# Patient Record
Sex: Female | Born: 1945
Health system: Southern US, Community
[De-identification: ages and names within clinical notes are randomized; demographics above are authoritative.]

## PROBLEM LIST (undated history)

## (undated) DIAGNOSIS — Z923 Personal history of irradiation: Secondary | ICD-10-CM

## (undated) DIAGNOSIS — T4145XA Adverse effect of unspecified anesthetic, initial encounter: Secondary | ICD-10-CM

## (undated) DIAGNOSIS — C7951 Secondary malignant neoplasm of bone: Secondary | ICD-10-CM

## (undated) DIAGNOSIS — C50919 Malignant neoplasm of unspecified site of unspecified female breast: Secondary | ICD-10-CM

## (undated) DIAGNOSIS — Z9889 Other specified postprocedural states: Secondary | ICD-10-CM

## (undated) DIAGNOSIS — G4700 Insomnia, unspecified: Secondary | ICD-10-CM

## (undated) DIAGNOSIS — T8859XA Other complications of anesthesia, initial encounter: Secondary | ICD-10-CM

## (undated) DIAGNOSIS — C4490 Unspecified malignant neoplasm of skin, unspecified: Secondary | ICD-10-CM

## (undated) DIAGNOSIS — Z1379 Encounter for other screening for genetic and chromosomal anomalies: Secondary | ICD-10-CM

## (undated) DIAGNOSIS — I10 Essential (primary) hypertension: Secondary | ICD-10-CM

## (undated) DIAGNOSIS — R350 Frequency of micturition: Secondary | ICD-10-CM

## (undated) DIAGNOSIS — R42 Dizziness and giddiness: Secondary | ICD-10-CM

## (undated) DIAGNOSIS — D649 Anemia, unspecified: Secondary | ICD-10-CM

## (undated) DIAGNOSIS — H269 Unspecified cataract: Secondary | ICD-10-CM

## (undated) DIAGNOSIS — Z87440 Personal history of urinary (tract) infections: Secondary | ICD-10-CM

## (undated) DIAGNOSIS — F419 Anxiety disorder, unspecified: Secondary | ICD-10-CM

## (undated) DIAGNOSIS — K649 Unspecified hemorrhoids: Secondary | ICD-10-CM

## (undated) DIAGNOSIS — E785 Hyperlipidemia, unspecified: Secondary | ICD-10-CM

## (undated) DIAGNOSIS — R112 Nausea with vomiting, unspecified: Secondary | ICD-10-CM

## (undated) DIAGNOSIS — M25561 Pain in right knee: Secondary | ICD-10-CM

## (undated) DIAGNOSIS — M25511 Pain in right shoulder: Secondary | ICD-10-CM

## (undated) DIAGNOSIS — A048 Other specified bacterial intestinal infections: Secondary | ICD-10-CM

## (undated) DIAGNOSIS — Z8719 Personal history of other diseases of the digestive system: Secondary | ICD-10-CM

## (undated) DIAGNOSIS — G629 Polyneuropathy, unspecified: Secondary | ICD-10-CM

## (undated) DIAGNOSIS — K219 Gastro-esophageal reflux disease without esophagitis: Secondary | ICD-10-CM

## (undated) HISTORY — DX: Pain in right shoulder: M25.511

## (undated) HISTORY — DX: Unspecified malignant neoplasm of skin, unspecified: C44.90

## (undated) HISTORY — DX: Gastro-esophageal reflux disease without esophagitis: K21.9

## (undated) HISTORY — DX: Pain in right knee: M25.561

## (undated) HISTORY — DX: Encounter for other screening for genetic and chromosomal anomalies: Z13.79

## (undated) HISTORY — DX: Personal history of irradiation: Z92.3

## (undated) HISTORY — DX: Secondary malignant neoplasm of bone: C79.51

## (undated) HISTORY — PX: ESOPHAGOGASTRODUODENOSCOPY: SHX1529

## (undated) HISTORY — PX: MASTECTOMY, RADICAL: SHX710

## (undated) HISTORY — DX: Personal history of urinary (tract) infections: Z87.440

## (undated) HISTORY — PX: COLONOSCOPY: SHX174

## (undated) HISTORY — DX: Malignant neoplasm of unspecified site of unspecified female breast: C50.919

---

## 1968-05-09 HISTORY — PX: OTHER SURGICAL HISTORY: SHX169

## 1999-04-20 ENCOUNTER — Encounter: Payer: Self-pay | Admitting: *Deleted

## 1999-04-20 ENCOUNTER — Ambulatory Visit (HOSPITAL_COMMUNITY): Admission: RE | Admit: 1999-04-20 | Discharge: 1999-04-20 | Payer: Self-pay | Admitting: *Deleted

## 2002-05-09 HISTORY — PX: OTHER SURGICAL HISTORY: SHX169

## 2003-05-21 ENCOUNTER — Emergency Department (HOSPITAL_COMMUNITY): Admission: EM | Admit: 2003-05-21 | Discharge: 2003-05-21 | Payer: Self-pay | Admitting: Emergency Medicine

## 2003-09-18 ENCOUNTER — Other Ambulatory Visit: Admission: RE | Admit: 2003-09-18 | Discharge: 2003-09-18 | Payer: Self-pay | Admitting: Family Medicine

## 2004-08-25 ENCOUNTER — Other Ambulatory Visit: Admission: RE | Admit: 2004-08-25 | Discharge: 2004-08-25 | Payer: Self-pay | Admitting: Family Medicine

## 2005-10-24 ENCOUNTER — Other Ambulatory Visit: Admission: RE | Admit: 2005-10-24 | Discharge: 2005-10-24 | Payer: Self-pay | Admitting: Family Medicine

## 2006-09-27 ENCOUNTER — Other Ambulatory Visit: Admission: RE | Admit: 2006-09-27 | Discharge: 2006-09-27 | Payer: Self-pay | Admitting: Family Medicine

## 2007-10-03 ENCOUNTER — Other Ambulatory Visit: Admission: RE | Admit: 2007-10-03 | Discharge: 2007-10-03 | Payer: Self-pay | Admitting: Family Medicine

## 2008-10-03 ENCOUNTER — Other Ambulatory Visit: Admission: RE | Admit: 2008-10-03 | Discharge: 2008-10-03 | Payer: Self-pay | Admitting: Family Medicine

## 2011-10-20 ENCOUNTER — Other Ambulatory Visit (HOSPITAL_COMMUNITY)
Admission: RE | Admit: 2011-10-20 | Discharge: 2011-10-20 | Disposition: A | Discharge status: Home / Self Care | Payer: Medicare Other | Source: Ambulatory Visit | Attending: Family Medicine | Admitting: Family Medicine

## 2011-10-20 DIAGNOSIS — Z124 Encounter for screening for malignant neoplasm of cervix: Secondary | ICD-10-CM | POA: Insufficient documentation

## 2011-10-21 ENCOUNTER — Other Ambulatory Visit: Payer: Self-pay | Admitting: Family Medicine

## 2012-03-05 ENCOUNTER — Other Ambulatory Visit: Payer: Self-pay | Admitting: Radiology

## 2012-03-06 ENCOUNTER — Other Ambulatory Visit: Payer: Self-pay | Admitting: Radiology

## 2012-03-06 DIAGNOSIS — C50911 Malignant neoplasm of unspecified site of right female breast: Secondary | ICD-10-CM

## 2012-03-08 ENCOUNTER — Telehealth: Payer: Self-pay | Admitting: *Deleted

## 2012-03-08 DIAGNOSIS — C50411 Malignant neoplasm of upper-outer quadrant of right female breast: Secondary | ICD-10-CM | POA: Insufficient documentation

## 2012-03-08 DIAGNOSIS — C50419 Malignant neoplasm of upper-outer quadrant of unspecified female breast: Secondary | ICD-10-CM

## 2012-03-08 NOTE — Telephone Encounter (Signed)
Confirmed [facility] for 03/14/12 at 1200 .  Instructions and contact information given.

## 2012-03-12 ENCOUNTER — Ambulatory Visit
Admission: RE | Admit: 2012-03-12 | Discharge: 2012-03-12 | Disposition: A | Discharge status: Home / Self Care | Payer: Medicare Other | Source: Ambulatory Visit | Attending: Radiology | Admitting: Radiology

## 2012-03-12 DIAGNOSIS — C50911 Malignant neoplasm of unspecified site of right female breast: Secondary | ICD-10-CM

## 2012-03-12 MED ORDER — GADOBENATE DIMEGLUMINE 529 MG/ML IV SOLN
12.0000 mL | Freq: Once | INTRAVENOUS | Status: AC | PRN
  Administered 2012-03-12: 12 mL via INTRAVENOUS

## 2012-03-14 ENCOUNTER — Encounter: Payer: Self-pay | Admitting: Oncology

## 2012-03-14 ENCOUNTER — Other Ambulatory Visit (HOSPITAL_BASED_OUTPATIENT_CLINIC_OR_DEPARTMENT_OTHER): Payer: Medicare Other | Admitting: Lab

## 2012-03-14 ENCOUNTER — Ambulatory Visit
Admission: RE | Admit: 2012-03-14 | Discharge: 2012-03-14 | Disposition: A | Discharge status: Home / Self Care | Payer: Medicare Other | Source: Ambulatory Visit | Attending: Radiation Oncology | Admitting: Radiation Oncology

## 2012-03-14 ENCOUNTER — Ambulatory Visit (HOSPITAL_BASED_OUTPATIENT_CLINIC_OR_DEPARTMENT_OTHER): Payer: Medicare Other | Admitting: General Surgery

## 2012-03-14 ENCOUNTER — Telehealth: Payer: Self-pay | Admitting: *Deleted

## 2012-03-14 ENCOUNTER — Ambulatory Visit (HOSPITAL_BASED_OUTPATIENT_CLINIC_OR_DEPARTMENT_OTHER): Payer: Medicare Other | Admitting: Oncology

## 2012-03-14 ENCOUNTER — Encounter: Payer: Self-pay | Admitting: *Deleted

## 2012-03-14 ENCOUNTER — Ambulatory Visit: Payer: Medicare Other | Attending: General Surgery | Admitting: Physical Therapy

## 2012-03-14 ENCOUNTER — Ambulatory Visit: Payer: Medicare Other

## 2012-03-14 ENCOUNTER — Encounter (INDEPENDENT_AMBULATORY_CARE_PROVIDER_SITE_OTHER): Payer: Self-pay | Admitting: General Surgery

## 2012-03-14 VITALS — BP 165/88 | HR 81 | Temp 98.9°F | Resp 20 | Ht 63.5 in | Wt 135.9 lb

## 2012-03-14 DIAGNOSIS — C50419 Malignant neoplasm of upper-outer quadrant of unspecified female breast: Secondary | ICD-10-CM

## 2012-03-14 DIAGNOSIS — IMO0001 Reserved for inherently not codable concepts without codable children: Secondary | ICD-10-CM | POA: Insufficient documentation

## 2012-03-14 DIAGNOSIS — M25619 Stiffness of unspecified shoulder, not elsewhere classified: Secondary | ICD-10-CM | POA: Insufficient documentation

## 2012-03-14 DIAGNOSIS — C773 Secondary and unspecified malignant neoplasm of axilla and upper limb lymph nodes: Secondary | ICD-10-CM

## 2012-03-14 DIAGNOSIS — M25519 Pain in unspecified shoulder: Secondary | ICD-10-CM | POA: Insufficient documentation

## 2012-03-14 DIAGNOSIS — R293 Abnormal posture: Secondary | ICD-10-CM | POA: Insufficient documentation

## 2012-03-14 DIAGNOSIS — Z170 Estrogen receptor positive status [ER+]: Secondary | ICD-10-CM

## 2012-03-14 LAB — COMPREHENSIVE METABOLIC PANEL (CC13)
ALT: 19 U/L (ref 0–55)
AST: 24 U/L (ref 5–34)
Albumin: 4 g/dL (ref 3.5–5.0)
Alkaline Phosphatase: 83 U/L (ref 40–150)
BUN: 14 mg/dL (ref 7.0–26.0)
CO2: 30 mEq/L — ABNORMAL HIGH (ref 22–29)
Calcium: 10.1 mg/dL (ref 8.4–10.4)
Chloride: 100 mEq/L (ref 98–107)
Creatinine: 1 mg/dL (ref 0.6–1.1)
Glucose: 154 mg/dl — ABNORMAL HIGH (ref 70–99)
Potassium: 3.6 mEq/L (ref 3.5–5.1)
Sodium: 137 mEq/L (ref 136–145)
Total Bilirubin: 0.7 mg/dL (ref 0.20–1.20)
Total Protein: 7.7 g/dL (ref 6.4–8.3)

## 2012-03-14 LAB — CBC WITH DIFFERENTIAL/PLATELET
BASO%: 0.9 % (ref 0.0–2.0)
Basophils Absolute: 0.1 10*3/uL (ref 0.0–0.1)
EOS%: 0.8 % (ref 0.0–7.0)
Eosinophils Absolute: 0 10*3/uL (ref 0.0–0.5)
HCT: 39.8 % (ref 34.8–46.6)
HGB: 13.5 g/dL (ref 11.6–15.9)
LYMPH%: 22.8 % (ref 14.0–49.7)
MCH: 29.5 pg (ref 25.1–34.0)
MCHC: 33.8 g/dL (ref 31.5–36.0)
MCV: 87.2 fL (ref 79.5–101.0)
MONO#: 0.3 10*3/uL (ref 0.1–0.9)
MONO%: 5 % (ref 0.0–14.0)
NEUT#: 4 10*3/uL (ref 1.5–6.5)
NEUT%: 70.5 % (ref 38.4–76.8)
Platelets: 176 10*3/uL (ref 145–400)
RBC: 4.56 10*6/uL (ref 3.70–5.45)
RDW: 13.2 % (ref 11.2–14.5)
WBC: 5.6 10*3/uL (ref 3.9–10.3)
lymph#: 1.3 10*3/uL (ref 0.9–3.3)

## 2012-03-14 NOTE — Progress Notes (Signed)
Heather [name] [id number] 10/28/1945 66 y.o. 03/14/2012 4:21 PM  CC Dr. Laurann Montana Dr. Chipper Herb Dr. Chevis Pretty  REASON FOR CONSULTATION:  66 year old female with new diagnosis of invasive ductal carcinoma favoring grade 2-3 at the 10:00 position with axillary lymph node positive for metastatic disease. Patient is seen in the multidisciplinary breast clinic for discussion of treatment options. Patient was seen in the Multidisciplinary Breast Clinic for discussion of her treatment options.   STAGE:   Cancer of upper-outer quadrant of female breast   Primary site: Breast (Right)   Staging method: AJCC 7th Edition   Clinical: Stage IIB (T2, N1, cM0)   Summary: Stage IIB (T2, N1, cM0)  REFERRING PHYSICIAN: Dr. Chevis Pretty  HISTORY OF PRESENT ILLNESS:  Heather [name] is a 66 y.o. female.  Would medical history significant for gastroesophageal reflux disease prior history of kidney disease stage III edema vitamin D deficiency urinary tract infections right shoulder pain history of dizziness. Patient had a screening mammogram on 03/01/2012 she was felt to have a possible mass within the upper-outer quadrant of the right breast along with an enlarged lymph node in the right axilla. She had ultrasound-guided biopsy of the right breast mass as well as the lymph node on 1028. The pathology showed an invasive mammary carcinoma consistent with ductal phenotype at the 10:00 position in the upper-outer quadrant of the right breast. The lymph node biopsy was consistent with metastatic disease. The tumor was ER +97% PR negative proliferation marker Ki-67 44% and HER-2/neu positive with a ratio of 3.65. On 03/12/2012 patient had an MRI of the breasts performed that showed 2.2 x 1.7 x 1.8 cm mass in the posterior third of the upper-outer quadrant there was an additional mass measuring 0.8 x 0.3 x 0.3 cm patchy non-masslike enhancement within the anterior third of the upper-outer quadrant of the right  breast. There was also felt to be enlarged level I and level II right axillary adenopathy. Patient is now seen in the multidisciplinary breast clinic for discussion of treatment options. She does have a history of dizziness for about 7 weeks and headaches for one to 2 weeks. She's also had right shoulder pain and has had corticosteroid injections without significant benefit. Her case was discussed at the multidisciplinary breast conference pathology and radiology were reviewed. Recommendations are based following NCC and guidelines for stage II HER-2 positive breast cancer.   Past Medical History: Past Medical History  Diagnosis Date  . Breast cancer   . GERD (gastroesophageal reflux disease)   . Skin cancer   . Chronic kidney disease   . Hx: UTI (urinary tract infection)   . Right shoulder pain   . Right knee pain     Past Surgical History: No past surgical history on file.  Family History: Family History  Problem Relation Age of Onset  . Leukemia Mother   . Colon cancer Brother   . Colon cancer Maternal Grandmother     Social History History  Substance Use Topics  . Smoking status: Never Smoker   . Smokeless tobacco: Not on file  . Alcohol Use: No    Allergies: Allergies  Allergen Reactions  . Codeine     " loopy"  . Keflex (Cephalexin)     GI Upset    Current Medications: Current Outpatient Prescriptions  Medication Sig Dispense Refill  . B Complex-C (SUPER B COMPLEX PO) Take 1 each by mouth daily.      . calcium carbonate (TUMS -  DOSED IN MG ELEMENTAL CALCIUM) 500 MG chewable tablet Chew 1 tablet by mouth daily.      . Cholecalciferol (VITAMIN D-3 PO) Take 4,000 Units by mouth daily.      . fish oil-omega-3 fatty acids 1000 MG capsule Take 1 g by mouth daily.      . magnesium gluconate (MAGONATE) 500 MG tablet Take 500 mg by mouth 2 (two) times daily.      Marland Kitchen omeprazole (PRILOSEC) 20 MG capsule Take 20 mg by mouth daily.      . Probiotic Product (PROBIOTIC DAILY  PO) Take 1 each by mouth.        OB/GYN History: Menarche at age 32 patient underwent menopause in 2004 she has never been on hormone replacement therapy first live birth was at age 58  Fertility Discussion: Not applicable Prior History of Cancer: Low-grade skin cancers otherwise no other diseases  Health Maintenance:  Colonoscopy 2012 Bone Density 2012  Last PAP smear June 2013  ECOG PERFORMANCE STATUS: 0 - Asymptomatic  Genetic Counseling/testing: Upon review of her family history we are not recommended genetic counseling or testing at this time.  REVIEW OF SYSTEMS:  A comprehensive review of systems was negative except for: Integument/breast: positive for breast lump Musculoskeletal: positive for arthralgias, bone pain, myalgias and stiff joints Neurological: positive for dizziness and headaches  PHYSICAL EXAMINATION: Blood pressure 165/88, pulse 81, temperature 98.9 F (37.2 C), temperature source Oral, resp. rate 20, height 5' 3.5" (1.613 m), weight 135 lb 14.4 oz (61.644 kg).  DGU:YQIHK, healthy and no distress SKIN: skin color, texture, turgor are normal HEAD: Normocephalic EYES: PERRLA, EOMI EARS: External ears normal OROPHARYNX:no exudate, no erythema and lips, buccal mucosa, and tongue normal  NECK: supple, no adenopathy LYMPH:  No palpable cervical or supraclavicular or left axillary adenopathy right axilla does reveal palpable lymph node BREAST: There is a palpable right breast mass from recent biopsy with palpable right axillary lymph node area of ecchymosis is noted there is tenderness in the right breast no masses or nipple discharge in the left breast LUNGS: clear to auscultation  HEART: regular rate & rhythm ABDOMEN:abdomen soft, non-tender, normal bowel sounds and no masses or organomegaly BACK: Back symmetric, no curvature., No CVA tenderness, Range of motion is normal EXTREMITIES:no edema, no clubbing, no cyanosis  NEURO: alert & oriented x 3 with fluent  speech, no focal motor/sensory deficits, gait normal     STUDIES/RESULTS: Mr Breast Bilateral W Wo Contrast  03/12/2012  *RADIOLOGY REPORT*  Clinical Data: Recently diagnosed invasive mammary carcinoma in the 10 o'clock region of the right breast with a metastatic right axillary lymph node.  BILATERAL BREAST MRI WITH AND WITHOUT CONTRAST  Technique: Multiplanar, multisequence MR images of both breasts were obtained prior to and following the intravenous administration of 12ml of multihance.  Three dimensional images were evaluated at the independent DynaCad workstation.  Comparison:  Mammograms dated 03/05/2012, 03/01/2012, 03/01/2011 and 02/26/2010 from [facility]  Findings: There is a moderate background parenchymal enhancement pattern.  Right breast: 1.  There is a lobulated 2.2 x 1.7 x 1.8 cm mass in the posterior third of the upper outer quadrant of the right breast.  There is a post biopsy clip artifact 1 cm lateral to the mass. 2.  In the anterior third upper outer quadrant of the right breast there is patchy non mass like enhancement measuring 0.8 x 0.3 x 0.3 cm.  Left breast:  There is no abnormal enhancement the left breast.  There are enlarged right axillary lymph nodes.  The largest right axillary lymph node measures 2.4 cm and is located lateral to the pectoralis minor consistent with level I adenopathy.  There is a 1.6 cm lymph node behind the pectoralis minor consistent with enlarged level II adenopathy.  IMPRESSION:  1.  2.2 cm mass in the posterior third of the upper outer quadrant of the right breast corresponding with the known invasive mammary carcinoma. 2.  Patchy non mass-like enhancement in the anterior third of the upper outer quadrant of the right breast.  MR guided core biopsy is recommended. 3. Enlarged level I and II right axillary adenopathy.  RECOMMENDATION: MR guided core biopsy of the right breast.  THREE-DIMENSIONAL MR IMAGE RENDERING ON INDEPENDENT WORKSTATION:   Three-dimensional MR images were rendered by post-processing of the original MR data on an independent workstation.  The three- dimensional MR images were interpreted, and findings were reported in the accompanying complete MRI report for this study.  BI-RADS CATEGORY 4:  Suspicious abnormality - biopsy should be considered.   Original Report Authenticated By: Baird Lyons, M.D.      LABS:    Chemistry      Component Value Date/Time   NA 137 03/14/2012 1227   K 3.6 03/14/2012 1227   CL 100 03/14/2012 1227   CO2 30* 03/14/2012 1227   BUN 14.0 03/14/2012 1227   CREATININE 1.0 03/14/2012 1227      Component Value Date/Time   CALCIUM 10.1 03/14/2012 1227   ALKPHOS 83 03/14/2012 1227   AST 24 03/14/2012 1227   ALT 19 03/14/2012 1227   BILITOT 0.70 03/14/2012 1227      Lab Results  Component Value Date   WBC 5.6 03/14/2012   HGB 13.5 03/14/2012   HCT 39.8 03/14/2012   MCV 87.2 03/14/2012   PLT 176 03/14/2012       PATHOLOGY: ADDITIONAL INFORMATION: 2. PROGNOSTIC INDICATORS - ACIS Results IMMUNOHISTOCHEMICAL AND MORPHOMETRIC ANALYSIS BY THE AUTOMATED CELLULAR IMAGING SYSTEM (ACIS) Estrogen Receptor (Negative, <1%): 97%, STRONG STAINING INTENSITY Progesterone Receptor (Negative, <1%): 0%, NEGATIVE Proliferation Marker Ki67 by M IB-1 (Low<20%): 44% COMMENT: The negative hormone receptor study(ies) in this case have no internal positive control. All controls stained appropriately Pecola Leisure MD Pathologist, Electronic Signature ( Signed 03/09/2012) 2. CHROMOGENIC IN-SITU HYBRIDIZATION Interpretation: HER2/NEU BY CISH - SHOWS AMPLIFICATION BY CISH ANALYSIS. THE RATIO OF HER2: CEP 17 SIGNALS WAS 3.65 Reference range: Ratio: HER2:CEP17 < 1.8 gene amplification not observed Ratio: HER2:CEP 17 1.8-2.2 - equivocal result 1 of 3 FINAL for Heather [name], Heather [name] ([id number]) ADDITIONAL INFORMATION:(continued) Ratio: HER2:CEP17 > 2.2 - gene amplification observed Pecola Leisure MD Pathologist,  Electronic Signature ( Signed 03/09/2012) FINAL DIAGNOSIS Diagnosis 1. Breast, right, needle core biopsy, mass, 10 o'clock, UOQ - INVASIVE MAMMARY CARCINOMA. - SEE COMMENT. 2. Lymph node, needle/core biopsy, axilla - METASTATIC CARCINOMA IN 1 OF 1 LYMPH NODE (1/1). - SEE COMMENT. Microscopic Comment 1. The features favor grade II-III invasive ductal carcinoma. The results were called to [facility] on 03/06/2012. 2. The breast prognostic profile will be performed on part II due to a greater amount of tumor present. (JBK:gt, 03/06/12) Pecola Leisure MD Pathologist, Electronic Signature (Case signed 03/06/2012)   ASSESSMENT    66 year old female with new diagnosis of invasive mammary carcinoma of the right breast at the 10:00 position with one metastatic lymph node. The features favor a grade 2 or 3 invasive ductal carcinoma tumor is ER positive PR negative HER-2/neu positive. On MRI  patient is noted to have a second mass concerning for a malignancy. Patient also has several week history of dizziness. Patient was seen in the [facility] clinic for discussion of her treatment options. We discussed several options one is to biopsy the anterior mass to determine whether this was a malignancy or a benign process. Surgical options were discussed with the patient regarding lump at the mean versus a mastectomy with axillary lymph node dissection. I think the type of surgical intervention may depend on what the results of the biopsy are of the anterior mass. Patient and I discussed chemotherapy therapy. Because patient's tumor is HER-2/neu positive she would be a good candidate for HER-2 based chemotherapy therapy. If patient were to get neoadjuvant treatment and I would treat her with Herceptin perjeta and Taxotere. However if she were to get a mastectomy of front then adjuvantly I would treat her with Taxotere carboplatinum and Herceptin. I do think the final decision will be made based on patient's  biopsy. Patient was seen by radiation oncology and again she most likely would need post mastectomy and or post lumpectomy radiation. We did recommend staging scans and I will get the schedule for the patient.     PLAN:    #1 patient will proceed with biopsy of the anterior breast mass. Based on this we will make our final decision regarding whether or not she will receive neoadjuvant or adjuvant chemotherapy. Her surgery will also be based on results of this biopsy.  #2 patient will be scheduled for staging scans including MRI of the brain and a PET scan.  #3 because patient will get chemotherapy I will set her up for chemotherapy teaching class as well as Port-A-Cath placement and an echocardiogram.  #4 I will see her back in about 4 weeks' time for sooner if need arises.       Thank you so much for allowing me to participate in the care of Heather [name]. I will continue to follow up the patient with you and assist in her care.  All questions were answered. The patient knows to call the clinic with any problems, questions or concerns. We can certainly see the patient much sooner if necessary.  I spent 60 minutes counseling the patient face to face. The total time spent in the appointment was 60 minutes.   Drue Second, MD Medical/Oncology [facility] [phone] (beeper) [phone] (Office)  03/14/2012, 4:21 PM

## 2012-03-14 NOTE — Telephone Encounter (Signed)
Gave patient appointment for 03-26-2012 chemo education  Gave patient apppointment for lab and md 04-04-2012  Gave patietn appointment for 04-17-2012 for the heart and vascular at [facility]  Gave patient appointment for 03-22-2012 pet scan  Patient is aware of the the above appointments

## 2012-03-14 NOTE — Progress Notes (Signed)
[facility] Radiation Oncology NEW PATIENT EVALUATION  Name: Heather [name] MRN: [id number]  Date:   03/14/2012           DOB: Aug 04, 1945  Status: outpatient   CC:  Heather Askew, MD    REFERRING PHYSICIAN: Robyne Askew, MD   DIAGNOSIS:  Stage IIB (T2 N1 MX) invasive ductal carcinoma of the right breast  HISTORY OF PRESENT ILLNESS:  Heather [name] is a 66 y.o. female who is seen today for the courtesy of Dr. Chevis [name] at the [facility]. for evaluation of her stage IIB (T2 N1 MX) invasive ductal carcinoma of the right breast. At the time of a screening mammogram in 03/01/2012 she was felt to have a possible mass within the upper-outer quadrant of the right breast along with the question of enlarged lymph nodes in the right axilla. She underwent ultrasound-guided biopsies of the right breast mass and axillary node 03/05/2012. She was found to have invasive mammary carcinoma at 10:00 within the upper-outer quadrant of the right breast and also metastatic disease to a single lymph node. The tumor was strongly ER +97% and PR negative with an elevated proliferation marker/Ki-67 of 44%. There was amplification of HER-2/neu. Breast MR on 03/12/2012 showed a 2.2 x 1.7 x 1.8 cm mass in the posterior third of the upper-outer quadrant of the right breast. In addition, there was a 0.8 x 0.3 x 0.3 cm patchy non-masslike enhancement within the anterior third of the upper-outer quadrant of the right breast. She is also felt to have enlarged level I and II right axillary adenopathy. Of note is that she does give a history of dizziness for the past 7 weeks, and headaches for the past one to 2 weeks. Subsequently history of right shoulder pain for the past 2-3 months for which she was seen by Dr. Margreta [name] who gave her a corticosteroid injection without much benefit. He suggested that she may have a rotator cuff issue. She describes her pain is radiating to the elbow on occasion. She seen today with Heather [name] and Heather [name].   PREVIOUS RADIATION THERAPY: No   PAST MEDICAL HISTORY:  has a past medical history of Breast cancer; GERD (gastroesophageal reflux disease); Skin cancer; Chronic kidney disease; UTI (urinary tract infection); Right shoulder pain; and Right knee pain.     PAST SURGICAL HISTORY: No past surgical history on file.   FAMILY HISTORY: family history includes Colon cancer in her brother and maternal grandmother and Leukemia in her mother. Her mother died from leukemia at age 60. Her father died of a stroke at 7. No family history of breast cancer.   SOCIAL HISTORY:  reports that she has never smoked. She does not have any smokeless tobacco history on file. She reports that she does not drink alcohol or use illicit drugs. Married, 2 children. Retired Public librarian.   ALLERGIES: Codeine and Keflex   MEDICATIONS:  Current Outpatient Prescriptions  Medication Sig Dispense Refill  . B Complex-C (SUPER B COMPLEX PO) Take 1 each by mouth daily.      . calcium carbonate (TUMS - DOSED IN MG ELEMENTAL CALCIUM) 500 MG chewable tablet Chew 1 tablet by mouth daily.      . Cholecalciferol (VITAMIN D-3 PO) Take 4,000 Units by mouth daily.      . fish oil-omega-3 fatty acids 1000 MG capsule Take 1 g by mouth daily.      . magnesium gluconate (MAGONATE) 500  MG tablet Take 500 mg by mouth 2 (two) times daily.      Marland Kitchen omeprazole (PRILOSEC) 20 MG capsule Take 20 mg by mouth daily.      . Probiotic Product (PROBIOTIC DAILY PO) Take 1 each by mouth.         REVIEW OF SYSTEMS:  Pertinent items are noted in HPI.    PHYSICAL EXAM: Alert and oriented 66 year old white female appearing younger than her stated age. Wt Readings from Last 3 Encounters:  03/14/12 135 lb 14.4 oz (61.644 kg)   Temp Readings from Last 3 Encounters:  03/14/12 98.9 F (37.2 C) Oral   BP Readings from Last 3 Encounters:  03/14/12 165/88   Pulse Readings from Last 3 Encounters:  03/14/12 81    Head and neck examination: Grossly unremarkable. Nodes: Without palpable cervical, supraclavicular, or axillary lymphadenopathy. Chest: Lungs clear. Heart: Regular in rhythm. Back: Without spinal or CVA tenderness. Breasts: There is a palpable 2 similar mass at approximately 10:00 along the upper-outer quadrant of the right breast. There is a palpable 1.5 sentinel lymph node within the right axilla. There is a punctate biopsy wound at 9:00. There is a keloid from a biopsy this past August at 4:00. Left breast without masses or lesions. Abdomen: Without extremities: Without edema. Neurologic simulation: Grossly nonfocal. masses organomegaly.   LABORATORY DATA:  Lab Results  Component Value Date   WBC 5.6 03/14/2012   HGB 13.5 03/14/2012   HCT 39.8 03/14/2012   MCV 87.2 03/14/2012   PLT 176 03/14/2012   Lab Results  Component Value Date   NA 137 03/14/2012   K 3.6 03/14/2012   CL 100 03/14/2012   CO2 30* 03/14/2012   Lab Results  Component Value Date   ALT 19 03/14/2012   AST 24 03/14/2012   ALKPHOS 83 03/14/2012   BILITOT 0.70 03/14/2012      IMPRESSION:  STAGE IIB (T2 N1 MX) invasive ductal carcinoma of the right breast. Based on her history of dizziness and headaches, I  feel that she should have a MRI scan of her brain in the setting of metastatic disease to her axilla. We discussed local treatment options which include breast conservation or mastectomy. If she would like to consider breast preservation then she'll need a biopsy of her additional lesion within the upper-outer quadrant of the right breast. If this is benign that she may be a candidate for breast preservation. We also discussed the option of neoadjuvant chemotherapy and this can be decided between Heather [name] and Dr. Welton [name]. She also will need a staging workup with her known axillary adenopathy. I doubt that her shoulder pain is related to her axillary adenopathy. She will require an axillary dissection based on her MRI and axillary  biopsy findings. We discussed the potential acute and late toxicities of radiation therapy.    PLAN:  as discussed above. She'll be scheduled for an ultrasound-guided or MR guided biopsy of her additional mass within the upper-outer quadrant of the right breast.   I spent 40 minutes minutes face to face with the patient and more than 50% of that time was spent in counseling and/or coordination of care.

## 2012-03-14 NOTE — Progress Notes (Signed)
Subjective:     Patient ID: Heather [name], female   DOB: [birth year], 66 y.o.   MRN: [id number]  HPI We are asked to see the patient in consultation by Dr. Isabell Jarvis to evaluate her for a right breast cancer. The patient is a 10 six-year-old white female who recently went for her routine screening mammogram. She has chronic breast pain that occurs almost all the time which is worse on the left than the right. She states that caffeine makes the pain worse. She denies any discharge from her nipples. She denies taking any female hormones. Her recent mammogram showed an abnormality in the right breast. Her MRI estimated the size of this area to be 2.2 cm. The MRI also identified a second area anteriorly that was suspicious. Her biopsy showed an invasive ductal cancer. She also had an abnormal looking lymph node that was biopsied and was positive for breast cancer. She was ER positive and PR negative. She is HER-2 positive. Her Ki-67 was 44%. She does complain of some right shoulder pain. She also complains of dizziness for the last 6 or 7 weeks.  Review of Systems  Constitutional: Negative.   HENT: Negative.   Eyes: Negative.   Respiratory: Negative.   Cardiovascular: Negative.   Gastrointestinal: Negative.   Genitourinary: Negative.   Musculoskeletal: Positive for arthralgias.  Skin: Negative.   Neurological: Positive for dizziness.  Hematological: Negative.   Psychiatric/Behavioral: Negative.        Objective:   Physical Exam  Constitutional: She is oriented to person, place, and time. She appears well-developed and well-nourished.  HENT:  Head: Normocephalic and atraumatic.  Eyes: Conjunctivae normal and EOM are normal. Pupils are equal, round, and reactive to light.  Neck: Normal range of motion. Neck supple.  Cardiovascular: Normal rate, regular rhythm and normal heart sounds.   Pulmonary/Chest: Effort normal and breath sounds normal.       In the right breast she has a palpable skin  lesion medially that she states was a benign lesion that was recently removed. She has some palpable fullness laterally in the right breast with some bruising but it is difficult to distinguish a discrete mass. She also has some nodularity laterally in the left breast. She has a large palpable lymph node that is mobile in the right axilla. There is no palpable cervical or supraclavicular lymphadenopathy  Abdominal: Soft. Bowel sounds are normal. She exhibits no mass. There is no tenderness.  Musculoskeletal: Normal range of motion.  Lymphadenopathy:    She has no cervical adenopathy.  Neurological: She is alert and oriented to person, place, and time.  Skin: Skin is warm and dry.  Psychiatric: She has a normal mood and affect. Her behavior is normal.       Assessment:     The patient has at least a stage II right breast cancer with a positive lymph node. I have discussed the options for surgical treatment of her cancer in detail with her and she is unsure whether she would favor breast conservation and mastectomy. She would like to go ahead with the Second Look biopsy for the second lesion in the right breast and I think this is a reasonable thing to do. Because of her positive lymph node she will require a port for chemotherapy. I have discussed this in detail with her including the risks and benefits of placing the port and she understands    Plan:     We will plan to have a second look  biopsy done of the second area in the right breast and at that point we will have another conference with the family to decide on a final plan for her treatment.

## 2012-03-14 NOTE — Progress Notes (Signed)
Checked in new patient. No financial issues. °

## 2012-03-14 NOTE — Patient Instructions (Addendum)
Biopsy of additional area in the anterior of the breast  [facility] chemotherapy  Echocardiogram  MRI of brain  PET scan

## 2012-03-14 NOTE — Progress Notes (Signed)
Mailed after appt letter to pt. 

## 2012-03-15 ENCOUNTER — Other Ambulatory Visit: Payer: Self-pay | Admitting: Radiology

## 2012-03-15 DIAGNOSIS — R928 Other abnormal and inconclusive findings on diagnostic imaging of breast: Secondary | ICD-10-CM

## 2012-03-15 LAB — CANCER ANTIGEN 27.29: CA 27.29: 15 U/mL (ref 0–39)

## 2012-03-19 ENCOUNTER — Telehealth: Payer: Self-pay | Admitting: *Deleted

## 2012-03-19 NOTE — Telephone Encounter (Signed)
Patient confirmed over the phone the new date and time of the echo 

## 2012-03-20 ENCOUNTER — Ambulatory Visit
Admission: RE | Admit: 2012-03-20 | Discharge: 2012-03-20 | Disposition: A | Discharge status: Home / Self Care | Payer: Medicare Other | Source: Ambulatory Visit | Attending: Radiology | Admitting: Radiology

## 2012-03-20 ENCOUNTER — Telehealth: Payer: Self-pay | Admitting: *Deleted

## 2012-03-20 DIAGNOSIS — R928 Other abnormal and inconclusive findings on diagnostic imaging of breast: Secondary | ICD-10-CM

## 2012-03-20 MED ORDER — GADOBENATE DIMEGLUMINE 529 MG/ML IV SOLN
12.0000 mL | Freq: Once | INTRAVENOUS | Status: AC | PRN
  Administered 2012-03-20: 12 mL via INTRAVENOUS

## 2012-03-20 NOTE — Telephone Encounter (Signed)
Pt return call.  Spoke to her about [facility] from 03/14/12.  Pt denies questions or concerns regarding dx or treatment care plan.  Confirmed future appts.  Informed pt that bx results will be signed out tomorrow and she will be called by the radiologist.  Informed pt that I will inform her physician team of bx results.  Encourage pt to call with further needs.  Received verbal understanding.  Contact information given.

## 2012-03-20 NOTE — Telephone Encounter (Signed)
Left vm for pt to return call regarding [facility] from 03/14/12. 

## 2012-03-21 ENCOUNTER — Ambulatory Visit (HOSPITAL_COMMUNITY): Payer: Medicare Other

## 2012-03-21 ENCOUNTER — Telehealth (INDEPENDENT_AMBULATORY_CARE_PROVIDER_SITE_OTHER): Payer: Self-pay | Admitting: General Surgery

## 2012-03-21 NOTE — Telephone Encounter (Signed)
Paged Dr.Toth per Dr.Jackson at the [facility] to inform him that the patient's biopsy from 03/20/12 came back positive and Dr.Jackson just wanted to let Dr.Toth know this

## 2012-03-22 ENCOUNTER — Encounter (HOSPITAL_COMMUNITY)
Admission: RE | Admit: 2012-03-22 | Discharge: 2012-03-22 | Disposition: A | Discharge status: Home / Self Care | Payer: Medicare Other | Source: Ambulatory Visit | Attending: Oncology | Admitting: Oncology

## 2012-03-22 ENCOUNTER — Encounter (HOSPITAL_COMMUNITY): Payer: Self-pay

## 2012-03-22 ENCOUNTER — Ambulatory Visit (HOSPITAL_COMMUNITY)
Admission: RE | Admit: 2012-03-22 | Discharge: 2012-03-22 | Disposition: A | Discharge status: Home / Self Care | Payer: Medicare Other | Source: Ambulatory Visit | Attending: Oncology | Admitting: Oncology

## 2012-03-22 DIAGNOSIS — C50919 Malignant neoplasm of unspecified site of unspecified female breast: Secondary | ICD-10-CM | POA: Insufficient documentation

## 2012-03-22 DIAGNOSIS — C50419 Malignant neoplasm of upper-outer quadrant of unspecified female breast: Secondary | ICD-10-CM | POA: Insufficient documentation

## 2012-03-22 DIAGNOSIS — C773 Secondary and unspecified malignant neoplasm of axilla and upper limb lymph nodes: Secondary | ICD-10-CM | POA: Insufficient documentation

## 2012-03-22 LAB — GLUCOSE, CAPILLARY: Glucose-Capillary: 100 mg/dL — ABNORMAL HIGH (ref 70–99)

## 2012-03-22 MED ORDER — FLUDEOXYGLUCOSE F - 18 (FDG) INJECTION
18.6000 | Freq: Once | INTRAVENOUS | Status: AC | PRN
  Administered 2012-03-22: 18.6 via INTRAVENOUS

## 2012-03-22 MED ORDER — GADOBENATE DIMEGLUMINE 529 MG/ML IV SOLN
15.0000 mL | Freq: Once | INTRAVENOUS | Status: AC | PRN
  Administered 2012-03-22: 12 mL via INTRAVENOUS

## 2012-03-23 ENCOUNTER — Ambulatory Visit (HOSPITAL_COMMUNITY): Admission: RE | Admit: 2012-03-23 | Payer: Medicare Other | Source: Ambulatory Visit

## 2012-03-26 ENCOUNTER — Other Ambulatory Visit: Payer: Medicare Other

## 2012-03-28 ENCOUNTER — Ambulatory Visit (HOSPITAL_COMMUNITY)
Admission: RE | Admit: 2012-03-28 | Discharge: 2012-03-28 | Disposition: A | Discharge status: Home / Self Care | Payer: Medicare Other | Source: Ambulatory Visit | Attending: Oncology | Admitting: Oncology

## 2012-03-28 DIAGNOSIS — D4989 Neoplasm of unspecified behavior of other specified sites: Secondary | ICD-10-CM

## 2012-03-28 DIAGNOSIS — C50419 Malignant neoplasm of upper-outer quadrant of unspecified female breast: Secondary | ICD-10-CM

## 2012-03-28 DIAGNOSIS — C50919 Malignant neoplasm of unspecified site of unspecified female breast: Secondary | ICD-10-CM | POA: Insufficient documentation

## 2012-03-28 NOTE — Progress Notes (Signed)
  Echocardiogram 2D Echocardiogram has been performed.  Geramy Lamorte FRANCES 03/28/2012, 6:22 PM

## 2012-03-29 ENCOUNTER — Telehealth: Payer: Self-pay | Admitting: *Deleted

## 2012-03-29 NOTE — Telephone Encounter (Signed)
Pt r/s to see Dr. Welton Flakes on 03/30/12 to discuss treatment care options.  Pt confused on if she should do neoadjuvant treatment first or surgery first.  Informed pt she will discuss this with Dr. Welton Flakes tomorrow.  Pt confirmed new appt date and time.

## 2012-03-30 ENCOUNTER — Encounter: Payer: Self-pay | Admitting: Oncology

## 2012-03-30 ENCOUNTER — Telehealth: Payer: Self-pay | Admitting: *Deleted

## 2012-03-30 ENCOUNTER — Ambulatory Visit (HOSPITAL_BASED_OUTPATIENT_CLINIC_OR_DEPARTMENT_OTHER): Payer: Medicare Other | Admitting: Oncology

## 2012-03-30 ENCOUNTER — Other Ambulatory Visit (HOSPITAL_BASED_OUTPATIENT_CLINIC_OR_DEPARTMENT_OTHER): Payer: Medicare Other

## 2012-03-30 VITALS — BP 156/83 | HR 72 | Temp 98.2°F | Resp 20 | Ht 63.5 in | Wt 135.4 lb

## 2012-03-30 DIAGNOSIS — C50419 Malignant neoplasm of upper-outer quadrant of unspecified female breast: Secondary | ICD-10-CM

## 2012-03-30 DIAGNOSIS — C50919 Malignant neoplasm of unspecified site of unspecified female breast: Secondary | ICD-10-CM

## 2012-03-30 DIAGNOSIS — C773 Secondary and unspecified malignant neoplasm of axilla and upper limb lymph nodes: Secondary | ICD-10-CM

## 2012-03-30 DIAGNOSIS — Z170 Estrogen receptor positive status [ER+]: Secondary | ICD-10-CM

## 2012-03-30 LAB — CBC WITH DIFFERENTIAL/PLATELET
BASO%: 1.3 % (ref 0.0–2.0)
Basophils Absolute: 0.1 10*3/uL (ref 0.0–0.1)
EOS%: 1.1 % (ref 0.0–7.0)
Eosinophils Absolute: 0 10*3/uL (ref 0.0–0.5)
HCT: 42.7 % (ref 34.8–46.6)
HGB: 14.5 g/dL (ref 11.6–15.9)
LYMPH%: 26.4 % (ref 14.0–49.7)
MCH: 29.9 pg (ref 25.1–34.0)
MCHC: 34.1 g/dL (ref 31.5–36.0)
MCV: 87.8 fL (ref 79.5–101.0)
MONO#: 0.4 10*3/uL (ref 0.1–0.9)
MONO%: 8.2 % (ref 0.0–14.0)
NEUT#: 2.7 10*3/uL (ref 1.5–6.5)
NEUT%: 63 % (ref 38.4–76.8)
Platelets: 165 10*3/uL (ref 145–400)
RBC: 4.86 10*6/uL (ref 3.70–5.45)
RDW: 13 % (ref 11.2–14.5)
WBC: 4.3 10*3/uL (ref 3.9–10.3)
lymph#: 1.1 10*3/uL (ref 0.9–3.3)

## 2012-03-30 LAB — COMPREHENSIVE METABOLIC PANEL (CC13)
ALT: 23 U/L (ref 0–55)
AST: 26 U/L (ref 5–34)
Albumin: 4.1 g/dL (ref 3.5–5.0)
Alkaline Phosphatase: 96 U/L (ref 40–150)
BUN: 21 mg/dL (ref 7.0–26.0)
CO2: 31 mEq/L — ABNORMAL HIGH (ref 22–29)
Calcium: 10.2 mg/dL (ref 8.4–10.4)
Chloride: 101 mEq/L (ref 98–107)
Creatinine: 0.9 mg/dL (ref 0.6–1.1)
Glucose: 72 mg/dl (ref 70–99)
Potassium: 3.4 mEq/L — ABNORMAL LOW (ref 3.5–5.1)
Sodium: 140 mEq/L (ref 136–145)
Total Bilirubin: 0.8 mg/dL (ref 0.20–1.20)
Total Protein: 7.7 g/dL (ref 6.4–8.3)

## 2012-03-30 NOTE — Telephone Encounter (Signed)
Gave patient appointment for 04-19-2012 starting at 9:00am gave patient appointment for injection 04-20-2012 at 8:45am

## 2012-03-30 NOTE — Telephone Encounter (Signed)
Per staff message and POF I have scheduled appt.  JMW  

## 2012-03-30 NOTE — Progress Notes (Signed)
OFFICE PROGRESS NOTE  CC  [name], MD [address] [place] [address] Dr. Chevis Pretty Dr. Chipper Herb  DIAGNOSIS: 66 year old female with invasive ductal carcinoma grade 2-3 at the 10:00 position with positive lymph node for metastatic disease.  PRIOR THERAPY:  #1 patient was originally seen in the multidisciplinary breast clinic on 03/14/2012 for new diagnosis of invasive ductal carcinoma that was HER-2 positive node-positive, ER positive PR negative with Ki-67 of 44%  #2 on MRI patient also had an anterior breast mass that was to be biopsied to decide whether or not she should receive neoadjuvant chemotherapy or adjuvant chemotherapy. Patient's mass was biopsied and it was positive for a malignancy that was HER-2/neu negative.  CURRENT THERAPY: Patient is awaiting definitive surgery  INTERVAL HISTORY: Heather [name] 66 y.o. female returns for followup visit to discuss her biopsy results as well as to decide whether or not she wants to do breast conservation versus a mastectomy. Her case was discussed at the multidisciplinary breast conference as well. Patient has also spoken to Dr. Chevis Pretty. Today the patient and I discussed extensively her second biopsy results as well as her first biopsy results. We went over the pros and cons of doing surgery up front versus doing chemotherapy first followed by surgery. Patient is quite anxious to get something done. After an extensive discussion in which her husband was very active she has opted for a mastectomy. She otherwise is without any other complaints  MEDICAL HISTORY: Past Medical History  Diagnosis Date  . Breast cancer   . GERD (gastroesophageal reflux disease)   . Skin cancer   . Chronic kidney disease   . Hx: UTI (urinary tract infection)   . Right shoulder pain   . Right knee pain     ALLERGIES:  is allergic to codeine and keflex.  MEDICATIONS:  Current Outpatient Prescriptions  Medication Sig Dispense  Refill  . B Complex-C (SUPER B COMPLEX PO) Take 1 each by mouth daily.      . calcium carbonate (TUMS - DOSED IN MG ELEMENTAL CALCIUM) 500 MG chewable tablet Chew 1 tablet by mouth daily.      . Cholecalciferol (VITAMIN D-3 PO) Take 4,000 Units by mouth daily.      . magnesium gluconate (MAGONATE) 500 MG tablet Take 500 mg by mouth 2 (two) times daily.      . Probiotic Product (PROBIOTIC DAILY PO) Take 1 each by mouth.      . fish oil-omega-3 fatty acids 1000 MG capsule Take 1 g by mouth daily.      Marland Kitchen omeprazole (PRILOSEC) 20 MG capsule Take 20 mg by mouth daily.        SURGICAL HISTORY: No past surgical history on file.  REVIEW OF SYSTEMS:  Pertinent items are noted in HPI.   HEALTH MAINTENANCE: PHYSICAL EXAMINATION: Blood pressure 156/83, pulse 72, temperature 98.2 F (36.8 C), temperature source Oral, resp. rate 20, height 5' 3.5" (1.613 m), weight 135 lb 6.4 oz (61.417 kg). Body mass index is 23.61 kg/(m^2). ECOG PERFORMANCE STATUS: 0 - Asymptomatic Exam was deferred     LABORATORY DATA: Lab Results  Component Value Date   WBC 4.3 03/30/2012   HGB 14.5 03/30/2012   HCT 42.7 03/30/2012   MCV 87.8 03/30/2012   PLT 165 03/30/2012      Chemistry      Component Value Date/Time   NA 140 03/30/2012 0824   K 3.4* 03/30/2012 0824   CL 101 03/30/2012  0824   CO2 31* 03/30/2012 0824   BUN 21.0 03/30/2012 0824   CREATININE 0.9 03/30/2012 0824      Component Value Date/Time   CALCIUM 10.2 03/30/2012 0824   ALKPHOS 96 03/30/2012 0824   AST 26 03/30/2012 0824   ALT 23 03/30/2012 0824   BILITOT 0.80 03/30/2012 0824       RADIOGRAPHIC STUDIES:  Mr Laqueta Jean ZO Contrast  03/23/2012  *RADIOLOGY REPORT*  Clinical Data: New diagnosis breast cancer.  Rule out metastatic disease.  MRI HEAD WITHOUT AND WITH CONTRAST  Technique:  Multiplanar, multiecho pulse sequences of the brain and surrounding structures were obtained according to standard protocol without and with intravenous  contrast  Contrast: 12mL MULTIHANCE GADOBENATE DIMEGLUMINE 529 MG/ML IV SOLN  Comparison: None.  Findings: Ventricle size is normal.  Small 4 mm hyperintensities in the cerebral white matter bilaterally do not enhance and are most likely due to chronic microvascular ischemia.  Negative for acute infarct.  Diffusion weighted imaging is normal.  Pituitary is normal in size.  Negative for intracranial hemorrhage.  Following contrast infusion, no enhancing lesions are identified.  Paranasal sinuses are clear.  Vessels at the base of the brain are patent.  IMPRESSION: Negative for metastatic disease.  No acute abnormality.   Original Report Authenticated By: Janeece Riggers, M.D.    Mr Breast Bilateral W Wo Contrast  03/12/2012  *RADIOLOGY REPORT*  Clinical Data: Recently diagnosed invasive mammary carcinoma in the 10 o'clock region of the right breast with a metastatic right axillary lymph node.  BILATERAL BREAST MRI WITH AND WITHOUT CONTRAST  Technique: Multiplanar, multisequence MR images of both breasts were obtained prior to and following the intravenous administration of 12ml of multihance.  Three dimensional images were evaluated at the independent DynaCad workstation.  Comparison:  Mammograms dated 03/05/2012, 03/01/2012, 03/01/2011 and 02/26/2010 from [facility].  Findings: There is a moderate background parenchymal enhancement pattern.  Right breast: 1.  There is a lobulated 2.2 x 1.7 x 1.8 cm mass in the posterior third of the upper outer quadrant of the right breast.  There is a post biopsy clip artifact 1 cm lateral to the mass. 2.  In the anterior third upper outer quadrant of the right breast there is patchy non mass like enhancement measuring 0.8 x 0.3 x 0.3 cm.  Left breast:  There is no abnormal enhancement the left breast.  There are enlarged right axillary lymph nodes.  The largest right axillary lymph node measures 2.4 cm and is located lateral to the pectoralis minor consistent with level I  adenopathy.  There is a 1.6 cm lymph node behind the pectoralis minor consistent with enlarged level II adenopathy.  IMPRESSION:  1.  2.2 cm mass in the posterior third of the upper outer quadrant of the right breast corresponding with the known invasive mammary carcinoma. 2.  Patchy non mass-like enhancement in the anterior third of the upper outer quadrant of the right breast.  MR guided core biopsy is recommended. 3. Enlarged level I and II right axillary adenopathy.  RECOMMENDATION: MR guided core biopsy of the right breast.  THREE-DIMENSIONAL MR IMAGE RENDERING ON INDEPENDENT WORKSTATION:  Three-dimensional MR images were rendered by post-processing of the original MR data on an independent workstation.  The three- dimensional MR images were interpreted, and findings were reported in the accompanying complete MRI report for this study.  BI-RADS CATEGORY 4:  Suspicious abnormality - biopsy should be considered.   Original Report Authenticated By: Norwood Levo  Judyann Munson, M.D.    Mr Biopsy/wire Localization  03/20/2012  *RADIOLOGY REPORT*  Clinical Data:  Known right breast carcinoma.  Additional area of worrisome enhancement located within the anterior upper outer quadrant of the right breast.  MRI GUIDED VACUUM ASSISTED BIOPSY OF THE RIGHT BREAST WITHOUT AND WITH CONTRAST  Comparison: Previous exams.  Technique: Multiplanar, multisequence MR images of the right breast were obtained prior to and following the intravenous administration of 12 ml of Mulithance.  I met with the patient, and we discussed the procedure of MRI guided biopsy, including risks, benefits, and alternatives. Specifically, we discussed the risks of infection, bleeding, tissue injury, clip migration, and inadequate sampling.  Informed, written consent was given.  Using sterile technique, 2% Lidocaine, MRI guidance, and a 9 gauge vacuum assisted device, biopsy was performed of the area of enhancement located within the anterior portion of the  upper-outer quadrant - right breast using a lateral approach.  At the conclusion of the procedure, a  tissue marker clip was deployed into the biopsy cavity.  IMPRESSION: MRI guided biopsy of the area of enhancement located within the anterior portion of the upper-outer quadrant of the right breast as discussed above. No apparent complications.  BI-RADS CATEGORY 6:  Known biopsy-proven malignancy - appropriate action should be taken.  THREE-DIMENSIONAL MR IMAGE RENDERING ON INDEPENDENT WORKSTATION:  Three-dimensional MR images were rendered by post-processing of the original MR data on an independent workstation.  The three- dimensional MR images were interpreted, and findings were reported in the accompanying complete MRI report for this study.  The pathology associated with the right breast MR guided core biopsy demonstrated invasive mammary carcinoma and in situ mammary carcinoma.  This is concordant with imaging findings.  I have discussed findings with the patient by telephone and answered her questions.  The patient states that biopsy site is clean and dry without hematoma formation or signs of infection.  Post biopsy wound care instructions were reviewed with the patient.  Follow-up will be with Dr. Carolynne Edouard and Dr. Welton Flakes regards her treatment plan.  The patient was encouraged to call the [facility] for additional questions or concerns   Original Report Authenticated By: Rolla Plate, M.D.    Nm Pet Image Initial (pi) Skull Base To Thigh  03/22/2012  *RADIOLOGY REPORT*  Clinical Data: Initial treatment strategy for high risk right breast cancer.  NUCLEAR MEDICINE PET SKULL BASE TO THIGH  Fasting Blood Glucose:  100  Technique:  18.6 mCi F-18 FDG was injected intravenously. CT data was obtained and used for attenuation correction and anatomic localization only.  (This was not acquired as a diagnostic CT examination.) Additional exam technical data entered on technologist worksheet.  Comparison:  MRI  breast dated 03/12/2012  Findings:  Neck: No hypermetabolic lymph nodes in the neck.  Chest:  Vague hypermetabolism in the upper/central right breast, max SUV 2.7.  Additional focal hypermetabolism in the upper/outer right breast, max SUV 2.3 (PET image 105).  These findings correspond to biopsy-proven primary breast carcinoma.  1.3 cm short axis right subpectoral lymph node (series 2/image 66), max SUV 3.1.  Right axillary lymphadenopathy measuring up to 1.6 cm short axis (series 2/image 88), max SUV 4.8).  No hypermetabolic mediastinal or hilar nodes.  No suspicious pulmonary nodules on the CT scan.  Abdomen/Pelvis:  No abnormal hypermetabolic activity within the liver, pancreas, adrenal glands, or spleen.  No hypermetabolic lymph nodes in the abdomen or pelvis.  Skeleton:  No focal hypermetabolic activity to suggest skeletal  metastasis.  IMPRESSION: Two foci of hypermetabolism in the upper right breast, max SUV 2.7, corresponding to biopsy-proven primary breast carcinoma.  Associated right subpectoral and axillary nodal metastases, max SUV 4.8.  No evidence of distant metastases.   Original Report Authenticated By: Charline Bills, M.D.    Mm Digital Diagnostic Unilat R  03/20/2012  *RADIOLOGY REPORT*  Clinical Data:  Post right breast MR guided core biopsy.  DIGITAL DIAGNOSTIC RIGHT BREAST MAMMOGRAM  Comparison:  03/05/2012, 03/01/2012, 03/01/2011, 02/26/2010 mammograms from [facility] imaging.  Findings:  Films are performed following MR guided biopsy of the anterior portion of the upper-outer quadrant of the right breast. The hourglass shaped clip placed during the MR guided core biopsy appears in appropriate position.  IMPRESSION: Appropriate positioning of clip following right breast MR guided core biopsy.   Original Report Authenticated By: Rolla Plate, M.D.     ASSESSMENT: 66 year old female with  #1 new diagnosis of invasive ductal carcinoma that is ER positive and HER-2/neu positive. Patient and  I had an extensive discussion regarding treatment options including doing breast conservation versus doing a mastectomy up front. We discussed the pros and cons of both approaches. She understands that if we were to do breast conservation she would need chemotherapy first which would consist of Herceptin based chemotherapy since she is HER-2 positive. After an extensive discussion she has opted for a mastectomy up front.   PLAN:   #1 patient will proceed with mastectomy and axillary lymph node dissection. She will need a Port-A-Cath placed.  #2 adjuvantly she will receive Taxotere carboplatinum and Herceptin.   All questions were answered. The patient knows to call the clinic with any problems, questions or concerns. We can certainly see the patient much sooner if necessary.  I spent 40 minutes counseling the patient face to face. The total time spent in the appointment was 40 minutes.    Drue Second, MD Medical/Oncology [facility] [phone] (beeper) [phone] (Office)  03/30/2012, 10:34 AM

## 2012-03-30 NOTE — Patient Instructions (Addendum)
Proceed with surgery  I will see you back in 12/12 for follow up

## 2012-03-31 ENCOUNTER — Other Ambulatory Visit (INDEPENDENT_AMBULATORY_CARE_PROVIDER_SITE_OTHER): Payer: Self-pay | Admitting: General Surgery

## 2012-04-04 ENCOUNTER — Telehealth (INDEPENDENT_AMBULATORY_CARE_PROVIDER_SITE_OTHER): Payer: Self-pay

## 2012-04-04 ENCOUNTER — Other Ambulatory Visit: Payer: Medicare Other | Admitting: Lab

## 2012-04-04 ENCOUNTER — Ambulatory Visit: Payer: Medicare Other | Admitting: Oncology

## 2012-04-04 NOTE — Telephone Encounter (Signed)
Dr. Park Breed called today about the patient's surgery date which is 05/07/12.  She is concerned that this is too far out since the patient received her diagnosis 6 weeks ago and is HER2+.  Please call her to discuss.

## 2012-04-09 ENCOUNTER — Telehealth: Payer: Self-pay | Admitting: *Deleted

## 2012-04-09 ENCOUNTER — Telehealth (INDEPENDENT_AMBULATORY_CARE_PROVIDER_SITE_OTHER): Payer: Self-pay | Admitting: General Surgery

## 2012-04-09 NOTE — Telephone Encounter (Signed)
Spoke with Corrie Dandy and informed her that we are currently working on moving this pt's surgery date up to 04/26/12 per Dr. Carolynne Edouard.  Informed her to call me if Dr. Welton Flakes still believes this to be too far as well as to make sure that we are still planning for the mastectomy and PAC placement.

## 2012-04-09 NOTE — Telephone Encounter (Signed)
Pt called questioning when she was scheduled for surgery.  Informed pt that currently she was scheduled for 05/07/12, but that [facility] was working on getting her scheduled earlier.  Pt relayed that if she was going to be scheduled on 05/07/12 she might prefer to have surgery at the beginning of the year.  I informed Dr. Welton Flakes of pt comment and called Debbie at  [facility] to see if there had been a change.  Informed pt that I would call her with any new information.  Pt denies further needs at this time.

## 2012-04-13 ENCOUNTER — Telehealth: Payer: Self-pay | Admitting: *Deleted

## 2012-04-13 ENCOUNTER — Encounter: Payer: Self-pay | Admitting: *Deleted

## 2012-04-13 NOTE — Telephone Encounter (Signed)
Patient confirmed over the phone on 05-11-2012 starting at 9:30am

## 2012-04-13 NOTE — Telephone Encounter (Signed)
Please r/s lab/Dr. Herma Ard from 12/12 to 05/11/12 per Dr. Welton Flakes and injection to 1/4. Please call pt. Thanks

## 2012-04-13 NOTE — Telephone Encounter (Signed)
Sent michelle email to cancel the 04-19-2012 04-20-2012 cancelled the injection  Add on 05-11-2012 treatment   Add on 05-12-2012 injection

## 2012-04-16 ENCOUNTER — Telehealth: Payer: Self-pay | Admitting: *Deleted

## 2012-04-16 ENCOUNTER — Encounter: Payer: Self-pay | Admitting: *Deleted

## 2012-04-16 DIAGNOSIS — C50419 Malignant neoplasm of upper-outer quadrant of unspecified female breast: Secondary | ICD-10-CM

## 2012-04-16 NOTE — Telephone Encounter (Signed)
Per staff message and POF I have scheduled appts.  JMW  

## 2012-04-16 NOTE — Telephone Encounter (Signed)
Patient confirmed over the phone the new date and time on 05-18-2012 starting at 12:00pm

## 2012-04-16 NOTE — Telephone Encounter (Signed)
Confirmed surgery date and f/u appt with Dr. Welton Flakes.  Pt denies further needs at this time.  Encourage pt to call with questions or concerns.  Contact information given.

## 2012-04-17 ENCOUNTER — Encounter (HOSPITAL_COMMUNITY): Payer: Self-pay

## 2012-04-17 ENCOUNTER — Ambulatory Visit (HOSPITAL_COMMUNITY)
Admission: RE | Admit: 2012-04-17 | Discharge: 2012-04-17 | Disposition: A | Discharge status: Home / Self Care | Payer: Medicare Other | Source: Ambulatory Visit | Attending: Internal Medicine | Admitting: Internal Medicine

## 2012-04-17 ENCOUNTER — Other Ambulatory Visit (HOSPITAL_COMMUNITY): Payer: Medicare Other

## 2012-04-17 VITALS — BP 158/90 | HR 98 | Wt 132.4 lb

## 2012-04-17 DIAGNOSIS — C50419 Malignant neoplasm of upper-outer quadrant of unspecified female breast: Secondary | ICD-10-CM

## 2012-04-17 DIAGNOSIS — I10 Essential (primary) hypertension: Secondary | ICD-10-CM

## 2012-04-17 MED ORDER — SPIRONOLACTONE 25 MG PO TABS: 12.5000 mg | ORAL_TABLET | Freq: Every day | ORAL | Status: DC

## 2012-04-17 NOTE — Patient Instructions (Addendum)
Follow up in 4 months  Take Spironolactone 12.5 mg daily

## 2012-04-17 NOTE — Progress Notes (Signed)
Patient ID: Heather [name], female   DOB: October 20, 1945, 66 y.o.   MRN: [id number] Referring Oncologistt: Dr Welton Flakes Oncologistt: Dr Welton Flakes PCP: Dr Cliffton Asters General Surgeon: Dr Carolynne Edouard  HPI:  Heather [name] is a 66 year old female recently diagnosed with invasive ductal carcinoma in R breast that is ER positive and HER-2/neu positive,  Plan for R mastectomy and lymph node dissection 05/10/12. Plan to adjuvantly receive Taxotere carboplatinum and Herceptin. She has been treated for HTN in the past but stopped taking the medications due to elevated renal function. Also tried ace inhibitor last year but stopped due to cough.   She is referred to HF clinic by Dr Park Breed. Denies h/o known heart disease. SOB/CP/PND/Orthopnea. Intermittent dizziness. She has had lower extremity edema. Retired. Able to do all ADls without problem. Not able to exercise due to fatigue.   03/30/12 Potassium 3.4 Creatinine 0.9   03/28/12 ECHO 60-65% Lateral S' 9.3  Review of Systems:     Cardiac Review of Systems: {Y] = yes [ ]  = no  Chest Pain [    ]  Resting SOB [   ] Exertional SOB  [  ]  Orthopnea [  ]   Pedal Edema [   ]    Palpitations [  ] Syncope  [  ]   Presyncope [   ]  General Review of Systems: [Y] = yes [  ]=no Constitional: recent weight change [ Y ]; anorexia [  ]; fatigue [  Y]; nausea [  ]; night sweats [  ]; fever [  ]; or chills [  ];                                                                                                                                          Dental: poor dentition[  ]; Last Dentist visit:   Eye : blurred vision [  ]; diplopia [   ]; vision changes [  ];  Amaurosis fugax[  ]; Resp: cough [Y  ];  wheezing[  ];  hemoptysis[  ]; shortness of breath[  ]; paroxysmal nocturnal dyspnea[  ]; dyspnea on exertion[  ]; or orthopnea[  ];  GI:  gallstones[  ], vomiting[  ];  dysphagia[  ]; melena[  ];  hematochezia [  ]; heartburn[  ];   Hx of  Colonoscopy[  ]; GU: kidney stones [  ]; hematuria[  ];   dysuria [   ];  nocturia[  ];  history of     obstruction [  ];                 Skin: rash, swelling[  ];, hair loss[  ];  peripheral edema[  ];  or itching[  ]; Musculosketetal: myalgias[  ];  joint swelling[  ];  joint erythema[  ];  joint pain[  ];  back pain[  ];  Heme/Lymph: bruising[  ];  bleeding[  ];  anemia[  ];  Neuro: TIA[  ];  headaches[  ];  stroke[  ];  vertigo[  ];  seizures[  ];   paresthesias[  ];  difficulty walking[  ]; dizziness - yes  Psych:depression[  ]; anxiety[ Y ];  Endocrine: diabetes[  ];  thyroid dysfunction[  ];  Immunizations: Flu [  ]; Pneumococcal[  ];  Other:    Past Medical History  Diagnosis Date  . Breast cancer   . GERD (gastroesophageal reflux disease)   . Skin cancer   . Chronic kidney disease   . Hx: UTI (urinary tract infection)   . Right shoulder pain   . Right knee pain     Current Outpatient Prescriptions  Medication Sig Dispense Refill  . B Complex-C (SUPER B COMPLEX PO) Take 1 each by mouth daily.      . calcium carbonate (TUMS - DOSED IN MG ELEMENTAL CALCIUM) 500 MG chewable tablet Chew 1 tablet by mouth daily.      . Cholecalciferol (VITAMIN D-3 PO) Take 4,000 Units by mouth daily.      . fish oil-omega-3 fatty acids 1000 MG capsule Take 1 g by mouth daily.      . magnesium gluconate (MAGONATE) 500 MG tablet Take 500 mg by mouth 2 (two) times daily.      Marland Kitchen omeprazole (PRILOSEC) 20 MG capsule Take 20 mg by mouth daily.      . Probiotic Product (PROBIOTIC DAILY PO) Take 1 each by mouth.         Allergies  Allergen Reactions  . Codeine     " loopy"  . Keflex (Cephalexin)     GI Upset    History   Social History  . Marital Status: Married    Spouse Name: N/A    Number of Children: N/A  . Years of Education: N/A   Occupational History  . Not on file.   Social History Main Topics  . Smoking status: Never Smoker   . Smokeless tobacco: Not on file  . Alcohol Use: No  . Drug Use: No  . Sexually Active: Yes   Other Topics Concern   . Not on file   Social History Narrative  . No narrative on file    Family History  Problem Relation Age of Onset  . Leukemia Mother   . Colon cancer Brother   . Colon cancer Maternal Grandmother     PHYSICAL EXAM: Filed Vitals:   04/17/12 1054  BP: 158/90  Pulse: 98   General:  Well appearing. No respiratory difficulty HEENT: normal Neck: supple. no JVD. Carotids 2+ bilat; no bruits. No lymphadenopathy or thryomegaly appreciated. Cor: PMI nondisplaced. Regular rate & rhythm. No rubs, gallops or murmurs. Lungs: clear Abdomen: soft, nontender, nondistended. No hepatosplenomegaly. No bruits or masses. Good bowel sounds. Extremities: no cyanosis, clubbing, rash, edema Neuro: alert & oriented x 3, cranial nerves grossly intact. moves all 4 extremities w/o difficulty. Affect pleasant.    No results found for this or any previous visit (from the past 24 hour(s)). No results found.   ASSESSMENT & PLAN:

## 2012-04-17 NOTE — Assessment & Plan Note (Addendum)
SBP> 130. Add Spironolactone 12.5 mg daily. Check BMET next week. Discussed potential side effects.    Attending: Agreed.

## 2012-04-17 NOTE — Assessment & Plan Note (Addendum)
Explained purpose of HF clinic as it relates to breast cancer treatment. Reviewed medical records and most recent lab work. Explained that there is a 1 in 10 chance of cardiotoxicity related to herceptin treatment. Follow up in 4 months with an ECHO.   Patient seen and examined with Tonye Becket, NP. We discussed all aspects of the encounter. I agree with the assessment and plan as stated above. Explained role of cardio-oncology clinic and signs/sx of cardiotoxicity to watch out for. Reviewed echo personally. F/u q3 months with echo.

## 2012-04-19 ENCOUNTER — Ambulatory Visit: Payer: Medicare Other

## 2012-04-19 ENCOUNTER — Other Ambulatory Visit: Payer: Medicare Other | Admitting: Lab

## 2012-04-19 ENCOUNTER — Ambulatory Visit: Payer: Medicare Other | Admitting: Oncology

## 2012-04-20 ENCOUNTER — Ambulatory Visit: Payer: Medicare Other

## 2012-04-23 ENCOUNTER — Encounter (INDEPENDENT_AMBULATORY_CARE_PROVIDER_SITE_OTHER): Payer: Self-pay

## 2012-04-24 ENCOUNTER — Encounter (HOSPITAL_COMMUNITY): Payer: Self-pay | Admitting: Pharmacy Technician

## 2012-04-26 ENCOUNTER — Ambulatory Visit: Payer: Medicare Other

## 2012-04-26 ENCOUNTER — Telehealth (HOSPITAL_COMMUNITY): Payer: Self-pay | Admitting: Cardiology

## 2012-04-26 DIAGNOSIS — I10 Essential (primary) hypertension: Secondary | ICD-10-CM

## 2012-04-26 LAB — BASIC METABOLIC PANEL
BUN: 15 mg/dL (ref 6–23)
CO2: 31 mEq/L (ref 19–32)
Calcium: 9.9 mg/dL (ref 8.4–10.5)
Chloride: 98 mEq/L (ref 96–112)
Creatinine, Ser: 1 mg/dL (ref 0.4–1.2)
GFR: 62.44 mL/min (ref >60.00)
Glucose, Bld: 86 mg/dL (ref 70–99)
Potassium: 4.2 mEq/L (ref 3.5–5.1)
Sodium: 138 mEq/L (ref 135–145)

## 2012-04-26 NOTE — Telephone Encounter (Signed)
Order placed for lab order

## 2012-05-04 ENCOUNTER — Encounter (HOSPITAL_COMMUNITY)
Admission: RE | Admit: 2012-05-04 | Discharge: 2012-05-04 | Disposition: A | Discharge status: Home / Self Care | Payer: Medicare Other | Source: Ambulatory Visit | Attending: General Surgery | Admitting: General Surgery

## 2012-05-04 ENCOUNTER — Encounter (HOSPITAL_COMMUNITY): Payer: Self-pay

## 2012-05-04 HISTORY — DX: Personal history of urinary (tract) infections: Z87.440

## 2012-05-04 HISTORY — DX: Other complications of anesthesia, initial encounter: T88.59XA

## 2012-05-04 HISTORY — DX: Unspecified hemorrhoids: K64.9

## 2012-05-04 HISTORY — DX: Personal history of other diseases of the digestive system: Z87.19

## 2012-05-04 HISTORY — DX: Essential (primary) hypertension: I10

## 2012-05-04 HISTORY — DX: Unspecified cataract: H26.9

## 2012-05-04 HISTORY — DX: Anxiety disorder, unspecified: F41.9

## 2012-05-04 HISTORY — DX: Dizziness and giddiness: R42

## 2012-05-04 HISTORY — DX: Insomnia, unspecified: G47.00

## 2012-05-04 HISTORY — DX: Frequency of micturition: R35.0

## 2012-05-04 HISTORY — DX: Adverse effect of unspecified anesthetic, initial encounter: T41.45XA

## 2012-05-04 HISTORY — DX: Anemia, unspecified: D64.9

## 2012-05-04 HISTORY — DX: Hyperlipidemia, unspecified: E78.5

## 2012-05-04 LAB — CBC
HCT: 43.2 % (ref 36.0–46.0)
Hemoglobin: 14.2 g/dL (ref 12.0–15.0)
MCH: 28.6 pg (ref 26.0–34.0)
MCHC: 32.9 g/dL (ref 30.0–36.0)
MCV: 86.9 fL (ref 78.0–100.0)
Platelets: 224 10*3/uL (ref 150–400)
RBC: 4.97 MIL/uL (ref 3.87–5.11)
RDW: 12.7 % (ref 11.5–15.5)
WBC: 4.9 10*3/uL (ref 4.0–10.5)

## 2012-05-04 LAB — BASIC METABOLIC PANEL
BUN: 18 mg/dL (ref 6–23)
CO2: 29 mEq/L (ref 19–32)
Calcium: 10.4 mg/dL (ref 8.4–10.5)
Chloride: 97 mEq/L (ref 96–112)
Creatinine, Ser: 0.89 mg/dL (ref 0.50–1.10)
GFR calc Af Amer: 77 mL/min — ABNORMAL LOW (ref >90)
GFR calc non Af Amer: 66 mL/min — ABNORMAL LOW (ref >90)
Glucose, Bld: 78 mg/dL (ref 70–99)
Potassium: 4.1 mEq/L (ref 3.5–5.1)
Sodium: 138 mEq/L (ref 135–145)

## 2012-05-04 LAB — SURGICAL PCR SCREEN
MRSA, PCR: NEGATIVE
Staphylococcus aureus: NEGATIVE

## 2012-05-04 MED ORDER — CHLORHEXIDINE GLUCONATE 4 % EX LIQD: 1.0000 "application " | Freq: Once | CUTANEOUS | Status: DC

## 2012-05-04 NOTE — Progress Notes (Addendum)
Cardiologist is Dr.Bensimone-last visit on 04/17/12--was sent bc of taking chemo not any problems-report in epic  Denies ever having a stress test  Echo done end of Nov- to request report Denies ever haivng a heart cath  Dr.Cynthia White with [facility] is Medical MD  CXR and EKG >59yr ago

## 2012-05-04 NOTE — Pre-Procedure Instructions (Signed)
20 Heather [name]  05/04/2012   Your procedure is scheduled on:  Thurs, Jan 2 @ 7:30 AM  Report to [facility] at 5:30 AM.  Call this number if you have problems the morning of surgery: [phone]   Remember:   Do not eat food:After Midnight.  Do not wear jewelry, make-up or nail polish.  Do not wear lotions, powders, or perfumes. You may wear deodorant.  Do not shave 48 hours prior to surgery.   Do not bring valuables to the hospital.  Contacts, dentures or bridgework may not be worn into surgery.  Leave suitcase in the car. After surgery it may be brought to your room.  For patients admitted to the hospital, checkout time is 11:00 AM the day of discharge.   Patients discharged the day of surgery will not be allowed to drive home.    Special Instructions: Shower using CHG 2 nights before surgery and the night before surgery.  If you shower the day of surgery use CHG.  Use special wash - you have one bottle of CHG for all showers.  You should use approximately 1/3 of the bottle for each shower.   Please read over the following fact sheets that you were given: Pain Booklet, Coughing and Deep Breathing, MRSA Information and Surgical Site Infection Prevention

## 2012-05-09 MED ORDER — VANCOMYCIN HCL IN DEXTROSE 1-5 GM/200ML-% IV SOLN
1000.0000 mg | INTRAVENOUS | Status: AC
  Administered 2012-05-10: 1000 mg via INTRAVENOUS
  Filled 2012-05-09: qty 200

## 2012-05-10 ENCOUNTER — Encounter (HOSPITAL_COMMUNITY): Payer: Self-pay | Admitting: *Deleted

## 2012-05-10 ENCOUNTER — Encounter (HOSPITAL_COMMUNITY)
Admission: RE | Disposition: A | Discharge status: Home / Self Care | Payer: Self-pay | Source: Ambulatory Visit | Attending: General Surgery

## 2012-05-10 ENCOUNTER — Observation Stay (HOSPITAL_COMMUNITY): Payer: Medicare Other

## 2012-05-10 ENCOUNTER — Encounter (HOSPITAL_COMMUNITY): Payer: Self-pay | Admitting: Anesthesiology

## 2012-05-10 ENCOUNTER — Ambulatory Visit (HOSPITAL_COMMUNITY): Payer: Medicare Other | Admitting: Anesthesiology

## 2012-05-10 ENCOUNTER — Ambulatory Visit (HOSPITAL_COMMUNITY): Payer: Medicare Other

## 2012-05-10 ENCOUNTER — Observation Stay (HOSPITAL_COMMUNITY)
Admission: RE | Admit: 2012-05-10 | Discharge: 2012-05-11 | Disposition: A | Discharge status: Home / Self Care | Payer: Medicare Other | Source: Ambulatory Visit | Attending: General Surgery | Admitting: General Surgery

## 2012-05-10 ENCOUNTER — Encounter (HOSPITAL_COMMUNITY): Payer: Self-pay | Admitting: General Practice

## 2012-05-10 DIAGNOSIS — I10 Essential (primary) hypertension: Secondary | ICD-10-CM | POA: Insufficient documentation

## 2012-05-10 DIAGNOSIS — K219 Gastro-esophageal reflux disease without esophagitis: Secondary | ICD-10-CM | POA: Insufficient documentation

## 2012-05-10 DIAGNOSIS — K449 Diaphragmatic hernia without obstruction or gangrene: Secondary | ICD-10-CM | POA: Insufficient documentation

## 2012-05-10 DIAGNOSIS — C50919 Malignant neoplasm of unspecified site of unspecified female breast: Principal | ICD-10-CM | POA: Insufficient documentation

## 2012-05-10 DIAGNOSIS — Z01812 Encounter for preprocedural laboratory examination: Secondary | ICD-10-CM | POA: Insufficient documentation

## 2012-05-10 DIAGNOSIS — Z01810 Encounter for preprocedural cardiovascular examination: Secondary | ICD-10-CM | POA: Insufficient documentation

## 2012-05-10 DIAGNOSIS — C773 Secondary and unspecified malignant neoplasm of axilla and upper limb lymph nodes: Secondary | ICD-10-CM | POA: Insufficient documentation

## 2012-05-10 DIAGNOSIS — C50419 Malignant neoplasm of upper-outer quadrant of unspecified female breast: Secondary | ICD-10-CM

## 2012-05-10 DIAGNOSIS — F411 Generalized anxiety disorder: Secondary | ICD-10-CM | POA: Insufficient documentation

## 2012-05-10 DIAGNOSIS — Z01818 Encounter for other preprocedural examination: Secondary | ICD-10-CM | POA: Insufficient documentation

## 2012-05-10 HISTORY — PX: PORTACATH PLACEMENT: SHX2246

## 2012-05-10 HISTORY — PX: MASTECTOMY MODIFIED RADICAL: SHX5962

## 2012-05-10 SURGERY — MASTECTOMY, MODIFIED RADICAL
Anesthesia: General | Site: Chest | Laterality: Right | Wound class: Clean

## 2012-05-10 MED ORDER — MORPHINE SULFATE (PF) 1 MG/ML IV SOLN
INTRAVENOUS | Status: DC
  Administered 2012-05-10 (×2): 1.5 mg via INTRAVENOUS
  Administered 2012-05-10: 7 mg via INTRAVENOUS

## 2012-05-10 MED ORDER — SUPER B COMPLEX PO TABS: 1.0000 | ORAL_TABLET | Freq: Every morning | ORAL | Status: DC

## 2012-05-10 MED ORDER — EVICEL 5 ML EX KIT
PACK | CUTANEOUS | Status: AC
  Filled 2012-05-10: qty 1

## 2012-05-10 MED ORDER — NALOXONE HCL 0.4 MG/ML IJ SOLN: 0.4000 mg | INTRAMUSCULAR | Status: DC | PRN

## 2012-05-10 MED ORDER — SODIUM CHLORIDE 0.9 % IR SOLN
Status: DC | PRN
  Administered 2012-05-10: 07:00:00

## 2012-05-10 MED ORDER — LACTATED RINGERS IV SOLN
INTRAVENOUS | Status: DC | PRN
  Administered 2012-05-10 (×2): via INTRAVENOUS

## 2012-05-10 MED ORDER — ONDANSETRON HCL 4 MG PO TABS: 4.0000 mg | ORAL_TABLET | Freq: Four times a day (QID) | ORAL | Status: DC | PRN

## 2012-05-10 MED ORDER — CALCIUM CARBONATE ANTACID 500 MG PO CHEW
1.0000 | CHEWABLE_TABLET | Freq: Every day | ORAL | Status: DC
  Administered 2012-05-10: 200 mg via ORAL
  Filled 2012-05-10 (×2): qty 1

## 2012-05-10 MED ORDER — HEPARIN SOD (PORK) LOCK FLUSH 100 UNIT/ML IV SOLN
INTRAVENOUS | Status: DC | PRN
  Administered 2012-05-10: 500 [IU]

## 2012-05-10 MED ORDER — LIDOCAINE HCL (CARDIAC) 20 MG/ML IV SOLN
INTRAVENOUS | Status: DC | PRN
  Administered 2012-05-10: 100 mg via INTRAVENOUS

## 2012-05-10 MED ORDER — OXYCODONE-ACETAMINOPHEN 5-325 MG PO TABS: 1.0000 | ORAL_TABLET | ORAL | Status: DC | PRN

## 2012-05-10 MED ORDER — SPIRONOLACTONE 12.5 MG HALF TABLET
12.5000 mg | ORAL_TABLET | Freq: Every day | ORAL | Status: DC
  Administered 2012-05-10: 12.5 mg via ORAL
  Filled 2012-05-10 (×2): qty 1

## 2012-05-10 MED ORDER — ONDANSETRON HCL 4 MG/2ML IJ SOLN
INTRAMUSCULAR | Status: AC
  Filled 2012-05-10: qty 2

## 2012-05-10 MED ORDER — PROPOFOL 10 MG/ML IV BOLUS
INTRAVENOUS | Status: DC | PRN
  Administered 2012-05-10: 160 mg via INTRAVENOUS

## 2012-05-10 MED ORDER — PROBIOTIC DAILY PO CAPS: 1.0000 | ORAL_CAPSULE | Freq: Every morning | ORAL | Status: DC

## 2012-05-10 MED ORDER — VITAMIN D3 25 MCG (1000 UNIT) PO TABS
1000.0000 [IU] | ORAL_TABLET | Freq: Every day | ORAL | Status: DC
  Administered 2012-05-10: 1000 [IU] via ORAL
  Filled 2012-05-10 (×2): qty 1

## 2012-05-10 MED ORDER — FENTANYL CITRATE 0.05 MG/ML IJ SOLN
INTRAMUSCULAR | Status: DC | PRN
  Administered 2012-05-10: 25 ug via INTRAVENOUS
  Administered 2012-05-10: 50 ug via INTRAVENOUS
  Administered 2012-05-10 (×2): 25 ug via INTRAVENOUS
  Administered 2012-05-10: 50 ug via INTRAVENOUS
  Administered 2012-05-10: 25 ug via INTRAVENOUS

## 2012-05-10 MED ORDER — SODIUM CHLORIDE 0.9 % IR SOLN
Status: DC | PRN
  Administered 2012-05-10 (×2): 1

## 2012-05-10 MED ORDER — B COMPLEX-C PO TABS
1.0000 | ORAL_TABLET | Freq: Every day | ORAL | Status: DC
  Administered 2012-05-10: 1 via ORAL
  Filled 2012-05-10 (×2): qty 1

## 2012-05-10 MED ORDER — LIDOCAINE HCL (PF) 1 % IJ SOLN
INTRAMUSCULAR | Status: AC
  Filled 2012-05-10: qty 30

## 2012-05-10 MED ORDER — VITAMIN D-3 25 MCG (1000 UT) PO CAPS: 1.0000 | ORAL_CAPSULE | Freq: Every morning | ORAL | Status: DC

## 2012-05-10 MED ORDER — ALBUMIN HUMAN 5 % IV SOLN
INTRAVENOUS | Status: AC
  Filled 2012-05-10: qty 250

## 2012-05-10 MED ORDER — BUPIVACAINE HCL (PF) 0.25 % IJ SOLN
INTRAMUSCULAR | Status: AC
  Filled 2012-05-10: qty 30

## 2012-05-10 MED ORDER — DIPHENHYDRAMINE HCL 50 MG/ML IJ SOLN: 12.5000 mg | Freq: Four times a day (QID) | INTRAMUSCULAR | Status: DC | PRN

## 2012-05-10 MED ORDER — OXYCODONE-ACETAMINOPHEN 5-325 MG PO TABS
ORAL_TABLET | ORAL | Status: AC
  Administered 2012-05-10: 2
  Filled 2012-05-10: qty 2

## 2012-05-10 MED ORDER — ONDANSETRON HCL 4 MG/2ML IJ SOLN
INTRAMUSCULAR | Status: DC | PRN
  Administered 2012-05-10: 4 mg via INTRAVENOUS

## 2012-05-10 MED ORDER — FENTANYL CITRATE 0.05 MG/ML IJ SOLN
INTRAMUSCULAR | Status: AC
  Filled 2012-05-10: qty 2

## 2012-05-10 MED ORDER — SACCHAROMYCES BOULARDII 250 MG PO CAPS
250.0000 mg | ORAL_CAPSULE | Freq: Every day | ORAL | Status: DC
  Administered 2012-05-10: 250 mg via ORAL
  Filled 2012-05-10 (×2): qty 1

## 2012-05-10 MED ORDER — MAGNESIUM GLUCONATE 500 MG PO TABS
500.0000 mg | ORAL_TABLET | Freq: Two times a day (BID) | ORAL | Status: DC
  Administered 2012-05-10: 500 mg via ORAL
  Filled 2012-05-10 (×4): qty 1

## 2012-05-10 MED ORDER — HEPARIN SOD (PORK) LOCK FLUSH 100 UNIT/ML IV SOLN
INTRAVENOUS | Status: AC
  Filled 2012-05-10: qty 5

## 2012-05-10 MED ORDER — EPHEDRINE SULFATE 50 MG/ML IJ SOLN
INTRAMUSCULAR | Status: DC | PRN
  Administered 2012-05-10: 5 mg via INTRAVENOUS

## 2012-05-10 MED ORDER — DIPHENHYDRAMINE HCL 12.5 MG/5ML PO ELIX
12.5000 mg | ORAL_SOLUTION | Freq: Four times a day (QID) | ORAL | Status: DC | PRN
  Filled 2012-05-10: qty 5

## 2012-05-10 MED ORDER — BUPIVACAINE HCL (PF) 0.25 % IJ SOLN
INTRAMUSCULAR | Status: DC | PRN
  Administered 2012-05-10: 4 mL

## 2012-05-10 MED ORDER — KCL IN DEXTROSE-NACL 20-5-0.9 MEQ/L-%-% IV SOLN
INTRAVENOUS | Status: DC
  Administered 2012-05-10: 15:00:00 via INTRAVENOUS
  Filled 2012-05-10 (×3): qty 1000

## 2012-05-10 MED ORDER — SODIUM CHLORIDE 0.9 % IJ SOLN: 9.0000 mL | INTRAMUSCULAR | Status: DC | PRN

## 2012-05-10 MED ORDER — ONDANSETRON HCL 4 MG/2ML IJ SOLN
4.0000 mg | Freq: Four times a day (QID) | INTRAMUSCULAR | Status: DC | PRN
  Administered 2012-05-10: 4 mg via INTRAVENOUS

## 2012-05-10 MED ORDER — DEXAMETHASONE SODIUM PHOSPHATE 4 MG/ML IJ SOLN
INTRAMUSCULAR | Status: DC | PRN
  Administered 2012-05-10: 8 mg via INTRAVENOUS

## 2012-05-10 MED ORDER — FENTANYL CITRATE 0.05 MG/ML IJ SOLN
25.0000 ug | INTRAMUSCULAR | Status: DC | PRN
  Administered 2012-05-10 (×2): 50 ug via INTRAVENOUS

## 2012-05-10 MED ORDER — METOCLOPRAMIDE HCL 5 MG/ML IJ SOLN: 10.0000 mg | Freq: Once | INTRAMUSCULAR | Status: DC | PRN

## 2012-05-10 MED ORDER — MIDAZOLAM HCL 5 MG/5ML IJ SOLN
INTRAMUSCULAR | Status: DC | PRN
  Administered 2012-05-10: 2 mg via INTRAVENOUS

## 2012-05-10 MED ORDER — ONDANSETRON HCL 4 MG/2ML IJ SOLN
4.0000 mg | Freq: Four times a day (QID) | INTRAMUSCULAR | Status: DC | PRN
  Administered 2012-05-10: 4 mg via INTRAVENOUS
  Filled 2012-05-10: qty 2

## 2012-05-10 MED ORDER — MORPHINE SULFATE (PF) 1 MG/ML IV SOLN
INTRAVENOUS | Status: AC
  Administered 2012-05-10: 10:00:00
  Filled 2012-05-10: qty 25

## 2012-05-10 SURGICAL SUPPLY — 74 items
APPLIER CLIP 9.375 MED OPEN (MISCELLANEOUS) ×6
BAG DECANTER FOR FLEXI CONT (MISCELLANEOUS) ×3 IMPLANT
BINDER BREAST LRG (GAUZE/BANDAGES/DRESSINGS) ×3 IMPLANT
BINDER BREAST XLRG (GAUZE/BANDAGES/DRESSINGS) IMPLANT
BLADE SURG 15 STRL LF DISP TIS (BLADE) ×2 IMPLANT
BLADE SURG 15 STRL SS (BLADE) ×1
CANISTER SUCTION 2500CC (MISCELLANEOUS) ×3 IMPLANT
CHLORAPREP W/TINT 10.5 ML (MISCELLANEOUS) IMPLANT
CHLORAPREP W/TINT 26ML (MISCELLANEOUS) ×6 IMPLANT
CLIP APPLIE 9.375 MED OPEN (MISCELLANEOUS) ×4 IMPLANT
CLOTH BEACON ORANGE TIMEOUT ST (SAFETY) ×3 IMPLANT
COVER SURGICAL LIGHT HANDLE (MISCELLANEOUS) ×3 IMPLANT
CRADLE DONUT ADULT HEAD (MISCELLANEOUS) ×3 IMPLANT
DECANTER SPIKE VIAL GLASS SM (MISCELLANEOUS) ×3 IMPLANT
DERMABOND ADVANCED (GAUZE/BANDAGES/DRESSINGS) ×2
DERMABOND ADVANCED .7 DNX12 (GAUZE/BANDAGES/DRESSINGS) ×4 IMPLANT
DRAIN CHANNEL 19F RND (DRAIN) ×6 IMPLANT
DRAPE C-ARM 42X72 X-RAY (DRAPES) ×3 IMPLANT
DRAPE CHEST BREAST 15X10 FENES (DRAPES) ×3 IMPLANT
DRAPE LAPAROSCOPIC ABDOMINAL (DRAPES) IMPLANT
DRAPE UTILITY 15X26 W/TAPE STR (DRAPE) ×6 IMPLANT
DRSG PAD ABDOMINAL 8X10 ST (GAUZE/BANDAGES/DRESSINGS) ×3 IMPLANT
ELECT CAUTERY BLADE 6.4 (BLADE) ×3 IMPLANT
ELECT REM PT RETURN 9FT ADLT (ELECTROSURGICAL) ×3
ELECTRODE REM PT RTRN 9FT ADLT (ELECTROSURGICAL) ×2 IMPLANT
EVACUATOR SILICONE 100CC (DRAIN) ×6 IMPLANT
GAUZE SPONGE 4X4 16PLY XRAY LF (GAUZE/BANDAGES/DRESSINGS) ×3 IMPLANT
GLOVE BIO SURGEON STRL SZ 6.5 (GLOVE) ×3 IMPLANT
GLOVE BIO SURGEON STRL SZ7.5 (GLOVE) ×6 IMPLANT
GLOVE BIOGEL PI IND STRL 7.0 (GLOVE) ×2 IMPLANT
GLOVE BIOGEL PI IND STRL 7.5 (GLOVE) ×4 IMPLANT
GLOVE BIOGEL PI INDICATOR 7.0 (GLOVE) ×1
GLOVE BIOGEL PI INDICATOR 7.5 (GLOVE) ×2
GLOVE ECLIPSE 7.5 STRL STRAW (GLOVE) ×9 IMPLANT
GLOVE SURG SS PI 7.0 STRL IVOR (GLOVE) ×3 IMPLANT
GOWN PREVENTION PLUS XXLARGE (GOWN DISPOSABLE) ×3 IMPLANT
GOWN STRL NON-REIN LRG LVL3 (GOWN DISPOSABLE) ×21 IMPLANT
INTRODUCER COOK 11FR (CATHETERS) IMPLANT
IV KIT POWERLOC 20X1 SAFE (NEEDLE) ×3 IMPLANT
KIT BASIN OR (CUSTOM PROCEDURE TRAY) ×3 IMPLANT
KIT PORT POWER 9.6FR MRI PREA (Catheter) IMPLANT
KIT PORT POWER ISP 8FR (Catheter) IMPLANT
KIT POWER CATH 8FR (Catheter) ×3 IMPLANT
KIT ROOM TURNOVER OR (KITS) ×3 IMPLANT
NEEDLE HYPO 25GX1X1/2 BEV (NEEDLE) ×3 IMPLANT
NS IRRIG 1000ML POUR BTL (IV SOLUTION) ×6 IMPLANT
PACK GENERAL/GYN (CUSTOM PROCEDURE TRAY) ×3 IMPLANT
PACK SURGICAL SETUP 50X90 (CUSTOM PROCEDURE TRAY) ×3 IMPLANT
PAD ARMBOARD 7.5X6 YLW CONV (MISCELLANEOUS) ×6 IMPLANT
PENCIL BUTTON HOLSTER BLD 10FT (ELECTRODE) ×3 IMPLANT
SET INTRODUCER 12FR PACEMAKER (SHEATH) IMPLANT
SET SHEATH INTRODUCER 10FR (MISCELLANEOUS) IMPLANT
SHEATH COOK PEEL AWAY SET 9F (SHEATH) IMPLANT
SPECIMEN JAR LARGE (MISCELLANEOUS) ×3 IMPLANT
SPECIMEN JAR X LARGE (MISCELLANEOUS) IMPLANT
SPONGE GAUZE 4X4 12PLY (GAUZE/BANDAGES/DRESSINGS) ×3 IMPLANT
STAPLER VISISTAT 35W (STAPLE) ×3 IMPLANT
SUT ETHILON 3 0 FSL (SUTURE) ×6 IMPLANT
SUT MNCRL AB 4-0 PS2 18 (SUTURE) ×3 IMPLANT
SUT MON AB 4-0 PC3 18 (SUTURE) ×3 IMPLANT
SUT PROLENE 2 0 SH 30 (SUTURE) ×6 IMPLANT
SUT SILK 2 0 (SUTURE) ×1
SUT SILK 2-0 18XBRD TIE 12 (SUTURE) ×2 IMPLANT
SUT VIC AB 3-0 54X BRD REEL (SUTURE) IMPLANT
SUT VIC AB 3-0 BRD 54 (SUTURE)
SUT VIC AB 3-0 SH 18 (SUTURE) ×3 IMPLANT
SUT VIC AB 3-0 SH 27 (SUTURE) ×1
SUT VIC AB 3-0 SH 27XBRD (SUTURE) ×2 IMPLANT
SYR 20ML ECCENTRIC (SYRINGE) ×6 IMPLANT
SYR 5ML LUER SLIP (SYRINGE) ×3 IMPLANT
SYR CONTROL 10ML LL (SYRINGE) ×3 IMPLANT
TOWEL OR 17X24 6PK STRL BLUE (TOWEL DISPOSABLE) ×3 IMPLANT
TOWEL OR 17X26 10 PK STRL BLUE (TOWEL DISPOSABLE) ×3 IMPLANT
WATER STERILE IRR 1000ML POUR (IV SOLUTION) IMPLANT

## 2012-05-10 NOTE — Progress Notes (Signed)
transferred to room 6n30 from pacu, sleepy but easily arousable, moved self from stretcher to bed,oriented to room and surroundings, family at bedside

## 2012-05-10 NOTE — Interval H&P Note (Signed)
History and Physical Interval Note:  05/10/2012 7:08 AM  Heather [name]  has presented today for surgery, with the diagnosis of right breast cancer  The various methods of treatment have been discussed with the patient and family. After consideration of risks, benefits and other options for treatment, the patient has consented to  Procedure(s) (LRB) with comments: MASTECTOMY MODIFIED RADICAL (Right) - right modified radical mastectomy and port placement INSERTION PORT-A-CATH (N/A) as a surgical intervention .  The patient's history has been reviewed, patient examined, no change in status, stable for surgery.  I have reviewed the patient's chart and labs.  Questions were answered to the patient's satisfaction.     TOTH III,Christine Morton S

## 2012-05-10 NOTE — H&P (Signed)
Heather [name]  Description:  67 year old female  03/14/2012 3:00 PM Office Visit Provider:  Robyne Askew, MD  MRN: [id number] Department:  [facility]            Diagnoses     Cancer of upper-outer quadrant of female breast - Primary    174.4                 Progress Notes     Robyne Askew, MD 03/14/2012 3:48 PM Signed    Subjective:    Patient ID: Heather [name], female DOB: 05/23/1945, 67 y.o. MRN: [id number]  HPI  We are asked to see the patient in consultation by Dr. Isabell Jarvis to evaluate her for a right breast cancer. The patient is a 67 six-year-old white female who recently went for her routine screening mammogram. She has chronic breast pain that occurs almost all the time which is worse on the left than the right. She states that caffeine makes the pain worse. She denies any discharge from her nipples. She denies taking any female hormones. Her recent mammogram showed an abnormality in the right breast. Her MRI estimated the size of this area to be 2.2 cm. The MRI also identified a second area anteriorly that was suspicious. Her biopsy showed an invasive ductal cancer. She also had an abnormal looking lymph node that was biopsied and was positive for breast cancer. She was ER positive and PR negative. She is HER-2 positive. Her Ki-67 was 44%. She does complain of some right shoulder pain. She also complains of dizziness for the last 6 or 7 weeks.  Review of Systems  Constitutional: Negative.  HENT: Negative.  Eyes: Negative.  Respiratory: Negative.  Cardiovascular: Negative.  Gastrointestinal: Negative.  Genitourinary: Negative.  Musculoskeletal: Positive for arthralgias.  Skin: Negative.  Neurological: Positive for dizziness.  Hematological: Negative.  Psychiatric/Behavioral: Negative.      Objective:    Physical Exam  Constitutional: She is oriented to person, place, and time. She appears well-developed and well-nourished.  HENT:    Head: Normocephalic and atraumatic.  Eyes: Conjunctivae normal and EOM are normal. Pupils are equal, round, and reactive to light.  Neck: Normal range of motion. Neck supple.  Cardiovascular: Normal rate, regular rhythm and normal heart sounds.  Pulmonary/Chest: Effort normal and breath sounds normal.  In the right breast she has a palpable skin lesion medially that she states was a benign lesion that was recently removed. She has some palpable fullness laterally in the right breast with some bruising but it is difficult to distinguish a discrete mass. She also has some nodularity laterally in the left breast. She has a large palpable lymph node that is mobile in the right axilla. There is no palpable cervical or supraclavicular lymphadenopathy  Abdominal: Soft. Bowel sounds are normal. She exhibits no mass. There is no tenderness.  Musculoskeletal: Normal range of motion.  Lymphadenopathy:  She has no cervical adenopathy.  Neurological: She is alert and oriented to person, place, and time.  Skin: Skin is warm and dry.  Psychiatric: She has a normal mood and affect. Her behavior is normal.      Assessment:     The patient has at least a stage II right breast cancer with a positive lymph node. I have discussed the options for surgical treatment of her cancer in detail with her and she is unsure whether she would favor breast conservation  and mastectomy. She would like to go ahead with the Second Look biopsy for the second lesion in the right breast and I think this is a reasonable thing to do. Because of her positive lymph node she will require a port for chemotherapy. I have discussed this in detail with her including the risks and benefits of placing the port and she understands     Plan:     We will plan to have a second look biopsy done of the second area in the right breast and at that point we will have another conference with the family to decide on a final plan for her treatment.    Second area was positive. Plan for right modified radical mastectomy and port placement Default, Provider, MD 03/15/2012 10:54 AM Signed       Scan on 03/15/2012 10:54 AM by Provider Default, MDScan on 03/15/2012 10:54 AM by Provider Default, MD          Not recorded         Transcription       Office Visit by Provider Default, MD                    Document Information                             Level of Service     PR OFFICE CONSULTATION NEW/ESTAB PATIENT 60 MIN [91478]                                   All Charges for This Encounter       Code  Description  Service Date  Service Provider  Modifiers  Quantity    [phone]  PR OFFICE OUTPATIENT NEW 45 MINUTES  03/14/2012  Robyne Askew, MD   1                Other Encounter Related Information     Allergies & Medications      Problem List      History      Patient-Entered Questionnaires      Printed AVS Reports     No AVS reports have been printed for this encounter.          No data filed

## 2012-05-10 NOTE — Anesthesia Preprocedure Evaluation (Addendum)
Anesthesia Evaluation  Patient identified by MRN, date of birth, ID band Patient awake    Reviewed: Allergy & Precautions, H&P , NPO status , Patient's Chart, lab work & pertinent test results, reviewed documented beta blocker date and time   History of Anesthesia Complications (+) PROLONGED EMERGENCE  Airway Mallampati: II TM Distance: >3 FB Neck ROM: full    Dental   Pulmonary neg pulmonary ROS,  breath sounds clear to auscultation        Cardiovascular hypertension, On Medications Rhythm:regular     Neuro/Psych PSYCHIATRIC DISORDERS Anxiety negative neurological ROS     GI/Hepatic Neg liver ROS, hiatal hernia, GERD-  ,  Endo/Other  negative endocrine ROS  Renal/GU Renal disease  negative genitourinary   Musculoskeletal   Abdominal   Peds  Hematology  (+) anemia ,   Anesthesia Other Findings See surgeon's H&P   Reproductive/Obstetrics negative OB ROS                           Anesthesia Physical Anesthesia Plan  ASA: II  Anesthesia Plan: General   Post-op Pain Management:    Induction: Intravenous  Airway Management Planned: LMA  Additional Equipment:   Intra-op Plan:   Post-operative Plan: Extubation in OR  Informed Consent: I have reviewed the patients History and Physical, chart, labs and discussed the procedure including the risks, benefits and alternatives for the proposed anesthesia with the patient or authorized representative who has indicated his/her understanding and acceptance.   Dental Advisory Given  Plan Discussed with: CRNA and Surgeon  Anesthesia Plan Comments:         Anesthesia Quick Evaluation

## 2012-05-10 NOTE — Anesthesia Procedure Notes (Signed)
Procedure Name: LMA Insertion Date/Time: 05/10/2012 7:42 AM Performed by: Elon Alas Pre-anesthesia Checklist: Timeout performed, Patient identified, Emergency Drugs available, Suction available and Patient being monitored Patient Re-evaluated:Patient Re-evaluated prior to inductionOxygen Delivery Method: Circle system utilized Preoxygenation: Pre-oxygenation with 100% oxygen Intubation Type: IV induction Ventilation: Mask ventilation without difficulty LMA: LMA inserted LMA Size: 4.0 Number of attempts: 1 Placement Confirmation: positive ETCO2 and breath sounds checked- equal and bilateral Tube secured with: Tape Dental Injury: Teeth and Oropharynx as per pre-operative assessment

## 2012-05-10 NOTE — Anesthesia Postprocedure Evaluation (Signed)
Anesthesia Post Note  Patient: Heather [name]  Procedure(s) Performed: Procedure(s) (LRB): MASTECTOMY MODIFIED RADICAL (Right) INSERTION PORT-A-CATH (Left)  Anesthesia type: General  Patient location: PACU  Post pain: Pain level controlled  Post assessment: Patient's Cardiovascular Status Stable  Last Vitals:  Filed Vitals:   05/10/12 1000  BP: 120/63  Pulse: 84  Temp: 36.6 C  Resp: 13    Post vital signs: Reviewed and stable  Level of consciousness: alert  Complications: No apparent anesthesia complications

## 2012-05-10 NOTE — Op Note (Signed)
05/10/2012  9:45 AM  PATIENT:  Heather [name]  67 y.o. female  PRE-OPERATIVE DIAGNOSIS:  RIGHT BREAST CANCER  POST-OPERATIVE DIAGNOSIS:  RIGHT BREAST CANCER  PROCEDURE:  Procedure(s) (LRB) with comments: MASTECTOMY MODIFIED RADICAL (Right) - RIGHT MODIFIED RADICAL MASTECTOMY INSERTION PORT-A-CATH (Left)  SURGEON:  Surgeon(s) and Role:    * Robyne Askew, MD - Primary    * Romie Levee, MD - Assisting  PHYSICIAN ASSISTANT:   ASSISTANTS: Dr. Maisie Fus   ANESTHESIA:   general  EBL:  Total I/O In: 1200 [I.V.:1200] Out: 25 [Blood:25]  BLOOD ADMINISTERED:none  DRAINS: (2) Jackson-Pratt drain(s) with closed bulb suction in the prepectoral space   LOCAL MEDICATIONS USED:  MARCAINE     SPECIMEN:  Source of Specimen:  right breast and axillary contents  DISPOSITION OF SPECIMEN:  PATHOLOGY  COUNTS:  YES  TOURNIQUET:  * No tourniquets in log *  DICTATION: .Dragon Dictation After informed consent was obtained patient brought to the operating room and placed in the supine position on the operating table. After adequate induction of general anesthesia a roll was placed between the patient's shoulder blades to extend the shoulder slightly. Next the patient's bilateral chest, breast, and axillary areas were prepped with ChloraPrep, allowed to dry, and draped in usual sterile manner. The patient was placed in Trendelenburg and initially the port was placed. The area on the left chest wall lateral to the bend of the clavicle was infiltrated with quarter percent Marcaine. A small stab incision was made with a 15 blade knife lateral to the bend of the clavicle. A large bore finder needle from the port kit was used to slide beneath the bend of the clavicle and towards the sternal notch and in doing so we were able to access the left subclavian vein without difficulty. A wire was fed through the needle without difficulty. The wire was confirmed in the central venous system using real-time  fluoroscopy. Next the incision on the left chest wall was extended slightly. A subcutaneous pocket was made inferior to the incision and using blunt finger dissection and some sharp dissection with the electrocautery. The tubing was then attached to the reservoir and the reservoir was placed in the pocket. The length of the tubing was estimated using real-time fluoroscopy. The tubing was then cut to length. Next a sheath and dilator were placed over the wire using the Seldinger technique without difficulty. The wire and dilator were then removed. The tubing was fed through the sheath as far as it could be fed and then held in place while the sheath was gently cracked and separated. Another real-time fluoroscopy image showed the tip of the catheter to be in the superior vena cava in good position. The port was then anchored in the pocket with 2 2-0 Prolene stitches. The port was aspirated and it aspirated blood easily. The port was then flushed initially with a dilute heparin solution and then with more concentrated heparin solution. The subcutaneous tissue was closed over the port with interrupted 3-0 Vicryl stitches. Prior to this the anchor was used to permanently attached the tubing to the reservoir. The skin was then closed with a running 4-0 Monocryl subcuticular stitch. Dermabond dressings were applied. The patient tolerated this portion of the procedure well. Attention was then turned to the right breast. A curvilinear elliptical incision was mapped out around the nipple and areola complex. The incision was then made with a 10 blade knife along the mapped lines. The incision was carried  through the skin and subcutaneous tissue sharply with the electrocautery. Breast hooks were used to elevate the skin flaps anteriorly towards the ceiling. Thin skin flaps were then created between the subcutaneous fat and the breast fat. This was done sharply with the electrocautery. The dissection was carried out  circumferentially down to the chest wall. Once this was accomplished the breast was then removed from the pectoralis muscle the pectoralis fascia. This was also done sharply with the electrocautery. Laterally the dissection was carried to the latissimus muscle. Medially the serratus muscle was identified and superiorly we were able to identify the axillary vein. The lymphatic contents within these boundaries was teased out gently with a right angle clamp. several small vessels were controlled with clips. The long thoracic and thoracodorsal neurovascular complexes were identified and spared. Once this was accomplished then the right breast and axillary contents were removed en bloc and sent to pathology for further evaluation. Hemostasis was achieved using Bovie electrocautery. The wound was irrigated with copious amounts of saline. 2 small stab incisions were then made near the anterior axillary line inferior to the operative bed. A tonsil clamp was placed to each one of these openings and used to bring a 19 [place] round Blake drain into the operative bed. The lateral drain was placed in the axilla. The medial drain was placed along the chest wall. The drains were anchored to the skin with 3-0 nylon stitches. Next the subcutaneous tissue was grossly reapproximated with interrupted 3-0 Vicryl stitches. The skin was then closed with a running 4-0 Monocryl subcuticular stitch. Dermabond dressings were applied. The patient tolerated the procedure well. The drains were placed to bulb suction and there was a good seal no drains. The patient was then awakened and taken to recovery in stable condition.  PLAN OF CARE: Admit for overnight observation  PATIENT DISPOSITION:  PACU - hemodynamically stable.   Delay start of Pharmacological VTE agent (>24hrs) due to surgical blood loss or risk of bleeding: yes

## 2012-05-10 NOTE — Transfer of Care (Signed)
Immediate Anesthesia Transfer of Care Note  Patient: Heather [name]  Procedure(s) Performed: Procedure(s) (LRB) with comments: MASTECTOMY MODIFIED RADICAL (Right) - RIGHT MODIFIED RADICAL MASTECTOMY INSERTION PORT-A-CATH (Left)  Patient Location: PACU  Anesthesia Type:General  Level of Consciousness: awake, alert  and oriented  Airway & Oxygen Therapy: Patient Spontanous Breathing and Patient connected to nasal cannula oxygen  Post-op Assessment: Report given to PACU RN, Post -op Vital signs reviewed and stable and Patient moving all extremities X 4  Post vital signs: Reviewed and stable  Complications: No apparent anesthesia complications

## 2012-05-10 NOTE — Preoperative (Signed)
Beta Blockers   Reason not to administer Beta Blockers:Not Applicable 

## 2012-05-11 ENCOUNTER — Ambulatory Visit: Payer: Medicare Other | Admitting: Oncology

## 2012-05-11 ENCOUNTER — Other Ambulatory Visit: Payer: Medicare Other | Admitting: Lab

## 2012-05-11 ENCOUNTER — Ambulatory Visit: Payer: Medicare Other

## 2012-05-11 LAB — CBC
HCT: 37.1 % (ref 36.0–46.0)
Hemoglobin: 12.1 g/dL (ref 12.0–15.0)
MCH: 28.1 pg (ref 26.0–34.0)
MCHC: 32.6 g/dL (ref 30.0–36.0)
MCV: 86.3 fL (ref 78.0–100.0)
Platelets: 179 10*3/uL (ref 150–400)
RBC: 4.3 MIL/uL (ref 3.87–5.11)
RDW: 12.9 % (ref 11.5–15.5)
WBC: 7.5 10*3/uL (ref 4.0–10.5)

## 2012-05-11 LAB — BASIC METABOLIC PANEL
BUN: 12 mg/dL (ref 6–23)
CO2: 20 mEq/L (ref 19–32)
Calcium: 9.7 mg/dL (ref 8.4–10.5)
Chloride: 99 mEq/L (ref 96–112)
Creatinine, Ser: 0.88 mg/dL (ref 0.50–1.10)
GFR calc Af Amer: 78 mL/min — ABNORMAL LOW (ref >90)
GFR calc non Af Amer: 67 mL/min — ABNORMAL LOW (ref >90)
Glucose, Bld: 116 mg/dL — ABNORMAL HIGH (ref 70–99)
Potassium: 4.7 mEq/L (ref 3.5–5.1)
Sodium: 138 mEq/L (ref 135–145)

## 2012-05-11 NOTE — Progress Notes (Signed)
General Surgery Note  LOS: 1 day  POD -  1 Day Post-Op  Assessment/Plan: 1.  MASTECTOMY MODIFIED RADICAL, INSERTION PORT-A-CATH - P. Carolynne Edouard - 05/10/2012  Discharge instructions reviewed with patient.  Ready to go home.  To be seen by Dr. Carolynne Edouard in approx one week.  Subjective:  Doing well.  Lots of questions.  Husband and daughter in room. Objective:   Filed Vitals:   05/11/12 1016  BP:   Pulse:   Temp:   Resp: 18     Intake/Output from previous day:  01/02 0701 - 01/03 0700 In: 1440 [P.O.:240; I.V.:1200] Out: 2935 [Urine:2650; Drains:260; Blood:25]  Intake/Output this shift:      Physical Exam:   General: WN WF who is alert and oriented.    HEENT: Normal. Pupils equal. .   Breasts:  Wrapped.  Dressing dry.  Two drains with minimal output.   Lab Results:    [facility] 05/11/12 0853  WBC 7.5  HGB 12.1  HCT 37.1  PLT 179    BMET   Basename 05/11/12 0640  NA 138  K 4.7  CL 99  CO2 20  GLUCOSE 116*  BUN 12  CREATININE 0.88  CALCIUM 9.7    PT/INR  No results found for this basename: LABPROT:2,INR:2 in the last 72 hours  ABG  No results found for this basename: PHART:2,PCO2:2,PO2:2,HCO3:2 in the last 72 hours   Studies/Results:  Dg Chest Port 1 View  05/10/2012  *RADIOLOGY REPORT*  Clinical Data: Status status post port catheter insertion.  Breast cancer.  PORTABLE CHEST - 1 VIEW  Comparison: 05/04/2012.  Findings: Interval left subclavian porta catheter with its tip in the superior vena cava.  No pneumothorax.  Interval post mastectomy changes on the right with axillary and right lateral surgical clips and small amount of soft tissue air.  The heart remains normal in size and the lungs remain hyperexpanded and clear with the exception of minimal linear atelectasis in the left lower lobe. Diffuse osteopenia.  IMPRESSION:  1.  Left subclavian porta catheter tip in the superior vena cava without pneumothorax. 2.  Interval post mastectomy changes on the right. 3.  Interval  minimal left lower lobe atelectasis. 4.  Stable changes of COPD.   Original Report Authenticated By: Beckie Salts, M.D.    Dg Fluoro Guide Cv Line-no Report  05/10/2012  CLINICAL DATA: Port-A-Cath Insertion   FLOURO GUIDE CV LINE  Fluoroscopy was utilized by the requesting physician.  No radiographic  interpretation.       Anti-infectives:   Anti-infectives     Start     Dose/Rate Route Frequency Ordered Stop   05/10/12 0600   vancomycin (VANCOCIN) IVPB 1000 mg/200 mL premix        1,000 mg 200 mL/hr over 60 Minutes Intravenous On call to O.R. 05/09/12 2013 05/10/12 1610          Ovidio Kin, MD, FACS Pager: [phone],   [facility] Office: [phone] 05/11/2012

## 2012-05-11 NOTE — Progress Notes (Signed)
DC HOME WITH FAMILY, VERBALLY UNDERSTOOD DC INSTRUCTIONS, NO QUESTIONS ASKED, DSG SUPPLIES SENT WITH PT, STATES SHE FEELS COMFORTABLE  TAKING CARE OF JP DRAINS

## 2012-05-12 ENCOUNTER — Ambulatory Visit: Payer: Medicare Other

## 2012-05-14 ENCOUNTER — Encounter (HOSPITAL_COMMUNITY): Payer: Self-pay | Admitting: General Surgery

## 2012-05-15 NOTE — Discharge Summary (Signed)
Physician Discharge Summary  Patient ID: Heather [name] MRN: [id number] DOB/AGE: June 06, 1945 68 y.o.  Admit date: 05/10/2012 Discharge date: 05/15/2012  Admission Diagnoses:  Discharge Diagnoses:  Active Problems:  * No active hospital problems. *    Discharged Condition: good  Hospital Course: the patient underwent right modified radical mastectomy and placement of a Port-A-Cath. She tolerated the operation well. She was kept overnight for observation and on postop day 1 was ready for discharge home.  Consults: None  Significant Diagnostic Studies: none  Treatments: surgery: as above  Discharge Exam: Blood pressure 103/58, pulse 76, temperature 98 F (36.7 C), temperature source Oral, resp. rate 18, height 5\' 4"  (1.626 m), weight 135 lb 12.9 oz (61.6 kg), SpO2 100.00%. Chest wall: skin flaps were viable  Disposition: 01-Home or Self Care  Discharge Orders    Future Appointments: Provider: Department: Dept Phone: Center:   05/17/2012 2:45 PM Robyne Askew, MD [facility], [place] [phone] None   05/18/2012 12:00 PM Krista [facility] [phone] None   05/18/2012 12:30 PM Victorino December, MD [facility] [phone] None     Future Orders Please Complete By Expires   Diet - low sodium heart healthy      Diet - low sodium heart healthy      Increase activity slowly      Increase activity slowly          Medication List     As of 05/15/2012 12:07 PM    TAKE these medications         calcium carbonate 500 MG chewable tablet   Commonly known as: TUMS - dosed in mg elemental calcium   Chew 1 tablet by mouth daily.      magnesium gluconate 500 MG tablet   Commonly known as: MAGONATE   Take 500 mg by mouth 2 (two) times daily.      oxyCODONE-acetaminophen 5-325 MG per tablet   Commonly known as: PERCOCET/ROXICET   Take 1 tablet by mouth every 4 (four) hours as needed for pain.     PREVIDENT 5000 BOOSTER PLUS 1.1 % Pste   Generic drug: Sodium Fluoride   Take 1 application by mouth Twice daily.      PROBIOTIC DAILY PO   Take 1 each by mouth.      spironolactone 25 MG tablet   Commonly known as: ALDACTONE   Take 0.5 tablets (12.5 mg total) by mouth daily.      SUPER B COMPLEX PO   Take 1 each by mouth daily.      VITAMIN D-3 PO   Take 4,000 Units by mouth daily.           Follow-up Information    Follow up with TOTH III,Malayna Noori S, MD. In 1 week.   Contact information:   [address] [place] [address] [phone]          Signed: Robyne Askew 05/15/2012, 12:07 PM

## 2012-05-17 ENCOUNTER — Ambulatory Visit (INDEPENDENT_AMBULATORY_CARE_PROVIDER_SITE_OTHER): Payer: Medicare Other | Admitting: General Surgery

## 2012-05-17 ENCOUNTER — Encounter (INDEPENDENT_AMBULATORY_CARE_PROVIDER_SITE_OTHER): Payer: Self-pay | Admitting: General Surgery

## 2012-05-17 VITALS — BP 150/80 | HR 88 | Temp 99.4°F | Resp 20 | Ht 64.0 in | Wt 133.4 lb

## 2012-05-17 DIAGNOSIS — C50419 Malignant neoplasm of upper-outer quadrant of unspecified female breast: Secondary | ICD-10-CM

## 2012-05-17 NOTE — Patient Instructions (Signed)
Continue to record output from drain Sponge bathe while drain is in

## 2012-05-17 NOTE — Progress Notes (Signed)
Subjective:     Patient ID: Heather [name], female   DOB: 04-Mar-1946, 67 y.o.   MRN: [id number]  HPI The patient is a 67 year old white female who is one-week status post right mastectomy and axillary lymph node dissection for a T2 N1 a right breast cancer. She has done well since the surgery and has no complaints today.  Review of Systems     Objective:   Physical Exam On exam her skin flaps on her right chest wall are healthy. Her mastectomy incision is healing nicely with no sign of infection. Drain number one along the chest wall was removed today since the output was low and she tolerated this well. Drain #2 was left intact in the axilla.    Assessment:     1 week status post right modified radical mastectomy for right breast cancer    Plan:     At this point we will plan to see her back next week to remove the second drain. I would like her to sponge bathe while the drain is then. I would like her to refrain from overhead activity.

## 2012-05-18 ENCOUNTER — Ambulatory Visit: Payer: Medicare Other

## 2012-05-18 ENCOUNTER — Telehealth: Payer: Self-pay | Admitting: Oncology

## 2012-05-18 ENCOUNTER — Telehealth: Payer: Self-pay | Admitting: *Deleted

## 2012-05-18 ENCOUNTER — Other Ambulatory Visit (HOSPITAL_BASED_OUTPATIENT_CLINIC_OR_DEPARTMENT_OTHER): Payer: Medicare Other | Admitting: Lab

## 2012-05-18 ENCOUNTER — Ambulatory Visit (HOSPITAL_BASED_OUTPATIENT_CLINIC_OR_DEPARTMENT_OTHER): Payer: Medicare Other | Admitting: Oncology

## 2012-05-18 ENCOUNTER — Encounter: Payer: Self-pay | Admitting: Oncology

## 2012-05-18 VITALS — BP 146/83 | HR 80 | Temp 97.8°F | Resp 20 | Ht 64.0 in | Wt 132.8 lb

## 2012-05-18 DIAGNOSIS — C50919 Malignant neoplasm of unspecified site of unspecified female breast: Secondary | ICD-10-CM

## 2012-05-18 DIAGNOSIS — C50419 Malignant neoplasm of upper-outer quadrant of unspecified female breast: Secondary | ICD-10-CM

## 2012-05-18 LAB — CBC WITH DIFFERENTIAL/PLATELET
BASO%: 0.9 % (ref 0.0–2.0)
Basophils Absolute: 0.1 10*3/uL (ref 0.0–0.1)
EOS%: 5 % (ref 0.0–7.0)
Eosinophils Absolute: 0.3 10*3/uL (ref 0.0–0.5)
HCT: 39.5 % (ref 34.8–46.6)
HGB: 13.4 g/dL (ref 11.6–15.9)
LYMPH%: 21 % (ref 14.0–49.7)
MCH: 28.9 pg (ref 25.1–34.0)
MCHC: 34 g/dL (ref 31.5–36.0)
MCV: 85 fL (ref 79.5–101.0)
MONO#: 0.5 10*3/uL (ref 0.1–0.9)
MONO%: 8.3 % (ref 0.0–14.0)
NEUT#: 4 10*3/uL (ref 1.5–6.5)
NEUT%: 64.8 % (ref 38.4–76.8)
Platelets: 185 10*3/uL (ref 145–400)
RBC: 4.64 10*6/uL (ref 3.70–5.45)
RDW: 13.3 % (ref 11.2–14.5)
WBC: 6.2 10*3/uL (ref 3.9–10.3)
lymph#: 1.3 10*3/uL (ref 0.9–3.3)

## 2012-05-18 LAB — COMPREHENSIVE METABOLIC PANEL (CC13)
ALT: 19 U/L (ref 0–55)
AST: 22 U/L (ref 5–34)
Albumin: 3.4 g/dL — ABNORMAL LOW (ref 3.5–5.0)
Alkaline Phosphatase: 96 U/L (ref 40–150)
BUN: 22 mg/dL (ref 7.0–26.0)
CO2: 30 mEq/L — ABNORMAL HIGH (ref 22–29)
Calcium: 9.8 mg/dL (ref 8.4–10.4)
Chloride: 98 mEq/L (ref 98–107)
Creatinine: 0.9 mg/dL (ref 0.6–1.1)
Glucose: 86 mg/dl (ref 70–99)
Potassium: 4.1 mEq/L (ref 3.5–5.1)
Sodium: 137 mEq/L (ref 136–145)
Total Bilirubin: 0.5 mg/dL (ref 0.20–1.20)
Total Protein: 7.5 g/dL (ref 6.4–8.3)

## 2012-05-18 MED ORDER — PROCHLORPERAZINE 25 MG RE SUPP: 25.0000 mg | Freq: Two times a day (BID) | RECTAL | Status: DC | PRN

## 2012-05-18 MED ORDER — LORAZEPAM 0.5 MG PO TABS: 0.5000 mg | ORAL_TABLET | Freq: Four times a day (QID) | ORAL | Status: DC | PRN

## 2012-05-18 MED ORDER — PROCHLORPERAZINE MALEATE 10 MG PO TABS: 10.0000 mg | ORAL_TABLET | Freq: Four times a day (QID) | ORAL | Status: DC | PRN

## 2012-05-18 MED ORDER — DEXAMETHASONE 4 MG PO TABS: 8.0000 mg | ORAL_TABLET | Freq: Two times a day (BID) | ORAL | Status: DC

## 2012-05-18 MED ORDER — ONDANSETRON HCL 8 MG PO TABS: 8.0000 mg | ORAL_TABLET | Freq: Two times a day (BID) | ORAL | Status: DC

## 2012-05-18 MED ORDER — LIDOCAINE-PRILOCAINE 2.5-2.5 % EX CREA: TOPICAL_CREAM | CUTANEOUS | Status: DC | PRN

## 2012-05-18 NOTE — Telephone Encounter (Signed)
appts made and pritnd for pt pt aware that tx will be added and mw will add tx     anne

## 2012-05-18 NOTE — Progress Notes (Signed)
OFFICE PROGRESS NOTE  CC  Heather Bradford, MD [address] [place] [address] Dr. Chevis Pretty Dr. Chipper Herb  DIAGNOSIS: 67 year old female with invasive ductal carcinoma grade 2-3 at the 10:00 position with positive lymph node for metastatic disease.  PRIOR THERAPY:  #1 patient was originally seen in the multidisciplinary breast clinic on 03/14/2012 for new diagnosis of invasive ductal carcinoma that was HER-2 positive node-positive, ER positive PR negative with Ki-67 of 44%  #2 on MRI patient also had an anterior breast mass that was to be biopsied to decide whether or not she should receive neoadjuvant chemotherapy or adjuvant chemotherapy. Patient'[name] mass was biopsied and it was positive for a malignancy that was HER-2/neu negative.  #3 patient is now status post right mastectomy with axillary lymph node dissection with the final pathology revealing the largest tumor measuring 5 cm, 1.7 cm and 1.6 cm. Tumor was ER positive PR negative HER-2/neu positive with a Ki-67 of 44% 3 of 7 lymph nodes were positive for metastatic disease.  #4 patient will proceed with adjuvant chemotherapy and Herceptin. Chemotherapy will consist of Taxotere carboplatinum given every 21 days with weekly Herceptin. Total of 6 cycles of Taxotere carboplatin will be administered starting on 06/14/2012. Once patient completes her chemotherapy then she will go on to receive radiation therapy along with Herceptin with the Herceptin then being converted to every 3 weeks to finish a total of one year. Because patient'[name] tumor is ER positive she will also be given antiestrogen therapy adjuvantly.  CURRENT THERAPY:Cycle 1 day 1 of TCH on 06/14/2012.  INTERVAL HISTORY: Heather [name] 67 y.o. female returns for followup visit After her mastectomy. Overall she tolerated the mastectomy well without any significant problems she still has to drains in she is gong to have these taken out soon. We today discussed in detail  her chemotherapy regimen we discussed side effects. Patient is still having some pain at the surgical site. Otherwise the surgical site looks terrific. She denies any nausea vomiting fevers chills night sweats headaches no bone no pain no peripheral paresthesias. Remainder of the 10 point review of systems is negative.  MEDICAL HISTORY: Past Medical History  Diagnosis Date  . Breast cancer   . Skin cancer   . Chronic kidney disease   . Hx: UTI (urinary tract infection)   . Right shoulder pain   . Right knee pain   . Hypertension     recently started Aldactone   . Hyperlipidemia     but not on meds;diet and exercise controlled  . Dizziness     has been going on for 44months;medical MD aware  . H/O hiatal hernia   . GERD (gastroesophageal reflux disease)     Tums prn  . Hemorrhoids   . Urinary frequency     d/t taking Aldactone  . History of UTI   . Anemia     hx of  . Cataract     immature-not sure of which eye  . Anxiety     r/t updated surgery  . Insomnia     takes Melatoniin daily  . Complication of anesthesia     pt states she is sensitive to meds    ALLERGIES:  is allergic to codeine; keflex; naprosyn; and tussin.  MEDICATIONS:  Current Outpatient Prescriptions  Medication Sig Dispense Refill  . B Complex-C (SUPER B COMPLEX PO) Take 1 each by mouth daily.      . calcium carbonate (TUMS - DOSED IN MG ELEMENTAL  CALCIUM) 500 MG chewable tablet Chew 1 tablet by mouth daily.      . Cholecalciferol (VITAMIN D-3 PO) Take 4,000 Units by mouth daily.      Marland Kitchen HYDROcodone-acetaminophen (NORCO/VICODIN) 5-325 MG per tablet Take 1 tablet by mouth every 6 (six) hours as needed.      . magnesium gluconate (MAGONATE) 500 MG tablet Take 500 mg by mouth 2 (two) times daily.      Marland Kitchen PREVIDENT 5000 BOOSTER PLUS 1.1 % PSTE Take 1 application by mouth Twice daily.      . Probiotic Product (PROBIOTIC DAILY PO) Take 1 each by mouth.      . spironolactone (ALDACTONE) 25 MG tablet Take 0.5  tablets (12.5 mg total) by mouth daily.  30 tablet  3    SURGICAL HISTORY:  Past Surgical History  Procedure Date  . Cyst removed from left breast 1970  . Left wrist surgery  2004    with plate  . Colonoscopy   . Esophagogastroduodenoscopy   . Mastectomy, radical 05/11/2011    right   . Mastectomy modified radical 05/10/2012    Procedure: MASTECTOMY MODIFIED RADICAL;  Surgeon: Robyne Askew, MD;  Location: [facility] OR;  Service: General;  Laterality: Right;  RIGHT MODIFIED RADICAL MASTECTOMY  . Portacath placement 05/10/2012    Procedure: INSERTION PORT-A-CATH;  Surgeon: Robyne Askew, MD;  Location: [facility] OR;  Service: General;  Laterality: Left;  . Wrist fracture surgery 2004    REVIEW OF SYSTEMS:  Pertinent items are noted in HPI.   HEALTH MAINTENANCE: PHYSICAL EXAMINATION: Blood pressure 146/83, pulse 80, temperature 97.8 F (36.6 C), resp. rate 20, height 5\' 4"  (1.626 m), weight 132 lb 12.8 oz (60.238 kg). Body mass index is 22.80 kg/(m^2). ECOG PERFORMANCE STATUS: 0 - Asymptomatic HEENT exam is on my PERRLA sclerae anicteric no conjunctival pallor oral mucosa is moist neck is supple lungs are clear to auscultation and percussion cardiovascular is regular rate rhythm no murmurs abdomen is soft nontender nondistended bowel sounds are present no HSM extremities no edema neuro is nonfocal right mastectomy scar looks to be healing drains are still present.   LABORATORY DATA: Lab Results  Component Value Date   WBC 6.2 05/18/2012   HGB 13.4 05/18/2012   HCT 39.5 05/18/2012   MCV 85.0 05/18/2012   PLT 185 05/18/2012      Chemistry      Component Value Date/Time   NA 137 05/18/2012 1159   NA 138 05/11/2012 0640   K 4.1 05/18/2012 1159   K 4.7 05/11/2012 0640   CL 98 05/18/2012 1159   CL 99 05/11/2012 0640   CO2 30* 05/18/2012 1159   CO2 20 05/11/2012 0640   BUN 22.0 05/18/2012 1159   BUN 12 05/11/2012 0640   CREATININE 0.9 05/18/2012 1159   CREATININE 0.88 05/11/2012 0640      Component Value  Date/Time   CALCIUM 9.8 05/18/2012 1159   CALCIUM 9.7 05/11/2012 0640   ALKPHOS 96 05/18/2012 1159   AST 22 05/18/2012 1159   ALT 19 05/18/2012 1159   BILITOT 0.50 05/18/2012 1159     ADDITIONAL INFORMATION: CHROMOGENIC IN-SITU HYBRIDIZATION (1C - TUMOR WITH CLIP) Interpretation HER-2/NEU BY CISH - NO AMPLIFICATION OF HER-2 DETECTED. THE RATIO OF HER-2: CEP 17 SIGNALS WAS 1.25. Reference range: Ratio: HER2:CEP17 < 1.8 - gene amplification not observed Ratio: HER2:CEP 17 1.8-2.2 - equivocal result Ratio: HER2:CEP17 > 2.2 - gene amplification observed CHROMOGENIC IN-SITU HYBRIDIZATION (1E - TUMOR WITHOUT  CLIP) Interpretation: HER2/NEU BY CISH - SHOWS AMPLIFICATION BY CISH ANALYSIS. THE RATIO OF HER2: CEP 17 SIGNALS WAS 2.83. Reference range: Ratio: HER2:CEP17 < 1.8 gene amplification not observed Ratio: HER2:CEP 17 1.8-2.2 - equivocal result Ratio: HER2:CEP17 > 2.2 - gene amplification observed Pecola Leisure MD Pathologist, Electronic Signature ( Signed 05/15/2012) FINAL DIAGNOSIS Diagnosis Breast, modified radical mastectomy , right, with axillary contents 1 of 4 FINAL for Heather [name], Heather [name] ([id number]) Diagnosis(continued) TUMOR #1 (WITH CLIP): - INVASIVE LOBULAR CARCINOMA, GRADE II, SEE COMMENT. - LYMPHOVASCULAR INVASION IDENTIFIED. - PREVIOUS BIOPSY SITE IDENTIFIED. TUMOR #2 (NO CLIP): - INVASIVE LOBULAR CARCINOMA, GRADE II, SEE COMMENT. - LOBULAR CARCINOMA IN SITU. - LYMPHOVASCULAR INVASION IDENTIFIED. - THREE LYMPH NODES, POSITIVE FOR METASTATIC MAMMARY CARCINOMA (3/7). - TUMOR DEPOSITS ARE 1.7, 1.6, AND 5 CM. - EXTRACAPSULAR TUMOR EXTENSION IDENTIFIED. - TWO LYMPH NODES, POSITIVE FOR ISOLATED TUMOR CELLS. Microscopic Comment BREAST, INVASIVE TUMOR, WITH LYMPH NODE SAMPLING Specimen, including laterality: Right breast Procedure: Modified radical mastectomy Grade: Tumor 1 and tumor 2: Grade II of III Tubule formation: 3 Nuclear pleomorphism: 2 Mitotic: 1 Tumor size (gross  measurement: Tumor #1- 2.3 cm; tumor #2 - 2.4 cm Margins: Invasive, distance to closest margin: Tumor #1 2.7 cm - deep margin; tumor #2 - 1.6 cm - deep margin Lymphovascular invasion: Present Ductal carcinoma in situ: Absent Grade: N/A Extensive intraductal component: N/A Lobular neoplasia: Both atypical hyperplasia and in situ carcinoma Extent of tumor: Skin: Grossly negative Nipple: Grossly negative Skeletal muscle: Microscopically negative Lymph nodes: # examined: 7 Lymph nodes with metastasis: 3 Isolated tumor cells (< 0.2 mm): two lymph nodes (Slide 1P and 1O) Micrometastasis: (> 0.2 mm and < 2.0 mm): none Macrometastasis: (> 2.0 mm): 1.7 cm , 1.6 cm and 5 cm Extracapsular extension: Present Breast prognostic profile: Tumor #1: Estrogen receptor: Not repeated, previous study demonstrated 98% positivity ([id number]) Progesterone receptor: Not repeated, previous study demonstrated 0% positivity ([id number]) Her 2 neu: Repeated, previous study demonstrated no amplification (1.38) ([id number]) Ki-67: Not repeated, previous study demonstrated 5% proliferation rate ([id number]) Tumor #2: Estrogen receptor: Not repeated, previous study demonstrated 97% positivity ([id number]) Progesterone receptor: Not repeated, previous study demonstrated 0% positivity ([id number]) Her 2 neu: Repeated, previous study demonstrated amplification (3.65) ([id number]) Ki-67: Not repeated, previous study demonstrated 44% proliferation rate ([id number]) 2 of 4 FINAL for Heather [name], Heather [name] ([id number]) Microscopic Comment(continued) Non-neoplastic breast: Benign fibrocystic change with usual ductal hyperplasia, sclerosing adenosis, benign radial sclerosing lesion, and microcalcifications in benign ducts and lobules. TNM: mpT2, pN1a, pMX. Comments: none  RADIOGRAPHIC STUDIES:  Mr Lodema Pilot Contrast  03/23/2012  *RADIOLOGY REPORT*  Clinical Data: New diagnosis breast cancer.  Rule out metastatic  disease.  MRI HEAD WITHOUT AND WITH CONTRAST  Technique:  Multiplanar, multiecho pulse sequences of the brain and surrounding structures were obtained according to standard protocol without and with intravenous contrast  Contrast: 12mL MULTIHANCE GADOBENATE DIMEGLUMINE 529 MG/ML IV SOLN  Comparison: None.  Findings: Ventricle size is normal.  Small 4 mm hyperintensities in the cerebral white matter bilaterally do not enhance and are most likely due to chronic microvascular ischemia.  Negative for acute infarct.  Diffusion weighted imaging is normal.  Pituitary is normal in size.  Negative for intracranial hemorrhage.  Following contrast infusion, no enhancing lesions are identified.  Paranasal sinuses are clear.  Vessels at the base of the brain are patent.  IMPRESSION: Negative for metastatic disease.  No acute abnormality.   Original Report Authenticated By: Janeece Riggers, M.D.  Mr Breast Bilateral W Wo Contrast  03/12/2012  *RADIOLOGY REPORT*  Clinical Data: Recently diagnosed invasive mammary carcinoma in the 10 o'clock region of the right breast with a metastatic right axillary lymph node.  BILATERAL BREAST MRI WITH AND WITHOUT CONTRAST  Technique: Multiplanar, multisequence MR images of both breasts were obtained prior to and following the intravenous administration of 12ml of multihance.  Three dimensional images were evaluated at the independent DynaCad workstation.  Comparison:  Mammograms dated 03/05/2012, 03/01/2012, 03/01/2011 and 02/26/2010 from [facility].  Findings: There is a moderate background parenchymal enhancement pattern.  Right breast: 1.  There is a lobulated 2.2 x 1.7 x 1.8 cm mass in the posterior third of the upper outer quadrant of the right breast.  There is a post biopsy clip artifact 1 cm lateral to the mass. 2.  In the anterior third upper outer quadrant of the right breast there is patchy non mass like enhancement measuring 0.8 x 0.3 x 0.3 cm.  Left breast:  There is no  abnormal enhancement the left breast.  There are enlarged right axillary lymph nodes.  The largest right axillary lymph node measures 2.4 cm and is located lateral to the pectoralis minor consistent with level I adenopathy.  There is a 1.6 cm lymph node behind the pectoralis minor consistent with enlarged level II adenopathy.  IMPRESSION:  1.  2.2 cm mass in the posterior third of the upper outer quadrant of the right breast corresponding with the known invasive mammary carcinoma. 2.  Patchy non mass-like enhancement in the anterior third of the upper outer quadrant of the right breast.  MR guided core biopsy is recommended. 3. Enlarged level I and II right axillary adenopathy.  RECOMMENDATION: MR guided core biopsy of the right breast.  THREE-DIMENSIONAL MR IMAGE RENDERING ON INDEPENDENT WORKSTATION:  Three-dimensional MR images were rendered by post-processing of the original MR data on an independent workstation.  The three- dimensional MR images were interpreted, and findings were reported in the accompanying complete MRI report for this study.  BI-RADS CATEGORY 4:  Suspicious abnormality - biopsy should be considered.   Original Report Authenticated By: Baird Lyons, M.D.    Mr Biopsy/wire Localization  03/20/2012  *RADIOLOGY REPORT*  Clinical Data:  Known right breast carcinoma.  Additional area of worrisome enhancement located within the anterior upper outer quadrant of the right breast.  MRI GUIDED VACUUM ASSISTED BIOPSY OF THE RIGHT BREAST WITHOUT AND WITH CONTRAST  Comparison: Previous exams.  Technique: Multiplanar, multisequence MR images of the right breast were obtained prior to and following the intravenous administration of 12 ml of Mulithance.  I met with the patient, and we discussed the procedure of MRI guided biopsy, including risks, benefits, and alternatives. Specifically, we discussed the risks of infection, bleeding, tissue injury, clip migration, and inadequate sampling.  Informed, written  consent was given.  Using sterile technique, 2% Lidocaine, MRI guidance, and a 9 gauge vacuum assisted device, biopsy was performed of the area of enhancement located within the anterior portion of the upper-outer quadrant - right breast using a lateral approach.  At the conclusion of the procedure, a  tissue marker clip was deployed into the biopsy cavity.  IMPRESSION: MRI guided biopsy of the area of enhancement located within the anterior portion of the upper-outer quadrant of the right breast as discussed above. No apparent complications.  BI-RADS CATEGORY 6:  Known biopsy-proven malignancy - appropriate action should be taken.  THREE-DIMENSIONAL MR IMAGE RENDERING ON INDEPENDENT WORKSTATION:  Three-dimensional MR images were rendered by post-processing of the original MR data on an independent workstation.  The three- dimensional MR images were interpreted, and findings were reported in the accompanying complete MRI report for this study.  The pathology associated with the right breast MR guided core biopsy demonstrated invasive mammary carcinoma and in situ mammary carcinoma.  This is concordant with imaging findings.  I have discussed findings with the patient by telephone and answered her questions.  The patient states that biopsy site is clean and dry without hematoma formation or signs of infection.  Post biopsy wound care instructions were reviewed with the patient.  Follow-up will be with Dr. Carolynne Edouard and Dr. Welton Flakes regards her treatment plan.  The patient was encouraged to call the Breast Center for additional questions or concerns   Original Report Authenticated By: Rolla Plate, M.D.    Nm Pet Image Initial (pi) Skull Base To Thigh  03/22/2012  *RADIOLOGY REPORT*  Clinical Data: Initial treatment strategy for high risk right breast cancer.  NUCLEAR MEDICINE PET SKULL BASE TO THIGH  Fasting Blood Glucose:  100  Technique:  18.6 mCi F-18 FDG was injected intravenously. CT data was obtained and used  for attenuation correction and anatomic localization only.  (This was not acquired as a diagnostic CT examination.) Additional exam technical data entered on technologist worksheet.  Comparison:  MRI breast dated 03/12/2012  Findings:  Neck: No hypermetabolic lymph nodes in the neck.  Chest:  Vague hypermetabolism in the upper/central right breast, max SUV 2.7.  Additional focal hypermetabolism in the upper/outer right breast, max SUV 2.3 (PET image 105).  These findings correspond to biopsy-proven primary breast carcinoma.  1.3 cm short axis right subpectoral lymph node (series 2/image 66), max SUV 3.1.  Right axillary lymphadenopathy measuring up to 1.6 cm short axis (series 2/image 88), max SUV 4.8).  No hypermetabolic mediastinal or hilar nodes.  No suspicious pulmonary nodules on the CT scan.  Abdomen/Pelvis:  No abnormal hypermetabolic activity within the liver, pancreas, adrenal glands, or spleen.  No hypermetabolic lymph nodes in the abdomen or pelvis.  Skeleton:  No focal hypermetabolic activity to suggest skeletal metastasis.  IMPRESSION: Two foci of hypermetabolism in the upper right breast, max SUV 2.7, corresponding to biopsy-proven primary breast carcinoma.  Associated right subpectoral and axillary nodal metastases, max SUV 4.8.  No evidence of distant metastases.   Original Report Authenticated By: Charline Bills, M.D.    Mm Digital Diagnostic Unilat R  03/20/2012  *RADIOLOGY REPORT*  Clinical Data:  Post right breast MR guided core biopsy.  DIGITAL DIAGNOSTIC RIGHT BREAST MAMMOGRAM  Comparison:  03/05/2012, 03/01/2012, 03/01/2011, 02/26/2010 mammograms from [facility] imaging.  Findings:  Films are performed following MR guided biopsy of the anterior portion of the upper-outer quadrant of the right breast. The hourglass shaped clip placed during the MR guided core biopsy appears in appropriate position.  IMPRESSION: Appropriate positioning of clip following right breast MR guided core biopsy.    Original Report Authenticated By: Rolla Plate, M.D.     ASSESSMENT: 67 year old female with  #1 new diagnosis of invasive lobular carcinoma that is ER positive and HER-2/neu positive.Patient is now status post mastectomy with right axillary lymph node dissection.  tumor measured 1.7 cm with the next one measuring 1.6 5 cm. 3 of 7 lymph nodes were positive for metastatic disease With extracapsular extension. Tumor was ER positive HER-2/neu positive  #2 patient is now seen for start of systemic chemotherapy consisting of Taxotere carboplatinum and Herceptin  given every 21 days for total of 6 cycles of Taxotere carboplatinum and weekly Herceptin.  #3 patient will need postmastectomy radiation therapy and I will refer the patient back once her chemotherapy is completed.Marland Kitchen  PLAN:  #1 patient will proceed with her chemotherapy consisting of TCH. Cycle 1 will be given on 06/14/2012. Risks and benefits of treatment were discussed with the patient. She did receive the anti-emetic prescriptions.  #2 patient will be seen back in followup on 06/14/2012.   All questions were answered. The patient knows to call the clinic with any problems, questions or concerns. We can certainly see the patient much sooner if necessary.  I spent 40 minutes counseling the patient face to face. The total time spent in the appointment was 30 minutes.    Drue Second, MD Medical/Oncology [facility] [phone] (beeper) [phone] (Office)  05/18/2012, 1:24 PM

## 2012-05-18 NOTE — Patient Instructions (Addendum)
Proceed with chemotherapy on 06/14/12  Docetaxel injection What is this medicine? DOCETAXEL (doe se TAX el) is a chemotherapy drug. It targets fast dividing cells, like cancer cells, and causes these cells to die. This medicine is used to treat many types of cancers like breast cancer, certain stomach cancers, head and neck cancer, lung cancer, and prostate cancer. This medicine may be used for other purposes; ask your health care provider or pharmacist if you have questions. What should I tell my health care provider before I take this medicine? They need to know if you have any of these conditions: -infection (especially a virus infection such as chickenpox, cold sores, or herpes) -liver disease -low blood counts, like low white cell, platelet, or red cell counts -an unusual or allergic reaction to docetaxel, polysorbate 80, other chemotherapy agents, other medicines, foods, dyes, or preservatives -pregnant or trying to get pregnant -breast-feeding How should I use this medicine? This drug is given as an infusion into a vein. It is administered in a hospital or clinic by a specially trained health care professional. Talk to your pediatrician regarding the use of this medicine in children. Special care may be needed. Overdosage: If you think you have taken too much of this medicine contact a poison control center or emergency room at once. NOTE: This medicine is only for you. Do not share this medicine with others. What if I miss a dose? It is important not to miss your dose. Call your doctor or health care professional if you are unable to keep an appointment. What may interact with this medicine? -cyclosporine -erythromycin -ketoconazole -medicines to increase blood counts like filgrastim, pegfilgrastim, sargramostim -vaccines Talk to your doctor or health care professional before taking any of these medicines: -acetaminophen -aspirin -ibuprofen -ketoprofen -naproxen This list may  not describe all possible interactions. Give your health care provider a list of all the medicines, herbs, non-prescription drugs, or dietary supplements you use. Also tell them if you smoke, drink alcohol, or use illegal drugs. Some items may interact with your medicine. What should I watch for while using this medicine? Your condition will be monitored carefully while you are receiving this medicine. You will need important blood work done while you are taking this medicine. This drug may make you feel generally unwell. This is not uncommon, as chemotherapy can affect healthy cells as well as cancer cells. Report any side effects. Continue your course of treatment even though you feel ill unless your doctor tells you to stop. In some cases, you may be given additional medicines to help with side effects. Follow all directions for their use. Call your doctor or health care professional for advice if you get a fever, chills or sore throat, or other symptoms of a cold or flu. Do not treat yourself. This drug decreases your body's ability to fight infections. Try to avoid being around people who are sick. This medicine may increase your risk to bruise or bleed. Call your doctor or health care professional if you notice any unusual bleeding. Be careful brushing and flossing your teeth or using a toothpick because you may get an infection or bleed more easily. If you have any dental work done, tell your dentist you are receiving this medicine. Avoid taking products that contain aspirin, acetaminophen, ibuprofen, naproxen, or ketoprofen unless instructed by your doctor. These medicines may hide a fever. Do not become pregnant while taking this medicine. Women should inform their doctor if they wish to become pregnant or think  they might be pregnant. There is a potential for serious side effects to an unborn child. Talk to your health care professional or pharmacist for more information. Do not breast-feed an  infant while taking this medicine. What side effects may I notice from receiving this medicine? Side effects that you should report to your doctor or health care professional as soon as possible: -allergic reactions like skin rash, itching or hives, swelling of the face, lips, or tongue -low blood counts - This drug may decrease the number of white blood cells, red blood cells and platelets. You may be at increased risk for infections and bleeding. -signs of infection - fever or chills, cough, sore throat, pain or difficulty passing urine -signs of decreased platelets or bleeding - bruising, pinpoint red spots on the skin, black, tarry stools, nosebleeds -signs of decreased red blood cells - unusually weak or tired, fainting spells, lightheadedness -breathing problems -fast or irregular heartbeat -low blood pressure -mouth sores -nausea and vomiting -pain, swelling, redness or irritation at the injection site -pain, tingling, numbness in the hands or feet -swelling of the ankle, feet, hands -weight gain Side effects that usually do not require medical attention (report to your prescriber or health care professional if they continue or are bothersome): -bone pain -complete hair loss including hair on your head, underarms, pubic hair, eyebrows, and eyelashes -diarrhea -excessive tearing -changes in the color of fingernails -loosening of the fingernails -nausea -muscle pain -red flush to skin -sweating -weak or tired This list may not describe all possible side effects. Call your doctor for medical advice about side effects. You may report side effects to FDA at 1-800-FDA-1088. Where should I keep my medicine? This drug is given in a hospital or clinic and will not be stored at home. NOTE: This sheet is a summary. It may not cover all possible information. If you have questions about this medicine, talk to your doctor, pharmacist, or health care provider.  2013, [employer].  (04/07/2008 11:52:10 AM)  Carboplatin injection What is this medicine? CARBOPLATIN (KAR boe pla tin) is a chemotherapy drug. It targets fast dividing cells, like cancer cells, and causes these cells to die. This medicine is used to treat ovarian cancer and many other cancers. This medicine may be used for other purposes; ask your health care provider or pharmacist if you have questions. What should I tell my health care provider before I take this medicine? They need to know if you have any of these conditions: -blood disorders -hearing problems -kidney disease -recent or ongoing radiation therapy -an unusual or allergic reaction to carboplatin, cisplatin, other chemotherapy, other medicines, foods, dyes, or preservatives -pregnant or trying to get pregnant -breast-feeding How should I use this medicine? This drug is usually given as an infusion into a vein. It is administered in a hospital or clinic by a specially trained health care professional. Talk to your pediatrician regarding the use of this medicine in children. Special care may be needed. Overdosage: If you think you have taken too much of this medicine contact a poison control center or emergency room at once. NOTE: This medicine is only for you. Do not share this medicine with others. What if I miss a dose? It is important not to miss a dose. Call your doctor or health care professional if you are unable to keep an appointment. What may interact with this medicine? -medicines for seizures -medicines to increase blood counts like filgrastim, pegfilgrastim, sargramostim -some antibiotics like amikacin, gentamicin, neomycin,  streptomycin, tobramycin -vaccines Talk to your doctor or health care professional before taking any of these medicines: -acetaminophen -aspirin -ibuprofen -ketoprofen -naproxen This list may not describe all possible interactions. Give your health care provider a list of all the medicines, herbs,  non-prescription drugs, or dietary supplements you use. Also tell them if you smoke, drink alcohol, or use illegal drugs. Some items may interact with your medicine. What should I watch for while using this medicine? Your condition will be monitored carefully while you are receiving this medicine. You will need important blood work done while you are taking this medicine. This drug may make you feel generally unwell. This is not uncommon, as chemotherapy can affect healthy cells as well as cancer cells. Report any side effects. Continue your course of treatment even though you feel ill unless your doctor tells you to stop. In some cases, you may be given additional medicines to help with side effects. Follow all directions for their use. Call your doctor or health care professional for advice if you get a fever, chills or sore throat, or other symptoms of a cold or flu. Do not treat yourself. This drug decreases your body's ability to fight infections. Try to avoid being around people who are sick. This medicine may increase your risk to bruise or bleed. Call your doctor or health care professional if you notice any unusual bleeding. Be careful brushing and flossing your teeth or using a toothpick because you may get an infection or bleed more easily. If you have any dental work done, tell your dentist you are receiving this medicine. Avoid taking products that contain aspirin, acetaminophen, ibuprofen, naproxen, or ketoprofen unless instructed by your doctor. These medicines may hide a fever. Do not become pregnant while taking this medicine. Women should inform their doctor if they wish to become pregnant or think they might be pregnant. There is a potential for serious side effects to an unborn child. Talk to your health care professional or pharmacist for more information. Do not breast-feed an infant while taking this medicine. What side effects may I notice from receiving this medicine? Side effects  that you should report to your doctor or health care professional as soon as possible: -allergic reactions like skin rash, itching or hives, swelling of the face, lips, or tongue -signs of infection - fever or chills, cough, sore throat, pain or difficulty passing urine -signs of decreased platelets or bleeding - bruising, pinpoint red spots on the skin, black, tarry stools, nosebleeds -signs of decreased red blood cells - unusually weak or tired, fainting spells, lightheadedness -breathing problems -changes in hearing -changes in vision -chest pain -high blood pressure -low blood counts - This drug may decrease the number of white blood cells, red blood cells and platelets. You may be at increased risk for infections and bleeding. -nausea and vomiting -pain, swelling, redness or irritation at the injection site -pain, tingling, numbness in the hands or feet -problems with balance, talking, walking -trouble passing urine or change in the amount of urine Side effects that usually do not require medical attention (report to your doctor or health care professional if they continue or are bothersome): -hair loss -loss of appetite -metallic taste in the mouth or changes in taste This list may not describe all possible side effects. Call your doctor for medical advice about side effects. You may report side effects to FDA at 1-800-FDA-1088. Where should I keep my medicine? This drug is given in a hospital or clinic and  will not be stored at home. NOTE: This sheet is a summary. It may not cover all possible information. If you have questions about this medicine, talk to your doctor, pharmacist, or health care provider.  2013, [employer]. (07/31/2007 2:38:05 PM)  Trastuzumab injection for infusion What is this medicine? TRASTUZUMAB (tras TOO zoo mab) is a monoclonal antibody. It targets a protein called HER2. This protein is found in some stomach and breast cancers. This medicine can  stop cancer cell growth. This medicine may be used with other cancer treatments. This medicine may be used for other purposes; ask your health care provider or pharmacist if you have questions. What should I tell my health care provider before I take this medicine? They need to know if you have any of these conditions: -heart disease -heart failure -infection (especially a virus infection such as chickenpox, cold sores, or herpes) -lung or breathing disease, like asthma -recent or ongoing radiation therapy -an unusual or allergic reaction to trastuzumab, benzyl alcohol, or other medications, foods, dyes, or preservatives -pregnant or trying to get pregnant -breast-feeding How should I use this medicine? This drug is given as an infusion into a vein. It is administered in a hospital or clinic by a specially trained health care professional. Talk to your pediatrician regarding the use of this medicine in children. This medicine is not approved for use in children. Overdosage: If you think you have taken too much of this medicine contact a poison control center or emergency room at once. NOTE: This medicine is only for you. Do not share this medicine with others. What if I miss a dose? It is important not to miss a dose. Call your doctor or health care professional if you are unable to keep an appointment. What may interact with this medicine? -cyclophosphamide -doxorubicin -warfarin This list may not describe all possible interactions. Give your health care provider a list of all the medicines, herbs, non-prescription drugs, or dietary supplements you use. Also tell them if you smoke, drink alcohol, or use illegal drugs. Some items may interact with your medicine. What should I watch for while using this medicine? Visit your doctor for checks on your progress. Report any side effects. Continue your course of treatment even though you feel ill unless your doctor tells you to stop. Call your  doctor or health care professional for advice if you get a fever, chills or sore throat, or other symptoms of a cold or flu. Do not treat yourself. Try to avoid being around people who are sick. You may experience fever, chills and shaking during your first infusion. These effects are usually mild and can be treated with other medicines. Report any side effects during the infusion to your health care professional. Fever and chills usually do not happen with later infusions. What side effects may I notice from receiving this medicine? Side effects that you should report to your doctor or other health care professional as soon as possible: -breathing difficulties -chest pain or palpitations -cough -dizziness or fainting -fever or chills, sore throat -skin rash, itching or hives -swelling of the legs or ankles -unusually weak or tired Side effects that usually do not require medical attention (report to your doctor or other health care professional if they continue or are bothersome): -loss of appetite -headache -muscle aches -nausea This list may not describe all possible side effects. Call your doctor for medical advice about side effects. You may report side effects to FDA at 1-800-FDA-1088. Where should I  keep my medicine? This drug is given in a hospital or clinic and will not be stored at home. NOTE: This sheet is a summary. It may not cover all possible information. If you have questions about this medicine, talk to your doctor, pharmacist, or health care provider.  2012, [employer] Standard. (02/27/2009 1:43:15 PM)  Pegfilgrastim injection What is this medicine? PEGFILGRASTIM (peg fil GRA stim) helps the body make more white blood cells. It is used to prevent infection in people with low amounts of white blood cells following cancer treatment. This medicine may be used for other purposes; ask your health care provider or pharmacist if you have questions. What should I tell my  health care provider before I take this medicine? They need to know if you have any of these conditions: -sickle cell disease -an unusual or allergic reaction to pegfilgrastim, filgrastim, E.coli protein, other medicines, foods, dyes, or preservatives -pregnant or trying to get pregnant -breast-feeding How should I use this medicine? This medicine is for injection under the skin. It is usually given by a health care professional in a hospital or clinic setting. If you get this medicine at home, you will be taught how to prepare and give this medicine. Do not shake this medicine. Use exactly as directed. Take your medicine at regular intervals. Do not take your medicine more often than directed. It is important that you put your used needles and syringes in a special sharps container. Do not put them in a trash can. If you do not have a sharps container, call your pharmacist or healthcare provider to get one. Talk to your pediatrician regarding the use of this medicine in children. While this drug may be prescribed for children who weigh more than 45 kg for selected conditions, precautions do apply Overdosage: If you think you have taken too much of this medicine contact a poison control center or emergency room at once. NOTE: This medicine is only for you. Do not share this medicine with others. What if I miss a dose? If you miss a dose, take it as soon as you can. If it is almost time for your next dose, take only that dose. Do not take double or extra doses. What may interact with this medicine? -lithium -medicines for growth therapy This list may not describe all possible interactions. Give your health care provider a list of all the medicines, herbs, non-prescription drugs, or dietary supplements you use. Also tell them if you smoke, drink alcohol, or use illegal drugs. Some items may interact with your medicine. What should I watch for while using this medicine? Visit your doctor for regular  check ups. You will need important blood work done while you are taking this medicine. What side effects may I notice from receiving this medicine? Side effects that you should report to your doctor or health care professional as soon as possible: -allergic reactions like skin rash, itching or hives, swelling of the face, lips, or tongue -breathing problems -fever -pain, redness, or swelling where injected -shoulder pain -stomach or side pain Side effects that usually do not require medical attention (report to your doctor or health care professional if they continue or are bothersome): -aches, pains -headache -loss of appetite -nausea, vomiting -unusually tired This list may not describe all possible side effects. Call your doctor for medical advice about side effects. You may report side effects to FDA at 1-800-FDA-1088. Where should I keep my medicine? Keep out of the reach of children. Store in  a refrigerator between 2 and 8 degrees C (36 and 46 degrees F). Do not freeze. Keep in carton to protect from light. Throw away this medicine if it is left out of the refrigerator for more than 48 hours. Throw away any unused medicine after the expiration date. NOTE: This sheet is a summary. It may not cover all possible information. If you have questions about this medicine, talk to your doctor, pharmacist, or health care provider.  2012, [employer] Standard. (11/26/2007 3:41:44 PM)

## 2012-05-18 NOTE — Telephone Encounter (Signed)
Per staff message and POF I have scheduled appts.  JMW  

## 2012-05-24 ENCOUNTER — Ambulatory Visit (INDEPENDENT_AMBULATORY_CARE_PROVIDER_SITE_OTHER): Payer: Medicare Other

## 2012-05-24 DIAGNOSIS — Z4889 Encounter for other specified surgical aftercare: Secondary | ICD-10-CM

## 2012-05-24 DIAGNOSIS — Z4803 Encounter for change or removal of drains: Secondary | ICD-10-CM

## 2012-05-24 NOTE — Progress Notes (Signed)
Patient came in for drain removal status post mastectomy on 05/10/12. She has has 10 ml or less of drainage for 3 days now. The stitch was removed and the drain came out without complication. Dry guaze was applied to area. She will follow up with Dr. Carolynne Edouard next week. I told her to look out for any drainage, fever or redness around area and to call us if this happens.

## 2012-05-24 NOTE — Patient Instructions (Signed)
Keep guaze over area tonight. Then you can use a band aid for the next day or two until area has closed up. Please call our office with any questions or concerns.

## 2012-05-31 ENCOUNTER — Ambulatory Visit (INDEPENDENT_AMBULATORY_CARE_PROVIDER_SITE_OTHER): Payer: Medicare Other | Admitting: General Surgery

## 2012-05-31 ENCOUNTER — Encounter (INDEPENDENT_AMBULATORY_CARE_PROVIDER_SITE_OTHER): Payer: Self-pay | Admitting: General Surgery

## 2012-05-31 VITALS — BP 122/70 | HR 68 | Resp 16 | Ht 64.0 in | Wt 133.0 lb

## 2012-05-31 DIAGNOSIS — C50419 Malignant neoplasm of upper-outer quadrant of unspecified female breast: Secondary | ICD-10-CM

## 2012-05-31 NOTE — Patient Instructions (Signed)
Ok to participate in physical therapy

## 2012-06-08 NOTE — Progress Notes (Signed)
Subjective:     Patient ID: Heather [name], female   DOB: 1945/12/20, 67 y.o.   MRN: [id number]  HPI The patient is a 67 year old white female who is 3 weeks out from a right mastectomy and axillary node dissection for a T2 N1 right breast cancer. Her last drain was removed last week. She tolerated this well. She denies any significant pain. Her appetite is good and her bowels are working normally.  Review of Systems     Objective:   Physical Exam On exam her right mastectomy incision is healing nicely with no sign of infection or seroma.    Assessment:     3 weeks status post right modified radical mastectomy for breast cancer    Plan:     At this point I think she can participate with post mastectomy physical therapy. She will need to followup with the medical and radiation oncologist. We will plan to see her back in one month to check her progress

## 2012-06-11 ENCOUNTER — Telehealth (INDEPENDENT_AMBULATORY_CARE_PROVIDER_SITE_OTHER): Payer: Self-pay | Admitting: General Surgery

## 2012-06-11 NOTE — Telephone Encounter (Signed)
RX for post mastectomy bras and prosthesis written per Dr Carolynne Edouard and faxed to [facility]. Confirmation received.

## 2012-06-14 ENCOUNTER — Other Ambulatory Visit: Payer: Self-pay | Admitting: Certified Registered Nurse Anesthetist

## 2012-06-14 ENCOUNTER — Other Ambulatory Visit (HOSPITAL_BASED_OUTPATIENT_CLINIC_OR_DEPARTMENT_OTHER): Payer: Medicare Other | Admitting: Lab

## 2012-06-14 ENCOUNTER — Encounter: Payer: Self-pay | Admitting: Adult Health

## 2012-06-14 ENCOUNTER — Ambulatory Visit (HOSPITAL_BASED_OUTPATIENT_CLINIC_OR_DEPARTMENT_OTHER): Payer: Medicare Other

## 2012-06-14 ENCOUNTER — Ambulatory Visit (HOSPITAL_BASED_OUTPATIENT_CLINIC_OR_DEPARTMENT_OTHER): Payer: Medicare Other | Admitting: Adult Health

## 2012-06-14 VITALS — BP 108/63 | HR 75 | Temp 97.4°F | Resp 18

## 2012-06-14 VITALS — BP 148/85 | HR 87 | Temp 97.9°F | Resp 20 | Ht 64.0 in | Wt 138.4 lb

## 2012-06-14 DIAGNOSIS — C773 Secondary and unspecified malignant neoplasm of axilla and upper limb lymph nodes: Secondary | ICD-10-CM

## 2012-06-14 DIAGNOSIS — C50419 Malignant neoplasm of upper-outer quadrant of unspecified female breast: Secondary | ICD-10-CM

## 2012-06-14 DIAGNOSIS — F411 Generalized anxiety disorder: Secondary | ICD-10-CM

## 2012-06-14 DIAGNOSIS — Z5112 Encounter for antineoplastic immunotherapy: Secondary | ICD-10-CM

## 2012-06-14 DIAGNOSIS — Z170 Estrogen receptor positive status [ER+]: Secondary | ICD-10-CM

## 2012-06-14 DIAGNOSIS — Z5111 Encounter for antineoplastic chemotherapy: Secondary | ICD-10-CM

## 2012-06-14 LAB — CBC WITH DIFFERENTIAL/PLATELET
BASO%: 0.1 % (ref 0.0–2.0)
Basophils Absolute: 0 10*3/uL (ref 0.0–0.1)
EOS%: 0 % (ref 0.0–7.0)
Eosinophils Absolute: 0 10*3/uL (ref 0.0–0.5)
HCT: 38.8 % (ref 34.8–46.6)
HGB: 13 g/dL (ref 11.6–15.9)
LYMPH%: 6.9 % — ABNORMAL LOW (ref 14.0–49.7)
MCH: 28.2 pg (ref 25.1–34.0)
MCHC: 33.5 g/dL (ref 31.5–36.0)
MCV: 84.2 fL (ref 79.5–101.0)
MONO#: 1.1 10*3/uL — ABNORMAL HIGH (ref 0.1–0.9)
MONO%: 8.9 % (ref 0.0–14.0)
NEUT#: 10.6 10*3/uL — ABNORMAL HIGH (ref 1.5–6.5)
NEUT%: 84.1 % — ABNORMAL HIGH (ref 38.4–76.8)
Platelets: 191 10*3/uL (ref 145–400)
RBC: 4.61 10*6/uL (ref 3.70–5.45)
RDW: 13.7 % (ref 11.2–14.5)
WBC: 12.5 10*3/uL — ABNORMAL HIGH (ref 3.9–10.3)
lymph#: 0.9 10*3/uL (ref 0.9–3.3)
nRBC: 0 % (ref 0–0)

## 2012-06-14 LAB — COMPREHENSIVE METABOLIC PANEL (CC13)
ALT: 20 U/L (ref 0–55)
AST: 20 U/L (ref 5–34)
Albumin: 4.1 g/dL (ref 3.5–5.0)
Alkaline Phosphatase: 85 U/L (ref 40–150)
BUN: 22.4 mg/dL (ref 7.0–26.0)
CO2: 25 mEq/L (ref 22–29)
Calcium: 10.2 mg/dL (ref 8.4–10.4)
Chloride: 96 mEq/L — ABNORMAL LOW (ref 98–107)
Creatinine: 0.9 mg/dL (ref 0.6–1.1)
Glucose: 111 mg/dl — ABNORMAL HIGH (ref 70–99)
Potassium: 4.2 mEq/L (ref 3.5–5.1)
Sodium: 132 mEq/L — ABNORMAL LOW (ref 136–145)
Total Bilirubin: 0.56 mg/dL (ref 0.20–1.20)
Total Protein: 7.6 g/dL (ref 6.4–8.3)

## 2012-06-14 MED ORDER — HEPARIN SOD (PORK) LOCK FLUSH 100 UNIT/ML IV SOLN
500.0000 [IU] | Freq: Once | INTRAVENOUS | Status: AC | PRN
  Administered 2012-06-14: 500 [IU]
  Filled 2012-06-14: qty 5

## 2012-06-14 MED ORDER — DEXAMETHASONE SODIUM PHOSPHATE 4 MG/ML IJ SOLN
20.0000 mg | Freq: Once | INTRAMUSCULAR | Status: AC
  Administered 2012-06-14: 20 mg via INTRAVENOUS

## 2012-06-14 MED ORDER — SODIUM CHLORIDE 0.9 % IV SOLN: Freq: Once | INTRAVENOUS | Status: AC

## 2012-06-14 MED ORDER — SODIUM CHLORIDE 0.9 % IV SOLN
Freq: Once | INTRAVENOUS | Status: AC
  Administered 2012-06-14: 12:00:00 via INTRAVENOUS

## 2012-06-14 MED ORDER — ACETAMINOPHEN 325 MG PO TABS
650.0000 mg | ORAL_TABLET | Freq: Once | ORAL | Status: AC
  Administered 2012-06-14: 650 mg via ORAL

## 2012-06-14 MED ORDER — DOCETAXEL CHEMO INJECTION 160 MG/16ML
75.0000 mg/m2 | Freq: Once | INTRAVENOUS | Status: AC
  Administered 2012-06-14: 120 mg via INTRAVENOUS
  Filled 2012-06-14: qty 12

## 2012-06-14 MED ORDER — SODIUM CHLORIDE 0.9 % IJ SOLN
10.0000 mL | INTRAMUSCULAR | Status: DC | PRN
  Administered 2012-06-14: 10 mL
  Filled 2012-06-14: qty 10

## 2012-06-14 MED ORDER — LORAZEPAM 2 MG/ML IJ SOLN
0.5000 mg | Freq: Once | INTRAMUSCULAR | Status: AC
  Administered 2012-06-14: 0.5 mg via INTRAVENOUS

## 2012-06-14 MED ORDER — SODIUM CHLORIDE 0.9 % IV SOLN
500.0000 mg | Freq: Once | INTRAVENOUS | Status: AC
  Administered 2012-06-14: 500 mg via INTRAVENOUS
  Filled 2012-06-14: qty 50

## 2012-06-14 MED ORDER — DIPHENHYDRAMINE HCL 25 MG PO CAPS
50.0000 mg | ORAL_CAPSULE | Freq: Once | ORAL | Status: AC
  Administered 2012-06-14: 25 mg via ORAL

## 2012-06-14 MED ORDER — ONDANSETRON 16 MG/50ML IVPB (CHCC)
16.0000 mg | Freq: Once | INTRAVENOUS | Status: AC
  Administered 2012-06-14: 16 mg via INTRAVENOUS

## 2012-06-14 MED ORDER — TRASTUZUMAB CHEMO INJECTION 440 MG
4.0000 mg/kg | Freq: Once | INTRAVENOUS | Status: AC
  Administered 2012-06-14: 231 mg via INTRAVENOUS
  Filled 2012-06-14: qty 11

## 2012-06-14 NOTE — Patient Instructions (Addendum)
Doing well.  Proceed with chemotherapy.  Please call us if you have any questions or concerns.    We will see you back next week.   

## 2012-06-14 NOTE — Progress Notes (Signed)
OFFICE PROGRESS NOTE  CC  Heather Bradford, MD [address] [place] [address] Dr. Chevis Pretty Dr. Chipper [name]  DIAGNOSIS: 67 year old female with invasive ductal carcinoma grade 2-3 at the 10:00 position with positive lymph node for metastatic disease.  PRIOR THERAPY:  #1 patient was originally seen in the multidisciplinary breast clinic on 03/14/2012 for new diagnosis of invasive ductal carcinoma that was HER-2 positive node-positive, ER positive PR negative with Ki-67 of 44%  #2 on MRI patient also had an anterior breast mass that was to be biopsied to decide whether or not she should receive neoadjuvant chemotherapy or adjuvant chemotherapy. Patient'[name] mass was biopsied and it was positive for a malignancy that was HER-2/neu negative.  #3 patient is now status post right mastectomy with axillary lymph node dissection with the final pathology revealing the largest tumor measuring 5 cm, 1.7 cm and 1.6 cm. Tumor was ER positive PR negative HER-2/neu positive with a Ki-67 of 44% 3 of 7 lymph nodes were positive for metastatic disease.  #4 patient will proceed with adjuvant chemotherapy and Herceptin. Chemotherapy will consist of Taxotere carboplatinum given every 21 days with weekly Herceptin. Total of 6 cycles of Taxotere carboplatin will be administered starting on 06/14/2012. Once patient completes her chemotherapy then she will go on to receive radiation therapy along with Herceptin with the Herceptin then being converted to every 3 weeks to finish a total of one year. Because patient'[name] tumor is ER positive she will also be given antiestrogen therapy adjuvantly.  CURRENT THERAPY:Cycle 1 day 1 of TCH  INTERVAL HISTORY: Heather [name] 67 y.o. female returns for evaluation for her first cycle of chemotherapy.  Her echocardiogram was completed on 11/13 and demonstrated an EF of 60-65%.  She would like a compression sleeve, but states that her insurance company informed her it requires  prior authorization when she looked into it.  She'd like [place] to work on this.  She is doing well today.  Slightly nervous about treatment.  She denies fevers, chills, nausea, vomiting, shortness of breath, or any other concerns.  A 10 point ROS is neg.   MEDICAL HISTORY: Past Medical History  Diagnosis Date  . Breast cancer   . Skin cancer   . Chronic kidney disease   . Hx: UTI (urinary tract infection)   . Right shoulder pain   . Right knee pain   . Hypertension     recently started Aldactone   . Hyperlipidemia     but not on meds;diet and exercise controlled  . Dizziness     has been going on for 62months;medical MD aware  . H/O hiatal hernia   . GERD (gastroesophageal reflux disease)     Tums prn  . Hemorrhoids   . Urinary frequency     d/t taking Aldactone  . History of UTI   . Anemia     hx of  . Cataract     immature-not sure of which eye  . Anxiety     r/t updated surgery  . Insomnia     takes Melatoniin daily  . Complication of anesthesia     pt states she is sensitive to meds    ALLERGIES:  is allergic to codeine; keflex; naprosyn; and tussin.  MEDICATIONS:  Current Outpatient Prescriptions  Medication Sig Dispense Refill  . B Complex-C (SUPER B COMPLEX PO) Take 1 each by mouth daily.      . calcium carbonate (TUMS - DOSED IN MG ELEMENTAL CALCIUM) 500  MG chewable tablet Chew 1 tablet by mouth daily.      . Cholecalciferol (VITAMIN D-3 PO) Take 4,000 Units by mouth daily.      Marland Kitchen dexamethasone (DECADRON) 4 MG tablet Take 2 tablets (8 mg total) by mouth 2 (two) times daily with a meal. Take two times a day the day before Taxotere. Then take two times a day starting the day after chemo for 3 days.  30 tablet  1  . lidocaine-prilocaine (EMLA) cream Apply topically as needed.  30 g  7  . LORazepam (ATIVAN) 0.5 MG tablet Take 1 tablet (0.5 mg total) by mouth every 6 (six) hours as needed (Nausea or vomiting).  30 tablet  0  . magnesium gluconate (MAGONATE) 500 MG tablet  Take 500 mg by mouth 2 (two) times daily.      . ondansetron (ZOFRAN) 8 MG tablet Take 1 tablet (8 mg total) by mouth 2 (two) times daily. Take two times a day starting the day after chemo for 3 days. Then take two times a day as needed for nausea or vomiting.  30 tablet  1  . PREVIDENT 5000 BOOSTER PLUS 1.1 % PSTE Take 1 application by mouth Twice daily.      . Probiotic Product (PROBIOTIC DAILY PO) Take 1 each by mouth.      . prochlorperazine (COMPAZINE) 10 MG tablet Take 1 tablet (10 mg total) by mouth every 6 (six) hours as needed (Nausea or vomiting).  30 tablet  1  . prochlorperazine (COMPAZINE) 25 MG suppository Place 1 suppository (25 mg total) rectally every 12 (twelve) hours as needed for nausea.  12 suppository  3  . spironolactone (ALDACTONE) 25 MG tablet Take 0.5 tablets (12.5 mg total) by mouth daily.  30 tablet  3  . HYDROcodone-acetaminophen (NORCO/VICODIN) 5-325 MG per tablet Take 1 tablet by mouth every 6 (six) hours as needed.        SURGICAL HISTORY:  Past Surgical History  Procedure Date  . Cyst removed from left breast 1970  . Left wrist surgery  2004    with plate  . Colonoscopy   . Esophagogastroduodenoscopy   . Mastectomy, radical 05/11/2011    right   . Mastectomy modified radical 05/10/2012    Procedure: MASTECTOMY MODIFIED RADICAL;  Surgeon: Robyne Askew, MD;  Location[facility] OR;  Service: General;  Laterality: Right;  RIGHT MODIFIED RADICAL MASTECTOMY  . Portacath placement 05/10/2012    Procedure: INSERTION PORT-A-CATH;  Surgeon: Robyne Askew, MD;  Location: [facility] OR;  Service: General;  Laterality: Left;  . Wrist fracture surgery 2004    REVIEW OF SYSTEMS:  General: fatigue (-), night sweats (-), fever (-), pain (-) Lymph: palpable nodes (-) HEENT: vision changes (-), mucositis (-), gum bleeding (-), epistaxis (-) Cardiovascular: chest pain (-), palpitations (-) Pulmonary: shortness of breath (-), dyspnea on exertion (-), cough (-), hemoptysis (-) GI:   Early satiety (-), melena (-), dysphagia (-), nausea/vomiting (-), diarrhea (-) GU: dysuria (-), hematuria (-), incontinence (-) Musculoskeletal: joint swelling (-), joint pain (-), back pain (-) Neuro: weakness (-), numbness (-), headache (-), confusion (-) Skin: Rash (-), lesions (-), dryness (-) Psych: depression (-), suicidal/homicidal ideation (-), feeling of hopelessness (-)    PHYSICAL EXAMINATION: Blood pressure 148/85, pulse 87, temperature 97.9 F (36.6 C), resp. rate 20, height 5\' 4"  (1.626 m), weight 138 lb 6.4 oz (62.778 kg). Body mass index is 23.76 kg/(m^2). General: Patient is a well appearing female in  no acute distress HEENT: PERRLA, sclerae anicteric no conjunctival pallor, MMM Neck: supple, no palpable adenopathy Lungs: clear to auscultation bilaterally, no wheezes, rhonchi, or rales Cardiovascular: regular rate rhythm, S1, S2, no murmurs, rubs or gallops Abdomen: Soft, non-tender, non-distended, normoactive bowel sounds, no HSM Extremities: warm and well perfused, no clubbing, cyanosis, or edema Skin: No rashes or lesions Neuro: Non-focal Right mastectomy site healing well.  ECOG PERFORMANCE STATUS: 0 - Asymptomatic   LABORATORY DATA: Lab Results  Component Value Date   WBC 12.5* 06/14/2012   HGB 13.0 06/14/2012   HCT 38.8 06/14/2012   MCV 84.2 06/14/2012   PLT 191 06/14/2012      Chemistry      Component Value Date/Time   NA 137 05/18/2012 1159   NA 138 05/11/2012 0640   K 4.1 05/18/2012 1159   K 4.7 05/11/2012 0640   CL 98 05/18/2012 1159   CL 99 05/11/2012 0640   CO2 30* 05/18/2012 1159   CO2 20 05/11/2012 0640   BUN 22.0 05/18/2012 1159   BUN 12 05/11/2012 0640   CREATININE 0.9 05/18/2012 1159   CREATININE 0.88 05/11/2012 0640      Component Value Date/Time   CALCIUM 9.8 05/18/2012 1159   CALCIUM 9.7 05/11/2012 0640   ALKPHOS 96 05/18/2012 1159   AST 22 05/18/2012 1159   ALT 19 05/18/2012 1159   BILITOT 0.50 05/18/2012 1159     ADDITIONAL INFORMATION: CHROMOGENIC  IN-SITU HYBRIDIZATION (1C - TUMOR WITH CLIP) Interpretation HER-2/NEU BY CISH - NO AMPLIFICATION OF HER-2 DETECTED. THE RATIO OF HER-2: CEP 17 SIGNALS WAS 1.25. Reference range: Ratio: HER2:CEP17 < 1.8 - gene amplification not observed Ratio: HER2:CEP 17 1.8-2.2 - equivocal result Ratio: HER2:CEP17 > 2.2 - gene amplification observed CHROMOGENIC IN-SITU HYBRIDIZATION (1E - TUMOR WITHOUT CLIP) Interpretation: HER2/NEU BY CISH - SHOWS AMPLIFICATION BY CISH ANALYSIS. THE RATIO OF HER2: CEP 17 SIGNALS WAS 2.83. Reference range: Ratio: HER2:CEP17 < 1.8 gene amplification not observed Ratio: HER2:CEP 17 1.8-2.2 - equivocal result Ratio: HER2:CEP17 > 2.2 - gene amplification observed Pecola Leisure MD Pathologist, Electronic Signature ( Signed 05/15/2012) FINAL DIAGNOSIS Diagnosis Breast, modified radical mastectomy , right, with axillary contents 1 of 4 FINAL for Heather [name], Heather [name] ([id number]) Diagnosis(continued) TUMOR #1 (WITH CLIP): - INVASIVE LOBULAR CARCINOMA, GRADE II, SEE COMMENT. - LYMPHOVASCULAR INVASION IDENTIFIED. - PREVIOUS BIOPSY SITE IDENTIFIED. TUMOR #2 (NO CLIP): - INVASIVE LOBULAR CARCINOMA, GRADE II, SEE COMMENT. - LOBULAR CARCINOMA IN SITU. - LYMPHOVASCULAR INVASION IDENTIFIED. - THREE LYMPH NODES, POSITIVE FOR METASTATIC MAMMARY CARCINOMA (3/7). - TUMOR DEPOSITS ARE 1.7, 1.6, AND 5 CM. - EXTRACAPSULAR TUMOR EXTENSION IDENTIFIED. - TWO LYMPH NODES, POSITIVE FOR ISOLATED TUMOR CELLS. Microscopic Comment BREAST, INVASIVE TUMOR, WITH LYMPH NODE SAMPLING Specimen, including laterality: Right breast Procedure: Modified radical mastectomy Grade: Tumor 1 and tumor 2: Grade II of III Tubule formation: 3 Nuclear pleomorphism: 2 Mitotic: 1 Tumor size (gross measurement: Tumor #1- 2.3 cm; tumor #2 - 2.4 cm Margins: Invasive, distance to closest margin: Tumor #1 2.7 cm - deep margin; tumor #2 - 1.6 cm - deep margin Lymphovascular invasion: Present Ductal carcinoma in situ:  Absent Grade: N/A Extensive intraductal component: N/A Lobular neoplasia: Both atypical hyperplasia and in situ carcinoma Extent of tumor: Skin: Grossly negative Nipple: Grossly negative Skeletal muscle: Microscopically negative Lymph nodes: # examined: 7 Lymph nodes with metastasis: 3 Isolated tumor cells (< 0.2 mm): two lymph nodes (Slide 1P and 1O) Micrometastasis: (> 0.2 mm and < 2.0 mm): none Macrometastasis: (> 2.0  mm): 1.7 cm , 1.6 cm and 5 cm Extracapsular extension: Present Breast prognostic profile: Tumor #1: Estrogen receptor: Not repeated, previous study demonstrated 98% positivity ([id number]) Progesterone receptor: Not repeated, previous study demonstrated 0% positivity ([id number]) Her 2 neu: Repeated, previous study demonstrated no amplification (1.38) ([id number]) Ki-67: Not repeated, previous study demonstrated 5% proliferation rate ([id number]) Tumor #2: Estrogen receptor: Not repeated, previous study demonstrated 97% positivity ([id number]) Progesterone receptor: Not repeated, previous study demonstrated 0% positivity ([id number]) Her 2 neu: Repeated, previous study demonstrated amplification (3.65) ([id number]) Ki-67: Not repeated, previous study demonstrated 44% proliferation rate ([id number]) 2 of 4 FINAL for Heather [name], Heather [name] ([id number]) Microscopic Comment(continued) Non-neoplastic breast: Benign fibrocystic change with usual ductal hyperplasia, sclerosing adenosis, benign radial sclerosing lesion, and microcalcifications in benign ducts and lobules. TNM: mpT2, pN1a, pMX. Comments: none  RADIOGRAPHIC STUDIES:  Mr Lodema Pilot Contrast  03/23/2012  *RADIOLOGY REPORT*  Clinical Data: New diagnosis breast cancer.  Rule out metastatic disease.  MRI HEAD WITHOUT AND WITH CONTRAST  Technique:  Multiplanar, multiecho pulse sequences of the brain and surrounding structures were obtained according to standard protocol without and with intravenous contrast   Contrast: 12mL MULTIHANCE GADOBENATE DIMEGLUMINE 529 MG/ML IV SOLN  Comparison: None.  Findings: Ventricle size is normal.  Small 4 mm hyperintensities in the cerebral white matter bilaterally do not enhance and are most likely due to chronic microvascular ischemia.  Negative for acute infarct.  Diffusion weighted imaging is normal.  Pituitary is normal in size.  Negative for intracranial hemorrhage.  Following contrast infusion, no enhancing lesions are identified.  Paranasal sinuses are clear.  Vessels at the base of the brain are patent.  IMPRESSION: Negative for metastatic disease.  No acute abnormality.   Original Report Authenticated By: Janeece Riggers, M.D.    Mr Breast Bilateral W Wo Contrast  03/12/2012  *RADIOLOGY REPORT*  Clinical Data: Recently diagnosed invasive mammary carcinoma in the 10 o'clock region of the right breast with a metastatic right axillary lymph node.  BILATERAL BREAST MRI WITH AND WITHOUT CONTRAST  Technique: Multiplanar, multisequence MR images of both breasts were obtained prior to and following the intravenous administration of 12ml of multihance.  Three dimensional images were evaluated at the independent DynaCad workstation.  Comparison:  Mammograms dated 03/05/2012, 03/01/2012, 03/01/2011 and 02/26/2010 from [facility].  Findings: There is a moderate background parenchymal enhancement pattern.  Right breast: 1.  There is a lobulated 2.2 x 1.7 x 1.8 cm mass in the posterior third of the upper outer quadrant of the right breast.  There is a post biopsy clip artifact 1 cm lateral to the mass. 2.  In the anterior third upper outer quadrant of the right breast there is patchy non mass like enhancement measuring 0.8 x 0.3 x 0.3 cm.  Left breast:  There is no abnormal enhancement the left breast.  There are enlarged right axillary lymph nodes.  The largest right axillary lymph node measures 2.4 cm and is located lateral to the pectoralis minor consistent with level I  adenopathy.  There is a 1.6 cm lymph node behind the pectoralis minor consistent with enlarged level II adenopathy.  IMPRESSION:  1.  2.2 cm mass in the posterior third of the upper outer quadrant of the right breast corresponding with the known invasive mammary carcinoma. 2.  Patchy non mass-like enhancement in the anterior third of the upper outer quadrant of the right breast.  MR guided core biopsy is recommended. 3. Enlarged level I and II right  axillary adenopathy.  RECOMMENDATION: MR guided core biopsy of the right breast.  THREE-DIMENSIONAL MR IMAGE RENDERING ON INDEPENDENT WORKSTATION:  Three-dimensional MR images were rendered by post-processing of the original MR data on an independent workstation.  The three- dimensional MR images were interpreted, and findings were reported in the accompanying complete MRI report for this study.  BI-RADS CATEGORY 4:  Suspicious abnormality - biopsy should be considered.   Original Report Authenticated By: Baird Lyons, M.D.    Mr Biopsy/wire Localization  03/20/2012  *RADIOLOGY REPORT*  Clinical Data:  Known right breast carcinoma.  Additional area of worrisome enhancement located within the anterior upper outer quadrant of the right breast.  MRI GUIDED VACUUM ASSISTED BIOPSY OF THE RIGHT BREAST WITHOUT AND WITH CONTRAST  Comparison: Previous exams.  Technique: Multiplanar, multisequence MR images of the right breast were obtained prior to and following the intravenous administration of 12 ml of Mulithance.  I met with the patient, and we discussed the procedure of MRI guided biopsy, including risks, benefits, and alternatives. Specifically, we discussed the risks of infection, bleeding, tissue injury, clip migration, and inadequate sampling.  Informed, written consent was given.  Using sterile technique, 2% Lidocaine, MRI guidance, and a 9 gauge vacuum assisted device, biopsy was performed of the area of enhancement located within the anterior portion of the  upper-outer quadrant - right breast using a lateral approach.  At the conclusion of the procedure, a  tissue marker clip was deployed into the biopsy cavity.  IMPRESSION: MRI guided biopsy of the area of enhancement located within the anterior portion of the upper-outer quadrant of the right breast as discussed above. No apparent complications.  BI-RADS CATEGORY 6:  Known biopsy-proven malignancy - appropriate action should be taken.  THREE-DIMENSIONAL MR IMAGE RENDERING ON INDEPENDENT WORKSTATION:  Three-dimensional MR images were rendered by post-processing of the original MR data on an independent workstation.  The three- dimensional MR images were interpreted, and findings were reported in the accompanying complete MRI report for this study.  The pathology associated with the right breast MR guided core biopsy demonstrated invasive mammary carcinoma and in situ mammary carcinoma.  This is concordant with imaging findings.  I have discussed findings with the patient by telephone and answered her questions.  The patient states that biopsy site is clean and dry without hematoma formation or signs of infection.  Post biopsy wound care instructions were reviewed with the patient.  Follow-up will be with Dr. Carolynne Edouard and Dr. Welton Flakes regards her treatment plan.  The patient was encouraged to call the Breast Center for additional questions or concerns   Original Report Authenticated By: Rolla Plate, M.D.    Nm Pet Image Initial (pi) Skull Base To Thigh  03/22/2012  *RADIOLOGY REPORT*  Clinical Data: Initial treatment strategy for high risk right breast cancer.  NUCLEAR MEDICINE PET SKULL BASE TO THIGH  Fasting Blood Glucose:  100  Technique:  18.6 mCi F-18 FDG was injected intravenously. CT data was obtained and used for attenuation correction and anatomic localization only.  (This was not acquired as a diagnostic CT examination.) Additional exam technical data entered on technologist worksheet.  Comparison:  MRI  breast dated 03/12/2012  Findings:  Neck: No hypermetabolic lymph nodes in the neck.  Chest:  Vague hypermetabolism in the upper/central right breast, max SUV 2.7.  Additional focal hypermetabolism in the upper/outer right breast, max SUV 2.3 (PET image 105).  These findings correspond to biopsy-proven primary breast carcinoma.  1.3 cm short axis right  subpectoral lymph node (series 2/image 66), max SUV 3.1.  Right axillary lymphadenopathy measuring up to 1.6 cm short axis (series 2/image 88), max SUV 4.8).  No hypermetabolic mediastinal or hilar nodes.  No suspicious pulmonary nodules on the CT scan.  Abdomen/Pelvis:  No abnormal hypermetabolic activity within the liver, pancreas, adrenal glands, or spleen.  No hypermetabolic lymph nodes in the abdomen or pelvis.  Skeleton:  No focal hypermetabolic activity to suggest skeletal metastasis.  IMPRESSION: Two foci of hypermetabolism in the upper right breast, max SUV 2.7, corresponding to biopsy-proven primary breast carcinoma.  Associated right subpectoral and axillary nodal metastases, max SUV 4.8.  No evidence of distant metastases.   Original Report Authenticated By: Charline Bills, M.D.    Mm Digital Diagnostic Unilat R  03/20/2012  *RADIOLOGY REPORT*  Clinical Data:  Post right breast MR guided core biopsy.  DIGITAL DIAGNOSTIC RIGHT BREAST MAMMOGRAM  Comparison:  03/05/2012, 03/01/2012, 03/01/2011, 02/26/2010 mammograms from [facility] imaging.  Findings:  Films are performed following MR guided biopsy of the anterior portion of the upper-outer quadrant of the right breast. The hourglass shaped clip placed during the MR guided core biopsy appears in appropriate position.  IMPRESSION: Appropriate positioning of clip following right breast MR guided core biopsy.   Original Report Authenticated By: Rolla Plate, M.D.     ASSESSMENT: 68 year old female with  #1 new diagnosis of invasive lobular carcinoma that is ER positive and HER-2/neu positive.Patient is  now status post mastectomy with right axillary lymph node dissection.  tumor measured 1.7 cm with the next one measuring 1.6 5 cm. 3 of 7 lymph nodes were positive for metastatic disease With extracapsular extension. Tumor was ER positive HER-2/neu positive  #2 patient is now seen for start of systemic chemotherapy consisting of Taxotere carboplatinum and Herceptin given every 21 days for total of 6 cycles of Taxotere carboplatinum and weekly Herceptin beginning on 06/14/12.  #3 patient will need postmastectomy radiation therapy and I will refer the patient back once her chemotherapy is completed.  PLAN:  #1 Ms. Arceneaux is doing well.  She will proceed with chemotherapy today.  We had a very extensive discussion about possible side effects, skin care, mouth care, nausea/vomiting management, constipation management, and keeping hydrated.  I will talk to Lanora Manis who helps with are pre-authorization needs to assist in getting her a compression sleeve.   #2 I will see her back next week for labs and weekly Herceptin.   All questions were answered. The patient knows to call the clinic with any problems, questions or concerns. We can certainly see the patient much sooner if necessary.  I spent 40 minutes counseling the patient face to face. The total time spent in the appointment was 30 minutes.   Cherie Ouch Lyn Hollingshead, NP Medical Oncology [facility] Phone: [phone] 06/14/2012, 11:07 AM

## 2012-06-14 NOTE — Progress Notes (Signed)
Ok to decrease patients benadryl dose to 25 mg from the ordered 50 mg po Benadryl per Augustin Schooling, NP due to patient being drowsy from the administered 0.5 mg Ativan. Plan discussed with patient and patient is agreeable.   Patient verbalized understanding not to drive as she received Ativan. Patient's husband is driving her home.

## 2012-06-14 NOTE — Patient Instructions (Addendum)
[facility] Discharge Instructions for Patients Receiving Chemotherapy  Today you received the following chemotherapy agents Herceptin/Carboplatin/Taxotere To help prevent nausea and vomiting after your treatment, we encourage you to take your nausea medication as prescribed. If you develop nausea and vomiting that is not controlled by your nausea medication, call the clinic. If it is after clinic hours your family physician or the after hours number for the clinic or go to the Emergency Department.   BELOW ARE SYMPTOMS THAT SHOULD BE REPORTED IMMEDIATELY:  *FEVER GREATER THAN 100.5 F  *CHILLS WITH OR WITHOUT FEVER  NAUSEA AND VOMITING THAT IS NOT CONTROLLED WITH YOUR NAUSEA MEDICATION  *UNUSUAL SHORTNESS OF BREATH  *UNUSUAL BRUISING OR BLEEDING  TENDERNESS IN MOUTH AND THROAT WITH OR WITHOUT PRESENCE OF ULCERS  *URINARY PROBLEMS  *BOWEL PROBLEMS  UNUSUAL RASH Items with * indicate a potential emergency and should be followed up as soon as possible.  One of the nurses will contact you 24 hours after your treatment. Please let the nurse know about any problems that you may have experienced. Feel free to call the clinic you have any questions or concerns. The clinic phone number is [phone].   I have been informed and understand all the instructions given to me. I know to contact the clinic, my physician, or go to the Emergency Department if any problems should occur. I do not have any questions at this time, but understand that I may call the clinic during office hours   should I have any questions or need assistance in obtaining follow up care.    __________________________________________  _____________  __________ Signature of Patient or Authorized Representative            Date                   Time    __________________________________________ Nurse's Signature     Docetaxel injection What is this medicine? DOCETAXEL (doe se TAX el) is a  chemotherapy drug. It targets fast dividing cells, like cancer cells, and causes these cells to die. This medicine is used to treat many types of cancers like breast cancer, certain stomach cancers, head and neck cancer, lung cancer, and prostate cancer. This medicine may be used for other purposes; ask your health care provider or pharmacist if you have questions. What should I tell my health care provider before I take this medicine? They need to know if you have any of these conditions: -infection (especially a virus infection such as chickenpox, cold sores, or herpes) -liver disease -low blood counts, like low white cell, platelet, or red cell counts -an unusual or allergic reaction to docetaxel, polysorbate 80, other chemotherapy agents, other medicines, foods, dyes, or preservatives -pregnant or trying to get pregnant -breast-feeding How should I use this medicine? This drug is given as an infusion into a vein. It is administered in a hospital or clinic by a specially trained health care professional. Talk to your pediatrician regarding the use of this medicine in children. Special care may be needed. Overdosage: If you think you have taken too much of this medicine contact a poison control center or emergency room at once. NOTE: This medicine is only for you. Do not share this medicine with others. What if I miss a dose? It is important not to miss your dose. Call your doctor or health care professional if you are unable to keep an appointment. What may interact with this medicine? -cyclosporine -erythromycin -ketoconazole -medicines to  increase blood counts like filgrastim, pegfilgrastim, sargramostim -vaccines Talk to your doctor or health care professional before taking any of these medicines: -acetaminophen -aspirin -ibuprofen -ketoprofen -naproxen This list may not describe all possible interactions. Give your health care provider a list of all the medicines, herbs,  non-prescription drugs, or dietary supplements you use. Also tell them if you smoke, drink alcohol, or use illegal drugs. Some items may interact with your medicine. What should I watch for while using this medicine? Your condition will be monitored carefully while you are receiving this medicine. You will need important blood work done while you are taking this medicine. This drug may make you feel generally unwell. This is not uncommon, as chemotherapy can affect healthy cells as well as cancer cells. Report any side effects. Continue your course of treatment even though you feel ill unless your doctor tells you to stop. In some cases, you may be given additional medicines to help with side effects. Follow all directions for their use. Call your doctor or health care professional for advice if you get a fever, chills or sore throat, or other symptoms of a cold or flu. Do not treat yourself. This drug decreases your body's ability to fight infections. Try to avoid being around people who are sick. This medicine may increase your risk to bruise or bleed. Call your doctor or health care professional if you notice any unusual bleeding. Be careful brushing and flossing your teeth or using a toothpick because you may get an infection or bleed more easily. If you have any dental work done, tell your dentist you are receiving this medicine. Avoid taking products that contain aspirin, acetaminophen, ibuprofen, naproxen, or ketoprofen unless instructed by your doctor. These medicines may hide a fever. Do not become pregnant while taking this medicine. Women should inform their doctor if they wish to become pregnant or think they might be pregnant. There is a potential for serious side effects to an unborn child. Talk to your health care professional or pharmacist for more information. Do not breast-feed an infant while taking this medicine. What side effects may I notice from receiving this medicine? Side effects  that you should report to your doctor or health care professional as soon as possible: -allergic reactions like skin rash, itching or hives, swelling of the face, lips, or tongue -low blood counts - This drug may decrease the number of white blood cells, red blood cells and platelets. You may be at increased risk for infections and bleeding. -signs of infection - fever or chills, cough, sore throat, pain or difficulty passing urine -signs of decreased platelets or bleeding - bruising, pinpoint red spots on the skin, black, tarry stools, nosebleeds -signs of decreased red blood cells - unusually weak or tired, fainting spells, lightheadedness -breathing problems -fast or irregular heartbeat -low blood pressure -mouth sores -nausea and vomiting -pain, swelling, redness or irritation at the injection site -pain, tingling, numbness in the hands or feet -swelling of the ankle, feet, hands -weight gain Side effects that usually do not require medical attention (report to your prescriber or health care professional if they continue or are bothersome): -bone pain -complete hair loss including hair on your head, underarms, pubic hair, eyebrows, and eyelashes -diarrhea -excessive tearing -changes in the color of fingernails -loosening of the fingernails -nausea -muscle pain -red flush to skin -sweating -weak or tired This list may not describe all possible side effects. Call your doctor for medical advice about side effects. You may report  side effects to FDA at 1-800-FDA-1088. Where should I keep my medicine? This drug is given in a hospital or clinic and will not be stored at home. NOTE: This sheet is a summary. It may not cover all possible information. If you have questions about this medicine, talk to your doctor, pharmacist, or health care provider.  2013, [employer]. (04/07/2008 11:52:10 AM)    Carboplatin injection What is this medicine? CARBOPLATIN (KAR boe pla tin)  is a chemotherapy drug. It targets fast dividing cells, like cancer cells, and causes these cells to die. This medicine is used to treat ovarian cancer and many other cancers. This medicine may be used for other purposes; ask your health care provider or pharmacist if you have questions. What should I tell my health care provider before I take this medicine? They need to know if you have any of these conditions: -blood disorders -hearing problems -kidney disease -recent or ongoing radiation therapy -an unusual or allergic reaction to carboplatin, cisplatin, other chemotherapy, other medicines, foods, dyes, or preservatives -pregnant or trying to get pregnant -breast-feeding How should I use this medicine? This drug is usually given as an infusion into a vein. It is administered in a hospital or clinic by a specially trained health care professional. Talk to your pediatrician regarding the use of this medicine in children. Special care may be needed. Overdosage: If you think you have taken too much of this medicine contact a poison control center or emergency room at once. NOTE: This medicine is only for you. Do not share this medicine with others. What if I miss a dose? It is important not to miss a dose. Call your doctor or health care professional if you are unable to keep an appointment. What may interact with this medicine? -medicines for seizures -medicines to increase blood counts like filgrastim, pegfilgrastim, sargramostim -some antibiotics like amikacin, gentamicin, neomycin, streptomycin, tobramycin -vaccines Talk to your doctor or health care professional before taking any of these medicines: -acetaminophen -aspirin -ibuprofen -ketoprofen -naproxen This list may not describe all possible interactions. Give your health care provider a list of all the medicines, herbs, non-prescription drugs, or dietary supplements you use. Also tell them if you smoke, drink alcohol, or use  illegal drugs. Some items may interact with your medicine. What should I watch for while using this medicine? Your condition will be monitored carefully while you are receiving this medicine. You will need important blood work done while you are taking this medicine. This drug may make you feel generally unwell. This is not uncommon, as chemotherapy can affect healthy cells as well as cancer cells. Report any side effects. Continue your course of treatment even though you feel ill unless your doctor tells you to stop. In some cases, you may be given additional medicines to help with side effects. Follow all directions for their use. Call your doctor or health care professional for advice if you get a fever, chills or sore throat, or other symptoms of a cold or flu. Do not treat yourself. This drug decreases your body's ability to fight infections. Try to avoid being around people who are sick. This medicine may increase your risk to bruise or bleed. Call your doctor or health care professional if you notice any unusual bleeding. Be careful brushing and flossing your teeth or using a toothpick because you may get an infection or bleed more easily. If you have any dental work done, tell your dentist you are receiving this medicine. Avoid taking  products that contain aspirin, acetaminophen, ibuprofen, naproxen, or ketoprofen unless instructed by your doctor. These medicines may hide a fever. Do not become pregnant while taking this medicine. Women should inform their doctor if they wish to become pregnant or think they might be pregnant. There is a potential for serious side effects to an unborn child. Talk to your health care professional or pharmacist for more information. Do not breast-feed an infant while taking this medicine. What side effects may I notice from receiving this medicine? Side effects that you should report to your doctor or health care professional as soon as possible: -allergic  reactions like skin rash, itching or hives, swelling of the face, lips, or tongue -signs of infection - fever or chills, cough, sore throat, pain or difficulty passing urine -signs of decreased platelets or bleeding - bruising, pinpoint red spots on the skin, black, tarry stools, nosebleeds -signs of decreased red blood cells - unusually weak or tired, fainting spells, lightheadedness -breathing problems -changes in hearing -changes in vision -chest pain -high blood pressure -low blood counts - This drug may decrease the number of white blood cells, red blood cells and platelets. You may be at increased risk for infections and bleeding. -nausea and vomiting -pain, swelling, redness or irritation at the injection site -pain, tingling, numbness in the hands or feet -problems with balance, talking, walking -trouble passing urine or change in the amount of urine Side effects that usually do not require medical attention (report to your doctor or health care professional if they continue or are bothersome): -hair loss -loss of appetite -metallic taste in the mouth or changes in taste This list may not describe all possible side effects. Call your doctor for medical advice about side effects. You may report side effects to FDA at 1-800-FDA-1088. Where should I keep my medicine? This drug is given in a hospital or clinic and will not be stored at home. NOTE: This sheet is a summary. It may not cover all possible information. If you have questions about this medicine, talk to your doctor, pharmacist, or health care provider.  2013, [employer]. (07/31/2007 2:38:05 PM)     Trastuzumab injection for infusion What is this medicine? TRASTUZUMAB (tras TOO zoo mab) is a monoclonal antibody. It targets a protein called HER2. This protein is found in some stomach and breast cancers. This medicine can stop cancer cell growth. This medicine may be used with other cancer treatments. This  medicine may be used for other purposes; ask your health care provider or pharmacist if you have questions. What should I tell my health care provider before I take this medicine? They need to know if you have any of these conditions: -heart disease -heart failure -infection (especially a virus infection such as chickenpox, cold sores, or herpes) -lung or breathing disease, like asthma -recent or ongoing radiation therapy -an unusual or allergic reaction to trastuzumab, benzyl alcohol, or other medications, foods, dyes, or preservatives -pregnant or trying to get pregnant -breast-feeding How should I use this medicine? This drug is given as an infusion into a vein. It is administered in a hospital or clinic by a specially trained health care professional. Talk to your pediatrician regarding the use of this medicine in children. This medicine is not approved for use in children. Overdosage: If you think you have taken too much of this medicine contact a poison control center or emergency room at once. NOTE: This medicine is only for you. Do not share this medicine with  others. What if I miss a dose? It is important not to miss a dose. Call your doctor or health care professional if you are unable to keep an appointment. What may interact with this medicine? -cyclophosphamide -doxorubicin -warfarin This list may not describe all possible interactions. Give your health care provider a list of all the medicines, herbs, non-prescription drugs, or dietary supplements you use. Also tell them if you smoke, drink alcohol, or use illegal drugs. Some items may interact with your medicine. What should I watch for while using this medicine? Visit your doctor for checks on your progress. Report any side effects. Continue your course of treatment even though you feel ill unless your doctor tells you to stop. Call your doctor or health care professional for advice if you get a fever, chills or sore throat,  or other symptoms of a cold or flu. Do not treat yourself. Try to avoid being around people who are sick. You may experience fever, chills and shaking during your first infusion. These effects are usually mild and can be treated with other medicines. Report any side effects during the infusion to your health care professional. Fever and chills usually do not happen with later infusions. What side effects may I notice from receiving this medicine? Side effects that you should report to your doctor or other health care professional as soon as possible: -breathing difficulties -chest pain or palpitations -cough -dizziness or fainting -fever or chills, sore throat -skin rash, itching or hives -swelling of the legs or ankles -unusually weak or tired Side effects that usually do not require medical attention (report to your doctor or other health care professional if they continue or are bothersome): -loss of appetite -headache -muscle aches -nausea This list may not describe all possible side effects. Call your doctor for medical advice about side effects. You may report side effects to FDA at 1-800-FDA-1088. Where should I keep my medicine? This drug is given in a hospital or clinic and will not be stored at home. NOTE: This sheet is a summary. It may not cover all possible information. If you have questions about this medicine, talk to your doctor, pharmacist, or health care provider.  2013, [employer]. (02/27/2009 1:43:15 PM)

## 2012-06-15 ENCOUNTER — Ambulatory Visit (HOSPITAL_BASED_OUTPATIENT_CLINIC_OR_DEPARTMENT_OTHER): Payer: Medicare Other

## 2012-06-15 ENCOUNTER — Telehealth: Payer: Self-pay | Admitting: *Deleted

## 2012-06-15 VITALS — BP 144/77 | HR 69 | Temp 97.2°F

## 2012-06-15 DIAGNOSIS — C50419 Malignant neoplasm of upper-outer quadrant of unspecified female breast: Secondary | ICD-10-CM

## 2012-06-15 MED ORDER — PEGFILGRASTIM INJECTION 6 MG/0.6ML
6.0000 mg | Freq: Once | SUBCUTANEOUS | Status: AC
  Administered 2012-06-15: 6 mg via SUBCUTANEOUS
  Filled 2012-06-15: qty 0.6

## 2012-06-15 NOTE — Telephone Encounter (Signed)
Mrs Gassert her for Neulasta injection following 1st tc chemotherapy treatment.  States doing well, no nausea, vomiting, or diarrhea.  Drinking fluids and eating well.  All questions answered.  Knows to call the clinic if she has any problems or concerns.

## 2012-06-21 ENCOUNTER — Ambulatory Visit (HOSPITAL_BASED_OUTPATIENT_CLINIC_OR_DEPARTMENT_OTHER): Payer: Medicare Other | Admitting: Adult Health

## 2012-06-21 ENCOUNTER — Other Ambulatory Visit: Payer: Medicare Other | Admitting: Lab

## 2012-06-21 ENCOUNTER — Other Ambulatory Visit (HOSPITAL_BASED_OUTPATIENT_CLINIC_OR_DEPARTMENT_OTHER): Payer: Medicare Other | Admitting: Lab

## 2012-06-21 ENCOUNTER — Ambulatory Visit (HOSPITAL_BASED_OUTPATIENT_CLINIC_OR_DEPARTMENT_OTHER): Payer: Medicare Other

## 2012-06-21 VITALS — BP 145/80 | HR 105 | Temp 97.7°F | Resp 18 | Ht 64.0 in | Wt 134.4 lb

## 2012-06-21 DIAGNOSIS — C50919 Malignant neoplasm of unspecified site of unspecified female breast: Secondary | ICD-10-CM

## 2012-06-21 DIAGNOSIS — C50419 Malignant neoplasm of upper-outer quadrant of unspecified female breast: Secondary | ICD-10-CM

## 2012-06-21 DIAGNOSIS — K5900 Constipation, unspecified: Secondary | ICD-10-CM

## 2012-06-21 DIAGNOSIS — K219 Gastro-esophageal reflux disease without esophagitis: Secondary | ICD-10-CM

## 2012-06-21 DIAGNOSIS — Z5112 Encounter for antineoplastic immunotherapy: Secondary | ICD-10-CM

## 2012-06-21 DIAGNOSIS — Z170 Estrogen receptor positive status [ER+]: Secondary | ICD-10-CM

## 2012-06-21 LAB — CBC WITH DIFFERENTIAL/PLATELET
BASO%: 0.6 % (ref 0.0–2.0)
Basophils Absolute: 0 10*3/uL (ref 0.0–0.1)
EOS%: 0.6 % (ref 0.0–7.0)
Eosinophils Absolute: 0 10*3/uL (ref 0.0–0.5)
HCT: 40.1 % (ref 34.8–46.6)
HGB: 13.6 g/dL (ref 11.6–15.9)
LYMPH%: 50.6 % — ABNORMAL HIGH (ref 14.0–49.7)
MCH: 28.7 pg (ref 25.1–34.0)
MCHC: 33.9 g/dL (ref 31.5–36.0)
MCV: 84.6 fL (ref 79.5–101.0)
MONO#: 0.5 10*3/uL (ref 0.1–0.9)
MONO%: 27.1 % — ABNORMAL HIGH (ref 0.0–14.0)
NEUT#: 0.4 10*3/uL — CL (ref 1.5–6.5)
NEUT%: 21.1 % — ABNORMAL LOW (ref 38.4–76.8)
Platelets: 115 10*3/uL — ABNORMAL LOW (ref 145–400)
RBC: 4.74 10*6/uL (ref 3.70–5.45)
RDW: 13.1 % (ref 11.2–14.5)
WBC: 1.7 10*3/uL — ABNORMAL LOW (ref 3.9–10.3)
lymph#: 0.8 10*3/uL — ABNORMAL LOW (ref 0.9–3.3)
nRBC: 0 % (ref 0–0)

## 2012-06-21 LAB — COMPREHENSIVE METABOLIC PANEL (CC13)
ALT: 133 U/L — ABNORMAL HIGH (ref 0–55)
AST: 49 U/L — ABNORMAL HIGH (ref 5–34)
Albumin: 3.4 g/dL — ABNORMAL LOW (ref 3.5–5.0)
Alkaline Phosphatase: 88 U/L (ref 40–150)
BUN: 19.1 mg/dL (ref 7.0–26.0)
CO2: 28 mEq/L (ref 22–29)
Calcium: 8.9 mg/dL (ref 8.4–10.4)
Chloride: 98 mEq/L (ref 98–107)
Creatinine: 0.8 mg/dL (ref 0.6–1.1)
Glucose: 91 mg/dl (ref 70–99)
Potassium: 3.8 mEq/L (ref 3.5–5.1)
Sodium: 135 mEq/L — ABNORMAL LOW (ref 136–145)
Total Bilirubin: 0.89 mg/dL (ref 0.20–1.20)
Total Protein: 6.8 g/dL (ref 6.4–8.3)

## 2012-06-21 MED ORDER — PREDNISONE 5 MG PO TABS: 5.0000 mg | ORAL_TABLET | Freq: Every day | ORAL | Status: DC

## 2012-06-21 MED ORDER — SODIUM CHLORIDE 0.9 % IV SOLN
Freq: Once | INTRAVENOUS | Status: AC
  Administered 2012-06-21: 11:00:00 via INTRAVENOUS

## 2012-06-21 MED ORDER — OMEPRAZOLE 20 MG PO CPDR: 20.0000 mg | DELAYED_RELEASE_CAPSULE | Freq: Every day | ORAL | Status: DC

## 2012-06-21 MED ORDER — DIPHENHYDRAMINE HCL 25 MG PO CAPS
50.0000 mg | ORAL_CAPSULE | Freq: Once | ORAL | Status: AC
  Administered 2012-06-21: 50 mg via ORAL

## 2012-06-21 MED ORDER — HEPARIN SOD (PORK) LOCK FLUSH 100 UNIT/ML IV SOLN
500.0000 [IU] | Freq: Once | INTRAVENOUS | Status: AC | PRN
  Administered 2012-06-21: 500 [IU]
  Filled 2012-06-21: qty 5

## 2012-06-21 MED ORDER — TRASTUZUMAB CHEMO INJECTION 440 MG
2.0000 mg/kg | Freq: Once | INTRAVENOUS | Status: AC
  Administered 2012-06-21: 126 mg via INTRAVENOUS
  Filled 2012-06-21: qty 6

## 2012-06-21 MED ORDER — SODIUM CHLORIDE 0.9 % IJ SOLN
10.0000 mL | INTRAMUSCULAR | Status: DC | PRN
  Administered 2012-06-21: 10 mL
  Filled 2012-06-21: qty 10

## 2012-06-21 MED ORDER — ACETAMINOPHEN 325 MG PO TABS
650.0000 mg | ORAL_TABLET | Freq: Once | ORAL | Status: AC
  Administered 2012-06-21: 650 mg via ORAL

## 2012-06-21 NOTE — Progress Notes (Signed)
OFFICE PROGRESS NOTE  CC  Heather Bradford, MD [address] [place] [address] Dr. Chevis Pretty Dr. Chipper Herb  DIAGNOSIS: 67 year old female with invasive lobular breast cancer, stage IIB, receiving adjuvant chemotherapy  PRIOR THERAPY:  #1 patient was originally seen in the multidisciplinary breast clinic on 03/14/2012 for new diagnosis of invasive ductal carcinoma that was HER-2 positive node-positive, ER positive PR negative with Ki-67 of 44%  #2 on MRI patient also had an anterior breast mass that was to be biopsied to decide whether or not she should receive neoadjuvant chemotherapy or adjuvant chemotherapy. Patient's mass was biopsied and it was positive for a malignancy that was HER-2/neu negative.  #3 patient is now status post right mastectomy with axillary lymph node dissection with the final pathology revealing the largest tumor measuring 5 cm, 1.7 cm and 1.6 cm. Tumor was ER positive PR negative HER-2/neu positive with a Ki-67 of 44% 3 of 7 lymph nodes were positive for metastatic disease.  #4 patient will proceed with adjuvant chemotherapy and Herceptin. Chemotherapy will consist of Taxotere carboplatinum given every 21 days with weekly Herceptin. Total of 6 cycles of Taxotere carboplatin will be administered starting on 06/14/2012. Once patient completes her chemotherapy then she will go on to receive radiation therapy along with Herceptin with the Herceptin then being converted to every 3 weeks to finish a total of one year. Because patient's tumor is ER positive she will also be given antiestrogen therapy adjuvantly.  CURRENT THERAPY:Cycle 1 day 8 of TCH  INTERVAL HISTORY: Returns today for followup of her breast cancer. She initially did well with her chemotherapy, but the dexamethasone "red her out" and she couldn't sleep for several days. She had Ativan available but did not use it. When she came off the dexamethasone she "slumped", stopped eating, felt terrible  all over, and very weak. Aside from these issues, she has been significantly constipated. She took a Dulcolax suppositories with minimal results. She is also now having more at GERD problems. She's got taste perversion and loss of appetite, but no significant problems with nausea or vomiting. She has some blurred vision. Her teeth and tongue felt "numb". She got back pain from the Neulasta. There have been no fevers, bleeding, or rash, and her port is working well. A detailed review of systems was otherwise noncontributory  MEDICAL HISTORY: Past Medical History  Diagnosis Date  . Breast cancer   . Skin cancer   . Chronic kidney disease   . Hx: UTI (urinary tract infection)   . Right shoulder pain   . Right knee pain   . Hypertension     recently started Aldactone   . Hyperlipidemia     but not on meds;diet and exercise controlled  . Dizziness     has been going on for 49months;medical MD aware  . H/O hiatal hernia   . GERD (gastroesophageal reflux disease)     Tums prn  . Hemorrhoids   . Urinary frequency     d/t taking Aldactone  . History of UTI   . Anemia     hx of  . Cataract     immature-not sure of which eye  . Anxiety     r/t updated surgery  . Insomnia     takes Melatoniin daily  . Complication of anesthesia     pt states she is sensitive to meds    ALLERGIES:  is allergic to codeine; keflex; naprosyn; and tussin.  MEDICATIONS:  Current Outpatient  Prescriptions  Medication Sig Dispense Refill  . B Complex-C (SUPER B COMPLEX PO) Take 1 each by mouth daily.      . calcium carbonate (TUMS - DOSED IN MG ELEMENTAL CALCIUM) 500 MG chewable tablet Chew 1 tablet by mouth daily.      . Cholecalciferol (VITAMIN D-3 PO) Take 4,000 Units by mouth daily.      Marland Kitchen dexamethasone (DECADRON) 4 MG tablet Take 2 tablets (8 mg total) by mouth 2 (two) times daily with a meal. Take two times a day the day before Taxotere. Then take two times a day starting the day after chemo for 3 days.   30 tablet  1  . HYDROcodone-acetaminophen (NORCO/VICODIN) 5-325 MG per tablet Take 1 tablet by mouth every 6 (six) hours as needed.      . lidocaine-prilocaine (EMLA) cream Apply topically as needed.  30 g  7  . LORazepam (ATIVAN) 0.5 MG tablet Take 1 tablet (0.5 mg total) by mouth every 6 (six) hours as needed (Nausea or vomiting).  30 tablet  0  . magnesium gluconate (MAGONATE) 500 MG tablet Take 500 mg by mouth 2 (two) times daily.      Marland Kitchen omeprazole (PRILOSEC) 20 MG capsule Take 1 capsule (20 mg total) by mouth daily.  90 capsule  3  . ondansetron (ZOFRAN) 8 MG tablet Take 1 tablet (8 mg total) by mouth 2 (two) times daily. Take two times a day starting the day after chemo for 3 days. Then take two times a day as needed for nausea or vomiting.  30 tablet  1  . predniSONE (DELTASONE) 5 MG tablet Take 1 tablet (5 mg total) by mouth daily.  30 tablet  2  . PREVIDENT 5000 BOOSTER PLUS 1.1 % PSTE Take 1 application by mouth Twice daily.      . Probiotic Product (PROBIOTIC DAILY PO) Take 1 each by mouth.      . prochlorperazine (COMPAZINE) 10 MG tablet Take 1 tablet (10 mg total) by mouth every 6 (six) hours as needed (Nausea or vomiting).  30 tablet  1  . prochlorperazine (COMPAZINE) 25 MG suppository Place 1 suppository (25 mg total) rectally every 12 (twelve) hours as needed for nausea.  12 suppository  3  . spironolactone (ALDACTONE) 25 MG tablet Take 0.5 tablets (12.5 mg total) by mouth daily.  30 tablet  3   No current facility-administered medications for this visit.   Facility-Administered Medications Ordered in Other Visits  Medication Dose Route Frequency Provider Last Rate Last Dose  . heparin lock flush 100 unit/mL  500 Units Intracatheter Once PRN Victorino December, MD      . sodium chloride 0.9 % injection 10 mL  10 mL Intracatheter PRN Victorino December, MD      . trastuzumab (HERCEPTIN) 126 mg in sodium chloride 0.9 % 250 mL chemo infusion  2 mg/kg (Treatment Plan Actual) Intravenous Once  Victorino December, MD 256 mL/hr at 06/21/12 1156 126 mg at 06/21/12 1156    SURGICAL HISTORY:  Past Surgical History  Procedure Laterality Date  . Cyst removed from left breast  1970  . Left wrist surgery   2004    with plate  . Colonoscopy    . Esophagogastroduodenoscopy    . Mastectomy, radical  05/11/2011    right   . Mastectomy modified radical  05/10/2012    Procedure: MASTECTOMY MODIFIED RADICAL;  Surgeon: Robyne Askew, MD;  Location: [facility] OR;  Service: General;  Laterality: Right;  RIGHT MODIFIED RADICAL MASTECTOMY  . Portacath placement  05/10/2012    Procedure: INSERTION PORT-A-CATH;  Surgeon: Robyne Askew, MD;  Location: [facility] OR;  Service: General;  Laterality: Left;  . Wrist fracture surgery  2004    REVIEW OF SYSTEMS:  See "interval history" above   PHYSICAL EXAMINATION: Blood pressure 145/80, pulse 105, temperature 97.7 F (36.5 C), temperature source Oral, resp. rate 18, height 5\' 4"  (1.626 m), weight 134 lb 6 oz (60.952 kg). Body mass index is 23.05 kg/(m^2). Sclerae unicteric Oropharynx clear No cervical or supraclavicular adenopathy Lungs no rales or rhonchi Heart regular rate and rhythm Abd benign MSK no focal spinal tenderness, no peripheral edema Neuro: nonfocal Breasts: The right breast is status post mastectomy. There is no evidence of local recurrence. The right axilla is benign. The left breast is unremarkable.  ECOG PERFORMANCE STATUS: 1   LABORATORY DATA: Lab Results  Component Value Date   WBC 1.7* 06/21/2012   HGB 13.6 06/21/2012   HCT 40.1 06/21/2012   MCV 84.6 06/21/2012   PLT 115* 06/21/2012      Chemistry      Component Value Date/Time   NA 132* 06/14/2012 1026   NA 138 05/11/2012 0640   K 4.2 06/14/2012 1026   K 4.7 05/11/2012 0640   CL 96* 06/14/2012 1026   CL 99 05/11/2012 0640   CO2 25 06/14/2012 1026   CO2 20 05/11/2012 0640   BUN 22.4 06/14/2012 1026   BUN 12 05/11/2012 0640   CREATININE 0.9 06/14/2012 1026   CREATININE 0.88 05/11/2012 0640       Component Value Date/Time   CALCIUM 10.2 06/14/2012 1026   CALCIUM 9.7 05/11/2012 0640   ALKPHOS 85 06/14/2012 1026   AST 20 06/14/2012 1026   ALT 20 06/14/2012 1026   BILITOT 0.56 06/14/2012 1026     ADDITIONAL INFORMATION: CHROMOGENIC IN-SITU HYBRIDIZATION (1C - TUMOR WITH CLIP) Interpretation HER-2/NEU BY CISH - NO AMPLIFICATION OF HER-2 DETECTED. THE RATIO OF HER-2: CEP 17 SIGNALS WAS 1.25. Reference range: Ratio: HER2:CEP17 < 1.8 - gene amplification not observed Ratio: HER2:CEP 17 1.8-2.2 - equivocal result Ratio: HER2:CEP17 > 2.2 - gene amplification observed CHROMOGENIC IN-SITU HYBRIDIZATION (1E - TUMOR WITHOUT CLIP) Interpretation: HER2/NEU BY CISH - SHOWS AMPLIFICATION BY CISH ANALYSIS. THE RATIO OF HER2: CEP 17 SIGNALS WAS 2.83. Reference range: Ratio: HER2:CEP17 < 1.8 gene amplification not observed Ratio: HER2:CEP 17 1.8-2.2 - equivocal result Ratio: HER2:CEP17 > 2.2 - gene amplification observed Pecola Leisure MD Pathologist, Electronic Signature ( Signed 05/15/2012) FINAL DIAGNOSIS Diagnosis Breast, modified radical mastectomy , right, with axillary contents 1 of 4 FINAL for Heather [name], STANNARD S ([id number]) Diagnosis(continued) TUMOR #1 (WITH CLIP): - INVASIVE LOBULAR CARCINOMA, GRADE II, SEE COMMENT. - LYMPHOVASCULAR INVASION IDENTIFIED. - PREVIOUS BIOPSY SITE IDENTIFIED. TUMOR #2 (NO CLIP): - INVASIVE LOBULAR CARCINOMA, GRADE II, SEE COMMENT. - LOBULAR CARCINOMA IN SITU. - LYMPHOVASCULAR INVASION IDENTIFIED. - THREE LYMPH NODES, POSITIVE FOR METASTATIC MAMMARY CARCINOMA (3/7). - TUMOR DEPOSITS ARE 1.7, 1.6, AND 5 CM. - EXTRACAPSULAR TUMOR EXTENSION IDENTIFIED. - TWO LYMPH NODES, POSITIVE FOR ISOLATED TUMOR CELLS. Microscopic Comment BREAST, INVASIVE TUMOR, WITH LYMPH NODE SAMPLING Specimen, including laterality: Right breast Procedure: Modified radical mastectomy Grade: Tumor 1 and tumor 2: Grade II of III Tubule formation: 3 Nuclear pleomorphism: 2 Mitotic: 1 Tumor  size (gross measurement: Tumor #1- 2.3 cm; tumor #2 - 2.4 cm Margins: Invasive, distance to closest margin: Tumor #1 2.7 cm - deep margin; tumor #  2 - 1.6 cm - deep margin Lymphovascular invasion: Present Ductal carcinoma in situ: Absent Grade: N/A Extensive intraductal component: N/A Lobular neoplasia: Both atypical hyperplasia and in situ carcinoma Extent of tumor: Skin: Grossly negative Nipple: Grossly negative Skeletal muscle: Microscopically negative Lymph nodes: # examined: 7 Lymph nodes with metastasis: 3 Isolated tumor cells (< 0.2 mm): two lymph nodes (Slide 1P and 1O) Micrometastasis: (> 0.2 mm and < 2.0 mm): none Macrometastasis: (> 2.0 mm): 1.7 cm , 1.6 cm and 5 cm Extracapsular extension: Present Breast prognostic profile: Tumor #1: Estrogen receptor: Not repeated, previous study demonstrated 98% positivity ([id number]) Progesterone receptor: Not repeated, previous study demonstrated 0% positivity ([id number]) Her 2 neu: Repeated, previous study demonstrated no amplification (1.38) ([id number]) Ki-67: Not repeated, previous study demonstrated 5% proliferation rate ([id number]) Tumor #2: Estrogen receptor: Not repeated, previous study demonstrated 97% positivity ([id number]) Progesterone receptor: Not repeated, previous study demonstrated 0% positivity ([id number]) Her 2 neu: Repeated, previous study demonstrated amplification (3.65) ([id number]) Ki-67: Not repeated, previous study demonstrated 44% proliferation rate ([id number]) 2 of 4 FINAL for Heather [name], Heather [name] ([id number]) Microscopic Comment(continued) Non-neoplastic breast: Benign fibrocystic change with usual ductal hyperplasia, sclerosing adenosis, benign radial sclerosing lesion, and microcalcifications in benign ducts and lobules. TNM: mpT2, pN1a, pMX. Comments: none  RADIOGRAPHIC STUDIES:  Mr Lodema Pilot Contrast  03/23/2012  *RADIOLOGY REPORT*  Clinical Data: New diagnosis breast cancer.  Rule out  metastatic disease.  MRI HEAD WITHOUT AND WITH CONTRAST  Technique:  Multiplanar, multiecho pulse sequences of the brain and surrounding structures were obtained according to standard protocol without and with intravenous contrast  Contrast: 12mL MULTIHANCE GADOBENATE DIMEGLUMINE 529 MG/ML IV SOLN  Comparison: None.  Findings: Ventricle size is normal.  Small 4 mm hyperintensities in the cerebral white matter bilaterally do not enhance and are most likely due to chronic microvascular ischemia.  Negative for acute infarct.  Diffusion weighted imaging is normal.  Pituitary is normal in size.  Negative for intracranial hemorrhage.  Following contrast infusion, no enhancing lesions are identified.  Paranasal sinuses are clear.  Vessels at the base of the brain are patent.  IMPRESSION: Negative for metastatic disease.  No acute abnormality.   Original Report Authenticated By: Janeece Riggers, M.D.    Mr Breast Bilateral W Wo Contrast  03/12/2012  *RADIOLOGY REPORT*  Clinical Data: Recently diagnosed invasive mammary carcinoma in the 10 o'clock region of the right breast with a metastatic right axillary lymph node.  BILATERAL BREAST MRI WITH AND WITHOUT CONTRAST  Technique: Multiplanar, multisequence MR images of both breasts were obtained prior to and following the intravenous administration of 12ml of multihance.  Three dimensional images were evaluated at the independent DynaCad workstation.  Comparison:  Mammograms dated 03/05/2012, 03/01/2012, 03/01/2011 and 02/26/2010 from [facility].  Findings: There is a moderate background parenchymal enhancement pattern.  Right breast: 1.  There is a lobulated 2.2 x 1.7 x 1.8 cm mass in the posterior third of the upper outer quadrant of the right breast.  There is a post biopsy clip artifact 1 cm lateral to the mass. 2.  In the anterior third upper outer quadrant of the right breast there is patchy non mass like enhancement measuring 0.8 x 0.3 x 0.3 cm.  Left breast:   There is no abnormal enhancement the left breast.  There are enlarged right axillary lymph nodes.  The largest right axillary lymph node measures 2.4 cm and is located lateral to the pectoralis minor consistent with level I adenopathy.  There is a 1.6 cm lymph node behind the pectoralis minor consistent with enlarged level II adenopathy.  IMPRESSION:  1.  2.2 cm mass in the posterior third of the upper outer quadrant of the right breast corresponding with the known invasive mammary carcinoma. 2.  Patchy non mass-like enhancement in the anterior third of the upper outer quadrant of the right breast.  MR guided core biopsy is recommended. 3. Enlarged level I and II right axillary adenopathy.  RECOMMENDATION: MR guided core biopsy of the right breast.  THREE-DIMENSIONAL MR IMAGE RENDERING ON INDEPENDENT WORKSTATION:  Three-dimensional MR images were rendered by post-processing of the original MR data on an independent workstation.  The three- dimensional MR images were interpreted, and findings were reported in the accompanying complete MRI report for this study.  BI-RADS CATEGORY 4:  Suspicious abnormality - biopsy should be considered.   Original Report Authenticated By: Baird Lyons, M.D.    Mr Biopsy/wire Localization  03/20/2012  *RADIOLOGY REPORT*  Clinical Data:  Known right breast carcinoma.  Additional area of worrisome enhancement located within the anterior upper outer quadrant of the right breast.  MRI GUIDED VACUUM ASSISTED BIOPSY OF THE RIGHT BREAST WITHOUT AND WITH CONTRAST  Comparison: Previous exams.  Technique: Multiplanar, multisequence MR images of the right breast were obtained prior to and following the intravenous administration of 12 ml of Mulithance.  I met with the patient, and we discussed the procedure of MRI guided biopsy, including risks, benefits, and alternatives. Specifically, we discussed the risks of infection, bleeding, tissue injury, clip migration, and inadequate sampling.   Informed, written consent was given.  Using sterile technique, 2% Lidocaine, MRI guidance, and a 9 gauge vacuum assisted device, biopsy was performed of the area of enhancement located within the anterior portion of the upper-outer quadrant - right breast using a lateral approach.  At the conclusion of the procedure, a  tissue marker clip was deployed into the biopsy cavity.  IMPRESSION: MRI guided biopsy of the area of enhancement located within the anterior portion of the upper-outer quadrant of the right breast as discussed above. No apparent complications.  BI-RADS CATEGORY 6:  Known biopsy-proven malignancy - appropriate action should be taken.  THREE-DIMENSIONAL MR IMAGE RENDERING ON INDEPENDENT WORKSTATION:  Three-dimensional MR images were rendered by post-processing of the original MR data on an independent workstation.  The three- dimensional MR images were interpreted, and findings were reported in the accompanying complete MRI report for this study.  The pathology associated with the right breast MR guided core biopsy demonstrated invasive mammary carcinoma and in situ mammary carcinoma.  This is concordant with imaging findings.  I have discussed findings with the patient by telephone and answered her questions.  The patient states that biopsy site is clean and dry without hematoma formation or signs of infection.  Post biopsy wound care instructions were reviewed with the patient.  Follow-up will be with Dr. Carolynne Edouard and Dr. Welton Flakes regards her treatment plan.  The patient was encouraged to call the Breast Center for additional questions or concerns   Original Report Authenticated By: Rolla Plate, M.D.    Nm Pet Image Initial (pi) Skull Base To Thigh  03/22/2012  *RADIOLOGY REPORT*  Clinical Data: Initial treatment strategy for high risk right breast cancer.  NUCLEAR MEDICINE PET SKULL BASE TO THIGH  Fasting Blood Glucose:  100  Technique:  18.6 mCi F-18 FDG was injected intravenously. CT data was  obtained and used for attenuation correction and anatomic localization only.  (  This was not acquired as a diagnostic CT examination.) Additional exam technical data entered on technologist worksheet.  Comparison:  MRI breast dated 03/12/2012  Findings:  Neck: No hypermetabolic lymph nodes in the neck.  Chest:  Vague hypermetabolism in the upper/central right breast, max SUV 2.7.  Additional focal hypermetabolism in the upper/outer right breast, max SUV 2.3 (PET image 105).  These findings correspond to biopsy-proven primary breast carcinoma.  1.3 cm short axis right subpectoral lymph node (series 2/image 66), max SUV 3.1.  Right axillary lymphadenopathy measuring up to 1.6 cm short axis (series 2/image 88), max SUV 4.8).  No hypermetabolic mediastinal or hilar nodes.  No suspicious pulmonary nodules on the CT scan.  Abdomen/Pelvis:  No abnormal hypermetabolic activity within the liver, pancreas, adrenal glands, or spleen.  No hypermetabolic lymph nodes in the abdomen or pelvis.  Skeleton:  No focal hypermetabolic activity to suggest skeletal metastasis.  IMPRESSION: Two foci of hypermetabolism in the upper right breast, max SUV 2.7, corresponding to biopsy-proven primary breast carcinoma.  Associated right subpectoral and axillary nodal metastases, max SUV 4.8.  No evidence of distant metastases.   Original Report Authenticated By: Charline Bills, M.D.    Mm Digital Diagnostic Unilat R  03/20/2012  *RADIOLOGY REPORT*  Clinical Data:  Post right breast MR guided core biopsy.  DIGITAL DIAGNOSTIC RIGHT BREAST MAMMOGRAM  Comparison:  03/05/2012, 03/01/2012, 03/01/2011, 02/26/2010 mammograms [facility].  Findings:  Films are performed following MR guided biopsy of the anterior portion of the upper-outer quadrant of the right breast. The hourglass shaped clip placed during the MR guided core biopsy appears in appropriate position.  IMPRESSION: Appropriate positioning of clip following right breast MR guided  core biopsy.   Original Report Authenticated By: Rolla Plate, M.D.     ASSESSMENT: 66 year old [place], [place] woman  #1  status post mastectomy with right axillary lymph node dissection 05/10/2012 for an mpT2 N1, stage stage IIB invasive lobular breast cancer, stage IIB, ER positive, HER-2/neu positive, with an Mib-1 between 5% and 44%  #2 receiving adjuvant chemotherapy consisting of Taxotere, carboplatinum and Herceptin given every 21 days for total of 6 cycles of Taxotere carboplatinum and weekly Herceptin beginning on 06/14/12.  #3 Herceptin to be continued to total one year; most recent echo 03/28/2012  PLAN:  Heather [name] did moderately well with her first cycle of chemotherapy, but there are some symptoms that I think we can ameliorate. First of all she is going to start omeprazole 20 mg at bedtime now and continue that until she is done with chemotherapy. Of she had significant malaise coming off the dexamethasone, so I have written her a prescription for prednisone 5 mg to take 1 tablet at breakfast beginning the day after her last dose of dexamethasone, and to continue for 3-4 days. She really has lorazepam available and I have encouraged her to take that on the days that she received dexamethasone. I think that will help with the GERD, insomnia, and malaise/weakness problems. The other issue she is having is constipation. I asked her not to use any suppositories since she is neutropenic. Instead she will use Senokot-S 2 tablets twice daily and MiraLAX daily beginning 3 days before chemotherapy and continuing at least 4 days after chemotherapy.  I think if she puts these suggestions and to affect her second cycle will be considerably easier. She will go ahead and receive her Herceptin today. She understands she is currently neutropenic and if she develops a temperature of 100 or more  she must call u. She already has an appointment for next week.   MAGRINAT,GUSTAV C   06/21/2012, 11:59 AM

## 2012-06-21 NOTE — Patient Instructions (Addendum)
[facility] Discharge Instructions for Patients Receiving Chemotherapy  Today you received the following chemotherapy agents Herceptin.  To help prevent nausea and vomiting after your treatment, we encourage you to take your nausea medication as prescribed.   If you develop nausea and vomiting that is not controlled by your nausea medication, call the clinic. If it is after clinic hours your family physician or the after hours number for the clinic or go to the Emergency Department.   BELOW ARE SYMPTOMS THAT SHOULD BE REPORTED IMMEDIATELY:  *FEVER GREATER THAN 100.5 F  *CHILLS WITH OR WITHOUT FEVER  NAUSEA AND VOMITING THAT IS NOT CONTROLLED WITH YOUR NAUSEA MEDICATION  *UNUSUAL SHORTNESS OF BREATH  *UNUSUAL BRUISING OR BLEEDING  TENDERNESS IN MOUTH AND THROAT WITH OR WITHOUT PRESENCE OF ULCERS  *URINARY PROBLEMS  *BOWEL PROBLEMS  UNUSUAL RASH Items with * indicate a potential emergency and should be followed up as soon as possible.  Feel free to call the clinic you have any questions or concerns. The clinic phone number is [phone].   I have been informed and understand all the instructions given to me. I know to contact the clinic, my physician, or go to the Emergency Department if any problems should occur. I do not have any questions at this time, but understand that I may call the clinic during office hours   should I have any questions or need assistance in obtaining follow up care.    __________________________________________  _____________  __________ Signature of Patient or Authorized Representative            Date                   Time    __________________________________________ Nurse's Signature    

## 2012-06-21 NOTE — Progress Notes (Signed)
OFFICE PROGRESS NOTE  CC  [name], MD [address] [place] [address] Dr. Chevis Pretty Dr. Chipper Herb  DIAGNOSIS: 67 year old female with invasive lobular breast cancer, stage IIB, receiving adjuvant chemotherapy  PRIOR THERAPY:  #1 patient was originally seen in the multidisciplinary breast clinic on 03/14/2012 for new diagnosis of invasive ductal carcinoma that was HER-2 positive node-positive, ER positive PR negative with Ki-67 of 44%  #2 on MRI patient also had an anterior breast mass that was to be biopsied to decide whether or not she should receive neoadjuvant chemotherapy or adjuvant chemotherapy. Patient's mass was biopsied and it was positive for a malignancy that was HER-2/neu negative.  #3 patient is now status post right mastectomy with axillary lymph node dissection with the final pathology revealing the largest tumor measuring 5 cm, 1.7 cm and 1.6 cm. Tumor was ER positive PR negative HER-2/neu positive with a Ki-67 of 44% 3 of 7 lymph nodes were positive for metastatic disease.  #4 patient will proceed with adjuvant chemotherapy and Herceptin. Chemotherapy will consist of Taxotere carboplatinum given every 21 days with weekly Herceptin. Total of 6 cycles of Taxotere carboplatin will be administered starting on 06/14/2012. Once patient completes her chemotherapy then she will go on to receive radiation therapy along with Herceptin with the Herceptin then being converted to every 3 weeks to finish a total of one year. Because patient's tumor is ER positive she will also be given antiestrogen therapy adjuvantly.  CURRENT THERAPY:Cycle 1 day 8 of TCH  INTERVAL HISTORY: Returns today for followup of her breast cancer. She initially did well with her chemotherapy, but the dexamethasone "red her out" and she couldn't sleep for several days. She had Ativan available but did not use it. When she came off the dexamethasone she "slumped", stopped eating, felt terrible  all over, and very weak. Aside from these issues, she has been significantly constipated. She took a Dulcolax suppositories with minimal results. She is also now having more at GERD problems. She's got taste perversion and loss of appetite, but no significant problems with nausea or vomiting. She has some blurred vision. Her teeth and tongue felt "numb". She got back pain from the Neulasta. There have been no fevers, bleeding, or rash, and her port is working well. A detailed review of systems was otherwise noncontributory  MEDICAL HISTORY: Past Medical History  Diagnosis Date  . Breast cancer   . Skin cancer   . Chronic kidney disease   . Hx: UTI (urinary tract infection)   . Right shoulder pain   . Right knee pain   . Hypertension     recently started Aldactone   . Hyperlipidemia     but not on meds;diet and exercise controlled  . Dizziness     has been going on for 102months;medical MD aware  . H/O hiatal hernia   . GERD (gastroesophageal reflux disease)     Tums prn  . Hemorrhoids   . Urinary frequency     d/t taking Aldactone  . History of UTI   . Anemia     hx of  . Cataract     immature-not sure of which eye  . Anxiety     r/t updated surgery  . Insomnia     takes Melatoniin daily  . Complication of anesthesia     pt states she is sensitive to meds    ALLERGIES:  is allergic to codeine; keflex; naprosyn; and tussin.  MEDICATIONS:  Current Outpatient  Prescriptions  Medication Sig Dispense Refill  . B Complex-C (SUPER B COMPLEX PO) Take 1 each by mouth daily.      . calcium carbonate (TUMS - DOSED IN MG ELEMENTAL CALCIUM) 500 MG chewable tablet Chew 1 tablet by mouth daily.      . Cholecalciferol (VITAMIN D-3 PO) Take 4,000 Units by mouth daily.      Marland Kitchen dexamethasone (DECADRON) 4 MG tablet Take 2 tablets (8 mg total) by mouth 2 (two) times daily with a meal. Take two times a day the day before Taxotere. Then take two times a day starting the day after chemo for 3 days.   30 tablet  1  . HYDROcodone-acetaminophen (NORCO/VICODIN) 5-325 MG per tablet Take 1 tablet by mouth every 6 (six) hours as needed.      . lidocaine-prilocaine (EMLA) cream Apply topically as needed.  30 g  7  . LORazepam (ATIVAN) 0.5 MG tablet Take 1 tablet (0.5 mg total) by mouth every 6 (six) hours as needed (Nausea or vomiting).  30 tablet  0  . magnesium gluconate (MAGONATE) 500 MG tablet Take 500 mg by mouth 2 (two) times daily.      Marland Kitchen omeprazole (PRILOSEC) 20 MG capsule Take 1 capsule (20 mg total) by mouth daily.  90 capsule  3  . ondansetron (ZOFRAN) 8 MG tablet Take 1 tablet (8 mg total) by mouth 2 (two) times daily. Take two times a day starting the day after chemo for 3 days. Then take two times a day as needed for nausea or vomiting.  30 tablet  1  . predniSONE (DELTASONE) 5 MG tablet Take 1 tablet (5 mg total) by mouth daily.  30 tablet  2  . PREVIDENT 5000 BOOSTER PLUS 1.1 % PSTE Take 1 application by mouth Twice daily.      . Probiotic Product (PROBIOTIC DAILY PO) Take 1 each by mouth.      . prochlorperazine (COMPAZINE) 10 MG tablet Take 1 tablet (10 mg total) by mouth every 6 (six) hours as needed (Nausea or vomiting).  30 tablet  1  . prochlorperazine (COMPAZINE) 25 MG suppository Place 1 suppository (25 mg total) rectally every 12 (twelve) hours as needed for nausea.  12 suppository  3  . spironolactone (ALDACTONE) 25 MG tablet Take 0.5 tablets (12.5 mg total) by mouth daily.  30 tablet  3   No current facility-administered medications for this visit.    SURGICAL HISTORY:  Past Surgical History  Procedure Laterality Date  . Cyst removed from left breast  1970  . Left wrist surgery   2004    with plate  . Colonoscopy    . Esophagogastroduodenoscopy    . Mastectomy, radical  05/11/2011    right   . Mastectomy modified radical  05/10/2012    Procedure: MASTECTOMY MODIFIED RADICAL;  Surgeon: Robyne Askew, MD;  Location: [facility] OR;  Service: General;  Laterality: Right;  RIGHT  MODIFIED RADICAL MASTECTOMY  . Portacath placement  05/10/2012    Procedure: INSERTION PORT-A-CATH;  Surgeon: Robyne Askew, MD;  Location: [facility] OR;  Service: General;  Laterality: Left;  . Wrist fracture surgery  2004    REVIEW OF SYSTEMS:  See "interval history" above   PHYSICAL EXAMINATION: Blood pressure 145/80, pulse 105, temperature 97.7 F (36.5 C), temperature source Oral, resp. rate 18, height 5\' 4"  (1.626 m), weight 134 lb 6 oz (60.952 kg). Body mass index is 23.05 kg/(m^2). Sclerae unicteric Oropharynx clear No cervical  or supraclavicular adenopathy Lungs no rales or rhonchi Heart regular rate and rhythm Abd benign MSK no focal spinal tenderness, no peripheral edema Neuro: nonfocal Breasts: The right breast is status post mastectomy. There is no evidence of local recurrence. The right axilla is benign. The left breast is unremarkable.  ECOG PERFORMANCE STATUS: 1   LABORATORY DATA: Lab Results  Component Value Date   WBC 1.7* 06/21/2012   HGB 13.6 06/21/2012   HCT 40.1 06/21/2012   MCV 84.6 06/21/2012   PLT 115* 06/21/2012      Chemistry      Component Value Date/Time   NA 132* 06/14/2012 1026   NA 138 05/11/2012 0640   K 4.2 06/14/2012 1026   K 4.7 05/11/2012 0640   CL 96* 06/14/2012 1026   CL 99 05/11/2012 0640   CO2 25 06/14/2012 1026   CO2 20 05/11/2012 0640   BUN 22.4 06/14/2012 1026   BUN 12 05/11/2012 0640   CREATININE 0.9 06/14/2012 1026   CREATININE 0.88 05/11/2012 0640      Component Value Date/Time   CALCIUM 10.2 06/14/2012 1026   CALCIUM 9.7 05/11/2012 0640   ALKPHOS 85 06/14/2012 1026   AST 20 06/14/2012 1026   ALT 20 06/14/2012 1026   BILITOT 0.56 06/14/2012 1026     ADDITIONAL INFORMATION: CHROMOGENIC IN-SITU HYBRIDIZATION (1C - TUMOR WITH CLIP) Interpretation HER-2/NEU BY CISH - NO AMPLIFICATION OF HER-2 DETECTED. THE RATIO OF HER-2: CEP 17 SIGNALS WAS 1.25. Reference range: Ratio: HER2:CEP17 < 1.8 - gene amplification not observed Ratio: HER2:CEP 17 1.8-2.2 -  equivocal result Ratio: HER2:CEP17 > 2.2 - gene amplification observed CHROMOGENIC IN-SITU HYBRIDIZATION (1E - TUMOR WITHOUT CLIP) Interpretation: HER2/NEU BY CISH - SHOWS AMPLIFICATION BY CISH ANALYSIS. THE RATIO OF HER2: CEP 17 SIGNALS WAS 2.83. Reference range: Ratio: HER2:CEP17 < 1.8 gene amplification not observed Ratio: HER2:CEP 17 1.8-2.2 - equivocal result Ratio: HER2:CEP17 > 2.2 - gene amplification observed Pecola Leisure MD Pathologist, Electronic Signature ( Signed 05/15/2012) FINAL DIAGNOSIS Diagnosis Breast, modified radical mastectomy , right, with axillary contents 1 of 4 FINAL for AVINA, EBERLE S ([id number]) Diagnosis(continued) TUMOR #1 (WITH CLIP): - INVASIVE LOBULAR CARCINOMA, GRADE II, SEE COMMENT. - LYMPHOVASCULAR INVASION IDENTIFIED. - PREVIOUS BIOPSY SITE IDENTIFIED. TUMOR #2 (NO CLIP): - INVASIVE LOBULAR CARCINOMA, GRADE II, SEE COMMENT. - LOBULAR CARCINOMA IN SITU. - LYMPHOVASCULAR INVASION IDENTIFIED. - THREE LYMPH NODES, POSITIVE FOR METASTATIC MAMMARY CARCINOMA (3/7). - TUMOR DEPOSITS ARE 1.7, 1.6, AND 5 CM. - EXTRACAPSULAR TUMOR EXTENSION IDENTIFIED. - TWO LYMPH NODES, POSITIVE FOR ISOLATED TUMOR CELLS. Microscopic Comment BREAST, INVASIVE TUMOR, WITH LYMPH NODE SAMPLING Specimen, including laterality: Right breast Procedure: Modified radical mastectomy Grade: Tumor 1 and tumor 2: Grade II of III Tubule formation: 3 Nuclear pleomorphism: 2 Mitotic: 1 Tumor size (gross measurement: Tumor #1- 2.3 cm; tumor #2 - 2.4 cm Margins: Invasive, distance to closest margin: Tumor #1 2.7 cm - deep margin; tumor #2 - 1.6 cm - deep margin Lymphovascular invasion: Present Ductal carcinoma in situ: Absent Grade: N/A Extensive intraductal component: N/A Lobular neoplasia: Both atypical hyperplasia and in situ carcinoma Extent of tumor: Skin: Grossly negative Nipple: Grossly negative Skeletal muscle: Microscopically negative Lymph nodes: # examined: 7 Lymph  nodes with metastasis: 3 Isolated tumor cells (< 0.2 mm): two lymph nodes (Slide 1P and 1O) Micrometastasis: (> 0.2 mm and < 2.0 mm): none Macrometastasis: (> 2.0 mm): 1.7 cm , 1.6 cm and 5 cm Extracapsular extension: Present Breast prognostic profile: Tumor #1: Estrogen receptor: Not  repeated, previous study demonstrated 98% positivity ([id number]) Progesterone receptor: Not repeated, previous study demonstrated 0% positivity ([id number]) Her 2 neu: Repeated, previous study demonstrated no amplification (1.38) ([id number]) Ki-67: Not repeated, previous study demonstrated 5% proliferation rate ([id number]) Tumor #2: Estrogen receptor: Not repeated, previous study demonstrated 97% positivity ([id number]) Progesterone receptor: Not repeated, previous study demonstrated 0% positivity ([id number]) Her 2 neu: Repeated, previous study demonstrated amplification (3.65) ([id number]) Ki-67: Not repeated, previous study demonstrated 44% proliferation rate ([id number]) 2 of 4 FINAL for KAEDEN, DEPAZ (SZA14-4) Microscopic Comment(continued) Non-neoplastic breast: Benign fibrocystic change with usual ductal hyperplasia, sclerosing adenosis, benign radial sclerosing lesion, and microcalcifications in benign ducts and lobules. TNM: mpT2, pN1a, pMX. Comments: none  RADIOGRAPHIC STUDIES:  Mr Lodema Pilot Contrast  03/23/2012  *RADIOLOGY REPORT*  Clinical Data: New diagnosis breast cancer.  Rule out metastatic disease.  MRI HEAD WITHOUT AND WITH CONTRAST  Technique:  Multiplanar, multiecho pulse sequences of the brain and surrounding structures were obtained according to standard protocol without and with intravenous contrast  Contrast: 12mL MULTIHANCE GADOBENATE DIMEGLUMINE 529 MG/ML IV SOLN  Comparison: None.  Findings: Ventricle size is normal.  Small 4 mm hyperintensities in the cerebral white matter bilaterally do not enhance and are most likely due to chronic microvascular ischemia.  Negative  for acute infarct.  Diffusion weighted imaging is normal.  Pituitary is normal in size.  Negative for intracranial hemorrhage.  Following contrast infusion, no enhancing lesions are identified.  Paranasal sinuses are clear.  Vessels at the base of the brain are patent.  IMPRESSION: Negative for metastatic disease.  No acute abnormality.   Original Report Authenticated By: Janeece Riggers, M.D.    Mr Breast Bilateral W Wo Contrast  03/12/2012  *RADIOLOGY REPORT*  Clinical Data: Recently diagnosed invasive mammary carcinoma in the 10 o'clock region of the right breast with a metastatic right axillary lymph node.  BILATERAL BREAST MRI WITH AND WITHOUT CONTRAST  Technique: Multiplanar, multisequence MR images of both breasts were obtained prior to and following the intravenous administration of 12ml of multihance.  Three dimensional images were evaluated at the independent DynaCad workstation.  Comparison:  Mammograms dated 03/05/2012, 03/01/2012, 03/01/2011 and 02/26/2010 from [facility].  Findings: There is a moderate background parenchymal enhancement pattern.  Right breast: 1.  There is a lobulated 2.2 x 1.7 x 1.8 cm mass in the posterior third of the upper outer quadrant of the right breast.  There is a post biopsy clip artifact 1 cm lateral to the mass. 2.  In the anterior third upper outer quadrant of the right breast there is patchy non mass like enhancement measuring 0.8 x 0.3 x 0.3 cm.  Left breast:  There is no abnormal enhancement the left breast.  There are enlarged right axillary lymph nodes.  The largest right axillary lymph node measures 2.4 cm and is located lateral to the pectoralis minor consistent with level I adenopathy.  There is a 1.6 cm lymph node behind the pectoralis minor consistent with enlarged level II adenopathy.  IMPRESSION:  1.  2.2 cm mass in the posterior third of the upper outer quadrant of the right breast corresponding with the known invasive mammary carcinoma. 2.  Patchy  non mass-like enhancement in the anterior third of the upper outer quadrant of the right breast.  MR guided core biopsy is recommended. 3. Enlarged level I and II right axillary adenopathy.  RECOMMENDATION: MR guided core biopsy of the right breast.  THREE-DIMENSIONAL MR IMAGE RENDERING ON INDEPENDENT WORKSTATION:  Three-dimensional MR images were rendered by post-processing of the original MR data on an independent workstation.  The three- dimensional MR images were interpreted, and findings were reported in the accompanying complete MRI report for this study.  BI-RADS CATEGORY 4:  Suspicious abnormality - biopsy should be considered.   Original Report Authenticated By: Baird Lyons, M.D.    Mr Biopsy/wire Localization  03/20/2012  *RADIOLOGY REPORT*  Clinical Data:  Known right breast carcinoma.  Additional area of worrisome enhancement located within the anterior upper outer quadrant of the right breast.  MRI GUIDED VACUUM ASSISTED BIOPSY OF THE RIGHT BREAST WITHOUT AND WITH CONTRAST  Comparison: Previous exams.  Technique: Multiplanar, multisequence MR images of the right breast were obtained prior to and following the intravenous administration of 12 ml of Mulithance.  I met with the patient, and we discussed the procedure of MRI guided biopsy, including risks, benefits, and alternatives. Specifically, we discussed the risks of infection, bleeding, tissue injury, clip migration, and inadequate sampling.  Informed, written consent was given.  Using sterile technique, 2% Lidocaine, MRI guidance, and a 9 gauge vacuum assisted device, biopsy was performed of the area of enhancement located within the anterior portion of the upper-outer quadrant - right breast using a lateral approach.  At the conclusion of the procedure, a  tissue marker clip was deployed into the biopsy cavity.  IMPRESSION: MRI guided biopsy of the area of enhancement located within the anterior portion of the upper-outer quadrant of the right  breast as discussed above. No apparent complications.  BI-RADS CATEGORY 6:  Known biopsy-proven malignancy - appropriate action should be taken.  THREE-DIMENSIONAL MR IMAGE RENDERING ON INDEPENDENT WORKSTATION:  Three-dimensional MR images were rendered by post-processing of the original MR data on an independent workstation.  The three- dimensional MR images were interpreted, and findings were reported in the accompanying complete MRI report for this study.  The pathology associated with the right breast MR guided core biopsy demonstrated invasive mammary carcinoma and in situ mammary carcinoma.  This is concordant with imaging findings.  I have discussed findings with the patient by telephone and answered her questions.  The patient states that biopsy site is clean and dry without hematoma formation or signs of infection.  Post biopsy wound care instructions were reviewed with the patient.  Follow-up will be with Dr. Carolynne Edouard and Dr. Welton Flakes regards her treatment plan.  The patient was encouraged to call the Breast Center for additional questions or concerns   Original Report Authenticated By: Rolla Plate, M.D.    Nm Pet Image Initial (pi) Skull Base To Thigh  03/22/2012  *RADIOLOGY REPORT*  Clinical Data: Initial treatment strategy for high risk right breast cancer.  NUCLEAR MEDICINE PET SKULL BASE TO THIGH  Fasting Blood Glucose:  100  Technique:  18.6 mCi F-18 FDG was injected intravenously. CT data was obtained and used for attenuation correction and anatomic localization only.  (This was not acquired as a diagnostic CT examination.) Additional exam technical data entered on technologist worksheet.  Comparison:  MRI breast dated 03/12/2012  Findings:  Neck: No hypermetabolic lymph nodes in the neck.  Chest:  Vague hypermetabolism in the upper/central right breast, max SUV 2.7.  Additional focal hypermetabolism in the upper/outer right breast, max SUV 2.3 (PET image 105).  These findings correspond to  biopsy-proven primary breast carcinoma.  1.3 cm short axis right subpectoral lymph node (series 2/image 66), max SUV 3.1.  Right axillary lymphadenopathy measuring up to 1.6 cm short axis (series  2/image 88), max SUV 4.8).  No hypermetabolic mediastinal or hilar nodes.  No suspicious pulmonary nodules on the CT scan.  Abdomen/Pelvis:  No abnormal hypermetabolic activity within the liver, pancreas, adrenal glands, or spleen.  No hypermetabolic lymph nodes in the abdomen or pelvis.  Skeleton:  No focal hypermetabolic activity to suggest skeletal metastasis.  IMPRESSION: Two foci of hypermetabolism in the upper right breast, max SUV 2.7, corresponding to biopsy-proven primary breast carcinoma.  Associated right subpectoral and axillary nodal metastases, max SUV 4.8.  No evidence of distant metastases.   Original Report Authenticated By: Charline Bills, M.D.    Mm Digital Diagnostic Unilat R  03/20/2012  *RADIOLOGY REPORT*  Clinical Data:  Post right breast MR guided core biopsy.  DIGITAL DIAGNOSTIC RIGHT BREAST MAMMOGRAM  Comparison:  03/05/2012, 03/01/2012, 03/01/2011, 02/26/2010 mammograms from [facility] imaging.  Findings:  Films are performed following MR guided biopsy of the anterior portion of the upper-outer quadrant of the right breast. The hourglass shaped clip placed during the MR guided core biopsy appears in appropriate position.  IMPRESSION: Appropriate positioning of clip following right breast MR guided core biopsy.   Original Report Authenticated By: Rolla Plate, M.D.     ASSESSMENT: 67 year old [place], [place] woman  #1  status post mastectomy with right axillary lymph node dissection 05/10/2012 for an mpT2 N1, stage stage IIB invasive lobular breast cancer, stage IIB, ER positive, HER-2/neu positive, with an Mib-1 between 5% and 44%  #2 receiving adjuvant chemotherapy consisting of Taxotere, carboplatinum and Herceptin given every 21 days for total of 6 cycles of Taxotere carboplatinum and  weekly Herceptin beginning on 06/14/12.  #3 Herceptin to be continued to total one year; most recent echo 03/28/2012  PLAN:  Brianca did moderately well with her first cycle of chemotherapy, but there are some symptoms that I think we can ameliorate. First of all she is going to start omeprazole 20 mg at bedtime now and continue that until she is done with chemotherapy. Of she had significant malaise coming off the dexamethasone, so I have written her a prescription for prednisone 5 mg to take 1 tablet at breakfast beginning the day after her last dose of dexamethasone, and to continue for 3-4 days. She really has lorazepam available and I have encouraged her to take that on the days that she received dexamethasone. I think that will help with the GERD, insomnia, and malaise/weakness problems. The other issue she is having is constipation. I asked her not to use any suppositories since she is neutropenic. Instead she will use Senokot-S 2 tablets twice daily and MiraLAX daily beginning 3 days before chemotherapy and continuing at least 4 days after chemotherapy.  I think if she puts these suggestions and to affect her second cycle will be considerably easier. She will go ahead and receive her Herceptin today. She understands she is currently neutropenic and if she develops a temperature of 100 or more she must call u. She already has an appointment for next week.   Cherie Ouch Lyn Hollingshead, NP Medical Oncology [facility] Phone: [phone] 06/21/2012, 11:12 AM

## 2012-06-23 ENCOUNTER — Other Ambulatory Visit: Payer: Self-pay

## 2012-06-28 ENCOUNTER — Other Ambulatory Visit: Payer: Medicare Other | Admitting: Lab

## 2012-06-28 ENCOUNTER — Ambulatory Visit (HOSPITAL_BASED_OUTPATIENT_CLINIC_OR_DEPARTMENT_OTHER): Payer: Medicare Other | Admitting: Adult Health

## 2012-06-28 ENCOUNTER — Encounter: Payer: Self-pay | Admitting: Adult Health

## 2012-06-28 ENCOUNTER — Ambulatory Visit (HOSPITAL_BASED_OUTPATIENT_CLINIC_OR_DEPARTMENT_OTHER): Payer: Medicare Other

## 2012-06-28 VITALS — BP 147/84 | HR 79 | Temp 98.4°F | Resp 20 | Ht 64.0 in | Wt 138.0 lb

## 2012-06-28 DIAGNOSIS — Z5112 Encounter for antineoplastic immunotherapy: Secondary | ICD-10-CM

## 2012-06-28 DIAGNOSIS — C773 Secondary and unspecified malignant neoplasm of axilla and upper limb lymph nodes: Secondary | ICD-10-CM

## 2012-06-28 DIAGNOSIS — Z170 Estrogen receptor positive status [ER+]: Secondary | ICD-10-CM

## 2012-06-28 DIAGNOSIS — C50419 Malignant neoplasm of upper-outer quadrant of unspecified female breast: Secondary | ICD-10-CM

## 2012-06-28 DIAGNOSIS — R1013 Epigastric pain: Secondary | ICD-10-CM

## 2012-06-28 DIAGNOSIS — K3189 Other diseases of stomach and duodenum: Secondary | ICD-10-CM

## 2012-06-28 DIAGNOSIS — Z79899 Other long term (current) drug therapy: Secondary | ICD-10-CM

## 2012-06-28 LAB — CBC WITH DIFFERENTIAL/PLATELET
BASO%: 0.5 % (ref 0.0–2.0)
Basophils Absolute: 0 10*3/uL (ref 0.0–0.1)
EOS%: 0.8 % (ref 0.0–7.0)
Eosinophils Absolute: 0.1 10*3/uL (ref 0.0–0.5)
HCT: 36.2 % (ref 34.8–46.6)
HGB: 11.9 g/dL (ref 11.6–15.9)
LYMPH%: 19.8 % (ref 14.0–49.7)
MCH: 28.3 pg (ref 25.1–34.0)
MCHC: 32.9 g/dL (ref 31.5–36.0)
MCV: 86.2 fL (ref 79.5–101.0)
MONO#: 0.4 10*3/uL (ref 0.1–0.9)
MONO%: 4.9 % (ref 0.0–14.0)
NEUT#: 5.9 10*3/uL (ref 1.5–6.5)
NEUT%: 74 % (ref 38.4–76.8)
Platelets: 127 10*3/uL — ABNORMAL LOW (ref 145–400)
RBC: 4.2 10*6/uL (ref 3.70–5.45)
RDW: 14 % (ref 11.2–14.5)
WBC: 8 10*3/uL (ref 3.9–10.3)
lymph#: 1.6 10*3/uL (ref 0.9–3.3)
nRBC: 0 % (ref 0–0)

## 2012-06-28 LAB — COMPREHENSIVE METABOLIC PANEL (CC13)
ALT: 38 U/L (ref 0–55)
AST: 23 U/L (ref 5–34)
Albumin: 3.4 g/dL — ABNORMAL LOW (ref 3.5–5.0)
Alkaline Phosphatase: 94 U/L (ref 40–150)
BUN: 10.2 mg/dL (ref 7.0–26.0)
CO2: 28 mEq/L (ref 22–29)
Calcium: 9.2 mg/dL (ref 8.4–10.4)
Chloride: 100 mEq/L (ref 98–107)
Creatinine: 0.8 mg/dL (ref 0.6–1.1)
Glucose: 91 mg/dl (ref 70–99)
Potassium: 3.8 mEq/L (ref 3.5–5.1)
Sodium: 136 mEq/L (ref 136–145)
Total Bilirubin: 0.26 mg/dL (ref 0.20–1.20)
Total Protein: 6.7 g/dL (ref 6.4–8.3)

## 2012-06-28 MED ORDER — SODIUM CHLORIDE 0.9 % IJ SOLN
10.0000 mL | INTRAMUSCULAR | Status: DC | PRN
  Administered 2012-06-28: 10 mL
  Filled 2012-06-28: qty 10

## 2012-06-28 MED ORDER — SODIUM CHLORIDE 0.9 % IV SOLN
Freq: Once | INTRAVENOUS | Status: AC
  Administered 2012-06-28: 13:00:00 via INTRAVENOUS

## 2012-06-28 MED ORDER — TRASTUZUMAB CHEMO INJECTION 440 MG
2.0000 mg/kg | Freq: Once | INTRAVENOUS | Status: AC
  Administered 2012-06-28: 126 mg via INTRAVENOUS
  Filled 2012-06-28: qty 6

## 2012-06-28 MED ORDER — DIPHENHYDRAMINE HCL 25 MG PO CAPS
50.0000 mg | ORAL_CAPSULE | Freq: Once | ORAL | Status: AC
  Administered 2012-06-28: 50 mg via ORAL

## 2012-06-28 MED ORDER — HEPARIN SOD (PORK) LOCK FLUSH 100 UNIT/ML IV SOLN
500.0000 [IU] | Freq: Once | INTRAVENOUS | Status: AC | PRN
  Administered 2012-06-28: 500 [IU]
  Filled 2012-06-28: qty 5

## 2012-06-28 MED ORDER — ACETAMINOPHEN 325 MG PO TABS
650.0000 mg | ORAL_TABLET | Freq: Once | ORAL | Status: AC
  Administered 2012-06-28: 650 mg via ORAL

## 2012-06-28 NOTE — Progress Notes (Signed)
OFFICE PROGRESS NOTE  CC  Heather Bradford, MD [address] [place] [address] Dr. Chevis Pretty Dr. Chipper Herb  DIAGNOSIS: 67 year old female with invasive lobular breast cancer, stage IIB, receiving adjuvant chemotherapy  PRIOR THERAPY:  #1 patient was originally seen in the multidisciplinary breast clinic on 03/14/2012 for new diagnosis of invasive ductal carcinoma that was HER-2 positive node-positive, ER positive PR negative with Ki-67 of 44%  #2 on MRI patient also had an anterior breast mass that was to be biopsied to decide whether or not she should receive neoadjuvant chemotherapy or adjuvant chemotherapy. Patient's mass was biopsied and it was positive for a malignancy that was HER-2/neu negative.  #3 patient is now status post right mastectomy with axillary lymph node dissection with the final pathology revealing the largest tumor measuring 5 cm, 1.7 cm and 1.6 cm. Tumor was ER positive PR negative HER-2/neu positive with a Ki-67 of 44% 3 of 7 lymph nodes were positive for metastatic disease.  #4 patient will proceed with adjuvant chemotherapy and Herceptin. Chemotherapy will consist of Taxotere carboplatinum given every 21 days with weekly Herceptin. Total of 6 cycles of Taxotere carboplatin will be administered starting on 06/14/2012. Once patient completes her chemotherapy then she will go on to receive radiation therapy along with Herceptin with the Herceptin then being converted to every 3 weeks to finish a total of one year. Because patient's tumor is ER positive she will also be given antiestrogen therapy adjuvantly.  CURRENT THERAPY:Cycle 1 day 15 of TCH  INTERVAL HISTORY: Heather [name] is doing well today.  She is feeling better, and starting to recover from her fatigue from the chemotherapy.  She is still considering whether or not she wants to take the Prednisone after the Decadron to prevent the "crash" feeling she had with the last cycle.  She is feeling better  since starting the Prilosec daily in regards to her indigestion.  She is otherwise without questions and concerns.  She denies fevers, chills, nausea, vomiting, numbness, or any other concerns.  A 10 point ROS is otherwise neg.   MEDICAL HISTORY: Past Medical History  Diagnosis Date  . Breast cancer   . Skin cancer   . Chronic kidney disease   . Hx: UTI (urinary tract infection)   . Right shoulder pain   . Right knee pain   . Hypertension     recently started Aldactone   . Hyperlipidemia     but not on meds;diet and exercise controlled  . Dizziness     has been going on for 86months;medical MD aware  . H/O hiatal hernia   . GERD (gastroesophageal reflux disease)     Tums prn  . Hemorrhoids   . Urinary frequency     d/t taking Aldactone  . History of UTI   . Anemia     hx of  . Cataract     immature-not sure of which eye  . Anxiety     r/t updated surgery  . Insomnia     takes Melatoniin daily  . Complication of anesthesia     pt states she is sensitive to meds    ALLERGIES:  is allergic to codeine; keflex; naprosyn; and tussin.  MEDICATIONS:  Current Outpatient Prescriptions  Medication Sig Dispense Refill  . B Complex-C (SUPER B COMPLEX PO) Take 1 each by mouth daily.      . calcium carbonate (TUMS - DOSED IN MG ELEMENTAL CALCIUM) 500 MG chewable tablet Chew 1 tablet by  mouth daily.      . Cholecalciferol (VITAMIN D-3 PO) Take 4,000 Units by mouth daily.      Marland Kitchen dexamethasone (DECADRON) 4 MG tablet Take 2 tablets (8 mg total) by mouth 2 (two) times daily with a meal. Take two times a day the day before Taxotere. Then take two times a day starting the day after chemo for 3 days.  30 tablet  1  . HYDROcodone-acetaminophen (NORCO/VICODIN) 5-325 MG per tablet Take 1 tablet by mouth every 6 (six) hours as needed.      . lidocaine-prilocaine (EMLA) cream Apply topically as needed.  30 g  7  . LORazepam (ATIVAN) 0.5 MG tablet Take 1 tablet (0.5 mg total) by mouth every 6 (six)  hours as needed (Nausea or vomiting).  30 tablet  0  . magnesium gluconate (MAGONATE) 500 MG tablet Take 500 mg by mouth 2 (two) times daily.      Marland Kitchen omeprazole (PRILOSEC) 20 MG capsule Take 1 capsule (20 mg total) by mouth daily.  90 capsule  3  . ondansetron (ZOFRAN) 8 MG tablet Take 1 tablet (8 mg total) by mouth 2 (two) times daily. Take two times a day starting the day after chemo for 3 days. Then take two times a day as needed for nausea or vomiting.  30 tablet  1  . predniSONE (DELTASONE) 5 MG tablet Take 1 tablet (5 mg total) by mouth daily.  30 tablet  2  . PREVIDENT 5000 BOOSTER PLUS 1.1 % PSTE Take 1 application by mouth Twice daily.      . Probiotic Product (PROBIOTIC DAILY PO) Take 1 each by mouth.      . prochlorperazine (COMPAZINE) 10 MG tablet Take 1 tablet (10 mg total) by mouth every 6 (six) hours as needed (Nausea or vomiting).  30 tablet  1  . prochlorperazine (COMPAZINE) 25 MG suppository Place 1 suppository (25 mg total) rectally every 12 (twelve) hours as needed for nausea.  12 suppository  3  . spironolactone (ALDACTONE) 25 MG tablet Take 0.5 tablets (12.5 mg total) by mouth daily.  30 tablet  3   No current facility-administered medications for this visit.   Facility-Administered Medications Ordered in Other Visits  Medication Dose Route Frequency Provider Last Rate Last Dose  . sodium chloride 0.9 % injection 10 mL  10 mL Intracatheter PRN Victorino December, MD   10 mL at 06/28/12 1408    SURGICAL HISTORY:  Past Surgical History  Procedure Laterality Date  . Cyst removed from left breast  1970  . Left wrist surgery   2004    with plate  . Colonoscopy    . Esophagogastroduodenoscopy    . Mastectomy, radical  05/11/2011    right   . Mastectomy modified radical  05/10/2012    Procedure: MASTECTOMY MODIFIED RADICAL;  Surgeon: Robyne Askew, MD;  Location: [facility] OR;  Service: General;  Laterality: Right;  RIGHT MODIFIED RADICAL MASTECTOMY  . Portacath placement  05/10/2012     Procedure: INSERTION PORT-A-CATH;  Surgeon: Robyne Askew, MD;  Location: [facility] OR;  Service: General;  Laterality: Left;  . Wrist fracture surgery  2004    REVIEW OF SYSTEMS:  See "interval history" above   PHYSICAL EXAMINATION: Blood pressure 147/84, pulse 79, temperature 98.4 F (36.9 C), temperature source Oral, resp. rate 20, height 5\' 4"  (1.626 m), weight 138 lb (62.596 kg). Body mass index is 23.68 kg/(m^2). General: Patient is a well appearing female in  no acute distress HEENT: PERRLA, sclerae anicteric no conjunctival pallor, MMM Neck: supple, no palpable adenopathy Lungs: clear to auscultation bilaterally, no wheezes, rhonchi, or rales Cardiovascular: regular rate rhythm, S1, S2, no murmurs, rubs or gallops Abdomen: Soft, non-tender, non-distended, normoactive bowel sounds, no HSM Extremities: warm and well perfused, no clubbing, cyanosis, or edema Skin: No rashes or lesions Neuro: Non-focal Breasts: The right breast is status post mastectomy. There is no evidence of local recurrence. The right axilla is benign. The left breast is unremarkable. ECOG PERFORMANCE STATUS: 1   LABORATORY DATA: Lab Results  Component Value Date   WBC 8.0 06/28/2012   HGB 11.9 06/28/2012   HCT 36.2 06/28/2012   MCV 86.2 06/28/2012   PLT 127* 06/28/2012      Chemistry      Component Value Date/Time   NA 136 06/28/2012 1115   NA 138 05/11/2012 0640   K 3.8 06/28/2012 1115   K 4.7 05/11/2012 0640   CL 100 06/28/2012 1115   CL 99 05/11/2012 0640   CO2 28 06/28/2012 1115   CO2 20 05/11/2012 0640   BUN 10.2 06/28/2012 1115   BUN 12 05/11/2012 0640   CREATININE 0.8 06/28/2012 1115   CREATININE 0.88 05/11/2012 0640      Component Value Date/Time   CALCIUM 9.2 06/28/2012 1115   CALCIUM 9.7 05/11/2012 0640   ALKPHOS 94 06/28/2012 1115   AST 23 06/28/2012 1115   ALT 38 06/28/2012 1115   BILITOT 0.26 06/28/2012 1115     ADDITIONAL INFORMATION: CHROMOGENIC IN-SITU HYBRIDIZATION (1C - TUMOR WITH  CLIP) Interpretation HER-2/NEU BY CISH - NO AMPLIFICATION OF HER-2 DETECTED. THE RATIO OF HER-2: CEP 17 SIGNALS WAS 1.25. Reference range: Ratio: HER2:CEP17 < 1.8 - gene amplification not observed Ratio: HER2:CEP 17 1.8-2.2 - equivocal result Ratio: HER2:CEP17 > 2.2 - gene amplification observed CHROMOGENIC IN-SITU HYBRIDIZATION (1E - TUMOR WITHOUT CLIP) Interpretation: HER2/NEU BY CISH - SHOWS AMPLIFICATION BY CISH ANALYSIS. THE RATIO OF HER2: CEP 17 SIGNALS WAS 2.83. Reference range: Ratio: HER2:CEP17 < 1.8 gene amplification not observed Ratio: HER2:CEP 17 1.8-2.2 - equivocal result Ratio: HER2:CEP17 > 2.2 - gene amplification observed Heather Leisure MD Pathologist, Electronic Signature ( Signed 05/15/2012) FINAL DIAGNOSIS Diagnosis Breast, modified radical mastectomy , right, with axillary contents 1 of 4 FINAL for Heather [name], Heather [name] S ([id number]) Diagnosis(continued) TUMOR #1 (WITH CLIP): - INVASIVE LOBULAR CARCINOMA, GRADE II, SEE COMMENT. - LYMPHOVASCULAR INVASION IDENTIFIED. - PREVIOUS BIOPSY SITE IDENTIFIED. TUMOR #2 (NO CLIP): - INVASIVE LOBULAR CARCINOMA, GRADE II, SEE COMMENT. - LOBULAR CARCINOMA IN SITU. - LYMPHOVASCULAR INVASION IDENTIFIED. - THREE LYMPH NODES, POSITIVE FOR METASTATIC MAMMARY CARCINOMA (3/7). - TUMOR DEPOSITS ARE 1.7, 1.6, AND 5 CM. - EXTRACAPSULAR TUMOR EXTENSION IDENTIFIED. - TWO LYMPH NODES, POSITIVE FOR ISOLATED TUMOR CELLS. Microscopic Comment BREAST, INVASIVE TUMOR, WITH LYMPH NODE SAMPLING Specimen, including laterality: Right breast Procedure: Modified radical mastectomy Grade: Tumor 1 and tumor 2: Grade II of III Tubule formation: 3 Nuclear pleomorphism: 2 Mitotic: 1 Tumor size (gross measurement: Tumor #1- 2.3 cm; tumor #2 - 2.4 cm Margins: Invasive, distance to closest margin: Tumor #1 2.7 cm - deep margin; tumor #2 - 1.6 cm - deep margin Lymphovascular invasion: Present Ductal carcinoma in situ: Absent Grade: N/A Extensive intraductal  component: N/A Lobular neoplasia: Both atypical hyperplasia and in situ carcinoma Extent of tumor: Skin: Grossly negative Nipple: Grossly negative Skeletal muscle: Microscopically negative Lymph nodes: # examined: 7 Lymph nodes with metastasis: 3 Isolated tumor cells (< 0.2 mm): two lymph  nodes (Slide 1P and 1O) Micrometastasis: (> 0.2 mm and < 2.0 mm): none Macrometastasis: (> 2.0 mm): 1.7 cm , 1.6 cm and 5 cm Extracapsular extension: Present Breast prognostic profile: Tumor #1: Estrogen receptor: Not repeated, previous study demonstrated 98% positivity ([id number]) Progesterone receptor: Not repeated, previous study demonstrated 0% positivity ([id number]) Her 2 neu: Repeated, previous study demonstrated no amplification (1.38) ([id number]) Ki-67: Not repeated, previous study demonstrated 5% proliferation rate ([id number]) Tumor #2: Estrogen receptor: Not repeated, previous study demonstrated 97% positivity ([id number]) Progesterone receptor: Not repeated, previous study demonstrated 0% positivity ([id number]) Her 2 neu: Repeated, previous study demonstrated amplification (3.65) ([id number]) Ki-67: Not repeated, previous study demonstrated 44% proliferation rate ([id number]) 2 of 4 FINAL for Heather [name], Heather [name] ([id number]) Microscopic Comment(continued) Non-neoplastic breast: Benign fibrocystic change with usual ductal hyperplasia, sclerosing adenosis, benign radial sclerosing lesion, and microcalcifications in benign ducts and lobules. TNM: mpT2, pN1a, pMX. Comments: none  RADIOGRAPHIC STUDIES:  Mr Lodema Pilot Contrast  03/23/2012  *RADIOLOGY REPORT*  Clinical Data: New diagnosis breast cancer.  Rule out metastatic disease.  MRI HEAD WITHOUT AND WITH CONTRAST  Technique:  Multiplanar, multiecho pulse sequences of the brain and surrounding structures were obtained according to standard protocol without and with intravenous contrast  Contrast: 12mL MULTIHANCE GADOBENATE  DIMEGLUMINE 529 MG/ML IV SOLN  Comparison: None.  Findings: Ventricle size is normal.  Small 4 mm hyperintensities in the cerebral white matter bilaterally do not enhance and are most likely due to chronic microvascular ischemia.  Negative for acute infarct.  Diffusion weighted imaging is normal.  Pituitary is normal in size.  Negative for intracranial hemorrhage.  Following contrast infusion, no enhancing lesions are identified.  Paranasal sinuses are clear.  Vessels at the base of the brain are patent.  IMPRESSION: Negative for metastatic disease.  No acute abnormality.   Original Report Authenticated By: Janeece Riggers, M.D.    Mr Breast Bilateral W Wo Contrast  03/12/2012  *RADIOLOGY REPORT*  Clinical Data: Recently diagnosed invasive mammary carcinoma in the 10 o'clock region of the right breast with a metastatic right axillary lymph node.  BILATERAL BREAST MRI WITH AND WITHOUT CONTRAST  Technique: Multiplanar, multisequence MR images of both breasts were obtained prior to and following the intravenous administration of 12ml of multihance.  Three dimensional images were evaluated at the independent DynaCad workstation.  Comparison:  Mammograms dated 03/05/2012, 03/01/2012, 03/01/2011 and 02/26/2010 from [facility].  Findings: There is a moderate background parenchymal enhancement pattern.  Right breast: 1.  There is a lobulated 2.2 x 1.7 x 1.8 cm mass in the posterior third of the upper outer quadrant of the right breast.  There is a post biopsy clip artifact 1 cm lateral to the mass. 2.  In the anterior third upper outer quadrant of the right breast there is patchy non mass like enhancement measuring 0.8 x 0.3 x 0.3 cm.  Left breast:  There is no abnormal enhancement the left breast.  There are enlarged right axillary lymph nodes.  The largest right axillary lymph node measures 2.4 cm and is located lateral to the pectoralis minor consistent with level I adenopathy.  There is a 1.6 cm lymph node  behind the pectoralis minor consistent with enlarged level II adenopathy.  IMPRESSION:  1.  2.2 cm mass in the posterior third of the upper outer quadrant of the right breast corresponding with the known invasive mammary carcinoma. 2.  Patchy non mass-like enhancement in the anterior third of the upper outer quadrant of  the right breast.  MR guided core biopsy is recommended. 3. Enlarged level I and II right axillary adenopathy.  RECOMMENDATION: MR guided core biopsy of the right breast.  THREE-DIMENSIONAL MR IMAGE RENDERING ON INDEPENDENT WORKSTATION:  Three-dimensional MR images were rendered by post-processing of the original MR data on an independent workstation.  The three- dimensional MR images were interpreted, and findings were reported in the accompanying complete MRI report for this study.  BI-RADS CATEGORY 4:  Suspicious abnormality - biopsy should be considered.   Original Report Authenticated By: Baird Lyons, M.D.    Mr Biopsy/wire Localization  03/20/2012  *RADIOLOGY REPORT*  Clinical Data:  Known right breast carcinoma.  Additional area of worrisome enhancement located within the anterior upper outer quadrant of the right breast.  MRI GUIDED VACUUM ASSISTED BIOPSY OF THE RIGHT BREAST WITHOUT AND WITH CONTRAST  Comparison: Previous exams.  Technique: Multiplanar, multisequence MR images of the right breast were obtained prior to and following the intravenous administration of 12 ml of Mulithance.  I met with the patient, and we discussed the procedure of MRI guided biopsy, including risks, benefits, and alternatives. Specifically, we discussed the risks of infection, bleeding, tissue injury, clip migration, and inadequate sampling.  Informed, written consent was given.  Using sterile technique, 2% Lidocaine, MRI guidance, and a 9 gauge vacuum assisted device, biopsy was performed of the area of enhancement located within the anterior portion of the upper-outer quadrant - right breast using a lateral  approach.  At the conclusion of the procedure, a  tissue marker clip was deployed into the biopsy cavity.  IMPRESSION: MRI guided biopsy of the area of enhancement located within the anterior portion of the upper-outer quadrant of the right breast as discussed above. No apparent complications.  BI-RADS CATEGORY 6:  Known biopsy-proven malignancy - appropriate action should be taken.  THREE-DIMENSIONAL MR IMAGE RENDERING ON INDEPENDENT WORKSTATION:  Three-dimensional MR images were rendered by post-processing of the original MR data on an independent workstation.  The three- dimensional MR images were interpreted, and findings were reported in the accompanying complete MRI report for this study.  The pathology associated with the right breast MR guided core biopsy demonstrated invasive mammary carcinoma and in situ mammary carcinoma.  This is concordant with imaging findings.  I have discussed findings with the patient by telephone and answered her questions.  The patient states that biopsy site is clean and dry without hematoma formation or signs of infection.  Post biopsy wound care instructions were reviewed with the patient.  Follow-up will be with Dr. Carolynne Edouard and Dr. Welton Flakes regards her treatment plan.  The patient was encouraged to call the Breast Center for additional questions or concerns   Original Report Authenticated By: Rolla Plate, M.D.    Nm Pet Image Initial (pi) Skull Base To Thigh  03/22/2012  *RADIOLOGY REPORT*  Clinical Data: Initial treatment strategy for high risk right breast cancer.  NUCLEAR MEDICINE PET SKULL BASE TO THIGH  Fasting Blood Glucose:  100  Technique:  18.6 mCi F-18 FDG was injected intravenously. CT data was obtained and used for attenuation correction and anatomic localization only.  (This was not acquired as a diagnostic CT examination.) Additional exam technical data entered on technologist worksheet.  Comparison:  MRI breast dated 03/12/2012  Findings:  Neck: No  hypermetabolic lymph nodes in the neck.  Chest:  Vague hypermetabolism in the upper/central right breast, max SUV 2.7.  Additional focal hypermetabolism in the upper/outer right breast, max SUV 2.3 (PET  image 105).  These findings correspond to biopsy-proven primary breast carcinoma.  1.3 cm short axis right subpectoral lymph node (series 2/image 66), max SUV 3.1.  Right axillary lymphadenopathy measuring up to 1.6 cm short axis (series 2/image 88), max SUV 4.8).  No hypermetabolic mediastinal or hilar nodes.  No suspicious pulmonary nodules on the CT scan.  Abdomen/Pelvis:  No abnormal hypermetabolic activity within the liver, pancreas, adrenal glands, or spleen.  No hypermetabolic lymph nodes in the abdomen or pelvis.  Skeleton:  No focal hypermetabolic activity to suggest skeletal metastasis.  IMPRESSION: Two foci of hypermetabolism in the upper right breast, max SUV 2.7, corresponding to biopsy-proven primary breast carcinoma.  Associated right subpectoral and axillary nodal metastases, max SUV 4.8.  No evidence of distant metastases.   Original Report Authenticated By: Charline Bills, M.D.    Mm Digital Diagnostic Unilat R  03/20/2012  *RADIOLOGY REPORT*  Clinical Data:  Post right breast MR guided core biopsy.  DIGITAL DIAGNOSTIC RIGHT BREAST MAMMOGRAM  Comparison:  03/05/2012, 03/01/2012, 03/01/2011, 02/26/2010 mammograms from [facility].  Findings:  Films are performed following MR guided biopsy of the anterior portion of the upper-outer quadrant of the right breast. The hourglass shaped clip placed during the MR guided core biopsy appears in appropriate position.  IMPRESSION: Appropriate positioning of clip following right breast MR guided core biopsy.   Original Report Authenticated By: Rolla Plate, M.D.     ASSESSMENT: 67 year old [place], [place] woman  #1  status post mastectomy with right axillary lymph node dissection 05/10/2012 for an mpT2 N1, stage stage IIB invasive lobular breast  cancer, stage IIB, ER positive, HER-2/neu positive, with an Mib-1 between 5% and 44%  #2 receiving adjuvant chemotherapy consisting of Taxotere, carboplatinum and Herceptin given every 21 days for total of 6 cycles of Taxotere carboplatinum and weekly Herceptin beginning on 06/14/12.  #3 Herceptin to be continued to total one year; most recent echo 03/28/2012  PLAN:   1. Ms. Gebel is feeling much better.  She will proceed with Herceptin today.  She and I discussed what she and Dr. Darnelle Catalan discussed at her last visit.  She will continue the Prilosec for indigestion and try the steroids to prevent her malaise after stopping the steroids.    2.  I will see her back next week for her next treatment.    All questions were answered. The patient knows to call the clinic with any problems, questions or concerns. We can certainly see the patient much sooner if necessary.  I spent 25 minutes counseling the patient face to face. The total time spent in the appointment was 30 minutes.  Cherie Ouch Lyn Hollingshead, NP Medical Oncology [facility] Phone: [phone]  Augustin Schooling   06/28/2012, 3:49 PM

## 2012-06-28 NOTE — Patient Instructions (Addendum)
Trastuzumab injection for infusion What is this medicine? TRASTUZUMAB (tras TOO zoo mab) is a monoclonal antibody. It targets a protein called HER2. This protein is found in some stomach and breast cancers. This medicine can stop cancer cell growth. This medicine may be used with other cancer treatments. This medicine may be used for other purposes; ask your health care provider or pharmacist if you have questions. What should I tell my health care provider before I take this medicine? They need to know if you have any of these conditions: -heart disease -heart failure -infection (especially a virus infection such as chickenpox, cold sores, or herpes) -lung or breathing disease, like asthma -recent or ongoing radiation therapy -an unusual or allergic reaction to trastuzumab, benzyl alcohol, or other medications, foods, dyes, or preservatives -pregnant or trying to get pregnant -breast-feeding How should I use this medicine? This drug is given as an infusion into a vein. It is administered in a hospital or clinic by a specially trained health care professional. Talk to your pediatrician regarding the use of this medicine in children. This medicine is not approved for use in children. Overdosage: If you think you have taken too much of this medicine contact a poison control center or emergency room at once. NOTE: This medicine is only for you. Do not share this medicine with others. What if I miss a dose? It is important not to miss a dose. Call your doctor or health care professional if you are unable to keep an appointment. What may interact with this medicine? -cyclophosphamide -doxorubicin -warfarin This list may not describe all possible interactions. Give your health care provider a list of all the medicines, herbs, non-prescription drugs, or dietary supplements you use. Also tell them if you smoke, drink alcohol, or use illegal drugs. Some items may interact with your medicine. What  should I watch for while using this medicine? Visit your doctor for checks on your progress. Report any side effects. Continue your course of treatment even though you feel ill unless your doctor tells you to stop. Call your doctor or health care professional for advice if you get a fever, chills or sore throat, or other symptoms of a cold or flu. Do not treat yourself. Try to avoid being around people who are sick. You may experience fever, chills and shaking during your first infusion. These effects are usually mild and can be treated with other medicines. Report any side effects during the infusion to your health care professional. Fever and chills usually do not happen with later infusions. What side effects may I notice from receiving this medicine? Side effects that you should report to your doctor or other health care professional as soon as possible: -breathing difficulties -chest pain or palpitations -cough -dizziness or fainting -fever or chills, sore throat -skin rash, itching or hives -swelling of the legs or ankles -unusually weak or tired Side effects that usually do not require medical attention (report to your doctor or other health care professional if they continue or are bothersome): -loss of appetite -headache -muscle aches -nausea This list may not describe all possible side effects. Call your doctor for medical advice about side effects. You may report side effects to FDA at 1-800-FDA-1088. Where should I keep my medicine? This drug is given in a hospital or clinic and will not be stored at home. NOTE: This sheet is a summary. It may not cover all possible information. If you have questions about this medicine, talk to your doctor, pharmacist,   or health care provider.  2013, [employer]. (02/27/2009 1:43:15 PM)  

## 2012-06-28 NOTE — Patient Instructions (Addendum)
Doing well. Proceed with Herceptin.  We will see you back next week for your chemotherapy.

## 2012-06-29 ENCOUNTER — Other Ambulatory Visit: Payer: Self-pay | Admitting: Certified Registered Nurse Anesthetist

## 2012-07-02 ENCOUNTER — Encounter (INDEPENDENT_AMBULATORY_CARE_PROVIDER_SITE_OTHER): Payer: Self-pay | Admitting: General Surgery

## 2012-07-02 ENCOUNTER — Ambulatory Visit (INDEPENDENT_AMBULATORY_CARE_PROVIDER_SITE_OTHER): Payer: Medicare Other | Admitting: General Surgery

## 2012-07-02 VITALS — BP 134/80 | HR 82 | Temp 97.1°F | Resp 18 | Ht 64.0 in | Wt 135.4 lb

## 2012-07-02 DIAGNOSIS — C50411 Malignant neoplasm of upper-outer quadrant of right female breast: Secondary | ICD-10-CM

## 2012-07-02 DIAGNOSIS — C50419 Malignant neoplasm of upper-outer quadrant of unspecified female breast: Secondary | ICD-10-CM

## 2012-07-02 NOTE — Progress Notes (Signed)
Subjective:     Patient ID: Heather [name], female   DOB: Jan 19, 1946, 67 y.o.   MRN: [id number]  HPI The patient is a 67 year old white female who is 2 months status post right mastectomy and axillary lymph node dissection for a T2 N1 right breast cancer. She feels good and has no complaints today. She went to physical therapy once and is able to get her arm above her head without any difficulty. She denies any pain.  Review of Systems     Objective:   Physical Exam On exam her right mastectomy incision has healed nicely. There is no sign of infection or seroma. Her skin flaps are healthy. There is no palpable mass in the left breast. There is no palpable axillary or supraclavicular cervical lymphadenopathy    Assessment:     The patient is 2 months status post right modified radical mastectomy for right breast cancer     Plan:     At this point she will continue receiving chemotherapy. She will continue to do regular self exams. We will plan to see her back in 3 months.

## 2012-07-02 NOTE — Patient Instructions (Signed)
Will fit for compression sleeve Continue physical therapy

## 2012-07-03 ENCOUNTER — Telehealth: Payer: Self-pay | Admitting: Oncology

## 2012-07-03 NOTE — Telephone Encounter (Signed)
Faxed pt medical records to [facility] [employer]

## 2012-07-05 ENCOUNTER — Encounter: Payer: Self-pay | Admitting: Adult Health

## 2012-07-05 ENCOUNTER — Ambulatory Visit (HOSPITAL_BASED_OUTPATIENT_CLINIC_OR_DEPARTMENT_OTHER): Payer: Medicare Other

## 2012-07-05 ENCOUNTER — Other Ambulatory Visit (HOSPITAL_BASED_OUTPATIENT_CLINIC_OR_DEPARTMENT_OTHER): Payer: Medicare Other | Admitting: Lab

## 2012-07-05 ENCOUNTER — Ambulatory Visit (HOSPITAL_BASED_OUTPATIENT_CLINIC_OR_DEPARTMENT_OTHER): Payer: Medicare Other | Admitting: Adult Health

## 2012-07-05 VITALS — BP 134/84 | HR 81 | Temp 97.3°F | Resp 20 | Ht 64.0 in | Wt 140.7 lb

## 2012-07-05 DIAGNOSIS — Z5111 Encounter for antineoplastic chemotherapy: Secondary | ICD-10-CM

## 2012-07-05 DIAGNOSIS — C50419 Malignant neoplasm of upper-outer quadrant of unspecified female breast: Secondary | ICD-10-CM

## 2012-07-05 DIAGNOSIS — Z5112 Encounter for antineoplastic immunotherapy: Secondary | ICD-10-CM

## 2012-07-05 DIAGNOSIS — C50411 Malignant neoplasm of upper-outer quadrant of right female breast: Secondary | ICD-10-CM

## 2012-07-05 DIAGNOSIS — C773 Secondary and unspecified malignant neoplasm of axilla and upper limb lymph nodes: Secondary | ICD-10-CM

## 2012-07-05 DIAGNOSIS — Z170 Estrogen receptor positive status [ER+]: Secondary | ICD-10-CM

## 2012-07-05 LAB — CBC WITH DIFFERENTIAL/PLATELET
BASO%: 0.1 % (ref 0.0–2.0)
Basophils Absolute: 0 10*3/uL (ref 0.0–0.1)
EOS%: 0 % (ref 0.0–7.0)
Eosinophils Absolute: 0 10*3/uL (ref 0.0–0.5)
HCT: 36.2 % (ref 34.8–46.6)
HGB: 12 g/dL (ref 11.6–15.9)
LYMPH%: 8.3 % — ABNORMAL LOW (ref 14.0–49.7)
MCH: 28.7 pg (ref 25.1–34.0)
MCHC: 33.1 g/dL (ref 31.5–36.0)
MCV: 86.6 fL (ref 79.5–101.0)
MONO#: 1 10*3/uL — ABNORMAL HIGH (ref 0.1–0.9)
MONO%: 9.3 % (ref 0.0–14.0)
NEUT#: 8.6 10*3/uL — ABNORMAL HIGH (ref 1.5–6.5)
NEUT%: 82.3 % — ABNORMAL HIGH (ref 38.4–76.8)
Platelets: 258 10*3/uL (ref 145–400)
RBC: 4.18 10*6/uL (ref 3.70–5.45)
RDW: 15.6 % — ABNORMAL HIGH (ref 11.2–14.5)
WBC: 10.4 10*3/uL — ABNORMAL HIGH (ref 3.9–10.3)
lymph#: 0.9 10*3/uL (ref 0.9–3.3)
nRBC: 0 % (ref 0–0)

## 2012-07-05 LAB — COMPREHENSIVE METABOLIC PANEL (CC13)
ALT: 33 U/L (ref 0–55)
AST: 19 U/L (ref 5–34)
Albumin: 3.6 g/dL (ref 3.5–5.0)
Alkaline Phosphatase: 83 U/L (ref 40–150)
BUN: 22.8 mg/dL (ref 7.0–26.0)
CO2: 26 mEq/L (ref 22–29)
Calcium: 9.7 mg/dL (ref 8.4–10.4)
Chloride: 101 mEq/L (ref 98–107)
Creatinine: 0.9 mg/dL (ref 0.6–1.1)
Glucose: 157 mg/dl — ABNORMAL HIGH (ref 70–99)
Potassium: 3.9 mEq/L (ref 3.5–5.1)
Sodium: 137 mEq/L (ref 136–145)
Total Bilirubin: 0.35 mg/dL (ref 0.20–1.20)
Total Protein: 7.1 g/dL (ref 6.4–8.3)

## 2012-07-05 MED ORDER — LORAZEPAM 2 MG/ML IJ SOLN: 0.5000 mg | Freq: Once | INTRAMUSCULAR | Status: DC

## 2012-07-05 MED ORDER — ACETAMINOPHEN 325 MG PO TABS
650.0000 mg | ORAL_TABLET | Freq: Once | ORAL | Status: AC
  Administered 2012-07-05: 650 mg via ORAL

## 2012-07-05 MED ORDER — SODIUM CHLORIDE 0.9 % IV SOLN
570.0000 mg | Freq: Once | INTRAVENOUS | Status: AC
  Administered 2012-07-05: 570 mg via INTRAVENOUS
  Filled 2012-07-05: qty 57

## 2012-07-05 MED ORDER — HEPARIN SOD (PORK) LOCK FLUSH 100 UNIT/ML IV SOLN
500.0000 [IU] | Freq: Once | INTRAVENOUS | Status: AC | PRN
  Administered 2012-07-05: 500 [IU]
  Filled 2012-07-05: qty 5

## 2012-07-05 MED ORDER — SODIUM CHLORIDE 0.9 % IJ SOLN
10.0000 mL | INTRAMUSCULAR | Status: DC | PRN
  Administered 2012-07-05: 10 mL
  Filled 2012-07-05: qty 10

## 2012-07-05 MED ORDER — DEXAMETHASONE 4 MG PO TABS: 8.0000 mg | ORAL_TABLET | Freq: Two times a day (BID) | ORAL | Status: DC

## 2012-07-05 MED ORDER — TRASTUZUMAB CHEMO INJECTION 440 MG
2.0000 mg/kg | Freq: Once | INTRAVENOUS | Status: AC
  Administered 2012-07-05: 126 mg via INTRAVENOUS
  Filled 2012-07-05: qty 6

## 2012-07-05 MED ORDER — SODIUM CHLORIDE 0.9 % IV SOLN
Freq: Once | INTRAVENOUS | Status: AC
  Administered 2012-07-05: 12:00:00 via INTRAVENOUS

## 2012-07-05 MED ORDER — DIPHENHYDRAMINE HCL 25 MG PO CAPS
50.0000 mg | ORAL_CAPSULE | Freq: Once | ORAL | Status: AC
  Administered 2012-07-05: 50 mg via ORAL

## 2012-07-05 MED ORDER — DEXAMETHASONE SODIUM PHOSPHATE 4 MG/ML IJ SOLN
20.0000 mg | Freq: Once | INTRAMUSCULAR | Status: AC
  Administered 2012-07-05: 20 mg via INTRAVENOUS

## 2012-07-05 MED ORDER — ONDANSETRON 16 MG/50ML IVPB (CHCC)
16.0000 mg | Freq: Once | INTRAVENOUS | Status: AC
  Administered 2012-07-05: 16 mg via INTRAVENOUS

## 2012-07-05 MED ORDER — DOCETAXEL CHEMO INJECTION 160 MG/16ML
75.0000 mg/m2 | Freq: Once | INTRAVENOUS | Status: AC
  Administered 2012-07-05: 120 mg via INTRAVENOUS
  Filled 2012-07-05: qty 12

## 2012-07-05 NOTE — Patient Instructions (Signed)
 Cancer Center Discharge Instructions for Patients Receiving Chemotherapy  Today you received the following chemotherapy agents Taxotere, carboplatin, herceptin  To help prevent nausea and vomiting after your treatment, we encourage you to take your nausea medication   Take it as often as prescribed.   If you develop nausea and vomiting that is not controlled by your nausea medication, call the clinic. If it is after clinic hours your family physician or the after hours number for the clinic or go to the Emergency Department.   BELOW ARE SYMPTOMS THAT SHOULD BE REPORTED IMMEDIATELY:  *FEVER GREATER THAN 100.5 F  *CHILLS WITH OR WITHOUT FEVER  NAUSEA AND VOMITING THAT IS NOT CONTROLLED WITH YOUR NAUSEA MEDICATION  *UNUSUAL SHORTNESS OF BREATH  *UNUSUAL BRUISING OR BLEEDING  TENDERNESS IN MOUTH AND THROAT WITH OR WITHOUT PRESENCE OF ULCERS  *URINARY PROBLEMS  *BOWEL PROBLEMS  UNUSUAL RASH Items with * indicate a potential emergency and should be followed up as soon as possible.  If this is your first treatment one of the nurses will contact you 24 hours after your treatment. Please let the nurse know about any problems that you may have experienced. Feel free to call the clinic you have any questions or concerns. The clinic phone number is [phone].   I have been informed and understand all the instructions given to me. I know to contact the clinic, my physician, or go to the Emergency Department if any problems should occur. I do not have any questions at this time, but understand that I may call the clinic during office hours   should I have any questions or need assistance in obtaining follow up care.    __________________________________________  _____________  __________ Signature of Patient or Authorized Representative            Date                   Time    __________________________________________ Nurse's Signature

## 2012-07-05 NOTE — Patient Instructions (Addendum)
Doing well.  Proceed with chemotherapy.  Please call us if you have any questions or concerns.    

## 2012-07-05 NOTE — Progress Notes (Signed)
OFFICE PROGRESS NOTE  CC  Heather Bradford, MD [address] [place] [address] Dr. Chevis Pretty Dr. Chipper Herb  DIAGNOSIS: 67 year old female with invasive lobular breast cancer, stage IIB, receiving adjuvant chemotherapy  PRIOR THERAPY:  #1 patient was originally seen in the multidisciplinary breast clinic on 03/14/2012 for new diagnosis of invasive ductal carcinoma that was HER-2 positive node-positive, ER positive PR negative with Ki-67 of 44%  #2 on MRI patient also had an anterior breast mass that was to be biopsied to decide whether or not she should receive neoadjuvant chemotherapy or adjuvant chemotherapy. Patient's mass was biopsied and it was positive for a malignancy that was HER-2/neu negative.  #3 patient is now status post right mastectomy with axillary lymph node dissection with the final pathology revealing the largest tumor measuring 5 cm, 1.7 cm and 1.6 cm. Tumor was ER positive PR negative HER-2/neu positive with a Ki-67 of 44% 3 of 7 lymph nodes were positive for metastatic disease.  #4 patient will proceed with adjuvant chemotherapy and Herceptin. Chemotherapy will consist of Taxotere carboplatinum given every 21 days with weekly Herceptin. Total of 6 cycles of Taxotere carboplatin will be administered starting on 06/14/2012. Once patient completes her chemotherapy then she will go on to receive radiation therapy along with Herceptin with the Herceptin then being converted to every 3 weeks to finish a total of one year. Because patient's tumor is ER positive she will also be given antiestrogen therapy adjuvantly.  CURRENT THERAPY:Cycle 2 day 21 of TCH  INTERVAL HISTORY: Heather [name] is doing well today.  She is slightly tired because she had a hard time sleeping due to the steroids, hwoever she denies fevers, chills, nausea vomiting.  She is constipated and will start taking Senokot-S and Miralax as needed tonight.  Otherwise, a 10 point ROS is neg.   MEDICAL  HISTORY: Past Medical History  Diagnosis Date  . Chronic kidney disease   . Hx: UTI (urinary tract infection)   . Right shoulder pain   . Right knee pain   . Hypertension     recently started Aldactone   . Hyperlipidemia     but not on meds;diet and exercise controlled  . Dizziness     has been going on for 43months;medical MD aware  . H/O hiatal hernia   . GERD (gastroesophageal reflux disease)     Tums prn  . Hemorrhoids   . Urinary frequency     d/t taking Aldactone  . History of UTI   . Anemia     hx of  . Cataract     immature-not sure of which eye  . Anxiety     r/t updated surgery  . Insomnia     takes Melatoniin daily  . Complication of anesthesia     pt states she is sensitive to meds  . Breast cancer     right  . Skin cancer     ALLERGIES:  is allergic to codeine; keflex; naprosyn; and tussin.  MEDICATIONS:  Current Outpatient Prescriptions  Medication Sig Dispense Refill  . B Complex-C (SUPER B COMPLEX PO) Take 1 each by mouth daily.      . calcium carbonate (TUMS - DOSED IN MG ELEMENTAL CALCIUM) 500 MG chewable tablet Chew 1 tablet by mouth daily.      . Cholecalciferol (VITAMIN D-3 PO) Take 4,000 Units by mouth daily.      Marland Kitchen dexamethasone (DECADRON) 4 MG tablet Take 2 tablets (8 mg total) by mouth  2 (two) times daily with a meal. Take two times a day the day before Taxotere. Then take two times a day starting the day after chemo for 3 days.  36 tablet  5  . lidocaine-prilocaine (EMLA) cream Apply topically as needed.  30 g  7  . LORazepam (ATIVAN) 0.5 MG tablet Take 1 tablet (0.5 mg total) by mouth every 6 (six) hours as needed (Nausea or vomiting).  30 tablet  0  . magnesium gluconate (MAGONATE) 500 MG tablet Take 500 mg by mouth 2 (two) times daily.      Marland Kitchen omeprazole (PRILOSEC) 20 MG capsule Take 1 capsule (20 mg total) by mouth daily.  90 capsule  3  . ondansetron (ZOFRAN) 8 MG tablet Take 1 tablet (8 mg total) by mouth 2 (two) times daily. Take two  times a day starting the day after chemo for 3 days. Then take two times a day as needed for nausea or vomiting.  30 tablet  1  . predniSONE (DELTASONE) 5 MG tablet Take 1 tablet (5 mg total) by mouth daily.  30 tablet  2  . PREVIDENT 5000 BOOSTER PLUS 1.1 % PSTE Take 1 application by mouth Twice daily.      . Probiotic Product (PROBIOTIC DAILY PO) Take 1 each by mouth.      . prochlorperazine (COMPAZINE) 10 MG tablet Take 1 tablet (10 mg total) by mouth every 6 (six) hours as needed (Nausea or vomiting).  30 tablet  1  . prochlorperazine (COMPAZINE) 25 MG suppository Place 1 suppository (25 mg total) rectally every 12 (twelve) hours as needed for nausea.  12 suppository  3  . spironolactone (ALDACTONE) 25 MG tablet Take 0.5 tablets (12.5 mg total) by mouth daily.  30 tablet  3   No current facility-administered medications for this visit.   Facility-Administered Medications Ordered in Other Visits  Medication Dose Route Frequency Provider Last Rate Last Dose  . CARBOplatin (PARAPLATIN) 570 mg in sodium chloride 0.9 % 250 mL chemo infusion  570 mg Intravenous Once Victorino December, MD      . DOCEtaxel (TAXOTERE) 120 mg in dextrose 5 % 250 mL chemo infusion  75 mg/m2 (Treatment Plan Actual) Intravenous Once Victorino December, MD 262 mL/hr at 07/05/12 1254 120 mg at 07/05/12 1254  . heparin lock flush 100 unit/mL  500 Units Intracatheter Once PRN Victorino December, MD      . LORazepam (ATIVAN) injection 0.5 mg  0.5 mg Intravenous Once Augustin Schooling, NP      . sodium chloride 0.9 % injection 10 mL  10 mL Intracatheter PRN Victorino December, MD      . trastuzumab (HERCEPTIN) 126 mg in sodium chloride 0.9 % 250 mL chemo infusion  2 mg/kg (Treatment Plan Actual) Intravenous Once Victorino December, MD        SURGICAL HISTORY:  Past Surgical History  Procedure Laterality Date  . Cyst removed from left breast  1970  . Left wrist surgery   2004    with plate  . Colonoscopy    . Esophagogastroduodenoscopy     . Mastectomy, radical  05/11/2011    right   . Mastectomy modified radical  05/10/2012    Procedure: MASTECTOMY MODIFIED RADICAL;  Surgeon: Robyne Askew, MD;  Location: [facility] OR;  Service: General;  Laterality: Right;  RIGHT MODIFIED RADICAL MASTECTOMY  . Portacath placement  05/10/2012    Procedure: INSERTION PORT-A-CATH;  Surgeon: Robyne Askew, MD;  Location: [facility] OR;  Service: General;  Laterality: Left;  . Wrist fracture surgery  2004    REVIEW OF SYSTEMS:  General: fatigue (-), night sweats (-), fever (-), pain (-) Lymph: palpable nodes (-) HEENT: vision changes (-), mucositis (-), gum bleeding (-), epistaxis (-) Cardiovascular: chest pain (-), palpitations (-) Pulmonary: shortness of breath (-), dyspnea on exertion (-), cough (-), hemoptysis (-) GI:  Early satiety (-), melena (-), dysphagia (-), nausea/vomiting (-), diarrhea (-) GU: dysuria (-), hematuria (-), incontinence (-) Musculoskeletal: joint swelling (-), joint pain (-), back pain (-) Neuro: weakness (-), numbness (-), headache (-), confusion (-) Skin: Rash (-), lesions (-), dryness (-) Psych: depression (-), suicidal/homicidal ideation (-), feeling of hopelessness (-)    PHYSICAL EXAMINATION: Blood pressure 134/84, pulse 81, temperature 97.3 F (36.3 C), temperature source Oral, resp. rate 20, height 5\' 4"  (1.626 m), weight 140 lb 11.2 oz (63.821 kg). Body mass index is 24.14 kg/(m^2). General: Patient is a well appearing female in no acute distress HEENT: PERRLA, sclerae anicteric no conjunctival pallor, MMM Neck: supple, no palpable adenopathy Lungs: clear to auscultation bilaterally, no wheezes, rhonchi, or rales Cardiovascular: regular rate rhythm, S1, S2, no murmurs, rubs or gallops Abdomen: Soft, non-tender, non-distended, normoactive bowel sounds, no HSM Extremities: warm and well perfused, no clubbing, cyanosis, or edema Skin: No rashes or lesions Neuro: Non-focal Breasts: The right breast is status post  mastectomy. There is no evidence of local recurrence. The right axilla is benign. The left breast is unremarkable. ECOG PERFORMANCE STATUS: 1   LABORATORY DATA: Lab Results  Component Value Date   WBC 10.4* 07/05/2012   HGB 12.0 07/05/2012   HCT 36.2 07/05/2012   MCV 86.6 07/05/2012   PLT 258 07/05/2012      Chemistry      Component Value Date/Time   NA 137 07/05/2012 1055   NA 138 05/11/2012 0640   K 3.9 07/05/2012 1055   K 4.7 05/11/2012 0640   CL 101 07/05/2012 1055   CL 99 05/11/2012 0640   CO2 26 07/05/2012 1055   CO2 20 05/11/2012 0640   BUN 22.8 07/05/2012 1055   BUN 12 05/11/2012 0640   CREATININE 0.9 07/05/2012 1055   CREATININE 0.88 05/11/2012 0640      Component Value Date/Time   CALCIUM 9.7 07/05/2012 1055   CALCIUM 9.7 05/11/2012 0640   ALKPHOS 83 07/05/2012 1055   AST 19 07/05/2012 1055   ALT 33 07/05/2012 1055   BILITOT 0.35 07/05/2012 1055     ADDITIONAL INFORMATION: CHROMOGENIC IN-SITU HYBRIDIZATION (1C - TUMOR WITH CLIP) Interpretation HER-2/NEU BY CISH - NO AMPLIFICATION OF HER-2 DETECTED. THE RATIO OF HER-2: CEP 17 SIGNALS WAS 1.25. Reference range: Ratio: HER2:CEP17 < 1.8 - gene amplification not observed Ratio: HER2:CEP 17 1.8-2.2 - equivocal result Ratio: HER2:CEP17 > 2.2 - gene amplification observed CHROMOGENIC IN-SITU HYBRIDIZATION (1E - TUMOR WITHOUT CLIP) Interpretation: HER2/NEU BY CISH - SHOWS AMPLIFICATION BY CISH ANALYSIS. THE RATIO OF HER2: CEP 17 SIGNALS WAS 2.83. Reference range: Ratio: HER2:CEP17 < 1.8 gene amplification not observed Ratio: HER2:CEP 17 1.8-2.2 - equivocal result Ratio: HER2:CEP17 > 2.2 - gene amplification observed Pecola Leisure MD Pathologist, Electronic Signature ( Signed 05/15/2012) FINAL DIAGNOSIS Diagnosis Breast, modified radical mastectomy , right, with axillary contents 1 of 4 FINAL for TAMAR, MIANO S ([id number]) Diagnosis(continued) TUMOR #1 (WITH CLIP): - INVASIVE LOBULAR CARCINOMA, GRADE II, SEE COMMENT. - LYMPHOVASCULAR  INVASION IDENTIFIED. - PREVIOUS BIOPSY SITE IDENTIFIED. TUMOR #2 (NO CLIP): - INVASIVE LOBULAR CARCINOMA,  GRADE II, SEE COMMENT. - LOBULAR CARCINOMA IN SITU. - LYMPHOVASCULAR INVASION IDENTIFIED. - THREE LYMPH NODES, POSITIVE FOR METASTATIC MAMMARY CARCINOMA (3/7). - TUMOR DEPOSITS ARE 1.7, 1.6, AND 5 CM. - EXTRACAPSULAR TUMOR EXTENSION IDENTIFIED. - TWO LYMPH NODES, POSITIVE FOR ISOLATED TUMOR CELLS. Microscopic Comment BREAST, INVASIVE TUMOR, WITH LYMPH NODE SAMPLING Specimen, including laterality: Right breast Procedure: Modified radical mastectomy Grade: Tumor 1 and tumor 2: Grade II of III Tubule formation: 3 Nuclear pleomorphism: 2 Mitotic: 1 Tumor size (gross measurement: Tumor #1- 2.3 cm; tumor #2 - 2.4 cm Margins: Invasive, distance to closest margin: Tumor #1 2.7 cm - deep margin; tumor #2 - 1.6 cm - deep margin Lymphovascular invasion: Present Ductal carcinoma in situ: Absent Grade: N/A Extensive intraductal component: N/A Lobular neoplasia: Both atypical hyperplasia and in situ carcinoma Extent of tumor: Skin: Grossly negative Nipple: Grossly negative Skeletal muscle: Microscopically negative Lymph nodes: # examined: 7 Lymph nodes with metastasis: 3 Isolated tumor cells (< 0.2 mm): two lymph nodes (Slide 1P and 1O) Micrometastasis: (> 0.2 mm and < 2.0 mm): none Macrometastasis: (> 2.0 mm): 1.7 cm , 1.6 cm and 5 cm Extracapsular extension: Present Breast prognostic profile: Tumor #1: Estrogen receptor: Not repeated, previous study demonstrated 98% positivity ([id number]) Progesterone receptor: Not repeated, previous study demonstrated 0% positivity ([id number]) Her 2 neu: Repeated, previous study demonstrated no amplification (1.38) ([id number]) Ki-67: Not repeated, previous study demonstrated 5% proliferation rate ([id number]) Tumor #2: Estrogen receptor: Not repeated, previous study demonstrated 97% positivity ([id number]) Progesterone receptor: Not  repeated, previous study demonstrated 0% positivity ([id number]) Her 2 neu: Repeated, previous study demonstrated amplification (3.65) ([id number]) Ki-67: Not repeated, previous study demonstrated 44% proliferation rate ([id number]) 2 of 4 FINAL for Heather [name], TAGLE ([id number]) Microscopic Comment(continued) Non-neoplastic breast: Benign fibrocystic change with usual ductal hyperplasia, sclerosing adenosis, benign radial sclerosing lesion, and microcalcifications in benign ducts and lobules. TNM: mpT2, pN1a, pMX. Comments: none  RADIOGRAPHIC STUDIES:  Mr Lodema Pilot Contrast  03/23/2012  *RADIOLOGY REPORT*  Clinical Data: New diagnosis breast cancer.  Rule out metastatic disease.  MRI HEAD WITHOUT AND WITH CONTRAST  Technique:  Multiplanar, multiecho pulse sequences of the brain and surrounding structures were obtained according to standard protocol without and with intravenous contrast  Contrast: 12mL MULTIHANCE GADOBENATE DIMEGLUMINE 529 MG/ML IV SOLN  Comparison: None.  Findings: Ventricle size is normal.  Small 4 mm hyperintensities in the cerebral white matter bilaterally do not enhance and are most likely due to chronic microvascular ischemia.  Negative for acute infarct.  Diffusion weighted imaging is normal.  Pituitary is normal in size.  Negative for intracranial hemorrhage.  Following contrast infusion, no enhancing lesions are identified.  Paranasal sinuses are clear.  Vessels at the base of the brain are patent.  IMPRESSION: Negative for metastatic disease.  No acute abnormality.   Original Report Authenticated By: Janeece Riggers, M.D.    Mr Breast Bilateral W Wo Contrast  03/12/2012  *RADIOLOGY REPORT*  Clinical Data: Recently diagnosed invasive mammary carcinoma in the 10 o'clock region of the right breast with a metastatic right axillary lymph node.  BILATERAL BREAST MRI WITH AND WITHOUT CONTRAST  Technique: Multiplanar, multisequence MR images of both breasts were obtained prior to and  following the intravenous administration of 12ml of multihance.  Three dimensional images were evaluated at the independent DynaCad workstation.  Comparison:  Mammograms dated 03/05/2012, 03/01/2012, 03/01/2011 and 02/26/2010 from [facility].  Findings: There is a moderate background parenchymal enhancement pattern.  Right breast: 1.  There is a lobulated 2.2 x 1.7 x 1.8 cm mass in the posterior third of the upper outer quadrant of the right breast.  There is a post biopsy clip artifact 1 cm lateral to the mass. 2.  In the anterior third upper outer quadrant of the right breast there is patchy non mass like enhancement measuring 0.8 x 0.3 x 0.3 cm.  Left breast:  There is no abnormal enhancement the left breast.  There are enlarged right axillary lymph nodes.  The largest right axillary lymph node measures 2.4 cm and is located lateral to the pectoralis minor consistent with level I adenopathy.  There is a 1.6 cm lymph node behind the pectoralis minor consistent with enlarged level II adenopathy.  IMPRESSION:  1.  2.2 cm mass in the posterior third of the upper outer quadrant of the right breast corresponding with the known invasive mammary carcinoma. 2.  Patchy non mass-like enhancement in the anterior third of the upper outer quadrant of the right breast.  MR guided core biopsy is recommended. 3. Enlarged level I and II right axillary adenopathy.  RECOMMENDATION: MR guided core biopsy of the right breast.  THREE-DIMENSIONAL MR IMAGE RENDERING ON INDEPENDENT WORKSTATION:  Three-dimensional MR images were rendered by post-processing of the original MR data on an independent workstation.  The three- dimensional MR images were interpreted, and findings were reported in the accompanying complete MRI report for this study.  BI-RADS CATEGORY 4:  Suspicious abnormality - biopsy should be considered.   Original Report Authenticated By: Baird Lyons, M.D.    Mr Biopsy/wire Localization  03/20/2012  *RADIOLOGY  REPORT*  Clinical Data:  Known right breast carcinoma.  Additional area of worrisome enhancement located within the anterior upper outer quadrant of the right breast.  MRI GUIDED VACUUM ASSISTED BIOPSY OF THE RIGHT BREAST WITHOUT AND WITH CONTRAST  Comparison: Previous exams.  Technique: Multiplanar, multisequence MR images of the right breast were obtained prior to and following the intravenous administration of 12 ml of Mulithance.  I met with the patient, and we discussed the procedure of MRI guided biopsy, including risks, benefits, and alternatives. Specifically, we discussed the risks of infection, bleeding, tissue injury, clip migration, and inadequate sampling.  Informed, written consent was given.  Using sterile technique, 2% Lidocaine, MRI guidance, and a 9 gauge vacuum assisted device, biopsy was performed of the area of enhancement located within the anterior portion of the upper-outer quadrant - right breast using a lateral approach.  At the conclusion of the procedure, a  tissue marker clip was deployed into the biopsy cavity.  IMPRESSION: MRI guided biopsy of the area of enhancement located within the anterior portion of the upper-outer quadrant of the right breast as discussed above. No apparent complications.  BI-RADS CATEGORY 6:  Known biopsy-proven malignancy - appropriate action should be taken.  THREE-DIMENSIONAL MR IMAGE RENDERING ON INDEPENDENT WORKSTATION:  Three-dimensional MR images were rendered by post-processing of the original MR data on an independent workstation.  The three- dimensional MR images were interpreted, and findings were reported in the accompanying complete MRI report for this study.  The pathology associated with the right breast MR guided core biopsy demonstrated invasive mammary carcinoma and in situ mammary carcinoma.  This is concordant with imaging findings.  I have discussed findings with the patient by telephone and answered her questions.  The patient states that  biopsy site is clean and dry without hematoma formation or signs of infection.  Post biopsy wound care instructions were  reviewed with the patient.  Follow-up will be with Dr. Carolynne Edouard and Dr. Welton Flakes regards her treatment plan.  The patient was encouraged to call the Breast Center for additional questions or concerns   Original Report Authenticated By: Rolla Plate, M.D.    Nm Pet Image Initial (pi) Skull Base To Thigh  03/22/2012  *RADIOLOGY REPORT*  Clinical Data: Initial treatment strategy for high risk right breast cancer.  NUCLEAR MEDICINE PET SKULL BASE TO THIGH  Fasting Blood Glucose:  100  Technique:  18.6 mCi F-18 FDG was injected intravenously. CT data was obtained and used for attenuation correction and anatomic localization only.  (This was not acquired as a diagnostic CT examination.) Additional exam technical data entered on technologist worksheet.  Comparison:  MRI breast dated 03/12/2012  Findings:  Neck: No hypermetabolic lymph nodes in the neck.  Chest:  Vague hypermetabolism in the upper/central right breast, max SUV 2.7.  Additional focal hypermetabolism in the upper/outer right breast, max SUV 2.3 (PET image 105).  These findings correspond to biopsy-proven primary breast carcinoma.  1.3 cm short axis right subpectoral lymph node (series 2/image 66), max SUV 3.1.  Right axillary lymphadenopathy measuring up to 1.6 cm short axis (series 2/image 88), max SUV 4.8).  No hypermetabolic mediastinal or hilar nodes.  No suspicious pulmonary nodules on the CT scan.  Abdomen/Pelvis:  No abnormal hypermetabolic activity within the liver, pancreas, adrenal glands, or spleen.  No hypermetabolic lymph nodes in the abdomen or pelvis.  Skeleton:  No focal hypermetabolic activity to suggest skeletal metastasis.  IMPRESSION: Two foci of hypermetabolism in the upper right breast, max SUV 2.7, corresponding to biopsy-proven primary breast carcinoma.  Associated right subpectoral and axillary nodal metastases, max  SUV 4.8.  No evidence of distant metastases.   Original Report Authenticated By: Charline Bills, M.D.    Mm Digital Diagnostic Unilat R  03/20/2012  *RADIOLOGY REPORT*  Clinical Data:  Post right breast MR guided core biopsy.  DIGITAL DIAGNOSTIC RIGHT BREAST MAMMOGRAM  Comparison:  03/05/2012, 03/01/2012, 03/01/2011, 02/26/2010 mammograms from [facility].  Findings:  Films are performed following MR guided biopsy of the anterior portion of the upper-outer quadrant of the right breast. The hourglass shaped clip placed during the MR guided core biopsy appears in appropriate position.  IMPRESSION: Appropriate positioning of clip following right breast MR guided core biopsy.   Original Report Authenticated By: Rolla Plate, M.D.     ASSESSMENT: 67 year old [place], [place] woman  #1  status post mastectomy with right axillary lymph node dissection 05/10/2012 for an mpT2 N1, stage stage IIB invasive lobular breast cancer, stage IIB, ER positive, HER-2/neu positive, with an Mib-1 between 5% and 44%  #2 receiving adjuvant chemotherapy consisting of Taxotere, carboplatinum and Herceptin given every 21 days for total of 6 cycles of Taxotere carboplatinum and weekly Herceptin beginning on 06/14/12.  #3 Herceptin to be continued to total one year; most recent echo 03/28/2012  PLAN:   1. Ms. Padmore is doing well.  She will proceed with chemotherapy today.  She will take the Prednisone Dr. Darnelle Catalan prescribed after she finishes her dexamethasone to prevent the crash feeling she had last time.   2.  I will see her back next week for Herceptin.  All questions were answered. The patient knows to call the clinic with any problems, questions or concerns. We can certainly see the patient much sooner if necessary.   I spent 25 minutes counseling the patient face to face. The total time spent in the  appointment was 30 minutes.  Cherie Ouch Lyn Hollingshead, NP Medical Oncology [facility] Phone: [phone]  Augustin Schooling   07/05/2012, 1:12 PM

## 2012-07-06 ENCOUNTER — Other Ambulatory Visit: Payer: Self-pay | Admitting: Certified Registered Nurse Anesthetist

## 2012-07-06 ENCOUNTER — Ambulatory Visit (HOSPITAL_BASED_OUTPATIENT_CLINIC_OR_DEPARTMENT_OTHER): Payer: Medicare Other

## 2012-07-06 VITALS — BP 143/71 | HR 74 | Temp 98.1°F

## 2012-07-06 DIAGNOSIS — C773 Secondary and unspecified malignant neoplasm of axilla and upper limb lymph nodes: Secondary | ICD-10-CM

## 2012-07-06 DIAGNOSIS — C50419 Malignant neoplasm of upper-outer quadrant of unspecified female breast: Secondary | ICD-10-CM

## 2012-07-06 MED ORDER — PEGFILGRASTIM INJECTION 6 MG/0.6ML
6.0000 mg | Freq: Once | SUBCUTANEOUS | Status: AC
  Administered 2012-07-06: 6 mg via SUBCUTANEOUS
  Filled 2012-07-06: qty 0.6

## 2012-07-12 ENCOUNTER — Ambulatory Visit (HOSPITAL_BASED_OUTPATIENT_CLINIC_OR_DEPARTMENT_OTHER): Payer: Medicare Other | Admitting: Adult Health

## 2012-07-12 ENCOUNTER — Other Ambulatory Visit (HOSPITAL_BASED_OUTPATIENT_CLINIC_OR_DEPARTMENT_OTHER): Payer: Medicare Other | Admitting: Lab

## 2012-07-12 ENCOUNTER — Other Ambulatory Visit: Payer: Medicare Other | Admitting: Lab

## 2012-07-12 ENCOUNTER — Ambulatory Visit (HOSPITAL_BASED_OUTPATIENT_CLINIC_OR_DEPARTMENT_OTHER): Payer: Medicare Other

## 2012-07-12 ENCOUNTER — Encounter: Payer: Self-pay | Admitting: Adult Health

## 2012-07-12 VITALS — BP 129/84 | HR 94 | Temp 98.6°F | Resp 20 | Ht 64.0 in | Wt 137.2 lb

## 2012-07-12 DIAGNOSIS — R209 Unspecified disturbances of skin sensation: Secondary | ICD-10-CM

## 2012-07-12 DIAGNOSIS — D709 Neutropenia, unspecified: Secondary | ICD-10-CM

## 2012-07-12 DIAGNOSIS — Z170 Estrogen receptor positive status [ER+]: Secondary | ICD-10-CM

## 2012-07-12 DIAGNOSIS — C773 Secondary and unspecified malignant neoplasm of axilla and upper limb lymph nodes: Secondary | ICD-10-CM

## 2012-07-12 DIAGNOSIS — E860 Dehydration: Secondary | ICD-10-CM

## 2012-07-12 DIAGNOSIS — C50411 Malignant neoplasm of upper-outer quadrant of right female breast: Secondary | ICD-10-CM

## 2012-07-12 DIAGNOSIS — Z5112 Encounter for antineoplastic immunotherapy: Secondary | ICD-10-CM

## 2012-07-12 DIAGNOSIS — C50419 Malignant neoplasm of upper-outer quadrant of unspecified female breast: Secondary | ICD-10-CM

## 2012-07-12 LAB — CBC WITH DIFFERENTIAL/PLATELET
BASO%: 0.7 % (ref 0.0–2.0)
Basophils Absolute: 0 10*3/uL (ref 0.0–0.1)
EOS%: 0.3 % (ref 0.0–7.0)
Eosinophils Absolute: 0 10*3/uL (ref 0.0–0.5)
HCT: 38.3 % (ref 34.8–46.6)
HGB: 12.7 g/dL (ref 11.6–15.9)
LYMPH%: 43.9 % (ref 14.0–49.7)
MCH: 28.9 pg (ref 25.1–34.0)
MCHC: 33.2 g/dL (ref 31.5–36.0)
MCV: 87 fL (ref 79.5–101.0)
MONO#: 0.9 10*3/uL (ref 0.1–0.9)
MONO%: 31.5 % — ABNORMAL HIGH (ref 0.0–14.0)
NEUT#: 0.7 10*3/uL — ABNORMAL LOW (ref 1.5–6.5)
NEUT%: 23.6 % — ABNORMAL LOW (ref 38.4–76.8)
Platelets: 165 10*3/uL (ref 145–400)
RBC: 4.4 10*6/uL (ref 3.70–5.45)
RDW: 15 % — ABNORMAL HIGH (ref 11.2–14.5)
WBC: 2.9 10*3/uL — ABNORMAL LOW (ref 3.9–10.3)
lymph#: 1.3 10*3/uL (ref 0.9–3.3)
nRBC: 0 % (ref 0–0)

## 2012-07-12 LAB — COMPREHENSIVE METABOLIC PANEL (CC13)
ALT: 28 U/L (ref 0–55)
AST: 18 U/L (ref 5–34)
Albumin: 2.8 g/dL — ABNORMAL LOW (ref 3.5–5.0)
Alkaline Phosphatase: 74 U/L (ref 40–150)
BUN: 15.3 mg/dL (ref 7.0–26.0)
CO2: 26 mEq/L (ref 22–29)
Calcium: 7.9 mg/dL — ABNORMAL LOW (ref 8.4–10.4)
Chloride: 102 mEq/L (ref 98–107)
Creatinine: 0.7 mg/dL (ref 0.6–1.1)
Glucose: 131 mg/dl — ABNORMAL HIGH (ref 70–99)
Potassium: 3.5 mEq/L (ref 3.5–5.1)
Sodium: 136 mEq/L (ref 136–145)
Total Bilirubin: 0.31 mg/dL (ref 0.20–1.20)
Total Protein: 5.5 g/dL — ABNORMAL LOW (ref 6.4–8.3)

## 2012-07-12 MED ORDER — ACETAMINOPHEN 325 MG PO TABS
650.0000 mg | ORAL_TABLET | Freq: Once | ORAL | Status: AC
  Administered 2012-07-12: 650 mg via ORAL

## 2012-07-12 MED ORDER — TRASTUZUMAB CHEMO INJECTION 440 MG
2.0000 mg/kg | Freq: Once | INTRAVENOUS | Status: AC
  Administered 2012-07-12: 126 mg via INTRAVENOUS
  Filled 2012-07-12: qty 6

## 2012-07-12 MED ORDER — HEPARIN SOD (PORK) LOCK FLUSH 100 UNIT/ML IV SOLN
500.0000 [IU] | Freq: Once | INTRAVENOUS | Status: AC | PRN
  Administered 2012-07-12: 500 [IU]
  Filled 2012-07-12: qty 5

## 2012-07-12 MED ORDER — CIPROFLOXACIN HCL 500 MG PO TABS: 500.0000 mg | ORAL_TABLET | Freq: Two times a day (BID) | ORAL | Status: DC

## 2012-07-12 MED ORDER — SODIUM CHLORIDE 0.9 % IJ SOLN
10.0000 mL | INTRAMUSCULAR | Status: DC | PRN
  Administered 2012-07-12: 10 mL
  Filled 2012-07-12: qty 10

## 2012-07-12 MED ORDER — DIPHENHYDRAMINE HCL 25 MG PO CAPS
50.0000 mg | ORAL_CAPSULE | Freq: Once | ORAL | Status: AC
  Administered 2012-07-12: 50 mg via ORAL

## 2012-07-12 MED ORDER — SODIUM CHLORIDE 0.9 % IV SOLN: Freq: Once | INTRAVENOUS | Status: DC

## 2012-07-12 MED ORDER — SODIUM CHLORIDE 0.9 % IV SOLN
Freq: Once | INTRAVENOUS | Status: DC
  Administered 2012-07-12: 13:00:00 via INTRAVENOUS

## 2012-07-12 NOTE — Patient Instructions (Addendum)
  Patient Neutropenia Instruction Sheet  Diagnosis: Breast Cancer      Treating Physician: Drue Second, MD  Treatment: 1. Type of chemotherapy: TCH 2. Date of last treatment: 2/27  Last Blood Counts: Lab Results  Component Value Date   WBC 2.9* 07/12/2012   HGB 12.7 07/12/2012   HCT 38.3 07/12/2012   MCV 87.0 07/12/2012   PLT 165 07/12/2012   ANC 700     Prophylactic Antibiotics: Cipro 500 mg by mouth twice a day Instructions: 1. Monitor temperature and call if fever  greater than 100.5, chills, shaking chills (rigors) 2. Call Physician on-call at [phone] 3. Give him/her symptoms and list of medications that you are taking and your last blood count.

## 2012-07-12 NOTE — Progress Notes (Signed)
OFFICE PROGRESS NOTE  CC  Heather Bradford, MD [address] [place] [address] Dr. Chevis Pretty Dr. Chipper Herb  DIAGNOSIS: 67 year old female with invasive lobular breast cancer, stage IIB, receiving adjuvant chemotherapy  PRIOR THERAPY:  #1 patient was originally seen in the multidisciplinary breast clinic on 03/14/2012 for new diagnosis of invasive ductal carcinoma that was HER-2 positive node-positive, ER positive PR negative with Ki-67 of 44%  #2 on MRI patient also had an anterior breast mass that was to be biopsied to decide whether or not she should receive neoadjuvant chemotherapy or adjuvant chemotherapy. Patient'[name] mass was biopsied and it was positive for a malignancy that was HER-2/neu negative.  #3 patient is now status post right mastectomy with axillary lymph node dissection with the final pathology revealing the largest tumor measuring 5 cm, 1.7 cm and 1.6 cm. Tumor was ER positive PR negative HER-2/neu positive with a Ki-67 of 44% 3 of 7 lymph nodes were positive for metastatic disease.  #4 patient will proceed with adjuvant chemotherapy and Herceptin. Chemotherapy will consist of Taxotere carboplatinum given every 21 days with weekly Herceptin. Total of 6 cycles of Taxotere carboplatin will be administered starting on 06/14/2012. Once patient completes her chemotherapy then she will go on to receive radiation therapy along with Herceptin with the Herceptin then being converted to every 3 weeks to finish a total of one year. Because patient'[name] tumor is ER positive she will also be given antiestrogen therapy adjuvantly.  CURRENT THERAPY:Cycle 2 day 8 of TCH  INTERVAL HISTORY: Heather [name] is doing well today. She is weak and tired after the chemotherapy.  She is drinking boost and eating cornbread.  She denies fevers, chills, nausea, vomiting.  She is using Miralax and having normal formed bowel movements.  She has mild numbness in the tip of her thumb.    MEDICAL  HISTORY: Past Medical History  Diagnosis Date  . Chronic kidney disease   . Hx: UTI (urinary tract infection)   . Right shoulder pain   . Right knee pain   . Hypertension     recently started Aldactone   . Hyperlipidemia     but not on meds;diet and exercise controlled  . Dizziness     has been going on for 69months;medical MD aware  . H/O hiatal hernia   . GERD (gastroesophageal reflux disease)     Tums prn  . Hemorrhoids   . Urinary frequency     d/t taking Aldactone  . History of UTI   . Anemia     hx of  . Cataract     immature-not sure of which eye  . Anxiety     r/t updated surgery  . Insomnia     takes Melatoniin daily  . Complication of anesthesia     pt states she is sensitive to meds  . Breast cancer     right  . Skin cancer     ALLERGIES:  is allergic to codeine; keflex; naprosyn; and tussin.  MEDICATIONS:  Current Outpatient Prescriptions  Medication Sig Dispense Refill  . B Complex-C (SUPER B COMPLEX PO) Take 1 each by mouth daily.      . calcium carbonate (TUMS - DOSED IN MG ELEMENTAL CALCIUM) 500 MG chewable tablet Chew 1 tablet by mouth daily.      . Cholecalciferol (VITAMIN D-3 PO) Take 4,000 Units by mouth daily.      Marland Kitchen dexamethasone (DECADRON) 4 MG tablet Take 2 tablets (8 mg total) by  mouth 2 (two) times daily with a meal. Take two times a day the day before Taxotere. Then take two times a day starting the day after chemo for 3 days.  36 tablet  5  . lidocaine-prilocaine (EMLA) cream Apply topically as needed.  30 g  7  . LORazepam (ATIVAN) 0.5 MG tablet Take 1 tablet (0.5 mg total) by mouth every 6 (six) hours as needed (Nausea or vomiting).  30 tablet  0  . magnesium gluconate (MAGONATE) 500 MG tablet Take 500 mg by mouth 2 (two) times daily.      Marland Kitchen omeprazole (PRILOSEC) 20 MG capsule Take 1 capsule (20 mg total) by mouth daily.  90 capsule  3  . ondansetron (ZOFRAN) 8 MG tablet Take 1 tablet (8 mg total) by mouth 2 (two) times daily. Take two  times a day starting the day after chemo for 3 days. Then take two times a day as needed for nausea or vomiting.  30 tablet  1  . predniSONE (DELTASONE) 5 MG tablet Take 1 tablet (5 mg total) by mouth daily.  30 tablet  2  . PREVIDENT 5000 BOOSTER PLUS 1.1 % PSTE Take 1 application by mouth Twice daily.      . Probiotic Product (PROBIOTIC DAILY PO) Take 1 each by mouth.      . prochlorperazine (COMPAZINE) 10 MG tablet Take 1 tablet (10 mg total) by mouth every 6 (six) hours as needed (Nausea or vomiting).  30 tablet  1  . prochlorperazine (COMPAZINE) 25 MG suppository Place 1 suppository (25 mg total) rectally every 12 (twelve) hours as needed for nausea.  12 suppository  3  . spironolactone (ALDACTONE) 25 MG tablet Take 0.5 tablets (12.5 mg total) by mouth daily.  30 tablet  3  . ciprofloxacin (CIPRO) 500 MG tablet Take 1 tablet (500 mg total) by mouth 2 (two) times daily.  14 tablet  4   Current Facility-Administered Medications  Medication Dose Route Frequency Provider Last Rate Last Dose  . 0.9 %  sodium chloride infusion   Intravenous Once Augustin Schooling, NP        SURGICAL HISTORY:  Past Surgical History  Procedure Laterality Date  . Cyst removed from left breast  1970  . Left wrist surgery   2004    with plate  . Colonoscopy    . Esophagogastroduodenoscopy    . Mastectomy, radical  05/11/2011    right   . Mastectomy modified radical  05/10/2012    Procedure: MASTECTOMY MODIFIED RADICAL;  Surgeon: Robyne Askew, MD;  Location: [facility] OR;  Service: General;  Laterality: Right;  RIGHT MODIFIED RADICAL MASTECTOMY  . Portacath placement  05/10/2012    Procedure: INSERTION PORT-A-CATH;  Surgeon: Robyne Askew, MD;  Location: [facility] OR;  Service: General;  Laterality: Left;  . Wrist fracture surgery  2004    REVIEW OF SYSTEMS:  General: fatigue (-), night sweats (-), fever (-), pain (-) Lymph: palpable nodes (-) HEENT: vision changes (-), mucositis (-), gum bleeding (-), epistaxis  (-) Cardiovascular: chest pain (-), palpitations (-) Pulmonary: shortness of breath (-), dyspnea on exertion (-), cough (-), hemoptysis (-) GI:  Early satiety (-), melena (-), dysphagia (-), nausea/vomiting (-), diarrhea (-) GU: dysuria (-), hematuria (-), incontinence (-) Musculoskeletal: joint swelling (-), joint pain (-), back pain (-) Neuro: weakness (-), numbness (-), headache (-), confusion (-) Skin: Rash (-), lesions (-), dryness (-) Psych: depression (-), suicidal/homicidal ideation (-), feeling of hopelessness (-)  PHYSICAL EXAMINATION: Blood pressure 129/84, pulse 94, temperature 98.6 F (37 C), temperature source Oral, resp. rate 20, height 5\' 4"  (1.626 m), weight 137 lb 3.2 oz (62.234 kg). Body mass index is 23.54 kg/(m^2). General: Patient is a well appearing female in no acute distress HEENT: PERRLA, sclerae anicteric no conjunctival pallor, MMM Neck: supple, no palpable adenopathy Lungs: clear to auscultation bilaterally, no wheezes, rhonchi, or rales Cardiovascular: regular rate rhythm, S1, S2, no murmurs, rubs or gallops Abdomen: Soft, non-tender, non-distended, normoactive bowel sounds, no HSM Extremities: warm and well perfused, no clubbing, cyanosis, or edema Skin: No rashes or lesions Neuro: Non-focal Breasts: The right breast is status post mastectomy. There is no evidence of local recurrence. The right axilla is benign. The left breast is unremarkable. ECOG PERFORMANCE STATUS: 1   LABORATORY DATA: Lab Results  Component Value Date   WBC 2.9* 07/12/2012   HGB 12.7 07/12/2012   HCT 38.3 07/12/2012   MCV 87.0 07/12/2012   PLT 165 07/12/2012      Chemistry      Component Value Date/Time   NA 137 07/05/2012 1055   NA 138 05/11/2012 0640   K 3.9 07/05/2012 1055   K 4.7 05/11/2012 0640   CL 101 07/05/2012 1055   CL 99 05/11/2012 0640   CO2 26 07/05/2012 1055   CO2 20 05/11/2012 0640   BUN 22.8 07/05/2012 1055   BUN 12 05/11/2012 0640   CREATININE 0.9 07/05/2012 1055    CREATININE 0.88 05/11/2012 0640      Component Value Date/Time   CALCIUM 9.7 07/05/2012 1055   CALCIUM 9.7 05/11/2012 0640   ALKPHOS 83 07/05/2012 1055   AST 19 07/05/2012 1055   ALT 33 07/05/2012 1055   BILITOT 0.35 07/05/2012 1055     ADDITIONAL INFORMATION: CHROMOGENIC IN-SITU HYBRIDIZATION (1C - TUMOR WITH CLIP) Interpretation HER-2/NEU BY CISH - NO AMPLIFICATION OF HER-2 DETECTED. THE RATIO OF HER-2: CEP 17 SIGNALS WAS 1.25. Reference range: Ratio: HER2:CEP17 < 1.8 - gene amplification not observed Ratio: HER2:CEP 17 1.8-2.2 - equivocal result Ratio: HER2:CEP17 > 2.2 - gene amplification observed CHROMOGENIC IN-SITU HYBRIDIZATION (1E - TUMOR WITHOUT CLIP) Interpretation: HER2/NEU BY CISH - SHOWS AMPLIFICATION BY CISH ANALYSIS. THE RATIO OF HER2: CEP 17 SIGNALS WAS 2.83. Reference range: Ratio: HER2:CEP17 < 1.8 gene amplification not observed Ratio: HER2:CEP 17 1.8-2.2 - equivocal result Ratio: HER2:CEP17 > 2.2 - gene amplification observed Pecola Leisure MD Pathologist, Electronic Signature ( Signed 05/15/2012) FINAL DIAGNOSIS Diagnosis Breast, modified radical mastectomy , right, with axillary contents 1 of 4 FINAL for Heather [name], Heather [name] ([id number]) Diagnosis(continued) TUMOR #1 (WITH CLIP): - INVASIVE LOBULAR CARCINOMA, GRADE II, SEE COMMENT. - LYMPHOVASCULAR INVASION IDENTIFIED. - PREVIOUS BIOPSY SITE IDENTIFIED. TUMOR #2 (NO CLIP): - INVASIVE LOBULAR CARCINOMA, GRADE II, SEE COMMENT. - LOBULAR CARCINOMA IN SITU. - LYMPHOVASCULAR INVASION IDENTIFIED. - THREE LYMPH NODES, POSITIVE FOR METASTATIC MAMMARY CARCINOMA (3/7). - TUMOR DEPOSITS ARE 1.7, 1.6, AND 5 CM. - EXTRACAPSULAR TUMOR EXTENSION IDENTIFIED. - TWO LYMPH NODES, POSITIVE FOR ISOLATED TUMOR CELLS. Microscopic Comment BREAST, INVASIVE TUMOR, WITH LYMPH NODE SAMPLING Specimen, including laterality: Right breast Procedure: Modified radical mastectomy Grade: Tumor 1 and tumor 2: Grade II of III Tubule formation:  3 Nuclear pleomorphism: 2 Mitotic: 1 Tumor size (gross measurement: Tumor #1- 2.3 cm; tumor #2 - 2.4 cm Margins: Invasive, distance to closest margin: Tumor #1 2.7 cm - deep margin; tumor #2 - 1.6 cm - deep margin Lymphovascular invasion: Present Ductal carcinoma in situ: Absent Grade: N/A  Extensive intraductal component: N/A Lobular neoplasia: Both atypical hyperplasia and in situ carcinoma Extent of tumor: Skin: Grossly negative Nipple: Grossly negative Skeletal muscle: Microscopically negative Lymph nodes: # examined: 7 Lymph nodes with metastasis: 3 Isolated tumor cells (< 0.2 mm): two lymph nodes (Slide 1P and 1O) Micrometastasis: (> 0.2 mm and < 2.0 mm): none Macrometastasis: (> 2.0 mm): 1.7 cm , 1.6 cm and 5 cm Extracapsular extension: Present Breast prognostic profile: Tumor #1: Estrogen receptor: Not repeated, previous study demonstrated 98% positivity ([id number]) Progesterone receptor: Not repeated, previous study demonstrated 0% positivity ([id number]) Her 2 neu: Repeated, previous study demonstrated no amplification (1.38) ([id number]) Ki-67: Not repeated, previous study demonstrated 5% proliferation rate ([id number]) Tumor #2: Estrogen receptor: Not repeated, previous study demonstrated 97% positivity ([id number]) Progesterone receptor: Not repeated, previous study demonstrated 0% positivity ([id number]) Her 2 neu: Repeated, previous study demonstrated amplification (3.65) ([id number]) Ki-67: Not repeated, previous study demonstrated 44% proliferation rate ([id number]) 2 of 4 FINAL for Heather [name], Heather [name] ([id number]) Microscopic Comment(continued) Non-neoplastic breast: Benign fibrocystic change with usual ductal hyperplasia, sclerosing adenosis, benign radial sclerosing lesion, and microcalcifications in benign ducts and lobules. TNM: mpT2, pN1a, pMX. Comments: none  RADIOGRAPHIC STUDIES:  Mr Lodema Pilot Contrast  03/23/2012  *RADIOLOGY REPORT*  Clinical  Data: New diagnosis breast cancer.  Rule out metastatic disease.  MRI HEAD WITHOUT AND WITH CONTRAST  Technique:  Multiplanar, multiecho pulse sequences of the brain and surrounding structures were obtained according to standard protocol without and with intravenous contrast  Contrast: 12mL MULTIHANCE GADOBENATE DIMEGLUMINE 529 MG/ML IV SOLN  Comparison: None.  Findings: Ventricle size is normal.  Small 4 mm hyperintensities in the cerebral white matter bilaterally do not enhance and are most likely due to chronic microvascular ischemia.  Negative for acute infarct.  Diffusion weighted imaging is normal.  Pituitary is normal in size.  Negative for intracranial hemorrhage.  Following contrast infusion, no enhancing lesions are identified.  Paranasal sinuses are clear.  Vessels at the base of the brain are patent.  IMPRESSION: Negative for metastatic disease.  No acute abnormality.   Original Report Authenticated By: Janeece Riggers, M.D.    Mr Breast Bilateral W Wo Contrast  03/12/2012  *RADIOLOGY REPORT*  Clinical Data: Recently diagnosed invasive mammary carcinoma in the 10 o'clock region of the right breast with a metastatic right axillary lymph node.  BILATERAL BREAST MRI WITH AND WITHOUT CONTRAST  Technique: Multiplanar, multisequence MR images of both breasts were obtained prior to and following the intravenous administration of 12ml of multihance.  Three dimensional images were evaluated at the independent DynaCad workstation.  Comparison:  Mammograms dated 03/05/2012, 03/01/2012, 03/01/2011 and 02/26/2010 from [facility].  Findings: There is a moderate background parenchymal enhancement pattern.  Right breast: 1.  There is a lobulated 2.2 x 1.7 x 1.8 cm mass in the posterior third of the upper outer quadrant of the right breast.  There is a post biopsy clip artifact 1 cm lateral to the mass. 2.  In the anterior third upper outer quadrant of the right breast there is patchy non mass like enhancement  measuring 0.8 x 0.3 x 0.3 cm.  Left breast:  There is no abnormal enhancement the left breast.  There are enlarged right axillary lymph nodes.  The largest right axillary lymph node measures 2.4 cm and is located lateral to the pectoralis minor consistent with level I adenopathy.  There is a 1.6 cm lymph node behind the pectoralis minor consistent with enlarged level II adenopathy.  IMPRESSION:  1.  2.2 cm mass in the posterior third of the upper outer quadrant of the right breast corresponding with the known invasive mammary carcinoma. 2.  Patchy non mass-like enhancement in the anterior third of the upper outer quadrant of the right breast.  MR guided core biopsy is recommended. 3. Enlarged level I and II right axillary adenopathy.  RECOMMENDATION: MR guided core biopsy of the right breast.  THREE-DIMENSIONAL MR IMAGE RENDERING ON INDEPENDENT WORKSTATION:  Three-dimensional MR images were rendered by post-processing of the original MR data on an independent workstation.  The three- dimensional MR images were interpreted, and findings were reported in the accompanying complete MRI report for this study.  BI-RADS CATEGORY 4:  Suspicious abnormality - biopsy should be considered.   Original Report Authenticated By: Baird Lyons, M.D.    Mr Biopsy/wire Localization  03/20/2012  *RADIOLOGY REPORT*  Clinical Data:  Known right breast carcinoma.  Additional area of worrisome enhancement located within the anterior upper outer quadrant of the right breast.  MRI GUIDED VACUUM ASSISTED BIOPSY OF THE RIGHT BREAST WITHOUT AND WITH CONTRAST  Comparison: Previous exams.  Technique: Multiplanar, multisequence MR images of the right breast were obtained prior to and following the intravenous administration of 12 ml of Mulithance.  I met with the patient, and we discussed the procedure of MRI guided biopsy, including risks, benefits, and alternatives. Specifically, we discussed the risks of infection, bleeding, tissue injury,  clip migration, and inadequate sampling.  Informed, written consent was given.  Using sterile technique, 2% Lidocaine, MRI guidance, and a 9 gauge vacuum assisted device, biopsy was performed of the area of enhancement located within the anterior portion of the upper-outer quadrant - right breast using a lateral approach.  At the conclusion of the procedure, a  tissue marker clip was deployed into the biopsy cavity.  IMPRESSION: MRI guided biopsy of the area of enhancement located within the anterior portion of the upper-outer quadrant of the right breast as discussed above. No apparent complications.  BI-RADS CATEGORY 6:  Known biopsy-proven malignancy - appropriate action should be taken.  THREE-DIMENSIONAL MR IMAGE RENDERING ON INDEPENDENT WORKSTATION:  Three-dimensional MR images were rendered by post-processing of the original MR data on an independent workstation.  The three- dimensional MR images were interpreted, and findings were reported in the accompanying complete MRI report for this study.  The pathology associated with the right breast MR guided core biopsy demonstrated invasive mammary carcinoma and in situ mammary carcinoma.  This is concordant with imaging findings.  I have discussed findings with the patient by telephone and answered her questions.  The patient states that biopsy site is clean and dry without hematoma formation or signs of infection.  Post biopsy wound care instructions were reviewed with the patient.  Follow-up will be with Dr. Carolynne Edouard and Dr. Welton Flakes regards her treatment plan.  The patient was encouraged to call the Breast Center for additional questions or concerns   Original Report Authenticated By: Rolla Plate, M.D.    Nm Pet Image Initial (pi) Skull Base To Thigh  03/22/2012  *RADIOLOGY REPORT*  Clinical Data: Initial treatment strategy for high risk right breast cancer.  NUCLEAR MEDICINE PET SKULL BASE TO THIGH  Fasting Blood Glucose:  100  Technique:  18.6 mCi F-18 FDG  was injected intravenously. CT data was obtained and used for attenuation correction and anatomic localization only.  (This was not acquired as a diagnostic CT examination.) Additional exam technical data entered on technologist worksheet.  Comparison:  MRI breast dated 03/12/2012  Findings:  Neck: No hypermetabolic lymph nodes in the neck.  Chest:  Vague hypermetabolism in the upper/central right breast, max SUV 2.7.  Additional focal hypermetabolism in the upper/outer right breast, max SUV 2.3 (PET image 105).  These findings correspond to biopsy-proven primary breast carcinoma.  1.3 cm short axis right subpectoral lymph node (series 2/image 66), max SUV 3.1.  Right axillary lymphadenopathy measuring up to 1.6 cm short axis (series 2/image 88), max SUV 4.8).  No hypermetabolic mediastinal or hilar nodes.  No suspicious pulmonary nodules on the CT scan.  Abdomen/Pelvis:  No abnormal hypermetabolic activity within the liver, pancreas, adrenal glands, or spleen.  No hypermetabolic lymph nodes in the abdomen or pelvis.  Skeleton:  No focal hypermetabolic activity to suggest skeletal metastasis.  IMPRESSION: Two foci of hypermetabolism in the upper right breast, max SUV 2.7, corresponding to biopsy-proven primary breast carcinoma.  Associated right subpectoral and axillary nodal metastases, max SUV 4.8.  No evidence of distant metastases.   Original Report Authenticated By: Charline Bills, M.D.    Mm Digital Diagnostic Unilat R  03/20/2012  *RADIOLOGY REPORT*  Clinical Data:  Post right breast MR guided core biopsy.  DIGITAL DIAGNOSTIC RIGHT BREAST MAMMOGRAM  Comparison:  03/05/2012, 03/01/2012, 03/01/2011, 02/26/2010 mammograms from [facility].  Findings:  Films are performed following MR guided biopsy of the anterior portion of the upper-outer quadrant of the right breast. The hourglass shaped clip placed during the MR guided core biopsy appears in appropriate position.  IMPRESSION: Appropriate positioning of  clip following right breast MR guided core biopsy.   Original Report Authenticated By: Rolla Plate, M.D.     ASSESSMENT: 67 year old [place], [place] woman  #1  status post mastectomy with right axillary lymph node dissection 05/10/2012 for an mpT2 N1, stage stage IIB invasive lobular breast cancer, stage IIB, ER positive, HER-2/neu positive, with an Mib-1 between 5% and 44%  #2 receiving adjuvant chemotherapy consisting of Taxotere, carboplatinum and Herceptin given every 21 days for total of 6 cycles of Taxotere carboplatinum and weekly Herceptin beginning on 06/14/12.  #3 Herceptin to be continued to total one year; most recent echo 03/28/2012  PLAN:   1. Heather [name] is doing well. She is neutropenic today.  I have reviewed neutropenic instructions with her and sent Cipro to her pharmacy for her to take BID.  She verbalized understanding on the instructions.  She will proceed with Herceptin and receive IV fluids as well.    2.  I will see her back next week for Herceptin.  3. She will continue to take Super B complex daily.  Should her numbness progress we will start Gabapentin.   All questions were answered. The patient knows to call the clinic with any problems, questions or concerns. We can certainly see the patient much sooner if necessary.   I spent 25 minutes counseling the patient face to face. The total time spent in the appointment was 30 minutes.  Cherie Ouch Lyn Hollingshead, NP Medical Oncology [facility] Phone: [phone]  Augustin Schooling   07/12/2012, 11:39 AM

## 2012-07-19 ENCOUNTER — Ambulatory Visit (HOSPITAL_BASED_OUTPATIENT_CLINIC_OR_DEPARTMENT_OTHER): Payer: Medicare Other | Admitting: Adult Health

## 2012-07-19 ENCOUNTER — Ambulatory Visit (HOSPITAL_BASED_OUTPATIENT_CLINIC_OR_DEPARTMENT_OTHER): Payer: Medicare Other

## 2012-07-19 ENCOUNTER — Telehealth: Payer: Self-pay | Admitting: Oncology

## 2012-07-19 ENCOUNTER — Other Ambulatory Visit: Payer: Medicare Other | Admitting: Lab

## 2012-07-19 ENCOUNTER — Encounter: Payer: Self-pay | Admitting: Adult Health

## 2012-07-19 ENCOUNTER — Other Ambulatory Visit (HOSPITAL_BASED_OUTPATIENT_CLINIC_OR_DEPARTMENT_OTHER): Payer: Medicare Other | Admitting: Lab

## 2012-07-19 VITALS — BP 144/85 | HR 77 | Temp 97.3°F | Resp 20 | Ht 64.0 in | Wt 139.4 lb

## 2012-07-19 DIAGNOSIS — C50419 Malignant neoplasm of upper-outer quadrant of unspecified female breast: Secondary | ICD-10-CM

## 2012-07-19 DIAGNOSIS — Z5112 Encounter for antineoplastic immunotherapy: Secondary | ICD-10-CM

## 2012-07-19 DIAGNOSIS — C50411 Malignant neoplasm of upper-outer quadrant of right female breast: Secondary | ICD-10-CM

## 2012-07-19 DIAGNOSIS — L739 Follicular disorder, unspecified: Secondary | ICD-10-CM

## 2012-07-19 DIAGNOSIS — L738 Other specified follicular disorders: Secondary | ICD-10-CM

## 2012-07-19 DIAGNOSIS — L678 Other hair color and hair shaft abnormalities: Secondary | ICD-10-CM

## 2012-07-19 LAB — COMPREHENSIVE METABOLIC PANEL (CC13)
ALT: 31 U/L (ref 0–55)
AST: 21 U/L (ref 5–34)
Albumin: 3.6 g/dL (ref 3.5–5.0)
Alkaline Phosphatase: 98 U/L (ref 40–150)
BUN: 10.7 mg/dL (ref 7.0–26.0)
CO2: 26 mEq/L (ref 22–29)
Calcium: 9.2 mg/dL (ref 8.4–10.4)
Chloride: 100 mEq/L (ref 98–107)
Creatinine: 0.8 mg/dL (ref 0.6–1.1)
Glucose: 85 mg/dl (ref 70–99)
Potassium: 3.6 mEq/L (ref 3.5–5.1)
Sodium: 135 mEq/L — ABNORMAL LOW (ref 136–145)
Total Bilirubin: 0.37 mg/dL (ref 0.20–1.20)
Total Protein: 6.9 g/dL (ref 6.4–8.3)

## 2012-07-19 LAB — CBC WITH DIFFERENTIAL/PLATELET
BASO%: 0.2 % (ref 0.0–2.0)
Basophils Absolute: 0 10*3/uL (ref 0.0–0.1)
EOS%: 0.2 % (ref 0.0–7.0)
Eosinophils Absolute: 0 10*3/uL (ref 0.0–0.5)
HCT: 33.7 % — ABNORMAL LOW (ref 34.8–46.6)
HGB: 11.1 g/dL — ABNORMAL LOW (ref 11.6–15.9)
LYMPH%: 14.5 % (ref 14.0–49.7)
MCH: 28.7 pg (ref 25.1–34.0)
MCHC: 32.9 g/dL (ref 31.5–36.0)
MCV: 87.1 fL (ref 79.5–101.0)
MONO#: 0.4 10*3/uL (ref 0.1–0.9)
MONO%: 4.9 % (ref 0.0–14.0)
NEUT#: 7.1 10*3/uL — ABNORMAL HIGH (ref 1.5–6.5)
NEUT%: 80.2 % — ABNORMAL HIGH (ref 38.4–76.8)
Platelets: 82 10*3/uL — ABNORMAL LOW (ref 145–400)
RBC: 3.87 10*6/uL (ref 3.70–5.45)
RDW: 15.6 % — ABNORMAL HIGH (ref 11.2–14.5)
WBC: 8.8 10*3/uL (ref 3.9–10.3)
lymph#: 1.3 10*3/uL (ref 0.9–3.3)
nRBC: 0 % (ref 0–0)

## 2012-07-19 MED ORDER — SODIUM CHLORIDE 0.9 % IJ SOLN
10.0000 mL | INTRAMUSCULAR | Status: DC | PRN
  Administered 2012-07-19: 10 mL
  Filled 2012-07-19: qty 10

## 2012-07-19 MED ORDER — CLINDAMYCIN PHOSPHATE 1 % EX GEL: Freq: Two times a day (BID) | CUTANEOUS | Status: DC

## 2012-07-19 MED ORDER — HEPARIN SOD (PORK) LOCK FLUSH 100 UNIT/ML IV SOLN
500.0000 [IU] | Freq: Once | INTRAVENOUS | Status: AC | PRN
  Administered 2012-07-19: 500 [IU]
  Filled 2012-07-19: qty 5

## 2012-07-19 MED ORDER — DIPHENHYDRAMINE HCL 25 MG PO CAPS
50.0000 mg | ORAL_CAPSULE | Freq: Once | ORAL | Status: AC
  Administered 2012-07-19: 50 mg via ORAL

## 2012-07-19 MED ORDER — ACETAMINOPHEN 325 MG PO TABS
650.0000 mg | ORAL_TABLET | Freq: Once | ORAL | Status: AC
  Administered 2012-07-19: 650 mg via ORAL

## 2012-07-19 MED ORDER — TRASTUZUMAB CHEMO INJECTION 440 MG
2.0000 mg/kg | Freq: Once | INTRAVENOUS | Status: AC
  Administered 2012-07-19: 126 mg via INTRAVENOUS
  Filled 2012-07-19: qty 6

## 2012-07-19 NOTE — Telephone Encounter (Signed)
Pt given appt schedule for March thru June while in inf area.

## 2012-07-19 NOTE — Patient Instructions (Addendum)
[facility] Discharge Instructions for Patients Receiving Chemotherapy  Today you received the following chemotherapy agents Herceptin.  To help prevent nausea and vomiting after your treatment, we encourage you to take your nausea medication as prescribed.    If you develop nausea and vomiting that is not controlled by your nausea medication, call the clinic. If it is after clinic hours your family physician or the after hours number for the clinic or go to the Emergency Department.   BELOW ARE SYMPTOMS THAT SHOULD BE REPORTED IMMEDIATELY:  *FEVER GREATER THAN 100.5 F  *CHILLS WITH OR WITHOUT FEVER  NAUSEA AND VOMITING THAT IS NOT CONTROLLED WITH YOUR NAUSEA MEDICATION  *UNUSUAL SHORTNESS OF BREATH  *UNUSUAL BRUISING OR BLEEDING  TENDERNESS IN MOUTH AND THROAT WITH OR WITHOUT PRESENCE OF ULCERS  *URINARY PROBLEMS  *BOWEL PROBLEMS  UNUSUAL RASH Items with * indicate a potential emergency and should be followed up as soon as possible.   Please let the nurse know about any problems that you may have experienced. Feel free to call the clinic you have any questions or concerns. The clinic phone number is [phone].   I have been informed and understand all the instructions given to me. I know to contact the clinic, my physician, or go to the Emergency Department if any problems should occur. I do not have any questions at this time, but understand that I may call the clinic during office hours   should I have any questions or need assistance in obtaining follow up care.    __________________________________________  _____________  __________ Signature of Patient or Authorized Representative            Date                   Time    __________________________________________ Nurse's Signature    

## 2012-07-19 NOTE — Progress Notes (Signed)
OFFICE PROGRESS NOTE  CC  Heather Bradford, MD [address] [place] [id number] Dr. Chevis Pretty Dr. Chipper Herb  DIAGNOSIS: 67 year old female with invasive lobular breast cancer, stage IIB, receiving adjuvant chemotherapy  PRIOR THERAPY:  #1 patient was originally seen in the multidisciplinary breast clinic on 03/14/2012 for new diagnosis of invasive ductal carcinoma that was HER-2 positive node-positive, ER positive PR negative with Ki-67 of 44%  #2 on MRI patient also had an anterior breast mass that was to be biopsied to decide whether or not she should receive neoadjuvant chemotherapy or adjuvant chemotherapy. Patient'[name] mass was biopsied and it was positive for a malignancy that was HER-2/neu negative.  #3 patient is now status post right mastectomy with axillary lymph node dissection with the final pathology revealing the largest tumor measuring 5 cm, 1.7 cm and 1.6 cm. Tumor was ER positive PR negative HER-2/neu positive with a Ki-67 of 44% 3 of 7 lymph nodes were positive for metastatic disease.  #4 patient will proceed with adjuvant chemotherapy and Herceptin. Chemotherapy will consist of Taxotere carboplatinum given every 21 days with weekly Herceptin. Total of 6 cycles of Taxotere carboplatin will be administered starting on 06/14/2012. Once patient completes her chemotherapy then she will go on to receive radiation therapy along with Herceptin with the Herceptin then being converted to every 3 weeks to finish a total of one year. Because patient'[name] tumor is ER positive she will also be given antiestrogen therapy adjuvantly.  CURRENT THERAPY:Cycle 2 day 15 of TCH  INTERVAL HISTORY: Heather [name] is doing well today. She is here for her weekly Herceptin.  She is c/o of a break out on her scalp at previous hair follicles that are starting to itch.  She also has some blurred vision that hasn't resolved, it isn't worsening, but she is starting to have trouble reading things far away  as well as close up.  She denies any new pain/headaches.  Otherwise, she denies fevers, chills, nausea, vomiting, shortness of breath, numbness, or any other concerns.    MEDICAL HISTORY: Past Medical History  Diagnosis Date  . Chronic kidney disease   . Hx: UTI (urinary tract infection)   . Right shoulder pain   . Right knee pain   . Hypertension     recently started Aldactone   . Hyperlipidemia     but not on meds;diet and exercise controlled  . Dizziness     has been going on for 31months;medical MD aware  . H/O hiatal hernia   . GERD (gastroesophageal reflux disease)     Tums prn  . Hemorrhoids   . Urinary frequency     d/t taking Aldactone  . History of UTI   . Anemia     hx of  . Cataract     immature-not sure of which eye  . Anxiety     r/t updated surgery  . Insomnia     takes Melatoniin daily  . Complication of anesthesia     pt states she is sensitive to meds  . Breast cancer     right  . Skin cancer     ALLERGIES:  is allergic to codeine; keflex; naprosyn; and tussin.  MEDICATIONS:  Current Outpatient Prescriptions  Medication Sig Dispense Refill  . B Complex-C (SUPER B COMPLEX PO) Take 1 each by mouth daily.      . calcium carbonate (TUMS - DOSED IN MG ELEMENTAL CALCIUM) 500 MG chewable tablet Chew 1 tablet by mouth daily.      Marland Kitchen  Cholecalciferol (VITAMIN D-3 PO) Take 4,000 Units by mouth daily.      Marland Kitchen dexamethasone (DECADRON) 4 MG tablet Take 2 tablets (8 mg total) by mouth 2 (two) times daily with a meal. Take two times a day the day before Taxotere. Then take two times a day starting the day after chemo for 3 days.  36 tablet  5  . lidocaine-prilocaine (EMLA) cream Apply topically as needed.  30 g  7  . LORazepam (ATIVAN) 0.5 MG tablet Take 1 tablet (0.5 mg total) by mouth every 6 (six) hours as needed (Nausea or vomiting).  30 tablet  0  . magnesium gluconate (MAGONATE) 500 MG tablet Take 500 mg by mouth 2 (two) times daily.      Marland Kitchen omeprazole (PRILOSEC)  20 MG capsule Take 1 capsule (20 mg total) by mouth daily.  90 capsule  3  . ondansetron (ZOFRAN) 8 MG tablet Take 1 tablet (8 mg total) by mouth 2 (two) times daily. Take two times a day starting the day after chemo for 3 days. Then take two times a day as needed for nausea or vomiting.  30 tablet  1  . PREVIDENT 5000 BOOSTER PLUS 1.1 % PSTE Take 1 application by mouth Twice daily.      . Probiotic Product (PROBIOTIC DAILY PO) Take 1 each by mouth.      . prochlorperazine (COMPAZINE) 10 MG tablet Take 1 tablet (10 mg total) by mouth every 6 (six) hours as needed (Nausea or vomiting).  30 tablet  1  . prochlorperazine (COMPAZINE) 25 MG suppository Place 1 suppository (25 mg total) rectally every 12 (twelve) hours as needed for nausea.  12 suppository  3  . spironolactone (ALDACTONE) 25 MG tablet Take 0.5 tablets (12.5 mg total) by mouth daily.  30 tablet  3  . ciprofloxacin (CIPRO) 500 MG tablet Take 1 tablet (500 mg total) by mouth 2 (two) times daily.  14 tablet  4  . clindamycin (CLINDAGEL) 1 % gel Apply topically 2 (two) times daily.  30 g  0  . predniSONE (DELTASONE) 5 MG tablet Take 1 tablet (5 mg total) by mouth daily.  30 tablet  2   No current facility-administered medications for this visit.   Facility-Administered Medications Ordered in Other Visits  Medication Dose Route Frequency Provider Last Rate Last Dose  . heparin lock flush 100 unit/mL  500 Units Intracatheter Once PRN Victorino December, MD      . sodium chloride 0.9 % injection 10 mL  10 mL Intracatheter PRN Victorino December, MD        SURGICAL HISTORY:  Past Surgical History  Procedure Laterality Date  . Cyst removed from left breast  1970  . Left wrist surgery   2004    with plate  . Colonoscopy    . Esophagogastroduodenoscopy    . Mastectomy, radical  05/11/2011    right   . Mastectomy modified radical  05/10/2012    Procedure: MASTECTOMY MODIFIED RADICAL;  Surgeon: Robyne Askew, MD;  Location: [facility] OR;  Service:  General;  Laterality: Right;  RIGHT MODIFIED RADICAL MASTECTOMY  . Portacath placement  05/10/2012    Procedure: INSERTION PORT-A-CATH;  Surgeon: Robyne Askew, MD;  Location: [facility] OR;  Service: General;  Laterality: Left;  . Wrist fracture surgery  2004    REVIEW OF SYSTEMS:  General: fatigue (-), night sweats (-), fever (-), pain (-) Lymph: palpable nodes (-) HEENT: vision changes (-), mucositis (-),  gum bleeding (-), epistaxis (-) Cardiovascular: chest pain (-), palpitations (-) Pulmonary: shortness of breath (-), dyspnea on exertion (-), cough (-), hemoptysis (-) GI:  Early satiety (-), melena (-), dysphagia (-), nausea/vomiting (-), diarrhea (-) GU: dysuria (-), hematuria (-), incontinence (-) Musculoskeletal: joint swelling (-), joint pain (-), back pain (-) Neuro: weakness (-), numbness (-), headache (-), confusion (-) Skin: Rash (-), lesions (-), dryness (-) Psych: depression (-), suicidal/homicidal ideation (-), feeling of hopelessness (-)    PHYSICAL EXAMINATION: Blood pressure 144/85, pulse 77, temperature 97.3 F (36.3 C), temperature source Oral, resp. rate 20, height 5\' 4"  (1.626 m), weight 139 lb 6.4 oz (63.231 kg). Body mass index is 23.92 kg/(m^2). General: Patient is a well appearing female in no acute distress HEENT: PERRLA, sclerae anicteric no conjunctival pallor, MMM Neck: supple, no palpable adenopathy Lungs: clear to auscultation bilaterally, no wheezes, rhonchi, or rales Cardiovascular: regular rate rhythm, S1, S2, no murmurs, rubs or gallops Abdomen: Soft, non-tender, non-distended, normoactive bowel sounds, no HSM Extremities: warm and well perfused, no clubbing, cyanosis, or edema Skin: No rashes or lesions, pustular rash on scalp Neuro: Non-focal Breasts: The right breast is status post mastectomy. There is no evidence of local recurrence. The right axilla is benign. The left breast is unremarkable. ECOG PERFORMANCE STATUS: 1   LABORATORY DATA: Lab  Results  Component Value Date   WBC 8.8 07/19/2012   HGB 11.1* 07/19/2012   HCT 33.7* 07/19/2012   MCV 87.1 07/19/2012   PLT 82 Platelet count confirmed by slide estimate* 07/19/2012      Chemistry      Component Value Date/Time   NA 135* 07/19/2012 1017   NA 138 05/11/2012 0640   K 3.6 07/19/2012 1017   K 4.7 05/11/2012 0640   CL 100 07/19/2012 1017   CL 99 05/11/2012 0640   CO2 26 07/19/2012 1017   CO2 20 05/11/2012 0640   BUN 10.7 07/19/2012 1017   BUN 12 05/11/2012 0640   CREATININE 0.8 07/19/2012 1017   CREATININE 0.88 05/11/2012 0640      Component Value Date/Time   CALCIUM 9.2 07/19/2012 1017   CALCIUM 9.7 05/11/2012 0640   ALKPHOS 98 07/19/2012 1017   AST 21 07/19/2012 1017   ALT 31 07/19/2012 1017   BILITOT 0.37 07/19/2012 1017     ADDITIONAL INFORMATION: CHROMOGENIC IN-SITU HYBRIDIZATION (1C - TUMOR WITH CLIP) Interpretation HER-2/NEU BY CISH - NO AMPLIFICATION OF HER-2 DETECTED. THE RATIO OF HER-2: CEP 17 SIGNALS WAS 1.25. Reference range: Ratio: HER2:CEP17 < 1.8 - gene amplification not observed Ratio: HER2:CEP 17 1.8-2.2 - equivocal result Ratio: HER2:CEP17 > 2.2 - gene amplification observed CHROMOGENIC IN-SITU HYBRIDIZATION (1E - TUMOR WITHOUT CLIP) Interpretation: HER2/NEU BY CISH - SHOWS AMPLIFICATION BY CISH ANALYSIS. THE RATIO OF HER2: CEP 17 SIGNALS WAS 2.83. Reference range: Ratio: HER2:CEP17 < 1.8 gene amplification not observed Ratio: HER2:CEP 17 1.8-2.2 - equivocal result Ratio: HER2:CEP17 > 2.2 - gene amplification observed Pecola Leisure MD Pathologist, Electronic Signature ( Signed 05/15/2012) FINAL DIAGNOSIS Diagnosis Breast, modified radical mastectomy , right, with axillary contents 1 of 4 FINAL for [place], PAK [name] ([id number]) Diagnosis(continued) TUMOR #1 (WITH CLIP): - INVASIVE LOBULAR CARCINOMA, GRADE II, SEE COMMENT. - LYMPHOVASCULAR INVASION IDENTIFIED. - PREVIOUS BIOPSY SITE IDENTIFIED. TUMOR #2 (NO CLIP): - INVASIVE LOBULAR CARCINOMA, GRADE II, SEE  COMMENT. - LOBULAR CARCINOMA IN SITU. - LYMPHOVASCULAR INVASION IDENTIFIED. - THREE LYMPH NODES, POSITIVE FOR METASTATIC MAMMARY CARCINOMA (3/7). - TUMOR DEPOSITS ARE 1.7, 1.6, AND 5 CM. -  EXTRACAPSULAR TUMOR EXTENSION IDENTIFIED. - TWO LYMPH NODES, POSITIVE FOR ISOLATED TUMOR CELLS. Microscopic Comment BREAST, INVASIVE TUMOR, WITH LYMPH NODE SAMPLING Specimen, including laterality: Right breast Procedure: Modified radical mastectomy Grade: Tumor 1 and tumor 2: Grade II of III Tubule formation: 3 Nuclear pleomorphism: 2 Mitotic: 1 Tumor size (gross measurement: Tumor #1- 2.3 cm; tumor #2 - 2.4 cm Margins: Invasive, distance to closest margin: Tumor #1 2.7 cm - deep margin; tumor #2 - 1.6 cm - deep margin Lymphovascular invasion: Present Ductal carcinoma in situ: Absent Grade: N/A Extensive intraductal component: N/A Lobular neoplasia: Both atypical hyperplasia and in situ carcinoma Extent of tumor: Skin: Grossly negative Nipple: Grossly negative Skeletal muscle: Microscopically negative Lymph nodes: # examined: 7 Lymph nodes with metastasis: 3 Isolated tumor cells (< 0.2 mm): two lymph nodes (Slide 1P and 1O) Micrometastasis: (> 0.2 mm and < 2.0 mm): none Macrometastasis: (> 2.0 mm): 1.7 cm , 1.6 cm and 5 cm Extracapsular extension: Present Breast prognostic profile: Tumor #1: Estrogen receptor: Not repeated, previous study demonstrated 98% positivity ([id number]) Progesterone receptor: Not repeated, previous study demonstrated 0% positivity ([id number]) Her 2 neu: Repeated, previous study demonstrated no amplification (1.38) ([id number]) Ki-67: Not repeated, previous study demonstrated 5% proliferation rate ([id number]) Tumor #2: Estrogen receptor: Not repeated, previous study demonstrated 97% positivity ([id number]) Progesterone receptor: Not repeated, previous study demonstrated 0% positivity ([id number]) Her 2 neu: Repeated, previous study demonstrated  amplification (3.65) ([id number]) Ki-67: Not repeated, previous study demonstrated 44% proliferation rate ([id number]) 2 of 4 FINAL for [name], Heather [name] ([id number]) Microscopic Comment(continued) Non-neoplastic breast: Benign fibrocystic change with usual ductal hyperplasia, sclerosing adenosis, benign radial sclerosing lesion, and microcalcifications in benign ducts and lobules. TNM: mpT2, pN1a, pMX. Comments: none  RADIOGRAPHIC STUDIES:  Mr Lodema Pilot Contrast  03/23/2012  *RADIOLOGY REPORT*  Clinical Data: New diagnosis breast cancer.  Rule out metastatic disease.  MRI HEAD WITHOUT AND WITH CONTRAST  Technique:  Multiplanar, multiecho pulse sequences of the brain and surrounding structures were obtained according to standard protocol without and with intravenous contrast  Contrast: 12mL MULTIHANCE GADOBENATE DIMEGLUMINE 529 MG/ML IV SOLN  Comparison: None.  Findings: Ventricle size is normal.  Small 4 mm hyperintensities in the cerebral white matter bilaterally do not enhance and are most likely due to chronic microvascular ischemia.  Negative for acute infarct.  Diffusion weighted imaging is normal.  Pituitary is normal in size.  Negative for intracranial hemorrhage.  Following contrast infusion, no enhancing lesions are identified.  Paranasal sinuses are clear.  Vessels at the base of the brain are patent.  IMPRESSION: Negative for metastatic disease.  No acute abnormality.   Original Report Authenticated By: Janeece Riggers, M.D.    Mr Breast Bilateral W Wo Contrast  03/12/2012  *RADIOLOGY REPORT*  Clinical Data: Recently diagnosed invasive mammary carcinoma in the 10 o'clock region of the right breast with a metastatic right axillary lymph node.  BILATERAL BREAST MRI WITH AND WITHOUT CONTRAST  Technique: Multiplanar, multisequence MR images of both breasts were obtained prior to and following the intravenous administration of 12ml of multihance.  Three dimensional images were evaluated at the  independent DynaCad workstation.  Comparison:  Mammograms dated 03/05/2012, 03/01/2012, 03/01/2011 and 02/26/2010 from [facility]  Findings: There is a moderate background parenchymal enhancement pattern.  Right breast: 1.  There is a lobulated 2.2 x 1.7 x 1.8 cm mass in the posterior third of the upper outer quadrant of the right breast.  There is a post biopsy clip artifact 1  cm lateral to the mass. 2.  In the anterior third upper outer quadrant of the right breast there is patchy non mass like enhancement measuring 0.8 x 0.3 x 0.3 cm.  Left breast:  There is no abnormal enhancement the left breast.  There are enlarged right axillary lymph nodes.  The largest right axillary lymph node measures 2.4 cm and is located lateral to the pectoralis minor consistent with level I adenopathy.  There is a 1.6 cm lymph node behind the pectoralis minor consistent with enlarged level II adenopathy.  IMPRESSION:  1.  2.2 cm mass in the posterior third of the upper outer quadrant of the right breast corresponding with the known invasive mammary carcinoma. 2.  Patchy non mass-like enhancement in the anterior third of the upper outer quadrant of the right breast.  MR guided core biopsy is recommended. 3. Enlarged level I and II right axillary adenopathy.  RECOMMENDATION: MR guided core biopsy of the right breast.  THREE-DIMENSIONAL MR IMAGE RENDERING ON INDEPENDENT WORKSTATION:  Three-dimensional MR images were rendered by post-processing of the original MR data on an independent workstation.  The three- dimensional MR images were interpreted, and findings were reported in the accompanying complete MRI report for this study.  BI-RADS CATEGORY 4:  Suspicious abnormality - biopsy should be considered.   Original Report Authenticated By: Baird Lyons, M.D.    Mr Biopsy/wire Localization  03/20/2012  *RADIOLOGY REPORT*  Clinical Data:  Known right breast carcinoma.  Additional area of worrisome enhancement located within  the anterior upper outer quadrant of the right breast.  MRI GUIDED VACUUM ASSISTED BIOPSY OF THE RIGHT BREAST WITHOUT AND WITH CONTRAST  Comparison: Previous exams.  Technique: Multiplanar, multisequence MR images of the right breast were obtained prior to and following the intravenous administration of 12 ml of Mulithance.  I met with the patient, and we discussed the procedure of MRI guided biopsy, including risks, benefits, and alternatives. Specifically, we discussed the risks of infection, bleeding, tissue injury, clip migration, and inadequate sampling.  Informed, written consent was given.  Using sterile technique, 2% Lidocaine, MRI guidance, and a 9 gauge vacuum assisted device, biopsy was performed of the area of enhancement located within the anterior portion of the upper-outer quadrant - right breast using a lateral approach.  At the conclusion of the procedure, a  tissue marker clip was deployed into the biopsy cavity.  IMPRESSION: MRI guided biopsy of the area of enhancement located within the anterior portion of the upper-outer quadrant of the right breast as discussed above. No apparent complications.  BI-RADS CATEGORY 6:  Known biopsy-proven malignancy - appropriate action should be taken.  THREE-DIMENSIONAL MR IMAGE RENDERING ON INDEPENDENT WORKSTATION:  Three-dimensional MR images were rendered by post-processing of the original MR data on an independent workstation.  The three- dimensional MR images were interpreted, and findings were reported in the accompanying complete MRI report for this study.  The pathology associated with the right breast MR guided core biopsy demonstrated invasive mammary carcinoma and in situ mammary carcinoma.  This is concordant with imaging findings.  I have discussed findings with the patient by telephone and answered her questions.  The patient states that biopsy site is clean and dry without hematoma formation or signs of infection.  Post biopsy wound care  instructions were reviewed with the patient.  Follow-up will be with Dr. Carolynne Edouard and Dr. Welton Flakes regards her treatment plan.  The patient was encouraged to call the Breast Center for additional questions or concerns  Original Report Authenticated By: Rolla Plate, M.D.    Nm Pet Image Initial (pi) Skull Base To Thigh  03/22/2012  *RADIOLOGY REPORT*  Clinical Data: Initial treatment strategy for high risk right breast cancer.  NUCLEAR MEDICINE PET SKULL BASE TO THIGH  Fasting Blood Glucose:  100  Technique:  18.6 mCi F-18 FDG was injected intravenously. CT data was obtained and used for attenuation correction and anatomic localization only.  (This was not acquired as a diagnostic CT examination.) Additional exam technical data entered on technologist worksheet.  Comparison:  MRI breast dated 03/12/2012  Findings:  Neck: No hypermetabolic lymph nodes in the neck.  Chest:  Vague hypermetabolism in the upper/central right breast, max SUV 2.7.  Additional focal hypermetabolism in the upper/outer right breast, max SUV 2.3 (PET image 105).  These findings correspond to biopsy-proven primary breast carcinoma.  1.3 cm short axis right subpectoral lymph node (series 2/image 66), max SUV 3.1.  Right axillary lymphadenopathy measuring up to 1.6 cm short axis (series 2/image 88), max SUV 4.8).  No hypermetabolic mediastinal or hilar nodes.  No suspicious pulmonary nodules on the CT scan.  Abdomen/Pelvis:  No abnormal hypermetabolic activity within the liver, pancreas, adrenal glands, or spleen.  No hypermetabolic lymph nodes in the abdomen or pelvis.  Skeleton:  No focal hypermetabolic activity to suggest skeletal metastasis.  IMPRESSION: Two foci of hypermetabolism in the upper right breast, max SUV 2.7, corresponding to biopsy-proven primary breast carcinoma.  Associated right subpectoral and axillary nodal metastases, max SUV 4.8.  No evidence of distant metastases.   Original Report Authenticated By: Charline Bills, M.D.     Mm Digital Diagnostic Unilat R  03/20/2012  *RADIOLOGY REPORT*  Clinical Data:  Post right breast MR guided core biopsy.  DIGITAL DIAGNOSTIC RIGHT BREAST MAMMOGRAM  Comparison:  03/05/2012, 03/01/2012, 03/01/2011, 02/26/2010 mammograms from [facility] imaging.  Findings:  Films are performed following MR guided biopsy of the anterior portion of the upper-outer quadrant of the right breast. The hourglass shaped clip placed during the MR guided core biopsy appears in appropriate position.  IMPRESSION: Appropriate positioning of clip following right breast MR guided core biopsy.   Original Report Authenticated By: Rolla Plate, M.D.     ASSESSMENT: 67 year old [name], Heather [name]  #1  status post mastectomy with right axillary lymph node dissection 05/10/2012 for an mpT2 N1, stage stage IIB invasive lobular breast cancer, stage IIB, ER positive, HER-2/neu positive, with an Mib-1 between 5% and 44%  #2 receiving adjuvant chemotherapy consisting of Taxotere, carboplatinum and Herceptin given every 21 days for total of 6 cycles of Taxotere carboplatinum and weekly Herceptin beginning on 06/14/12.  #3 Herceptin to be continued to total one year; most recent echo 03/28/2012  PLAN:   1. Heather [name] is doing well.  She tolerated her Cipro well, her labs are recovering.  She will proceed with Herceptin today.    2.  She would prefer to discuss her blurred vision with Dr. Welton Flakes at her next appt.    3. She will continue to take Super B complex daily.  Should her numbness progress we will start Gabapentin, however, she hasn't had any recently.  4. For her folliculitis, I prescribed topical Clindamycin and encouraged warm compresses to the scalp TID.  I gave her patient information in her AVS regarding folliculitis.   All questions were answered. The patient knows to call the clinic with any problems, questions or concerns. We can certainly see the patient much sooner if necessary.  I spent 25 minutes  counseling the patient face to face. The total time spent in the appointment was 30 minutes.  Cherie Ouch Lyn Hollingshead, NP Medical Oncology [facility] Phone: [phone]  Augustin Schooling   07/19/2012, 1:14 PM

## 2012-07-19 NOTE — Patient Instructions (Signed)
Warm compresses to scalp three times a day.    Folliculitis  Folliculitis is redness, soreness, and swelling (inflammation) of the hair follicles. This condition can occur anywhere on the body. People with weakened immune systems, diabetes, or obesity have a greater risk of getting folliculitis. CAUSES  Bacterial infection. This is the most common cause.  Fungal infection.  Viral infection.  Contact with certain chemicals, especially oils and tars. Long-term folliculitis can result from bacteria that live in the nostrils. The bacteria may trigger multiple outbreaks of folliculitis over time. SYMPTOMS Folliculitis most commonly occurs on the scalp, thighs, legs, back, buttocks, and areas where hair is shaved frequently. An early sign of folliculitis is a small, white or yellow, pus-filled, itchy lesion (pustule). These lesions appear on a red, inflamed follicle. They are usually less than 0.2 inches (5 mm) wide. When there is an infection of the follicle that goes deeper, it becomes a boil or furuncle. A group of closely packed boils creates a larger lesion (carbuncle). Carbuncles tend to occur in hairy, sweaty areas of the body. DIAGNOSIS  Your caregiver can usually tell what is wrong by doing a physical exam. A sample may be taken from one of the lesions and tested in a lab. This can help determine what is causing your folliculitis. TREATMENT  Treatment may include:  Applying warm compresses to the affected areas.  Taking antibiotic medicines orally or applying them to the skin.  Draining the lesions if they contain a large amount of pus or fluid.  Laser hair removal for cases of long-lasting folliculitis. This helps to prevent regrowth of the hair. HOME CARE INSTRUCTIONS  Apply warm compresses to the affected areas as directed by your caregiver.  If antibiotics are prescribed, take them as directed. Finish them even if you start to feel better.  You may take over-the-counter  medicines to relieve itching.  Do not shave irritated skin.  Follow up with your caregiver as directed. SEEK IMMEDIATE MEDICAL CARE IF:   You have increasing redness, swelling, or pain in the affected area.  You have a fever. MAKE SURE YOU:  Understand these instructions.  Will watch your condition.  Will get help right away if you are not doing well or get worse. Document Released: 07/04/2001 Document Revised: 10/25/2011 Document Reviewed: 07/26/2011 [facility] Patient Information 2013 [place], [place].

## 2012-07-26 ENCOUNTER — Ambulatory Visit (HOSPITAL_BASED_OUTPATIENT_CLINIC_OR_DEPARTMENT_OTHER): Payer: Medicare Other

## 2012-07-26 ENCOUNTER — Other Ambulatory Visit: Payer: Medicare Other | Admitting: Lab

## 2012-07-26 ENCOUNTER — Other Ambulatory Visit (HOSPITAL_BASED_OUTPATIENT_CLINIC_OR_DEPARTMENT_OTHER): Payer: Medicare Other | Admitting: Lab

## 2012-07-26 ENCOUNTER — Ambulatory Visit (HOSPITAL_BASED_OUTPATIENT_CLINIC_OR_DEPARTMENT_OTHER): Payer: Medicare Other | Admitting: Oncology

## 2012-07-26 VITALS — BP 128/92 | HR 82 | Temp 98.3°F | Resp 20 | Ht 64.0 in | Wt 142.7 lb

## 2012-07-26 DIAGNOSIS — C50411 Malignant neoplasm of upper-outer quadrant of right female breast: Secondary | ICD-10-CM

## 2012-07-26 DIAGNOSIS — C50419 Malignant neoplasm of upper-outer quadrant of unspecified female breast: Secondary | ICD-10-CM

## 2012-07-26 DIAGNOSIS — Z5111 Encounter for antineoplastic chemotherapy: Secondary | ICD-10-CM

## 2012-07-26 DIAGNOSIS — Z5112 Encounter for antineoplastic immunotherapy: Secondary | ICD-10-CM

## 2012-07-26 DIAGNOSIS — Z170 Estrogen receptor positive status [ER+]: Secondary | ICD-10-CM

## 2012-07-26 LAB — COMPREHENSIVE METABOLIC PANEL (CC13)
ALT: 28 U/L (ref 0–55)
AST: 20 U/L (ref 5–34)
Albumin: 3.9 g/dL (ref 3.5–5.0)
Alkaline Phosphatase: 83 U/L (ref 40–150)
BUN: 14.5 mg/dL (ref 7.0–26.0)
CO2: 23 mEq/L (ref 22–29)
Calcium: 9.7 mg/dL (ref 8.4–10.4)
Chloride: 100 mEq/L (ref 98–107)
Creatinine: 0.8 mg/dL (ref 0.6–1.1)
Glucose: 109 mg/dl — ABNORMAL HIGH (ref 70–99)
Potassium: 3.7 mEq/L (ref 3.5–5.1)
Sodium: 134 mEq/L — ABNORMAL LOW (ref 136–145)
Total Bilirubin: 0.41 mg/dL (ref 0.20–1.20)
Total Protein: 7.1 g/dL (ref 6.4–8.3)

## 2012-07-26 LAB — CBC WITH DIFFERENTIAL/PLATELET
BASO%: 0.1 % (ref 0.0–2.0)
Basophils Absolute: 0 10*3/uL (ref 0.0–0.1)
EOS%: 0 % (ref 0.0–7.0)
Eosinophils Absolute: 0 10*3/uL (ref 0.0–0.5)
HCT: 32.2 % — ABNORMAL LOW (ref 34.8–46.6)
HGB: 10.7 g/dL — ABNORMAL LOW (ref 11.6–15.9)
LYMPH%: 8 % — ABNORMAL LOW (ref 14.0–49.7)
MCH: 29.2 pg (ref 25.1–34.0)
MCHC: 33.2 g/dL (ref 31.5–36.0)
MCV: 88 fL (ref 79.5–101.0)
MONO#: 0.8 10*3/uL (ref 0.1–0.9)
MONO%: 9.1 % (ref 0.0–14.0)
NEUT#: 7.2 10*3/uL — ABNORMAL HIGH (ref 1.5–6.5)
NEUT%: 82.8 % — ABNORMAL HIGH (ref 38.4–76.8)
Platelets: 214 10*3/uL (ref 145–400)
RBC: 3.66 10*6/uL — ABNORMAL LOW (ref 3.70–5.45)
RDW: 17.7 % — ABNORMAL HIGH (ref 11.2–14.5)
WBC: 8.7 10*3/uL (ref 3.9–10.3)
lymph#: 0.7 10*3/uL — ABNORMAL LOW (ref 0.9–3.3)
nRBC: 0 % (ref 0–0)

## 2012-07-26 MED ORDER — SODIUM CHLORIDE 0.9 % IJ SOLN
10.0000 mL | INTRAMUSCULAR | Status: DC | PRN
  Administered 2012-07-26: 10 mL
  Filled 2012-07-26: qty 10

## 2012-07-26 MED ORDER — SODIUM CHLORIDE 0.9 % IV SOLN
75.0000 mg/m2 | Freq: Once | INTRAVENOUS | Status: AC
  Administered 2012-07-26: 120 mg via INTRAVENOUS
  Filled 2012-07-26: qty 12

## 2012-07-26 MED ORDER — ACETAMINOPHEN 325 MG PO TABS
650.0000 mg | ORAL_TABLET | Freq: Once | ORAL | Status: AC
  Administered 2012-07-26: 650 mg via ORAL

## 2012-07-26 MED ORDER — SODIUM CHLORIDE 0.9 % IV SOLN
Freq: Once | INTRAVENOUS | Status: AC
  Administered 2012-07-26: 12:00:00 via INTRAVENOUS

## 2012-07-26 MED ORDER — SODIUM CHLORIDE 0.9 % IV SOLN
570.0000 mg | Freq: Once | INTRAVENOUS | Status: AC
  Administered 2012-07-26: 570 mg via INTRAVENOUS
  Filled 2012-07-26: qty 57

## 2012-07-26 MED ORDER — TRASTUZUMAB CHEMO INJECTION 440 MG
2.0000 mg/kg | Freq: Once | INTRAVENOUS | Status: AC
  Administered 2012-07-26: 126 mg via INTRAVENOUS
  Filled 2012-07-26: qty 6

## 2012-07-26 MED ORDER — HEPARIN SOD (PORK) LOCK FLUSH 100 UNIT/ML IV SOLN
500.0000 [IU] | Freq: Once | INTRAVENOUS | Status: AC | PRN
  Administered 2012-07-26: 500 [IU]
  Filled 2012-07-26: qty 5

## 2012-07-26 MED ORDER — ONDANSETRON 16 MG/50ML IVPB (CHCC)
16.0000 mg | Freq: Once | INTRAVENOUS | Status: AC
  Administered 2012-07-26: 16 mg via INTRAVENOUS

## 2012-07-26 MED ORDER — DEXAMETHASONE SODIUM PHOSPHATE 4 MG/ML IJ SOLN
20.0000 mg | Freq: Once | INTRAMUSCULAR | Status: AC
  Administered 2012-07-26: 20 mg via INTRAVENOUS

## 2012-07-26 MED ORDER — DIPHENHYDRAMINE HCL 25 MG PO CAPS
50.0000 mg | ORAL_CAPSULE | Freq: Once | ORAL | Status: AC
  Administered 2012-07-26: 50 mg via ORAL

## 2012-07-26 NOTE — Patient Instructions (Addendum)
Proceed with chemotherapy today  We will see you back in 1 week  Lab Results  Component Value Date   WBC 8.7 07/26/2012   HGB 10.7* 07/26/2012   HCT 32.2* 07/26/2012   MCV 88.0 07/26/2012   PLT 214 07/26/2012

## 2012-07-26 NOTE — Patient Instructions (Signed)
[facility] Discharge Instructions for Patients Receiving Chemotherapy  Today you received the following chemotherapy agents: Taxotere, Carboplatin and Herceptin. To help prevent nausea and vomiting after your treatment, we encourage you to take your nausea medication, Zofran. Begin taking it at the morning of 3/21. Take it twice daily for 3 days. Take Compazine every six hours as needed for nausea.  If you develop nausea and vomiting that is not controlled by your nausea medication, call the clinic. If it is after clinic hours your family physician or the after hours number for the clinic or go to the Emergency Department.   BELOW ARE SYMPTOMS THAT SHOULD BE REPORTED IMMEDIATELY:  *FEVER GREATER THAN 100.5 F  *CHILLS WITH OR WITHOUT FEVER  NAUSEA AND VOMITING THAT IS NOT CONTROLLED WITH YOUR NAUSEA MEDICATION  *UNUSUAL SHORTNESS OF BREATH  *UNUSUAL BRUISING OR BLEEDING  TENDERNESS IN MOUTH AND THROAT WITH OR WITHOUT PRESENCE OF ULCERS  *URINARY PROBLEMS  *BOWEL PROBLEMS  UNUSUAL RASH Items with * indicate a potential emergency and should be followed up as soon as possible.  Feel free to call the clinic you have any questions or concerns. The clinic phone number is [phone].   I have been informed and understand all the instructions given to me. I know to contact the clinic, my physician, or go to the Emergency Department if any problems should occur. I do not have any questions at this time, but understand that I may call the clinic during office hours   should I have any questions or need assistance in obtaining follow up care.

## 2012-07-26 NOTE — Progress Notes (Signed)
Pt declined Lorazepam pre-medication. States it made her too drowsy coupled with Benadryl.

## 2012-07-27 ENCOUNTER — Telehealth: Payer: Self-pay | Admitting: *Deleted

## 2012-07-27 ENCOUNTER — Other Ambulatory Visit: Payer: Self-pay | Admitting: Certified Registered Nurse Anesthetist

## 2012-07-27 ENCOUNTER — Ambulatory Visit (HOSPITAL_BASED_OUTPATIENT_CLINIC_OR_DEPARTMENT_OTHER): Payer: Medicare Other

## 2012-07-27 VITALS — BP 150/74 | HR 71 | Temp 97.9°F

## 2012-07-27 DIAGNOSIS — C50419 Malignant neoplasm of upper-outer quadrant of unspecified female breast: Secondary | ICD-10-CM

## 2012-07-27 DIAGNOSIS — Z5189 Encounter for other specified aftercare: Secondary | ICD-10-CM

## 2012-07-27 DIAGNOSIS — C773 Secondary and unspecified malignant neoplasm of axilla and upper limb lymph nodes: Secondary | ICD-10-CM

## 2012-07-27 MED ORDER — PEGFILGRASTIM INJECTION 6 MG/0.6ML
6.0000 mg | Freq: Once | SUBCUTANEOUS | Status: AC
  Administered 2012-07-27: 6 mg via SUBCUTANEOUS
  Filled 2012-07-27: qty 0.6

## 2012-07-27 NOTE — Telephone Encounter (Signed)
Pt is aware of all her appts

## 2012-07-27 NOTE — Patient Instructions (Addendum)

## 2012-08-01 ENCOUNTER — Other Ambulatory Visit: Payer: Self-pay | Admitting: Medical Oncology

## 2012-08-01 DIAGNOSIS — C50411 Malignant neoplasm of upper-outer quadrant of right female breast: Secondary | ICD-10-CM

## 2012-08-02 ENCOUNTER — Other Ambulatory Visit (HOSPITAL_BASED_OUTPATIENT_CLINIC_OR_DEPARTMENT_OTHER): Payer: Medicare Other | Admitting: Lab

## 2012-08-02 ENCOUNTER — Ambulatory Visit (HOSPITAL_BASED_OUTPATIENT_CLINIC_OR_DEPARTMENT_OTHER): Payer: Medicare Other | Admitting: Adult Health

## 2012-08-02 ENCOUNTER — Ambulatory Visit (HOSPITAL_BASED_OUTPATIENT_CLINIC_OR_DEPARTMENT_OTHER): Payer: Medicare Other

## 2012-08-02 ENCOUNTER — Telehealth: Payer: Self-pay | Admitting: *Deleted

## 2012-08-02 ENCOUNTER — Encounter: Payer: Self-pay | Admitting: Adult Health

## 2012-08-02 VITALS — BP 123/83 | HR 88 | Temp 98.1°F | Resp 20 | Ht 64.0 in | Wt 137.8 lb

## 2012-08-02 DIAGNOSIS — C50419 Malignant neoplasm of upper-outer quadrant of unspecified female breast: Secondary | ICD-10-CM

## 2012-08-02 DIAGNOSIS — D709 Neutropenia, unspecified: Secondary | ICD-10-CM

## 2012-08-02 DIAGNOSIS — Z5112 Encounter for antineoplastic immunotherapy: Secondary | ICD-10-CM

## 2012-08-02 DIAGNOSIS — C50411 Malignant neoplasm of upper-outer quadrant of right female breast: Secondary | ICD-10-CM

## 2012-08-02 DIAGNOSIS — Z170 Estrogen receptor positive status [ER+]: Secondary | ICD-10-CM

## 2012-08-02 DIAGNOSIS — H538 Other visual disturbances: Secondary | ICD-10-CM

## 2012-08-02 LAB — CBC WITH DIFFERENTIAL/PLATELET
BASO%: 0 % (ref 0.0–2.0)
Basophils Absolute: 0 10*3/uL (ref 0.0–0.1)
EOS%: 0.4 % (ref 0.0–7.0)
Eosinophils Absolute: 0 10*3/uL (ref 0.0–0.5)
HCT: 32.5 % — ABNORMAL LOW (ref 34.8–46.6)
HGB: 11.1 g/dL — ABNORMAL LOW (ref 11.6–15.9)
LYMPH%: 42.2 % (ref 14.0–49.7)
MCH: 29.8 pg (ref 25.1–34.0)
MCHC: 34.2 g/dL (ref 31.5–36.0)
MCV: 87.4 fL (ref 79.5–101.0)
MONO#: 1 10*3/uL — ABNORMAL HIGH (ref 0.1–0.9)
MONO%: 39.1 % — ABNORMAL HIGH (ref 0.0–14.0)
NEUT#: 0.5 10*3/uL — CL (ref 1.5–6.5)
NEUT%: 18.3 % — ABNORMAL LOW (ref 38.4–76.8)
Platelets: 146 10*3/uL (ref 145–400)
RBC: 3.72 10*6/uL (ref 3.70–5.45)
RDW: 16.7 % — ABNORMAL HIGH (ref 11.2–14.5)
WBC: 2.6 10*3/uL — ABNORMAL LOW (ref 3.9–10.3)
lymph#: 1.1 10*3/uL (ref 0.9–3.3)
nRBC: 0 % (ref 0–0)

## 2012-08-02 LAB — COMPREHENSIVE METABOLIC PANEL (CC13)
ALT: 22 U/L (ref 0–55)
AST: 18 U/L (ref 5–34)
Albumin: 3.6 g/dL (ref 3.5–5.0)
Alkaline Phosphatase: 97 U/L (ref 40–150)
BUN: 13.3 mg/dL (ref 7.0–26.0)
CO2: 26 mEq/L (ref 22–29)
Calcium: 8.9 mg/dL (ref 8.4–10.4)
Chloride: 95 mEq/L — ABNORMAL LOW (ref 98–107)
Creatinine: 0.8 mg/dL (ref 0.6–1.1)
Glucose: 111 mg/dl — ABNORMAL HIGH (ref 70–99)
Potassium: 3.6 mEq/L (ref 3.5–5.1)
Sodium: 129 mEq/L — ABNORMAL LOW (ref 136–145)
Total Bilirubin: 0.62 mg/dL (ref 0.20–1.20)
Total Protein: 6.5 g/dL (ref 6.4–8.3)

## 2012-08-02 MED ORDER — TRASTUZUMAB CHEMO INJECTION 440 MG
2.0000 mg/kg | Freq: Once | INTRAVENOUS | Status: AC
  Administered 2012-08-02: 126 mg via INTRAVENOUS
  Filled 2012-08-02: qty 6

## 2012-08-02 MED ORDER — DIPHENHYDRAMINE HCL 25 MG PO CAPS
50.0000 mg | ORAL_CAPSULE | Freq: Once | ORAL | Status: AC
  Administered 2012-08-02: 50 mg via ORAL

## 2012-08-02 MED ORDER — ACETAMINOPHEN 325 MG PO TABS
650.0000 mg | ORAL_TABLET | Freq: Once | ORAL | Status: AC
  Administered 2012-08-02: 650 mg via ORAL

## 2012-08-02 MED ORDER — HEPARIN SOD (PORK) LOCK FLUSH 100 UNIT/ML IV SOLN
500.0000 [IU] | Freq: Once | INTRAVENOUS | Status: AC | PRN
  Administered 2012-08-02: 500 [IU]
  Filled 2012-08-02: qty 5

## 2012-08-02 MED ORDER — SODIUM CHLORIDE 0.9 % IV SOLN
Freq: Once | INTRAVENOUS | Status: AC
  Administered 2012-08-02: 12:00:00 via INTRAVENOUS

## 2012-08-02 MED ORDER — SODIUM CHLORIDE 0.9 % IJ SOLN
10.0000 mL | INTRAMUSCULAR | Status: DC | PRN
  Administered 2012-08-02: 10 mL
  Filled 2012-08-02: qty 10

## 2012-08-02 NOTE — Telephone Encounter (Signed)
appt made and printed 

## 2012-08-02 NOTE — Patient Instructions (Addendum)
[facility] Discharge Instructions for Patients Receiving Chemotherapy  Today you received the following chemotherapy agents Herceptin.   If you develop nausea and vomiting that is not controlled by your nausea medication, call the clinic. If it is after clinic hours your family physician or the after hours number for the clinic or go to the Emergency Department.   BELOW ARE SYMPTOMS THAT SHOULD BE REPORTED IMMEDIATELY:  *FEVER GREATER THAN 100.5 F  *CHILLS WITH OR WITHOUT FEVER  NAUSEA AND VOMITING THAT IS NOT CONTROLLED WITH YOUR NAUSEA MEDICATION  *UNUSUAL SHORTNESS OF BREATH  *UNUSUAL BRUISING OR BLEEDING  TENDERNESS IN MOUTH AND THROAT WITH OR WITHOUT PRESENCE OF ULCERS  *URINARY PROBLEMS  *BOWEL PROBLEMS  UNUSUAL RASH Items with * indicate a potential emergency and should be followed up as soon as possible.  Feel free to call the clinic you have any questions or concerns. The clinic phone number is [phone].

## 2012-08-02 NOTE — Patient Instructions (Signed)
Doing well.  Proceed with Herceptin.     Patient Neutropenia Instruction Sheet  Diagnosis: Breast Cancer      Treating Physician: Drue Second, MD  Treatment: 1. Type of chemotherapy: TCH 2. Date of last treatment: 07/26/12  Last Blood Counts: Lab Results  Component Value Date   WBC 2.6* 08/02/2012   HGB 11.1* 08/02/2012   HCT 32.5* 08/02/2012   MCV 87.4 08/02/2012   PLT 146 08/02/2012  ANC 500      Prophylactic Antibiotics: Cipro 500 mg by mouth twice a day Instructions: 1. Monitor temperature and call if fever  greater than 100.5, chills, shaking chills (rigors) 2. Call Physician on-call at [phone] 3. Give him/her symptoms and list of medications that you are taking and your last blood count.

## 2012-08-02 NOTE — Progress Notes (Signed)
OFFICE PROGRESS NOTE  CC  Heather Bradford, MD [address] [place] [address] Dr. Chevis Pretty Dr. Chipper Herb  DIAGNOSIS: 67 year old female with invasive lobular breast cancer, stage IIB, receiving adjuvant chemotherapy  PRIOR THERAPY:  #1 patient was originally seen in the multidisciplinary breast clinic on 03/14/2012 for new diagnosis of invasive ductal carcinoma that was HER-2 positive node-positive, ER positive PR negative with Ki-67 of 44%  #2 on MRI patient also had an anterior breast mass that was to be biopsied to decide whether or not she should receive neoadjuvant chemotherapy or adjuvant chemotherapy. Patient's mass was biopsied and it was positive for a malignancy that was HER-2/neu negative.  #3 patient is now status post right mastectomy with axillary lymph node dissection with the final pathology revealing the largest tumor measuring 5 cm, 1.7 cm and 1.6 cm. Tumor was ER positive PR negative HER-2/neu positive with a Ki-67 of 44% 3 of 7 lymph nodes were positive for metastatic disease.  #4 patient will proceed with adjuvant chemotherapy and Herceptin. Chemotherapy will consist of Taxotere carboplatinum given every 21 days with weekly Herceptin. Total of 6 cycles of Taxotere carboplatin will be administered starting on 06/14/2012. Once patient completes her chemotherapy then she will go on to receive radiation therapy along with Herceptin with the Herceptin then being converted to every 3 weeks to finish a total of one year. Because patient's tumor is ER positive she will also be given antiestrogen therapy adjuvantly.  CURRENT THERAPY:Cycle 3 day 8 of TCH  INTERVAL HISTORY: Ms. Depaoli is doing well today. She is here for follow up after her third cycle of TCH.  Her scalp is improving and healing from the folliculitis.  She feels slightly more nauseated after this cycle.  She hasn't been taking the Prilosec two tablets in the morning as suggested, she's only taking one.   She does endorse mild abd pain after eating.  Her vision has remained the same. The nausea is relieved with antiemetics.  She feels weak.  She does have joint pain after the neulasta injection relieved with Tylenol.  She denies fevers, chills, numbness. She also is having facial dryness and redness that she feels her face is chapped.   MEDICAL HISTORY: Past Medical History  Diagnosis Date  . Chronic kidney disease   . Hx: UTI (urinary tract infection)   . Right shoulder pain   . Right knee pain   . Hypertension     recently started Aldactone   . Hyperlipidemia     but not on meds;diet and exercise controlled  . Dizziness     has been going on for 49months;medical MD aware  . H/O hiatal hernia   . GERD (gastroesophageal reflux disease)     Tums prn  . Hemorrhoids   . Urinary frequency     d/t taking Aldactone  . History of UTI   . Anemia     hx of  . Cataract     immature-not sure of which eye  . Anxiety     r/t updated surgery  . Insomnia     takes Melatoniin daily  . Complication of anesthesia     pt states she is sensitive to meds  . Breast cancer     right  . Skin cancer     ALLERGIES:  is allergic to codeine; keflex; naprosyn; and tussin.  MEDICATIONS:  Current Outpatient Prescriptions  Medication Sig Dispense Refill  . B Complex-C (SUPER B COMPLEX PO) Take 1  each by mouth daily.      . calcium carbonate (TUMS - DOSED IN MG ELEMENTAL CALCIUM) 500 MG chewable tablet Chew 1 tablet by mouth daily.      . Cholecalciferol (VITAMIN D-3 PO) Take 4,000 Units by mouth daily.      . clindamycin (CLINDAGEL) 1 % gel Apply topically 2 (two) times daily.  30 g  0  . dexamethasone (DECADRON) 4 MG tablet Take 2 tablets (8 mg total) by mouth 2 (two) times daily with a meal. Take two times a day the day before Taxotere. Then take two times a day starting the day after chemo for 3 days.  36 tablet  5  . lidocaine-prilocaine (EMLA) cream Apply topically as needed.  30 g  7  .  LORazepam (ATIVAN) 0.5 MG tablet Take 1 tablet (0.5 mg total) by mouth every 6 (six) hours as needed (Nausea or vomiting).  30 tablet  0  . magnesium gluconate (MAGONATE) 500 MG tablet Take 500 mg by mouth 2 (two) times daily.      Marland Kitchen omeprazole (PRILOSEC) 20 MG capsule Take 1 capsule (20 mg total) by mouth daily.  90 capsule  3  . ondansetron (ZOFRAN) 8 MG tablet Take 1 tablet (8 mg total) by mouth 2 (two) times daily. Take two times a day starting the day after chemo for 3 days. Then take two times a day as needed for nausea or vomiting.  30 tablet  1  . PREVIDENT 5000 BOOSTER PLUS 1.1 % PSTE Take 1 application by mouth Twice daily.      . Probiotic Product (PROBIOTIC DAILY PO) Take 1 each by mouth.      . prochlorperazine (COMPAZINE) 10 MG tablet Take 1 tablet (10 mg total) by mouth every 6 (six) hours as needed (Nausea or vomiting).  30 tablet  1  . prochlorperazine (COMPAZINE) 25 MG suppository Place 1 suppository (25 mg total) rectally every 12 (twelve) hours as needed for nausea.  12 suppository  3  . spironolactone (ALDACTONE) 25 MG tablet Take 0.5 tablets (12.5 mg total) by mouth daily.  30 tablet  3   No current facility-administered medications for this visit.    SURGICAL HISTORY:  Past Surgical History  Procedure Laterality Date  . Cyst removed from left breast  1970  . Left wrist surgery   2004    with plate  . Colonoscopy    . Esophagogastroduodenoscopy    . Mastectomy, radical  05/11/2011    right   . Mastectomy modified radical  05/10/2012    Procedure: MASTECTOMY MODIFIED RADICAL;  Surgeon: Robyne Askew, MD;  Location: [facility] OR;  Service: General;  Laterality: Right;  RIGHT MODIFIED RADICAL MASTECTOMY  . Portacath placement  05/10/2012    Procedure: INSERTION PORT-A-CATH;  Surgeon: Robyne Askew, MD;  Location: [facility] OR;  Service: General;  Laterality: Left;  . Wrist fracture surgery  2004    REVIEW OF SYSTEMS:  General: fatigue (+), night sweats (-), fever (-), pain  (-) Lymph: palpable nodes (-) HEENT: vision changes (-), mucositis (-), gum bleeding (-), epistaxis (-) Cardiovascular: chest pain (-), palpitations (-) Pulmonary: shortness of breath (-), dyspnea on exertion (-), cough (-), hemoptysis (-) GI:  Early satiety (-), melena (-), dysphagia (-), nausea/vomiting (+), diarrhea (-) GU: dysuria (-), hematuria (-), incontinence (-) Musculoskeletal: joint swelling (-), joint pain (+), back pain (-) Neuro: weakness (-), numbness (-), headache (-), confusion (-) Skin: Rash (-), lesions (-), dryness (-)  Psych: depression (-), suicidal/homicidal ideation (-), feeling of hopelessness (-)    PHYSICAL EXAMINATION: Blood pressure 123/83, pulse 88, temperature 98.1 F (36.7 C), temperature source Oral, resp. rate 20, height 5\' 4"  (1.626 m), weight 137 lb 12.8 oz (62.506 kg). Body mass index is 23.64 kg/(m^2). General: Patient is a well appearing female in no acute distress HEENT: PERRLA, sclerae anicteric no conjunctival pallor, MMM Neck: supple, no palpable adenopathy Lungs: clear to auscultation bilaterally, no wheezes, rhonchi, or rales Cardiovascular: regular rate rhythm, S1, S2, no murmurs, rubs or gallops Abdomen: Soft, non-tender, non-distended, normoactive bowel sounds, no HSM Extremities: warm and well perfused, no clubbing, cyanosis, or edema Skin: No rashes or lesions, pustular rash on scalp Neuro: Non-focal Breasts: The right breast is status post mastectomy. There is no evidence of local recurrence. The right axilla is benign. The left breast is unremarkable. ECOG PERFORMANCE STATUS: 1   LABORATORY DATA: Lab Results  Component Value Date   WBC 2.6* 08/02/2012   HGB 11.1* 08/02/2012   HCT 32.5* 08/02/2012   MCV 87.4 08/02/2012   PLT 146 08/02/2012      Chemistry      Component Value Date/Time   NA 134* 07/26/2012 1021   NA 138 05/11/2012 0640   K 3.7 07/26/2012 1021   K 4.7 05/11/2012 0640   CL 100 07/26/2012 1021   CL 99 05/11/2012 0640    CO2 23 07/26/2012 1021   CO2 20 05/11/2012 0640   BUN 14.5 07/26/2012 1021   BUN 12 05/11/2012 0640   CREATININE 0.8 07/26/2012 1021   CREATININE 0.88 05/11/2012 0640      Component Value Date/Time   CALCIUM 9.7 07/26/2012 1021   CALCIUM 9.7 05/11/2012 0640   ALKPHOS 83 07/26/2012 1021   AST 20 07/26/2012 1021   ALT 28 07/26/2012 1021   BILITOT 0.41 07/26/2012 1021     ADDITIONAL INFORMATION: CHROMOGENIC IN-SITU HYBRIDIZATION (1C - TUMOR WITH CLIP) Interpretation HER-2/NEU BY CISH - NO AMPLIFICATION OF HER-2 DETECTED. THE RATIO OF HER-2: CEP 17 SIGNALS WAS 1.25. Reference range: Ratio: HER2:CEP17 < 1.8 - gene amplification not observed Ratio: HER2:CEP 17 1.8-2.2 - equivocal result Ratio: HER2:CEP17 > 2.2 - gene amplification observed CHROMOGENIC IN-SITU HYBRIDIZATION (1E - TUMOR WITHOUT CLIP) Interpretation: HER2/NEU BY CISH - SHOWS AMPLIFICATION BY CISH ANALYSIS. THE RATIO OF HER2: CEP 17 SIGNALS WAS 2.83. Reference range: Ratio: HER2:CEP17 < 1.8 gene amplification not observed Ratio: HER2:CEP 17 1.8-2.2 - equivocal result Ratio: HER2:CEP17 > 2.2 - gene amplification observed Pecola Leisure MD Pathologist, Electronic Signature ( Signed 05/15/2012) FINAL DIAGNOSIS Diagnosis Breast, modified radical mastectomy , right, with axillary contents 1 of 4 FINAL for TRICIA, PLEDGER S (SZA14-4) Diagnosis(continued) TUMOR #1 (WITH CLIP): - INVASIVE LOBULAR CARCINOMA, GRADE II, SEE COMMENT. - LYMPHOVASCULAR INVASION IDENTIFIED. - PREVIOUS BIOPSY SITE IDENTIFIED. TUMOR #2 (NO CLIP): - INVASIVE LOBULAR CARCINOMA, GRADE II, SEE COMMENT. - LOBULAR CARCINOMA IN SITU. - LYMPHOVASCULAR INVASION IDENTIFIED. - THREE LYMPH NODES, POSITIVE FOR METASTATIC MAMMARY CARCINOMA (3/7). - TUMOR DEPOSITS ARE 1.7, 1.6, AND 5 CM. - EXTRACAPSULAR TUMOR EXTENSION IDENTIFIED. - TWO LYMPH NODES, POSITIVE FOR ISOLATED TUMOR CELLS. Microscopic Comment BREAST, INVASIVE TUMOR, WITH LYMPH NODE SAMPLING Specimen, including  laterality: Right breast Procedure: Modified radical mastectomy Grade: Tumor 1 and tumor 2: Grade II of III Tubule formation: 3 Nuclear pleomorphism: 2 Mitotic: 1 Tumor size (gross measurement: Tumor #1- 2.3 cm; tumor #2 - 2.4 cm Margins: Invasive, distance to closest margin: Tumor #1 2.7 cm - deep margin; tumor #  2 - 1.6 cm - deep margin Lymphovascular invasion: Present Ductal carcinoma in situ: Absent Grade: N/A Extensive intraductal component: N/A Lobular neoplasia: Both atypical hyperplasia and in situ carcinoma Extent of tumor: Skin: Grossly negative Nipple: Grossly negative Skeletal muscle: Microscopically negative Lymph nodes: # examined: 7 Lymph nodes with metastasis: 3 Isolated tumor cells (< 0.2 mm): two lymph nodes (Slide 1P and 1O) Micrometastasis: (> 0.2 mm and < 2.0 mm): none Macrometastasis: (> 2.0 mm): 1.7 cm , 1.6 cm and 5 cm Extracapsular extension: Present Breast prognostic profile: Tumor #1: Estrogen receptor: Not repeated, previous study demonstrated 98% positivity ([id number]) Progesterone receptor: Not repeated, previous study demonstrated 0% positivity ([id number]) Her 2 neu: Repeated, previous study demonstrated no amplification (1.38) ([id number]) Ki-67: Not repeated, previous study demonstrated 5% proliferation rate ([id number]) Tumor #2: Estrogen receptor: Not repeated, previous study demonstrated 97% positivity ([id number]) Progesterone receptor: Not repeated, previous study demonstrated 0% positivity ([id number]) Her 2 neu: Repeated, previous study demonstrated amplification (3.65) ([id number]) Ki-67: Not repeated, previous study demonstrated 44% proliferation rate ([id number]) 2 of 4 FINAL for ANNASOPHIA, CROCKER (SZA14-4) Microscopic Comment(continued) Non-neoplastic breast: Benign fibrocystic change with usual ductal hyperplasia, sclerosing adenosis, benign radial sclerosing lesion, and microcalcifications in benign ducts and lobules. TNM:  mpT2, pN1a, pMX. Comments: none  RADIOGRAPHIC STUDIES:  Mr Lodema Pilot Contrast  03/23/2012  *RADIOLOGY REPORT*  Clinical Data: New diagnosis breast cancer.  Rule out metastatic disease.  MRI HEAD WITHOUT AND WITH CONTRAST  Technique:  Multiplanar, multiecho pulse sequences of the brain and surrounding structures were obtained according to standard protocol without and with intravenous contrast  Contrast: 12mL MULTIHANCE GADOBENATE DIMEGLUMINE 529 MG/ML IV SOLN  Comparison: None.  Findings: Ventricle size is normal.  Small 4 mm hyperintensities in the cerebral white matter bilaterally do not enhance and are most likely due to chronic microvascular ischemia.  Negative for acute infarct.  Diffusion weighted imaging is normal.  Pituitary is normal in size.  Negative for intracranial hemorrhage.  Following contrast infusion, no enhancing lesions are identified.  Paranasal sinuses are clear.  Vessels at the base of the brain are patent.  IMPRESSION: Negative for metastatic disease.  No acute abnormality.   Original Report Authenticated By: Janeece Riggers, M.D.    Mr Breast Bilateral W Wo Contrast  03/12/2012  *RADIOLOGY REPORT*  Clinical Data: Recently diagnosed invasive mammary carcinoma in the 10 o'clock region of the right breast with a metastatic right axillary lymph node.  BILATERAL BREAST MRI WITH AND WITHOUT CONTRAST  Technique: Multiplanar, multisequence MR images of both breasts were obtained prior to and following the intravenous administration of 12ml of multihance.  Three dimensional images were evaluated at the independent DynaCad workstation.  Comparison:  Mammograms dated 03/05/2012, 03/01/2012, 03/01/2011 and 02/26/2010 from [facility].  Findings: There is a moderate background parenchymal enhancement pattern.  Right breast: 1.  There is a lobulated 2.2 x 1.7 x 1.8 cm mass in the posterior third of the upper outer quadrant of the right breast.  There is a post biopsy clip artifact 1 cm  lateral to the mass. 2.  In the anterior third upper outer quadrant of the right breast there is patchy non mass like enhancement measuring 0.8 x 0.3 x 0.3 cm.  Left breast:  There is no abnormal enhancement the left breast.  There are enlarged right axillary lymph nodes.  The largest right axillary lymph node measures 2.4 cm and is located lateral to the pectoralis minor consistent with level I adenopathy.  There is a 1.6 cm lymph node behind the pectoralis minor consistent with enlarged level II adenopathy.  IMPRESSION:  1.  2.2 cm mass in the posterior third of the upper outer quadrant of the right breast corresponding with the known invasive mammary carcinoma. 2.  Patchy non mass-like enhancement in the anterior third of the upper outer quadrant of the right breast.  MR guided core biopsy is recommended. 3. Enlarged level I and II right axillary adenopathy.  RECOMMENDATION: MR guided core biopsy of the right breast.  THREE-DIMENSIONAL MR IMAGE RENDERING ON INDEPENDENT WORKSTATION:  Three-dimensional MR images were rendered by post-processing of the original MR data on an independent workstation.  The three- dimensional MR images were interpreted, and findings were reported in the accompanying complete MRI report for this study.  BI-RADS CATEGORY 4:  Suspicious abnormality - biopsy should be considered.   Original Report Authenticated By: Baird Lyons, M.D.    Mr Biopsy/wire Localization  03/20/2012  *RADIOLOGY REPORT*  Clinical Data:  Known right breast carcinoma.  Additional area of worrisome enhancement located within the anterior upper outer quadrant of the right breast.  MRI GUIDED VACUUM ASSISTED BIOPSY OF THE RIGHT BREAST WITHOUT AND WITH CONTRAST  Comparison: Previous exams.  Technique: Multiplanar, multisequence MR images of the right breast were obtained prior to and following the intravenous administration of 12 ml of Mulithance.  I met with the patient, and we discussed the procedure of MRI guided  biopsy, including risks, benefits, and alternatives. Specifically, we discussed the risks of infection, bleeding, tissue injury, clip migration, and inadequate sampling.  Informed, written consent was given.  Using sterile technique, 2% Lidocaine, MRI guidance, and a 9 gauge vacuum assisted device, biopsy was performed of the area of enhancement located within the anterior portion of the upper-outer quadrant - right breast using a lateral approach.  At the conclusion of the procedure, a  tissue marker clip was deployed into the biopsy cavity.  IMPRESSION: MRI guided biopsy of the area of enhancement located within the anterior portion of the upper-outer quadrant of the right breast as discussed above. No apparent complications.  BI-RADS CATEGORY 6:  Known biopsy-proven malignancy - appropriate action should be taken.  THREE-DIMENSIONAL MR IMAGE RENDERING ON INDEPENDENT WORKSTATION:  Three-dimensional MR images were rendered by post-processing of the original MR data on an independent workstation.  The three- dimensional MR images were interpreted, and findings were reported in the accompanying complete MRI report for this study.  The pathology associated with the right breast MR guided core biopsy demonstrated invasive mammary carcinoma and in situ mammary carcinoma.  This is concordant with imaging findings.  I have discussed findings with the patient by telephone and answered her questions.  The patient states that biopsy site is clean and dry without hematoma formation or signs of infection.  Post biopsy wound care instructions were reviewed with the patient.  Follow-up will be with Dr. Carolynne Edouard and Dr. Welton Flakes regards her treatment plan.  The patient was encouraged to call the Breast Center for additional questions or concerns   Original Report Authenticated By: Rolla Plate, M.D.    Nm Pet Image Initial (pi) Skull Base To Thigh  03/22/2012  *RADIOLOGY REPORT*  Clinical Data: Initial treatment strategy for high  risk right breast cancer.  NUCLEAR MEDICINE PET SKULL BASE TO THIGH  Fasting Blood Glucose:  100  Technique:  18.6 mCi F-18 FDG was injected intravenously. CT data was obtained and used for attenuation correction and anatomic localization only.  (  This was not acquired as a diagnostic CT examination.) Additional exam technical data entered on technologist worksheet.  Comparison:  MRI breast dated 03/12/2012  Findings:  Neck: No hypermetabolic lymph nodes in the neck.  Chest:  Vague hypermetabolism in the upper/central right breast, max SUV 2.7.  Additional focal hypermetabolism in the upper/outer right breast, max SUV 2.3 (PET image 105).  These findings correspond to biopsy-proven primary breast carcinoma.  1.3 cm short axis right subpectoral lymph node (series 2/image 66), max SUV 3.1.  Right axillary lymphadenopathy measuring up to 1.6 cm short axis (series 2/image 88), max SUV 4.8).  No hypermetabolic mediastinal or hilar nodes.  No suspicious pulmonary nodules on the CT scan.  Abdomen/Pelvis:  No abnormal hypermetabolic activity within the liver, pancreas, adrenal glands, or spleen.  No hypermetabolic lymph nodes in the abdomen or pelvis.  Skeleton:  No focal hypermetabolic activity to suggest skeletal metastasis.  IMPRESSION: Two foci of hypermetabolism in the upper right breast, max SUV 2.7, corresponding to biopsy-proven primary breast carcinoma.  Associated right subpectoral and axillary nodal metastases, max SUV 4.8.  No evidence of distant metastases.   Original Report Authenticated By: Charline Bills, M.D.    Mm Digital Diagnostic Unilat R  03/20/2012  *RADIOLOGY REPORT*  Clinical Data:  Post right breast MR guided core biopsy.  DIGITAL DIAGNOSTIC RIGHT BREAST MAMMOGRAM  Comparison:  03/05/2012, 03/01/2012, 03/01/2011, 02/26/2010 mammograms from [facility] imaging.  Findings:  Films are performed following MR guided biopsy of the anterior portion of the upper-outer quadrant of the right breast. The  hourglass shaped clip placed during the MR guided core biopsy appears in appropriate position.  IMPRESSION: Appropriate positioning of clip following right breast MR guided core biopsy.   Original Report Authenticated By: Rolla Plate, M.D.     ASSESSMENT: 67 year old [place], [place] woman  #1  status post mastectomy with right axillary lymph node dissection 05/10/2012 for an mpT2 N1, stage stage IIB invasive lobular breast cancer, stage IIB, ER positive, HER-2/neu positive, with an Mib-1 between 5% and 44%  #2 receiving adjuvant chemotherapy consisting of Taxotere, carboplatinum and Herceptin given every 21 days for total of 6 cycles of Taxotere carboplatinum and weekly Herceptin beginning on 06/14/12.  #3 Herceptin to be continued to total one year; most recent echo 03/28/2012  PLAN:   1. Ms. Martian is doing well.  She will proceed with Herceptin today.  I asked for a repeat echo with Dr. Gala Romney.  She is neutropenic.  She will have her Cipro refilled.  We reviewed her neutropenic precautions in detail.    2.  Her blurred vision is the same with the decrease of steroids.      3. She will continue to take Super B complex daily.  She doesn't have any more numbness.    4. Her folliculitis is improved, she is no longer using the topical clindamycin.   All questions were answered. The patient knows to call the clinic with any problems, questions or concerns. We can certainly see the patient much sooner if necessary.   I spent 25 minutes counseling the patient face to face. The total time spent in the appointment was 30 minutes.  Cherie Ouch Lyn Hollingshead, NP Medical Oncology [facility] Phone: [phone] Augustin Schooling 08/02/2012, 11:06 AM

## 2012-08-04 ENCOUNTER — Telehealth: Payer: Self-pay | Admitting: Oncology

## 2012-08-04 NOTE — Telephone Encounter (Signed)
appt is already scheduled for the echo and with dr benshimon for may 2014.

## 2012-08-06 ENCOUNTER — Telehealth: Payer: Self-pay

## 2012-08-06 NOTE — Telephone Encounter (Signed)
LMOVM regarding pts earlier call about redness to face. Left instructions to call office so we could schedule her to come in for appt. Awaiting pts return call. TMB

## 2012-08-07 ENCOUNTER — Telehealth: Payer: Self-pay

## 2012-08-07 NOTE — Telephone Encounter (Signed)
Spoke with pt regarding her earlier call about redness/swelling to face. Pt states that swelling has improved and she would like to wait to come in until her scheduled appt on 4/3. Pt instructed to call the office with any problems or questions. Pt voiced understanding. TMB

## 2012-08-09 ENCOUNTER — Encounter: Payer: Self-pay | Admitting: Adult Health

## 2012-08-09 ENCOUNTER — Other Ambulatory Visit (HOSPITAL_BASED_OUTPATIENT_CLINIC_OR_DEPARTMENT_OTHER): Payer: Medicare Other | Admitting: Lab

## 2012-08-09 ENCOUNTER — Ambulatory Visit (HOSPITAL_BASED_OUTPATIENT_CLINIC_OR_DEPARTMENT_OTHER): Payer: Medicare Other

## 2012-08-09 ENCOUNTER — Ambulatory Visit (HOSPITAL_BASED_OUTPATIENT_CLINIC_OR_DEPARTMENT_OTHER): Payer: Medicare Other | Admitting: Adult Health

## 2012-08-09 VITALS — BP 143/79 | HR 82 | Temp 98.2°F | Resp 20 | Ht 64.0 in | Wt 141.3 lb

## 2012-08-09 DIAGNOSIS — R21 Rash and other nonspecific skin eruption: Secondary | ICD-10-CM

## 2012-08-09 DIAGNOSIS — Z5112 Encounter for antineoplastic immunotherapy: Secondary | ICD-10-CM

## 2012-08-09 DIAGNOSIS — C50411 Malignant neoplasm of upper-outer quadrant of right female breast: Secondary | ICD-10-CM

## 2012-08-09 DIAGNOSIS — C50419 Malignant neoplasm of upper-outer quadrant of unspecified female breast: Secondary | ICD-10-CM

## 2012-08-09 DIAGNOSIS — C773 Secondary and unspecified malignant neoplasm of axilla and upper limb lymph nodes: Secondary | ICD-10-CM

## 2012-08-09 DIAGNOSIS — H538 Other visual disturbances: Secondary | ICD-10-CM

## 2012-08-09 LAB — CBC WITH DIFFERENTIAL/PLATELET
BASO%: 0.2 % (ref 0.0–2.0)
Basophils Absolute: 0 10*3/uL (ref 0.0–0.1)
EOS%: 0.2 % (ref 0.0–7.0)
Eosinophils Absolute: 0 10*3/uL (ref 0.0–0.5)
HCT: 30.5 % — ABNORMAL LOW (ref 34.8–46.6)
HGB: 10.4 g/dL — ABNORMAL LOW (ref 11.6–15.9)
LYMPH%: 17.3 % (ref 14.0–49.7)
MCH: 30.1 pg (ref 25.1–34.0)
MCHC: 34.1 g/dL (ref 31.5–36.0)
MCV: 88.4 fL (ref 79.5–101.0)
MONO#: 0.6 10*3/uL (ref 0.1–0.9)
MONO%: 7.1 % (ref 0.0–14.0)
NEUT#: 6.3 10*3/uL (ref 1.5–6.5)
NEUT%: 75.2 % (ref 38.4–76.8)
Platelets: 64 10*3/uL — ABNORMAL LOW (ref 145–400)
RBC: 3.45 10*6/uL — ABNORMAL LOW (ref 3.70–5.45)
RDW: 17.4 % — ABNORMAL HIGH (ref 11.2–14.5)
WBC: 8.4 10*3/uL (ref 3.9–10.3)
lymph#: 1.5 10*3/uL (ref 0.9–3.3)

## 2012-08-09 LAB — COMPREHENSIVE METABOLIC PANEL (CC13)
ALT: 20 U/L (ref 0–55)
AST: 23 U/L (ref 5–34)
Albumin: 3.4 g/dL — ABNORMAL LOW (ref 3.5–5.0)
Alkaline Phosphatase: 91 U/L (ref 40–150)
BUN: 11.2 mg/dL (ref 7.0–26.0)
CO2: 24 mEq/L (ref 22–29)
Calcium: 8.8 mg/dL (ref 8.4–10.4)
Chloride: 100 mEq/L (ref 98–107)
Creatinine: 0.9 mg/dL (ref 0.6–1.1)
Glucose: 140 mg/dl — ABNORMAL HIGH (ref 70–99)
Potassium: 3.7 mEq/L (ref 3.5–5.1)
Sodium: 133 mEq/L — ABNORMAL LOW (ref 136–145)
Total Bilirubin: 0.32 mg/dL (ref 0.20–1.20)
Total Protein: 6.5 g/dL (ref 6.4–8.3)

## 2012-08-09 MED ORDER — HEPARIN SOD (PORK) LOCK FLUSH 100 UNIT/ML IV SOLN
500.0000 [IU] | Freq: Once | INTRAVENOUS | Status: AC | PRN
  Administered 2012-08-09: 500 [IU]
  Filled 2012-08-09: qty 5

## 2012-08-09 MED ORDER — TRASTUZUMAB CHEMO INJECTION 440 MG
2.0000 mg/kg | Freq: Once | INTRAVENOUS | Status: AC
  Administered 2012-08-09: 126 mg via INTRAVENOUS
  Filled 2012-08-09: qty 6

## 2012-08-09 MED ORDER — DIPHENHYDRAMINE HCL 25 MG PO CAPS
50.0000 mg | ORAL_CAPSULE | Freq: Once | ORAL | Status: AC
  Administered 2012-08-09: 50 mg via ORAL

## 2012-08-09 MED ORDER — SODIUM CHLORIDE 0.9 % IJ SOLN
10.0000 mL | INTRAMUSCULAR | Status: DC | PRN
  Administered 2012-08-09: 10 mL
  Filled 2012-08-09: qty 10

## 2012-08-09 MED ORDER — SODIUM CHLORIDE 0.9 % IV SOLN
Freq: Once | INTRAVENOUS | Status: AC
  Administered 2012-08-09: 15:00:00 via INTRAVENOUS

## 2012-08-09 MED ORDER — ACETAMINOPHEN 325 MG PO TABS
650.0000 mg | ORAL_TABLET | Freq: Once | ORAL | Status: AC
  Administered 2012-08-09: 650 mg via ORAL

## 2012-08-09 NOTE — Patient Instructions (Addendum)
[facility] Discharge Instructions for Patients Receiving Chemotherapy  Today you received the following chemotherapy agents Herceptin.  To help prevent nausea and vomiting after your treatment, we encourage you to take your nausea medication as prescribed.    If you develop nausea and vomiting that is not controlled by your nausea medication, call the clinic. If it is after clinic hours your family physician or the after hours number for the clinic or go to the Emergency Department.   BELOW ARE SYMPTOMS THAT SHOULD BE REPORTED IMMEDIATELY:  *FEVER GREATER THAN 100.5 F  *CHILLS WITH OR WITHOUT FEVER  NAUSEA AND VOMITING THAT IS NOT CONTROLLED WITH YOUR NAUSEA MEDICATION  *UNUSUAL SHORTNESS OF BREATH  *UNUSUAL BRUISING OR BLEEDING  TENDERNESS IN MOUTH AND THROAT WITH OR WITHOUT PRESENCE OF ULCERS  *URINARY PROBLEMS  *BOWEL PROBLEMS  UNUSUAL RASH Items with * indicate a potential emergency and should be followed up as soon as possible.   Please let the nurse know about any problems that you may have experienced. Feel free to call the clinic you have any questions or concerns. The clinic phone number is [phone].   I have been informed and understand all the instructions given to me. I know to contact the clinic, my physician, or go to the Emergency Department if any problems should occur. I do not have any questions at this time, but understand that I may call the clinic during office hours   should I have any questions or need assistance in obtaining follow up care.    __________________________________________  _____________  __________ Signature of Patient or Authorized Representative            Date                   Time    __________________________________________ Nurse's Signature    

## 2012-08-09 NOTE — Patient Instructions (Addendum)
Try Aquaphor for the face.  You can get sunscreen at [employer] store at [facility].  Please call us if you have any questions or concerns.     Proceed with Herceptin.

## 2012-08-09 NOTE — Progress Notes (Signed)
OFFICE PROGRESS NOTE  CC  [name], MD [address] [place] [address] Dr. Chevis Pretty Dr. Chipper Herb  DIAGNOSIS: 67 year old female with invasive lobular breast cancer, stage IIB, receiving adjuvant chemotherapy  PRIOR THERAPY:  #1 patient was originally seen in the multidisciplinary breast clinic on 03/14/2012 for new diagnosis of invasive ductal carcinoma that was HER-2 positive node-positive, ER positive PR negative with Ki-67 of 44%  #2 on MRI patient also had an anterior breast mass that was to be biopsied to decide whether or not she should receive neoadjuvant chemotherapy or adjuvant chemotherapy. Patient's mass was biopsied and it was positive for a malignancy that was HER-2/neu negative.  #3 patient is now status post right mastectomy with axillary lymph node dissection with the final pathology revealing the largest tumor measuring 5 cm, 1.7 cm and 1.6 cm. Tumor was ER positive PR negative HER-2/neu positive with a Ki-67 of 44% 3 of 7 lymph nodes were positive for metastatic disease.  #4 patient will proceed with adjuvant chemotherapy and Herceptin. Chemotherapy will consist of Taxotere carboplatinum given every 21 days with weekly Herceptin. Total of 6 cycles of Taxotere carboplatin will be administered starting on 06/14/2012. Once patient completes her chemotherapy then she will go on to receive radiation therapy along with Herceptin with the Herceptin then being converted to every 3 weeks to finish a total of one year. Because patient's tumor is ER positive she will also be given antiestrogen therapy adjuvantly.  CURRENT THERAPY:Cycle 3 day 15 of TCH  INTERVAL HISTORY: Ms. Borromeo is doing well today. She is here for follow up after her third cycle of TCH.  She's doing essentially well today.  She had some mild redness on her face last week, however it became increasingly worse this past weekend.  She has dryness and a burning feeling that she's been putting  vaseline on daily.  She hasn't changed any makeup, creams, or anything else.  Otherwise, she's doing well and a 10 point ROS is neg.   MEDICAL HISTORY: Past Medical History  Diagnosis Date  . Chronic kidney disease   . Hx: UTI (urinary tract infection)   . Right shoulder pain   . Right knee pain   . Hypertension     recently started Aldactone   . Hyperlipidemia     but not on meds;diet and exercise controlled  . Dizziness     has been going on for 17months;medical MD aware  . H/O hiatal hernia   . GERD (gastroesophageal reflux disease)     Tums prn  . Hemorrhoids   . Urinary frequency     d/t taking Aldactone  . History of UTI   . Anemia     hx of  . Cataract     immature-not sure of which eye  . Anxiety     r/t updated surgery  . Insomnia     takes Melatoniin daily  . Complication of anesthesia     pt states she is sensitive to meds  . Breast cancer     right  . Skin cancer     ALLERGIES:  is allergic to codeine; keflex; naprosyn; and tussin.  MEDICATIONS:  Current Outpatient Prescriptions  Medication Sig Dispense Refill  . B Complex-C (SUPER B COMPLEX PO) Take 1 each by mouth daily.      . calcium carbonate (TUMS - DOSED IN MG ELEMENTAL CALCIUM) 500 MG chewable tablet Chew 1 tablet by mouth daily.      Marland Kitchen  Cholecalciferol (VITAMIN D-3 PO) Take 4,000 Units by mouth daily.      . ciprofloxacin (CIPRO) 500 MG tablet       . clindamycin (CLINDAGEL) 1 % gel Apply topically 2 (two) times daily.  30 g  0  . dexamethasone (DECADRON) 4 MG tablet Take 2 tablets (8 mg total) by mouth 2 (two) times daily with a meal. Take two times a day the day before Taxotere. Then take two times a day starting the day after chemo for 3 days.  36 tablet  5  . lidocaine-prilocaine (EMLA) cream Apply topically as needed.  30 g  7  . Loratadine (CLARITIN PO) Take 1 each by mouth daily.      Marland Kitchen LORazepam (ATIVAN) 0.5 MG tablet Take 1 tablet (0.5 mg total) by mouth every 6 (six) hours as needed  (Nausea or vomiting).  30 tablet  0  . magnesium gluconate (MAGONATE) 500 MG tablet Take 500 mg by mouth 2 (two) times daily.      Marland Kitchen omeprazole (PRILOSEC) 20 MG capsule Take 1 capsule (20 mg total) by mouth daily.  90 capsule  3  . ondansetron (ZOFRAN) 8 MG tablet Take 1 tablet (8 mg total) by mouth 2 (two) times daily. Take two times a day starting the day after chemo for 3 days. Then take two times a day as needed for nausea or vomiting.  30 tablet  1  . PREVIDENT 5000 BOOSTER PLUS 1.1 % PSTE Take 1 application by mouth Twice daily.      . Probiotic Product (PROBIOTIC DAILY PO) Take 1 each by mouth.      . prochlorperazine (COMPAZINE) 10 MG tablet Take 1 tablet (10 mg total) by mouth every 6 (six) hours as needed (Nausea or vomiting).  30 tablet  1  . prochlorperazine (COMPAZINE) 25 MG suppository Place 1 suppository (25 mg total) rectally every 12 (twelve) hours as needed for nausea.  12 suppository  3  . spironolactone (ALDACTONE) 25 MG tablet Take 0.5 tablets (12.5 mg total) by mouth daily.  30 tablet  3   No current facility-administered medications for this visit.    SURGICAL HISTORY:  Past Surgical History  Procedure Laterality Date  . Cyst removed from left breast  1970  . Left wrist surgery   2004    with plate  . Colonoscopy    . Esophagogastroduodenoscopy    . Mastectomy, radical  05/11/2011    right   . Mastectomy modified radical  05/10/2012    Procedure: MASTECTOMY MODIFIED RADICAL;  Surgeon: Robyne Askew, MD;  Location: [facility] OR;  Service: General;  Laterality: Right;  RIGHT MODIFIED RADICAL MASTECTOMY  . Portacath placement  05/10/2012    Procedure: INSERTION PORT-A-CATH;  Surgeon: Robyne Askew, MD;  Location: [facility] OR;  Service: General;  Laterality: Left;  . Wrist fracture surgery  2004    REVIEW OF SYSTEMS:  General: fatigue (+), night sweats (-), fever (-), pain (-) Lymph: palpable nodes (-) HEENT: vision changes (-), mucositis (-), gum bleeding (-), epistaxis  (-) Cardiovascular: chest pain (-), palpitations (-) Pulmonary: shortness of breath (-), dyspnea on exertion (-), cough (-), hemoptysis (-) GI:  Early satiety (-), melena (-), dysphagia (-), nausea/vomiting (+), diarrhea (-) GU: dysuria (-), hematuria (-), incontinence (-) Musculoskeletal: joint swelling (-), joint pain (+), back pain (-) Neuro: weakness (-), numbness (-), headache (-), confusion (-) Skin: Rash (-), lesions (-), dryness (-) Psych: depression (-), suicidal/homicidal ideation (-), feeling of  hopelessness (-)    PHYSICAL EXAMINATION: Blood pressure 143/79, pulse 82, temperature 98.2 F (36.8 C), temperature source Oral, resp. rate 20, height 5\' 4"  (1.626 m), weight 141 lb 4.8 oz (64.093 kg). Body mass index is 24.24 kg/(m^2). General: Patient is a well appearing female in no acute distress HEENT: PERRLA, sclerae anicteric no conjunctival pallor, MMM Neck: supple, no palpable adenopathy Lungs: clear to auscultation bilaterally, no wheezes, rhonchi, or rales Cardiovascular: regular rate rhythm, S1, S2, no murmurs, rubs or gallops Abdomen: Soft, non-tender, non-distended, normoactive bowel sounds, no HSM Extremities: warm and well perfused, no clubbing, cyanosis, or edema Skin: No rashes or lesions, pustular rash on scalp Neuro: Non-focal Breasts: The right breast is status post mastectomy. There is no evidence of local recurrence. The right axilla is benign. The left breast is unremarkable. ECOG PERFORMANCE STATUS: 1   LABORATORY DATA: Lab Results  Component Value Date   WBC 8.4 08/09/2012   HGB 10.4* 08/09/2012   HCT 30.5* 08/09/2012   MCV 88.4 08/09/2012   PLT 64* 08/09/2012      Chemistry      Component Value Date/Time   NA 129* 08/02/2012 1022   NA 138 05/11/2012 0640   K 3.6 08/02/2012 1022   K 4.7 05/11/2012 0640   CL 95* 08/02/2012 1022   CL 99 05/11/2012 0640   CO2 26 08/02/2012 1022   CO2 20 05/11/2012 0640   BUN 13.3 08/02/2012 1022   BUN 12 05/11/2012 0640   CREATININE  0.8 08/02/2012 1022   CREATININE 0.88 05/11/2012 0640      Component Value Date/Time   CALCIUM 8.9 08/02/2012 1022   CALCIUM 9.7 05/11/2012 0640   ALKPHOS 97 08/02/2012 1022   AST 18 08/02/2012 1022   ALT 22 08/02/2012 1022   BILITOT 0.62 08/02/2012 1022     ADDITIONAL INFORMATION: CHROMOGENIC IN-SITU HYBRIDIZATION (1C - TUMOR WITH CLIP) Interpretation HER-2/NEU BY CISH - NO AMPLIFICATION OF HER-2 DETECTED. THE RATIO OF HER-2: CEP 17 SIGNALS WAS 1.25. Reference range: Ratio: HER2:CEP17 < 1.8 - gene amplification not observed Ratio: HER2:CEP 17 1.8-2.2 - equivocal result Ratio: HER2:CEP17 > 2.2 - gene amplification observed CHROMOGENIC IN-SITU HYBRIDIZATION (1E - TUMOR WITHOUT CLIP) Interpretation: HER2/NEU BY CISH - SHOWS AMPLIFICATION BY CISH ANALYSIS. THE RATIO OF HER2: CEP 17 SIGNALS WAS 2.83. Reference range: Ratio: HER2:CEP17 < 1.8 gene amplification not observed Ratio: HER2:CEP 17 1.8-2.2 - equivocal result Ratio: HER2:CEP17 > 2.2 - gene amplification observed Pecola Leisure MD Pathologist, Electronic Signature ( Signed 05/15/2012) FINAL DIAGNOSIS Diagnosis Breast, modified radical mastectomy , right, with axillary contents 1 of 4 FINAL for AUBRIONNA, ISTRE S ([id number]) Diagnosis(continued) TUMOR #1 (WITH CLIP): - INVASIVE LOBULAR CARCINOMA, GRADE II, SEE COMMENT. - LYMPHOVASCULAR INVASION IDENTIFIED. - PREVIOUS BIOPSY SITE IDENTIFIED. TUMOR #2 (NO CLIP): - INVASIVE LOBULAR CARCINOMA, GRADE II, SEE COMMENT. - LOBULAR CARCINOMA IN SITU. - LYMPHOVASCULAR INVASION IDENTIFIED. - THREE LYMPH NODES, POSITIVE FOR METASTATIC MAMMARY CARCINOMA (3/7). - TUMOR DEPOSITS ARE 1.7, 1.6, AND 5 CM. - EXTRACAPSULAR TUMOR EXTENSION IDENTIFIED. - TWO LYMPH NODES, POSITIVE FOR ISOLATED TUMOR CELLS. Microscopic Comment BREAST, INVASIVE TUMOR, WITH LYMPH NODE SAMPLING Specimen, including laterality: Right breast Procedure: Modified radical mastectomy Grade: Tumor 1 and tumor 2: Grade II of  III Tubule formation: 3 Nuclear pleomorphism: 2 Mitotic: 1 Tumor size (gross measurement: Tumor #1- 2.3 cm; tumor #2 - 2.4 cm Margins: Invasive, distance to closest margin: Tumor #1 2.7 cm - deep margin; tumor #2 - 1.6 cm - deep margin Lymphovascular  invasion: Present Ductal carcinoma in situ: Absent Grade: N/A Extensive intraductal component: N/A Lobular neoplasia: Both atypical hyperplasia and in situ carcinoma Extent of tumor: Skin: Grossly negative Nipple: Grossly negative Skeletal muscle: Microscopically negative Lymph nodes: # examined: 7 Lymph nodes with metastasis: 3 Isolated tumor cells (< 0.2 mm): two lymph nodes (Slide 1P and 1O) Micrometastasis: (> 0.2 mm and < 2.0 mm): none Macrometastasis: (> 2.0 mm): 1.7 cm , 1.6 cm and 5 cm Extracapsular extension: Present Breast prognostic profile: Tumor #1: Estrogen receptor: Not repeated, previous study demonstrated 98% positivity ([id number]) Progesterone receptor: Not repeated, previous study demonstrated 0% positivity ([id number]) Her 2 neu: Repeated, previous study demonstrated no amplification (1.38) ([id number]) Ki-67: Not repeated, previous study demonstrated 5% proliferation rate ([id number]) Tumor #2: Estrogen receptor: Not repeated, previous study demonstrated 97% positivity ([id number]) Progesterone receptor: Not repeated, previous study demonstrated 0% positivity ([id number]) Her 2 neu: Repeated, previous study demonstrated amplification (3.65) ([id number]) Ki-67: Not repeated, previous study demonstrated 44% proliferation rate ([id number]) 2 of 4 FINAL for THEODORE, RAHRIG ([id number]) Microscopic Comment(continued) Non-neoplastic breast: Benign fibrocystic change with usual ductal hyperplasia, sclerosing adenosis, benign radial sclerosing lesion, and microcalcifications in benign ducts and lobules. TNM: mpT2, pN1a, pMX. Comments: none  RADIOGRAPHIC STUDIES:  Mr Lodema Pilot Contrast  03/23/2012   *RADIOLOGY REPORT*  Clinical Data: New diagnosis breast cancer.  Rule out metastatic disease.  MRI HEAD WITHOUT AND WITH CONTRAST  Technique:  Multiplanar, multiecho pulse sequences of the brain and surrounding structures were obtained according to standard protocol without and with intravenous contrast  Contrast: 12mL MULTIHANCE GADOBENATE DIMEGLUMINE 529 MG/ML IV SOLN  Comparison: None.  Findings: Ventricle size is normal.  Small 4 mm hyperintensities in the cerebral white matter bilaterally do not enhance and are most likely due to chronic microvascular ischemia.  Negative for acute infarct.  Diffusion weighted imaging is normal.  Pituitary is normal in size.  Negative for intracranial hemorrhage.  Following contrast infusion, no enhancing lesions are identified.  Paranasal sinuses are clear.  Vessels at the base of the brain are patent.  IMPRESSION: Negative for metastatic disease.  No acute abnormality.   Original Report Authenticated By: Janeece Riggers, M.D.    Mr Breast Bilateral W Wo Contrast  03/12/2012  *RADIOLOGY REPORT*  Clinical Data: Recently diagnosed invasive mammary carcinoma in the 10 o'clock region of the right breast with a metastatic right axillary lymph node.  BILATERAL BREAST MRI WITH AND WITHOUT CONTRAST  Technique: Multiplanar, multisequence MR images of both breasts were obtained prior to and following the intravenous administration of 12ml of multihance.  Three dimensional images were evaluated at the independent DynaCad workstation.  Comparison:  Mammograms dated 03/05/2012, 03/01/2012, 03/01/2011 and 02/26/2010 from [facility].  Findings: There is a moderate background parenchymal enhancement pattern.  Right breast: 1.  There is a lobulated 2.2 x 1.7 x 1.8 cm mass in the posterior third of the upper outer quadrant of the right breast.  There is a post biopsy clip artifact 1 cm lateral to the mass. 2.  In the anterior third upper outer quadrant of the right breast there is  patchy non mass like enhancement measuring 0.8 x 0.3 x 0.3 cm.  Left breast:  There is no abnormal enhancement the left breast.  There are enlarged right axillary lymph nodes.  The largest right axillary lymph node measures 2.4 cm and is located lateral to the pectoralis minor consistent with level I adenopathy.  There is a 1.6 cm lymph node behind  the pectoralis minor consistent with enlarged level II adenopathy.  IMPRESSION:  1.  2.2 cm mass in the posterior third of the upper outer quadrant of the right breast corresponding with the known invasive mammary carcinoma. 2.  Patchy non mass-like enhancement in the anterior third of the upper outer quadrant of the right breast.  MR guided core biopsy is recommended. 3. Enlarged level I and II right axillary adenopathy.  RECOMMENDATION: MR guided core biopsy of the right breast.  THREE-DIMENSIONAL MR IMAGE RENDERING ON INDEPENDENT WORKSTATION:  Three-dimensional MR images were rendered by post-processing of the original MR data on an independent workstation.  The three- dimensional MR images were interpreted, and findings were reported in the accompanying complete MRI report for this study.  BI-RADS CATEGORY 4:  Suspicious abnormality - biopsy should be considered.   Original Report Authenticated By: Baird Lyons, M.D.    Mr Biopsy/wire Localization  03/20/2012  *RADIOLOGY REPORT*  Clinical Data:  Known right breast carcinoma.  Additional area of worrisome enhancement located within the anterior upper outer quadrant of the right breast.  MRI GUIDED VACUUM ASSISTED BIOPSY OF THE RIGHT BREAST WITHOUT AND WITH CONTRAST  Comparison: Previous exams.  Technique: Multiplanar, multisequence MR images of the right breast were obtained prior to and following the intravenous administration of 12 ml of Mulithance.  I met with the patient, and we discussed the procedure of MRI guided biopsy, including risks, benefits, and alternatives. Specifically, we discussed the risks of  infection, bleeding, tissue injury, clip migration, and inadequate sampling.  Informed, written consent was given.  Using sterile technique, 2% Lidocaine, MRI guidance, and a 9 gauge vacuum assisted device, biopsy was performed of the area of enhancement located within the anterior portion of the upper-outer quadrant - right breast using a lateral approach.  At the conclusion of the procedure, a  tissue marker clip was deployed into the biopsy cavity.  IMPRESSION: MRI guided biopsy of the area of enhancement located within the anterior portion of the upper-outer quadrant of the right breast as discussed above. No apparent complications.  BI-RADS CATEGORY 6:  Known biopsy-proven malignancy - appropriate action should be taken.  THREE-DIMENSIONAL MR IMAGE RENDERING ON INDEPENDENT WORKSTATION:  Three-dimensional MR images were rendered by post-processing of the original MR data on an independent workstation.  The three- dimensional MR images were interpreted, and findings were reported in the accompanying complete MRI report for this study.  The pathology associated with the right breast MR guided core biopsy demonstrated invasive mammary carcinoma and in situ mammary carcinoma.  This is concordant with imaging findings.  I have discussed findings with the patient by telephone and answered her questions.  The patient states that biopsy site is clean and dry without hematoma formation or signs of infection.  Post biopsy wound care instructions were reviewed with the patient.  Follow-up will be with Dr. Carolynne Edouard and Dr. Welton Flakes regards her treatment plan.  The patient was encouraged to call the Breast Center for additional questions or concerns   Original Report Authenticated By: Rolla Plate, M.D.    Nm Pet Image Initial (pi) Skull Base To Thigh  03/22/2012  *RADIOLOGY REPORT*  Clinical Data: Initial treatment strategy for high risk right breast cancer.  NUCLEAR MEDICINE PET SKULL BASE TO THIGH  Fasting Blood Glucose:   100  Technique:  18.6 mCi F-18 FDG was injected intravenously. CT data was obtained and used for attenuation correction and anatomic localization only.  (This was not acquired as a diagnostic CT  examination.) Additional exam technical data entered on technologist worksheet.  Comparison:  MRI breast dated 03/12/2012  Findings:  Neck: No hypermetabolic lymph nodes in the neck.  Chest:  Vague hypermetabolism in the upper/central right breast, max SUV 2.7.  Additional focal hypermetabolism in the upper/outer right breast, max SUV 2.3 (PET image 105).  These findings correspond to biopsy-proven primary breast carcinoma.  1.3 cm short axis right subpectoral lymph node (series 2/image 66), max SUV 3.1.  Right axillary lymphadenopathy measuring up to 1.6 cm short axis (series 2/image 88), max SUV 4.8).  No hypermetabolic mediastinal or hilar nodes.  No suspicious pulmonary nodules on the CT scan.  Abdomen/Pelvis:  No abnormal hypermetabolic activity within the liver, pancreas, adrenal glands, or spleen.  No hypermetabolic lymph nodes in the abdomen or pelvis.  Skeleton:  No focal hypermetabolic activity to suggest skeletal metastasis.  IMPRESSION: Two foci of hypermetabolism in the upper right breast, max SUV 2.7, corresponding to biopsy-proven primary breast carcinoma.  Associated right subpectoral and axillary nodal metastases, max SUV 4.8.  No evidence of distant metastases.   Original Report Authenticated By: Charline Bills, M.D.    Mm Digital Diagnostic Unilat R  03/20/2012  *RADIOLOGY REPORT*  Clinical Data:  Post right breast MR guided core biopsy.  DIGITAL DIAGNOSTIC RIGHT BREAST MAMMOGRAM  Comparison:  03/05/2012, 03/01/2012, 03/01/2011, 02/26/2010 [facility]ograms [facility].  Findings:  Films are performed following MR guided biopsy of the anterior portion of the upper-outer quadrant of the right breast. The hourglass shaped clip placed during the MR guided core biopsy appears in appropriate position.   IMPRESSION: Appropriate positioning of clip following right breast MR guided core biopsy.   Original Report Authenticated By: Rolla Plate, M.D.     ASSESSMENT: 67 year old [place], [place] woman  #1  status post mastectomy with right axillary lymph node dissection 05/10/2012 for an mpT2 N1, stage stage IIB invasive lobular breast cancer, stage IIB, ER positive, HER-2/neu positive, with an Mib-1 between 5% and 44%  #2 receiving adjuvant chemotherapy consisting of Taxotere, carboplatinum and Herceptin given every 21 days for total of 6 cycles of Taxotere carboplatinum and weekly Herceptin beginning on 06/14/12.  #3 Herceptin to be continued to total one year; most recent echo 03/28/2012  PLAN:   1. Ms. Loney is doing well.  She will proceed with Herceptin today.  2.  Her blurred vision is the same with the decrease of steroids.      3. She will continue to take Super B complex daily.  She doesn't have any more numbness.    4. Her face rash is likely due to dry skin.  She will get Aquaphor and apply to her face.  She will abstain from using makeup.  She will also get sunscreen from the [facility] store and try it as other sunscreens make her face burn.   All questions were answered. The patient knows to call the clinic with any problems, questions or concerns. We can certainly see the patient much sooner if necessary.   I spent 25 minutes counseling the patient face to face. The total time spent in the appointment was 30 minutes.  Cherie Ouch Lyn Hollingshead, NP Medical Oncology [facility] Phone: [phone] Augustin Schooling 08/09/2012, 2:20 PM

## 2012-08-13 NOTE — Progress Notes (Signed)
OFFICE PROGRESS NOTE  CC  Heather Bradford, MD [address] [place] [address] Dr. Chevis Pretty Dr. Chipper Herb  DIAGNOSIS: 67 year old female with invasive lobular breast cancer, stage IIB, receiving adjuvant chemotherapy  PRIOR THERAPY:  #1 patient was originally seen in the multidisciplinary breast clinic on 03/14/2012 for new diagnosis of invasive ductal carcinoma that was HER-2 positive node-positive, ER positive PR negative with Ki-67 of 44%  #2 on MRI patient also had an anterior breast mass that was to be biopsied to decide whether or not she should receive neoadjuvant chemotherapy or adjuvant chemotherapy. Patient's mass was biopsied and it was positive for a malignancy that was HER-2/neu negative.  #3 patient is now status post right mastectomy with axillary lymph node dissection with the final pathology revealing the largest tumor measuring 5 cm, 1.7 cm and 1.6 cm. Tumor was ER positive PR negative HER-2/neu positive with a Ki-67 of 44% 3 of 7 lymph nodes were positive for metastatic disease.  #4 patient will proceed with adjuvant chemotherapy and Herceptin. Chemotherapy will consist of Taxotere carboplatinum given every 21 days with weekly Herceptin. Total of 6 cycles of Taxotere carboplatin will be administered starting on 06/14/2012. Once patient completes her chemotherapy then she will go on to receive radiation therapy along with Herceptin with the Herceptin then being converted to every 3 weeks to finish a total of one year. Because patient's tumor is ER positive she will also be given antiestrogen therapy adjuvantly.  CURRENT THERAPY:Cycle 3 of TCH  INTERVAL HISTORY: 67 year old female here for followup. She is receiving chemotherapy consisting of Taxotere carboplatinum Herceptin after having undergone a mastectomy. Her final pathology showed a stage II HER-2 positive breast cancer. Thus far she has tolerated her chemotherapy well. She is here for cycle 3. She denies  any fevers chills night sweats headaches she has no shortness of breath no chest pains or palpitations. She is slightly anemic do to her chemotherapy otherwise no complaints.   MEDICAL HISTORY: Past Medical History  Diagnosis Date  . Chronic kidney disease   . Hx: UTI (urinary tract infection)   . Right shoulder pain   . Right knee pain   . Hypertension     recently started Aldactone   . Hyperlipidemia     but not on meds;diet and exercise controlled  . Dizziness     has been going on for 72months;medical MD aware  . H/O hiatal hernia   . GERD (gastroesophageal reflux disease)     Tums prn  . Hemorrhoids   . Urinary frequency     d/t taking Aldactone  . History of UTI   . Anemia     hx of  . Cataract     immature-not sure of which eye  . Anxiety     r/t updated surgery  . Insomnia     takes Melatoniin daily  . Complication of anesthesia     pt states she is sensitive to meds  . Breast cancer     right  . Skin cancer     ALLERGIES:  is allergic to codeine; keflex; naprosyn; and tussin.  MEDICATIONS:  Current Outpatient Prescriptions  Medication Sig Dispense Refill  . B Complex-C (SUPER B COMPLEX PO) Take 1 each by mouth daily.      . calcium carbonate (TUMS - DOSED IN MG ELEMENTAL CALCIUM) 500 MG chewable tablet Chew 1 tablet by mouth daily.      . Cholecalciferol (VITAMIN D-3 PO) Take 4,000 Units by  mouth daily.      . clindamycin (CLINDAGEL) 1 % gel Apply topically 2 (two) times daily.  30 g  0  . dexamethasone (DECADRON) 4 MG tablet Take 2 tablets (8 mg total) by mouth 2 (two) times daily with a meal. Take two times a day the day before Taxotere. Then take two times a day starting the day after chemo for 3 days.  36 tablet  5  . lidocaine-prilocaine (EMLA) cream Apply topically as needed.  30 g  7  . magnesium gluconate (MAGONATE) 500 MG tablet Take 500 mg by mouth 2 (two) times daily.      Marland Kitchen omeprazole (PRILOSEC) 20 MG capsule Take 1 capsule (20 mg total) by mouth  daily.  90 capsule  3  . PREVIDENT 5000 BOOSTER PLUS 1.1 % PSTE Take 1 application by mouth Twice daily.      . Probiotic Product (PROBIOTIC DAILY PO) Take 1 each by mouth.      . spironolactone (ALDACTONE) 25 MG tablet Take 0.5 tablets (12.5 mg total) by mouth daily.  30 tablet  3  . ciprofloxacin (CIPRO) 500 MG tablet       . Loratadine (CLARITIN PO) Take 1 each by mouth daily.      Marland Kitchen LORazepam (ATIVAN) 0.5 MG tablet Take 1 tablet (0.5 mg total) by mouth every 6 (six) hours as needed (Nausea or vomiting).  30 tablet  0  . ondansetron (ZOFRAN) 8 MG tablet Take 1 tablet (8 mg total) by mouth 2 (two) times daily. Take two times a day starting the day after chemo for 3 days. Then take two times a day as needed for nausea or vomiting.  30 tablet  1  . prochlorperazine (COMPAZINE) 10 MG tablet Take 1 tablet (10 mg total) by mouth every 6 (six) hours as needed (Nausea or vomiting).  30 tablet  1  . prochlorperazine (COMPAZINE) 25 MG suppository Place 1 suppository (25 mg total) rectally every 12 (twelve) hours as needed for nausea.  12 suppository  3   No current facility-administered medications for this visit.    SURGICAL HISTORY:  Past Surgical History  Procedure Laterality Date  . Cyst removed from left breast  1970  . Left wrist surgery   2004    with plate  . Colonoscopy    . Esophagogastroduodenoscopy    . Mastectomy, radical  05/11/2011    right   . Mastectomy modified radical  05/10/2012    Procedure: MASTECTOMY MODIFIED RADICAL;  Surgeon: Robyne Askew, MD;  Location: [facility] OR;  Service: General;  Laterality: Right;  RIGHT MODIFIED RADICAL MASTECTOMY  . Portacath placement  05/10/2012    Procedure: INSERTION PORT-A-CATH;  Surgeon: Robyne Askew, MD;  Location: [facility] OR;  Service: General;  Laterality: Left;  . Wrist fracture surgery  2004    REVIEW OF SYSTEMS:  General: fatigue (-), night sweats (-), fever (-), pain (-) Lymph: palpable nodes (-) HEENT: vision changes (-), mucositis  (-), gum bleeding (-), epistaxis (-) Cardiovascular: chest pain (-), palpitations (-) Pulmonary: shortness of breath (-), dyspnea on exertion (-), cough (-), hemoptysis (-) GI:  Early satiety (-), melena (-), dysphagia (-), nausea/vomiting (-), diarrhea (-) GU: dysuria (-), hematuria (-), incontinence (-) Musculoskeletal: joint swelling (-), joint pain (-), back pain (-) Neuro: weakness (-), numbness (-), headache (-), confusion (-) Skin: Rash (-), lesions (-), dryness (-) Psych: depression (-), suicidal/homicidal ideation (-), feeling of hopelessness (-)    PHYSICAL EXAMINATION: Blood  pressure 128/92, pulse 82, temperature 98.3 F (36.8 C), temperature source Oral, resp. rate 20, height 5\' 4"  (1.626 m), weight 142 lb 11.2 oz (64.728 kg). Body mass index is 24.48 kg/(m^2). General: Patient is a well appearing female in no acute distress HEENT: PERRLA, sclerae anicteric no conjunctival pallor, MMM Neck: supple, no palpable adenopathy Lungs: clear to auscultation bilaterally, no wheezes, rhonchi, or rales Cardiovascular: regular rate rhythm, S1, S2, no murmurs, rubs or gallops Abdomen: Soft, non-tender, non-distended, normoactive bowel sounds, no HSM Extremities: warm and well perfused, no clubbing, cyanosis, or edema Skin: No rashes or lesions, pustular rash on scalp Neuro: Non-focal Breasts: The right breast is status post mastectomy. There is no evidence of local recurrence. The right axilla is benign. The left breast is unremarkable. ECOG PERFORMANCE STATUS: 1   LABORATORY DATA: Lab Results  Component Value Date   WBC 8.4 08/09/2012   HGB 10.4* 08/09/2012   HCT 30.5* 08/09/2012   MCV 88.4 08/09/2012   PLT 64* 08/09/2012      Chemistry      Component Value Date/Time   NA 133* 08/09/2012 1324   NA 138 05/11/2012 0640   K 3.7 08/09/2012 1324   K 4.7 05/11/2012 0640   CL 100 08/09/2012 1324   CL 99 05/11/2012 0640   CO2 24 08/09/2012 1324   CO2 20 05/11/2012 0640   BUN 11.2 08/09/2012 1324   BUN  12 05/11/2012 0640   CREATININE 0.9 08/09/2012 1324   CREATININE 0.88 05/11/2012 0640      Component Value Date/Time   CALCIUM 8.8 08/09/2012 1324   CALCIUM 9.7 05/11/2012 0640   ALKPHOS 91 08/09/2012 1324   AST 23 08/09/2012 1324   ALT 20 08/09/2012 1324   BILITOT 0.32 08/09/2012 1324     ADDITIONAL INFORMATION: CHROMOGENIC IN-SITU HYBRIDIZATION (1C - TUMOR WITH CLIP) Interpretation HER-2/NEU BY CISH - NO AMPLIFICATION OF HER-2 DETECTED. THE RATIO OF HER-2: CEP 17 SIGNALS WAS 1.25. Reference range: Ratio: HER2:CEP17 < 1.8 - gene amplification not observed Ratio: HER2:CEP 17 1.8-2.2 - equivocal result Ratio: HER2:CEP17 > 2.2 - gene amplification observed CHROMOGENIC IN-SITU HYBRIDIZATION (1E - TUMOR WITHOUT CLIP) Interpretation: HER2/NEU BY CISH - SHOWS AMPLIFICATION BY CISH ANALYSIS. THE RATIO OF HER2: CEP 17 SIGNALS WAS 2.83. Reference range: Ratio: HER2:CEP17 < 1.8 gene amplification not observed Ratio: HER2:CEP 17 1.8-2.2 - equivocal result Ratio: HER2:CEP17 > 2.2 - gene amplification observed Pecola Leisure MD Pathologist, Electronic Signature ( Signed 05/15/2012) FINAL DIAGNOSIS Diagnosis Breast, modified radical mastectomy , right, with axillary contents 1 of 4 FINAL for FARRYN, LINARES S (SZA14-4) Diagnosis(continued) TUMOR #1 (WITH CLIP): - INVASIVE LOBULAR CARCINOMA, GRADE II, SEE COMMENT. - LYMPHOVASCULAR INVASION IDENTIFIED. - PREVIOUS BIOPSY SITE IDENTIFIED. TUMOR #2 (NO CLIP): - INVASIVE LOBULAR CARCINOMA, GRADE II, SEE COMMENT. - LOBULAR CARCINOMA IN SITU. - LYMPHOVASCULAR INVASION IDENTIFIED. - THREE LYMPH NODES, POSITIVE FOR METASTATIC MAMMARY CARCINOMA (3/7). - TUMOR DEPOSITS ARE 1.7, 1.6, AND 5 CM. - EXTRACAPSULAR TUMOR EXTENSION IDENTIFIED. - TWO LYMPH NODES, POSITIVE FOR ISOLATED TUMOR CELLS. Microscopic Comment BREAST, INVASIVE TUMOR, WITH LYMPH NODE SAMPLING Specimen, including laterality: Right breast Procedure: Modified radical mastectomy Grade: Tumor 1 and  tumor 2: Grade II of III Tubule formation: 3 Nuclear pleomorphism: 2 Mitotic: 1 Tumor size (gross measurement: Tumor #1- 2.3 cm; tumor #2 - 2.4 cm Margins: Invasive, distance to closest margin: Tumor #1 2.7 cm - deep margin; tumor #2 - 1.6 cm - deep margin Lymphovascular invasion: Present Ductal carcinoma in situ: Absent Grade:  N/A Extensive intraductal component: N/A Lobular neoplasia: Both atypical hyperplasia and in situ carcinoma Extent of tumor: Skin: Grossly negative Nipple: Grossly negative Skeletal muscle: Microscopically negative Lymph nodes: # examined: 7 Lymph nodes with metastasis: 3 Isolated tumor cells (< 0.2 mm): two lymph nodes (Slide 1P and 1O) Micrometastasis: (> 0.2 mm and < 2.0 mm): none Macrometastasis: (> 2.0 mm): 1.7 cm , 1.6 cm and 5 cm Extracapsular extension: Present Breast prognostic profile: Tumor #1: Estrogen receptor: Not repeated, previous study demonstrated 98% positivity ([id number]) Progesterone receptor: Not repeated, previous study demonstrated 0% positivity ([id number]) Her 2 neu: Repeated, previous study demonstrated no amplification (1.38) ([id number]) Ki-67: Not repeated, previous study demonstrated 5% proliferation rate ([id number]) Tumor #2: Estrogen receptor: Not repeated, previous study demonstrated 97% positivity ([id number]) Progesterone receptor: Not repeated, previous study demonstrated 0% positivity ([id number]) Her 2 neu: Repeated, previous study demonstrated amplification (3.65) ([id number]) Ki-67: Not repeated, previous study demonstrated 44% proliferation rate ([id number]) 2 of 4 FINAL for S[place]RI[place], MCENROE (SZA14-4) Microscopic Comment(continued) Non-neoplastic breast: Benign fibrocystic change with usual ductal hyperplasia, sclerosing adenosis, benign radial sclerosing lesion, and microcalcifications in benign ducts and lobules. TNM: mpT2, pN1a, pMX. Comments: none  RADIOGRAPHIC STUDIES:  Mr Lodema Pilot  Contrast  03/23/2012  *RADIOLOGY REPORT*  Clinical Data: New diagnosis breast cancer.  Rule out metastatic disease.  MRI HEAD WITHOUT AND WITH CONTRAST  Technique:  Multiplanar, multiecho pulse sequences of the brain and surrounding structures were obtained according to standard protocol without and with intravenous contrast  Contrast: 12mL MULTIHANCE GADOBENATE DIMEGLUMINE 529 MG/ML IV SOLN  Comparison: None.  Findings: Ventricle size is normal.  Small 4 mm hyperintensities in the cerebral white matter bilaterally do not enhance and are most likely due to chronic microvascular ischemia.  Negative for acute infarct.  Diffusion weighted imaging is normal.  Pituitary is normal in size.  Negative for intracranial hemorrhage.  Following contrast infusion, no enhancing lesions are identified.  Paranasal sinuses are clear.  Vessels at the base of the brain are patent.  IMPRESSION: Negative for metastatic disease.  No acute abnormality.   Original Report Authenticated By: Janeece Riggers, M.D.    Mr Breast Bilateral W Wo Contrast  03/12/2012  *RADIOLOGY REPORT*  Clinical Data: Recently diagnosed invasive mammary carcinoma in the 10 o'clock region of the right breast with a metastatic right axillary lymph node.  BILATERAL BREAST MRI WITH AND WITHOUT CONTRAST  Technique: Multiplanar, multisequence MR images of both breasts were obtained prior to and following the intravenous administration of 12ml of multihance.  Three dimensional images were evaluated at the independent DynaCad workstation.  Comparison:  Mammograms dated 03/05/2012, 03/01/2012, 03/01/2011 and 02/26/2010 from [facility].  Findings: There is a moderate background parenchymal enhancement pattern.  Right breast: 1.  There is a lobulated 2.2 x 1.7 x 1.8 cm mass in the posterior third of the upper outer quadrant of the right breast.  There is a post biopsy clip artifact 1 cm lateral to the mass. 2.  In the anterior third upper outer quadrant of the  right breast there is patchy non mass like enhancement measuring 0.8 x 0.3 x 0.3 cm.  Left breast:  There is no abnormal enhancement the left breast.  There are enlarged right axillary lymph nodes.  The largest right axillary lymph node measures 2.4 cm and is located lateral to the pectoralis minor consistent with level I adenopathy.  There is a 1.6 cm lymph node behind the pectoralis minor consistent with enlarged level II  adenopathy.  IMPRESSION:  1.  2.2 cm mass in the posterior third of the upper outer quadrant of the right breast corresponding with the known invasive mammary carcinoma. 2.  Patchy non mass-like enhancement in the anterior third of the upper outer quadrant of the right breast.  MR guided core biopsy is recommended. 3. Enlarged level I and II right axillary adenopathy.  RECOMMENDATION: MR guided core biopsy of the right breast.  THREE-DIMENSIONAL MR IMAGE RENDERING ON INDEPENDENT WORKSTATION:  Three-dimensional MR images were rendered by post-processing of the original MR data on an independent workstation.  The three- dimensional MR images were interpreted, and findings were reported in the accompanying complete MRI report for this study.  BI-RADS CATEGORY 4:  Suspicious abnormality - biopsy should be considered.   Original Report Authenticated By: Baird Lyons, M.D.    Mr Biopsy/wire Localization  03/20/2012  *RADIOLOGY REPORT*  Clinical Data:  Known right breast carcinoma.  Additional area of worrisome enhancement located within the anterior upper outer quadrant of the right breast.  MRI GUIDED VACUUM ASSISTED BIOPSY OF THE RIGHT BREAST WITHOUT AND WITH CONTRAST  Comparison: Previous exams.  Technique: Multiplanar, multisequence MR images of the right breast were obtained prior to and following the intravenous administration of 12 ml of Mulithance.  I met with the patient, and we discussed the procedure of MRI guided biopsy, including risks, benefits, and alternatives. Specifically, we  discussed the risks of infection, bleeding, tissue injury, clip migration, and inadequate sampling.  Informed, written consent was given.  Using sterile technique, 2% Lidocaine, MRI guidance, and a 9 gauge vacuum assisted device, biopsy was performed of the area of enhancement located within the anterior portion of the upper-outer quadrant - right breast using a lateral approach.  At the conclusion of the procedure, a  tissue marker clip was deployed into the biopsy cavity.  IMPRESSION: MRI guided biopsy of the area of enhancement located within the anterior portion of the upper-outer quadrant of the right breast as discussed above. No apparent complications.  BI-RADS CATEGORY 6:  Known biopsy-proven malignancy - appropriate action should be taken.  THREE-DIMENSIONAL MR IMAGE RENDERING ON INDEPENDENT WORKSTATION:  Three-dimensional MR images were rendered by post-processing of the original MR data on an independent workstation.  The three- dimensional MR images were interpreted, and findings were reported in the accompanying complete MRI report for this study.  The pathology associated with the right breast MR guided core biopsy demonstrated invasive mammary carcinoma and in situ mammary carcinoma.  This is concordant with imaging findings.  I have discussed findings with the patient by telephone and answered her questions.  The patient states that biopsy site is clean and dry without hematoma formation or signs of infection.  Post biopsy wound care instructions were reviewed with the patient.  Follow-up will be with Dr. Carolynne Edouard and Dr. Welton Flakes regards her treatment plan.  The patient was encouraged to call the Breast Center for additional questions or concerns   Original Report Authenticated By: Rolla Plate, M.D.    Nm Pet Image Initial (pi) Skull Base To Thigh  03/22/2012  *RADIOLOGY REPORT*  Clinical Data: Initial treatment strategy for high risk right breast cancer.  NUCLEAR MEDICINE PET SKULL BASE TO THIGH   Fasting Blood Glucose:  100  Technique:  18.6 mCi F-18 FDG was injected intravenously. CT data was obtained and used for attenuation correction and anatomic localization only.  (This was not acquired as a diagnostic CT examination.) Additional exam technical data entered on technologist  worksheet.  Comparison:  MRI breast dated 03/12/2012  Findings:  Neck: No hypermetabolic lymph nodes in the neck.  Chest:  Vague hypermetabolism in the upper/central right breast, max SUV 2.7.  Additional focal hypermetabolism in the upper/outer right breast, max SUV 2.3 (PET image 105).  These findings correspond to biopsy-proven primary breast carcinoma.  1.3 cm short axis right subpectoral lymph node (series 2/image 66), max SUV 3.1.  Right axillary lymphadenopathy measuring up to 1.6 cm short axis (series 2/image 88), max SUV 4.8).  No hypermetabolic mediastinal or hilar nodes.  No suspicious pulmonary nodules on the CT scan.  Abdomen/Pelvis:  No abnormal hypermetabolic activity within the liver, pancreas, adrenal glands, or spleen.  No hypermetabolic lymph nodes in the abdomen or pelvis.  Skeleton:  No focal hypermetabolic activity to suggest skeletal metastasis.  IMPRESSION: Two foci of hypermetabolism in the upper right breast, max SUV 2.7, corresponding to biopsy-proven primary breast carcinoma.  Associated right subpectoral and axillary nodal metastases, max SUV 4.8.  No evidence of distant metastases.   Original Report Authenticated By: Charline Bills, M.D.    Mm Digital Diagnostic Unilat R  03/20/2012  *RADIOLOGY REPORT*  Clinical Data:  Post right breast MR guided core biopsy.  DIGITAL DIAGNOSTIC RIGHT BREAST MAMMOGRAM  Comparison:  03/05/2012, 03/01/2012, 03/01/2011, 02/26/2010 mammograms from [facility] imaging.  Findings:  Films are performed following MR guided biopsy of the anterior portion of the upper-outer quadrant of the right breast. The hourglass shaped clip placed during the MR guided core biopsy appears in  appropriate position.  IMPRESSION: Appropriate positioning of clip following right breast MR guided core biopsy.   Original Report Authenticated By: Rolla Plate, M.D.     ASSESSMENT: 67 year old [name], [place] woman  #1  status post mastectomy with right axillary lymph node dissection 05/10/2012 for an mpT2 N1, stage stage IIB invasive lobular breast cancer, stage IIB, ER positive, HER-2/neu positive, with an Mib-1 between 5% and 44%  #2 receiving adjuvant chemotherapy consisting of Taxotere, carboplatinum and Herceptin given every 21 days for total of 6 cycles of Taxotere carboplatinum and weekly Herceptin beginning on 06/14/12.  #3 Herceptin to be continued to total one year; most recent echo 03/28/2012  PLAN:   1 proceed with TCH cycle 3  2. Return in 1 week for herceptin only  All questions were answered. The patient knows to call the clinic with any problems, questions or concerns. We can certainly see the patient much sooner if necessary.   I spent 25 minutes counseling the patient face to face. The total time spent in the appointment was 30 minutes.  Drue Second, MD Medical/Oncology [facility] [phone] (beeper) [phone] (Office)

## 2012-08-15 ENCOUNTER — Telehealth: Payer: Self-pay | Admitting: Oncology

## 2012-08-15 NOTE — Telephone Encounter (Signed)
Due to [place] PAL 6/5 appts moved to 6/4. tx remains 6/5. S/w pt she is aware and will get new schedule tomorrow.

## 2012-08-16 ENCOUNTER — Ambulatory Visit (HOSPITAL_BASED_OUTPATIENT_CLINIC_OR_DEPARTMENT_OTHER): Payer: Medicare Other

## 2012-08-16 ENCOUNTER — Other Ambulatory Visit (HOSPITAL_BASED_OUTPATIENT_CLINIC_OR_DEPARTMENT_OTHER): Payer: Medicare Other | Admitting: Lab

## 2012-08-16 ENCOUNTER — Ambulatory Visit (HOSPITAL_BASED_OUTPATIENT_CLINIC_OR_DEPARTMENT_OTHER): Payer: Medicare Other | Admitting: Physician Assistant

## 2012-08-16 VITALS — BP 138/67 | HR 97 | Temp 97.1°F | Resp 20 | Ht 64.0 in | Wt 143.6 lb

## 2012-08-16 DIAGNOSIS — Z5111 Encounter for antineoplastic chemotherapy: Secondary | ICD-10-CM

## 2012-08-16 DIAGNOSIS — Z170 Estrogen receptor positive status [ER+]: Secondary | ICD-10-CM

## 2012-08-16 DIAGNOSIS — C50419 Malignant neoplasm of upper-outer quadrant of unspecified female breast: Secondary | ICD-10-CM

## 2012-08-16 DIAGNOSIS — Z5112 Encounter for antineoplastic immunotherapy: Secondary | ICD-10-CM

## 2012-08-16 DIAGNOSIS — C50411 Malignant neoplasm of upper-outer quadrant of right female breast: Secondary | ICD-10-CM

## 2012-08-16 LAB — CBC WITH DIFFERENTIAL/PLATELET
BASO%: 0.1 % (ref 0.0–2.0)
Basophils Absolute: 0 10*3/uL (ref 0.0–0.1)
EOS%: 0 % (ref 0.0–7.0)
Eosinophils Absolute: 0 10*3/uL (ref 0.0–0.5)
HCT: 31 % — ABNORMAL LOW (ref 34.8–46.6)
HGB: 10.4 g/dL — ABNORMAL LOW (ref 11.6–15.9)
LYMPH%: 7.2 % — ABNORMAL LOW (ref 14.0–49.7)
MCH: 30.4 pg (ref 25.1–34.0)
MCHC: 33.5 g/dL (ref 31.5–36.0)
MCV: 90.6 fL (ref 79.5–101.0)
MONO#: 0.6 10*3/uL (ref 0.1–0.9)
MONO%: 4.9 % (ref 0.0–14.0)
NEUT#: 10.6 10*3/uL — ABNORMAL HIGH (ref 1.5–6.5)
NEUT%: 87.8 % — ABNORMAL HIGH (ref 38.4–76.8)
Platelets: 212 10*3/uL (ref 145–400)
RBC: 3.42 10*6/uL — ABNORMAL LOW (ref 3.70–5.45)
RDW: 20.2 % — ABNORMAL HIGH (ref 11.2–14.5)
WBC: 12 10*3/uL — ABNORMAL HIGH (ref 3.9–10.3)
lymph#: 0.9 10*3/uL (ref 0.9–3.3)

## 2012-08-16 LAB — COMPREHENSIVE METABOLIC PANEL (CC13)
ALT: 28 U/L (ref 0–55)
AST: 24 U/L (ref 5–34)
Albumin: 3.8 g/dL (ref 3.5–5.0)
Alkaline Phosphatase: 85 U/L (ref 40–150)
BUN: 20.6 mg/dL (ref 7.0–26.0)
CO2: 21 mEq/L — ABNORMAL LOW (ref 22–29)
Calcium: 9.5 mg/dL (ref 8.4–10.4)
Chloride: 98 mEq/L (ref 98–107)
Creatinine: 1 mg/dL (ref 0.6–1.1)
Glucose: 226 mg/dl — ABNORMAL HIGH (ref 70–99)
Potassium: 3.8 mEq/L (ref 3.5–5.1)
Sodium: 131 mEq/L — ABNORMAL LOW (ref 136–145)
Total Bilirubin: 0.41 mg/dL (ref 0.20–1.20)
Total Protein: 7 g/dL (ref 6.4–8.3)

## 2012-08-16 MED ORDER — SODIUM CHLORIDE 0.9 % IJ SOLN
10.0000 mL | INTRAMUSCULAR | Status: DC | PRN
  Administered 2012-08-16: 10 mL
  Filled 2012-08-16: qty 10

## 2012-08-16 MED ORDER — SODIUM CHLORIDE 0.9 % IV SOLN
75.0000 mg/m2 | Freq: Once | INTRAVENOUS | Status: AC
  Administered 2012-08-16: 120 mg via INTRAVENOUS
  Filled 2012-08-16: qty 12

## 2012-08-16 MED ORDER — ACETAMINOPHEN 325 MG PO TABS
650.0000 mg | ORAL_TABLET | Freq: Once | ORAL | Status: AC
  Administered 2012-08-16: 650 mg via ORAL

## 2012-08-16 MED ORDER — HEPARIN SOD (PORK) LOCK FLUSH 100 UNIT/ML IV SOLN
500.0000 [IU] | Freq: Once | INTRAVENOUS | Status: AC | PRN
  Administered 2012-08-16: 500 [IU]
  Filled 2012-08-16: qty 5

## 2012-08-16 MED ORDER — DIPHENHYDRAMINE HCL 25 MG PO CAPS
50.0000 mg | ORAL_CAPSULE | Freq: Once | ORAL | Status: AC
  Administered 2012-08-16: 50 mg via ORAL

## 2012-08-16 MED ORDER — TRASTUZUMAB CHEMO INJECTION 440 MG
2.0000 mg/kg | Freq: Once | INTRAVENOUS | Status: AC
  Administered 2012-08-16: 126 mg via INTRAVENOUS
  Filled 2012-08-16: qty 6

## 2012-08-16 MED ORDER — SODIUM CHLORIDE 0.9 % IV SOLN
520.0000 mg | Freq: Once | INTRAVENOUS | Status: AC
  Administered 2012-08-16: 520 mg via INTRAVENOUS
  Filled 2012-08-16: qty 52

## 2012-08-16 MED ORDER — DEXAMETHASONE SODIUM PHOSPHATE 4 MG/ML IJ SOLN
20.0000 mg | Freq: Once | INTRAMUSCULAR | Status: AC
  Administered 2012-08-16: 20 mg via INTRAVENOUS

## 2012-08-16 MED ORDER — SODIUM CHLORIDE 0.9 % IV SOLN
Freq: Once | INTRAVENOUS | Status: AC
  Administered 2012-08-16: 14:00:00 via INTRAVENOUS

## 2012-08-16 MED ORDER — ONDANSETRON 16 MG/50ML IVPB (CHCC)
16.0000 mg | Freq: Once | INTRAVENOUS | Status: AC
  Administered 2012-08-16: 16 mg via INTRAVENOUS

## 2012-08-16 NOTE — Patient Instructions (Signed)
Follow up as per your prior printed schedule.

## 2012-08-16 NOTE — Progress Notes (Signed)
OFFICE PROGRESS NOTE   Heather Bradford, MD [address] [place] [address] Dr. Chevis Pretty Dr. Chipper Herb  DIAGNOSIS: 67 year old female with invasive lobular breast cancer, stage IIB, receiving adjuvant chemotherapy  PRIOR THERAPY:  #1 patient was originally seen in the multidisciplinary breast clinic on 03/14/2012 for new diagnosis of invasive ductal carcinoma that was HER-2 positive node-positive, ER positive PR negative with Ki-67 of 44%  #2 on MRI patient also had an anterior breast mass that was to be biopsied to decide whether or not she should receive neoadjuvant chemotherapy or adjuvant chemotherapy. Patient'[name] mass was biopsied and it was positive for a malignancy that was HER-2/neu negative.  #3 patient is now status post right mastectomy with axillary lymph node dissection with the final pathology revealing the largest tumor measuring 5 cm, 1.7 cm and 1.6 cm. Tumor was ER positive PR negative HER-2/neu positive with a Ki-67 of 44% 3 of 7 lymph nodes were positive for metastatic disease.  #4 patient will proceed with adjuvant chemotherapy and Herceptin. Chemotherapy will consist of Taxotere carboplatinum given every 21 days with weekly Herceptin. Total of 6 cycles of Taxotere carboplatin will be administered starting on 06/14/2012. Once patient completes her chemotherapy then she will go on to receive radiation therapy along with Herceptin with the Herceptin then being converted to every 3 weeks to finish a total of one year. Because patient'[name] tumor is ER positive she will also be given antiestrogen therapy adjuvantly.  CURRENT THERAPY:Cycle 4 day 1 of TCH  INTERVAL HISTORY: Heather [name] is doing well today. She is here for follow up prior to chemotherapy.  Her  mild redness on her face has resolved with the use of moisturizer. No new complaints are verbalized by patient. Denies blurred vision. Rest of the  10 point ROS is negative.   MEDICAL HISTORY: Past Medical History   Diagnosis Date  . Chronic kidney disease   . Hx: UTI (urinary tract infection)   . Right shoulder pain   . Right knee pain   . Hypertension     recently started Aldactone   . Hyperlipidemia     but not on meds;diet and exercise controlled  . Dizziness     has been going on for 16months;medical MD aware  . H/O hiatal hernia   . GERD (gastroesophageal reflux disease)     Tums prn  . Hemorrhoids   . Urinary frequency     d/t taking Aldactone  . History of UTI   . Anemia     hx of  . Cataract     immature-not sure of which eye  . Anxiety     r/t updated surgery  . Insomnia     takes Melatoniin daily  . Complication of anesthesia     pt states she is sensitive to meds  . Breast cancer     right  . Skin cancer     ALLERGIES:  is allergic to codeine; keflex; naprosyn; and tussin.  MEDICATIONS:  Current Outpatient Prescriptions  Medication Sig Dispense Refill  . B Complex-C (SUPER B COMPLEX PO) Take 1 each by mouth daily.      . calcium carbonate (TUMS - DOSED IN MG ELEMENTAL CALCIUM) 500 MG chewable tablet Chew 1 tablet by mouth daily.      . Cholecalciferol (VITAMIN D-3 PO) Take 4,000 Units by mouth daily.      . ciprofloxacin (CIPRO) 500 MG tablet       . clindamycin (CLINDAGEL) 1 %  gel Apply topically 2 (two) times daily.  30 g  0  . dexamethasone (DECADRON) 4 MG tablet Take 2 tablets (8 mg total) by mouth 2 (two) times daily with a meal. Take two times a day the day before Taxotere. Then take two times a day starting the day after chemo for 3 days.  36 tablet  5  . lidocaine-prilocaine (EMLA) cream Apply topically as needed.  30 g  7  . Loratadine (CLARITIN PO) Take 1 each by mouth daily.      Marland Kitchen LORazepam (ATIVAN) 0.5 MG tablet Take 1 tablet (0.5 mg total) by mouth every 6 (six) hours as needed (Nausea or vomiting).  30 tablet  0  . magnesium gluconate (MAGONATE) 500 MG tablet Take 500 mg by mouth 2 (two) times daily.      Marland Kitchen omeprazole (PRILOSEC) 20 MG capsule Take 1  capsule (20 mg total) by mouth daily.  90 capsule  3  . ondansetron (ZOFRAN) 8 MG tablet Take 1 tablet (8 mg total) by mouth 2 (two) times daily. Take two times a day starting the day after chemo for 3 days. Then take two times a day as needed for nausea or vomiting.  30 tablet  1  . PREVIDENT 5000 BOOSTER PLUS 1.1 % PSTE Take 1 application by mouth Twice daily.      . Probiotic Product (PROBIOTIC DAILY PO) Take 1 each by mouth.      . prochlorperazine (COMPAZINE) 10 MG tablet Take 1 tablet (10 mg total) by mouth every 6 (six) hours as needed (Nausea or vomiting).  30 tablet  1  . prochlorperazine (COMPAZINE) 25 MG suppository Place 1 suppository (25 mg total) rectally every 12 (twelve) hours as needed for nausea.  12 suppository  3  . spironolactone (ALDACTONE) 25 MG tablet Take 0.5 tablets (12.5 mg total) by mouth daily.  30 tablet  3   No current facility-administered medications for this visit.    SURGICAL HISTORY:  Past Surgical History  Procedure Laterality Date  . Cyst removed from left breast  1970  . Left wrist surgery   2004    with plate  . Colonoscopy    . Esophagogastroduodenoscopy    . Mastectomy, radical  05/11/2011    right   . Mastectomy modified radical  05/10/2012    Procedure: MASTECTOMY MODIFIED RADICAL;  Surgeon: Robyne Askew, MD;  Location: [facility] OR;  Service: General;  Laterality: Right;  RIGHT MODIFIED RADICAL MASTECTOMY  . Portacath placement  05/10/2012    Procedure: INSERTION PORT-A-CATH;  Surgeon: Robyne Askew, MD;  Location: [facility] OR;  Service: General;  Laterality: Left;  . Wrist fracture surgery  2004    REVIEW OF SYSTEMS:  General: fatigue (+), night sweats (-), fever (-), pain (-) Lymph: palpable nodes (-) HEENT: vision changes (-), mucositis (-), gum bleeding (-), epistaxis (-) Cardiovascular: chest pain (-), palpitations (-) Pulmonary: shortness of breath (-), dyspnea on exertion (-), cough (-), hemoptysis (-) GI:  Early satiety (-), melena (-),  dysphagia (-), nausea/vomiting (-), diarrhea (-) GU: dysuria (-), hematuria (-), incontinence (-) Musculoskeletal: joint swelling (-), joint pain (+), back pain (-) Neuro: weakness (-), numbness (-), headache (-), confusion (-) Skin: Rash (-), lesions (-), dryness (-) Psych: depression (-), suicidal/homicidal ideation (-), feeling of hopelessness (-)    PHYSICAL EXAMINATION: Blood pressure 138/67, pulse 97, temperature 97.1 F (36.2 C), temperature source Oral, resp. rate 20, height 5\' 4"  (1.626 m), weight 143 lb 9.6  oz (65.137 kg). Body mass index is 24.64 kg/(m^2).  General: Patient is a well appearing female in no acute distress HEENT: PERRLA, sclerae anicteric no conjunctival pallor, MMM Neck: supple, no palpable adenopathy Lungs: clear to auscultation bilaterally, no wheezes, rhonchi, or rales Cardiovascular: regular rate rhythm, S1, S2, no murmurs, rubs or gallops Abdomen: Soft, non-tender, non-distended, normoactive bowel sounds, no HSM Extremities: warm and well perfused, no clubbing, cyanosis, or edema Skin: No rashes or lesions, pustular rash on scalp Neuro: Non-focal Breasts: The right breast is status post mastectomy. There is no evidence of local recurrence. The right axilla is benign. The left breast is unremarkable.  ECOG PERFORMANCE STATUS: 1   LABORATORY DATA: Lab Results  Component Value Date   WBC 12.0* 08/16/2012   HGB 10.4* 08/16/2012   HCT 31.0* 08/16/2012   MCV 90.6 08/16/2012   PLT 212 08/16/2012      Chemistry      Component Value Date/Time   NA 133* 08/09/2012 1324   NA 138 05/11/2012 0640   K 3.7 08/09/2012 1324   K 4.7 05/11/2012 0640   CL 100 08/09/2012 1324   CL 99 05/11/2012 0640   CO2 24 08/09/2012 1324   CO2 20 05/11/2012 0640   BUN 11.2 08/09/2012 1324   BUN 12 05/11/2012 0640   CREATININE 0.9 08/09/2012 1324   CREATININE 0.88 05/11/2012 0640      Component Value Date/Time   CALCIUM 8.8 08/09/2012 1324   CALCIUM 9.7 05/11/2012 0640   ALKPHOS 91 08/09/2012 1324    AST 23 08/09/2012 1324   ALT 20 08/09/2012 1324   BILITOT 0.32 08/09/2012 1324     ADDITIONAL INFORMATION: CHROMOGENIC IN-SITU HYBRIDIZATION (1C - TUMOR WITH CLIP) Interpretation HER-2/NEU BY CISH - NO AMPLIFICATION OF HER-2 DETECTED. THE RATIO OF HER-2: CEP 17 SIGNALS WAS 1.25. Reference range: Ratio: HER2:CEP17 < 1.8 - gene amplification not observed Ratio: HER2:CEP 17 1.8-2.2 - equivocal result Ratio: HER2:CEP17 > 2.2 - gene amplification observed CHROMOGENIC IN-SITU HYBRIDIZATION (1E - TUMOR WITHOUT CLIP) Interpretation: HER2/NEU BY CISH - SHOWS AMPLIFICATION BY CISH ANALYSIS. THE RATIO OF HER2: CEP 17 SIGNALS WAS 2.83. Reference range: Ratio: HER2:CEP17 < 1.8 gene amplification not observed Ratio: HER2:CEP 17 1.8-2.2 - equivocal result Ratio: HER2:CEP17 > 2.2 - gene amplification observed Pecola Leisure MD Pathologist, Electronic Signature ( Signed 05/15/2012) FINAL DIAGNOSIS Diagnosis Breast, modified radical mastectomy , right, with axillary contents 1 of 4 FINAL for Heather [name], Heather [name] ([id number]) Diagnosis(continued) TUMOR #1 (WITH CLIP): - INVASIVE LOBULAR CARCINOMA, GRADE II, SEE COMMENT. - LYMPHOVASCULAR INVASION IDENTIFIED. - PREVIOUS BIOPSY SITE IDENTIFIED. TUMOR #2 (NO CLIP): - INVASIVE LOBULAR CARCINOMA, GRADE II, SEE COMMENT. - LOBULAR CARCINOMA IN SITU. - LYMPHOVASCULAR INVASION IDENTIFIED. - THREE LYMPH NODES, POSITIVE FOR METASTATIC MAMMARY CARCINOMA (3/7). - TUMOR DEPOSITS ARE 1.7, 1.6, AND 5 CM. - EXTRACAPSULAR TUMOR EXTENSION IDENTIFIED. - TWO LYMPH NODES, POSITIVE FOR ISOLATED TUMOR CELLS. Microscopic Comment BREAST, INVASIVE TUMOR, WITH LYMPH NODE SAMPLING Specimen, including laterality: Right breast Procedure: Modified radical mastectomy Grade: Tumor 1 and tumor 2: Grade II of III Tubule formation: 3 Nuclear pleomorphism: 2 Mitotic: 1 Tumor size (gross measurement: Tumor #1- 2.3 cm; tumor #2 - 2.4 cm Margins: Invasive, distance to closest margin: Tumor  #1 2.7 cm - deep margin; tumor #2 - 1.6 cm - deep margin Lymphovascular invasion: Present Ductal carcinoma in situ: Absent Grade: N/A Extensive intraductal component: N/A Lobular neoplasia: Both atypical hyperplasia and in situ carcinoma Extent of tumor: Skin: Grossly negative Nipple: Grossly  negative Skeletal muscle: Microscopically negative Lymph nodes: # examined: 7 Lymph nodes with metastasis: 3 Isolated tumor cells (< 0.2 mm): two lymph nodes (Slide 1P and 1O) Micrometastasis: (> 0.2 mm and < 2.0 mm): none Macrometastasis: (> 2.0 mm): 1.7 cm , 1.6 cm and 5 cm Extracapsular extension: Present Breast prognostic profile: Tumor #1: Estrogen receptor: Not repeated, previous study demonstrated 98% positivity ([id number]) Progesterone receptor: Not repeated, previous study demonstrated 0% positivity ([id number]) Her 2 neu: Repeated, previous study demonstrated no amplification (1.38) ([id number]) Ki-67: Not repeated, previous study demonstrated 5% proliferation rate ([id number]) Tumor #2: Estrogen receptor: Not repeated, previous study demonstrated 97% positivity ([id number]) Progesterone receptor: Not repeated, previous study demonstrated 0% positivity ([id number]) Her 2 neu: Repeated, previous study demonstrated amplification (3.65) ([id number]) Ki-67: Not repeated, previous study demonstrated 44% proliferation rate ([id number]) 2 of 4 FINAL for SANJNA, HASKEW ([id number]) Microscopic Comment(continued) Non-neoplastic breast: Benign fibrocystic change with usual ductal hyperplasia, sclerosing adenosis, benign radial sclerosing lesion, and microcalcifications in benign ducts and lobules. TNM: mpT2, pN1a, pMX. Comments: none  RADIOGRAPHIC STUDIES:  Mr Lodema Pilot Contrast  03/23/2012  *RADIOLOGY REPORT*  Clinical Data: New diagnosis breast cancer.  Rule out metastatic disease.  MRI HEAD WITHOUT AND WITH CONTRAST  Technique:  Multiplanar, multiecho pulse sequences of the  brain and surrounding structures were obtained according to standard protocol without and with intravenous contrast  Contrast: 12mL MULTIHANCE GADOBENATE DIMEGLUMINE 529 MG/ML IV SOLN  Comparison: None.  Findings: Ventricle size is normal.  Small 4 mm hyperintensities in the cerebral white matter bilaterally do not enhance and are most likely due to chronic microvascular ischemia.  Negative for acute infarct.  Diffusion weighted imaging is normal.  Pituitary is normal in size.  Negative for intracranial hemorrhage.  Following contrast infusion, no enhancing lesions are identified.  Paranasal sinuses are clear.  Vessels at the base of the brain are patent.  IMPRESSION: Negative for metastatic disease.  No acute abnormality.   Original Report Authenticated By: Janeece Riggers, M.D.    Mr Breast Bilateral W Wo Contrast  03/12/2012  *RADIOLOGY REPORT*  Clinical Data: Recently diagnosed invasive mammary carcinoma in the 10 o'clock region of the right breast with a metastatic right axillary lymph node.  BILATERAL BREAST MRI WITH AND WITHOUT CONTRAST  Technique: Multiplanar, multisequence MR images of both breasts were obtained prior to and following the intravenous administration of 12ml of multihance.  Three dimensional images were evaluated at the independent DynaCad workstation.  Comparison:  Mammograms dated 03/05/2012, 03/01/2012, 03/01/2011 and 02/26/2010 from [facility].  Findings: There is a moderate background parenchymal enhancement pattern.  Right breast: 1.  There is a lobulated 2.2 x 1.7 x 1.8 cm mass in the posterior third of the upper outer quadrant of the right breast.  There is a post biopsy clip artifact 1 cm lateral to the mass. 2.  In the anterior third upper outer quadrant of the right breast there is patchy non mass like enhancement measuring 0.8 x 0.3 x 0.3 cm.  Left breast:  There is no abnormal enhancement the left breast.  There are enlarged right axillary lymph nodes.  The largest right  axillary lymph node measures 2.4 cm and is located lateral to the pectoralis minor consistent with level I adenopathy.  There is a 1.6 cm lymph node behind the pectoralis minor consistent with enlarged level II adenopathy.  IMPRESSION:  1.  2.2 cm mass in the posterior third of the upper outer quadrant of the right breast  corresponding with the known invasive mammary carcinoma. 2.  Patchy non mass-like enhancement in the anterior third of the upper outer quadrant of the right breast.  MR guided core biopsy is recommended. 3. Enlarged level I and II right axillary adenopathy.  RECOMMENDATION: MR guided core biopsy of the right breast.  THREE-DIMENSIONAL MR IMAGE RENDERING ON INDEPENDENT WORKSTATION:  Three-dimensional MR images were rendered by post-processing of the original MR data on an independent workstation.  The three- dimensional MR images were interpreted, and findings were reported in the accompanying complete MRI report for this study.  BI-RADS CATEGORY 4:  Suspicious abnormality - biopsy should be considered.   Original Report Authenticated By: Baird Lyons, M.D.    Mr Biopsy/wire Localization  03/20/2012  *RADIOLOGY REPORT*  Clinical Data:  Known right breast carcinoma.  Additional area of worrisome enhancement located within the anterior upper outer quadrant of the right breast.  MRI GUIDED VACUUM ASSISTED BIOPSY OF THE RIGHT BREAST WITHOUT AND WITH CONTRAST  Comparison: Previous exams.  Technique: Multiplanar, multisequence MR images of the right breast were obtained prior to and following the intravenous administration of 12 ml of Mulithance.  I met with the patient, and we discussed the procedure of MRI guided biopsy, including risks, benefits, and alternatives. Specifically, we discussed the risks of infection, bleeding, tissue injury, clip migration, and inadequate sampling.  Informed, written consent was given.  Using sterile technique, 2% Lidocaine, MRI guidance, and a 9 gauge vacuum assisted  device, biopsy was performed of the area of enhancement located within the anterior portion of the upper-outer quadrant - right breast using a lateral approach.  At the conclusion of the procedure, a  tissue marker clip was deployed into the biopsy cavity.  IMPRESSION: MRI guided biopsy of the area of enhancement located within the anterior portion of the upper-outer quadrant of the right breast as discussed above. No apparent complications.  BI-RADS CATEGORY 6:  Known biopsy-proven malignancy - appropriate action should be taken.  THREE-DIMENSIONAL MR IMAGE RENDERING ON INDEPENDENT WORKSTATION:  Three-dimensional MR images were rendered by post-processing of the original MR data on an independent workstation.  The three- dimensional MR images were interpreted, and findings were reported in the accompanying complete MRI report for this study.  The pathology associated with the right breast MR guided core biopsy demonstrated invasive mammary carcinoma and in situ mammary carcinoma.  This is concordant with imaging findings.  I have discussed findings with the patient by telephone and answered her questions.  The patient states that biopsy site is clean and dry without hematoma formation or signs of infection.  Post biopsy wound care instructions were reviewed with the patient.  Follow-up will be with Dr. Carolynne Edouard and Dr. Welton Flakes regards her treatment plan.  The patient was encouraged to call the Breast Center for additional questions or concerns   Original Report Authenticated By: Rolla Plate, M.D.    Nm Pet Image Initial (pi) Skull Base To Thigh  03/22/2012  *RADIOLOGY REPORT*  Clinical Data: Initial treatment strategy for high risk right breast cancer.  NUCLEAR MEDICINE PET SKULL BASE TO THIGH  Fasting Blood Glucose:  100  Technique:  18.6 mCi F-18 FDG was injected intravenously. CT data was obtained and used for attenuation correction and anatomic localization only.  (This was not acquired as a diagnostic CT  examination.) Additional exam technical data entered on technologist worksheet.  Comparison:  MRI breast dated 03/12/2012  Findings:  Neck: No hypermetabolic lymph nodes in the neck.  Chest:  Vague hypermetabolism in the upper/central right breast, max SUV 2.7.  Additional focal hypermetabolism in the upper/outer right breast, max SUV 2.3 (PET image 105).  These findings correspond to biopsy-proven primary breast carcinoma.  1.3 cm short axis right subpectoral lymph node (series 2/image 66), max SUV 3.1.  Right axillary lymphadenopathy measuring up to 1.6 cm short axis (series 2/image 88), max SUV 4.8).  No hypermetabolic mediastinal or hilar nodes.  No suspicious pulmonary nodules on the CT scan.  Abdomen/Pelvis:  No abnormal hypermetabolic activity within the liver, pancreas, adrenal glands, or spleen.  No hypermetabolic lymph nodes in the abdomen or pelvis.  Skeleton:  No focal hypermetabolic activity to suggest skeletal metastasis.  IMPRESSION: Two foci of hypermetabolism in the upper right breast, max SUV 2.7, corresponding to biopsy-proven primary breast carcinoma.  Associated right subpectoral and axillary nodal metastases, max SUV 4.8.  No evidence of distant metastases.   Original Report Authenticated By: Charline Bills, M.D.    Mm Digital Diagnostic Unilat R  03/20/2012  *RADIOLOGY REPORT*  Clinical Data:  Post right breast MR guided core biopsy.  DIGITAL DIAGNOSTIC RIGHT BREAST MAMMOGRAM  Comparison:  03/05/2012, 03/01/2012, 03/01/2011, 02/26/2010 mammograms from [facility] imaging.  Findings:  Films are performed following MR guided biopsy of the anterior portion of the upper-outer quadrant of the right breast. The hourglass shaped clip placed during the MR guided core biopsy appears in appropriate position.  IMPRESSION: Appropriate positioning of clip following right breast MR guided core biopsy.   Original Report Authenticated By: Rolla Plate, M.D.     ASSESSMENT: 67 year old [place], [place]  woman  #1  status post mastectomy with right axillary lymph node dissection 05/10/2012 for an mpT2 N1, stage stage IIB invasive lobular breast cancer, stage IIB, ER positive, HER-2/neu positive, with an Mib-1 between 5% and 44%  #2 receiving adjuvant chemotherapy consisting of Taxotere, carboplatinum and Herceptin given every 21 days for total of 6 cycles of Taxotere carboplatinum and weekly Herceptin beginning on 06/14/12.  #3 Herceptin to be continued to total one year; most recent echo 03/28/2012  PLAN:   1. Heather [name] is doing well.  She will proceed with [facility] today   2. She will continue to take Super B complex daily.  She doesn't have any more numbness.    3. Her face rash is likely due to dry skin. Resolved.  All questions were answered. The patient knows to call the clinic with any problems, questions or concerns. We can certainly see the patient much sooner if necessary.   I spent 25 minutes counseling the patient face to face. The total time spent in the appointment was 30 minutes.   Marlowe Kays E 08/16/2012, 1:08 PM

## 2012-08-16 NOTE — Patient Instructions (Addendum)
[facility] Discharge Instructions for Patients Receiving Chemotherapy  Today you received the following chemotherapy agents Taxotere/Carboplatin/Herceptin.  To help prevent nausea and vomiting after your treatment, we encourage you to take your nausea medication as prescribed.   If you develop nausea and vomiting that is not controlled by your nausea medication, call the clinic. If it is after clinic hours your family physician or the after hours number for the clinic or go to the Emergency Department.   BELOW ARE SYMPTOMS THAT SHOULD BE REPORTED IMMEDIATELY:  *FEVER GREATER THAN 100.5 F  *CHILLS WITH OR WITHOUT FEVER  NAUSEA AND VOMITING THAT IS NOT CONTROLLED WITH YOUR NAUSEA MEDICATION  *UNUSUAL SHORTNESS OF BREATH  *UNUSUAL BRUISING OR BLEEDING  TENDERNESS IN MOUTH AND THROAT WITH OR WITHOUT PRESENCE OF ULCERS  *URINARY PROBLEMS  *BOWEL PROBLEMS  UNUSUAL RASH Items with * indicate a potential emergency and should be followed up as soon as possible.  Feel free to call the clinic you have any questions or concerns. The clinic phone number is [phone].   I have been informed and understand all the instructions given to me. I know to contact the clinic, my physician, or go to the Emergency Department if any problems should occur. I do not have any questions at this time, but understand that I may call the clinic during office hours   should I have any questions or need assistance in obtaining follow up care.    __________________________________________  _____________  __________ Signature of Patient or Authorized Representative            Date                   Time    __________________________________________ Nurse's Signature    

## 2012-08-17 ENCOUNTER — Ambulatory Visit (HOSPITAL_BASED_OUTPATIENT_CLINIC_OR_DEPARTMENT_OTHER): Payer: Medicare Other

## 2012-08-17 ENCOUNTER — Other Ambulatory Visit: Payer: Self-pay | Admitting: Adult Health

## 2012-08-17 VITALS — BP 142/70 | HR 75 | Temp 98.2°F

## 2012-08-17 DIAGNOSIS — IMO0001 Reserved for inherently not codable concepts without codable children: Secondary | ICD-10-CM

## 2012-08-17 DIAGNOSIS — C50419 Malignant neoplasm of upper-outer quadrant of unspecified female breast: Secondary | ICD-10-CM

## 2012-08-17 MED ORDER — PEGFILGRASTIM INJECTION 6 MG/0.6ML
6.0000 mg | Freq: Once | SUBCUTANEOUS | Status: AC
  Administered 2012-08-17: 6 mg via SUBCUTANEOUS
  Filled 2012-08-17: qty 0.6

## 2012-08-17 MED ORDER — OMEPRAZOLE 40 MG PO CPDR: 40.0000 mg | DELAYED_RELEASE_CAPSULE | Freq: Every day | ORAL | Status: DC

## 2012-08-23 ENCOUNTER — Telehealth: Payer: Self-pay | Admitting: *Deleted

## 2012-08-23 ENCOUNTER — Other Ambulatory Visit (HOSPITAL_BASED_OUTPATIENT_CLINIC_OR_DEPARTMENT_OTHER): Payer: Medicare Other | Admitting: Lab

## 2012-08-23 ENCOUNTER — Ambulatory Visit (HOSPITAL_BASED_OUTPATIENT_CLINIC_OR_DEPARTMENT_OTHER): Payer: Medicare Other

## 2012-08-23 ENCOUNTER — Ambulatory Visit (HOSPITAL_BASED_OUTPATIENT_CLINIC_OR_DEPARTMENT_OTHER): Payer: Medicare Other | Admitting: Adult Health

## 2012-08-23 ENCOUNTER — Encounter: Payer: Self-pay | Admitting: Adult Health

## 2012-08-23 VITALS — BP 156/81 | HR 101 | Temp 98.0°F | Resp 20 | Ht 64.0 in | Wt 138.6 lb

## 2012-08-23 DIAGNOSIS — D709 Neutropenia, unspecified: Secondary | ICD-10-CM

## 2012-08-23 DIAGNOSIS — Z170 Estrogen receptor positive status [ER+]: Secondary | ICD-10-CM

## 2012-08-23 DIAGNOSIS — Z5112 Encounter for antineoplastic immunotherapy: Secondary | ICD-10-CM

## 2012-08-23 DIAGNOSIS — C50419 Malignant neoplasm of upper-outer quadrant of unspecified female breast: Secondary | ICD-10-CM

## 2012-08-23 DIAGNOSIS — C773 Secondary and unspecified malignant neoplasm of axilla and upper limb lymph nodes: Secondary | ICD-10-CM

## 2012-08-23 DIAGNOSIS — C50411 Malignant neoplasm of upper-outer quadrant of right female breast: Secondary | ICD-10-CM

## 2012-08-23 LAB — COMPREHENSIVE METABOLIC PANEL (CC13)
ALT: 20 U/L (ref 0–55)
AST: 19 U/L (ref 5–34)
Albumin: 3.9 g/dL (ref 3.5–5.0)
Alkaline Phosphatase: 96 U/L (ref 40–150)
BUN: 17.1 mg/dL (ref 7.0–26.0)
CO2: 25 mEq/L (ref 22–29)
Calcium: 9.3 mg/dL (ref 8.4–10.4)
Chloride: 96 mEq/L — ABNORMAL LOW (ref 98–107)
Creatinine: 0.8 mg/dL (ref 0.6–1.1)
Glucose: 85 mg/dl (ref 70–99)
Potassium: 3.9 mEq/L (ref 3.5–5.1)
Sodium: 132 mEq/L — ABNORMAL LOW (ref 136–145)
Total Bilirubin: 0.41 mg/dL (ref 0.20–1.20)
Total Protein: 6.9 g/dL (ref 6.4–8.3)

## 2012-08-23 LAB — CBC WITH DIFFERENTIAL/PLATELET
BASO%: 0.3 % (ref 0.0–2.0)
Basophils Absolute: 0 10*3/uL (ref 0.0–0.1)
EOS%: 0 % (ref 0.0–7.0)
Eosinophils Absolute: 0 10*3/uL (ref 0.0–0.5)
HCT: 31.9 % — ABNORMAL LOW (ref 34.8–46.6)
HGB: 10.8 g/dL — ABNORMAL LOW (ref 11.6–15.9)
LYMPH%: 41.2 % (ref 14.0–49.7)
MCH: 30.9 pg (ref 25.1–34.0)
MCHC: 33.9 g/dL (ref 31.5–36.0)
MCV: 91.1 fL (ref 79.5–101.0)
MONO#: 1.2 10*3/uL — ABNORMAL HIGH (ref 0.1–0.9)
MONO%: 35.1 % — ABNORMAL HIGH (ref 0.0–14.0)
NEUT#: 0.8 10*3/uL — ABNORMAL LOW (ref 1.5–6.5)
NEUT%: 23.4 % — ABNORMAL LOW (ref 38.4–76.8)
Platelets: 163 10*3/uL (ref 145–400)
RBC: 3.5 10*6/uL — ABNORMAL LOW (ref 3.70–5.45)
RDW: 18.4 % — ABNORMAL HIGH (ref 11.2–14.5)
WBC: 3.3 10*3/uL — ABNORMAL LOW (ref 3.9–10.3)
lymph#: 1.4 10*3/uL (ref 0.9–3.3)
nRBC: 0 % (ref 0–0)

## 2012-08-23 MED ORDER — HEPARIN SOD (PORK) LOCK FLUSH 100 UNIT/ML IV SOLN
500.0000 [IU] | Freq: Once | INTRAVENOUS | Status: AC | PRN
  Administered 2012-08-23: 500 [IU]
  Filled 2012-08-23: qty 5

## 2012-08-23 MED ORDER — SODIUM CHLORIDE 0.9 % IJ SOLN
10.0000 mL | INTRAMUSCULAR | Status: DC | PRN
  Administered 2012-08-23: 10 mL
  Filled 2012-08-23: qty 10

## 2012-08-23 MED ORDER — SODIUM CHLORIDE 0.9 % IV SOLN
Freq: Once | INTRAVENOUS | Status: AC
  Administered 2012-08-23: 12:00:00 via INTRAVENOUS

## 2012-08-23 MED ORDER — TRASTUZUMAB CHEMO INJECTION 440 MG
2.0000 mg/kg | Freq: Once | INTRAVENOUS | Status: AC
  Administered 2012-08-23: 126 mg via INTRAVENOUS
  Filled 2012-08-23: qty 6

## 2012-08-23 MED ORDER — DIPHENHYDRAMINE HCL 25 MG PO CAPS
50.0000 mg | ORAL_CAPSULE | Freq: Once | ORAL | Status: AC
  Administered 2012-08-23: 50 mg via ORAL

## 2012-08-23 MED ORDER — ACETAMINOPHEN 325 MG PO TABS
650.0000 mg | ORAL_TABLET | Freq: Once | ORAL | Status: AC
  Administered 2012-08-23: 650 mg via ORAL

## 2012-08-23 NOTE — Patient Instructions (Addendum)
  Patient Neutropenia Instruction Sheet  Diagnosis: Breast Cancer      Treating Physician: Drue Second, MD  Treatment: 1. Type of chemotherapy: TCH 2. Date of last treatment: 08/16/12  Last Blood Counts: Lab Results  Component Value Date   WBC 3.3* 08/23/2012   HGB 10.8* 08/23/2012   HCT 31.9* 08/23/2012   MCV 91.1 08/23/2012   PLT 163 08/23/2012        Prophylactic Antibiotics: Cipro 500 mg by mouth twice a day Instructions: 1. Monitor temperature and call if fever  greater than 100.5, chills, shaking chills (rigors) 2. Call Physician on-call at [phone] 3. Give him/her symptoms and list of medications that you are taking and your last blood count.

## 2012-08-23 NOTE — Patient Instructions (Addendum)
[facility] Discharge Instructions for Patients Receiving Chemotherapy  Today you received the following chemotherapy agents Hereptin  To help prevent nausea and vomiting after your treatment, we encourage you to take your nausea medication as prescribed.  If you develop nausea and vomiting that is not controlled by your nausea medication, call the clinic. If it is after clinic hours your family physician or the after hours number for the clinic or go to the Emergency Department.   BELOW ARE SYMPTOMS THAT SHOULD BE REPORTED IMMEDIATELY:  *FEVER GREATER THAN 100.5 F  *CHILLS WITH OR WITHOUT FEVER  NAUSEA AND VOMITING THAT IS NOT CONTROLLED WITH YOUR NAUSEA MEDICATION  *UNUSUAL SHORTNESS OF BREATH  *UNUSUAL BRUISING OR BLEEDING  TENDERNESS IN MOUTH AND THROAT WITH OR WITHOUT PRESENCE OF ULCERS  *URINARY PROBLEMS  *BOWEL PROBLEMS  UNUSUAL RASH Items with * indicate a potential emergency and should be followed up as soon as possible.  One of the nurses will contact you 24 hours after your treatment. Please let the nurse know about any problems that you may have experienced. Feel free to call the clinic you have any questions or concerns. The clinic phone number is [phone].   I have been informed and understand all the instructions given to me. I know to contact the clinic, my physician, or go to the Emergency Department if any problems should occur. I do not have any questions at this time, but understand that I may call the clinic during office hours   should I have any questions or need assistance in obtaining follow up care.    __________________________________________  _____________  __________ Signature of Patient or Authorized Representative            Date                   Time    __________________________________________ Nurse's Signature     Neutropenia Neutropenia is a condition that occurs when the level of a certain type of white blood  cell (neutrophil) in your body becomes lower than normal. Neutrophils are made in the bone marrow and fight infections. These cells protect against bacteria and viruses. The fewer neutrophils you have, and the longer your body remains without them, the greater your risk of getting a severe infection becomes. CAUSES  The cause of neutropenia may be hard to determine. However, it is usually due to 3 main problems:   Decreased production of neutrophils. This may be due to:  Certain medicines such as chemotherapy.  Genetic problems.  Cancer.  Radiation treatments.  Vitamin deficiency.  Some pesticides.  Increased destruction of neutrophils. This may be due to:  Overwhelming infections.  Hemolytic anemia. This is when the body destroys its own blood cells.  Chemotherapy.  Neutrophils moving to areas of the body where they cannot fight infections. This may be due to:  Dialysis procedures.  Conditions where the spleen becomes enlarged. Neutrophils are held in the spleen and are not available to the rest of the body.  Overwhelming infections. The neutrophils are held in the area of the infection and are not available to the rest of the body. SYMPTOMS  There are no specific symptoms of neutropenia. The lack of neutrophils can result in an infection, and an infection can cause various problems. DIAGNOSIS  Diagnosis is made by a blood test. A complete blood count is performed. The normal level of neutrophils in human blood differs with age and race. Infants have lower counts than  older children and adults. African Americans have lower counts than Caucasians or Asians. The average adult level is 1500 cells/mm3 of blood. Neutrophil counts are interpreted as follows:  Greater than 1000 cells/mm3 gives normal protection against infection.  500 to 1000 cells/mm3 gives an increased risk for infection.  200 to 500 cells/mm3 is a greater risk for severe infection.  Lower than 200 cells/mm3  is a marked risk of infection. This may require hospitalization and treatment with antibiotic medicines. TREATMENT  Treatment depends on the underlying cause, severity, and presence of infections or symptoms. It also depends on your health. Your caregiver will discuss the treatment plan with you. Mild cases are often easily treated and have a good outcome. Preventative measures may also be started to limit your risk of infections. Treatment can include:  Taking antibiotics.  Stopping medicines that are known to cause neutropenia.  Correcting nutritional deficiencies by eating green vegetables to supply folic acid and taking vitamin B supplements.  Stopping exposure to pesticides if your neutropenia is related to pesticide exposure.  Taking a blood growth factor called sargramostim, pegfilgrastim, or filgrastim if you are undergoing chemotherapy for cancer. This stimulates white blood cell production.  Removal of the spleen if you have Felty's syndrome and have repeated infections. HOME CARE INSTRUCTIONS   Follow your caregiver's instructions about when you need to have blood work done.  Wash your hands often. Make sure others who come in contact with you also wash their hands.  Wash raw fruits and vegetables before eating them. They can carry bacteria and fungi.  Avoid people with colds or spreadable (contagious) diseases (chickenpox, herpes zoster, influenza).  Avoid large crowds.  Avoid construction areas. The dust can release fungus into the air.  Be cautious around children in daycare or school environments.  Take care of your respiratory system by coughing and deep breathing.  Bathe daily.  Protect your skin from cuts and burns.  Do not work in the garden or with flowers and plants.  Care for the mouth before and after meals by brushing with a soft toothbrush. If you have mucositis, do not use mouthwash. Mouthwash contains alcohol and can dry out the mouth even  more.  Clean the area between the genitals and the anus (perineal area) after urination and bowel movements. Women need to wipe from front to back.  Use a water soluble lubricant during sexual intercourse and practice good hygiene after. Do not have intercourse if you are severely neutropenic. Check with your caregiver for guidelines.  Exercise daily as tolerated.  Avoid people who were vaccinated with a live vaccine in the past 30 days. You should not receive live vaccines (polio, typhoid).  Do not provide direct care for pets. Avoid animal droppings. Do not clean litter boxes and bird cages.  Do not share food utensils.  Do not use tampons, enemas, or rectal suppositories unless directed by your caregiver.  Use an electric razor to remove hair.  Wash your hands after handling magazines, letters, and newspapers. SEEK IMMEDIATE MEDICAL CARE IF:   You have a fever.  You have chills or start to shake.  You feel nauseous or vomit.  You develop mouth sores.  You develop aches and pains.  You have redness and swelling around open wounds.  Your skin is warm to the touch.  You have pus coming from your wounds.  You develop swollen lymph nodes.  You feel weak or fatigued.  You develop red streaks on the skin. MAKE SURE  YOU:  Understand these instructions.  Will watch your condition.  Will get help right away if you are not doing well or get worse. Document Released: 10/15/2001 Document Revised: 07/18/2011 Document Reviewed: 11/12/2010 [facility] Patient Information 2013 [place], [place].

## 2012-08-23 NOTE — Progress Notes (Signed)
OFFICE PROGRESS NOTE   Heather Bradford, MD [address] [place] [address] Dr. Chevis Pretty Dr. Chipper Herb  DIAGNOSIS: 67 year old female with invasive lobular breast cancer, stage IIB, receiving adjuvant chemotherapy  PRIOR THERAPY:  #1 patient was originally seen in the multidisciplinary breast clinic on 03/14/2012 for new diagnosis of invasive ductal carcinoma that was HER-2 positive node-positive, ER positive PR negative with Ki-67 of 44%  #2 on MRI patient also had an anterior breast mass that was to be biopsied to decide whether or not she should receive neoadjuvant chemotherapy or adjuvant chemotherapy. Patient's mass was biopsied and it was positive for a malignancy that was HER-2/neu negative.  #3 patient is now status post right mastectomy with axillary lymph node dissection with the final pathology revealing the largest tumor measuring 5 cm, 1.7 cm and 1.6 cm. Tumor was ER positive PR negative HER-2/neu positive with a Ki-67 of 44% 3 of 7 lymph nodes were positive for metastatic disease.  #4 patient will proceed with adjuvant chemotherapy and Herceptin. Chemotherapy will consist of Taxotere carboplatinum given every 21 days with weekly Herceptin. Total of 6 cycles of Taxotere carboplatin will be administered starting on 06/14/2012. Once patient completes her chemotherapy then she will go on to receive radiation therapy along with Herceptin with the Herceptin then being converted to every 3 weeks to finish a total of one year. Because patient's tumor is ER positive she will also be given antiestrogen therapy adjuvantly.  CURRENT THERAPY:Cycle 4 day 8 of TCH  INTERVAL HISTORY: Ms. Naef is doing well today. She is doing essentially well today.  Her facial dryness has resolved with skin cream.  She denies, fevers, chills, constipation, diarrhea, nausea, vomiting.  She is neutropenic.    MEDICAL HISTORY: Past Medical History  Diagnosis Date  . Chronic kidney disease   .  Hx: UTI (urinary tract infection)   . Right shoulder pain   . Right knee pain   . Hypertension     recently started Aldactone   . Hyperlipidemia     but not on meds;diet and exercise controlled  . Dizziness     has been going on for 42months;medical MD aware  . H/O hiatal hernia   . GERD (gastroesophageal reflux disease)     Tums prn  . Hemorrhoids   . Urinary frequency     d/t taking Aldactone  . History of UTI   . Anemia     hx of  . Cataract     immature-not sure of which eye  . Anxiety     r/t updated surgery  . Insomnia     takes Melatoniin daily  . Complication of anesthesia     pt states she is sensitive to meds  . Breast cancer     right  . Skin cancer     ALLERGIES:  is allergic to codeine; keflex; naprosyn; and tussin.  MEDICATIONS:  Current Outpatient Prescriptions  Medication Sig Dispense Refill  . B Complex-C (SUPER B COMPLEX PO) Take 1 each by mouth daily.      . calcium carbonate (TUMS - DOSED IN MG ELEMENTAL CALCIUM) 500 MG chewable tablet Chew 1 tablet by mouth daily.      . Cholecalciferol (VITAMIN D-3 PO) Take 4,000 Units by mouth daily.      . ciprofloxacin (CIPRO) 500 MG tablet       . clindamycin (CLINDAGEL) 1 % gel Apply topically 2 (two) times daily.  30 g  0  .  dexamethasone (DECADRON) 4 MG tablet Take 2 tablets (8 mg total) by mouth 2 (two) times daily with a meal. Take two times a day the day before Taxotere. Then take two times a day starting the day after chemo for 3 days.  36 tablet  5  . lidocaine-prilocaine (EMLA) cream Apply topically as needed.  30 g  7  . Loratadine (CLARITIN PO) Take 1 each by mouth daily.      Marland Kitchen LORazepam (ATIVAN) 0.5 MG tablet Take 1 tablet (0.5 mg total) by mouth every 6 (six) hours as needed (Nausea or vomiting).  30 tablet  0  . magnesium gluconate (MAGONATE) 500 MG tablet Take 500 mg by mouth 2 (two) times daily.      Marland Kitchen omeprazole (PRILOSEC) 40 MG capsule Take 1 capsule (40 mg total) by mouth daily.  30 capsule  6   . ondansetron (ZOFRAN) 8 MG tablet Take 1 tablet (8 mg total) by mouth 2 (two) times daily. Take two times a day starting the day after chemo for 3 days. Then take two times a day as needed for nausea or vomiting.  30 tablet  1  . PREVIDENT 5000 BOOSTER PLUS 1.1 % PSTE Take 1 application by mouth Twice daily.      . Probiotic Product (PROBIOTIC DAILY PO) Take 1 each by mouth.      . prochlorperazine (COMPAZINE) 10 MG tablet Take 1 tablet (10 mg total) by mouth every 6 (six) hours as needed (Nausea or vomiting).  30 tablet  1  . prochlorperazine (COMPAZINE) 25 MG suppository Place 1 suppository (25 mg total) rectally every 12 (twelve) hours as needed for nausea.  12 suppository  3  . spironolactone (ALDACTONE) 25 MG tablet Take 0.5 tablets (12.5 mg total) by mouth daily.  30 tablet  3   No current facility-administered medications for this visit.   Facility-Administered Medications Ordered in Other Visits  Medication Dose Route Frequency Provider Last Rate Last Dose  . heparin lock flush 100 unit/mL  500 Units Intracatheter Once PRN Victorino December, MD      . sodium chloride 0.9 % injection 10 mL  10 mL Intracatheter PRN Victorino December, MD      . trastuzumab (HERCEPTIN) 126 mg in sodium chloride 0.9 % 250 mL chemo infusion  2 mg/kg (Treatment Plan Actual) Intravenous Once Victorino December, MD        SURGICAL HISTORY:  Past Surgical History  Procedure Laterality Date  . Cyst removed from left breast  1970  . Left wrist surgery   2004    with plate  . Colonoscopy    . Esophagogastroduodenoscopy    . Mastectomy, radical  05/11/2011    right   . Mastectomy modified radical  05/10/2012    Procedure: MASTECTOMY MODIFIED RADICAL;  Surgeon: Robyne Askew, MD;  Location[facility] OR;  Service: General;  Laterality: Right;  RIGHT MODIFIED RADICAL MASTECTOMY  . Portacath placement  05/10/2012    Procedure: INSERTION PORT-A-CATH;  Surgeon: Robyne Askew, MD;  Location: [facility] OR;  Service: General;  Laterality:  Left;  . Wrist fracture surgery  2004    REVIEW OF SYSTEMS:  General: fatigue (+), night sweats (-), fever (-), pain (-) Lymph: palpable nodes (-) HEENT: vision changes (-), mucositis (-), gum bleeding (-), epistaxis (-) Cardiovascular: chest pain (-), palpitations (-) Pulmonary: shortness of breath (-), dyspnea on exertion (-), cough (-), hemoptysis (-) GI:  Early satiety (-), melena (-), dysphagia (-),  nausea/vomiting (-), diarrhea (-) GU: dysuria (-), hematuria (-), incontinence (-) Musculoskeletal: joint swelling (-), joint pain (+), back pain (-) Neuro: weakness (-), numbness (-), headache (-), confusion (-) Skin: Rash (-), lesions (-), dryness (-) Psych: depression (-), suicidal/homicidal ideation (-), feeling of hopelessness (-)    PHYSICAL EXAMINATION: Blood pressure 156/81, pulse 101, temperature 98 F (36.7 C), temperature source Oral, resp. rate 20, height 5\' 4"  (1.626 m), weight 138 lb 9.6 oz (62.869 kg). Body mass index is 23.78 kg/(m^2).  General: Patient is a well appearing female in no acute distress HEENT: PERRLA, sclerae anicteric no conjunctival pallor, MMM Neck: supple, no palpable adenopathy Lungs: clear to auscultation bilaterally, no wheezes, rhonchi, or rales Cardiovascular: regular rate rhythm, S1, S2, no murmurs, rubs or gallops Abdomen: Soft, non-tender, non-distended, normoactive bowel sounds, no HSM Extremities: warm and well perfused, no clubbing, cyanosis, or edema Skin: No rashes or lesions, pustular rash on scalp Neuro: Non-focal Breasts: The right breast is status post mastectomy. There is no evidence of local recurrence. The right axilla is benign. The left breast is unremarkable.  ECOG PERFORMANCE STATUS: 1   LABORATORY DATA: Lab Results  Component Value Date   WBC 3.3* 08/23/2012   HGB 10.8* 08/23/2012   HCT 31.9* 08/23/2012   MCV 91.1 08/23/2012   PLT 163 08/23/2012      Chemistry      Component Value Date/Time   NA 131* 08/16/2012 1159    NA 138 05/11/2012 0640   K 3.8 08/16/2012 1159   K 4.7 05/11/2012 0640   CL 98 08/16/2012 1159   CL 99 05/11/2012 0640   CO2 21* 08/16/2012 1159   CO2 20 05/11/2012 0640   BUN 20.6 08/16/2012 1159   BUN 12 05/11/2012 0640   CREATININE 1.0 08/16/2012 1159   CREATININE 0.88 05/11/2012 0640      Component Value Date/Time   CALCIUM 9.5 08/16/2012 1159   CALCIUM 9.7 05/11/2012 0640   ALKPHOS 85 08/16/2012 1159   AST 24 08/16/2012 1159   ALT 28 08/16/2012 1159   BILITOT 0.41 08/16/2012 1159     ADDITIONAL INFORMATION: CHROMOGENIC IN-SITU HYBRIDIZATION (1C - TUMOR WITH CLIP) Interpretation HER-2/NEU BY CISH - NO AMPLIFICATION OF HER-2 DETECTED. THE RATIO OF HER-2: CEP 17 SIGNALS WAS 1.25. Reference range: Ratio: HER2:CEP17 < 1.8 - gene amplification not observed Ratio: HER2:CEP 17 1.8-2.2 - equivocal result Ratio: HER2:CEP17 > 2.2 - gene amplification observed CHROMOGENIC IN-SITU HYBRIDIZATION (1E - TUMOR WITHOUT CLIP) Interpretation: HER2/NEU BY CISH - SHOWS AMPLIFICATION BY CISH ANALYSIS. THE RATIO OF HER2: CEP 17 SIGNALS WAS 2.83. Reference range: Ratio: HER2:CEP17 < 1.8 gene amplification not observed Ratio: HER2:CEP 17 1.8-2.2 - equivocal result Ratio: HER2:CEP17 > 2.2 - gene amplification observed Pecola Leisure MD Pathologist, Electronic Signature ( Signed 05/15/2012) FINAL DIAGNOSIS Diagnosis Breast, modified radical mastectomy , right, with axillary contents 1 of 4 FINAL for ALLIEN, MELBERG S (SZA14-4) Diagnosis(continued) TUMOR #1 (WITH CLIP): - INVASIVE LOBULAR CARCINOMA, GRADE II, SEE COMMENT. - LYMPHOVASCULAR INVASION IDENTIFIED. - PREVIOUS BIOPSY SITE IDENTIFIED. TUMOR #2 (NO CLIP): - INVASIVE LOBULAR CARCINOMA, GRADE II, SEE COMMENT. - LOBULAR CARCINOMA IN SITU. - LYMPHOVASCULAR INVASION IDENTIFIED. - THREE LYMPH NODES, POSITIVE FOR METASTATIC MAMMARY CARCINOMA (3/7). - TUMOR DEPOSITS ARE 1.7, 1.6, AND 5 CM. - EXTRACAPSULAR TUMOR EXTENSION IDENTIFIED. - TWO LYMPH NODES,  POSITIVE FOR ISOLATED TUMOR CELLS. Microscopic Comment BREAST, INVASIVE TUMOR, WITH LYMPH NODE SAMPLING Specimen, including laterality: Right breast Procedure: Modified radical mastectomy Grade: Tumor 1 and tumor 2:  Grade II of III Tubule formation: 3 Nuclear pleomorphism: 2 Mitotic: 1 Tumor size (gross measurement: Tumor #1- 2.3 cm; tumor #2 - 2.4 cm Margins: Invasive, distance to closest margin: Tumor #1 2.7 cm - deep margin; tumor #2 - 1.6 cm - deep margin Lymphovascular invasion: Present Ductal carcinoma in situ: Absent Grade: N/A Extensive intraductal component: N/A Lobular neoplasia: Both atypical hyperplasia and in situ carcinoma Extent of tumor: Skin: Grossly negative Nipple: Grossly negative Skeletal muscle: Microscopically negative Lymph nodes: # examined: 7 Lymph nodes with metastasis: 3 Isolated tumor cells (< 0.2 mm): two lymph nodes (Slide 1P and 1O) Micrometastasis: (> 0.2 mm and < 2.0 mm): none Macrometastasis: (> 2.0 mm): 1.7 cm , 1.6 cm and 5 cm Extracapsular extension: Present Breast prognostic profile: Tumor #1: Estrogen receptor: Not repeated, previous study demonstrated 98% positivity ([id number]) Progesterone receptor: Not repeated, previous study demonstrated 0% positivity ([id number]) Her 2 neu: Repeated, previous study demonstrated no amplification (1.38) ([id number]) Ki-67: Not repeated, previous study demonstrated 5% proliferation rate ([id number]) Tumor #2: Estrogen receptor: Not repeated, previous study demonstrated 97% positivity ([id number]) Progesterone receptor: Not repeated, previous study demonstrated 0% positivity ([id number]) Her 2 neu: Repeated, previous study demonstrated amplification (3.65) ([id number]) Ki-67: Not repeated, previous study demonstrated 44% proliferation rate ([id number]) 2 of 4 FINAL for ELEASHA, CATALDO (SZA14-4) Microscopic Comment(continued) Non-neoplastic breast: Benign fibrocystic change with usual ductal  hyperplasia, sclerosing adenosis, benign radial sclerosing lesion, and microcalcifications in benign ducts and lobules. TNM: mpT2, pN1a, pMX. Comments: none  RADIOGRAPHIC STUDIES:  Mr Lodema Pilot Contrast  03/23/2012  *RADIOLOGY REPORT*  Clinical Data: New diagnosis breast cancer.  Rule out metastatic disease.  MRI HEAD WITHOUT AND WITH CONTRAST  Technique:  Multiplanar, multiecho pulse sequences of the brain and surrounding structures were obtained according to standard protocol without and with intravenous contrast  Contrast: 12mL MULTIHANCE GADOBENATE DIMEGLUMINE 529 MG/ML IV SOLN  Comparison: None.  Findings: Ventricle size is normal.  Small 4 mm hyperintensities in the cerebral white matter bilaterally do not enhance and are most likely due to chronic microvascular ischemia.  Negative for acute infarct.  Diffusion weighted imaging is normal.  Pituitary is normal in size.  Negative for intracranial hemorrhage.  Following contrast infusion, no enhancing lesions are identified.  Paranasal sinuses are clear.  Vessels at the base of the brain are patent.  IMPRESSION: Negative for metastatic disease.  No acute abnormality.   Original Report Authenticated By: Janeece Riggers, M.D.    Mr Breast Bilateral W Wo Contrast  03/12/2012  *RADIOLOGY REPORT*  Clinical Data: Recently diagnosed invasive mammary carcinoma in the 10 o'clock region of the right breast with a metastatic right axillary lymph node.  BILATERAL BREAST MRI WITH AND WITHOUT CONTRAST  Technique: Multiplanar, multisequence MR images of both breasts were obtained prior to and following the intravenous administration of 12ml of multihance.  Three dimensional images were evaluated at the independent DynaCad workstation.  Comparison:  Mammograms dated 03/05/2012, 03/01/2012, 03/01/2011 and 02/26/2010 from [facility].  Findings: There is a moderate background parenchymal enhancement pattern.  Right breast: 1.  There is a lobulated 2.2 x 1.7 x 1.8  cm mass in the posterior third of the upper outer quadrant of the right breast.  There is a post biopsy clip artifact 1 cm lateral to the mass. 2.  In the anterior third upper outer quadrant of the right breast there is patchy non mass like enhancement measuring 0.8 x 0.3 x 0.3 cm.  Left breast:  There  is no abnormal enhancement the left breast.  There are enlarged right axillary lymph nodes.  The largest right axillary lymph node measures 2.4 cm and is located lateral to the pectoralis minor consistent with level I adenopathy.  There is a 1.6 cm lymph node behind the pectoralis minor consistent with enlarged level II adenopathy.  IMPRESSION:  1.  2.2 cm mass in the posterior third of the upper outer quadrant of the right breast corresponding with the known invasive mammary carcinoma. 2.  Patchy non mass-like enhancement in the anterior third of the upper outer quadrant of the right breast.  MR guided core biopsy is recommended. 3. Enlarged level I and II right axillary adenopathy.  RECOMMENDATION: MR guided core biopsy of the right breast.  THREE-DIMENSIONAL MR IMAGE RENDERING ON INDEPENDENT WORKSTATION:  Three-dimensional MR images were rendered by post-processing of the original MR data on an independent workstation.  The three- dimensional MR images were interpreted, and findings were reported in the accompanying complete MRI report for this study.  BI-RADS CATEGORY 4:  Suspicious abnormality - biopsy should be considered.   Original Report Authenticated By: Baird Lyons, M.D.    Mr Biopsy/wire Localization  03/20/2012  *RADIOLOGY REPORT*  Clinical Data:  Known right breast carcinoma.  Additional area of worrisome enhancement located within the anterior upper outer quadrant of the right breast.  MRI GUIDED VACUUM ASSISTED BIOPSY OF THE RIGHT BREAST WITHOUT AND WITH CONTRAST  Comparison: Previous exams.  Technique: Multiplanar, multisequence MR images of the right breast were obtained prior to and following the  intravenous administration of 12 ml of Mulithance.  I met with the patient, and we discussed the procedure of MRI guided biopsy, including risks, benefits, and alternatives. Specifically, we discussed the risks of infection, bleeding, tissue injury, clip migration, and inadequate sampling.  Informed, written consent was given.  Using sterile technique, 2% Lidocaine, MRI guidance, and a 9 gauge vacuum assisted device, biopsy was performed of the area of enhancement located within the anterior portion of the upper-outer quadrant - right breast using a lateral approach.  At the conclusion of the procedure, a  tissue marker clip was deployed into the biopsy cavity.  IMPRESSION: MRI guided biopsy of the area of enhancement located within the anterior portion of the upper-outer quadrant of the right breast as discussed above. No apparent complications.  BI-RADS CATEGORY 6:  Known biopsy-proven malignancy - appropriate action should be taken.  THREE-DIMENSIONAL MR IMAGE RENDERING ON INDEPENDENT WORKSTATION:  Three-dimensional MR images were rendered by post-processing of the original MR data on an independent workstation.  The three- dimensional MR images were interpreted, and findings were reported in the accompanying complete MRI report for this study.  The pathology associated with the right breast MR guided core biopsy demonstrated invasive mammary carcinoma and in situ mammary carcinoma.  This is concordant with imaging findings.  I have discussed findings with the patient by telephone and answered her questions.  The patient states that biopsy site is clean and dry without hematoma formation or signs of infection.  Post biopsy wound care instructions were reviewed with the patient.  Follow-up will be with Dr. Carolynne Edouard and Dr. Welton Flakes regards her treatment plan.  The patient was encouraged to call the Breast Center for additional questions or concerns   Original Report Authenticated By: Rolla Plate, M.D.    Nm Pet  Image Initial (pi) Skull Base To Thigh  03/22/2012  *RADIOLOGY REPORT*  Clinical Data: Initial treatment strategy for high risk right breast  cancer.  NUCLEAR MEDICINE PET SKULL BASE TO THIGH  Fasting Blood Glucose:  100  Technique:  18.6 mCi F-18 FDG was injected intravenously. CT data was obtained and used for attenuation correction and anatomic localization only.  (This was not acquired as a diagnostic CT examination.) Additional exam technical data entered on technologist worksheet.  Comparison:  MRI breast dated 03/12/2012  Findings:  Neck: No hypermetabolic lymph nodes in the neck.  Chest:  Vague hypermetabolism in the upper/central right breast, max SUV 2.7.  Additional focal hypermetabolism in the upper/outer right breast, max SUV 2.3 (PET image 105).  These findings correspond to biopsy-proven primary breast carcinoma.  1.3 cm short axis right subpectoral lymph node (series 2/image 66), max SUV 3.1.  Right axillary lymphadenopathy measuring up to 1.6 cm short axis (series 2/image 88), max SUV 4.8).  No hypermetabolic mediastinal or hilar nodes.  No suspicious pulmonary nodules on the CT scan.  Abdomen/Pelvis:  No abnormal hypermetabolic activity within the liver, pancreas, adrenal glands, or spleen.  No hypermetabolic lymph nodes in the abdomen or pelvis.  Skeleton:  No focal hypermetabolic activity to suggest skeletal metastasis.  IMPRESSION: Two foci of hypermetabolism in the upper right breast, max SUV 2.7, corresponding to biopsy-proven primary breast carcinoma.  Associated right subpectoral and axillary nodal metastases, max SUV 4.8.  No evidence of distant metastases.   Original Report Authenticated By: Charline Bills, M.D.    Mm Digital Diagnostic Unilat R  03/20/2012  *RADIOLOGY REPORT*  Clinical Data:  Post right breast MR guided core biopsy.  DIGITAL DIAGNOSTIC RIGHT BREAST MAMMOGRAM  Comparison:  03/05/2012, 03/01/2012, 03/01/2011, 02/26/2010 mammograms from [facility] imaging.  Findings:  Films  are performed following MR guided biopsy of the anterior portion of the upper-outer quadrant of the right breast. The hourglass shaped clip placed during the MR guided core biopsy appears in appropriate position.  IMPRESSION: Appropriate positioning of clip following right breast MR guided core biopsy.   Original Report Authenticated By: Rolla Plate, M.D.     ASSESSMENT: 67 year old [place], [place] woman  #1  status post mastectomy with right axillary lymph node dissection 05/10/2012 for an mpT2 N1, stage stage IIB invasive lobular breast cancer, stage IIB, ER positive, HER-2/neu positive, with an Mib-1 between 5% and 44%  #2 receiving adjuvant chemotherapy consisting of Taxotere, carboplatinum and Herceptin given every 21 days for total of 6 cycles of Taxotere carboplatinum and weekly Herceptin beginning on 06/14/12.  #3 Herceptin to be continued to total one year; most recent echo 03/28/2012  PLAN:   1. Ms. Cancel is doing well.  She will proceed with Herceptin only.  She is neutropenic and will have her cipro refilled. I reviewed neutropenic precautions with her in detail and gave her information in her after visit summary including her lab values, reasons to call, and the phone number.  She verbalized understanding.  2. She will continue to take Super B complex daily.  She doesn't have any more numbness.    3. Her face rash is likely due to dry skin. Resolved.  All questions were answered. The patient knows to call the clinic with any problems, questions or concerns. We can certainly see the patient much sooner if necessary.   I spent 25 minutes counseling the patient face to face. The total time spent in the appointment was 30 minutes.  Cherie Ouch Lyn Hollingshead, NP Medical Oncology [facility] Phone: [phone] 08/23/2012, 12:59 PM

## 2012-08-23 NOTE — Telephone Encounter (Signed)
Per staff phone call and POF I have schedueld appts.  JMW  

## 2012-08-23 NOTE — Telephone Encounter (Signed)
appts made and printed...td 

## 2012-08-30 ENCOUNTER — Ambulatory Visit (HOSPITAL_BASED_OUTPATIENT_CLINIC_OR_DEPARTMENT_OTHER): Payer: Medicare Other

## 2012-08-30 ENCOUNTER — Encounter: Payer: Self-pay | Admitting: Adult Health

## 2012-08-30 ENCOUNTER — Other Ambulatory Visit (HOSPITAL_BASED_OUTPATIENT_CLINIC_OR_DEPARTMENT_OTHER): Payer: Medicare Other | Admitting: Lab

## 2012-08-30 ENCOUNTER — Ambulatory Visit (HOSPITAL_BASED_OUTPATIENT_CLINIC_OR_DEPARTMENT_OTHER): Payer: Medicare Other | Admitting: Adult Health

## 2012-08-30 VITALS — BP 135/75 | HR 84 | Temp 98.2°F | Resp 20 | Ht 64.0 in | Wt 141.1 lb

## 2012-08-30 DIAGNOSIS — C50411 Malignant neoplasm of upper-outer quadrant of right female breast: Secondary | ICD-10-CM

## 2012-08-30 DIAGNOSIS — Z170 Estrogen receptor positive status [ER+]: Secondary | ICD-10-CM

## 2012-08-30 DIAGNOSIS — C50419 Malignant neoplasm of upper-outer quadrant of unspecified female breast: Secondary | ICD-10-CM

## 2012-08-30 DIAGNOSIS — D696 Thrombocytopenia, unspecified: Secondary | ICD-10-CM

## 2012-08-30 DIAGNOSIS — Z5112 Encounter for antineoplastic immunotherapy: Secondary | ICD-10-CM

## 2012-08-30 LAB — CBC WITH DIFFERENTIAL/PLATELET
BASO%: 0.3 % (ref 0.0–2.0)
Basophils Absolute: 0 10*3/uL (ref 0.0–0.1)
EOS%: 0.1 % (ref 0.0–7.0)
Eosinophils Absolute: 0 10*3/uL (ref 0.0–0.5)
HCT: 29.7 % — ABNORMAL LOW (ref 34.8–46.6)
HGB: 9.9 g/dL — ABNORMAL LOW (ref 11.6–15.9)
LYMPH%: 12.4 % — ABNORMAL LOW (ref 14.0–49.7)
MCH: 30.9 pg (ref 25.1–34.0)
MCHC: 33.3 g/dL (ref 31.5–36.0)
MCV: 92.8 fL (ref 79.5–101.0)
MONO#: 0.5 10*3/uL (ref 0.1–0.9)
MONO%: 4.6 % (ref 0.0–14.0)
NEUT#: 9 10*3/uL — ABNORMAL HIGH (ref 1.5–6.5)
NEUT%: 82.6 % — ABNORMAL HIGH (ref 38.4–76.8)
Platelets: 68 10*3/uL — ABNORMAL LOW (ref 145–400)
RBC: 3.2 10*6/uL — ABNORMAL LOW (ref 3.70–5.45)
RDW: 19.6 % — ABNORMAL HIGH (ref 11.2–14.5)
WBC: 10.9 10*3/uL — ABNORMAL HIGH (ref 3.9–10.3)
lymph#: 1.4 10*3/uL (ref 0.9–3.3)
nRBC: 0 % (ref 0–0)

## 2012-08-30 LAB — COMPREHENSIVE METABOLIC PANEL (CC13)
ALT: 30 U/L (ref 0–55)
AST: 24 U/L (ref 5–34)
Albumin: 3.4 g/dL — ABNORMAL LOW (ref 3.5–5.0)
Alkaline Phosphatase: 92 U/L (ref 40–150)
BUN: 11.1 mg/dL (ref 7.0–26.0)
CO2: 25 mEq/L (ref 22–29)
Calcium: 8.8 mg/dL (ref 8.4–10.4)
Chloride: 101 mEq/L (ref 98–107)
Creatinine: 1 mg/dL (ref 0.6–1.1)
Glucose: 130 mg/dl — ABNORMAL HIGH (ref 70–99)
Potassium: 3.7 mEq/L (ref 3.5–5.1)
Sodium: 135 mEq/L — ABNORMAL LOW (ref 136–145)
Total Bilirubin: 0.38 mg/dL (ref 0.20–1.20)
Total Protein: 6.5 g/dL (ref 6.4–8.3)

## 2012-08-30 MED ORDER — HEPARIN SOD (PORK) LOCK FLUSH 100 UNIT/ML IV SOLN
500.0000 [IU] | Freq: Once | INTRAVENOUS | Status: DC | PRN
  Filled 2012-08-30: qty 5

## 2012-08-30 MED ORDER — SODIUM CHLORIDE 0.9 % IV SOLN
Freq: Once | INTRAVENOUS | Status: AC
  Administered 2012-08-30: 13:00:00 via INTRAVENOUS

## 2012-08-30 MED ORDER — SODIUM CHLORIDE 0.9 % IJ SOLN
10.0000 mL | INTRAMUSCULAR | Status: DC | PRN
  Filled 2012-08-30: qty 10

## 2012-08-30 MED ORDER — TRASTUZUMAB CHEMO INJECTION 440 MG
2.0000 mg/kg | Freq: Once | INTRAVENOUS | Status: AC
  Administered 2012-08-30: 126 mg via INTRAVENOUS
  Filled 2012-08-30: qty 6

## 2012-08-30 MED ORDER — DIPHENHYDRAMINE HCL 25 MG PO CAPS
50.0000 mg | ORAL_CAPSULE | Freq: Once | ORAL | Status: AC
  Administered 2012-08-30: 50 mg via ORAL

## 2012-08-30 MED ORDER — ACETAMINOPHEN 325 MG PO TABS
650.0000 mg | ORAL_TABLET | Freq: Once | ORAL | Status: AC
  Administered 2012-08-30: 650 mg via ORAL

## 2012-08-30 NOTE — Patient Instructions (Signed)
Doing well.  Proceed with Herceptin.  Please call us if you have any questions or concerns.    We will see you back next week for chemotherapy.

## 2012-08-30 NOTE — Patient Instructions (Signed)
Patient aware of next appointment; discharged home with no complaints. 

## 2012-08-30 NOTE — Progress Notes (Signed)
OFFICE PROGRESS NOTE   Heather Bradford, MD [address] [place] [address] Dr. Chevis Pretty Dr. Chipper Herb  DIAGNOSIS: 67 year old female with invasive lobular breast cancer, stage IIB, receiving adjuvant chemotherapy  PRIOR THERAPY:  #1 patient was originally seen in the multidisciplinary breast clinic on 03/14/2012 for new diagnosis of invasive ductal carcinoma that was HER-2 positive node-positive, ER positive PR negative with Ki-67 of 44%  #2 on MRI patient also had an anterior breast mass that was to be biopsied to decide whether or not she should receive neoadjuvant chemotherapy or adjuvant chemotherapy. Patient'[name] mass was biopsied and it was positive for a malignancy that was HER-2/neu negative.  #3 patient is now status post right mastectomy with axillary lymph node dissection with the final pathology revealing the largest tumor measuring 5 cm, 1.7 cm and 1.6 cm. Tumor was ER positive PR negative HER-2/neu positive with a Ki-67 of 44% 3 of 7 lymph nodes were positive for metastatic disease.  #4 patient will proceed with adjuvant chemotherapy and Herceptin. Chemotherapy will consist of Taxotere carboplatinum given every 21 days with weekly Herceptin. Total of 6 cycles of Taxotere carboplatin will be administered starting on 06/14/2012. Once patient completes her chemotherapy then she will go on to receive radiation therapy along with Herceptin with the Herceptin then being converted to every 3 weeks to finish a total of one year. Because patient'[name] tumor is ER positive she will also be given antiestrogen therapy adjuvantly.  CURRENT THERAPY:Cycle 4 day 15 of TCH  INTERVAL HISTORY: Heather [name] is here for f/u prior to her adjuvant treatment of her right breast cancer.  She is doing well.  She has been working on regaining her strength and exercising this cycle, and did experience more fatigue these past 2 weeks.  She also has noticed blood clots when blowing her nose, which does  correspond with her thrombocytopenia.  This started yesterday.  She continues to have watery eyes.  Otherwise, she denies fevers, chills, nausea, vomiting, constipation, diarrhea.  The numbness is very minimal and in bilateral thumbs.  She is without numbness in the toes.  She continues to take Super B complex daily.  She is eating well, but does endorse taste changes that are unpleasant. Her fluid intake consists of 32 ounces of water, flavored water, and other beverages.  Otherwise, a 10 point ROS is neg.  MEDICAL HISTORY: Past Medical History  Diagnosis Date  . Chronic kidney disease   . Hx: UTI (urinary tract infection)   . Right shoulder pain   . Right knee pain   . Hypertension     recently started Aldactone   . Hyperlipidemia     but not on meds;diet and exercise controlled  . Dizziness     has been going on for 27months;medical MD aware  . H/O hiatal hernia   . GERD (gastroesophageal reflux disease)     Tums prn  . Hemorrhoids   . Urinary frequency     d/t taking Aldactone  . History of UTI   . Anemia     hx of  . Cataract     immature-not sure of which eye  . Anxiety     r/t updated surgery  . Insomnia     takes Melatoniin daily  . Complication of anesthesia     pt states she is sensitive to meds  . Breast cancer     right  . Skin cancer     ALLERGIES:  is allergic to  codeine; keflex; naprosyn; and tussin.  MEDICATIONS:  Current Outpatient Prescriptions  Medication Sig Dispense Refill  . B Complex-C (SUPER B COMPLEX PO) Take 1 each by mouth daily.      . calcium carbonate (TUMS - DOSED IN MG ELEMENTAL CALCIUM) 500 MG chewable tablet Chew 1 tablet by mouth daily.      . Cholecalciferol (VITAMIN D-3 PO) Take 4,000 Units by mouth daily.      . ciprofloxacin (CIPRO) 500 MG tablet       . clindamycin (CLINDAGEL) 1 % gel Apply topically 2 (two) times daily.  30 g  0  . dexamethasone (DECADRON) 4 MG tablet Take 2 tablets (8 mg total) by mouth 2 (two) times daily with a  meal. Take two times a day the day before Taxotere. Then take two times a day starting the day after chemo for 3 days.  36 tablet  5  . lidocaine-prilocaine (EMLA) cream Apply topically as needed.  30 g  7  . Loratadine (CLARITIN PO) Take 1 each by mouth daily.      Marland Kitchen LORazepam (ATIVAN) 0.5 MG tablet Take 1 tablet (0.5 mg total) by mouth every 6 (six) hours as needed (Nausea or vomiting).  30 tablet  0  . magnesium gluconate (MAGONATE) 500 MG tablet Take 500 mg by mouth 2 (two) times daily.      Marland Kitchen omeprazole (PRILOSEC) 40 MG capsule Take 1 capsule (40 mg total) by mouth daily.  30 capsule  6  . ondansetron (ZOFRAN) 8 MG tablet Take 1 tablet (8 mg total) by mouth 2 (two) times daily. Take two times a day starting the day after chemo for 3 days. Then take two times a day as needed for nausea or vomiting.  30 tablet  1  . PREVIDENT 5000 BOOSTER PLUS 1.1 % PSTE Take 1 application by mouth Twice daily.      . Probiotic Product (PROBIOTIC DAILY PO) Take 1 each by mouth.      . prochlorperazine (COMPAZINE) 10 MG tablet Take 1 tablet (10 mg total) by mouth every 6 (six) hours as needed (Nausea or vomiting).  30 tablet  1  . prochlorperazine (COMPAZINE) 25 MG suppository Place 1 suppository (25 mg total) rectally every 12 (twelve) hours as needed for nausea.  12 suppository  3  . spironolactone (ALDACTONE) 25 MG tablet Take 0.5 tablets (12.5 mg total) by mouth daily.  30 tablet  3   No current facility-administered medications for this visit.    SURGICAL HISTORY:  Past Surgical History  Procedure Laterality Date  . Cyst removed from left breast  1970  . Left wrist surgery   2004    with plate  . Colonoscopy    . Esophagogastroduodenoscopy    . Mastectomy, radical  05/11/2011    right   . Mastectomy modified radical  05/10/2012    Procedure: MASTECTOMY MODIFIED RADICAL;  Surgeon: Robyne Askew, MD;  Location: [facility] OR;  Service: General;  Laterality: Right;  RIGHT MODIFIED RADICAL MASTECTOMY  .  Portacath placement  05/10/2012    Procedure: INSERTION PORT-A-CATH;  Surgeon: Robyne Askew, MD;  Location: [facility] OR;  Service: General;  Laterality: Left;  . Wrist fracture surgery  2004    REVIEW OF SYSTEMS:  General: fatigue (+), night sweats (-), fever (-), pain (-) Lymph: palpable nodes (-) HEENT: vision changes (-), mucositis (-), gum bleeding (-), epistaxis (-) Cardiovascular: chest pain (-), palpitations (-) Pulmonary: shortness of breath (-), dyspnea  on exertion (-), cough (-), hemoptysis (-) GI:  Early satiety (-), melena (-), dysphagia (-), nausea/vomiting (-), diarrhea (-) GU: dysuria (-), hematuria (-), incontinence (-) Musculoskeletal: joint swelling (-), joint pain (-), back pain (-) Neuro: weakness (-), numbness (+), headache (-), confusion (-) Skin: Rash (-), lesions (-), dryness (-) Psych: depression (-), suicidal/homicidal ideation (-), feeling of hopelessness (-)    PHYSICAL EXAMINATION: Blood pressure 135/75, pulse 84, temperature 98.2 F (36.8 C), temperature source Oral, resp. rate 20, height 5\' 4"  (1.626 m), weight 141 lb 1.6 oz (64.003 kg). Body mass index is 24.21 kg/(m^2). General: Patient is a well appearing female in no acute distress HEENT: PERRLA, sclerae anicteric no conjunctival pallor, MMM Neck: supple, no palpable adenopathy Lungs: clear to auscultation bilaterally, no wheezes, rhonchi, or rales Cardiovascular: regular rate rhythm, S1, S2, no murmurs, rubs or gallops Abdomen: Soft, non-tender, non-distended, normoactive bowel sounds, no HSM Extremities: warm and well perfused, no clubbing, cyanosis, or edema Skin: No rashes or lesions, pustular rash on scalp Neuro: Non-focal Breasts: The right breast is status post mastectomy. There is no evidence of local recurrence. The right axilla is benign. The left breast is unremarkable. ECOG PERFORMANCE STATUS: 1   LABORATORY DATA: Lab Results  Component Value Date   WBC 10.9* 08/30/2012   HGB 9.9*  08/30/2012   HCT 29.7* 08/30/2012   MCV 92.8 08/30/2012   PLT 68* 08/30/2012      Chemistry      Component Value Date/Time   NA 132* 08/23/2012 1104   NA 138 05/11/2012 0640   K 3.9 08/23/2012 1104   K 4.7 05/11/2012 0640   CL 96* 08/23/2012 1104   CL 99 05/11/2012 0640   CO2 25 08/23/2012 1104   CO2 20 05/11/2012 0640   BUN 17.1 08/23/2012 1104   BUN 12 05/11/2012 0640   CREATININE 0.8 08/23/2012 1104   CREATININE 0.88 05/11/2012 0640      Component Value Date/Time   CALCIUM 9.3 08/23/2012 1104   CALCIUM 9.7 05/11/2012 0640   ALKPHOS 96 08/23/2012 1104   AST 19 08/23/2012 1104   ALT 20 08/23/2012 1104   BILITOT 0.41 08/23/2012 1104     ADDITIONAL INFORMATION: CHROMOGENIC IN-SITU HYBRIDIZATION (1C - TUMOR WITH CLIP) Interpretation HER-2/NEU BY CISH - NO AMPLIFICATION OF HER-2 DETECTED. THE RATIO OF HER-2: CEP 17 SIGNALS WAS 1.25. Reference range: Ratio: HER2:CEP17 < 1.8 - gene amplification not observed Ratio: HER2:CEP 17 1.8-2.2 - equivocal result Ratio: HER2:CEP17 > 2.2 - gene amplification observed CHROMOGENIC IN-SITU HYBRIDIZATION (1E - TUMOR WITHOUT CLIP) Interpretation: HER2/NEU BY CISH - SHOWS AMPLIFICATION BY CISH ANALYSIS. THE RATIO OF HER2: CEP 17 SIGNALS WAS 2.83. Reference range: Ratio: HER2:CEP17 < 1.8 gene amplification not observed Ratio: HER2:CEP 17 1.8-2.2 - equivocal result Ratio: HER2:CEP17 > 2.2 - gene amplification observed Pecola Leisure MD Pathologist, Electronic Signature ( Signed 05/15/2012) FINAL DIAGNOSIS Diagnosis Breast, modified radical mastectomy , right, with axillary contents 1 of 4 FINAL for Heather [name], Heather [name] ([id number]) Diagnosis(continued) TUMOR #1 (WITH CLIP): - INVASIVE LOBULAR CARCINOMA, GRADE II, SEE COMMENT. - LYMPHOVASCULAR INVASION IDENTIFIED. - PREVIOUS BIOPSY SITE IDENTIFIED. TUMOR #2 (NO CLIP): - INVASIVE LOBULAR CARCINOMA, GRADE II, SEE COMMENT. - LOBULAR CARCINOMA IN SITU. - LYMPHOVASCULAR INVASION IDENTIFIED. - THREE LYMPH NODES, POSITIVE  FOR METASTATIC MAMMARY CARCINOMA (3/7). - TUMOR DEPOSITS ARE 1.7, 1.6, AND 5 CM. - EXTRACAPSULAR TUMOR EXTENSION IDENTIFIED. - TWO LYMPH NODES, POSITIVE FOR ISOLATED TUMOR CELLS. Microscopic Comment BREAST, INVASIVE TUMOR, WITH LYMPH NODE SAMPLING Specimen,  including laterality: Right breast Procedure: Modified radical mastectomy Grade: Tumor 1 and tumor 2: Grade II of III Tubule formation: 3 Nuclear pleomorphism: 2 Mitotic: 1 Tumor size (gross measurement: Tumor #1- 2.3 cm; tumor #2 - 2.4 cm Margins: Invasive, distance to closest margin: Tumor #1 2.7 cm - deep margin; tumor #2 - 1.6 cm - deep margin Lymphovascular invasion: Present Ductal carcinoma in situ: Absent Grade: N/A Extensive intraductal component: N/A Lobular neoplasia: Both atypical hyperplasia and in situ carcinoma Extent of tumor: Skin: Grossly negative Nipple: Grossly negative Skeletal muscle: Microscopically negative Lymph nodes: # examined: 7 Lymph nodes with metastasis: 3 Isolated tumor cells (< 0.2 mm): two lymph nodes (Slide 1P and 1O) Micrometastasis: (> 0.2 mm and < 2.0 mm): none Macrometastasis: (> 2.0 mm): 1.7 cm , 1.6 cm and 5 cm Extracapsular extension: Present Breast prognostic profile: Tumor #1: Estrogen receptor: Not repeated, previous study demonstrated 98% positivity ([id number]) Progesterone receptor: Not repeated, previous study demonstrated 0% positivity ([id number]) Her 2 neu: Repeated, previous study demonstrated no amplification (1.38) ([id number]) Ki-67: Not repeated, previous study demonstrated 5% proliferation rate ([id number]) Tumor #2: Estrogen receptor: Not repeated, previous study demonstrated 97% positivity ([id number]) Progesterone receptor: Not repeated, previous study demonstrated 0% positivity ([id number]) Her 2 neu: Repeated, previous study demonstrated amplification (3.65) ([id number]) Ki-67: Not repeated, previous study demonstrated 44% proliferation rate  ([id number]) 2 of 4 FINAL for Heather [name], Heather [name] ([id number]) Microscopic Comment(continued) Non-neoplastic breast: Benign fibrocystic change with usual ductal hyperplasia, sclerosing adenosis, benign radial sclerosing lesion, and microcalcifications in benign ducts and lobules. TNM: mpT2, pN1a, pMX. Comments: none  RADIOGRAPHIC STUDIES:  Mr Lodema Pilot Contrast  03/23/2012  *RADIOLOGY REPORT*  Clinical Data: New diagnosis breast cancer.  Rule out metastatic disease.  MRI HEAD WITHOUT AND WITH CONTRAST  Technique:  Multiplanar, multiecho pulse sequences of the brain and surrounding structures were obtained according to standard protocol without and with intravenous contrast  Contrast: 12mL MULTIHANCE GADOBENATE DIMEGLUMINE 529 MG/ML IV SOLN  Comparison: None.  Findings: Ventricle size is normal.  Small 4 mm hyperintensities in the cerebral white matter bilaterally do not enhance and are most likely due to chronic microvascular ischemia.  Negative for acute infarct.  Diffusion weighted imaging is normal.  Pituitary is normal in size.  Negative for intracranial hemorrhage.  Following contrast infusion, no enhancing lesions are identified.  Paranasal sinuses are clear.  Vessels at the base of the brain are patent.  IMPRESSION: Negative for metastatic disease.  No acute abnormality.   Original Report Authenticated By: Janeece Riggers, M.D.    Mr Breast Bilateral W Wo Contrast  03/12/2012  *RADIOLOGY REPORT*  Clinical Data: Recently diagnosed invasive mammary carcinoma in the 10 o'clock region of the right breast with a metastatic right axillary lymph node.  BILATERAL BREAST MRI WITH AND WITHOUT CONTRAST  Technique: Multiplanar, multisequence MR images of both breasts were obtained prior to and following the intravenous administration of 12ml of multihance.  Three dimensional images were evaluated at the independent DynaCad workstation.  Comparison:  Mammograms dated 03/05/2012, 03/01/2012, 03/01/2011 and 02/26/2010  from [facility]  Findings: There is a moderate background parenchymal enhancement pattern.  Right breast: 1.  There is a lobulated 2.2 x 1.7 x 1.8 cm mass in the posterior third of the upper outer quadrant of the right breast.  There is a post biopsy clip artifact 1 cm lateral to the mass. 2.  In the anterior third upper outer quadrant of the right breast there is patchy non mass  like enhancement measuring 0.8 x 0.3 x 0.3 cm.  Left breast:  There is no abnormal enhancement the left breast.  There are enlarged right axillary lymph nodes.  The largest right axillary lymph node measures 2.4 cm and is located lateral to the pectoralis minor consistent with level I adenopathy.  There is a 1.6 cm lymph node behind the pectoralis minor consistent with enlarged level II adenopathy.  IMPRESSION:  1.  2.2 cm mass in the posterior third of the upper outer quadrant of the right breast corresponding with the known invasive mammary carcinoma. 2.  Patchy non mass-like enhancement in the anterior third of the upper outer quadrant of the right breast.  MR guided core biopsy is recommended. 3. Enlarged level I and II right axillary adenopathy.  RECOMMENDATION: MR guided core biopsy of the right breast.  THREE-DIMENSIONAL MR IMAGE RENDERING ON INDEPENDENT WORKSTATION:  Three-dimensional MR images were rendered by post-processing of the original MR data on an independent workstation.  The three- dimensional MR images were interpreted, and findings were reported in the accompanying complete MRI report for this study.  BI-RADS CATEGORY 4:  Suspicious abnormality - biopsy should be considered.   Original Report Authenticated By: Baird Lyons, M.D.    Mr Biopsy/wire Localization  03/20/2012  *RADIOLOGY REPORT*  Clinical Data:  Known right breast carcinoma.  Additional area of worrisome enhancement located within the anterior upper outer quadrant of the right breast.  MRI GUIDED VACUUM ASSISTED BIOPSY OF THE RIGHT BREAST  WITHOUT AND WITH CONTRAST  Comparison: Previous exams.  Technique: Multiplanar, multisequence MR images of the right breast were obtained prior to and following the intravenous administration of 12 ml of Mulithance.  I met with the patient, and we discussed the procedure of MRI guided biopsy, including risks, benefits, and alternatives. Specifically, we discussed the risks of infection, bleeding, tissue injury, clip migration, and inadequate sampling.  Informed, written consent was given.  Using sterile technique, 2% Lidocaine, MRI guidance, and a 9 gauge vacuum assisted device, biopsy was performed of the area of enhancement located within the anterior portion of the upper-outer quadrant - right breast using a lateral approach.  At the conclusion of the procedure, a  tissue marker clip was deployed into the biopsy cavity.  IMPRESSION: MRI guided biopsy of the area of enhancement located within the anterior portion of the upper-outer quadrant of the right breast as discussed above. No apparent complications.  BI-RADS CATEGORY 6:  Known biopsy-proven malignancy - appropriate action should be taken.  THREE-DIMENSIONAL MR IMAGE RENDERING ON INDEPENDENT WORKSTATION:  Three-dimensional MR images were rendered by post-processing of the original MR data on an independent workstation.  The three- dimensional MR images were interpreted, and findings were reported in the accompanying complete MRI report for this study.  The pathology associated with the right breast MR guided core biopsy demonstrated invasive mammary carcinoma and in situ mammary carcinoma.  This is concordant with imaging findings.  I have discussed findings with the patient by telephone and answered her questions.  The patient states that biopsy site is clean and dry without hematoma formation or signs of infection.  Post biopsy wound care instructions were reviewed with the patient.  Follow-up will be with Dr. Carolynne Edouard and Dr. Welton Flakes regards her treatment plan.   The patient was encouraged to call the Breast Center for additional questions or concerns   Original Report Authenticated By: Rolla Plate, M.D.    Nm Pet Image Initial (pi) Skull Base To Thigh  03/22/2012  *  RADIOLOGY REPORT*  Clinical Data: Initial treatment strategy for high risk right breast cancer.  NUCLEAR MEDICINE PET SKULL BASE TO THIGH  Fasting Blood Glucose:  100  Technique:  18.6 mCi F-18 FDG was injected intravenously. CT data was obtained and used for attenuation correction and anatomic localization only.  (This was not acquired as a diagnostic CT examination.) Additional exam technical data entered on technologist worksheet.  Comparison:  MRI breast dated 03/12/2012  Findings:  Neck: No hypermetabolic lymph nodes in the neck.  Chest:  Vague hypermetabolism in the upper/central right breast, max SUV 2.7.  Additional focal hypermetabolism in the upper/outer right breast, max SUV 2.3 (PET image 105).  These findings correspond to biopsy-proven primary breast carcinoma.  1.3 cm short axis right subpectoral lymph node (series 2/image 66), max SUV 3.1.  Right axillary lymphadenopathy measuring up to 1.6 cm short axis (series 2/image 88), max SUV 4.8).  No hypermetabolic mediastinal or hilar nodes.  No suspicious pulmonary nodules on the CT scan.  Abdomen/Pelvis:  No abnormal hypermetabolic activity within the liver, pancreas, adrenal glands, or spleen.  No hypermetabolic lymph nodes in the abdomen or pelvis.  Skeleton:  No focal hypermetabolic activity to suggest skeletal metastasis.  IMPRESSION: Two foci of hypermetabolism in the upper right breast, max SUV 2.7, corresponding to biopsy-proven primary breast carcinoma.  Associated right subpectoral and axillary nodal metastases, max SUV 4.8.  No evidence of distant metastases.   Original Report Authenticated By: Charline Bills, M.D.    Mm Digital Diagnostic Unilat R  03/20/2012  *RADIOLOGY REPORT*  Clinical Data:  Post right breast MR guided core  biopsy.  DIGITAL DIAGNOSTIC RIGHT BREAST MAMMOGRAM  Comparison:  03/05/2012, 03/01/2012, 03/01/2011, 02/26/2010 mammograms from [facility].  Findings:  Films are performed following MR guided biopsy of the anterior portion of the upper-outer quadrant of the right breast. The hourglass shaped clip placed during the MR guided core biopsy appears in appropriate position.  IMPRESSION: Appropriate positioning of clip following right breast MR guided core biopsy.   Original Report Authenticated By: Rolla Plate, M.D.     ASSESSMENT: 67 year old [place], [place] woman  #1  status post mastectomy with right axillary lymph node dissection 05/10/2012 for an mpT2 N1, stage stage IIB invasive lobular breast cancer, stage IIB, ER positive, HER-2/neu positive, with an Mib-1 between 5% and 44%  #2 receiving adjuvant chemotherapy consisting of Taxotere, carboplatinum and Herceptin given every 21 days for total of 6 cycles of Taxotere carboplatinum and weekly Herceptin beginning on 06/14/12.  #3 Herceptin to be continued to total one year; most recent echo 03/28/2012  PLAN:   1. Ms. Rode is doing well.  She will proceed with Herceptin only.  Her white count has recovered.  She completed the Cipro and is no longer neutropenic.    2. She will continue to take Super B complex daily. The numbness is minimal.    3. Ms. Schwartzkopf will return next week for labs, appt, chemotherapy.    All questions were answered. The patient knows to call the clinic with any problems, questions or concerns. We can certainly see the patient much sooner if necessary.   I spent 25 minutes counseling the patient face to face. The total time spent in the appointment was 30 minutes.  Cherie Ouch Lyn Hollingshead, NP Medical Oncology [facility] Phone: [phone] 08/30/2012, 12:11 PM

## 2012-09-05 ENCOUNTER — Other Ambulatory Visit: Payer: Self-pay | Admitting: Medical Oncology

## 2012-09-05 DIAGNOSIS — C50411 Malignant neoplasm of upper-outer quadrant of right female breast: Secondary | ICD-10-CM

## 2012-09-06 ENCOUNTER — Ambulatory Visit (HOSPITAL_BASED_OUTPATIENT_CLINIC_OR_DEPARTMENT_OTHER): Payer: Medicare Other | Admitting: Adult Health

## 2012-09-06 ENCOUNTER — Ambulatory Visit: Payer: Medicare Other | Admitting: Radiation Oncology

## 2012-09-06 ENCOUNTER — Encounter: Payer: Self-pay | Admitting: Adult Health

## 2012-09-06 ENCOUNTER — Telehealth: Payer: Self-pay | Admitting: *Deleted

## 2012-09-06 ENCOUNTER — Ambulatory Visit (HOSPITAL_BASED_OUTPATIENT_CLINIC_OR_DEPARTMENT_OTHER): Payer: Medicare Other

## 2012-09-06 ENCOUNTER — Other Ambulatory Visit (HOSPITAL_BASED_OUTPATIENT_CLINIC_OR_DEPARTMENT_OTHER): Payer: Medicare Other | Admitting: Lab

## 2012-09-06 VITALS — BP 159/84 | HR 97 | Temp 97.9°F | Resp 20 | Ht 64.0 in | Wt 143.0 lb

## 2012-09-06 DIAGNOSIS — C50411 Malignant neoplasm of upper-outer quadrant of right female breast: Secondary | ICD-10-CM

## 2012-09-06 DIAGNOSIS — R209 Unspecified disturbances of skin sensation: Secondary | ICD-10-CM

## 2012-09-06 DIAGNOSIS — Z5111 Encounter for antineoplastic chemotherapy: Secondary | ICD-10-CM

## 2012-09-06 DIAGNOSIS — C50419 Malignant neoplasm of upper-outer quadrant of unspecified female breast: Secondary | ICD-10-CM

## 2012-09-06 DIAGNOSIS — Z170 Estrogen receptor positive status [ER+]: Secondary | ICD-10-CM

## 2012-09-06 DIAGNOSIS — W57XXXA Bitten or stung by nonvenomous insect and other nonvenomous arthropods, initial encounter: Secondary | ICD-10-CM

## 2012-09-06 DIAGNOSIS — Z5112 Encounter for antineoplastic immunotherapy: Secondary | ICD-10-CM

## 2012-09-06 LAB — CBC WITH DIFFERENTIAL/PLATELET
BASO%: 0.1 % (ref 0.0–2.0)
Basophils Absolute: 0 10*3/uL (ref 0.0–0.1)
EOS%: 0 % (ref 0.0–7.0)
Eosinophils Absolute: 0 10*3/uL (ref 0.0–0.5)
HCT: 30.7 % — ABNORMAL LOW (ref 34.8–46.6)
HGB: 10.1 g/dL — ABNORMAL LOW (ref 11.6–15.9)
LYMPH%: 9.5 % — ABNORMAL LOW (ref 14.0–49.7)
MCH: 31.4 pg (ref 25.1–34.0)
MCHC: 32.9 g/dL (ref 31.5–36.0)
MCV: 95.3 fL (ref 79.5–101.0)
MONO#: 0.4 10*3/uL (ref 0.1–0.9)
MONO%: 5.4 % (ref 0.0–14.0)
NEUT#: 6.5 10*3/uL (ref 1.5–6.5)
NEUT%: 85 % — ABNORMAL HIGH (ref 38.4–76.8)
Platelets: 152 10*3/uL (ref 145–400)
RBC: 3.22 10*6/uL — ABNORMAL LOW (ref 3.70–5.45)
RDW: 21.5 % — ABNORMAL HIGH (ref 11.2–14.5)
WBC: 7.6 10*3/uL (ref 3.9–10.3)
lymph#: 0.7 10*3/uL — ABNORMAL LOW (ref 0.9–3.3)

## 2012-09-06 LAB — COMPREHENSIVE METABOLIC PANEL (CC13)
ALT: 25 U/L (ref 0–55)
AST: 22 U/L (ref 5–34)
Albumin: 3.9 g/dL (ref 3.5–5.0)
Alkaline Phosphatase: 84 U/L (ref 40–150)
BUN: 16.2 mg/dL (ref 7.0–26.0)
CO2: 24 mEq/L (ref 22–29)
Calcium: 9.8 mg/dL (ref 8.4–10.4)
Chloride: 98 mEq/L (ref 98–107)
Creatinine: 0.9 mg/dL (ref 0.6–1.1)
Glucose: 131 mg/dl — ABNORMAL HIGH (ref 70–99)
Potassium: 3.9 mEq/L (ref 3.5–5.1)
Sodium: 133 mEq/L — ABNORMAL LOW (ref 136–145)
Total Bilirubin: 0.55 mg/dL (ref 0.20–1.20)
Total Protein: 7.2 g/dL (ref 6.4–8.3)

## 2012-09-06 MED ORDER — ACETAMINOPHEN 325 MG PO TABS
650.0000 mg | ORAL_TABLET | Freq: Once | ORAL | Status: AC
  Administered 2012-09-06: 650 mg via ORAL

## 2012-09-06 MED ORDER — DOXYCYCLINE HYCLATE 100 MG PO TABS: 100.0000 mg | ORAL_TABLET | Freq: Two times a day (BID) | ORAL | Status: DC

## 2012-09-06 MED ORDER — SODIUM CHLORIDE 0.9 % IV SOLN
Freq: Once | INTRAVENOUS | Status: AC
  Administered 2012-09-06: 12:00:00 via INTRAVENOUS

## 2012-09-06 MED ORDER — LORAZEPAM 2 MG/ML IJ SOLN
0.5000 mg | Freq: Once | INTRAMUSCULAR | Status: AC
  Administered 2012-09-06: 0.5 mg via INTRAVENOUS

## 2012-09-06 MED ORDER — SODIUM CHLORIDE 0.9 % IJ SOLN
10.0000 mL | INTRAMUSCULAR | Status: DC | PRN
  Administered 2012-09-06: 10 mL
  Filled 2012-09-06: qty 10

## 2012-09-06 MED ORDER — DIPHENHYDRAMINE HCL 25 MG PO CAPS
50.0000 mg | ORAL_CAPSULE | Freq: Once | ORAL | Status: AC
  Administered 2012-09-06: 25 mg via ORAL

## 2012-09-06 MED ORDER — SODIUM CHLORIDE 0.9 % IV SOLN
500.0000 mg | Freq: Once | INTRAVENOUS | Status: AC
  Administered 2012-09-06: 500 mg via INTRAVENOUS
  Filled 2012-09-06: qty 50

## 2012-09-06 MED ORDER — TRASTUZUMAB CHEMO INJECTION 440 MG
2.0000 mg/kg | Freq: Once | INTRAVENOUS | Status: AC
  Administered 2012-09-06: 126 mg via INTRAVENOUS
  Filled 2012-09-06: qty 6

## 2012-09-06 MED ORDER — HEPARIN SOD (PORK) LOCK FLUSH 100 UNIT/ML IV SOLN
500.0000 [IU] | Freq: Once | INTRAVENOUS | Status: AC | PRN
  Administered 2012-09-06: 500 [IU]
  Filled 2012-09-06: qty 5

## 2012-09-06 MED ORDER — ONDANSETRON 16 MG/50ML IVPB (CHCC)
16.0000 mg | Freq: Once | INTRAVENOUS | Status: AC
  Administered 2012-09-06: 16 mg via INTRAVENOUS

## 2012-09-06 MED ORDER — DEXAMETHASONE SODIUM PHOSPHATE 20 MG/5ML IJ SOLN
20.0000 mg | Freq: Once | INTRAMUSCULAR | Status: AC
  Administered 2012-09-06: 20 mg via INTRAVENOUS
  Filled 2012-09-06: qty 5

## 2012-09-06 MED ORDER — SODIUM CHLORIDE 0.9 % IV SOLN
75.0000 mg/m2 | Freq: Once | INTRAVENOUS | Status: AC
  Administered 2012-09-06: 120 mg via INTRAVENOUS
  Filled 2012-09-06: qty 12

## 2012-09-06 NOTE — Patient Instructions (Signed)
[facility] Discharge Instructions for Patients Receiving Chemotherapy  Today you received the following chemotherapy agents Taxotere, Carboplatin and Herceptin.  To help prevent nausea and vomiting after your treatment, we encourage you to take your nausea medication.   If you develop nausea and vomiting that is not controlled by your nausea medication, call the clinic. If it is after clinic hours your family physician or the after hours number for the clinic or go to the Emergency Department.   BELOW ARE SYMPTOMS THAT SHOULD BE REPORTED IMMEDIATELY:  *FEVER GREATER THAN 100.5 F  *CHILLS WITH OR WITHOUT FEVER  NAUSEA AND VOMITING THAT IS NOT CONTROLLED WITH YOUR NAUSEA MEDICATION  *UNUSUAL SHORTNESS OF BREATH  *UNUSUAL BRUISING OR BLEEDING  TENDERNESS IN MOUTH AND THROAT WITH OR WITHOUT PRESENCE OF ULCERS  *URINARY PROBLEMS  *BOWEL PROBLEMS  UNUSUAL RASH Items with * indicate a potential emergency and should be followed up as soon as possible.  One of the nurses will contact you 24 hours after your treatment. Please let the nurse know about any problems that you may have experienced. Feel free to call the clinic you have any questions or concerns. The clinic phone number is [phone].   I have been informed and understand all the instructions given to me. I know to contact the clinic, my physician, or go to the Emergency Department if any problems should occur. I do not have any questions at this time, but understand that I may call the clinic during office hours   should I have any questions or need assistance in obtaining follow up care.    __________________________________________  _____________  __________ Signature of Patient or Authorized Representative            Date                   Time    __________________________________________ Nurse's Signature    

## 2012-09-06 NOTE — Patient Instructions (Addendum)
Doing well.  Proceed with chemotherapy.  Please call us if you have any questions or concerns.    Doxycycline tablets or capsules What is this medicine? DOXYCYCLINE (dox i SYE kleen) is a tetracycline antibiotic. It kills certain bacteria or stops their growth. It is used to treat many kinds of infections, like dental, skin, respiratory, and urinary tract infections. It also treats acne, Lyme disease, malaria, and certain sexually transmitted infections. This medicine may be used for other purposes; ask your health care provider or pharmacist if you have questions. What should I tell my health care provider before I take this medicine? They need to know if you have any of these conditions: -liver disease -long exposure to sunlight like working outdoors -stomach problems like colitis -an unusual or allergic reaction to doxycycline, tetracycline antibiotics, other medicines, foods, dyes, or preservatives -pregnant or trying to get pregnant -breast-feeding How should I use this medicine? Take this medicine by mouth with a full glass of water. Follow the directions on the prescription label. It is best to take this medicine without food, but if it upsets your stomach take it with food. Take your medicine at regular intervals. Do not take your medicine more often than directed. Take all of your medicine as directed even if you think you are better. Do not skip doses or stop your medicine early. Talk to your pediatrician regarding the use of this medicine in children. Special care may be needed. While this drug may be prescribed for children as young as 5 years old for selected conditions, precautions do apply. Overdosage: If you think you have taken too much of this medicine contact a poison control center or emergency room at once. NOTE: This medicine is only for you. Do not share this medicine with others. What if I miss a dose? If you miss a dose, take it as soon as you can. If it is almost time  for your next dose, take only that dose. Do not take double or extra doses. What may interact with this medicine? -antacids -barbiturates -birth control pills -bismuth subsalicylate -carbamazepine -methoxyflurane -other antibiotics -phenytoin -vitamins that contain iron -warfarin This list may not describe all possible interactions. Give your health care provider a list of all the medicines, herbs, non-prescription drugs, or dietary supplements you use. Also tell them if you smoke, drink alcohol, or use illegal drugs. Some items may interact with your medicine. What should I watch for while using this medicine? Tell your doctor or health care professional if your symptoms do not improve. Do not treat diarrhea with over the counter products. Contact your doctor if you have diarrhea that lasts more than 2 days or if it is severe and watery. Do not take this medicine just before going to bed. It may not dissolve properly when you lay down and can cause pain in your throat. Drink plenty of fluids while taking this medicine to also help reduce irritation in your throat. This medicine can make you more sensitive to the sun. Keep out of the sun. If you cannot avoid being in the sun, wear protective clothing and use sunscreen. Do not use sun lamps or tanning beds/booths. Birth control pills may not work properly while you are taking this medicine. Talk to your doctor about using an extra method of birth control. If you are being treated for a sexually transmitted infection, avoid sexual contact until you have finished your treatment. Your sexual partner may also need treatment. Avoid antacids, aluminum, calcium, magnesium,  and iron products for 4 hours before and 2 hours after taking a dose of this medicine. If you are using this medicine to prevent malaria, you should still protect yourself from contact with mosquitos. Stay in screened-in areas, use mosquito nets, keep your body covered, and use an  insect repellent. What side effects may I notice from receiving this medicine? Side effects that you should report to your doctor or health care professional as soon as possible: -allergic reactions like skin rash, itching or hives, swelling of the face, lips, or tongue -difficulty breathing -fever -itching in the rectal or genital area -pain on swallowing -redness, blistering, peeling or loosening of the skin, including inside the mouth -severe stomach pain or cramps -unusual bleeding or bruising -unusually weak or tired -yellowing of the eyes or skin Side effects that usually do not require medical attention (report to your doctor or health care professional if they continue or are bothersome): -diarrhea -loss of appetite -nausea, vomiting This list may not describe all possible side effects. Call your doctor for medical advice about side effects. You may report side effects to FDA at 1-800-FDA-1088. Where should I keep my medicine? Keep out of the reach of children. Store at room temperature, below 30 degrees C (86 degrees F). Protect from light. Keep container tightly closed. Throw away any unused medicine after the expiration date. Taking this medicine after the expiration date can make you seriously ill. NOTE: This sheet is a summary. It may not cover all possible information. If you have questions about this medicine, talk to your doctor, pharmacist, or health care provider.  2012, [employer]. (08/14/2007 4:53:02 PM)

## 2012-09-06 NOTE — Telephone Encounter (Signed)
appts made and printed...td 

## 2012-09-06 NOTE — Progress Notes (Signed)
OFFICE PROGRESS NOTE   [name], MD [address] [place] [address] Dr. Chevis Pretty Dr. Chipper Herb  DIAGNOSIS: 67 year old female with invasive lobular breast cancer, stage IIB, receiving adjuvant chemotherapy  PRIOR THERAPY:  #1 patient was originally seen in the multidisciplinary breast clinic on 03/14/2012 for new diagnosis of invasive ductal carcinoma that was HER-2 positive node-positive, ER positive PR negative with Ki-67 of 44%  #2 on MRI patient also had an anterior breast mass that was to be biopsied to decide whether or not she should receive neoadjuvant chemotherapy or adjuvant chemotherapy. Patient's mass was biopsied and it was positive for a malignancy that was HER-2/neu negative.  #3 patient is now status post right mastectomy with axillary lymph node dissection with the final pathology revealing the largest tumor measuring 5 cm, 1.7 cm and 1.6 cm. Tumor was ER positive PR negative HER-2/neu positive with a Ki-67 of 44% 3 of 7 lymph nodes were positive for metastatic disease.  #4 patient will proceed with adjuvant chemotherapy and Herceptin. Chemotherapy will consist of Taxotere carboplatinum given every 21 days with weekly Herceptin. Total of 6 cycles of Taxotere carboplatin will be administered starting on 06/14/2012. Once patient completes her chemotherapy then she will go on to receive radiation therapy along with Herceptin with the Herceptin then being converted to every 3 weeks to finish a total of one year. Because patient's tumor is ER positive she will also be given antiestrogen therapy adjuvantly.  CURRENT THERAPY:Cycle 5 day 1 of TCH  INTERVAL HISTORY: Ms. Axelson is here for f/u prior to her adjuvant treatment of her right breast cancer.  She is upset this morning due to a tick bite on her right lateral leg.  She noticed the tick at 930 this morning.  She had planned on waiting to go to the dermatologist tomorrow for removal. She denies, fevers,  chills, nausea, vomiting, diarrhea, constipation.  She continues to have mild numbness in her bilateral thumbs and dry skin on her face that she uses moisturizer for.  Otherwise, a 10 point ROS is neg.   MEDICAL HISTORY: Past Medical History  Diagnosis Date  . Chronic kidney disease   . Hx: UTI (urinary tract infection)   . Right shoulder pain   . Right knee pain   . Hypertension     recently started Aldactone   . Hyperlipidemia     but not on meds;diet and exercise controlled  . Dizziness     has been going on for 64months;medical MD aware  . H/O hiatal hernia   . GERD (gastroesophageal reflux disease)     Tums prn  . Hemorrhoids   . Urinary frequency     d/t taking Aldactone  . History of UTI   . Anemia     hx of  . Cataract     immature-not sure of which eye  . Anxiety     r/t updated surgery  . Insomnia     takes Melatoniin daily  . Complication of anesthesia     pt states she is sensitive to meds  . Breast cancer     right  . Skin cancer     ALLERGIES:  is allergic to codeine; keflex; naprosyn; and tussin.  MEDICATIONS:  Current Outpatient Prescriptions  Medication Sig Dispense Refill  . B Complex-C (SUPER B COMPLEX PO) Take 1 each by mouth daily.      . calcium carbonate (TUMS - DOSED IN MG ELEMENTAL CALCIUM) 500 MG chewable tablet  Chew 1 tablet by mouth daily.      . Cholecalciferol (VITAMIN D-3 PO) Take 4,000 Units by mouth daily.      . ciprofloxacin (CIPRO) 500 MG tablet       . clindamycin (CLINDAGEL) 1 % gel Apply topically 2 (two) times daily.  30 g  0  . dexamethasone (DECADRON) 4 MG tablet Take 2 tablets (8 mg total) by mouth 2 (two) times daily with a meal. Take two times a day the day before Taxotere. Then take two times a day starting the day after chemo for 3 days.  36 tablet  5  . lidocaine-prilocaine (EMLA) cream Apply topically as needed.  30 g  7  . Loratadine (CLARITIN PO) Take 1 each by mouth daily.      Marland Kitchen LORazepam (ATIVAN) 0.5 MG tablet Take  1 tablet (0.5 mg total) by mouth every 6 (six) hours as needed (Nausea or vomiting).  30 tablet  0  . magnesium gluconate (MAGONATE) 500 MG tablet Take 500 mg by mouth 2 (two) times daily.      Marland Kitchen omeprazole (PRILOSEC) 40 MG capsule Take 1 capsule (40 mg total) by mouth daily.  30 capsule  6  . ondansetron (ZOFRAN) 8 MG tablet Take 1 tablet (8 mg total) by mouth 2 (two) times daily. Take two times a day starting the day after chemo for 3 days. Then take two times a day as needed for nausea or vomiting.  30 tablet  1  . PREVIDENT 5000 BOOSTER PLUS 1.1 % PSTE Take 1 application by mouth Twice daily.      . Probiotic Product (PROBIOTIC DAILY PO) Take 1 each by mouth.      . prochlorperazine (COMPAZINE) 10 MG tablet Take 1 tablet (10 mg total) by mouth every 6 (six) hours as needed (Nausea or vomiting).  30 tablet  1  . prochlorperazine (COMPAZINE) 25 MG suppository Place 1 suppository (25 mg total) rectally every 12 (twelve) hours as needed for nausea.  12 suppository  3  . spironolactone (ALDACTONE) 25 MG tablet Take 0.5 tablets (12.5 mg total) by mouth daily.  30 tablet  3   No current facility-administered medications for this visit.    SURGICAL HISTORY:  Past Surgical History  Procedure Laterality Date  . Cyst removed from left breast  1970  . Left wrist surgery   2004    with plate  . Colonoscopy    . Esophagogastroduodenoscopy    . Mastectomy, radical  05/11/2011    right   . Mastectomy modified radical  05/10/2012    Procedure: MASTECTOMY MODIFIED RADICAL;  Surgeon: Robyne Askew, MD;  Location: [facility] OR;  Service: General;  Laterality: Right;  RIGHT MODIFIED RADICAL MASTECTOMY  . Portacath placement  05/10/2012    Procedure: INSERTION PORT-A-CATH;  Surgeon: Robyne Askew, MD;  Location: [facility] OR;  Service: General;  Laterality: Left;  . Wrist fracture surgery  2004    REVIEW OF SYSTEMS:  General: fatigue (+), night sweats (-), fever (-), pain (-) Lymph: palpable nodes (-) HEENT: vision  changes (-), mucositis (-), gum bleeding (-), epistaxis (-) Cardiovascular: chest pain (-), palpitations (-) Pulmonary: shortness of breath (-), dyspnea on exertion (-), cough (-), hemoptysis (-) GI:  Early satiety (-), melena (-), dysphagia (-), nausea/vomiting (-), diarrhea (-) GU: dysuria (-), hematuria (-), incontinence (-) Musculoskeletal: joint swelling (-), joint pain (-), back pain (-) Neuro: weakness (-), numbness (+), headache (-), confusion (-) Skin: Rash (-),  lesions (-), dryness (-) Psych: depression (-), suicidal/homicidal ideation (-), feeling of hopelessness (-)    PHYSICAL EXAMINATION: Blood pressure 159/84, pulse 97, temperature 97.9 F (36.6 C), temperature source Oral, resp. rate 20, height 5\' 4"  (1.626 m), weight 143 lb (64.864 kg). Body mass index is 24.53 kg/(m^2). General: Patient is a well appearing female in no acute distress HEENT: PERRLA, sclerae anicteric no conjunctival pallor, MMM Neck: supple, no palpable adenopathy Lungs: clear to auscultation bilaterally, no wheezes, rhonchi, or rales Cardiovascular: regular rate rhythm, S1, S2, no murmurs, rubs or gallops Abdomen: Soft, non-tender, non-distended, normoactive bowel sounds, no HSM Extremities: warm and well perfused, no clubbing, cyanosis, or edema Skin: No rashes or lesions, pustular rash on scalp Neuro: Non-focal Breasts: The right breast is status post mastectomy. There is no evidence of local recurrence. The right axilla is benign. The left breast is unremarkable. ECOG PERFORMANCE STATUS: 1   LABORATORY DATA: Lab Results  Component Value Date   WBC 7.6 09/06/2012   HGB 10.1* 09/06/2012   HCT 30.7* 09/06/2012   MCV 95.3 09/06/2012   PLT 152 09/06/2012      Chemistry      Component Value Date/Time   NA 135* 08/30/2012 1108   NA 138 05/11/2012 0640   K 3.7 08/30/2012 1108   K 4.7 05/11/2012 0640   CL 101 08/30/2012 1108   CL 99 05/11/2012 0640   CO2 25 08/30/2012 1108   CO2 20 05/11/2012 0640   BUN 11.1  08/30/2012 1108   BUN 12 05/11/2012 0640   CREATININE 1.0 08/30/2012 1108   CREATININE 0.88 05/11/2012 0640      Component Value Date/Time   CALCIUM 8.8 08/30/2012 1108   CALCIUM 9.7 05/11/2012 0640   ALKPHOS 92 08/30/2012 1108   AST 24 08/30/2012 1108   ALT 30 08/30/2012 1108   BILITOT 0.38 08/30/2012 1108     ADDITIONAL INFORMATION: CHROMOGENIC IN-SITU HYBRIDIZATION (1C - TUMOR WITH CLIP) Interpretation HER-2/NEU BY CISH - NO AMPLIFICATION OF HER-2 DETECTED. THE RATIO OF HER-2: CEP 17 SIGNALS WAS 1.25. Reference range: Ratio: HER2:CEP17 < 1.8 - gene amplification not observed Ratio: HER2:CEP 17 1.8-2.2 - equivocal result Ratio: HER2:CEP17 > 2.2 - gene amplification observed CHROMOGENIC IN-SITU HYBRIDIZATION (1E - TUMOR WITHOUT CLIP) Interpretation: HER2/NEU BY CISH - SHOWS AMPLIFICATION BY CISH ANALYSIS. THE RATIO OF HER2: CEP 17 SIGNALS WAS 2.83. Reference range: Ratio: HER2:CEP17 < 1.8 gene amplification not observed Ratio: HER2:CEP 17 1.8-2.2 - equivocal result Ratio: HER2:CEP17 > 2.2 - gene amplification observed Pecola Leisure MD Pathologist, Electronic Signature ( Signed 05/15/2012) FINAL DIAGNOSIS Diagnosis Breast, modified radical mastectomy , right, with axillary contents 1 of 4 FINAL for TALAYLA, DOYEL S (SZA14-4) Diagnosis(continued) TUMOR #1 (WITH CLIP): - INVASIVE LOBULAR CARCINOMA, GRADE II, SEE COMMENT. - LYMPHOVASCULAR INVASION IDENTIFIED. - PREVIOUS BIOPSY SITE IDENTIFIED. TUMOR #2 (NO CLIP): - INVASIVE LOBULAR CARCINOMA, GRADE II, SEE COMMENT. - LOBULAR CARCINOMA IN SITU. - LYMPHOVASCULAR INVASION IDENTIFIED. - THREE LYMPH NODES, POSITIVE FOR METASTATIC MAMMARY CARCINOMA (3/7). - TUMOR DEPOSITS ARE 1.7, 1.6, AND 5 CM. - EXTRACAPSULAR TUMOR EXTENSION IDENTIFIED. - TWO LYMPH NODES, POSITIVE FOR ISOLATED TUMOR CELLS. Microscopic Comment BREAST, INVASIVE TUMOR, WITH LYMPH NODE SAMPLING Specimen, including laterality: Right breast Procedure: Modified radical  mastectomy Grade: Tumor 1 and tumor 2: Grade II of III Tubule formation: 3 Nuclear pleomorphism: 2 Mitotic: 1 Tumor size (gross measurement: Tumor #1- 2.3 cm; tumor #2 - 2.4 cm Margins: Invasive, distance to closest margin: Tumor #1 2.7 cm - deep  margin; tumor #2 - 1.6 cm - deep margin Lymphovascular invasion: Present Ductal carcinoma in situ: Absent Grade: N/A Extensive intraductal component: N/A Lobular neoplasia: Both atypical hyperplasia and in situ carcinoma Extent of tumor: Skin: Grossly negative Nipple: Grossly negative Skeletal muscle: Microscopically negative Lymph nodes: # examined: 7 Lymph nodes with metastasis: 3 Isolated tumor cells (< 0.2 mm): two lymph nodes (Slide 1P and 1O) Micrometastasis: (> 0.2 mm and < 2.0 mm): none Macrometastasis: (> 2.0 mm): 1.7 cm , 1.6 cm and 5 cm Extracapsular extension: Present Breast prognostic profile: Tumor #1: Estrogen receptor: Not repeated, previous study demonstrated 98% positivity ([id number]) Progesterone receptor: Not repeated, previous study demonstrated 0% positivity ([id number]) Her 2 neu: Repeated, previous study demonstrated no amplification (1.38) ([id number]) Ki-67: Not repeated, previous study demonstrated 5% proliferation rate ([id number]) Tumor #2: Estrogen receptor: Not repeated, previous study demonstrated 97% positivity ([id number]) Progesterone receptor: Not repeated, previous study demonstrated 0% positivity ([id number]) Her 2 neu: Repeated, previous study demonstrated amplification (3.65) ([id number]) Ki-67: Not repeated, previous study demonstrated 44% proliferation rate ([id number]) 2 of 4 FINAL for SHANIECE, BUSSA (SZA14-4) Microscopic Comment(continued) Non-neoplastic breast: Benign fibrocystic change with usual ductal hyperplasia, sclerosing adenosis, benign radial sclerosing lesion, and microcalcifications in benign ducts and lobules. TNM: mpT2, pN1a, pMX. Comments: none  RADIOGRAPHIC  STUDIES:  Mr Lodema Pilot Contrast  03/23/2012  *RADIOLOGY REPORT*  Clinical Data: New diagnosis breast cancer.  Rule out metastatic disease.  MRI HEAD WITHOUT AND WITH CONTRAST  Technique:  Multiplanar, multiecho pulse sequences of the brain and surrounding structures were obtained according to standard protocol without and with intravenous contrast  Contrast: 12mL MULTIHANCE GADOBENATE DIMEGLUMINE 529 MG/ML IV SOLN  Comparison: None.  Findings: Ventricle size is normal.  Small 4 mm hyperintensities in the cerebral white matter bilaterally do not enhance and are most likely due to chronic microvascular ischemia.  Negative for acute infarct.  Diffusion weighted imaging is normal.  Pituitary is normal in size.  Negative for intracranial hemorrhage.  Following contrast infusion, no enhancing lesions are identified.  Paranasal sinuses are clear.  Vessels at the base of the brain are patent.  IMPRESSION: Negative for metastatic disease.  No acute abnormality.   Original Report Authenticated By: Janeece Riggers, M.D.    Mr Breast Bilateral W Wo Contrast  03/12/2012  *RADIOLOGY REPORT*  Clinical Data: Recently diagnosed invasive mammary carcinoma in the 10 o'clock region of the right breast with a metastatic right axillary lymph node.  BILATERAL BREAST MRI WITH AND WITHOUT CONTRAST  Technique: Multiplanar, multisequence MR images of both breasts were obtained prior to and following the intravenous administration of 12ml of multihance.  Three dimensional images were evaluated at the independent DynaCad workstation.  Comparison:  Mammograms dated 03/05/2012, 03/01/2012, 03/01/2011 and 02/26/2010 from [facility].  Findings: There is a moderate background parenchymal enhancement pattern.  Right breast: 1.  There is a lobulated 2.2 x 1.7 x 1.8 cm mass in the posterior third of the upper outer quadrant of the right breast.  There is a post biopsy clip artifact 1 cm lateral to the mass. 2.  In the anterior third upper  outer quadrant of the right breast there is patchy non mass like enhancement measuring 0.8 x 0.3 x 0.3 cm.  Left breast:  There is no abnormal enhancement the left breast.  There are enlarged right axillary lymph nodes.  The largest right axillary lymph node measures 2.4 cm and is located lateral to the pectoralis minor consistent with level I  adenopathy.  There is a 1.6 cm lymph node behind the pectoralis minor consistent with enlarged level II adenopathy.  IMPRESSION:  1.  2.2 cm mass in the posterior third of the upper outer quadrant of the right breast corresponding with the known invasive mammary carcinoma. 2.  Patchy non mass-like enhancement in the anterior third of the upper outer quadrant of the right breast.  MR guided core biopsy is recommended. 3. Enlarged level I and II right axillary adenopathy.  RECOMMENDATION: MR guided core biopsy of the right breast.  THREE-DIMENSIONAL MR IMAGE RENDERING ON INDEPENDENT WORKSTATION:  Three-dimensional MR images were rendered by post-processing of the original MR data on an independent workstation.  The three- dimensional MR images were interpreted, and findings were reported in the accompanying complete MRI report for this study.  BI-RADS CATEGORY 4:  Suspicious abnormality - biopsy should be considered.   Original Report Authenticated By: Baird Lyons, M.D.    Mr Biopsy/wire Localization  03/20/2012  *RADIOLOGY REPORT*  Clinical Data:  Known right breast carcinoma.  Additional area of worrisome enhancement located within the anterior upper outer quadrant of the right breast.  MRI GUIDED VACUUM ASSISTED BIOPSY OF THE RIGHT BREAST WITHOUT AND WITH CONTRAST  Comparison: Previous exams.  Technique: Multiplanar, multisequence MR images of the right breast were obtained prior to and following the intravenous administration of 12 ml of Mulithance.  I met with the patient, and we discussed the procedure of MRI guided biopsy, including risks, benefits, and alternatives.  Specifically, we discussed the risks of infection, bleeding, tissue injury, clip migration, and inadequate sampling.  Informed, written consent was given.  Using sterile technique, 2% Lidocaine, MRI guidance, and a 9 gauge vacuum assisted device, biopsy was performed of the area of enhancement located within the anterior portion of the upper-outer quadrant - right breast using a lateral approach.  At the conclusion of the procedure, a  tissue marker clip was deployed into the biopsy cavity.  IMPRESSION: MRI guided biopsy of the area of enhancement located within the anterior portion of the upper-outer quadrant of the right breast as discussed above. No apparent complications.  BI-RADS CATEGORY 6:  Known biopsy-proven malignancy - appropriate action should be taken.  THREE-DIMENSIONAL MR IMAGE RENDERING ON INDEPENDENT WORKSTATION:  Three-dimensional MR images were rendered by post-processing of the original MR data on an independent workstation.  The three- dimensional MR images were interpreted, and findings were reported in the accompanying complete MRI report for this study.  The pathology associated with the right breast MR guided core biopsy demonstrated invasive mammary carcinoma and in situ mammary carcinoma.  This is concordant with imaging findings.  I have discussed findings with the patient by telephone and answered her questions.  The patient states that biopsy site is clean and dry without hematoma formation or signs of infection.  Post biopsy wound care instructions were reviewed with the patient.  Follow-up will be with Dr. Carolynne Edouard and Dr. Welton Flakes regards her treatment plan.  The patient was encouraged to call the Breast Center for additional questions or concerns   Original Report Authenticated By: Rolla Plate, M.D.    Nm Pet Image Initial (pi) Skull Base To Thigh  03/22/2012  *RADIOLOGY REPORT*  Clinical Data: Initial treatment strategy for high risk right breast cancer.  NUCLEAR MEDICINE PET  SKULL BASE TO THIGH  Fasting Blood Glucose:  100  Technique:  18.6 mCi F-18 FDG was injected intravenously. CT data was obtained and used for attenuation correction and anatomic localization  only.  (This was not acquired as a diagnostic CT examination.) Additional exam technical data entered on technologist worksheet.  Comparison:  MRI breast dated 03/12/2012  Findings:  Neck: No hypermetabolic lymph nodes in the neck.  Chest:  Vague hypermetabolism in the upper/central right breast, max SUV 2.7.  Additional focal hypermetabolism in the upper/outer right breast, max SUV 2.3 (PET image 105).  These findings correspond to biopsy-proven primary breast carcinoma.  1.3 cm short axis right subpectoral lymph node (series 2/image 66), max SUV 3.1.  Right axillary lymphadenopathy measuring up to 1.6 cm short axis (series 2/image 88), max SUV 4.8).  No hypermetabolic mediastinal or hilar nodes.  No suspicious pulmonary nodules on the CT scan.  Abdomen/Pelvis:  No abnormal hypermetabolic activity within the liver, pancreas, adrenal glands, or spleen.  No hypermetabolic lymph nodes in the abdomen or pelvis.  Skeleton:  No focal hypermetabolic activity to suggest skeletal metastasis.  IMPRESSION: Two foci of hypermetabolism in the upper right breast, max SUV 2.7, corresponding to biopsy-proven primary breast carcinoma.  Associated right subpectoral and axillary nodal metastases, max SUV 4.8.  No evidence of distant metastases.   Original Report Authenticated By: Charline Bills, M.D.    Mm Digital Diagnostic Unilat R  03/20/2012  *RADIOLOGY REPORT*  Clinical Data:  Post right breast MR guided core biopsy.  DIGITAL DIAGNOSTIC RIGHT BREAST MAMMOGRAM  Comparison:  03/05/2012, 03/01/2012, 03/01/2011, 02/26/2010 mammograms from [facility].  Findings:  Films are performed following MR guided biopsy of the anterior portion of the upper-outer quadrant of the right breast. The hourglass shaped clip placed during the MR guided core  biopsy appears in appropriate position.  IMPRESSION: Appropriate positioning of clip following right breast MR guided core biopsy.   Original Report Authenticated By: Rolla Plate, M.D.     ASSESSMENT: 67 year old [place], [place] woman  #1  status post mastectomy with right axillary lymph node dissection 05/10/2012 for an mpT2 N1, stage stage IIB invasive lobular breast cancer, stage IIB, ER positive, HER-2/neu positive, with an Mib-1 between 5% and 44%  #2 receiving adjuvant chemotherapy consisting of Taxotere, carboplatinum and Herceptin given every 21 days for total of 6 cycles of Taxotere carboplatinum and weekly Herceptin beginning on 06/14/12.  #3 Herceptin to be continued to total one year; most recent echo 03/28/2012  PLAN:   1. Ms. Yaklin is doing well.  She will proceed with chemotherapy today.  We will remove the tick and I will prescribe Doxycycline BID x 10 days.  She will follow up with dermatology tomorrow for them to evaluate the lesion.   I also referred her back to get an appt with Dr. Dayton Scrape regarding radiation therapy.  Her echo and Dr. Gala Romney appt is scheduled 09/17/12.   2. She will continue to take Super B complex daily. The numbness is minimal.    3. Ms. Enerson will return next week for labs, appt, chemotherapy.    All questions were answered. The patient knows to call the clinic with any problems, questions or concerns. We can certainly see the patient much sooner if necessary.   I spent 25 minutes counseling the patient face to face. The total time spent in the appointment was 30 minutes.  Cherie Ouch Lyn Hollingshead, NP Medical Oncology [facility] Phone: [phone] 09/06/2012, 11:13 AM

## 2012-09-07 ENCOUNTER — Ambulatory Visit (HOSPITAL_BASED_OUTPATIENT_CLINIC_OR_DEPARTMENT_OTHER): Payer: Medicare Other

## 2012-09-07 VITALS — BP 138/75 | HR 82 | Temp 98.5°F

## 2012-09-07 DIAGNOSIS — C50419 Malignant neoplasm of upper-outer quadrant of unspecified female breast: Secondary | ICD-10-CM

## 2012-09-07 MED ORDER — PEGFILGRASTIM INJECTION 6 MG/0.6ML
6.0000 mg | Freq: Once | SUBCUTANEOUS | Status: AC
  Administered 2012-09-07: 6 mg via SUBCUTANEOUS
  Filled 2012-09-07: qty 0.6

## 2012-09-12 ENCOUNTER — Other Ambulatory Visit: Payer: Self-pay | Admitting: Medical Oncology

## 2012-09-12 ENCOUNTER — Encounter: Payer: Self-pay | Admitting: Oncology

## 2012-09-12 DIAGNOSIS — C50411 Malignant neoplasm of upper-outer quadrant of right female breast: Secondary | ICD-10-CM

## 2012-09-12 NOTE — Progress Notes (Unsigned)
Location of Breast Cancer: right breast  Histology per Pathology Report: invasive lobular carcinoma   Receptor Status: ER(+97%), PR (negative), Her2-neu (positive)  Did patient present with symptoms (if so, please note symptoms) or was this found on screening mammography?: screening mammography  Past/Anticipated interventions by surgeon, if any: right mastectomy with axillary lymph node dissection  Past/Anticipated interventions by medical oncology, if any: chemotherapy consisting of Taxotere carboplatinum given every 21 days with weekly Herceptin.  Total of 6 cycles starting on 06/14/2012.  She will then receive Herceptin every 3 weeks to finish a total of one year.   Lymphedema issues, if any:  ***  Pain issues, if any:  ***  SAFETY ISSUES:  Prior radiation? ***  Pacemaker/ICD? ***  Possible current pregnancy? ***  Is the patient on methotrexate? ***  Current Complaints / other details:  ***

## 2012-09-13 ENCOUNTER — Encounter: Payer: Self-pay | Admitting: Radiation Oncology

## 2012-09-13 ENCOUNTER — Encounter: Payer: Self-pay | Admitting: Adult Health

## 2012-09-13 ENCOUNTER — Ambulatory Visit
Admission: RE | Admit: 2012-09-13 | Discharge: 2012-09-13 | Disposition: A | Discharge status: Home / Self Care | Payer: Medicare Other | Source: Ambulatory Visit | Attending: Radiation Oncology | Admitting: Radiation Oncology

## 2012-09-13 ENCOUNTER — Ambulatory Visit (HOSPITAL_BASED_OUTPATIENT_CLINIC_OR_DEPARTMENT_OTHER): Payer: Medicare Other | Admitting: Adult Health

## 2012-09-13 ENCOUNTER — Ambulatory Visit (HOSPITAL_BASED_OUTPATIENT_CLINIC_OR_DEPARTMENT_OTHER): Payer: Medicare Other

## 2012-09-13 ENCOUNTER — Other Ambulatory Visit (HOSPITAL_BASED_OUTPATIENT_CLINIC_OR_DEPARTMENT_OTHER): Payer: Medicare Other | Admitting: Lab

## 2012-09-13 VITALS — BP 132/76 | HR 97 | Temp 97.6°F | Resp 20 | Ht 64.0 in | Wt 137.6 lb

## 2012-09-13 VITALS — BP 112/63 | HR 90 | Temp 98.2°F

## 2012-09-13 VITALS — BP 128/71 | HR 93 | Temp 98.4°F | Wt 140.0 lb

## 2012-09-13 DIAGNOSIS — C50919 Malignant neoplasm of unspecified site of unspecified female breast: Secondary | ICD-10-CM | POA: Insufficient documentation

## 2012-09-13 DIAGNOSIS — Z170 Estrogen receptor positive status [ER+]: Secondary | ICD-10-CM

## 2012-09-13 DIAGNOSIS — D709 Neutropenia, unspecified: Secondary | ICD-10-CM

## 2012-09-13 DIAGNOSIS — Z5112 Encounter for antineoplastic immunotherapy: Secondary | ICD-10-CM

## 2012-09-13 DIAGNOSIS — C50411 Malignant neoplasm of upper-outer quadrant of right female breast: Secondary | ICD-10-CM

## 2012-09-13 DIAGNOSIS — C50419 Malignant neoplasm of upper-outer quadrant of unspecified female breast: Secondary | ICD-10-CM

## 2012-09-13 LAB — CBC WITH DIFFERENTIAL/PLATELET
BASO%: 0.5 % (ref 0.0–2.0)
Basophils Absolute: 0 10*3/uL (ref 0.0–0.1)
EOS%: 0 % (ref 0.0–7.0)
Eosinophils Absolute: 0 10*3/uL (ref 0.0–0.5)
HCT: 31.4 % — ABNORMAL LOW (ref 34.8–46.6)
HGB: 10.7 g/dL — ABNORMAL LOW (ref 11.6–15.9)
LYMPH%: 50 % — ABNORMAL HIGH (ref 14.0–49.7)
MCH: 32.3 pg (ref 25.1–34.0)
MCHC: 34.1 g/dL (ref 31.5–36.0)
MCV: 94.9 fL (ref 79.5–101.0)
MONO#: 0.7 10*3/uL (ref 0.1–0.9)
MONO%: 32.2 % — ABNORMAL HIGH (ref 0.0–14.0)
NEUT#: 0.4 10*3/uL — CL (ref 1.5–6.5)
NEUT%: 17.3 % — ABNORMAL LOW (ref 38.4–76.8)
Platelets: 105 10*3/uL — ABNORMAL LOW (ref 145–400)
RBC: 3.31 10*6/uL — ABNORMAL LOW (ref 3.70–5.45)
RDW: 17.6 % — ABNORMAL HIGH (ref 11.2–14.5)
WBC: 2 10*3/uL — ABNORMAL LOW (ref 3.9–10.3)
lymph#: 1 10*3/uL (ref 0.9–3.3)
nRBC: 0 % (ref 0–0)

## 2012-09-13 LAB — COMPREHENSIVE METABOLIC PANEL (CC13)
ALT: 19 U/L (ref 0–55)
AST: 17 U/L (ref 5–34)
Albumin: 3.7 g/dL (ref 3.5–5.0)
Alkaline Phosphatase: 87 U/L (ref 40–150)
BUN: 15.6 mg/dL (ref 7.0–26.0)
CO2: 25 mEq/L (ref 22–29)
Calcium: 8.8 mg/dL (ref 8.4–10.4)
Chloride: 96 mEq/L — ABNORMAL LOW (ref 98–107)
Creatinine: 0.8 mg/dL (ref 0.6–1.1)
Glucose: 104 mg/dl — ABNORMAL HIGH (ref 70–99)
Potassium: 3.7 mEq/L (ref 3.5–5.1)
Sodium: 131 mEq/L — ABNORMAL LOW (ref 136–145)
Total Bilirubin: 0.86 mg/dL (ref 0.20–1.20)
Total Protein: 6.6 g/dL (ref 6.4–8.3)

## 2012-09-13 MED ORDER — SODIUM CHLORIDE 0.9 % IJ SOLN
10.0000 mL | INTRAMUSCULAR | Status: DC | PRN
  Administered 2012-09-13: 10 mL
  Filled 2012-09-13: qty 10

## 2012-09-13 MED ORDER — SODIUM CHLORIDE 0.9 % IV SOLN
Freq: Once | INTRAVENOUS | Status: AC
  Administered 2012-09-13: 12:00:00 via INTRAVENOUS

## 2012-09-13 MED ORDER — HEPARIN SOD (PORK) LOCK FLUSH 100 UNIT/ML IV SOLN
500.0000 [IU] | Freq: Once | INTRAVENOUS | Status: AC | PRN
  Administered 2012-09-13: 500 [IU]
  Filled 2012-09-13: qty 5

## 2012-09-13 MED ORDER — TRASTUZUMAB CHEMO INJECTION 440 MG
2.0000 mg/kg | Freq: Once | INTRAVENOUS | Status: AC
  Administered 2012-09-13: 126 mg via INTRAVENOUS
  Filled 2012-09-13: qty 6

## 2012-09-13 MED ORDER — ACETAMINOPHEN 325 MG PO TABS
650.0000 mg | ORAL_TABLET | Freq: Once | ORAL | Status: AC
  Administered 2012-09-13: 650 mg via ORAL

## 2012-09-13 MED ORDER — DIPHENHYDRAMINE HCL 25 MG PO CAPS
50.0000 mg | ORAL_CAPSULE | Freq: Once | ORAL | Status: AC
  Administered 2012-09-13: 50 mg via ORAL

## 2012-09-13 NOTE — Patient Instructions (Addendum)
Trastuzumab injection for infusion What is this medicine? TRASTUZUMAB (tras TOO zoo mab) is a monoclonal antibody. It targets a protein called HER2. This protein is found in some stomach and breast cancers. This medicine can stop cancer cell growth. This medicine may be used with other cancer treatments. This medicine may be used for other purposes; ask your health care provider or pharmacist if you have questions. What should I tell my health care provider before I take this medicine? They need to know if you have any of these conditions: -heart disease -heart failure -infection (especially a virus infection such as chickenpox, cold sores, or herpes) -lung or breathing disease, like asthma -recent or ongoing radiation therapy -an unusual or allergic reaction to trastuzumab, benzyl alcohol, or other medications, foods, dyes, or preservatives -pregnant or trying to get pregnant -breast-feeding How should I use this medicine? This drug is given as an infusion into a vein. It is administered in a hospital or clinic by a specially trained health care professional. Talk to your pediatrician regarding the use of this medicine in children. This medicine is not approved for use in children. Overdosage: If you think you have taken too much of this medicine contact a poison control center or emergency room at once. NOTE: This medicine is only for you. Do not share this medicine with others. What if I miss a dose? It is important not to miss a dose. Call your doctor or health care professional if you are unable to keep an appointment. What may interact with this medicine? -cyclophosphamide -doxorubicin -warfarin This list may not describe all possible interactions. Give your health care provider a list of all the medicines, herbs, non-prescription drugs, or dietary supplements you use. Also tell them if you smoke, drink alcohol, or use illegal drugs. Some items may interact with your medicine. What  should I watch for while using this medicine? Visit your doctor for checks on your progress. Report any side effects. Continue your course of treatment even though you feel ill unless your doctor tells you to stop. Call your doctor or health care professional for advice if you get a fever, chills or sore throat, or other symptoms of a cold or flu. Do not treat yourself. Try to avoid being around people who are sick. You may experience fever, chills and shaking during your first infusion. These effects are usually mild and can be treated with other medicines. Report any side effects during the infusion to your health care professional. Fever and chills usually do not happen with later infusions. What side effects may I notice from receiving this medicine? Side effects that you should report to your doctor or other health care professional as soon as possible: -breathing difficulties -chest pain or palpitations -cough -dizziness or fainting -fever or chills, sore throat -skin rash, itching or hives -swelling of the legs or ankles -unusually weak or tired Side effects that usually do not require medical attention (report to your doctor or other health care professional if they continue or are bothersome): -loss of appetite -headache -muscle aches -nausea This list may not describe all possible side effects. Call your doctor for medical advice about side effects. You may report side effects to FDA at 1-800-FDA-1088. Where should I keep my medicine? This drug is given in a hospital or clinic and will not be stored at home. NOTE: This sheet is a summary. It may not cover all possible information. If you have questions about this medicine, talk to your doctor, pharmacist,   or health care provider.  2013, [employer]. (02/27/2009 1:43:15 PM)  

## 2012-09-13 NOTE — Progress Notes (Signed)
Please see the Nurse Progress Note in the MD Initial Consult Encounter for this patient. 

## 2012-09-13 NOTE — Patient Instructions (Signed)
Doing well.  Proceed with herceptin today.  Please call us if you have any questions or concerns.     Patient Neutropenia Instruction Sheet  Diagnosis: Breast Cancer      Treating Physician: Drue Second, MD  Treatment: 1. Type of chemotherapy: TCH 2. Date of last treatment: 09/06/12  Last Blood Counts: Lab Results  Component Value Date   WBC 2.0* 09/13/2012   HGB 10.7* 09/13/2012   HCT 31.4* 09/13/2012   MCV 94.9 09/13/2012   PLT 105* 09/13/2012  ANC 400     Instructions: 1. Monitor temperature and call if fever  greater than 100.5, chills, shaking chills (rigors) 2. Call Physician on-call at [phone] 3. Give him/her symptoms and list of medications that you are taking and your last blood count.

## 2012-09-13 NOTE — Progress Notes (Signed)
OFFICE PROGRESS NOTE   Heather Bradford, MD [address] [place] [address] Dr. Chevis Pretty Dr. Chipper Herb  DIAGNOSIS: 67 year old female with invasive lobular breast cancer, stage IIB, receiving adjuvant chemotherapy  PRIOR THERAPY:  #1 patient was originally seen in the multidisciplinary breast clinic on 03/14/2012 for new diagnosis of invasive ductal carcinoma that was HER-2 positive node-positive, ER positive PR negative with Ki-67 of 44%  #2 on MRI patient also had an anterior breast mass that was to be biopsied to decide whether or not she should receive neoadjuvant chemotherapy or adjuvant chemotherapy. Patient'[name] mass was biopsied and it was positive for a malignancy that was HER-2/neu negative.  #3 patient is now status post right mastectomy with axillary lymph node dissection with the final pathology revealing the largest tumor measuring 5 cm, 1.7 cm and 1.6 cm. Tumor was ER positive PR negative HER-2/neu positive with a Ki-67 of 44% 3 of 7 lymph nodes were positive for metastatic disease.  #4 patient will proceed with adjuvant chemotherapy and Herceptin. Chemotherapy will consist of Taxotere carboplatinum given every 21 days with weekly Herceptin. Total of 6 cycles of Taxotere carboplatin will be administered starting on 06/14/2012. Once patient completes her chemotherapy then she will go on to receive radiation therapy along with Herceptin with the Herceptin then being converted to every 3 weeks to finish a total of one year. Because patient'[name] tumor is ER positive she will also be given antiestrogen therapy adjuvantly.  CURRENT THERAPY:Cycle 5 day 8 of TCH  INTERVAL HISTORY: Heather [name] is here for f/u prior to her adjuvant treatment of her right breast cancer.  She is feeling moderately well today.  She continues to be on Doxycycline for her recent tick bite, and she feels moderately nauseated due to this.  She has numbness that is now in the tips of her thumb, and first  two digits.  She has no motor changes.  She saw Dr. Dayton Scrape this morning and reviewed her radiation therapy plans.  Her next appointment with Dr. Gala Romney is 09/17/12.    MEDICAL HISTORY: Past Medical History  Diagnosis Date  . Chronic kidney disease   . Hx: UTI (urinary tract infection)   . Right shoulder pain   . Right knee pain   . Hypertension     recently started Aldactone   . Hyperlipidemia     but not on meds;diet and exercise controlled  . Dizziness     has been going on for 32months;medical MD aware  . H/O hiatal hernia   . GERD (gastroesophageal reflux disease)     Tums prn  . Hemorrhoids   . Urinary frequency     d/t taking Aldactone  . History of UTI   . Anemia     hx of  . Cataract     immature-not sure of which eye  . Anxiety     r/t updated surgery  . Insomnia     takes Melatoniin daily  . Complication of anesthesia     pt states she is sensitive to meds  . Breast cancer     right  . Skin cancer     ALLERGIES:  is allergic to codeine; keflex; naprosyn; and tussin.  MEDICATIONS:  Current Outpatient Prescriptions  Medication Sig Dispense Refill  . B Complex-C (SUPER B COMPLEX PO) Take 1 each by mouth daily.      . calcium carbonate (TUMS - DOSED IN MG ELEMENTAL CALCIUM) 500 MG chewable tablet Chew 1 tablet by  mouth daily.      . Cholecalciferol (VITAMIN D-3 PO) Take 4,000 Units by mouth daily.      . ciprofloxacin (CIPRO) 500 MG tablet       . clindamycin (CLINDAGEL) 1 % gel Apply topically 2 (two) times daily.  30 g  0  . dexamethasone (DECADRON) 4 MG tablet Take 2 tablets (8 mg total) by mouth 2 (two) times daily with a meal. Take two times a day the day before Taxotere. Then take two times a day starting the day after chemo for 3 days.  36 tablet  5  . doxycycline (VIBRA-TABS) 100 MG tablet Take 1 tablet (100 mg total) by mouth 2 (two) times daily.  20 tablet  0  . lidocaine-prilocaine (EMLA) cream Apply topically as needed.  30 g  7  . Loratadine  (CLARITIN PO) Take 1 each by mouth daily.      Marland Kitchen LORazepam (ATIVAN) 0.5 MG tablet Take 1 tablet (0.5 mg total) by mouth every 6 (six) hours as needed (Nausea or vomiting).  30 tablet  0  . magnesium gluconate (MAGONATE) 500 MG tablet Take 500 mg by mouth 2 (two) times daily.      Marland Kitchen omeprazole (PRILOSEC) 40 MG capsule Take 1 capsule (40 mg total) by mouth daily.  30 capsule  6  . ondansetron (ZOFRAN) 8 MG tablet Take 1 tablet (8 mg total) by mouth 2 (two) times daily. Take two times a day starting the day after chemo for 3 days. Then take two times a day as needed for nausea or vomiting.  30 tablet  1  . PREVIDENT 5000 BOOSTER PLUS 1.1 % PSTE Take 1 application by mouth Twice daily.      . Probiotic Product (PROBIOTIC DAILY PO) Take 1 each by mouth.      . prochlorperazine (COMPAZINE) 10 MG tablet Take 1 tablet (10 mg total) by mouth every 6 (six) hours as needed (Nausea or vomiting).  30 tablet  1  . prochlorperazine (COMPAZINE) 25 MG suppository Place 1 suppository (25 mg total) rectally every 12 (twelve) hours as needed for nausea.  12 suppository  3  . spironolactone (ALDACTONE) 25 MG tablet Take 0.5 tablets (12.5 mg total) by mouth daily.  30 tablet  3   No current facility-administered medications for this visit.    SURGICAL HISTORY:  Past Surgical History  Procedure Laterality Date  . Cyst removed from left breast  1970  . Left wrist surgery   2004    with plate  . Colonoscopy    . Esophagogastroduodenoscopy    . Mastectomy, radical    . Mastectomy modified radical  05/10/2012    Procedure: MASTECTOMY MODIFIED RADICAL;  Surgeon: Robyne Askew, MD;  Location: [facility] OR;  Service: General;  Laterality: Right;  RIGHT MODIFIED RADICAL MASTECTOMY  . Portacath placement  05/10/2012    Procedure: INSERTION PORT-A-CATH;  Surgeon: Robyne Askew, MD;  Location: [facility] OR;  Service: General;  Laterality: Left;    REVIEW OF SYSTEMS:  General: fatigue (+), night sweats (-), fever (-), pain (-) Lymph:  palpable nodes (-) HEENT: vision changes (-), mucositis (-), gum bleeding (-), epistaxis (-) Cardiovascular: chest pain (-), palpitations (-) Pulmonary: shortness of breath (-), dyspnea on exertion (-), cough (-), hemoptysis (-) GI:  Early satiety (-), melena (-), dysphagia (-), nausea/vomiting (+), diarrhea (-) GU: dysuria (-), hematuria (-), incontinence (-) Musculoskeletal: joint swelling (-), joint pain (-), back pain (-) Neuro: weakness (-), numbness (+),  headache (-), confusion (-) Skin: Rash (-), lesions (-), dryness (-) Psych: depression (-), suicidal/homicidal ideation (-), feeling of hopelessness (-)    PHYSICAL EXAMINATION: Blood pressure 132/76, pulse 97, temperature 97.6 F (36.4 C), temperature source Oral, resp. rate 20, height 5\' 4"  (1.626 m), weight 137 lb 9.6 oz (62.415 kg). Body mass index is 23.61 kg/(m^2). General: Patient is a well appearing female in no acute distress HEENT: PERRLA, sclerae anicteric no conjunctival pallor, MMM Neck: supple, no palpable adenopathy Lungs: clear to auscultation bilaterally, no wheezes, rhonchi, or rales Cardiovascular: regular rate rhythm, S1, S2, no murmurs, rubs or gallops Abdomen: Soft, non-tender, non-distended, normoactive bowel sounds, no HSM Extremities: warm and well perfused, no clubbing, cyanosis, or edema Skin: No rashes or lesions, pustular rash on scalp Neuro: Non-focal Breasts: The right breast is status post mastectomy. There is no evidence of local recurrence. The right axilla is benign. The left breast is unremarkable. ECOG PERFORMANCE STATUS: 1   LABORATORY DATA: Lab Results  Component Value Date   WBC 2.0* 09/13/2012   HGB 10.7* 09/13/2012   HCT 31.4* 09/13/2012   MCV 94.9 09/13/2012   PLT 105* 09/13/2012      Chemistry      Component Value Date/Time   NA 133* 09/06/2012 1014   NA 138 05/11/2012 0640   K 3.9 09/06/2012 1014   K 4.7 05/11/2012 0640   CL 98 09/06/2012 1014   CL 99 05/11/2012 0640   CO2 24 09/06/2012 1014    CO2 20 05/11/2012 0640   BUN 16.2 09/06/2012 1014   BUN 12 05/11/2012 0640   CREATININE 0.9 09/06/2012 1014   CREATININE 0.88 05/11/2012 0640      Component Value Date/Time   CALCIUM 9.8 09/06/2012 1014   CALCIUM 9.7 05/11/2012 0640   ALKPHOS 84 09/06/2012 1014   AST 22 09/06/2012 1014   ALT 25 09/06/2012 1014   BILITOT 0.55 09/06/2012 1014     ADDITIONAL INFORMATION: CHROMOGENIC IN-SITU HYBRIDIZATION (1C - TUMOR WITH CLIP) Interpretation HER-2/NEU BY CISH - NO AMPLIFICATION OF HER-2 DETECTED. THE RATIO OF HER-2: CEP 17 SIGNALS WAS 1.25. Reference range: Ratio: HER2:CEP17 < 1.8 - gene amplification not observed Ratio: HER2:CEP 17 1.8-2.2 - equivocal result Ratio: HER2:CEP17 > 2.2 - gene amplification observed CHROMOGENIC IN-SITU HYBRIDIZATION (1E - TUMOR WITHOUT CLIP) Interpretation: HER2/NEU BY CISH - SHOWS AMPLIFICATION BY CISH ANALYSIS. THE RATIO OF HER2: CEP 17 SIGNALS WAS 2.83. Reference range: Ratio: HER2:CEP17 < 1.8 gene amplification not observed Ratio: HER2:CEP 17 1.8-2.2 - equivocal result Ratio: HER2:CEP17 > 2.2 - gene amplification observed Pecola Leisure MD Pathologist, Electronic Signature ( Signed 05/15/2012) FINAL DIAGNOSIS Diagnosis Breast, modified radical mastectomy , right, with axillary contents 1 of 4 FINAL for Heather [name], Heather [name] ([id number]) Diagnosis(continued) TUMOR #1 (WITH CLIP): - INVASIVE LOBULAR CARCINOMA, GRADE II, SEE COMMENT. - LYMPHOVASCULAR INVASION IDENTIFIED. - PREVIOUS BIOPSY SITE IDENTIFIED. TUMOR #2 (NO CLIP): - INVASIVE LOBULAR CARCINOMA, GRADE II, SEE COMMENT. - LOBULAR CARCINOMA IN SITU. - LYMPHOVASCULAR INVASION IDENTIFIED. - THREE LYMPH NODES, POSITIVE FOR METASTATIC MAMMARY CARCINOMA (3/7). - TUMOR DEPOSITS ARE 1.7, 1.6, AND 5 CM. - EXTRACAPSULAR TUMOR EXTENSION IDENTIFIED. - TWO LYMPH NODES, POSITIVE FOR ISOLATED TUMOR CELLS. Microscopic Comment BREAST, INVASIVE TUMOR, WITH LYMPH NODE SAMPLING Specimen, including laterality: Right  breast Procedure: Modified radical mastectomy Grade: Tumor 1 and tumor 2: Grade II of III Tubule formation: 3 Nuclear pleomorphism: 2 Mitotic: 1 Tumor size (gross measurement: Tumor #1- 2.3 cm; tumor #2 - 2.4 cm Margins: Invasive, distance  to closest margin: Tumor #1 2.7 cm - deep margin; tumor #2 - 1.6 cm - deep margin Lymphovascular invasion: Present Ductal carcinoma in situ: Absent Grade: N/A Extensive intraductal component: N/A Lobular neoplasia: Both atypical hyperplasia and in situ carcinoma Extent of tumor: Skin: Grossly negative Nipple: Grossly negative Skeletal muscle: Microscopically negative Lymph nodes: # examined: 7 Lymph nodes with metastasis: 3 Isolated tumor cells (< 0.2 mm): two lymph nodes (Slide 1P and 1O) Micrometastasis: (> 0.2 mm and < 2.0 mm): none Macrometastasis: (> 2.0 mm): 1.7 cm , 1.6 cm and 5 cm Extracapsular extension: Present Breast prognostic profile: Tumor #1: Estrogen receptor: Not repeated, previous study demonstrated 98% positivity ([id number]) Progesterone receptor: Not repeated, previous study demonstrated 0% positivity ([id number]) Her 2 neu: Repeated, previous study demonstrated no amplification (1.38) ([id number]) Ki-67: Not repeated, previous study demonstrated 5% proliferation rate ([id number]) Tumor #2: Estrogen receptor: Not repeated, previous study demonstrated 97% positivity ([id number]) Progesterone receptor: Not repeated, previous study demonstrated 0% positivity ([id number]) Her 2 neu: Repeated, previous study demonstrated amplification (3.65) ([id number]) Ki-67: Not repeated, previous study demonstrated 44% proliferation rate ([id number]) 2 of 4 FINAL for HUBERTA, TOMPKINS (SZA14-4) Microscopic Comment(continued) Non-neoplastic breast: Benign fibrocystic change with usual ductal hyperplasia, sclerosing adenosis, benign radial sclerosing lesion, and microcalcifications in benign ducts and lobules. TNM: mpT2, pN1a,  pMX. Comments: none  RADIOGRAPHIC STUDIES:  Mr Lodema Pilot Contrast  03/23/2012  *RADIOLOGY REPORT*  Clinical Data: New diagnosis breast cancer.  Rule out metastatic disease.  MRI HEAD WITHOUT AND WITH CONTRAST  Technique:  Multiplanar, multiecho pulse sequences of the brain and surrounding structures were obtained according to standard protocol without and with intravenous contrast  Contrast: 12mL MULTIHANCE GADOBENATE DIMEGLUMINE 529 MG/ML IV SOLN  Comparison: None.  Findings: Ventricle size is normal.  Small 4 mm hyperintensities in the cerebral white matter bilaterally do not enhance and are most likely due to chronic microvascular ischemia.  Negative for acute infarct.  Diffusion weighted imaging is normal.  Pituitary is normal in size.  Negative for intracranial hemorrhage.  Following contrast infusion, no enhancing lesions are identified.  Paranasal sinuses are clear.  Vessels at the base of the brain are patent.  IMPRESSION: Negative for metastatic disease.  No acute abnormality.   Original Report Authenticated By: Janeece Riggers, M.D.    Mr Breast Bilateral W Wo Contrast  03/12/2012  *RADIOLOGY REPORT*  Clinical Data: Recently diagnosed invasive mammary carcinoma in the 10 o'clock region of the right breast with a metastatic right axillary lymph node.  BILATERAL BREAST MRI WITH AND WITHOUT CONTRAST  Technique: Multiplanar, multisequence MR images of both breasts were obtained prior to and following the intravenous administration of 12ml of multihance.  Three dimensional images were evaluated at the independent DynaCad workstation.  Comparison:  Mammograms dated 03/05/2012, 03/01/2012, 03/01/2011 and 02/26/2010 from [facility].  Findings: There is a moderate background parenchymal enhancement pattern.  Right breast: 1.  There is a lobulated 2.2 x 1.7 x 1.8 cm mass in the posterior third of the upper outer quadrant of the right breast.  There is a post biopsy clip artifact 1 cm lateral to the  mass. 2.  In the anterior third upper outer quadrant of the right breast there is patchy non mass like enhancement measuring 0.8 x 0.3 x 0.3 cm.  Left breast:  There is no abnormal enhancement the left breast.  There are enlarged right axillary lymph nodes.  The largest right axillary lymph node measures 2.4 cm and is located  lateral to the pectoralis minor consistent with level I adenopathy.  There is a 1.6 cm lymph node behind the pectoralis minor consistent with enlarged level II adenopathy.  IMPRESSION:  1.  2.2 cm mass in the posterior third of the upper outer quadrant of the right breast corresponding with the known invasive mammary carcinoma. 2.  Patchy non mass-like enhancement in the anterior third of the upper outer quadrant of the right breast.  MR guided core biopsy is recommended. 3. Enlarged level I and II right axillary adenopathy.  RECOMMENDATION: MR guided core biopsy of the right breast.  THREE-DIMENSIONAL MR IMAGE RENDERING ON INDEPENDENT WORKSTATION:  Three-dimensional MR images were rendered by post-processing of the original MR data on an independent workstation.  The three- dimensional MR images were interpreted, and findings were reported in the accompanying complete MRI report for this study.  BI-RADS CATEGORY 4:  Suspicious abnormality - biopsy should be considered.   Original Report Authenticated By: Baird Lyons, M.D.    Mr Biopsy/wire Localization  03/20/2012  *RADIOLOGY REPORT*  Clinical Data:  Known right breast carcinoma.  Additional area of worrisome enhancement located within the anterior upper outer quadrant of the right breast.  MRI GUIDED VACUUM ASSISTED BIOPSY OF THE RIGHT BREAST WITHOUT AND WITH CONTRAST  Comparison: Previous exams.  Technique: Multiplanar, multisequence MR images of the right breast were obtained prior to and following the intravenous administration of 12 ml of Mulithance.  I met with the patient, and we discussed the procedure of MRI guided biopsy,  including risks, benefits, and alternatives. Specifically, we discussed the risks of infection, bleeding, tissue injury, clip migration, and inadequate sampling.  Informed, written consent was given.  Using sterile technique, 2% Lidocaine, MRI guidance, and a 9 gauge vacuum assisted device, biopsy was performed of the area of enhancement located within the anterior portion of the upper-outer quadrant - right breast using a lateral approach.  At the conclusion of the procedure, a  tissue marker clip was deployed into the biopsy cavity.  IMPRESSION: MRI guided biopsy of the area of enhancement located within the anterior portion of the upper-outer quadrant of the right breast as discussed above. No apparent complications.  BI-RADS CATEGORY 6:  Known biopsy-proven malignancy - appropriate action should be taken.  THREE-DIMENSIONAL MR IMAGE RENDERING ON INDEPENDENT WORKSTATION:  Three-dimensional MR images were rendered by post-processing of the original MR data on an independent workstation.  The three- dimensional MR images were interpreted, and findings were reported in the accompanying complete MRI report for this study.  The pathology associated with the right breast MR guided core biopsy demonstrated invasive mammary carcinoma and in situ mammary carcinoma.  This is concordant with imaging findings.  I have discussed findings with the patient by telephone and answered her questions.  The patient states that biopsy site is clean and dry without hematoma formation or signs of infection.  Post biopsy wound care instructions were reviewed with the patient.  Follow-up will be with Dr. Carolynne Edouard and Dr. Welton Flakes regards her treatment plan.  The patient was encouraged to call the Breast Center for additional questions or concerns   Original Report Authenticated By: Rolla Plate, M.D.    Nm Pet Image Initial (pi) Skull Base To Thigh  03/22/2012  *RADIOLOGY REPORT*  Clinical Data: Initial treatment strategy for high risk  right breast cancer.  NUCLEAR MEDICINE PET SKULL BASE TO THIGH  Fasting Blood Glucose:  100  Technique:  18.6 mCi F-18 FDG was injected intravenously. CT data was  obtained and used for attenuation correction and anatomic localization only.  (This was not acquired as a diagnostic CT examination.) Additional exam technical data entered on technologist worksheet.  Comparison:  MRI breast dated 03/12/2012  Findings:  Neck: No hypermetabolic lymph nodes in the neck.  Chest:  Vague hypermetabolism in the upper/central right breast, max SUV 2.7.  Additional focal hypermetabolism in the upper/outer right breast, max SUV 2.3 (PET image 105).  These findings correspond to biopsy-proven primary breast carcinoma.  1.3 cm short axis right subpectoral lymph node (series 2/image 66), max SUV 3.1.  Right axillary lymphadenopathy measuring up to 1.6 cm short axis (series 2/image 88), max SUV 4.8).  No hypermetabolic mediastinal or hilar nodes.  No suspicious pulmonary nodules on the CT scan.  Abdomen/Pelvis:  No abnormal hypermetabolic activity within the liver, pancreas, adrenal glands, or spleen.  No hypermetabolic lymph nodes in the abdomen or pelvis.  Skeleton:  No focal hypermetabolic activity to suggest skeletal metastasis.  IMPRESSION: Two foci of hypermetabolism in the upper right breast, max SUV 2.7, corresponding to biopsy-proven primary breast carcinoma.  Associated right subpectoral and axillary nodal metastases, max SUV 4.8.  No evidence of distant metastases.   Original Report Authenticated By: Charline Bills, M.D.    Mm Digital Diagnostic Unilat R  03/20/2012  *RADIOLOGY REPORT*  Clinical Data:  Post right breast MR guided core biopsy.  DIGITAL DIAGNOSTIC RIGHT BREAST MAMMOGRAM  Comparison:  03/05/2012, 03/01/2012, 03/01/2011, 02/26/2010 mammograms from [facility] imaging.  Findings:  Films are performed following MR guided biopsy of the anterior portion of the upper-outer quadrant of the right breast. The hourglass  shaped clip placed during the MR guided core biopsy appears in appropriate position.  IMPRESSION: Appropriate positioning of clip following right breast MR guided core biopsy.   Original Report Authenticated By: Rolla Plate, M.D.     ASSESSMENT: 67 year old [place], [place] woman  #1  status post mastectomy with right axillary lymph node dissection 05/10/2012 for an mpT2 N1, stage stage IIB invasive lobular breast cancer, stage IIB, ER positive, HER-2/neu positive, with an Mib-1 between 5% and 44%  #2 receiving adjuvant chemotherapy consisting of Taxotere, carboplatinum and Herceptin given every 21 days for total of 6 cycles of Taxotere carboplatinum and weekly Herceptin beginning on 06/14/12.  #3 Herceptin to be continued to total one year; most recent echo 03/28/2012  PLAN:   1. Ms. Ballester is doing well. She will proceed with Herceptin. She is neutropenic.  I reviewed her neutropenic instructions with her in detail.  She will continue on Doxycycline for her recent tick bite for 3 more days.  She will call us if she starts to have low grade fevers or chills.    2. She will continue to take Super B complex daily. The numbness is minimal.    3. Ms. Tunison will return next week for labs, appt, Herceptin.     All questions were answered. The patient knows to call the clinic with any problems, questions or concerns. We can certainly see the patient much sooner if necessary.   I spent 25 minutes counseling the patient face to face. The total time spent in the appointment was 30 minutes.  Cherie Ouch Lyn Hollingshead, NP Medical Oncology [facility] Phone: [phone] 09/13/2012, 10:41 AM

## 2012-09-13 NOTE — Progress Notes (Signed)
CC: Dr. Chevis Pretty, Dr. Drue Second  Followup note:  Diagnosis: Pathologic stage IIB (T2 N1 M0) invasive lobular carcinoma of the right breast.  History: Ms. Ketron returns today for review and scheduling of her post mastectomy radiation therapy in the management of her T2 N1 invasive lobular carcinoma of the right breast. I first saw her at the [facility] on 03/14/2012. She presented with multicentric invasive lobular carcinoma along with axillary adenopathy. On 05/18/2012 she underwent a modified radical mastectomy by Dr. Carolynne Edouard. She is found to have invasive lobular carcinoma with LV I. 1 tumor measured 2.3 cm and the other 2.4 cm. The deep margin was at least 1.6 cm. 3 of 7 lymph nodes contained metastatic disease all macro metastases. One lymph node measured 5.0 cm. There was extracapsular extension present. She went on to receive adjuvant HER-2 based chemotherapy with Taxotere/carboplatin. Her last chemotherapy is scheduled for May 22. Her chemotherapy has been relatively well tolerated although she has been fatigued following each cycle. She states that she's had periodic swelling of her right proximal upper extremity but no significant lymphedema distally.  Physical examination: Alert and oriented.  Filed Vitals:   09/13/12 0840  BP: 128/71  Pulse: 93  Temp: 98.4 F (36.9 C)   Head neck examination: She wears a wig. Nodes: Without palpable cervical, supraclavicular, or axillary lymphadenopathy. Chest: Lungs clear. Right chest wall without visible or palpable evidence for recurrent disease. Heart: Regular rate rhythm. Left breast without masses or lesions. Back: Without spinal or CVA tenderness. Extremities: Without edema.  Data:  Lab Results  Component Value Date   WBC 2.0* 09/13/2012   HGB 10.7* 09/13/2012   HCT 31.4* 09/13/2012   MCV 94.9 09/13/2012   PLT 105* 09/13/2012   Impression: Stage IIB (T2 N1 M0) invasive lobular carcinoma of the right breast. I explained to the patient and her husband  that her indications for post mastectomy radiation therapy include greater than 25% nodal involvement and the presence of 3 positive lymph nodes, one of which measured 5 cm with extranodal extension. We discussed the [place], and [place] phase I post mastectomy trials with 1-3 positive lymph nodes. We discussed the potential acute and late toxicities of radiation therapy including right upper extremity lymphedema. Consent is signed today. I'll have her return for simulation/treatment planning on June 9, and begin her radiation therapy the week of June 16 provided that her blood counts are satisfactory.  Plan: As discussed above.  30 minutes was spent face-to-face with the patient, primarily counseling the patient and coordinating her care.

## 2012-09-13 NOTE — Progress Notes (Signed)
Location of Breast Cancer: Right breast  Histology per Pathology Report:  Receptor Status: ER(+), PR (+), Her2-neu (-)  Did patient present with symptoms (if so, please note symptoms) or was this found on screening mammography?:Mass not palpated.  Past/Anticipated interventions by surgeon, if any: Right mastectomy with axillary ln dissection  Past/Anticipated interventions by medical oncology, if anyPatient has one more chemotherapy treatment of taxotere scheduled for May 22,2014 and then herceptin for one year.  Lymphedema issues, if any:  Slight swelling of right arm but nothing major.  Pain issues, if any:  No  SAFETY ISSUES:  Prior radiation? no  Pacemaker/ICD? no  Possible current pregnancy? no  Is the patient on methotrexate? no  Current Complaints / other details: question how soon radiation to start as "it takes me a few week to get over chemotherapy.

## 2012-09-17 ENCOUNTER — Ambulatory Visit (HOSPITAL_BASED_OUTPATIENT_CLINIC_OR_DEPARTMENT_OTHER)
Admission: RE | Admit: 2012-09-17 | Discharge: 2012-09-17 | Disposition: A | Discharge status: Home / Self Care | Payer: Medicare Other | Source: Ambulatory Visit | Attending: Internal Medicine | Admitting: Internal Medicine

## 2012-09-17 ENCOUNTER — Encounter (HOSPITAL_COMMUNITY): Payer: Self-pay

## 2012-09-17 ENCOUNTER — Ambulatory Visit (HOSPITAL_COMMUNITY)
Admission: RE | Admit: 2012-09-17 | Discharge: 2012-09-17 | Disposition: A | Discharge status: Home / Self Care | Payer: Medicare Other | Source: Ambulatory Visit | Attending: Family Medicine | Admitting: Family Medicine

## 2012-09-17 VITALS — BP 140/80 | HR 96 | Ht 64.0 in | Wt 140.5 lb

## 2012-09-17 DIAGNOSIS — C50919 Malignant neoplasm of unspecified site of unspecified female breast: Secondary | ICD-10-CM | POA: Insufficient documentation

## 2012-09-17 DIAGNOSIS — I079 Rheumatic tricuspid valve disease, unspecified: Secondary | ICD-10-CM | POA: Insufficient documentation

## 2012-09-17 DIAGNOSIS — C50419 Malignant neoplasm of upper-outer quadrant of unspecified female breast: Secondary | ICD-10-CM

## 2012-09-17 DIAGNOSIS — R42 Dizziness and giddiness: Secondary | ICD-10-CM | POA: Insufficient documentation

## 2012-09-17 DIAGNOSIS — C50411 Malignant neoplasm of upper-outer quadrant of right female breast: Secondary | ICD-10-CM

## 2012-09-17 DIAGNOSIS — Z79899 Other long term (current) drug therapy: Secondary | ICD-10-CM | POA: Insufficient documentation

## 2012-09-17 DIAGNOSIS — I059 Rheumatic mitral valve disease, unspecified: Secondary | ICD-10-CM | POA: Insufficient documentation

## 2012-09-17 DIAGNOSIS — K219 Gastro-esophageal reflux disease without esophagitis: Secondary | ICD-10-CM | POA: Insufficient documentation

## 2012-09-17 DIAGNOSIS — E785 Hyperlipidemia, unspecified: Secondary | ICD-10-CM | POA: Insufficient documentation

## 2012-09-17 DIAGNOSIS — Z170 Estrogen receptor positive status [ER+]: Secondary | ICD-10-CM | POA: Insufficient documentation

## 2012-09-17 DIAGNOSIS — I10 Essential (primary) hypertension: Secondary | ICD-10-CM | POA: Insufficient documentation

## 2012-09-17 NOTE — Assessment & Plan Note (Addendum)
I reviewed echos personally. EF and Doppler parameters stable. No HF on exam. Continue Herceptin.

## 2012-09-17 NOTE — Progress Notes (Signed)
Patient ID: Heather [name], female   DOB: 10/13/1945, 67 y.o.   MRN: [id number] Referring Oncologist: Dr Welton Flakes Oncologistt: Dr Welton Flakes PCP: Dr Cliffton Asters General Surgeon: Dr Carolynne Edouard  HPI: [name] is a 67 year old female with invasive ductal carcinoma in R breast that is ER positive and HER-2/neu positive,  S/P Ror R mastectomy and lymph node dissection 05/10/12. Plan to adjuvantly receive Taxotere carboplatinum and Herceptin. She has been treated for HTN in the past but stopped taking the medications due to elevated renal function. Intoleratn Ace-I due to cough.    She will receive a total of 6 cycles of Taxotere/carboplatin starting on 06/14/2012. Once she  completes her chemotherapy then she will go on to receive radiation therapy along with Herceptin. She will then continue on Herceptin every 3 weeks to finish a total of one year.   She returns for follow up. Last visit spironolactone 12.5 mg added for hypertension.(Sep 13, 2012 creatinine 0.8 Potassium 3.7) . Tolerating chemo OK. Has one more week of Taxotere and Carbo. Getting Herceptin every week and then in June will switch every 3 weeks.  Occasional mild edema in ankles and hands. No dyspnea or orthopnea.    03/28/12 ECHO 60-65% Lateral S' 9.3 09/17/12 ECHO 55% lateral s' 9.4    Past Medical History  Diagnosis Date  . Chronic kidney disease   . Hx: UTI (urinary tract infection)   . Right shoulder pain   . Right knee pain   . Hypertension     recently started Aldactone   . Hyperlipidemia     but not on meds;diet and exercise controlled  . Dizziness     has been going on for 30months;medical MD aware  . H/O hiatal hernia   . GERD (gastroesophageal reflux disease)     Tums prn  . Hemorrhoids   . Urinary frequency     d/t taking Aldactone  . History of UTI   . Anemia     hx of  . Cataract     immature-not sure of which eye  . Anxiety     r/t updated surgery  . Insomnia     takes Melatoniin daily  . Complication of anesthesia     pt  states she is sensitive to meds  . Breast cancer     right  . Skin cancer     Current Outpatient Prescriptions  Medication Sig Dispense Refill  . B Complex-C (SUPER B COMPLEX PO) Take 1 each by mouth daily.      . calcium carbonate (TUMS - DOSED IN MG ELEMENTAL CALCIUM) 500 MG chewable tablet Chew 1 tablet by mouth daily.      . Cholecalciferol (VITAMIN D-3 PO) Take 4,000 Units by mouth daily.      . ciprofloxacin (CIPRO) 500 MG tablet       . clindamycin (CLINDAGEL) 1 % gel Apply topically 2 (two) times daily.  30 g  0  . dexamethasone (DECADRON) 4 MG tablet Take 2 tablets (8 mg total) by mouth 2 (two) times daily with a meal. Take two times a day the day before Taxotere. Then take two times a day starting the day after chemo for 3 days.  36 tablet  5  . doxycycline (VIBRA-TABS) 100 MG tablet Take 1 tablet (100 mg total) by mouth 2 (two) times daily.  20 tablet  0  . lidocaine-prilocaine (EMLA) cream Apply topically as needed.  30 g  7  . Loratadine (CLARITIN PO) Take 1 each by  mouth daily.      Marland Kitchen LORazepam (ATIVAN) 0.5 MG tablet Take 1 tablet (0.5 mg total) by mouth every 6 (six) hours as needed (Nausea or vomiting).  30 tablet  0  . magnesium gluconate (MAGONATE) 500 MG tablet Take 500 mg by mouth 2 (two) times daily.      Marland Kitchen omeprazole (PRILOSEC) 40 MG capsule Take 1 capsule (40 mg total) by mouth daily.  30 capsule  6  . ondansetron (ZOFRAN) 8 MG tablet Take 1 tablet (8 mg total) by mouth 2 (two) times daily. Take two times a day starting the day after chemo for 3 days. Then take two times a day as needed for nausea or vomiting.  30 tablet  1  . PREVIDENT 5000 BOOSTER PLUS 1.1 % PSTE Take 1 application by mouth Twice daily.      . Probiotic Product (PROBIOTIC DAILY PO) Take 1 each by mouth.      . prochlorperazine (COMPAZINE) 10 MG tablet Take 1 tablet (10 mg total) by mouth every 6 (six) hours as needed (Nausea or vomiting).  30 tablet  1  . prochlorperazine (COMPAZINE) 25 MG suppository  Place 1 suppository (25 mg total) rectally every 12 (twelve) hours as needed for nausea.  12 suppository  3  . spironolactone (ALDACTONE) 25 MG tablet Take 0.5 tablets (12.5 mg total) by mouth daily.  30 tablet  3   No current facility-administered medications for this encounter.     Allergies  Allergen Reactions  . Codeine     " loopy"  . Keflex (Cephalexin)     GI Upset  . Naprosyn (Naproxen) Other (See Comments)    GI ipset   . Tussin (Guaifenesin) Other (See Comments)    Dizzy     History   Social History  . Marital Status: Married    Spouse Name: N/A    Number of Children: 2  . Years of Education: N/A   Occupational History  . Not on file.   Social History Main Topics  . Smoking status: Never Smoker   . Smokeless tobacco: Never Used  . Alcohol Use: No  . Drug Use: No  . Sexually Active: Yes    Birth Control/ Protection: Post-menopausal   Other Topics Concern  . Not on file   Social History Narrative  . No narrative on file    Family History  Problem Relation Age of Onset  . Leukemia Mother   . Colon cancer Brother   . Cancer Brother     Prostate  . Colon cancer Maternal Grandmother     PHYSICAL EXAM: Filed Vitals:   09/17/12 1602  BP: 140/80  Pulse: 96   General:  Well appearing. No respiratory difficulty HEENT: normal Neck: supple. no JVD. Carotids 2+ bilat; no bruits. No lymphadenopathy or thryomegaly appreciated. Cor: PMI nondisplaced. Regular rate & rhythm. No rubs, gallops or murmurs. Lungs: clear Abdomen: soft, nontender, nondistended. No hepatosplenomegaly. No bruits or masses. Good bowel sounds. Extremities: no cyanosis, clubbing, rash, trace lower extremity edema. Neuro: alert & oriented x 3, cranial nerves grossly intact. moves all 4 extremities w/o difficulty. Affect pleasant.    No results found for this or any previous visit (from the past 24 hour(s)). No results found.   ASSESSMENT & PLAN:

## 2012-09-17 NOTE — Patient Instructions (Signed)
Follow up in 3 months with an ECHO and Dr Bensimhon 

## 2012-09-17 NOTE — Progress Notes (Signed)
  Echocardiogram 2D Echocardiogram has been performed.  Artina Minella 09/17/2012, 4:01 PM

## 2012-09-19 ENCOUNTER — Other Ambulatory Visit: Payer: Self-pay | Admitting: Emergency Medicine

## 2012-09-19 DIAGNOSIS — C50419 Malignant neoplasm of upper-outer quadrant of unspecified female breast: Secondary | ICD-10-CM

## 2012-09-20 ENCOUNTER — Ambulatory Visit (HOSPITAL_BASED_OUTPATIENT_CLINIC_OR_DEPARTMENT_OTHER): Payer: Medicare Other

## 2012-09-20 ENCOUNTER — Telehealth: Payer: Self-pay | Admitting: *Deleted

## 2012-09-20 ENCOUNTER — Encounter: Payer: Self-pay | Admitting: Adult Health

## 2012-09-20 ENCOUNTER — Other Ambulatory Visit (HOSPITAL_BASED_OUTPATIENT_CLINIC_OR_DEPARTMENT_OTHER): Payer: Medicare Other | Admitting: Lab

## 2012-09-20 ENCOUNTER — Ambulatory Visit (HOSPITAL_BASED_OUTPATIENT_CLINIC_OR_DEPARTMENT_OTHER): Payer: Medicare Other | Admitting: Adult Health

## 2012-09-20 VITALS — BP 119/70 | HR 98 | Temp 98.3°F | Resp 20 | Ht 64.0 in | Wt 142.2 lb

## 2012-09-20 DIAGNOSIS — Z170 Estrogen receptor positive status [ER+]: Secondary | ICD-10-CM

## 2012-09-20 DIAGNOSIS — G629 Polyneuropathy, unspecified: Secondary | ICD-10-CM

## 2012-09-20 DIAGNOSIS — C50411 Malignant neoplasm of upper-outer quadrant of right female breast: Secondary | ICD-10-CM

## 2012-09-20 DIAGNOSIS — C50419 Malignant neoplasm of upper-outer quadrant of unspecified female breast: Secondary | ICD-10-CM

## 2012-09-20 DIAGNOSIS — Z5112 Encounter for antineoplastic immunotherapy: Secondary | ICD-10-CM

## 2012-09-20 LAB — COMPREHENSIVE METABOLIC PANEL (CC13)
ALT: 16 U/L (ref 0–55)
AST: 18 U/L (ref 5–34)
Albumin: 3.3 g/dL — ABNORMAL LOW (ref 3.5–5.0)
Alkaline Phosphatase: 81 U/L (ref 40–150)
BUN: 8.5 mg/dL (ref 7.0–26.0)
CO2: 26 mEq/L (ref 22–29)
Calcium: 9 mg/dL (ref 8.4–10.4)
Chloride: 103 mEq/L (ref 98–107)
Creatinine: 0.9 mg/dL (ref 0.6–1.1)
Glucose: 118 mg/dl — ABNORMAL HIGH (ref 70–99)
Potassium: 3.8 mEq/L (ref 3.5–5.1)
Sodium: 139 mEq/L (ref 136–145)
Total Bilirubin: 0.48 mg/dL (ref 0.20–1.20)
Total Protein: 6.4 g/dL (ref 6.4–8.3)

## 2012-09-20 LAB — CBC WITH DIFFERENTIAL/PLATELET
BASO%: 0.2 % (ref 0.0–2.0)
Basophils Absolute: 0 10*3/uL (ref 0.0–0.1)
EOS%: 0.2 % (ref 0.0–7.0)
Eosinophils Absolute: 0 10*3/uL (ref 0.0–0.5)
HCT: 27.2 % — ABNORMAL LOW (ref 34.8–46.6)
HGB: 8.9 g/dL — ABNORMAL LOW (ref 11.6–15.9)
LYMPH%: 20.7 % (ref 14.0–49.7)
MCH: 32.8 pg (ref 25.1–34.0)
MCHC: 32.7 g/dL (ref 31.5–36.0)
MCV: 100.4 fL (ref 79.5–101.0)
MONO#: 0.4 10*3/uL (ref 0.1–0.9)
MONO%: 7.6 % (ref 0.0–14.0)
NEUT#: 3.6 10*3/uL (ref 1.5–6.5)
NEUT%: 71.3 % (ref 38.4–76.8)
Platelets: 57 10*3/uL — ABNORMAL LOW (ref 145–400)
RBC: 2.71 10*6/uL — ABNORMAL LOW (ref 3.70–5.45)
RDW: 18.5 % — ABNORMAL HIGH (ref 11.2–14.5)
WBC: 5 10*3/uL (ref 3.9–10.3)
lymph#: 1 10*3/uL (ref 0.9–3.3)
nRBC: 1 % — ABNORMAL HIGH (ref 0–0)

## 2012-09-20 MED ORDER — ACETAMINOPHEN 325 MG PO TABS
650.0000 mg | ORAL_TABLET | Freq: Once | ORAL | Status: AC
  Administered 2012-09-20: 650 mg via ORAL

## 2012-09-20 MED ORDER — SODIUM CHLORIDE 0.9 % IV SOLN
Freq: Once | INTRAVENOUS | Status: AC
  Administered 2012-09-20: 10:00:00 via INTRAVENOUS

## 2012-09-20 MED ORDER — SODIUM CHLORIDE 0.9 % IV SOLN
2.0000 mg/kg | Freq: Once | INTRAVENOUS | Status: AC
  Administered 2012-09-20: 126 mg via INTRAVENOUS
  Filled 2012-09-20: qty 6

## 2012-09-20 MED ORDER — HEPARIN SOD (PORK) LOCK FLUSH 100 UNIT/ML IV SOLN
500.0000 [IU] | Freq: Once | INTRAVENOUS | Status: AC | PRN
  Administered 2012-09-20: 500 [IU]
  Filled 2012-09-20: qty 5

## 2012-09-20 MED ORDER — GABAPENTIN 100 MG PO CAPS: 100.0000 mg | ORAL_CAPSULE | Freq: Three times a day (TID) | ORAL | Status: DC

## 2012-09-20 MED ORDER — DIPHENHYDRAMINE HCL 25 MG PO CAPS
50.0000 mg | ORAL_CAPSULE | Freq: Once | ORAL | Status: AC
  Administered 2012-09-20: 25 mg via ORAL

## 2012-09-20 MED ORDER — SODIUM CHLORIDE 0.9 % IJ SOLN
10.0000 mL | INTRAMUSCULAR | Status: DC | PRN
  Administered 2012-09-20: 10 mL
  Filled 2012-09-20: qty 10

## 2012-09-20 NOTE — Patient Instructions (Signed)
[facility] Discharge Instructions for Patients Receiving Chemotherapy  Today you received the following chemotherapy agents Herceptin To help prevent nausea and vomiting after your treatment, we encourage you to take your nausea medication as prescribed.  If you develop nausea and vomiting that is not controlled by your nausea medication, call the clinic. If it is after clinic hours your family physician or the after hours number for the clinic or go to the Emergency Department.   BELOW ARE SYMPTOMS THAT SHOULD BE REPORTED IMMEDIATELY:  *FEVER GREATER THAN 100.5 F  *CHILLS WITH OR WITHOUT FEVER  NAUSEA AND VOMITING THAT IS NOT CONTROLLED WITH YOUR NAUSEA MEDICATION  *UNUSUAL SHORTNESS OF BREATH  *UNUSUAL BRUISING OR BLEEDING  TENDERNESS IN MOUTH AND THROAT WITH OR WITHOUT PRESENCE OF ULCERS  *URINARY PROBLEMS  *BOWEL PROBLEMS  UNUSUAL RASH Items with * indicate a potential emergency and should be followed up as soon as possible.  One of the nurses will contact you 24 hours after your treatment. Please let the nurse know about any problems that you may have experienced. Feel free to call the clinic you have any questions or concerns. The clinic phone number is [phone].   I have been informed and understand all the instructions given to me. I know to contact the clinic, my physician, or go to the Emergency Department if any problems should occur. I do not have any questions at this time, but understand that I may call the clinic during office hours   should I have any questions or need assistance in obtaining follow up care.    __________________________________________  _____________  __________ Signature of Patient or Authorized Representative            Date                   Time    __________________________________________ Nurse's Signature    

## 2012-09-20 NOTE — Telephone Encounter (Signed)
appts made and printed...td 

## 2012-09-20 NOTE — Progress Notes (Signed)
OFFICE PROGRESS NOTE   Heather Bradford, MD [address] [place] [address] Dr. Chevis Pretty Dr. Chipper Herb  DIAGNOSIS: 67 year old female with invasive lobular breast cancer, stage IIB, receiving adjuvant chemotherapy  PRIOR THERAPY:  #1 patient was originally seen in the multidisciplinary breast clinic on 03/14/2012 for new diagnosis of invasive ductal carcinoma that was HER-2 positive node-positive, ER positive PR negative with Ki-67 of 44%  #2 on MRI patient also had an anterior breast mass that was to be biopsied to decide whether or not she should receive neoadjuvant chemotherapy or adjuvant chemotherapy. Patient's mass was biopsied and it was positive for a malignancy that was HER-2/neu negative.  #3 patient is now status post right mastectomy with axillary lymph node dissection with the final pathology revealing the largest tumor measuring 5 cm, 1.7 cm and 1.6 cm. Tumor was ER positive PR negative HER-2/neu positive with a Ki-67 of 44% 3 of 7 lymph nodes were positive for metastatic disease.  #4 patient will proceed with adjuvant chemotherapy and Herceptin. Chemotherapy will consist of Taxotere carboplatinum given every 21 days with weekly Herceptin. Total of 6 cycles of Taxotere carboplatin will be administered starting on 06/14/2012. Once patient completes her chemotherapy then she will go on to receive radiation therapy along with Herceptin with the Herceptin then being converted to every 3 weeks to finish a total of one year. Because patient's tumor is ER positive she will also be given antiestrogen therapy adjuvantly.  CURRENT THERAPY:Cycle 5 day 15 of TCH  INTERVAL HISTORY: Heather [name] is here for f/u prior to her adjuvant treatment of her right breast cancer.  She is feeling moderately well today.  She had her appt with Dr. Gala Romney on 09/17/12 and was cleared to continue Herceptin therapy.  She has noticed swelling in her right hand after pulling weeds in the garden.   She's been doing her exercises for lymphedema she learned during physical therapy and it has improved.  She does have a sleeve, but isn't wearing it.  Her fingertips are numb, and her fingernails are tender.  She is still buttoning with small buttons, picking up small pills, but she did have difficulty opening a can tab due to her finger sensitivity.  Otherwise a 10 point ROS is neg.    MEDICAL HISTORY: Past Medical History  Diagnosis Date  . Chronic kidney disease   . Hx: UTI (urinary tract infection)   . Right shoulder pain   . Right knee pain   . Hypertension     recently started Aldactone   . Hyperlipidemia     but not on meds;diet and exercise controlled  . Dizziness     has been going on for 60months;medical MD aware  . H/O hiatal hernia   . GERD (gastroesophageal reflux disease)     Tums prn  . Hemorrhoids   . Urinary frequency     d/t taking Aldactone  . History of UTI   . Anemia     hx of  . Cataract     immature-not sure of which eye  . Anxiety     r/t updated surgery  . Insomnia     takes Melatoniin daily  . Complication of anesthesia     pt states she is sensitive to meds  . Breast cancer     right  . Skin cancer     ALLERGIES:  is allergic to codeine; keflex; naprosyn; and tussin.  MEDICATIONS:  Current Outpatient Prescriptions  Medication Sig Dispense  Refill  . B Complex-C (SUPER B COMPLEX PO) Take 1 each by mouth daily.      . calcium carbonate (TUMS - DOSED IN MG ELEMENTAL CALCIUM) 500 MG chewable tablet Chew 1 tablet by mouth daily.      . Cholecalciferol (VITAMIN D-3 PO) Take 4,000 Units by mouth daily.      . clindamycin (CLINDAGEL) 1 % gel Apply topically 2 (two) times daily.  30 g  0  . dexamethasone (DECADRON) 4 MG tablet Take 2 tablets (8 mg total) by mouth 2 (two) times daily with a meal. Take two times a day the day before Taxotere. Then take two times a day starting the day after chemo for 3 days.  36 tablet  5  . doxycycline (VIBRA-TABS) 100 MG  tablet Take 1 tablet (100 mg total) by mouth 2 (two) times daily.  20 tablet  0  . lidocaine-prilocaine (EMLA) cream Apply topically as needed.  30 g  7  . Loratadine (CLARITIN PO) Take 1 each by mouth daily.      Marland Kitchen LORazepam (ATIVAN) 0.5 MG tablet Take 1 tablet (0.5 mg total) by mouth every 6 (six) hours as needed (Nausea or vomiting).  30 tablet  0  . magnesium gluconate (MAGONATE) 500 MG tablet Take 500 mg by mouth 2 (two) times daily.      Marland Kitchen omeprazole (PRILOSEC) 40 MG capsule Take 1 capsule (40 mg total) by mouth daily.  30 capsule  6  . ondansetron (ZOFRAN) 8 MG tablet Take 1 tablet (8 mg total) by mouth 2 (two) times daily. Take two times a day starting the day after chemo for 3 days. Then take two times a day as needed for nausea or vomiting.  30 tablet  1  . PREVIDENT 5000 BOOSTER PLUS 1.1 % PSTE Take 1 application by mouth Twice daily.      . Probiotic Product (PROBIOTIC DAILY PO) Take 1 each by mouth.      . prochlorperazine (COMPAZINE) 10 MG tablet Take 1 tablet (10 mg total) by mouth every 6 (six) hours as needed (Nausea or vomiting).  30 tablet  1  . prochlorperazine (COMPAZINE) 25 MG suppository Place 1 suppository (25 mg total) rectally every 12 (twelve) hours as needed for nausea.  12 suppository  3  . spironolactone (ALDACTONE) 25 MG tablet Take 0.5 tablets (12.5 mg total) by mouth daily.  30 tablet  3   No current facility-administered medications for this visit.    SURGICAL HISTORY:  Past Surgical History  Procedure Laterality Date  . Cyst removed from left breast  1970  . Left wrist surgery   2004    with plate  . Colonoscopy    . Esophagogastroduodenoscopy    . Mastectomy, radical    . Mastectomy modified radical  05/10/2012    Procedure: MASTECTOMY MODIFIED RADICAL;  Surgeon: Robyne Askew, MD;  Location: [facility] OR;  Service: General;  Laterality: Right;  RIGHT MODIFIED RADICAL MASTECTOMY  . Portacath placement  05/10/2012    Procedure: INSERTION PORT-A-CATH;  Surgeon:  Robyne Askew, MD;  Location: [facility] OR;  Service: General;  Laterality: Left;    REVIEW OF SYSTEMS:  General: fatigue (+), night sweats (-), fever (-), pain (-) Lymph: palpable nodes (-) HEENT: vision changes (-), mucositis (-), gum bleeding (-), epistaxis (-) Cardiovascular: chest pain (-), palpitations (-) Pulmonary: shortness of breath (-), dyspnea on exertion (-), cough (-), hemoptysis (-) GI:  Early satiety (-), melena (-), dysphagia (-),  nausea/vomiting (-), diarrhea (-) GU: dysuria (-), hematuria (-), incontinence (-) Musculoskeletal: joint swelling (-), joint pain (-), back pain (-) Neuro: weakness (-), numbness (+), headache (-), confusion (-) Skin: Rash (-), lesions (-), dryness (-) Psych: depression (-), suicidal/homicidal ideation (-), feeling of hopelessness (-)    PHYSICAL EXAMINATION: Blood pressure 119/70, pulse 98, temperature 98.3 F (36.8 C), temperature source Oral, resp. rate 20, height 5\' 4"  (1.626 m), weight 142 lb 3.2 oz (64.501 kg). Body mass index is 24.4 kg/(m^2). General: Patient is a well appearing female in no acute distress HEENT: PERRLA, sclerae anicteric no conjunctival pallor, MMM Neck: supple, no palpable adenopathy Lungs: clear to auscultation bilaterally, no wheezes, rhonchi, or rales Cardiovascular: regular rate rhythm, S1, S2, no murmurs, rubs or gallops Abdomen: Soft, non-tender, non-distended, normoactive bowel sounds, no HSM Extremities: warm and well perfused, no clubbing, cyanosis, or edema Skin: No rashes or lesions, pustular rash on scalp Neuro: Non-focal Breasts: The right breast is status post mastectomy. There is no evidence of local recurrence. The right axilla is benign. The left breast is unremarkable. ECOG PERFORMANCE STATUS: 1   LABORATORY DATA: Lab Results  Component Value Date   WBC 5.0 09/20/2012   HGB 8.9* 09/20/2012   HCT 27.2* 09/20/2012   MCV 100.4 09/20/2012   PLT 57* 09/20/2012      Chemistry      Component Value  Date/Time   NA 131* 09/13/2012 1021   NA 138 05/11/2012 0640   K 3.7 09/13/2012 1021   K 4.7 05/11/2012 0640   CL 96* 09/13/2012 1021   CL 99 05/11/2012 0640   CO2 25 09/13/2012 1021   CO2 20 05/11/2012 0640   BUN 15.6 09/13/2012 1021   BUN 12 05/11/2012 0640   CREATININE 0.8 09/13/2012 1021   CREATININE 0.88 05/11/2012 0640      Component Value Date/Time   CALCIUM 8.8 09/13/2012 1021   CALCIUM 9.7 05/11/2012 0640   ALKPHOS 87 09/13/2012 1021   AST 17 09/13/2012 1021   ALT 19 09/13/2012 1021   BILITOT 0.86 09/13/2012 1021     ADDITIONAL INFORMATION: CHROMOGENIC IN-SITU HYBRIDIZATION (1C - TUMOR WITH CLIP) Interpretation HER-2/NEU BY CISH - NO AMPLIFICATION OF HER-2 DETECTED. THE RATIO OF HER-2: CEP 17 SIGNALS WAS 1.25. Reference range: Ratio: HER2:CEP17 < 1.8 - gene amplification not observed Ratio: HER2:CEP 17 1.8-2.2 - equivocal result Ratio: HER2:CEP17 > 2.2 - gene amplification observed CHROMOGENIC IN-SITU HYBRIDIZATION (1E - TUMOR WITHOUT CLIP) Interpretation: HER2/NEU BY CISH - SHOWS AMPLIFICATION BY CISH ANALYSIS. THE RATIO OF HER2: CEP 17 SIGNALS WAS 2.83. Reference range: Ratio: HER2:CEP17 < 1.8 gene amplification not observed Ratio: HER2:CEP 17 1.8-2.2 - equivocal result Ratio: HER2:CEP17 > 2.2 - gene amplification observed Pecola Leisure MD Pathologist, Electronic Signature ( Signed 05/15/2012) FINAL DIAGNOSIS Diagnosis Breast, modified radical mastectomy , right, with axillary contents 1 of 4 FINAL for ORCHID, GLASSBERG S (SZA14-4) Diagnosis(continued) TUMOR #1 (WITH CLIP): - INVASIVE LOBULAR CARCINOMA, GRADE II, SEE COMMENT. - LYMPHOVASCULAR INVASION IDENTIFIED. - PREVIOUS BIOPSY SITE IDENTIFIED. TUMOR #2 (NO CLIP): - INVASIVE LOBULAR CARCINOMA, GRADE II, SEE COMMENT. - LOBULAR CARCINOMA IN SITU. - LYMPHOVASCULAR INVASION IDENTIFIED. - THREE LYMPH NODES, POSITIVE FOR METASTATIC MAMMARY CARCINOMA (3/7). - TUMOR DEPOSITS ARE 1.7, 1.6, AND 5 CM. - EXTRACAPSULAR TUMOR EXTENSION IDENTIFIED. -  TWO LYMPH NODES, POSITIVE FOR ISOLATED TUMOR CELLS. Microscopic Comment BREAST, INVASIVE TUMOR, WITH LYMPH NODE SAMPLING Specimen, including laterality: Right breast Procedure: Modified radical mastectomy Grade: Tumor 1 and tumor 2: Grade II  of III Tubule formation: 3 Nuclear pleomorphism: 2 Mitotic: 1 Tumor size (gross measurement: Tumor #1- 2.3 cm; tumor #2 - 2.4 cm Margins: Invasive, distance to closest margin: Tumor #1 2.7 cm - deep margin; tumor #2 - 1.6 cm - deep margin Lymphovascular invasion: Present Ductal carcinoma in situ: Absent Grade: N/A Extensive intraductal component: N/A Lobular neoplasia: Both atypical hyperplasia and in situ carcinoma Extent of tumor: Skin: Grossly negative Nipple: Grossly negative Skeletal muscle: Microscopically negative Lymph nodes: # examined: 7 Lymph nodes with metastasis: 3 Isolated tumor cells (< 0.2 mm): two lymph nodes (Slide 1P and 1O) Micrometastasis: (> 0.2 mm and < 2.0 mm): none Macrometastasis: (> 2.0 mm): 1.7 cm , 1.6 cm and 5 cm Extracapsular extension: Present Breast prognostic profile: Tumor #1: Estrogen receptor: Not repeated, previous study demonstrated 98% positivity ([id number]) Progesterone receptor: Not repeated, previous study demonstrated 0% positivity ([id number]) Her 2 neu: Repeated, previous study demonstrated no amplification (1.38) ([id number]) Ki-67: Not repeated, previous study demonstrated 5% proliferation rate ([id number]) Tumor #2: Estrogen receptor: Not repeated, previous study demonstrated 97% positivity ([id number]) Progesterone receptor: Not repeated, previous study demonstrated 0% positivity ([id number]) Her 2 neu: Repeated, previous study demonstrated amplification (3.65) ([id number]) Ki-67: Not repeated, previous study demonstrated 44% proliferation rate ([id number]) 2 of 4 FINAL for DARIAH, MCSORLEY (SZA14-4) Microscopic Comment(continued) Non-neoplastic breast: Benign fibrocystic change  with usual ductal hyperplasia, sclerosing adenosis, benign radial sclerosing lesion, and microcalcifications in benign ducts and lobules. TNM: mpT2, pN1a, pMX. Comments: none  RADIOGRAPHIC STUDIES:  Mr Lodema Pilot Contrast  03/23/2012  *RADIOLOGY REPORT*  Clinical Data: New diagnosis breast cancer.  Rule out metastatic disease.  MRI HEAD WITHOUT AND WITH CONTRAST  Technique:  Multiplanar, multiecho pulse sequences of the brain and surrounding structures were obtained according to standard protocol without and with intravenous contrast  Contrast: 12mL MULTIHANCE GADOBENATE DIMEGLUMINE 529 MG/ML IV SOLN  Comparison: None.  Findings: Ventricle size is normal.  Small 4 mm hyperintensities in the cerebral white matter bilaterally do not enhance and are most likely due to chronic microvascular ischemia.  Negative for acute infarct.  Diffusion weighted imaging is normal.  Pituitary is normal in size.  Negative for intracranial hemorrhage.  Following contrast infusion, no enhancing lesions are identified.  Paranasal sinuses are clear.  Vessels at the base of the brain are patent.  IMPRESSION: Negative for metastatic disease.  No acute abnormality.   Original Report Authenticated By: Janeece Riggers, M.D.    Mr Breast Bilateral W Wo Contrast  03/12/2012  *RADIOLOGY REPORT*  Clinical Data: Recently diagnosed invasive mammary carcinoma in the 10 o'clock region of the right breast with a metastatic right axillary lymph node.  BILATERAL BREAST MRI WITH AND WITHOUT CONTRAST  Technique: Multiplanar, multisequence MR images of both breasts were obtained prior to and following the intravenous administration of 12ml of multihance.  Three dimensional images were evaluated at the independent DynaCad workstation.  Comparison:  Mammograms dated 03/05/2012, 03/01/2012, 03/01/2011 and 02/26/2010 from [facility]  Findings: There is a moderate background parenchymal enhancement pattern.  Right breast: 1.  There is a  lobulated 2.2 x 1.7 x 1.8 cm mass in the posterior third of the upper outer quadrant of the right breast.  There is a post biopsy clip artifact 1 cm lateral to the mass. 2.  In the anterior third upper outer quadrant of the right breast there is patchy non mass like enhancement measuring 0.8 x 0.3 x 0.3 cm.  Left breast:  There is no  abnormal enhancement the left breast.  There are enlarged right axillary lymph nodes.  The largest right axillary lymph node measures 2.4 cm and is located lateral to the pectoralis minor consistent with level I adenopathy.  There is a 1.6 cm lymph node behind the pectoralis minor consistent with enlarged level II adenopathy.  IMPRESSION:  1.  2.2 cm mass in the posterior third of the upper outer quadrant of the right breast corresponding with the known invasive mammary carcinoma. 2.  Patchy non mass-like enhancement in the anterior third of the upper outer quadrant of the right breast.  MR guided core biopsy is recommended. 3. Enlarged level I and II right axillary adenopathy.  RECOMMENDATION: MR guided core biopsy of the right breast.  THREE-DIMENSIONAL MR IMAGE RENDERING ON INDEPENDENT WORKSTATION:  Three-dimensional MR images were rendered by post-processing of the original MR data on an independent workstation.  The three- dimensional MR images were interpreted, and findings were reported in the accompanying complete MRI report for this study.  BI-RADS CATEGORY 4:  Suspicious abnormality - biopsy should be considered.   Original Report Authenticated By: Baird Lyons, M.D.    Mr Biopsy/wire Localization  03/20/2012  *RADIOLOGY REPORT*  Clinical Data:  Known right breast carcinoma.  Additional area of worrisome enhancement located within the anterior upper outer quadrant of the right breast.  MRI GUIDED VACUUM ASSISTED BIOPSY OF THE RIGHT BREAST WITHOUT AND WITH CONTRAST  Comparison: Previous exams.  Technique: Multiplanar, multisequence MR images of the right breast were obtained  prior to and following the intravenous administration of 12 ml of Mulithance.  I met with the patient, and we discussed the procedure of MRI guided biopsy, including risks, benefits, and alternatives. Specifically, we discussed the risks of infection, bleeding, tissue injury, clip migration, and inadequate sampling.  Informed, written consent was given.  Using sterile technique, 2% Lidocaine, MRI guidance, and a 9 gauge vacuum assisted device, biopsy was performed of the area of enhancement located within the anterior portion of the upper-outer quadrant - right breast using a lateral approach.  At the conclusion of the procedure, a  tissue marker clip was deployed into the biopsy cavity.  IMPRESSION: MRI guided biopsy of the area of enhancement located within the anterior portion of the upper-outer quadrant of the right breast as discussed above. No apparent complications.  BI-RADS CATEGORY 6:  Known biopsy-proven malignancy - appropriate action should be taken.  THREE-DIMENSIONAL MR IMAGE RENDERING ON INDEPENDENT WORKSTATION:  Three-dimensional MR images were rendered by post-processing of the original MR data on an independent workstation.  The three- dimensional MR images were interpreted, and findings were reported in the accompanying complete MRI report for this study.  The pathology associated with the right breast MR guided core biopsy demonstrated invasive mammary carcinoma and in situ mammary carcinoma.  This is concordant with imaging findings.  I have discussed findings with the patient by telephone and answered her questions.  The patient states that biopsy site is clean and dry without hematoma formation or signs of infection.  Post biopsy wound care instructions were reviewed with the patient.  Follow-up will be with Dr. Carolynne Edouard and Dr. Welton Flakes regards her treatment plan.  The patient was encouraged to call the Breast Center for additional questions or concerns   Original Report Authenticated By: Rolla Plate, M.D.    Nm Pet Image Initial (pi) Skull Base To Thigh  03/22/2012  *RADIOLOGY REPORT*  Clinical Data: Initial treatment strategy for high risk right breast cancer.  NUCLEAR MEDICINE PET SKULL BASE TO THIGH  Fasting Blood Glucose:  100  Technique:  18.6 mCi F-18 FDG was injected intravenously. CT data was obtained and used for attenuation correction and anatomic localization only.  (This was not acquired as a diagnostic CT examination.) Additional exam technical data entered on technologist worksheet.  Comparison:  MRI breast dated 03/12/2012  Findings:  Neck: No hypermetabolic lymph nodes in the neck.  Chest:  Vague hypermetabolism in the upper/central right breast, max SUV 2.7.  Additional focal hypermetabolism in the upper/outer right breast, max SUV 2.3 (PET image 105).  These findings correspond to biopsy-proven primary breast carcinoma.  1.3 cm short axis right subpectoral lymph node (series 2/image 66), max SUV 3.1.  Right axillary lymphadenopathy measuring up to 1.6 cm short axis (series 2/image 88), max SUV 4.8).  No hypermetabolic mediastinal or hilar nodes.  No suspicious pulmonary nodules on the CT scan.  Abdomen/Pelvis:  No abnormal hypermetabolic activity within the liver, pancreas, adrenal glands, or spleen.  No hypermetabolic lymph nodes in the abdomen or pelvis.  Skeleton:  No focal hypermetabolic activity to suggest skeletal metastasis.  IMPRESSION: Two foci of hypermetabolism in the upper right breast, max SUV 2.7, corresponding to biopsy-proven primary breast carcinoma.  Associated right subpectoral and axillary nodal metastases, max SUV 4.8.  No evidence of distant metastases.   Original Report Authenticated By: Charline Bills, M.D.    Mm Digital Diagnostic Unilat R  03/20/2012  *RADIOLOGY REPORT*  Clinical Data:  Post right breast MR guided core biopsy.  DIGITAL DIAGNOSTIC RIGHT BREAST MAMMOGRAM  Comparison:  03/05/2012, 03/01/2012, 03/01/2011, 02/26/2010 mammograms from [facility]  imaging.  Findings:  Films are performed following MR guided biopsy of the anterior portion of the upper-outer quadrant of the right breast. The hourglass shaped clip placed during the MR guided core biopsy appears in appropriate position.  IMPRESSION: Appropriate positioning of clip following right breast MR guided core biopsy.   Original Report Authenticated By: Rolla Plate, M.D.     ASSESSMENT: 67 year old [place], [place] woman  #1  status post mastectomy with right axillary lymph node dissection 05/10/2012 for an mpT2 N1, stage stage IIB invasive lobular breast cancer, stage IIB, ER positive, HER-2/neu positive, with an Mib-1 between 5% and 44%  #2 receiving adjuvant chemotherapy consisting of Taxotere, carboplatinum and Herceptin given every 21 days for total of 6 cycles of Taxotere carboplatinum and weekly Herceptin beginning on 06/14/12.  #3 Herceptin to be continued to total one year; most recent echo 09/17/12.  PLAN:   1. Ms. Pommier is doing well. She will proceed with Herceptin.   2. She will continue to take Super B complex daily. I will add Neurontin TID today.     3. Ms. Pescador will return next week for labs, appt, chemotherapy.     All questions were answered. The patient knows to call the clinic with any problems, questions or concerns. We can certainly see the patient much sooner if necessary.   I spent 25 minutes counseling the patient face to face. The total time spent in the appointment was 30 minutes.  Cherie Ouch Lyn Hollingshead, NP Medical Oncology [facility] Phone: [phone] 09/20/2012, 9:24 AM

## 2012-09-20 NOTE — Patient Instructions (Signed)
Doing well.  Proceed with Herceptin.  Start taking Neurontin three times a day.    Gabapentin capsules or tablets What is this medicine? GABAPENTIN (GA ba pen tin) is used to control partial seizures in adults with epilepsy. It is also used to treat certain types of nerve pain. This medicine may be used for other purposes; ask your health care provider or pharmacist if you have questions. What should I tell my health care provider before I take this medicine? They need to know if you have any of these conditions: -kidney disease -suicidal thoughts, plans, or attempt; a previous suicide attempt by you or a family member -an unusual or allergic reaction to gabapentin, other medicines, foods, dyes, or preservatives -pregnant or trying to get pregnant -breast-feeding How should I use this medicine? Take this medicine by mouth. Swallow it with a drink of water. Follow the directions on the prescription label. If this medicine upsets your stomach, take it with food or milk. Take your medicine at regular intervals. Do not take it more often than directed. If you are directed to break the 600 or 800 mg tablets in half as part of your dose, the extra half tablet should be used for the next dose. If you have not used the extra half tablet within 3 days, it should be thrown away. A special MedGuide will be given to you by the pharmacist with each prescription and refill. Be sure to read this information carefully each time. Talk to your pediatrician regarding the use of this medicine in children. Special care may be needed. Overdosage: If you think you have taken too much of this medicine contact a poison control center or emergency room at once. NOTE: This medicine is only for you. Do not share this medicine with others. What if I miss a dose? If you miss a dose, take it as soon as you can. If it is almost time for your next dose, take only that dose. Do not take double or extra doses. What may interact  with this medicine? -antacids -hydrocodone -morphine -naproxen -sevelamer This list may not describe all possible interactions. Give your health care provider a list of all the medicines, herbs, non-prescription drugs, or dietary supplements you use. Also tell them if you smoke, drink alcohol, or use illegal drugs. Some items may interact with your medicine. What should I watch for while using this medicine? Visit your doctor or health care professional for regular checks on your progress. You may want to keep a record at home of how you feel your condition is responding to treatment. You may want to share this information with your doctor or health care professional at each visit. You should contact your doctor or health care professional if your seizures get worse or if you have any new types of seizures. Do not stop taking this medicine or any of your seizure medicines unless instructed by your doctor or health care professional. Stopping your medicine suddenly can increase your seizures or their severity. Wear a medical identification bracelet or chain if you are taking this medicine for seizures, and carry a card that lists all your medications. You may get drowsy, dizzy, or have blurred vision. Do not drive, use machinery, or do anything that needs mental alertness until you know how this medicine affects you. To reduce dizzy or fainting spells, do not sit or stand up quickly, especially if you are an older patient. Alcohol can increase drowsiness and dizziness. Avoid alcoholic drinks. Your mouth  may get dry. Chewing sugarless gum or sucking hard candy, and drinking plenty of water will help. The use of this medicine may increase the chance of suicidal thoughts or actions. Pay special attention to how you are responding while on this medicine. Any worsening of mood, or thoughts of suicide or dying should be reported to your health care professional right away. Women who become pregnant while using  this medicine may enroll in the [place] [employer] Antiepileptic Drug Pregnancy Registry by calling [phone]. This registry collects information about the safety of antiepileptic drug use during pregnancy. What side effects may I notice from receiving this medicine? Side effects that you should report to your doctor or health care professional as soon as possible: -allergic reactions like skin rash, itching or hives, swelling of the face, lips, or tongue -worsening of mood, thoughts or actions of suicide or dying Side effects that usually do not require medical attention (report to your doctor or health care professional if they continue or are bothersome): -constipation -difficulty walking or controlling muscle movements -nausea -slurred speech -tremors -weight gain This list may not describe all possible side effects. Call your doctor for medical advice about side effects. You may report side effects to FDA at 1-800-FDA-1088. Where should I keep my medicine? Keep out of reach of children. Store at room temperature between 15 and 30 degrees C (59 and 86 degrees F). Throw away any unused medicine after the expiration date. NOTE: This sheet is a summary. It may not cover all possible information. If you have questions about this medicine, talk to your doctor, pharmacist, or health care provider.  2012, [employer]. (12/22/2009 6:06:26 PM)

## 2012-09-25 ENCOUNTER — Encounter (INDEPENDENT_AMBULATORY_CARE_PROVIDER_SITE_OTHER): Payer: Self-pay | Admitting: General Surgery

## 2012-09-25 ENCOUNTER — Ambulatory Visit (INDEPENDENT_AMBULATORY_CARE_PROVIDER_SITE_OTHER): Payer: Medicare Other | Admitting: General Surgery

## 2012-09-25 VITALS — BP 126/82 | HR 84 | Resp 12 | Ht 64.0 in | Wt 144.0 lb

## 2012-09-25 DIAGNOSIS — C50411 Malignant neoplasm of upper-outer quadrant of right female breast: Secondary | ICD-10-CM

## 2012-09-25 DIAGNOSIS — C50419 Malignant neoplasm of upper-outer quadrant of unspecified female breast: Secondary | ICD-10-CM

## 2012-09-25 NOTE — Progress Notes (Signed)
Subjective:     Patient ID: Heather [name], female   DOB: [birth year], 67 y.o.   MRN: [id number]  HPI The patient is a 67 year old white female who is 5 months status post right modified radical mastectomy for a T2 N1 right breast cancer. She has one more chemotherapy treatment left and she will be finished. The chemotherapy has caused her to not feel well and be very tired. She is scheduled start radiation therapy on June 19. She has experienced some mild swelling in the right arm. She does have a compression sleeve but wearing it causes her hand to swell. She was ER positive and HER-2 positive  Review of Systems  Constitutional: Positive for fatigue.  HENT: Negative.   Eyes: Negative.   Respiratory: Negative.   Cardiovascular: Negative.   Gastrointestinal: Negative.   Endocrine: Negative.   Genitourinary: Negative.   Musculoskeletal: Negative.   Skin: Negative.   Allergic/Immunologic: Negative.   Neurological: Negative.   Hematological: Negative.   Psychiatric/Behavioral: Negative.        Objective:   Physical Exam  Constitutional: She is oriented to person, place, and time. She appears well-developed and well-nourished.  HENT:  Head: Normocephalic and atraumatic.  Eyes: Conjunctivae and EOM are normal. Pupils are equal, round, and reactive to light.  Neck: Normal range of motion. Neck supple.  Cardiovascular: Normal rate, regular rhythm and normal heart sounds.   Pulmonary/Chest: Effort normal and breath sounds normal.  There is no palpable mass of the right chest wall. There is no palpable mass of the left breast. There is no palpable axillary supraclavicular cervical lymphadenopathy. Her mastectomy incision has healed very nicely.  Abdominal: Soft. Bowel sounds are normal. She exhibits no mass. There is no tenderness.  Musculoskeletal: Normal range of motion.  There is mild swelling of the right arm.  Lymphadenopathy:    She has no cervical adenopathy.  Neurological: She is  alert and oriented to person, place, and time.  Skin: Skin is warm and dry.  Psychiatric: She has a normal mood and affect. Her behavior is normal.       Assessment:     The patient is 5 months status post right modified radical mastectomy for breast cancer     Plan:     At this point I would recommend that she return to physical therapy to work on strength exercises and to see her compression sleeve needs to be adjusted. She will continue to follow up medical and radiation therapy. We will plan to see her back in about 6 months.

## 2012-09-25 NOTE — Patient Instructions (Signed)
Return to physical therapy for strength exercises and adjust compression sleeve if needed

## 2012-09-26 ENCOUNTER — Other Ambulatory Visit: Payer: Self-pay | Admitting: Emergency Medicine

## 2012-09-26 DIAGNOSIS — C50419 Malignant neoplasm of upper-outer quadrant of unspecified female breast: Secondary | ICD-10-CM

## 2012-09-27 ENCOUNTER — Ambulatory Visit (HOSPITAL_BASED_OUTPATIENT_CLINIC_OR_DEPARTMENT_OTHER): Payer: Medicare Other | Admitting: Oncology

## 2012-09-27 ENCOUNTER — Other Ambulatory Visit (HOSPITAL_BASED_OUTPATIENT_CLINIC_OR_DEPARTMENT_OTHER): Payer: Medicare Other | Admitting: Lab

## 2012-09-27 ENCOUNTER — Encounter: Payer: Self-pay | Admitting: Oncology

## 2012-09-27 ENCOUNTER — Ambulatory Visit (HOSPITAL_BASED_OUTPATIENT_CLINIC_OR_DEPARTMENT_OTHER): Payer: Medicare Other

## 2012-09-27 VITALS — BP 142/73 | HR 89 | Temp 98.0°F | Resp 20 | Ht 64.0 in | Wt 143.6 lb

## 2012-09-27 DIAGNOSIS — C50419 Malignant neoplasm of upper-outer quadrant of unspecified female breast: Secondary | ICD-10-CM

## 2012-09-27 DIAGNOSIS — Z170 Estrogen receptor positive status [ER+]: Secondary | ICD-10-CM

## 2012-09-27 DIAGNOSIS — Z5112 Encounter for antineoplastic immunotherapy: Secondary | ICD-10-CM

## 2012-09-27 DIAGNOSIS — C50411 Malignant neoplasm of upper-outer quadrant of right female breast: Secondary | ICD-10-CM

## 2012-09-27 DIAGNOSIS — Z5111 Encounter for antineoplastic chemotherapy: Secondary | ICD-10-CM

## 2012-09-27 LAB — CBC WITH DIFFERENTIAL/PLATELET
BASO%: 0 % (ref 0.0–2.0)
Basophils Absolute: 0 10*3/uL (ref 0.0–0.1)
EOS%: 0 % (ref 0.0–7.0)
Eosinophils Absolute: 0 10*3/uL (ref 0.0–0.5)
HCT: 28.4 % — ABNORMAL LOW (ref 34.8–46.6)
HGB: 9.2 g/dL — ABNORMAL LOW (ref 11.6–15.9)
LYMPH%: 8.8 % — ABNORMAL LOW (ref 14.0–49.7)
MCH: 32.9 pg (ref 25.1–34.0)
MCHC: 32.4 g/dL (ref 31.5–36.0)
MCV: 101.4 fL — ABNORMAL HIGH (ref 79.5–101.0)
MONO#: 1.1 10*3/uL — ABNORMAL HIGH (ref 0.1–0.9)
MONO%: 11.6 % (ref 0.0–14.0)
NEUT#: 7.6 10*3/uL — ABNORMAL HIGH (ref 1.5–6.5)
NEUT%: 79.6 % — ABNORMAL HIGH (ref 38.4–76.8)
Platelets: 205 10*3/uL (ref 145–400)
RBC: 2.8 10*6/uL — ABNORMAL LOW (ref 3.70–5.45)
RDW: 19.5 % — ABNORMAL HIGH (ref 11.2–14.5)
WBC: 9.5 10*3/uL (ref 3.9–10.3)
lymph#: 0.8 10*3/uL — ABNORMAL LOW (ref 0.9–3.3)
nRBC: 0 % (ref 0–0)

## 2012-09-27 LAB — COMPREHENSIVE METABOLIC PANEL (CC13)
ALT: 23 U/L (ref 0–55)
AST: 22 U/L (ref 5–34)
Albumin: 3.8 g/dL (ref 3.5–5.0)
Alkaline Phosphatase: 82 U/L (ref 40–150)
BUN: 17.7 mg/dL (ref 7.0–26.0)
CO2: 21 mEq/L — ABNORMAL LOW (ref 22–29)
Calcium: 9.3 mg/dL (ref 8.4–10.4)
Chloride: 100 mEq/L (ref 98–107)
Creatinine: 0.9 mg/dL (ref 0.6–1.1)
Glucose: 140 mg/dl — ABNORMAL HIGH (ref 70–99)
Potassium: 3.7 mEq/L (ref 3.5–5.1)
Sodium: 134 mEq/L — ABNORMAL LOW (ref 136–145)
Total Bilirubin: 0.38 mg/dL (ref 0.20–1.20)
Total Protein: 6.8 g/dL (ref 6.4–8.3)

## 2012-09-27 MED ORDER — SODIUM CHLORIDE 0.9 % IJ SOLN
10.0000 mL | INTRAMUSCULAR | Status: DC | PRN
  Administered 2012-09-27: 10 mL
  Filled 2012-09-27: qty 10

## 2012-09-27 MED ORDER — TRASTUZUMAB CHEMO INJECTION 440 MG
2.0000 mg/kg | Freq: Once | INTRAVENOUS | Status: AC
  Administered 2012-09-27: 126 mg via INTRAVENOUS
  Filled 2012-09-27: qty 6

## 2012-09-27 MED ORDER — DEXAMETHASONE SODIUM PHOSPHATE 20 MG/5ML IJ SOLN
20.0000 mg | Freq: Once | INTRAMUSCULAR | Status: AC
  Administered 2012-09-27: 20 mg via INTRAVENOUS

## 2012-09-27 MED ORDER — ONDANSETRON 16 MG/50ML IVPB (CHCC)
16.0000 mg | Freq: Once | INTRAVENOUS | Status: AC
  Administered 2012-09-27: 16 mg via INTRAVENOUS

## 2012-09-27 MED ORDER — DIPHENHYDRAMINE HCL 25 MG PO CAPS
25.0000 mg | ORAL_CAPSULE | Freq: Once | ORAL | Status: AC
  Administered 2012-09-27: 25 mg via ORAL

## 2012-09-27 MED ORDER — HEPARIN SOD (PORK) LOCK FLUSH 100 UNIT/ML IV SOLN
500.0000 [IU] | Freq: Once | INTRAVENOUS | Status: AC | PRN
  Administered 2012-09-27: 500 [IU]
  Filled 2012-09-27: qty 5

## 2012-09-27 MED ORDER — ACETAMINOPHEN 325 MG PO TABS
650.0000 mg | ORAL_TABLET | Freq: Once | ORAL | Status: AC
  Administered 2012-09-27: 650 mg via ORAL

## 2012-09-27 MED ORDER — SODIUM CHLORIDE 0.9 % IV SOLN
500.0000 mg | Freq: Once | INTRAVENOUS | Status: AC
  Administered 2012-09-27: 500 mg via INTRAVENOUS
  Filled 2012-09-27: qty 50

## 2012-09-27 MED ORDER — SODIUM CHLORIDE 0.9 % IV SOLN
75.0000 mg/m2 | Freq: Once | INTRAVENOUS | Status: AC
  Administered 2012-09-27: 120 mg via INTRAVENOUS
  Filled 2012-09-27: qty 12

## 2012-09-27 MED ORDER — SODIUM CHLORIDE 0.9 % IV SOLN
Freq: Once | INTRAVENOUS | Status: AC
  Administered 2012-09-27: 13:00:00 via INTRAVENOUS

## 2012-09-27 NOTE — Patient Instructions (Addendum)
[facility] Discharge Instructions for Patients Receiving Chemotherapy  Today you received the following chemotherapy agents Taxotere, carboplatin and herceptin.   If you develop nausea and vomiting that is not controlled by your nausea medication, call the clinic. If it is after clinic hours your family physician or the after hours number for the clinic or go to the Emergency Department.   BELOW ARE SYMPTOMS THAT SHOULD BE REPORTED IMMEDIATELY:  *FEVER GREATER THAN 100.5 F  *CHILLS WITH OR WITHOUT FEVER  NAUSEA AND VOMITING THAT IS NOT CONTROLLED WITH YOUR NAUSEA MEDICATION  *UNUSUAL SHORTNESS OF BREATH  *UNUSUAL BRUISING OR BLEEDING  TENDERNESS IN MOUTH AND THROAT WITH OR WITHOUT PRESENCE OF ULCERS  *URINARY PROBLEMS  *BOWEL PROBLEMS  UNUSUAL RASH Items with * indicate a potential emergency and should be followed up as soon as possible.  One of the nurses will contact you 24 hours after your treatment. Please let the nurse know about any problems that you may have experienced. Feel free to call the clinic you have any questions or concerns. The clinic phone number is [phone].   I have been informed and understand all the instructions given to me. I know to contact the clinic, my physician, or go to the Emergency Department if any problems should occur. I do not have any questions at this time, but understand that I may call the clinic during office hours   should I have any questions or need assistance in obtaining follow up care.    __________________________________________  _____________  __________ Signature of Patient or Authorized Representative            Date                   Time    __________________________________________ Nurse's Signature

## 2012-09-28 ENCOUNTER — Ambulatory Visit (HOSPITAL_BASED_OUTPATIENT_CLINIC_OR_DEPARTMENT_OTHER): Payer: Medicare Other

## 2012-09-28 VITALS — BP 140/74 | HR 77 | Temp 99.0°F

## 2012-09-28 DIAGNOSIS — C50419 Malignant neoplasm of upper-outer quadrant of unspecified female breast: Secondary | ICD-10-CM

## 2012-09-28 MED ORDER — PEGFILGRASTIM INJECTION 6 MG/0.6ML
6.0000 mg | Freq: Once | SUBCUTANEOUS | Status: AC
  Administered 2012-09-28: 6 mg via SUBCUTANEOUS
  Filled 2012-09-28: qty 0.6

## 2012-10-03 ENCOUNTER — Other Ambulatory Visit: Payer: Self-pay | Admitting: Emergency Medicine

## 2012-10-03 DIAGNOSIS — C50419 Malignant neoplasm of upper-outer quadrant of unspecified female breast: Secondary | ICD-10-CM

## 2012-10-04 ENCOUNTER — Other Ambulatory Visit (HOSPITAL_BASED_OUTPATIENT_CLINIC_OR_DEPARTMENT_OTHER): Payer: Medicare Other | Admitting: Lab

## 2012-10-04 ENCOUNTER — Ambulatory Visit: Payer: Medicare Other | Admitting: Adult Health

## 2012-10-04 ENCOUNTER — Encounter: Payer: Self-pay | Admitting: Adult Health

## 2012-10-04 ENCOUNTER — Ambulatory Visit (HOSPITAL_BASED_OUTPATIENT_CLINIC_OR_DEPARTMENT_OTHER): Payer: Medicare Other | Admitting: Adult Health

## 2012-10-04 ENCOUNTER — Ambulatory Visit (HOSPITAL_BASED_OUTPATIENT_CLINIC_OR_DEPARTMENT_OTHER): Payer: Medicare Other

## 2012-10-04 VITALS — BP 133/82 | HR 97 | Temp 98.1°F | Resp 18 | Ht 64.0 in | Wt 138.4 lb

## 2012-10-04 DIAGNOSIS — C50419 Malignant neoplasm of upper-outer quadrant of unspecified female breast: Secondary | ICD-10-CM | POA: Insufficient documentation

## 2012-10-04 DIAGNOSIS — D709 Neutropenia, unspecified: Secondary | ICD-10-CM

## 2012-10-04 DIAGNOSIS — C773 Secondary and unspecified malignant neoplasm of axilla and upper limb lymph nodes: Secondary | ICD-10-CM

## 2012-10-04 DIAGNOSIS — Z170 Estrogen receptor positive status [ER+]: Secondary | ICD-10-CM

## 2012-10-04 DIAGNOSIS — C50411 Malignant neoplasm of upper-outer quadrant of right female breast: Secondary | ICD-10-CM

## 2012-10-04 DIAGNOSIS — Z5112 Encounter for antineoplastic immunotherapy: Secondary | ICD-10-CM

## 2012-10-04 LAB — CBC WITH DIFFERENTIAL/PLATELET
BASO%: 0.2 % (ref 0.0–2.0)
Basophils Absolute: 0 10*3/uL (ref 0.0–0.1)
EOS%: 0.1 % (ref 0.0–7.0)
Eosinophils Absolute: 0 10*3/uL (ref 0.0–0.5)
HCT: 31.7 % — ABNORMAL LOW (ref 34.8–46.6)
HGB: 10.8 g/dL — ABNORMAL LOW (ref 11.6–15.9)
LYMPH%: 41.5 % (ref 14.0–49.7)
MCH: 34.6 pg — ABNORMAL HIGH (ref 25.1–34.0)
MCHC: 34 g/dL (ref 31.5–36.0)
MCV: 101.9 fL — ABNORMAL HIGH (ref 79.5–101.0)
MONO#: 0.8 10*3/uL (ref 0.1–0.9)
MONO%: 39.2 % — ABNORMAL HIGH (ref 0.0–14.0)
NEUT#: 0.4 10*3/uL — CL (ref 1.5–6.5)
NEUT%: 19 % — ABNORMAL LOW (ref 38.4–76.8)
Platelets: 156 10*3/uL (ref 145–400)
RBC: 3.11 10*6/uL — ABNORMAL LOW (ref 3.70–5.45)
RDW: 16.8 % — ABNORMAL HIGH (ref 11.2–14.5)
WBC: 2 10*3/uL — ABNORMAL LOW (ref 3.9–10.3)
lymph#: 0.8 10*3/uL — ABNORMAL LOW (ref 0.9–3.3)

## 2012-10-04 LAB — COMPREHENSIVE METABOLIC PANEL (CC13)
ALT: 20 U/L (ref 0–55)
AST: 19 U/L (ref 5–34)
Albumin: 3.8 g/dL (ref 3.5–5.0)
Alkaline Phosphatase: 86 U/L (ref 40–150)
BUN: 13.2 mg/dL (ref 7.0–26.0)
CO2: 25 mEq/L (ref 22–29)
Calcium: 8.6 mg/dL (ref 8.4–10.4)
Chloride: 99 mEq/L (ref 98–107)
Creatinine: 0.8 mg/dL (ref 0.6–1.1)
Glucose: 99 mg/dl (ref 70–99)
Potassium: 3.5 mEq/L (ref 3.5–5.1)
Sodium: 133 mEq/L — ABNORMAL LOW (ref 136–145)
Total Bilirubin: 0.62 mg/dL (ref 0.20–1.20)
Total Protein: 6.4 g/dL (ref 6.4–8.3)

## 2012-10-04 MED ORDER — ACETAMINOPHEN 325 MG PO TABS
650.0000 mg | ORAL_TABLET | Freq: Once | ORAL | Status: AC
  Administered 2012-10-04: 650 mg via ORAL

## 2012-10-04 MED ORDER — TRASTUZUMAB CHEMO INJECTION 440 MG: 2.0000 mg/kg | Freq: Once | INTRAVENOUS | Status: DC

## 2012-10-04 MED ORDER — SODIUM CHLORIDE 0.9 % IJ SOLN
10.0000 mL | INTRAMUSCULAR | Status: DC | PRN
  Filled 2012-10-04: qty 10

## 2012-10-04 MED ORDER — HEPARIN SOD (PORK) LOCK FLUSH 100 UNIT/ML IV SOLN
500.0000 [IU] | Freq: Once | INTRAVENOUS | Status: DC | PRN
  Filled 2012-10-04: qty 5

## 2012-10-04 MED ORDER — SODIUM CHLORIDE 0.9 % IJ SOLN
10.0000 mL | INTRAMUSCULAR | Status: DC | PRN
  Administered 2012-10-04: 10 mL
  Filled 2012-10-04: qty 10

## 2012-10-04 MED ORDER — SODIUM CHLORIDE 0.9 % IV SOLN
Freq: Once | INTRAVENOUS | Status: DC
  Administered 2012-10-04: 12:00:00 via INTRAVENOUS

## 2012-10-04 MED ORDER — HEPARIN SOD (PORK) LOCK FLUSH 100 UNIT/ML IV SOLN
500.0000 [IU] | Freq: Once | INTRAVENOUS | Status: AC | PRN
  Administered 2012-10-04: 500 [IU]
  Filled 2012-10-04: qty 5

## 2012-10-04 MED ORDER — DIPHENHYDRAMINE HCL 25 MG PO CAPS
25.0000 mg | ORAL_CAPSULE | Freq: Once | ORAL | Status: AC
  Administered 2012-10-04: 25 mg via ORAL

## 2012-10-04 MED ORDER — TRASTUZUMAB CHEMO INJECTION 440 MG
6.0000 mg/kg | Freq: Once | INTRAVENOUS | Status: AC
  Administered 2012-10-04: 378 mg via INTRAVENOUS
  Filled 2012-10-04: qty 18

## 2012-10-04 NOTE — Progress Notes (Signed)
OFFICE PROGRESS NOTE   Heather Bradford, MD [address] [place] [address] Dr. Chevis Pretty Dr. Chipper Herb  DIAGNOSIS: 67 year old female with invasive lobular breast cancer, stage IIB, receiving adjuvant chemotherapy  PRIOR THERAPY:  #1 patient was originally seen in the multidisciplinary breast clinic on 03/14/2012 for new diagnosis of invasive ductal carcinoma that was HER-2 positive node-positive, ER positive PR negative with Ki-67 of 44%  #2 on MRI patient also had an anterior breast mass that was to be biopsied to decide whether or not she should receive neoadjuvant chemotherapy or adjuvant chemotherapy. Patient'[name] mass was biopsied and it was positive for a malignancy that was HER-2/neu negative.  #3 patient is now status post right mastectomy with axillary lymph node dissection with the final pathology revealing the largest tumor measuring 5 cm, 1.7 cm and 1.6 cm. Tumor was ER positive PR negative HER-2/neu positive with a Ki-67 of 44% 3 of 7 lymph nodes were positive for metastatic disease.  #4 patient will proceed with adjuvant chemotherapy and Herceptin. Chemotherapy will consist of Taxotere carboplatinum given every 21 days with weekly Herceptin. Total of 6 cycles of Taxotere carboplatin will be administered starting on 06/14/2012. Once patient completes her chemotherapy then she will go on to receive radiation therapy along with Herceptin with the Herceptin then being converted to every 3 weeks to finish a total of one year. Because patient'[name] tumor is ER positive she will also be given antiestrogen therapy adjuvantly.  CURRENT THERAPY:Cycle 6 day 8 of TCH  INTERVAL HISTORY: Heather [name] is here for f/u prior to her adjuvant treatment of her right breast cancer.  She is feeling moderately well today.  She is neutropenic.  She denies fevers, vomiting, constipation, diarrhea.  She has had some mild nausea, fatigue, and numbness.  She was prescribed Gabapentin for the numbness,  but she didn't start it due to not wanting to take any more pills.  She is planning on going to PT for her right arm lymphedema.  She is otherwise well and a 10 point ROS is neg.   MEDICAL HISTORY: Past Medical History  Diagnosis Date  . Chronic kidney disease   . Hx: UTI (urinary tract infection)   . Right shoulder pain   . Right knee pain   . Hypertension     recently started Aldactone   . Hyperlipidemia     but not on meds;diet and exercise controlled  . Dizziness     has been going on for 70months;medical MD aware  . H/O hiatal hernia   . GERD (gastroesophageal reflux disease)     Tums prn  . Hemorrhoids   . Urinary frequency     d/t taking Aldactone  . History of UTI   . Anemia     hx of  . Cataract     immature-not sure of which eye  . Anxiety     r/t updated surgery  . Insomnia     takes Melatoniin daily  . Complication of anesthesia     pt states she is sensitive to meds  . Breast cancer     right  . Skin cancer     ALLERGIES:  is allergic to codeine; keflex; naprosyn; and tussin.  MEDICATIONS:  Current Outpatient Prescriptions  Medication Sig Dispense Refill  . B Complex-C (SUPER B COMPLEX PO) Take 1 each by mouth daily.      . calcium carbonate (TUMS - DOSED IN MG ELEMENTAL CALCIUM) 500 MG chewable tablet Chew 1  tablet by mouth daily.      . Cholecalciferol (VITAMIN D-3 PO) Take 4,000 Units by mouth daily.      . clindamycin (CLINDAGEL) 1 % gel Apply topically 2 (two) times daily.  30 g  0  . dexamethasone (DECADRON) 4 MG tablet Take 2 tablets (8 mg total) by mouth 2 (two) times daily with a meal. Take two times a day the day before Taxotere. Then take two times a day starting the day after chemo for 3 days.  36 tablet  5  . doxycycline (VIBRA-TABS) 100 MG tablet Take 1 tablet (100 mg total) by mouth 2 (two) times daily.  20 tablet  0  . gabapentin (NEURONTIN) 100 MG capsule Take 1 capsule (100 mg total) by mouth 3 (three) times daily.  90 capsule  2  .  lidocaine-prilocaine (EMLA) cream Apply topically as needed.  30 g  7  . Loratadine (CLARITIN PO) Take 1 each by mouth daily.      Marland Kitchen LORazepam (ATIVAN) 0.5 MG tablet Take 1 tablet (0.5 mg total) by mouth every 6 (six) hours as needed (Nausea or vomiting).  30 tablet  0  . magnesium gluconate (MAGONATE) 500 MG tablet Take 500 mg by mouth 2 (two) times daily.      Marland Kitchen omeprazole (PRILOSEC) 40 MG capsule Take 1 capsule (40 mg total) by mouth daily.  30 capsule  6  . ondansetron (ZOFRAN) 8 MG tablet Take 1 tablet (8 mg total) by mouth 2 (two) times daily. Take two times a day starting the day after chemo for 3 days. Then take two times a day as needed for nausea or vomiting.  30 tablet  1  . PREVIDENT 5000 BOOSTER PLUS 1.1 % PSTE Take 1 application by mouth Twice daily.      . Probiotic Product (PROBIOTIC DAILY PO) Take 1 each by mouth.      . prochlorperazine (COMPAZINE) 10 MG tablet Take 1 tablet (10 mg total) by mouth every 6 (six) hours as needed (Nausea or vomiting).  30 tablet  1  . prochlorperazine (COMPAZINE) 25 MG suppository Place 1 suppository (25 mg total) rectally every 12 (twelve) hours as needed for nausea.  12 suppository  3  . spironolactone (ALDACTONE) 25 MG tablet Take 0.5 tablets (12.5 mg total) by mouth daily.  30 tablet  3   No current facility-administered medications for this visit.   Facility-Administered Medications Ordered in Other Visits  Medication Dose Route Frequency Provider Last Rate Last Dose  . sodium chloride 0.9 % injection 10 mL  10 mL Intracatheter PRN Victorino December, MD   10 mL at 10/04/12 1325    SURGICAL HISTORY:  Past Surgical History  Procedure Laterality Date  . Cyst removed from left breast  1970  . Left wrist surgery   2004    with plate  . Colonoscopy    . Esophagogastroduodenoscopy    . Mastectomy, radical    . Mastectomy modified radical  05/10/2012    Procedure: MASTECTOMY MODIFIED RADICAL;  Surgeon: Robyne Askew, MD;  Location: [facility] OR;   Service: General;  Laterality: Right;  RIGHT MODIFIED RADICAL MASTECTOMY  . Portacath placement  05/10/2012    Procedure: INSERTION PORT-A-CATH;  Surgeon: Caleen Essex III, MD;  Location: [facility] OR;  Service: General;  Laterality: Left;    REVIEW OF SYSTEMS:  General: fatigue (+), night sweats (-), fever (-), pain (-) Lymph: palpable nodes (-) HEENT: vision changes (-), mucositis (-), gum  bleeding (-), epistaxis (-) Cardiovascular: chest pain (-), palpitations (-) Pulmonary: shortness of breath (-), dyspnea on exertion (-), cough (-), hemoptysis (-) GI:  Early satiety (-), melena (-), dysphagia (-), nausea/vomiting (-), diarrhea (-) GU: dysuria (-), hematuria (-), incontinence (-) Musculoskeletal: joint swelling (-), joint pain (-), back pain (-) Neuro: weakness (-), numbness (+), headache (-), confusion (-) Skin: Rash (-), lesions (-), dryness (-) Psych: depression (-), suicidal/homicidal ideation (-), feeling of hopelessness (-)  PHYSICAL EXAMINATION: Blood pressure 133/82, pulse 97, temperature 98.1 F (36.7 C), temperature source Oral, resp. rate 18, height 5\' 4"  (1.626 m), weight 138 lb 6 oz (62.766 kg). Body mass index is 23.74 kg/(m^2). General: Patient is a well appearing female in no acute distress HEENT: PERRLA, sclerae anicteric no conjunctival pallor, MMM Neck: supple, no palpable adenopathy Lungs: clear to auscultation bilaterally, no wheezes, rhonchi, or rales Cardiovascular: regular rate rhythm, S1, S2, no murmurs, rubs or gallops Abdomen: Soft, non-tender, non-distended, normoactive bowel sounds, no HSM Extremities: warm and well perfused, no clubbing, cyanosis, or edema Skin: No rashes or lesions, pustular rash on scalp Neuro: Non-focal Breasts: The right breast is status post mastectomy. There is no evidence of local recurrence. The right axilla is benign. The left breast is unremarkable. ECOG PERFORMANCE STATUS: 1   LABORATORY DATA: Lab Results  Component Value Date    WBC 2.0* 10/04/2012   HGB 10.8* 10/04/2012   HCT 31.7* 10/04/2012   MCV 101.9* 10/04/2012   PLT 156 10/04/2012      Chemistry      Component Value Date/Time   NA 133* 10/04/2012 1009   NA 138 05/11/2012 0640   K 3.5 10/04/2012 1009   K 4.7 05/11/2012 0640   CL 99 10/04/2012 1009   CL 99 05/11/2012 0640   CO2 25 10/04/2012 1009   CO2 20 05/11/2012 0640   BUN 13.2 10/04/2012 1009   BUN 12 05/11/2012 0640   CREATININE 0.8 10/04/2012 1009   CREATININE 0.88 05/11/2012 0640      Component Value Date/Time   CALCIUM 8.6 10/04/2012 1009   CALCIUM 9.7 05/11/2012 0640   ALKPHOS 86 10/04/2012 1009   AST 19 10/04/2012 1009   ALT 20 10/04/2012 1009   BILITOT 0.62 10/04/2012 1009     ADDITIONAL INFORMATION: CHROMOGENIC IN-SITU HYBRIDIZATION (1C - TUMOR WITH CLIP) Interpretation HER-2/NEU BY CISH - NO AMPLIFICATION OF HER-2 DETECTED. THE RATIO OF HER-2: CEP 17 SIGNALS WAS 1.25. Reference range: Ratio: HER2:CEP17 < 1.8 - gene amplification not observed Ratio: HER2:CEP 17 1.8-2.2 - equivocal result Ratio: HER2:CEP17 > 2.2 - gene amplification observed CHROMOGENIC IN-SITU HYBRIDIZATION (1E - TUMOR WITHOUT CLIP) Interpretation: HER2/NEU BY CISH - SHOWS AMPLIFICATION BY CISH ANALYSIS. THE RATIO OF HER2: CEP 17 SIGNALS WAS 2.83. Reference range: Ratio: HER2:CEP17 < 1.8 gene amplification not observed Ratio: HER2:CEP 17 1.8-2.2 - equivocal result Ratio: HER2:CEP17 > 2.2 - gene amplification observed Pecola Leisure MD Pathologist, Electronic Signature ( Signed 05/15/2012) FINAL DIAGNOSIS Diagnosis Breast, modified radical mastectomy , right, with axillary contents 1 of 4 FINAL for Heather [name], Heather [name] ([id number]) Diagnosis(continued) TUMOR #1 (WITH CLIP): - INVASIVE LOBULAR CARCINOMA, GRADE II, SEE COMMENT. - LYMPHOVASCULAR INVASION IDENTIFIED. - PREVIOUS BIOPSY SITE IDENTIFIED. TUMOR #2 (NO CLIP): - INVASIVE LOBULAR CARCINOMA, GRADE II, SEE COMMENT. - LOBULAR CARCINOMA IN SITU. - LYMPHOVASCULAR INVASION  IDENTIFIED. - THREE LYMPH NODES, POSITIVE FOR METASTATIC MAMMARY CARCINOMA (3/7). - TUMOR DEPOSITS ARE 1.7, 1.6, AND 5 CM. - EXTRACAPSULAR TUMOR EXTENSION IDENTIFIED. - TWO LYMPH NODES, POSITIVE  FOR ISOLATED TUMOR CELLS. Microscopic Comment BREAST, INVASIVE TUMOR, WITH LYMPH NODE SAMPLING Specimen, including laterality: Right breast Procedure: Modified radical mastectomy Grade: Tumor 1 and tumor 2: Grade II of III Tubule formation: 3 Nuclear pleomorphism: 2 Mitotic: 1 Tumor size (gross measurement: Tumor #1- 2.3 cm; tumor #2 - 2.4 cm Margins: Invasive, distance to closest margin: Tumor #1 2.7 cm - deep margin; tumor #2 - 1.6 cm - deep margin Lymphovascular invasion: Present Ductal carcinoma in situ: Absent Grade: N/A Extensive intraductal component: N/A Lobular neoplasia: Both atypical hyperplasia and in situ carcinoma Extent of tumor: Skin: Grossly negative Nipple: Grossly negative Skeletal muscle: Microscopically negative Lymph nodes: # examined: 7 Lymph nodes with metastasis: 3 Isolated tumor cells (< 0.2 mm): two lymph nodes (Slide 1P and 1O) Micrometastasis: (> 0.2 mm and < 2.0 mm): none Macrometastasis: (> 2.0 mm): 1.7 cm , 1.6 cm and 5 cm Extracapsular extension: Present Breast prognostic profile: Tumor #1: Estrogen receptor: Not repeated, previous study demonstrated 98% positivity ([id number]) Progesterone receptor: Not repeated, previous study demonstrated 0% positivity ([id number]) Her 2 neu: Repeated, previous study demonstrated no amplification (1.38) ([id number]) Ki-67: Not repeated, previous study demonstrated 5% proliferation rate ([id number]) Tumor #2: Estrogen receptor: Not repeated, previous study demonstrated 97% positivity ([id number]) Progesterone receptor: Not repeated, previous study demonstrated 0% positivity ([id number]) Her 2 neu: Repeated, previous study demonstrated amplification (3.65) ([id number]) Ki-67: Not repeated, previous study  demonstrated 44% proliferation rate ([id number]) 2 of 4 FINAL for AAYLIAH, ROTENBERRY (SZA14-4) Microscopic Comment(continued) Non-neoplastic breast: Benign fibrocystic change with usual ductal hyperplasia, sclerosing adenosis, benign radial sclerosing lesion, and microcalcifications in benign ducts and lobules. TNM: mpT2, pN1a, pMX. Comments: none  RADIOGRAPHIC STUDIES:  Mr Lodema Pilot Contrast  03/23/2012  *RADIOLOGY REPORT*  Clinical Data: New diagnosis breast cancer.  Rule out metastatic disease.  MRI HEAD WITHOUT AND WITH CONTRAST  Technique:  Multiplanar, multiecho pulse sequences of the brain and surrounding structures were obtained according to standard protocol without and with intravenous contrast  Contrast: 12mL MULTIHANCE GADOBENATE DIMEGLUMINE 529 MG/ML IV SOLN  Comparison: None.  Findings: Ventricle size is normal.  Small 4 mm hyperintensities in the cerebral white matter bilaterally do not enhance and are most likely due to chronic microvascular ischemia.  Negative for acute infarct.  Diffusion weighted imaging is normal.  Pituitary is normal in size.  Negative for intracranial hemorrhage.  Following contrast infusion, no enhancing lesions are identified.  Paranasal sinuses are clear.  Vessels at the base of the brain are patent.  IMPRESSION: Negative for metastatic disease.  No acute abnormality.   Original Report Authenticated By: Janeece Riggers, M.D.    Mr Breast Bilateral W Wo Contrast  03/12/2012  *RADIOLOGY REPORT*  Clinical Data: Recently diagnosed invasive mammary carcinoma in the 10 o'clock region of the right breast with a metastatic right axillary lymph node.  BILATERAL BREAST MRI WITH AND WITHOUT CONTRAST  Technique: Multiplanar, multisequence MR images of both breasts were obtained prior to and following the intravenous administration of 12ml of multihance.  Three dimensional images were evaluated at the independent DynaCad workstation.  Comparison:  Mammograms dated 03/05/2012,  03/01/2012, 03/01/2011 and 02/26/2010 from [facility].  Findings: There is a moderate background parenchymal enhancement pattern.  Right breast: 1.  There is a lobulated 2.2 x 1.7 x 1.8 cm mass in the posterior third of the upper outer quadrant of the right breast.  There is a post biopsy clip artifact 1 cm lateral to the mass. 2.  In the  anterior third upper outer quadrant of the right breast there is patchy non mass like enhancement measuring 0.8 x 0.3 x 0.3 cm.  Left breast:  There is no abnormal enhancement the left breast.  There are enlarged right axillary lymph nodes.  The largest right axillary lymph node measures 2.4 cm and is located lateral to the pectoralis minor consistent with level I adenopathy.  There is a 1.6 cm lymph node behind the pectoralis minor consistent with enlarged level II adenopathy.  IMPRESSION:  1.  2.2 cm mass in the posterior third of the upper outer quadrant of the right breast corresponding with the known invasive mammary carcinoma. 2.  Patchy non mass-like enhancement in the anterior third of the upper outer quadrant of the right breast.  MR guided core biopsy is recommended. 3. Enlarged level I and II right axillary adenopathy.  RECOMMENDATION: MR guided core biopsy of the right breast.  THREE-DIMENSIONAL MR IMAGE RENDERING ON INDEPENDENT WORKSTATION:  Three-dimensional MR images were rendered by post-processing of the original MR data on an independent workstation.  The three- dimensional MR images were interpreted, and findings were reported in the accompanying complete MRI report for this study.  BI-RADS CATEGORY 4:  Suspicious abnormality - biopsy should be considered.   Original Report Authenticated By: Baird Lyons, M.D.    Mr Biopsy/wire Localization  03/20/2012  *RADIOLOGY REPORT*  Clinical Data:  Known right breast carcinoma.  Additional area of worrisome enhancement located within the anterior upper outer quadrant of the right breast.  MRI GUIDED VACUUM  ASSISTED BIOPSY OF THE RIGHT BREAST WITHOUT AND WITH CONTRAST  Comparison: Previous exams.  Technique: Multiplanar, multisequence MR images of the right breast were obtained prior to and following the intravenous administration of 12 ml of Mulithance.  I met with the patient, and we discussed the procedure of MRI guided biopsy, including risks, benefits, and alternatives. Specifically, we discussed the risks of infection, bleeding, tissue injury, clip migration, and inadequate sampling.  Informed, written consent was given.  Using sterile technique, 2% Lidocaine, MRI guidance, and a 9 gauge vacuum assisted device, biopsy was performed of the area of enhancement located within the anterior portion of the upper-outer quadrant - right breast using a lateral approach.  At the conclusion of the procedure, a  tissue marker clip was deployed into the biopsy cavity.  IMPRESSION: MRI guided biopsy of the area of enhancement located within the anterior portion of the upper-outer quadrant of the right breast as discussed above. No apparent complications.  BI-RADS CATEGORY 6:  Known biopsy-proven malignancy - appropriate action should be taken.  THREE-DIMENSIONAL MR IMAGE RENDERING ON INDEPENDENT WORKSTATION:  Three-dimensional MR images were rendered by post-processing of the original MR data on an independent workstation.  The three- dimensional MR images were interpreted, and findings were reported in the accompanying complete MRI report for this study.  The pathology associated with the right breast MR guided core biopsy demonstrated invasive mammary carcinoma and in situ mammary carcinoma.  This is concordant with imaging findings.  I have discussed findings with the patient by telephone and answered her questions.  The patient states that biopsy site is clean and dry without hematoma formation or signs of infection.  Post biopsy wound care instructions were reviewed with the patient.  Follow-up will be with Dr. Carolynne Edouard and  Dr. Welton Flakes regards her treatment plan.  The patient was encouraged to call the Breast Center for additional questions or concerns   Original Report Authenticated By: Rolla Plate, M.D.  Nm Pet Image Initial (pi) Skull Base To Thigh  03/22/2012  *RADIOLOGY REPORT*  Clinical Data: Initial treatment strategy for high risk right breast cancer.  NUCLEAR MEDICINE PET SKULL BASE TO THIGH  Fasting Blood Glucose:  100  Technique:  18.6 mCi F-18 FDG was injected intravenously. CT data was obtained and used for attenuation correction and anatomic localization only.  (This was not acquired as a diagnostic CT examination.) Additional exam technical data entered on technologist worksheet.  Comparison:  MRI breast dated 03/12/2012  Findings:  Neck: No hypermetabolic lymph nodes in the neck.  Chest:  Vague hypermetabolism in the upper/central right breast, max SUV 2.7.  Additional focal hypermetabolism in the upper/outer right breast, max SUV 2.3 (PET image 105).  These findings correspond to biopsy-proven primary breast carcinoma.  1.3 cm short axis right subpectoral lymph node (series 2/image 66), max SUV 3.1.  Right axillary lymphadenopathy measuring up to 1.6 cm short axis (series 2/image 88), max SUV 4.8).  No hypermetabolic mediastinal or hilar nodes.  No suspicious pulmonary nodules on the CT scan.  Abdomen/Pelvis:  No abnormal hypermetabolic activity within the liver, pancreas, adrenal glands, or spleen.  No hypermetabolic lymph nodes in the abdomen or pelvis.  Skeleton:  No focal hypermetabolic activity to suggest skeletal metastasis.  IMPRESSION: Two foci of hypermetabolism in the upper right breast, max SUV 2.7, corresponding to biopsy-proven primary breast carcinoma.  Associated right subpectoral and axillary nodal metastases, max SUV 4.8.  No evidence of distant metastases.   Original Report Authenticated By: Charline Bills, M.D.    Mm Digital Diagnostic Unilat R  03/20/2012  *RADIOLOGY REPORT*  Clinical  Data:  Post right breast MR guided core biopsy.  DIGITAL DIAGNOSTIC RIGHT BREAST MAMMOGRAM  Comparison:  03/05/2012, 03/01/2012, 03/01/2011, 02/26/2010 mammograms from [facility].  Findings:  Films are performed following MR guided biopsy of the anterior portion of the upper-outer quadrant of the right breast. The hourglass shaped clip placed during the MR guided core biopsy appears in appropriate position.  IMPRESSION: Appropriate positioning of clip following right breast MR guided core biopsy.   Original Report Authenticated By: Rolla Plate, M.D.     ASSESSMENT: 67 year old [place], [place] woman  #1  status post mastectomy with right axillary lymph node dissection 05/10/2012 for an mpT2 N1, stage stage IIB invasive lobular breast cancer, stage IIB, ER positive, HER-2/neu positive, with an Mib-1 between 5% and 44%  #2 receiving adjuvant chemotherapy consisting of Taxotere, carboplatinum and Herceptin given every 21 days for total of 6 cycles of Taxotere carboplatinum and weekly Herceptin beginning on 06/14/12.  #3 Herceptin to be continued to total one year; most recent echo 09/17/12.  PLAN:   1. Ms. Reposa is doing well. She will proceed with Herceptin. She is neutropenic.  She declined taking Cipro.  She will call immediately should she have any fevers, chills, or rigors.  I gave her detailed instructions and included these in her AVS.    2. She will continue to take Super B complex daily.   3. Ms. Haws will return next week for labs, appt, chemotherapy.     All questions were answered. The patient knows to call the clinic with any problems, questions or concerns. We can certainly see the patient much sooner if necessary.   I spent 25 minutes counseling the patient face to face. The total time spent in the appointment was 30 minutes.  Cherie Ouch Lyn Hollingshead, NP Medical Oncology [facility] Phone: [phone] 10/04/2012, 2:03 PM

## 2012-10-04 NOTE — Progress Notes (Signed)
Ok to treat with Herceptin per Augustin Schooling NP.

## 2012-10-04 NOTE — Patient Instructions (Signed)
  Patient Neutropenia Instruction Sheet  Diagnosis: Breast Cancer      Treating Physician: Drue Second, MD  Treatment: 1. Type of chemotherapy: TCH 2. Date of last treatment: 09/27/12  Last Blood Counts: Lab Results  Component Value Date   WBC 2.0* 10/04/2012   HGB 10.8* 10/04/2012   HCT 31.7* 10/04/2012   MCV 101.9* 10/04/2012   PLT 156 10/04/2012   ANC 400     Prophylactic Antibiotics: Cipro 500 mg by mouth twice a day Instructions: 1. Monitor temperature and call if fever  greater than 100.5, chills, shaking chills (rigors) 2. Call Physician on-call at [phone] 3. Give him/her symptoms and list of medications that you are taking and your last blood count.

## 2012-10-04 NOTE — Patient Instructions (Signed)
[facility] Discharge Instructions for Patients Receiving Chemotherapy  Today you received the following chemotherapy agents Herceptin.  To help prevent nausea and vomiting after your treatment, we encourage you to take your nausea medication as ordered per MD.    If you develop nausea and vomiting that is not controlled by your nausea medication, call the clinic. If it is after clinic hours your family physician or the after hours number for the clinic or go to the Emergency Department.   BELOW ARE SYMPTOMS THAT SHOULD BE REPORTED IMMEDIATELY:  *FEVER GREATER THAN 100.5 F  *CHILLS WITH OR WITHOUT FEVER  NAUSEA AND VOMITING THAT IS NOT CONTROLLED WITH YOUR NAUSEA MEDICATION  *UNUSUAL SHORTNESS OF BREATH  *UNUSUAL BRUISING OR BLEEDING  TENDERNESS IN MOUTH AND THROAT WITH OR WITHOUT PRESENCE OF ULCERS  *URINARY PROBLEMS  *BOWEL PROBLEMS  UNUSUAL RASH Items with * indicate a potential emergency and should be followed up as soon as possible.  . Please let the nurse know about any problems that you may have experienced. Feel free to call the clinic you have any questions or concerns. The clinic phone number is [phone].   I have been informed and understand all the instructions given to me. I know to contact the clinic, my physician, or go to the Emergency Department if any problems should occur. I do not have any questions at this time, but understand that I may call the clinic during office hours   should I have any questions or need assistance in obtaining follow up care.    __________________________________________  _____________  __________ Signature of Patient or Authorized Representative            Date                   Time    __________________________________________ Nurse's Signature    

## 2012-10-10 ENCOUNTER — Other Ambulatory Visit: Payer: Medicare Other | Admitting: Lab

## 2012-10-10 ENCOUNTER — Ambulatory Visit: Payer: Medicare Other | Admitting: Adult Health

## 2012-10-11 ENCOUNTER — Other Ambulatory Visit: Payer: Medicare Other | Admitting: Lab

## 2012-10-11 ENCOUNTER — Ambulatory Visit: Payer: Medicare Other

## 2012-10-11 ENCOUNTER — Ambulatory Visit: Payer: Medicare Other | Admitting: Adult Health

## 2012-10-15 ENCOUNTER — Ambulatory Visit
Admission: RE | Admit: 2012-10-15 | Discharge: 2012-10-15 | Disposition: A | Discharge status: Home / Self Care | Payer: Medicare Other | Source: Ambulatory Visit | Attending: Radiation Oncology | Admitting: Radiation Oncology

## 2012-10-15 ENCOUNTER — Ambulatory Visit: Payer: Medicare Other | Admitting: Physical Therapy

## 2012-10-15 DIAGNOSIS — R229 Localized swelling, mass and lump, unspecified: Secondary | ICD-10-CM | POA: Insufficient documentation

## 2012-10-15 DIAGNOSIS — Y842 Radiological procedure and radiotherapy as the cause of abnormal reaction of the patient, or of later complication, without mention of misadventure at the time of the procedure: Secondary | ICD-10-CM | POA: Insufficient documentation

## 2012-10-15 DIAGNOSIS — Z510 Encounter for antineoplastic radiation therapy: Secondary | ICD-10-CM | POA: Insufficient documentation

## 2012-10-15 DIAGNOSIS — M24519 Contracture, unspecified shoulder: Secondary | ICD-10-CM | POA: Insufficient documentation

## 2012-10-15 DIAGNOSIS — Z9010 Acquired absence of unspecified breast and nipple: Secondary | ICD-10-CM | POA: Insufficient documentation

## 2012-10-15 DIAGNOSIS — R233 Spontaneous ecchymoses: Secondary | ICD-10-CM | POA: Insufficient documentation

## 2012-10-15 DIAGNOSIS — C50411 Malignant neoplasm of upper-outer quadrant of right female breast: Secondary | ICD-10-CM

## 2012-10-15 DIAGNOSIS — C50919 Malignant neoplasm of unspecified site of unspecified female breast: Secondary | ICD-10-CM | POA: Insufficient documentation

## 2012-10-15 DIAGNOSIS — I890 Lymphedema, not elsewhere classified: Secondary | ICD-10-CM | POA: Insufficient documentation

## 2012-10-15 DIAGNOSIS — L299 Pruritus, unspecified: Secondary | ICD-10-CM | POA: Insufficient documentation

## 2012-10-15 DIAGNOSIS — R5381 Other malaise: Secondary | ICD-10-CM | POA: Insufficient documentation

## 2012-10-15 DIAGNOSIS — L539 Erythematous condition, unspecified: Secondary | ICD-10-CM | POA: Insufficient documentation

## 2012-10-15 DIAGNOSIS — IMO0001 Reserved for inherently not codable concepts without codable children: Secondary | ICD-10-CM | POA: Insufficient documentation

## 2012-10-15 DIAGNOSIS — N6482 Hypoplasia of breast: Secondary | ICD-10-CM | POA: Insufficient documentation

## 2012-10-15 DIAGNOSIS — L589 Radiodermatitis, unspecified: Secondary | ICD-10-CM | POA: Insufficient documentation

## 2012-10-15 NOTE — Progress Notes (Signed)
Complex simulation/treatment planning note: The patient was taken to the CT simulator. She was placed on a custom breast board and a custom neck mold was constructed for immobilization. Her field borders were marked with radiopaque wires. Her mastectomy scar was also marked with radiopaque wire. She was then scanned. An isocenter was chosen along the superior right chest just superior to the superior "border" wire. The CT data set was sent to the treatment planning system for contouring of her normal structures including the lung, intramammary lymph nodes and mastectomy scar. She was set up to medial and lateral tangential fields in 2 separate multileaf collimators were designed to conform the field. She was then set up to LAO to the right supraclavicular/axillary region and a separate multileaf collimator was designed. Lastly she was set up PA to her right axilla and a fourth multileaf collimator was designed. I am prescribing 5040 cGy to all sites utilizing 6 MV photons. The right supraclavicular dose will be prescribed at 3 cm step and the PA axillary field will bring the mid axillary dose up to 4600 cGy in 28 sessions. On the first day of her treatment she'll have construction of 1.0 cm custom bolus and this will be  applied to her skin every other day to maximize the dose to the skin surface. Following her right chest wall irradiation she'll undergo a mastectomy scar boost for a further 1000 cGy in 5 sessions.

## 2012-10-15 NOTE — Progress Notes (Signed)
OFFICE PROGRESS NOTE   [name], MD [address] [place] [address] Dr. Chevis Pretty Dr. Chipper Herb  DIAGNOSIS: 67 year old female with invasive lobular breast cancer, stage IIB, receiving adjuvant chemotherapy  PRIOR THERAPY:  #1 patient was originally seen in the multidisciplinary breast clinic on 03/14/2012 for new diagnosis of invasive ductal carcinoma that was HER-2 positive node-positive, ER positive PR negative with Ki-67 of 44%  #2 on MRI patient also had an anterior breast mass that was to be biopsied to decide whether or not she should receive neoadjuvant chemotherapy or adjuvant chemotherapy. Patient's mass was biopsied and it was positive for a malignancy that was HER-2/neu negative.  #3 patient is now status post right mastectomy with axillary lymph node dissection with the final pathology revealing the largest tumor measuring 5 cm, 1.7 cm and 1.6 cm. Tumor was ER positive PR negative HER-2/neu positive with a Ki-67 of 44% 3 of 7 lymph nodes were positive for metastatic disease.  #4 patient will proceed with adjuvant chemotherapy and Herceptin. Chemotherapy will consist of Taxotere carboplatinum given every 21 days with weekly Herceptin. Total of 6 cycles of Taxotere carboplatin will be administered starting on 06/14/2012. Once patient completes her chemotherapy then she will go on to receive radiation therapy along with Herceptin with the Herceptin then being converted to every 3 weeks to finish a total of one year. Because patient's tumor is ER positive she will also be given antiestrogen therapy adjuvantly.  #5 patient has now completed 6 cycles of Taxotere carboplatinum and Herceptin from 06/14/2012 through 09/27/2012.  #6 she will also proceed after completion of her chemotherapy to radiation adjuvantly. Along with this she will continue Herceptin every 3 weeks beginning 10/04/2012.  CURRENT THERAPY:Cycle 6 of TCH  INTERVAL HISTORY: Ms. Chenard is here for  f/u prior to her adjuvant treatment of her right breast cancer. Overall patient is feeling quite well. She denies any nausea vomiting fevers chills night sweats headaches shortness of breath chest pains palpitations she has no myalgias and arthralgias. Her echocardiograms are up to date. Remainder of the 10 point review of systems is negative.   MEDICAL HISTORY: Past Medical History  Diagnosis Date  . Chronic kidney disease   . Hx: UTI (urinary tract infection)   . Right shoulder pain   . Right knee pain   . Hypertension     recently started Aldactone   . Hyperlipidemia     but not on meds;diet and exercise controlled  . Dizziness     has been going on for 4months;medical MD aware  . H/O hiatal hernia   . GERD (gastroesophageal reflux disease)     Tums prn  . Hemorrhoids   . Urinary frequency     d/t taking Aldactone  . History of UTI   . Anemia     hx of  . Cataract     immature-not sure of which eye  . Anxiety     r/t updated surgery  . Insomnia     takes Melatoniin daily  . Complication of anesthesia     pt states she is sensitive to meds  . Breast cancer     right  . Skin cancer     ALLERGIES:  is allergic to codeine; keflex; naprosyn; and tussin.  MEDICATIONS:  Current Outpatient Prescriptions  Medication Sig Dispense Refill  . B Complex-C (SUPER B COMPLEX PO) Take 1 each by mouth daily.      . calcium carbonate (TUMS - DOSED  IN MG ELEMENTAL CALCIUM) 500 MG chewable tablet Chew 1 tablet by mouth daily.      . Cholecalciferol (VITAMIN D-3 PO) Take 4,000 Units by mouth daily.      Marland Kitchen lidocaine-prilocaine (EMLA) cream Apply topically as needed.  30 g  7  . Loratadine (CLARITIN PO) Take 1 each by mouth daily.      Marland Kitchen LORazepam (ATIVAN) 0.5 MG tablet Take 1 tablet (0.5 mg total) by mouth every 6 (six) hours as needed (Nausea or vomiting).  30 tablet  0  . magnesium gluconate (MAGONATE) 500 MG tablet Take 500 mg by mouth 2 (two) times daily.      Marland Kitchen omeprazole (PRILOSEC)  40 MG capsule Take 1 capsule (40 mg total) by mouth daily.  30 capsule  6  . ondansetron (ZOFRAN) 8 MG tablet Take 1 tablet (8 mg total) by mouth 2 (two) times daily. Take two times a day starting the day after chemo for 3 days. Then take two times a day as needed for nausea or vomiting.  30 tablet  1  . Probiotic Product (PROBIOTIC DAILY PO) Take 1 each by mouth.      . spironolactone (ALDACTONE) 25 MG tablet Take 0.5 tablets (12.5 mg total) by mouth daily.  30 tablet  3  . clindamycin (CLINDAGEL) 1 % gel Apply topically 2 (two) times daily.  30 g  0  . dexamethasone (DECADRON) 4 MG tablet Take 2 tablets (8 mg total) by mouth 2 (two) times daily with a meal. Take two times a day the day before Taxotere. Then take two times a day starting the day after chemo for 3 days.  36 tablet  5  . doxycycline (VIBRA-TABS) 100 MG tablet Take 1 tablet (100 mg total) by mouth 2 (two) times daily.  20 tablet  0  . gabapentin (NEURONTIN) 100 MG capsule Take 1 capsule (100 mg total) by mouth 3 (three) times daily.  90 capsule  2  . PREVIDENT 5000 BOOSTER PLUS 1.1 % PSTE Take 1 application by mouth Twice daily.      . prochlorperazine (COMPAZINE) 10 MG tablet Take 1 tablet (10 mg total) by mouth every 6 (six) hours as needed (Nausea or vomiting).  30 tablet  1  . prochlorperazine (COMPAZINE) 25 MG suppository Place 1 suppository (25 mg total) rectally every 12 (twelve) hours as needed for nausea.  12 suppository  3   No current facility-administered medications for this visit.    SURGICAL HISTORY:  Past Surgical History  Procedure Laterality Date  . Cyst removed from left breast  1970  . Left wrist surgery   2004    with plate  . Colonoscopy    . Esophagogastroduodenoscopy    . Mastectomy, radical    . Mastectomy modified radical  05/10/2012    Procedure: MASTECTOMY MODIFIED RADICAL;  Surgeon: Robyne Askew, MD;  Location: [facility] OR;  Service: General;  Laterality: Right;  RIGHT MODIFIED RADICAL MASTECTOMY  .  Portacath placement  05/10/2012    Procedure: INSERTION PORT-A-CATH;  Surgeon: Robyne Askew, MD;  Location: [facility] OR;  Service: General;  Laterality: Left;    REVIEW OF SYSTEMS:  General: fatigue (+), night sweats (-), fever (-), pain (-) Lymph: palpable nodes (-) HEENT: vision changes (-), mucositis (-), gum bleeding (-), epistaxis (-) Cardiovascular: chest pain (-), palpitations (-) Pulmonary: shortness of breath (-), dyspnea on exertion (-), cough (-), hemoptysis (-) GI:  Early satiety (-), melena (-), dysphagia (-), nausea/vomiting (-),  diarrhea (-) GU: dysuria (-), hematuria (-), incontinence (-) Musculoskeletal: joint swelling (-), joint pain (-), back pain (-) Neuro: weakness (-), numbness (+), headache (-), confusion (-) Skin: Rash (-), lesions (-), dryness (-) Psych: depression (-), suicidal/homicidal ideation (-), feeling of hopelessness (-)    PHYSICAL EXAMINATION: Blood pressure 142/73, pulse 89, temperature 98 F (36.7 C), temperature source Oral, resp. rate 20, height 5\' 4"  (1.626 m), weight 143 lb 9.6 oz (65.137 kg). Body mass index is 24.64 kg/(m^2). General: Patient is a well appearing female in no acute distress HEENT: PERRLA, sclerae anicteric no conjunctival pallor, MMM Neck: supple, no palpable adenopathy Lungs: clear to auscultation bilaterally, no wheezes, rhonchi, or rales Cardiovascular: regular rate rhythm, S1, S2, no murmurs, rubs or gallops Abdomen: Soft, non-tender, non-distended, normoactive bowel sounds, no HSM Extremities: warm and well perfused, no clubbing, cyanosis, or edema Skin: No rashes or lesions, pustular rash on scalp Neuro: Non-focal Breasts: The right breast is status post mastectomy. There is no evidence of local recurrence. The right axilla is benign. The left breast is unremarkable. ECOG PERFORMANCE STATUS: 1   LABORATORY DATA: Lab Results  Component Value Date   WBC 2.0* 10/04/2012   HGB 10.8* 10/04/2012   HCT 31.7* 10/04/2012   MCV  101.9* 10/04/2012   PLT 156 10/04/2012      Chemistry      Component Value Date/Time   NA 133* 10/04/2012 1009   NA 138 05/11/2012 0640   K 3.5 10/04/2012 1009   K 4.7 05/11/2012 0640   CL 99 10/04/2012 1009   CL 99 05/11/2012 0640   CO2 25 10/04/2012 1009   CO2 20 05/11/2012 0640   BUN 13.2 10/04/2012 1009   BUN 12 05/11/2012 0640   CREATININE 0.8 10/04/2012 1009   CREATININE 0.88 05/11/2012 0640      Component Value Date/Time   CALCIUM 8.6 10/04/2012 1009   CALCIUM 9.7 05/11/2012 0640   ALKPHOS 86 10/04/2012 1009   AST 19 10/04/2012 1009   ALT 20 10/04/2012 1009   BILITOT 0.62 10/04/2012 1009     ADDITIONAL INFORMATION: CHROMOGENIC IN-SITU HYBRIDIZATION (1C - TUMOR WITH CLIP) Interpretation HER-2/NEU BY CISH - NO AMPLIFICATION OF HER-2 DETECTED. THE RATIO OF HER-2: CEP 17 SIGNALS WAS 1.25. Reference range: Ratio: HER2:CEP17 < 1.8 - gene amplification not observed Ratio: HER2:CEP 17 1.8-2.2 - equivocal result Ratio: HER2:CEP17 > 2.2 - gene amplification observed CHROMOGENIC IN-SITU HYBRIDIZATION (1E - TUMOR WITHOUT CLIP) Interpretation: HER2/NEU BY CISH - SHOWS AMPLIFICATION BY CISH ANALYSIS. THE RATIO OF HER2: CEP 17 SIGNALS WAS 2.83. Reference range: Ratio: HER2:CEP17 < 1.8 gene amplification not observed Ratio: HER2:CEP 17 1.8-2.2 - equivocal result Ratio: HER2:CEP17 > 2.2 - gene amplification observed Pecola Leisure MD Pathologist, Electronic Signature ( Signed 05/15/2012) FINAL DIAGNOSIS Diagnosis Breast, modified radical mastectomy , right, with axillary contents 1 of 4 FINAL for MARISELDA, BADALAMENTI S ([id number]) Diagnosis(continued) TUMOR #1 (WITH CLIP): - INVASIVE LOBULAR CARCINOMA, GRADE II, SEE COMMENT. - LYMPHOVASCULAR INVASION IDENTIFIED. - PREVIOUS BIOPSY SITE IDENTIFIED. TUMOR #2 (NO CLIP): - INVASIVE LOBULAR CARCINOMA, GRADE II, SEE COMMENT. - LOBULAR CARCINOMA IN SITU. - LYMPHOVASCULAR INVASION IDENTIFIED. - THREE LYMPH NODES, POSITIVE FOR METASTATIC MAMMARY CARCINOMA  (3/7). - TUMOR DEPOSITS ARE 1.7, 1.6, AND 5 CM. - EXTRACAPSULAR TUMOR EXTENSION IDENTIFIED. - TWO LYMPH NODES, POSITIVE FOR ISOLATED TUMOR CELLS. Microscopic Comment BREAST, INVASIVE TUMOR, WITH LYMPH NODE SAMPLING Specimen, including laterality: Right breast Procedure: Modified radical mastectomy Grade: Tumor 1 and tumor 2: Grade II of III  Tubule formation: 3 Nuclear pleomorphism: 2 Mitotic: 1 Tumor size (gross measurement: Tumor #1- 2.3 cm; tumor #2 - 2.4 cm Margins: Invasive, distance to closest margin: Tumor #1 2.7 cm - deep margin; tumor #2 - 1.6 cm - deep margin Lymphovascular invasion: Present Ductal carcinoma in situ: Absent Grade: N/A Extensive intraductal component: N/A Lobular neoplasia: Both atypical hyperplasia and in situ carcinoma Extent of tumor: Skin: Grossly negative Nipple: Grossly negative Skeletal muscle: Microscopically negative Lymph nodes: # examined: 7 Lymph nodes with metastasis: 3 Isolated tumor cells (< 0.2 mm): two lymph nodes (Slide 1P and 1O) Micrometastasis: (> 0.2 mm and < 2.0 mm): none Macrometastasis: (> 2.0 mm): 1.7 cm , 1.6 cm and 5 cm Extracapsular extension: Present Breast prognostic profile: Tumor #1: Estrogen receptor: Not repeated, previous study demonstrated 98% positivity ([id number]) Progesterone receptor: Not repeated, previous study demonstrated 0% positivity ([id number]) Her 2 neu: Repeated, previous study demonstrated no amplification (1.38) ([id number]) Ki-67: Not repeated, previous study demonstrated 5% proliferation rate ([id number]) Tumor #2: Estrogen receptor: Not repeated, previous study demonstrated 97% positivity ([id number]) Progesterone receptor: Not repeated, previous study demonstrated 0% positivity ([id number]) Her 2 neu: Repeated, previous study demonstrated amplification (3.65) ([id number]) Ki-67: Not repeated, previous study demonstrated 44% proliferation rate ([id number]) 2 of 4 FINAL for ZURIA, FOSDICK (SZA14-4) Microscopic Comment(continued) Non-neoplastic breast: Benign fibrocystic change with usual ductal hyperplasia, sclerosing adenosis, benign radial sclerosing lesion, and microcalcifications in benign ducts and lobules. TNM: mpT2, pN1a, pMX. Comments: none  RADIOGRAPHIC STUDIES:  Mr Lodema Pilot Contrast  03/23/2012  *RADIOLOGY REPORT*  Clinical Data: New diagnosis breast cancer.  Rule out metastatic disease.  MRI HEAD WITHOUT AND WITH CONTRAST  Technique:  Multiplanar, multiecho pulse sequences of the brain and surrounding structures were obtained according to standard protocol without and with intravenous contrast  Contrast: 12mL MULTIHANCE GADOBENATE DIMEGLUMINE 529 MG/ML IV SOLN  Comparison: None.  Findings: Ventricle size is normal.  Small 4 mm hyperintensities in the cerebral white matter bilaterally do not enhance and are most likely due to chronic microvascular ischemia.  Negative for acute infarct.  Diffusion weighted imaging is normal.  Pituitary is normal in size.  Negative for intracranial hemorrhage.  Following contrast infusion, no enhancing lesions are identified.  Paranasal sinuses are clear.  Vessels at the base of the brain are patent.  IMPRESSION: Negative for metastatic disease.  No acute abnormality.   Original Report Authenticated By: Janeece Riggers, M.D.    Mr Breast Bilateral W Wo Contrast  03/12/2012  *RADIOLOGY REPORT*  Clinical Data: Recently diagnosed invasive mammary carcinoma in the 10 o'clock region of the right breast with a metastatic right axillary lymph node.  BILATERAL BREAST MRI WITH AND WITHOUT CONTRAST  Technique: Multiplanar, multisequence MR images of both breasts were obtained prior to and following the intravenous administration of 12ml of multihance.  Three dimensional images were evaluated at the independent DynaCad workstation.  Comparison:  Mammograms dated 03/05/2012, 03/01/2012, 03/01/2011 and 02/26/2010 from [facility].  Findings: There  is a moderate background parenchymal enhancement pattern.  Right breast: 1.  There is a lobulated 2.2 x 1.7 x 1.8 cm mass in the posterior third of the upper outer quadrant of the right breast.  There is a post biopsy clip artifact 1 cm lateral to the mass. 2.  In the anterior third upper outer quadrant of the right breast there is patchy non mass like enhancement measuring 0.8 x 0.3 x 0.3 cm.  Left breast:  There is no abnormal enhancement  the left breast.  There are enlarged right axillary lymph nodes.  The largest right axillary lymph node measures 2.4 cm and is located lateral to the pectoralis minor consistent with level I adenopathy.  There is a 1.6 cm lymph node behind the pectoralis minor consistent with enlarged level II adenopathy.  IMPRESSION:  1.  2.2 cm mass in the posterior third of the upper outer quadrant of the right breast corresponding with the known invasive mammary carcinoma. 2.  Patchy non mass-like enhancement in the anterior third of the upper outer quadrant of the right breast.  MR guided core biopsy is recommended. 3. Enlarged level I and II right axillary adenopathy.  RECOMMENDATION: MR guided core biopsy of the right breast.  THREE-DIMENSIONAL MR IMAGE RENDERING ON INDEPENDENT WORKSTATION:  Three-dimensional MR images were rendered by post-processing of the original MR data on an independent workstation.  The three- dimensional MR images were interpreted, and findings were reported in the accompanying complete MRI report for this study.  BI-RADS CATEGORY 4:  Suspicious abnormality - biopsy should be considered.   Original Report Authenticated By: Baird Lyons, M.D.    Mr Biopsy/wire Localization  03/20/2012  *RADIOLOGY REPORT*  Clinical Data:  Known right breast carcinoma.  Additional area of worrisome enhancement located within the anterior upper outer quadrant of the right breast.  MRI GUIDED VACUUM ASSISTED BIOPSY OF THE RIGHT BREAST WITHOUT AND WITH CONTRAST  Comparison: Previous  exams.  Technique: Multiplanar, multisequence MR images of the right breast were obtained prior to and following the intravenous administration of 12 ml of Mulithance.  I met with the patient, and we discussed the procedure of MRI guided biopsy, including risks, benefits, and alternatives. Specifically, we discussed the risks of infection, bleeding, tissue injury, clip migration, and inadequate sampling.  Informed, written consent was given.  Using sterile technique, 2% Lidocaine, MRI guidance, and a 9 gauge vacuum assisted device, biopsy was performed of the area of enhancement located within the anterior portion of the upper-outer quadrant - right breast using a lateral approach.  At the conclusion of the procedure, a  tissue marker clip was deployed into the biopsy cavity.  IMPRESSION: MRI guided biopsy of the area of enhancement located within the anterior portion of the upper-outer quadrant of the right breast as discussed above. No apparent complications.  BI-RADS CATEGORY 6:  Known biopsy-proven malignancy - appropriate action should be taken.  THREE-DIMENSIONAL MR IMAGE RENDERING ON INDEPENDENT WORKSTATION:  Three-dimensional MR images were rendered by post-processing of the original MR data on an independent workstation.  The three- dimensional MR images were interpreted, and findings were reported in the accompanying complete MRI report for this study.  The pathology associated with the right breast MR guided core biopsy demonstrated invasive mammary carcinoma and in situ mammary carcinoma.  This is concordant with imaging findings.  I have discussed findings with the patient by telephone and answered her questions.  The patient states that biopsy site is clean and dry without hematoma formation or signs of infection.  Post biopsy wound care instructions were reviewed with the patient.  Follow-up will be with Dr. Carolynne Edouard and Dr. Welton Flakes regards her treatment plan.  The patient was encouraged to call the [facility] for additional questions or concerns   Original Report Authenticated By: Rolla Plate, M.D.    Nm Pet Image Initial (pi) Skull Base To Thigh  03/22/2012  *RADIOLOGY REPORT*  Clinical Data: Initial treatment strategy for high risk right breast cancer.  NUCLEAR MEDICINE  PET SKULL BASE TO THIGH  Fasting Blood Glucose:  100  Technique:  18.6 mCi F-18 FDG was injected intravenously. CT data was obtained and used for attenuation correction and anatomic localization only.  (This was not acquired as a diagnostic CT examination.) Additional exam technical data entered on technologist worksheet.  Comparison:  MRI breast dated 03/12/2012  Findings:  Neck: No hypermetabolic lymph nodes in the neck.  Chest:  Vague hypermetabolism in the upper/central right breast, max SUV 2.7.  Additional focal hypermetabolism in the upper/outer right breast, max SUV 2.3 (PET image 105).  These findings correspond to biopsy-proven primary breast carcinoma.  1.3 cm short axis right subpectoral lymph node (series 2/image 66), max SUV 3.1.  Right axillary lymphadenopathy measuring up to 1.6 cm short axis (series 2/image 88), max SUV 4.8).  No hypermetabolic mediastinal or hilar nodes.  No suspicious pulmonary nodules on the CT scan.  Abdomen/Pelvis:  No abnormal hypermetabolic activity within the liver, pancreas, adrenal glands, or spleen.  No hypermetabolic lymph nodes in the abdomen or pelvis.  Skeleton:  No focal hypermetabolic activity to suggest skeletal metastasis.  IMPRESSION: Two foci of hypermetabolism in the upper right breast, max SUV 2.7, corresponding to biopsy-proven primary breast carcinoma.  Associated right subpectoral and axillary nodal metastases, max SUV 4.8.  No evidence of distant metastases.   Original Report Authenticated By: Charline Bills, M.D.    Mm Digital Diagnostic Unilat R  03/20/2012  *RADIOLOGY REPORT*  Clinical Data:  Post right breast MR guided core biopsy.  DIGITAL DIAGNOSTIC RIGHT BREAST  MAMMOGRAM  Comparison:  03/05/2012, 03/01/2012, 03/01/2011, 02/26/2010 [facility]ograms [facility].  Findings:  Films are performed following MR guided biopsy of the anterior portion of the upper-outer quadrant of the right breast. The hourglass shaped clip placed during the MR guided core biopsy appears in appropriate position.  IMPRESSION: Appropriate positioning of clip following right breast MR guided core biopsy.   Original Report Authenticated By: Rolla Plate, M.D.     ASSESSMENT: 67 year old [place], [place] woman  #1  status post mastectomy with right axillary lymph node dissection 05/10/2012 for an mpT2 N1, stage stage IIB invasive lobular breast cancer, stage IIB, ER positive, HER-2/neu positive, with an Mib-1 between 5% and 44%  #2 receiving adjuvant chemotherapy consisting of Taxotere, carboplatinum and Herceptin given every 21 days for total of 6 cycles of Taxotere carboplatinum and weekly Herceptin beginning on 06/14/12- 09/27/12  #3 Herceptin to be continued to total one year; most recent echo 09/17/12.  PLAN:  #1 patient will proceed with his final cycle of TCH today 09/27/2012.  #2  She will be referred to radiation oncology for radiation therapy. She will also continue Herceptin every 3 weeks.  #3 she will return in one week's time for followup.  All questions were answered. The patient knows to call the clinic with any problems, questions or concerns. We can certainly see the patient much sooner if necessary.   I spent 25 minutes counseling the patient face to face. The total time spent in the appointment was 30 minutes.  Drue Second, MD Medical/Oncology [facility] [phone] (beeper) [phone] (Office)

## 2012-10-22 ENCOUNTER — Ambulatory Visit: Payer: Medicare Other | Admitting: Radiation Oncology

## 2012-10-24 ENCOUNTER — Ambulatory Visit
Admission: RE | Admit: 2012-10-24 | Discharge: 2012-10-24 | Disposition: A | Discharge status: Home / Self Care | Payer: Medicare Other | Source: Ambulatory Visit | Attending: Radiation Oncology | Admitting: Radiation Oncology

## 2012-10-24 DIAGNOSIS — C50411 Malignant neoplasm of upper-outer quadrant of right female breast: Secondary | ICD-10-CM

## 2012-10-24 NOTE — Progress Notes (Signed)
Simulation verification note: The patient underwent simulation verification for treatment to her right chest wall and regional lymph nodes.  Her isocenter is in good position and the multileaf collimators contoured the treatment volume appropriately. 

## 2012-10-25 ENCOUNTER — Ambulatory Visit (HOSPITAL_BASED_OUTPATIENT_CLINIC_OR_DEPARTMENT_OTHER): Payer: Medicare Other

## 2012-10-25 ENCOUNTER — Encounter: Payer: Self-pay | Admitting: Adult Health

## 2012-10-25 ENCOUNTER — Ambulatory Visit
Admission: RE | Admit: 2012-10-25 | Discharge: 2012-10-25 | Disposition: A | Discharge status: Home / Self Care | Payer: Medicare Other | Source: Ambulatory Visit | Attending: Radiation Oncology | Admitting: Radiation Oncology

## 2012-10-25 ENCOUNTER — Telehealth: Payer: Self-pay | Admitting: Oncology

## 2012-10-25 ENCOUNTER — Telehealth: Payer: Self-pay | Admitting: *Deleted

## 2012-10-25 ENCOUNTER — Other Ambulatory Visit (HOSPITAL_BASED_OUTPATIENT_CLINIC_OR_DEPARTMENT_OTHER): Payer: Medicare Other | Admitting: Lab

## 2012-10-25 ENCOUNTER — Ambulatory Visit (HOSPITAL_BASED_OUTPATIENT_CLINIC_OR_DEPARTMENT_OTHER): Payer: Medicare Other | Admitting: Adult Health

## 2012-10-25 VITALS — BP 129/78 | HR 80 | Temp 98.6°F | Resp 80 | Ht 64.0 in | Wt 141.8 lb

## 2012-10-25 DIAGNOSIS — D539 Nutritional anemia, unspecified: Secondary | ICD-10-CM

## 2012-10-25 DIAGNOSIS — C50419 Malignant neoplasm of upper-outer quadrant of unspecified female breast: Secondary | ICD-10-CM

## 2012-10-25 DIAGNOSIS — Z170 Estrogen receptor positive status [ER+]: Secondary | ICD-10-CM

## 2012-10-25 DIAGNOSIS — C50411 Malignant neoplasm of upper-outer quadrant of right female breast: Secondary | ICD-10-CM

## 2012-10-25 DIAGNOSIS — Z5112 Encounter for antineoplastic immunotherapy: Secondary | ICD-10-CM

## 2012-10-25 DIAGNOSIS — H1013 Acute atopic conjunctivitis, bilateral: Secondary | ICD-10-CM

## 2012-10-25 LAB — COMPREHENSIVE METABOLIC PANEL (CC13)
ALT: 18 U/L (ref 0–55)
AST: 27 U/L (ref 5–34)
Albumin: 3.9 g/dL (ref 3.5–5.0)
Alkaline Phosphatase: 78 U/L (ref 40–150)
BUN: 12.3 mg/dL (ref 7.0–26.0)
CO2: 24 mEq/L (ref 22–29)
Calcium: 9.6 mg/dL (ref 8.4–10.4)
Chloride: 101 mEq/L (ref 98–107)
Creatinine: 0.9 mg/dL (ref 0.6–1.1)
Glucose: 90 mg/dl (ref 70–99)
Potassium: 3.9 mEq/L (ref 3.5–5.1)
Sodium: 135 mEq/L — ABNORMAL LOW (ref 136–145)
Total Bilirubin: 0.4 mg/dL (ref 0.20–1.20)
Total Protein: 7 g/dL (ref 6.4–8.3)

## 2012-10-25 LAB — CBC WITH DIFFERENTIAL/PLATELET
BASO%: 1.9 % (ref 0.0–2.0)
Basophils Absolute: 0.1 10*3/uL (ref 0.0–0.1)
EOS%: 3.1 % (ref 0.0–7.0)
Eosinophils Absolute: 0.1 10*3/uL (ref 0.0–0.5)
HCT: 34.4 % — ABNORMAL LOW (ref 34.8–46.6)
HGB: 11.1 g/dL — ABNORMAL LOW (ref 11.6–15.9)
LYMPH%: 29.8 % (ref 14.0–49.7)
MCH: 32.7 pg (ref 25.1–34.0)
MCHC: 32.3 g/dL (ref 31.5–36.0)
MCV: 101.5 fL — ABNORMAL HIGH (ref 79.5–101.0)
MONO#: 0.6 10*3/uL (ref 0.1–0.9)
MONO%: 13.7 % (ref 0.0–14.0)
NEUT#: 2.1 10*3/uL (ref 1.5–6.5)
NEUT%: 51.5 % (ref 38.4–76.8)
Platelets: 297 10*3/uL (ref 145–400)
RBC: 3.39 10*6/uL — ABNORMAL LOW (ref 3.70–5.45)
RDW: 15.2 % — ABNORMAL HIGH (ref 11.2–14.5)
WBC: 4.2 10*3/uL (ref 3.9–10.3)
lymph#: 1.2 10*3/uL (ref 0.9–3.3)
nRBC: 0 % (ref 0–0)

## 2012-10-25 MED ORDER — OLOPATADINE HCL 0.2 % OP SOLN: 1.0000 [drp] | Freq: Two times a day (BID) | OPHTHALMIC | Status: DC

## 2012-10-25 MED ORDER — SODIUM CHLORIDE 0.9 % IJ SOLN
10.0000 mL | INTRAMUSCULAR | Status: DC | PRN
  Administered 2012-10-25: 10 mL
  Filled 2012-10-25: qty 10

## 2012-10-25 MED ORDER — HEPARIN SOD (PORK) LOCK FLUSH 100 UNIT/ML IV SOLN
500.0000 [IU] | Freq: Once | INTRAVENOUS | Status: AC | PRN
  Administered 2012-10-25: 500 [IU]
  Filled 2012-10-25: qty 5

## 2012-10-25 MED ORDER — DIPHENHYDRAMINE HCL 25 MG PO CAPS
50.0000 mg | ORAL_CAPSULE | Freq: Once | ORAL | Status: AC
  Administered 2012-10-25: 50 mg via ORAL

## 2012-10-25 MED ORDER — ACETAMINOPHEN 325 MG PO TABS
650.0000 mg | ORAL_TABLET | Freq: Once | ORAL | Status: AC
  Administered 2012-10-25: 650 mg via ORAL

## 2012-10-25 MED ORDER — SODIUM CHLORIDE 0.9 % IV SOLN
Freq: Once | INTRAVENOUS | Status: AC
  Administered 2012-10-25: 12:00:00 via INTRAVENOUS

## 2012-10-25 MED ORDER — SODIUM CHLORIDE 0.9 % IV SOLN
6.0000 mg/kg | Freq: Once | INTRAVENOUS | Status: AC
  Administered 2012-10-25: 378 mg via INTRAVENOUS
  Filled 2012-10-25: qty 18

## 2012-10-25 NOTE — Patient Instructions (Addendum)
Doing well.  Proceed with Herceptin.  Please call us if your shortness of breath worsens in any way.

## 2012-10-25 NOTE — Patient Instructions (Addendum)
Trastuzumab injection for infusion What is this medicine? TRASTUZUMAB (tras TOO zoo mab) is a monoclonal antibody. It targets a protein called HER2. This protein is found in some stomach and breast cancers. This medicine can stop cancer cell growth. This medicine may be used with other cancer treatments. This medicine may be used for other purposes; ask your health care provider or pharmacist if you have questions. What should I tell my health care provider before I take this medicine? They need to know if you have any of these conditions: -heart disease -heart failure -infection (especially a virus infection such as chickenpox, cold sores, or herpes) -lung or breathing disease, like asthma -recent or ongoing radiation therapy -an unusual or allergic reaction to trastuzumab, benzyl alcohol, or other medications, foods, dyes, or preservatives -pregnant or trying to get pregnant -breast-feeding How should I use this medicine? This drug is given as an infusion into a vein. It is administered in a hospital or clinic by a specially trained health care professional. Talk to your pediatrician regarding the use of this medicine in children. This medicine is not approved for use in children. Overdosage: If you think you have taken too much of this medicine contact a poison control center or emergency room at once. NOTE: This medicine is only for you. Do not share this medicine with others. What if I miss a dose? It is important not to miss a dose. Call your doctor or health care professional if you are unable to keep an appointment. What may interact with this medicine? -cyclophosphamide -doxorubicin -warfarin This list may not describe all possible interactions. Give your health care provider a list of all the medicines, herbs, non-prescription drugs, or dietary supplements you use. Also tell them if you smoke, drink alcohol, or use illegal drugs. Some items may interact with your medicine. What  should I watch for while using this medicine? Visit your doctor for checks on your progress. Report any side effects. Continue your course of treatment even though you feel ill unless your doctor tells you to stop. Call your doctor or health care professional for advice if you get a fever, chills or sore throat, or other symptoms of a cold or flu. Do not treat yourself. Try to avoid being around people who are sick. You may experience fever, chills and shaking during your first infusion. These effects are usually mild and can be treated with other medicines. Report any side effects during the infusion to your health care professional. Fever and chills usually do not happen with later infusions. What side effects may I notice from receiving this medicine? Side effects that you should report to your doctor or other health care professional as soon as possible: -breathing difficulties -chest pain or palpitations -cough -dizziness or fainting -fever or chills, sore throat -skin rash, itching or hives -swelling of the legs or ankles -unusually weak or tired Side effects that usually do not require medical attention (report to your doctor or other health care professional if they continue or are bothersome): -loss of appetite -headache -muscle aches -nausea This list may not describe all possible side effects. Call your doctor for medical advice about side effects. You may report side effects to FDA at 1-800-FDA-1088. Where should I keep my medicine? This drug is given in a hospital or clinic and will not be stored at home. NOTE: This sheet is a summary. It may not cover all possible information. If you have questions about this medicine, talk to your doctor, pharmacist,   or health care provider.  2013, [employer]. (02/27/2009 1:43:15 PM)  

## 2012-10-25 NOTE — Telephone Encounter (Signed)
Per staff message and POF I have scheduled appts.  JMW  

## 2012-10-25 NOTE — Progress Notes (Signed)
OFFICE PROGRESS NOTE   Heather Bradford, MD [address] [place] [address] Dr. Chevis Pretty Dr. Chipper Herb  DIAGNOSIS: 67 year old female with invasive lobular breast cancer, stage IIB, receiving adjuvant chemotherapy  PRIOR THERAPY:  #1 patient was originally seen in the multidisciplinary breast clinic on 03/14/2012 for new diagnosis of invasive ductal carcinoma that was HER-2 positive node-positive, ER positive PR negative with Ki-67 of 44%  #2 on MRI patient also had an anterior breast mass that was to be biopsied to decide whether or not she should receive neoadjuvant chemotherapy or adjuvant chemotherapy. Patient's mass was biopsied and it was positive for a malignancy that was HER-2/neu negative.  #3 patient is now status post right mastectomy with axillary lymph node dissection with the final pathology revealing the largest tumor measuring 5 cm, 1.7 cm and 1.6 cm. Tumor was ER positive PR negative HER-2/neu positive with a Ki-67 of 44% 3 of 7 lymph nodes were positive for metastatic disease.  #4 patient will proceed with adjuvant chemotherapy and Herceptin. Chemotherapy will consist of Taxotere carboplatinum given every 21 days with weekly Herceptin. Total of 6 cycles of Taxotere carboplatin will be administered starting on 06/14/2012. Once patient completes her chemotherapy then she will go on to receive radiation therapy along with Herceptin with the Herceptin then being converted to every 3 weeks to finish a total of one year. Because patient's tumor is ER positive she will also be given antiestrogen therapy adjuvantly.  CURRENT THERAPY: Every 3 week Herceptin and radiation therapy  INTERVAL HISTORY: Heather [name] is here for f/u prior to her adjuvant treatment of her right breast cancer.  She is feeling moderately well today.  She went to physical therapy and her arms were equally swollen.  They are working on getting her a glove due to her right hand continuing to feel  swollen.  She started her first dose of radiation today.  She was mildly short of breath after the herceptin last week, she is also concerned b/c she feels her legs are swollen, her legs also normally get swollen in the heat.  She denies orthopnea, PND.  She is increasing her activity due to deconditioning and wanting to regain her strength and stamina.  Otherwise, a 10 point ROS  MEDICAL HISTORY: Past Medical History  Diagnosis Date  . Chronic kidney disease   . Hx: UTI (urinary tract infection)   . Right shoulder pain   . Right knee pain   . Hypertension     recently started Aldactone   . Hyperlipidemia     but not on meds;diet and exercise controlled  . Dizziness     has been going on for 17months;medical MD aware  . H/O hiatal hernia   . GERD (gastroesophageal reflux disease)     Tums prn  . Hemorrhoids   . Urinary frequency     d/t taking Aldactone  . History of UTI   . Anemia     hx of  . Cataract     immature-not sure of which eye  . Anxiety     r/t updated surgery  . Insomnia     takes Melatoniin daily  . Complication of anesthesia     pt states she is sensitive to meds  . Breast cancer     right  . Skin cancer     ALLERGIES:  is allergic to codeine; keflex; naprosyn; and tussin.  MEDICATIONS:  Current Outpatient Prescriptions  Medication Sig Dispense Refill  . B  Complex-C (SUPER B COMPLEX PO) Take 1 each by mouth daily.      . calcium carbonate (TUMS - DOSED IN MG ELEMENTAL CALCIUM) 500 MG chewable tablet Chew 1 tablet by mouth daily.      . Cholecalciferol (VITAMIN D-3 PO) Take 4,000 Units by mouth daily.      . clindamycin (CLINDAGEL) 1 % gel Apply topically 2 (two) times daily.  30 g  0  . dexamethasone (DECADRON) 4 MG tablet Take 2 tablets (8 mg total) by mouth 2 (two) times daily with a meal. Take two times a day the day before Taxotere. Then take two times a day starting the day after chemo for 3 days.  36 tablet  5  . doxycycline (VIBRA-TABS) 100 MG  tablet Take 1 tablet (100 mg total) by mouth 2 (two) times daily.  20 tablet  0  . gabapentin (NEURONTIN) 100 MG capsule Take 1 capsule (100 mg total) by mouth 3 (three) times daily.  90 capsule  2  . lidocaine-prilocaine (EMLA) cream Apply topically as needed.  30 g  7  . Loratadine (CLARITIN PO) Take 1 each by mouth daily.      . magnesium gluconate (MAGONATE) 500 MG tablet Take 500 mg by mouth 2 (two) times daily.      Marland Kitchen omeprazole (PRILOSEC) 40 MG capsule Take 1 capsule (40 mg total) by mouth daily.  30 capsule  6  . PREVIDENT 5000 BOOSTER PLUS 1.1 % PSTE Take 1 application by mouth Twice daily.      . Probiotic Product (PROBIOTIC DAILY PO) Take 1 each by mouth.      . spironolactone (ALDACTONE) 25 MG tablet Take 0.5 tablets (12.5 mg total) by mouth daily.  30 tablet  3   No current facility-administered medications for this visit.   Facility-Administered Medications Ordered in Other Visits  Medication Dose Route Frequency Provider Last Rate Last Dose  . sodium chloride 0.9 % injection 10 mL  10 mL Intracatheter PRN Victorino December, MD   10 mL at 10/25/12 1255    SURGICAL HISTORY:  Past Surgical History  Procedure Laterality Date  . Cyst removed from left breast  1970  . Left wrist surgery   2004    with plate  . Colonoscopy    . Esophagogastroduodenoscopy    . Mastectomy, radical    . Mastectomy modified radical  05/10/2012    Procedure: MASTECTOMY MODIFIED RADICAL;  Surgeon: Robyne Askew, MD;  Location: [facility] OR;  Service: General;  Laterality: Right;  RIGHT MODIFIED RADICAL MASTECTOMY  . Portacath placement  05/10/2012    Procedure: INSERTION PORT-A-CATH;  Surgeon: Robyne Askew, MD;  Location: [facility] OR;  Service: General;  Laterality: Left;    REVIEW OF SYSTEMS:  General: fatigue (+), night sweats (-), fever (-), pain (-) Lymph: palpable nodes (-) HEENT: vision changes (-), mucositis (-), gum bleeding (-), epistaxis (-) Cardiovascular: chest pain (-), palpitations (-) Pulmonary:  shortness of breath (-), dyspnea on exertion (-), cough (-), hemoptysis (-) GI:  Early satiety (-), melena (-), dysphagia (-), nausea/vomiting (-), diarrhea (-) GU: dysuria (-), hematuria (-), incontinence (-) Musculoskeletal: joint swelling (-), joint pain (-), back pain (-) Neuro: weakness (-), numbness (+), headache (-), confusion (-) Skin: Rash (-), lesions (-), dryness (-) Psych: depression (-), suicidal/homicidal ideation (-), feeling of hopelessness (-)  PHYSICAL EXAMINATION: Blood pressure 129/78, pulse 80, temperature 98.6 F (37 C), temperature source Oral, resp. rate 80, height 5\' 4"  (1.626  m), weight 141 lb 12.8 oz (64.32 kg). Body mass index is 24.33 kg/(m^2). General: Patient is a well appearing female in no acute distress HEENT: PERRLA, sclerae anicteric no conjunctival pallor, MMM Neck: supple, no palpable adenopathy Lungs: clear to auscultation bilaterally, no wheezes, rhonchi, or rales Cardiovascular: regular rate rhythm, S1, S2, no murmurs, rubs or gallops Abdomen: Soft, non-tender, non-distended, normoactive bowel sounds, no HSM Extremities: warm and well perfused, no clubbing, cyanosis, or edema Skin: No rashes or lesions, pustular rash on scalp Neuro: Non-focal Breasts: The right breast is status post mastectomy. There is no evidence of local recurrence. The right axilla is benign. The left breast is unremarkable. ECOG PERFORMANCE STATUS: 1   LABORATORY DATA: Lab Results  Component Value Date   WBC 4.2 10/25/2012   HGB 11.1* 10/25/2012   HCT 34.4* 10/25/2012   MCV 101.5* 10/25/2012   PLT 297 10/25/2012      Chemistry      Component Value Date/Time   NA 135* 10/25/2012 0946   NA 138 05/11/2012 0640   K 3.9 10/25/2012 0946   K 4.7 05/11/2012 0640   CL 101 10/25/2012 0946   CL 99 05/11/2012 0640   CO2 24 10/25/2012 0946   CO2 20 05/11/2012 0640   BUN 12.3 10/25/2012 0946   BUN 12 05/11/2012 0640   CREATININE 0.9 10/25/2012 0946   CREATININE 0.88 05/11/2012 0640       Component Value Date/Time   CALCIUM 9.6 10/25/2012 0946   CALCIUM 9.7 05/11/2012 0640   ALKPHOS 78 10/25/2012 0946   AST 27 10/25/2012 0946   ALT 18 10/25/2012 0946   BILITOT 0.40 10/25/2012 0946     ADDITIONAL INFORMATION: CHROMOGENIC IN-SITU HYBRIDIZATION (1C - TUMOR WITH CLIP) Interpretation HER-2/NEU BY CISH - NO AMPLIFICATION OF HER-2 DETECTED. THE RATIO OF HER-2: CEP 17 SIGNALS WAS 1.25. Reference range: Ratio: HER2:CEP17 < 1.8 - gene amplification not observed Ratio: HER2:CEP 17 1.8-2.2 - equivocal result Ratio: HER2:CEP17 > 2.2 - gene amplification observed CHROMOGENIC IN-SITU HYBRIDIZATION (1E - TUMOR WITHOUT CLIP) Interpretation: HER2/NEU BY CISH - SHOWS AMPLIFICATION BY CISH ANALYSIS. THE RATIO OF HER2: CEP 17 SIGNALS WAS 2.83. Reference range: Ratio: HER2:CEP17 < 1.8 gene amplification not observed Ratio: HER2:CEP 17 1.8-2.2 - equivocal result Ratio: HER2:CEP17 > 2.2 - gene amplification observed Pecola Leisure MD Pathologist, Electronic Signature ( Signed 05/15/2012) FINAL DIAGNOSIS Diagnosis Breast, modified radical mastectomy , right, with axillary contents 1 of 4 FINAL for Heather [name], Heather S ([id number]) Diagnosis(continued) TUMOR #1 (WITH CLIP): - INVASIVE LOBULAR CARCINOMA, GRADE II, SEE COMMENT. - LYMPHOVASCULAR INVASION IDENTIFIED. - PREVIOUS BIOPSY SITE IDENTIFIED. TUMOR #2 (NO CLIP): - INVASIVE LOBULAR CARCINOMA, GRADE II, SEE COMMENT. - LOBULAR CARCINOMA IN SITU. - LYMPHOVASCULAR INVASION IDENTIFIED. - THREE LYMPH NODES, POSITIVE FOR METASTATIC MAMMARY CARCINOMA (3/7). - TUMOR DEPOSITS ARE 1.7, 1.6, AND 5 CM. - EXTRACAPSULAR TUMOR EXTENSION IDENTIFIED. - TWO LYMPH NODES, POSITIVE FOR ISOLATED TUMOR CELLS. Microscopic Comment BREAST, INVASIVE TUMOR, WITH LYMPH NODE SAMPLING Specimen, including laterality: Right breast Procedure: Modified radical mastectomy Grade: Tumor 1 and tumor 2: Grade II of III Tubule formation: 3 Nuclear pleomorphism: 2 Mitotic:  1 Tumor size (gross measurement: Tumor #1- 2.3 cm; tumor #2 - 2.4 cm Margins: Invasive, distance to closest margin: Tumor #1 2.7 cm - deep margin; tumor #2 - 1.6 cm - deep margin Lymphovascular invasion: Present Ductal carcinoma in situ: Absent Grade: N/A Extensive intraductal component: N/A Lobular neoplasia: Both atypical hyperplasia and in situ carcinoma Extent of tumor: Skin: Grossly  negative Nipple: Grossly negative Skeletal muscle: Microscopically negative Lymph nodes: # examined: 7 Lymph nodes with metastasis: 3 Isolated tumor cells (< 0.2 mm): two lymph nodes (Slide 1P and 1O) Micrometastasis: (> 0.2 mm and < 2.0 mm): none Macrometastasis: (> 2.0 mm): 1.7 cm , 1.6 cm and 5 cm Extracapsular extension: Present Breast prognostic profile: Tumor #1: Estrogen receptor: Not repeated, previous study demonstrated 98% positivity ([id number]) Progesterone receptor: Not repeated, previous study demonstrated 0% positivity ([id number]) Her 2 neu: Repeated, previous study demonstrated no amplification (1.38) ([id number]) Ki-67: Not repeated, previous study demonstrated 5% proliferation rate ([id number]) Tumor #2: Estrogen receptor: Not repeated, previous study demonstrated 97% positivity ([id number]) Progesterone receptor: Not repeated, previous study demonstrated 0% positivity ([id number]) Her 2 neu: Repeated, previous study demonstrated amplification (3.65) ([id number]) Ki-67: Not repeated, previous study demonstrated 44% proliferation rate ([id number]) 2 of 4 FINAL for Heather [name], Heather [name] ([id number]) Microscopic Comment(continued) Non-neoplastic breast: Benign fibrocystic change with usual ductal hyperplasia, sclerosing adenosis, benign radial sclerosing lesion, and microcalcifications in benign ducts and lobules. TNM: mpT2, pN1a, pMX. Comments: none  RADIOGRAPHIC STUDIES:  Mr Lodema Pilot Contrast  03/23/2012  *RADIOLOGY REPORT*  Clinical Data: New diagnosis breast cancer.   Rule out metastatic disease.  MRI HEAD WITHOUT AND WITH CONTRAST  Technique:  Multiplanar, multiecho pulse sequences of the brain and surrounding structures were obtained according to standard protocol without and with intravenous contrast  Contrast: 12mL MULTIHANCE GADOBENATE DIMEGLUMINE 529 MG/ML IV SOLN  Comparison: None.  Findings: Ventricle size is normal.  Small 4 mm hyperintensities in the cerebral white matter bilaterally do not enhance and are most likely due to chronic microvascular ischemia.  Negative for acute infarct.  Diffusion weighted imaging is normal.  Pituitary is normal in size.  Negative for intracranial hemorrhage.  Following contrast infusion, no enhancing lesions are identified.  Paranasal sinuses are clear.  Vessels at the base of the brain are patent.  IMPRESSION: Negative for metastatic disease.  No acute abnormality.   Original Report Authenticated By: Janeece Riggers, M.D.    Mr Breast Bilateral W Wo Contrast  03/12/2012  *RADIOLOGY REPORT*  Clinical Data: Recently diagnosed invasive mammary carcinoma in the 10 o'clock region of the right breast with a metastatic right axillary lymph node.  BILATERAL BREAST MRI WITH AND WITHOUT CONTRAST  Technique: Multiplanar, multisequence MR images of both breasts were obtained prior to and following the intravenous administration of 12ml of multihance.  Three dimensional images were evaluated at the independent DynaCad workstation.  Comparison:  Mammograms dated 03/05/2012, 03/01/2012, 03/01/2011 and 02/26/2010 from [facility].  Findings: There is a moderate background parenchymal enhancement pattern.  Right breast: 1.  There is a lobulated 2.2 x 1.7 x 1.8 cm mass in the posterior third of the upper outer quadrant of the right breast.  There is a post biopsy clip artifact 1 cm lateral to the mass. 2.  In the anterior third upper outer quadrant of the right breast there is patchy non mass like enhancement measuring 0.8 x 0.3 x 0.3 cm.  Left  breast:  There is no abnormal enhancement the left breast.  There are enlarged right axillary lymph nodes.  The largest right axillary lymph node measures 2.4 cm and is located lateral to the pectoralis minor consistent with level I adenopathy.  There is a 1.6 cm lymph node behind the pectoralis minor consistent with enlarged level II adenopathy.  IMPRESSION:  1.  2.2 cm mass in the posterior third of the upper outer quadrant of  the right breast corresponding with the known invasive mammary carcinoma. 2.  Patchy non mass-like enhancement in the anterior third of the upper outer quadrant of the right breast.  MR guided core biopsy is recommended. 3. Enlarged level I and II right axillary adenopathy.  RECOMMENDATION: MR guided core biopsy of the right breast.  THREE-DIMENSIONAL MR IMAGE RENDERING ON INDEPENDENT WORKSTATION:  Three-dimensional MR images were rendered by post-processing of the original MR data on an independent workstation.  The three- dimensional MR images were interpreted, and findings were reported in the accompanying complete MRI report for this study.  BI-RADS CATEGORY 4:  Suspicious abnormality - biopsy should be considered.   Original Report Authenticated By: Baird Lyons, M.D.    Mr Biopsy/wire Localization  03/20/2012  *RADIOLOGY REPORT*  Clinical Data:  Known right breast carcinoma.  Additional area of worrisome enhancement located within the anterior upper outer quadrant of the right breast.  MRI GUIDED VACUUM ASSISTED BIOPSY OF THE RIGHT BREAST WITHOUT AND WITH CONTRAST  Comparison: Previous exams.  Technique: Multiplanar, multisequence MR images of the right breast were obtained prior to and following the intravenous administration of 12 ml of Mulithance.  I met with the patient, and we discussed the procedure of MRI guided biopsy, including risks, benefits, and alternatives. Specifically, we discussed the risks of infection, bleeding, tissue injury, clip migration, and inadequate  sampling.  Informed, written consent was given.  Using sterile technique, 2% Lidocaine, MRI guidance, and a 9 gauge vacuum assisted device, biopsy was performed of the area of enhancement located within the anterior portion of the upper-outer quadrant - right breast using a lateral approach.  At the conclusion of the procedure, a  tissue marker clip was deployed into the biopsy cavity.  IMPRESSION: MRI guided biopsy of the area of enhancement located within the anterior portion of the upper-outer quadrant of the right breast as discussed above. No apparent complications.  BI-RADS CATEGORY 6:  Known biopsy-proven malignancy - appropriate action should be taken.  THREE-DIMENSIONAL MR IMAGE RENDERING ON INDEPENDENT WORKSTATION:  Three-dimensional MR images were rendered by post-processing of the original MR data on an independent workstation.  The three- dimensional MR images were interpreted, and findings were reported in the accompanying complete MRI report for this study.  The pathology associated with the right breast MR guided core biopsy demonstrated invasive mammary carcinoma and in situ mammary carcinoma.  This is concordant with imaging findings.  I have discussed findings with the patient by telephone and answered her questions.  The patient states that biopsy site is clean and dry without hematoma formation or signs of infection.  Post biopsy wound care instructions were reviewed with the patient.  Follow-up will be with Dr. Carolynne Edouard and Dr. Welton Flakes regards her treatment plan.  The patient was encouraged to call the Breast Center for additional questions or concerns   Original Report Authenticated By: Rolla Plate, M.D.    Nm Pet Image Initial (pi) Skull Base To Thigh  03/22/2012  *RADIOLOGY REPORT*  Clinical Data: Initial treatment strategy for high risk right breast cancer.  NUCLEAR MEDICINE PET SKULL BASE TO THIGH  Fasting Blood Glucose:  100  Technique:  18.6 mCi F-18 FDG was injected intravenously. CT  data was obtained and used for attenuation correction and anatomic localization only.  (This was not acquired as a diagnostic CT examination.) Additional exam technical data entered on technologist worksheet.  Comparison:  MRI breast dated 03/12/2012  Findings:  Neck: No hypermetabolic lymph nodes in the neck.  Chest:  Vague hypermetabolism in the upper/central right breast, max SUV 2.7.  Additional focal hypermetabolism in the upper/outer right breast, max SUV 2.3 (PET image 105).  These findings correspond to biopsy-proven primary breast carcinoma.  1.3 cm short axis right subpectoral lymph node (series 2/image 66), max SUV 3.1.  Right axillary lymphadenopathy measuring up to 1.6 cm short axis (series 2/image 88), max SUV 4.8).  No hypermetabolic mediastinal or hilar nodes.  No suspicious pulmonary nodules on the CT scan.  Abdomen/Pelvis:  No abnormal hypermetabolic activity within the liver, pancreas, adrenal glands, or spleen.  No hypermetabolic lymph nodes in the abdomen or pelvis.  Skeleton:  No focal hypermetabolic activity to suggest skeletal metastasis.  IMPRESSION: Two foci of hypermetabolism in the upper right breast, max SUV 2.7, corresponding to biopsy-proven primary breast carcinoma.  Associated right subpectoral and axillary nodal metastases, max SUV 4.8.  No evidence of distant metastases.   Original Report Authenticated By: Charline Bills, M.D.    Mm Digital Diagnostic Unilat R  03/20/2012  *RADIOLOGY REPORT*  Clinical Data:  Post right breast MR guided core biopsy.  DIGITAL DIAGNOSTIC RIGHT BREAST MAMMOGRAM  Comparison:  03/05/2012, 03/01/2012, 03/01/2011, 02/26/2010 mammograms from [facility] imaging.  Findings:  Films are performed following MR guided biopsy of the anterior portion of the upper-outer quadrant of the right breast. The hourglass shaped clip placed during the MR guided core biopsy appears in appropriate position.  IMPRESSION: Appropriate positioning of clip following right breast MR  guided core biopsy.   Original Report Authenticated By: Rolla Plate, M.D.     ASSESSMENT: 67 year old [place], [place] woman  #1  status post mastectomy with right axillary lymph node dissection 05/10/2012 for an mpT2 N1, stage stage IIB invasive lobular breast cancer, stage IIB, ER positive, HER-2/neu positive, with an Mib-1 between 5% and 44%  #2 receiving adjuvant chemotherapy consisting of Taxotere, carboplatinum and Herceptin given every 21 days for total of 6 cycles of Taxotere carboplatinum and weekly Herceptin beginning on 06/14/12.  #3 Herceptin to be continued to total one year; most recent echo 09/17/12.  PLAN:   1. Ms. Gose is doing well. She will proceed with Herceptin and continue daily radiation   2. She will continue to take Super B complex daily. She declined Neurongtin therapy.  3. Ms. Nutting will return in three weeks for her next Herceptin therapy.  At the end of the appointment she was c/o watering/itchy eyes, I prescribed Pataday eye gtts BID.  Mild injection noted, no purulence or discharge from eyes.  I also recommended warm compresses BID.    All questions were answered. The patient knows to call the clinic with any problems, questions or concerns. We can certainly see the patient much sooner if necessary.   I spent 25 minutes counseling the patient face to face. The total time spent in the appointment was 30 minutes.  Cherie Ouch Lyn Hollingshead, NP Medical Oncology [facility] Phone: [phone] 10/25/2012, 2:48 PM

## 2012-10-26 ENCOUNTER — Ambulatory Visit
Admission: RE | Admit: 2012-10-26 | Discharge: 2012-10-26 | Disposition: A | Discharge status: Home / Self Care | Payer: Medicare Other | Source: Ambulatory Visit | Attending: Radiation Oncology | Admitting: Radiation Oncology

## 2012-10-26 DIAGNOSIS — C50411 Malignant neoplasm of upper-outer quadrant of right female breast: Secondary | ICD-10-CM

## 2012-10-26 MED ORDER — RADIAPLEXRX EX GEL
Freq: Once | CUTANEOUS | Status: AC
  Administered 2012-10-26: 15:00:00 via TOPICAL

## 2012-10-26 MED ORDER — ALRA NON-METALLIC DEODORANT (RAD-ONC)
1.0000 "application " | Freq: Once | TOPICAL | Status: AC
  Administered 2012-10-26: 1 via TOPICAL

## 2012-10-26 NOTE — Progress Notes (Signed)
Reviewed routine of clinic  To include doctor assessment every Monday and side effects of radiation to include skin discoloration, tenderness and swelling as well as fatigue.Patient given Radiation Therapy and You booklet.

## 2012-10-29 ENCOUNTER — Encounter: Payer: Self-pay | Admitting: Radiation Oncology

## 2012-10-29 ENCOUNTER — Ambulatory Visit
Admission: RE | Admit: 2012-10-29 | Discharge: 2012-10-29 | Disposition: A | Discharge status: Home / Self Care | Payer: Medicare Other | Source: Ambulatory Visit | Attending: Radiation Oncology | Admitting: Radiation Oncology

## 2012-10-29 VITALS — Resp 18 | Wt 143.2 lb

## 2012-10-29 DIAGNOSIS — C50411 Malignant neoplasm of upper-outer quadrant of right female breast: Secondary | ICD-10-CM

## 2012-10-29 NOTE — Progress Notes (Signed)
Weekly Management Note:  Site: Right chest wall/regional lymph nodes Current Dose:  540  cGy Projected Dose: 5040  cGy followed by right chest wall boost Narrative: The patient is seen today for routine under treatment assessment. CBCT/MVCT images/port films were reviewed. The chart was reviewed.   No new complaints today. She has a followup appointment to see physical therapy this Thursday. She has not yet started to use Radioplex gel.  Physical Examination:  Filed Vitals:   10/29/12 1015  Resp: 18  .  Weight: 143 lb 3.2 oz (64.955 kg). No significant skin changes.  Impression: Tolerating radiation therapy well. She may start Radioplex gel next week. Continue with radiation therapy as planned.  Plan: Continue radiation therapy as planned.

## 2012-10-29 NOTE — Progress Notes (Signed)
Denies pain at this time. Reports right arm continues to be numb. Reports twinges in right breast have returned; explained this is a normal part of healing. Reports skin of breastbone feels tender to touch. Denies skin changes to treatment area. Reports using radiaplex bid as directed. Denies fatigue.

## 2012-10-30 ENCOUNTER — Ambulatory Visit
Admission: RE | Admit: 2012-10-30 | Discharge: 2012-10-30 | Disposition: A | Discharge status: Home / Self Care | Payer: Medicare Other | Source: Ambulatory Visit | Attending: Radiation Oncology | Admitting: Radiation Oncology

## 2012-10-31 ENCOUNTER — Ambulatory Visit
Admission: RE | Admit: 2012-10-31 | Discharge: 2012-10-31 | Disposition: A | Discharge status: Home / Self Care | Payer: Medicare Other | Source: Ambulatory Visit | Attending: Radiation Oncology | Admitting: Radiation Oncology

## 2012-11-01 ENCOUNTER — Ambulatory Visit
Admission: RE | Admit: 2012-11-01 | Discharge: 2012-11-01 | Disposition: A | Discharge status: Home / Self Care | Payer: Medicare Other | Source: Ambulatory Visit | Attending: Radiation Oncology | Admitting: Radiation Oncology

## 2012-11-01 ENCOUNTER — Ambulatory Visit: Payer: Medicare Other | Attending: General Surgery | Admitting: Physical Therapy

## 2012-11-01 ENCOUNTER — Telehealth: Payer: Self-pay | Admitting: *Deleted

## 2012-11-01 NOTE — Telephone Encounter (Signed)
Per staff phone call and pt concern I have scheduled her 8/12 appt..  JMW

## 2012-11-02 ENCOUNTER — Ambulatory Visit
Admission: RE | Admit: 2012-11-02 | Discharge: 2012-11-02 | Disposition: A | Discharge status: Home / Self Care | Payer: Medicare Other | Source: Ambulatory Visit | Attending: Radiation Oncology | Admitting: Radiation Oncology

## 2012-11-05 ENCOUNTER — Ambulatory Visit
Admission: RE | Admit: 2012-11-05 | Discharge: 2012-11-05 | Disposition: A | Discharge status: Home / Self Care | Payer: Medicare Other | Source: Ambulatory Visit | Attending: Radiation Oncology | Admitting: Radiation Oncology

## 2012-11-05 ENCOUNTER — Ambulatory Visit: Payer: Medicare Other

## 2012-11-06 ENCOUNTER — Ambulatory Visit
Admission: RE | Admit: 2012-11-06 | Discharge: 2012-11-06 | Disposition: A | Discharge status: Home / Self Care | Payer: Medicare Other | Source: Ambulatory Visit | Attending: Radiation Oncology | Admitting: Radiation Oncology

## 2012-11-06 ENCOUNTER — Encounter: Payer: Self-pay | Admitting: Radiation Oncology

## 2012-11-06 VITALS — BP 128/60 | HR 78 | Temp 98.5°F | Resp 20 | Wt 141.9 lb

## 2012-11-06 DIAGNOSIS — C50411 Malignant neoplasm of upper-outer quadrant of right female breast: Secondary | ICD-10-CM

## 2012-11-06 NOTE — Progress Notes (Signed)
   Weekly Management Note:  outpatient Current Dose:  16.2 Gy  Projected Dose: 50.4 Gy + boost    Narrative:  The patient presents for routine under treatment assessment.  CBCT/MVCT images/Port film x-rays were reviewed.  The chart was checked. Not using Radiaplex. Minimal skin irritation.  Physical Findings:  weight is 141 lb 14.4 oz (64.365 kg). Her oral temperature is 98.5 F (36.9 C). Her blood pressure is 128/60 and her pulse is 78. Her respiration is 20.  NAD, mild dryness/erythema of right chest wall.  Impression:  The patient is tolerating radiotherapy.  Plan:  Continue radiotherapy as planned. Start using Radiaplex.  ________________________________   Lonie Peak, M.D.

## 2012-11-06 NOTE — Progress Notes (Signed)
Pt denies pain, fatigue, loss of appetite. She has not begun applying Radiaplex to right breast treatment area; advised she may begin applying each day, after treatment and bedtime. Pt verbalized understanding.

## 2012-11-07 ENCOUNTER — Ambulatory Visit
Admission: RE | Admit: 2012-11-07 | Discharge: 2012-11-07 | Disposition: A | Discharge status: Home / Self Care | Payer: Medicare Other | Source: Ambulatory Visit | Attending: Radiation Oncology | Admitting: Radiation Oncology

## 2012-11-08 ENCOUNTER — Ambulatory Visit
Admission: RE | Admit: 2012-11-08 | Discharge: 2012-11-08 | Disposition: A | Discharge status: Home / Self Care | Payer: Medicare Other | Source: Ambulatory Visit | Attending: Radiation Oncology | Admitting: Radiation Oncology

## 2012-11-12 ENCOUNTER — Ambulatory Visit: Payer: Medicare Other | Attending: General Surgery | Admitting: Physical Therapy

## 2012-11-12 ENCOUNTER — Ambulatory Visit
Admission: RE | Admit: 2012-11-12 | Discharge: 2012-11-12 | Disposition: A | Discharge status: Home / Self Care | Payer: Medicare Other | Source: Ambulatory Visit | Attending: Radiation Oncology | Admitting: Radiation Oncology

## 2012-11-12 ENCOUNTER — Encounter: Payer: Self-pay | Admitting: Radiation Oncology

## 2012-11-12 VITALS — BP 129/73 | HR 73 | Resp 18 | Wt 142.8 lb

## 2012-11-12 DIAGNOSIS — N6482 Hypoplasia of breast: Secondary | ICD-10-CM | POA: Insufficient documentation

## 2012-11-12 DIAGNOSIS — M24519 Contracture, unspecified shoulder: Secondary | ICD-10-CM | POA: Insufficient documentation

## 2012-11-12 DIAGNOSIS — C50411 Malignant neoplasm of upper-outer quadrant of right female breast: Secondary | ICD-10-CM

## 2012-11-12 DIAGNOSIS — Z9010 Acquired absence of unspecified breast and nipple: Secondary | ICD-10-CM | POA: Insufficient documentation

## 2012-11-12 DIAGNOSIS — IMO0001 Reserved for inherently not codable concepts without codable children: Secondary | ICD-10-CM | POA: Insufficient documentation

## 2012-11-12 DIAGNOSIS — I890 Lymphedema, not elsewhere classified: Secondary | ICD-10-CM | POA: Insufficient documentation

## 2012-11-12 NOTE — Progress Notes (Signed)
Minimal skin changes under right axilla noted. Hyperpigmentation without desquamation of right axilla noted. Radiation dermatitis beginning on right upper chest. Encouraged patient to continue to use radiaplex bid. Also advised patient to apply hydrocortisone 1% to area of dermatitis to relieve itching. Reports only mild fatigue.

## 2012-11-12 NOTE — Progress Notes (Signed)
Weekly Management Note:  Site: Right chest wall/regional lymph nodes Current Dose:  2160  cGy Projected Dose: 5040  cGy followed by chest wall  Narrative: The patient is seen today for routine under treatment assessment. CBCT/MVCT images/port films were reviewed. The chart was reviewed.   She does report pruritus along her upper chest wall. She has been using Radioplex gel. She also reports mild fatigue.  Physical Examination:  Filed Vitals:   11/12/12 1016  BP: 129/73  Pulse: 73  Resp: 18  .  Weight: 142 lb 12.8 oz (64.774 kg). There is mild erythema along her right chest wall and lymph node regions. No areas of desquamation.  Impression: Tolerating radiation therapy well. I told her she may use hydrocortisone cream when necessary for pruritus.  Plan: Continue radiation therapy as planned.

## 2012-11-13 ENCOUNTER — Ambulatory Visit
Admission: RE | Admit: 2012-11-13 | Discharge: 2012-11-13 | Disposition: A | Discharge status: Home / Self Care | Payer: Medicare Other | Source: Ambulatory Visit | Attending: Radiation Oncology | Admitting: Radiation Oncology

## 2012-11-14 ENCOUNTER — Ambulatory Visit
Admission: RE | Admit: 2012-11-14 | Discharge: 2012-11-14 | Disposition: A | Discharge status: Home / Self Care | Payer: Medicare Other | Source: Ambulatory Visit | Attending: Radiation Oncology | Admitting: Radiation Oncology

## 2012-11-15 ENCOUNTER — Ambulatory Visit (HOSPITAL_BASED_OUTPATIENT_CLINIC_OR_DEPARTMENT_OTHER): Payer: Medicare Other

## 2012-11-15 ENCOUNTER — Ambulatory Visit
Admission: RE | Admit: 2012-11-15 | Discharge: 2012-11-15 | Disposition: A | Discharge status: Home / Self Care | Payer: Medicare Other | Source: Ambulatory Visit | Attending: Radiation Oncology | Admitting: Radiation Oncology

## 2012-11-15 ENCOUNTER — Ambulatory Visit (HOSPITAL_BASED_OUTPATIENT_CLINIC_OR_DEPARTMENT_OTHER): Payer: Medicare Other | Admitting: Adult Health

## 2012-11-15 ENCOUNTER — Encounter: Payer: Self-pay | Admitting: Adult Health

## 2012-11-15 ENCOUNTER — Telehealth: Payer: Self-pay | Admitting: *Deleted

## 2012-11-15 ENCOUNTER — Other Ambulatory Visit (HOSPITAL_BASED_OUTPATIENT_CLINIC_OR_DEPARTMENT_OTHER): Payer: Medicare Other | Admitting: Lab

## 2012-11-15 VITALS — BP 137/74 | HR 72 | Temp 98.1°F | Resp 20 | Ht 64.0 in | Wt 142.9 lb

## 2012-11-15 DIAGNOSIS — M7989 Other specified soft tissue disorders: Secondary | ICD-10-CM

## 2012-11-15 DIAGNOSIS — C50411 Malignant neoplasm of upper-outer quadrant of right female breast: Secondary | ICD-10-CM

## 2012-11-15 DIAGNOSIS — D539 Nutritional anemia, unspecified: Secondary | ICD-10-CM

## 2012-11-15 DIAGNOSIS — Z5112 Encounter for antineoplastic immunotherapy: Secondary | ICD-10-CM

## 2012-11-15 DIAGNOSIS — C773 Secondary and unspecified malignant neoplasm of axilla and upper limb lymph nodes: Secondary | ICD-10-CM

## 2012-11-15 DIAGNOSIS — C50419 Malignant neoplasm of upper-outer quadrant of unspecified female breast: Secondary | ICD-10-CM

## 2012-11-15 DIAGNOSIS — Z170 Estrogen receptor positive status [ER+]: Secondary | ICD-10-CM

## 2012-11-15 LAB — CBC WITH DIFFERENTIAL/PLATELET
BASO%: 1.4 % (ref 0.0–2.0)
Basophils Absolute: 0.1 10*3/uL (ref 0.0–0.1)
EOS%: 10.1 % — ABNORMAL HIGH (ref 0.0–7.0)
Eosinophils Absolute: 0.4 10*3/uL (ref 0.0–0.5)
HCT: 37 % (ref 34.8–46.6)
HGB: 12.1 g/dL (ref 11.6–15.9)
LYMPH%: 22.6 % (ref 14.0–49.7)
MCH: 31.3 pg (ref 25.1–34.0)
MCHC: 32.7 g/dL (ref 31.5–36.0)
MCV: 95.9 fL (ref 79.5–101.0)
MONO#: 0.5 10*3/uL (ref 0.1–0.9)
MONO%: 12.3 % (ref 0.0–14.0)
NEUT#: 2 10*3/uL (ref 1.5–6.5)
NEUT%: 53.6 % (ref 38.4–76.8)
Platelets: 117 10*3/uL — ABNORMAL LOW (ref 145–400)
RBC: 3.86 10*6/uL (ref 3.70–5.45)
RDW: 13.5 % (ref 11.2–14.5)
WBC: 3.7 10*3/uL — ABNORMAL LOW (ref 3.9–10.3)
lymph#: 0.8 10*3/uL — ABNORMAL LOW (ref 0.9–3.3)
nRBC: 0 % (ref 0–0)

## 2012-11-15 LAB — VITAMIN B12: Vitamin B-12: 760 pg/mL (ref 211–911)

## 2012-11-15 LAB — COMPREHENSIVE METABOLIC PANEL (CC13)
ALT: 21 U/L (ref 0–55)
AST: 30 U/L (ref 5–34)
Albumin: 4.1 g/dL (ref 3.5–5.0)
Alkaline Phosphatase: 83 U/L (ref 40–150)
BUN: 13.7 mg/dL (ref 7.0–26.0)
CO2: 25 mEq/L (ref 22–29)
Calcium: 9.9 mg/dL (ref 8.4–10.4)
Chloride: 103 mEq/L (ref 98–109)
Creatinine: 1 mg/dL (ref 0.6–1.1)
Glucose: 94 mg/dl (ref 70–140)
Potassium: 4 mEq/L (ref 3.5–5.1)
Sodium: 138 mEq/L (ref 136–145)
Total Bilirubin: 0.4 mg/dL (ref 0.20–1.20)
Total Protein: 7.3 g/dL (ref 6.4–8.3)

## 2012-11-15 LAB — FOLATE: Folate: >20 ng/mL

## 2012-11-15 MED ORDER — SODIUM CHLORIDE 0.9 % IV SOLN
Freq: Once | INTRAVENOUS | Status: AC
  Administered 2012-11-15: 11:00:00 via INTRAVENOUS

## 2012-11-15 MED ORDER — HEPARIN SOD (PORK) LOCK FLUSH 100 UNIT/ML IV SOLN
500.0000 [IU] | Freq: Once | INTRAVENOUS | Status: AC | PRN
  Administered 2012-11-15: 500 [IU]
  Filled 2012-11-15: qty 5

## 2012-11-15 MED ORDER — TRASTUZUMAB CHEMO INJECTION 440 MG
6.0000 mg/kg | Freq: Once | INTRAVENOUS | Status: AC
  Administered 2012-11-15: 378 mg via INTRAVENOUS
  Filled 2012-11-15: qty 18

## 2012-11-15 MED ORDER — SODIUM CHLORIDE 0.9 % IJ SOLN
10.0000 mL | INTRAMUSCULAR | Status: DC | PRN
  Administered 2012-11-15: 10 mL
  Filled 2012-11-15: qty 10

## 2012-11-15 MED ORDER — DIPHENHYDRAMINE HCL 25 MG PO CAPS
50.0000 mg | ORAL_CAPSULE | Freq: Once | ORAL | Status: AC
  Administered 2012-11-15: 25 mg via ORAL

## 2012-11-15 MED ORDER — ACETAMINOPHEN 325 MG PO TABS
650.0000 mg | ORAL_TABLET | Freq: Once | ORAL | Status: AC
  Administered 2012-11-15: 650 mg via ORAL

## 2012-11-15 NOTE — Telephone Encounter (Signed)
Printed the pt an avs. sw Heather @ Dr. Gala Romney office and she stated that she is going to speak w/ the doctor and call the pt @ home , and give her an appt....td

## 2012-11-15 NOTE — Patient Instructions (Addendum)
2 Gram Low Sodium Diet A 2 gram sodium diet restricts the amount of sodium in the diet to no more than 2 g or 2000 mg daily. Limiting the amount of sodium is often used to help lower blood pressure. It is important if you have heart, liver, or kidney problems. Many foods contain sodium for flavor and sometimes as a preservative. When the amount of sodium in a diet needs to be low, it is important to know what to look for when choosing foods and drinks. The following includes some information and guidelines to help make it easier for you to adapt to a low sodium diet. QUICK TIPS  Do not add salt to food.  Avoid convenience items and fast food.  Choose unsalted snack foods.  Buy lower sodium products, often labeled as "lower sodium" or "no salt added."  Check food labels to learn how much sodium is in 1 serving.  When eating at a restaurant, ask that your food be prepared with less salt or none, if possible. READING FOOD LABELS FOR SODIUM INFORMATION The nutrition facts label is a good place to find how much sodium is in foods. Look for products with no more than 500 to 600 mg of sodium per meal and no more than 150 mg per serving. Remember that 2 g = 2000 mg. The food label may also list foods as:  Sodium-free: Less than 5 mg in a serving.  Very low sodium: 35 mg or less in a serving.  Low-sodium: 140 mg or less in a serving.  Light in sodium: 50% less sodium in a serving. For example, if a food that usually has 300 mg of sodium is changed to become light in sodium, it will have 150 mg of sodium.  Reduced sodium: 25% less sodium in a serving. For example, if a food that usually has 400 mg of sodium is changed to reduced sodium, it will have 300 mg of sodium. CHOOSING FOODS Grains  Avoid: Salted crackers and snack items. Some cereals, including instant hot cereals. Bread stuffing and biscuit mixes. Seasoned rice or pasta mixes.  Choose: Unsalted snack items. Low-sodium cereals, oats,  puffed wheat and rice, shredded wheat. English muffins and bread. Pasta. Meats  Avoid: Salted, canned, smoked, spiced, pickled meats, including fish and poultry. Bacon, ham, sausage, cold cuts, hot dogs, anchovies.  Choose: Low-sodium canned tuna and salmon. Fresh or frozen meat, poultry, and fish. Dairy  Avoid: Processed cheese and spreads. Cottage cheese. Buttermilk and condensed milk. Regular cheese.  Choose: Milk. Low-sodium cottage cheese. Yogurt. Sour cream. Low-sodium cheese. Fruits and Vegetables  Avoid: Regular canned vegetables. Regular canned tomato sauce and paste. Frozen vegetables in sauces. Olives. Pickles. Relishes. Sauerkraut.  Choose: Low-sodium canned vegetables. Low-sodium tomato sauce and paste. Frozen or fresh vegetables. Fresh and frozen fruit. Condiments  Avoid: Canned and packaged gravies. Worcestershire sauce. Tartar sauce. Barbecue sauce. Soy sauce. Steak sauce. Ketchup. Onion, garlic, and table salt. Meat flavorings and tenderizers.  Choose: Fresh and dried herbs and spices. Low-sodium varieties of mustard and ketchup. Lemon juice. Tabasco sauce. Horseradish. SAMPLE 2 GRAM SODIUM MEAL PLAN Breakfast / Sodium (mg)  1 cup low-fat milk / 143 mg  2 slices whole-wheat toast / 270 mg  1 tbs heart-healthy margarine / 153 mg  1 hard-boiled egg / 139 mg  1 small orange / 0 mg Lunch / Sodium (mg)  1 cup raw carrots / 76 mg   cup hummus / 298 mg  1 cup low-fat milk /   143 mg   cup red grapes / 2 mg  1 whole-wheat pita bread / 356 mg Dinner / Sodium (mg)  1 cup whole-wheat pasta / 2 mg  1 cup low-sodium tomato sauce / 73 mg  3 oz lean ground beef / 57 mg  1 small side salad (1 cup raw spinach leaves,  cup cucumber,  cup yellow bell pepper) with 1 tsp olive oil and 1 tsp red wine vinegar / 25 mg Snack / Sodium (mg)  1 container low-fat vanilla yogurt / 107 mg  3 graham cracker squares / 127 mg Nutrient Analysis  Calories: 2033  Protein:  77 g  Carbohydrate: 282 g  Fat: 72 g  Sodium: 1971 mg Document Released: 04/25/2005 Document Revised: 07/18/2011 Document Reviewed: 07/27/2009 [employer] Patient Information 2014 [employer].  

## 2012-11-15 NOTE — Patient Instructions (Addendum)
[facility] Discharge Instructions for Patients Receiving Chemotherapy  Today you received the following chemotherapy agents Herceptin  To help prevent nausea and vomiting after your treatment, we encourage you to take your nausea medication     If you develop nausea and vomiting that is not controlled by your nausea medication, call the clinic.   BELOW ARE SYMPTOMS THAT SHOULD BE REPORTED IMMEDIATELY:  *FEVER GREATER THAN 100.5 F  *CHILLS WITH OR WITHOUT FEVER  NAUSEA AND VOMITING THAT IS NOT CONTROLLED WITH YOUR NAUSEA MEDICATION  *UNUSUAL SHORTNESS OF BREATH  *UNUSUAL BRUISING OR BLEEDING  TENDERNESS IN MOUTH AND THROAT WITH OR WITHOUT PRESENCE OF ULCERS  *URINARY PROBLEMS  *BOWEL PROBLEMS  UNUSUAL RASH Items with * indicate a potential emergency and should be followed up as soon as possible.  Feel free to call the clinic you have any questions or concerns. The clinic phone number is [phone].    

## 2012-11-15 NOTE — Progress Notes (Signed)
Per pt request, pt only wants to take 25mg  benadryl PO.  SLJ

## 2012-11-15 NOTE — Progress Notes (Signed)
OFFICE PROGRESS NOTE   Heather Bradford, MD [address] [place] [address] Dr. Chevis Pretty Dr. Chipper Herb  DIAGNOSIS: 67 year old female with invasive lobular breast cancer, stage IIB, receiving adjuvant chemotherapy  PRIOR THERAPY:  #1 patient was originally seen in the multidisciplinary breast clinic on 03/14/2012 for new diagnosis of invasive ductal carcinoma that was HER-2 positive node-positive, ER positive PR negative with Ki-67 of 44%  #2 on MRI patient also had an anterior breast mass that was to be biopsied to decide whether or not she should receive neoadjuvant chemotherapy or adjuvant chemotherapy. Patient'[name] mass was biopsied and it was positive for a malignancy that was HER-2/neu negative.  #3 patient is now status post right mastectomy with axillary lymph node dissection with the final pathology revealing the largest tumor measuring 5 cm, 1.7 cm and 1.6 cm. Tumor was ER positive PR negative HER-2/neu positive with a Ki-67 of 44% 3 of 7 lymph nodes were positive for metastatic disease.  #4 patient will proceed with adjuvant chemotherapy and Herceptin. Chemotherapy will consist of Taxotere carboplatinum given every 21 days with weekly Herceptin. Total of 6 cycles of Taxotere carboplatin will be administered starting on 06/14/2012. She began adjuvant every 3 week herceptin on 5/29.  Because patient'[name] tumor is ER positive she will also be given antiestrogen therapy adjuvantly.  #5 Radiation therapy beginning 10/25/12  CURRENT THERAPY: Every 3 week Herceptin and radiation therapy  INTERVAL HISTORY: Heather [name] is here for f/u prior to her adjuvant treatment of her right breast cancer.  She is feeling moderately well today.  Her main complaint today is swelling.  She feels more swollen in her arms and legs recently.  Her weight is stable.  She notices a pattern of when she eats high sodium foods, such as soup or bacon.  She denies orthopnea, PND, DOE, chest pain, palpitations  or any other concerns.  Her numbness is stable.  Otherwise, a 10 point ROS is negative.    MEDICAL HISTORY: Past Medical History  Diagnosis Date  . Chronic kidney disease   . Hx: UTI (urinary tract infection)   . Right shoulder pain   . Right knee pain   . Hypertension     recently started Aldactone   . Hyperlipidemia     but not on meds;diet and exercise controlled  . Dizziness     has been going on for 53months;medical MD aware  . H/O hiatal hernia   . GERD (gastroesophageal reflux disease)     Tums prn  . Hemorrhoids   . Urinary frequency     d/t taking Aldactone  . History of UTI   . Anemia     hx of  . Cataract     immature-not sure of which eye  . Anxiety     r/t updated surgery  . Insomnia     takes Melatoniin daily  . Complication of anesthesia     pt states she is sensitive to meds  . Breast cancer     right  . Skin cancer     ALLERGIES:  is allergic to codeine; keflex; naprosyn; and tussin.  MEDICATIONS:  Current Outpatient Prescriptions  Medication Sig Dispense Refill  . B Complex-C (SUPER B COMPLEX PO) Take 1 each by mouth daily.      . calcium carbonate (TUMS - DOSED IN MG ELEMENTAL CALCIUM) 500 MG chewable tablet Chew 1 tablet by mouth daily.      . Cholecalciferol (VITAMIN D-3 PO) Take 4,000 Units  by mouth daily.      . clindamycin (CLINDAGEL) 1 % gel Apply topically 2 (two) times daily.  30 g  0  . dexamethasone (DECADRON) 4 MG tablet Take 2 tablets (8 mg total) by mouth 2 (two) times daily with a meal. Take two times a day the day before Taxotere. Then take two times a day starting the day after chemo for 3 days.  36 tablet  5  . doxycycline (VIBRA-TABS) 100 MG tablet Take 1 tablet (100 mg total) by mouth 2 (two) times daily.  20 tablet  0  . gabapentin (NEURONTIN) 100 MG capsule Take 1 capsule (100 mg total) by mouth 3 (three) times daily.  90 capsule  2  . lidocaine-prilocaine (EMLA) cream Apply topically as needed.  30 g  7  . Loratadine (CLARITIN  PO) Take 1 each by mouth daily.      . magnesium gluconate (MAGONATE) 500 MG tablet Take 500 mg by mouth 2 (two) times daily.      . Olopatadine HCl 0.2 % SOLN Apply 1 drop to eye 2 (two) times daily.  2.5 mL  0  . omeprazole (PRILOSEC) 40 MG capsule Take 1 capsule (40 mg total) by mouth daily.  30 capsule  6  . PREVIDENT 5000 BOOSTER PLUS 1.1 % PSTE Take 1 application by mouth Twice daily.      . Probiotic Product (PROBIOTIC DAILY PO) Take 1 each by mouth.      . spironolactone (ALDACTONE) 25 MG tablet Take 0.5 tablets (12.5 mg total) by mouth daily.  30 tablet  3   No current facility-administered medications for this visit.    SURGICAL HISTORY:  Past Surgical History  Procedure Laterality Date  . Cyst removed from left breast  1970  . Left wrist surgery   2004    with plate  . Colonoscopy    . Esophagogastroduodenoscopy    . Mastectomy, radical    . Mastectomy modified radical  05/10/2012    Procedure: MASTECTOMY MODIFIED RADICAL;  Surgeon: Robyne Askew, MD;  Location: [facility] OR;  Service: General;  Laterality: Right;  RIGHT MODIFIED RADICAL MASTECTOMY  . Portacath placement  05/10/2012    Procedure: INSERTION PORT-A-CATH;  Surgeon: Robyne Askew, MD;  Location: [facility] OR;  Service: General;  Laterality: Left;    REVIEW OF SYSTEMS:  General: fatigue (+), night sweats (-), fever (-), pain (-) Lymph: palpable nodes (-) HEENT: vision changes (-), mucositis (-), gum bleeding (-), epistaxis (-) Cardiovascular: chest pain (-), palpitations (-) Pulmonary: shortness of breath (-), dyspnea on exertion (-), cough (-), hemoptysis (-) GI:  Early satiety (-), melena (-), dysphagia (-), nausea/vomiting (-), diarrhea (-) GU: dysuria (-), hematuria (-), incontinence (-) Musculoskeletal: joint swelling (-), joint pain (-), back pain (-) Neuro: weakness (-), numbness (+), headache (-), confusion (-) Skin: Rash (-), lesions (-), dryness (-) Psych: depression (-), suicidal/homicidal ideation (-), feeling  of hopelessness (-)  PHYSICAL EXAMINATION: Blood pressure 137/74, pulse 72, temperature 98.1 F (36.7 C), temperature source Oral, resp. rate 20, height 5\' 4"  (1.626 m), weight 142 lb 14.4 oz (64.819 kg). Body mass index is 24.52 kg/(m^2). General: Patient is a well appearing female in no acute distress HEENT: PERRLA, sclerae anicteric no conjunctival pallor, MMM Neck: supple, no palpable adenopathy Lungs: clear to auscultation bilaterally, no wheezes, rhonchi, or rales Cardiovascular: regular rate rhythm, S1, S2, no murmurs, rubs or gallops Abdomen: Soft, non-tender, non-distended, normoactive bowel sounds, no HSM Extremities: warm and  well perfused, no clubbing, cyanosis, or edema Skin: No rashes or lesions, pustular rash on scalp Neuro: Non-focal Breasts: The right breast is status post mastectomy. There is no evidence of local recurrence. The right axilla is benign.  Erythema to right chest wall. The left breast is unremarkable. ECOG PERFORMANCE STATUS: 1   LABORATORY DATA: Lab Results  Component Value Date   WBC 3.7* 11/15/2012   HGB 12.1 11/15/2012   HCT 37.0 11/15/2012   MCV 95.9 11/15/2012   PLT 117* 11/15/2012      Chemistry      Component Value Date/Time   NA 135* 10/25/2012 0946   NA 138 05/11/2012 0640   K 3.9 10/25/2012 0946   K 4.7 05/11/2012 0640   CL 101 10/25/2012 0946   CL 99 05/11/2012 0640   CO2 24 10/25/2012 0946   CO2 20 05/11/2012 0640   BUN 12.3 10/25/2012 0946   BUN 12 05/11/2012 0640   CREATININE 0.9 10/25/2012 0946   CREATININE 0.88 05/11/2012 0640      Component Value Date/Time   CALCIUM 9.6 10/25/2012 0946   CALCIUM 9.7 05/11/2012 0640   ALKPHOS 78 10/25/2012 0946   AST 27 10/25/2012 0946   ALT 18 10/25/2012 0946   BILITOT 0.40 10/25/2012 0946     ADDITIONAL INFORMATION: CHROMOGENIC IN-SITU HYBRIDIZATION (1C - TUMOR WITH CLIP) Interpretation HER-2/NEU BY CISH - NO AMPLIFICATION OF HER-2 DETECTED. THE RATIO OF HER-2: CEP 17 SIGNALS WAS 1.25. Reference  range: Ratio: HER2:CEP17 < 1.8 - gene amplification not observed Ratio: HER2:CEP 17 1.8-2.2 - equivocal result Ratio: HER2:CEP17 > 2.2 - gene amplification observed CHROMOGENIC IN-SITU HYBRIDIZATION (1E - TUMOR WITHOUT CLIP) Interpretation: HER2/NEU BY CISH - SHOWS AMPLIFICATION BY CISH ANALYSIS. THE RATIO OF HER2: CEP 17 SIGNALS WAS 2.83. Reference range: Ratio: HER2:CEP17 < 1.8 gene amplification not observed Ratio: HER2:CEP 17 1.8-2.2 - equivocal result Ratio: HER2:CEP17 > 2.2 - gene amplification observed Pecola Leisure MD Pathologist, Electronic Signature ( Signed 05/15/2012) FINAL DIAGNOSIS Diagnosis Breast, modified radical mastectomy , right, with axillary contents 1 of 4 FINAL for Heather, NOYES [name] ([id number]) Diagnosis(continued) TUMOR #1 (WITH CLIP): - INVASIVE LOBULAR CARCINOMA, GRADE II, SEE COMMENT. - LYMPHOVASCULAR INVASION IDENTIFIED. - PREVIOUS BIOPSY SITE IDENTIFIED. TUMOR #2 (NO CLIP): - INVASIVE LOBULAR CARCINOMA, GRADE II, SEE COMMENT. - LOBULAR CARCINOMA IN SITU. - LYMPHOVASCULAR INVASION IDENTIFIED. - THREE LYMPH NODES, POSITIVE FOR METASTATIC MAMMARY CARCINOMA (3/7). - TUMOR DEPOSITS ARE 1.7, 1.6, AND 5 CM. - EXTRACAPSULAR TUMOR EXTENSION IDENTIFIED. - TWO LYMPH NODES, POSITIVE FOR ISOLATED TUMOR CELLS. Microscopic Comment BREAST, INVASIVE TUMOR, WITH LYMPH NODE SAMPLING Specimen, including laterality: Right breast Procedure: Modified radical mastectomy Grade: Tumor 1 and tumor 2: Grade II of III Tubule formation: 3 Nuclear pleomorphism: 2 Mitotic: 1 Tumor size (gross measurement: Tumor #1- 2.3 cm; tumor #2 - 2.4 cm Margins: Invasive, distance to closest margin: Tumor #1 2.7 cm - deep margin; tumor #2 - 1.6 cm - deep margin Lymphovascular invasion: Present Ductal carcinoma in situ: Absent Grade: N/A Extensive intraductal component: N/A Lobular neoplasia: Both atypical hyperplasia and in situ carcinoma Extent of tumor: Skin: Grossly negative Nipple:  Grossly negative Skeletal muscle: Microscopically negative Lymph nodes: # examined: 7 Lymph nodes with metastasis: 3 Isolated tumor cells (< 0.2 mm): two lymph nodes (Slide 1P and 1O) Micrometastasis: (> 0.2 mm and < 2.0 mm): none Macrometastasis: (> 2.0 mm): 1.7 cm , 1.6 cm and 5 cm Extracapsular extension: Present Breast prognostic profile: Tumor #1: Estrogen receptor: Not repeated, previous  study demonstrated 98% positivity ([id number]) Progesterone receptor: Not repeated, previous study demonstrated 0% positivity ([id number]) Her 2 neu: Repeated, previous study demonstrated no amplification (1.38) ([id number]) Ki-67: Not repeated, previous study demonstrated 5% proliferation rate ([id number]) Tumor #2: Estrogen receptor: Not repeated, previous study demonstrated 97% positivity ([id number]) Progesterone receptor: Not repeated, previous study demonstrated 0% positivity ([id number]) Her 2 neu: Repeated, previous study demonstrated amplification (3.65) ([id number]) Ki-67: Not repeated, previous study demonstrated 44% proliferation rate ([id number]) 2 of 4 FINAL for Heather [name], Heather [name] ([id number]) Microscopic Comment(continued) Non-neoplastic breast: Benign fibrocystic change with usual ductal hyperplasia, sclerosing adenosis, benign radial sclerosing lesion, and microcalcifications in benign ducts and lobules. TNM: mpT2, pN1a, pMX. Comments: none  RADIOGRAPHIC STUDIES:  Mr Lodema Pilot Contrast  03/23/2012  *RADIOLOGY REPORT*  Clinical Data: New diagnosis breast cancer.  Rule out metastatic disease.  MRI HEAD WITHOUT AND WITH CONTRAST  Technique:  Multiplanar, multiecho pulse sequences of the brain and surrounding structures were obtained according to standard protocol without and with intravenous contrast  Contrast: 12mL MULTIHANCE GADOBENATE DIMEGLUMINE 529 MG/ML IV SOLN  Comparison: None.  Findings: Ventricle size is normal.  Small 4 mm hyperintensities in the cerebral white matter  bilaterally do not enhance and are most likely due to chronic microvascular ischemia.  Negative for acute infarct.  Diffusion weighted imaging is normal.  Pituitary is normal in size.  Negative for intracranial hemorrhage.  Following contrast infusion, no enhancing lesions are identified.  Paranasal sinuses are clear.  Vessels at the base of the brain are patent.  IMPRESSION: Negative for metastatic disease.  No acute abnormality.   Original Report Authenticated By: Janeece Riggers, M.D.    Mr Breast Bilateral W Wo Contrast  03/12/2012  *RADIOLOGY REPORT*  Clinical Data: Recently diagnosed invasive mammary carcinoma in the 10 o'clock region of the right breast with a metastatic right axillary lymph node.  BILATERAL BREAST MRI WITH AND WITHOUT CONTRAST  Technique: Multiplanar, multisequence MR images of both breasts were obtained prior to and following the intravenous administration of 12ml of multihance.  Three dimensional images were evaluated at the independent DynaCad workstation.  Comparison:  Mammograms dated 03/05/2012, 03/01/2012, 03/01/2011 and 02/26/2010 from [facility]  Findings: There is a moderate background parenchymal enhancement pattern.  Right breast: 1.  There is a lobulated 2.2 x 1.7 x 1.8 cm mass in the posterior third of the upper outer quadrant of the right breast.  There is a post biopsy clip artifact 1 cm lateral to the mass. 2.  In the anterior third upper outer quadrant of the right breast there is patchy non mass like enhancement measuring 0.8 x 0.3 x 0.3 cm.  Left breast:  There is no abnormal enhancement the left breast.  There are enlarged right axillary lymph nodes.  The largest right axillary lymph node measures 2.4 cm and is located lateral to the pectoralis minor consistent with level I adenopathy.  There is a 1.6 cm lymph node behind the pectoralis minor consistent with enlarged level II adenopathy.  IMPRESSION:  1.  2.2 cm mass in the posterior third of the upper outer  quadrant of the right breast corresponding with the known invasive mammary carcinoma. 2.  Patchy non mass-like enhancement in the anterior third of the upper outer quadrant of the right breast.  MR guided core biopsy is recommended. 3. Enlarged level I and II right axillary adenopathy.  RECOMMENDATION: MR guided core biopsy of the right breast.  THREE-DIMENSIONAL MR IMAGE RENDERING ON INDEPENDENT WORKSTATION:  Three-dimensional  MR images were rendered by post-processing of the original MR data on an independent workstation.  The three- dimensional MR images were interpreted, and findings were reported in the accompanying complete MRI report for this study.  BI-RADS CATEGORY 4:  Suspicious abnormality - biopsy should be considered.   Original Report Authenticated By: Baird Lyons, M.D.    Mr Biopsy/wire Localization  03/20/2012  *RADIOLOGY REPORT*  Clinical Data:  Known right breast carcinoma.  Additional area of worrisome enhancement located within the anterior upper outer quadrant of the right breast.  MRI GUIDED VACUUM ASSISTED BIOPSY OF THE RIGHT BREAST WITHOUT AND WITH CONTRAST  Comparison: Previous exams.  Technique: Multiplanar, multisequence MR images of the right breast were obtained prior to and following the intravenous administration of 12 ml of Mulithance.  I met with the patient, and we discussed the procedure of MRI guided biopsy, including risks, benefits, and alternatives. Specifically, we discussed the risks of infection, bleeding, tissue injury, clip migration, and inadequate sampling.  Informed, written consent was given.  Using sterile technique, 2% Lidocaine, MRI guidance, and a 9 gauge vacuum assisted device, biopsy was performed of the area of enhancement located within the anterior portion of the upper-outer quadrant - right breast using a lateral approach.  At the conclusion of the procedure, a  tissue marker clip was deployed into the biopsy cavity.  IMPRESSION: MRI guided biopsy of the  area of enhancement located within the anterior portion of the upper-outer quadrant of the right breast as discussed above. No apparent complications.  BI-RADS CATEGORY 6:  Known biopsy-proven malignancy - appropriate action should be taken.  THREE-DIMENSIONAL MR IMAGE RENDERING ON INDEPENDENT WORKSTATION:  Three-dimensional MR images were rendered by post-processing of the original MR data on an independent workstation.  The three- dimensional MR images were interpreted, and findings were reported in the accompanying complete MRI report for this study.  The pathology associated with the right breast MR guided core biopsy demonstrated invasive mammary carcinoma and in situ mammary carcinoma.  This is concordant with imaging findings.  I have discussed findings with the patient by telephone and answered her questions.  The patient states that biopsy site is clean and dry without hematoma formation or signs of infection.  Post biopsy wound care instructions were reviewed with the patient.  Follow-up will be with Dr. Carolynne Edouard and Dr. Welton Flakes regards her treatment plan.  The patient was encouraged to call the Breast Center for additional questions or concerns   Original Report Authenticated By: Rolla Plate, M.D.    Nm Pet Image Initial (pi) Skull Base To Thigh  03/22/2012  *RADIOLOGY REPORT*  Clinical Data: Initial treatment strategy for high risk right breast cancer.  NUCLEAR MEDICINE PET SKULL BASE TO THIGH  Fasting Blood Glucose:  100  Technique:  18.6 mCi F-18 FDG was injected intravenously. CT data was obtained and used for attenuation correction and anatomic localization only.  (This was not acquired as a diagnostic CT examination.) Additional exam technical data entered on technologist worksheet.  Comparison:  MRI breast dated 03/12/2012  Findings:  Neck: No hypermetabolic lymph nodes in the neck.  Chest:  Vague hypermetabolism in the upper/central right breast, max SUV 2.7.  Additional focal hypermetabolism in  the upper/outer right breast, max SUV 2.3 (PET image 105).  These findings correspond to biopsy-proven primary breast carcinoma.  1.3 cm short axis right subpectoral lymph node (series 2/image 66), max SUV 3.1.  Right axillary lymphadenopathy measuring up to 1.6 cm short axis (series 2/image  88), max SUV 4.8).  No hypermetabolic mediastinal or hilar nodes.  No suspicious pulmonary nodules on the CT scan.  Abdomen/Pelvis:  No abnormal hypermetabolic activity within the liver, pancreas, adrenal glands, or spleen.  No hypermetabolic lymph nodes in the abdomen or pelvis.  Skeleton:  No focal hypermetabolic activity to suggest skeletal metastasis.  IMPRESSION: Two foci of hypermetabolism in the upper right breast, max SUV 2.7, corresponding to biopsy-proven primary breast carcinoma.  Associated right subpectoral and axillary nodal metastases, max SUV 4.8.  No evidence of distant metastases.   Original Report Authenticated By: Charline Bills, M.D.    Mm Digital Diagnostic Unilat R  03/20/2012  *RADIOLOGY REPORT*  Clinical Data:  Post right breast MR guided core biopsy.  DIGITAL DIAGNOSTIC RIGHT BREAST MAMMOGRAM  Comparison:  03/05/2012, 03/01/2012, 03/01/2011, 02/26/2010 mammograms from [facility].  Findings:  Films are performed following MR guided biopsy of the anterior portion of the upper-outer quadrant of the right breast. The hourglass shaped clip placed during the MR guided core biopsy appears in appropriate position.  IMPRESSION: Appropriate positioning of clip following right breast MR guided core biopsy.   Original Report Authenticated By: Rolla Plate, M.D.     ASSESSMENT: Heather [name], Heather [name]  #1  status post mastectomy with right axillary lymph node dissection 05/10/2012 for an mpT2 N1, stage stage IIB invasive lobular breast cancer, stage IIB, ER positive, HER-2/neu positive, with an Mib-1 between 5% and 44%  #2 receiving adjuvant chemotherapy consisting of Taxotere,  carboplatinum and Herceptin given every 21 days for total of 6 cycles of Taxotere carboplatinum and weekly Herceptin beginning on 06/14/12.  Adjuvant every three week herceptin began on 10/04/12, and radiation therapy began on 10/25/12.    #3 Herceptin to be continued to total one year; most recent echo 09/17/12.  PLAN:   1. Heather [name] is doing well. She will proceed with Herceptin and continue daily radiation   2. She will continue to take Super B complex daily. She declined Neurontin therapy.  3. Heather [name] will return in three weeks for her next Herceptin therapy.    4. I requested Heather [name] f/u with Dr. Gala Romney regarding the swelling.  I also gave her a handout on a low sodium diet, which foods to avoid, and which foods are good alternatives that are lower in sodium.   All questions were answered. The patient knows to call the clinic with any problems, questions or concerns. We can certainly see the patient much sooner if necessary.   I spent 25 minutes counseling the patient face to face. The total time spent in the appointment was 30 minutes.  Cherie Ouch Lyn Hollingshead, NP Medical Oncology [facility] Phone: [phone] 11/15/2012, 9:25 AM

## 2012-11-16 ENCOUNTER — Ambulatory Visit
Admission: RE | Admit: 2012-11-16 | Discharge: 2012-11-16 | Disposition: A | Discharge status: Home / Self Care | Payer: Medicare Other | Source: Ambulatory Visit | Attending: Radiation Oncology | Admitting: Radiation Oncology

## 2012-11-16 ENCOUNTER — Telehealth (HOSPITAL_COMMUNITY): Payer: Self-pay | Admitting: *Deleted

## 2012-11-16 MED ORDER — POTASSIUM CHLORIDE CRYS ER 20 MEQ PO TBCR: 20.0000 meq | EXTENDED_RELEASE_TABLET | Freq: Every day | ORAL | Status: DC | PRN

## 2012-11-16 MED ORDER — FUROSEMIDE 20 MG PO TABS: 20.0000 mg | ORAL_TABLET | Freq: Every day | ORAL | Status: DC | PRN

## 2012-11-16 NOTE — Telephone Encounter (Signed)
Cancer center called concerned about pt having swelling in hands in feet, this has gotten a little worse over past 2 weeks, pt states she has always been more prone to swelling during hot days, she is on Spiro 12.5, no SOB.  Discussed with Dr Leory Plowman he would like pt to have echo and start Lasix 20 mg prn along with potassium 20 prn, pt aware and agreeable, also discussed low salt intake.  Rx's sent in and echo sch for 6/18

## 2012-11-19 ENCOUNTER — Encounter: Payer: Self-pay | Admitting: Radiation Oncology

## 2012-11-19 ENCOUNTER — Ambulatory Visit
Admission: RE | Admit: 2012-11-19 | Discharge: 2012-11-19 | Disposition: A | Discharge status: Home / Self Care | Payer: Medicare Other | Source: Ambulatory Visit | Attending: Radiation Oncology | Admitting: Radiation Oncology

## 2012-11-19 VITALS — BP 140/83 | HR 108 | Temp 98.2°F | Resp 20 | Wt 141.2 lb

## 2012-11-19 DIAGNOSIS — C50411 Malignant neoplasm of upper-outer quadrant of right female breast: Secondary | ICD-10-CM

## 2012-11-19 MED ORDER — RADIAPLEXRX EX GEL
Freq: Once | CUTANEOUS | Status: AC
  Administered 2012-11-19: 11:00:00 via TOPICAL

## 2012-11-19 NOTE — Progress Notes (Signed)
Weekly Management Note:  Site: Right chest wall/regional lymph nodes Current Dose:  3060  cGy Projected Dose: 5040  cGy followed by right chest wall boost  Narrative: The patient is seen today for routine under treatment assessment. CBCT/MVCT images/port films were reviewed. The chart was reviewed.   She still doing well and uses Radioplex gel. She has been using hydrocortisone cream and areas where she does have pruritus. She does have mild lymphedema of the right upper extremity for which he started Lasix this past weekend.  Physical Examination:  Filed Vitals:   11/19/12 1022  BP: 140/83  Pulse: 108  Temp: 98.2 F (36.8 C)  Resp: 20  .  Weight: 141 lb 3.2 oz (64.048 kg). There patchy areas of moderate erythema particularly clavicular region and upper inner right chest wall. There were no areas of desquamation. She does have trace right upper extremity lymphedema.  Impression: Tolerating radiation therapy well.  Plan: Continue radiation therapy as planned.

## 2012-11-19 NOTE — Progress Notes (Addendum)
Weekly rad tx rcw, 17/33 completed, erythema under axilla, dermatiatis on chest wall and subvclavian, using radiples gel bid, gave 2nd tube, does c/o itching,  Using cortisone cream for that, tenderness, last Herceptin last Thursday,, and takes q 3 weeks, slight fatigue,numbness right arm, has lymphedema sleeve but needs glove to wear with, started lasix this Past Saturdy and Sunday for swelling, can use as a prn  10:21 AM  10:20 AM

## 2012-11-20 ENCOUNTER — Ambulatory Visit
Admission: RE | Admit: 2012-11-20 | Discharge: 2012-11-20 | Disposition: A | Discharge status: Home / Self Care | Payer: Medicare Other | Source: Ambulatory Visit | Attending: Radiation Oncology | Admitting: Radiation Oncology

## 2012-11-21 ENCOUNTER — Ambulatory Visit
Admission: RE | Admit: 2012-11-21 | Discharge: 2012-11-21 | Disposition: A | Discharge status: Home / Self Care | Payer: Medicare Other | Source: Ambulatory Visit | Attending: Radiation Oncology | Admitting: Radiation Oncology

## 2012-11-21 ENCOUNTER — Ambulatory Visit: Payer: Medicare Other

## 2012-11-22 ENCOUNTER — Ambulatory Visit
Admission: RE | Admit: 2012-11-22 | Discharge: 2012-11-22 | Disposition: A | Discharge status: Home / Self Care | Payer: Medicare Other | Source: Ambulatory Visit | Attending: Radiation Oncology | Admitting: Radiation Oncology

## 2012-11-22 ENCOUNTER — Encounter: Payer: Self-pay | Admitting: Oncology

## 2012-11-22 NOTE — Progress Notes (Signed)
Called patient back from her message. She needs financial asst with sleeve. Based on income for her and hubby-overqualified for grant. I gave her Lauren's ph# to see if possible another foundation in community that could asst her. I will check with Misty Stanley also and see if any others.

## 2012-11-23 ENCOUNTER — Ambulatory Visit
Admission: RE | Admit: 2012-11-23 | Discharge: 2012-11-23 | Disposition: A | Discharge status: Home / Self Care | Payer: Medicare Other | Source: Ambulatory Visit | Attending: Radiation Oncology | Admitting: Radiation Oncology

## 2012-11-23 ENCOUNTER — Ambulatory Visit (HOSPITAL_COMMUNITY)
Admission: RE | Admit: 2012-11-23 | Discharge: 2012-11-23 | Disposition: A | Discharge status: Home / Self Care | Payer: Medicare Other | Source: Ambulatory Visit | Attending: Internal Medicine | Admitting: Internal Medicine

## 2012-11-23 DIAGNOSIS — Z01818 Encounter for other preprocedural examination: Secondary | ICD-10-CM | POA: Insufficient documentation

## 2012-11-23 DIAGNOSIS — Z79899 Other long term (current) drug therapy: Secondary | ICD-10-CM | POA: Insufficient documentation

## 2012-11-23 DIAGNOSIS — C50919 Malignant neoplasm of unspecified site of unspecified female breast: Secondary | ICD-10-CM | POA: Insufficient documentation

## 2012-11-23 DIAGNOSIS — C50411 Malignant neoplasm of upper-outer quadrant of right female breast: Secondary | ICD-10-CM

## 2012-11-23 DIAGNOSIS — I10 Essential (primary) hypertension: Secondary | ICD-10-CM | POA: Insufficient documentation

## 2012-11-23 DIAGNOSIS — E785 Hyperlipidemia, unspecified: Secondary | ICD-10-CM | POA: Insufficient documentation

## 2012-11-23 DIAGNOSIS — Z09 Encounter for follow-up examination after completed treatment for conditions other than malignant neoplasm: Secondary | ICD-10-CM

## 2012-11-23 NOTE — Progress Notes (Signed)
  Echocardiogram 2D Echocardiogram has been performed.  Jorje Guild 11/23/2012, 12:20 PM

## 2012-11-26 ENCOUNTER — Ambulatory Visit: Payer: Medicare Other | Admitting: Physical Therapy

## 2012-11-26 ENCOUNTER — Ambulatory Visit
Admission: RE | Admit: 2012-11-26 | Discharge: 2012-11-26 | Disposition: A | Discharge status: Home / Self Care | Payer: Medicare Other | Source: Ambulatory Visit | Attending: Radiation Oncology | Admitting: Radiation Oncology

## 2012-11-26 ENCOUNTER — Encounter: Payer: Self-pay | Admitting: Radiation Oncology

## 2012-11-26 VITALS — BP 141/84 | HR 74 | Temp 97.8°F | Resp 20 | Wt 140.3 lb

## 2012-11-26 DIAGNOSIS — C50411 Malignant neoplasm of upper-outer quadrant of right female breast: Secondary | ICD-10-CM

## 2012-11-26 NOTE — Progress Notes (Signed)
Pt fatigued, no loss of appetite. She is applying Radiaplex to right chest wall treatment area, Cortisone cream prn for itching. Area is hyperpigmented, no desquamation reported. Pt indicated bruise on her calf of left leg, states she noticed it 1 week ago. She denies pain in this area; area w/o redness or warmth. Advised she continue to monitor this area for changes in appearance such as redness. Pt had ECHO done 11/23/12 and is requesting dr give her results.

## 2012-11-26 NOTE — Progress Notes (Signed)
   Weekly Management Note:  outpatient Current Dose:  39.6 Gy  Projected Dose: 50.4 Gy + boost  Narrative:  The patient presents for routine under treatment assessment.  CBCT/MVCT images/Port film x-rays were reviewed.  The chart was checked. Skin a little more irritated. PLTs a bit low.  New bruise, right calf, no known trauma  Physical Findings:  weight is 140 lb 4.8 oz (63.64 kg). Her oral temperature is 97.8 F (36.6 C). Her blood pressure is 141/84 and her pulse is 74. Her respiration is 20.  skin over right chest wall erythematous with dryness in UIQ.  No ankle edema. No calf tenderness in right calf, but there is a bruise there with a little superficial firmness under bruise; no redness.  CBC    Component Value Date/Time   WBC 3.7* 11/15/2012 0810   WBC 7.5 05/11/2012 0853   RBC 3.86 11/15/2012 0810   RBC 4.30 05/11/2012 0853   HGB 12.1 11/15/2012 0810   HGB 12.1 05/11/2012 0853   HCT 37.0 11/15/2012 0810   HCT 37.1 05/11/2012 0853   PLT 117* 11/15/2012 0810   PLT 179 05/11/2012 0853   MCV 95.9 11/15/2012 0810   MCV 86.3 05/11/2012 0853   MCH 31.3 11/15/2012 0810   MCH 28.1 05/11/2012 0853   MCHC 32.7 11/15/2012 0810   MCHC 32.6 05/11/2012 0853   RDW 13.5 11/15/2012 0810   RDW 12.9 05/11/2012 0853   LYMPHSABS 0.8* 11/15/2012 0810   MONOABS 0.5 11/15/2012 0810   EOSABS 0.4 11/15/2012 0810   BASOSABS 0.1 11/15/2012 0810     Impression:  The patient is tolerating radiotherapy.  Plan:  Continue radiotherapy as planned. Superficial bruise. Discussed signs of DVTs - index of suspicious currently low  ________________________________   Lonie Peak, M.D.

## 2012-11-27 ENCOUNTER — Ambulatory Visit
Admission: RE | Admit: 2012-11-27 | Discharge: 2012-11-27 | Disposition: A | Discharge status: Home / Self Care | Payer: Medicare Other | Source: Ambulatory Visit | Attending: Radiation Oncology | Admitting: Radiation Oncology

## 2012-11-28 ENCOUNTER — Ambulatory Visit
Admission: RE | Admit: 2012-11-28 | Discharge: 2012-11-28 | Disposition: A | Discharge status: Home / Self Care | Payer: Medicare Other | Source: Ambulatory Visit | Attending: Radiation Oncology | Admitting: Radiation Oncology

## 2012-11-29 ENCOUNTER — Ambulatory Visit
Admission: RE | Admit: 2012-11-29 | Discharge: 2012-11-29 | Disposition: A | Discharge status: Home / Self Care | Payer: Medicare Other | Source: Ambulatory Visit | Attending: Radiation Oncology | Admitting: Radiation Oncology

## 2012-11-29 ENCOUNTER — Telehealth (HOSPITAL_COMMUNITY): Payer: Self-pay | Admitting: *Deleted

## 2012-11-29 NOTE — Telephone Encounter (Signed)
Called Heather [name] with echo results per Dr Gala Romney can f/u in 3 month with repeat echo, Heather [name] states the swelling is better, if she has problems with it again or is having to take lasix on a regular basis she will let us know

## 2012-11-30 ENCOUNTER — Ambulatory Visit
Admission: RE | Admit: 2012-11-30 | Discharge: 2012-11-30 | Disposition: A | Discharge status: Home / Self Care | Payer: Medicare Other | Source: Ambulatory Visit | Attending: Radiation Oncology | Admitting: Radiation Oncology

## 2012-12-03 ENCOUNTER — Ambulatory Visit
Admission: RE | Admit: 2012-12-03 | Discharge: 2012-12-03 | Disposition: A | Discharge status: Home / Self Care | Payer: Medicare Other | Source: Ambulatory Visit | Attending: Radiation Oncology | Admitting: Radiation Oncology

## 2012-12-03 ENCOUNTER — Ambulatory Visit: Payer: Medicare Other | Admitting: Physical Therapy

## 2012-12-03 ENCOUNTER — Encounter: Payer: Self-pay | Admitting: Radiation Oncology

## 2012-12-03 VITALS — BP 136/78 | HR 80 | Temp 97.7°F | Resp 20 | Wt 140.6 lb

## 2012-12-03 DIAGNOSIS — C50411 Malignant neoplasm of upper-outer quadrant of right female breast: Secondary | ICD-10-CM

## 2012-12-03 MED ORDER — BIAFINE EX EMUL
CUTANEOUS | Status: DC | PRN
  Administered 2012-12-03: 11:00:00 via TOPICAL

## 2012-12-03 NOTE — Progress Notes (Signed)
Electron beam simulation note: The patient was taken to the [facility]. I marked her right chest wall for her electron beam boost. One custom block is constructed to conform the field. She is set up RAO, en face. I'm prescribing 1000 cGy in 5 sessions. On the first day of her treatment she will have construction of 0.8 cm bolus to maximize the skin dose. A special port plan is requested.

## 2012-12-03 NOTE — Progress Notes (Signed)
Weekly rad txs,27/33 Right chest wall  excoriated erythema on front rcw, and subclavicular area,under axilla peeling slight, on back dermatitis, erythema, c/o burning and increased  itching, gave gel pads and biafine cream, Fatigue some

## 2012-12-03 NOTE — Progress Notes (Signed)
Weekly Management Note:  Site: Right chest wall and regional lymph nodes Current Dose:  4860  cGy Projected Dose: 5040  cGy followed by right chest wall/mastectomy scar boost  Narrative: The patient is seen today for routine under treatment assessment. CBCT/MVCT images/port films were reviewed. The chart was reviewed.   She still doing well but is having more her right chest wall and axillary discomfort. She was given Biafine hydrogel pads after having used Radioplex gel. She also reports pruritus along her chest wall.  Physical Examination:  Filed Vitals:   12/03/12 0956  BP: 136/78  Pulse: 80  Temp: 97.7 F (36.5 C)  Resp: 20  .  Weight: 140 lb 9.6 oz (63.776 kg). There is diffuse erythema along her right chest wall/axilla/posterior shoulder and clavicular region as expected. There is patchy dry desquamation but no moist desquamation.  Impression: Tolerating radiation therapy well, with radiation dermatitis as expected. I told she may take ibuprofen when necessary and use hydrocortisone cream for areas of pruritus. She may also take Benadryl at night. She is to notify me if she develops a moist desquamation. She'll finish her treatment early next week.  Plan: Continue radiation therapy as planned.

## 2012-12-04 ENCOUNTER — Ambulatory Visit
Admission: RE | Admit: 2012-12-04 | Discharge: 2012-12-04 | Disposition: A | Discharge status: Home / Self Care | Payer: Medicare Other | Source: Ambulatory Visit | Attending: Radiation Oncology | Admitting: Radiation Oncology

## 2012-12-05 ENCOUNTER — Telehealth: Payer: Self-pay | Admitting: Oncology

## 2012-12-05 ENCOUNTER — Ambulatory Visit (HOSPITAL_BASED_OUTPATIENT_CLINIC_OR_DEPARTMENT_OTHER): Payer: Medicare Other | Admitting: Adult Health

## 2012-12-05 ENCOUNTER — Telehealth: Payer: Self-pay | Admitting: *Deleted

## 2012-12-05 ENCOUNTER — Other Ambulatory Visit (HOSPITAL_BASED_OUTPATIENT_CLINIC_OR_DEPARTMENT_OTHER): Payer: Medicare Other | Admitting: Lab

## 2012-12-05 ENCOUNTER — Encounter: Payer: Self-pay | Admitting: Adult Health

## 2012-12-05 ENCOUNTER — Ambulatory Visit: Payer: Medicare Other

## 2012-12-05 ENCOUNTER — Ambulatory Visit
Admission: RE | Admit: 2012-12-05 | Discharge: 2012-12-05 | Disposition: A | Discharge status: Home / Self Care | Payer: Medicare Other | Source: Ambulatory Visit | Attending: Radiation Oncology | Admitting: Radiation Oncology

## 2012-12-05 VITALS — BP 152/80 | HR 70 | Temp 98.3°F | Resp 20 | Ht 64.0 in | Wt 140.1 lb

## 2012-12-05 DIAGNOSIS — C50419 Malignant neoplasm of upper-outer quadrant of unspecified female breast: Secondary | ICD-10-CM

## 2012-12-05 DIAGNOSIS — C50411 Malignant neoplasm of upper-outer quadrant of right female breast: Secondary | ICD-10-CM

## 2012-12-05 LAB — CBC WITH DIFFERENTIAL/PLATELET
BASO%: 0.7 % (ref 0.0–2.0)
Basophils Absolute: 0 10*3/uL (ref 0.0–0.1)
EOS%: 2.9 % (ref 0.0–7.0)
Eosinophils Absolute: 0.1 10*3/uL (ref 0.0–0.5)
HCT: 39.8 % (ref 34.8–46.6)
HGB: 13.4 g/dL (ref 11.6–15.9)
LYMPH%: 17.6 % (ref 14.0–49.7)
MCH: 30.7 pg (ref 25.1–34.0)
MCHC: 33.7 g/dL (ref 31.5–36.0)
MCV: 91.3 fL (ref 79.5–101.0)
MONO#: 0.4 10*3/uL (ref 0.1–0.9)
MONO%: 15.4 % — ABNORMAL HIGH (ref 0.0–14.0)
NEUT#: 1.7 10*3/uL (ref 1.5–6.5)
NEUT%: 63.4 % (ref 38.4–76.8)
Platelets: 132 10*3/uL — ABNORMAL LOW (ref 145–400)
RBC: 4.36 10*6/uL (ref 3.70–5.45)
RDW: 13.1 % (ref 11.2–14.5)
WBC: 2.7 10*3/uL — ABNORMAL LOW (ref 3.9–10.3)
lymph#: 0.5 10*3/uL — ABNORMAL LOW (ref 0.9–3.3)

## 2012-12-05 NOTE — Telephone Encounter (Signed)
Per staff message and POF I have scheduled appts.  JMW  

## 2012-12-05 NOTE — Telephone Encounter (Signed)
, °

## 2012-12-05 NOTE — Patient Instructions (Signed)
Doing well.  Proceed with Herceptin tomorrow.  Please call us if you have any questions or concerns.

## 2012-12-05 NOTE — Progress Notes (Signed)
OFFICE PROGRESS NOTE   [name], MD [address] [place] [address] Dr. Chevis Pretty Dr. Chipper Herb  DIAGNOSIS: 67 year old female with invasive lobular breast cancer, stage IIB, receiving adjuvant chemotherapy  PRIOR THERAPY:  #1 patient was originally seen in the multidisciplinary breast clinic on 03/14/2012 for new diagnosis of invasive ductal carcinoma that was HER-2 positive node-positive, ER positive PR negative with Ki-67 of 44%  #2 on MRI patient also had an anterior breast mass that was to be biopsied to decide whether or not she should receive neoadjuvant chemotherapy or adjuvant chemotherapy. Patient's mass was biopsied and it was positive for a malignancy that was HER-2/neu negative.  #3 patient is now status post right mastectomy with axillary lymph node dissection with the final pathology revealing the largest tumor measuring 5 cm, 1.7 cm and 1.6 cm. Tumor was ER positive PR negative HER-2/neu positive with a Ki-67 of 44% 3 of 7 lymph nodes were positive for metastatic disease.  #4 patient will proceed with adjuvant chemotherapy and Herceptin. Chemotherapy will consist of Taxotere carboplatinum given every 21 days with weekly Herceptin. Total of 6 cycles of Taxotere carboplatin will be administered starting on 06/14/2012. She began adjuvant every 3 week herceptin on 5/29.  Because patient's tumor is ER positive she will also be given antiestrogen therapy adjuvantly.  #5 Radiation therapy beginning 10/25/12  CURRENT THERAPY: Every 3 week Herceptin and radiation therapy  INTERVAL HISTORY: Heather [name] is here for f/u prior to her adjuvant treatment of her right breast cancer.  She is feeling moderately well today.  She had swelling at her last appointment.  Underwent echo on 11/23/12 that showed a LVEF of 60%.  She called Dr. Prescott Gum office and was prescribed lasix.  She took two doses and the swelling has since improved.  She continues to have numbness in her  feet and wants to know if it is permanent.  Patient is also complaining of a small nodule that appeared in the radiation field a couple of days ago that is itching and mildly tender.  Otherwise, a 10 point ROS is neg.   MEDICAL HISTORY: Past Medical History  Diagnosis Date  . Chronic kidney disease   . Hx: UTI (urinary tract infection)   . Right shoulder pain   . Right knee pain   . Hypertension     recently started Aldactone   . Hyperlipidemia     but not on meds;diet and exercise controlled  . Dizziness     has been going on for 34months;medical MD aware  . H/O hiatal hernia   . GERD (gastroesophageal reflux disease)     Tums prn  . Hemorrhoids   . Urinary frequency     d/t taking Aldactone  . History of UTI   . Anemia     hx of  . Cataract     immature-not sure of which eye  . Anxiety     r/t updated surgery  . Insomnia     takes Melatoniin daily  . Complication of anesthesia     pt states she is sensitive to meds  . Breast cancer     right  . Skin cancer     ALLERGIES:  is allergic to codeine; keflex; naprosyn; and tussin.  MEDICATIONS:  Current Outpatient Prescriptions  Medication Sig Dispense Refill  . B Complex-C (SUPER B COMPLEX PO) Take 1 each by mouth daily.      . calcium carbonate (TUMS - DOSED IN MG ELEMENTAL  CALCIUM) 500 MG chewable tablet Chew 1 tablet by mouth daily.      . Cholecalciferol (VITAMIN D-3 PO) Take 4,000 Units by mouth daily.      . clindamycin (CLINDAGEL) 1 % gel Apply topically 2 (two) times daily.  30 g  0  . doxycycline (VIBRA-TABS) 100 MG tablet Take 1 tablet (100 mg total) by mouth 2 (two) times daily.  20 tablet  0  . emollient (BIAFINE) cream Apply 1 application topically as needed.      . furosemide (LASIX) 20 MG tablet Take 1 tablet (20 mg total) by mouth daily as needed.  30 tablet  3  . gabapentin (NEURONTIN) 100 MG capsule Take 1 capsule (100 mg total) by mouth 3 (three) times daily.  90 capsule  2  . hyaluronate sodium  (RADIAPLEXRX) GEL Apply 1 application topically 2 (two) times daily. 2nd tube 11/19/12      . lidocaine-prilocaine (EMLA) cream Apply topically as needed.  30 g  7  . magnesium gluconate (MAGONATE) 500 MG tablet Take 500 mg by mouth 2 (two) times daily.      . Olopatadine HCl 0.2 % SOLN Apply 1 drop to eye 2 (two) times daily.  2.5 mL  0  . omeprazole (PRILOSEC) 40 MG capsule Take 1 capsule (40 mg total) by mouth daily.  30 capsule  6  . potassium chloride SA (K-DUR,KLOR-CON) 20 MEQ tablet Take 1 tablet (20 mEq total) by mouth daily as needed. When you take Furosemide  90 tablet  3  . PREVIDENT 5000 BOOSTER PLUS 1.1 % PSTE Take 1 application by mouth Twice daily.      . Probiotic Product (PROBIOTIC DAILY PO) Take 1 each by mouth.      . spironolactone (ALDACTONE) 25 MG tablet Take 0.5 tablets (12.5 mg total) by mouth daily.  30 tablet  3   No current facility-administered medications for this visit.    SURGICAL HISTORY:  Past Surgical History  Procedure Laterality Date  . Cyst removed from left breast  1970  . Left wrist surgery   2004    with plate  . Colonoscopy    . Esophagogastroduodenoscopy    . Mastectomy, radical    . Mastectomy modified radical  05/10/2012    Procedure: MASTECTOMY MODIFIED RADICAL;  Surgeon: Robyne Askew, MD;  Location: [facility] OR;  Service: General;  Laterality: Right;  RIGHT MODIFIED RADICAL MASTECTOMY  . Portacath placement  05/10/2012    Procedure: INSERTION PORT-A-CATH;  Surgeon: Robyne Askew, MD;  Location: [facility] OR;  Service: General;  Laterality: Left;    REVIEW OF SYSTEMS:  General: fatigue (+), night sweats (-), fever (-), pain (-) Lymph: palpable nodes (-) HEENT: vision changes (-), mucositis (-), gum bleeding (-), epistaxis (-) Cardiovascular: chest pain (-), palpitations (-) Pulmonary: shortness of breath (-), dyspnea on exertion (-), cough (-), hemoptysis (-) GI:  Early satiety (-), melena (-), dysphagia (-), nausea/vomiting (-), diarrhea (-) GU:  dysuria (-), hematuria (-), incontinence (-) Musculoskeletal: joint swelling (-), joint pain (-), back pain (-) Neuro: weakness (-), numbness (+), headache (-), confusion (-) Skin: Rash (-), lesions (-), dryness (-) Psych: depression (-), suicidal/homicidal ideation (-), feeling of hopelessness (-)  PHYSICAL EXAMINATION: Blood pressure 152/80, pulse 70, temperature 98.3 F (36.8 C), temperature source Oral, resp. rate 20, height 5\' 4"  (1.626 m), weight 140 lb 1.6 oz (63.549 kg). Body mass index is 24.04 kg/(m^2). General: Patient is a well appearing female in no acute  distress HEENT: PERRLA, sclerae anicteric no conjunctival pallor, MMM Neck: supple, no palpable adenopathy Lungs: clear to auscultation bilaterally, no wheezes, rhonchi, or rales Cardiovascular: regular rate rhythm, S1, S2, no murmurs, rubs or gallops Abdomen: Soft, non-tender, non-distended, normoactive bowel sounds, no HSM Extremities: warm and well perfused, no clubbing, cyanosis, or edema Skin: No rashes or lesions, pustular rash on scalp Neuro: Non-focal Breasts: The right breast is status post mastectomy. There is no evidence of local recurrence. The right axilla is benign.  Erythema to right chest wall., small nodule in middle chest wall towards right mastectomy site, feels slightly fluctuant and tender. The left breast is unremarkable. ECOG PERFORMANCE STATUS: 1   LABORATORY DATA: Lab Results  Component Value Date   WBC 2.7* 12/05/2012   HGB 13.4 12/05/2012   HCT 39.8 12/05/2012   MCV 91.3 12/05/2012   PLT 132* 12/05/2012      Chemistry      Component Value Date/Time   NA 138 11/15/2012 0812   NA 138 05/11/2012 0640   K 4.0 11/15/2012 0812   K 4.7 05/11/2012 0640   CL 101 10/25/2012 0946   CL 99 05/11/2012 0640   CO2 25 11/15/2012 0812   CO2 20 05/11/2012 0640   BUN 13.7 11/15/2012 0812   BUN 12 05/11/2012 0640   CREATININE 1.0 11/15/2012 0812   CREATININE 0.88 05/11/2012 0640      Component Value Date/Time   CALCIUM  9.9 11/15/2012 0812   CALCIUM 9.7 05/11/2012 0640   ALKPHOS 83 11/15/2012 0812   AST 30 11/15/2012 0812   ALT 21 11/15/2012 0812   BILITOT 0.40 11/15/2012 0812     ADDITIONAL INFORMATION: CHROMOGENIC IN-SITU HYBRIDIZATION (1C - TUMOR WITH CLIP) Interpretation HER-2/NEU BY CISH - NO AMPLIFICATION OF HER-2 DETECTED. THE RATIO OF HER-2: CEP 17 SIGNALS WAS 1.25. Reference range: Ratio: HER2:CEP17 < 1.8 - gene amplification not observed Ratio: HER2:CEP 17 1.8-2.2 - equivocal result Ratio: HER2:CEP17 > 2.2 - gene amplification observed CHROMOGENIC IN-SITU HYBRIDIZATION (1E - TUMOR WITHOUT CLIP) Interpretation: HER2/NEU BY CISH - SHOWS AMPLIFICATION BY CISH ANALYSIS. THE RATIO OF HER2: CEP 17 SIGNALS WAS 2.83. Reference range: Ratio: HER2:CEP17 < 1.8 gene amplification not observed Ratio: HER2:CEP 17 1.8-2.2 - equivocal result Ratio: HER2:CEP17 > 2.2 - gene amplification observed Pecola Leisure MD Pathologist, Electronic Signature ( Signed 05/15/2012) FINAL DIAGNOSIS Diagnosis Breast, modified radical mastectomy , right, with axillary contents 1 of 4 FINAL for ANUREET, BRUINGTON S ([id number]) Diagnosis(continued) TUMOR #1 (WITH CLIP): - INVASIVE LOBULAR CARCINOMA, GRADE II, SEE COMMENT. - LYMPHOVASCULAR INVASION IDENTIFIED. - PREVIOUS BIOPSY SITE IDENTIFIED. TUMOR #2 (NO CLIP): - INVASIVE LOBULAR CARCINOMA, GRADE II, SEE COMMENT. - LOBULAR CARCINOMA IN SITU. - LYMPHOVASCULAR INVASION IDENTIFIED. - THREE LYMPH NODES, POSITIVE FOR METASTATIC MAMMARY CARCINOMA (3/7). - TUMOR DEPOSITS ARE 1.7, 1.6, AND 5 CM. - EXTRACAPSULAR TUMOR EXTENSION IDENTIFIED. - TWO LYMPH NODES, POSITIVE FOR ISOLATED TUMOR CELLS. Microscopic Comment BREAST, INVASIVE TUMOR, WITH LYMPH NODE SAMPLING Specimen, including laterality: Right breast Procedure: Modified radical mastectomy Grade: Tumor 1 and tumor 2: Grade II of III Tubule formation: 3 Nuclear pleomorphism: 2 Mitotic: 1 Tumor size (gross measurement: Tumor #1-  2.3 cm; tumor #2 - 2.4 cm Margins: Invasive, distance to closest margin: Tumor #1 2.7 cm - deep margin; tumor #2 - 1.6 cm - deep margin Lymphovascular invasion: Present Ductal carcinoma in situ: Absent Grade: N/A Extensive intraductal component: N/A Lobular neoplasia: Both atypical hyperplasia and in situ carcinoma Extent of tumor: Skin: Grossly negative Nipple: Grossly  negative Skeletal muscle: Microscopically negative Lymph nodes: # examined: 7 Lymph nodes with metastasis: 3 Isolated tumor cells (< 0.2 mm): two lymph nodes (Slide 1P and 1O) Micrometastasis: (> 0.2 mm and < 2.0 mm): none Macrometastasis: (> 2.0 mm): 1.7 cm , 1.6 cm and 5 cm Extracapsular extension: Present Breast prognostic profile: Tumor #1: Estrogen receptor: Not repeated, previous study demonstrated 98% positivity ([id number]) Progesterone receptor: Not repeated, previous study demonstrated 0% positivity ([id number]) Her 2 neu: Repeated, previous study demonstrated no amplification (1.38) ([id number]) Ki-67: Not repeated, previous study demonstrated 5% proliferation rate ([id number]) Tumor #2: Estrogen receptor: Not repeated, previous study demonstrated 97% positivity ([id number]) Progesterone receptor: Not repeated, previous study demonstrated 0% positivity ([id number]) Her 2 neu: Repeated, previous study demonstrated amplification (3.65) ([id number]) Ki-67: Not repeated, previous study demonstrated 44% proliferation rate ([id number]) 2 of 4 FINAL for JAMILLE, FISHER ([id number]) Microscopic Comment(continued) Non-neoplastic breast: Benign fibrocystic change with usual ductal hyperplasia, sclerosing adenosis, benign radial sclerosing lesion, and microcalcifications in benign ducts and lobules. TNM: mpT2, pN1a, pMX. Comments: none  RADIOGRAPHIC STUDIES:  Mr Lodema Pilot Contrast  03/23/2012  *RADIOLOGY REPORT*  Clinical Data: New diagnosis breast cancer.  Rule out metastatic disease.  MRI HEAD WITHOUT  AND WITH CONTRAST  Technique:  Multiplanar, multiecho pulse sequences of the brain and surrounding structures were obtained according to standard protocol without and with intravenous contrast  Contrast: 12mL MULTIHANCE GADOBENATE DIMEGLUMINE 529 MG/ML IV SOLN  Comparison: None.  Findings: Ventricle size is normal.  Small 4 mm hyperintensities in the cerebral white matter bilaterally do not enhance and are most likely due to chronic microvascular ischemia.  Negative for acute infarct.  Diffusion weighted imaging is normal.  Pituitary is normal in size.  Negative for intracranial hemorrhage.  Following contrast infusion, no enhancing lesions are identified.  Paranasal sinuses are clear.  Vessels at the base of the brain are patent.  IMPRESSION: Negative for metastatic disease.  No acute abnormality.   Original Report Authenticated By: Janeece Riggers, M.D.    Mr Breast Bilateral W Wo Contrast  03/12/2012  *RADIOLOGY REPORT*  Clinical Data: Recently diagnosed invasive mammary carcinoma in the 10 o'clock region of the right breast with a metastatic right axillary lymph node.  BILATERAL BREAST MRI WITH AND WITHOUT CONTRAST  Technique: Multiplanar, multisequence MR images of both breasts were obtained prior to and following the intravenous administration of 12ml of multihance.  Three dimensional images were evaluated at the independent DynaCad workstation.  Comparison:  Mammograms dated 03/05/2012, 03/01/2012, 03/01/2011 and 02/26/2010 from [facility].  Findings: There is a moderate background parenchymal enhancement pattern.  Right breast: 1.  There is a lobulated 2.2 x 1.7 x 1.8 cm mass in the posterior third of the upper outer quadrant of the right breast.  There is a post biopsy clip artifact 1 cm lateral to the mass. 2.  In the anterior third upper outer quadrant of the right breast there is patchy non mass like enhancement measuring 0.8 x 0.3 x 0.3 cm.  Left breast:  There is no abnormal enhancement the  left breast.  There are enlarged right axillary lymph nodes.  The largest right axillary lymph node measures 2.4 cm and is located lateral to the pectoralis minor consistent with level I adenopathy.  There is a 1.6 cm lymph node behind the pectoralis minor consistent with enlarged level II adenopathy.  IMPRESSION:  1.  2.2 cm mass in the posterior third of the upper outer quadrant of the right breast  corresponding with the known invasive mammary carcinoma. 2.  Patchy non mass-like enhancement in the anterior third of the upper outer quadrant of the right breast.  MR guided core biopsy is recommended. 3. Enlarged level I and II right axillary adenopathy.  RECOMMENDATION: MR guided core biopsy of the right breast.  THREE-DIMENSIONAL MR IMAGE RENDERING ON INDEPENDENT WORKSTATION:  Three-dimensional MR images were rendered by post-processing of the original MR data on an independent workstation.  The three- dimensional MR images were interpreted, and findings were reported in the accompanying complete MRI report for this study.  BI-RADS CATEGORY 4:  Suspicious abnormality - biopsy should be considered.   Original Report Authenticated By: Baird Lyons, M.D.    Mr Biopsy/wire Localization  03/20/2012  *RADIOLOGY REPORT*  Clinical Data:  Known right breast carcinoma.  Additional area of worrisome enhancement located within the anterior upper outer quadrant of the right breast.  MRI GUIDED VACUUM ASSISTED BIOPSY OF THE RIGHT BREAST WITHOUT AND WITH CONTRAST  Comparison: Previous exams.  Technique: Multiplanar, multisequence MR images of the right breast were obtained prior to and following the intravenous administration of 12 ml of Mulithance.  I met with the patient, and we discussed the procedure of MRI guided biopsy, including risks, benefits, and alternatives. Specifically, we discussed the risks of infection, bleeding, tissue injury, clip migration, and inadequate sampling.  Informed, written consent was given.   Using sterile technique, 2% Lidocaine, MRI guidance, and a 9 gauge vacuum assisted device, biopsy was performed of the area of enhancement located within the anterior portion of the upper-outer quadrant - right breast using a lateral approach.  At the conclusion of the procedure, a  tissue marker clip was deployed into the biopsy cavity.  IMPRESSION: MRI guided biopsy of the area of enhancement located within the anterior portion of the upper-outer quadrant of the right breast as discussed above. No apparent complications.  BI-RADS CATEGORY 6:  Known biopsy-proven malignancy - appropriate action should be taken.  THREE-DIMENSIONAL MR IMAGE RENDERING ON INDEPENDENT WORKSTATION:  Three-dimensional MR images were rendered by post-processing of the original MR data on an independent workstation.  The three- dimensional MR images were interpreted, and findings were reported in the accompanying complete MRI report for this study.  The pathology associated with the right breast MR guided core biopsy demonstrated invasive mammary carcinoma and in situ mammary carcinoma.  This is concordant with imaging findings.  I have discussed findings with the patient by telephone and answered her questions.  The patient states that biopsy site is clean and dry without hematoma formation or signs of infection.  Post biopsy wound care instructions were reviewed with the patient.  Follow-up will be with Dr. Carolynne Edouard and Dr. Welton Flakes regards her treatment plan.  The patient was encouraged to call the [facility] for additional questions or concerns   Original Report Authenticated By: Rolla Plate, M.D.    Nm Pet Image Initial (pi) Skull Base To Thigh  03/22/2012  *RADIOLOGY REPORT*  Clinical Data: Initial treatment strategy for high risk right breast cancer.  NUCLEAR MEDICINE PET SKULL BASE TO THIGH  Fasting Blood Glucose:  100  Technique:  18.6 mCi F-18 FDG was injected intravenously. CT data was obtained and used for attenuation  correction and anatomic localization only.  (This was not acquired as a diagnostic CT examination.) Additional exam technical data entered on technologist worksheet.  Comparison:  MRI breast dated 03/12/2012  Findings:  Neck: No hypermetabolic lymph nodes in the neck.  Chest:  Vague hypermetabolism in the upper/central right breast, max SUV 2.7.  Additional focal hypermetabolism in the upper/outer right breast, max SUV 2.3 (PET image 105).  These findings correspond to biopsy-proven primary breast carcinoma.  1.3 cm short axis right subpectoral lymph node (series 2/image 66), max SUV 3.1.  Right axillary lymphadenopathy measuring up to 1.6 cm short axis (series 2/image 88), max SUV 4.8).  No hypermetabolic mediastinal or hilar nodes.  No suspicious pulmonary nodules on the CT scan.  Abdomen/Pelvis:  No abnormal hypermetabolic activity within the liver, pancreas, adrenal glands, or spleen.  No hypermetabolic lymph nodes in the abdomen or pelvis.  Skeleton:  No focal hypermetabolic activity to suggest skeletal metastasis.  IMPRESSION: Two foci of hypermetabolism in the upper right breast, max SUV 2.7, corresponding to biopsy-proven primary breast carcinoma.  Associated right subpectoral and axillary nodal metastases, max SUV 4.8.  No evidence of distant metastases.   Original Report Authenticated By: Charline Bills, M.D.    Mm Digital Diagnostic Unilat R  03/20/2012  *RADIOLOGY REPORT*  Clinical Data:  Post right breast MR guided core biopsy.  DIGITAL DIAGNOSTIC RIGHT BREAST MAMMOGRAM  Comparison:  03/05/2012, 03/01/2012, 03/01/2011, 02/26/2010 mammograms from [facility].  Findings:  Films are performed following MR guided biopsy of the anterior portion of the upper-outer quadrant of the right breast. The hourglass shaped clip placed during the MR guided core biopsy appears in appropriate position.  IMPRESSION: Appropriate positioning of clip following right breast MR guided core biopsy.   Original Report  Authenticated By: Rolla Plate, M.D.     ASSESSMENT: 67 year old [place], [place] woman  #1  status post mastectomy with right axillary lymph node dissection 05/10/2012 for an mpT2 N1, stage stage IIB invasive lobular breast cancer, stage IIB, ER positive, HER-2/neu positive, with an Mib-1 between 5% and 44%  #2 receiving adjuvant chemotherapy consisting of Taxotere, carboplatinum and Herceptin given every 21 days for total of 6 cycles of Taxotere carboplatinum and weekly Herceptin beginning on 06/14/12.  Adjuvant every three week herceptin began on 10/04/12, and radiation therapy began on 10/25/12.    #3 Herceptin to be continued to total one year; most recent echo 09/17/12.  PLAN:   1. Ms. Enerson is doing well. She will proceed with Herceptin tomorrow and continue daily radiation.  I encouraged her to show Dr. Dayton Scrape the lesion on her chest wall for him to evaluate since it is in the radiation field.    2. She will continue to take Super B complex daily. She declined Neurontin therapy.  We discussed this again today, and she again declined Neurontin therapy.    3. Ms. Resch will return in three weeks for her next Herceptin therapy.    4. Ms. Pumphrey is doing well with Lasix PRN.  She will return to Dr. Gala Romney in October.   All questions were answered. The patient knows to call the clinic with any problems, questions or concerns. We can certainly see the patient much sooner if necessary.   I spent 25 minutes counseling the patient face to face. The total time spent in the appointment was 30 minutes.  Cherie Ouch Lyn Hollingshead, NP Medical Oncology [facility] Phone: [phone] 12/05/2012, 11:12 AM

## 2012-12-06 ENCOUNTER — Other Ambulatory Visit: Payer: Medicare Other | Admitting: Lab

## 2012-12-06 ENCOUNTER — Ambulatory Visit (HOSPITAL_BASED_OUTPATIENT_CLINIC_OR_DEPARTMENT_OTHER): Payer: Medicare Other

## 2012-12-06 ENCOUNTER — Ambulatory Visit
Admission: RE | Admit: 2012-12-06 | Discharge: 2012-12-06 | Disposition: A | Discharge status: Home / Self Care | Payer: Medicare Other | Source: Ambulatory Visit | Attending: Radiation Oncology | Admitting: Radiation Oncology

## 2012-12-06 VITALS — BP 141/66 | HR 75 | Temp 97.5°F

## 2012-12-06 DIAGNOSIS — C50411 Malignant neoplasm of upper-outer quadrant of right female breast: Secondary | ICD-10-CM

## 2012-12-06 DIAGNOSIS — C50419 Malignant neoplasm of upper-outer quadrant of unspecified female breast: Secondary | ICD-10-CM

## 2012-12-06 DIAGNOSIS — Z5112 Encounter for antineoplastic immunotherapy: Secondary | ICD-10-CM

## 2012-12-06 LAB — COMPREHENSIVE METABOLIC PANEL (CC13)
ALT: 20 U/L (ref 0–55)
AST: 24 U/L (ref 5–34)
Albumin: 3.9 g/dL (ref 3.5–5.0)
Alkaline Phosphatase: 86 U/L (ref 40–150)
BUN: 18.8 mg/dL (ref 7.0–26.0)
CO2: 27 mEq/L (ref 22–29)
Calcium: 9.8 mg/dL (ref 8.4–10.4)
Chloride: 100 mEq/L (ref 98–109)
Creatinine: 0.9 mg/dL (ref 0.6–1.1)
Glucose: 98 mg/dl (ref 70–140)
Potassium: 3.9 mEq/L (ref 3.5–5.1)
Sodium: 136 mEq/L (ref 136–145)
Total Bilirubin: 0.65 mg/dL (ref 0.20–1.20)
Total Protein: 7.2 g/dL (ref 6.4–8.3)

## 2012-12-06 MED ORDER — SODIUM CHLORIDE 0.9 % IV SOLN
Freq: Once | INTRAVENOUS | Status: AC
  Administered 2012-12-06: 11:00:00 via INTRAVENOUS

## 2012-12-06 MED ORDER — TRASTUZUMAB CHEMO INJECTION 440 MG
6.0000 mg/kg | Freq: Once | INTRAVENOUS | Status: AC
  Administered 2012-12-06: 378 mg via INTRAVENOUS
  Filled 2012-12-06: qty 18

## 2012-12-06 MED ORDER — DIPHENHYDRAMINE HCL 25 MG PO CAPS
50.0000 mg | ORAL_CAPSULE | Freq: Once | ORAL | Status: AC
  Administered 2012-12-06: 25 mg via ORAL

## 2012-12-06 MED ORDER — ACETAMINOPHEN 325 MG PO TABS
650.0000 mg | ORAL_TABLET | Freq: Once | ORAL | Status: AC
  Administered 2012-12-06: 650 mg via ORAL

## 2012-12-06 MED ORDER — SODIUM CHLORIDE 0.9 % IJ SOLN
10.0000 mL | INTRAMUSCULAR | Status: DC | PRN
  Administered 2012-12-06: 10 mL
  Filled 2012-12-06: qty 10

## 2012-12-06 MED ORDER — HEPARIN SOD (PORK) LOCK FLUSH 100 UNIT/ML IV SOLN
500.0000 [IU] | Freq: Once | INTRAVENOUS | Status: AC | PRN
  Administered 2012-12-06: 500 [IU]
  Filled 2012-12-06: qty 5

## 2012-12-06 NOTE — Patient Instructions (Addendum)
[facility] Discharge Instructions for Patients Receiving Chemotherapy  Today you received Herceptin.  If you develop any of the following, please call the clinic.   BELOW ARE SYMPTOMS THAT SHOULD BE REPORTED IMMEDIATELY:  *FEVER GREATER THAN 100.5 F  *CHILLS WITH OR WITHOUT FEVER  NAUSEA AND VOMITING THAT IS NOT CONTROLLED WITH YOUR NAUSEA MEDICATION  *UNUSUAL SHORTNESS OF BREATH  *UNUSUAL BRUISING OR BLEEDING  TENDERNESS IN MOUTH AND THROAT WITH OR WITHOUT PRESENCE OF ULCERS  *URINARY PROBLEMS  *BOWEL PROBLEMS  UNUSUAL RASH Items with * indicate a potential emergency and should be followed up as soon as possible.  Feel free to call the clinic you have any questions or concerns. The clinic phone number is [phone].   Trastuzumab injection for infusion What is this medicine? TRASTUZUMAB (tras TOO zoo mab) is a monoclonal antibody. It targets a protein called HER2. This protein is found in some stomach and breast cancers. This medicine can stop cancer cell growth. This medicine may be used with other cancer treatments. This medicine may be used for other purposes; ask your health care provider or pharmacist if you have questions. What should I tell my health care provider before I take this medicine? They need to know if you have any of these conditions: -heart disease -heart failure -infection (especially a virus infection such as chickenpox, cold sores, or herpes) -lung or breathing disease, like asthma -recent or ongoing radiation therapy -an unusual or allergic reaction to trastuzumab, benzyl alcohol, or other medications, foods, dyes, or preservatives -pregnant or trying to get pregnant -breast-feeding How should I use this medicine? This drug is given as an infusion into a vein. It is administered in a hospital or clinic by a specially trained health care professional. Talk to your pediatrician regarding the use of this medicine in children.  This medicine is not approved for use in children. Overdosage: If you think you have taken too much of this medicine contact a poison control center or emergency room at once. NOTE: This medicine is only for you. Do not share this medicine with others. What if I miss a dose? It is important not to miss a dose. Call your doctor or health care professional if you are unable to keep an appointment. What may interact with this medicine? -cyclophosphamide -doxorubicin -warfarin This list may not describe all possible interactions. Give your health care provider a list of all the medicines, herbs, non-prescription drugs, or dietary supplements you use. Also tell them if you smoke, drink alcohol, or use illegal drugs. Some items may interact with your medicine. What should I watch for while using this medicine? Visit your doctor for checks on your progress. Report any side effects. Continue your course of treatment even though you feel ill unless your doctor tells you to stop. Call your doctor or health care professional for advice if you get a fever, chills or sore throat, or other symptoms of a cold or flu. Do not treat yourself. Try to avoid being around people who are sick. You may experience fever, chills and shaking during your first infusion. These effects are usually mild and can be treated with other medicines. Report any side effects during the infusion to your health care professional. Fever and chills usually do not happen with later infusions. What side effects may I notice from receiving this medicine? Side effects that you should report to your doctor or other health care professional as soon as possible: -breathing difficulties -chest pain or  palpitations -cough -dizziness or fainting -fever or chills, sore throat -skin rash, itching or hives -swelling of the legs or ankles -unusually weak or tired Side effects that usually do not require medical attention (report to your doctor or  other health care professional if they continue or are bothersome): -loss of appetite -headache -muscle aches -nausea This list may not describe all possible side effects. Call your doctor for medical advice about side effects. You may report side effects to FDA at 1-800-FDA-1088. Where should I keep my medicine? This drug is given in a hospital or clinic and will not be stored at home. NOTE: This sheet is a summary. It may not cover all possible information. If you have questions about this medicine, talk to your doctor, pharmacist, or health care provider.  2013, [employer]. (02/27/2009 1:43:15 PM)

## 2012-12-06 NOTE — Progress Notes (Signed)
Ms. Hillenburg in nursing today with concerns about ? nodule in the right upper inner portion of her tx field which Dr. Dayton Scrape feels is a suture from her piror surgery.  She completes today.

## 2012-12-06 NOTE — Progress Notes (Signed)
The patient is seen today after her treatment to evaluate a small nodule felt along the right parasternal region.  Physical examination: There is erythema along the right chest wall. There is a to 3 mm from mass along the right parasternal region adjacent to her mastectomy scar which I feel represents a suture/suture granuloma.  The patient was reassured.

## 2012-12-07 ENCOUNTER — Ambulatory Visit
Admission: RE | Admit: 2012-12-07 | Discharge: 2012-12-07 | Disposition: A | Discharge status: Home / Self Care | Payer: Medicare Other | Source: Ambulatory Visit | Attending: Radiation Oncology | Admitting: Radiation Oncology

## 2012-12-10 ENCOUNTER — Ambulatory Visit
Admission: RE | Admit: 2012-12-10 | Discharge: 2012-12-10 | Disposition: A | Discharge status: Home / Self Care | Payer: Medicare Other | Source: Ambulatory Visit | Attending: Radiation Oncology | Admitting: Radiation Oncology

## 2012-12-10 VITALS — BP 132/59 | HR 70 | Temp 97.5°F | Ht 64.0 in | Wt 141.5 lb

## 2012-12-10 DIAGNOSIS — C50411 Malignant neoplasm of upper-outer quadrant of right female breast: Secondary | ICD-10-CM

## 2012-12-10 NOTE — Progress Notes (Signed)
Weekly Management Note:  Site: Right chest wall mastectomy scar boost Current Dose:  5840  cGy Projected Dose: 6040  cGy  Narrative: The patient is seen today for routine under treatment assessment. CBCT/MVCT images/port films were reviewed. The chart was reviewed.   She is without new complaints today. She still has actually discomfort. She uses Biafine cream. She finishes her radiation therapy tomorrow.  Physical Examination:  Filed Vitals:   12/10/12 0946  BP: 132/59  Pulse: 70  Temp: 97.5 F (36.4 C)  .  Weight: 141 lb 8 oz (64.184 kg). There is erythema along the right chest wall and regional lymph nodes. There is extensive dry desquamation, particularly along the right axilla. No areas of moist desquamation.  Impression: Tolerating radiation therapy well. She'll finish her radiation therapy tomorrow.  Plan: Continue radiation therapy as planned. One-month followup visit after completion of radiation therapy.

## 2012-12-10 NOTE — Progress Notes (Signed)
Heather [name] here for weekly under treat visit.  She has had 32 fractions to her right chest.  She does have discomfort under her right arm.  The skin on her chest and under her right arm is red and peeling.  She also has a red area on her right upper back.  She has been applying biafine cream 2-3 times per day.  She does have fatigue.

## 2012-12-11 ENCOUNTER — Encounter: Payer: Self-pay | Admitting: Radiation Oncology

## 2012-12-11 ENCOUNTER — Ambulatory Visit
Admission: RE | Admit: 2012-12-11 | Discharge: 2012-12-11 | Disposition: A | Discharge status: Home / Self Care | Payer: Medicare Other | Source: Ambulatory Visit | Attending: Radiation Oncology | Admitting: Radiation Oncology

## 2012-12-11 NOTE — Progress Notes (Signed)
[facility] Radiation Oncology End of Treatment Note  Name:Heather [name]  Date: 12/11/2012 UJW:[id number] DOB:18-Apr-1946   Status:outpatient    CC: [name], MD  Dr. Chevis Pretty III  REFERRING PHYSICIAN:   Dr. Chevis Pretty III    DIAGNOSIS: Pathologic stage IIB (T2 N1 M0) invasive lobular carcinoma the right breast   INDICATION FOR TREATMENT: Curative   TREATMENT DATES: 10/25/2012 through 12/11/2012                          SITE/DOSE:   Right chest wall/regional lymph nodes 5040 cGy 28 sessions, right chest wall boost 1000 cGy I sessions.                         BEAMS/ENERGY:   6 MV photons tangential fields to the right chest wall with 1.0 cm bolus applied to the skin every other day. 6 MV photons PA right axillary boost to bring the mid plane dose up to 4600 cGy in 28 sessions. 10 MV photons LAO to the right supraclavicular/axillary region, doses prescribed at 3 cm depth. 6 MEV electrons, right chest wall/mastectomy scar boost with 0.8 cm of bolus applied to the skin to maximize the dose to the skin surface.                NARRATIVE:    She tolerated treatment well with the expected degree of marked erythema and patchy dry desquamation the skin by completion of therapy. She initially used Radioplex gel, and later Biafine cream for radiation dermatitis.                        PLAN: Routine followup in one month. Patient instructed to call if questions or worsening complaints in interim.

## 2012-12-12 ENCOUNTER — Other Ambulatory Visit (HOSPITAL_COMMUNITY): Payer: Self-pay | Admitting: Adult Health

## 2012-12-12 ENCOUNTER — Other Ambulatory Visit: Payer: Self-pay

## 2012-12-27 ENCOUNTER — Other Ambulatory Visit (HOSPITAL_BASED_OUTPATIENT_CLINIC_OR_DEPARTMENT_OTHER): Payer: Medicare Other | Admitting: Lab

## 2012-12-27 ENCOUNTER — Ambulatory Visit (HOSPITAL_BASED_OUTPATIENT_CLINIC_OR_DEPARTMENT_OTHER): Payer: Medicare Other

## 2012-12-27 ENCOUNTER — Ambulatory Visit (HOSPITAL_BASED_OUTPATIENT_CLINIC_OR_DEPARTMENT_OTHER): Payer: Medicare Other | Admitting: Adult Health

## 2012-12-27 ENCOUNTER — Encounter: Payer: Self-pay | Admitting: Adult Health

## 2012-12-27 VITALS — BP 140/80 | HR 70 | Temp 98.8°F | Resp 20 | Ht 64.0 in | Wt 141.9 lb

## 2012-12-27 DIAGNOSIS — C50411 Malignant neoplasm of upper-outer quadrant of right female breast: Secondary | ICD-10-CM

## 2012-12-27 DIAGNOSIS — C773 Secondary and unspecified malignant neoplasm of axilla and upper limb lymph nodes: Secondary | ICD-10-CM

## 2012-12-27 DIAGNOSIS — Z5112 Encounter for antineoplastic immunotherapy: Secondary | ICD-10-CM

## 2012-12-27 DIAGNOSIS — C50419 Malignant neoplasm of upper-outer quadrant of unspecified female breast: Secondary | ICD-10-CM

## 2012-12-27 DIAGNOSIS — N3941 Urge incontinence: Secondary | ICD-10-CM

## 2012-12-27 DIAGNOSIS — Z170 Estrogen receptor positive status [ER+]: Secondary | ICD-10-CM

## 2012-12-27 LAB — CBC WITH DIFFERENTIAL/PLATELET
BASO%: 0.7 % (ref 0.0–2.0)
Basophils Absolute: 0 10*3/uL (ref 0.0–0.1)
EOS%: 1.8 % (ref 0.0–7.0)
Eosinophils Absolute: 0.1 10*3/uL (ref 0.0–0.5)
HCT: 36.8 % (ref 34.8–46.6)
HGB: 12.5 g/dL (ref 11.6–15.9)
LYMPH%: 27.5 % (ref 14.0–49.7)
MCH: 30 pg (ref 25.1–34.0)
MCHC: 34 g/dL (ref 31.5–36.0)
MCV: 88.2 fL (ref 79.5–101.0)
MONO#: 0.3 10*3/uL (ref 0.1–0.9)
MONO%: 9.9 % (ref 0.0–14.0)
NEUT#: 1.6 10*3/uL (ref 1.5–6.5)
NEUT%: 60.1 % (ref 38.4–76.8)
Platelets: 140 10*3/uL — ABNORMAL LOW (ref 145–400)
RBC: 4.17 10*6/uL (ref 3.70–5.45)
RDW: 13 % (ref 11.2–14.5)
WBC: 2.7 10*3/uL — ABNORMAL LOW (ref 3.9–10.3)
lymph#: 0.8 10*3/uL — ABNORMAL LOW (ref 0.9–3.3)
nRBC: 0 % (ref 0–0)

## 2012-12-27 LAB — COMPREHENSIVE METABOLIC PANEL (CC13)
ALT: 21 U/L (ref 0–55)
AST: 23 U/L (ref 5–34)
Albumin: 3.9 g/dL (ref 3.5–5.0)
Alkaline Phosphatase: 83 U/L (ref 40–150)
BUN: 14.1 mg/dL (ref 7.0–26.0)
CO2: 24 mEq/L (ref 22–29)
Calcium: 9.7 mg/dL (ref 8.4–10.4)
Chloride: 101 mEq/L (ref 98–109)
Creatinine: 0.9 mg/dL (ref 0.6–1.1)
Glucose: 92 mg/dl (ref 70–140)
Potassium: 3.9 mEq/L (ref 3.5–5.1)
Sodium: 136 mEq/L (ref 136–145)
Total Bilirubin: 0.45 mg/dL (ref 0.20–1.20)
Total Protein: 7.3 g/dL (ref 6.4–8.3)

## 2012-12-27 LAB — URINALYSIS, MICROSCOPIC - CHCC
Bilirubin (Urine): NEGATIVE
Blood: NEGATIVE
Glucose: NEGATIVE mg/dL
Ketones: NEGATIVE mg/dL
Leukocyte Esterase: NEGATIVE
Nitrite: NEGATIVE
Protein: NEGATIVE mg/dL
RBC / HPF: NEGATIVE (ref 0–2)
Specific Gravity, Urine: 1.005 (ref 1.003–1.035)
Urobilinogen, UR: 0.2 mg/dL (ref 0.2–1)
WBC, UA: NEGATIVE (ref 0–2)
pH: 7.5 (ref 4.6–8.0)

## 2012-12-27 MED ORDER — TRASTUZUMAB CHEMO INJECTION 440 MG
6.0000 mg/kg | Freq: Once | INTRAVENOUS | Status: AC
  Administered 2012-12-27: 378 mg via INTRAVENOUS
  Filled 2012-12-27: qty 18

## 2012-12-27 MED ORDER — SODIUM CHLORIDE 0.9 % IJ SOLN
10.0000 mL | INTRAMUSCULAR | Status: DC | PRN
  Administered 2012-12-27: 10 mL
  Filled 2012-12-27: qty 10

## 2012-12-27 MED ORDER — ACETAMINOPHEN 325 MG PO TABS
650.0000 mg | ORAL_TABLET | Freq: Once | ORAL | Status: AC
  Administered 2012-12-27: 650 mg via ORAL

## 2012-12-27 MED ORDER — DIPHENHYDRAMINE HCL 25 MG PO CAPS
50.0000 mg | ORAL_CAPSULE | Freq: Once | ORAL | Status: AC
  Administered 2012-12-27: 25 mg via ORAL

## 2012-12-27 MED ORDER — SODIUM CHLORIDE 0.9 % IV SOLN
Freq: Once | INTRAVENOUS | Status: AC
  Administered 2012-12-27: 12:00:00 via INTRAVENOUS

## 2012-12-27 MED ORDER — HEPARIN SOD (PORK) LOCK FLUSH 100 UNIT/ML IV SOLN
500.0000 [IU] | Freq: Once | INTRAVENOUS | Status: AC | PRN
  Administered 2012-12-27: 500 [IU]
  Filled 2012-12-27: qty 5

## 2012-12-27 NOTE — Patient Instructions (Addendum)
[facility] Discharge Instructions for Patients Receiving Chemotherapy  Today you received the following chemotherapy agents: herceptin  To help prevent nausea and vomiting after your treatment, we encourage you to take your nausea medication.  Take it as often as prescribed.     If you develop nausea and vomiting that is not controlled by your nausea medication, call the clinic. If it is after clinic hours your family physician or the after hours number for the clinic or go to the Emergency Department.   BELOW ARE SYMPTOMS THAT SHOULD BE REPORTED IMMEDIATELY:  *FEVER GREATER THAN 100.5 F  *CHILLS WITH OR WITHOUT FEVER  NAUSEA AND VOMITING THAT IS NOT CONTROLLED WITH YOUR NAUSEA MEDICATION  *UNUSUAL SHORTNESS OF BREATH  *UNUSUAL BRUISING OR BLEEDING  TENDERNESS IN MOUTH AND THROAT WITH OR WITHOUT PRESENCE OF ULCERS  *URINARY PROBLEMS  *BOWEL PROBLEMS  UNUSUAL RASH Items with * indicate a potential emergency and should be followed up as soon as possible.  Feel free to call the clinic you have any questions or concerns. The clinic phone number is [phone].   I have been informed and understand all the instructions given to me. I know to contact the clinic, my physician, or go to the Emergency Department if any problems should occur. I do not have any questions at this time, but understand that I may call the clinic during office hours   should I have any questions or need assistance in obtaining follow up care.    __________________________________________  _____________  __________ Signature of Patient or Authorized Representative            Date                   Time    __________________________________________ Nurse's Signature    

## 2012-12-27 NOTE — Progress Notes (Signed)
OFFICE PROGRESS NOTE   [name], MD [address] [place] [address] Dr. Chevis Pretty Dr. Chipper Herb  DIAGNOSIS: 67 year old female with invasive lobular breast cancer, stage IIB, receiving adjuvant chemotherapy  PRIOR THERAPY:  #1 patient was originally seen in the multidisciplinary breast clinic on 03/14/2012 for new diagnosis of invasive ductal carcinoma that was HER-2 positive node-positive, ER positive PR negative with Ki-67 of 44%  #2 on MRI patient also had an anterior breast mass that was to be biopsied to decide whether or not she should receive neoadjuvant chemotherapy or adjuvant chemotherapy. Patient's mass was biopsied and it was positive for a malignancy that was HER-2/neu negative.  #3 patient is now status post right mastectomy with axillary lymph node dissection with the final pathology revealing the largest tumor measuring 5 cm, 1.7 cm and 1.6 cm. Tumor was ER positive PR negative HER-2/neu positive with a Ki-67 of 44% 3 of 7 lymph nodes were positive for metastatic disease.  #4 patient will proceed with adjuvant chemotherapy and Herceptin. Chemotherapy will consist of Taxotere carboplatinum given every 21 days with weekly Herceptin. Total of 6 cycles of Taxotere carboplatin will be administered starting on 06/14/2012. She began adjuvant every 3 week herceptin on 5/29.  Because patient's tumor is ER positive she will also be given antiestrogen therapy adjuvantly.  #5 Radiation therapy beginning 10/25/12, finished on 12/11/12.    CURRENT THERAPY: Every 3 week Herceptin and radiation therapy  INTERVAL HISTORY: Ms. Grega is here for f/u prior to her adjuvant treatment of her right breast cancer.  She is doing well today.  She denies fevers, chills, nausea, vomiting, chest pain, swelling, palpitations or any other concerns.     MEDICAL HISTORY: Past Medical History  Diagnosis Date  . Chronic kidney disease   . Hx: UTI (urinary tract infection)   . Right  shoulder pain   . Right knee pain   . Hypertension     recently started Aldactone   . Hyperlipidemia     but not on meds;diet and exercise controlled  . Dizziness     has been going on for 92months;medical MD aware  . H/O hiatal hernia   . GERD (gastroesophageal reflux disease)     Tums prn  . Hemorrhoids   . Urinary frequency     d/t taking Aldactone  . History of UTI   . Anemia     hx of  . Cataract     immature-not sure of which eye  . Anxiety     r/t updated surgery  . Insomnia     takes Melatoniin daily  . Complication of anesthesia     pt states she is sensitive to meds  . Breast cancer     right  . Skin cancer     ALLERGIES:  is allergic to codeine; keflex; naprosyn; and tussin.  MEDICATIONS:  Current Outpatient Prescriptions  Medication Sig Dispense Refill  . B Complex-C (SUPER B COMPLEX PO) Take 1 each by mouth daily.      . calcium carbonate (TUMS - DOSED IN MG ELEMENTAL CALCIUM) 500 MG chewable tablet Chew 1 tablet by mouth daily.      . Cholecalciferol (VITAMIN D-3 PO) Take 4,000 Units by mouth daily.      . clindamycin (CLINDAGEL) 1 % gel Apply topically 2 (two) times daily.  30 g  0  . doxycycline (VIBRA-TABS) 100 MG tablet Take 1 tablet (100 mg total) by mouth 2 (two) times daily.  20  tablet  0  . emollient (BIAFINE) cream Apply 1 application topically as needed.      . furosemide (LASIX) 20 MG tablet Take 1 tablet (20 mg total) by mouth daily as needed.  30 tablet  3  . gabapentin (NEURONTIN) 100 MG capsule Take 1 capsule (100 mg total) by mouth 3 (three) times daily.  90 capsule  2  . hyaluronate sodium (RADIAPLEXRX) GEL Apply 1 application topically 2 (two) times daily. 2nd tube 11/19/12      . lidocaine-prilocaine (EMLA) cream Apply topically as needed.  30 g  7  . magnesium gluconate (MAGONATE) 500 MG tablet Take 500 mg by mouth 2 (two) times daily.      . Olopatadine HCl 0.2 % SOLN Apply 1 drop to eye 2 (two) times daily.  2.5 mL  0  . omeprazole  (PRILOSEC) 40 MG capsule Take 1 capsule (40 mg total) by mouth daily.  30 capsule  6  . potassium chloride SA (K-DUR,KLOR-CON) 20 MEQ tablet Take 1 tablet (20 mEq total) by mouth daily as needed. When you take Furosemide  90 tablet  3  . PREVIDENT 5000 BOOSTER PLUS 1.1 % PSTE Take 1 application by mouth Twice daily.      . Probiotic Product (PROBIOTIC DAILY PO) Take 1 each by mouth.      . spironolactone (ALDACTONE) 25 MG tablet TAKE ONE-HALF TABLET (12.5MG ) BY MOUTH EVERY DAY  30 tablet  3   No current facility-administered medications for this visit.    SURGICAL HISTORY:  Past Surgical History  Procedure Laterality Date  . Cyst removed from left breast  1970  . Left wrist surgery   2004    with plate  . Colonoscopy    . Esophagogastroduodenoscopy    . Mastectomy, radical    . Mastectomy modified radical  05/10/2012    Procedure: MASTECTOMY MODIFIED RADICAL;  Surgeon: Robyne Askew, MD;  Location: [facility] OR;  Service: General;  Laterality: Right;  RIGHT MODIFIED RADICAL MASTECTOMY  . Portacath placement  05/10/2012    Procedure: INSERTION PORT-A-CATH;  Surgeon: Robyne Askew, MD;  Location: [facility] OR;  Service: General;  Laterality: Left;    REVIEW OF SYSTEMS:  General: fatigue (+), night sweats (-), fever (-), pain (-) Lymph: palpable nodes (-) HEENT: vision changes (-), mucositis (-), gum bleeding (-), epistaxis (-) Cardiovascular: chest pain (-), palpitations (-) Pulmonary: shortness of breath (-), dyspnea on exertion (-), cough (-), hemoptysis (-) GI:  Early satiety (-), melena (-), dysphagia (-), nausea/vomiting (-), diarrhea (-) GU: dysuria (-), hematuria (-), incontinence (-) Musculoskeletal: joint swelling (-), joint pain (-), back pain (-) Neuro: weakness (-), numbness (+), headache (-), confusion (-) Skin: Rash (-), lesions (-), dryness (-) Psych: depression (-), suicidal/homicidal ideation (-), feeling of hopelessness (-)  PHYSICAL EXAMINATION: Blood pressure 140/80, pulse  70, temperature 98.8 F (37.1 C), temperature source Oral, resp. rate 20, height 5\' 4"  (1.626 m), weight 141 lb 14.4 oz (64.365 kg). Body mass index is 24.34 kg/(m^2). General: Patient is a well appearing female in no acute distress HEENT: PERRLA, sclerae anicteric no conjunctival pallor, MMM Neck: supple, no palpable adenopathy Lungs: clear to auscultation bilaterally, no wheezes, rhonchi, or rales Cardiovascular: regular rate rhythm, S1, S2, no murmurs, rubs or gallops Abdomen: Soft, non-tender, non-distended, normoactive bowel sounds, no HSM Extremities: warm and well perfused, no clubbing, cyanosis, or edema Skin: No rashes or lesions, pustular rash on scalp Neuro: Non-focal Breasts: The right breast is status  post mastectomy. There is no evidence of local recurrence. The right axilla is benign.  Erythema to right chest wall. The left breast is unremarkable. ECOG PERFORMANCE STATUS: 1   LABORATORY DATA: Lab Results  Component Value Date   WBC 2.7* 12/27/2012   HGB 12.5 12/27/2012   HCT 36.8 12/27/2012   MCV 88.2 12/27/2012   PLT 140* 12/27/2012      Chemistry      Component Value Date/Time   NA 136 12/27/2012 1006   NA 138 05/11/2012 0640   K 3.9 12/27/2012 1006   K 4.7 05/11/2012 0640   CL 101 10/25/2012 0946   CL 99 05/11/2012 0640   CO2 24 12/27/2012 1006   CO2 20 05/11/2012 0640   BUN 14.1 12/27/2012 1006   BUN 12 05/11/2012 0640   CREATININE 0.9 12/27/2012 1006   CREATININE 0.88 05/11/2012 0640      Component Value Date/Time   CALCIUM 9.7 12/27/2012 1006   CALCIUM 9.7 05/11/2012 0640   ALKPHOS 83 12/27/2012 1006   AST 23 12/27/2012 1006   ALT 21 12/27/2012 1006   BILITOT 0.45 12/27/2012 1006     ADDITIONAL INFORMATION: CHROMOGENIC IN-SITU HYBRIDIZATION (1C - TUMOR WITH CLIP) Interpretation HER-2/NEU BY CISH - NO AMPLIFICATION OF HER-2 DETECTED. THE RATIO OF HER-2: CEP 17 SIGNALS WAS 1.25. Reference range: Ratio: HER2:CEP17 < 1.8 - gene amplification not observed Ratio: HER2:CEP 17  1.8-2.2 - equivocal result Ratio: HER2:CEP17 > 2.2 - gene amplification observed CHROMOGENIC IN-SITU HYBRIDIZATION (1E - TUMOR WITHOUT CLIP) Interpretation: HER2/NEU BY CISH - SHOWS AMPLIFICATION BY CISH ANALYSIS. THE RATIO OF HER2: CEP 17 SIGNALS WAS 2.83. Reference range: Ratio: HER2:CEP17 < 1.8 gene amplification not observed Ratio: HER2:CEP 17 1.8-2.2 - equivocal result Ratio: HER2:CEP17 > 2.2 - gene amplification observed Pecola Leisure MD Pathologist, Electronic Signature ( Signed 05/15/2012) FINAL DIAGNOSIS Diagnosis Breast, modified radical mastectomy , right, with axillary contents 1 of 4 FINAL for NELSIE, DOMINO S (SZA14-4) Diagnosis(continued) TUMOR #1 (WITH CLIP): - INVASIVE LOBULAR CARCINOMA, GRADE II, SEE COMMENT. - LYMPHOVASCULAR INVASION IDENTIFIED. - PREVIOUS BIOPSY SITE IDENTIFIED. TUMOR #2 (NO CLIP): - INVASIVE LOBULAR CARCINOMA, GRADE II, SEE COMMENT. - LOBULAR CARCINOMA IN SITU. - LYMPHOVASCULAR INVASION IDENTIFIED. - THREE LYMPH NODES, POSITIVE FOR METASTATIC MAMMARY CARCINOMA (3/7). - TUMOR DEPOSITS ARE 1.7, 1.6, AND 5 CM. - EXTRACAPSULAR TUMOR EXTENSION IDENTIFIED. - TWO LYMPH NODES, POSITIVE FOR ISOLATED TUMOR CELLS. Microscopic Comment BREAST, INVASIVE TUMOR, WITH LYMPH NODE SAMPLING Specimen, including laterality: Right breast Procedure: Modified radical mastectomy Grade: Tumor 1 and tumor 2: Grade II of III Tubule formation: 3 Nuclear pleomorphism: 2 Mitotic: 1 Tumor size (gross measurement: Tumor #1- 2.3 cm; tumor #2 - 2.4 cm Margins: Invasive, distance to closest margin: Tumor #1 2.7 cm - deep margin; tumor #2 - 1.6 cm - deep margin Lymphovascular invasion: Present Ductal carcinoma in situ: Absent Grade: N/A Extensive intraductal component: N/A Lobular neoplasia: Both atypical hyperplasia and in situ carcinoma Extent of tumor: Skin: Grossly negative Nipple: Grossly negative Skeletal muscle: Microscopically negative Lymph nodes: # examined:  7 Lymph nodes with metastasis: 3 Isolated tumor cells (< 0.2 mm): two lymph nodes (Slide 1P and 1O) Micrometastasis: (> 0.2 mm and < 2.0 mm): none Macrometastasis: (> 2.0 mm): 1.7 cm , 1.6 cm and 5 cm Extracapsular extension: Present Breast prognostic profile: Tumor #1: Estrogen receptor: Not repeated, previous study demonstrated 98% positivity ([id number]) Progesterone receptor: Not repeated, previous study demonstrated 0% positivity ([id number]) Her 2 neu: Repeated, previous study demonstrated no amplification (  1.38) ([id number]) Ki-67: Not repeated, previous study demonstrated 5% proliferation rate ([id number]) Tumor #2: Estrogen receptor: Not repeated, previous study demonstrated 97% positivity ([id number]) Progesterone receptor: Not repeated, previous study demonstrated 0% positivity ([id number]) Her 2 neu: Repeated, previous study demonstrated amplification (3.65) ([id number]) Ki-67: Not repeated, previous study demonstrated 44% proliferation rate ([id number]) 2 of 4 FINAL for JOUA, BAKE (SZA14-4) Microscopic Comment(continued) Non-neoplastic breast: Benign fibrocystic change with usual ductal hyperplasia, sclerosing adenosis, benign radial sclerosing lesion, and microcalcifications in benign ducts and lobules. TNM: mpT2, pN1a, pMX. Comments: none  RADIOGRAPHIC STUDIES:  Mr Lodema Pilot Contrast  03/23/2012  *RADIOLOGY REPORT*  Clinical Data: New diagnosis breast cancer.  Rule out metastatic disease.  MRI HEAD WITHOUT AND WITH CONTRAST  Technique:  Multiplanar, multiecho pulse sequences of the brain and surrounding structures were obtained according to standard protocol without and with intravenous contrast  Contrast: 12mL MULTIHANCE GADOBENATE DIMEGLUMINE 529 MG/ML IV SOLN  Comparison: None.  Findings: Ventricle size is normal.  Small 4 mm hyperintensities in the cerebral white matter bilaterally do not enhance and are most likely due to chronic microvascular ischemia.   Negative for acute infarct.  Diffusion weighted imaging is normal.  Pituitary is normal in size.  Negative for intracranial hemorrhage.  Following contrast infusion, no enhancing lesions are identified.  Paranasal sinuses are clear.  Vessels at the base of the brain are patent.  IMPRESSION: Negative for metastatic disease.  No acute abnormality.   Original Report Authenticated By: Janeece Riggers, M.D.    Mr Breast Bilateral W Wo Contrast  03/12/2012  *RADIOLOGY REPORT*  Clinical Data: Recently diagnosed invasive mammary carcinoma in the 10 o'clock region of the right breast with a metastatic right axillary lymph node.  BILATERAL BREAST MRI WITH AND WITHOUT CONTRAST  Technique: Multiplanar, multisequence MR images of both breasts were obtained prior to and following the intravenous administration of 12ml of multihance.  Three dimensional images were evaluated at the independent DynaCad workstation.  Comparison:  Mammograms dated 03/05/2012, 03/01/2012, 03/01/2011 and 02/26/2010 from [facility].  Findings: There is a moderate background parenchymal enhancement pattern.  Right breast: 1.  There is a lobulated 2.2 x 1.7 x 1.8 cm mass in the posterior third of the upper outer quadrant of the right breast.  There is a post biopsy clip artifact 1 cm lateral to the mass. 2.  In the anterior third upper outer quadrant of the right breast there is patchy non mass like enhancement measuring 0.8 x 0.3 x 0.3 cm.  Left breast:  There is no abnormal enhancement the left breast.  There are enlarged right axillary lymph nodes.  The largest right axillary lymph node measures 2.4 cm and is located lateral to the pectoralis minor consistent with level I adenopathy.  There is a 1.6 cm lymph node behind the pectoralis minor consistent with enlarged level II adenopathy.  IMPRESSION:  1.  2.2 cm mass in the posterior third of the upper outer quadrant of the right breast corresponding with the known invasive mammary carcinoma. 2.   Patchy non mass-like enhancement in the anterior third of the upper outer quadrant of the right breast.  MR guided core biopsy is recommended. 3. Enlarged level I and II right axillary adenopathy.  RECOMMENDATION: MR guided core biopsy of the right breast.  THREE-DIMENSIONAL MR IMAGE RENDERING ON INDEPENDENT WORKSTATION:  Three-dimensional MR images were rendered by post-processing of the original MR data on an independent workstation.  The three- dimensional MR images were interpreted, and  findings were reported in the accompanying complete MRI report for this study.  BI-RADS CATEGORY 4:  Suspicious abnormality - biopsy should be considered.   Original Report Authenticated By: Baird Lyons, M.D.    Mr Biopsy/wire Localization  03/20/2012  *RADIOLOGY REPORT*  Clinical Data:  Known right breast carcinoma.  Additional area of worrisome enhancement located within the anterior upper outer quadrant of the right breast.  MRI GUIDED VACUUM ASSISTED BIOPSY OF THE RIGHT BREAST WITHOUT AND WITH CONTRAST  Comparison: Previous exams.  Technique: Multiplanar, multisequence MR images of the right breast were obtained prior to and following the intravenous administration of 12 ml of Mulithance.  I met with the patient, and we discussed the procedure of MRI guided biopsy, including risks, benefits, and alternatives. Specifically, we discussed the risks of infection, bleeding, tissue injury, clip migration, and inadequate sampling.  Informed, written consent was given.  Using sterile technique, 2% Lidocaine, MRI guidance, and a 9 gauge vacuum assisted device, biopsy was performed of the area of enhancement located within the anterior portion of the upper-outer quadrant - right breast using a lateral approach.  At the conclusion of the procedure, a  tissue marker clip was deployed into the biopsy cavity.  IMPRESSION: MRI guided biopsy of the area of enhancement located within the anterior portion of the upper-outer quadrant of the  right breast as discussed above. No apparent complications.  BI-RADS CATEGORY 6:  Known biopsy-proven malignancy - appropriate action should be taken.  THREE-DIMENSIONAL MR IMAGE RENDERING ON INDEPENDENT WORKSTATION:  Three-dimensional MR images were rendered by post-processing of the original MR data on an independent workstation.  The three- dimensional MR images were interpreted, and findings were reported in the accompanying complete MRI report for this study.  The pathology associated with the right breast MR guided core biopsy demonstrated invasive mammary carcinoma and in situ mammary carcinoma.  This is concordant with imaging findings.  I have discussed findings with the patient by telephone and answered her questions.  The patient states that biopsy site is clean and dry without hematoma formation or signs of infection.  Post biopsy wound care instructions were reviewed with the patient.  Follow-up will be with Dr. Carolynne Edouard and Dr. Welton Flakes regards her treatment plan.  The patient was encouraged to call the Breast Center for additional questions or concerns   Original Report Authenticated By: Rolla Plate, M.D.    Nm Pet Image Initial (pi) Skull Base To Thigh  03/22/2012  *RADIOLOGY REPORT*  Clinical Data: Initial treatment strategy for high risk right breast cancer.  NUCLEAR MEDICINE PET SKULL BASE TO THIGH  Fasting Blood Glucose:  100  Technique:  18.6 mCi F-18 FDG was injected intravenously. CT data was obtained and used for attenuation correction and anatomic localization only.  (This was not acquired as a diagnostic CT examination.) Additional exam technical data entered on technologist worksheet.  Comparison:  MRI breast dated 03/12/2012  Findings:  Neck: No hypermetabolic lymph nodes in the neck.  Chest:  Vague hypermetabolism in the upper/central right breast, max SUV 2.7.  Additional focal hypermetabolism in the upper/outer right breast, max SUV 2.3 (PET image 105).  These findings correspond to  biopsy-proven primary breast carcinoma.  1.3 cm short axis right subpectoral lymph node (series 2/image 66), max SUV 3.1.  Right axillary lymphadenopathy measuring up to 1.6 cm short axis (series 2/image 88), max SUV 4.8).  No hypermetabolic mediastinal or hilar nodes.  No suspicious pulmonary nodules on the CT scan.  Abdomen/Pelvis:  No  abnormal hypermetabolic activity within the liver, pancreas, adrenal glands, or spleen.  No hypermetabolic lymph nodes in the abdomen or pelvis.  Skeleton:  No focal hypermetabolic activity to suggest skeletal metastasis.  IMPRESSION: Two foci of hypermetabolism in the upper right breast, max SUV 2.7, corresponding to biopsy-proven primary breast carcinoma.  Associated right subpectoral and axillary nodal metastases, max SUV 4.8.  No evidence of distant metastases.   Original Report Authenticated By: Charline Bills, M.D.    Mm Digital Diagnostic Unilat R  03/20/2012  *RADIOLOGY REPORT*  Clinical Data:  Post right breast MR guided core biopsy.  DIGITAL DIAGNOSTIC RIGHT BREAST MAMMOGRAM  Comparison:  03/05/2012, 03/01/2012, 03/01/2011, 02/26/2010 mammograms from [facility].  Findings:  Films are performed following MR guided biopsy of the anterior portion of the upper-outer quadrant of the right breast. The hourglass shaped clip placed during the MR guided core biopsy appears in appropriate position.  IMPRESSION: Appropriate positioning of clip following right breast MR guided core biopsy.   Original Report Authenticated By: Rolla Plate, M.D.     ASSESSMENT: 67 year old [place], [place] woman  #1  status post mastectomy with right axillary lymph node dissection 05/10/2012 for an mpT2 N1, stage stage IIB invasive lobular breast cancer, stage IIB, ER positive, HER-2/neu positive, with an Mib-1 between 5% and 44%  #2 receiving adjuvant chemotherapy consisting of Taxotere, carboplatinum and Herceptin given every 21 days for total of 6 cycles of Taxotere carboplatinum and  weekly Herceptin beginning on 06/14/12.  Adjuvant every three week herceptin began on 10/04/12, and radiation therapy began on 10/25/12.    #3 Herceptin to be continued to total one year; most recent echo 11/23/12.  PLAN:   1. Ms. Punch is doing well. She will proceed with Herceptin.   We will start Letrozole daily.    2. She will continue to take Super B complex daily. She declined Neurontin therapy.  We discussed this again today, and she again declined Neurontin therapy.    3. Ms. Nehring will return in three weeks for her next Herceptin therapy.    4. Ms. Torrico is doing well with Lasix PRN.  She will return to Dr. Gala Romney in October.   All questions were answered. The patient knows to call the clinic with any problems, questions or concerns. We can certainly see the patient much sooner if necessary.   I spent 25 minutes counseling the patient face to face. The total time spent in the appointment was 30 minutes.  Cherie Ouch Lyn Hollingshead, NP Medical Oncology [facility] Phone: [phone] 12/29/2012, 10:17 AM

## 2012-12-27 NOTE — Patient Instructions (Addendum)
Kegel Exercises The goal of Kegel exercises is to isolate and exercise your pelvic floor muscles. These muscles act as a hammock that supports the rectum, vagina, small intestine, and uterus. As the muscles weaken, the hammock sags and these organs are displaced from their normal positions. Kegel exercises can strengthen your pelvic floor muscles and help you to improve bladder and bowel control, improve sexual response, and help reduce many problems and some discomfort during pregnancy. Kegel exercises can be done anywhere and at any time. HOW TO PERFORM KEGEL EXERCISES 1. Locate your pelvic floor muscles. To do this, squeeze (contract) the muscles that you use when you try to stop the flow of urine. You will feel a tightness in the vaginal area (women) and a tight lift in the rectal area (men and women). 2. When you begin, contract your pelvic muscles tight for 2 5 seconds, then relax them for 2 5 seconds. This is one set. Do 4 5 sets with a short pause in between. 3. Contract your pelvic muscles for 8 10 seconds, then relax them for 8 10 seconds. Do 4 5 sets. If you cannot contract your pelvic muscles for 8 10 seconds, try 5 7 seconds and work your way up to 8 10 seconds. Your goal is 4 5 sets of 10 contractions each day. Keep your stomach, buttocks, and legs relaxed during the exercises. Perform sets of both short and long contractions. Vary your positions. Perform these contractions 3 4 times per day. Perform sets while you are:   Lying in bed in the morning.  Standing at lunch.  Sitting in the late afternoon.  Lying in bed at night. You should do 40 50 contractions per day. Do not perform more Kegel exercises per day than recommended. Overexercising can cause muscle fatigue. Continue these exercises for for at least 15 20 weeks or as directed by your caregiver. Document Released: 04/11/2012 Document Reviewed: 01/19/2012 [employer] Patient Information 2014 [employer].  

## 2012-12-28 LAB — URINE CULTURE

## 2013-01-01 ENCOUNTER — Encounter: Payer: Medicare Other | Admitting: Physical Therapy

## 2013-01-03 ENCOUNTER — Ambulatory Visit: Payer: Medicare Other | Attending: General Surgery | Admitting: Physical Therapy

## 2013-01-03 DIAGNOSIS — M24519 Contracture, unspecified shoulder: Secondary | ICD-10-CM | POA: Insufficient documentation

## 2013-01-03 DIAGNOSIS — IMO0001 Reserved for inherently not codable concepts without codable children: Secondary | ICD-10-CM | POA: Insufficient documentation

## 2013-01-03 DIAGNOSIS — Z9010 Acquired absence of unspecified breast and nipple: Secondary | ICD-10-CM | POA: Insufficient documentation

## 2013-01-03 DIAGNOSIS — N6482 Hypoplasia of breast: Secondary | ICD-10-CM | POA: Insufficient documentation

## 2013-01-03 DIAGNOSIS — I890 Lymphedema, not elsewhere classified: Secondary | ICD-10-CM | POA: Insufficient documentation

## 2013-01-08 ENCOUNTER — Encounter: Payer: Medicare Other | Admitting: Physical Therapy

## 2013-01-10 ENCOUNTER — Encounter: Payer: Medicare Other | Admitting: Physical Therapy

## 2013-01-14 ENCOUNTER — Ambulatory Visit: Payer: Medicare Other | Attending: Oncology | Admitting: Physical Therapy

## 2013-01-14 DIAGNOSIS — M2420 Disorder of ligament, unspecified site: Secondary | ICD-10-CM | POA: Insufficient documentation

## 2013-01-14 DIAGNOSIS — M629 Disorder of muscle, unspecified: Secondary | ICD-10-CM | POA: Insufficient documentation

## 2013-01-14 DIAGNOSIS — IMO0001 Reserved for inherently not codable concepts without codable children: Secondary | ICD-10-CM | POA: Insufficient documentation

## 2013-01-15 ENCOUNTER — Encounter: Payer: Self-pay | Admitting: Radiation Oncology

## 2013-01-17 ENCOUNTER — Ambulatory Visit (HOSPITAL_BASED_OUTPATIENT_CLINIC_OR_DEPARTMENT_OTHER): Payer: Medicare Other

## 2013-01-17 ENCOUNTER — Telehealth: Payer: Self-pay | Admitting: *Deleted

## 2013-01-17 ENCOUNTER — Ambulatory Visit (HOSPITAL_BASED_OUTPATIENT_CLINIC_OR_DEPARTMENT_OTHER): Payer: Medicare Other | Admitting: Adult Health

## 2013-01-17 ENCOUNTER — Encounter: Payer: Self-pay | Admitting: Adult Health

## 2013-01-17 ENCOUNTER — Telehealth: Payer: Self-pay | Admitting: Oncology

## 2013-01-17 ENCOUNTER — Encounter: Payer: Self-pay | Admitting: Oncology

## 2013-01-17 ENCOUNTER — Other Ambulatory Visit (HOSPITAL_BASED_OUTPATIENT_CLINIC_OR_DEPARTMENT_OTHER): Payer: Medicare Other | Admitting: Lab

## 2013-01-17 VITALS — BP 137/85 | HR 76 | Temp 97.5°F | Resp 18 | Ht 64.0 in | Wt 139.6 lb

## 2013-01-17 DIAGNOSIS — C50412 Malignant neoplasm of upper-outer quadrant of left female breast: Secondary | ICD-10-CM

## 2013-01-17 DIAGNOSIS — C50419 Malignant neoplasm of upper-outer quadrant of unspecified female breast: Secondary | ICD-10-CM

## 2013-01-17 DIAGNOSIS — E559 Vitamin D deficiency, unspecified: Secondary | ICD-10-CM

## 2013-01-17 DIAGNOSIS — E785 Hyperlipidemia, unspecified: Secondary | ICD-10-CM

## 2013-01-17 DIAGNOSIS — C50411 Malignant neoplasm of upper-outer quadrant of right female breast: Secondary | ICD-10-CM

## 2013-01-17 DIAGNOSIS — C773 Secondary and unspecified malignant neoplasm of axilla and upper limb lymph nodes: Secondary | ICD-10-CM

## 2013-01-17 DIAGNOSIS — Z170 Estrogen receptor positive status [ER+]: Secondary | ICD-10-CM

## 2013-01-17 DIAGNOSIS — Z5112 Encounter for antineoplastic immunotherapy: Secondary | ICD-10-CM

## 2013-01-17 LAB — CBC WITH DIFFERENTIAL/PLATELET
BASO%: 1 % (ref 0.0–2.0)
Basophils Absolute: 0 10*3/uL (ref 0.0–0.1)
EOS%: 2.2 % (ref 0.0–7.0)
Eosinophils Absolute: 0.1 10*3/uL (ref 0.0–0.5)
HCT: 38.8 % (ref 34.8–46.6)
HGB: 13 g/dL (ref 11.6–15.9)
LYMPH%: 20.1 % (ref 14.0–49.7)
MCH: 29 pg (ref 25.1–34.0)
MCHC: 33.5 g/dL (ref 31.5–36.0)
MCV: 86.6 fL (ref 79.5–101.0)
MONO#: 0.5 10*3/uL (ref 0.1–0.9)
MONO%: 10.9 % (ref 0.0–14.0)
NEUT#: 2.7 10*3/uL (ref 1.5–6.5)
NEUT%: 65.8 % (ref 38.4–76.8)
Platelets: 187 10*3/uL (ref 145–400)
RBC: 4.48 10*6/uL (ref 3.70–5.45)
RDW: 13.2 % (ref 11.2–14.5)
WBC: 4.1 10*3/uL (ref 3.9–10.3)
lymph#: 0.8 10*3/uL — ABNORMAL LOW (ref 0.9–3.3)
nRBC: 0 % (ref 0–0)

## 2013-01-17 LAB — COMPREHENSIVE METABOLIC PANEL (CC13)
ALT: 12 U/L (ref 0–55)
AST: 16 U/L (ref 5–34)
Albumin: 3 g/dL — ABNORMAL LOW (ref 3.5–5.0)
Alkaline Phosphatase: 56 U/L (ref 40–150)
BUN: 12.2 mg/dL (ref 7.0–26.0)
CO2: 23 mEq/L (ref 22–29)
Calcium: 7.5 mg/dL — ABNORMAL LOW (ref 8.4–10.4)
Chloride: 109 mEq/L (ref 98–109)
Creatinine: 0.7 mg/dL (ref 0.6–1.1)
Glucose: 81 mg/dl (ref 70–140)
Potassium: 3.2 mEq/L — ABNORMAL LOW (ref 3.5–5.1)
Sodium: 139 mEq/L (ref 136–145)
Total Bilirubin: 0.38 mg/dL (ref 0.20–1.20)
Total Protein: 5.7 g/dL — ABNORMAL LOW (ref 6.4–8.3)

## 2013-01-17 MED ORDER — TRASTUZUMAB CHEMO INJECTION 440 MG
6.0000 mg/kg | Freq: Once | INTRAVENOUS | Status: AC
  Administered 2013-01-17: 378 mg via INTRAVENOUS
  Filled 2013-01-17: qty 18

## 2013-01-17 MED ORDER — HEPARIN SOD (PORK) LOCK FLUSH 100 UNIT/ML IV SOLN
500.0000 [IU] | Freq: Once | INTRAVENOUS | Status: AC | PRN
  Administered 2013-01-17: 500 [IU]
  Filled 2013-01-17: qty 5

## 2013-01-17 MED ORDER — ACETAMINOPHEN 325 MG PO TABS: 650.0000 mg | ORAL_TABLET | Freq: Once | ORAL | Status: DC

## 2013-01-17 MED ORDER — SODIUM CHLORIDE 0.9 % IV SOLN
Freq: Once | INTRAVENOUS | Status: AC
  Administered 2013-01-17: 11:00:00 via INTRAVENOUS

## 2013-01-17 MED ORDER — DIPHENHYDRAMINE HCL 25 MG PO CAPS
25.0000 mg | ORAL_CAPSULE | Freq: Once | ORAL | Status: AC
  Administered 2013-01-17: 25 mg via ORAL

## 2013-01-17 MED ORDER — SODIUM CHLORIDE 0.9 % IJ SOLN
10.0000 mL | INTRAMUSCULAR | Status: DC | PRN
  Administered 2013-01-17: 10 mL
  Filled 2013-01-17: qty 10

## 2013-01-17 MED ORDER — ACETAMINOPHEN 325 MG PO TABS
ORAL_TABLET | ORAL | Status: AC
  Administered 2013-01-17: 325 mg
  Filled 2013-01-17: qty 2

## 2013-01-17 NOTE — Telephone Encounter (Signed)
Per staff message and POF I have scheduled appts.  JMW  

## 2013-01-17 NOTE — Patient Instructions (Addendum)
[facility] Discharge Instructions for Patients Receiving Chemotherapy  Today you received the following chemotherapy agents :  hERCEPTIN  To help prevent nausea and vomiting after your treatment, we encourage you to take your nausea medicationIf you develop nausea and vomiting that is not controlled by your nausea medication, call the clinic.   BELOW ARE SYMPTOMS THAT SHOULD BE REPORTED IMMEDIATELY:  *FEVER GREATER THAN 100.5 F  *CHILLS WITH OR WITHOUT FEVER  NAUSEA AND VOMITING THAT IS NOT CONTROLLED WITH YOUR NAUSEA MEDICATION  *UNUSUAL SHORTNESS OF BREATH  *UNUSUAL BRUISING OR BLEEDING  TENDERNESS IN MOUTH AND THROAT WITH OR WITHOUT PRESENCE OF ULCERS  *URINARY PROBLEMS  *BOWEL PROBLEMS  UNUSUAL RASH Items with * indicate a potential emergency and should be followed up as soon as possible.  Feel free to call the clinic you have any questions or concerns. The clinic phone number is [phone].

## 2013-01-17 NOTE — Patient Instructions (Signed)
Letrozole tablets What is this medicine? LETROZOLE (LET roe zole) blocks the production of estrogen. Certain types of breast cancer grow under the influence of estrogen. Letrozole helps block tumor growth. This medicine is used to treat advanced breast cancer in postmenopausal women. This medicine may be used for other purposes; ask your health care provider or pharmacist if you have questions. What should I tell my health care provider before I take this medicine? They need to know if you have any of these conditions: -liver disease -osteoporosis (weak bones) -an unusual or allergic reaction to letrozole, other medicines, foods, dyes, or preservatives -pregnant or trying to get pregnant -breast-feeding How should I use this medicine? Take this medicine by mouth with a glass of water. You may take it with or without food. Follow the directions on the prescription label. Take your medicine at regular intervals. Do not take your medicine more often than directed. Do not stop taking except on your doctor's advice. Talk to your pediatrician regarding the use of this medicine in children. Special care may be needed. Overdosage: If you think you have taken too much of this medicine contact a poison control center or emergency room at once. NOTE: This medicine is only for you. Do not share this medicine with others. What if I miss a dose? If you miss a dose, take it as soon as you can. If it is almost time for your next dose, take only that dose. Do not take double or extra doses. What may interact with this medicine? Do not take this medicine with any of the following medications: -estrogens, like hormone replacement therapy or birth control pills This medicine may also interact with the following medications: -dietary supplements such as androstenedione or DHEA -prasterone -tamoxifen This list may not describe all possible interactions. Give your health care provider a list of all the medicines,  herbs, non-prescription drugs, or dietary supplements you use. Also tell them if you smoke, drink alcohol, or use illegal drugs. Some items may interact with your medicine. What should I watch for while using this medicine? Visit your doctor or health care professional for regular check-ups to monitor your condition. Do not use this drug if you are pregnant. Serious side effects to an unborn child are possible. Talk to your doctor or pharmacist for more information. You may get drowsy or dizzy. Do not drive, use machinery, or do anything that needs mental alertness until you know how this medicine affects you. Do not stand or sit up quickly, especially if you are an older patient. This reduces the risk of dizzy or fainting spells. What side effects may I notice from receiving this medicine? Side effects that you should report to your doctor or health care professional as soon as possible: -allergic reactions like skin rash, itching, or hives -bone fracture -chest pain -difficulty breathing or shortness of breath -severe pain, swelling, warmth in the leg -unusually weak or tired -vaginal bleeding Side effects that usually do not require medical attention (report to your doctor or health care professional if they continue or are bothersome): -bone, back, joint, or muscle pain -dizziness -fatigue -fluid retention -headache -hot flashes, night sweats -nausea -weight gain This list may not describe all possible side effects. Call your doctor for medical advice about side effects. You may report side effects to FDA at 1-800-FDA-1088. Where should I keep my medicine? Keep out of the reach of children. Store between 15 and 30 degrees C (59 and 86 degrees F). Throw away   any unused medicine after the expiration date. NOTE: This sheet is a summary. It may not cover all possible information. If you have questions about this medicine, talk to your doctor, pharmacist, or health care provider.  2012,  [employer]. (07/06/2007 4:43:44 PM) 

## 2013-01-17 NOTE — Progress Notes (Signed)
OFFICE PROGRESS NOTE   Heather Bradford, MD [address] [place] [address] Dr. Chevis Pretty Dr. Chipper Herb  DIAGNOSIS: 67 year old female with invasive lobular breast cancer, stage IIB, receiving adjuvant chemotherapy  PRIOR THERAPY:  #1 patient was originally seen in the multidisciplinary breast clinic on 03/14/2012 for new diagnosis of invasive ductal carcinoma that was HER-2 positive node-positive, ER positive PR negative with Ki-67 of 44%  #2 on MRI patient also had an anterior breast mass that was to be biopsied to decide whether or not she should receive neoadjuvant chemotherapy or adjuvant chemotherapy. Patient'[name] mass was biopsied and it was positive for a malignancy that was HER-2/neu negative.  #3 patient is now status post right mastectomy with axillary lymph node dissection with the final pathology revealing the largest tumor measuring 5 cm, 1.7 cm and 1.6 cm. Tumor was ER positive PR negative HER-2/neu positive with a Ki-67 of 44% 3 of 7 lymph nodes were positive for metastatic disease.  #4 patient will proceed with adjuvant chemotherapy and Herceptin. Chemotherapy will consist of Taxotere carboplatinum given every 21 days with weekly Herceptin. Total of 6 cycles of Taxotere carboplatin will be administered starting on 06/14/2012. She began adjuvant every 3 week herceptin on 5/29.  Because patient'[name] tumor is ER positive she will also be given antiestrogen therapy adjuvantly.  #5 Radiation therapy beginning 10/25/12, finished on 12/11/12.    CURRENT THERAPY: Every 3 week Herceptin   INTERVAL HISTORY: [name] is here for f/u prior to her adjuvant Herceptin.  She is mildly fatigued.  She did have a cough and cold that lasted for 3 days on August 31.  It has since resolved.  She denies chest pain, palpitations, DOE, orthopnea, fevers, chills, nausea, vomiting, constipation, diarrhea.  The numbness in the fingertips is improving.  The numbness in the feet is stable.  She  continues to take super b complex daily.  Her increased lacrimation has improved also.  She does have urinary incontinence and has been undergoing pelvic physical therapy and is doing well with this.  Otherwise she is well and without complaints.    MEDICAL HISTORY: Past Medical History  Diagnosis Date  . Chronic kidney disease   . Hx: UTI (urinary tract infection)   . Right shoulder pain   . Right knee pain   . Hypertension     recently started Aldactone   . Hyperlipidemia     but not on meds;diet and exercise controlled  . Dizziness     has been going on for 7months;medical MD aware  . H/O hiatal hernia   . GERD (gastroesophageal reflux disease)     Tums prn  . Hemorrhoids   . Urinary frequency     d/t taking Aldactone  . History of UTI   . Anemia     hx of  . Cataract     immature-not sure of which eye  . Anxiety     r/t updated surgery  . Insomnia     takes Melatoniin daily  . Complication of anesthesia     pt states she is sensitive to meds  . Breast cancer     right  . Skin cancer   . Hx of radiation therapy 10/25/12- 12/11/12    right chest wall/regional lymph nodes 5040 cGy, 28 sessions, right chest wall boost 1000 cGy 1 session    ALLERGIES:  is allergic to codeine; keflex; naprosyn; and tussin.  MEDICATIONS:  Current Outpatient Prescriptions  Medication Sig Dispense Refill  .  B Complex-C (SUPER B COMPLEX PO) Take 1 each by mouth daily.      . calcium carbonate (TUMS - DOSED IN MG ELEMENTAL CALCIUM) 500 MG chewable tablet Chew 1 tablet by mouth daily.      . Cholecalciferol (VITAMIN D-3 PO) Take 4,000 Units by mouth daily.      Marland Kitchen emollient (BIAFINE) cream Apply 1 application topically as needed.      . hyaluronate sodium (RADIAPLEXRX) GEL Apply 1 application topically 2 (two) times daily. 2nd tube 11/19/12      . lidocaine-prilocaine (EMLA) cream Apply topically as needed.  30 g  7  . magnesium gluconate (MAGONATE) 500 MG tablet Take 500 mg by mouth 2 (two)  times daily.      . Probiotic Product (PROBIOTIC DAILY PO) Take 1 each by mouth.      . spironolactone (ALDACTONE) 25 MG tablet TAKE ONE-HALF TABLET (12.5MG ) BY MOUTH EVERY DAY  30 tablet  3  . clindamycin (CLINDAGEL) 1 % gel Apply topically 2 (two) times daily.  30 g  0  . furosemide (LASIX) 20 MG tablet Take 1 tablet (20 mg total) by mouth daily as needed.  30 tablet  3  . Olopatadine HCl 0.2 % SOLN Apply 1 drop to eye 2 (two) times daily.  2.5 mL  0  . omeprazole (PRILOSEC) 40 MG capsule Take 1 capsule (40 mg total) by mouth daily.  30 capsule  6  . potassium chloride SA (K-DUR,KLOR-CON) 20 MEQ tablet Take 1 tablet (20 mEq total) by mouth daily as needed. When you take Furosemide  90 tablet  3  . PREVIDENT 5000 BOOSTER PLUS 1.1 % PSTE Take 1 application by mouth Twice daily.       No current facility-administered medications for this visit.    SURGICAL HISTORY:  Past Surgical History  Procedure Laterality Date  . Cyst removed from left breast  1970  . Left wrist surgery   2004    with plate  . Colonoscopy    . Esophagogastroduodenoscopy    . Mastectomy, radical    . Mastectomy modified radical  05/10/2012    Procedure: MASTECTOMY MODIFIED RADICAL;  Surgeon: Robyne Askew, MD;  Location: [facility] OR;  Service: General;  Laterality: Right;  RIGHT MODIFIED RADICAL MASTECTOMY  . Portacath placement  05/10/2012    Procedure: INSERTION PORT-A-CATH;  Surgeon: Robyne Askew, MD;  Location: [facility] OR;  Service: General;  Laterality: Left;    REVIEW OF SYSTEMS:   A 10 point review of systems was conducted and is otherwise negative except for what is noted above.    PHYSICAL EXAMINATION: Blood pressure 137/85, pulse 76, temperature 97.5 F (36.4 C), temperature source Oral, resp. rate 18, height 5\' 4"  (1.626 m), weight 139 lb 9.6 oz (63.322 kg). Body mass index is 23.95 kg/(m^2). General: Patient is a well appearing female in no acute distress HEENT: PERRLA, sclerae anicteric no conjunctival pallor,  MMM Neck: supple, no palpable adenopathy Lungs: clear to auscultation bilaterally, no wheezes, rhonchi, or rales Cardiovascular: regular rate rhythm, S1, S2, no murmurs, rubs or gallops Abdomen: Soft, non-tender, non-distended, normoactive bowel sounds, no HSM Extremities: warm and well perfused, no clubbing, cyanosis, or edema Skin: No rashes or lesions Neuro: Non-focal Breasts: The right breast is status post mastectomy. There is no evidence of local recurrence. The right axilla is benign.  Erythema resolved to right chest wall. The left breast is unremarkable. ECOG PERFORMANCE STATUS: 1   LABORATORY DATA: Lab  Results  Component Value Date   WBC 4.1 01/17/2013   HGB 13.0 01/17/2013   HCT 38.8 01/17/2013   MCV 86.6 01/17/2013   PLT 187 01/17/2013      Chemistry      Component Value Date/Time   NA 139 01/17/2013 0914   NA 138 05/11/2012 0640   K 3.2* 01/17/2013 0914   K 4.7 05/11/2012 0640   CL 101 10/25/2012 0946   CL 99 05/11/2012 0640   CO2 23 01/17/2013 0914   CO2 20 05/11/2012 0640   BUN 12.2 01/17/2013 0914   BUN 12 05/11/2012 0640   CREATININE 0.7 01/17/2013 0914   CREATININE 0.88 05/11/2012 0640      Component Value Date/Time   CALCIUM 7.5* 01/17/2013 0914   CALCIUM 9.7 05/11/2012 0640   ALKPHOS 56 01/17/2013 0914   AST 16 01/17/2013 0914   ALT 12 01/17/2013 0914   BILITOT 0.38 01/17/2013 0914     ADDITIONAL INFORMATION: CHROMOGENIC IN-SITU HYBRIDIZATION (1C - TUMOR WITH CLIP) Interpretation HER-2/NEU BY CISH - NO AMPLIFICATION OF HER-2 DETECTED. THE RATIO OF HER-2: CEP 17 SIGNALS WAS 1.25. Reference range: Ratio: HER2:CEP17 < 1.8 - gene amplification not observed Ratio: HER2:CEP 17 1.8-2.2 - equivocal result Ratio: HER2:CEP17 > 2.2 - gene amplification observed CHROMOGENIC IN-SITU HYBRIDIZATION (1E - TUMOR WITHOUT CLIP) Interpretation: HER2/NEU BY CISH - SHOWS AMPLIFICATION BY CISH ANALYSIS. THE RATIO OF HER2: CEP 17 SIGNALS WAS 2.83. Reference range: Ratio: HER2:CEP17 < 1.8  gene amplification not observed Ratio: HER2:CEP 17 1.8-2.2 - equivocal result Ratio: HER2:CEP17 > 2.2 - gene amplification observed Pecola Leisure MD Pathologist, Electronic Signature ( Signed 05/15/2012) FINAL DIAGNOSIS Diagnosis Breast, modified radical mastectomy , right, with axillary contents 1 of 4 FINAL for Heather [name], Heather [name] ([id number]) Diagnosis(continued) TUMOR #1 (WITH CLIP): - INVASIVE LOBULAR CARCINOMA, GRADE II, SEE COMMENT. - LYMPHOVASCULAR INVASION IDENTIFIED. - PREVIOUS BIOPSY SITE IDENTIFIED. TUMOR #2 (NO CLIP): - INVASIVE LOBULAR CARCINOMA, GRADE II, SEE COMMENT. - LOBULAR CARCINOMA IN SITU. - LYMPHOVASCULAR INVASION IDENTIFIED. - THREE LYMPH NODES, POSITIVE FOR METASTATIC MAMMARY CARCINOMA (3/7). - TUMOR DEPOSITS ARE 1.7, 1.6, AND 5 CM. - EXTRACAPSULAR TUMOR EXTENSION IDENTIFIED. - TWO LYMPH NODES, POSITIVE FOR ISOLATED TUMOR CELLS. Microscopic Comment BREAST, INVASIVE TUMOR, WITH LYMPH NODE SAMPLING Specimen, including laterality: Right breast Procedure: Modified radical mastectomy Grade: Tumor 1 and tumor 2: Grade II of III Tubule formation: 3 Nuclear pleomorphism: 2 Mitotic: 1 Tumor size (gross measurement: Tumor #1- 2.3 cm; tumor #2 - 2.4 cm Margins: Invasive, distance to closest margin: Tumor #1 2.7 cm - deep margin; tumor #2 - 1.6 cm - deep margin Lymphovascular invasion: Present Ductal carcinoma in situ: Absent Grade: N/A Extensive intraductal component: N/A Lobular neoplasia: Both atypical hyperplasia and in situ carcinoma Extent of tumor: Skin: Grossly negative Nipple: Grossly negative Skeletal muscle: Microscopically negative Lymph nodes: # examined: 7 Lymph nodes with metastasis: 3 Isolated tumor cells (< 0.2 mm): two lymph nodes (Slide 1P and 1O) Micrometastasis: (> 0.2 mm and < 2.0 mm): none Macrometastasis: (> 2.0 mm): 1.7 cm , 1.6 cm and 5 cm Extracapsular extension: Present Breast prognostic profile: Tumor #1: Estrogen receptor: Not  repeated, previous study demonstrated 98% positivity ([id number]) Progesterone receptor: Not repeated, previous study demonstrated 0% positivity ([id number]) Her 2 neu: Repeated, previous study demonstrated no amplification (1.38) ([id number]) Ki-67: Not repeated, previous study demonstrated 5% proliferation rate ([id number]) Tumor #2: Estrogen receptor: Not repeated, previous study demonstrated 97% positivity ([id number]) Progesterone receptor: Not repeated, previous study demonstrated 0% positivity ([id number])  Her 2 neu: Repeated, previous study demonstrated amplification (3.65) ([id number]) Ki-67: Not repeated, previous study demonstrated 44% proliferation rate ([id number]) 2 of 4 FINAL for PHOEBIE, SHAD (SZA14-4) Microscopic Comment(continued) Non-neoplastic breast: Benign fibrocystic change with usual ductal hyperplasia, sclerosing adenosis, benign radial sclerosing lesion, and microcalcifications in benign ducts and lobules. TNM: mpT2, pN1a, pMX. Comments: none  RADIOGRAPHIC STUDIES:  Mr Lodema Pilot Contrast  03/23/2012  *RADIOLOGY REPORT*  Clinical Data: New diagnosis breast cancer.  Rule out metastatic disease.  MRI HEAD WITHOUT AND WITH CONTRAST  Technique:  Multiplanar, multiecho pulse sequences of the brain and surrounding structures were obtained according to standard protocol without and with intravenous contrast  Contrast: 12mL MULTIHANCE GADOBENATE DIMEGLUMINE 529 MG/ML IV SOLN  Comparison: None.  Findings: Ventricle size is normal.  Small 4 mm hyperintensities in the cerebral white matter bilaterally do not enhance and are most likely due to chronic microvascular ischemia.  Negative for acute infarct.  Diffusion weighted imaging is normal.  Pituitary is normal in size.  Negative for intracranial hemorrhage.  Following contrast infusion, no enhancing lesions are identified.  Paranasal sinuses are clear.  Vessels at the base of the brain are patent.  IMPRESSION: Negative  for metastatic disease.  No acute abnormality.   Original Report Authenticated By: Janeece Riggers, M.D.    Mr Breast Bilateral W Wo Contrast  03/12/2012  *RADIOLOGY REPORT*  Clinical Data: Recently diagnosed invasive mammary carcinoma in the 10 o'clock region of the right breast with a metastatic right axillary lymph node.  BILATERAL BREAST MRI WITH AND WITHOUT CONTRAST  Technique: Multiplanar, multisequence MR images of both breasts were obtained prior to and following the intravenous administration of 12ml of multihance.  Three dimensional images were evaluated at the independent DynaCad workstation.  Comparison:  Mammograms dated 03/05/2012, 03/01/2012, 03/01/2011 and 02/26/2010 from [name].  Findings: There is a moderate background parenchymal enhancement pattern.  Right breast: 1.  There is a lobulated 2.2 x 1.7 x 1.8 cm mass in the posterior third of the upper outer quadrant of the right breast.  There is a post biopsy clip artifact 1 cm lateral to the mass. 2.  In the anterior third upper outer quadrant of the right breast there is patchy non mass like enhancement measuring 0.8 x 0.3 x 0.3 cm.  Left breast:  There is no abnormal enhancement the left breast.  There are enlarged right axillary lymph nodes.  The largest right axillary lymph node measures 2.4 cm and is located lateral to the pectoralis minor consistent with level I adenopathy.  There is a 1.6 cm lymph node behind the pectoralis minor consistent with enlarged level II adenopathy.  IMPRESSION:  1.  2.2 cm mass in the posterior third of the upper outer quadrant of the right breast corresponding with the known invasive mammary carcinoma. 2.  Patchy non mass-like enhancement in the anterior third of the upper outer quadrant of the right breast.  MR guided core biopsy is recommended. 3. Enlarged level I and II right axillary adenopathy.  RECOMMENDATION: MR guided core biopsy of the right breast.  THREE-DIMENSIONAL MR IMAGE RENDERING ON  INDEPENDENT WORKSTATION:  Three-dimensional MR images were rendered by post-processing of the original MR data on an independent workstation.  The three- dimensional MR images were interpreted, and findings were reported in the accompanying complete MRI report for this study.  BI-RADS CATEGORY 4:  Suspicious abnormality - biopsy should be considered.   Original Report Authenticated By: Baird Lyons, M.D.  Mr Biopsy/wire Localization  03/20/2012  *RADIOLOGY REPORT*  Clinical Data:  Known right breast carcinoma.  Additional area of worrisome enhancement located within the anterior upper outer quadrant of the right breast.  MRI GUIDED VACUUM ASSISTED BIOPSY OF THE RIGHT BREAST WITHOUT AND WITH CONTRAST  Comparison: Previous exams.  Technique: Multiplanar, multisequence MR images of the right breast were obtained prior to and following the intravenous administration of 12 ml of Mulithance.  I met with the patient, and we discussed the procedure of MRI guided biopsy, including risks, benefits, and alternatives. Specifically, we discussed the risks of infection, bleeding, tissue injury, clip migration, and inadequate sampling.  Informed, written consent was given.  Using sterile technique, 2% Lidocaine, MRI guidance, and a 9 gauge vacuum assisted device, biopsy was performed of the area of enhancement located within the anterior portion of the upper-outer quadrant - right breast using a lateral approach.  At the conclusion of the procedure, a  tissue marker clip was deployed into the biopsy cavity.  IMPRESSION: MRI guided biopsy of the area of enhancement located within the anterior portion of the upper-outer quadrant of the right breast as discussed above. No apparent complications.  BI-RADS CATEGORY 6:  Known biopsy-proven malignancy - appropriate action should be taken.  THREE-DIMENSIONAL MR IMAGE RENDERING ON INDEPENDENT WORKSTATION:  Three-dimensional MR images were rendered by post-processing of the original MR  data on an independent workstation.  The three- dimensional MR images were interpreted, and findings were reported in the accompanying complete MRI report for this study.  The pathology associated with the right breast MR guided core biopsy demonstrated invasive mammary carcinoma and in situ mammary carcinoma.  This is concordant with imaging findings.  I have discussed findings with the patient by telephone and answered her questions.  The patient states that biopsy site is clean and dry without hematoma formation or signs of infection.  Post biopsy wound care instructions were reviewed with the patient.  Follow-up will be with Dr. Carolynne Edouard and Dr. Welton Flakes regards her treatment plan.  The patient was encouraged to call the Breast Center for additional questions or concerns   Original Report Authenticated By: Rolla Plate, M.D.    Nm Pet Image Initial (pi) Skull Base To Thigh  03/22/2012  *RADIOLOGY REPORT*  Clinical Data: Initial treatment strategy for high risk right breast cancer.  NUCLEAR MEDICINE PET SKULL BASE TO THIGH  Fasting Blood Glucose:  100  Technique:  18.6 mCi F-18 FDG was injected intravenously. CT data was obtained and used for attenuation correction and anatomic localization only.  (This was not acquired as a diagnostic CT examination.) Additional exam technical data entered on technologist worksheet.  Comparison:  MRI breast dated 03/12/2012  Findings:  Neck: No hypermetabolic lymph nodes in the neck.  Chest:  Vague hypermetabolism in the upper/central right breast, max SUV 2.7.  Additional focal hypermetabolism in the upper/outer right breast, max SUV 2.3 (PET image 105).  These findings correspond to biopsy-proven primary breast carcinoma.  1.3 cm short axis right subpectoral lymph node (series 2/image 66), max SUV 3.1.  Right axillary lymphadenopathy measuring up to 1.6 cm short axis (series 2/image 88), max SUV 4.8).  No hypermetabolic mediastinal or hilar nodes.  No suspicious pulmonary  nodules on the CT scan.  Abdomen/Pelvis:  No abnormal hypermetabolic activity within the liver, pancreas, adrenal glands, or spleen.  No hypermetabolic lymph nodes in the abdomen or pelvis.  Skeleton:  No focal hypermetabolic activity to suggest skeletal metastasis.  IMPRESSION: Two foci  of hypermetabolism in the upper right breast, max SUV 2.7, corresponding to biopsy-proven primary breast carcinoma.  Associated right subpectoral and axillary nodal metastases, max SUV 4.8.  No evidence of distant metastases.   Original Report Authenticated By: Charline Bills, M.D.    Mm Digital Diagnostic Unilat R  03/20/2012  *RADIOLOGY REPORT*  Clinical Data:  Post right breast MR guided core biopsy.  DIGITAL DIAGNOSTIC RIGHT BREAST MAMMOGRAM  Comparison:  03/05/2012, 03/01/2012, 03/01/2011, 02/26/2010 mammograms from [facility] imaging.  Findings:  Films are performed following MR guided biopsy of the anterior portion of the upper-outer quadrant of the right breast. The hourglass shaped clip placed during the MR guided core biopsy appears in appropriate position.  IMPRESSION: Appropriate positioning of clip following right breast MR guided core biopsy.   Original Report Authenticated By: Rolla Plate, M.D.     ASSESSMENT: 67 year old [place], [place] woman  #1  status post mastectomy with right axillary lymph node dissection 05/10/2012 for an mpT2 N1, stage stage IIB invasive lobular breast cancer, stage IIB, ER positive, HER-2/neu positive, with an Mib-1 between 5% and 44%  #2 Patient underwent adjuvant chemotherapy consisting of Taxotere, carboplatinum and Herceptin given every 21 days for total of 6 cycles of Taxotere carboplatinum and weekly Herceptin beginning on 06/14/12.  Adjuvant every three week herceptin began on 10/04/12, and patient received radiation therapy from on 10/25/12 through 12/11/12.      #3 Herceptin to be continued to total one year; most recent echo 11/23/12.  PLAN:   1. Ms. Boutelle is doing well. She  will proceed with Herceptin.   We discussed anti-estrogen therapy and her tumor.  She and I discussed major side effects, risks, benefits.  We do not have record of her bone density from [place] on 01/02/13.  She would like the results of this and to discuss anti-estrogen therapy further at her next appointment.    2. She will continue to take Super B complex daily. She declined Neurontin therapy.  Her numbness is improved today.    3. Ms. Butchko will return in three weeks for her next Herceptin therapy.    4. Ms. Henery is doing well with Lasix PRN.  She will return to Dr. Gala Romney in October.   All questions were answered. The patient knows to call the clinic with any problems, questions or concerns. We can certainly see the patient much sooner if necessary.   I spent 25 minutes counseling the patient face to face. The total time spent in the appointment was 30 minutes.  Cherie Ouch Lyn Hollingshead, NP Medical Oncology [facility] Phone: [phone] 01/19/2013, 8:48 AM

## 2013-01-18 LAB — LIPID PANEL
Cholesterol: 190 mg/dL (ref 0–200)
HDL: 39 mg/dL — ABNORMAL LOW (ref >39)
LDL Cholesterol: 105 mg/dL — ABNORMAL HIGH (ref 0–99)
Total CHOL/HDL Ratio: 4.9 Ratio
Triglycerides: 228 mg/dL — ABNORMAL HIGH (ref <150)
VLDL: 46 mg/dL — ABNORMAL HIGH (ref 0–40)

## 2013-01-18 LAB — VITAMIN D 25 HYDROXY (VIT D DEFICIENCY, FRACTURES): Vit D, 25-Hydroxy: 66 ng/mL (ref 30–89)

## 2013-01-22 ENCOUNTER — Ambulatory Visit
Admission: RE | Admit: 2013-01-22 | Discharge: 2013-01-22 | Disposition: A | Discharge status: Home / Self Care | Payer: Medicare Other | Source: Ambulatory Visit | Attending: Radiation Oncology | Admitting: Radiation Oncology

## 2013-01-22 ENCOUNTER — Encounter: Payer: Self-pay | Admitting: Radiation Oncology

## 2013-01-22 VITALS — BP 136/85 | HR 71 | Temp 98.0°F | Resp 20 | Wt 140.4 lb

## 2013-01-22 DIAGNOSIS — C50411 Malignant neoplasm of upper-outer quadrant of right female breast: Secondary | ICD-10-CM

## 2013-01-22 NOTE — Progress Notes (Signed)
Followup note:  Heather [name] returns today approximately 5 weeks following completion of post mastectomy radiation therapy to her right chest wall and regional lymph nodes in the management of her T2 N1 invasive lobular carcinoma of the right breast. She still doing well in her fatigue is much improved. She has been going to the Heather [name] for exercise. She continues with her Herceptin. She expects to start letrozole later this year. She will see Dr. Welton Flakes in late October, she believes that she will see Dr. Carolynne Edouard in November. She wears a right upper extremity sleeve/glove when exercising.  Physical examination: Alert and oriented. Filed Vitals:   01/22/13 1101  BP: 136/85  Pulse: 71  Temp: 98 F (36.7 C)  Resp: 20   Head neck examination: She wears a wig. Nodes: Without palpable cervical, supraclavicular, or axillary lymphadenopathy. Chest: There is residual hyperpigmentation along the right anterior chest wall with patchy dry desquamation. No visible or palpable evidence for persistent/recurrent disease. Lungs clear. Left breast without masses or lesions. Abdomen without hepatomegaly. Extremities: Trace right upper extremity lymphedema.  Impression: Satisfactory progress.  Plan: She'll maintain her followup with Dr. Welton Flakes in October, and Dr. Carolynne Edouard, possibly November.

## 2013-01-22 NOTE — Progress Notes (Signed)
Pt denies pain, fatigue, loss of appetite. She is exercising at home and at [facility] once a week. She wears her compression sleeve and glove when she exercises at the [facility]. She states she has been released by PT. She states she has some slight swelling of right arm and hand but feels she occasionally has swelling in left hand as well. Pt on Herceptin therapy.

## 2013-01-29 ENCOUNTER — Ambulatory Visit: Payer: Medicare Other | Admitting: Physical Therapy

## 2013-02-07 ENCOUNTER — Other Ambulatory Visit (HOSPITAL_BASED_OUTPATIENT_CLINIC_OR_DEPARTMENT_OTHER): Payer: Medicare Other | Admitting: Lab

## 2013-02-07 ENCOUNTER — Ambulatory Visit (HOSPITAL_BASED_OUTPATIENT_CLINIC_OR_DEPARTMENT_OTHER): Payer: Medicare Other

## 2013-02-07 ENCOUNTER — Ambulatory Visit (HOSPITAL_BASED_OUTPATIENT_CLINIC_OR_DEPARTMENT_OTHER): Payer: Medicare Other | Admitting: Adult Health

## 2013-02-07 ENCOUNTER — Encounter: Payer: Self-pay | Admitting: Adult Health

## 2013-02-07 ENCOUNTER — Other Ambulatory Visit (INDEPENDENT_AMBULATORY_CARE_PROVIDER_SITE_OTHER): Payer: Self-pay | Admitting: *Deleted

## 2013-02-07 VITALS — BP 151/78 | HR 74 | Temp 98.0°F | Resp 20 | Ht 64.0 in | Wt 141.0 lb

## 2013-02-07 DIAGNOSIS — Z170 Estrogen receptor positive status [ER+]: Secondary | ICD-10-CM

## 2013-02-07 DIAGNOSIS — Z5112 Encounter for antineoplastic immunotherapy: Secondary | ICD-10-CM

## 2013-02-07 DIAGNOSIS — C50412 Malignant neoplasm of upper-outer quadrant of left female breast: Secondary | ICD-10-CM

## 2013-02-07 DIAGNOSIS — C50411 Malignant neoplasm of upper-outer quadrant of right female breast: Secondary | ICD-10-CM

## 2013-02-07 DIAGNOSIS — C50419 Malignant neoplasm of upper-outer quadrant of unspecified female breast: Secondary | ICD-10-CM

## 2013-02-07 DIAGNOSIS — Z23 Encounter for immunization: Secondary | ICD-10-CM

## 2013-02-07 DIAGNOSIS — C50919 Malignant neoplasm of unspecified site of unspecified female breast: Secondary | ICD-10-CM

## 2013-02-07 LAB — COMPREHENSIVE METABOLIC PANEL (CC13)
ALT: 15 U/L (ref 0–55)
AST: 22 U/L (ref 5–34)
Albumin: 3.8 g/dL (ref 3.5–5.0)
Alkaline Phosphatase: 83 U/L (ref 40–150)
BUN: 14.3 mg/dL (ref 7.0–26.0)
CO2: 25 mEq/L (ref 22–29)
Calcium: 9.4 mg/dL (ref 8.4–10.4)
Chloride: 100 mEq/L (ref 98–109)
Creatinine: 1.1 mg/dL (ref 0.6–1.1)
Glucose: 98 mg/dl (ref 70–140)
Potassium: 4.1 mEq/L (ref 3.5–5.1)
Sodium: 136 mEq/L (ref 136–145)
Total Bilirubin: 0.6 mg/dL (ref 0.20–1.20)
Total Protein: 7.3 g/dL (ref 6.4–8.3)

## 2013-02-07 LAB — CBC WITH DIFFERENTIAL/PLATELET
BASO%: 0.5 % (ref 0.0–2.0)
Basophils Absolute: 0 10*3/uL (ref 0.0–0.1)
EOS%: 1.7 % (ref 0.0–7.0)
Eosinophils Absolute: 0.1 10*3/uL (ref 0.0–0.5)
HCT: 39.2 % (ref 34.8–46.6)
HGB: 13.2 g/dL (ref 11.6–15.9)
LYMPH%: 20 % (ref 14.0–49.7)
MCH: 29 pg (ref 25.1–34.0)
MCHC: 33.7 g/dL (ref 31.5–36.0)
MCV: 86.2 fL (ref 79.5–101.0)
MONO#: 0.4 10*3/uL (ref 0.1–0.9)
MONO%: 8.8 % (ref 0.0–14.0)
NEUT#: 2.9 10*3/uL (ref 1.5–6.5)
NEUT%: 69 % (ref 38.4–76.8)
Platelets: 146 10*3/uL (ref 145–400)
RBC: 4.55 10*6/uL (ref 3.70–5.45)
RDW: 13.6 % (ref 11.2–14.5)
WBC: 4.2 10*3/uL (ref 3.9–10.3)
lymph#: 0.8 10*3/uL — ABNORMAL LOW (ref 0.9–3.3)

## 2013-02-07 MED ORDER — TRASTUZUMAB CHEMO INJECTION 440 MG
6.0000 mg/kg | Freq: Once | INTRAVENOUS | Status: AC
  Administered 2013-02-07: 378 mg via INTRAVENOUS
  Filled 2013-02-07: qty 18

## 2013-02-07 MED ORDER — SODIUM CHLORIDE 0.9 % IV SOLN
Freq: Once | INTRAVENOUS | Status: AC
  Administered 2013-02-07: 14:00:00 via INTRAVENOUS

## 2013-02-07 MED ORDER — LETROZOLE 2.5 MG PO TABS: 2.5000 mg | ORAL_TABLET | Freq: Every day | ORAL | Status: DC

## 2013-02-07 MED ORDER — ACETAMINOPHEN 325 MG PO TABS
ORAL_TABLET | ORAL | Status: AC
  Filled 2013-02-07: qty 2

## 2013-02-07 MED ORDER — INFLUENZA VAC SPLIT QUAD 0.5 ML IM SUSP
0.5000 mL | INTRAMUSCULAR | Status: AC
  Administered 2013-02-07: 0.5 mL via INTRAMUSCULAR
  Filled 2013-02-07: qty 0.5

## 2013-02-07 MED ORDER — DIPHENHYDRAMINE HCL 25 MG PO CAPS
ORAL_CAPSULE | ORAL | Status: AC
  Filled 2013-02-07: qty 1

## 2013-02-07 MED ORDER — DIPHENHYDRAMINE HCL 25 MG PO CAPS
25.0000 mg | ORAL_CAPSULE | Freq: Once | ORAL | Status: AC
  Administered 2013-02-07: 25 mg via ORAL

## 2013-02-07 MED ORDER — UNABLE TO FIND: Status: DC

## 2013-02-07 MED ORDER — SODIUM CHLORIDE 0.9 % IJ SOLN
10.0000 mL | INTRAMUSCULAR | Status: DC | PRN
  Administered 2013-02-07: 10 mL
  Filled 2013-02-07: qty 10

## 2013-02-07 MED ORDER — ACETAMINOPHEN 325 MG PO TABS
650.0000 mg | ORAL_TABLET | Freq: Once | ORAL | Status: AC
  Administered 2013-02-07: 650 mg via ORAL

## 2013-02-07 MED ORDER — HEPARIN SOD (PORK) LOCK FLUSH 100 UNIT/ML IV SOLN
500.0000 [IU] | Freq: Once | INTRAVENOUS | Status: AC | PRN
  Administered 2013-02-07: 500 [IU]
  Filled 2013-02-07: qty 5

## 2013-02-07 NOTE — Progress Notes (Addendum)
OFFICE PROGRESS NOTE   Heather Bradford, MD [address], [address] [place] Heather [name] [address] Dr. Chevis Pretty Dr. Chipper Herb  DIAGNOSIS: 67 year old female with invasive lobular breast cancer, stage IIB, receiving adjuvant chemotherapy  PRIOR THERAPY:  #1 patient was originally seen in the multidisciplinary breast clinic on 03/14/2012 for new diagnosis of invasive ductal carcinoma that was HER-2 positive node-positive, ER positive PR negative with Ki-67 of 44%  #2 on MRI patient also had an anterior breast mass that was to be biopsied to decide whether or not she should receive neoadjuvant chemotherapy or adjuvant chemotherapy. Patient'[name] mass was biopsied and it was positive for a malignancy that was HER-2/neu negative.  #3 patient is now status post right mastectomy with axillary lymph node dissection with the final pathology revealing the largest tumor measuring 5 cm, 1.7 cm and 1.6 cm. Tumor was ER positive PR negative HER-2/neu positive with a Ki-67 of 44% 3 of 7 lymph nodes were positive for metastatic disease.  #4 patient will proceed with adjuvant chemotherapy and Herceptin. Chemotherapy will consist of Taxotere carboplatinum given every 21 days with weekly Herceptin. Total of 6 cycles of Taxotere carboplatin will be administered starting on 06/14/2012. She began adjuvant every 3 week herceptin on 5/29.  Because patient'[name] tumor is ER positive she will also be given antiestrogen therapy adjuvantly.  #5 Radiation therapy beginning 10/25/12, finished on 12/11/12.    CURRENT THERAPY: Every 3 week Herceptin   INTERVAL HISTORY: Heather [name] is here for f/u prior to her adjuvant Herceptin.  She is doing well today.  She continues to go to PT for her urge incontinence with Eulis Foster, she has noticed an improvement.  She wants more information about her bone density and anti-estrogen therapy.  Otherwise she denies fevers, chills, nausea, vomiting, constipation, chest pain, palpitations,  orthopnea, swelling or any further concerns.    MEDICAL HISTORY: Past Medical History  Diagnosis Date  . Chronic kidney disease   . Hx: UTI (urinary tract infection)   . Right shoulder pain   . Right knee pain   . Hypertension     recently started Aldactone   . Hyperlipidemia     but not on meds;diet and exercise controlled  . Dizziness     has been going on for 55months;medical MD aware  . H/O hiatal hernia   . GERD (gastroesophageal reflux disease)     Tums prn  . Hemorrhoids   . Urinary frequency     d/t taking Aldactone  . History of UTI   . Anemia     hx of  . Cataract     immature-not sure of which eye  . Anxiety     r/t updated surgery  . Insomnia     takes Melatoniin daily  . Complication of anesthesia     pt states she is sensitive to meds  . Breast cancer     right  . Skin cancer   . Hx of radiation therapy 10/25/12- 12/11/12    right chest wall/regional lymph nodes 5040 cGy, 28 sessions, right chest wall boost 1000 cGy 1 session    ALLERGIES:  is allergic to codeine; keflex; naprosyn; and tussin.  MEDICATIONS:  Current Outpatient Prescriptions  Medication Sig Dispense Refill  . B Complex-C (SUPER B COMPLEX PO) Take 1 each by mouth daily.      . calcium carbonate (TUMS - DOSED IN MG ELEMENTAL CALCIUM) 500 MG chewable tablet Chew 1 tablet by mouth daily.      Marland Kitchen  Cholecalciferol (VITAMIN D-3 PO) Take 4,000 Units by mouth daily.      . clindamycin (CLINDAGEL) 1 % gel Apply topically 2 (two) times daily.  30 g  0  . emollient (BIAFINE) cream Apply 1 application topically as needed.      . furosemide (LASIX) 20 MG tablet Take 1 tablet (20 mg total) by mouth daily as needed.  30 tablet  3  . hyaluronate sodium (RADIAPLEXRX) GEL Apply 1 application topically 2 (two) times daily. 2nd tube 11/19/12      . letrozole (FEMARA) 2.5 MG tablet Take 1 tablet (2.5 mg total) by mouth daily.  30 tablet  2  . lidocaine-prilocaine (EMLA) cream Apply topically as needed.  30 g  7   . magnesium gluconate (MAGONATE) 500 MG tablet Take 500 mg by mouth 2 (two) times daily.      . Olopatadine HCl 0.2 % SOLN Apply 1 drop to eye 2 (two) times daily.  2.5 mL  0  . omeprazole (PRILOSEC) 40 MG capsule Take 1 capsule (40 mg total) by mouth daily.  30 capsule  6  . potassium chloride SA (K-DUR,KLOR-CON) 20 MEQ tablet Take 1 tablet (20 mEq total) by mouth daily as needed. When you take Furosemide  90 tablet  3  . PREVIDENT 5000 BOOSTER PLUS 1.1 % PSTE Take 1 application by mouth Twice daily.      . Probiotic Product (PROBIOTIC DAILY PO) Take 1 each by mouth.      . spironolactone (ALDACTONE) 25 MG tablet TAKE ONE-HALF TABLET (12.5MG ) BY MOUTH EVERY DAY  30 tablet  3  . UNABLE TO FIND Rx: L8000- Post Surgical Bra (Quantity: 6) L8030- Silicone Breast Prosthesis (Quantity: 1) Dx: 174.9; Right mastectomy  1 each  0   No current facility-administered medications for this visit.    SURGICAL HISTORY:  Past Surgical History  Procedure Laterality Date  . Cyst removed from left breast  1970  . Left wrist surgery   2004    with plate  . Colonoscopy    . Esophagogastroduodenoscopy    . Mastectomy, radical    . Mastectomy modified radical  05/10/2012    Procedure: MASTECTOMY MODIFIED RADICAL;  Surgeon: Robyne Askew, MD;  Location: [facility] OR;  Service: General;  Laterality: Right;  RIGHT MODIFIED RADICAL MASTECTOMY  . Portacath placement  05/10/2012    Procedure: INSERTION PORT-A-CATH;  Surgeon: Robyne Askew, MD;  Location: [facility] OR;  Service: General;  Laterality: Left;    REVIEW OF SYSTEMS:   A 10 point review of systems was conducted and is otherwise negative except for what is noted above.    Health Maintenance  Mammogram: 02/2013 scheduled Colonoscopy: 2 years ago Bone Density Scan: 01/03/13 Pap Smear: 10/2011 Eye Exam: 02/12/2013 Vitamin D Level: 01/17/13 Lipid Panel:01/17/13   PHYSICAL EXAMINATION: Blood pressure 151/78, pulse 74, temperature 98 F (36.7 C), temperature source  Oral, resp. rate 20, height 5\' 4"  (1.626 m), weight 141 lb (63.957 kg). Body mass index is 24.19 kg/(m^2). General: Patient is a well appearing female in no acute distress HEENT: PERRLA, sclerae anicteric no conjunctival pallor, MMM Neck: supple, no palpable adenopathy Lungs: clear to auscultation bilaterally, no wheezes, rhonchi, or rales Cardiovascular: regular rate rhythm, S1, S2, no murmurs, rubs or gallops Abdomen: Soft, non-tender, non-distended, normoactive bowel sounds, no HSM Extremities: warm and well perfused, no clubbing, cyanosis, or edema Skin: No rashes or lesions Neuro: Non-focal Breasts: The right breast is status post mastectomy. There is  no evidence of local recurrence. The right axilla is benign.  Erythema resolved to right chest wall. The left breast is unremarkable. ECOG PERFORMANCE STATUS: 1   LABORATORY DATA: Lab Results  Component Value Date   WBC 4.2 02/07/2013   HGB 13.2 02/07/2013   HCT 39.2 02/07/2013   MCV 86.2 02/07/2013   PLT 146 02/07/2013      Chemistry      Component Value Date/Time   NA 136 02/07/2013 1253   NA 138 05/11/2012 0640   K 4.1 02/07/2013 1253   K 4.7 05/11/2012 0640   CL 101 10/25/2012 0946   CL 99 05/11/2012 0640   CO2 25 02/07/2013 1253   CO2 20 05/11/2012 0640   BUN 14.3 02/07/2013 1253   BUN 12 05/11/2012 0640   CREATININE 1.1 02/07/2013 1253   CREATININE 0.88 05/11/2012 0640      Component Value Date/Time   CALCIUM 9.4 02/07/2013 1253   CALCIUM 9.7 05/11/2012 0640   ALKPHOS 83 02/07/2013 1253   AST 22 02/07/2013 1253   ALT 15 02/07/2013 1253   BILITOT 0.60 02/07/2013 1253     ADDITIONAL INFORMATION: CHROMOGENIC IN-SITU HYBRIDIZATION (1C - TUMOR WITH CLIP) Interpretation HER-2/NEU BY CISH - NO AMPLIFICATION OF HER-2 DETECTED. THE RATIO OF HER-2: CEP 17 SIGNALS WAS 1.25. Reference range: Ratio: HER2:CEP17 < 1.8 - gene amplification not observed Ratio: HER2:CEP 17 1.8-2.2 - equivocal result Ratio: HER2:CEP17 > 2.2 - gene amplification  observed CHROMOGENIC IN-SITU HYBRIDIZATION (1E - TUMOR WITHOUT CLIP) Interpretation: HER2/NEU BY CISH - SHOWS AMPLIFICATION BY CISH ANALYSIS. THE RATIO OF HER2: CEP 17 SIGNALS WAS 2.83. Reference range: Ratio: HER2:CEP17 < 1.8 gene amplification not observed Ratio: HER2:CEP 17 1.8-2.2 - equivocal result Ratio: HER2:CEP17 > 2.2 - gene amplification observed Pecola Leisure MD Pathologist, Electronic Signature ( Signed 05/15/2012) FINAL DIAGNOSIS Diagnosis Breast, modified radical mastectomy , right, with axillary contents 1 of 4 FINAL for Heather [name], Heather [name] ([id number]) Diagnosis(continued) TUMOR #1 (WITH CLIP): - INVASIVE LOBULAR CARCINOMA, GRADE II, SEE COMMENT. - LYMPHOVASCULAR INVASION IDENTIFIED. - PREVIOUS BIOPSY SITE IDENTIFIED. TUMOR #2 (NO CLIP): - INVASIVE LOBULAR CARCINOMA, GRADE II, SEE COMMENT. - LOBULAR CARCINOMA IN SITU. - LYMPHOVASCULAR INVASION IDENTIFIED. - THREE LYMPH NODES, POSITIVE FOR METASTATIC MAMMARY CARCINOMA (3/7). - TUMOR DEPOSITS ARE 1.7, 1.6, AND 5 CM. - EXTRACAPSULAR TUMOR EXTENSION IDENTIFIED. - TWO LYMPH NODES, POSITIVE FOR ISOLATED TUMOR CELLS. Microscopic Comment BREAST, INVASIVE TUMOR, WITH LYMPH NODE SAMPLING Specimen, including laterality: Right breast Procedure: Modified radical mastectomy Grade: Tumor 1 and tumor 2: Grade II of III Tubule formation: 3 Nuclear pleomorphism: 2 Mitotic: 1 Tumor size (gross measurement: Tumor #1- 2.3 cm; tumor #2 - 2.4 cm Margins: Invasive, distance to closest margin: Tumor #1 2.7 cm - deep margin; tumor #2 - 1.6 cm - deep margin Lymphovascular invasion: Present Ductal carcinoma in situ: Absent Grade: N/A Extensive intraductal component: N/A Lobular neoplasia: Both atypical hyperplasia and in situ carcinoma Extent of tumor: Skin: Grossly negative Nipple: Grossly negative Skeletal muscle: Microscopically negative Lymph nodes: # examined: 7 Lymph nodes with metastasis: 3 Isolated tumor cells (< 0.2 mm): two  lymph nodes (Slide 1P and 1O) Micrometastasis: (> 0.2 mm and < 2.0 mm): none Macrometastasis: (> 2.0 mm): 1.7 cm , 1.6 cm and 5 cm Extracapsular extension: Present Breast prognostic profile: Tumor #1: Estrogen receptor: Not repeated, previous study demonstrated 98% positivity ([id number]) Progesterone receptor: Not repeated, previous study demonstrated 0% positivity ([id number]) Her 2 neu: Repeated, previous study demonstrated no amplification (1.38) ([id number]) Ki-67:  Not repeated, previous study demonstrated 5% proliferation rate ([id number]) Tumor #2: Estrogen receptor: Not repeated, previous study demonstrated 97% positivity ([id number]) Progesterone receptor: Not repeated, previous study demonstrated 0% positivity ([id number]) Her 2 neu: Repeated, previous study demonstrated amplification (3.65) ([id number]) Ki-67: Not repeated, previous study demonstrated 44% proliferation rate ([id number]) 2 of 4 FINAL for Heather [name], Heather [name] ([id number]) Microscopic Comment(continued) Non-neoplastic breast: Benign fibrocystic change with usual ductal hyperplasia, sclerosing adenosis, benign radial sclerosing lesion, and microcalcifications in benign ducts and lobules. TNM: mpT2, pN1a, pMX. Comments: none  RADIOGRAPHIC STUDIES:  Mr Lodema Pilot Contrast  03/23/2012  *RADIOLOGY REPORT*  Clinical Data: New diagnosis breast cancer.  Rule out metastatic disease.  MRI HEAD WITHOUT AND WITH CONTRAST  Technique:  Multiplanar, multiecho pulse sequences of the brain and surrounding structures were obtained according to standard protocol without and with intravenous contrast  Contrast: 12mL MULTIHANCE GADOBENATE DIMEGLUMINE 529 MG/ML IV SOLN  Comparison: None.  Findings: Ventricle size is normal.  Small 4 mm hyperintensities in the cerebral white matter bilaterally do not enhance and are most likely due to chronic microvascular ischemia.  Negative for acute infarct.  Diffusion weighted imaging is normal.   Pituitary is normal in size.  Negative for intracranial hemorrhage.  Following contrast infusion, no enhancing lesions are identified.  Paranasal sinuses are clear.  Vessels at the base of the brain are patent.  IMPRESSION: Negative for metastatic disease.  No acute abnormality.   Original Report Authenticated By: Janeece Riggers, M.D.    Mr Breast Bilateral W Wo Contrast  03/12/2012  *RADIOLOGY REPORT*  Clinical Data: Recently diagnosed invasive mammary carcinoma in the 10 o'clock region of the right breast with a metastatic right axillary lymph node.  BILATERAL BREAST MRI WITH AND WITHOUT CONTRAST  Technique: Multiplanar, multisequence MR images of both breasts were obtained prior to and following the intravenous administration of 12ml of multihance.  Three dimensional images were evaluated at the independent DynaCad workstation.  Comparison:  Mammograms dated 03/05/2012, 03/01/2012, 03/01/2011 and 02/26/2010 from [facility].  Findings: There is a moderate background parenchymal enhancement pattern.  Right breast: 1.  There is a lobulated 2.2 x 1.7 x 1.8 cm mass in the posterior third of the upper outer quadrant of the right breast.  There is a post biopsy clip artifact 1 cm lateral to the mass. 2.  In the anterior third upper outer quadrant of the right breast there is patchy non mass like enhancement measuring 0.8 x 0.3 x 0.3 cm.  Left breast:  There is no abnormal enhancement the left breast.  There are enlarged right axillary lymph nodes.  The largest right axillary lymph node measures 2.4 cm and is located lateral to the pectoralis minor consistent with level I adenopathy.  There is a 1.6 cm lymph node behind the pectoralis minor consistent with enlarged level II adenopathy.  IMPRESSION:  1.  2.2 cm mass in the posterior third of the upper outer quadrant of the right breast corresponding with the known invasive mammary carcinoma. 2.  Patchy non mass-like enhancement in the anterior third of the upper  outer quadrant of the right breast.  MR guided core biopsy is recommended. 3. Enlarged level I and II right axillary adenopathy.  RECOMMENDATION: MR guided core biopsy of the right breast.  THREE-DIMENSIONAL MR IMAGE RENDERING ON INDEPENDENT WORKSTATION:  Three-dimensional MR images were rendered by post-processing of the original MR data on an independent workstation.  The three- dimensional MR images were interpreted, and findings were reported  in the accompanying complete MRI report for this study.  BI-RADS CATEGORY 4:  Suspicious abnormality - biopsy should be considered.   Original Report Authenticated By: Baird Lyons, M.D.    Mr Biopsy/wire Localization  03/20/2012  *RADIOLOGY REPORT*  Clinical Data:  Known right breast carcinoma.  Additional area of worrisome enhancement located within the anterior upper outer quadrant of the right breast.  MRI GUIDED VACUUM ASSISTED BIOPSY OF THE RIGHT BREAST WITHOUT AND WITH CONTRAST  Comparison: Previous exams.  Technique: Multiplanar, multisequence MR images of the right breast were obtained prior to and following the intravenous administration of 12 ml of Mulithance.  I met with the patient, and we discussed the procedure of MRI guided biopsy, including risks, benefits, and alternatives. Specifically, we discussed the risks of infection, bleeding, tissue injury, clip migration, and inadequate sampling.  Informed, written consent was given.  Using sterile technique, 2% Lidocaine, MRI guidance, and a 9 gauge vacuum assisted device, biopsy was performed of the area of enhancement located within the anterior portion of the upper-outer quadrant - right breast using a lateral approach.  At the conclusion of the procedure, a  tissue marker clip was deployed into the biopsy cavity.  IMPRESSION: MRI guided biopsy of the area of enhancement located within the anterior portion of the upper-outer quadrant of the right breast as discussed above. No apparent complications.  BI-RADS  CATEGORY 6:  Known biopsy-proven malignancy - appropriate action should be taken.  THREE-DIMENSIONAL MR IMAGE RENDERING ON INDEPENDENT WORKSTATION:  Three-dimensional MR images were rendered by post-processing of the original MR data on an independent workstation.  The three- dimensional MR images were interpreted, and findings were reported in the accompanying complete MRI report for this study.  The pathology associated with the right breast MR guided core biopsy demonstrated invasive mammary carcinoma and in situ mammary carcinoma.  This is concordant with imaging findings.  I have discussed findings with the patient by telephone and answered her questions.  The patient states that biopsy site is clean and dry without hematoma formation or signs of infection.  Post biopsy wound care instructions were reviewed with the patient.  Follow-up will be with Dr. Carolynne Edouard and Dr. Welton Flakes regards her treatment plan.  The patient was encouraged to call the Breast Center for additional questions or concerns   Original Report Authenticated By: Rolla Plate, M.D.    Nm Pet Image Initial (pi) Skull Base To Thigh  03/22/2012  *RADIOLOGY REPORT*  Clinical Data: Initial treatment strategy for high risk right breast cancer.  NUCLEAR MEDICINE PET SKULL BASE TO THIGH  Fasting Blood Glucose:  100  Technique:  18.6 mCi F-18 FDG was injected intravenously. CT data was obtained and used for attenuation correction and anatomic localization only.  (This was not acquired as a diagnostic CT examination.) Additional exam technical data entered on technologist worksheet.  Comparison:  MRI breast dated 03/12/2012  Findings:  Neck: No hypermetabolic lymph nodes in the neck.  Chest:  Vague hypermetabolism in the upper/central right breast, max SUV 2.7.  Additional focal hypermetabolism in the upper/outer right breast, max SUV 2.3 (PET image 105).  These findings correspond to biopsy-proven primary breast carcinoma.  1.3 cm short axis right  subpectoral lymph node (series 2/image 66), max SUV 3.1.  Right axillary lymphadenopathy measuring up to 1.6 cm short axis (series 2/image 88), max SUV 4.8).  No hypermetabolic mediastinal or hilar nodes.  No suspicious pulmonary nodules on the CT scan.  Abdomen/Pelvis:  No abnormal hypermetabolic activity  within the liver, pancreas, adrenal glands, or spleen.  No hypermetabolic lymph nodes in the abdomen or pelvis.  Skeleton:  No focal hypermetabolic activity to suggest skeletal metastasis.  IMPRESSION: Two foci of hypermetabolism in the upper right breast, max SUV 2.7, corresponding to biopsy-proven primary breast carcinoma.  Associated right subpectoral and axillary nodal metastases, max SUV 4.8.  No evidence of distant metastases.   Original Report Authenticated By: Charline Bills, M.D.    Mm Digital Diagnostic Unilat R  03/20/2012  *RADIOLOGY REPORT*  Clinical Data:  Post right breast MR guided core biopsy.  DIGITAL DIAGNOSTIC RIGHT BREAST MAMMOGRAM  Comparison:  03/05/2012, 03/01/2012, 03/01/2011, 02/26/2010 mammograms from [facility] imaging.  Findings:  Films are performed following MR guided biopsy of the anterior portion of the upper-outer quadrant of the right breast. The hourglass shaped clip placed during the MR guided core biopsy appears in appropriate position.  IMPRESSION: Appropriate positioning of clip following right breast MR guided core biopsy.   Original Report Authenticated By: Rolla Plate, M.D.     ASSESSMENT: 66 year old Heather [name], Heather [name] woman  #1  status post mastectomy with right axillary lymph node dissection 05/10/2012 for an mpT2 N1, stage stage IIB invasive lobular breast cancer, stage IIB, ER positive, HER-2/neu positive, with an Mib-1 between 5% and 44%  #2 Patient underwent adjuvant chemotherapy consisting of Taxotere, carboplatinum and Herceptin given every 21 days for total of 6 cycles of Taxotere carboplatinum and weekly Herceptin beginning on 06/14/12.  Adjuvant every  three week herceptin began on 10/04/12, and patient received radiation therapy from on 10/25/12 through 12/11/12.      #3 Herceptin to be continued to total one year; most recent echo 11/23/12.  PLAN:   1. Ms. Hitch is doing well. She will proceed with Herceptin.   I have given her a detailed list of anti-estrogen oral therapy and side effects.  She will review these prior to making any insurance changes, and start Letrozole 2.5 mg daily.    2. She will continue to take Super B complex daily. She declined Neurontin therapy.    3. Ms. Casa will return in three weeks for her next Herceptin therapy.    4. Ms. Garbers is doing well with Lasix PRN.  She will return to Dr. Gala Romney on 03/05/13.   All questions were answered. The patient knows to call the clinic with any problems, questions or concerns. We can certainly see the patient much sooner if necessary.   I spent 25 minutes counseling the patient face to face. The total time spent in the appointment was 30 minutes.  Cherie Ouch Lyn Hollingshead, NP Medical Oncology [facility] Phone: [phone] 02/08/2013, 10:44 AM    ATTENDING'[name] ATTESTATION:  I personally reviewed patient'[name] chart, examined patient myself, formulated the treatment plan as followed.    Overall he is doing well she'[name] tolerating Herceptin very nicely. I have reassured the patient that she is on schedule with everything. He is doing exactly as we could do. Her overall prognosis is good.  Drue Second, MD Medical/Oncology [facility] [phone] (beeper) [phone] (Office)

## 2013-02-07 NOTE — Patient Instructions (Signed)
[facility] Discharge Instructions for Patients Receiving Chemotherapy  Today you received the following chemotherapy agents Herceptin.  To help prevent nausea and vomiting after your treatment, we encourage you to take your nausea medication none ordered---contact MD office if needed.   If you develop nausea and vomiting that is not controlled by your nausea medication, call the clinic.   BELOW ARE SYMPTOMS THAT SHOULD BE REPORTED IMMEDIATELY:  *FEVER GREATER THAN 100.5 F  *CHILLS WITH OR WITHOUT FEVER  NAUSEA AND VOMITING THAT IS NOT CONTROLLED WITH YOUR NAUSEA MEDICATION  *UNUSUAL SHORTNESS OF BREATH  *UNUSUAL BRUISING OR BLEEDING  TENDERNESS IN MOUTH AND THROAT WITH OR WITHOUT PRESENCE OF ULCERS  *URINARY PROBLEMS  *BOWEL PROBLEMS  UNUSUAL RASH Items with * indicate a potential emergency and should be followed up as soon as possible.  Feel free to call the clinic you have any questions or concerns. The clinic phone number is [phone].

## 2013-02-07 NOTE — Patient Instructions (Addendum)
1.  Letrozole 2.5mg  daily/femara  2. Arimidex 1 mg daily/anastrazole  3.  Aromasin 25mg  daily/exemestane  Letrozole tablets What is this medicine? LETROZOLE (LET roe zole) blocks the production of estrogen. Certain types of breast cancer grow under the influence of estrogen. Letrozole helps block tumor growth. This medicine is used to treat advanced breast cancer in postmenopausal women. This medicine may be used for other purposes; ask your health care provider or pharmacist if you have questions. What should I tell my health care provider before I take this medicine? They need to know if you have any of these conditions: -liver disease -osteoporosis (weak bones) -an unusual or allergic reaction to letrozole, other medicines, foods, dyes, or preservatives -pregnant or trying to get pregnant -breast-feeding How should I use this medicine? Take this medicine by mouth with a glass of water. You may take it with or without food. Follow the directions on the prescription label. Take your medicine at regular intervals. Do not take your medicine more often than directed. Do not stop taking except on your doctor's advice. Talk to your pediatrician regarding the use of this medicine in children. Special care may be needed. Overdosage: If you think you have taken too much of this medicine contact a poison control center or emergency room at once. NOTE: This medicine is only for you. Do not share this medicine with others. What if I miss a dose? If you miss a dose, take it as soon as you can. If it is almost time for your next dose, take only that dose. Do not take double or extra doses. What may interact with this medicine? Do not take this medicine with any of the following medications: -estrogens, like hormone replacement therapy or birth control pills This medicine may also interact with the following medications: -dietary supplements such as androstenedione or  DHEA -prasterone -tamoxifen This list may not describe all possible interactions. Give your health care provider a list of all the medicines, herbs, non-prescription drugs, or dietary supplements you use. Also tell them if you smoke, drink alcohol, or use illegal drugs. Some items may interact with your medicine. What should I watch for while using this medicine? Visit your doctor or health care professional for regular check-ups to monitor your condition. Do not use this drug if you are pregnant. Serious side effects to an unborn child are possible. Talk to your doctor or pharmacist for more information. You may get drowsy or dizzy. Do not drive, use machinery, or do anything that needs mental alertness until you know how this medicine affects you. Do not stand or sit up quickly, especially if you are an older patient. This reduces the risk of dizzy or fainting spells. What side effects may I notice from receiving this medicine? Side effects that you should report to your doctor or health care professional as soon as possible: -allergic reactions like skin rash, itching, or hives -bone fracture -chest pain -difficulty breathing or shortness of breath -severe pain, swelling, warmth in the leg -unusually weak or tired -vaginal bleeding Side effects that usually do not require medical attention (report to your doctor or health care professional if they continue or are bothersome): -bone, back, joint, or muscle pain -dizziness -fatigue -fluid retention -headache -hot flashes, night sweats -nausea -weight gain This list may not describe all possible side effects. Call your doctor for medical advice about side effects. You may report side effects to FDA at 1-800-FDA-1088. Where should I keep my medicine? Keep out of  the reach of children. Store between 15 and 30 degrees C (59 and 86 degrees F). Throw away any unused medicine after the expiration date. NOTE: This sheet is a summary. It may  not cover all possible information. If you have questions about this medicine, talk to your doctor, pharmacist, or health care provider.  2012, [employer]. (07/06/2007 4:43:44 PM)  Anastrozole tablets What is this medicine? ANASTROZOLE (an AS troe zole) is used to treat breast cancer in women who have gone through menopause. Some types of breast cancer depend on estrogen to grow, and this medicine can stop tumor growth by blocking estrogen production. This medicine may be used for other purposes; ask your health care provider or pharmacist if you have questions. What should I tell my health care provider before I take this medicine? They need to know if you have any of these conditions: -liver disease -an unusual or allergic reaction to anastrozole, other medicines, foods, dyes, or preservatives -pregnant or trying to get pregnant -breast-feeding How should I use this medicine? Take this medicine by mouth with a glass of water. Follow the directions on the prescription label. You can take this medicine with or without food. Take your doses at regular intervals. Do not take your medicine more often than directed. Do not stop taking except on the advice of your doctor or health care professional. Talk to your pediatrician regarding the use of this medicine in children. Special care may be needed. Overdosage: If you think you have taken too much of this medicine contact a poison control center or emergency room at once. NOTE: This medicine is only for you. Do not share this medicine with others. What if I miss a dose? If you miss a dose, take it as soon as you can. If it is almost time for your next dose, take only that dose. Do not take double or extra doses. What may interact with this medicine? Do not take this medicine with any of the following medications: -female hormones, like estrogens or progestins and birth control pills This medicine may also interact with the following  medications: -tamoxifen This list may not describe all possible interactions. Give your health care provider a list of all the medicines, herbs, non-prescription drugs, or dietary supplements you use. Also tell them if you smoke, drink alcohol, or use illegal drugs. Some items may interact with your medicine. What should I watch for while using this medicine? Visit your doctor or health care professional for regular checks on your progress. Let your doctor or health care professional know about any unusual vaginal bleeding. Do not treat yourself for diarrhea, nausea, vomiting or other side effects. Ask your doctor or health care professional for advice. What side effects may I notice from receiving this medicine? Side effects that you should report to your doctor or health care professional as soon as possible: -allergic reactions like skin rash, itching or hives, swelling of the face, lips, or tongue -any new or unusual symptoms -breathing problems -chest pain -leg pain or swelling -vomiting Side effects that usually do not require medical attention (report to your doctor or health care professional if they continue or are bothersome): -back or bone pain -cough, or throat infection -diarrhea or constipation -dizziness -headache -hot flashes -loss of appetite -nausea -sweating -weakness and tiredness -weight gain This list may not describe all possible side effects. Call your doctor for medical advice about side effects. You may report side effects to FDA at 1-800-FDA-1088. Where should I  keep my medicine? Keep out of the reach of children. Store at room temperature between 20 and 25 degrees C (68 and 77 degrees F). Throw away any unused medicine after the expiration date. NOTE: This sheet is a summary. It may not cover all possible information. If you have questions about this medicine, talk to your doctor, pharmacist, or health care provider.  2013, [employer].  (07/06/2007 4:31:52 PM)  Exemestane tablets What is this medicine? EXEMESTANE (ex e MES tane) blocks the production of the hormone estrogen. Some types of breast cancer depend on estrogen to grow, and this medicine can stop tumor growth by blocking estrogen production. This medicine is for the treatment of breast cancer in postmenopausal women only. This medicine may be used for other purposes; ask your health care provider or pharmacist if you have questions. What should I tell my health care provider before I take this medicine? They need to know if you have any of these conditions: -an unusual or allergic reaction to exemestane, other medicines, foods, dyes, or preservatives -pregnant or trying to get pregnant -breast-feeding How should I use this medicine? Take this medicine by mouth with a glass of water. Follow the directions on the prescription label. Take your doses at regular intervals after a meal. Do not take your medicine more often than directed. Do not stop taking except on the advice of your doctor or health care professional. Contact your pediatrician regarding the use of this medicine in children. Special care may be needed. Overdosage: If you think you have taken too much of this medicine contact a poison control center or emergency room at once. NOTE: This medicine is only for you. Do not share this medicine with others. What if I miss a dose? If you miss a dose, take the next dose as usual. Do not try to make up the missed dose. Do not take double or extra doses. What may interact with this medicine? Do not take this medicine with any of the following medications: -female hormones, like estrogens and birth control pills This medicine may also interact with the following medications: -androstenedione -phenytoin -rifabutin, rifampin, or rifapentine -St. John's Wort This list may not describe all possible interactions. Give your health care provider a list of all the  medicines, herbs, non-prescription drugs, or dietary supplements you use. Also tell them if you smoke, drink alcohol, or use illegal drugs. Some items may interact with your medicine. What should I watch for while using this medicine? Visit your doctor or health care professional for regular checks on your progress. If you experience hot flashes or sweating while taking this medicine, avoid alcohol, smoking and drinks with caffeine. This may help to decrease these side effects. What side effects may I notice from receiving this medicine? Side effects that you should report to your doctor or health care professional as soon as possible: -any new or unusual symptoms -changes in vision -fever -leg or arm swelling -pain in bones, joints, or muscles -pain in hips, back, ribs, arms, shoulders, or legs Side effects that usually do not require medical attention (report to your doctor or health care professional if they continue or are bothersome): -difficulty sleeping -headache -hot flashes -sweating -unusually weak or tired This list may not describe all possible side effects. Call your doctor for medical advice about side effects. You may report side effects to FDA at 1-800-FDA-1088. Where should I keep my medicine? Keep out of the reach of children. Store at room temperature between 15  and 30 degrees C (59 and 86 degrees F). Throw away any unused medicine after the expiration date. NOTE: This sheet is a summary. It may not cover all possible information. If you have questions about this medicine, talk to your doctor, pharmacist, or health care provider.  2012, [employer]. (08/28/2007 11:48:29 AM)  Alendronate weekly tablets What is this medicine? ALENDRONATE (a LEN droe nate) slows calcium loss from bones. It helps to make healthy bone and to slow bone loss in people with osteoporosis. It may be used to treat Paget's disease. This medicine may be used for other purposes; ask your  health care provider or pharmacist if you have questions. What should I tell my health care provider before I take this medicine? They need to know if you have any of these conditions: -esophagus, stomach, or intestine problems, like acid-reflux or GERD -dental disease -kidney disease -low blood calcium -low vitamin D -problems swallowing -problems sitting or standing for 30 minutes -an unusual or allergic reaction to alendronate, other medicines, foods, dyes, or preservatives -pregnant or trying to get pregnant -breast-feeding How should I use this medicine? You must take this medicine exactly as directed or you will lower the amount of medicine you absorb into your body or you may cause yourself harm. Take your dose by mouth first thing in the morning, after you are up for the day. Do not eat or drink anything before you take this medicine. Swallow your medicine with a full glass (6 to 8 fluid ounces) of plain water. Do not take this tablet with any other drink. Do not chew or crush the tablet. After taking this medicine, do not eat breakfast, drink, or take any medicines or vitamins for at least 30 minutes. Stand or sit up for at least 30 minutes after you take this medicine; do not lie down. Take this medicine on the same day every week. Do not take your medicine more often than directed. Talk to your pediatrician regarding the use of this medicine in children. Special care may be needed. Overdosage: If you think you have taken too much of this medicine contact a poison control center or emergency room at once. NOTE: This medicine is only for you. Do not share this medicine with others. What if I miss a dose? If you miss a dose, take the dose on the morning after you remember. Then take your next dose on your regular day of the week. Never take 2 tablets on the same day. Do not take double or extra doses. What may interact with this medicine? -aluminum  hydroxide -antacids -aspirin -calcium supplements -drugs for inflammation like ibuprofen, naproxen, and others -iron supplements -magnesium supplements -vitamins with minerals This list may not describe all possible interactions. Give your health care provider a list of all the medicines, herbs, non-prescription drugs, or dietary supplements you use. Also tell them if you smoke, drink alcohol, or use illegal drugs. Some items may interact with your medicine. What should I watch for while using this medicine? Visit your doctor or health care professional for regular checks ups. It may be some time before you see benefit from this medicine. Do not stop taking your medication except on your doctor's advice. Your doctor or health care professional may order blood tests and other tests to see how you are doing. You should make sure you get enough calcium and vitamin D while you are taking this medicine, unless your doctor tells you not to. Discuss the foods you eat  and the vitamins you take with your health care professional. Some people who take this medicine have severe bone, joint, and/or muscle pain. This medicine may also increase your risk for a broken thigh bone. Tell your doctor right away if you have pain in your upper leg or groin. Tell your doctor if you have any pain that does not go away or that gets worse. This medicine can make you more sensitive to the sun. If you get a rash while taking this medicine, sunlight may cause the rash to get worse. Keep out of the sun. If you cannot avoid being in the sun, wear protective clothing and use sunscreen. Do not use sun lamps or tanning beds/booths. What side effects may I notice from receiving this medicine? Side effects that you should report to your doctor or health care professional as soon as possible: -allergic reactions like skin rash, itching or hives, swelling of the face, lips, or tongue -black or tarry stools -bone, muscle or joint  pain -changes in vision -chest pain -heartburn or stomach pain -jaw pain, especially after dental work -pain or trouble when swallowing -redness, blistering, peeling or loosening of the skin, including inside the mouth Side effects that usually do not require medical attention (report to your doctor or health care professional if they continue or are bothersome): -changes in taste -diarrhea or constipation -eye pain or itching -headache -nausea or vomiting -stomach gas or fullness This list may not describe all possible side effects. Call your doctor for medical advice about side effects. You may report side effects to FDA at 1-800-FDA-1088. Where should I keep my medicine? Keep out of the reach of children. Store at room temperature of 15 and 30 degrees C (59 and 86 degrees F). Throw away any unused medicine after the expiration date. NOTE: This sheet is a summary. It may not cover all possible information. If you have questions about this medicine, talk to your doctor, pharmacist, or health care provider.  2013, [employer]. (03/10/2011 9:02:42 AM) Denosumab injection What is this medicine? DENOSUMAB slows bone breakdown. It is used to treat osteoporosis in women after menopause and in men. This medicine is also used to prevent bone fractures and other bone problems caused by cancer bone metastases. This medicine may be used for other purposes; ask your health care provider or pharmacist if you have questions. What should I tell my health care provider before I take this medicine? They need to know if you have any of these conditions: -dental disease -eczema -infection or history of infections -kidney disease or on dialysis -low blood calcium or vitamin D -malabsorption syndrome -scheduled to have surgery or tooth extraction -taking medicine that contains denosumab -thyroid or parathyroid disease -an unusual reaction to denosumab, other medicines, foods, dyes, or  preservatives -pregnant or trying to get pregnant -breast-feeding How should I use this medicine? This medicine is for injection under the skin. It is given by a health care professional in a hospital or clinic setting. If you are getting Prolia, a special MedGuide will be given to you by the pharmacist with each prescription and refill. Be sure to read this information carefully each time. Talk to your pediatrician regarding the use of this medicine in children. Special care may be needed. Overdosage: If you think you've taken too much of this medicine contact a poison control center or emergency room at once. Overdosage: If you think you have taken too much of this medicine contact a poison control center or  emergency room at once. NOTE: This medicine is only for you. Do not share this medicine with others. What if I miss a dose? It is important not to miss your dose. Call your doctor or health care professional if you are unable to keep an appointment. What may interact with this medicine? Do not take this medicine with any of the following medications: -other medicines containing denosumab This medicine may also interact with the following medications: -medicines that suppress the immune system -medicines that treat cancer -steroid medicines like prednisone or cortisone This list may not describe all possible interactions. Give your health care provider a list of all the medicines, herbs, non-prescription drugs, or dietary supplements you use. Also tell them if you smoke, drink alcohol, or use illegal drugs. Some items may interact with your medicine. What should I watch for while using this medicine? Visit your doctor or health care professional for regular checks on your progress. Your doctor or health care professional may order blood tests and other tests to see how you are doing. Call your doctor or health care professional if you get a cold or other infection while receiving this  medicine. Do not treat yourself. This medicine may decrease your body's ability to fight infection. You should make sure you get enough calcium and vitamin D while you are taking this medicine, unless your doctor tells you not to. Discuss the foods you eat and the vitamins you take with your health care professional. See your dentist regularly. Brush and floss your teeth as directed. Before you have any dental work done, tell your dentist you are receiving this medicine. What side effects may I notice from receiving this medicine? Side effects that you should report to your doctor or health care professional as soon as possible: -allergic reactions like skin rash, itching or hives, swelling of the face, lips, or tongue -breathing problems -chest pain -fast, irregular heartbeat -feeling faint or lightheaded, falls -fever, chills, or any other sign of infection -muscle spasms, tightening, or twitches -numbness or tingling -skin blisters or bumps, or is dry, peels, or red -slow healing or unexplained pain in the mouth or jaw -unusual bleeding or bruising Side effects that usually do not require medical attention (Report these to your doctor or health care professional if they continue or are bothersome.): -muscle pain -stomach upset, gas This list may not describe all possible side effects. Call your doctor for medical advice about side effects. You may report side effects to FDA at 1-800-FDA-1088. Where should I keep my medicine? This medicine is only given in a clinic, doctor's office, or other health care setting and will not be stored at home. NOTE: This sheet is a summary. It may not cover all possible information. If you have questions about this medicine, talk to your doctor, pharmacist, or health care provider.  2013, [employer]. (02/01/2011 3:40:41 PM)

## 2013-02-19 ENCOUNTER — Ambulatory Visit: Payer: Medicare Other | Attending: Oncology | Admitting: Physical Therapy

## 2013-02-19 DIAGNOSIS — M629 Disorder of muscle, unspecified: Secondary | ICD-10-CM | POA: Insufficient documentation

## 2013-02-19 DIAGNOSIS — IMO0001 Reserved for inherently not codable concepts without codable children: Secondary | ICD-10-CM | POA: Insufficient documentation

## 2013-02-19 DIAGNOSIS — M2420 Disorder of ligament, unspecified site: Secondary | ICD-10-CM | POA: Insufficient documentation

## 2013-02-28 ENCOUNTER — Encounter: Payer: Self-pay | Admitting: Adult Health

## 2013-02-28 ENCOUNTER — Ambulatory Visit: Payer: Medicare Other | Admitting: Oncology

## 2013-02-28 ENCOUNTER — Telehealth: Payer: Self-pay | Admitting: Oncology

## 2013-02-28 ENCOUNTER — Ambulatory Visit (HOSPITAL_BASED_OUTPATIENT_CLINIC_OR_DEPARTMENT_OTHER): Payer: Medicare Other | Admitting: Adult Health

## 2013-02-28 ENCOUNTER — Other Ambulatory Visit (HOSPITAL_BASED_OUTPATIENT_CLINIC_OR_DEPARTMENT_OTHER): Payer: Medicare Other | Admitting: Lab

## 2013-02-28 ENCOUNTER — Ambulatory Visit (HOSPITAL_BASED_OUTPATIENT_CLINIC_OR_DEPARTMENT_OTHER): Payer: Medicare Other

## 2013-02-28 ENCOUNTER — Telehealth: Payer: Self-pay | Admitting: *Deleted

## 2013-02-28 VITALS — BP 147/85 | HR 73 | Temp 97.7°F | Resp 18 | Ht 64.0 in | Wt 139.2 lb

## 2013-02-28 DIAGNOSIS — C50419 Malignant neoplasm of upper-outer quadrant of unspecified female breast: Secondary | ICD-10-CM

## 2013-02-28 DIAGNOSIS — C50411 Malignant neoplasm of upper-outer quadrant of right female breast: Secondary | ICD-10-CM

## 2013-02-28 DIAGNOSIS — C773 Secondary and unspecified malignant neoplasm of axilla and upper limb lymph nodes: Secondary | ICD-10-CM

## 2013-02-28 DIAGNOSIS — Z5112 Encounter for antineoplastic immunotherapy: Secondary | ICD-10-CM

## 2013-02-28 DIAGNOSIS — C50412 Malignant neoplasm of upper-outer quadrant of left female breast: Secondary | ICD-10-CM

## 2013-02-28 DIAGNOSIS — Z170 Estrogen receptor positive status [ER+]: Secondary | ICD-10-CM

## 2013-02-28 DIAGNOSIS — M899 Disorder of bone, unspecified: Secondary | ICD-10-CM

## 2013-02-28 LAB — COMPREHENSIVE METABOLIC PANEL (CC13)
ALT: 15 U/L (ref 0–55)
AST: 24 U/L (ref 5–34)
Albumin: 3.9 g/dL (ref 3.5–5.0)
Alkaline Phosphatase: 80 U/L (ref 40–150)
Anion Gap: 9 mEq/L (ref 3–11)
BUN: 18.3 mg/dL (ref 7.0–26.0)
CO2: 26 mEq/L (ref 22–29)
Calcium: 10.2 mg/dL (ref 8.4–10.4)
Chloride: 101 mEq/L (ref 98–109)
Creatinine: 0.9 mg/dL (ref 0.6–1.1)
Glucose: 90 mg/dl (ref 70–140)
Potassium: 4 mEq/L (ref 3.5–5.1)
Sodium: 136 mEq/L (ref 136–145)
Total Bilirubin: 0.8 mg/dL (ref 0.20–1.20)
Total Protein: 7.4 g/dL (ref 6.4–8.3)

## 2013-02-28 LAB — CBC WITH DIFFERENTIAL/PLATELET
BASO%: 0.8 % (ref 0.0–2.0)
Basophils Absolute: 0 10*3/uL (ref 0.0–0.1)
EOS%: 2.7 % (ref 0.0–7.0)
Eosinophils Absolute: 0.1 10*3/uL (ref 0.0–0.5)
HCT: 38.2 % (ref 34.8–46.6)
HGB: 12.8 g/dL (ref 11.6–15.9)
LYMPH%: 24.1 % (ref 14.0–49.7)
MCH: 28.9 pg (ref 25.1–34.0)
MCHC: 33.5 g/dL (ref 31.5–36.0)
MCV: 86.2 fL (ref 79.5–101.0)
MONO#: 0.4 10*3/uL (ref 0.1–0.9)
MONO%: 10.8 % (ref 0.0–14.0)
NEUT#: 2.3 10*3/uL (ref 1.5–6.5)
NEUT%: 61.6 % (ref 38.4–76.8)
Platelets: 174 10*3/uL (ref 145–400)
RBC: 4.43 10*6/uL (ref 3.70–5.45)
RDW: 13.9 % (ref 11.2–14.5)
WBC: 3.7 10*3/uL — ABNORMAL LOW (ref 3.9–10.3)
lymph#: 0.9 10*3/uL (ref 0.9–3.3)

## 2013-02-28 MED ORDER — TRASTUZUMAB CHEMO INJECTION 440 MG
6.0000 mg/kg | Freq: Once | INTRAVENOUS | Status: AC
  Administered 2013-02-28: 378 mg via INTRAVENOUS
  Filled 2013-02-28: qty 18

## 2013-02-28 MED ORDER — SODIUM CHLORIDE 0.9 % IV SOLN: Freq: Once | INTRAVENOUS | Status: DC

## 2013-02-28 MED ORDER — HEPARIN SOD (PORK) LOCK FLUSH 100 UNIT/ML IV SOLN
500.0000 [IU] | Freq: Once | INTRAVENOUS | Status: DC | PRN
  Filled 2013-02-28: qty 5

## 2013-02-28 MED ORDER — ACETAMINOPHEN 325 MG PO TABS
650.0000 mg | ORAL_TABLET | Freq: Once | ORAL | Status: AC
  Administered 2013-02-28: 650 mg via ORAL

## 2013-02-28 MED ORDER — HEPARIN SOD (PORK) LOCK FLUSH 100 UNIT/ML IV SOLN
250.0000 [IU] | Freq: Once | INTRAVENOUS | Status: DC | PRN
  Filled 2013-02-28: qty 5

## 2013-02-28 MED ORDER — DIPHENHYDRAMINE HCL 25 MG PO CAPS
25.0000 mg | ORAL_CAPSULE | Freq: Once | ORAL | Status: AC
  Administered 2013-02-28: 25 mg via ORAL

## 2013-02-28 MED ORDER — ACETAMINOPHEN 325 MG PO TABS
ORAL_TABLET | ORAL | Status: AC
  Filled 2013-02-28: qty 2

## 2013-02-28 MED ORDER — DIPHENHYDRAMINE HCL 25 MG PO CAPS
ORAL_CAPSULE | ORAL | Status: AC
  Filled 2013-02-28: qty 1

## 2013-02-28 MED ORDER — SODIUM CHLORIDE 0.9 % IJ SOLN
10.0000 mL | INTRAMUSCULAR | Status: DC | PRN
  Filled 2013-02-28: qty 10

## 2013-02-28 NOTE — Patient Instructions (Signed)
Doing well.  Continue Letrozole daily.  Proceed with Herceptin.  Please call us if you have any questions or concerns.    We will see you back in 3 weeks.

## 2013-02-28 NOTE — Telephone Encounter (Signed)
, °

## 2013-02-28 NOTE — Progress Notes (Signed)
OFFICE PROGRESS NOTE   [name], MD [address] [place] [address] Dr. Chevis Pretty Dr. Chipper Herb  DIAGNOSIS: 67 year old female with invasive lobular breast cancer, stage IIB, receiving adjuvant chemotherapy  PRIOR THERAPY:  #1 patient was originally seen in the multidisciplinary breast clinic on 03/14/2012 for new diagnosis of invasive ductal carcinoma that was HER-2 positive node-positive, ER positive PR negative with Ki-67 of 44%  #2 on MRI patient also had an anterior breast mass that was to be biopsied to decide whether or not she should receive neoadjuvant chemotherapy or adjuvant chemotherapy. Patient's mass was biopsied and it was positive for a malignancy that was HER-2/neu negative.  #3 patient is now status post right mastectomy with axillary lymph node dissection with the final pathology revealing the largest tumor measuring 5 cm, 1.7 cm and 1.6 cm. Tumor was ER positive PR negative HER-2/neu positive with a Ki-67 of 44% 3 of 7 lymph nodes were positive for metastatic disease.  #4 patient will proceed with adjuvant chemotherapy and Herceptin. Chemotherapy will consist of Taxotere carboplatinum given every 21 days with weekly Herceptin. Total of 6 cycles of Taxotere carboplatin will be administered starting on 06/14/2012. She began adjuvant every 3 week herceptin on 5/29.  Because patient's tumor is ER positive she will also be given antiestrogen therapy adjuvantly.  #5 Radiation therapy beginning 10/25/12, finished on 12/11/12.    #6 Patient started on Letrozole on 02/07/2013.  CURRENT THERAPY: Every 3 week Herceptin, Letrozole daily  INTERVAL HISTORY: Ms. Cullen is here for f/u prior to her adjuvant Herceptin. She's doing well today.  She has been on Letrozole for one week and is tolerating it well.  She does have mild lower back aches, and had one episode of cramping in her calves.  Otherwise, she denies chest pain, palpitations, swelling, orthopnea,  DOE, or any further concerns.  She continues to undergo pelvic PT for urge incontinence, this is improving it greatly.    MEDICAL HISTORY: Past Medical History  Diagnosis Date  . Chronic kidney disease   . Hx: UTI (urinary tract infection)   . Right shoulder pain   . Right knee pain   . Hypertension     recently started Aldactone   . Hyperlipidemia     but not on meds;diet and exercise controlled  . Dizziness     has been going on for 42months;medical MD aware  . H/O hiatal hernia   . GERD (gastroesophageal reflux disease)     Tums prn  . Hemorrhoids   . Urinary frequency     d/t taking Aldactone  . History of UTI   . Anemia     hx of  . Cataract     immature-not sure of which eye  . Anxiety     r/t updated surgery  . Insomnia     takes Melatoniin daily  . Complication of anesthesia     pt states she is sensitive to meds  . Breast cancer     right  . Skin cancer   . Hx of radiation therapy 10/25/12- 12/11/12    right chest wall/regional lymph nodes 5040 cGy, 28 sessions, right chest wall boost 1000 cGy 1 session    ALLERGIES:  is allergic to codeine; keflex; naprosyn; and tussin.  MEDICATIONS:  Current Outpatient Prescriptions  Medication Sig Dispense Refill  . B Complex-C (SUPER B COMPLEX PO) Take 1 each by mouth daily.      . calcium carbonate (TUMS -  DOSED IN MG ELEMENTAL CALCIUM) 500 MG chewable tablet Chew 1 tablet by mouth daily.      . Cholecalciferol (VITAMIN D-3 PO) Take 4,000 Units by mouth daily.      . clindamycin (CLINDAGEL) 1 % gel Apply topically 2 (two) times daily.  30 g  0  . emollient (BIAFINE) cream Apply 1 application topically as needed.      . furosemide (LASIX) 20 MG tablet Take 1 tablet (20 mg total) by mouth daily as needed.  30 tablet  3  . hyaluronate sodium (RADIAPLEXRX) GEL Apply 1 application topically 2 (two) times daily. 2nd tube 11/19/12      . letrozole (FEMARA) 2.5 MG tablet Take 1 tablet (2.5 mg total) by mouth daily.  30 tablet  2   . lidocaine-prilocaine (EMLA) cream Apply topically as needed.  30 g  7  . magnesium gluconate (MAGONATE) 500 MG tablet Take 500 mg by mouth 2 (two) times daily.      . Olopatadine HCl 0.2 % SOLN Apply 1 drop to eye 2 (two) times daily.  2.5 mL  0  . omeprazole (PRILOSEC) 40 MG capsule Take 1 capsule (40 mg total) by mouth daily.  30 capsule  6  . potassium chloride SA (K-DUR,KLOR-CON) 20 MEQ tablet Take 1 tablet (20 mEq total) by mouth daily as needed. When you take Furosemide  90 tablet  3  . PREVIDENT 5000 BOOSTER PLUS 1.1 % PSTE Take 1 application by mouth Twice daily.      . Probiotic Product (PROBIOTIC DAILY PO) Take 1 each by mouth.      . spironolactone (ALDACTONE) 25 MG tablet TAKE ONE-HALF TABLET (12.5MG ) BY MOUTH EVERY DAY  30 tablet  3  . UNABLE TO FIND Rx: L8000- Post Surgical Bra (Quantity: 6) L8030- Silicone Breast Prosthesis (Quantity: 1) Dx: 174.9; Right mastectomy  1 each  0   No current facility-administered medications for this visit.    SURGICAL HISTORY:  Past Surgical History  Procedure Laterality Date  . Cyst removed from left breast  1970  . Left wrist surgery   2004    with plate  . Colonoscopy    . Esophagogastroduodenoscopy    . Mastectomy, radical    . Mastectomy modified radical  05/10/2012    Procedure: MASTECTOMY MODIFIED RADICAL;  Surgeon: Robyne Askew, MD;  Location: [facility] OR;  Service: General;  Laterality: Right;  RIGHT MODIFIED RADICAL MASTECTOMY  . Portacath placement  05/10/2012    Procedure: INSERTION PORT-A-CATH;  Surgeon: Robyne Askew, MD;  Location: [facility] OR;  Service: General;  Laterality: Left;    REVIEW OF SYSTEMS:   A 10 point review of systems was conducted and is otherwise negative except for what is noted above.    Health Maintenance  Mammogram: 02/2013 scheduled Colonoscopy: 2 years ago Bone Density Scan: 01/03/13 Pap Smear: 10/2011 Eye Exam: 02/12/2013 Vitamin D Level: 01/17/13 Lipid Panel:01/17/13   PHYSICAL EXAMINATION: Blood  pressure 147/85, pulse 73, temperature 97.7 F (36.5 C), temperature source Oral, resp. rate 18, height 5\' 4"  (1.626 m), weight 139 lb 3.2 oz (63.141 kg). Body mass index is 23.88 kg/(m^2). General: Patient is a well appearing female in no acute distress HEENT: PERRLA, sclerae anicteric no conjunctival pallor, MMM Neck: supple, no palpable adenopathy Lungs: clear to auscultation bilaterally, no wheezes, rhonchi, or rales Cardiovascular: regular rate rhythm, S1, S2, no murmurs, rubs or gallops Abdomen: Soft, non-tender, non-distended, normoactive bowel sounds, no HSM Extremities: warm and well  perfused, no clubbing, cyanosis, or edema Skin: No rashes or lesions Neuro: Non-focal Breasts: The right breast is status post mastectomy. There is no evidence of local recurrence. The right axilla is benign.  Erythema resolved to right chest wall. The left breast is unremarkable. ECOG PERFORMANCE STATUS: 1   LABORATORY DATA: Lab Results  Component Value Date   WBC 3.7* 02/28/2013   HGB 12.8 02/28/2013   HCT 38.2 02/28/2013   MCV 86.2 02/28/2013   PLT 174 02/28/2013      Chemistry      Component Value Date/Time   NA 136 02/28/2013 0849   NA 138 05/11/2012 0640   K 4.0 02/28/2013 0849   K 4.7 05/11/2012 0640   CL 101 10/25/2012 0946   CL 99 05/11/2012 0640   CO2 26 02/28/2013 0849   CO2 20 05/11/2012 0640   BUN 18.3 02/28/2013 0849   BUN 12 05/11/2012 0640   CREATININE 0.9 02/28/2013 0849   CREATININE 0.88 05/11/2012 0640      Component Value Date/Time   CALCIUM 10.2 02/28/2013 0849   CALCIUM 9.7 05/11/2012 0640   ALKPHOS 80 02/28/2013 0849   AST 24 02/28/2013 0849   ALT 15 02/28/2013 0849   BILITOT 0.80 02/28/2013 0849     ADDITIONAL INFORMATION: CHROMOGENIC IN-SITU HYBRIDIZATION (1C - TUMOR WITH CLIP) Interpretation HER-2/NEU BY CISH - NO AMPLIFICATION OF HER-2 DETECTED. THE RATIO OF HER-2: CEP 17 SIGNALS WAS 1.25. Reference range: Ratio: HER2:CEP17 < 1.8 - gene amplification not  observed Ratio: HER2:CEP 17 1.8-2.2 - equivocal result Ratio: HER2:CEP17 > 2.2 - gene amplification observed CHROMOGENIC IN-SITU HYBRIDIZATION (1E - TUMOR WITHOUT CLIP) Interpretation: HER2/NEU BY CISH - SHOWS AMPLIFICATION BY CISH ANALYSIS. THE RATIO OF HER2: CEP 17 SIGNALS WAS 2.83. Reference range: Ratio: HER2:CEP17 < 1.8 gene amplification not observed Ratio: HER2:CEP 17 1.8-2.2 - equivocal result Ratio: HER2:CEP17 > 2.2 - gene amplification observed Pecola Leisure MD Pathologist, Electronic Signature ( Signed 05/15/2012) FINAL DIAGNOSIS Diagnosis Breast, modified radical mastectomy , right, with axillary contents 1 of 4 FINAL for ALYRIA, KRACK S (SZA14-4) Diagnosis(continued) TUMOR #1 (WITH CLIP): - INVASIVE LOBULAR CARCINOMA, GRADE II, SEE COMMENT. - LYMPHOVASCULAR INVASION IDENTIFIED. - PREVIOUS BIOPSY SITE IDENTIFIED. TUMOR #2 (NO CLIP): - INVASIVE LOBULAR CARCINOMA, GRADE II, SEE COMMENT. - LOBULAR CARCINOMA IN SITU. - LYMPHOVASCULAR INVASION IDENTIFIED. - THREE LYMPH NODES, POSITIVE FOR METASTATIC MAMMARY CARCINOMA (3/7). - TUMOR DEPOSITS ARE 1.7, 1.6, AND 5 CM. - EXTRACAPSULAR TUMOR EXTENSION IDENTIFIED. - TWO LYMPH NODES, POSITIVE FOR ISOLATED TUMOR CELLS. Microscopic Comment BREAST, INVASIVE TUMOR, WITH LYMPH NODE SAMPLING Specimen, including laterality: Right breast Procedure: Modified radical mastectomy Grade: Tumor 1 and tumor 2: Grade II of III Tubule formation: 3 Nuclear pleomorphism: 2 Mitotic: 1 Tumor size (gross measurement: Tumor #1- 2.3 cm; tumor #2 - 2.4 cm Margins: Invasive, distance to closest margin: Tumor #1 2.7 cm - deep margin; tumor #2 - 1.6 cm - deep margin Lymphovascular invasion: Present Ductal carcinoma in situ: Absent Grade: N/A Extensive intraductal component: N/A Lobular neoplasia: Both atypical hyperplasia and in situ carcinoma Extent of tumor: Skin: Grossly negative Nipple: Grossly negative Skeletal muscle: Microscopically  negative Lymph nodes: # examined: 7 Lymph nodes with metastasis: 3 Isolated tumor cells (< 0.2 mm): two lymph nodes (Slide 1P and 1O) Micrometastasis: (> 0.2 mm and < 2.0 mm): none Macrometastasis: (> 2.0 mm): 1.7 cm , 1.6 cm and 5 cm Extracapsular extension: Present Breast prognostic profile: Tumor #1: Estrogen receptor: Not repeated, previous study demonstrated 98% positivity (  [phone]) Progesterone receptor: Not repeated, previous study demonstrated 0% positivity ([id number]) Her 2 neu: Repeated, previous study demonstrated no amplification (1.38) ([id number]) Ki-67: Not repeated, previous study demonstrated 5% proliferation rate ([id number]) Tumor #2: Estrogen receptor: Not repeated, previous study demonstrated 97% positivity ([id number]) Progesterone receptor: Not repeated, previous study demonstrated 0% positivity ([id number]) Her 2 neu: Repeated, previous study demonstrated amplification (3.65) ([id number]) Ki-67: Not repeated, previous study demonstrated 44% proliferation rate ([id number]) 2 of 4 FINAL for YOLANDRA, HABIG (SZA14-4) Microscopic Comment(continued) Non-neoplastic breast: Benign fibrocystic change with usual ductal hyperplasia, sclerosing adenosis, benign radial sclerosing lesion, and microcalcifications in benign ducts and lobules. TNM: mpT2, pN1a, pMX. Comments: none  RADIOGRAPHIC STUDIES:  Mr Lodema Pilot Contrast  03/23/2012  *RADIOLOGY REPORT*  Clinical Data: New diagnosis breast cancer.  Rule out metastatic disease.  MRI HEAD WITHOUT AND WITH CONTRAST  Technique:  Multiplanar, multiecho pulse sequences of the brain and surrounding structures were obtained according to standard protocol without and with intravenous contrast  Contrast: 12mL MULTIHANCE GADOBENATE DIMEGLUMINE 529 MG/ML IV SOLN  Comparison: None.  Findings: Ventricle size is normal.  Small 4 mm hyperintensities in the cerebral white matter bilaterally do not enhance and are most likely due  to chronic microvascular ischemia.  Negative for acute infarct.  Diffusion weighted imaging is normal.  Pituitary is normal in size.  Negative for intracranial hemorrhage.  Following contrast infusion, no enhancing lesions are identified.  Paranasal sinuses are clear.  Vessels at the base of the brain are patent.  IMPRESSION: Negative for metastatic disease.  No acute abnormality.   Original Report Authenticated By: Janeece Riggers, M.D.    Mr Breast Bilateral W Wo Contrast  03/12/2012  *RADIOLOGY REPORT*  Clinical Data: Recently diagnosed invasive mammary carcinoma in the 10 o'clock region of the right breast with a metastatic right axillary lymph node.  BILATERAL BREAST MRI WITH AND WITHOUT CONTRAST  Technique: Multiplanar, multisequence MR images of both breasts were obtained prior to and following the intravenous administration of 12ml of multihance.  Three dimensional images were evaluated at the independent DynaCad workstation.  Comparison:  Mammograms dated 03/05/2012, 03/01/2012, 03/01/2011 and 02/26/2010 from [facility].  Findings: There is a moderate background parenchymal enhancement pattern.  Right breast: 1.  There is a lobulated 2.2 x 1.7 x 1.8 cm mass in the posterior third of the upper outer quadrant of the right breast.  There is a post biopsy clip artifact 1 cm lateral to the mass. 2.  In the anterior third upper outer quadrant of the right breast there is patchy non mass like enhancement measuring 0.8 x 0.3 x 0.3 cm.  Left breast:  There is no abnormal enhancement the left breast.  There are enlarged right axillary lymph nodes.  The largest right axillary lymph node measures 2.4 cm and is located lateral to the pectoralis minor consistent with level I adenopathy.  There is a 1.6 cm lymph node behind the pectoralis minor consistent with enlarged level II adenopathy.  IMPRESSION:  1.  2.2 cm mass in the posterior third of the upper outer quadrant of the right breast corresponding with the  known invasive mammary carcinoma. 2.  Patchy non mass-like enhancement in the anterior third of the upper outer quadrant of the right breast.  MR guided core biopsy is recommended. 3. Enlarged level I and II right axillary adenopathy.  RECOMMENDATION: MR guided core biopsy of the right breast.  THREE-DIMENSIONAL MR IMAGE RENDERING ON INDEPENDENT WORKSTATION:  Three-dimensional MR images were rendered  by post-processing of the original MR data on an independent workstation.  The three- dimensional MR images were interpreted, and findings were reported in the accompanying complete MRI report for this study.  BI-RADS CATEGORY 4:  Suspicious abnormality - biopsy should be considered.   Original Report Authenticated By: Baird Lyons, M.D.    Mr Biopsy/wire Localization  03/20/2012  *RADIOLOGY REPORT*  Clinical Data:  Known right breast carcinoma.  Additional area of worrisome enhancement located within the anterior upper outer quadrant of the right breast.  MRI GUIDED VACUUM ASSISTED BIOPSY OF THE RIGHT BREAST WITHOUT AND WITH CONTRAST  Comparison: Previous exams.  Technique: Multiplanar, multisequence MR images of the right breast were obtained prior to and following the intravenous administration of 12 ml of Mulithance.  I met with the patient, and we discussed the procedure of MRI guided biopsy, including risks, benefits, and alternatives. Specifically, we discussed the risks of infection, bleeding, tissue injury, clip migration, and inadequate sampling.  Informed, written consent was given.  Using sterile technique, 2% Lidocaine, MRI guidance, and a 9 gauge vacuum assisted device, biopsy was performed of the area of enhancement located within the anterior portion of the upper-outer quadrant - right breast using a lateral approach.  At the conclusion of the procedure, a  tissue marker clip was deployed into the biopsy cavity.  IMPRESSION: MRI guided biopsy of the area of enhancement located within the anterior  portion of the upper-outer quadrant of the right breast as discussed above. No apparent complications.  BI-RADS CATEGORY 6:  Known biopsy-proven malignancy - appropriate action should be taken.  THREE-DIMENSIONAL MR IMAGE RENDERING ON INDEPENDENT WORKSTATION:  Three-dimensional MR images were rendered by post-processing of the original MR data on an independent workstation.  The three- dimensional MR images were interpreted, and findings were reported in the accompanying complete MRI report for this study.  The pathology associated with the right breast MR guided core biopsy demonstrated invasive mammary carcinoma and in situ mammary carcinoma.  This is concordant with imaging findings.  I have discussed findings with the patient by telephone and answered her questions.  The patient states that biopsy site is clean and dry without hematoma formation or signs of infection.  Post biopsy wound care instructions were reviewed with the patient.  Follow-up will be with Dr. Carolynne Edouard and Dr. Welton Flakes regards her treatment plan.  The patient was encouraged to call the Breast Center for additional questions or concerns   Original Report Authenticated By: Rolla Plate, M.D.    Nm Pet Image Initial (pi) Skull Base To Thigh  03/22/2012  *RADIOLOGY REPORT*  Clinical Data: Initial treatment strategy for high risk right breast cancer.  NUCLEAR MEDICINE PET SKULL BASE TO THIGH  Fasting Blood Glucose:  100  Technique:  18.6 mCi F-18 FDG was injected intravenously. CT data was obtained and used for attenuation correction and anatomic localization only.  (This was not acquired as a diagnostic CT examination.) Additional exam technical data entered on technologist worksheet.  Comparison:  MRI breast dated 03/12/2012  Findings:  Neck: No hypermetabolic lymph nodes in the neck.  Chest:  Vague hypermetabolism in the upper/central right breast, max SUV 2.7.  Additional focal hypermetabolism in the upper/outer right breast, max SUV 2.3 (PET  image 105).  These findings correspond to biopsy-proven primary breast carcinoma.  1.3 cm short axis right subpectoral lymph node (series 2/image 66), max SUV 3.1.  Right axillary lymphadenopathy measuring up to 1.6 cm short axis (series 2/image 88), max SUV 4.8).  No hypermetabolic mediastinal or hilar nodes.  No suspicious pulmonary nodules on the CT scan.  Abdomen/Pelvis:  No abnormal hypermetabolic activity within the liver, pancreas, adrenal glands, or spleen.  No hypermetabolic lymph nodes in the abdomen or pelvis.  Skeleton:  No focal hypermetabolic activity to suggest skeletal metastasis.  IMPRESSION: Two foci of hypermetabolism in the upper right breast, max SUV 2.7, corresponding to biopsy-proven primary breast carcinoma.  Associated right subpectoral and axillary nodal metastases, max SUV 4.8.  No evidence of distant metastases.   Original Report Authenticated By: Charline Bills, M.D.    Mm Digital Diagnostic Unilat R  03/20/2012  *RADIOLOGY REPORT*  Clinical Data:  Post right breast MR guided core biopsy.  DIGITAL DIAGNOSTIC RIGHT BREAST MAMMOGRAM  Comparison:  03/05/2012, 03/01/2012, 03/01/2011, 02/26/2010 mammograms from [facility] imaging.  Findings:  Films are performed following MR guided biopsy of the anterior portion of the upper-outer quadrant of the right breast. The hourglass shaped clip placed during the MR guided core biopsy appears in appropriate position.  IMPRESSION: Appropriate positioning of clip following right breast MR guided core biopsy.   Original Report Authenticated By: Rolla Plate, M.D.     ASSESSMENT: 67 year old [place], [place] woman  #1  status post mastectomy with right axillary lymph node dissection 05/10/2012 for an mpT2 N1, stage stage IIB invasive lobular breast cancer, stage IIB, ER positive, HER-2/neu positive, with an Mib-1 between 5% and 44%  #2 Patient underwent adjuvant chemotherapy consisting of Taxotere, carboplatinum and Herceptin given every 21 days for  total of 6 cycles of Taxotere carboplatinum and weekly Herceptin beginning on 06/14/12.  Adjuvant every three week herceptin began on 10/04/12, and patient received radiation therapy from on 10/25/12 through 12/11/12.      #3 Herceptin to be continued to total one year; most recent echo 11/23/12.  Started on Letrozole 02/22/13.  PLAN:   1. Ms. Pellerito is doing well. She will proceed with Herceptin.  She will continue taking Letrozole daily.  We had a long discussion today about her osteopenia, medications, and anti-estrogen therapy.  I will contact our certification specialist to evaluate how prolia is filed.    2. She will continue to take Super B complex daily. She declined Neurontin therapy.    3. Ms. Schorsch will return in three weeks for her next Herceptin therapy.    4. Ms. Lobdell is doing well with Lasix PRN.  She will return to Dr. Gala Romney on 03/05/13.   All questions were answered. The patient knows to call the clinic with any problems, questions or concerns. We can certainly see the patient much sooner if necessary.   I spent 25 minutes counseling the patient face to face. The total time spent in the appointment was 30 minutes.  Illa Level, NP Medical Oncology [facility] [phone] 03/01/2013, 9:33 AM

## 2013-02-28 NOTE — Telephone Encounter (Signed)
Per staff message and POF I have scheduled appts.  JMW  

## 2013-03-05 ENCOUNTER — Ambulatory Visit (HOSPITAL_COMMUNITY)
Admission: RE | Admit: 2013-03-05 | Discharge: 2013-03-05 | Disposition: A | Discharge status: Home / Self Care | Payer: Medicare Other | Source: Ambulatory Visit | Attending: Family Medicine | Admitting: Family Medicine

## 2013-03-05 ENCOUNTER — Ambulatory Visit (HOSPITAL_BASED_OUTPATIENT_CLINIC_OR_DEPARTMENT_OTHER)
Admission: RE | Admit: 2013-03-05 | Discharge: 2013-03-05 | Disposition: A | Discharge status: Home / Self Care | Payer: Medicare Other | Source: Ambulatory Visit | Attending: Internal Medicine | Admitting: Internal Medicine

## 2013-03-05 VITALS — BP 128/76 | HR 76 | Wt 137.2 lb

## 2013-03-05 DIAGNOSIS — I10 Essential (primary) hypertension: Secondary | ICD-10-CM

## 2013-03-05 DIAGNOSIS — C50419 Malignant neoplasm of upper-outer quadrant of unspecified female breast: Secondary | ICD-10-CM

## 2013-03-05 DIAGNOSIS — I059 Rheumatic mitral valve disease, unspecified: Secondary | ICD-10-CM

## 2013-03-05 DIAGNOSIS — C50919 Malignant neoplasm of unspecified site of unspecified female breast: Secondary | ICD-10-CM | POA: Insufficient documentation

## 2013-03-05 DIAGNOSIS — G4700 Insomnia, unspecified: Secondary | ICD-10-CM | POA: Insufficient documentation

## 2013-03-05 DIAGNOSIS — K219 Gastro-esophageal reflux disease without esophagitis: Secondary | ICD-10-CM | POA: Insufficient documentation

## 2013-03-05 DIAGNOSIS — Z87440 Personal history of urinary (tract) infections: Secondary | ICD-10-CM | POA: Insufficient documentation

## 2013-03-05 DIAGNOSIS — I379 Nonrheumatic pulmonary valve disorder, unspecified: Secondary | ICD-10-CM | POA: Insufficient documentation

## 2013-03-05 DIAGNOSIS — Z9010 Acquired absence of unspecified breast and nipple: Secondary | ICD-10-CM | POA: Insufficient documentation

## 2013-03-05 NOTE — Progress Notes (Signed)
  Echocardiogram 2D Echocardiogram has been performed.  Georgian Co 03/05/2013, 11:49 AM

## 2013-03-05 NOTE — Addendum Note (Signed)
Encounter addended by: Noralee Space, RN on: 03/05/2013 12:12 PM<BR>     Documentation filed: Patient Instructions Section

## 2013-03-05 NOTE — Progress Notes (Signed)
Patient ID: Heather [name], female   DOB: 1945/12/18, 67 y.o.   MRN: [id number] Referring Oncologist: Dr Welton Flakes Oncologistt: Dr Welton Flakes PCP: Dr Cliffton Asters General Surgeon: Dr Carolynne Edouard  HPI: Heather [name] is a 67 year old female with invasive ductal carcinoma in R breast that is ER positive and HER-2/neu positive,  S/P R mastectomy and lymph node dissection 05/10/12.    She has been treated for HTN in the past but stopped taking the medications due to elevated renal function. Intolerant Ace-I due to cough.    She received a total of 6 cycles of Taxotere/carboplatin starting on 06/14/2012 and finishing in May. XRT finished in August 2014. Now receiving Herceptin every 3 weeks to end at the end of February 2015.    She returns for follow up. We added spironolactone 12.5 mg previously for hypertension.(Sep 13, 2012 creatinine 0.8 Potassium 3.7) . Tolerating herceptin well. Getting more active. Still with mild swelling in R arm from lymphedema. No dyspnea or orthopnea. BP runs a bit high at times but attributes to white coat HTN. She has a cough at home but not using it.  03/28/12 ECHO 60-65% Lateral S' 9.3 09/17/12 ECHO 55% lateral s' 9.4 03/05/13 ECHO EF 55% lateral s' 11.1    Past Medical History  Diagnosis Date  . Chronic kidney disease   . Hx: UTI (urinary tract infection)   . Right shoulder pain   . Right knee pain   . Hypertension     recently started Aldactone   . Hyperlipidemia     but not on meds;diet and exercise controlled  . Dizziness     has been going on for 46months;medical MD aware  . H/O hiatal hernia   . GERD (gastroesophageal reflux disease)     Tums prn  . Hemorrhoids   . Urinary frequency     d/t taking Aldactone  . History of UTI   . Anemia     hx of  . Cataract     immature-not sure of which eye  . Anxiety     r/t updated surgery  . Insomnia     takes Melatoniin daily  . Complication of anesthesia     pt states she is sensitive to meds  . Breast cancer     right  . Skin  cancer   . Hx of radiation therapy 10/25/12- 12/11/12    right chest wall/regional lymph nodes 5040 cGy, 28 sessions, right chest wall boost 1000 cGy 1 session    Current Outpatient Prescriptions  Medication Sig Dispense Refill  . B Complex-C (SUPER B COMPLEX PO) Take 1 each by mouth daily.      . calcium carbonate (TUMS - DOSED IN MG ELEMENTAL CALCIUM) 500 MG chewable tablet Chew 1 tablet by mouth daily.      . Cholecalciferol (VITAMIN D-3 PO) Take 4,000 Units by mouth daily.      . clindamycin (CLINDAGEL) 1 % gel Apply topically 2 (two) times daily.  30 g  0  . emollient (BIAFINE) cream Apply 1 application topically as needed.      . furosemide (LASIX) 20 MG tablet Take 1 tablet (20 mg total) by mouth daily as needed.  30 tablet  3  . hyaluronate sodium (RADIAPLEXRX) GEL Apply 1 application topically 2 (two) times daily. 2nd tube 11/19/12      . letrozole (FEMARA) 2.5 MG tablet Take 1 tablet (2.5 mg total) by mouth daily.  30 tablet  2  . lidocaine-prilocaine (EMLA) cream Apply topically  as needed.  30 g  7  . magnesium gluconate (MAGONATE) 500 MG tablet Take 500 mg by mouth 2 (two) times daily.      . Olopatadine HCl 0.2 % SOLN Apply 1 drop to eye 2 (two) times daily.  2.5 mL  0  . omeprazole (PRILOSEC) 40 MG capsule Take 1 capsule (40 mg total) by mouth daily.  30 capsule  6  . potassium chloride SA (K-DUR,KLOR-CON) 20 MEQ tablet Take 1 tablet (20 mEq total) by mouth daily as needed. When you take Furosemide  90 tablet  3  . PREVIDENT 5000 BOOSTER PLUS 1.1 % PSTE Take 1 application by mouth Twice daily.      . Probiotic Product (PROBIOTIC DAILY PO) Take 1 each by mouth.      . spironolactone (ALDACTONE) 25 MG tablet TAKE ONE-HALF TABLET (12.5MG ) BY MOUTH EVERY DAY  30 tablet  3  . UNABLE TO FIND Rx: L8000- Post Surgical Bra (Quantity: 6) L8030- Silicone Breast Prosthesis (Quantity: 1) Dx: 174.9; Right mastectomy  1 each  0   No current facility-administered medications for this encounter.      Allergies  Allergen Reactions  . Codeine     " loopy"  . Keflex [Cephalexin]     GI Upset  . Naprosyn [Naproxen] Other (See Comments)    GI ipset   . Tussin [Guaifenesin] Other (See Comments)    Dizzy     History   Social History  . Marital Status: Married    Spouse Name: N/A    Number of Children: 2  . Years of Education: N/A   Occupational History  . Not on file.   Social History Main Topics  . Smoking status: Never Smoker   . Smokeless tobacco: Never Used  . Alcohol Use: No  . Drug Use: No  . Sexual Activity: Yes    Birth Control/ Protection: Post-menopausal   Other Topics Concern  . Not on file   Social History Narrative  . No narrative on file    Family History  Problem Relation Age of Onset  . Leukemia Mother   . Colon cancer Brother   . Cancer Brother     Prostate  . Colon cancer Maternal Grandmother     PHYSICAL EXAM: Filed Vitals:   03/05/13 1143  BP: 128/76  Pulse: 76   General:  Well appearing. No respiratory difficulty HEENT: normal Neck: supple. no JVD. Carotids 2+ bilat; no bruits. No lymphadenopathy or thryomegaly appreciated. Cor: PMI nondisplaced. Regular rate & rhythm. No rubs, gallops or murmurs. L port-a-cath Lungs: clear Abdomen: soft, nontender, nondistended. No hepatosplenomegaly. No bruits or masses. Good bowel sounds. Extremities: no cyanosis, clubbing, rash, no lower extremity edema. Right arm sleeve Neuro: alert & oriented x 3, cranial nerves grossly intact. moves all 4 extremities w/o difficulty. Affect pleasant.   ASSESSMENT & PLAN: 1. R breast CA 2. HTN  I reviewed echos personally. EF and Doppler parameters stable. No HF on exam. Continue Herceptin. Have asked her to follow BPs at home and report to her PCP.   Daniel Bensimhon,MD 12:07 PM

## 2013-03-05 NOTE — Patient Instructions (Signed)
We will see you back in [facility] February for follow up and repeat echocardiogram

## 2013-03-08 ENCOUNTER — Ambulatory Visit: Payer: Medicare Other | Admitting: Physical Therapy

## 2013-03-21 ENCOUNTER — Other Ambulatory Visit (HOSPITAL_BASED_OUTPATIENT_CLINIC_OR_DEPARTMENT_OTHER): Payer: Medicare Other | Admitting: Lab

## 2013-03-21 ENCOUNTER — Telehealth: Payer: Self-pay | Admitting: Oncology

## 2013-03-21 ENCOUNTER — Ambulatory Visit (HOSPITAL_BASED_OUTPATIENT_CLINIC_OR_DEPARTMENT_OTHER): Payer: Medicare Other

## 2013-03-21 ENCOUNTER — Encounter: Payer: Self-pay | Admitting: Adult Health

## 2013-03-21 ENCOUNTER — Ambulatory Visit (HOSPITAL_BASED_OUTPATIENT_CLINIC_OR_DEPARTMENT_OTHER): Payer: Medicare Other | Admitting: Adult Health

## 2013-03-21 VITALS — BP 143/82 | HR 69 | Temp 98.2°F | Resp 20 | Ht 64.0 in | Wt 138.6 lb

## 2013-03-21 DIAGNOSIS — Z170 Estrogen receptor positive status [ER+]: Secondary | ICD-10-CM

## 2013-03-21 DIAGNOSIS — G620 Drug-induced polyneuropathy: Secondary | ICD-10-CM

## 2013-03-21 DIAGNOSIS — C50412 Malignant neoplasm of upper-outer quadrant of left female breast: Secondary | ICD-10-CM

## 2013-03-21 DIAGNOSIS — C50411 Malignant neoplasm of upper-outer quadrant of right female breast: Secondary | ICD-10-CM

## 2013-03-21 DIAGNOSIS — C50419 Malignant neoplasm of upper-outer quadrant of unspecified female breast: Secondary | ICD-10-CM

## 2013-03-21 DIAGNOSIS — C773 Secondary and unspecified malignant neoplasm of axilla and upper limb lymph nodes: Secondary | ICD-10-CM

## 2013-03-21 DIAGNOSIS — Z5112 Encounter for antineoplastic immunotherapy: Secondary | ICD-10-CM

## 2013-03-21 LAB — COMPREHENSIVE METABOLIC PANEL (CC13)
ALT: 15 U/L (ref 0–55)
AST: 21 U/L (ref 5–34)
Albumin: 4 g/dL (ref 3.5–5.0)
Alkaline Phosphatase: 82 U/L (ref 40–150)
Anion Gap: 12 mEq/L — ABNORMAL HIGH (ref 3–11)
BUN: 21.3 mg/dL (ref 7.0–26.0)
CO2: 25 mEq/L (ref 22–29)
Calcium: 10 mg/dL (ref 8.4–10.4)
Chloride: 101 mEq/L (ref 98–109)
Creatinine: 0.9 mg/dL (ref 0.6–1.1)
Glucose: 89 mg/dl (ref 70–140)
Potassium: 4.1 mEq/L (ref 3.5–5.1)
Sodium: 139 mEq/L (ref 136–145)
Total Bilirubin: 0.42 mg/dL (ref 0.20–1.20)
Total Protein: 7.2 g/dL (ref 6.4–8.3)

## 2013-03-21 LAB — CBC WITH DIFFERENTIAL/PLATELET
BASO%: 0.5 % (ref 0.0–2.0)
Basophils Absolute: 0 10*3/uL (ref 0.0–0.1)
EOS%: 1.7 % (ref 0.0–7.0)
Eosinophils Absolute: 0.1 10*3/uL (ref 0.0–0.5)
HCT: 39.1 % (ref 34.8–46.6)
HGB: 13 g/dL (ref 11.6–15.9)
LYMPH%: 21.1 % (ref 14.0–49.7)
MCH: 29.3 pg (ref 25.1–34.0)
MCHC: 33.2 g/dL (ref 31.5–36.0)
MCV: 88.3 fL (ref 79.5–101.0)
MONO#: 0.4 10*3/uL (ref 0.1–0.9)
MONO%: 9.8 % (ref 0.0–14.0)
NEUT#: 2.7 10*3/uL (ref 1.5–6.5)
NEUT%: 66.9 % (ref 38.4–76.8)
Platelets: 170 10*3/uL (ref 145–400)
RBC: 4.43 10*6/uL (ref 3.70–5.45)
RDW: 13.6 % (ref 11.2–14.5)
WBC: 4.1 10*3/uL (ref 3.9–10.3)
lymph#: 0.9 10*3/uL (ref 0.9–3.3)
nRBC: 0 % (ref 0–0)

## 2013-03-21 MED ORDER — ACETAMINOPHEN 325 MG PO TABS
650.0000 mg | ORAL_TABLET | Freq: Once | ORAL | Status: AC
  Administered 2013-03-21: 650 mg via ORAL

## 2013-03-21 MED ORDER — HEPARIN SOD (PORK) LOCK FLUSH 100 UNIT/ML IV SOLN
500.0000 [IU] | Freq: Once | INTRAVENOUS | Status: AC | PRN
  Administered 2013-03-21: 500 [IU]
  Filled 2013-03-21: qty 5

## 2013-03-21 MED ORDER — ACETAMINOPHEN 325 MG PO TABS
ORAL_TABLET | ORAL | Status: AC
  Filled 2013-03-21: qty 2

## 2013-03-21 MED ORDER — SODIUM CHLORIDE 0.9 % IV SOLN
Freq: Once | INTRAVENOUS | Status: AC
  Administered 2013-03-21: 11:00:00 via INTRAVENOUS

## 2013-03-21 MED ORDER — SODIUM CHLORIDE 0.9 % IJ SOLN
10.0000 mL | INTRAMUSCULAR | Status: DC | PRN
  Administered 2013-03-21: 10 mL
  Filled 2013-03-21: qty 10

## 2013-03-21 MED ORDER — SODIUM CHLORIDE 0.9 % IV SOLN
6.0000 mg/kg | Freq: Once | INTRAVENOUS | Status: AC
  Administered 2013-03-21: 378 mg via INTRAVENOUS
  Filled 2013-03-21: qty 18

## 2013-03-21 NOTE — Telephone Encounter (Signed)
Per 11/13 pof 1st and 2nd cx'd 12/24 lb/JH/tx. Per pof no need to r/s [facility] - moved lb/tx to 12/23. S/w pt she is aware and will get new schedule 12/2=4.

## 2013-03-21 NOTE — Progress Notes (Addendum)
OFFICE PROGRESS NOTE   Heather Bradford, MD [address], [address] [place] [address] Dr. Chevis Pretty Dr. Chipper Herb  DIAGNOSIS: 67 year old female with invasive lobular breast cancer, stage IIB, receiving adjuvant chemotherapy  PRIOR THERAPY:  #1 patient was originally seen in the multidisciplinary breast clinic on 03/14/2012 for new diagnosis of invasive ductal carcinoma that was HER-2 positive node-positive, ER positive PR negative with Ki-67 of 44%  #2 on MRI patient also had an anterior breast mass that was to be biopsied to decide whether or not she should receive neoadjuvant chemotherapy or adjuvant chemotherapy. Patient's mass was biopsied and it was positive for a malignancy that was HER-2/neu negative.  #3 patient is now status post right mastectomy with axillary lymph node dissection with the final pathology revealing the largest tumor measuring 5 cm, 1.7 cm and 1.6 cm. Tumor was ER positive PR negative HER-2/neu positive with a Ki-67 of 44% 3 of 7 lymph nodes were positive for metastatic disease.  #4 patient will proceed with adjuvant chemotherapy and Herceptin. Chemotherapy will consist of Taxotere carboplatinum given every 21 days with weekly Herceptin. Total of 6 cycles of Taxotere carboplatin will be administered starting on 06/14/2012. She began adjuvant every 3 week herceptin on 5/29.  Because patient's tumor is ER positive she will also be given antiestrogen therapy adjuvantly.  #5 Radiation therapy beginning 10/25/12, finished on 12/11/12.    #6 Patient started on Letrozole on 02/07/2013.  CURRENT THERAPY: Every 3 week Herceptin, Letrozole daily  INTERVAL HISTORY: Heather [name] is here for f/u prior to her adjuvant Herceptin. She's doing well today. She continues to tolerate the herceptin and Letrozole without difficulty.  She is having some muscle cramping in her legs since starting the Letrozole.  She saw Dr. Gala Romney on 10/28, was evaluated by echocardiogram and  cleared to continue Herceptin therapy.  She denies any other concerns or changes.    MEDICAL HISTORY: Past Medical History  Diagnosis Date  . Chronic kidney disease   . Hx: UTI (urinary tract infection)   . Right shoulder pain   . Right knee pain   . Hypertension     recently started Aldactone   . Hyperlipidemia     but not on meds;diet and exercise controlled  . Dizziness     has been going on for 61months;medical MD aware  . H/O hiatal hernia   . GERD (gastroesophageal reflux disease)     Tums prn  . Hemorrhoids   . Urinary frequency     d/t taking Aldactone  . History of UTI   . Anemia     hx of  . Cataract     immature-not sure of which eye  . Anxiety     r/t updated surgery  . Insomnia     takes Melatoniin daily  . Complication of anesthesia     pt states she is sensitive to meds  . Breast cancer     right  . Skin cancer   . Hx of radiation therapy 10/25/12- 12/11/12    right chest wall/regional lymph nodes 5040 cGy, 28 sessions, right chest wall boost 1000 cGy 1 session    ALLERGIES:  is allergic to codeine; keflex; naprosyn; and tussin.  MEDICATIONS:  Current Outpatient Prescriptions  Medication Sig Dispense Refill  . B Complex-C (SUPER B COMPLEX PO) Take 1 each by mouth daily.      . calcium carbonate (TUMS - DOSED IN MG ELEMENTAL CALCIUM) 500 MG chewable tablet Chew 1  tablet by mouth daily.      . Cholecalciferol (VITAMIN D-3 PO) Take 4,000 Units by mouth daily.      . clindamycin (CLINDAGEL) 1 % gel Apply topically 2 (two) times daily.  30 g  0  . emollient (BIAFINE) cream Apply 1 application topically as needed.      . furosemide (LASIX) 20 MG tablet Take 1 tablet (20 mg total) by mouth daily as needed.  30 tablet  3  . letrozole (FEMARA) 2.5 MG tablet Take 1 tablet (2.5 mg total) by mouth daily.  30 tablet  2  . lidocaine-prilocaine (EMLA) cream Apply topically as needed.  30 g  7  . magnesium gluconate (MAGONATE) 500 MG tablet Take 500 mg by mouth 2 (two)  times daily.      Marland Kitchen omeprazole (PRILOSEC) 40 MG capsule Take 1 capsule (40 mg total) by mouth daily.  30 capsule  6  . potassium chloride SA (K-DUR,KLOR-CON) 20 MEQ tablet Take 1 tablet (20 mEq total) by mouth daily as needed. When you take Furosemide  90 tablet  3  . Probiotic Product (PROBIOTIC DAILY PO) Take 1 each by mouth.      . spironolactone (ALDACTONE) 25 MG tablet TAKE ONE-HALF TABLET (12.5MG ) BY MOUTH EVERY DAY  30 tablet  3  . UNABLE TO FIND Rx: L8000- Post Surgical Bra (Quantity: 6) L8030- Silicone Breast Prosthesis (Quantity: 1) Dx: 174.9; Right mastectomy  1 each  0   No current facility-administered medications for this visit.   Facility-Administered Medications Ordered in Other Visits  Medication Dose Route Frequency Provider Last Rate Last Dose  . sodium chloride 0.9 % injection 10 mL  10 mL Intracatheter PRN Victorino December, MD   10 mL at 03/21/13 1144    SURGICAL HISTORY:  Past Surgical History  Procedure Laterality Date  . Cyst removed from left breast  1970  . Left wrist surgery   2004    with plate  . Colonoscopy    . Esophagogastroduodenoscopy    . Mastectomy, radical    . Mastectomy modified radical  05/10/2012    Procedure: MASTECTOMY MODIFIED RADICAL;  Surgeon: Robyne Askew, MD;  Location: [facility] OR;  Service: General;  Laterality: Right;  RIGHT MODIFIED RADICAL MASTECTOMY  . Portacath placement  05/10/2012    Procedure: INSERTION PORT-A-CATH;  Surgeon: Robyne Askew, MD;  Location: [facility] OR;  Service: General;  Laterality: Left;    REVIEW OF SYSTEMS:   A 10 point review of systems was conducted and is otherwise negative except for what is noted above.    Health Maintenance  Mammogram: 02/2013  Colonoscopy: 2 years ago Bone Density Scan: 01/03/13 Pap Smear: 10/2011 Eye Exam: 02/12/2013 Vitamin D Level: 01/17/13 Lipid Panel:01/17/13   PHYSICAL EXAMINATION: Blood pressure 143/82, pulse 69, temperature 98.2 F (36.8 C), temperature source Oral, resp. rate  20, height 5\' 4"  (1.626 m), weight 138 lb 9.6 oz (62.869 kg). Body mass index is 23.78 kg/(m^2). General: Patient is a well appearing female in no acute distress HEENT: PERRLA, sclerae anicteric no conjunctival pallor, MMM Neck: supple, no palpable adenopathy Lungs: clear to auscultation bilaterally, no wheezes, rhonchi, or rales Cardiovascular: regular rate rhythm, S1, S2, no murmurs, rubs or gallops Abdomen: Soft, non-tender, non-distended, normoactive bowel sounds, no HSM Extremities: warm and well perfused, no clubbing, cyanosis, or edema Skin: No rashes or lesions Neuro: Non-focal Breasts: deferred ECOG PERFORMANCE STATUS: 1   LABORATORY DATA: Lab Results  Component Value Date  WBC 4.1 03/21/2013   HGB 13.0 03/21/2013   HCT 39.1 03/21/2013   MCV 88.3 03/21/2013   PLT 170 03/21/2013      Chemistry      Component Value Date/Time   NA 139 03/21/2013 0828   NA 138 05/11/2012 0640   K 4.1 03/21/2013 0828   K 4.7 05/11/2012 0640   CL 101 10/25/2012 0946   CL 99 05/11/2012 0640   CO2 25 03/21/2013 0828   CO2 20 05/11/2012 0640   BUN 21.3 03/21/2013 0828   BUN 12 05/11/2012 0640   CREATININE 0.9 03/21/2013 0828   CREATININE 0.88 05/11/2012 0640      Component Value Date/Time   CALCIUM 10.0 03/21/2013 0828   CALCIUM 9.7 05/11/2012 0640   ALKPHOS 82 03/21/2013 0828   AST 21 03/21/2013 0828   ALT 15 03/21/2013 0828   BILITOT 0.42 03/21/2013 0828     ADDITIONAL INFORMATION: CHROMOGENIC IN-SITU HYBRIDIZATION (1C - TUMOR WITH CLIP) Interpretation HER-2/NEU BY CISH - NO AMPLIFICATION OF HER-2 DETECTED. THE RATIO OF HER-2: CEP 17 SIGNALS WAS 1.25. Reference range: Ratio: HER2:CEP17 < 1.8 - gene amplification not observed Ratio: HER2:CEP 17 1.8-2.2 - equivocal result Ratio: HER2:CEP17 > 2.2 - gene amplification observed CHROMOGENIC IN-SITU HYBRIDIZATION (1E - TUMOR WITHOUT CLIP) Interpretation: HER2/NEU BY CISH - SHOWS AMPLIFICATION BY CISH ANALYSIS. THE RATIO OF HER2: CEP  17 SIGNALS WAS 2.83. Reference range: Ratio: HER2:CEP17 < 1.8 gene amplification not observed Ratio: HER2:CEP 17 1.8-2.2 - equivocal result Ratio: HER2:CEP17 > 2.2 - gene amplification observed Pecola Leisure MD Pathologist, Electronic Signature ( Signed 05/15/2012) FINAL DIAGNOSIS Diagnosis Breast, modified radical mastectomy , right, with axillary contents 1 of 4 FINAL for Heather [name], Heather [name] S ([id number]) Diagnosis(continued) TUMOR #1 (WITH CLIP): - INVASIVE LOBULAR CARCINOMA, GRADE II, SEE COMMENT. - LYMPHOVASCULAR INVASION IDENTIFIED. - PREVIOUS BIOPSY SITE IDENTIFIED. TUMOR #2 (NO CLIP): - INVASIVE LOBULAR CARCINOMA, GRADE II, SEE COMMENT. - LOBULAR CARCINOMA IN SITU. - LYMPHOVASCULAR INVASION IDENTIFIED. - THREE LYMPH NODES, POSITIVE FOR METASTATIC MAMMARY CARCINOMA (3/7). - TUMOR DEPOSITS ARE 1.7, 1.6, AND 5 CM. - EXTRACAPSULAR TUMOR EXTENSION IDENTIFIED. - TWO LYMPH NODES, POSITIVE FOR ISOLATED TUMOR CELLS. Microscopic Comment BREAST, INVASIVE TUMOR, WITH LYMPH NODE SAMPLING Specimen, including laterality: Right breast Procedure: Modified radical mastectomy Grade: Tumor 1 and tumor 2: Grade II of III Tubule formation: 3 Nuclear pleomorphism: 2 Mitotic: 1 Tumor size (gross measurement: Tumor #1- 2.3 cm; tumor #2 - 2.4 cm Margins: Invasive, distance to closest margin: Tumor #1 2.7 cm - deep margin; tumor #2 - 1.6 cm - deep margin Lymphovascular invasion: Present Ductal carcinoma in situ: Absent Grade: N/A Extensive intraductal component: N/A Lobular neoplasia: Both atypical hyperplasia and in situ carcinoma Extent of tumor: Skin: Grossly negative Nipple: Grossly negative Skeletal muscle: Microscopically negative Lymph nodes: # examined: 7 Lymph nodes with metastasis: 3 Isolated tumor cells (< 0.2 mm): two lymph nodes (Slide 1P and 1O) Micrometastasis: (> 0.2 mm and < 2.0 mm): none Macrometastasis: (> 2.0 mm): 1.7 cm , 1.6 cm and 5 cm Extracapsular extension:  Present Breast prognostic profile: Tumor #1: Estrogen receptor: Not repeated, previous study demonstrated 98% positivity ([id number]) Progesterone receptor: Not repeated, previous study demonstrated 0% positivity ([id number]) Her 2 neu: Repeated, previous study demonstrated no amplification (1.38) ([id number]) Ki-67: Not repeated, previous study demonstrated 5% proliferation rate ([id number]) Tumor #2: Estrogen receptor: Not repeated, previous study demonstrated 97% positivity ([id number]) Progesterone receptor: Not repeated, previous study demonstrated 0% positivity ([id number]) Her 2 neu: Repeated, previous study demonstrated  amplification (3.65) ([id number]) Ki-67: Not repeated, previous study demonstrated 44% proliferation rate ([id number]) 2 of 4 FINAL for SHANIKIA, KERNODLE ([id number]) Microscopic Comment(continued) Non-neoplastic breast: Benign fibrocystic change with usual ductal hyperplasia, sclerosing adenosis, benign radial sclerosing lesion, and microcalcifications in benign ducts and lobules. TNM: mpT2, pN1a, pMX. Comments: none  RADIOGRAPHIC STUDIES:  Mr Lodema Pilot Contrast  03/23/2012  *RADIOLOGY REPORT*  Clinical Data: New diagnosis breast cancer.  Rule out metastatic disease.  MRI HEAD WITHOUT AND WITH CONTRAST  Technique:  Multiplanar, multiecho pulse sequences of the brain and surrounding structures were obtained according to standard protocol without and with intravenous contrast  Contrast: 12mL MULTIHANCE GADOBENATE DIMEGLUMINE 529 MG/ML IV SOLN  Comparison: None.  Findings: Ventricle size is normal.  Small 4 mm hyperintensities in the cerebral white matter bilaterally do not enhance and are most likely due to chronic microvascular ischemia.  Negative for acute infarct.  Diffusion weighted imaging is normal.  Pituitary is normal in size.  Negative for intracranial hemorrhage.  Following contrast infusion, no enhancing lesions are identified.  Paranasal sinuses are  clear.  Vessels at the base of the brain are patent.  IMPRESSION: Negative for metastatic disease.  No acute abnormality.   Original Report Authenticated By: Janeece Riggers, M.D.    Mr Breast Bilateral W Wo Contrast  03/12/2012  *RADIOLOGY REPORT*  Clinical Data: Recently diagnosed invasive mammary carcinoma in the 10 o'clock region of the right breast with a metastatic right axillary lymph node.  BILATERAL BREAST MRI WITH AND WITHOUT CONTRAST  Technique: Multiplanar, multisequence MR images of both breasts were obtained prior to and following the intravenous administration of 12ml of multihance.  Three dimensional images were evaluated at the independent DynaCad workstation.  Comparison:  Mammograms dated 03/05/2012, 03/01/2012, 03/01/2011 and 02/26/2010 from [facility].  Findings: There is a moderate background parenchymal enhancement pattern.  Right breast: 1.  There is a lobulated 2.2 x 1.7 x 1.8 cm mass in the posterior third of the upper outer quadrant of the right breast.  There is a post biopsy clip artifact 1 cm lateral to the mass. 2.  In the anterior third upper outer quadrant of the right breast there is patchy non mass like enhancement measuring 0.8 x 0.3 x 0.3 cm.  Left breast:  There is no abnormal enhancement the left breast.  There are enlarged right axillary lymph nodes.  The largest right axillary lymph node measures 2.4 cm and is located lateral to the pectoralis minor consistent with level I adenopathy.  There is a 1.6 cm lymph node behind the pectoralis minor consistent with enlarged level II adenopathy.  IMPRESSION:  1.  2.2 cm mass in the posterior third of the upper outer quadrant of the right breast corresponding with the known invasive mammary carcinoma. 2.  Patchy non mass-like enhancement in the anterior third of the upper outer quadrant of the right breast.  MR guided core biopsy is recommended. 3. Enlarged level I and II right axillary adenopathy.  RECOMMENDATION: MR guided  core biopsy of the right breast.  THREE-DIMENSIONAL MR IMAGE RENDERING ON INDEPENDENT WORKSTATION:  Three-dimensional MR images were rendered by post-processing of the original MR data on an independent workstation.  The three- dimensional MR images were interpreted, and findings were reported in the accompanying complete MRI report for this study.  BI-RADS CATEGORY 4:  Suspicious abnormality - biopsy should be considered.   Original Report Authenticated By: Baird Lyons, M.D.    Mr Biopsy/wire Localization  03/20/2012  *  RADIOLOGY REPORT*  Clinical Data:  Known right breast carcinoma.  Additional area of worrisome enhancement located within the anterior upper outer quadrant of the right breast.  MRI GUIDED VACUUM ASSISTED BIOPSY OF THE RIGHT BREAST WITHOUT AND WITH CONTRAST  Comparison: Previous exams.  Technique: Multiplanar, multisequence MR images of the right breast were obtained prior to and following the intravenous administration of 12 ml of Mulithance.  I met with the patient, and we discussed the procedure of MRI guided biopsy, including risks, benefits, and alternatives. Specifically, we discussed the risks of infection, bleeding, tissue injury, clip migration, and inadequate sampling.  Informed, written consent was given.  Using sterile technique, 2% Lidocaine, MRI guidance, and a 9 gauge vacuum assisted device, biopsy was performed of the area of enhancement located within the anterior portion of the upper-outer quadrant - right breast using a lateral approach.  At the conclusion of the procedure, a  tissue marker clip was deployed into the biopsy cavity.  IMPRESSION: MRI guided biopsy of the area of enhancement located within the anterior portion of the upper-outer quadrant of the right breast as discussed above. No apparent complications.  BI-RADS CATEGORY 6:  Known biopsy-proven malignancy - appropriate action should be taken.  THREE-DIMENSIONAL MR IMAGE RENDERING ON INDEPENDENT WORKSTATION:   Three-dimensional MR images were rendered by post-processing of the original MR data on an independent workstation.  The three- dimensional MR images were interpreted, and findings were reported in the accompanying complete MRI report for this study.  The pathology associated with the right breast MR guided core biopsy demonstrated invasive mammary carcinoma and in situ mammary carcinoma.  This is concordant with imaging findings.  I have discussed findings with the patient by telephone and answered her questions.  The patient states that biopsy site is clean and dry without hematoma formation or signs of infection.  Post biopsy wound care instructions were reviewed with the patient.  Follow-up will be with Dr. Carolynne Edouard and Dr. Welton Flakes regards her treatment plan.  The patient was encouraged to call the Breast Center for additional questions or concerns   Original Report Authenticated By: Rolla Plate, M.D.    Nm Pet Image Initial (pi) Skull Base To Thigh  03/22/2012  *RADIOLOGY REPORT*  Clinical Data: Initial treatment strategy for high risk right breast cancer.  NUCLEAR MEDICINE PET SKULL BASE TO THIGH  Fasting Blood Glucose:  100  Technique:  18.6 mCi F-18 FDG was injected intravenously. CT data was obtained and used for attenuation correction and anatomic localization only.  (This was not acquired as a diagnostic CT examination.) Additional exam technical data entered on technologist worksheet.  Comparison:  MRI breast dated 03/12/2012  Findings:  Neck: No hypermetabolic lymph nodes in the neck.  Chest:  Vague hypermetabolism in the upper/central right breast, max SUV 2.7.  Additional focal hypermetabolism in the upper/outer right breast, max SUV 2.3 (PET image 105).  These findings correspond to biopsy-proven primary breast carcinoma.  1.3 cm short axis right subpectoral lymph node (series 2/image 66), max SUV 3.1.  Right axillary lymphadenopathy measuring up to 1.6 cm short axis (series 2/image 88), max SUV  4.8).  No hypermetabolic mediastinal or hilar nodes.  No suspicious pulmonary nodules on the CT scan.  Abdomen/Pelvis:  No abnormal hypermetabolic activity within the liver, pancreas, adrenal glands, or spleen.  No hypermetabolic lymph nodes in the abdomen or pelvis.  Skeleton:  No focal hypermetabolic activity to suggest skeletal metastasis.  IMPRESSION: Two foci of hypermetabolism in the upper right  breast, max SUV 2.7, corresponding to biopsy-proven primary breast carcinoma.  Associated right subpectoral and axillary nodal metastases, max SUV 4.8.  No evidence of distant metastases.   Original Report Authenticated By: Charline Bills, M.D.    Mm Digital Diagnostic Unilat R  03/20/2012  *RADIOLOGY REPORT*  Clinical Data:  Post right breast MR guided core biopsy.  DIGITAL DIAGNOSTIC RIGHT BREAST MAMMOGRAM  Comparison:  03/05/2012, 03/01/2012, 03/01/2011, 02/26/2010 mammograms from [facility].  Findings:  Films are performed following MR guided biopsy of the anterior portion of the upper-outer quadrant of the right breast. The hourglass shaped clip placed during the MR guided core biopsy appears in appropriate position.  IMPRESSION: Appropriate positioning of clip following right breast MR guided core biopsy.   Original Report Authenticated By: Rolla Plate, M.D.     ASSESSMENT: 67 year old [place], [place] woman  #1  status post mastectomy with right axillary lymph node dissection 05/10/2012 for an mpT2 N1, stage stage IIB invasive lobular breast cancer, stage IIB, ER positive, HER-2/neu positive, with an Mib-1 between 5% and 44%  #2 Patient underwent adjuvant chemotherapy consisting of Taxotere, carboplatinum and Herceptin given every 21 days for total of 6 cycles of Taxotere carboplatinum and weekly Herceptin beginning on 06/14/12.  Adjuvant every three week herceptin began on 10/04/12, and patient received radiation therapy from on 10/25/12 through 12/11/12.      #3 Herceptin to be continued to total  one year; most recent echo 03/05/13.  Started on Letrozole 02/22/13.  PLAN:   1. Ms. Severs is doing well. She will proceed with Herceptin.  She will continue taking Letrozole daily.   2. She will continue to take Super B complex daily for neuropathy. She declined Neurontin therapy.    3. Ms. Salatino will return in three weeks for her next Herceptin therapy.    4. Ms. Abend is doing well with Lasix PRN.  She will return to Dr. Gala Romney in February, 2015.  All questions were answered. The patient knows to call the clinic with any problems, questions or concerns. We can certainly see the patient much sooner if necessary.   I spent 25 minutes counseling the patient face to face. The total time spent in the appointment was 30 minutes.  Illa Level, NP Medical Oncology [facility] [phone] 03/21/2013, 12:51 PM  ATTENDING'S ATTESTATION:  I personally reviewed patient's chart, examined patient myself, formulated the treatment plan as followed.   Ms. Whittenberg is here for her Herceptin. She is also receiving letrozole 2.5 mg daily. She does have neuropathy recommended starting on Neurontin but declined. I do think she will need this eventually do to the neuropathic pains but she developed form Taxotere.  She'll be seen back in 3 weeks' time for next dose of Herceptin  Drue Second, MD Medical/Oncology [facility] [phone] (beeper) [phone] (Office)  04/11/2013, 7:02 PM

## 2013-03-21 NOTE — Patient Instructions (Signed)
[facility] Discharge Instructions for Patients Receiving Chemotherapy  Today you received the following chemotherapy agents :  Herceptin.  To help prevent nausea and vomiting after your treatment, we encourage you to take your nausea medication as instructed by your physician.   If you develop nausea and vomiting that is not controlled by your nausea medication, call the clinic.   BELOW ARE SYMPTOMS THAT SHOULD BE REPORTED IMMEDIATELY:  *FEVER GREATER THAN 100.5 F  *CHILLS WITH OR WITHOUT FEVER  NAUSEA AND VOMITING THAT IS NOT CONTROLLED WITH YOUR NAUSEA MEDICATION  *UNUSUAL SHORTNESS OF BREATH  *UNUSUAL BRUISING OR BLEEDING  TENDERNESS IN MOUTH AND THROAT WITH OR WITHOUT PRESENCE OF ULCERS  *URINARY PROBLEMS  *BOWEL PROBLEMS  UNUSUAL RASH Items with * indicate a potential emergency and should be followed up as soon as possible.  Feel free to call the clinic you have any questions or concerns. The clinic phone number is [phone].    

## 2013-03-28 ENCOUNTER — Encounter (INDEPENDENT_AMBULATORY_CARE_PROVIDER_SITE_OTHER): Payer: Self-pay | Admitting: General Surgery

## 2013-03-28 ENCOUNTER — Ambulatory Visit (INDEPENDENT_AMBULATORY_CARE_PROVIDER_SITE_OTHER): Payer: Medicare Other | Admitting: General Surgery

## 2013-03-28 VITALS — BP 144/78 | HR 68 | Temp 98.4°F | Resp 14 | Ht 64.0 in | Wt 137.6 lb

## 2013-03-28 DIAGNOSIS — C50411 Malignant neoplasm of upper-outer quadrant of right female breast: Secondary | ICD-10-CM

## 2013-03-28 DIAGNOSIS — C50419 Malignant neoplasm of upper-outer quadrant of unspecified female breast: Secondary | ICD-10-CM

## 2013-03-28 NOTE — Patient Instructions (Signed)
Will get u/s of right chest wall mass

## 2013-03-29 ENCOUNTER — Telehealth (INDEPENDENT_AMBULATORY_CARE_PROVIDER_SITE_OTHER): Payer: Self-pay | Admitting: *Deleted

## 2013-03-29 NOTE — Telephone Encounter (Signed)
LMOM for pt to return my call.  I was calling pt to inform her of appt at [facility] for [place] on 04/02/13 with an arrival time of 8:15am.

## 2013-03-29 NOTE — Telephone Encounter (Signed)
Pt returned my call and I made her aware of appt information below.

## 2013-04-01 NOTE — Progress Notes (Signed)
Subjective:     Patient ID: Heather [name], female   DOB: 03-17-1946, 67 y.o.   MRN: [id number]  HPI The patient is a 67 year old white female who is one year status post right modified radical mastectomy for a T2 N1 right breast cancer. She was ER positive and HER-2 positive. She finished chemotherapy and radiation therapy. She is now taking letrozole and tolerating that well.  her only complaint is of numbness in her feet which she does attribute to the letrozole. She also notes a small nodularity on her right chest wall medially  Review of Systems  Constitutional: Negative.   HENT: Negative.   Eyes: Negative.   Respiratory: Negative.   Cardiovascular: Negative.   Gastrointestinal: Negative.   Endocrine: Negative.   Genitourinary: Negative.   Musculoskeletal: Negative.   Skin: Negative.   Allergic/Immunologic: Negative.   Neurological: Positive for numbness.  Hematological: Negative.   Psychiatric/Behavioral: Negative.        Objective:   Physical Exam  Constitutional: She is oriented to person, place, and time. She appears well-developed and well-nourished.  HENT:  Head: Normocephalic and atraumatic.  Eyes: Conjunctivae and EOM are normal. Pupils are equal, round, and reactive to light.  Neck: Normal range of motion. Neck supple.  Cardiovascular: Normal rate, regular rhythm and normal heart sounds.   Pulmonary/Chest: Effort normal and breath sounds normal.  Her right mastectomy incision is healed nicely. There is one small mobile round nodularity measuring about 3 or 4 mm in diameter along the medial chest wall near the incision. It seems to be in the subcutaneous tissue. There is no palpable mass of the left breast. There is no palpable axillary, supraclavicular, or cervical lymphadenopathy.  Abdominal: Soft. Bowel sounds are normal. She exhibits no mass. There is no tenderness.  Musculoskeletal: Normal range of motion.  Lymphadenopathy:    She has no cervical adenopathy.   Neurological: She is alert and oriented to person, place, and time.  Skin: Skin is warm and dry.  Psychiatric: She has a normal mood and affect. Her behavior is normal.       Assessment:     The patient is one year status post right modified radical mastectomy for breast cancer     Plan:     At this point she will continue to do regular self exams. She will continue to take letrozole. I will evaluate the nodule of the right chest wall with ultrasound. If this looks like an area of fat necrosis than I will plan to reexamine her in 3 months. If it has a worrisome appearance At all then it will need to be excised.

## 2013-04-11 ENCOUNTER — Telehealth: Payer: Self-pay | Admitting: *Deleted

## 2013-04-11 ENCOUNTER — Ambulatory Visit (HOSPITAL_BASED_OUTPATIENT_CLINIC_OR_DEPARTMENT_OTHER): Payer: Medicare Other | Admitting: Adult Health

## 2013-04-11 ENCOUNTER — Ambulatory Visit (HOSPITAL_BASED_OUTPATIENT_CLINIC_OR_DEPARTMENT_OTHER): Payer: Medicare Other

## 2013-04-11 ENCOUNTER — Other Ambulatory Visit (HOSPITAL_BASED_OUTPATIENT_CLINIC_OR_DEPARTMENT_OTHER): Payer: Medicare Other | Admitting: Lab

## 2013-04-11 ENCOUNTER — Encounter: Payer: Self-pay | Admitting: Adult Health

## 2013-04-11 VITALS — BP 141/84 | HR 69 | Temp 98.0°F | Resp 18 | Ht 64.0 in | Wt 136.7 lb

## 2013-04-11 DIAGNOSIS — C50411 Malignant neoplasm of upper-outer quadrant of right female breast: Secondary | ICD-10-CM

## 2013-04-11 DIAGNOSIS — C773 Secondary and unspecified malignant neoplasm of axilla and upper limb lymph nodes: Secondary | ICD-10-CM

## 2013-04-11 DIAGNOSIS — C50419 Malignant neoplasm of upper-outer quadrant of unspecified female breast: Secondary | ICD-10-CM

## 2013-04-11 DIAGNOSIS — Z5112 Encounter for antineoplastic immunotherapy: Secondary | ICD-10-CM

## 2013-04-11 DIAGNOSIS — C50412 Malignant neoplasm of upper-outer quadrant of left female breast: Secondary | ICD-10-CM

## 2013-04-11 LAB — COMPREHENSIVE METABOLIC PANEL (CC13)
ALT: 16 U/L (ref 0–55)
AST: 23 U/L (ref 5–34)
Albumin: 4.2 g/dL (ref 3.5–5.0)
Alkaline Phosphatase: 79 U/L (ref 40–150)
Anion Gap: 10 mEq/L (ref 3–11)
BUN: 14.6 mg/dL (ref 7.0–26.0)
CO2: 27 mEq/L (ref 22–29)
Calcium: 10.2 mg/dL (ref 8.4–10.4)
Chloride: 101 mEq/L (ref 98–109)
Creatinine: 0.9 mg/dL (ref 0.6–1.1)
Glucose: 85 mg/dl (ref 70–140)
Potassium: 4.1 mEq/L (ref 3.5–5.1)
Sodium: 138 mEq/L (ref 136–145)
Total Bilirubin: 0.65 mg/dL (ref 0.20–1.20)
Total Protein: 7.5 g/dL (ref 6.4–8.3)

## 2013-04-11 LAB — CBC WITH DIFFERENTIAL/PLATELET
BASO%: 1 % (ref 0.0–2.0)
Basophils Absolute: 0 10*3/uL (ref 0.0–0.1)
EOS%: 2.6 % (ref 0.0–7.0)
Eosinophils Absolute: 0.1 10*3/uL (ref 0.0–0.5)
HCT: 39.4 % (ref 34.8–46.6)
HGB: 13.1 g/dL (ref 11.6–15.9)
LYMPH%: 23.8 % (ref 14.0–49.7)
MCH: 29.1 pg (ref 25.1–34.0)
MCHC: 33.2 g/dL (ref 31.5–36.0)
MCV: 87.6 fL (ref 79.5–101.0)
MONO#: 0.3 10*3/uL (ref 0.1–0.9)
MONO%: 8.5 % (ref 0.0–14.0)
NEUT#: 2 10*3/uL (ref 1.5–6.5)
NEUT%: 64.1 % (ref 38.4–76.8)
Platelets: 149 10*3/uL (ref 145–400)
RBC: 4.5 10*6/uL (ref 3.70–5.45)
RDW: 13.1 % (ref 11.2–14.5)
WBC: 3.1 10*3/uL — ABNORMAL LOW (ref 3.9–10.3)
lymph#: 0.7 10*3/uL — ABNORMAL LOW (ref 0.9–3.3)

## 2013-04-11 MED ORDER — ACETAMINOPHEN 325 MG PO TABS
ORAL_TABLET | ORAL | Status: AC
  Filled 2013-04-11: qty 2

## 2013-04-11 MED ORDER — HEPARIN SOD (PORK) LOCK FLUSH 100 UNIT/ML IV SOLN
500.0000 [IU] | Freq: Once | INTRAVENOUS | Status: AC | PRN
  Administered 2013-04-11: 500 [IU]
  Filled 2013-04-11: qty 5

## 2013-04-11 MED ORDER — SODIUM CHLORIDE 0.9 % IJ SOLN
10.0000 mL | INTRAMUSCULAR | Status: DC | PRN
  Administered 2013-04-11: 10 mL
  Filled 2013-04-11: qty 10

## 2013-04-11 MED ORDER — DIPHENHYDRAMINE HCL 25 MG PO CAPS: 25.0000 mg | ORAL_CAPSULE | Freq: Once | ORAL | Status: DC

## 2013-04-11 MED ORDER — ACETAMINOPHEN 325 MG PO TABS
650.0000 mg | ORAL_TABLET | Freq: Once | ORAL | Status: AC
  Administered 2013-04-11: 650 mg via ORAL

## 2013-04-11 MED ORDER — TRASTUZUMAB CHEMO INJECTION 440 MG
6.0000 mg/kg | Freq: Once | INTRAVENOUS | Status: AC
  Administered 2013-04-11: 378 mg via INTRAVENOUS
  Filled 2013-04-11: qty 18

## 2013-04-11 MED ORDER — SODIUM CHLORIDE 0.9 % IV SOLN
Freq: Once | INTRAVENOUS | Status: AC
  Administered 2013-04-11: 10:00:00 via INTRAVENOUS

## 2013-04-11 NOTE — Telephone Encounter (Signed)
Per staff message and POF I have scheduled appts.  JMW  

## 2013-04-11 NOTE — Patient Instructions (Signed)
[facility] Discharge Instructions for Patients Receiving Chemotherapy  Today you received the following chemotherapy agents Herceptin  To help prevent nausea and vomiting after your treatment, we encourage you to take your nausea medication     If you develop nausea and vomiting that is not controlled by your nausea medication, call the clinic.   BELOW ARE SYMPTOMS THAT SHOULD BE REPORTED IMMEDIATELY:  *FEVER GREATER THAN 100.5 F  *CHILLS WITH OR WITHOUT FEVER  NAUSEA AND VOMITING THAT IS NOT CONTROLLED WITH YOUR NAUSEA MEDICATION  *UNUSUAL SHORTNESS OF BREATH  *UNUSUAL BRUISING OR BLEEDING  TENDERNESS IN MOUTH AND THROAT WITH OR WITHOUT PRESENCE OF ULCERS  *URINARY PROBLEMS  *BOWEL PROBLEMS  UNUSUAL RASH Items with * indicate a potential emergency and should be followed up as soon as possible.  Feel free to call the clinic you have any questions or concerns. The clinic phone number is [phone].    

## 2013-04-11 NOTE — Progress Notes (Addendum)
OFFICE PROGRESS NOTE   Heather Bradford, MD [address] [place] [address] Dr. Chevis Pretty Dr. Chipper Herb  DIAGNOSIS: 67 year old female with invasive lobular breast cancer, stage IIB, receiving adjuvant chemotherapy  PRIOR THERAPY:  #1 patient was originally seen in the multidisciplinary breast clinic on 03/14/2012 for new diagnosis of invasive ductal carcinoma that was HER-2 positive node-positive, ER positive PR negative with Ki-67 of 44%  #2 on MRI patient also had an anterior breast mass that was to be biopsied to decide whether or not she should receive neoadjuvant chemotherapy or adjuvant chemotherapy. Patient's mass was biopsied and it was positive for a malignancy that was HER-2/neu negative.  #3 patient is now status post right mastectomy with axillary lymph node dissection with the final pathology revealing the largest tumor measuring 5 cm, 1.7 cm and 1.6 cm. Tumor was ER positive PR negative HER-2/neu positive with a Ki-67 of 44% 3 of 7 lymph nodes were positive for metastatic disease.  #4 patient will proceed with adjuvant chemotherapy and Herceptin. Chemotherapy will consist of Taxotere carboplatinum given every 21 days with weekly Herceptin. Total of 6 cycles of Taxotere carboplatin will be administered starting on 06/14/2012. She began adjuvant every 3 week herceptin on 5/29.  Because patient's tumor is ER positive she will also be given antiestrogen therapy adjuvantly.  #5 Radiation therapy beginning 10/25/12, finished on 12/11/12.    #6 Patient started on Letrozole on 02/07/2013.  CURRENT THERAPY: Every 3 week Herceptin, Letrozole daily  INTERVAL HISTORY: Heather [name] is here for f/u prior to her adjuvant Herceptin. She's doing well today. She continues to tolerate the herceptin and Letrozole without difficulty. She saw Dr. Gala Romney on 10/28, was evaluated by echocardiogram and cleared to continue Herceptin therapy.  She will f/u with him in February 2015.   She is doing well and is slightly fatigued, and also has a sensation of pinpricks when she is active and her body temperature is elevated.  She describes these as intermittent, and states they feel like hot flashes that she has had previously.  This is not bothering her.  Otherwise, she is well and a 10 point ROS is neg.   MEDICAL HISTORY: Past Medical History  Diagnosis Date  . Chronic kidney disease   . Hx: UTI (urinary tract infection)   . Right shoulder pain   . Right knee pain   . Hypertension     recently started Aldactone   . Hyperlipidemia     but not on meds;diet and exercise controlled  . Dizziness     has been going on for 61months;medical MD aware  . H/O hiatal hernia   . GERD (gastroesophageal reflux disease)     Tums prn  . Hemorrhoids   . Urinary frequency     d/t taking Aldactone  . History of UTI   . Anemia     hx of  . Cataract     immature-not sure of which eye  . Anxiety     r/t updated surgery  . Insomnia     takes Melatoniin daily  . Complication of anesthesia     pt states she is sensitive to meds  . Breast cancer     right  . Skin cancer   . Hx of radiation therapy 10/25/12- 12/11/12    right chest wall/regional lymph nodes 5040 cGy, 28 sessions, right chest wall boost 1000 cGy 1 session    ALLERGIES:  is allergic to codeine; keflex; naprosyn; and  tussin.  MEDICATIONS:  Current Outpatient Prescriptions  Medication Sig Dispense Refill  . B Complex-C (SUPER B COMPLEX PO) Take 1 each by mouth daily.      . calcium carbonate (TUMS - DOSED IN MG ELEMENTAL CALCIUM) 500 MG chewable tablet Chew 1 tablet by mouth daily.      . Cholecalciferol (VITAMIN D-3 PO) Take 4,000 Units by mouth daily.      Marland Kitchen letrozole (FEMARA) 2.5 MG tablet Take 1 tablet (2.5 mg total) by mouth daily.  30 tablet  2  . lidocaine-prilocaine (EMLA) cream Apply topically as needed.  30 g  7  . magnesium gluconate (MAGONATE) 500 MG tablet Take 500 mg by mouth 2 (two) times daily.      Marland Kitchen  omeprazole (PRILOSEC) 40 MG capsule Take 1 capsule (40 mg total) by mouth daily.  30 capsule  6  . Probiotic Product (PROBIOTIC DAILY PO) Take 1 each by mouth.      . spironolactone (ALDACTONE) 25 MG tablet TAKE ONE-HALF TABLET (12.5MG ) BY MOUTH EVERY DAY  30 tablet  3  . UNABLE TO FIND Rx: L8000- Post Surgical Bra (Quantity: 6) L8030- Silicone Breast Prosthesis (Quantity: 1) Dx: 174.9; Right mastectomy  1 each  0  . clindamycin (CLINDAGEL) 1 % gel Apply topically 2 (two) times daily.  30 g  0  . furosemide (LASIX) 20 MG tablet Take 1 tablet (20 mg total) by mouth daily as needed.  30 tablet  3  . potassium chloride SA (K-DUR,KLOR-CON) 20 MEQ tablet Take 1 tablet (20 mEq total) by mouth daily as needed. When you take Furosemide  90 tablet  3   No current facility-administered medications for this visit.    SURGICAL HISTORY:  Past Surgical History  Procedure Laterality Date  . Cyst removed from left breast  1970  . Left wrist surgery   2004    with plate  . Colonoscopy    . Esophagogastroduodenoscopy    . Mastectomy, radical    . Mastectomy modified radical  05/10/2012    Procedure: MASTECTOMY MODIFIED RADICAL;  Surgeon: Robyne Askew, MD;  Location: [facility] OR;  Service: General;  Laterality: Right;  RIGHT MODIFIED RADICAL MASTECTOMY  . Portacath placement  05/10/2012    Procedure: INSERTION PORT-A-CATH;  Surgeon: Robyne Askew, MD;  Location: [facility] OR;  Service: General;  Laterality: Left;    REVIEW OF SYSTEMS:   A 10 point review of systems was conducted and is otherwise negative except for what is noted above.    Health Maintenance  Mammogram: 02/2013  Colonoscopy: 2 years ago Bone Density Scan: 01/03/13 Pap Smear: 10/2011 Eye Exam: 02/12/2013 Vitamin D Level: 01/17/13 Lipid Panel:01/17/13   PHYSICAL EXAMINATION: Blood pressure 141/84, pulse 69, temperature 98 F (36.7 C), temperature source Oral, resp. rate 18, height 5\' 4"  (1.626 m), weight 136 lb 11.2 oz (62.007 kg). Body mass  index is 23.45 kg/(m^2). General: Patient is a well appearing female in no acute distress HEENT: PERRLA, sclerae anicteric no conjunctival pallor, MMM Neck: supple, no palpable adenopathy Lungs: clear to auscultation bilaterally, no wheezes, rhonchi, or rales Cardiovascular: regular rate rhythm, S1, S2, no murmurs, rubs or gallops Abdomen: Soft, non-tender, non-distended, normoactive bowel sounds, no HSM Extremities: warm and well perfused, no clubbing, cyanosis, or edema Skin: No rashes or lesions Neuro: Non-focal Breasts: deferred ECOG PERFORMANCE STATUS: 1   LABORATORY DATA: Lab Results  Component Value Date   WBC 3.1* 04/11/2013   HGB 13.1 04/11/2013  HCT 39.4 04/11/2013   MCV 87.6 04/11/2013   PLT 149 04/11/2013      Chemistry      Component Value Date/Time   NA 139 03/21/2013 0828   NA 138 05/11/2012 0640   K 4.1 03/21/2013 0828   K 4.7 05/11/2012 0640   CL 101 10/25/2012 0946   CL 99 05/11/2012 0640   CO2 25 03/21/2013 0828   CO2 20 05/11/2012 0640   BUN 21.3 03/21/2013 0828   BUN 12 05/11/2012 0640   CREATININE 0.9 03/21/2013 0828   CREATININE 0.88 05/11/2012 0640      Component Value Date/Time   CALCIUM 10.0 03/21/2013 0828   CALCIUM 9.7 05/11/2012 0640   ALKPHOS 82 03/21/2013 0828   AST 21 03/21/2013 0828   ALT 15 03/21/2013 0828   BILITOT 0.42 03/21/2013 0828     ADDITIONAL INFORMATION: CHROMOGENIC IN-SITU HYBRIDIZATION (1C - TUMOR WITH CLIP) Interpretation HER-2/NEU BY CISH - NO AMPLIFICATION OF HER-2 DETECTED. THE RATIO OF HER-2: CEP 17 SIGNALS WAS 1.25. Reference range: Ratio: HER2:CEP17 < 1.8 - gene amplification not observed Ratio: HER2:CEP 17 1.8-2.2 - equivocal result Ratio: HER2:CEP17 > 2.2 - gene amplification observed CHROMOGENIC IN-SITU HYBRIDIZATION (1E - TUMOR WITHOUT CLIP) Interpretation: HER2/NEU BY CISH - SHOWS AMPLIFICATION BY CISH ANALYSIS. THE RATIO OF HER2: CEP 17 SIGNALS WAS 2.83. Reference range: Ratio: HER2:CEP17 < 1.8 gene amplification  not observed Ratio: HER2:CEP 17 1.8-2.2 - equivocal result Ratio: HER2:CEP17 > 2.2 - gene amplification observed Pecola Leisure MD Pathologist, Electronic Signature ( Signed 05/15/2012) FINAL DIAGNOSIS Diagnosis Breast, modified radical mastectomy , right, with axillary contents 1 of 4 FINAL for Heather, [name] S ([id number]) Diagnosis(continued) TUMOR #1 (WITH CLIP): - INVASIVE LOBULAR CARCINOMA, GRADE II, SEE COMMENT. - LYMPHOVASCULAR INVASION IDENTIFIED. - PREVIOUS BIOPSY SITE IDENTIFIED. TUMOR #2 (NO CLIP): - INVASIVE LOBULAR CARCINOMA, GRADE II, SEE COMMENT. - LOBULAR CARCINOMA IN SITU. - LYMPHOVASCULAR INVASION IDENTIFIED. - THREE LYMPH NODES, POSITIVE FOR METASTATIC MAMMARY CARCINOMA (3/7). - TUMOR DEPOSITS ARE 1.7, 1.6, AND 5 CM. - EXTRACAPSULAR TUMOR EXTENSION IDENTIFIED. - TWO LYMPH NODES, POSITIVE FOR ISOLATED TUMOR CELLS. Microscopic Comment BREAST, INVASIVE TUMOR, WITH LYMPH NODE SAMPLING Specimen, including laterality: Right breast Procedure: Modified radical mastectomy Grade: Tumor 1 and tumor 2: Grade II of III Tubule formation: 3 Nuclear pleomorphism: 2 Mitotic: 1 Tumor size (gross measurement: Tumor #1- 2.3 cm; tumor #2 - 2.4 cm Margins: Invasive, distance to closest margin: Tumor #1 2.7 cm - deep margin; tumor #2 - 1.6 cm - deep margin Lymphovascular invasion: Present Ductal carcinoma in situ: Absent Grade: N/A Extensive intraductal component: N/A Lobular neoplasia: Both atypical hyperplasia and in situ carcinoma Extent of tumor: Skin: Grossly negative Nipple: Grossly negative Skeletal muscle: Microscopically negative Lymph nodes: # examined: 7 Lymph nodes with metastasis: 3 Isolated tumor cells (< 0.2 mm): two lymph nodes (Slide 1P and 1O) Micrometastasis: (> 0.2 mm and < 2.0 mm): none Macrometastasis: (> 2.0 mm): 1.7 cm , 1.6 cm and 5 cm Extracapsular extension: Present Breast prognostic profile: Tumor #1: Estrogen receptor: Not repeated, previous  study demonstrated 98% positivity ([id number]) Progesterone receptor: Not repeated, previous study demonstrated 0% positivity ([id number]) Her 2 neu: Repeated, previous study demonstrated no amplification (1.38) ([id number]) Ki-67: Not repeated, previous study demonstrated 5% proliferation rate ([id number]) Tumor #2: Estrogen receptor: Not repeated, previous study demonstrated 97% positivity ([id number]) Progesterone receptor: Not repeated, previous study demonstrated 0% positivity ([id number]) Her 2 neu: Repeated, previous study demonstrated amplification (3.65) ([id number]) Ki-67: Not repeated, previous study demonstrated 44%  proliferation rate ([id number]) 2 of 4 FINAL for JUDIA, ARNOTT (SZA14-4) Microscopic Comment(continued) Non-neoplastic breast: Benign fibrocystic change with usual ductal hyperplasia, sclerosing adenosis, benign radial sclerosing lesion, and microcalcifications in benign ducts and lobules. TNM: mpT2, pN1a, pMX. Comments: none  RADIOGRAPHIC STUDIES:  Mr Lodema Pilot Contrast  03/23/2012  *RADIOLOGY REPORT*  Clinical Data: New diagnosis breast cancer.  Rule out metastatic disease.  MRI HEAD WITHOUT AND WITH CONTRAST  Technique:  Multiplanar, multiecho pulse sequences of the brain and surrounding structures were obtained according to standard protocol without and with intravenous contrast  Contrast: 12mL MULTIHANCE GADOBENATE DIMEGLUMINE 529 MG/ML IV SOLN  Comparison: None.  Findings: Ventricle size is normal.  Small 4 mm hyperintensities in the cerebral white matter bilaterally do not enhance and are most likely due to chronic microvascular ischemia.  Negative for acute infarct.  Diffusion weighted imaging is normal.  Pituitary is normal in size.  Negative for intracranial hemorrhage.  Following contrast infusion, no enhancing lesions are identified.  Paranasal sinuses are clear.  Vessels at the base of the brain are patent.  IMPRESSION: Negative for metastatic  disease.  No acute abnormality.   Original Report Authenticated By: Janeece Riggers, M.D.    Mr Breast Bilateral W Wo Contrast  03/12/2012  *RADIOLOGY REPORT*  Clinical Data: Recently diagnosed invasive mammary carcinoma in the 10 o'clock region of the right breast with a metastatic right axillary lymph node.  BILATERAL BREAST MRI WITH AND WITHOUT CONTRAST  Technique: Multiplanar, multisequence MR images of both breasts were obtained prior to and following the intravenous administration of 12ml of multihance.  Three dimensional images were evaluated at the independent DynaCad workstation.  Comparison:  Mammograms dated 03/05/2012, 03/01/2012, 03/01/2011 and 02/26/2010 from [facility].  Findings: There is a moderate background parenchymal enhancement pattern.  Right breast: 1.  There is a lobulated 2.2 x 1.7 x 1.8 cm mass in the posterior third of the upper outer quadrant of the right breast.  There is a post biopsy clip artifact 1 cm lateral to the mass. 2.  In the anterior third upper outer quadrant of the right breast there is patchy non mass like enhancement measuring 0.8 x 0.3 x 0.3 cm.  Left breast:  There is no abnormal enhancement the left breast.  There are enlarged right axillary lymph nodes.  The largest right axillary lymph node measures 2.4 cm and is located lateral to the pectoralis minor consistent with level I adenopathy.  There is a 1.6 cm lymph node behind the pectoralis minor consistent with enlarged level II adenopathy.  IMPRESSION:  1.  2.2 cm mass in the posterior third of the upper outer quadrant of the right breast corresponding with the known invasive mammary carcinoma. 2.  Patchy non mass-like enhancement in the anterior third of the upper outer quadrant of the right breast.  MR guided core biopsy is recommended. 3. Enlarged level I and II right axillary adenopathy.  RECOMMENDATION: MR guided core biopsy of the right breast.  THREE-DIMENSIONAL MR IMAGE RENDERING ON INDEPENDENT  WORKSTATION:  Three-dimensional MR images were rendered by post-processing of the original MR data on an independent workstation.  The three- dimensional MR images were interpreted, and findings were reported in the accompanying complete MRI report for this study.  BI-RADS CATEGORY 4:  Suspicious abnormality - biopsy should be considered.   Original Report Authenticated By: Baird Lyons, M.D.    Mr Biopsy/wire Localization  03/20/2012  *RADIOLOGY REPORT*  Clinical Data:  Known right breast  carcinoma.  Additional area of worrisome enhancement located within the anterior upper outer quadrant of the right breast.  MRI GUIDED VACUUM ASSISTED BIOPSY OF THE RIGHT BREAST WITHOUT AND WITH CONTRAST  Comparison: Previous exams.  Technique: Multiplanar, multisequence MR images of the right breast were obtained prior to and following the intravenous administration of 12 ml of Mulithance.  I met with the patient, and we discussed the procedure of MRI guided biopsy, including risks, benefits, and alternatives. Specifically, we discussed the risks of infection, bleeding, tissue injury, clip migration, and inadequate sampling.  Informed, written consent was given.  Using sterile technique, 2% Lidocaine, MRI guidance, and a 9 gauge vacuum assisted device, biopsy was performed of the area of enhancement located within the anterior portion of the upper-outer quadrant - right breast using a lateral approach.  At the conclusion of the procedure, a  tissue marker clip was deployed into the biopsy cavity.  IMPRESSION: MRI guided biopsy of the area of enhancement located within the anterior portion of the upper-outer quadrant of the right breast as discussed above. No apparent complications.  BI-RADS CATEGORY 6:  Known biopsy-proven malignancy - appropriate action should be taken.  THREE-DIMENSIONAL MR IMAGE RENDERING ON INDEPENDENT WORKSTATION:  Three-dimensional MR images were rendered by post-processing of the original MR data on an  independent workstation.  The three- dimensional MR images were interpreted, and findings were reported in the accompanying complete MRI report for this study.  The pathology associated with the right breast MR guided core biopsy demonstrated invasive mammary carcinoma and in situ mammary carcinoma.  This is concordant with imaging findings.  I have discussed findings with the patient by telephone and answered her questions.  The patient states that biopsy site is clean and dry without hematoma formation or signs of infection.  Post biopsy wound care instructions were reviewed with the patient.  Follow-up will be with Dr. Carolynne Edouard and Dr. Welton Flakes regards her treatment plan.  The patient was encouraged to call the Breast Center for additional questions or concerns   Original Report Authenticated By: Rolla Plate, M.D.    Nm Pet Image Initial (pi) Skull Base To Thigh  03/22/2012  *RADIOLOGY REPORT*  Clinical Data: Initial treatment strategy for high risk right breast cancer.  NUCLEAR MEDICINE PET SKULL BASE TO THIGH  Fasting Blood Glucose:  100  Technique:  18.6 mCi F-18 FDG was injected intravenously. CT data was obtained and used for attenuation correction and anatomic localization only.  (This was not acquired as a diagnostic CT examination.) Additional exam technical data entered on technologist worksheet.  Comparison:  MRI breast dated 03/12/2012  Findings:  Neck: No hypermetabolic lymph nodes in the neck.  Chest:  Vague hypermetabolism in the upper/central right breast, max SUV 2.7.  Additional focal hypermetabolism in the upper/outer right breast, max SUV 2.3 (PET image 105).  These findings correspond to biopsy-proven primary breast carcinoma.  1.3 cm short axis right subpectoral lymph node (series 2/image 66), max SUV 3.1.  Right axillary lymphadenopathy measuring up to 1.6 cm short axis (series 2/image 88), max SUV 4.8).  No hypermetabolic mediastinal or hilar nodes.  No suspicious pulmonary nodules on the  CT scan.  Abdomen/Pelvis:  No abnormal hypermetabolic activity within the liver, pancreas, adrenal glands, or spleen.  No hypermetabolic lymph nodes in the abdomen or pelvis.  Skeleton:  No focal hypermetabolic activity to suggest skeletal metastasis.  IMPRESSION: Two foci of hypermetabolism in the upper right breast, max SUV 2.7, corresponding to biopsy-proven primary breast  carcinoma.  Associated right subpectoral and axillary nodal metastases, max SUV 4.8.  No evidence of distant metastases.   Original Report Authenticated By: Charline Bills, M.D.    Mm Digital Diagnostic Unilat R  03/20/2012  *RADIOLOGY REPORT*  Clinical Data:  Post right breast MR guided core biopsy.  DIGITAL DIAGNOSTIC RIGHT BREAST MAMMOGRAM  Comparison:  03/05/2012, 03/01/2012, 03/01/2011, 02/26/2010 mammograms from [facility].  Findings:  Films are performed following MR guided biopsy of the anterior portion of the upper-outer quadrant of the right breast. The hourglass shaped clip placed during the MR guided core biopsy appears in appropriate position.  IMPRESSION: Appropriate positioning of clip following right breast MR guided core biopsy.   Original Report Authenticated By: Rolla Plate, M.D.     ASSESSMENT: 67 year old [place], [place] woman  #1  status post mastectomy with right axillary lymph node dissection 05/10/2012 for an mpT2 N1, stage stage IIB invasive lobular breast cancer, stage IIB, ER positive, HER-2/neu positive, with an Mib-1 between 5% and 44%  #2 Patient underwent adjuvant chemotherapy consisting of Taxotere, carboplatinum and Herceptin given every 21 days for total of 6 cycles of Taxotere carboplatinum and weekly Herceptin beginning on 06/14/12.  Adjuvant every three week herceptin began on 10/04/12, and patient received radiation therapy from on 10/25/12 through 12/11/12.      #3 Herceptin to be continued to total one year; most recent echo 03/05/13.  Started on Letrozole 02/22/13.  PLAN:   1. Ms. Hooser  is doing well. She will proceed with Herceptin today.  She will continue taking Letrozole daily.   2.  Ms. Kelm will return on 12/23 for labs and Herceptin therapy.  She will return on 05/23/12 for labs, evaluation, and Herceptin therapy.    3. Ms. Holecek is doing well with Lasix PRN.  She will return to Dr. Gala Romney in February, 2015.  All questions were answered. The patient knows to call the clinic with any problems, questions or concerns. We can certainly see the patient much sooner if necessary.   I spent 25 minutes counseling the patient face to face. The total time spent in the appointment was 30 minutes.  Illa Level, NP Medical Oncology [facility] [phone] 04/11/2013, 9:32 AM  ATTENDING'S ATTESTATION:  I personally reviewed patient's chart, examined patient myself, formulated the treatment plan as followed.      Overall patient seems to be doing well. She's tolerating Herceptin very nicely. Since her tumor was ER positive she is on letrozole again tolerating it well. She will complete Herceptin in February 2015. The Taxol will be administered for a total of 5 years  Drue Second, MD Medical/Oncology [facility] [phone] (beeper) [phone] (Office)  04/15/2013, 12:49 AM

## 2013-04-11 NOTE — Telephone Encounter (Signed)
appts made and printed. Pt is aware that tx will be added. i emailed MW to add tx..td 

## 2013-04-12 ENCOUNTER — Telehealth (INDEPENDENT_AMBULATORY_CARE_PROVIDER_SITE_OTHER): Payer: Self-pay

## 2013-04-12 NOTE — Telephone Encounter (Signed)
Called pt back. Dr Carolynne Edouard recommends 2 onth [place] follow up. Possible aspiration at that time.

## 2013-04-12 NOTE — Telephone Encounter (Signed)
Message copied by Brennan Bailey on Fri Apr 12, 2013  3:53 PM ------      Message from: Illa Level      Created: Thu Apr 11, 2013  9:44 AM       Hi Dr. Carolynne Edouard,            You saw Heather [name] a few weeks ago and sent her for an ultrasound of a chest wall nodule.  The ultrasound revealed a complex cyst and Dr. Tilda Burrow at [facility] requested to aspirate it and send it to pathology to be sure.  The patient is requesting that you review her ultrasound results prior to having it aspirated and get your opinion prior to her making any decisions.              Thank you,      Mardella Layman NP ------

## 2013-04-15 ENCOUNTER — Other Ambulatory Visit: Payer: Self-pay | Admitting: Adult Health

## 2013-04-15 DIAGNOSIS — I10 Essential (primary) hypertension: Secondary | ICD-10-CM

## 2013-04-15 DIAGNOSIS — C50411 Malignant neoplasm of upper-outer quadrant of right female breast: Secondary | ICD-10-CM

## 2013-04-15 MED ORDER — SPIRONOLACTONE 25 MG PO TABS: 12.5000 mg | ORAL_TABLET | Freq: Every day | ORAL | Status: DC

## 2013-04-15 MED ORDER — LETROZOLE 2.5 MG PO TABS: 2.5000 mg | ORAL_TABLET | Freq: Every day | ORAL | Status: DC

## 2013-04-30 ENCOUNTER — Ambulatory Visit (HOSPITAL_BASED_OUTPATIENT_CLINIC_OR_DEPARTMENT_OTHER): Payer: Medicare Other

## 2013-04-30 ENCOUNTER — Other Ambulatory Visit (HOSPITAL_BASED_OUTPATIENT_CLINIC_OR_DEPARTMENT_OTHER): Payer: Medicare Other

## 2013-04-30 VITALS — BP 140/77 | HR 67 | Temp 97.5°F | Resp 18

## 2013-04-30 DIAGNOSIS — C50419 Malignant neoplasm of upper-outer quadrant of unspecified female breast: Secondary | ICD-10-CM

## 2013-04-30 DIAGNOSIS — C50412 Malignant neoplasm of upper-outer quadrant of left female breast: Secondary | ICD-10-CM

## 2013-04-30 DIAGNOSIS — C50411 Malignant neoplasm of upper-outer quadrant of right female breast: Secondary | ICD-10-CM

## 2013-04-30 DIAGNOSIS — Z5112 Encounter for antineoplastic immunotherapy: Secondary | ICD-10-CM

## 2013-04-30 LAB — COMPREHENSIVE METABOLIC PANEL (CC13)
ALT: 19 U/L (ref 0–55)
AST: 20 U/L (ref 5–34)
Albumin: 3.6 g/dL (ref 3.5–5.0)
Alkaline Phosphatase: 81 U/L (ref 40–150)
Anion Gap: 9 mEq/L (ref 3–11)
BUN: 12.7 mg/dL (ref 7.0–26.0)
CO2: 28 mEq/L (ref 22–29)
Calcium: 9.8 mg/dL (ref 8.4–10.4)
Chloride: 99 mEq/L (ref 98–109)
Creatinine: 0.8 mg/dL (ref 0.6–1.1)
Glucose: 83 mg/dl (ref 70–140)
Potassium: 4.1 mEq/L (ref 3.5–5.1)
Sodium: 136 mEq/L (ref 136–145)
Total Bilirubin: 0.29 mg/dL (ref 0.20–1.20)
Total Protein: 7.1 g/dL (ref 6.4–8.3)

## 2013-04-30 LAB — CBC WITH DIFFERENTIAL/PLATELET
BASO%: 1.3 % (ref 0.0–2.0)
Basophils Absolute: 0.1 10*3/uL (ref 0.0–0.1)
EOS%: 2.4 % (ref 0.0–7.0)
Eosinophils Absolute: 0.1 10*3/uL (ref 0.0–0.5)
HCT: 38.5 % (ref 34.8–46.6)
HGB: 12.9 g/dL (ref 11.6–15.9)
LYMPH%: 20.5 % (ref 14.0–49.7)
MCH: 29 pg (ref 25.1–34.0)
MCHC: 33.5 g/dL (ref 31.5–36.0)
MCV: 86.5 fL (ref 79.5–101.0)
MONO#: 0.3 10*3/uL (ref 0.1–0.9)
MONO%: 8.1 % (ref 0.0–14.0)
NEUT#: 2.6 10*3/uL (ref 1.5–6.5)
NEUT%: 67.7 % (ref 38.4–76.8)
Platelets: 213 10*3/uL (ref 145–400)
RBC: 4.45 10*6/uL (ref 3.70–5.45)
RDW: 12.9 % (ref 11.2–14.5)
WBC: 3.8 10*3/uL — ABNORMAL LOW (ref 3.9–10.3)
lymph#: 0.8 10*3/uL — ABNORMAL LOW (ref 0.9–3.3)
nRBC: 0 % (ref 0–0)

## 2013-04-30 MED ORDER — SODIUM CHLORIDE 0.9 % IV SOLN
Freq: Once | INTRAVENOUS | Status: AC
  Administered 2013-04-30: 09:00:00 via INTRAVENOUS

## 2013-04-30 MED ORDER — ACETAMINOPHEN 325 MG PO TABS
650.0000 mg | ORAL_TABLET | Freq: Once | ORAL | Status: AC
  Administered 2013-04-30: 650 mg via ORAL

## 2013-04-30 MED ORDER — HEPARIN SOD (PORK) LOCK FLUSH 100 UNIT/ML IV SOLN
500.0000 [IU] | Freq: Once | INTRAVENOUS | Status: AC | PRN
  Administered 2013-04-30: 500 [IU]
  Filled 2013-04-30: qty 5

## 2013-04-30 MED ORDER — SODIUM CHLORIDE 0.9 % IJ SOLN
10.0000 mL | INTRAMUSCULAR | Status: DC | PRN
  Administered 2013-04-30: 10 mL
  Filled 2013-04-30: qty 10

## 2013-04-30 MED ORDER — TRASTUZUMAB CHEMO INJECTION 440 MG
6.0000 mg/kg | Freq: Once | INTRAVENOUS | Status: AC
  Administered 2013-04-30: 378 mg via INTRAVENOUS
  Filled 2013-04-30: qty 18

## 2013-04-30 MED ORDER — ACETAMINOPHEN 325 MG PO TABS
ORAL_TABLET | ORAL | Status: AC
  Filled 2013-04-30: qty 2

## 2013-04-30 NOTE — Patient Instructions (Signed)
[facility] Discharge Instructions for Patients Receiving Chemotherapy  Today you received the following chemotherapy agents Herceptin.  To help prevent nausea and vomiting after your treatment, we encourage you to take your nausea medication as prescribed.   If you develop nausea and vomiting that is not controlled by your nausea medication, call the clinic.   BELOW ARE SYMPTOMS THAT SHOULD BE REPORTED IMMEDIATELY:  *FEVER GREATER THAN 100.5 F  *CHILLS WITH OR WITHOUT FEVER  NAUSEA AND VOMITING THAT IS NOT CONTROLLED WITH YOUR NAUSEA MEDICATION  *UNUSUAL SHORTNESS OF BREATH  *UNUSUAL BRUISING OR BLEEDING  TENDERNESS IN MOUTH AND THROAT WITH OR WITHOUT PRESENCE OF ULCERS  *URINARY PROBLEMS  *BOWEL PROBLEMS  UNUSUAL RASH Items with * indicate a potential emergency and should be followed up as soon as possible.  Feel free to call the clinic you have any questions or concerns. The clinic phone number is [phone].    

## 2013-05-01 ENCOUNTER — Other Ambulatory Visit: Payer: Medicare Other | Admitting: Lab

## 2013-05-01 ENCOUNTER — Ambulatory Visit: Payer: Medicare Other

## 2013-05-01 ENCOUNTER — Ambulatory Visit: Payer: Medicare Other | Admitting: Family

## 2013-05-22 ENCOUNTER — Telehealth: Payer: Self-pay | Admitting: Oncology

## 2013-05-22 ENCOUNTER — Other Ambulatory Visit: Payer: Self-pay | Admitting: Adult Health

## 2013-05-22 ENCOUNTER — Other Ambulatory Visit: Payer: Self-pay | Admitting: Radiology

## 2013-05-22 DIAGNOSIS — N6009 Solitary cyst of unspecified breast: Secondary | ICD-10-CM | POA: Diagnosis not present

## 2013-05-22 DIAGNOSIS — Z853 Personal history of malignant neoplasm of breast: Secondary | ICD-10-CM | POA: Diagnosis not present

## 2013-05-22 NOTE — Telephone Encounter (Signed)
s/w pt re appt for echo 1/19 @ 1pm and  bensimhon 2/2 @ 9:40am. both appts [facility]. appts scheduled per 1/14 pof. no other orders.

## 2013-05-23 ENCOUNTER — Other Ambulatory Visit (HOSPITAL_BASED_OUTPATIENT_CLINIC_OR_DEPARTMENT_OTHER): Payer: Medicare Other

## 2013-05-23 ENCOUNTER — Encounter: Payer: Self-pay | Admitting: Adult Health

## 2013-05-23 ENCOUNTER — Ambulatory Visit (HOSPITAL_BASED_OUTPATIENT_CLINIC_OR_DEPARTMENT_OTHER): Payer: Medicare Other

## 2013-05-23 ENCOUNTER — Telehealth: Payer: Self-pay | Admitting: Oncology

## 2013-05-23 ENCOUNTER — Ambulatory Visit (HOSPITAL_BASED_OUTPATIENT_CLINIC_OR_DEPARTMENT_OTHER): Payer: Medicare Other | Admitting: Adult Health

## 2013-05-23 ENCOUNTER — Encounter: Payer: Self-pay | Admitting: Oncology

## 2013-05-23 ENCOUNTER — Telehealth: Payer: Self-pay | Admitting: *Deleted

## 2013-05-23 VITALS — BP 151/79 | HR 69 | Temp 97.8°F | Resp 17 | Ht 64.0 in | Wt 137.0 lb

## 2013-05-23 DIAGNOSIS — Z5112 Encounter for antineoplastic immunotherapy: Secondary | ICD-10-CM

## 2013-05-23 DIAGNOSIS — C773 Secondary and unspecified malignant neoplasm of axilla and upper limb lymph nodes: Secondary | ICD-10-CM | POA: Diagnosis not present

## 2013-05-23 DIAGNOSIS — C50419 Malignant neoplasm of upper-outer quadrant of unspecified female breast: Secondary | ICD-10-CM

## 2013-05-23 DIAGNOSIS — R42 Dizziness and giddiness: Secondary | ICD-10-CM

## 2013-05-23 DIAGNOSIS — Z170 Estrogen receptor positive status [ER+]: Secondary | ICD-10-CM | POA: Diagnosis not present

## 2013-05-23 DIAGNOSIS — C50411 Malignant neoplasm of upper-outer quadrant of right female breast: Secondary | ICD-10-CM

## 2013-05-23 LAB — CBC WITH DIFFERENTIAL/PLATELET
BASO%: 0.4 % (ref 0.0–2.0)
Basophils Absolute: 0 10*3/uL (ref 0.0–0.1)
EOS%: 1.4 % (ref 0.0–7.0)
Eosinophils Absolute: 0.1 10*3/uL (ref 0.0–0.5)
HCT: 40.3 % (ref 34.8–46.6)
HGB: 13.2 g/dL (ref 11.6–15.9)
LYMPH%: 18.6 % (ref 14.0–49.7)
MCH: 28.6 pg (ref 25.1–34.0)
MCHC: 32.8 g/dL (ref 31.5–36.0)
MCV: 87.4 fL (ref 79.5–101.0)
MONO#: 0.4 10*3/uL (ref 0.1–0.9)
MONO%: 8.1 % (ref 0.0–14.0)
NEUT#: 3.6 10*3/uL (ref 1.5–6.5)
NEUT%: 71.5 % (ref 38.4–76.8)
Platelets: 157 10*3/uL (ref 145–400)
RBC: 4.61 10*6/uL (ref 3.70–5.45)
RDW: 13.4 % (ref 11.2–14.5)
WBC: 5.1 10*3/uL (ref 3.9–10.3)
lymph#: 0.9 10*3/uL (ref 0.9–3.3)

## 2013-05-23 LAB — COMPREHENSIVE METABOLIC PANEL (CC13)
ALT: 17 U/L (ref 0–55)
AST: 20 U/L (ref 5–34)
Albumin: 4.1 g/dL (ref 3.5–5.0)
Alkaline Phosphatase: 72 U/L (ref 40–150)
Anion Gap: 10 mEq/L (ref 3–11)
BUN: 20.5 mg/dL (ref 7.0–26.0)
CO2: 26 mEq/L (ref 22–29)
Calcium: 9.6 mg/dL (ref 8.4–10.4)
Chloride: 99 mEq/L (ref 98–109)
Creatinine: 1 mg/dL (ref 0.6–1.1)
Glucose: 92 mg/dl (ref 70–140)
Potassium: 3.9 mEq/L (ref 3.5–5.1)
Sodium: 135 mEq/L — ABNORMAL LOW (ref 136–145)
Total Bilirubin: 0.41 mg/dL (ref 0.20–1.20)
Total Protein: 7.4 g/dL (ref 6.4–8.3)

## 2013-05-23 MED ORDER — DIPHENHYDRAMINE HCL 25 MG PO CAPS: 25.0000 mg | ORAL_CAPSULE | Freq: Once | ORAL | Status: DC

## 2013-05-23 MED ORDER — SODIUM CHLORIDE 0.9 % IV SOLN
Freq: Once | INTRAVENOUS | Status: AC
  Administered 2013-05-23: 10:00:00 via INTRAVENOUS

## 2013-05-23 MED ORDER — ACETAMINOPHEN 325 MG PO TABS
ORAL_TABLET | ORAL | Status: AC
  Filled 2013-05-23: qty 2

## 2013-05-23 MED ORDER — HEPARIN SOD (PORK) LOCK FLUSH 100 UNIT/ML IV SOLN
500.0000 [IU] | Freq: Once | INTRAVENOUS | Status: AC | PRN
  Administered 2013-05-23: 500 [IU]
  Filled 2013-05-23: qty 5

## 2013-05-23 MED ORDER — ACETAMINOPHEN 325 MG PO TABS
650.0000 mg | ORAL_TABLET | Freq: Once | ORAL | Status: AC
  Administered 2013-05-23: 650 mg via ORAL

## 2013-05-23 MED ORDER — SODIUM CHLORIDE 0.9 % IJ SOLN
10.0000 mL | INTRAMUSCULAR | Status: DC | PRN
  Administered 2013-05-23: 10 mL
  Filled 2013-05-23: qty 10

## 2013-05-23 MED ORDER — TRASTUZUMAB CHEMO INJECTION 440 MG
6.0000 mg/kg | Freq: Once | INTRAVENOUS | Status: AC
  Administered 2013-05-23: 378 mg via INTRAVENOUS
  Filled 2013-05-23: qty 18

## 2013-05-23 NOTE — Progress Notes (Signed)
OFFICE PROGRESS NOTE   Vidal Schwalbe, MD [address] [place] [address] Dr. Autumn Messing Dr. Arloa Koh  DIAGNOSIS: 68 year old female with invasive lobular breast cancer, stage IIB, receiving adjuvant chemotherapy  PRIOR THERAPY:  #1 patient was originally seen in the multidisciplinary breast clinic on 03/14/2012 for new diagnosis of invasive ductal carcinoma that was HER-2 positive node-positive, ER positive PR negative with Ki-67 of 44%  #2 on MRI patient also had an anterior breast mass that was to be biopsied to decide whether or not she should receive neoadjuvant chemotherapy or adjuvant chemotherapy. Patient's mass was biopsied and it was positive for a malignancy that was HER-2/neu negative.  #3 patient underwent right mastectomy with axillary lymph node dissection on 05/10/12 with the final pathology revealing the largest tumor measuring 5 cm, 1.7 cm and 1.6 cm. Tumor was ER positive PR negative HER-2/neu positive with a Ki-67 of 44% 3 of 7 lymph nodes were positive for metastatic disease.  #4 S/p adjuvant chemotherapy consisting of Taxotere carboplatinum given every 21 days with weekly Herceptin. Total of 6 cycles of Taxotere carboplatin was administered starting on 06/14/2012.   #5 She began adjuvant every 3 week herceptin on 5/29.    #5 s/p Radiation therapy from 10/25/12 to 12/11/12.    #6 Patient started on Letrozole on 02/07/2013.  CURRENT THERAPY: Every 3 week Herceptin, Letrozole daily  INTERVAL HISTORY: Ms. Rice is here for f/u prior to her adjuvant Herceptin. She's doing well today. She is experiencing mild headaches and dizziness.  These headaches are dull sinus pressure like headaches, she cannot pinpoint a time frame of when these headaches started, and is having difficulty describing them to me.  She also feels dizzy and light headed particularly with position changes.  She hasn't had any recent nasal congestion or infection, other than a sore  throat and bronchitis.  She denies vision changes, numbness, weakness, or slurred speech.  She is drinking 32 ounces of fluid per day.  She had a cyst removed on her chest wall 05/22/13, and the path results are pending.  She is taking Letrozole daily.  She is tolerating it moderately well, she has mild hot flashes, but denies any increased dryness, or joint aches.  Otherwise, a 10 point ROS is neg.   MEDICAL HISTORY: Past Medical History  Diagnosis Date  . Chronic kidney disease   . Hx: UTI (urinary tract infection)   . Right shoulder pain   . Right knee pain   . Hypertension     recently started Aldactone   . Hyperlipidemia     but not on meds;diet and exercise controlled  . Dizziness     has been going on for 89month;medical MD aware  . H/O hiatal hernia   . GERD (gastroesophageal reflux disease)     Tums prn  . Hemorrhoids   . Urinary frequency     d/t taking Aldactone  . History of UTI   . Anemia     hx of  . Cataract     immature-not sure of which eye  . Anxiety     r/t updated surgery  . Insomnia     takes Melatoniin daily  . Complication of anesthesia     pt states she is sensitive to meds  . Breast cancer     right  . Skin cancer   . Hx of radiation therapy 10/25/12- 12/11/12    right chest wall/regional lymph nodes 5040 cGy, 28 sessions, right  chest wall boost 1000 cGy 1 session    ALLERGIES:  is allergic to codeine; keflex; naprosyn; and tussin.  MEDICATIONS:  Current Outpatient Prescriptions  Medication Sig Dispense Refill  . B Complex-C (SUPER B COMPLEX PO) Take 1 each by mouth daily.      . calcium carbonate (TUMS - DOSED IN MG ELEMENTAL CALCIUM) 500 MG chewable tablet Chew 1 tablet by mouth daily.      . Cholecalciferol (VITAMIN D-3 PO) Take 4,000 Units by mouth daily.      Marland Kitchen letrozole (FEMARA) 2.5 MG tablet Take 1 tablet (2.5 mg total) by mouth daily.  90 tablet  12  . lidocaine-prilocaine (EMLA) cream Apply topically as needed.  30 g  7  . magnesium  gluconate (MAGONATE) 500 MG tablet Take 500 mg by mouth 2 (two) times daily.      . Probiotic Product (PROBIOTIC DAILY PO) Take 1 each by mouth.      . spironolactone (ALDACTONE) 25 MG tablet Take 0.5 tablets (12.5 mg total) by mouth daily.  45 tablet  12  . clindamycin (CLINDAGEL) 1 % gel Apply topically 2 (two) times daily.  30 g  0  . furosemide (LASIX) 20 MG tablet Take 1 tablet (20 mg total) by mouth daily as needed.  30 tablet  3  . omeprazole (PRILOSEC) 40 MG capsule Take 1 capsule (40 mg total) by mouth daily.  30 capsule  6  . potassium chloride SA (K-DUR,KLOR-CON) 20 MEQ tablet Take 1 tablet (20 mEq total) by mouth daily as needed. When you take Furosemide  90 tablet  3  . UNABLE TO FIND Rx: L8000- Post Surgical Bra (Quantity: 6) F3832- Silicone Breast Prosthesis (Quantity: 1) Dx: 174.9; Right mastectomy  1 each  0   No current facility-administered medications for this visit.    SURGICAL HISTORY:  Past Surgical History  Procedure Laterality Date  . Cyst removed from left breast  1970  . Left wrist surgery   2004    with plate  . Colonoscopy    . Esophagogastroduodenoscopy    . Mastectomy, radical    . Mastectomy modified radical  05/10/2012    Procedure: MASTECTOMY MODIFIED RADICAL;  Surgeon: Merrie Roof, MD;  Location: [facility];  Service: General;  Laterality: Right;  RIGHT MODIFIED RADICAL MASTECTOMY  . Portacath placement  05/10/2012    Procedure: INSERTION PORT-A-CATH;  Surgeon: Merrie Roof, MD;  Location: [facility];  Service: General;  Laterality: Left;    REVIEW OF SYSTEMS:   A 10 point review of systems was conducted and is otherwise negative except for what is noted above.    Health Maintenance  Mammogram: 02/2013  Colonoscopy: 2 years ago Bone Density Scan: 01/03/13 Pap Smear: 10/2011 Eye Exam: 02/12/2013 Vitamin D Level: 01/17/13 Lipid Panel:01/17/13   PHYSICAL EXAMINATION: Blood pressure 151/79, pulse 69, temperature 97.8 F (36.6 C), temperature source  Oral, resp. rate 17, height _0  (1.626 m), weight 137 lb (62.143 kg). Body mass index is 23.5 kg/(m^2). GENERAL: Patient is a well appearing female in no acute distress HEENT:  Sclerae anicteric.  Oropharynx clear and moist. No ulcerations or evidence of oropharyngeal candidiasis. Neck is supple.  NODES:  No cervical, supraclavicular, or axillary lymphadenopathy palpated.  BREAST EXAM:  Deferred. LUNGS:  Clear to auscultation bilaterally.  No wheezes or rhonchi. HEART:  Regular rate and rhythm. No murmur appreciated. ABDOMEN:  Soft, nontender.  Positive, normoactive bowel sounds. No organomegaly palpated. MSK:  No focal  spinal tenderness to palpation. Full range of motion bilaterally in the upper extremities. EXTREMITIES:  No peripheral edema.   SKIN:  Clear with no obvious rashes or skin changes. No nail dyscrasia. NEURO:  Nonfocal. Well oriented.  Appropriate affect. ECOG PERFORMANCE STATUS: 1   LABORATORY DATA: Lab Results  Component Value Date   WBC 5.1 05/23/2013   HGB 13.2 05/23/2013   HCT 40.3 05/23/2013   MCV 87.4 05/23/2013   PLT 157 05/23/2013      Chemistry      Component Value Date/Time   NA 135* 05/23/2013 0854   NA 138 05/11/2012 0640   K 3.9 05/23/2013 0854   K 4.7 05/11/2012 0640   CL 101 10/25/2012 0946   CL 99 05/11/2012 0640   CO2 26 05/23/2013 0854   CO2 20 05/11/2012 0640   BUN 20.5 05/23/2013 0854   BUN 12 05/11/2012 0640   CREATININE 1.0 05/23/2013 0854   CREATININE 0.88 05/11/2012 0640      Component Value Date/Time   CALCIUM 9.6 05/23/2013 0854   CALCIUM 9.7 05/11/2012 0640   ALKPHOS 72 05/23/2013 0854   AST 20 05/23/2013 0854   ALT 17 05/23/2013 0854   BILITOT 0.41 05/23/2013 0854     ADDITIONAL INFORMATION: CHROMOGENIC IN-SITU HYBRIDIZATION (1C - TUMOR WITH CLIP) Interpretation HER-2/NEU BY CISH - NO AMPLIFICATION OF HER-2 DETECTED. THE RATIO OF HER-2: CEP 17 SIGNALS WAS 1.25. Reference range: Ratio: HER2:CEP17 < 1.8 - gene amplification not observed Ratio:  HER2:CEP 17 1.8-2.2 - equivocal result Ratio: HER2:CEP17 > 2.2 - gene amplification observed CHROMOGENIC IN-SITU HYBRIDIZATION (1E - TUMOR WITHOUT CLIP) Interpretation: HER2/NEU BY CISH - SHOWS AMPLIFICATION BY CISH ANALYSIS. THE RATIO OF HER2: CEP 17 SIGNALS WAS 2.83. Reference range: Ratio: HER2:CEP17 < 1.8 gene amplification not observed Ratio: HER2:CEP 17 1.8-2.2 - equivocal result Ratio: HER2:CEP17 > 2.2 - gene amplification observed Enid Cutter MD Pathologist, Electronic Signature ( Signed 05/15/2012) FINAL DIAGNOSIS Diagnosis Breast, modified radical mastectomy , right, with axillary contents 1 of 4 FINAL for JOYEL, CHENETTE S ([id number]) Diagnosis(continued) TUMOR #1 (WITH CLIP): - INVASIVE LOBULAR CARCINOMA, GRADE II, SEE COMMENT. - LYMPHOVASCULAR INVASION IDENTIFIED. - PREVIOUS BIOPSY SITE IDENTIFIED. TUMOR #2 (NO CLIP): - INVASIVE LOBULAR CARCINOMA, GRADE II, SEE COMMENT. - LOBULAR CARCINOMA IN SITU. - LYMPHOVASCULAR INVASION IDENTIFIED. - THREE LYMPH NODES, POSITIVE FOR METASTATIC MAMMARY CARCINOMA (3/7). - TUMOR DEPOSITS ARE 1.7, 1.6, AND 5 CM. - EXTRACAPSULAR TUMOR EXTENSION IDENTIFIED. - TWO LYMPH NODES, POSITIVE FOR ISOLATED TUMOR CELLS. Microscopic Comment BREAST, INVASIVE TUMOR, WITH LYMPH NODE SAMPLING Specimen, including laterality: Right breast Procedure: Modified radical mastectomy Grade: Tumor 1 and tumor 2: Grade II of III Tubule formation: 3 Nuclear pleomorphism: 2 Mitotic: 1 Tumor size (gross measurement: Tumor #1- 2.3 cm; tumor #2 - 2.4 cm Margins: Invasive, distance to closest margin: Tumor #1 2.7 cm - deep margin; tumor #2 - 1.6 cm - deep margin Lymphovascular invasion: Present Ductal carcinoma in situ: Absent Grade: N/A Extensive intraductal component: N/A Lobular neoplasia: Both atypical hyperplasia and in situ carcinoma Extent of tumor: Skin: Grossly negative Nipple: Grossly negative Skeletal muscle: Microscopically negative Lymph  nodes: # examined: 7 Lymph nodes with metastasis: 3 Isolated tumor cells (< 0.2 mm): two lymph nodes (Slide 1P and 1O) Micrometastasis: (> 0.2 mm and < 2.0 mm): none Macrometastasis: (> 2.0 mm): 1.7 cm , 1.6 cm and 5 cm Extracapsular extension: Present Breast prognostic profile: Tumor #1: Estrogen receptor: Not repeated, previous study demonstrated 98% positivity ([id number]) Progesterone receptor: Not  repeated, previous study demonstrated 0% positivity ([id number]) Her 2 neu: Repeated, previous study demonstrated no amplification (1.38) ([id number]) Ki-67: Not repeated, previous study demonstrated 5% proliferation rate ([id number]) Tumor #2: Estrogen receptor: Not repeated, previous study demonstrated 97% positivity ([id number]) Progesterone receptor: Not repeated, previous study demonstrated 0% positivity ([id number]) Her 2 neu: Repeated, previous study demonstrated amplification (3.65) ([id number]) Ki-67: Not repeated, previous study demonstrated 44% proliferation rate ([id number]) 2 of 4 FINAL for LAVAYAH, VITA (SZA14-4) Microscopic Comment(continued) Non-neoplastic breast: Benign fibrocystic change with usual ductal hyperplasia, sclerosing adenosis, benign radial sclerosing lesion, and microcalcifications in benign ducts and lobules. TNM: mpT2, pN1a, pMX. Comments: none  RADIOGRAPHIC STUDIES:  Mr Kizzie Fantasia Contrast  03/23/2012  *RADIOLOGY REPORT*  Clinical Data: New diagnosis breast cancer.  Rule out metastatic disease.  MRI HEAD WITHOUT AND WITH CONTRAST  Technique:  Multiplanar, multiecho pulse sequences of the brain and surrounding structures were obtained according to standard protocol without and with intravenous contrast  Contrast: 9m MULTIHANCE GADOBENATE DIMEGLUMINE 529 MG/ML IV SOLN  Comparison: None.  Findings: Ventricle size is normal.  Small 4 mm hyperintensities in the cerebral white matter bilaterally do not enhance and are most likely due to chronic  microvascular ischemia.  Negative for acute infarct.  Diffusion weighted imaging is normal.  Pituitary is normal in size.  Negative for intracranial hemorrhage.  Following contrast infusion, no enhancing lesions are identified.  Paranasal sinuses are clear.  Vessels at the base of the brain are patent.  IMPRESSION: Negative for metastatic disease.  No acute abnormality.   Original Report Authenticated By: DCarl Best M.D.    Mr Breast Bilateral W Wo Contrast  03/12/2012  *RADIOLOGY REPORT*  Clinical Data: Recently diagnosed invasive mammary carcinoma in the 10 o'clock region of the right breast with a metastatic right axillary lymph node.  BILATERAL BREAST MRI WITH AND WITHOUT CONTRAST  Technique: Multiplanar, multisequence MR images of both breasts were obtained prior to and following the intravenous administration of 138mof multihance.  Three dimensional images were evaluated at the independent DynaCad workstation.  Comparison:  Mammograms dated 03/05/2012, 03/01/2012, 03/01/2011 [facility] Findings: There is a moderate background parenchymal enhancement pattern.  Right breast: 1.  There is a lobulated 2.2 x 1.7 x 1.8 cm mass in the posterior third of the upper outer quadrant of the right breast.  There is a post biopsy clip artifact 1 cm lateral to the mass. 2.  In the anterior third upper outer quadrant of the right breast there is patchy non mass like enhancement measuring 0.8 x 0.3 x 0.3 cm.  Left breast:  There is no abnormal enhancement the left breast.  There are enlarged right axillary lymph nodes.  The largest right axillary lymph node measures 2.4 cm and is located lateral to the pectoralis minor consistent with level I adenopathy.  There is a 1.6 cm lymph node behind the pectoralis minor consistent with enlarged level II adenopathy.  IMPRESSION:  1.  2.2 cm mass in the posterior third of the upper outer quadrant of the right breast corresponding with the known  invasive mammary carcinoma. 2.  Patchy non mass-like enhancement in the anterior third of the upper outer quadrant of the right breast.  MR guided core biopsy is recommended. 3. Enlarged level I and II right axillary adenopathy.  RECOMMENDATION: MR guided core biopsy of the right breast.  THREE-DIMENSIONAL MR IMAGE RENDERING ON INDEPENDENT WORKSTATION:  Three-dimensional MR images were rendered by post-processing of the  original MR data on an independent workstation.  The three- dimensional MR images were interpreted, and findings were reported in the accompanying complete MRI report for this study.  BI-RADS CATEGORY 4:  Suspicious abnormality - biopsy should be considered.   Original Report Authenticated By: Lillia Mountain, M.D.    Mr Biopsy/wire Localization  03/20/2012  *RADIOLOGY REPORT*  Clinical Data:  Known right breast carcinoma.  Additional area of worrisome enhancement located within the anterior upper outer quadrant of the right breast.  MRI GUIDED VACUUM ASSISTED BIOPSY OF THE RIGHT BREAST WITHOUT AND WITH CONTRAST  Comparison: Previous exams.  Technique: Multiplanar, multisequence MR images of the right breast were obtained prior to and following the intravenous administration of 12 ml of Mulithance.  I met with the patient, and we discussed the procedure of MRI guided biopsy, including risks, benefits, and alternatives. Specifically, we discussed the risks of infection, bleeding, tissue injury, clip migration, and inadequate sampling.  Informed, written consent was given.  Using sterile technique, 2% Lidocaine, MRI guidance, and a 9 gauge vacuum assisted device, biopsy was performed of the area of enhancement located within the anterior portion of the upper-outer quadrant - right breast using a lateral approach.  At the conclusion of the procedure, a  tissue marker clip was deployed into the biopsy cavity.  IMPRESSION: MRI guided biopsy of the area of enhancement located within the anterior portion of  the upper-outer quadrant of the right breast as discussed above. No apparent complications.  BI-RADS CATEGORY 6:  Known biopsy-proven malignancy - appropriate action should be taken.  THREE-DIMENSIONAL MR IMAGE RENDERING ON INDEPENDENT WORKSTATION:  Three-dimensional MR images were rendered by post-processing of the original MR data on an independent workstation.  The three- dimensional MR images were interpreted, and findings were reported in the accompanying complete MRI report for this study.  The pathology associated with the right breast MR guided core biopsy demonstrated invasive mammary carcinoma and in situ mammary carcinoma.  This is concordant with imaging findings.  I have discussed findings with the patient by telephone and answered her questions.  The patient states that biopsy site is clean and dry without hematoma formation or signs of infection.  Post biopsy wound care instructions were reviewed with the patient.  Follow-up will be with Dr. Marlou Starks and Dr. Humphrey Rolls regards her treatment plan.  The patient was encouraged to call the Breast Center for additional questions or concerns   Original Report Authenticated By: Altamese Cabal, M.D.    Nm Pet Image Initial (pi) Skull Base To Thigh  03/22/2012  *RADIOLOGY REPORT*  Clinical Data: Initial treatment strategy for high risk right breast cancer.  NUCLEAR MEDICINE PET SKULL BASE TO THIGH  Fasting Blood Glucose:  100  Technique:  18.6 mCi F-18 FDG was injected intravenously. CT data was obtained and used for attenuation correction and anatomic localization only.  (This was not acquired as a diagnostic CT examination.) Additional exam technical data entered on technologist worksheet.  Comparison:  MRI breast dated 03/12/2012  Findings:  Neck: No hypermetabolic lymph nodes in the neck.  Chest:  Vague hypermetabolism in the upper/central right breast, max SUV 2.7.  Additional focal hypermetabolism in the upper/outer right breast, max SUV 2.3 (PET image 105).   These findings correspond to biopsy-proven primary breast carcinoma.  1.3 cm short axis right subpectoral lymph node (series 2/image 66), max SUV 3.1.  Right axillary lymphadenopathy measuring up to 1.6 cm short axis (series 2/image 88), max SUV 4.8).  No hypermetabolic mediastinal  or hilar nodes.  No suspicious pulmonary nodules on the CT scan.  Abdomen/Pelvis:  No abnormal hypermetabolic activity within the liver, pancreas, adrenal glands, or spleen.  No hypermetabolic lymph nodes in the abdomen or pelvis.  Skeleton:  No focal hypermetabolic activity to suggest skeletal metastasis.  IMPRESSION: Two foci of hypermetabolism in the upper right breast, max SUV 2.7, corresponding to biopsy-proven primary breast carcinoma.  Associated right subpectoral and axillary nodal metastases, max SUV 4.8.  No evidence of distant metastases.   Original Report Authenticated By: Julian Hy, M.D.    Mm Digital Diagnostic Unilat R  03/20/2012  *RADIOLOGY REPORT*  Clinical Data:  Post right breast MR guided core biopsy.  DIGITAL DIAGNOSTIC RIGHT BREAST MAMMOGRAM  Comparison:  03/05/2012, 03/01/2012, 03/01/2011, 02/26/2010 mammograms from [facility] imaging.  Findings:  Films are performed following MR guided biopsy of the anterior portion of the upper-outer quadrant of the right breast. The hourglass shaped clip placed during the MR guided core biopsy appears in appropriate position.  IMPRESSION: Appropriate positioning of clip following right breast MR guided core biopsy.   Original Report Authenticated By: Altamese Cabal, M.D.     ASSESSMENT: 68 year old [place], [place] woman  #1  status post mastectomy with right axillary lymph node dissection 05/10/2012 for an mpT2 N1, stage stage IIB invasive lobular breast cancer, stage IIB, ER positive, HER-2/neu positive, with an Mib-1 between 5% and 44%  #2 Patient underwent adjuvant chemotherapy consisting of Taxotere, carboplatinum and Herceptin given every 21 days for total of 6  cycles of Taxotere carboplatinum and weekly Herceptin beginning on 06/14/12.  Adjuvant every three week herceptin began on 10/04/12, and patient received radiation therapy from on 10/25/12 through 12/11/12.      #3 Herceptin to be continued to total one year; most recent echo 03/05/13.  Started on Letrozole 02/22/13.  Her next echo is scheduled on 05/27/13 and f/u with Dr. Haroldine Laws on 06/10/13.    PLAN:   1. Ms. Somero is doing well. She will proceed with Herceptin today.  She will continue taking Letrozole daily. She is tolerating therapy well.    2.  We discussed her dizziness for a large portion of the appointment.  I instructed her to increase her fluid intake.  She did have an MRI of the brain on 03/22/12 around the time she was first diagnosed due to dizziness, that was worse than this, and it was negative.  We discussed her hypertension, her blood pressure, and the possibility of inner ear problems.  I encouraged her to follow up with her PCP about the dizziness.  The patient wants to continue to monitor her dizziness, and increase her fluid intake at this time.  She wants no further intervention.    3. Ms. Odeh is doing well with Lasix PRN she has not needed it recently.  She will return to Dr. Haroldine Laws in February, 2015.  4.  She will return in 3 weeks for labs, evaluation, and her next dose of Herceptin.    All questions were answered. The patient knows to call the clinic with any problems, questions or concerns. We can certainly see the patient much sooner if necessary.   I spent 25 minutes counseling the patient face to face. The total time spent in the appointment was 30 minutes.  Minette Headland, [place] [phone] 05/25/2013, 8:55 AM

## 2013-05-23 NOTE — Telephone Encounter (Signed)
Per staff message and POF I have scheduled appts.  JMW  

## 2013-05-23 NOTE — Patient Instructions (Signed)
[facility] Discharge Instructions for Patients Receiving Chemotherapy  Today you received the following chemotherapy agents: herceptin  To help prevent nausea and vomiting after your treatment, we encourage you to take your nausea medication.  Take it as often as prescribed.     If you develop nausea and vomiting that is not controlled by your nausea medication, call the clinic. If it is after clinic hours your family physician or the after hours number for the clinic or go to the Emergency Department.   BELOW ARE SYMPTOMS THAT SHOULD BE REPORTED IMMEDIATELY:  *FEVER GREATER THAN 100.5 F  *CHILLS WITH OR WITHOUT FEVER  NAUSEA AND VOMITING THAT IS NOT CONTROLLED WITH YOUR NAUSEA MEDICATION  *UNUSUAL SHORTNESS OF BREATH  *UNUSUAL BRUISING OR BLEEDING  TENDERNESS IN MOUTH AND THROAT WITH OR WITHOUT PRESENCE OF ULCERS  *URINARY PROBLEMS  *BOWEL PROBLEMS  UNUSUAL RASH Items with * indicate a potential emergency and should be followed up as soon as possible.  Feel free to call the clinic you have any questions or concerns. The clinic phone number is [phone].   I have been informed and understand all the instructions given to me. I know to contact the clinic, my physician, or go to the Emergency Department if any problems should occur. I do not have any questions at this time, but understand that I may call the clinic during office hours   should I have any questions or need assistance in obtaining follow up care.    __________________________________________  _____________  __________ Signature of Patient or Authorized Representative            Date                   Time    __________________________________________ Nurse's Signature

## 2013-05-27 ENCOUNTER — Other Ambulatory Visit (HOSPITAL_COMMUNITY): Payer: Self-pay | Admitting: Cardiology

## 2013-05-27 ENCOUNTER — Ambulatory Visit (HOSPITAL_COMMUNITY)
Admission: RE | Admit: 2013-05-27 | Discharge: 2013-05-27 | Disposition: A | Discharge status: Home / Self Care | Payer: Medicare Other | Source: Ambulatory Visit | Attending: Family Medicine | Admitting: Family Medicine

## 2013-05-27 DIAGNOSIS — C50919 Malignant neoplasm of unspecified site of unspecified female breast: Secondary | ICD-10-CM | POA: Diagnosis not present

## 2013-05-27 DIAGNOSIS — I10 Essential (primary) hypertension: Secondary | ICD-10-CM | POA: Diagnosis not present

## 2013-05-27 DIAGNOSIS — E785 Hyperlipidemia, unspecified: Secondary | ICD-10-CM | POA: Insufficient documentation

## 2013-05-27 DIAGNOSIS — Z09 Encounter for follow-up examination after completed treatment for conditions other than malignant neoplasm: Secondary | ICD-10-CM | POA: Diagnosis not present

## 2013-05-27 NOTE — Progress Notes (Signed)
Echo Lab  2D Echocardiogram completed.  [place], RDCS 05/27/2013 1:23 PM

## 2013-05-28 ENCOUNTER — Encounter (INDEPENDENT_AMBULATORY_CARE_PROVIDER_SITE_OTHER): Payer: Self-pay

## 2013-06-10 ENCOUNTER — Other Ambulatory Visit: Payer: Self-pay | Admitting: Oncology

## 2013-06-10 ENCOUNTER — Encounter (HOSPITAL_COMMUNITY): Payer: Self-pay

## 2013-06-10 ENCOUNTER — Ambulatory Visit (HOSPITAL_COMMUNITY)
Admission: RE | Admit: 2013-06-10 | Discharge: 2013-06-10 | Disposition: A | Discharge status: Home / Self Care | Payer: Medicare Other | Source: Ambulatory Visit | Attending: Internal Medicine | Admitting: Internal Medicine

## 2013-06-10 VITALS — BP 112/76 | HR 75 | Wt 135.8 lb

## 2013-06-10 DIAGNOSIS — I129 Hypertensive chronic kidney disease with stage 1 through stage 4 chronic kidney disease, or unspecified chronic kidney disease: Secondary | ICD-10-CM | POA: Insufficient documentation

## 2013-06-10 DIAGNOSIS — C50919 Malignant neoplasm of unspecified site of unspecified female breast: Secondary | ICD-10-CM | POA: Diagnosis not present

## 2013-06-10 DIAGNOSIS — I10 Essential (primary) hypertension: Secondary | ICD-10-CM

## 2013-06-10 DIAGNOSIS — C50419 Malignant neoplasm of upper-outer quadrant of unspecified female breast: Secondary | ICD-10-CM | POA: Diagnosis not present

## 2013-06-10 DIAGNOSIS — N189 Chronic kidney disease, unspecified: Secondary | ICD-10-CM | POA: Diagnosis not present

## 2013-06-10 NOTE — Progress Notes (Signed)
Patient ID: Heather [name], female   DOB: 1945/09/20, 68 y.o.   MRN: [id number]  Referring Oncologist: Dr Humphrey Rolls Oncologistt: Dr Humphrey Rolls PCP: Dr Dema Severin General Surgeon: Dr Marlou Starks  HPI: Heather [name] is a 68 year old female with invasive ductal carcinoma in R breast that is ER positive and HER-2/neu positive,  S/P R mastectomy and lymph node dissection 05/10/12.    She has been treated for HTN in the past but stopped taking the medications due to elevated renal function. Intolerant Ace-I due to cough.    She received a total of 6 cycles of Taxotere/carboplatin starting on 06/14/2012 and finishing in May. XRT finished in August 2014. Now receiving Herceptin every 3 weeks to end at the end of February 2015.    She returns for follow up. BP is well-controlled now.Tolerating herceptin well. Getting more active.  No dyspnea or orthopnea. She has 1 or 2 more doses of Herceptin due (will be told today for sure).   03/28/12 ECHO 60-65% Lateral S' 9.3 09/17/12 ECHO 55% lateral s' 9.4 03/05/13 ECHO EF 55% lateral s' 11.1 1/15 ECHO EF 60-65%, lateral s' 11.3, global longitudinal strain -22.9%  Labs (1/15): K 3.9, creatinine 1.0  Past Medical History  Diagnosis Date  . Chronic kidney disease   . Hx: UTI (urinary tract infection)   . Right shoulder pain   . Right knee pain   . Hypertension     recently started Aldactone   . Hyperlipidemia     but not on meds;diet and exercise controlled  . Dizziness     has been going on for 59month;medical MD aware  . H/O hiatal hernia   . GERD (gastroesophageal reflux disease)     Tums prn  . Hemorrhoids   . Urinary frequency     d/t taking Aldactone  . History of UTI   . Anemia     hx of  . Cataract     immature-not sure of which eye  . Anxiety     r/t updated surgery  . Insomnia     takes Melatoniin daily  . Complication of anesthesia     pt states she is sensitive to meds  . Breast cancer     right  . Skin cancer   . Hx of radiation therapy 10/25/12- 12/11/12     right chest wall/regional lymph nodes 5040 cGy, 28 sessions, right chest wall boost 1000 cGy 1 session    Current Outpatient Prescriptions  Medication Sig Dispense Refill  . B Complex-C (SUPER B COMPLEX PO) Take 1 each by mouth daily.      . calcium carbonate (TUMS - DOSED IN MG ELEMENTAL CALCIUM) 500 MG chewable tablet Chew 1 tablet by mouth daily.      . Cholecalciferol (VITAMIN D-3 PO) Take 4,000 Units by mouth daily.      . clindamycin (CLINDAGEL) 1 % gel Apply topically 2 (two) times daily.  30 g  0  . furosemide (LASIX) 20 MG tablet Take 1 tablet (20 mg total) by mouth daily as needed.  30 tablet  3  . lidocaine-prilocaine (EMLA) cream Apply topically as needed.  30 g  7  . magnesium gluconate (MAGONATE) 500 MG tablet Take 500 mg by mouth 2 (two) times daily.      .Marland Kitchenomeprazole (PRILOSEC) 40 MG capsule Take 1 capsule (40 mg total) by mouth daily.  30 capsule  6  . potassium chloride SA (K-DUR,KLOR-CON) 20 MEQ tablet Take 1 tablet (20 mEq total) by mouth  daily as needed. When you take Furosemide  90 tablet  3  . Probiotic Product (PROBIOTIC DAILY PO) Take 1 each by mouth.      . spironolactone (ALDACTONE) 25 MG tablet Take 0.5 tablets (12.5 mg total) by mouth daily.  45 tablet  12  . UNABLE TO FIND Rx: L8000- Post Surgical Bra (Quantity: 6) H8850- Silicone Breast Prosthesis (Quantity: 1) Dx: 174.9; Right mastectomy  1 each  0  . letrozole (FEMARA) 2.5 MG tablet Take 1 tablet (2.5 mg total) by mouth daily.  90 tablet  12   No current facility-administered medications for this encounter.     Allergies  Allergen Reactions  . Codeine     " loopy"  . Keflex [Cephalexin]     GI Upset  . Naprosyn [Naproxen] Other (See Comments)    GI ipset   . Tussin [Guaifenesin] Other (See Comments)    Dizzy     History   Social History  . Marital Status: Married    Spouse Name: N/A    Number of Children: 2  . Years of Education: N/A   Occupational History  . Not on file.   Social  History Main Topics  . Smoking status: Never Smoker   . Smokeless tobacco: Never Used  . Alcohol Use: No  . Drug Use: No  . Sexual Activity: Yes    Birth Control/ Protection: Post-menopausal   Other Topics Concern  . Not on file   Social History Narrative  . No narrative on file    Family History  Problem Relation Age of Onset  . Leukemia Mother   . Colon cancer Brother   . Cancer Brother     Prostate  . Colon cancer Maternal Grandmother     PHYSICAL EXAM: Filed Vitals:   06/10/13 0942  BP: 112/76  Pulse: 75   General:  Well appearing. No respiratory difficulty HEENT: normal Neck: supple. no JVD. Carotids 2+ bilat; no bruits. No lymphadenopathy or thryomegaly appreciated. Cor: PMI nondisplaced. Regular rate & rhythm. No rubs, gallops or murmurs. L port-a-cath Lungs: clear Abdomen: soft, nontender, nondistended. No hepatosplenomegaly. No bruits or masses. Good bowel sounds. Extremities: no cyanosis, clubbing, rash, no lower extremity edema. Right arm sleeve Neuro: alert & oriented x 3, cranial nerves grossly intact. moves all 4 extremities w/o difficulty. Affect pleasant.   ASSESSMENT & PLAN: 1. R breast CA: I reviewed the echo today.  EF and doppler parameters stable.  No CHF on exam.  Continue Herceptin, will need 1 or 2 more doses (to find out today).  She has done well so far.  Will do 1 more echo in 3 months after her treatment is complete.  2. HTN: BP stable, continue current regiment.  K was normal recently.   Dalton McLean,MD 10:11 AM

## 2013-06-10 NOTE — Patient Instructions (Addendum)
Follow up in 3 months with an ECHO 

## 2013-06-13 ENCOUNTER — Encounter (INDEPENDENT_AMBULATORY_CARE_PROVIDER_SITE_OTHER): Payer: Self-pay | Admitting: General Surgery

## 2013-06-13 ENCOUNTER — Ambulatory Visit (INDEPENDENT_AMBULATORY_CARE_PROVIDER_SITE_OTHER): Payer: Medicare Other | Admitting: General Surgery

## 2013-06-13 ENCOUNTER — Ambulatory Visit (HOSPITAL_BASED_OUTPATIENT_CLINIC_OR_DEPARTMENT_OTHER): Payer: Medicare Other | Admitting: Oncology

## 2013-06-13 ENCOUNTER — Other Ambulatory Visit (HOSPITAL_BASED_OUTPATIENT_CLINIC_OR_DEPARTMENT_OTHER): Payer: Medicare Other

## 2013-06-13 ENCOUNTER — Encounter: Payer: Self-pay | Admitting: Oncology

## 2013-06-13 ENCOUNTER — Ambulatory Visit (HOSPITAL_BASED_OUTPATIENT_CLINIC_OR_DEPARTMENT_OTHER): Payer: Medicare Other

## 2013-06-13 ENCOUNTER — Telehealth: Payer: Self-pay | Admitting: *Deleted

## 2013-06-13 VITALS — BP 152/82 | HR 71 | Temp 98.0°F | Resp 18 | Ht 64.0 in | Wt 138.0 lb

## 2013-06-13 VITALS — BP 130/80 | HR 68 | Resp 14 | Ht 64.0 in | Wt 139.2 lb

## 2013-06-13 DIAGNOSIS — C50419 Malignant neoplasm of upper-outer quadrant of unspecified female breast: Secondary | ICD-10-CM

## 2013-06-13 DIAGNOSIS — Z5112 Encounter for antineoplastic immunotherapy: Secondary | ICD-10-CM

## 2013-06-13 DIAGNOSIS — Z170 Estrogen receptor positive status [ER+]: Secondary | ICD-10-CM

## 2013-06-13 DIAGNOSIS — C50411 Malignant neoplasm of upper-outer quadrant of right female breast: Secondary | ICD-10-CM

## 2013-06-13 LAB — COMPREHENSIVE METABOLIC PANEL (CC13)
ALT: 18 U/L (ref 0–55)
AST: 21 U/L (ref 5–34)
Albumin: 4.2 g/dL (ref 3.5–5.0)
Alkaline Phosphatase: 75 U/L (ref 40–150)
Anion Gap: 9 mEq/L (ref 3–11)
BUN: 17.5 mg/dL (ref 7.0–26.0)
CO2: 27 mEq/L (ref 22–29)
Calcium: 10 mg/dL (ref 8.4–10.4)
Chloride: 100 mEq/L (ref 98–109)
Creatinine: 0.9 mg/dL (ref 0.6–1.1)
Glucose: 88 mg/dl (ref 70–140)
Potassium: 4.1 mEq/L (ref 3.5–5.1)
Sodium: 136 mEq/L (ref 136–145)
Total Bilirubin: 0.61 mg/dL (ref 0.20–1.20)
Total Protein: 7.3 g/dL (ref 6.4–8.3)

## 2013-06-13 LAB — CBC WITH DIFFERENTIAL/PLATELET
BASO%: 0.9 % (ref 0.0–2.0)
Basophils Absolute: 0.1 10*3/uL (ref 0.0–0.1)
EOS%: 1.6 % (ref 0.0–7.0)
Eosinophils Absolute: 0.1 10*3/uL (ref 0.0–0.5)
HCT: 39.5 % (ref 34.8–46.6)
HGB: 13.2 g/dL (ref 11.6–15.9)
LYMPH%: 15.9 % (ref 14.0–49.7)
MCH: 28.6 pg (ref 25.1–34.0)
MCHC: 33.4 g/dL (ref 31.5–36.0)
MCV: 85.7 fL (ref 79.5–101.0)
MONO#: 0.5 10*3/uL (ref 0.1–0.9)
MONO%: 9.7 % (ref 0.0–14.0)
NEUT#: 4 10*3/uL (ref 1.5–6.5)
NEUT%: 71.9 % (ref 38.4–76.8)
Platelets: 166 10*3/uL (ref 145–400)
RBC: 4.61 10*6/uL (ref 3.70–5.45)
RDW: 13.6 % (ref 11.2–14.5)
WBC: 5.6 10*3/uL (ref 3.9–10.3)
lymph#: 0.9 10*3/uL (ref 0.9–3.3)

## 2013-06-13 MED ORDER — ACETAMINOPHEN 325 MG PO TABS
ORAL_TABLET | ORAL | Status: AC
  Filled 2013-06-13: qty 2

## 2013-06-13 MED ORDER — SODIUM CHLORIDE 0.9 % IV SOLN
Freq: Once | INTRAVENOUS | Status: AC
  Administered 2013-06-13: 10:00:00 via INTRAVENOUS

## 2013-06-13 MED ORDER — HEPARIN SOD (PORK) LOCK FLUSH 100 UNIT/ML IV SOLN
500.0000 [IU] | Freq: Once | INTRAVENOUS | Status: AC | PRN
  Administered 2013-06-13: 500 [IU]
  Filled 2013-06-13: qty 5

## 2013-06-13 MED ORDER — SODIUM CHLORIDE 0.9 % IJ SOLN
10.0000 mL | INTRAMUSCULAR | Status: DC | PRN
  Administered 2013-06-13: 10 mL
  Filled 2013-06-13: qty 10

## 2013-06-13 MED ORDER — ACETAMINOPHEN 325 MG PO TABS
650.0000 mg | ORAL_TABLET | Freq: Once | ORAL | Status: AC
  Administered 2013-06-13: 650 mg via ORAL

## 2013-06-13 MED ORDER — TRASTUZUMAB CHEMO INJECTION 440 MG
6.0000 mg/kg | Freq: Once | INTRAVENOUS | Status: AC
  Administered 2013-06-13: 378 mg via INTRAVENOUS
  Filled 2013-06-13: qty 18

## 2013-06-13 NOTE — Telephone Encounter (Signed)
appts made and printed...td 

## 2013-06-13 NOTE — Patient Instructions (Signed)
[facility] Discharge Instructions for Patients Receiving Chemotherapy  Today you received the following chemotherapy agents :  Herceptin.  To help prevent nausea and vomiting after your treatment, we encourage you to take your nausea medication as instructed by your physician.   If you develop nausea and vomiting that is not controlled by your nausea medication, call the clinic.   BELOW ARE SYMPTOMS THAT SHOULD BE REPORTED IMMEDIATELY:  *FEVER GREATER THAN 100.5 F  *CHILLS WITH OR WITHOUT FEVER  NAUSEA AND VOMITING THAT IS NOT CONTROLLED WITH YOUR NAUSEA MEDICATION  *UNUSUAL SHORTNESS OF BREATH  *UNUSUAL BRUISING OR BLEEDING  TENDERNESS IN MOUTH AND THROAT WITH OR WITHOUT PRESENCE OF ULCERS  *URINARY PROBLEMS  *BOWEL PROBLEMS  UNUSUAL RASH Items with * indicate a potential emergency and should be followed up as soon as possible.  Feel free to call the clinic you have any questions or concerns. The clinic phone number is [phone].

## 2013-06-13 NOTE — Progress Notes (Signed)
Pt refused Benadryl as pre meds today due to pt driving.

## 2013-06-13 NOTE — Progress Notes (Signed)
Subjective:     Patient ID: Heather [name], female   DOB: 1945/12/15, 68 y.o.   MRN: [id number]  HPI The patient is a 68 year old white female who is one year and 3 months status post right modified radical mastectomy for a T2 N1 right breast cancer. She was ER positive and HER-2 positive. She has finished her chemotherapy course. She continues to take letrozole and is tolerating that well. She is ready to have her port removed.  Review of Systems  Constitutional: Negative.   HENT: Negative.   Eyes: Negative.   Respiratory: Negative.   Cardiovascular: Negative.   Gastrointestinal: Negative.   Endocrine: Negative.   Genitourinary: Negative.   Musculoskeletal: Negative.   Skin: Negative.   Allergic/Immunologic: Negative.   Neurological: Negative.   Hematological: Negative.   Psychiatric/Behavioral: Negative.        Objective:   Physical Exam  Constitutional: She is oriented to person, place, and time. She appears well-developed and well-nourished.  HENT:  Head: Normocephalic and atraumatic.  Eyes: Conjunctivae and EOM are normal. Pupils are equal, round, and reactive to light.  Neck: Normal range of motion. Neck supple.  Cardiovascular: Normal rate, regular rhythm and normal heart sounds.   Pulmonary/Chest: Effort normal and breath sounds normal.  There is no palpable mass of the right chest wall. There is no palpable mass the left breast. There is no palpable axillary, supraclavicular, or cervical lymphadenopathy  Abdominal: Soft. Bowel sounds are normal.  Musculoskeletal: Normal range of motion.  Lymphadenopathy:    She has no cervical adenopathy.  Neurological: She is alert and oriented to person, place, and time.  Skin: Skin is warm and dry.  Psychiatric: She has a normal mood and affect. Her behavior is normal.       Assessment:     The patient is one year and 3 months status post right modified radical mastectomy for breast cancer     Plan:     At this point she  will continue to do regular self exams. She will continue to take letrozole. I'll plan to see her back in about 6 months. I've discussed with her in detail the risks and benefits of the operation and removed the port as well as some of the technical aspects and she understands and wishes to proceed. We will plan to do this for Her in the near future.

## 2013-06-13 NOTE — Progress Notes (Signed)
OFFICE PROGRESS NOTE   Heather Schwalbe, MD [address] [place] [address] Dr. Autumn Messing Dr. Arloa Koh  DIAGNOSIS: 68 year old female with invasive lobular breast cancer, stage IIB, receiving adjuvant chemotherapy  PRIOR THERAPY:  #1 patient was originally seen in the multidisciplinary breast clinic on 03/14/2012 for new diagnosis of invasive ductal carcinoma that was HER-2 positive node-positive, ER positive PR negative with Ki-67 of 44%  #2 on MRI patient also had an anterior breast mass that was to be biopsied to decide whether or not she should receive neoadjuvant chemotherapy or adjuvant chemotherapy. Patient'[name] mass was biopsied and it was positive for a malignancy that was HER-2/neu negative.  #3 patient underwent right mastectomy with axillary lymph node dissection on 05/10/12 with the final pathology revealing the largest tumor measuring 5 cm, 1.7 cm and 1.6 cm. Tumor was ER positive PR negative HER-2/neu positive with a Ki-67 of 44% 3 of 7 lymph nodes were positive for metastatic disease.  #4 [name]/p adjuvant chemotherapy consisting of Taxotere carboplatinum given every 21 days with weekly Herceptin. Total of 6 cycles of Taxotere carboplatin was administered starting on 06/14/2012.   #5 She began adjuvant every 3 week herceptin on 5/29.    #5 [name]/p Radiation therapy from 10/25/12 to 12/11/12.    #6 Patient started on curative intent Letrozole on 02/07/2013.  CURRENT THERAPY:  Letrozole daily  INTERVAL HISTORY: Heather [name] is here for f/u. Patient is doing well overall. She is here for her last dose of Herceptin today. She has completed a year of adjuvant Herceptin. She denies any fevers chills night sweats headaches shortness of breath chest pains palpitations. She occasionally does get pain at the mastectomy site but there are no lumps or bumps. She is up-to-date on her mammograms. She denies having any peripheral paresthesias. No diarrhea or constipation. No  bleeding problems or easy bruising. Remainder of the 10 point review of systems is negative MEDICAL HISTORY: Past Medical History  Diagnosis Date  . Chronic kidney disease   . Hx: UTI (urinary tract infection)   . Right shoulder pain   . Right knee pain   . Hypertension     recently started Aldactone   . Hyperlipidemia     but not on meds;diet and exercise controlled  . Dizziness     has been going on for 80month;medical MD aware  . H/O hiatal hernia   . GERD (gastroesophageal reflux disease)     Tums prn  . Hemorrhoids   . Urinary frequency     d/t taking Aldactone  . History of UTI   . Anemia     hx of  . Cataract     immature-not sure of which eye  . Anxiety     r/t updated surgery  . Insomnia     takes Melatoniin daily  . Complication of anesthesia     pt states she is sensitive to meds  . Breast cancer     right  . Skin cancer   . Hx of radiation therapy 10/25/12- 12/11/12    right chest wall/regional lymph nodes 5040 cGy, 28 sessions, right chest wall boost 1000 cGy 1 session    ALLERGIES:  is allergic to codeine; keflex; naprosyn; and tussin.  MEDICATIONS:  Current Outpatient Prescriptions  Medication Sig Dispense Refill  . B Complex-C (SUPER B COMPLEX PO) Take 1 each by mouth daily.      . calcium carbonate (TUMS - DOSED IN MG ELEMENTAL CALCIUM) 500 MG  chewable tablet Chew 1 tablet by mouth daily.      . Cholecalciferol (VITAMIN D-3 PO) Take 4,000 Units by mouth daily.      . clindamycin (CLINDAGEL) 1 % gel Apply topically 2 (two) times daily.  30 g  0  . furosemide (LASIX) 20 MG tablet Take 1 tablet (20 mg total) by mouth daily as needed.  30 tablet  3  . letrozole (FEMARA) 2.5 MG tablet Take 1 tablet (2.5 mg total) by mouth daily.  90 tablet  12  . lidocaine-prilocaine (EMLA) cream Apply topically as needed.  30 g  7  . magnesium gluconate (MAGONATE) 500 MG tablet Take 500 mg by mouth 2 (two) times daily.      Marland Kitchen omeprazole (PRILOSEC) 40 MG capsule Take 1  capsule (40 mg total) by mouth daily.  30 capsule  6  . potassium chloride SA (K-DUR,KLOR-CON) 20 MEQ tablet Take 1 tablet (20 mEq total) by mouth daily as needed. When you take Furosemide  90 tablet  3  . Probiotic Product (PROBIOTIC DAILY PO) Take 1 each by mouth.      . spironolactone (ALDACTONE) 25 MG tablet Take 0.5 tablets (12.5 mg total) by mouth daily.  45 tablet  12  . UNABLE TO FIND Rx: L8000- Post Surgical Bra (Quantity: 6) I3382- Silicone Breast Prosthesis (Quantity: 1) Dx: 174.9; Right mastectomy  1 each  0   No current facility-administered medications for this visit.    SURGICAL HISTORY:  Past Surgical History  Procedure Laterality Date  . Cyst removed from left breast  1970  . Left wrist surgery   2004    with plate  . Colonoscopy    . Esophagogastroduodenoscopy    . Mastectomy, radical    . Mastectomy modified radical  05/10/2012    Procedure: MASTECTOMY MODIFIED RADICAL;  Surgeon: Merrie Roof, MD;  Location: [facility];  Service: General;  Laterality: Right;  RIGHT MODIFIED RADICAL MASTECTOMY  . Portacath placement  05/10/2012    Procedure: INSERTION PORT-A-CATH;  Surgeon: Merrie Roof, MD;  Location: [facility];  Service: General;  Laterality: Left;    REVIEW OF SYSTEMS:   A 10 point review of systems was conducted and is otherwise negative except for what is noted above.    Health Maintenance  Mammogram: 02/2013  Colonoscopy: 2 years ago Bone Density Scan: 01/03/13 Pap Smear: 10/2011 Eye Exam: 02/12/2013 Vitamin D Level: 01/17/13 Lipid Panel:01/17/13   PHYSICAL EXAMINATION: Blood pressure 152/82, pulse 71, temperature 98 F (36.7 C), temperature source Oral, resp. rate 18, height $RemoveBe'5\' 4"'TqNgiuIjA$  (1.626 m), weight 138 lb (62.596 kg). Body mass index is 23.68 kg/(m^2). GENERAL: Patient is a well appearing female in no acute distress HEENT:  Sclerae anicteric.  Oropharynx clear and moist. No ulcerations or evidence of oropharyngeal candidiasis. Neck is supple.  NODES:  No  cervical, supraclavicular, or axillary lymphadenopathy palpated.  BREAST EXAM:  Deferred. LUNGS:  Clear to auscultation bilaterally.  No wheezes or rhonchi. HEART:  Regular rate and rhythm. No murmur appreciated. ABDOMEN:  Soft, nontender.  Positive, normoactive bowel sounds. No organomegaly palpated. MSK:  No focal spinal tenderness to palpation. Full range of motion bilaterally in the upper extremities. EXTREMITIES:  No peripheral edema.   SKIN:  Clear with no obvious rashes or skin changes. No nail dyscrasia. NEURO:  Nonfocal. Well oriented.  Appropriate affect. ECOG PERFORMANCE STATUS: 1   LABORATORY DATA: Lab Results  Component Value Date   WBC 5.6 06/13/2013  HGB 13.2 06/13/2013   HCT 39.5 06/13/2013   MCV 85.7 06/13/2013   PLT 166 06/13/2013      Chemistry      Component Value Date/Time   NA 135* 05/23/2013 0854   NA 138 05/11/2012 0640   K 3.9 05/23/2013 0854   K 4.7 05/11/2012 0640   CL 101 10/25/2012 0946   CL 99 05/11/2012 0640   CO2 26 05/23/2013 0854   CO2 20 05/11/2012 0640   BUN 20.5 05/23/2013 0854   BUN 12 05/11/2012 0640   CREATININE 1.0 05/23/2013 0854   CREATININE 0.88 05/11/2012 0640      Component Value Date/Time   CALCIUM 9.6 05/23/2013 0854   CALCIUM 9.7 05/11/2012 0640   ALKPHOS 72 05/23/2013 0854   AST 20 05/23/2013 0854   ALT 17 05/23/2013 0854   BILITOT 0.41 05/23/2013 0854     ADDITIONAL INFORMATION: CHROMOGENIC IN-SITU HYBRIDIZATION (1C - TUMOR WITH CLIP) Interpretation HER-2/NEU BY CISH - NO AMPLIFICATION OF HER-2 DETECTED. THE RATIO OF HER-2: CEP 17 SIGNALS WAS 1.25. Reference range: Ratio: HER2:CEP17 < 1.8 - gene amplification not observed Ratio: HER2:CEP 17 1.8-2.2 - equivocal result Ratio: HER2:CEP17 > 2.2 - gene amplification observed CHROMOGENIC IN-SITU HYBRIDIZATION (1E - TUMOR WITHOUT CLIP) Interpretation: HER2/NEU BY CISH - SHOWS AMPLIFICATION BY CISH ANALYSIS. THE RATIO OF HER2: CEP 17 SIGNALS WAS 2.83. Reference range: Ratio: HER2:CEP17 < 1.8 gene  amplification not observed Ratio: HER2:CEP 17 1.8-2.2 - equivocal result Ratio: HER2:CEP17 > 2.2 - gene amplification observed Enid Cutter MD Pathologist, Electronic Signature ( Signed 05/15/2012) FINAL DIAGNOSIS Diagnosis Breast, modified radical mastectomy , right, with axillary contents 1 of 4 FINAL for Heather [name], Heather [name] ([id number]) Diagnosis(continued) TUMOR #1 (WITH CLIP): - INVASIVE LOBULAR CARCINOMA, GRADE II, SEE COMMENT. - LYMPHOVASCULAR INVASION IDENTIFIED. - PREVIOUS BIOPSY SITE IDENTIFIED. TUMOR #2 (NO CLIP): - INVASIVE LOBULAR CARCINOMA, GRADE II, SEE COMMENT. - LOBULAR CARCINOMA IN SITU. - LYMPHOVASCULAR INVASION IDENTIFIED. - THREE LYMPH NODES, POSITIVE FOR METASTATIC MAMMARY CARCINOMA (3/7). - TUMOR DEPOSITS ARE 1.7, 1.6, AND 5 CM. - EXTRACAPSULAR TUMOR EXTENSION IDENTIFIED. - TWO LYMPH NODES, POSITIVE FOR ISOLATED TUMOR CELLS. Microscopic Comment BREAST, INVASIVE TUMOR, WITH LYMPH NODE SAMPLING Specimen, including laterality: Right breast Procedure: Modified radical mastectomy Grade: Tumor 1 and tumor 2: Grade II of III Tubule formation: 3 Nuclear pleomorphism: 2 Mitotic: 1 Tumor size (gross measurement: Tumor #1- 2.3 cm; tumor #2 - 2.4 cm Margins: Invasive, distance to closest margin: Tumor #1 2.7 cm - deep margin; tumor #2 - 1.6 cm - deep margin Lymphovascular invasion: Present Ductal carcinoma in situ: Absent Grade: N/A Extensive intraductal component: N/A Lobular neoplasia: Both atypical hyperplasia and in situ carcinoma Extent of tumor: Skin: Grossly negative Nipple: Grossly negative Skeletal muscle: Microscopically negative Lymph nodes: # examined: 7 Lymph nodes with metastasis: 3 Isolated tumor cells (< 0.2 mm): two lymph nodes (Slide 1P and 1O) Micrometastasis: (> 0.2 mm and < 2.0 mm): none Macrometastasis: (> 2.0 mm): 1.7 cm , 1.6 cm and 5 cm Extracapsular extension: Present Breast prognostic profile: Tumor #1: Estrogen receptor: Not repeated,  previous study demonstrated 98% positivity ([id number]) Progesterone receptor: Not repeated, previous study demonstrated 0% positivity ([id number]) Her 2 neu: Repeated, previous study demonstrated no amplification (1.38) ([id number]) Ki-67: Not repeated, previous study demonstrated 5% proliferation rate ([id number]) Tumor #2: Estrogen receptor: Not repeated, previous study demonstrated 97% positivity ([id number]) Progesterone receptor: Not repeated, previous study demonstrated 0% positivity ([id number]) Her 2 neu: Repeated, previous study demonstrated amplification (3.65) ([id number]) Ki-67: Not  repeated, previous study demonstrated 44% proliferation rate ([id number]) 2 of 4 FINAL for Heather [name], Heather [name] ([id number]) Microscopic Comment(continued) Non-neoplastic breast: Benign fibrocystic change with usual ductal hyperplasia, sclerosing adenosis, benign radial sclerosing lesion, and microcalcifications in benign ducts and lobules. TNM: mpT2, pN1a, pMX. Comments: none  RADIOGRAPHIC STUDIES:  Mr Kizzie Fantasia Contrast  03/23/2012  *RADIOLOGY REPORT*  Clinical Data: New diagnosis breast cancer.  Rule out metastatic disease.  MRI HEAD WITHOUT AND WITH CONTRAST  Technique:  Multiplanar, multiecho pulse sequences of the brain and surrounding structures were obtained according to standard protocol without and with intravenous contrast  Contrast: 14mL MULTIHANCE GADOBENATE DIMEGLUMINE 529 MG/ML IV SOLN  Comparison: None.  Findings: Ventricle size is normal.  Small 4 mm hyperintensities in the cerebral white matter bilaterally do not enhance and are most likely due to chronic microvascular ischemia.  Negative for acute infarct.  Diffusion weighted imaging is normal.  Pituitary is normal in size.  Negative for intracranial hemorrhage.  Following contrast infusion, no enhancing lesions are identified.  Paranasal sinuses are clear.  Vessels at the base of the brain are patent.  IMPRESSION: Negative for  metastatic disease.  No acute abnormality.   Original Report Authenticated By: Carl Best, M.D.    Mr Breast Bilateral W Wo Contrast  03/12/2012  *RADIOLOGY REPORT*  Clinical Data: Recently diagnosed invasive mammary carcinoma in the 10 o'clock region of the right breast with a metastatic right axillary lymph node.  BILATERAL BREAST MRI WITH AND WITHOUT CONTRAST  Technique: Multiplanar, multisequence MR images of both breasts were obtained prior to and following the intravenous administration of 63ml of multihance.  Three dimensional images were evaluated at the independent DynaCad workstation.  Comparison:  Mammograms dated 03/05/2012, 03/01/2012, 03/01/2011 and 02/26/2010 from [facility].  Findings: There is a moderate background parenchymal enhancement pattern.  Right breast: 1.  There is a lobulated 2.2 x 1.7 x 1.8 cm mass in the posterior third of the upper outer quadrant of the right breast.  There is a post biopsy clip artifact 1 cm lateral to the mass. 2.  In the anterior third upper outer quadrant of the right breast there is patchy non mass like enhancement measuring 0.8 x 0.3 x 0.3 cm.  Left breast:  There is no abnormal enhancement the left breast.  There are enlarged right axillary lymph nodes.  The largest right axillary lymph node measures 2.4 cm and is located lateral to the pectoralis minor consistent with level I adenopathy.  There is a 1.6 cm lymph node behind the pectoralis minor consistent with enlarged level II adenopathy.  IMPRESSION:  1.  2.2 cm mass in the posterior third of the upper outer quadrant of the right breast corresponding with the known invasive mammary carcinoma. 2.  Patchy non mass-like enhancement in the anterior third of the upper outer quadrant of the right breast.  MR guided core biopsy is recommended. 3. Enlarged level I and II right axillary adenopathy.  RECOMMENDATION: MR guided core biopsy of the right breast.  THREE-DIMENSIONAL MR IMAGE RENDERING ON  INDEPENDENT WORKSTATION:  Three-dimensional MR images were rendered by post-processing of the original MR data on an independent workstation.  The three- dimensional MR images were interpreted, and findings were reported in the accompanying complete MRI report for this study.  BI-RADS CATEGORY 4:  Suspicious abnormality - biopsy should be considered.   Original Report Authenticated By: Lillia Mountain, M.D.    Mr Biopsy/wire Localization  03/20/2012  *RADIOLOGY REPORT*  Clinical  Data:  Known right breast carcinoma.  Additional area of worrisome enhancement located within the anterior upper outer quadrant of the right breast.  MRI GUIDED VACUUM ASSISTED BIOPSY OF THE RIGHT BREAST WITHOUT AND WITH CONTRAST  Comparison: Previous exams.  Technique: Multiplanar, multisequence MR images of the right breast were obtained prior to and following the intravenous administration of 12 ml of Mulithance.  I met with the patient, and we discussed the procedure of MRI guided biopsy, including risks, benefits, and alternatives. Specifically, we discussed the risks of infection, bleeding, tissue injury, clip migration, and inadequate sampling.  Informed, written consent was given.  Using sterile technique, 2% Lidocaine, MRI guidance, and a 9 gauge vacuum assisted device, biopsy was performed of the area of enhancement located within the anterior portion of the upper-outer quadrant - right breast using a lateral approach.  At the conclusion of the procedure, a  tissue marker clip was deployed into the biopsy cavity.  IMPRESSION: MRI guided biopsy of the area of enhancement located within the anterior portion of the upper-outer quadrant of the right breast as discussed above. No apparent complications.  BI-RADS CATEGORY 6:  Known biopsy-proven malignancy - appropriate action should be taken.  THREE-DIMENSIONAL MR IMAGE RENDERING ON INDEPENDENT WORKSTATION:  Three-dimensional MR images were rendered by post-processing of the original MR  data on an independent workstation.  The three- dimensional MR images were interpreted, and findings were reported in the accompanying complete MRI report for this study.  The pathology associated with the right breast MR guided core biopsy demonstrated invasive mammary carcinoma and in situ mammary carcinoma.  This is concordant with imaging findings.  I have discussed findings with the patient by telephone and answered her questions.  The patient states that biopsy site is clean and dry without hematoma formation or signs of infection.  Post biopsy wound care instructions were reviewed with the patient.  Follow-up will be with Dr. Marlou Starks and Dr. Humphrey Rolls regards her treatment plan.  The patient was encouraged to call the Breast Center for additional questions or concerns   Original Report Authenticated By: Altamese Cabal, M.D.    Nm Pet Image Initial (pi) Skull Base To Thigh  03/22/2012  *RADIOLOGY REPORT*  Clinical Data: Initial treatment strategy for high risk right breast cancer.  NUCLEAR MEDICINE PET SKULL BASE TO THIGH  Fasting Blood Glucose:  100  Technique:  18.6 mCi F-18 FDG was injected intravenously. CT data was obtained and used for attenuation correction and anatomic localization only.  (This was not acquired as a diagnostic CT examination.) Additional exam technical data entered on technologist worksheet.  Comparison:  MRI breast dated 03/12/2012  Findings:  Neck: No hypermetabolic lymph nodes in the neck.  Chest:  Vague hypermetabolism in the upper/central right breast, max SUV 2.7.  Additional focal hypermetabolism in the upper/outer right breast, max SUV 2.3 (PET image 105).  These findings correspond to biopsy-proven primary breast carcinoma.  1.3 cm short axis right subpectoral lymph node (series 2/image 66), max SUV 3.1.  Right axillary lymphadenopathy measuring up to 1.6 cm short axis (series 2/image 88), max SUV 4.8).  No hypermetabolic mediastinal or hilar nodes.  No suspicious pulmonary  nodules on the CT scan.  Abdomen/Pelvis:  No abnormal hypermetabolic activity within the liver, pancreas, adrenal glands, or spleen.  No hypermetabolic lymph nodes in the abdomen or pelvis.  Skeleton:  No focal hypermetabolic activity to suggest skeletal metastasis.  IMPRESSION: Two foci of hypermetabolism in the upper right breast, max SUV 2.7,  corresponding to biopsy-proven primary breast carcinoma.  Associated right subpectoral and axillary nodal metastases, max SUV 4.8.  No evidence of distant metastases.   Original Report Authenticated By: Julian Hy, M.D.    Mm Digital Diagnostic Unilat R  03/20/2012  *RADIOLOGY REPORT*  Clinical Data:  Post right breast MR guided core biopsy.  DIGITAL DIAGNOSTIC RIGHT BREAST MAMMOGRAM  Comparison:  03/05/2012, 03/01/2012, 03/01/2011, 02/26/2010 mammograms from [facility] imaging.  Findings:  Films are performed following MR guided biopsy of the anterior portion of the upper-outer quadrant of the right breast. The hourglass shaped clip placed during the MR guided core biopsy appears in appropriate position.  IMPRESSION: Appropriate positioning of clip following right breast MR guided core biopsy.   Original Report Authenticated By: Altamese Cabal, M.D.     ASSESSMENT/PLAN: 68 year old [place], [place] woman  #1  status post mastectomy with right axillary lymph node dissection 05/10/2012 for an mpT2 N1, stage stage IIB invasive lobular breast cancer, stage IIB, ER positive, HER-2/neu positive, with an Mib-1 between 5% and 44%  #2 [name]/P adjuvant chemotherapy consisting of Taxotere, carboplatinum and Herceptin given every 21 days for total of 6 cycles of Taxotere carboplatinum and weekly Herceptin beginning on 06/14/12.  Adjuvant every three week herceptin from 10/04/12 - 06/13/13, [name]/P radiation therapy from on 10/25/12 through 12/11/12.    Patient was begun on adjuvant letrozole 2.5 mg daily. She is tolerating it very nicely.  #3 we discussed survivorship: We discussed self  breast examinations, physical exercise healthy diet and maintaining a good BMI.  #4 patient also discussed the possibility of having right breast reconstruction done in the near future. We will continue to address this. Patient to have her port removed. She does have an upcoming appointment with Dr. Marlou Starks and I have sent a message regarding removal of the port  #5 I will see the patient back in 4 months time in followup.   All questions were answered. The patient knows to call the clinic with any problems, questions or concerns. We can certainly see the patient much sooner if necessary.   I spent 25 minutes counseling the patient face to face. The total time spent in the appointment was 30 minutes.  Marcy Panning, MD Medical/Oncology [facility] [phone] (beeper) [phone] (Office)  06/13/2013, 8:31 AM

## 2013-06-27 DIAGNOSIS — Z452 Encounter for adjustment and management of vascular access device: Secondary | ICD-10-CM

## 2013-06-27 DIAGNOSIS — Z853 Personal history of malignant neoplasm of breast: Secondary | ICD-10-CM | POA: Diagnosis not present

## 2013-06-28 ENCOUNTER — Other Ambulatory Visit (INDEPENDENT_AMBULATORY_CARE_PROVIDER_SITE_OTHER): Payer: Self-pay | Admitting: *Deleted

## 2013-06-28 MED ORDER — OXYCODONE-ACETAMINOPHEN 5-325 MG PO TABS: 1.0000 | ORAL_TABLET | ORAL | Status: DC | PRN

## 2013-09-09 ENCOUNTER — Telehealth (INDEPENDENT_AMBULATORY_CARE_PROVIDER_SITE_OTHER): Payer: Self-pay

## 2013-09-09 NOTE — Telephone Encounter (Signed)
Pt calling to get a updated Rx for a compression sleeve Faxed to [place]. Pt states that [facility] told her that due to her swelling she may need a compression 20/30 or a 30/40 that they would have to fit her the day that she comes to receive.

## 2013-09-10 NOTE — Telephone Encounter (Signed)
Script faxed.

## 2013-09-24 ENCOUNTER — Telehealth: Payer: Self-pay | Admitting: Adult Health

## 2013-09-24 NOTE — Telephone Encounter (Signed)
, °

## 2013-10-11 ENCOUNTER — Ambulatory Visit: Payer: Medicare Other | Admitting: Oncology

## 2013-10-11 ENCOUNTER — Other Ambulatory Visit: Payer: Medicare Other

## 2013-10-18 DIAGNOSIS — C50919 Malignant neoplasm of unspecified site of unspecified female breast: Secondary | ICD-10-CM | POA: Diagnosis not present

## 2013-10-18 DIAGNOSIS — I129 Hypertensive chronic kidney disease with stage 1 through stage 4 chronic kidney disease, or unspecified chronic kidney disease: Secondary | ICD-10-CM | POA: Diagnosis not present

## 2013-10-18 DIAGNOSIS — N183 Chronic kidney disease, stage 3 unspecified: Secondary | ICD-10-CM | POA: Diagnosis not present

## 2013-10-18 DIAGNOSIS — Z23 Encounter for immunization: Secondary | ICD-10-CM | POA: Diagnosis not present

## 2013-10-18 DIAGNOSIS — Z1159 Encounter for screening for other viral diseases: Secondary | ICD-10-CM | POA: Diagnosis not present

## 2013-10-18 DIAGNOSIS — M899 Disorder of bone, unspecified: Secondary | ICD-10-CM | POA: Diagnosis not present

## 2013-10-18 DIAGNOSIS — Z0000 Encounter for general adult medical examination without abnormal findings: Secondary | ICD-10-CM | POA: Diagnosis not present

## 2013-10-18 DIAGNOSIS — E785 Hyperlipidemia, unspecified: Secondary | ICD-10-CM | POA: Diagnosis not present

## 2013-10-18 DIAGNOSIS — M949 Disorder of cartilage, unspecified: Secondary | ICD-10-CM | POA: Diagnosis not present

## 2013-11-14 ENCOUNTER — Ambulatory Visit (HOSPITAL_BASED_OUTPATIENT_CLINIC_OR_DEPARTMENT_OTHER): Payer: Medicare Other | Admitting: Adult Health

## 2013-11-14 ENCOUNTER — Telehealth: Payer: Self-pay | Admitting: Adult Health

## 2013-11-14 ENCOUNTER — Encounter: Payer: Self-pay | Admitting: Adult Health

## 2013-11-14 ENCOUNTER — Other Ambulatory Visit (HOSPITAL_BASED_OUTPATIENT_CLINIC_OR_DEPARTMENT_OTHER): Payer: Medicare Other

## 2013-11-14 VITALS — BP 158/80 | HR 76 | Temp 99.0°F | Resp 18 | Ht 64.0 in | Wt 135.3 lb

## 2013-11-14 DIAGNOSIS — C50419 Malignant neoplasm of upper-outer quadrant of unspecified female breast: Secondary | ICD-10-CM

## 2013-11-14 DIAGNOSIS — M2550 Pain in unspecified joint: Secondary | ICD-10-CM

## 2013-11-14 DIAGNOSIS — Z170 Estrogen receptor positive status [ER+]: Secondary | ICD-10-CM

## 2013-11-14 DIAGNOSIS — N951 Menopausal and female climacteric states: Secondary | ICD-10-CM

## 2013-11-14 DIAGNOSIS — M79609 Pain in unspecified limb: Secondary | ICD-10-CM | POA: Diagnosis not present

## 2013-11-14 DIAGNOSIS — C50411 Malignant neoplasm of upper-outer quadrant of right female breast: Secondary | ICD-10-CM

## 2013-11-14 LAB — CBC WITH DIFFERENTIAL/PLATELET
BASO%: 0.6 % (ref 0.0–2.0)
Basophils Absolute: 0 10*3/uL (ref 0.0–0.1)
EOS%: 1.5 % (ref 0.0–7.0)
Eosinophils Absolute: 0.1 10*3/uL (ref 0.0–0.5)
HCT: 39.2 % (ref 34.8–46.6)
HGB: 13 g/dL (ref 11.6–15.9)
LYMPH%: 18.4 % (ref 14.0–49.7)
MCH: 29.1 pg (ref 25.1–34.0)
MCHC: 33.2 g/dL (ref 31.5–36.0)
MCV: 87.8 fL (ref 79.5–101.0)
MONO#: 0.3 10*3/uL (ref 0.1–0.9)
MONO%: 6.3 % (ref 0.0–14.0)
NEUT#: 3.7 10*3/uL (ref 1.5–6.5)
NEUT%: 73.2 % (ref 38.4–76.8)
Platelets: 184 10*3/uL (ref 145–400)
RBC: 4.47 10*6/uL (ref 3.70–5.45)
RDW: 13.5 % (ref 11.2–14.5)
WBC: 5.1 10*3/uL (ref 3.9–10.3)
lymph#: 0.9 10*3/uL (ref 0.9–3.3)

## 2013-11-14 LAB — COMPREHENSIVE METABOLIC PANEL (CC13)
ALT: 18 U/L (ref 0–55)
AST: 22 U/L (ref 5–34)
Albumin: 4.1 g/dL (ref 3.5–5.0)
Alkaline Phosphatase: 75 U/L (ref 40–150)
Anion Gap: 11 mEq/L (ref 3–11)
BUN: 12.9 mg/dL (ref 7.0–26.0)
CO2: 29 mEq/L (ref 22–29)
Calcium: 10.1 mg/dL (ref 8.4–10.4)
Chloride: 98 mEq/L (ref 98–109)
Creatinine: 1.2 mg/dL — ABNORMAL HIGH (ref 0.6–1.1)
Glucose: 99 mg/dl (ref 70–140)
Potassium: 4.2 mEq/L (ref 3.5–5.1)
Sodium: 138 mEq/L (ref 136–145)
Total Bilirubin: 0.54 mg/dL (ref 0.20–1.20)
Total Protein: 7.3 g/dL (ref 6.4–8.3)

## 2013-11-14 NOTE — Telephone Encounter (Signed)
per pof to schpt appt-sch refferal to brinda Thimmappa-gave pt time & ate for appt 7/15 @10 :00-gave pt copy of sch-auth reff

## 2013-11-14 NOTE — Patient Instructions (Signed)
You are doing well.  You have no sign of recurrence.  Continue taking Letrozole daily.  Let me know if your lower back pain doesn't improve.  [name] will see you in 6 months time.  I have referred you to see Dr. Leland Johns in plastic surgery.  Continue with a healthy diet, exercise, and monthly self breast exams.     Please call us if you have any questions or concerns.    Breast Self-Awareness Practicing breast self-awareness may pick up problems early, prevent significant medical complications, and possibly save your life. By practicing breast self-awareness, you can become familiar with how your breasts look and feel and if your breasts are changing. This allows you to notice changes early. It can also offer you some reassurance that your breast health is good. One way to learn what is normal for your breasts and whether your breasts are changing is to do a breast self-exam. If you find a lump or something that was not present in the past, it is best to contact your caregiver right away. Other findings that should be evaluated by your caregiver include nipple discharge, especially if it is bloody; skin changes or reddening; areas where the skin seems to be pulled in (retracted); or new lumps and bumps. Breast pain is seldom associated with cancer (malignancy), but should also be evaluated by a caregiver. HOW TO PERFORM A BREAST SELF-EXAM The best time to examine your breasts is 5-7 days after your menstrual period is over. During menstruation, the breasts are lumpier, and it may be more difficult to pick up changes. If you do not menstruate, have reached menopause, or had your uterus removed (hysterectomy), you should examine your breasts at regular intervals, such as monthly. If you are breastfeeding, examine your breasts after a feeding or after using a breast pump. Breast implants do not decrease the risk for lumps or tumors, so continue to perform breast self-exams as recommended. Talk to your  caregiver about how to determine the difference between the implant and breast tissue. Also, talk about the amount of pressure you should use during the exam. Over time, you will become more familiar with the variations of your breasts and more comfortable with the exam. A breast self-exam requires you to remove all your clothes above the waist. 1. Look at your breasts and nipples. Stand in front of a mirror in a room with good lighting. With your hands on your hips, push your hands firmly downward. Look for a difference in shape, contour, and size from one breast to the other (asymmetry). Asymmetry includes puckers, dips, or bumps. Also, look for skin changes, such as reddened or scaly areas on the breasts. Look for nipple changes, such as discharge, dimpling, repositioning, or redness. 2. Carefully feel your breasts. This is best done either in the shower or tub while using soapy water or when flat on your back. Place the arm (on the side of the breast you are examining) above your head. Use the pads (not the fingertips) of your three middle fingers on your opposite hand to feel your breasts. Start in the underarm area and use  inch (2 cm) overlapping circles to feel your breast. Use 3 different levels of pressure (light, medium, and firm pressure) at each circle before moving to the next circle. The light pressure is needed to feel the tissue closest to the skin. The medium pressure will help to feel breast tissue a little deeper, while the firm pressure is needed to  feel the tissue close to the ribs. Continue the overlapping circles, moving downward over the breast until you feel your ribs below your breast. Then, move one finger-width towards the center of the body. Continue to use the  inch (2 cm) overlapping circles to feel your breast as you move slowly up toward the collar bone (clavicle) near the base of the neck. Continue the up and down exam using all 3 pressures until you reach the middle of the  chest. Do this with each breast, carefully feeling for lumps or changes. 3.  Keep a written record with breast changes or normal findings for each breast. By writing this information down, you do not need to depend only on memory for size, tenderness, or location. Write down where you are in your menstrual cycle, if you are still menstruating. Breast tissue can have some lumps or thick tissue. However, see your caregiver if you find anything that concerns you.  SEEK MEDICAL CARE IF:  You see a change in shape, contour, or size of your breasts or nipples.   You see skin changes, such as reddened or scaly areas on the breasts or nipples.   You have an unusual discharge from your nipples.   You feel a new lump or unusually thick areas.  Document Released: 04/25/2005 Document Revised: 04/11/2012 Document Reviewed: 08/10/2011 [facility] Patient Information 2015 [place], [place]. This information is not intended to replace advice given to you by your health care provider. Make sure you discuss any questions you have with your health care provider.

## 2013-11-14 NOTE — Progress Notes (Signed)
OFFICE PROGRESS NOTE   Vidal Schwalbe, MD [address], [address] [place] [address] Dr. Autumn Messing Dr. Arloa Koh  DIAGNOSIS: 68 year old female with invasive lobular breast cancer, stage IIB  PRIOR THERAPY:  #1 patient was originally seen in the multidisciplinary breast clinic on 03/14/2012 for new diagnosis of invasive ductal carcinoma that was HER-2 positive node-positive, ER positive PR negative with Ki-67 of 44%  #2 on MRI patient also had an anterior breast mass that was to be biopsied to decide whether or not she should receive neoadjuvant chemotherapy or adjuvant chemotherapy. Patient's mass was biopsied and it was positive for a malignancy that was HER-2/neu negative.  #3 patient underwent right mastectomy with axillary lymph node dissection on 05/10/12 with the final pathology revealing the largest tumor measuring 5 cm, 1.7 cm and 1.6 cm. Tumor was ER positive PR negative HER-2/neu positive with a Ki-67 of 44% 3 of 7 lymph nodes were positive for metastatic disease.  #4 S/p adjuvant chemotherapy consisting of Taxotere carboplatinum given every 21 days with weekly Herceptin. Total of 6 cycles of Taxotere carboplatin was administered starting on 06/14/2012.   #5 She began adjuvant every 3 week herceptin on 5/29 and has completed a calendar year of Herceptin therapy.      #5 s/p Radiation therapy from 10/25/12 to 12/11/12.    #6 Patient started on curative intent Letrozole on 02/07/2013.  CURRENT THERAPY:  Letrozole daily  INTERVAL HISTORY: Ms. Lucking is here for f/u. Patient is doing well overall. She is taking Letrozole daily and is tolerating it well.  She has moderate joint aches, mild hot flashes and vaginal dryness.  Her toes are aching and this is new for her.  She was recommended vitamin e capsules inserted into her vagina PRN.  The neuropathy in her hands and feet has improved.  She does want to talk about reconstruction and is requesting a referral to a Pension scheme manager.  She does work out a lot at [employer] and has niticed increased pain and stiffness in her knees bilaterally.  Otherwise she is doing well and a 10 point ROS is neg.  We reviewed her health maintenance below.  MEDICAL HISTORY: Past Medical History  Diagnosis Date  . Chronic kidney disease   . Hx: UTI (urinary tract infection)   . Right shoulder pain   . Right knee pain   . Hypertension     recently started Aldactone   . Hyperlipidemia     but not on meds;diet and exercise controlled  . Dizziness     has been going on for 41month;medical MD aware  . H/O hiatal hernia   . GERD (gastroesophageal reflux disease)     Tums prn  . Hemorrhoids   . Urinary frequency     d/t taking Aldactone  . History of UTI   . Anemia     hx of  . Cataract     immature-not sure of which eye  . Anxiety     r/t updated surgery  . Insomnia     takes Melatoniin daily  . Complication of anesthesia     pt states she is sensitive to meds  . Breast cancer     right  . Skin cancer   . Hx of radiation therapy 10/25/12- 12/11/12    right chest wall/regional lymph nodes 5040 cGy, 28 sessions, right chest wall boost 1000 cGy 1 session    ALLERGIES:  is allergic to codeine; keflex; naprosyn; and tussin.  MEDICATIONS:  Current Outpatient Prescriptions  Medication Sig Dispense Refill  . aspirin 81 MG tablet Take 81 mg by mouth daily.      . B Complex-C (SUPER B COMPLEX PO) Take 1 each by mouth daily.      . calcium carbonate (TUMS - DOSED IN MG ELEMENTAL CALCIUM) 500 MG chewable tablet Chew 1 tablet by mouth daily.      . Cholecalciferol (VITAMIN D-3 PO) Take 4,000 Units by mouth daily.      Marland Kitchen letrozole (FEMARA) 2.5 MG tablet Take 1 tablet (2.5 mg total) by mouth daily.  90 tablet  12  . magnesium gluconate (MAGONATE) 500 MG tablet Take 500 mg by mouth 2 (two) times daily.      . Probiotic Product (PROBIOTIC DAILY PO) Take 1 each by mouth.      . spironolactone (ALDACTONE) 25 MG tablet Take 0.5 tablets  (12.5 mg total) by mouth daily.  45 tablet  12  . UNABLE TO FIND Rx: L8000- Post Surgical Bra (Quantity: 6) V3748- Silicone Breast Prosthesis (Quantity: 1) Dx: 174.9; Right mastectomy  1 each  0  . furosemide (LASIX) 20 MG tablet Take 1 tablet (20 mg total) by mouth daily as needed.  30 tablet  3  . omeprazole (PRILOSEC) 40 MG capsule Take 1 capsule (40 mg total) by mouth daily.  30 capsule  6  . oxyCODONE-acetaminophen (ROXICET) 5-325 MG per tablet Take 1-2 tablets by mouth every 4 (four) hours as needed for severe pain (Given at discharge from [facility]).  30 tablet  0  . potassium chloride SA (K-DUR,KLOR-CON) 20 MEQ tablet Take 1 tablet (20 mEq total) by mouth daily as needed. When you take Furosemide  90 tablet  3   No current facility-administered medications for this visit.    SURGICAL HISTORY:  Past Surgical History  Procedure Laterality Date  . Cyst removed from left breast  1970  . Left wrist surgery   2004    with plate  . Colonoscopy    . Esophagogastroduodenoscopy    . Mastectomy, radical    . Mastectomy modified radical  05/10/2012    Procedure: MASTECTOMY MODIFIED RADICAL;  Surgeon: Merrie Roof, MD;  Location: [facility];  Service: General;  Laterality: Right;  RIGHT MODIFIED RADICAL MASTECTOMY  . Portacath placement  05/10/2012    Procedure: INSERTION PORT-A-CATH;  Surgeon: Merrie Roof, MD;  Location: [facility];  Service: General;  Laterality: Left;    REVIEW OF SYSTEMS:   A 10 point review of systems was conducted and is otherwise negative except for what is noted above.    Health Maintenance  Mammogram: 02/2013  Colonoscopy: 2 years ago Bone Density Scan: 01/03/13 Pap Smear: 10/2011 Eye Exam: 02/12/2013 Vitamin D Level: 01/17/13 Lipid Panel:01/17/13   PHYSICAL EXAMINATION: Blood pressure 158/80, pulse 76, temperature 99 F (37.2 C), temperature source Oral, resp. rate 18, height _0  (1.626 m), weight 135 lb 4.8 oz (61.372 kg). Body mass index is 23.21 kg/(m^2). GENERAL:  Patient is a well appearing female in no acute distress HEENT:  Sclerae anicteric.  Oropharynx clear and moist. No ulcerations or evidence of oropharyngeal candidiasis. Neck is supple.  NODES:  No cervical, supraclavicular, or axillary lymphadenopathy palpated.  BREAST EXAM:  S/p right mastectomy without nodularity, no sign of recurrence, left breast without mass or nodules, benign bilateral breast exam.  LUNGS:  Clear to auscultation bilaterally.  No wheezes or rhonchi. HEART:  Regular rate and rhythm. No murmur appreciated.  ABDOMEN:  Soft, nontender.  Positive, normoactive bowel sounds. No organomegaly palpated. MSK:  No focal spinal tenderness to palpation. Full range of motion bilaterally in the upper extremities. EXTREMITIES:  No peripheral edema.   SKIN:  Clear with no obvious rashes or skin changes. No nail dyscrasia. NEURO:  Nonfocal. Well oriented.  Appropriate affect. ECOG PERFORMANCE STATUS: 1   LABORATORY DATA: Lab Results  Component Value Date   WBC 5.1 11/14/2013   HGB 13.0 11/14/2013   HCT 39.2 11/14/2013   MCV 87.8 11/14/2013   PLT 184 11/14/2013      Chemistry      Component Value Date/Time   NA 138 11/14/2013 1316   NA 138 05/11/2012 0640   K 4.2 11/14/2013 1316   K 4.7 05/11/2012 0640   CL 101 10/25/2012 0946   CL 99 05/11/2012 0640   CO2 29 11/14/2013 1316   CO2 20 05/11/2012 0640   BUN 12.9 11/14/2013 1316   BUN 12 05/11/2012 0640   CREATININE 1.2* 11/14/2013 1316   CREATININE 0.88 05/11/2012 0640      Component Value Date/Time   CALCIUM 10.1 11/14/2013 1316   CALCIUM 9.7 05/11/2012 0640   ALKPHOS 75 11/14/2013 1316   AST 22 11/14/2013 1316   ALT 18 11/14/2013 1316   BILITOT 0.54 11/14/2013 1316     ADDITIONAL INFORMATION: CHROMOGENIC IN-SITU HYBRIDIZATION (1C - TUMOR WITH CLIP) Interpretation HER-2/NEU BY CISH - NO AMPLIFICATION OF HER-2 DETECTED. THE RATIO OF HER-2: CEP 17 SIGNALS WAS 1.25. Reference range: Ratio: HER2:CEP17 < 1.8 - gene amplification not observed Ratio: HER2:CEP 17  1.8-2.2 - equivocal result Ratio: HER2:CEP17 > 2.2 - gene amplification observed CHROMOGENIC IN-SITU HYBRIDIZATION (1E - TUMOR WITHOUT CLIP) Interpretation: HER2/NEU BY CISH - SHOWS AMPLIFICATION BY CISH ANALYSIS. THE RATIO OF HER2: CEP 17 SIGNALS WAS 2.83. Reference range: Ratio: HER2:CEP17 < 1.8 gene amplification not observed Ratio: HER2:CEP 17 1.8-2.2 - equivocal result Ratio: HER2:CEP17 > 2.2 - gene amplification observed Enid Cutter MD Pathologist, Electronic Signature ( Signed 05/15/2012) FINAL DIAGNOSIS Diagnosis Breast, modified radical mastectomy , right, with axillary contents 1 of 4 FINAL for ALLISEN, PIDGEON S ([id number]) Diagnosis(continued) TUMOR #1 (WITH CLIP): - INVASIVE LOBULAR CARCINOMA, GRADE II, SEE COMMENT. - LYMPHOVASCULAR INVASION IDENTIFIED. - PREVIOUS BIOPSY SITE IDENTIFIED. TUMOR #2 (NO CLIP): - INVASIVE LOBULAR CARCINOMA, GRADE II, SEE COMMENT. - LOBULAR CARCINOMA IN SITU. - LYMPHOVASCULAR INVASION IDENTIFIED. - THREE LYMPH NODES, POSITIVE FOR METASTATIC MAMMARY CARCINOMA (3/7). - TUMOR DEPOSITS ARE 1.7, 1.6, AND 5 CM. - EXTRACAPSULAR TUMOR EXTENSION IDENTIFIED. - TWO LYMPH NODES, POSITIVE FOR ISOLATED TUMOR CELLS. Microscopic Comment BREAST, INVASIVE TUMOR, WITH LYMPH NODE SAMPLING Specimen, including laterality: Right breast Procedure: Modified radical mastectomy Grade: Tumor 1 and tumor 2: Grade II of III Tubule formation: 3 Nuclear pleomorphism: 2 Mitotic: 1 Tumor size (gross measurement: Tumor #1- 2.3 cm; tumor #2 - 2.4 cm Margins: Invasive, distance to closest margin: Tumor #1 2.7 cm - deep margin; tumor #2 - 1.6 cm - deep margin Lymphovascular invasion: Present Ductal carcinoma in situ: Absent Grade: N/A Extensive intraductal component: N/A Lobular neoplasia: Both atypical hyperplasia and in situ carcinoma Extent of tumor: Skin: Grossly negative Nipple: Grossly negative Skeletal muscle: Microscopically negative Lymph nodes: # examined:  7 Lymph nodes with metastasis: 3 Isolated tumor cells (< 0.2 mm): two lymph nodes (Slide 1P and 1O) Micrometastasis: (> 0.2 mm and < 2.0 mm): none Macrometastasis: (> 2.0 mm): 1.7 cm , 1.6 cm and 5 cm Extracapsular extension: Present Breast prognostic  profile: Tumor #1: Estrogen receptor: Not repeated, previous study demonstrated 98% positivity ([id number]) Progesterone receptor: Not repeated, previous study demonstrated 0% positivity ([id number]) Her 2 neu: Repeated, previous study demonstrated no amplification (1.38) ([id number]) Ki-67: Not repeated, previous study demonstrated 5% proliferation rate ([id number]) Tumor #2: Estrogen receptor: Not repeated, previous study demonstrated 97% positivity ([id number]) Progesterone receptor: Not repeated, previous study demonstrated 0% positivity ([id number]) Her 2 neu: Repeated, previous study demonstrated amplification (3.65) ([id number]) Ki-67: Not repeated, previous study demonstrated 44% proliferation rate ([id number]) 2 of 4 FINAL for KEILEE, DENMAN (SZA14-4) Microscopic Comment(continued) Non-neoplastic breast: Benign fibrocystic change with usual ductal hyperplasia, sclerosing adenosis, benign radial sclerosing lesion, and microcalcifications in benign ducts and lobules. TNM: mpT2, pN1a, pMX. Comments: none  RADIOGRAPHIC STUDIES:  Mr Kizzie Fantasia Contrast  03/23/2012  *RADIOLOGY REPORT*  Clinical Data: New diagnosis breast cancer.  Rule out metastatic disease.  MRI HEAD WITHOUT AND WITH CONTRAST  Technique:  Multiplanar, multiecho pulse sequences of the brain and surrounding structures were obtained according to standard protocol without and with intravenous contrast  Contrast: 31m MULTIHANCE GADOBENATE DIMEGLUMINE 529 MG/ML IV SOLN  Comparison: None.  Findings: Ventricle size is normal.  Small 4 mm hyperintensities in the cerebral white matter bilaterally do not enhance and are most likely due to chronic microvascular ischemia.   Negative for acute infarct.  Diffusion weighted imaging is normal.  Pituitary is normal in size.  Negative for intracranial hemorrhage.  Following contrast infusion, no enhancing lesions are identified.  Paranasal sinuses are clear.  Vessels at the base of the brain are patent.  IMPRESSION: Negative for metastatic disease.  No acute abnormality.   Original Report Authenticated By: DCarl Best M.D.    Mr Breast Bilateral W Wo Contrast  03/12/2012  *RADIOLOGY REPORT*  Clinical Data: Recently diagnosed invasive mammary carcinoma in the 10 o'clock region of the right breast with a metastatic right axillary lymph node.  BILATERAL BREAST MRI WITH AND WITHOUT CONTRAST  Technique: Multiplanar, multisequence MR images of both breasts were obtained prior to and following the intravenous administration of 166mof multihance.  Three dimensional images were evaluated at the independent DynaCad workstation.  Comparison:  Mammograms dated 03/05/2012, 03/01/2012, 03/01/2011 and 02/26/2010 [facility] Findings: There is a moderate background parenchymal enhancement pattern.  Right breast: 1.  There is a lobulated 2.2 x 1.7 x 1.8 cm mass in the posterior third of the upper outer quadrant of the right breast.  There is a post biopsy clip artifact 1 cm lateral to the mass. 2.  In the anterior third upper outer quadrant of the right breast there is patchy non mass like enhancement measuring 0.8 x 0.3 x 0.3 cm.  Left breast:  There is no abnormal enhancement the left breast.  There are enlarged right axillary lymph nodes.  The largest right axillary lymph node measures 2.4 cm and is located lateral to the pectoralis minor consistent with level I adenopathy.  There is a 1.6 cm lymph node behind the pectoralis minor consistent with enlarged level II adenopathy.  IMPRESSION:  1.  2.2 cm mass in the posterior third of the upper outer quadrant of the right breast corresponding with the known invasive mammary carcinoma. 2.   Patchy non mass-like enhancement in the anterior third of the upper outer quadrant of the right breast.  MR guided core biopsy is recommended. 3. Enlarged level I and II right axillary adenopathy.  RECOMMENDATION: MR guided core biopsy of the right breast.  THREE-DIMENSIONAL  MR IMAGE RENDERING ON INDEPENDENT WORKSTATION:  Three-dimensional MR images were rendered by post-processing of the original MR data on an independent workstation.  The three- dimensional MR images were interpreted, and findings were reported in the accompanying complete MRI report for this study.  BI-RADS CATEGORY 4:  Suspicious abnormality - biopsy should be considered.   Original Report Authenticated By: Lillia Mountain, M.D.    Mr Biopsy/wire Localization  03/20/2012  *RADIOLOGY REPORT*  Clinical Data:  Known right breast carcinoma.  Additional area of worrisome enhancement located within the anterior upper outer quadrant of the right breast.  MRI GUIDED VACUUM ASSISTED BIOPSY OF THE RIGHT BREAST WITHOUT AND WITH CONTRAST  Comparison: Previous exams.  Technique: Multiplanar, multisequence MR images of the right breast were obtained prior to and following the intravenous administration of 12 ml of Mulithance.  I met with the patient, and we discussed the procedure of MRI guided biopsy, including risks, benefits, and alternatives. Specifically, we discussed the risks of infection, bleeding, tissue injury, clip migration, and inadequate sampling.  Informed, written consent was given.  Using sterile technique, 2% Lidocaine, MRI guidance, and a 9 gauge vacuum assisted device, biopsy was performed of the area of enhancement located within the anterior portion of the upper-outer quadrant - right breast using a lateral approach.  At the conclusion of the procedure, a  tissue marker clip was deployed into the biopsy cavity.  IMPRESSION: MRI guided biopsy of the area of enhancement located within the anterior portion of the upper-outer quadrant of the  right breast as discussed above. No apparent complications.  BI-RADS CATEGORY 6:  Known biopsy-proven malignancy - appropriate action should be taken.  THREE-DIMENSIONAL MR IMAGE RENDERING ON INDEPENDENT WORKSTATION:  Three-dimensional MR images were rendered by post-processing of the original MR data on an independent workstation.  The three- dimensional MR images were interpreted, and findings were reported in the accompanying complete MRI report for this study.  The pathology associated with the right breast MR guided core biopsy demonstrated invasive mammary carcinoma and in situ mammary carcinoma.  This is concordant with imaging findings.  I have discussed findings with the patient by telephone and answered her questions.  The patient states that biopsy site is clean and dry without hematoma formation or signs of infection.  Post biopsy wound care instructions were reviewed with the patient.  Follow-up will be with Dr. Marlou Starks and Dr. Humphrey Rolls regards her treatment plan.  The patient was encouraged to call the Breast Center for additional questions or concerns   Original Report Authenticated By: Altamese Cabal, M.D.    Nm Pet Image Initial (pi) Skull Base To Thigh  03/22/2012  *RADIOLOGY REPORT*  Clinical Data: Initial treatment strategy for high risk right breast cancer.  NUCLEAR MEDICINE PET SKULL BASE TO THIGH  Fasting Blood Glucose:  100  Technique:  18.6 mCi F-18 FDG was injected intravenously. CT data was obtained and used for attenuation correction and anatomic localization only.  (This was not acquired as a diagnostic CT examination.) Additional exam technical data entered on technologist worksheet.  Comparison:  MRI breast dated 03/12/2012  Findings:  Neck: No hypermetabolic lymph nodes in the neck.  Chest:  Vague hypermetabolism in the upper/central right breast, max SUV 2.7.  Additional focal hypermetabolism in the upper/outer right breast, max SUV 2.3 (PET image 105).  These findings correspond to  biopsy-proven primary breast carcinoma.  1.3 cm short axis right subpectoral lymph node (series 2/image 66), max SUV 3.1.  Right axillary lymphadenopathy measuring  up to 1.6 cm short axis (series 2/image 88), max SUV 4.8).  No hypermetabolic mediastinal or hilar nodes.  No suspicious pulmonary nodules on the CT scan.  Abdomen/Pelvis:  No abnormal hypermetabolic activity within the liver, pancreas, adrenal glands, or spleen.  No hypermetabolic lymph nodes in the abdomen or pelvis.  Skeleton:  No focal hypermetabolic activity to suggest skeletal metastasis.  IMPRESSION: Two foci of hypermetabolism in the upper right breast, max SUV 2.7, corresponding to biopsy-proven primary breast carcinoma.  Associated right subpectoral and axillary nodal metastases, max SUV 4.8.  No evidence of distant metastases.   Original Report Authenticated By: Julian Hy, M.D.    Mm Digital Diagnostic Unilat R  03/20/2012  *RADIOLOGY REPORT*  Clinical Data:  Post right breast MR guided core biopsy.  DIGITAL DIAGNOSTIC RIGHT BREAST MAMMOGRAM  Comparison:  03/05/2012, 03/01/2012, 03/01/2011, 02/26/2010 mammograms from [facility].  Findings:  Films are performed following MR guided biopsy of the anterior portion of the upper-outer quadrant of the right breast. The hourglass shaped clip placed during the MR guided core biopsy appears in appropriate position.  IMPRESSION: Appropriate positioning of clip following right breast MR guided core biopsy.   Original Report Authenticated By: Altamese Cabal, M.D.     ASSESSMENT/PLAN: 68 year old [place], [place] woman  #1  status post mastectomy with right axillary lymph node dissection 05/10/2012 for an mpT2 N1, stage stage IIB invasive lobular breast cancer, stage IIB, ER positive, HER-2/neu positive, with an Mib-1 between 5% and 44%  #2 S/P adjuvant chemotherapy consisting of Taxotere, carboplatinum and Herceptin given every 21 days for total of 6 cycles of Taxotere carboplatinum and  weekly Herceptin beginning on 06/14/12.  Adjuvant every three week herceptin from 10/04/12 - 06/13/13, S/P radiation therapy from on 10/25/12 through 12/11/12.    Patient was begun on adjuvant letrozole 2.5 mg daily. She is tolerating it well.  She has no sign of recurrence and will continue Letrozole daily.    #3  We reviewed her health maintenance above.  I counseled her on survivorship and healthy diet, exercise, and monthly self breast exams.  I did refer her to Dr. Leland Johns today in plastic surgery.    Tisha will return in 6 months for labs and evaluation.    All questions were answered. The patient knows to call the clinic with any problems, questions or concerns. We can certainly see the patient much sooner if necessary.   I spent 25 minutes counseling the patient face to face. The total time spent in the appointment was 30 minutes.  Minette Headland, [place] [phone]   11/16/2013, 9:05 AM

## 2013-11-20 DIAGNOSIS — Z853 Personal history of malignant neoplasm of breast: Secondary | ICD-10-CM | POA: Diagnosis not present

## 2013-11-20 DIAGNOSIS — Z9010 Acquired absence of unspecified breast and nipple: Secondary | ICD-10-CM | POA: Diagnosis not present

## 2013-11-26 ENCOUNTER — Other Ambulatory Visit (INDEPENDENT_AMBULATORY_CARE_PROVIDER_SITE_OTHER): Payer: Self-pay

## 2013-11-26 DIAGNOSIS — C50919 Malignant neoplasm of unspecified site of unspecified female breast: Secondary | ICD-10-CM

## 2013-11-26 MED ORDER — UNABLE TO FIND: Status: DC

## 2013-12-06 ENCOUNTER — Encounter (INDEPENDENT_AMBULATORY_CARE_PROVIDER_SITE_OTHER): Payer: Self-pay | Admitting: General Surgery

## 2013-12-10 ENCOUNTER — Other Ambulatory Visit (INDEPENDENT_AMBULATORY_CARE_PROVIDER_SITE_OTHER): Payer: Self-pay

## 2013-12-10 DIAGNOSIS — C50919 Malignant neoplasm of unspecified site of unspecified female breast: Secondary | ICD-10-CM

## 2013-12-10 MED ORDER — UNABLE TO FIND: Status: DC

## 2013-12-16 ENCOUNTER — Ambulatory Visit (HOSPITAL_COMMUNITY)
Admission: RE | Admit: 2013-12-16 | Discharge: 2013-12-16 | Disposition: A | Discharge status: Home / Self Care | Payer: Medicare Other | Source: Ambulatory Visit | Attending: Internal Medicine | Admitting: Internal Medicine

## 2013-12-16 ENCOUNTER — Ambulatory Visit (INDEPENDENT_AMBULATORY_CARE_PROVIDER_SITE_OTHER): Payer: Medicare Other | Admitting: General Surgery

## 2013-12-16 ENCOUNTER — Ambulatory Visit (HOSPITAL_BASED_OUTPATIENT_CLINIC_OR_DEPARTMENT_OTHER)
Admission: RE | Admit: 2013-12-16 | Discharge: 2013-12-16 | Disposition: A | Discharge status: Home / Self Care | Payer: Medicare Other | Source: Ambulatory Visit | Attending: Internal Medicine | Admitting: Internal Medicine

## 2013-12-16 VITALS — BP 140/74 | HR 67 | Wt 135.5 lb

## 2013-12-16 DIAGNOSIS — C50419 Malignant neoplasm of upper-outer quadrant of unspecified female breast: Secondary | ICD-10-CM

## 2013-12-16 DIAGNOSIS — I10 Essential (primary) hypertension: Secondary | ICD-10-CM

## 2013-12-16 DIAGNOSIS — I059 Rheumatic mitral valve disease, unspecified: Secondary | ICD-10-CM

## 2013-12-16 DIAGNOSIS — I079 Rheumatic tricuspid valve disease, unspecified: Secondary | ICD-10-CM | POA: Diagnosis not present

## 2013-12-16 DIAGNOSIS — C50411 Malignant neoplasm of upper-outer quadrant of right female breast: Secondary | ICD-10-CM

## 2013-12-16 DIAGNOSIS — C50919 Malignant neoplasm of unspecified site of unspecified female breast: Secondary | ICD-10-CM | POA: Diagnosis present

## 2013-12-16 NOTE — Patient Instructions (Signed)
Follow up as needed

## 2013-12-16 NOTE — Progress Notes (Signed)
  Echocardiogram 2D Echocardiogram has been performed.  Donata Clay 12/16/2013, 11:04 AM

## 2013-12-16 NOTE — Progress Notes (Signed)
Patient ID: Heather [name], female   DOB: 12-06-1945, 68 y.o.   MRN: [id number]  Referring Oncologist: Dr Humphrey Rolls Oncologistt: Dr Humphrey Rolls PCP: Dr [name] General Surgeon: Dr Marlou Starks  HPI: [name] is a 68 year old female with invasive ductal carcinoma in R breast that is ER positive and HER-2/neu positive,  S/P R mastectomy and lymph node dissection 05/10/12.    She has been treated for HTN in the past but stopped taking the medications due to elevated renal function. Intolerant Ace-I due to cough.    She received a total of 6 cycles of Taxotere/carboplatin starting on 06/14/2012 and finishing in May. XRT finished in August 2014. Now receiving Herceptin every 3 weeks to end at the end of February 2015.    She returns for follow up. Herceptin is complete. BP is well-controlled now. Getting more active.  No dyspnea or orthopnea. Does get dizzy when she bends over quickly.   03/28/12 ECHO 60-65% Lateral S' 9.3 09/17/12 ECHO 55% lateral s' 9.4 03/05/13 ECHO EF 55% lateral s' 11.1 1/15 ECHO EF 60-65%, lateral s' 11.3, global longitudinal strain -22.9% 8/15 Echo EF 60% lateral s 11.2, GLS -25%  Labs (1/15): K 3.9, creatinine 1.0 Labs  (7/15) K 4.2 Cr 1.2  Past Medical History  Diagnosis Date  . Chronic kidney disease   . Hx: UTI (urinary tract infection)   . Right shoulder pain   . Right knee pain   . Hypertension     recently started Aldactone   . Hyperlipidemia     but not on meds;diet and exercise controlled  . Dizziness     has been going on for 56month;medical MD aware  . H/O hiatal hernia   . GERD (gastroesophageal reflux disease)     Tums prn  . Hemorrhoids   . Urinary frequency     d/t taking Aldactone  . History of UTI   . Anemia     hx of  . Cataract     immature-not sure of which eye  . Anxiety     r/t updated surgery  . Insomnia     takes Melatoniin daily  . Complication of anesthesia     pt states she is sensitive to meds  . Breast cancer     right  . Skin cancer    . Hx of radiation therapy 10/25/12- 12/11/12    right chest wall/regional lymph nodes 5040 cGy, 28 sessions, right chest wall boost 1000 cGy 1 session    Current Outpatient Prescriptions  Medication Sig Dispense Refill  . aspirin 81 MG tablet Take 81 mg by mouth daily.      . B Complex-C (SUPER B COMPLEX PO) Take 1 each by mouth daily.      . calcium carbonate (TUMS - DOSED IN MG ELEMENTAL CALCIUM) 500 MG chewable tablet Chew 1 tablet by mouth daily.      . Cholecalciferol (VITAMIN D-3 PO) Take 4,000 Units by mouth daily.      . furosemide (LASIX) 20 MG tablet Take 1 tablet (20 mg total) by mouth daily as needed.  30 tablet  3  . letrozole (FEMARA) 2.5 MG tablet Take 1 tablet (2.5 mg total) by mouth daily.  90 tablet  12  . magnesium gluconate (MAGONATE) 500 MG tablet Take 500 mg by mouth 2 (two) times daily.      . NON FORMULARY Post mastectomy bra [id number] non silicone breath prosthesis. S/p rt breast mastectomy      . omeprazole (  PRILOSEC) 40 MG capsule Take 1 capsule (40 mg total) by mouth daily.  30 capsule  6  . oxyCODONE-acetaminophen (ROXICET) 5-325 MG per tablet Take 1-2 tablets by mouth every 4 (four) hours as needed for severe pain (Given at discharge from [facility]).  30 tablet  0  . potassium chloride SA (K-DUR,KLOR-CON) 20 MEQ tablet Take 1 tablet (20 mEq total) by mouth daily as needed. When you take Furosemide  90 tablet  3  . Probiotic Product (PROBIOTIC DAILY PO) Take 1 each by mouth.      . spironolactone (ALDACTONE) 25 MG tablet Take 0.5 tablets (12.5 mg total) by mouth daily.  45 tablet  12  . UNABLE TO FIND Rx: L8000- Post Surgical Bra (Quantity: 6) O4599- Silicone Breast Prosthesis (Quantity: 1) Dx: 174.9; Right mastectomy  1 each  0   No current facility-administered medications for this encounter.     Allergies  Allergen Reactions  . Codeine     " loopy"  . Keflex [Cephalexin]     GI Upset  . Naprosyn [Naproxen] Other (See Comments)    GI ipset   . Tussin  [Guaifenesin] Other (See Comments)    Dizzy     History   Social History  . Marital Status: Married    Spouse Name: N/A    Number of Children: 2  . Years of Education: N/A   Occupational History  . Not on file.   Social History Main Topics  . Smoking status: Never Smoker   . Smokeless tobacco: Never Used  . Alcohol Use: No  . Drug Use: No  . Sexual Activity: Yes    Birth Control/ Protection: Post-menopausal   Other Topics Concern  . Not on file   Social History Narrative  . No narrative on file    Family History  Problem Relation Age of Onset  . Leukemia Mother   . Colon cancer Brother   . Cancer Brother     Prostate  . Colon cancer Maternal Grandmother     PHYSICAL EXAM: Filed Vitals:   12/16/13 1109  BP: 140/74  Pulse: 67   General:  Well appearing. No respiratory difficulty HEENT: normal Neck: supple. no JVD. Carotids 2+ bilat; no bruits. No lymphadenopathy or thryomegaly appreciated. Cor: PMI nondisplaced. Regular rate & rhythm. No rubs, gallops or murmurs. L port-a-cath Lungs: clear Abdomen: soft, nontender, nondistended. No hepatosplenomegaly. No bruits or masses. Good bowel sounds. Extremities: no cyanosis, clubbing, rash, no lower extremity edema. Right arm sleeve Neuro: alert & oriented x 3, cranial nerves grossly intact. moves all 4 extremities w/o difficulty. Affect pleasant.   ASSESSMENT & PLAN: 1. R breast CA: I reviewed the echo today.  EF and doppler parameters stable.  No CHF on exam. She has completed Herceptin. Can f/u PRN.    2. HTN: BP stable, continue current regimen.  K was normal recently. Cr slightly elevated. If Cr goes up or she is orthostatic - can switch to amlodipine.   Kawena Lyday,MD 11:48 AM

## 2013-12-16 NOTE — Addendum Note (Signed)
Encounter addended by: Scarlette Calico, RN on: 12/16/2013 11:57 AM<BR>     Documentation filed: Patient Instructions Section

## 2013-12-17 ENCOUNTER — Other Ambulatory Visit (INDEPENDENT_AMBULATORY_CARE_PROVIDER_SITE_OTHER): Payer: Self-pay

## 2013-12-17 MED ORDER — UNABLE TO FIND
Status: DC
Start: 2013-12-17 — End: 2015-01-11

## 2013-12-31 ENCOUNTER — Ambulatory Visit (INDEPENDENT_AMBULATORY_CARE_PROVIDER_SITE_OTHER): Payer: Medicare Other | Admitting: General Surgery

## 2013-12-31 VITALS — BP 122/76 | HR 75 | Temp 97.5°F | Ht 64.0 in | Wt 136.0 lb

## 2013-12-31 DIAGNOSIS — C50419 Malignant neoplasm of upper-outer quadrant of unspecified female breast: Secondary | ICD-10-CM | POA: Diagnosis not present

## 2013-12-31 DIAGNOSIS — C50411 Malignant neoplasm of upper-outer quadrant of right female breast: Secondary | ICD-10-CM

## 2013-12-31 NOTE — Progress Notes (Signed)
Subjective:     Patient ID: Heather [name], female   DOB: Dec 12, 1945, 68 y.o.   MRN: [id number]  HPI The patient is a 68 year old white female who is one year and 9 months status post right modified radical mastectomy for a T2 N1 right breast cancer. She was ER positive and HER-2 positive. She finished chemotherapy and is now taking letrozole and tolerating it well. She does have some minor aches in some of her joints.  Review of Systems  Constitutional: Negative.   HENT: Negative.   Eyes: Negative.   Respiratory: Negative.   Cardiovascular: Negative.   Gastrointestinal: Negative.   Endocrine: Negative.   Genitourinary: Negative.   Musculoskeletal: Positive for arthralgias.  Skin: Negative.   Allergic/Immunologic: Negative.   Neurological: Negative.   Hematological: Negative.   Psychiatric/Behavioral: Negative.        Objective:   Physical Exam  Constitutional: She is oriented to person, place, and time. She appears well-developed and well-nourished.  HENT:  Head: Normocephalic and atraumatic.  Eyes: Conjunctivae and EOM are normal. Pupils are equal, round, and reactive to light.  Neck: Normal range of motion. Neck supple.  Cardiovascular: Normal rate, regular rhythm and normal heart sounds.   Pulmonary/Chest: Effort normal and breath sounds normal.  There is no palpable mass of the right chest wall. There is no palpable mass of the left breast. There is no palpable axillary, supraclavicular, or cervical lymphadenopathy. She has very minimal lymphedema changes in the right arm  Abdominal: Soft. Bowel sounds are normal.  Musculoskeletal: Normal range of motion.  Lymphadenopathy:    She has no cervical adenopathy.  Neurological: She is alert and oriented to person, place, and time.  Skin: Skin is warm and dry.  Psychiatric: She has a normal mood and affect. Her behavior is normal.       Assessment:     The patient is one year 9 month status post right modified radical  mastectomy for breast cancer     Plan:     At this point she will continue to do regular self exams. She will continue to use the compression sleeve. She will continue to take letrozole. I will plan to see her back in about 6 months.

## 2013-12-31 NOTE — Patient Instructions (Signed)
Continue to use sleeve Continue regular self exams Continue letrazole

## 2014-01-22 DIAGNOSIS — E785 Hyperlipidemia, unspecified: Secondary | ICD-10-CM | POA: Diagnosis not present

## 2014-01-22 DIAGNOSIS — Z23 Encounter for immunization: Secondary | ICD-10-CM | POA: Diagnosis not present

## 2014-02-18 DIAGNOSIS — H04121 Dry eye syndrome of right lacrimal gland: Secondary | ICD-10-CM | POA: Diagnosis not present

## 2014-02-18 DIAGNOSIS — H04122 Dry eye syndrome of left lacrimal gland: Secondary | ICD-10-CM | POA: Diagnosis not present

## 2014-02-18 DIAGNOSIS — H25041 Posterior subcapsular polar age-related cataract, right eye: Secondary | ICD-10-CM | POA: Diagnosis not present

## 2014-02-18 DIAGNOSIS — H02839 Dermatochalasis of unspecified eye, unspecified eyelid: Secondary | ICD-10-CM | POA: Diagnosis not present

## 2014-03-06 DIAGNOSIS — N6002 Solitary cyst of left breast: Secondary | ICD-10-CM | POA: Diagnosis not present

## 2014-03-06 DIAGNOSIS — Z853 Personal history of malignant neoplasm of breast: Secondary | ICD-10-CM | POA: Diagnosis not present

## 2014-03-12 DIAGNOSIS — E785 Hyperlipidemia, unspecified: Secondary | ICD-10-CM | POA: Diagnosis not present

## 2014-03-20 DIAGNOSIS — Z08 Encounter for follow-up examination after completed treatment for malignant neoplasm: Secondary | ICD-10-CM | POA: Diagnosis not present

## 2014-03-20 DIAGNOSIS — L720 Epidermal cyst: Secondary | ICD-10-CM | POA: Diagnosis not present

## 2014-03-20 DIAGNOSIS — Z85828 Personal history of other malignant neoplasm of skin: Secondary | ICD-10-CM | POA: Diagnosis not present

## 2014-03-20 DIAGNOSIS — L723 Sebaceous cyst: Secondary | ICD-10-CM | POA: Diagnosis not present

## 2014-03-20 DIAGNOSIS — Z1283 Encounter for screening for malignant neoplasm of skin: Secondary | ICD-10-CM | POA: Diagnosis not present

## 2014-03-27 ENCOUNTER — Other Ambulatory Visit: Payer: Self-pay

## 2014-03-27 DIAGNOSIS — C50411 Malignant neoplasm of upper-outer quadrant of right female breast: Secondary | ICD-10-CM

## 2014-03-27 MED ORDER — LETROZOLE 2.5 MG PO TABS: 2.5000 mg | ORAL_TABLET | Freq: Every day | ORAL | Status: DC

## 2014-04-12 DIAGNOSIS — S61011A Laceration without foreign body of right thumb without damage to nail, initial encounter: Secondary | ICD-10-CM | POA: Diagnosis not present

## 2014-04-29 ENCOUNTER — Encounter: Payer: Self-pay | Admitting: Hematology and Oncology

## 2014-05-22 ENCOUNTER — Telehealth: Payer: Self-pay | Admitting: Hematology and Oncology

## 2014-05-22 ENCOUNTER — Ambulatory Visit (HOSPITAL_BASED_OUTPATIENT_CLINIC_OR_DEPARTMENT_OTHER): Payer: Medicare Other | Admitting: Hematology and Oncology

## 2014-05-22 VITALS — BP 128/64 | HR 84 | Temp 98.2°F | Resp 18 | Ht 64.0 in | Wt 141.4 lb

## 2014-05-22 DIAGNOSIS — C50411 Malignant neoplasm of upper-outer quadrant of right female breast: Secondary | ICD-10-CM | POA: Diagnosis not present

## 2014-05-22 DIAGNOSIS — G620 Drug-induced polyneuropathy: Secondary | ICD-10-CM | POA: Diagnosis not present

## 2014-05-22 DIAGNOSIS — Z170 Estrogen receptor positive status [ER+]: Secondary | ICD-10-CM | POA: Diagnosis not present

## 2014-05-22 DIAGNOSIS — M25552 Pain in left hip: Secondary | ICD-10-CM

## 2014-05-22 NOTE — Telephone Encounter (Signed)
[facility] APPT SCHEDULE FOR JAN 2017

## 2014-05-22 NOTE — Progress Notes (Signed)
Patient Care Team: Vidal Schwalbe, MD as PCP - General (Family Medicine)  DIAGNOSIS: Primary cancer of upper outer quadrant of right female breast   Staging form: Breast, AJCC 7th Edition     Clinical: Stage IIB (T2, N1, cM0) - Unsigned       Staging comments: Staged at breast conference 11.6.13      Pathologic: No stage assigned - Unsigned   SUMMARY OF ONCOLOGIC HISTORY:   Primary cancer of upper outer quadrant of right female breast   03/08/2012 Initial Diagnosis Right breast invasive ductal carcinoma ER positive PR negative HER-2 positive Ki-67 44%; another breast mass biopsied in the anterior part of the breast which was also positive for malignancy that was HER-2 negative   05/10/2012 Surgery Right mastectomy and axillary lymph node dissection: Multifocal disease 5 cm, 1.7 cm, 1.6 cm, ER positive PR negative HER-2 positive Ki-67 44%, 3/7 lymph nodes positive   06/14/2012 - 06/12/2013 Chemotherapy Adjuvant chemotherapy with [place] 6 followed by Herceptin maintenance   10/25/2012 - 12/11/2012 Radiation Therapy Adjuvant radiation therapy   02/07/2013 -  Anti-estrogen oral therapy Letrozole 2.5 mg daily    CHIEF COMPLIANT: Follow-up of breast cancer  INTERVAL HISTORY: Heather [name] is a 69 year old lady with above-mentioned history of right-sided breast cancer treated with mastectomy followed by adjuvant chemotherapy radiation and is now on letrozole 2.5 mg daily since October 2014. She appears to be tolerating it fairly well without any major problems or concerns. She denies any new lumps or nodules in the breasts. Her only complaint is left hip pain for the past 2 or 3 months. She has been applying ointments and hot and cold packs to help with the pain. She was instructed to take Aleve but she does not want take it.  REVIEW OF SYSTEMS:   Constitutional: Denies fevers, chills or abnormal weight loss Eyes: Denies blurriness of vision Ears, nose, mouth, throat, and face: Denies mucositis or sore  throat Respiratory: Denies cough, dyspnea or wheezes Cardiovascular: Denies palpitation, chest discomfort or lower extremity swelling Gastrointestinal:  Denies nausea, heartburn or change in bowel habits Skin: Denies abnormal skin rashes Lymphatics: Denies new lymphadenopathy or easy bruising Neurological:Denies numbness, tingling or new weaknesses Behavioral/Psych: Mood is stable, no new changes  Breast:  denies any pain or lumps or nodules in either breasts All other systems were reviewed with the patient and are negative.  I have reviewed the past medical history, past surgical history, social history and family history with the patient and they are unchanged from previous note.  ALLERGIES:  is allergic to codeine; keflex; naprosyn; and tussin.  MEDICATIONS:  Current Outpatient Prescriptions  Medication Sig Dispense Refill  . aspirin 81 MG tablet Take 81 mg by mouth daily.    . B Complex-C (SUPER B COMPLEX PO) Take 1 each by mouth daily.    . calcium carbonate (TUMS - DOSED IN MG ELEMENTAL CALCIUM) 500 MG chewable tablet Chew 1 tablet by mouth daily.    . Cholecalciferol (VITAMIN D-3 PO) Take 4,000 Units by mouth daily.    . furosemide (LASIX) 20 MG tablet Take 1 tablet (20 mg total) by mouth daily as needed. 30 tablet 3  . letrozole (FEMARA) 2.5 MG tablet Take 1 tablet (2.5 mg total) by mouth daily. 90 tablet 1  . magnesium gluconate (MAGONATE) 500 MG tablet Take 500 mg by mouth 2 (two) times daily.    . NON FORMULARY Post mastectomy bra [id number]- non silicone breath prosthesis. S/p rt breast mastectomy    .  pravastatin (PRAVACHOL) 10 MG tablet     . Probiotic Product (PROBIOTIC DAILY PO) Take 1 each by mouth.    . spironolactone (ALDACTONE) 25 MG tablet Take 0.5 tablets (12.5 mg total) by mouth daily. 45 tablet 12  . UNABLE TO FIND Rx: L8000- Post Surgical Bra (Quantity: 6) [id number]- Silicone Breast Prosthesis (Quantity: 1) Dx: 174.9; Right mastectomy 1 each 0  . UNABLE TO FIND 174.9  Malignant Neoplasm of Right Breast   W8032- Mastectomy Form Replacement-1 1 each 0   No current facility-administered medications for this visit.    PHYSICAL EXAMINATION: ECOG PERFORMANCE STATUS: 1 - Symptomatic but completely ambulatory  Filed Vitals:   05/22/14 1409  BP: 128/64  Pulse: 84  Temp: 98.2 F (36.8 C)  Resp: 18   Filed Weights   05/22/14 1409  Weight: 141 lb 6.4 oz (64.139 kg)    GENERAL:alert, no distress and comfortable SKIN: skin color, texture, turgor are normal, no rashes or significant lesions EYES: normal, Conjunctiva are pink and non-injected, sclera clear OROPHARYNX:no exudate, no erythema and lips, buccal mucosa, and tongue normal  NECK: supple, thyroid normal size, non-tender, without nodularity LYMPH:  no palpable lymphadenopathy in the cervical, axillary or inguinal LUNGS: clear to auscultation and percussion with normal breathing effort HEART: regular rate & rhythm and no murmurs and no lower extremity edema ABDOMEN:abdomen soft, non-tender and normal bowel sounds Musculoskeletal:no cyanosis of digits and no clubbing  NEURO: alert & oriented x 3 with fluent speech, no focal motor/sensory deficits BREAST: No palpable masses or nodules in either right or left breasts. No palpable axillary supraclavicular or infraclavicular adenopathy no breast tenderness or nipple discharge.   LABORATORY DATA:  I have reviewed the data as listed   Chemistry      Component Value Date/Time   NA 138 11/14/2013 1316   NA 138 05/11/2012 0640   K 4.2 11/14/2013 1316   K 4.7 05/11/2012 0640   CL 101 10/25/2012 0946   CL 99 05/11/2012 0640   CO2 29 11/14/2013 1316   CO2 20 05/11/2012 0640   BUN 12.9 11/14/2013 1316   BUN 12 05/11/2012 0640   CREATININE 1.2* 11/14/2013 1316   CREATININE 0.88 05/11/2012 0640      Component Value Date/Time   CALCIUM 10.1 11/14/2013 1316   CALCIUM 9.7 05/11/2012 0640   ALKPHOS 75 11/14/2013 1316   AST 22 11/14/2013 1316   ALT  18 11/14/2013 1316   BILITOT 0.54 11/14/2013 1316       Lab Results  Component Value Date   WBC 5.1 11/14/2013   HGB 13.0 11/14/2013   HCT 39.2 11/14/2013   MCV 87.8 11/14/2013   PLT 184 11/14/2013   NEUTROABS 3.7 11/14/2013     RADIOGRAPHIC STUDIES: I have personally reviewed the radiology reports and agreed with their findings. Mammogram 03/06/2014 is normal  ASSESSMENT & PLAN:  Primary cancer of upper outer quadrant of right female breast Right breast invasive ductal carcinoma ER positive PR negative HER-2 positive Ki-67 44% multifocal disease 3/7 lymph nodes positive T2 N1 M0 stage IIB status post adjuvant chemotherapy with TCH followed by Herceptin maintenance, adjuvant radiation therapy, currently on letrozole since 02/07/2013.  Letrozole toxicities: 1. Joint aches and pains 2. Hot flashes mild 3. Vaginal dryness 4. Left hip pain: I believe it is musculoskeletal in nature. She is using hot and warm compresses and liniments with some relief. I discussed with her that if this does not get better over the next 2-3 months to  call us back. We might have to do a bone scan to evaluate this further.  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 Mammogram 03/06/2014: Abnormal calcifications and cyst evaluated by ultrasound, this was removed by dermatology  Return to clinic in 1 year for follow-up      Orders Placed This Encounter  Procedures  . CBC with Differential    Standing Status: Future     Number of Occurrences:      Standing Expiration Date: 05/22/2015  . Comprehensive metabolic panel (Cmet) - [facility]    Standing Status: Future     Number of Occurrences:      Standing Expiration Date: 05/22/2015   The patient has a good understanding of the overall plan. she agrees with it. She will call with any problems that may develop before her next visit here.   Rulon Eisenmenger, MD

## 2014-05-22 NOTE — Assessment & Plan Note (Addendum)
Right breast invasive ductal carcinoma ER positive PR negative HER-2 positive Ki-67 44% multifocal disease 3/7 lymph nodes positive T2 N1 M0 stage IIB status post adjuvant chemotherapy with TCH followed by Herceptin maintenance, adjuvant radiation therapy, currently on letrozole since 02/07/2013.  Letrozole toxicities: 1. Joint aches and pains 2. Hot flashes mild 3. Vaginal dryness 4. Left hip pain: I believe it is musculoskeletal in nature. She is using hot and warm compresses and liniments with some relief. I discussed with her that if this does not get better over the next 2-3 months to call us back. We might have to do a bone scan to evaluate this further.  Neuropathy related to prior [facility] chemotherapy is much improved  Breast cancer surveillance: 1. Mammogram 03/06/2014: Abnormal calcifications and cyst evaluated by ultrasound, this was removed by dermatology  Return to clinic in 1 year for follow-up

## 2014-06-19 ENCOUNTER — Other Ambulatory Visit: Payer: Self-pay | Admitting: Pharmacist

## 2014-07-31 DIAGNOSIS — C50911 Malignant neoplasm of unspecified site of right female breast: Secondary | ICD-10-CM | POA: Diagnosis not present

## 2014-08-21 DIAGNOSIS — L570 Actinic keratosis: Secondary | ICD-10-CM | POA: Diagnosis not present

## 2014-08-21 DIAGNOSIS — D225 Melanocytic nevi of trunk: Secondary | ICD-10-CM | POA: Diagnosis not present

## 2014-08-21 DIAGNOSIS — L905 Scar conditions and fibrosis of skin: Secondary | ICD-10-CM | POA: Diagnosis not present

## 2014-08-21 DIAGNOSIS — L820 Inflamed seborrheic keratosis: Secondary | ICD-10-CM | POA: Diagnosis not present

## 2014-08-21 DIAGNOSIS — X32XXXA Exposure to sunlight, initial encounter: Secondary | ICD-10-CM | POA: Diagnosis not present

## 2014-10-08 DIAGNOSIS — R05 Cough: Secondary | ICD-10-CM | POA: Diagnosis not present

## 2014-10-21 ENCOUNTER — Other Ambulatory Visit: Payer: Self-pay | Admitting: *Deleted

## 2014-10-21 ENCOUNTER — Other Ambulatory Visit: Payer: Self-pay | Admitting: Family Medicine

## 2014-10-21 ENCOUNTER — Other Ambulatory Visit (HOSPITAL_COMMUNITY)
Admission: RE | Admit: 2014-10-21 | Discharge: 2014-10-21 | Disposition: A | Discharge status: Home / Self Care | Payer: Medicare Other | Source: Ambulatory Visit | Attending: Family Medicine | Admitting: Family Medicine

## 2014-10-21 DIAGNOSIS — I129 Hypertensive chronic kidney disease with stage 1 through stage 4 chronic kidney disease, or unspecified chronic kidney disease: Secondary | ICD-10-CM | POA: Diagnosis not present

## 2014-10-21 DIAGNOSIS — Z124 Encounter for screening for malignant neoplasm of cervix: Secondary | ICD-10-CM | POA: Insufficient documentation

## 2014-10-21 DIAGNOSIS — E559 Vitamin D deficiency, unspecified: Secondary | ICD-10-CM | POA: Diagnosis not present

## 2014-10-21 DIAGNOSIS — Z1389 Encounter for screening for other disorder: Secondary | ICD-10-CM | POA: Diagnosis not present

## 2014-10-21 DIAGNOSIS — Z0000 Encounter for general adult medical examination without abnormal findings: Secondary | ICD-10-CM | POA: Diagnosis not present

## 2014-10-21 DIAGNOSIS — N898 Other specified noninflammatory disorders of vagina: Secondary | ICD-10-CM

## 2014-10-21 DIAGNOSIS — Z79899 Other long term (current) drug therapy: Secondary | ICD-10-CM | POA: Diagnosis not present

## 2014-10-21 DIAGNOSIS — C50911 Malignant neoplasm of unspecified site of right female breast: Secondary | ICD-10-CM | POA: Diagnosis not present

## 2014-10-21 DIAGNOSIS — N183 Chronic kidney disease, stage 3 (moderate): Secondary | ICD-10-CM | POA: Diagnosis not present

## 2014-10-21 DIAGNOSIS — C50411 Malignant neoplasm of upper-outer quadrant of right female breast: Secondary | ICD-10-CM

## 2014-10-21 DIAGNOSIS — E785 Hyperlipidemia, unspecified: Secondary | ICD-10-CM | POA: Diagnosis not present

## 2014-10-21 DIAGNOSIS — K219 Gastro-esophageal reflux disease without esophagitis: Secondary | ICD-10-CM | POA: Diagnosis not present

## 2014-10-21 MED ORDER — LETROZOLE 2.5 MG PO TABS: 2.5000 mg | ORAL_TABLET | Freq: Every day | ORAL | Status: DC

## 2014-10-22 ENCOUNTER — Telehealth: Payer: Self-pay | Admitting: *Deleted

## 2014-10-22 LAB — CYTOLOGY - PAP

## 2014-10-22 NOTE — Telephone Encounter (Signed)
3 way call received from [facility] at [facility] mail order pharmacy, patient and this RN.  Pt asking if fax has been sent in for refill of Letrozole that she called about on 10/21/14.  Fax was sent in at [phone] on 10/21/14 by D. Burleson Therapist, sports.

## 2014-10-27 ENCOUNTER — Ambulatory Visit
Admission: RE | Admit: 2014-10-27 | Discharge: 2014-10-27 | Disposition: A | Discharge status: Home / Self Care | Payer: Medicare Other | Source: Ambulatory Visit | Attending: Family Medicine | Admitting: Family Medicine

## 2014-10-27 ENCOUNTER — Other Ambulatory Visit: Payer: Self-pay | Admitting: *Deleted

## 2014-10-27 ENCOUNTER — Other Ambulatory Visit: Payer: Self-pay | Admitting: Hematology and Oncology

## 2014-10-27 ENCOUNTER — Ambulatory Visit
Admission: RE | Admit: 2014-10-27 | Discharge: 2014-10-27 | Disposition: A | Discharge status: Home / Self Care | Payer: BLUE CROSS/BLUE SHIELD | Source: Ambulatory Visit | Attending: Family Medicine | Admitting: Family Medicine

## 2014-10-27 DIAGNOSIS — N898 Other specified noninflammatory disorders of vagina: Secondary | ICD-10-CM

## 2014-10-27 DIAGNOSIS — C50411 Malignant neoplasm of upper-outer quadrant of right female breast: Secondary | ICD-10-CM

## 2014-10-27 MED ORDER — LETROZOLE 2.5 MG PO TABS: 2.5000 mg | ORAL_TABLET | Freq: Every day | ORAL | Status: DC

## 2014-12-04 ENCOUNTER — Other Ambulatory Visit: Payer: Self-pay | Admitting: Family Medicine

## 2014-12-04 DIAGNOSIS — K219 Gastro-esophageal reflux disease without esophagitis: Secondary | ICD-10-CM | POA: Diagnosis not present

## 2014-12-04 DIAGNOSIS — R1011 Right upper quadrant pain: Secondary | ICD-10-CM

## 2014-12-04 DIAGNOSIS — R079 Chest pain, unspecified: Secondary | ICD-10-CM | POA: Diagnosis not present

## 2014-12-05 ENCOUNTER — Ambulatory Visit
Admission: RE | Admit: 2014-12-05 | Discharge: 2014-12-05 | Disposition: A | Discharge status: Home / Self Care | Payer: Medicare Other | Source: Ambulatory Visit | Attending: Family Medicine | Admitting: Family Medicine

## 2014-12-05 DIAGNOSIS — R1011 Right upper quadrant pain: Secondary | ICD-10-CM

## 2015-01-01 DIAGNOSIS — L255 Unspecified contact dermatitis due to plants, except food: Secondary | ICD-10-CM | POA: Diagnosis not present

## 2015-01-06 DIAGNOSIS — R21 Rash and other nonspecific skin eruption: Secondary | ICD-10-CM | POA: Diagnosis not present

## 2015-01-06 DIAGNOSIS — K219 Gastro-esophageal reflux disease without esophagitis: Secondary | ICD-10-CM | POA: Diagnosis not present

## 2015-01-11 ENCOUNTER — Encounter (HOSPITAL_COMMUNITY): Payer: Self-pay

## 2015-01-11 ENCOUNTER — Emergency Department (HOSPITAL_COMMUNITY)
Admission: EM | Admit: 2015-01-11 | Discharge: 2015-01-11 | Disposition: A | Discharge status: Home / Self Care | Payer: Medicare Other | Attending: Emergency Medicine | Admitting: Emergency Medicine

## 2015-01-11 ENCOUNTER — Emergency Department (HOSPITAL_COMMUNITY): Payer: Medicare Other

## 2015-01-11 DIAGNOSIS — Z7982 Long term (current) use of aspirin: Secondary | ICD-10-CM | POA: Diagnosis not present

## 2015-01-11 DIAGNOSIS — Z87440 Personal history of urinary (tract) infections: Secondary | ICD-10-CM | POA: Diagnosis not present

## 2015-01-11 DIAGNOSIS — Z862 Personal history of diseases of the blood and blood-forming organs and certain disorders involving the immune mechanism: Secondary | ICD-10-CM | POA: Diagnosis not present

## 2015-01-11 DIAGNOSIS — I129 Hypertensive chronic kidney disease with stage 1 through stage 4 chronic kidney disease, or unspecified chronic kidney disease: Secondary | ICD-10-CM | POA: Diagnosis not present

## 2015-01-11 DIAGNOSIS — N189 Chronic kidney disease, unspecified: Secondary | ICD-10-CM | POA: Diagnosis not present

## 2015-01-11 DIAGNOSIS — Z79899 Other long term (current) drug therapy: Secondary | ICD-10-CM | POA: Insufficient documentation

## 2015-01-11 DIAGNOSIS — G4700 Insomnia, unspecified: Secondary | ICD-10-CM | POA: Insufficient documentation

## 2015-01-11 DIAGNOSIS — R1013 Epigastric pain: Secondary | ICD-10-CM | POA: Diagnosis not present

## 2015-01-11 DIAGNOSIS — A048 Other specified bacterial intestinal infections: Secondary | ICD-10-CM | POA: Insufficient documentation

## 2015-01-11 DIAGNOSIS — Z8619 Personal history of other infectious and parasitic diseases: Secondary | ICD-10-CM | POA: Diagnosis not present

## 2015-01-11 DIAGNOSIS — E785 Hyperlipidemia, unspecified: Secondary | ICD-10-CM | POA: Insufficient documentation

## 2015-01-11 DIAGNOSIS — Z85828 Personal history of other malignant neoplasm of skin: Secondary | ICD-10-CM | POA: Diagnosis not present

## 2015-01-11 DIAGNOSIS — Z853 Personal history of malignant neoplasm of breast: Secondary | ICD-10-CM | POA: Diagnosis not present

## 2015-01-11 DIAGNOSIS — K219 Gastro-esophageal reflux disease without esophagitis: Secondary | ICD-10-CM | POA: Insufficient documentation

## 2015-01-11 DIAGNOSIS — F419 Anxiety disorder, unspecified: Secondary | ICD-10-CM | POA: Insufficient documentation

## 2015-01-11 DIAGNOSIS — R079 Chest pain, unspecified: Secondary | ICD-10-CM | POA: Diagnosis not present

## 2015-01-11 DIAGNOSIS — R110 Nausea: Secondary | ICD-10-CM | POA: Diagnosis not present

## 2015-01-11 HISTORY — DX: Other specified bacterial intestinal infections: A04.8

## 2015-01-11 LAB — CBC
HCT: 41.1 % (ref 36.0–46.0)
Hemoglobin: 13.6 g/dL (ref 12.0–15.0)
MCH: 28.6 pg (ref 26.0–34.0)
MCHC: 33.1 g/dL (ref 30.0–36.0)
MCV: 86.5 fL (ref 78.0–100.0)
Platelets: 189 10*3/uL (ref 150–400)
RBC: 4.75 MIL/uL (ref 3.87–5.11)
RDW: 13 % (ref 11.5–15.5)
WBC: 4.7 10*3/uL (ref 4.0–10.5)

## 2015-01-11 LAB — HEPATIC FUNCTION PANEL
ALT: 23 U/L (ref 14–54)
AST: 29 U/L (ref 15–41)
Albumin: 4.8 g/dL (ref 3.5–5.0)
Alkaline Phosphatase: 96 U/L (ref 38–126)
Bilirubin, Direct: 0.1 mg/dL (ref 0.1–0.5)
Indirect Bilirubin: 1 mg/dL — ABNORMAL HIGH (ref 0.3–0.9)
Total Bilirubin: 1.1 mg/dL (ref 0.3–1.2)
Total Protein: 8.2 g/dL — ABNORMAL HIGH (ref 6.5–8.1)

## 2015-01-11 LAB — BASIC METABOLIC PANEL
Anion gap: 9 (ref 5–15)
BUN: 17 mg/dL (ref 6–20)
CO2: 30 mmol/L (ref 22–32)
Calcium: 10.2 mg/dL (ref 8.9–10.3)
Chloride: 98 mmol/L — ABNORMAL LOW (ref 101–111)
Creatinine, Ser: 1.06 mg/dL — ABNORMAL HIGH (ref 0.44–1.00)
GFR calc Af Amer: >60 mL/min (ref >60)
GFR calc non Af Amer: 52 mL/min — ABNORMAL LOW (ref >60)
Glucose, Bld: 120 mg/dL — ABNORMAL HIGH (ref 65–99)
Potassium: 4.1 mmol/L (ref 3.5–5.1)
Sodium: 137 mmol/L (ref 135–145)

## 2015-01-11 LAB — LIPASE, BLOOD: Lipase: 13 U/L — ABNORMAL LOW (ref 22–51)

## 2015-01-11 LAB — URINALYSIS, ROUTINE W REFLEX MICROSCOPIC
Bilirubin Urine: NEGATIVE
Glucose, UA: NEGATIVE mg/dL
Hgb urine dipstick: NEGATIVE
Ketones, ur: NEGATIVE mg/dL
Leukocytes, UA: NEGATIVE
Nitrite: NEGATIVE
Protein, ur: NEGATIVE mg/dL
Specific Gravity, Urine: 1.005 (ref 1.005–1.030)
Urobilinogen, UA: 0.2 mg/dL (ref 0.0–1.0)
pH: 7.5 (ref 5.0–8.0)

## 2015-01-11 LAB — I-STAT TROPONIN, ED: Troponin i, poc: 0 ng/mL (ref 0.00–0.08)

## 2015-01-11 LAB — D-DIMER, QUANTITATIVE (NOT AT ARMC): D-Dimer, Quant: <0.27 ug/mL-FEU (ref 0.00–0.48)

## 2015-01-11 MED ORDER — GI COCKTAIL ~~LOC~~
30.0000 mL | Freq: Once | ORAL | Status: AC
  Administered 2015-01-11: 30 mL via ORAL
  Filled 2015-01-11: qty 30

## 2015-01-11 NOTE — Discharge Instructions (Signed)

## 2015-01-11 NOTE — ED Provider Notes (Signed)
CSN: [id number]     Arrival date & time 01/11/15  0818 History   First MD Initiated Contact with Patient 01/11/15 0827     Chief Complaint  Patient presents with  . Chest Pain     (Consider location/radiation/quality/duration/timing/severity/associated sxs/prior Treatment) Patient is a 69 y.o. female presenting with chest pain.  Chest Pain Pain location:  Epigastric Pain quality: burning   Pain radiates to:  Upper back Pain radiates to the back: yes   Pain severity:  Moderate Onset quality:  Gradual Duration:  2 months Timing:  Intermittent Progression:  Unchanged Chronicity:  Chronic Context comment:  Currently on triple therapy, dx with PUD Relieved by:  Nothing Exacerbated by: laying flat. Associated symptoms: abdominal pain and nausea   Associated symptoms: no cough, no fever and not vomiting     Past Medical History  Diagnosis Date  . Chronic kidney disease   . Hx: UTI (urinary tract infection)   . Right shoulder pain   . Right knee pain   . Hypertension     recently started Aldactone   . Hyperlipidemia     but not on meds;diet and exercise controlled  . Dizziness     has been going on for 68month;medical MD aware  . H/O hiatal hernia   . GERD (gastroesophageal reflux disease)     Tums prn  . Hemorrhoids   . Urinary frequency     d/t taking Aldactone  . History of UTI   . Anemia     hx of  . Cataract     immature-not sure of which eye  . Anxiety     r/t updated surgery  . Insomnia     takes Melatoniin daily  . Complication of anesthesia     pt states she is sensitive to meds  . Breast cancer     right  . Skin cancer   . Hx of radiation therapy 10/25/12- 12/11/12    right chest wall/regional lymph nodes 5040 cGy, 28 sessions, right chest wall boost 1000 cGy 1 session  . H. pylori infection    Past Surgical History  Procedure Laterality Date  . Cyst removed from left breast  1970  . Left wrist surgery   2004    with plate  . Colonoscopy    .  Esophagogastroduodenoscopy    . Mastectomy, radical    . Mastectomy modified radical  05/10/2012    Procedure: MASTECTOMY MODIFIED RADICAL;  Surgeon: PMerrie Roof MD;  Location: [facility]  Service: General;  Laterality: Right;  RIGHT MODIFIED RADICAL MASTECTOMY  . Portacath placement  05/10/2012    Procedure: INSERTION PORT-A-CATH;  Surgeon: PMerrie Roof MD;  [facility];  Service: General;  Laterality: Left;   Family History  Problem Relation Age of Onset  . Leukemia Mother   . Colon cancer Brother   . Cancer Brother     Prostate  . Colon cancer Maternal Grandmother    Social History  Substance Use Topics  . Smoking status: Never Smoker   . Smokeless tobacco: Never Used  . Alcohol Use: No   OB History    No data available     Review of Systems  Constitutional: Negative for fever.  Respiratory: Negative for cough.   Cardiovascular: Positive for chest pain.  Gastrointestinal: Positive for nausea and abdominal pain. Negative for vomiting.  All other systems reviewed and are negative.     Allergies  Codeine; Keflex; Naprosyn; and Tussin  Home  Medications   Prior to Admission medications   Medication Sig Start Date End Date Taking? Authorizing Provider  amoxicillin (AMOXIL) 500 MG capsule Take 500 mg by mouth 2 (two) times daily. For two weeks start 8/31 01/07/15  Yes Historical Provider, MD  aspirin 81 MG tablet Take 81 mg by mouth daily.   Yes Historical Provider, MD  B Complex-C (SUPER B COMPLEX PO) Take 1 each by mouth daily.   Yes Historical Provider, MD  calcium carbonate (TUMS - DOSED IN MG ELEMENTAL CALCIUM) 500 MG chewable tablet Chew 1 tablet by mouth daily.   Yes Historical Provider, MD  Cholecalciferol (VITAMIN D-3 PO) Take 4,000 Units by mouth daily.   Yes Historical Provider, MD  clarithromycin (BIAXIN) 500 MG tablet Take 500 mg by mouth 2 (two) times daily. For 14 days start 8/31 01/07/15  Yes Historical Provider, MD  letrozole (FEMARA) 2.5 MG tablet Take  1 tablet (2.5 mg total) by mouth daily. 10/27/14  Yes [name], MD  magnesium gluconate (MAGONATE) 500 MG tablet Take 500 mg by mouth 2 (two) times daily.   Yes Historical Provider, MD  pantoprazole (PROTONIX) 40 MG tablet Take 40 mg by mouth 2 (two) times daily.  01/07/15  Yes Historical Provider, MD  pravastatin (PRAVACHOL) 10 MG tablet Take 10 mg by mouth daily.  03/14/14  Yes Historical Provider, MD  Probiotic Product (PROBIOTIC DAILY PO) Take 1 each by mouth daily.    Yes Historical Provider, MD  spironolactone (ALDACTONE) 25 MG tablet Take 0.5 tablets (12.5 mg total) by mouth daily. 04/15/13  Yes Minette Headland, NP  UNABLE TO FIND Rx: [id number]- Post Surgical Bra (Quantity: 6) M0102- Silicone Breast Prosthesis (Quantity: 1) Dx: 174.9; Right mastectomy 12/10/13  Yes Autumn Messing III, MD  furosemide (LASIX) 20 MG tablet Take 1 tablet (20 mg total) by mouth daily as needed. Patient not taking: Reported on 01/11/2015 11/16/12   Shaune Pascal Bensimhon, MD   BP 130/66 mmHg  Pulse 76  Temp(Src) 98.2 F (36.8 C) (Oral)  Resp 13  Ht '5\' 4"'$  (1.626 m)  Wt 136 lb (61.689 kg)  BMI 23.33 kg/m2  SpO2 97% Physical Exam  Constitutional: She is oriented to person, place, and time. She appears well-developed and well-nourished.  HENT:  Head: Normocephalic and atraumatic.  Right Ear: External ear normal.  Left Ear: External ear normal.  Eyes: Conjunctivae and EOM are normal. Pupils are equal, round, and reactive to light.  Neck: Normal range of motion. Neck supple.  Cardiovascular: Normal rate, regular rhythm, normal heart sounds and intact distal pulses.   Pulmonary/Chest: Effort normal and breath sounds normal.  Abdominal: Soft. Bowel sounds are normal. There is no tenderness.  Musculoskeletal: Normal range of motion.  Neurological: She is alert and oriented to person, place, and time.  Skin: Skin is warm and dry.  Vitals reviewed.   ED Course  Procedures (including critical care time) Labs  Review Labs Reviewed  BASIC METABOLIC PANEL - Abnormal; Notable for the following:    Chloride 98 (*)    Glucose, Bld 120 (*)    Creatinine, Ser 1.06 (*)    GFR calc non Af Amer 52 (*)    All other components within normal limits  LIPASE, BLOOD - Abnormal; Notable for the following:    Lipase 13 (*)    All other components within normal limits  HEPATIC FUNCTION PANEL - Abnormal; Notable for the following:    Total Protein 8.2 (*)    Indirect Bilirubin  1.0 (*)    All other components within normal limits  CBC  URINALYSIS, ROUTINE W REFLEX MICROSCOPIC (NOT AT [facility])  D-DIMER, QUANTITATIVE (NOT AT [facility])  Randolm Idol, ED    Imaging Review Dg Chest 2 View  01/11/2015   CLINICAL DATA:  Acid reflux and chest pain for 2 weeks. History of hypertension.  EXAM: CHEST  2 VIEW  COMPARISON:  05/10/2012  FINDINGS: Lungs are mildly hyperinflated. Heart size is normal. There are no focal consolidations or pleural effusions. Surgical clips overlie the right axillary region.  IMPRESSION: No active cardiopulmonary disease.   Electronically Signed   By: Nolon Nations M.D.   On: 01/11/2015 09:18   I have personally reviewed and evaluated these images and lab results as part of my medical decision-making.   EKG Interpretation   Date/Time:  Sunday January 11 2015 08:31:00 EDT Ventricular Rate:  87 PR Interval:  154 QRS Duration: 76 QT Interval:  372 QTC Calculation: 447 R Axis:   60 Text Interpretation:  Sinus rhythm LAE, consider biatrial enlargement No  significant change since last tracing Confirmed by Debby Freiberg [phone])  on 01/11/2015 9:18:07 AM      MDM   Final diagnoses:  Epigastric abdominal pain    69 y.o. female with pertinent PMH of chronic epigastric pain and GERD with ongoing wu, planned GI visit in 3 weeks presents with chronic continued abd pain. Continued epigastric and chest pain.   patient does not endorse any new symptoms to precipitate visit today, states that  she is frustrated that her GI follow-up is at the end of this month. She does have chest pain, however states that her pain is better with deep inspiration. On arrival today vitals signs and physical exam as above. No abdominal tenderness on my exam. Workup unremarkable. Patient given GI cocktail. Feel the patient is stable to discharge home with GI follow-up. Attempted to address all of patient's questions and allay all concerns. DC home in stable condition. I have reviewed all laboratory and imaging studies if ordered as above  1. Epigastric abdominal pain         Debby Freiberg, MD 01/11/15 1053

## 2015-01-11 NOTE — ED Notes (Signed)
Pt given d/c instructions, verbalized understanding.

## 2015-01-11 NOTE — ED Notes (Signed)
She c/o epigastric discomfort for past several days for which she has seen her pcp, Dr. Deland Pretty.  She states this pain is made worse by bending over. She states that today, this discomfort is radiating more into her chest and into upper back.  Her skin is normal, warm and dry and she is breathing normally.

## 2015-01-14 DIAGNOSIS — K219 Gastro-esophageal reflux disease without esophagitis: Secondary | ICD-10-CM | POA: Diagnosis not present

## 2015-01-14 DIAGNOSIS — R079 Chest pain, unspecified: Secondary | ICD-10-CM | POA: Diagnosis not present

## 2015-01-14 DIAGNOSIS — C50911 Malignant neoplasm of unspecified site of right female breast: Secondary | ICD-10-CM | POA: Diagnosis not present

## 2015-01-14 DIAGNOSIS — A048 Other specified bacterial intestinal infections: Secondary | ICD-10-CM | POA: Diagnosis not present

## 2015-01-21 ENCOUNTER — Other Ambulatory Visit: Payer: Self-pay | Admitting: Gastroenterology

## 2015-01-21 DIAGNOSIS — K2950 Unspecified chronic gastritis without bleeding: Secondary | ICD-10-CM | POA: Diagnosis not present

## 2015-01-21 DIAGNOSIS — K449 Diaphragmatic hernia without obstruction or gangrene: Secondary | ICD-10-CM | POA: Diagnosis not present

## 2015-01-21 DIAGNOSIS — R0789 Other chest pain: Secondary | ICD-10-CM | POA: Diagnosis not present

## 2015-01-21 DIAGNOSIS — K2970 Gastritis, unspecified, without bleeding: Secondary | ICD-10-CM | POA: Diagnosis not present

## 2015-01-21 DIAGNOSIS — K219 Gastro-esophageal reflux disease without esophagitis: Secondary | ICD-10-CM | POA: Diagnosis not present

## 2015-02-02 DIAGNOSIS — M5489 Other dorsalgia: Secondary | ICD-10-CM | POA: Diagnosis not present

## 2015-02-03 ENCOUNTER — Other Ambulatory Visit: Payer: Self-pay | Admitting: Family Medicine

## 2015-02-03 DIAGNOSIS — R1013 Epigastric pain: Secondary | ICD-10-CM

## 2015-02-06 ENCOUNTER — Ambulatory Visit
Admission: RE | Admit: 2015-02-06 | Discharge: 2015-02-06 | Disposition: A | Discharge status: Home / Self Care | Payer: Medicare Other | Source: Ambulatory Visit | Attending: Family Medicine | Admitting: Family Medicine

## 2015-02-06 DIAGNOSIS — R1013 Epigastric pain: Secondary | ICD-10-CM

## 2015-02-06 MED ORDER — IOPAMIDOL (ISOVUE-300) INJECTION 61%
100.0000 mL | Freq: Once | INTRAVENOUS | Status: AC | PRN
  Administered 2015-02-06: 100 mL via INTRAVENOUS

## 2015-02-14 ENCOUNTER — Encounter (HOSPITAL_COMMUNITY): Payer: Self-pay

## 2015-02-14 ENCOUNTER — Inpatient Hospital Stay (HOSPITAL_COMMUNITY)
Admission: EM | Admit: 2015-02-14 | Discharge: 2015-02-18 | DRG: 029 | Disposition: A | Discharge status: Rehabilitation Facility | Payer: Medicare Other | Attending: Internal Medicine | Admitting: Internal Medicine

## 2015-02-14 ENCOUNTER — Emergency Department (HOSPITAL_COMMUNITY): Payer: Medicare Other

## 2015-02-14 DIAGNOSIS — E871 Hypo-osmolality and hyponatremia: Secondary | ICD-10-CM | POA: Diagnosis present

## 2015-02-14 DIAGNOSIS — G9619 Other disorders of meninges, not elsewhere classified: Secondary | ICD-10-CM | POA: Diagnosis not present

## 2015-02-14 DIAGNOSIS — N189 Chronic kidney disease, unspecified: Secondary | ICD-10-CM | POA: Diagnosis present

## 2015-02-14 DIAGNOSIS — Z170 Estrogen receptor positive status [ER+]: Secondary | ICD-10-CM | POA: Diagnosis not present

## 2015-02-14 DIAGNOSIS — R531 Weakness: Secondary | ICD-10-CM | POA: Diagnosis not present

## 2015-02-14 DIAGNOSIS — C7949 Secondary malignant neoplasm of other parts of nervous system: Secondary | ICD-10-CM | POA: Diagnosis present

## 2015-02-14 DIAGNOSIS — G4700 Insomnia, unspecified: Secondary | ICD-10-CM | POA: Diagnosis present

## 2015-02-14 DIAGNOSIS — Z79811 Long term (current) use of aromatase inhibitors: Secondary | ICD-10-CM | POA: Diagnosis not present

## 2015-02-14 DIAGNOSIS — K219 Gastro-esophageal reflux disease without esophagitis: Secondary | ICD-10-CM | POA: Diagnosis present

## 2015-02-14 DIAGNOSIS — Z923 Personal history of irradiation: Secondary | ICD-10-CM | POA: Diagnosis not present

## 2015-02-14 DIAGNOSIS — E785 Hyperlipidemia, unspecified: Secondary | ICD-10-CM | POA: Diagnosis present

## 2015-02-14 DIAGNOSIS — Z85828 Personal history of other malignant neoplasm of skin: Secondary | ICD-10-CM

## 2015-02-14 DIAGNOSIS — C50911 Malignant neoplasm of unspecified site of right female breast: Secondary | ICD-10-CM | POA: Diagnosis not present

## 2015-02-14 DIAGNOSIS — I129 Hypertensive chronic kidney disease with stage 1 through stage 4 chronic kidney disease, or unspecified chronic kidney disease: Secondary | ICD-10-CM | POA: Diagnosis present

## 2015-02-14 DIAGNOSIS — M4714 Other spondylosis with myelopathy, thoracic region: Secondary | ICD-10-CM | POA: Diagnosis not present

## 2015-02-14 DIAGNOSIS — G9529 Other cord compression: Secondary | ICD-10-CM | POA: Diagnosis not present

## 2015-02-14 DIAGNOSIS — M8448XA Pathological fracture, other site, initial encounter for fracture: Secondary | ICD-10-CM | POA: Diagnosis present

## 2015-02-14 DIAGNOSIS — G96198 Other disorders of meninges, not elsewhere classified: Secondary | ICD-10-CM | POA: Diagnosis present

## 2015-02-14 DIAGNOSIS — M8458XA Pathological fracture in neoplastic disease, other specified site, initial encounter for fracture: Secondary | ICD-10-CM | POA: Diagnosis present

## 2015-02-14 DIAGNOSIS — D0590 Unspecified type of carcinoma in situ of unspecified breast: Secondary | ICD-10-CM | POA: Diagnosis not present

## 2015-02-14 DIAGNOSIS — Z9221 Personal history of antineoplastic chemotherapy: Secondary | ICD-10-CM

## 2015-02-14 DIAGNOSIS — E876 Hypokalemia: Secondary | ICD-10-CM | POA: Diagnosis present

## 2015-02-14 DIAGNOSIS — C7951 Secondary malignant neoplasm of bone: Secondary | ICD-10-CM | POA: Diagnosis present

## 2015-02-14 DIAGNOSIS — I10 Essential (primary) hypertension: Secondary | ICD-10-CM | POA: Diagnosis present

## 2015-02-14 DIAGNOSIS — G839 Paralytic syndrome, unspecified: Secondary | ICD-10-CM | POA: Diagnosis not present

## 2015-02-14 DIAGNOSIS — M8448XD Pathological fracture, other site, subsequent encounter for fracture with routine healing: Secondary | ICD-10-CM | POA: Diagnosis not present

## 2015-02-14 DIAGNOSIS — Z87440 Personal history of urinary (tract) infections: Secondary | ICD-10-CM | POA: Diagnosis not present

## 2015-02-14 DIAGNOSIS — D62 Acute posthemorrhagic anemia: Secondary | ICD-10-CM | POA: Diagnosis not present

## 2015-02-14 DIAGNOSIS — Z419 Encounter for procedure for purposes other than remedying health state, unspecified: Secondary | ICD-10-CM

## 2015-02-14 DIAGNOSIS — R262 Difficulty in walking, not elsewhere classified: Secondary | ICD-10-CM

## 2015-02-14 DIAGNOSIS — N319 Neuromuscular dysfunction of bladder, unspecified: Secondary | ICD-10-CM | POA: Diagnosis not present

## 2015-02-14 DIAGNOSIS — Z79899 Other long term (current) drug therapy: Secondary | ICD-10-CM | POA: Diagnosis not present

## 2015-02-14 DIAGNOSIS — G8918 Other acute postprocedural pain: Secondary | ICD-10-CM | POA: Diagnosis not present

## 2015-02-14 DIAGNOSIS — M4804 Spinal stenosis, thoracic region: Secondary | ICD-10-CM | POA: Diagnosis not present

## 2015-02-14 DIAGNOSIS — K592 Neurogenic bowel, not elsewhere classified: Secondary | ICD-10-CM | POA: Diagnosis not present

## 2015-02-14 DIAGNOSIS — S24152A Other incomplete lesion at T2-T6 level of thoracic spinal cord, initial encounter: Secondary | ICD-10-CM | POA: Diagnosis not present

## 2015-02-14 DIAGNOSIS — C50419 Malignant neoplasm of upper-outer quadrant of unspecified female breast: Secondary | ICD-10-CM | POA: Diagnosis present

## 2015-02-14 DIAGNOSIS — Z9011 Acquired absence of right breast and nipple: Secondary | ICD-10-CM

## 2015-02-14 DIAGNOSIS — S22058A Other fracture of T5-T6 vertebra, initial encounter for closed fracture: Secondary | ICD-10-CM | POA: Diagnosis not present

## 2015-02-14 DIAGNOSIS — M50223 Other cervical disc displacement at C6-C7 level: Secondary | ICD-10-CM | POA: Diagnosis not present

## 2015-02-14 DIAGNOSIS — C7901 Secondary malignant neoplasm of right kidney and renal pelvis: Secondary | ICD-10-CM | POA: Diagnosis not present

## 2015-02-14 DIAGNOSIS — C412 Malignant neoplasm of vertebral column: Secondary | ICD-10-CM | POA: Diagnosis not present

## 2015-02-14 DIAGNOSIS — M532X4 Spinal instabilities, thoracic region: Secondary | ICD-10-CM | POA: Diagnosis not present

## 2015-02-14 DIAGNOSIS — R52 Pain, unspecified: Secondary | ICD-10-CM | POA: Diagnosis not present

## 2015-02-14 DIAGNOSIS — G9520 Unspecified cord compression: Secondary | ICD-10-CM | POA: Diagnosis not present

## 2015-02-14 DIAGNOSIS — R29898 Other symptoms and signs involving the musculoskeletal system: Secondary | ICD-10-CM | POA: Diagnosis not present

## 2015-02-14 DIAGNOSIS — R269 Unspecified abnormalities of gait and mobility: Secondary | ICD-10-CM | POA: Diagnosis not present

## 2015-02-14 DIAGNOSIS — D492 Neoplasm of unspecified behavior of bone, soft tissue, and skin: Secondary | ICD-10-CM | POA: Diagnosis present

## 2015-02-14 DIAGNOSIS — M50222 Other cervical disc displacement at C5-C6 level: Secondary | ICD-10-CM | POA: Diagnosis not present

## 2015-02-14 DIAGNOSIS — M5136 Other intervertebral disc degeneration, lumbar region: Secondary | ICD-10-CM | POA: Diagnosis not present

## 2015-02-14 DIAGNOSIS — G8220 Paraplegia, unspecified: Secondary | ICD-10-CM | POA: Diagnosis not present

## 2015-02-14 DIAGNOSIS — S22050A Wedge compression fracture of T5-T6 vertebra, initial encounter for closed fracture: Secondary | ICD-10-CM | POA: Diagnosis not present

## 2015-02-14 DIAGNOSIS — C50411 Malignant neoplasm of upper-outer quadrant of right female breast: Secondary | ICD-10-CM | POA: Diagnosis not present

## 2015-02-14 DIAGNOSIS — G992 Myelopathy in diseases classified elsewhere: Secondary | ICD-10-CM | POA: Diagnosis present

## 2015-02-14 DIAGNOSIS — R930 Abnormal findings on diagnostic imaging of skull and head, not elsewhere classified: Secondary | ICD-10-CM | POA: Diagnosis not present

## 2015-02-14 DIAGNOSIS — M6281 Muscle weakness (generalized): Secondary | ICD-10-CM | POA: Diagnosis not present

## 2015-02-14 DIAGNOSIS — C50919 Malignant neoplasm of unspecified site of unspecified female breast: Secondary | ICD-10-CM | POA: Diagnosis not present

## 2015-02-14 DIAGNOSIS — Z4789 Encounter for other orthopedic aftercare: Secondary | ICD-10-CM | POA: Diagnosis not present

## 2015-02-14 LAB — CBC WITH DIFFERENTIAL/PLATELET
Basophils Absolute: 0 10*3/uL (ref 0.0–0.1)
Basophils Relative: 0 %
Eosinophils Absolute: 0 10*3/uL (ref 0.0–0.7)
Eosinophils Relative: 0 %
HCT: 40.9 % (ref 36.0–46.0)
Hemoglobin: 14.3 g/dL (ref 12.0–15.0)
Lymphocytes Relative: 10 %
Lymphs Abs: 0.8 10*3/uL (ref 0.7–4.0)
MCH: 29.7 pg (ref 26.0–34.0)
MCHC: 35 g/dL (ref 30.0–36.0)
MCV: 84.9 fL (ref 78.0–100.0)
Monocytes Absolute: 0.4 10*3/uL (ref 0.1–1.0)
Monocytes Relative: 6 %
Neutro Abs: 6.6 10*3/uL (ref 1.7–7.7)
Neutrophils Relative %: 84 %
Platelets: 225 10*3/uL (ref 150–400)
RBC: 4.82 MIL/uL (ref 3.87–5.11)
RDW: 12.4 % (ref 11.5–15.5)
WBC: 7.8 10*3/uL (ref 4.0–10.5)

## 2015-02-14 LAB — COMPREHENSIVE METABOLIC PANEL
ALT: 18 U/L (ref 14–54)
AST: 32 U/L (ref 15–41)
Albumin: 4.3 g/dL (ref 3.5–5.0)
Alkaline Phosphatase: 92 U/L (ref 38–126)
Anion gap: 10 (ref 5–15)
BUN: 10 mg/dL (ref 6–20)
CO2: 28 mmol/L (ref 22–32)
Calcium: 10.2 mg/dL (ref 8.9–10.3)
Chloride: 96 mmol/L — ABNORMAL LOW (ref 101–111)
Creatinine, Ser: 0.89 mg/dL (ref 0.44–1.00)
GFR calc Af Amer: >60 mL/min (ref >60)
GFR calc non Af Amer: >60 mL/min (ref >60)
Glucose, Bld: 188 mg/dL — ABNORMAL HIGH (ref 65–99)
Potassium: 3.2 mmol/L — ABNORMAL LOW (ref 3.5–5.1)
Sodium: 134 mmol/L — ABNORMAL LOW (ref 135–145)
Total Bilirubin: 0.7 mg/dL (ref 0.3–1.2)
Total Protein: 7.9 g/dL (ref 6.5–8.1)

## 2015-02-14 LAB — URINALYSIS, ROUTINE W REFLEX MICROSCOPIC
Bilirubin Urine: NEGATIVE
Glucose, UA: NEGATIVE mg/dL
Ketones, ur: NEGATIVE mg/dL
Leukocytes, UA: NEGATIVE
Nitrite: NEGATIVE
Protein, ur: NEGATIVE mg/dL
Specific Gravity, Urine: 1.006 (ref 1.005–1.030)
Urobilinogen, UA: 0.2 mg/dL (ref 0.0–1.0)
pH: 7.5 (ref 5.0–8.0)

## 2015-02-14 LAB — CBC
HCT: 42.4 % (ref 36.0–46.0)
Hemoglobin: 14.5 g/dL (ref 12.0–15.0)
MCH: 29.4 pg (ref 26.0–34.0)
MCHC: 34.2 g/dL (ref 30.0–36.0)
MCV: 85.8 fL (ref 78.0–100.0)
Platelets: 217 10*3/uL (ref 150–400)
RBC: 4.94 MIL/uL (ref 3.87–5.11)
RDW: 12.7 % (ref 11.5–15.5)
WBC: 7.7 10*3/uL (ref 4.0–10.5)

## 2015-02-14 LAB — URINE MICROSCOPIC-ADD ON

## 2015-02-14 LAB — ETHANOL: Alcohol, Ethyl (B): <5 mg/dL (ref <5)

## 2015-02-14 MED ORDER — FAMOTIDINE 20 MG PO TABS
20.0000 mg | ORAL_TABLET | Freq: Two times a day (BID) | ORAL | Status: DC
  Administered 2015-02-15 – 2015-02-18 (×6): 20 mg via ORAL
  Filled 2015-02-14 (×6): qty 1

## 2015-02-14 MED ORDER — POTASSIUM CHLORIDE IN NACL 40-0.9 MEQ/L-% IV SOLN
INTRAVENOUS | Status: AC
  Administered 2015-02-15 – 2015-02-16 (×3): 75 mL/h via INTRAVENOUS
  Filled 2015-02-14 (×6): qty 1000

## 2015-02-14 MED ORDER — ONDANSETRON HCL 4 MG/2ML IJ SOLN: 4.0000 mg | Freq: Four times a day (QID) | INTRAMUSCULAR | Status: DC | PRN

## 2015-02-14 MED ORDER — SODIUM CHLORIDE 0.9 % IJ SOLN
3.0000 mL | Freq: Two times a day (BID) | INTRAMUSCULAR | Status: DC
  Administered 2015-02-15 – 2015-02-17 (×5): 3 mL via INTRAVENOUS

## 2015-02-14 MED ORDER — LETROZOLE 2.5 MG PO TABS
2.5000 mg | ORAL_TABLET | Freq: Every day | ORAL | Status: DC
Start: 2015-02-15 — End: 2015-02-18
  Administered 2015-02-15 – 2015-02-18 (×4): 2.5 mg via ORAL
  Filled 2015-02-14 (×4): qty 1

## 2015-02-14 MED ORDER — TRAMADOL HCL 50 MG PO TABS
50.0000 mg | ORAL_TABLET | ORAL | Status: DC | PRN
  Administered 2015-02-14 – 2015-02-15 (×2): 50 mg via ORAL
  Filled 2015-02-14 (×2): qty 1

## 2015-02-14 MED ORDER — HYDROMORPHONE HCL 1 MG/ML IJ SOLN: 1.0000 mg | INTRAMUSCULAR | Status: AC | PRN

## 2015-02-14 MED ORDER — METHOCARBAMOL 500 MG PO TABS
500.0000 mg | ORAL_TABLET | Freq: Three times a day (TID) | ORAL | Status: DC | PRN
  Administered 2015-02-16 (×2): 500 mg via ORAL
  Filled 2015-02-14: qty 1

## 2015-02-14 MED ORDER — SPIRONOLACTONE 25 MG PO TABS
12.5000 mg | ORAL_TABLET | Freq: Every day | ORAL | Status: DC
  Administered 2015-02-16 – 2015-02-18 (×3): 12.5 mg via ORAL
  Filled 2015-02-14 (×3): qty 1

## 2015-02-14 MED ORDER — TRAMADOL HCL 50 MG PO TABS: 50.0000 mg | ORAL_TABLET | Freq: Four times a day (QID) | ORAL | Status: DC | PRN

## 2015-02-14 MED ORDER — FAMOTIDINE 20 MG PO TABS
40.0000 mg | ORAL_TABLET | Freq: Once | ORAL | Status: AC
  Administered 2015-02-14: 40 mg via ORAL
  Filled 2015-02-14: qty 2

## 2015-02-14 MED ORDER — HYDROMORPHONE HCL 1 MG/ML IJ SOLN: 1.0000 mg | INTRAMUSCULAR | Status: DC | PRN

## 2015-02-14 MED ORDER — ENOXAPARIN SODIUM 30 MG/0.3ML ~~LOC~~ SOLN
30.0000 mg | SUBCUTANEOUS | Status: DC
  Administered 2015-02-15: 30 mg via SUBCUTANEOUS
  Filled 2015-02-14: qty 0.3

## 2015-02-14 MED ORDER — ONDANSETRON HCL 4 MG/2ML IJ SOLN: 4.0000 mg | Freq: Three times a day (TID) | INTRAMUSCULAR | Status: DC | PRN

## 2015-02-14 MED ORDER — POLYETHYLENE GLYCOL 3350 17 GM/SCOOP PO POWD: 1.0000 | Freq: Every day | ORAL | Status: DC | PRN

## 2015-02-14 MED ORDER — GADOBENATE DIMEGLUMINE 529 MG/ML IV SOLN
15.0000 mL | Freq: Once | INTRAVENOUS | Status: AC | PRN
  Administered 2015-02-14: 13 mL via INTRAVENOUS

## 2015-02-14 MED ORDER — POLYETHYLENE GLYCOL 3350 17 G PO PACK
17.0000 g | PACK | Freq: Every day | ORAL | Status: DC | PRN
Start: 2015-02-14 — End: 2015-02-18
  Administered 2015-02-17: 17 g via ORAL
  Filled 2015-02-14: qty 1

## 2015-02-14 MED ORDER — ONDANSETRON HCL 4 MG PO TABS: 4.0000 mg | ORAL_TABLET | Freq: Four times a day (QID) | ORAL | Status: DC | PRN

## 2015-02-14 NOTE — ED Notes (Signed)
MRI tech is being called in to perform ordered studies.

## 2015-02-14 NOTE — ED Notes (Signed)
Patient returned from MRI.

## 2015-02-14 NOTE — ED Notes (Addendum)
Post void residual via bladder scan = 4m.

## 2015-02-14 NOTE — H&P (Signed)
[facility] History and Physical  Heather [name] YIR:[id number] DOB: Jul 11, 1945 DOA: 02/14/2015  Referring physician: Virgel Manifold, MD PCP: Vidal Schwalbe, MD   Chief Complaint: Legs weakness  HPI: Heather [name] is a 69 y.o. female with a past medical history of breast cancer, S/P mastectomy, Hyperlipidemia, Hypertension, history of UTI (urinary tract infection), GERD, skin cancer who comes to the emergency department due to progressively worse bilateral lower extremity numbness and weakness for about a week. She states that since July she has been having pain on her right sided chest wall, where she had her mastectomy, and was treated with tramadol and methocarbamol. However, earlier this week she felt that her pain went all around her chest, noticed some decreased sensation while she was rubbing her cough, oral skin preparation on her trunk. Since then, she initially felt like she could not keep her balance, her legs were weak and not responding normally. This progressed to the point that she required a walker to ambulate, and this morning she was so weak that she was unable to ambulate at all. She denies headache, fever, dyspnea, dizziness, diaphoresis, nausea or emesis. She states that she has had some difficulty initiating urination, but denies urinary or fecal incontinence.  Today since she was unable to walk, she decided to come to the emergency department, where workup has revealed a pathological T5 vertebrae fracture. When seen, she was in no acute distress.   Review of Systems:  Constitutional:  No weight loss, night sweats, Fevers, chills, fatigue.  HEENT:  No headaches, Difficulty swallowing,Tooth/dental problems,Sore throat,  No sneezing, itching, ear ache, nasal congestion, post nasal drip,  Cardio-vascular:  No chest pain, Orthopnea, PND, swelling in lower extremities, anasarca, dizziness, palpitations  GI:  No heartburn, indigestion, abdominal pain, nausea, vomiting,  diarrhea, change in bowel habits, loss of appetite  Resp:  No shortness of breath with exertion or at rest. No excess mucus, no productive cough, No non-productive cough, No coughing up of blood.No change in color of mucus.No wheezing.No chest wall deformity  Skin:  no rash or lesions.  GU:  no dysuria, change in color of urine, no urgency or frequency. No flank pain.  Musculoskeletal:  Positive midthoracic back pain.  No joint pain or swelling. No decreased range of motion.  Psych:  No change in mood or affect. No depression or anxiety. No memory loss.  Neuro: Decreased sensation and strength on lower extremities. No fecal or urinary incontinence. Past Medical History  Diagnosis Date  . Chronic kidney disease   . Hx: UTI (urinary tract infection)   . Right shoulder pain   . Right knee pain   . Hypertension     recently started Aldactone   . Hyperlipidemia     but not on meds;diet and exercise controlled  . Dizziness     has been going on for 56month;medical MD aware  . H/O hiatal hernia   . GERD (gastroesophageal reflux disease)     Tums prn  . Hemorrhoids   . Urinary frequency     d/t taking Aldactone  . History of UTI   . Anemia     hx of  . Cataract     immature-not sure of which eye  . Anxiety     r/t updated surgery  . Insomnia     takes Melatoniin daily  . Complication of anesthesia     pt states she is sensitive to meds  . [place]  . Skin cancer   . Hx of radiation therapy 10/25/12- 12/11/12    right chest wall/regional lymph nodes 5040 cGy, 28 sessions, right chest wall boost 1000 cGy 1 session  . H. pylori infection    Past Surgical History  Procedure Laterality Date  . Cyst removed from left breast  1970  . Left wrist surgery   2004    with plate  . Colonoscopy    . Esophagogastroduodenoscopy    . Mastectomy, radical    . Mastectomy modified radical  05/10/2012    Procedure: MASTECTOMY MODIFIED RADICAL;  Surgeon: Merrie Roof,  MD;  Location: [place];  Service: General;  Laterality: Right;  RIGHT MODIFIED RADICAL MASTECTOMY  . Portacath placement  05/10/2012    Procedure: INSERTION PORT-A-CATH;  Surgeon: Merrie Roof, MD;  Location: [place];  Service: General;  Laterality: Left;   Social History:  reports that she has never smoked. She has never used smokeless tobacco. She reports that she does not drink alcohol or use illicit drugs.  Allergies  Allergen Reactions  . Codeine     " loopy"  . Keflex [Cephalexin]     GI Upset  . Naprosyn [Naproxen] Other (See Comments)    GI ipset   . Tussin [Guaifenesin] Other (See Comments)    Dizzy     Family History  Problem Relation Age of Onset  . Leukemia Mother   . Colon cancer Brother   . Cancer Brother     Prostate  . Colon cancer Maternal Grandmother   . Stroke Father   . Diabetes Mellitus II Father      Prior to Admission medications   Medication Sig Start Date End Date Taking? Authorizing Provider  B Complex-C (SUPER B COMPLEX PO) Take 1 each by mouth daily.   Yes Historical Provider, MD  letrozole (FEMARA) 2.5 MG tablet Take 1 tablet (2.5 mg total) by mouth daily. 10/27/14  Yes [name], MD  methocarbamol (ROBAXIN) 500 MG tablet Take 500 mg by mouth daily as needed for muscle spasms.   Yes Historical Provider, MD  polyethylene glycol powder (GLYCOLAX/MIRALAX) powder Take 1 Container by mouth daily as needed for mild constipation.   Yes Historical Provider, MD  Probiotic Product (PROBIOTIC DAILY PO) Take 1 capsule by mouth daily.    Yes Historical Provider, MD  ranitidine (ZANTAC) 300 MG tablet Take 300 mg by mouth daily.   Yes Historical Provider, MD  spironolactone (ALDACTONE) 25 MG tablet Take 0.5 tablets (12.5 mg total) by mouth daily. 04/15/13  Yes Minette Headland, NP  traMADol (ULTRAM) 50 MG tablet Take 50 mg by mouth every 6 (six) hours as needed for moderate pain.   Yes Historical Provider, MD  UNABLE TO FIND Rx: L8000- Post Surgical Bra  (Quantity: 6) Z6109- Silicone Breast Prosthesis (Quantity: 1) Dx: 174.9; Right mastectomy 12/10/13  Yes Autumn Messing III, MD  furosemide (LASIX) 20 MG tablet Take 1 tablet (20 mg total) by mouth daily as needed. Patient not taking: Reported on 01/11/2015 11/16/12   Jolaine Artist, MD  pravastatin (PRAVACHOL) 10 MG tablet Take 10 mg by mouth daily.  03/14/14   Historical Provider, MD   Physical Exam: Filed Vitals:   02/14/15 1331 02/14/15 1723  BP: 138/71 148/75  Pulse: 82 93  Resp: 18 18  Height: '5\' 4"'$  (1.626 m)   Weight: 58.968 kg (130 lb)   SpO2: 100% 100%    Wt Readings from Last 3 Encounters:  02/14/15 58.968 kg (130 lb)  02/14/15 58.968 kg (130 lb)  02/14/15 58.968 kg (130 lb)    General:  Appears calm and comfortable Eyes: PERRL, normal lids, irises & conjunctiva ENT: grossly normal hearing, lips & tongue Neck: no LAD, masses or thyromegaly Cardiovascular: RRR, no m/r/g. No LE edema. Telemetry: SR, no arrhythmias  Respiratory: CTA bilaterally, no w/r/r. Normal respiratory effort. Abdomen: soft, ntnd Skin: no rash or induration seen on limited exam Musculoskeletal: Decreased muscle tone in lower extremities. Psychiatric: grossly normal mood and affect, speech fluent and appropriate Neurologic: Decreased sensorium from her upper abdomen down. Decreased sensorium and motor strength on both extremities.          Labs on Admission:  Basic Metabolic Panel:  Recent Labs Lab 02/14/15 1309  NA 134*  K 3.2*  CL 96*  CO2 28  GLUCOSE 188*  BUN 10  CREATININE 0.89  CALCIUM 10.2   Liver Function Tests:  Recent Labs Lab 02/14/15 1309  AST 32  ALT 18  ALKPHOS 92  BILITOT 0.7  PROT 7.9  ALBUMIN 4.3   CBC:  Recent Labs Lab 02/14/15 1309  WBC 7.8  NEUTROABS 6.6  HGB 14.3  HCT 40.9  MCV 84.9  PLT 225    Radiological Exams on Admission: Ct Head Wo Contrast  02/14/2015   CLINICAL DATA:  Bilateral lower extremity weakness.  EXAM: CT HEAD WITHOUT CONTRAST   TECHNIQUE: Contiguous axial images were obtained from the base of the skull through the vertex without intravenous contrast.  COMPARISON:  MRI scan of March 22, 2012.  FINDINGS: Bony calvarium appears intact. No mass effect or midline shift is noted. Ventricular size is within normal limits. There is no evidence of mass lesion, hemorrhage or acute infarction.  IMPRESSION: Normal head CT.   Electronically Signed   By: Marijo Conception, M.D.   On: 02/14/2015 14:24   Mr Jeri Cos LK Contrast  02/14/2015   CLINICAL DATA:  Bilateral leg weakness and numbness for 1 week. History of breast cancer status post mastectomy in 2014.  EXAM: MRI HEAD WITHOUT AND WITH CONTRAST; MRI CERVICAL SPINE WITHOUT AND WITH CONTRAST; MRI LUMBAR SPINE WITHOUT AND WITH CONTRAST; MRI THORACIC SPINE WITHOUT AND WITH CONTRAST  TECHNIQUE: Multiplanar, multiecho pulse sequences of the brain and surrounding structures were obtained according to standard protocol without and with intravenous contrast; Multiplanar and multiecho pulse sequences of the cervical spine, to include the craniocervical junction and cervicothoracic junction, were obtained according to standard protocol without and with intravenous contrast.; Multiplanar and multiecho pulse sequences of the lumbar spine were obtained without and with intravenous contrast.; Multiplanar and multiecho pulse sequences of the thoracic spine were obtained without and with intravenous contrast.  CONTRAST:  66m MULTIHANCE GADOBENATE DIMEGLUMINE 529 MG/ML IV SOLN  COMPARISON:  Head CT 02/14/2015. CT abdomen and pelvis 02/06/2015. Brain MRI and PET-CT 03/22/2012.  FINDINGS: MRI HEAD:  There is no evidence of acute infarct, intracranial hemorrhage, mass, midline shift, or extra-axial fluid collection. Ventricles and sulci are normal. Punctate foci of T2 hyperintensity in the subcortical cerebral white matter bilaterally are similar to the prior MRI and nonspecific but may reflect minimal chronic  small vessel ischemic disease. No abnormal enhancement is identified.  Orbits are unremarkable. Paranasal sinuses and mastoid air cells are clear. Major intracranial vascular flow voids are preserved. No suspicious skull lesions are identified.  MRI CERVICAL SPINE:  Vertebral alignment is normal. Vertebral body heights are preserved. There is mild-to-moderate disc space narrowing at  C5-6 with associated degenerative endplate changes. No lesions suspicious for metastatic disease are identified in the cervical spine. There is mild disc bulging at C5-6 and C6-7 without evidence of significant stenosis. Paraspinal soft tissues are unremarkable.  MRI THORACIC SPINE:  There is a severe pathologic compression fracture at T5 with focal kyphosis at this level. Tumor diffusely involves the T5 vertebral body and posterior elements and there is extensive circumferential epidural tumor in the spinal canal resulting in severe spinal stenosis and moderate cord compression. No substantial spinal cord edema is identified. Tumor also involves the right T4 inferior articular process and lamina and extends into the right greater than left neural foramina at T4-5 and T5-6 with associated foraminal stenosis. There is paravertebral soft tissue tumor at T5.  There is a 6 mm T2 hyperintense enhancing focus along the T6 superior endplate, and there is a faint T2 hyperintense and mildly enhancing focus in the inferior T12 vertebral body measuring 8 mm. No epidural tumor is identified elsewhere in the thoracic spine. Thoracic vertebral body heights are preserved elsewhere, and there is no significant thoracic spine degenerative change. Mild dependent atelectasis is present in the lungs.  MRI LUMBAR SPINE:  Vertebral alignment is normal. Vertebral body heights and intervertebral disc space heights are preserved. There is diffuse disc desiccation with mild multilevel type 2 degenerative endplate marrow changes.  A 1 cm lesion in the L5 vertebral  body is favored to represent a hemangioma. There are 6 mm T2 hyperintense, mildly enhancing foci along the L1 and L2 inferior endplates. No destructive osseous lesion or epidural tumor is seen in the lumbar spine. Conus medullaris is normal in signal and terminates at L1-2. There is no significant disc or facet degeneration, and there is no lumbar stenosis.  IMPRESSION: 1. Large, destructive T5 lesion with severe pathologic compression fracture and extensive epidural tumor. Severe spinal stenosis with moderate cord compression. 2. Posterior element tumor involvement at T4. 3. Small, scattered foci of mild enhancement elsewhere in the thoracic and lumbar spine as above, indeterminate with considerations including degenerative changes and small metastases. 4. No evidence of acute intracranial abnormality or intracranial metastatic disease. 5. No evidence of metastatic disease in the cervical spine. Critical Value/emergent results were called by telephone at the time of interpretation on 02/14/2015 at 5:55 pm to Dr. Deno Etienne , who verbally acknowledged these results.   Electronically Signed   By: Logan Bores M.D.   On: 02/14/2015 17:57   Mr Cervical Spine W Wo Contrast  02/14/2015   CLINICAL DATA:  Bilateral leg weakness and numbness for 1 week. History of breast cancer status post mastectomy in 2014.  EXAM: MRI HEAD WITHOUT AND WITH CONTRAST; MRI CERVICAL SPINE WITHOUT AND WITH CONTRAST; MRI LUMBAR SPINE WITHOUT AND WITH CONTRAST; MRI THORACIC SPINE WITHOUT AND WITH CONTRAST  TECHNIQUE: Multiplanar, multiecho pulse sequences of the brain and surrounding structures were obtained according to standard protocol without and with intravenous contrast; Multiplanar and multiecho pulse sequences of the cervical spine, to include the craniocervical junction and cervicothoracic junction, were obtained according to standard protocol without and with intravenous contrast.; Multiplanar and multiecho pulse sequences of the  lumbar spine were obtained without and with intravenous contrast.; Multiplanar and multiecho pulse sequences of the thoracic spine were obtained without and with intravenous contrast.  CONTRAST:  58m MULTIHANCE GADOBENATE DIMEGLUMINE 529 MG/ML IV SOLN  COMPARISON:  Head CT 02/14/2015. CT abdomen and pelvis 02/06/2015. Brain MRI and PET-CT 03/22/2012.  FINDINGS: MRI HEAD:  There  is no evidence of acute infarct, intracranial hemorrhage, mass, midline shift, or extra-axial fluid collection. Ventricles and sulci are normal. Punctate foci of T2 hyperintensity in the subcortical cerebral white matter bilaterally are similar to the prior MRI and nonspecific but may reflect minimal chronic small vessel ischemic disease. No abnormal enhancement is identified.  Orbits are unremarkable. Paranasal sinuses and mastoid air cells are clear. Major intracranial vascular flow voids are preserved. No suspicious skull lesions are identified.  MRI CERVICAL SPINE:  Vertebral alignment is normal. Vertebral body heights are preserved. There is mild-to-moderate disc space narrowing at C5-6 with associated degenerative endplate changes. No lesions suspicious for metastatic disease are identified in the cervical spine. There is mild disc bulging at C5-6 and C6-7 without evidence of significant stenosis. Paraspinal soft tissues are unremarkable.  MRI THORACIC SPINE:  There is a severe pathologic compression fracture at T5 with focal kyphosis at this level. Tumor diffusely involves the T5 vertebral body and posterior elements and there is extensive circumferential epidural tumor in the spinal canal resulting in severe spinal stenosis and moderate cord compression. No substantial spinal cord edema is identified. Tumor also involves the right T4 inferior articular process and lamina and extends into the right greater than left neural foramina at T4-5 and T5-6 with associated foraminal stenosis. There is paravertebral soft tissue tumor at T5.   There is a 6 mm T2 hyperintense enhancing focus along the T6 superior endplate, and there is a faint T2 hyperintense and mildly enhancing focus in the inferior T12 vertebral body measuring 8 mm. No epidural tumor is identified elsewhere in the thoracic spine. Thoracic vertebral body heights are preserved elsewhere, and there is no significant thoracic spine degenerative change. Mild dependent atelectasis is present in the lungs.  MRI LUMBAR SPINE:  Vertebral alignment is normal. Vertebral body heights and intervertebral disc space heights are preserved. There is diffuse disc desiccation with mild multilevel type 2 degenerative endplate marrow changes.  A 1 cm lesion in the L5 vertebral body is favored to represent a hemangioma. There are 6 mm T2 hyperintense, mildly enhancing foci along the L1 and L2 inferior endplates. No destructive osseous lesion or epidural tumor is seen in the lumbar spine. Conus medullaris is normal in signal and terminates at L1-2. There is no significant disc or facet degeneration, and there is no lumbar stenosis.  IMPRESSION: 1. Large, destructive T5 lesion with severe pathologic compression fracture and extensive epidural tumor. Severe spinal stenosis with moderate cord compression. 2. Posterior element tumor involvement at T4. 3. Small, scattered foci of mild enhancement elsewhere in the thoracic and lumbar spine as above, indeterminate with considerations including degenerative changes and small metastases. 4. No evidence of acute intracranial abnormality or intracranial metastatic disease. 5. No evidence of metastatic disease in the cervical spine. Critical Value/emergent results were called by telephone at the time of interpretation on 02/14/2015 at 5:55 pm to Dr. Deno Etienne , who verbally acknowledged these results.   Electronically Signed   By: Logan Bores M.D.   On: 02/14/2015 17:57   Mr Thoracic Spine W Wo Contrast  02/14/2015   CLINICAL DATA:  Bilateral leg weakness and numbness  for 1 week. History of breast cancer status post mastectomy in 2014.  EXAM: MRI HEAD WITHOUT AND WITH CONTRAST; MRI CERVICAL SPINE WITHOUT AND WITH CONTRAST; MRI LUMBAR SPINE WITHOUT AND WITH CONTRAST; MRI THORACIC SPINE WITHOUT AND WITH CONTRAST  TECHNIQUE: Multiplanar, multiecho pulse sequences of the brain and surrounding structures were obtained according to  standard protocol without and with intravenous contrast; Multiplanar and multiecho pulse sequences of the cervical spine, to include the craniocervical junction and cervicothoracic junction, were obtained according to standard protocol without and with intravenous contrast.; Multiplanar and multiecho pulse sequences of the lumbar spine were obtained without and with intravenous contrast.; Multiplanar and multiecho pulse sequences of the thoracic spine were obtained without and with intravenous contrast.  CONTRAST:  59m MULTIHANCE GADOBENATE DIMEGLUMINE 529 MG/ML IV SOLN  COMPARISON:  Head CT 02/14/2015. CT abdomen and pelvis 02/06/2015. Brain MRI and PET-CT 03/22/2012.  FINDINGS: MRI HEAD:  There is no evidence of acute infarct, intracranial hemorrhage, mass, midline shift, or extra-axial fluid collection. Ventricles and sulci are normal. Punctate foci of T2 hyperintensity in the subcortical cerebral white matter bilaterally are similar to the prior MRI and nonspecific but may reflect minimal chronic small vessel ischemic disease. No abnormal enhancement is identified.  Orbits are unremarkable. Paranasal sinuses and mastoid air cells are clear. Major intracranial vascular flow voids are preserved. No suspicious skull lesions are identified.  MRI CERVICAL SPINE:  Vertebral alignment is normal. Vertebral body heights are preserved. There is mild-to-moderate disc space narrowing at C5-6 with associated degenerative endplate changes. No lesions suspicious for metastatic disease are identified in the cervical spine. There is mild disc bulging at C5-6 and C6-7  without evidence of significant stenosis. Paraspinal soft tissues are unremarkable.  MRI THORACIC SPINE:  There is a severe pathologic compression fracture at T5 with focal kyphosis at this level. Tumor diffusely involves the T5 vertebral body and posterior elements and there is extensive circumferential epidural tumor in the spinal canal resulting in severe spinal stenosis and moderate cord compression. No substantial spinal cord edema is identified. Tumor also involves the right T4 inferior articular process and lamina and extends into the right greater than left neural foramina at T4-5 and T5-6 with associated foraminal stenosis. There is paravertebral soft tissue tumor at T5.  There is a 6 mm T2 hyperintense enhancing focus along the T6 superior endplate, and there is a faint T2 hyperintense and mildly enhancing focus in the inferior T12 vertebral body measuring 8 mm. No epidural tumor is identified elsewhere in the thoracic spine. Thoracic vertebral body heights are preserved elsewhere, and there is no significant thoracic spine degenerative change. Mild dependent atelectasis is present in the lungs.  MRI LUMBAR SPINE:  Vertebral alignment is normal. Vertebral body heights and intervertebral disc space heights are preserved. There is diffuse disc desiccation with mild multilevel type 2 degenerative endplate marrow changes.  A 1 cm lesion in the L5 vertebral body is favored to represent a hemangioma. There are 6 mm T2 hyperintense, mildly enhancing foci along the L1 and L2 inferior endplates. No destructive osseous lesion or epidural tumor is seen in the lumbar spine. Conus medullaris is normal in signal and terminates at L1-2. There is no significant disc or facet degeneration, and there is no lumbar stenosis.  IMPRESSION: 1. Large, destructive T5 lesion with severe pathologic compression fracture and extensive epidural tumor. Severe spinal stenosis with moderate cord compression. 2. Posterior element tumor  involvement at T4. 3. Small, scattered foci of mild enhancement elsewhere in the thoracic and lumbar spine as above, indeterminate with considerations including degenerative changes and small metastases. 4. No evidence of acute intracranial abnormality or intracranial metastatic disease. 5. No evidence of metastatic disease in the cervical spine. Critical Value/emergent results were called by telephone at the time of interpretation on 02/14/2015 at 5:55 pm to Dr. DLinna Hoff  FLOYD , who verbally acknowledged these results.   Electronically Signed   By: Logan Bores M.D.   On: 02/14/2015 17:57   Mr Lumbar Spine W Wo Contrast  02/14/2015   CLINICAL DATA:  Bilateral leg weakness and numbness for 1 week. History of breast cancer status post mastectomy in 2014.  EXAM: MRI HEAD WITHOUT AND WITH CONTRAST; MRI CERVICAL SPINE WITHOUT AND WITH CONTRAST; MRI LUMBAR SPINE WITHOUT AND WITH CONTRAST; MRI THORACIC SPINE WITHOUT AND WITH CONTRAST  TECHNIQUE: Multiplanar, multiecho pulse sequences of the brain and surrounding structures were obtained according to standard protocol without and with intravenous contrast; Multiplanar and multiecho pulse sequences of the cervical spine, to include the craniocervical junction and cervicothoracic junction, were obtained according to standard protocol without and with intravenous contrast.; Multiplanar and multiecho pulse sequences of the lumbar spine were obtained without and with intravenous contrast.; Multiplanar and multiecho pulse sequences of the thoracic spine were obtained without and with intravenous contrast.  CONTRAST:  45m MULTIHANCE GADOBENATE DIMEGLUMINE 529 MG/ML IV SOLN  COMPARISON:  Head CT 02/14/2015. CT abdomen and pelvis 02/06/2015. Brain MRI and PET-CT 03/22/2012.  FINDINGS: MRI HEAD:  There is no evidence of acute infarct, intracranial hemorrhage, mass, midline shift, or extra-axial fluid collection. Ventricles and sulci are normal. Punctate foci of T2 hyperintensity in  the subcortical cerebral white matter bilaterally are similar to the prior MRI and nonspecific but may reflect minimal chronic small vessel ischemic disease. No abnormal enhancement is identified.  Orbits are unremarkable. Paranasal sinuses and mastoid air cells are clear. Major intracranial vascular flow voids are preserved. No suspicious skull lesions are identified.  MRI CERVICAL SPINE:  Vertebral alignment is normal. Vertebral body heights are preserved. There is mild-to-moderate disc space narrowing at C5-6 with associated degenerative endplate changes. No lesions suspicious for metastatic disease are identified in the cervical spine. There is mild disc bulging at C5-6 and C6-7 without evidence of significant stenosis. Paraspinal soft tissues are unremarkable.  MRI THORACIC SPINE:  There is a severe pathologic compression fracture at T5 with focal kyphosis at this level. Tumor diffusely involves the T5 vertebral body and posterior elements and there is extensive circumferential epidural tumor in the spinal canal resulting in severe spinal stenosis and moderate cord compression. No substantial spinal cord edema is identified. Tumor also involves the right T4 inferior articular process and lamina and extends into the right greater than left neural foramina at T4-5 and T5-6 with associated foraminal stenosis. There is paravertebral soft tissue tumor at T5.  There is a 6 mm T2 hyperintense enhancing focus along the T6 superior endplate, and there is a faint T2 hyperintense and mildly enhancing focus in the inferior T12 vertebral body measuring 8 mm. No epidural tumor is identified elsewhere in the thoracic spine. Thoracic vertebral body heights are preserved elsewhere, and there is no significant thoracic spine degenerative change. Mild dependent atelectasis is present in the lungs.  MRI LUMBAR SPINE:  Vertebral alignment is normal. Vertebral body heights and intervertebral disc space heights are preserved. There  is diffuse disc desiccation with mild multilevel type 2 degenerative endplate marrow changes.  A 1 cm lesion in the L5 vertebral body is favored to represent a hemangioma. There are 6 mm T2 hyperintense, mildly enhancing foci along the L1 and L2 inferior endplates. No destructive osseous lesion or epidural tumor is seen in the lumbar spine. Conus medullaris is normal in signal and terminates at L1-2. There is no significant disc or facet degeneration, and there  is no lumbar stenosis.  IMPRESSION: 1. Large, destructive T5 lesion with severe pathologic compression fracture and extensive epidural tumor. Severe spinal stenosis with moderate cord compression. 2. Posterior element tumor involvement at T4. 3. Small, scattered foci of mild enhancement elsewhere in the thoracic and lumbar spine as above, indeterminate with considerations including degenerative changes and small metastases. 4. No evidence of acute intracranial abnormality or intracranial metastatic disease. 5. No evidence of metastatic disease in the cervical spine. Critical Value/emergent results were called by telephone at the time of interpretation on 02/14/2015 at 5:55 pm to Dr. Deno Etienne , who verbally acknowledged these results.   Electronically Signed   By: Logan Bores M.D.   On: 02/14/2015 17:57    EKG: Independently reviewed. Vent. rate 98 BPM PR interval 175 ms QRS duration 87 ms QT/QTc 346/442 ms P-R-T axes 76 40 56 Sinus rhythm Probable left atrial enlargement Borderline T abnormalities, anterior leads  Assessment/Plan Principal Problem:   Pathologic fracture of thoracic vertebrae Admit to [facility] hospital for pain management and evaluation by neurosurgery. Further treatment options once she is seen by neurosurgery. She may require a procedure for stabilization of the pine, if deemed appropriate by neurosurgery.   Active Problems:     HTN (hypertension) Monitor blood pressure, continue furosemide and Aldactone.    Breast  cancer metastasized to bone [facility]) Continue treatment as per oncology.    Hyperlipidemia Continue pravastatin and monitor LFTs. Continue dietary measures.    Hypokalemia. Continue potassium replacement and recheck potassium level in the morning.    Code Status: Full code. DVT Prophylaxis: Lovenox SQ. Family Communication:  Raigen, Jagielski [phone]  Disposition Plan: Admit to [facility] for neurosurgery evaluation.  Time spent: Over 70 minutes were spent during the process of this admission.  Reubin Milan [facility] Pager [phone].

## 2015-02-14 NOTE — ED Notes (Signed)
Pt has complaints of bilateral leg and abdominal weakness and numbness x 1 week.  Pt states she was seen at [facility] walk in clinic for "spasms and inflammation of the cartilage of my sternum and ribcage", and was started on Robaxin and Ultram 10 days ago.  Pt states after taking medications for 2-3 days she began having weakness in legs that has progressively worsened since onset.  Pt states she is unable to bear weight due to leg weakness.  Pt also c/o decreased sensation to BLE. Pt denies headache, chest pain.

## 2015-02-14 NOTE — ED Notes (Signed)
Attempt to call report to [facility] x1

## 2015-02-14 NOTE — ED Notes (Signed)
Hospitalist at bedside 

## 2015-02-14 NOTE — ED Notes (Signed)
Patient in MRI 

## 2015-02-14 NOTE — ED Notes (Addendum)
Lab delay - RN about to start a IV.

## 2015-02-14 NOTE — ED Provider Notes (Signed)
6:32 PM Unfortunately, imaging as below. Discussed with Dr Kathyrn Sheriff, neurosurgery. Requesting transfer to [facility] to hospitalist service and will see in consultation.   Ct Head Wo Contrast  02/14/2015   CLINICAL DATA:  Bilateral lower extremity weakness.  EXAM: CT HEAD WITHOUT CONTRAST  TECHNIQUE: Contiguous axial images were obtained from the base of the skull through the vertex without intravenous contrast.  COMPARISON:  MRI scan of March 22, 2012.  FINDINGS: Bony calvarium appears intact. No mass effect or midline shift is noted. Ventricular size is within normal limits. There is no evidence of mass lesion, hemorrhage or acute infarction.  IMPRESSION: Normal head CT.   Electronically Signed   By: Marijo Conception, M.D.   On: 02/14/2015 14:24   Mr Jeri Cos OX Contrast  02/14/2015   CLINICAL DATA:  Bilateral leg weakness and numbness for 1 week. History of breast cancer status post mastectomy in 2014.  EXAM: MRI HEAD WITHOUT AND WITH CONTRAST; MRI CERVICAL SPINE WITHOUT AND WITH CONTRAST; MRI LUMBAR SPINE WITHOUT AND WITH CONTRAST; MRI THORACIC SPINE WITHOUT AND WITH CONTRAST  TECHNIQUE: Multiplanar, multiecho pulse sequences of the brain and surrounding structures were obtained according to standard protocol without and with intravenous contrast; Multiplanar and multiecho pulse sequences of the cervical spine, to include the craniocervical junction and cervicothoracic junction, were obtained according to standard protocol without and with intravenous contrast.; Multiplanar and multiecho pulse sequences of the lumbar spine were obtained without and with intravenous contrast.; Multiplanar and multiecho pulse sequences of the thoracic spine were obtained without and with intravenous contrast.  CONTRAST:  40m MULTIHANCE GADOBENATE DIMEGLUMINE 529 MG/ML IV SOLN  COMPARISON:  Head CT 02/14/2015. CT abdomen and pelvis 02/06/2015. Brain MRI and PET-CT 03/22/2012.  FINDINGS: MRI HEAD:  There is no evidence of acute  infarct, intracranial hemorrhage, mass, midline shift, or extra-axial fluid collection. Ventricles and sulci are normal. Punctate foci of T2 hyperintensity in the subcortical cerebral white matter bilaterally are similar to the prior MRI and nonspecific but may reflect minimal chronic small vessel ischemic disease. No abnormal enhancement is identified.  Orbits are unremarkable. Paranasal sinuses and mastoid air cells are clear. Major intracranial vascular flow voids are preserved. No suspicious skull lesions are identified.  MRI CERVICAL SPINE:  Vertebral alignment is normal. Vertebral body heights are preserved. There is mild-to-moderate disc space narrowing at C5-6 with associated degenerative endplate changes. No lesions suspicious for metastatic disease are identified in the cervical spine. There is mild disc bulging at C5-6 and C6-7 without evidence of significant stenosis. Paraspinal soft tissues are unremarkable.  MRI THORACIC SPINE:  There is a severe pathologic compression fracture at T5 with focal kyphosis at this level. Tumor diffusely involves the T5 vertebral body and posterior elements and there is extensive circumferential epidural tumor in the spinal canal resulting in severe spinal stenosis and moderate cord compression. No substantial spinal cord edema is identified. Tumor also involves the right T4 inferior articular process and lamina and extends into the right greater than left neural foramina at T4-5 and T5-6 with associated foraminal stenosis. There is paravertebral soft tissue tumor at T5.  There is a 6 mm T2 hyperintense enhancing focus along the T6 superior endplate, and there is a faint T2 hyperintense and mildly enhancing focus in the inferior T12 vertebral body measuring 8 mm. No epidural tumor is identified elsewhere in the thoracic spine. Thoracic vertebral body heights are preserved elsewhere, and there is no significant thoracic spine degenerative change. Mild dependent atelectasis  is present in the lungs.  MRI LUMBAR SPINE:  Vertebral alignment is normal. Vertebral body heights and intervertebral disc space heights are preserved. There is diffuse disc desiccation with mild multilevel type 2 degenerative endplate marrow changes.  A 1 cm lesion in the L5 vertebral body is favored to represent a hemangioma. There are 6 mm T2 hyperintense, mildly enhancing foci along the L1 and L2 inferior endplates. No destructive osseous lesion or epidural tumor is seen in the lumbar spine. Conus medullaris is normal in signal and terminates at L1-2. There is no significant disc or facet degeneration, and there is no lumbar stenosis.  IMPRESSION: 1. Large, destructive T5 lesion with severe pathologic compression fracture and extensive epidural tumor. Severe spinal stenosis with moderate cord compression. 2. Posterior element tumor involvement at T4. 3. Small, scattered foci of mild enhancement elsewhere in the thoracic and lumbar spine as above, indeterminate with considerations including degenerative changes and small metastases. 4. No evidence of acute intracranial abnormality or intracranial metastatic disease. 5. No evidence of metastatic disease in the cervical spine. Critical Value/emergent results were called by telephone at the time of interpretation on 02/14/2015 at 5:55 pm to Dr. Deno Etienne , who verbally acknowledged these results.   Electronically Signed   By: Logan Bores M.D.   On: 02/14/2015 17:57   Mr Cervical Spine W Wo Contrast  02/14/2015   CLINICAL DATA:  Bilateral leg weakness and numbness for 1 week. History of breast cancer status post mastectomy in 2014.  EXAM: MRI HEAD WITHOUT AND WITH CONTRAST; MRI CERVICAL SPINE WITHOUT AND WITH CONTRAST; MRI LUMBAR SPINE WITHOUT AND WITH CONTRAST; MRI THORACIC SPINE WITHOUT AND WITH CONTRAST  TECHNIQUE: Multiplanar, multiecho pulse sequences of the brain and surrounding structures were obtained according to standard protocol without and with  intravenous contrast; Multiplanar and multiecho pulse sequences of the cervical spine, to include the craniocervical junction and cervicothoracic junction, were obtained according to standard protocol without and with intravenous contrast.; Multiplanar and multiecho pulse sequences of the lumbar spine were obtained without and with intravenous contrast.; Multiplanar and multiecho pulse sequences of the thoracic spine were obtained without and with intravenous contrast.  CONTRAST:  74m MULTIHANCE GADOBENATE DIMEGLUMINE 529 MG/ML IV SOLN  COMPARISON:  Head CT 02/14/2015. CT abdomen and pelvis 02/06/2015. Brain MRI and PET-CT 03/22/2012.  FINDINGS: MRI HEAD:  There is no evidence of acute infarct, intracranial hemorrhage, mass, midline shift, or extra-axial fluid collection. Ventricles and sulci are normal. Punctate foci of T2 hyperintensity in the subcortical cerebral white matter bilaterally are similar to the prior MRI and nonspecific but may reflect minimal chronic small vessel ischemic disease. No abnormal enhancement is identified.  Orbits are unremarkable. Paranasal sinuses and mastoid air cells are clear. Major intracranial vascular flow voids are preserved. No suspicious skull lesions are identified.  MRI CERVICAL SPINE:  Vertebral alignment is normal. Vertebral body heights are preserved. There is mild-to-moderate disc space narrowing at C5-6 with associated degenerative endplate changes. No lesions suspicious for metastatic disease are identified in the cervical spine. There is mild disc bulging at C5-6 and C6-7 without evidence of significant stenosis. Paraspinal soft tissues are unremarkable.  MRI THORACIC SPINE:  There is a severe pathologic compression fracture at T5 with focal kyphosis at this level. Tumor diffusely involves the T5 vertebral body and posterior elements and there is extensive circumferential epidural tumor in the spinal canal resulting in severe spinal stenosis and moderate cord  compression. No substantial spinal cord edema is identified. Tumor  also involves the right T4 inferior articular process and lamina and extends into the right greater than left neural foramina at T4-5 and T5-6 with associated foraminal stenosis. There is paravertebral soft tissue tumor at T5.  There is a 6 mm T2 hyperintense enhancing focus along the T6 superior endplate, and there is a faint T2 hyperintense and mildly enhancing focus in the inferior T12 vertebral body measuring 8 mm. No epidural tumor is identified elsewhere in the thoracic spine. Thoracic vertebral body heights are preserved elsewhere, and there is no significant thoracic spine degenerative change. Mild dependent atelectasis is present in the lungs.  MRI LUMBAR SPINE:  Vertebral alignment is normal. Vertebral body heights and intervertebral disc space heights are preserved. There is diffuse disc desiccation with mild multilevel type 2 degenerative endplate marrow changes.  A 1 cm lesion in the L5 vertebral body is favored to represent a hemangioma. There are 6 mm T2 hyperintense, mildly enhancing foci along the L1 and L2 inferior endplates. No destructive osseous lesion or epidural tumor is seen in the lumbar spine. Conus medullaris is normal in signal and terminates at L1-2. There is no significant disc or facet degeneration, and there is no lumbar stenosis.  IMPRESSION: 1. Large, destructive T5 lesion with severe pathologic compression fracture and extensive epidural tumor. Severe spinal stenosis with moderate cord compression. 2. Posterior element tumor involvement at T4. 3. Small, scattered foci of mild enhancement elsewhere in the thoracic and lumbar spine as above, indeterminate with considerations including degenerative changes and small metastases. 4. No evidence of acute intracranial abnormality or intracranial metastatic disease. 5. No evidence of metastatic disease in the cervical spine. Critical Value/emergent results were called by  telephone at the time of interpretation on 02/14/2015 at 5:55 pm to Dr. Deno Etienne , who verbally acknowledged these results.   Electronically Signed   By: Logan Bores M.D.   On: 02/14/2015 17:57   Mr Thoracic Spine W Wo Contrast  02/14/2015   CLINICAL DATA:  Bilateral leg weakness and numbness for 1 week. History of breast cancer status post mastectomy in 2014.  EXAM: MRI HEAD WITHOUT AND WITH CONTRAST; MRI CERVICAL SPINE WITHOUT AND WITH CONTRAST; MRI LUMBAR SPINE WITHOUT AND WITH CONTRAST; MRI THORACIC SPINE WITHOUT AND WITH CONTRAST  TECHNIQUE: Multiplanar, multiecho pulse sequences of the brain and surrounding structures were obtained according to standard protocol without and with intravenous contrast; Multiplanar and multiecho pulse sequences of the cervical spine, to include the craniocervical junction and cervicothoracic junction, were obtained according to standard protocol without and with intravenous contrast.; Multiplanar and multiecho pulse sequences of the lumbar spine were obtained without and with intravenous contrast.; Multiplanar and multiecho pulse sequences of the thoracic spine were obtained without and with intravenous contrast.  CONTRAST:  3m MULTIHANCE GADOBENATE DIMEGLUMINE 529 MG/ML IV SOLN  COMPARISON:  Head CT 02/14/2015. CT abdomen and pelvis 02/06/2015. Brain MRI and PET-CT 03/22/2012.  FINDINGS: MRI HEAD:  There is no evidence of acute infarct, intracranial hemorrhage, mass, midline shift, or extra-axial fluid collection. Ventricles and sulci are normal. Punctate foci of T2 hyperintensity in the subcortical cerebral white matter bilaterally are similar to the prior MRI and nonspecific but may reflect minimal chronic small vessel ischemic disease. No abnormal enhancement is identified.  Orbits are unremarkable. Paranasal sinuses and mastoid air cells are clear. Major intracranial vascular flow voids are preserved. No suspicious skull lesions are identified.  MRI CERVICAL SPINE:   Vertebral alignment is normal. Vertebral body heights are preserved. There is  mild-to-moderate disc space narrowing at C5-6 with associated degenerative endplate changes. No lesions suspicious for metastatic disease are identified in the cervical spine. There is mild disc bulging at C5-6 and C6-7 without evidence of significant stenosis. Paraspinal soft tissues are unremarkable.  MRI THORACIC SPINE:  There is a severe pathologic compression fracture at T5 with focal kyphosis at this level. Tumor diffusely involves the T5 vertebral body and posterior elements and there is extensive circumferential epidural tumor in the spinal canal resulting in severe spinal stenosis and moderate cord compression. No substantial spinal cord edema is identified. Tumor also involves the right T4 inferior articular process and lamina and extends into the right greater than left neural foramina at T4-5 and T5-6 with associated foraminal stenosis. There is paravertebral soft tissue tumor at T5.  There is a 6 mm T2 hyperintense enhancing focus along the T6 superior endplate, and there is a faint T2 hyperintense and mildly enhancing focus in the inferior T12 vertebral body measuring 8 mm. No epidural tumor is identified elsewhere in the thoracic spine. Thoracic vertebral body heights are preserved elsewhere, and there is no significant thoracic spine degenerative change. Mild dependent atelectasis is present in the lungs.  MRI LUMBAR SPINE:  Vertebral alignment is normal. Vertebral body heights and intervertebral disc space heights are preserved. There is diffuse disc desiccation with mild multilevel type 2 degenerative endplate marrow changes.  A 1 cm lesion in the L5 vertebral body is favored to represent a hemangioma. There are 6 mm T2 hyperintense, mildly enhancing foci along the L1 and L2 inferior endplates. No destructive osseous lesion or epidural tumor is seen in the lumbar spine. Conus medullaris is normal in signal and terminates  at L1-2. There is no significant disc or facet degeneration, and there is no lumbar stenosis.  IMPRESSION: 1. Large, destructive T5 lesion with severe pathologic compression fracture and extensive epidural tumor. Severe spinal stenosis with moderate cord compression. 2. Posterior element tumor involvement at T4. 3. Small, scattered foci of mild enhancement elsewhere in the thoracic and lumbar spine as above, indeterminate with considerations including degenerative changes and small metastases. 4. No evidence of acute intracranial abnormality or intracranial metastatic disease. 5. No evidence of metastatic disease in the cervical spine. Critical Value/emergent results were called by telephone at the time of interpretation on 02/14/2015 at 5:55 pm to Dr. Deno Etienne , who verbally acknowledged these results.   Electronically Signed   By: Logan Bores M.D.   On: 02/14/2015 17:57   Mr Lumbar Spine W Wo Contrast  02/14/2015   CLINICAL DATA:  Bilateral leg weakness and numbness for 1 week. History of breast cancer status post mastectomy in 2014.  EXAM: MRI HEAD WITHOUT AND WITH CONTRAST; MRI CERVICAL SPINE WITHOUT AND WITH CONTRAST; MRI LUMBAR SPINE WITHOUT AND WITH CONTRAST; MRI THORACIC SPINE WITHOUT AND WITH CONTRAST  TECHNIQUE: Multiplanar, multiecho pulse sequences of the brain and surrounding structures were obtained according to standard protocol without and with intravenous contrast; Multiplanar and multiecho pulse sequences of the cervical spine, to include the craniocervical junction and cervicothoracic junction, were obtained according to standard protocol without and with intravenous contrast.; Multiplanar and multiecho pulse sequences of the lumbar spine were obtained without and with intravenous contrast.; Multiplanar and multiecho pulse sequences of the thoracic spine were obtained without and with intravenous contrast.  CONTRAST:  69m MULTIHANCE GADOBENATE DIMEGLUMINE 529 MG/ML IV SOLN  COMPARISON:  Head  CT 02/14/2015. CT abdomen and pelvis 02/06/2015. Brain MRI and PET-CT 03/22/2012.  FINDINGS: MRI HEAD:  There is no evidence of acute infarct, intracranial hemorrhage, mass, midline shift, or extra-axial fluid collection. Ventricles and sulci are normal. Punctate foci of T2 hyperintensity in the subcortical cerebral white matter bilaterally are similar to the prior MRI and nonspecific but may reflect minimal chronic small vessel ischemic disease. No abnormal enhancement is identified.  Orbits are unremarkable. Paranasal sinuses and mastoid air cells are clear. Major intracranial vascular flow voids are preserved. No suspicious skull lesions are identified.  MRI CERVICAL SPINE:  Vertebral alignment is normal. Vertebral body heights are preserved. There is mild-to-moderate disc space narrowing at C5-6 with associated degenerative endplate changes. No lesions suspicious for metastatic disease are identified in the cervical spine. There is mild disc bulging at C5-6 and C6-7 without evidence of significant stenosis. Paraspinal soft tissues are unremarkable.  MRI THORACIC SPINE:  There is a severe pathologic compression fracture at T5 with focal kyphosis at this level. Tumor diffusely involves the T5 vertebral body and posterior elements and there is extensive circumferential epidural tumor in the spinal canal resulting in severe spinal stenosis and moderate cord compression. No substantial spinal cord edema is identified. Tumor also involves the right T4 inferior articular process and lamina and extends into the right greater than left neural foramina at T4-5 and T5-6 with associated foraminal stenosis. There is paravertebral soft tissue tumor at T5.  There is a 6 mm T2 hyperintense enhancing focus along the T6 superior endplate, and there is a faint T2 hyperintense and mildly enhancing focus in the inferior T12 vertebral body measuring 8 mm. No epidural tumor is identified elsewhere in the thoracic spine. Thoracic  vertebral body heights are preserved elsewhere, and there is no significant thoracic spine degenerative change. Mild dependent atelectasis is present in the lungs.  MRI LUMBAR SPINE:  Vertebral alignment is normal. Vertebral body heights and intervertebral disc space heights are preserved. There is diffuse disc desiccation with mild multilevel type 2 degenerative endplate marrow changes.  A 1 cm lesion in the L5 vertebral body is favored to represent a hemangioma. There are 6 mm T2 hyperintense, mildly enhancing foci along the L1 and L2 inferior endplates. No destructive osseous lesion or epidural tumor is seen in the lumbar spine. Conus medullaris is normal in signal and terminates at L1-2. There is no significant disc or facet degeneration, and there is no lumbar stenosis.  IMPRESSION: 1. Large, destructive T5 lesion with severe pathologic compression fracture and extensive epidural tumor. Severe spinal stenosis with moderate cord compression. 2. Posterior element tumor involvement at T4. 3. Small, scattered foci of mild enhancement elsewhere in the thoracic and lumbar spine as above, indeterminate with considerations including degenerative changes and small metastases. 4. No evidence of acute intracranial abnormality or intracranial metastatic disease. 5. No evidence of metastatic disease in the cervical spine. Critical Value/emergent results were called by telephone at the time of interpretation on 02/14/2015 at 5:55 pm to Dr. Deno Etienne , who verbally acknowledged these results.   Electronically Signed   By: Logan Bores M.D.   On: 02/14/2015 17:57   Ct Abdomen Pelvis W Contrast  02/06/2015   CLINICAL DATA:  Epigastric pain.  History breast cancer 2014  EXAM: CT ABDOMEN AND PELVIS WITH CONTRAST  TECHNIQUE: Multidetector CT imaging of the abdomen and pelvis was performed using the standard protocol following bolus administration of intravenous contrast.  CONTRAST:  157m ISOVUE-300 IOPAMIDOL (ISOVUE-300)  INJECTION 61%  COMPARISON:  None.  FINDINGS: Lower chest: Right mastectomy. Bibasilar atelectasis.  Heart size within normal limits.  Hepatobiliary: Normal liver.  Normal gallbladder and bile ducts.  Pancreas: Atrophic pancreas.  Negative for mass or edema.  Spleen: Negative  Adrenals/Urinary Tract: Negative  Stomach/Bowel: Negative for bowel obstruction. No bowel edema. Appendix not visualized. Negative for diverticulitis. Negative for ventral hernia.  Vascular/Lymphatic: Normal aorta and iliac vessels.  Normal IVC.  Reproductive: Retroverted uterus without mass or enlargement. No adnexal mass.  Other: Negative for free fluid.  No adenopathy.  Musculoskeletal: No bony lesion or fracture.  IMPRESSION: No acute abnormality no cause for pain identified.   Electronically Signed   By: Franchot Gallo M.D.   On: 02/06/2015 16:10    Virgel Manifold, MD 02/14/15 [phone]

## 2015-02-14 NOTE — ED Notes (Signed)
Post- void residual: 72 ml

## 2015-02-14 NOTE — ED Notes (Signed)
Patient remains in MRI 

## 2015-02-14 NOTE — ED Notes (Signed)
MRI on the way to take patient.

## 2015-02-14 NOTE — ED Provider Notes (Signed)
CSN: [id number]     Arrival date & time 02/14/15  1207 History   First MD Initiated Contact with Patient 02/14/15 1213     Chief Complaint  Patient presents with  . Extremity Weakness    Weakness in BLE, numbness in abdomen and back x 1 week.      (Consider location/radiation/quality/duration/timing/severity/associated sxs/prior Treatment) Patient is a 69 y.o. female presenting with general illness. The history is provided by the patient.  Illness Severity:  Moderate Onset quality:  Gradual Duration:  2 weeks Timing:  Constant Progression:  Worsening Chronicity:  New Associated symptoms: chest pain (left lateral, sharp)   Associated symptoms: no congestion, no fever, no headaches, no myalgias, no nausea, no rhinorrhea, no shortness of breath, no vomiting and no wheezing    69 yo F with a chief complaint of bilateral lower extremity leg weakness. This been going on for at least a week. Patient states that she is slowly losing control over both lower extremities. Feel like she was unable to walk today. Patient states that really all the symptoms started in July when she had this sharp left-sided chest pain after having a right mastectomy. Patient was eventually diagnosed with costochondritis and started on Ultram and Robaxin. Patient state with his medicines she was so sedated that she was unable to really move around at home. Patient denies any back pain denies fevers or chills. Patient has had difficulty urinating as well as difficulty moving her bowels. Patient states that her perirectal sensation is remained intact. Patient denies recent injury denies recent illness.  Past Medical History  Diagnosis Date  . Chronic kidney disease   . Hx: UTI (urinary tract infection)   . Right shoulder pain   . Right knee pain   . Hypertension     recently started Aldactone   . Hyperlipidemia     but not on meds;diet and exercise controlled  . Dizziness     has been going on for 12month;medical  MD aware  . H/O hiatal hernia   . GERD (gastroesophageal reflux disease)     Tums prn  . Hemorrhoids   . Urinary frequency     d/t taking Aldactone  . History of UTI   . Anemia     hx of  . Cataract     immature-not sure of which eye  . Anxiety     r/t updated surgery  . Insomnia     takes Melatoniin daily  . Complication of anesthesia     pt states she is sensitive to meds  . [place]     right  . Skin cancer   . Hx of radiation therapy 10/25/12- 12/11/12    right chest wall/regional lymph nodes 5040 cGy, 28 sessions, right chest wall boost 1000 cGy 1 session  . H. pylori infection    Past Surgical History  Procedure Laterality Date  . Cyst removed from left breast  1970  . Left wrist surgery   2004    with plate  . Colonoscopy    . Esophagogastroduodenoscopy    . Mastectomy, radical    . Mastectomy modified radical  05/10/2012    Procedure: MASTECTOMY MODIFIED RADICAL;  Surgeon: PMerrie Roof MD;  [facility]  Service: General;  Laterality: Right;  RIGHT MODIFIED RADICAL MASTECTOMY  . Portacath placement  05/10/2012    Procedure: INSERTION PORT-A-CATH;  Surgeon: PMerrie Roof MD;  [place] [facility]  Service: General;  Laterality: Left;  Family History  Problem Relation Age of Onset  . Leukemia Mother   . Colon cancer Brother   . Cancer Brother     Prostate  . Colon cancer Maternal Grandmother    Social History  Substance Use Topics  . Smoking status: Never Smoker   . Smokeless tobacco: Never Used  . Alcohol Use: No   OB History    No data available     Review of Systems  Constitutional: Negative for fever and chills.  HENT: Negative for congestion and rhinorrhea.   Eyes: Negative for redness and visual disturbance.  Respiratory: Negative for shortness of breath and wheezing.   Cardiovascular: Positive for chest pain (left lateral, sharp). Negative for palpitations.  Gastrointestinal: Negative for nausea and vomiting.  Genitourinary:  Negative for dysuria and urgency.  Musculoskeletal: Negative for myalgias and arthralgias.  Skin: Negative for pallor and wound.  Neurological: Positive for weakness (bilateral leg) and numbness (decreased sensation from the chest down). Negative for dizziness and headaches.      Allergies  Codeine; Keflex; Naprosyn; and Tussin  Home Medications   Prior to Admission medications   Medication Sig Start Date End Date Taking? Authorizing Provider  B Complex-C (SUPER B COMPLEX PO) Take 1 each by mouth daily.   Yes Historical Provider, MD  letrozole (FEMARA) 2.5 MG tablet Take 1 tablet (2.5 mg total) by mouth daily. 10/27/14  Yes [name], MD  methocarbamol (ROBAXIN) 500 MG tablet Take 500 mg by mouth daily as needed for muscle spasms.   Yes Historical Provider, MD  polyethylene glycol powder (GLYCOLAX/MIRALAX) powder Take 1 Container by mouth daily as needed for mild constipation.   Yes Historical Provider, MD  Probiotic Product (PROBIOTIC DAILY PO) Take 1 capsule by mouth daily.    Yes Historical Provider, MD  ranitidine (ZANTAC) 300 MG tablet Take 300 mg by mouth daily.   Yes Historical Provider, MD  spironolactone (ALDACTONE) 25 MG tablet Take 0.5 tablets (12.5 mg total) by mouth daily. 04/15/13  Yes Minette Headland, NP  traMADol (ULTRAM) 50 MG tablet Take 50 mg by mouth every 6 (six) hours as needed for moderate pain.   Yes Historical Provider, MD  UNABLE TO FIND Rx: L8000- Post Surgical Bra (Quantity: 6) E3154- Silicone Breast Prosthesis (Quantity: 1) Dx: 174.9; Right mastectomy 12/10/13  Yes Autumn Messing III, MD  furosemide (LASIX) 20 MG tablet Take 1 tablet (20 mg total) by mouth daily as needed. Patient not taking: Reported on 01/11/2015 11/16/12   Jolaine Artist, MD  pravastatin (PRAVACHOL) 10 MG tablet Take 10 mg by mouth daily.  03/14/14   Historical Provider, MD   BP 138/71 mmHg  Pulse 82  Resp 18  Ht '5\' 4"'$  (1.626 m)  Wt 130 lb (58.968 kg)  BMI 22.30 kg/m2  SpO2  100% Physical Exam  Constitutional: She is oriented to person, place, and time. She appears well-developed and well-nourished. No distress.  HENT:  Head: Normocephalic and atraumatic.  Eyes: EOM are normal. Pupils are equal, round, and reactive to light.  Neck: Normal range of motion. Neck supple.  Cardiovascular: Normal rate and regular rhythm.  Exam reveals no gallop and no friction rub.   No murmur heard. Pulmonary/Chest: Effort normal. She has no wheezes. She has no rales.  Abdominal: Soft. She exhibits no distension. There is no tenderness. There is no rebound and no guarding.  Musculoskeletal: She exhibits no edema or tenderness.  Neurological: She is alert and oriented to person, place, and time.  She displays no Babinski's sign on the right side. She displays no Babinski's sign on the left side.  Reflex Scores:      Tricep reflexes are 2+ on the right side and 2+ on the left side.      Bicep reflexes are 2+ on the right side and 2+ on the left side.      Brachioradialis reflexes are 2+ on the right side and 2+ on the left side.      Patellar reflexes are 2+ on the right side and 2+ on the left side.      Achilles reflexes are 2+ on the right side and 2+ on the left side. No noted clonus.   Skin: Skin is warm and dry. She is not diaphoretic.  Psychiatric: She has a normal mood and affect. Her behavior is normal.    ED Course  Procedures (including critical care time) Labs Review Labs Reviewed  URINALYSIS, [place] (NOT AT [facility]) - Abnormal; Notable for the following:    Hgb urine dipstick TRACE (*)    All other components within normal limits  COMPREHENSIVE METABOLIC PANEL - Abnormal; Notable for the following:    Sodium 134 (*)    Potassium 3.2 (*)    Chloride 96 (*)    Glucose, Bld 188 (*)    All other components within normal limits  ETHANOL  URINE MICROSCOPIC-ADD ON  CBC WITH DIFFERENTIAL/PLATELET  CBC WITH DIFFERENTIAL/PLATELET  URINE RAPID DRUG  SCREEN, HOSP PERFORMED    Imaging Review Ct Head Wo Contrast  02/14/2015   CLINICAL DATA:  Bilateral lower extremity weakness.  EXAM: CT HEAD WITHOUT CONTRAST  TECHNIQUE: Contiguous axial images were obtained from the base of the skull through the vertex without intravenous contrast.  COMPARISON:  MRI scan of March 22, 2012.  FINDINGS: Bony calvarium appears intact. No mass effect or midline shift is noted. Ventricular size is within normal limits. There is no evidence of mass lesion, hemorrhage or acute infarction.  IMPRESSION: Normal head CT.   Electronically Signed   By: Marijo Conception, M.D.   On: 02/14/2015 14:24   I have personally reviewed and evaluated these images and lab results as part of my medical decision-making.   EKG Interpretation   Date/Time:  Saturday February 14 2015 12:53:08 EDT Ventricular Rate:  98 PR Interval:  175 QRS Duration: 87 QT Interval:  346 QTC Calculation: 442 R Axis:   40 Text Interpretation:  Sinus rhythm Probable left atrial enlargement  Borderline T abnormalities, anterior leads No significant change since  last tracing Confirmed by Marikay Roads MD, Quillian Quince ([id number]) on 02/14/2015 2:48:43 PM      MDM   Final diagnoses:  Unable to ambulate  Leg weakness    69 yo F with a chief complaint of bilateral lower extreme weakness. 4-5 muscle strength bilateral lower extremities. No noted clonus and hyperreflexia negative Babinski. Mildly diminished rectal tone.  We'll obtain an MR of the spine. CT head. CBC CMP urine UDS EtOH.  Discussed case with Dr. Aram Beecham, recommend MR brain while in MRI.  Turned over care to Dr. Wilson Singer, please see their note for further info.     Deno Etienne, DO 02/14/15 [phone]

## 2015-02-15 ENCOUNTER — Inpatient Hospital Stay (HOSPITAL_COMMUNITY): Payer: Medicare Other | Admitting: Anesthesiology

## 2015-02-15 ENCOUNTER — Inpatient Hospital Stay (HOSPITAL_COMMUNITY): Payer: Medicare Other

## 2015-02-15 ENCOUNTER — Encounter (HOSPITAL_COMMUNITY)
Admission: EM | Disposition: A | Discharge status: Rehabilitation Facility | Payer: Self-pay | Source: Home / Self Care | Attending: Internal Medicine

## 2015-02-15 DIAGNOSIS — C50919 Malignant neoplasm of unspecified site of unspecified female breast: Secondary | ICD-10-CM

## 2015-02-15 DIAGNOSIS — C7951 Secondary malignant neoplasm of bone: Secondary | ICD-10-CM | POA: Diagnosis present

## 2015-02-15 DIAGNOSIS — G9619 Other disorders of meninges, not elsewhere classified: Secondary | ICD-10-CM

## 2015-02-15 DIAGNOSIS — D492 Neoplasm of unspecified behavior of bone, soft tissue, and skin: Secondary | ICD-10-CM

## 2015-02-15 DIAGNOSIS — G96198 Other disorders of meninges, not elsewhere classified: Secondary | ICD-10-CM | POA: Diagnosis present

## 2015-02-15 DIAGNOSIS — G9529 Other cord compression: Secondary | ICD-10-CM | POA: Diagnosis present

## 2015-02-15 HISTORY — PX: DECOMPRESSIVE LUMBAR LAMINECTOMY LEVEL 4: SHX5794

## 2015-02-15 LAB — CBC WITH DIFFERENTIAL/PLATELET
Basophils Absolute: 0 10*3/uL (ref 0.0–0.1)
Basophils Relative: 1 %
Eosinophils Absolute: 0.1 10*3/uL (ref 0.0–0.7)
Eosinophils Relative: 2 %
HCT: 41.2 % (ref 36.0–46.0)
Hemoglobin: 13.7 g/dL (ref 12.0–15.0)
Lymphocytes Relative: 27 %
Lymphs Abs: 1.8 10*3/uL (ref 0.7–4.0)
MCH: 28.8 pg (ref 26.0–34.0)
MCHC: 33.3 g/dL (ref 30.0–36.0)
MCV: 86.6 fL (ref 78.0–100.0)
Monocytes Absolute: 0.7 10*3/uL (ref 0.1–1.0)
Monocytes Relative: 10 %
Neutro Abs: 3.9 10*3/uL (ref 1.7–7.7)
Neutrophils Relative %: 60 %
Platelets: 207 10*3/uL (ref 150–400)
RBC: 4.76 MIL/uL (ref 3.87–5.11)
RDW: 12.8 % (ref 11.5–15.5)
WBC: 6.5 10*3/uL (ref 4.0–10.5)

## 2015-02-15 LAB — COMPREHENSIVE METABOLIC PANEL
ALT: 19 U/L (ref 14–54)
AST: 31 U/L (ref 15–41)
Albumin: 3.5 g/dL (ref 3.5–5.0)
Alkaline Phosphatase: 86 U/L (ref 38–126)
Anion gap: 13 (ref 5–15)
BUN: 10 mg/dL (ref 6–20)
CO2: 27 mmol/L (ref 22–32)
Calcium: 9.8 mg/dL (ref 8.9–10.3)
Chloride: 97 mmol/L — ABNORMAL LOW (ref 101–111)
Creatinine, Ser: 0.94 mg/dL (ref 0.44–1.00)
GFR calc Af Amer: >60 mL/min (ref >60)
GFR calc non Af Amer: >60 mL/min (ref >60)
Glucose, Bld: 90 mg/dL (ref 65–99)
Potassium: 3.8 mmol/L (ref 3.5–5.1)
Sodium: 137 mmol/L (ref 135–145)
Total Bilirubin: 0.6 mg/dL (ref 0.3–1.2)
Total Protein: 5.8 g/dL — ABNORMAL LOW (ref 6.5–8.1)

## 2015-02-15 LAB — MAGNESIUM: Magnesium: 1.9 mg/dL (ref 1.7–2.4)

## 2015-02-15 LAB — CREATININE, SERUM
Creatinine, Ser: 1.09 mg/dL — ABNORMAL HIGH (ref 0.44–1.00)
GFR calc Af Amer: 59 mL/min — ABNORMAL LOW (ref >60)
GFR calc non Af Amer: 51 mL/min — ABNORMAL LOW (ref >60)

## 2015-02-15 LAB — PHOSPHORUS: Phosphorus: 3.7 mg/dL (ref 2.5–4.6)

## 2015-02-15 SURGERY — DECOMPRESSIVE LUMBAR LAMINECTOMY LEVEL 4
Anesthesia: General | Site: Spine Thoracic

## 2015-02-15 MED ORDER — SODIUM CHLORIDE 0.9 % IR SOLN
Status: DC | PRN
  Administered 2015-02-15: 500 mL

## 2015-02-15 MED ORDER — LACTATED RINGERS IV SOLN
INTRAVENOUS | Status: DC | PRN
Start: 2015-02-15 — End: 2015-02-16
  Administered 2015-02-15 (×2): via INTRAVENOUS

## 2015-02-15 MED ORDER — SUCCINYLCHOLINE CHLORIDE 20 MG/ML IJ SOLN
INTRAMUSCULAR | Status: AC
  Filled 2015-02-15: qty 1

## 2015-02-15 MED ORDER — PHENYLEPHRINE 40 MCG/ML (10ML) SYRINGE FOR IV PUSH (FOR BLOOD PRESSURE SUPPORT)
PREFILLED_SYRINGE | INTRAVENOUS | Status: AC
  Filled 2015-02-15: qty 10

## 2015-02-15 MED ORDER — LIDOCAINE HCL (CARDIAC) 20 MG/ML IV SOLN
INTRAVENOUS | Status: AC
  Filled 2015-02-15: qty 5

## 2015-02-15 MED ORDER — PROPOFOL 10 MG/ML IV BOLUS
INTRAVENOUS | Status: AC
  Filled 2015-02-15: qty 20

## 2015-02-15 MED ORDER — FENTANYL CITRATE (PF) 100 MCG/2ML IJ SOLN
INTRAMUSCULAR | Status: DC | PRN
  Administered 2015-02-15: 150 ug via INTRAVENOUS
  Administered 2015-02-15 (×2): 100 ug via INTRAVENOUS
  Administered 2015-02-16: 50 ug via INTRAVENOUS

## 2015-02-15 MED ORDER — CEFAZOLIN SODIUM-DEXTROSE 2-3 GM-% IV SOLR
INTRAVENOUS | Status: AC
  Administered 2015-02-15: 2 g via INTRAVENOUS
  Filled 2015-02-15: qty 50

## 2015-02-15 MED ORDER — THROMBIN 5000 UNITS EX SOLR
OROMUCOSAL | Status: DC | PRN
  Administered 2015-02-15: 5 mL via TOPICAL

## 2015-02-15 MED ORDER — STERILE WATER FOR INJECTION IJ SOLN
INTRAMUSCULAR | Status: AC
  Filled 2015-02-15: qty 10

## 2015-02-15 MED ORDER — FENTANYL CITRATE (PF) 250 MCG/5ML IJ SOLN
INTRAMUSCULAR | Status: AC
  Filled 2015-02-15: qty 5

## 2015-02-15 MED ORDER — EPHEDRINE SULFATE 50 MG/ML IJ SOLN
INTRAMUSCULAR | Status: AC
  Filled 2015-02-15: qty 1

## 2015-02-15 MED ORDER — PHENYLEPHRINE HCL 10 MG/ML IJ SOLN
INTRAMUSCULAR | Status: DC | PRN
  Administered 2015-02-15: 80 ug via INTRAVENOUS
  Administered 2015-02-15: 40 ug via INTRAVENOUS
  Administered 2015-02-15: 80 ug via INTRAVENOUS
  Administered 2015-02-15: 40 ug via INTRAVENOUS
  Administered 2015-02-15 (×3): 80 ug via INTRAVENOUS
  Administered 2015-02-15: 40 ug via INTRAVENOUS
  Administered 2015-02-16: 120 ug via INTRAVENOUS
  Administered 2015-02-16 (×3): 80 ug via INTRAVENOUS
  Administered 2015-02-16: 160 ug via INTRAVENOUS
  Administered 2015-02-16: 120 ug via INTRAVENOUS
  Administered 2015-02-16: 40 ug via INTRAVENOUS

## 2015-02-15 MED ORDER — ONDANSETRON HCL 4 MG/2ML IJ SOLN
INTRAMUSCULAR | Status: AC
  Filled 2015-02-15: qty 2

## 2015-02-15 MED ORDER — LIDOCAINE-EPINEPHRINE 1 %-1:100000 IJ SOLN
INTRAMUSCULAR | Status: DC | PRN
  Administered 2015-02-15: 3 mL via INTRADERMAL

## 2015-02-15 MED ORDER — PROPOFOL 10 MG/ML IV BOLUS
INTRAVENOUS | Status: DC | PRN
  Administered 2015-02-15: 160 mg via INTRAVENOUS

## 2015-02-15 MED ORDER — THROMBIN 20000 UNITS EX SOLR
CUTANEOUS | Status: DC | PRN
  Administered 2015-02-15: 20 mL via TOPICAL

## 2015-02-15 MED ORDER — VECURONIUM BROMIDE 10 MG IV SOLR
INTRAVENOUS | Status: AC
  Filled 2015-02-15: qty 10

## 2015-02-15 MED ORDER — DEXAMETHASONE SODIUM PHOSPHATE 4 MG/ML IJ SOLN
4.0000 mg | Freq: Four times a day (QID) | INTRAMUSCULAR | Status: DC
  Administered 2015-02-15 – 2015-02-18 (×11): 4 mg via INTRAVENOUS
  Filled 2015-02-15 (×10): qty 1

## 2015-02-15 MED ORDER — ROCURONIUM BROMIDE 100 MG/10ML IV SOLN
INTRAVENOUS | Status: DC | PRN
  Administered 2015-02-15: 50 mg via INTRAVENOUS

## 2015-02-15 MED ORDER — NEOSTIGMINE METHYLSULFATE 10 MG/10ML IV SOLN
INTRAVENOUS | Status: AC
  Filled 2015-02-15: qty 4

## 2015-02-15 MED ORDER — LIDOCAINE HCL (CARDIAC) 20 MG/ML IV SOLN
INTRAVENOUS | Status: DC | PRN
  Administered 2015-02-15: 80 mg via INTRAVENOUS

## 2015-02-15 MED ORDER — MIDAZOLAM HCL 5 MG/5ML IJ SOLN
INTRAMUSCULAR | Status: DC | PRN
  Administered 2015-02-15: 2 mg via INTRAVENOUS

## 2015-02-15 MED ORDER — ARTIFICIAL TEARS OP OINT
TOPICAL_OINTMENT | OPHTHALMIC | Status: DC | PRN
  Administered 2015-02-15: 1 via OPHTHALMIC

## 2015-02-15 MED ORDER — ROCURONIUM BROMIDE 50 MG/5ML IV SOLN
INTRAVENOUS | Status: AC
  Filled 2015-02-15: qty 1

## 2015-02-15 MED ORDER — SODIUM CHLORIDE 0.9 % IJ SOLN
INTRAMUSCULAR | Status: AC
  Filled 2015-02-15: qty 10

## 2015-02-15 MED ORDER — DEXAMETHASONE SODIUM PHOSPHATE 4 MG/ML IJ SOLN
INTRAMUSCULAR | Status: AC
  Filled 2015-02-15: qty 1

## 2015-02-15 MED ORDER — CEFAZOLIN SODIUM-DEXTROSE 2-3 GM-% IV SOLR
INTRAVENOUS | Status: AC
  Filled 2015-02-15: qty 50

## 2015-02-15 MED ORDER — 0.9 % SODIUM CHLORIDE (POUR BTL) OPTIME
TOPICAL | Status: DC | PRN
  Administered 2015-02-15: 1000 mL

## 2015-02-15 MED ORDER — MIDAZOLAM HCL 2 MG/2ML IJ SOLN
INTRAMUSCULAR | Status: AC
  Filled 2015-02-15: qty 4

## 2015-02-15 MED ORDER — VECURONIUM BROMIDE 10 MG IV SOLR
INTRAVENOUS | Status: DC | PRN
  Administered 2015-02-15: 2 mg via INTRAVENOUS
  Administered 2015-02-15: 1 mg via INTRAVENOUS

## 2015-02-15 MED ORDER — GLYCOPYRROLATE 0.2 MG/ML IJ SOLN
INTRAMUSCULAR | Status: AC
  Filled 2015-02-15: qty 3

## 2015-02-15 SURGICAL SUPPLY — 39 items
2.0MM NEURO MATCH ×2 IMPLANT
BUR PRECISION FLUTE 5.0 (BURR) ×2 IMPLANT
CLOTH BEACON ORANGE TIMEOUT ST (SAFETY) ×2 IMPLANT
CONT SPEC STER OR (MISCELLANEOUS) ×2 IMPLANT
DERMABOND ADVANCED (GAUZE/BANDAGES/DRESSINGS) ×1
DERMABOND ADVANCED .7 DNX12 (GAUZE/BANDAGES/DRESSINGS) ×1 IMPLANT
DRAPE C-ARM 42X72 X-RAY (DRAPES) ×2 IMPLANT
DRAPE C-ARMOR (DRAPES) ×2 IMPLANT
DRSG OPSITE POSTOP 4X8 (GAUZE/BANDAGES/DRESSINGS) ×2 IMPLANT
DURAMATRIX ONLAY 2X2 (Neuro Prosthesis/Implant) ×2 IMPLANT
DURASEAL APPLICATOR TIP (TIP) ×2 IMPLANT
DURASEAL SPINE SEALANT 3ML (MISCELLANEOUS) ×2 IMPLANT
GAUZE SPONGE 4X4 16PLY XRAY LF (GAUZE/BANDAGES/DRESSINGS) ×2 IMPLANT
GLOVE BIO SURGEON STRL SZ 6.5 (GLOVE) ×2 IMPLANT
GLOVE BIO SURGEON STRL SZ7 (GLOVE) ×2 IMPLANT
GLOVE ECLIPSE 6.5 STRL STRAW (GLOVE) ×2 IMPLANT
GLOVE ECLIPSE 7.0 STRL STRAW (GLOVE) ×2 IMPLANT
GLOVE INDICATOR 7.5 STRL GRN (GLOVE) ×4 IMPLANT
GOWN STRL REUS W/ TWL LRG LVL3 (GOWN DISPOSABLE) ×3 IMPLANT
GOWN STRL REUS W/TWL LRG LVL3 (GOWN DISPOSABLE) ×3
GUIDEWIRE NITINOL BEVEL TIP (WIRE) ×12 IMPLANT
KIT BASIN OR (CUSTOM PROCEDURE TRAY) ×2 IMPLANT
KIT ROOM TURNOVER OR (KITS) ×2 IMPLANT
NEEDLE HYPO 25X1 1.5 SAFETY (NEEDLE) ×2 IMPLANT
NEEDLE I PASS (NEEDLE) ×4 IMPLANT
PACK LAMINECTOMY NEURO (CUSTOM PROCEDURE TRAY) ×2 IMPLANT
ROD RELINE MAS LORD 5.5X120MM (Rod) ×4 IMPLANT
SCREW LOCK RELINE 5.5 TULIP (Screw) ×16 IMPLANT
SCREW RELINE 4.5X35 POLYAXIAL (Screw) ×4 IMPLANT
SCREW RELINE MAS POLY 4.5X30MM (Screw) ×8 IMPLANT
SCREW RELINE MAS POLY 4.5X35 (Screw) ×4 IMPLANT
SPONGE LAP 4X18 X RAY DECT (DISPOSABLE) ×2 IMPLANT
SUT VIC AB 0 CT1 18XCR BRD8 (SUTURE) ×4 IMPLANT
SUT VIC AB 0 CT1 8-18 (SUTURE) ×4
SUT VIC AB 2-0 CT1 18 (SUTURE) IMPLANT
SUT VICRYL 3-0 RB1 18 ABS (SUTURE) ×6 IMPLANT
TOWEL OR 17X26 10 PK STRL BLUE (TOWEL DISPOSABLE) ×2 IMPLANT
TOWEL OR 17X26 4PK STRL BLUE (TOWEL DISPOSABLE) ×2 IMPLANT
TRAY FOLEY CATH 16FRSI W/METER (SET/KITS/TRAYS/PACK) ×2 IMPLANT

## 2015-02-15 NOTE — Consult Note (Signed)
CC:  Chief Complaint  Patient presents with  . Extremity Weakness    Weakness in BLE, numbness in abdomen and back x 1 week.     HPI: Heather [name] is a 69 y.o. female with a history of right breast CA treated with mastectomy, lymph node dissection, radiation, and chemo in 2014. She was on Herceptin, and then on letrozole. She initially began to notice numbness around the chest level and down with pain wrapping around the chest back in July. Over the past 1 week, starting on Mon, she began to have weakness of both legs, feeling like they were wobbly. She was still able to walk, but started to use a walker. On Fri, she said she couldn't really support weight anymore. She underwent MRI scans yesterday which demonstrated significant compression at T5 and was [facility]. Upon questioning, she is also c/o inability to easily empty her bladder which also started this weekend.  In addition, she has upper thoracic back pain which she describes as tolerable when she is still, but is significantly worsened whenever she tries to move.  PMH: Past Medical History  Diagnosis Date  . Chronic kidney disease   . Hx: UTI (urinary tract infection)   . Right shoulder pain   . Right knee pain   . Hypertension     recently started Aldactone   . Hyperlipidemia     but not on meds;diet and exercise controlled  . Dizziness     has been going on for 56month;medical MD aware  . H/O hiatal hernia   . GERD (gastroesophageal reflux disease)     Tums prn  . Hemorrhoids   . Urinary frequency     d/t taking Aldactone  . History of UTI   . Anemia     hx of  . Cataract     immature-not sure of which eye  . Anxiety     r/t updated surgery  . Insomnia     takes Melatoniin daily  . Complication of anesthesia     pt states she is sensitive to meds  . [place]     [place]  . Skin cancer   . Hx of radiation therapy 10/25/12- 12/11/12    right chest wall/regional lymph nodes 5040 cGy, 28 sessions,  right chest wall boost 1000 cGy 1 session  . H. pylori infection     PSH: Past Surgical History  Procedure Laterality Date  . Cyst removed from left breast  1970  . Left wrist surgery   2004    with plate  . Colonoscopy    . Esophagogastroduodenoscopy    . Mastectomy, radical    . Mastectomy modified radical  05/10/2012    Procedure: MASTECTOMY MODIFIED RADICAL;  Surgeon: PMerrie Roof MD;  [place] [facility]  Service: General;  Laterality: Right;  RIGHT MODIFIED RADICAL MASTECTOMY  . Portacath placement  05/10/2012    Procedure: INSERTION PORT-A-CATH;  Surgeon: PMerrie Roof MD;  [place]  Service: General;  Laterality: Left;    SH: Social History  Substance Use Topics  . Smoking status: Never Smoker   . Smokeless tobacco: Never Used  . Alcohol Use: No    MEDS: Prior to Admission medications   Medication Sig Start Date End Date Taking? Authorizing Provider  B Complex-C (SUPER B COMPLEX PO) Take 1 each by mouth daily.   Yes Historical Provider, MD  letrozole (FEMARA) 2.5 MG tablet Take 1 tablet (2.5 mg  total) by mouth daily. 10/27/14  Yes [name], MD  methocarbamol (ROBAXIN) 500 MG tablet Take 500 mg by mouth daily as needed for muscle spasms.   Yes Historical Provider, MD  polyethylene glycol powder (GLYCOLAX/MIRALAX) powder Take 1 Container by mouth daily as needed for mild constipation.   Yes Historical Provider, MD  Probiotic Product (PROBIOTIC DAILY PO) Take 1 capsule by mouth daily.    Yes Historical Provider, MD  ranitidine (ZANTAC) 300 MG tablet Take 300 mg by mouth daily.   Yes Historical Provider, MD  spironolactone (ALDACTONE) 25 MG tablet Take 0.5 tablets (12.5 mg total) by mouth daily. 04/15/13  Yes Minette Headland, NP  traMADol (ULTRAM) 50 MG tablet Take 50 mg by mouth every 6 (six) hours as needed for moderate pain.   Yes Historical Provider, MD  UNABLE TO FIND Rx: L8000- Post Surgical Bra (Quantity: 6) Q7622- Silicone Breast Prosthesis (Quantity:  1) Dx: 174.9; Right mastectomy 12/10/13  Yes Autumn Messing III, MD  furosemide (LASIX) 20 MG tablet Take 1 tablet (20 mg total) by mouth daily as needed. Patient not taking: Reported on 01/11/2015 11/16/12   Jolaine Artist, MD  pravastatin (PRAVACHOL) 10 MG tablet Take 10 mg by mouth daily.  03/14/14   Historical Provider, MD    ALLERGY: Allergies  Allergen Reactions  . Codeine     " loopy"  . Keflex [Cephalexin]     GI Upset  . Naprosyn [Naproxen] Other (See Comments)    GI ipset   . Tussin [Guaifenesin] Other (See Comments)    Dizzy     ROS: ROS  NEUROLOGIC EXAM: Awake, alert, oriented Memory and concentration grossly intact Speech fluent, appropriate CN grossly intact Motor exam: Upper Extremities Deltoid Bicep Tricep Grip  Right 5/5 5/5 5/5 5/5  Left 5/5 5/5 5/5 5/5   Lower Extremity IP Quad PF DF EHL  Right 4/5 4/5 4/5 4-/5 3/5  Left 4-/5 5/5 4+/5 3/5 3/5   Sensation decreased bilaterally from ~T4 level down   IMGAING: MRI Tspine demonstrates infiltration of the T5 vertebra involving both body and posterior elements causing significant canal compromise and spinal cord compression. There is kyphosis at this level due to pathologic fracture of the T5 body.  IMPRESSION: - 69 y.o. female with fracture/instability at T5 with spinal cord compression and myelopathy, inability to walk for ~24-48 hrs  PLAN: - Will plan on operative decompression/separation at T5 and stabilization from T3 - T7 - Will start decadron - NPO  I have reviewed the imaging findings with the patient. The need for surgical decompression was reviewed. Risks of the above surgery were discussed including spinal cord injury leading to worsening N/T/W or bladder dysfunction, CSF leak, bleeding, infection. All questions were answered, and she verbalized eagerness to proceed with surgery.

## 2015-02-15 NOTE — Progress Notes (Signed)
Utilization Review Completed.Donne Anon T10/01/2015

## 2015-02-15 NOTE — Progress Notes (Signed)
[facility] PROGRESS NOTE  Heather [name] WNU:[id number] DOB: January 07, 1946 DOA: 02/14/2015 PCP: Vidal Schwalbe, MD  Assessment/Plan: #1 spinal cord compression with myelopathy and inability to walk/epidural tumor Per MRI of the T-spine. CT of the T-spine has been ordered by neurosurgery. Patient for probable operative decompression/separation at T5 and stabilization from T3-T7 per neurosurgery. Patient to be started on Decadron. Per neurosurgery.  #2 pathologic fracture of the T-spine Secondary to problem #1. Per neurosurgery.  #3 hypertension Stable. Continue home regimen of furosemide and Aldactone.  #4 history of breast cancer Status post right mastectomy being followed by oncology. Continue femora. Will inform oncology via apical patient's admission.  #5 hyperlipidemia Continue statin.  #6 hypokalemia Repleted.  #7 prophylaxis Pepcid for GI prophylaxis. Lovenox for DVT prophylaxis.  Code Status: Full Family Communication: Updated patient and family at bedside. Disposition Plan: Pending neurosurgical evaluation.   Consultants:  Neurosurgery: Dr. Kathyrn Sheriff 02/15/2015  Procedures:  CT head 02/14/2015  CT T-spine 02/15/2015  MRI head 02/14/2015, MRI C, L, T-spine 02/14/2015  Antibiotics:  None  HPI/Subjective: Patient states had some inability to easily empty her bladder. Patient with complaint of worsening weakness. No chest pain. No shortness of breath.  Objective: Filed Vitals:   02/15/15 0443  BP: 120/63  Pulse: 66  Temp: 97.6 F (36.4 C)  Resp: 16    Intake/Output Summary (Last 24 hours) at 02/15/15 1203 Last data filed at 02/15/15 1009  Gross per 24 hour  Intake    240 ml  Output    400 ml  Net   -160 ml   Filed Weights   02/14/15 1331  Weight: 58.968 kg (130 lb)    Exam:   General:  NAD  Cardiovascular: RRR  Respiratory: CTAB  Abdomen: Soft, nontender, nondistended, positive bowel sounds.  Musculoskeletal: No clubbing  cyanosis or edema.  Data Reviewed: Basic Metabolic Panel:  Recent Labs Lab 02/14/15 1309 02/14/15 2335 02/15/15 0325  NA 134*  --  137  K 3.2*  --  3.8  CL 96*  --  97*  CO2 28  --  27  GLUCOSE 188*  --  90  BUN 10  --  10  CREATININE 0.89 1.09* 0.94  CALCIUM 10.2  --  9.8  MG  --   --  1.9  PHOS  --   --  3.7   Liver Function Tests:  Recent Labs Lab 02/14/15 1309 02/15/15 0325  AST 32 31  ALT 18 19  ALKPHOS 92 86  BILITOT 0.7 0.6  PROT 7.9 5.8*  ALBUMIN 4.3 3.5   No results for input(s): LIPASE, AMYLASE in the last 168 hours. No results for input(s): AMMONIA in the last 168 hours. CBC:  Recent Labs Lab 02/14/15 1309 02/14/15 2335 02/15/15 0325  WBC 7.8 7.7 6.5  NEUTROABS 6.6  --  3.9  HGB 14.3 14.5 13.7  HCT 40.9 42.4 41.2  MCV 84.9 85.8 86.6  PLT 225 217 207   Cardiac Enzymes: No results for input(s): CKTOTAL, CKMB, CKMBINDEX, TROPONINI in the last 168 hours. BNP (last 3 results) No results for input(s): BNP in the last 8760 hours.  ProBNP (last 3 results) No results for input(s): PROBNP in the last 8760 hours.  CBG: No results for input(s): GLUCAP in the last 168 hours.  No results found for this or any previous visit (from the past 240 hour(s)).   Studies: Ct Head Wo Contrast  02/14/2015   CLINICAL DATA:  Bilateral lower extremity weakness.  EXAM: CT  HEAD WITHOUT CONTRAST  TECHNIQUE: Contiguous axial images were obtained from the base of the skull through the vertex without intravenous contrast.  COMPARISON:  MRI scan of March 22, 2012.  FINDINGS: Bony calvarium appears intact. No mass effect or midline shift is noted. Ventricular size is within normal limits. There is no evidence of mass lesion, hemorrhage or acute infarction.  IMPRESSION: Normal head CT.   Electronically Signed   By: Marijo Conception, M.D.   On: 02/14/2015 14:24   Mr Jeri Cos TD Contrast  02/14/2015   CLINICAL DATA:  Bilateral leg weakness and numbness for 1 week. History of  breast cancer status post mastectomy in 2014.  EXAM: MRI HEAD WITHOUT AND WITH CONTRAST; MRI CERVICAL SPINE WITHOUT AND WITH CONTRAST; MRI LUMBAR SPINE WITHOUT AND WITH CONTRAST; MRI THORACIC SPINE WITHOUT AND WITH CONTRAST  TECHNIQUE: Multiplanar, multiecho pulse sequences of the brain and surrounding structures were obtained according to standard protocol without and with intravenous contrast; Multiplanar and multiecho pulse sequences of the cervical spine, to include the craniocervical junction and cervicothoracic junction, were obtained according to standard protocol without and with intravenous contrast.; Multiplanar and multiecho pulse sequences of the lumbar spine were obtained without and with intravenous contrast.; Multiplanar and multiecho pulse sequences of the thoracic spine were obtained without and with intravenous contrast.  CONTRAST:  95m MULTIHANCE GADOBENATE DIMEGLUMINE 529 MG/ML IV SOLN  COMPARISON:  Head CT 02/14/2015. CT abdomen and pelvis 02/06/2015. Brain MRI and PET-CT 03/22/2012.  FINDINGS: MRI HEAD:  There is no evidence of acute infarct, intracranial hemorrhage, mass, midline shift, or extra-axial fluid collection. Ventricles and sulci are normal. Punctate foci of T2 hyperintensity in the subcortical cerebral white matter bilaterally are similar to the prior MRI and nonspecific but may reflect minimal chronic small vessel ischemic disease. No abnormal enhancement is identified.  Orbits are unremarkable. Paranasal sinuses and mastoid air cells are clear. Major intracranial vascular flow voids are preserved. No suspicious skull lesions are identified.  MRI CERVICAL SPINE:  Vertebral alignment is normal. Vertebral body heights are preserved. There is mild-to-moderate disc space narrowing at C5-6 with associated degenerative endplate changes. No lesions suspicious for metastatic disease are identified in the cervical spine. There is mild disc bulging at C5-6 and C6-7 without evidence of  significant stenosis. Paraspinal soft tissues are unremarkable.  MRI THORACIC SPINE:  There is a severe pathologic compression fracture at T5 with focal kyphosis at this level. Tumor diffusely involves the T5 vertebral body and posterior elements and there is extensive circumferential epidural tumor in the spinal canal resulting in severe spinal stenosis and moderate cord compression. No substantial spinal cord edema is identified. Tumor also involves the right T4 inferior articular process and lamina and extends into the right greater than left neural foramina at T4-5 and T5-6 with associated foraminal stenosis. There is paravertebral soft tissue tumor at T5.  There is a 6 mm T2 hyperintense enhancing focus along the T6 superior endplate, and there is a faint T2 hyperintense and mildly enhancing focus in the inferior T12 vertebral body measuring 8 mm. No epidural tumor is identified elsewhere in the thoracic spine. Thoracic vertebral body heights are preserved elsewhere, and there is no significant thoracic spine degenerative change. Mild dependent atelectasis is present in the lungs.  MRI LUMBAR SPINE:  Vertebral alignment is normal. Vertebral body heights and intervertebral disc space heights are preserved. There is diffuse disc desiccation with mild multilevel type 2 degenerative endplate marrow changes.  A 1 cm lesion  in the L5 vertebral body is favored to represent a hemangioma. There are 6 mm T2 hyperintense, mildly enhancing foci along the L1 and L2 inferior endplates. No destructive osseous lesion or epidural tumor is seen in the lumbar spine. Conus medullaris is normal in signal and terminates at L1-2. There is no significant disc or facet degeneration, and there is no lumbar stenosis.  IMPRESSION: 1. Large, destructive T5 lesion with severe pathologic compression fracture and extensive epidural tumor. Severe spinal stenosis with moderate cord compression. 2. Posterior element tumor involvement at T4. 3.  Small, scattered foci of mild enhancement elsewhere in the thoracic and lumbar spine as above, indeterminate with considerations including degenerative changes and small metastases. 4. No evidence of acute intracranial abnormality or intracranial metastatic disease. 5. No evidence of metastatic disease in the cervical spine. Critical Value/emergent results were called by telephone at the time of interpretation on 02/14/2015 at 5:55 pm to Dr. Deno Etienne , who verbally acknowledged these results.   Electronically Signed   By: Logan Bores M.D.   On: 02/14/2015 17:57   Mr Cervical Spine W Wo Contrast  02/14/2015   CLINICAL DATA:  Bilateral leg weakness and numbness for 1 week. History of breast cancer status post mastectomy in 2014.  EXAM: MRI HEAD WITHOUT AND WITH CONTRAST; MRI CERVICAL SPINE WITHOUT AND WITH CONTRAST; MRI LUMBAR SPINE WITHOUT AND WITH CONTRAST; MRI THORACIC SPINE WITHOUT AND WITH CONTRAST  TECHNIQUE: Multiplanar, multiecho pulse sequences of the brain and surrounding structures were obtained according to standard protocol without and with intravenous contrast; Multiplanar and multiecho pulse sequences of the cervical spine, to include the craniocervical junction and cervicothoracic junction, were obtained according to standard protocol without and with intravenous contrast.; Multiplanar and multiecho pulse sequences of the lumbar spine were obtained without and with intravenous contrast.; Multiplanar and multiecho pulse sequences of the thoracic spine were obtained without and with intravenous contrast.  CONTRAST:  47m MULTIHANCE GADOBENATE DIMEGLUMINE 529 MG/ML IV SOLN  COMPARISON:  Head CT 02/14/2015. CT abdomen and pelvis 02/06/2015. Brain MRI and PET-CT 03/22/2012.  FINDINGS: MRI HEAD:  There is no evidence of acute infarct, intracranial hemorrhage, mass, midline shift, or extra-axial fluid collection. Ventricles and sulci are normal. Punctate foci of T2 hyperintensity in the subcortical  cerebral white matter bilaterally are similar to the prior MRI and nonspecific but may reflect minimal chronic small vessel ischemic disease. No abnormal enhancement is identified.  Orbits are unremarkable. Paranasal sinuses and mastoid air cells are clear. Major intracranial vascular flow voids are preserved. No suspicious skull lesions are identified.  MRI CERVICAL SPINE:  Vertebral alignment is normal. Vertebral body heights are preserved. There is mild-to-moderate disc space narrowing at C5-6 with associated degenerative endplate changes. No lesions suspicious for metastatic disease are identified in the cervical spine. There is mild disc bulging at C5-6 and C6-7 without evidence of significant stenosis. Paraspinal soft tissues are unremarkable.  MRI THORACIC SPINE:  There is a severe pathologic compression fracture at T5 with focal kyphosis at this level. Tumor diffusely involves the T5 vertebral body and posterior elements and there is extensive circumferential epidural tumor in the spinal canal resulting in severe spinal stenosis and moderate cord compression. No substantial spinal cord edema is identified. Tumor also involves the right T4 inferior articular process and lamina and extends into the right greater than left neural foramina at T4-5 and T5-6 with associated foraminal stenosis. There is paravertebral soft tissue tumor at T5.  There is a 6 mm T2  hyperintense enhancing focus along the T6 superior endplate, and there is a faint T2 hyperintense and mildly enhancing focus in the inferior T12 vertebral body measuring 8 mm. No epidural tumor is identified elsewhere in the thoracic spine. Thoracic vertebral body heights are preserved elsewhere, and there is no significant thoracic spine degenerative change. Mild dependent atelectasis is present in the lungs.  MRI LUMBAR SPINE:  Vertebral alignment is normal. Vertebral body heights and intervertebral disc space heights are preserved. There is diffuse disc  desiccation with mild multilevel type 2 degenerative endplate marrow changes.  A 1 cm lesion in the L5 vertebral body is favored to represent a hemangioma. There are 6 mm T2 hyperintense, mildly enhancing foci along the L1 and L2 inferior endplates. No destructive osseous lesion or epidural tumor is seen in the lumbar spine. Conus medullaris is normal in signal and terminates at L1-2. There is no significant disc or facet degeneration, and there is no lumbar stenosis.  IMPRESSION: 1. Large, destructive T5 lesion with severe pathologic compression fracture and extensive epidural tumor. Severe spinal stenosis with moderate cord compression. 2. Posterior element tumor involvement at T4. 3. Small, scattered foci of mild enhancement elsewhere in the thoracic and lumbar spine as above, indeterminate with considerations including degenerative changes and small metastases. 4. No evidence of acute intracranial abnormality or intracranial metastatic disease. 5. No evidence of metastatic disease in the cervical spine. Critical Value/emergent results were called by telephone at the time of interpretation on 02/14/2015 at 5:55 pm to Dr. Deno Etienne , who verbally acknowledged these results.   Electronically Signed   By: Logan Bores M.D.   On: 02/14/2015 17:57   Mr Thoracic Spine W Wo Contrast  02/14/2015   CLINICAL DATA:  Bilateral leg weakness and numbness for 1 week. History of breast cancer status post mastectomy in 2014.  EXAM: MRI HEAD WITHOUT AND WITH CONTRAST; MRI CERVICAL SPINE WITHOUT AND WITH CONTRAST; MRI LUMBAR SPINE WITHOUT AND WITH CONTRAST; MRI THORACIC SPINE WITHOUT AND WITH CONTRAST  TECHNIQUE: Multiplanar, multiecho pulse sequences of the brain and surrounding structures were obtained according to standard protocol without and with intravenous contrast; Multiplanar and multiecho pulse sequences of the cervical spine, to include the craniocervical junction and cervicothoracic junction, were obtained according  to standard protocol without and with intravenous contrast.; Multiplanar and multiecho pulse sequences of the lumbar spine were obtained without and with intravenous contrast.; Multiplanar and multiecho pulse sequences of the thoracic spine were obtained without and with intravenous contrast.  CONTRAST:  27m MULTIHANCE GADOBENATE DIMEGLUMINE 529 MG/ML IV SOLN  COMPARISON:  Head CT 02/14/2015. CT abdomen and pelvis 02/06/2015. Brain MRI and PET-CT 03/22/2012.  FINDINGS: MRI HEAD:  There is no evidence of acute infarct, intracranial hemorrhage, mass, midline shift, or extra-axial fluid collection. Ventricles and sulci are normal. Punctate foci of T2 hyperintensity in the subcortical cerebral white matter bilaterally are similar to the prior MRI and nonspecific but may reflect minimal chronic small vessel ischemic disease. No abnormal enhancement is identified.  Orbits are unremarkable. Paranasal sinuses and mastoid air cells are clear. Major intracranial vascular flow voids are preserved. No suspicious skull lesions are identified.  MRI CERVICAL SPINE:  Vertebral alignment is normal. Vertebral body heights are preserved. There is mild-to-moderate disc space narrowing at C5-6 with associated degenerative endplate changes. No lesions suspicious for metastatic disease are identified in the cervical spine. There is mild disc bulging at C5-6 and C6-7 without evidence of significant stenosis. Paraspinal soft tissues are unremarkable.  MRI THORACIC SPINE:  There is a severe pathologic compression fracture at T5 with focal kyphosis at this level. Tumor diffusely involves the T5 vertebral body and posterior elements and there is extensive circumferential epidural tumor in the spinal canal resulting in severe spinal stenosis and moderate cord compression. No substantial spinal cord edema is identified. Tumor also involves the right T4 inferior articular process and lamina and extends into the right greater than left neural  foramina at T4-5 and T5-6 with associated foraminal stenosis. There is paravertebral soft tissue tumor at T5.  There is a 6 mm T2 hyperintense enhancing focus along the T6 superior endplate, and there is a faint T2 hyperintense and mildly enhancing focus in the inferior T12 vertebral body measuring 8 mm. No epidural tumor is identified elsewhere in the thoracic spine. Thoracic vertebral body heights are preserved elsewhere, and there is no significant thoracic spine degenerative change. Mild dependent atelectasis is present in the lungs.  MRI LUMBAR SPINE:  Vertebral alignment is normal. Vertebral body heights and intervertebral disc space heights are preserved. There is diffuse disc desiccation with mild multilevel type 2 degenerative endplate marrow changes.  A 1 cm lesion in the L5 vertebral body is favored to represent a hemangioma. There are 6 mm T2 hyperintense, mildly enhancing foci along the L1 and L2 inferior endplates. No destructive osseous lesion or epidural tumor is seen in the lumbar spine. Conus medullaris is normal in signal and terminates at L1-2. There is no significant disc or facet degeneration, and there is no lumbar stenosis.  IMPRESSION: 1. Large, destructive T5 lesion with severe pathologic compression fracture and extensive epidural tumor. Severe spinal stenosis with moderate cord compression. 2. Posterior element tumor involvement at T4. 3. Small, scattered foci of mild enhancement elsewhere in the thoracic and lumbar spine as above, indeterminate with considerations including degenerative changes and small metastases. 4. No evidence of acute intracranial abnormality or intracranial metastatic disease. 5. No evidence of metastatic disease in the cervical spine. Critical Value/emergent results were called by telephone at the time of interpretation on 02/14/2015 at 5:55 pm to Dr. Deno Etienne , who verbally acknowledged these results.   Electronically Signed   By: Logan Bores M.D.   On:  02/14/2015 17:57   Mr Lumbar Spine W Wo Contrast  02/14/2015   CLINICAL DATA:  Bilateral leg weakness and numbness for 1 week. History of breast cancer status post mastectomy in 2014.  EXAM: MRI HEAD WITHOUT AND WITH CONTRAST; MRI CERVICAL SPINE WITHOUT AND WITH CONTRAST; MRI LUMBAR SPINE WITHOUT AND WITH CONTRAST; MRI THORACIC SPINE WITHOUT AND WITH CONTRAST  TECHNIQUE: Multiplanar, multiecho pulse sequences of the brain and surrounding structures were obtained according to standard protocol without and with intravenous contrast; Multiplanar and multiecho pulse sequences of the cervical spine, to include the craniocervical junction and cervicothoracic junction, were obtained according to standard protocol without and with intravenous contrast.; Multiplanar and multiecho pulse sequences of the lumbar spine were obtained without and with intravenous contrast.; Multiplanar and multiecho pulse sequences of the thoracic spine were obtained without and with intravenous contrast.  CONTRAST:  43m MULTIHANCE GADOBENATE DIMEGLUMINE 529 MG/ML IV SOLN  COMPARISON:  Head CT 02/14/2015. CT abdomen and pelvis 02/06/2015. Brain MRI and PET-CT 03/22/2012.  FINDINGS: MRI HEAD:  There is no evidence of acute infarct, intracranial hemorrhage, mass, midline shift, or extra-axial fluid collection. Ventricles and sulci are normal. Punctate foci of T2 hyperintensity in the subcortical cerebral white matter bilaterally are similar to the prior MRI  and nonspecific but may reflect minimal chronic small vessel ischemic disease. No abnormal enhancement is identified.  Orbits are unremarkable. Paranasal sinuses and mastoid air cells are clear. Major intracranial vascular flow voids are preserved. No suspicious skull lesions are identified.  MRI CERVICAL SPINE:  Vertebral alignment is normal. Vertebral body heights are preserved. There is mild-to-moderate disc space narrowing at C5-6 with associated degenerative endplate changes. No lesions  suspicious for metastatic disease are identified in the cervical spine. There is mild disc bulging at C5-6 and C6-7 without evidence of significant stenosis. Paraspinal soft tissues are unremarkable.  MRI THORACIC SPINE:  There is a severe pathologic compression fracture at T5 with focal kyphosis at this level. Tumor diffusely involves the T5 vertebral body and posterior elements and there is extensive circumferential epidural tumor in the spinal canal resulting in severe spinal stenosis and moderate cord compression. No substantial spinal cord edema is identified. Tumor also involves the right T4 inferior articular process and lamina and extends into the right greater than left neural foramina at T4-5 and T5-6 with associated foraminal stenosis. There is paravertebral soft tissue tumor at T5.  There is a 6 mm T2 hyperintense enhancing focus along the T6 superior endplate, and there is a faint T2 hyperintense and mildly enhancing focus in the inferior T12 vertebral body measuring 8 mm. No epidural tumor is identified elsewhere in the thoracic spine. Thoracic vertebral body heights are preserved elsewhere, and there is no significant thoracic spine degenerative change. Mild dependent atelectasis is present in the lungs.  MRI LUMBAR SPINE:  Vertebral alignment is normal. Vertebral body heights and intervertebral disc space heights are preserved. There is diffuse disc desiccation with mild multilevel type 2 degenerative endplate marrow changes.  A 1 cm lesion in the L5 vertebral body is favored to represent a hemangioma. There are 6 mm T2 hyperintense, mildly enhancing foci along the L1 and L2 inferior endplates. No destructive osseous lesion or epidural tumor is seen in the lumbar spine. Conus medullaris is normal in signal and terminates at L1-2. There is no significant disc or facet degeneration, and there is no lumbar stenosis.  IMPRESSION: 1. Large, destructive T5 lesion with severe pathologic compression fracture  and extensive epidural tumor. Severe spinal stenosis with moderate cord compression. 2. Posterior element tumor involvement at T4. 3. Small, scattered foci of mild enhancement elsewhere in the thoracic and lumbar spine as above, indeterminate with considerations including degenerative changes and small metastases. 4. No evidence of acute intracranial abnormality or intracranial metastatic disease. 5. No evidence of metastatic disease in the cervical spine. Critical Value/emergent results were called by telephone at the time of interpretation on 02/14/2015 at 5:55 pm to Dr. Deno Etienne , who verbally acknowledged these results.   Electronically Signed   By: Logan Bores M.D.   On: 02/14/2015 17:57    Scheduled Meds: . dexamethasone  4 mg Intravenous 4 times per day  . enoxaparin (LOVENOX) injection  30 mg Subcutaneous Q24H  . famotidine  20 mg Oral BID  . letrozole  2.5 mg Oral Daily  . sodium chloride  3 mL Intravenous Q12H  . spironolactone  12.5 mg Oral Daily   Continuous Infusions: . 0.9 % NaCl with KCl 40 mEq / L 75 mL/hr (02/15/15 0033)    Principal Problem:   Spinal cord compression due to malignant neoplasm metastatic to spine [facility]) Active Problems:   Pathologic fracture of thoracic vertebrae   Epidural mass   HTN (hypertension)   Pathologic fracture  of vertebra   Breast cancer metastasized to bone ([place])   Hyperlipidemia    Time spent: 89 minutes    THOMPSON,DANIEL M.D. [facility] Pager [phone]. If 7PM-7AM, please contact night-coverage at [email or web address], password [facility] 02/15/2015, 12:03 PM  LOS: 1 day

## 2015-02-15 NOTE — Progress Notes (Signed)
[facility] hospitalist progress note. Chief complaint. Transfer note. History of present illness. This 69 year old female presented to [facility] long emergency room with complaints of leg weakness. She was diagnosed per radiology with a pathologic fracture of the thoracic vertebrae. Neurosurgery was contacted and they requested patient be transferred to [facility] for further evaluation and treatment. The patient is now arrived in transfer and I'm seeing her at bedside to ensure she remains clinically stable and that her orders transferred appropriately. Patient has no current medical complaints. Physical exam. Vital signs. Temperature 98.2, pulse 82, respiration 18, blood pressure 139/73. O2 sats 97%. General appearance. Well-developed elderly female who is alert and in no distress. Cardiac. Rate and rhythm regular. Lungs. Breath sounds clear and equal. Abdomen. Soft with positive bowel sounds. No pain. Neurologic. Decreased strength in both lower extremities. Grip strength strong and equal bilaterally. Impression/plan. Problem #1. Pathologic fracture of the thoracic vertebrae. For neurosurgery review. We'll follow for further recommendations. Problem #2. Hypertension. Continue home meds and follow blood pressure. Problem #3. Breast cancer with metastasis to the bone. Follow up per oncology. Problem #4. Hyperlipidemia. Continue statins and follow LFT. Problem #5. Hypokalemia.Potassium replacement and follow potassium level in the morning. Patient appears clinically stable post transfer. All orders appear to of transferred appropriately.

## 2015-02-15 NOTE — Anesthesia Preprocedure Evaluation (Addendum)
Anesthesia Evaluation  Patient identified by MRN, date of birth, ID band Patient awake    Reviewed: Allergy & Precautions, NPO status , Patient's Chart, lab work & pertinent test results  Airway Mallampati: I  TM Distance: >3 FB Neck ROM: Full    Dental  (+) Teeth Intact, Dental Advisory Given   Pulmonary    breath sounds clear to auscultation       Cardiovascular hypertension, Pt. on medications  Rhythm:Regular Rate:Normal     Neuro/Psych    GI/Hepatic hiatal hernia, GERD  Medicated and Controlled,  Endo/Other    Renal/GU      Musculoskeletal   Abdominal   Peds  Hematology   Anesthesia Other Findings PONV at last op but she believes it was due to oral vitamins being given immediately post op.  Reproductive/Obstetrics                           Anesthesia Physical Anesthesia Plan  ASA: III and emergent  Anesthesia Plan: General   Post-op Pain Management:    Induction: Intravenous  Airway Management Planned: Oral ETT  Additional Equipment:   Intra-op Plan:   Post-operative Plan: Extubation in OR  Informed Consent:   Plan Discussed with: Anesthesiologist and CRNA  Anesthesia Plan Comments:         Anesthesia Quick Evaluation

## 2015-02-15 NOTE — Anesthesia Procedure Notes (Signed)
Procedure Name: Intubation Date/Time: 02/15/2015 9:58 PM Performed by: Mariea Clonts Pre-anesthesia Checklist: Patient identified, Timeout performed, Emergency Drugs available, Patient being monitored and Suction available Patient Re-evaluated:Patient Re-evaluated prior to inductionOxygen Delivery Method: Circle system utilized Preoxygenation: Pre-oxygenation with 100% oxygen Intubation Type: IV induction Ventilation: Mask ventilation without difficulty Laryngoscope Size: Miller and 2 Grade View: Grade I Tube type: Oral Tube size: 7.0 mm Number of attempts: 1 Placement Confirmation: ETT inserted through vocal cords under direct vision,  breath sounds checked- equal and bilateral and positive ETCO2 Tube secured with: Tape Dental Injury: Teeth and Oropharynx as per pre-operative assessment

## 2015-02-16 ENCOUNTER — Encounter (HOSPITAL_COMMUNITY): Payer: Self-pay | Admitting: Neurosurgery

## 2015-02-16 ENCOUNTER — Inpatient Hospital Stay (HOSPITAL_COMMUNITY): Payer: Medicare Other

## 2015-02-16 DIAGNOSIS — M8448XD Pathological fracture, other site, subsequent encounter for fracture with routine healing: Secondary | ICD-10-CM

## 2015-02-16 DIAGNOSIS — Z170 Estrogen receptor positive status [ER+]: Secondary | ICD-10-CM

## 2015-02-16 DIAGNOSIS — G9529 Other cord compression: Secondary | ICD-10-CM

## 2015-02-16 DIAGNOSIS — C50411 Malignant neoplasm of upper-outer quadrant of right female breast: Secondary | ICD-10-CM

## 2015-02-16 LAB — COMPREHENSIVE METABOLIC PANEL
ALT: 24 U/L (ref 14–54)
AST: 54 U/L — ABNORMAL HIGH (ref 15–41)
Albumin: 3 g/dL — ABNORMAL LOW (ref 3.5–5.0)
Alkaline Phosphatase: 73 U/L (ref 38–126)
Anion gap: 14 (ref 5–15)
BUN: 15 mg/dL (ref 6–20)
CO2: 21 mmol/L — ABNORMAL LOW (ref 22–32)
Calcium: 9.4 mg/dL (ref 8.9–10.3)
Chloride: 104 mmol/L (ref 101–111)
Creatinine, Ser: 1.12 mg/dL — ABNORMAL HIGH (ref 0.44–1.00)
GFR calc Af Amer: 57 mL/min — ABNORMAL LOW (ref >60)
GFR calc non Af Amer: 49 mL/min — ABNORMAL LOW (ref >60)
Glucose, Bld: 204 mg/dL — ABNORMAL HIGH (ref 65–99)
Potassium: 3.8 mmol/L (ref 3.5–5.1)
Sodium: 139 mmol/L (ref 135–145)
Total Bilirubin: 0.7 mg/dL (ref 0.3–1.2)
Total Protein: 6.3 g/dL — ABNORMAL LOW (ref 6.5–8.1)

## 2015-02-16 LAB — CBC WITH DIFFERENTIAL/PLATELET
Basophils Absolute: 0 10*3/uL (ref 0.0–0.1)
Basophils Relative: 0 %
Eosinophils Absolute: 0 10*3/uL (ref 0.0–0.7)
Eosinophils Relative: 0 %
HCT: 34.9 % — ABNORMAL LOW (ref 36.0–46.0)
Hemoglobin: 11.8 g/dL — ABNORMAL LOW (ref 12.0–15.0)
Lymphocytes Relative: 3 %
Lymphs Abs: 0.5 10*3/uL — ABNORMAL LOW (ref 0.7–4.0)
MCH: 28.9 pg (ref 26.0–34.0)
MCHC: 33.8 g/dL (ref 30.0–36.0)
MCV: 85.5 fL (ref 78.0–100.0)
Monocytes Absolute: 1.3 10*3/uL — ABNORMAL HIGH (ref 0.1–1.0)
Monocytes Relative: 7 %
Neutro Abs: 16.1 10*3/uL — ABNORMAL HIGH (ref 1.7–7.7)
Neutrophils Relative %: 90 %
Platelets: 193 10*3/uL (ref 150–400)
RBC: 4.08 MIL/uL (ref 3.87–5.11)
RDW: 12.8 % (ref 11.5–15.5)
WBC: 17.9 10*3/uL — ABNORMAL HIGH (ref 4.0–10.5)

## 2015-02-16 MED ORDER — OXYCODONE HCL 5 MG PO TABS
ORAL_TABLET | ORAL | Status: AC
  Filled 2015-02-16: qty 1

## 2015-02-16 MED ORDER — EPHEDRINE SULFATE 50 MG/ML IJ SOLN
INTRAMUSCULAR | Status: DC | PRN
  Administered 2015-02-16 (×2): 10 mg via INTRAVENOUS

## 2015-02-16 MED ORDER — MEPERIDINE HCL 25 MG/ML IJ SOLN: 6.2500 mg | INTRAMUSCULAR | Status: DC | PRN

## 2015-02-16 MED ORDER — HYDROMORPHONE HCL 1 MG/ML IJ SOLN
INTRAMUSCULAR | Status: AC
  Filled 2015-02-16: qty 1

## 2015-02-16 MED ORDER — ENOXAPARIN SODIUM 30 MG/0.3ML ~~LOC~~ SOLN
30.0000 mg | SUBCUTANEOUS | Status: DC
  Administered 2015-02-17: 30 mg via SUBCUTANEOUS
  Filled 2015-02-16: qty 0.3

## 2015-02-16 MED ORDER — HYDROMORPHONE HCL 1 MG/ML IJ SOLN
1.0000 mg | INTRAMUSCULAR | Status: DC | PRN
  Administered 2015-02-16: 1 mg via INTRAVENOUS
  Filled 2015-02-16: qty 1

## 2015-02-16 MED ORDER — HYDROMORPHONE HCL 1 MG/ML IJ SOLN
0.2500 mg | INTRAMUSCULAR | Status: DC | PRN
  Administered 2015-02-16 (×3): 0.5 mg via INTRAVENOUS

## 2015-02-16 MED ORDER — OXYCODONE-ACETAMINOPHEN 5-325 MG PO TABS
1.0000 | ORAL_TABLET | ORAL | Status: DC | PRN
  Administered 2015-02-16: 2 via ORAL
  Administered 2015-02-16: 1 via ORAL
  Administered 2015-02-16 – 2015-02-18 (×6): 2 via ORAL
  Filled 2015-02-16: qty 2
  Filled 2015-02-16: qty 1
  Filled 2015-02-16 (×6): qty 2

## 2015-02-16 MED ORDER — GLYCOPYRROLATE 0.2 MG/ML IJ SOLN
INTRAMUSCULAR | Status: DC | PRN
  Administered 2015-02-16: 0.3 mg via INTRAVENOUS

## 2015-02-16 MED ORDER — OXYCODONE HCL 5 MG/5ML PO SOLN: 5.0000 mg | Freq: Once | ORAL | Status: AC | PRN

## 2015-02-16 MED ORDER — METHOCARBAMOL 500 MG PO TABS
ORAL_TABLET | ORAL | Status: AC
  Filled 2015-02-16: qty 1

## 2015-02-16 MED ORDER — NEOSTIGMINE METHYLSULFATE 10 MG/10ML IV SOLN
INTRAVENOUS | Status: DC | PRN
  Administered 2015-02-16: 2 mg via INTRAVENOUS

## 2015-02-16 MED ORDER — GLYCOPYRROLATE 0.2 MG/ML IJ SOLN
INTRAMUSCULAR | Status: AC
  Filled 2015-02-16: qty 2

## 2015-02-16 MED ORDER — ONDANSETRON HCL 4 MG/2ML IJ SOLN
INTRAMUSCULAR | Status: DC | PRN
  Administered 2015-02-16: 4 mg via INTRAVENOUS

## 2015-02-16 MED ORDER — HYDROMORPHONE HCL 1 MG/ML IJ SOLN
0.2500 mg | INTRAMUSCULAR | Status: DC | PRN
  Administered 2015-02-16 (×4): 0.5 mg via INTRAVENOUS

## 2015-02-16 MED ORDER — OXYCODONE HCL 5 MG PO TABS
5.0000 mg | ORAL_TABLET | Freq: Once | ORAL | Status: AC | PRN
  Administered 2015-02-16: 5 mg via ORAL

## 2015-02-16 NOTE — Progress Notes (Signed)
[facility] PROGRESS NOTE  Heather [name] HER:[id number] DOB: [birth year] DOA: 02/14/2015 PCP: Vidal Schwalbe, MD  Assessment/Plan: #1 spinal cord compression with myelopathy and inability to walk/epidural tumor Per MRI of the T-spine. CT of the T-spine has been ordered by neurosurgery. Patient s/p operative decompression/separation at T5 and stabilization from T3-T7 per neurosurgery 02/16/2015. Continue Decadron. Oncology has been informed of patient's admission. Patient may need radiation therapy however will defer to oncology and neurosurgery. PT/OT. Per neurosurgery.  #2 pathologic fracture of the T-spine Secondary to problem #1. Per neurosurgery.  #3 hypertension Stable. Continue home regimen of Aldactone.  #4 history of breast cancer Status post right mastectomy being followed by oncology. Continue femara. Oncology, [name] has been informed of patient's admission.   #5 hyperlipidemia Continue statin.  #6 hypokalemia Repleted.  #7 prophylaxis Pepcid for GI prophylaxis. Lovenox for DVT prophylaxis.  Code Status: Full Family Communication: Updated patient, no family at bedside. Disposition Plan: Per neurosurgery. Likely will need SNF versus in patient rehabilitation.   Consultants:  Neurosurgery: Dr. Kathyrn Sheriff 02/15/2015  Oncology pending  Procedures:  CT head 02/14/2015  CT T-spine 02/15/2015  MRI head 02/14/2015, MRI C, L, T-spine 02/14/2015 1. T5 laminectomy, bilateral transpedicular decompression of thecal sac 2. T3-T7 non-segmental pedicle screw stabilization----per Dr.Nundkumar 02/16/2015   Antibiotics:  None  HPI/Subjective: Patient laying in bed. Patient states back pain and chest pain improved postoperatively. Patient states some strength in her legs. No shortness of breath.  Objective: Filed Vitals:   02/16/15 0954  BP: 121/55  Pulse: 76  Temp: 97.5 F (36.4 C)  Resp: 20    Intake/Output Summary (Last 24 hours) at 02/16/15  1145 Last data filed at 02/16/15 0300  Gross per 24 hour  Intake   1860 ml  Output   3050 ml  Net  -1190 ml   Filed Weights   02/14/15 1331  Weight: 58.968 kg (130 lb)    Exam:   General:  NAD  Cardiovascular: RRR  Respiratory: CTAB  Abdomen: Soft, nontender, nondistended, positive bowel sounds.  Musculoskeletal: No clubbing cyanosis or edema.  Data Reviewed: Basic Metabolic Panel:  Recent Labs Lab 02/14/15 1309 02/14/15 2335 02/15/15 0325 02/16/15 0528  NA 134*  --  137 139  K 3.2*  --  3.8 3.8  CL 96*  --  97* 104  CO2 28  --  27 21*  GLUCOSE 188*  --  90 204*  BUN 10  --  10 15  CREATININE 0.89 1.09* 0.94 1.12*  CALCIUM 10.2  --  9.8 9.4  MG  --   --  1.9  --   PHOS  --   --  3.7  --    Liver Function Tests:  Recent Labs Lab 02/14/15 1309 02/15/15 0325 02/16/15 0528  AST 32 31 54*  ALT '18 19 24  '$ ALKPHOS 92 86 73  BILITOT 0.7 0.6 0.7  PROT 7.9 5.8* 6.3*  ALBUMIN 4.3 3.5 3.0*   No results for input(s): LIPASE, AMYLASE in the last 168 hours. No results for input(s): AMMONIA in the last 168 hours. CBC:  Recent Labs Lab 02/14/15 1309 02/14/15 2335 02/15/15 0325 02/16/15 0528  WBC 7.8 7.7 6.5 17.9*  NEUTROABS 6.6  --  3.9 16.1*  HGB 14.3 14.5 13.7 11.8*  HCT 40.9 42.4 41.2 34.9*  MCV 84.9 85.8 86.6 85.5  PLT 225 217 207 193   Cardiac Enzymes: No results for input(s): CKTOTAL, CKMB, CKMBINDEX, TROPONINI in the last 168 hours. BNP (last  3 results) No results for input(s): BNP in the last 8760 hours.  ProBNP (last 3 results) No results for input(s): PROBNP in the last 8760 hours.  CBG: No results for input(s): GLUCAP in the last 168 hours.  No results found for this or any previous visit (from the past 240 hour(s)).   Studies: Dg Thoracic Spine 2 View  02/16/2015   CLINICAL DATA:  Surgical fixation of thoracic spine  EXAM: OPERATIVE THORACIC SPINE 2 VIEW(S)  COMPARISON:  CT thoracic spine 02/15/2015  FINDINGS: Intraoperative  fluoroscopy is utilized for surgical control purposes. Fluoroscopy time is recorded at 2 minutes 10 seconds. Exposure dose is not indicated.  Postoperative changes with posterior rod and screw fixation of the upper thoracic spine bridging the destructive lesion demonstrated at T5 on previous CT scan. Thoracic spine alignment appears grossly intact. Enteric and endotracheal tubes are noted.  IMPRESSION: Intraoperative fluoroscopy utilized for surgical control purposes demonstrating posterior fixation of the upper thoracic spine.   Electronically Signed   By: Lucienne Capers M.D.   On: 02/16/2015 01:22   Ct Head Wo Contrast  02/14/2015   CLINICAL DATA:  Bilateral lower extremity weakness.  EXAM: CT HEAD WITHOUT CONTRAST  TECHNIQUE: Contiguous axial images were obtained from the base of the skull through the vertex without intravenous contrast.  COMPARISON:  MRI scan of March 22, 2012.  FINDINGS: Bony calvarium appears intact. No mass effect or midline shift is noted. Ventricular size is within normal limits. There is no evidence of mass lesion, hemorrhage or acute infarction.  IMPRESSION: Normal head CT.   Electronically Signed   By: Marijo Conception, M.D.   On: 02/14/2015 14:24   Ct Thoracic Spine Wo Contrast  02/15/2015   CLINICAL DATA:  Metastatic breast cancer with destructive tumor at T4. Preoperative evaluation.  EXAM: CT THORACIC SPINE WITHOUT CONTRAST  TECHNIQUE: Multidetector CT imaging of the thoracic spine was performed without intravenous contrast administration. Multiplanar CT image reconstructions were also generated.  COMPARISON:  MRI 02/14/2015  FINDINGS: No significant finding from C7 through T3.  At T4, there is tumor involvement with destruction of the lamina and inferior facet on the right. Lamina on the left is intact.  At T5, there is a pathologic compression fracture with loss of height of 50-60%. The posterior portion of the vertebral body shows lytic destruction and tumor encroaches  upon the spinal canal as better shown by MRI. There is lytic destruction of the posterior elements also at this level bilaterally. The ribs are intact. The spinous processes largely intact.  At T6, there is a small lesion in the anterior aspect of the vertebral body very difficult to see by CT. There is possible or early involvement of the right superior articular facet better shown at MRI.  Below that, no abnormality in the thoracic region is visible by CT.  IMPRESSION: Lytic destructive lesion of the right lamina and inferior articular facet at T4.  Lytic destruction of the posterior aspect of the T5 vertebral body with pathologic compression fracture resulting in loss of height of 50-60%. Tumor compromise of the canal better shown at MRI. Lytic destruction of the posterior elements at the T5 level is well. The ribs are not involved.  Minimal involvement of the anterior superior aspect of the T6 vertebral body and possibly of the superior articular facet on the right at T6. Abnormalities better shown at MR at that level.   Electronically Signed   By: Nelson Chimes M.D.   On:  02/15/2015 14:24   Mr Jeri Cos NL Contrast  02/14/2015   CLINICAL DATA:  Bilateral leg weakness and numbness for 1 week. History of breast cancer status post mastectomy in 2014.  EXAM: MRI HEAD WITHOUT AND WITH CONTRAST; MRI CERVICAL SPINE WITHOUT AND WITH CONTRAST; MRI LUMBAR SPINE WITHOUT AND WITH CONTRAST; MRI THORACIC SPINE WITHOUT AND WITH CONTRAST  TECHNIQUE: Multiplanar, multiecho pulse sequences of the brain and surrounding structures were obtained according to standard protocol without and with intravenous contrast; Multiplanar and multiecho pulse sequences of the cervical spine, to include the craniocervical junction and cervicothoracic junction, were obtained according to standard protocol without and with intravenous contrast.; Multiplanar and multiecho pulse sequences of the lumbar spine were obtained without and with intravenous  contrast.; Multiplanar and multiecho pulse sequences of the thoracic spine were obtained without and with intravenous contrast.  CONTRAST:  78m MULTIHANCE GADOBENATE DIMEGLUMINE 529 MG/ML IV SOLN  COMPARISON:  Head CT 02/14/2015. CT abdomen and pelvis 02/06/2015. Brain MRI and PET-CT 03/22/2012.  FINDINGS: MRI HEAD:  There is no evidence of acute infarct, intracranial hemorrhage, mass, midline shift, or extra-axial fluid collection. Ventricles and sulci are normal. Punctate foci of T2 hyperintensity in the subcortical cerebral white matter bilaterally are similar to the prior MRI and nonspecific but may reflect minimal chronic small vessel ischemic disease. No abnormal enhancement is identified.  Orbits are unremarkable. Paranasal sinuses and mastoid air cells are clear. Major intracranial vascular flow voids are preserved. No suspicious skull lesions are identified.  MRI CERVICAL SPINE:  Vertebral alignment is normal. Vertebral body heights are preserved. There is mild-to-moderate disc space narrowing at C5-6 with associated degenerative endplate changes. No lesions suspicious for metastatic disease are identified in the cervical spine. There is mild disc bulging at C5-6 and C6-7 without evidence of significant stenosis. Paraspinal soft tissues are unremarkable.  MRI THORACIC SPINE:  There is a severe pathologic compression fracture at T5 with focal kyphosis at this level. Tumor diffusely involves the T5 vertebral body and posterior elements and there is extensive circumferential epidural tumor in the spinal canal resulting in severe spinal stenosis and moderate cord compression. No substantial spinal cord edema is identified. Tumor also involves the right T4 inferior articular process and lamina and extends into the right greater than left neural foramina at T4-5 and T5-6 with associated foraminal stenosis. There is paravertebral soft tissue tumor at T5.  There is a 6 mm T2 hyperintense enhancing focus along the  T6 superior endplate, and there is a faint T2 hyperintense and mildly enhancing focus in the inferior T12 vertebral body measuring 8 mm. No epidural tumor is identified elsewhere in the thoracic spine. Thoracic vertebral body heights are preserved elsewhere, and there is no significant thoracic spine degenerative change. Mild dependent atelectasis is present in the lungs.  MRI LUMBAR SPINE:  Vertebral alignment is normal. Vertebral body heights and intervertebral disc space heights are preserved. There is diffuse disc desiccation with mild multilevel type 2 degenerative endplate marrow changes.  A 1 cm lesion in the L5 vertebral body is favored to represent a hemangioma. There are 6 mm T2 hyperintense, mildly enhancing foci along the L1 and L2 inferior endplates. No destructive osseous lesion or epidural tumor is seen in the lumbar spine. Conus medullaris is normal in signal and terminates at L1-2. There is no significant disc or facet degeneration, and there is no lumbar stenosis.  IMPRESSION: 1. Large, destructive T5 lesion with severe pathologic compression fracture and extensive epidural tumor. Severe spinal  stenosis with moderate cord compression. 2. Posterior element tumor involvement at T4. 3. Small, scattered foci of mild enhancement elsewhere in the thoracic and lumbar spine as above, indeterminate with considerations including degenerative changes and small metastases. 4. No evidence of acute intracranial abnormality or intracranial metastatic disease. 5. No evidence of metastatic disease in the cervical spine. Critical Value/emergent results were called by telephone at the time of interpretation on 02/14/2015 at 5:55 pm to Dr. Deno Etienne , who verbally acknowledged these results.   Electronically Signed   By: Logan Bores M.D.   On: 02/14/2015 17:57   Mr Cervical Spine W Wo Contrast  02/14/2015   CLINICAL DATA:  Bilateral leg weakness and numbness for 1 week. History of breast cancer status post  mastectomy in 2014.  EXAM: MRI HEAD WITHOUT AND WITH CONTRAST; MRI CERVICAL SPINE WITHOUT AND WITH CONTRAST; MRI LUMBAR SPINE WITHOUT AND WITH CONTRAST; MRI THORACIC SPINE WITHOUT AND WITH CONTRAST  TECHNIQUE: Multiplanar, multiecho pulse sequences of the brain and surrounding structures were obtained according to standard protocol without and with intravenous contrast; Multiplanar and multiecho pulse sequences of the cervical spine, to include the craniocervical junction and cervicothoracic junction, were obtained according to standard protocol without and with intravenous contrast.; Multiplanar and multiecho pulse sequences of the lumbar spine were obtained without and with intravenous contrast.; Multiplanar and multiecho pulse sequences of the thoracic spine were obtained without and with intravenous contrast.  CONTRAST:  41m MULTIHANCE GADOBENATE DIMEGLUMINE 529 MG/ML IV SOLN  COMPARISON:  Head CT 02/14/2015. CT abdomen and pelvis 02/06/2015. Brain MRI and PET-CT 03/22/2012.  FINDINGS: MRI HEAD:  There is no evidence of acute infarct, intracranial hemorrhage, mass, midline shift, or extra-axial fluid collection. Ventricles and sulci are normal. Punctate foci of T2 hyperintensity in the subcortical cerebral white matter bilaterally are similar to the prior MRI and nonspecific but may reflect minimal chronic small vessel ischemic disease. No abnormal enhancement is identified.  Orbits are unremarkable. Paranasal sinuses and mastoid air cells are clear. Major intracranial vascular flow voids are preserved. No suspicious skull lesions are identified.  MRI CERVICAL SPINE:  Vertebral alignment is normal. Vertebral body heights are preserved. There is mild-to-moderate disc space narrowing at C5-6 with associated degenerative endplate changes. No lesions suspicious for metastatic disease are identified in the cervical spine. There is mild disc bulging at C5-6 and C6-7 without evidence of significant stenosis.  Paraspinal soft tissues are unremarkable.  MRI THORACIC SPINE:  There is a severe pathologic compression fracture at T5 with focal kyphosis at this level. Tumor diffusely involves the T5 vertebral body and posterior elements and there is extensive circumferential epidural tumor in the spinal canal resulting in severe spinal stenosis and moderate cord compression. No substantial spinal cord edema is identified. Tumor also involves the right T4 inferior articular process and lamina and extends into the right greater than left neural foramina at T4-5 and T5-6 with associated foraminal stenosis. There is paravertebral soft tissue tumor at T5.  There is a 6 mm T2 hyperintense enhancing focus along the T6 superior endplate, and there is a faint T2 hyperintense and mildly enhancing focus in the inferior T12 vertebral body measuring 8 mm. No epidural tumor is identified elsewhere in the thoracic spine. Thoracic vertebral body heights are preserved elsewhere, and there is no significant thoracic spine degenerative change. Mild dependent atelectasis is present in the lungs.  MRI LUMBAR SPINE:  Vertebral alignment is normal. Vertebral body heights and intervertebral disc space heights are preserved. There  is diffuse disc desiccation with mild multilevel type 2 degenerative endplate marrow changes.  A 1 cm lesion in the L5 vertebral body is favored to represent a hemangioma. There are 6 mm T2 hyperintense, mildly enhancing foci along the L1 and L2 inferior endplates. No destructive osseous lesion or epidural tumor is seen in the lumbar spine. Conus medullaris is normal in signal and terminates at L1-2. There is no significant disc or facet degeneration, and there is no lumbar stenosis.  IMPRESSION: 1. Large, destructive T5 lesion with severe pathologic compression fracture and extensive epidural tumor. Severe spinal stenosis with moderate cord compression. 2. Posterior element tumor involvement at T4. 3. Small, scattered foci  of mild enhancement elsewhere in the thoracic and lumbar spine as above, indeterminate with considerations including degenerative changes and small metastases. 4. No evidence of acute intracranial abnormality or intracranial metastatic disease. 5. No evidence of metastatic disease in the cervical spine. Critical Value/emergent results were called by telephone at the time of interpretation on 02/14/2015 at 5:55 pm to Dr. Deno Etienne , who verbally acknowledged these results.   Electronically Signed   By: Logan Bores M.D.   On: 02/14/2015 17:57   Mr Thoracic Spine W Wo Contrast  02/14/2015   CLINICAL DATA:  Bilateral leg weakness and numbness for 1 week. History of breast cancer status post mastectomy in 2014.  EXAM: MRI HEAD WITHOUT AND WITH CONTRAST; MRI CERVICAL SPINE WITHOUT AND WITH CONTRAST; MRI LUMBAR SPINE WITHOUT AND WITH CONTRAST; MRI THORACIC SPINE WITHOUT AND WITH CONTRAST  TECHNIQUE: Multiplanar, multiecho pulse sequences of the brain and surrounding structures were obtained according to standard protocol without and with intravenous contrast; Multiplanar and multiecho pulse sequences of the cervical spine, to include the craniocervical junction and cervicothoracic junction, were obtained according to standard protocol without and with intravenous contrast.; Multiplanar and multiecho pulse sequences of the lumbar spine were obtained without and with intravenous contrast.; Multiplanar and multiecho pulse sequences of the thoracic spine were obtained without and with intravenous contrast.  CONTRAST:  3m MULTIHANCE GADOBENATE DIMEGLUMINE 529 MG/ML IV SOLN  COMPARISON:  Head CT 02/14/2015. CT abdomen and pelvis 02/06/2015. Brain MRI and PET-CT 03/22/2012.  FINDINGS: MRI HEAD:  There is no evidence of acute infarct, intracranial hemorrhage, mass, midline shift, or extra-axial fluid collection. Ventricles and sulci are normal. Punctate foci of T2 hyperintensity in the subcortical cerebral white matter  bilaterally are similar to the prior MRI and nonspecific but may reflect minimal chronic small vessel ischemic disease. No abnormal enhancement is identified.  Orbits are unremarkable. Paranasal sinuses and mastoid air cells are clear. Major intracranial vascular flow voids are preserved. No suspicious skull lesions are identified.  MRI CERVICAL SPINE:  Vertebral alignment is normal. Vertebral body heights are preserved. There is mild-to-moderate disc space narrowing at C5-6 with associated degenerative endplate changes. No lesions suspicious for metastatic disease are identified in the cervical spine. There is mild disc bulging at C5-6 and C6-7 without evidence of significant stenosis. Paraspinal soft tissues are unremarkable.  MRI THORACIC SPINE:  There is a severe pathologic compression fracture at T5 with focal kyphosis at this level. Tumor diffusely involves the T5 vertebral body and posterior elements and there is extensive circumferential epidural tumor in the spinal canal resulting in severe spinal stenosis and moderate cord compression. No substantial spinal cord edema is identified. Tumor also involves the right T4 inferior articular process and lamina and extends into the right greater than left neural foramina at T4-5 and T5-6 with  associated foraminal stenosis. There is paravertebral soft tissue tumor at T5.  There is a 6 mm T2 hyperintense enhancing focus along the T6 superior endplate, and there is a faint T2 hyperintense and mildly enhancing focus in the inferior T12 vertebral body measuring 8 mm. No epidural tumor is identified elsewhere in the thoracic spine. Thoracic vertebral body heights are preserved elsewhere, and there is no significant thoracic spine degenerative change. Mild dependent atelectasis is present in the lungs.  MRI LUMBAR SPINE:  Vertebral alignment is normal. Vertebral body heights and intervertebral disc space heights are preserved. There is diffuse disc desiccation with mild  multilevel type 2 degenerative endplate marrow changes.  A 1 cm lesion in the L5 vertebral body is favored to represent a hemangioma. There are 6 mm T2 hyperintense, mildly enhancing foci along the L1 and L2 inferior endplates. No destructive osseous lesion or epidural tumor is seen in the lumbar spine. Conus medullaris is normal in signal and terminates at L1-2. There is no significant disc or facet degeneration, and there is no lumbar stenosis.  IMPRESSION: 1. Large, destructive T5 lesion with severe pathologic compression fracture and extensive epidural tumor. Severe spinal stenosis with moderate cord compression. 2. Posterior element tumor involvement at T4. 3. Small, scattered foci of mild enhancement elsewhere in the thoracic and lumbar spine as above, indeterminate with considerations including degenerative changes and small metastases. 4. No evidence of acute intracranial abnormality or intracranial metastatic disease. 5. No evidence of metastatic disease in the cervical spine. Critical Value/emergent results were called by telephone at the time of interpretation on 02/14/2015 at 5:55 pm to Dr. Deno Etienne , who verbally acknowledged these results.   Electronically Signed   By: Logan Bores M.D.   On: 02/14/2015 17:57   Mr Lumbar Spine W Wo Contrast  02/14/2015   CLINICAL DATA:  Bilateral leg weakness and numbness for 1 week. History of breast cancer status post mastectomy in 2014.  EXAM: MRI HEAD WITHOUT AND WITH CONTRAST; MRI CERVICAL SPINE WITHOUT AND WITH CONTRAST; MRI LUMBAR SPINE WITHOUT AND WITH CONTRAST; MRI THORACIC SPINE WITHOUT AND WITH CONTRAST  TECHNIQUE: Multiplanar, multiecho pulse sequences of the brain and surrounding structures were obtained according to standard protocol without and with intravenous contrast; Multiplanar and multiecho pulse sequences of the cervical spine, to include the craniocervical junction and cervicothoracic junction, were obtained according to standard protocol  without and with intravenous contrast.; Multiplanar and multiecho pulse sequences of the lumbar spine were obtained without and with intravenous contrast.; Multiplanar and multiecho pulse sequences of the thoracic spine were obtained without and with intravenous contrast.  CONTRAST:  30m MULTIHANCE GADOBENATE DIMEGLUMINE 529 MG/ML IV SOLN  COMPARISON:  Head CT 02/14/2015. CT abdomen and pelvis 02/06/2015. Brain MRI and PET-CT 03/22/2012.  FINDINGS: MRI HEAD:  There is no evidence of acute infarct, intracranial hemorrhage, mass, midline shift, or extra-axial fluid collection. Ventricles and sulci are normal. Punctate foci of T2 hyperintensity in the subcortical cerebral white matter bilaterally are similar to the prior MRI and nonspecific but may reflect minimal chronic small vessel ischemic disease. No abnormal enhancement is identified.  Orbits are unremarkable. Paranasal sinuses and mastoid air cells are clear. Major intracranial vascular flow voids are preserved. No suspicious skull lesions are identified.  MRI CERVICAL SPINE:  Vertebral alignment is normal. Vertebral body heights are preserved. There is mild-to-moderate disc space narrowing at C5-6 with associated degenerative endplate changes. No lesions suspicious for metastatic disease are identified in the cervical spine. There is  mild disc bulging at C5-6 and C6-7 without evidence of significant stenosis. Paraspinal soft tissues are unremarkable.  MRI THORACIC SPINE:  There is a severe pathologic compression fracture at T5 with focal kyphosis at this level. Tumor diffusely involves the T5 vertebral body and posterior elements and there is extensive circumferential epidural tumor in the spinal canal resulting in severe spinal stenosis and moderate cord compression. No substantial spinal cord edema is identified. Tumor also involves the right T4 inferior articular process and lamina and extends into the right greater than left neural foramina at T4-5 and  T5-6 with associated foraminal stenosis. There is paravertebral soft tissue tumor at T5.  There is a 6 mm T2 hyperintense enhancing focus along the T6 superior endplate, and there is a faint T2 hyperintense and mildly enhancing focus in the inferior T12 vertebral body measuring 8 mm. No epidural tumor is identified elsewhere in the thoracic spine. Thoracic vertebral body heights are preserved elsewhere, and there is no significant thoracic spine degenerative change. Mild dependent atelectasis is present in the lungs.  MRI LUMBAR SPINE:  Vertebral alignment is normal. Vertebral body heights and intervertebral disc space heights are preserved. There is diffuse disc desiccation with mild multilevel type 2 degenerative endplate marrow changes.  A 1 cm lesion in the L5 vertebral body is favored to represent a hemangioma. There are 6 mm T2 hyperintense, mildly enhancing foci along the L1 and L2 inferior endplates. No destructive osseous lesion or epidural tumor is seen in the lumbar spine. Conus medullaris is normal in signal and terminates at L1-2. There is no significant disc or facet degeneration, and there is no lumbar stenosis.  IMPRESSION: 1. Large, destructive T5 lesion with severe pathologic compression fracture and extensive epidural tumor. Severe spinal stenosis with moderate cord compression. 2. Posterior element tumor involvement at T4. 3. Small, scattered foci of mild enhancement elsewhere in the thoracic and lumbar spine as above, indeterminate with considerations including degenerative changes and small metastases. 4. No evidence of acute intracranial abnormality or intracranial metastatic disease. 5. No evidence of metastatic disease in the cervical spine. Critical Value/emergent results were called by telephone at the time of interpretation on 02/14/2015 at 5:55 pm to Dr. Deno Etienne , who verbally acknowledged these results.   Electronically Signed   By: Logan Bores M.D.   On: 02/14/2015 17:57   Dg C-arm  Gt 120 Min  02/16/2015   CLINICAL DATA:  Surgical fixation of thoracic spine  EXAM: OPERATIVE THORACIC SPINE 2 VIEW(S)  COMPARISON:  CT thoracic spine 02/15/2015  FINDINGS: Intraoperative fluoroscopy is utilized for surgical control purposes. Fluoroscopy time is recorded at 2 minutes 10 seconds. Exposure dose is not indicated.  Postoperative changes with posterior rod and screw fixation of the upper thoracic spine bridging the destructive lesion demonstrated at T5 on previous CT scan. Thoracic spine alignment appears grossly intact. Enteric and endotracheal tubes are noted.  IMPRESSION: Intraoperative fluoroscopy utilized for surgical control purposes demonstrating posterior fixation of the upper thoracic spine.   Electronically Signed   By: Lucienne Capers M.D.   On: 02/16/2015 01:22    Scheduled Meds: . dexamethasone  4 mg Intravenous 4 times per day  . [START ON 02/17/2015] enoxaparin (LOVENOX) injection  30 mg Subcutaneous Q24H  . famotidine  20 mg Oral BID  . HYDROmorphone      . HYDROmorphone      . HYDROmorphone      . HYDROmorphone      . letrozole  2.5 mg  Oral Daily  . methocarbamol      . oxyCODONE      . sodium chloride  3 mL Intravenous Q12H  . spironolactone  12.5 mg Oral Daily   Continuous Infusions: . 0.9 % NaCl with KCl 40 mEq / L 75 mL/hr (02/16/15 0526)    Principal Problem:   Spinal cord compression due to malignant neoplasm metastatic to spine [facility]) Active Problems:   Pathologic fracture of thoracic vertebrae   Epidural mass   HTN (hypertension)   Pathologic fracture of vertebra   Breast cancer metastasized to bone [facility])   Hyperlipidemia   Thoracic spine tumor    Time spent: 83 minutes    [facility] M.D. [facility] Pager [phone]. If 7PM-7AM, please contact night-coverage at [email or web address], password [facility] 02/16/2015, 11:45 AM  LOS: 2 days

## 2015-02-16 NOTE — Progress Notes (Signed)
OT Cancellation Note  Patient Details Name: Heather [name] MRN: [id number] DOB: 1946-01-06   Cancelled Treatment:    Reason Eval/Treat Not Completed: Other (comment) Pt eating dinner at this time.   Benito Mccreedy OTR/L [phone] 02/16/2015, 5:24 PM

## 2015-02-16 NOTE — Progress Notes (Signed)
Pt arrived on unit from PACU 0315 hrs, very drowsy, pt able to respond with minor stimulation though alert only to name. Honeycomb dressing to right upper back clean/dry/intact, received report from PACU nurse and implemented new additional transfer orders, Pt has slowly improved responsiveness to time of note and is able to state name, date and time but does continue with some confusion. Pt oriented to room as much as feasible and does exhibit some understanding of instruction, follows commands well. Will continue to monitor

## 2015-02-16 NOTE — Progress Notes (Signed)
No issues overnight. Pt has noted some improvement in strength, and improvement in her chest pain.  EXAM:  BP 105/48 mmHg  Pulse 82  Temp(Src) 98.2 F (36.8 C) (Oral)  Resp 20  Ht '5\' 4"'$  (1.626 m)  Wt 58.968 kg (130 lb)  BMI 22.30 kg/m2  SpO2 100%  Awake, alert, oriented  Speech fluent, appropriate  CN grossly intact  4/5 Bilateral IP 4/5 bilateral KE 5/5 PF/DF Wound c/d/i, no leak  IMPRESSION:  69 y.o. female s/p T5 decompression/stabilization, improvement in strength and pain postop  PLAN: - mobilize with PT

## 2015-02-16 NOTE — Op Note (Signed)
PREOP DIAGNOSIS:  1. Metastatic Tumor to T5 with spinal cord compression 2. Spinal instability   POSTOP DIAGNOSIS: Same  PROCEDURE: 1. T5 laminectomy, bilateral transpedicular decompression of thecal sac 2. T3-T7 non-segmental pedicle screw stabilization  SURGEON: Dr. Consuella Lose, MD  ASSISTANT: Dr. Ashok Pall, MD  ANESTHESIA: General Endotracheal  EBL: 500cc  SPECIMENS: T5 tumor for permanent pathology  DRAINS: none  COMPLICATIONS: None immediate  CONDITION: Hemodynamically stable to PACU  HISTORY: Heather [name] is a 69 y.o. female initially presenting with about 1 week of lower extremity weakness, with essentially inability to walk over the last 36 hours. MRI was done demonstrating tumor infiltration with severe spinal cord compression at T5 area. The patient was also complaining of severe back pain, significantly worse when moving. With these findings, surgical decompression with stabilization was indicated. The risks and benefits of the surgery were explained in detail to the patient and her family. After all questions were answered, informed consent was obtained.  PROCEDURE IN DETAIL: After informed consent was obtained and witnessed, the patient was brought to the operating room. After induction of general anesthesia, the patient was positioned on the operative table in the prone position. All pressure points were meticulously padded. Skin incision was then marked out and prepped and draped in the usual sterile fashion.  After timeout was conducted, AP fluoroscopy was used to easily identify the collapsed T5 vertebral body. Skin incision was then infiltrated with local aesthetic with epinephrine. Skin incision was made sharply, and Bovie electrocautery was used to dissect through subcutaneous tissue. The spinous process at T5 was then identified, and subperiosteal dissection was carried out and self-retaining retractor placed. The base of the spinous process and lamina  on both sides was noted to be discolored, with only a thin shell of cortical bone remaining, and the remainder of the bone replaced with tumor. Rongeurs were used to remove the spinous process and portions of the lamina. Tumor was identified, and a partial superior C6 laminectomy was completed to identify normal dura. The dura was then dissected superiorly, and tumor dorsal and lateral to the spinal cord was easily removed. This was sent for permanent pathology.  At this point, using multiple curettes and Rogers, the pedicle at T5 was removed easily, as it was infiltrated with tumor. In dissection around the pars on the right side, the right T6 nerve root was accidentally ligated. Spinal fluid was seen, but stopped spontaneously.  At this point, using a combination of right angled curettes, rongeurs, and dissectors, good ventral decompression of the thecal sac was achieved. A ball tip dissector was passed ventral to the thecal sac to confirm good decompression.  At this point, having completed the decompression, the superior portion of the lamina and transverse process at T6 was dissected. Entry point for T6 pedicle screws bilaterally were then identified, gearshift was used to create a pilot hole, and 4.5 x 35 mm pedicle screws were placed.  Under AP fluoroscopy, Jamshidi needles were used to cannulate bilateral T7 pedicles. K wires were then placed, and 4.5 x 35 mm pedicle screws were placed. In a similar fashion, Jamshidi needles were used to cannulate the T3 and T4 pedicles. Under lateral fluoroscopy, K wires were placed, and 4.5 x 30 mm pedicle screws were placed at both levels.  At this point, 120 mm pre-bent kyphotic rod was selected, and passed through the pedicle screws including the open screw. Setscrews were then placed, and final tightened. Final AP and lateral fluoroscopic images demonstrated  good position of the hardware.  The thecal sac was then covered with a small piece of DuraGen, and  DuraSeal. The wound is then irrigated with copious amounts of bacitracin irrigation. The fascial incisions were then closed using interrupted 0 Vicryl stitches. The remainder of the wound was then closed in layers using interrupted 0 and 3-0 Vicryl stitches. Skin was covered with Dermabond. Sterile dressing was applied.  At the end of the case, all sponge, needle, instrument, and cottonoid counts were correct. The patient was then extubated, transferred to the stretcher, and taken to the postanesthesia care unit in stable hemodynamic condition.

## 2015-02-16 NOTE — Anesthesia Postprocedure Evaluation (Signed)
  Anesthesia Post-op Note  Patient: Heather [name]  Procedure(s) Performed: Procedure(s): DECOMPRESSION T5 AND T3-T7 STABALIZATION (N/A)  Patient Location: PACU  Anesthesia Type: No value filed.   Level of Consciousness: awake, alert  and oriented  Airway and Oxygen Therapy: Patient Spontanous Breathing  Post-op Pain: mild  Post-op Assessment: Post-op Vital signs reviewed  Post-op Vital Signs: Reviewed  Last Vitals:  Filed Vitals:   02/16/15 0315  BP: 137/67  Pulse: 114  Temp: 36.4 C  Resp: 15    Complications: No apparent anesthesia complications

## 2015-02-16 NOTE — Consult Note (Signed)
[place] CONSULT NOTE  Patient Care Team: [name], MD as PCP - General (Family Medicine)  CHIEF COMPLAINTS/PURPOSE OF CONSULTATION:  T 5 cord compression  HISTORY OF PRESENTING ILLNESS:  Heather [name] 69 y.o. female is admitted to the hospital because of lower extremity paralysis and sensory deficit below the chest. Her symptoms started several months ago with complaints of epigastric abdominal pain which was initially treated with proton pump inhibitors without any benefit. She was later tested to be H. pylori positive and was treated with antibiotics for that. When her symptoms did not get better she started noticing some tingling of the lower extremities. Which very rapidly over the next 2-3 days progressed into complete paralysis of the lower legs. She was examined in the emergency room and MRI of her spine was obtained which showed T5 cord compression from pathologic compression fracture in addition to tumor compromising the spinal cord. There was additional lytic destructive changes at T4 vertebra as well as portion of the T6 vertebra. She emergently underwent decompression laminectomy with stabilization of T3-T7 vertebrae. Immediately she noticed improvement in neurological function in her lower extremity as well as reduction in the pain.  Patient has a previous history of right-sided breast cancer treated with mastectomy. She had 2 different subsets of breast cancer. One was HER-2 positive PR positive and the other one was HER-2 negative ER positive. She was treated with adjuvant chemotherapy with [facility] followed by Herceptin maintenance for 1 year. Subsequently she received radiation as well as anti-estrogen therapy with letrozole since October 2014. I had previously seen her in January 2016 and at that time she was having left hip pain. She reports that with some manipulation the left hip pain had improved.   I reviewed her records extensively and collaborated the history  with the patient.  SUMMARY OF ONCOLOGIC HISTORY:   Primary cancer of upper outer quadrant of right female breast ([place])   03/08/2012 Initial Diagnosis Right breast invasive ductal carcinoma ER positive PR negative HER-2 positive Ki-67 44%; another breast mass biopsied in the anterior part of the breast which was also positive for malignancy that was HER-2 negative   05/10/2012 Surgery Right mastectomy and axillary lymph node dissection: Multifocal disease 5 cm, 1.7 cm, 1.6 cm, ER positive PR negative HER-2 positive Ki-67 44%, 3/7 lymph nodes positive   06/14/2012 - 06/12/2013 Chemotherapy Adjuvant chemotherapy with [place] 6 followed by Herceptin maintenance   10/25/2012 - 12/11/2012 Radiation Therapy Adjuvant radiation therapy   02/07/2013 -  Anti-estrogen oral therapy Letrozole 2.5 mg daily    MEDICAL HISTORY:  Past Medical History  Diagnosis Date  . Chronic kidney disease   . Hx: UTI (urinary tract infection)   . Right shoulder pain   . Right knee pain   . Hypertension     recently started Aldactone   . Hyperlipidemia     but not on meds;diet and exercise controlled  . Dizziness     has been going on for 19month;medical MD aware  . H/O hiatal hernia   . GERD (gastroesophageal reflux disease)     Tums prn  . Hemorrhoids   . Urinary frequency     d/t taking Aldactone  . History of UTI   . Anemia     hx of  . Cataract     immature-not sure of which eye  . Anxiety     r/t updated surgery  . Insomnia     takes Melatoniin daily  . Complication  of anesthesia     pt states she is sensitive to meds  . Breast cancer ([place])     right  . Skin cancer   . Hx of radiation therapy 10/25/12- 12/11/12    right chest wall/regional lymph nodes 5040 cGy, 28 sessions, right chest wall boost 1000 cGy 1 session  . H. pylori infection     SURGICAL HISTORY: Past Surgical History  Procedure Laterality Date  . Cyst removed from left breast  1970  . Left wrist surgery   2004    with plate  . Colonoscopy     . Esophagogastroduodenoscopy    . Mastectomy, radical    . Mastectomy modified radical  05/10/2012    Procedure: MASTECTOMY MODIFIED RADICAL;  Surgeon: Merrie Roof, MD;  Location: [facility];  Service: General;  Laterality: Right;  RIGHT MODIFIED RADICAL MASTECTOMY  . Portacath placement  05/10/2012    Procedure: INSERTION PORT-A-CATH;  Surgeon: Merrie Roof, MD;  Location: [facility];  Service: General;  Laterality: Left;  . Decompressive lumbar laminectomy level 4 N/A 02/15/2015    Procedure: DECOMPRESSION T5 AND T3-T7 STABALIZATION;  Surgeon: Consuella Lose, MD;  Location: [facility];  Service: Neurosurgery;  Laterality: N/A;    SOCIAL HISTORY: Social History   Social History  . Marital Status: Married    Spouse Name: N/A  . Number of Children: 2  . Years of Education: N/A   Occupational History  . Not on file.   Social History Main Topics  . Smoking status: Never Smoker   . Smokeless tobacco: Never Used  . Alcohol Use: No  . Drug Use: No  . Sexual Activity: Yes    Birth Control/ Protection: Post-menopausal   Other Topics Concern  . Not on file   Social History Narrative    FAMILY HISTORY: Family History  Problem Relation Age of Onset  . Leukemia Mother   . Colon cancer Brother   . Cancer Brother     Prostate  . Colon cancer Maternal Grandmother   . Stroke Father   . Diabetes Mellitus II Father     ALLERGIES:  is allergic to codeine; keflex; naprosyn; and tussin.  MEDICATIONS:  Current Facility-Administered Medications  Medication Dose Route Frequency Provider Last Rate Last Dose  . dexamethasone (DECADRON) injection 4 mg  4 mg Intravenous 4 times per day Consuella Lose, MD   4 mg at 02/16/15 1509  . [START ON 02/17/2015] enoxaparin (LOVENOX) injection 30 mg  30 mg Subcutaneous Q24H Consuella Lose, MD      . famotidine (PEPCID) tablet 20 mg  20 mg Oral BID Reubin Milan, MD   20 mg at 02/16/15 0929  . HYDROmorphone (DILAUDID) injection 0.25-0.5 mg   0.25-0.5 mg Intravenous Q5 min PRN Lorrene Reid, MD   0.5 mg at 02/16/15 0154  . HYDROmorphone (DILAUDID) injection 0.25-0.5 mg  0.25-0.5 mg Intravenous Q5 min PRN Lorrene Reid, MD   0.5 mg at 02/16/15 0242  . HYDROmorphone (DILAUDID) injection 1 mg  1 mg Intravenous Q2H PRN Consuella Lose, MD   1 mg at 02/16/15 0557  . letrozole [facility]) tablet 2.5 mg  2.5 mg Oral Daily Reubin Milan, MD   2.5 mg at 02/16/15 0929  . meperidine (DEMEROL) injection 6.25-12.5 mg  6.25-12.5 mg Intravenous Q5 min PRN Lorrene Reid, MD      . methocarbamol (ROBAXIN) tablet 500 mg  500 mg Oral Q8H PRN Reubin Milan, MD   500 mg at  02/16/15 3557  . ondansetron (ZOFRAN) tablet 4 mg  4 mg Oral Q6H PRN Reubin Milan, MD       Or  . ondansetron [facility]) injection 4 mg  4 mg Intravenous Q6H PRN Reubin Milan, MD      . oxyCODONE-acetaminophen (PERCOCET/ROXICET) 5-325 MG per tablet 1-2 tablet  1-2 tablet Oral Q4H PRN Consuella Lose, MD   2 tablet at 02/16/15 1509  . polyethylene glycol (MIRALAX / GLYCOLAX) packet 17 g  17 g Oral Daily PRN Reubin Milan, MD      . sodium chloride 0.9 % injection 3 mL  3 mL Intravenous Q12H Reubin Milan, MD   3 mL at 02/16/15 0930  . spironolactone (ALDACTONE) tablet 12.5 mg  12.5 mg Oral Daily Reubin Milan, MD   12.5 mg at 02/16/15 0930  . traMADol (ULTRAM) tablet 50 mg  50 mg Oral Q4H PRN Reubin Milan, MD   50 mg at 02/15/15 3220    REVIEW OF SYSTEMS:   Constitutional: Denies fevers, chills or abnormal night sweats Eyes: Denies blurriness of vision, double vision or watery eyes Ears, nose, mouth, throat, and face: Denies mucositis or sore throat Respiratory: Denies cough, dyspnea or wheezes Cardiovascular: Denies palpitation, chest discomfort or lower extremity swelling Gastrointestinal:  Pos for heartburn Skin: Denies abnormal skin rashes Lymphatics: Denies new lymphadenopathy or easy bruising Neurologica Loss of strength and decreased  sensation both lower extremities with inability to walk Psych: Mood is stable, no new changes  Breast: Tenderness in the left breast All other systems were reviewed with the patient and are negative.  PHYSICAL EXAMINATION: ECOG PERFORMANCE STATUS: 3 - Symptomatic, >50% confined to bed  Filed Vitals:   02/16/15 1323  BP: 105/48  Pulse: 82  Temp: 98.2 F (36.8 C)  Resp: 20   Filed Weights   02/14/15 1331  Weight: 130 lb (58.968 kg)    GENERAL:alert, no distress and comfortable SKIN: skin color, texture, turgor are normal, no rashes or significant lesions EYES: normal, conjunctiva are pink and non-injected, sclera clear OROPHARYNX:no exudate, no erythema and lips, buccal mucosa, and tongue normal  NECK: supple, thyroid normal size, non-tender, without nodularity LYMPH:  no palpable lymphadenopathy in the cervical, axillary or inguinal LUNGS: clear to auscultation and percussion with normal breathing effort HEART: regular rate & rhythm and no murmurs and no lower extremity edema ABDOMEN:abdomen soft, non-tender and normal bowel sounds Musculoskeletal:no cyanosis of digits and no clubbing  PSYCH: alert & oriented x 3 with fluent speech NEURO:  lower extremity paralysis and sensory deficit, strength is 3/5, sensory examination has recovered but still feels like tingling and numbness below the rib cage and onto her legs. No plantar reflexes Inability to control her urination.   LABORATORY DATA:  I have reviewed the data as listed Lab Results  Component Value Date   WBC 17.9* 02/16/2015   HGB 11.8* 02/16/2015   HCT 34.9* 02/16/2015   MCV 85.5 02/16/2015   PLT 193 02/16/2015   Lab Results  Component Value Date   NA 139 02/16/2015   K 3.8 02/16/2015   CL 104 02/16/2015   CO2 21* 02/16/2015   ASSESSMENT AND PLAN:  1. D5 cord compression status post decompression laminectomy and stabilization by neurosurgery.  Awaiting pathology report.  Very concerning for metastatic  disease given her stage III lymph node involved breast cancer that was HER-2 positive originally diagnosed in December 2013.  It appears that the original biopsy had 2 different  subtypes of breast cancer one was HER-2 positive the other one was HER-2 negative. Both of the cancers were ER positive and PR negative. We will have to wait to the pathology report comes back to determine if the metastatic disease in the spine is HER-2 positive or negative.  2. We will consult radiation oncology to discuss the role of palliative radiation to the spine. Patient understands that it way have to be a few weeks before she can get radiation  3. Once she gets discharged, she will need a whole body PET CT scan for restaging. I discussed briefly that if the T5 lesion is proven to be metastatic disease, that would signify that she has stage IV breast cancer. She understands that stage IV breast cancer cannot be cured. The goals of treatment would be for palliation and prolongation of life.  We will discuss different treatment options based upon the final pathology report.  All questions were answered. The patient knows to call the clinic with any problems, questions or concerns.    Rulon Eisenmenger, MD 5:05 PM

## 2015-02-16 NOTE — Transfer of Care (Signed)
Immediate Anesthesia Transfer of Care Note  Patient: Heather [name]  Procedure(s) Performed: Procedure(s): DECOMPRESSION T5 AND T3-T7 STABALIZATION (N/A)  Patient Location: PACU  Anesthesia Type:General  Level of Consciousness: awake and confused  Airway & Oxygen Therapy: Patient Spontanous Breathing and Patient connected to nasal cannula oxygen  Post-op Assessment: Report given to RN  Post vital signs: Reviewed and stable  Last Vitals:  Filed Vitals:   02/15/15 2058  BP: 140/65  Pulse: 76  Temp: 36.5 C  Resp: 16    Complications: No apparent anesthesia complications

## 2015-02-17 ENCOUNTER — Ambulatory Visit: Payer: Medicare Other | Admitting: Physical Therapy

## 2015-02-17 DIAGNOSIS — D62 Acute posthemorrhagic anemia: Secondary | ICD-10-CM

## 2015-02-17 DIAGNOSIS — G8918 Other acute postprocedural pain: Secondary | ICD-10-CM

## 2015-02-17 DIAGNOSIS — R269 Unspecified abnormalities of gait and mobility: Secondary | ICD-10-CM

## 2015-02-17 DIAGNOSIS — C50911 Malignant neoplasm of unspecified site of right female breast: Secondary | ICD-10-CM

## 2015-02-17 DIAGNOSIS — S24152A Other incomplete lesion at T2-T6 level of thoracic spinal cord, initial encounter: Secondary | ICD-10-CM

## 2015-02-17 LAB — CBC WITH DIFFERENTIAL/PLATELET
Basophils Absolute: 0 10*3/uL (ref 0.0–0.1)
Basophils Relative: 0 %
Eosinophils Absolute: 0 10*3/uL (ref 0.0–0.7)
Eosinophils Relative: 0 %
HCT: 28.8 % — ABNORMAL LOW (ref 36.0–46.0)
Hemoglobin: 9.5 g/dL — ABNORMAL LOW (ref 12.0–15.0)
Lymphocytes Relative: 6 %
Lymphs Abs: 0.5 10*3/uL — ABNORMAL LOW (ref 0.7–4.0)
MCH: 28.7 pg (ref 26.0–34.0)
MCHC: 33 g/dL (ref 30.0–36.0)
MCV: 87 fL (ref 78.0–100.0)
Monocytes Absolute: 0.4 10*3/uL (ref 0.1–1.0)
Monocytes Relative: 5 %
Neutro Abs: 7.2 10*3/uL (ref 1.7–7.7)
Neutrophils Relative %: 89 %
Platelets: 187 10*3/uL (ref 150–400)
RBC: 3.31 MIL/uL — ABNORMAL LOW (ref 3.87–5.11)
RDW: 12.9 % (ref 11.5–15.5)
WBC: 8.2 10*3/uL (ref 4.0–10.5)

## 2015-02-17 LAB — COMPREHENSIVE METABOLIC PANEL WITH GFR
ALT: 29 U/L (ref 14–54)
AST: 47 U/L — ABNORMAL HIGH (ref 15–41)
Albumin: 3 g/dL — ABNORMAL LOW (ref 3.5–5.0)
Alkaline Phosphatase: 60 U/L (ref 38–126)
Anion gap: 7 (ref 5–15)
BUN: 14 mg/dL (ref 6–20)
CO2: 28 mmol/L (ref 22–32)
Calcium: 9 mg/dL (ref 8.9–10.3)
Chloride: 98 mmol/L — ABNORMAL LOW (ref 101–111)
Creatinine, Ser: 0.91 mg/dL (ref 0.44–1.00)
GFR calc Af Amer: >60 mL/min
GFR calc non Af Amer: >60 mL/min
Glucose, Bld: 158 mg/dL — ABNORMAL HIGH (ref 65–99)
Potassium: 4.8 mmol/L (ref 3.5–5.1)
Sodium: 133 mmol/L — ABNORMAL LOW (ref 135–145)
Total Bilirubin: 0.5 mg/dL (ref 0.3–1.2)
Total Protein: 5.7 g/dL — ABNORMAL LOW (ref 6.5–8.1)

## 2015-02-17 LAB — RETICULOCYTES
RBC.: 3.61 MIL/uL — ABNORMAL LOW (ref 3.87–5.11)
Retic Count, Absolute: 50.5 10*3/uL (ref 19.0–186.0)
Retic Ct Pct: 1.4 % (ref 0.4–3.1)

## 2015-02-17 LAB — FERRITIN: Ferritin: 65 ng/mL (ref 11–307)

## 2015-02-17 LAB — IRON AND TIBC
Iron: 29 ug/dL (ref 28–170)
Saturation Ratios: 11 % (ref 10.4–31.8)
TIBC: 267 ug/dL (ref 250–450)
UIBC: 238 ug/dL

## 2015-02-17 LAB — VITAMIN B12: Vitamin B-12: 615 pg/mL (ref 180–914)

## 2015-02-17 LAB — FOLATE: Folate: 20.4 ng/mL (ref >5.9)

## 2015-02-17 NOTE — Progress Notes (Signed)
Inpatient Rehabilitation  I met with the patient and her husband at the bedside to discuss possible CIR admission when medically ready.  I explained about the program and provided informational booklets.  I answered their questions. Pt. And spouse agree that they want CIR for her post acute rehab recovery.  I will follow up tomorrow for medical readiness and tentative admission if MDs agreeable.    Please call if questions.  [place] Admissions Coordinator Cell [phone] Office [phone]

## 2015-02-17 NOTE — Progress Notes (Signed)
No issues overnight. Back pain controlled.  EXAM:  BP 106/47 mmHg  Pulse 87  Temp(Src) 98.3 F (36.8 C) (Oral)  Resp 20  Ht '5\' 4"'$  (1.626 m)  Wt 58.968 kg (130 lb)  BMI 22.30 kg/m2  SpO2 100%  Awake, alert, oriented  Speech fluent, appropriate  CN grossly intact  4/5 proximal BLE, good strength distal BLE Wound c/d/i, no leak  IMPRESSION:  69 y.o. female s/p T5 decompression/stabilization.  Path pending  PLAN: - Cont to mobilize with PT/OT, likely CIR - Would be ready for CIR placement possibly tomorrow from surgical standpoint.  - Depending on path, will likely need rad onc for T5 radiation ?SBRT v conventional radiation

## 2015-02-17 NOTE — Progress Notes (Signed)
Rehab Admissions Coordinator Note:  Patient was screened by Cleatrice Burke for appropriateness for an Inpatient Acute Rehab Consult per PT recommendation.   At this time, we are recommending Inpatient Rehab consult. Discussed with Dr. Grandville Silos yesterday. I will place order.   Cleatrice Burke 02/17/2015, 9:45 AM  I can be reached at [phone].

## 2015-02-17 NOTE — Evaluation (Signed)
Occupational Therapy Evaluation Patient Details Name: Heather [name] MRN: [id number] DOB: [birth year] Today's Date: 02/17/2015    History of Present Illness Pt is a 69 y.o. female with a PMH significant for breast CA s/p mastectomy. Pt presents with LE paralysis and sensory deficit below her chest. Symptoms began several months ago with rapid onset ~4 days PTA. MRI revealed T5 cord compression from pathologic compression fracture in addition to tumor compromising the spinal cord. Pt emergently underwent decompression laminectomy with stabilization of T3-T7 vertebrae on 10/10.    Clinical Impression   Pt s/p above. Pt independent with ADLs, prior to 4 days before admission. Feel pt will benefit from acute OT to increase independence prior to d/c. Recommending CIR and feel pt is a great candidate.     Follow Up Recommendations  CIR    Equipment Recommendations  Other (comment) (defer to next venue)    Recommendations for Other Services       Precautions / Restrictions Precautions Precautions: Fall;Back Precaution Booklet Issued: No Precaution Comments: Discussed back precautions with pt Restrictions Weight Bearing Restrictions: No      Mobility Bed Mobility Overal bed mobility: Needs Assistance Bed Mobility: Rolling;Sidelying to Sit Rolling: Supervision Sidelying to sit: Min guard       General bed mobility comments:  Pt was able to demonstrate log roll technique with close guard for safety as pt elevated trunk to full sitting position. VC's throughout transfer for sequencing and technique.   Transfers Overall transfer level: Needs assistance Equipment used: Rolling walker (2 wheeled) Transfers: Sit to/from Stand Sit to Stand: Min assist;+2 safety/equipment         General transfer comment: Pt was able to power-up to full standing position with assist for balance/support. +2 available for safety.     Balance Overall balance assessment: Needs  assistance Sitting-balance support: Feet supported;No upper extremity supported Sitting balance-Leahy Scale: Fair     Standing balance support: Bilateral upper extremity supported;During functional activity Standing balance-Leahy Scale: Poor                              ADL Overall ADL's : Needs assistance/impaired                 Upper Body Dressing : Set up;Supervision/safety;Sitting   Lower Body Dressing: Sit to/from stand;With adaptive equipment;Moderate assistance   Toilet Transfer: +2 for safety/equipment;Minimal assistance;Ambulation;RW (sit to stand from bed)             General ADL Comments: Educated on AE and discussed LB dressing technique.      Vision     Perception     Praxis      Pertinent Vitals/Pain Pain Assessment: 0-10 Pain Score: 3  Pain Location: back-incision site Pain Descriptors / Indicators: Operative site guarding Pain Intervention(s): Monitored during session;Repositioned     Hand Dominance Right   Extremity/Trunk Assessment Upper Extremity Assessment Upper Extremity Assessment: Overall WFL for tasks assessed   Lower Extremity Assessment Lower Extremity Assessment: Defer to PT evaluation RLE Deficits / Details: Decreased strength greater than L RLE Sensation: decreased light touch;decreased proprioception RLE Coordination: decreased fine motor;decreased gross motor (Heel to shin coordination less than L side) LLE Deficits / Details: Decreased strength, less than R LLE Sensation: decreased light touch;decreased proprioception LLE Coordination: decreased fine motor;decreased gross motor (Heel to shin coordination greater than R side)   Cervical / Trunk Assessment Cervical / Trunk Assessment: Other exceptions Cervical /  Trunk Exceptions: forward head/rounded shoulders   Communication Communication Communication: No difficulties   Cognition Arousal/Alertness: Awake/alert Behavior During Therapy: WFL for tasks  assessed/performed Overall Cognitive Status: Within Functional Limits for tasks assessed                     General Comments       Exercises       Shoulder Instructions      Home Living Family/patient expects to be discharged to:: Private residence Living Arrangements: Spouse/significant other Available Help at Discharge: Family;Available 24 hours/day Type of Home: House Home Access: Stairs to enter [employer] of Steps: 1 Entrance Stairs-Rails: None Home Layout: Able to live on main level with bedroom/bathroom;Two level     Bathroom Shower/Tub: Astronomer Accessibility: Yes   Home Equipment: Environmental consultant - 2 wheels;Bedside commode;Shower seat          Prior Functioning/Environment Level of Independence: Independent with assistive device(s)        Comments: Independent until last 4 days prior to admission;Using the RW last 4 days prior to surgery     OT Diagnosis: Acute pain;Generalized weakness   OT Problem List: Decreased strength;Decreased coordination;Decreased knowledge of use of DME or AE;Decreased knowledge of precautions;Pain;Decreased activity tolerance;Impaired balance (sitting and/or standing)   OT Treatment/Interventions: Self-care/ADL training;DME and/or AE instruction;Therapeutic activities;Patient/family education;Balance training    OT Goals(Current goals can be found in the care plan section) Acute Rehab OT Goals Patient Stated Goal: evident pt wants to be independent OT Goal Formulation: With patient Time For Goal Achievement: 02/24/15 Potential to Achieve Goals: Good ADL Goals Pt Will Perform Lower Body Bathing: with min guard assist;sit to/from stand;with adaptive equipment Pt Will Perform Lower Body Dressing: with min guard assist;sit to/from stand;with adaptive equipment Pt Will Transfer to Toilet: with min guard assist;ambulating Pt Will Perform Toileting - Clothing Manipulation and hygiene: with min guard  assist;sit to/from stand  OT Frequency: Min 2X/week   Barriers to D/C:            Co-evaluation PT/OT/SLP Co-Evaluation/Treatment: Yes Reason for Co-Treatment: For patient/therapist safety PT goals addressed during session: Mobility/safety with mobility;Balance;Proper use of DME OT goals addressed during session: ADL's and self-care;Other (comment) (mobility)      End of Session Equipment Utilized During Treatment: Gait belt;Rolling walker (Oxygen placed back on towards end of session) Nurse Communication: Mobility status (spoke to nursing student)  Activity Tolerance: Patient tolerated treatment well Patient left: in chair;with call bell/phone within reach   Time: 7289-7915 OT Time Calculation (min): 29 min Charges:  OT General Charges $OT Visit: 1 Procedure OT Evaluation $Initial OT Evaluation Tier I: 1 Procedure G-CodesBenito Mccreedy OTR/L [id number] 02/17/2015, 10:09 AM

## 2015-02-17 NOTE — Care Management Note (Signed)
Case Management Note  Patient Details  Name: Heather [name] MRN: [id number] Date of Birth: [birth year]  Subjective/Objective:                    Action/Plan: Patient admitted with spinal cord compression due to malignant neoplasm and mets to the spine. Pt lives at home with her spouse. PT/OT recommending CIR. CM will continue to follow for discharge needs.  Expected Discharge Date:                  Expected Discharge Plan:  Home/Self Care  In-House Referral:     Discharge planning Services     Post Acute Care Choice:    Choice offered to:     DME Arranged:    DME Agency:     HH Arranged:    HH Agency:     Status of Service:  In process, will continue to follow  Medicare Important Message Given:  Yes-second notification given Date Medicare IM Given:    Medicare IM give by:    Date Additional Medicare IM Given:    Additional Medicare Important Message give by:     If discussed at [place] of Stay Meetings, dates discussed:    Additional Comments:  Pollie Friar, RN 02/17/2015, 3:13 PM

## 2015-02-17 NOTE — Care Management Important Message (Signed)
Important Message  Patient Details  Name: Heather [name] MRN: [id number] Date of Birth: 21-Nov-1945   Medicare Important Message Given:  Yes-second notification given    Delorse Lek 02/17/2015, 11:20 AM

## 2015-02-17 NOTE — Progress Notes (Signed)
[facility] PROGRESS NOTE  Heather [name] NKN:[id number] DOB: 06-11-1945 DOA: 02/14/2015 PCP: Vidal Schwalbe, MD  Assessment/Plan: #1 spinal cord compression with myelopathy and inability to walk/epidural tumor Per MRI of the T-spine. CT of the T-spine was ordered by neurosurgery. Patient s/p operative decompression/separation at T5 and stabilization from T3-T7 per neurosurgery 02/16/2015. Continue Decadron. Probably transition IV Decadron to oral Decadron tomorrow. Patient has been seen in consultation by oncology who is concerned of metastatic disease given his stage III lymph node involved breast cancer now was HER-2 positive. Patient original biopsy had 2 different subtypes of breast cancer 1 was HER-2 positive and the other one was HER-2 negative with of which were ER positive and PR negative. Pathology pending. Per oncology on follow-up patient would likely need outpatient whole body PET CT scan for restaging and probable radiation treatments. Patient postoperatively with clinical improvement. PT/OT. Patient likely to be discharged to the inpatient rehabilitation service when okay with neurosurgery. Neurosurgery following and appreciate input and recommendations.   #2 pathologic fracture of the T-spine Secondary to problem #1. Per neurosurgery.  #3 postop acute blood loss anemia She is hemoglobin currently at 9.5 from 11.8. Patient with no overt bleeding. Anemia panel consistent with anemia of chronic disease. Follow H&H. Transfusion threshold hemoglobin less than 7.  #4 hypertension Stable. Continue home regimen of Aldactone.  #5 history of breast cancer Status post right mastectomy being followed by oncology. Continue femara. Oncology, [name] has consulted on the patient.   #6 hyperlipidemia Continue statin.  #7 hypokalemia Repleted.  #8 prophylaxis Pepcid for GI prophylaxis. Lovenox for DVT prophylaxis.  Code Status: Full Family Communication: Updated patient and  family at bedside. Disposition Plan: To inpatient rehabilitation tomorrow if hemoglobin stable and okay with neurosurgery.   Consultants:  Neurosurgery: Dr. Kathyrn Sheriff 02/15/2015  Oncology: [name] 02/16/2015  Procedures:  CT head 02/14/2015  CT T-spine 02/15/2015  MRI head 02/14/2015, MRI C, L, T-spine 02/14/2015 1. T5 laminectomy, bilateral transpedicular decompression of thecal sac 2. T3-T7 non-segmental pedicle screw stabilization----per Dr.Nundkumar 02/16/2015   Antibiotics:  None  HPI/Subjective: Patient laying in bed. Patient states back pain and chest pain improved. Patient states some strength in her legs. No shortness of breath. Patient denies any overt bleeding.  Objective: Filed Vitals:   02/17/15 1403  BP: 106/47  Pulse: 87  Temp: 98.3 F (36.8 C)  Resp: 20    Intake/Output Summary (Last 24 hours) at 02/17/15 1643 Last data filed at 02/17/15 0830  Gross per 24 hour  Intake    243 ml  Output   2850 ml  Net  -2607 ml   Filed Weights   02/14/15 1331  Weight: 58.968 kg (130 lb)    Exam:   General:  NAD  Cardiovascular: RRR  Respiratory: CTAB  Abdomen: Soft, nontender, nondistended, positive bowel sounds.  Musculoskeletal: No clubbing cyanosis or edema.  Data Reviewed: Basic Metabolic Panel:  Recent Labs Lab 02/14/15 1309 02/14/15 2335 02/15/15 0325 02/16/15 0528 02/17/15 0441  NA 134*  --  137 139 133*  K 3.2*  --  3.8 3.8 4.8  CL 96*  --  97* 104 98*  CO2 28  --  27 21* 28  GLUCOSE 188*  --  90 204* 158*  BUN 10  --  '10 15 14  ' CREATININE 0.89 1.09* 0.94 1.12* 0.91  CALCIUM 10.2  --  9.8 9.4 9.0  MG  --   --  1.9  --   --   PHOS  --   --  3.7  --   --    Liver Function Tests:  Recent Labs Lab 02/14/15 1309 02/15/15 0325 02/16/15 0528 02/17/15 0441  AST 32 31 54* 47*  ALT '18 19 24 29  ' ALKPHOS 92 86 73 60  BILITOT 0.7 0.6 0.7 0.5  PROT 7.9 5.8* 6.3* 5.7*  ALBUMIN 4.3 3.5 3.0* 3.0*   No results for input(s):  LIPASE, AMYLASE in the last 168 hours. No results for input(s): AMMONIA in the last 168 hours. CBC:  Recent Labs Lab 02/14/15 1309 02/14/15 2335 02/15/15 0325 02/16/15 0528 02/17/15 0441  WBC 7.8 7.7 6.5 17.9* 8.2  NEUTROABS 6.6  --  3.9 16.1* 7.2  HGB 14.3 14.5 13.7 11.8* 9.5*  HCT 40.9 42.4 41.2 34.9* 28.8*  MCV 84.9 85.8 86.6 85.5 87.0  PLT 225 217 207 193 187   Cardiac Enzymes: No results for input(s): CKTOTAL, CKMB, CKMBINDEX, TROPONINI in the last 168 hours. BNP (last 3 results) No results for input(s): BNP in the last 8760 hours.  ProBNP (last 3 results) No results for input(s): PROBNP in the last 8760 hours.  CBG: No results for input(s): GLUCAP in the last 168 hours.  No results found for this or any previous visit (from the past 240 hour(s)).   Studies: Dg Thoracic Spine 2 View  02/16/2015   CLINICAL DATA:  Surgical fixation of thoracic spine  EXAM: OPERATIVE THORACIC SPINE 2 VIEW(S)  COMPARISON:  CT thoracic spine 02/15/2015  FINDINGS: Intraoperative fluoroscopy is utilized for surgical control purposes. Fluoroscopy time is recorded at 2 minutes 10 seconds. Exposure dose is not indicated.  Postoperative changes with posterior rod and screw fixation of the upper thoracic spine bridging the destructive lesion demonstrated at T5 on previous CT scan. Thoracic spine alignment appears grossly intact. Enteric and endotracheal tubes are noted.  IMPRESSION: Intraoperative fluoroscopy utilized for surgical control purposes demonstrating posterior fixation of the upper thoracic spine.   Electronically Signed   By: Lucienne Capers M.D.   On: 02/16/2015 01:22   Dg C-arm Gt 120 Min  02/16/2015   CLINICAL DATA:  Surgical fixation of thoracic spine  EXAM: OPERATIVE THORACIC SPINE 2 VIEW(S)  COMPARISON:  CT thoracic spine 02/15/2015  FINDINGS: Intraoperative fluoroscopy is utilized for surgical control purposes. Fluoroscopy time is recorded at 2 minutes 10 seconds. Exposure dose is  not indicated.  Postoperative changes with posterior rod and screw fixation of the upper thoracic spine bridging the destructive lesion demonstrated at T5 on previous CT scan. Thoracic spine alignment appears grossly intact. Enteric and endotracheal tubes are noted.  IMPRESSION: Intraoperative fluoroscopy utilized for surgical control purposes demonstrating posterior fixation of the upper thoracic spine.   Electronically Signed   By: Lucienne Capers M.D.   On: 02/16/2015 01:22    Scheduled Meds: . dexamethasone  4 mg Intravenous 4 times per day  . enoxaparin (LOVENOX) injection  30 mg Subcutaneous Q24H  . famotidine  20 mg Oral BID  . letrozole  2.5 mg Oral Daily  . sodium chloride  3 mL Intravenous Q12H  . spironolactone  12.5 mg Oral Daily   Continuous Infusions:    Principal Problem:   Spinal cord compression due to malignant neoplasm metastatic to spine [facility]) Active Problems:   Pathologic fracture of thoracic vertebrae   Epidural mass   HTN (hypertension)   Pathologic fracture of vertebra   Breast cancer metastasized to bone [facility])   Hyperlipidemia   Thoracic spine tumor   Postoperative anemia due to acute blood loss  Time spent: 17 minutes    Tyller Bowlby M.D. [facility] Pager [phone]. If 7PM-7AM, please contact night-coverage at [email or web address], password [facility] 02/17/2015, 4:43 PM  LOS: 3 days

## 2015-02-17 NOTE — Evaluation (Signed)
Physical Therapy Evaluation Patient Details Name: Heather [name] MRN: [id number] DOB: 18-Jul-1945 Today's Date: 02/17/2015   History of Present Illness  Pt is a 69 y/o female with a PMH significant for breast CA s/p mastectomy. Pt presents with LE paralysis and sensory deficit below her chest. Symptoms began several months ago with rapid onset ~4 days PTA. MRI revealed T5 cord compression from pathologic compression fracture in addition to tumor compromising the spinal cord. Pt emergently underwent decompression laminectomy with stabilization of T3-T7 vertebrae on 10/10.   Clinical Impression  Pt admitted with above diagnosis. Pt currently with functional limitations due to the deficits listed below (see PT Problem List). At the time of PT eval pt was able to perform transfers and ambulation with overall min assist, with +2 helpful for safety. Pt endorses improvement in symptoms since surgery, however is not independent enough to return home at this time. Demonstrates poor coordination of LE's and gross strength deficits. Feel that this pt is a good candidate for CIR at d/c to improve functional mobility and return to a modified independent level prior to return home with husband. Pt will benefit from skilled PT to increase their independence and safety with mobility to allow discharge to the venue listed below.       Follow Up Recommendations CIR;Supervision/Assistance - 24 hour    Equipment Recommendations  Wheelchair (measurements PT);Wheelchair cushion (measurements PT)    Recommendations for Other Services Rehab consult     Precautions / Restrictions Precautions Precautions: Fall;Back Precaution Comments: Discussed back precautions with pt Restrictions Weight Bearing Restrictions: No      Mobility  Bed Mobility Overal bed mobility: Needs Assistance Bed Mobility: Rolling;Sidelying to Sit Rolling: Supervision Sidelying to sit: Min guard       General bed mobility comments: HOB  flat, use of rails. Pt was able to demonstrate log roll technique with close guard for safety as pt elevated trunk to full sitting position. VC's throughout transfer for sequencing and technique.   Transfers Overall transfer level: Needs assistance Equipment used: Rolling walker (2 wheeled) Transfers: Sit to/from Stand Sit to Stand: Min assist;+2 safety/equipment         General transfer comment: Pt was able to power-up to full standing position with assist for balance/support. +2 available for safety.   Ambulation/Gait Ambulation/Gait assistance: Min assist;+2 safety/equipment Ambulation Distance (Feet): 20 Feet Assistive device: Rolling walker (2 wheeled) Gait Pattern/deviations: Step-to pattern;Decreased stride length;Scissoring;Ataxic;Trunk flexed;Narrow base of support Gait velocity: Decreased Gait velocity interpretation: Below normal speed for age/gender General Gait Details: Pt required +2 assist mainly for safety, and chair follow was utilized. Prior to stepping away from EOB, pt was able to perform pre-gait activity of marching in place and weight shifting to gain confidence. Pt was able to ambulate with assist for balance and support as pt took steps/readjusted foot positioning before advancing other foot. Noted grossly ataxic steps with occasional scissoring and frequent tandem steps.   Stairs            Wheelchair Mobility    Modified Rankin (Stroke Patients Only)       Balance Overall balance assessment: Needs assistance Sitting-balance support: Feet supported;No upper extremity supported Sitting balance-Leahy Scale: Fair     Standing balance support: Bilateral upper extremity supported;During functional activity Standing balance-Leahy Scale: Poor                               Pertinent Vitals/Pain Pain  Assessment: 0-10 Pain Score: 3  Pain Location: Back - incision site Pain Descriptors / Indicators: Operative site guarding Pain  Intervention(s): Limited activity within patient's tolerance;Monitored during session;Repositioned    Home Living Family/patient expects to be discharged to:: Private residence Living Arrangements: Spouse/significant other Available Help at Discharge: Family;Available 24 hours/day Type of Home: House Home Access: Stairs to enter Entrance Stairs-Rails: None Entrance Stairs-Number of Steps: 1 Home Layout: Able to live on main level with bedroom/bathroom;Two level Home Equipment: Walker - 2 wheels;Bedside commode;Shower seat      Prior Function Level of Independence: Independent with assistive device(s)         Comments: Using the RW last 4 days prior to surgery     Hand Dominance   Dominant Hand: Right    Extremity/Trunk Assessment   Upper Extremity Assessment: Defer to OT evaluation           Lower Extremity Assessment: RLE deficits/detail;LLE deficits/detail RLE Deficits / Details: Decreased strength greater than L LLE Deficits / Details: Decreased strength, less than R  Cervical / Trunk Assessment: Other exceptions  Communication   Communication: No difficulties  Cognition Arousal/Alertness: Awake/alert Behavior During Therapy: WFL for tasks assessed/performed Overall Cognitive Status: Within Functional Limits for tasks assessed                      General Comments      Exercises        Assessment/Plan    PT Assessment Patient needs continued PT services  PT Diagnosis Difficulty walking;Abnormality of gait   PT Problem List Decreased strength;Decreased range of motion;Decreased activity tolerance;Decreased balance;Decreased coordination;Decreased mobility;Decreased knowledge of use of DME;Decreased safety awareness;Decreased knowledge of precautions;Pain  PT Treatment Interventions DME instruction;Gait training;Stair training;Functional mobility training;Therapeutic activities;Therapeutic exercise;Neuromuscular re-education;Patient/family  education   PT Goals (Current goals can be found in the Care Plan section) Acute Rehab PT Goals Patient Stated Goal: Return to PLOF PT Goal Formulation: With patient Time For Goal Achievement: 02/24/15 Potential to Achieve Goals: Good    Frequency Min 4X/week   Barriers to discharge        Co-evaluation PT/OT/SLP Co-Evaluation/Treatment: Yes Reason for Co-Treatment: Complexity of the patient's impairments (multi-system involvement);For patient/therapist safety PT goals addressed during session: Mobility/safety with mobility;Balance;Proper use of DME         End of Session Equipment Utilized During Treatment: Gait belt Activity Tolerance: Patient limited by fatigue Patient left: in chair;with call bell/phone within reach Nurse Communication: Mobility status;Other (comment) (Nursing student)         Time: 0630-1601 PT Time Calculation (min) (ACUTE ONLY): 31 min   Charges:   PT Evaluation $Initial PT Evaluation Tier I: 1 Procedure     PT G Codes:        Rolinda Roan Mar 11, 2015, 9:35 AM   Rolinda Roan, PT, DPT Acute Rehabilitation Services Pager: [phone]

## 2015-02-17 NOTE — Consult Note (Signed)
Physical Medicine and Rehabilitation Consult Reason for Consult: T5 cord compression/myelopathy from pathologic compression fracture Referring Physician: [facility]   HPI: Heather [name] is a 69 y.o. right handed female with history of breast cancer status post mastectomy, lymph node dissection with 28 sessions radiation and chemotherapy therapy 10/25/2012-12/11/2012 followed by [name]. Patient lives with spouse 2 level home with bedroom downstairs one step to entry. Independent with assistive device prior to admission that she been using for the past few days. Presented 02/14/2015 with bilateral lower extremity weakness and numbness around the chest level down 1 week with pain wrapping around the chest and back. MRI imaging revealed infiltration of the T5 vertebrae involving both body and posterior elements causing significant canal compromise and spinal cord compression/myelopathy. Kyphosis at this level due to pathologic fracture of T5 body. Underwent T5 laminectomy, bilateral transpedicular decompression of thecal sac, T3-T7 nonsegmental pedicle screw stabilization 02/15/2015 per Dr. Kathyrn Sheriff. Hospital course complicated by pain management, hyponatremia, transaminitis, ABLA . Subcutaneous Lovenox for DVT prophylaxis. Acute blood loss anemia 9.5 and monitored. Physical therapy evaluation completed 02/17/2015 with recommendations of physical medicine rehabilitation consult. Pt lives in a 1 story home with her husband, who is able to provide superversion, but only partial physical support.  He has had therapy before when she was not able to use her hand and had great results, so she hoping to gain the same benefit with IPR.    Review of Systems  Constitutional: Negative for fever and chills.  HENT: Negative for hearing loss.   Eyes: Negative for blurred vision and double vision.  Respiratory: Negative for cough and shortness of breath.   Cardiovascular: Negative for chest pain, palpitations  and leg swelling.  Gastrointestinal: Positive for abdominal pain and constipation. Negative for nausea and vomiting.       GERD  Genitourinary: Positive for frequency. Negative for dysuria and hematuria.  Musculoskeletal: Positive for myalgias, back pain and joint pain.       Chest wall pain  Skin: Negative for rash.  Neurological: Positive for dizziness, sensory change (chest caudally; parasthesias) and weakness. Negative for headaches.  Psychiatric/Behavioral: The patient has insomnia.        Anxiety  All other systems reviewed and are negative.  Past Medical History  Diagnosis Date  . Chronic kidney disease   . Hx: UTI (urinary tract infection)   . Right shoulder pain   . Right knee pain   . Hypertension     recently started Aldactone   . Hyperlipidemia     but not on meds;diet and exercise controlled  . Dizziness     has been going on for 67month;medical MD aware  . H/O hiatal hernia   . GERD (gastroesophageal reflux disease)     Tums prn  . Hemorrhoids   . Urinary frequency     d/t taking Aldactone  . History of UTI   . Anemia     hx of  . Cataract     immature-not sure of which eye  . Anxiety     r/t updated surgery  . Insomnia     takes Melatoniin daily  . Complication of anesthesia     pt states she is sensitive to meds  [place]  . Skin cancer   . Hx of radiation therapy 10/25/12- 12/11/12    right chest wall/regional lymph nodes 5040 cGy, 28 sessions, right chest wall boost 1000 cGy 1 session  .  H. pylori infection    Past Surgical History  Procedure Laterality Date  . Cyst removed from left breast  1970  . Left wrist surgery   2004    with plate  . Colonoscopy    . Esophagogastroduodenoscopy    . Mastectomy, radical    . Mastectomy modified radical  05/10/2012    Procedure: MASTECTOMY MODIFIED RADICAL;  Surgeon: Merrie Roof, MD;  Location: [place];  Service: General;  Laterality: Right;  RIGHT MODIFIED RADICAL MASTECTOMY  .  Portacath placement  05/10/2012    Procedure: INSERTION PORT-A-CATH;  Surgeon: Merrie Roof, MD;  Location: [place];  Service: General;  Laterality: Left;  . Decompressive lumbar laminectomy level 4 N/A 02/15/2015    Procedure: DECOMPRESSION T5 AND T3-T7 STABALIZATION;  Surgeon: Consuella Lose, MD;  Location: [place] [facility];  Service: Neurosurgery;  Laterality: N/A;   Family History  Problem Relation Age of Onset  . Leukemia Mother   . Colon cancer Brother   . Cancer Brother     Prostate  . Colon cancer Maternal Grandmother   . Stroke Father   . Diabetes Mellitus II Father    Social History:  reports that she has never smoked. She has never used smokeless tobacco. She reports that she does not drink alcohol or use illicit drugs. Allergies:  Allergies  Allergen Reactions  . Codeine     " loopy"  . Keflex [Cephalexin]     GI Upset  . Naprosyn [Naproxen] Other (See Comments)    GI ipset   . Tussin [Guaifenesin] Other (See Comments)    Dizzy    Medications Prior to Admission  Medication Sig Dispense Refill  . B Complex-C (SUPER B COMPLEX PO) Take 1 each by mouth daily.    Marland Kitchen letrozole (FEMARA) 2.5 MG tablet Take 1 tablet (2.5 mg total) by mouth daily. 90 tablet 3  . methocarbamol (ROBAXIN) 500 MG tablet Take 500 mg by mouth daily as needed for muscle spasms.    . polyethylene glycol powder (GLYCOLAX/MIRALAX) powder Take 1 Container by mouth daily as needed for mild constipation.    . Probiotic Product (PROBIOTIC DAILY PO) Take 1 capsule by mouth daily.     . ranitidine (ZANTAC) 300 MG tablet Take 300 mg by mouth daily.    Marland Kitchen spironolactone (ALDACTONE) 25 MG tablet Take 0.5 tablets (12.5 mg total) by mouth daily. 45 tablet 12  . traMADol (ULTRAM) 50 MG tablet Take 50 mg by mouth every 6 (six) hours as needed for moderate pain.    Marland Kitchen UNABLE TO FIND Rx: L8000- Post Surgical Bra (Quantity: 6) G2694- Silicone Breast Prosthesis (Quantity: 1) Dx: 174.9; Right mastectomy 1 each 0  .  furosemide (LASIX) 20 MG tablet Take 1 tablet (20 mg total) by mouth daily as needed. (Patient not taking: Reported on 01/11/2015) 30 tablet 3  . pravastatin (PRAVACHOL) 10 MG tablet Take 10 mg by mouth daily.       Home: Home Living Family/patient expects to be discharged to:: Private residence Living Arrangements: Spouse/significant other Available Help at Discharge: Family, Available 24 hours/day Type of Home: House Home Access: Stairs to enter [employer] of Steps: 1 Entrance Stairs-Rails: None Home Layout: Able to live on main level with bedroom/bathroom, Two level Bathroom Shower/Tub: Holiday representative Accessibility: Yes Home Equipment: Environmental consultant - 2 wheels, Bedside commode, Shower seat  Functional History: Prior Function Level of Independence: Independent with assistive device(s) Comments: Independent until last 4 days prior to admission;Using  the RW last 4 days prior to surgery  Functional Status:  Mobility: Bed Mobility Overal bed mobility: Needs Assistance Bed Mobility: Rolling, Sidelying to Sit Rolling: Supervision Sidelying to sit: Min guard General bed mobility comments:  Pt was able to demonstrate log roll technique with close guard for safety as pt elevated trunk to full sitting position. VC's throughout transfer for sequencing and technique.  Transfers Overall transfer level: Needs assistance Equipment used: Rolling walker (2 wheeled) Transfers: Sit to/from Stand Sit to Stand: Min assist, +2 safety/equipment General transfer comment: Pt was able to power-up to full standing position with assist for balance/support. +2 available for safety.  Ambulation/Gait Ambulation/Gait assistance: Min assist, +2 safety/equipment Ambulation Distance (Feet): 20 Feet Assistive device: Rolling walker (2 wheeled) Gait Pattern/deviations: Step-to pattern, Decreased stride length, Scissoring, Ataxic, Trunk flexed, Narrow base of support General Gait Details: Pt required  +2 assist mainly for safety, and chair follow was utilized. Prior to stepping away from EOB, pt was able to perform pre-gait activity of marching in place and weight shifting to gain confidence. Pt was able to ambulate with assist for balance and support as pt took steps/readjusted foot positioning before advancing other foot. Noted grossly ataxic steps with occasional scissoring and frequent tandem steps.  Gait velocity: Decreased Gait velocity interpretation: Below normal speed for age/gender    ADL: ADL Overall ADL's : Needs assistance/impaired Upper Body Dressing : Set up, Supervision/safety, Sitting Lower Body Dressing: Sit to/from stand, With adaptive equipment, Moderate assistance Toilet Transfer: +2 for safety/equipment, Minimal assistance, Ambulation, RW (sit to stand from bed) General ADL Comments: Educated on AE and discussed LB dressing technique.   Cognition: Cognition Overall Cognitive Status: Within Functional Limits for tasks assessed Orientation Level: Oriented X4 Cognition Arousal/Alertness: Awake/alert Behavior During Therapy: WFL for tasks assessed/performed Overall Cognitive Status: Within Functional Limits for tasks assessed  Blood pressure 126/48, pulse 90, temperature 98.1 F (36.7 C), temperature source Oral, resp. rate 18, height '5\' 4"'$  (1.626 m), weight 58.968 kg (130 lb), SpO2 100 %. Physical Exam  Vitals reviewed. Constitutional: She is oriented to person, place, and time. She appears well-developed and well-nourished.  HENT:  Head: Normocephalic and atraumatic.  +Glasses  Eyes: Conjunctivae and EOM are normal.  Neck: Normal range of motion. Neck supple. No thyromegaly present.  Cardiovascular: Normal rate and regular rhythm.   Respiratory: Effort normal and breath sounds normal. No respiratory distress.  +[place]  GI: Soft. Bowel sounds are normal. She exhibits no distension.  Musculoskeletal: She exhibits no edema.  B/l UE: 4/5 B/l LE 3+/5    Neurological: She is alert and oriented to person, place, and time.  Diminished sensation below ~t6 No clonus Neg Homan's  Skin: Skin is warm and dry.  Back incision is dressed  Psychiatric: She has a normal mood and affect. Her behavior is normal.    Results for orders placed or performed during the hospital encounter of 02/14/15 (from the past 24 hour(s))  CBC WITH DIFFERENTIAL     Status: Abnormal   Collection Time: 02/17/15  4:41 AM  Result Value Ref Range   WBC 8.2 4.0 - 10.5 K/uL   RBC 3.31 (L) 3.87 - 5.11 MIL/uL   Hemoglobin 9.5 (L) 12.0 - 15.0 g/dL   HCT 28.8 (L) 36.0 - 46.0 %   MCV 87.0 78.0 - 100.0 fL   MCH 28.7 26.0 - 34.0 pg   MCHC 33.0 30.0 - 36.0 g/dL   RDW 12.9 11.5 - 15.5 %   Platelets 187  150 - 400 K/uL   Neutrophils Relative % 89 %   Neutro Abs 7.2 1.7 - 7.7 K/uL   Lymphocytes Relative 6 %   Lymphs Abs 0.5 (L) 0.7 - 4.0 K/uL   Monocytes Relative 5 %   Monocytes Absolute 0.4 0.1 - 1.0 K/uL   Eosinophils Relative 0 %   Eosinophils Absolute 0.0 0.0 - 0.7 K/uL   Basophils Relative 0 %   Basophils Absolute 0.0 0.0 - 0.1 K/uL  Comprehensive metabolic panel     Status: Abnormal   Collection Time: 02/17/15  4:41 AM  Result Value Ref Range   Sodium 133 (L) 135 - 145 mmol/L   Potassium 4.8 3.5 - 5.1 mmol/L   Chloride 98 (L) 101 - 111 mmol/L   CO2 28 22 - 32 mmol/L   Glucose, Bld 158 (H) 65 - 99 mg/dL   BUN 14 6 - 20 mg/dL   Creatinine, Ser 0.91 0.44 - 1.00 mg/dL   Calcium 9.0 8.9 - 10.3 mg/dL   Total Protein 5.7 (L) 6.5 - 8.1 g/dL   Albumin 3.0 (L) 3.5 - 5.0 g/dL   AST 47 (H) 15 - 41 U/L   ALT 29 14 - 54 U/L   Alkaline Phosphatase 60 38 - 126 U/L   Total Bilirubin 0.5 0.3 - 1.2 mg/dL   GFR calc non Af Amer >60 >60 mL/min   GFR calc Af Amer >60 >60 mL/min   Anion gap 7 5 - 15  Vitamin B12     Status: None   Collection Time: 02/17/15 10:01 AM  Result Value Ref Range   Vitamin B-12 615 180 - 914 pg/mL  Folate     Status: None   Collection Time: 02/17/15  10:01 AM  Result Value Ref Range   Folate 20.4 >5.9 ng/mL  Iron and TIBC     Status: None   Collection Time: 02/17/15 10:01 AM  Result Value Ref Range   Iron 29 28 - 170 ug/dL   TIBC 267 250 - 450 ug/dL   Saturation Ratios 11 10.4 - 31.8 %   UIBC 238 ug/dL  Ferritin     Status: None   Collection Time: 02/17/15 10:01 AM  Result Value Ref Range   Ferritin 65 11 - 307 ng/mL  Reticulocytes     Status: Abnormal   Collection Time: 02/17/15 10:01 AM  Result Value Ref Range   Retic Ct Pct 1.4 0.4 - 3.1 %   RBC. 3.61 (L) 3.87 - 5.11 MIL/uL   Retic Count, Manual 50.5 19.0 - 186.0 K/uL   Dg Thoracic Spine 2 View  02/16/2015   CLINICAL DATA:  Surgical fixation of thoracic spine  EXAM: OPERATIVE THORACIC SPINE 2 VIEW(S)  COMPARISON:  CT thoracic spine 02/15/2015  FINDINGS: Intraoperative fluoroscopy is utilized for surgical control purposes. Fluoroscopy time is recorded at 2 minutes 10 seconds. Exposure dose is not indicated.  Postoperative changes with posterior rod and screw fixation of the upper thoracic spine bridging the destructive lesion demonstrated at T5 on previous CT scan. Thoracic spine alignment appears grossly intact. Enteric and endotracheal tubes are noted.  IMPRESSION: Intraoperative fluoroscopy utilized for surgical control purposes demonstrating posterior fixation of the upper thoracic spine.   Electronically Signed   By: Lucienne Capers M.D.   On: 02/16/2015 01:22   Ct Thoracic Spine Wo Contrast  02/15/2015   CLINICAL DATA:  Metastatic breast cancer with destructive tumor at T4. Preoperative evaluation.  EXAM: CT THORACIC SPINE WITHOUT CONTRAST  TECHNIQUE:  Multidetector CT imaging of the thoracic spine was performed without intravenous contrast administration. Multiplanar CT image reconstructions were also generated.  COMPARISON:  MRI 02/14/2015  FINDINGS: No significant finding from C7 through T3.  At T4, there is tumor involvement with destruction of the lamina and inferior facet  on the right. Lamina on the left is intact.  At T5, there is a pathologic compression fracture with loss of height of 50-60%. The posterior portion of the vertebral body shows lytic destruction and tumor encroaches upon the spinal canal as better shown by MRI. There is lytic destruction of the posterior elements also at this level bilaterally. The ribs are intact. The spinous processes largely intact.  At T6, there is a small lesion in the anterior aspect of the vertebral body very difficult to see by CT. There is possible or early involvement of the right superior articular facet better shown at MRI.  Below that, no abnormality in the thoracic region is visible by CT.  IMPRESSION: Lytic destructive lesion of the right lamina and inferior articular facet at T4.  Lytic destruction of the posterior aspect of the T5 vertebral body with pathologic compression fracture resulting in loss of height of 50-60%. Tumor compromise of the canal better shown at MRI. Lytic destruction of the posterior elements at the T5 level is well. The ribs are not involved.  Minimal involvement of the anterior superior aspect of the T6 vertebral body and possibly of the superior articular facet on the right at T6. Abnormalities better shown at MR at that level.   Electronically Signed   By: Nelson Chimes M.D.   On: 02/15/2015 14:24   Dg C-arm Gt 120 Min  02/16/2015   CLINICAL DATA:  Surgical fixation of thoracic spine  EXAM: OPERATIVE THORACIC SPINE 2 VIEW(S)  COMPARISON:  CT thoracic spine 02/15/2015  FINDINGS: Intraoperative fluoroscopy is utilized for surgical control purposes. Fluoroscopy time is recorded at 2 minutes 10 seconds. Exposure dose is not indicated.  Postoperative changes with posterior rod and screw fixation of the upper thoracic spine bridging the destructive lesion demonstrated at T5 on previous CT scan. Thoracic spine alignment appears grossly intact. Enteric and endotracheal tubes are noted.  IMPRESSION: Intraoperative  fluoroscopy utilized for surgical control purposes demonstrating posterior fixation of the upper thoracic spine.   Electronically Signed   By: Lucienne Capers M.D.   On: 02/16/2015 01:22    Assessment/Plan: Diagnosis: T5 cord compression/myelopathy from pathologic compression fracture Labs and images independently reviewed.  Old records reviewed and summated above.     Respiratory: encourage early use of incentive spirometry as tolerated,assisted cough and deep breathing techniques.      Skin: daily skin checks, turn q2 (care with the spine)     Cardiovascular: may have orthostasis when OOB. May use    abdominal binder, TEDs or ace wraps to BLE for this. If ineffective, consider salt tabs, midodrine or fludrocortisone.       Neuro: monitor for autonomic dysreflexia.     Extremities:. Continue ROM.     Psych: psychology consult for adjustment to disability for pt and family     Mobility: PT and OT evaluation for mobility, ADLS, strengthening     Spasticity: may develop spasticity. Manage spasticity only if indicated (pain, hygiene, prevention of contractures, functional impairment).     Electrolyte: at risk for immobilization hypercalcemia, monitor labs.     Thermoregulation: may have poikilothermia due to high level SCI. Please adjust room temperature as needed according to body  temp.     Pain Management:  control with oral medications if possible     Bladder:  Implement bladder program . Encourage self I&O cath training vs  indwelling foley if possible to improve mobility, reduce infection, and  increase safety     Bowel: Continue stress ulcer ppx.  Implement mechanical and chemical bowel program and care  training with scheduled suppository 30 min to 1 hour after meals to utilize gastrocolic and colorectal reflexes.   1. Does the need for close, 24 hr/day medical supervision in concert with the patient's rehab needs make it unreasonable for this patient to be served in a less intensive  setting? Yes Co-Morbidities requiring supervision/potential complications: HTN, breast CA,  pain management, hyponatremia, transaminitis, ABLA 2. Due to bladder management, bowel management, safety, skin/wound care, disease management, pain management and patient education, does the patient require 24 hr/day rehab nursing? Yes 3. Does the patient require coordinated care of a physician, rehab nurse, PT (1.5-2 hrs/day, 5 days/week) and OT (1.5-2 hrs/day, 5 days/week) to address physical and functional deficits in the context of the above medical diagnosis(es)? Yes Addressing deficits in the following areas: balance, endurance, locomotion, strength, transferring, bowel/bladder control, dressing, toileting and psychosocial support 4. Can the patient actively participate in an intensive therapy program of at least 3 hrs of therapy per day at least 5 days per week? Potentially 5. The potential for patient to make measurable gains while on inpatient rehab is excellent and good 6. Anticipated functional outcomes upon discharge from inpatient rehab are modified independent and supervision  with PT, modified independent and supervision with OT, n/a with SLP. 7. Estimated rehab length of stay to reach the above functional goals is: 12-14 days. 8. Does the patient have adequate social supports and living environment to accommodate these discharge functional goals? Potentially 9. Anticipated D/C setting: Home 10. Anticipated post D/C treatments: HH therapy and Home excercise program 11. Overall Rehab/Functional Prognosis: excellent and good  RECOMMENDATIONS: This patient's condition is appropriate for continued rehabilitative care in the following setting: CIR Patient has agreed to participate in recommended program. Yes Note that insurance prior authorization may be required for reimbursement for recommended care.  Comment: Rehab Admissions Coordinator to follow up.  Delice Lesch, MD 02/17/2015

## 2015-02-18 ENCOUNTER — Inpatient Hospital Stay (HOSPITAL_COMMUNITY)
Admission: AD | Admit: 2015-02-18 | Discharge: 2015-03-12 | DRG: 052 | Disposition: A | Discharge status: Home Health | Payer: Medicare Other | Source: Intra-hospital | Attending: Physical Medicine & Rehabilitation | Admitting: Physical Medicine & Rehabilitation

## 2015-02-18 DIAGNOSIS — I129 Hypertensive chronic kidney disease with stage 1 through stage 4 chronic kidney disease, or unspecified chronic kidney disease: Secondary | ICD-10-CM | POA: Diagnosis present

## 2015-02-18 DIAGNOSIS — K5900 Constipation, unspecified: Secondary | ICD-10-CM | POA: Diagnosis present

## 2015-02-18 DIAGNOSIS — G4700 Insomnia, unspecified: Secondary | ICD-10-CM | POA: Diagnosis present

## 2015-02-18 DIAGNOSIS — C7951 Secondary malignant neoplasm of bone: Secondary | ICD-10-CM | POA: Diagnosis present

## 2015-02-18 DIAGNOSIS — Z853 Personal history of malignant neoplasm of breast: Secondary | ICD-10-CM

## 2015-02-18 DIAGNOSIS — R7989 Other specified abnormal findings of blood chemistry: Secondary | ICD-10-CM

## 2015-02-18 DIAGNOSIS — I10 Essential (primary) hypertension: Secondary | ICD-10-CM

## 2015-02-18 DIAGNOSIS — Z9221 Personal history of antineoplastic chemotherapy: Secondary | ICD-10-CM | POA: Diagnosis not present

## 2015-02-18 DIAGNOSIS — Z885 Allergy status to narcotic agent status: Secondary | ICD-10-CM

## 2015-02-18 DIAGNOSIS — R29898 Other symptoms and signs involving the musculoskeletal system: Secondary | ICD-10-CM

## 2015-02-18 DIAGNOSIS — M4714 Other spondylosis with myelopathy, thoracic region: Secondary | ICD-10-CM | POA: Diagnosis not present

## 2015-02-18 DIAGNOSIS — G9520 Unspecified cord compression: Secondary | ICD-10-CM

## 2015-02-18 DIAGNOSIS — Z79899 Other long term (current) drug therapy: Secondary | ICD-10-CM | POA: Diagnosis not present

## 2015-02-18 DIAGNOSIS — C50919 Malignant neoplasm of unspecified site of unspecified female breast: Secondary | ICD-10-CM

## 2015-02-18 DIAGNOSIS — R531 Weakness: Secondary | ICD-10-CM | POA: Diagnosis not present

## 2015-02-18 DIAGNOSIS — R52 Pain, unspecified: Secondary | ICD-10-CM

## 2015-02-18 DIAGNOSIS — Z888 Allergy status to other drugs, medicaments and biological substances status: Secondary | ICD-10-CM | POA: Diagnosis not present

## 2015-02-18 DIAGNOSIS — D696 Thrombocytopenia, unspecified: Secondary | ICD-10-CM | POA: Diagnosis not present

## 2015-02-18 DIAGNOSIS — G839 Paralytic syndrome, unspecified: Secondary | ICD-10-CM | POA: Diagnosis not present

## 2015-02-18 DIAGNOSIS — Z923 Personal history of irradiation: Secondary | ICD-10-CM | POA: Diagnosis not present

## 2015-02-18 DIAGNOSIS — D62 Acute posthemorrhagic anemia: Secondary | ICD-10-CM | POA: Diagnosis present

## 2015-02-18 DIAGNOSIS — F411 Generalized anxiety disorder: Secondary | ICD-10-CM | POA: Diagnosis present

## 2015-02-18 DIAGNOSIS — G8220 Paraplegia, unspecified: Principal | ICD-10-CM | POA: Diagnosis present

## 2015-02-18 DIAGNOSIS — C50411 Malignant neoplasm of upper-outer quadrant of right female breast: Secondary | ICD-10-CM | POA: Diagnosis present

## 2015-02-18 DIAGNOSIS — N390 Urinary tract infection, site not specified: Secondary | ICD-10-CM | POA: Insufficient documentation

## 2015-02-18 DIAGNOSIS — Z881 Allergy status to other antibiotic agents status: Secondary | ICD-10-CM | POA: Diagnosis not present

## 2015-02-18 DIAGNOSIS — E785 Hyperlipidemia, unspecified: Secondary | ICD-10-CM

## 2015-02-18 DIAGNOSIS — G992 Myelopathy in diseases classified elsewhere: Secondary | ICD-10-CM | POA: Diagnosis present

## 2015-02-18 DIAGNOSIS — R32 Unspecified urinary incontinence: Secondary | ICD-10-CM | POA: Diagnosis present

## 2015-02-18 DIAGNOSIS — K592 Neurogenic bowel, not elsewhere classified: Secondary | ICD-10-CM | POA: Diagnosis present

## 2015-02-18 DIAGNOSIS — N319 Neuromuscular dysfunction of bladder, unspecified: Secondary | ICD-10-CM | POA: Diagnosis not present

## 2015-02-18 DIAGNOSIS — G629 Polyneuropathy, unspecified: Secondary | ICD-10-CM | POA: Diagnosis present

## 2015-02-18 DIAGNOSIS — G9529 Other cord compression: Secondary | ICD-10-CM | POA: Diagnosis present

## 2015-02-18 DIAGNOSIS — D0590 Unspecified type of carcinoma in situ of unspecified breast: Secondary | ICD-10-CM | POA: Diagnosis not present

## 2015-02-18 DIAGNOSIS — N189 Chronic kidney disease, unspecified: Secondary | ICD-10-CM | POA: Diagnosis present

## 2015-02-18 DIAGNOSIS — F4322 Adjustment disorder with anxiety: Secondary | ICD-10-CM | POA: Diagnosis present

## 2015-02-18 DIAGNOSIS — Z886 Allergy status to analgesic agent status: Secondary | ICD-10-CM

## 2015-02-18 DIAGNOSIS — K219 Gastro-esophageal reflux disease without esophagitis: Secondary | ICD-10-CM | POA: Diagnosis present

## 2015-02-18 DIAGNOSIS — M8448XA Pathological fracture, other site, initial encounter for fracture: Secondary | ICD-10-CM

## 2015-02-18 LAB — CBC
HCT: 29.5 % — ABNORMAL LOW (ref 36.0–46.0)
Hemoglobin: 9.7 g/dL — ABNORMAL LOW (ref 12.0–15.0)
MCH: 28.9 pg (ref 26.0–34.0)
MCHC: 32.9 g/dL (ref 30.0–36.0)
MCV: 87.8 fL (ref 78.0–100.0)
Platelets: 185 10*3/uL (ref 150–400)
RBC: 3.36 MIL/uL — ABNORMAL LOW (ref 3.87–5.11)
RDW: 13.1 % (ref 11.5–15.5)
WBC: 6.1 10*3/uL (ref 4.0–10.5)

## 2015-02-18 LAB — BASIC METABOLIC PANEL
Anion gap: 10 (ref 5–15)
BUN: 18 mg/dL (ref 6–20)
CO2: 31 mmol/L (ref 22–32)
Calcium: 9.4 mg/dL (ref 8.9–10.3)
Chloride: 96 mmol/L — ABNORMAL LOW (ref 101–111)
Creatinine, Ser: 0.77 mg/dL (ref 0.44–1.00)
GFR calc Af Amer: >60 mL/min (ref >60)
GFR calc non Af Amer: >60 mL/min (ref >60)
Glucose, Bld: 141 mg/dL — ABNORMAL HIGH (ref 65–99)
Potassium: 4.2 mmol/L (ref 3.5–5.1)
Sodium: 137 mmol/L (ref 135–145)

## 2015-02-18 MED ORDER — ALUM & MAG HYDROXIDE-SIMETH 200-200-20 MG/5ML PO SUSP
30.0000 mL | ORAL | Status: DC | PRN
  Administered 2015-02-27 – 2015-03-08 (×6): 30 mL via ORAL
  Filled 2015-02-18 (×7): qty 30

## 2015-02-18 MED ORDER — SACCHAROMYCES BOULARDII 250 MG PO CAPS
250.0000 mg | ORAL_CAPSULE | Freq: Two times a day (BID) | ORAL | Status: DC
  Administered 2015-02-19 – 2015-03-12 (×44): 250 mg via ORAL
  Filled 2015-02-18 (×44): qty 1

## 2015-02-18 MED ORDER — LETROZOLE 2.5 MG PO TABS
2.5000 mg | ORAL_TABLET | Freq: Every day | ORAL | Status: DC
  Administered 2015-02-19 – 2015-03-12 (×22): 2.5 mg via ORAL
  Filled 2015-02-18 (×24): qty 1

## 2015-02-18 MED ORDER — FLEET ENEMA 7-19 GM/118ML RE ENEM
1.0000 | ENEMA | Freq: Once | RECTAL | Status: AC | PRN
  Administered 2015-03-12: 1 via RECTAL
  Filled 2015-02-18: qty 1

## 2015-02-18 MED ORDER — ENSURE ENLIVE PO LIQD
237.0000 mL | Freq: Three times a day (TID) | ORAL | Status: DC
  Administered 2015-02-18 – 2015-03-11 (×25): 237 mL via ORAL

## 2015-02-18 MED ORDER — DEXAMETHASONE SODIUM PHOSPHATE 4 MG/ML IJ SOLN
4.0000 mg | Freq: Four times a day (QID) | INTRAMUSCULAR | Status: DC
Start: 2015-02-19 — End: 2015-02-22
  Administered 2015-02-19 – 2015-02-22 (×16): 4 mg via INTRAVENOUS
  Filled 2015-02-18 (×18): qty 1

## 2015-02-18 MED ORDER — FLEET ENEMA 7-19 GM/118ML RE ENEM
1.0000 | ENEMA | Freq: Once | RECTAL | Status: AC
  Administered 2015-02-18: 1 via RECTAL
  Filled 2015-02-18: qty 1

## 2015-02-18 MED ORDER — SENNOSIDES-DOCUSATE SODIUM 8.6-50 MG PO TABS: 1.0000 | ORAL_TABLET | Freq: Every evening | ORAL | Status: DC | PRN

## 2015-02-18 MED ORDER — SPIRONOLACTONE 25 MG PO TABS
12.5000 mg | ORAL_TABLET | Freq: Every day | ORAL | Status: DC
  Administered 2015-02-19 – 2015-03-12 (×22): 12.5 mg via ORAL
  Filled 2015-02-18 (×22): qty 1

## 2015-02-18 MED ORDER — ONDANSETRON HCL 4 MG PO TABS: 4.0000 mg | ORAL_TABLET | Freq: Four times a day (QID) | ORAL | Status: DC | PRN

## 2015-02-18 MED ORDER — BISACODYL 10 MG RE SUPP
10.0000 mg | Freq: Every day | RECTAL | Status: DC | PRN
  Administered 2015-02-20 – 2015-03-10 (×2): 10 mg via RECTAL
  Filled 2015-02-18 (×2): qty 1

## 2015-02-18 MED ORDER — FAMOTIDINE 20 MG PO TABS
20.0000 mg | ORAL_TABLET | Freq: Two times a day (BID) | ORAL | Status: DC
  Administered 2015-02-18 – 2015-03-12 (×44): 20 mg via ORAL
  Filled 2015-02-18 (×4): qty 1
  Filled 2015-02-18: qty 2
  Filled 2015-02-18 (×41): qty 1
  Filled 2015-02-18: qty 2

## 2015-02-18 MED ORDER — TRAMADOL HCL 50 MG PO TABS
50.0000 mg | ORAL_TABLET | ORAL | Status: DC | PRN
  Administered 2015-02-19 – 2015-03-12 (×39): 50 mg via ORAL
  Filled 2015-02-18 (×40): qty 1

## 2015-02-18 MED ORDER — OXYCODONE HCL 5 MG PO TABS
10.0000 mg | ORAL_TABLET | ORAL | Status: DC | PRN
  Administered 2015-02-19 – 2015-02-25 (×4): 10 mg via ORAL
  Filled 2015-02-18 (×6): qty 2

## 2015-02-18 MED ORDER — DEXAMETHASONE 4 MG PO TABS: 4.0000 mg | ORAL_TABLET | Freq: Three times a day (TID) | ORAL | Status: DC

## 2015-02-18 MED ORDER — OXYCODONE-ACETAMINOPHEN 5-325 MG PO TABS: 1.0000 | ORAL_TABLET | ORAL | Status: DC | PRN

## 2015-02-18 MED ORDER — METHOCARBAMOL 500 MG PO TABS
500.0000 mg | ORAL_TABLET | Freq: Four times a day (QID) | ORAL | Status: DC | PRN
  Administered 2015-02-19 – 2015-03-12 (×15): 500 mg via ORAL
  Filled 2015-02-18 (×15): qty 1

## 2015-02-18 MED ORDER — ONDANSETRON HCL 4 MG/2ML IJ SOLN: 4.0000 mg | Freq: Four times a day (QID) | INTRAMUSCULAR | Status: DC | PRN

## 2015-02-18 MED ORDER — ACETAMINOPHEN 325 MG PO TABS: 325.0000 mg | ORAL_TABLET | ORAL | Status: DC | PRN

## 2015-02-18 MED ORDER — POLYETHYLENE GLYCOL 3350 17 G PO PACK: 17.0000 g | PACK | Freq: Every day | ORAL | Status: DC

## 2015-02-18 MED ORDER — POLYETHYLENE GLYCOL 3350 17 G PO PACK
17.0000 g | PACK | Freq: Two times a day (BID) | ORAL | Status: DC
  Administered 2015-02-18 – 2015-03-12 (×20): 17 g via ORAL
  Filled 2015-02-18 (×33): qty 1

## 2015-02-18 MED ORDER — GUAIFENESIN-DM 100-10 MG/5ML PO SYRP: 5.0000 mL | ORAL_SOLUTION | Freq: Four times a day (QID) | ORAL | Status: DC | PRN

## 2015-02-18 MED ORDER — ENOXAPARIN SODIUM 40 MG/0.4ML ~~LOC~~ SOLN
40.0000 mg | SUBCUTANEOUS | Status: DC
  Administered 2015-02-18 – 2015-03-11 (×22): 40 mg via SUBCUTANEOUS
  Filled 2015-02-18 (×22): qty 0.4

## 2015-02-18 MED ORDER — GABAPENTIN 100 MG PO CAPS
100.0000 mg | ORAL_CAPSULE | Freq: Two times a day (BID) | ORAL | Status: DC
  Administered 2015-02-18 – 2015-02-19 (×2): 100 mg via ORAL
  Filled 2015-02-18: qty 1

## 2015-02-18 MED ORDER — ALPRAZOLAM 0.25 MG PO TABS: 0.2500 mg | ORAL_TABLET | Freq: Three times a day (TID) | ORAL | Status: DC | PRN

## 2015-02-18 MED ORDER — TRAZODONE HCL 50 MG PO TABS
25.0000 mg | ORAL_TABLET | Freq: Every evening | ORAL | Status: DC | PRN
  Administered 2015-02-20: 50 mg via ORAL
  Filled 2015-02-18: qty 1

## 2015-02-18 NOTE — Interval H&P Note (Signed)
Heather [name] was admitted today to Inpatient Rehabilitation with the diagnosis of T5 cord compression/myelopathy from pathologic compression fracture.  The patient's history has been reviewed, patient examined, and there is no change in status.  Patient continues to be appropriate for intensive inpatient rehabilitation.  I have reviewed the patient's chart and labs.  Questions were answered to the patient's satisfaction. The PAPE has been reviewed and assessment remains appropriate.  Heather [name] 02/18/2015, 7:04 PM

## 2015-02-18 NOTE — Discharge Summary (Addendum)
PATIENT DETAILS Name: Heather [name] Age: 69 y.o. Sex: female Date of Birth: 1945/09/05 MRN: [id number]. Admitting Physician: Reubin Milan, MD TKP:[id number] S, MD  Admit Date: 02/14/2015 Discharge date: 02/18/2015  Recommendations for Outpatient Follow-up:  1. Taper off or discontinue Decadron at the discretion of neurosurgery 2. Please follow surgical biopsy-pending at the time of discharge 3. Please repeat CBC/BMET in 1 week  PRIMARY DISCHARGE DIAGNOSIS:  Principal Problem:   Spinal cord compression due to malignant neoplasm metastatic to spine [facility]) Active Problems:   HTN (hypertension)   Pathologic fracture of vertebra   Pathologic fracture of thoracic vertebrae   Breast cancer metastasized to bone (HCC)   Hyperlipidemia   Epidural mass   Thoracic spine tumor   Postoperative anemia due to acute blood loss      PAST MEDICAL HISTORY: Past Medical History  Diagnosis Date  . Chronic kidney disease   . Hx: UTI (urinary tract infection)   . Right shoulder pain   . Right knee pain   . Hypertension     recently started Aldactone   . Hyperlipidemia     but not on meds;diet and exercise controlled  . Dizziness     has been going on for 49month;medical MD aware  . H/O hiatal hernia   . GERD (gastroesophageal reflux disease)     Tums prn  . Hemorrhoids   . Urinary frequency     d/t taking Aldactone  . History of UTI   . Anemia     hx of  . Cataract     immature-not sure of which eye  . Anxiety     r/t updated surgery  . Insomnia     takes Melatoniin daily  . Complication of anesthesia     pt states she is sensitive to meds  . [place]  . Skin cancer   . Hx of radiation therapy 10/25/12- 12/11/12    right chest wall/regional lymph nodes 5040 cGy, 28 sessions, right chest wall boost 1000 cGy 1 session  . H. pylori infection     DISCHARGE MEDICATIONS: Current Discharge Medication List    START taking these medications   Details   dexamethasone (DECADRON) 4 MG tablet Take 1 tablet (4 mg total) by mouth 3 (three) times daily.    oxyCODONE-acetaminophen (PERCOCET/ROXICET) 5-325 MG tablet Take 1-2 tablets by mouth every 4 (four) hours as needed for moderate pain.    polyethylene glycol (MIRALAX / GLYCOLAX) packet Take 17 g by mouth daily.      CONTINUE these medications which have NOT CHANGED   Details  letrozole (FEMARA) 2.5 MG tablet Take 1 tablet (2.5 mg total) by mouth daily. Qty: 90 tablet, Refills: 3   Associated Diagnoses: Cancer of upper-outer quadrant of female breast, right    methocarbamol (ROBAXIN) 500 MG tablet Take 500 mg by mouth daily as needed for muscle spasms.    Probiotic Product (PROBIOTIC DAILY PO) Take 1 capsule by mouth daily.     ranitidine (ZANTAC) 300 MG tablet Take 300 mg by mouth daily.    spironolactone (ALDACTONE) 25 MG tablet Take 0.5 tablets (12.5 mg total) by mouth daily. Qty: 45 tablet, Refills: 12   Associated Diagnoses: HTN (hypertension)    pravastatin (PRAVACHOL) 10 MG tablet Take 10 mg by mouth daily.       STOP taking these medications     B Complex-C (SUPER B COMPLEX PO)      polyethylene glycol  powder (GLYCOLAX/MIRALAX) powder      traMADol (ULTRAM) 50 MG tablet      UNABLE TO FIND      furosemide (LASIX) 20 MG tablet         ALLERGIES:   Allergies  Allergen Reactions  . Codeine     " loopy"  . Keflex [Cephalexin]     GI Upset  . Naprosyn [Naproxen] Other (See Comments)    GI ipset   . Tussin [Guaifenesin] Other (See Comments)    Dizzy     BRIEF HPI:  See H&P, Labs, Consult and Test reports for all details in brief, patient is a 69 year old female with a history of breast cancer status post mastectomy who presented with bilateral lower extremity weakness.  CONSULTATIONS:   Neurosurgery and oncology  PERTINENT RADIOLOGIC STUDIES: Dg Thoracic Spine 2 View  02/16/2015  CLINICAL DATA:  Surgical fixation of thoracic spine EXAM: OPERATIVE  THORACIC SPINE 2 VIEW(S) COMPARISON:  CT thoracic spine 02/15/2015 FINDINGS: Intraoperative fluoroscopy is utilized for surgical control purposes. Fluoroscopy time is recorded at 2 minutes 10 seconds. Exposure dose is not indicated. Postoperative changes with posterior rod and screw fixation of the upper thoracic spine bridging the destructive lesion demonstrated at T5 on previous CT scan. Thoracic spine alignment appears grossly intact. Enteric and endotracheal tubes are noted. IMPRESSION: Intraoperative fluoroscopy utilized for surgical control purposes demonstrating posterior fixation of the upper thoracic spine. Electronically Signed   By: Lucienne Capers M.D.   On: 02/16/2015 01:22   Ct Head Wo Contrast  02/14/2015  CLINICAL DATA:  Bilateral lower extremity weakness. EXAM: CT HEAD WITHOUT CONTRAST TECHNIQUE: Contiguous axial images were obtained from the base of the skull through the vertex without intravenous contrast. COMPARISON:  MRI scan of March 22, 2012. FINDINGS: Bony calvarium appears intact. No mass effect or midline shift is noted. Ventricular size is within normal limits. There is no evidence of mass lesion, hemorrhage or acute infarction. IMPRESSION: Normal head CT. Electronically Signed   By: Marijo Conception, M.D.   On: 02/14/2015 14:24   Ct Thoracic Spine Wo Contrast  02/15/2015  CLINICAL DATA:  Metastatic breast cancer with destructive tumor at T4. Preoperative evaluation. EXAM: CT THORACIC SPINE WITHOUT CONTRAST TECHNIQUE: Multidetector CT imaging of the thoracic spine was performed without intravenous contrast administration. Multiplanar CT image reconstructions were also generated. COMPARISON:  MRI 02/14/2015 FINDINGS: No significant finding from C7 through T3. At T4, there is tumor involvement with destruction of the lamina and inferior facet on the right. Lamina on the left is intact. At T5, there is a pathologic compression fracture with loss of height of 50-60%. The posterior  portion of the vertebral body shows lytic destruction and tumor encroaches upon the spinal canal as better shown by MRI. There is lytic destruction of the posterior elements also at this level bilaterally. The ribs are intact. The spinous processes largely intact. At T6, there is a small lesion in the anterior aspect of the vertebral body very difficult to see by CT. There is possible or early involvement of the right superior articular facet better shown at MRI. Below that, no abnormality in the thoracic region is visible by CT. IMPRESSION: Lytic destructive lesion of the right lamina and inferior articular facet at T4. Lytic destruction of the posterior aspect of the T5 vertebral body with pathologic compression fracture resulting in loss of height of 50-60%. Tumor compromise of the canal better shown at MRI. Lytic destruction of the posterior elements at the  T5 level is well. The ribs are not involved. Minimal involvement of the anterior superior aspect of the T6 vertebral body and possibly of the superior articular facet on the right at T6. Abnormalities better shown at MR at that level. Electronically Signed   By: Nelson Chimes M.D.   On: 02/15/2015 14:24   Mr Jeri Cos KV Contrast  02/14/2015  CLINICAL DATA:  Bilateral leg weakness and numbness for 1 week. History of breast cancer status post mastectomy in 2014. EXAM: MRI HEAD WITHOUT AND WITH CONTRAST; MRI CERVICAL SPINE WITHOUT AND WITH CONTRAST; MRI LUMBAR SPINE WITHOUT AND WITH CONTRAST; MRI THORACIC SPINE WITHOUT AND WITH CONTRAST TECHNIQUE: Multiplanar, multiecho pulse sequences of the brain and surrounding structures were obtained according to standard protocol without and with intravenous contrast; Multiplanar and multiecho pulse sequences of the cervical spine, to include the craniocervical junction and cervicothoracic junction, were obtained according to standard protocol without and with intravenous contrast.; Multiplanar and multiecho pulse  sequences of the lumbar spine were obtained without and with intravenous contrast.; Multiplanar and multiecho pulse sequences of the thoracic spine were obtained without and with intravenous contrast. CONTRAST:  104m MULTIHANCE GADOBENATE DIMEGLUMINE 529 MG/ML IV SOLN COMPARISON:  Head CT 02/14/2015. CT abdomen and pelvis 02/06/2015. Brain MRI and PET-CT 03/22/2012. FINDINGS: MRI HEAD: There is no evidence of acute infarct, intracranial hemorrhage, mass, midline shift, or extra-axial fluid collection. Ventricles and sulci are normal. Punctate foci of T2 hyperintensity in the subcortical cerebral white matter bilaterally are similar to the prior MRI and nonspecific but may reflect minimal chronic small vessel ischemic disease. No abnormal enhancement is identified. Orbits are unremarkable. Paranasal sinuses and mastoid air cells are clear. Major intracranial vascular flow voids are preserved. No suspicious skull lesions are identified. MRI CERVICAL SPINE: Vertebral alignment is normal. Vertebral body heights are preserved. There is mild-to-moderate disc space narrowing at C5-6 with associated degenerative endplate changes. No lesions suspicious for metastatic disease are identified in the cervical spine. There is mild disc bulging at C5-6 and C6-7 without evidence of significant stenosis. Paraspinal soft tissues are unremarkable. MRI THORACIC SPINE: There is a severe pathologic compression fracture at T5 with focal kyphosis at this level. Tumor diffusely involves the T5 vertebral body and posterior elements and there is extensive circumferential epidural tumor in the spinal canal resulting in severe spinal stenosis and moderate cord compression. No substantial spinal cord edema is identified. Tumor also involves the right T4 inferior articular process and lamina and extends into the right greater than left neural foramina at T4-5 and T5-6 with associated foraminal stenosis. There is paravertebral soft tissue tumor at  T5. There is a 6 mm T2 hyperintense enhancing focus along the T6 superior endplate, and there is a faint T2 hyperintense and mildly enhancing focus in the inferior T12 vertebral body measuring 8 mm. No epidural tumor is identified elsewhere in the thoracic spine. Thoracic vertebral body heights are preserved elsewhere, and there is no significant thoracic spine degenerative change. Mild dependent atelectasis is present in the lungs. MRI LUMBAR SPINE: Vertebral alignment is normal. Vertebral body heights and intervertebral disc space heights are preserved. There is diffuse disc desiccation with mild multilevel type 2 degenerative endplate marrow changes. A 1 cm lesion in the L5 vertebral body is favored to represent a hemangioma. There are 6 mm T2 hyperintense, mildly enhancing foci along the L1 and L2 inferior endplates. No destructive osseous lesion or epidural tumor is seen in the lumbar spine. Conus medullaris is normal in  signal and terminates at L1-2. There is no significant disc or facet degeneration, and there is no lumbar stenosis. IMPRESSION: 1. Large, destructive T5 lesion with severe pathologic compression fracture and extensive epidural tumor. Severe spinal stenosis with moderate cord compression. 2. Posterior element tumor involvement at T4. 3. Small, scattered foci of mild enhancement elsewhere in the thoracic and lumbar spine as above, indeterminate with considerations including degenerative changes and small metastases. 4. No evidence of acute intracranial abnormality or intracranial metastatic disease. 5. No evidence of metastatic disease in the cervical spine. Critical Value/emergent results were called by telephone at the time of interpretation on 02/14/2015 at 5:55 pm to Dr. Deno Etienne , who verbally acknowledged these results. Electronically Signed   By: Logan Bores M.D.   On: 02/14/2015 17:57   Mr Cervical Spine W Wo Contrast  02/14/2015  CLINICAL DATA:  Bilateral leg weakness and numbness  for 1 week. History of breast cancer status post mastectomy in 2014. EXAM: MRI HEAD WITHOUT AND WITH CONTRAST; MRI CERVICAL SPINE WITHOUT AND WITH CONTRAST; MRI LUMBAR SPINE WITHOUT AND WITH CONTRAST; MRI THORACIC SPINE WITHOUT AND WITH CONTRAST TECHNIQUE: Multiplanar, multiecho pulse sequences of the brain and surrounding structures were obtained according to standard protocol without and with intravenous contrast; Multiplanar and multiecho pulse sequences of the cervical spine, to include the craniocervical junction and cervicothoracic junction, were obtained according to standard protocol without and with intravenous contrast.; Multiplanar and multiecho pulse sequences of the lumbar spine were obtained without and with intravenous contrast.; Multiplanar and multiecho pulse sequences of the thoracic spine were obtained without and with intravenous contrast. CONTRAST:  31m MULTIHANCE GADOBENATE DIMEGLUMINE 529 MG/ML IV SOLN COMPARISON:  Head CT 02/14/2015. CT abdomen and pelvis 02/06/2015. Brain MRI and PET-CT 03/22/2012. FINDINGS: MRI HEAD: There is no evidence of acute infarct, intracranial hemorrhage, mass, midline shift, or extra-axial fluid collection. Ventricles and sulci are normal. Punctate foci of T2 hyperintensity in the subcortical cerebral white matter bilaterally are similar to the prior MRI and nonspecific but may reflect minimal chronic small vessel ischemic disease. No abnormal enhancement is identified. Orbits are unremarkable. Paranasal sinuses and mastoid air cells are clear. Major intracranial vascular flow voids are preserved. No suspicious skull lesions are identified. MRI CERVICAL SPINE: Vertebral alignment is normal. Vertebral body heights are preserved. There is mild-to-moderate disc space narrowing at C5-6 with associated degenerative endplate changes. No lesions suspicious for metastatic disease are identified in the cervical spine. There is mild disc bulging at C5-6 and C6-7 without  evidence of significant stenosis. Paraspinal soft tissues are unremarkable. MRI THORACIC SPINE: There is a severe pathologic compression fracture at T5 with focal kyphosis at this level. Tumor diffusely involves the T5 vertebral body and posterior elements and there is extensive circumferential epidural tumor in the spinal canal resulting in severe spinal stenosis and moderate cord compression. No substantial spinal cord edema is identified. Tumor also involves the right T4 inferior articular process and lamina and extends into the right greater than left neural foramina at T4-5 and T5-6 with associated foraminal stenosis. There is paravertebral soft tissue tumor at T5. There is a 6 mm T2 hyperintense enhancing focus along the T6 superior endplate, and there is a faint T2 hyperintense and mildly enhancing focus in the inferior T12 vertebral body measuring 8 mm. No epidural tumor is identified elsewhere in the thoracic spine. Thoracic vertebral body heights are preserved elsewhere, and there is no significant thoracic spine degenerative change. Mild dependent atelectasis is present in the  lungs. MRI LUMBAR SPINE: Vertebral alignment is normal. Vertebral body heights and intervertebral disc space heights are preserved. There is diffuse disc desiccation with mild multilevel type 2 degenerative endplate marrow changes. A 1 cm lesion in the L5 vertebral body is favored to represent a hemangioma. There are 6 mm T2 hyperintense, mildly enhancing foci along the L1 and L2 inferior endplates. No destructive osseous lesion or epidural tumor is seen in the lumbar spine. Conus medullaris is normal in signal and terminates at L1-2. There is no significant disc or facet degeneration, and there is no lumbar stenosis. IMPRESSION: 1. Large, destructive T5 lesion with severe pathologic compression fracture and extensive epidural tumor. Severe spinal stenosis with moderate cord compression. 2. Posterior element tumor involvement at T4.  3. Small, scattered foci of mild enhancement elsewhere in the thoracic and lumbar spine as above, indeterminate with considerations including degenerative changes and small metastases. 4. No evidence of acute intracranial abnormality or intracranial metastatic disease. 5. No evidence of metastatic disease in the cervical spine. Critical Value/emergent results were called by telephone at the time of interpretation on 02/14/2015 at 5:55 pm to Dr. Deno Etienne , who verbally acknowledged these results. Electronically Signed   By: Logan Bores M.D.   On: 02/14/2015 17:57   Mr Thoracic Spine W Wo Contrast  02/14/2015  CLINICAL DATA:  Bilateral leg weakness and numbness for 1 week. History of breast cancer status post mastectomy in 2014. EXAM: MRI HEAD WITHOUT AND WITH CONTRAST; MRI CERVICAL SPINE WITHOUT AND WITH CONTRAST; MRI LUMBAR SPINE WITHOUT AND WITH CONTRAST; MRI THORACIC SPINE WITHOUT AND WITH CONTRAST TECHNIQUE: Multiplanar, multiecho pulse sequences of the brain and surrounding structures were obtained according to standard protocol without and with intravenous contrast; Multiplanar and multiecho pulse sequences of the cervical spine, to include the craniocervical junction and cervicothoracic junction, were obtained according to standard protocol without and with intravenous contrast.; Multiplanar and multiecho pulse sequences of the lumbar spine were obtained without and with intravenous contrast.; Multiplanar and multiecho pulse sequences of the thoracic spine were obtained without and with intravenous contrast. CONTRAST:  39m MULTIHANCE GADOBENATE DIMEGLUMINE 529 MG/ML IV SOLN COMPARISON:  Head CT 02/14/2015. CT abdomen and pelvis 02/06/2015. Brain MRI and PET-CT 03/22/2012. FINDINGS: MRI HEAD: There is no evidence of acute infarct, intracranial hemorrhage, mass, midline shift, or extra-axial fluid collection. Ventricles and sulci are normal. Punctate foci of T2 hyperintensity in the subcortical cerebral  white matter bilaterally are similar to the prior MRI and nonspecific but may reflect minimal chronic small vessel ischemic disease. No abnormal enhancement is identified. Orbits are unremarkable. Paranasal sinuses and mastoid air cells are clear. Major intracranial vascular flow voids are preserved. No suspicious skull lesions are identified. MRI CERVICAL SPINE: Vertebral alignment is normal. Vertebral body heights are preserved. There is mild-to-moderate disc space narrowing at C5-6 with associated degenerative endplate changes. No lesions suspicious for metastatic disease are identified in the cervical spine. There is mild disc bulging at C5-6 and C6-7 without evidence of significant stenosis. Paraspinal soft tissues are unremarkable. MRI THORACIC SPINE: There is a severe pathologic compression fracture at T5 with focal kyphosis at this level. Tumor diffusely involves the T5 vertebral body and posterior elements and there is extensive circumferential epidural tumor in the spinal canal resulting in severe spinal stenosis and moderate cord compression. No substantial spinal cord edema is identified. Tumor also involves the right T4 inferior articular process and lamina and extends into the right greater than left neural foramina at T4-5  and T5-6 with associated foraminal stenosis. There is paravertebral soft tissue tumor at T5. There is a 6 mm T2 hyperintense enhancing focus along the T6 superior endplate, and there is a faint T2 hyperintense and mildly enhancing focus in the inferior T12 vertebral body measuring 8 mm. No epidural tumor is identified elsewhere in the thoracic spine. Thoracic vertebral body heights are preserved elsewhere, and there is no significant thoracic spine degenerative change. Mild dependent atelectasis is present in the lungs. MRI LUMBAR SPINE: Vertebral alignment is normal. Vertebral body heights and intervertebral disc space heights are preserved. There is diffuse disc desiccation with  mild multilevel type 2 degenerative endplate marrow changes. A 1 cm lesion in the L5 vertebral body is favored to represent a hemangioma. There are 6 mm T2 hyperintense, mildly enhancing foci along the L1 and L2 inferior endplates. No destructive osseous lesion or epidural tumor is seen in the lumbar spine. Conus medullaris is normal in signal and terminates at L1-2. There is no significant disc or facet degeneration, and there is no lumbar stenosis. IMPRESSION: 1. Large, destructive T5 lesion with severe pathologic compression fracture and extensive epidural tumor. Severe spinal stenosis with moderate cord compression. 2. Posterior element tumor involvement at T4. 3. Small, scattered foci of mild enhancement elsewhere in the thoracic and lumbar spine as above, indeterminate with considerations including degenerative changes and small metastases. 4. No evidence of acute intracranial abnormality or intracranial metastatic disease. 5. No evidence of metastatic disease in the cervical spine. Critical Value/emergent results were called by telephone at the time of interpretation on 02/14/2015 at 5:55 pm to Dr. Deno Etienne , who verbally acknowledged these results. Electronically Signed   By: Logan Bores M.D.   On: 02/14/2015 17:57   Mr Lumbar Spine W Wo Contrast  02/14/2015  CLINICAL DATA:  Bilateral leg weakness and numbness for 1 week. History of breast cancer status post mastectomy in 2014. EXAM: MRI HEAD WITHOUT AND WITH CONTRAST; MRI CERVICAL SPINE WITHOUT AND WITH CONTRAST; MRI LUMBAR SPINE WITHOUT AND WITH CONTRAST; MRI THORACIC SPINE WITHOUT AND WITH CONTRAST TECHNIQUE: Multiplanar, multiecho pulse sequences of the brain and surrounding structures were obtained according to standard protocol without and with intravenous contrast; Multiplanar and multiecho pulse sequences of the cervical spine, to include the craniocervical junction and cervicothoracic junction, were obtained according to standard protocol  without and with intravenous contrast.; Multiplanar and multiecho pulse sequences of the lumbar spine were obtained without and with intravenous contrast.; Multiplanar and multiecho pulse sequences of the thoracic spine were obtained without and with intravenous contrast. CONTRAST:  76m MULTIHANCE GADOBENATE DIMEGLUMINE 529 MG/ML IV SOLN COMPARISON:  Head CT 02/14/2015. CT abdomen and pelvis 02/06/2015. Brain MRI and PET-CT 03/22/2012. FINDINGS: MRI HEAD: There is no evidence of acute infarct, intracranial hemorrhage, mass, midline shift, or extra-axial fluid collection. Ventricles and sulci are normal. Punctate foci of T2 hyperintensity in the subcortical cerebral white matter bilaterally are similar to the prior MRI and nonspecific but may reflect minimal chronic small vessel ischemic disease. No abnormal enhancement is identified. Orbits are unremarkable. Paranasal sinuses and mastoid air cells are clear. Major intracranial vascular flow voids are preserved. No suspicious skull lesions are identified. MRI CERVICAL SPINE: Vertebral alignment is normal. Vertebral body heights are preserved. There is mild-to-moderate disc space narrowing at C5-6 with associated degenerative endplate changes. No lesions suspicious for metastatic disease are identified in the cervical spine. There is mild disc bulging at C5-6 and C6-7 without evidence of significant stenosis. Paraspinal soft  tissues are unremarkable. MRI THORACIC SPINE: There is a severe pathologic compression fracture at T5 with focal kyphosis at this level. Tumor diffusely involves the T5 vertebral body and posterior elements and there is extensive circumferential epidural tumor in the spinal canal resulting in severe spinal stenosis and moderate cord compression. No substantial spinal cord edema is identified. Tumor also involves the right T4 inferior articular process and lamina and extends into the right greater than left neural foramina at T4-5 and T5-6 with  associated foraminal stenosis. There is paravertebral soft tissue tumor at T5. There is a 6 mm T2 hyperintense enhancing focus along the T6 superior endplate, and there is a faint T2 hyperintense and mildly enhancing focus in the inferior T12 vertebral body measuring 8 mm. No epidural tumor is identified elsewhere in the thoracic spine. Thoracic vertebral body heights are preserved elsewhere, and there is no significant thoracic spine degenerative change. Mild dependent atelectasis is present in the lungs. MRI LUMBAR SPINE: Vertebral alignment is normal. Vertebral body heights and intervertebral disc space heights are preserved. There is diffuse disc desiccation with mild multilevel type 2 degenerative endplate marrow changes. A 1 cm lesion in the L5 vertebral body is favored to represent a hemangioma. There are 6 mm T2 hyperintense, mildly enhancing foci along the L1 and L2 inferior endplates. No destructive osseous lesion or epidural tumor is seen in the lumbar spine. Conus medullaris is normal in signal and terminates at L1-2. There is no significant disc or facet degeneration, and there is no lumbar stenosis. IMPRESSION: 1. Large, destructive T5 lesion with severe pathologic compression fracture and extensive epidural tumor. Severe spinal stenosis with moderate cord compression. 2. Posterior element tumor involvement at T4. 3. Small, scattered foci of mild enhancement elsewhere in the thoracic and lumbar spine as above, indeterminate with considerations including degenerative changes and small metastases. 4. No evidence of acute intracranial abnormality or intracranial metastatic disease. 5. No evidence of metastatic disease in the cervical spine. Critical Value/emergent results were called by telephone at the time of interpretation on 02/14/2015 at 5:55 pm to Dr. Deno Etienne , who verbally acknowledged these results. Electronically Signed   By: Logan Bores M.D.   On: 02/14/2015 17:57   Ct Abdomen Pelvis W  Contrast  02/06/2015  CLINICAL DATA:  Epigastric pain.  History breast cancer 2014 EXAM: CT ABDOMEN AND PELVIS WITH CONTRAST TECHNIQUE: Multidetector CT imaging of the abdomen and pelvis was performed using the standard protocol following bolus administration of intravenous contrast. CONTRAST:  117m ISOVUE-300 IOPAMIDOL (ISOVUE-300) INJECTION 61% COMPARISON:  None. FINDINGS: Lower chest: Right mastectomy. Bibasilar atelectasis. Heart size within normal limits. Hepatobiliary: Normal liver.  Normal gallbladder and bile ducts. Pancreas: Atrophic pancreas.  Negative for mass or edema. Spleen: Negative Adrenals/Urinary Tract: Negative Stomach/Bowel: Negative for bowel obstruction. No bowel edema. Appendix not visualized. Negative for diverticulitis. Negative for ventral hernia. Vascular/Lymphatic: Normal aorta and iliac vessels.  Normal IVC. Reproductive: Retroverted uterus without mass or enlargement. No adnexal mass. Other: Negative for free fluid.  No adenopathy. Musculoskeletal: No bony lesion or fracture. IMPRESSION: No acute abnormality no cause for pain identified. Electronically Signed   By: CFranchot GalloM.D.   On: 02/06/2015 16:10   Dg C-arm Gt 120 Min  02/16/2015  CLINICAL DATA:  Surgical fixation of thoracic spine EXAM: OPERATIVE THORACIC SPINE 2 VIEW(S) COMPARISON:  CT thoracic spine 02/15/2015 FINDINGS: Intraoperative fluoroscopy is utilized for surgical control purposes. Fluoroscopy time is recorded at 2 minutes 10 seconds. Exposure dose is not  indicated. Postoperative changes with posterior rod and screw fixation of the upper thoracic spine bridging the destructive lesion demonstrated at T5 on previous CT scan. Thoracic spine alignment appears grossly intact. Enteric and endotracheal tubes are noted. IMPRESSION: Intraoperative fluoroscopy utilized for surgical control purposes demonstrating posterior fixation of the upper thoracic spine. Electronically Signed   By: Lucienne Capers M.D.   On:  02/16/2015 01:22     PERTINENT LAB RESULTS: CBC:  Recent Labs  02/17/15 0441 02/18/15 0659  WBC 8.2 6.1  HGB 9.5* 9.7*  HCT 28.8* 29.5*  PLT 187 185   CMET CMP     Component Value Date/Time   NA 137 02/18/2015 0659   NA 138 11/14/2013 1316   K 4.2 02/18/2015 0659   K 4.2 11/14/2013 1316   CL 96* 02/18/2015 0659   CL 101 10/25/2012 0946   CO2 31 02/18/2015 0659   CO2 29 11/14/2013 1316   GLUCOSE 141* 02/18/2015 0659   GLUCOSE 99 11/14/2013 1316   GLUCOSE 90 10/25/2012 0946   BUN 18 02/18/2015 0659   BUN 12.9 11/14/2013 1316   CREATININE 0.77 02/18/2015 0659   CREATININE 1.2* 11/14/2013 1316   CALCIUM 9.4 02/18/2015 0659   CALCIUM 10.1 11/14/2013 1316   PROT 5.7* 02/17/2015 0441   PROT 7.3 11/14/2013 1316   ALBUMIN 3.0* 02/17/2015 0441   ALBUMIN 4.1 11/14/2013 1316   AST 47* 02/17/2015 0441   AST 22 11/14/2013 1316   ALT 29 02/17/2015 0441   ALT 18 11/14/2013 1316   ALKPHOS 60 02/17/2015 0441   ALKPHOS 75 11/14/2013 1316   BILITOT 0.5 02/17/2015 0441   BILITOT 0.54 11/14/2013 1316   GFRNONAA >60 02/18/2015 0659   GFRAA >60 02/18/2015 0659    GFR Estimated Creatinine Clearance: 57.3 mL/min (by C-G formula based on Cr of 0.77). No results for input(s): LIPASE, AMYLASE in the last 72 hours. No results for input(s): CKTOTAL, CKMB, CKMBINDEX, TROPONINI in the last 72 hours. Invalid input(s): POCBNP No results for input(s): DDIMER in the last 72 hours. No results for input(s): HGBA1C in the last 72 hours. No results for input(s): CHOL, HDL, LDLCALC, TRIG, CHOLHDL, LDLDIRECT in the last 72 hours. No results for input(s): TSH, T4TOTAL, T3FREE, THYROIDAB in the last 72 hours.  Invalid input(s): FREET3  Recent Labs  02/17/15 1001  VITAMINB12 615  FOLATE 20.4  FERRITIN 65  TIBC 267  IRON 29  RETICCTPCT 1.4   Coags: No results for input(s): INR in the last 72 hours.  Invalid input(s): PT Microbiology: No results found for this or any previous visit  (from the past 240 hour(s)).   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 Patient underwent decompression by neurosurgery on 02/16/15. Seen in consultation by oncology-given prior history of breast cancer-concern for metastatic disease. Pathology still pending at the time of discharge. Seen by PT/OT-recommendations are to transfer to CIR when bed available. Patient will need follow-up by neurosurgery, oncology. May even need radiation oncology if biopsy positive. Per oncology, will likely need outpatient PET scan and restaging and possible radiation treatments once discharged from CIR. Stable to transfer to CIR when bed available.  Active Problems: Pathologic fracture of T5 thoracic vertebra: Secondary to above.  Postop acute blood loss anemia: Mild blood loss anemia-hemoglobin appears stable  at this point. Stable for outpatient monitoring.  Essential hypertension: Continue Aldactone-BP stable. Follow optimize accordingly  History of breast cancer: Status post right mastectomy-continue Femara-oncology consulted during this hospital stay  Dyslipidemia: Statin  Hypokalemia: Repleted-please recheck electrolytes in the next few weeks.   TODAY-DAY OF DISCHARGE:  Subjective:   Elexa Kivi today has no headache,no chest abdominal pain,no new weakness tingling or numbness, feels much better wants to go home today.   Objective:   Blood pressure 114/50, pulse 78, temperature 97.8 F (36.6 C), temperature source Oral, resp. rate 20, height '5\' 4"'$  (1.626 m), weight 58.968 kg (130 lb), SpO2 99 %.  Intake/Output Summary (Last 24 hours) at 02/18/15 1113 Last data filed at 02/18/15 0806  Gross per 24 hour  Intake    363 ml    Output   1500 ml  Net  -1137 ml   Filed Weights   02/14/15 1331  Weight: 58.968 kg (130 lb)    Exam Awake Alert, Oriented *3, No new F.N deficits, Normal affect [place].AT,PERRAL Supple Neck,No JVD, No cervical lymphadenopathy appriciated.  Symmetrical Chest wall movement, Good air movement bilaterally, CTAB RRR,No Gallops,Rubs or new Murmurs, No Parasternal Heave +ve B.Sounds, Abd Soft, Non tender, No organomegaly appriciated, No rebound -guarding or rigidity. No Cyanosis, Clubbing or edema, No new Rash or bruise  DISCHARGE CONDITION: Stable  DISPOSITION: CIR  DISCHARGE INSTRUCTIONS:    Activity:  As tolerated with Full fall precautions use walker/cane & assistance as needed  Get Medicines reviewed and adjusted: Please take all your medications with you for your next visit with your Primary MD  Please request your Primary MD to go over all hospital tests and procedure/radiological results at the follow up, please ask your Primary MD to get all Hospital records sent to his/her office.  If you experience worsening of your admission symptoms, develop shortness of breath, life threatening emergency, suicidal or homicidal thoughts you must seek medical attention immediately by calling 911 or calling your MD immediately  if symptoms less severe.  You must read complete instructions/literature along with all the possible adverse reactions/side effects for all the Medicines you take and that have been prescribed to you. Take any new Medicines after you have completely understood and accpet all the possible adverse reactions/side effects.   Do not drive when taking Pain medications.   Do not take more than prescribed Pain, Sleep and Anxiety Medications  Special Instructions: If you have smoked or chewed Tobacco  in the last 2 yrs please stop smoking, stop any regular Alcohol  and or any Recreational drug use.  Wear Seat belts while driving.  Please note  You were cared for by a  hospitalist during your hospital stay. Once you are discharged, your primary care physician will handle any further medical issues. Please note that NO REFILLS for any discharge medications will be authorized once you are discharged, as it is imperative that you return to your primary care physician (or establish a relationship with a primary care physician if you do not have one) for your aftercare needs so that they can reassess your need for medications and monitor your lab values.  Diet recommendation: Heart Healthy diet   Discharge Instructions    Call MD for:  redness, tenderness, or signs of infection (pain, swelling, redness, odor or green/yellow discharge around incision site)    Complete by:  As directed      Diet - low sodium heart healthy    Complete by:  As directed  Increase activity slowly    Complete by:  As directed            Follow-up Information    Follow up with Vidal Schwalbe, MD. Schedule an appointment as soon as possible for a visit in 2 weeks.   Specialty:  Family Medicine   Why:  After discharge from [facility] information:   [place] [id number] [phone]       Follow up with Rulon Eisenmenger, MD. Schedule an appointment as soon as possible for a visit in 2 weeks.   Specialty:  Hematology and Oncology   Contact information:   [place] [id number] [phone]       Total Time spent on discharge equals 45 minutes.  SignedOren Binet 02/18/2015 11:13 AM

## 2015-02-18 NOTE — Care Management Note (Signed)
Case Management Note  Patient Details  Name: CLYDA SMYTH MRN: [id number] Date of Birth: [birth year]  Subjective/Objective:  Patient is being followed by CIR Admission Coordinator as recommended by PT/OT evaluation. CM will continue to follow for final disposition and any other CM needs that may arise.                  Action/Plan:Will continue to follow.   Expected Discharge Date:                  Expected Discharge Plan:  [place]  In-House Referral:     Discharge planning Services  CM Consult  Post Acute Care Choice:    Choice offered to:     DME Arranged:    DME Agency:     HH Arranged:    [place] Agency:     Status of Service:  In process, will continue to follow  Medicare Important Message Given:  Yes-second notification given Date Medicare IM Given:    Medicare IM give by:    Date Additional Medicare IM Given:    Additional Medicare Important Message give by:     If discussed at [place] of Stay Meetings, dates discussed:    Additional Comments:  Delrae Sawyers, RN 02/18/2015, 9:40 AM

## 2015-02-18 NOTE — Progress Notes (Signed)
[name], MD Physician Signed Physical Medicine and Rehabilitation PMR Pre-admission 02/18/2015 10:15 AM  Related encounter: ED to Hosp-Admission (Current) from 02/14/2015 in [place] Collapse All   PMR Admission Coordinator Pre-Admission Assessment  Patient: Heather [name] is an 69 y.o., female MRN: [id number] DOB: [birth year] Height: '5\' 4"'$  (162.6 cm) Weight: 58.968 kg (130 lb)  Insurance Information HMO: PPO: PCP: IPA: 80/20: OTHER:  PRIMARY: Medicare A and B Policy#: [id number] Subscriber: self CM Name: Phone#: Fax#:  Pre-Cert#: Employer: retired Benefits: Phone #: Name:  Eff. Date: 09/07/10 Deduct: $1288 Out of Pocket Max: Life Max:  CIR: 100% SNF: 100% first 20 days Outpatient: 80% Co-Pay: 20% Home Health: 100% Co-Pay:  DME: 80% Co-Pay: 20% Providers: Pt. choice SECONDARY: BCBS Supplement Policy#: [id number] Subscriber: self CM Name: Phone#: Fax#:  Pre-Cert#: Employer:  Benefits: Phone #: Name:  Eff. Date: Deduct: Out of Pocket Max: Life Max:  CIR: SNF:  Outpatient: Co-Pay:  Home Health: Co-Pay:  DME: Co-Pay:   Medicaid Application Date: Case Manager:  Disability Application Date: Case Worker:   Emergency Contact Information Contact Information    Name Relation Home Work [place] [phone]     [place] Son   [phone]     Current Medical History  Patient Admitting Diagnosis: T5 cord compression/myelopathy from pathologic compression fracture History of Present Illness: Heather [name] is a 69 y.o. right  handed female with history of breast cancer status post mastectomy, lymph node dissection with 28 sessions radiation and chemotherapy therapy 10/25/2012-12/11/2012 followed by [name]. Patient lives with spouse 1 level home with bedroom downstairs one step to entry. Independent with assistive device prior to admission that she been using for the past few days. Presented 02/14/2015 with bilateral lower extremity weakness and numbness around the chest level down 1 week with pain wrapping around the chest and back. MRI imaging revealed infiltration of the T5 vertebrae involving both body and posterior elements causing significant canal compromise and spinal cord compression/myelopathy. Kyphosis at this level due to pathologic fracture of T5 body. Underwent T5 laminectomy, bilateral transpedicular decompression of thecal sac, T3-T7 nonsegmental pedicle screw stabilization 02/15/2015 per Dr. Kathyrn Sheriff. Hospital course complicated by pain management, hyponatremia, tansaminitis, ABLA . Subcutaneous Lovenox for DVT prophylaxis. Acute blood loss anemia 9.5 and monitored. Physical and occupational therapy evaluations completed 02/17/2015 with recommendations of physical medicine rehabilitation consult. Patient was admitted for a comprehensive rehabilitation program       Past Medical History  Past Medical History  Diagnosis Date  . Chronic kidney disease   . Hx: UTI (urinary tract infection)   . Right shoulder pain   . Right knee pain   . Hypertension     recently started Aldactone   . Hyperlipidemia     but not on meds;diet and exercise controlled  . Dizziness     has been going on for 59month;medical MD aware  . H/O hiatal hernia   . GERD (gastroesophageal reflux disease)     Tums prn  . Hemorrhoids   . Urinary frequency     d/t taking Aldactone  . History of UTI   . Anemia     hx of  . Cataract     immature-not sure of which  eye  . Anxiety     r/t updated surgery  . Insomnia     takes Melatoniin daily  . Complication of anesthesia  pt states she is sensitive to meds  . Breast cancer ([place])     right  . Skin cancer   . Hx of radiation therapy 10/25/12- 12/11/12    right chest wall/regional lymph nodes 5040 cGy, 28 sessions, right chest wall boost 1000 cGy 1 session  . H. pylori infection     Family History  family history includes Cancer in her brother; Colon cancer in her brother and maternal grandmother; Diabetes Mellitus II in her father; Leukemia in her mother; Stroke in her father.  Prior Rehab/Hospitalizations:  Has the patient had major surgery during 100 days prior to admission? No  Current Medications   Current facility-administered medications:  . dexamethasone (DECADRON) injection 4 mg, 4 mg, Intravenous, 4 times per day, Consuella Lose, MD, 4 mg at 02/18/15 0557 . enoxaparin (LOVENOX) injection 30 mg, 30 mg, Subcutaneous, Q24H, Consuella Lose, MD, 30 mg at 02/17/15 2000 . famotidine (PEPCID) tablet 20 mg, 20 mg, Oral, BID, Reubin Milan, MD, 20 mg at 02/18/15 0841 . HYDROmorphone (DILAUDID) injection 0.25-0.5 mg, 0.25-0.5 mg, Intravenous, Q5 min PRN, Lorrene Reid, MD, 0.5 mg at 02/16/15 0154 . HYDROmorphone (DILAUDID) injection 0.25-0.5 mg, 0.25-0.5 mg, Intravenous, Q5 min PRN, Lorrene Reid, MD, 0.5 mg at 02/16/15 0242 . HYDROmorphone (DILAUDID) injection 1 mg, 1 mg, Intravenous, Q2H PRN, Consuella Lose, MD, 1 mg at 02/16/15 0557 . letrozole [facility]) tablet 2.5 mg, 2.5 mg, Oral, Daily, Reubin Milan, MD, 2.5 mg at 02/18/15 1114 . meperidine (DEMEROL) injection 6.25-12.5 mg, 6.25-12.5 mg, Intravenous, Q5 min PRN, Lorrene Reid, MD . methocarbamol (ROBAXIN) tablet 500 mg, 500 mg, Oral, Q8H PRN, Reubin Milan, MD, 500 mg at 02/16/15 2307 . ondansetron (ZOFRAN) tablet 4 mg, 4 mg, Oral, Q6H PRN **OR** ondansetron (ZOFRAN) injection  4 mg, 4 mg, Intravenous, Q6H PRN, Reubin Milan, MD . oxyCODONE-acetaminophen (PERCOCET/ROXICET) 5-325 MG per tablet 1-2 tablet, 1-2 tablet, Oral, Q4H PRN, Consuella Lose, MD, 2 tablet at 02/18/15 0841 . polyethylene glycol (MIRALAX / GLYCOLAX) packet 17 g, 17 g, Oral, Daily PRN, Reubin Milan, MD, 17 g at 02/17/15 2112 . sodium chloride 0.9 % injection 3 mL, 3 mL, Intravenous, Q12H, Reubin Milan, MD, 3 mL at 02/17/15 2359 . spironolactone (ALDACTONE) tablet 12.5 mg, 12.5 mg, Oral, Daily, Reubin Milan, MD, 12.5 mg at 02/18/15 0841 . traMADol (ULTRAM) tablet 50 mg, 50 mg, Oral, Q4H PRN, Reubin Milan, MD, 50 mg at 02/15/15 0753  Patients Current Diet: Diet regular Room service appropriate?: Yes; Fluid consistency:: Thin Diet - low sodium heart healthy  Precautions / Restrictions Precautions Precautions: Fall, Back Precaution Booklet Issued: No Precaution Comments: Discussed back precautions with pt Restrictions Weight Bearing Restrictions: No   Has the patient had 2 or more falls or a fall with injury in the past year?No falls PTA but pt. says balance had decreased  Prior Activity Level Community (5-7x/wk): Pt. reports she is quite acitve , works out at [employer] a minimum of 4 days per week, gardens, does won housework, grocery shops, crochets. Was driving until 2 days prior to admission  Home Assistive Devices / Equipment Home Equipment: Walker - 2 wheels, Bedside commode, Shower seat  Prior Device Use: Indicate devices/aids used by the patient prior to current illness, exacerbation or injury? Pt. Had begun to use RW just 4 days prior to this admission due to progressive weakness and balance issues  Prior Functional Level Prior Function Level of Independence: Independent with assistive device(s) Comments: (used RW for  several days PTA)  Self Care: Did the patient need help bathing, dressing, using the toilet or eating? Independent  Indoor  Mobility: Did the patient need assistance with walking from room to room (with or without device)? Independent  Stairs: Did the patient need assistance with internal or external stairs (with or without device)? Independent  Functional Cognition: Did the patient need help planning regular tasks such as shopping or remembering to take medications? Independent  Current Functional Level Cognition  Overall Cognitive Status: Within Functional Limits for tasks assessed Orientation Level: Oriented X4   Extremity Assessment (includes Sensation/Coordination)  Upper Extremity Assessment: Overall WFL for tasks assessed  Lower Extremity Assessment: Defer to PT evaluation RLE Deficits / Details: Decreased strength greater than L RLE Sensation: decreased light touch, decreased proprioception RLE Coordination: decreased fine motor, decreased gross motor (Heel to shin coordination less than L side) LLE Deficits / Details: Decreased strength, less than R LLE Sensation: decreased light touch, decreased proprioception LLE Coordination: decreased fine motor, decreased gross motor (Heel to shin coordination greater than R side)    ADLs  Overall ADL's : Needs assistance/impaired Upper Body Dressing : Set up, Supervision/safety, Sitting Lower Body Dressing: Sit to/from stand, With adaptive equipment, Moderate assistance Toilet Transfer: +2 for safety/equipment, Minimal assistance, Ambulation, RW (sit to stand from bed) General ADL Comments: Educated on AE and discussed LB dressing technique.     Mobility  Overal bed mobility: Needs Assistance Bed Mobility: Rolling, Sidelying to Sit Rolling: Supervision Sidelying to sit: Min guard General bed mobility comments: HOB flat, use of rails. Pt was able to demonstrate log roll technique with close guard for safety as pt elevated trunk to full sitting position. VC's throughout transfer for sequencing and technique.     Transfers  Overall transfer  level: Needs assistance Equipment used: Rolling walker (2 wheeled) Transfers: Sit to/from Stand Sit to Stand: Min assist, +2 physical assistance General transfer comment: Pt was able to power-up to full standing position with assist for balance/support. +2 available for safety.     Ambulation / Gait / Stairs / Wheelchair Mobility  Ambulation/Gait Ambulation/Gait assistance: Mod assist, Max assist, +2 safety/equipment Ambulation Distance (Feet): 50 Feet Assistive device: Rolling walker (2 wheeled) Gait Pattern/deviations: Step-to pattern, Decreased stride length, Steppage, Ataxic, Trunk flexed, Narrow base of support, Scissoring General Gait Details: Pt required mod assist for balance support, increasing to max assist at times as pt fatigued, and chair follow was utilized. Noted grossly ataxic steps with occasional scissoring and frequent tandem steps. Almost constant cueing required for sequencing and technique with each step. 1 seated rest break required.  Gait velocity: Decreased Gait velocity interpretation: Below normal speed for age/gender    Posture / Balance Balance Overall balance assessment: Needs assistance Sitting-balance support: Feet supported, No upper extremity supported Sitting balance-Leahy Scale: Fair Standing balance support: Bilateral upper extremity supported, During functional activity Standing balance-Leahy Scale: Poor    Special needs/care consideration BiPAP/CPAP no CPM no Continuous Drip IV no Dialysis Days Life Vest no Oxygen Yes, 3L via nasal cannula  Special Bed no  Skin Thoracic surgical incision Location Bowel mgmt: 02/14/15 per pt., small BM Bladder mgmt: foley catheter Diabetic mgmt no     Previous Home Environment Living Arrangements: Spouse/significant other Available Help at Discharge: Family, Available 24 hours/day Type of Home: House Home Layout: Able to live on main level with  bedroom/bathroom, Two level Home Access: Stairs to enter Entrance Stairs-Rails: None Entrance Stairs-Number of Steps: 1 Bathroom Shower/Tub: Holiday representative  Accessibility: Yes Home Care Services: No  Discharge Living Setting Plans for Discharge Living Setting: Patient's home Type of Home at Discharge: House Discharge Home Layout: Two level, Able to live on main level with bedroom/bathroom Discharge Home Access: Stairs to enter Entrance Stairs-Number of Steps: 1 Discharge Bathroom Shower/Tub: Walk-in shower Discharge Bathroom Toilet: Standard Discharge Bathroom Accessibility: Yes How Accessible: Accessible via walker Does the patient have any problems obtaining your medications?: No  Social/Family/Support Systems Patient Roles: Spouse, Parent Anticipated Caregiver: Husband, Heather [name], retired Anticipated Ambulance person Information: Lillie Ability/Limitations of Caregiver: no limitations; pt. States she does not believe her husband "gets" the gravity and life altering effects of her current situation.  Caregiver Availability: 24/7 Discharge Plan Discussed with Primary Caregiver: Yes Is Caregiver In Agreement with Plan?: Yes Does Caregiver/Family have Issues with Lodging/Transportation while Pt is in Rehab?: No   Goals/Additional Needs Patient/Family Goal for Rehab: mod I and supervision with PT and OT; n/a SLP Expected length of stay: 12-14 days Cultural Considerations: "[facility]" Dietary Needs: regular diet with thin liquids Equipment Needs: TBA Additional Information: Per pt's oncologist, Dr. Arther Abbott, pt. to have radiation oncology consult to discuss role of palliateive radiation in a few weeks.  Pt/Family Agrees to Admission and willing to participate: Yes Program Orientation Provided & Reviewed with Pt/Caregiver Including Roles & Responsibilities: Yes   Decrease burden of Care through IP rehab admission: no   Possible need for SNF  placement upon discharge: Not anticipated   Patient Condition: This patient's condition remains as documented in the consult dated 02/17/15, in which the Rehabilitation Physician determined and documented that the patient's condition is appropriate for intensive rehabilitative care in an inpatient rehabilitation facility. Will admit to inpatient rehab today.  Preadmission Screen Completed By: Gerlean Ren, 02/18/2015 2:44 PM ______________________________________________________________________  Discussed status with Dr. Posey Pronto on 02/18/15 at 1450 and received telephone approval for admission today.  Admission Coordinator: Gerlean Ren, time 7517 /Date 02/18/15          Revision History

## 2015-02-18 NOTE — Progress Notes (Signed)
Physical Therapy Treatment Patient Details Name: Heather [name] MRN: [id number] DOB: [birth year] Today's Date: 02/18/2015    History of Present Illness Pt is a 69 y/o female with a PMH significant for breast CA s/p mastectomy. Pt presents with LE paralysis and sensory deficit below her chest. Symptoms began several months ago with rapid onset ~4 days PTA. MRI revealed T5 cord compression from pathologic compression fracture in addition to tumor compromising the spinal cord. Pt emergently underwent decompression laminectomy with stabilization of T3-T7 vertebrae on 10/10.     PT Comments    Pt progressing towards physical therapy goals. Appears somewhat depressed at beginning of session and acknowledges that she may have had unrealistic expectations for recovery. Pt was reminded of short term goals for physical therapy and encouraged pt to focus on a goal of increasing OOB endurance (sitting in chair for all meals) today. Is anxious to start working on ADL's and continue gait training with therapy. Pt reports that she is hoping to move to CIR today. Will continue to follow.    Follow Up Recommendations  CIR;Supervision/Assistance - 24 hour     Equipment Recommendations  Wheelchair (measurements PT);Wheelchair cushion (measurements PT)    Recommendations for Other Services Rehab consult     Precautions / Restrictions Precautions Precautions: Fall;Back Precaution Comments: Discussed back precautions with pt Restrictions Weight Bearing Restrictions: No    Mobility  Bed Mobility Overal bed mobility: Needs Assistance Bed Mobility: Rolling;Sidelying to Sit Rolling: Supervision Sidelying to sit: Min guard       General bed mobility comments: HOB flat, use of rails. Pt was able to demonstrate log roll technique with close guard for safety as pt elevated trunk to full sitting position. VC's throughout transfer for sequencing and technique.   Transfers Overall transfer level: Needs  assistance Equipment used: Rolling walker (2 wheeled) Transfers: Sit to/from Stand Sit to Stand: Min assist;+2 physical assistance         General transfer comment: Pt was able to power-up to full standing position with assist for balance/support. +2 available for safety.   Ambulation/Gait Ambulation/Gait assistance: Mod assist;Max assist;+2 safety/equipment Ambulation Distance (Feet): 50 Feet Assistive device: Rolling walker (2 wheeled) Gait Pattern/deviations: Step-to pattern;Decreased stride length;Steppage;Ataxic;Trunk flexed;Narrow base of support;Scissoring Gait velocity: Decreased Gait velocity interpretation: Below normal speed for age/gender General Gait Details: Pt required mod assist for balance support, increasing to max assist at times as pt fatigued, and chair follow was utilized. Noted grossly ataxic steps with occasional scissoring and frequent tandem steps. Almost constant cueing required for sequencing and technique with each step. 1 seated rest break required.    Stairs            Wheelchair Mobility    Modified Rankin (Stroke Patients Only)       Balance Overall balance assessment: Needs assistance Sitting-balance support: Feet supported;No upper extremity supported Sitting balance-Leahy Scale: Fair     Standing balance support: Bilateral upper extremity supported;During functional activity Standing balance-Leahy Scale: Poor                      Cognition Arousal/Alertness: Awake/alert Behavior During Therapy: WFL for tasks assessed/performed Overall Cognitive Status: Within Functional Limits for tasks assessed                      Exercises      General Comments        Pertinent Vitals/Pain Pain Assessment: 0-10 Pain Score: 3  Pain Location: incision  site Pain Descriptors / Indicators: Operative site guarding;Grimacing Pain Intervention(s): Limited activity within patient's tolerance;Monitored during  session;Repositioned    Home Living                      Prior Function        Comments:  (used RW for several days PTA)   PT Goals (current goals can now be found in the care plan section) Acute Rehab PT Goals Patient Stated Goal: Return to PLOF PT Goal Formulation: With patient Time For Goal Achievement: 02/24/15 Potential to Achieve Goals: Good Progress towards PT goals: Progressing toward goals    Frequency  Min 5X/week    PT Plan Frequency needs to be updated    Co-evaluation PT/OT/SLP Co-Evaluation/Treatment: Yes           End of Session Equipment Utilized During Treatment: Gait belt Activity Tolerance: Patient tolerated treatment well Patient left: in chair;with call bell/phone within reach     Time: 0828-0909 PT Time Calculation (min) (ACUTE ONLY): 41 min  Charges:  $Gait Training: 23-37 mins $Therapeutic Activity: 8-22 mins                    G Codes:      Rolinda Roan 19-Mar-2015, 10:53 AM   Rolinda Roan, PT, DPT Acute Rehabilitation Services Pager: [phone]

## 2015-02-18 NOTE — H&P (View-Only) (Signed)
Physical Medicine and Rehabilitation Admission H&P    Chief Complaint  Patient presents with  . Paraparesis     Due to T5 compression with myelopathy due to metastatic breastcancer    HPI: Heather [name] is a 69 y.o. right handed female with history of HTN, CKD, recent gastritis,  R-breast cancer s/p mastectomy, with chemo/XRT 2013 with neuropathy and adjuvant therapy with letrozole.  She was admitted on 02/14/2015 with reports of numbness from chest down with pain since July and one week history of BLE weakness, balance problems progressing to inability to walk, difficulty voiding and constipation.  MRI brain without evidence of metastatic disease. MRI C-T-L-spine with large destructive T5 lesion with severe pathologic compression and extensive epidural tumor as well as indeterminate scattered foci of enhancement in thoracic and lumbar spine.  She was evaluated by Dr. Kathyrn Sheriff who recommended decompressive surgery. She was started on decadron and underwent T5 laminectomy with decompression and T3-T5 screw stabilization on 02/16/15.  [name] consulted for input and recommended palliative radiation to spine as well as PET CT for re-staging after discharge.  Pathology positive for metastatic cancer due to breast primary. Hospital course complicated by pain management, hyponatremia, transaminitis, ABLA . Subcutaneous Lovenox for DVT prophylaxis. Acute blood loss anemia 9.5 and monitored.  Post op with ABLA and has had improvement in BLE strength.  PT/OT ongoing and CIR recommended for follow up therapy. Pt lives in a 1 story home with her husband, who is able to provide superversion, but only partial physical support. He has had therapy before when she was not able to use her hand and had great results, so she hoping to gain the same benefit with IPR.   Review of Systems  Constitutional: Positive for weight loss (in past two months due to gastritis).  HENT: Negative for hearing loss and tinnitus.    Respiratory: Negative for cough and shortness of breath.   Cardiovascular: Positive for chest pain (Severe bandlike pain from back that encircles"squeezing" the chest at times.  Neuropathy left chest wall/masectomy site). Negative for leg swelling.  Gastrointestinal: Positive for heartburn and constipation (ongoing for about 2 weeks). Negative for nausea and vomiting.  Genitourinary: Positive for urgency.       New onset of incontinence few days PTA  Musculoskeletal: Positive for myalgias and back pain.  Neurological: Positive for sensory change (numb from chest down), focal weakness, weakness and headaches. Negative for dizziness and tingling.  Psychiatric/Behavioral: Negative for depression and memory loss. The patient is nervous/anxious.       Past Medical History  Diagnosis Date  . Chronic kidney disease   . Hx: UTI (urinary tract infection)   . Right shoulder pain   . Right knee pain   . Hypertension     recently started Aldactone   . Hyperlipidemia     but not on meds;diet and exercise controlled  . Dizziness     has been going on for 71month;medical MD aware  . H/O hiatal hernia   . GERD (gastroesophageal reflux disease)     Tums prn  . Hemorrhoids   . Urinary frequency     d/t taking Aldactone  . History of UTI   . Anemia     hx of  . Cataract     immature-not sure of which eye  . Anxiety     r/t updated surgery  . Insomnia     takes Melatoniin daily  . Complication of anesthesia  pt states she is sensitive to meds  . Breast cancer ([place])     right  . Skin cancer   . Hx of radiation therapy 10/25/12- 12/11/12    right chest wall/regional lymph nodes 5040 cGy, 28 sessions, right chest wall boost 1000 cGy 1 session  . H. pylori infection     Past Surgical History  Procedure Laterality Date  . Cyst removed from left breast  1970  . Left wrist surgery   2004    with plate  . Colonoscopy    . Esophagogastroduodenoscopy    . Mastectomy, radical    .  Mastectomy modified radical  05/10/2012    Procedure: MASTECTOMY MODIFIED RADICAL;  Surgeon: Merrie Roof, MD;  Location: [facility];  Service: General;  Laterality: Right;  RIGHT MODIFIED RADICAL MASTECTOMY  . Portacath placement  05/10/2012    Procedure: INSERTION PORT-A-CATH;  Surgeon: Merrie Roof, MD;  Location: [facility];  Service: General;  Laterality: Left;  . Decompressive lumbar laminectomy level 4 N/A 02/15/2015    Procedure: DECOMPRESSION T5 AND T3-T7 STABALIZATION;  Surgeon: Consuella Lose, MD;  Location: [facility];  Service: Neurosurgery;  Laterality: N/A;    Family History  Problem Relation Age of Onset  . Leukemia Mother   . Colon cancer Brother   . Cancer Brother     Prostate  . Colon cancer Maternal Grandmother   . Stroke Father   . Diabetes Mellitus II Father     Social History:  Married. 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 She reports that she has never smoked. She has never used smokeless tobacco. She reports that she does not drink alcohol or use illicit drugs.    Allergies  Allergen Reactions  . Codeine     " loopy"  . Keflex [Cephalexin]     GI Upset  . Naprosyn [Naproxen] Other (See Comments)    GI ipset   . Tussin [Guaifenesin] Other (See Comments)    Dizzy     Medications Prior to Admission  Medication Sig Dispense Refill  . B Complex-C (SUPER B COMPLEX PO) Take 1 each by mouth daily.    Marland Kitchen letrozole (FEMARA) 2.5 MG tablet Take 1 tablet (2.5 mg total) by mouth daily. 90 tablet 3  . methocarbamol (ROBAXIN) 500 MG tablet Take 500 mg by mouth daily as needed for muscle spasms.    . polyethylene glycol powder (GLYCOLAX/MIRALAX) powder Take 1 Container by mouth daily as needed for mild constipation.    . Probiotic Product (PROBIOTIC DAILY PO) Take 1 capsule by mouth daily.     . ranitidine (ZANTAC) 300 MG tablet Take 300 mg by mouth daily.    Marland Kitchen spironolactone (ALDACTONE) 25 MG tablet Take 0.5 tablets (12.5 mg total) by mouth daily. 45 tablet  12  . traMADol (ULTRAM) 50 MG tablet Take 50 mg by mouth every 6 (six) hours as needed for moderate pain.    Marland Kitchen UNABLE TO FIND Rx: L8000- Post Surgical Bra (Quantity: 6) P7948- Silicone Breast Prosthesis (Quantity: 1) Dx: 174.9; Right mastectomy 1 each 0  . furosemide (LASIX) 20 MG tablet Take 1 tablet (20 mg total) by mouth daily as needed. (Patient not taking: Reported on 01/11/2015) 30 tablet 3  . pravastatin (PRAVACHOL) 10 MG tablet Take 10 mg by mouth daily.       Home: Home Living Family/patient expects to be discharged to:: Private residence Living Arrangements: Spouse/significant other Available Help at Discharge: Family, Available 24 hours/day Type of  Home: House Home Access: Stairs to enter [employer] of Steps: 1 Entrance Stairs-Rails: None Home Layout: Able to live on main level with bedroom/bathroom, Two level Bathroom Shower/Tub: Holiday representative Accessibility: Yes Home Equipment: Environmental consultant - 2 wheels, Bedside commode, Shower seat   Functional History: Prior Function Level of Independence: Independent with assistive device(s) Comments:  (used RW for several days PTA)  Functional Status:  Mobility: Bed Mobility Overal bed mobility: Needs Assistance Bed Mobility: Rolling, Sidelying to Sit Rolling: Supervision Sidelying to sit: Min guard General bed mobility comments: HOB flat, use of rails. Pt was able to demonstrate log roll technique with close guard for safety as pt elevated trunk to full sitting position. VC's throughout transfer for sequencing and technique.  Transfers Overall transfer level: Needs assistance Equipment used: Rolling walker (2 wheeled) Transfers: Sit to/from Stand Sit to Stand: Min assist, +2 physical assistance General transfer comment: Pt was able to power-up to full standing position with assist for balance/support. +2 available for safety.  Ambulation/Gait Ambulation/Gait assistance: Mod assist, Max assist, +2  safety/equipment Ambulation Distance (Feet): 50 Feet Assistive device: Rolling walker (2 wheeled) Gait Pattern/deviations: Step-to pattern, Decreased stride length, Steppage, Ataxic, Trunk flexed, Narrow base of support, Scissoring General Gait Details: Pt required mod assist for balance support, increasing to max assist at times as pt fatigued, and chair follow was utilized. Noted grossly ataxic steps with occasional scissoring and frequent tandem steps. Almost constant cueing required for sequencing and technique with each step. 1 seated rest break required.  Gait velocity: Decreased Gait velocity interpretation: Below normal speed for age/gender    ADL: ADL Overall ADL's : Needs assistance/impaired Upper Body Dressing : Set up, Supervision/safety, Sitting Lower Body Dressing: Sit to/from stand, With adaptive equipment, Moderate assistance Toilet Transfer: +2 for safety/equipment, Minimal assistance, Ambulation, RW (sit to stand from bed) General ADL Comments: Educated on AE and discussed LB dressing technique.   Cognition: Cognition Overall Cognitive Status: Within Functional Limits for tasks assessed Orientation Level: Oriented X4 Cognition Arousal/Alertness: Awake/alert Behavior During Therapy: WFL for tasks assessed/performed Overall Cognitive Status: Within Functional Limits for tasks assessed   Blood pressure 114/50, pulse 78, temperature 97.8 F (36.6 C), temperature source Oral, resp. rate 20, height '5\' 4"'  (1.626 m), weight 58.968 kg (130 lb), SpO2 99 %. Physical Exam  Nursing note and vitals reviewed. Constitutional: She is oriented to person, place, and time. She appears well-developed and well-nourished. No distress.  HENT:  Head: Normocephalic and atraumatic.  Mouth/Throat: Oropharynx is clear and moist.  Eyes: Conjunctivae and EOM are normal. Pupils are equal, round, and reactive to light.  Neck: Normal range of motion. Neck supple.  Cardiovascular: Normal rate and  regular rhythm.   No murmur heard. Respiratory: Effort normal and breath sounds normal.  GI: Soft. Bowel sounds are normal. She exhibits no distension. There is no tenderness.  Musculoskeletal: She exhibits no edema or tenderness.  B/l UE: 4/5 B/l LE 3+/5   Neurological: She is alert and oriented to person, place, and time. She displays abnormal reflex. Coordination (BLE ataxic) abnormal.  Sensation decreased below T-6.  No evidence of spasticity.  No clonus noted.   Skin: Skin is warm and dry. No rash noted. No erythema.  Upper thoracic incision with honeycomb dressing--no redness, drainage or tenderness.     Results for orders placed or performed during the hospital encounter of 02/14/15 (from the past 48 hour(s))  CBC WITH DIFFERENTIAL     Status: Abnormal   Collection Time: 02/17/15  4:41 AM  Result Value Ref Range   WBC 8.2 4.0 - 10.5 K/uL   RBC 3.31 (L) 3.87 - 5.11 MIL/uL   Hemoglobin 9.5 (L) 12.0 - 15.0 g/dL   HCT 28.8 (L) 36.0 - 46.0 %   MCV 87.0 78.0 - 100.0 fL   MCH 28.7 26.0 - 34.0 pg   MCHC 33.0 30.0 - 36.0 g/dL   RDW 12.9 11.5 - 15.5 %   Platelets 187 150 - 400 K/uL   Neutrophils Relative % 89 %   Neutro Abs 7.2 1.7 - 7.7 K/uL   Lymphocytes Relative 6 %   Lymphs Abs 0.5 (L) 0.7 - 4.0 K/uL   Monocytes Relative 5 %   Monocytes Absolute 0.4 0.1 - 1.0 K/uL   Eosinophils Relative 0 %   Eosinophils Absolute 0.0 0.0 - 0.7 K/uL   Basophils Relative 0 %   Basophils Absolute 0.0 0.0 - 0.1 K/uL  Comprehensive metabolic panel     Status: Abnormal   Collection Time: 02/17/15  4:41 AM  Result Value Ref Range   Sodium 133 (L) 135 - 145 mmol/L   Potassium 4.8 3.5 - 5.1 mmol/L    Comment: DELTA CHECK NOTED   Chloride 98 (L) 101 - 111 mmol/L   CO2 28 22 - 32 mmol/L   Glucose, Bld 158 (H) 65 - 99 mg/dL   BUN 14 6 - 20 mg/dL   Creatinine, Ser 0.91 0.44 - 1.00 mg/dL   Calcium 9.0 8.9 - 10.3 mg/dL   Total Protein 5.7 (L) 6.5 - 8.1 g/dL   Albumin 3.0 (L) 3.5 - 5.0 g/dL   AST  47 (H) 15 - 41 U/L   ALT 29 14 - 54 U/L   Alkaline Phosphatase 60 38 - 126 U/L   Total Bilirubin 0.5 0.3 - 1.2 mg/dL   GFR calc non Af Amer >60 >60 mL/min   GFR calc Af Amer >60 >60 mL/min    Comment: (NOTE) The eGFR has been calculated using the CKD EPI equation. This calculation has not been validated in all clinical situations. eGFR's persistently <60 mL/min signify possible Chronic Kidney Disease.    Anion gap 7 5 - 15  Vitamin B12     Status: None   Collection Time: 02/17/15 10:01 AM  Result Value Ref Range   Vitamin B-12 615 180 - 914 pg/mL    Comment: (NOTE) This assay is not validated for testing neonatal or myeloproliferative syndrome specimens for Vitamin B12 levels.   Folate     Status: None   Collection Time: 02/17/15 10:01 AM  Result Value Ref Range   Folate 20.4 >5.9 ng/mL  Iron and TIBC     Status: None   Collection Time: 02/17/15 10:01 AM  Result Value Ref Range   Iron 29 28 - 170 ug/dL   TIBC 267 250 - 450 ug/dL   Saturation Ratios 11 10.4 - 31.8 %   UIBC 238 ug/dL  Ferritin     Status: None   Collection Time: 02/17/15 10:01 AM  Result Value Ref Range   Ferritin 65 11 - 307 ng/mL  Reticulocytes     Status: Abnormal   Collection Time: 02/17/15 10:01 AM  Result Value Ref Range   Retic Ct Pct 1.4 0.4 - 3.1 %   RBC. 3.61 (L) 3.87 - 5.11 MIL/uL   Retic Count, Manual 50.5 19.0 - 186.0 K/uL  CBC     Status: Abnormal   Collection Time: 02/18/15  6:59 AM  Result  Value Ref Range   WBC 6.1 4.0 - 10.5 K/uL   RBC 3.36 (L) 3.87 - 5.11 MIL/uL   Hemoglobin 9.7 (L) 12.0 - 15.0 g/dL   HCT 29.5 (L) 36.0 - 46.0 %   MCV 87.8 78.0 - 100.0 fL   MCH 28.9 26.0 - 34.0 pg   MCHC 32.9 30.0 - 36.0 g/dL   RDW 13.1 11.5 - 15.5 %   Platelets 185 150 - 400 K/uL  Basic metabolic panel     Status: Abnormal   Collection Time: 02/18/15  6:59 AM  Result Value Ref Range   Sodium 137 135 - 145 mmol/L   Potassium 4.2 3.5 - 5.1 mmol/L   Chloride 96 (L) 101 - 111 mmol/L   CO2 31 22  - 32 mmol/L   Glucose, Bld 141 (H) 65 - 99 mg/dL   BUN 18 6 - 20 mg/dL   Creatinine, Ser 0.77 0.44 - 1.00 mg/dL   Calcium 9.4 8.9 - 10.3 mg/dL   GFR calc non Af Amer >60 >60 mL/min   GFR calc Af Amer >60 >60 mL/min    Comment: (NOTE) The eGFR has been calculated using the CKD EPI equation. This calculation has not been validated in all clinical situations. eGFR's persistently <60 mL/min signify possible Chronic Kidney Disease.    Anion gap 10 5 - 15   No results found.     Medical Problem List and Plan: 1. Functional deficits secondary to T5 cord compression/myelopathy from pathologic compression fracture 2.  DVT Prophylaxis/Anticoagulation: Pharmaceutical: Lovenox 3. Pain Management: Will add neurontin to help with neuropathic pain. Continue oxycodone prn.  4. Adjustment reaction with anxiety/ Mood: Ego support provided.  Will add low dose Xanax to help with anxiety attacks. Team to provide ego support. LCSW to follow for evaluation and support.  5. Neuropsych: This patient is capable of making decisions on her own behalf. 6. Skin/Wound Care: Routine pressure relief measures. Maintain adequate nutrition and hydration status.  7. Fluids/Electrolytes/Nutrition: Monitor I/O. Check lytes in am.  8. ABLA: Wants to hold off on iron due to concerns of constipation. Monitor for now--improve po intake. Check CBC in am.  9. HTN: Monitor BID. Continue aldactone daily and titrate as needed.  10. Mild chronic gastritis: Continue  Pepcid bid. Monitor for increase in symptoms with decadron on board.  11. Neurogenic bowel and bladder:  Has not had BM since admission. Start bowel program. Enema today. D/C foley in am and start bladder training. Will check UA/UCS.    Post Admission Physician Evaluation: 1. Functional deficits secondary  to T5 cord compression/myelopathy from pathologic compression fracture 2. Patient is admitted to receive collaborative, interdisciplinary care between the  physiatrist, rehab nursing staff, and therapy team. 3. Patient's level of medical complexity and substantial therapy needs in context of that medical necessity cannot be provided at a lesser intensity of care such as a SNF. 4. Patient has experienced substantial functional loss from his/her baseline which was documented above under the "Functional History" and "Functional Status" headings.  Judging by the patient's diagnosis, physical exam, and functional history, the patient has potential for functional progress which will result in measurable gains while on inpatient rehab.  These gains will be of substantial and practical use upon discharge  in facilitating mobility and self-care at the household level. 5. Physiatrist will provide 24 hour management of medical needs as well as oversight of the therapy plan/treatment and provide guidance as appropriate regarding the interaction of the two. 6. 24  hour rehab nursing will assist with bladder management, bowel management, safety, skin/wound care, disease management, pain management and patient education and help integrate therapy concepts, techniques,education, etc. 7. PT will assess and treat for/with: Lower extremity strength (with emphasis on proprioception), range of motion, stamina, balance, functional mobility, safety, adaptive techniques and equipment, woundcare, coping skills, pain control, education.   Goals are: Mod I/Supervision. 8. OT will assess and treat for/with: ADL's, functional mobility, safety, upper extremity strength, adaptive techniques and equipment, wound mgt, ego support, and community reintegration.   Goals are: Mod I/supervision. Therapy may not proceed with showering this patient. 9. Case Management and Social Worker will assess and treat for psychological issues and discharge planning. 10. Team conference will be held weekly to assess progress toward goals and to determine barriers to discharge. 11. Patient will receive at least  3 hours of therapy per day at least 5 days per week. 12. ELOS: 12-14 days.       13. Prognosis:  excellent and good  Delice Lesch, MD  02/18/2015

## 2015-02-18 NOTE — PMR Pre-admission (Signed)
PMR Admission Coordinator Pre-Admission Assessment  Patient: Heather [name] is an 69 y.o., female MRN: [id number] DOB: 01/31/1946 Height: '5\' 4"'$  (162.6 cm) Weight: 58.968 kg (130 lb)              Insurance Information HMO:     PPO:      PCP:      IPA:      80/20:      OTHER:  PRIMARY:  Medicare A and B      Policy#:  [id number]      Subscriber:  self CM Name:        Phone#:      Fax#:  Pre-Cert#:       Employer: retired Benefits:  Phone #:      Name:  Eff. Date: 09/07/10     Deduct:  $1288      Out of Pocket Max:   Life Max:  CIR: 100%      SNF:  100% first 20 days Outpatient:  80%     Co-Pay: 20% Home Health:  100%      Co-Pay:   DME:  80%     Co-Pay:  20% Providers:  Pt. choice SECONDARY: BCBS Supplement      Policy#:  [id number]      Subscriber:  self CM Name:       Phone#:      Fax#:  Pre-Cert#:       Employer:  Benefits:  Phone #:      Name:  Eff. Date:      Deduct:       Out of Pocket Max:       Life Max:  CIR:       SNF:  Outpatient:      Co-Pay:  Home Health:       Co-Pay:  DME:      Co-Pay:   Medicaid Application Date:       Case Manager:  Disability Application Date:       Case Worker:   Emergency Contact Information Contact Information    Name Relation Home Work [place] [phone]     Dona Ana Son   [phone]     Current Medical History  Patient Admitting Diagnosis: T5 cord compression/myelopathy from pathologic compression fracture History of Present Illness: Heather [name] is a 69 y.o. right handed female with history of breast cancer status post mastectomy, lymph node dissection with 28 sessions radiation and chemotherapy therapy 10/25/2012-12/11/2012 followed by [name]. Patient lives with spouse 1 level home with bedroom downstairs one step to entry. Independent with assistive device prior to admission that she been using for the past few days. Presented 02/14/2015 with bilateral lower extremity weakness and  numbness around the chest level down 1 week with pain wrapping around the chest and back. MRI imaging revealed infiltration of the T5 vertebrae involving both body and posterior elements causing significant canal compromise and spinal cord compression/myelopathy. Kyphosis at this level due to pathologic fracture of T5 body. Underwent T5 laminectomy, bilateral transpedicular decompression of thecal sac, T3-T7 nonsegmental pedicle screw stabilization 02/15/2015 per Dr. Kathyrn Sheriff. Hospital course complicated by pain management, hyponatremia, tansaminitis, ABLA . Subcutaneous Lovenox for DVT prophylaxis. Acute blood loss anemia 9.5 and monitored. Physical and occupational therapy evaluations completed 02/17/2015 with recommendations of physical medicine rehabilitation consult. Patient was admitted for a comprehensive rehabilitation program       Past Medical History  Past Medical History  Diagnosis Date  .  Chronic kidney disease   . Hx: UTI (urinary tract infection)   . Right shoulder pain   . Right knee pain   . Hypertension     recently started Aldactone   . Hyperlipidemia     but not on meds;diet and exercise controlled  . Dizziness     has been going on for 29month;medical MD aware  . H/O hiatal hernia   . GERD (gastroesophageal reflux disease)     Tums prn  . Hemorrhoids   . Urinary frequency     d/t taking Aldactone  . History of UTI   . Anemia     hx of  . Cataract     immature-not sure of which eye  . Anxiety     r/t updated surgery  . Insomnia     takes Melatoniin daily  . Complication of anesthesia     pt states she is sensitive to meds  . [place] cancer [place]     right  . Skin cancer   . Hx of radiation therapy 10/25/12- 12/11/12    right chest wall/regional lymph nodes 5040 cGy, 28 sessions, right chest wall boost 1000 cGy 1 session  . H. pylori infection     Family History  family history includes Cancer in her brother; Colon cancer in her brother and maternal  grandmother; Diabetes Mellitus II in her father; Leukemia in her mother; Stroke in her father.  Prior Rehab/Hospitalizations:  Has the patient had major surgery during 100 days prior to admission? No  Current Medications   Current facility-administered medications:  .  dexamethasone (DECADRON) injection 4 mg, 4 mg, Intravenous, 4 times per day, NConsuella Lose MD, 4 mg at 02/18/15 0557 .  enoxaparin (LOVENOX) injection 30 mg, 30 mg, Subcutaneous, Q24H, NConsuella Lose MD, 30 mg at 02/17/15 2000 .  famotidine (PEPCID) tablet 20 mg, 20 mg, Oral, BID, DReubin Milan MD, 20 mg at 02/18/15 0841 .  HYDROmorphone (DILAUDID) injection 0.25-0.5 mg, 0.25-0.5 mg, Intravenous, Q5 min PRN, DLorrene Reid MD, 0.5 mg at 02/16/15 0154 .  HYDROmorphone (DILAUDID) injection 0.25-0.5 mg, 0.25-0.5 mg, Intravenous, Q5 min PRN, DLorrene Reid MD, 0.5 mg at 02/16/15 0242 .  HYDROmorphone (DILAUDID) injection 1 mg, 1 mg, Intravenous, Q2H PRN, NConsuella Lose MD, 1 mg at 02/16/15 0557 .  letrozole ([facility] tablet 2.5 mg, 2.5 mg, Oral, Daily, DReubin Milan MD, 2.5 mg at 02/18/15 1114 .  meperidine (DEMEROL) injection 6.25-12.5 mg, 6.25-12.5 mg, Intravenous, Q5 min PRN, DLorrene Reid MD .  methocarbamol (ROBAXIN) tablet 500 mg, 500 mg, Oral, Q8H PRN, DReubin Milan MD, 500 mg at 02/16/15 2307 .  ondansetron (ZOFRAN) tablet 4 mg, 4 mg, Oral, Q6H PRN **OR** ondansetron (ZOFRAN) injection 4 mg, 4 mg, Intravenous, Q6H PRN, DReubin Milan MD .  oxyCODONE-acetaminophen (PERCOCET/ROXICET) 5-325 MG per tablet 1-2 tablet, 1-2 tablet, Oral, Q4H PRN, NConsuella Lose MD, 2 tablet at 02/18/15 0841 .  polyethylene glycol (MIRALAX / GLYCOLAX) packet 17 g, 17 g, Oral, Daily PRN, DReubin Milan MD, 17 g at 02/17/15 2112 .  sodium chloride 0.9 % injection 3 mL, 3 mL, Intravenous, Q12H, DReubin Milan MD, 3 mL at 02/17/15 2359 .  spironolactone (ALDACTONE) tablet 12.5 mg, 12.5 mg, Oral, Daily, DReubin Milan MD, 12.5 mg at 02/18/15 0841 .  traMADol (ULTRAM) tablet 50 mg, 50 mg, Oral, Q4H PRN, DReubin Milan MD, 50 mg at 02/15/15 0753  Patients Current Diet: Diet regular Room service appropriate?: Yes; Fluid  consistency:: Thin Diet - low sodium heart healthy  Precautions / Restrictions Precautions Precautions: Fall, Back Precaution Booklet Issued: No Precaution Comments: Discussed back precautions with pt Restrictions Weight Bearing Restrictions: No   Has the patient had 2 or more falls or a fall with injury in the past year?No falls PTA but pt. says balance had decreased  Prior Activity Level Community (5-7x/wk): Pt. reports she is quite acitve , works out at [employer] a minimum of 4 days per week, gardens, does won housework, grocery shops, crochets.  Was driving until 2 days prior to admission  Home Assistive Devices / Equipment Home Equipment: Walker - 2 wheels, Bedside commode, Shower seat  Prior Device Use: Indicate devices/aids used by the patient prior to current illness, exacerbation or injury? Pt. Had begun to use RW just 4 days prior to this admission due to progressive weakness and balance issues  Prior Functional Level Prior Function Level of Independence: Independent with assistive device(s) Comments:  (used RW for several days PTA)  Self Care: Did the patient need help bathing, dressing, using the toilet or eating?  Independent  Indoor Mobility: Did the patient need assistance with walking from room to room (with or without device)? Independent  Stairs: Did the patient need assistance with internal or external stairs (with or without device)? Independent  Functional Cognition: Did the patient need help planning regular tasks such as shopping or remembering to take medications? Independent  Current Functional Level Cognition  Overall Cognitive Status: Within Functional Limits for tasks assessed Orientation Level: Oriented X4    Extremity  Assessment (includes Sensation/Coordination)  Upper Extremity Assessment: Overall WFL for tasks assessed  Lower Extremity Assessment: Defer to PT evaluation RLE Deficits / Details: Decreased strength greater than L RLE Sensation: decreased light touch, decreased proprioception RLE Coordination: decreased fine motor, decreased gross motor (Heel to shin coordination less than L side) LLE Deficits / Details: Decreased strength, less than R LLE Sensation: decreased light touch, decreased proprioception LLE Coordination: decreased fine motor, decreased gross motor (Heel to shin coordination greater than R side)    ADLs  Overall ADL's : Needs assistance/impaired Upper Body Dressing : Set up, Supervision/safety, Sitting Lower Body Dressing: Sit to/from stand, With adaptive equipment, Moderate assistance Toilet Transfer: +2 for safety/equipment, Minimal assistance, Ambulation, RW (sit to stand from bed) General ADL Comments: Educated on AE and discussed LB dressing technique.     Mobility  Overal bed mobility: Needs Assistance Bed Mobility: Rolling, Sidelying to Sit Rolling: Supervision Sidelying to sit: Min guard General bed mobility comments: HOB flat, use of rails. Pt was able to demonstrate log roll technique with close guard for safety as pt elevated trunk to full sitting position. VC's throughout transfer for sequencing and technique.     Transfers  Overall transfer level: Needs assistance Equipment used: Rolling walker (2 wheeled) Transfers: Sit to/from Stand Sit to Stand: Min assist, +2 physical assistance General transfer comment: Pt was able to power-up to full standing position with assist for balance/support. +2 available for safety.     Ambulation / Gait / Stairs / Wheelchair Mobility  Ambulation/Gait Ambulation/Gait assistance: Mod assist, Max assist, +2 safety/equipment Ambulation Distance (Feet): 50 Feet Assistive device: Rolling walker (2 wheeled) Gait  Pattern/deviations: Step-to pattern, Decreased stride length, Steppage, Ataxic, Trunk flexed, Narrow base of support, Scissoring General Gait Details: Pt required mod assist for balance support, increasing to max assist at times as pt fatigued, and chair follow was utilized. Noted grossly ataxic steps with occasional scissoring  and frequent tandem steps. Almost constant cueing required for sequencing and technique with each step. 1 seated rest break required.  Gait velocity: Decreased Gait velocity interpretation: Below normal speed for age/gender    Posture / Balance Balance Overall balance assessment: Needs assistance Sitting-balance support: Feet supported, No upper extremity supported Sitting balance-Leahy Scale: Fair Standing balance support: Bilateral upper extremity supported, During functional activity Standing balance-Leahy Scale: Poor    Special needs/care consideration BiPAP/CPAP  no CPM  no Continuous Drip IV  no Dialysis        Days Life Vest  no Oxygen  Yes, 3L via nasal cannula  Special Bed  no       Skin   Thoracic surgical incision                             Location Bowel mgmt:  02/14/15 per pt., small BM Bladder mgmt: foley catheter Diabetic mgmt  no     Previous Home Environment Living Arrangements: Spouse/significant other Available Help at Discharge: Family, Available 24 hours/day Type of Home: House Home Layout: Able to live on main level with bedroom/bathroom, Two level Home Access: Stairs to enter Entrance Stairs-Rails: None Entrance Stairs-Number of Steps: 1 Bathroom Shower/Tub: Art gallery manager: Yes Home Care Services: No  Discharge Living Setting Plans for Discharge Living Setting: Patient's home Type of Home at Discharge: House Discharge Home Layout: Two level, Able to live on main level with bedroom/bathroom Discharge Home Access: Stairs to enter Entrance Stairs-Number of Steps: 1 Discharge Bathroom Shower/Tub: Walk-in  shower Discharge Bathroom Toilet: Standard Discharge Bathroom Accessibility: Yes How Accessible: Accessible via walker Does the patient have any problems obtaining your medications?: No  Social/Family/Support Systems Patient Roles: Spouse, Parent Anticipated Caregiver: Husband, Sheron Tallman, retired Anticipated Ambulance person Information: [place] Ability/Limitations of Caregiver: no limitations; pt. States she does not believe her husband "gets" the gravity and life altering effects of her current situation.   Caregiver Availability: 24/7 Discharge Plan Discussed with Primary Caregiver: Yes Is Caregiver In Agreement with Plan?: Yes Does Caregiver/Family have Issues with Lodging/Transportation while Pt is in Rehab?: No   Goals/Additional Needs Patient/Family Goal for Rehab: mod I and supervision with PT and OT; n/a SLP Expected length of stay: 12-14 days Cultural Considerations: "[facility]" Dietary Needs: regular diet with thin liquids Equipment Needs: TBA Additional Information: Per pt's oncologist, Dr. Arther Abbott, pt. to have radiation oncology consult to discuss role of palliateive radiation  in a few weeks.   Pt/Family Agrees to Admission and willing to participate: Yes Program Orientation Provided & Reviewed with Pt/Caregiver Including Roles  & Responsibilities: Yes   Decrease burden of Care through IP rehab admission: no   Possible need for SNF placement upon discharge:  Not anticipated   Patient Condition: This patient's condition remains as documented in the consult dated 02/17/15, in which the Rehabilitation Physician determined and documented that the patient's condition is appropriate for intensive rehabilitative care in an inpatient rehabilitation facility. Will admit to inpatient rehab today.  Preadmission Screen Completed By:  Gerlean Ren, 02/18/2015 2:44 PM ______________________________________________________________________   Discussed status  with Dr.  Posey Pronto on 02/18/15  at  1450  and received telephone approval for admission today.  Admission Coordinator:  Gerlean Ren, time 2440 /Date 02/18/15

## 2015-02-18 NOTE — H&P (Addendum)
Physical Medicine and Rehabilitation Admission H&P    Chief Complaint  Patient presents with  . Paraparesis     Due to T5 compression with myelopathy due to metastatic breastcancer    HPI: Heather [name] is a 69 y.o. right handed female with history of HTN, CKD, recent gastritis,  R-breast cancer s/p mastectomy, with chemo/XRT 2013 with neuropathy and adjuvant therapy with letrozole.  She was admitted on 02/14/2015 with reports of numbness from chest down with pain since July and one week history of BLE weakness, balance problems progressing to inability to walk, difficulty voiding and constipation.  MRI brain without evidence of metastatic disease. MRI C-T-L-spine with large destructive T5 lesion with severe pathologic compression and extensive epidural tumor as well as indeterminate scattered foci of enhancement in thoracic and lumbar spine.  She was evaluated by Dr. Kathyrn Sheriff who recommended decompressive surgery. She was started on decadron and underwent T5 laminectomy with decompression and T3-T5 screw stabilization on 02/16/15.  [name] consulted for input and recommended palliative radiation to spine as well as PET CT for re-staging after discharge.  Pathology positive for metastatic cancer due to breast primary. Hospital course complicated by pain management, hyponatremia, transaminitis, ABLA . Subcutaneous Lovenox for DVT prophylaxis. Acute blood loss anemia 9.5 and monitored.  Post op with ABLA and has had improvement in BLE strength.  PT/OT ongoing and CIR recommended for follow up therapy. Pt lives in a 1 story home with her husband, who is able to provide superversion, but only partial physical support. He has had therapy before when she was not able to use her hand and had great results, so she hoping to gain the same benefit with IPR.   Review of Systems  Constitutional: Positive for weight loss (in past two months due to gastritis).  HENT: Negative for hearing loss and tinnitus.    Respiratory: Negative for cough and shortness of breath.   Cardiovascular: Positive for chest pain (Severe bandlike pain from back that encircles"squeezing" the chest at times.  Neuropathy left chest wall/masectomy site). Negative for leg swelling.  Gastrointestinal: Positive for heartburn and constipation (ongoing for about 2 weeks). Negative for nausea and vomiting.  Genitourinary: Positive for urgency.       New onset of incontinence few days PTA  Musculoskeletal: Positive for myalgias and back pain.  Neurological: Positive for sensory change (numb from chest down), focal weakness, weakness and headaches. Negative for dizziness and tingling.  Psychiatric/Behavioral: Negative for depression and memory loss. The patient is nervous/anxious.       Past Medical History  Diagnosis Date  . Chronic kidney disease   . Hx: UTI (urinary tract infection)   . Right shoulder pain   . Right knee pain   . Hypertension     recently started Aldactone   . Hyperlipidemia     but not on meds;diet and exercise controlled  . Dizziness     has been going on for 18month;medical MD aware  . H/O hiatal hernia   . GERD (gastroesophageal reflux disease)     Tums prn  . Hemorrhoids   . Urinary frequency     d/t taking Aldactone  . History of UTI   . Anemia     hx of  . Cataract     immature-not sure of which eye  . Anxiety     r/t updated surgery  . Insomnia     takes Melatoniin daily  . Complication of anesthesia  pt states she is sensitive to meds  . Breast cancer ([place])     right  . Skin cancer   . Hx of radiation therapy 10/25/12- 12/11/12    right chest wall/regional lymph nodes 5040 cGy, 28 sessions, right chest wall boost 1000 cGy 1 session  . H. pylori infection     Past Surgical History  Procedure Laterality Date  . Cyst removed from left breast  1970  . Left wrist surgery   2004    with plate  . Colonoscopy    . Esophagogastroduodenoscopy    . Mastectomy, radical    .  Mastectomy modified radical  05/10/2012    Procedure: MASTECTOMY MODIFIED RADICAL;  Surgeon: Merrie Roof, MD;  Location: [facility];  Service: General;  Laterality: Right;  RIGHT MODIFIED RADICAL MASTECTOMY  . Portacath placement  05/10/2012    Procedure: INSERTION PORT-A-CATH;  Surgeon: Merrie Roof, MD;  Location: [facility];  Service: General;  Laterality: Left;  . Decompressive lumbar laminectomy level 4 N/A 02/15/2015    Procedure: DECOMPRESSION T5 AND T3-T7 STABALIZATION;  Surgeon: Consuella Lose, MD;  Location: [facility];  Service: Neurosurgery;  Laterality: N/A;    Family History  Problem Relation Age of Onset  . Leukemia Mother   . Colon cancer Brother   . Cancer Brother     Prostate  . Colon cancer Maternal Grandmother   . Stroke Father   . Diabetes Mellitus II Father     Social History:  Married. 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 She reports that she has never smoked. She has never used smokeless tobacco. She reports that she does not drink alcohol or use illicit drugs.    Allergies  Allergen Reactions  . Codeine     " loopy"  . Keflex [Cephalexin]     GI Upset  . Naprosyn [Naproxen] Other (See Comments)    GI ipset   . Tussin [Guaifenesin] Other (See Comments)    Dizzy     Medications Prior to Admission  Medication Sig Dispense Refill  . B Complex-C (SUPER B COMPLEX PO) Take 1 each by mouth daily.    Marland Kitchen letrozole (FEMARA) 2.5 MG tablet Take 1 tablet (2.5 mg total) by mouth daily. 90 tablet 3  . methocarbamol (ROBAXIN) 500 MG tablet Take 500 mg by mouth daily as needed for muscle spasms.    . polyethylene glycol powder (GLYCOLAX/MIRALAX) powder Take 1 Container by mouth daily as needed for mild constipation.    . Probiotic Product (PROBIOTIC DAILY PO) Take 1 capsule by mouth daily.     . ranitidine (ZANTAC) 300 MG tablet Take 300 mg by mouth daily.    Marland Kitchen spironolactone (ALDACTONE) 25 MG tablet Take 0.5 tablets (12.5 mg total) by mouth daily. 45 tablet  12  . traMADol (ULTRAM) 50 MG tablet Take 50 mg by mouth every 6 (six) hours as needed for moderate pain.    Marland Kitchen UNABLE TO FIND Rx: L8000- Post Surgical Bra (Quantity: 6) Y6415- Silicone Breast Prosthesis (Quantity: 1) Dx: 174.9; Right mastectomy 1 each 0  . furosemide (LASIX) 20 MG tablet Take 1 tablet (20 mg total) by mouth daily as needed. (Patient not taking: Reported on 01/11/2015) 30 tablet 3  . pravastatin (PRAVACHOL) 10 MG tablet Take 10 mg by mouth daily.       Home: Home Living Family/patient expects to be discharged to:: Private residence Living Arrangements: Spouse/significant other Available Help at Discharge: Family, Available 24 hours/day Type of  Home: House Home Access: Stairs to enter [employer] of Steps: 1 Entrance Stairs-Rails: None Home Layout: Able to live on main level with bedroom/bathroom, Two level Bathroom Shower/Tub: Holiday representative Accessibility: Yes Home Equipment: Environmental consultant - 2 wheels, Bedside commode, Shower seat   Functional History: Prior Function Level of Independence: Independent with assistive device(s) Comments:  (used RW for several days PTA)  Functional Status:  Mobility: Bed Mobility Overal bed mobility: Needs Assistance Bed Mobility: Rolling, Sidelying to Sit Rolling: Supervision Sidelying to sit: Min guard General bed mobility comments: HOB flat, use of rails. Pt was able to demonstrate log roll technique with close guard for safety as pt elevated trunk to full sitting position. VC's throughout transfer for sequencing and technique.  Transfers Overall transfer level: Needs assistance Equipment used: Rolling walker (2 wheeled) Transfers: Sit to/from Stand Sit to Stand: Min assist, +2 physical assistance General transfer comment: Pt was able to power-up to full standing position with assist for balance/support. +2 available for safety.  Ambulation/Gait Ambulation/Gait assistance: Mod assist, Max assist, +2  safety/equipment Ambulation Distance (Feet): 50 Feet Assistive device: Rolling walker (2 wheeled) Gait Pattern/deviations: Step-to pattern, Decreased stride length, Steppage, Ataxic, Trunk flexed, Narrow base of support, Scissoring General Gait Details: Pt required mod assist for balance support, increasing to max assist at times as pt fatigued, and chair follow was utilized. Noted grossly ataxic steps with occasional scissoring and frequent tandem steps. Almost constant cueing required for sequencing and technique with each step. 1 seated rest break required.  Gait velocity: Decreased Gait velocity interpretation: Below normal speed for age/gender    ADL: ADL Overall ADL's : Needs assistance/impaired Upper Body Dressing : Set up, Supervision/safety, Sitting Lower Body Dressing: Sit to/from stand, With adaptive equipment, Moderate assistance Toilet Transfer: +2 for safety/equipment, Minimal assistance, Ambulation, RW (sit to stand from bed) General ADL Comments: Educated on AE and discussed LB dressing technique.   Cognition: Cognition Overall Cognitive Status: Within Functional Limits for tasks assessed Orientation Level: Oriented X4 Cognition Arousal/Alertness: Awake/alert Behavior During Therapy: WFL for tasks assessed/performed Overall Cognitive Status: Within Functional Limits for tasks assessed   Blood pressure 114/50, pulse 78, temperature 97.8 F (36.6 C), temperature source Oral, resp. rate 20, height '5\' 4"'  (1.626 m), weight 58.968 kg (130 lb), SpO2 99 %. Physical Exam  Nursing note and vitals reviewed. Constitutional: She is oriented to person, place, and time. She appears well-developed and well-nourished. No distress.  HENT:  Head: Normocephalic and atraumatic.  Mouth/Throat: Oropharynx is clear and moist.  Eyes: Conjunctivae and EOM are normal. Pupils are equal, round, and reactive to light.  Neck: Normal range of motion. Neck supple.  Cardiovascular: Normal rate and  regular rhythm.   No murmur heard. Respiratory: Effort normal and breath sounds normal.  GI: Soft. Bowel sounds are normal. She exhibits no distension. There is no tenderness.  Musculoskeletal: She exhibits no edema or tenderness.  B/l UE: 4/5 B/l LE 3+/5   Neurological: She is alert and oriented to person, place, and time. She displays abnormal reflex. Coordination (BLE ataxic) abnormal.  Sensation decreased below T-6.  No evidence of spasticity.  No clonus noted.   Skin: Skin is warm and dry. No rash noted. No erythema.  Upper thoracic incision with honeycomb dressing--no redness, drainage or tenderness.     Results for orders placed or performed during the hospital encounter of 02/14/15 (from the past 48 hour(s))  CBC WITH DIFFERENTIAL     Status: Abnormal   Collection Time: 02/17/15  4:41 AM  Result Value Ref Range   WBC 8.2 4.0 - 10.5 K/uL   RBC 3.31 (L) 3.87 - 5.11 MIL/uL   Hemoglobin 9.5 (L) 12.0 - 15.0 g/dL   HCT 28.8 (L) 36.0 - 46.0 %   MCV 87.0 78.0 - 100.0 fL   MCH 28.7 26.0 - 34.0 pg   MCHC 33.0 30.0 - 36.0 g/dL   RDW 12.9 11.5 - 15.5 %   Platelets 187 150 - 400 K/uL   Neutrophils Relative % 89 %   Neutro Abs 7.2 1.7 - 7.7 K/uL   Lymphocytes Relative 6 %   Lymphs Abs 0.5 (L) 0.7 - 4.0 K/uL   Monocytes Relative 5 %   Monocytes Absolute 0.4 0.1 - 1.0 K/uL   Eosinophils Relative 0 %   Eosinophils Absolute 0.0 0.0 - 0.7 K/uL   Basophils Relative 0 %   Basophils Absolute 0.0 0.0 - 0.1 K/uL  Comprehensive metabolic panel     Status: Abnormal   Collection Time: 02/17/15  4:41 AM  Result Value Ref Range   Sodium 133 (L) 135 - 145 mmol/L   Potassium 4.8 3.5 - 5.1 mmol/L    Comment: DELTA CHECK NOTED   Chloride 98 (L) 101 - 111 mmol/L   CO2 28 22 - 32 mmol/L   Glucose, Bld 158 (H) 65 - 99 mg/dL   BUN 14 6 - 20 mg/dL   Creatinine, Ser 0.91 0.44 - 1.00 mg/dL   Calcium 9.0 8.9 - 10.3 mg/dL   Total Protein 5.7 (L) 6.5 - 8.1 g/dL   Albumin 3.0 (L) 3.5 - 5.0 g/dL   AST  47 (H) 15 - 41 U/L   ALT 29 14 - 54 U/L   Alkaline Phosphatase 60 38 - 126 U/L   Total Bilirubin 0.5 0.3 - 1.2 mg/dL   GFR calc non Af Amer >60 >60 mL/min   GFR calc Af Amer >60 >60 mL/min    Comment: (NOTE) The eGFR has been calculated using the CKD EPI equation. This calculation has not been validated in all clinical situations. eGFR's persistently <60 mL/min signify possible Chronic Kidney Disease.    Anion gap 7 5 - 15  Vitamin B12     Status: None   Collection Time: 02/17/15 10:01 AM  Result Value Ref Range   Vitamin B-12 615 180 - 914 pg/mL    Comment: (NOTE) This assay is not validated for testing neonatal or myeloproliferative syndrome specimens for Vitamin B12 levels.   Folate     Status: None   Collection Time: 02/17/15 10:01 AM  Result Value Ref Range   Folate 20.4 >5.9 ng/mL  Iron and TIBC     Status: None   Collection Time: 02/17/15 10:01 AM  Result Value Ref Range   Iron 29 28 - 170 ug/dL   TIBC 267 250 - 450 ug/dL   Saturation Ratios 11 10.4 - 31.8 %   UIBC 238 ug/dL  Ferritin     Status: None   Collection Time: 02/17/15 10:01 AM  Result Value Ref Range   Ferritin 65 11 - 307 ng/mL  Reticulocytes     Status: Abnormal   Collection Time: 02/17/15 10:01 AM  Result Value Ref Range   Retic Ct Pct 1.4 0.4 - 3.1 %   RBC. 3.61 (L) 3.87 - 5.11 MIL/uL   Retic Count, Manual 50.5 19.0 - 186.0 K/uL  CBC     Status: Abnormal   Collection Time: 02/18/15  6:59 AM  Result  Value Ref Range   WBC 6.1 4.0 - 10.5 K/uL   RBC 3.36 (L) 3.87 - 5.11 MIL/uL   Hemoglobin 9.7 (L) 12.0 - 15.0 g/dL   HCT 29.5 (L) 36.0 - 46.0 %   MCV 87.8 78.0 - 100.0 fL   MCH 28.9 26.0 - 34.0 pg   MCHC 32.9 30.0 - 36.0 g/dL   RDW 13.1 11.5 - 15.5 %   Platelets 185 150 - 400 K/uL  Basic metabolic panel     Status: Abnormal   Collection Time: 02/18/15  6:59 AM  Result Value Ref Range   Sodium 137 135 - 145 mmol/L   Potassium 4.2 3.5 - 5.1 mmol/L   Chloride 96 (L) 101 - 111 mmol/L   CO2 31 22  - 32 mmol/L   Glucose, Bld 141 (H) 65 - 99 mg/dL   BUN 18 6 - 20 mg/dL   Creatinine, Ser 0.77 0.44 - 1.00 mg/dL   Calcium 9.4 8.9 - 10.3 mg/dL   GFR calc non Af Amer >60 >60 mL/min   GFR calc Af Amer >60 >60 mL/min    Comment: (NOTE) The eGFR has been calculated using the CKD EPI equation. This calculation has not been validated in all clinical situations. eGFR's persistently <60 mL/min signify possible Chronic Kidney Disease.    Anion gap 10 5 - 15   No results found.     Medical Problem List and Plan: 1. Functional deficits secondary to T5 cord compression/myelopathy from pathologic compression fracture 2.  DVT Prophylaxis/Anticoagulation: Pharmaceutical: Lovenox 3. Pain Management: Will add neurontin to help with neuropathic pain. Continue oxycodone prn.  4. Adjustment reaction with anxiety/ Mood: Ego support provided.  Will add low dose Xanax to help with anxiety attacks. Team to provide ego support. LCSW to follow for evaluation and support.  5. Neuropsych: This patient is capable of making decisions on her own behalf. 6. Skin/Wound Care: Routine pressure relief measures. Maintain adequate nutrition and hydration status.  7. Fluids/Electrolytes/Nutrition: Monitor I/O. Check lytes in am.  8. ABLA: Wants to hold off on iron due to concerns of constipation. Monitor for now--improve po intake. Check CBC in am.  9. HTN: Monitor BID. Continue aldactone daily and titrate as needed.  10. Mild chronic gastritis: Continue  Pepcid bid. Monitor for increase in symptoms with decadron on board.  11. Neurogenic bowel and bladder:  Has not had BM since admission. Start bowel program. Enema today. D/C foley in am and start bladder training. Will check UA/UCS.    Post Admission Physician Evaluation: 1. Functional deficits secondary  to T5 cord compression/myelopathy from pathologic compression fracture 2. Patient is admitted to receive collaborative, interdisciplinary care between the  physiatrist, rehab nursing staff, and therapy team. 3. Patient's level of medical complexity and substantial therapy needs in context of that medical necessity cannot be provided at a lesser intensity of care such as a SNF. 4. Patient has experienced substantial functional loss from his/her baseline which was documented above under the "Functional History" and "Functional Status" headings.  Judging by the patient's diagnosis, physical exam, and functional history, the patient has potential for functional progress which will result in measurable gains while on inpatient rehab.  These gains will be of substantial and practical use upon discharge  in facilitating mobility and self-care at the household level. 5. Physiatrist will provide 24 hour management of medical needs as well as oversight of the therapy plan/treatment and provide guidance as appropriate regarding the interaction of the two. 6. 24  hour rehab nursing will assist with bladder management, bowel management, safety, skin/wound care, disease management, pain management and patient education and help integrate therapy concepts, techniques,education, etc. 7. PT will assess and treat for/with: Lower extremity strength (with emphasis on proprioception), range of motion, stamina, balance, functional mobility, safety, adaptive techniques and equipment, woundcare, coping skills, pain control, education.   Goals are: Mod I/Supervision. 8. OT will assess and treat for/with: ADL's, functional mobility, safety, upper extremity strength, adaptive techniques and equipment, wound mgt, ego support, and community reintegration.   Goals are: Mod I/supervision. Therapy may not proceed with showering this patient. 9. Case Management and Social Worker will assess and treat for psychological issues and discharge planning. 10. Team conference will be held weekly to assess progress toward goals and to determine barriers to discharge. 11. Patient will receive at least  3 hours of therapy per day at least 5 days per week. 12. ELOS: 12-14 days.       13. Prognosis:  excellent and good  Delice Lesch, MD  02/18/2015

## 2015-02-18 NOTE — Progress Notes (Signed)
I have spoken with Dr. Sloan Leiter and await Dr. Cleotilde Neer clearance for admission to IP Rehab today.  Will follow up once Dr. Kathyrn Sheriff sees pt.  Please call if questions.  [facility] Admissions Coordinator Cell [phone] Office [phone]

## 2015-02-18 NOTE — Progress Notes (Signed)
Per Dr. Cleotilde Neer progress note from today, pt. cleared for IP Rehab today.  Dr. Sloan Leiter in agreement.  I have notified Jeannie Crutchfield, CM and Dysheka Bibbs, SW as well as pt's Therapist, sports.  Pt. Pleased to be admitting today.  Please call if questions.  [facility] Admissions Coordinator Cell [phone] Office [phone]

## 2015-02-18 NOTE — Progress Notes (Signed)
Patient transferred to 4M02 vias bed

## 2015-02-18 NOTE — Progress Notes (Signed)
No issues overnight. Pt has no new c/o.  EXAM:  BP 114/50 mmHg  Pulse 78  Temp(Src) 97.8 F (36.6 C) (Oral)  Resp 20  Ht '5\' 4"'$  (1.626 m)  Wt 58.968 kg (130 lb)  BMI 22.30 kg/m2  SpO2 99%  Awake, alert, oriented  Speech fluent, appropriate  CN grossly intact  Mild proximal leg weakness Wound c/d/i, no leak  IMPRESSION:  69 y.o. female POD# 3 s/p T5 decompression/stabilization with path c/w previous breast carcinoma. Neurologically stable to improved.  PLAN: - CIR today - Can f/u with me after d/c from rehab. Will discuss pts case at neuro-oncology conference next week.

## 2015-02-18 NOTE — Progress Notes (Signed)
[name], MD Physician Signed Physical Medicine and Rehabilitation Consult Note 02/17/2015 9:48 AM  Related encounter: ED to Hosp-Admission (Current) from 02/14/2015 in [place] All Collapse All        Physical Medicine and Rehabilitation Consult Reason for Consult: T5 cord compression/myelopathy from pathologic compression fracture Referring Physician: [facility]   HPI: Heather [name] is a 69 y.o. right handed female with history of breast cancer status post mastectomy, lymph node dissection with 28 sessions radiation and chemotherapy therapy 10/25/2012-12/11/2012 followed by [name]. Patient lives with spouse 2 level home with bedroom downstairs one step to entry. Independent with assistive device prior to admission that she been using for the past few days. Presented 02/14/2015 with bilateral lower extremity weakness and numbness around the chest level down 1 week with pain wrapping around the chest and back. MRI imaging revealed infiltration of the T5 vertebrae involving both body and posterior elements causing significant canal compromise and spinal cord compression/myelopathy. Kyphosis at this level due to pathologic fracture of T5 body. Underwent T5 laminectomy, bilateral transpedicular decompression of thecal sac, T3-T7 nonsegmental pedicle screw stabilization 02/15/2015 per Dr. Kathyrn Sheriff. Hospital course complicated by pain management, hyponatremia, transaminitis, ABLA . Subcutaneous Lovenox for DVT prophylaxis. Acute blood loss anemia 9.5 and monitored. Physical therapy evaluation completed 02/17/2015 with recommendations of physical medicine rehabilitation consult. Pt lives in a 1 story home with her husband, who is able to provide superversion, but only partial physical support. He has had therapy before when she was not able to use her hand and had great results, so she hoping to gain the same benefit with IPR.    Review of  Systems  Constitutional: Negative for fever and chills.  HENT: Negative for hearing loss.  Eyes: Negative for blurred vision and double vision.  Respiratory: Negative for cough and shortness of breath.  Cardiovascular: Negative for chest pain, palpitations and leg swelling.  Gastrointestinal: Positive for abdominal pain and constipation. Negative for nausea and vomiting.   GERD  Genitourinary: Positive for frequency. Negative for dysuria and hematuria.  Musculoskeletal: Positive for myalgias, back pain and joint pain.   Chest wall pain  Skin: Negative for rash.  Neurological: Positive for dizziness, sensory change (chest caudally; parasthesias) and weakness. Negative for headaches.  Psychiatric/Behavioral: The patient has insomnia.   Anxiety  All other systems reviewed and are negative.  Past Medical History  Diagnosis Date  . Chronic kidney disease   . Hx: UTI (urinary tract infection)   . Right shoulder pain   . Right knee pain   . Hypertension     recently started Aldactone   . Hyperlipidemia     but not on meds;diet and exercise controlled  . Dizziness     has been going on for 50month;medical MD aware  . H/O hiatal hernia   . GERD (gastroesophageal reflux disease)     Tums prn  . Hemorrhoids   . Urinary frequency     d/t taking Aldactone  . History of UTI   . Anemia     hx of  . Cataract     immature-not sure of which eye  . Anxiety     r/t updated surgery  . Insomnia     takes Melatoniin daily  . Complication of anesthesia     pt states she is sensitive to meds  . Breast cancer ([place]     right  . Skin cancer   .  Hx of radiation therapy 10/25/12- 12/11/12    right chest wall/regional lymph nodes 5040 cGy, 28 sessions, right chest wall boost 1000 cGy 1 session  . H. pylori infection    Past Surgical History  Procedure Laterality Date  .  Cyst removed from left breast  1970  . Left wrist surgery   2004    with plate  . Colonoscopy    . Esophagogastroduodenoscopy    . Mastectomy, radical    . Mastectomy modified radical  05/10/2012    Procedure: MASTECTOMY MODIFIED RADICAL; Surgeon: Merrie Roof, MD; Location: [facility]; Service: General; Laterality: Right; RIGHT MODIFIED RADICAL MASTECTOMY  . Portacath placement  05/10/2012    Procedure: INSERTION PORT-A-CATH; Surgeon: Merrie Roof, MD; Location: [facility]; Service: General; Laterality: Left;  . Decompressive lumbar laminectomy level 4 N/A 02/15/2015    Procedure: DECOMPRESSION T5 AND T3-T7 STABALIZATION; Surgeon: Consuella Lose, MD; Location: [facility]; Service: Neurosurgery; Laterality: N/A;   Family History  Problem Relation Age of Onset  . Leukemia Mother   . Colon cancer Brother   . Cancer Brother     Prostate  . Colon cancer Maternal Grandmother   . Stroke Father   . Diabetes Mellitus II Father    Social History:  reports that she has never smoked. She has never used smokeless tobacco. She reports that she does not drink alcohol or use illicit drugs. Allergies:  Allergies  Allergen Reactions  . Codeine     " loopy"  . Keflex [Cephalexin]     GI Upset  . Naprosyn [Naproxen] Other (See Comments)    GI ipset   . Tussin [Guaifenesin] Other (See Comments)    Dizzy    Medications Prior to Admission  Medication Sig Dispense Refill  . B Complex-C (SUPER B COMPLEX PO) Take 1 each by mouth daily.    Marland Kitchen letrozole (FEMARA) 2.5 MG tablet Take 1 tablet (2.5 mg total) by mouth daily. 90 tablet 3  . methocarbamol (ROBAXIN) 500 MG tablet Take 500 mg by mouth daily as needed for muscle spasms.    . polyethylene glycol powder (GLYCOLAX/MIRALAX) powder Take 1 Container by mouth daily as needed for mild constipation.    . Probiotic  Product (PROBIOTIC DAILY PO) Take 1 capsule by mouth daily.     . ranitidine (ZANTAC) 300 MG tablet Take 300 mg by mouth daily.    Marland Kitchen spironolactone (ALDACTONE) 25 MG tablet Take 0.5 tablets (12.5 mg total) by mouth daily. 45 tablet 12  . traMADol (ULTRAM) 50 MG tablet Take 50 mg by mouth every 6 (six) hours as needed for moderate pain.    Marland Kitchen UNABLE TO FIND Rx: L8000- Post Surgical Bra (Quantity: 6) H3716- Silicone Breast Prosthesis (Quantity: 1) Dx: 174.9; Right mastectomy 1 each 0  . furosemide (LASIX) 20 MG tablet Take 1 tablet (20 mg total) by mouth daily as needed. (Patient not taking: Reported on 01/11/2015) 30 tablet 3  . pravastatin (PRAVACHOL) 10 MG tablet Take 10 mg by mouth daily.       Home: Home Living Family/patient expects to be discharged to:: Private residence Living Arrangements: Spouse/significant other Available Help at Discharge: Family, Available 24 hours/day Type of Home: House Home Access: Stairs to enter [employer] of Steps: 1 Entrance Stairs-Rails: None Home Layout: Able to live on main level with bedroom/bathroom, Two level Bathroom Shower/Tub: Holiday representative Accessibility: Yes Home Equipment: Environmental consultant - 2 wheels, Bedside commode, Shower seat  Functional History: Prior Function Level  of Independence: Independent with assistive device(s) Comments: Independent until last 4 days prior to admission;Using the RW last 4 days prior to surgery  Functional Status:  Mobility: Bed Mobility Overal bed mobility: Needs Assistance Bed Mobility: Rolling, Sidelying to Sit Rolling: Supervision Sidelying to sit: Min guard General bed mobility comments: Pt was able to demonstrate log roll technique with close guard for safety as pt elevated trunk to full sitting position. VC's throughout transfer for sequencing and technique.  Transfers Overall transfer level: Needs assistance Equipment used: Rolling walker (2  wheeled) Transfers: Sit to/from Stand Sit to Stand: Min assist, +2 safety/equipment General transfer comment: Pt was able to power-up to full standing position with assist for balance/support. +2 available for safety.  Ambulation/Gait Ambulation/Gait assistance: Min assist, +2 safety/equipment Ambulation Distance (Feet): 20 Feet Assistive device: Rolling walker (2 wheeled) Gait Pattern/deviations: Step-to pattern, Decreased stride length, Scissoring, Ataxic, Trunk flexed, Narrow base of support General Gait Details: Pt required +2 assist mainly for safety, and chair follow was utilized. Prior to stepping away from EOB, pt was able to perform pre-gait activity of marching in place and weight shifting to gain confidence. Pt was able to ambulate with assist for balance and support as pt took steps/readjusted foot positioning before advancing other foot. Noted grossly ataxic steps with occasional scissoring and frequent tandem steps.  Gait velocity: Decreased Gait velocity interpretation: Below normal speed for age/gender    ADL: ADL Overall ADL's : Needs assistance/impaired Upper Body Dressing : Set up, Supervision/safety, Sitting Lower Body Dressing: Sit to/from stand, With adaptive equipment, Moderate assistance Toilet Transfer: +2 for safety/equipment, Minimal assistance, Ambulation, RW (sit to stand from bed) General ADL Comments: Educated on AE and discussed LB dressing technique.   Cognition: Cognition Overall Cognitive Status: Within Functional Limits for tasks assessed Orientation Level: Oriented X4 Cognition Arousal/Alertness: Awake/alert Behavior During Therapy: WFL for tasks assessed/performed Overall Cognitive Status: Within Functional Limits for tasks assessed  Blood pressure 126/48, pulse 90, temperature 98.1 F (36.7 C), temperature source Oral, resp. rate 18, height '5\' 4"'$  (1.626 m), weight 58.968 kg (130 lb), SpO2 100 %. Physical Exam  Vitals reviewed. Constitutional:  She is oriented to person, place, and time. She appears well-developed and well-nourished.  HENT:  Head: Normocephalic and atraumatic.  +Glasses  Eyes: Conjunctivae and EOM are normal.  Neck: Normal range of motion. Neck supple. No thyromegaly present.  Cardiovascular: Normal rate and regular rhythm.  Respiratory: Effort normal and breath sounds normal. No respiratory distress.  +[place]  GI: Soft. Bowel sounds are normal. She exhibits no distension.  Musculoskeletal: She exhibits no edema.  B/l UE: 4/5 B/l LE 3+/5  Neurological: She is alert and oriented to person, place, and time.  Diminished sensation below ~t6 No clonus Neg Homan's  Skin: Skin is warm and dry.  Back incision is dressed  Psychiatric: She has a normal mood and affect. Her behavior is normal.     Lab Results Last 24 Hours    Results for orders placed or performed during the hospital encounter of 02/14/15 (from the past 24 hour(s))  CBC WITH DIFFERENTIAL Status: Abnormal   Collection Time: 02/17/15 4:41 AM  Result Value Ref Range   WBC 8.2 4.0 - 10.5 K/uL   RBC 3.31 (L) 3.87 - 5.11 MIL/uL   Hemoglobin 9.5 (L) 12.0 - 15.0 g/dL   HCT 28.8 (L) 36.0 - 46.0 %   MCV 87.0 78.0 - 100.0 fL   MCH 28.7 26.0 - 34.0 pg   MCHC 33.0  30.0 - 36.0 g/dL   RDW 12.9 11.5 - 15.5 %   Platelets 187 150 - 400 K/uL   Neutrophils Relative % 89 %   Neutro Abs 7.2 1.7 - 7.7 K/uL   Lymphocytes Relative 6 %   Lymphs Abs 0.5 (L) 0.7 - 4.0 K/uL   Monocytes Relative 5 %   Monocytes Absolute 0.4 0.1 - 1.0 K/uL   Eosinophils Relative 0 %   Eosinophils Absolute 0.0 0.0 - 0.7 K/uL   Basophils Relative 0 %   Basophils Absolute 0.0 0.0 - 0.1 K/uL  Comprehensive metabolic panel Status: Abnormal   Collection Time: 02/17/15 4:41 AM  Result Value Ref Range   Sodium 133 (L) 135 - 145 mmol/L   Potassium 4.8 3.5 - 5.1 mmol/L   Chloride 98  (L) 101 - 111 mmol/L   CO2 28 22 - 32 mmol/L   Glucose, Bld 158 (H) 65 - 99 mg/dL   BUN 14 6 - 20 mg/dL   Creatinine, Ser 0.91 0.44 - 1.00 mg/dL   Calcium 9.0 8.9 - 10.3 mg/dL   Total Protein 5.7 (L) 6.5 - 8.1 g/dL   Albumin 3.0 (L) 3.5 - 5.0 g/dL   AST 47 (H) 15 - 41 U/L   ALT 29 14 - 54 U/L   Alkaline Phosphatase 60 38 - 126 U/L   Total Bilirubin 0.5 0.3 - 1.2 mg/dL   GFR calc non Af Amer >60 >60 mL/min   GFR calc Af Amer >60 >60 mL/min   Anion gap 7 5 - 15  Vitamin B12 Status: None   Collection Time: 02/17/15 10:01 AM  Result Value Ref Range   Vitamin B-12 615 180 - 914 pg/mL  Folate Status: None   Collection Time: 02/17/15 10:01 AM  Result Value Ref Range   Folate 20.4 >5.9 ng/mL  Iron and TIBC Status: None   Collection Time: 02/17/15 10:01 AM  Result Value Ref Range   Iron 29 28 - 170 ug/dL   TIBC 267 250 - 450 ug/dL   Saturation Ratios 11 10.4 - 31.8 %   UIBC 238 ug/dL  Ferritin Status: None   Collection Time: 02/17/15 10:01 AM  Result Value Ref Range   Ferritin 65 11 - 307 ng/mL  Reticulocytes Status: Abnormal   Collection Time: 02/17/15 10:01 AM  Result Value Ref Range   Retic Ct Pct 1.4 0.4 - 3.1 %   RBC. 3.61 (L) 3.87 - 5.11 MIL/uL   Retic Count, Manual 50.5 19.0 - 186.0 K/uL      Imaging Results (Last 48 hours)    Dg Thoracic Spine 2 View  02/16/2015 CLINICAL DATA: Surgical fixation of thoracic spine EXAM: OPERATIVE THORACIC SPINE 2 VIEW(S) COMPARISON: CT thoracic spine 02/15/2015 FINDINGS: Intraoperative fluoroscopy is utilized for surgical control purposes. Fluoroscopy time is recorded at 2 minutes 10 seconds. Exposure dose is not indicated. Postoperative changes with posterior rod and screw fixation of the upper thoracic spine bridging the destructive lesion demonstrated at T5 on previous CT scan.  Thoracic spine alignment appears grossly intact. Enteric and endotracheal tubes are noted. IMPRESSION: Intraoperative fluoroscopy utilized for surgical control purposes demonstrating posterior fixation of the upper thoracic spine. Electronically Signed By: Lucienne Capers M.D. On: 02/16/2015 01:22   Ct Thoracic Spine Wo Contrast  02/15/2015 CLINICAL DATA: Metastatic breast cancer with destructive tumor at T4. Preoperative evaluation. EXAM: CT THORACIC SPINE WITHOUT CONTRAST TECHNIQUE: Multidetector CT imaging of the thoracic spine was performed without intravenous contrast administration. Multiplanar CT image reconstructions were  also generated. COMPARISON: MRI 02/14/2015 FINDINGS: No significant finding from C7 through T3. At T4, there is tumor involvement with destruction of the lamina and inferior facet on the right. Lamina on the left is intact. At T5, there is a pathologic compression fracture with loss of height of 50-60%. The posterior portion of the vertebral body shows lytic destruction and tumor encroaches upon the spinal canal as better shown by MRI. There is lytic destruction of the posterior elements also at this level bilaterally. The ribs are intact. The spinous processes largely intact. At T6, there is a small lesion in the anterior aspect of the vertebral body very difficult to see by CT. There is possible or early involvement of the right superior articular facet better shown at MRI. Below that, no abnormality in the thoracic region is visible by CT. IMPRESSION: Lytic destructive lesion of the right lamina and inferior articular facet at T4. Lytic destruction of the posterior aspect of the T5 vertebral body with pathologic compression fracture resulting in loss of height of 50-60%. Tumor compromise of the canal better shown at MRI. Lytic destruction of the posterior elements at the T5 level is well. The ribs are not involved. Minimal involvement of the anterior superior  aspect of the T6 vertebral body and possibly of the superior articular facet on the right at T6. Abnormalities better shown at MR at that level. Electronically Signed By: Nelson Chimes M.D. On: 02/15/2015 14:24   Dg C-arm Gt 120 Min  02/16/2015 CLINICAL DATA: Surgical fixation of thoracic spine EXAM: OPERATIVE THORACIC SPINE 2 VIEW(S) COMPARISON: CT thoracic spine 02/15/2015 FINDINGS: Intraoperative fluoroscopy is utilized for surgical control purposes. Fluoroscopy time is recorded at 2 minutes 10 seconds. Exposure dose is not indicated. Postoperative changes with posterior rod and screw fixation of the upper thoracic spine bridging the destructive lesion demonstrated at T5 on previous CT scan. Thoracic spine alignment appears grossly intact. Enteric and endotracheal tubes are noted. IMPRESSION: Intraoperative fluoroscopy utilized for surgical control purposes demonstrating posterior fixation of the upper thoracic spine. Electronically Signed By: Lucienne Capers M.D. On: 02/16/2015 01:22     Assessment/Plan: Diagnosis: T5 cord compression/myelopathy from pathologic compression fracture Labs and images independently reviewed. Old records reviewed and summated above.  Respiratory: encourage early use of incentive spirometry as tolerated,assisted cough and deep breathing techniques.   Skin: daily skin checks, turn q2 (care with the spine)  Cardiovascular: may have orthostasis when OOB. May use abdominal binder, TEDs or ace wraps to BLE for this. If ineffective, consider salt tabs, midodrine or fludrocortisone.   Neuro: monitor for autonomic dysreflexia.  Extremities:. Continue ROM.  Psych: psychology consult for adjustment to disability for pt and family  Mobility: PT and OT evaluation for mobility, ADLS, strengthening  Spasticity: may develop spasticity. Manage spasticity only if indicated (pain, hygiene, prevention of contractures, functional  impairment).  Electrolyte: at risk for immobilization hypercalcemia, monitor labs.  Thermoregulation: may have poikilothermia due to high level SCI. Please adjust room temperature as needed according to body temp.  Pain Management: control with oral medications if possible  Bladder: Implement bladder program . Encourage self I&O cath training vs indwelling foley if possible to improve mobility, reduce infection, and increase safety  Bowel: Continue stress ulcer ppx. Implement mechanical and chemical bowel program and care training with scheduled suppository 30 min to 1 hour after meals to utilize gastrocolic and colorectal reflexes.   1. Does the need for close, 24 hr/day medical supervision in concert with the patient's rehab needs make  it unreasonable for this patient to be served in a less intensive setting? Yes Co-Morbidities requiring supervision/potential complications: HTN, breast CA, pain management, hyponatremia, transaminitis, ABLA 2. Due to bladder management, bowel management, safety, skin/wound care, disease management, pain management and patient education, does the patient require 24 hr/day rehab nursing? Yes 3. Does the patient require coordinated care of a physician, rehab nurse, PT (1.5-2 hrs/day, 5 days/week) and OT (1.5-2 hrs/day, 5 days/week) to address physical and functional deficits in the context of the above medical diagnosis(es)? Yes Addressing deficits in the following areas: balance, endurance, locomotion, strength, transferring, bowel/bladder control, dressing, toileting and psychosocial support 4. Can the patient actively participate in an intensive therapy program of at least 3 hrs of therapy per day at least 5 days per week? Potentially 5. The potential for patient to make measurable gains while on inpatient rehab is excellent and good 6. Anticipated functional outcomes upon discharge from inpatient rehab are modified independent and supervision  with PT, modified independent and supervision with OT, n/a with SLP. 7. Estimated rehab length of stay to reach the above functional goals is: 12-14 days. 8. Does the patient have adequate social supports and living environment to accommodate these discharge functional goals? Potentially 9. Anticipated D/C setting: Home 10. Anticipated post D/C treatments: HH therapy and Home excercise program 11. Overall Rehab/Functional Prognosis: excellent and good  RECOMMENDATIONS: This patient's condition is appropriate for continued rehabilitative care in the following setting: CIR Patient has agreed to participate in recommended program. Yes Note that insurance prior authorization may be required for reimbursement for recommended care.  Comment: Rehab Admissions Coordinator to follow up.  Delice Lesch, MD 02/17/2015       Revision History     Date/Time User Provider Type Action   02/17/2015 1:46 PM [name], MD Physician Sign   02/17/2015 10:04 AM Cathlyn Parsons, PA-C Physician Assistant Pend   View Details Report       Routing History     Date/Time From To Method   02/17/2015 1:46 PM [name], MD [name], MD In [facility]   02/17/2015 1:46 PM [name], MD [name], MD Fax

## 2015-02-19 ENCOUNTER — Inpatient Hospital Stay (HOSPITAL_COMMUNITY): Payer: Medicare Other | Admitting: Occupational Therapy

## 2015-02-19 ENCOUNTER — Inpatient Hospital Stay (HOSPITAL_COMMUNITY): Payer: Medicare Other

## 2015-02-19 ENCOUNTER — Inpatient Hospital Stay (HOSPITAL_COMMUNITY): Payer: Medicare Other | Admitting: Physical Therapy

## 2015-02-19 ENCOUNTER — Encounter (HOSPITAL_COMMUNITY): Payer: Self-pay | Admitting: *Deleted

## 2015-02-19 DIAGNOSIS — R52 Pain, unspecified: Secondary | ICD-10-CM

## 2015-02-19 DIAGNOSIS — C50919 Malignant neoplasm of unspecified site of unspecified female breast: Secondary | ICD-10-CM

## 2015-02-19 DIAGNOSIS — C7951 Secondary malignant neoplasm of bone: Secondary | ICD-10-CM

## 2015-02-19 DIAGNOSIS — N319 Neuromuscular dysfunction of bladder, unspecified: Secondary | ICD-10-CM

## 2015-02-19 DIAGNOSIS — D62 Acute posthemorrhagic anemia: Secondary | ICD-10-CM

## 2015-02-19 DIAGNOSIS — K592 Neurogenic bowel, not elsewhere classified: Secondary | ICD-10-CM

## 2015-02-19 DIAGNOSIS — M4714 Other spondylosis with myelopathy, thoracic region: Secondary | ICD-10-CM

## 2015-02-19 DIAGNOSIS — G8220 Paraplegia, unspecified: Principal | ICD-10-CM

## 2015-02-19 LAB — CBC WITH DIFFERENTIAL/PLATELET
Basophils Absolute: 0 10*3/uL (ref 0.0–0.1)
Basophils Relative: 0 %
Eosinophils Absolute: 0 10*3/uL (ref 0.0–0.7)
Eosinophils Relative: 0 %
HCT: 30.3 % — ABNORMAL LOW (ref 36.0–46.0)
Hemoglobin: 10 g/dL — ABNORMAL LOW (ref 12.0–15.0)
Lymphocytes Relative: 10 %
Lymphs Abs: 0.5 10*3/uL — ABNORMAL LOW (ref 0.7–4.0)
MCH: 28.9 pg (ref 26.0–34.0)
MCHC: 33 g/dL (ref 30.0–36.0)
MCV: 87.6 fL (ref 78.0–100.0)
Monocytes Absolute: 0.3 10*3/uL (ref 0.1–1.0)
Monocytes Relative: 6 %
Neutro Abs: 4 10*3/uL (ref 1.7–7.7)
Neutrophils Relative %: 84 %
Platelets: 221 10*3/uL (ref 150–400)
RBC: 3.46 MIL/uL — ABNORMAL LOW (ref 3.87–5.11)
RDW: 13 % (ref 11.5–15.5)
WBC: 4.8 10*3/uL (ref 4.0–10.5)

## 2015-02-19 LAB — COMPREHENSIVE METABOLIC PANEL
ALT: 58 U/L — ABNORMAL HIGH (ref 14–54)
AST: 44 U/L — ABNORMAL HIGH (ref 15–41)
Albumin: 3 g/dL — ABNORMAL LOW (ref 3.5–5.0)
Alkaline Phosphatase: 62 U/L (ref 38–126)
Anion gap: 8 (ref 5–15)
BUN: 20 mg/dL (ref 6–20)
CO2: 30 mmol/L (ref 22–32)
Calcium: 9.3 mg/dL (ref 8.9–10.3)
Chloride: 96 mmol/L — ABNORMAL LOW (ref 101–111)
Creatinine, Ser: 0.74 mg/dL (ref 0.44–1.00)
GFR calc Af Amer: >60 mL/min (ref >60)
GFR calc non Af Amer: >60 mL/min (ref >60)
Glucose, Bld: 139 mg/dL — ABNORMAL HIGH (ref 65–99)
Potassium: 3.9 mmol/L (ref 3.5–5.1)
Sodium: 134 mmol/L — ABNORMAL LOW (ref 135–145)
Total Bilirubin: 0.6 mg/dL (ref 0.3–1.2)
Total Protein: 5.9 g/dL — ABNORMAL LOW (ref 6.5–8.1)

## 2015-02-19 MED ORDER — GABAPENTIN 100 MG PO CAPS
100.0000 mg | ORAL_CAPSULE | Freq: Three times a day (TID) | ORAL | Status: DC
  Administered 2015-02-19 (×2): 100 mg via ORAL
  Filled 2015-02-19 (×3): qty 1

## 2015-02-19 NOTE — Progress Notes (Signed)
[place] PHYSICAL MEDICINE & REHABILITATION     PROGRESS NOTE    Subjective/Complaints: Had a fair night. Having pain from left chest wall into breast, upper left abdomen, describes it as "burning" in quality, ?indigestion. Slept fair. Has small bm last night. Foley just removed  ROS: mild anxiety. Pt denies fever, rash/itching, headache, blurred or double vision, nausea, vomiting,  diarrhea, chest pain, shortness of breath, palpitations, dysuria, dizziness,  , bleeding,  , or depression   Objective: Vital Signs: Blood pressure 127/62, pulse 105, temperature 98 F (36.7 C), temperature source Oral, resp. rate 18, height '5\' 4"'$  (1.626 m), weight 59.194 kg (130 lb 8 oz), SpO2 96 %. No results found.  Recent Labs  02/18/15 0659 02/19/15 0549  WBC 6.1 4.8  HGB 9.7* 10.0*  HCT 29.5* 30.3*  PLT 185 221    Recent Labs  02/18/15 0659 02/19/15 0549  NA 137 134*  K 4.2 3.9  CL 96* 96*  GLUCOSE 141* 139*  BUN 18 20  CREATININE 0.77 0.74  CALCIUM 9.4 9.3   CBG (last 3)  No results for input(s): GLUCAP in the last 72 hours.  Wt Readings from Last 3 Encounters:  02/18/15 59.194 kg (130 lb 8 oz)  02/14/15 58.968 kg (130 lb)  02/14/15 58.968 kg (130 lb)    Physical Exam:  Constitutional: She is oriented to person, place, and time. She appears well-developed and well-nourished. No distress.  HENT: dentition intact Head: Normocephalic and atraumatic.  Mouth/Throat: Oropharynx is clear and moist.  Eyes: Conjunctivae and EOM are normal. Pupils are equal, round, and reactive to light.  Neck: Normal range of motion. Neck supple.  Cardiovascular: Normal rate and regular rhythm.  No murmur heard. Respiratory: Effort normal and breath sounds normal.  GI: Soft. Bowel sounds are normal. She exhibits no distension. There is no tenderness.  Musculoskeletal: She exhibits no edema or tenderness.     Neurological: She is alert and oriented to person, place, and time. She displays  3+ dtr's at knees/ankles. Coordination (BLE ataxic) abnormal. UE grossly 4 to 4+/5 with inhibition proximally due to pain. LE: 3/5 hf,ke and 3 to 3+ adf/apf.  Sensation decreased below T5-6, does sense gross touch but has difficulty distinguishing hot/cold/pain. Impaired proprioception Skin: Skin is warm and dry. No rash noted. No erythema.  Upper thoracic incision with honeycomb dressing--no redness, drainage or tenderness.  Psych: mild anxiety  Assessment/Plan: 1. Functional deficits secondary to T5 Cord compression/myelopathy due compression fx, breast cancer which require 3+ hours per day of interdisciplinary therapy in a comprehensive inpatient rehab setting. Physiatrist is providing close team supervision and 24 hour management of active medical problems listed below. Physiatrist and rehab team continue to assess barriers to discharge/monitor patient progress toward functional and medical goals.  Function:  Bathing Bathing position      Bathing parts      Bathing assist        Upper Body Dressing/Undressing Upper body dressing                    Upper body assist        Lower Body Dressing/Undressing Lower body dressing                                  Lower body assist        Toileting Toileting     Toileting steps completed by helper: Adjust clothing prior  to toileting    Toileting assist     Transfers Chair/bed transfer   Chair/bed transfer method: Squat pivot Chair/bed transfer assist level: Moderate assist (Pt 50 - 74%/lift or lower) Chair/bed transfer assistive device: Armrests     Locomotion Ambulation     Max distance: 10 Assist level: 2 helpers   Wheelchair   Type: Manual Max wheelchair distance: 100 Assist Level: Supervision or verbal cues  Cognition Comprehension    Expression    Social Interaction    Problem Solving    Memory     Medical Problem List and Plan: 1. Functional deficits secondary to T5 cord  compression/myelopathy from pathologic compression fracture  -beginning CIR therapies today 2. DVT Prophylaxis/Anticoagulation: Pharmaceutical: Lovenox indicated due to immobility/hypercoaguable state  -platelets 221k 3. Pain Management: . Continue oxycodone prn.   -increase gabapentin to '100mg'$  tid, suspect T5 radiculopathy causing band like pain at left breast/chest wall 4. Adjustment reaction with anxiety/ Mood:   - continue low dose Xanax to help with anxiety attacks.   -Team to provide ego support. Provided patient positive information regarding potential today  -LCSW to follow for evaluation and support.  5. Neuropsych: This patient is capable of making decisions on her own behalf. 6. Skin/Wound Care: Routine pressure relief measures. Maintain adequate nutrition and hydration status.  -I personally reviewed the patient's labs today and bmet essentially normal.  7. Fluids/Electrolytes/Nutrition: Monitor I/O. Check lytes in am.  8. ABLA: I personally reviewed the patient's labs today. hgb trending up  -pt asked not to be on Fe++ supp  -continue to follow  9. HTN: Monitor BID. Continue aldactone daily and titrate as needed.  10. Mild chronic gastritis: Continue Pepcid bid.   -bears watching while on decadron, some of chest wall, hypogastric sx may be related to this.  11. Neurogenic bowel and bladder:small bm yesterday. miralax this am---SSE if no bm by end of therapies.  -voiding trial starting today  - check UA/UCS.    LOS (Days) 1 A FACE TO FACE EVALUATION WAS PERFORMED  SWARTZ,ZACHARY T 02/19/2015 9:33 AM

## 2015-02-19 NOTE — Evaluation (Signed)
Occupational Therapy Assessment and Plan  Patient Details  Name: Heather [name] MRN: [id number] Date of Birth: 1945-07-05  OT Diagnosis: abnormal posture, acute pain, ataxia, muscle weakness (generalized) and paraparesis at level T-5 Rehab Potential: Rehab Potential (ACUTE ONLY): Good ELOS: 15-21 days   Today's Date: 02/19/2015 OT Individual Time: 6629-4765 and 1430-1500 OT Individual Time Calculation (min): 60 min  And 30 min  Problem List:  Patient Active Problem List   Diagnosis Date Noted  . Thoracic myelopathy 02/18/2015  . Postoperative anemia due to acute blood loss 02/17/2015  . Spinal cord compression due to malignant neoplasm metastatic to spine ([place]) 02/15/2015  . Epidural mass 02/15/2015  . Thoracic spine tumor   . Pathologic fracture of vertebra 02/14/2015  . Pathologic fracture of thoracic vertebrae 02/14/2015  . Breast cancer metastasized to bone ([place]) 02/14/2015  . Hyperlipidemia 02/14/2015  . H. pylori infection   . HTN (hypertension) 04/17/2012  . Primary cancer of upper outer quadrant of right female breast ([place]) 03/08/2012    Past Medical History:  Past Medical History  Diagnosis Date  . Chronic kidney disease   . Hx: UTI (urinary tract infection)   . Right shoulder pain   . Right knee pain   . Hypertension     recently started Aldactone   . Hyperlipidemia     but not on meds;diet and exercise controlled  . Dizziness     has been going on for 41month;medical MD aware  . H/O hiatal hernia   . GERD (gastroesophageal reflux disease)     Tums prn  . Hemorrhoids   . Urinary frequency     d/t taking Aldactone  . History of UTI   . Anemia     hx of  . Cataract     immature-not sure of which eye  . Anxiety     r/t updated surgery  . Insomnia     takes Melatoniin daily  . Complication of anesthesia     pt states she is sensitive to meds  . [place] cancer [place]     right  . Skin cancer   . Hx of radiation therapy 10/25/12- 12/11/12    right chest  wall/regional lymph nodes 5040 cGy, 28 sessions, right chest wall boost 1000 cGy 1 session  . H. pylori infection    Past Surgical History:  Past Surgical History  Procedure Laterality Date  . Cyst removed from left breast  1970  . Left wrist surgery   2004    with plate  . Colonoscopy    . Esophagogastroduodenoscopy    . Mastectomy, radical    . Mastectomy modified radical  05/10/2012    Procedure: MASTECTOMY MODIFIED RADICAL;  Surgeon: PMerrie Roof MD;  [facility]  Service: General;  Laterality: Right;  RIGHT MODIFIED RADICAL MASTECTOMY  . Portacath placement  05/10/2012    Procedure: INSERTION PORT-A-CATH;  Surgeon: PMerrie Roof MD;  [facility]  Service: General;  Laterality: Left;  . Decompressive lumbar laminectomy level 4 N/A 02/15/2015    Procedure: DECOMPRESSION T5 AND T3-T7 STABALIZATION;  Surgeon: NConsuella Lose MD;  [facility];  Service: Neurosurgery;  Laterality: N/A;    Assessment & Plan Clinical Impression: Heather SALIBAis a 69y.o. right handed female with history of breast cancer status post mastectomy, lymph node dissection with 28 sessions radiation and chemotherapy therapy 10/25/2012-12/11/2012 followed by Dr. GLindi Adie Patient lives with spouse 1 level home with bedroom downstairs one  step to entry. Independent with assistive device prior to admission that she been using for the past few days. Presented 02/14/2015 with bilateral lower extremity weakness and numbness around the chest level down 1 week with pain wrapping around the chest and back. MRI imaging revealed infiltration of the T5 vertebrae involving both body and posterior elements causing significant canal compromise and spinal cord compression/myelopathy. Kyphosis at this level due to pathologic fracture of T5 body. Underwent T5 laminectomy, bilateral transpedicular decompression of thecal sac, T3-T7 nonsegmental pedicle screw stabilization 02/15/2015 per Dr. Kathyrn Sheriff. Hospital  course complicated by pain management, hyponatremia, tansaminitis, ABLA . Subcutaneous Lovenox for DVT prophylaxis. Acute blood loss anemia 9.5 and monitored. Physical and occupational therapy evaluations completed 02/17/2015 with recommendations of physical medicine rehabilitation consult. Patient was admitted for a comprehensive rehabilitation program   Patient currently requires max with basic self-care skills and IADL secondary to muscle weakness and muscle paralysis, decreased cardiorespiratoy endurance, and decreased sitting balance, decreased standing balance, decreased postural control, decreased balance strategies and difficulty maintaining precautions.  Prior to hospitalization, patient could complete ADLs/IADLs with modified independent .  Patient will benefit from skilled intervention to decrease level of assist with basic self-care skills, increase independence with basic self-care skills and increase level of independence with iADL prior to discharge home with care partner.  Anticipate patient will require intermittent supervision and minimal physical assistance and follow up home health.  OT - End of Session Activity Tolerance: Tolerates 30+ min activity with multiple rests Endurance Deficit: Yes Endurance Deficit Description: requires frequent rest breaks OT Assessment Rehab Potential (ACUTE ONLY): Good OT Patient demonstrates impairments in the following area(s): Balance;Pain;Endurance;Motor;Behavior;Safety;Sensory;Perception OT Basic ADL's Functional Problem(s): Grooming;Bathing;Dressing;Toileting OT Advanced ADL's Functional Problem(s): Simple Meal Preparation;Laundry OT Transfers Functional Problem(s): Toilet;Tub/Shower OT Additional Impairment(s): Fuctional Use of Upper Extremity OT Plan OT Intensity: Minimum of 1-2 x/day, 45 to 90 minutes OT Frequency: 5 out of 7 days OT Duration/Estimated Length of Stay: 15-21 days OT Treatment/Interventions: Balance/vestibular  training;Discharge planning;Community reintegration;DME/adaptive equipment instruction;Functional mobility training;Neuromuscular re-education;Pain management;Psychosocial support;Patient/family education;Self Care/advanced ADL retraining;Therapeutic Activities;Therapeutic Exercise;UE/LE Strength taining/ROM;UE/LE Coordination activities;Wheelchair propulsion/positioning OT Self Feeding Anticipated Outcome(s): Independent OT Basic Self-Care Anticipated Outcome(s): Supervision OT Toileting Anticipated Outcome(s): Supervision OT Bathroom Transfers Anticipated Outcome(s): Mod I toilet transfers; Supervision shower transfers OT Recommendation Patient destination: Home Follow Up Recommendations: Home health OT Equipment Recommended: To be determined   Skilled Therapeutic Intervention Session One: Pt seen for OT eval and ADL bathing/ dressing session. Pt in supine upon arrival, voicing fatigue however, agreeable to tx session. Stand pivot transfer completed from EOB> w/c with mod- max A and max VCs for footwork and positioning. She completed bathing/ dressing seated in w/c at the sink, completing standing tasks with min-mod steadying assist and VCs for LE placement. Grooming completed at sink with set-up.  Pt left sitting at sink at end of session finishing grooming task, NT made aware of pt's position.  Pt able to verbalize 3/3 spinal pre-cautions and education provided regarding functional implications of pre-cautions. Extensive education provided regarding SCI, decreased sensation, OT goals, CIR, POC, and d/c planning.   Session Two: Pt seen for OT therapy session focusing on ADL re-training and functional mobility/ transfers. Pt in supine upon arrival, voicing fatigue and pain in chest, however, agreeable to attempt therapy session. She donned B shoes/socks seated EOB with max A to maintain unsupported dynamic sitting balance, and to position ankle over knee. She completed squat pivot transfer to [facility]  with mod A with max cuing  for technique following demonstration. Pt educated regarding lateral leans for toileting task for clothing management and hygiene. Pt completed leans to remove clothing with assist. She stood at [facility] with steadying assist for total A to be provided to pull pants up. Squat pivot transfer completed to w/c. Pt left in w/c at end of session with hand off to PT.  Pt with decreased functional activity tolerance and is anxious with all mobility/ tasks. Pt benefits from explanation of task prior to participating and is very receptive to education. Rest breaks required throughout session.   OT Evaluation Precautions/Restrictions  Precautions Precautions: Fall;Back Restrictions Weight Bearing Restrictions: No General Chart Reviewed: Yes Additional Pertinent History: Hx of breat cancer and R mastectomy Pain Pain Assessment Pain Score: 7  Pain Location: Chest Pain Orientation: Mid Pain Descriptors / Indicators: Aching Pain Intervention(s): Repositioned;Ambulation/increased activity Home Living/Prior Functioning Home Living Available Help at Discharge: Family, Available 24 hours/day Type of Home: House Home Access: Stairs to enter [employer] of Steps: 1 Entrance Stairs-Rails: None Home Layout: Able to live on main level with bedroom/bathroom, Two level Bathroom Shower/Tub: Holiday representative Accessibility: Yes Additional Comments: Used RW for 1 week PTA  Lives With: Spouse Prior Function Level of Independence: Independent with homemaking with ambulation, Independent with basic ADLs, Independent with gait, Independent with transfers  Able to Take Stairs?: Yes Driving: Yes Comments: Went to the [employer] regularly to work out Vision/Perception  Vision- History Baseline Vision/History: Wears glasses Wears Glasses: At all times Patient Visual Report: No change from baseline  Cognition Overall Cognitive Status: Within Functional Limits for tasks  assessed Arousal/Alertness: Awake/alert Orientation Level: Person;Place;Situation Person: Oriented Place: Oriented Situation: Oriented Year: 2016 Month: October Day of Week: Correct Memory: Appears intact Immediate Memory Recall: Sock;Blue;Bed Memory Recall: Sock;Blue;Bed Memory Recall Sock: Without Cue Memory Recall Blue: Without Cue Memory Recall Bed: Without Cue Awareness: Appears intact Problem Solving: Appears intact Safety/Judgment: Appears intact Comments: Able to recall pre-cautions from acute hospital  Sensation Sensation Light Touch: Impaired Detail Light Touch Impaired Details: Impaired RLE;Impaired LLE;Impaired LUE;Impaired RUE Proprioception: Impaired Detail Proprioception Impaired Details: Impaired RLE;Impaired LLE Additional Comments: Hx of R UE numbness due to mastectomy Coordination Gross Motor Movements are Fluid and Coordinated: No Fine Motor Movements are Fluid and Coordinated: Yes Motor  Motor Motor: Ataxia;Abnormal postural alignment and control;Other (comment) (Incomplete paraperesis)  Trunk/Postural Assessment  Cervical Assessment Cervical Assessment: Within Functional Limits Thoracic Assessment Thoracic Assessment: Exceptions to [facility] (Kyphosis) Lumbar Assessment Lumbar Assessment: Exceptions to [facility] (Posterior pelvic tilt) Postural Control Postural Control: Deficits on evaluation Trunk Control: impaired due to SCI  Balance Balance Balance Assessed: Yes Static Sitting Balance Static Sitting - Level of Assistance: 5: Stand by assistance Dynamic Sitting Balance Dynamic Sitting - Level of Assistance: 4: Min assist Static Standing Balance Static Standing - Level of Assistance: 4: Min assist;3: Mod assist Dynamic Standing Balance Dynamic Standing - Level of Assistance: 3: Mod assist;2: Max assist Extremity/Trunk Assessment RUE Assessment RUE Assessment: Exceptions to [facility] RUE AROM (degrees) Overall AROM Right Upper Extremity: Within functional  limits for tasks performed RUE Strength RUE Overall Strength: Deficits (4/5 overall) LUE Assessment LUE Assessment: Exceptions to WFL (4+/5 overall)   See Function Navigator for Current Functional Status.   Refer to Care Plan for Long Term Goals  Recommendations for other services: None  Discharge Criteria: Patient will be discharged from OT if patient refuses treatment 3 consecutive times without medical reason, if treatment goals not met, if there is a change in medical status,  if patient makes no progress towards goals or if patient is discharged from hospital.  The above assessment, treatment plan, treatment alternatives and goals were discussed and mutually agreed upon: by patient  Bobby Rumpf, Dalexa Gentz C 02/19/2015, 2:30 PM

## 2015-02-19 NOTE — Progress Notes (Signed)
Physical Therapy Session Note  Patient Details  Name: Heather [name] MRN: [id number] Date of Birth: 12-Oct-1945  Today's Date: 02/19/2015 PT Individual Time: 1500-1530 PT Individual Time Calculation (min): 30 min   Short Term Goals: Week 1:  PT Short Term Goal 1 (Week 1): Pt will be able to perform basic transfers at min assist level PT Short Term Goal 2 (Week 1): Pt will be able to gait x 30' with mod assist of 1 PT Short Term Goal 3 (Week 1): Pt will be able to demonstrate dynamic standing balance during functional task with 1 UE support with mod assist  Skilled Therapeutic Interventions/Progress Updates:   Hand off from OT and pt reporting fatigue from full day of therapies. Session focused on w/c mobility on unit and over uneven surfaces including navigating through doorways, thresholds and over carpeted surfaces at supervision level and neuro re-ed for BLE during sit to stands with Stedy (introduced as another transfer method to increase use of BLE in standing), working on slowed movement of BLE to address coordination, and sitting balance to doff shoes and socks before returning back to bed to rest. Pt required min A for balance with 1 UE support and up to mod A without UE support. Positioned for comfort in supine and all needs in reach.   Therapy Documentation Precautions:  Precautions Precautions: Fall, Back Restrictions Weight Bearing Restrictions: No  Pain:  Reports pain in back is manageable. C/o pain in sternal/chest area - RN aware   See Function Navigator for Current Functional Status.   Therapy/Group: Individual Therapy  [name], PT, DPT  02/19/2015, 3:44 PM

## 2015-02-19 NOTE — Progress Notes (Signed)
VASCULAR LAB PRELIMINARY  PRELIMINARY  PRELIMINARY  PRELIMINARY  Bilateral lower extremity venous duplex completed.    Preliminary report:  There is no DVT or SVT noted in the bilateral lower extremities.   Graves Nipp, RVT 02/19/2015, 1:45 PM

## 2015-02-19 NOTE — Progress Notes (Signed)
Pt. Got the soup enema with no results,only a smear.Keep monitoring pt. Closely. Pt. Was cath for 500 ,she did not void since the foley cath was removed.Keep assessing pt. Closely.

## 2015-02-19 NOTE — Evaluation (Signed)
Physical Therapy Assessment and Plan  Patient Details  Name: Heather [name] MRN: [id number] Date of Birth: 1945-08-19  PT Diagnosis: Abnormal posture, Ataxia, Difficulty walking, Impaired sensation, Muscle spasms, Muscle weakness, Paraplegia and Pain in chest and back Rehab Potential: Good ELOS: 15-21 days   Today's Date: 02/19/2015 PT Individual Time: 0800-0900 PT Individual Time Calculation (min): 60 min    Problem List:  Patient Active Problem List   Diagnosis Date Noted  . Thoracic myelopathy 02/18/2015  . Postoperative anemia due to acute blood loss 02/17/2015  . Spinal cord compression due to malignant neoplasm metastatic to spine ([place]) 02/15/2015  . Epidural mass 02/15/2015  . Thoracic spine tumor   . Pathologic fracture of vertebra 02/14/2015  . Pathologic fracture of thoracic vertebrae 02/14/2015  . Breast cancer metastasized to bone ([place]) 02/14/2015  . Hyperlipidemia 02/14/2015  . H. pylori infection   . HTN (hypertension) 04/17/2012  . Primary cancer of upper outer quadrant of right female breast ([place]) 03/08/2012    Past Medical History:  Past Medical History  Diagnosis Date  . Chronic kidney disease   . Hx: UTI (urinary tract infection)   . Right shoulder pain   . Right knee pain   . Hypertension     recently started Aldactone   . Hyperlipidemia     but not on meds;diet and exercise controlled  . Dizziness     has been going on for 49month;medical MD aware  . H/O hiatal hernia   . GERD (gastroesophageal reflux disease)     Tums prn  . Hemorrhoids   . Urinary frequency     d/t taking Aldactone  . History of UTI   . Anemia     hx of  . Cataract     immature-not sure of which eye  . Anxiety     r/t updated surgery  . Insomnia     takes Melatoniin daily  . Complication of anesthesia     pt states she is sensitive to meds  . [place]     right  . Skin cancer   . Hx of radiation therapy 10/25/12- 12/11/12    right chest wall/regional lymph  nodes 5040 cGy, 28 sessions, right chest wall boost 1000 cGy 1 session  . H. pylori infection    Past Surgical History:  Past Surgical History  Procedure Laterality Date  . Cyst removed from left breast  1970  . Left wrist surgery   2004    with plate  . Colonoscopy    . Esophagogastroduodenoscopy    . Mastectomy, radical    . Mastectomy modified radical  05/10/2012    Procedure: MASTECTOMY MODIFIED RADICAL;  Surgeon: PMerrie Roof MD;  [facility]  Service: General;  Laterality: Right;  RIGHT MODIFIED RADICAL MASTECTOMY  . Portacath placement  05/10/2012    Procedure: INSERTION PORT-A-CATH;  Surgeon: PMerrie Roof MD;  [place] [facility]  Service: General;  Laterality: Left;  . Decompressive lumbar laminectomy level 4 N/A 02/15/2015    Procedure: DECOMPRESSION T5 AND T3-T7 STABALIZATION;  Surgeon: NConsuella Lose MD;  [facility];  Service: Neurosurgery;  Laterality: N/A;    Assessment & Plan Clinical Impression: Patient is a 69y.o. year old right handed female with history of HTN, CKD, recent gastritis, R-breast cancer s/p mastectomy, with chemo/XRT 2013 with neuropathy and adjuvant therapy with letrozole. She was admitted on 02/14/2015 with reports of numbness from chest down with pain since July  and one week history of BLE weakness, balance problems progressing to inability to walk, difficulty voiding and constipation. MRI brain without evidence of metastatic disease. MRI C-T-L-spine with large destructive T5 lesion with severe pathologic compression and extensive epidural tumor as well as indeterminate scattered foci of enhancement in thoracic and lumbar spine. She was evaluated by Dr. Kathyrn Sheriff who recommended decompressive surgery. She was started on decadron and underwent T5 laminectomy with decompression and T3-T5 screw stabilization on 02/16/15. [name] consulted for input and recommended palliative radiation to spine as well as PET CT for re-staging after  discharge. Pathology positive for metastatic cancer due to breast primary. Hospital course complicated by pain management, hyponatremia, transaminitis, ABLA . Subcutaneous Lovenox for DVT prophylaxis. Acute blood loss anemia 9.5 and monitored. Post op with ABLA and has had improvement in BLE strength. PT/OT ongoing and CIR recommended for follow up therapy. Pt lives in a 1 story home with her husband, who is able to provide superversion, but only partial physical support. He has had therapy before when she was not able to use her hand and had great results, so she hoping to gain the same benefit with IPR. Patient transferred to CIR on 02/18/2015 .   Patient currently requires mod assist for transfers and +2 assist for gait with mobility secondary to muscle weakness, muscle joint tightness and muscle paralysis, decreased cardiorespiratoy endurance, impaired timing and sequencing, unbalanced muscle activation, ataxia and decreased coordination and decreased sitting balance, decreased standing balance, decreased postural control and decreased balance strategies.  Prior to hospitalization, patient was independent  with mobility and lived with Spouse in a House home.  Home access is 1Stairs to enter.  Patient will benefit from skilled PT intervention to maximize safe functional mobility, minimize fall risk and decrease caregiver burden for planned discharge home with 24 hour supervision.  Anticipate patient will benefit from follow up [place] at discharge.  PT - End of Session Activity Tolerance: Decreased this session Endurance Deficit: Yes Endurance Deficit Description: requires frequent rest breaks PT Assessment Rehab Potential (ACUTE/IP ONLY): Good PT Patient demonstrates impairments in the following area(s): Balance;Endurance;Motor;Pain;Sensory;Skin Integrity PT Transfers Functional Problem(s): Bed Mobility;Bed to Chair;Car;Furniture PT Locomotion Functional Problem(s): Ambulation;Wheelchair  Mobility;Stairs PT Plan PT Intensity: Minimum of 1-2 x/day ,45 to 90 minutes PT Frequency: 5 out of 7 days PT Duration Estimated Length of Stay: 15-21 days PT Treatment/Interventions: Training and development officer;Ambulation/gait training;Community reintegration;Discharge planning;Disease management/prevention;DME/adaptive equipment instruction;Functional mobility training;Neuromuscular re-education;Pain management;Patient/family education;Psychosocial support;Skin care/wound management;Splinting/orthotics;Stair training;Therapeutic Activities;Therapeutic Exercise;UE/LE Strength taining/ROM;UE/LE Coordination activities;Wheelchair propulsion/positioning PT Transfers Anticipated Outcome(s): mod I basic transfers; min A car PT Locomotion Anticipated Outcome(s): mod I w/c mobility; min assist gait and stairs PT Recommendation Recommendations for Other Services: Neuropsych consult Follow Up Recommendations: Home health PT;24 hour supervision/assistance Patient destination: Home Equipment Recommended: Wheelchair (measurements);Wheelchair cushion (measurements) Equipment Details: Pt has RW  Skilled Therapeutic Intervention Individual treatment initiated with focus on orientation to CIR and PT POC/goals with patient, education on SCI per patient questions, addressed neuro re-ed to address postural control, WB through BLE, coordination, and gait/balance, bed mobility and transfer training (squat pivot and sit to stands with RW) with focus on technique, weightshifting, and safety, and introduced w/c propulsion and parts management in regards to functional mobility and preparing for transfers. Patient somewhat anxious during evaluation with time spent providing encouragement and education in regards to rehab process.   PT Evaluation Precautions/Restrictions Precautions Precautions: Fall (No orders for back precautions; need to clarify) Restrictions Weight Bearing Restrictions: No Pain Pain  Assessment Pain Assessment: 0-10 Pain Score: 9  Pain Type: Acute pain Pain Location: Rib cage Pain Orientation: Right;Left;Mid Pain Descriptors / Indicators: Aching Pain Frequency: Intermittent Pain Onset: On-going Patients Stated Pain Goal: 2 Pain Intervention(s): Medication (See eMAR);Repositioned Multiple Pain Sites: No Home Living/Prior Functioning Home Living Available Help at Discharge: Family;Available 24 hours/day Type of Home: House Home Access: Stairs to enter [employer] of Steps: 1 Entrance Stairs-Rails: None Home Layout: Able to live on main level with bedroom/bathroom;Two level Bathroom Shower/Tub: Camera operator Comments: Used RW for 1 week PTA  Lives With: Spouse Prior Function Level of Independence: Independent with homemaking with ambulation;Independent with basic ADLs;Independent with gait;Independent with transfers  Able to Take Stairs?: Yes Driving: Yes Comments: Went to the [facility] regularly to work out [employer] Overall Cognitive Status: Within Functional Limits for tasks assessed Arousal/Alertness: Awake/alert Orientation Level: Oriented X4 Safety/Judgment: Appears intact Comments: Somewhat anxious. Encouragement provided and cues for pursed lip breathing Sensation Sensation Light Touch: Impaired Detail Light Touch Impaired Details: Impaired RLE;Impaired LLE (decreased going distally) Proprioception: Impaired Detail Proprioception Impaired Details: Impaired RLE;Impaired LLE Coordination Gross Motor Movements are Fluid and Coordinated: No (BLE) Motor  Motor Motor: Ataxia;Abnormal postural alignment and control;Other (comment) (Paraparesis (incomplete))  Trunk/Postural Assessment  Cervical Assessment Cervical Assessment: Within Functional Limits Thoracic Assessment Thoracic Assessment: Exceptions to [facility] (kyphosis;) Lumbar Assessment Lumbar Assessment: Exceptions to [facility] (posterior pelvic tilt;) Postural Control Postural  Control: Deficits on evaluation Trunk Control: impaired due to SCI  Balance Balance Balance Assessed: Yes Static Sitting Balance Static Sitting - Level of Assistance: 5: Stand by assistance Dynamic Sitting Balance Dynamic Sitting - Level of Assistance: 4: Min assist Static Standing Balance Static Standing - Level of Assistance: 4: Min assist;3: Mod assist Dynamic Standing Balance Dynamic Standing - Level of Assistance: 3: Mod assist;2: Max assist Extremity Assessment      RLE Assessment RLE Assessment: Exceptions to [facility] RLE Strength RLE Overall Strength Comments: grossly 3 to 3-/5, 3-/5 dorsiflexion (tightness at heel cord noted also); impaired coordination LLE Assessment LLE Assessment: Exceptions to [facility] LLE Strength LLE Overall Strength Comments: grossly 3 to 3-/5; deceased coordination noted   See Function Navigator for Current Functional Status.   Refer to Care Plan for Long Term Goals  Recommendations for other services: Neuropsych  Discharge Criteria: Patient will be discharged from PT if patient refuses treatment 3 consecutive times without medical reason, if treatment goals not met, if there is a change in medical status, if patient makes no progress towards goals or if patient is discharged from hospital.  The above assessment, treatment plan, treatment alternatives and goals were discussed and mutually agreed upon: by patient  Juanna Cao, PT, DPT  02/19/2015, 9:15 AM

## 2015-02-19 NOTE — Progress Notes (Signed)
Pts LBM 02/14/2015. Enema and Miralax give as ordered. No results. Pt is passing gas. Will continue to monitor. Michelene Heady, RN

## 2015-02-19 NOTE — Progress Notes (Signed)
Physical Therapy Session Note  Patient Details  Name: Heather [name] MRN: [id number] Date of Birth: 1945-10-18  Today's Date: 02/19/2015 PT Individual Time: 0930-1030 PT Individual Time Calculation (min): 60 min   Short Term Goals: Week 1:  PT Short Term Goal 1 (Week 1): Pt will be able to perform basic transfers at min assist level PT Short Term Goal 2 (Week 1): Pt will be able to gait x 30' with mod assist of 1 PT Short Term Goal 3 (Week 1): Pt will be able to demonstrate dynamic standing balance during functional task with 1 UE support with mod assist  Skilled Therapeutic Interventions/Progress Updates:  Pt received up in w/c - agreeable to PT, but reporting fatigue. W/C Management - see function tab for details - PT gives pt verbal cues for UE use to steer. Therapeutic Activity - see function tab for details re: sit to stand, transfers, bed mobility, and car transfer. Pt demonstrates poor proprioception and motor control req tot A with LOB due to legs getting twisted underneath her when stand-stepping in/out of car. Pt reports she has a [employer]aru at home. Pt educated re: log rolling and sit to supine via side lie. Gait Training - PT instructs pt in attempting 1 low step (3" height) with B hand rails in up with L (strong) leg and down with R leg backwards - pt req tot A due to impaired balance and inability to place R leg fully on step - pt demonstrates single leg stance on L LE for ~7 seconds while attempting to get R LE onto step - PT unable to release gait belt with one hand to assist R leg in placement, so pt assisted back to sitting in w/c from first step. Pt ended in bed with all needs in reach, resting for 30 minutes before OT session. Continue per PT POC.    Therapy Documentation Precautions:  Precautions Precautions: Fall (No orders for back precautions; need to clarify) Restrictions Weight Bearing Restrictions: No Pain: Pain Assessment Pain Assessment: 0-10 Pain Score: 6  Pain  Type: Acute pain Pain Location: Chest Pain Orientation: Right;Left;Mid Pain Descriptors / Indicators: Radiating Pain Frequency: Intermittent Pain Onset: On-going Patients Stated Pain Goal: 2 Pain Intervention(s): Medication (See eMAR);Rest;Other (Comment) (education re: spinal precautions) Multiple Pain Sites: No   See Function Navigator for Current Functional Status.   Therapy/Group: Individual Therapy  Aisling Emigh M 02/19/2015, 9:38 AM

## 2015-02-20 ENCOUNTER — Inpatient Hospital Stay (HOSPITAL_COMMUNITY): Payer: Medicare Other | Admitting: Occupational Therapy

## 2015-02-20 ENCOUNTER — Inpatient Hospital Stay (HOSPITAL_COMMUNITY): Payer: Medicare Other | Admitting: Physical Therapy

## 2015-02-20 DIAGNOSIS — D0590 Unspecified type of carcinoma in situ of unspecified breast: Secondary | ICD-10-CM

## 2015-02-20 DIAGNOSIS — G8220 Paraplegia, unspecified: Secondary | ICD-10-CM | POA: Diagnosis present

## 2015-02-20 DIAGNOSIS — K592 Neurogenic bowel, not elsewhere classified: Secondary | ICD-10-CM | POA: Diagnosis present

## 2015-02-20 DIAGNOSIS — N319 Neuromuscular dysfunction of bladder, unspecified: Secondary | ICD-10-CM | POA: Diagnosis present

## 2015-02-20 LAB — URINALYSIS, ROUTINE W REFLEX MICROSCOPIC
Bilirubin Urine: NEGATIVE
Glucose, UA: 500 mg/dL — AB
Hgb urine dipstick: NEGATIVE
Ketones, ur: NEGATIVE mg/dL
Leukocytes, UA: NEGATIVE
Nitrite: NEGATIVE
Protein, ur: NEGATIVE mg/dL
Specific Gravity, Urine: 1.015 (ref 1.005–1.030)
Urobilinogen, UA: 0.2 mg/dL (ref 0.0–1.0)
pH: 6 (ref 5.0–8.0)

## 2015-02-20 MED ORDER — GABAPENTIN 100 MG PO CAPS
200.0000 mg | ORAL_CAPSULE | Freq: Three times a day (TID) | ORAL | Status: DC
  Administered 2015-02-20 – 2015-02-25 (×15): 200 mg via ORAL
  Filled 2015-02-20 (×15): qty 2

## 2015-02-20 MED ORDER — SORBITOL 70 % SOLN
60.0000 mL | Freq: Once | Status: AC
  Administered 2015-02-20: 60 mL via ORAL
  Filled 2015-02-20: qty 60

## 2015-02-20 MED ORDER — SORBITOL 70 % SOLN
960.0000 mL | TOPICAL_OIL | Freq: Once | ORAL | Status: DC
  Filled 2015-02-20: qty 240

## 2015-02-20 NOTE — Progress Notes (Signed)
Occupational Therapy Session Note  Patient Details  Name: Heather [name] MRN: [id number] Date of Birth: 1945-10-10  Today's Date: 02/20/2015 OT Individual Time: 1540-0867 and 1304-1400 OT Individual Time Calculation (min): 60 min    Short Term Goals: Week 1:  OT Short Term Goal 1 (Week 1): Pt will transfer to [facility] with min A OT Short Term Goal 2 (Week 1): Pt will complete LB dressing task with steadying assist OT Short Term Goal 3 (Week 1): Pt will maintain dynamic standing balance with steadying assist in prep for LB dressing task OT Short Term Goal 4 (Week 1): Pt will display carry over of education in functional tasks with min questioning cues  Skilled Therapeutic Interventions/Progress Updates:    Session One: Pt seen for OT ADL bathing and dressing session. Pt in supine upon arrival, agreeable to tx session. She completed squat pivot transfers with mod-max assist throughout session with VCs for technique/ sequencing. Pt with strong posterior lean during transfers and hyperextension of B LEs. She bathed seated on 3-1 BSC using LH sponge to maintain spinal pre-cautions. Pt felt need for BM while seated on BSC and had small BM. She dressed seated in w/c at the sink, with increased assist provided for time management. Pt left sitting at sink at end of session, husband present and RN aware of position. [name] present during session and educated regarding OT goals, POC, CIR, care continuum, sensory loss and d/c planning.   Session Two: Pt seen for OT therapy session focusing on ADL re-training, AE training, and functional standing balance. Pt in supine upon arrival, agreeable to tx session. Sitting in w/c, pt educated regarding sock aid and LH shoe horn. Following demonstration, pt successfully used sock aid. She donned shoe with min A due to decreased sensation to know if shoe was fitting correctly. Pt fatigued and frustrated following LB dressing task. Encouragement provided and cues for  deep breathing. Pt taken to therapy gym total A for time and energy conservation. In therapy gym, pt stood at side of  Parallel bars. #2 ankle weights placed B for increased proprioception. She required min A for sit > stand and required mod A for static standing balance with blocking of R knee. Pt with strong forward lean when standing requiring VCs for weight shift. She was unable to pick up foot to re-position while in standing. Mirror placed in front of pt for visual feed back. Completed x2 trials with pt tolerating ~20 seconds of static standing with B UE support. Pt completed toe tapping exercises with emphasis on LE strength and coordination, demonstrating LE weakness R>L. Pt returned to room at end of session, left with hand off to NT for toileting task.   Therapy Documentation Precautions:  Precautions Precautions: Fall, Back Restrictions Weight Bearing Restrictions: No Pain: Pain Assessment Pain Assessment: 0-10 Pain Score: 6  Pain Type: Neuropathic pain Pain Location: Chest Pain Orientation: Mid Pain Descriptors / Indicators: Aching;Burning Pain Onset: On-going Pain Intervention(s): RN made aware;Rest (RN present to administer meds)  See Function Navigator for Current Functional Status.   Therapy/Group: Individual Therapy  Lewis, Lanaya Bennis C 02/20/2015, 7:12 AM

## 2015-02-20 NOTE — Progress Notes (Addendum)
Physical Therapy Session Note  Patient Details  Name: Heather [name] MRN: [id number] Date of Birth: 1946/04/24  Today's Date: 02/20/2015 PT Individual Time: 1000-1105 and 1540-1640 PT Individual Time Calculation (min): 65 min and 55 min   Short Term Goals: Week 1:  PT Short Term Goal 1 (Week 1): Pt will be able to perform basic transfers at min assist level PT Short Term Goal 2 (Week 1): Pt will be able to gait x 30' with mod assist of 1 PT Short Term Goal 3 (Week 1): Pt will be able to demonstrate dynamic standing balance during functional task with 1 UE support with mod assist  Skilled Therapeutic Interventions/Progress Updates:   Session 1: Focused on NMR to address postural control, WB through BLE, coordination, gait, stairs, activity tolerance, and patient education. Patient received on toilet via [place], able to pull up with BUE with S-min A and maintain standing in [place] for total A hygiene and clothing management. Patient transferred to wheelchair and propelled to/from therapy gym using BUE with supervision. Added apple board to increase stability in wheelchair and lowered specialty back to promote upright posture and provide increased lower lumbar support for improved alignment and sitting tolerance. Performed squat pivot transfers with mod A. Patient required max multimodal cues to use BUE and push through BLE to elevate hips to scoot forward and backward in chair in preparation for transfers and standing to decrease shearing on buttocks to prevent skin breakdown. Added 2# wts to BLE with pt reporting improved awareness of position of BLE with functional mobility. Gait using RW x 15 ft + 10 ft with mod A x 2 and seated rest between trials. Patient utilized mirror in front to provide visual feedback due to decreased proprioception and promote forward gaze and required max multimodal cues for decreased L step length, shifting weight forward over BLE, safe advancement of RW, and R foot clearance.  Patient with 2 episodes of R knee buckling. Stair training up/down five 3" steps using 2 rails with max A x 2, ascending forwards and descending backwards with total A to move/place RLE on descent and max multimodal cues for sequencing, technique, weight shifting. During rest breaks, extensive education provided regarding proprioception/sensation and impact on functional mobility, postural control, prevention of skin breakdown. Patient's husband to bring in measurements for bed height. Patient requested to return to bed at end of session, transferred via [facility] and sit > long sit > supine with supervision and patient able to maintain back precautions. Patient left semi reclined in bed with all needs within reach, husband present.   Session 2: Husband present to observe session. Focus on BLE NMR, knee control in stance, activity tolerance. Patient propelled wheelchair using BUE 2 x 165 ft with S and assist for management of leg rests to maintain back precautions. Performed squat pivot transfers wheelchair <> mat table with min A overall and verbal cues for sequencing and technique. Patient instructed in log rolling technique for bed mobility on mat table to L with initial demonstration and max verbal cues for technique, max A overall. Patient with increased anxiety/fear of feeling like she was falling forward and prefers sit <> long sit <> supine technique. Semi reclined on wedge, performed BLE NMR: hooklying hip abduction (assisted)/adduction x 10 each LE, assisted SLR x 10 each LE, assisted heel slides with maxislide to decrease friction x 10 each LE. Patient required verbal/tactile cues for control of movement. Sit <> stand with max A x 1 using RW with cues  for technique and hand placement. To facilitate increased B knee control, instructed patient in performing mini squats using RW with therapist blocking patient from locking L knee into hyperextension x 10, +2 for safety due to instability/knee buckling.  Patient became tearful, stating she doesn't realize how much is "gone" until she does therapy and requested to return to room. Emotional support provided and patient educated on purpose and goals of therapy, verbalized understanding. Patient returned to room and left sitting in wheelchair with all needs within reach, husband present.   Therapy Documentation Precautions:  Precautions Precautions: Fall, Back Restrictions Weight Bearing Restrictions: No Pain: Pain Assessment Pain Assessment: 0-10 Pain Score: 6  Pain Type: Neuropathic pain Pain Location: Chest Pain Orientation: Mid Pain Descriptors / Indicators: Aching;Burning Pain Onset: On-going Pain Intervention(s): RN made aware;Rest (RN present to administer meds)  See Function Navigator for Current Functional Status.   Therapy/Group: Individual Therapy  Laretta Alstrom 02/20/2015, 11:43 AM

## 2015-02-20 NOTE — IPOC Note (Addendum)
Overall Plan of Care [facility]) Patient Details Name: Heather [name] MRN: [id number] DOB: Oct 27, 1945  Admitting Diagnosis: decompression , myelopathy   Hospital Problems: Principal Problem:   Paraplegia at T4 level [facility]) Active Problems:   Primary cancer of upper outer quadrant of right female breast ([place])   Spinal cord compression due to malignant neoplasm metastatic to spine ([place])   Postoperative anemia due to acute blood loss   Neurogenic bowel   Neurogenic bladder     Functional Problem List: Nursing Bowel, Endurance, Medication Management, Safety, Pain, Skin Integrity, Bladder  PT Balance, Endurance, Motor, Pain, Sensory, Skin Integrity  OT Balance, Pain, Endurance, Motor, Behavior, Safety, Sensory, Perception  SLP    TR Activity tolerance, functional mobility, balance, safety, pain, anxiety/stress        Basic ADL's: OT Grooming, Bathing, Dressing, Toileting     Advanced  ADL's: OT Simple Meal Preparation, Laundry     Transfers: PT Bed Mobility, Bed to Chair, Musician, Manufacturing systems engineer, Metallurgist: PT Ambulation, Emergency planning/management officer, Stairs     Additional Impairments: OT Fuctional Use of Upper Extremity  SLP        TR  community skills    Anticipated Outcomes Item Anticipated Outcome  Self Feeding Independent  Swallowing      Basic self-care  Supervision  Toileting  Supervision   Bathroom Transfers Mod I toilet transfers; Supervision shower transfers  Bowel/Bladder  continent of bladder and bowel   Transfers  mod I basic transfers; min A car  Locomotion  mod I w/c mobility; min assist gait and stairs  Communication     Cognition     Pain  Pain <3   Safety/Judgment  Mod I    Therapy Plan: PT Intensity: Minimum of 1-2 x/day ,45 to 90 minutes PT Frequency: 5 out of 7 days PT Duration Estimated Length of Stay: 15-21 days OT Intensity: Minimum of 1-2 x/day, 45 to 90 minutes OT Frequency: 5 out of 7 days OT Duration/Estimated  Length of Stay: 15-21 days  TR Duration/ELOS:  2.5 weeks TR Frequency:  Min 1 time per week >20 minutes        Team Interventions: Nursing Interventions Patient/Family Education, Bowel Management, Bladder Management, Pain Management, Disease Management/Prevention, Medication Management, Skin Care/Wound Management, Discharge Planning, Psychosocial Support  PT interventions Balance/vestibular training, Ambulation/gait training, Community reintegration, Discharge planning, Disease management/prevention, DME/adaptive equipment instruction, Functional mobility training, Neuromuscular re-education, Pain management, Patient/family education, Psychosocial support, Skin care/wound management, Splinting/orthotics, Stair training, Therapeutic Activities, Therapeutic Exercise, UE/LE Strength taining/ROM, UE/LE Coordination activities, Wheelchair propulsion/positioning  OT Interventions Training and development officer, Discharge planning, Community reintegration, Engineer, drilling, Functional mobility training, Neuromuscular re-education, Pain management, Psychosocial support, Patient/family education, Self Care/advanced ADL retraining, Therapeutic Activities, Therapeutic Exercise, UE/LE Strength taining/ROM, UE/LE Coordination activities, Wheelchair propulsion/positioning  SLP Interventions    TR Interventions Recreation/leisure participation, Balance/Vestibular training, functional mobility, therapeutic activities, UE/LE strength/coordination, w/c mobility, community reintegration, pt/family education, adaptive equipment instruction/use, discharge planning, psychosocial support  SW/CM Interventions      Team Discharge Planning: Destination: PT-Home ,OT- Home , SLP-  Projected Follow-up: PT-Home health PT, 24 hour supervision/assistance, OT-  Home health OT, SLP-  Projected Equipment Needs: PT-Wheelchair (measurements), Wheelchair cushion (measurements), OT- To be determined, SLP-  Equipment  Details: PT-Pt has RW, OT-  Patient/family involved in discharge planning: PT- Patient,  OT-Patient, SLP-   MD ELOS: 15-21 days Medical Rehab Prognosis:  Excellent Assessment: The patient has been admitted for CIR therapies with the  diagnosis of thoracic myelopathy due to pathologic fracture. The team will be addressing functional mobility, strength, stamina, balance, safety, adaptive techniques and equipment, self-care, bowel and bladder mgt, patient and caregiver education, NMR, ego support, SCI education, back precautions, pain mgt, community reintegration. Goals have been set at supervision to mod I for basic self-care /ADL's and mod I for transfers and w/c mobility, min assist for gait. Pt is very motivated!Meredith Staggers, MD, FAAPMR      See Team Conference Notes for weekly updates to the plan of care

## 2015-02-20 NOTE — Progress Notes (Signed)
[place] PHYSICAL MEDICINE & REHABILITATION     PROGRESS NOTE    Subjective/Complaints: Had a good day. Wasn't sure that she did "real well" with therapy. Chest wall sore with more activity. Began to empty bladder but had difficulties with bowel. SSE ineffective---feels bloated.  ROS: mild anxiety. Pt denies fever, rash/itching, headache, blurred or double vision, nausea, vomiting,  diarrhea, chest pain, shortness of breath, palpitations, dysuria, dizziness,  , bleeding,  , or depression   Objective: Vital Signs: Blood pressure 129/63, pulse 77, temperature 98.4 F (36.9 C), temperature source Oral, resp. rate 18, height '5\' 4"'$  (1.626 m), weight 59.194 kg (130 lb 8 oz), SpO2 99 %. No results found.  Recent Labs  02/18/15 0659 02/19/15 0549  WBC 6.1 4.8  HGB 9.7* 10.0*  HCT 29.5* 30.3*  PLT 185 221    Recent Labs  02/18/15 0659 02/19/15 0549  NA 137 134*  K 4.2 3.9  CL 96* 96*  GLUCOSE 141* 139*  BUN 18 20  CREATININE 0.77 0.74  CALCIUM 9.4 9.3   CBG (last 3)  No results for input(s): GLUCAP in the last 72 hours.  Wt Readings from Last 3 Encounters:  02/18/15 59.194 kg (130 lb 8 oz)  02/14/15 58.968 kg (130 lb)  02/14/15 58.968 kg (130 lb)    Physical Exam:  Constitutional: She is oriented to person, place, and time. She appears well-developed and well-nourished. No distress.  HENT: dentition intact Head: Normocephalic and atraumatic.  Mouth/Throat: Oropharynx is clear and moist.  Eyes: Conjunctivae and EOM are normal. Pupils are equal, round, and reactive to light.  Neck: Normal range of motion. Neck supple.  Cardiovascular: Normal rate and regular rhythm.  No murmur heard. Respiratory: Effort normal and breath sounds normal.  GI: Soft. Bowel sounds are normal. She exhibits no distension. There is no tenderness.  Musculoskeletal: She exhibits no edema or tenderness.     Neurological: She is alert and oriented to person, place, and time. She displays  3+ dtr's at knees/ankles. Coordination (BLE ataxic) abnormal. UE grossly 4 to 4+/5 with inhibition proximally due to pain. LE: 3/5 hf,ke and 3 to 3+ adf/apf.  Sensation remains decreased below T5-6, does sense gross touch but has difficulty distinguishing hot/cold/pain. Impaired proprioception Skin: Skin is warm and dry. No rash noted. No erythema.  Upper thoracic incision with honeycomb dressing--no redness, drainage or tenderness.  Psych: mild anxiety  Assessment/Plan: 1. Functional deficits secondary to T5 Cord compression/myelopathy due compression fx, breast cancer which require 3+ hours per day of interdisciplinary therapy in a comprehensive inpatient rehab setting. Physiatrist is providing close team supervision and 24 hour management of active medical problems listed below. Physiatrist and rehab team continue to assess barriers to discharge/monitor patient progress toward functional and medical goals.  Function:  Bathing Bathing position   Position: Shower  Bathing parts Body parts bathed by patient: Right arm, Left arm, Chest, Abdomen, Right upper leg, Left upper leg, Front perineal area, Right lower leg, Left lower leg, Back Body parts bathed by helper: Buttocks  Bathing assist Assist Level: Touching or steadying assistance(Pt > 75%)      Upper Body Dressing/Undressing Upper body dressing   What is the patient wearing?: Pull over shirt/dress     Pull over shirt/dress - Perfomed by patient: Thread/unthread right sleeve, Thread/unthread left sleeve, Put head through opening, Pull shirt over trunk          Upper body assist Assist Level: Set up   Set up : To  obtain clothing/put away  Lower Body Dressing/Undressing Lower body dressing   What is the patient wearing?: Pants, Socks       Pants- Performed by helper: Thread/unthread right pants leg, Thread/unthread left pants leg, Pull pants up/down       Socks - Performed by helper: Don/doff right sock, Don/doff left  sock              Lower body assist        Toileting Toileting     Toileting steps completed by helper: Adjust clothing prior to toileting, Performs perineal hygiene, Adjust clothing after toileting Toileting Assistive Devices: Other (comment) (Standing at [place])  Toileting assist     Transfers Chair/bed transfer   Chair/bed transfer method: Stand pivot Chair/bed transfer assist level: Maximal assist (Pt 25 - 49%/lift and lower) Chair/bed transfer assistive device: Armrests, Medical sales representative     Max distance: 10 Assist level: 2 helpers   Wheelchair   Type: Manual Max wheelchair distance: 300' Assist Level: Touching or steadying assistance (Pt > 75%)  Cognition Comprehension Comprehension assist level: Follows basic conversation/direction with no assist  Expression Expression assist level: Expresses complex ideas: With extra time/assistive device  Social Interaction Social Interaction assist level: Interacts appropriately with others - No medications needed.  Problem Solving Problem solving assist level: Solves complex problems: With extra time  Memory Memory assist level: Complete Independence: No helper   Medical Problem List and Plan: 1. Functional deficits secondary to T5 cord compression/myelopathy from pathologic compression fracture  -continue therapies. Provided positive reinforcement 2. DVT Prophylaxis/Anticoagulation: Pharmaceutical: Lovenox indicated due to immobility/hypercoaguable state  -platelets 221k 3. Pain Management: . Continue oxycodone prn.   -increase gabapentin to '200mg'$  tid starting with the next dose, suspect T5 radiculopathy causing band like pain at left breast/chest wall 4. Adjustment reaction with anxiety/ Mood:   -continue low dose Xanax to help with anxiety attacks.   -Team to provide ego support. Provided patient positive information regarding potential today  -LCSW to follow for evaluation and support.  5. Neuropsych:  This patient is capable of making decisions on her own behalf. 6. Skin/Wound Care: Routine pressure relief measures. Maintain adequate nutrition and hydration status.     7. Fluids/Electrolytes/Nutrition: Monitor I/O. Encourage po  8. ABLA: I personally reviewed the patient's labs today. hgb trending up  -pt asked not to be on Fe++ supp  -follow up blood work next week  9. HTN: Monitor BID. Continue aldactone daily and titrate as needed.  10. Mild chronic gastritis: Continue Pepcid bid.   -bears watching while on decadron, some of chest wall, hypogastric sx may be related to this.  11. Neurogenic bowel and bladder:sorbitol and SMOG enema if no bm by end of therapies today.  -voiding trial starting, cathed only once for 500 cc  -still needs UA/UCS.    LOS (Days) 2 A FACE TO FACE EVALUATION WAS PERFORMED  SWARTZ,ZACHARY T 02/20/2015 9:58 AM

## 2015-02-20 NOTE — Progress Notes (Signed)
Patient information reviewed and entered into eRehab system by Kaytee Taliercio, RN, CRRN, PPS Coordinator.  Information including medical coding and functional independence measure will be reviewed and updated through discharge.     Per nursing patient was given "Data Collection Information Summary for Patients in Inpatient Rehabilitation Facilities with attached "Privacy Act Statement-Health Care Records" upon admission.  

## 2015-02-21 ENCOUNTER — Inpatient Hospital Stay (HOSPITAL_COMMUNITY): Payer: Medicare Other

## 2015-02-21 NOTE — Progress Notes (Signed)
Occupational Therapy Session Note  Patient Details  Name: Heather [name] MRN: [id number] Date of Birth: 1945-07-03  Today's Date: 02/21/2015 OT Individual Time: 1300-1330 OT Individual Time Calculation (min): 30 min    Short Term Goals: Week 1:  OT Short Term Goal 1 (Week 1): Pt will transfer to [facility] with min A OT Short Term Goal 2 (Week 1): Pt will complete LB dressing task with steadying assist OT Short Term Goal 3 (Week 1): Pt will maintain dynamic standing balance with steadying assist in prep for LB dressing task OT Short Term Goal 4 (Week 1): Pt will display carry over of education in functional tasks with min questioning cues  Skilled Therapeutic Interventions/Progress Updates:    Pt seen for OT therapy session focusing on functional transfers and ADl re-training. Pt sitting on BSC with STEDY placed upon arrival. She stood from toilet with min A while pulling up on steady. With steadying assist, pt able to pull pants up, requiring seated rest break in order to complete task. Pt became tearful and anxious during task. VCs provided for deep breathing and to calm pt down. Pt very anxious and discouraged by perceived deficits. Educated regarding OT goals, rate of progression, deficits, and role of CIR. She voiced desire to work on bed mobility as she felt she had no LE control while in bed. Pt completed log rolling with min-mod A to complete supine<> EOB. Leg lifter provided and education/ demonstration completed regarding use. Handout of log rolling technique provided to pt. Pt returned to w/c via STEDY at end of session, left sitting in w/c with all needs in reach and friends entering as therapist exited.   Therapy Documentation Precautions:  Precautions Precautions: Fall, Back Restrictions Weight Bearing Restrictions: No  Pain:   8/10 pain in chest. RN aware, repositioned.   See Function Navigator for Current Functional Status.   Therapy/Group: Individual Therapy  Lewis, Shacoya Burkhammer  C 02/21/2015, 12:54 PM

## 2015-02-22 ENCOUNTER — Inpatient Hospital Stay (HOSPITAL_COMMUNITY): Payer: Medicare Other | Admitting: Occupational Therapy

## 2015-02-22 ENCOUNTER — Inpatient Hospital Stay (HOSPITAL_COMMUNITY): Payer: Medicare Other | Admitting: Physical Therapy

## 2015-02-22 DIAGNOSIS — G9520 Unspecified cord compression: Secondary | ICD-10-CM

## 2015-02-22 DIAGNOSIS — C50411 Malignant neoplasm of upper-outer quadrant of right female breast: Secondary | ICD-10-CM

## 2015-02-22 DIAGNOSIS — G839 Paralytic syndrome, unspecified: Secondary | ICD-10-CM

## 2015-02-22 MED ORDER — DEXAMETHASONE 4 MG PO TABS
4.0000 mg | ORAL_TABLET | Freq: Four times a day (QID) | ORAL | Status: DC
  Administered 2015-02-22 – 2015-02-26 (×14): 4 mg via ORAL
  Filled 2015-02-22 (×14): qty 1

## 2015-02-22 NOTE — Progress Notes (Signed)
Occupational Therapy Session Note  Patient Details  Name: Heather [name] MRN: [id number] Date of Birth: 05-05-1946  Today's Date: 02/22/2015 OT Individual Time:  -  0800-0900   (60 min)      Short Term Goals: Week 1:  OT Short Term Goal 1 (Week 1): Pt will transfer to [facility] with min A OT Short Term Goal 2 (Week 1): Pt will complete LB dressing task with steadying assist OT Short Term Goal 3 (Week 1): Pt will maintain dynamic standing balance with steadying assist in prep for LB dressing task OT Short Term Goal 4 (Week 1): Pt will display carry over of education in functional tasks with min questioning cues  Skilled Therapeutic Interventions/Progress Updates:    1st session:     Pt seen for OT ADL bathing and dressing session. Pt isitting on BSC upon OT arrival.  Pt cleaned with lateral leaning on side of bed.  Used Stedy to transfer to 3n1 in shower.   She bathed seated on 3-1 BSC using LH sponge to maintain spinal pre-cautions.  She dressed seated in w/c at the sink, with increased assist provided for time management. Husband present.   Pt. Donned pants with mod assit getting over feet. t able to pull pants up, requiring seated rest break in order to complete task.   2nd session;  Did scoot transfer from wc to mat with min assist.  Practiced scooting posterior and  laterally.  Did, sets of bicep curls with 1.5 wts; , block chess press,  Lateral reaching for balance and UE strength.  Pt had difficulty with taking cups off stack and poor eccentric control.  Husband present.  Pt transferred back to wc with lateral scoot.  Propelled wc to room and left with all needs in reach.   Therapy Documentation Precautions:  Precautions Precautions: Fall, Back Restrictions Weight Bearing Restrictions: No      Pain:  1/10  back          See Function Navigator for Current Functional Status.   Therapy/Group: Individual Therapy  Lisa Roca 02/22/2015, 7:40 AM

## 2015-02-22 NOTE — Progress Notes (Signed)
Physical Therapy Session Note  Patient Details  Name: Heather [name] MRN: [id number] Date of Birth: 05-Nov-1945  Today's Date: 02/22/2015 PT Individual Time: 1300-1330 PT Individual Time Calculation (min): 30 min   Short Term Goals: Week 1:  PT Short Term Goal 1 (Week 1): Pt will be able to perform basic transfers at min assist level PT Short Term Goal 2 (Week 1): Pt will be able to gait x 30' with mod assist of 1 PT Short Term Goal 3 (Week 1): Pt will be able to demonstrate dynamic standing balance during functional task with 1 UE support with mod assist  Skilled Therapeutic Interventions/Progress Updates:  Pt was seen bedside in the pm. Pt propelled w/c to gym with B UEs and S. In gym treatment focused on stand pivot transfers. Pt performed stand pivot transfers w/c to edge of mat and edge of mat to w/c x 3 with rolling walker and mod A with multiple verbal cues. Pt performed sit to stand transfers with mod A and verbal cues x 6 focusing on LE strengthening and balance immediately after standing as well as controlled decent when sitting back down. Pt propelled w/c back to room with B UEs and S.   Therapy Documentation Precautions:  Precautions Precautions: Fall, Back Restrictions Weight Bearing Restrictions: No General:   Pain: No c/o pain.   See Function Navigator for Current Functional Status.   Therapy/Group: Individual Therapy  Dub Amis 02/22/2015, 3:33 PM

## 2015-02-22 NOTE — Progress Notes (Signed)
[place] PHYSICAL MEDICINE & REHABILITATION     PROGRESS NOTE    Subjective/Complaints: Pt asking about her goals while at rehab- discussed that team mtg on Tues to discuss these issues  ROS: mild anxiety. Pt denies pains, she has tingling sensation both feet, feels numb for Chest down   Objective: Vital Signs: Blood pressure 123/50, pulse 72, temperature 98 F (36.7 C), temperature source Oral, resp. rate 18, height '5\' 4"'$  (1.626 m), weight 59.194 kg (130 lb 8 oz), SpO2 97 %. No results found. No results for input(s): WBC, HGB, HCT, PLT in the last 72 hours. No results for input(s): NA, K, CL, GLUCOSE, BUN, CREATININE, CALCIUM in the last 72 hours.  Invalid input(s): CO CBG (last 3)  No results for input(s): GLUCAP in the last 72 hours.  Wt Readings from Last 3 Encounters:  02/18/15 59.194 kg (130 lb 8 oz)  02/14/15 58.968 kg (130 lb)  02/14/15 58.968 kg (130 lb)    Physical Exam:  Constitutional: She is oriented to person, place, and time. She appears well-developed and well-nourished. No distress.  HENT: dentition intact Head: Normocephalic and atraumatic.  Mouth/Throat: Oropharynx is clear and moist.  Eyes: Conjunctivae and EOM are normal. Pupils are equal, round, and reactive to light.  Neck: Normal range of motion. Neck supple.  Cardiovascular: Normal rate and regular rhythm.  No murmur heard. Respiratory: Effort normal and breath sounds normal.  GI: Soft. Bowel sounds are normal. She exhibits no distension. There is no tenderness.  Musculoskeletal: She exhibits no edema or tenderness.     Neurological: She is alert and oriented to person, place, and time. She displays 3+ dtr's at knees/ankles. Coordination (BLE ataxic) abnormal. UE grossly 4 to 4+/5 with inhibition proximally due to pain. LE: 3-/5 hf,ke and 3 to 3+ adf/apf. 2- toe flex and ext Sensation remains decreased below T5-6, does sense gross touch identifies which toes are touched as well Skin: Skin is  warm and dry. No rash noted. No erythema.   Psych: mild anxiety  Assessment/Plan: 1. Functional deficits secondary to T5 Cord compression/myelopathy due compression fx, metastatic breast cancer which require 3+ hours per day of interdisciplinary therapy in a comprehensive inpatient rehab setting. Physiatrist is providing close team supervision and 24 hour management of active medical problems listed below. Physiatrist and rehab team continue to assess barriers to discharge/monitor patient progress toward functional and medical goals.  Function:  Bathing Bathing position   Position: Shower  Bathing parts Body parts bathed by patient: Right arm, Left arm, Chest, Abdomen, Right upper leg, Left upper leg, Front perineal area, Right lower leg, Left lower leg, Back Body parts bathed by helper: Buttocks  Bathing assist Assist Level: Touching or steadying assistance(Pt > 75%)      Upper Body Dressing/Undressing Upper body dressing   What is the patient wearing?: Pull over shirt/dress     Pull over shirt/dress - Perfomed by patient: Thread/unthread right sleeve, Thread/unthread left sleeve, Put head through opening, Pull shirt over trunk          Upper body assist Assist Level: Set up   Set up : To obtain clothing/put away  Lower Body Dressing/Undressing Lower body dressing   What is the patient wearing?: Shoes, Socks       Pants- Performed by helper: Thread/unthread right pants leg, Thread/unthread left pants leg, Pull pants up/down     Socks - Performed by patient: Don/doff right sock, Don/doff left sock Socks - Performed by helper: Don/doff right sock,  Don/doff left sock Shoes - Performed by patient: Fasten right, Fasten left Shoes - Performed by helper: Don/doff right shoe, Don/doff left shoe          Lower body assist        Toileting Toileting   Toileting steps completed by patient: Adjust clothing after toileting Toileting steps completed by helper: Adjust  clothing prior to toileting, Performs perineal hygiene Toileting Assistive Devices:  (in [place])  Toileting assist Assist level: Touching or steadying assistance (Pt.75%)   Transfers Chair/bed transfer   Chair/bed transfer method: Squat pivot Chair/bed transfer assist level: Moderate assist (Pt 50 - 74%/lift or lower) Chair/bed transfer assistive device: Mechanical lift Mechanical lift: Ecologist     Max distance: 15 Assist level: 2 helpers   Wheelchair   Type: Manual Max wheelchair distance: 150 ft Assist Level: Supervision or verbal cues  Cognition Comprehension Comprehension assist level: Follows complex conversation/direction with no assist  Expression Expression assist level: Expresses complex ideas: With no assist  Social Interaction Social Interaction assist level: Interacts appropriately with others - No medications needed.  Problem Solving Problem solving assist level: Solves complex problems: With extra time  Memory Memory assist level: Complete Independence: No helper   Medical Problem List and Plan: 1. Functional deficits secondary to T5 cord compression/myelopathy from pathologic compression fracture, metastatic breast cancer  -continue therapies. Provided positive reinforcement, cont decadron and Femara 2. DVT Prophylaxis/Anticoagulation: Pharmaceutical: Lovenox indicated due to immobility/hypercoaguable state  -platelets 221k 3. Pain Management: . Continue oxycodone prn.   -increase gabapentin to '200mg'$  tid starting with the next dose, suspect T5 radiculopathy causing band like pain at left breast/chest wall 4. Adjustment reaction with anxiety/ Mood:   -continue low dose Xanax to help with anxiety attacks.   -Team to provide ego support. Provided patient positive information regarding potential today  -LCSW to follow for evaluation and support.  5. Neuropsych: This patient is capable of making decisions on her own behalf. 6. Skin/Wound  Care: Routine pressure relief measures. Maintain adequate nutrition and hydration status.     7. Fluids/Electrolytes/Nutrition: Monitor I/O. Encourage po  8. ABLA: post op  -pt asked not to be on Fe++ supp  -follow up blood work next week  9. HTN: Monitor BID. Continue aldactone daily and titrate as needed.  10. Mild chronic gastritis: Continue Pepcid bid.   -bears watching while on decadron, some of chest wall, hypogastric sx may be related to this.  11. Neurogenic bowel and bladder:sorbitol and SMOG enema if no bm by end of therapies today.  -voiding trial starting, cathed only once for 500 cc  - UA neg  LOS (Days) 4 A FACE TO FACE EVALUATION WAS PERFORMED  KIRSTEINS,ANDREW E 02/22/2015 7:43 AM

## 2015-02-22 NOTE — Progress Notes (Signed)
Physical Therapy Session Note  Patient Details  Name: Heather [name] MRN: [id number] Date of Birth: [birth year]  Today's Date: 02/22/2015 PT Individual Time: 1100-1200 PT Individual Time Calculation (min): 60 min   Short Term Goals: Week 1:  PT Short Term Goal 1 (Week 1): Pt will be able to perform basic transfers at min assist level PT Short Term Goal 2 (Week 1): Pt will be able to gait x 30' with mod assist of 1 PT Short Term Goal 3 (Week 1): Pt will be able to demonstrate dynamic standing balance during functional task with 1 UE support with mod assist  Skilled Therapeutic Interventions/Progress Updates:  Pt was seen bedside in the am. Pt propelled w/c about 125 feet with B UEs and S. Pt able to 3/3 back precautions. Pt transferred sit to stand with rolling walker and mod A with verbal cues. Performed LE exercises in w/c 2 lbs on R LE and 3 lbs on L LE, 3 sets x10 reps each for hip flex and LAQs. Improved control noted with ankle weights. Pt performed multiple sit to stand transfers in parallel bars. While standing worked on step ups, 3 sets x 5 reps each for NMR. Pt performed arm chair push ups 3 sets x10 reps each. Pt propelled w/c about 150 feet with B UEs and S.   Therapy Documentation Precautions:  Precautions Precautions: Fall, Back Restrictions Weight Bearing Restrictions: No General:   Pain: Pt c/o 3/10 back pain, medicated by nursing.   See Function Navigator for Current Functional Status.   Therapy/Group: Individual Therapy  Dub Amis 02/22/2015, 12:50 PM

## 2015-02-23 ENCOUNTER — Inpatient Hospital Stay (HOSPITAL_COMMUNITY): Payer: Medicare Other | Admitting: Occupational Therapy

## 2015-02-23 ENCOUNTER — Inpatient Hospital Stay (HOSPITAL_COMMUNITY): Payer: Medicare Other

## 2015-02-23 ENCOUNTER — Ambulatory Visit
Admission: RE | Admit: 2015-02-23 | Discharge: 2015-02-23 | Disposition: A | Discharge status: Home / Self Care | Payer: Medicare Other | Source: Ambulatory Visit | Attending: Radiation Oncology | Admitting: Radiation Oncology

## 2015-02-23 ENCOUNTER — Ambulatory Visit: Payer: Medicare Other | Attending: Radiation Oncology | Admitting: Radiation Oncology

## 2015-02-23 LAB — URINE CULTURE: Culture: >=100000

## 2015-02-23 NOTE — Progress Notes (Signed)
Social Work  Social Work Assessment and Plan  Patient Details  Name: Heather [name] MRN: [id number] Date of Birth: Mar 18, 1946  Today'Heather Date: 02/20/2015  Problem List:  Patient Active Problem List   Diagnosis Date Noted  . Paraplegia at T4 level ([place]) 02/20/2015  . Neurogenic bowel 02/20/2015  . Neurogenic bladder 02/20/2015  . Postoperative anemia due to acute blood loss 02/17/2015  . Spinal cord compression due to malignant neoplasm metastatic to spine ([place]) 02/15/2015  . Epidural mass 02/15/2015  . Thoracic spine tumor   . Pathologic fracture of vertebra 02/14/2015  . Pathologic fracture of thoracic vertebrae 02/14/2015  . Breast cancer metastasized to bone ([place]) 02/14/2015  . Hyperlipidemia 02/14/2015  . H. pylori infection   . HTN (hypertension) 04/17/2012  . Primary cancer of upper outer quadrant of right female breast ([place]) 03/08/2012   Past Medical History:  Past Medical History  Diagnosis Date  . Chronic kidney disease   . Hx: UTI (urinary tract infection)   . Right shoulder pain   . Right knee pain   . Hypertension     recently started Aldactone   . Hyperlipidemia     but not on meds;diet and exercise controlled  . Dizziness     has been going on for 66month;medical MD aware  . H/O hiatal hernia   . GERD (gastroesophageal reflux disease)     Tums prn  . Hemorrhoids   . Urinary frequency     d/t taking Aldactone  . History of UTI   . Anemia     hx of  . Cataract     immature-not sure of which eye  . Anxiety     r/t updated surgery  . Insomnia     takes Melatoniin daily  . Complication of anesthesia     pt states she is sensitive to meds  . [place]     right  . Skin cancer   . Hx of radiation therapy 10/25/12- 12/11/12    right chest wall/regional lymph nodes 5040 cGy, 28 sessions, right chest wall boost 1000 cGy 1 session  . H. pylori infection    Past Surgical History:  Past Surgical History  Procedure Laterality Date  . Cyst removed  from left breast  1970  . Left wrist surgery   2004    with plate  . Colonoscopy    . Esophagogastroduodenoscopy    . Mastectomy, radical    . Mastectomy modified radical  05/10/2012    Procedure: MASTECTOMY MODIFIED RADICAL;  Surgeon: PMerrie Roof MD;  [facility]  Service: General;  Laterality: Right;  RIGHT MODIFIED RADICAL MASTECTOMY  . Portacath placement  05/10/2012    Procedure: INSERTION PORT-A-CATH;  Surgeon: PMerrie Roof MD;  Location: [facility]  Service: General;  Laterality: Left;  . Decompressive lumbar laminectomy level 4 N/A 02/15/2015    Procedure: DECOMPRESSION T5 AND T3-T7 STABALIZATION;  Surgeon: NConsuella Lose MD;  [facility];  Service: Neurosurgery;  Laterality: N/A;   Social History:  reports that she has never smoked. She has never used smokeless tobacco. She reports that she does not drink alcohol or use illicit drugs.  Family / Support Systems Marital Status: Married How Long?: 466yrs Patient Roles: Spouse, Parent Spouse/Significant Other: spouse, JLakely [name]@ [phone]: son, JKevan [name]([facility];  son, JLen [name](and wife SAmado [name]- in [place] @ (C) [phone] Caregiver: Husband, Heather [name] retired Ability/Limitations of Caregiver: no limitations Caregiver  Availability: 24/7 Family Dynamics: Husband present during interview and very supportive.  Pt reports no concerns about amount of assist/ support available.  Notes their sons will provide as much support as they are able.  Social History Preferred language: English Religion: Baptist Cultural Background: NA Education: college Read: Yes Write: Yes Employment Status: Retired Date Retired/Disabled/Unemployed: 2012 Freight forwarder Issues: None Guardian/Conservator: none - per MD, pt is capable of making decisions on her own behalf   Abuse/Neglect Physical Abuse: Denies Verbal Abuse: Denies Sexual Abuse: Denies Exploitation of patient/patient'Heather  resources: Denies Self-Neglect: Denies  Emotional Status Pt'Heather affect, behavior adn adjustment status: Pt very pleasant and open with discussion about her metastatic cancer.  She describes herself as "realistic" about her prognosis (which she feels is fair).  She denies any Heather/Heather of significant emotional distress although notes she "has her moments."  She responds to encouragement and affect appears calm and focused on therapy.  Have deferred any formal depression screen at this point, however, will consider having neuropsychology consult next week. Recent Psychosocial Issues: Breast cancer 2014 but had regained very independent function until a few weeks ago. Pyschiatric History: None Substance Abuse History: None  Patient / Family Perceptions, Expectations & Goals Pt/Family understanding of illness & functional limitations: pt and husband appear to have a very good understanding of her diagnosis, prognosis and current functional limitations / need for CIR. Premorbid pt/family roles/activities: Independent overall until a couple of weeks ago. Anticipated changes in roles/activities/participation: Husband to assume primary caregiver role and aware goals set for min assist/ need for physical support. Pt/family expectations/goals: "I just want to get as good as I can here."  [employer]: None Premorbid Home Care/DME Agencies: None (Has done OP tx) Transportation available at discharge: yes Resource referrals recommended: Neuropsychology, Support group (specify)  Discharge Planning Living Arrangements: Spouse/significant other Support Systems: Spouse/significant other, Children, Friends/neighbors Type of Residence: Private residence Insurance underwriter Resources: [employer], Multimedia programmer (specify) Nurse, mental health) Financial Resources: [place] Referred: No Living Expenses: Own Money Management: Spouse Does the patient have any problems obtaining your  medications?: No Home Management: shared pt/ spouse Patient/Family Preliminary Plans: Pt to return home with husband as primary caregiver Social Work Anticipated Follow Up Needs: HH/OP Expected length of stay: 15-21 days  Clinical Impression Very pleasant woman here following surgery for metastatic breast ca to spine.  Presents with a very good understanding of her prognosis and a realistic picture of likely support needs at home.  Husband very capable of providing 24/7 assistance.  Pt denies any significant emotional distress, however, notes having her "moments."  She hopes to stay realistic and to help her family deal with the future.  Will follow for support and d/c planning needs.  Heather [name] 02/20/2015, 11:45 AM

## 2015-02-23 NOTE — Progress Notes (Signed)
Pt complained of burning to discomfort to left hand IV site. IV team attempted to restart IV with two different attempts and was unsuccessful. Kirstiens, MD notified and new orders of Decadron '4mg'$  PO four times a day was placed.

## 2015-02-23 NOTE — Care Management Note (Signed)
Inpatient [facility] Individual Statement of Services  Patient Name:  Heather [name]  Date:  02/23/2015  Welcome to the [facility].  Our goal is to provide you with an individualized program based on your diagnosis and situation, designed to meet your specific needs.  With this comprehensive rehabilitation program, you will be expected to participate in at least 3 hours of rehabilitation therapies Monday-Friday, with modified therapy programming on the weekends.  Your rehabilitation program will include the following services:  Physical Therapy (PT), Occupational Therapy (OT), 24 hour per day rehabilitation nursing, Therapeutic Recreaction (TR), Neuropsychology, Case Management (Social Worker), Rehabilitation Medicine, Nutrition Services and Pharmacy Services  Weekly team conferences will be held on Tuesdays to discuss your progress.  Your Social Worker will talk with you frequently to get your input and to update you on team discussions.  Team conferences with you and your family in attendance may also be held.  Expected length of stay: 15-21 days  Overall anticipated outcome: minimal assistance  Depending on your progress and recovery, your program may change. Your Social Worker will coordinate services and will keep you informed of any changes. Your Social Worker's name and contact numbers are listed  below.  The following services may also be recommended but are not provided by the [facility] will be made to provide these services after discharge if needed.  Arrangements include referral to agencies that provide these services.  Your insurance has been verified to be:  Medicare and [place] Your primary doctor is:  Dr. Dema Severin  Pertinent information will be shared with your doctor and your insurance company.  Social Worker:   [place], [place] or ([phone]   Information discussed with and copy given to patient by: Lennart Pall, 02/23/2015, 11:51 AM

## 2015-02-23 NOTE — Progress Notes (Signed)
Physical Therapy Session Note  Patient Details  Name: Heather [name] MRN: [id number] Date of Birth: May 20, 1945  Today's Date: 02/23/2015 PT Individual Time: 1000-1100 PT Individual Time Calculation (min): 60 min   Short Term Goals: Week 1:  PT Short Term Goal 1 (Week 1): Pt will be able to perform basic transfers at min assist level PT Short Term Goal 2 (Week 1): Pt will be able to gait x 30' with mod assist of 1 PT Short Term Goal 3 (Week 1): Pt will be able to demonstrate dynamic standing balance during functional task with 1 UE support with mod assist  Skilled Therapeutic Interventions/Progress Updates:    Session focused on addressing neuro re-ed for BLE motor control, postural and trunk control in sitting and standing, sit <-> stands with focus on technique and eccentric control, coordination of BLE, pelvic dissociation and addressing posture in seated and standing position. Transfers during session from min to mod assist with cues for technique and weightshifting/head-hips relationship and approximation through BLE for increased weightbearing. Used 2-3# ankle weights on BLE during neuro re-ed for increased proprioceptive input to BLE and mirror for visual feedback. In standing, pt with tendency for anterior lean and decreased ability to correct foot placement due to weakness, decreased coordination, and decreased strength with up to mod assist to facilitate. Gait trials for neuro re-ed x 2 trial with max A +2 and visual pink tape on floor to assist with foot placement and coordination x 8' each trial. Education and discussion provided on PT POC and goals per pt question in regards to planning to for home and recommendations for when her husband should be in therapy sessions. Pt continues to maintain high anxiety during therapy with encouragement and rest breaks helpful for patient.  Therapy Documentation Precautions:  Precautions Precautions: Fall, Back Restrictions Weight Bearing  Restrictions: No  Pain:  Premedicated for pain in back. C/o increased numbness in feet today.   See Function Navigator for Current Functional Status.   Therapy/Group: Individual Therapy and Co-Treatment with TR last half of session  [name], PT, DPT  02/23/2015, 12:15 PM

## 2015-02-23 NOTE — Progress Notes (Signed)
Occupational Therapy Session Note  Patient Details  Name: Heather [name] MRN: [id number] Date of Birth: [birth year]  Today's Date: 02/23/2015 OT Individual Time:  -        [id number]-[id number]  (60 min)   Short Term Goals: Week 1:  OT Short Term Goal 1 (Week 1): Pt will transfer to [facility] with min A OT Short Term Goal 2 (Week 1): Pt will complete LB dressing task with steadying assist OT Short Term Goal 3 (Week 1): Pt will maintain dynamic standing balance with steadying assist in prep for LB dressing task OT Short Term Goal 4 (Week 1): Pt will display carry over of education in functional tasks with min questioning cues Week 2:     Skilled Therapeutic Interventions/Progress Updates:     Skilled OT intervention with treatment focus on the following:    Pt sitting on BSC upon OT arrival. Pt cleaned with lateral leaning on side of toilet. Used Stedy to transfer to 3n1 in shower. She bathed seated on 3-1 BSC using LH sponge to maintain spinal pre-cautions. She dressed seated in w/c at the sink, with increased assist provided for time management.. Pt. Donned pants with mod assit getting over feet  While in wc and assist to  pull pants up.  Needed seated rest break in order to complete task.   Left pt at sink to complete grooming with needs within reach.   Therapy documentation Precautions:  Precautions Precautions: Fall, Back Restrictions Weight Bearing Restrictions: No    Vital Signs: Therapy Vitals Temp: 98.1 F (36.7 C) Temp Source: Oral Pulse Rate: 71 Resp: 18 BP: (!) 124/53 mmHg Patient Position (if appropriate): Lying Oxygen Therapy SpO2: 99 % O2 Device: Not Delivered Pain:  3/10 back             See Function Navigator for Current Functional Status.   Therapy/Group: Individual Therapy  Lisa Roca 02/23/2015, 7:47 AM

## 2015-02-23 NOTE — Progress Notes (Signed)
Recreational Therapy Assessment and Plan  Patient Details  Name: Heather [name] MRN: [id number] Date of Birth: [birth year] Today's Date: 02/23/2015  Rehab Potential: Good ELOS: 2.5 weeks   Assessment Clinical Impression:  Problem List:  Patient Active Problem List   Diagnosis Date Noted  . Thoracic myelopathy 02/18/2015  . Postoperative anemia due to acute blood loss 02/17/2015  . Spinal cord compression due to malignant neoplasm metastatic to spine ([place]) 02/15/2015  . Epidural mass 02/15/2015  . Thoracic spine tumor   . Pathologic fracture of vertebra 02/14/2015  . Pathologic fracture of thoracic vertebrae 02/14/2015  . Breast cancer metastasized to bone ([place]) 02/14/2015  . Hyperlipidemia 02/14/2015  . H. pylori infection   . HTN (hypertension) 04/17/2012  . Primary cancer of upper outer quadrant of right female breast ([place]) 03/08/2012    Past Medical History:  Past Medical History  Diagnosis Date  . Chronic kidney disease   . Hx: UTI (urinary tract infection)   . Right shoulder pain   . Right knee pain   . Hypertension     recently started Aldactone   . Hyperlipidemia     but not on meds;diet and exercise controlled  . Dizziness     has been going on for 12month;medical MD aware  . H/O hiatal hernia   . GERD (gastroesophageal reflux disease)     Tums prn  . Hemorrhoids   . Urinary frequency     d/t taking Aldactone  . History of UTI   . Anemia     hx of  . Cataract     immature-not sure of which eye  . Anxiety     r/t updated surgery  . Insomnia     takes Melatoniin daily  . Complication of anesthesia     pt states she is sensitive to meds  . [place]     [place]  . Skin cancer   . Hx of radiation therapy 10/25/12- 12/11/12    right chest wall/regional lymph nodes 5040 cGy, 28 sessions, right chest wall boost  1000 cGy 1 session  . H. pylori infection    Past Surgical History:  Past Surgical History  Procedure Laterality Date  . Cyst removed from left breast  1970  . Left wrist surgery   2004    with plate  . Colonoscopy    . Esophagogastroduodenoscopy    . Mastectomy, radical    . Mastectomy modified radical  05/10/2012    Procedure: MASTECTOMY MODIFIED RADICAL; Surgeon: PMerrie Roof MD; [facility] Service: General; Laterality: Right; RIGHT MODIFIED RADICAL MASTECTOMY  . Portacath placement  05/10/2012    Procedure: INSERTION PORT-A-CATH; Surgeon: PMerrie Roof MD; [facility] Service: General; Laterality: Left;  . Decompressive lumbar laminectomy level 4 N/A 02/15/2015    Procedure: DECOMPRESSION T5 AND T3-T7 STABALIZATION; Surgeon: NConsuella Lose MD; [facility]; Service: Neurosurgery; Laterality: N/A;    Assessment & Plan Clinical Impression: Patient is a 69y.o. year old right handed female with history of HTN, CKD, recent gastritis, R-breast cancer s/p mastectomy, with chemo/XRT 2013 with neuropathy and adjuvant therapy with letrozole. She was admitted on 02/14/2015 with reports of numbness from chest down with pain since July and one week history of BLE weakness, balance problems progressing to inability to walk, difficulty voiding and constipation. MRI brain without evidence of metastatic disease. MRI C-T-L-spine with large destructive T5 lesion with severe pathologic compression and extensive epidural tumor as well as  indeterminate scattered foci of enhancement in thoracic and lumbar spine. She was evaluated by Dr. Kathyrn Sheriff who recommended decompressive surgery. She was started on decadron and underwent T5 laminectomy with decompression and T3-T5 screw stabilization on 02/16/15. [name] consulted for input and recommended palliative radiation to spine as well as PET CT for re-staging after  discharge. Pathology positive for metastatic cancer due to breast primary. Hospital course complicated by pain management, hyponatremia, transaminitis, ABLA . Subcutaneous Lovenox for DVT prophylaxis. Acute blood loss anemia 9.5 and monitored. Post op with ABLA and has had improvement in BLE strength. PT/OT ongoing and CIR recommended for follow up therapy. Pt lives in a 1 story home with her husband, who is able to provide superversion, but only partial physical support. He has had therapy before when she was not able to use her hand and had great results, so she hoping to gain the same benefit with IPR. Patient transferred to CIR on 02/18/2015.      Pt presents with decreased activity tolerance, muscle paralysis, decreased functional mobility, decreased balance, decreased coordination Limiting pt's independence with leisure/community pursuits.  Leisure History/Participation Premorbid leisure interest/current participation: [employer];Community - Travel (Comment);Petra [place] - Flower gardening;Crafts - Knitting/Crocheting Other Leisure Interests: Television;Reading;Cooking/Baking Leisure Participation Style: Alone;With Family/Friends Awareness of Community Resources: Good-identify 3 post discharge leisure resources Psychosocial / Spiritual Does patient have pets?: No Social interaction - Mood/Behavior: Cooperative Recreational Therapy Orientation Orientation -Reviewed with patient: Available activity resources Strengths/Weaknesses Patient Strengths/Abilities: Willingness to participate;Active premorbidly Patient weaknesses: Physical limitations TR Patient demonstrates impairments in the following area(s): Endurance;Motor;Pain;Safety;Skin Integrity  Plan Rec Therapy Plan Is patient appropriate for Therapeutic Recreation?: Yes Rehab Potential: Good Treatment times per week: Min 1 time per week >20 minutes Estimated Length of Stay: 2.5 weeks TR  Treatment/Interventions: Adaptive equipment instruction;1:1 session;Balance/vestibular training;Functional mobility training;Community reintegration;Patient/family education;Therapeutic activities;Recreation/leisure participation;Therapeutic exercise;UE/LE Coordination activities;Wheelchair propulsion/positioning  Recommendations for other services: None  Discharge Criteria: Patient will be discharged from TR if patient refuses treatment 3 consecutive times without medical reason.  If treatment goals not met, if there is a change in medical status, if patient makes no progress towards goals or if patient is discharged from hospital.  The above assessment, treatment plan, treatment alternatives and goals were discussed and mutually agreed upon: by patient  [place] 02/23/2015, 3:15 PM

## 2015-02-23 NOTE — Progress Notes (Signed)
[facility]IC[facility]ILITATION     PROGRESS NOTE    Subjective/Complaints: Had a good day yesterday. Did "a lot".  Pain better on chest wall. Complains that her feet are swollen.  ROS: mild anxiety. Pt denies pains, she has tingling sensation both feet, feels numb for Chest down   Objective: Vital Signs: Blood pressure 124/53, pulse 71, temperature 98.1 F (36.7 C), temperature source Oral, resp. rate 18, height '5\' 4"'$  (1.626 m), weight 59.194 kg (130 lb 8 oz), SpO2 99 %. No results found. No results for input(s): WBC, HGB, HCT, PLT in the last 72 hours. No results for input(s): NA, K, CL, GLUCOSE, BUN, CREATININE, CALCIUM in the last 72 hours.  Invalid input(s): CO CBG (last 3)  No results for input(s): GLUCAP in the last 72 hours.  Wt Readings from Last 3 Encounters:  02/18/15 59.194 kg (130 lb 8 oz)  02/14/15 58.968 kg (130 lb)  02/14/15 58.968 kg (130 lb)    Physical Exam:  Constitutional: She is oriented to person, place, and time. She appears well-developed and well-nourished. No distress.  HENT: dentition intact Head: Normocephalic and atraumatic.  Mouth/Throat: Oropharynx is clear and moist.  Eyes: Conjunctivae and EOM are normal. Pupils are equal, round, and reactive to light.  Neck: Normal range of motion. Neck supple.  Cardiovascular: Normal rate and regular rhythm.  No murmur heard. Respiratory: Effort normal and breath sounds normal.  GI: Soft. Bowel sounds are normal. She exhibits no distension. There is no tenderness.  Musculoskeletal: She exhibits no tenderness. Trace edema in feet    Neurological: She is alert and oriented to person, place, and time. She displays 3+ dtr's at knees/ankles. Coordination (BLE ataxic) abnormal. UE grossly 4 to 4+/5 with inhibition proximally due to pain. LE: 3-/5 hf,ke and 3 to 3+ adf/apf. 2- toe flex and ext Sensation remains decreased below T5-6, does sense gross touch identifies which toes are touched as  well Skin: Skin is warm and dry. No rash noted. No erythema.   Psych: mild anxiety  Assessment/Plan: 1. Functional deficits secondary to T5 Cord compression/myelopathy due compression fx, metastatic breast cancer which require 3+ hours per day of interdisciplinary therapy in a comprehensive inpatient rehab setting. Physiatrist is providing close team supervision and 24 hour management of active medical problems listed below. Physiatrist and rehab team continue to assess barriers to discharge/monitor patient progress toward functional and medical goals.  Function:  Bathing Bathing position   Position: Shower  Bathing parts Body parts bathed by patient: Right arm, Left arm, Chest, Abdomen, Right upper leg, Left upper leg, Front perineal area, Right lower leg, Left lower leg Body parts bathed by helper: Buttocks, Back  Bathing assist Assist Level: Touching or steadying assistance(Pt > 75%)      Upper Body Dressing/Undressing Upper body dressing   What is the patient wearing?: Pull over shirt/dress     Pull over shirt/dress - Perfomed by patient: Thread/unthread right sleeve, Thread/unthread left sleeve, Put head through opening, Pull shirt over trunk          Upper body assist Assist Level: Set up   Set up : To obtain clothing/put away  Lower Body Dressing/Undressing Lower body dressing   What is the patient wearing?: Non-skid slipper socks, Pants     Pants- Performed by patient: Pull pants up/down Pants- Performed by helper: Thread/unthread right pants leg, Thread/unthread left pants leg Non-skid slipper socks- Performed by patient: Don/doff left sock Non-skid slipper socks- Performed by helper: Don/doff  right sock Socks - Performed by patient: Don/doff right sock, Don/doff left sock Socks - Performed by helper: Don/doff right sock, Don/doff left sock Shoes - Performed by patient: Fasten right, Fasten left Shoes - Performed by helper: Don/doff right shoe, Don/doff left  shoe          Lower body assist        Toileting Toileting   Toileting steps completed by patient: Performs perineal hygiene Toileting steps completed by helper: Adjust clothing prior to toileting, Adjust clothing after toileting Toileting Assistive Devices:  (in [place])  [place] level: Touching or steadying assistance (Pt.75%)   Transfers Chair/bed transfer   Chair/bed transfer method: Stand pivot Chair/bed transfer assist level: Moderate assist (Pt 50 - 74%/lift or lower) Chair/bed transfer assistive device: Environmental manager lift: Ecologist     Max distance: 15 Assist level: 2 helpers   Wheelchair   Type: Manual Max wheelchair distance: 150 Assist Level: Supervision or verbal cues  Cognition Comprehension Comprehension assist level: Follows complex conversation/direction with no assist  Expression Expression assist level: Expresses complex ideas: With no assist  Social Interaction Social Interaction assist level: Interacts appropriately with others with medication or extra time (anti-anxiety, antidepressant).  Problem Solving Problem solving assist level: Solves complex problems: With extra time  Memory Memory assist level: Complete Independence: No helper   Medical Problem List and Plan: 1. Functional deficits secondary to T5 cord compression/myelopathy from pathologic compression fracture, metastatic breast cancer  -explained to there that while there is some mild swelling in her feet, her sensory loss probably accounts more for the "perception" of them feeling swollen---can elevate feet, use TEDS, etc 2. DVT Prophylaxis/Anticoagulation: Pharmaceutical: Lovenox indicated due to immobility/hypercoaguable state    3. Pain Management: . Continue oxycodone prn.   -increased gabapentin to '200mg'$  tid with good results for T5 radiculopathy causing band like pain at left breast/chest wall 4. Adjustment reaction with anxiety/ Mood:    -continue low dose Xanax to help with anxiety attacks.   -Team to provide ego support. Provided patient positive information regarding potential today  -LCSW to follow for evaluation and support.  5. Neuropsych: This patient is capable of making decisions on her own behalf. 6. Skin/Wound Care: Routine pressure relief measures. Maintain adequate nutrition and hydration status.     7. Fluids/Electrolytes/Nutrition: Monitor I/O. Encourage po  8. ABLA: post op  -pt asked not to be on Fe++ supp  -follow up blood work this week  9. HTN: Monitor BID. Continue aldactone daily and titrate as needed.  10. Mild chronic gastritis: Continue Pepcid bid.   -bears watching while on decadron, some of chest wall, hypogastric sx may be related to this.  11. Neurogenic bowel and bladder:had large bm on friday.  -voiding fairly well  LOS (Days) 5 A FACE TO FACE EVALUATION WAS PERFORMED  SWARTZ,ZACHARY T 02/23/2015 9:05 AM

## 2015-02-23 NOTE — Progress Notes (Signed)
Occupational Therapy Session Note  Patient Details  Name: Heather [name] MRN: [id number] Date of Birth: 09/20/1945  Today's Date: 02/23/2015 OT Individual Time: 1100-1200 OT Individual Time Calculation (min): 60 min    Short Term Goals: Week 1:  OT Short Term Goal 1 (Week 1): Pt will transfer to [facility] with min A OT Short Term Goal 2 (Week 1): Pt will complete LB dressing task with steadying assist OT Short Term Goal 3 (Week 1): Pt will maintain dynamic standing balance with steadying assist in prep for LB dressing task OT Short Term Goal 4 (Week 1): Pt will display carry over of education in functional tasks with min questioning cues  Skilled Therapeutic Interventions/Progress Updates:    Pt had just completed an intense PT session and was feeling fatigued. Therapy activities structured around pt's fatigue. Pt received in w/c, transferred to mat with squat pivot with steadying Assist lifting hips well, cues to scoot with head/hip technique on mat to avoid "dragging" hip. Pt moved into supine with knees supported in flexion. Pt worked on spinal stabilization exercises with deep breathing to stimulate core muscles.  Pelvic tilts from neutral spine to slight post tilt 20x followed by small bridges to activate glutes. Upper chest stretches.  Pt transferred back to w/c close S with squat pivot.  Worked with light theraband on rotator cuff exercises with min cues for technique. Provided with band to use in room. Pt returned to room with spouse.  Therapy Documentation Precautions:  Precautions Precautions: Fall, Back Restrictions Weight Bearing Restrictions: No     Pain: Pain Assessment Pain Assessment: No/denies pain ADL:  See Function Navigator for Current Functional Status.   Therapy/Group: Individual Therapy  [place] 02/23/2015, 12:58 PM

## 2015-02-23 NOTE — Progress Notes (Signed)
Physical Therapy Session Note  Patient Details  Name: Heather [name] MRN: [id number] Date of Birth: 04-21-1946  Today's Date: 02/23/2015 PT Individual Time: 1430-1530 PT Individual Time Calculation (min): 60 min   Short Term Goals: Week 1:  PT Short Term Goal 1 (Week 1): Pt will be able to perform basic transfers at min assist level PT Short Term Goal 2 (Week 1): Pt will be able to gait x 30' with mod assist of 1 PT Short Term Goal 3 (Week 1): Pt will be able to demonstrate dynamic standing balance during functional task with 1 UE support with mod assist  Skilled Therapeutic Interventions/Progress Updates:   Session focused on neuro re-ed, overall endurance and activity tolerance, functional transfers, and w/c mobility outdoors over uneven surfaces for community mobility. Neuro re-ed to address dynamic sitting balance, postural control, pelvic dissociation, and weightbearing through BLE during mobility activities including reaching outside BOS for items and tossing them to matching color, weightshifting with wedge under L hip for pelvic alignment and feedback for patient during reaching activities, and sit to partial squats during anterior weightshift with both UE and then 1 UE for carryover during transfers.   Therapy Documentation Precautions:  Precautions Precautions: Fall, Back Restrictions Weight Bearing Restrictions: No  Pain: Reports pain is manageable today. Repositioning and rest breaks on back during mobility activities.  See Function Navigator for Current Functional Status.   Therapy/Group: Individual Therapy  [name], PT, DPT  02/23/2015, 4:08 PM

## 2015-02-24 ENCOUNTER — Inpatient Hospital Stay (HOSPITAL_COMMUNITY): Payer: Medicare Other | Admitting: Occupational Therapy

## 2015-02-24 ENCOUNTER — Inpatient Hospital Stay (HOSPITAL_COMMUNITY): Payer: Medicare Other

## 2015-02-24 DIAGNOSIS — N390 Urinary tract infection, site not specified: Secondary | ICD-10-CM

## 2015-02-24 DIAGNOSIS — R52 Pain, unspecified: Secondary | ICD-10-CM | POA: Diagnosis present

## 2015-02-24 LAB — CBC
HCT: 33.9 % — ABNORMAL LOW (ref 36.0–46.0)
Hemoglobin: 11.2 g/dL — ABNORMAL LOW (ref 12.0–15.0)
MCH: 28.8 pg (ref 26.0–34.0)
MCHC: 33 g/dL (ref 30.0–36.0)
MCV: 87.1 fL (ref 78.0–100.0)
Platelets: 300 10*3/uL (ref 150–400)
RBC: 3.89 MIL/uL (ref 3.87–5.11)
RDW: 13.6 % (ref 11.5–15.5)
WBC: 13 10*3/uL — ABNORMAL HIGH (ref 4.0–10.5)

## 2015-02-24 LAB — BASIC METABOLIC PANEL
Anion gap: 12 (ref 5–15)
BUN: 32 mg/dL — ABNORMAL HIGH (ref 6–20)
CO2: 28 mmol/L (ref 22–32)
Calcium: 9 mg/dL (ref 8.9–10.3)
Chloride: 93 mmol/L — ABNORMAL LOW (ref 101–111)
Creatinine, Ser: 0.77 mg/dL (ref 0.44–1.00)
GFR calc Af Amer: >60 mL/min (ref >60)
GFR calc non Af Amer: >60 mL/min (ref >60)
Glucose, Bld: 166 mg/dL — ABNORMAL HIGH (ref 65–99)
Potassium: 4.4 mmol/L (ref 3.5–5.1)
Sodium: 133 mmol/L — ABNORMAL LOW (ref 135–145)

## 2015-02-24 MED ORDER — SULFAMETHOXAZOLE-TRIMETHOPRIM 800-160 MG PO TABS
1.0000 | ORAL_TABLET | Freq: Two times a day (BID) | ORAL | Status: AC
  Administered 2015-02-24 – 2015-02-28 (×10): 1 via ORAL
  Filled 2015-02-24 (×10): qty 1

## 2015-02-24 NOTE — Progress Notes (Signed)
Occupational Therapy Session Note  Patient Details  Name: Heather [name] MRN: [id number] Date of Birth: 10/10/1945  Today's Date: 02/24/2015 OT Individual Time: 1100-1200 OT Individual Time Calculation (min): 60 min    Short Term Goals: Week 1:  OT Short Term Goal 1 (Week 1): Pt will transfer to [facility] with min A OT Short Term Goal 2 (Week 1): Pt will complete LB dressing task with steadying assist OT Short Term Goal 3 (Week 1): Pt will maintain dynamic standing balance with steadying assist in prep for LB dressing task OT Short Term Goal 4 (Week 1): Pt will display carry over of education in functional tasks with min questioning cues      Skilled Therapeutic Interventions/Progress Updates:    Pt seen this session to facilitate trunk control/ strength with various sitting balance challenges. Pt transferred to elevated therapy mat so her thighs at 45 degree incline. Pt worked on lifting sternum to increase dynamic trunk extension, contracting abs and then engaging in: lateral leans with and without UE support, alternating reaches overhead, and slight forward lean with touching A for support. Occasional rest breaks between each set.  Sit to partial stand 10 x for 4 sets to engage quads with emphasis on spinal alignment, support through LLE to keep foot in place. Mat lowered to neutral to allow pt to continue working on balance exercises. Pt then rested prior to transfer back to chair. Returned to room and pt donned her prosthetic bra.  Pt in room with all needs met.  Therapy Documentation Precautions:  Precautions Precautions: Fall, Back Restrictions Weight Bearing Restrictions: No    Vital Signs: Therapy Vitals Temp: 97.7 F (36.5 C) Temp Source: Oral Pulse Rate: 61 Resp: 18 BP: (!) 111/53 mmHg Patient Position (if appropriate): Lying Oxygen Therapy SpO2: 99 % O2 Device: Not Delivered Pain: Pain Assessment Pain Assessment: No/denies pain ADL:  See Function Navigator for  Current Functional Status.   Therapy/Group: Individual Therapy  SAGUIER,JULIA 02/24/2015, 8:30 AM  

## 2015-02-24 NOTE — Progress Notes (Signed)
Physical Therapy Session Note  Patient Details  Name: Heather [name] MRN: [id number] Date of Birth: 02/18/1946  Today's Date: 02/24/2015 PT Individual Time: 0930-1030 PT Individual Time Calculation (min): 60 min   Short Term Goals: Week 1:  PT Short Term Goal 1 (Week 1): Pt will be able to perform basic transfers at min assist level PT Short Term Goal 2 (Week 1): Pt will be able to gait x 30' with mod assist of 1 PT Short Term Goal 3 (Week 1): Pt will be able to demonstrate dynamic standing balance during functional task with 1 UE support with mod assist  Skilled Therapeutic Interventions/Progress Updates:   Session focused on addressing adjusting w/c to add elevating legrests for edema control and education on positioning and performing LE exercises to aid with edema control, neuro re-ed to BLE on Nustep for reciprocal movement pattern re-training, proprioceptive input to BLE with closed chain activity, and focus on pt maintaining knees in neutral (used RUE for support - did not use RUE on nustep due to precautions/overuse of masectomy side), transfers including squat pivot and sit to stands with mod assist and cues for technique,and  neuro re-ed for sit to stands and gait training with RW and tape on floor for visual feedback as well as mirror x 8' and x 12' with mod to max A +2 with focus on balance, postural control, foot placement, coordination, and positioning of RW. Pt making good progress though still limited by anxiety, fatigue, and sensory/proprioceptive impairments.   Therapy Documentation Precautions:  Precautions Precautions: Fall, Back Restrictions Weight Bearing Restrictions: No  Pain: Reports back pain - RN notified for pain medication.   See Function Navigator for Current Functional Status.   Therapy/Group: Individual Therapy and Co-Treatment with TR for last 30 min  [name], PT, DPT  02/24/2015, 10:56 AM

## 2015-02-24 NOTE — Patient Care Conference (Signed)
Inpatient RehabilitationTeam Conference and Plan of Care Update Date: 02/24/2015   Time: 10:50 am    Patient Name: Heather [name]      Medical Record Number: [id number]  Date of Birth: 06/26/1945 Sex: Female         Room/Bed: 4M02C/4M02C-01 Payor Info: Payor: MEDICARE / Plan: MEDICARE PART A AND B / Product Type: *No Product type* /    Admitting Diagnosis: decompression , myelopathy   Admit Date/Time:  02/18/2015  5:58 PM Admission Comments: No comment available   Primary Diagnosis:  Paraplegia at T4 level [facility]) Principal Problem: Paraplegia at T4 level [facility])  Patient Active Problem List   Diagnosis Date Noted  . Pain   . Urinary tract infection, site not specified   . Paraplegia at T4 level ([place]) 02/20/2015  . Neurogenic bowel 02/20/2015  . Neurogenic bladder 02/20/2015  . Postoperative anemia due to acute blood loss 02/17/2015  . Spinal cord compression due to malignant neoplasm metastatic to spine ([place]) 02/15/2015  . Epidural mass 02/15/2015  . Thoracic spine tumor   . Pathologic fracture of vertebra 02/14/2015  . Pathologic fracture of thoracic vertebrae 02/14/2015  . Breast cancer metastasized to bone ([place]) 02/14/2015  . Hyperlipidemia 02/14/2015  . H. pylori infection   . HTN (hypertension) 04/17/2012  . Primary cancer of upper outer quadrant of right female breast ([place]) 03/08/2012    Expected Discharge Date: Expected Discharge Date: 03/12/15  Team Members Present: Physician leading conference: Dr. Alger Simons Social Worker Present: Lennart Pall, LCSW PT Present: Canary Brim, PT OT Present: Napoleon Form, OT SLP Present: Windell Moulding, SLP     Current Status/Progress Goal Weekly Team Focus  Medical   T4 Paraplegia with b/l LE weakness  Improve strength, particularly b/l LE, tx of pain and UTI  Improve strength, address pain   Bowel/Bladder   Continent of bowel and bladder. LBM 02/23/15  Pt to remain continent of bowel and bladder  Monitor    Swallow/Nutrition/  Hydration             ADL's   Min- mod LB dressing; min squat pivot functional transfers; mod-max toileting  Supervision- min A overall  Standing balance, functional transfers, standing endurance, neuro re-ed   Mobility   mod A transfers; +2 gait and stairs; S w/c mobility  mod I w/c level; min A short distance gait  neuro re-ed, balance, endurance, strengthening, transfers, gait   Communication             Safety/Cognition/ Behavioral Observations            Pain   Tramadol '50mg'$  q 6hrs for discomfort  <3  Monitor for effectiveness, and nonverbal cues of pain   Skin   Ecchymosis to R wrist, incisional line with dermabond, OTA, healing appropriately  No additional skin breakdown  Monitor    Rehab Goals Patient on target to meet rehab goals: Yes *See Care Plan and progress notes for long and short-term goals.  Barriers to Discharge: UTI, Pain    Possible Resolutions to Barriers:  Abx, and adjustment of medications    Discharge Planning/Teaching Needs:  home with husband who can provide 24/7 assistance      Team Discussion:  UTI - abx started.  Trying to wean some pain meds.  Continues to be anxious, however, much better with OT this morning.  Very impaired from sensory standpoint.  Goals set for mod i w/c and min assist gait.  Supervision to min assist with ADLs.  Have encouraged husband to take more breaks from the hospital.  Pt remain very motivated.  Revisions to Treatment Plan:  None   Continued Need for Acute Rehabilitation Level of Care: The patient requires daily medical management by a physician with specialized training in physical medicine and rehabilitation for the following conditions: Daily direction of a multidisciplinary physical rehabilitation program to ensure safe treatment while eliciting the highest outcome that is of practical value to the patient.: Yes Daily medical management of patient stability for increased activity during participation in an intensive  rehabilitation regime.: Yes Daily analysis of laboratory values and/or radiology reports with any subsequent need for medication adjustment of medical intervention for : Neurological problems;Other  Heather [name] 02/25/2015, 12:50 PM

## 2015-02-24 NOTE — Progress Notes (Signed)
Social Work Patient ID: Heather [name], female   DOB: January 25, 1946, 68 y.o.   MRN: [id number]   Have reviewed team conference info with pt.  She is aware and agreeable with targeted d/c date and goals.  Will follow up with her husband tomorrow when he returns to hospital.    Lennart Pall, LCSW

## 2015-02-24 NOTE — Progress Notes (Signed)
Occupational Therapy Session Note  Patient Details  Name: Heather [name] MRN: [id number] Date of Birth: 03-Jul-1945  Today's Date: 02/24/2015 OT Individual Time: 0730-0830 OT Individual Time Calculation (min): 60 min    Short Term Goals: Week 1:  OT Short Term Goal 1 (Week 1): Pt will transfer to [facility] with min A OT Short Term Goal 2 (Week 1): Pt will complete LB dressing task with steadying assist OT Short Term Goal 3 (Week 1): Pt will maintain dynamic standing balance with steadying assist in prep for LB dressing task OT Short Term Goal 4 (Week 1): Pt will display carry over of education in functional tasks with min questioning cues  Skilled Therapeutic Interventions/Progress Updates:    Session One: Pt seen for OT ADl bathing and dressing session. RN present upon arrival. Pt voiced desire to complete showering task. She bathed seated on 3-1 BSC. Increased assist required to enter shower due to decreased proprioception and input in B LEs. Pt completed buttock hygiene standing at sink following shower due not feeling comfortable completing lateral leans and not wanting to stand in wet shower. Steadying assist provided for standing tasks, with VCs for foot placement. Pt's LE unable to maintain static position, extending out prior to completing full stand. She dressed seated in w/c at sink, assist provided to pull up pants due to fatigue.  Pt left sitting in w/c, set-up with breakfast tray and all needs in reach. Pt demonstrated increased standing balance today compared to previous sessions. She was much less anxious throughout session and on the verge of being impulisve, requiring VCs for locking w/c and overall safety awareness.   Session Two: Pt seen for OT therapy session focusing on functional mobility, transfers, and education. Pt sitting up in w/c upon arrival, agreeable to tx session. During session, pt required max cuing and demonstration for w/c parts management including elevating leg  rests throughout session. In ADL apartment, pt shown tub transfer bench and educated regarding options for bathroom DME, and safety within the bathroom including placement of grab bars, no-slip bath mats, etc. She completed squat pivot transfer with min A to standard bed where she completed bed mobility. Educated regarding bed rail option, however, pt demonstrated ability to transfer supine> EOB on standard bed without need for rails. She completed 2x sit <> stand from bed and 2x from w/c with min A using RW. Pt with hyperextension in B LEs during sit <> stand. VCs and demonstration provided for technique. VCs required for weight shift as pt with strong anterior lean.  Pt provided with elastic shoelaces for assist with LB dressing. Pt returned to room at end of session, left sitting up in w/c with all needs in reach.   Therapy Documentation Precautions:  Precautions Precautions: Fall, Back Restrictions Weight Bearing Restrictions: No Pain:   No/ denies pain  See Function Navigator for Current Functional Status.   Therapy/Group: Individual Therapy  Lewis, Emoni Yang C 02/24/2015, 7:09 AM

## 2015-02-24 NOTE — Progress Notes (Signed)
[place] PHYSICAL MEDICINE & REHABILITATION     PROGRESS NOTE    Subjective/Complaints: Pt slept well overnight.  She believes she is progressing in therapies.  She would like to know about weaning her steroids.  She also complains of muscle soreness post-exercise.  Pt was encouraged to take muscle relaxant as needed.    ROS: mild anxiety. Pt denies she has tingling sensation both feet, feels numb for Chest down.  Denies CP, SOB, n/v/d.   Objective: Vital Signs: Blood pressure 111/53, pulse 61, temperature 97.7 F (36.5 C), temperature source Oral, resp. rate 18, height '5\' 4"'$  (1.626 m), weight 59.194 kg (130 lb 8 oz), SpO2 99 %. No results found.  Recent Labs  02/24/15 0601  WBC 13.0*  HGB 11.2*  HCT 33.9*  PLT 300    Recent Labs  02/24/15 0601  NA 133*  K 4.4  CL 93*  GLUCOSE 166*  BUN 32*  CREATININE 0.77  CALCIUM 9.0   CBG (last 3)  No results for input(s): GLUCAP in the last 72 hours.  Wt Readings from Last 3 Encounters:  02/18/15 59.194 kg (130 lb 8 oz)  02/14/15 58.968 kg (130 lb)  02/14/15 58.968 kg (130 lb)    Physical Exam:  Constitutional: She appears well-developed and well-nourished. No distress.  HENT: dentition intact.  Head: Normocephalic and atraumatic.  Mouth/Throat: Oropharynx is clear and moist.  Eyes: Conjunctivae and EOM are normal. Conj WNL Neck: Normal range of motion. Neck supple.  Cardiovascular: Normal rate and regular rhythm.  No murmur heard. Respiratory: Effort normal and breath sounds normal.  GI: Soft. Bowel sounds are normal. She exhibits no distension. There is no tenderness.  Musculoskeletal: She exhibits no tenderness. Trace edema in feet Neurological: She is alert and oriented. She displays 3+ dtr's at knees/ankles. Coordination (BLE ataxic) Strength UE grossly 4 to 4+/5 with inhibition proximally due to pain. LE: 3-/5 hf,ke and 3 to 3+ adf/apf. 2- toe flex and ext Sensation remains decreased below T5-6,  Skin: Skin  is warm and dry. No rash noted. No erythema.  Psych: mild anxiety  Assessment/Plan: 1. Functional deficits secondary to T5 Cord compression/myelopathy due compression fx, metastatic breast cancer which require 3+ hours per day of interdisciplinary therapy in a comprehensive inpatient rehab setting. Physiatrist is providing close team supervision and 24 hour management of active medical problems listed below. Physiatrist and rehab team continue to assess barriers to discharge/monitor patient progress toward functional and medical goals.  Function:  Bathing Bathing position   Position: Shower  Bathing parts Body parts bathed by patient: Right arm, Left arm, Chest, Abdomen, Right upper leg, Left upper leg, Front perineal area, Right lower leg, Left lower leg Body parts bathed by helper: Buttocks, Back  Bathing assist Assist Level: Touching or steadying assistance(Pt > 75%)      Upper Body Dressing/Undressing Upper body dressing   What is the patient wearing?: Pull over shirt/dress     Pull over shirt/dress - Perfomed by patient: Thread/unthread right sleeve, Thread/unthread left sleeve, Put head through opening, Pull shirt over trunk          Upper body assist Assist Level: Set up   Set up : To obtain clothing/put away  Lower Body Dressing/Undressing Lower body dressing   What is the patient wearing?: Shoes, Socks, Pants, [employer]- Performed by patient: Thread/unthread right pants leg, Thread/unthread left pants leg Pants- Performed by helper: Pull pants up/down Non-skid slipper socks- Performed by patient:  Don/doff left sock Non-skid slipper socks- Performed by helper: Don/doff right sock, Don/doff left sock Socks - Performed by patient: Don/doff right sock, Don/doff left sock Socks - Performed by helper: Don/doff right sock, Don/doff left sock Shoes - Performed by patient: Don/doff left shoe Shoes - Performed by helper: Don/doff right shoe, Fasten right, Fasten  left       TED Hose - Performed by helper: Don/doff right TED hose, Don/doff left TED hose  Lower body assist Assist for lower body dressing: Touching or steadying assistance (Pt > 75%)      Toileting Toileting   Toileting steps completed by patient: Performs perineal hygiene Toileting steps completed by helper: Adjust clothing prior to toileting, Adjust clothing after toileting Toileting Assistive Devices:  (in [place])  [place] level: Touching or steadying assistance (Pt.75%)   Transfers Chair/bed transfer   Chair/bed transfer method: Stand pivot Chair/bed transfer assist level: Moderate assist (Pt 50 - 74%/lift or lower) Chair/bed transfer assistive device: Armrests Mechanical lift: Ecologist     Max distance: 8 Assist level: 2 helpers   Wheelchair   Type: Manual Max wheelchair distance: 150 Assist Level: Supervision or verbal cues  Cognition Comprehension Comprehension assist level: Follows complex conversation/direction with no assist  Expression Expression assist level: Expresses complex ideas: With no assist  Social Interaction Social Interaction assist level: Interacts appropriately with others - No medications needed.  Problem Solving Problem solving assist level: Solves complex problems: With extra time  Memory Memory assist level: Complete Independence: No helper   Medical Problem List and Plan: 1. Functional deficits secondary to T5 cord compression/myelopathy from pathologic compression fracture, metastatic breast cancer  -while there is some mild swelling in her feet, her sensory loss probably accounts more for the "perception" of them feeling swollen---can elevate feet, use TEDS, etc  -Will clarify length and frequency of steroids with Neurosurg 2. DVT Prophylaxis/Anticoagulation: Pharmaceutical: Lovenox indicated due to immobility/hypercoaguable state 3. Pain Management: . Continue oxycodone prn, robaxin prn.    -increased gabapentin to '200mg'$  tid with good results for T5 radiculopathy causing band like pain at left breast/chest wall 4. Adjustment reaction with anxiety/ Mood:   -continue low dose Xanax to help with anxiety attacks.   -Team to provide ego support. Provided patient positive information regarding potential today  -LCSW to follow for evaluation and support.  5. Neuropsych: This patient is capable of making decisions on her own behalf. 6. Skin/Wound Care: Routine pressure relief measures. Maintain adequate nutrition and hydration status. 7. Fluids/Electrolytes/Nutrition: Monitor I/O. Encourage po  8. ABLA: post op  -pt asked not to be on Fe++ supp  -Repeat Hb, improved, however, potentially heme concentrated, will cont to monitor  9. HTN: Monitor BID. Continue aldactone daily and titrate as needed.  10. Mild chronic gastritis: Continue Pepcid bid.   -bears watching while on decadron, some of chest wall, hypogastric sx may be related to this.  11. Neurogenic bowel and bladder:had large bm on friday.  -voiding fairly well 12. UTI: Ucx +Klebsiella on 10/18. Bactrim started 10/18-10/23.  LOS (Days) 6 A FACE TO FACE EVALUATION WAS PERFORMED  [name] 02/24/2015 8:40 AM

## 2015-02-24 NOTE — Progress Notes (Signed)
Recreational Therapy Session Note  Patient Details  Name: Heather [name] MRN: [id number] Date of Birth: 1946-05-07 Today's Date: 02/24/2015  Pain: no c/o Skilled Therapeutic Interventions/Progress Updates: Session focused on activity tolerance & ambulation using RW during co-treat with PT.  Pt ambulated with RW, mirror & tape on the floor for foot placement & visual feedback with +2 assist.  Pt required mod instructional cues for foot placement, step length, location of walker.  Pt also required cuing & reorganization of task due to anxiety.  Emotional support and encouragement provided Therapy/Group: Co-Treatment   Amela Handley 02/24/2015, 1:20 PM

## 2015-02-25 ENCOUNTER — Inpatient Hospital Stay (HOSPITAL_COMMUNITY): Payer: Medicare Other | Admitting: Physical Therapy

## 2015-02-25 ENCOUNTER — Inpatient Hospital Stay (HOSPITAL_COMMUNITY): Payer: Medicare Other

## 2015-02-25 ENCOUNTER — Other Ambulatory Visit: Payer: Self-pay | Admitting: Radiation Therapy

## 2015-02-25 ENCOUNTER — Inpatient Hospital Stay (HOSPITAL_COMMUNITY): Payer: Medicare Other | Admitting: Occupational Therapy

## 2015-02-25 ENCOUNTER — Encounter: Payer: Self-pay | Admitting: Radiation Therapy

## 2015-02-25 DIAGNOSIS — N390 Urinary tract infection, site not specified: Secondary | ICD-10-CM

## 2015-02-25 DIAGNOSIS — R52 Pain, unspecified: Secondary | ICD-10-CM

## 2015-02-25 DIAGNOSIS — N319 Neuromuscular dysfunction of bladder, unspecified: Secondary | ICD-10-CM

## 2015-02-25 DIAGNOSIS — G839 Paralytic syndrome, unspecified: Secondary | ICD-10-CM

## 2015-02-25 DIAGNOSIS — D492 Neoplasm of unspecified behavior of bone, soft tissue, and skin: Secondary | ICD-10-CM

## 2015-02-25 DIAGNOSIS — D62 Acute posthemorrhagic anemia: Secondary | ICD-10-CM

## 2015-02-25 MED ORDER — GABAPENTIN 100 MG PO CAPS
200.0000 mg | ORAL_CAPSULE | Freq: Three times a day (TID) | ORAL | Status: DC
  Administered 2015-02-25 – 2015-02-26 (×3): 200 mg via ORAL
  Filled 2015-02-25 (×3): qty 2

## 2015-02-25 MED ORDER — GABAPENTIN 400 MG PO CAPS: 400.0000 mg | ORAL_CAPSULE | Freq: Three times a day (TID) | ORAL | Status: DC

## 2015-02-25 NOTE — Progress Notes (Signed)
Physical Therapy Session Note  Patient Details  Name: Heather [name] MRN: [id number] Date of Birth: Mar 16, 1946  Today's Date: 02/25/2015 PT Individual Time: 1000-1100 PT Individual Time Calculation (min): 60 min   Short Term Goals: Week 1:  PT Short Term Goal 1 (Week 1): Pt will be able to perform basic transfers at min assist level PT Short Term Goal 2 (Week 1): Pt will be able to gait x 30' with mod assist of 1 PT Short Term Goal 3 (Week 1): Pt will be able to demonstrate dynamic standing balance during functional task with 1 UE support with mod assist  Skilled Therapeutic Interventions/Progress Updates:   Session focused on addressing edema and positioning to decrease edema and education about options at home for positioning, overall endurance and activity tolerance, and neuro re-ed to address coordination, balance, motor control, and postural control in parallel bars including sit <-> stands with mod assist, alternating toe taps with visual target to 2" step and progressed to 4" step with max A for balance multiple repetitions, and step ups on 4" step x 2 reps with max A due to knee hyperextension and knees buckling. End of session transferred into recliner with light mod assist and mod verbal cues for attention to foot placement to prevent fall during transfer. LE's elevated for edema control and positioned with pillows for comfort.   Therapy Documentation Precautions:  Precautions Precautions: Fall, Back Restrictions Weight Bearing Restrictions: No   Pain: Reports premedicated for pain. States she feels kind of lightheaded after taking all her pills. Monitored during session.   See Function Navigator for Current Functional Status.   Therapy/Group: Individual Therapy  [name], PT, DPT  02/25/2015, 12:21 PM

## 2015-02-25 NOTE — Progress Notes (Signed)
Physical Therapy Session Note  Patient Details  Name: Heather [name] MRN: [id number] Date of Birth: 23-Nov-1945  Today's Date: 02/25/2015 PT Individual Time: 1505-1605 PT Individual Time Calculation (min): 60 min   Short Term Goals: Week 1:  PT Short Term Goal 1 (Week 1): Pt will be able to perform basic transfers at min assist level PT Short Term Goal 2 (Week 1): Pt will be able to gait x 30' with mod assist of 1 PT Short Term Goal 3 (Week 1): Pt will be able to demonstrate dynamic standing balance during functional task with 1 UE support with mod assist  Skilled Therapeutic Interventions/Progress Updates:    Pt received sitting in w/c and agreeable to therapy session.  Session focused on NMR, functional transfers, strengthening, and endurance.  Pt propelled to therapy gym in w/c and transferred to Nustep with steady assist.  Pt performed 8 minutes on Nustep at level 1 for reciprocal movement, LE strengthening, coordination, and endurance.  Pt reporting some pain in L shoulder after 8 minutes, so activity terminated.  Pt transferred back to w/c steady assist and propelled to // bars.  Sit<>stand at // bars with small foam wedge for extensor activation.  Pt able to achieve standing but knees buckled requiring mod assist into tall kneeling on foam.  PT instructed patient in safe transfer to maintain back precautions from tall kneeling>floor mat>bench with overall mod assist.  Pt transitioned from bench>w/c>mat with overall steady assist.  PT instructed patient in LE D1/D2 PROM and AAROM x2 minutes for each diagonal to increase muscle co-activation and coordination.  Pt returned to w/c with squat/pivot and knees buckled again requiring mod assist to arrive safely in the w/c.  Pt returned to room in w/c at end of session, family present, and positioned with call bell in reach and needs met.    Therapy Documentation Precautions:  Precautions Precautions: Fall, Back Restrictions Weight Bearing  Restrictions: No Pain: Pain Assessment Pain Assessment: No/denies pain   See Function Navigator for Current Functional Status.   Therapy/Group: Individual Therapy  Earnest Conroy Penven-Crew 02/25/2015, 4:42 PM

## 2015-02-25 NOTE — Progress Notes (Signed)
[place] PHYSICAL MEDICINE & REHABILITATION     PROGRESS NOTE    Subjective/Complaints: Pt slept well overnight.  She complains of burning pain and sensation of swelling in her feet.  Discussed with the patient again the perception of edema vs. Actual amount of swelling.    ROS: mild anxiety. Pt denies she has tingling sensation both feet, feels numb for Chest down.  Denies CP, SOB, n/v/d.   Objective: Vital Signs: Blood pressure 112/58, pulse 60, temperature 97.7 F (36.5 C), temperature source Oral, resp. rate 18, height '5\' 4"'$  (1.626 m), weight 59.194 kg (130 lb 8 oz), SpO2 99 %. No results found.  Recent Labs  02/24/15 0601  WBC 13.0*  HGB 11.2*  HCT 33.9*  PLT 300    Recent Labs  02/24/15 0601  NA 133*  K 4.4  CL 93*  GLUCOSE 166*  BUN 32*  CREATININE 0.77  CALCIUM 9.0   CBG (last 3)  No results for input(s): GLUCAP in the last 72 hours.  Wt Readings from Last 3 Encounters:  02/18/15 59.194 kg (130 lb 8 oz)  02/14/15 58.968 kg (130 lb)  02/14/15 58.968 kg (130 lb)    Physical Exam:  Constitutional: She appears well-developed and well-nourished. No distress.  HENT: dentition intact.  Head: Normocephalic and atraumatic.  Mouth/Throat: Oropharynx is clear and moist.  Eyes: Conjunctivae and EOM are normal. Conj WNL Neck: Normal range of motion. Neck supple.  Cardiovascular: Normal rate and regular rhythm.  No murmur heard. Respiratory: Effort normal and breath sounds normal.  GI: Soft. Bowel sounds are normal. She exhibits no distension. There is no tenderness.  Musculoskeletal: She exhibits no tenderness. Trace edema in feet Neurological: She is alert and oriented. Coordination (BLE ataxic) Strength UE grossly 4 to 4+/5 with inhibition proximally due to pain. LE: 3-/5 hf,ke and 3 to 3+ adf/apf. 2- toe flex and ext Sensation remains decreased below T5-6,  Skin: Skin is warm and dry. No rash noted. No erythema.  Psych: mild  anxiety  Assessment/Plan: 1. Functional deficits secondary to T5 Cord compression/myelopathy due compression fx, metastatic breast cancer which require 3+ hours per day of interdisciplinary therapy in a comprehensive inpatient rehab setting. Physiatrist is providing close team supervision and 24 hour management of active medical problems listed below. Physiatrist and rehab team continue to assess barriers to discharge/monitor patient progress toward functional and medical goals.  Function:  Bathing Bathing position   Position: Shower  Bathing parts Body parts bathed by patient: Right arm, Left arm, Chest, Abdomen, Right upper leg, Left upper leg, Front perineal area, Right lower leg, Left lower leg Body parts bathed by helper: Buttocks, Back  Bathing assist Assist Level: Touching or steadying assistance(Pt > 75%)      Upper Body Dressing/Undressing Upper body dressing   What is the patient wearing?: Pull over shirt/dress     Pull over shirt/dress - Perfomed by patient: Thread/unthread right sleeve, Thread/unthread left sleeve, Put head through opening, Pull shirt over trunk          Upper body assist Assist Level: Set up   Set up : To obtain clothing/put away  Lower Body Dressing/Undressing Lower body dressing   What is the patient wearing?: Shoes, Socks, Pants, [employer]- Performed by patient: Thread/unthread right pants leg, Thread/unthread left pants leg Pants- Performed by helper: Pull pants up/down Non-skid slipper socks- Performed by patient: Don/doff left sock Non-skid slipper socks- Performed by helper: Don/doff right sock, Don/doff left  sock Socks - Performed by patient: Don/doff right sock, Don/doff left sock Socks - Performed by helper: Don/doff right sock, Don/doff left sock Shoes - Performed by patient: Don/doff left shoe Shoes - Performed by helper: Don/doff right shoe, Fasten right, Fasten left       TED Hose - Performed by helper: Don/doff right  TED hose, Don/doff left TED hose  Lower body assist Assist for lower body dressing: Touching or steadying assistance (Pt > 75%)      Toileting Toileting   Toileting steps completed by patient: Performs perineal hygiene Toileting steps completed by helper: Adjust clothing prior to toileting, Adjust clothing after toileting Toileting Assistive Devices:  (in [place])  [place] level: Touching or steadying assistance (Pt.75%)   Transfers Chair/bed transfer   Chair/bed transfer method: Squat pivot Chair/bed transfer assist level: Touching or steadying assistance (Pt > 75%) Chair/bed transfer assistive device: Armrests Mechanical lift: Ecologist     Max distance: 12 Assist level: 2 helpers   Wheelchair   Type: Manual Max wheelchair distance: 150 Assist Level: Supervision or verbal cues  Cognition Comprehension Comprehension assist level: Follows complex conversation/direction with no assist  Expression Expression assist level: Expresses complex ideas: With no assist  Social Interaction Social Interaction assist level: Interacts appropriately with others - No medications needed.  Problem Solving Problem solving assist level: Solves complex problems: With extra time  Memory Memory assist level: Complete Independence: No helper   Medical Problem List and Plan: 1. Functional deficits secondary to T5 cord compression/myelopathy from pathologic compression fracture, metastatic breast cancer  -while there is some mild swelling in her feet, her sensory loss probably accounts more for the "perception" of them feeling swollen---can elevate feet, use TEDS, etc  -Will attempt to contact Neurosurg again to clarify length/freq steroids 2. DVT Prophylaxis/Anticoagulation: Pharmaceutical: Lovenox indicated due to immobility/hypercoaguable state 3. Pain Management: . Continue oxycodone prn, robaxin prn.   -increased gabapentin to '400mg'$  tid  4. Adjustment reaction  with anxiety/ Mood:   -continue low dose Xanax to help with anxiety attacks.   -Team to provide ego support. Provided patient positive information regarding potential today  -LCSW to follow for evaluation and support.  5. Neuropsych: This patient is capable of making decisions on her own behalf. 6. Skin/Wound Care: Routine pressure relief measures. Maintain adequate nutrition and hydration status. 7. Fluids/Electrolytes/Nutrition: Monitor I/O. Encourage po  8. ABLA: post op  -pt asked not to be on Fe++ supp  -Repeat Hb, improved, however, potentially heme concentrated, will cont to monitor  9. HTN: Monitor BID. Continue aldactone daily and titrate as needed.  10. Mild chronic gastritis: Continue Pepcid bid.   -bears watching while on decadron, some of chest wall, hypogastric sx may be related to this.  11. Neurogenic bowel and bladder:had large bm on friday.  -voiding fairly well 12. UTI: Ucx +Klebsiella on 10/18. Bactrim started 10/18-10/23.  LOS (Days) 7 A FACE TO FACE EVALUATION WAS PERFORMED  Nyimah Shadduck Lorie Phenix 02/25/2015 8:58 AM

## 2015-02-25 NOTE — Progress Notes (Signed)
Patient very upset that she got oxycodone for pain and want it discontinued.  She does not want to increase Neurontin today--will decrease to prior dose and MD to re-evaluate in am.

## 2015-02-25 NOTE — Progress Notes (Signed)
Occupational Therapy Session Note  Patient Details  Name: Heather [name] MRN: [id number] Date of Birth: 1946-02-11  Today's Date: 02/25/2015 OT Individual Time: [id number] and 1300-1400 OT Individual Time Calculation (min): 60 min and 60 min   Short Term Goals: Week 1:  OT Short Term Goal 1 (Week 1): Pt will transfer to [facility] with min A OT Short Term Goal 2 (Week 1): Pt will complete LB dressing task with steadying assist OT Short Term Goal 3 (Week 1): Pt will maintain dynamic standing balance with steadying assist in prep for LB dressing task OT Short Term Goal 4 (Week 1): Pt will display carry over of education in functional tasks with min questioning cues  Skilled Therapeutic Interventions/Progress Updates:    Session One: Pt seen for OT ADL bathing and dressing session. Pt sitting on toilet upon arrival with NT present assisting with toileting task using STEDY. She stood in [place] with steadying assist to complete hygiene. STEDY used to transfer pt to shower. She sat on 3-1 BSC to complete showering task. She demonstrated good functional balance during task and utilized LH sponge to bathe in order to maintain spinal precautions. She dressed seated in w/c at sink, using lateral leans to pull up underwear and stood at sink to pull up pants with strong steadying assist and VCs for positioning. Pt required cues throughout session for safety awareness including locking of brakes and prepping to stand. Pt verbalized being ready to stand to pull pants up without w/c brakes locked and without AD.  Pt left set-up at sink at end of session to complete grooming tasks. NT made aware of pt's position.   Session Two: Pt seen for OT therapy session focusing on neuromuscular re-education and functional standing balance. Pt sitting in recliner upon arrival, agreeable to tx session. She completed squat pivot transfer to w/c with min A. In therapy gym, pt completed numerous sit <> stands at side of parallel bars. #2  ankle weights placed B for increased proprioceptive input and mirror placed in front for visual feedback. Pt stood with min A and min-mod steadying assist with VCs for weight shift. Pt with R and anterior lean during static standing, able to return to midline with VCs. Pt initially required B UE support holding onto rail, however, progressed to taking one UE off rail in order to complete functional task including taking clothes pins off bottom shirt in replication of LB dressing skills. Pt with R knee buckling during standing requiring min-mod A to regain balance. Longest standing trial ~30 sec- 1 min. She required increased assist for stand > sit due to decreased decentric control with VCs for sequencing/ technique.  Instruction and demonstration required for w/c part management throughout session, with decreased carry over of education from previous session.  Pt returned to room at end of session, left sitting in w/c with all needs in reach.   Therapy Documentation Precautions:  Precautions Precautions: Fall, Back Restrictions Weight Bearing Restrictions: No Pain: Pain Assessment Pain Score: 0-No pain  See Function Navigator for Current Functional Status.   Therapy/Group: Individual Therapy  Lewis, Ineze Serrao C 02/25/2015, 7:11 AM

## 2015-02-25 NOTE — Progress Notes (Addendum)
Heather [name] case was discussed during the 10/17 Brain and Spine Conference and then further discussed between Dr. [name] and [name] afterwards.   She will need postoperative radiation, but both Medical and Radiation Oncologists believe that a PET scan needs to be done first to determine the best treatment method for her.   She is currently continuing with IP rehab on [address] [place] [facility]. Her expected discharge date is Thursday 11/3. I have set up a PET scan for Friday 11/4 and a consult for her to see Dr. [name] on Monday 11/7. Her case and recent imaging will be reviewed during the 11/7  Brain and Spine Conference prior to her visit with Dr. [name].  - I visited with Heather [name] today to let her know about this plan and that I will keep her informed of things along the way. She was very happy to hear that a team of experts has reviewed her case and is making plans to take care of her as soon as she is released from Rehab. I answered all of her questions to the best of my ability and gave her my card with contact information in case she needs anything else.   Mont Dutton R.T.(R).(T). Special Procedures Navigator

## 2015-02-26 ENCOUNTER — Encounter (HOSPITAL_COMMUNITY): Payer: Medicare Other | Admitting: Occupational Therapy

## 2015-02-26 ENCOUNTER — Inpatient Hospital Stay (HOSPITAL_COMMUNITY): Payer: Medicare Other

## 2015-02-26 ENCOUNTER — Inpatient Hospital Stay (HOSPITAL_COMMUNITY): Payer: Medicare Other | Admitting: Occupational Therapy

## 2015-02-26 ENCOUNTER — Other Ambulatory Visit: Payer: Self-pay

## 2015-02-26 ENCOUNTER — Inpatient Hospital Stay (HOSPITAL_COMMUNITY): Payer: Medicare Other | Admitting: Physical Therapy

## 2015-02-26 LAB — CBC WITH DIFFERENTIAL/PLATELET
Basophils Absolute: 0 10*3/uL (ref 0.0–0.1)
Basophils Relative: 0 %
Eosinophils Absolute: 0 10*3/uL (ref 0.0–0.7)
Eosinophils Relative: 0 %
HCT: 34.2 % — ABNORMAL LOW (ref 36.0–46.0)
Hemoglobin: 11.2 g/dL — ABNORMAL LOW (ref 12.0–15.0)
Lymphocytes Relative: 7 %
Lymphs Abs: 0.8 10*3/uL (ref 0.7–4.0)
MCH: 28.6 pg (ref 26.0–34.0)
MCHC: 32.7 g/dL (ref 30.0–36.0)
MCV: 87.5 fL (ref 78.0–100.0)
Monocytes Absolute: 0.6 10*3/uL (ref 0.1–1.0)
Monocytes Relative: 6 %
Neutro Abs: 9.2 10*3/uL — ABNORMAL HIGH (ref 1.7–7.7)
Neutrophils Relative %: 87 %
Platelets: 308 10*3/uL (ref 150–400)
RBC: 3.91 MIL/uL (ref 3.87–5.11)
RDW: 14.1 % (ref 11.5–15.5)
WBC: 10.6 10*3/uL — ABNORMAL HIGH (ref 4.0–10.5)

## 2015-02-26 LAB — BASIC METABOLIC PANEL
Anion gap: 9 (ref 5–15)
BUN: 30 mg/dL — ABNORMAL HIGH (ref 6–20)
CO2: 29 mmol/L (ref 22–32)
Calcium: 9.2 mg/dL (ref 8.9–10.3)
Chloride: 98 mmol/L — ABNORMAL LOW (ref 101–111)
Creatinine, Ser: 0.96 mg/dL (ref 0.44–1.00)
GFR calc Af Amer: >60 mL/min (ref >60)
GFR calc non Af Amer: 59 mL/min — ABNORMAL LOW (ref >60)
Glucose, Bld: 165 mg/dL — ABNORMAL HIGH (ref 65–99)
Potassium: 5.1 mmol/L (ref 3.5–5.1)
Sodium: 136 mmol/L (ref 135–145)

## 2015-02-26 MED ORDER — GABAPENTIN 100 MG PO CAPS
200.0000 mg | ORAL_CAPSULE | Freq: Two times a day (BID) | ORAL | Status: DC
  Administered 2015-02-26 – 2015-03-01 (×7): 200 mg via ORAL
  Filled 2015-02-26 (×8): qty 2

## 2015-02-26 MED ORDER — GABAPENTIN 300 MG PO CAPS
300.0000 mg | ORAL_CAPSULE | Freq: Three times a day (TID) | ORAL | Status: DC
  Administered 2015-02-26: 300 mg via ORAL
  Filled 2015-02-26: qty 1

## 2015-02-26 MED ORDER — DEXAMETHASONE 4 MG PO TABS
4.0000 mg | ORAL_TABLET | Freq: Three times a day (TID) | ORAL | Status: DC
  Administered 2015-02-26 – 2015-03-04 (×17): 4 mg via ORAL
  Filled 2015-02-26 (×17): qty 1

## 2015-02-26 NOTE — Progress Notes (Addendum)
Heather [name] PHYSICAL MEDICINE & REHABILITATION     PROGRESS NOTE    Subjective/Complaints: Pt states that she had a very bad day yesterday and felt like a "zombie" due to the oxycodone.  She requested it be d/ced from her med list.  She also complains about teeth numbness and states she had this after her first surgery and chemo.  Will inquire about potential drug side effects and potentially increase dose of Gabapentin today, as pt is willing to make the change today.    ROS: mild anxiety. Denies CP, SOB, n/v/d.   Objective: Vital Signs: Blood pressure 128/57, pulse 60, temperature 97.7 F (36.5 C), temperature source Oral, resp. rate 18, height '5\' 4"'$  (1.626 m), weight 60 kg (132 lb 4.4 oz), SpO2 99 %. No results found.  Recent Labs  02/24/15 0601 02/26/15 0449  WBC 13.0* 10.6*  HGB 11.2* 11.2*  HCT 33.9* 34.2*  PLT 300 308    Recent Labs  02/24/15 0601 02/26/15 0449  NA 133* 136  K 4.4 5.1  CL 93* 98*  GLUCOSE 166* 165*  BUN 32* 30*  CREATININE 0.77 0.96  CALCIUM 9.0 9.2   CBG (last 3)  No results for input(s): GLUCAP in the last 72 hours.  Wt Readings from Last 3 Encounters:  02/25/15 60 kg (132 lb 4.4 oz)  02/14/15 58.968 kg (130 lb)  02/14/15 58.968 kg (130 lb)    Physical Exam:  Constitutional: She appears well-developed and well-nourished. No distress.  HENT: dentition intact.  Head: Normocephalic and atraumatic.  Mouth/Throat: Oropharynx is clear and moist.  Eyes: Conjunctivae and EOM are normal. Conj WNL Neck: Normal range of motion. Neck supple.  Cardiovascular: Normal rate and regular rhythm.  No murmur heard. Respiratory: Effort normal and breath sounds normal.  GI: Soft. Bowel sounds are normal. She exhibits no distension. There is no tenderness.  Musculoskeletal: She exhibits no tenderness. Trace edema in feet Neurological: She is alert and oriented. Coordination (BLE ataxic) Strength UE grossly 4 to 4+/5 with inhibition proximally due to  pain. LE: 3-/5 hf,ke and 3 to 3+ adf/apf. 2- toe flex and ext Sensation remains decreased below T5-6,  Skin: Skin is warm and dry. No rash noted. No erythema.  Psych: mild anxiety  Assessment/Plan: 1. Functional deficits secondary to T5 Cord compression/myelopathy due compression fx, metastatic breast cancer which require 3+ hours per day of interdisciplinary therapy in a comprehensive inpatient rehab setting. Physiatrist is providing close team supervision and 24 hour management of active medical problems listed below. Physiatrist and rehab team continue to assess barriers to discharge/monitor patient progress toward functional and medical goals.  Function:  Bathing Bathing position   Position: Shower  Bathing parts Body parts bathed by patient: Right arm, Left arm, Chest, Abdomen, Right upper leg, Left upper leg, Front perineal area, Right lower leg, Left lower leg, Buttocks, Back Body parts bathed by helper: Buttocks, Back  Bathing assist Assist Level: Touching or steadying assistance(Pt > 75%)      Upper Body Dressing/Undressing Upper body dressing   What is the patient wearing?: Bra, Pull over shirt/dress Bra - Perfomed by patient: Thread/unthread right bra strap, Thread/unthread left bra strap, Hook/unhook bra (pull down sports bra)   Pull over shirt/dress - Perfomed by patient: Thread/unthread right sleeve, Thread/unthread left sleeve, Put head through opening, Pull shirt over trunk          Upper body assist Assist Level: Set up   Set up : To obtain clothing/put away  Lower  Body Dressing/Undressing Lower body dressing   What is the patient wearing?: Shoes, Socks, Pants, [employer]- Performed by patient: Thread/unthread right pants leg, Thread/unthread left pants leg Pants- Performed by helper: Pull pants up/down Non-skid slipper socks- Performed by patient: Don/doff left sock Non-skid slipper socks- Performed by helper: Don/doff right sock, Don/doff left  sock Socks - Performed by patient: Don/doff right sock, Don/doff left sock Socks - Performed by helper: Don/doff right sock, Don/doff left sock Shoes - Performed by patient: Don/doff right shoe, Don/doff left shoe (Elastic Shoe laces) Shoes - Performed by helper: Don/doff right shoe, Fasten right, Fasten left       TED Hose - Performed by helper: Don/doff right TED hose, Don/doff left TED hose  Lower body assist Assist for lower body dressing: Touching or steadying assistance (Pt > 75%)      Toileting Toileting   Toileting steps completed by patient: Adjust clothing prior to toileting, Adjust clothing after toileting Toileting steps completed by helper: Performs perineal hygiene Toileting Assistive Devices: Other (comment) (stedy)  Toileting assist Assist level: Touching or steadying assistance (Pt.75%)   Transfers Chair/bed transfer   Chair/bed transfer method: Squat pivot Chair/bed transfer assist level: Touching or steadying assistance (Pt > 75%) Chair/bed transfer assistive device: Armrests Mechanical lift: Ecologist     Max distance: 12 Assist level: 2 helpers   Wheelchair   Type: Manual Max wheelchair distance: 150 Assist Level: Supervision or verbal cues  Cognition Comprehension Comprehension assist level: Follows complex conversation/direction with no assist  Expression Expression assist level: Expresses complex ideas: With no assist  Social Interaction Social Interaction assist level: Interacts appropriately with others - No medications needed.  Problem Solving Problem solving assist level: Solves complex problems: With extra time  Memory Memory assist level: Complete Independence: No helper   Medical Problem List and Plan: 1. Functional deficits secondary to T5 cord compression/myelopathy from pathologic compression fracture, metastatic breast cancer  -while there is some mild swelling in her feet, her sensory loss probably accounts more for  the "perception" of them feeling swollen---can elevate feet, use TEDS, etc  -Will attempt to contact Neurosurg again to clarify length/freq steroids 2. DVT Prophylaxis/Anticoagulation: Pharmaceutical: Lovenox indicated due to immobility/hypercoaguable state 3. Pain Management: . Continue oxycodone prn, robaxin prn.   -will increase gabapentin to '400mg'$  tid after speaking with pharmacy about potential side effects. 4. Adjustment reaction with anxiety/ Mood:   -continue low dose Xanax to help with anxiety attacks.   -Team to provide ego support. Provided patient positive information regarding potential today  -LCSW to follow for evaluation and support.  5. Neuropsych: This patient is capable of making decisions on her own behalf. 6. Skin/Wound Care: Routine pressure relief measures. Maintain adequate nutrition and hydration status. 7. Fluids/Electrolytes/Nutrition: Monitor I/O. Encourage po  8. ABLA: post op  -pt asked not to be on Fe++ supp  -Repeat Hb, stable on 10/20 9. HTN: Monitor BID. Continue aldactone daily and titrate as needed.  10. Mild chronic gastritis: Continue Pepcid bid.   -bears watching while on decadron, some of chest wall, hypogastric sx may be related to this.  11. Neurogenic bowel and bladder:had large bm on friday.  -voiding fairly well 12. UTI: Ucx +Klebsiella on 10/18. Bactrim started 10/18-10/23.  - Leukocytosis improving 10/20  LOS (Days) 8 A FACE TO FACE EVALUATION WAS PERFORMED  Heather [name] Lorie Phenix 02/26/2015 7:16 AM

## 2015-02-26 NOTE — Progress Notes (Signed)
Physical Therapy Session Note  Patient Details  Name: Heather [name] MRN: [id number] Date of Birth: 07/14/1945  Today's Date: 02/26/2015 PT Individual Time: 1615-1700 PT Individual Time Calculation (min): 45 min   Short Term Goals: Week 1:  PT Short Term Goal 1 (Week 1): Pt will be able to perform basic transfers at min assist level PT Short Term Goal 2 (Week 1): Pt will be able to gait x 30' with mod assist of 1 PT Short Term Goal 3 (Week 1): Pt will be able to demonstrate dynamic standing balance during functional task with 1 UE support with mod assist  Skilled Therapeutic Interventions/Progress Updates:   Session focused on neuro re-ed in standing frame to address postural control, standing tolerance, and BLE stability for safety and to increase patient confidence and community wheelchair mobility. Patient performed standing in standing frame x 13 minutes while participating in tabletop game using R then L UE. Performed wheelchair mobility throughout rehab unit, on/off elevators, over thresholds, and outdoors on uneven surfaces with supervision. Patient required verbal cues for management of elevating leg rests. Patient reporting feeling more down and discussed feelings of frustration with current mobility status and after education from primary PT that she will not be functional ambulator at discharge, emotional support provided throughout session. Patient left sitting in wheelchair with needs within reach.   Therapy Documentation Precautions:  Precautions Precautions: Fall, Back Restrictions Weight Bearing Restrictions: No General: PT Amount of Missed Time (min): 15 Minutes PT Missed Treatment Reason: Patient fatigue Pain: Pain Assessment Pain Assessment: No/denies pain   See Function Navigator for Current Functional Status.   Therapy/Group: Individual Therapy  Laretta Alstrom 02/26/2015, 5:18 PM

## 2015-02-26 NOTE — Progress Notes (Signed)
Physical Therapy Session Note  Patient Details  Name: Heather [name] MRN: [id number] Date of Birth: 04-10-1946  Today's Date: 02/26/2015 PT Individual Time: 0800-0900 PT Individual Time Calculation (min): 60 min   Short Term Goals: Week 1:  PT Short Term Goal 1 (Week 1): Pt will be able to perform basic transfers at min assist level PT Short Term Goal 2 (Week 1): Pt will be able to gait x 30' with mod assist of 1 PT Short Term Goal 3 (Week 1): Pt will be able to demonstrate dynamic standing balance during functional task with 1 UE support with mod assist  Skilled Therapeutic Interventions/Progress Updates:    Discussed lowering to the floor yesterday in therapy which pt attributes to the medication but states it really "shook her up". Educated on what to do in case of fall in the home, getting communication system with husband, and recommendation for carrying cell phone at all times.   Therapist assisted with donning Tedhose and pt donning shoes at w/c level to prepare for therapy session. W/c propulsion to/from therapy gym at supervision level with cues for efficient propulsion technique. Neuro re-ed for gait training with 3# ankle weights on BLE for increased proprioception using RW and maxi sky (for safety, increase pt confidence, and unweighting) to address postural control, coordination, strength, and motor control x 16' x 2 with +2 assist for movement of RW and facilitation of weightshifting. Seated in w/c performing functional strengthening and neuro re-ed to BLE on Kinetron for closed chain activity to increase proprioceptive input to BLE with focus on pt performing alternating movements in controlled pattern on resistance of 70 cm/s x 5 min with rest breaks as needed.   Therapy Documentation Precautions:  Precautions Precautions: Fall, Back Restrictions Weight Bearing Restrictions: No  Pain: Reports mild chest/pectoral pain - declines pain intervention at this time  See Function  Navigator for Current Functional Status.   Therapy/Group: Individual Therapy  [name], PT, DPT  02/26/2015, 9:24 AM

## 2015-02-26 NOTE — Progress Notes (Addendum)
Physical Therapy Session Note  Patient Details  Name: Heather [name] MRN: [id number] Date of Birth: 1945/09/26  Today's Date: 02/26/2015 PT Individual Time: 1430-1500 PT Individual Time Calculation (min): 30 min   Short Term Goals: Week 1:  PT Short Term Goal 1 (Week 1): Pt will be able to perform basic transfers at min assist level PT Short Term Goal 2 (Week 1): Pt will be able to gait x 30' with mod assist of 1 PT Short Term Goal 3 (Week 1): Pt will be able to demonstrate dynamic standing balance during functional task with 1 UE support with mod assist  Skilled Therapeutic Interventions/Progress Updates:    Session focused on seated LE therex and focus on controlling movement with 1# ankle weights for heel/toe raises and LAQ x 15 on BLE. Pt fatigued and rest breaks as needed. Discussion with patient in regards to recommendation for a ramp due to decreased progress with safe and functional gait for home entry and pt in agreement. Also discussed practicing with slideboard transfers as an option for patient in future especially with pending radiation/fatigue levels and increase overall safety.  Therapy Documentation Precautions:  Precautions Precautions: Fall, Back Restrictions Weight Bearing Restrictions: No  Pain:  reports neuropathic pain in BLE. Premedicated.    See Function Navigator for Current Functional Status.   Therapy/Group: Individual Therapy  [name], PT, DPT  02/26/2015, 4:07 PM

## 2015-02-26 NOTE — Progress Notes (Signed)
Occupational Therapy Weekly Progress Note  Patient Details  Name: Heather [name] MRN: [id number] Date of Birth: [birth year]  Beginning of progress report period: February 19, 2015 End of progress report period: February 26, 2015  Today's Date: 02/26/2015 OT Individual Time: 3810-1751 OT Individual Time Calculation (min): 55 min    Patient has met 2 of 4 short term goals.  Pt making slow progress towards OT goals. She is unable to consistently complete standing tasks with just steadying assist. She fatigues quickly and has hx of R knee buckling during functional standing tasks, requiring up to mod A to regain balance. Functional transfers completed via squat pivot with min- mod A and VCs for safety awareness.   Patient continues to demonstrate the following deficits: abnormal posture, acute pain, ataxia, muscle weakness (generalized) and paraparesis at level T-5 and therefore will continue to benefit from skilled OT intervention to enhance overall performance with BADL and Reduce care partner burden.  Patient progressing toward long term goals.  Continue plan of care.  OT Short Term Goals Week 1:  OT Short Term Goal 1 (Week 1): Pt will transfer to [facility] with min A OT Short Term Goal 1 - Progress (Week 1): Progressing toward goal OT Short Term Goal 2 (Week 1): Pt will complete LB dressing task with steadying assist OT Short Term Goal 2 - Progress (Week 1): Progressing toward goal OT Short Term Goal 3 (Week 1): Pt will maintain dynamic standing balance with steadying assist in prep for LB dressing task OT Short Term Goal 3 - Progress (Week 1): Met OT Short Term Goal 4 (Week 1): Pt will display carry over of education in functional tasks with min questioning cues OT Short Term Goal 4 - Progress (Week 1): Met Week 2:  OT Short Term Goal 1 (Week 2): Pt will complete 1 step grooming task in standing with steadying assist OT Short Term Goal 2 (Week 2): Pt will complete functional transfers using squat  pivot with supervision OT Short Term Goal 3 (Week 2): Pt will manage w/c parts independently in prep for functional tasks OT Short Term Goal 4 (Week 2): Pt will complete toileting task with steadying assist  Skilled Therapeutic Interventions/Progress Updates:    Session One: Pt seen for OT ADL bathing and dressing session. Pt sitting up in w/c upon arrival, voicing extreme fatigue from previous session, however, agreeable to tx session. She completed squat pivot transfer with min A to drop arm 3-1 BSC. She bathed seated with steadying assist and VCs for completing lateral leans. She dressed seated in w/c at the sink, requiring total A to pull up pants due to fatigue and decreased standing balance. Pt requested return to bed at end of session, completing stand pivot to bed with min A. Pt left in supine at end of session, all needs in reach.   Session Two: Pt seen for OT therapy session focusing on ADL re-training and functional transfers. Pt sitting on toilet upon arrival, set-up with STEDY, requesting assist for toileting. She stood at steady with supervision. She declined attempt at hygiene, requesting total A due to fatigue. Once pants started, pt able to finish pulling pants up. Hand hygiene completed in standing with STEDY.  Pt introduced to sliding boardd and education/ demonstration regarding use provided. She then completed functional transfer w/c <> drop arm BSC using sliding board with overall min A and VCs for technique/ sequencing. Pt voiced being anxious about transfer, however, felt comfortable use board after transfer complete. Pt  taken to therapy gym for hand off to PT at end of session. Pt educated throughout session regarding functional transfers, home DME, and d/c planning.    Therapy Documentation Precautions:  Precautions Precautions: Fall, Back Restrictions Weight Bearing Restrictions: No Pain: Pain Assessment Pain Score: 6  Pain Type: Neuropathic pain Pain Location:  Chest Pain Orientation: Mid Pain Descriptors / Indicators: Burning Pain Intervention(s): Repositioned;Ambulation/increased activity;Shower  See Function Navigator for Current Functional Status.   Therapy/Group: Individual Therapy  Lewis, Dmarcus Decicco C 02/26/2015, 7:15 AM

## 2015-02-27 ENCOUNTER — Inpatient Hospital Stay (HOSPITAL_COMMUNITY): Payer: Medicare Other

## 2015-02-27 ENCOUNTER — Inpatient Hospital Stay (HOSPITAL_COMMUNITY): Payer: Medicare Other | Admitting: Occupational Therapy

## 2015-02-27 ENCOUNTER — Telehealth: Payer: Self-pay | Admitting: Hematology and Oncology

## 2015-02-27 NOTE — Plan of Care (Signed)
Problem: RH PAIN MANAGEMENT Goal: RH STG PAIN MANAGED AT OR BELOW PT'S PAIN GOAL Pain <4  Outcome: Progressing No c/o pain

## 2015-02-27 NOTE — Progress Notes (Signed)
Occupational Therapy Session Note  Patient Details  Name: Heather [name] MRN: [id number] Date of Birth: 07-28-1945  Today's Date: 02/27/2015 OT Individual Time: 1040-1110 OT Individual Time Calculation (min): 30 min    Short Term Goals: Week 2:  OT Short Term Goal 1 (Week 2): Pt will complete 1 step grooming task in standing with steadying assist OT Short Term Goal 2 (Week 2): Pt will complete functional transfers using squat pivot with supervision OT Short Term Goal 3 (Week 2): Pt will manage w/c parts independently in prep for functional tasks OT Short Term Goal 4 (Week 2): Pt will complete toileting task with steadying assist  Skilled Therapeutic Interventions/Progress Updates: Therapeutic activity with focus on squat pivot transfer and core strengthening to improve dynamic sitting balance.   Pt received seated in w/c and reporting awareness of need for improved stability at trunk/core during dynamic sitting and standing.   Pt escorted to ortho gym and transfers to raised mat with steadying assist.  Pt completes anterior/posterior and oblique leans while sitting at edge of mat with steadying assist throughout session.   Pt educated on technique to improve postural control, foot positioning, and endurance.   Pt completes sit<>stand at mat 3 times and holds stand supported in RW for up to 5 seconds without contact from therapist.   Pt requires min- mod assist to maintain prolonged standing balance (greater than 15 seconds) during each stand.  Pt returns to edge of mat and completes transfer back to w/c with mod assist d/t left leg buckling.        Therapy Documentation Precautions:  Precautions Precautions: Fall, Back Restrictions Weight Bearing Restrictions: No  Pain: No/denies pain    See Function Navigator for Current Functional Status.   Therapy/Group: Individual Therapy  Oil Trough 02/27/2015, 12:32 PM

## 2015-02-27 NOTE — Progress Notes (Signed)
Physical Therapy Session Note  Patient Details  Name: Heather [name] MRN: [id number] Date of Birth: 1946/01/11  Today's Date: 02/27/2015 PT Individual Time: 0900-1000 PT Individual Time Calculation (min): 60 min   Skilled Therapeutic Interventions/Progress Updates:  Neuro re-ed during gait and stairs with PT clinical specialist to address and facilitate gait, reciprocal stepping pattern, coordination, foot placement, weightshifting over L hip during gait and stairs to decrease hyperextension, postural control, and overall balance/strength. Performed 20' of gait with RW and 3# ankle weights for increased proprioceptive input. Up/down 8 steps (3" steps) with rails and ankle weights as described above.  Practiced functional transfer training with slideboard on/off mat and then to simulated car after discussion yesterday with patient in regards to benefits of use of slideboard for transfers. Focused on technique, head hips relationship, hand placement, and importance of foot placement and positioning. Performed at steady assist level.   Therapy Documentation Precautions:  Precautions Precautions: Fall, Back Restrictions Weight Bearing Restrictions: No Pain:  Reports generalized pain - medications given this AM.   See Function Navigator for Current Functional Status.   Therapy/Group: Individual Therapy  [name], PT, DPT  02/27/2015, 12:14 PM

## 2015-02-27 NOTE — Telephone Encounter (Signed)
Called patient and she is aware of her appointments °

## 2015-02-27 NOTE — Progress Notes (Signed)
Occupational Therapy Session Note  Patient Details  Name: Heather [name] MRN: [id number] Date of Birth: 03/22/1946  Today's Date: 02/27/2015 OT Individual Time: 0800-0900 OT Individual Time Calculation (min): 60 min    Short Term Goals: Week 2:  OT Short Term Goal 1 (Week 2): Pt will complete 1 step grooming task in standing with steadying assist OT Short Term Goal 2 (Week 2): Pt will complete functional transfers using squat pivot with supervision OT Short Term Goal 3 (Week 2): Pt will manage w/c parts independently in prep for functional tasks OT Short Term Goal 4 (Week 2): Pt will complete toileting task with steadying assist  Skilled Therapeutic Interventions/Progress Updates:    Pt sitting in w/c upon arrival.  Pt stated that she had nursing staff assist with dressing tasks because she didn't feet comfortable with a female OT assisting.  Pt's preference noted and communicated with scheduling personnel on scheduling board.  Pt donned Ted hose independently while seated in w/c.  Pt propelled to therapy gym and engaged in practicing squat pivot transfers w/c<>mat X 3 with close supervision.  Pt also practiced w/c management including removing and replacing extending leg rests.  Pt completed task X 2 with more than a reasonable amount of time to complete task.  Pt transitioned to sit<>stand task.  Pt practiced sit<>stand X 5 with focus on sequencing and appropriate body positioning prior to task.  Pt required min A for sit<>stand with verbal cues for positioning. Pt returned to room and remained in w/c awaiting her next therapy session.   Therapy Documentation Precautions:  Precautions Precautions: Fall, Back Restrictions Weight Bearing Restrictions: No Pain:  Pt c/o 3/10 pain in BLE; neuropathy; RN present and admin meds at beginning of session  See Function Navigator for Current Functional Status.   Therapy/Group: Individual Therapy  Leroy Libman 02/27/2015, 9:03 AM

## 2015-02-27 NOTE — Progress Notes (Signed)
[place] [facility] PHYSICAL MEDICINE & REHABILITATION     PROGRESS NOTE    Subjective/Complaints: Patient states she would like to decrease her dose of gabapentin. She states that it is making her feel "weird it out". She also notes that she has been trying to move around in the bed so she doesn't get sore in the morning. She also notes ankle stiffness this morning.  ROS: mild anxiety. Denies CP, SOB, n/v/d.   Objective: Vital Signs: Blood pressure 129/58, pulse 62, temperature 97.8 F (36.6 C), temperature source Oral, resp. rate 17, height '5\' 4"'$  (1.626 m), weight 54.7 kg (120 lb 9.5 oz), SpO2 98 %. No results found.  Recent Labs  02/26/15 0449  WBC 10.6*  HGB 11.2*  HCT 34.2*  PLT 308    Recent Labs  02/26/15 0449  NA 136  K 5.1  CL 98*  GLUCOSE 165*  BUN 30*  CREATININE 0.96  CALCIUM 9.2   CBG (last 3)  No results for input(s): GLUCAP in the last 72 hours.  Wt Readings from Last 3 Encounters:  02/27/15 54.7 kg (120 lb 9.5 oz)  02/14/15 58.968 kg (130 lb)  02/14/15 58.968 kg (130 lb)    Physical Exam:  Constitutional: She appears well-developed and well-nourished. No distress.  HENT: dentition intact.  Head: Normocephalic and atraumatic.  Mouth/Throat: Oropharynx is clear and moist.  Eyes: Conjunctivae and EOM are normal. Conj WNL Neck: Normal range of motion. Neck supple.  Cardiovascular: Normal rate and regular rhythm.  No murmur heard. Respiratory: Effort normal and breath sounds normal.  GI: Soft. Bowel sounds are normal. She exhibits no distension. There is no tenderness.  Musculoskeletal: She exhibits no tenderness. Trace edema in feet Neurological: She is alert and oriented. Coordination (BLE ataxic) Strength UE grossly 4 to 4+/5 with inhibition proximally due to pain. LE: 3-/5 hf,ke and 3 to 3+ adf/apf. 2- toe flex and ext Sensation remains decreased below T5-6,  Skin: Skin is warm and dry. No rash noted. No erythema.  Psych: mild  anxiety  Assessment/Plan: 1. Functional deficits secondary to T5 Cord compression/myelopathy due compression fx, metastatic breast cancer which require 3+ hours per day of interdisciplinary therapy in a comprehensive inpatient rehab setting. Physiatrist is providing close team supervision and 24 hour management of active medical problems listed below. Physiatrist and rehab team continue to assess barriers to discharge/monitor patient progress toward functional and medical goals.  Function:  Bathing Bathing position   Position: Shower  Bathing parts Body parts bathed by patient: Right arm, Left arm, Chest, Abdomen, Right upper leg, Left upper leg, Front perineal area, Right lower leg, Left lower leg, Buttocks Body parts bathed by helper: Back  Bathing assist Assist Level: Touching or steadying assistance(Pt > 75%)      Upper Body Dressing/Undressing Upper body dressing   What is the patient wearing?: Bra, Pull over shirt/dress Bra - Perfomed by patient: Thread/unthread right bra strap, Thread/unthread left bra strap, Hook/unhook bra (pull down sports bra)   Pull over shirt/dress - Perfomed by patient: Thread/unthread right sleeve, Thread/unthread left sleeve, Put head through opening, Pull shirt over trunk          Upper body assist Assist Level: More than reasonable time   Set up : To obtain clothing/put away  Lower Body Dressing/Undressing Lower body dressing   What is the patient wearing?: Shoes, Socks, Pants, Underwear Underwear - Performed by patient: Thread/unthread right underwear leg, Thread/unthread left underwear leg   Pants- Performed by patient: Thread/unthread right  pants leg, Thread/unthread left pants leg Pants- Performed by helper: Pull pants up/down Non-skid slipper socks- Performed by patient: Don/doff left sock Non-skid slipper socks- Performed by helper: Don/doff right sock, Don/doff left sock Socks - Performed by patient: Don/doff right sock, Don/doff left  sock Socks - Performed by helper: Don/doff right sock, Don/doff left sock Shoes - Performed by patient: Don/doff right shoe, Don/doff left shoe (Elastic Shoe laces) Shoes - Performed by helper: Don/doff right shoe, Fasten right, Fasten left       TED Hose - Performed by helper: Don/doff right TED hose, Don/doff left TED hose  Lower body assist Assist for lower body dressing: Touching or steadying assistance (Pt > 75%)      Toileting Toileting   Toileting steps completed by patient: Adjust clothing prior to toileting, Adjust clothing after toileting Toileting steps completed by helper: Adjust clothing prior to toileting, Performs perineal hygiene, Adjust clothing after toileting Toileting Assistive Devices:  (stedy)  Toileting assist Assist level: Touching or steadying assistance (Pt.75%)   Transfers Chair/bed transfer   Chair/bed transfer method: Squat pivot Chair/bed transfer assist level: Touching or steadying assistance (Pt > 75%) Chair/bed transfer assistive device: Armrests Mechanical lift: Ecologist     Max distance: 16 Assist level: 2 helpers   Wheelchair   Type: Manual Max wheelchair distance: 200 ft Assist Level: Supervision or verbal cues  Cognition Comprehension Comprehension assist level: Follows complex conversation/direction with no assist  Expression Expression assist level: Expresses complex ideas: With no assist  Social Interaction Social Interaction assist level: Interacts appropriately with others - No medications needed.  Problem Solving Problem solving assist level: Solves complex problems: With extra time  Memory Memory assist level: Complete Independence: No helper   Medical Problem List and Plan: 1. Functional deficits secondary to T5 cord compression/myelopathy from pathologic compression fracture, metastatic breast cancer  -while there is some mild swelling in her feet, her sensory loss probably accounts more for the  "perception" of them feeling swollen---can elevate feet, use TEDS, etc  -Started weaning steroids on 10/20 2. DVT Prophylaxis/Anticoagulation: Pharmaceutical: Lovenox indicated due to immobility/hypercoaguable state 3. Pain Management: . Continue oxycodone prn, robaxin prn.   -Gabapentin 200 BID after per pt preference.  Increasing the dose will likely improve the patient's symptoms, however, patient would like to maintain medication at the current dose due to concern of side effects. 4. Adjustment reaction with anxiety/ Mood:   -continue low dose Xanax to help with anxiety attacks.   -Team to provide ego support. Provided patient positive information regarding potential today  -LCSW to follow for evaluation and support.  5. Neuropsych: This patient is capable of making decisions on her own behalf. 6. Skin/Wound Care: Routine pressure relief measures. Maintain adequate nutrition and hydration status. 7. Fluids/Electrolytes/Nutrition: Monitor I/O. Encourage po  8. ABLA: post op  -pt asked not to be on Fe++ supp  -Repeat Hb, stable on 10/20 9. HTN: Monitor BID. Continue aldactone daily and titrate as needed.  10. Mild chronic gastritis: Continue Pepcid bid.   -bears watching while on decadron, some of chest wall, hypogastric sx may be related to this.  11. Neurogenic bowel and bladder:had large bm on friday.  -voiding fairly well 12. UTI: Ucx +Klebsiella on 10/18. Bactrim started 10/18-10/23.  - Leukocytosis improving 10/20  LOS (Days) 9 A FACE TO FACE EVALUATION WAS PERFORMED  [name] 02/27/2015 7:59 AM

## 2015-02-27 NOTE — Progress Notes (Signed)
Occupational Therapy Session Note  Patient Details  Name: Heather [name] MRN: [id number] Date of Birth: 12/14/1945  Today's Date: 02/27/2015 OT Individual Time: 1545-1630  OT Individual Time Calculation (min): 45min    Short Term Goals: Week 1:  OT Short Term Goal 1 (Week 1): Pt will transfer to BSC with min A OT Short Term Goal 1 - Progress (Week 1): Progressing toward goal OT Short Term Goal 2 (Week 1): Pt will complete LB dressing task with steadying assist OT Short Term Goal 2 - Progress (Week 1): Progressing toward goal OT Short Term Goal 3 (Week 1): Pt will maintain dynamic standing balance with steadying assist in prep for LB dressing task OT Short Term Goal 3 - Progress (Week 1): Met OT Short Term Goal 4 (Week 1): Pt will display carry over of education in functional tasks with min questioning cues OT Short Term Goal 4 - Progress (Week 1): Met Week 2:  OT Short Term Goal 1 (Week 2): Pt will complete 1 step grooming task in standing with steadying assist OT Short Term Goal 2 (Week 2): Pt will complete functional transfers using squat pivot with supervision OT Short Term Goal 3 (Week 2): Pt will manage w/c parts independently in prep for functional tasks OT Short Term Goal 4 (Week 2): Pt will complete toileting task with steadying assist  Skilled Therapeutic Interventions/Progress Updates:    Engaged in reaching activities, trunk strength, UE strengthening.  Used arm reach activity and pt able to reach Left arm 25 inches with back support; right arm 30 Inches with back support.  Left arm reach without back support= 23   Inches and right arm reach without back support= 24 inches.  Used forward pass for 1 minute intervals for 3 times.  Used 1.5 # wt for UE sho., triceps and biceps.  Pt propelled self to room with no assistance.   Therapy Documentation Precautions:  Precautions Precautions: Fall, Back Restrictions Weight Bearing Restrictions: No      Pain: Pain  Assessment Pain Score: 6  Pain Type: Neuropathic pain Pain Location: Chest Pain Orientation: Mid Pain Descriptors / Indicators: Burning Pain Intervention(s): Repositioned;Ambulation/increased activity;Shower          See Function Navigator for Current Functional Status.   Therapy/Group: Individual Therapy  Edwards, Elizabeth J 02/27/2015, 3:57 PM  

## 2015-02-27 NOTE — Progress Notes (Signed)
Occupational Therapy Session Note  Patient Details  Name: Heather [name] MRN: [id number] Date of Birth: 08-26-1945  Today's Date: 02/27/2015 OT Individual Time: 1400-1500 OT Individual Time Calculation (min): 60 min    Short Term Goals: Week 2:  OT Short Term Goal 1 (Week 2): Pt will complete 1 step grooming task in standing with steadying assist OT Short Term Goal 2 (Week 2): Pt will complete functional transfers using squat pivot with supervision OT Short Term Goal 3 (Week 2): Pt will manage w/c parts independently in prep for functional tasks OT Short Term Goal 4 (Week 2): Pt will complete toileting task with steadying assist  Skilled Therapeutic Interventions/Progress Updates:    Pt seen for OT ADL bathing and dressing session. Pt sitting in w/c upon arrival, voicing frustration at inability to reach phone charger due to w/c leg rests in the way. Problem solved with pt how to access charger using reacher and possibility of removing leg rests. She voiced desire for showering task. Encouraged pt to use sliding board for transfer for increased independence and safety with transfer. W/c<> tub shower using sliding board completed with min A. Pt bathed seated on tub shower bench with supervision and VCs to maintain spinal pre-cautions. She dressed seated in w/c,  Completing lateral leans with assist to get pants up and rest breaks throughout. VCs provided during LB dressing to maintain spinal pre-cautions. She completed grooming tasks seated at the sink. Pillow placed under pt's R UE for support while pt blow dried hair as pt once needed assist to complete task due to UE fatigue/ pain.  Pt left sitting in w/c at end of session, set-up at sink completing grooming tasks.  Education provided throughout session regarding OT goals, POC, spinal pre-cautions, family training, and d/c planning.   Therapy Documentation Precautions:  Precautions Precautions: Fall, Back Restrictions Weight Bearing  Restrictions: No Pain: Pain Assessment Pain Score: 6  Pain Type: Neuropathic pain Pain Location: Chest Pain Orientation: Mid Pain Descriptors / Indicators: Burning Pain Intervention(s): Repositioned;Ambulation/increased activity;Shower  See Function Navigator for Current Functional Status.   Therapy/Group: Individual Therapy  Lewis, Rajiv Parlato C 02/27/2015, 2:36 PM

## 2015-02-27 NOTE — Plan of Care (Signed)
PT LTG's for gait and stairs discharged and modified due to progress. See Care Plan for details.   Lars Masson, PT, DPT

## 2015-02-28 ENCOUNTER — Inpatient Hospital Stay (HOSPITAL_COMMUNITY): Payer: Medicare Other | Admitting: Occupational Therapy

## 2015-02-28 MED ORDER — MENTHOL 3 MG MT LOZG
1.0000 | LOZENGE | OROMUCOSAL | Status: DC | PRN
  Administered 2015-02-28 – 2015-03-01 (×2): 3 mg via ORAL
  Filled 2015-02-28: qty 9

## 2015-02-28 NOTE — Progress Notes (Signed)
[facility] PHYSICAL MEDICINE & REHABILITATION     PROGRESS NOTE    Subjective/Complaints: Patient states that she had some GERD overnight but took some medication and that has since improved. She also notes that she had some blurry vision couple of days ago, but that is getting better as well. Her main complaint continues to be burning pain in her lower extremities. We again talked about the possibility of increasing her gabapentin, but she is hesitant to do this at this time. She would like to see how she does in therapies today and if she feels she will need increase in medication we will increase tomorrow.  ROS: Anxiety. Denies CP, SOB, n/v/d.   Objective: Vital Signs: Blood pressure 128/66, pulse 66, temperature 97.6 F (36.4 C), temperature source Oral, resp. rate 18, height '5\' 4"'$  (1.626 m), weight 54.7 kg (120 lb 9.5 oz), SpO2 97 %. No results found.  Recent Labs  02/26/15 0449  WBC 10.6*  HGB 11.2*  HCT 34.2*  PLT 308    Recent Labs  02/26/15 0449  NA 136  K 5.1  CL 98*  GLUCOSE 165*  BUN 30*  CREATININE 0.96  CALCIUM 9.2   CBG (last 3)  No results for input(s): GLUCAP in the last 72 hours.  Wt Readings from Last 3 Encounters:  02/27/15 54.7 kg (120 lb 9.5 oz)  02/14/15 58.968 kg (130 lb)  02/14/15 58.968 kg (130 lb)    Physical Exam:  Constitutional: She appears well-developed and well-nourished. No distress.  HENT: dentition intact.  Head: Normocephalic and atraumatic.  Mouth/Throat: Oropharynx is clear and moist.  Eyes: Conjunctivae and EOM are normal. Conj WNL Neck: Normal range of motion. Neck supple.  Cardiovascular: Normal rate and regular rhythm.  No murmur heard. Respiratory: Effort normal and breath sounds normal.  GI: Soft. Bowel sounds are normal. She exhibits no distension. There is no tenderness.  Musculoskeletal: She exhibits no tenderness. Trace edema in feet Neurological: She is alert and oriented. Coordination (BLE ataxic)  Strength UE grossly 4 to 4+/5 with inhibition proximally due to pain. LE: 3-/5 hf,ke and 3 to 3+ adf/apf. 2- toe flex and ext Sensation remains decreased below T5-6,  Skin: Skin is warm and dry. No rash noted. No erythema.  Psych: mild anxiety  Assessment/Plan: 1. Functional deficits secondary to T5 Cord compression/myelopathy due compression fx, metastatic breast cancer which require 3+ hours per day of interdisciplinary therapy in a comprehensive inpatient rehab setting. Physiatrist is providing close team supervision and 24 hour management of active medical problems listed below. Physiatrist and rehab team continue to assess barriers to discharge/monitor patient progress toward functional and medical goals.  Function:  Bathing Bathing position   Position: Shower  Bathing parts Body parts bathed by patient: Right arm, Left arm, Chest, Abdomen, Right upper leg, Left upper leg, Front perineal area, Right lower leg, Left lower leg, Buttocks, Back Body parts bathed by helper: Back  Bathing assist Assist Level: Touching or steadying assistance(Pt > 75%)      Upper Body Dressing/Undressing Upper body dressing   What is the patient wearing?: Bra, Pull over shirt/dress Bra - Perfomed by patient: Hook/unhook bra (pull down sports bra) (Sports bra)   Pull over shirt/dress - Perfomed by patient: Thread/unthread right sleeve, Thread/unthread left sleeve, Put head through opening, Pull shirt over trunk          Upper body assist Assist Level: More than reasonable time   Set up : To obtain clothing/put away  Lower  Body Dressing/Undressing Lower body dressing   What is the patient wearing?: Shoes, Socks, Pants, Underwear Underwear - Performed by patient: Thread/unthread right underwear leg, Thread/unthread left underwear leg Underwear - Performed by helper: Pull underwear up/down Pants- Performed by patient: Thread/unthread right pants leg, Thread/unthread left pants leg Pants- Performed by  helper: Pull pants up/down Non-skid slipper socks- Performed by patient: Don/doff left sock Non-skid slipper socks- Performed by helper: Don/doff right sock, Don/doff left sock Socks - Performed by patient: Don/doff right sock, Don/doff left sock Socks - Performed by helper: Don/doff right sock, Don/doff left sock Shoes - Performed by patient: Don/doff right shoe, Don/doff left shoe (Elastic Shoe laces) Shoes - Performed by helper: Don/doff right shoe, Fasten right, Fasten left     TED Hose - Performed by patient: Don/doff right TED hose, Don/doff left TED hose TED Hose - Performed by helper: Don/doff right TED hose, Don/doff left TED hose  Lower body assist Assist for lower body dressing: Touching or steadying assistance (Pt > 75%)      Toileting Toileting   Toileting steps completed by patient: Adjust clothing prior to toileting, Adjust clothing after toileting Toileting steps completed by helper: Adjust clothing prior to toileting, Performs perineal hygiene, Adjust clothing after toileting Toileting Assistive Devices:  (stedy)  Toileting assist Assist level: Touching or steadying assistance (Pt.75%)   Transfers Chair/bed transfer   Chair/bed transfer method: Lateral scoot Chair/bed transfer assist level: Touching or steadying assistance (Pt > 75%) Chair/bed transfer assistive device: Sliding board, Armrests Mechanical lift: Ecologist     Max distance: 20 Assist level: 2 helpers   Wheelchair   Type: Manual Max wheelchair distance: 150 Assist Level: No help, No cues, assistive device, takes more than reasonable amount of time  Cognition Comprehension Comprehension assist level: Follows complex conversation/direction with no assist  Expression Expression assist level: Expresses complex ideas: With extra time/assistive device  Social Interaction Social Interaction assist level: Interacts appropriately with others - No medications needed.  Problem Solving  Problem solving assist level: Solves complex problems: With extra time  Memory Memory assist level: Complete Independence: No helper   Medical Problem List and Plan: 1. Functional deficits secondary to T5 cord compression/myelopathy from pathologic compression fracture, metastatic breast cancer  -while there is some mild swelling in her feet, her sensory loss probably accounts more for the "perception" of them feeling swollen---can elevate feet, use TEDS, etc  -Started weaning steroids on 10/20 2. DVT Prophylaxis/Anticoagulation: Pharmaceutical: Lovenox indicated due to immobility/hypercoaguable state 3. Pain Management: . Continue oxycodone prn, robaxin prn.   -Gabapentin 200 BID after per pt preference.  Increasing the dose will likely improve the patient's symptoms, however, patient would like to maintain medication at the current dose at present due to concern of side effects. Will reevaluate and possibly increase medication tomorrow. 4. Adjustment reaction with anxiety/ Mood:   -continue low dose Xanax to help with anxiety attacks.   -Team to provide ego support. Provided patient positive information regarding potential today  -LCSW to follow for evaluation and support.  5. Neuropsych: This patient is capable of making decisions on her own behalf. 6. Skin/Wound Care: Routine pressure relief measures. Maintain adequate nutrition and hydration status. 7. Fluids/Electrolytes/Nutrition: Monitor I/O. Encourage po  8. ABLA: post op  -pt asked not to be on Fe++ supp  -Repeat Hb, stable on 10/20 9. HTN: Monitor BID. Continue aldactone daily and titrate as needed.  10. Mild chronic gastritis: Continue Pepcid bid.   -bears watching  while on decadron, some of chest wall, hypogastric sx may be related to this.  11. Neurogenic bowel and bladder:had large bm on friday.  -voiding fairly well 12. UTI: Ucx +Klebsiella on 10/18. Bactrim started 10/18-10/23.  - Leukocytosis improving 10/20  LOS  (Days) 10 A FACE TO FACE EVALUATION WAS PERFORMED  Heather [name] Lorie Phenix 02/28/2015 7:34 AM

## 2015-02-28 NOTE — Progress Notes (Signed)
Occupational Therapy Session Note  Patient Details  Name: Latrecia S Korver MRN: [id number] Date of Birth: 12/11/1945  Today's Date: 02/28/2015 OT Individual Time:  -   1430-1530  (60 min)      Short Term Goals: Week 1:  OT Short Term Goal 1 (Week 1): Pt will transfer to BSC with min A OT Short Term Goal 1 - Progress (Week 1): Progressing toward goal OT Short Term Goal 2 (Week 1): Pt will complete LB dressing task with steadying assist OT Short Term Goal 2 - Progress (Week 1): Progressing toward goal OT Short Term Goal 3 (Week 1): Pt will maintain dynamic standing balance with steadying assist in prep for LB dressing task OT Short Term Goal 3 - Progress (Week 1): Met OT Short Term Goal 4 (Week 1): Pt will display carry over of education in functional tasks with min questioning cues OT Short Term Goal 4 - Progress (Week 1): Met Week 2:  OT Short Term Goal 1 (Week 2): Pt will complete 1 step grooming task in standing with steadying assist OT Short Term Goal 2 (Week 2): Pt will complete functional transfers using squat pivot with supervision OT Short Term Goal 3 (Week 2): Pt will manage w/c parts independently in prep for functional tasks OT Short Term Goal 4 (Week 2): Pt will complete toileting task with steadying assist  Skilled Therapeutic Interventions/Progress Updates:    Engaged in wc mobility on unit and outside.  Performed 60 fleet incline/decline with min assist.  Did isometric with trunk in all directions.  Pt was weakest on holding posterior and to right;  She was strongest in holding anteriorly and to the left.  Provided wc gloves.   Pt. Left in room with family.   Therapy Documentation Precautions:  Precautions Precautions: Fall, Back Restrictions Weight Bearing Restrictions: No General:     Pain:  none             See Function Navigator for Current Functional Status.   Therapy/Group: Individual Therapy  Edwards, Elizabeth J 02/28/2015, 5:28 PM  

## 2015-03-01 ENCOUNTER — Inpatient Hospital Stay (HOSPITAL_COMMUNITY): Payer: Medicare Other

## 2015-03-01 NOTE — Progress Notes (Signed)
Physical Therapy Weekly Progress Note  Patient Details  Name: Heather [name] MRN: [id number] Date of Birth: 05-29-1945  Beginning of progress report period: February 19, 2015 End of progress report period: March 01, 2015  Today's Date: 03/01/2015 PT Individual Time: 0900-1000 PT Individual Time Calculation (min): 60 min  Neuro re-ed to address and facilitate gait, reciprocal stepping pattern, coordination, foot placement, weightshifting over L hip during gait to decrease hyperextension, postural control, and overall balance/strength. Performed 30', x 38', x 20' of gait with RW and 3# ankle weights for increased proprioceptive input with +2 assist. Discussed goals and home set-up as well as overall energy conservation techniques in regards to multiple appointments scheduled after discharge from [facility]. Pt propelled w/c to and from therapy at mod I level for overall endurance and strengthening.    Patient has met 1 of 3 short term goals. Pt is making slow progress with functional standing/gait due to impairments listed below. Have started to prepare patient for home modifications for w/c level mobility and introduced slideboard transfers as well to increase independence and prepare for possible fatigue especially once starting radiation treatment.  Patient continues to demonstrate the following deficits: paraparesis, endurance, pain, strength, coordination, sensation/proprioception, functional mobility and therefore will continue to benefit from skilled PT intervention to enhance overall performance with activity tolerance, balance, postural control, ability to compensate for deficits, functional use of  right lower extremity and left lower extremity, coordination and knowledge of precautions.  Patient not progressing toward long term goals.  See goal revision..  Plan of care revisions: goals in regards to gait, balance, and stairs..  PT Short Term Goals Week 1:  PT Short Term Goal 1 (Week 1): Pt  will be able to perform basic transfers at min assist level PT Short Term Goal 1 - Progress (Week 1): Met PT Short Term Goal 2 (Week 1): Pt will be able to gait x 30' with mod assist of 1 PT Short Term Goal 2 - Progress (Week 1): Not met PT Short Term Goal 3 (Week 1): Pt will be able to demonstrate dynamic standing balance during functional task with 1 UE support with mod assist PT Short Term Goal 3 - Progress (Week 1): Not met Week 2:  PT Short Term Goal 1 (Week 2): = LTGs  Skilled Therapeutic Interventions/Progress Updates:  Training and development officer;Ambulation/gait training;Community reintegration;Discharge planning;Disease management/prevention;DME/adaptive equipment instruction;Functional mobility training;Neuromuscular re-education;Pain management;Patient/family education;Psychosocial support;Skin care/wound management;Splinting/orthotics;Stair training;Therapeutic Activities;Therapeutic Exercise;UE/LE Strength taining/ROM;UE/LE Coordination activities;Wheelchair propulsion/positioning   Therapy Documentation Precautions:  Precautions Precautions: Fall, Back Restrictions Weight Bearing Restrictions: No  Pain:  Premedicated for pain in chest and back.   See Function Navigator for Current Functional Status.  Therapy/Group: Individual Therapy  [name], PT, DPT  03/01/2015, 10:30 AM

## 2015-03-01 NOTE — Progress Notes (Signed)
At HS complained of "scratchy" throat with nasal drainage. Paged Dr. Posey Pronto, cepacol loz. Ordered. Also, Complained of "sharp, shooting" pain under left breast. Heat pack placed to area with "some" relief. K-pad ordered. Heather [name] A

## 2015-03-01 NOTE — Progress Notes (Signed)
[facility] PHYSICAL MEDICINE & REHABILITATION     PROGRESS NOTE    Subjective/Complaints: This morning patient states that she has a "scratchy throat". She is not sure if she picked up something when she was taking outside yesterday, but she denies any other associated symptoms. She complained yesterday about discomfort under her left breast and today states that it is improved with a heating pad. She mentioned that she would like to wait another day before considering increasing her neuropathic pain medication.  ROS: Anxiety. Denies CP, SOB, n/v/d.   Objective: Vital Signs: Blood pressure 126/59, pulse 66, temperature 97.9 F (36.6 C), temperature source Oral, resp. rate 17, height '5\' 4"'$  (1.626 m), weight 54.7 kg (120 lb 9.5 oz), SpO2 96 %. No results found. No results for input(s): WBC, HGB, HCT, PLT in the last 72 hours. No results for input(s): NA, K, CL, GLUCOSE, BUN, CREATININE, CALCIUM in the last 72 hours.  Invalid input(s): CO CBG (last 3)  No results for input(s): GLUCAP in the last 72 hours.  Wt Readings from Last 3 Encounters:  02/27/15 54.7 kg (120 lb 9.5 oz)  02/14/15 58.968 kg (130 lb)  02/14/15 58.968 kg (130 lb)    Physical Exam:  Constitutional: She appears well-developed and well-nourished. No distress.  HENT: dentition intact.  Head: Normocephalic and atraumatic.  Mouth/Throat: Oropharynx is clear and moist.  Eyes: Conjunctivae and EOM are normal. Conj WNL Neck: Normal range of motion. Neck supple.  Cardiovascular: Normal rate and regular rhythm.  No murmur heard. Respiratory: Effort normal and breath sounds normal.  GI: Soft. Bowel sounds are normal. She exhibits no distension. There is no tenderness.  Musculoskeletal: She exhibits no tenderness. Trace edema in feet Neurological: She is alert and oriented. Coordination (BLE ataxic) Strength UE grossly 4 to 4+/5 with inhibition proximally due to pain. LE: >/3/5 grossly Sensation remains decreased  below T5-6,  Skin: Skin is warm and dry. No rash noted. No erythema.  Psych: mild anxiety  Assessment/Plan: 1. Functional deficits secondary to T5 Cord compression/myelopathy due compression fx, metastatic breast cancer which require 3+ hours per day of interdisciplinary therapy in a comprehensive inpatient rehab setting. Physiatrist is providing close team supervision and 24 hour management of active medical problems listed below. Physiatrist and rehab team continue to assess barriers to discharge/monitor patient progress toward functional and medical goals.  Function:  Bathing Bathing position   Position: Shower  Bathing parts Body parts bathed by patient: Right arm, Left arm, Chest, Abdomen, Right upper leg, Left upper leg, Front perineal area, Right lower leg, Left lower leg, Buttocks, Back Body parts bathed by helper: Back  Bathing assist Assist Level: Touching or steadying assistance(Pt > 75%)      Upper Body Dressing/Undressing Upper body dressing   What is the patient wearing?: Bra, Pull over shirt/dress Bra - Perfomed by patient: Hook/unhook bra (pull down sports bra) (Sports bra)   Pull over shirt/dress - Perfomed by patient: Thread/unthread right sleeve, Thread/unthread left sleeve, Put head through opening, Pull shirt over trunk          Upper body assist Assist Level: More than reasonable time   Set up : To obtain clothing/put away  Lower Body Dressing/Undressing Lower body dressing   What is the patient wearing?: Shoes, Socks, Pants, Underwear Underwear - Performed by patient: Thread/unthread right underwear leg, Thread/unthread left underwear leg Underwear - Performed by helper: Pull underwear up/down Pants- Performed by patient: Thread/unthread right pants leg, Thread/unthread left pants leg Pants- Performed  by helper: Pull pants up/down Non-skid slipper socks- Performed by patient: Don/doff left sock Non-skid slipper socks- Performed by helper: Don/doff right  sock, Don/doff left sock Socks - Performed by patient: Don/doff right sock, Don/doff left sock Socks - Performed by helper: Don/doff right sock, Don/doff left sock Shoes - Performed by patient: Don/doff right shoe, Don/doff left shoe (Elastic Shoe laces) Shoes - Performed by helper: Don/doff right shoe, Fasten right, Fasten left     TED Hose - Performed by patient: Don/doff right TED hose, Don/doff left TED hose TED Hose - Performed by helper: Don/doff right TED hose, Don/doff left TED hose  Lower body assist Assist for lower body dressing: Touching or steadying assistance (Pt > 75%)      Toileting Toileting   Toileting steps completed by patient: Performs perineal hygiene, Adjust clothing prior to toileting Toileting steps completed by helper: Adjust clothing after toileting Toileting Assistive Devices:  (stedy)  Toileting assist Assist level:  (using stedy)   Transfers Chair/bed transfer   Chair/bed transfer method: Lateral scoot Chair/bed transfer assist level: Touching or steadying assistance (Pt > 75%) Chair/bed transfer assistive device: Sliding board, Armrests Mechanical lift: Ecologist     Max distance: 20 Assist level: 2 helpers   Wheelchair   Type: Manual Max wheelchair distance: 200 Assist Level: No help, No cues, assistive device, takes more than reasonable amount of time  Cognition Comprehension Comprehension assist level: Follows complex conversation/direction with no assist  Expression Expression assist level: Expresses complex ideas: With extra time/assistive device  Social Interaction Social Interaction assist level: Interacts appropriately with others - No medications needed.  Problem Solving Problem solving assist level: Solves complex problems: With extra time  Memory Memory assist level: Complete Independence: No helper   Medical Problem List and Plan: 1. Functional deficits secondary to T5 cord compression/myelopathy from  pathologic compression fracture, metastatic breast cancer  -while there is some mild swelling in her feet, her sensory loss probably accounts more for the "perception" of them feeling swollen---can elevate feet, use TEDS, etc  -Started weaning steroids on 10/20 2. DVT Prophylaxis/Anticoagulation: Pharmaceutical: Lovenox indicated due to immobility/hypercoaguable state 3. Pain Management: . Continue oxycodone prn, robaxin prn.   -Gabapentin 200 BID per pt preference.  Increasing the dose will likely improve the patient's symptoms, however, patient would like to maintain medication at the current dose at present due to concern of side effects. Will reevaluate again and possibly increase medication tomorrow. 4. Adjustment reaction with anxiety/ Mood:   -continue low dose Xanax to help with anxiety attacks.   -Team to provide ego support. Provided patient positive information regarding potential today  -LCSW to follow for evaluation and support.  5. Neuropsych: This patient is capable of making decisions on her own behalf. 6. Skin/Wound Care: Routine pressure relief measures. Maintain adequate nutrition and hydration status. 7. Fluids/Electrolytes/Nutrition: Monitor I/O. Encourage po   -Chem Stable on 10/20 8. ABLA: post op  -pt asked not to be on Fe++ supp  -Repeat Hb, stable on 10/20 9. HTN: Monitor BID. Continue aldactone daily and titrate as needed.  10. Mild chronic gastritis: Continue Pepcid bid.   -bears watching while on decadron, some of chest wall, hypogastric sx may be related to this.  11. Neurogenic bowel and bladder:had large bm on friday.  -voiding fairly well 12. UTI: Ucx +Klebsiella on 10/18. Bactrim started 10/18-10/23.  - Leukocytosis improving 10/20  LOS (Days) 11 A FACE TO FACE EVALUATION WAS PERFORMED  Heather [name] Lorie Phenix  03/01/2015 7:34 AM

## 2015-03-02 ENCOUNTER — Inpatient Hospital Stay (HOSPITAL_COMMUNITY): Payer: Medicare Other

## 2015-03-02 ENCOUNTER — Inpatient Hospital Stay (HOSPITAL_COMMUNITY): Payer: Medicare Other | Admitting: Occupational Therapy

## 2015-03-02 MED ORDER — LIDOCAINE 5 % EX PTCH
2.0000 | MEDICATED_PATCH | CUTANEOUS | Status: DC
  Administered 2015-03-03 – 2015-03-11 (×9): 2 via TRANSDERMAL
  Filled 2015-03-02 (×11): qty 2

## 2015-03-02 MED ORDER — GABAPENTIN 300 MG PO CAPS
300.0000 mg | ORAL_CAPSULE | Freq: Two times a day (BID) | ORAL | Status: DC
  Administered 2015-03-02 – 2015-03-03 (×3): 300 mg via ORAL
  Filled 2015-03-02 (×4): qty 1

## 2015-03-02 MED ORDER — GABAPENTIN 300 MG PO CAPS: 300.0000 mg | ORAL_CAPSULE | Freq: Two times a day (BID) | ORAL | Status: DC

## 2015-03-02 NOTE — Progress Notes (Signed)
Physical Therapy Session Note  Patient Details  Name: Heather [name] MRN: [id number] Date of Birth: Dec 18, 1945  Today's Date: 03/02/2015 PT Individual Time: 0905-0950; 4010-2725 PT Individual Time Calculation (min): 45 min; 65 min  Short Term Goals: Week 2:  PT Short Term Goal 1 (Week 2): = LTGs  Skilled Therapeutic Interventions/Progress Updates:    Session 1: Patient in gym following OT session.  Presents with pain left chest and RN in during session to give medication.  Patient sit to stand in parallel bars min assist.  Able to perform standing weight shifts mod facilitation for hip stability, cues for knee control and assist for COG over BOS.  Performed lateral and diagonal weight shifts focus on stance stability, proprioception and balance.  Patient fatigued and c/o increased left chest pain so transferred to [place] stand step mod/max assist and seated for about 4 minutes at 60 cm/sec stepping.  Patient requested to end session early due to continued pain so assisted to room and mod assist pivot without board chair to bed and for sit to supine lifting both legs.  Patient able to pull self up in bed with railings.  K-pad applied to painful area per pt request and all needs left in reach.  Session 2: Patient propelled wheelchair to gym and returned unaided.  She performed sit to stand to railings in front of 3" steps mod assist due to LE weakness and buckling.  Patient performed standing weight shifts for alternate step taps to step with assist due to poor kinesthetic awareness of stance hip and knee position and difficulty lifting stepping leg to clear step.  Then modified to perform standing alternate steps to side both out and back in with mod facilitation of trunk and hip positioning for balance and stance stability.  Patient transferred to mat via slide board min assist (wheelchair set up only for legrests,) and performed seated trunk/pelvic mobility for ant/post and lateral weight shifts  with facilitation for proper trunk activation for lateral shifts.  Sit to supine on mat mod assist to perform LE therex consisting of hooklying hip flexion, hip adduction with ball between knees, bridging with assist, and lateral trunk rolls with feet on mat initially, then c/o pain, so feet on ball and belt holding legs together.  Patient returned to sitting max assist due to c/o increased pain left chest.  Patient transferred to wheelchair min assist via SBT min assist.  Applied AFO to left LE and patient sit to stand to RW mod assist, then gait x 12' mod assist with left AFO and one LOB to right due to knee buckling with max assist to recover.  Assisted back to bed per pt request mod assist pivot transfer and sit to supine.  Left with table and call bell in reach with K-pad on left chest.  Discussed with RN potential for pain patch to improve pain with local analgesia due to pt fearful of systemic meds depressing her abilities with decreased LE strength noted today.  She reports will discuss with PA.  Therapy Documentation Precautions:  Precautions Precautions: Fall, Back Restrictions Weight Bearing Restrictions: No General: PT Amount of Missed Time (min): 15 Minutes PT Missed Treatment Reason: Pain;Patient fatigue Vital Signs: Pain: Pain Assessment Pain Score: 8  Pain Type: Neuropathic pain Pain Location: Chest Pain Orientation: Left;Anterior Pain Descriptors / Indicators: Aching;Burning Pain Onset: With Activity Pain Intervention(s): Rest;Repositioned;RN made aware   See Function Navigator for Current Functional Status.   Therapy/Group: Individual Therapy  WYNN,CYNDI  Cyndi  [place], [place] 03/02/2015  03/02/2015, 4:06 PM

## 2015-03-02 NOTE — Progress Notes (Signed)
Occupational Therapy Session Note  Patient Details  Name: Heather [name] MRN: [id number] Date of Birth: Nov 28, 1945  Today's Date: 03/02/2015 OT Individual Time: 0730-0900  And 1300-1330 OT Individual Time Calculation (min): 90 min and 30 min    Short Term Goals: Week 2:  OT Short Term Goal 1 (Week 2): Pt will complete 1 step grooming task in standing with steadying assist OT Short Term Goal 2 (Week 2): Pt will complete functional transfers using squat pivot with supervision OT Short Term Goal 3 (Week 2): Pt will manage w/c parts independently in prep for functional tasks OT Short Term Goal 4 (Week 2): Pt will complete toileting task with steadying assist  Skilled Therapeutic Interventions/Progress Updates:    Session One: Pt seen for OT ADL bathing and dressing session. Pt in supine upon arrival, RN administering pain medications. Pt voied desire to complete showering task. She transferred supine> EOB with supervision and completed sliding board transfer to w/c with min A, with 2 LOB episodes posteriorly, requiring min-mod A and VCs to self correct. Stand pivot transfers completed in/ out of shower using grab bars to 3-1 BSC. She bathed seated on 3-1 BSC with supervision, using lateral leans to complete buttock hygiene. She dressed seated in w/c at the sink. Following extended rest break, pt stood with min A and steadying A to pull underwear and pants up, with rest breaks provided btwn. Pt required increased rest breaks btwn standing trials and transfers due to increased pain and fatigue. Increased assist required for footwear due to pain and fatigue. Grooming completed mod I, with pt demonstrating good carry over of education for compensatory strategies for energy conservation taught last week. Pt completed sliding board transfer to [facility] with supervision- min A. Pt able to place sliding board with min VCs for weight shift when completing transfer. Pt self propelled w/c to therapy gym at end of  session, left with hand off to PT. Pt verbalized that over weekend she completed sliding board transfer with family present (no hospital staff), and completed sit <> stand at sink with family only. Educated pt regarding hospital policy, fall risk, and need for assist. Pt verbalized she knew she was suppose to have hospital staff assist, however, did not want to wait. Verbalized understanding of need for assist.   Session Two: Pt sitting in w/c upon arrival, voicing increased chest pain and asking for heating pad. Provided with instant disposable heat. She declined transfers/ standing this session due to pain. Session focused on kitchen mobility from w/c level, accessing various cabinets, both over head and below, and dishwasher. Education provided regarding kitchen set-up, putting commonly used items on counter level, etc. Pt very anxious regarding IADLs upon d/c. Empathetic listening and encouragement provided along with education regarding energy conservation and prioritizing tasks. Pt returned to room at end of session, left sitting in w/c with all needs in reach.   Therapy Documentation Precautions:  Precautions Precautions: Fall, Back Restrictions Weight Bearing Restrictions: No Pain: Pain Assessment Pain Score: 6  Pain Type: Neuropathic pain Pain Location: Chest Pain Orientation: Mid Pain Descriptors / Indicators: Aching;Burning Pain Intervention(s): Repositioned;Ambulation/increased activity;Heat applied  See Function Navigator for Current Functional Status.   Therapy/Group: Individual Therapy  Lewis, [place] Alexa C 03/02/2015, 7:10 AM

## 2015-03-02 NOTE — Progress Notes (Signed)
[facility] PHYSICAL MEDICINE & REHABILITATION     PROGRESS NOTE    Subjective/Complaints: Patient states she slept well overnight, however when she woke up this morning she strained herself attempting to pull herself up in bed. He describes an increase in her nerve pain around her chest. She does note improvement in sleep with muscle relaxer yesterday.  ROS: +nerve pain, Anxiety. Denies CP, SOB, n/v/d.   Objective: Vital Signs: Blood pressure 121/64, pulse 66, temperature 98.4 F (36.9 C), temperature source Oral, resp. rate 18, height '5\' 4"'$  (1.626 m), weight 54.7 kg (120 lb 9.5 oz), SpO2 98 %. No results found. No results for input(s): WBC, HGB, HCT, PLT in the last 72 hours. No results for input(s): NA, K, CL, GLUCOSE, BUN, CREATININE, CALCIUM in the last 72 hours.  Invalid input(s): CO CBG (last 3)  No results for input(s): GLUCAP in the last 72 hours.  Wt Readings from Last 3 Encounters:  02/27/15 54.7 kg (120 lb 9.5 oz)  02/14/15 58.968 kg (130 lb)  02/14/15 58.968 kg (130 lb)    Physical Exam:  Constitutional: She appears well-developed and well-nourished. No distress.  HENT: dentition intact.  Head: Normocephalic and atraumatic.  Mouth/Throat: Oropharynx is clear and moist.  Eyes: Conjunctivae and EOM are normal. Conj WNL Neck: Normal range of motion. Neck supple.  Cardiovascular: Normal rate and regular rhythm.  No murmur heard. Respiratory: Effort normal and breath sounds normal.  GI: Soft. Bowel sounds are normal. She exhibits no distension. There is no tenderness.  Musculoskeletal: She exhibits no tenderness. Trace edema in feet Neurological: She is alert and oriented. Coordination (BLE ataxic) Strength UE grossly 4+/5 with inhibition proximally due to pain. LE: hip flexion 4-/5, ankle dorsiflexion 4-/5, plantar flexion 4+/5 Sensation remains decreased below T5-6,  Skin: Skin is warm and dry. No rash noted. No erythema.  Psych:  anxiety  Assessment/Plan: 1. Functional deficits secondary to T5 Cord compression/myelopathy due compression fx, metastatic breast cancer which require 3+ hours per day of interdisciplinary therapy in a comprehensive inpatient rehab setting. Physiatrist is providing close team supervision and 24 hour management of active medical problems listed below. Physiatrist and rehab team continue to assess barriers to discharge/monitor patient progress toward functional and medical goals.  Function:  Bathing Bathing position   Position: Shower  Bathing parts Body parts bathed by patient: Right arm, Left arm, Chest, Abdomen, Right upper leg, Left upper leg, Front perineal area, Right lower leg, Left lower leg, Buttocks, Back Body parts bathed by helper: Back  Bathing assist Assist Level: Touching or steadying assistance(Pt > 75%)      Upper Body Dressing/Undressing Upper body dressing   What is the patient wearing?: Bra, Pull over shirt/dress Bra - Perfomed by patient: Hook/unhook bra (pull down sports bra) (Sports bra)   Pull over shirt/dress - Perfomed by patient: Thread/unthread right sleeve, Thread/unthread left sleeve, Put head through opening, Pull shirt over trunk          Upper body assist Assist Level: More than reasonable time   Set up : To obtain clothing/put away  Lower Body Dressing/Undressing Lower body dressing   What is the patient wearing?: Shoes, Socks, Pants, Underwear Underwear - Performed by patient: Thread/unthread right underwear leg, Thread/unthread left underwear leg Underwear - Performed by helper: Pull underwear up/down Pants- Performed by patient: Thread/unthread right pants leg, Thread/unthread left pants leg Pants- Performed by helper: Pull pants up/down Non-skid slipper socks- Performed by patient: Don/doff left sock Non-skid slipper socks- Performed by  helper: Don/doff right sock, Don/doff left sock Socks - Performed by patient: Don/doff right sock,  Don/doff left sock Socks - Performed by helper: Don/doff right sock, Don/doff left sock Shoes - Performed by patient: Don/doff right shoe, Don/doff left shoe (Elastic Shoe laces) Shoes - Performed by helper: Don/doff right shoe, Fasten right, Fasten left     TED Hose - Performed by patient: Don/doff right TED hose, Don/doff left TED hose TED Hose - Performed by helper: Don/doff right TED hose, Don/doff left TED hose  Lower body assist Assist for lower body dressing: Touching or steadying assistance (Pt > 75%)      Toileting Toileting   Toileting steps completed by patient: Performs perineal hygiene, Adjust clothing prior to toileting Toileting steps completed by helper: Adjust clothing after toileting Toileting Assistive Devices:  (pulls up using stedy)  Toileting assist Assist level: Touching or steadying assistance (Pt.75%)   Transfers Chair/bed transfer   Chair/bed transfer method: Lateral scoot Chair/bed transfer assist level: Touching or steadying assistance (Pt > 75%) Chair/bed transfer assistive device: Sliding board, Armrests Mechanical lift: Stedy   Locomotion Ambulation     Max distance: 38 Assist level: 2 helpers   Wheelchair   Type: Manual Max wheelchair distance: 200 Assist Level: No help, No cues, assistive device, takes more than reasonable amount of time  Cognition Comprehension Comprehension assist level: Follows complex conversation/direction with no assist  Expression Expression assist level: Expresses complex ideas: With extra time/assistive device  Social Interaction Social Interaction assist level: Interacts appropriately with others - No medications needed.  Problem Solving Problem solving assist level: Solves complex problems: With extra time  Memory Memory assist level: Complete Independence: No helper   Medical Problem List and Plan: 1. Functional deficits secondary to T5 cord compression/myelopathy from pathologic compression fracture, metastatic  breast cancer  -while there is some mild swelling in her feet, her sensory loss probably accounts more for the "perception" of them feeling swollen---can elevate feet, use TEDS, etc  -Started weaning steroids on 10/20 2. DVT Prophylaxis/Anticoagulation: Pharmaceutical: Lovenox indicated due to immobility/hypercoaguable state 3. Pain Management: . Continue oxycodone prn, robaxin prn.   -After several lengthy discussions with the patient, increased gabapentin to 300 twice a day today. Continue to monitor and again if necessary.  -Continued to encourage patient to use muscle relaxants as necessary. 4. Adjustment reaction with anxiety/ Mood:   -continue low dose Xanax to help with anxiety attacks.   -Team to provide ego support. Provided patient positive information regarding potential today  -LCSW to follow for evaluation and support.  5. Neuropsych: This patient is capable of making decisions on her own behalf. 6. Skin/Wound Care: Routine pressure relief measures. Maintain adequate nutrition and hydration status. 7. Fluids/Electrolytes/Nutrition: Monitor I/O. Encourage po   -Chem Stable on 10/20 8. ABLA: post op  -pt asked not to be on Fe++ supp  -Repeat Hb, stable on 10/20 9. HTN: Monitor BID. Continue aldactone daily and titrate as needed.  10. Mild chronic gastritis: Continue Pepcid bid.   -bears watching while on decadron, some of chest wall, hypogastric sx may be related to this.  11. Neurogenic bowel and bladder:had large bm on friday.  -voiding fairly well 12. UTI: Ucx +Klebsiella on 10/18. Bactrim started 10/18-10/23.  - Leukocytosis improving 10/20  LOS (Days) 12 A FACE TO FACE EVALUATION WAS PERFORMED  Kurtiss Wence Lorie Phenix 03/02/2015 7:57 AM

## 2015-03-03 ENCOUNTER — Inpatient Hospital Stay (HOSPITAL_COMMUNITY): Payer: Medicare Other

## 2015-03-03 ENCOUNTER — Inpatient Hospital Stay (HOSPITAL_COMMUNITY): Payer: Medicare Other | Admitting: Occupational Therapy

## 2015-03-03 MED ORDER — GABAPENTIN 400 MG PO CAPS
400.0000 mg | ORAL_CAPSULE | Freq: Two times a day (BID) | ORAL | Status: DC
  Administered 2015-03-03 – 2015-03-05 (×4): 400 mg via ORAL
  Filled 2015-03-03 (×4): qty 1

## 2015-03-03 NOTE — Progress Notes (Signed)
Occupational Therapy Session Note  Patient Details  Name: Heather [name] MRN: [id number] Date of Birth: 12-Sep-1945  Today's Date: 03/03/2015 OT Individual Time: 0900-1000 and 1100-1200 OT Individual Time Calculation (min): 60 min and 60 min   Short Term Goals: Week 2:  OT Short Term Goal 1 (Week 2): Pt will complete 1 step grooming task in standing with steadying assist OT Short Term Goal 2 (Week 2): Pt will complete functional transfers using squat pivot with supervision OT Short Term Goal 3 (Week 2): Pt will manage w/c parts independently in prep for functional tasks OT Short Term Goal 4 (Week 2): Pt will complete toileting task with steadying assist  Skilled Therapeutic Interventions/Progress Updates:    Session One: Pt seen for OT therapy session focusing on functional standing balance and transfers. Pt in w/c upon arrival, voicing desire to wait until next session to complete bathing/dressing task. Pt self propelled w/c to therapy gym. She completed sit <> stand at parallel bars with emphasis on functional standing balance in prep for standing LB dressing task. Min steadying assist provided while pt removed clothes pins from shirt with emphasis on functional standing balance. In ADL apartment, pt completed sliding board transfers w/c<> EOB,  And EOB <> drop arm BSC, requiring supervision and VCs for sliding board technique. Recommend pt using BSC at bedside for night time toileting needs. Extensive education/ discussion regarding home layout and recommendations for bathing/dressing tasks. Pt will have husband take pictures and measurements for w/c accessibility in bathroom and kitchen. Educated pt regarding recommendation for limited functional standing tasks (i.e. Lower body dressing only). Pt educated regarding lateral leans, however, pt limited due to pain and fatigue during lateral leans. Pt returned to room at end of session, left sitting up in w/c, all needs in reach.   Session Two:  Pt seen for OT ADL bathing and dressing session. Pt sitting up in w/c upon arrival, voicing desire to complete showering task. Squat pivot transfers completed into/out of shower to 3-1 BSC. She bathd with supervision and min steadying assist while performing alteral leans to complete buttock hygiene. Dressing and grooming completed seated in w/c at sink. Following extended rest break, she stood at sink with steadying assist provided while pt pulled up pants, requiring VCs for weight shift in standing. She completed squat pivot transfer to recliner with min A. Pt left sitting in recliner at end of session, all needs in reach.  Pt educated throughout session regarding energy conservation, prioritizing tasks, and d/c planning.   Therapy Documentation Precautions:  Precautions Precautions: Fall, Back Restrictions Weight Bearing Restrictions: No Pain: Pain Assessment Pain Score: 5  Pain Type: Neuropathic pain Pain Location: Chest Pain Orientation: Mid Pain Descriptors / Indicators: Burning Pain Intervention(s): Repositioned;Ambulation/increased activity;Shower  See Function Navigator for Current Functional Status.   Therapy/Group: Individual Therapy  Lewis, Juneau Doughman C 03/03/2015, 7:12 AM

## 2015-03-03 NOTE — Progress Notes (Signed)
Physical Therapy Session Note  Patient Details  Name: Heather [name] MRN: [id number] Date of Birth: 08/25/1945  Today's Date: 03/03/2015 PT Individual Time: 1430-1535 PT Individual Time Calculation (min): 65 min   Short Term Goals: Week 2:  PT Short Term Goal 1 (Week 2): = LTGs  Skilled Therapeutic Interventions/Progress Updates:   Session focused on addressing functional bed mobility, squat pivot transfers with focus on technique and foot placement, Nustep for neuro re-ed to BLE in closed chain and for reciprocal movement pattern (used gait belt to maintain leg positioning and tactile cues by therapist) x 8 min on level 3, education and discussion about potential to plan for outing next week to begin community reintegration and education on process of family education, and neuro re-ed for sit to stands, standing balance, postural control re-training, and pre-gait activities with RW, L AFO, and 3# ankle weights but due to decreased strength and balance this PM, deferred gait trials. Pt required min to mod assist to maintain static balance and mod to max assist when attempting dynamic movements.   Therapy Documentation Precautions:  Precautions Precautions: Fall, Back Restrictions Weight Bearing Restrictions: No    Pain:  Unrated pain in chest area described as burning which is what pt has been experiencing. Pt reports it was so bad earlier she had to go back to bed.  See Function Navigator for Current Functional Status.   Therapy/Group: Individual Therapy  [name], PT, DPT  03/03/2015, 3:52 PM

## 2015-03-03 NOTE — Plan of Care (Signed)
Goals modified to supervision- min A level for functional transfers. Pt now using sliding board for most functional transfers. See POC for goal revisions. - Alexandre Lightsey Lewis, OTR/L

## 2015-03-03 NOTE — Plan of Care (Signed)
Downgraded basic transfer goal to supervision level for safety and bed mobility to min A due to assist with lower extremity management. See plan of care for details.

## 2015-03-03 NOTE — Progress Notes (Signed)
Physical Therapy Session Note  Patient Details  Name: Heather [name] MRN: [id number] Date of Birth: 03-Sep-1945  Today's Date: 03/03/2015 PT Individual Time: 0805-0900 PT Individual Time Calculation (min): 55 min   Short Term Goals: Week 2:  PT Short Term Goal 1 (Week 2): = LTGs  Skilled Therapeutic Interventions/Progress Updates:    Pt reports she had a rough day yesterday due to increased pain. Feeling a bit better today and reports she had a stronger dose of medicine this morning. Focued on neuro re-ed for BLE weightbearing for increased proprioceptive input, postural control, weightshifting, and balance in standing frame x 15 min progressing from static to reaching task and then loosening support of sling to increase pt need for motor activation. As fatigued, L knee with some buckling and increased pain in low back but resolved with seated rest break. Trial with L AFO for sit to stand and pre-gait (stepping forward and back x 3') with focus on weightshifting, knee control in stance, and pelvic alignment with min to mod assist. Pt reports feeling legs are getting heavier and difficult to manage. Discussed trial with leg lifter to help with bed mobility in future session or pt may benefit from leg loops. Demonstrated technique with patient.   Therapy Documentation Precautions:  Precautions Precautions: Fall, Back Restrictions Weight Bearing Restrictions: No  Pain: C/o pain in chest/nerve pain. Medication given and she reports talking with MD about changing the meds.   See Function Navigator for Current Functional Status.   Therapy/Group: Individual Therapy  [name], PT, DPT  03/03/2015, 9:08 AM

## 2015-03-03 NOTE — Progress Notes (Signed)
[facility] PHYSICAL MEDICINE & REHABILITATION     PROGRESS NOTE    Subjective/Complaints: Patient seen and examined this morning lying in bed. She states her pain is better but it is still present. She would like to increase her neuropathic pain medication again today, so she is better able to participate in therapies.  ROS: +nerve pain, Anxiety. Denies CP, SOB, n/v/d.   Objective: Vital Signs: Blood pressure 135/61, pulse 67, temperature 97.5 F (36.4 C), temperature source Oral, resp. rate 20, height '5\' 4"'$  (1.626 m), weight 54.7 kg (120 lb 9.5 oz), SpO2 97 %. No results found. No results for input(s): WBC, HGB, HCT, PLT in the last 72 hours. No results for input(s): NA, K, CL, GLUCOSE, BUN, CREATININE, CALCIUM in the last 72 hours.  Invalid input(s): CO CBG (last 3)  No results for input(s): GLUCAP in the last 72 hours.  Wt Readings from Last 3 Encounters:  02/27/15 54.7 kg (120 lb 9.5 oz)  02/14/15 58.968 kg (130 lb)  02/14/15 58.968 kg (130 lb)    Physical Exam:  Constitutional: She appears well-developed and well-nourished. No distress.  HENT: dentition intact.  Head: Normocephalic and atraumatic.  Mouth/Throat: Oropharynx is clear and moist.  Eyes: Conjunctivae and EOM are normal. Conj WNL Neck: Normal range of motion. Neck supple.  Cardiovascular: Normal rate and regular rhythm.  No murmur heard. Respiratory: Effort normal and breath sounds normal.  GI: Soft. Bowel sounds are normal. She exhibits no distension. There is no tenderness.  Musculoskeletal: She exhibits no tenderness. Trace edema in feet Neurological: She is alert and oriented. Coordination (BLE ataxic) Strength UE grossly 4+/5 with inhibition proximally due to pain. LE: hip flexion 4-/5, ankle dorsiflexion 4-/5, plantar flexion 4+/5 Sensation remains decreased below T5-6,  Skin: Skin is warm and dry. No rash noted. No erythema.  Psych: anxiety  Assessment/Plan: 1. Functional deficits secondary to  T5 Cord compression/myelopathy due compression fx, metastatic breast cancer which require 3+ hours per day of interdisciplinary therapy in a comprehensive inpatient rehab setting. Physiatrist is providing close team supervision and 24 hour management of active medical problems listed below. Physiatrist and rehab team continue to assess barriers to discharge/monitor patient progress toward functional and medical goals.  Function:  Bathing Bathing position   Position: Shower  Bathing parts Body parts bathed by patient: Right arm, Left arm, Chest, Abdomen, Right upper leg, Left upper leg, Front perineal area, Right lower leg, Left lower leg, Buttocks Body parts bathed by helper: Back  Bathing assist Assist Level: Supervision or verbal cues      Upper Body Dressing/Undressing Upper body dressing   What is the patient wearing?: Bra, Pull over shirt/dress Bra - Perfomed by patient: Thread/unthread right bra strap, Thread/unthread left bra strap, Hook/unhook bra (pull down sports bra)   Pull over shirt/dress - Perfomed by patient: Thread/unthread right sleeve, Thread/unthread left sleeve, Put head through opening, Pull shirt over trunk          Upper body assist Assist Level: More than reasonable time   Set up : To obtain clothing/put away  Lower Body Dressing/Undressing Lower body dressing   What is the patient wearing?: Shoes, Pants, Underwear, [employer]  - Performed by patient: Thread/unthread right underwear leg, Thread/unthread left underwear leg Underwear - Performed by helper: Pull underwear up/down Pants- Performed by patient: Thread/unthread right pants leg, Thread/unthread left pants leg, Pull pants up/down Pants- Performed by helper: Pull pants up/down Non-skid slipper socks- Performed by patient: Don/doff left sock Non-skid slipper  socks- Performed by helper: Don/doff right sock, Don/doff left sock Socks - Performed by patient: Don/doff right sock, Don/doff left  sock Socks - Performed by helper: Don/doff right sock, Don/doff left sock Shoes - Performed by patient: Don/doff right shoe, Don/doff left shoe Shoes - Performed by helper: Don/doff right shoe, Fasten right, Fasten left     TED Hose - Performed by patient: Don/doff right TED hose, Don/doff left TED hose TED Hose - Performed by helper: Don/doff right TED hose, Don/doff left TED hose  Lower body assist Assist for lower body dressing: Touching or steadying assistance (Pt > 75%)      Toileting Toileting   Toileting steps completed by patient: Performs perineal hygiene, Adjust clothing prior to toileting Toileting steps completed by helper: Adjust clothing after toileting Toileting Assistive Devices:  (pulls up using stedy)  Toileting assist Assist level: Touching or steadying assistance (Pt.75%)   Transfers Chair/bed transfer   Chair/bed transfer method: Lateral scoot Chair/bed transfer assist level: Moderate assist (Pt 50 - 74%/lift or lower) (min A slide board, mod A squat pivot) Chair/bed transfer assistive device: Sliding board, Armrests Mechanical lift: Stedy   Locomotion Ambulation     Max distance: 12 Assist level: Moderate assist (Pt 50 - 74%)   Wheelchair   Type: Manual Max wheelchair distance: 200 Assist Level: No help, No cues, assistive device, takes more than reasonable amount of time  Cognition Comprehension Comprehension assist level: Follows complex conversation/direction with no assist  Expression Expression assist level: Expresses complex ideas: With extra time/assistive device  Social Interaction Social Interaction assist level: Interacts appropriately with others - No medications needed.  Problem Solving Problem solving assist level: Solves complex problems: With extra time  Memory Memory assist level: Complete Independence: No helper   Medical Problem List and Plan: 1. Functional deficits secondary to T5 cord compression/myelopathy from pathologic  compression fracture, metastatic breast cancer  -while there is some mild swelling in her feet, her sensory loss probably accounts more for the "perception" of them feeling swollen---can elevate feet, use TEDS, etc  -Started weaning steroids on 10/20 2. DVT Prophylaxis/Anticoagulation: Pharmaceutical: Lovenox indicated due to immobility/hypercoaguable state 3. Pain Management: . Continue oxycodone prn, robaxin prn.   -After several lengthy discussions with the patient, increased gabapentin again to 400 BID on 10/25. Continue to monitor and again if necessary.  -Continued to encourage patient to use muscle relaxants as necessary, which she is more willing to take now. 4. Adjustment reaction with anxiety/ Mood:   -continue low dose Xanax to help with anxiety attacks.   -Team to provide ego support. Provided patient positive information regarding potential today  -LCSW to follow for evaluation and support.  5. Neuropsych: This patient is capable of making decisions on her own behalf. 6. Skin/Wound Care: Routine pressure relief measures. Maintain adequate nutrition and hydration status. 7. Fluids/Electrolytes/Nutrition: Monitor I/O. Encourage po   -Chem Stable on 10/20 8. ABLA: post op  -pt asked not to be on Fe++ supp  -Repeat Hb, stable on 10/20 9. HTN: Monitor BID. Continue aldactone daily and titrate as needed.  10. Mild chronic gastritis: Continue Pepcid bid.   -bears watching while on decadron, some of chest wall, hypogastric sx may be related to this.  11. Neurogenic bowel and bladder:had large bm on friday.  -voiding fairly well 12. UTI: Ucx +Klebsiella on 10/18. Bactrim started 10/18-10/23.  - Leukocytosis improving 10/20  LOS (Days) 13 A FACE TO FACE EVALUATION WAS PERFORMED  [name] 03/03/2015 8:21 AM

## 2015-03-04 ENCOUNTER — Inpatient Hospital Stay (HOSPITAL_COMMUNITY): Payer: Medicare Other | Admitting: Occupational Therapy

## 2015-03-04 ENCOUNTER — Inpatient Hospital Stay (HOSPITAL_COMMUNITY): Payer: Medicare Other | Admitting: Physical Therapy

## 2015-03-04 ENCOUNTER — Encounter (HOSPITAL_COMMUNITY): Payer: Medicare Other | Admitting: Occupational Therapy

## 2015-03-04 MED ORDER — DEXAMETHASONE 4 MG PO TABS
4.0000 mg | ORAL_TABLET | Freq: Two times a day (BID) | ORAL | Status: DC
  Administered 2015-03-04 – 2015-03-12 (×16): 4 mg via ORAL
  Filled 2015-03-04 (×16): qty 1

## 2015-03-04 NOTE — Progress Notes (Signed)
Occupational Therapy Session Note  Patient Details  Name: Heather [name] MRN: [id number] Date of Birth: 1945/06/02  Today's Date: 03/04/2015 OT Individual Time: 1300-1358 OT Individual Time Calculation (min): 58 min    Skilled Therapeutic Interventions/Progress Updates:    Pt rolled her wheelchair to and from the therapy gym with modified independence.  She was able to transfer squat pivot to therapy mat for work on trunk strengthening with min assist, including wheelchair setup and removal of footrests.  Worked on dynamic trunk control and maintaining upright posture with anterior pelvic tilt while engaged in Wii activity.  Pt needs min facilitation or use of her UEs to maintain anterior pelvic tilt with neutral lumbar extension.  Focused session on maintaining posture while relying mostly on back extensors and not UEs.  Pt able to maintain for short periods of 10-15 seconds.  Finished session with transfer back to wheelchair with min assist.  Pt needing min questioning cues for locking brakes and removing arm rest on the left side before attempting transfer.   Therapy Documentation Precautions:  Precautions Precautions: Fall, Back Precaution Booklet Issued: No Restrictions Weight Bearing Restrictions: No  Pain: Pain Assessment Pain Assessment: Faces Pain Score: 2  Faces Pain Scale: Hurts a little bit Pain Type: Neuropathic pain Pain Location: Back Pain Orientation: Mid;Left Pain Descriptors / Indicators: Burning;Aching Pain Frequency: Constant Pain Onset: With Activity Patients Stated Pain Goal: 1 Pain Intervention(s): Repositioned;Emotional support;Medication (See eMAR) ADL: See Function Navigator for Current Functional Status.   Therapy/Group: Individual Therapy  Ova Gillentine OTR/L 03/04/2015, 3:45 PM

## 2015-03-04 NOTE — Progress Notes (Addendum)
Occupational Therapy Session Note  Patient Details  Name: Heather [name] MRN: [id number] Date of Birth: Mar 15, 1946  Today's Date: 03/04/2015 OT Individual Time: 2355-7322 and 1106-1200 OT Individual Time Calculation (min): 60 min and 54 min   Short Term Goals: Week 2:  OT Short Term Goal 1 (Week 2): Pt will complete 1 step grooming task in standing with steadying assist OT Short Term Goal 2 (Week 2): Pt will complete functional transfers using squat pivot with supervision OT Short Term Goal 3 (Week 2): Pt will manage w/[name] parts independently in prep for functional tasks OT Short Term Goal 4 (Week 2): Pt will complete toileting task with steadying assist  Skilled Therapeutic Interventions/Progress Updates:    Session One: Pt seen for OT ADL bathing and dressing session. Pt on toilet upon arrival set-up with STEDY calling for assist. Pt stood at [facility] and performed hygiene with steadying assist. STEDY used to transfer pt to [facility] placed in shower. She required increased assist to bathe today due to extreme "burning" neuropathic pain in L breast. She dressed seated in w/[name], using STEDY to complete sit <> stands to manage LB clothing. Educated pt regarding planning for "good days" and "bad days" upon d/[name].  Pt educated regarding use of leg loops to assist with LE management, pt provided with sample loops to practice management and measurements taken for pt to have leg loops made. She voiced liking the leg loops and agreeing it will give her more control and independence with LE management at d/[name].  Pt left sitting in w/[name] at end of session, all needs in reach.   Session Two: Pt seen for OT therapy session focusing on family training. Pt sitting in w/[name] upon arrival with hand off from PT present and husband/ caregiver present for family training. Pt completed sliding board transfers w/[name] <> bed and bed <> drop arm BSC. Husband assisted with transfers with min A and VCs provided by therapist. Husband will cont  to benefit from hands on family training. Educated regarding use of lateral leans for toileting task and simulated task while seated on BSC, leaning onto bed and use of BSC for night time toileting needs. [name] provided pictures of home bathroom set-up and recommendations made for positioning/ equipment for bathroom home use.  In ADL apartment, pt stood at sink with min steadying assist to simulate standing at home bathroom counter. With VCs, pt's husband demonstrated ability to provide proper assist. Recommend pt only stand for LB clothing management. Discussion regarding home layout and seating surfaces discussed. Cont to recommend only transferring to surfaces that can be accessed with use of sliding board. Pt returned to room at end of session, left sitting in w/[name] with all needs in reach.   Therapy Documentation Precautions:  Precautions Precautions: Fall, Back Restrictions Weight Bearing Restrictions: No Pain: Pain Assessment Pain Score: 7  Pain Type: Neuropathic pain Pain Location: Chest Pain Orientation: Mid Pain Descriptors / Indicators: Burning Pain Intervention(s): Shower;Repositioned  See Function Navigator for Current Functional Status.   Therapy/Group: Individual Therapy  Heather [name] 03/04/2015, 7:12 AM

## 2015-03-04 NOTE — Progress Notes (Signed)
Occupational Therapy Weekly Progress Note  Patient Details  Name: Heather [name] MRN: [id number] Date of Birth: 1946-04-15  Beginning of progress report period: February 26, 2015 End of progress report period: March 05, 2015  Today's Date: 03/05/2015 OT Individual Time: 0830-1000 and 1430-1500 OT Individual Time Calculation (min): 90 min and 30 min   Patient has met 1 of 3 short term goals.  Pt's goals have been modified with emphasis on w/c level tasks. Hands on education/ family training has begun with pt's husband. Pt requiring increased assist due to neuropathic pain, LE weakness, and decreased functional activity tolerance.  Patient continues to demonstrate the following deficits: abnormal posture, acute pain, ataxia, muscle weakness (generalized) and paraparesis at level T-5 and therefore will continue to benefit from skilled OT intervention to enhance overall performance with BADL and Reduce care partner burden.  Patient not progressing toward long term goals.  See goal revision..  Plan of care revisions: See POC for modified goals.  OT Short Term Goals Week 2:  OT Short Term Goal 1 (Week 2): Pt will complete 1 step grooming task in standing with steadying assist OT Short Term Goal 1 - Progress (Week 2): Not met OT Short Term Goal 2 (Week 2): Pt will complete functional transfers using squat pivot with supervision OT Short Term Goal 2 - Progress (Week 2): Not met OT Short Term Goal 3 (Week 2): Pt will manage w/c parts independently in prep for functional tasks OT Short Term Goal 3 - Progress (Week 2): Met OT Short Term Goal 4 (Week 2): Pt will complete toileting task with steadying assist OT Short Term Goal 4 - Progress (Week 2): Partly met Week 3:  OT Short Term Goal 1 (Week 3): STG=LTG due to LOS  Skilled Therapeutic Interventions/Progress Updates:    Session One: Pt seen for OT ADL bathing and dressing session. Pt sitting up in w/c upon arrival, with RN present administering  medications. She completed bathing task seated on 3-1 BSC in shower with supervision, using lateral leans to complete buttock hygiene. She dressed seated in w/c at the sink, obtaining clothes from  [employer]. She completed sit <> stands at sink with min- mod A. Pt requires B UE support for static standing and min-mod steadying assist when pulling up pants with one UE steadying assist. She required rest breaks throughout session in order to complete all tasks.  Pt self propelled w/c to therapy gym. She completed x2 squat pivot transfers in gym with emphasis on clearing butt in simulation of transferring to chair with arm rests. Pt unable to come high enough up to complete functional transfer to standard chair. Pt left sitting in w/c in therapy gym at end of session with hand off to PT. Pt educated throughout session regarding various methods for functional transfers, continuum of care, energy conservation, and d/c planning.   Session Two: Pt seen for OT therapy session focusing on w/c mobility, UE strengthening, and functional activity tolerance. Pt in w/c upon arrival, having just come back from off unit procedure. Pt voicing fatigue for procedure, however, agreeable to tx session. She self propelled w/c throughout unit to off unit solarium with supervision, requiring min A to go up slight carpeted incline. She demonstrated ability to navigate over various terrain including carpeted surfaces and over elevator door jams.  Mod questioning cues required for path finding in return to unit.  Pt left in w/c at end of session, completing oral care mod I.  Pt educated  throughout session regarding w/c management and energy conservation.    Therapy Documentation Precautions:  Precautions Precautions: Fall, Back Precaution Booklet Issued: No Restrictions Weight Bearing Restrictions: No Pain: Pain Assessment Pain Score: Denies pain/ pt reports being pre-medicated.  See Function Navigator for  Current Functional Status.   Therapy/Group: Individual Therapy  Lewis, Sequan Auxier C 03/04/2015, 3:56 PM

## 2015-03-04 NOTE — Progress Notes (Signed)
Physical Therapy Session Note  Patient Details  Name: Heather [name] MRN: [id number] Date of Birth: 10-23-1945  Today's Date: 03/04/2015 PT Individual Time: 1000-1106 PT Individual Time Calculation (min): 66 min   Short Term Goals: Week 2:  PT Short Term Goal 1 (Week 2): = LTGs  Skilled Therapeutic Interventions/Progress Updates:   Pt received in w/c with husband present with drawings of bathroom and measurements.  Discussed bathroom set up, width of doorways and w/c width--w/c width will be no more than 24" wide and should fit through all areas/doorways.  Pt and husband agreeable to practice car and bed transfers.  Pt performed w/c mobility x 150' in controlled environment Mod I and able to set up w/c beside simulated car with extra time.  Placed car at simulated [employer]back height.  Demonstrated to husband where to position himself and how to assist with slideboard transfer w/c <> car as well as w/c parts management, set up and break down of w/c for storage.  Pt and husband gave repeat demonstration with pt requiring min A for slideboard transfer but mod A to place LE into and out of car.  Discussed use of head-hips relationship for efficiency with slideboard transfers.  While pt resting discussed role of f/u therapy, HEP and progression of gait training as appropriate.  Returned to room and set up bed at 26" with HOB elevated, no rails.  Pt enters on L side of bed.  Pt and therapist performed transfer into bed uphill with slideboard with min-mod A.  Also performed with pt bed mobility with use of log rolling with HOB slightly elevated and no rail mod A to bring LE into tall bed and assistance positioning LE to roll to L side during supine > sit.  Pt also required assistance at trunk from side > sit to maintain forwards weight shift.  For transfer back to w/c utilized 6" block under pt's feet to provide more support; pt able to perform slideboard back to w/c min A.  Recommending pt and husband  purchase aerobic step to use during transfers.  Recommending pt and husband practice sequence again before D/C.  Pt handed off to OT at end of session.   Therapy Documentation Precautions:  Precautions Precautions: Fall, Back Restrictions Weight Bearing Restrictions: No Pain:  premedicated; no reports of pain   See Function Navigator for Current Functional Status.   Therapy/Group: Individual Therapy  Raylene Everts [facility] 03/04/2015, 12:37 PM

## 2015-03-04 NOTE — Progress Notes (Signed)
Social Work Patient ID: [name], female   DOB: 1945/11/01, 69 y.o.   MRN: [id number]  Lowella Curb, LCSW Social Worker Signed  Patient Care Conference 03/04/2015  1:10 PM    Expand All Collapse All   Inpatient RehabilitationTeam Conference and Plan of Care Update Date: 03/03/2015   Time: 10:35 AM     Patient Name: Heather [name]      Medical Record Number: [id number]  Date of Birth: 01-27-1946 Sex: Female         Room/Bed: 4M02C/4M02C-01 Payor Info: Payor: MEDICARE / Plan: MEDICARE PART A AND B / Product Type: *No Product type* /    Admitting Diagnosis: decompression , myelopathy    Admit Date/Time:  02/18/2015  5:58 PM Admission Comments: No comment available   Primary Diagnosis:  Paraplegia at T4 level [facility]) Principal Problem: Paraplegia at T4 level [facility])    Patient Active Problem List     Diagnosis  Date Noted   .  Pain     .  Urinary tract infection, site not specified     .  Paraplegia at T4 level ([place])  02/20/2015   .  Neurogenic bowel  02/20/2015   .  Neurogenic bladder  02/20/2015   .  Postoperative anemia due to acute blood loss  02/17/2015   .  Spinal cord compression due to malignant neoplasm metastatic to spine ([place])  02/15/2015   .  Epidural mass  02/15/2015   .  Thoracic spine tumor     .  Pathologic fracture of vertebra  02/14/2015   .  Pathologic fracture of thoracic vertebrae  02/14/2015   .  Breast cancer metastasized to bone ([place])  02/14/2015   .  Hyperlipidemia  02/14/2015   .  H. pylori infection     .  HTN (hypertension)  04/17/2012   .  Primary cancer of upper outer quadrant of right female breast ([place])  03/08/2012     Expected Discharge Date: Expected Discharge Date: 03/12/15  Team Members Present: Physician leading conference: Dr. Delice Lesch Social Worker Present: Lennart Pall, LCSW Nurse Present: Dorien Chihuahua, RN PT Present: Jorge Mandril, PT OT Present: Napoleon Form, OT PPS Coordinator present : Daiva Nakayama, RN, CRRN        Current  Status/Progress  Goal  Weekly Team Focus   Medical     T4 Paraplegia with b/l LE weakness, anxiety, pain  Improve anxiety, pain, wean steroids   Wean steroids, improve pain, improve anxiety    Bowel/Bladder     Continent of bowel and bladder. LBM 03/01/15   Pt to remain continent of bowel and bladder   Monitor   Swallow/Nutrition/ Hydration               ADL's     Min LB dressing and bathing ; Supervision- min sliding board transfers; mod-max toileting;   Supervision- min A overall  Functional transfers; functional standing balance; IADL re-training from w/c level; ADL re-training   Mobility     min assist slideboard transfers; +2 gait and stairs; mod I w/c mobility  supervision w/c level transfers; min A car transfers; mod A gait with PT and +2 for stairs with PT (downgraded due to lack of progress)   family education, neuro re-ed, balance, endurance, strengthening, transfers, standing, gait   Communication               Safety/Cognition/ Behavioral Observations  Pain     Tramadol '50mg'$  q 6hrs, Neurotin '300mg'$  bid  <4  Monitor for effectiveness   Skin     Incisional line with dermabond, OTA, healing appropraitely  No aditional skin breakdown  Monitor    Rehab Goals Patient on target to meet rehab goals: Yes *See Care Plan and progress notes for long and short-term goals.    Barriers to Discharge:  Pain management, anxiety     Possible Resolutions to Barriers:   Adjust pain meds, coping techniques      Discharge Planning/Teaching Needs:   home with husband who can provide 24/7 assistance        Team Discussion:    Pain continues to be an issue.  Pt very reluctant to use meds.  Anxiety continues.  Gait goals d/c'd and planning for supervision with sl.board transfers.  Ramp has been completed.   Revisions to Treatment Plan:    Gait goals d/c'd    Continued Need for Acute Rehabilitation Level of Care: The patient requires daily medical management by a physician  with specialized training in physical medicine and rehabilitation for the following conditions: Daily direction of a multidisciplinary physical rehabilitation program to ensure safe treatment while eliciting the highest outcome that is of practical value to the patient.: Yes Daily medical management of patient stability for increased activity during participation in an intensive rehabilitation regime.: Yes Daily analysis of laboratory values and/or radiology reports with any subsequent need for medication adjustment of medical intervention for : Neurological problems;Other;Post surgical problems  Babita Amaker 03/04/2015, 1:10 PM

## 2015-03-04 NOTE — Progress Notes (Signed)
Social Work Patient ID: Heather [name], female   DOB: 09-29-1945, 69 y.o.   MRN: [id number]   Met yesterday afternoon with pt and spoke with spouse via phone to review team conference.  Both aware that focus is on w/c level activity and transfers.  Pt with questions about DME and f/u services.  Husband to be in today for family education.  Pt calm while talking with me, however, admits that she still becomes anxious very easily in tx sessions.  She denies any concerns about having the necessary assist from husband when she returns home.  Will continue to follow for support and d/c planning needs.  Heather Plant, LCSW

## 2015-03-04 NOTE — Patient Care Conference (Signed)
Inpatient RehabilitationTeam Conference and Plan of Care Update Date: 03/03/2015   Time: 10:35 AM    Patient Name: Heather [name]      Medical Record Number: [id number]  Date of Birth: 17-Jan-1946 Sex: Female         Room/Bed: 4M02C/4M02C-01 Payor Info: Payor: MEDICARE / Plan: MEDICARE PART A AND B / Product Type: *No Product type* /    Admitting Diagnosis: decompression , myelopathy   Admit Date/Time:  02/18/2015  5:58 PM Admission Comments: No comment available   Primary Diagnosis:  Paraplegia at T4 level [facility]) Principal Problem: Paraplegia at T4 level [facility])  Patient Active Problem List   Diagnosis Date Noted  . Pain   . Urinary tract infection, site not specified   . Paraplegia at T4 level ([place]) 02/20/2015  . Neurogenic bowel 02/20/2015  . Neurogenic bladder 02/20/2015  . Postoperative anemia due to acute blood loss 02/17/2015  . Spinal cord compression due to malignant neoplasm metastatic to spine ([place]) 02/15/2015  . Epidural mass 02/15/2015  . Thoracic spine tumor   . Pathologic fracture of vertebra 02/14/2015  . Pathologic fracture of thoracic vertebrae 02/14/2015  . Breast cancer metastasized to bone ([place]) 02/14/2015  . Hyperlipidemia 02/14/2015  . H. pylori infection   . HTN (hypertension) 04/17/2012  . Primary cancer of upper outer quadrant of right female breast ([place]) 03/08/2012    Expected Discharge Date: Expected Discharge Date: 03/12/15  Team Members Present: Physician leading conference: Dr. Delice Lesch Social Worker Present: Lennart Pall, LCSW Nurse Present: Dorien Chihuahua, RN PT Present: Jorge Mandril, PT OT Present: Napoleon Form, OT PPS Coordinator present : Daiva Nakayama, RN, CRRN     Current Status/Progress Goal Weekly Team Focus  Medical   T4 Paraplegia with b/l LE weakness, anxiety, pain  Improve anxiety, pain, wean steroids  Wean steroids, improve pain, improve anxiety   Bowel/Bladder   Continent of bowel and bladder. LBM 03/01/15  Pt to remain  continent of bowel and bladder  Monitor   Swallow/Nutrition/ Hydration             ADL's   Min LB dressing and bathing ; Supervision- min sliding board transfers; mod-max toileting;   Supervision- min A overall  Functional transfers; functional standing balance; IADL re-training from w/c level; ADL re-training   Mobility   min assist slideboard transfers; +2 gait and stairs; mod I w/c mobility  supervision w/c level transfers; min A car transfers; mod A gait with PT and +2 for stairs with PT (downgraded due to lack of progress)  family education, neuro re-ed, balance, endurance, strengthening, transfers, standing, gait   Communication             Safety/Cognition/ Behavioral Observations            Pain   Tramadol '50mg'$  q 6hrs, Neurotin '300mg'$  bid  <4  Monitor for effectiveness   Skin   Incisional line with dermabond, OTA, healing appropraitely  No aditional skin breakdown  Monitor    Rehab Goals Patient on target to meet rehab goals: Yes *See Care Plan and progress notes for long and short-term goals.  Barriers to Discharge: Pain management, anxiety    Possible Resolutions to Barriers:  Adjust pain meds, coping techniques    Discharge Planning/Teaching Needs:  home with husband who can provide 24/7 assistance      Team Discussion:  Pain continues to be an issue.  Pt very reluctant to use meds.  Anxiety continues.  Gait goals d/c'd and  planning for supervision with sl.board transfers.  Ramp has been completed.  Revisions to Treatment Plan:  Gait goals d/c'd   Continued Need for Acute Rehabilitation Level of Care: The patient requires daily medical management by a physician with specialized training in physical medicine and rehabilitation for the following conditions: Daily direction of a multidisciplinary physical rehabilitation program to ensure safe treatment while eliciting the highest outcome that is of practical value to the patient.: Yes Daily medical management of  patient stability for increased activity during participation in an intensive rehabilitation regime.: Yes Daily analysis of laboratory values and/or radiology reports with any subsequent need for medication adjustment of medical intervention for : Neurological problems;Other;Post surgical problems  Rosia Syme 03/04/2015, 1:10 PM

## 2015-03-04 NOTE — Progress Notes (Addendum)
[facility] MEDICINE & REHABILITATION     PROGRESS NOTE    Subjective/Complaints: Patient seen and examined this morning lying in bed. She states she slept well overnight and her pain was stable overnight throughout the day yesterday. However, when she makes certain movements with her upper extremities she does feel a sharp pain. She has several questions about cancer, treatment, radiation, and prognosis.  ROS:  Anxiety. Denies CP, SOB, n/v/d.   Objective: Vital Signs: Blood pressure 119/62, pulse 76, temperature 98.1 F (36.7 C), temperature source Oral, resp. rate 18, height '5\' 4"'$  (1.626 m), weight 54.7 kg (120 lb 9.5 oz), SpO2 98 %. No results found. No results for input(s): WBC, HGB, HCT, PLT in the last 72 hours. No results for input(s): NA, K, CL, GLUCOSE, BUN, CREATININE, CALCIUM in the last 72 hours.  Invalid input(s): CO CBG (last 3)  No results for input(s): GLUCAP in the last 72 hours.  Wt Readings from Last 3 Encounters:  02/27/15 54.7 kg (120 lb 9.5 oz)  02/14/15 58.968 kg (130 lb)  02/14/15 58.968 kg (130 lb)    Physical Exam:  Constitutional: She appears well-developed and well-nourished. No distress.  HENT: dentition intact.  Head: Normocephalic and atraumatic.  Mouth/Throat: Oropharynx is clear and moist.  Eyes: Conjunctivae and EOM are normal. Conj WNL Neck: Normal range of motion. Neck supple.  Cardiovascular: Normal rate and regular rhythm.  No murmur heard. Respiratory: Effort normal and breath sounds normal.  GI: Soft. Bowel sounds are normal. She exhibits no distension. There is no tenderness.  Musculoskeletal: She exhibits no tenderness. Trace edema in feet Neurological: She is alert and oriented. Coordination (BLE ataxic) Strength UE grossly 4+/5 with inhibition proximally due to pain. LE: hip flexion 4-/5, ankle dorsiflexion 4-/5, plantar flexion 4+/5 Sensation remains decreased below T5-6,  Skin: Skin is warm and dry. No rash noted. No  erythema.  Psych: anxiety  Assessment/Plan: 1. Functional deficits secondary to T5 Cord compression/myelopathy due compression fx, metastatic breast cancer which require 3+ hours per day of interdisciplinary therapy in a comprehensive inpatient rehab setting. Physiatrist is providing close team supervision and 24 hour management of active medical problems listed below. Physiatrist and rehab team continue to assess barriers to discharge/monitor patient progress toward functional and medical goals.  Function:  Bathing Bathing position   Position: Shower  Bathing parts Body parts bathed by patient: Right arm, Left arm, Chest, Abdomen, Right upper leg, Left upper leg, Front perineal area, Right lower leg, Left lower leg, Buttocks Body parts bathed by helper: Back  Bathing assist Assist Level: Touching or steadying assistance(Pt > 75%)      Upper Body Dressing/Undressing Upper body dressing   What is the patient wearing?: Bra, Pull over shirt/dress Bra - Perfomed by patient: Hook/unhook bra (pull down sports bra), Thread/unthread right bra strap, Thread/unthread left bra strap   Pull over shirt/dress - Perfomed by patient: Thread/unthread right sleeve, Thread/unthread left sleeve, Put head through opening, Pull shirt over trunk          Upper body assist Assist Level: More than reasonable time   Set up : To obtain clothing/put away  Lower Body Dressing/Undressing Lower body dressing   What is the patient wearing?: Non-skid slipper socks, Pants, Ted Hose, Underwear Underwear - Performed by patient: Thread/unthread right underwear leg, Thread/unthread left underwear leg, Pull underwear up/down Underwear - Performed by helper: Pull underwear up/down Pants- Performed by patient: Thread/unthread right pants leg, Thread/unthread left pants leg, Pull pants up/down Pants-  Performed by helper: Pull pants up/down Non-skid slipper socks- Performed by patient: Don/doff right sock, Don/doff left  sock Non-skid slipper socks- Performed by helper: Don/doff right sock, Don/doff left sock Socks - Performed by patient: Don/doff right sock, Don/doff left sock Socks - Performed by helper: Don/doff right sock, Don/doff left sock Shoes - Performed by patient: Don/doff right shoe, Don/doff left shoe Shoes - Performed by helper: Don/doff right shoe, Fasten right, Fasten left     TED Hose - Performed by patient: Don/doff right TED hose, Don/doff left TED hose TED Hose - Performed by helper: Don/doff right TED hose, Don/doff left TED hose  Lower body assist Assist for lower body dressing: Touching or steadying assistance (Pt > 75%)      Toileting Toileting   Toileting steps completed by patient: Performs perineal hygiene, Adjust clothing prior to toileting Toileting steps completed by helper: Adjust clothing after toileting Toileting Assistive Devices:  (pulls up using stedy)  Toileting assist Assist level: Touching or steadying assistance (Pt.75%)   Transfers Chair/bed transfer   Chair/bed transfer method: Squat pivot Chair/bed transfer assist level: Touching or steadying assistance (Pt > 75%) Chair/bed transfer assistive device: Armrests Mechanical lift: Ecologist     Max distance: 3 Assist level: Moderate assist (Pt 50 - 74%)   Wheelchair   Type: Manual Max wheelchair distance: 150 Assist Level: No help, No cues, assistive device, takes more than reasonable amount of time  Cognition Comprehension Comprehension assist level: Follows complex conversation/direction with no assist  Expression Expression assist level: Expresses complex ideas: With extra time/assistive device  Social Interaction Social Interaction assist level: Interacts appropriately with others - No medications needed.  Problem Solving Problem solving assist level: Solves complex problems: With extra time  Memory Memory assist level: Recognizes or recalls 90% of the time/requires cueing < 10%  of the time   Medical Problem List and Plan: 1. Functional deficits secondary to T5 cord compression/myelopathy from pathologic compression fracture, metastatic breast cancer  -while there is some mild swelling in her feet, her sensory loss probably accounts more for the "perception" of them feeling swollen---can elevate feet, use TEDS, etc  -Started weaning steroids on 10/20 2. DVT Prophylaxis/Anticoagulation: Pharmaceutical: Lovenox indicated due to immobility/hypercoaguable state 3. Pain Management: . Continue oxycodone prn, robaxin prn.   -After several lengthy discussions with the patient, increased gabapentin again to 400 BID on 10/25. Continue to monitor and increase again if necessary.  -Continued to encourage patient to use muscle relaxants as necessary, which she is more willing to take now. 4. Adjustment reaction with anxiety/ Mood:   -continue low dose Xanax to help with anxiety attacks.   -Team to provide ego support. Provided patient positive information regarding potential today  -LCSW to follow for evaluation and support.  5. Neuropsych: This patient is capable of making decisions on her own behalf. 6. Skin/Wound Care: Routine pressure relief measures. Maintain adequate nutrition and hydration status. 7. Fluids/Electrolytes/Nutrition: Monitor I/O. Encourage po   -Chem Stable on 10/20  -Eating 80-100% of meals 8. ABLA: post op  -pt asked not to be on Fe++ supp  -Repeat Hb, stable on 10/20 9. HTN: Monitor BID. Continue aldactone daily and titrate as needed.   -119/63 this AM 10. Mild chronic gastritis: Continue Pepcid bid.   -bears watching while on decadron, some of chest wall, hypogastric sx may be related to this.  11. Neurogenic bowel and bladder:had large bm on friday.  -voiding fairly well 12. UTI: Ucx +Klebsiella  on 10/18. Bactrim started 10/18-10/23.  - Leukocytosis improving 10/20  Total visit time: 35 minutes.  Counseling time: 25 minutes  LOS (Days)  14 A FACE TO FACE EVALUATION WAS PERFORMED  Zana Biancardi Lorie Phenix 03/04/2015 8:15 AM

## 2015-03-05 ENCOUNTER — Inpatient Hospital Stay (HOSPITAL_COMMUNITY): Payer: Medicare Other | Admitting: Occupational Therapy

## 2015-03-05 ENCOUNTER — Inpatient Hospital Stay (HOSPITAL_COMMUNITY): Payer: Medicare Other

## 2015-03-05 ENCOUNTER — Inpatient Hospital Stay (HOSPITAL_COMMUNITY): Payer: Medicare Other | Admitting: Physical Therapy

## 2015-03-05 MED ORDER — GABAPENTIN 400 MG PO CAPS
400.0000 mg | ORAL_CAPSULE | Freq: Three times a day (TID) | ORAL | Status: DC
  Administered 2015-03-05 – 2015-03-12 (×21): 400 mg via ORAL
  Filled 2015-03-05 (×21): qty 1

## 2015-03-05 MED ORDER — GADOBENATE DIMEGLUMINE 529 MG/ML IV SOLN
10.0000 mL | Freq: Once | INTRAVENOUS | Status: AC
  Administered 2015-03-05: 10 mL via INTRAVENOUS

## 2015-03-05 NOTE — Progress Notes (Signed)
Physical Therapy Session Note  Patient Details  Name: Heather [name] MRN: [id number] Date of Birth: 1945/10/28  Today's Date: 03/05/2015 PT Individual Time: 1000-1100 PT Individual Time Calculation (min): 60 min   Short Term Goals: Week 2:  PT Short Term Goal 1 (Week 2): = LTGs  Skilled Therapeutic Interventions/Progress Updates:    Session focused on family education with pt's husband in regards to bed mobility (trial of technique with HOB elevated (due to on mat, used wedge to simulate) due to pain when laying flat), slideboard transfers with focus on technique and communication between pt and husband (handout given to help with sequencing and recall of steps per pt request), and education on DME/family education with plan for real car transfer tomorrow (notified scheduling team).  End of session focused on neuro re-ed for gait training in [facility] with mod A from therapist with pt partially unweighted using 3# ankle weights on BLE with focus on postural control, foot placement, weightshifting, and sequencing. Pt expressed that she wants her husband to really understand how hard mobility is for her right now. Reinforced that this is why we are doing family education.   Therapy Documentation Precautions:  Precautions Precautions: Fall, Back Precaution Booklet Issued: No Restrictions Weight Bearing Restrictions: No Pain:  Reports ongoing chest pain - premedicated.   See Function Navigator for Current Functional Status.   Therapy/Group: Individual Therapy  [name], PT, DPT  03/05/2015, 12:13 PM

## 2015-03-05 NOTE — Progress Notes (Signed)
Physical Therapy Session Note  Patient Details  Name: Heather [name] MRN: [id number] Date of Birth: 05/22/1945  Today's Date: 03/05/2015 PT Individual Time: 1500-1600 PT Individual Time Calculation (min): 60 min   Short Term Goals: Week 2:  PT Short Term Goal 1 (Week 2): = LTGs  Skilled Therapeutic Interventions/Progress Updates:   Patient received in wheelchair, reporting back pain and "trauma" from MRI and having to lay supine for 1 hour. With 3# ankle weights, performed BLE therex for strengthening and NMR: standing heel raises in parallel bars x 10, seated LAQ x 20 each LE, seated alt hip flexion to fatigue, seated resisted hamstring curls x 15, seated hip abduction squeezing ball between knees with 3-5 sec hold x 15, seated resisted hip abduction with 3 sec hold x 15. Instructed in wheelchair push ups with patient progressing to sit <> stand using parallel bars for UE support to fatigue with focus on weight shifting, hip hinge, knee flexion, and eccentric control. Patient set up wheelchair and placed slide board for transfer back to bed to simulate home environment with supervision and required min A for actual transfer, mod A for sit > supine with HOB raised lifting BLE to maintain precautions. Patient able to reposition self higher in bed with use of rails and therapist stabilizing BLE in hooklying to push through legs. Patient left semi reclined in bed with all needs within reach and visitor present.   Therapy Documentation Precautions:  Precautions Precautions: Fall, Back Precaution Booklet Issued: No Restrictions Weight Bearing Restrictions: No Pain:  unrated back and chest pain due to MRI in supine prior to session, premedicated  See Function Navigator for Current Functional Status.   Therapy/Group: Individual Therapy  Laretta Alstrom 03/05/2015, 5:16 PM

## 2015-03-05 NOTE — Progress Notes (Addendum)
[facility] PHYSICAL MEDICINE & REHABILITATION     PROGRESS NOTE    Subjective/Complaints: Pt states that left chest pain continues to bother her. She also feels weaker in the lower extremities  ROS: Pt denies fever, rash/itching, headache, blurred or double vision, nausea, vomiting, abdominal pain, diarrhea, chest pain, shortness of breath, palpitations, dysuria, dizziness, neck or back pain, bleeding, +anxiety    Objective: Vital Signs: Blood pressure 121/60, pulse 79, temperature 97.8 F (36.6 C), temperature source Oral, resp. rate 18, height '5\' 4"'$  (1.626 m), weight 54.7 kg (120 lb 9.5 oz), SpO2 97 %. No results found. No results for input(s): WBC, HGB, HCT, PLT in the last 72 hours. No results for input(s): NA, K, CL, GLUCOSE, BUN, CREATININE, CALCIUM in the last 72 hours.  Invalid input(s): CO CBG (last 3)  No results for input(s): GLUCAP in the last 72 hours.  Wt Readings from Last 3 Encounters:  02/27/15 54.7 kg (120 lb 9.5 oz)  02/14/15 58.968 kg (130 lb)  02/14/15 58.968 kg (130 lb)    Physical Exam:  Constitutional: She appears well-developed and well-nourished. No distress.  HENT: dentition intact.  Head: Normocephalic and atraumatic.  Mouth/Throat: Oropharynx is clear and moist.  Eyes: Conjunctivae and EOM are normal. Conj WNL Neck: Normal range of motion. Neck supple.  Cardiovascular: Normal rate and regular rhythm.  No murmur heard. Respiratory: Effort normal and breath sounds normal.  GI: Soft. Bowel sounds are normal. She exhibits no distension. There is no tenderness.  Musculoskeletal: She exhibits no tenderness. Trace edema in feet Neurological: She is alert and oriented. Coordination (BLE ataxic) Strength UE grossly 4+/5 with inhibition proximally due to pain. LLE-- hip flexion 3+ to 4-/5, ankle dorsiflexion 4-/5, plantar flexion 4+/5, RLE--- 2/5HF, 2+KE, 3/5 ADF/APF Sensation remains decreased below T5-6,  Skin: Skin is warm and dry. No rash noted.  No erythema.  Psych: anxiety  Assessment/Plan: 1. Functional deficits secondary to T5 Cord compression/myelopathy due compression fx, metastatic breast cancer which require 3+ hours per day of interdisciplinary therapy in a comprehensive inpatient rehab setting. Physiatrist is providing close team supervision and 24 hour management of active medical problems listed below. Physiatrist and rehab team continue to assess barriers to discharge/monitor patient progress toward functional and medical goals.  Function:  Bathing Bathing position   Position: Shower  Bathing parts Body parts bathed by patient: Right arm, Left arm, Chest, Abdomen, Right upper leg, Left upper leg, Front perineal area, Buttocks, Back Body parts bathed by helper: Right lower leg, Left lower leg  Bathing assist Assist Level: Touching or steadying assistance(Pt > 75%)      Upper Body Dressing/Undressing Upper body dressing   What is the patient wearing?: Bra, Pull over shirt/dress Bra - Perfomed by patient: Hook/unhook bra (pull down sports bra), Thread/unthread right bra strap, Thread/unthread left bra strap   Pull over shirt/dress - Perfomed by patient: Thread/unthread right sleeve, Thread/unthread left sleeve, Put head through opening, Pull shirt over trunk          Upper body assist Assist Level: More than reasonable time   Set up : To obtain clothing/put away  Lower Body Dressing/Undressing Lower body dressing   What is the patient wearing?: Non-skid slipper socks, Pants, Ted Hose, Underwear Underwear - Performed by patient: Thread/unthread right underwear leg, Thread/unthread left underwear leg, Pull underwear up/down Underwear - Performed by helper: Pull underwear up/down Pants- Performed by patient: Thread/unthread right pants leg, Thread/unthread left pants leg, Pull pants up/down Pants- Performed by helper:  Pull pants up/down Non-skid slipper socks- Performed by patient: Don/doff right sock, Don/doff  left sock Non-skid slipper socks- Performed by helper: Don/doff right sock, Don/doff left sock Socks - Performed by patient: Don/doff right sock, Don/doff left sock Socks - Performed by helper: Don/doff right sock, Don/doff left sock Shoes - Performed by patient: Don/doff right shoe, Don/doff left shoe Shoes - Performed by helper: Don/doff right shoe, Fasten right, Fasten left     TED Hose - Performed by patient: Don/doff right TED hose, Don/doff left TED hose TED Hose - Performed by helper: Don/doff right TED hose, Don/doff left TED hose  Lower body assist Assist for lower body dressing: Touching or steadying assistance (Pt > 75%)      Toileting Toileting   Toileting steps completed by patient: Performs perineal hygiene Toileting steps completed by helper: Adjust clothing prior to toileting, Adjust clothing after toileting Toileting Assistive Devices: Other (comment) (steady )  Toileting assist Assist level: Touching or steadying assistance (Pt.75%)   Transfers Chair/bed transfer   Chair/bed transfer method: Lateral scoot Chair/bed transfer assist level: Touching or steadying assistance (Pt > 75%) Chair/bed transfer assistive device: Other (Stedy) Mechanical lift: Ecologist     Max distance: 3 Assist level: Moderate assist (Pt 50 - 74%)   Wheelchair   Type: Manual Max wheelchair distance: 150 Assist Level: No help, No cues, assistive device, takes more than reasonable amount of time  Cognition Comprehension Comprehension assist level: Follows complex conversation/direction with no assist  Expression Expression assist level: Expresses complex ideas: With extra time/assistive device  Social Interaction Social Interaction assist level: Interacts appropriately with others - No medications needed.  Problem Solving Problem solving assist level: Solves complex 90% of the time/cues < 10% of the time  Memory Memory assist level: Recognizes or recalls 90% of the  time/requires cueing < 10% of the time   Medical Problem List and Plan: 1. Functional deficits secondary to T5 cord compression/myelopathy from pathologic compression fracture, metastatic breast cancer  -increased lower extremity weakness---will follow up MRI of thoracic spine today. Last was from 3 weeks ago  -Started weaning steroids on 10/20  -onc/rad-onc plan 2. DVT Prophylaxis/Anticoagulation: Pharmaceutical: Lovenox indicated due to immobility/hypercoaguable state 3. Pain Management: . Continue oxycodone prn, robaxin prn.   -increase gabapentin to '400mg'$  TID  -robaxin prn 4. Adjustment reaction with anxiety/ Mood:   -continue low dose Xanax to help with anxiety attacks.   -Team to provide ego support.    -LCSW to follow for evaluation and support.  5. Neuropsych: This patient is capable of making decisions on her own behalf. 6. Skin/Wound Care: Routine pressure relief measures. Maintain adequate nutrition and hydration status. 7. Fluids/Electrolytes/Nutrition: Monitor I/O. Encourage po   -recent labwork stable  -Eating 80-100% of meals 8. ABLA: post op  -no Fe++ supp  -Repeat Hb, stable on 10/20 9. HTN: Monitor BID. Continue aldactone daily and titrate as needed.   -121/60 today 10. Mild chronic gastritis: Continue Pepcid bid.   -bears watching while on decadron, some of chest wall, hypogastric sx may be related to this.  11. Neurogenic bowel and bladder:had large bm on friday.  -voiding fairly well 12. UTI: Ucx +Klebsiella on 10/18. Bactrim started 10/18-10/23.  - Leukocytosis improving 10/20     LOS (Days) 15 A FACE TO FACE EVALUATION WAS PERFORMED  Heather [name] T 03/05/2015 9:07 AM

## 2015-03-06 ENCOUNTER — Inpatient Hospital Stay (HOSPITAL_COMMUNITY): Payer: Medicare Other | Admitting: Occupational Therapy

## 2015-03-06 ENCOUNTER — Inpatient Hospital Stay (HOSPITAL_COMMUNITY): Payer: Medicare Other

## 2015-03-06 NOTE — Progress Notes (Signed)
Occupational Therapy Session Note  Patient Details  Name: Heather [name] MRN: [id number] Date of Birth: [birth year]  Today's Date: 03/06/2015 OT Individual Time: 1150-1234 OT Individual Time Calculation (min): 44 min    Short Term Goals: Week 1:  OT Short Term Goal 1 (Week 1): Pt will transfer to [facility] with min A OT Short Term Goal 1 - Progress (Week 1): Progressing toward goal OT Short Term Goal 2 (Week 1): Pt will complete LB dressing task with steadying assist OT Short Term Goal 2 - Progress (Week 1): Progressing toward goal OT Short Term Goal 3 (Week 1): Pt will maintain dynamic standing balance with steadying assist in prep for LB dressing task OT Short Term Goal 3 - Progress (Week 1): Met OT Short Term Goal 4 (Week 1): Pt will display carry over of education in functional tasks with min questioning cues OT Short Term Goal 4 - Progress (Week 1): Met Week 2:  OT Short Term Goal 1 (Week 2): Pt will complete 1 step grooming task in standing with steadying assist OT Short Term Goal 1 - Progress (Week 2): Not met OT Short Term Goal 2 (Week 2): Pt will complete functional transfers using squat pivot with supervision OT Short Term Goal 2 - Progress (Week 2): Not met OT Short Term Goal 3 (Week 2): Pt will manage w/c parts independently in prep for functional tasks OT Short Term Goal 3 - Progress (Week 2): Met OT Short Term Goal 4 (Week 2): Pt will complete toileting task with steadying assist OT Short Term Goal 4 - Progress (Week 2): Partly met Week 3:  OT Short Term Goal 1 (Week 3): STG=LTG due to LOS  Skilled Therapeutic Interventions/Progress Updates:    Pt received in w/c and she stated that she wanted to focus on her sitting balance and UE strength this session. She did not want to use the sliding board for the W/c to mat transfer and opted to work on her squat pivot transfers. Pt was able to pivot to mat with only slight min A. On mat, she worked on various balance exercises of  alternating arm reaches, sliding arm out to side on pillow to facilitate lateral leans, rolling ball forward with BUE from tops of thighs to knees and lifting ball over head with BUE. Pt maintained balance with close S with all exercises.  Pt transferred back to w/c with slightly more A to fully clear hips over w/c cushion. Pt taken back to room and set up with her lunch tray.  Therapy Documentation Precautions:  Precautions Precautions: Fall, Back Precaution Booklet Issued: No Restrictions Weight Bearing Restrictions: No Therapy Vitals Temp: 97.9 F (36.6 C) Temp Source: Oral Pulse Rate: 98 Resp: 18 BP: 127/65 mmHg Patient Position (if appropriate): Sitting Oxygen Therapy SpO2: 98 % O2 Device: Not Delivered Pain: Pain Assessment Pain Assessment: 0-10 Pain Score: 4  Pain Type: Neuropathic pain Pain Location: Chest Pain Orientation: Right;Left;Mid Pain Descriptors / Indicators: Aching Pain Frequency: Constant Pain Onset: With Activity Patients Stated Pain Goal: 1 Pain Intervention(s): Medication (See eMAR) ADL:  See Function Navigator for Current Functional Status.   Therapy/Group: Individual Therapy  Heather [name] 03/06/2015, 1:52 PM

## 2015-03-06 NOTE — Progress Notes (Signed)
Physical Therapy Session Note  Patient Details  Name: Heather [name] MRN: [id number] Date of Birth: 1945/07/25  Today's Date: 03/06/2015 PT Individual Time: 1430-1530 PT Individual Time Calculation (min): 60 min   Short Term Goals: Week 2:  PT Short Term Goal 1 (Week 2): = LTGs  Skilled Therapeutic Interventions/Progress Updates:    Session focused on family education in regards to real car transfer with slideboard, education on DME and how to adjust (pt's personal w/c with cushion in room but still need to apply back), positioning and body mechanics, and d/c planning. Encouraged pt's husband to assist with transfers when he is here this weekend with nursing staff to observe/supervise for safety to increase frequency of practice to prepare for d/c.   Therapy Documentation Precautions:  Precautions Precautions: Fall, Back Precaution Booklet Issued: No Restrictions Weight Bearing Restrictions: No  Pain:  Reports pain currently as ok.   See Function Navigator for Current Functional Status.   Therapy/Group: Individual Therapy  [name], PT, DPT  03/06/2015, 3:45 PM

## 2015-03-06 NOTE — Progress Notes (Signed)
Physical Therapy Session Note  Patient Details  Name: Heather [name] MRN: [id number] Date of Birth: 17-Mar-1946  Today's Date: 03/06/2015 PT Individual Time: 0900-1000 PT Individual Time Calculation (min): 60 min   Short Term Goals: Week 2:  PT Short Term Goal 1 (Week 2): = LTGs  Skilled Therapeutic Interventions/Progress Updates:   Session focused on community mobility with w/c on off elevator, over uneven terrain including incline and ramp, and navigating through thresholds of doorways at mod I on unit and S level in community. Rest breaks due to fatigue. Dynamic sitting balance activity with feet flat on floor for proprioceptive input and back unsupported on back to hand out candy to trick or treaters at mod I level in w/c.  Therapy Documentation Precautions:  Precautions Precautions: Fall, Back Precaution Booklet Issued: No Restrictions Weight Bearing Restrictions: No  Pain: Premedicated for pain in chest area.     See Function Navigator for Current Functional Status.   Therapy/Group: Individual Therapy  [name], PT, DPT  03/06/2015, 9:49 AM

## 2015-03-06 NOTE — Progress Notes (Addendum)
[facility]IC[facility] & REHABILITATION     PROGRESS NOTE    Subjective/Complaints: Was able to tolerate therapies yesterday. Felt that chest wall pain was a little better overnight. Has a lot of questions regarding MRI.  ROS: Pt denies fever, rash/itching, headache, blurred or double vision, nausea, vomiting, abdominal pain, diarrhea, chest pain, shortness of breath, palpitations, dysuria, dizziness, neck or back pain, bleeding, +anxiety    Objective: Vital Signs: Blood pressure 130/70, pulse 76, temperature 98.6 F (37 C), temperature source Oral, resp. rate 18, height '5\' 4"'$  (1.626 m), weight 54.7 kg (120 lb 9.5 oz), SpO2 98 %. Mr Thoracic Spine W Wo Contrast  03/05/2015  CLINICAL DATA:  Weakness in the lower extremities. Functional deficit secondary to T5 cord compression and myelopathy from pathologic compression fracture and metastatic breast cancer. Recent laminectomy. EXAM: MRI THORACIC SPINE WITHOUT AND WITH CONTRAST TECHNIQUE: Multiplanar and multiecho pulse sequences of the thoracic spine, to include the craniocervical junction and cervicothoracic junction, were obtained without and with intravenous contrast. CONTRAST:  29m MULTIHANCE GADOBENATE DIMEGLUMINE 529 MG/ML IV SOLN COMPARISON:  MRI of the thoracic spine 02/14/2015. FINDINGS: Laminectomy is noted at T5. There is posterior spinous stabilization with pedicle screw and rod fixation at T3, T4, and T6 bilaterally. There is residual enhancing tumor posterior to the spinal cord at the T5 level. There some decompression. Cord is still narrowed to 3.5 mm at its minimal diameter. There is extensive tumor extending into the posterior elements bilaterally, right greater than left. No other osseous lesions are present. There is tumor within the foramen on the right at T4-5 and to lesser extent at T5-6. Exaggerated kyphosis is again noted at T5, similar to the prior exam. Slight anterolisthesis of T4 on T6 is noted. The remaining  vertebral body heights and alignment maintained. The foramina are patent throughout the remainder of the thoracic spine. IMPRESSION: 1. T5 laminectomy with partial decompression of the spinal canal. 2. There is residual enhancing tissue posterior to the spinal cord which narrows the cord to 3.5 mm at the level plaque sickle mole compression. 3. Residual enhancing tissue in the right foramen at T4-5 and T5-6 likely representing tumor. 4. Posterior stabilization hardware T3-7. 5. The lower thoracic spine is unremarkable. Electronically Signed   By: CSan MorelleM.D.   On: 03/05/2015 14:10   No results for input(s): WBC, HGB, HCT, PLT in the last 72 hours. No results for input(s): NA, K, CL, GLUCOSE, BUN, CREATININE, CALCIUM in the last 72 hours.  Invalid input(s): CO CBG (last 3)  No results for input(s): GLUCAP in the last 72 hours.  Wt Readings from Last 3 Encounters:  02/27/15 54.7 kg (120 lb 9.5 oz)  02/14/15 58.968 kg (130 lb)  02/14/15 58.968 kg (130 lb)    Physical Exam:  Constitutional: She appears well-developed and well-nourished. No distress.  HENT: dentition intact.  Head: Normocephalic and atraumatic.  Mouth/Throat: Oropharynx is clear and moist.  Eyes: Conjunctivae and EOM are normal. Conj WNL Neck: Normal range of motion. Neck supple.  Cardiovascular: Normal rate and regular rhythm.  No murmur heard. Respiratory: Effort normal and breath sounds normal.  GI: Soft. Bowel sounds are normal. She exhibits no distension. There is no tenderness.  Musculoskeletal: She exhibits no tenderness. Trace edema in feet Neurological: She is alert and oriented. Coordination (BLE ataxic) Strength UE grossly 4+/5 with inhibition proximally due to pain. LLE-- hip flexion 3+ to 4-/5, ankle dorsiflexion 4-/5, plantar flexion 4+/5, RLE--- 2/5HF, 2+KE, 3/5 ADF/APF Sensation  remains decreased below T5-6,  Skin: Skin is warm and dry. No rash noted. No erythema.  Psych:  anxiety  Assessment/Plan: 1. Functional deficits secondary to T5 Cord compression/myelopathy due compression fx, metastatic breast cancer which require 3+ hours per day of interdisciplinary therapy in a comprehensive inpatient rehab setting. Physiatrist is providing close team supervision and 24 hour management of active medical problems listed below. Physiatrist and rehab team continue to assess barriers to discharge/monitor patient progress toward functional and medical goals.  Function:  Bathing Bathing position   Position: Shower  Bathing parts Body parts bathed by patient: Right arm, Left arm, Chest, Abdomen, Right upper leg, Left upper leg, Front perineal area, Buttocks, Back, Right lower leg, Left lower leg Body parts bathed by helper: Right lower leg, Left lower leg  Bathing assist Assist Level: Supervision or verbal cues      Upper Body Dressing/Undressing Upper body dressing   What is the patient wearing?: Bra, Pull over shirt/dress Bra - Perfomed by patient: Hook/unhook bra (pull down sports bra), Thread/unthread right bra strap, Thread/unthread left bra strap   Pull over shirt/dress - Perfomed by patient: Thread/unthread right sleeve, Thread/unthread left sleeve, Put head through opening, Pull shirt over trunk          Upper body assist Assist Level: More than reasonable time   Set up : To obtain clothing/put away  Lower Body Dressing/Undressing Lower body dressing   What is the patient wearing?: Pants, Underwear, [employer], Shoes Underwear - Performed by patient: Thread/unthread right underwear leg, Thread/unthread left underwear leg, Pull underwear up/down Underwear - Performed by helper: Pull underwear up/down Pants- Performed by patient: Thread/unthread right pants leg, Thread/unthread left pants leg Pants- Performed by helper: Pull pants up/down Non-skid slipper socks- Performed by patient: Don/doff right sock, Don/doff left sock Non-skid slipper socks- Performed  by helper: Don/doff right sock, Don/doff left sock Socks - Performed by patient: Don/doff right sock, Don/doff left sock Socks - Performed by helper: Don/doff right sock, Don/doff left sock Shoes - Performed by patient: Don/doff right shoe, Don/doff left shoe Shoes - Performed by helper: Don/doff right shoe, Don/doff left shoe     TED Hose - Performed by patient: Don/doff right TED hose TED Hose - Performed by helper: Don/doff left TED hose  Lower body assist Assist for lower body dressing: Touching or steadying assistance (Pt > 75%)      Toileting Toileting   Toileting steps completed by patient: Performs perineal hygiene Toileting steps completed by helper: Adjust clothing prior to toileting, Adjust clothing after toileting Toileting Assistive Devices: Other (comment) (steady )  Toileting assist Assist level: Touching or steadying assistance (Pt.75%)   Transfers Chair/bed transfer   Chair/bed transfer method: Lateral scoot Chair/bed transfer assist level: Touching or steadying assistance (Pt > 75%) Chair/bed transfer assistive device: Sliding board Mechanical lift: Stedy   Locomotion Ambulation     Max distance: 15 Assist level: Moderate assist (Pt 50 - 74%)   Wheelchair   Type: Manual Max wheelchair distance: 150 Assist Level: No help, No cues, assistive device, takes more than reasonable amount of time  Cognition Comprehension Comprehension assist level: Follows complex conversation/direction with extra time/assistive device  Expression Expression assist level: Expresses complex ideas: With extra time/assistive device  Social Interaction Social Interaction assist level: Interacts appropriately with others - No medications needed.  Problem Solving Problem solving assist level: Solves complex 90% of the time/cues < 10% of the time  Memory Memory assist level: Recognizes or recalls 90% of  the time/requires cueing < 10% of the time   Medical Problem List and Plan: 1.  Functional deficits secondary to T5 cord compression/myelopathy from pathologic compression fracture, metastatic breast cancer  -I personally reviewed the thoracic MRI which shows residual cancer at the T5-6 level affecting cord and likely foramina/nerve roots at this level  -will not further wean steroids given persistence of tumor. Don't believe NS re-consult is required at this point  -reach out to onc/rad-onc re: follow up/plan 2. DVT Prophylaxis/Anticoagulation: Pharmaceutical: Lovenox indicated due to immobility/hypercoaguable state 3. Pain Management: . Continue oxycodone prn, robaxin prn.   -increased gabapentin to '400mg'$  TID with better results  -robaxin prn 4. Adjustment reaction with anxiety/ Mood:   -continue low dose Xanax to help with anxiety attacks.   -Team to provide ego support.     .  5. Neuropsych: This patient is capable of making decisions on her own behalf. 6. Skin/Wound Care: Routine pressure relief measures. Maintain adequate nutrition and hydration status. 7. Fluids/Electrolytes/Nutrition: Monitor I/O. Encourage po   -recent labwork stable  -Eating 80-100% of meals 8. ABLA: post op  -no Fe++ supp  -Repeat Hb, stable on 10/20 9. HTN: Monitor BID. Continue aldactone daily and titrate as needed.   -improved control 10. Mild chronic gastritis: Continue Pepcid bid.   -bears watching while on decadron, some of chest wall, hypogastric sx may be related to this.  11. Neurogenic bowel and bladder: emptying bowels.  -voiding fairly well 12. UTI: Ucx +Klebsiella on 10/18. Bactrim completed        LOS (Days) 16 A FACE TO FACE EVALUATION WAS PERFORMED  Kama Cammarano T 03/06/2015 7:25 AM

## 2015-03-06 NOTE — Progress Notes (Signed)
Occupational Therapy Session Note  Patient Details  Name: Heather [name] MRN: [id number] Date of Birth: 13-Dec-1945  Today's Date: 03/06/2015 OT Individual Time: 0730-0830 and 1300-1330 OT Individual Time Calculation (min): 60 min and 30 min   Short Term Goals: Week 3:  OT Short Term Goal 1 (Week 3): STG=LTG due to LOS  Skilled Therapeutic Interventions/Progress Updates:    Session One: Pt seen for OT ADL bathing and dressing session. Pt in supine upon arrival, agreeable to tx session. She complete sliding board transfer from EOB > w/c with steadying assist and VCs for technique/ sequencing. Stand pivot transfer completed to 3-1 BSC in shower. She bathed seated with supervision, completing lateral leans for buttock hygiene. Blood noted when pt wiped buttock, RN made aware.  She voiced need for toileting task, wanting to use STEDY to transfer to toilet. STEDY used to transfer pt out of shower and to toilet. RN provided assist for hygiene while assessing bleeding. She dressed seated in w/c, using STEDY for sit <> stand to pull pants up. With increased time, pt donned shoes. Pt left sitting in w/c at end of session with RN present prepping for hand off to PT. Pt educated throughout session regarding energy conservation, rate of progression, transfer techniques, and d/c planning.   Session Two: Pt seen for OT therapy session focusing on functional transfers. Pt sitting up in w/c upon arrival, agreeable to tx session. She self propelled w/c throughout unit to public restroom. Session focused on accessing public facilities as she will have to when she goes out for MD appointments/ radiation txs. Pt required min-mod A with squat pivot transfer to toilet with mod questioning cues for w/c parts management including locking breaks. Attempted to use lateral leans for clothing management, however, due to fatigue, pt unable to tolerate leans long enough to get clothing down. With min A pt able to achieve modified  squat to raise butt off toilet seat for assist to be provided for clothing management and for energy conservation. Recommended pt wear dresses when going out in order to ease clothing management for toileting tasks.  Pt returned to room at end of session, left sitting in w/c with all needs in reach.   Therapy Documentation Precautions:  Precautions Precautions: Fall, Back Precaution Booklet Issued: No Restrictions Weight Bearing Restrictions: No Pain: Pain Assessment Pain Assessment: 0-10 Pain Score: 2  Faces Pain Scale: Hurts a little bit Pain Type: Neuropathic pain Pain Location: Chest Pain Orientation: Right;Left Pain Descriptors / Indicators: Aching Pain Frequency: Intermittent Pain Onset: Gradual Pain Intervention(s): RN made aware, shower, repositioned  See Function Navigator for Current Functional Status.   Therapy/Group: Individual Therapy  Lewis, Exodus Kutzer C 03/06/2015, 7:12 AM

## 2015-03-07 ENCOUNTER — Inpatient Hospital Stay (HOSPITAL_COMMUNITY): Payer: Medicare Other | Admitting: Occupational Therapy

## 2015-03-07 NOTE — Progress Notes (Addendum)
Occupational Therapy Session Note  Patient Details  Name: Heather [name] MRN: [id number] Date of Birth: [birth year]  Today's Date: 03/07/2015 OT Individual Time:  -   1740- 8144  (60 min)      Short Term Goals: Week 1:  OT Short Term Goal 1 (Week 1): Pt will transfer to [facility] with min A OT Short Term Goal 1 - Progress (Week 1): Progressing toward goal OT Short Term Goal 2 (Week 1): Pt will complete LB dressing task with steadying assist OT Short Term Goal 2 - Progress (Week 1): Progressing toward goal OT Short Term Goal 3 (Week 1): Pt will maintain dynamic standing balance with steadying assist in prep for LB dressing task OT Short Term Goal 3 - Progress (Week 1): Met OT Short Term Goal 4 (Week 1): Pt will display carry over of education in functional tasks with min questioning cues OT Short Term Goal 4 - Progress (Week 1): Met Week 2:  OT Short Term Goal 1 (Week 2): Pt will complete 1 step grooming task in standing with steadying assist OT Short Term Goal 1 - Progress (Week 2): Not met OT Short Term Goal 2 (Week 2): Pt will complete functional transfers using squat pivot with supervision OT Short Term Goal 2 - Progress (Week 2): Not met OT Short Term Goal 3 (Week 2): Pt will manage w/c parts independently in prep for functional tasks OT Short Term Goal 3 - Progress (Week 2): Met OT Short Term Goal 4 (Week 2): Pt will complete toileting task with steadying assist OT Short Term Goal 4 - Progress (Week 2): Partly met Week 3:  OT Short Term Goal 1 (Week 3): STG=LTG due to LOS  Skilled Therapeutic Interventions/Progress Updates:    Practiced transfers to public bathroom with squat pivot.  Used gait belt for safety.  Instructed husband for key points of control for movement using ischial tuberosities for control of movement Toilet transfers with husband returning demonstration with min cues and assist.  Did sitting unsupported exercises with reaching for 15 minutes with rest breaks in between  each one.  Transferred to nustep with min assist  Used nustep for 5 minutes. At 3 wkload for  325 steps.   Re instructed husband on transfers at end of session with pt/therapist demonstration.  Husband provided more assistance than ppt needed.     Therapy Documentation Precautions:  Precautions Precautions: Fall, Back Precaution Booklet Issued: No Restrictions Weight Bearing Restrictions: No   Pain:  4/10 back             See Function Navigator for Current Functional Status.   Therapy/Group: Individual Therapy  Lisa Roca 03/07/2015, 3:27 PM

## 2015-03-07 NOTE — Progress Notes (Signed)
[place] PHYSICAL MEDICINE & REHABILITATION     PROGRESS NOTE    Subjective/Complaints: Pain seems better. Gabapentin and patches help. Has numerous questions about follow up care  ROS: Pt denies fever, rash/itching, headache, blurred or double vision, nausea, vomiting, abdominal pain, diarrhea, chest pain, shortness of breath, palpitations, dysuria, dizziness, neck or back pain, bleeding, +anxiety    Objective: Vital Signs: Blood pressure 124/66, pulse 74, temperature 97.7 F (36.5 C), temperature source Oral, resp. rate 20, height '5\' 4"'$  (1.626 m), weight 54.7 kg (120 lb 9.5 oz), SpO2 99 %. Mr Thoracic Spine W Wo Contrast  03/05/2015  CLINICAL DATA:  Weakness in the lower extremities. Functional deficit secondary to T5 cord compression and myelopathy from pathologic compression fracture and metastatic breast cancer. Recent laminectomy. EXAM: MRI THORACIC SPINE WITHOUT AND WITH CONTRAST TECHNIQUE: Multiplanar and multiecho pulse sequences of the thoracic spine, to include the craniocervical junction and cervicothoracic junction, were obtained without and with intravenous contrast. CONTRAST:  63m MULTIHANCE GADOBENATE DIMEGLUMINE 529 MG/ML IV SOLN COMPARISON:  MRI of the thoracic spine 02/14/2015. FINDINGS: Laminectomy is noted at T5. There is posterior spinous stabilization with pedicle screw and rod fixation at T3, T4, and T6 bilaterally. There is residual enhancing tumor posterior to the spinal cord at the T5 level. There some decompression. Cord is still narrowed to 3.5 mm at its minimal diameter. There is extensive tumor extending into the posterior elements bilaterally, right greater than left. No other osseous lesions are present. There is tumor within the foramen on the right at T4-5 and to lesser extent at T5-6. Exaggerated kyphosis is again noted at T5, similar to the prior exam. Slight anterolisthesis of T4 on T6 is noted. The remaining vertebral body heights and alignment maintained.  The foramina are patent throughout the remainder of the thoracic spine. IMPRESSION: 1. T5 laminectomy with partial decompression of the spinal canal. 2. There is residual enhancing tissue posterior to the spinal cord which narrows the cord to 3.5 mm at the level plaque sickle mole compression. 3. Residual enhancing tissue in the right foramen at T4-5 and T5-6 likely representing tumor. 4. Posterior stabilization hardware T3-7. 5. The lower thoracic spine is unremarkable. Electronically Signed   By: CSan MorelleM.D.   On: 03/05/2015 14:10   No results for input(s): WBC, HGB, HCT, PLT in the last 72 hours. No results for input(s): NA, K, CL, GLUCOSE, BUN, CREATININE, CALCIUM in the last 72 hours.  Invalid input(s): CO CBG (last 3)  No results for input(s): GLUCAP in the last 72 hours.  Wt Readings from Last 3 Encounters:  02/27/15 54.7 kg (120 lb 9.5 oz)  02/14/15 58.968 kg (130 lb)  02/14/15 58.968 kg (130 lb)    Physical Exam:  Constitutional: She appears well-developed and well-nourished. No distress.  HENT: dentition intact.  Head: Normocephalic and atraumatic.  Mouth/Throat: Oropharynx is clear and moist.  Eyes: Conjunctivae and EOM are normal. Conj WNL Neck: Normal range of motion. Neck supple.  Cardiovascular: Normal rate and regular rhythm.  No murmur heard. Respiratory: Effort normal and breath sounds normal.  GI: Soft. Bowel sounds are normal. She exhibits no distension. There is no tenderness.  Musculoskeletal: She exhibits no tenderness. Trace edema in feet Neurological: She is alert and oriented. Coordination (BLE ataxic) Strength UE grossly 4+/5 with inhibition proximally due to pain. LLE-- hip flexion 3+ to 4-/5, ankle dorsiflexion 4-/5, plantar flexion 4+/5, RLE--- 2/5HF, 2+KE, 3/5 ADF/APF Sensation remains decreased below T5-6,  Skin: Skin is warm  and dry. No rash noted. No erythema.  Psych: anxiety  Assessment/Plan: 1. Functional deficits secondary to T5  Cord compression/myelopathy due compression fx, metastatic breast cancer which require 3+ hours per day of interdisciplinary therapy in a comprehensive inpatient rehab setting. Physiatrist is providing close team supervision and 24 hour management of active medical problems listed below. Physiatrist and rehab team continue to assess barriers to discharge/monitor patient progress toward functional and medical goals.  Function:  Bathing Bathing position   Position: Shower  Bathing parts Body parts bathed by patient: Right arm, Left arm, Chest, Abdomen, Right upper leg, Left upper leg, Front perineal area, Buttocks, Right lower leg, Left lower leg Body parts bathed by helper: Back  Bathing assist Assist Level: Supervision or verbal cues      Upper Body Dressing/Undressing Upper body dressing   What is the patient wearing?: Bra, Pull over shirt/dress Bra - Perfomed by patient: Hook/unhook bra (pull down sports bra), Thread/unthread right bra strap, Thread/unthread left bra strap   Pull over shirt/dress - Perfomed by patient: Thread/unthread right sleeve, Thread/unthread left sleeve, Put head through opening, Pull shirt over trunk          Upper body assist Assist Level: Set up   Set up : To obtain clothing/put away  Lower Body Dressing/Undressing Lower body dressing   What is the patient wearing?: Pants, Underwear, [employer], Shoes Underwear - Performed by patient: Thread/unthread right underwear leg, Thread/unthread left underwear leg, Pull underwear up/down Underwear - Performed by helper: Pull underwear up/down Pants- Performed by patient: Thread/unthread right pants leg, Thread/unthread left pants leg, Pull pants up/down Pants- Performed by helper: Pull pants up/down Non-skid slipper socks- Performed by patient: Don/doff right sock, Don/doff left sock Non-skid slipper socks- Performed by helper: Don/doff right sock, Don/doff left sock Socks - Performed by patient: Don/doff right  sock, Don/doff left sock Socks - Performed by helper: Don/doff right sock, Don/doff left sock Shoes - Performed by patient: Don/doff right shoe, Don/doff left shoe Shoes - Performed by helper: Don/doff right shoe, Don/doff left shoe     TED Hose - Performed by patient: Don/doff right TED hose TED Hose - Performed by helper: Don/doff right TED hose, Don/doff left TED hose  Lower body assist Assist for lower body dressing: Supervision or verbal cues      Toileting Toileting   Toileting steps completed by patient: Performs perineal hygiene Toileting steps completed by helper: Adjust clothing prior to toileting, Adjust clothing after toileting Toileting Assistive Devices:  (stedy)  Toileting assist Assist level: Touching or steadying assistance (Pt.75%)   Transfers Chair/bed transfer   Chair/bed transfer method:  (stedy) Chair/bed transfer assist level: Touching or steadying assistance (Pt > 75%) Chair/bed transfer assistive device:  (stedy) Mechanical lift: Stedy   Locomotion Ambulation     Max distance: 15 Assist level: Moderate assist (Pt 50 - 74%)   Wheelchair   Type: Manual Max wheelchair distance: 300 Assist Level: No help, No cues, assistive device, takes more than reasonable amount of time  Cognition Comprehension Comprehension assist level: Follows complex conversation/direction with no assist  Expression Expression assist level: Expresses complex ideas: With extra time/assistive device  Social Interaction Social Interaction assist level: Interacts appropriately with others - No medications needed.  Problem Solving Problem solving assist level: Solves complex 90% of the time/cues < 10% of the time  Memory Memory assist level: Recognizes or recalls 90% of the time/requires cueing < 10% of the time   Medical Problem List and Plan: 1.  Functional deficits secondary to T5 cord compression/myelopathy from pathologic compression fracture, metastatic breast cancer  -I  personally reviewed the thoracic MRI which shows residual cancer at the T5-6 level affecting cord and likely foramina/nerve roots at this level  -will not further wean steroids given persistence of tumor. Don't believe NS re-consult is required at this point  -will contact onc/rad-onc about meeting with patient and family to discuss plan. There seem to be a lot of questions from both parties as to direction of care.  2. DVT Prophylaxis/Anticoagulation: Pharmaceutical: Lovenox indicated due to immobility/hypercoaguable state 3. Pain Management: . Continue oxycodone prn, robaxin prn.   -increased gabapentin to '400mg'$  TID with improvement  -robaxin prn 4. Adjustment reaction with anxiety/ Mood:   -continue low dose Xanax to help with anxiety attacks.   -Team to provide ego support.     .  5. Neuropsych: This patient is capable of making decisions on her own behalf. 6. Skin/Wound Care: Routine pressure relief measures. Maintain adequate nutrition and hydration status. 7. Fluids/Electrolytes/Nutrition: Monitor I/O. Encourage po   -recent labwork stable  -Eating 80-100% of meals 8. ABLA: post op  -no Fe++ supp  -Repeat Hb, stable on 10/20--check next week 9. HTN: Monitor BID. Continue aldactone daily and titrate as needed.   -improved control 10. Mild chronic gastritis: Continue Pepcid bid.   -bears watching while on decadron, some of chest wall, hypogastric sx may be related to this.  11. Neurogenic bowel and bladder: emptying bowels.  -voiding fairly well 12. UTI: Ucx +Klebsiella on 10/18. Bactrim completed        LOS (Days) 17 A FACE TO FACE EVALUATION WAS PERFORMED  Alejos Reinhardt T 03/07/2015 8:11 AM

## 2015-03-08 ENCOUNTER — Inpatient Hospital Stay (HOSPITAL_COMMUNITY): Payer: Medicare Other | Admitting: Physical Therapy

## 2015-03-08 NOTE — Progress Notes (Signed)
Physical Therapy Session Note  Patient Details  Name: Heather [name] MRN: [id number] Date of Birth: June 02, 1945  Today's Date: 03/08/2015 PT Individual Time: 1445-1530 PT Individual Time Calculation (min): 45 min   Short Term Goals: Week 1:  PT Short Term Goal 1 (Week 1): Pt will be able to perform basic transfers at min assist level PT Short Term Goal 1 - Progress (Week 1): Met PT Short Term Goal 2 (Week 1): Pt will be able to gait x 30' with mod assist of 1 PT Short Term Goal 2 - Progress (Week 1): Not met PT Short Term Goal 3 (Week 1): Pt will be able to demonstrate dynamic standing balance during functional task with 1 UE support with mod assist PT Short Term Goal 3 - Progress (Week 1): Not met  Skilled Therapeutic Interventions/Progress Updates:  Pt was seen bedside in the pm. Pt propelled w/c to gym with B UEs and no assist. In gym utilized 3 lbs weights for LE to increased proprioceptive feedback. Pt performed LAQs and hip flex, 2 sets x 10 reps each. In parallel bars performed multiple sit to stand transfers with min A. While standing worked on weight shifting, upright posture, unilateral stance, and stepping. Performed partial sit to stand transfers 3 sets x 5 reps each to focus on eccentric control when returning to seated position. Pt performed sliding board transfers w/c to mat, mat to w/c x 3 with S, required only occasional assist for board placement and verbal cues for technique.   Therapy Documentation Precautions:  Precautions Precautions: Fall, Back Precaution Booklet Issued: No Restrictions Weight Bearing Restrictions: No General:   Pain: Pt mod back pain, medicated during treatment.   See Function Navigator for Current Functional Status.   Therapy/Group: Individual Therapy  Dub Amis 03/08/2015, 3:45 PM

## 2015-03-08 NOTE — Progress Notes (Signed)
PHYSICAL MEDICINE & REHABILITATION     PROGRESS NOTE    Subjective/Complaints: Struggled a bit in the am yesterday when medications were a little late. Feels better about going home. Is aware that husband needs training  ROS: Pt denies fever, rash/itching, headache, blurred or double vision, nausea, vomiting, abdominal pain, diarrhea, chest pain, shortness of breath, palpitations, dysuria, dizziness, neck or back pain, bleeding, +anxiety    Objective: Vital Signs: Blood pressure 126/59, pulse 67, temperature 97.8 F (36.6 C), temperature source Oral, resp. rate 20, height '5\' 4"'$  (1.626 m), weight 54.7 kg (120 lb 9.5 oz), SpO2 98 %. No results found. No results for input(s): WBC, HGB, HCT, PLT in the last 72 hours. No results for input(s): NA, K, CL, GLUCOSE, BUN, CREATININE, CALCIUM in the last 72 hours.  Invalid input(s): CO CBG (last 3)  No results for input(s): GLUCAP in the last 72 hours.  Wt Readings from Last 3 Encounters:  02/27/15 54.7 kg (120 lb 9.5 oz)  02/14/15 58.968 kg (130 lb)  02/14/15 58.968 kg (130 lb)    Physical Exam:  Constitutional: She appears well-developed and well-nourished. No distress.  HENT: dentition intact.  Head: Normocephalic and atraumatic.  Mouth/Throat: Oropharynx is clear and moist.  Eyes: Conjunctivae and EOM are normal. Conj WNL Neck: Normal range of motion. Neck supple.  Cardiovascular: Normal rate and regular rhythm.  No murmur heard. Respiratory: Effort normal and breath sounds normal.  GI: Soft. Bowel sounds are normal. She exhibits no distension. There is no tenderness.  Musculoskeletal: She exhibits no tenderness. Trace edema in feet Neurological: She is alert and oriented. Coordination (BLE ataxic) Strength UE grossly 4+/5 with inhibition proximally due to pain. LLE-- hip flexion 3+ to 4-/5, ankle dorsiflexion 4-/5, plantar flexion 4+/5, RLE--- 2/5HF, 2+KE, 3/5 ADF/APF Sensation remains decreased below T5-6,  Skin:  Skin is warm and dry. No rash noted. No erythema.  Psych: anxiety  Assessment/Plan: 1. Functional deficits secondary to T5 Cord compression/myelopathy due compression fx, metastatic breast cancer which require 3+ hours per day of interdisciplinary therapy in a comprehensive inpatient rehab setting. Physiatrist is providing close team supervision and 24 hour management of active medical problems listed below. Physiatrist and rehab team continue to assess barriers to discharge/monitor patient progress toward functional and medical goals.  Function:  Bathing Bathing position   Position: Shower  Bathing parts Body parts bathed by patient: Right arm, Left arm, Chest, Abdomen, Right upper leg, Left upper leg, Front perineal area, Buttocks, Right lower leg, Left lower leg Body parts bathed by helper: Back  Bathing assist Assist Level: Supervision or verbal cues      Upper Body Dressing/Undressing Upper body dressing   What is the patient wearing?: Bra, Pull over shirt/dress Bra - Perfomed by patient: Hook/unhook bra (pull down sports bra), Thread/unthread right bra strap, Thread/unthread left bra strap   Pull over shirt/dress - Perfomed by patient: Thread/unthread right sleeve, Thread/unthread left sleeve, Put head through opening, Pull shirt over trunk          Upper body assist Assist Level: Set up   Set up : To obtain clothing/put away  Lower Body Dressing/Undressing Lower body dressing   What is the patient wearing?: Pants, Underwear, [employer], Shoes Underwear - Performed by patient: Thread/unthread right underwear leg, Thread/unthread left underwear leg, Pull underwear up/down Underwear - Performed by helper: Pull underwear up/down Pants- Performed by patient: Thread/unthread right pants leg, Thread/unthread left pants leg, Pull pants up/down Pants- Performed by helper:  Pull pants up/down Non-skid slipper socks- Performed by patient: Don/doff right sock, Don/doff left  sock Non-skid slipper socks- Performed by helper: Don/doff right sock, Don/doff left sock Socks - Performed by patient: Don/doff right sock, Don/doff left sock Socks - Performed by helper: Don/doff right sock, Don/doff left sock Shoes - Performed by patient: Don/doff right shoe, Don/doff left shoe Shoes - Performed by helper: Don/doff right shoe, Don/doff left shoe     TED Hose - Performed by patient: Don/doff right TED hose TED Hose - Performed by helper: Don/doff right TED hose, Don/doff left TED hose  Lower body assist Assist for lower body dressing: Supervision or verbal cues      Toileting Toileting   Toileting steps completed by patient: Performs perineal hygiene Toileting steps completed by helper: Adjust clothing prior to toileting, Adjust clothing after toileting Toileting Assistive Devices:  (stedy)  Toileting assist Assist level: Touching or steadying assistance (Pt.75%)   Transfers Chair/bed transfer   Chair/bed transfer method:  (stedy) Chair/bed transfer assist level: Touching or steadying assistance (Pt > 75%) Chair/bed transfer assistive device:  (stedy) Mechanical lift: Stedy   Locomotion Ambulation     Max distance: 15 Assist level: Moderate assist (Pt 50 - 74%)   Wheelchair   Type: Manual Max wheelchair distance: 300 Assist Level: No help, No cues, assistive device, takes more than reasonable amount of time  Cognition Comprehension Comprehension assist level: Follows complex conversation/direction with no assist  Expression Expression assist level: Expresses complex ideas: With extra time/assistive device  Social Interaction Social Interaction assist level: Interacts appropriately with others - No medications needed.  Problem Solving Problem solving assist level: Solves complex 90% of the time/cues < 10% of the time  Memory Memory assist level: Recognizes or recalls 90% of the time/requires cueing < 10% of the time   Medical Problem List and Plan: 1.  Functional deficits secondary to T5 cord compression/myelopathy from pathologic compression fracture, metastatic breast cancer  -I personally reviewed the thoracic MRI which shows residual cancer at the T5-6 level affecting cord and likely foramina/nerve roots at this level  -will not further wean steroids given persistence of tumor. Don'[name] believe NS re-consult is required at this point  -will contact onc/rad-onc about meeting with patient and family to discuss plan. There seem to be a lot of questions from both parties as to direction of care.  2. DVT Prophylaxis/Anticoagulation: Pharmaceutical: Lovenox still indicated due to immobility/hypercoaguable state 3. Pain Management: . Continue tramadol prn, robaxin prn.   -increased gabapentin to '400mg'$  TID with improvement (NEEDS TO BE ON TIME)  -robaxin prn  -consider long acting narc if pain increases 4. Adjustment reaction with anxiety/ Mood:   -continue low dose Xanax to help with anxiety attacks.         .  5. Neuropsych: This patient is capable of making decisions on her own behalf. 6. Skin/Wound Care: Routine pressure relief measures. Maintain adequate nutrition and hydration status. 7. Fluids/Electrolytes/Nutrition: Monitor I/O. Encourage po   -Eating 80-100% of meals. Check labs tomorrow 8. ABLA: post op  -no Fe++ supp  -Repeat Hb, stable on 10/20--check next week 9. HTN: Monitor BID. Continue aldactone daily and titrate as needed.   -improved control 10. Mild chronic gastritis: Continue Pepcid bid.   -bears watching while on decadron, some of chest wall, hypogastric sx may be related to this.  11. Neurogenic bowel and bladder: emptying bowels.  -voiding fairly well 12. UTI: Ucx +Klebsiella on 10/18. Bactrim completed  LOS (Days) 18 A FACE TO FACE EVALUATION WAS PERFORMED  Heather [name] 03/08/2015 8:31 AM

## 2015-03-09 ENCOUNTER — Inpatient Hospital Stay (HOSPITAL_COMMUNITY): Payer: Medicare Other | Admitting: Occupational Therapy

## 2015-03-09 ENCOUNTER — Inpatient Hospital Stay (HOSPITAL_COMMUNITY): Payer: Medicare Other

## 2015-03-09 ENCOUNTER — Telehealth: Payer: Self-pay | Admitting: Hematology and Oncology

## 2015-03-09 LAB — BASIC METABOLIC PANEL
Anion gap: 8 (ref 5–15)
BUN: 24 mg/dL — ABNORMAL HIGH (ref 6–20)
CO2: 29 mmol/L (ref 22–32)
Calcium: 9 mg/dL (ref 8.9–10.3)
Chloride: 99 mmol/L — ABNORMAL LOW (ref 101–111)
Creatinine, Ser: 0.68 mg/dL (ref 0.44–1.00)
GFR calc Af Amer: >60 mL/min (ref >60)
GFR calc non Af Amer: >60 mL/min (ref >60)
Glucose, Bld: 183 mg/dL — ABNORMAL HIGH (ref 65–99)
Potassium: 4.2 mmol/L (ref 3.5–5.1)
Sodium: 136 mmol/L (ref 135–145)

## 2015-03-09 LAB — CBC
HCT: 33.1 % — ABNORMAL LOW (ref 36.0–46.0)
Hemoglobin: 10.7 g/dL — ABNORMAL LOW (ref 12.0–15.0)
MCH: 28.9 pg (ref 26.0–34.0)
MCHC: 32.3 g/dL (ref 30.0–36.0)
MCV: 89.5 fL (ref 78.0–100.0)
Platelets: 144 10*3/uL — ABNORMAL LOW (ref 150–400)
RBC: 3.7 MIL/uL — ABNORMAL LOW (ref 3.87–5.11)
RDW: 16.1 % — ABNORMAL HIGH (ref 11.5–15.5)
WBC: 5.5 10*3/uL (ref 4.0–10.5)

## 2015-03-09 MED ORDER — MAGIC MOUTHWASH W/LIDOCAINE
5.0000 mL | Freq: Three times a day (TID) | ORAL | Status: DC
  Administered 2015-03-09 – 2015-03-12 (×8): 5 mL via ORAL
  Filled 2015-03-09 (×13): qty 5

## 2015-03-09 NOTE — Progress Notes (Signed)
Physical Therapy Session Note  Patient Details  Name: Heather [name] MRN: [id number] Date of Birth: 11/13/1945  Today's Date: 03/09/2015 PT Individual Time: 1400-1500 PT Individual Time Calculation (min): 60 min   Short Term Goals: Week 2:  PT Short Term Goal 1 (Week 2): = LTGs  Skilled Therapeutic Interventions/Progress Updates:   Neuro re-ed for gait training in maxi sky to address postural control, BLE coordination, functional strengthening, and balance x 15' x 2 with increased assist needed for turning with focus on gait pattern, knee control, and posture with +2 assist for safety.  Therapist adjusted specialty back to reposition for proper alignment for patient on pt's w/c that is going home. Demonstrated to husband how to take back on and off to allow for folding up w/c for transport. Focused rest of session on family education with patients husband with practice for w/c to recliner and then then w/c to bed (26" height to simulate home) transfer without slideboard (per pt's request) with pt directing husband's positioning. Demonstrated and practiced bed mobility techniques with recommendation to do with bed flat and then use HOB elevating features for positioning. Discussed outing goals for tomorrow with pt's husband as well who may join on the outing for further family education.   Therapy Documentation Precautions:  Precautions Precautions: Fall, Back Precaution Booklet Issued: No Restrictions Weight Bearing Restrictions: No   Pain:  Reports fatigue. Denies pain currently.  See Function Navigator for Current Functional Status.   Therapy/Group: Individual Therapy  [name], PT, DPT  03/09/2015, 3:37 PM

## 2015-03-09 NOTE — Progress Notes (Signed)
[facility] PHYSICAL MEDICINE & REHABILITATION     PROGRESS NOTE    Subjective/Complaints: Pain under better control this am. Had a good day yesterday. Mouth is sore. Slept well  ROS: Pt denies fever, rash/itching, headache, blurred or double vision, nausea, vomiting, abdominal pain, diarrhea, chest pain, shortness of breath, palpitations, dysuria, dizziness, neck or back pain, bleeding, +anxiety intermittently    Objective: Vital Signs: Blood pressure 115/61, pulse 64, temperature 98 F (36.7 C), temperature source Oral, resp. rate 18, height '5\' 4"'$  (1.626 m), weight 54.7 kg (120 lb 9.5 oz), SpO2 99 %. No results found.  Recent Labs  03/09/15 0435  WBC 5.5  HGB 10.7*  HCT 33.1*  PLT 144*    Recent Labs  03/09/15 0435  NA 136  K 4.2  CL 99*  GLUCOSE 183*  BUN 24*  CREATININE 0.68  CALCIUM 9.0   CBG (last 3)  No results for input(s): GLUCAP in the last 72 hours.  Wt Readings from Last 3 Encounters:  02/27/15 54.7 kg (120 lb 9.5 oz)  02/14/15 58.968 kg (130 lb)  02/14/15 58.968 kg (130 lb)    Physical Exam:  Constitutional: She appears well-developed and well-nourished. No distress.  HENT: dentition intact.  Head: Normocephalic and atraumatic.  Mouth/Throat: Oropharynx is clear and moist. No obvious lesions Eyes: Conjunctivae and EOM are normal. Conj WNL Neck: Normal range of motion. Neck supple.  Cardiovascular: Normal rate and regular rhythm.  No murmur heard. Respiratory: Effort normal and breath sounds normal.  GI: Soft. Bowel sounds are normal. She exhibits no distension. There is no tenderness.  Musculoskeletal: She exhibits no tenderness. Trace edema in feet Neurological: She is alert and oriented. Coordination (BLE ataxic) Strength UE grossly 4+/5 with inhibition proximally due to pain. LLE-- hip flexion 3+ to 4-/5, ankle dorsiflexion 4-/5, plantar flexion 4+/5, RLE--- 2/5HF, 2+KE, 3/5 ADF/APF Sensation remains decreased below T5-6, Neuro exam  stable 10/31 Skin: Skin is warm and dry. No rash noted. No erythema.  Psych: anxiety  Assessment/Plan: 1. Functional deficits secondary to T5 Cord compression/myelopathy due compression fx, metastatic breast cancer which require 3+ hours per day of interdisciplinary therapy in a comprehensive inpatient rehab setting. Physiatrist is providing close team supervision and 24 hour management of active medical problems listed below. Physiatrist and rehab team continue to assess barriers to discharge/monitor patient progress toward functional and medical goals.  Function:  Bathing Bathing position   Position: Shower  Bathing parts Body parts bathed by patient: Right arm, Left arm, Chest, Abdomen, Right upper leg, Left upper leg, Front perineal area, Buttocks, Right lower leg, Left lower leg Body parts bathed by helper: Back  Bathing assist Assist Level: Supervision or verbal cues      Upper Body Dressing/Undressing Upper body dressing   What is the patient wearing?: Bra, Pull over shirt/dress Bra - Perfomed by patient: Hook/unhook bra (pull down sports bra), Thread/unthread right bra strap, Thread/unthread left bra strap   Pull over shirt/dress - Perfomed by patient: Thread/unthread right sleeve, Thread/unthread left sleeve, Put head through opening, Pull shirt over trunk          Upper body assist Assist Level: Set up   Set up : To obtain clothing/put away  Lower Body Dressing/Undressing Lower body dressing   What is the patient wearing?: Pants, Underwear, [employer], Shoes Underwear - Performed by patient: Thread/unthread right underwear leg, Thread/unthread left underwear leg, Pull underwear up/down Underwear - Performed by helper: Pull underwear up/down Pants- Performed by patient: Thread/unthread  right pants leg, Thread/unthread left pants leg, Pull pants up/down Pants- Performed by helper: Pull pants up/down Non-skid slipper socks- Performed by patient: Don/doff right sock,  Don/doff left sock Non-skid slipper socks- Performed by helper: Don/doff right sock, Don/doff left sock Socks - Performed by patient: Don/doff right sock, Don/doff left sock Socks - Performed by helper: Don/doff right sock, Don/doff left sock Shoes - Performed by patient: Don/doff right shoe, Don/doff left shoe Shoes - Performed by helper: Don/doff right shoe, Don/doff left shoe     TED Hose - Performed by patient: Don/doff right TED hose TED Hose - Performed by helper: Don/doff right TED hose, Don/doff left TED hose  Lower body assist Assist for lower body dressing: Supervision or verbal cues      Toileting Toileting   Toileting steps completed by patient: Performs perineal hygiene Toileting steps completed by helper: Adjust clothing prior to toileting, Adjust clothing after toileting Toileting Assistive Devices:  (stedy)  Toileting assist Assist level: Touching or steadying assistance (Pt.75%)   Transfers Chair/bed transfer   Chair/bed transfer method: Lateral scoot Chair/bed transfer assist level: Touching or steadying assistance (Pt > 75%) Chair/bed transfer assistive device: Sliding board Mechanical lift: Stedy   Locomotion Ambulation     Max distance: 15 Assist level: Moderate assist (Pt 50 - 74%)   Wheelchair   Type: Manual Max wheelchair distance: 150 Assist Level: No help, No cues, assistive device, takes more than reasonable amount of time  Cognition Comprehension Comprehension assist level: Follows complex conversation/direction with no assist  Expression Expression assist level: Expresses complex ideas: With extra time/assistive device  Social Interaction Social Interaction assist level: Interacts appropriately with others - No medications needed.  Problem Solving Problem solving assist level: Solves complex 90% of the time/cues < 10% of the time  Memory Memory assist level: Recognizes or recalls 90% of the time/requires cueing < 10% of the time   Medical  Problem List and Plan: 1. Functional deficits secondary to T5 cord compression/myelopathy from pathologic compression fracture, metastatic breast cancer  -Follow up thoracic MRI which shows residual cancer at the T5-6 level affecting cord and likely foramina/nerve roots at this level  -will not further wean steroids given persistence of tumor. Don't believe NS re-consult is required at this time  -will contact onc/rad-onc about meeting with patient and family to discuss plan per patient's request. There seem to be a lot of questions still from both parties as to plan of care  2. DVT Prophylaxis/Anticoagulation: Pharmaceutical: Lovenox still indicated due to immobility/hypercoaguable state 3. Pain Management: . Continue tramadol prn, robaxin prn.   -increased gabapentin to '400mg'$  TID with improvement    -robaxin prn  -consider long acting narc if pain increases 4. Adjustment reaction with anxiety/ Mood:   -continue low dose Xanax to help with anxiety attacks.         .  5. Neuropsych: This patient is capable of making decisions on her own behalf. 6. Skin/Wound Care: Routine pressure relief measures. Maintain adequate nutrition and hydration status. 7. Fluids/Electrolytes/Nutrition: Monitor I/O. Encourage po   -Eating 80-100% of meals.  -I personally reviewed the patient's labs today. BUN elevated. Needs to push fluids 8. ABLA: post op  -no Fe++ supp  -Repeat Hb, 10.7 9. HTN: Monitor BID. Continue aldactone daily and titrate as needed.   -improved control 10. Mild chronic gastritis: Continue Pepcid bid.   -bears watching while on decadron 11. Neurogenic bowel and bladder: emptying bowels.  -voiding fairly well 12. UTI: Ucx +Klebsiella  on 10/18. Bactrim completed        LOS (Days) 19 A FACE TO FACE EVALUATION WAS PERFORMED  SWARTZ,ZACHARY T 03/09/2015 8:38 AM

## 2015-03-09 NOTE — Telephone Encounter (Signed)
Recd a call from rad onc and they request that we move her appointment closer to her rad onc appt,done

## 2015-03-09 NOTE — Progress Notes (Signed)
Occupational Therapy Session Note  Patient Details  Name: Heather [name] MRN: [id number] Date of Birth: 25-Feb-1946  Today's Date: 03/09/2015 OT Individual Time: 1030-1200 OT Individual Time Calculation (min): 90 min    Short Term Goals: Week 3:  OT Short Term Goal 1 (Week 3): STG=LTG due to LOS  Skilled Therapeutic Interventions/Progress Updates:    Pt seen for OT therapy session focusing on neuromuscular re-education, functional transfers, and kitchen mobility from w/c level. Pt in w/c in therapy gym upon arrival with hand off from PT. She completed sit <> stands at side of parallel bars with emphasis on standing balance, having pt reach to place horse shoes on basketball rim, requiring her to cross midline. She required min A to stand but then min-mod A for dynamic balance with blocking of L knee.  She practiced functional transfers, completing modified squat/stand pivot from w/c <> chair with B arm supports (where sliding board can't be used). 3 Transfers completed with mod-max A required and VCs for sequencing and hand placement during transfer. Educated regarding fall risk and managing B LEs during dynamic task and safety awareness.  She completed simulated shower bench transfer, initially declining use of sliding board, however, after education provided regarding skin integrity and pt's backside hitting w/c chair during transfer, pt willing to have sliding board placed and completed squat/scoot across for transfer, completing with min A.  She completed simple meal prep task in ADL apartment. Educated regarding kitchen modifications, spinal precautions during functional task, and problem solving carrying items. Min-mod VCs provided throughout for safety awareness and problem solving.  Pt returned to room at end of session, left sitting in w/c with all needs in reach.   Therapy Documentation Precautions:  Precautions Precautions: Fall, Back Precaution Booklet Issued:  No Restrictions Weight Bearing Restrictions: No Pain: Pain Assessment Pain Assessment: 0-10 Pain Score: 3  Pain Type: Neuropathic pain;Chronic pain Pain Location: Chest Pain Orientation: Right;Left Pain Descriptors / Indicators: Aching;Sharp;Shooting Pain Frequency: Intermittent Pain Onset: On-going Pain Intervention(s): Medication (See eMAR) Multiple Pain Sites: No  See Function Navigator for Current Functional Status.   Therapy/Group: Individual Therapy  Lewis, Macil Crady C 03/09/2015, 7:14 AM

## 2015-03-09 NOTE — Progress Notes (Signed)
Occupational Therapy Session Note  Patient Details  Name: Heather [name] MRN: [id number] Date of Birth: 10-17-1945  Today's Date: 03/09/2015 OT Individual Time:  -   0800-0900  (60 min)      Short Term Goals: Week 1:  OT Short Term Goal 1 (Week 1): Pt will transfer to [facility] with min A OT Short Term Goal 1 - Progress (Week 1): Progressing toward goal OT Short Term Goal 2 (Week 1): Pt will complete LB dressing task with steadying assist OT Short Term Goal 2 - Progress (Week 1): Progressing toward goal OT Short Term Goal 3 (Week 1): Pt will maintain dynamic standing balance with steadying assist in prep for LB dressing task OT Short Term Goal 3 - Progress (Week 1): Met OT Short Term Goal 4 (Week 1): Pt will display carry over of education in functional tasks with min questioning cues OT Short Term Goal 4 - Progress (Week 1): Met Week 2:  OT Short Term Goal 1 (Week 2): Pt will complete 1 step grooming task in standing with steadying assist OT Short Term Goal 1 - Progress (Week 2): Not met OT Short Term Goal 2 (Week 2): Pt will complete functional transfers using squat pivot with supervision OT Short Term Goal 2 - Progress (Week 2): Not met OT Short Term Goal 3 (Week 2): Pt will manage w/c parts independently in prep for functional tasks OT Short Term Goal 3 - Progress (Week 2): Met OT Short Term Goal 4 (Week 2): Pt will complete toileting task with steadying assist OT Short Term Goal 4 - Progress (Week 2): Partly met Week 3:  OT Short Term Goal 1 (Week 3): STG=LTG due to LOS  Skilled Therapeutic Interventions/Progress Updates:    Pt seen for OT ADL bathing and dressing session. Pt in wc upon arrival, agreeable to tx session. She used STEDY for transfer to toilet and then to shower bench  in shower. She bathed seated with supervision, completing lateral leans for buttock hygiene.   Transferred from shower bench to wc with mod assist.   She dressed seated in w/c> using sit to stand to pull pants  up. With increased time, pt donned shoes. Pt left sitting in w/c at end of session.     Pt educated throughout session regarding energy conservation, safe transfers,  transfer techniques, and d/c planning.    Therapy Documentation Precautions:  Precautions Precautions: Fall, Back Precaution Booklet Issued: No Restrictions Weight Bearing Restrictions: No :   Pain: Pain Assessment Pain Assessment: 0-10 Pain Score: 3 Pain Type: Neuropathic pain;Chronic pain Pain Location: Chest Pain Orientation: Right;Left Pain Descriptors / Indicators: Aching;Sharp;Shooting Pain Frequency: Intermittent Pain Onset: On-going Pain Intervention(s): Medication (See eMAR) Multiple Pain Sites: No   See Function Navigator for Current Functional Status.   Therapy/Group: Individual Therapy  Lisa Roca 03/09/2015, 7:43 AM

## 2015-03-09 NOTE — Progress Notes (Signed)
Physical Therapy Session Note  Patient Details  Name: Heather [name] MRN: [id number] Date of Birth: 1945-09-09  Today's Date: 03/09/2015 PT Individual Time: 1000-1030 PT Individual Time Calculation (min): 30 min   Skilled Therapeutic Interventions/Progress Updates:   Session focused on addressing plan and goals for planned outing tomorrow (discussed barriers in regards to community mobility, energy conservation, and goals) and LE strengthening on Kinetron (seated in w/c) x 12 min with focus on control and knee positioning.  Therapy Documentation Precautions:  Precautions Precautions: Fall, Back Precaution Booklet Issued: No Restrictions Weight Bearing Restrictions: No   Pain:  Premedicated for pain in back and chest area.    See Function Navigator for Current Functional Status.   Therapy/Group: Individual Therapy  [name], PT, DPT  03/09/2015, 12:04 PM

## 2015-03-10 ENCOUNTER — Inpatient Hospital Stay (HOSPITAL_COMMUNITY): Payer: Medicare Other | Admitting: Occupational Therapy

## 2015-03-10 ENCOUNTER — Inpatient Hospital Stay (HOSPITAL_COMMUNITY): Payer: Medicare Other | Admitting: *Deleted

## 2015-03-10 LAB — URINALYSIS, ROUTINE W REFLEX MICROSCOPIC
Bilirubin Urine: NEGATIVE
Glucose, UA: NEGATIVE mg/dL
Ketones, ur: NEGATIVE mg/dL
Nitrite: NEGATIVE
Protein, ur: NEGATIVE mg/dL
Specific Gravity, Urine: 1.017 (ref 1.005–1.030)
Urobilinogen, UA: 1 mg/dL (ref 0.0–1.0)
pH: 7 (ref 5.0–8.0)

## 2015-03-10 LAB — URINE MICROSCOPIC-ADD ON

## 2015-03-10 MED ORDER — SULFAMETHOXAZOLE-TRIMETHOPRIM 400-80 MG PO TABS
1.0000 | ORAL_TABLET | Freq: Two times a day (BID) | ORAL | Status: DC
  Administered 2015-03-10 – 2015-03-11 (×2): 1 via ORAL
  Filled 2015-03-10 (×4): qty 1

## 2015-03-10 NOTE — Progress Notes (Signed)
PHYSICAL MEDICINE & REHABILITATION     PROGRESS NOTE    Subjective/Complaints: Developed urinary incontinence yesterday, dribbling, feels full. Pain under reasonable control. Anxious about med rx'es at discharge  ROS: Pt denies fever, rash/itching, headache, blurred or double vision, nausea, vomiting, abdominal pain, diarrhea, chest pain, shortness of breath, palpitations, dysuria, dizziness, neck or back pain, bleeding, +anxiety intermittently    Objective: Vital Signs: Blood pressure 122/76, pulse 75, temperature 97.9 F (36.6 C), temperature source Oral, resp. rate 18, height '5\' 4"'$  (1.626 m), weight 54.7 kg (120 lb 9.5 oz), SpO2 99 %. No results found.  Recent Labs  03/09/15 0435  WBC 5.5  HGB 10.7*  HCT 33.1*  PLT 144*    Recent Labs  03/09/15 0435  NA 136  K 4.2  CL 99*  GLUCOSE 183*  BUN 24*  CREATININE 0.68  CALCIUM 9.0   CBG (last 3)  No results for input(s): GLUCAP in the last 72 hours.  Wt Readings from Last 3 Encounters:  02/27/15 54.7 kg (120 lb 9.5 oz)  02/14/15 58.968 kg (130 lb)  02/14/15 58.968 kg (130 lb)    Physical Exam:  Constitutional: She appears well-developed and well-nourished. No distress.  HENT: dentition intact.  Head: Normocephalic and atraumatic.  Mouth/Throat: Oropharynx is clear and moist. No obvious lesions Eyes: Conjunctivae and EOM are normal. Conj WNL Neck: Normal range of motion. Neck supple.  Cardiovascular: Normal rate and regular rhythm.  No murmur heard. Respiratory: Effort normal and breath sounds normal.  GI: Soft. Bowel sounds are normal. She exhibits no distension. There is no tenderness.  Musculoskeletal: She exhibits no tenderness. Trace edema in feet Neurological: She is alert and oriented. Coordination (BLE ataxic) Strength UE grossly 4+/5 with inhibition proximally due to pain. LLE-- hip flexion 3+ to 4-/5, ankle dorsiflexion 4-/5, plantar flexion 4+/5, RLE--- 2/5HF, 2+KE, 3/5  ADF/APF Sensation remains decreased below T5-6, Neuro exam stable 11/1 Skin: Skin is warm and dry. No rash noted. No erythema.  Psych: anxiety  Assessment/Plan: 1. Functional deficits secondary to T5 Cord compression/myelopathy due compression fx, metastatic breast cancer which require 3+ hours per day of interdisciplinary therapy in a comprehensive inpatient rehab setting. Physiatrist is providing close team supervision and 24 hour management of active medical problems listed below. Physiatrist and rehab team continue to assess barriers to discharge/monitor patient progress toward functional and medical goals.  Function:  Bathing Bathing position   Position: Shower  Bathing parts Body parts bathed by patient: Right arm, Left arm, Chest, Abdomen, Right upper leg, Left upper leg, Front perineal area, Buttocks, Right lower leg, Left lower leg Body parts bathed by helper: Back  Bathing assist Assist Level: Supervision or verbal cues      Upper Body Dressing/Undressing Upper body dressing   What is the patient wearing?: Bra, Pull over shirt/dress Bra - Perfomed by patient: Hook/unhook bra (pull down sports bra), Thread/unthread right bra strap, Thread/unthread left bra strap   Pull over shirt/dress - Perfomed by patient: Thread/unthread right sleeve, Thread/unthread left sleeve, Put head through opening, Pull shirt over trunk          Upper body assist Assist Level: Set up   Set up : To obtain clothing/put away  Lower Body Dressing/Undressing Lower body dressing   What is the patient wearing?: Pants, Underwear, [employer], Shoes Underwear - Performed by patient: Thread/unthread right underwear leg, Thread/unthread left underwear leg, Pull underwear up/down Underwear - Performed by helper: Pull underwear up/down Pants- Performed by patient:  Thread/unthread right pants leg, Thread/unthread left pants leg, Pull pants up/down Pants- Performed by helper: Pull pants up/down Non-skid  slipper socks- Performed by patient: Don/doff right sock, Don/doff left sock Non-skid slipper socks- Performed by helper: Don/doff right sock, Don/doff left sock Socks - Performed by patient: Don/doff right sock, Don/doff left sock Socks - Performed by helper: Don/doff right sock, Don/doff left sock Shoes - Performed by patient: Don/doff right shoe, Don/doff left shoe Shoes - Performed by helper: Don/doff right shoe, Don/doff left shoe     TED Hose - Performed by patient: Don/doff right TED hose TED Hose - Performed by helper: Don/doff right TED hose, Don/doff left TED hose  Lower body assist Assist for lower body dressing: Touching or steadying assistance (Pt > 75%)      Toileting Toileting   Toileting steps completed by patient: Performs perineal hygiene, Adjust clothing prior to toileting Toileting steps completed by helper: Adjust clothing after toileting Toileting Assistive Devices:  (stedy)  Toileting assist Assist level: Touching or steadying assistance (Pt.75%)   Transfers Chair/bed transfer   Chair/bed transfer method: Lateral scoot Chair/bed transfer assist level: Touching or steadying assistance (Pt > 75%) Chair/bed transfer assistive device: Armrests Mechanical lift: Stedy   Locomotion Ambulation     Max distance: 30 Assist level: Total assist (Pt < 25%) (maxi sky)   Wheelchair   Type: Manual Max wheelchair distance: 150 Assist Level: No help, No cues, assistive device, takes more than reasonable amount of time  Cognition Comprehension Comprehension assist level: Follows complex conversation/direction with no assist  Expression Expression assist level: Expresses complex ideas: With extra time/assistive device  Social Interaction Social Interaction assist level: Interacts appropriately with others - No medications needed.  Problem Solving Problem solving assist level: Solves complex 90% of the time/cues < 10% of the time  Memory Memory assist level: Recognizes or  recalls 90% of the time/requires cueing < 10% of the time   Medical Problem List and Plan: 1. Functional deficits secondary to T5 cord compression/myelopathy from pathologic compression fracture, metastatic breast cancer  -family ed prior to dc  -contacted onc/rad-onc about meeting with patient and family to discuss plan per patient's request. There seem to be a lot of questions still from both parties as to plan of care  2. DVT Prophylaxis/Anticoagulation: Pharmaceutical: Lovenox still indicated due to immobility/hypercoaguable state 3. Pain Management: . Continue tramadol prn, robaxin prn.   -increased gabapentin to '400mg'$  TID with improvement    -robaxin prn  -consider long acting narc if pain increases 4. Adjustment reaction with anxiety/ Mood:   -continue low dose Xanax to help with anxiety attacks.         .  5. Neuropsych: This patient is capable of making decisions on her own behalf. 6. Skin/Wound Care: Routine pressure relief measures. Maintain adequate nutrition and hydration status. 7. Fluids/Electrolytes/Nutrition: Monitor I/O. Encourage po   -Eating 80-100% of meals.   8. ABLA: post op  -no Fe++ supp  -Repeat Hb, 10.7 9. HTN: Monitor BID. Continue aldactone daily and titrate as needed.   -improved control 10. Mild chronic gastritis: Continue Pepcid bid.   -bears watching while on decadron 11. Neurogenic bowel and bladder: emptying bowels.  -voiding fairly well 12. UTI: Ucx +Klebsiella on 10/18. Bactrim completed  -urine for ua,cx given recurrent sx        LOS (Days) 20 A FACE TO FACE EVALUATION WAS PERFORMED  Tanis Burnley T 03/10/2015 8:33 AM

## 2015-03-10 NOTE — Progress Notes (Signed)
2 urinary incontinent episodes today. Also, complaining urgency and "full sensation". PVR=0. Paged Silvestre Mesi, PA, obtained a UA & CS order. Will collect on next void. PRN robaxin and ultram given at 2153, per patient's request. Incontinence brief placed per patient. Heather [name] A

## 2015-03-10 NOTE — Progress Notes (Signed)
Physical Therapy Session Note  Patient Details  Name: Heather [name] MRN: [id number] Date of Birth: 1945-09-25  Today's Date: 03/10/2015 PT Individual Time: 0800-0900 PT Individual Time Calculation (min): 60 min   Short Term Goals: Week 2:  PT Short Term Goal 1 (Week 2): = LTGs  Skilled Therapeutic Interventions/Progress Updates:   Pt upset about urinary incontinence that started yesterday. Pt at supervision level to use Stedy to transfer off of toilet and to manage brief. Discussed with patient starting to practice transfers to toilet how she is going to do them at home instead of using [name] and will discuss with primary OT. Pt performed oral hygiene at sink in stedy in order to promote weightbearing through BLE and maintain balance and then transferred back to bed with Stedy. Donned leg loops and practiced bed mobility with leg loops at pt able to do at supervision to min assist level with HOB elevated. Issued BLE HEP to address neuro re-ed, strengthening, control, and endurance x 10 reps each including ankle pumps, heel slides, SAQ, glut sets and hip abduction using leg loops for assisted movement. Repositioned in supine using bridging with all needs in reach.  Therapy Documentation Precautions:  Precautions Precautions: Fall, Back Precaution Booklet Issued: No Restrictions Weight Bearing Restrictions: No  Pain: Reports pain is ok this morning.    See Function Navigator for Current Functional Status.   Therapy/Group: Individual Therapy  Heather [name], PT, DPT  03/10/2015, 9:16 AM

## 2015-03-10 NOTE — Plan of Care (Signed)
Problem: RH BLADDER ELIMINATION Goal: RH STG MANAGE BLADDER WITH ASSISTANCE STG Manage Bladder With Mod I Assistance  Outcome: Not Progressing New onset of urinary urgency and frequency. Incontinent X 1. UA & CS ordered.

## 2015-03-10 NOTE — Progress Notes (Signed)
Recreational Therapy Session Note  Patient Details  Name: Heather [name] MRN: [id number] Date of Birth: [birth year] Today's Date: 03/10/2015  Pain: no c/o Skilled Therapeutic Interventions/Progress Updates: Pt participated in community reintegration/outing to [employer] at overall supervision- min assist w/c level.  Pt required min assist for w/c mobility up and down ramps/curb cuts, otherwise supervision.  Goals focused on functional mobility on various community surface types, identification & negotiation of obstacles, energy conservation, public restroom access & family education with pt's husband specifically in regards to toileting in public restrooms.  See outing goal sheet for full details.  Therapy/Group: [employer]   Evangelyne Loja 03/10/2015, 1:36 PM

## 2015-03-10 NOTE — Progress Notes (Signed)
Occupational Therapy Session Note  Patient Details  Name: Heather [name] MRN: [id number] Date of Birth: 01-01-1946  Today's Date: 03/10/2015 OT Individual Time: 0930-1030 OT Individual Time Calculation (min): 60 min    Short Term Goals: Week 3:  OT Short Term Goal 1 (Week 3): STG=LTG due to LOS  Skilled Therapeutic Interventions/Progress Updates:    Pt seen for OT ADL bathing and dressing session. Pt in supine upon arrival, voicing need to complete toileting task. She completed sliding board transfer to [facility] in simulation for toileting task for how she will do it at home. Min A and VCs required for sliding board transfer.  She then transferred to w/c and completed bathing/dressing task seated in w/c at the sink. Educated regarding energy conservation for bathing/dressing tasks at home. She completed sit <> stand at sink with min A and total A provided for hygiene and clothing management, per recommendation for home standing tasks. Pt required rest breaks throughout, specifically following transfers and sit <> stands. Pt left sitting in w/c at end of session, all needs in reach.  Education provided throughout session regarding energy conservation, reducing caregiver burden, spinal pre-cautions during functional tasks, and d/c planning.  Therapy Documentation Precautions:  Precautions Precautions: Fall, Back Precaution Booklet Issued: No Restrictions Weight Bearing Restrictions: No Pain: Pain Assessment Pain Assessment: 0-10 Pain Score: 0-No pain (no pain, wanted meds before therapy)  See Function Navigator for Current Functional Status.   Therapy/Group: Individual Therapy  Lewis, Zamere Pasternak C 03/10/2015, 7:18 AM

## 2015-03-10 NOTE — Progress Notes (Signed)
Physical Therapy Session Note  Patient Details  Name: Heather [name] MRN: [id number] Date of Birth: Mar 03, 1946  Today's Date: 03/10/2015 PT Individual Time: 1100-1300 PT Individual Time Calculation (min): 120 min   Skilled Therapeutic Interventions/Progress Updates:   Pt participated in community outing to [employer] and performed family education with pt's husband on outing as well in regards to community mobility, energy conservation, and performed hands on family education for transfers on and off real toilet for toileting needs. See outing goal sheet for details.  Therapy Documentation Precautions:  Precautions Precautions: Fall, Back Precaution Booklet Issued: No Restrictions Weight Bearing Restrictions: No     See Function Navigator for Current Functional Status.   Therapy/Group: Co-Treatment and Community Reintegration with TR  Allayne Gitelman 03/10/2015, 1:12 PM

## 2015-03-11 ENCOUNTER — Inpatient Hospital Stay (HOSPITAL_COMMUNITY): Payer: Medicare Other | Admitting: Physical Therapy

## 2015-03-11 ENCOUNTER — Inpatient Hospital Stay (HOSPITAL_COMMUNITY): Payer: Medicare Other | Admitting: Occupational Therapy

## 2015-03-11 LAB — CBC
HCT: 37.7 % (ref 36.0–46.0)
Hemoglobin: 12.6 g/dL (ref 12.0–15.0)
MCH: 29.9 pg (ref 26.0–34.0)
MCHC: 33.4 g/dL (ref 30.0–36.0)
MCV: 89.3 fL (ref 78.0–100.0)
Platelets: 135 10*3/uL — ABNORMAL LOW (ref 150–400)
RBC: 4.22 MIL/uL (ref 3.87–5.11)
RDW: 16.4 % — ABNORMAL HIGH (ref 11.5–15.5)
WBC: 7.2 10*3/uL (ref 4.0–10.5)

## 2015-03-11 MED ORDER — DEXAMETHASONE 4 MG PO TABS: 4.0000 mg | ORAL_TABLET | Freq: Two times a day (BID) | ORAL | Status: DC

## 2015-03-11 MED ORDER — ENOXAPARIN SODIUM 40 MG/0.4ML ~~LOC~~ SOLN: 40.0000 mg | SUBCUTANEOUS | Status: DC

## 2015-03-11 MED ORDER — METHOCARBAMOL 500 MG PO TABS: 500.0000 mg | ORAL_TABLET | Freq: Every day | ORAL | Status: DC | PRN

## 2015-03-11 MED ORDER — SULFAMETHOXAZOLE-TRIMETHOPRIM 800-160 MG PO TABS
1.0000 | ORAL_TABLET | Freq: Two times a day (BID) | ORAL | Status: DC
  Administered 2015-03-11 – 2015-03-12 (×2): 1 via ORAL
  Filled 2015-03-11 (×4): qty 1

## 2015-03-11 MED ORDER — POLYETHYLENE GLYCOL 3350 17 G PO PACK: 17.0000 g | PACK | Freq: Two times a day (BID) | ORAL | Status: DC

## 2015-03-11 MED ORDER — TRAMADOL HCL 50 MG PO TABS: 50.0000 mg | ORAL_TABLET | Freq: Four times a day (QID) | ORAL | Status: DC | PRN

## 2015-03-11 MED ORDER — SULFAMETHOXAZOLE-TRIMETHOPRIM 800-160 MG PO TABS: 1.0000 | ORAL_TABLET | Freq: Two times a day (BID) | ORAL | Status: DC

## 2015-03-11 MED ORDER — LIDOCAINE 5 % EX PTCH: MEDICATED_PATCH | CUTANEOUS | Status: DC

## 2015-03-11 MED ORDER — GABAPENTIN 400 MG PO CAPS: 400.0000 mg | ORAL_CAPSULE | Freq: Three times a day (TID) | ORAL | Status: DC

## 2015-03-11 NOTE — Patient Care Conference (Signed)
Inpatient RehabilitationTeam Conference and Plan of Care Update Date: 03/10/2015   Time: 2:05 PM    Patient Name: Heather [name]      Medical Record Number: [id number]  Date of Birth: 20-Jan-1946 Sex: Female         Room/Bed: 4M02C/4M02C-01 Payor Info: Payor: MEDICARE / Plan: MEDICARE PART A AND B / Product Type: *No Product type* /    Admitting Diagnosis: decompression , myelopathy   Admit Date/Time:  02/18/2015  5:58 PM Admission Comments: No comment available   Primary Diagnosis:  Paraplegia at T4 level [facility]) Principal Problem: Paraplegia at T4 level [facility])  Patient Active Problem List   Diagnosis Date Noted  . Pain   . Urinary tract infection, site not specified   . Paraplegia at T4 level ([place]) 02/20/2015  . Neurogenic bowel 02/20/2015  . Neurogenic bladder 02/20/2015  . Postoperative anemia due to acute blood loss 02/17/2015  . Spinal cord compression due to malignant neoplasm metastatic to spine ([place]) 02/15/2015  . Epidural mass 02/15/2015  . Thoracic spine tumor   . Pathologic fracture of vertebra 02/14/2015  . Pathologic fracture of thoracic vertebrae 02/14/2015  . Breast cancer metastasized to bone ([place]) 02/14/2015  . Hyperlipidemia 02/14/2015  . H. pylori infection   . HTN (hypertension) 04/17/2012  . Primary cancer of upper outer quadrant of right female breast ([place]) 03/08/2012    Expected Discharge Date: Expected Discharge Date: 03/12/15  Team Members Present: Physician leading conference: Dr. Alger Simons Social Worker Present: Lennart Pall, LCSW Nurse Present: Heather Roberts, RN PT Present: Canary Brim, PT OT Present: Napoleon Form, OT SLP Present: Weston Anna, SLP PPS Coordinator present : Daiva Nakayama, RN, CRRN     Current Status/Progress Goal Weekly Team Focus  Medical   pain better. ?increased weakness--MRI without advancement of disease but still prominent tumor. working on plan with onc/rad-onc  anxiety control, prepare pt/family medically for dc  uti, pain  control   Bowel/Bladder   Continent of bowel and bladder. LBM 03/08/15  Pt to remain continent of bowel and bladder  Monitor   Swallow/Nutrition/ Hydration             ADL's   Supervision- min A sliding board transfers; Steadying assist LB dressing; Mod I UB bathing/ dressing and grooming  Supervision- min A overall  Family training; functional transfers   Mobility   S slideboard transfers; +2 gait and stairs; mod I w/c mobility  downgraded goals to supervision w/c level transfers, min A car transfers and +2 gait with PT only  Fam ed, d/c planning, neuro re-ed, balance, strengthening, transfers, gait, endurance   Communication             Safety/Cognition/ Behavioral Observations            Pain   Tramadol '50mg'$  q 4hrs, Neurotin '400mg'$  TID  <4  Monitor   Skin   Incision line with dermabond, OTA, healed  No additonal skin breakdown  Assess q shift    Rehab Goals Patient on target to meet rehab goals: Yes *See Care Plan and progress notes for long and short-term goals.  Barriers to Discharge: anxiety, advanced cancer    Possible Resolutions to Barriers:  rx above, reassurance, family ed, plan with onc/rad-onc    Discharge Planning/Teaching Needs:  home with husband who can provide 24/7 assistance      Team Discussion:  Pain decreased.  Lab to check UTI - + so will add abx.  Family ed continues.  Husband  cleared to assist with transfers.  Outing today went well.  On track to meet goals.  Revisions to Treatment Plan:  None   Continued Need for Acute Rehabilitation Level of Care: The patient requires daily medical management by a physician with specialized training in physical medicine and rehabilitation for the following conditions: Daily direction of a multidisciplinary physical rehabilitation program to ensure safe treatment while eliciting the highest outcome that is of practical value to the patient.: Yes Daily medical management of patient stability for increased activity  during participation in an intensive rehabilitation regime.: Yes Daily analysis of laboratory values and/or radiology reports with any subsequent need for medication adjustment of medical intervention for : Neurological problems;Other;Post surgical problems  [name] 03/11/2015, 1:13 PM

## 2015-03-11 NOTE — Progress Notes (Signed)
Physical Therapy Session Note  Patient Details  Name: Heather [name] MRN: [id number] Date of Birth: 23-Aug-1945  Today's Date: 03/11/2015 PT Individual Time: 1015-1100 PT Individual Time Calculation (min): 45 min   Short Term Goals: Week 2:  PT Short Term Goal 1 (Week 2): = LTGs  Skilled Therapeutic Interventions/Progress Updates:    Pt received up in w/c, agreeable to PT session. Therapeutic Activity: PT assisted pt in donning leg loops (at ankles) and pt demonstrates scoot transfer into bed req min A at trunk halfway through slideboard transfer due to LOB posterior - difficulty with head-hips relationship transferring into bed. Pt directs PT well in blocking L foot on floor. Pt demonstrates sit to supine via side lie transfer with leg loops mod I. Pt rolls L with verbal cues/supervision to allow one leg to ER at hip, so hands only need to manage other leg in hook lie while maintaining spinal precautions to roll in flat bed without rail. Pt unable to sit up from L side lie on eob without min A at trunk. Pt transfers bed to w/c with slideboard with set up and close SBA. Gait Training - PT sets pt up in maxi-sky and instructs pt in ascending/descending 4" step with RW x 1 rep - pt req assist to place/remove legs from step, assist placing/removing RW onto & off of step - major LOB req assist from maxi-sky sling to prevent a fall. Pt ended up in w/c with all needs in reach. PT suggests pt trial step-ups in // bars for improved LE strengthening/standing practice in the future with Maxi-Sky sling for safety, in order to reduce need to manage RW at future therapy sessions.   Therapy Documentation Precautions:  Precautions Precautions: Fall, Back Precaution Booklet Issued: No Precaution Comments: recalls 3/3 back precautions without cuing Restrictions Weight Bearing Restrictions: No Pain: Pain Assessment Pain Assessment: 0-10 Pain Score: 3  Pain Type: Neuropathic pain Pain Location: Chest Pain  Orientation: Right;Left Pain Descriptors / Indicators: Nagging;Sharp Pain Onset: On-going Pain Intervention(s): Medication (See eMAR) Multiple Pain Sites: No   See Function Navigator for Current Functional Status.   Therapy/Group: Individual Therapy  Zaidyn Claire M 03/11/2015, 10:18 AM

## 2015-03-11 NOTE — Progress Notes (Signed)
Educated patient on lovenox self injections.  Patient demonstrated proper technique in choosing site and injecting self.  Patient verbalized understanding of instructions and feels comfortable injecting self at home.  Brita Romp, RN

## 2015-03-11 NOTE — Plan of Care (Signed)
Problem: RH Toilet Transfers Goal: LTG Patient will perform toilet transfers w/assist (OT) LTG: Patient will perform toilet transfers with assist, with/without cues using equipment (OT)  Outcome: Not Met (add Reason) Pt requires min A for blocking of LE during functional sliding board transfers.

## 2015-03-11 NOTE — Progress Notes (Signed)
Social Work Patient ID: Heather [name], female   DOB: 21-Apr-1946, 69 y.o.   MRN: [id number]   Lowella Curb, LCSW Social Worker Signed  Patient Care Conference 03/11/2015  1:13 PM    Expand All Collapse All   Inpatient RehabilitationTeam Conference and Plan of Care Update Date: 03/10/2015   Time: 2:05 PM     Patient Name: Heather [name]      Medical Record Number: [id number]  Date of Birth: [birth year] Sex: Female         Room/Bed: 4M02C/4M02C-01 Payor Info: Payor: MEDICARE / Plan: MEDICARE PART A AND B / Product Type: *No Product type* /    Admitting Diagnosis: decompression , myelopathy    Admit Date/Time:  02/18/2015  5:58 PM Admission Comments: No comment available   Primary Diagnosis:  Paraplegia at T4 level [facility]) Principal Problem: Paraplegia at T4 level [facility])    Patient Active Problem List     Diagnosis  Date Noted   .  Pain     .  Urinary tract infection, site not specified     .  Paraplegia at T4 level ([place])  02/20/2015   .  Neurogenic bowel  02/20/2015   .  Neurogenic bladder  02/20/2015   .  Postoperative anemia due to acute blood loss  02/17/2015   .  Spinal cord compression due to malignant neoplasm metastatic to spine ([place])  02/15/2015   .  Epidural mass  02/15/2015   .  Thoracic spine tumor     .  Pathologic fracture of vertebra  02/14/2015   .  Pathologic fracture of thoracic vertebrae  02/14/2015   .  Breast cancer metastasized to bone ([place])  02/14/2015   .  Hyperlipidemia  02/14/2015   .  H. pylori infection     .  HTN (hypertension)  04/17/2012   .  Primary cancer of upper outer quadrant of right female breast ([place])  03/08/2012     Expected Discharge Date: Expected Discharge Date: 03/12/15  Team Members Present: Physician leading conference: Dr. Alger Simons Social Worker Present: Lennart Pall, LCSW Nurse Present: Heather Roberts, RN PT Present: Canary Brim, PT OT Present: Napoleon Form, OT SLP Present: Weston Anna, SLP PPS Coordinator present : Daiva Nakayama,  RN, CRRN        Current Status/Progress  Goal  Weekly Team Focus   Medical     pain better. ?increased weakness--MRI without advancement of disease but still prominent tumor. working on plan with onc/rad-onc  anxiety control, prepare pt/family medically for dc  uti, pain control   Bowel/Bladder     Continent of bowel and bladder. LBM 03/08/15   Pt to remain continent of bowel and bladder   Monitor   Swallow/Nutrition/ Hydration               ADL's     Supervision- min A sliding board transfers; Steadying assist LB dressing; Mod I UB bathing/ dressing and grooming   Supervision- min A overall  Family training; functional transfers    Mobility     S slideboard transfers; +2 gait and stairs; mod I w/c mobility   downgraded goals to supervision w/c level transfers, min A car transfers and +2 gait with PT only  Fam ed, d/c planning, neuro re-ed, balance, strengthening, transfers, gait, endurance   Communication               Safety/Cognition/ Behavioral Observations  Pain     Tramadol '50mg'$  q 4hrs, Neurotin '400mg'$  TID  <4  Monitor   Skin     Incision line with dermabond, OTA, healed   No additonal skin breakdown  Assess q shift    Rehab Goals Patient on target to meet rehab goals: Yes *See Care Plan and progress notes for long and short-term goals.    Barriers to Discharge:  anxiety, advanced cancer     Possible Resolutions to Barriers:   rx above, reassurance, family ed, plan with onc/rad-onc     Discharge Planning/Teaching Needs:   home with husband who can provide 24/7 assistance        Team Discussion:    Pain decreased.  Lab to check UTI - + so will add abx.  Family ed continues.  Husband cleared to assist with transfers.  Outing today went well.  On track to meet goals.   Revisions to Treatment Plan:    None    Continued Need for Acute Rehabilitation Level of Care: The patient requires daily medical management by a physician with specialized training in  physical medicine and rehabilitation for the following conditions: Daily direction of a multidisciplinary physical rehabilitation program to ensure safe treatment while eliciting the highest outcome that is of practical value to the patient.: Yes Daily medical management of patient stability for increased activity during participation in an intensive rehabilitation regime.: Yes Daily analysis of laboratory values and/or radiology reports with any subsequent need for medication adjustment of medical intervention for : Neurological problems;Other;Post surgical problems  Heather [name] 03/11/2015, 1:13 PM                 Lowella Curb, LCSW Social Worker Signed  Patient Care Conference 03/04/2015  1:10 PM    Expand All Collapse All   Inpatient RehabilitationTeam Conference and Plan of Care Update Date: 03/03/2015   Time: 10:35 AM     Patient Name: Heather [name]      Medical Record Number: [id number]  Date of Birth: [birth year] Sex: Female         Room/Bed: 4M02C/4M02C-01 Payor Info: Payor: MEDICARE / Plan: MEDICARE PART A AND B / Product Type: *No Product type* /    Admitting Diagnosis: decompression , myelopathy    Admit Date/Time:  02/18/2015  5:58 PM Admission Comments: No comment available   Primary Diagnosis:  Paraplegia at T4 level [facility]) Principal Problem: Paraplegia at T4 level [facility])    Patient Active Problem List     Diagnosis  Date Noted   .  Pain     .  Urinary tract infection, site not specified     .  Paraplegia at T4 level ([place])  02/20/2015   .  Neurogenic bowel  02/20/2015   .  Neurogenic bladder  02/20/2015   .  Postoperative anemia due to acute blood loss  02/17/2015   .  Spinal cord compression due to malignant neoplasm metastatic to spine ([place])  02/15/2015   .  Epidural mass  02/15/2015   .  Thoracic spine tumor     .  Pathologic fracture of vertebra  02/14/2015   .  Pathologic fracture of thoracic vertebrae  02/14/2015   .  Breast cancer metastasized to bone  ([place])  02/14/2015   .  Hyperlipidemia  02/14/2015   .  H. pylori infection     .  HTN (hypertension)  04/17/2012   .  Primary cancer of upper outer quadrant of right female breast ([place])  03/08/2012     Expected Discharge Date: Expected Discharge Date: 03/12/15  Team Members Present: Physician leading conference: Dr. Delice Lesch Social Worker Present: Lennart Pall, LCSW Nurse Present: Dorien Chihuahua, RN PT Present: Jorge Mandril, PT OT Present: Napoleon Form, OT PPS Coordinator present : Daiva Nakayama, RN, CRRN        Current Status/Progress  Goal  Weekly Team Focus   Medical     T4 Paraplegia with b/l LE weakness, anxiety, pain  Improve anxiety, pain, wean steroids   Wean steroids, improve pain, improve anxiety    Bowel/Bladder     Continent of bowel and bladder. LBM 03/01/15   Pt to remain continent of bowel and bladder   Monitor   Swallow/Nutrition/ Hydration               ADL's     Min LB dressing and bathing ; Supervision- min sliding board transfers; mod-max toileting;   Supervision- min A overall  Functional transfers; functional standing balance; IADL re-training from w/c level; ADL re-training   Mobility     min assist slideboard transfers; +2 gait and stairs; mod I w/c mobility  supervision w/c level transfers; min A car transfers; mod A gait with PT and +2 for stairs with PT (downgraded due to lack of progress)   family education, neuro re-ed, balance, endurance, strengthening, transfers, standing, gait   Communication               Safety/Cognition/ Behavioral Observations              Pain     Tramadol '50mg'$  q 6hrs, Neurotin '300mg'$  bid  <4  Monitor for effectiveness   Skin     Incisional line with dermabond, OTA, healing appropraitely  No aditional skin breakdown  Monitor    Rehab Goals Patient on target to meet rehab goals: Yes *See Care Plan and progress notes for long and short-term goals.    Barriers to Discharge:  Pain management, anxiety     Possible  Resolutions to Barriers:   Adjust pain meds, coping techniques      Discharge Planning/Teaching Needs:   home with husband who can provide 24/7 assistance        Team Discussion:    Pain continues to be an issue.  Pt very reluctant to use meds.  Anxiety continues.  Gait goals d/c'd and planning for supervision with sl.board transfers.  Ramp has been completed.   Revisions to Treatment Plan:    Gait goals d/c'd    Continued Need for Acute Rehabilitation Level of Care: The patient requires daily medical management by a physician with specialized training in physical medicine and rehabilitation for the following conditions: Daily direction of a multidisciplinary physical rehabilitation program to ensure safe treatment while eliciting the highest outcome that is of practical value to the patient.: Yes Daily medical management of patient stability for increased activity during participation in an intensive rehabilitation regime.: Yes Daily analysis of laboratory values and/or radiology reports with any subsequent need for medication adjustment of medical intervention for : Neurological problems;Other;Post surgical problems  Heather [name] 03/04/2015, 1:10 PM

## 2015-03-11 NOTE — Progress Notes (Signed)
Physical Therapy Discharge Summary  Patient Details  Name: Heather [name] MRN: [id number] Date of Birth: [birth year]  Patient has met 8 of 8 long term goals due to improved activity tolerance, improved balance, improved pain, ability to compensate for deficits and functional use of  right lower extremity and left lower extremity.  Patient to discharge at a wheelchair level supervision to min assist.   Patient's husband is independent to provide the necessary physical ssistance and supervision at discharge.  Occasional steady assist required during bed>chair transfer due to LLE extensor tone and posterior LOB.    Recommendation:  Patient will benefit from ongoing skilled PT services in home health setting to continue to advance safe functional mobility, address ongoing impairments in paraparesis, endurance, pain, strength, balance, postural control, and ataxia and minimize fall risk.  Equipment: 16x16 w/c, J2 cushion, J2 back, 30" slide board, and leg loops  Reasons for discharge: treatment goals met and discharge from hospital  Patient/family agrees with progress made and goals achieved: Yes  PT Discharge Precautions/Restrictions Precautions Precautions: Fall;Back Precaution Comments: recalls 3/3 back precautions without cuing Restrictions Weight Bearing Restrictions: No Pain Pain Assessment Pain Assessment: 0-10 Pain Score: 3  Pain Type: Neuropathic pain Pain Location: Chest Pain Orientation: Right;Left Pain Descriptors / Indicators: Nagging;Sharp Pain Onset: On-going Pain Intervention(s): Medication (See eMAR) Multiple Pain Sites: No  Cognition Overall Cognitive Status: Within Functional Limits for tasks assessed Arousal/Alertness: Awake/alert Orientation Level: Oriented X4 Memory: Appears intact Awareness: Appears intact Problem Solving: Appears intact Safety/Judgment: Appears intact Sensation Sensation Light Touch: Impaired Detail Light Touch Impaired Details:  Impaired RLE;Impaired LLE Proprioception: Impaired Detail Proprioception Impaired Details: Impaired RLE;Impaired LLE Coordination Gross Motor Movements are Fluid and Coordinated: No Fine Motor Movements are Fluid and Coordinated: Yes Coordination and Movement Description: ataxic in BLE Motor  Motor Motor: Ataxia;Abnormal postural alignment and control;Other (comment) (incomplete paraparesis)  Mobility Bed Mobility Bed Mobility: Sit to Supine;Supine to Sit Transfers Transfers: Yes Locomotion  Ambulation Ambulation: Yes Ambulation/Gait Assistance: Other (comment) (maxisky) Ambulation Distance (Feet): 35 Feet Assistive device: Rolling walker;Body weight support system Wheelchair Mobility Wheelchair Mobility: Yes Wheelchair Assistance: 6: Modified independent (Device/Increase time) Environmental health practitioner: Both upper extremities Distance: 150  Stairs: Yes - Pt req tot A from Maxi-Sky with RW up/down one 4" height step, req assist to place/remove legs and assist to place/remove RW from PT with LOB req tot A from Maxi-Sky to correct.  Trunk/Postural Assessment  Cervical Assessment Cervical Assessment: Within Functional Limits Thoracic Assessment Thoracic Assessment: Exceptions to [facility] (Kyphosis) Lumbar Assessment Lumbar Assessment: Exceptions to [employer] [facility] (Posterior pelvic tilt) Postural Control Postural Control: Deficits on evaluation Trunk Control: impaired due to SCI  Balance Balance Balance Assessed: Yes Static Sitting Balance Static Sitting - Level of Assistance: 5: Stand by assistance Dynamic Sitting Balance Dynamic Sitting - Level of Assistance: 5: Stand by assistance Static Standing Balance Static Standing - Level of Assistance: 4: Min assist Dynamic Standing Balance Dynamic Standing - Level of Assistance: 3: Mod assist Extremity Assessment  RUE Assessment RUE Assessment: Exceptions to [facility] RUE AROM (degrees) Overall AROM Right Upper Extremity: Within functional limits for  tasks performed RUE Strength RUE Overall Strength: Deficits (4/5 overall) LUE Assessment LUE Assessment: Exceptions to WFL (4+/5 overall) RLE Assessment RLE Assessment: Exceptions to [facility] RLE Strength RLE Overall Strength Comments: grossly 3/5 LLE Assessment LLE Assessment: Exceptions to [facility] LLE Strength LLE Overall Strength Comments: grossly 3/5   See Function Navigator for Current Functional Status.  Caitlin E Penven-Crew 03/11/2015, 12:27 PM

## 2015-03-11 NOTE — Progress Notes (Signed)
[facility]ILITATION     PROGRESS NOTE    Subjective/Complaints: Pain under good control. Feeling better about discharge. Still some urinary incontinence  ROS: Pt denies fever, rash/itching, headache, blurred or double vision, nausea, vomiting, abdominal pain, diarrhea, chest pain, shortness of breath, palpitations, dysuria, dizziness, neck or back pain, bleeding, +anxiety intermittently    Objective: Vital Signs: Blood pressure 126/63, pulse 79, temperature 98.6 F (37 C), temperature source Oral, resp. rate 20, height '5\' 4"'$  (1.626 m), weight 54.7 kg (120 lb 9.5 oz), SpO2 98 %. No results found.  Recent Labs  03/09/15 0435  WBC 5.5  HGB 10.7*  HCT 33.1*  PLT 144*    Recent Labs  03/09/15 0435  NA 136  K 4.2  CL 99*  GLUCOSE 183*  BUN 24*  CREATININE 0.68  CALCIUM 9.0   CBG (last 3)  No results for input(s): GLUCAP in the last 72 hours.  Wt Readings from Last 3 Encounters:  02/27/15 54.7 kg (120 lb 9.5 oz)  02/14/15 58.968 kg (130 lb)  02/14/15 58.968 kg (130 lb)    Physical Exam:  Constitutional: She appears well-developed and well-nourished. No distress.  HENT: dentition intact.  Head: Normocephalic and atraumatic.  Mouth/Throat: Oropharynx is clear and moist. No obvious lesions Eyes: Conjunctivae and EOM are normal. Conj WNL Neck: Normal range of motion. Neck supple.  Cardiovascular: Normal rate and regular rhythm.  No murmur heard. Respiratory: Effort normal and breath sounds normal.  GI: Soft. Bowel sounds are normal. She exhibits no distension. There is no tenderness.  Musculoskeletal: She exhibits no tenderness. Trace edema in feet Neurological: She is alert and oriented. Coordination (BLE ataxic) Strength UE grossly 4+/5 with inhibition proximally due to pain. LLE-- hip flexion 3+ to 4-/5, ankle dorsiflexion 4-/5, plantar flexion 4+/5, RLE--- 2/5HF, 2+KE, 3/5 ADF/APF Sensation remains decreased below T5-6, Neuro exam  unchanged 11/2 Skin: Skin is warm and dry. No rash noted. No erythema.  Psych: anxiety  Assessment/Plan: 1. Functional deficits secondary to T5 Cord compression/myelopathy due compression fx, metastatic breast cancer which require 3+ hours per day of interdisciplinary therapy in a comprehensive inpatient rehab setting. Physiatrist is providing close team supervision and 24 hour management of active medical problems listed below. Physiatrist and rehab team continue to assess barriers to discharge/monitor patient progress toward functional and medical goals.  Function:  Bathing Bathing position   Position: Shower  Bathing parts Body parts bathed by patient: Right arm, Left arm, Chest, Abdomen, Right upper leg, Left upper leg, Right lower leg, Left lower leg, Front perineal area, Back Body parts bathed by helper: Back, Front perineal area  Bathing assist Assist Level: Supervision or verbal cues      Upper Body Dressing/Undressing Upper body dressing   What is the patient wearing?: Bra, Pull over shirt/dress Bra - Perfomed by patient: Hook/unhook bra (pull down sports bra), Thread/unthread right bra strap, Thread/unthread left bra strap   Pull over shirt/dress - Perfomed by patient: Thread/unthread right sleeve, Thread/unthread left sleeve, Put head through opening, Pull shirt over trunk          Upper body assist Assist Level: More than reasonable time   Set up : To obtain clothing/put away  Lower Body Dressing/Undressing Lower body dressing   What is the patient wearing?: Pants, Underwear, [employer], Shoes Underwear - Performed by patient: Thread/unthread right underwear leg, Thread/unthread left underwear leg Underwear - Performed by helper: Pull underwear up/down Pants- Performed by patient: Thread/unthread right pants  leg, Thread/unthread left pants leg Pants- Performed by helper: Pull pants up/down Non-skid slipper socks- Performed by patient: Don/doff right sock, Don/doff  left sock Non-skid slipper socks- Performed by helper: Don/doff right sock, Don/doff left sock Socks - Performed by patient: Don/doff right sock, Don/doff left sock Socks - Performed by helper: Don/doff right sock, Don/doff left sock Shoes - Performed by patient: Don/doff right shoe, Don/doff left shoe Shoes - Performed by helper: Don/doff right shoe, Don/doff left shoe     TED Hose - Performed by patient: Don/doff right TED hose TED Hose - Performed by helper: Don/doff right TED hose, Don/doff left TED hose  Lower body assist Assist for lower body dressing: Touching or steadying assistance (Pt > 75%)      Toileting Toileting   Toileting steps completed by patient: Performs perineal hygiene Toileting steps completed by helper: Adjust clothing prior to toileting, Adjust clothing after toileting Toileting Assistive Devices: Grab bar or rail  Toileting assist Assist level: Touching or steadying assistance (Pt.75%)   Transfers Chair/bed transfer   Chair/bed transfer method: Lateral scoot Chair/bed transfer assist level: Touching or steadying assistance (Pt > 75%) Chair/bed transfer assistive device: Mechanical lift Mechanical lift: Stedy   Locomotion Ambulation     Max distance: 30 Assist level: Total assist (Pt < 25%) (maxi sky)   Wheelchair   Type: Manual Max wheelchair distance: 150 Assist Level: No help, No cues, assistive device, takes more than reasonable amount of time  Cognition Comprehension Comprehension assist level: Follows complex conversation/direction with no assist  Expression Expression assist level: Expresses complex ideas: With extra time/assistive device  Social Interaction Social Interaction assist level: Interacts appropriately with others - No medications needed.  Problem Solving Problem solving assist level: Solves basic 90% of the time/requires cueing < 10% of the time  Memory Memory assist level: Recognizes or recalls 90% of the time/requires cueing <  10% of the time   Medical Problem List and Plan: 1. Functional deficits secondary to T5 cord compression/myelopathy from pathologic compression fracture, metastatic breast cancer  -family ed prior to dc---dc tomorrow  -contacted onc/rad-onc about meeting with patient and family to discuss plan per patient's request. There seem to be a lot of questions still from both parties as to plan of care  2. DVT Prophylaxis/Anticoagulation: Pharmaceutical: Lovenox still indicated due to immobility/hypercoaguable state  -would like to send home on lovenox as well---'40mg'$  sq daily 3. Pain Management: . Continue tramadol prn, robaxin prn.   -increased gabapentin to '400mg'$  TID with improvement    -robaxin prn  -consider long acting narc if pain increases  -will not send home on lidoderm patches 4. Adjustment reaction with anxiety/ Mood:   -continue low dose Xanax to help with anxiety attacks.         .  5. Neuropsych: This patient is capable of making decisions on her own behalf. 6. Skin/Wound Care: Routine pressure relief measures. Maintain adequate nutrition and hydration status. 7. Fluids/Electrolytes/Nutrition: Monitor I/O. Encourage po   -Eating 80-100% of meals.   8. ABLA: post op  -no Fe++ supp  -Repeat Hb, 10.7 9. HTN: Monitor BID. Continue aldactone daily and titrate as needed.   -improved control 10. Mild chronic gastritis: Continue Pepcid bid.   -bears watching while on decadron 11. Neurogenic bowel and bladder: emptying bowels.  -voiding fairly well 12. UTI: Ucx +Klebsiella on 10/18. Bactrim completed  -repeat UA+, cx pending, empiric bactrim resumed        LOS (Days) 21 A FACE TO  FACE EVALUATION WAS PERFORMED  Benson Porcaro T 03/11/2015 8:39 AM

## 2015-03-11 NOTE — Progress Notes (Signed)
Occupational Therapy Session Note  Patient Details  Name: Heather [name] MRN: [id number] Date of Birth: Dec 18, 1945  Today's Date: 03/11/2015 OT Individual Time: 0800-0900  And 1130-1200  OT Individual Time Calculation (min): 60 min and 30 min   Short Term Goals: Week 3:  OT Short Term Goal 1 (Week 3): STG=LTG due to LOS  Skilled Therapeutic Interventions/Progress Updates:    Session One: PT seen for OT ADL bathing and dressing session. Pt sitting on toilet upon arrival assist provided for transfer off. Min questioning cues required for w/[name] set-up prior to transfer into shower, completing squat pivot with min A. She showered on 3-[facility] with supervision.  She dressed and completed grooming tasks seated in w/[name] at the sink. [name] to access clothing items in closet. She stood with steadying assist and VCs for weight shift to pull pants/ underwear up- demonstrating fair functional activity tolerance and standing balance.  Pt educated throughout session regarding energy conservation, fall risk, recommendations for home safety, and d/[name] planning.   Session Two: Pt seen for OT session focusing on functational transfers and caregiver training. Pt sitting up in w/[name] upon arrival receiving pain meds from nurse. During session she completed simulated shower transfer to tub transfer bench with husband providing assist with min VCs. She then completed squat pivot transfer from w/[name]> chair with arm rests. [name] to provide safe and adequate assist for sliding board and squat pivot transfers.  Education provided regarding fall risk, what to do if fall occurs, need for assist, recommendation for bathing/ dressing positioning, and d/[name] planning.   Therapy Documentation Precautions:  Precautions Precautions: Fall, Back Precaution Booklet Issued: No Precaution Comments: Recall 3/3 back precautions but requires cues to adhere to functionally Restrictions Weight Bearing  Restrictions: No Pain: Pain Assessment Pain Assessment: 0-10 Pain Score: 3  Pain Type: Neuropathic pain Pain Location: Chest Pain Orientation: Right;Left Pain Descriptors / Indicators: Nagging;Sharp Pain Onset: On-going Pain Intervention(s): Medication (See eMAR), repositioned Multiple Pain Sites: No  See Function Navigator for Current Functional Status.   Therapy/Group: Individual Therapy  Heather [name] 03/11/2015, 7:23 AM

## 2015-03-11 NOTE — Progress Notes (Signed)
Physical Therapy Session Note  Patient Details  Name: Heather [name] MRN: [id number] Date of Birth: November 10, 1945  Today's Date: 03/11/2015 PT Individual Time: 0905-1005 PT Individual Time Calculation (min): 60 min   Short Term Goals: Week 2: STGs=LTGs   Skilled Therapeutic Interventions/Progress Updates:    Pt received in w/c and agreeable to therapy session.  Session focused on gait, coordination, and strengthening. Gait  training in [place] with 3# ankle weights, RW, and +1 for facilitation of weight shift, knee control and balance.  Verbal cues for "soft knee" and postural control.  Squat pivot w/c>mat with min assist for balance and L knee control, slide board transfer mat>w/c with supervision.  PT instructed patient in x15 reps of heel slides, SLR, SAQ, quad sets, and glute sets bilaterally with facilitation for coordination and control and verbal cues for core activation.  Pt transfers sit>supine with mod assist without use of leg loops, and supine>sit with min assist.  Pt returned to room in w/c at end of session and positioned with call bell in reach and needs met.   Therapy Documentation Precautions:  Precautions Precautions: Fall, Back Precaution Booklet Issued: No Precaution Comments: recalls 3/3 back precautions without cuing Restrictions Weight Bearing Restrictions: No   See Function Navigator for Current Functional Status.   Therapy/Group: Individual Therapy  Donnisha Besecker E Penven-Crew 03/11/2015, 10:58 AM

## 2015-03-11 NOTE — Progress Notes (Signed)
Recreational Therapy Discharge Summary Patient Details  Name: CARLINE DURA MRN: [id number] Date of Birth: 1946-01-22 Today's Date: 03/11/2015  Long term goals set: 2  Long term goals met: 2  Comments on progress toward goals: Pt has made great progress during LOS and is ready for discharge home tomorrow.  Pt is discharging at overall set up assist-Mod I level for TR tasks and supervision for community mobility on even surfaces, needing min assist for uneven surfaces.  Education provided on energy conservation, potential activity modifications and overall safety with leisure pursuits.  Reasons for discharge: discharge from hospital  Patient/family agrees with progress made and goals achieved: Yes  Heather [name] 03/11/2015, 8:39 AM

## 2015-03-11 NOTE — Progress Notes (Signed)
Occupational Therapy Discharge Summary  Patient Details  Name: Heather [name] MRN: [id number] Date of Birth: 15-Dec-1945   Patient has met 8 of 9 long term goals due to improved activity tolerance, improved balance, postural control, ability to compensate for deficits and improved coordination.  Patient to discharge at [facility] Assist level.  Patient's care partner is independent to provide the necessary physical assistance at discharge.    Patient's husband has completed family training and have proven ability to provide necessary physical assistance. Recommending pt complete tasks at w/c level, using sliding board for functional transfers. Educated regarding use of lateral leans for toileting/ LB dressing tasks and have also reviewed static standing at fixed location for total A to be provided for standing toileting/ LB dressing tasks if pt is feeling able.  Reasons goals not met: Pt requires min A with functional transfers due to need for assist with blocking of LE during sliding board transfer. Pt with strong LE extension, placing her in posterior lean during transfer, requiring min A to block LE to facilitate upright balance.   Recommendation:  Patient will benefit from ongoing skilled OT services in home health setting to continue to advance functional skills in the area of BADL and Reduce care partner burden.  Equipment: Tub transfer bench and drop arm BSC  Reasons for discharge: treatment goals met and discharge from hospital  Patient/family agrees with progress made and goals achieved: Yes  OT Discharge Precautions/Restrictions  Precautions Precautions: Fall;Back Precaution Comments: Recall 3/3 back precautions but requires cues to adhere during functional tasks Restrictions Weight Bearing Restrictions: No Vision/Perception  Vision- History Baseline Vision/History: Wears glasses Wears Glasses: At all times Patient Visual Report: No change from baseline  Cognition Overall  Cognitive Status: Within Functional Limits for tasks assessed Arousal/Alertness: Awake/alert Orientation Level: Oriented X4 Memory: Appears intact Awareness: Appears intact Problem Solving: Appears intact Safety/Judgment: Appears intact Sensation Sensation Light Touch: Impaired Detail Light Touch Impaired Details: Impaired RLE;Impaired LLE Proprioception: Impaired Detail Proprioception Impaired Details: Impaired RLE;Impaired LLE Coordination Gross Motor Movements are Fluid and Coordinated: No Fine Motor Movements are Fluid and Coordinated: Yes Coordination and Movement Description: ataxic in BLE Motor  Motor Motor: Ataxia;Abnormal postural alignment and control;Other (comment) (Incomplete paraperesis)  Trunk/Postural Assessment  Cervical Assessment Cervical Assessment: Within Functional Limits Thoracic Assessment Thoracic Assessment: Exceptions to [facility] (Kyphosis) Lumbar Assessment Lumbar Assessment: Exceptions to [facility] (Posterior pelvic tilt) Postural Control Postural Control: Deficits on evaluation Trunk Control: impaired due to SCI  Balance Balance Balance Assessed: Yes Static Sitting Balance Static Sitting - Level of Assistance: 5: Stand by assistance Dynamic Sitting Balance Dynamic Sitting - Level of Assistance: 5: Stand by assistance Static Standing Balance Static Standing - Level of Assistance: 4: Min assist Dynamic Standing Balance Dynamic Standing - Level of Assistance: 3: Mod assist Extremity/Trunk Assessment RUE Assessment RUE Assessment: Exceptions to [facility] RUE AROM (degrees) Overall AROM Right Upper Extremity: Within functional limits for tasks performed RUE Strength RUE Overall Strength: Deficits (4/5 overall) LUE Assessment LUE Assessment: Exceptions to WFL (4+/5 overall)   See Function Navigator for Current Functional Status.  Lewis, Eino Whitner C 03/11/2015, 7:35 AM

## 2015-03-11 NOTE — Progress Notes (Signed)
Occupational Therapy Session Note  Patient Details  Name: Heather [name] MRN: [id number] Date of Birth: 07/06/1945  Today's Date: 03/11/2015 OT Individual Time: 1300-1400 OT Individual Time Calculation (min): 60 min    Short Term Goals: Week 2:  OT Short Term Goal 1 (Week 2): Pt will complete 1 step grooming task in standing with steadying assist OT Short Term Goal 1 - Progress (Week 2): Not met OT Short Term Goal 2 (Week 2): Pt will complete functional transfers using squat pivot with supervision OT Short Term Goal 2 - Progress (Week 2): Not met OT Short Term Goal 3 (Week 2): Pt will manage w/c parts independently in prep for functional tasks OT Short Term Goal 3 - Progress (Week 2): Met OT Short Term Goal 4 (Week 2): Pt will complete toileting task with steadying assist OT Short Term Goal 4 - Progress (Week 2): Partly met Week 3:  OT Short Term Goal 1 (Week 3): STG=LTG due to LOS  Skilled Therapeutic Interventions/Progress Updates:    1:1 Pt was on the toilet with the STEDY when arrived.  Focused on transfer using the STEDY off the real commode including sit to stand with grab bar and standing balance while pulling up pants.  Practiced slide board transfers w/c<>bed, bed <>drop arm commode, w/c<> car (at 18 inches) all with setup and A to keep left foot in contact with floor. Pt wanted to also focus on UB strengthening- performed UB strengthening with 1 or 2 lb weighted bar and focus on obtaining and maintaining an anterior pelvic tilt during activity. Performed min A squat pivot transfer to bed and used leg loops to get LEs into bed with supervision (VC).   Therapy Documentation Precautions:  Precautions Precautions: Fall, Back Precaution Booklet Issued: No Precaution Comments: recalls 3/3 back precautions without cuing Restrictions Weight Bearing Restrictions: No Pain: 4-6 throughout session around upper chest region  See Function Navigator for Current Functional  Status.   Therapy/Group: Individual Therapy  Willeen Cass [facility] 03/11/2015, 3:37 PM

## 2015-03-11 NOTE — Progress Notes (Signed)
Social Work Patient ID: Heather [name], female   DOB: 08/04/1945, 69 y.o.   MRN: [id number]   Have reviewed team conference with pt and husband.  Both feeling ready for d/c tomorrow.  Have arranged [place] follow up and DME.  Continue to follow.  Jonhatan Hearty, LCSW

## 2015-03-12 DIAGNOSIS — D696 Thrombocytopenia, unspecified: Secondary | ICD-10-CM | POA: Diagnosis not present

## 2015-03-12 DIAGNOSIS — R7989 Other specified abnormal findings of blood chemistry: Secondary | ICD-10-CM

## 2015-03-12 LAB — URINE CULTURE: Culture: >=100000

## 2015-03-12 LAB — CBC
HCT: 33.9 % — ABNORMAL LOW (ref 36.0–46.0)
Hemoglobin: 11.4 g/dL — ABNORMAL LOW (ref 12.0–15.0)
MCH: 29.6 pg (ref 26.0–34.0)
MCHC: 33.6 g/dL (ref 30.0–36.0)
MCV: 88.1 fL (ref 78.0–100.0)
Platelets: 124 10*3/uL — ABNORMAL LOW (ref 150–400)
RBC: 3.85 MIL/uL — ABNORMAL LOW (ref 3.87–5.11)
RDW: 16.6 % — ABNORMAL HIGH (ref 11.5–15.5)
WBC: 4.5 10*3/uL (ref 4.0–10.5)

## 2015-03-12 MED ORDER — APIXABAN 2.5 MG PO TABS
2.5000 mg | ORAL_TABLET | Freq: Two times a day (BID) | ORAL | Status: DC
  Administered 2015-03-12: 2.5 mg via ORAL
  Filled 2015-03-12: qty 1

## 2015-03-12 MED ORDER — APIXABAN 2.5 MG PO TABS: 2.5000 mg | ORAL_TABLET | Freq: Two times a day (BID) | ORAL | Status: DC

## 2015-03-12 NOTE — Discharge Instructions (Signed)
Inpatient Rehab Discharge Instructions  Heather [name] Discharge date and time:  03/12/15  Activities/Precautions/ Functional Status: Activity: no lifting, driving, or strenuous exercise for till cleared by MD Diet: regular diet Eat small healthy snacks during the day. Drink plenty of fluids.  Wound Care: keep wound clean and dry   Functional status:  ___ No restrictions     ___ Walk up steps independently _X__ 24/7 supervision/assistance   ___ Walk up steps with assistance ___ Intermittent supervision/assistance  ___ Bathe/dress independently ___ Walk with walker     _X__ Bathe/dress with assistance ___ Walk Independently    ___ Shower independently ___ Walk with assistance    ___ Shower with assistance _X__ No alcohol     ___ Return to work/school ________      COMMUNITY REFERRALS UPON DISCHARGE:    Home Health:   PT     OT     RN                 Agency:  [facility] Phone: [phone]   Medical Equipment/Items Ordered:  Wheelchair, cushion, drop arm commode, tub bench, transfer board                                                       Agency/Supplier:  [facility] [place] PATIENT/FAMILY:  Support Groups:  [place]     Special Instructions: 1. Contact MD if you notice any signs of bleeding.  2. Use probiotics--"Florastor" or "Kefir".    3. Use Biotene mouthwash 2-3 times a day.    My questions have been answered and I understand these instructions. I will adhere to these goals and the provided educational materials after my discharge from the hospital.  Patient/Caregiver Signature _______________________________ Date __________  Clinician Signature _______________________________________ Date __________    Apixaban oral tablets What is this medicine? APIXABAN (a PIX a ban) is an anticoagulant (blood thinner). It is used to lower the chance of stroke in people with a medical condition called atrial  fibrillation. It is also used to treat or prevent blood clots in the lungs or in the veins. This medicine may be used for other purposes; ask your health care provider or pharmacist if you have questions. What should I tell my health care provider before I take this medicine? They need to know if you have any of these conditions: -bleeding disorders -bleeding in the brain -blood in your stools (black or tarry stools) or if you have blood in your vomit -history of stomach bleeding -kidney disease -liver disease -mechanical heart valve -an unusual or allergic reaction to apixaban, other medicines, foods, dyes, or preservatives -pregnant or trying to get pregnant -breast-feeding How should I use this medicine? Take this medicine by mouth with a glass of water. Follow the directions on the prescription label. You can take it with or without food. If it upsets your stomach, take it with food. Take your medicine at regular intervals. Do not take it more often than directed. Do not stop taking except on your doctor's advice. Stopping this medicine may increase your risk of a blot clot. Be sure to refill your prescription before you run out of medicine. Talk to your pediatrician regarding the use of this medicine in children. Special care may be  needed. Overdosage: If you think you have taken too much of this medicine contact a poison control center or emergency room at once. NOTE: This medicine is only for you. Do not share this medicine with others. What if I miss a dose? If you miss a dose, take it as soon as you can. If it is almost time for your next dose, take only that dose. Do not take double or extra doses. What may interact with this medicine? This medicine may interact with the following: -aspirin and aspirin-like medicines -certain medicines for fungal infections like ketoconazole and itraconazole -certain medicines for seizures like carbamazepine and phenytoin -certain medicines that  treat or prevent blood clots like warfarin, enoxaparin, and dalteparin -clarithromycin -NSAIDs, medicines for pain and inflammation, like ibuprofen or naproxen -rifampin -ritonavir -St. John's wort This list may not describe all possible interactions. Give your health care provider a list of all the medicines, herbs, non-prescription drugs, or dietary supplements you use. Also tell them if you smoke, drink alcohol, or use illegal drugs. Some items may interact with your medicine. What should I watch for while using this medicine? Notify your doctor or health care professional and seek emergency treatment if you develop breathing problems; changes in vision; chest pain; severe, sudden headache; pain, swelling, warmth in the leg; trouble speaking; sudden numbness or weakness of the face, arm, or leg. These can be signs that your condition has gotten worse. If you are going to have surgery, tell your doctor or health care professional that you are taking this medicine. Tell your health care professional that you use this medicine before you have a spinal or epidural procedure. Sometimes people who take this medicine have bleeding problems around the spine when they have a spinal or epidural procedure. This bleeding is very rare. If you have a spinal or epidural procedure while on this medicine, call your health care professional immediately if you have back pain, numbness or tingling (especially in your legs and feet), muscle weakness, paralysis, or loss of bladder or bowel control. Avoid sports and activities that might cause injury while you are using this medicine. Severe falls or injuries can cause unseen bleeding. Be careful when using sharp tools or knives. Consider using an Copy. Take special care brushing or flossing your teeth. Report any injuries, bruising, or red spots on the skin to your doctor or health care professional. What side effects may I notice from receiving this  medicine? Side effects that you should report to your doctor or health care professional as soon as possible: -allergic reactions like skin rash, itching or hives, swelling of the face, lips, or tongue -signs and symptoms of bleeding such as bloody or black, tarry stools; red or dark-brown urine; spitting up blood or brown material that looks like coffee grounds; red spots on the skin; unusual bruising or bleeding from the eye, gums, or nose This list may not describe all possible side effects. Call your doctor for medical advice about side effects. You may report side effects to FDA at 1-800-FDA-1088. Where should I keep my medicine? Keep out of the reach of children. Store at room temperature between 20 and 25 degrees C (68 and 77 degrees F). Throw away any unused medicine after the expiration date. NOTE: This sheet is a summary. It may not cover all possible information. If you have questions about this medicine, talk to your doctor, pharmacist, or health care provider.    2016, [employer]/Gold Standard. (2012-12-28 11:59:24)  Please bring  this form and your medication list with you to all your follow-up doctor's appointments.

## 2015-03-12 NOTE — Progress Notes (Signed)
Patient and husband received discharge instructions from Algis Liming, PA-C with verbal understanding. Patient discharged to home with husband and belongings. Patient declined Flu vaccination.

## 2015-03-12 NOTE — Progress Notes (Addendum)
[facility]     PROGRESS NOTE    Subjective/Complaints: Concerned about cost of lovenox injections. Still with urinary symptoms. Asked about cx results.  ROS: Pt denies fever, rash/itching, headache, blurred or double vision, nausea, vomiting, abdominal pain, diarrhea, chest pain, shortness of breath, palpitations, dysuria, dizziness, neck or back pain, bleeding, +anxiety intermittently    Objective: Vital Signs: Blood pressure 106/57, pulse 68, temperature 98.1 F (36.7 C), temperature source Oral, resp. rate 16, height '5\' 4"'$  (1.626 m), weight 59.467 kg (131 lb 1.6 oz), SpO2 100 %. No results found.  Recent Labs  03/11/15 1254 03/12/15 0607  WBC 7.2 4.5  HGB 12.6 11.4*  HCT 37.7 33.9*  PLT 135* 124*   No results for input(s): NA, K, CL, GLUCOSE, BUN, CREATININE, CALCIUM in the last 72 hours.  Invalid input(s): CO CBG (last 3)  No results for input(s): GLUCAP in the last 72 hours.  Wt Readings from Last 3 Encounters:  03/11/15 59.467 kg (131 lb 1.6 oz)  02/14/15 58.968 kg (130 lb)  02/14/15 58.968 kg (130 lb)    Physical Exam:  Constitutional: She appears well-developed and well-nourished. No distress.  HENT: dentition intact.  Head: Normocephalic and atraumatic.  Mouth/Throat: Oropharynx is clear and moist. No obvious lesions Eyes: Conjunctivae and EOM are normal. Conj WNL Neck: Normal range of motion. Neck supple.  Cardiovascular: Normal rate and regular rhythm.  No murmur heard. Respiratory: Effort normal and breath sounds normal.  GI: Soft. Bowel sounds are normal. She exhibits no distension. There is no tenderness.  Musculoskeletal: She exhibits no tenderness. Trace edema in feet Neurological: She is alert and oriented. Coordination (BLE ataxic) Strength UE grossly 4+/5 with inhibition proximally due to pain. LLE-- hip flexion 3+ to 4-/5, ankle dorsiflexion 4-/5, plantar flexion 4+/5, RLE--- 2/5HF, 2+KE, 3/5  ADF/APF Sensation remains decreased below T5-6, Neuro exam unchanged 11/2 Skin: Skin is warm and dry. No rash noted. No erythema.  Psych: anxiety  Assessment/Plan: 1. Functional deficits secondary to T5 Cord compression/myelopathy due compression fx, metastatic breast cancer which require 3+ hours per day of interdisciplinary therapy in a comprehensive inpatient rehab setting. Physiatrist is providing close team supervision and 24 hour management of active medical problems listed below. Physiatrist and rehab team continue to assess barriers to discharge/monitor patient progress toward functional and medical goals.  Function:  Bathing Bathing position   Position: Shower  Bathing parts Body parts bathed by patient: Right arm, Left arm, Chest, Abdomen, Right upper leg, Left upper leg, Right lower leg, Left lower leg, Front perineal area, Back Body parts bathed by helper: Back, Front perineal area  Bathing assist Assist Level: Supervision or verbal cues      Upper Body Dressing/Undressing Upper body dressing   What is the patient wearing?: Bra, Pull over shirt/dress Bra - Perfomed by patient: Hook/unhook bra (pull down sports bra), Thread/unthread right bra strap, Thread/unthread left bra strap   Pull over shirt/dress - Perfomed by patient: Thread/unthread right sleeve, Thread/unthread left sleeve, Put head through opening, Pull shirt over trunk          Upper body assist Assist Level: More than reasonable time   Set up : To obtain clothing/put away  Lower Body Dressing/Undressing Lower body dressing   What is the patient wearing?: Pants, Underwear, [employer], Shoes Underwear - Performed by patient: Thread/unthread right underwear leg, Thread/unthread left underwear leg, Pull underwear up/down Underwear - Performed by helper: Pull underwear up/down Pants- Performed by patient: Thread/unthread  right pants leg, Thread/unthread left pants leg, Pull pants up/down Pants- Performed by  helper: Pull pants up/down Non-skid slipper socks- Performed by patient: Don/doff right sock, Don/doff left sock Non-skid slipper socks- Performed by helper: Don/doff right sock, Don/doff left sock Socks - Performed by patient: Don/doff right sock, Don/doff left sock Socks - Performed by helper: Don/doff right sock, Don/doff left sock Shoes - Performed by patient: Don/doff right shoe, Don/doff left shoe Shoes - Performed by helper: Don/doff right shoe, Don/doff left shoe     TED Hose - Performed by patient: Don/doff right TED hose, Don/doff left TED hose TED Hose - Performed by helper: Don/doff right TED hose, Don/doff left TED hose  Lower body assist Assist for lower body dressing: Touching or steadying assistance (Pt > 75%)      Toileting Toileting   Toileting steps completed by patient: Adjust clothing prior to toileting, Performs perineal hygiene, Adjust clothing after toileting Toileting steps completed by helper: Adjust clothing prior to toileting, Adjust clothing after toileting Toileting Assistive Devices: Grab bar or rail  Toileting assist Assist level: Touching or steadying assistance (Pt.75%)   Transfers Chair/bed transfer   Chair/bed transfer method: Lateral scoot Chair/bed transfer assist level: Touching or steadying assistance (Pt > 75%) Chair/bed transfer assistive device: Armrests, Sliding board Mechanical lift: Stedy   Locomotion Ambulation     Max distance: 30 Assist level: Total assist (Pt < 25%) (maxisky)   Wheelchair   Type: Manual Max wheelchair distance: 150' x 2 reps Assist Level: No help, No cues, assistive device, takes more than reasonable amount of time  Cognition Comprehension Comprehension assist level: Follows complex conversation/direction with no assist  Expression Expression assist level: Expresses complex ideas: With extra time/assistive device  Social Interaction Social Interaction assist level: Interacts appropriately with others - No  medications needed.  Problem Solving Problem solving assist level: Solves basic 90% of the time/requires cueing < 10% of the time  Memory Memory assist level: Recognizes or recalls 90% of the time/requires cueing < 10% of the time   Medical Problem List and Plan: 1. Functional deficits secondary to T5 cord compression/myelopathy from pathologic compression fracture, metastatic breast cancer  -home today, aggressive onc/rad-onc follow up  -i'll see in the office in a month  2. DVT Prophylaxis/Anticoagulation: Pharmaceutical: will try eliquis (pt assist program)---for at least a month  -would like to send home on lovenox as well---'40mg'$  sq daily 3. Pain Management: . Continue tramadol prn, robaxin prn.   -increased gabapentin to '400mg'$  TID with improvement    -robaxin prn  -consider long acting narc if pain increases  -will not send home on lidoderm patches 4. Adjustment reaction with anxiety/ Mood:   -continue low dose Xanax to help with anxiety attacks.         .  5. Neuropsych: This patient is capable of making decisions on her own behalf. 6. Skin/Wound Care: Routine pressure relief measures. Maintain adequate nutrition and hydration status. 7. Fluids/Electrolytes/Nutrition: Monitor I/O. Encourage po   -Eating 80-100% of meals.   8. ABLA/onc: post op  -no Fe++ supp  -Repeat Hb, 10.7  -platelets down to 124k  -recheck once home and per h/o---lovenox vs onc vs uti effect  9. HTN: Monitor BID. Continue aldactone daily and titrate as needed.   -improved control 10. Mild chronic gastritis: Continue Pepcid bid.   -bears watching while on decadron 11. Neurogenic bowel and bladder: emptying bowels.  -voiding fairly well 12. UTI: Ucx +Klebsiella on 10/18. Bactrim completed  -repeat  UA+, cx still pending, empiric bactrim resumed        LOS (Days) 22 A FACE TO FACE EVALUATION WAS PERFORMED  Danel Requena T 03/12/2015 8:32 AM

## 2015-03-12 NOTE — Discharge Summary (Signed)
Physician Discharge Summary  Patient ID: Heather [name] MRN: [id number] DOB/AGE: 1946/01/04 69 y.o.  Admit date: 02/18/2015 Discharge date: 03/12/2015  Discharge Diagnoses:  Principal Problem:   Paraplegia at T4 level [facility]) Active Problems:   Primary cancer of upper outer quadrant of right female breast ([place])   Spinal cord compression due to malignant neoplasm metastatic to spine (HCC)   Postoperative anemia due to acute blood loss   Neurogenic bowel   Neurogenic bladder   Pain   Recurrent UTI--due to Klebsiella   Thrombocytopenia (HCC)   Prerenal azotemia   Discharged Condition: Stable   Significant Diagnostic Studies:  Mr Thoracic Spine W Wo Contrast  03/05/2015  CLINICAL DATA:  Weakness in the lower extremities. Functional deficit secondary to T5 cord compression and myelopathy from pathologic compression fracture and metastatic breast cancer. Recent laminectomy. EXAM: MRI THORACIC SPINE WITHOUT AND WITH CONTRAST TECHNIQUE: Multiplanar and multiecho pulse sequences of the thoracic spine, to include the craniocervical junction and cervicothoracic junction, were obtained without and with intravenous contrast. CONTRAST:  89m MULTIHANCE GADOBENATE DIMEGLUMINE 529 MG/ML IV SOLN COMPARISON:  MRI of the thoracic spine 02/14/2015. FINDINGS: Laminectomy is noted at T5. There is posterior spinous stabilization with pedicle screw and rod fixation at T3, T4, and T6 bilaterally. There is residual enhancing tumor posterior to the spinal cord at the T5 level. There some decompression. Cord is still narrowed to 3.5 mm at its minimal diameter. There is extensive tumor extending into the posterior elements bilaterally, right greater than left. No other osseous lesions are present. There is tumor within the foramen on the right at T4-5 and to lesser extent at T5-6. Exaggerated kyphosis is again noted at T5, similar to the prior exam. Slight anterolisthesis of T4 on T6 is noted. The remaining vertebral  body heights and alignment maintained. The foramina are patent throughout the remainder of the thoracic spine. IMPRESSION: 1. T5 laminectomy with partial decompression of the spinal canal. 2. There is residual enhancing tissue posterior to the spinal cord which narrows the cord to 3.5 mm at the level plaque sickle mole compression. 3. Residual enhancing tissue in the right foramen at T4-5 and T5-6 likely representing tumor. 4. Posterior stabilization hardware T3-7. 5. The lower thoracic spine is unremarkable. Electronically Signed   By: CSan MorelleM.D.   On: 03/05/2015 14:10    Labs:   Basic Metabolic Panel: BMP Latest Ref Rng 03/09/2015 02/26/2015 02/24/2015  Glucose 65 - 99 mg/dL 183(H) 165(H) 166(H)  BUN 6 - 20 mg/dL 24(H) 30(H) 32(H)  Creatinine 0.44 - 1.00 mg/dL 0.68 0.96 0.77  Sodium 135 - 145 mmol/L 136 136 133(L)  Potassium 3.5 - 5.1 mmol/L 4.2 5.1 4.4  Chloride 101 - 111 mmol/L 99(L) 98(L) 93(L)  CO2 22 - 32 mmol/L '29 29 28  '$ Calcium 8.9 - 10.3 mg/dL 9.0 9.2 9.0     CBC:  Recent Labs Lab 03/09/15 0435 03/11/15 1254 03/12/15 0607  WBC 5.5 7.2 4.5  HGB 10.7* 12.6 11.4*  HCT 33.1* 37.7 33.9*  MCV 89.5 89.3 88.1  PLT 144* 135* 124*    CBG: No results for input(s): GLUCAP in the last 168 hours.  Brief HPI:   Heather BODINis a 69y.o. right handed female with history of HTN, CKD, recent gastritis, R-breast cancer s/p mastectomy, with chemo/XRT 2013 with neuropathy and adjuvant therapy with letrozole. She was admitted on 02/14/2015 with reports of numbness, chest pain, and one week history of BLE weakness, balance problems progressing to  inability to walk, difficulty voiding and constipation. MRI brain without evidence of metastatic disease. MRI C-T-L-spine with large destructive T5 lesion with severe pathologic compression and extensive epidural tumor as well as indeterminate scattered foci of enhancement in thoracic and lumbar spine. She was evaluated by Dr.  Kathyrn Sheriff who recommended decompressive surgery. She was started on decadron and underwent T5 laminectomy with decompression and T3-T5 screw stabilization on 02/16/15. Pathology positive for metastatic cancer due to breast primary. She improvement in BLE strength and therapy was ongoing. CIR was recommended for follow up therapy. Marland Kitchen    Hospital Course: Heather [name] was admitted to rehab 02/18/2015 for inpatient therapies to consist of PT and OT at least three hours five days a week. Past admission physiatrist, therapy team and rehab RN have worked together to provide customized collaborative inpatient rehab. She has had discomfort due to T5 radiculopathy with band like pain as well as numbness BLE. Gabapentin was added to help with symptoms and was slowly titrated upwards to avoid side effects. foley was removed past admission and she was started on bladder program.  She was treated for Kleb pneumoniae UTI and is currently voiding without retention.  She did develop recurrent issues with dysuria and with recurrent Kleb pneu UTI on 11/01. She was started on septra DS and is to continue on this for 7 day treatment.  She was started on bowel program but has had issues with severe constipation due to refusal of laxatives. She has been educated on compliance with daily bowel program after discharge.      BLE dopplers were negative for DVT and SQ lovenox was used for DVT prophylaxis.  She was started on slow decadron taper and has had resolution of leucocytosis.  Follow CBC shows H/H is stable but she has had drop in platelets and lovenox was changed to Eliquis. She is to continue on this for at least a month due to BLE weakness with decrease in mobility and high risk for DVT due to CA. She has been limited by back pain and was educated on use of pain medication to help manage pain and help with activity tolerance. Follow up MRI of spine shows residual tumor with some decompression of spinal cord and no new  changes. She has had high levels of anxiety and team has worked together to provide extensive ego support. She has progressed to supervision to min assist at wheel chair level and  [facility] to provide [place] [place] and [place] after discharge.    Rehab course: During patient's stay in rehab weekly team conferences were held to monitor patient's progress, set goals and discuss barriers to discharge. At admission, patient required max assist with basic self care tasks, moderate assist for transfers and +2 assist with gait. She has had improvement in activity tolerance, balance, postural control, as well as ability to compensate for deficits. She is has had improvement in functional use RLE/LLE as well as improved awareness.she requires supervision for bathing and upper body dressing and min assist with lower body dressing. She requires min assist for sliding board transfers and static standing balance.  She is able to ambulate 43' with maxisky.     Disposition: 01-Home or Self Care  Diet: Regular  Special Instructions: 1. Contact MD if you notice any signs of bleeding.  2. Use probiotics--"Florastor" or "Kefir".    3. Use Biotene mouthwash 2-3 times a day.       Medication List    STOP taking these  medications        oxyCODONE-acetaminophen 5-325 MG tablet  Commonly known as:  PERCOCET/ROXICET      TAKE these medications        apixaban 2.5 MG Tabs tablet  Commonly known as:  ELIQUIS  Take 1 tablet (2.5 mg total) by mouth 2 (two) times daily.     dexamethasone 4 MG tablet  Commonly known as:  DECADRON  Take 1 tablet (4 mg total) by mouth 2 (two) times daily.     gabapentin 400 MG capsule  Commonly known as:  NEURONTIN  Take 1 capsule (400 mg total) by mouth 3 (three) times daily.     letrozole 2.5 MG tablet  Commonly known as:  FEMARA  Take 1 tablet (2.5 mg total) by mouth daily.     lidocaine 5 %  Commonly known as:  LIDODERM  Apply to thoracic area at 7 am and remove  at 7 pm daily.     methocarbamol 500 MG tablet  Commonly known as:  ROBAXIN  Take 1 tablet (500 mg total) by mouth daily as needed for muscle spasms.     polyethylene glycol packet  Commonly known as:  MIRALAX / GLYCOLAX  Take 17 g by mouth 2 (two) times daily.     pravastatin 10 MG tablet  Commonly known as:  PRAVACHOL  Take 10 mg by mouth daily.     PROBIOTIC DAILY PO  Take 1 capsule by mouth daily.     ranitidine 300 MG tablet  Commonly known as:  ZANTAC  Take 300 mg by mouth daily.     spironolactone 25 MG tablet  Commonly known as:  ALDACTONE  Take 0.5 tablets (12.5 mg total) by mouth daily.     sulfamethoxazole-trimethoprim 800-160 MG tablet  Commonly known as:  BACTRIM DS,SEPTRA DS  Take 1 tablet by mouth 2 (two) times daily.     traMADol 50 MG tablet--Rx # 90 pills   Commonly known as:  ULTRAM  Take 1 tablet (50 mg total) by mouth every 6 (six) hours as needed for moderate pain.       Follow-up Information    Follow up with Meredith Staggers, MD On 04/06/2015.   Specialty:  Physical Medicine and Rehabilitation   Why:  Be there at 2:40 for 3 pm  appointment   Contact information:   [address], [address] [place] [address] [phone]       Follow up with Jairo Ben, MD. Call today.   Specialty:  Neurosurgery   Why:  for follow up appointment   Contact information:   [address] [place] [address] [place] [address] [phone]       Follow up with Rulon Eisenmenger, MD On 03/16/2015.   Specialty:  Hematology and Oncology   Why:  Appointment at 9:45   Contact information:   [place] [address] [phone]       Follow up with Tyler Pita, MD On 03/16/2015.   Specialty:  Radiation Oncology   Why:  appointment at 8 am with nurse/8:30 with MD   Contact information:   [place] [address] [phone]       Follow up with Vidal Schwalbe, MD On 03/19/2015.   Specialty:  Family  Medicine   Why:  @ 12:00 pm for post hospital follow up. Check urine culture and CBC.    Contact information:   [place]  [place] [address] [phone]       Signed: Bary Leriche 03/17/2015, 5:44 PM

## 2015-03-12 NOTE — Progress Notes (Signed)
Social Work  Discharge Note  The overall goal for the admission was met for:   Discharge location: Yes - home with spouse who can provide 24/7 assistance  Length of Stay: Yes - 22 days  Discharge activity level: Yes - minimal assistance w/c level  Home/community participation: Yes  Services provided included: MD, RD, PT, OT, RN, TR, Pharmacy and [place]: Medicare and Private Insurance: [place]  Follow-up services arranged: Home Health: Therapist, sports, PT, OT via [facility], DME: 16x16 lightweight w/c with ELRs/ back cushion/Jay 2 seat cushion,  drop arm commode, tub bench and transfer board via [place] and Patient/Family has no preference for HH/DME agencies  Comments (or additional information):  Patient/Family verbalized understanding of follow-up arrangements: Yes  Individual responsible for coordination of the follow-up plan: pt  Confirmed correct DME delivered: Brittane Grudzinski 03/12/2015    Elain Wixon

## 2015-03-12 NOTE — Progress Notes (Signed)
Patient c/o of constipation and unable to move bowels. Administered Fleets enema at 0910 with minimal results. Patient requested RN to manually disimpact patient. RN disimpacted large amount of hard balls from patient. Patient stated to RN that she "felt better." Will continue to monitor patient.

## 2015-03-13 ENCOUNTER — Other Ambulatory Visit: Payer: Self-pay

## 2015-03-13 ENCOUNTER — Telehealth: Payer: Self-pay | Admitting: Hematology and Oncology

## 2015-03-13 ENCOUNTER — Ambulatory Visit (HOSPITAL_COMMUNITY): Admit: 2015-03-13 | Payer: Medicare Other

## 2015-03-13 NOTE — Telephone Encounter (Signed)
lvm fo rpt regarding to 11.7 cx and moved to 11.10 after the pet

## 2015-03-14 DIAGNOSIS — C50411 Malignant neoplasm of upper-outer quadrant of right female breast: Secondary | ICD-10-CM | POA: Diagnosis not present

## 2015-03-14 DIAGNOSIS — C7951 Secondary malignant neoplasm of bone: Secondary | ICD-10-CM | POA: Diagnosis not present

## 2015-03-14 DIAGNOSIS — M8458XD Pathological fracture in neoplastic disease, other specified site, subsequent encounter for fracture with routine healing: Secondary | ICD-10-CM | POA: Diagnosis not present

## 2015-03-14 DIAGNOSIS — I129 Hypertensive chronic kidney disease with stage 1 through stage 4 chronic kidney disease, or unspecified chronic kidney disease: Secondary | ICD-10-CM | POA: Diagnosis not present

## 2015-03-14 DIAGNOSIS — N390 Urinary tract infection, site not specified: Secondary | ICD-10-CM | POA: Diagnosis not present

## 2015-03-14 DIAGNOSIS — G8222 Paraplegia, incomplete: Secondary | ICD-10-CM | POA: Diagnosis not present

## 2015-03-16 ENCOUNTER — Ambulatory Visit: Payer: Medicare Other | Admitting: Hematology and Oncology

## 2015-03-16 ENCOUNTER — Ambulatory Visit: Payer: Medicare Other

## 2015-03-16 ENCOUNTER — Ambulatory Visit: Payer: Medicare Other | Admitting: Radiation Oncology

## 2015-03-16 DIAGNOSIS — C50411 Malignant neoplasm of upper-outer quadrant of right female breast: Secondary | ICD-10-CM | POA: Diagnosis not present

## 2015-03-16 DIAGNOSIS — D649 Anemia, unspecified: Secondary | ICD-10-CM | POA: Diagnosis not present

## 2015-03-16 DIAGNOSIS — N390 Urinary tract infection, site not specified: Secondary | ICD-10-CM | POA: Diagnosis not present

## 2015-03-16 DIAGNOSIS — C7951 Secondary malignant neoplasm of bone: Secondary | ICD-10-CM | POA: Diagnosis not present

## 2015-03-16 DIAGNOSIS — I129 Hypertensive chronic kidney disease with stage 1 through stage 4 chronic kidney disease, or unspecified chronic kidney disease: Secondary | ICD-10-CM | POA: Diagnosis not present

## 2015-03-16 DIAGNOSIS — M8458XD Pathological fracture in neoplastic disease, other specified site, subsequent encounter for fracture with routine healing: Secondary | ICD-10-CM | POA: Diagnosis not present

## 2015-03-16 DIAGNOSIS — G8222 Paraplegia, incomplete: Secondary | ICD-10-CM | POA: Diagnosis not present

## 2015-03-17 ENCOUNTER — Encounter: Payer: Self-pay | Admitting: Radiation Oncology

## 2015-03-17 DIAGNOSIS — N390 Urinary tract infection, site not specified: Secondary | ICD-10-CM | POA: Diagnosis not present

## 2015-03-17 DIAGNOSIS — G8222 Paraplegia, incomplete: Secondary | ICD-10-CM | POA: Diagnosis not present

## 2015-03-17 DIAGNOSIS — C7951 Secondary malignant neoplasm of bone: Secondary | ICD-10-CM | POA: Diagnosis not present

## 2015-03-17 DIAGNOSIS — I129 Hypertensive chronic kidney disease with stage 1 through stage 4 chronic kidney disease, or unspecified chronic kidney disease: Secondary | ICD-10-CM | POA: Diagnosis not present

## 2015-03-17 DIAGNOSIS — C50411 Malignant neoplasm of upper-outer quadrant of right female breast: Secondary | ICD-10-CM | POA: Diagnosis not present

## 2015-03-17 DIAGNOSIS — M8458XD Pathological fracture in neoplastic disease, other specified site, subsequent encounter for fracture with routine healing: Secondary | ICD-10-CM | POA: Diagnosis not present

## 2015-03-17 NOTE — Progress Notes (Signed)
Histology and Location of Primary Cancer: invasive lobular carcinoma of right breast   Sites of Visceral and Bony Metastatic Disease: thoracic spine   Location(s) of Symptomatic Metastases: T5  03/08/2012 Initial Diagnosis Right breast invasive ductal carcinoma ER positive PR negative HER-2 positive Ki-67 44%; another breast mass biopsied in the anterior part of the breast which was also positive for malignancy that was HER-2 negative    05/10/2012 Surgery Right mastectomy and axillary lymph node dissection: Multifocal disease 5 cm, 1.7 cm, 1.6 cm, ER positive PR negative HER-2 positive Ki-67 44%, 3/7 lymph nodes positive   06/14/2012 - 06/12/2013 Chemotherapy Adjuvant chemotherapy with [place] 6 followed by Herceptin maintenance   10/25/2012 - 12/11/2012 Radiation Therapy Adjuvant radiation therapy   02/07/2013 -  Anti-estrogen oral therapy Letrozole 2.5 mg daily         Pain on a scale of 0-10 is:    If Spine Met(s), symptoms, if any, include:  Bowel/Bladder retention or incontinence (please describe): constipation  Numbness or weakness in extremities (please describe):numbness from the waist down  Current Decadron regimen, if applicable: decadron 4 mg bid  Ambulatory status? Walker? Wheelchair?: wheelchair  SAFETY ISSUES:  Prior radiation? yes  Pacemaker/ICD? no  Possible current pregnancy? no  Is the patient on methotrexate? no  Current Complaints / other details:  69 year old female. Patient underwent decompression by neurosurgery on 02/16/15. PT comes to home three times per week. OT attempting to schedule visits. Scheduled to follow up with surgeon next Wednesday. Lives one hour away.

## 2015-03-18 ENCOUNTER — Other Ambulatory Visit: Payer: Self-pay

## 2015-03-18 ENCOUNTER — Encounter (HOSPITAL_COMMUNITY)
Admission: RE | Admit: 2015-03-18 | Discharge: 2015-03-18 | Disposition: A | Discharge status: Home / Self Care | Payer: Medicare Other | Source: Ambulatory Visit | Attending: Radiation Oncology | Admitting: Radiation Oncology

## 2015-03-18 DIAGNOSIS — C50411 Malignant neoplasm of upper-outer quadrant of right female breast: Secondary | ICD-10-CM

## 2015-03-18 DIAGNOSIS — Z79899 Other long term (current) drug therapy: Secondary | ICD-10-CM | POA: Insufficient documentation

## 2015-03-18 DIAGNOSIS — D492 Neoplasm of unspecified behavior of bone, soft tissue, and skin: Secondary | ICD-10-CM | POA: Insufficient documentation

## 2015-03-18 DIAGNOSIS — C50919 Malignant neoplasm of unspecified site of unspecified female breast: Secondary | ICD-10-CM | POA: Diagnosis not present

## 2015-03-18 DIAGNOSIS — C50911 Malignant neoplasm of unspecified site of right female breast: Secondary | ICD-10-CM

## 2015-03-18 DIAGNOSIS — N390 Urinary tract infection, site not specified: Secondary | ICD-10-CM

## 2015-03-18 DIAGNOSIS — C7951 Secondary malignant neoplasm of bone: Secondary | ICD-10-CM

## 2015-03-18 MED ORDER — FLUDEOXYGLUCOSE F - 18 (FDG) INJECTION
6.8300 | Freq: Once | INTRAVENOUS | Status: DC | PRN
  Administered 2015-03-18: 6.83 via INTRAVENOUS
  Filled 2015-03-18: qty 6.83

## 2015-03-19 ENCOUNTER — Ambulatory Visit
Admission: RE | Admit: 2015-03-19 | Discharge: 2015-03-19 | Disposition: A | Discharge status: Home / Self Care | Payer: Medicare Other | Source: Ambulatory Visit | Attending: Radiation Oncology | Admitting: Radiation Oncology

## 2015-03-19 ENCOUNTER — Other Ambulatory Visit (HOSPITAL_BASED_OUTPATIENT_CLINIC_OR_DEPARTMENT_OTHER): Payer: Medicare Other

## 2015-03-19 ENCOUNTER — Encounter: Payer: Self-pay | Admitting: Hematology and Oncology

## 2015-03-19 ENCOUNTER — Ambulatory Visit (HOSPITAL_BASED_OUTPATIENT_CLINIC_OR_DEPARTMENT_OTHER): Payer: Medicare Other | Admitting: Hematology and Oncology

## 2015-03-19 VITALS — BP 135/74 | HR 83 | Temp 98.1°F | Resp 18 | Ht 64.0 in | Wt 131.0 lb

## 2015-03-19 DIAGNOSIS — Z79811 Long term (current) use of aromatase inhibitors: Secondary | ICD-10-CM | POA: Diagnosis not present

## 2015-03-19 DIAGNOSIS — F419 Anxiety disorder, unspecified: Secondary | ICD-10-CM | POA: Insufficient documentation

## 2015-03-19 DIAGNOSIS — N390 Urinary tract infection, site not specified: Secondary | ICD-10-CM

## 2015-03-19 DIAGNOSIS — C50411 Malignant neoplasm of upper-outer quadrant of right female breast: Secondary | ICD-10-CM | POA: Diagnosis not present

## 2015-03-19 DIAGNOSIS — C50911 Malignant neoplasm of unspecified site of right female breast: Secondary | ICD-10-CM | POA: Diagnosis not present

## 2015-03-19 DIAGNOSIS — C773 Secondary and unspecified malignant neoplasm of axilla and upper limb lymph nodes: Secondary | ICD-10-CM | POA: Diagnosis not present

## 2015-03-19 DIAGNOSIS — D649 Anemia, unspecified: Secondary | ICD-10-CM | POA: Insufficient documentation

## 2015-03-19 DIAGNOSIS — N189 Chronic kidney disease, unspecified: Secondary | ICD-10-CM | POA: Diagnosis not present

## 2015-03-19 DIAGNOSIS — C7951 Secondary malignant neoplasm of bone: Secondary | ICD-10-CM

## 2015-03-19 DIAGNOSIS — C50419 Malignant neoplasm of upper-outer quadrant of unspecified female breast: Secondary | ICD-10-CM | POA: Diagnosis not present

## 2015-03-19 DIAGNOSIS — Z510 Encounter for antineoplastic radiation therapy: Secondary | ICD-10-CM | POA: Insufficient documentation

## 2015-03-19 DIAGNOSIS — E785 Hyperlipidemia, unspecified: Secondary | ICD-10-CM | POA: Diagnosis not present

## 2015-03-19 DIAGNOSIS — Z170 Estrogen receptor positive status [ER+]: Secondary | ICD-10-CM | POA: Diagnosis not present

## 2015-03-19 DIAGNOSIS — I10 Essential (primary) hypertension: Secondary | ICD-10-CM | POA: Insufficient documentation

## 2015-03-19 LAB — CBC WITH DIFFERENTIAL/PLATELET
BASO%: 0 % (ref 0.0–2.0)
Basophils Absolute: 0 10*3/uL (ref 0.0–0.1)
EOS%: 0 % (ref 0.0–7.0)
Eosinophils Absolute: 0 10*3/uL (ref 0.0–0.5)
HCT: 34 % — ABNORMAL LOW (ref 34.8–46.6)
HGB: 11.5 g/dL — ABNORMAL LOW (ref 11.6–15.9)
LYMPH%: 8.2 % — ABNORMAL LOW (ref 14.0–49.7)
MCH: 30 pg (ref 25.1–34.0)
MCHC: 33.8 g/dL (ref 31.5–36.0)
MCV: 88.8 fL (ref 79.5–101.0)
MONO#: 0.2 10*3/uL (ref 0.1–0.9)
MONO%: 5.3 % (ref 0.0–14.0)
NEUT#: 2.9 10*3/uL (ref 1.5–6.5)
NEUT%: 86.5 % — ABNORMAL HIGH (ref 38.4–76.8)
Platelets: 140 10*3/uL — ABNORMAL LOW (ref 145–400)
RBC: 3.83 10*6/uL (ref 3.70–5.45)
RDW: 16.5 % — ABNORMAL HIGH (ref 11.2–14.5)
WBC: 3.4 10*3/uL — ABNORMAL LOW (ref 3.9–10.3)
lymph#: 0.3 10*3/uL — ABNORMAL LOW (ref 0.9–3.3)

## 2015-03-19 LAB — COMPREHENSIVE METABOLIC PANEL (CC13)
ALT: 130 U/L — ABNORMAL HIGH (ref 0–55)
AST: 28 U/L (ref 5–34)
Albumin: 3.2 g/dL — ABNORMAL LOW (ref 3.5–5.0)
Alkaline Phosphatase: 129 U/L (ref 40–150)
Anion Gap: 9 mEq/L (ref 3–11)
BUN: 19.9 mg/dL (ref 7.0–26.0)
CO2: 26 mEq/L (ref 22–29)
Calcium: 8.8 mg/dL (ref 8.4–10.4)
Chloride: 96 mEq/L — ABNORMAL LOW (ref 98–109)
Creatinine: 0.8 mg/dL (ref 0.6–1.1)
EGFR: 79 mL/min/{1.73_m2} — ABNORMAL LOW (ref >90)
Glucose: 208 mg/dl — ABNORMAL HIGH (ref 70–140)
Potassium: 4 mEq/L (ref 3.5–5.1)
Sodium: 131 mEq/L — ABNORMAL LOW (ref 136–145)
Total Bilirubin: 0.54 mg/dL (ref 0.20–1.20)
Total Protein: 5.5 g/dL — ABNORMAL LOW (ref 6.4–8.3)

## 2015-03-19 LAB — URINALYSIS, MICROSCOPIC - CHCC
Bilirubin (Urine): NEGATIVE
Glucose: 1000 mg/dL
Ketones: NEGATIVE mg/dL
Leukocyte Esterase: NEGATIVE
Nitrite: NEGATIVE
Protein: NEGATIVE mg/dL
RBC / HPF: NEGATIVE (ref 0–2)
Specific Gravity, Urine: 1.01 (ref 1.003–1.035)
Urobilinogen, UR: 0.2 mg/dL (ref 0.2–1)
WBC, UA: NEGATIVE (ref 0–2)
pH: 6.5 (ref 4.6–8.0)

## 2015-03-19 LAB — TECHNOLOGIST REVIEW

## 2015-03-19 MED ORDER — LIDOCAINE-PRILOCAINE 2.5-2.5 % EX CREA: TOPICAL_CREAM | CUTANEOUS | Status: DC

## 2015-03-19 MED ORDER — ANASTROZOLE 1 MG PO TABS: 1.0000 mg | ORAL_TABLET | Freq: Every day | ORAL | Status: DC

## 2015-03-19 NOTE — Progress Notes (Signed)
Radiation Oncology         [phone] ________________________________  Initial outpatient Re-Consultation  Name: Heather [name] MRN: [id number]  Date: 03/19/2015  DOB: January 10, 1946  FK:CLEXN,[id number] S, MD  Consuella Lose, MD   REFERRING PHYSICIAN: Consuella Lose, MD  DIAGNOSIS: The encounter diagnosis was Breast cancer metastasized to bone, right ([facility]).    ICD-9-CM ICD-10-CM   1. Breast cancer metastasized to bone, right ([facility]) 174.9 C50.911    198.5 C79.51     HISTORY OF PRESENT ILLNESS::Heather [name] is a 69 y.o. female with a history of breast cancer who developed a solitary spinal metastasis with cord compression.  She underwent decompression by neurosurgery on 02/16/15.     PREVIOUS RADIATION THERAPY: Yes ; Adjuvant radiation therapy 10/25/2012 - 12/11/2012    PAST MEDICAL HISTORY:  has a past medical history of Chronic kidney disease; UTI (urinary tract infection); Right shoulder pain; Right knee pain; Hypertension; Hyperlipidemia; Dizziness; H/O hiatal hernia; GERD (gastroesophageal reflux disease); Hemorrhoids; Urinary frequency; History of UTI; Anemia; Cataract; Anxiety; Insomnia; Complication of anesthesia; Breast cancer ([employer]); Skin cancer; radiation therapy (10/25/12- 12/11/12); H. pylori infection; and Metastasis to spinal column ([place]).    PAST SURGICAL HISTORY: Past Surgical History  Procedure Laterality Date  . Cyst removed from left breast  1970  . Left wrist surgery   2004    with plate  . Colonoscopy    . Esophagogastroduodenoscopy    . Mastectomy, radical    . Mastectomy modified radical  05/10/2012    Procedure: MASTECTOMY MODIFIED RADICAL;  Surgeon: Merrie Roof, MD;  Location: [facility];  Service: General;  Laterality: Right;  RIGHT MODIFIED RADICAL MASTECTOMY  . Portacath placement  05/10/2012    Procedure: INSERTION PORT-A-CATH;  Surgeon: Merrie Roof, MD;  Location: [facility];  Service: General;  Laterality: Left;  . Decompressive lumbar laminectomy level 4  N/A 02/15/2015    Procedure: DECOMPRESSION T5 AND T3-T7 STABALIZATION;  Surgeon: Consuella Lose, MD;  Location: [facility];  Service: Neurosurgery;  Laterality: N/A;    FAMILY HISTORY: family history includes Cancer in her brother; Colon cancer in her brother and maternal grandmother; Diabetes Mellitus II in her father; Leukemia in her mother; Stroke in her father.  SOCIAL HISTORY:  Social History   Social History  . Marital Status: Married    Spouse Name: N/A  . Number of Children: 2  . Years of Education: N/A   Occupational History  . Not on file.   Social History Main Topics  . Smoking status: Never Smoker   . Smokeless tobacco: Never Used  . Alcohol Use: No  . Drug Use: No  . Sexual Activity: Yes    Birth Control/ Protection: Post-menopausal   Other Topics Concern  . Not on file   Social History Narrative    ALLERGIES: Codeine; Keflex; Naprosyn; Oxycodone; and Tussin  MEDICATIONS:  Current Outpatient Prescriptions  Medication Sig Dispense Refill  . anastrozole (ARIMIDEX) 1 MG tablet Take 1 tablet (1 mg total) by mouth daily. 90 tablet 3  . apixaban (ELIQUIS) 2.5 MG TABS tablet Take 1 tablet (2.5 mg total) by mouth 2 (two) times daily. 60 tablet 0  . dexamethasone (DECADRON) 4 MG tablet Take 1 tablet (4 mg total) by mouth 2 (two) times daily. 60 tablet 0  . gabapentin (NEURONTIN) 400 MG capsule Take 1 capsule (400 mg total) by mouth 3 (three) times daily. 90 capsule 1  . letrozole (FEMARA) 2.5 MG tablet     .  lidocaine-prilocaine (EMLA) cream Apply to affected area once 30 g 3  . methocarbamol (ROBAXIN) 500 MG tablet Take 1 tablet (500 mg total) by mouth daily as needed for muscle spasms. 75 tablet 0  . pantoprazole (PROTONIX) 40 MG tablet     . polyethylene glycol (MIRALAX / GLYCOLAX) packet Take 17 g by mouth 2 (two) times daily. 100 each 0  . pravastatin (PRAVACHOL) 10 MG tablet Take 10 mg by mouth daily.     . Probiotic Product (PROBIOTIC DAILY PO) Take 1  capsule by mouth daily.     . ranitidine (ZANTAC) 300 MG tablet Take 300 mg by mouth daily.    Marland Kitchen spironolactone (ALDACTONE) 25 MG tablet Take 0.5 tablets (12.5 mg total) by mouth daily. 45 tablet 12  . sulfamethoxazole-trimethoprim (BACTRIM DS,SEPTRA DS) 800-160 MG tablet     . traMADol (ULTRAM) 50 MG tablet Take 1 tablet (50 mg total) by mouth every 6 (six) hours as needed for moderate pain. 90 tablet 0   No current facility-administered medications for this encounter.   Facility-Administered Medications Ordered in Other Encounters  Medication Dose Route Frequency Provider Last Rate Last Dose  . fludeoxyglucose F - 18 (FDG) injection 6.83 milli Curie  6.83 milli Curie Intravenous Once PRN Tyler Pita, MD   6.83 milli Curie at 03/18/15 [phone]    REVIEW OF SYSTEMS:  A 15 point review of systems is documented in the electronic medical record. This was obtained by the nursing staff. However, I reviewed this with the patient to discuss relevant findings and make appropriate changes.  Original onset was excruciating pain that wrapped around her back, seeing doctors since July to address this. She went into spasms from the pain hurting so bad. When she went to the pain clinic, she was put on muscle relaxers and lost feeling in her waist down. She discontinued the muscle relaxers and began using a walker. At that time, she was diagnosed with MS and they found cancer.    PT comes to home three times per week. OT attempting to schedule visits. Scheduled to follow up with surgeon next Wednesday. Lives one hour away. Patient is in wheelchair. Patient has a history of right sided breast cancer. Takes decadron 4 mg bid. Reports constipation. Reports numbness from the waist down. Reports that she still has feeling in her legs, however she does not have control of her feet.   PHYSICAL EXAM:  vitals were not taken for this visit.  per physical medicine - Constitutional: She appears well-developed and  well-nourished. No distress.  HENT: dentition intact.  Head: Normocephalic and atraumatic.  Mouth/Throat: Oropharynx is clear and moist. No obvious lesions Eyes: Conjunctivae and EOM are normal. Conj WNL Neck: Normal range of motion. Neck supple.  Cardiovascular: Normal rate and regular rhythm.  No murmur heard. Respiratory: Effort normal and breath sounds normal.  GI: Soft. Bowel sounds are normal. She exhibits no distension. There is no tenderness.  Musculoskeletal: She exhibits no tenderness. Trace edema in feet Neurological: She is alert and oriented. Coordination (BLE ataxic) Strength UE grossly 4+/5 with inhibition proximally due to pain. LLE-- hip flexion 3+ to 4-/5, ankle dorsiflexion 4-/5, plantar flexion 4+/5, RLE--- 2/5HF, 2+KE, 3/5 ADF/APF Sensation remains decreased below T5-6, Neuro exam unchanged 11/2 Skin: Skin is warm and dry. No rash noted. No erythema.  Psych: anxiety  KPS = 50  100 - Normal; no complaints; no evidence of disease. 90   - Able to carry on normal activity; minor signs or  symptoms of disease. 80   - Normal activity with effort; some signs or symptoms of disease. 3   - Cares for self; unable to carry on normal activity or to do active work. 60   - Requires occasional assistance, but is able to care for most of his personal needs. 50   - Requires considerable assistance and frequent medical care. 27   - Disabled; requires special care and assistance. 66   - Severely disabled; hospital admission is indicated although death not imminent. 47   - Very sick; hospital admission necessary; active supportive treatment necessary. 10   - Moribund; fatal processes progressing rapidly. 0     - Dead  Karnofsky DA, Abelmann [place], Craver LS and Burchenal JH [phone]) The use of the nitrogen mustards in the palliative treatment of carcinoma: with particular reference to bronchogenic carcinoma Cancer 1 634-56  LABORATORY DATA:  Lab Results  Component Value Date   WBC  3.4* 03/19/2015   HGB 11.5* 03/19/2015   HCT 34.0* 03/19/2015   MCV 88.8 03/19/2015   PLT 140* 03/19/2015   Lab Results  Component Value Date   NA 131* 03/19/2015   K 4.0 03/19/2015   CL 99* 03/09/2015   CO2 26 03/19/2015   Lab Results  Component Value Date   ALT 130* 03/19/2015   AST 28 03/19/2015   ALKPHOS 129 03/19/2015   BILITOT 0.54 03/19/2015     RADIOGRAPHY: Mr Thoracic Spine W Wo Contrast  03/05/2015  CLINICAL DATA:  Weakness in the lower extremities. Functional deficit secondary to T5 cord compression and myelopathy from pathologic compression fracture and metastatic breast cancer. Recent laminectomy. EXAM: MRI THORACIC SPINE WITHOUT AND WITH CONTRAST TECHNIQUE: Multiplanar and multiecho pulse sequences of the thoracic spine, to include the craniocervical junction and cervicothoracic junction, were obtained without and with intravenous contrast. CONTRAST:  110m MULTIHANCE GADOBENATE DIMEGLUMINE 529 MG/ML IV SOLN COMPARISON:  MRI of the thoracic spine 02/14/2015. FINDINGS: Laminectomy is noted at T5. There is posterior spinous stabilization with pedicle screw and rod fixation at T3, T4, and T6 bilaterally. There is residual enhancing tumor posterior to the spinal cord at the T5 level. There some decompression. Cord is still narrowed to 3.5 mm at its minimal diameter. There is extensive tumor extending into the posterior elements bilaterally, right greater than left. No other osseous lesions are present. There is tumor within the foramen on the right at T4-5 and to lesser extent at T5-6. Exaggerated kyphosis is again noted at T5, similar to the prior exam. Slight anterolisthesis of T4 on T6 is noted. The remaining vertebral body heights and alignment maintained. The foramina are patent throughout the remainder of the thoracic spine. IMPRESSION: 1. T5 laminectomy with partial decompression of the spinal canal. 2. There is residual enhancing tissue posterior to the spinal cord which  narrows the cord to 3.5 mm at the level plaque sickle mole compression. 3. Residual enhancing tissue in the right foramen at T4-5 and T5-6 likely representing tumor. 4. Posterior stabilization hardware T3-7. 5. The lower thoracic spine is unremarkable. Electronically Signed   By: CSan MorelleM.D.   On: 03/05/2015 14:10   Nm Pet Image Restag (ps) Skull Base To Thigh  03/18/2015  CLINICAL DATA:  Subsequent treatment strategy for breast cancer with spinal metastases. EXAM: NUCLEAR MEDICINE PET SKULL BASE TO THIGH TECHNIQUE: 6.8 mCi F-18 FDG was injected intravenously. Full-ring PET imaging was performed from the skull base to thigh after the radiotracer. CT data was obtained and used  for attenuation correction and anatomic localization. FASTING BLOOD GLUCOSE:  Value: 163 mg/dl COMPARISON:  MR thoracic spine 03/05/2015, CT abdomen pelvis 02/06/2015. FINDINGS: NECK No hypermetabolic lymph nodes in the neck. CT images show no acute findings. CHEST No hypermetabolic mediastinal, hilar, internal mammary or axillary lymph nodes. Nonspecific mild hypermetabolism associated with the left nipple areolar complex (SUV max 2.5). Mild hypermetabolism at the gastroesophageal junction is seen without a CT correlate, suggesting a physiologic finding. CT images show no pericardial or pleural effusion. Right mastectomy with surgical clips in the right axilla. Mild subpleural radiation fibrosis in the anterior right lung. Linear scarring or atelectasis in the left lower lobe. A few scattered millimetric pulmonary nodules are too small for PET resolution. ABDOMEN/PELVIS No abnormal hypermetabolic activity within the liver, pancreas, adrenal glands, or spleen. No hypermetabolic lymph nodes in the abdomen or pelvis. CT images show the liver, gallbladder, adrenal glands, kidneys, spleen, pancreas, stomach and bowel to be grossly unremarkable. SKELETON No abnormal osseous hypermetabolism. Postoperative changes are seen in the  thoracic spine. IMPRESSION: 1. Residual enhancing soft tissue adjacent to the spinal cord in the thoracic spine is better seen on 03/05/2015. Otherwise, no evidence of metastatic disease. 2. Mild uptake in the left nipple areolar complex is nonspecific. Please correlate clinically. Electronically Signed   By: Lorin Picket M.D.   On: 03/18/2015 12:30      IMPRESSION: The patient has a history of right sided breast cancer with previous radiation. The patient developed an isolated solitary spinal metastasis with cord compression, s/p decompression.  She has some cord function but she is non-ambulatory.  The patient would be a good candidate for adjuvant radiotherapy to preserve remaining cord function, palliate pain and provide disease control in the setting of oligometastatic disease. Tentatively plan for 10-15 treatments.  PLAN: Today, I talked to the patient and family about the findings and work-up thus far.  We discussed the natural history of thoracic spine metastasis and general treatment, highlighting the role of radiotherapy in the management.  We discussed the available radiation techniques, and focused on the details of logistics and delivery.  We reviewed the anticipated acute and late sequelae associated with radiation in this setting.  The patient was encouraged to ask questions that I answered to the best of my ability.  I filled out a patient counseling form during our discussion including treatment diagrams.  We retained a copy for our records.  The patient would like to proceed with radiation and has been scheduled for CT simulation tomorrow, 11/11, at 3 pm.  I spent 60 minutes minutes face to face with the patient and more than 50% of that time was spent in counseling and/or coordination of care.   ------------------------------------------------  Sheral Apley. [name], M.D.   This document serves as a record of services personally performed by Tyler Pita, MD. It was created on his  behalf by Arlyce Harman, a trained medical scribe. The creation of this record is based on the scribe's personal observations and the provider's statements to them. This document has been checked and approved by the attending provider.

## 2015-03-19 NOTE — Assessment & Plan Note (Signed)
Right breast invasive ductal carcinoma ER positive PR negative HER-2 positive Ki-67 44% multifocal disease 3/7 lymph nodes positive T2 N1 M0 stage IIB status post adjuvant chemotherapy with TCH followed by Herceptin maintenance, adjuvant radiation therapy, currently on letrozole since 02/07/2013. MRI 02/14/2015: T5 large destructive lesion with pathologic compression fracture and extensive epidural tumor, involvement of T4, excision metastatic carcinoma ER 60%, PR 0%, HER-2 positive ratio 2.71 average copy #7.45 PET CT 03/18/2015:Residual enhancing soft tissue adjacent to the spinal cord. No evidence of metastatic disease. Nonspecific uptake the left nipple.  Recommendation: 1. Radiation therapy to the spine 2. Change antiestrogen therapy to anastrozole 3. Add Herceptin every 3 week 4. Obtain echocardiograms every 3 months  Return to clinic after radiation to start Herceptin

## 2015-03-19 NOTE — Progress Notes (Signed)
Patient Care Team: [name], MD as PCP - General (Family Medicine)  DIAGNOSIS: Primary cancer of upper outer quadrant of right female breast [facility])   Staging form: Breast, AJCC 7th Edition     Clinical: Stage IIB (T2, N1, cM0) - Unsigned       Staging comments: Staged at breast conference 11.6.13      Pathologic: No stage assigned - Unsigned   SUMMARY OF ONCOLOGIC HISTORY:   Primary cancer of upper outer quadrant of right female breast ([place])   03/08/2012 Initial Diagnosis Right breast invasive ductal carcinoma ER positive PR negative HER-2 positive Ki-67 44%; another breast mass biopsied in the anterior part of the breast which was also positive for malignancy that was HER-2 negative   05/10/2012 Surgery Right mastectomy and axillary lymph node dissection: Multifocal disease 5 cm, 1.7 cm, 1.6 cm, ER positive PR negative HER-2 positive Ki-67 44%, 3/7 lymph nodes positive   06/14/2012 - 06/12/2013 Chemotherapy Adjuvant chemotherapy with [place] 6 followed by Herceptin maintenance   10/25/2012 - 12/11/2012 Radiation Therapy Adjuvant radiation therapy   02/07/2013 -  Anti-estrogen oral therapy Letrozole 2.5 mg daily   02/14/2015 Imaging MRI spine: Large destructive T5 lesion with severe pathologic compression fracture and extensive epidural tumor, severe spinal stenosis with moderate cord compression, tumor involvement of T4   03/18/2015 PET scan Residual enhancing soft tissue adjacent to the spinal cord. No evidence of metastatic disease. Nonspecific uptake the left nipple    CHIEF COMPLIANT: Follow-up to discuss a PET CT scan  INTERVAL HISTORY: Heather [name] is a 69 year old with above-mentioned history of right breast cancer with adjuvant chemotherapy radiation and was on letrozole who came in with a T4 compression fracture and tumor invasion. She had resection but in spite of that she has bilateral lower extremity paralysis. She had a recent PET CT scan and is here accompanied by her family to  discuss the reports. She is working with physical therapy to get stronger. Sensation below the waist level is very poor.  REVIEW OF SYSTEMS:   Constitutional: Denies fevers, chills or abnormal weight loss Eyes: Denies blurriness of vision Ears, nose, mouth, throat, and face: Denies mucositis or sore throat Respiratory: Denies cough, dyspnea or wheezes Cardiovascular: Denies palpitation, chest discomfort or lower extremity swelling Gastrointestinal:  Denies nausea, heartburn or change in bowel habits Skin: Denies abnormal skin rashes Lymphatics: Denies new lymphadenopathy or easy bruising Neurological: Lower extremity paralysis, numbness below the waist Behavioral/Psych: Mood is stable, no new changes  All other systems were reviewed with the patient and are negative.  I have reviewed the past medical history, past surgical history, social history and family history with the patient and they are unchanged from previous note.  ALLERGIES:  is allergic to codeine; keflex; naprosyn; oxycodone; and tussin.  MEDICATIONS:  Current Outpatient Prescriptions  Medication Sig Dispense Refill  . amoxicillin (AMOXIL) 500 MG capsule     . apixaban (ELIQUIS) 2.5 MG TABS tablet Take 1 tablet (2.5 mg total) by mouth 2 (two) times daily. 60 tablet 0  . clarithromycin (BIAXIN) 500 MG tablet     . dexamethasone (DECADRON) 4 MG tablet Take 1 tablet (4 mg total) by mouth 2 (two) times daily. 60 tablet 0  . fluconazole (DIFLUCAN) 150 MG tablet     . gabapentin (NEURONTIN) 400 MG capsule Take 1 capsule (400 mg total) by mouth 3 (three) times daily. 90 capsule 1  . letrozole (FEMARA) 2.5 MG tablet Take 1 tablet (2.5 mg total)  by mouth daily. 90 tablet 3  . lidocaine (LIDODERM) 5 % Apply to thoracic area at 7 am and remove at 7 pm daily. 60 patch 0  . methocarbamol (ROBAXIN) 500 MG tablet Take 1 tablet (500 mg total) by mouth daily as needed for muscle spasms. 75 tablet 0  . nystatin (MYCOSTATIN) 100000 UNIT/ML  suspension     . pantoprazole (PROTONIX) 40 MG tablet     . polyethylene glycol (MIRALAX / GLYCOLAX) packet Take 17 g by mouth 2 (two) times daily. 100 each 0  . pravastatin (PRAVACHOL) 10 MG tablet Take 10 mg by mouth daily.     . Probiotic Product (PROBIOTIC DAILY PO) Take 1 capsule by mouth daily.     . ranitidine (ZANTAC) 300 MG tablet Take 300 mg by mouth daily.    Marland Kitchen spironolactone (ALDACTONE) 25 MG tablet Take 0.5 tablets (12.5 mg total) by mouth daily. 45 tablet 12  . sucralfate (CARAFATE) 1 G tablet     . sulfamethoxazole-trimethoprim (BACTRIM DS,SEPTRA DS) 800-160 MG tablet Take 1 tablet by mouth 2 (two) times daily. 10 tablet 0  . traMADol (ULTRAM) 50 MG tablet Take 1 tablet (50 mg total) by mouth every 6 (six) hours as needed for moderate pain. 90 tablet 0   No current facility-administered medications for this visit.   Facility-Administered Medications Ordered in Other Visits  Medication Dose Route Frequency Provider Last Rate Last Dose  . fludeoxyglucose F - 18 (FDG) injection 6.83 milli Curie  6.83 milli Curie Intravenous Once PRN Tyler Pita, MD   6.83 milli Curie at 03/18/15 [phone]    PHYSICAL EXAMINATION: ECOG PERFORMANCE STATUS: 3 - Symptomatic, >50% confined to bed  Filed Vitals:   03/19/15 1543  BP: 135/74  Pulse: 83  Temp: 98.1 F (36.7 C)  Resp: 18   Filed Weights   03/19/15 1543  Weight: 131 lb (59.421 kg)    GENERAL:alert, no distress and comfortable SKIN: skin color, texture, turgor are normal, no rashes or significant lesions EYES: normal, Conjunctiva are pink and non-injected, sclera clear OROPHARYNX:no exudate, no erythema and lips, buccal mucosa, and tongue normal  NECK: supple, thyroid normal size, non-tender, without nodularity LYMPH:  no palpable lymphadenopathy in the cervical, axillary or inguinal LUNGS: clear to auscultation and percussion with normal breathing effort HEART: regular rate & rhythm and no murmurs and no lower extremity  edema ABDOMEN:abdomen soft, non-tender and normal bowel sounds Musculoskeletal:no cyanosis of digits and no clubbing  NEURO: alert & oriented x 3 with fluent speech, numbness below her waist, lower extremity paralysis  LABORATORY DATA:  I have reviewed the data as listed   Chemistry      Component Value Date/Time   NA 131* 03/19/2015 1518   NA 136 03/09/2015 0435   K 4.0 03/19/2015 1518   K 4.2 03/09/2015 0435   CL 99* 03/09/2015 0435   CL 101 10/25/2012 0946   CO2 26 03/19/2015 1518   CO2 29 03/09/2015 0435   BUN 19.9 03/19/2015 1518   BUN 24* 03/09/2015 0435   CREATININE 0.8 03/19/2015 1518   CREATININE 0.68 03/09/2015 0435      Component Value Date/Time   CALCIUM 8.8 03/19/2015 1518   CALCIUM 9.0 03/09/2015 0435   ALKPHOS 129 03/19/2015 1518   ALKPHOS 62 02/19/2015 0549   AST 28 03/19/2015 1518   AST 44* 02/19/2015 0549   ALT 130* 03/19/2015 1518   ALT 58* 02/19/2015 0549   BILITOT 0.54 03/19/2015 1518   BILITOT 0.6 02/19/2015  9357       Lab Results  Component Value Date   WBC 3.4* 03/19/2015   HGB 11.5* 03/19/2015   HCT 34.0* 03/19/2015   MCV 88.8 03/19/2015   PLT 140* 03/19/2015   NEUTROABS 2.9 03/19/2015     RADIOGRAPHIC STUDIES: I have personally reviewed the radiology reports and agreed with their findings. Nm Pet Image Restag (ps) Skull Base To Thigh  03/18/2015  CLINICAL DATA:  Subsequent treatment strategy for breast cancer with spinal metastases. EXAM: NUCLEAR MEDICINE PET SKULL BASE TO THIGH TECHNIQUE: 6.8 mCi F-18 FDG was injected intravenously. Full-ring PET imaging was performed from the skull base to thigh after the radiotracer. CT data was obtained and used for attenuation correction and anatomic localization. FASTING BLOOD GLUCOSE:  Value: 163 mg/dl COMPARISON:  MR thoracic spine 03/05/2015, CT abdomen pelvis 02/06/2015. FINDINGS: NECK No hypermetabolic lymph nodes in the neck. CT images show no acute findings. CHEST No hypermetabolic  mediastinal, hilar, internal mammary or axillary lymph nodes. Nonspecific mild hypermetabolism associated with the left nipple areolar complex (SUV max 2.5). Mild hypermetabolism at the gastroesophageal junction is seen without a CT correlate, suggesting a physiologic finding. CT images show no pericardial or pleural effusion. Right mastectomy with surgical clips in the right axilla. Mild subpleural radiation fibrosis in the anterior right lung. Linear scarring or atelectasis in the left lower lobe. A few scattered millimetric pulmonary nodules are too small for PET resolution. ABDOMEN/PELVIS No abnormal hypermetabolic activity within the liver, pancreas, adrenal glands, or spleen. No hypermetabolic lymph nodes in the abdomen or pelvis. CT images show the liver, gallbladder, adrenal glands, kidneys, spleen, pancreas, stomach and bowel to be grossly unremarkable. SKELETON No abnormal osseous hypermetabolism. Postoperative changes are seen in the thoracic spine. IMPRESSION: 1. Residual enhancing soft tissue adjacent to the spinal cord in the thoracic spine is better seen on 03/05/2015. Otherwise, no evidence of metastatic disease. 2. Mild uptake in the left nipple areolar complex is nonspecific. Please correlate clinically. Electronically Signed   By: Lorin Picket M.D.   On: 03/18/2015 12:30     ASSESSMENT & PLAN:  Primary cancer of upper outer quadrant of right female breast ([place]) Right breast invasive ductal carcinoma ER positive PR negative HER-2 positive Ki-67 44% multifocal disease 3/7 lymph nodes positive T2 N1 M0 stage IIB status post adjuvant chemotherapy with TCH followed by Herceptin maintenance, adjuvant radiation therapy, currently on letrozole since 02/07/2013. MRI 02/14/2015: T5 large destructive lesion with pathologic compression fracture and extensive epidural tumor, involvement of T4, excision metastatic carcinoma ER 60%, PR 0%, HER-2 positive ratio 2.71 average copy #7.45 PET CT  03/18/2015:Residual enhancing soft tissue adjacent to the spinal cord. No evidence of metastatic disease. Nonspecific uptake the left nipple.  Recommendation: 1. Radiation therapy to the spine 2. Change antiestrogen therapy to anastrozole 3. Add Herceptin every 3 week 4. Obtain echocardiograms every 3 months  We discussed other treatment options including Perjeta, Kadcyla etc but because there was no evidence of distant metastatic disease beyond the thoracic spine, we elected to treat her with Herceptin and anastrozole alone. Goal of treatment: Palliation and prolongation of life. I encouraged her to continue to work with physical therapy and get stronger. Once she gets stronger we may be able to discontinue her anticoagulation.  I will request Dr. Marlou Starks for port We will order echocardiogram for baseline.  Return to clinic after radiation to start Herceptin once the port is been placed. Anastrozole will be started after radiation is complete. I instructed  the patient to stop letrozole.  No orders of the defined types were placed in this encounter.   The patient has a good understanding of the overall plan. she agrees with it. she will call with any problems that may develop before the next visit here.   Rulon Eisenmenger, MD 03/19/2015

## 2015-03-19 NOTE — Progress Notes (Signed)
See progress note under physician encounter. 

## 2015-03-20 ENCOUNTER — Ambulatory Visit
Admission: RE | Admit: 2015-03-20 | Discharge: 2015-03-20 | Disposition: A | Discharge status: Home / Self Care | Payer: Medicare Other | Source: Ambulatory Visit | Attending: Radiation Oncology | Admitting: Radiation Oncology

## 2015-03-20 ENCOUNTER — Telehealth: Payer: Self-pay | Admitting: Hematology and Oncology

## 2015-03-20 DIAGNOSIS — M8458XD Pathological fracture in neoplastic disease, other specified site, subsequent encounter for fracture with routine healing: Secondary | ICD-10-CM | POA: Diagnosis not present

## 2015-03-20 DIAGNOSIS — Z510 Encounter for antineoplastic radiation therapy: Secondary | ICD-10-CM | POA: Diagnosis not present

## 2015-03-20 DIAGNOSIS — I10 Essential (primary) hypertension: Secondary | ICD-10-CM | POA: Diagnosis not present

## 2015-03-20 DIAGNOSIS — C7951 Secondary malignant neoplasm of bone: Principal | ICD-10-CM

## 2015-03-20 DIAGNOSIS — G8222 Paraplegia, incomplete: Secondary | ICD-10-CM | POA: Diagnosis not present

## 2015-03-20 DIAGNOSIS — C50911 Malignant neoplasm of unspecified site of right female breast: Secondary | ICD-10-CM | POA: Diagnosis not present

## 2015-03-20 DIAGNOSIS — E785 Hyperlipidemia, unspecified: Secondary | ICD-10-CM | POA: Diagnosis not present

## 2015-03-20 DIAGNOSIS — I129 Hypertensive chronic kidney disease with stage 1 through stage 4 chronic kidney disease, or unspecified chronic kidney disease: Secondary | ICD-10-CM | POA: Diagnosis not present

## 2015-03-20 DIAGNOSIS — N390 Urinary tract infection, site not specified: Secondary | ICD-10-CM | POA: Diagnosis not present

## 2015-03-20 DIAGNOSIS — N189 Chronic kidney disease, unspecified: Secondary | ICD-10-CM | POA: Diagnosis not present

## 2015-03-20 DIAGNOSIS — C50919 Malignant neoplasm of unspecified site of unspecified female breast: Secondary | ICD-10-CM

## 2015-03-20 DIAGNOSIS — C50411 Malignant neoplasm of upper-outer quadrant of right female breast: Secondary | ICD-10-CM | POA: Diagnosis not present

## 2015-03-20 NOTE — Progress Notes (Signed)
See progress note under physician encounter. 

## 2015-03-20 NOTE — Progress Notes (Signed)
  Radiation Oncology         [phone] ________________________________  Name: Heather [name]  MRN: [id number]  Date: 03/20/2015  DOB: 18-Apr-1946  SIMULATION AND TREATMENT PLANNING NOTE    ICD-9-CM ICD-10-CM   1. Breast cancer metastasized to bone, unspecified laterality (HCC) 174.9 C50.919    198.5 C79.51     DIAGNOSIS: 69 yo woman with an isolated solitary spine metastasis from metastatic breast cancer  NARRATIVE:  The patient was brought to the [facility].  Identity was confirmed.  All relevant records and images related to the planned course of therapy were reviewed.  The patient freely provided informed written consent to proceed with treatment after reviewing the details related to the planned course of therapy. The consent form was witnessed and verified by the simulation staff.  Then, the patient was set-up in a stable reproducible  supine position for radiation therapy.  CT images were obtained.  Surface markings were placed.  The CT images were loaded into the planning software.  Then the target and avoidance structures were contoured.  Treatment planning then occurred.  The radiation prescription was entered and confirmed.  Then, I designed and supervised the construction of a total of 1 medically necessary complex treatment devices; a body fix pillow.  I have requested : Intensity Modulated Radiotherapy (IMRT) is medically necessary for this case for the following reason:  Tumor wraps circumferentially around the spinal cord and this represents a solitary oligometastasis warranting definitive doses.  I have ordered:Nutrition Consult  PLAN:  The patient will receive 30 Gy in 10 fractions followed by boost.  ________________________________  Sheral Apley. [name], M.D.  This document serves as a record of services personally performed by Tyler Pita, MD. It was created on his behalf by Darcus Austin, a trained medical scribe. The creation of this record is based on  the scribe's personal observations and the provider's statements to them. This document has been checked and approved by the attending provider.

## 2015-03-20 NOTE — Progress Notes (Signed)
Miscellaneous notes. Scheduled to follow up with surgeon on Wednesday, November 16 at 1430. Prefers afternoon appointments. Lives 45 minutes away. PT comes to her home three times per week. Waiting to schedule OTC until RT is arranged. Taking Mira lax to manage constipation. Recovering from UTI. Denies dysuria or hematuria. Reports numbness from the waist down. Reports constant circumferential pain with intermittent sharp stabbing pain. Unable to ambulate. Radiated by Dr. Valere Dross for breast ca in 2014.

## 2015-03-20 NOTE — Telephone Encounter (Signed)
Called and left a message with new appointments

## 2015-03-21 LAB — URINE CULTURE

## 2015-03-22 ENCOUNTER — Encounter: Payer: Self-pay | Admitting: Radiation Therapy

## 2015-03-23 ENCOUNTER — Telehealth: Payer: Self-pay | Admitting: Physical Medicine & Rehabilitation

## 2015-03-23 ENCOUNTER — Other Ambulatory Visit: Payer: Self-pay

## 2015-03-23 DIAGNOSIS — M8458XD Pathological fracture in neoplastic disease, other specified site, subsequent encounter for fracture with routine healing: Secondary | ICD-10-CM | POA: Diagnosis not present

## 2015-03-23 DIAGNOSIS — C50911 Malignant neoplasm of unspecified site of right female breast: Secondary | ICD-10-CM

## 2015-03-23 DIAGNOSIS — N390 Urinary tract infection, site not specified: Secondary | ICD-10-CM | POA: Diagnosis not present

## 2015-03-23 DIAGNOSIS — C7951 Secondary malignant neoplasm of bone: Principal | ICD-10-CM

## 2015-03-23 DIAGNOSIS — G8222 Paraplegia, incomplete: Secondary | ICD-10-CM | POA: Diagnosis not present

## 2015-03-23 DIAGNOSIS — C50411 Malignant neoplasm of upper-outer quadrant of right female breast: Secondary | ICD-10-CM | POA: Diagnosis not present

## 2015-03-23 DIAGNOSIS — I129 Hypertensive chronic kidney disease with stage 1 through stage 4 chronic kidney disease, or unspecified chronic kidney disease: Secondary | ICD-10-CM | POA: Diagnosis not present

## 2015-03-23 MED ORDER — LIDOCAINE-PRILOCAINE 2.5-2.5 % EX CREA: TOPICAL_CREAM | CUTANEOUS | Status: DC

## 2015-03-23 NOTE — Telephone Encounter (Signed)
Heather [name] OT with [facility] would like to get a a verbal order for 1w2.  Please call her at [phone].

## 2015-03-23 NOTE — Telephone Encounter (Signed)
Given verbal order for Occupational Therapist.

## 2015-03-24 DIAGNOSIS — N390 Urinary tract infection, site not specified: Secondary | ICD-10-CM | POA: Diagnosis not present

## 2015-03-24 DIAGNOSIS — G8222 Paraplegia, incomplete: Secondary | ICD-10-CM | POA: Diagnosis not present

## 2015-03-24 DIAGNOSIS — I129 Hypertensive chronic kidney disease with stage 1 through stage 4 chronic kidney disease, or unspecified chronic kidney disease: Secondary | ICD-10-CM | POA: Diagnosis not present

## 2015-03-24 DIAGNOSIS — C50411 Malignant neoplasm of upper-outer quadrant of right female breast: Secondary | ICD-10-CM | POA: Diagnosis not present

## 2015-03-24 DIAGNOSIS — C7951 Secondary malignant neoplasm of bone: Secondary | ICD-10-CM | POA: Diagnosis not present

## 2015-03-24 DIAGNOSIS — M8458XD Pathological fracture in neoplastic disease, other specified site, subsequent encounter for fracture with routine healing: Secondary | ICD-10-CM | POA: Diagnosis not present

## 2015-03-24 LAB — GLUCOSE, CAPILLARY: Glucose-Capillary: 163 mg/dL — ABNORMAL HIGH (ref 65–99)

## 2015-03-25 ENCOUNTER — Other Ambulatory Visit: Payer: Self-pay | Admitting: General Surgery

## 2015-03-25 DIAGNOSIS — C50411 Malignant neoplasm of upper-outer quadrant of right female breast: Secondary | ICD-10-CM | POA: Diagnosis not present

## 2015-03-25 DIAGNOSIS — M8458XD Pathological fracture in neoplastic disease, other specified site, subsequent encounter for fracture with routine healing: Secondary | ICD-10-CM | POA: Diagnosis not present

## 2015-03-25 DIAGNOSIS — G8222 Paraplegia, incomplete: Secondary | ICD-10-CM | POA: Diagnosis not present

## 2015-03-25 DIAGNOSIS — C7951 Secondary malignant neoplasm of bone: Secondary | ICD-10-CM | POA: Diagnosis not present

## 2015-03-25 DIAGNOSIS — N390 Urinary tract infection, site not specified: Secondary | ICD-10-CM | POA: Diagnosis not present

## 2015-03-25 DIAGNOSIS — I129 Hypertensive chronic kidney disease with stage 1 through stage 4 chronic kidney disease, or unspecified chronic kidney disease: Secondary | ICD-10-CM | POA: Diagnosis not present

## 2015-03-26 ENCOUNTER — Other Ambulatory Visit: Payer: Self-pay | Admitting: Hematology and Oncology

## 2015-03-26 ENCOUNTER — Ambulatory Visit: Payer: Medicare Other | Admitting: Radiation Oncology

## 2015-03-26 ENCOUNTER — Encounter (HOSPITAL_COMMUNITY): Payer: Self-pay | Admitting: *Deleted

## 2015-03-26 ENCOUNTER — Other Ambulatory Visit: Payer: Self-pay | Admitting: Neurosurgery

## 2015-03-26 ENCOUNTER — Ambulatory Visit
Admission: RE | Admit: 2015-03-26 | Discharge: 2015-03-26 | Disposition: A | Discharge status: Home / Self Care | Payer: Medicare Other | Source: Ambulatory Visit | Attending: Radiation Oncology | Admitting: Radiation Oncology

## 2015-03-26 DIAGNOSIS — C50411 Malignant neoplasm of upper-outer quadrant of right female breast: Secondary | ICD-10-CM | POA: Diagnosis not present

## 2015-03-26 DIAGNOSIS — I129 Hypertensive chronic kidney disease with stage 1 through stage 4 chronic kidney disease, or unspecified chronic kidney disease: Secondary | ICD-10-CM | POA: Diagnosis not present

## 2015-03-26 DIAGNOSIS — N390 Urinary tract infection, site not specified: Secondary | ICD-10-CM | POA: Diagnosis not present

## 2015-03-26 DIAGNOSIS — C7951 Secondary malignant neoplasm of bone: Secondary | ICD-10-CM | POA: Diagnosis not present

## 2015-03-26 DIAGNOSIS — M8458XD Pathological fracture in neoplastic disease, other specified site, subsequent encounter for fracture with routine healing: Secondary | ICD-10-CM | POA: Diagnosis not present

## 2015-03-26 DIAGNOSIS — G8222 Paraplegia, incomplete: Secondary | ICD-10-CM | POA: Diagnosis not present

## 2015-03-26 MED ORDER — VANCOMYCIN HCL IN DEXTROSE 1-5 GM/200ML-% IV SOLN
1000.0000 mg | INTRAVENOUS | Status: AC
  Administered 2015-03-27: 1000 mg via INTRAVENOUS
  Filled 2015-03-26: qty 200

## 2015-03-26 MED ORDER — CHLORHEXIDINE GLUCONATE 4 % EX LIQD: 1.0000 "application " | Freq: Once | CUTANEOUS | Status: DC

## 2015-03-26 NOTE — Progress Notes (Signed)
Anesthesia Chart Review: SAME DAY WORK-UP (late add on).  Patient is a 69 year old female scheduled for T4-6 laminectomy for tumor tomorrow by Dr. Kathyrn Sheriff. Case is currently posted for 5 PM. (Per Manuela Schwartz at Dr. Cleotilde Neer office, patient to be NPO after MN, arrive at 2 PM. Patient said she became dehydrated after long NPO status with previous surgery, so wanting to start IVF close to her arrival time.)  History includes never smoker, recurrent UTI, HTN, HLD, hiatal hernia, GERD, anemia, anxiety, breast cancer, skin cancer, "sensitive to meds" (anesthesia). She was diagnosed with right breast cancer in 02/2012 s/p modified radical mastectomy 05/10/12 s/p chemoradiation. Presented 02/2015 with LE weakness and urinary retention and found to have T5 compression and diagnosed with spinal metastasis s/p decompression T5 and T3-7 02/15/15 and is undergoing IMRT treatments with RAD-ONC and started on anastrozole with plans to restart Herceptin after radiation.  She is currently scheduled for insertion of a Port-a-cath by Dr. Marlou Starks on 04/09/15 (his orders are already in [place]).   PCP is Dr. [name]. HEM-ONC [name]. RAD-ONC Dr. [name]. She had been followed in [facility] thru 12/2013 (since Herceptin therapy was completed in 06/2013; now due to restart following radiation therapy and will be getting another echo in 3 months).  Med list is not yet updated. Currently listed as anastrozole, Eliquis, Decadron, Neurontin, Femara, Robaxin, Protonix, Pravachol, Probiotic, Zantac, Aldactone, Bactrim DS, tramadol. Manuela Schwartz spoke with patient who did confirm that she has been off Eliquis and had been discussed with Dr. Kathyrn Sheriff.)  02/14/15 EKG: SR, probable LAE, borderline T wave abnormalities, anterior leads.  12/16/13 Echo: Impressions: - Normal biventricular size and systolic function. Impaired relaxation with normal filling pressures. Mild tricuspid regurgitation. Strain parameters are  normal and unchanged from the prior study on 05/27/2013. Global longitudinal strain: -25% Lateral S prime: 10 cm/sec.  01/11/15 CXR: IMPRESSION: No active cardiopulmonary disease.  Labs from 03/19/15 noted. NA 131, K 4.0, glucose 208, AST 28, ALT 130 (up from 58 on 02/19/15), Cr 0.8. WBC 3.4, H/H 11.5/34.0, PLT 140. She will need a new CMET to re-evaluate glucose and LFTs.   PAT RN to call patient today to complete her phone interview.  [facility] Short Stay [facility]/Anesthesiology Phone [phone] 03/26/2015 11:04 AM

## 2015-03-26 NOTE — Progress Notes (Signed)
Pt denies SOB, chest pain, and being under the care of a cardiologist. Pt denies having a stress test and  Cardiac cath. Pt made aware to stop, otc vitamins and herbal medications. Do not take any NSAIDs ie: Ibuprofen, Advil, Naproxen or any medication containing Aspirin. Pt stated that she D/C Eliquis. Pt verbalized understanding of all pre-op instructions. Ebony Hail, [place], anesthesia, reviewed pt chart; see note.

## 2015-03-27 ENCOUNTER — Ambulatory Visit (HOSPITAL_COMMUNITY): Payer: Medicare Other

## 2015-03-27 ENCOUNTER — Inpatient Hospital Stay (HOSPITAL_COMMUNITY)
Admission: RE | Admit: 2015-03-27 | Discharge: 2015-03-30 | DRG: 520 | Disposition: A | Discharge status: Home Health | Payer: Medicare Other | Source: Ambulatory Visit | Attending: Neurosurgery | Admitting: Neurosurgery

## 2015-03-27 ENCOUNTER — Encounter (HOSPITAL_COMMUNITY): Payer: Self-pay | Admitting: *Deleted

## 2015-03-27 ENCOUNTER — Ambulatory Visit (HOSPITAL_COMMUNITY): Payer: Medicare Other | Admitting: Vascular Surgery

## 2015-03-27 ENCOUNTER — Ambulatory Visit: Payer: Medicare Other

## 2015-03-27 ENCOUNTER — Encounter (HOSPITAL_COMMUNITY)
Admission: RE | Disposition: A | Discharge status: Home Health | Payer: Self-pay | Source: Ambulatory Visit | Attending: Neurosurgery

## 2015-03-27 DIAGNOSIS — Z881 Allergy status to other antibiotic agents status: Secondary | ICD-10-CM | POA: Diagnosis not present

## 2015-03-27 DIAGNOSIS — G4700 Insomnia, unspecified: Secondary | ICD-10-CM | POA: Diagnosis present

## 2015-03-27 DIAGNOSIS — K219 Gastro-esophageal reflux disease without esophagitis: Secondary | ICD-10-CM | POA: Diagnosis present

## 2015-03-27 DIAGNOSIS — C7949 Secondary malignant neoplasm of other parts of nervous system: Secondary | ICD-10-CM | POA: Diagnosis not present

## 2015-03-27 DIAGNOSIS — Z885 Allergy status to narcotic agent status: Secondary | ICD-10-CM | POA: Diagnosis not present

## 2015-03-27 DIAGNOSIS — Z886 Allergy status to analgesic agent status: Secondary | ICD-10-CM | POA: Diagnosis not present

## 2015-03-27 DIAGNOSIS — G9529 Other cord compression: Secondary | ICD-10-CM | POA: Diagnosis not present

## 2015-03-27 DIAGNOSIS — F419 Anxiety disorder, unspecified: Secondary | ICD-10-CM | POA: Diagnosis present

## 2015-03-27 DIAGNOSIS — C7951 Secondary malignant neoplasm of bone: Principal | ICD-10-CM | POA: Diagnosis present

## 2015-03-27 DIAGNOSIS — Z419 Encounter for procedure for purposes other than remedying health state, unspecified: Secondary | ICD-10-CM

## 2015-03-27 DIAGNOSIS — Z888 Allergy status to other drugs, medicaments and biological substances status: Secondary | ICD-10-CM | POA: Diagnosis not present

## 2015-03-27 DIAGNOSIS — Z79899 Other long term (current) drug therapy: Secondary | ICD-10-CM

## 2015-03-27 DIAGNOSIS — S22050A Wedge compression fracture of T5-T6 vertebra, initial encounter for closed fracture: Secondary | ICD-10-CM | POA: Diagnosis not present

## 2015-03-27 DIAGNOSIS — IMO0001 Reserved for inherently not codable concepts without codable children: Secondary | ICD-10-CM

## 2015-03-27 DIAGNOSIS — IMO0002 Reserved for concepts with insufficient information to code with codable children: Secondary | ICD-10-CM

## 2015-03-27 DIAGNOSIS — Z923 Personal history of irradiation: Secondary | ICD-10-CM

## 2015-03-27 DIAGNOSIS — Z853 Personal history of malignant neoplasm of breast: Secondary | ICD-10-CM | POA: Diagnosis not present

## 2015-03-27 DIAGNOSIS — E785 Hyperlipidemia, unspecified: Secondary | ICD-10-CM | POA: Diagnosis not present

## 2015-03-27 DIAGNOSIS — C50919 Malignant neoplasm of unspecified site of unspecified female breast: Secondary | ICD-10-CM | POA: Diagnosis not present

## 2015-03-27 DIAGNOSIS — Z9011 Acquired absence of right breast and nipple: Secondary | ICD-10-CM

## 2015-03-27 DIAGNOSIS — I10 Essential (primary) hypertension: Secondary | ICD-10-CM

## 2015-03-27 HISTORY — DX: Other specified postprocedural states: Z98.890

## 2015-03-27 HISTORY — PX: LAMINECTOMY: SHX219

## 2015-03-27 HISTORY — DX: Other specified postprocedural states: R11.2

## 2015-03-27 LAB — COMPREHENSIVE METABOLIC PANEL
ALT: 136 U/L — ABNORMAL HIGH (ref 14–54)
AST: 42 U/L — ABNORMAL HIGH (ref 15–41)
Albumin: 3.4 g/dL — ABNORMAL LOW (ref 3.5–5.0)
Alkaline Phosphatase: 124 U/L (ref 38–126)
Anion gap: 7 (ref 5–15)
BUN: 25 mg/dL — ABNORMAL HIGH (ref 6–20)
CO2: 31 mmol/L (ref 22–32)
Calcium: 9.4 mg/dL (ref 8.9–10.3)
Chloride: 97 mmol/L — ABNORMAL LOW (ref 101–111)
Creatinine, Ser: 0.79 mg/dL (ref 0.44–1.00)
GFR calc Af Amer: >60 mL/min (ref >60)
GFR calc non Af Amer: >60 mL/min (ref >60)
Glucose, Bld: 112 mg/dL — ABNORMAL HIGH (ref 65–99)
Potassium: 4.2 mmol/L (ref 3.5–5.1)
Sodium: 135 mmol/L (ref 135–145)
Total Bilirubin: 0.9 mg/dL (ref 0.3–1.2)
Total Protein: 6 g/dL — ABNORMAL LOW (ref 6.5–8.1)

## 2015-03-27 LAB — TYPE AND SCREEN
ABO/RH(D): B POS
Antibody Screen: NEGATIVE

## 2015-03-27 LAB — PROTIME-INR
INR: 0.98 (ref 0.00–1.49)
Prothrombin Time: 13.2 seconds (ref 11.6–15.2)

## 2015-03-27 LAB — SURGICAL PCR SCREEN
MRSA, PCR: NEGATIVE
Staphylococcus aureus: NEGATIVE

## 2015-03-27 LAB — ABO/RH: ABO/RH(D): B POS

## 2015-03-27 LAB — APTT: aPTT: 21 seconds — ABNORMAL LOW (ref 24–37)

## 2015-03-27 SURGERY — THORACIC LAMINECTOMY FOR TUMOR
Anesthesia: General | Site: Spine Thoracic

## 2015-03-27 MED ORDER — ACETAMINOPHEN 325 MG PO TABS: 650.0000 mg | ORAL_TABLET | ORAL | Status: DC | PRN

## 2015-03-27 MED ORDER — LIDOCAINE HCL (CARDIAC) 20 MG/ML IV SOLN
INTRAVENOUS | Status: AC
  Filled 2015-03-27: qty 5

## 2015-03-27 MED ORDER — LIDOCAINE HCL (CARDIAC) 20 MG/ML IV SOLN
INTRAVENOUS | Status: DC | PRN
  Administered 2015-03-27: 50 mg via INTRAVENOUS

## 2015-03-27 MED ORDER — LACTATED RINGERS IV SOLN
Freq: Once | INTRAVENOUS | Status: AC
  Administered 2015-03-27: 15:00:00 via INTRAVENOUS

## 2015-03-27 MED ORDER — FENTANYL CITRATE (PF) 100 MCG/2ML IJ SOLN
INTRAMUSCULAR | Status: DC | PRN
  Administered 2015-03-27: 100 ug via INTRAVENOUS
  Administered 2015-03-27: 50 ug via INTRAVENOUS

## 2015-03-27 MED ORDER — PHENYLEPHRINE HCL 10 MG/ML IJ SOLN
INTRAMUSCULAR | Status: DC | PRN
  Administered 2015-03-27 (×2): 120 ug via INTRAVENOUS

## 2015-03-27 MED ORDER — SODIUM CHLORIDE 0.9 % IV SOLN
INTRAVENOUS | Status: DC
  Administered 2015-03-27: 23:00:00 via INTRAVENOUS

## 2015-03-27 MED ORDER — GLYCOPYRROLATE 0.2 MG/ML IJ SOLN
INTRAMUSCULAR | Status: DC | PRN
  Administered 2015-03-27: 0.6 mg via INTRAVENOUS

## 2015-03-27 MED ORDER — METHOCARBAMOL 500 MG PO TABS: 500.0000 mg | ORAL_TABLET | Freq: Every day | ORAL | Status: DC | PRN

## 2015-03-27 MED ORDER — SODIUM CHLORIDE 0.9 % IV SOLN: 250.0000 mL | INTRAVENOUS | Status: DC

## 2015-03-27 MED ORDER — THROMBIN 20000 UNITS EX SOLR
CUTANEOUS | Status: DC | PRN
  Administered 2015-03-27: 20:00:00 via TOPICAL

## 2015-03-27 MED ORDER — MIDAZOLAM HCL 5 MG/5ML IJ SOLN
INTRAMUSCULAR | Status: DC | PRN
  Administered 2015-03-27: 2 mg via INTRAVENOUS

## 2015-03-27 MED ORDER — NEOSTIGMINE METHYLSULFATE 10 MG/10ML IV SOLN
INTRAVENOUS | Status: DC | PRN
  Administered 2015-03-27: 3 mg via INTRAVENOUS

## 2015-03-27 MED ORDER — VITAMIN D 1000 UNITS PO TABS
2000.0000 [IU] | ORAL_TABLET | Freq: Every day | ORAL | Status: DC
  Administered 2015-03-28 – 2015-03-30 (×3): 2000 [IU] via ORAL
  Filled 2015-03-27 (×3): qty 2

## 2015-03-27 MED ORDER — HYDROCODONE-ACETAMINOPHEN 5-325 MG PO TABS
1.0000 | ORAL_TABLET | ORAL | Status: DC | PRN
  Administered 2015-03-28 (×2): 2 via ORAL
  Administered 2015-03-28 – 2015-03-30 (×2): 1 via ORAL
  Filled 2015-03-27: qty 2
  Filled 2015-03-27: qty 1
  Filled 2015-03-27: qty 2

## 2015-03-27 MED ORDER — PRAVASTATIN SODIUM 20 MG PO TABS
10.0000 mg | ORAL_TABLET | Freq: Every day | ORAL | Status: DC
  Administered 2015-03-28 – 2015-03-29 (×2): 10 mg via ORAL
  Filled 2015-03-27 (×2): qty 1

## 2015-03-27 MED ORDER — SODIUM CHLORIDE 0.9 % IJ SOLN: 3.0000 mL | INTRAMUSCULAR | Status: DC | PRN

## 2015-03-27 MED ORDER — PROPOFOL 10 MG/ML IV BOLUS
INTRAVENOUS | Status: AC
  Filled 2015-03-27: qty 20

## 2015-03-27 MED ORDER — HYDROMORPHONE HCL 1 MG/ML IJ SOLN
0.5000 mg | INTRAMUSCULAR | Status: DC | PRN
  Administered 2015-03-27: 1 mg via INTRAVENOUS
  Filled 2015-03-27 (×2): qty 1

## 2015-03-27 MED ORDER — B COMPLEX-C PO TABS
1.0000 | ORAL_TABLET | Freq: Every day | ORAL | Status: DC
  Administered 2015-03-28 – 2015-03-30 (×3): 1 via ORAL
  Filled 2015-03-27 (×3): qty 1

## 2015-03-27 MED ORDER — SPIRONOLACTONE 25 MG PO TABS
12.5000 mg | ORAL_TABLET | Freq: Every day | ORAL | Status: DC
  Administered 2015-03-28 – 2015-03-30 (×3): 12.5 mg via ORAL
  Filled 2015-03-27 (×3): qty 1

## 2015-03-27 MED ORDER — VITAMIN D 50 MCG (2000 UT) PO TABS: 2000.0000 [IU] | ORAL_TABLET | Freq: Every day | ORAL | Status: DC

## 2015-03-27 MED ORDER — ROCURONIUM BROMIDE 100 MG/10ML IV SOLN
INTRAVENOUS | Status: DC | PRN
  Administered 2015-03-27: 50 mg via INTRAVENOUS

## 2015-03-27 MED ORDER — NEOSTIGMINE METHYLSULFATE 10 MG/10ML IV SOLN
INTRAVENOUS | Status: AC
  Filled 2015-03-27: qty 4

## 2015-03-27 MED ORDER — MENTHOL 3 MG MT LOZG: 1.0000 | LOZENGE | OROMUCOSAL | Status: DC | PRN

## 2015-03-27 MED ORDER — GABAPENTIN 400 MG PO CAPS
400.0000 mg | ORAL_CAPSULE | Freq: Three times a day (TID) | ORAL | Status: DC
  Administered 2015-03-28 – 2015-03-30 (×7): 400 mg via ORAL
  Filled 2015-03-27 (×7): qty 1

## 2015-03-27 MED ORDER — CALCIUM-D 600-400 MG-UNIT PO TABS: ORAL_TABLET | Freq: Every day | ORAL | Status: DC

## 2015-03-27 MED ORDER — DIAZEPAM 5 MG PO TABS
5.0000 mg | ORAL_TABLET | Freq: Four times a day (QID) | ORAL | Status: DC | PRN
  Filled 2015-03-27: qty 1

## 2015-03-27 MED ORDER — ALBUMIN HUMAN 5 % IV SOLN
INTRAVENOUS | Status: DC | PRN
  Administered 2015-03-27: 20:00:00 via INTRAVENOUS

## 2015-03-27 MED ORDER — CALCIUM CARBONATE-VITAMIN D 500-200 MG-UNIT PO TABS
1.0000 | ORAL_TABLET | Freq: Every day | ORAL | Status: DC
  Administered 2015-03-28 – 2015-03-29 (×2): 1 via ORAL
  Filled 2015-03-27 (×3): qty 1

## 2015-03-27 MED ORDER — VECURONIUM BROMIDE 10 MG IV SOLR
INTRAVENOUS | Status: AC
  Filled 2015-03-27: qty 10

## 2015-03-27 MED ORDER — PROMETHAZINE HCL 25 MG/ML IJ SOLN: 6.2500 mg | INTRAMUSCULAR | Status: DC | PRN

## 2015-03-27 MED ORDER — PROBIOTIC DAILY PO CAPS: ORAL_CAPSULE | Freq: Two times a day (BID) | ORAL | Status: DC

## 2015-03-27 MED ORDER — BISACODYL 10 MG RE SUPP: 10.0000 mg | Freq: Every day | RECTAL | Status: DC | PRN

## 2015-03-27 MED ORDER — POLYETHYLENE GLYCOL 3350 17 G PO PACK
8.5000 g | PACK | Freq: Every day | ORAL | Status: DC | PRN
  Administered 2015-03-29: 8.5 g via ORAL
  Filled 2015-03-27: qty 1

## 2015-03-27 MED ORDER — SODIUM CHLORIDE 0.9 % IV SOLN: Freq: Once | INTRAVENOUS | Status: DC

## 2015-03-27 MED ORDER — SACCHAROMYCES BOULARDII 250 MG PO CAPS
250.0000 mg | ORAL_CAPSULE | Freq: Two times a day (BID) | ORAL | Status: DC
  Administered 2015-03-28 – 2015-03-30 (×5): 250 mg via ORAL
  Filled 2015-03-27 (×5): qty 1

## 2015-03-27 MED ORDER — PROPOFOL 10 MG/ML IV BOLUS
INTRAVENOUS | Status: DC | PRN
  Administered 2015-03-27: 140 mg via INTRAVENOUS

## 2015-03-27 MED ORDER — ONDANSETRON HCL 4 MG/2ML IJ SOLN: 4.0000 mg | INTRAMUSCULAR | Status: DC | PRN

## 2015-03-27 MED ORDER — SODIUM CHLORIDE 0.9 % IJ SOLN
3.0000 mL | Freq: Two times a day (BID) | INTRAMUSCULAR | Status: DC
  Administered 2015-03-28 – 2015-03-30 (×5): 3 mL via INTRAVENOUS

## 2015-03-27 MED ORDER — MIDAZOLAM HCL 2 MG/2ML IJ SOLN
INTRAMUSCULAR | Status: AC
  Filled 2015-03-27: qty 2

## 2015-03-27 MED ORDER — MUPIROCIN 2 % EX OINT
1.0000 "application " | TOPICAL_OINTMENT | Freq: Once | CUTANEOUS | Status: AC
  Administered 2015-03-27: 1 via TOPICAL

## 2015-03-27 MED ORDER — SENNA 8.6 MG PO TABS
1.0000 | ORAL_TABLET | Freq: Two times a day (BID) | ORAL | Status: DC
  Administered 2015-03-28 – 2015-03-30 (×3): 8.6 mg via ORAL
  Filled 2015-03-27 (×4): qty 1

## 2015-03-27 MED ORDER — SUCCINYLCHOLINE CHLORIDE 20 MG/ML IJ SOLN
INTRAMUSCULAR | Status: AC
  Filled 2015-03-27: qty 1

## 2015-03-27 MED ORDER — DEXAMETHASONE 4 MG PO TABS
4.0000 mg | ORAL_TABLET | Freq: Two times a day (BID) | ORAL | Status: DC
  Administered 2015-03-27 – 2015-03-30 (×6): 4 mg via ORAL
  Filled 2015-03-27 (×6): qty 1

## 2015-03-27 MED ORDER — ROCURONIUM BROMIDE 50 MG/5ML IV SOLN
INTRAVENOUS | Status: AC
  Filled 2015-03-27: qty 2

## 2015-03-27 MED ORDER — LACTATED RINGERS IV SOLN
INTRAVENOUS | Status: DC | PRN
  Administered 2015-03-27: 18:00:00 via INTRAVENOUS

## 2015-03-27 MED ORDER — COQ10 100 MG PO CAPS: 100.0000 mg | ORAL_CAPSULE | Freq: Every day | ORAL | Status: DC

## 2015-03-27 MED ORDER — SODIUM CHLORIDE 0.9 % IR SOLN
Status: DC | PRN
  Administered 2015-03-27: 20:00:00

## 2015-03-27 MED ORDER — MUPIROCIN 2 % EX OINT
TOPICAL_OINTMENT | CUTANEOUS | Status: AC
  Administered 2015-03-27: 1 via TOPICAL
  Filled 2015-03-27: qty 22

## 2015-03-27 MED ORDER — ACETAMINOPHEN 650 MG RE SUPP
650.0000 mg | RECTAL | Status: DC | PRN
Start: 2015-03-27 — End: 2015-03-30

## 2015-03-27 MED ORDER — MEPERIDINE HCL 25 MG/ML IJ SOLN: 6.2500 mg | INTRAMUSCULAR | Status: DC | PRN

## 2015-03-27 MED ORDER — LACTATED RINGERS IV SOLN
INTRAVENOUS | Status: DC | PRN
  Administered 2015-03-27 (×2): via INTRAVENOUS

## 2015-03-27 MED ORDER — THROMBIN 5000 UNITS EX SOLR
OROMUCOSAL | Status: DC | PRN
  Administered 2015-03-27 (×2): via TOPICAL

## 2015-03-27 MED ORDER — HYDROMORPHONE HCL 1 MG/ML IJ SOLN: 0.2500 mg | INTRAMUSCULAR | Status: DC | PRN

## 2015-03-27 MED ORDER — VANCOMYCIN HCL IN DEXTROSE 1-5 GM/200ML-% IV SOLN: 1000.0000 mg | INTRAVENOUS | Status: DC

## 2015-03-27 MED ORDER — B COMPLEX PO TABS: 1.0000 | ORAL_TABLET | Freq: Every day | ORAL | Status: DC

## 2015-03-27 MED ORDER — ZOLPIDEM TARTRATE 5 MG PO TABS: 5.0000 mg | ORAL_TABLET | Freq: Every evening | ORAL | Status: DC | PRN

## 2015-03-27 MED ORDER — DOCUSATE SODIUM 100 MG PO CAPS
100.0000 mg | ORAL_CAPSULE | Freq: Two times a day (BID) | ORAL | Status: DC
  Administered 2015-03-28 – 2015-03-30 (×4): 100 mg via ORAL
  Filled 2015-03-27 (×5): qty 1

## 2015-03-27 MED ORDER — FAMOTIDINE 20 MG PO TABS
40.0000 mg | ORAL_TABLET | Freq: Every day | ORAL | Status: DC
  Administered 2015-03-28 – 2015-03-29 (×2): 40 mg via ORAL
  Filled 2015-03-27 (×2): qty 2

## 2015-03-27 MED ORDER — PHENYLEPHRINE HCL 10 MG/ML IJ SOLN
INTRAMUSCULAR | Status: AC
Start: 2015-03-27 — End: 2015-03-27
  Filled 2015-03-27: qty 1

## 2015-03-27 MED ORDER — ONDANSETRON HCL 4 MG/2ML IJ SOLN
INTRAMUSCULAR | Status: DC | PRN
  Administered 2015-03-27: 4 mg via INTRAVENOUS

## 2015-03-27 MED ORDER — 0.9 % SODIUM CHLORIDE (POUR BTL) OPTIME
TOPICAL | Status: DC | PRN
  Administered 2015-03-27: 1000 mL

## 2015-03-27 MED ORDER — TRAMADOL HCL 50 MG PO TABS
50.0000 mg | ORAL_TABLET | Freq: Four times a day (QID) | ORAL | Status: DC | PRN
  Administered 2015-03-27 – 2015-03-29 (×3): 50 mg via ORAL
  Filled 2015-03-27 (×3): qty 1

## 2015-03-27 MED ORDER — FENTANYL CITRATE (PF) 250 MCG/5ML IJ SOLN
INTRAMUSCULAR | Status: AC
  Filled 2015-03-27: qty 5

## 2015-03-27 MED ORDER — PHENOL 1.4 % MT LIQD
1.0000 | OROMUCOSAL | Status: DC | PRN
Start: 2015-03-27 — End: 2015-03-30

## 2015-03-27 MED ORDER — PHENYLEPHRINE HCL 10 MG/ML IJ SOLN
10.0000 mg | INTRAMUSCULAR | Status: DC | PRN
  Administered 2015-03-27: 80 ug/min via INTRAVENOUS

## 2015-03-27 SURGICAL SUPPLY — 62 items
BAG DECANTER FOR FLEXI CONT (MISCELLANEOUS) ×2 IMPLANT
BENZOIN TINCTURE PRP APPL 2/3 (GAUZE/BANDAGES/DRESSINGS) IMPLANT
BLADE SURG 11 STRL SS (BLADE) ×2 IMPLANT
BLADE ULTRA TIP 2M (BLADE) ×2 IMPLANT
BRUSH SCRUB EZ 1% IODOPHOR (MISCELLANEOUS) IMPLANT
BUR ACRON 5.0MM COATED (BURR) ×2 IMPLANT
BUR MATCHSTICK NEURO 3.0 LAGG (BURR) ×2 IMPLANT
CANISTER SUCT 3000ML PPV (MISCELLANEOUS) ×2 IMPLANT
CLIP TI MEDIUM 6 (CLIP) IMPLANT
COVER MAYO STAND STRL (DRAPES) IMPLANT
DERMABOND ADVANCED (GAUZE/BANDAGES/DRESSINGS) ×1
DERMABOND ADVANCED .7 DNX12 (GAUZE/BANDAGES/DRESSINGS) ×1 IMPLANT
DRAPE LAPAROTOMY 100X72 PEDS (DRAPES) ×2 IMPLANT
DRAPE LAPAROTOMY 100X72X124 (DRAPES) IMPLANT
DRAPE MICROSCOPE LEICA (MISCELLANEOUS) ×2 IMPLANT
DRAPE POUCH INSTRU U-SHP 10X18 (DRAPES) ×2 IMPLANT
DRSG EMULSION OIL 3X3 NADH (GAUZE/BANDAGES/DRESSINGS) IMPLANT
DRSG OPSITE 4X5.5 SM (GAUZE/BANDAGES/DRESSINGS) ×2 IMPLANT
DRSG OPSITE POSTOP 4X8 (GAUZE/BANDAGES/DRESSINGS) ×2 IMPLANT
ELECT REM PT RETURN 9FT ADLT (ELECTROSURGICAL) ×2
ELECTRODE REM PT RTRN 9FT ADLT (ELECTROSURGICAL) ×1 IMPLANT
EVACUATOR 1/8 PVC DRAIN (DRAIN) IMPLANT
EVACUATOR 3/16  PVC DRAIN (DRAIN) ×1
EVACUATOR 3/16 PVC DRAIN (DRAIN) ×1 IMPLANT
GAUZE SPONGE 4X4 12PLY STRL (GAUZE/BANDAGES/DRESSINGS) IMPLANT
GAUZE SPONGE 4X4 16PLY XRAY LF (GAUZE/BANDAGES/DRESSINGS) IMPLANT
GLOVE BIOGEL PI IND STRL 7.5 (GLOVE) ×2 IMPLANT
GLOVE BIOGEL PI INDICATOR 7.5 (GLOVE) ×2
GLOVE ECLIPSE 7.0 STRL STRAW (GLOVE) ×4 IMPLANT
GOWN STRL REUS W/ TWL LRG LVL3 (GOWN DISPOSABLE) ×2 IMPLANT
GOWN STRL REUS W/ TWL XL LVL3 (GOWN DISPOSABLE) IMPLANT
GOWN STRL REUS W/TWL 2XL LVL3 (GOWN DISPOSABLE) IMPLANT
GOWN STRL REUS W/TWL LRG LVL3 (GOWN DISPOSABLE) ×2
GOWN STRL REUS W/TWL XL LVL3 (GOWN DISPOSABLE)
HEMOSTAT POWDER KIT SURGIFOAM (HEMOSTASIS) ×4 IMPLANT
HEMOSTAT SURGICEL 2X14 (HEMOSTASIS) IMPLANT
KIT BASIN OR (CUSTOM PROCEDURE TRAY) ×2 IMPLANT
KIT ROOM TURNOVER OR (KITS) ×2 IMPLANT
NEEDLE HYPO 22GX1.5 SAFETY (NEEDLE) ×2 IMPLANT
NEEDLE SPNL 18GX3.5 QUINCKE PK (NEEDLE) ×4 IMPLANT
NS IRRIG 1000ML POUR BTL (IV SOLUTION) ×2 IMPLANT
PACK LAMINECTOMY NEURO (CUSTOM PROCEDURE TRAY) ×2 IMPLANT
PAD ARMBOARD 7.5X6 YLW CONV (MISCELLANEOUS) ×6 IMPLANT
PATTIES SURGICAL .25X.25 (GAUZE/BANDAGES/DRESSINGS) IMPLANT
PATTIES SURGICAL .5 X3 (DISPOSABLE) IMPLANT
PATTIES SURGICAL 1/4 X 3 (GAUZE/BANDAGES/DRESSINGS) IMPLANT
RUBBERBAND STERILE (MISCELLANEOUS) ×4 IMPLANT
SPECIMEN JAR SMALL (MISCELLANEOUS) IMPLANT
SPONGE LAP 4X18 X RAY DECT (DISPOSABLE) IMPLANT
SPONGE NEURO XRAY DETECT 1X3 (DISPOSABLE) IMPLANT
SPONGE SURGIFOAM ABS GEL 100 (HEMOSTASIS) ×2 IMPLANT
STRIP CLOSURE SKIN 1/4X4 (GAUZE/BANDAGES/DRESSINGS) IMPLANT
SUT PROLENE 6 0 BV (SUTURE) IMPLANT
SUT VIC AB 0 CT1 18XCR BRD8 (SUTURE) ×2 IMPLANT
SUT VIC AB 0 CT1 8-18 (SUTURE) ×2
SUT VIC AB 2-0 CP2 18 (SUTURE) IMPLANT
SUT VICRYL 3-0 RB1 18 ABS (SUTURE) ×6 IMPLANT
TIP SONASTAR STD MISONIX 1.9 (TRAY / TRAY PROCEDURE) IMPLANT
TOWEL OR 17X24 6PK STRL BLUE (TOWEL DISPOSABLE) IMPLANT
TOWEL OR 17X26 10 PK STRL BLUE (TOWEL DISPOSABLE) ×2 IMPLANT
TRAY FOLEY W/METER SILVER 14FR (SET/KITS/TRAYS/PACK) ×2 IMPLANT
WATER STERILE IRR 1000ML POUR (IV SOLUTION) ×2 IMPLANT

## 2015-03-27 NOTE — Addendum Note (Signed)
Addendum  created 03/27/15 2301 by Carney Living, CRNA   Modules edited: PRL Based Order Sets

## 2015-03-27 NOTE — H&P (Signed)
CC:  Spine tumor  HPI: Heather [name] is a 69 y.o. female who initially presented about 6 weeks ago with bilateral leg weakness. She was found to have a metastatic lesion at T5 with spinal cord compression. She underwent decompression and stabilization and subsequently went to CIR. She has since been discharged home and currently requires a wheelchair. She underwent f/u MRI as planning for spine radiation and was found to have continued cord compression. After her case was discussed at the multidisciplinary conference, we recommended repeat decompression.   PMH: Past Medical History  Diagnosis Date  . Chronic kidney disease   . Hx: UTI (urinary tract infection)   . Right shoulder pain   . Right knee pain   . Hypertension     recently started Aldactone   . Hyperlipidemia     but not on meds;diet and exercise controlled  . Dizziness     has been going on for 17month;medical MD aware  . H/O hiatal hernia   . GERD (gastroesophageal reflux disease)     Tums prn  . Hemorrhoids   . Urinary frequency     d/t taking Aldactone  . History of UTI   . Anemia     hx of  . Cataract     immature-not sure of which eye  . Anxiety     r/t updated surgery  . Insomnia     takes Melatoniin daily  . Breast cancer ([place]     right  . Skin cancer   . Hx of radiation therapy 10/25/12- 12/11/12    right chest wall/regional lymph nodes 5040 cGy, 28 sessions, right chest wall boost 1000 cGy 1 session  . H. pylori infection   . Metastasis to spinal column (HCC)     T5  . Complication of anesthesia     pt states she is sensitive to meds  . PONV (postoperative nausea and vomiting)     pt experienced hair loss, confusion and combative    PSH: Past Surgical History  Procedure Laterality Date  . Cyst removed from left breast  1970  . Left wrist surgery   2004    with plate  . Colonoscopy    . Esophagogastroduodenoscopy    . Mastectomy, radical    . Mastectomy modified radical  05/10/2012   Procedure: MASTECTOMY MODIFIED RADICAL;  Surgeon: PMerrie Roof MD;  [facility]  Service: General;  Laterality: Right;  RIGHT MODIFIED RADICAL MASTECTOMY  . Portacath placement  05/10/2012    Procedure: INSERTION PORT-A-CATH;  Surgeon: PMerrie Roof MD;  [facility]  Service: General;  Laterality: Left;  . Decompressive lumbar laminectomy level 4 N/A 02/15/2015    Procedure: DECOMPRESSION T5 AND T3-T7 STABALIZATION;  Surgeon: NConsuella Lose MD;  [facility];  Service: Neurosurgery;  Laterality: N/A;    SH: Social History  Substance Use Topics  . Smoking status: Never Smoker   . Smokeless tobacco: Never Used  . Alcohol Use: No    MEDS: Prior to Admission medications   Medication Sig Start Date End Date Taking? Authorizing Provider  b complex vitamins tablet Take 1 tablet by mouth daily.   Yes Historical Provider, MD  Calcium Carbonate Antacid (TUMS PO) Take 2 tablets by mouth 2 (two) times daily as needed (acid reflux).   Yes Historical Provider, MD  Calcium Carbonate-Vitamin D (CALCIUM-D PO) Take 1 tablet by mouth daily.   Yes Historical Provider, MD  Cholecalciferol (VITAMIN D) 2000 UNITS  tablet Take 2,000 Units by mouth daily.   Yes Historical Provider, MD  Coenzyme Q10 (COQ10) 100 MG CAPS Take 100 mg by mouth at bedtime.   Yes Historical Provider, MD  dexamethasone (DECADRON) 4 MG tablet Take 1 tablet (4 mg total) by mouth 2 (two) times daily. 03/11/15  Yes Ivan Anchors Love, PA-C  gabapentin (NEURONTIN) 400 MG capsule Take 1 capsule (400 mg total) by mouth 3 (three) times daily. 03/11/15  Yes Ivan Anchors Love, PA-C  methocarbamol (ROBAXIN) 500 MG tablet Take 1 tablet (500 mg total) by mouth daily as needed for muscle spasms. 03/11/15  Yes Ivan Anchors Love, PA-C  OVER THE COUNTER MEDICATION Place 1 drop into both eyes 2 (two) times daily as needed (dry eyes). Over the counter lubricating eye drops   Yes Historical Provider, MD  polyethylene glycol (MIRALAX / GLYCOLAX)  packet Take 17 g by mouth 2 (two) times daily. Patient taking differently: Take 8.5 g by mouth daily as needed (constipation).  03/11/15  Yes Ivan Anchors Love, PA-C  pravastatin (PRAVACHOL) 10 MG tablet Take 10 mg by mouth at bedtime.  03/14/14  Yes Historical Provider, MD  Probiotic Product (PROBIOTIC DAILY PO) Take 1 capsule by mouth 2 (two) times daily.    Yes Historical Provider, MD  ranitidine (ZANTAC) 300 MG tablet Take 300 mg by mouth at bedtime.    Yes Historical Provider, MD  spironolactone (ALDACTONE) 25 MG tablet Take 0.5 tablets (12.5 mg total) by mouth daily. 04/15/13  Yes Minette Headland, NP  traMADol (ULTRAM) 50 MG tablet Take 1 tablet (50 mg total) by mouth every 6 (six) hours as needed for moderate pain. 03/11/15  Yes Ivan Anchors Love, PA-C  anastrozole (ARIMIDEX) 1 MG tablet Take 1 tablet (1 mg total) by mouth daily. 03/19/15   [name], MD  apixaban (ELIQUIS) 2.5 MG TABS tablet Take 1 tablet (2.5 mg total) by mouth 2 (two) times daily. 03/12/15   Bary Leriche, PA-C  lidocaine-prilocaine (EMLA) cream Apply to affected area once Patient taking differently: Apply 1 application topically every 21 ( twenty-one) days. Apply to port prior to infusions 03/23/15   [name], MD    ALLERGY: Allergies  Allergen Reactions  . Codeine     " loopy".   02/26/15: Also patient does not want to take oxycodone either (makes her feel like a "zombie")  . Keflex [Cephalexin]     GI Upset  . Naprosyn [Naproxen] Other (See Comments)    GI ipset   . Oxycodone Other (See Comments)    Made her feel like a "zombie" 02/25/15. Patient does not want to take oxycodone.  Cecil Cranker [Guaifenesin] Other (See Comments)    Dizzy     ROS: ROS  NEUROLOGIC EXAM: Awake, alert, oriented Memory and concentration grossly intact Speech fluent, appropriate CN grossly intact Motor exam: Upper Extremities Deltoid Bicep Tricep Grip  Right 5/5 5/5 5/5 5/5  Left 5/5 5/5 5/5 5/5   Lower Extremity IP Quad PF DF  EHL  Right 3/5 3/5 4/5 4-/5 4-/5  Left 3/5 3/5 4/5 4-/5 4-/5   Sensation grossly intact to LT   IMPRESSION: - 69 y.o. female with continued cord compression from metastatic T5 breast met  PLAN: - T4-T6 laminectomy   I reviewed the MRI findings with the patient and her family. Recommendation for repeat surgery was discussed. Risks of the surgery were reviewed. After all questions were answered, consent was obtained.

## 2015-03-27 NOTE — Anesthesia Procedure Notes (Addendum)
Procedure Name: Intubation Date/Time: 03/27/2015 6:46 PM Performed by: Shirlyn Goltz Pre-anesthesia Checklist: Patient identified, Emergency Drugs available, Suction available and Patient being monitored Patient Re-evaluated:Patient Re-evaluated prior to inductionOxygen Delivery Method: Circle system utilized Preoxygenation: Pre-oxygenation with 100% oxygen Intubation Type: IV induction Laryngoscope Size: Mac and 3 Grade View: Grade II Tube type: Oral Tube size: 7.0 mm Number of attempts: 1 Airway Equipment and Method: Stylet Placement Confirmation: ETT inserted through vocal cords under direct vision,  positive ETCO2 and breath sounds checked- equal and bilateral Secured at: 21 cm Tube secured with: Tape Dental Injury: Teeth and Oropharynx as per pre-operative assessment

## 2015-03-27 NOTE — Anesthesia Postprocedure Evaluation (Signed)
  Anesthesia Post-op Note  Patient: Heather [name]  Procedure(s) Performed: Procedure(s) with comments: Thoracic Four-Thoracic Six Laminectomy for tumor (N/A) - T4-T6 Laminectomy  Patient Location: PACU  Anesthesia Type: General   Level of Consciousness: awake, alert  and oriented  Airway and Oxygen Therapy: Patient Spontanous Breathing  Post-op Pain: mild  Post-op Assessment: Post-op Vital signs reviewed  Post-op Vital Signs: Reviewed  Last Vitals:  Filed Vitals:   03/27/15 2130  BP: 108/61  Pulse: 67  Temp:   Resp: 10    Complications: No apparent anesthesia complications

## 2015-03-27 NOTE — Op Note (Signed)
PREOP DIAGNOSIS:  1. Metastatic Spine tumor 2. Spinal cord compression  POSTOP DIAGNOSIS: Same  PROCEDURE: 1. T4-T6 laminectomy for decompression of spinal cord  SURGEON: Dr. Consuella Lose, MD  ASSISTANT: Dr. Veatrice Kells, MD  ANESTHESIA: General Endotracheal  EBL: 800cc  SPECIMENS: None  DRAINS: subfacial hemovac  COMPLICATIONS: None immediate  CONDITION: Stable to PCAU  HISTORY: Heather [name] is a 69 y.o. female with a history of metastatic breast cancer who underwent decompression and stabilization of a T5 metastatic lesion approximately 6 weeks ago. She underwent MRI scan for radiation planning which demonstrated continued spinal cord compression. Her case was discussed at the multidisciplinary oncology conference, and the consensus recommendation was to undergo repeat surgery for decompression. The risks and benefits of the surgery were explained in detail to the patient and her family. After all questions were answered, informed consent was obtained.  PROCEDURE IN DETAIL: After informed consent was obtained and witnessed, the patient was brought to the operating room. After induction of general anesthesia, the patient was positioned on the operative table in the prone position with all pressure points meticulously padded. The previous incision was then identified and prepped and draped in the usual sterile fashion.  After timeout was conducted, the midportion of the previous incision was opened sharply. There was a small amount of hematoma underneath the skin, in the suprafascial space. The fascia was then incised, and there was again a small amount of hematoma identified. The T4 and T6 pedicle screws as well as the rod was then identified, and a portion of the paraspinal musculature had to be resected in order to place self-retaining retractors for visualization.  There did not appear to be any significant tumor in the dorsal epidural space, however there was a fair  amount of tissue which was likely the DuraSeal and dural onlay graft which was placed at the prior surgery. Utilizing a high-speed drill, T4 laminectomy, and superior T6 laminectomy was completed. Normal dura was then found at the T4 and T6 levels. All tissue dorsal to the dura was then removed.  At this point, the thecal sac was traced down laterally, and there was noted to be a significant amount of tumor which appeared to be compressing the thecal sac laterally, and infiltrating the paraspinal musculature on the right side. The T5 nerve root was identified, and all the tumor lateral to the thecal sac on the right was removed. At this point, utilizing a combination of curettes and rongeurs, partial corpectomy was completed, including good decompression of the ventral epidural space. Decompression was confirmed with a long ball tip dissector. In a similar fashion, tumor was removed from the left side of the lateral edge of the thecal sac. The ventral epidural space was then entered from the left side, and again, good decompression was confirmed. We were able to pass a long ball tip dissector in the ventral epidural space from T4-T6 confirming good ventral decompression.  At this point, having completed circumferential decompression of thecal sac, hemostasis was achieved with primarily morcellized Gelfoam with thrombin. The wound was then irrigated with copious amounts of antibiotic and normal saline irrigation. A Hemovac drain was then placed and tunneled subcutaneously. The wound was then closed in layers using a combination of 0 and 3-0 Vicryl stitches. The wound was then closed with Dermabond, and sterile dressing was applied. The patient was then transferred to the stretcher, extubated, and taken to the postanesthesia care unit in stable hemodynamic condition. At the end of the case  all sponge, needle, instrument, and cottonoid counts were correct.

## 2015-03-27 NOTE — Anesthesia Preprocedure Evaluation (Addendum)
Anesthesia Evaluation  Patient identified by MRN, date of birth, ID band Patient awake    Reviewed: Allergy & Precautions, NPO status , Patient's Chart, lab work & pertinent test results  History of Anesthesia Complications (+) PONV and history of anesthetic complications  Airway Mallampati: I  TM Distance: >3 FB Neck ROM: Full    Dental  (+) Teeth Intact, Dental Advisory Given   Pulmonary    breath sounds clear to auscultation       Cardiovascular hypertension, Pt. on medications  Rhythm:Regular Rate:Normal     Neuro/Psych    GI/Hepatic hiatal hernia, GERD  Medicated and Controlled,  Endo/Other    Renal/GU Renal disease     Musculoskeletal   Abdominal   Peds  Hematology  (+) anemia ,   Anesthesia Other Findings PONV at last op but she believes it was due to oral vitamins being given immediately post op.  Reproductive/Obstetrics                          Anesthesia Physical  Anesthesia Plan  ASA: III  Anesthesia Plan: General   Post-op Pain Management:    Induction: Intravenous  Airway Management Planned: Oral ETT  Additional Equipment:   Intra-op Plan:   Post-operative Plan: Extubation in OR  Informed Consent: I have reviewed the patients History and Physical, chart, labs and discussed the procedure including the risks, benefits and alternatives for the proposed anesthesia with the patient or authorized representative who has indicated his/her understanding and acceptance.   Dental advisory given  Plan Discussed with: CRNA, Anesthesiologist and Surgeon  Anesthesia Plan Comments:       Anesthesia Quick Evaluation

## 2015-03-27 NOTE — Transfer of Care (Signed)
Immediate Anesthesia Transfer of Care Note  Patient: Heather [name]  Procedure(s) Performed: Procedure(s) with comments: Thoracic Four-Thoracic Six Laminectomy for tumor (N/A) - T4-T6 Laminectomy  Patient Location: PACU  Anesthesia Type:General  Level of Consciousness: awake, alert , oriented and patient cooperative  Airway & Oxygen Therapy: Patient Spontanous Breathing and Patient connected to nasal cannula oxygen  Post-op Assessment: Report given to RN, Post -op Vital signs reviewed and stable and Patient moving all extremities X 4  Post vital signs: Reviewed and stable  Last Vitals:  Filed Vitals:   03/27/15 1425  BP: 156/83  Pulse: 71  Temp: 36.4 C  Resp: 18    Complications: No apparent anesthesia complications

## 2015-03-28 LAB — BASIC METABOLIC PANEL
Anion gap: 8 (ref 5–15)
BUN: 24 mg/dL — ABNORMAL HIGH (ref 6–20)
CO2: 28 mmol/L (ref 22–32)
Calcium: 7.8 mg/dL — ABNORMAL LOW (ref 8.9–10.3)
Chloride: 99 mmol/L — ABNORMAL LOW (ref 101–111)
Creatinine, Ser: 0.78 mg/dL (ref 0.44–1.00)
GFR calc Af Amer: >60 mL/min (ref >60)
GFR calc non Af Amer: >60 mL/min (ref >60)
Glucose, Bld: 159 mg/dL — ABNORMAL HIGH (ref 65–99)
Potassium: 3.8 mmol/L (ref 3.5–5.1)
Sodium: 135 mmol/L (ref 135–145)

## 2015-03-28 LAB — CBC
HCT: 23.7 % — ABNORMAL LOW (ref 36.0–46.0)
Hemoglobin: 7.5 g/dL — ABNORMAL LOW (ref 12.0–15.0)
MCH: 29.2 pg (ref 26.0–34.0)
MCHC: 31.6 g/dL (ref 30.0–36.0)
MCV: 92.2 fL (ref 78.0–100.0)
Platelets: 138 10*3/uL — ABNORMAL LOW (ref 150–400)
RBC: 2.57 MIL/uL — ABNORMAL LOW (ref 3.87–5.11)
RDW: 17.5 % — ABNORMAL HIGH (ref 11.5–15.5)
WBC: 4.2 10*3/uL (ref 4.0–10.5)

## 2015-03-28 MED ORDER — ALUM & MAG HYDROXIDE-SIMETH 200-200-20 MG/5ML PO SUSP
30.0000 mL | ORAL | Status: DC | PRN
  Filled 2015-03-28: qty 30

## 2015-03-28 NOTE — Evaluation (Signed)
Physical Therapy Evaluation Patient Details Name: Heather [name] MRN: [id number] DOB: February 15, 1946 Today's Date: 03/28/2015   History of Present Illness  69 y.o. female s/p Thoracic Four-Thoracic Six Laminectomy for tumor. PMH significant for breast CA s/p mastectomy  Clinical Impression  Patient is s/p above surgery presenting with functional limitations due to the deficits listed below (see PT Problem List). Demonstrates good UE strength and ability to mobilize to EOB with min assist. Requires mod assist for squat pivot transfer to chair from bed. States she feels at baseline with her transfer ability this AM. Husband typically provides assist with transfers at home using squat pivot technique vs Steady. Would like to review with patient and her husband prior to d/c to insure they feel confident with these tasks. Patient will benefit from skilled PT to increase their independence and safety with mobility to allow discharge to the venue listed below.       Follow Up Recommendations Home health PT;Supervision/Assistance - 24 hour    Equipment Recommendations  None recommended by PT    Recommendations for Other Services       Precautions / Restrictions Precautions Precautions: Fall;Back Precaution Booklet Issued: Yes (comment) Precaution Comments: reviewed handout Restrictions Weight Bearing Restrictions: No      Mobility  Bed Mobility Overal bed mobility: Needs Assistance Bed Mobility: Rolling;Sidelying to Sit Rolling: Supervision Sidelying to sit: Min assist       General bed mobility comments: Cues for log roll technique. Pt able to flex legs for roll and bring off of bed. Min assist for truncal support and balance to rise to EOB.  Transfers Overall transfer level: Needs assistance Equipment used: None Transfers: Squat Pivot Transfers     Squat pivot transfers: Mod assist     General transfer comment: Mod assist for knee block and boost to pivot to recliner from bed.  VC for technique. Demonstrates good effort with LEs but notably weak.  Ambulation/Gait                Stairs            Wheelchair Mobility    Modified Rankin (Stroke Patients Only)       Balance Overall balance assessment: Needs assistance Sitting-balance support: Single extremity supported Sitting balance-Leahy Scale: Poor Sitting balance - Comments: Sat EOB x 5 minutes intermittently requring assist for balance but progressed to single UE support for truncal control.                                     Pertinent Vitals/Pain Pain Assessment: 0-10 Pain Score: 3  Pain Location: Rt side of chest Pain Descriptors / Indicators: Sharp Pain Intervention(s): Monitored during session;Repositioned    Home Living Family/patient expects to be discharged to:: Private residence Living Arrangements: Spouse/significant other Available Help at Discharge: Family;Available 24 hours/day Type of Home: House Home Access: Ramped entrance     Home Layout: Able to live on main level with bedroom/bathroom;Two level Home Equipment: Walker - 2 wheels;Bedside commode;Shower seat;Wheelchair - manual      Prior Function Level of Independence: Needs assistance   Gait / Transfers Assistance Needed: non ambulatory. Needs assist with squat pivot transfer  ADL's / Homemaking Assistance Needed: bath self, assist with LE dressing from husband.  Comments: Finished CIR in early november, husband has been assisting at home since. Pt does not ambulate     Hand Dominance   Dominant  Hand: Right    Extremity/Trunk Assessment   Upper Extremity Assessment: Defer to OT evaluation           Lower Extremity Assessment: RLE deficits/detail;LLE deficits/detail RLE Deficits / Details: grossly 3/5 throughout, Rt ankle dorsiflexion and lt hip flexion 3-/5 LLE Deficits / Details: Grossly 3/5 throughout, Lt hip flexion 2/5.     Communication   Communication: No difficulties   Cognition Arousal/Alertness: Awake/alert Behavior During Therapy: WFL for tasks assessed/performed Overall Cognitive Status: Within Functional Limits for tasks assessed                      General Comments General comments (skin integrity, edema, etc.): Pt reports she feels her ability to transfer with squat pivot transfer is at baseline. She performs this technique with husband or uses Steady. Prefers to use Steady with nursing staff.    Exercises        Assessment/Plan    PT Assessment Patient needs continued PT services  PT Diagnosis Acute pain;Generalized weakness;Difficulty walking   PT Problem List Decreased strength;Decreased activity tolerance;Decreased balance;Decreased mobility;Decreased coordination;Decreased knowledge of use of DME;Pain  PT Treatment Interventions DME instruction;Therapeutic activities;Therapeutic exercise;Neuromuscular re-education;Patient/family education;Functional mobility training;Balance training   PT Goals (Current goals can be found in the Care Plan section) Acute Rehab PT Goals Patient Stated Goal: Go home PT Goal Formulation: With patient Time For Goal Achievement: 04/11/15 Potential to Achieve Goals: Good    Frequency Min 5X/week   Barriers to discharge        Co-evaluation               End of Session Equipment Utilized During Treatment: Gait belt Activity Tolerance: Patient tolerated treatment well Patient left: in chair;with call bell/phone within reach;with SCD's reapplied Nurse Communication: Mobility status;Need for lift equipment (Steady)         Time: 0092-3300 PT Time Calculation (min) (ACUTE ONLY): 26 min   Charges:   PT Evaluation $Initial PT Evaluation Tier I: 1 Procedure PT Treatments $Therapeutic Activity: 8-22 mins   PT G Codes:        Ellouise Newer 03/28/2015, 12:35 PM Camille Bal [place], [place]

## 2015-03-28 NOTE — Clinical Social Work Note (Signed)
CSW consult acknowledged:  Clinical Education officer, museum received consult for SNF placement. PT currently recommending Home Health.  Clinical Social Worker will sign off for now as social work intervention is no longer needed. Please consult [place] again if new need arises.  Glendon Axe, MSW, LCSWA [phone] 03/28/2015 2:59 PM

## 2015-03-28 NOTE — Progress Notes (Signed)
Occupational Therapy Evaluation Patient Details Name: ANALYCIA KHOKHAR MRN: [id number] DOB: [birth year] Today's Date: 03/28/2015    History of Present Illness 69 y.o. female s/p Thoracic Four-Thoracic Six Laminectomy for tumor. PMH significant for breast CA s/p mastectomy   Clinical Impression   Pt admitted with the above diagnoses and presents with below problem list. Pt will benefit from continued acute OT to address the below listed deficits and maximize independence with BADLs prior to d/c to venue below. PTA pt needed assist with transfers with spouse either assisting with squat pivot or using Steady, mod A with LB dressing, reports needing setup assist for UB/LB bathing. This session pt was +2 mod physical assist using bariatric steady to complete sit<>stand to facilitate transfer from recliner to EOB. Pt needing encouragement at times, reporting that she feels her UB strength is weaker than baseline. Spouse present for evaluation. Session details below. Feel pt will progress during acute stay to a level to d/c to home with [facility] therapies, however if pt does not progress well may need to consider d/c to skilled care venue for further rehab prior to returning home. OT to continue to follow acutely.      Follow Up Recommendations  Supervision/Assistance - 24 hour;Home health OT    Equipment Recommendations  None recommended by OT    Recommendations for Other Services       Precautions / Restrictions Precautions Precautions: Fall;Back Precaution Booklet Issued: Yes (comment) Precaution Comments: reviewed Restrictions Weight Bearing Restrictions: No      Mobility Bed Mobility Overal bed mobility: Needs Assistance Bed Mobility: Rolling;Sit to Sidelying Rolling: Supervision Sidelying to sit: Min assist     Sit to sidelying: Mod assist General bed mobility comments: assist to advance LEs.   Transfers Overall transfer level: Needs assistance Equipment used: Ambulation equipment  used Transfers: Sit to/from Stand Sit to Stand: Mod assist;+2 physical assistance   Squat pivot transfers: Mod assist     General transfer comment: successful with bariatric steady, see ADL comments above for details    Balance Overall balance assessment: Needs assistance Sitting-balance support: Bilateral upper extremity supported;Feet supported Sitting balance-Leahy Scale: Poor Sitting balance - Comments: Sat EOB x 5 minutes intermittently requring assist for balance but progressed to single UE support for truncal control.   Standing balance support: Bilateral upper extremity supported;During functional activity Standing balance-Leahy Scale: Poor                              ADL Overall ADL's : Needs assistance/impaired Eating/Feeding: Set up;Sitting   Grooming: Set up;Sitting   Upper Body Bathing: Set up;Sitting   Lower Body Bathing: Moderate assistance;Sitting/lateral leans   Upper Body Dressing : Set up;Sitting   Lower Body Dressing: Moderate assistance;+2 for physical assistance;Sit to/from stand Lower Body Dressing Details (indicate cue type and reason): using bariatric steady Toilet Transfer: Moderate assistance;+2 for physical assistance;BSC (bariatric steady)   Toileting- Clothing Manipulation and Hygiene: Moderate assistance;Sitting/lateral lean   Tub/ Shower Transfer: Moderate assistance;+2 for physical assistance;3 in 1 (bariatric steady)     General ADL Comments: Pt completed transfer from recliner to EOB using bariatric steady, Initially attempted with regular steady but due to environmental contraints of bar under recliner was unable to position LEs far enough up into position to block knees. Pt reports the steady used at home has swinging seat and blocks her feet in the front. Reattempted transfer with bariatric steady and blocked feet. Pt's spouse  providing +2 assist. Pt returned quickly to seated position after coming up paritially reporting she  was too weak and felt she couldn't stand. With encouragement pt reattempted and with +2 mod A under arms and at iscium pt able to complet stand with bariatric steady.      Vision     Perception     Praxis      Pertinent Vitals/Pain Pain Assessment: 0-10 Pain Score: 3  Pain Location: back Pain Descriptors / Indicators: Sharp Pain Intervention(s): Monitored during session;Repositioned     Hand Dominance Right   Extremity/Trunk Assessment Upper Extremity Assessment Upper Extremity Assessment: Generalized weakness (Pt reports weaker than baseline.)         Communication Communication Communication: No difficulties   Cognition Arousal/Alertness: Awake/alert Behavior During Therapy: WFL for tasks assessed/performed;Anxious Overall Cognitive Status: Within Functional Limits for tasks assessed                     General Comments       Exercises       Shoulder Instructions      Home Living Family/patient expects to be discharged to:: Private residence Living Arrangements: Spouse/significant other Available Help at Discharge: Family;Available 24 hours/day Type of Home: House Home Access: Ramped entrance Entrance Stairs-Number of Steps: 1 Entrance Stairs-Rails: None Home Layout: Able to live on main level with bedroom/bathroom;Two level     Bathroom Shower/Tub: Astronomer Accessibility: Yes How Accessible: Accessible via wheelchair Home Equipment: [place] - 2 wheels;Bedside commode;Shower seat;Wheelchair - manual;Other (comment) (Steady (with swing out seat - bariatric?))   Additional Comments: w/c at baseline  Lives With: Spouse    Prior Functioning/Environment Level of Independence: Needs assistance  Gait / Transfers Assistance Needed: non ambulatory. Needs assist with squat pivot transfer or used steady ADL's / Homemaking Assistance Needed: spouse assisted with toilet/shower transfers; bath self with setup, assist with LE dressing  from husband.   Comments: Finished CIR in early november, husband has been assisting at home since. Pt does not ambulate    OT Diagnosis: Generalized weakness;Acute pain   OT Problem List: Decreased strength;Decreased activity tolerance;Impaired balance (sitting and/or standing);Decreased knowledge of use of DME or AE;Decreased knowledge of precautions;Pain   OT Treatment/Interventions: Self-care/ADL training;DME and/or AE instruction;Therapeutic activities;Patient/family education;Balance training    OT Goals(Current goals can be found in the care plan section) Acute Rehab OT Goals Patient Stated Goal: Go home OT Goal Formulation: With patient Time For Goal Achievement: 04/04/15 Potential to Achieve Goals: Good ADL Goals Pt Will Perform Lower Body Bathing: sitting/lateral leans;sit to/from stand;with adaptive equipment;with mod assist Pt Will Perform Lower Body Dressing: with adaptive equipment;sitting/lateral leans;sit to/from stand;with mod assist Pt Will Transfer to Toilet: with mod assist (bariatric steady) Pt Will Perform Tub/Shower Transfer: Shower transfer;with mod assist;3 in 1 (bariatric steady)  OT Frequency: Min 2X/week   Barriers to D/C:            Co-evaluation              End of Session Equipment Utilized During Treatment: Gait belt;Other (comment) (bariatric steady) Nurse Communication: Mobility status;Need for lift equipment;Other (comment) (use bariatric steady)  Activity Tolerance: Patient tolerated treatment well Patient left: in bed;with call bell/phone within reach;with family/visitor present;with SCD's reapplied   Time: 6010-9323 OT Time Calculation (min): 31 min Charges:  OT General Charges $OT Visit: 1 Procedure OT Evaluation $Initial OT Evaluation Tier I: 1 Procedure OT Treatments $Self Care/Home Management : 8-22 mins  G-Codes:    Hortencia Pilar April 14, 2015, 1:55 PM

## 2015-03-28 NOTE — Care Management Note (Signed)
Case Management Note  Patient Details  Name: Heather [name] MRN: [id number] Date of Birth: November 05, 1945  Subjective/Objective:    69 yo F s/p Thoracic Four-Thoracic Six Laminectomy for tumor                Action/Plan: PT is recommending HHPT, supervision/assistance 24 hrs, and no DME   Expected Discharge Date:  03/30/15                Expected Discharge Plan:  Home w Home Health Services  In-House Referral:  Clinical Social Work  Discharge planning Services  CM Consult  Post Acute Care Choice:    Choice offered to:     DME Arranged:    DME Agency:     HH Arranged:    [place] Agency:     Status of Service:  Completed, signed off  Medicare Important Message Given:    Date Medicare IM Given:    Medicare IM give by:    Date Additional Medicare IM Given:    Additional Medicare Important Message give by:     If discussed at [place] of Stay Meetings, dates discussed:    Additional Comments: met with pt to discuss D/C plan and PT recommendations. She plans to return home with the support of her husband 24 hrs. She is currently active with [facility] and they know she is in the hospital. She has a W/C, shower chair, and a 3-in-BSC. She denies any D/C needs.  Norina Buzzard, RN 03/28/2015, 3:05 PM

## 2015-03-28 NOTE — Progress Notes (Signed)
Subjective: Patient reports more feeling in feet  Objective: Vital signs in last 24 hours: Temp:  [97.3 F (36.3 C)-98.2 F (36.8 C)] 98.2 F (36.8 C) (11/19 0545) Pulse Rate:  [62-87] 87 (11/19 0545) Resp:  [10-18] 14 (11/19 0545) BP: (94-156)/(53-83) 97/53 mmHg (11/19 0545) SpO2:  [100 %] 100 % (11/19 0545) Weight:  [59.421 kg (131 lb)] 59.421 kg (131 lb) (11/18 1425)  Intake/Output from previous day: 11/18 0701 - 11/19 0700 In: 1950 [I.V.:1700; IV Piggyback:250] Out: 1950 [Urine:1150; Blood:800] Intake/Output this shift: Total I/O In: 1950 [I.V.:1700; IV Piggyback:250] Out: 1950 [Urine:1150; Blood:800]  Physical Exam: Motor exam shows right greater than left leg weakness.  Patient feels this is similar to preop, but says she has more feeling in her feet.  Lab Results:  Recent Labs  03/28/15 0446  WBC 4.2  HGB 7.5*  HCT 23.7*  PLT 138*   BMET  Recent Labs  03/27/15 1434 03/28/15 0446  NA 135 135  K 4.2 3.8  CL 97* 99*  CO2 31 28  GLUCOSE 112* 159*  BUN 25* 24*  CREATININE 0.79 0.78  CALCIUM 9.4 7.8*    Studies/Results: No results found.  Assessment/Plan: Mobilize with PT.  Plan myelography and simulation Monday.  Doing well from redo decompression of spinal cord.      LOS: 1 day    Peggyann Shoals, MD 03/28/2015, 6:58 AM

## 2015-03-28 NOTE — Progress Notes (Addendum)
Patient is experiencing new onset of generalized weakness unable to use the steady we have on this floor to transfer to chair or bedside cammode at this time. Along with this issue she experiencing urinary urgency and does not want to use a bedpan so I did not remove her foley will let on comming nurse know of these issues and monitor for increased ability to transfer.

## 2015-03-29 MED ORDER — ENOXAPARIN SODIUM 40 MG/0.4ML ~~LOC~~ SOLN: 40.0000 mg | SUBCUTANEOUS | Status: DC

## 2015-03-29 MED ORDER — HEPARIN SODIUM (PORCINE) 5000 UNIT/ML IJ SOLN: 5000.0000 [IU] | Freq: Three times a day (TID) | INTRAMUSCULAR | Status: DC

## 2015-03-29 NOTE — Progress Notes (Signed)
Utilization review completed.  

## 2015-03-29 NOTE — Progress Notes (Signed)
Pt upset with care and wanted to see charge nurse after Dr. Joya Salm came in to see her. Orders were given to DC foley and Hemovac and take shower without dressing. After assessing the situation, removing hemovac and foley it was determined by RN and NT that taking a shower would be difficult with the decreased ambulation abilities of the the patient. While we were in process of trying to make a plan of how the shower was going to be accomplished, the patient got upset assuming that we were not going to come to help her. NT evaluated bathroom for possible barriers and then went to discuss best plan of care with charge nurse and it was determined that wheel-chairing the patient into the bathroom then assisting with pivoting into the shower onto the bedside commode would be best as there is no shower stool in the shower. The step to get in the shower was the most difficult barrier to the plan. The bedside commode was placed on each side of the step of the shower and the patient was physically lifted by staff from the wheelchair onto the bedside commode. During this process, the patient had no use of lower extremities causing staff to physically lift patient's entire body onto the commode. Towels were laid on the floor to prevent water from running into room. Patient showered halfway in shower, halfway out of shower. After shower completed, patient physically lifted from bedside commode to wheelchair, then from wheelchair back to recliner in room. Rosalio Loud, RN

## 2015-03-29 NOTE — Progress Notes (Signed)
Physical Therapy Treatment Patient Details Name: Heather [name] MRN: [id number] DOB: 1946/01/21 Today's Date: 03/29/2015    History of Present Illness 69 y.o. female s/p Thoracic Four-Thoracic Six Laminectomy for tumor. PMH significant for breast CA s/p mastectomy    PT Comments    Pt in good spirits this AM, reporting she is feeling better. Pt states she will be going to [facility] tomorrow, 03-30-15, for a procedure. She anticipates possible d/c home Tuesday, 03-31-15. PT will need education session with her husband prior to d/c for mobility review/training.  Follow Up Recommendations  Home health PT;Supervision/Assistance - 24 hour     Equipment Recommendations  None recommended by PT    Recommendations for Other Services       Precautions / Restrictions Precautions Precautions: Fall;Back Precaution Comments: Reviewed 3/3 back precautions. Pt verbalizes understanding. Restrictions Weight Bearing Restrictions: No    Mobility  Bed Mobility     Rolling: Supervision Sidelying to sit: Min assist       General bed mobility comments: assist to advance BLE, use of bed rail to power up; extensor tone noted LLE during supine to sit  Transfers   Equipment used: None       Squat pivot transfers: +2 physical assistance;Mod assist     General transfer comment: extensor tone noted LLE during transfer  Ambulation/Gait             General Gait Details: nonambulatory   Stairs            Wheelchair Mobility    Modified Rankin (Stroke Patients Only)       Balance                                    Cognition Arousal/Alertness: Awake/alert Behavior During Therapy: WFL for tasks assessed/performed Overall Cognitive Status: Within Functional Limits for tasks assessed                      Exercises      General Comments        Pertinent Vitals/Pain Pain Assessment: 0-10 Pain Score: 2  Pain Location: sx site Pain Descriptors /  Indicators: Sore Pain Intervention(s): Monitored during session;Repositioned    Home Living                      Prior Function            PT Goals (current goals can now be found in the care plan section) Acute Rehab PT Goals Patient Stated Goal: Go home PT Goal Formulation: With patient Time For Goal Achievement: 04/11/15 Potential to Achieve Goals: Good Progress towards PT goals: Progressing toward goals    Frequency  Min 5X/week    PT Plan Current plan remains appropriate    Co-evaluation             End of Session Equipment Utilized During Treatment: Gait belt Activity Tolerance: Patient tolerated treatment well Patient left: in chair;with call bell/phone within reach     Time: 0824-0849 PT Time Calculation (min) (ACUTE ONLY): 25 min  Charges:  $Therapeutic Activity: 23-37 mins                    G Codes:      Lorriane Shire 03/29/2015, 9:58 AM

## 2015-03-29 NOTE — Progress Notes (Signed)
Patient ID: Heather [name], female   DOB: 1945/09/25, 69 y.o.   MRN: [id number] Patient upset about her care. Minimal drain in the hemovac. Dc foley

## 2015-03-30 ENCOUNTER — Inpatient Hospital Stay (HOSPITAL_COMMUNITY): Payer: Medicare Other

## 2015-03-30 ENCOUNTER — Encounter (HOSPITAL_COMMUNITY): Payer: Self-pay | Admitting: Neurosurgery

## 2015-03-30 ENCOUNTER — Ambulatory Visit
Admit: 2015-03-30 | Discharge: 2015-03-30 | Disposition: A | Discharge status: Home / Self Care | Payer: Medicare Other | Attending: Radiation Oncology | Admitting: Radiation Oncology

## 2015-03-30 ENCOUNTER — Ambulatory Visit: Payer: Medicare Other

## 2015-03-30 DIAGNOSIS — C7951 Secondary malignant neoplasm of bone: Secondary | ICD-10-CM

## 2015-03-30 MED ORDER — TRAMADOL HCL 50 MG PO TABS: 50.0000 mg | ORAL_TABLET | Freq: Four times a day (QID) | ORAL | Status: DC | PRN

## 2015-03-30 MED ORDER — HYDROCODONE-ACETAMINOPHEN 5-325 MG PO TABS
ORAL_TABLET | ORAL | Status: AC
  Filled 2015-03-30: qty 1

## 2015-03-30 MED ORDER — DEXAMETHASONE 1 MG PO TABS: 1.0000 mg | ORAL_TABLET | Freq: Two times a day (BID) | ORAL | Status: DC

## 2015-03-30 MED ORDER — APIXABAN 2.5 MG PO TABS: 2.5000 mg | ORAL_TABLET | Freq: Two times a day (BID) | ORAL | Status: DC

## 2015-03-30 MED ORDER — IOHEXOL 300 MG/ML  SOLN
10.0000 mL | Freq: Once | INTRAMUSCULAR | Status: DC | PRN
  Administered 2015-03-30: 10 mL via INTRATHECAL
  Filled 2015-03-30: qty 10

## 2015-03-30 MED ORDER — LIDOCAINE HCL (PF) 1 % IJ SOLN
INTRAMUSCULAR | Status: AC
  Filled 2015-03-30: qty 10

## 2015-03-30 MED ORDER — DEXAMETHASONE 2 MG PO TABS: 2.0000 mg | ORAL_TABLET | Freq: Two times a day (BID) | ORAL | Status: DC

## 2015-03-30 NOTE — Progress Notes (Signed)
Patient returned from [place] and will be transported to [facility] by [facility] for radiation therapy.

## 2015-03-30 NOTE — Care Management Important Message (Signed)
Important Message  Patient Details  Name: Heather [name] MRN: [id number] Date of Birth: [birth year]   Medicare Important Message Given:  Yes    [name] 03/30/2015, 3:33 PM

## 2015-03-30 NOTE — Care Management Note (Signed)
Case Management Note  Patient Details  Name: Heather [name] MRN: [id number] Date of Birth: 01/01/1946  Subjective/Objective:                    Action/Plan: Patient having some additional testing today and then plan is to discharge home with home health services. Patient was active with [facility] prior to admission and would like to continue with them. Mary with [facility] notified of the resumption of care at discharge. Bedside RN updated.   Expected Discharge Date:                  Expected Discharge Plan:  [place]  In-House Referral:  Clinical Social Work  Discharge planning Services  CM Consult  Post Acute Care Choice:  Home Health Choice offered to:  Patient  DME Arranged:    DME Agency:     HH Arranged:  PT HH Agency:  [place]  Status of Service:  Completed, signed off  Medicare Important Message Given:    Date Medicare IM Given:    Medicare IM give by:    Date Additional Medicare IM Given:    Additional Medicare Important Message give by:     If discussed at [place] of Stay Meetings, dates discussed:    Additional Comments:  Pollie Friar, RN 03/30/2015, 9:52 AM

## 2015-03-30 NOTE — Progress Notes (Signed)
Physical Therapy Treatment Patient Details Name: Heather [name] MRN: [id number] DOB: [birth year] Today's Date: 03/30/2015    History of Present Illness 69 y.o. female s/p Thoracic Four-Thoracic Six Laminectomy for tumor. PMH significant for breast CA s/p mastectomy    PT Comments    Session focused on safe transfers, simulating to/from wheelchair with lateral scoot transfer; Able to perform with min assist mostly for stabilizing LEs during transition; I did not have the opportunity to see her husband give assist, but Pt is confident she and her husband will manage fine at home; OK for dc home from PT standpoint   Follow Up Recommendations  Home health PT;Supervision/Assistance - 24 hour     Equipment Recommendations  None recommended by PT    Recommendations for Other Services       Precautions / Restrictions Precautions Precautions: Fall;Back Precaution Comments: Reviewed 3/3 back precautions. Pt verbalizes understanding.    Mobility  Bed Mobility Overal bed mobility: Needs Assistance Bed Mobility: Rolling;Sidelying to Sit;Sit to Sidelying Rolling: Supervision Sidelying to sit: Min assist     Sit to sidelying: Mod assist General bed mobility comments: Pt demonstrated good understanding of log roll, and sidelying to sit; she was able clear her own feet and knees from bed in prep for pushing up; min assist to steady, especially at initial sit; mod assist to help LEs into bed  Transfers Overall transfer level: Needs assistance Equipment used: 1 person hand held assist Transfers: Lateral/Scoot Transfers          Lateral/Scoot Transfers: Min assist General transfer comment: Performed lateral scoot transfers bed to chair to bed to simulate more of a home situation, transferring to wheelchair with armrest removed; needed assist mostly for stabilizing LEs, blocking knees and feet on floor; overall pt repuiring min assist, and she is confident her husband can provide adeqate  asist at home  Ambulation/Gait                 Stairs            Wheelchair Mobility    Modified Rankin (Stroke Patients Only)       Balance Overall balance assessment: Needs assistance Sitting-balance support: Bilateral upper extremity supported Sitting balance-Leahy Scale: Poor                              Cognition Arousal/Alertness: Awake/alert Behavior During Therapy: WFL for tasks assessed/performed Overall Cognitive Status: Within Functional Limits for tasks assessed                      Exercises      General Comments        Pertinent Vitals/Pain Pain Assessment: Faces Faces Pain Scale: Hurts little more Pain Location: Pain in back at surgery site with movement; no pain at rest Pain Descriptors / Indicators: Aching Pain Intervention(s): Monitored during session;Repositioned    Home Living                      Prior Function            PT Goals (current goals can now be found in the care plan section) Acute Rehab PT Goals Patient Stated Goal: Go home PT Goal Formulation: With patient Time For Goal Achievement: 04/11/15 Potential to Achieve Goals: Good Progress towards PT goals: Progressing toward goals    Frequency  Min 5X/week    PT Plan Current plan remains  appropriate    Co-evaluation             End of Session Equipment Utilized During Treatment:  (bed pad) Activity Tolerance: Patient tolerated treatment well Patient left: in bed;with call bell/phone within reach;with nursing/sitter in room     Time: 0850-0913 PT Time Calculation (min) (ACUTE ONLY): 23 min  Charges:  $Therapeutic Activity: 23-37 mins                    G Codes:      Quin Hoop 03/30/2015, 10:37 AM  Roney Marion, [place] Pager [phone] Office [phone]

## 2015-03-30 NOTE — Progress Notes (Signed)
Patient is discharged from room 5C16 at this time. Alert and in stable condition. IV site d/c'd and instructions read to patient with understanding verbalized. Left unit via wheelchair with husband and all belongings at side.

## 2015-03-30 NOTE — Progress Notes (Signed)
No issues overnight. Pt has no specific c/o this am. Requesting to go home after her rad onc sim.  EXAM:  BP 108/59 mmHg  Pulse 75  Temp(Src) 97.7 F (36.5 C) (Oral)  Resp 20  Ht '5\' 4"'$  (1.626 m)  Wt 59.421 kg (131 lb)  BMI 22.47 kg/m2  SpO2 99%  Awake, alert, oriented  Speech fluent, appropriate  CN grossly intact  Stable BLE paraparesis Wound c/d/i  IMPRESSION:  69 y.o. female POD# 3 s/p T4-6 decompression for compression from metastatic tumor. At baseline.  PLAN: - CT myelogram today followed by radiation sim at [facility] - D/C when she returns from [facility] - will plan on steroid taper upon d/c - Can restart Eliquis on Fri (7d after surgery)

## 2015-03-30 NOTE — Progress Notes (Signed)
Patient is transported to [facility] oncology at this time by [facility] via stretcher. Alert and in stable condition.

## 2015-03-30 NOTE — Discharge Summary (Signed)
Physician Discharge Summary  Patient ID: Heather [name] MRN: [id number] DOB/AGE: 05-21-1945 69 y.o.  Admit date: 03/27/2015 Discharge date: 03/30/2015  Admission Diagnoses:  Metastatic spine tumor  Discharge Diagnoses: Same Active Problems:   Metastatic cancer to spine [facility])   Discharged Condition: Stable  Hospital Course:  [name] is a 69 y.o. female electively admitted after decompression of T4-6 for spinal cord compression. She had an uneventful hospital course, with unchanged BLE paraparesis. She underwent simulation at [facility] [place] after CT Myelogram and requested discharge after than.  Treatments: Surgery - T4-6 decompression  Discharge Exam: Blood pressure 108/59, pulse 75, temperature 97.7 F (36.5 C), temperature source Oral, resp. rate 20, height '5\' 4"'$  (1.626 m), weight 59.421 kg (131 lb), SpO2 99 %. Awake, alert, oriented Speech fluent, appropriate CN grossly intact ~3/5 BLE Wound c/d/i  Disposition: 06-Home-Health Care Svc      Discharge Instructions    Ambulatory referral to [place]    Complete by:  As directed   Please evaluate Heather [name] for admission to [facility].  Disciplines requested: Physical Therapy  Services to provide: Strengthening Exercises and Evaluate  Physician to follow patient's care (the person listed here will be responsible for signing ongoing orders): Referring Provider  Requested Start of Care Date: Tomorrow  I certify that this patient is under my care and that I, or a Nurse Practitioner or Physician's Assistant working with me, had a face-to-face encounter that meets the physician face-to-face requirements with patient on 03/30/15. The encounter with the patient was in whole, or in part for the following medical condition(s) which is the primary reason for home health care (List medical condition). Metastatic spine tumor with spinal cord compression, Myelopathy  Special Instructions:  Resume home PT as prior to surgery   Does the patient have Medicare or Medicaid?:  Yes  The encounter with the patient was in whole, or in part, for the following medical condition, which is the primary reason for home health care:  metastatic spine tumor  Reason for Medically Necessary Home Health Services:  Therapy- Therapeutic Exercises to Increase Strength and Endurance  My clinical findings support the need for the above services:  Can transfer bed to chair only  I certify that, based on my findings, the following services are medically necessary home health services:  Physical therapy  Further, I certify that my clinical findings support that this patient is homebound due to:  Unable to leave home safely without assistance     Discharge patient    Complete by:  As directed             Medication List    STOP taking these medications        anastrozole 1 MG tablet  Commonly known as:  ARIMIDEX      TAKE these medications        apixaban 2.5 MG Tabs tablet  Commonly known as:  ELIQUIS  Take 1 tablet (2.5 mg total) by mouth 2 (two) times daily.  Start taking on:  04/03/2015     b complex vitamins tablet  Take 1 tablet by mouth daily.     CALCIUM-D PO  Take 1 tablet by mouth daily.     CoQ10 100 MG Caps  Take 100 mg by mouth at bedtime.     dexamethasone 2 MG tablet  Commonly known as:  DECADRON  Take 1 tablet (2 mg total) by mouth 2 (two) times daily. '2mg'$  PO BID x 5  days then '1mg'$  PO BID x 5 days, then off.     dexamethasone 1 MG tablet  Commonly known as:  DECADRON  Take 1 tablet (1 mg total) by mouth 2 (two) times daily with a meal. '2mg'$  PO BID x 5 days then '1mg'$  PO BID x 5 days, then off.  Start taking on:  04/04/2015     gabapentin 400 MG capsule  Commonly known as:  NEURONTIN  Take 1 capsule (400 mg total) by mouth 3 (three) times daily.     lidocaine-prilocaine cream  Commonly known as:  EMLA  Apply to affected area once     methocarbamol 500 MG tablet  Commonly known as:  ROBAXIN  Take 1  tablet (500 mg total) by mouth daily as needed for muscle spasms.     OVER THE COUNTER MEDICATION  Place 1 drop into both eyes 2 (two) times daily as needed (dry eyes). Over the counter lubricating eye drops     polyethylene glycol packet  Commonly known as:  MIRALAX / GLYCOLAX  Take 17 g by mouth 2 (two) times daily.     pravastatin 10 MG tablet  Commonly known as:  PRAVACHOL  Take 10 mg by mouth at bedtime.     PROBIOTIC DAILY PO  Take 1 capsule by mouth 2 (two) times daily.     ranitidine 300 MG tablet  Commonly known as:  ZANTAC  Take 300 mg by mouth at bedtime.     spironolactone 25 MG tablet  Commonly known as:  ALDACTONE  Take 0.5 tablets (12.5 mg total) by mouth daily.     traMADol 50 MG tablet  Commonly known as:  ULTRAM  Take 1 tablet (50 mg total) by mouth every 6 (six) hours as needed for moderate pain.     TUMS PO  Take 2 tablets by mouth 2 (two) times daily as needed (acid reflux).     Vitamin D 2000 UNITS tablet  Take 2,000 Units by mouth daily.       Follow-up Information    Follow up with [facility], Arkel Cartwright, C, MD In 2 weeks.   Specialty:  Neurosurgery   Contact information:   [address] [place] [address] [place] [address] [phone]       Signed: Consuella Lose, Loletha Grayer 03/30/2015, 9:06 AM

## 2015-03-30 NOTE — Progress Notes (Signed)
OT Cancellation Note  Patient Details Name: Heather [name] MRN: [id number] DOB: Oct 22, 1945   Cancelled Treatment:    Reason Eval/Treat Not Completed: Patient at procedure or test/ unavailable.  Pt. Leaving for [facility] for procedure.  Will check back as able.   Janice Coffin, COTA/L 03/30/2015, 9:37 AM

## 2015-03-31 ENCOUNTER — Ambulatory Visit: Payer: Medicare Other

## 2015-03-31 DIAGNOSIS — C720 Malignant neoplasm of spinal cord: Secondary | ICD-10-CM | POA: Diagnosis not present

## 2015-04-01 ENCOUNTER — Ambulatory Visit: Payer: Medicare Other

## 2015-04-01 ENCOUNTER — Telehealth: Payer: Self-pay | Admitting: Hematology and Oncology

## 2015-04-01 NOTE — Telephone Encounter (Signed)
Spoke with patient due to call from Elizabeth with rad onc as patient is having her port placed on 12/1,ca;;ed patient and she would like to move appointments to 12/2 which has been done and i have added flush appointments to other treatments,she will get a new schedule 12/2

## 2015-04-04 NOTE — Progress Notes (Signed)
  Radiation Oncology         [phone] ________________________________  Name: Heather [name]  MRN: [id number]  Date: 03/30/2015  DOB: 1945-12-06  SIMULATION AND TREATMENT PLANNING NOTE    ICD-9-CM ICD-10-CM   1. Metastatic cancer to spine ([place]) 198.5 C79.51     DIAGNOSIS:  69 yo woman with an isolated solitary spine metastasis from metastatic breast cancer  NARRATIVE:  The patient was brought to the CT Simulation planning suite following further surgical decompression and myelogram.  Identity was confirmed.  All relevant records and images related to the planned course of therapy were reviewed.  The patient previously provided informed written consent to proceed with treatment after reviewing the details related to the planned course of therapy. Then, the patient was set-up in a stable reproducible  supine position for radiation therapy back in her previously constructed BodyFix device.  New CT images were obtained.  Surface markings were placed.  The CT images were loaded into the planning software.  Then the target and avoidance structures were contoured.  Treatment planning then occurred.  The radiation prescription was entered and confirmed.  Then, I designed and supervised the construction of a total of no medically necessary complex treatment devices.  I have requested : Intensity Modulated Radiotherapy (IMRT) is medically necessary for this case for the following reason:  The tumor circumferentially encases the spinal cord and it is on the only site of metastatic disease warranting a high dose for durable control.  Achieving high dose to target while sparing cord will necessitate IMRT.    PLAN:  The patient will receive 45 Gy in 25 fractions to the tumor bed plus 2 cm.  Dose painting will be used to boost the gross disease to 55 Gy in 25 fractions of 2.2 Gy.  ________________________________  Sheral Apley. [name], M.D.

## 2015-04-06 ENCOUNTER — Ambulatory Visit: Payer: Medicare Other

## 2015-04-06 ENCOUNTER — Inpatient Hospital Stay: Payer: Medicare Other | Admitting: Physical Medicine & Rehabilitation

## 2015-04-06 DIAGNOSIS — I129 Hypertensive chronic kidney disease with stage 1 through stage 4 chronic kidney disease, or unspecified chronic kidney disease: Secondary | ICD-10-CM | POA: Diagnosis not present

## 2015-04-06 DIAGNOSIS — C7951 Secondary malignant neoplasm of bone: Secondary | ICD-10-CM | POA: Diagnosis not present

## 2015-04-06 DIAGNOSIS — C50411 Malignant neoplasm of upper-outer quadrant of right female breast: Secondary | ICD-10-CM | POA: Diagnosis not present

## 2015-04-06 DIAGNOSIS — M8458XD Pathological fracture in neoplastic disease, other specified site, subsequent encounter for fracture with routine healing: Secondary | ICD-10-CM | POA: Diagnosis not present

## 2015-04-06 DIAGNOSIS — G8222 Paraplegia, incomplete: Secondary | ICD-10-CM | POA: Diagnosis not present

## 2015-04-06 DIAGNOSIS — N390 Urinary tract infection, site not specified: Secondary | ICD-10-CM | POA: Diagnosis not present

## 2015-04-07 ENCOUNTER — Encounter: Payer: Self-pay | Admitting: Physical Medicine & Rehabilitation

## 2015-04-07 ENCOUNTER — Encounter (HOSPITAL_COMMUNITY): Payer: Self-pay | Admitting: *Deleted

## 2015-04-07 ENCOUNTER — Inpatient Hospital Stay (HOSPITAL_COMMUNITY)
Admission: RE | Admit: 2015-04-07 | Discharge: 2015-04-07 | Disposition: A | Discharge status: Home / Self Care | Payer: Medicare Other | Source: Ambulatory Visit

## 2015-04-07 ENCOUNTER — Telehealth: Payer: Self-pay | Admitting: *Deleted

## 2015-04-07 ENCOUNTER — Ambulatory Visit: Payer: Medicare Other

## 2015-04-07 ENCOUNTER — Encounter: Payer: Medicare Other | Attending: Physical Medicine & Rehabilitation | Admitting: Physical Medicine & Rehabilitation

## 2015-04-07 ENCOUNTER — Other Ambulatory Visit: Payer: Self-pay | Admitting: General Surgery

## 2015-04-07 VITALS — BP 135/80 | HR 91 | Resp 14

## 2015-04-07 DIAGNOSIS — R202 Paresthesia of skin: Secondary | ICD-10-CM | POA: Diagnosis not present

## 2015-04-07 DIAGNOSIS — K219 Gastro-esophageal reflux disease without esophagitis: Secondary | ICD-10-CM | POA: Insufficient documentation

## 2015-04-07 DIAGNOSIS — C7951 Secondary malignant neoplasm of bone: Secondary | ICD-10-CM | POA: Diagnosis not present

## 2015-04-07 DIAGNOSIS — M25511 Pain in right shoulder: Secondary | ICD-10-CM | POA: Insufficient documentation

## 2015-04-07 DIAGNOSIS — D62 Acute posthemorrhagic anemia: Secondary | ICD-10-CM | POA: Insufficient documentation

## 2015-04-07 DIAGNOSIS — G8220 Paraplegia, unspecified: Secondary | ICD-10-CM

## 2015-04-07 DIAGNOSIS — G9529 Other cord compression: Secondary | ICD-10-CM

## 2015-04-07 DIAGNOSIS — N319 Neuromuscular dysfunction of bladder, unspecified: Secondary | ICD-10-CM | POA: Diagnosis not present

## 2015-04-07 DIAGNOSIS — Z85828 Personal history of other malignant neoplasm of skin: Secondary | ICD-10-CM | POA: Diagnosis not present

## 2015-04-07 DIAGNOSIS — I129 Hypertensive chronic kidney disease with stage 1 through stage 4 chronic kidney disease, or unspecified chronic kidney disease: Secondary | ICD-10-CM | POA: Diagnosis not present

## 2015-04-07 DIAGNOSIS — G839 Paralytic syndrome, unspecified: Secondary | ICD-10-CM | POA: Diagnosis not present

## 2015-04-07 DIAGNOSIS — M8448XS Pathological fracture, other site, sequela: Secondary | ICD-10-CM | POA: Diagnosis not present

## 2015-04-07 DIAGNOSIS — R42 Dizziness and giddiness: Secondary | ICD-10-CM | POA: Diagnosis not present

## 2015-04-07 DIAGNOSIS — Z452 Encounter for adjustment and management of vascular access device: Secondary | ICD-10-CM | POA: Insufficient documentation

## 2015-04-07 DIAGNOSIS — G9520 Unspecified cord compression: Secondary | ICD-10-CM

## 2015-04-07 DIAGNOSIS — F419 Anxiety disorder, unspecified: Secondary | ICD-10-CM | POA: Diagnosis not present

## 2015-04-07 DIAGNOSIS — R0789 Other chest pain: Secondary | ICD-10-CM | POA: Insufficient documentation

## 2015-04-07 DIAGNOSIS — E785 Hyperlipidemia, unspecified: Secondary | ICD-10-CM | POA: Diagnosis not present

## 2015-04-07 DIAGNOSIS — Z853 Personal history of malignant neoplasm of breast: Secondary | ICD-10-CM | POA: Insufficient documentation

## 2015-04-07 DIAGNOSIS — N189 Chronic kidney disease, unspecified: Secondary | ICD-10-CM | POA: Diagnosis not present

## 2015-04-07 NOTE — Progress Notes (Signed)
Subjective:    Patient ID: Heather [name], female    DOB: [birth year], 69 y.o.   MRN: [id number]  HPI Heather [name] is here in follow up of her thoracic cord injury/metastatic breast cancer. She was with [place] on inpatient rehab during October to early November. She was discharged home with surgical/rad-onc/onc follow up.  After an interdisciplinary discussion the onc/surgery team decided it was best to proceed with follow up surgery to decompress her spinal cord given the ongoing presence of the tumor. The surgical decompression was performed 11/13, and the patient left the hospital on 11/21. She just started home PT yesterday. She has a porta-cath scheduled for this week and ctx as well. She will also receive a course of spinal XRT.  She is using a w/c for her primary means of mobility. Her legs have gotten weaker and she is quite limited with use of them. Her pain levels have been fairly consistent/improved. She is using 1-2 tramadol per day. The gabapentin is still effective for her chest wall pain, leg pain.   She and her husband use a time voiding schedule to maximize continent bladder emptying. She typically knows when she has to empty her bowels and most empties in the morning.   Pain Inventory Average Pain 4 Pain Right Now NA My pain is aching  In the last 24 hours, has pain interfered with the following? General activity 3 Relation with others 0 Enjoyment of life 0 What TIME of day is your pain at its worst? morning Sleep (in general) Good  Pain is worse with: some activites Pain improves with: medication Relief from Meds: 5  Mobility how many minutes can you walk? NA ability to climb steps?  no do you drive?  no use a wheelchair needs help with transfers Do you have any goals in this area?  yes  Function disabled: date disabled 02/2015 I need assistance with the following:  dressing, toileting, meal prep, household duties and shopping Do you have any goals in this area?   yes  Neuro/Psych bladder control problems weakness tingling spasms dizziness  Prior Studies Any changes since last visit?  no  Physicians involved in your care Any changes since last visit?  no   Family History  Problem Relation Age of Onset  . Leukemia Mother   . Colon cancer Brother   . Cancer Brother     Prostate  . Colon cancer Maternal Grandmother   . Stroke Father   . Diabetes Mellitus II Father    Social History   Social History  . Marital Status: Married    Spouse Name: N/A  . Number of Children: 2  . Years of Education: N/A   Social History Main Topics  . Smoking status: Never Smoker   . Smokeless tobacco: Never Used  . Alcohol Use: No  . Drug Use: No  . Sexual Activity: Yes    Birth Control/ Protection: Post-menopausal   Other Topics Concern  . None   Social History Narrative   Past Surgical History  Procedure Laterality Date  . Cyst removed from left breast  1970  . Left wrist surgery   2004    with plate  . Colonoscopy    . Esophagogastroduodenoscopy    . Mastectomy, radical    . Mastectomy modified radical  05/10/2012    Procedure: MASTECTOMY MODIFIED RADICAL;  Surgeon: Merrie Roof, MD;  Location: [facility];  Service: General;  Laterality: Right;  RIGHT MODIFIED RADICAL MASTECTOMY  .  Portacath placement  05/10/2012    Procedure: INSERTION PORT-A-CATH;  Surgeon: Merrie Roof, MD;  Location: [facility];  Service: General;  Laterality: Left;  . Decompressive lumbar laminectomy level 4 N/A 02/15/2015    Procedure: DECOMPRESSION T5 AND T3-T7 STABALIZATION;  Surgeon: Consuella Lose, MD;  Location: [facility];  Service: Neurosurgery;  Laterality: N/A;  . Laminectomy N/A 03/27/2015    Procedure: Thoracic Four-Thoracic Six Laminectomy for tumor;  Surgeon: Consuella Lose, MD;  Location: [facility];  Service: Neurosurgery;  Laterality: N/A;  T4-T6 Laminectomy   Past Medical History  Diagnosis Date  . Chronic kidney disease   . Hx: UTI (urinary  tract infection)   . Right shoulder pain   . Right knee pain   . Hypertension     recently started Aldactone   . Hyperlipidemia     but not on meds;diet and exercise controlled  . Dizziness     has been going on for 31month;medical MD aware  . H/O hiatal hernia   . GERD (gastroesophageal reflux disease)     Tums prn  . Hemorrhoids   . Urinary frequency     d/t taking Aldactone  . History of UTI   . Anemia     hx of  . Cataract     immature-not sure of which eye  . Anxiety     r/t updated surgery  . Insomnia     takes Melatoniin daily  . Breast cancer ([place]     right  . Skin cancer   . Hx of radiation therapy 10/25/12- 12/11/12    right chest wall/regional lymph nodes 5040 cGy, 28 sessions, right chest wall boost 1000 cGy 1 session  . H. pylori infection   . Metastasis to spinal column (HCC)     T5  . Complication of anesthesia     pt states she is sensitive to meds  . PONV (postoperative nausea and vomiting)     pt experienced hair loss, confusion and combative   BP 135/80 mmHg  Pulse 91  Resp 14  SpO2 100%  Opioid Risk Score:   Fall Risk Score:  `1  Depression screen PHQ 2/9  Depression screen PHQ 2/9 04/07/2015  Decreased Interest 1  Down, Depressed, Hopeless 1  PHQ - 2 Score 2  Altered sleeping 0  Tired, decreased energy 1  Change in appetite 0  Feeling bad or failure about yourself  1  Trouble concentrating 1  Moving slowly or fidgety/restless 0  Suicidal thoughts 0  PHQ-9 Score 5  Difficult doing work/chores Not difficult at all     Review of Systems  Cardiovascular: Positive for leg swelling.  Genitourinary:       Bladder Control Problems  Musculoskeletal:       Spasms  Neurological: Positive for dizziness and weakness.       Tingling  All other systems reviewed and are negative.      Objective:   Physical Exam Constitutional: She appears well-developed and well-nourished. No distress.  HENT: dentition intact.  Head: Normocephalic  and atraumatic.  Mouth/Throat: Oropharynx is clear and moist. No obvious lesions Eyes: Conjunctivae and EOM are normal. Conj WNL Neck: Normal range of motion. Neck supple.  Cardiovascular: Normal rate and regular rhythm.  No murmur heard. Respiratory: Effort normal and breath sounds normal.  GI: Soft. Bowel sounds are normal. She exhibits no distension. There is no tenderness.  Musculoskeletal: She exhibits no tenderness. Trace edema in feet Neurological: She is alert  and oriented.  Strength UE grossly 4+/5 with inhibition proximally due to pain. LLE-- hip flexion 2-/5, ankle dorsiflexion 2-,2/5, plantar flexion 4+/5, RLE--- 2-/5HF, 2KE, 2/5 ADF/APF Sensation remains decreased below T5-6,  No resting tone. DTR's 3+ bilateral LE's. Skin: Skin is warm and dry. No rash noted. No erythema. Surgical incision closed/dry Psych: anxiety       Assessment & Plan:  Medical Problem List and Plan: 1. Functional deficits secondary to T5 cord compression/myelopathy from pathologic compression fracture, metastatic breast cancer. Repeat decompression 03/22/15 -made referral to [facility] for OT and RN in addition to PT -continue HEP also 2. DVT Prophylaxis/Anticoagulation: Pharmaceutical: eliquis  -resume after porta-cath placement, would continue for the next several weeks given VTE risk. Needs to consult with oncologist as well before resuming 3. Pain Management: . Continue tramadol prn, robaxin prn.  -maintain gabapentin at '400mg'$  TID   4. ABLA/Onc: post op -labwork, plan per onc/rad-onc teams  5. Neurogenic bowel and bladder: emptying bowels. -continue a timed schedule with bladder emptying and a qam bowel program if possible to minimize incontinence  Thirty minutes of face to face patient care time were spent during this visit. All questions were encouraged and answered. Follow up with me in about 6  weeks

## 2015-04-07 NOTE — Telephone Encounter (Signed)
[name], PT with [facility]

## 2015-04-07 NOTE — Progress Notes (Signed)
[name] from Dr Ethlyn Gallery office called and said that Dr Marlou Starks is ok with patient being a patient that I call pre- op.  Dr Marlou Starks is also aware that he needs to put orders in the computer.Marland Kitchen

## 2015-04-07 NOTE — Progress Notes (Signed)
Noted Hemoglobin of 7.5 on 03/28/15.  I called and spoke with [name], Dr Ethlyn Gallery Ocie Doyne and informed her of this lab and also asked if patient could be a patient that I call , instead of having hjer come in for a PAT appointment.  Patient was discharged from the hospital on 03/30/15.

## 2015-04-07 NOTE — Patient Instructions (Addendum)
PLEASE CALL ME WITH ANY PROBLEMS OR QUESTIONS (#[phone]). HAVE A HAPPY HOLIDAY SEASON!!!   I WOULD RESUME ELIQUIS THIS WEEK AFTER PORTA-CATH IS PLACED IF OK WITH ONCOLOGY

## 2015-04-07 NOTE — Telephone Encounter (Signed)
[name], PT with [facility] called asking for verbal orders to restart  home health PT following surgery to remove more of her tumor.  She is asking 2-3 visits a week for the next 6 weeks.  Verbal order given

## 2015-04-08 ENCOUNTER — Other Ambulatory Visit (HOSPITAL_COMMUNITY): Payer: Self-pay | Admitting: General Surgery

## 2015-04-08 ENCOUNTER — Other Ambulatory Visit: Payer: Self-pay | Admitting: General Surgery

## 2015-04-08 ENCOUNTER — Encounter (HOSPITAL_COMMUNITY): Payer: Self-pay | Admitting: Vascular Surgery

## 2015-04-08 ENCOUNTER — Ambulatory Visit: Payer: Medicare Other

## 2015-04-08 ENCOUNTER — Telehealth: Payer: Self-pay | Admitting: *Deleted

## 2015-04-08 DIAGNOSIS — Z7901 Long term (current) use of anticoagulants: Secondary | ICD-10-CM | POA: Diagnosis not present

## 2015-04-08 DIAGNOSIS — C7951 Secondary malignant neoplasm of bone: Secondary | ICD-10-CM | POA: Diagnosis not present

## 2015-04-08 DIAGNOSIS — C50911 Malignant neoplasm of unspecified site of right female breast: Secondary | ICD-10-CM | POA: Diagnosis not present

## 2015-04-08 DIAGNOSIS — Z9012 Acquired absence of left breast and nipple: Secondary | ICD-10-CM | POA: Diagnosis not present

## 2015-04-08 DIAGNOSIS — Z170 Estrogen receptor positive status [ER+]: Secondary | ICD-10-CM | POA: Diagnosis not present

## 2015-04-08 DIAGNOSIS — M8458XD Pathological fracture in neoplastic disease, other specified site, subsequent encounter for fracture with routine healing: Secondary | ICD-10-CM | POA: Diagnosis not present

## 2015-04-08 DIAGNOSIS — G8220 Paraplegia, unspecified: Secondary | ICD-10-CM | POA: Diagnosis not present

## 2015-04-08 MED ORDER — CHLORHEXIDINE GLUCONATE 4 % EX LIQD: 1.0000 "application " | Freq: Once | CUTANEOUS | Status: DC

## 2015-04-08 MED ORDER — VANCOMYCIN HCL IN DEXTROSE 1-5 GM/200ML-% IV SOLN: 1000.0000 mg | INTRAVENOUS | Status: AC

## 2015-04-08 NOTE — Progress Notes (Signed)
Anesthesia Chart Review: SAME DAY WORK-UP. She my note from 03/26/15. Since that time, patient underwent T4-6 laminectomy for decompression of spinal cord tumor on 03/27/15. (Has metastatic breast cancer to spine.) By notes, she had an uneventful hospital course, with unchanged BLE paraparesis. She did have a post-op H/H of 7.5/23.7 (and did not receive a transfusion). She is now scheduled for Port-a-cath insertion on 04/09/15 by Dr. Marlou Starks.  Meds include Eliquis (on hold; from what I can tell by records is that she is on Eliquis for DVT prophylaxis), Decadron X 5 days (started 04/04/15), Robaxin, pravastatin, Zantac, spironolactone, tramadol.  She is scheduled for CBC, CMET, PT/INR on arrival. I'll also add a hold clot order just in case she is more anemic on arrival. Would defer additional order for T&S or transfusion to the surgeon and/or anesthesiologist.   Case is posted for [place]. She reported she is sensitive to anesthesia and told our PAT RN that she has concerns about GA being used. She can discuss with her anesthesia team tomorrow and the definitve anesthesia plan determined at that time.   George Hugh [facility]/Anesthesiology Phone [phone] 04/08/2015 9:26 AM

## 2015-04-08 NOTE — Telephone Encounter (Signed)
Infusion canceled until port a cath placement with radiology per MD Gudena. Informed patient to call us when appointment is made. Patient verbalized understanding.

## 2015-04-09 ENCOUNTER — Ambulatory Visit (HOSPITAL_COMMUNITY): Payer: Medicare Other

## 2015-04-09 ENCOUNTER — Other Ambulatory Visit: Payer: Medicare Other

## 2015-04-09 ENCOUNTER — Other Ambulatory Visit: Payer: Self-pay

## 2015-04-09 ENCOUNTER — Encounter (HOSPITAL_COMMUNITY)
Admission: RE | Disposition: A | Discharge status: Home / Self Care | Payer: Self-pay | Source: Ambulatory Visit | Attending: General Surgery

## 2015-04-09 ENCOUNTER — Ambulatory Visit (HOSPITAL_COMMUNITY)
Admission: RE | Admit: 2015-04-09 | Discharge: 2015-04-09 | Disposition: A | Discharge status: Home / Self Care | Payer: Medicare Other | Source: Ambulatory Visit | Attending: General Surgery | Admitting: General Surgery

## 2015-04-09 ENCOUNTER — Encounter (HOSPITAL_COMMUNITY): Payer: Self-pay

## 2015-04-09 ENCOUNTER — Ambulatory Visit: Payer: Medicare Other

## 2015-04-09 ENCOUNTER — Other Ambulatory Visit (HOSPITAL_COMMUNITY): Payer: Self-pay | Admitting: General Surgery

## 2015-04-09 ENCOUNTER — Ambulatory Visit: Payer: Medicare Other | Admitting: Hematology and Oncology

## 2015-04-09 DIAGNOSIS — Z7901 Long term (current) use of anticoagulants: Secondary | ICD-10-CM | POA: Insufficient documentation

## 2015-04-09 DIAGNOSIS — C7981 Secondary malignant neoplasm of breast: Secondary | ICD-10-CM | POA: Diagnosis not present

## 2015-04-09 DIAGNOSIS — Z853 Personal history of malignant neoplasm of breast: Secondary | ICD-10-CM | POA: Diagnosis not present

## 2015-04-09 DIAGNOSIS — Z923 Personal history of irradiation: Secondary | ICD-10-CM | POA: Diagnosis not present

## 2015-04-09 DIAGNOSIS — C50911 Malignant neoplasm of unspecified site of right female breast: Secondary | ICD-10-CM

## 2015-04-09 DIAGNOSIS — Z79899 Other long term (current) drug therapy: Secondary | ICD-10-CM

## 2015-04-09 DIAGNOSIS — C7951 Secondary malignant neoplasm of bone: Secondary | ICD-10-CM | POA: Diagnosis not present

## 2015-04-09 DIAGNOSIS — Z5181 Encounter for therapeutic drug level monitoring: Secondary | ICD-10-CM

## 2015-04-09 DIAGNOSIS — I10 Essential (primary) hypertension: Secondary | ICD-10-CM | POA: Insufficient documentation

## 2015-04-09 DIAGNOSIS — C50411 Malignant neoplasm of upper-outer quadrant of right female breast: Secondary | ICD-10-CM

## 2015-04-09 DIAGNOSIS — Z85828 Personal history of other malignant neoplasm of skin: Secondary | ICD-10-CM | POA: Insufficient documentation

## 2015-04-09 DIAGNOSIS — Z5111 Encounter for antineoplastic chemotherapy: Secondary | ICD-10-CM | POA: Diagnosis not present

## 2015-04-09 DIAGNOSIS — E785 Hyperlipidemia, unspecified: Secondary | ICD-10-CM | POA: Insufficient documentation

## 2015-04-09 LAB — CBC
HCT: 33 % — ABNORMAL LOW (ref 36.0–46.0)
Hemoglobin: 10.1 g/dL — ABNORMAL LOW (ref 12.0–15.0)
MCH: 29.7 pg (ref 26.0–34.0)
MCHC: 30.6 g/dL (ref 30.0–36.0)
MCV: 97.1 fL (ref 78.0–100.0)
Platelets: 243 10*3/uL (ref 150–400)
RBC: 3.4 MIL/uL — ABNORMAL LOW (ref 3.87–5.11)
RDW: 19.5 % — ABNORMAL HIGH (ref 11.5–15.5)
WBC: 6.4 10*3/uL (ref 4.0–10.5)

## 2015-04-09 SURGERY — INSERTION, TUNNELED CENTRAL VENOUS DEVICE, WITH PORT
Anesthesia: General

## 2015-04-09 MED ORDER — MIDAZOLAM HCL 2 MG/2ML IJ SOLN
INTRAMUSCULAR | Status: AC
  Filled 2015-04-09: qty 4

## 2015-04-09 MED ORDER — CEFAZOLIN SODIUM-DEXTROSE 2-3 GM-% IV SOLR
INTRAVENOUS | Status: AC
  Administered 2015-04-09: 2 g via INTRAVENOUS
  Filled 2015-04-09: qty 50

## 2015-04-09 MED ORDER — LIDOCAINE-EPINEPHRINE (PF) 1 %-1:200000 IJ SOLN
INTRAMUSCULAR | Status: AC
  Filled 2015-04-09: qty 30

## 2015-04-09 MED ORDER — ONDANSETRON HCL 4 MG/2ML IJ SOLN
4.0000 mg | Freq: Once | INTRAMUSCULAR | Status: AC
  Administered 2015-04-09: 4 mg via INTRAVENOUS

## 2015-04-09 MED ORDER — CEFAZOLIN SODIUM-DEXTROSE 2-3 GM-% IV SOLR
2.0000 g | Freq: Once | INTRAVENOUS | Status: AC
Start: 2015-04-09 — End: 2015-04-09
  Administered 2015-04-09: 2 g via INTRAVENOUS

## 2015-04-09 MED ORDER — HEPARIN SOD (PORK) LOCK FLUSH 100 UNIT/ML IV SOLN
INTRAVENOUS | Status: AC
  Filled 2015-04-09: qty 5

## 2015-04-09 MED ORDER — FENTANYL CITRATE (PF) 100 MCG/2ML IJ SOLN
INTRAMUSCULAR | Status: AC | PRN
  Administered 2015-04-09 (×2): 50 ug via INTRAVENOUS

## 2015-04-09 MED ORDER — MIDAZOLAM HCL 2 MG/2ML IJ SOLN
INTRAMUSCULAR | Status: AC | PRN
  Administered 2015-04-09 (×2): 1 mg via INTRAVENOUS

## 2015-04-09 MED ORDER — ONDANSETRON HCL 4 MG/2ML IJ SOLN
INTRAMUSCULAR | Status: AC
  Administered 2015-04-09: 4 mg via INTRAVENOUS
  Filled 2015-04-09: qty 2

## 2015-04-09 MED ORDER — FENTANYL CITRATE (PF) 100 MCG/2ML IJ SOLN
INTRAMUSCULAR | Status: AC
  Filled 2015-04-09: qty 4

## 2015-04-09 MED ORDER — LIDOCAINE HCL 1 % IJ SOLN
INTRAMUSCULAR | Status: AC
  Filled 2015-04-09: qty 20

## 2015-04-09 NOTE — Discharge Instructions (Signed)
Incision Care °An incision is when a surgeon cuts into your body. After surgery, the incision needs to be cared for properly to prevent infection.  °HOW TO CARE FOR YOUR INCISION °· Take medicines only as directed by your health care provider. °· There are many different ways to close and cover an incision, including stitches, skin glue, and adhesive strips. Follow your health care provider's instructions on: °¨ Incision care. °¨ Bandage (dressing) changes and removal. °¨ Incision closure removal. °· Do not take baths, swim, or use a hot tub until your health care provider approves. You may shower as directed by your health care provider. °· Resume your normal diet and activities as directed. °· Use anti-itch medicine (such as an antihistamine) as directed by your health care provider. The incision may itch while it is healing. Do not pick or scratch at the incision. °· Drink enough fluid to keep your urine clear or pale yellow. °SEEK MEDICAL CARE IF:  °· You have drainage, redness, swelling, or pain at your incision site. °· You have muscle aches, chills, or a general ill feeling. °· You notice a bad smell coming from the incision or dressing. °· Your incision edges separate after the sutures, staples, or skin adhesive strips have been removed. °· You have persistent nausea or vomiting. °· You have a fever. °· You are dizzy. °SEEK IMMEDIATE MEDICAL CARE IF:  °· You have a rash. °· You faint. °· You have difficulty breathing. °MAKE SURE YOU:  °· Understand these instructions. °· Will watch your condition. °· Will get help right away if you are not doing well or get worse. °  °This information is not intended to replace advice given to you by your health care provider. Make sure you discuss any questions you have with your health care provider. °  °Document Released: 11/12/2004 Document Revised: 05/16/2014 Document Reviewed: 06/19/2013 °Elsevier Interactive Patient Education ©2016 [employer] Inc[employer] ° °

## 2015-04-09 NOTE — H&P (Signed)
Chief Complaint: Patient was seen in consultation today for portacath placement at the request of Toth,Paul III  Referring Physician(s): Toth,Paul III  History of Present Illness: Heather [name] is a 69 y.o. female with metastatic breast cancer. She previously had port placed and removed a few years ago. She now has recurrent metastatic disease. She recently underwent laminectomy and decompression due to spinal mets. She is start chemotherapy again and needs a new port. She has been off her Eliquis for the past few weeks. She has been NPO today No acute c/o fever, shills, illness  Past Medical History  Diagnosis Date  . Chronic kidney disease   . Hx: UTI (urinary tract infection)   . Right shoulder pain   . Right knee pain   . Hypertension     recently started Aldactone   . Hyperlipidemia     but not on meds;diet and exercise controlled  . Dizziness     has been going on for 84month;medical MD aware  . H/O hiatal hernia   . GERD (gastroesophageal reflux disease)     Tums prn  . Hemorrhoids   . Urinary frequency     d/t taking Aldactone  . History of UTI   . Anemia     hx of  . Cataract     immature-not sure of which eye  . Anxiety     r/t updated surgery  . Insomnia     takes Melatoniin daily  . [place]     right  . Skin cancer   . Hx of radiation therapy 10/25/12- 12/11/12    right chest wall/regional lymph nodes 5040 cGy, 28 sessions, right chest wall boost 1000 cGy 1 session  . H. pylori infection   . Metastasis to spinal column (HCC)     T5  . Complication of anesthesia     pt states she is sensitive to meds  . PONV (postoperative nausea and vomiting)     pt experienced hair loss, confusion and combative- 02/15/15    Past Surgical History  Procedure Laterality Date  . Cyst removed from left breast  1970  . Left wrist surgery   2004    with plate  . Colonoscopy    . Esophagogastroduodenoscopy    . Mastectomy, radical    . Mastectomy  modified radical  05/10/2012    Procedure: MASTECTOMY MODIFIED RADICAL;  Surgeon: PMerrie Roof MD;  [place] [facility]  Service: General;  Laterality: Right;  RIGHT MODIFIED RADICAL MASTECTOMY  . Portacath placement  05/10/2012    Procedure: INSERTION PORT-A-CATH;  Surgeon: PMerrie Roof MD;  [place] [facility]  Service: General;  Laterality: Left;  . Decompressive lumbar laminectomy level 4 N/A 02/15/2015    Procedure: DECOMPRESSION T5 AND T3-T7 STABALIZATION;  Surgeon: NConsuella Lose MD;  [facility];  Service: Neurosurgery;  Laterality: N/A;  . Laminectomy N/A 03/27/2015    Procedure: Thoracic Four-Thoracic Six Laminectomy for tumor;  Surgeon: NConsuella Lose MD;  [facility];  Service: Neurosurgery;  Laterality: N/A;  T4-T6 Laminectomy    Allergies: Codeine; Keflex; Naprosyn; Oxycodone; and Tussin  Medications: Prior to Admission medications   Medication Sig Start Date End Date Taking? Authorizing Provider  apixaban (ELIQUIS) 2.5 MG TABS tablet Take 1 tablet (2.5 mg total) by mouth 2 (two) times daily. 04/03/15   NConsuella Lose MD  b complex vitamins tablet Take 1 tablet by mouth daily.    Historical Provider, MD  Calcium Carbonate Antacid (TUMS PO) Take 2 tablets by mouth 2 (two) times daily as needed (acid reflux).    Historical Provider, MD  Calcium Carbonate-Vitamin D (CALCIUM-D PO) Take 1 tablet by mouth daily.    Historical Provider, MD  Cholecalciferol (VITAMIN D) 2000 UNITS tablet Take 2,000 Units by mouth daily.    Historical Provider, MD  Coenzyme Q10 (COQ10) 100 MG CAPS Take 100 mg by mouth at bedtime.    Historical Provider, MD  dexamethasone (DECADRON) 1 MG tablet Take 1 tablet (1 mg total) by mouth 2 (two) times daily with a meal. '2mg'$  PO BID x 5 days then '1mg'$  PO BID x 5 days, then off. 04/04/15   Consuella Lose, MD  gabapentin (NEURONTIN) 400 MG capsule Take 1 capsule (400 mg total) by mouth 3 (three) times daily. 03/11/15   Bary Leriche,  PA-C  lidocaine-prilocaine (EMLA) cream Apply to affected area once Patient taking differently: Apply 1 application topically every 21 ( twenty-one) days. Apply to port prior to infusions 03/23/15   [name], MD  methocarbamol (ROBAXIN) 500 MG tablet Take 1 tablet (500 mg total) by mouth daily as needed for muscle spasms. 03/11/15   Ivan Anchors Love, PA-C  OVER THE COUNTER MEDICATION Place 1 drop into both eyes 2 (two) times daily as needed (dry eyes). Over the counter lubricating eye drops    Historical Provider, MD  polyethylene glycol (MIRALAX / GLYCOLAX) packet Take 17 g by mouth 2 (two) times daily. Patient taking differently: Take 8.5 g by mouth daily as needed (constipation).  03/11/15   Ivan Anchors Love, PA-C  pravastatin (PRAVACHOL) 10 MG tablet Take 10 mg by mouth at bedtime.  03/14/14   Historical Provider, MD  Probiotic Product (PROBIOTIC DAILY PO) Take 1 capsule by mouth 2 (two) times daily.     Historical Provider, MD  ranitidine (ZANTAC) 300 MG tablet Take 300 mg by mouth at bedtime.     Historical Provider, MD  spironolactone (ALDACTONE) 25 MG tablet Take 0.5 tablets (12.5 mg total) by mouth daily. 04/15/13   Minette Headland, NP  traMADol (ULTRAM) 50 MG tablet Take 1 tablet (50 mg total) by mouth every 6 (six) hours as needed for moderate pain. 03/30/15   Consuella Lose, MD     Family History  Problem Relation Age of Onset  . Leukemia Mother   . Colon cancer Brother   . Cancer Brother     Prostate  . Colon cancer Maternal Grandmother   . Stroke Father   . Diabetes Mellitus II Father     Social History   Social History  . Marital Status: Married    Spouse Name: N/A  . Number of Children: 2  . Years of Education: N/A   Social History Main Topics  . Smoking status: Never Smoker   . Smokeless tobacco: Never Used  . Alcohol Use: No  . Drug Use: No  . Sexual Activity: Yes    Birth Control/ Protection: Post-menopausal   Other Topics Concern  . None   Social  History Narrative     Review of Systems: A 12 point ROS discussed and pertinent positives are indicated in the HPI above.  All other systems are negative.  Review of Systems  Vital Signs: BP 131/71 mmHg  Pulse 96  Temp(Src) 97.4 F (36.3 C)  Resp 16  Ht '5\' 4"'$  (1.626 m)  Wt 131 lb (59.421 kg)  BMI 22.47 kg/m2  SpO2 100%  Physical Exam  Constitutional:  She is oriented to person, place, and time. She appears well-developed and well-nourished. No distress.  HENT:  Head: Normocephalic.  Mouth/Throat: Oropharynx is clear and moist.  Neck: Normal range of motion. No tracheal deviation present.  Cardiovascular: Normal rate, regular rhythm and normal heart sounds.   Well healed scar left chest from prior port  Pulmonary/Chest: Effort normal and breath sounds normal. No respiratory distress.  Neurological: She is alert and oriented to person, place, and time.  Psychiatric: She has a normal mood and affect. Judgment normal.    Mallampati Score:  MD Evaluation Airway: WNL Heart: WNL Abdomen: WNL Chest/ Lungs: WNL ASA  Classification: 2 Mallampati/Airway Score: Two   Labs:  CBC:  Recent Labs  03/11/15 1254 03/12/15 0607 03/19/15 1517 03/28/15 0446  WBC 7.2 4.5 3.4* 4.2  HGB 12.6 11.4* 11.5* 7.5*  HCT 37.7 33.9* 34.0* 23.7*  PLT 135* 124* 140* 138*    COAGS:  Recent Labs  03/27/15 1434  INR 0.98  APTT 21*    BMP:  Recent Labs  03/28/15 0446  NA 135  K 3.8  CL 99*  CO2 28  GLUCOSE 159*  BUN 24*  CALCIUM 7.8*  CREATININE 0.78  GFRNONAA >60  GFRAA >60     Assessment and Plan: Metastatic breast cancer Plan for port placement today Eliquis remains on hold. Labs ordered. Risks and Benefits discussed with the patient including, but not limited to bleeding, infection, pneumothorax, or fibrin sheath development and need for additional procedures. All of the patient's questions were answered, patient is agreeable to proceed. Consent signed and in  chart.   Thank you for this interesting consult.    A copy of this report was sent to the requesting provider on this date.  SignedAscencion Dike 04/09/2015, 11:09 AM   I spent a total of 18 minutes in face to face in clinical consultation, greater than 50% of which was counseling/coordinating care for port placement

## 2015-04-09 NOTE — Sedation Documentation (Signed)
Sucessful port placed

## 2015-04-10 ENCOUNTER — Encounter: Payer: Self-pay | Admitting: Hematology and Oncology

## 2015-04-10 ENCOUNTER — Ambulatory Visit (HOSPITAL_BASED_OUTPATIENT_CLINIC_OR_DEPARTMENT_OTHER): Payer: Medicare Other

## 2015-04-10 ENCOUNTER — Ambulatory Visit: Payer: Medicare Other | Admitting: Hematology and Oncology

## 2015-04-10 ENCOUNTER — Ambulatory Visit: Payer: Medicare Other

## 2015-04-10 ENCOUNTER — Other Ambulatory Visit (HOSPITAL_BASED_OUTPATIENT_CLINIC_OR_DEPARTMENT_OTHER): Payer: Medicare Other

## 2015-04-10 ENCOUNTER — Ambulatory Visit (HOSPITAL_BASED_OUTPATIENT_CLINIC_OR_DEPARTMENT_OTHER): Payer: Medicare Other | Admitting: Hematology and Oncology

## 2015-04-10 VITALS — BP 112/69 | HR 99 | Temp 97.8°F | Resp 16

## 2015-04-10 DIAGNOSIS — I10 Essential (primary) hypertension: Secondary | ICD-10-CM | POA: Diagnosis not present

## 2015-04-10 DIAGNOSIS — C50411 Malignant neoplasm of upper-outer quadrant of right female breast: Secondary | ICD-10-CM | POA: Diagnosis not present

## 2015-04-10 DIAGNOSIS — F419 Anxiety disorder, unspecified: Secondary | ICD-10-CM | POA: Diagnosis not present

## 2015-04-10 DIAGNOSIS — E785 Hyperlipidemia, unspecified: Secondary | ICD-10-CM | POA: Diagnosis not present

## 2015-04-10 DIAGNOSIS — Z5112 Encounter for antineoplastic immunotherapy: Secondary | ICD-10-CM | POA: Diagnosis not present

## 2015-04-10 DIAGNOSIS — Z170 Estrogen receptor positive status [ER+]: Secondary | ICD-10-CM

## 2015-04-10 DIAGNOSIS — D649 Anemia, unspecified: Secondary | ICD-10-CM | POA: Diagnosis not present

## 2015-04-10 DIAGNOSIS — C773 Secondary and unspecified malignant neoplasm of axilla and upper limb lymph nodes: Secondary | ICD-10-CM | POA: Diagnosis not present

## 2015-04-10 DIAGNOSIS — C50911 Malignant neoplasm of unspecified site of right female breast: Secondary | ICD-10-CM

## 2015-04-10 DIAGNOSIS — C7951 Secondary malignant neoplasm of bone: Secondary | ICD-10-CM

## 2015-04-10 DIAGNOSIS — Z510 Encounter for antineoplastic radiation therapy: Secondary | ICD-10-CM | POA: Diagnosis not present

## 2015-04-10 DIAGNOSIS — N189 Chronic kidney disease, unspecified: Secondary | ICD-10-CM | POA: Diagnosis not present

## 2015-04-10 LAB — CBC WITH DIFFERENTIAL/PLATELET
BASO%: 0.3 % (ref 0.0–2.0)
Basophils Absolute: 0 10*3/uL (ref 0.0–0.1)
EOS%: 0.2 % (ref 0.0–7.0)
Eosinophils Absolute: 0 10*3/uL (ref 0.0–0.5)
HCT: 30.8 % — ABNORMAL LOW (ref 34.8–46.6)
HGB: 9.6 g/dL — ABNORMAL LOW (ref 11.6–15.9)
LYMPH%: 18.1 % (ref 14.0–49.7)
MCH: 29.9 pg (ref 25.1–34.0)
MCHC: 31.2 g/dL — ABNORMAL LOW (ref 31.5–36.0)
MCV: 96 fL (ref 79.5–101.0)
MONO#: 0.2 10*3/uL (ref 0.1–0.9)
MONO%: 4 % (ref 0.0–14.0)
NEUT#: 4.5 10*3/uL (ref 1.5–6.5)
NEUT%: 77.4 % — ABNORMAL HIGH (ref 38.4–76.8)
Platelets: 195 10*3/uL (ref 145–400)
RBC: 3.21 10*6/uL — ABNORMAL LOW (ref 3.70–5.45)
RDW: 19.3 % — ABNORMAL HIGH (ref 11.2–14.5)
WBC: 5.8 10*3/uL (ref 3.9–10.3)
lymph#: 1.1 10*3/uL (ref 0.9–3.3)
nRBC: 2 % — ABNORMAL HIGH (ref 0–0)

## 2015-04-10 LAB — COMPREHENSIVE METABOLIC PANEL
ALT: 53 U/L (ref 0–55)
AST: 27 U/L (ref 5–34)
Albumin: 2.9 g/dL — ABNORMAL LOW (ref 3.5–5.0)
Alkaline Phosphatase: 110 U/L (ref 40–150)
Anion Gap: 9 mEq/L (ref 3–11)
BUN: 22.3 mg/dL (ref 7.0–26.0)
CO2: 30 mEq/L — ABNORMAL HIGH (ref 22–29)
Calcium: 9.2 mg/dL (ref 8.4–10.4)
Chloride: 97 mEq/L — ABNORMAL LOW (ref 98–109)
Creatinine: 0.8 mg/dL (ref 0.6–1.1)
EGFR: 75 mL/min/{1.73_m2} — ABNORMAL LOW (ref >90)
Glucose: 115 mg/dl (ref 70–140)
Potassium: 3.7 mEq/L (ref 3.5–5.1)
Sodium: 136 mEq/L (ref 136–145)
Total Bilirubin: 0.58 mg/dL (ref 0.20–1.20)
Total Protein: 5.6 g/dL — ABNORMAL LOW (ref 6.4–8.3)

## 2015-04-10 LAB — TECHNOLOGIST REVIEW

## 2015-04-10 MED ORDER — SODIUM CHLORIDE 0.9 % IJ SOLN
10.0000 mL | INTRAMUSCULAR | Status: DC | PRN
  Administered 2015-04-10: 10 mL
  Filled 2015-04-10: qty 10

## 2015-04-10 MED ORDER — SODIUM CHLORIDE 0.9 % IV SOLN
Freq: Once | INTRAVENOUS | Status: AC
  Administered 2015-04-10: 12:00:00 via INTRAVENOUS

## 2015-04-10 MED ORDER — ACETAMINOPHEN 325 MG PO TABS
650.0000 mg | ORAL_TABLET | Freq: Once | ORAL | Status: AC
  Administered 2015-04-10: 650 mg via ORAL

## 2015-04-10 MED ORDER — DIPHENHYDRAMINE HCL 25 MG PO CAPS
50.0000 mg | ORAL_CAPSULE | Freq: Once | ORAL | Status: AC
  Administered 2015-04-10: 50 mg via ORAL

## 2015-04-10 MED ORDER — DIPHENHYDRAMINE HCL 25 MG PO CAPS
ORAL_CAPSULE | ORAL | Status: AC
  Filled 2015-04-10: qty 2

## 2015-04-10 MED ORDER — TRASTUZUMAB CHEMO INJECTION 440 MG
8.0000 mg/kg | Freq: Once | INTRAVENOUS | Status: AC
  Administered 2015-04-10: 483 mg via INTRAVENOUS
  Filled 2015-04-10: qty 23

## 2015-04-10 MED ORDER — ACETAMINOPHEN 325 MG PO TABS
ORAL_TABLET | ORAL | Status: AC
  Filled 2015-04-10: qty 2

## 2015-04-10 MED ORDER — HEPARIN SOD (PORK) LOCK FLUSH 100 UNIT/ML IV SOLN
500.0000 [IU] | Freq: Once | INTRAVENOUS | Status: AC | PRN
  Administered 2015-04-10: 500 [IU]
  Filled 2015-04-10: qty 5

## 2015-04-10 MED ORDER — LIDOCAINE-PRILOCAINE 2.5-2.5 % EX CREA
TOPICAL_CREAM | CUTANEOUS | Status: AC
  Filled 2015-04-10: qty 5

## 2015-04-10 NOTE — Addendum Note (Signed)
Addended by: Prentiss Bells on: 04/10/2015 06:01 PM   Modules accepted: Medications

## 2015-04-10 NOTE — Assessment & Plan Note (Signed)
Right breast invasive ductal carcinoma ER positive PR negative HER-2 positive Ki-67 44% multifocal disease 3/7 lymph nodes positive T2 N1 M0 stage IIB status post adjuvant chemotherapy with TCH followed by Herceptin maintenance, adjuvant radiation therapy, currently on letrozole since 02/07/2013. MRI 02/14/2015: T5 large destructive lesion with pathologic compression fracture and extensive epidural tumor, involvement of T4, excision metastatic carcinoma ER 60%, PR 0%, HER-2 positive ratio 2.71 average copy #7.45 PET CT 03/18/2015:Residual enhancing soft tissue adjacent to the spinal cord. No evidence of metastatic disease. Nonspecific uptake the left nipple. 03/27/2015: T4-6 decompression surgery for spinal cord compression  Recommendation: 1. Radiation therapy to the spine (being done by Dr. [name]) 2. Hold anastrozole (until radiation is complete) 3. Herceptin every 3 week started 04/10/2015 4. Obtain echocardiograms every 3 months  Goal of treatment: Palliation  Return to clinic in 3 weeks for follow-up on Herceptin

## 2015-04-10 NOTE — Progress Notes (Signed)
Patient Care Team: [name], MD as PCP - General (Family Medicine)  DIAGNOSIS: Primary cancer of upper outer quadrant of right female breast [facility])   Staging form: Breast, AJCC 7th Edition     Clinical: Stage IIB (T2, N1, cM0) - Unsigned       Staging comments: Staged at breast conference 11.6.13      Pathologic: No stage assigned - Unsigned   SUMMARY OF ONCOLOGIC HISTORY:   Primary cancer of upper outer quadrant of right female breast ([place])   03/08/2012 Initial Diagnosis Right breast invasive ductal carcinoma ER positive PR negative HER-2 positive Ki-67 44%; another breast mass biopsied in the anterior part of the breast which was also positive for malignancy that was HER-2 negative   05/10/2012 Surgery Right mastectomy and axillary lymph node dissection: Multifocal disease 5 cm, 1.7 cm, 1.6 cm, ER positive PR negative HER-2 positive Ki-67 44%, 3/7 lymph nodes positive   06/14/2012 - 06/12/2013 Chemotherapy Adjuvant chemotherapy with [place] 6 followed by Herceptin maintenance   10/25/2012 - 12/11/2012 Radiation Therapy Adjuvant radiation therapy   02/07/2013 -  Anti-estrogen oral therapy Letrozole 2.5 mg daily   02/14/2015 Imaging MRI spine: Large destructive T5 lesion with severe pathologic compression fracture and extensive epidural tumor, severe spinal stenosis with moderate cord compression, tumor involvement of T4   03/18/2015 PET scan Residual enhancing soft tissue adjacent to the spinal cord. No evidence of metastatic disease. Nonspecific uptake the left nipple   03/27/2015 - 03/30/2015 Hospital Admission T4-6 decompression for spinal cord compression (lower extremity paralysis)   04/10/2015 -  Chemotherapy Palliative treatment with Herceptin every 3 weeks    CHIEF COMPLIANT: Herceptin cycle 1  INTERVAL HISTORY: Heather [name] is a 69 year old with above-mentioned history of metastatic breast cancer with solitary bone metastases involving T5 vertebral body with compression fracture who  underwent decompression surgeries 2 most recently 03/27/2015. She is here today to start palliative treatment with Herceptin. She did not have any other evidence of metastatic disease anywhere else. She is also going to start radiation therapy Monday. She is very disappointed that her leg strength has not improved much more. She is hopeful that she can continue to work on physical exercise and get stronger in the legs to hope that some point she may be able to walk on her own 2 feet.  REVIEW OF SYSTEMS:   Constitutional: Denies fevers, chills or abnormal weight loss Eyes: Denies blurriness of vision Ears, nose, mouth, throat, and face: Denies mucositis or sore throat Respiratory: Denies cough, dyspnea or wheezes Cardiovascular: Denies palpitation, chest discomfort or lower extremity swelling Gastrointestinal:  Denies nausea, heartburn or change in bowel habits Skin: Denies abnormal skin rashes Lymphatics: Denies new lymphadenopathy or easy bruising Neurological:lower extremity paralysis, decreased sensation below the waist level, able to move her feet slightly Behavioral/Psych: Mood is stable, no new changes  All other systems were reviewed with the patient and are negative.  I have reviewed the past medical history, past surgical history, social history and family history with the patient and they are unchanged from previous note.  ALLERGIES:  is allergic to codeine; keflex; naprosyn; oxycodone; and tussin.  MEDICATIONS:  No current facility-administered medications for this visit.   No current outpatient prescriptions on file.   Facility-Administered Medications Ordered in Other Visits  Medication Dose Route Frequency Provider Last Rate Last Dose  . acetaminophen (TYLENOL) tablet 650 mg  650 mg Oral Once [name], MD      . chlorhexidine (HIBICLENS) 4 %  liquid 1 application  1 application Topical Once Autumn Messing III, MD      . chlorhexidine (HIBICLENS) 4 % liquid 1 application  1  application Topical Once Autumn Messing III, MD      . diphenhydrAMINE (BENADRYL) capsule 50 mg  50 mg Oral Once [name], MD      . heparin lock flush 100 unit/mL  500 Units Intracatheter Once PRN [name], MD      . sodium chloride 0.9 % injection 10 mL  10 mL Intracatheter PRN [name], MD      . trastuzumab (HERCEPTIN) 483 mg in sodium chloride 0.9 % 250 mL chemo infusion  8 mg/kg (Treatment Plan Actual) Intravenous Once [name], MD        PHYSICAL EXAMINATION: ECOG PERFORMANCE STATUS: 2 - Symptomatic, <50% confined to bed  Filed Vitals:   04/10/15 1029  BP: 112/69  Pulse: 99  Temp: 97.8 F (36.6 C)  Resp: 16   Filed Weights    GENERAL:alert, no distress and comfortable SKIN: skin color, texture, turgor are normal, no rashes or significant lesions EYES: normal, Conjunctiva are pink and non-injected, sclera clear OROPHARYNX:no exudate, no erythema and lips, buccal mucosa, and tongue normal  NECK: supple, thyroid normal size, non-tender, without nodularity LYMPH:  no palpable lymphadenopathy in the cervical, axillary or inguinal LUNGS: clear to auscultation and percussion with normal breathing effort HEART: regular rate & rhythm and no murmurs and no lower extremity edema ABDOMEN:abdomen soft, non-tender and normal bowel sounds Musculoskeletal:no cyanosis of digits and no clubbing  NEURO: alert & oriented x 3 with fluent speech, lower extremity paralysis as well as loss of sensation below the waist, lower extremity strength 2/5  LABORATORY DATA:  I have reviewed the data as listed   Chemistry      Component Value Date/Time   NA 136 04/10/2015 0956   NA 135 03/28/2015 0446   K 3.7 04/10/2015 0956   K 3.8 03/28/2015 0446   CL 99* 03/28/2015 0446   CL 101 10/25/2012 0946   CO2 30* 04/10/2015 0956   CO2 28 03/28/2015 0446   BUN 22.3 04/10/2015 0956   BUN 24* 03/28/2015 0446   CREATININE 0.8 04/10/2015 0956   CREATININE 0.78 03/28/2015 0446      Component  Value Date/Time   CALCIUM 9.2 04/10/2015 0956   CALCIUM 7.8* 03/28/2015 0446   ALKPHOS 110 04/10/2015 0956   ALKPHOS 124 03/27/2015 1434   AST 27 04/10/2015 0956   AST 42* 03/27/2015 1434   ALT 53 04/10/2015 0956   ALT 136* 03/27/2015 1434   BILITOT 0.58 04/10/2015 0956   BILITOT 0.9 03/27/2015 1434       Lab Results  Component Value Date   WBC 5.8 04/10/2015   HGB 9.6* 04/10/2015   HCT 30.8* 04/10/2015   MCV 96.0 04/10/2015   PLT 195 04/10/2015   NEUTROABS 4.5 04/10/2015     RADIOGRAPHIC STUDIES: I have personally reviewed the radiology reports and agreed with their findings. Ir Fluoro Guide Cv Line Right  04/09/2015  CLINICAL DATA:  69 year old female with a history of breast cancer and a new metastatic disease to the spine. She requires durable central venous access for chemotherapy. EXAM: IR RIGHT FLOURO GUIDE CV LINE; IR ULTRASOUND GUIDANCE VASC ACCESS RIGHT Date: 04/09/2015 ANESTHESIA/SEDATION: Moderate (conscious) sedation was administered during this procedure. A total of 2 mg Versed and 100 mg Fentanyl were administered intravenously. The patient's vital signs were monitored continuously by radiology nursing throughout  the course of the procedure. Total sedation time: 20 minutes FLUOROSCOPY TIME:  6 seconds for a total of 1.8 mGy TECHNIQUE: The right neck and chest was prepped with chlorhexidine, and draped in the usual sterile fashion using maximum barrier technique (cap and mask, sterile gown, sterile gloves, large sterile sheet, hand hygiene and cutaneous antiseptic). Antibiotic prophylaxis was provided with g Ancef administered IV one hour prior to skin incision. Local anesthesia was attained by infiltration with 1% lidocaine with epinephrine. Ultrasound demonstrated patency of the right internal jugular vein, and this was documented with an image. Under real-time ultrasound guidance, this vein was accessed with a 21 gauge micropuncture needle and image documentation was  performed. A small dermatotomy was made at the access site with an 11 scalpel. A 0.018" wire was advanced into the SVC and the access needle exchanged for a 49F micropuncture vascular sheath. The 0.018" wire was then removed and a 0.035" wire advanced into the IVC. An appropriate location for the subcutaneous reservoir was selected below the clavicle and an incision was made through the skin and underlying soft tissues. The subcutaneous tissues were then dissected using a combination of blunt and sharp surgical technique and a pocket was formed. A single lumen power injectable portacatheter was then tunneled through the subcutaneous tissues from the pocket to the dermatotomy and the port reservoir placed within the subcutaneous pocket. The venous access site was then serially dilated and a peel away vascular sheath placed over the wire. The wire was removed and the port catheter advanced into position under fluoroscopic guidance. The catheter tip is positioned in the upper right atrium. This was documented with a spot image. The portacatheter was then tested and found to flush and aspirate well. The port was flushed with saline followed by 100 units/mL heparinized saline. The pocket was then closed in two layers using first subdermal inverted interrupted absorbable sutures followed by a running subcuticular suture. The epidermis was then sealed with Dermabond. The dermatotomy at the venous access site was also sealed with Dermabond. COMPLICATIONS: None.  The patient tolerated the procedure well. IMPRESSION: Successful placement of a right IJ approach Power Port with ultrasound and fluoroscopic guidance. The catheter is ready for use. Signed, Criselda Peaches, MD [facility] Radiology Electronically Signed   By: Jacqulynn Cadet M.D.   On: 04/09/2015 14:58   Ir US Guide Vasc Access Right  04/09/2015  CLINICAL DATA:  69 year old female with a history of breast  cancer and a new metastatic disease to the spine. She requires durable central venous access for chemotherapy. EXAM: IR RIGHT FLOURO GUIDE CV LINE; IR ULTRASOUND GUIDANCE VASC ACCESS RIGHT Date: 04/09/2015 ANESTHESIA/SEDATION: Moderate (conscious) sedation was administered during this procedure. A total of 2 mg Versed and 100 mg Fentanyl were administered intravenously. The patient's vital signs were monitored continuously by radiology nursing throughout the course of the procedure. Total sedation time: 20 minutes FLUOROSCOPY TIME:  6 seconds for a total of 1.8 mGy TECHNIQUE: The right neck and chest was prepped with chlorhexidine, and draped in the usual sterile fashion using maximum barrier technique (cap and mask, sterile gown, sterile gloves, large sterile sheet, hand hygiene and cutaneous antiseptic). Antibiotic prophylaxis was provided with g Ancef administered IV one hour prior to skin incision. Local anesthesia was attained by infiltration with 1% lidocaine with epinephrine. Ultrasound demonstrated patency of the right internal jugular vein, and this was documented with an image. Under real-time ultrasound guidance, this vein was accessed  with a 21 gauge micropuncture needle and image documentation was performed. A small dermatotomy was made at the access site with an 11 scalpel. A 0.018" wire was advanced into the SVC and the access needle exchanged for a 7F micropuncture vascular sheath. The 0.018" wire was then removed and a 0.035" wire advanced into the IVC. An appropriate location for the subcutaneous reservoir was selected below the clavicle and an incision was made through the skin and underlying soft tissues. The subcutaneous tissues were then dissected using a combination of blunt and sharp surgical technique and a pocket was formed. A single lumen power injectable portacatheter was then tunneled through the subcutaneous tissues from the pocket to the dermatotomy and the port reservoir placed within  the subcutaneous pocket. The venous access site was then serially dilated and a peel away vascular sheath placed over the wire. The wire was removed and the port catheter advanced into position under fluoroscopic guidance. The catheter tip is positioned in the upper right atrium. This was documented with a spot image. The portacatheter was then tested and found to flush and aspirate well. The port was flushed with saline followed by 100 units/mL heparinized saline. The pocket was then closed in two layers using first subdermal inverted interrupted absorbable sutures followed by a running subcuticular suture. The epidermis was then sealed with Dermabond. The dermatotomy at the venous access site was also sealed with Dermabond. COMPLICATIONS: None.  The patient tolerated the procedure well. IMPRESSION: Successful placement of a right IJ approach Power Port with ultrasound and fluoroscopic guidance. The catheter is ready for use. Signed, Criselda Peaches, MD [facility] Radiology Electronically Signed   By: Jacqulynn Cadet M.D.   On: 04/09/2015 14:58     ASSESSMENT & PLAN:  Primary cancer of upper outer quadrant of right female breast ([place]) Right breast invasive ductal carcinoma ER positive PR negative HER-2 positive Ki-67 44% multifocal disease 3/7 lymph nodes positive T2 N1 M0 stage IIB status post adjuvant chemotherapy with TCH followed by Herceptin maintenance, adjuvant radiation therapy, currently on letrozole since 02/07/2013. MRI 02/14/2015: T5 large destructive lesion with pathologic compression fracture and extensive epidural tumor, involvement of T4, excision metastatic carcinoma ER 60%, PR 0%, HER-2 positive ratio 2.71 average copy #7.45 PET CT 03/18/2015:Residual enhancing soft tissue adjacent to the spinal cord. No evidence of metastatic disease. Nonspecific uptake the left nipple. 03/27/2015: T4-6 decompression surgery for spinal cord  compression  Recommendation: 1. Radiation therapy to the spine (Dr. [name]) to start 04/13/2015 2. Hold anastrozole (until radiation is complete) 3. Herceptin every 3 week started 04/10/2015 4. Obtain echocardiograms every 3 months  Bone metastases: Patient will receive Zometa every 3 weeks with calcium and vitamin D  Goal of treatment: Palliation  Return to clinic in 3 weeks for follow-up on Herceptin   No orders of the defined types were placed in this encounter.   The patient has a good understanding of the overall plan. she agrees with it. she will call with any problems that may develop before the next visit here.   Rulon Eisenmenger, MD 04/10/2015

## 2015-04-10 NOTE — Progress Notes (Signed)
OK to treat today without ECHO per [name].  Pharmacy notified.

## 2015-04-10 NOTE — Patient Instructions (Signed)
Cancer Center Discharge Instructions for Patients Receiving Chemotherapy  Today you received the following chemotherapy agents :  Herceptin.  To help prevent nausea and vomiting after your treatment, we encourage you to take your nausea medication as instructed by your physician.   If you develop nausea and vomiting that is not controlled by your nausea medication, call the clinic.   BELOW ARE SYMPTOMS THAT SHOULD BE REPORTED IMMEDIATELY:  *FEVER GREATER THAN 100.5 F  *CHILLS WITH OR WITHOUT FEVER  NAUSEA AND VOMITING THAT IS NOT CONTROLLED WITH YOUR NAUSEA MEDICATION  *UNUSUAL SHORTNESS OF BREATH  *UNUSUAL BRUISING OR BLEEDING  TENDERNESS IN MOUTH AND THROAT WITH OR WITHOUT PRESENCE OF ULCERS  *URINARY PROBLEMS  *BOWEL PROBLEMS  UNUSUAL RASH Items with * indicate a potential emergency and should be followed up as soon as possible.  Feel free to call the clinic you have any questions or concerns. The clinic phone number is [phone].    

## 2015-04-13 ENCOUNTER — Telehealth: Payer: Self-pay

## 2015-04-13 ENCOUNTER — Ambulatory Visit
Admission: RE | Admit: 2015-04-13 | Discharge: 2015-04-13 | Disposition: A | Discharge status: Home / Self Care | Payer: Medicare Other | Source: Ambulatory Visit | Attending: Radiation Oncology | Admitting: Radiation Oncology

## 2015-04-13 ENCOUNTER — Telehealth: Payer: Self-pay | Admitting: Hematology and Oncology

## 2015-04-13 DIAGNOSIS — I10 Essential (primary) hypertension: Secondary | ICD-10-CM | POA: Diagnosis not present

## 2015-04-13 DIAGNOSIS — C50919 Malignant neoplasm of unspecified site of unspecified female breast: Secondary | ICD-10-CM

## 2015-04-13 DIAGNOSIS — C7951 Secondary malignant neoplasm of bone: Secondary | ICD-10-CM | POA: Diagnosis not present

## 2015-04-13 DIAGNOSIS — E785 Hyperlipidemia, unspecified: Secondary | ICD-10-CM | POA: Diagnosis not present

## 2015-04-13 DIAGNOSIS — N189 Chronic kidney disease, unspecified: Secondary | ICD-10-CM | POA: Diagnosis not present

## 2015-04-13 DIAGNOSIS — Z510 Encounter for antineoplastic radiation therapy: Secondary | ICD-10-CM | POA: Diagnosis not present

## 2015-04-13 DIAGNOSIS — C50911 Malignant neoplasm of unspecified site of right female breast: Secondary | ICD-10-CM | POA: Diagnosis not present

## 2015-04-13 NOTE — Telephone Encounter (Signed)
LMOVM - Refill request forwarded to neurosurgeon.  Echo appt 12/12 at 11 am.  Pt to call clinic with any questions.

## 2015-04-13 NOTE — Telephone Encounter (Signed)
Patient is established with [facility] and will get a call from them for the appointment and follow up echo

## 2015-04-13 NOTE — Progress Notes (Signed)
  Radiation Oncology         [phone]   Name: Heather [name] MRN: [id number]   Date: 04/13/2015  DOB: [birth year]   Weekly Radiation Therapy Management    ICD-9-CM ICD-10-CM   1. Breast cancer metastasized to bone, unspecified laterality (HCC) 174.9 C50.919    198.5 C79.51     Current Dose: 1.8 Gy  Planned Dose:  45 Gy  Narrative The patient presents for routine under treatment assessment.  Patient has questions regarding number of treatments today after she was notified that she would be getting 25 treatments. The patient initially understood that she would be receiving just 10 treatments.    She presents today with a complaint of weakness. She states she is so weak that she is unable to undress and gown herself for radiation treatment. She has asked that she be assisted with dressing on the treatment bed.  Set-up films were reviewed. The chart was checked.  Physical Findings Weight essentially stable.  No significant changes.  Impression The patient is tolerating radiation.   Plan Continue treatment as planned. I have notified treatment techs of patient's weakness issues.      Sheral Apley [name], M.D.   This document serves as a record of services personally performed by Tyler Pita, MD. It was created on his behalf by Derek Mound, a trained medical scribe. The creation of this record is based on the scribe's personal observations and the provider's statements to them. This document has been checked and approved by the attending provider.

## 2015-04-13 NOTE — Progress Notes (Signed)
Called [employer] re: ECHO.  Per [employer] no prior authorization required.

## 2015-04-14 ENCOUNTER — Other Ambulatory Visit: Payer: Self-pay

## 2015-04-14 ENCOUNTER — Ambulatory Visit (HOSPITAL_COMMUNITY): Payer: Medicare Other

## 2015-04-14 ENCOUNTER — Ambulatory Visit
Admission: RE | Admit: 2015-04-14 | Discharge: 2015-04-14 | Disposition: A | Discharge status: Home / Self Care | Payer: Medicare Other | Source: Ambulatory Visit | Attending: Radiation Oncology | Admitting: Radiation Oncology

## 2015-04-14 DIAGNOSIS — N189 Chronic kidney disease, unspecified: Secondary | ICD-10-CM | POA: Diagnosis not present

## 2015-04-14 DIAGNOSIS — H18411 Arcus senilis, right eye: Secondary | ICD-10-CM | POA: Diagnosis not present

## 2015-04-14 DIAGNOSIS — Z510 Encounter for antineoplastic radiation therapy: Secondary | ICD-10-CM | POA: Diagnosis not present

## 2015-04-14 DIAGNOSIS — C7951 Secondary malignant neoplasm of bone: Secondary | ICD-10-CM | POA: Diagnosis not present

## 2015-04-14 DIAGNOSIS — H02839 Dermatochalasis of unspecified eye, unspecified eyelid: Secondary | ICD-10-CM | POA: Diagnosis not present

## 2015-04-14 DIAGNOSIS — E785 Hyperlipidemia, unspecified: Secondary | ICD-10-CM | POA: Diagnosis not present

## 2015-04-14 DIAGNOSIS — I10 Essential (primary) hypertension: Secondary | ICD-10-CM | POA: Diagnosis not present

## 2015-04-14 DIAGNOSIS — H04122 Dry eye syndrome of left lacrimal gland: Secondary | ICD-10-CM | POA: Diagnosis not present

## 2015-04-14 DIAGNOSIS — H04121 Dry eye syndrome of right lacrimal gland: Secondary | ICD-10-CM | POA: Diagnosis not present

## 2015-04-14 DIAGNOSIS — C50911 Malignant neoplasm of unspecified site of right female breast: Secondary | ICD-10-CM | POA: Diagnosis not present

## 2015-04-14 MED ORDER — APIXABAN 2.5 MG PO TABS: 2.5000 mg | ORAL_TABLET | Freq: Two times a day (BID) | ORAL | Status: DC

## 2015-04-14 NOTE — Telephone Encounter (Signed)
Let pt know Rx sent to [facility] and receipt confirmed.  Pt voiced understanding.

## 2015-04-15 ENCOUNTER — Ambulatory Visit
Admission: RE | Admit: 2015-04-15 | Discharge: 2015-04-15 | Disposition: A | Discharge status: Home / Self Care | Payer: Medicare Other | Source: Ambulatory Visit | Attending: Radiation Oncology | Admitting: Radiation Oncology

## 2015-04-15 DIAGNOSIS — G8222 Paraplegia, incomplete: Secondary | ICD-10-CM | POA: Diagnosis not present

## 2015-04-15 DIAGNOSIS — C50411 Malignant neoplasm of upper-outer quadrant of right female breast: Secondary | ICD-10-CM | POA: Diagnosis not present

## 2015-04-15 DIAGNOSIS — N390 Urinary tract infection, site not specified: Secondary | ICD-10-CM | POA: Diagnosis not present

## 2015-04-15 DIAGNOSIS — C50911 Malignant neoplasm of unspecified site of right female breast: Secondary | ICD-10-CM | POA: Diagnosis not present

## 2015-04-15 DIAGNOSIS — E785 Hyperlipidemia, unspecified: Secondary | ICD-10-CM | POA: Diagnosis not present

## 2015-04-15 DIAGNOSIS — Z510 Encounter for antineoplastic radiation therapy: Secondary | ICD-10-CM | POA: Diagnosis not present

## 2015-04-15 DIAGNOSIS — N189 Chronic kidney disease, unspecified: Secondary | ICD-10-CM | POA: Diagnosis not present

## 2015-04-15 DIAGNOSIS — I129 Hypertensive chronic kidney disease with stage 1 through stage 4 chronic kidney disease, or unspecified chronic kidney disease: Secondary | ICD-10-CM | POA: Diagnosis not present

## 2015-04-15 DIAGNOSIS — I10 Essential (primary) hypertension: Secondary | ICD-10-CM | POA: Diagnosis not present

## 2015-04-15 DIAGNOSIS — M8458XD Pathological fracture in neoplastic disease, other specified site, subsequent encounter for fracture with routine healing: Secondary | ICD-10-CM | POA: Diagnosis not present

## 2015-04-15 DIAGNOSIS — C7951 Secondary malignant neoplasm of bone: Secondary | ICD-10-CM | POA: Diagnosis not present

## 2015-04-16 ENCOUNTER — Telehealth: Payer: Self-pay | Admitting: Oncology

## 2015-04-16 ENCOUNTER — Other Ambulatory Visit: Payer: Self-pay

## 2015-04-16 ENCOUNTER — Other Ambulatory Visit: Payer: Self-pay | Admitting: Neurosurgery

## 2015-04-16 ENCOUNTER — Telehealth: Payer: Self-pay | Admitting: *Deleted

## 2015-04-16 ENCOUNTER — Ambulatory Visit
Admission: RE | Admit: 2015-04-16 | Discharge: 2015-04-16 | Disposition: A | Discharge status: Home / Self Care | Payer: Medicare Other | Source: Ambulatory Visit | Attending: Radiation Oncology | Admitting: Radiation Oncology

## 2015-04-16 DIAGNOSIS — C50411 Malignant neoplasm of upper-outer quadrant of right female breast: Secondary | ICD-10-CM

## 2015-04-16 DIAGNOSIS — N189 Chronic kidney disease, unspecified: Secondary | ICD-10-CM | POA: Diagnosis not present

## 2015-04-16 DIAGNOSIS — N390 Urinary tract infection, site not specified: Secondary | ICD-10-CM | POA: Diagnosis not present

## 2015-04-16 DIAGNOSIS — I129 Hypertensive chronic kidney disease with stage 1 through stage 4 chronic kidney disease, or unspecified chronic kidney disease: Secondary | ICD-10-CM | POA: Diagnosis not present

## 2015-04-16 DIAGNOSIS — E785 Hyperlipidemia, unspecified: Secondary | ICD-10-CM | POA: Diagnosis not present

## 2015-04-16 DIAGNOSIS — M8458XD Pathological fracture in neoplastic disease, other specified site, subsequent encounter for fracture with routine healing: Secondary | ICD-10-CM | POA: Diagnosis not present

## 2015-04-16 DIAGNOSIS — C7951 Secondary malignant neoplasm of bone: Secondary | ICD-10-CM | POA: Diagnosis not present

## 2015-04-16 DIAGNOSIS — C50911 Malignant neoplasm of unspecified site of right female breast: Secondary | ICD-10-CM | POA: Diagnosis not present

## 2015-04-16 DIAGNOSIS — Z510 Encounter for antineoplastic radiation therapy: Secondary | ICD-10-CM | POA: Diagnosis not present

## 2015-04-16 DIAGNOSIS — R519 Headache, unspecified: Secondary | ICD-10-CM

## 2015-04-16 DIAGNOSIS — I10 Essential (primary) hypertension: Secondary | ICD-10-CM | POA: Diagnosis not present

## 2015-04-16 DIAGNOSIS — G8222 Paraplegia, incomplete: Secondary | ICD-10-CM | POA: Diagnosis not present

## 2015-04-16 DIAGNOSIS — R51 Headache: Principal | ICD-10-CM

## 2015-04-16 NOTE — Progress Notes (Addendum)
Patient reports having progressive swelling in both legs, right greater than left.  She reports the swelling started about a week ago and has moved up her legs to her mid thigh level.  She reports pain in her right knee when she lays down at night.  Her right leg is edematous without redness.  Her left legs is less edematous with an area of pinkness on her shin.  [name] contacted about the swelling and will order a doppler.  Received call from [facility] and patient is scheduled for 8 am tomorrow.  Notified patient and Dr. [name].

## 2015-04-16 NOTE — Telephone Encounter (Signed)
Left a message for Heather [name] to notify her that her radiation appointment has been moved to 10:40 am tomorrow so that it is closer to her doppler appointment at 8 am.

## 2015-04-16 NOTE — Telephone Encounter (Signed)
[facility] called requesting HHOT orders 2wk4.Marland Kitchen  Approval given.

## 2015-04-16 NOTE — Progress Notes (Signed)
Swelling reported by Heather [name] in [place].  Per [name], doppler LE bilateral.  Appt made.  LMOVM for pt with appt d/t.  Notified Heather [name] in [place].

## 2015-04-17 ENCOUNTER — Encounter: Payer: Self-pay | Admitting: Radiation Oncology

## 2015-04-17 ENCOUNTER — Other Ambulatory Visit: Payer: Self-pay | Admitting: Hematology and Oncology

## 2015-04-17 ENCOUNTER — Ambulatory Visit
Admission: RE | Admit: 2015-04-17 | Discharge: 2015-04-17 | Disposition: A | Discharge status: Home / Self Care | Payer: Medicare Other | Source: Ambulatory Visit | Attending: Radiation Oncology | Admitting: Radiation Oncology

## 2015-04-17 ENCOUNTER — Ambulatory Visit (HOSPITAL_COMMUNITY)
Admission: RE | Admit: 2015-04-17 | Discharge: 2015-04-17 | Disposition: A | Discharge status: Home / Self Care | Payer: Medicare Other | Source: Ambulatory Visit | Attending: Hematology and Oncology | Admitting: Hematology and Oncology

## 2015-04-17 ENCOUNTER — Ambulatory Visit (HOSPITAL_BASED_OUTPATIENT_CLINIC_OR_DEPARTMENT_OTHER)
Admission: RE | Admit: 2015-04-17 | Discharge: 2015-04-17 | Disposition: A | Discharge status: Home / Self Care | Payer: Medicare Other | Source: Ambulatory Visit | Attending: Hematology and Oncology | Admitting: Hematology and Oncology

## 2015-04-17 ENCOUNTER — Telehealth: Payer: Self-pay | Admitting: *Deleted

## 2015-04-17 VITALS — BP 131/60 | HR 81 | Resp 16

## 2015-04-17 DIAGNOSIS — C50911 Malignant neoplasm of unspecified site of right female breast: Secondary | ICD-10-CM

## 2015-04-17 DIAGNOSIS — E785 Hyperlipidemia, unspecified: Secondary | ICD-10-CM | POA: Insufficient documentation

## 2015-04-17 DIAGNOSIS — I10 Essential (primary) hypertension: Secondary | ICD-10-CM | POA: Diagnosis not present

## 2015-04-17 DIAGNOSIS — I340 Nonrheumatic mitral (valve) insufficiency: Secondary | ICD-10-CM | POA: Insufficient documentation

## 2015-04-17 DIAGNOSIS — C50411 Malignant neoplasm of upper-outer quadrant of right female breast: Secondary | ICD-10-CM | POA: Insufficient documentation

## 2015-04-17 DIAGNOSIS — C50919 Malignant neoplasm of unspecified site of unspecified female breast: Secondary | ICD-10-CM | POA: Diagnosis not present

## 2015-04-17 DIAGNOSIS — Z79899 Other long term (current) drug therapy: Secondary | ICD-10-CM

## 2015-04-17 DIAGNOSIS — C7951 Secondary malignant neoplasm of bone: Principal | ICD-10-CM

## 2015-04-17 DIAGNOSIS — R609 Edema, unspecified: Secondary | ICD-10-CM

## 2015-04-17 DIAGNOSIS — I129 Hypertensive chronic kidney disease with stage 1 through stage 4 chronic kidney disease, or unspecified chronic kidney disease: Secondary | ICD-10-CM | POA: Insufficient documentation

## 2015-04-17 DIAGNOSIS — N189 Chronic kidney disease, unspecified: Secondary | ICD-10-CM | POA: Insufficient documentation

## 2015-04-17 DIAGNOSIS — Z5181 Encounter for therapeutic drug level monitoring: Secondary | ICD-10-CM

## 2015-04-17 DIAGNOSIS — K219 Gastro-esophageal reflux disease without esophagitis: Secondary | ICD-10-CM | POA: Diagnosis not present

## 2015-04-17 DIAGNOSIS — Z09 Encounter for follow-up examination after completed treatment for conditions other than malignant neoplasm: Secondary | ICD-10-CM | POA: Insufficient documentation

## 2015-04-17 DIAGNOSIS — Z510 Encounter for antineoplastic radiation therapy: Secondary | ICD-10-CM | POA: Diagnosis not present

## 2015-04-17 NOTE — Telephone Encounter (Signed)
Received call from [facility] vascular lab, patient with + LLE DVT. [name] notified, patient already on blood thinners. Returned call to vascular lab and patient advised to continue on same dose.

## 2015-04-17 NOTE — Progress Notes (Signed)
  Echocardiogram 2D Echocardiogram has been performed.  Heather [name] 04/17/2015, 9:19 AM

## 2015-04-17 NOTE — Progress Notes (Addendum)
Vitals stable. Denies taking decadron. Patient reports discomfort in her thoracic spine while sitting. She reports pain when she pulls up to shower or use the restroom. Reports taking tramadol for pain. Discussed scheduling tramadol to be taken thirty minutes prior to treatment to better tolerate pain felt from treatment mold. Edema of both lower extremities noted. TED hose noted. Patient aware of LLE DVT. Reports that she recently had a bowel movement and is able to void. No skin changes noted within treatment field. Oriented patient and her husband to staff and routine of the clinic. Provided patient with RADIATION THERAPY AND YOU handbook then, reviewed pertinent information. Educated patient reference potential side effects and management such as, fatigue and skin changes. Encouraged patient to contact this RN with future needs and provided my business card. Patient verbalized understanding of all reviewed.   BP 131/60 mmHg  Pulse 81  Resp 16  Wt   SpO2 100% Wt Readings from Last 3 Encounters:  04/09/15 131 lb (59.421 kg)  03/27/15 131 lb (59.421 kg)  03/19/15 131 lb (59.421 kg)

## 2015-04-17 NOTE — Progress Notes (Signed)
  Radiation Oncology         [phone]     Name: Heather [name] MRN: [id number]   Date: 04/17/2015  DOB: Nov 12, 1945   Weekly Radiation Therapy Management    ICD-9-CM ICD-10-CM   1. Breast cancer metastasized to bone, unspecified laterality (HCC) 174.9 C50.919    198.5 C79.51     Current Dose: 11 Gy  Planned Dose:  55 Gy  Narrative The patient presents for routine under treatment assessment.  Vitals stable. Denies taking decadron. Patient reports discomfort in her thoracic spine while sitting. She reports pain when she pulls up to shower or use the restroom. Reports taking tramadol for pain. Discussed scheduling tramadol to be taken thirty minutes prior to treatment to better tolerate pain felt from treatment mold. Edema of both lower extremities noted. TED hose noted. Patient aware of LLE DVT. Reports that she recently had a bowel movement and is able to void. No skin changes noted within treatment field. The patient questioned if she should wear her compression hose while sleeping.   The patient is without complaint. Set-up films were reviewed. The chart was checked.  Physical Findings  blood pressure is 131/60 and her pulse is 81. Her respiration is 16 and oxygen saturation is 100%. . Weight essentially stable.  No significant changes.  Impression The patient is tolerating radiation.  Plan Continue treatment as planned. Advised the patient that it is okay not to wear her compression hose while sleeping.          [name], M.D.  This document serves as a record of services personally performed by Tyler Pita, MD. It was created on his behalf by Arlyce Harman, a trained medical scribe. The creation of this record is based on the scribe's personal observations and the provider's statements to them. This document has been checked and approved by the attending provider.

## 2015-04-17 NOTE — Progress Notes (Signed)
*  Preliminary Results* Bilateral lower extremity venous duplex completed. Right lower extremity is negative for deep vein thrombosis. The left lower extremity is positive for deep vein thrombosis involving the left posterior tibial vein.   04/17/2015  Maudry Mayhew, RVT, RDCS, RDMS

## 2015-04-20 ENCOUNTER — Other Ambulatory Visit (HOSPITAL_COMMUNITY): Payer: Medicare Other

## 2015-04-20 ENCOUNTER — Ambulatory Visit
Admission: RE | Admit: 2015-04-20 | Discharge: 2015-04-20 | Disposition: A | Discharge status: Home / Self Care | Payer: Medicare Other | Source: Ambulatory Visit | Attending: Radiation Oncology | Admitting: Radiation Oncology

## 2015-04-20 ENCOUNTER — Ambulatory Visit (HOSPITAL_COMMUNITY): Payer: Medicare Other

## 2015-04-20 DIAGNOSIS — E785 Hyperlipidemia, unspecified: Secondary | ICD-10-CM | POA: Diagnosis not present

## 2015-04-20 DIAGNOSIS — N189 Chronic kidney disease, unspecified: Secondary | ICD-10-CM | POA: Diagnosis not present

## 2015-04-20 DIAGNOSIS — Z510 Encounter for antineoplastic radiation therapy: Secondary | ICD-10-CM | POA: Diagnosis not present

## 2015-04-20 DIAGNOSIS — I10 Essential (primary) hypertension: Secondary | ICD-10-CM | POA: Diagnosis not present

## 2015-04-20 DIAGNOSIS — C50911 Malignant neoplasm of unspecified site of right female breast: Secondary | ICD-10-CM | POA: Diagnosis not present

## 2015-04-20 DIAGNOSIS — C7951 Secondary malignant neoplasm of bone: Secondary | ICD-10-CM | POA: Diagnosis not present

## 2015-04-21 ENCOUNTER — Ambulatory Visit
Admission: RE | Admit: 2015-04-21 | Discharge: 2015-04-21 | Disposition: A | Discharge status: Home / Self Care | Payer: Medicare Other | Source: Ambulatory Visit | Attending: Radiation Oncology | Admitting: Radiation Oncology

## 2015-04-21 DIAGNOSIS — G8222 Paraplegia, incomplete: Secondary | ICD-10-CM | POA: Diagnosis not present

## 2015-04-21 DIAGNOSIS — C7951 Secondary malignant neoplasm of bone: Secondary | ICD-10-CM | POA: Diagnosis not present

## 2015-04-21 DIAGNOSIS — N390 Urinary tract infection, site not specified: Secondary | ICD-10-CM | POA: Diagnosis not present

## 2015-04-21 DIAGNOSIS — E785 Hyperlipidemia, unspecified: Secondary | ICD-10-CM | POA: Diagnosis not present

## 2015-04-21 DIAGNOSIS — I129 Hypertensive chronic kidney disease with stage 1 through stage 4 chronic kidney disease, or unspecified chronic kidney disease: Secondary | ICD-10-CM | POA: Diagnosis not present

## 2015-04-21 DIAGNOSIS — N189 Chronic kidney disease, unspecified: Secondary | ICD-10-CM | POA: Diagnosis not present

## 2015-04-21 DIAGNOSIS — C50911 Malignant neoplasm of unspecified site of right female breast: Secondary | ICD-10-CM | POA: Diagnosis not present

## 2015-04-21 DIAGNOSIS — C50411 Malignant neoplasm of upper-outer quadrant of right female breast: Secondary | ICD-10-CM | POA: Diagnosis not present

## 2015-04-21 DIAGNOSIS — Z510 Encounter for antineoplastic radiation therapy: Secondary | ICD-10-CM | POA: Diagnosis not present

## 2015-04-21 DIAGNOSIS — I10 Essential (primary) hypertension: Secondary | ICD-10-CM | POA: Diagnosis not present

## 2015-04-21 DIAGNOSIS — M8458XD Pathological fracture in neoplastic disease, other specified site, subsequent encounter for fracture with routine healing: Secondary | ICD-10-CM | POA: Diagnosis not present

## 2015-04-22 ENCOUNTER — Ambulatory Visit
Admission: RE | Admit: 2015-04-22 | Discharge: 2015-04-22 | Disposition: A | Discharge status: Home / Self Care | Payer: Medicare Other | Source: Ambulatory Visit | Attending: Radiation Oncology | Admitting: Radiation Oncology

## 2015-04-22 DIAGNOSIS — E785 Hyperlipidemia, unspecified: Secondary | ICD-10-CM | POA: Diagnosis not present

## 2015-04-22 DIAGNOSIS — N390 Urinary tract infection, site not specified: Secondary | ICD-10-CM | POA: Diagnosis not present

## 2015-04-22 DIAGNOSIS — G8222 Paraplegia, incomplete: Secondary | ICD-10-CM | POA: Diagnosis not present

## 2015-04-22 DIAGNOSIS — Z510 Encounter for antineoplastic radiation therapy: Secondary | ICD-10-CM | POA: Diagnosis not present

## 2015-04-22 DIAGNOSIS — I129 Hypertensive chronic kidney disease with stage 1 through stage 4 chronic kidney disease, or unspecified chronic kidney disease: Secondary | ICD-10-CM | POA: Diagnosis not present

## 2015-04-22 DIAGNOSIS — C7951 Secondary malignant neoplasm of bone: Secondary | ICD-10-CM | POA: Diagnosis not present

## 2015-04-22 DIAGNOSIS — C50411 Malignant neoplasm of upper-outer quadrant of right female breast: Secondary | ICD-10-CM | POA: Diagnosis not present

## 2015-04-22 DIAGNOSIS — I10 Essential (primary) hypertension: Secondary | ICD-10-CM | POA: Diagnosis not present

## 2015-04-22 DIAGNOSIS — M8458XD Pathological fracture in neoplastic disease, other specified site, subsequent encounter for fracture with routine healing: Secondary | ICD-10-CM | POA: Diagnosis not present

## 2015-04-22 DIAGNOSIS — C50911 Malignant neoplasm of unspecified site of right female breast: Secondary | ICD-10-CM | POA: Diagnosis not present

## 2015-04-22 DIAGNOSIS — N189 Chronic kidney disease, unspecified: Secondary | ICD-10-CM | POA: Diagnosis not present

## 2015-04-23 ENCOUNTER — Ambulatory Visit
Admission: RE | Admit: 2015-04-23 | Discharge: 2015-04-23 | Disposition: A | Discharge status: Home / Self Care | Payer: Medicare Other | Source: Ambulatory Visit | Attending: Radiation Oncology | Admitting: Radiation Oncology

## 2015-04-23 DIAGNOSIS — C50411 Malignant neoplasm of upper-outer quadrant of right female breast: Secondary | ICD-10-CM | POA: Diagnosis not present

## 2015-04-23 DIAGNOSIS — I129 Hypertensive chronic kidney disease with stage 1 through stage 4 chronic kidney disease, or unspecified chronic kidney disease: Secondary | ICD-10-CM | POA: Diagnosis not present

## 2015-04-23 DIAGNOSIS — G8222 Paraplegia, incomplete: Secondary | ICD-10-CM | POA: Diagnosis not present

## 2015-04-23 DIAGNOSIS — C50911 Malignant neoplasm of unspecified site of right female breast: Secondary | ICD-10-CM | POA: Diagnosis not present

## 2015-04-23 DIAGNOSIS — N189 Chronic kidney disease, unspecified: Secondary | ICD-10-CM | POA: Diagnosis not present

## 2015-04-23 DIAGNOSIS — M8458XD Pathological fracture in neoplastic disease, other specified site, subsequent encounter for fracture with routine healing: Secondary | ICD-10-CM | POA: Diagnosis not present

## 2015-04-23 DIAGNOSIS — C7951 Secondary malignant neoplasm of bone: Secondary | ICD-10-CM | POA: Diagnosis not present

## 2015-04-23 DIAGNOSIS — Z510 Encounter for antineoplastic radiation therapy: Secondary | ICD-10-CM | POA: Diagnosis not present

## 2015-04-23 DIAGNOSIS — E785 Hyperlipidemia, unspecified: Secondary | ICD-10-CM | POA: Diagnosis not present

## 2015-04-23 DIAGNOSIS — N390 Urinary tract infection, site not specified: Secondary | ICD-10-CM | POA: Diagnosis not present

## 2015-04-23 DIAGNOSIS — I10 Essential (primary) hypertension: Secondary | ICD-10-CM | POA: Diagnosis not present

## 2015-04-24 ENCOUNTER — Ambulatory Visit
Admission: RE | Admit: 2015-04-24 | Discharge: 2015-04-24 | Disposition: A | Discharge status: Home / Self Care | Payer: Medicare Other | Source: Ambulatory Visit | Attending: Radiation Oncology | Admitting: Radiation Oncology

## 2015-04-24 ENCOUNTER — Ambulatory Visit: Payer: Medicare Other

## 2015-04-24 ENCOUNTER — Telehealth: Payer: Self-pay | Admitting: *Deleted

## 2015-04-24 VITALS — BP 114/54 | HR 98 | Resp 16

## 2015-04-24 DIAGNOSIS — N390 Urinary tract infection, site not specified: Secondary | ICD-10-CM | POA: Diagnosis not present

## 2015-04-24 DIAGNOSIS — C7951 Secondary malignant neoplasm of bone: Secondary | ICD-10-CM

## 2015-04-24 DIAGNOSIS — N189 Chronic kidney disease, unspecified: Secondary | ICD-10-CM | POA: Diagnosis not present

## 2015-04-24 DIAGNOSIS — G8222 Paraplegia, incomplete: Secondary | ICD-10-CM | POA: Diagnosis not present

## 2015-04-24 DIAGNOSIS — C50911 Malignant neoplasm of unspecified site of right female breast: Secondary | ICD-10-CM | POA: Diagnosis not present

## 2015-04-24 DIAGNOSIS — M8458XD Pathological fracture in neoplastic disease, other specified site, subsequent encounter for fracture with routine healing: Secondary | ICD-10-CM | POA: Diagnosis not present

## 2015-04-24 DIAGNOSIS — I10 Essential (primary) hypertension: Secondary | ICD-10-CM | POA: Diagnosis not present

## 2015-04-24 DIAGNOSIS — Z510 Encounter for antineoplastic radiation therapy: Secondary | ICD-10-CM | POA: Diagnosis not present

## 2015-04-24 DIAGNOSIS — I129 Hypertensive chronic kidney disease with stage 1 through stage 4 chronic kidney disease, or unspecified chronic kidney disease: Secondary | ICD-10-CM | POA: Diagnosis not present

## 2015-04-24 DIAGNOSIS — E785 Hyperlipidemia, unspecified: Secondary | ICD-10-CM | POA: Diagnosis not present

## 2015-04-24 DIAGNOSIS — C50411 Malignant neoplasm of upper-outer quadrant of right female breast: Secondary | ICD-10-CM | POA: Diagnosis not present

## 2015-04-24 NOTE — Telephone Encounter (Signed)
[facility] is calling to get verbal order for visits 1 wk 9 for medical and medication management.Marland Kitchen  Approval given.

## 2015-04-24 NOTE — Progress Notes (Signed)
Vitals stable. Reports pain in her thoracic spine is a 3 on a scale of 0-10. Additionally patient reports pain in both feet related to effects of neuropathy. Reports taking Zantac and Tums to manage neuropathy. Denies taking decadron. Edema of lower extremities present but, no worse than compared to prior evaluation. Numbness from the waist down continues. Patient unable to stand for weight.  BP 114/54 mmHg  Pulse 98  Resp 16  Wt   SpO2 100% Wt Readings from Last 3 Encounters:  04/09/15 131 lb (59.421 kg)  03/27/15 131 lb (59.421 kg)  03/19/15 131 lb (59.421 kg)

## 2015-04-24 NOTE — Progress Notes (Signed)
  Radiation Oncology         [phone]     Name: Heather [name] MRN: [id number]   Date: 04/24/2015  DOB: 07-25-1945   Weekly Radiation Therapy Management    ICD-9-CM ICD-10-CM   1. Metastatic cancer to spine (HCC) 198.5 C79.51     Current Dose: 22 Gy  Planned Dose:  55 Gy  Narrative The patient presents for routine under treatment assessment.  Vitals stable. Reports pain in her thoracic spine is a 3 on a scale of 0-10. Additionally patient reports pain in both feet related to effects of neuropathy. Reports taking Zantac and Tums to manage neuropathy. Denies taking decadron. Edema of lower extremities present but, no worse than compared to prior evaluation. Numbness from the waist down continues. Patient unable to stand for weight.  The patient is without complaint. Set-up films were reviewed. The chart was checked.  Physical Findings  blood pressure is 114/54 and her pulse is 98. Her respiration is 16 and oxygen saturation is 100%. . Weight essentially stable.  No significant changes.  Impression The patient is tolerating radiation.  Plan Continue treatment as planned.         Sheral Apley [name], M.D.  This document serves as a record of services personally performed by Tyler Pita, MD. It was created on his behalf by Lendon Collar, a trained medical scribe. The creation of this record is based on the scribe's personal observations and the provider's statements to them. This document has been checked and approved by the attending provider.

## 2015-04-27 ENCOUNTER — Ambulatory Visit
Admission: RE | Admit: 2015-04-27 | Discharge: 2015-04-27 | Disposition: A | Discharge status: Home / Self Care | Payer: Medicare Other | Source: Ambulatory Visit | Attending: Radiation Oncology | Admitting: Radiation Oncology

## 2015-04-27 DIAGNOSIS — C50411 Malignant neoplasm of upper-outer quadrant of right female breast: Secondary | ICD-10-CM | POA: Diagnosis not present

## 2015-04-27 DIAGNOSIS — I129 Hypertensive chronic kidney disease with stage 1 through stage 4 chronic kidney disease, or unspecified chronic kidney disease: Secondary | ICD-10-CM | POA: Diagnosis not present

## 2015-04-27 DIAGNOSIS — C50911 Malignant neoplasm of unspecified site of right female breast: Secondary | ICD-10-CM | POA: Diagnosis not present

## 2015-04-27 DIAGNOSIS — Z510 Encounter for antineoplastic radiation therapy: Secondary | ICD-10-CM | POA: Diagnosis not present

## 2015-04-27 DIAGNOSIS — M8458XD Pathological fracture in neoplastic disease, other specified site, subsequent encounter for fracture with routine healing: Secondary | ICD-10-CM | POA: Diagnosis not present

## 2015-04-27 DIAGNOSIS — E785 Hyperlipidemia, unspecified: Secondary | ICD-10-CM | POA: Diagnosis not present

## 2015-04-27 DIAGNOSIS — N189 Chronic kidney disease, unspecified: Secondary | ICD-10-CM | POA: Diagnosis not present

## 2015-04-27 DIAGNOSIS — G8222 Paraplegia, incomplete: Secondary | ICD-10-CM | POA: Diagnosis not present

## 2015-04-27 DIAGNOSIS — C7951 Secondary malignant neoplasm of bone: Secondary | ICD-10-CM | POA: Diagnosis not present

## 2015-04-27 DIAGNOSIS — I10 Essential (primary) hypertension: Secondary | ICD-10-CM | POA: Diagnosis not present

## 2015-04-27 DIAGNOSIS — N390 Urinary tract infection, site not specified: Secondary | ICD-10-CM | POA: Diagnosis not present

## 2015-04-28 ENCOUNTER — Ambulatory Visit
Admission: RE | Admit: 2015-04-28 | Discharge: 2015-04-28 | Disposition: A | Discharge status: Home / Self Care | Payer: Medicare Other | Source: Ambulatory Visit | Attending: Radiation Oncology | Admitting: Radiation Oncology

## 2015-04-28 DIAGNOSIS — M8458XD Pathological fracture in neoplastic disease, other specified site, subsequent encounter for fracture with routine healing: Secondary | ICD-10-CM | POA: Diagnosis not present

## 2015-04-28 DIAGNOSIS — I129 Hypertensive chronic kidney disease with stage 1 through stage 4 chronic kidney disease, or unspecified chronic kidney disease: Secondary | ICD-10-CM | POA: Diagnosis not present

## 2015-04-28 DIAGNOSIS — Z510 Encounter for antineoplastic radiation therapy: Secondary | ICD-10-CM | POA: Diagnosis not present

## 2015-04-28 DIAGNOSIS — N390 Urinary tract infection, site not specified: Secondary | ICD-10-CM | POA: Diagnosis not present

## 2015-04-28 DIAGNOSIS — E785 Hyperlipidemia, unspecified: Secondary | ICD-10-CM | POA: Diagnosis not present

## 2015-04-28 DIAGNOSIS — N189 Chronic kidney disease, unspecified: Secondary | ICD-10-CM | POA: Diagnosis not present

## 2015-04-28 DIAGNOSIS — G8222 Paraplegia, incomplete: Secondary | ICD-10-CM | POA: Diagnosis not present

## 2015-04-28 DIAGNOSIS — C7951 Secondary malignant neoplasm of bone: Secondary | ICD-10-CM | POA: Diagnosis not present

## 2015-04-28 DIAGNOSIS — I10 Essential (primary) hypertension: Secondary | ICD-10-CM | POA: Diagnosis not present

## 2015-04-28 DIAGNOSIS — C50911 Malignant neoplasm of unspecified site of right female breast: Secondary | ICD-10-CM | POA: Diagnosis not present

## 2015-04-28 DIAGNOSIS — C50411 Malignant neoplasm of upper-outer quadrant of right female breast: Secondary | ICD-10-CM | POA: Diagnosis not present

## 2015-04-29 ENCOUNTER — Encounter: Payer: Self-pay | Admitting: Radiation Oncology

## 2015-04-29 ENCOUNTER — Ambulatory Visit
Admission: RE | Admit: 2015-04-29 | Discharge: 2015-04-29 | Disposition: A | Discharge status: Home / Self Care | Payer: Medicare Other | Source: Ambulatory Visit | Attending: Radiation Oncology | Admitting: Radiation Oncology

## 2015-04-29 VITALS — BP 114/62 | HR 89 | Resp 16

## 2015-04-29 DIAGNOSIS — I129 Hypertensive chronic kidney disease with stage 1 through stage 4 chronic kidney disease, or unspecified chronic kidney disease: Secondary | ICD-10-CM | POA: Diagnosis not present

## 2015-04-29 DIAGNOSIS — E785 Hyperlipidemia, unspecified: Secondary | ICD-10-CM | POA: Diagnosis not present

## 2015-04-29 DIAGNOSIS — C50919 Malignant neoplasm of unspecified site of unspecified female breast: Secondary | ICD-10-CM

## 2015-04-29 DIAGNOSIS — C7951 Secondary malignant neoplasm of bone: Secondary | ICD-10-CM | POA: Diagnosis not present

## 2015-04-29 DIAGNOSIS — C50911 Malignant neoplasm of unspecified site of right female breast: Secondary | ICD-10-CM | POA: Diagnosis not present

## 2015-04-29 DIAGNOSIS — Z510 Encounter for antineoplastic radiation therapy: Secondary | ICD-10-CM | POA: Diagnosis not present

## 2015-04-29 DIAGNOSIS — N390 Urinary tract infection, site not specified: Secondary | ICD-10-CM | POA: Diagnosis not present

## 2015-04-29 DIAGNOSIS — G8222 Paraplegia, incomplete: Secondary | ICD-10-CM | POA: Diagnosis not present

## 2015-04-29 DIAGNOSIS — M8458XD Pathological fracture in neoplastic disease, other specified site, subsequent encounter for fracture with routine healing: Secondary | ICD-10-CM | POA: Diagnosis not present

## 2015-04-29 DIAGNOSIS — I10 Essential (primary) hypertension: Secondary | ICD-10-CM | POA: Diagnosis not present

## 2015-04-29 DIAGNOSIS — N189 Chronic kidney disease, unspecified: Secondary | ICD-10-CM | POA: Diagnosis not present

## 2015-04-29 DIAGNOSIS — C50411 Malignant neoplasm of upper-outer quadrant of right female breast: Secondary | ICD-10-CM | POA: Diagnosis not present

## 2015-04-29 MED ORDER — RADIAPLEXRX EX GEL
Freq: Once | CUTANEOUS | Status: AC
  Administered 2015-04-29: 16:00:00 via TOPICAL

## 2015-04-29 MED ORDER — SUCRALFATE 1 G PO TABS: 1.0000 g | ORAL_TABLET | Freq: Four times a day (QID) | ORAL | Status: DC

## 2015-04-29 NOTE — Progress Notes (Signed)
Department of Radiation Oncology  Phone:  [phone] Fax:        [phone]  Weekly Treatment Note    Name: Heather [name] Date: 04/29/2015 MRN: [id number] DOB: 03-28-1946   Diagnosis:     ICD-9-CM ICD-10-CM   1. Breast cancer metastasized to bone, unspecified laterality (HCC) 174.9 C50.919    198.5 C79.51      Current dose: 28.6 Gy  Current fraction: 13   MEDICATIONS: Current Outpatient Prescriptions  Medication Sig Dispense Refill  . apixaban (ELIQUIS) 2.5 MG TABS tablet Take 1 tablet (2.5 mg total) by mouth 2 (two) times daily. 180 tablet 0  . b complex vitamins tablet Take 1 tablet by mouth daily.    . Calcium Carbonate Antacid (TUMS PO) Take 2 tablets by mouth 2 (two) times daily as needed (acid reflux).    . Calcium Carbonate-Vitamin D (CALCIUM-D PO) Take 1 tablet by mouth daily.    . Cholecalciferol (VITAMIN D) 2000 UNITS tablet Take 2,000 Units by mouth daily.    . Coenzyme Q10 (COQ10) 100 MG CAPS Take 100 mg by mouth at bedtime.    . gabapentin (NEURONTIN) 400 MG capsule Take 1 capsule (400 mg total) by mouth 3 (three) times daily. 90 capsule 1  . lidocaine-prilocaine (EMLA) cream Apply to affected area once (Patient taking differently: Apply 1 application topically every 21 ( twenty-one) days. Apply to port prior to infusions) 30 g 3  . methocarbamol (ROBAXIN) 500 MG tablet Take 1 tablet (500 mg total) by mouth daily as needed for muscle spasms. 75 tablet 0  . OVER THE COUNTER MEDICATION Place 1 drop into both eyes 2 (two) times daily as needed (dry eyes). Over the counter lubricating eye drops    . polyethylene glycol (MIRALAX / GLYCOLAX) packet Take 17 g by mouth 2 (two) times daily. (Patient taking differently: Take 8.5 g by mouth daily as needed (constipation). ) 100 each 0  . pravastatin (PRAVACHOL) 10 MG tablet Take 10 mg by mouth at bedtime.     . Probiotic Product (PROBIOTIC DAILY PO) Take 1 capsule by mouth 2 (two) times daily.     . ranitidine  (ZANTAC) 300 MG tablet Take 300 mg by mouth at bedtime.     Marland Kitchen spironolactone (ALDACTONE) 25 MG tablet Take 0.5 tablets (12.5 mg total) by mouth daily. 45 tablet 12  . traMADol (ULTRAM) 50 MG tablet Take 1 tablet (50 mg total) by mouth every 6 (six) hours as needed for moderate pain. 60 tablet 0  . dexamethasone (DECADRON) 1 MG tablet Take 1 tablet (1 mg total) by mouth 2 (two) times daily with a meal. '2mg'$  PO BID x 5 days then '1mg'$  PO BID x 5 days, then off. (Patient not taking: Reported on 04/17/2015) 10 tablet 0   No current facility-administered medications for this encounter.     ALLERGIES: Codeine; Keflex; Naprosyn; Oxycodone; and Tussin   LABORATORY DATA:  Lab Results  Component Value Date   WBC 5.8 04/10/2015   HGB 9.6* 04/10/2015   HCT 30.8* 04/10/2015   MCV 96.0 04/10/2015   PLT 195 04/10/2015   Lab Results  Component Value Date   NA 136 04/10/2015   K 3.7 04/10/2015   CL 99* 03/28/2015   CO2 30* 04/10/2015   Lab Results  Component Value Date   ALT 53 04/10/2015   AST 27 04/10/2015   ALKPHOS 110 04/10/2015   BILITOT 0.58 04/10/2015     NARRATIVE: Heather [name] was seen today  for weekly treatment management. The chart was checked and the patient's films were reviewed.  Vitals stable. Reports pain in her thoracic spine 3 on a scale of 0-10. Denies taking decadron. Reports new onset of difficulty and pain associated with swallowing. Requesting medication to relieve throat pain. Faint hyperpigmentation and dryness within treatment field. Provided patient with radiaplex and directed upon use. Numbness from the waist down continues. Patient unable to stand for weight.   BP 114/62 mmHg  Pulse 89  Resp 16  Wt   SpO2 100% Wt Readings from Last 3 Encounters:  04/09/15 131 lb (59.421 kg)  03/27/15 131 lb (59.421 kg)  03/19/15 131 lb (59.421 kg)     PHYSICAL EXAMINATION: blood pressure is 114/62 and her pulse is 89. Her respiration is 16 and oxygen saturation is 100%.         ASSESSMENT: The patient is doing satisfactorily with treatment. The patient is experiencing some esophagitis She states this is most prominent at meals.  PLAN: We will continue with the patient's radiation treatment as planned. He patient was given a prescription for Carafate. She will continue her radiation treatment as planned.

## 2015-04-29 NOTE — Addendum Note (Signed)
Encounter addended by: Heywood Footman, RN on: 04/29/2015  4:07 PM<BR>     Documentation filed: Medications, Visit Diagnoses, Dx Association, Inpatient MAR, Orders

## 2015-04-29 NOTE — Progress Notes (Signed)
Vitals stable. Reports pain in her thoracic spine 3 on a scale of 0-10. Denies taking decadron. Reports new onset of difficulty and pain associated with swallowing. Requesting medication to relieve throat pain. Faint hyperpigmentation and dryness within treatment field. Provided patient with radiaplex and directed upon use. Numbness from the waist down continues. Patient unable to stand for weight.   BP 114/62 mmHg  Pulse 89  Resp 16  Wt   SpO2 100% Wt Readings from Last 3 Encounters:  04/09/15 131 lb (59.421 kg)  03/27/15 131 lb (59.421 kg)  03/19/15 131 lb (59.421 kg)

## 2015-04-30 ENCOUNTER — Other Ambulatory Visit: Payer: Medicare Other

## 2015-04-30 ENCOUNTER — Ambulatory Visit: Payer: Medicare Other

## 2015-04-30 ENCOUNTER — Other Ambulatory Visit (HOSPITAL_BASED_OUTPATIENT_CLINIC_OR_DEPARTMENT_OTHER): Payer: Medicare Other

## 2015-04-30 ENCOUNTER — Telehealth: Payer: Self-pay | Admitting: Hematology and Oncology

## 2015-04-30 ENCOUNTER — Ambulatory Visit
Admission: RE | Admit: 2015-04-30 | Discharge: 2015-04-30 | Disposition: A | Discharge status: Home / Self Care | Payer: Medicare Other | Source: Ambulatory Visit | Attending: Radiation Oncology | Admitting: Radiation Oncology

## 2015-04-30 ENCOUNTER — Encounter: Payer: Self-pay | Admitting: Hematology and Oncology

## 2015-04-30 ENCOUNTER — Ambulatory Visit (HOSPITAL_BASED_OUTPATIENT_CLINIC_OR_DEPARTMENT_OTHER): Payer: Medicare Other | Admitting: Hematology and Oncology

## 2015-04-30 ENCOUNTER — Ambulatory Visit (HOSPITAL_BASED_OUTPATIENT_CLINIC_OR_DEPARTMENT_OTHER): Payer: Medicare Other

## 2015-04-30 VITALS — BP 105/65 | HR 86 | Temp 98.5°F | Resp 18

## 2015-04-30 DIAGNOSIS — C50411 Malignant neoplasm of upper-outer quadrant of right female breast: Secondary | ICD-10-CM

## 2015-04-30 DIAGNOSIS — I10 Essential (primary) hypertension: Secondary | ICD-10-CM | POA: Diagnosis not present

## 2015-04-30 DIAGNOSIS — E785 Hyperlipidemia, unspecified: Secondary | ICD-10-CM | POA: Diagnosis not present

## 2015-04-30 DIAGNOSIS — N189 Chronic kidney disease, unspecified: Secondary | ICD-10-CM | POA: Diagnosis not present

## 2015-04-30 DIAGNOSIS — C50911 Malignant neoplasm of unspecified site of right female breast: Secondary | ICD-10-CM

## 2015-04-30 DIAGNOSIS — G8220 Paraplegia, unspecified: Secondary | ICD-10-CM

## 2015-04-30 DIAGNOSIS — Z170 Estrogen receptor positive status [ER+]: Secondary | ICD-10-CM

## 2015-04-30 DIAGNOSIS — Z5112 Encounter for antineoplastic immunotherapy: Secondary | ICD-10-CM

## 2015-04-30 DIAGNOSIS — C7951 Secondary malignant neoplasm of bone: Secondary | ICD-10-CM

## 2015-04-30 DIAGNOSIS — C773 Secondary and unspecified malignant neoplasm of axilla and upper limb lymph nodes: Secondary | ICD-10-CM | POA: Diagnosis not present

## 2015-04-30 DIAGNOSIS — Z510 Encounter for antineoplastic radiation therapy: Secondary | ICD-10-CM | POA: Diagnosis not present

## 2015-04-30 DIAGNOSIS — Z95828 Presence of other vascular implants and grafts: Secondary | ICD-10-CM

## 2015-04-30 LAB — CBC WITH DIFFERENTIAL/PLATELET
BASO%: 0.8 % (ref 0.0–2.0)
Basophils Absolute: 0 10*3/uL (ref 0.0–0.1)
EOS%: 1.1 % (ref 0.0–7.0)
Eosinophils Absolute: 0.1 10*3/uL (ref 0.0–0.5)
HCT: 26.8 % — ABNORMAL LOW (ref 34.8–46.6)
HGB: 8.3 g/dL — ABNORMAL LOW (ref 11.6–15.9)
LYMPH%: 13.3 % — ABNORMAL LOW (ref 14.0–49.7)
MCH: 27.5 pg (ref 25.1–34.0)
MCHC: 31 g/dL — ABNORMAL LOW (ref 31.5–36.0)
MCV: 88.6 fL (ref 79.5–101.0)
MONO#: 0.5 10*3/uL (ref 0.1–0.9)
MONO%: 10.8 % (ref 0.0–14.0)
NEUT#: 3.8 10*3/uL (ref 1.5–6.5)
NEUT%: 74 % (ref 38.4–76.8)
Platelets: 487 10*3/uL — ABNORMAL HIGH (ref 145–400)
RBC: 3.03 10*6/uL — ABNORMAL LOW (ref 3.70–5.45)
RDW: 18.7 % — ABNORMAL HIGH (ref 11.2–14.5)
WBC: 5.1 10*3/uL (ref 3.9–10.3)
lymph#: 0.7 10*3/uL — ABNORMAL LOW (ref 0.9–3.3)

## 2015-04-30 LAB — COMPREHENSIVE METABOLIC PANEL
ALT: 14 U/L (ref 0–55)
AST: 19 U/L (ref 5–34)
Albumin: 2.6 g/dL — ABNORMAL LOW (ref 3.5–5.0)
Alkaline Phosphatase: 98 U/L (ref 40–150)
Anion Gap: 9 mEq/L (ref 3–11)
BUN: 13.1 mg/dL (ref 7.0–26.0)
CO2: 28 mEq/L (ref 22–29)
Calcium: 9.2 mg/dL (ref 8.4–10.4)
Chloride: 100 mEq/L (ref 98–109)
Creatinine: 0.8 mg/dL (ref 0.6–1.1)
EGFR: 78 mL/min/{1.73_m2} — ABNORMAL LOW (ref >90)
Glucose: 116 mg/dl (ref 70–140)
Potassium: 4 mEq/L (ref 3.5–5.1)
Sodium: 138 mEq/L (ref 136–145)
Total Bilirubin: 0.34 mg/dL (ref 0.20–1.20)
Total Protein: 6 g/dL — ABNORMAL LOW (ref 6.4–8.3)

## 2015-04-30 MED ORDER — TRASTUZUMAB CHEMO INJECTION 440 MG
6.0000 mg/kg | Freq: Once | INTRAVENOUS | Status: AC
  Administered 2015-04-30: 357 mg via INTRAVENOUS
  Filled 2015-04-30: qty 17

## 2015-04-30 MED ORDER — ACETAMINOPHEN 325 MG PO TABS
650.0000 mg | ORAL_TABLET | Freq: Once | ORAL | Status: AC
  Administered 2015-04-30: 650 mg via ORAL

## 2015-04-30 MED ORDER — ACETAMINOPHEN 325 MG PO TABS
ORAL_TABLET | ORAL | Status: AC
  Filled 2015-04-30: qty 2

## 2015-04-30 MED ORDER — SODIUM CHLORIDE 0.9 % IV SOLN
Freq: Once | INTRAVENOUS | Status: AC
  Administered 2015-04-30: 13:00:00 via INTRAVENOUS

## 2015-04-30 MED ORDER — ZOLEDRONIC ACID 4 MG/100ML IV SOLN
4.0000 mg | Freq: Once | INTRAVENOUS | Status: AC
  Administered 2015-04-30: 4 mg via INTRAVENOUS
  Filled 2015-04-30: qty 100

## 2015-04-30 MED ORDER — SODIUM CHLORIDE 0.9 % IJ SOLN
10.0000 mL | INTRAMUSCULAR | Status: DC | PRN
  Administered 2015-04-30: 10 mL
  Filled 2015-04-30: qty 10

## 2015-04-30 MED ORDER — HEPARIN SOD (PORK) LOCK FLUSH 100 UNIT/ML IV SOLN
500.0000 [IU] | Freq: Once | INTRAVENOUS | Status: AC | PRN
  Administered 2015-04-30: 500 [IU]
  Filled 2015-04-30: qty 5

## 2015-04-30 MED ORDER — DIPHENHYDRAMINE HCL 25 MG PO CAPS
ORAL_CAPSULE | ORAL | Status: AC
  Filled 2015-04-30: qty 1

## 2015-04-30 MED ORDER — DIPHENHYDRAMINE HCL 25 MG PO CAPS
25.0000 mg | ORAL_CAPSULE | Freq: Once | ORAL | Status: AC
  Administered 2015-04-30: 25 mg via ORAL

## 2015-04-30 MED ORDER — SODIUM CHLORIDE 0.9 % IJ SOLN
10.0000 mL | INTRAMUSCULAR | Status: DC | PRN
  Administered 2015-04-30: 10 mL via INTRAVENOUS
  Filled 2015-04-30: qty 10

## 2015-04-30 NOTE — Patient Instructions (Signed)

## 2015-04-30 NOTE — Telephone Encounter (Signed)
Appointments made and avs printed for patient °

## 2015-04-30 NOTE — Progress Notes (Signed)
Clarified orders with [name], pt is to also receive Zometa today.

## 2015-04-30 NOTE — Assessment & Plan Note (Signed)
Right breast invasive ductal carcinoma ER positive PR negative HER-2 positive Ki-67 44% multifocal disease 3/7 lymph nodes positive T2 N1 M0 stage IIB status post adjuvant chemotherapy with TCH followed by Herceptin maintenance, adjuvant radiation therapy, currently on letrozole since 02/07/2013. MRI 02/14/2015: T5 large destructive lesion with pathologic compression fracture and extensive epidural tumor, involvement of T4, excision metastatic carcinoma ER 60%, PR 0%, HER-2 positive ratio 2.71 average copy #7.45 PET CT 03/18/2015:Residual enhancing soft tissue adjacent to the spinal cord. No evidence of metastatic disease. Nonspecific uptake the left nipple. 03/27/2015: T4-6 decompression surgery for spinal cord compression  Recommendation: 1. Radiation therapy to the spine (Dr. [name]) started 04/13/2015 2. Hold anastrozole (until radiation is complete) 3. Herceptin every 3 week started 04/10/2015 4. Obtain echocardiograms every 3 months  Bone metastases: Patient will receive Zometa every 3 weeks with calcium and vitamin D  Goal of treatment: Palliation  Return to clinic in 6 weeks  And every 3 weeks for Herceptin

## 2015-04-30 NOTE — Progress Notes (Signed)
Patient Care Team: [name], MD as PCP - General (Family Medicine)  DIAGNOSIS: Primary cancer of upper outer quadrant of right female breast [facility])   Staging form: Breast, AJCC 7th Edition     Clinical: Stage IIB (T2, N1, cM0) - Unsigned       Staging comments: Staged at breast conference 11.6.13      Pathologic: No stage assigned - Unsigned   SUMMARY OF ONCOLOGIC HISTORY:   Primary cancer of upper outer quadrant of right female breast ([place])   03/08/2012 Initial Diagnosis Right breast invasive ductal carcinoma ER positive PR negative HER-2 positive Ki-67 44%; another breast mass biopsied in the anterior part of the breast which was also positive for malignancy that was HER-2 negative   05/10/2012 Surgery Right mastectomy and axillary lymph node dissection: Multifocal disease 5 cm, 1.7 cm, 1.6 cm, ER positive PR negative HER-2 positive Ki-67 44%, 3/7 lymph nodes positive   06/14/2012 - 06/12/2013 Chemotherapy Adjuvant chemotherapy with [place] 6 followed by Herceptin maintenance   10/25/2012 - 12/11/2012 Radiation Therapy Adjuvant radiation therapy   02/07/2013 -  Anti-estrogen oral therapy Letrozole 2.5 mg daily   02/14/2015 Imaging MRI spine: Large destructive T5 lesion with severe pathologic compression fracture and extensive epidural tumor, severe spinal stenosis with moderate cord compression, tumor involvement of T4   03/18/2015 PET scan Residual enhancing soft tissue adjacent to the spinal cord. No evidence of metastatic disease. Nonspecific uptake the left nipple   03/27/2015 - 03/30/2015 Hospital Admission T4-6 decompression for spinal cord compression (lower extremity paralysis)   04/10/2015 -  Chemotherapy Palliative treatment with Herceptin every 3 weeks    CHIEF COMPLIANT: Follow-up on Herceptin  INTERVAL HISTORY: Heather [name] is a 69 year old with above-mentioned history of metastatic breast cancer with spinal cord compression. She is undergoing palliative radiation therapy. She is  also getting Herceptin every 3 weeks. She is tolerating Herceptin extremely well. She is working with physical therapy and occupation therapy and appears to be getting stronger. She does not have much of an appetite and hence has not been eating very well.  REVIEW OF SYSTEMS:   Constitutional: Denies fevers, chills or abnormal weight loss Eyes: Denies blurriness of vision Ears, nose, mouth, throat, and face: Denies mucositis or sore throat Respiratory: Denies cough, dyspnea or wheezes Cardiovascular: Denies palpitation, chest discomfort Gastrointestinal:  Denies nausea, heartburn or change in bowel habits Skin: Denies abnormal skin rashes Lymphatics: Denies new lymphadenopathy or easy bruising Neurological: Bilateral lower extremity paralysis Behavioral/Psych: Mood is stable, no new changes  Extremities: No lower extremity edema  All other systems were reviewed with the patient and are negative.  I have reviewed the past medical history, past surgical history, social history and family history with the patient and they are unchanged from previous note.  ALLERGIES:  is allergic to codeine; keflex; naprosyn; oxycodone; and tussin.  MEDICATIONS:  Current Outpatient Prescriptions  Medication Sig Dispense Refill  . apixaban (ELIQUIS) 2.5 MG TABS tablet Take 1 tablet (2.5 mg total) by mouth 2 (two) times daily. 180 tablet 0  . b complex vitamins tablet Take 1 tablet by mouth daily.    . Calcium Carbonate Antacid (TUMS PO) Take 2 tablets by mouth 2 (two) times daily as needed (acid reflux).    . Calcium Carbonate-Vitamin D (CALCIUM-D PO) Take 1 tablet by mouth daily.    . Cholecalciferol (VITAMIN D) 2000 UNITS tablet Take 2,000 Units by mouth daily.    . Coenzyme Q10 (COQ10) 100 MG CAPS Take  100 mg by mouth at bedtime.    Marland Kitchen dexamethasone (DECADRON) 1 MG tablet Take 1 tablet (1 mg total) by mouth 2 (two) times daily with a meal. 3m PO BID x 5 days then 116mPO BID x 5 days, then off. (Patient  not taking: Reported on 04/17/2015) 10 tablet 0  . gabapentin (NEURONTIN) 400 MG capsule Take 1 capsule (400 mg total) by mouth 3 (three) times daily. 90 capsule 1  . lidocaine-prilocaine (EMLA) cream Apply to affected area once (Patient taking differently: Apply 1 application topically every 21 ( twenty-one) days. Apply to port prior to infusions) 30 g 3  . methocarbamol (ROBAXIN) 500 MG tablet Take 1 tablet (500 mg total) by mouth daily as needed for muscle spasms. 75 tablet 0  . OVER THE COUNTER MEDICATION Place 1 drop into both eyes 2 (two) times daily as needed (dry eyes). Over the counter lubricating eye drops    . polyethylene glycol (MIRALAX / GLYCOLAX) packet Take 17 g by mouth 2 (two) times daily. (Patient taking differently: Take 8.5 g by mouth daily as needed (constipation). ) 100 each 0  . pravastatin (PRAVACHOL) 10 MG tablet Take 10 mg by mouth at bedtime.     . Probiotic Product (PROBIOTIC DAILY PO) Take 1 capsule by mouth 2 (two) times daily.     . ranitidine (ZANTAC) 300 MG tablet Take 300 mg by mouth at bedtime.     . Marland Kitchenpironolactone (ALDACTONE) 25 MG tablet Take 0.5 tablets (12.5 mg total) by mouth daily. 45 tablet 12  . sucralfate (CARAFATE) 1 G tablet Take 1 tablet (1 g total) by mouth 4 (four) times daily. 120 tablet 2  . traMADol (ULTRAM) 50 MG tablet Take 1 tablet (50 mg total) by mouth every 6 (six) hours as needed for moderate pain. 60 tablet 0  . Wound Cleansers (RADIAPLEX EX) Apply topically.     No current facility-administered medications for this visit.    PHYSICAL EXAMINATION: ECOG PERFORMANCE STATUS: 2 - Symptomatic, <50% confined to bed  Filed Vitals:   04/30/15 1030  BP: 105/65  Pulse: 86  Temp: 98.5 F (36.9 C)  Resp: 18   Filed Weights    GENERAL:alert, no distress and comfortable SKIN: skin color, texture, turgor are normal, no rashes or significant lesions EYES: normal, Conjunctiva are pink and non-injected, sclera clear OROPHARYNX:no exudate, no  erythema and lips, buccal mucosa, and tongue normal  NECK: supple, thyroid normal size, non-tender, without nodularity LYMPH:  no palpable lymphadenopathy in the cervical, axillary or inguinal LUNGS: clear to auscultation and percussion with normal breathing effort HEART: regular rate & rhythm and no murmurs and no lower extremity edema ABDOMEN:abdomen soft, non-tender and normal bowel sounds MUSCULOSKELETAL:no cyanosis of digits and no clubbing  NEURO: alert & oriented x 3 with fluent speech; lower extremity paralysis as well as sensory deficits EXTREMITIES: No lower extremity edema   LABORATORY DATA:  I have reviewed the data as listed   Chemistry      Component Value Date/Time   NA 138 04/30/2015 1000   NA 135 03/28/2015 0446   K 4.0 04/30/2015 1000   K 3.8 03/28/2015 0446   CL 99* 03/28/2015 0446   CL 101 10/25/2012 0946   CO2 28 04/30/2015 1000   CO2 28 03/28/2015 0446   BUN 13.1 04/30/2015 1000   BUN 24* 03/28/2015 0446   CREATININE 0.8 04/30/2015 1000   CREATININE 0.78 03/28/2015 0446      Component Value Date/Time  CALCIUM 9.2 04/30/2015 1000   CALCIUM 7.8* 03/28/2015 0446   ALKPHOS 98 04/30/2015 1000   ALKPHOS 124 03/27/2015 1434   AST 19 04/30/2015 1000   AST 42* 03/27/2015 1434   ALT 14 04/30/2015 1000   ALT 136* 03/27/2015 1434   BILITOT 0.34 04/30/2015 1000   BILITOT 0.9 03/27/2015 1434       Lab Results  Component Value Date   WBC 5.1 04/30/2015   HGB 8.3* 04/30/2015   HCT 26.8* 04/30/2015   MCV 88.6 04/30/2015   PLT 487* 04/30/2015   NEUTROABS 3.8 04/30/2015     ASSESSMENT & PLAN:  Primary cancer of upper outer quadrant of right female breast ([place]) Right breast invasive ductal carcinoma ER positive PR negative HER-2 positive Ki-67 44% multifocal disease 3/7 lymph nodes positive T2 N1 M0 stage IIB status post adjuvant chemotherapy with TCH followed by Herceptin maintenance, adjuvant radiation therapy, currently on letrozole since  02/07/2013. MRI 02/14/2015: T5 large destructive lesion with pathologic compression fracture and extensive epidural tumor, involvement of T4, excision metastatic carcinoma ER 60%, PR 0%, HER-2 positive ratio 2.71 average copy #7.45 PET CT 03/18/2015:Residual enhancing soft tissue adjacent to the spinal cord. No evidence of metastatic disease. Nonspecific uptake the left nipple. 03/27/2015: T4-6 decompression surgery for spinal cord compression  Recommendation: 1. Radiation therapy to the spine (Dr. [name]) started 04/13/2015 2. Hold anastrozole (until radiation is complete) 3. Herceptin every 3 week started 04/10/2015 4. Obtain echocardiograms every 3 months  Bone metastases: Patient will receive Zometa every 3 weeks with calcium and vitamin D Hypoproteinemia: We will request a dietitian to see her in infusion to recommend high-protein foods and protein supplements.  Goal of treatment: Palliation  Return to clinic in 6 weeks  And every 3 weeks for Herceptin   No orders of the defined types were placed in this encounter.   The patient has a good understanding of the overall plan. she agrees with it. she will call with any problems that may develop before the next visit here.   Rulon Eisenmenger, MD 04/30/2015

## 2015-04-30 NOTE — Patient Instructions (Addendum)
[place] Discharge Instructions for Patients  Today you received the following: Herceptin   To help prevent nausea and vomiting after your treatment, we encourage you to take your nausea medication as directed.    If you develop nausea and vomiting that is not controlled by your nausea medication, call the clinic.   BELOW ARE SYMPTOMS THAT SHOULD BE REPORTED IMMEDIATELY:  *FEVER GREATER THAN 100.5 F  *CHILLS WITH OR WITHOUT FEVER  NAUSEA AND VOMITING THAT IS NOT CONTROLLED WITH YOUR NAUSEA MEDICATION  *UNUSUAL SHORTNESS OF BREATH  *UNUSUAL BRUISING OR BLEEDING  TENDERNESS IN MOUTH AND THROAT WITH OR WITHOUT PRESENCE OF ULCERS  *URINARY PROBLEMS  *BOWEL PROBLEMS  UNUSUAL RASH Items with * indicate a potential emergency and should be followed up as soon as possible.  Feel free to call the clinic you have any questions or concerns. The clinic phone number is [phone].  Please show the [facility] at check-in to the Emergency Department and triage nurse.     Zoledronic Acid injection (Hypercalcemia, Oncology) What is this medicine? ZOLEDRONIC ACID (ZOE le dron ik AS id) lowers the amount of calcium loss from bone. It is used to treat too much calcium in your blood from cancer. It is also used to prevent complications of cancer that has spread to the bone. This medicine may be used for other purposes; ask your health care provider or pharmacist if you have questions. What should I tell my health care provider before I take this medicine? They need to know if you have any of these conditions: -aspirin-sensitive asthma -cancer, especially if you are receiving medicines used to treat cancer -dental disease or wear dentures -infection -kidney disease -receiving corticosteroids like dexamethasone or prednisone -an unusual or allergic reaction to zoledronic acid, other medicines, foods, dyes, or preservatives -pregnant or trying to get  pregnant -breast-feeding How should I use this medicine? This medicine is for infusion into a vein. It is given by a health care professional in a hospital or clinic setting. Talk to your pediatrician regarding the use of this medicine in children. Special care may be needed. Overdosage: If you think you have taken too much of this medicine contact a poison control center or emergency room at once. NOTE: This medicine is only for you. Do not share this medicine with others. What if I miss a dose? It is important not to miss your dose. Call your doctor or health care professional if you are unable to keep an appointment. What may interact with this medicine? -certain antibiotics given by injection -NSAIDs, medicines for pain and inflammation, like ibuprofen or naproxen -some diuretics like bumetanide, furosemide -teriparatide -thalidomide This list may not describe all possible interactions. Give your health care provider a list of all the medicines, herbs, non-prescription drugs, or dietary supplements you use. Also tell them if you smoke, drink alcohol, or use illegal drugs. Some items may interact with your medicine. What should I watch for while using this medicine? Visit your doctor or health care professional for regular checkups. It may be some time before you see the benefit from this medicine. Do not stop taking your medicine unless your doctor tells you to. Your doctor may order blood tests or other tests to see how you are doing. Women should inform their doctor if they wish to become pregnant or think they might be pregnant. There is a potential for serious side effects to an unborn child. Talk to your health care professional  or pharmacist for more information. You should make sure that you get enough calcium and vitamin D while you are taking this medicine. Discuss the foods you eat and the vitamins you take with your health care professional. Some people who take this medicine have  severe bone, joint, and/or muscle pain. This medicine may also increase your risk for jaw problems or a broken thigh bone. Tell your doctor right away if you have severe pain in your jaw, bones, joints, or muscles. Tell your doctor if you have any pain that does not go away or that gets worse. Tell your dentist and dental surgeon that you are taking this medicine. You should not have major dental surgery while on this medicine. See your dentist to have a dental exam and fix any dental problems before starting this medicine. Take good care of your teeth while on this medicine. Make sure you see your dentist for regular follow-up appointments. What side effects may I notice from receiving this medicine? Side effects that you should report to your doctor or health care professional as soon as possible: -allergic reactions like skin rash, itching or hives, swelling of the face, lips, or tongue -anxiety, confusion, or depression -breathing problems -changes in vision -eye pain -feeling faint or lightheaded, falls -jaw pain, especially after dental work -mouth sores -muscle cramps, stiffness, or weakness -redness, blistering, peeling or loosening of the skin, including inside the mouth -trouble passing urine or change in the amount of urine Side effects that usually do not require medical attention (report to your doctor or health care professional if they continue or are bothersome): -bone, joint, or muscle pain -constipation -diarrhea -fever -hair loss -irritation at site where injected -loss of appetite -nausea, vomiting -stomach upset -trouble sleeping -trouble swallowing -weak or tired This list may not describe all possible side effects. Call your doctor for medical advice about side effects. You may report side effects to FDA at 1-800-FDA-1088. Where should I keep my medicine? This drug is given in a hospital or clinic and will not be stored at home. NOTE: This sheet is a summary. It  may not cover all possible information. If you have questions about this medicine, talk to your doctor, pharmacist, or health care provider.    2016, [employer]. (2013-09-21 14:19:39)

## 2015-05-01 ENCOUNTER — Ambulatory Visit
Admission: RE | Admit: 2015-05-01 | Discharge: 2015-05-01 | Disposition: A | Discharge status: Home / Self Care | Payer: Medicare Other | Source: Ambulatory Visit | Attending: Radiation Oncology | Admitting: Radiation Oncology

## 2015-05-01 ENCOUNTER — Encounter: Payer: Self-pay | Admitting: Radiation Oncology

## 2015-05-01 ENCOUNTER — Telehealth: Payer: Self-pay | Admitting: Radiation Oncology

## 2015-05-01 DIAGNOSIS — M8458XD Pathological fracture in neoplastic disease, other specified site, subsequent encounter for fracture with routine healing: Secondary | ICD-10-CM | POA: Diagnosis not present

## 2015-05-01 DIAGNOSIS — C7951 Secondary malignant neoplasm of bone: Principal | ICD-10-CM

## 2015-05-01 DIAGNOSIS — I129 Hypertensive chronic kidney disease with stage 1 through stage 4 chronic kidney disease, or unspecified chronic kidney disease: Secondary | ICD-10-CM | POA: Diagnosis not present

## 2015-05-01 DIAGNOSIS — E785 Hyperlipidemia, unspecified: Secondary | ICD-10-CM | POA: Diagnosis not present

## 2015-05-01 DIAGNOSIS — C50911 Malignant neoplasm of unspecified site of right female breast: Secondary | ICD-10-CM

## 2015-05-01 DIAGNOSIS — C50411 Malignant neoplasm of upper-outer quadrant of right female breast: Secondary | ICD-10-CM | POA: Diagnosis not present

## 2015-05-01 DIAGNOSIS — G8222 Paraplegia, incomplete: Secondary | ICD-10-CM | POA: Diagnosis not present

## 2015-05-01 DIAGNOSIS — I10 Essential (primary) hypertension: Secondary | ICD-10-CM | POA: Diagnosis not present

## 2015-05-01 DIAGNOSIS — N390 Urinary tract infection, site not specified: Secondary | ICD-10-CM | POA: Diagnosis not present

## 2015-05-01 DIAGNOSIS — Z510 Encounter for antineoplastic radiation therapy: Secondary | ICD-10-CM | POA: Diagnosis not present

## 2015-05-01 DIAGNOSIS — N189 Chronic kidney disease, unspecified: Secondary | ICD-10-CM | POA: Diagnosis not present

## 2015-05-01 MED ORDER — PROCHLORPERAZINE MALEATE 10 MG PO TABS: 10.0000 mg | ORAL_TABLET | Freq: Four times a day (QID) | ORAL | Status: DC | PRN

## 2015-05-01 NOTE — Progress Notes (Signed)
Reports Carafate seems to help relieve pain and difficulty with swallowing. Patient reports medication such as Zofran or Compazine for intermittent episodes of nausea without vomiting.

## 2015-05-01 NOTE — Telephone Encounter (Signed)
Patient phoned requesting Carafate script clarification. Provided clarification. Patient verbalized understanding.

## 2015-05-01 NOTE — Progress Notes (Signed)
Department of Radiation Oncology  Phone:  [phone] Fax:        [phone]  Weekly Treatment Note    Name: Heather [name] Date: 05/01/2015 MRN: [id number] DOB: Oct 14, 1945   Diagnosis:     ICD-9-CM ICD-10-CM   1. Breast cancer metastasized to bone, right (HCC) 174.9 C50.911    198.5 C79.51      Current dose: 33 Gy  Current fraction: 15   MEDICATIONS: Current Outpatient Prescriptions  Medication Sig Dispense Refill  . apixaban (ELIQUIS) 2.5 MG TABS tablet Take 1 tablet (2.5 mg total) by mouth 2 (two) times daily. 180 tablet 0  . b complex vitamins tablet Take 1 tablet by mouth daily.    . Calcium Carbonate Antacid (TUMS PO) Take 2 tablets by mouth 2 (two) times daily as needed (acid reflux).    . Calcium Carbonate-Vitamin D (CALCIUM-D PO) Take 1 tablet by mouth daily.    . Cholecalciferol (VITAMIN D) 2000 UNITS tablet Take 2,000 Units by mouth daily.    . Coenzyme Q10 (COQ10) 100 MG CAPS Take 100 mg by mouth at bedtime.    Marland Kitchen dexamethasone (DECADRON) 1 MG tablet Take 1 tablet (1 mg total) by mouth 2 (two) times daily with a meal. '2mg'$  PO BID x 5 days then '1mg'$  PO BID x 5 days, then off. (Patient not taking: Reported on 04/17/2015) 10 tablet 0  . gabapentin (NEURONTIN) 400 MG capsule Take 1 capsule (400 mg total) by mouth 3 (three) times daily. 90 capsule 1  . lidocaine-prilocaine (EMLA) cream Apply to affected area once (Patient taking differently: Apply 1 application topically every 21 ( twenty-one) days. Apply to port prior to infusions) 30 g 3  . methocarbamol (ROBAXIN) 500 MG tablet Take 1 tablet (500 mg total) by mouth daily as needed for muscle spasms. 75 tablet 0  . OVER THE COUNTER MEDICATION Place 1 drop into both eyes 2 (two) times daily as needed (dry eyes). Over the counter lubricating eye drops    . polyethylene glycol (MIRALAX / GLYCOLAX) packet Take 17 g by mouth 2 (two) times daily. (Patient taking differently: Take 8.5 g by mouth daily as needed  (constipation). ) 100 each 0  . pravastatin (PRAVACHOL) 10 MG tablet Take 10 mg by mouth at bedtime.     . Probiotic Product (PROBIOTIC DAILY PO) Take 1 capsule by mouth 2 (two) times daily.     . prochlorperazine (COMPAZINE) 10 MG tablet Take 1 tablet (10 mg total) by mouth every 6 (six) hours as needed for nausea or vomiting. 30 tablet 0  . ranitidine (ZANTAC) 300 MG tablet Take 300 mg by mouth at bedtime.     Marland Kitchen spironolactone (ALDACTONE) 25 MG tablet Take 0.5 tablets (12.5 mg total) by mouth daily. 45 tablet 12  . sucralfate (CARAFATE) 1 G tablet Take 1 tablet (1 g total) by mouth 4 (four) times daily. 120 tablet 2  . traMADol (ULTRAM) 50 MG tablet Take 1 tablet (50 mg total) by mouth every 6 (six) hours as needed for moderate pain. 60 tablet 0  . Wound Cleansers (RADIAPLEX EX) Apply topically.     No current facility-administered medications for this encounter.     ALLERGIES: Codeine; Keflex; Naprosyn; Oxycodone; and Tussin   LABORATORY DATA:  Lab Results  Component Value Date   WBC 5.1 04/30/2015   HGB 8.3* 04/30/2015   HCT 26.8* 04/30/2015   MCV 88.6 04/30/2015   PLT 487* 04/30/2015   Lab Results  Component Value Date  NA 138 04/30/2015   K 4.0 04/30/2015   CL 99* 03/28/2015   CO2 28 04/30/2015   Lab Results  Component Value Date   ALT 14 04/30/2015   AST 19 04/30/2015   ALKPHOS 98 04/30/2015   BILITOT 0.34 04/30/2015     NARRATIVE: Heather [name] was seen today for weekly treatment management. The chart was checked and the patient's films were reviewed.  Reports carafate seems to help relieve pain and difficulty with swallowing. She wanted to be seen for nausea and does not have anything for it.  There were no vitals taken for this visit. Wt Readings from Last 3 Encounters:  04/09/15 131 lb (59.421 kg)  03/27/15 131 lb (59.421 kg)  03/19/15 131 lb (59.421 kg)     PHYSICAL EXAMINATION:    Alert, NAD  ASSESSMENT: The patient is doing satisfactorily with  treatment. The patient is experiencing some nausea.  PLAN: We will continue with the patient's radiation treatment as planned. She will be prescribed compazine.  This document serves as a record of services personally performed by Kyung Rudd, MD. It was created on his behalf by Darcus Austin, a trained medical scribe. The creation of this record is based on the scribe's personal observations and the provider's statements to them. This document has been checked and approved by the attending provider.

## 2015-05-05 ENCOUNTER — Ambulatory Visit
Admission: RE | Admit: 2015-05-05 | Discharge: 2015-05-05 | Disposition: A | Discharge status: Home / Self Care | Payer: Medicare Other | Source: Ambulatory Visit | Attending: Radiation Oncology | Admitting: Radiation Oncology

## 2015-05-05 ENCOUNTER — Telehealth: Payer: Self-pay | Admitting: Radiation Oncology

## 2015-05-05 DIAGNOSIS — I10 Essential (primary) hypertension: Secondary | ICD-10-CM | POA: Diagnosis not present

## 2015-05-05 DIAGNOSIS — C50411 Malignant neoplasm of upper-outer quadrant of right female breast: Secondary | ICD-10-CM | POA: Diagnosis not present

## 2015-05-05 DIAGNOSIS — E785 Hyperlipidemia, unspecified: Secondary | ICD-10-CM | POA: Diagnosis not present

## 2015-05-05 DIAGNOSIS — C50911 Malignant neoplasm of unspecified site of right female breast: Secondary | ICD-10-CM | POA: Diagnosis not present

## 2015-05-05 DIAGNOSIS — N390 Urinary tract infection, site not specified: Secondary | ICD-10-CM | POA: Diagnosis not present

## 2015-05-05 DIAGNOSIS — I129 Hypertensive chronic kidney disease with stage 1 through stage 4 chronic kidney disease, or unspecified chronic kidney disease: Secondary | ICD-10-CM | POA: Diagnosis not present

## 2015-05-05 DIAGNOSIS — Z510 Encounter for antineoplastic radiation therapy: Secondary | ICD-10-CM | POA: Diagnosis not present

## 2015-05-05 DIAGNOSIS — G8222 Paraplegia, incomplete: Secondary | ICD-10-CM | POA: Diagnosis not present

## 2015-05-05 DIAGNOSIS — M8458XD Pathological fracture in neoplastic disease, other specified site, subsequent encounter for fracture with routine healing: Secondary | ICD-10-CM | POA: Diagnosis not present

## 2015-05-05 DIAGNOSIS — N189 Chronic kidney disease, unspecified: Secondary | ICD-10-CM | POA: Diagnosis not present

## 2015-05-05 DIAGNOSIS — C7951 Secondary malignant neoplasm of bone: Secondary | ICD-10-CM | POA: Diagnosis not present

## 2015-05-05 NOTE — Telephone Encounter (Signed)
Phoned patient making her aware script was called to [place], [place].

## 2015-05-06 ENCOUNTER — Ambulatory Visit
Admission: RE | Admit: 2015-05-06 | Discharge: 2015-05-06 | Disposition: A | Discharge status: Home / Self Care | Payer: Medicare Other | Source: Ambulatory Visit | Attending: Radiation Oncology | Admitting: Radiation Oncology

## 2015-05-06 DIAGNOSIS — C50411 Malignant neoplasm of upper-outer quadrant of right female breast: Secondary | ICD-10-CM | POA: Diagnosis not present

## 2015-05-06 DIAGNOSIS — M8458XD Pathological fracture in neoplastic disease, other specified site, subsequent encounter for fracture with routine healing: Secondary | ICD-10-CM | POA: Diagnosis not present

## 2015-05-06 DIAGNOSIS — N189 Chronic kidney disease, unspecified: Secondary | ICD-10-CM | POA: Diagnosis not present

## 2015-05-06 DIAGNOSIS — Z510 Encounter for antineoplastic radiation therapy: Secondary | ICD-10-CM | POA: Diagnosis not present

## 2015-05-06 DIAGNOSIS — E785 Hyperlipidemia, unspecified: Secondary | ICD-10-CM | POA: Diagnosis not present

## 2015-05-06 DIAGNOSIS — C7951 Secondary malignant neoplasm of bone: Secondary | ICD-10-CM | POA: Diagnosis not present

## 2015-05-06 DIAGNOSIS — N390 Urinary tract infection, site not specified: Secondary | ICD-10-CM | POA: Diagnosis not present

## 2015-05-06 DIAGNOSIS — I129 Hypertensive chronic kidney disease with stage 1 through stage 4 chronic kidney disease, or unspecified chronic kidney disease: Secondary | ICD-10-CM | POA: Diagnosis not present

## 2015-05-06 DIAGNOSIS — G8222 Paraplegia, incomplete: Secondary | ICD-10-CM | POA: Diagnosis not present

## 2015-05-06 DIAGNOSIS — I10 Essential (primary) hypertension: Secondary | ICD-10-CM | POA: Diagnosis not present

## 2015-05-06 DIAGNOSIS — C50911 Malignant neoplasm of unspecified site of right female breast: Secondary | ICD-10-CM | POA: Diagnosis not present

## 2015-05-07 ENCOUNTER — Ambulatory Visit
Admission: RE | Admit: 2015-05-07 | Discharge: 2015-05-07 | Disposition: A | Discharge status: Home / Self Care | Payer: Medicare Other | Source: Ambulatory Visit | Attending: Radiation Oncology | Admitting: Radiation Oncology

## 2015-05-07 DIAGNOSIS — I10 Essential (primary) hypertension: Secondary | ICD-10-CM | POA: Diagnosis not present

## 2015-05-07 DIAGNOSIS — E785 Hyperlipidemia, unspecified: Secondary | ICD-10-CM | POA: Diagnosis not present

## 2015-05-07 DIAGNOSIS — N390 Urinary tract infection, site not specified: Secondary | ICD-10-CM | POA: Diagnosis not present

## 2015-05-07 DIAGNOSIS — N189 Chronic kidney disease, unspecified: Secondary | ICD-10-CM | POA: Diagnosis not present

## 2015-05-07 DIAGNOSIS — C50411 Malignant neoplasm of upper-outer quadrant of right female breast: Secondary | ICD-10-CM | POA: Diagnosis not present

## 2015-05-07 DIAGNOSIS — M8458XD Pathological fracture in neoplastic disease, other specified site, subsequent encounter for fracture with routine healing: Secondary | ICD-10-CM | POA: Diagnosis not present

## 2015-05-07 DIAGNOSIS — Z510 Encounter for antineoplastic radiation therapy: Secondary | ICD-10-CM | POA: Diagnosis not present

## 2015-05-07 DIAGNOSIS — G8222 Paraplegia, incomplete: Secondary | ICD-10-CM | POA: Diagnosis not present

## 2015-05-07 DIAGNOSIS — C50911 Malignant neoplasm of unspecified site of right female breast: Secondary | ICD-10-CM | POA: Diagnosis not present

## 2015-05-07 DIAGNOSIS — I129 Hypertensive chronic kidney disease with stage 1 through stage 4 chronic kidney disease, or unspecified chronic kidney disease: Secondary | ICD-10-CM | POA: Diagnosis not present

## 2015-05-07 DIAGNOSIS — C7951 Secondary malignant neoplasm of bone: Secondary | ICD-10-CM | POA: Diagnosis not present

## 2015-05-08 ENCOUNTER — Ambulatory Visit
Admission: RE | Admit: 2015-05-08 | Discharge: 2015-05-08 | Disposition: A | Discharge status: Home / Self Care | Payer: Medicare Other | Source: Ambulatory Visit | Attending: Radiation Oncology | Admitting: Radiation Oncology

## 2015-05-08 ENCOUNTER — Encounter: Payer: Self-pay | Admitting: Radiation Oncology

## 2015-05-08 VITALS — BP 120/66 | HR 96 | Resp 16 | Wt 131.0 lb

## 2015-05-08 DIAGNOSIS — E785 Hyperlipidemia, unspecified: Secondary | ICD-10-CM | POA: Diagnosis not present

## 2015-05-08 DIAGNOSIS — C7951 Secondary malignant neoplasm of bone: Principal | ICD-10-CM

## 2015-05-08 DIAGNOSIS — N390 Urinary tract infection, site not specified: Secondary | ICD-10-CM | POA: Diagnosis not present

## 2015-05-08 DIAGNOSIS — I10 Essential (primary) hypertension: Secondary | ICD-10-CM | POA: Diagnosis not present

## 2015-05-08 DIAGNOSIS — C50411 Malignant neoplasm of upper-outer quadrant of right female breast: Secondary | ICD-10-CM | POA: Diagnosis not present

## 2015-05-08 DIAGNOSIS — I129 Hypertensive chronic kidney disease with stage 1 through stage 4 chronic kidney disease, or unspecified chronic kidney disease: Secondary | ICD-10-CM | POA: Diagnosis not present

## 2015-05-08 DIAGNOSIS — M8458XD Pathological fracture in neoplastic disease, other specified site, subsequent encounter for fracture with routine healing: Secondary | ICD-10-CM | POA: Diagnosis not present

## 2015-05-08 DIAGNOSIS — N189 Chronic kidney disease, unspecified: Secondary | ICD-10-CM | POA: Diagnosis not present

## 2015-05-08 DIAGNOSIS — C50911 Malignant neoplasm of unspecified site of right female breast: Secondary | ICD-10-CM

## 2015-05-08 DIAGNOSIS — G8222 Paraplegia, incomplete: Secondary | ICD-10-CM | POA: Diagnosis not present

## 2015-05-08 DIAGNOSIS — Z510 Encounter for antineoplastic radiation therapy: Secondary | ICD-10-CM | POA: Diagnosis not present

## 2015-05-08 NOTE — Progress Notes (Signed)
Vitals stable. Reports thoracic spine pain 3 on a scale of 0-10. Denies taking decadron. Reports difficulty and pain associated with swallowing has greatly improved. Reports using carafate. Reports faint hyperpigmentation and dryness within treatment field. Reports using radiaplex bid as directed. Reports numbness from the waist down continues. Reports a poor appetite due to lack of desire and taste changes. Reports intermittent nausea and vomiting. Supplementing with one protein shake every day or so.   BP 120/66 mmHg  Pulse 96  Resp 16  Wt 131 lb (59.421 kg)  SpO2 100% Wt Readings from Last 3 Encounters:  05/08/15 131 lb (59.421 kg)  04/09/15 131 lb (59.421 kg)  03/27/15 131 lb (59.421 kg)

## 2015-05-08 NOTE — Progress Notes (Signed)
  Radiation Oncology         [phone]     Name: Heather [name] MRN: [id number]   Date: 05/08/2015  DOB: May 07, 1946   Weekly Radiation Therapy Management    ICD-9-CM ICD-10-CM   1. Breast cancer metastasized to bone, right (HCC) 174.9 C50.911    198.5 C79.51     Current Dose: 41.8 Gy  Planned Dose:  55 Gy  Narrative The patient presents for routine under treatment assessment.  Vitals stable. Reports thoracic spine pain 3 on a scale of 0-10. Denies taking decadron. Reports difficulty and pain associated with swallowing has greatly improved. Reports using carafate. Reports faint hyperpigmentation and dryness within treatment field. Reports using radiaplex bid as directed. Reports numbness from the waist down continues. Reports a poor appetite due to lack of desire and taste changes. Reports intermittent nausea and vomiting. Supplementing with one protein shake every day or so. She describes her back pain as "someone irritating it".   The patient is without complaint. Set-up films were reviewed. The chart was checked.  Physical Findings  weight is 131 lb (59.421 kg). Her blood pressure is 120/66 and her pulse is 96. Her respiration is 16 and oxygen saturation is 100%. . Weight essentially stable.  No significant changes.  Impression The patient is tolerating radiation.  Plan Continue treatment as planned.         Sheral Apley [name], M.D.  This document serves as a record of services personally performed by Tyler Pita, MD. It was created on his behalf by Lendon Collar, a trained medical scribe. The creation of this record is based on the scribe's personal observations and the provider's statements to them. This document has been checked and approved by the attending provider.

## 2015-05-11 ENCOUNTER — Other Ambulatory Visit: Payer: Self-pay | Admitting: Physical Medicine and Rehabilitation

## 2015-05-12 ENCOUNTER — Ambulatory Visit
Admission: RE | Admit: 2015-05-12 | Discharge: 2015-05-12 | Disposition: A | Discharge status: Home / Self Care | Payer: Medicare Other | Source: Ambulatory Visit | Attending: Radiation Oncology | Admitting: Radiation Oncology

## 2015-05-12 DIAGNOSIS — M8458XD Pathological fracture in neoplastic disease, other specified site, subsequent encounter for fracture with routine healing: Secondary | ICD-10-CM | POA: Diagnosis not present

## 2015-05-12 DIAGNOSIS — C7951 Secondary malignant neoplasm of bone: Secondary | ICD-10-CM | POA: Insufficient documentation

## 2015-05-12 DIAGNOSIS — Z510 Encounter for antineoplastic radiation therapy: Secondary | ICD-10-CM | POA: Insufficient documentation

## 2015-05-12 DIAGNOSIS — C50411 Malignant neoplasm of upper-outer quadrant of right female breast: Secondary | ICD-10-CM | POA: Insufficient documentation

## 2015-05-12 DIAGNOSIS — I129 Hypertensive chronic kidney disease with stage 1 through stage 4 chronic kidney disease, or unspecified chronic kidney disease: Secondary | ICD-10-CM | POA: Diagnosis not present

## 2015-05-12 DIAGNOSIS — Z170 Estrogen receptor positive status [ER+]: Secondary | ICD-10-CM | POA: Diagnosis not present

## 2015-05-12 DIAGNOSIS — N390 Urinary tract infection, site not specified: Secondary | ICD-10-CM | POA: Diagnosis not present

## 2015-05-12 DIAGNOSIS — G8222 Paraplegia, incomplete: Secondary | ICD-10-CM | POA: Diagnosis not present

## 2015-05-13 ENCOUNTER — Telehealth: Payer: Self-pay | Admitting: *Deleted

## 2015-05-13 ENCOUNTER — Telehealth: Payer: Self-pay

## 2015-05-13 ENCOUNTER — Ambulatory Visit
Admission: RE | Admit: 2015-05-13 | Discharge: 2015-05-13 | Disposition: A | Discharge status: Home / Self Care | Payer: Medicare Other | Source: Ambulatory Visit | Attending: Radiation Oncology | Admitting: Radiation Oncology

## 2015-05-13 DIAGNOSIS — C50411 Malignant neoplasm of upper-outer quadrant of right female breast: Secondary | ICD-10-CM | POA: Diagnosis not present

## 2015-05-13 DIAGNOSIS — Z87440 Personal history of urinary (tract) infections: Secondary | ICD-10-CM | POA: Diagnosis not present

## 2015-05-13 DIAGNOSIS — Z170 Estrogen receptor positive status [ER+]: Secondary | ICD-10-CM | POA: Diagnosis not present

## 2015-05-13 DIAGNOSIS — C7951 Secondary malignant neoplasm of bone: Secondary | ICD-10-CM | POA: Diagnosis not present

## 2015-05-13 DIAGNOSIS — G9529 Other cord compression: Secondary | ICD-10-CM | POA: Diagnosis not present

## 2015-05-13 DIAGNOSIS — N319 Neuromuscular dysfunction of bladder, unspecified: Secondary | ICD-10-CM | POA: Diagnosis not present

## 2015-05-13 DIAGNOSIS — M8458XS Pathological fracture in neoplastic disease, other specified site, sequela: Secondary | ICD-10-CM | POA: Diagnosis not present

## 2015-05-13 DIAGNOSIS — G8222 Paraplegia, incomplete: Secondary | ICD-10-CM | POA: Diagnosis not present

## 2015-05-13 DIAGNOSIS — Z510 Encounter for antineoplastic radiation therapy: Secondary | ICD-10-CM | POA: Diagnosis not present

## 2015-05-13 NOTE — Telephone Encounter (Signed)
Heather [name] with [facility] wanted an extension in orders for BID for 8 weeks. Orders verbally approved.

## 2015-05-13 NOTE — Telephone Encounter (Signed)
[name], PT from [facility] called requesting verbal orders to recert the patient for 60 more days.   She asking for 3 visits a week for 6 weeks and 2 times a week for 2 weeks. I called and gave verbal orders, left VM on secured VM line per office protocol

## 2015-05-14 ENCOUNTER — Ambulatory Visit
Admission: RE | Admit: 2015-05-14 | Discharge: 2015-05-14 | Disposition: A | Discharge status: Home / Self Care | Payer: Medicare Other | Source: Ambulatory Visit | Attending: Radiation Oncology | Admitting: Radiation Oncology

## 2015-05-14 DIAGNOSIS — N319 Neuromuscular dysfunction of bladder, unspecified: Secondary | ICD-10-CM | POA: Diagnosis not present

## 2015-05-14 DIAGNOSIS — Z87440 Personal history of urinary (tract) infections: Secondary | ICD-10-CM | POA: Diagnosis not present

## 2015-05-14 DIAGNOSIS — C50411 Malignant neoplasm of upper-outer quadrant of right female breast: Secondary | ICD-10-CM | POA: Diagnosis not present

## 2015-05-14 DIAGNOSIS — M8458XS Pathological fracture in neoplastic disease, other specified site, sequela: Secondary | ICD-10-CM | POA: Diagnosis not present

## 2015-05-14 DIAGNOSIS — G9529 Other cord compression: Secondary | ICD-10-CM | POA: Diagnosis not present

## 2015-05-14 DIAGNOSIS — G8222 Paraplegia, incomplete: Secondary | ICD-10-CM | POA: Diagnosis not present

## 2015-05-14 DIAGNOSIS — Z170 Estrogen receptor positive status [ER+]: Secondary | ICD-10-CM | POA: Diagnosis not present

## 2015-05-14 DIAGNOSIS — C7951 Secondary malignant neoplasm of bone: Secondary | ICD-10-CM | POA: Diagnosis not present

## 2015-05-14 DIAGNOSIS — Z510 Encounter for antineoplastic radiation therapy: Secondary | ICD-10-CM | POA: Diagnosis not present

## 2015-05-15 ENCOUNTER — Ambulatory Visit
Admission: RE | Admit: 2015-05-15 | Discharge: 2015-05-15 | Disposition: A | Discharge status: Home / Self Care | Payer: Medicare Other | Source: Ambulatory Visit | Attending: Radiation Oncology | Admitting: Radiation Oncology

## 2015-05-15 VITALS — BP 126/70 | HR 76 | Resp 16

## 2015-05-15 DIAGNOSIS — M8458XS Pathological fracture in neoplastic disease, other specified site, sequela: Secondary | ICD-10-CM | POA: Diagnosis not present

## 2015-05-15 DIAGNOSIS — G8222 Paraplegia, incomplete: Secondary | ICD-10-CM | POA: Diagnosis not present

## 2015-05-15 DIAGNOSIS — N319 Neuromuscular dysfunction of bladder, unspecified: Secondary | ICD-10-CM | POA: Diagnosis not present

## 2015-05-15 DIAGNOSIS — Z510 Encounter for antineoplastic radiation therapy: Secondary | ICD-10-CM | POA: Diagnosis not present

## 2015-05-15 DIAGNOSIS — G9529 Other cord compression: Secondary | ICD-10-CM | POA: Diagnosis not present

## 2015-05-15 DIAGNOSIS — Z87440 Personal history of urinary (tract) infections: Secondary | ICD-10-CM | POA: Diagnosis not present

## 2015-05-15 DIAGNOSIS — C50411 Malignant neoplasm of upper-outer quadrant of right female breast: Secondary | ICD-10-CM | POA: Diagnosis not present

## 2015-05-15 DIAGNOSIS — C7951 Secondary malignant neoplasm of bone: Secondary | ICD-10-CM | POA: Diagnosis not present

## 2015-05-15 DIAGNOSIS — Z170 Estrogen receptor positive status [ER+]: Secondary | ICD-10-CM | POA: Diagnosis not present

## 2015-05-15 NOTE — Progress Notes (Addendum)
  Radiation Oncology         [phone]     Name: Heather [name] MRN: [id number]   Date: 05/15/2015  DOB: Apr 29, 1946   Weekly Radiation Therapy Management    ICD-9-CM ICD-10-CM   1. Primary cancer of upper outer quadrant of right female breast ([place]) 174.4 C50.411     Current Dose: 50.6 Gy  Planned Dose:  55 Gy  Narrative The patient presents for routine under treatment assessment.  Vitals stable. Reports thoracic spine pain 3 on a scale of 0-10. Denies taking decadron. Reports pain and difficulty swallowing is worse despite taking carafate. Additionally, patient reports increased heartburn. Last night the heartburn was more severe and she had to sleep with her bed raised. Reports faint hyperpigmentation and dryness within treatment area. Using radiaplex bid. Reports intermittent nausea. Continues to feel numb from the waist down. Continues to attend physical therapy. One month follow up appointment card given. Patient understands to contact this MD with future needs.   The patient is without complaint. Set-up films were reviewed. The chart was checked.  Physical Findings  blood pressure is 126/70 and her pulse is 76. Her respiration is 16 and oxygen saturation is 100%. . Weight essentially stable.  No significant changes. In wheelchair. Dark red erythema with a bluish tint in the treatment area.   Impression The patient is tolerating radiation.  Plan Continue treatment as planned. The patient will complete treatment next week and will follow up in one month. The patient will call if they have any questions in the meantime.         Sheral Apley [name], M.D.  This document serves as a record of services personally performed by Tyler Pita, MD. It was created on his behalf by Arlyce Harman, a trained medical scribe. The creation of this record is based on the scribe's personal observations and the provider's statements to them. This document has been checked and approved by the  attending provider.

## 2015-05-15 NOTE — Progress Notes (Addendum)
Vitals stable. Reports thoracic spine pain 3 on a scale of 0-10. Denies taking decadron. Reports pain and difficulty swallowing is worse despite taking carafate. Additionally, patient reports increased heartburn. Reports faint hyperpigmentation and dryness within treatment. Using radiaplex bid. Reports intermittent nausea. Continues to feel numb from the waist down. One month follow up appointment card given. Patient understands to contact this RN with future needs.   BP 126/70 mmHg  Pulse 76  Resp 16  SpO2 100% Wt Readings from Last 3 Encounters:  05/08/15 131 lb (59.421 kg)  04/09/15 131 lb (59.421 kg)  03/27/15 131 lb (59.421 kg)

## 2015-05-17 HISTORY — PX: OTHER SURGICAL HISTORY: SHX169

## 2015-05-18 ENCOUNTER — Ambulatory Visit
Admission: RE | Admit: 2015-05-18 | Discharge: 2015-05-18 | Disposition: A | Discharge status: Home / Self Care | Payer: Medicare Other | Source: Ambulatory Visit | Attending: Radiation Oncology | Admitting: Radiation Oncology

## 2015-05-18 DIAGNOSIS — C50411 Malignant neoplasm of upper-outer quadrant of right female breast: Secondary | ICD-10-CM | POA: Diagnosis not present

## 2015-05-18 DIAGNOSIS — C7951 Secondary malignant neoplasm of bone: Secondary | ICD-10-CM | POA: Diagnosis not present

## 2015-05-18 DIAGNOSIS — Z510 Encounter for antineoplastic radiation therapy: Secondary | ICD-10-CM | POA: Diagnosis not present

## 2015-05-18 DIAGNOSIS — Z170 Estrogen receptor positive status [ER+]: Secondary | ICD-10-CM | POA: Diagnosis not present

## 2015-05-19 ENCOUNTER — Encounter: Payer: Medicare Other | Admitting: Physical Medicine & Rehabilitation

## 2015-05-19 ENCOUNTER — Encounter: Payer: Self-pay | Admitting: Radiation Oncology

## 2015-05-19 ENCOUNTER — Ambulatory Visit
Admission: RE | Admit: 2015-05-19 | Discharge: 2015-05-19 | Disposition: A | Discharge status: Home / Self Care | Payer: Medicare Other | Source: Ambulatory Visit | Attending: Radiation Oncology | Admitting: Radiation Oncology

## 2015-05-19 ENCOUNTER — Telehealth: Payer: Self-pay | Admitting: *Deleted

## 2015-05-19 DIAGNOSIS — C50411 Malignant neoplasm of upper-outer quadrant of right female breast: Secondary | ICD-10-CM | POA: Diagnosis not present

## 2015-05-19 DIAGNOSIS — C7951 Secondary malignant neoplasm of bone: Secondary | ICD-10-CM | POA: Diagnosis not present

## 2015-05-19 DIAGNOSIS — Z170 Estrogen receptor positive status [ER+]: Secondary | ICD-10-CM | POA: Diagnosis not present

## 2015-05-19 DIAGNOSIS — Z510 Encounter for antineoplastic radiation therapy: Secondary | ICD-10-CM | POA: Diagnosis not present

## 2015-05-19 NOTE — Telephone Encounter (Signed)
Wouldn't it make more sense for [place] to contact the orthotist?

## 2015-05-19 NOTE — Telephone Encounter (Signed)
Heather [name] [facility] called requesting an order to eval and treat for lower ext orthotics to assist with knees  Due to myelopathy.  The order can be faxed to their office (fx # [phone]) and they will forward it to an orthotics provider.  The dx myelopathy needs to be on order.  Is this ok to do the order?

## 2015-05-19 NOTE — Telephone Encounter (Signed)
Do you want to order?

## 2015-05-20 DIAGNOSIS — G8222 Paraplegia, incomplete: Secondary | ICD-10-CM | POA: Diagnosis not present

## 2015-05-20 DIAGNOSIS — M8458XS Pathological fracture in neoplastic disease, other specified site, sequela: Secondary | ICD-10-CM | POA: Diagnosis not present

## 2015-05-20 DIAGNOSIS — G9529 Other cord compression: Secondary | ICD-10-CM | POA: Diagnosis not present

## 2015-05-20 DIAGNOSIS — C7951 Secondary malignant neoplasm of bone: Secondary | ICD-10-CM | POA: Diagnosis not present

## 2015-05-20 DIAGNOSIS — N319 Neuromuscular dysfunction of bladder, unspecified: Secondary | ICD-10-CM | POA: Diagnosis not present

## 2015-05-20 DIAGNOSIS — Z87440 Personal history of urinary (tract) infections: Secondary | ICD-10-CM | POA: Diagnosis not present

## 2015-05-20 NOTE — Assessment & Plan Note (Signed)
Right breast invasive ductal carcinoma ER positive PR negative HER-2 positive Ki-67 44% multifocal disease 3/7 lymph nodes positive T2 N1 M0 stage IIB status post adjuvant chemotherapy with TCH followed by Herceptin maintenance, adjuvant radiation therapy, currently on letrozole since 02/07/2013. MRI 02/14/2015: T5 large destructive lesion with pathologic compression fracture and extensive epidural tumor, involvement of T4, excision metastatic carcinoma ER 60%, PR 0%, HER-2 positive ratio 2.71 average copy #7.45 PET CT 03/18/2015:Residual enhancing soft tissue adjacent to the spinal cord. No evidence of metastatic disease. Nonspecific uptake the left nipple. 03/27/2015: T4-6 decompression surgery for spinal cord compression  Recommendation: 1. Radiation therapy to the spine (Dr. Manning) started 12/05/2016completed 05/19/2015 2. Resume anastrozole 05/21/2015 3. Herceptin every 3 week started 04/10/2015 4. Obtain echocardiograms every 3 months  Bone metastases: Patient will receive Zometa every 3 weeks with calcium and vitamin D Hypoproteinemia: patient has met with dietitian Goal of treatment: Palliation  Return to clinic in 6 weeks And every 3 weeks for Herceptin  

## 2015-05-20 NOTE — Telephone Encounter (Signed)
There are obviously numerous types of "orthotics" for the LE's---I typically see the patient to help decide upon what type is needed and then make the orthotic referral. (she has an appt with me on 1/23).  If PT knows what type of brace they are looking at, I would be willing to make the referral directly to [facility], however to speed up the process.

## 2015-05-20 NOTE — Telephone Encounter (Signed)
I left the message for [name] PT to call us back with the information if she has it as to what type of splint (I gave her Dr Charm Barges message).

## 2015-05-21 ENCOUNTER — Encounter: Payer: Self-pay | Admitting: Hematology and Oncology

## 2015-05-21 ENCOUNTER — Ambulatory Visit (HOSPITAL_BASED_OUTPATIENT_CLINIC_OR_DEPARTMENT_OTHER): Payer: Medicare Other | Admitting: Hematology and Oncology

## 2015-05-21 ENCOUNTER — Ambulatory Visit: Payer: Medicare Other

## 2015-05-21 ENCOUNTER — Ambulatory Visit (HOSPITAL_BASED_OUTPATIENT_CLINIC_OR_DEPARTMENT_OTHER): Payer: Medicare Other

## 2015-05-21 ENCOUNTER — Ambulatory Visit: Payer: Medicare Other | Admitting: Hematology and Oncology

## 2015-05-21 ENCOUNTER — Other Ambulatory Visit (HOSPITAL_BASED_OUTPATIENT_CLINIC_OR_DEPARTMENT_OTHER): Payer: Medicare Other

## 2015-05-21 ENCOUNTER — Other Ambulatory Visit: Payer: Medicare Other

## 2015-05-21 ENCOUNTER — Telehealth: Payer: Self-pay | Admitting: Hematology and Oncology

## 2015-05-21 VITALS — BP 131/72 | HR 91 | Temp 98.2°F | Resp 18

## 2015-05-21 DIAGNOSIS — C50411 Malignant neoplasm of upper-outer quadrant of right female breast: Secondary | ICD-10-CM

## 2015-05-21 DIAGNOSIS — C50911 Malignant neoplasm of unspecified site of right female breast: Secondary | ICD-10-CM

## 2015-05-21 DIAGNOSIS — Z5112 Encounter for antineoplastic immunotherapy: Secondary | ICD-10-CM

## 2015-05-21 DIAGNOSIS — C7951 Secondary malignant neoplasm of bone: Secondary | ICD-10-CM | POA: Diagnosis not present

## 2015-05-21 DIAGNOSIS — Z452 Encounter for adjustment and management of vascular access device: Secondary | ICD-10-CM | POA: Diagnosis present

## 2015-05-21 DIAGNOSIS — D638 Anemia in other chronic diseases classified elsewhere: Secondary | ICD-10-CM | POA: Insufficient documentation

## 2015-05-21 DIAGNOSIS — Z170 Estrogen receptor positive status [ER+]: Secondary | ICD-10-CM

## 2015-05-21 DIAGNOSIS — Z95828 Presence of other vascular implants and grafts: Secondary | ICD-10-CM

## 2015-05-21 LAB — COMPREHENSIVE METABOLIC PANEL
ALT: 16 U/L (ref 0–55)
AST: 25 U/L (ref 5–34)
Albumin: 3.4 g/dL — ABNORMAL LOW (ref 3.5–5.0)
Alkaline Phosphatase: 71 U/L (ref 40–150)
Anion Gap: 9 mEq/L (ref 3–11)
BUN: 14.5 mg/dL (ref 7.0–26.0)
CO2: 26 mEq/L (ref 22–29)
Calcium: 9.8 mg/dL (ref 8.4–10.4)
Chloride: 103 mEq/L (ref 98–109)
Creatinine: 0.7 mg/dL (ref 0.6–1.1)
EGFR: 90 mL/min/{1.73_m2} — ABNORMAL LOW (ref >90)
Glucose: 100 mg/dl (ref 70–140)
Potassium: 4.2 mEq/L (ref 3.5–5.1)
Sodium: 137 mEq/L (ref 136–145)
Total Bilirubin: 0.43 mg/dL (ref 0.20–1.20)
Total Protein: 6.8 g/dL (ref 6.4–8.3)

## 2015-05-21 LAB — CBC WITH DIFFERENTIAL/PLATELET
BASO%: 0.4 % (ref 0.0–2.0)
Basophils Absolute: 0 10*3/uL (ref 0.0–0.1)
EOS%: 1.3 % (ref 0.0–7.0)
Eosinophils Absolute: 0.1 10*3/uL (ref 0.0–0.5)
HCT: 31.3 % — ABNORMAL LOW (ref 34.8–46.6)
HGB: 9.5 g/dL — ABNORMAL LOW (ref 11.6–15.9)
LYMPH%: 9.4 % — ABNORMAL LOW (ref 14.0–49.7)
MCH: 26.2 pg (ref 25.1–34.0)
MCHC: 30.4 g/dL — ABNORMAL LOW (ref 31.5–36.0)
MCV: 86.2 fL (ref 79.5–101.0)
MONO#: 0.3 10*3/uL (ref 0.1–0.9)
MONO%: 6.6 % (ref 0.0–14.0)
NEUT#: 3.8 10*3/uL (ref 1.5–6.5)
NEUT%: 82.3 % — ABNORMAL HIGH (ref 38.4–76.8)
Platelets: 274 10*3/uL (ref 145–400)
RBC: 3.63 10*6/uL — ABNORMAL LOW (ref 3.70–5.45)
RDW: 16.6 % — ABNORMAL HIGH (ref 11.2–14.5)
WBC: 4.6 10*3/uL (ref 3.9–10.3)
lymph#: 0.4 10*3/uL — ABNORMAL LOW (ref 0.9–3.3)
nRBC: 0 % (ref 0–0)

## 2015-05-21 MED ORDER — SODIUM CHLORIDE 0.9 % IJ SOLN
10.0000 mL | INTRAMUSCULAR | Status: DC | PRN
  Filled 2015-05-21: qty 10

## 2015-05-21 MED ORDER — ZOLEDRONIC ACID 4 MG/100ML IV SOLN
4.0000 mg | Freq: Once | INTRAVENOUS | Status: AC
  Administered 2015-05-21: 4 mg via INTRAVENOUS
  Filled 2015-05-21: qty 100

## 2015-05-21 MED ORDER — TRASTUZUMAB CHEMO INJECTION 440 MG
6.0000 mg/kg | Freq: Once | INTRAVENOUS | Status: AC
  Administered 2015-05-21: 357 mg via INTRAVENOUS
  Filled 2015-05-21: qty 17

## 2015-05-21 MED ORDER — ACETAMINOPHEN 325 MG PO TABS
650.0000 mg | ORAL_TABLET | Freq: Once | ORAL | Status: AC
  Administered 2015-05-21: 650 mg via ORAL

## 2015-05-21 MED ORDER — HEPARIN SOD (PORK) LOCK FLUSH 100 UNIT/ML IV SOLN
500.0000 [IU] | Freq: Once | INTRAVENOUS | Status: AC
  Administered 2015-05-21: 500 [IU] via INTRAVENOUS
  Filled 2015-05-21: qty 5

## 2015-05-21 MED ORDER — ACETAMINOPHEN 325 MG PO TABS
ORAL_TABLET | ORAL | Status: AC
  Filled 2015-05-21: qty 2

## 2015-05-21 MED ORDER — DIPHENHYDRAMINE HCL 25 MG PO CAPS
ORAL_CAPSULE | ORAL | Status: AC
  Filled 2015-05-21: qty 1

## 2015-05-21 MED ORDER — SODIUM CHLORIDE 0.9 % IJ SOLN
10.0000 mL | INTRAMUSCULAR | Status: DC | PRN
  Administered 2015-05-21: 10 mL via INTRAVENOUS
  Filled 2015-05-21: qty 10

## 2015-05-21 MED ORDER — HEPARIN SOD (PORK) LOCK FLUSH 100 UNIT/ML IV SOLN
500.0000 [IU] | Freq: Once | INTRAVENOUS | Status: AC | PRN
  Administered 2015-05-21: 500 [IU]
  Filled 2015-05-21: qty 5

## 2015-05-21 MED ORDER — DIPHENHYDRAMINE HCL 25 MG PO CAPS
25.0000 mg | ORAL_CAPSULE | Freq: Once | ORAL | Status: AC
  Administered 2015-05-21: 25 mg via ORAL

## 2015-05-21 MED ORDER — SODIUM CHLORIDE 0.9 % IV SOLN
Freq: Once | INTRAVENOUS | Status: AC
  Administered 2015-05-21: 15:00:00 via INTRAVENOUS

## 2015-05-21 NOTE — Telephone Encounter (Signed)
[name] called back and Heather [name] is standing and they would like to start taking a few steps.  Both knees hyperextend and [name] does not want to proceed until she has some assistance with control.  She does not know if she needs afo's for this or something else.  Please advise.

## 2015-05-21 NOTE — Telephone Encounter (Signed)
WE CAN MAKE A REFERRAL TO [facility] FOR BILATERAL AFO'S---I CAN SPEAK TO JEFF TO LET HIM KNOW AND THEN WE CAN WORK ON WHAT'S ACTUALLY APPROPRIATE. AGAIN, TYPICALLY, I WILL SEE THESE PATIENTS IN THE OFFICE TO HELP DETERMINE THEIR ORTHOTIC NEEDS.

## 2015-05-21 NOTE — Telephone Encounter (Signed)
Gv pt appt for 2/23.

## 2015-05-21 NOTE — Addendum Note (Signed)
Addended by: Prentiss Bells on: 05/21/2015 02:33 PM   Modules accepted: Medications

## 2015-05-21 NOTE — Progress Notes (Signed)
Patient Care Team: [name], MD as PCP - General (Family Medicine)  DIAGNOSIS: Primary cancer of upper outer quadrant of right female breast [facility])   Staging form: Breast, AJCC 7th Edition     Clinical: Stage IIB (T2, N1, cM0) - Unsigned       Staging comments: Staged at breast conference 11.6.13      Pathologic: No stage assigned - Unsigned   SUMMARY OF ONCOLOGIC HISTORY:   Primary cancer of upper outer quadrant of right female breast ([place])   03/08/2012 Initial Diagnosis Right breast invasive ductal carcinoma ER positive PR negative HER-2 positive Ki-67 44%; another breast mass biopsied in the anterior part of the breast which was also positive for malignancy that was HER-2 negative   05/10/2012 Surgery Right mastectomy and axillary lymph node dissection: Multifocal disease 5 cm, 1.7 cm, 1.6 cm, ER positive PR negative HER-2 positive Ki-67 44%, 3/7 lymph nodes positive   06/14/2012 - 06/12/2013 Chemotherapy Adjuvant chemotherapy with [place] 6 followed by Herceptin maintenance   10/25/2012 - 12/11/2012 Radiation Therapy Adjuvant radiation therapy   02/07/2013 -  Anti-estrogen oral therapy Letrozole 2.5 mg daily   02/14/2015 Imaging MRI spine: Large destructive T5 lesion with severe pathologic compression fracture and extensive epidural tumor, severe spinal stenosis with moderate cord compression, tumor involvement of T4   03/18/2015 PET scan Residual enhancing soft tissue adjacent to the spinal cord. No evidence of metastatic disease. Nonspecific uptake the left nipple   03/27/2015 - 03/30/2015 Hospital Admission T4-6 decompression for spinal cord compression (lower extremity paralysis)   04/10/2015 -  Chemotherapy Palliative treatment with Herceptin every 3 weeks   04/13/2015 - 05/19/2015 Radiation Therapy Palliative radiation treatment to the spine    CHIEF COMPLIANT: follow-up on Herceptin and Zometa  INTERVAL HISTORY: Heather [name] is a 70 year old with above-mentioned history metastatic  breast cancer who had completed palliative radiation therapy to the spine and is here today to receive Herceptin treatment. She has been tolerating Herceptin extremely well. She receives Zometa and had profound fatigue that lasted 3-4 days after the infusion. She did not have any bone pain. She is continuing to work with physical therapy to get leg strength. She is now able to stand and is trying to see if she can shuffle her legs.  REVIEW OF SYSTEMS:   Constitutional: Denies fevers, chills or abnormal weight loss Eyes: Denies blurriness of vision Ears, nose, mouth, throat, and face: Denies mucositis or sore throat Respiratory: Denies cough, dyspnea or wheezes Cardiovascular: Denies palpitation, chest discomfort Gastrointestinal:  Denies nausea, heartburn or change in bowel habits Skin: Denies abnormal skin rashes Lymphatics: Denies new lymphadenopathy or easy bruising Neurological:lower extremity sensory deficit and paralysis Behavioral/Psych: Mood is stable, no new changes  Extremities: No lower extremity edema  All other systems were reviewed with the patient and are negative.  I have reviewed the past medical history, past surgical history, social history and family history with the patient and they are unchanged from previous note.  ALLERGIES:  is allergic to codeine; keflex; naprosyn; oxycodone; and tussin.  MEDICATIONS:  Current Outpatient Prescriptions  Medication Sig Dispense Refill  . apixaban (ELIQUIS) 2.5 MG TABS tablet Take 1 tablet (2.5 mg total) by mouth 2 (two) times daily. 180 tablet 0  . b complex vitamins tablet Take 1 tablet by mouth daily.    . Calcium Carbonate Antacid (TUMS PO) Take 2 tablets by mouth 2 (two) times daily as needed (acid reflux).    . Calcium Carbonate-Vitamin D (CALCIUM-D  PO) Take 1 tablet by mouth daily.    . Cholecalciferol (VITAMIN D) 2000 UNITS tablet Take 2,000 Units by mouth daily.    . Coenzyme Q10 (COQ10) 100 MG CAPS Take 100 mg by mouth at  bedtime.    Marland Kitchen dexamethasone (DECADRON) 1 MG tablet Take 1 tablet (1 mg total) by mouth 2 (two) times daily with a meal. 26m PO BID x 5 days then 132mPO BID x 5 days, then off. (Patient not taking: Reported on 05/08/2015) 10 tablet 0  . gabapentin (NEURONTIN) 400 MG capsule TAKE ONE CAPSULE BY MOUTH THREE TIMES DAILY 90 capsule 0  . lidocaine-prilocaine (EMLA) cream Apply to affected area once (Patient taking differently: Apply 1 application topically every 21 ( twenty-one) days. Apply to port prior to infusions) 30 g 3  . methocarbamol (ROBAXIN) 500 MG tablet Take 1 tablet (500 mg total) by mouth daily as needed for muscle spasms. 75 tablet 0  . OVER THE COUNTER MEDICATION Place 1 drop into both eyes 2 (two) times daily as needed (dry eyes). Over the counter lubricating eye drops    . polyethylene glycol (MIRALAX / GLYCOLAX) packet Take 17 g by mouth 2 (two) times daily. (Patient taking differently: Take 8.5 g by mouth daily as needed (constipation). ) 100 each 0  . pravastatin (PRAVACHOL) 10 MG tablet Take 10 mg by mouth at bedtime.     . Probiotic Product (PROBIOTIC DAILY PO) Take 1 capsule by mouth 2 (two) times daily.     . prochlorperazine (COMPAZINE) 10 MG tablet Take 1 tablet (10 mg total) by mouth every 6 (six) hours as needed for nausea or vomiting. 30 tablet 0  . ranitidine (ZANTAC) 300 MG tablet Take 300 mg by mouth at bedtime.     . Marland Kitchenpironolactone (ALDACTONE) 25 MG tablet Take 0.5 tablets (12.5 mg total) by mouth daily. 45 tablet 12  . sucralfate (CARAFATE) 1 G tablet Take 1 tablet (1 g total) by mouth 4 (four) times daily. 120 tablet 2  . traMADol (ULTRAM) 50 MG tablet Take 1 tablet (50 mg total) by mouth every 6 (six) hours as needed for moderate pain. 60 tablet 0  . Wound Cleansers (RADIAPLEX EX) Apply topically.     No current facility-administered medications for this visit.    PHYSICAL EXAMINATION: ECOG PERFORMANCE STATUS: 1 - Symptomatic but completely ambulatory  Filed  Vitals:   05/21/15 1325  BP: 131/72  Pulse: 91  Temp: 98.2 F (36.8 C)  Resp: 18   There were no vitals filed for this visit.  GENERAL:alert, no distress and comfortable SKIN: skin color, texture, turgor are normal, no rashes or significant lesions EYES: normal, Conjunctiva are pink and non-injected, sclera clear OROPHARYNX:no exudate, no erythema and lips, buccal mucosa, and tongue normal  NECK: supple, thyroid normal size, non-tender, without nodularity LYMPH:  no palpable lymphadenopathy in the cervical, axillary or inguinal LUNGS: clear to auscultation and percussion with normal breathing effort HEART: regular rate & rhythm and no murmurs and no lower extremity edema ABDOMEN:abdomen soft, non-tender and normal bowel sounds MUSCULOSKELETAL:no cyanosis of digits and no clubbing  NEURO: alert & oriented x 3 with fluent speech, lower extremity paralysis sensory deficit EXTREMITIES: No lower extremity edema  LABORATORY DATA:  I have reviewed the data as listed   Chemistry      Component Value Date/Time   NA 138 04/30/2015 1000   NA 135 03/28/2015 0446   K 4.0 04/30/2015 1000   K 3.8 03/28/2015 0446  CL 99* 03/28/2015 0446   CL 101 10/25/2012 0946   CO2 28 04/30/2015 1000   CO2 28 03/28/2015 0446   BUN 13.1 04/30/2015 1000   BUN 24* 03/28/2015 0446   CREATININE 0.8 04/30/2015 1000   CREATININE 0.78 03/28/2015 0446      Component Value Date/Time   CALCIUM 9.2 04/30/2015 1000   CALCIUM 7.8* 03/28/2015 0446   ALKPHOS 98 04/30/2015 1000   ALKPHOS 124 03/27/2015 1434   AST 19 04/30/2015 1000   AST 42* 03/27/2015 1434   ALT 14 04/30/2015 1000   ALT 136* 03/27/2015 1434   BILITOT 0.34 04/30/2015 1000   BILITOT 0.9 03/27/2015 1434       Lab Results  Component Value Date   WBC 4.6 05/21/2015   HGB 9.5* 05/21/2015   HCT 31.3* 05/21/2015   MCV 86.2 05/21/2015   PLT 274 05/21/2015   NEUTROABS 3.8 05/21/2015     ASSESSMENT & PLAN:  Primary cancer of upper outer  quadrant of right female breast ([place]) Right breast invasive ductal carcinoma ER positive PR negative HER-2 positive Ki-67 44% multifocal disease 3/7 lymph nodes positive T2 N1 M0 stage IIB status post adjuvant chemotherapy with TCH followed by Herceptin maintenance, adjuvant radiation therapy, currently on letrozole since 02/07/2013. MRI 02/14/2015: T5 large destructive lesion with pathologic compression fracture and extensive epidural tumor, involvement of T4, excision metastatic carcinoma ER 60%, PR 0%, HER-2 positive ratio 2.71 average copy #7.45 PET CT 03/18/2015:Residual enhancing soft tissue adjacent to the spinal cord. No evidence of metastatic disease. Nonspecific uptake the left nipple. 03/27/2015: T4-6 decompression surgery for spinal cord compression Radiation therapy to the spine (Dr. [name]) started 12/05/2016completed 05/19/2015  Recommendation: 1. Resumed anastrozole 05/21/2015 2. Herceptin every 3 week started 04/10/2015 3. Obtain echocardiograms every 3 months 4. Bone metastases: Patient will receive Zometa every 3 weeks with calcium and vitamin D  Zometa toxicities: Patient felt fatigued to 3 days after receiving Zometa. If it causes persistent symptoms, we might switch her Zometa every 3 months.  Hypoproteinemia: patient trying to increase her dietary protein And her albumin level significant increase of 3.4  Anemia of chronic disease:Hemoglobin is slowly improving. Today's hemoglobin is 9.5 g Goal of treatment: Palliation  Return to clinic in 6 weeks And every 3 weeks for Herceptin  No orders of the defined types were placed in this encounter.   The patient has a good understanding of the overall plan. she agrees with it. she will call with any problems that may develop before the next visit here.   Rulon Eisenmenger, MD 05/21/2015

## 2015-05-21 NOTE — Patient Instructions (Addendum)
[facility] Discharge Instructions for Patients Receiving Chemotherapy  Today you received the following chemotherapy agents Zometa and Herceptin.  To help prevent nausea and vomiting after your treatment, we encourage you to take your nausea medication as directed.   If you develop nausea and vomiting that is not controlled by your nausea medication, call the clinic.   BELOW ARE SYMPTOMS THAT SHOULD BE REPORTED IMMEDIATELY:  *FEVER GREATER THAN 100.5 F  *CHILLS WITH OR WITHOUT FEVER  NAUSEA AND VOMITING THAT IS NOT CONTROLLED WITH YOUR NAUSEA MEDICATION  *UNUSUAL SHORTNESS OF BREATH  *UNUSUAL BRUISING OR BLEEDING  TENDERNESS IN MOUTH AND THROAT WITH OR WITHOUT PRESENCE OF ULCERS  *URINARY PROBLEMS  *BOWEL PROBLEMS  UNUSUAL RASH Items with * indicate a potential emergency and should be followed up as soon as possible.  Feel free to call the clinic you have any questions or concerns. The clinic phone number is [phone].  Please show the [place] at check-in to the Emergency Department and triage nurse.

## 2015-05-22 DIAGNOSIS — G8222 Paraplegia, incomplete: Secondary | ICD-10-CM | POA: Diagnosis not present

## 2015-05-22 DIAGNOSIS — M8458XS Pathological fracture in neoplastic disease, other specified site, sequela: Secondary | ICD-10-CM | POA: Diagnosis not present

## 2015-05-22 DIAGNOSIS — G9529 Other cord compression: Secondary | ICD-10-CM | POA: Diagnosis not present

## 2015-05-22 DIAGNOSIS — N319 Neuromuscular dysfunction of bladder, unspecified: Secondary | ICD-10-CM | POA: Diagnosis not present

## 2015-05-22 DIAGNOSIS — Z87440 Personal history of urinary (tract) infections: Secondary | ICD-10-CM | POA: Diagnosis not present

## 2015-05-22 DIAGNOSIS — C7951 Secondary malignant neoplasm of bone: Secondary | ICD-10-CM | POA: Diagnosis not present

## 2015-05-22 NOTE — Telephone Encounter (Signed)
Faxed referral to [facility] with note that Dr Naaman Plummer will speak with Heather [name] about Heather [name]

## 2015-05-23 DIAGNOSIS — N319 Neuromuscular dysfunction of bladder, unspecified: Secondary | ICD-10-CM | POA: Insufficient documentation

## 2015-05-23 DIAGNOSIS — R0789 Other chest pain: Secondary | ICD-10-CM | POA: Insufficient documentation

## 2015-05-23 DIAGNOSIS — I129 Hypertensive chronic kidney disease with stage 1 through stage 4 chronic kidney disease, or unspecified chronic kidney disease: Secondary | ICD-10-CM | POA: Insufficient documentation

## 2015-05-23 DIAGNOSIS — M8448XS Pathological fracture, other site, sequela: Secondary | ICD-10-CM | POA: Insufficient documentation

## 2015-05-23 DIAGNOSIS — N189 Chronic kidney disease, unspecified: Secondary | ICD-10-CM | POA: Insufficient documentation

## 2015-05-23 DIAGNOSIS — M25511 Pain in right shoulder: Secondary | ICD-10-CM | POA: Insufficient documentation

## 2015-05-23 DIAGNOSIS — Z452 Encounter for adjustment and management of vascular access device: Secondary | ICD-10-CM | POA: Insufficient documentation

## 2015-05-23 DIAGNOSIS — E785 Hyperlipidemia, unspecified: Secondary | ICD-10-CM | POA: Insufficient documentation

## 2015-05-23 DIAGNOSIS — Z85828 Personal history of other malignant neoplasm of skin: Secondary | ICD-10-CM | POA: Insufficient documentation

## 2015-05-23 DIAGNOSIS — Z853 Personal history of malignant neoplasm of breast: Secondary | ICD-10-CM | POA: Insufficient documentation

## 2015-05-23 DIAGNOSIS — D62 Acute posthemorrhagic anemia: Secondary | ICD-10-CM | POA: Insufficient documentation

## 2015-05-23 DIAGNOSIS — F419 Anxiety disorder, unspecified: Secondary | ICD-10-CM | POA: Insufficient documentation

## 2015-05-23 DIAGNOSIS — R42 Dizziness and giddiness: Secondary | ICD-10-CM | POA: Insufficient documentation

## 2015-05-23 DIAGNOSIS — C7951 Secondary malignant neoplasm of bone: Secondary | ICD-10-CM | POA: Insufficient documentation

## 2015-05-23 DIAGNOSIS — G839 Paralytic syndrome, unspecified: Secondary | ICD-10-CM | POA: Insufficient documentation

## 2015-05-23 DIAGNOSIS — K219 Gastro-esophageal reflux disease without esophagitis: Secondary | ICD-10-CM | POA: Insufficient documentation

## 2015-05-23 DIAGNOSIS — R202 Paresthesia of skin: Secondary | ICD-10-CM | POA: Insufficient documentation

## 2015-05-23 DIAGNOSIS — G9520 Unspecified cord compression: Secondary | ICD-10-CM | POA: Insufficient documentation

## 2015-05-24 DIAGNOSIS — Z87440 Personal history of urinary (tract) infections: Secondary | ICD-10-CM | POA: Diagnosis not present

## 2015-05-24 DIAGNOSIS — N319 Neuromuscular dysfunction of bladder, unspecified: Secondary | ICD-10-CM | POA: Diagnosis not present

## 2015-05-24 DIAGNOSIS — G9529 Other cord compression: Secondary | ICD-10-CM | POA: Diagnosis not present

## 2015-05-24 DIAGNOSIS — M8458XS Pathological fracture in neoplastic disease, other specified site, sequela: Secondary | ICD-10-CM | POA: Diagnosis not present

## 2015-05-24 DIAGNOSIS — G8222 Paraplegia, incomplete: Secondary | ICD-10-CM | POA: Diagnosis not present

## 2015-05-24 DIAGNOSIS — C7951 Secondary malignant neoplasm of bone: Secondary | ICD-10-CM | POA: Diagnosis not present

## 2015-05-25 DIAGNOSIS — M8458XS Pathological fracture in neoplastic disease, other specified site, sequela: Secondary | ICD-10-CM | POA: Diagnosis not present

## 2015-05-25 DIAGNOSIS — Z87440 Personal history of urinary (tract) infections: Secondary | ICD-10-CM | POA: Diagnosis not present

## 2015-05-25 DIAGNOSIS — C7951 Secondary malignant neoplasm of bone: Secondary | ICD-10-CM | POA: Diagnosis not present

## 2015-05-25 DIAGNOSIS — G9529 Other cord compression: Secondary | ICD-10-CM | POA: Diagnosis not present

## 2015-05-25 DIAGNOSIS — N319 Neuromuscular dysfunction of bladder, unspecified: Secondary | ICD-10-CM | POA: Diagnosis not present

## 2015-05-25 DIAGNOSIS — G8222 Paraplegia, incomplete: Secondary | ICD-10-CM | POA: Diagnosis not present

## 2015-05-26 DIAGNOSIS — M8458XS Pathological fracture in neoplastic disease, other specified site, sequela: Secondary | ICD-10-CM | POA: Diagnosis not present

## 2015-05-26 DIAGNOSIS — C7951 Secondary malignant neoplasm of bone: Secondary | ICD-10-CM | POA: Diagnosis not present

## 2015-05-26 DIAGNOSIS — N319 Neuromuscular dysfunction of bladder, unspecified: Secondary | ICD-10-CM | POA: Diagnosis not present

## 2015-05-26 DIAGNOSIS — G9529 Other cord compression: Secondary | ICD-10-CM | POA: Diagnosis not present

## 2015-05-26 DIAGNOSIS — Z87440 Personal history of urinary (tract) infections: Secondary | ICD-10-CM | POA: Diagnosis not present

## 2015-05-26 DIAGNOSIS — G8222 Paraplegia, incomplete: Secondary | ICD-10-CM | POA: Diagnosis not present

## 2015-05-27 DIAGNOSIS — G9529 Other cord compression: Secondary | ICD-10-CM | POA: Diagnosis not present

## 2015-05-27 DIAGNOSIS — M8458XS Pathological fracture in neoplastic disease, other specified site, sequela: Secondary | ICD-10-CM | POA: Diagnosis not present

## 2015-05-27 DIAGNOSIS — Z87440 Personal history of urinary (tract) infections: Secondary | ICD-10-CM | POA: Diagnosis not present

## 2015-05-27 DIAGNOSIS — G8222 Paraplegia, incomplete: Secondary | ICD-10-CM | POA: Diagnosis not present

## 2015-05-27 DIAGNOSIS — C7951 Secondary malignant neoplasm of bone: Secondary | ICD-10-CM | POA: Diagnosis not present

## 2015-05-27 DIAGNOSIS — N319 Neuromuscular dysfunction of bladder, unspecified: Secondary | ICD-10-CM | POA: Diagnosis not present

## 2015-06-01 ENCOUNTER — Encounter: Payer: Self-pay | Admitting: Physical Medicine & Rehabilitation

## 2015-06-01 ENCOUNTER — Encounter: Payer: Medicare Other | Attending: Physical Medicine & Rehabilitation | Admitting: Physical Medicine & Rehabilitation

## 2015-06-01 VITALS — BP 124/69 | HR 90

## 2015-06-01 DIAGNOSIS — Z853 Personal history of malignant neoplasm of breast: Secondary | ICD-10-CM | POA: Diagnosis not present

## 2015-06-01 DIAGNOSIS — E785 Hyperlipidemia, unspecified: Secondary | ICD-10-CM | POA: Diagnosis not present

## 2015-06-01 DIAGNOSIS — R202 Paresthesia of skin: Secondary | ICD-10-CM | POA: Diagnosis not present

## 2015-06-01 DIAGNOSIS — G9520 Unspecified cord compression: Secondary | ICD-10-CM | POA: Diagnosis not present

## 2015-06-01 DIAGNOSIS — K219 Gastro-esophageal reflux disease without esophagitis: Secondary | ICD-10-CM | POA: Diagnosis not present

## 2015-06-01 DIAGNOSIS — R42 Dizziness and giddiness: Secondary | ICD-10-CM | POA: Diagnosis not present

## 2015-06-01 DIAGNOSIS — R0789 Other chest pain: Secondary | ICD-10-CM | POA: Diagnosis not present

## 2015-06-01 DIAGNOSIS — G9529 Other cord compression: Secondary | ICD-10-CM | POA: Diagnosis not present

## 2015-06-01 DIAGNOSIS — Z87440 Personal history of urinary (tract) infections: Secondary | ICD-10-CM | POA: Diagnosis not present

## 2015-06-01 DIAGNOSIS — M8448XS Pathological fracture, other site, sequela: Secondary | ICD-10-CM | POA: Diagnosis not present

## 2015-06-01 DIAGNOSIS — N319 Neuromuscular dysfunction of bladder, unspecified: Secondary | ICD-10-CM | POA: Diagnosis not present

## 2015-06-01 DIAGNOSIS — D62 Acute posthemorrhagic anemia: Secondary | ICD-10-CM | POA: Diagnosis not present

## 2015-06-01 DIAGNOSIS — G8222 Paraplegia, incomplete: Secondary | ICD-10-CM | POA: Diagnosis not present

## 2015-06-01 DIAGNOSIS — I129 Hypertensive chronic kidney disease with stage 1 through stage 4 chronic kidney disease, or unspecified chronic kidney disease: Secondary | ICD-10-CM | POA: Diagnosis not present

## 2015-06-01 DIAGNOSIS — G8220 Paraplegia, unspecified: Secondary | ICD-10-CM

## 2015-06-01 DIAGNOSIS — M8458XS Pathological fracture in neoplastic disease, other specified site, sequela: Secondary | ICD-10-CM | POA: Diagnosis not present

## 2015-06-01 DIAGNOSIS — G839 Paralytic syndrome, unspecified: Secondary | ICD-10-CM | POA: Diagnosis not present

## 2015-06-01 DIAGNOSIS — Z452 Encounter for adjustment and management of vascular access device: Secondary | ICD-10-CM | POA: Diagnosis not present

## 2015-06-01 DIAGNOSIS — N189 Chronic kidney disease, unspecified: Secondary | ICD-10-CM | POA: Diagnosis not present

## 2015-06-01 DIAGNOSIS — C7951 Secondary malignant neoplasm of bone: Secondary | ICD-10-CM | POA: Diagnosis not present

## 2015-06-01 DIAGNOSIS — F419 Anxiety disorder, unspecified: Secondary | ICD-10-CM | POA: Diagnosis not present

## 2015-06-01 DIAGNOSIS — Z85828 Personal history of other malignant neoplasm of skin: Secondary | ICD-10-CM | POA: Diagnosis not present

## 2015-06-01 DIAGNOSIS — M25511 Pain in right shoulder: Secondary | ICD-10-CM | POA: Diagnosis not present

## 2015-06-01 MED ORDER — GABAPENTIN 600 MG PO TABS: 600.0000 mg | ORAL_TABLET | Freq: Three times a day (TID) | ORAL | Status: DC

## 2015-06-01 NOTE — Progress Notes (Signed)
Subjective:    Patient ID: Heather [name], female    DOB: 10/25/1945, 70 y.o.   MRN: [id number]  HPI   Heather [name] is here in follow up of her thoracic spine compression/paraplegia. She is making steady progress with PT at home. They have requested an orthotic for her. We decided upon an AFO which was started at [facility] last week. She is feeling that her legs are stronger but that her sensation is still very much affected. She is having pain along with the sensory loss as well. The gabapentin has been at the '400mg'$  dose for some time and she's not sure if it's even working any more.   She has gone through onc/rad-onc treatment at the cancer center. She attributes some of her improvement due to the radiation therapy in particular.    Pain Inventory Average Pain 3 Pain Right Now 3 My pain is intermittent  In the last 24 hours, has pain interfered with the following? General activity 0 Relation with others 0 Enjoyment of life 0 What TIME of day is your pain at its worst? night Sleep (in general) NA  Pain is worse with: inactivity Pain improves with: therapy/exercise and medication Relief from Meds: n/a  Mobility use a wheelchair needs help with transfers  Function disabled: date disabled 2012 retired I need assistance with the following:  dressing, toileting, meal prep, household duties and shopping  Neuro/Psych trouble walking  Prior Studies Any changes since last visit?  no  Physicians involved in your care Any changes since last visit?  no   Family History  Problem Relation Age of Onset  . Leukemia Mother   . Colon cancer Brother   . Cancer Brother     Prostate  . Colon cancer Maternal Grandmother   . Stroke Father   . Diabetes Mellitus II Father    Social History   Social History  . Marital Status: Married    Spouse Name: N/A  . Number of Children: 2  . Years of Education: N/A   Social History Main Topics  . Smoking status: Never Smoker   . Smokeless  tobacco: Never Used  . Alcohol Use: No  . Drug Use: No  . Sexual Activity: Yes    Birth Control/ Protection: Post-menopausal   Other Topics Concern  . None   Social History Narrative   Past Surgical History  Procedure Laterality Date  . Cyst removed from left breast  1970  . Left wrist surgery   2004    with plate  . Colonoscopy    . Esophagogastroduodenoscopy    . Mastectomy, radical    . Mastectomy modified radical  05/10/2012    Procedure: MASTECTOMY MODIFIED RADICAL;  Surgeon: Merrie Roof, MD;  Location: [facility];  Service: General;  Laterality: Right;  RIGHT MODIFIED RADICAL MASTECTOMY  . Portacath placement  05/10/2012    Procedure: INSERTION PORT-A-CATH;  Surgeon: Merrie Roof, MD;  Location: [facility];  Service: General;  Laterality: Left;  . Decompressive lumbar laminectomy level 4 N/A 02/15/2015    Procedure: DECOMPRESSION T5 AND T3-T7 STABALIZATION;  Surgeon: Consuella Lose, MD;  Location: [facility];  Service: Neurosurgery;  Laterality: N/A;  . Laminectomy N/A 03/27/2015    Procedure: Thoracic Four-Thoracic Six Laminectomy for tumor;  Surgeon: Consuella Lose, MD;  Location: [facility];  Service: Neurosurgery;  Laterality: N/A;  T4-T6 Laminectomy   Past Medical History  Diagnosis Date  . Chronic kidney disease   . Hx: UTI (  urinary tract infection)   . Right shoulder pain   . Right knee pain   . Hypertension     recently started Aldactone   . Hyperlipidemia     but not on meds;diet and exercise controlled  . Dizziness     has been going on for 64month;medical MD aware  . H/O hiatal hernia   . GERD (gastroesophageal reflux disease)     Tums prn  . Hemorrhoids   . Urinary frequency     d/t taking Aldactone  . History of UTI   . Anemia     hx of  . Cataract     immature-not sure of which eye  . Anxiety     r/t updated surgery  . Insomnia     takes Melatoniin daily  . [place]     right  . Skin cancer   . Hx of radiation therapy 10/25/12-  12/11/12    right chest wall/regional lymph nodes 5040 cGy, 28 sessions, right chest wall boost 1000 cGy 1 session  . H. pylori infection   . Metastasis to spinal column ([facility])     T5  . Complication of anesthesia     pt states she is sensitive to meds  . PONV (postoperative nausea and vomiting)     pt experienced hair loss, confusion and combative- 02/15/15   BP 124/69 mmHg  Pulse 90  SpO2 99%  Opioid Risk Score:   Fall Risk Score:  `1  Depression screen PHQ 2/9  [facility] 06/01/2015 05/08/2015 04/07/2015  Decreased Interest 1 0 1  Down, Depressed, Hopeless 1 0 1  PHQ - 2 Score 2 0 2  Altered sleeping - - 0  Tired, decreased energy - - 1  Change in appetite - - 0  Feeling bad or failure about yourself  - - 1  Trouble concentrating - - 1  Moving slowly or fidgety/restless - - 0  Suicidal thoughts - - 0  PHQ-9 Score - - 5  Difficult doing work/chores - - Not difficult at all    Review of Systems  Cardiovascular: Positive for leg swelling.  All other systems reviewed and are negative.      Objective:   Physical Exam  Constitutional: She appears well-developed and well-nourished. No distress.  HENT: dentition intact.  Head: Normocephalic and atraumatic.  Mouth/Throat: Oropharynx is clear and moist. No obvious lesions  Eyes: Conjunctivae and EOM are normal. Conj WNL  Neck: Normal range of motion. Neck supple.  Cardiovascular: Normal rate and regular rhythm.  No murmur heard.  Respiratory: Effort normal and breath sounds normal.  GI: Soft. Bowel sounds are normal. She exhibits no distension. There is no tenderness.  Musculoskeletal: She exhibits no tenderness. Trace edema in feet Neurological: She is alert and oriented. Strength UE grossly 4+/5 with inhibition proximally due to pain. LLE-- hip flexion 3 to 3+/5, KE and KF 3+/5 ankle dorsiflexion 3+/5, plantar flexion 4+/5, RLE--- 2+/5HF, 2+KE, 2/+5 ADF/APF  Sensation remains decreased below T5-6, No  resting tone. DTR's 3+ bilateral LE's.  Skin: Skin is warm and dry. No rash noted. No erythema. Surgical incision closed/dry Psych: anxiety    Assessment & Plan:   Medical Problem List and Plan:  1. Functional deficits secondary to T5 cord compression/myelopathy from pathologic compression fracture, metastatic breast cancer. Repeat decompression 03/22/15  -[facility]---advance to outpt therapies at neuro-rehab over the next month or so -will order a new cushion, air cushion--roho per [place]  2. DVT Prophylaxis/Anticoagulation:  eliquis    3. Pain Management: . Continue tramadol prn, robaxin prn.  -increase gabapentin to '600mg'$  TID for dysesthesias 4. ABLA/Onc:    -ongoing plan per onc/rad-onc teams  5. Neurogenic bowel and bladder: emptying bowels.  -continue a timed schedule with bladder emptying and a qam bowel program   Thirty minutes of face to face patient care time were spent during this visit. All questions were encouraged and answered. Follow up in 2 months.

## 2015-06-01 NOTE — Patient Instructions (Signed)
  PLEASE CALL ME WITH ANY PROBLEMS OR QUESTIONS (#[phone]).      

## 2015-06-02 DIAGNOSIS — N319 Neuromuscular dysfunction of bladder, unspecified: Secondary | ICD-10-CM | POA: Diagnosis not present

## 2015-06-02 DIAGNOSIS — G8222 Paraplegia, incomplete: Secondary | ICD-10-CM | POA: Diagnosis not present

## 2015-06-02 DIAGNOSIS — C7951 Secondary malignant neoplasm of bone: Secondary | ICD-10-CM | POA: Diagnosis not present

## 2015-06-02 DIAGNOSIS — G9529 Other cord compression: Secondary | ICD-10-CM | POA: Diagnosis not present

## 2015-06-02 DIAGNOSIS — M8458XS Pathological fracture in neoplastic disease, other specified site, sequela: Secondary | ICD-10-CM | POA: Diagnosis not present

## 2015-06-02 DIAGNOSIS — Z87440 Personal history of urinary (tract) infections: Secondary | ICD-10-CM | POA: Diagnosis not present

## 2015-06-03 ENCOUNTER — Telehealth: Payer: Self-pay

## 2015-06-03 DIAGNOSIS — M8458XS Pathological fracture in neoplastic disease, other specified site, sequela: Secondary | ICD-10-CM | POA: Diagnosis not present

## 2015-06-03 DIAGNOSIS — C7951 Secondary malignant neoplasm of bone: Secondary | ICD-10-CM | POA: Diagnosis not present

## 2015-06-03 DIAGNOSIS — G9529 Other cord compression: Secondary | ICD-10-CM | POA: Diagnosis not present

## 2015-06-03 DIAGNOSIS — G8222 Paraplegia, incomplete: Secondary | ICD-10-CM | POA: Diagnosis not present

## 2015-06-03 DIAGNOSIS — Z87440 Personal history of urinary (tract) infections: Secondary | ICD-10-CM | POA: Diagnosis not present

## 2015-06-03 DIAGNOSIS — N319 Neuromuscular dysfunction of bladder, unspecified: Secondary | ICD-10-CM | POA: Diagnosis not present

## 2015-06-03 NOTE — Telephone Encounter (Signed)
Heather [name] with [facility]-states that the pt fell this morning. There is no apparent injury.

## 2015-06-04 DIAGNOSIS — G9529 Other cord compression: Secondary | ICD-10-CM | POA: Diagnosis not present

## 2015-06-04 DIAGNOSIS — C7951 Secondary malignant neoplasm of bone: Secondary | ICD-10-CM | POA: Diagnosis not present

## 2015-06-04 DIAGNOSIS — Z87440 Personal history of urinary (tract) infections: Secondary | ICD-10-CM | POA: Diagnosis not present

## 2015-06-04 DIAGNOSIS — G8222 Paraplegia, incomplete: Secondary | ICD-10-CM | POA: Diagnosis not present

## 2015-06-04 DIAGNOSIS — M8458XS Pathological fracture in neoplastic disease, other specified site, sequela: Secondary | ICD-10-CM | POA: Diagnosis not present

## 2015-06-04 DIAGNOSIS — N319 Neuromuscular dysfunction of bladder, unspecified: Secondary | ICD-10-CM | POA: Diagnosis not present

## 2015-06-05 DIAGNOSIS — M8458XS Pathological fracture in neoplastic disease, other specified site, sequela: Secondary | ICD-10-CM | POA: Diagnosis not present

## 2015-06-05 DIAGNOSIS — Z87440 Personal history of urinary (tract) infections: Secondary | ICD-10-CM | POA: Diagnosis not present

## 2015-06-05 DIAGNOSIS — G8222 Paraplegia, incomplete: Secondary | ICD-10-CM | POA: Diagnosis not present

## 2015-06-05 DIAGNOSIS — C7951 Secondary malignant neoplasm of bone: Secondary | ICD-10-CM | POA: Diagnosis not present

## 2015-06-05 DIAGNOSIS — G9529 Other cord compression: Secondary | ICD-10-CM | POA: Diagnosis not present

## 2015-06-05 DIAGNOSIS — N319 Neuromuscular dysfunction of bladder, unspecified: Secondary | ICD-10-CM | POA: Diagnosis not present

## 2015-06-08 DIAGNOSIS — M8458XS Pathological fracture in neoplastic disease, other specified site, sequela: Secondary | ICD-10-CM | POA: Diagnosis not present

## 2015-06-08 DIAGNOSIS — N319 Neuromuscular dysfunction of bladder, unspecified: Secondary | ICD-10-CM | POA: Diagnosis not present

## 2015-06-08 DIAGNOSIS — G8222 Paraplegia, incomplete: Secondary | ICD-10-CM | POA: Diagnosis not present

## 2015-06-08 DIAGNOSIS — Z87440 Personal history of urinary (tract) infections: Secondary | ICD-10-CM | POA: Diagnosis not present

## 2015-06-08 DIAGNOSIS — G9529 Other cord compression: Secondary | ICD-10-CM | POA: Diagnosis not present

## 2015-06-08 DIAGNOSIS — C7951 Secondary malignant neoplasm of bone: Secondary | ICD-10-CM | POA: Diagnosis not present

## 2015-06-09 DIAGNOSIS — Z87440 Personal history of urinary (tract) infections: Secondary | ICD-10-CM | POA: Diagnosis not present

## 2015-06-09 DIAGNOSIS — C7951 Secondary malignant neoplasm of bone: Secondary | ICD-10-CM | POA: Diagnosis not present

## 2015-06-09 DIAGNOSIS — G9529 Other cord compression: Secondary | ICD-10-CM | POA: Diagnosis not present

## 2015-06-09 DIAGNOSIS — M8458XS Pathological fracture in neoplastic disease, other specified site, sequela: Secondary | ICD-10-CM | POA: Diagnosis not present

## 2015-06-09 DIAGNOSIS — N319 Neuromuscular dysfunction of bladder, unspecified: Secondary | ICD-10-CM | POA: Diagnosis not present

## 2015-06-09 DIAGNOSIS — G8222 Paraplegia, incomplete: Secondary | ICD-10-CM | POA: Diagnosis not present

## 2015-06-10 DIAGNOSIS — C7951 Secondary malignant neoplasm of bone: Secondary | ICD-10-CM | POA: Diagnosis not present

## 2015-06-10 DIAGNOSIS — G8222 Paraplegia, incomplete: Secondary | ICD-10-CM | POA: Diagnosis not present

## 2015-06-10 DIAGNOSIS — Z87440 Personal history of urinary (tract) infections: Secondary | ICD-10-CM | POA: Diagnosis not present

## 2015-06-10 DIAGNOSIS — M8458XS Pathological fracture in neoplastic disease, other specified site, sequela: Secondary | ICD-10-CM | POA: Diagnosis not present

## 2015-06-10 DIAGNOSIS — G9529 Other cord compression: Secondary | ICD-10-CM | POA: Diagnosis not present

## 2015-06-10 DIAGNOSIS — N319 Neuromuscular dysfunction of bladder, unspecified: Secondary | ICD-10-CM | POA: Diagnosis not present

## 2015-06-10 NOTE — Assessment & Plan Note (Signed)
Right breast invasive ductal carcinoma ER positive PR negative HER-2 positive Ki-67 44% multifocal disease 3/7 lymph nodes positive T2 N1 M0 stage IIB status post adjuvant chemotherapy with TCH followed by Herceptin maintenance, adjuvant radiation therapy, currently on letrozole since 02/07/2013. MRI 02/14/2015: T5 large destructive lesion with pathologic compression fracture and extensive epidural tumor, involvement of T4, excision metastatic carcinoma ER 60%, PR 0%, HER-2 positive ratio 2.71 average copy #7.45 PET CT 03/18/2015:Residual enhancing soft tissue adjacent to the spinal cord. No evidence of metastatic disease. Nonspecific uptake the left nipple. 03/27/2015: T4-6 decompression surgery for spinal cord compression Radiation therapy to the spine (Dr. Manning) started 12/05/2016completed 05/19/2015  Recommendation: 1. Resumed anastrozole 05/21/2015 2. Herceptin every 3 week started 04/10/2015 3. Obtain echocardiograms every 3 months 4. Bone metastases: Patient will receive Zometa every 3 weeks with calcium and vitamin D  Zometa toxicities: Patient felt fatigued to 3 days after receiving Zometa. If it causes persistent symptoms, we might switch her Zometa every 3 months.  Hypoproteinemia: patient trying to increase her dietary protein And her albumin level significant increase of 3.4  Anemia of chronic disease:Hemoglobin is slowly improving. Today's hemoglobin is 9.5 g Goal of treatment: Palliation  Return to clinic in 6 weeks And every 3 weeks for Herceptin 

## 2015-06-11 ENCOUNTER — Ambulatory Visit: Payer: Medicare Other

## 2015-06-11 ENCOUNTER — Other Ambulatory Visit (HOSPITAL_BASED_OUTPATIENT_CLINIC_OR_DEPARTMENT_OTHER): Payer: Medicare Other

## 2015-06-11 ENCOUNTER — Telehealth: Payer: Self-pay | Admitting: Hematology and Oncology

## 2015-06-11 ENCOUNTER — Encounter: Payer: Self-pay | Admitting: Hematology and Oncology

## 2015-06-11 ENCOUNTER — Ambulatory Visit (HOSPITAL_BASED_OUTPATIENT_CLINIC_OR_DEPARTMENT_OTHER): Payer: Medicare Other

## 2015-06-11 ENCOUNTER — Ambulatory Visit (HOSPITAL_BASED_OUTPATIENT_CLINIC_OR_DEPARTMENT_OTHER): Payer: Medicare Other | Admitting: Hematology and Oncology

## 2015-06-11 VITALS — BP 123/66 | HR 83 | Temp 98.2°F | Resp 18 | Ht 64.0 in | Wt 146.6 lb

## 2015-06-11 DIAGNOSIS — C50411 Malignant neoplasm of upper-outer quadrant of right female breast: Secondary | ICD-10-CM | POA: Diagnosis not present

## 2015-06-11 DIAGNOSIS — C7951 Secondary malignant neoplasm of bone: Secondary | ICD-10-CM

## 2015-06-11 DIAGNOSIS — Z79811 Long term (current) use of aromatase inhibitors: Secondary | ICD-10-CM | POA: Diagnosis not present

## 2015-06-11 DIAGNOSIS — Z170 Estrogen receptor positive status [ER+]: Secondary | ICD-10-CM

## 2015-06-11 DIAGNOSIS — C773 Secondary and unspecified malignant neoplasm of axilla and upper limb lymph nodes: Secondary | ICD-10-CM | POA: Diagnosis not present

## 2015-06-11 DIAGNOSIS — Z5112 Encounter for antineoplastic immunotherapy: Secondary | ICD-10-CM | POA: Diagnosis not present

## 2015-06-11 DIAGNOSIS — Z95828 Presence of other vascular implants and grafts: Secondary | ICD-10-CM

## 2015-06-11 DIAGNOSIS — D638 Anemia in other chronic diseases classified elsewhere: Secondary | ICD-10-CM

## 2015-06-11 DIAGNOSIS — G8220 Paraplegia, unspecified: Secondary | ICD-10-CM

## 2015-06-11 DIAGNOSIS — C50911 Malignant neoplasm of unspecified site of right female breast: Secondary | ICD-10-CM

## 2015-06-11 LAB — CBC WITH DIFFERENTIAL/PLATELET
BASO%: 0.4 % (ref 0.0–2.0)
Basophils Absolute: 0 10*3/uL (ref 0.0–0.1)
EOS%: 0.8 % (ref 0.0–7.0)
Eosinophils Absolute: 0 10*3/uL (ref 0.0–0.5)
HCT: 34.6 % — ABNORMAL LOW (ref 34.8–46.6)
HGB: 10.6 g/dL — ABNORMAL LOW (ref 11.6–15.9)
LYMPH%: 9.6 % — ABNORMAL LOW (ref 14.0–49.7)
MCH: 25.4 pg (ref 25.1–34.0)
MCHC: 30.6 g/dL — ABNORMAL LOW (ref 31.5–36.0)
MCV: 83 fL (ref 79.5–101.0)
MONO#: 0.4 10*3/uL (ref 0.1–0.9)
MONO%: 9.2 % (ref 0.0–14.0)
NEUT#: 3.8 10*3/uL (ref 1.5–6.5)
NEUT%: 80 % — ABNORMAL HIGH (ref 38.4–76.8)
Platelets: 252 10*3/uL (ref 145–400)
RBC: 4.17 10*6/uL (ref 3.70–5.45)
RDW: 16.1 % — ABNORMAL HIGH (ref 11.2–14.5)
WBC: 4.8 10*3/uL (ref 3.9–10.3)
lymph#: 0.5 10*3/uL — ABNORMAL LOW (ref 0.9–3.3)
nRBC: 0 % (ref 0–0)

## 2015-06-11 LAB — COMPREHENSIVE METABOLIC PANEL
ALT: 13 U/L (ref 0–55)
AST: 21 U/L (ref 5–34)
Albumin: 3.5 g/dL (ref 3.5–5.0)
Alkaline Phosphatase: 73 U/L (ref 40–150)
Anion Gap: 10 mEq/L (ref 3–11)
BUN: 12 mg/dL (ref 7.0–26.0)
CO2: 25 mEq/L (ref 22–29)
Calcium: 9.1 mg/dL (ref 8.4–10.4)
Chloride: 104 mEq/L (ref 98–109)
Creatinine: 0.7 mg/dL (ref 0.6–1.1)
EGFR: 85 mL/min/{1.73_m2} — ABNORMAL LOW (ref >90)
Glucose: 100 mg/dl (ref 70–140)
Potassium: 4.1 mEq/L (ref 3.5–5.1)
Sodium: 139 mEq/L (ref 136–145)
Total Bilirubin: 0.45 mg/dL (ref 0.20–1.20)
Total Protein: 7 g/dL (ref 6.4–8.3)

## 2015-06-11 MED ORDER — DIPHENHYDRAMINE HCL 25 MG PO CAPS
25.0000 mg | ORAL_CAPSULE | Freq: Once | ORAL | Status: AC
  Administered 2015-06-11: 25 mg via ORAL

## 2015-06-11 MED ORDER — TRASTUZUMAB CHEMO INJECTION 440 MG: 6.0000 mg/kg | Freq: Once | INTRAVENOUS | Status: DC

## 2015-06-11 MED ORDER — SODIUM CHLORIDE 0.9 % IV SOLN
Freq: Once | INTRAVENOUS | Status: AC
  Administered 2015-06-11: 11:00:00 via INTRAVENOUS

## 2015-06-11 MED ORDER — HEPARIN SOD (PORK) LOCK FLUSH 100 UNIT/ML IV SOLN
500.0000 [IU] | Freq: Once | INTRAVENOUS | Status: AC | PRN
  Administered 2015-06-11: 500 [IU]
  Filled 2015-06-11: qty 5

## 2015-06-11 MED ORDER — ZOLEDRONIC ACID 4 MG/100ML IV SOLN
4.0000 mg | Freq: Once | INTRAVENOUS | Status: AC
  Administered 2015-06-11: 4 mg via INTRAVENOUS
  Filled 2015-06-11: qty 100

## 2015-06-11 MED ORDER — DIPHENHYDRAMINE HCL 25 MG PO CAPS
ORAL_CAPSULE | ORAL | Status: AC
  Filled 2015-06-11: qty 1

## 2015-06-11 MED ORDER — ACETAMINOPHEN 325 MG PO TABS
650.0000 mg | ORAL_TABLET | Freq: Once | ORAL | Status: AC
  Administered 2015-06-11: 650 mg via ORAL

## 2015-06-11 MED ORDER — SODIUM CHLORIDE 0.9% FLUSH
10.0000 mL | INTRAVENOUS | Status: DC | PRN
  Administered 2015-06-11: 10 mL via INTRAVENOUS
  Filled 2015-06-11: qty 10

## 2015-06-11 MED ORDER — SODIUM CHLORIDE 0.9 % IJ SOLN
10.0000 mL | INTRAMUSCULAR | Status: DC | PRN
  Administered 2015-06-11: 10 mL
  Filled 2015-06-11: qty 10

## 2015-06-11 MED ORDER — TRASTUZUMAB CHEMO INJECTION 440 MG
6.0000 mg/kg | Freq: Once | INTRAVENOUS | Status: AC
  Administered 2015-06-11: 399 mg via INTRAVENOUS
  Filled 2015-06-11: qty 19

## 2015-06-11 MED ORDER — ACETAMINOPHEN 325 MG PO TABS
ORAL_TABLET | ORAL | Status: AC
  Filled 2015-06-11: qty 2

## 2015-06-11 NOTE — Telephone Encounter (Signed)
Appointments made and avs printed for patient,echo to [facility] for precert

## 2015-06-11 NOTE — Progress Notes (Signed)
Patient Care Team: [name], MD as PCP - General (Family Medicine)  DIAGNOSIS: Primary cancer of upper outer quadrant of right female breast [facility])   Staging form: Breast, AJCC 7th Edition     Clinical: Stage IIB (T2, N1, cM0) - Unsigned       Staging comments: Staged at breast conference 11.6.13      Pathologic: No stage assigned - Unsigned  SUMMARY OF ONCOLOGIC HISTORY:   Primary cancer of upper outer quadrant of right female breast ([facility])   03/08/2012 Initial Diagnosis Right breast invasive ductal carcinoma ER positive PR negative HER-2 positive Ki-67 44%; another breast mass biopsied in the anterior part of the breast which was also positive for malignancy that was HER-2 negative   05/10/2012 Surgery Right mastectomy and axillary lymph node dissection: Multifocal disease 5 cm, 1.7 cm, 1.6 cm, ER positive PR negative HER-2 positive Ki-67 44%, 3/7 lymph nodes positive   06/14/2012 - 06/12/2013 Chemotherapy Adjuvant chemotherapy with [place] 6 followed by Herceptin maintenance   10/25/2012 - 12/11/2012 Radiation Therapy Adjuvant radiation therapy   02/07/2013 -  Anti-estrogen oral therapy Letrozole 2.5 mg daily   02/14/2015 Imaging MRI spine: Large destructive T5 lesion with severe pathologic compression fracture and extensive epidural tumor, severe spinal stenosis with moderate cord compression, tumor involvement of T4   03/18/2015 PET scan Residual enhancing soft tissue adjacent to the spinal cord. No evidence of metastatic disease. Nonspecific uptake the left nipple   03/27/2015 - 03/30/2015 Hospital Admission T4-6 decompression for spinal cord compression (lower extremity paralysis)   04/10/2015 -  Chemotherapy Palliative treatment with Herceptin every 3 weeks   04/13/2015 - 05/19/2015 Radiation Therapy Palliative radiation treatment to the spine    CHIEF COMPLIANT: follow-up on Herceptin anastrozole  INTERVAL HISTORY: Heather [name] is a 70 year old with above-mentioned history of cord  compression with lower extremity paralysis her metastatic breast cancer who is currently on palliative treatment with Herceptin and anastrozole. She reports that the anastomosis causing muscle soreness. Especially when she works out a lot trying to strangle herself it appears that her muscles are hurting. She especially is concerned about the left breast discomfort that comes and goes when she does physical activity. In fact may be the pectoral muscle underneath the breasts. She also gets bone pain related to Zometa accompanied by fatigue.  REVIEW OF SYSTEMS:   Constitutional: Denies fevers, chills or abnormal weight loss Eyes: Denies blurriness of vision Ears, nose, mouth, throat, and face: Denies mucositis or sore throat Respiratory: Denies cough, dyspnea or wheezes Cardiovascular: Denies palpitation, chest discomfort Gastrointestinal:  Denies nausea, heartburn or change in bowel habits Skin: bruising on the dorsum of the right foot Lymphatics: Denies new lymphadenopathy or easy bruising Neurological:lower extremity complete paralysis, decreased sensation below her waist Behavioral/Psych: Mood is stable, no new changes  Extremities: No lower extremity edema Breast: intermittent breast pains All other systems were reviewed with the patient and are negative.  I have reviewed the past medical history, past surgical history, social history and family history with the patient and they are unchanged from previous note.  ALLERGIES:  is allergic to codeine; keflex; naprosyn; oxycodone; and tussin.  MEDICATIONS:  Current Outpatient Prescriptions  Medication Sig Dispense Refill  . anastrozole (ARIMIDEX) 1 MG tablet Take 1 tablet by mouth daily.    Marland Kitchen apixaban (ELIQUIS) 2.5 MG TABS tablet Take 1 tablet (2.5 mg total) by mouth 2 (two) times daily. 180 tablet 0  . b complex vitamins tablet Take 1 tablet by  mouth daily.    . Calcium Carbonate Antacid (TUMS PO) Take 2 tablets by mouth 2 (two) times  daily as needed (acid reflux).    . Calcium Carbonate-Vitamin D (CALCIUM-D PO) Take 1 tablet by mouth daily.    . Cholecalciferol (VITAMIN D) 2000 UNITS tablet Take 2,000 Units by mouth daily.    . Coenzyme Q10 (COQ10) 100 MG CAPS Take 100 mg by mouth at bedtime.    Marland Kitchen dexamethasone (DECADRON) 1 MG tablet Take 1 tablet (1 mg total) by mouth 2 (two) times daily with a meal. 68m PO BID x 5 days then 129mPO BID x 5 days, then off. 10 tablet 0  . gabapentin (NEURONTIN) 400 MG capsule Take 400 mg by mouth.    . gabapentin (NEURONTIN) 600 MG tablet Take 1 tablet (600 mg total) by mouth 3 (three) times daily. 90 tablet 3  . lidocaine-prilocaine (EMLA) cream Apply to affected area once (Patient taking differently: Apply 1 application topically every 21 ( twenty-one) days. Apply to port prior to infusions) 30 g 3  . methocarbamol (ROBAXIN) 500 MG tablet Take 1 tablet (500 mg total) by mouth daily as needed for muscle spasms. 75 tablet 0  . OVER THE COUNTER MEDICATION Place 1 drop into both eyes 2 (two) times daily as needed (dry eyes). Over the counter lubricating eye drops    . polyethylene glycol (MIRALAX / GLYCOLAX) packet Take 17 g by mouth 2 (two) times daily. (Patient taking differently: Take 8.5 g by mouth daily as needed (constipation). ) 100 each 0  . pravastatin (PRAVACHOL) 10 MG tablet Take 10 mg by mouth at bedtime.     . Probiotic Product (PROBIOTIC DAILY PO) Take 1 capsule by mouth 2 (two) times daily.     . prochlorperazine (COMPAZINE) 10 MG tablet Take 1 tablet (10 mg total) by mouth every 6 (six) hours as needed for nausea or vomiting. 30 tablet 0  . ranitidine (ZANTAC) 300 MG tablet Take 300 mg by mouth at bedtime.     . Marland Kitchenpironolactone (ALDACTONE) 25 MG tablet Take 0.5 tablets (12.5 mg total) by mouth daily. 45 tablet 12  . sucralfate (CARAFATE) 1 G tablet Take 1 tablet (1 g total) by mouth 4 (four) times daily. 120 tablet 2  . traMADol (ULTRAM) 50 MG tablet Take 1 tablet (50 mg total) by  mouth every 6 (six) hours as needed for moderate pain. 60 tablet 0  . Wound Cleansers (RADIAPLEX EX) Apply topically.     No current facility-administered medications for this visit.   Facility-Administered Medications Ordered in Other Visits  Medication Dose Route Frequency Provider Last Rate Last Dose  . sodium chloride flush (NS) 0.9 % injection 10 mL  10 mL Intravenous PRN ViNicholas LoseMD   10 mL at 06/11/15 0957    PHYSICAL EXAMINATION: ECOG PERFORMANCE STATUS: 1 - Symptomatic but completely ambulatory  Filed Vitals:   06/11/15 1030  BP: 123/66  Pulse: 83  Temp: 98.2 F (36.8 C)  Resp: 18   Filed Weights   06/11/15 1030  Weight: 146 lb 9.6 oz (66.497 kg)    GENERAL:alert, no distress and comfortable SKIN: bruise on the dorsum of the right foot EYES: normal, Conjunctiva are pink and non-injected, sclera clear OROPHARYNX:no exudate, no erythema and lips, buccal mucosa, and tongue normal  NECK: supple, thyroid normal size, non-tender, without nodularity LYMPH:  no palpable lymphadenopathy in the cervical, axillary or inguinal LUNGS: clear to auscultation and percussion with normal breathing effort HEART:  regular rate & rhythm and no murmurs and no lower extremity edema ABDOMEN:abdomen soft, non-tender and normal bowel sounds MUSCULOSKELETAL:no cyanosis of digits and no clubbing  NEURO: paralysis of lower extremities related to cord compression. EXTREMITIES: No lower extremity edema  LABORATORY DATA:  I have reviewed the data as listed   Chemistry      Component Value Date/Time   NA 139 06/11/2015 0917   NA 135 03/28/2015 0446   K 4.1 06/11/2015 0917   K 3.8 03/28/2015 0446   CL 99* 03/28/2015 0446   CL 101 10/25/2012 0946   CO2 25 06/11/2015 0917   CO2 28 03/28/2015 0446   BUN 12.0 06/11/2015 0917   BUN 24* 03/28/2015 0446   CREATININE 0.7 06/11/2015 0917   CREATININE 0.78 03/28/2015 0446      Component Value Date/Time   CALCIUM 9.1 06/11/2015 0917    CALCIUM 7.8* 03/28/2015 0446   ALKPHOS 73 06/11/2015 0917   ALKPHOS 124 03/27/2015 1434   AST 21 06/11/2015 0917   AST 42* 03/27/2015 1434   ALT 13 06/11/2015 0917   ALT 136* 03/27/2015 1434   BILITOT 0.45 06/11/2015 0917   BILITOT 0.9 03/27/2015 1434       Lab Results  Component Value Date   WBC 4.8 06/11/2015   HGB 10.6* 06/11/2015   HCT 34.6* 06/11/2015   MCV 83.0 06/11/2015   PLT 252 06/11/2015   NEUTROABS 3.8 06/11/2015   ASSESSMENT & PLAN:  Primary cancer of upper outer quadrant of right female breast ([place]) Right breast invasive ductal carcinoma ER positive PR negative HER-2 positive Ki-67 44% multifocal disease 3/7 lymph nodes positive T2 N1 M0 stage IIB status post adjuvant chemotherapy with TCH followed by Herceptin maintenance, adjuvant radiation therapy, currently on letrozole since 02/07/2013. MRI 02/14/2015: T5 large destructive lesion with pathologic compression fracture and extensive epidural tumor, involvement of T4, excision metastatic carcinoma ER 60%, PR 0%, HER-2 positive ratio 2.71 average copy #7.45 PET CT 03/18/2015:Residual enhancing soft tissue adjacent to the spinal cord. No evidence of metastatic disease. Nonspecific uptake the left nipple. 03/27/2015: T4-6 decompression surgery for spinal cord compression Radiation therapy to the spine (Dr. [name]) started 12/05/2016completed 05/19/2015  Recommendation: 1. Resumed anastrozole 05/21/2015 2. Herceptin every 3 week started 04/10/2015 3. Obtain echocardiograms every 3 months 4. Bone metastases: Patient past been receiving  Zometa every 3 weeks with calcium and vitamin D: Patient gets bone pain related to this. If she continues to have these symptoms I might change her treatment to every 3 months. Zometa toxicities: bone pain and fatigue. If it causes persistent symptoms, we might switch her Zometa every 3 months.  Hypoproteinemia: patient trying to increase her dietary protein And her albumin level  significant increase of 3.4  Anemia of chronic disease:Hemoglobin is slowly improving. Today's hemoglobin is 10.6 g Goal of treatment: Palliation Echocardiogram will need to be ordered for cardiac monitoring on Herceptin  Return to clinic in 6 weeks And every 3 weeks for Herceptin The patient has a good understanding of the overall plan. she agrees with it. she will call with any problems that may develop before the next visit here.   Rulon Eisenmenger, MD 06/11/2015

## 2015-06-11 NOTE — Patient Instructions (Signed)

## 2015-06-11 NOTE — Patient Instructions (Signed)
Heather [name] Discharge Instructions for Patients Receiving Chemotherapy  Today you received the following chemotherapy agents Zometa and Herceptin.  To help prevent nausea and vomiting after your treatment, we encourage you to take your nausea medication as directed.   If you develop nausea and vomiting that is not controlled by your nausea medication, call the clinic.   BELOW ARE SYMPTOMS THAT SHOULD BE REPORTED IMMEDIATELY:  *FEVER GREATER THAN 100.5 F  *CHILLS WITH OR WITHOUT FEVER  NAUSEA AND VOMITING THAT IS NOT CONTROLLED WITH YOUR NAUSEA MEDICATION  *UNUSUAL SHORTNESS OF BREATH  *UNUSUAL BRUISING OR BLEEDING  TENDERNESS IN MOUTH AND THROAT WITH OR WITHOUT PRESENCE OF ULCERS  *URINARY PROBLEMS  *BOWEL PROBLEMS  UNUSUAL RASH Items with * indicate a potential emergency and should be followed up as soon as possible.  Feel free to call the clinic you have any questions or concerns. The clinic phone number is [phone].  Please show the [name] at check-in to the Emergency Department and triage nurse.

## 2015-06-12 DIAGNOSIS — G8222 Paraplegia, incomplete: Secondary | ICD-10-CM | POA: Diagnosis not present

## 2015-06-12 DIAGNOSIS — N319 Neuromuscular dysfunction of bladder, unspecified: Secondary | ICD-10-CM | POA: Diagnosis not present

## 2015-06-12 DIAGNOSIS — G9529 Other cord compression: Secondary | ICD-10-CM | POA: Diagnosis not present

## 2015-06-12 DIAGNOSIS — Z87440 Personal history of urinary (tract) infections: Secondary | ICD-10-CM | POA: Diagnosis not present

## 2015-06-12 DIAGNOSIS — M8458XS Pathological fracture in neoplastic disease, other specified site, sequela: Secondary | ICD-10-CM | POA: Diagnosis not present

## 2015-06-12 DIAGNOSIS — C7951 Secondary malignant neoplasm of bone: Secondary | ICD-10-CM | POA: Diagnosis not present

## 2015-06-15 DIAGNOSIS — C7951 Secondary malignant neoplasm of bone: Secondary | ICD-10-CM | POA: Diagnosis not present

## 2015-06-15 DIAGNOSIS — Z87440 Personal history of urinary (tract) infections: Secondary | ICD-10-CM | POA: Diagnosis not present

## 2015-06-15 DIAGNOSIS — M8458XS Pathological fracture in neoplastic disease, other specified site, sequela: Secondary | ICD-10-CM | POA: Diagnosis not present

## 2015-06-15 DIAGNOSIS — N319 Neuromuscular dysfunction of bladder, unspecified: Secondary | ICD-10-CM | POA: Diagnosis not present

## 2015-06-15 DIAGNOSIS — G9529 Other cord compression: Secondary | ICD-10-CM | POA: Diagnosis not present

## 2015-06-15 DIAGNOSIS — G8222 Paraplegia, incomplete: Secondary | ICD-10-CM | POA: Diagnosis not present

## 2015-06-16 ENCOUNTER — Telehealth: Payer: Self-pay | Admitting: *Deleted

## 2015-06-16 DIAGNOSIS — G8220 Paraplegia, unspecified: Secondary | ICD-10-CM

## 2015-06-16 DIAGNOSIS — G9529 Other cord compression: Secondary | ICD-10-CM | POA: Diagnosis not present

## 2015-06-16 DIAGNOSIS — Z87440 Personal history of urinary (tract) infections: Secondary | ICD-10-CM | POA: Diagnosis not present

## 2015-06-16 DIAGNOSIS — C7951 Secondary malignant neoplasm of bone: Secondary | ICD-10-CM | POA: Diagnosis not present

## 2015-06-16 DIAGNOSIS — M8458XS Pathological fracture in neoplastic disease, other specified site, sequela: Secondary | ICD-10-CM | POA: Diagnosis not present

## 2015-06-16 DIAGNOSIS — G8222 Paraplegia, incomplete: Secondary | ICD-10-CM | POA: Diagnosis not present

## 2015-06-16 DIAGNOSIS — N319 Neuromuscular dysfunction of bladder, unspecified: Secondary | ICD-10-CM | POA: Diagnosis not present

## 2015-06-16 NOTE — Telephone Encounter (Signed)
[name] PT [facility] called and they will be finishing up with Heather [name] and she needs referral to outpt rehab PT for next week.  Order placed.

## 2015-06-17 DIAGNOSIS — G8222 Paraplegia, incomplete: Secondary | ICD-10-CM | POA: Diagnosis not present

## 2015-06-17 DIAGNOSIS — N319 Neuromuscular dysfunction of bladder, unspecified: Secondary | ICD-10-CM | POA: Diagnosis not present

## 2015-06-17 DIAGNOSIS — C7951 Secondary malignant neoplasm of bone: Secondary | ICD-10-CM | POA: Diagnosis not present

## 2015-06-17 DIAGNOSIS — M8458XS Pathological fracture in neoplastic disease, other specified site, sequela: Secondary | ICD-10-CM | POA: Diagnosis not present

## 2015-06-17 DIAGNOSIS — Z87440 Personal history of urinary (tract) infections: Secondary | ICD-10-CM | POA: Diagnosis not present

## 2015-06-17 DIAGNOSIS — G9529 Other cord compression: Secondary | ICD-10-CM | POA: Diagnosis not present

## 2015-06-18 DIAGNOSIS — G8222 Paraplegia, incomplete: Secondary | ICD-10-CM | POA: Diagnosis not present

## 2015-06-18 DIAGNOSIS — M8458XS Pathological fracture in neoplastic disease, other specified site, sequela: Secondary | ICD-10-CM | POA: Diagnosis not present

## 2015-06-18 DIAGNOSIS — C7951 Secondary malignant neoplasm of bone: Secondary | ICD-10-CM | POA: Diagnosis not present

## 2015-06-18 DIAGNOSIS — N319 Neuromuscular dysfunction of bladder, unspecified: Secondary | ICD-10-CM | POA: Diagnosis not present

## 2015-06-18 DIAGNOSIS — Z87440 Personal history of urinary (tract) infections: Secondary | ICD-10-CM | POA: Diagnosis not present

## 2015-06-18 DIAGNOSIS — G9529 Other cord compression: Secondary | ICD-10-CM | POA: Diagnosis not present

## 2015-06-19 DIAGNOSIS — Z87440 Personal history of urinary (tract) infections: Secondary | ICD-10-CM | POA: Diagnosis not present

## 2015-06-19 DIAGNOSIS — C7951 Secondary malignant neoplasm of bone: Secondary | ICD-10-CM | POA: Diagnosis not present

## 2015-06-19 DIAGNOSIS — G8222 Paraplegia, incomplete: Secondary | ICD-10-CM | POA: Diagnosis not present

## 2015-06-19 DIAGNOSIS — G9529 Other cord compression: Secondary | ICD-10-CM | POA: Diagnosis not present

## 2015-06-19 DIAGNOSIS — M8458XS Pathological fracture in neoplastic disease, other specified site, sequela: Secondary | ICD-10-CM | POA: Diagnosis not present

## 2015-06-19 DIAGNOSIS — N319 Neuromuscular dysfunction of bladder, unspecified: Secondary | ICD-10-CM | POA: Diagnosis not present

## 2015-06-21 NOTE — Progress Notes (Signed)
.   Radiation Oncology         [phone] ________________________________  Name: Heather [name] MRN: [id number]  Date: 05/19/2015  DOB: [birth year]  End of Treatment Note  Diagnosis:    ICD-9-CM ICD-10-CM    1. Metastatic cancer to spine [name]) 198.5 C79.51      70 yo woman with an isolated solitary spine metastasis from metastatic breast      Indication for treatment:  Palliation       Radiation treatment dates:   04/13/2015-05/19/2015  Site/dose:   55 Gy in 25 fractions of 2.2 Gy to the thoracic spine   Beams/energy:   The patient was treated using helical tomotherapy with 6 MV X-rays and daily image guidance.  Narrative: The patient tolerated radiation treatment relatively well.   Her lower extremity strength showed some signs of improvement. She did not have any significant acute effects.  Plan: The patient has completed radiation treatment. The patient will return to radiation oncology clinic for routine followup in one month. I advised her to call or return sooner if she has any questions or concerns related to her recovery or treatment. ________________________________  Sheral Apley. [name], M.D.  This document serves as a record of services personally performed by Tyler Pita, MD. It was created on his behalf by Arlyce Harman, a trained medical scribe. The creation of this record is based on the scribe's personal observations and the provider's statements to them. This document has been checked and approved by the attending provider.

## 2015-06-22 DIAGNOSIS — C7951 Secondary malignant neoplasm of bone: Secondary | ICD-10-CM | POA: Diagnosis not present

## 2015-06-22 DIAGNOSIS — N319 Neuromuscular dysfunction of bladder, unspecified: Secondary | ICD-10-CM | POA: Diagnosis not present

## 2015-06-22 DIAGNOSIS — Z87440 Personal history of urinary (tract) infections: Secondary | ICD-10-CM | POA: Diagnosis not present

## 2015-06-22 DIAGNOSIS — G8222 Paraplegia, incomplete: Secondary | ICD-10-CM | POA: Diagnosis not present

## 2015-06-22 DIAGNOSIS — G9529 Other cord compression: Secondary | ICD-10-CM | POA: Diagnosis not present

## 2015-06-22 DIAGNOSIS — M8458XS Pathological fracture in neoplastic disease, other specified site, sequela: Secondary | ICD-10-CM | POA: Diagnosis not present

## 2015-06-23 DIAGNOSIS — G9529 Other cord compression: Secondary | ICD-10-CM | POA: Diagnosis not present

## 2015-06-23 DIAGNOSIS — N319 Neuromuscular dysfunction of bladder, unspecified: Secondary | ICD-10-CM | POA: Diagnosis not present

## 2015-06-23 DIAGNOSIS — M8458XS Pathological fracture in neoplastic disease, other specified site, sequela: Secondary | ICD-10-CM | POA: Diagnosis not present

## 2015-06-23 DIAGNOSIS — Z87440 Personal history of urinary (tract) infections: Secondary | ICD-10-CM | POA: Diagnosis not present

## 2015-06-23 DIAGNOSIS — G8222 Paraplegia, incomplete: Secondary | ICD-10-CM | POA: Diagnosis not present

## 2015-06-23 DIAGNOSIS — C7951 Secondary malignant neoplasm of bone: Secondary | ICD-10-CM | POA: Diagnosis not present

## 2015-06-24 DIAGNOSIS — G9529 Other cord compression: Secondary | ICD-10-CM | POA: Diagnosis not present

## 2015-06-24 DIAGNOSIS — M8458XS Pathological fracture in neoplastic disease, other specified site, sequela: Secondary | ICD-10-CM | POA: Diagnosis not present

## 2015-06-24 DIAGNOSIS — C7951 Secondary malignant neoplasm of bone: Secondary | ICD-10-CM | POA: Diagnosis not present

## 2015-06-24 DIAGNOSIS — N319 Neuromuscular dysfunction of bladder, unspecified: Secondary | ICD-10-CM | POA: Diagnosis not present

## 2015-06-24 DIAGNOSIS — Z87440 Personal history of urinary (tract) infections: Secondary | ICD-10-CM | POA: Diagnosis not present

## 2015-06-24 DIAGNOSIS — G8222 Paraplegia, incomplete: Secondary | ICD-10-CM | POA: Diagnosis not present

## 2015-06-25 ENCOUNTER — Ambulatory Visit
Admission: RE | Admit: 2015-06-25 | Discharge: 2015-06-25 | Disposition: A | Discharge status: Home / Self Care | Payer: Medicare Other | Source: Ambulatory Visit | Attending: Radiation Oncology | Admitting: Radiation Oncology

## 2015-06-25 VITALS — BP 153/72 | HR 83 | Resp 16 | Wt 147.0 lb

## 2015-06-25 DIAGNOSIS — C7951 Secondary malignant neoplasm of bone: Secondary | ICD-10-CM

## 2015-06-25 DIAGNOSIS — C50411 Malignant neoplasm of upper-outer quadrant of right female breast: Secondary | ICD-10-CM

## 2015-06-25 DIAGNOSIS — C50911 Malignant neoplasm of unspecified site of right female breast: Secondary | ICD-10-CM

## 2015-06-25 NOTE — Progress Notes (Signed)
Radiation Oncology         [phone] ________________________________  Name: Heather [name] MRN: [id number]  Date: 06/25/2015  DOB: [birth year]  Follow-Up Visit Note  CC: Vidal Schwalbe, MD  Meredith Staggers, MD  Diagnosis: 70 y.o. woman with an isolated solitary spine metastasis from metastatic breast     ICD-9-CM ICD-10-CM   1. Breast cancer metastasized to bone, right (HCC) 174.9 C50.911 MR Thoracic Spine W Contrast   198.5 C79.51   2. Metastatic cancer to spine (HCC) 198.5 C79.51 MR Thoracic Spine W Contrast  3. Primary cancer of upper outer quadrant of right female breast (HCC) 174.4 C50.411     Interval Since Last Radiation: 5  weeks.  04/13/2015 - 05/19/2015: 55 Gy in 25 fractions of 2.2 Gy to the thoracic spine.  10/25/2012 - 12/11/2012: Right chest wall/regional lymph nodes 50.4 Gy in 28 sessions, right chest wall boost 10 Gy by Dr. Valere Dross.  Narrative:  The patient returns today for routine follow-up. BP slightly elevated. Reports bilateral pain in the hips and legs are intermittent and associated with increased activity. Reports left breast pain has returned and is constant. Reports taking tramadol once a day to manage pain. Reports edema in her legs is less. Reports gabapentin has been increased from 400 to 600 mg daily. Reports she used a walker for the first time this week and is scheduled to pick up her leg braces on 07/02/2015. She reports a raised area noted top of her left foot with scaly skin. Question cellulitis. The area appeared approximately a month and a half ago as a bruise. Numbness from the waist down continues. Leg strength has greatly improved and is even lifting ten pound weights on each leg. Denies swallowing difficulties.  ALLERGIES:  is allergic to codeine; keflex; naprosyn; oxycodone; and tussin.  Meds: Current Outpatient Prescriptions  Medication Sig Dispense Refill  . anastrozole (ARIMIDEX) 1 MG tablet Take 1 tablet by mouth daily.    Marland Kitchen apixaban  (ELIQUIS) 2.5 MG TABS tablet Take 1 tablet (2.5 mg total) by mouth 2 (two) times daily. 180 tablet 0  . b complex vitamins tablet Take 1 tablet by mouth daily.    . Calcium Carbonate Antacid (TUMS PO) Take 2 tablets by mouth 2 (two) times daily as needed (acid reflux).    . Cholecalciferol (VITAMIN D) 2000 UNITS tablet Take 2,000 Units by mouth daily.    . Coenzyme Q10 (COQ10) 100 MG CAPS Take 100 mg by mouth at bedtime.    . gabapentin (NEURONTIN) 600 MG tablet Take 1 tablet (600 mg total) by mouth 3 (three) times daily. 90 tablet 3  . lidocaine-prilocaine (EMLA) cream Apply to affected area once (Patient taking differently: Apply 1 application topically every 21 ( twenty-one) days. Apply to port prior to infusions) 30 g 3  . methocarbamol (ROBAXIN) 500 MG tablet Take 1 tablet (500 mg total) by mouth daily as needed for muscle spasms. 75 tablet 0  . OVER THE COUNTER MEDICATION Place 1 drop into both eyes 2 (two) times daily as needed (dry eyes). Over the counter lubricating eye drops    . polyethylene glycol (MIRALAX / GLYCOLAX) packet Take 17 g by mouth 2 (two) times daily. (Patient taking differently: Take 8.5 g by mouth daily as needed (constipation). ) 100 each 0  . pravastatin (PRAVACHOL) 10 MG tablet Take 10 mg by mouth at bedtime.     . Probiotic Product (PROBIOTIC DAILY PO) Take 1 capsule by mouth 2 (two) times  daily.     . spironolactone (ALDACTONE) 25 MG tablet Take 0.5 tablets (12.5 mg total) by mouth daily. 45 tablet 12  . traMADol (ULTRAM) 50 MG tablet Take 1 tablet (50 mg total) by mouth every 6 (six) hours as needed for moderate pain. 60 tablet 0  . Calcium Carbonate-Vitamin D (CALCIUM-D PO) Take 1 tablet by mouth daily. Reported on 06/25/2015    . prochlorperazine (COMPAZINE) 10 MG tablet Take 1 tablet (10 mg total) by mouth every 6 (six) hours as needed for nausea or vomiting. (Patient not taking: Reported on 06/25/2015) 30 tablet 0  . ranitidine (ZANTAC) 300 MG tablet Take 300 mg by  mouth at bedtime. Reported on 06/25/2015    . sucralfate (CARAFATE) 1 G tablet Take 1 tablet (1 g total) by mouth 4 (four) times daily. (Patient not taking: Reported on 06/25/2015) 120 tablet 2  . Wound Cleansers (RADIAPLEX EX) Apply topically. Reported on 06/25/2015     No current facility-administered medications for this encounter.    Physical Findings:  weight is 147 lb (66.679 kg). Her blood pressure is 153/72 and her pulse is 83. Her respiration is 16 and oxygen saturation is 100%.   Pain scale 2/10 General this is a well-appearing Caucasian female in no acute distress. She's alert and oriented 4 and appropriate throughout the examination.She presents to the clinic in a wheelchair. Her  incision is well-healed on the posterior thorax along her thoracic spinal column and there is no evidence of desquamation present. The patient has ecchymotic changes on the left dorsal foot with no evidence of cellulitis. Motor strength is 3+ in the lower extremities and cutaneous sensation is noted with light touch over her left dorsal foot. Cardiopulmonary assessment reveals normal effort without evidence of acute distress.  Lab Findings: Lab Results  Component Value Date   WBC 4.8 06/11/2015   WBC 6.4 04/09/2015   HGB 10.6* 06/11/2015   HGB 10.1* 04/09/2015   HCT 34.6* 06/11/2015   HCT 33.0* 04/09/2015   PLT 252 06/11/2015   PLT 243 04/09/2015    Lab Results  Component Value Date   NA 139 06/11/2015   NA 135 03/28/2015   K 4.1 06/11/2015   K 3.8 03/28/2015   CHLORIDE 104 06/11/2015   CO2 25 06/11/2015   CO2 28 03/28/2015   GLUCOSE 100 06/11/2015   GLUCOSE 159* 03/28/2015   GLUCOSE 90 10/25/2012   BUN 12.0 06/11/2015   BUN 24* 03/28/2015   CREATININE 0.7 06/11/2015   CREATININE 0.78 03/28/2015   BILITOT 0.45 06/11/2015   BILITOT 0.9 03/27/2015   ALKPHOS 73 06/11/2015   ALKPHOS 124 03/27/2015   AST 21 06/11/2015   AST 42* 03/27/2015   ALT 13 06/11/2015   ALT 136* 03/27/2015   PROT  7.0 06/11/2015   PROT 6.0* 03/27/2015   ALBUMIN 3.5 06/11/2015   ALBUMIN 3.4* 03/27/2015   CALCIUM 9.1 06/11/2015   CALCIUM 7.8* 03/28/2015   ANIONGAP 10 06/11/2015   ANIONGAP 8 03/28/2015    Radiographic Findings: No results found.  Impression:  70 year old female with Metastatic breast cancer to the T-spine that is post decompression and subsequent radiotherapy completed in January 2016  Plan: The patient appears to be doing really well since her last visit, and has regained lower extremity strength with the assistance of physical therapy. She is also regained some of her sensation. We are very pleased with this. She will continue Herceptin, aromatase inhibitor, and Zometa with [name]. Dr. Marlynn Perking can Morris planning  to reimage her brain in the near future and we have recommended proceeding with an MRI of the thoracic spine in 3 months time. We will follow-up with these results and with her at that time. She is encouraged to call if she has any questions or concerns prior to that visit.  The above documentation reflects my direct findings during this shared patient visit. Please see the separate note by Dr. [name] on this date for the remainder of the patient's plan of care.  Carola Rhine, PAC   This document serves as a record of services personally performed by Shona Simpson, PAC and Tyler Pita, MD. It was created on their behalf by Darcus Austin, a trained medical scribe. The creation of this record is based on the scribe's personal observations and the provider's statements to them. This document has been checked and approved by the attending provider.

## 2015-06-25 NOTE — Progress Notes (Signed)
BP slightly elevated. Reports pain in hips and legs are intermittent and associated with increased activity. Reports left breast pain has returned and constant. Reports taking tramadol once a day to manage pain. Reports edema in legs is less. Reports gabapentin has been increased from 400 to 600 mg daily. Reports she used a walker for the first time this week and is scheduled to pick up leg braces on 07/02/2015. Raised area noted top of foot with scaly skin. Question cellulitis. Area appeared approximately a month and a half ago as a bruise. Numbness from waist down continues. Leg strength greatly improved even lifting ten pound weights on each leg.    BP 153/72 mmHg  Pulse 83  Resp 16  Wt 147 lb (66.679 kg)  SpO2 100% Wt Readings from Last 3 Encounters:  06/25/15 147 lb (66.679 kg)  06/11/15 146 lb 9.6 oz (66.497 kg)  05/08/15 131 lb (59.421 kg)

## 2015-06-26 ENCOUNTER — Ambulatory Visit: Payer: Medicare Other | Attending: Family Medicine | Admitting: Physical Therapy

## 2015-06-26 DIAGNOSIS — R52 Pain, unspecified: Secondary | ICD-10-CM | POA: Insufficient documentation

## 2015-06-26 DIAGNOSIS — R269 Unspecified abnormalities of gait and mobility: Secondary | ICD-10-CM | POA: Diagnosis not present

## 2015-06-26 DIAGNOSIS — X58XXXA Exposure to other specified factors, initial encounter: Secondary | ICD-10-CM | POA: Diagnosis not present

## 2015-06-26 DIAGNOSIS — S24151A Other incomplete lesion at T1 level of thoracic spinal cord, initial encounter: Secondary | ICD-10-CM | POA: Diagnosis not present

## 2015-06-26 DIAGNOSIS — R29898 Other symptoms and signs involving the musculoskeletal system: Secondary | ICD-10-CM | POA: Diagnosis not present

## 2015-06-26 NOTE — Therapy (Signed)
Heather [name] [address] [place], [place], [address] Phone: [phone]   Fax:  [phone]  Physical Therapy Evaluation  Patient Details  Name: Heather [name] MRN: [id number] Date of Birth: 1945/09/29 Referring Provider: Alger Simons, MD  Encounter Date: 06/26/2015      PT End of Session - 06/26/15 0916    Visit Number 1   Number of Visits 24   Date for PT Re-Evaluation 08/21/15   PT Start Time 0830   PT Stop Time 0910   PT Time Calculation (min) 40 min   Equipment Utilized During Treatment Gait belt   Activity Tolerance Patient tolerated treatment well   Behavior During Therapy [facility] for tasks assessed/performed      Past Medical History  Diagnosis Date  . Chronic kidney disease   . Hx: UTI (urinary tract infection)   . Right shoulder pain   . Right knee pain   . Hypertension     recently started Aldactone   . Hyperlipidemia     but not on meds;diet and exercise controlled  . Dizziness     has been going on for 22month;medical MD aware  . H/O hiatal hernia   . GERD (gastroesophageal reflux disease)     Tums prn  . Hemorrhoids   . Urinary frequency     d/t taking Aldactone  . History of UTI   . Anemia     hx of  . Cataract     immature-not sure of which eye  . Anxiety     r/t updated surgery  . Insomnia     takes Melatoniin daily  . Breast cancer ([facility]     right  . Skin cancer   . Hx of radiation therapy 10/25/12- 12/11/12    right chest wall/regional lymph nodes 5040 cGy, 28 sessions, right chest wall boost 1000 cGy 1 session  . H. pylori infection   . Metastasis to spinal column (HCC)     T5  . Complication of anesthesia     pt states she is sensitive to meds  . PONV (postoperative nausea and vomiting)     pt experienced hair loss, confusion and combative- 02/15/15    Past Surgical History  Procedure Laterality Date  . Cyst removed from left breast  1970  . Left wrist surgery   2004   with plate  . Colonoscopy    . Esophagogastroduodenoscopy    . Mastectomy, radical    . Mastectomy modified radical  05/10/2012    Procedure: MASTECTOMY MODIFIED RADICAL;  Surgeon: PMerrie Roof MD;  [facility]  Service: General;  Laterality: Right;  RIGHT MODIFIED RADICAL MASTECTOMY  . Portacath placement  05/10/2012    Procedure: INSERTION PORT-A-CATH;  Surgeon: PMerrie Roof MD;  [facility]  Service: General;  Laterality: Left;  . Decompressive lumbar laminectomy level 4 N/A 02/15/2015    Procedure: DECOMPRESSION T5 AND T3-T7 STABALIZATION;  Surgeon: NConsuella Lose MD;  [facility];  Service: Neurosurgery;  Laterality: N/A;  . Laminectomy N/A 03/27/2015    Procedure: Thoracic Four-Thoracic Six Laminectomy for tumor;  Surgeon: NConsuella Lose MD;  [facility];  Service: Neurosurgery;  Laterality: N/A;  T4-T6 Laminectomy    There were no vitals filed for this visit.  Visit Diagnosis:  Weakness of both lower extremities - Plan: PT plan of care cert/re-cert  Abnormality of gait - Plan: PT plan of care cert/re-cert  Incomplete spinal cord lesion  at T1-T6 level without bone injury, initial encounter [facility]) - Plan: PT plan of care cert/re-cert      Subjective Assessment - 06/26/15 0834    Subjective Pt is a 70 y/o who presents to OPPT with dx of breast cancer in Oct 2013.  Pt developed lower extremity weakness in Oct 2016; underwent emergency surgery for T5 tumor resection due to cord compression.  Pt went to CIR and d/c in early Nov.  Repeat imaging showed continued cord compression so pt underwent additional surgery decompression.  Pt d/c'ed from hospital with [place]; and recently d/c from [place].  Pt presents to OPPT with continued deficts and hope to regain some functional mobility.   Limitations House hold activities;Standing;Walking   How long can you walk comfortably? first time 2/13: 30-40 ft with RW   Currently in Pain? Yes   Pain Score 2   up  to 4/10   Pain Location Back   Pain Orientation Mid   Pain Descriptors / Indicators Aching   Pain Radiating Towards around chest   Pain Onset More than a month ago   Pain Frequency Intermittent   Aggravating Factors  activity   Pain Relieving Factors rest, heat            [facility] PT Assessment - 06/26/15 0839    Assessment   Medical Diagnosis T5 Incomplete SCI   Referring Provider Alger Simons, MD   Onset Date/Surgical Date 02/15/16   Next MD Visit Mar 2017   Prior Therapy [facility], [facility]   Precautions   Precautions Fall;Back   Restrictions   Weight Bearing Restrictions No   Balance Screen   Has the patient fallen in the past 6 months Yes   How many times? 3-down to knees   Has the patient had a decrease in activity level because of a fear of falling?  No   Is the patient reluctant to leave their home because of a fear of falling?  No   Home Environment   Living Environment Private residence   Living Arrangements Spouse/significant other   Available Help at Discharge Family;Available 24 hours/day   Type of [place] to live on main level with bedroom/bathroom;Two level   Carefree - 2 wheels;Wheelchair - manual;Shower seat;Bedside commode   Additional Comments has a steady   Prior Function   Level of Independence Independent   Vocation Retired   Leisure exercise at [employer] 4-5 x/wk, crochet, reading, gardening   Cognition   Overall Cognitive Status Within Functional Limits for tasks assessed   Sensation   Additional Comments absent from waist down   Strength   Overall Strength Comments all strength assessments were done in sitting   Strength Assessment Site Hip;Ankle;Knee   Right Hip Flexion 3-/5   Right Hip ABduction 3-/5   Right Hip ADduction 3/5   Left Hip Flexion 3/5   Left Hip ABduction 3/5   Left Hip ADduction 3+/5   Right Knee Flexion 3/5   Right Knee Extension 3/5   Left Knee Flexion 3+/5   Left  Knee Extension 4/5   Right Ankle Dorsiflexion 3/5   Right Ankle Plantar Flexion 3-/5   Left Ankle Dorsiflexion 3/5   Left Ankle Plantar Flexion 3/5   Transfers   Transfers Sit to Stand;Stand to Sit   Sit to Stand 4: Min assist   Sit to Stand Details (indicate cue type and reason) posterior lean   Stand to  Sit 3: Mod assist   Stand to Sit Details to control descent   Ambulation/Gait   Ambulation/Gait Yes   Ambulation/Gait Assistance 3: Mod assist   Ambulation/Gait Assistance Details posterior lean; relying on knee recurvatum to prevent buckling   Ambulation Distance (Feet) 20 Feet   Assistive device Rolling walker   Gait Pattern Step-to pattern;Decreased step length - right;Decreased step length - left;Decreased dorsiflexion - right;Decreased dorsiflexion - left;Right steppage;Left steppage;Right genu recurvatum;Left genu recurvatum;Poor foot clearance - left;Poor foot clearance - right;Narrow base of support   Gait velocity 0.07 ft/sec  3 ft = 42.78 sec   Balance   Balance Assessed --  unable to stand unsupported                           PT Education - 06/26/15 0916    Education provided Yes   Education Details POC, goals of care   Person(s) Educated Patient;Spouse   Methods Explanation   Comprehension Verbalized understanding          PT Short Term Goals - 06/26/15 0920    PT SHORT TERM GOAL #1   Title independent with initial HEP (07/24/15)   Status New   PT SHORT TERM GOAL #2   Title ambulate > 2' with RW with min A for improved mobility and household ambulation    Status New   PT SHORT TERM GOAL #3   Title perform sit to/from stand transfers with supervision for improved independence    Status New   PT SHORT TERM GOAL #4   Title improve gait velocity to > 0.5 ft/sec for improved function and mobility   Status New           PT Long Term Goals - 06/26/15 1941    PT LONG TERM GOAL #1   Title ambulate > 100' with supervision with RW for  improved mobility (08/21/15)   Status New   PT LONG TERM GOAL #2   Title improve gait velocity to > 1.0 ft/sec for improved function and mobility    Status New   PT LONG TERM GOAL #3   Title stand unsupported x 5 min with supervision for improved lower extremity strength and function    Status New   PT LONG TERM GOAL #4   Title perform stand pivot transfers with supervision for improved function and independence   Status New               Plan - 06/26/15 0917    Clinical Impression Statement Pt is a 70 y/o female who presents to OPPT for moderate complexity evaluation of incomplete SCI at T5 due to tumor and cord compression. Pt demonstrates significant lower extremity weakness and decreased functional mobility and standing tolerance for ADLs.  Pt will benefit from OPPT to address deficits listed.    Pt will benefit from skilled therapeutic intervention in order to improve on the following deficits Abnormal gait;Difficulty walking;Pain;Impaired sensation;Decreased strength;Decreased mobility;Decreased balance  will monitor pain; but will not directly address   Rehab Potential Good   PT Frequency 3x / week   PT Duration 8 weeks   PT Treatment/Interventions ADLs/Self Care Home Management;Electrical Stimulation;Therapeutic exercise;Therapeutic activities;Functional mobility training;Stair training;Gait training;DME Instruction;Balance training;Neuromuscular re-education;Patient/family education;Orthotic Fit/Training   PT Next Visit Plan HEP for lower extremity strengthening; sitting and supine; gait and standing tolerance   Consulted and Agree with Plan of Care Patient;Family member/caregiver   Family Member Consulted husband  G-Codes - 06/26/15 1937    Functional Assessment Tool Used amb 20' with mod A   Functional Limitation Mobility: Walking and moving around   Mobility: Walking and Moving Around Current Status [phone]) At least 60 percent but less than 80 percent  impaired, limited or restricted   Mobility: Walking and Moving Around Goal Status [phone]) At least 20 percent but less than 40 percent impaired, limited or restricted       Problem List Patient Active Problem List   Diagnosis Date Noted  . Anemia of chronic disease 05/21/2015  . Metastatic cancer to spine ([place]) 03/27/2015  . Thrombocytopenia ([place]) 03/12/2015  . Prerenal azotemia 03/12/2015  . Pain   . Recurrent UTI--due to Klebsiella   . Paraplegia at T4 level ([place]) 02/20/2015  . Neurogenic bowel 02/20/2015  . Neurogenic bladder 02/20/2015  . Postoperative anemia due to acute blood loss 02/17/2015  . Spinal cord compression due to malignant neoplasm metastatic to spine ([place]) 02/15/2015  . Epidural mass 02/15/2015  . Thoracic spine tumor   . Pathologic fracture of vertebra 02/14/2015  . Pathologic fracture of thoracic vertebrae 02/14/2015  . Breast cancer metastasized to bone ([place]) 02/14/2015  . Hyperlipidemia 02/14/2015  . H. pylori infection   . HTN (hypertension) 04/17/2012  . Primary cancer of upper outer quadrant of right female breast ([place]) 03/08/2012   Laureen Abrahams, PT, DPT 06/26/2015 9:26 AM  [facility] [address] [place], [place], [address] Phone: [phone]   Fax:  [phone]  Name: Heather [name] MRN: [id number] Date of Birth: 03/13/1946

## 2015-06-26 NOTE — Patient Instructions (Signed)
Contact our office if you have any questions following today's appointment: [phone].  

## 2015-06-29 ENCOUNTER — Ambulatory Visit: Payer: Medicare Other | Admitting: Physical Therapy

## 2015-06-29 ENCOUNTER — Encounter: Payer: Self-pay | Admitting: *Deleted

## 2015-06-29 DIAGNOSIS — R269 Unspecified abnormalities of gait and mobility: Secondary | ICD-10-CM

## 2015-06-29 DIAGNOSIS — R29898 Other symptoms and signs involving the musculoskeletal system: Secondary | ICD-10-CM | POA: Diagnosis not present

## 2015-06-29 DIAGNOSIS — R52 Pain, unspecified: Secondary | ICD-10-CM | POA: Diagnosis not present

## 2015-06-29 DIAGNOSIS — S24151A Other incomplete lesion at T1 level of thoracic spinal cord, initial encounter: Secondary | ICD-10-CM | POA: Diagnosis not present

## 2015-06-29 NOTE — Therapy (Signed)
[facility] [address] [place], [place], [address] Phone: [phone]   Fax:  [phone]  Physical Therapy Treatment  Patient Details  Name: Heather [name] MRN: [id number] Date of Birth: July 22, 1945 Referring Provider: Alger Simons, MD  Encounter Date: 06/29/2015      PT End of Session - 06/29/15 1040    Visit Number 2   Number of Visits 24   Date for PT Re-Evaluation 08/21/15   PT Start Time 0930   PT Stop Time 1015   PT Time Calculation (min) 45 min   Equipment Utilized During Treatment Gait belt   Activity Tolerance Patient tolerated treatment well   Behavior During Therapy [facility] for tasks assessed/performed      Past Medical History  Diagnosis Date  . Chronic kidney disease   . Hx: UTI (urinary tract infection)   . Right shoulder pain   . Right knee pain   . Hypertension     recently started Aldactone   . Hyperlipidemia     but not on meds;diet and exercise controlled  . Dizziness     has been going on for 81month;medical MD aware  . H/O hiatal hernia   . GERD (gastroesophageal reflux disease)     Tums prn  . Hemorrhoids   . Urinary frequency     d/t taking Aldactone  . History of UTI   . Anemia     hx of  . Cataract     immature-not sure of which eye  . Anxiety     r/t updated surgery  . Insomnia     takes Melatoniin daily  . Breast cancer ([facility]     right  . Skin cancer   . Hx of radiation therapy 10/25/12- 12/11/12    right chest wall/regional lymph nodes 5040 cGy, 28 sessions, right chest wall boost 1000 cGy 1 session  . H. pylori infection   . Metastasis to spinal column (HCC)     T5  . Complication of anesthesia     pt states she is sensitive to meds  . PONV (postoperative nausea and vomiting)     pt experienced hair loss, confusion and combative- 02/15/15    Past Surgical History  Procedure Laterality Date  . Cyst removed from left breast  1970  . Left wrist surgery   2004   with plate  . Colonoscopy    . Esophagogastroduodenoscopy    . Mastectomy, radical    . Mastectomy modified radical  05/10/2012    Procedure: MASTECTOMY MODIFIED RADICAL;  Surgeon: PMerrie Roof MD;  [facility]  Service: General;  Laterality: Right;  RIGHT MODIFIED RADICAL MASTECTOMY  . Portacath placement  05/10/2012    Procedure: INSERTION PORT-A-CATH;  Surgeon: PMerrie Roof MD;  [place] [facility]  Service: General;  Laterality: Left;  . Decompressive lumbar laminectomy level 4 N/A 02/15/2015    Procedure: DECOMPRESSION T5 AND T3-T7 STABALIZATION;  Surgeon: NConsuella Lose MD;  [facility];  Service: Neurosurgery;  Laterality: N/A;  . Laminectomy N/A 03/27/2015    Procedure: Thoracic Four-Thoracic Six Laminectomy for tumor;  Surgeon: NConsuella Lose MD;  [facility];  Service: Neurosurgery;  Laterality: N/A;  T4-T6 Laminectomy    There were no vitals filed for this visit.  Visit Diagnosis:  Abnormality of gait  Weakness of both lower extremities      Subjective Assessment - 06/29/15 0937    Subjective Plans to get her braces  March 1st from [place].  Has supine and seated HEP already given to her by CIR and HHPT.   Limitations House hold activities;Standing;Walking   How long can you walk comfortably? first time 2/13: 30-40 ft with RW   Currently in Pain? Yes   Pain Score 2    Pain Location Chest   Pain Orientation Left   Pain Descriptors / Indicators Aching   Pain Type Chronic pain   Pain Onset More than a month ago   Pain Frequency Constant                         [facility] Adult PT Treatment/Exercise - 06/29/15 0001    Ambulation/Gait   Ambulation/Gait Yes   Ambulation/Gait Assistance 3: Mod assist  with w/c following   Ambulation Distance (Feet) 20 Feet  +35   Assistive device Rolling walker   Gait Pattern Step-to pattern;Decreased hip/knee flexion - right;Right genu recurvatum;Left genu recurvatum   Ambulation Surface  Level   Exercises   Exercises Other Exercises  Core strengthening in Quadruped- Posterior pelvic tils 5x2   Other Exercises  Long sit core strengthening with 1kg wt ball chest press and toss  progressing toss with changes in direction   Knee/Hip Exercises: Prone   Hamstring Curl 10 reps  AROM flexion and AAROM extension.                PT Education - 06/29/15 1039    Education provided Yes   Education Details Core strengthening technique in quadruped   Methods Explanation;Demonstration;Tactile cues;Verbal cues   Comprehension Verbalized understanding;Returned demonstration;Need further instruction          PT Short Term Goals - 06/29/15 1041    PT SHORT TERM GOAL #1   Title independent with initial HEP (07/24/15)   Status New   PT SHORT TERM GOAL #2   Title ambulate > 20' with RW with min A for improved mobility and household ambulation    Status New   PT SHORT TERM GOAL #3   Title perform sit to/from stand transfers with supervision for improved independence    Status New   PT SHORT TERM GOAL #4   Title improve gait velocity to > 0.5 ft/sec for improved function and mobility   Status New           PT Long Term Goals - 06/29/15 1041    PT LONG TERM GOAL #1   Title ambulate > 100' with supervision with RW for improved mobility (08/21/15)   Status New   PT LONG TERM GOAL #2   Title improve gait velocity to > 1.0 ft/sec for improved function and mobility    Status New   PT LONG TERM GOAL #3   Title stand unsupported x 5 min with supervision for improved lower extremity strength and function    Status New   PT LONG TERM GOAL #4   Title perform stand pivot transfers with supervision for improved function and independence   Status New               Plan - 06/29/15 1041    Clinical Impression Statement Skilled session focused on gait tolerance and core strengthening. Pt demonstrates having poorer gait quality with fatigue increasing fall risk.  Pt was  able to get into quadruped with Min A well and seemed to tolerate. Pt is making gradual progress towards goals.   Pt will benefit from skilled therapeutic intervention in order to  improve on the following deficits Abnormal gait;Difficulty walking;Pain;Impaired sensation;Decreased strength;Decreased mobility;Decreased balance  will monitor pain; but will not directly address   Rehab Potential Good   PT Frequency 3x / week   PT Duration 8 weeks   PT Treatment/Interventions ADLs/Self Care Home Management;Electrical Stimulation;Therapeutic exercise;Therapeutic activities;Functional mobility training;Stair training;Gait training;DME Instruction;Balance training;Neuromuscular re-education;Patient/family education;Orthotic Fit/Training   PT Next Visit Plan Review HEP  handouts for lower extremity strengthening that pt brings from home;  gait and standing tolerance; LE strengthening possibly using Leg press if appropriate per pt request.   Consulted and Agree with Plan of Care Patient;Family member/caregiver   Family Member Consulted husband        Problem List Patient Active Problem List   Diagnosis Date Noted  . Anemia of chronic disease 05/21/2015  . Metastatic cancer to spine ([place]) 03/27/2015  . Thrombocytopenia ([place]) 03/12/2015  . Prerenal azotemia 03/12/2015  . Pain   . Recurrent UTI--due to Klebsiella   . Paraplegia at T4 level ([place]) 02/20/2015  . Neurogenic bowel 02/20/2015  . Neurogenic bladder 02/20/2015  . Postoperative anemia due to acute blood loss 02/17/2015  . Spinal cord compression due to malignant neoplasm metastatic to spine ([place]) 02/15/2015  . Epidural mass 02/15/2015  . Thoracic spine tumor   . Pathologic fracture of vertebra 02/14/2015  . Pathologic fracture of thoracic vertebrae 02/14/2015  . Breast cancer metastasized to bone ([place]) 02/14/2015  . Hyperlipidemia 02/14/2015  . H. pylori infection   . HTN (hypertension) 04/17/2012  . Primary cancer of upper outer  quadrant of right female breast ([place]) 03/08/2012   Bjorn Loser, PTA  06/29/2015, 10:47 AM [facility] [address] [place], [place], [address] Phone: [phone]   Fax:  [phone]  Name: Heather [name] MRN: [id number] Date of Birth: 02/20/1946

## 2015-07-01 ENCOUNTER — Telehealth: Payer: Self-pay | Admitting: *Deleted

## 2015-07-01 ENCOUNTER — Telehealth: Payer: Self-pay | Admitting: Hematology and Oncology

## 2015-07-01 ENCOUNTER — Telehealth: Payer: Self-pay | Admitting: Physical Medicine & Rehabilitation

## 2015-07-01 ENCOUNTER — Ambulatory Visit: Payer: Medicare Other | Admitting: Physical Therapy

## 2015-07-01 DIAGNOSIS — R29898 Other symptoms and signs involving the musculoskeletal system: Secondary | ICD-10-CM | POA: Diagnosis not present

## 2015-07-01 DIAGNOSIS — R52 Pain, unspecified: Secondary | ICD-10-CM

## 2015-07-01 DIAGNOSIS — R269 Unspecified abnormalities of gait and mobility: Secondary | ICD-10-CM | POA: Diagnosis not present

## 2015-07-01 DIAGNOSIS — G8220 Paraplegia, unspecified: Secondary | ICD-10-CM

## 2015-07-01 DIAGNOSIS — S24151A Other incomplete lesion at T1 level of thoracic spinal cord, initial encounter: Secondary | ICD-10-CM

## 2015-07-01 NOTE — Telephone Encounter (Signed)
The Rx was sent to [facility] and they did receive it. The patient was seen and now they need the medical letter of necessity signed and returned ASAP, otherwise the patient will have to be rescheduled until after they receive it

## 2015-07-01 NOTE — Telephone Encounter (Signed)
Per response from [facility] in [place] for echo. Spoke with patient re echo 07/13/15 @ 11 am at [facility].

## 2015-07-01 NOTE — Patient Instructions (Signed)
Therapeutic - Bridging    Lift buttocks, keeping back straight and arms on floor. Hold _3-5___ seconds. Repeat __10__ times.  USE BALL BETWEEN KNEES AND SQUEEZE WITH KNEES.  Copyright  VHI. All rights reserved.   Hip Rotation in Supine    Lie on back, legs over ball. Rotate legs from side to side. Do _1__ sets of _20__ repetitions (10 times to each side).  Have husband hold feet for safety but you do all the work!  Copyright  VHI. All rights reserved.

## 2015-07-01 NOTE — Telephone Encounter (Signed)
Order written and sent

## 2015-07-01 NOTE — Telephone Encounter (Signed)
The rx was faxed to this office Friday.

## 2015-07-01 NOTE — Telephone Encounter (Signed)
Heather [name] from [facility] called stating that on Feb 15th they had faxed over a letter of medical necessity to be signed and returned.  She says they have not received the signed document. Patients upcoming appointment will be cancelled if we cannot get that back to them soon.  I called her back and informed that we were unable to locate the document and asked her to fax over a new one.

## 2015-07-01 NOTE — Therapy (Signed)
[facility] [address] [place], [place], [address] Phone: [phone]   Fax:  [phone]  Physical Therapy Treatment  Patient Details  Name: Heather [name] MRN: [id number] Date of Birth: Dec 04, 1945 Referring Provider: Alger Simons, MD  Encounter Date: 07/01/2015      PT End of Session - 07/01/15 7619    Visit Number 3   Number of Visits 24   Date for PT Re-Evaluation 08/21/15   PT Start Time 0830   PT Stop Time 0914   PT Time Calculation (min) 44 min   Equipment Utilized During Treatment Gait belt   Activity Tolerance Patient tolerated treatment well   Behavior During Therapy [facility] for tasks assessed/performed      Past Medical History  Diagnosis Date  . Chronic kidney disease   . Hx: UTI (urinary tract infection)   . Right shoulder pain   . Right knee pain   . Hypertension     recently started Aldactone   . Hyperlipidemia     but not on meds;diet and exercise controlled  . Dizziness     has been going on for 37month;medical MD aware  . H/O hiatal hernia   . GERD (gastroesophageal reflux disease)     Tums prn  . Hemorrhoids   . Urinary frequency     d/t taking Aldactone  . History of UTI   . Anemia     hx of  . Cataract     immature-not sure of which eye  . Anxiety     r/t updated surgery  . Insomnia     takes Melatoniin daily  . Breast cancer ([facility]     right  . Skin cancer   . Hx of radiation therapy 10/25/12- 12/11/12    right chest wall/regional lymph nodes 5040 cGy, 28 sessions, right chest wall boost 1000 cGy 1 session  . H. pylori infection   . Metastasis to spinal column (HCC)     T5  . Complication of anesthesia     pt states she is sensitive to meds  . PONV (postoperative nausea and vomiting)     pt experienced hair loss, confusion and combative- 02/15/15    Past Surgical History  Procedure Laterality Date  . Cyst removed from left breast  1970  . Left wrist surgery   2004   with plate  . Colonoscopy    . Esophagogastroduodenoscopy    . Mastectomy, radical    . Mastectomy modified radical  05/10/2012    Procedure: MASTECTOMY MODIFIED RADICAL;  Surgeon: PMerrie Roof MD;  [place] [facility]  Service: General;  Laterality: Right;  RIGHT MODIFIED RADICAL MASTECTOMY  . Portacath placement  05/10/2012    Procedure: INSERTION PORT-A-CATH;  Surgeon: PMerrie Roof MD;  [facility]  Service: General;  Laterality: Left;  . Decompressive lumbar laminectomy level 4 N/A 02/15/2015    Procedure: DECOMPRESSION T5 AND T3-T7 STABALIZATION;  Surgeon: NConsuella Lose MD;  [facility];  Service: Neurosurgery;  Laterality: N/A;  . Laminectomy N/A 03/27/2015    Procedure: Thoracic Four-Thoracic Six Laminectomy for tumor;  Surgeon: NConsuella Lose MD;  [facility];  Service: Neurosurgery;  Laterality: N/A;  T4-T6 Laminectomy    There were no vitals filed for this visit.  Visit Diagnosis:  Abnormality of gait  Weakness of both lower extremities  Incomplete spinal cord lesion at T1-T6 level without bone injury, initial encounter [place]  Pain  Subjective Assessment - 07/01/15 0832    Subjective no new complaints; got a new w/c cushion yesterday.  having some hip pain and did some stretches this AM.  took pain pill and hips feel better.   Currently in Pain? Yes   Pain Score 3    Pain Location Chest   Pain Descriptors / Indicators Aching   Pain Type Chronic pain   Pain Radiating Towards around chest   Pain Onset More than a month ago   Pain Frequency Constant   Aggravating Factors  activity   Pain Relieving Factors heat, rest                         [facility] Adult PT Treatment/Exercise - 07/01/15 0837    Transfers   Transfers Sit to Stand;Stand to Sit   Sit to Stand 4: Min guard   Stand to Sit 3: Mod assist   Stand to Sit Details to control descent due to fatigue   Ambulation/Gait   Ambulation/Gait Yes    Ambulation/Gait Assistance 3: Mod assist   Ambulation/Gait Assistance Details with w/c follow and 1 episode of knee buckling due to fatigue.     Ambulation Distance (Feet) 35 Feet   Assistive device Rolling walker   Gait Pattern Step-to pattern;Decreased hip/knee flexion - right;Right genu recurvatum;Left genu recurvatum   Ambulation Surface Level   Exercises   Exercises Lumbar   Lumbar Exercises: Supine   Ab Set 10 reps;4 seconds   Clam 20 reps   Bridge 10 reps   Bridge Limitations with ball squeeze and supervision   Large Ball Oblique Isometric 10 reps   Large Ball Oblique Isometric Limitations x10 bil; with minguard A for safety   Other Supine Lumbar Exercises alt UE flexion x 10 bil with ab set   Knee/Hip Exercises: Seated   Long Arc Quad Both;20 reps;Weights   Long Arc Quad Weight 5 lbs.   Other Seated Knee/Hip Exercises heel/toe raises x 20 bil   Marching Both;20 reps;Weights   Marching Limitations noticable fatigue; decreased R to no weight after 5 reps   Marching Weights 2 lbs.                PT Education - 07/01/15 [phone]    Education provided Yes   Education Details added bridging and abdominal oblique with physioball to HEP   Person(s) Educated Patient   Methods Explanation;Demonstration;Handout   Comprehension Verbalized understanding;Need further instruction;Returned demonstration          PT Short Term Goals - 06/29/15 1041    PT SHORT TERM GOAL #1   Title independent with initial HEP (07/24/15)   Status New   PT SHORT TERM GOAL #2   Title ambulate > 29' with RW with min A for improved mobility and household ambulation    Status New   PT SHORT TERM GOAL #3   Title perform sit to/from stand transfers with supervision for improved independence    Status New   PT SHORT TERM GOAL #4   Title improve gait velocity to > 0.5 ft/sec for improved function and mobility   Status New           PT Long Term Goals - 06/29/15 1041    PT LONG TERM GOAL #1    Title ambulate > 100' with supervision with RW for improved mobility (08/21/15)   Status New   PT LONG TERM GOAL #2   Title improve gait velocity to >  1.0 ft/sec for improved function and mobility    Status New   PT LONG TERM GOAL #3   Title stand unsupported x 5 min with supervision for improved lower extremity strength and function    Status New   PT LONG TERM GOAL #4   Title perform stand pivot transfers with supervision for improved function and independence   Status New               Plan - 07/01/15 6803    Clinical Impression Statement Pt reports limited compliance with HEP given at CIR and HHPT.  Reviewed appropriate exercises with pt today and recommended pt perform daily.  Discussed ways to continue to exercise even on days that she is sore or fatigued to decrease risk of overuse.  Pt verbalized understanding.   PT Next Visit Plan gait and standing tolerance; LE strengthening possibly using Leg press if appropriate per pt request; core/hip strengthening   Consulted and Agree with Plan of Care Patient        Problem List Patient Active Problem List   Diagnosis Date Noted  . Anemia of chronic disease 05/21/2015  . Metastatic cancer to spine ([place]) 03/27/2015  . Thrombocytopenia ([place]) 03/12/2015  . Prerenal azotemia 03/12/2015  . Pain   . Recurrent UTI--due to Klebsiella   . Paraplegia at T4 level ([place]) 02/20/2015  . Neurogenic bowel 02/20/2015  . Neurogenic bladder 02/20/2015  . Postoperative anemia due to acute blood loss 02/17/2015  . Spinal cord compression due to malignant neoplasm metastatic to spine ([place]) 02/15/2015  . Epidural mass 02/15/2015  . Thoracic spine tumor   . Pathologic fracture of vertebra 02/14/2015  . Pathologic fracture of thoracic vertebrae 02/14/2015  . Breast cancer metastasized to bone ([place]) 02/14/2015  . Hyperlipidemia 02/14/2015  . H. pylori infection   . HTN (hypertension) 04/17/2012  . Primary cancer of upper outer quadrant of  right female breast ([place]) 03/08/2012   Laureen Abrahams, PT, DPT 07/01/2015 9:25 AM  [facility] [address] [place], [place], [address] Phone: [phone]   Fax:  [phone]  Name: Heather [name] MRN: [id number] Date of Birth: 1945/08/27

## 2015-07-02 ENCOUNTER — Other Ambulatory Visit (HOSPITAL_BASED_OUTPATIENT_CLINIC_OR_DEPARTMENT_OTHER): Payer: Medicare Other

## 2015-07-02 ENCOUNTER — Ambulatory Visit: Payer: Medicare Other

## 2015-07-02 ENCOUNTER — Other Ambulatory Visit: Payer: Medicare Other

## 2015-07-02 ENCOUNTER — Ambulatory Visit: Payer: Medicare Other | Admitting: Hematology and Oncology

## 2015-07-02 ENCOUNTER — Ambulatory Visit (HOSPITAL_BASED_OUTPATIENT_CLINIC_OR_DEPARTMENT_OTHER): Payer: Medicare Other

## 2015-07-02 VITALS — BP 132/79 | HR 82 | Temp 98.3°F | Resp 18

## 2015-07-02 DIAGNOSIS — C50411 Malignant neoplasm of upper-outer quadrant of right female breast: Secondary | ICD-10-CM

## 2015-07-02 DIAGNOSIS — C7951 Secondary malignant neoplasm of bone: Principal | ICD-10-CM

## 2015-07-02 DIAGNOSIS — C50911 Malignant neoplasm of unspecified site of right female breast: Secondary | ICD-10-CM

## 2015-07-02 DIAGNOSIS — C50919 Malignant neoplasm of unspecified site of unspecified female breast: Secondary | ICD-10-CM

## 2015-07-02 DIAGNOSIS — Z5112 Encounter for antineoplastic immunotherapy: Secondary | ICD-10-CM

## 2015-07-02 LAB — COMPREHENSIVE METABOLIC PANEL
ALT: 17 U/L (ref 0–55)
AST: 20 U/L (ref 5–34)
Albumin: 3.4 g/dL — ABNORMAL LOW (ref 3.5–5.0)
Alkaline Phosphatase: 73 U/L (ref 40–150)
Anion Gap: 9 mEq/L (ref 3–11)
BUN: 17.4 mg/dL (ref 7.0–26.0)
CO2: 26 mEq/L (ref 22–29)
Calcium: 9 mg/dL (ref 8.4–10.4)
Chloride: 104 mEq/L (ref 98–109)
Creatinine: 1 mg/dL (ref 0.6–1.1)
EGFR: 57 mL/min/{1.73_m2} — ABNORMAL LOW (ref >90)
Glucose: 149 mg/dl — ABNORMAL HIGH (ref 70–140)
Potassium: 3.8 mEq/L (ref 3.5–5.1)
Sodium: 138 mEq/L (ref 136–145)
Total Bilirubin: 0.35 mg/dL (ref 0.20–1.20)
Total Protein: 6.8 g/dL (ref 6.4–8.3)

## 2015-07-02 LAB — CBC WITH DIFFERENTIAL/PLATELET
BASO%: 0.8 % (ref 0.0–2.0)
Basophils Absolute: 0 10*3/uL (ref 0.0–0.1)
EOS%: 0.8 % (ref 0.0–7.0)
Eosinophils Absolute: 0 10*3/uL (ref 0.0–0.5)
HCT: 35.2 % (ref 34.8–46.6)
HGB: 11 g/dL — ABNORMAL LOW (ref 11.6–15.9)
LYMPH%: 9.4 % — ABNORMAL LOW (ref 14.0–49.7)
MCH: 24.7 pg — ABNORMAL LOW (ref 25.1–34.0)
MCHC: 31.4 g/dL — ABNORMAL LOW (ref 31.5–36.0)
MCV: 78.7 fL — ABNORMAL LOW (ref 79.5–101.0)
MONO#: 0.3 10*3/uL (ref 0.1–0.9)
MONO%: 7.5 % (ref 0.0–14.0)
NEUT#: 3.7 10*3/uL (ref 1.5–6.5)
NEUT%: 81.5 % — ABNORMAL HIGH (ref 38.4–76.8)
Platelets: 203 10*3/uL (ref 145–400)
RBC: 4.46 10*6/uL (ref 3.70–5.45)
RDW: 17.4 % — ABNORMAL HIGH (ref 11.2–14.5)
WBC: 4.6 10*3/uL (ref 3.9–10.3)
lymph#: 0.4 10*3/uL — ABNORMAL LOW (ref 0.9–3.3)

## 2015-07-02 MED ORDER — DIPHENHYDRAMINE HCL 25 MG PO CAPS
25.0000 mg | ORAL_CAPSULE | Freq: Once | ORAL | Status: AC
  Administered 2015-07-02: 25 mg via ORAL

## 2015-07-02 MED ORDER — DIPHENHYDRAMINE HCL 25 MG PO CAPS
ORAL_CAPSULE | ORAL | Status: AC
Start: 2015-07-02 — End: 2015-07-02
  Filled 2015-07-02: qty 1

## 2015-07-02 MED ORDER — ACETAMINOPHEN 325 MG PO TABS
ORAL_TABLET | ORAL | Status: AC
  Filled 2015-07-02: qty 2

## 2015-07-02 MED ORDER — SODIUM CHLORIDE 0.9 % IJ SOLN
10.0000 mL | INTRAMUSCULAR | Status: DC | PRN
  Administered 2015-07-02: 10 mL
  Filled 2015-07-02: qty 10

## 2015-07-02 MED ORDER — SODIUM CHLORIDE 0.9% FLUSH
10.0000 mL | INTRAVENOUS | Status: DC | PRN
  Administered 2015-07-02: 10 mL via INTRAVENOUS
  Filled 2015-07-02: qty 10

## 2015-07-02 MED ORDER — ACETAMINOPHEN 325 MG PO TABS
650.0000 mg | ORAL_TABLET | Freq: Once | ORAL | Status: AC
  Administered 2015-07-02: 650 mg via ORAL

## 2015-07-02 MED ORDER — HEPARIN SOD (PORK) LOCK FLUSH 100 UNIT/ML IV SOLN
500.0000 [IU] | Freq: Once | INTRAVENOUS | Status: AC | PRN
  Administered 2015-07-02: 500 [IU]
  Filled 2015-07-02: qty 5

## 2015-07-02 MED ORDER — SODIUM CHLORIDE 0.9 % IV SOLN
6.0000 mg/kg | Freq: Once | INTRAVENOUS | Status: AC
  Administered 2015-07-02: 399 mg via INTRAVENOUS
  Filled 2015-07-02: qty 19

## 2015-07-02 MED ORDER — SODIUM CHLORIDE 0.9 % IV SOLN
Freq: Once | INTRAVENOUS | Status: AC
  Administered 2015-07-02: 10:00:00 via INTRAVENOUS

## 2015-07-02 NOTE — Patient Instructions (Signed)

## 2015-07-02 NOTE — Telephone Encounter (Signed)
Form filled out per Dr. Charm Barges request and faxed over to [facility]

## 2015-07-02 NOTE — Patient Instructions (Signed)
Trastuzumab injection for infusion What is this medicine? TRASTUZUMAB (tras TOO zoo mab) is a monoclonal antibody. It is used to treat breast cancer and stomach cancer. This medicine may be used for other purposes; ask your health care provider or pharmacist if you have questions. What should I tell my health care provider before I take this medicine? They need to know if you have any of these conditions: -heart disease -heart failure -infection (especially a virus infection such as chickenpox, cold sores, or herpes) -lung or breathing disease, like asthma -recent or ongoing radiation therapy -an unusual or allergic reaction to trastuzumab, benzyl alcohol, or other medications, foods, dyes, or preservatives -pregnant or trying to get pregnant -breast-feeding How should I use this medicine? This drug is given as an infusion into a vein. It is administered in a hospital or clinic by a specially trained health care professional. Talk to your pediatrician regarding the use of this medicine in children. This medicine is not approved for use in children. Overdosage: If you think you have taken too much of this medicine contact a poison control center or emergency room at once. NOTE: This medicine is only for you. Do not share this medicine with others. What if I miss a dose? It is important not to miss a dose. Call your doctor or health care professional if you are unable to keep an appointment. What may interact with this medicine? -doxorubicin -warfarin This list may not describe all possible interactions. Give your health care provider a list of all the medicines, herbs, non-prescription drugs, or dietary supplements you use. Also tell them if you smoke, drink alcohol, or use illegal drugs. Some items may interact with your medicine. What should I watch for while using this medicine? Visit your doctor for checks on your progress. Report any side effects. Continue your course of treatment even  though you feel ill unless your doctor tells you to stop. Call your doctor or health care professional for advice if you get a fever, chills or sore throat, or other symptoms of a cold or flu. Do not treat yourself. Try to avoid being around people who are sick. You may experience fever, chills and shaking during your first infusion. These effects are usually mild and can be treated with other medicines. Report any side effects during the infusion to your health care professional. Fever and chills usually do not happen with later infusions. Do not become pregnant while taking this medicine or for 7 months after stopping it. Women should inform their doctor if they wish to become pregnant or think they might be pregnant. Women of child-bearing potential will need to have a negative pregnancy test before starting this medicine. There is a potential for serious side effects to an unborn child. Talk to your health care professional or pharmacist for more information. Do not breast-feed an infant while taking this medicine or for 7 months after stopping it. Women must use effective birth control with this medicine. What side effects may I notice from receiving this medicine? Side effects that you should report to your doctor or other health care professional as soon as possible: -breathing difficulties -chest pain or palpitations -cough -dizziness or fainting -fever or chills, sore throat -skin rash, itching or hives -swelling of the legs or ankles -unusually weak or tired Side effects that usually do not require medical attention (report to your doctor or other health care professional if they continue or are bothersome): -loss of appetite -headache -muscle aches -nausea This  list may not describe all possible side effects. Call your doctor for medical advice about side effects. You may report side effects to FDA at 1-800-FDA-1088. Where should I keep my medicine? This drug is given in a hospital  or clinic and will not be stored at home. NOTE: This sheet is a summary. It may not cover all possible information. If you have questions about this medicine, talk to your doctor, pharmacist, or health care provider.    2016, Elsevier/Gold Standard. (2014-08-01 11:49:32)

## 2015-07-06 ENCOUNTER — Ambulatory Visit: Payer: Medicare Other | Admitting: Physical Therapy

## 2015-07-06 ENCOUNTER — Other Ambulatory Visit: Payer: Self-pay | Admitting: Hematology and Oncology

## 2015-07-06 DIAGNOSIS — R29898 Other symptoms and signs involving the musculoskeletal system: Secondary | ICD-10-CM | POA: Diagnosis not present

## 2015-07-06 DIAGNOSIS — R269 Unspecified abnormalities of gait and mobility: Secondary | ICD-10-CM

## 2015-07-06 DIAGNOSIS — R52 Pain, unspecified: Secondary | ICD-10-CM | POA: Diagnosis not present

## 2015-07-06 DIAGNOSIS — S24151A Other incomplete lesion at T1 level of thoracic spinal cord, initial encounter: Secondary | ICD-10-CM

## 2015-07-06 NOTE — Therapy (Signed)
[facility] [address] [place], [place], [address] Phone: [phone]   Fax:  [phone]  Physical Therapy Treatment  Patient Details  Name: Heather [name] MRN: [id number] Date of Birth: 12/22/1945 Referring Provider: Alger Simons, MD  Encounter Date: 07/06/2015      PT End of Session - 07/06/15 1154    Visit Number 4   Number of Visits 24   Date for PT Re-Evaluation 08/21/15   PT Start Time 0930   PT Stop Time 1016   PT Time Calculation (min) 46 min   Equipment Utilized During Treatment Gait belt   Activity Tolerance Patient tolerated treatment well   Behavior During Therapy [facility] for tasks assessed/performed      Past Medical History  Diagnosis Date  . Chronic kidney disease   . Hx: UTI (urinary tract infection)   . Right shoulder pain   . Right knee pain   . Hypertension     recently started Aldactone   . Hyperlipidemia     but not on meds;diet and exercise controlled  . Dizziness     has been going on for 83month;medical MD aware  . H/O hiatal hernia   . GERD (gastroesophageal reflux disease)     Tums prn  . Hemorrhoids   . Urinary frequency     d/t taking Aldactone  . History of UTI   . Anemia     hx of  . Cataract     immature-not sure of which eye  . Anxiety     r/t updated surgery  . Insomnia     takes Melatoniin daily  . [place]     right  . Skin cancer   . Hx of radiation therapy 10/25/12- 12/11/12    right chest wall/regional lymph nodes 5040 cGy, 28 sessions, right chest wall boost 1000 cGy 1 session  . H. pylori infection   . Metastasis to spinal column (HCC)     T5  . Complication of anesthesia     pt states she is sensitive to meds  . PONV (postoperative nausea and vomiting)     pt experienced hair loss, confusion and combative- 02/15/15    Past Surgical History  Procedure Laterality Date  . Cyst removed from left breast  1970  . Left wrist surgery   2004     with plate  . Colonoscopy    . Esophagogastroduodenoscopy    . Mastectomy, radical    . Mastectomy modified radical  05/10/2012    Procedure: MASTECTOMY MODIFIED RADICAL;  Surgeon: PMerrie Roof MD;  [place] [facility]  Service: General;  Laterality: Right;  RIGHT MODIFIED RADICAL MASTECTOMY  . Portacath placement  05/10/2012    Procedure: INSERTION PORT-A-CATH;  Surgeon: PMerrie Roof MD;  [place] [facility]  Service: General;  Laterality: Left;  . Decompressive lumbar laminectomy level 4 N/A 02/15/2015    Procedure: DECOMPRESSION T5 AND T3-T7 STABALIZATION;  Surgeon: NConsuella Lose MD;  [facility];  Service: Neurosurgery;  Laterality: N/A;  . Laminectomy N/A 03/27/2015    Procedure: Thoracic Four-Thoracic Six Laminectomy for tumor;  Surgeon: NConsuella Lose MD;  [facility];  Service: Neurosurgery;  Laterality: N/A;  T4-T6 Laminectomy    There were no vitals filed for this visit.  Visit Diagnosis:  Abnormality of gait  Weakness of both lower extremities  Incomplete spinal cord lesion at T1-T6 level without bone injury, initial encounter ([place]  Pain  Subjective Assessment - 07/06/15 0933    Subjective no new complaints; still awaiting braces   Currently in Pain? --  c/o R hip pain; did not rate                         [facility] Adult PT Treatment/Exercise - 07/06/15 0937    Ambulation/Gait   Ambulation/Gait Yes   Ambulation/Gait Assistance 4: Min assist   Ambulation/Gait Assistance Details with w/c follow; decreased posterior lean and 2 episodes of buckling able to correct with UE support and min A   Ambulation Distance (Feet) 50 Feet   Assistive device Rolling walker   Gait Pattern Step-to pattern;Decreased hip/knee flexion - right;Right genu recurvatum;Left genu recurvatum   Ambulation Surface Level;Indoor   Gait Comments used AFO on LLE for trial; pt states she felt increased stability on LLE; still with reliance on recurvatum    Knee/Hip Exercises: Supine   Hip Adduction Isometric Both;10 reps   Straight Leg Raises Strengthening;Both;10 reps   Straight Leg Raises Limitations min A for RLE   Other Supine Knee/Hip Exercises single limb clamshell with red tband x 10; clamshells with red tband x 10   Other Supine Knee/Hip Exercises lower trunk rotation x 5 bil; alt hooklying marching x 10                  PT Short Term Goals - 06/29/15 1041    PT SHORT TERM GOAL #1   Title independent with initial HEP (07/24/15)   Status New   PT SHORT TERM GOAL #2   Title ambulate > 44' with RW with min A for improved mobility and household ambulation    Status New   PT SHORT TERM GOAL #3   Title perform sit to/from stand transfers with supervision for improved independence    Status New   PT SHORT TERM GOAL #4   Title improve gait velocity to > 0.5 ft/sec for improved function and mobility   Status New           PT Long Term Goals - 06/29/15 1041    PT LONG TERM GOAL #1   Title ambulate > 100' with supervision with RW for improved mobility (08/21/15)   Status New   PT LONG TERM GOAL #2   Title improve gait velocity to > 1.0 ft/sec for improved function and mobility    Status New   PT LONG TERM GOAL #3   Title stand unsupported x 5 min with supervision for improved lower extremity strength and function    Status New   PT LONG TERM GOAL #4   Title perform stand pivot transfers with supervision for improved function and independence   Status New               Plan - 07/06/15 1154    Clinical Impression Statement Pt states she felt increased weakness and decreased standing tolerance over weekend; no significant changes noted today.  Will continue to monitor and address PRN.  Pt amb 50' with L AFO today which improved ankle stability.  Pt scheduled to receive bil AFOs on Wednesday prior to PT.   PT Next Visit Plan gait and standing tolerance; LE strengthening possibly using Leg press if appropriate  per pt request; core/hip strengthening   Consulted and Agree with Plan of Care Patient        Problem List Patient Active Problem List   Diagnosis Date Noted  . Anemia of chronic  disease 05/21/2015  . Metastatic cancer to spine ([place]) 03/27/2015  . Thrombocytopenia ([place]) 03/12/2015  . Prerenal azotemia 03/12/2015  . Pain   . Recurrent UTI--due to Klebsiella   . Paraplegia at T4 level ([place]) 02/20/2015  . Neurogenic bowel 02/20/2015  . Neurogenic bladder 02/20/2015  . Postoperative anemia due to acute blood loss 02/17/2015  . Spinal cord compression due to malignant neoplasm metastatic to spine ([place]) 02/15/2015  . Epidural mass 02/15/2015  . Thoracic spine tumor   . Pathologic fracture of vertebra 02/14/2015  . Pathologic fracture of thoracic vertebrae 02/14/2015  . Breast cancer metastasized to bone ([place]) 02/14/2015  . Hyperlipidemia 02/14/2015  . H. pylori infection   . HTN (hypertension) 04/17/2012  . Primary cancer of upper outer quadrant of right female breast ([place]) 03/08/2012   Laureen Abrahams, PT, DPT 07/06/2015 11:56 AM  [facility] [address] [place], [place], [address] Phone: [phone]   Fax:  [phone]  Name: Heather [name] MRN: [id number] Date of Birth: Mar 05, 1946

## 2015-07-08 ENCOUNTER — Encounter: Payer: Self-pay | Admitting: *Deleted

## 2015-07-08 ENCOUNTER — Ambulatory Visit: Payer: Medicare Other | Attending: Physical Medicine & Rehabilitation | Admitting: *Deleted

## 2015-07-08 DIAGNOSIS — G839 Paralytic syndrome, unspecified: Secondary | ICD-10-CM | POA: Diagnosis not present

## 2015-07-08 DIAGNOSIS — M6289 Other specified disorders of muscle: Secondary | ICD-10-CM | POA: Insufficient documentation

## 2015-07-08 DIAGNOSIS — S24151A Other incomplete lesion at T1 level of thoracic spinal cord, initial encounter: Secondary | ICD-10-CM | POA: Diagnosis not present

## 2015-07-08 DIAGNOSIS — X58XXXA Exposure to other specified factors, initial encounter: Secondary | ICD-10-CM | POA: Insufficient documentation

## 2015-07-08 DIAGNOSIS — R52 Pain, unspecified: Secondary | ICD-10-CM | POA: Insufficient documentation

## 2015-07-08 DIAGNOSIS — R269 Unspecified abnormalities of gait and mobility: Secondary | ICD-10-CM | POA: Insufficient documentation

## 2015-07-08 DIAGNOSIS — R29898 Other symptoms and signs involving the musculoskeletal system: Secondary | ICD-10-CM | POA: Diagnosis not present

## 2015-07-08 DIAGNOSIS — Z7409 Other reduced mobility: Secondary | ICD-10-CM | POA: Diagnosis not present

## 2015-07-08 NOTE — Therapy (Signed)
[facility] [address] [place], [place], [address] Phone: [phone]   Fax:  [phone]  Physical Therapy Treatment  Patient Details  Name: Heather [name] MRN: [id number] Date of Birth: 1945-06-27 Referring Provider: Alger Simons, MD  Encounter Date: 07/08/2015      PT End of Session - 07/08/15 1522    Visit Number 5   Number of Visits 24   Date for PT Re-Evaluation 08/21/15   PT Start Time 5176   PT Stop Time 1400   PT Time Calculation (min) 43 min   Equipment Utilized During Treatment Gait belt   Activity Tolerance Patient tolerated treatment well   Behavior During Therapy [facility] for tasks assessed/performed      Past Medical History  Diagnosis Date  . Chronic kidney disease   . Hx: UTI (urinary tract infection)   . Right shoulder pain   . Right knee pain   . Hypertension     recently started Aldactone   . Hyperlipidemia     but not on meds;diet and exercise controlled  . Dizziness     has been going on for 51month;medical MD aware  . H/O hiatal hernia   . GERD (gastroesophageal reflux disease)     Tums prn  . Hemorrhoids   . Urinary frequency     d/t taking Aldactone  . History of UTI   . Anemia     hx of  . Cataract     immature-not sure of which eye  . Anxiety     r/t updated surgery  . Insomnia     takes Melatoniin daily  . [place] cancer ([place]     right  . Skin cancer   . Hx of radiation therapy 10/25/12- 12/11/12    right chest wall/regional lymph nodes 5040 cGy, 28 sessions, right chest wall boost 1000 cGy 1 session  . H. pylori infection   . Metastasis to spinal column (HCC)     T5  . Complication of anesthesia     pt states she is sensitive to meds  . PONV (postoperative nausea and vomiting)     pt experienced hair loss, confusion and combative- 02/15/15    Past Surgical History  Procedure Laterality Date  . Cyst removed from left breast  1970  . Left wrist surgery   2004   with plate  . Colonoscopy    . Esophagogastroduodenoscopy    . Mastectomy, radical    . Mastectomy modified radical  05/10/2012    Procedure: MASTECTOMY MODIFIED RADICAL;  Surgeon: PMerrie Roof MD;  [place] [facility]  Service: General;  Laterality: Right;  RIGHT MODIFIED RADICAL MASTECTOMY  . Portacath placement  05/10/2012    Procedure: INSERTION PORT-A-CATH;  Surgeon: PMerrie Roof MD;  [facility]  Service: General;  Laterality: Left;  . Decompressive lumbar laminectomy level 4 N/A 02/15/2015    Procedure: DECOMPRESSION T5 AND T3-T7 STABALIZATION;  Surgeon: NConsuella Lose MD;  [facility];  Service: Neurosurgery;  Laterality: N/A;  . Laminectomy N/A 03/27/2015    Procedure: Thoracic Four-Thoracic Six Laminectomy for tumor;  Surgeon: NConsuella Lose MD;  [facility];  Service: Neurosurgery;  Laterality: N/A;  T4-T6 Laminectomy    There were no vitals filed for this visit.  Visit Diagnosis:  Abnormality of gait  Weakness of both lower extremities  Incomplete spinal cord lesion at T1-T6 level without bone injury, initial encounter ([place]  Subjective Assessment - 07/08/15 1329    Subjective Reports not having the braces and that Orthotist continues to need to make adjustments to the braces.   Limitations House hold activities;Standing;Walking   How long can you walk comfortably? first time 2/13: 30-40 ft with RW   Pain Score 3    Pain Location Chest   Pain Orientation Left   Pain Descriptors / Indicators Aching   Pain Type Chronic pain   Pain Onset More than a month ago   Pain Frequency Constant                         [facility] Adult PT Treatment/Exercise - 07/08/15 0001    Ambulation/Gait   Ambulation/Gait Yes   Ambulation/Gait Assistance 4: Min assist  W/C following   Ambulation/Gait Assistance Details with w/c follow; decreased posterior lean and 2 episodes of buckling able to correct with UE support and min A    Ambulation Distance (Feet) 24 Feet  +25   Assistive device Rolling walker   Gait Pattern Step-to pattern;Decreased hip/knee flexion - right;Right genu recurvatum;Left genu recurvatum   Ambulation Surface Level;Indoor   Knee/Hip Exercises: Aerobic   Other Aerobic Sci Fit L =3 to L2 UE/LE seat 5, 8 min  RPM maintaining around 67   Knee/Hip Exercises: Machines for Strengthening   Total Gym Leg Press #50 x10, #60x10  AA for bilateral knee control in extension                PT Education - 07/08/15 1521    Education provided Yes   Education Details How to use Herbalist) Educated Patient   Methods Explanation;Tactile cues;Verbal cues   Comprehension Verbalized understanding;Returned demonstration;Verbal cues required;Need further instruction          PT Short Term Goals - 06/29/15 1041    PT SHORT TERM GOAL #1   Title independent with initial HEP (07/24/15)   Status New   PT SHORT TERM GOAL #2   Title ambulate > 79' with RW with min A for improved mobility and household ambulation    Status New   PT SHORT TERM GOAL #3   Title perform sit to/from stand transfers with supervision for improved independence    Status New   PT SHORT TERM GOAL #4   Title improve gait velocity to > 0.5 ft/sec for improved function and mobility   Status New           PT Long Term Goals - 06/29/15 1041    PT LONG TERM GOAL #1   Title ambulate > 100' with supervision with RW for improved mobility (08/21/15)   Status New   PT LONG TERM GOAL #2   Title improve gait velocity to > 1.0 ft/sec for improved function and mobility    Status New   PT LONG TERM GOAL #3   Title stand unsupported x 5 min with supervision for improved lower extremity strength and function    Status New   PT LONG TERM GOAL #4   Title perform stand pivot transfers with supervision for improved function and independence   Status New               Plan - 07/08/15 1524    Clinical  Impression Statement Sklled session worked on activity tolerance with gait and LE strengthening.  Pt performed well with strengthening machines with cues for technique and review on what muscle groups to focus on  with HEP. Pt continues to wait for brace adjustments.                                                    PT Next Visit Plan gait and standing tolerance; LE strengthening possibly using Leg press if appropriate per pt request; core/hip strengthening   Consulted and Agree with Plan of Care Patient        Problem List Patient Active Problem List   Diagnosis Date Noted  . Anemia of chronic disease 05/21/2015  . Metastatic cancer to spine ([place]) 03/27/2015  . Thrombocytopenia ([place]) 03/12/2015  . Prerenal azotemia 03/12/2015  . Pain   . Recurrent UTI--due to Klebsiella   . Paraplegia at T4 level ([place]) 02/20/2015  . Neurogenic bowel 02/20/2015  . Neurogenic bladder 02/20/2015  . Postoperative anemia due to acute blood loss 02/17/2015  . Spinal cord compression due to malignant neoplasm metastatic to spine ([place]) 02/15/2015  . Epidural mass 02/15/2015  . Thoracic spine tumor   . Pathologic fracture of vertebra 02/14/2015  . Pathologic fracture of thoracic vertebrae 02/14/2015  . Breast cancer metastasized to bone ([place]) 02/14/2015  . Hyperlipidemia 02/14/2015  . H. pylori infection   . HTN (hypertension) 04/17/2012  . Primary cancer of upper outer quadrant of right female breast ([place]) 03/08/2012    Bjorn Loser, PTA  07/08/2015, 3:29 PM [facility] [address] [place], [place], [address] Phone: [phone]   Fax:  [phone]  Name: Heather [name] MRN: [id number] Date of Birth: Sep 20, 1945

## 2015-07-10 ENCOUNTER — Ambulatory Visit: Payer: Medicare Other | Admitting: *Deleted

## 2015-07-10 ENCOUNTER — Ambulatory Visit: Payer: Medicare Other | Admitting: Physical Therapy

## 2015-07-10 DIAGNOSIS — R52 Pain, unspecified: Secondary | ICD-10-CM

## 2015-07-10 DIAGNOSIS — S24151A Other incomplete lesion at T1 level of thoracic spinal cord, initial encounter: Secondary | ICD-10-CM

## 2015-07-10 DIAGNOSIS — R269 Unspecified abnormalities of gait and mobility: Secondary | ICD-10-CM

## 2015-07-10 DIAGNOSIS — Z7409 Other reduced mobility: Secondary | ICD-10-CM | POA: Diagnosis not present

## 2015-07-10 DIAGNOSIS — M6289 Other specified disorders of muscle: Secondary | ICD-10-CM | POA: Diagnosis not present

## 2015-07-10 DIAGNOSIS — G8220 Paraplegia, unspecified: Secondary | ICD-10-CM

## 2015-07-10 DIAGNOSIS — R29898 Other symptoms and signs involving the musculoskeletal system: Secondary | ICD-10-CM | POA: Diagnosis not present

## 2015-07-10 DIAGNOSIS — R6889 Other general symptoms and signs: Secondary | ICD-10-CM

## 2015-07-10 DIAGNOSIS — R531 Weakness: Secondary | ICD-10-CM

## 2015-07-10 NOTE — Therapy (Signed)
[facility] [address] [place], [place], [address] Phone: [phone]   Fax:  [phone]  Occupational Therapy Evaluation  Patient Details  Name: Heather [name] MRN: [id number] Date of Birth: 1945-12-19 Referring Provider: Dr. Naaman Plummer   Encounter Date: 07/10/2015      OT End of Session - 07/10/15 1337    Visit Number 1   Number of Visits 17   Date for OT Re-Evaluation 09/04/15   Authorization Type medicare - g code needed every 10 visits   Authorization - Visit Number 1   Authorization - Number of Visits 10   OT Start Time 0920   OT Stop Time 1015   OT Time Calculation (min) 55 min   Activity Tolerance Patient tolerated treatment well   Behavior During Therapy [facility] for tasks assessed/performed      Past Medical History  Diagnosis Date  . Chronic kidney disease   . Hx: UTI (urinary tract infection)   . Right shoulder pain   . Right knee pain   . Hypertension     recently started Aldactone   . Hyperlipidemia     but not on meds;diet and exercise controlled  . Dizziness     has been going on for 43month;medical MD aware  . H/O hiatal hernia   . GERD (gastroesophageal reflux disease)     Tums prn  . Hemorrhoids   . Urinary frequency     d/t taking Aldactone  . History of UTI   . Anemia     hx of  . Cataract     immature-not sure of which eye  . Anxiety     r/t updated surgery  . Insomnia     takes Melatoniin daily  . Breast cancer ([place]     right  . Skin cancer   . Hx of radiation therapy 10/25/12- 12/11/12    right chest wall/regional lymph nodes 5040 cGy, 28 sessions, right chest wall boost 1000 cGy 1 session  . H. pylori infection   . Metastasis to spinal column (HCC)     T5  . Complication of anesthesia     pt states she is sensitive to meds  . PONV (postoperative nausea and vomiting)     pt experienced hair loss, confusion and combative- 02/15/15    Past Surgical History  Procedure  Laterality Date  . Cyst removed from left breast  1970  . Left wrist surgery   2004    with plate  . Colonoscopy    . Esophagogastroduodenoscopy    . Mastectomy, radical    . Mastectomy modified radical  05/10/2012    Procedure: MASTECTOMY MODIFIED RADICAL;  Surgeon: PMerrie Roof MD;  [facility]  Service: General;  Laterality: Right;  RIGHT MODIFIED RADICAL MASTECTOMY  . Portacath placement  05/10/2012    Procedure: INSERTION PORT-A-CATH;  Surgeon: PMerrie Roof MD;  [facility]  Service: General;  Laterality: Left;  . Decompressive lumbar laminectomy level 4 N/A 02/15/2015    Procedure: DECOMPRESSION T5 AND T3-T7 STABALIZATION;  Surgeon: NConsuella Lose MD;  [facility];  Service: Neurosurgery;  Laterality: N/A;  . Laminectomy N/A 03/27/2015    Procedure: Thoracic Four-Thoracic Six Laminectomy for tumor;  Surgeon: NConsuella Lose MD;  [facility];  Service: Neurosurgery;  Laterality: N/A;  T4-T6 Laminectomy    There were no vitals filed for this visit.  Visit Diagnosis:  Incomplete spinal cord lesion at T1-T6 level  without bone injury, initial encounter ([place]) - Plan: Ot plan of care cert/re-cert  Decreased strength, endurance, and mobility - Plan: Ot plan of care cert/re-cert  Paraplegia at T4 level [facility]) - Plan: Ot plan of care cert/re-cert      Subjective Assessment - 07/10/15 0927    Subjective  "Today I feel good, I had a bad day yesterday with a lot of pain".   Pertinent History Diagnosed with Breast CA Oct 2013, mastectomy Jan 2014. See Epic for more information.   Patient Stated Goals I want to be able to walk. I would love to be able to go into my kitchen and stand to clean my counters. I want to get off my blood thinner.    Currently in Pain? No/denies           [facility] OT Assessment - 07/10/15 0001    Assessment   Diagnosis T5 tumore resection; incomplete SCI   Referring Provider Dr. Naaman Plummer    Onset Date 03/27/15   Prior  Therapy CIR, HH   Precautions   Precautions Fall  pt reports, surgeon states no more back precautions   Restrictions   Weight Bearing Restrictions No   Balance Screen   Has the patient fallen in the past 6 months Yes   How many times? 3  pt reports knees buckling   Has the patient had a decrease in activity level because of a fear of falling?  No   Home  Environment   Family/patient expects to be discharged to: Private residence   Living Arrangements Spouse/significant other   Available Help at Discharge Available PRN/intermittently  husband may run errands, pr reports she doesnt get up alone   Bathroom Shower/Tub Actuary Reacher;Sock aid;Long-handled shoe horn;Long-handled sponge   Home Equipment Bedside commode;Tub bench  wide, drop arm   Lives With Spouse   Prior Function   Level of Independence Independent   Vocation Retired   Leisure exercise at [employer] 4-5 X per week, reading, gardening   ADL   Eating/Feeding Independent   Grooming Independent  sitting in w/c   Upper Body Bathing Modified independent  sitting on bench in shower or at sink   Lower Body Bathing Modified independent  lateral leans for peri care   Upper Body Dressing Set up  sittting in w/c   Lower Body Dressing Minimal assistance  standing using RW, husband pulling underwear and pants up   [place] assistance  pt stands and husband pulls up   [place] Transfer Supervision/safety   ADL comments Pt states her goal is to be able to stand for grooming tasks and stand to pull pants up/down without assistance from husband   IADL   Shopping Needs to be accompanied on any shopping trip  pt states "I just let my husband do it"   Light Housekeeping Does not participate in any housekeeping tasks   Meal Prep Needs to have meals prepared  and served  One of patient's goals, to be able to do this   Mobility   Mobility Status History of falls   Written Expression   Dominant Hand Right   Vision - History   Baseline Vision Wears glasses all the time   Vision Assessment   Eye Alignment Within Functional Limits   Activity Tolerance   Activity Tolerance Tolerates 30 min activity with muliple  rests   Sitting Balance Moves/returns trunkal midpoint > 2 inches in all planes   Cognition   Overall Cognitive Status Within Functional Limits for tasks assessed   Observation/Other Assessments   Observations Pt reports she has husband check buttock routinely   Sensation   Light Touch Appears Intact  BUEs   Coordination   Gross Motor Movements are Fluid and Coordinated Yes   Fine Motor Movements are Fluid and Coordinated Yes   ROM / Strength   AROM / PROM / Strength AROM;PROM;Strength   AROM   Overall AROM  Within functional limits for tasks performed   PROM   Overall PROM  Within functional limits for tasks performed   Strength   Overall Strength Comments Overall WFL, will benefit from a BUE HEP   Functional Reaching Activities   Low Level WFL   Mid Level WFL   High Level WFL           Treatment:  ADL - Education on safety with functional transfers, moving arm rest out of the way to decrease fall risk and ensure no skin breakdown. Pressure relief when sitting for longer periods of time for skin integrity, performing w/c pushups as needed as well. Correct technique for sit to/from stands, pushing up from surface she's sitting on, then reaching for RW and feeling back when sitting down.               OT Education - 07/10/15 1336    Education provided Yes   Education Details Pressure relief (including w/c pushups for relief), importance of checking skin(especially on buttock), safety with squat pivot transfers in/out of w/c (moving arm rest out of the way). Correct technique for sit to/from stands.    Person(s)  Educated Patient   Methods Explanation;Demonstration   Comprehension Verbalized understanding;Returned demonstration;Need further instruction          OT Short Term Goals - 07/10/15 1437    OT SHORT TERM GOAL #1   Title Pt will be independent with initial UE & core HEP   Time 4   Period Weeks   Status New   OT SHORT TERM GOAL #2   Title Pt will be independent with lateral leans to pull pants up/down for ADLs   Time 4   Period Weeks   Status New   OT SHORT TERM GOAL #3   Title Pt will be supervision in standing position with RW for support for pulling pants up/down during dressing and toileting needs   Time 4   Period Weeks   Status New   OT SHORT TERM GOAL #4   Title Pt will be able to stand for at least 10 minutes during ADL task with no rest break   Time 4   Period Weeks   Status New   OT SHORT TERM GOAL #5   Title Pt will complete simple meal prep or cooking activity in sit to/from stand position with supervision using RW prn   Time 4   Period Weeks   Status New           OT Long Term Goals - 07/10/15 1446    OT LONG TERM GOAL #1   Title Pt will be independent with upgraded UE & core HEP   Time 8   Period Weeks   Status New   OT LONG TERM GOAL #2   Title Pt will be mod I with shower stall transfers using shower bench and grab bars prn   Time  8   Period Weeks   Status New   OT LONG TERM GOAL #3   Title Pt will tolerate dynamic standing activity, task, or ADL for at least 20 minutes with only one seated rest break if needed    Time 8   Period Weeks   Status New   OT LONG TERM GOAL #4   Title Pt will be mod I with simple meal prep or cooking activity in sit to/from stand position using RW prn   Time 8   Period Weeks   Status New                                  Plan - 2015-08-03 1341    Clinical Impression Statement Pt is a 70 yo female s/p T5 resection of tumor with incomplete SCI. Pt with h/o breast CA, diagnosed 02/2012 and mastectomy  05/2012. Pt states she had spinal surgery 02/2015 prior to this most recent surgery 03/27/2015. Pt limited by decreased core/trunk control & support, decreased overall activity tolerance/endurance, decreased dynamic standing independence. Pt will benefit from skilled OT to increase ADL independence and overall strength & endurance to improve patient's overall quality of life.    Pt will benefit from skilled therapeutic intervention in order to improve on the following deficits (Retired) Decreased activity tolerance;Decreased balance;Decreased endurance;Decreased mobility;Decreased skin integrity;Decreased strength;Difficulty walking;Pain;Impaired tone;Improper spinal/pelvic alignment   Rehab Potential Good   Clinical Impairments Affecting Rehab Potential none known at this time   OT Frequency 2x / week   OT Duration 8 weeks   OT Treatment/Interventions Self-care/ADL training;Therapeutic exercise;Energy conservation;DME and/or AE instruction;Functional Mobility Training;Manual Therapy;Therapeutic exercises;Therapeutic activities;Patient/family education;Balance training;Electrical Stimulation;Passive range of motion   Plan Start UB HEP(including core strengthening), dynamic standing balance, activity tolerance/endurance   Recommended Other Services none at this time   Consulted and Agree with Plan of Care Patient          G-Codes - 2015-08-03 1500    Functional Assessment Tool Used Clinical judgement    Functional Limitation Self care   Self Care Current Status (K7425) At least 1 percent but less than 20 percent impaired, limited or restricted  supervision to min assist   Self Care Goal Status (Z5638) At least 1 percent but less than 20 percent impaired, limited or restricted  mod I to supervision      Problem List Patient Active Problem List   Diagnosis Date Noted  . Anemia of chronic disease 05/21/2015  . Metastatic cancer to spine ([place]) 03/27/2015  . Thrombocytopenia ([place]) 03/12/2015   . Prerenal azotemia 03/12/2015  . Pain   . Recurrent UTI--due to Klebsiella   . Paraplegia at T4 level ([place]) 02/20/2015  . Neurogenic bowel 02/20/2015  . Neurogenic bladder 02/20/2015  . Postoperative anemia due to acute blood loss 02/17/2015  . Spinal cord compression due to malignant neoplasm metastatic to spine ([place]) 02/15/2015  . Epidural mass 02/15/2015  . Thoracic spine tumor   . Pathologic fracture of vertebra 02/14/2015  . Pathologic fracture of thoracic vertebrae 02/14/2015  . Breast cancer metastasized to bone ([place]) 02/14/2015  . Hyperlipidemia 02/14/2015  . H. pylori infection   . HTN (hypertension) 04/17/2012  . Primary cancer of upper outer quadrant of right female breast ([place]) 03/08/2012    Chrys Racer , MS, OTR/L, CLT Pager: [phone]  08/03/2015, 3:12 PM  [place] [address] [place], [place], [address]  Phone: [phone]   Fax:  [phone]  Name: Heather [name] MRN: [id number] Date of Birth: 11-12-1945

## 2015-07-10 NOTE — Therapy (Signed)
[facility] [address] [place], [place], [address] Phone: [phone]   Fax:  [phone]  Physical Therapy Treatment  Patient Details  Name: Heather [name] MRN: [id number] Date of Birth: Feb 16, 1946 Referring Provider: Alger Simons, MD  Encounter Date: 07/10/2015      PT End of Session - 07/10/15 0917    Visit Number 6   Number of Visits 24   Date for PT Re-Evaluation 08/21/15   PT Start Time 0830   PT Stop Time 0913   PT Time Calculation (min) 43 min   Equipment Utilized During Treatment Gait belt   Activity Tolerance Patient tolerated treatment well   Behavior During Therapy [facility] for tasks assessed/performed      Past Medical History  Diagnosis Date  . Chronic kidney disease   . Hx: UTI (urinary tract infection)   . Right shoulder pain   . Right knee pain   . Hypertension     recently started Aldactone   . Hyperlipidemia     but not on meds;diet and exercise controlled  . Dizziness     has been going on for 60month;medical MD aware  . H/O hiatal hernia   . GERD (gastroesophageal reflux disease)     Tums prn  . Hemorrhoids   . Urinary frequency     d/t taking Aldactone  . History of UTI   . Anemia     hx of  . Cataract     immature-not sure of which eye  . Anxiety     r/t updated surgery  . Insomnia     takes Melatoniin daily  . Breast cancer ([place]     right  . Skin cancer   . Hx of radiation therapy 10/25/12- 12/11/12    right chest wall/regional lymph nodes 5040 cGy, 28 sessions, right chest wall boost 1000 cGy 1 session  . H. pylori infection   . Metastasis to spinal column (HCC)     T5  . Complication of anesthesia     pt states she is sensitive to meds  . PONV (postoperative nausea and vomiting)     pt experienced hair loss, confusion and combative- 02/15/15    Past Surgical History  Procedure Laterality Date  . Cyst removed from left breast  1970  . Left wrist surgery   2004   with plate  . Colonoscopy    . Esophagogastroduodenoscopy    . Mastectomy, radical    . Mastectomy modified radical  05/10/2012    Procedure: MASTECTOMY MODIFIED RADICAL;  Surgeon: PMerrie Roof MD;  [facility]: [facility]  Service: General;  Laterality: Right;  RIGHT MODIFIED RADICAL MASTECTOMY  . Portacath placement  05/10/2012    Procedure: INSERTION PORT-A-CATH;  Surgeon: PMerrie Roof MD;  [facility]  Service: General;  Laterality: Left;  . Decompressive lumbar laminectomy level 4 N/A 02/15/2015    Procedure: DECOMPRESSION T5 AND T3-T7 STABALIZATION;  Surgeon: NConsuella Lose MD;  [facility];  Service: Neurosurgery;  Laterality: N/A;  . Laminectomy N/A 03/27/2015    Procedure: Thoracic Four-Thoracic Six Laminectomy for tumor;  Surgeon: NConsuella Lose MD;  [facility];  Service: Neurosurgery;  Laterality: N/A;  T4-T6 Laminectomy    There were no vitals filed for this visit.  Visit Diagnosis:  Abnormality of gait  Weakness of both lower extremities  Incomplete spinal cord lesion at T1-T6 level without bone injury, initial encounter ([place]  Pain  Subjective Assessment - 07/10/15 0836    Subjective had some pain in L breast yesterday; nothing new. no pain today.   Currently in Pain? No/denies                         [facility] Adult PT Treatment/Exercise - 07/10/15 0837    Ambulation/Gait   Ambulation/Gait Yes   Ambulation/Gait Assistance 4: Min guard  up to mod A   Ambulation/Gait Assistance Details with w/c follow   Ambulation Distance (Feet) 50 Feet   Assistive device Rolling walker   Gait Pattern Step-to pattern;Decreased hip/knee flexion - right;Right genu recurvatum;Left genu recurvatum   Ambulation Surface Level;Indoor   Knee/Hip Exercises: Aerobic   Nustep L3 x 8 min   Knee/Hip Exercises: Machines for Strengthening   Total Gym Leg Press #50 x10, #60x10; 30# single limb x 10  gait belt to maintain neutral hip    Knee/Hip Exercises: Seated   Hamstring Curl Both;10 reps   Hamstring Limitations red theraband                  PT Short Term Goals - 06/29/15 1041    PT SHORT TERM GOAL #1   Title independent with initial HEP (07/24/15)   Status New   PT SHORT TERM GOAL #2   Title ambulate > 5' with RW with min A for improved mobility and household ambulation    Status New   PT SHORT TERM GOAL #3   Title perform sit to/from stand transfers with supervision for improved independence    Status New   PT SHORT TERM GOAL #4   Title improve gait velocity to > 0.5 ft/sec for improved function and mobility   Status New           PT Long Term Goals - 06/29/15 1041    PT LONG TERM GOAL #1   Title ambulate > 100' with supervision with RW for improved mobility (08/21/15)   Status New   PT LONG TERM GOAL #2   Title improve gait velocity to > 1.0 ft/sec for improved function and mobility    Status New   PT LONG TERM GOAL #3   Title stand unsupported x 5 min with supervision for improved lower extremity strength and function    Status New   PT LONG TERM GOAL #4   Title perform stand pivot transfers with supervision for improved function and independence   Status New               Plan - 07/10/15 4765    Clinical Impression Statement Pt with improved ambulation distance today with occasional episodes of buckling near end due to fatigue.  Progressing towards goals.   PT Next Visit Plan standing in parallel bars, gait and standing tolerance; LE strengthening possibly using Leg press if appropriate per pt request; core/hip strengthening   Consulted and Agree with Plan of Care Patient        Problem List Patient Active Problem List   Diagnosis Date Noted  . Anemia of chronic disease 05/21/2015  . Metastatic cancer to spine ([place]) 03/27/2015  . Thrombocytopenia ([place]) 03/12/2015  . Prerenal azotemia 03/12/2015  . Pain   . Recurrent UTI--due to Klebsiella   . Paraplegia at T4 level  ([place]) 02/20/2015  . Neurogenic bowel 02/20/2015  . Neurogenic bladder 02/20/2015  . Postoperative anemia due to acute blood loss 02/17/2015  . Spinal cord compression due to malignant neoplasm metastatic to spine (  [place]) 02/15/2015  . Epidural mass 02/15/2015  . Thoracic spine tumor   . Pathologic fracture of vertebra 02/14/2015  . Pathologic fracture of thoracic vertebrae 02/14/2015  . Breast cancer metastasized to bone ([place]) 02/14/2015  . Hyperlipidemia 02/14/2015  . H. pylori infection   . HTN (hypertension) 04/17/2012  . Primary cancer of upper outer quadrant of right female breast ([place]) 03/08/2012   Laureen Abrahams, PT, DPT 07/10/2015 9:19 AM  [facility] [address] [place], [place], [address] Phone: [phone]   Fax:  [phone]  Name: ANY MCNEICE MRN: [id number] Date of Birth: 08-01-1945

## 2015-07-13 ENCOUNTER — Other Ambulatory Visit: Payer: Self-pay | Admitting: Hematology and Oncology

## 2015-07-13 ENCOUNTER — Ambulatory Visit (HOSPITAL_COMMUNITY)
Admission: RE | Admit: 2015-07-13 | Discharge: 2015-07-13 | Disposition: A | Discharge status: Home / Self Care | Payer: Medicare Other | Source: Ambulatory Visit | Attending: Hematology and Oncology | Admitting: Hematology and Oncology

## 2015-07-13 ENCOUNTER — Ambulatory Visit: Payer: Medicare Other | Admitting: Physical Therapy

## 2015-07-13 DIAGNOSIS — R52 Pain, unspecified: Secondary | ICD-10-CM

## 2015-07-13 DIAGNOSIS — C50411 Malignant neoplasm of upper-outer quadrant of right female breast: Secondary | ICD-10-CM

## 2015-07-13 DIAGNOSIS — Z7409 Other reduced mobility: Secondary | ICD-10-CM

## 2015-07-13 DIAGNOSIS — R29898 Other symptoms and signs involving the musculoskeletal system: Secondary | ICD-10-CM | POA: Diagnosis not present

## 2015-07-13 DIAGNOSIS — G8220 Paraplegia, unspecified: Secondary | ICD-10-CM

## 2015-07-13 DIAGNOSIS — I340 Nonrheumatic mitral (valve) insufficiency: Secondary | ICD-10-CM | POA: Insufficient documentation

## 2015-07-13 DIAGNOSIS — S24151A Other incomplete lesion at T1 level of thoracic spinal cord, initial encounter: Secondary | ICD-10-CM

## 2015-07-13 DIAGNOSIS — M6289 Other specified disorders of muscle: Secondary | ICD-10-CM | POA: Diagnosis not present

## 2015-07-13 DIAGNOSIS — R269 Unspecified abnormalities of gait and mobility: Secondary | ICD-10-CM | POA: Diagnosis not present

## 2015-07-13 DIAGNOSIS — I10 Essential (primary) hypertension: Secondary | ICD-10-CM | POA: Diagnosis not present

## 2015-07-13 DIAGNOSIS — R531 Weakness: Secondary | ICD-10-CM

## 2015-07-13 DIAGNOSIS — R6889 Other general symptoms and signs: Secondary | ICD-10-CM

## 2015-07-13 NOTE — Progress Notes (Signed)
  Echocardiogram 2D Echocardiogram limited has been performed.  Tresa Res 07/13/2015, 11:42 AM

## 2015-07-13 NOTE — Therapy (Signed)
[facility] [address] [place], [place], [address] Phone: [phone]   Fax:  [phone]  Physical Therapy Treatment  Patient Details  Name: Heather [name] MRN: [id number] Date of Birth: 04/11/1946 Referring Provider: Alger Simons, MD  Encounter Date: 07/13/2015      PT End of Session - 07/13/15 1117    Visit Number 7   Number of Visits 24   Date for PT Re-Evaluation 08/21/15   PT Start Time 0930   PT Stop Time 1014   PT Time Calculation (min) 44 min   Equipment Utilized During Treatment Gait belt   Activity Tolerance Patient tolerated treatment well   Behavior During Therapy [facility] for tasks assessed/performed      Past Medical History  Diagnosis Date  . Chronic kidney disease   . Hx: UTI (urinary tract infection)   . Right shoulder pain   . Right knee pain   . Hypertension     recently started Aldactone   . Hyperlipidemia     but not on meds;diet and exercise controlled  . Dizziness     has been going on for 50month;medical MD aware  . H/O hiatal hernia   . GERD (gastroesophageal reflux disease)     Tums prn  . Hemorrhoids   . Urinary frequency     d/t taking Aldactone  . History of UTI   . Anemia     hx of  . Cataract     immature-not sure of which eye  . Anxiety     r/t updated surgery  . Insomnia     takes Melatoniin daily  . Breast cancer ([place]     right  . Skin cancer   . Hx of radiation therapy 10/25/12- 12/11/12    right chest wall/regional lymph nodes 5040 cGy, 28 sessions, right chest wall boost 1000 cGy 1 session  . H. pylori infection   . Metastasis to spinal column (HCC)     T5  . Complication of anesthesia     pt states she is sensitive to meds  . PONV (postoperative nausea and vomiting)     pt experienced hair loss, confusion and combative- 02/15/15    Past Surgical History  Procedure Laterality Date  . Cyst removed from left breast  1970  . Left wrist surgery   2004     with plate  . Colonoscopy    . Esophagogastroduodenoscopy    . Mastectomy, radical    . Mastectomy modified radical  05/10/2012    Procedure: MASTECTOMY MODIFIED RADICAL;  Surgeon: PMerrie Roof MD;  [facility]  Service: General;  Laterality: Right;  RIGHT MODIFIED RADICAL MASTECTOMY  . Portacath placement  05/10/2012    Procedure: INSERTION PORT-A-CATH;  Surgeon: PMerrie Roof MD;  [facility]  Service: General;  Laterality: Left;  . Decompressive lumbar laminectomy level 4 N/A 02/15/2015    Procedure: DECOMPRESSION T5 AND T3-T7 STABALIZATION;  Surgeon: NConsuella Lose MD;  [facility];  Service: Neurosurgery;  Laterality: N/A;  . Laminectomy N/A 03/27/2015    Procedure: Thoracic Four-Thoracic Six Laminectomy for tumor;  Surgeon: NConsuella Lose MD;  [facility];  Service: Neurosurgery;  Laterality: N/A;  T4-T6 Laminectomy    There were no vitals filed for this visit.  Visit Diagnosis:  Incomplete spinal cord lesion at T1-T6 level without bone injury, initial encounter ([place]  Decreased strength, endurance, and mobility  Paraplegia at T4 level ([place]  Abnormality of gait  Weakness of both lower extremities  Pain      Subjective Assessment - 07/13/15 0934    Subjective Doing well; did transfers this weekend without husband's assist   Currently in Pain? No/denies  "I'm okay today."                         [facility] Adult PT Treatment/Exercise - 07/13/15 0936    Knee/Hip Exercises: Seated   Long Arc Quad Both;20 reps;Weights   Long Arc Quad Weight 5 lbs.   Marching Both;20 reps;Weights   Marching Weights 5 lbs.  5# on LLE; 2# on RLE   Hamstring Curl Both;20 reps   Hamstring Limitations red theraband       Static standing in parallel bars and at counter top: weight shifting and forward/lateral reaching with minguard A; min cues for knee control to decrease buckling.  Utilized walk on AFOs for ankle stability in standing.     Squats with bil UE support and min cues for hip/knee stability x 20 with min A         PT Education - 07/13/15 1117    Education provided Yes   Education Details Educated on safe standing exercises and have w/c behind pt when standing at home   Person(s) Educated Patient   Methods Explanation   Comprehension Verbalized understanding          PT Short Term Goals - 06/29/15 1041    PT SHORT TERM GOAL #1   Title independent with initial HEP (07/24/15)   Status New   PT SHORT TERM GOAL #2   Title ambulate > 38' with RW with min A for improved mobility and household ambulation    Status New   PT [place] #3   Title perform sit to/from stand transfers with supervision for improved independence    Status New   PT SHORT TERM GOAL #4   Title improve gait velocity to > 0.5 ft/sec for improved function and mobility   Status New           PT Long Term Goals - 06/29/15 1041    PT LONG TERM GOAL #1   Title ambulate > 100' with supervision with RW for improved mobility (08/21/15)   Status New   PT LONG TERM GOAL #2   Title improve gait velocity to > 1.0 ft/sec for improved function and mobility    Status New   PT LONG TERM GOAL #3   Title stand unsupported x 5 min with supervision for improved lower extremity strength and function    Status New   PT LONG TERM GOAL #4   Title perform stand pivot transfers with supervision for improved function and independence   Status New               Plan - 07/13/15 1118    Clinical Impression Statement Pt tolerated standing exercises well today and feel with supervision pt safe to begin some standing activities at home.  Will continue to benefit from PT to maximize function.   PT Next Visit Plan standing in parallel bars, gait and standing tolerance; LE strengthening possibly using Leg press if appropriate per pt request; core/hip strengthening   Consulted and Agree with Plan of Care Patient        Problem  List Patient Active Problem List   Diagnosis Date Noted  . Anemia of chronic disease 05/21/2015  . Metastatic cancer to spine (  [place]) 03/27/2015  . Thrombocytopenia ([place]) 03/12/2015  . Prerenal azotemia 03/12/2015  . Pain   . Recurrent UTI--due to Klebsiella   . Paraplegia at T4 level ([place]) 02/20/2015  . Neurogenic bowel 02/20/2015  . Neurogenic bladder 02/20/2015  . Postoperative anemia due to acute blood loss 02/17/2015  . Spinal cord compression due to malignant neoplasm metastatic to spine ([place]) 02/15/2015  . Epidural mass 02/15/2015  . Thoracic spine tumor   . Pathologic fracture of vertebra 02/14/2015  . Pathologic fracture of thoracic vertebrae 02/14/2015  . Breast cancer metastasized to bone ([place]) 02/14/2015  . Hyperlipidemia 02/14/2015  . H. pylori infection   . HTN (hypertension) 04/17/2012  . Primary cancer of upper outer quadrant of right female breast ([place]) 03/08/2012   Laureen Abrahams, PT, DPT 07/13/2015 11:21 AM  [facility] [address] [place], [place], [address] Phone: [phone]   Fax:  [phone]  Name: Heather [name] MRN: [id number] Date of Birth: 1945-07-13

## 2015-07-15 ENCOUNTER — Ambulatory Visit: Payer: Medicare Other | Admitting: Physical Therapy

## 2015-07-15 DIAGNOSIS — R269 Unspecified abnormalities of gait and mobility: Secondary | ICD-10-CM

## 2015-07-15 DIAGNOSIS — G8220 Paraplegia, unspecified: Secondary | ICD-10-CM

## 2015-07-15 DIAGNOSIS — R52 Pain, unspecified: Secondary | ICD-10-CM | POA: Diagnosis not present

## 2015-07-15 DIAGNOSIS — Z7409 Other reduced mobility: Secondary | ICD-10-CM

## 2015-07-15 DIAGNOSIS — R531 Weakness: Secondary | ICD-10-CM

## 2015-07-15 DIAGNOSIS — S24151A Other incomplete lesion at T1 level of thoracic spinal cord, initial encounter: Secondary | ICD-10-CM

## 2015-07-15 DIAGNOSIS — M6289 Other specified disorders of muscle: Secondary | ICD-10-CM | POA: Diagnosis not present

## 2015-07-15 DIAGNOSIS — R6889 Other general symptoms and signs: Secondary | ICD-10-CM

## 2015-07-15 DIAGNOSIS — R29898 Other symptoms and signs involving the musculoskeletal system: Secondary | ICD-10-CM | POA: Diagnosis not present

## 2015-07-15 NOTE — Therapy (Signed)
[facility] [address] [place], [place], [address] Phone: [phone]   Fax:  [phone]  Physical Therapy Treatment  Patient Details  Name: Heather [name] MRN: [id number] Date of Birth: 05-17-1945 Referring Provider: Alger Simons, MD  Encounter Date: 07/15/2015      PT End of Session - 07/15/15 1016    Visit Number 8   Number of Visits 24   Date for PT Re-Evaluation 08/21/15   PT Start Time 0930   PT Stop Time 1010   PT Time Calculation (min) 40 min   Equipment Utilized During Treatment Gait belt   Activity Tolerance Patient tolerated treatment well   Behavior During Therapy [facility] for tasks assessed/performed      Past Medical History  Diagnosis Date  . Chronic kidney disease   . Hx: UTI (urinary tract infection)   . Right shoulder pain   . Right knee pain   . Hypertension     recently started Aldactone   . Hyperlipidemia     but not on meds;diet and exercise controlled  . Dizziness     has been going on for 16month;medical MD aware  . H/O hiatal hernia   . GERD (gastroesophageal reflux disease)     Tums prn  . Hemorrhoids   . Urinary frequency     d/t taking Aldactone  . History of UTI   . Anemia     hx of  . Cataract     immature-not sure of which eye  . Anxiety     r/t updated surgery  . Insomnia     takes Melatoniin daily  . Breast cancer ([place]     right  . Skin cancer   . Hx of radiation therapy 10/25/12- 12/11/12    right chest wall/regional lymph nodes 5040 cGy, 28 sessions, right chest wall boost 1000 cGy 1 session  . H. pylori infection   . Metastasis to spinal column (HCC)     T5  . Complication of anesthesia     pt states she is sensitive to meds  . PONV (postoperative nausea and vomiting)     pt experienced hair loss, confusion and combative- 02/15/15    Past Surgical History  Procedure Laterality Date  . Cyst removed from left breast  1970  . Left wrist surgery   2004   with plate  . Colonoscopy    . Esophagogastroduodenoscopy    . Mastectomy, radical    . Mastectomy modified radical  05/10/2012    Procedure: MASTECTOMY MODIFIED RADICAL;  Surgeon: PMerrie Roof MD;  [facility]  Service: General;  Laterality: Right;  RIGHT MODIFIED RADICAL MASTECTOMY  . Portacath placement  05/10/2012    Procedure: INSERTION PORT-A-CATH;  Surgeon: PMerrie Roof MD;  [facility]  Service: General;  Laterality: Left;  . Decompressive lumbar laminectomy level 4 N/A 02/15/2015    Procedure: DECOMPRESSION T5 AND T3-T7 STABALIZATION;  Surgeon: NConsuella Lose MD;  [facility];  Service: Neurosurgery;  Laterality: N/A;  . Laminectomy N/A 03/27/2015    Procedure: Thoracic Four-Thoracic Six Laminectomy for tumor;  Surgeon: NConsuella Lose MD;  [facility];  Service: Neurosurgery;  Laterality: N/A;  T4-T6 Laminectomy    There were no vitals filed for this visit.  Visit Diagnosis:  Incomplete spinal cord lesion at T1-T6 level without bone injury, initial encounter ([place]  Decreased strength, endurance, and mobility  Paraplegia at T4 level (HCC)  Abnormality of  gait  Weakness of both lower extremities      Subjective Assessment - 07/15/15 0929    Subjective did standing exercises yesterday; feels some hip pain (lateral and medial).  unsure if it's from working the muscles or not   Patient Stated Goals improve mobility; walk   Currently in Pain? Yes   Pain Score 0-No pain  was a 5/10 this morning   Pain Location Hip   Pain Orientation Right;Left  L>R   Pain Descriptors / Indicators Tender   Pain Type Acute pain        NMR:  Standing in parallel bars: squats 2x10 with bil UE support; static standing without UE support brief intervals (< 3 sec) with min A x 10; staggered stance with UE reaching and 1 UE support with min A   Seated on dynadisc: pelvic rocking x 10 ant/post and laterally; modified sit up x 10 without UE support;  LAQ 5# LLE / 3# RLE x 10 bil; marching x 10 bil no weight; hamstring curls x 10 bil with red theraband; hip abdct red theraband x 1 bil                         PT Short Term Goals - 06/29/15 1041    PT SHORT TERM GOAL #1   Title independent with initial HEP (07/24/15)   Status New   PT SHORT TERM GOAL #2   Title ambulate > 44' with RW with min A for improved mobility and household ambulation    Status New   PT SHORT TERM GOAL #3   Title perform sit to/from stand transfers with supervision for improved independence    Status New   PT SHORT TERM GOAL #4   Title improve gait velocity to > 0.5 ft/sec for improved function and mobility   Status New           PT Long Term Goals - 06/29/15 1041    PT LONG TERM GOAL #1   Title ambulate > 100' with supervision with RW for improved mobility (08/21/15)   Status New   PT LONG TERM GOAL #2   Title improve gait velocity to > 1.0 ft/sec for improved function and mobility    Status New   PT LONG TERM GOAL #3   Title stand unsupported x 5 min with supervision for improved lower extremity strength and function    Status New   PT LONG TERM GOAL #4   Title perform stand pivot transfers with supervision for improved function and independence   Status New               Plan - 07/15/15 1017    Clinical Impression Statement Pt with increased soreness after standing exercises in clinic and at home.  Motivated to participate in PT.  [name] conitnue to benefit from PT to improve function and independence.   PT Next Visit Plan standing in parallel bars, gait and standing tolerance; LE strengthening possibly using Leg press if appropriate per pt request; core/hip strengthening   Consulted and Agree with Plan of Care Patient        Problem List Patient Active Problem List   Diagnosis Date Noted  . Anemia of chronic disease 05/21/2015  . Metastatic cancer to spine ([place]) 03/27/2015  . Thrombocytopenia ([place]) 03/12/2015  .  Prerenal azotemia 03/12/2015  . Pain   . Recurrent UTI--due to Klebsiella   . Paraplegia at T4 level ([place]) 02/20/2015  .  Neurogenic bowel 02/20/2015  . Neurogenic bladder 02/20/2015  . Postoperative anemia due to acute blood loss 02/17/2015  . Spinal cord compression due to malignant neoplasm metastatic to spine ([place]) 02/15/2015  . Epidural mass 02/15/2015  . Thoracic spine tumor   . Pathologic fracture of vertebra 02/14/2015  . Pathologic fracture of thoracic vertebrae 02/14/2015  . Breast cancer metastasized to bone ([place]) 02/14/2015  . Hyperlipidemia 02/14/2015  . H. pylori infection   . HTN (hypertension) 04/17/2012  . Primary cancer of upper outer quadrant of right female breast ([place]) 03/08/2012   Laureen Abrahams, PT, DPT 07/15/2015 10:19 AM  [facility] [address] [place], [place], [address] Phone: [phone]   Fax:  [phone]  Name: Heather [name] MRN: [id number] Date of Birth: 1946-01-06

## 2015-07-17 ENCOUNTER — Ambulatory Visit: Payer: Medicare Other | Admitting: Physical Therapy

## 2015-07-17 DIAGNOSIS — R52 Pain, unspecified: Secondary | ICD-10-CM | POA: Diagnosis not present

## 2015-07-17 DIAGNOSIS — R269 Unspecified abnormalities of gait and mobility: Secondary | ICD-10-CM

## 2015-07-17 DIAGNOSIS — M6289 Other specified disorders of muscle: Secondary | ICD-10-CM | POA: Diagnosis not present

## 2015-07-17 DIAGNOSIS — R29898 Other symptoms and signs involving the musculoskeletal system: Secondary | ICD-10-CM | POA: Diagnosis not present

## 2015-07-17 DIAGNOSIS — S24151A Other incomplete lesion at T1 level of thoracic spinal cord, initial encounter: Secondary | ICD-10-CM

## 2015-07-17 DIAGNOSIS — Z7409 Other reduced mobility: Secondary | ICD-10-CM | POA: Diagnosis not present

## 2015-07-17 DIAGNOSIS — R531 Weakness: Secondary | ICD-10-CM

## 2015-07-17 DIAGNOSIS — R6889 Other general symptoms and signs: Secondary | ICD-10-CM

## 2015-07-17 DIAGNOSIS — G8220 Paraplegia, unspecified: Secondary | ICD-10-CM

## 2015-07-17 NOTE — Therapy (Signed)
[facility] [address] [place], [place], [address] Phone: [phone]   Fax:  [phone]  Physical Therapy Treatment  Patient Details  Name: FRANCIE KEELING MRN: [id number] Date of Birth: Apr 21, 1946 Referring Provider: Alger Simons, MD  Encounter Date: 07/17/2015      PT End of Session - 07/17/15 1051    Visit Number 9   Number of Visits 24   Date for PT Re-Evaluation 08/21/15   PT Start Time 1012   PT Stop Time 1058   PT Time Calculation (min) 46 min   Equipment Utilized During Treatment Gait belt   Activity Tolerance Patient tolerated treatment well   Behavior During Therapy [facility] for tasks assessed/performed      Past Medical History  Diagnosis Date  . Chronic kidney disease   . Hx: UTI (urinary tract infection)   . Right shoulder pain   . Right knee pain   . Hypertension     recently started Aldactone   . Hyperlipidemia     but not on meds;diet and exercise controlled  . Dizziness     has been going on for 2month;medical MD aware  . H/O hiatal hernia   . GERD (gastroesophageal reflux disease)     Tums prn  . Hemorrhoids   . Urinary frequency     d/t taking Aldactone  . History of UTI   . Anemia     hx of  . Cataract     immature-not sure of which eye  . Anxiety     r/t updated surgery  . Insomnia     takes Melatoniin daily  . Breast cancer ([facility]     right  . Skin cancer   . Hx of radiation therapy 10/25/12- 12/11/12    right chest wall/regional lymph nodes 5040 cGy, 28 sessions, right chest wall boost 1000 cGy 1 session  . H. pylori infection   . Metastasis to spinal column (HCC)     T5  . Complication of anesthesia     pt states she is sensitive to meds  . PONV (postoperative nausea and vomiting)     pt experienced hair loss, confusion and combative- 02/15/15    Past Surgical History  Procedure Laterality Date  . Cyst removed from left breast  1970  . Left wrist surgery   2004   with plate  . Colonoscopy    . Esophagogastroduodenoscopy    . Mastectomy, radical    . Mastectomy modified radical  05/10/2012    Procedure: MASTECTOMY MODIFIED RADICAL;  Surgeon: PMerrie Roof MD;  [facility]  Service: General;  Laterality: Right;  RIGHT MODIFIED RADICAL MASTECTOMY  . Portacath placement  05/10/2012    Procedure: INSERTION PORT-A-CATH;  Surgeon: PMerrie Roof MD;  [facility]: [facility]  Service: General;  Laterality: Left;  . Decompressive lumbar laminectomy level 4 N/A 02/15/2015    Procedure: DECOMPRESSION T5 AND T3-T7 STABALIZATION;  Surgeon: NConsuella Lose MD;  [facility];  Service: Neurosurgery;  Laterality: N/A;  . Laminectomy N/A 03/27/2015    Procedure: Thoracic Four-Thoracic Six Laminectomy for tumor;  Surgeon: NConsuella Lose MD;  [facility];  Service: Neurosurgery;  Laterality: N/A;  T4-T6 Laminectomy    There were no vitals filed for this visit.  Visit Diagnosis:  Incomplete spinal cord lesion at T1-T6 level without bone injury, initial encounter ([facility]  Decreased strength, endurance, and mobility  Paraplegia at T4 level (HCC)  Abnormality of  gait  Weakness of both lower extremities      Subjective Assessment - 07/17/15 1032    Subjective got ground reaction AFOs yesterday; had to get bigger shoes. not as sore today.   Patient Stated Goals improve mobility; walk   Currently in Pain? No/denies                         [facility] Adult PT Treatment/Exercise - 07/17/15 1034    Knee/Hip Exercises: Aerobic   Nustep L4 x 5 min; LEs only   Knee/Hip Exercises: Machines for Strengthening   Total Gym Leg Press 60# 2x10; 30# single limb x 10 bil       Amb with bil AFOs and RW 150' with one seated rest break with min A; improved stability and control with AFOs.  Min cues for equal step length and foot placement.           PT Short Term Goals - 06/29/15 1041    PT SHORT TERM GOAL #1   Title independent  with initial HEP (07/24/15)   Status New   PT SHORT TERM GOAL #2   Title ambulate > 89' with RW with min A for improved mobility and household ambulation    Status New   PT SHORT TERM GOAL #3   Title perform sit to/from stand transfers with supervision for improved independence    Status New   PT SHORT TERM GOAL #4   Title improve gait velocity to > 0.5 ft/sec for improved function and mobility   Status New           PT Long Term Goals - 06/29/15 1041    PT LONG TERM GOAL #1   Title ambulate > 100' with supervision with RW for improved mobility (08/21/15)   Status New   PT LONG TERM GOAL #2   Title improve gait velocity to > 1.0 ft/sec for improved function and mobility    Status New   PT LONG TERM GOAL #3   Title stand unsupported x 5 min with supervision for improved lower extremity strength and function    Status New   PT LONG TERM GOAL #4   Title perform stand pivot transfers with supervision for improved function and independence   Status New               Plan - 07/17/15 1052    Clinical Impression Statement Pt demonstrates improved gait and stability with ground reaction AFOs; concerned about width and fit in shoes.  Pt progressing well towards goals.   PT Next Visit Plan g code; standing in parallel bars, gait and standing tolerance; LE strengthening possibly using Leg press if appropriate per pt request; core/hip strengthening   Consulted and Agree with Plan of Care Patient   Family Member Consulted husband        Problem List Patient Active Problem List   Diagnosis Date Noted  . Anemia of chronic disease 05/21/2015  . Metastatic cancer to spine ([place]) 03/27/2015  . Thrombocytopenia ([place]) 03/12/2015  . Prerenal azotemia 03/12/2015  . Pain   . Recurrent UTI--due to Klebsiella   . Paraplegia at T4 level ([place]) 02/20/2015  . Neurogenic bowel 02/20/2015  . Neurogenic bladder 02/20/2015  . Postoperative anemia due to acute blood loss 02/17/2015  . Spinal  cord compression due to malignant neoplasm metastatic to spine ([place]) 02/15/2015  . Epidural mass 02/15/2015  . Thoracic spine tumor   . Pathologic fracture of  vertebra 02/14/2015  . Pathologic fracture of thoracic vertebrae 02/14/2015  . Breast cancer metastasized to bone ([place]) 02/14/2015  . Hyperlipidemia 02/14/2015  . H. pylori infection   . HTN (hypertension) 04/17/2012  . Primary cancer of upper outer quadrant of right female breast ([place]) 03/08/2012   Laureen Abrahams, PT, DPT 07/17/2015 10:59 AM  [facility] [address] [place], [place], [address] Phone: [phone]   Fax:  [phone]  Name: MALLY GAVINA MRN: [id number] Date of Birth: 13-Aug-1945

## 2015-07-20 ENCOUNTER — Ambulatory Visit: Payer: Medicare Other | Admitting: Physical Therapy

## 2015-07-20 DIAGNOSIS — R29898 Other symptoms and signs involving the musculoskeletal system: Secondary | ICD-10-CM | POA: Diagnosis not present

## 2015-07-20 DIAGNOSIS — Z7409 Other reduced mobility: Secondary | ICD-10-CM | POA: Diagnosis not present

## 2015-07-20 DIAGNOSIS — S24151A Other incomplete lesion at T1 level of thoracic spinal cord, initial encounter: Secondary | ICD-10-CM | POA: Diagnosis not present

## 2015-07-20 DIAGNOSIS — R6889 Other general symptoms and signs: Secondary | ICD-10-CM

## 2015-07-20 DIAGNOSIS — R52 Pain, unspecified: Secondary | ICD-10-CM | POA: Diagnosis not present

## 2015-07-20 DIAGNOSIS — R531 Weakness: Secondary | ICD-10-CM

## 2015-07-20 DIAGNOSIS — G8220 Paraplegia, unspecified: Secondary | ICD-10-CM

## 2015-07-20 DIAGNOSIS — M6289 Other specified disorders of muscle: Secondary | ICD-10-CM | POA: Diagnosis not present

## 2015-07-20 DIAGNOSIS — R269 Unspecified abnormalities of gait and mobility: Secondary | ICD-10-CM

## 2015-07-20 NOTE — Therapy (Signed)
[facility] [address] [place], [place], [address] Phone: [phone]   Fax:  [phone]  Physical Therapy Treatment  Patient Details  Name: Heather [name] MRN: [id number] Date of Birth: [birth year] Referring Provider: Alger Simons, MD  Encounter Date: 07/20/2015      PT End of Session - 07/20/15 1030    Visit Number 10   Number of Visits 24   Date for PT Re-Evaluation 08/21/15   PT Start Time 0930   PT Stop Time 1014   PT Time Calculation (min) 44 min   Equipment Utilized During Treatment Gait belt   Activity Tolerance Patient tolerated treatment well   Behavior During Therapy [facility] for tasks assessed/performed      Past Medical History  Diagnosis Date  . Chronic kidney disease   . Hx: UTI (urinary tract infection)   . Right shoulder pain   . Right knee pain   . Hypertension     recently started Aldactone   . Hyperlipidemia     but not on meds;diet and exercise controlled  . Dizziness     has been going on for 31month;medical MD aware  . H/O hiatal hernia   . GERD (gastroesophageal reflux disease)     Tums prn  . Hemorrhoids   . Urinary frequency     d/t taking Aldactone  . History of UTI   . Anemia     hx of  . Cataract     immature-not sure of which eye  . Anxiety     r/t updated surgery  . Insomnia     takes Melatoniin daily  . Breast cancer ([facility]     right  . Skin cancer   . Hx of radiation therapy 10/25/12- 12/11/12    right chest wall/regional lymph nodes 5040 cGy, 28 sessions, right chest wall boost 1000 cGy 1 session  . H. pylori infection   . Metastasis to spinal column (HCC)     T5  . Complication of anesthesia     pt states she is sensitive to meds  . PONV (postoperative nausea and vomiting)     pt experienced hair loss, confusion and combative- 02/15/15    Past Surgical History  Procedure Laterality Date  . Cyst removed from left breast  1970  . Left wrist surgery   2004   with plate  . Colonoscopy    . Esophagogastroduodenoscopy    . Mastectomy, radical    . Mastectomy modified radical  05/10/2012    Procedure: MASTECTOMY MODIFIED RADICAL;  Surgeon: PMerrie Roof MD;  [facility]  Service: General;  Laterality: Right;  RIGHT MODIFIED RADICAL MASTECTOMY  . Portacath placement  05/10/2012    Procedure: INSERTION PORT-A-CATH;  Surgeon: PMerrie Roof MD;  [facility]  Service: General;  Laterality: Left;  . Decompressive lumbar laminectomy level 4 N/A 02/15/2015    Procedure: DECOMPRESSION T5 AND T3-T7 STABALIZATION;  Surgeon: NConsuella Lose MD;  [facility];  Service: Neurosurgery;  Laterality: N/A;  . Laminectomy N/A 03/27/2015    Procedure: Thoracic Four-Thoracic Six Laminectomy for tumor;  Surgeon: NConsuella Lose MD;  [facility];  Service: Neurosurgery;  Laterality: N/A;  T4-T6 Laminectomy    There were no vitals filed for this visit.  Visit Diagnosis:  Incomplete spinal cord lesion at T1-T6 level without bone injury, initial encounter ([facility]  Decreased strength, endurance, and mobility  Paraplegia at T4 level (HCC)  Abnormality of  gait  Weakness of both lower extremities  Pain      Subjective Assessment - 03-Aug-2015 0928    Subjective having some breast pain yesterday; went to [employer] store on Friday.  wants to wait until braces adjusted before buying shoes.   Currently in Pain? No/denies                         [facility] Adult PT Treatment/Exercise - 03-Aug-2015 0950    Ambulation/Gait   Ambulation/Gait Yes   Ambulation/Gait Assistance 4: Min assist;4: Min guard   Ambulation/Gait Assistance Details with w/c follow; improved distance and speed today; min cues for step length but improved mobility today with bil AFOs; visual cues for step width and to decrease scissoring   Ambulation Distance (Feet) 240 Feet  120x2   Assistive device Rolling walker  AFOs   Gait Pattern Step-to  pattern;Decreased hip/knee flexion - right;Right genu recurvatum;Left genu recurvatum   Ambulation Surface Level;Indoor   Gait velocity 0.24 ft/sec  32.8 ft = 135.45 sec      Static standing at counter with min A and bil UE support alt step taps laterally to visual targets for proprioception and LE control.  Utilized visual cues and then trial without looking at target for proprioception and accuracy.  Pt with ~ 75% accuracy without visual cues.             PT Short Term Goals - 08/03/15 1032    PT SHORT TERM GOAL #1   Title independent with initial HEP (07/24/15)   Status On-going   PT SHORT TERM GOAL #2   Title ambulate > 67' with RW with min A for improved mobility and household ambulation    Status Achieved   PT SHORT TERM GOAL #3   Title perform sit to/from stand transfers with supervision for improved independence    Status Achieved   PT SHORT TERM GOAL #4   Title improve gait velocity to > 0.5 ft/sec for improved function and mobility   Status On-going           PT Long Term Goals - 06/29/15 1041    PT LONG TERM GOAL #1   Title ambulate > 100' with supervision with RW for improved mobility (08/21/15)   Status New   PT LONG TERM GOAL #2   Title improve gait velocity to > 1.0 ft/sec for improved function and mobility    Status New   PT LONG TERM GOAL #3   Title stand unsupported x 5 min with supervision for improved lower extremity strength and function    Status New   PT LONG TERM GOAL #4   Title perform stand pivot transfers with supervision for improved function and independence   Status New               Plan - 03-Aug-2015 1030    Clinical Impression Statement Pt with improved gait velocity nearly triple from initial evaluation (0.24 ft/sec from 0.07 ft/sec).  Pt progressing well with AFOs and improved step width with visual cues.  Pt has met 2 of 4 STGs.   PT Next Visit Plan assess remaining STGs; standing in parallel bars, gait and standing  tolerance; LE strengthening possibly using Leg press if appropriate per pt request; core/hip strengthening   Consulted and Agree with Plan of Care Patient   Family Member Consulted husband          G-Codes - 08-03-2015 07-21-1031  Functional Assessment Tool Used amb 120' with min A   Functional Limitation Mobility: Walking and moving around   Mobility: Walking and Moving Around Current Status [phone]) At least 60 percent but less than 80 percent impaired, limited or restricted   Mobility: Walking and Moving Around Goal Status [phone]) At least 40 percent but less than 60 percent impaired, limited or restricted      Problem List Patient Active Problem List   Diagnosis Date Noted  . Anemia of chronic disease 05/21/2015  . Metastatic cancer to spine ([place]) 03/27/2015  . Thrombocytopenia ([place]) 03/12/2015  . Prerenal azotemia 03/12/2015  . Pain   . Recurrent UTI--due to Klebsiella   . Paraplegia at T4 level ([place]) 02/20/2015  . Neurogenic bowel 02/20/2015  . Neurogenic bladder 02/20/2015  . Postoperative anemia due to acute blood loss 02/17/2015  . Spinal cord compression due to malignant neoplasm metastatic to spine ([place]) 02/15/2015  . Epidural mass 02/15/2015  . Thoracic spine tumor   . Pathologic fracture of vertebra 02/14/2015  . Pathologic fracture of thoracic vertebrae 02/14/2015  . Breast cancer metastasized to bone ([place]) 02/14/2015  . Hyperlipidemia 02/14/2015  . H. pylori infection   . HTN (hypertension) 04/17/2012  . Primary cancer of upper outer quadrant of right female breast ([place]) 03/08/2012   Laureen Abrahams, PT, DPT 07/20/2015 10:34 AM  [facility] [address] [place], [place], [address] Phone: [phone]   Fax:  [phone]  Name: Heather [name] MRN: [id number] Date of Birth: 1945/06/26

## 2015-07-22 ENCOUNTER — Ambulatory Visit: Payer: Medicare Other | Admitting: Physical Therapy

## 2015-07-22 DIAGNOSIS — R269 Unspecified abnormalities of gait and mobility: Secondary | ICD-10-CM

## 2015-07-22 DIAGNOSIS — Z7409 Other reduced mobility: Secondary | ICD-10-CM

## 2015-07-22 DIAGNOSIS — S24151A Other incomplete lesion at T1 level of thoracic spinal cord, initial encounter: Secondary | ICD-10-CM | POA: Diagnosis not present

## 2015-07-22 DIAGNOSIS — R531 Weakness: Secondary | ICD-10-CM

## 2015-07-22 DIAGNOSIS — M6289 Other specified disorders of muscle: Secondary | ICD-10-CM | POA: Diagnosis not present

## 2015-07-22 DIAGNOSIS — R29898 Other symptoms and signs involving the musculoskeletal system: Secondary | ICD-10-CM

## 2015-07-22 DIAGNOSIS — R6889 Other general symptoms and signs: Secondary | ICD-10-CM

## 2015-07-22 DIAGNOSIS — G8220 Paraplegia, unspecified: Secondary | ICD-10-CM

## 2015-07-22 DIAGNOSIS — R52 Pain, unspecified: Secondary | ICD-10-CM | POA: Diagnosis not present

## 2015-07-22 DIAGNOSIS — C50911 Malignant neoplasm of unspecified site of right female breast: Secondary | ICD-10-CM | POA: Diagnosis not present

## 2015-07-22 NOTE — Therapy (Signed)
[facility] [address] [place], [place], [address] Phone: [phone]   Fax:  [phone]  Physical Therapy Treatment  Patient Details  Name: Heather [name] MRN: [id number] Date of Birth: [birth year] Referring Provider: Alger Simons, MD  Encounter Date: 07/22/2015      PT End of Session - 07/22/15 1355    Visit Number 11   Number of Visits 24   Date for PT Re-Evaluation 08/21/15   PT Start Time 1315   PT Stop Time 1356   PT Time Calculation (min) 41 min   Equipment Utilized During Treatment Gait belt   Activity Tolerance Patient tolerated treatment well   Behavior During Therapy [facility] for tasks assessed/performed      Past Medical History  Diagnosis Date  . Chronic kidney disease   . Hx: UTI (urinary tract infection)   . Right shoulder pain   . Right knee pain   . Hypertension     recently started Aldactone   . Hyperlipidemia     but not on meds;diet and exercise controlled  . Dizziness     has been going on for 11month;medical MD aware  . H/O hiatal hernia   . GERD (gastroesophageal reflux disease)     Tums prn  . Hemorrhoids   . Urinary frequency     d/t taking Aldactone  . History of UTI   . Anemia     hx of  . Cataract     immature-not sure of which eye  . Anxiety     r/t updated surgery  . Insomnia     takes Melatoniin daily  . Breast cancer ([facility]     right  . Skin cancer   . Hx of radiation therapy 10/25/12- 12/11/12    right chest wall/regional lymph nodes 5040 cGy, 28 sessions, right chest wall boost 1000 cGy 1 session  . H. pylori infection   . Metastasis to spinal column (HCC)     T5  . Complication of anesthesia     pt states she is sensitive to meds  . PONV (postoperative nausea and vomiting)     pt experienced hair loss, confusion and combative- 02/15/15    Past Surgical History  Procedure Laterality Date  . Cyst removed from left breast  1970  . Left wrist surgery   2004   with plate  . Colonoscopy    . Esophagogastroduodenoscopy    . Mastectomy, radical    . Mastectomy modified radical  05/10/2012    Procedure: MASTECTOMY MODIFIED RADICAL;  Surgeon: PMerrie Roof MD;  [facility]: [facility]  Service: General;  Laterality: Right;  RIGHT MODIFIED RADICAL MASTECTOMY  . Portacath placement  05/10/2012    Procedure: INSERTION PORT-A-CATH;  Surgeon: PMerrie Roof MD;  [facility]  Service: General;  Laterality: Left;  . Decompressive lumbar laminectomy level 4 N/A 02/15/2015    Procedure: DECOMPRESSION T5 AND T3-T7 STABALIZATION;  Surgeon: NConsuella Lose MD;  [facility];  Service: Neurosurgery;  Laterality: N/A;  . Laminectomy N/A 03/27/2015    Procedure: Thoracic Four-Thoracic Six Laminectomy for tumor;  Surgeon: NConsuella Lose MD;  [facility];  Service: Neurosurgery;  Laterality: N/A;  T4-T6 Laminectomy    There were no vitals filed for this visit.  Visit Diagnosis:  Incomplete spinal cord lesion at T1-T6 level without bone injury, initial encounter ([facility]  Decreased strength, endurance, and mobility  Paraplegia at T4 level (HCC)  Abnormality of  gait  Weakness of both lower extremities  Pain      Subjective Assessment - 07/22/15 1317    Subjective Just left orthotist and feels the orthotics have been adjusted to fit a little better.   Patient Stated Goals improve mobility; walk   Currently in Pain? No/denies         Amb 110' x 2 with RW with minguard A and occasional min A due to too large step length.  Min cues for step length and heel strike.  Simulated transfer from w/c to elevated exam table as pt needs to step up at MD office.  Performed with min A and +2 for safety.  Performed x 4 with cues for technique.  NuStep Level 6 x 5 min lower extremities only                         PT Short Term Goals - 07/20/15 1032    PT SHORT TERM GOAL #1   Title independent with initial HEP (07/24/15)    Status On-going   PT SHORT TERM GOAL #2   Title ambulate > 68' with RW with min A for improved mobility and household ambulation    Status Achieved   PT SHORT TERM GOAL #3   Title perform sit to/from stand transfers with supervision for improved independence    Status Achieved   PT SHORT TERM GOAL #4   Title improve gait velocity to > 0.5 ft/sec for improved function and mobility   Status On-going           PT Long Term Goals - 06/29/15 1041    PT LONG TERM GOAL #1   Title ambulate > 100' with supervision with RW for improved mobility (08/21/15)   Status New   PT LONG TERM GOAL #2   Title improve gait velocity to > 1.0 ft/sec for improved function and mobility    Status New   PT LONG TERM GOAL #3   Title stand unsupported x 5 min with supervision for improved lower extremity strength and function    Status New   PT LONG TERM GOAL #4   Title perform stand pivot transfers with supervision for improved function and independence   Status New               Plan - 07/22/15 1356    Clinical Impression Statement Pt progressing well needing minguard A only with occasional min A with ambulation due to large step length.  Progressing well towards goals.   PT Next Visit Plan assess remaining STGs; standing in parallel bars, gait and standing tolerance; LE strengthening possibly using Leg press if appropriate per pt request; core/hip strengthening   Consulted and Agree with Plan of Care Patient        Problem List Patient Active Problem List   Diagnosis Date Noted  . Anemia of chronic disease 05/21/2015  . Metastatic cancer to spine ([place]) 03/27/2015  . Thrombocytopenia ([place]) 03/12/2015  . Prerenal azotemia 03/12/2015  . Pain   . Recurrent UTI--due to Klebsiella   . Paraplegia at T4 level ([place]) 02/20/2015  . Neurogenic bowel 02/20/2015  . Neurogenic bladder 02/20/2015  . Postoperative anemia due to acute blood loss 02/17/2015  . Spinal cord compression due to malignant  neoplasm metastatic to spine ([place]) 02/15/2015  . Epidural mass 02/15/2015  . Thoracic spine tumor   . Pathologic fracture of vertebra 02/14/2015  . Pathologic fracture of thoracic vertebrae 02/14/2015  .  Breast cancer metastasized to bone ([place]) 02/14/2015  . Hyperlipidemia 02/14/2015  . H. pylori infection   . HTN (hypertension) 04/17/2012  . Primary cancer of upper outer quadrant of right female breast ([place]) 03/08/2012   Laureen Abrahams, PT, DPT 07/22/2015 1:58 PM  [facility] [address] [place], [place], [address] Phone: [phone]   Fax:  [phone]  Name: Heather [name] MRN: [id number] Date of Birth: 15-May-1945

## 2015-07-23 ENCOUNTER — Telehealth: Payer: Self-pay | Admitting: Hematology and Oncology

## 2015-07-23 ENCOUNTER — Ambulatory Visit (HOSPITAL_BASED_OUTPATIENT_CLINIC_OR_DEPARTMENT_OTHER): Payer: Medicare Other | Admitting: Hematology and Oncology

## 2015-07-23 ENCOUNTER — Ambulatory Visit (HOSPITAL_BASED_OUTPATIENT_CLINIC_OR_DEPARTMENT_OTHER): Payer: Medicare Other

## 2015-07-23 ENCOUNTER — Other Ambulatory Visit: Payer: Medicare Other

## 2015-07-23 ENCOUNTER — Other Ambulatory Visit (HOSPITAL_BASED_OUTPATIENT_CLINIC_OR_DEPARTMENT_OTHER): Payer: Medicare Other

## 2015-07-23 ENCOUNTER — Encounter: Payer: Self-pay | Admitting: Hematology and Oncology

## 2015-07-23 VITALS — BP 139/82 | HR 93 | Temp 97.8°F | Resp 18 | Ht 64.0 in | Wt 132.1 lb

## 2015-07-23 DIAGNOSIS — C7951 Secondary malignant neoplasm of bone: Secondary | ICD-10-CM | POA: Diagnosis not present

## 2015-07-23 DIAGNOSIS — D638 Anemia in other chronic diseases classified elsewhere: Secondary | ICD-10-CM

## 2015-07-23 DIAGNOSIS — Z170 Estrogen receptor positive status [ER+]: Secondary | ICD-10-CM

## 2015-07-23 DIAGNOSIS — R5383 Other fatigue: Secondary | ICD-10-CM

## 2015-07-23 DIAGNOSIS — C50911 Malignant neoplasm of unspecified site of right female breast: Secondary | ICD-10-CM

## 2015-07-23 DIAGNOSIS — C50411 Malignant neoplasm of upper-outer quadrant of right female breast: Secondary | ICD-10-CM

## 2015-07-23 DIAGNOSIS — I10 Essential (primary) hypertension: Secondary | ICD-10-CM

## 2015-07-23 DIAGNOSIS — Z5112 Encounter for antineoplastic immunotherapy: Secondary | ICD-10-CM | POA: Diagnosis present

## 2015-07-23 DIAGNOSIS — Z452 Encounter for adjustment and management of vascular access device: Secondary | ICD-10-CM

## 2015-07-23 DIAGNOSIS — Z95828 Presence of other vascular implants and grafts: Secondary | ICD-10-CM

## 2015-07-23 DIAGNOSIS — C773 Secondary and unspecified malignant neoplasm of axilla and upper limb lymph nodes: Secondary | ICD-10-CM

## 2015-07-23 LAB — COMPREHENSIVE METABOLIC PANEL
ALT: 18 U/L (ref 0–55)
AST: 23 U/L (ref 5–34)
Albumin: 3.6 g/dL (ref 3.5–5.0)
Alkaline Phosphatase: 69 U/L (ref 40–150)
Anion Gap: 8 mEq/L (ref 3–11)
BUN: 15.2 mg/dL (ref 7.0–26.0)
CO2: 29 mEq/L (ref 22–29)
Calcium: 9.5 mg/dL (ref 8.4–10.4)
Chloride: 103 mEq/L (ref 98–109)
Creatinine: 0.8 mg/dL (ref 0.6–1.1)
EGFR: 74 mL/min/{1.73_m2} — ABNORMAL LOW (ref >90)
Glucose: 175 mg/dl — ABNORMAL HIGH (ref 70–140)
Potassium: 3.8 mEq/L (ref 3.5–5.1)
Sodium: 140 mEq/L (ref 136–145)
Total Bilirubin: 0.35 mg/dL (ref 0.20–1.20)
Total Protein: 7 g/dL (ref 6.4–8.3)

## 2015-07-23 LAB — TSH: TSH: 0.917 m(IU)/L (ref 0.308–3.960)

## 2015-07-23 LAB — CBC WITH DIFFERENTIAL/PLATELET
BASO%: 0.3 % (ref 0.0–2.0)
Basophils Absolute: 0 10*3/uL (ref 0.0–0.1)
EOS%: 0.8 % (ref 0.0–7.0)
Eosinophils Absolute: 0 10*3/uL (ref 0.0–0.5)
HCT: 36.8 % (ref 34.8–46.6)
HGB: 11.5 g/dL — ABNORMAL LOW (ref 11.6–15.9)
LYMPH%: 17.4 % (ref 14.0–49.7)
MCH: 25.2 pg (ref 25.1–34.0)
MCHC: 31.3 g/dL — ABNORMAL LOW (ref 31.5–36.0)
MCV: 80.5 fL (ref 79.5–101.0)
MONO#: 0.3 10*3/uL (ref 0.1–0.9)
MONO%: 6.9 % (ref 0.0–14.0)
NEUT#: 2.7 10*3/uL (ref 1.5–6.5)
NEUT%: 74.6 % (ref 38.4–76.8)
Platelets: 185 10*3/uL (ref 145–400)
RBC: 4.57 10*6/uL (ref 3.70–5.45)
RDW: 16.6 % — ABNORMAL HIGH (ref 11.2–14.5)
WBC: 3.6 10*3/uL — ABNORMAL LOW (ref 3.9–10.3)
lymph#: 0.6 10*3/uL — ABNORMAL LOW (ref 0.9–3.3)

## 2015-07-23 MED ORDER — SODIUM CHLORIDE 0.9% FLUSH
10.0000 mL | INTRAVENOUS | Status: DC | PRN
  Administered 2015-07-23: 10 mL via INTRAVENOUS
  Filled 2015-07-23: qty 10

## 2015-07-23 MED ORDER — SODIUM CHLORIDE 0.9 % IV SOLN
Freq: Once | INTRAVENOUS | Status: AC
  Administered 2015-07-23: 15:00:00 via INTRAVENOUS

## 2015-07-23 MED ORDER — DIPHENHYDRAMINE HCL 25 MG PO CAPS
25.0000 mg | ORAL_CAPSULE | Freq: Once | ORAL | Status: AC
  Administered 2015-07-23: 25 mg via ORAL

## 2015-07-23 MED ORDER — HEPARIN SOD (PORK) LOCK FLUSH 100 UNIT/ML IV SOLN
500.0000 [IU] | Freq: Once | INTRAVENOUS | Status: DC | PRN
  Filled 2015-07-23: qty 5

## 2015-07-23 MED ORDER — DIPHENHYDRAMINE HCL 25 MG PO CAPS
ORAL_CAPSULE | ORAL | Status: AC
  Filled 2015-07-23: qty 1

## 2015-07-23 MED ORDER — ACETAMINOPHEN 325 MG PO TABS
650.0000 mg | ORAL_TABLET | Freq: Once | ORAL | Status: AC
  Administered 2015-07-23: 650 mg via ORAL

## 2015-07-23 MED ORDER — TRASTUZUMAB CHEMO INJECTION 440 MG
6.0000 mg/kg | Freq: Once | INTRAVENOUS | Status: AC
  Administered 2015-07-23: 399 mg via INTRAVENOUS
  Filled 2015-07-23: qty 19

## 2015-07-23 MED ORDER — SODIUM CHLORIDE 0.9 % IJ SOLN
10.0000 mL | INTRAMUSCULAR | Status: DC | PRN
  Filled 2015-07-23: qty 10

## 2015-07-23 MED ORDER — EXEMESTANE 25 MG PO TABS: 25.0000 mg | ORAL_TABLET | Freq: Every day | ORAL | Status: DC

## 2015-07-23 MED ORDER — ACETAMINOPHEN 325 MG PO TABS
ORAL_TABLET | ORAL | Status: AC
  Filled 2015-07-23: qty 2

## 2015-07-23 NOTE — Patient Instructions (Signed)

## 2015-07-23 NOTE — Progress Notes (Signed)
Patient Care Team: [name], MD as PCP - General (Family Medicine)  DIAGNOSIS: Primary cancer of upper outer quadrant of right female breast [facility])   Staging form: Breast, AJCC 7th Edition     Clinical: Stage IIB (T2, N1, cM0) - Unsigned       Staging comments: Staged at breast conference 11.6.13      Pathologic: No stage assigned - Unsigned   SUMMARY OF ONCOLOGIC HISTORY:   Primary cancer of upper outer quadrant of right female breast ([place])   03/08/2012 Initial Diagnosis Right breast invasive ductal carcinoma ER positive PR negative HER-2 positive Ki-67 44%; another breast mass biopsied in the anterior part of the breast which was also positive for malignancy that was HER-2 negative   05/10/2012 Surgery Right mastectomy and axillary lymph node dissection: Multifocal disease 5 cm, 1.7 cm, 1.6 cm, ER positive PR negative HER-2 positive Ki-67 44%, 3/7 lymph nodes positive   06/14/2012 - 06/12/2013 Chemotherapy Adjuvant chemotherapy with [place] 6 followed by Herceptin maintenance   10/25/2012 - 12/11/2012 Radiation Therapy Adjuvant radiation therapy   02/07/2013 -  Anti-estrogen oral therapy Letrozole 2.5 mg daily   02/14/2015 Imaging MRI spine: Large destructive T5 lesion with severe pathologic compression fracture and extensive epidural tumor, severe spinal stenosis with moderate cord compression, tumor involvement of T4   03/18/2015 PET scan Residual enhancing soft tissue adjacent to the spinal cord. No evidence of metastatic disease. Nonspecific uptake the left nipple   03/27/2015 - 03/30/2015 Hospital Admission T4-6 decompression for spinal cord compression (lower extremity paralysis)   04/10/2015 -  Chemotherapy Palliative treatment with Herceptin every 3 weeks   04/13/2015 - 05/19/2015 Radiation Therapy Palliative radiation treatment to the spine    CHIEF COMPLIANT: Complaining of profound hair loss  INTERVAL HISTORY: Heather [name] is a 70 year old with above-mentioned history of metastatic  breast cancer with solitary spine metastases for which she received palliative radiation and is currently on Herceptin with anastrozole. She is complaining of profound hair loss. I believe this may be related to anastrozole. She is also continuing to have back pain issues. Her leg strength is significantly improving with physical therapy. She is super excited to be able to see her grandchildren.  REVIEW OF SYSTEMS:   Constitutional: Denies fevers, chills or abnormal weight loss Eyes: Denies blurriness of vision Ears, nose, mouth, throat, and face: Denies mucositis or sore throat Respiratory: Denies cough, dyspnea or wheezes Cardiovascular: Denies palpitation, chest discomfort Gastrointestinal:  Denies nausea, heartburn or change in bowel habits Skin: Denies abnormal skin rashes Lymphatics: Denies new lymphadenopathy or easy bruising Neurological: Lower extremity weakness Behavioral/Psych: Mood is stable, no new changes  Extremities: No lower extremity edema Breast:  denies any pain or lumps or nodules in either breasts All other systems were reviewed with the patient and are negative.  I have reviewed the past medical history, past surgical history, social history and family history with the patient and they are unchanged from previous note.  ALLERGIES:  is allergic to codeine; keflex; naprosyn; oxycodone; and tussin.  MEDICATIONS:  Current Outpatient Prescriptions  Medication Sig Dispense Refill  . anastrozole (ARIMIDEX) 1 MG tablet Take 1 tablet by mouth daily.    Marland Kitchen apixaban (ELIQUIS) 2.5 MG TABS tablet Take 1 tablet (2.5 mg total) by mouth 2 (two) times daily. 180 tablet 0  . b complex vitamins tablet Take 1 tablet by mouth daily.    . Calcium Carbonate Antacid (TUMS PO) Take 2 tablets by mouth 2 (two) times daily  as needed (acid reflux).    . Calcium Carbonate-Vitamin D (CALCIUM-D PO) Take 1 tablet by mouth daily. Reported on 06/25/2015    . Cholecalciferol (VITAMIN D) 2000 UNITS  tablet Take 2,000 Units by mouth daily.    . Coenzyme Q10 (COQ10) 100 MG CAPS Take 100 mg by mouth at bedtime.    Marland Kitchen exemestane (AROMASIN) 25 MG tablet Take 1 tablet (25 mg total) by mouth daily after breakfast. 90 tablet 3  . gabapentin (NEURONTIN) 600 MG tablet Take 1 tablet (600 mg total) by mouth 3 (three) times daily. 90 tablet 3  . lidocaine-prilocaine (EMLA) cream Apply to affected area once (Patient taking differently: Apply 1 application topically every 21 ( twenty-one) days. Apply to port prior to infusions) 30 g 3  . methocarbamol (ROBAXIN) 500 MG tablet Take 1 tablet (500 mg total) by mouth daily as needed for muscle spasms. 75 tablet 0  . OVER THE COUNTER MEDICATION Place 1 drop into both eyes 2 (two) times daily as needed (dry eyes). Over the counter lubricating eye drops    . polyethylene glycol (MIRALAX / GLYCOLAX) packet Take 17 g by mouth 2 (two) times daily. (Patient taking differently: Take 8.5 g by mouth daily as needed (constipation). ) 100 each 0  . pravastatin (PRAVACHOL) 10 MG tablet Take 10 mg by mouth at bedtime.     . Probiotic Product (PROBIOTIC DAILY PO) Take 1 capsule by mouth 2 (two) times daily.     . prochlorperazine (COMPAZINE) 10 MG tablet Take 1 tablet (10 mg total) by mouth every 6 (six) hours as needed for nausea or vomiting. 30 tablet 0  . ranitidine (ZANTAC) 300 MG tablet Take 300 mg by mouth at bedtime. Reported on 06/25/2015    . spironolactone (ALDACTONE) 25 MG tablet Take 0.5 tablets (12.5 mg total) by mouth daily. 45 tablet 12  . sucralfate (CARAFATE) 1 G tablet Take 1 tablet (1 g total) by mouth 4 (four) times daily. 120 tablet 2  . traMADol (ULTRAM) 50 MG tablet Take 1 tablet (50 mg total) by mouth every 6 (six) hours as needed for moderate pain. 60 tablet 0  . Wound Cleansers (RADIAPLEX EX) Apply topically. Reported on 06/25/2015     No current facility-administered medications for this visit.   Facility-Administered Medications Ordered in Other Visits   Medication Dose Route Frequency Provider Last Rate Last Dose  . heparin lock flush 100 unit/mL  500 Units Intracatheter Once PRN [name], MD      . sodium chloride 0.9 % injection 10 mL  10 mL Intracatheter PRN [name], MD        PHYSICAL EXAMINATION: ECOG PERFORMANCE STATUS: 1 - Symptomatic but completely ambulatory  Filed Vitals:   07/23/15 1434  BP: 139/82  Pulse: 93  Temp: 97.8 F (36.6 C)  Resp: 18   Filed Weights   07/23/15 1434  Weight: 132 lb 1.6 oz (59.92 kg)    GENERAL:alert, no distress and comfortable SKIN: skin color, texture, turgor are normal, no rashes or significant lesions EYES: normal, Conjunctiva are pink and non-injected, sclera clear OROPHARYNX:no exudate, no erythema and lips, buccal mucosa, and tongue normal  NECK: supple, thyroid normal size, non-tender, without nodularity LYMPH:  no palpable lymphadenopathy in the cervical, axillary or inguinal LUNGS: clear to auscultation and percussion with normal breathing effort HEART: regular rate & rhythm and no murmurs and no lower extremity edema ABDOMEN:abdomen soft, non-tender and normal bowel sounds MUSCULOSKELETAL:no cyanosis of digits and no clubbing  NEURO: alert & oriented x 3 with fluent speech, lower extremity sensory deficit as well as weakness EXTREMITIES: No lower extremity edema  LABORATORY DATA:  I have reviewed the data as listed   Chemistry      Component Value Date/Time   NA 140 07/23/2015 1336   NA 135 03/28/2015 0446   K 3.8 07/23/2015 1336   K 3.8 03/28/2015 0446   CL 99* 03/28/2015 0446   CL 101 10/25/2012 0946   CO2 29 07/23/2015 1336   CO2 28 03/28/2015 0446   BUN 15.2 07/23/2015 1336   BUN 24* 03/28/2015 0446   CREATININE 0.8 07/23/2015 1336   CREATININE 0.78 03/28/2015 0446      Component Value Date/Time   CALCIUM 9.5 07/23/2015 1336   CALCIUM 7.8* 03/28/2015 0446   ALKPHOS 69 07/23/2015 1336   ALKPHOS 124 03/27/2015 1434   AST 23 07/23/2015 1336   AST 42*  03/27/2015 1434   ALT 18 07/23/2015 1336   ALT 136* 03/27/2015 1434   BILITOT 0.35 07/23/2015 1336   BILITOT 0.9 03/27/2015 1434       Lab Results  Component Value Date   WBC 3.6* 07/23/2015   HGB 11.5* 07/23/2015   HCT 36.8 07/23/2015   MCV 80.5 07/23/2015   PLT 185 07/23/2015   NEUTROABS 2.7 07/23/2015     ASSESSMENT & PLAN:  Primary cancer of upper outer quadrant of right female breast ([place]) Right breast invasive ductal carcinoma ER positive PR negative HER-2 positive Ki-67 44% multifocal disease 3/7 lymph nodes positive T2 N1 M0 stage IIB status post adjuvant chemotherapy with TCH followed by Herceptin maintenance, adjuvant radiation therapy, currently on letrozole since 02/07/2013. MRI 02/14/2015: T5 large destructive lesion with pathologic compression fracture and extensive epidural tumor, involvement of T4, excision metastatic carcinoma ER 60%, PR 0%, HER-2 positive ratio 2.71 average copy #7.45 PET CT 03/18/2015:Residual enhancing soft tissue adjacent to the spinal cord. No evidence of metastatic disease. Nonspecific uptake the left nipple. 03/27/2015: T4-6 decompression surgery for spinal cord compression Radiation therapy to the spine (Dr. [name]) started 12/05/2016completed 05/19/2015  Recommendation: 1. Resumed anastrozole 05/21/2015, but it has caused significant hair loss so we will switch her to exemestane 25 mg once daily 2. Herceptin every 3 week started 04/10/2015 3. Obtain echocardiograms every 3 months 4. Bone metastases: Zometa every 3 months. (Change made because of diffuse bone pain related to Zometa infusions)  Zometa toxicities: bone pain and fatigue. If it causes persistent symptoms, we might switch her Zometa every 3 months.  Hypoproteinemia: patient trying to increase her dietary protein And her albumin level significant increase of 3.4  Anemia of chronic disease:Hemoglobin is slowly improving. Today's hemoglobin is  Goal of treatment:  Palliation Echocardiogram 07/13/2015: EF 55-60%  I recommend obtaining a CT scan for restaging which will be done in 6 weeks prior to her Herceptin.   Orders Placed This Encounter  Procedures  . CT Abdomen Pelvis W Contrast    Standing Status: Future     Number of Occurrences:      Standing Expiration Date: 07/22/2016    Order Specific Question:  If indicated for the ordered procedure, I authorize the administration of contrast media per Radiology protocol    Answer:  Yes    Order Specific Question:  Reason for Exam (SYMPTOM  OR DIAGNOSIS REQUIRED)    Answer:  Met Breast cancer restaging    Order Specific Question:  Preferred imaging location?    Answer:  [facility]  .  CT Chest W Contrast    Standing Status: Future     Number of Occurrences:      Standing Expiration Date: 07/22/2016    Order Specific Question:  If indicated for the ordered procedure, I authorize the administration of contrast media per Radiology protocol    Answer:  Yes    Order Specific Question:  Reason for Exam (SYMPTOM  OR DIAGNOSIS REQUIRED)    Answer:  Met Breast cancer restaging    Order Specific Question:  Preferred imaging location?    Answer:  [facility]  . TSH    Standing Status: Future     Number of Occurrences: 1     Standing Expiration Date: 07/22/2016   The patient has a good understanding of the overall plan. she agrees with it. she will call with any problems that may develop before the next visit here.   Rulon Eisenmenger, MD 07/23/2015

## 2015-07-23 NOTE — Telephone Encounter (Signed)
appt made and avs printed °

## 2015-07-23 NOTE — Patient Instructions (Signed)
Trastuzumab injection for infusion What is this medicine? TRASTUZUMAB (tras TOO zoo mab) is a monoclonal antibody. It is used to treat breast cancer and stomach cancer. This medicine may be used for other purposes; ask your health care provider or pharmacist if you have questions. What should I tell my health care provider before I take this medicine? They need to know if you have any of these conditions: -heart disease -heart failure -infection (especially a virus infection such as chickenpox, cold sores, or herpes) -lung or breathing disease, like asthma -recent or ongoing radiation therapy -an unusual or allergic reaction to trastuzumab, benzyl alcohol, or other medications, foods, dyes, or preservatives -pregnant or trying to get pregnant -breast-feeding How should I use this medicine? This drug is given as an infusion into a vein. It is administered in a hospital or clinic by a specially trained health care professional. Talk to your pediatrician regarding the use of this medicine in children. This medicine is not approved for use in children. Overdosage: If you think you have taken too much of this medicine contact a poison control center or emergency room at once. NOTE: This medicine is only for you. Do not share this medicine with others. What if I miss a dose? It is important not to miss a dose. Call your doctor or health care professional if you are unable to keep an appointment. What may interact with this medicine? -doxorubicin -warfarin This list may not describe all possible interactions. Give your health care provider a list of all the medicines, herbs, non-prescription drugs, or dietary supplements you use. Also tell them if you smoke, drink alcohol, or use illegal drugs. Some items may interact with your medicine. What should I watch for while using this medicine? Visit your doctor for checks on your progress. Report any side effects. Continue your course of treatment even  though you feel ill unless your doctor tells you to stop. Call your doctor or health care professional for advice if you get a fever, chills or sore throat, or other symptoms of a cold or flu. Do not treat yourself. Try to avoid being around people who are sick. You may experience fever, chills and shaking during your first infusion. These effects are usually mild and can be treated with other medicines. Report any side effects during the infusion to your health care professional. Fever and chills usually do not happen with later infusions. Do not become pregnant while taking this medicine or for 7 months after stopping it. Women should inform their doctor if they wish to become pregnant or think they might be pregnant. Women of child-bearing potential will need to have a negative pregnancy test before starting this medicine. There is a potential for serious side effects to an unborn child. Talk to your health care professional or pharmacist for more information. Do not breast-feed an infant while taking this medicine or for 7 months after stopping it. Women must use effective birth control with this medicine. What side effects may I notice from receiving this medicine? Side effects that you should report to your doctor or other health care professional as soon as possible: -breathing difficulties -chest pain or palpitations -cough -dizziness or fainting -fever or chills, sore throat -skin rash, itching or hives -swelling of the legs or ankles -unusually weak or tired Side effects that usually do not require medical attention (report to your doctor or other health care professional if they continue or are bothersome): -loss of appetite -headache -muscle aches -nausea This  list may not describe all possible side effects. Call your doctor for medical advice about side effects. You may report side effects to FDA at 1-800-FDA-1088. Where should I keep my medicine? This drug is given in a hospital  or clinic and will not be stored at home. NOTE: This sheet is a summary. It may not cover all possible information. If you have questions about this medicine, talk to your doctor, pharmacist, or health care provider.    2016, Elsevier/Gold Standard. (2014-08-01 11:49:32)

## 2015-07-23 NOTE — Assessment & Plan Note (Signed)
Right breast invasive ductal carcinoma ER positive PR negative HER-2 positive Ki-67 44% multifocal disease 3/7 lymph nodes positive T2 N1 M0 stage IIB status post adjuvant chemotherapy with TCH followed by Herceptin maintenance, adjuvant radiation therapy, currently on letrozole since 02/07/2013. MRI 02/14/2015: T5 large destructive lesion with pathologic compression fracture and extensive epidural tumor, involvement of T4, excision metastatic carcinoma ER 60%, PR 0%, HER-2 positive ratio 2.71 average copy #7.45 PET CT 03/18/2015:Residual enhancing soft tissue adjacent to the spinal cord. No evidence of metastatic disease. Nonspecific uptake the left nipple. 03/27/2015: T4-6 decompression surgery for spinal cord compression Radiation therapy to the spine (Dr. [name]) started 12/05/2016completed 05/19/2015  Recommendation: 1. Resumed anastrozole 05/21/2015 2. Herceptin every 3 week started 04/10/2015 3. Obtain echocardiograms every 3 months 4. Bone metastases: Patient has been receiving Zometa every 3 weeks with calcium and vitamin D: Patient gets bone pain related to this. If she continues to have these symptoms I might change her treatment to every 3 months. Zometa toxicities: bone pain and fatigue. If it causes persistent symptoms, we might switch her Zometa every 3 months.  Hypoproteinemia: patient trying to increase her dietary protein And her albumin level significant increase of 3.4  Anemia of chronic disease:Hemoglobin is slowly improving. Today's hemoglobin is  Goal of treatment: Palliation Echocardiogram 07/13/2015: EF 55-60%  I recommend obtaining a PET CT scan for restaging.

## 2015-07-24 ENCOUNTER — Other Ambulatory Visit: Payer: Self-pay

## 2015-07-24 ENCOUNTER — Ambulatory Visit: Payer: Medicare Other | Admitting: Occupational Therapy

## 2015-07-24 ENCOUNTER — Encounter: Payer: Self-pay | Admitting: Occupational Therapy

## 2015-07-24 ENCOUNTER — Ambulatory Visit: Payer: Medicare Other | Admitting: Physical Therapy

## 2015-07-24 DIAGNOSIS — I10 Essential (primary) hypertension: Secondary | ICD-10-CM

## 2015-07-24 DIAGNOSIS — Z7409 Other reduced mobility: Secondary | ICD-10-CM | POA: Diagnosis not present

## 2015-07-24 DIAGNOSIS — S24151A Other incomplete lesion at T1 level of thoracic spinal cord, initial encounter: Secondary | ICD-10-CM

## 2015-07-24 DIAGNOSIS — R269 Unspecified abnormalities of gait and mobility: Secondary | ICD-10-CM

## 2015-07-24 DIAGNOSIS — R531 Weakness: Secondary | ICD-10-CM

## 2015-07-24 DIAGNOSIS — R52 Pain, unspecified: Secondary | ICD-10-CM | POA: Diagnosis not present

## 2015-07-24 DIAGNOSIS — M6289 Other specified disorders of muscle: Secondary | ICD-10-CM | POA: Diagnosis not present

## 2015-07-24 DIAGNOSIS — D638 Anemia in other chronic diseases classified elsewhere: Secondary | ICD-10-CM

## 2015-07-24 DIAGNOSIS — R29898 Other symptoms and signs involving the musculoskeletal system: Secondary | ICD-10-CM

## 2015-07-24 DIAGNOSIS — C50411 Malignant neoplasm of upper-outer quadrant of right female breast: Secondary | ICD-10-CM

## 2015-07-24 DIAGNOSIS — C7951 Secondary malignant neoplasm of bone: Secondary | ICD-10-CM

## 2015-07-24 DIAGNOSIS — G8220 Paraplegia, unspecified: Secondary | ICD-10-CM

## 2015-07-24 DIAGNOSIS — R6889 Other general symptoms and signs: Secondary | ICD-10-CM

## 2015-07-24 DIAGNOSIS — C50911 Malignant neoplasm of unspecified site of right female breast: Secondary | ICD-10-CM

## 2015-07-24 MED ORDER — EXEMESTANE 25 MG PO TABS: 25.0000 mg | ORAL_TABLET | Freq: Every day | ORAL | Status: DC

## 2015-07-24 NOTE — Therapy (Signed)
[facility] [address] [place], [place], [address] Phone: [phone]   Fax:  [phone]  Physical Therapy Treatment  Patient Details  Name: Heather [name] MRN: [id number] Date of Birth: 1945/10/31 Referring Provider: Alger Simons, MD  Encounter Date: 07/24/2015      PT End of Session - 07/24/15 1059    Visit Number 12   Number of Visits 24   Date for PT Re-Evaluation 08/21/15   PT Start Time 0935   PT Stop Time 1020   PT Time Calculation (min) 45 min   Equipment Utilized During Treatment Gait belt   Activity Tolerance Patient tolerated treatment well   Behavior During Therapy [facility] for tasks assessed/performed      Past Medical History  Diagnosis Date  . Chronic kidney disease   . Hx: UTI (urinary tract infection)   . Right shoulder pain   . Right knee pain   . Hypertension     recently started Aldactone   . Hyperlipidemia     but not on meds;diet and exercise controlled  . Dizziness     has been going on for 27month;medical MD aware  . H/O hiatal hernia   . GERD (gastroesophageal reflux disease)     Tums prn  . Hemorrhoids   . Urinary frequency     d/t taking Aldactone  . History of UTI   . Anemia     hx of  . Cataract     immature-not sure of which eye  . Anxiety     r/t updated surgery  . Insomnia     takes Melatoniin daily  . Breast cancer ([place]     right  . Skin cancer   . Hx of radiation therapy 10/25/12- 12/11/12    right chest wall/regional lymph nodes 5040 cGy, 28 sessions, right chest wall boost 1000 cGy 1 session  . H. pylori infection   . Metastasis to spinal column (HCC)     T5  . Complication of anesthesia     pt states she is sensitive to meds  . PONV (postoperative nausea and vomiting)     pt experienced hair loss, confusion and combative- 02/15/15    Past Surgical History  Procedure Laterality Date  . Cyst removed from left breast  1970  . Left wrist surgery   2004   with plate  . Colonoscopy    . Esophagogastroduodenoscopy    . Mastectomy, radical    . Mastectomy modified radical  05/10/2012    Procedure: MASTECTOMY MODIFIED RADICAL;  Surgeon: PMerrie Roof MD;  [facility]  Service: General;  Laterality: Right;  RIGHT MODIFIED RADICAL MASTECTOMY  . Portacath placement  05/10/2012    Procedure: INSERTION PORT-A-CATH;  Surgeon: PMerrie Roof MD;  [facility]  Service: General;  Laterality: Left;  . Decompressive lumbar laminectomy level 4 N/A 02/15/2015    Procedure: DECOMPRESSION T5 AND T3-T7 STABALIZATION;  Surgeon: NConsuella Lose MD;  [facility];  Service: Neurosurgery;  Laterality: N/A;  . Laminectomy N/A 03/27/2015    Procedure: Thoracic Four-Thoracic Six Laminectomy for tumor;  Surgeon: NConsuella Lose MD;  [facility];  Service: Neurosurgery;  Laterality: N/A;  T4-T6 Laminectomy    There were no vitals filed for this visit.  Visit Diagnosis:  Incomplete spinal cord lesion at T1-T6 level without bone injury, initial encounter ([facility]  Decreased strength, endurance, and mobility  Paraplegia at T4 level (HCC)  Abnormality of  gait  Weakness of both lower extremities  Pain      Subjective Assessment - 07/24/15 1058    Subjective Doing well today; had treatment yesterday and feels ok.  Ready to get new shoes so she can walk more at home.   Patient Stated Goals improve mobility; walk   Currently in Pain? No/denies            [facility] PT Assessment - 07/24/15 0951    Ambulation/Gait   Gait velocity 0.66 ft/sec  35m 49.81                     [facility] Adult PT Treatment/Exercise - 07/24/15 0951    Ambulation/Gait   Ambulation/Gait Yes   Ambulation/Gait Assistance 4: Min assist;4: Min guard   Ambulation Distance (Feet) 220 Feet  110' x 2   Assistive device Rolling walker   Gait Pattern Step-to pattern;Decreased hip/knee flexion - right;Right genu recurvatum;Left genu recurvatum    Ambulation Surface Level;Indoor   Knee/Hip Exercises: Aerobic   Other Aerobic SciFit L 3 x 5 min   Knee/Hip Exercises: Machines for Strengthening   Total Gym Leg Press 60# x 10; 65# x 10; SL LLE 35#x10; RLE 35#x5; 30# x 5                  PT Short Term Goals - 07/24/15 02952   PT SHORT TERM GOAL #1   Title independent with initial HEP (07/24/15)   Status Achieved   PT SHORT TERM GOAL #2   Title ambulate > 627 with RW with min A for improved mobility and household ambulation    Status Achieved   PT SHORT TERM GOAL #3   Title perform sit to/from stand transfers with supervision for improved independence    Status Achieved   PT SHORT TERM GOAL #4   Title improve gait velocity to > 0.5 ft/sec for improved function and mobility   Status Achieved           PT Long Term Goals - 06/29/15 1041    PT LONG TERM GOAL #1   Title ambulate > 100' with supervision with RW for improved mobility (08/21/15)   Status New   PT LONG TERM GOAL #2   Title improve gait velocity to > 1.0 ft/sec for improved function and mobility    Status New   PT LONG TERM GOAL #3   Title stand unsupported x 5 min with supervision for improved lower extremity strength and function    Status New   PT LONG TERM GOAL #4   Title perform stand pivot transfers with supervision for improved function and independence   Status New               Plan - 07/24/15 1059    Clinical Impression Statement Pt continues to progress well with ambulation with bil AFOs needing only occasional min A due to increased step length.  Pt demonstrated ability to self correct LOB.  Verbalized independence of HEP and has met all STGs.    PT Next Visit Plan standing in parallel bars, gait and standing tolerance; LE strengthening possibly using Leg press if appropriate per pt request; core/hip strengthening   Consulted and Agree with Plan of Care Patient        Problem List Patient Active Problem List   Diagnosis Date  Noted  . Anemia of chronic disease 05/21/2015  . Metastatic cancer to spine ([facility] 03/27/2015  . Thrombocytopenia ([facility] 03/12/2015  .  Prerenal azotemia 03/12/2015  . Pain   . Recurrent UTI--due to Klebsiella   . Paraplegia at T4 level ([place]) 02/20/2015  . Neurogenic bowel 02/20/2015  . Neurogenic bladder 02/20/2015  . Postoperative anemia due to acute blood loss 02/17/2015  . Spinal cord compression due to malignant neoplasm metastatic to spine ([place]) 02/15/2015  . Epidural mass 02/15/2015  . Thoracic spine tumor   . Pathologic fracture of vertebra 02/14/2015  . Pathologic fracture of thoracic vertebrae 02/14/2015  . Breast cancer metastasized to bone ([place]) 02/14/2015  . Hyperlipidemia 02/14/2015  . H. pylori infection   . HTN (hypertension) 04/17/2012  . Primary cancer of upper outer quadrant of right female breast ([place]) 03/08/2012   Laureen Abrahams, PT, DPT 07/24/2015 11:06 AM  [facility] [address] [place], [place], [address] Phone: [phone]   Fax:  [phone]  Name: KYLIYAH STIRN MRN: [id number] Date of Birth: [birth year]

## 2015-07-24 NOTE — Therapy (Signed)
[facility] [address] [place], [place], [address] Phone: [phone]   Fax:  [phone]  Occupational Therapy Treatment  Patient Details  Name: Heather [name] MRN: [id number] Date of Birth: March 10, 1946 Referring Provider: Dr. Naaman Plummer   Encounter Date: 07/24/2015      OT End of Session - 07/24/15 1326    Visit Number 2   Number of Visits 17   Date for OT Re-Evaluation 09/04/15   Authorization Type medicare - g code needed every 10 visits   Authorization - Visit Number 2   Authorization - Number of Visits 10   OT Start Time 1100   OT Stop Time 1150   OT Time Calculation (min) 50 min   Activity Tolerance Patient tolerated treatment well      Past Medical History  Diagnosis Date  . Chronic kidney disease   . Hx: UTI (urinary tract infection)   . Right shoulder pain   . Right knee pain   . Hypertension     recently started Aldactone   . Hyperlipidemia     but not on meds;diet and exercise controlled  . Dizziness     has been going on for 71month;medical MD aware  . H/O hiatal hernia   . GERD (gastroesophageal reflux disease)     Tums prn  . Hemorrhoids   . Urinary frequency     d/t taking Aldactone  . History of UTI   . Anemia     hx of  . Cataract     immature-not sure of which eye  . Anxiety     r/t updated surgery  . Insomnia     takes Melatoniin daily  . Breast cancer ([facility]     right  . Skin cancer   . Hx of radiation therapy 10/25/12- 12/11/12    right chest wall/regional lymph nodes 5040 cGy, 28 sessions, right chest wall boost 1000 cGy 1 session  . H. pylori infection   . Metastasis to spinal column (HCC)     T5  . Complication of anesthesia     pt states she is sensitive to meds  . PONV (postoperative nausea and vomiting)     pt experienced hair loss, confusion and combative- 02/15/15    Past Surgical History  Procedure Laterality Date  . Cyst removed from left breast  1970  . Left  wrist surgery   2004    with plate  . Colonoscopy    . Esophagogastroduodenoscopy    . Mastectomy, radical    . Mastectomy modified radical  05/10/2012    Procedure: MASTECTOMY MODIFIED RADICAL;  Surgeon: PMerrie Roof MD;  [facility]  Service: General;  Laterality: Right;  RIGHT MODIFIED RADICAL MASTECTOMY  . Portacath placement  05/10/2012    Procedure: INSERTION PORT-A-CATH;  Surgeon: PMerrie Roof MD;  [facility]  Service: General;  Laterality: Left;  . Decompressive lumbar laminectomy level 4 N/A 02/15/2015    Procedure: DECOMPRESSION T5 AND T3-T7 STABALIZATION;  Surgeon: NConsuella Lose MD;  [facility];  Service: Neurosurgery;  Laterality: N/A;  . Laminectomy N/A 03/27/2015    Procedure: Thoracic Four-Thoracic Six Laminectomy for tumor;  Surgeon: NConsuella Lose MD;  [facility];  Service: Neurosurgery;  Laterality: N/A;  T4-T6 Laminectomy    There were no vitals filed for this visit.  Visit Diagnosis:  Decreased strength, endurance, and mobility      Subjective Assessment - 07/24/15 1106  Subjective  I feel pretty good today   Pertinent History Diagnosed with Breast CA Oct 2013, mastectomy Jan 2014. T5 resection Oct 2016 with consequent incomplete SCI   Limitations NO heat modalities   Patient Stated Goals I want to be able to walk. I would love to be able to go into my kitchen and stand to clean my counters. I want to get off my blood thinner.    Currently in Pain? No/denies                      OT Treatments/Exercises (OP) - 07/24/15 0001    ADLs   ADL Comments Discussed importance of maintaining shoulder integrity and strengthening posterior sh. girdle due to self propelling w/c over long periods of time can cause issues with shoulder   Exercises   Exercises Shoulder   Shoulder Exercises: ROM/Strengthening   Other ROM/Strengthening Exercises Bilateral shoulder horizontal abduction, bilateral shoulder extension,  and bilateral rows x 15 reps each ex. with red theraband resistance (see pt instructions)    Neurological Re-education Exercises   Trunk Exercises Core Activation   Trunk Core Activation core activation from semi-reclined position to upright position x 10 at EOB. Also worked on controlled A-P pelvic tilt in w/c. Obliques activation with diagonal patterns bilaterally. Lateral trunk flexion ex's to engage side abdominal muscles at EOB.                 OT Education - 07/24/15 1149    Education provided Yes   Education Details Theraband HEP, Postural ex   Person(s) Educated Patient   Methods Explanation;Demonstration;Handout   Comprehension Verbalized understanding;Returned demonstration          OT Short Term Goals - 07/24/15 1329    OT SHORT TERM GOAL #1   Title Pt will be independent with initial UE & core HEP   Time 4   Period Weeks   Status On-going   OT SHORT TERM GOAL #2   Title Pt will be independent with lateral leans to pull pants up/down for ADLs   Time 4   Period Weeks   Status New   OT SHORT TERM GOAL #3   Title Pt will be supervision in standing position with RW for support for pulling pants up/down during dressing and toileting needs   Time 4   Period Weeks   Status Achieved  per pt report   OT SHORT TERM GOAL #4   Title Pt will be able to stand for at least 10 minutes during ADL task with no rest break   Time 4   Period Weeks   Status New   OT SHORT TERM GOAL #5   Title Pt will complete simple meal prep or cooking activity in sit to/from stand position with supervision using RW prn   Time 4   Period Weeks   Status New           OT Long Term Goals - 07/10/15 1446    OT LONG TERM GOAL #1   Title Pt will be independent with upgraded UE & core HEP   Time 8   Period Weeks   Status New   OT LONG TERM GOAL #2   Title Pt will be mod I with shower stall transfers using shower bench and grab bars prn   Time 8   Period Weeks   Status New   OT  LONG TERM GOAL #3   Title Pt will tolerate dynamic standing activity,  task, or ADL for at least 20 minutes with only one seated rest break if needed    Time 8   Period Weeks   Status New   OT LONG TERM GOAL #4   Title Pt will be mod I with simple meal prep or cooking activity in sit to/from stand position using RW prn   Time 8   Period Weeks   Status New   OT LONG TERM GOAL #5   Title --   Time --   Period --   Status --               Plan - 07/24/15 1327    Clinical Impression Statement Pt with increased awareness into maintaining shoulder integrity and postural awareness/control.    Plan review HEP prn, kitchen task (retrieving items out of cabinets, beverage and/or snack prep)   Consulted and Agree with Plan of Care Patient        Problem List Patient Active Problem List   Diagnosis Date Noted  . Anemia of chronic disease 05/21/2015  . Metastatic cancer to spine ([place] [facility]) 03/27/2015  . Thrombocytopenia ([place]) 03/12/2015  . Prerenal azotemia 03/12/2015  . Pain   . Recurrent UTI--due to Klebsiella   . Paraplegia at T4 level ([place]) 02/20/2015  . Neurogenic bowel 02/20/2015  . Neurogenic bladder 02/20/2015  . Postoperative anemia due to acute blood loss 02/17/2015  . Spinal cord compression due to malignant neoplasm metastatic to spine ([place]) 02/15/2015  . Epidural mass 02/15/2015  . Thoracic spine tumor   . Pathologic fracture of vertebra 02/14/2015  . Pathologic fracture of thoracic vertebrae 02/14/2015  . Breast cancer metastasized to bone ([place]) 02/14/2015  . Hyperlipidemia 02/14/2015  . H. pylori infection   . HTN (hypertension) 04/17/2012  . Primary cancer of upper outer quadrant of right female breast ([place]) 03/08/2012    Carey Bullocks, OTR/L 07/24/2015, 1:30 PM  [facility] [address] [place], [place], [address] Phone: [phone]   Fax:  [phone]  Name: Heather [name] MRN:  [id number] Date of Birth: 1946/02/26

## 2015-07-24 NOTE — Patient Instructions (Addendum)
EXTENSION: Sitting - Resistance Band (Active)    Sit with both arms at side. Against red resistance band, draw arm backward, as far as possible, keeping elbows STRAIGHT. Complete _1__ sets of _15__ repetitions. Perform _2__ sessions per day, every other day.   Scapular Retraction: Bilateral    Facing anchor, pull arms back, bringing shoulder blades together. Repeat __15__ times per set. Do __1__ sets per session. Do _2___ sessions per day, every other day.     Resisted Horizontal Abduction: Bilateral   Sit, hold tubing in both hands, arms out in front. Keeping arms straight, pinch shoulder blades together and stretch arms out. Repeat _15___ times per set. Do _2___ sessions per day, every other day.    PELVIC TILT: Anterior    Start in slumped position. Roll pelvis forward to arch back. _15__ reps per set, _6__ sets per day,  Copyright  VHI. All rights reserved.

## 2015-07-27 ENCOUNTER — Encounter: Payer: Self-pay | Admitting: Physical Therapy

## 2015-07-27 ENCOUNTER — Ambulatory Visit: Payer: Medicare Other | Admitting: Physical Therapy

## 2015-07-27 ENCOUNTER — Telehealth: Payer: Self-pay | Admitting: *Deleted

## 2015-07-27 ENCOUNTER — Ambulatory Visit: Payer: Medicare Other | Admitting: Occupational Therapy

## 2015-07-27 DIAGNOSIS — R6889 Other general symptoms and signs: Secondary | ICD-10-CM

## 2015-07-27 DIAGNOSIS — R269 Unspecified abnormalities of gait and mobility: Secondary | ICD-10-CM

## 2015-07-27 DIAGNOSIS — M6289 Other specified disorders of muscle: Secondary | ICD-10-CM | POA: Diagnosis not present

## 2015-07-27 DIAGNOSIS — R531 Weakness: Secondary | ICD-10-CM

## 2015-07-27 DIAGNOSIS — Z7409 Other reduced mobility: Secondary | ICD-10-CM

## 2015-07-27 DIAGNOSIS — S24151A Other incomplete lesion at T1 level of thoracic spinal cord, initial encounter: Secondary | ICD-10-CM | POA: Diagnosis not present

## 2015-07-27 DIAGNOSIS — R29898 Other symptoms and signs involving the musculoskeletal system: Secondary | ICD-10-CM

## 2015-07-27 DIAGNOSIS — R52 Pain, unspecified: Secondary | ICD-10-CM | POA: Diagnosis not present

## 2015-07-27 DIAGNOSIS — G8220 Paraplegia, unspecified: Secondary | ICD-10-CM

## 2015-07-27 MED ORDER — LETROZOLE 2.5 MG PO TABS: 2.5000 mg | ORAL_TABLET | Freq: Every day | ORAL | Status: DC

## 2015-07-27 NOTE — Therapy (Signed)
[facility] [address] [place], [place], [address] Phone: [phone]   Fax:  [phone]  Occupational Therapy Treatment  Patient Details  Name: Heather [name] MRN: [id number] Date of Birth: 07/24/1945 Referring Provider: Dr. Naaman Plummer   Encounter Date: 07/27/2015      OT End of Session - 07/27/15 1119    Visit Number 3   Number of Visits 17   Date for OT Re-Evaluation 09/04/15   Authorization Type medicare - g code needed every 10 visits   Authorization - Visit Number 3   Authorization - Number of Visits 10   OT Start Time 0845   OT Stop Time 0930   OT Time Calculation (min) 45 min   Activity Tolerance Patient tolerated treatment well      Past Medical History  Diagnosis Date  . Chronic kidney disease   . Hx: UTI (urinary tract infection)   . Right shoulder pain   . Right knee pain   . Hypertension     recently started Aldactone   . Hyperlipidemia     but not on meds;diet and exercise controlled  . Dizziness     has been going on for 69month;medical MD aware  . H/O hiatal hernia   . GERD (gastroesophageal reflux disease)     Tums prn  . Hemorrhoids   . Urinary frequency     d/t taking Aldactone  . History of UTI   . Anemia     hx of  . Cataract     immature-not sure of which eye  . Anxiety     r/t updated surgery  . Insomnia     takes Melatoniin daily  . Breast cancer ([facility]     right  . Skin cancer   . Hx of radiation therapy 10/25/12- 12/11/12    right chest wall/regional lymph nodes 5040 cGy, 28 sessions, right chest wall boost 1000 cGy 1 session  . H. pylori infection   . Metastasis to spinal column (H[facility])     T5  . Complication of anesthesia     pt states she is sensitive to meds  . PONV (postoperative nausea and vomiting)     pt experienced hair loss, confusion and combative- 02/15/15    Past Surgical History  Procedure Laterality Date  . Cyst removed from left breast  1970  . Left  wrist surgery   2004    with plate  . Colonoscopy    . Esophagogastroduodenoscopy    . Mastectomy, radical    . Mastectomy modified radical  05/10/2012    Procedure: MASTECTOMY MODIFIED RADICAL;  Surgeon: PMerrie Roof MD;  [facility]  Service: General;  Laterality: Right;  RIGHT MODIFIED RADICAL MASTECTOMY  . Portacath placement  05/10/2012    Procedure: INSERTION PORT-A-CATH;  Surgeon: PMerrie Roof MD;  [facility]  Service: General;  Laterality: Left;  . Decompressive lumbar laminectomy level 4 N/A 02/15/2015    Procedure: DECOMPRESSION T5 AND T3-T7 STABALIZATION;  Surgeon: NConsuella Lose MD;  [facility];  Service: Neurosurgery;  Laterality: N/A;  . Laminectomy N/A 03/27/2015    Procedure: Thoracic Four-Thoracic Six Laminectomy for tumor;  Surgeon: NConsuella Lose MD;  [facility];  Service: Neurosurgery;  Laterality: N/A;  T4-T6 Laminectomy    There were no vitals filed for this visit.  Visit Diagnosis:  Decreased strength, endurance, and mobility  Incomplete spinal cord lesion at T1-T6 level without bone injury,  initial encounter [facility])      Subjective Assessment - 07/27/15 0942    Subjective  I didn't get a chance to do my arm exercises this weekend   Pertinent History Diagnosed with Breast CA Oct 2013, mastectomy Jan 2014. T5 resection Oct 2016 with consequent incomplete SCI   Limitations NO heat modalities   Patient Stated Goals I want to be able to walk. I would love to be able to go into my kitchen and stand to clean my counters. I want to get off my blood thinner.    Currently in Pain? No/denies                      OT Treatments/Exercises (OP) - 07/27/15 1113    ADLs   Cooking Pt performed simple sandwich prep from w/c and standing level. Pt gathered ingredients, transported ingredients, and moved t/o kitchen from w/c level. Then would stand to retrieve items from shelf including peanut butter and plate. Pt also  stood to make sandwich with close supervision and w/c behind pt (prn). Discussed safety recommendations for balance and fall prevention. Also discussed future needs when ambulating fully with walker including proper walker negotiation and need for walker tray and/or basket.    Home Maintenance Problem solved ways to transport laundry in w/c. Then problem solved ways to transport once fully ambulatory with walker including: use of backpack and/or rolling cart (when pt safe/stable with ambulation)   Shoulder Exercises: ROM/Strengthening   Other ROM/Strengthening Exercises Reviewed theraband HEP - pt return demo x 15 reps each. Also, reviewed postural ex's/core strengthening ex's.                   OT Short Term Goals - 07/24/15 1329    OT SHORT TERM GOAL #1   Title Pt will be independent with initial UE & core HEP   Time 4   Period Weeks   Status On-going   OT SHORT TERM GOAL #2   Title Pt will be independent with lateral leans to pull pants up/down for ADLs   Time 4   Period Weeks   Status New   OT SHORT TERM GOAL #3   Title Pt will be supervision in standing position with RW for support for pulling pants up/down during dressing and toileting needs   Time 4   Period Weeks   Status Achieved  per pt report   OT SHORT TERM GOAL #4   Title Pt will be able to stand for at least 10 minutes during ADL task with no rest break   Time 4   Period Weeks   Status New   OT SHORT TERM GOAL #5   Title Pt will complete simple meal prep or cooking activity in sit to/from stand position with supervision using RW prn   Time 4   Period Weeks   Status New           OT Long Term Goals - 07/10/15 1446    OT LONG TERM GOAL #1   Title Pt will be independent with upgraded UE & core HEP   Time 8   Period Weeks   Status New   OT LONG TERM GOAL #2   Title Pt will be mod I with shower stall transfers using shower bench and grab bars prn   Time 8   Period Weeks   Status New   OT LONG TERM  GOAL #3   Title Pt will tolerate dynamic standing  activity, task, or ADL for at least 20 minutes with only one seated rest break if needed    Time 8   Period Weeks   Status New   OT LONG TERM GOAL #4   Title Pt will be mod I with simple meal prep or cooking activity in sit to/from stand position using RW prn   Time 8   Period Weeks   Status New   OT LONG TERM GOAL #5   Title --   Time --   Period --   Status --               Plan - 07/27/15 1119    Clinical Impression Statement Pt progressing towards remaining STG's and greater independence with light home maintenance tasks.    Plan postural ex's/trunk control on compliant surfaces at mat, ? progress to physioball with assist   Consulted and Agree with Plan of Care Patient        Problem List Patient Active Problem List   Diagnosis Date Noted  . Anemia of chronic disease 05/21/2015  . Metastatic cancer to spine ([place]) 03/27/2015  . Thrombocytopenia ([place]) 03/12/2015  . Prerenal azotemia 03/12/2015  . Pain   . Recurrent UTI--due to Klebsiella   . Paraplegia at T4 level ([place]) 02/20/2015  . Neurogenic bowel 02/20/2015  . Neurogenic bladder 02/20/2015  . Postoperative anemia due to acute blood loss 02/17/2015  . Spinal cord compression due to malignant neoplasm metastatic to spine ([place]) 02/15/2015  . Epidural mass 02/15/2015  . Thoracic spine tumor   . Pathologic fracture of vertebra 02/14/2015  . Pathologic fracture of thoracic vertebrae 02/14/2015  . Breast cancer metastasized to bone ([place]) 02/14/2015  . Hyperlipidemia 02/14/2015  . H. pylori infection   . HTN (hypertension) 04/17/2012  . Primary cancer of upper outer quadrant of right female breast ([place]) 03/08/2012    Carey Bullocks, OTR/L 07/27/2015, 11:21 AM  [facility] [address] [place], [place], [address] Phone: [phone]   Fax:  [phone]  Name: Heather [name] MRN:  [id number] Date of Birth: 1945/06/15

## 2015-07-27 NOTE — Patient Instructions (Signed)
Walking at home  Practice 90 degrees turns to from w/c to chair 180 degree turns walking 10 ft from chair. 10 ft to chair around obstacles for "s" curve.  Perform with close supervision

## 2015-07-27 NOTE — Telephone Encounter (Signed)
Per Dr [name], ok for pt to switch to letrozole.  Let patient know and sent new Rx to [facility].

## 2015-07-27 NOTE — Therapy (Signed)
[facility] [address] [place], [place], [address] Phone: [phone]   Fax:  [phone]  Physical Therapy Treatment  Patient Details  Name: Heather [name] MRN: [id number] Date of Birth: 12-16-1945 Referring Provider: Alger Simons, MD  Encounter Date: 07/27/2015      PT End of Session - 07/27/15 1215    Visit Number 13   Number of Visits 24   Date for PT Re-Evaluation 08/21/15   PT Start Time 0930   PT Stop Time 1015   PT Time Calculation (min) 45 min   Equipment Utilized During Treatment Gait belt   Activity Tolerance Patient tolerated treatment well   Behavior During Therapy [facility] for tasks assessed/performed      Past Medical History  Diagnosis Date  . Chronic kidney disease   . Hx: UTI (urinary tract infection)   . Right shoulder pain   . Right knee pain   . Hypertension     recently started Aldactone   . Hyperlipidemia     but not on meds;diet and exercise controlled  . Dizziness     has been going on for 30month;medical MD aware  . H/O hiatal hernia   . GERD (gastroesophageal reflux disease)     Tums prn  . Hemorrhoids   . Urinary frequency     d/t taking Aldactone  . History of UTI   . Anemia     hx of  . Cataract     immature-not sure of which eye  . Anxiety     r/t updated surgery  . Insomnia     takes Melatoniin daily  . Breast cancer ([facility]     right  . Skin cancer   . Hx of radiation therapy 10/25/12- 12/11/12    right chest wall/regional lymph nodes 5040 cGy, 28 sessions, right chest wall boost 1000 cGy 1 session  . H. pylori infection   . Metastasis to spinal column (HCC)     T5  . Complication of anesthesia     pt states she is sensitive to meds  . PONV (postoperative nausea and vomiting)     pt experienced hair loss, confusion and combative- 02/15/15    Past Surgical History  Procedure Laterality Date  . Cyst removed from left breast  1970  . Left wrist surgery   2004   with plate  . Colonoscopy    . Esophagogastroduodenoscopy    . Mastectomy, radical    . Mastectomy modified radical  05/10/2012    Procedure: MASTECTOMY MODIFIED RADICAL;  Surgeon: PMerrie Roof MD;  [facility]  Service: General;  Laterality: Right;  RIGHT MODIFIED RADICAL MASTECTOMY  . Portacath placement  05/10/2012    Procedure: INSERTION PORT-A-CATH;  Surgeon: PMerrie Roof MD;  [facility]  Service: General;  Laterality: Left;  . Decompressive lumbar laminectomy level 4 N/A 02/15/2015    Procedure: DECOMPRESSION T5 AND T3-T7 STABALIZATION;  Surgeon: NConsuella Lose MD;  [facility];  Service: Neurosurgery;  Laterality: N/A;  . Laminectomy N/A 03/27/2015    Procedure: Thoracic Four-Thoracic Six Laminectomy for tumor;  Surgeon: NConsuella Lose MD;  [facility];  Service: Neurosurgery;  Laterality: N/A;  T4-T6 Laminectomy    There were no vitals filed for this visit.  Visit Diagnosis:  Decreased strength, endurance, and mobility  Incomplete spinal cord lesion at T1-T6 level without bone injury, initial encounter ([facility]  Paraplegia at T4 level ([facility]  Abnormality of  gait  Weakness of both lower extremities      Subjective Assessment - 07/27/15 0936    Subjective Doing more functional standing at home with w/c behind and with UE support.   Limitations House hold activities;Standing;Walking   How long can you walk comfortably? first time 2/13: 30-40 ft with RW   Patient Stated Goals improve mobility; walk   Currently in Pain? Yes   Pain Score 2    Pain Location Back   Pain Orientation Lower   Pain Descriptors / Indicators Aching   Pain Type Chronic pain   Pain Onset More than a month ago   Pain Frequency Constant   Aggravating Factors  activity   Pain Relieving Factors seated rest                         [facility] Adult PT Treatment/Exercise - 07/27/15 0001    Transfers   Sit-pivot 5: Supervision   Stand<> Sit 5:  Supervision   Comments Multiple reps with 90degree and 180 degree turns working on technique and safety awareness.   Ambulation/Gait   Ambulation/Gait Yes   Ambulation/Gait Assistance 5: Supervision to MinA due to LOBx1   Ambulation Distance (Feet) 10 Feet  -15 feet; multiple reps for functional household gait   Assistive device Rolling walker   Gait Pattern Step-through pattern;Decreased stride length   Ambulation Surface Level;Indoor                PT Education - 07/27/15 1010    Education provided Yes   Education Details Safety technique for functional gait at home and how to practice at home. Discussed increased fall risk with standing at home and safety awareness issues with distractions and varying levels of fatigue during gait.   Person(s) Educated Patient   Methods Explanation;Demonstration;Tactile cues;Verbal cues;Handout   Comprehension Verbalized understanding;Returned demonstration;Tactile cues required;Need further instruction          PT Short Term Goals - 07/24/15 0953    PT SHORT TERM GOAL #1   Title independent with initial HEP (07/24/15)   Status Achieved   PT SHORT TERM GOAL #2   Title ambulate > 59' with RW with min A for improved mobility and household ambulation    Status Achieved   PT SHORT TERM GOAL #3   Title perform sit to/from stand transfers with supervision for improved independence    Status Achieved   PT SHORT TERM GOAL #4   Title improve gait velocity to > 0.5 ft/sec for improved function and mobility   Status Achieved           PT Long Term Goals - 06/29/15 1041    PT LONG TERM GOAL #1   Title ambulate > 100' with supervision with RW for improved mobility (08/21/15)   Status New   PT LONG TERM GOAL #2   Title improve gait velocity to > 1.0 ft/sec for improved function and mobility    Status New   PT LONG TERM GOAL #3   Title stand unsupported x 5 min with supervision for improved lower extremity strength and function    Status  New   PT LONG TERM GOAL #4   Title perform stand pivot transfers with supervision for improved function and independence   Status New               Plan - 07/27/15 1216    Clinical Impression Statement Skilled session focused on functional transfers and household  gait and Gait HEP to practice with close supervision level.  Pt reqired less A with transfers and gait at small distances but was still unsteady with turns and had LOBx1 posteriorly requiring min A.                                                             Pt will benefit from skilled therapeutic intervention in order to improve on the following deficits Abnormal gait;Difficulty walking;Pain;Impaired sensation;Decreased strength;Decreased mobility;Decreased balance   Rehab Potential Good   PT Frequency 3x / week   PT Duration 8 weeks   PT Treatment/Interventions ADLs/Self Care Home Management;Electrical Stimulation;Therapeutic exercise;Therapeutic activities;Functional mobility training;Stair training;Gait training;DME Instruction;Balance training;Neuromuscular re-education;Patient/family education;Orthotic Fit/Training   PT Next Visit Plan standing in parallel bars, gait and standing tolerance; LE strengthening possibly using Leg press if appropriate per pt request; core/hip strengthening   Consulted and Agree with Plan of Care Patient        Problem List Patient Active Problem List   Diagnosis Date Noted  . Anemia of chronic disease 05/21/2015  . Metastatic cancer to spine ([place]) 03/27/2015  . Thrombocytopenia ([place]) 03/12/2015  . Prerenal azotemia 03/12/2015  . Pain   . Recurrent UTI--due to Klebsiella   . Paraplegia at T4 level ([place]) 02/20/2015  . Neurogenic bowel 02/20/2015  . Neurogenic bladder 02/20/2015  . Postoperative anemia due to acute blood loss 02/17/2015  . Spinal cord compression due to malignant neoplasm metastatic to spine ([place]) 02/15/2015  . Epidural mass 02/15/2015  . Thoracic spine tumor   .  Pathologic fracture of vertebra 02/14/2015  . Pathologic fracture of thoracic vertebrae 02/14/2015  . Breast cancer metastasized to bone ([place]) 02/14/2015  . Hyperlipidemia 02/14/2015  . H. pylori infection   . HTN (hypertension) 04/17/2012  . Primary cancer of upper outer quadrant of right female breast ([place]) 03/08/2012    Bjorn Loser, PTA  07/27/2015, 4:27 PM [facility] [address] [place], [place], [address] Phone: [phone]   Fax:  [phone]  Name: Heather [name] MRN: [id number] Date of Birth: 1945/05/15

## 2015-07-27 NOTE — Telephone Encounter (Addendum)
"   I apologize for having read reviews of the Exemestane.  The side effects scare me and it may continue to cause hair loss.  The cost at [facility] $300.00 with [employer], $500.00 without.  All these medicines work the same.  Is it logical for me to continue Letrozole since I am on Herceptin?  Letrozole did not cause hair loss and cost less.  If he wants me to try the new medicine let me know.  Return number [phone]."

## 2015-07-29 ENCOUNTER — Ambulatory Visit: Payer: Medicare Other | Admitting: Occupational Therapy

## 2015-07-29 ENCOUNTER — Encounter: Payer: Medicare Other | Attending: Physical Medicine & Rehabilitation | Admitting: Physical Medicine & Rehabilitation

## 2015-07-29 ENCOUNTER — Ambulatory Visit: Payer: Medicare Other | Admitting: Physical Therapy

## 2015-07-29 ENCOUNTER — Encounter: Payer: Self-pay | Admitting: Physical Therapy

## 2015-07-29 ENCOUNTER — Encounter: Payer: Self-pay | Admitting: Physical Medicine & Rehabilitation

## 2015-07-29 VITALS — BP 127/81 | HR 85 | Resp 14

## 2015-07-29 DIAGNOSIS — R42 Dizziness and giddiness: Secondary | ICD-10-CM | POA: Insufficient documentation

## 2015-07-29 DIAGNOSIS — K649 Unspecified hemorrhoids: Secondary | ICD-10-CM | POA: Insufficient documentation

## 2015-07-29 DIAGNOSIS — M25561 Pain in right knee: Secondary | ICD-10-CM | POA: Diagnosis not present

## 2015-07-29 DIAGNOSIS — R531 Weakness: Secondary | ICD-10-CM

## 2015-07-29 DIAGNOSIS — Z9010 Acquired absence of unspecified breast and nipple: Secondary | ICD-10-CM | POA: Insufficient documentation

## 2015-07-29 DIAGNOSIS — F419 Anxiety disorder, unspecified: Secondary | ICD-10-CM | POA: Diagnosis not present

## 2015-07-29 DIAGNOSIS — R269 Unspecified abnormalities of gait and mobility: Secondary | ICD-10-CM

## 2015-07-29 DIAGNOSIS — Z7409 Other reduced mobility: Secondary | ICD-10-CM

## 2015-07-29 DIAGNOSIS — M25511 Pain in right shoulder: Secondary | ICD-10-CM | POA: Insufficient documentation

## 2015-07-29 DIAGNOSIS — M6289 Other specified disorders of muscle: Secondary | ICD-10-CM | POA: Diagnosis not present

## 2015-07-29 DIAGNOSIS — R6889 Other general symptoms and signs: Secondary | ICD-10-CM

## 2015-07-29 DIAGNOSIS — Z95828 Presence of other vascular implants and grafts: Secondary | ICD-10-CM | POA: Diagnosis not present

## 2015-07-29 DIAGNOSIS — G839 Paralytic syndrome, unspecified: Secondary | ICD-10-CM

## 2015-07-29 DIAGNOSIS — R29898 Other symptoms and signs involving the musculoskeletal system: Secondary | ICD-10-CM | POA: Diagnosis not present

## 2015-07-29 DIAGNOSIS — K219 Gastro-esophageal reflux disease without esophagitis: Secondary | ICD-10-CM | POA: Insufficient documentation

## 2015-07-29 DIAGNOSIS — N189 Chronic kidney disease, unspecified: Secondary | ICD-10-CM | POA: Diagnosis not present

## 2015-07-29 DIAGNOSIS — N319 Neuromuscular dysfunction of bladder, unspecified: Secondary | ICD-10-CM | POA: Diagnosis not present

## 2015-07-29 DIAGNOSIS — S24151A Other incomplete lesion at T1 level of thoracic spinal cord, initial encounter: Secondary | ICD-10-CM

## 2015-07-29 DIAGNOSIS — R262 Difficulty in walking, not elsewhere classified: Secondary | ICD-10-CM | POA: Diagnosis not present

## 2015-07-29 DIAGNOSIS — Z923 Personal history of irradiation: Secondary | ICD-10-CM | POA: Diagnosis not present

## 2015-07-29 DIAGNOSIS — I129 Hypertensive chronic kidney disease with stage 1 through stage 4 chronic kidney disease, or unspecified chronic kidney disease: Secondary | ICD-10-CM | POA: Insufficient documentation

## 2015-07-29 DIAGNOSIS — G9520 Unspecified cord compression: Secondary | ICD-10-CM

## 2015-07-29 DIAGNOSIS — G8220 Paraplegia, unspecified: Secondary | ICD-10-CM

## 2015-07-29 DIAGNOSIS — R52 Pain, unspecified: Secondary | ICD-10-CM | POA: Insufficient documentation

## 2015-07-29 DIAGNOSIS — Z85828 Personal history of other malignant neoplasm of skin: Secondary | ICD-10-CM | POA: Diagnosis not present

## 2015-07-29 DIAGNOSIS — D649 Anemia, unspecified: Secondary | ICD-10-CM | POA: Diagnosis not present

## 2015-07-29 DIAGNOSIS — C7951 Secondary malignant neoplasm of bone: Secondary | ICD-10-CM | POA: Diagnosis not present

## 2015-07-29 DIAGNOSIS — G9529 Other cord compression: Secondary | ICD-10-CM

## 2015-07-29 DIAGNOSIS — Z09 Encounter for follow-up examination after completed treatment for conditions other than malignant neoplasm: Secondary | ICD-10-CM | POA: Diagnosis not present

## 2015-07-29 DIAGNOSIS — G543 Thoracic root disorders, not elsewhere classified: Secondary | ICD-10-CM | POA: Insufficient documentation

## 2015-07-29 DIAGNOSIS — E785 Hyperlipidemia, unspecified: Secondary | ICD-10-CM | POA: Insufficient documentation

## 2015-07-29 NOTE — Therapy (Signed)
[facility] [address] [place], [place], [address] Phone: [phone]   Fax:  [phone]  Occupational Therapy Treatment  Patient Details  Name: Heather [name] MRN: [id number] Date of Birth: [birth year] Referring Provider: Dr. Naaman Plummer   Encounter Date: 07/29/2015      OT End of Session - 07/29/15 1027    Visit Number 4   Number of Visits 17   Date for OT Re-Evaluation 09/04/15   Authorization Type medicare - g code needed every 10 visits   Authorization - Visit Number 4   Authorization - Number of Visits 10   OT Start Time [phone]   OT Stop Time 1015   OT Time Calculation (min) 38 min   Activity Tolerance Patient tolerated treatment well      Past Medical History  Diagnosis Date  . Chronic kidney disease   . Hx: UTI (urinary tract infection)   . Right shoulder pain   . Right knee pain   . Hypertension     recently started Aldactone   . Hyperlipidemia     but not on meds;diet and exercise controlled  . Dizziness     has been going on for 47month;medical MD aware  . H/O hiatal hernia   . GERD (gastroesophageal reflux disease)     Tums prn  . Hemorrhoids   . Urinary frequency     d/t taking Aldactone  . History of UTI   . Anemia     hx of  . Cataract     immature-not sure of which eye  . Anxiety     r/t updated surgery  . Insomnia     takes Melatoniin daily  . Breast cancer ([facility]     right  . Skin cancer   . Hx of radiation therapy 10/25/12- 12/11/12    right chest wall/regional lymph nodes 5040 cGy, 28 sessions, right chest wall boost 1000 cGy 1 session  . H. pylori infection   . Metastasis to spinal column (HCC)     T5  . Complication of anesthesia     pt states she is sensitive to meds  . PONV (postoperative nausea and vomiting)     pt experienced hair loss, confusion and combative- 02/15/15    Past Surgical History  Procedure Laterality Date  . Cyst removed from left breast  1970  . Left  wrist surgery   2004    with plate  . Colonoscopy    . Esophagogastroduodenoscopy    . Mastectomy, radical    . Mastectomy modified radical  05/10/2012    Procedure: MASTECTOMY MODIFIED RADICAL;  Surgeon: PMerrie Roof MD;  [facility]  Service: General;  Laterality: Right;  RIGHT MODIFIED RADICAL MASTECTOMY  . Portacath placement  05/10/2012    Procedure: INSERTION PORT-A-CATH;  Surgeon: PMerrie Roof MD;  [facility]  Service: General;  Laterality: Left;  . Decompressive lumbar laminectomy level 4 N/A 02/15/2015    Procedure: DECOMPRESSION T5 AND T3-T7 STABALIZATION;  Surgeon: NConsuella Lose MD;  [facility];  Service: Neurosurgery;  Laterality: N/A;  . Laminectomy N/A 03/27/2015    Procedure: Thoracic Four-Thoracic Six Laminectomy for tumor;  Surgeon: NConsuella Lose MD;  [facility];  Service: Neurosurgery;  Laterality: N/A;  T4-T6 Laminectomy    There were no vitals filed for this visit.  Visit Diagnosis:  Decreased strength, endurance, and mobility  Postural fatigue      Subjective Assessment -  07/29/15 0945    Subjective  My exercises are going well   Pertinent History Diagnosed with Breast CA Oct 2013, mastectomy Jan 2014. T5 resection Oct 2016 with consequent incomplete SCI   Limitations NO heat modalities   Patient Stated Goals I want to be able to walk. I would love to be able to go into my kitchen and stand to clean my counters. I want to get off my blood thinner.    Currently in Pain? No/denies          TREATMENT:   Neuro Re-education for postural control: Pt seated EOB on compliant surface working on A-P pelvic tilts and lateral trunk flexion bilaterally for lateral activation. Progressed to seated EOB (firm surface) with postural righting reactions to RT/LT, posteriorly and anteriorly. Progressed to seated on physioball (with assist x 2 to get on/off ball) with close supervision to min assist to maintain Anterior pelvic tilt  with trunk rotation bilaterally, BUE horizontal abd/add and alternating marches BLE's. Pt also worked lateral trunk flexion with pelvic motion bilaterally with CGA.                     OT Short Term Goals - 07/24/15 1329    OT SHORT TERM GOAL #1   Title Pt will be independent with initial UE & core HEP   Time 4   Period Weeks   Status On-going   OT SHORT TERM GOAL #2   Title Pt will be independent with lateral leans to pull pants up/down for ADLs   Time 4   Period Weeks   Status New   OT SHORT TERM GOAL #3   Title Pt will be supervision in standing position with RW for support for pulling pants up/down during dressing and toileting needs   Time 4   Period Weeks   Status Achieved  per pt report   OT SHORT TERM GOAL #4   Title Pt will be able to stand for at least 10 minutes during ADL task with no rest break   Time 4   Period Weeks   Status New   OT SHORT TERM GOAL #5   Title Pt will complete simple meal prep or cooking activity in sit to/from stand position with supervision using RW prn   Time 4   Period Weeks   Status New           OT Long Term Goals - 07/10/15 1446    OT LONG TERM GOAL #1   Title Pt will be independent with upgraded UE & core HEP   Time 8   Period Weeks   Status New   OT LONG TERM GOAL #2   Title Pt will be mod I with shower stall transfers using shower bench and grab bars prn   Time 8   Period Weeks   Status New   OT LONG TERM GOAL #3   Title Pt will tolerate dynamic standing activity, task, or ADL for at least 20 minutes with only one seated rest break if needed    Time 8   Period Weeks   Status New   OT LONG TERM GOAL #4   Title Pt will be mod I with simple meal prep or cooking activity in sit to/from stand position using RW prn   Time 8   Period Weeks   Status New   OT LONG TERM GOAL #5   Title --   Time --   Period --  Status --               Plan - 07/29/15 1028    Clinical Impression Statement Pt  progressing with trunk control on compliant surfaces and on firm surface with dynamic activities   Plan dynamic standing with countertop support, simulated ADLS seated EOB for trunk control    Consulted and Agree with Plan of Care Patient        Problem List Patient Active Problem List   Diagnosis Date Noted  . Anemia of chronic disease 05/21/2015  . Metastatic cancer to spine ([place]) 03/27/2015  . Thrombocytopenia ([place]) 03/12/2015  . Prerenal azotemia 03/12/2015  . Pain   . Recurrent UTI--due to Klebsiella   . Paraplegia at T4 level ([place]) 02/20/2015  . Neurogenic bowel 02/20/2015  . Neurogenic bladder 02/20/2015  . Postoperative anemia due to acute blood loss 02/17/2015  . Spinal cord compression due to malignant neoplasm metastatic to spine ([place]) 02/15/2015  . Epidural mass 02/15/2015  . Thoracic spine tumor   . Pathologic fracture of vertebra 02/14/2015  . Pathologic fracture of thoracic vertebrae 02/14/2015  . Breast cancer metastasized to bone ([place]) 02/14/2015  . Hyperlipidemia 02/14/2015  . H. pylori infection   . HTN (hypertension) 04/17/2012  . Primary cancer of upper outer quadrant of right female breast ([place]) 03/08/2012    Carey Bullocks, OTR/L 07/29/2015, 10:30 AM  [facility] [address] [place], [place], [address] Phone: [phone]   Fax:  [phone]  Name: Heather [name] MRN: [id number] Date of Birth: Sep 28, 1945

## 2015-07-29 NOTE — Progress Notes (Signed)
Subjective:    Patient ID: Heather [name], female    DOB: 01/28/1946, 70 y.o.   MRN: [id number]  HPI  Heather [name] is here in follow up of her thoracic cord compression and paraplegia. She is walking with her AFO's and walker. She is getting to the bathroom by herself and is able to get out of the bed on her own. She piddles in the kitchen now.   She still feels that she fatigues easily. She sleeps more than before.  Coda is still working in outpt therapies 2-3 x per week which has been productive. She has gained a lot of confidence. She has not fallen at home for some time.   The gabapentin is helping her dysesthesias in her legs and back. Her back often bothers her at the end of the day and causes her to lay down.   She has an MRI pending for April.      Pain Inventory Average Pain 4 Pain Right Now 0 My pain is aching  In the last 24 hours, has pain interfered with the following? General activity NA Relation with others NA Enjoyment of life NA What TIME of day is your pain at its worst? daytime Sleep (in general) Good  Pain is worse with: inactivity Pain improves with: heat/ice Relief from Meds: 4  Mobility use a walker how many minutes can you walk? 10 ability to climb steps?  no do you drive?  no use a wheelchair transfers alone  Function retired I need assistance with the following:  meal prep, household duties and shopping Do you have any goals in this area?  yes  Neuro/Psych loss of taste or smell  Prior Studies Any changes since last visit?  no  Physicians involved in your care Any changes since last visit?  no   Family History  Problem Relation Age of Onset  . Leukemia Mother   . Colon cancer Brother   . Cancer Brother     Prostate  . Colon cancer Maternal Grandmother   . Stroke Father   . Diabetes Mellitus II Father    Social History   Social History  . Marital Status: Married    Spouse Name: N/A  . Number of Children: 2  . Years of  Education: N/A   Social History Main Topics  . Smoking status: Never Smoker   . Smokeless tobacco: Never Used  . Alcohol Use: No  . Drug Use: No  . Sexual Activity: Yes    Birth Control/ Protection: Post-menopausal   Other Topics Concern  . None   Social History Narrative   Past Surgical History  Procedure Laterality Date  . Cyst removed from left breast  1970  . Left wrist surgery   2004    with plate  . Colonoscopy    . Esophagogastroduodenoscopy    . Mastectomy, radical    . Mastectomy modified radical  05/10/2012    Procedure: MASTECTOMY MODIFIED RADICAL;  Surgeon: Merrie Roof, MD;  Location: [facility];  Service: General;  Laterality: Right;  RIGHT MODIFIED RADICAL MASTECTOMY  . Portacath placement  05/10/2012    Procedure: INSERTION PORT-A-CATH;  Surgeon: Merrie Roof, MD;  Location: [facility];  Service: General;  Laterality: Left;  . Decompressive lumbar laminectomy level 4 N/A 02/15/2015    Procedure: DECOMPRESSION T5 AND T3-T7 STABALIZATION;  Surgeon: Consuella Lose, MD;  Location: [facility];  Service: Neurosurgery;  Laterality: N/A;  . Laminectomy N/A 03/27/2015  Procedure: Thoracic Four-Thoracic Six Laminectomy for tumor;  Surgeon: Consuella Lose, MD;  Location: [facility];  Service: Neurosurgery;  Laterality: N/A;  T4-T6 Laminectomy   Past Medical History  Diagnosis Date  . Chronic kidney disease   . Hx: UTI (urinary tract infection)   . Right shoulder pain   . Right knee pain   . Hypertension     recently started Aldactone   . Hyperlipidemia     but not on meds;diet and exercise controlled  . Dizziness     has been going on for 31month;medical MD aware  . H/O hiatal hernia   . GERD (gastroesophageal reflux disease)     Tums prn  . Hemorrhoids   . Urinary frequency     d/t taking Aldactone  . History of UTI   . Anemia     hx of  . Cataract     immature-not sure of which eye  . Anxiety     r/t updated surgery  . Insomnia     takes Melatoniin  daily  . Breast cancer ([facility]     right  . Skin cancer   . Hx of radiation therapy 10/25/12- 12/11/12    right chest wall/regional lymph nodes 5040 cGy, 28 sessions, right chest wall boost 1000 cGy 1 session  . H. pylori infection   . Metastasis to spinal column (HCC)     T5  . Complication of anesthesia     pt states she is sensitive to meds  . PONV (postoperative nausea and vomiting)     pt experienced hair loss, confusion and combative- 02/15/15   BP 127/81 mmHg  Pulse 85  Resp 14  SpO2 99%  Opioid Risk Score:   Fall Risk Score:  `1  Depression screen PHQ 2/9  [facility] 06/01/2015 05/08/2015 04/07/2015  Decreased Interest 1 0 1  Down, Depressed, Hopeless 1 0 1  PHQ - 2 Score 2 0 2  Altered sleeping - - 0  Tired, decreased energy - - 1  Change in appetite - - 0  Feeling bad or failure about yourself  - - 1  Trouble concentrating - - 1  Moving slowly or fidgety/restless - - 0  Suicidal thoughts - - 0  PHQ-9 Score - - 5  Difficult doing work/chores - - Not difficult at all      Review of Systems  Constitutional:       Loss of taste or smell  All other systems reviewed and are negative.      Objective:   Physical Exam  Constitutional: She appears well-developed and well-nourished. No distress.  HENT: dentition intact.  Head: Normocephalic and atraumatic.  Mouth/Throat: Oropharynx is clear and moist. No obvious lesions  Eyes: Conjunctivae and EOM are normal. Conj WNL  Neck: Normal range of motion. Neck supple.  Cardiovascular: Normal rate and regular rhythm.  No murmur heard.  Respiratory: Effort normal and breath sounds normal.  GI: Soft. Bowel sounds are normal. She exhibits no distension. There is no tenderness.  Musculoskeletal: She exhibits no tenderness. She has her solid AFO's with patellar support with her today.  Neurological: She is alert and oriented. Strength UE grossly 4+/5 with inhibition proximally due to pain. LLE-- hip flexion 3  to 3+/5, KE and KF 3+/5 ankle dorsiflexion 3+/5, plantar flexion 4+/5, RLE--- 2+/5HF, 2+KE, 2/+5 ADF/APF  Sensation remains decreased below T5-6, No resting tone. DTR's 3+ bilateral LE's.  Skin: Skin is warm and dry. No rash noted.  No erythema. Surgical incision closed/dry Psych: anxiety    Assessment & Plan:   Medical Problem List and Plan:  1. Functional deficits secondary to T5 cord compression/myelopathy from pathologic compression fracture, metastatic breast cancer. Repeat decompression 03/22/15  -continue with outpatient therapies. She's making great progress.  -reviewed safety and realistic expectations -continue current AFO for now which is designed for support 2. DVT Prophylaxis/Anticoagulation: eliquis  3. Pain Management: . Continue tramadol prn, robaxin prn.  -continue gabapentin '600mg'$  TID for dysesthesias  -heating pad to back  -discussed pacing, rest breaks/ posture.  4. Onc:  -ongoing plan per onc/rad-onc teams  5. Neurogenic bowel and bladder: emptying bowels.  -continue a timed schedule with bladder emptying and a qam bowel program    15 minutes of face to face patient care time were spent during this visit. All questions were encouraged and answered. Follow up in 2 months.

## 2015-07-29 NOTE — Patient Instructions (Signed)
PACE YOURSELF THROUGHOUT THE DAY AS IT PERTAINS TO YOUR BACK. TAKE REST BREAKS IF NEEDED USE HEAT FOR YOUR MID BACK PAIN. ALSO DON'T FORGET YOUR ROBAXIN   PLEASE CALL ME WITH ANY PROBLEMS OR QUESTIONS (#[phone]).

## 2015-07-29 NOTE — Therapy (Signed)
[facility] [address] [place], [place], [address] Phone: [phone]   Fax:  [phone]  Physical Therapy Treatment  Patient Details  Name: KEYARA ENT MRN: [id number] Date of Birth: [birth year] Referring Provider: Alger Simons, MD  Encounter Date: 07/29/2015      PT End of Session - 07/29/15 0944    Visit Number 14   Number of Visits 24   Date for PT Re-Evaluation 08/21/15   PT Start Time 0900   PT Stop Time 0940   PT Time Calculation (min) 40 min   Equipment Utilized During Treatment Gait belt   Activity Tolerance Patient tolerated treatment well   Behavior During Therapy [facility] for tasks assessed/performed      Past Medical History  Diagnosis Date  . Chronic kidney disease   . Hx: UTI (urinary tract infection)   . Right shoulder pain   . Right knee pain   . Hypertension     recently started Aldactone   . Hyperlipidemia     but not on meds;diet and exercise controlled  . Dizziness     has been going on for 74month;medical MD aware  . H/O hiatal hernia   . GERD (gastroesophageal reflux disease)     Tums prn  . Hemorrhoids   . Urinary frequency     d/t taking Aldactone  . History of UTI   . Anemia     hx of  . Cataract     immature-not sure of which eye  . Anxiety     r/t updated surgery  . Insomnia     takes Melatoniin daily  . Breast cancer ([facility]     right  . Skin cancer   . Hx of radiation therapy 10/25/12- 12/11/12    right chest wall/regional lymph nodes 5040 cGy, 28 sessions, right chest wall boost 1000 cGy 1 session  . H. pylori infection   . Metastasis to spinal column (HCC)     T5  . Complication of anesthesia     pt states she is sensitive to meds  . PONV (postoperative nausea and vomiting)     pt experienced hair loss, confusion and combative- 02/15/15    Past Surgical History  Procedure Laterality Date  . Cyst removed from left breast  1970  . Left wrist surgery   2004     with plate  . Colonoscopy    . Esophagogastroduodenoscopy    . Mastectomy, radical    . Mastectomy modified radical  05/10/2012    Procedure: MASTECTOMY MODIFIED RADICAL;  Surgeon: PMerrie Roof MD;  [place] [facility]  Service: General;  Laterality: Right;  RIGHT MODIFIED RADICAL MASTECTOMY  . Portacath placement  05/10/2012    Procedure: INSERTION PORT-A-CATH;  Surgeon: PMerrie Roof MD;  [place] [facility]  Service: General;  Laterality: Left;  . Decompressive lumbar laminectomy level 4 N/A 02/15/2015    Procedure: DECOMPRESSION T5 AND T3-T7 STABALIZATION;  Surgeon: NConsuella Lose MD;  [place] [facility];  Service: Neurosurgery;  Laterality: N/A;  . Laminectomy N/A 03/27/2015    Procedure: Thoracic Four-Thoracic Six Laminectomy for tumor;  Surgeon: NConsuella Lose MD;  [facility];  Service: Neurosurgery;  Laterality: N/A;  T4-T6 Laminectomy    There were no vitals filed for this visit.  Visit Diagnosis:  Decreased strength, endurance, and mobility  Incomplete spinal cord lesion at T1-T6 level without bone injury, initial encounter ([facility]  Paraplegia at T4 level ([facility] [place]  Abnormality of gait  Weakness of both lower extremities      Subjective Assessment - 07/29/15 0902    Subjective Worked on walking HEP in den and hallway and other rooms in the house with supervision from husband.   Currently in Pain? No/denies                         [facility] Adult PT Treatment/Exercise - 07/29/15 0001    Ambulation/Gait   Ambulation/Gait Yes   Ambulation/Gait Assistance 4: Min guard   Ambulation Distance (Feet) 120 Feet  + 70 feet   Assistive device Rolling walker  With BIL AFOs   Gait Pattern Step-through pattern   Ambulation Surface Level   Gait Comments --  cues for controlled steplength   Knee/Hip Exercises: Aerobic   Nustep L=7 to L=6 10 Min   Knee/Hip Exercises: Machines for Strengthening   Total Gym Leg Press Bil 60#, 70#; LLE 35#; R 35#   Manual facilitation controlling knees in extension                PT Education - 07/29/15 0942    Education provided Yes   Education Details Reviewed muscles being strengthened with exercise.   Person(s) Educated Patient   Methods Explanation;Tactile cues;Verbal cues   Comprehension Verbalized understanding          PT Short Term Goals - 07/24/15 0953    PT SHORT TERM GOAL #1   Title independent with initial HEP (07/24/15)   Status Achieved   PT SHORT TERM GOAL #2   Title ambulate > 48' with RW with min A for improved mobility and household ambulation    Status Achieved   PT SHORT TERM GOAL #3   Title perform sit to/from stand transfers with supervision for improved independence    Status Achieved   PT SHORT TERM GOAL #4   Title improve gait velocity to > 0.5 ft/sec for improved function and mobility   Status Achieved           PT Long Term Goals - 06/29/15 1041    PT LONG TERM GOAL #1   Title ambulate > 100' with supervision with RW for improved mobility (08/21/15)   Status New   PT LONG TERM GOAL #2   Title improve gait velocity to > 1.0 ft/sec for improved function and mobility    Status New   PT LONG TERM GOAL #3   Title stand unsupported x 5 min with supervision for improved lower extremity strength and function    Status New   PT LONG TERM GOAL #4   Title perform stand pivot transfers with supervision for improved function and independence   Status New               Plan - 07/29/15 0945    Clinical Impression Statement Skilled session focused on Bil LE strengthening, gait HEP review and training working on mechanics and endurance.  Pt in making gains with functional gait at home.   Pt will benefit from skilled therapeutic intervention in order to improve on the following deficits Abnormal gait;Difficulty walking;Pain;Impaired sensation;Decreased strength;Decreased mobility;Decreased balance   Rehab Potential Good   PT Frequency 3x / week    PT Duration 8 weeks   PT Treatment/Interventions ADLs/Self Care Home Management;Electrical Stimulation;Therapeutic exercise;Therapeutic activities;Functional mobility training;Stair training;Gait training;DME Instruction;Balance training;Neuromuscular re-education;Patient/family education;Orthotic Fit/Training   PT Next Visit Plan standing in parallel bars, gait (ramp negotiation) and standing tolerance; LE strengthening  possibly using Leg press if appropriate per pt request; core/hip strengthening   Consulted and Agree with Plan of Care Patient        Problem List Patient Active Problem List   Diagnosis Date Noted  . Anemia of chronic disease 05/21/2015  . Metastatic cancer to spine ([place]) 03/27/2015  . Thrombocytopenia ([place]) 03/12/2015  . Prerenal azotemia 03/12/2015  . Pain   . Recurrent UTI--due to Klebsiella   . Paraplegia at T4 level ([place]) 02/20/2015  . Neurogenic bowel 02/20/2015  . Neurogenic bladder 02/20/2015  . Postoperative anemia due to acute blood loss 02/17/2015  . Spinal cord compression due to malignant neoplasm metastatic to spine ([place]) 02/15/2015  . Epidural mass 02/15/2015  . Thoracic spine tumor   . Pathologic fracture of vertebra 02/14/2015  . Pathologic fracture of thoracic vertebrae 02/14/2015  . Breast cancer metastasized to bone ([place]) 02/14/2015  . Hyperlipidemia 02/14/2015  . H. pylori infection   . HTN (hypertension) 04/17/2012  . Primary cancer of upper outer quadrant of right female breast ([place]) 03/08/2012    Bjorn Loser, PTA  07/29/2015, 9:49 AM [facility] [place] [address] [place], [place], [address] Phone: [phone]   Fax:  [phone]  Name: Heather [name] MRN: [id number] Date of Birth: 1945-07-17

## 2015-07-31 ENCOUNTER — Ambulatory Visit: Payer: Medicare Other | Admitting: Physical Therapy

## 2015-07-31 DIAGNOSIS — R6889 Other general symptoms and signs: Secondary | ICD-10-CM

## 2015-07-31 DIAGNOSIS — R29898 Other symptoms and signs involving the musculoskeletal system: Secondary | ICD-10-CM

## 2015-07-31 DIAGNOSIS — Z7409 Other reduced mobility: Secondary | ICD-10-CM | POA: Diagnosis not present

## 2015-07-31 DIAGNOSIS — R52 Pain, unspecified: Secondary | ICD-10-CM | POA: Diagnosis not present

## 2015-07-31 DIAGNOSIS — G8220 Paraplegia, unspecified: Secondary | ICD-10-CM

## 2015-07-31 DIAGNOSIS — R531 Weakness: Secondary | ICD-10-CM

## 2015-07-31 DIAGNOSIS — S24151A Other incomplete lesion at T1 level of thoracic spinal cord, initial encounter: Secondary | ICD-10-CM

## 2015-07-31 DIAGNOSIS — M6289 Other specified disorders of muscle: Secondary | ICD-10-CM | POA: Diagnosis not present

## 2015-07-31 DIAGNOSIS — R269 Unspecified abnormalities of gait and mobility: Secondary | ICD-10-CM

## 2015-07-31 NOTE — Therapy (Signed)
[facility] [address] [place], [place], [address] Phone: [phone]   Fax:  [phone]  Physical Therapy Treatment  Patient Details  Name: Heather [name] MRN: [id number] Date of Birth: 1945-12-29 Referring Provider: Alger Simons, MD  Encounter Date: 07/31/2015      PT End of Session - 07/31/15 1010    Visit Number 15   Number of Visits 24   Date for PT Re-Evaluation 08/21/15   PT Start Time 0930   PT Stop Time 4403   PT Time Calculation (min) 44 min   Equipment Utilized During Treatment Gait belt   Activity Tolerance Patient tolerated treatment well   Behavior During Therapy [facility] for tasks assessed/performed      Past Medical History  Diagnosis Date  . Chronic kidney disease   . Hx: UTI (urinary tract infection)   . Right shoulder pain   . Right knee pain   . Hypertension     recently started Aldactone   . Hyperlipidemia     but not on meds;diet and exercise controlled  . Dizziness     has been going on for 38month;medical MD aware  . H/O hiatal hernia   . GERD (gastroesophageal reflux disease)     Tums prn  . Hemorrhoids   . Urinary frequency     d/t taking Aldactone  . History of UTI   . Anemia     hx of  . Cataract     immature-not sure of which eye  . Anxiety     r/t updated surgery  . Insomnia     takes Melatoniin daily  . Breast cancer ([facility]     right  . Skin cancer   . Hx of radiation therapy 10/25/12- 12/11/12    right chest wall/regional lymph nodes 5040 cGy, 28 sessions, right chest wall boost 1000 cGy 1 session  . H. pylori infection   . Metastasis to spinal column (HCC)     T5  . Complication of anesthesia     pt states she is sensitive to meds  . PONV (postoperative nausea and vomiting)     pt experienced hair loss, confusion and combative- 02/15/15    Past Surgical History  Procedure Laterality Date  . Cyst removed from left breast  1970  . Left wrist surgery   2004   with plate  . Colonoscopy    . Esophagogastroduodenoscopy    . Mastectomy, radical    . Mastectomy modified radical  05/10/2012    Procedure: MASTECTOMY MODIFIED RADICAL;  Surgeon: PMerrie Roof MD;  [facility] [facility]  Service: General;  Laterality: Right;  RIGHT MODIFIED RADICAL MASTECTOMY  . Portacath placement  05/10/2012    Procedure: INSERTION PORT-A-CATH;  Surgeon: PMerrie Roof MD;  [place] [facility]  Service: General;  Laterality: Left;  . Decompressive lumbar laminectomy level 4 N/A 02/15/2015    Procedure: DECOMPRESSION T5 AND T3-T7 STABALIZATION;  Surgeon: NConsuella Lose MD;  [facility];  Service: Neurosurgery;  Laterality: N/A;  . Laminectomy N/A 03/27/2015    Procedure: Thoracic Four-Thoracic Six Laminectomy for tumor;  Surgeon: NConsuella Lose MD;  [facility];  Service: Neurosurgery;  Laterality: N/A;  T4-T6 Laminectomy    There were no vitals filed for this visit.  Visit Diagnosis:  Decreased strength, endurance, and mobility  Paraplegia at T4 level ([facility]  Incomplete spinal cord lesion at T1-T6 level without bone injury, initial encounter ([facility]  Abnormality of  gait  Weakness of both lower extremities  Postural fatigue  Pain      Subjective Assessment - 07/31/15 0934    Subjective Sore from working core with OT on Wednesday.  Got new shoes yesterday "they feel like boats."  Feet are burning today.   Patient Stated Goals improve mobility; walk   Currently in Pain? Yes   Pain Score 4    Pain Location Thoracic   Pain Orientation Right;Left   Pain Descriptors / Indicators Aching   Pain Type Chronic pain   Pain Radiating Towards across the back   Pain Onset More than a month ago   Pain Frequency Intermittent   Aggravating Factors  activity   Pain Relieving Factors sitting, rest                         [facility] Adult PT Treatment/Exercise - 07/31/15 0959    Ambulation/Gait   Ramp 1: +2 Total assist;Patient  percentage (comment)  pt 60%   Ramp Details (indicate cue type and reason) mod cues for sequencing and wt shifting with RW   Curb 1: +2 Total assist;Patient percentage (comment)  pt 60%   Curb Details (indicate cue type and reason) cues for sequencing and technique with RW   Knee/Hip Exercises: Aerobic   Nustep L 6 x 8 min   Knee/Hip Exercises: Seated   Long Arc Quad Both;20 reps;Weights   Long Arc Quad Weight 2 lbs.   Long [employer] Limitations 5-10 sec lowering for eccentric control   Knee/Hip Exercises: Supine   Short Arc Quad Sets Both;10 reps   Short Arc Quad Sets Limitations 2# with 5-10 sec eccentric lowering                  PT Short Term Goals - 07/24/15 0953    PT SHORT TERM GOAL #1   Title independent with initial HEP (07/24/15)   Status Achieved   PT SHORT TERM GOAL #2   Title ambulate > 52' with RW with min A for improved mobility and household ambulation    Status Achieved   PT SHORT TERM GOAL #3   Title perform sit to/from stand transfers with supervision for improved independence    Status Achieved   PT SHORT TERM GOAL #4   Title improve gait velocity to > 0.5 ft/sec for improved function and mobility   Status Achieved           PT Long Term Goals - 06/29/15 1041    PT LONG TERM GOAL #1   Title ambulate > 100' with supervision with RW for improved mobility (08/21/15)   Status New   PT LONG TERM GOAL #2   Title improve gait velocity to > 1.0 ft/sec for improved function and mobility    Status New   PT LONG TERM GOAL #3   Title stand unsupported x 5 min with supervision for improved lower extremity strength and function    Status New   PT LONG TERM GOAL #4   Title perform stand pivot transfers with supervision for improved function and independence   Status New               Plan - 07/31/15 1010    Clinical Impression Statement Pt needed +2 tot A for ramp/curb negotiation with pt performing 60% of activity.  Continues to progress well  with PT.   PT Next Visit Plan hamstring strengthening; standing in parallel bars, gait (ramp negotiation)  and standing tolerance; LE strengthening possibly using Leg press if appropriate per pt request; core/hip strengthening   Consulted and Agree with Plan of Care Patient        Problem List Patient Active Problem List   Diagnosis Date Noted  . Anemia of chronic disease 05/21/2015  . Metastatic cancer to spine ([place]) 03/27/2015  . Thrombocytopenia ([place]) 03/12/2015  . Prerenal azotemia 03/12/2015  . Pain   . Recurrent UTI--due to Klebsiella   . Paraplegia at T4 level ([place]) 02/20/2015  . Neurogenic bowel 02/20/2015  . Neurogenic bladder 02/20/2015  . Postoperative anemia due to acute blood loss 02/17/2015  . Spinal cord compression due to malignant neoplasm metastatic to spine ([place]) 02/15/2015  . Epidural mass 02/15/2015  . Thoracic spine tumor   . Pathologic fracture of vertebra 02/14/2015  . Pathologic fracture of thoracic vertebrae 02/14/2015  . Breast cancer metastasized to bone ([place]) 02/14/2015  . Hyperlipidemia 02/14/2015  . H. pylori infection   . HTN (hypertension) 04/17/2012  . Primary cancer of upper outer quadrant of right female breast ([place]) 03/08/2012   Laureen Abrahams, PT, DPT 07/31/2015 10:15 AM  [facility] [address] [place], [place], [address] Phone: [phone]   Fax:  [phone]  Name: Heather [name] MRN: [id number] Date of Birth: 1945-12-30

## 2015-08-03 ENCOUNTER — Ambulatory Visit: Payer: Medicare Other | Admitting: Physical Therapy

## 2015-08-03 ENCOUNTER — Ambulatory Visit: Payer: Medicare Other | Admitting: Occupational Therapy

## 2015-08-03 ENCOUNTER — Encounter: Payer: Self-pay | Admitting: Occupational Therapy

## 2015-08-03 DIAGNOSIS — Z7409 Other reduced mobility: Secondary | ICD-10-CM | POA: Diagnosis not present

## 2015-08-03 DIAGNOSIS — R6889 Other general symptoms and signs: Secondary | ICD-10-CM

## 2015-08-03 DIAGNOSIS — R29898 Other symptoms and signs involving the musculoskeletal system: Secondary | ICD-10-CM

## 2015-08-03 DIAGNOSIS — S24151A Other incomplete lesion at T1 level of thoracic spinal cord, initial encounter: Secondary | ICD-10-CM

## 2015-08-03 DIAGNOSIS — G8220 Paraplegia, unspecified: Secondary | ICD-10-CM

## 2015-08-03 DIAGNOSIS — R52 Pain, unspecified: Secondary | ICD-10-CM | POA: Diagnosis not present

## 2015-08-03 DIAGNOSIS — R531 Weakness: Secondary | ICD-10-CM

## 2015-08-03 DIAGNOSIS — M6289 Other specified disorders of muscle: Secondary | ICD-10-CM

## 2015-08-03 DIAGNOSIS — R269 Unspecified abnormalities of gait and mobility: Secondary | ICD-10-CM | POA: Diagnosis not present

## 2015-08-03 NOTE — Therapy (Signed)
[facility] [address] [place], [place], [address] Phone: [phone]   Fax:  [phone]  Physical Therapy Treatment  Patient Details  Name: Heather [name] MRN: [id number] Date of Birth: [birth year] Referring Provider: Alger Simons, MD  Encounter Date: 08/03/2015      PT End of Session - 08/03/15 1205    Visit Number 16   Number of Visits 24   Date for PT Re-Evaluation 08/21/15   PT Start Time 1101   PT Stop Time 1142   PT Time Calculation (min) 41 min   Equipment Utilized During Treatment Gait belt   Activity Tolerance Patient tolerated treatment well   Behavior During Therapy [name] for tasks assessed/performed      Past Medical History  Diagnosis Date  . Chronic kidney disease   . Hx: UTI (urinary tract infection)   . Right shoulder pain   . Right knee pain   . Hypertension     recently started Aldactone   . Hyperlipidemia     but not on meds;diet and exercise controlled  . Dizziness     has been going on for 37month;medical MD aware  . H/O hiatal hernia   . GERD (gastroesophageal reflux disease)     Tums prn  . Hemorrhoids   . Urinary frequency     d/t taking Aldactone  . History of UTI   . Anemia     hx of  . Cataract     immature-not sure of which eye  . Anxiety     r/t updated surgery  . Insomnia     takes Melatoniin daily  . Breast cancer ([facility]     right  . Skin cancer   . Hx of radiation therapy 10/25/12- 12/11/12    right chest wall/regional lymph nodes 5040 cGy, 28 sessions, right chest wall boost 1000 cGy 1 session  . H. pylori infection   . Metastasis to spinal column (HCC)     T5  . Complication of anesthesia     pt states she is sensitive to meds  . PONV (postoperative nausea and vomiting)     pt experienced hair loss, confusion and combative- 02/15/15    Past Surgical History  Procedure Laterality Date  . Cyst removed from left breast  1970  . Left wrist surgery   2004   with plate  . Colonoscopy    . Esophagogastroduodenoscopy    . Mastectomy, radical    . Mastectomy modified radical  05/10/2012    Procedure: MASTECTOMY MODIFIED RADICAL;  Surgeon: PMerrie Roof MD;  [facility] [facility]  Service: General;  Laterality: Right;  RIGHT MODIFIED RADICAL MASTECTOMY  . Portacath placement  05/10/2012    Procedure: INSERTION PORT-A-CATH;  Surgeon: PMerrie Roof MD;  [place] [facility]  Service: General;  Laterality: Left;  . Decompressive lumbar laminectomy level 4 N/A 02/15/2015    Procedure: DECOMPRESSION T5 AND T3-T7 STABALIZATION;  Surgeon: NConsuella Lose MD;  [facility];  Service: Neurosurgery;  Laterality: N/A;  . Laminectomy N/A 03/27/2015    Procedure: Thoracic Four-Thoracic Six Laminectomy for tumor;  Surgeon: NConsuella Lose MD;  [facility];  Service: Neurosurgery;  Laterality: N/A;  T4-T6 Laminectomy    There were no vitals filed for this visit.  Visit Diagnosis:  Pain  Decreased strength, endurance, and mobility  Paraplegia at T4 level ([facility]  Incomplete spinal cord lesion at T1-T6 level without bone injury, initial encounter ([place]  Abnormality of gait  Weakness of both lower extremities  Postural fatigue      Subjective Assessment - 08/03/15 1103    Subjective doing well; feels she is doing better.  weight shifting is getting easier.   Patient Stated Goals improve mobility; walk   Currently in Pain? Yes   Pain Score 3    Pain Location Back   Pain Orientation Upper   Pain Descriptors / Indicators Aching   Pain Type Chronic pain   Pain Radiating Towards across the back   Pain Onset More than a month ago   Pain Frequency Intermittent   Aggravating Factors  activity   Pain Relieving Factors sitting, rest                         [facility] Adult PT Treatment/Exercise - 08/03/15 1127    Ambulation/Gait   Ambulation/Gait Yes   Ambulation/Gait Assistance 4: Min guard;4: Min assist    Ambulation/Gait Assistance Details one episode of min A due to LOB to L   Ambulation Distance (Feet) 120 Feet   Assistive device Rolling walker   Gait Pattern Step-through pattern   Ambulation Surface Level;Indoor   Ramp 1: +2 Total assist;Patient percentage (comment)  pt 80%   Ramp Details (indicate cue type and reason) improved sequencing and technique; used step to pattern   Curb 1: +2 Total assist;Patient percentage (comment)  pt 80%   Curb Details (indicate cue type and reason) went forward up curb today; demonstrated safe technique   Knee/Hip Exercises: Seated   Long Arc Quad Both;Weights;10 reps   Long Arc Quad Weight 2 lbs.   Long [employer] Limitations 5-10 sec lowering for eccentric control   Knee/Hip Exercises: Prone   Hamstring Curl 1 set;10 reps   Hamstring Curl Limitations bil with AA; focus on eccentric lowering   Hip Extension Both;AAROM;10 reps   Hip Extension Limitations with knee flexion                  PT Short Term Goals - 07/24/15 0017    PT SHORT TERM GOAL #1   Title independent with initial HEP (07/24/15)   Status Achieved   PT SHORT TERM GOAL #2   Title ambulate > 47' with RW with min A for improved mobility and household ambulation    Status Achieved   PT SHORT TERM GOAL #3   Title perform sit to/from stand transfers with supervision for improved independence    Status Achieved   PT SHORT TERM GOAL #4   Title improve gait velocity to > 0.5 ft/sec for improved function and mobility   Status Achieved           PT Long Term Goals - 06/29/15 1041    PT LONG TERM GOAL #1   Title ambulate > 100' with supervision with RW for improved mobility (08/21/15)   Status New   PT LONG TERM GOAL #2   Title improve gait velocity to > 1.0 ft/sec for improved function and mobility    Status New   PT LONG TERM GOAL #3   Title stand unsupported x 5 min with supervision for improved lower extremity strength and function    Status New   PT LONG TERM GOAL  #4   Title perform stand pivot transfers with supervision for improved function and independence   Status New               Plan - 08/03/15 1205  Clinical Impression Statement Pt making progress with ramp and curb needing less assistance today.  Continues to have poor eccentric control with quads and hamstrings.   PT Next Visit Plan hamstring strengthening; standing in parallel bars, gait (ramp negotiation) and standing tolerance; LE strengthening possibly using Leg press if appropriate per pt request; core/hip strengthening   Consulted and Agree with Plan of Care Patient        Problem List Patient Active Problem List   Diagnosis Date Noted  . Anemia of chronic disease 05/21/2015  . Metastatic cancer to spine ([place]) 03/27/2015  . Thrombocytopenia ([place]) 03/12/2015  . Prerenal azotemia 03/12/2015  . Pain   . Recurrent UTI--due to Klebsiella   . Paraplegia at T4 level ([place]) 02/20/2015  . Neurogenic bowel 02/20/2015  . Neurogenic bladder 02/20/2015  . Postoperative anemia due to acute blood loss 02/17/2015  . Spinal cord compression due to malignant neoplasm metastatic to spine ([place]) 02/15/2015  . Epidural mass 02/15/2015  . Thoracic spine tumor   . Pathologic fracture of vertebra 02/14/2015  . Pathologic fracture of thoracic vertebrae 02/14/2015  . Breast cancer metastasized to bone ([place]) 02/14/2015  . Hyperlipidemia 02/14/2015  . H. pylori infection   . HTN (hypertension) 04/17/2012  . Primary cancer of upper outer quadrant of right female breast ([place]) 03/08/2012   Laureen Abrahams, PT, DPT 08/03/2015 12:07 PM  [facility] [address] [place], [place], [address] Phone: [phone]   Fax:  [phone]  Name: HOUDA BRAU MRN: [id number] Date of Birth: December 13, 1945

## 2015-08-03 NOTE — Therapy (Signed)
[facility] [address] [place], [place], [address] Phone: [phone]   Fax:  [phone]  Occupational Therapy Treatment  Patient Details  Name: Heather [name] MRN: [id number] Date of Birth: April 21, 1946 Referring Provider: Dr. Naaman Plummer   Encounter Date: 08/03/2015      OT End of Session - 08/03/15 1124    Visit Number 5   Number of Visits 17   Date for OT Re-Evaluation 09/04/15   Authorization Type medicare - g code needed every 10 visits   Authorization - Visit Number 5   Authorization - Number of Visits 10   OT Start Time 1017   OT Stop Time 1100   OT Time Calculation (min) 43 min   Activity Tolerance Patient tolerated treatment well   Behavior During Therapy [facility] for tasks assessed/performed      Past Medical History  Diagnosis Date  . Chronic kidney disease   . Hx: UTI (urinary tract infection)   . Right shoulder pain   . Right knee pain   . Hypertension     recently started Aldactone   . Hyperlipidemia     but not on meds;diet and exercise controlled  . Dizziness     has been going on for 35month;medical MD aware  . H/O hiatal hernia   . GERD (gastroesophageal reflux disease)     Tums prn  . Hemorrhoids   . Urinary frequency     d/t taking Aldactone  . History of UTI   . Anemia     hx of  . Cataract     immature-not sure of which eye  . Anxiety     r/t updated surgery  . Insomnia     takes Melatoniin daily  . [place]     [place]  . Skin cancer   . Hx of radiation therapy 10/25/12- 12/11/12    right chest wall/regional lymph nodes 5040 cGy, 28 sessions, right chest wall boost 1000 cGy 1 session  . H. pylori infection   . Metastasis to spinal column (HCC)     T5  . Complication of anesthesia     pt states she is sensitive to meds  . PONV (postoperative nausea and vomiting)     pt experienced hair loss, confusion and combative- 02/15/15    Past Surgical History  Procedure  Laterality Date  . Cyst removed from left breast  1970  . Left wrist surgery   2004    with plate  . Colonoscopy    . Esophagogastroduodenoscopy    . Mastectomy, radical    . Mastectomy modified radical  05/10/2012    Procedure: MASTECTOMY MODIFIED RADICAL;  Surgeon: PMerrie Roof MD;  [facility]  Service: General;  Laterality: Right;  RIGHT MODIFIED RADICAL MASTECTOMY  . Portacath placement  05/10/2012    Procedure: INSERTION PORT-A-CATH;  Surgeon: PMerrie Roof MD;  [facility]  Service: General;  Laterality: Left;  . Decompressive lumbar laminectomy level 4 N/A 02/15/2015    Procedure: DECOMPRESSION T5 AND T3-T7 STABALIZATION;  Surgeon: NConsuella Lose MD;  [facility];  Service: Neurosurgery;  Laterality: N/A;  . Laminectomy N/A 03/27/2015    Procedure: Thoracic Four-Thoracic Six Laminectomy for tumor;  Surgeon: NConsuella Lose MD;  [facility];  Service: Neurosurgery;  Laterality: N/A;  T4-T6 Laminectomy    There were no vitals filed for this visit.  Visit Diagnosis:  Pain  Decreased strength, endurance, and mobility  Subjective Assessment - 08/03/15 1109    Subjective  I feel like I am making really great improvements.  I am very motivated to do so much more.     Pertinent History Diagnosed with Breast CA Oct 2013, mastectomy Jan 2014. T5 resection Oct 2016 with consequent incomplete SCI   Limitations NO heat modalities   Patient Stated Goals I want to be able to walk. I would love to be able to go into my kitchen and stand to clean my counters. I want to get off my blood thinner.    Currently in Pain? Yes   Pain Score 3    Pain Location Back   Pain Orientation Upper   Pain Descriptors / Indicators Aching   Pain Type Chronic pain   Aggravating Factors  activity   Pain Relieving Factors rest, reposition                      OT Treatments/Exercises (OP) - 08/03/15 0001    ADLs   Functional Mobility Practiced  dynamic standing activity at countertop with decreased reliance on Upper extremities for balance.  Patient does best initially with UE's in support, but with subsequent attempts at small weight shifts, patient able to perform without UE support.     Neurological Re-education Exercises   Other Exercises 1 Worked on biomecahnics for sit to stand, sit to squat, squat to sit.  Patient with weakness in right hip evident with cueing and tactile assist for proper alignment.  Core musculature weak especially to maintain trunk extension.  Patient needing physical cueing to manage upper trunk over (not behind) hips and base of support.                  OT Education - 08/03/15 1116    Education provided Yes   Education Details alignment for sit to stand and standing   Person(s) Educated Patient   Methods Explanation;Tactile cues;Verbal cues;Demonstration   Comprehension Verbalized understanding;Tactile cues required;Need further instruction          OT Short Term Goals - 08/03/15 1119    OT SHORT TERM GOAL #1   Title Pt will be independent with initial UE & core HEP   Status On-going   OT SHORT TERM GOAL #2   Title Pt will be independent with lateral leans to pull pants up/down for ADLs   Status On-going   OT SHORT TERM GOAL #3   Title Pt will be supervision in standing position with RW for support for pulling pants up/down during dressing and toileting needs   Status Achieved   OT SHORT TERM GOAL #4   Title Pt will be able to stand for at least 10 minutes during ADL task with no rest break   Status Achieved   OT SHORT TERM GOAL #5   Title Pt will complete simple meal prep or cooking activity in sit to/from stand position with supervision using RW prn   Status On-going           OT Long Term Goals - 07/10/15 1446    OT LONG TERM GOAL #1   Title Pt will be independent with upgraded UE & core HEP   Time 8   Period Weeks   Status New   OT LONG TERM GOAL #2   Title Pt will be  mod I with shower stall transfers using shower bench and grab bars prn   Time 8   Period Weeks   Status New  OT LONG TERM GOAL #3   Title Pt will tolerate dynamic standing activity, task, or ADL for at least 20 minutes with only one seated rest break if needed    Time 8   Period Weeks   Status New   OT LONG TERM GOAL #4   Title Pt will be mod I with simple meal prep or cooking activity in sit to/from stand position using RW prn   Time 8   Period Weeks   Status New   OT LONG TERM GOAL #5   Title --   Time --   Period --   Status --               Plan - 08/03/15 1118    Clinical Impression Statement Patient showing improved functional independence with ADL/ IADL due to improved core and proximal strength overall   Pt will benefit from skilled therapeutic intervention in order to improve on the following deficits (Retired) Decreased activity tolerance;Decreased balance;Decreased endurance;Decreased mobility;Decreased skin integrity;Decreased strength;Difficulty walking;Pain;Impaired tone;Improper spinal/pelvic alignment   OT Frequency 2x / week   OT Duration 8 weeks   OT Treatment/Interventions Self-care/ADL training;Therapeutic exercise;Energy conservation;DME and/or AE instruction;Functional Mobility Training;Manual Therapy;Therapeutic exercises;Therapeutic activities;Patient/family education;Balance training;Electrical Stimulation;Passive range of motion   Plan dynamic standing, postural control and strength   Consulted and Agree with Plan of Care Patient        Problem List Patient Active Problem List   Diagnosis Date Noted  . Anemia of chronic disease 05/21/2015  . Metastatic cancer to spine ([place]) 03/27/2015  . Thrombocytopenia ([place]) 03/12/2015  . Prerenal azotemia 03/12/2015  . Pain   . Recurrent UTI--due to Klebsiella   . Paraplegia at T4 level ([place]) 02/20/2015  . Neurogenic bowel 02/20/2015  . Neurogenic bladder 02/20/2015  . Postoperative anemia due to  acute blood loss 02/17/2015  . Spinal cord compression due to malignant neoplasm metastatic to spine ([place]) 02/15/2015  . Epidural mass 02/15/2015  . Thoracic spine tumor   . Pathologic fracture of vertebra 02/14/2015  . Pathologic fracture of thoracic vertebrae 02/14/2015  . Breast cancer metastasized to bone ([place]) 02/14/2015  . Hyperlipidemia 02/14/2015  . H. pylori infection   . HTN (hypertension) 04/17/2012  . Primary cancer of upper outer quadrant of right female breast ([place]) 03/08/2012    Mariah Milling, OTR/L 08/03/2015, 11:25 AM  [facility] [address] [place], [place], [address] Phone: [phone]   Fax:  [phone]  Name: Heather [name] MRN: [id number] Date of Birth: August 21, 1945

## 2015-08-05 ENCOUNTER — Encounter: Payer: Medicare Other | Admitting: Occupational Therapy

## 2015-08-05 ENCOUNTER — Ambulatory Visit: Payer: Medicare Other | Admitting: Physical Therapy

## 2015-08-05 ENCOUNTER — Encounter: Payer: Self-pay | Admitting: Occupational Therapy

## 2015-08-05 ENCOUNTER — Ambulatory Visit: Payer: Medicare Other | Admitting: Occupational Therapy

## 2015-08-05 DIAGNOSIS — M6289 Other specified disorders of muscle: Secondary | ICD-10-CM

## 2015-08-05 DIAGNOSIS — R531 Weakness: Secondary | ICD-10-CM

## 2015-08-05 DIAGNOSIS — R29898 Other symptoms and signs involving the musculoskeletal system: Secondary | ICD-10-CM

## 2015-08-05 DIAGNOSIS — R269 Unspecified abnormalities of gait and mobility: Secondary | ICD-10-CM | POA: Diagnosis not present

## 2015-08-05 DIAGNOSIS — Z7409 Other reduced mobility: Secondary | ICD-10-CM | POA: Diagnosis not present

## 2015-08-05 DIAGNOSIS — R52 Pain, unspecified: Secondary | ICD-10-CM | POA: Diagnosis not present

## 2015-08-05 DIAGNOSIS — R6889 Other general symptoms and signs: Secondary | ICD-10-CM

## 2015-08-05 DIAGNOSIS — S24151A Other incomplete lesion at T1 level of thoracic spinal cord, initial encounter: Secondary | ICD-10-CM | POA: Diagnosis not present

## 2015-08-05 NOTE — Therapy (Signed)
[facility] [address] [place], [place], [address] Phone: [phone]   Fax:  [phone]  Occupational Therapy Treatment  Patient Details  Name: Heather [name] MRN: [id number] Date of Birth: Jun 06, 1945 Referring Provider: Dr. Naaman Plummer   Encounter Date: 08/05/2015      OT End of Session - 08/05/15 0958    Visit Number 6   Number of Visits 17   Date for OT Re-Evaluation 09/04/15   Authorization Type medicare - g code needed every 10 visits   Authorization - Visit Number 6   Authorization - Number of Visits 10   OT Start Time 0849   OT Stop Time 0931   OT Time Calculation (min) 42 min   Activity Tolerance Patient tolerated treatment well   Behavior During Therapy [facility] for tasks assessed/performed      Past Medical History  Diagnosis Date  . Chronic kidney disease   . Hx: UTI (urinary tract infection)   . Right shoulder pain   . Right knee pain   . Hypertension     recently started Aldactone   . Hyperlipidemia     but not on meds;diet and exercise controlled  . Dizziness     has been going on for 43month;medical MD aware  . H/O hiatal hernia   . GERD (gastroesophageal reflux disease)     Tums prn  . Hemorrhoids   . Urinary frequency     d/t taking Aldactone  . History of UTI   . Anemia     hx of  . Cataract     immature-not sure of which eye  . Anxiety     r/t updated surgery  . Insomnia     takes Melatoniin daily  . [place]     right  . Skin cancer   . Hx of radiation therapy 10/25/12- 12/11/12    right chest wall/regional lymph nodes 5040 cGy, 28 sessions, right chest wall boost 1000 cGy 1 session  . H. pylori infection   . Metastasis to spinal column (HCC)     T5  . Complication of anesthesia     pt states she is sensitive to meds  . PONV (postoperative nausea and vomiting)     pt experienced hair loss, confusion and combative- 02/15/15    Past Surgical History  Procedure  Laterality Date  . Cyst removed from left breast  1970  . Left wrist surgery   2004    with plate  . Colonoscopy    . Esophagogastroduodenoscopy    . Mastectomy, radical    . Mastectomy modified radical  05/10/2012    Procedure: MASTECTOMY MODIFIED RADICAL;  Surgeon: PMerrie Roof MD;  [facility]  Service: General;  Laterality: Right;  RIGHT MODIFIED RADICAL MASTECTOMY  . Portacath placement  05/10/2012    Procedure: INSERTION PORT-A-CATH;  Surgeon: PMerrie Roof MD;  [facility]  Service: General;  Laterality: Left;  . Decompressive lumbar laminectomy level 4 N/A 02/15/2015    Procedure: DECOMPRESSION T5 AND T3-T7 STABALIZATION;  Surgeon: NConsuella Lose MD;  [facility];  Service: Neurosurgery;  Laterality: N/A;  . Laminectomy N/A 03/27/2015    Procedure: Thoracic Four-Thoracic Six Laminectomy for tumor;  Surgeon: NConsuella Lose MD;  [facility];  Service: Neurosurgery;  Laterality: N/A;  T4-T6 Laminectomy    There were no vitals filed for this visit.  Visit Diagnosis:  Decreased strength, endurance, and mobility  Postural  fatigue      Subjective Assessment - 08/05/15 0932    Subjective  I really enjoyed working on my balance   Pertinent History Diagnosed with Breast CA Oct 2013, mastectomy Jan 2014. T5 resection Oct 2016 with consequent incomplete SCI   Limitations NO heat modalities   Patient Stated Goals I want to be able to walk. I would love to be able to go into my kitchen and stand to clean my counters. I want to get off my blood thinner.    Currently in Pain? No/denies   Pain Score 0-No pain                      OT Treatments/Exercises (OP) - 08/05/15 7782    Neurological Re-education Exercises   Other Exercises 1 dynamic stand balance addressing alignment and postural control with hands out of support.  Patient with tendency to stand with shoulders behind hips.  Discussed the importance of balancing front and back  aspects of trunk for postural control.  Patient initially required min cueing to align self erect in standing, and after repetition and visual cueing with mirror, abke to correct with just verbal prompt.                  OT Education - 08/05/15 [phone]    Education provided No          OT Short Term Goals - 08/03/15 1119    OT SHORT TERM GOAL #1   Title Pt will be independent with initial UE & core HEP   Status On-going   OT SHORT TERM GOAL #2   Title Pt will be independent with lateral leans to pull pants up/down for ADLs   Status On-going   OT SHORT TERM GOAL #3   Title Pt will be supervision in standing position with RW for support for pulling pants up/down during dressing and toileting needs   Status Achieved   OT SHORT TERM GOAL #4   Title Pt will be able to stand for at least 10 minutes during ADL task with no rest break   Status Achieved   OT SHORT TERM GOAL #5   Title Pt will complete simple meal prep or cooking activity in sit to/from stand position with supervision using RW prn   Status On-going           OT Long Term Goals - 07/10/15 1446    OT LONG TERM GOAL #1   Title Pt will be independent with upgraded UE & core HEP   Time 8   Period Weeks   Status New   OT LONG TERM GOAL #2   Title Pt will be mod I with shower stall transfers using shower bench and grab bars prn   Time 8   Period Weeks   Status New   OT LONG TERM GOAL #3   Title Pt will tolerate dynamic standing activity, task, or ADL for at least 20 minutes with only one seated rest break if needed    Time 8   Period Weeks   Status New   OT LONG TERM GOAL #4   Title Pt will be mod I with simple meal prep or cooking activity in sit to/from stand position using RW prn   Time 8   Period Weeks   Status New   OT LONG TERM GOAL #5   Title --   Time --   Period --   Status --  Plan - 08/05/15 1000    Clinical Impression Statement Patient very motivated for functional  improvement, and showing improved stand balance and stand tolerance with decreased UE support.   Pt will benefit from skilled therapeutic intervention in order to improve on the following deficits (Retired) Decreased activity tolerance;Decreased balance;Decreased endurance;Decreased mobility;Decreased skin integrity;Decreased strength;Difficulty walking;Pain;Impaired tone;Improper spinal/pelvic alignment   Rehab Potential Good   Clinical Impairments Affecting Rehab Potential none known at this time   OT Frequency 2x / week   OT Duration 8 weeks   OT Treatment/Interventions Self-care/ADL training;Therapeutic exercise;Energy conservation;DME and/or AE instruction;Functional Mobility Training;Manual Therapy;Therapeutic exercises;Therapeutic activities;Patient/family education;Balance training;Electrical Stimulation;Passive range of motion   Plan dynamic standing, postural control and strength   OT Home Exercise Plan Encouraged standing at sink with emphasis on alignment of shoulders over feet (not behind)    Consulted and Agree with Plan of Care Patient        Problem List Patient Active Problem List   Diagnosis Date Noted  . Anemia of chronic disease 05/21/2015  . Metastatic cancer to spine ([place]) 03/27/2015  . Thrombocytopenia ([place]) 03/12/2015  . Prerenal azotemia 03/12/2015  . Pain   . Recurrent UTI--due to Klebsiella   . Paraplegia at T4 level ([place]) 02/20/2015  . Neurogenic bowel 02/20/2015  . Neurogenic bladder 02/20/2015  . Postoperative anemia due to acute blood loss 02/17/2015  . Spinal cord compression due to malignant neoplasm metastatic to spine ([place]) 02/15/2015  . Epidural mass 02/15/2015  . Thoracic spine tumor   . Pathologic fracture of vertebra 02/14/2015  . Pathologic fracture of thoracic vertebrae 02/14/2015  . Breast cancer metastasized to bone ([place]) 02/14/2015  . Hyperlipidemia 02/14/2015  . H. pylori infection   . HTN (hypertension) 04/17/2012  . Primary cancer of  upper outer quadrant of right female breast ([place]) 03/08/2012    Mariah Milling, OTR/L 08/05/2015, 10:08 AM  [facility] [address] [place], [place], [address] Phone: [phone]   Fax:  [phone]  Name: Heather [name] MRN: [id number] Date of Birth: 23-Nov-1945

## 2015-08-05 NOTE — Therapy (Signed)
[facility] [address] [place], [place], [address] Phone: [phone]   Fax:  [phone]  Physical Therapy Treatment  Patient Details  Name: Heather [name] MRN: [id number] Date of Birth: [birth year] Referring Provider: Alger Simons, MD  Encounter Date: 08/05/2015      PT End of Session - 08/05/15 0907    Visit Number 17   Number of Visits 24   Date for PT Re-Evaluation 08/21/15   PT Start Time 0805   PT Stop Time 0845   PT Time Calculation (min) 40 min   Equipment Utilized During Treatment Gait belt   Activity Tolerance Patient tolerated treatment well   Behavior During Therapy [facility] for tasks assessed/performed      Past Medical History  Diagnosis Date  . Chronic kidney disease   . Hx: UTI (urinary tract infection)   . Right shoulder pain   . Right knee pain   . Hypertension     recently started Aldactone   . Hyperlipidemia     but not on meds;diet and exercise controlled  . Dizziness     has been going on for 32month;medical MD aware  . H/O hiatal hernia   . GERD (gastroesophageal reflux disease)     Tums prn  . Hemorrhoids   . Urinary frequency     d/t taking Aldactone  . History of UTI   . Anemia     hx of  . Cataract     immature-not sure of which eye  . Anxiety     r/t updated surgery  . Insomnia     takes Melatoniin daily  . Breast cancer ([place]     right  . Skin cancer   . Hx of radiation therapy 10/25/12- 12/11/12    right chest wall/regional lymph nodes 5040 cGy, 28 sessions, right chest wall boost 1000 cGy 1 session  . H. pylori infection   . Metastasis to spinal column (HCC)     T5  . Complication of anesthesia     pt states she is sensitive to meds  . PONV (postoperative nausea and vomiting)     pt experienced hair loss, confusion and combative- 02/15/15    Past Surgical History  Procedure Laterality Date  . Cyst removed from left breast  1970  . Left wrist surgery   2004   with plate  . Colonoscopy    . Esophagogastroduodenoscopy    . Mastectomy, radical    . Mastectomy modified radical  05/10/2012    Procedure: MASTECTOMY MODIFIED RADICAL;  Surgeon: PMerrie Roof MD;  [place]  Service: General;  Laterality: Right;  RIGHT MODIFIED RADICAL MASTECTOMY  . Portacath placement  05/10/2012    Procedure: INSERTION PORT-A-CATH;  Surgeon: PMerrie Roof MD;  [place] [facility]  Service: General;  Laterality: Left;  . Decompressive lumbar laminectomy level 4 N/A 02/15/2015    Procedure: DECOMPRESSION T5 AND T3-T7 STABALIZATION;  Surgeon: NConsuella Lose MD;  [facility];  Service: Neurosurgery;  Laterality: N/A;  . Laminectomy N/A 03/27/2015    Procedure: Thoracic Four-Thoracic Six Laminectomy for tumor;  Surgeon: NConsuella Lose MD;  [facility];  Service: Neurosurgery;  Laterality: N/A;  T4-T6 Laminectomy    There were no vitals filed for this visit.  Visit Diagnosis:  Abnormality of gait  Weakness of both lower extremities      Subjective Assessment - 08/05/15 0859    Subjective Pt feels like she is  slowly getting stronger.  Wants to be more confident walking around her house.   Limitations House hold activities;Standing;Walking   How long can you walk comfortably? first time 2/13: 30-40 ft with RW   Patient Stated Goals improve mobility; walk   Currently in Pain? No/denies   Multiple Pain Sites No                         [facility] Adult PT Treatment/Exercise - 08/05/15 0900    Transfers   Transfers Sit to Stand;Stand to Sit   Sit to Stand 5: Supervision   Stand to Sit 5: Supervision   Ambulation/Gait   Ambulation/Gait Yes   Ambulation/Gait Assistance 4: Min guard   Ambulation/Gait Assistance Details no LOB today   Ambulation Distance (Feet) 240 Feet   Assistive device Rolling walker   Gait Pattern Step-through pattern;Decreased hip/knee flexion - right;Decreased hip/knee flexion - left;Decreased  dorsiflexion - right;Decreased dorsiflexion - left  with bil custom AFO's   Ambulation Surface Level;Indoor   Knee/Hip Exercises: Machines for Strengthening   Total Gym Leg Press bil LE 70# x 20, RLE 35# x 15, LLE 35# x 10, 40# x 10  cues to control terminal knee extension at times   Other Machine pt unable to get optimal position on leg press due to bil AFO's   Knee/Hip Exercises: Standing   Knee Flexion Strengthening;Both;1 set;10 reps   Knee Flexion Limitations in standing with hip in extension for toe taps while standing at RW   Knee/Hip Exercises: Seated   Hamstring Curl Both;1 set;10 reps;Other (comment)   Hamstring Limitations able to progress to green band-provided with green theraband for home use                PT Education - 08/05/15 0906    Education provided Yes   Education Details progress to green theraband for seated hamstring curls at home   Person(s) Educated Patient   Methods Explanation;Demonstration   Comprehension Verbalized understanding          PT Short Term Goals - 07/24/15 0953    PT SHORT TERM GOAL #1   Title independent with initial HEP (07/24/15)   Status Achieved   PT SHORT TERM GOAL #2   Title ambulate > 62' with RW with min A for improved mobility and household ambulation    Status Achieved   PT SHORT TERM GOAL #3   Title perform sit to/from stand transfers with supervision for improved independence    Status Achieved   PT SHORT TERM GOAL #4   Title improve gait velocity to > 0.5 ft/sec for improved function and mobility   Status Achieved           PT Long Term Goals - 06/29/15 1041    PT LONG TERM GOAL #1   Title ambulate > 100' with supervision with RW for improved mobility (08/21/15)   Status New   PT LONG TERM GOAL #2   Title improve gait velocity to > 1.0 ft/sec for improved function and mobility    Status New   PT LONG TERM GOAL #3   Title stand unsupported x 5 min with supervision for improved lower extremity strength  and function    Status New   PT LONG TERM GOAL #4   Title perform stand pivot transfers with supervision for improved function and independence   Status New  Plan - 08/05/15 0907    Clinical Impression Statement Pt able to progress to green theraband with bil hamstring curls.  Continues with poor eccentric control in quads and hamstrings.  Discussed possiblity of trying leg press without AFO's for better positioning.  Continue PT per POC.   Pt will benefit from skilled therapeutic intervention in order to improve on the following deficits Abnormal gait;Difficulty walking;Pain;Impaired sensation;Decreased strength;Decreased mobility;Decreased balance   Rehab Potential Good   PT Frequency 3x / week   PT Duration 8 weeks   PT Treatment/Interventions ADLs/Self Care Home Management;Electrical Stimulation;Therapeutic exercise;Therapeutic activities;Functional mobility training;Stair training;Gait training;DME Instruction;Balance training;Neuromuscular re-education;Patient/family education;Orthotic Fit/Training   PT Next Visit Plan hamstring strengthening; standing in parallel bars, gait (ramp negotiation) and standing tolerance; LE strengthening possibly using Leg press if appropriate per pt request; core/hip strengthening        Problem List Patient Active Problem List   Diagnosis Date Noted  . Anemia of chronic disease 05/21/2015  . Metastatic cancer to spine ([place]) 03/27/2015  . Thrombocytopenia ([place]) 03/12/2015  . Prerenal azotemia 03/12/2015  . Pain   . Recurrent UTI--due to Klebsiella   . Paraplegia at T4 level ([place]) 02/20/2015  . Neurogenic bowel 02/20/2015  . Neurogenic bladder 02/20/2015  . Postoperative anemia due to acute blood loss 02/17/2015  . Spinal cord compression due to malignant neoplasm metastatic to spine ([place]) 02/15/2015  . Epidural mass 02/15/2015  . Thoracic spine tumor   . Pathologic fracture of vertebra 02/14/2015  . Pathologic fracture of  thoracic vertebrae 02/14/2015  . Breast cancer metastasized to bone ([place]) 02/14/2015  . Hyperlipidemia 02/14/2015  . H. pylori infection   . HTN (hypertension) 04/17/2012  . Primary cancer of upper outer quadrant of right female breast ([place]) 03/08/2012    Narda Bonds 08/05/2015, 9:09 AM  [facility] [address] [place], [place], [address] Phone: [phone]   Fax:  [phone]  Name: ASHYA NICOLAISEN MRN: [id number] Date of Birth: Dec 12, 1945    Narda Bonds, [place] 08/05/2015 9:10 AM Phone: [phone] Fax: [phone]

## 2015-08-10 ENCOUNTER — Ambulatory Visit: Payer: Medicare Other | Admitting: Physical Therapy

## 2015-08-10 ENCOUNTER — Encounter: Payer: Medicare Other | Admitting: Occupational Therapy

## 2015-08-11 ENCOUNTER — Ambulatory Visit: Payer: Medicare Other | Attending: Physical Medicine & Rehabilitation | Admitting: Occupational Therapy

## 2015-08-11 ENCOUNTER — Ambulatory Visit: Payer: Medicare Other | Admitting: Physical Therapy

## 2015-08-11 ENCOUNTER — Encounter: Payer: Self-pay | Admitting: Occupational Therapy

## 2015-08-11 DIAGNOSIS — G839 Paralytic syndrome, unspecified: Secondary | ICD-10-CM | POA: Diagnosis not present

## 2015-08-11 DIAGNOSIS — Z7409 Other reduced mobility: Secondary | ICD-10-CM | POA: Insufficient documentation

## 2015-08-11 DIAGNOSIS — R269 Unspecified abnormalities of gait and mobility: Secondary | ICD-10-CM | POA: Insufficient documentation

## 2015-08-11 DIAGNOSIS — R29898 Other symptoms and signs involving the musculoskeletal system: Secondary | ICD-10-CM | POA: Diagnosis not present

## 2015-08-11 DIAGNOSIS — R2681 Unsteadiness on feet: Secondary | ICD-10-CM

## 2015-08-11 DIAGNOSIS — R293 Abnormal posture: Secondary | ICD-10-CM | POA: Diagnosis not present

## 2015-08-11 DIAGNOSIS — R52 Pain, unspecified: Secondary | ICD-10-CM | POA: Diagnosis not present

## 2015-08-11 DIAGNOSIS — R2689 Other abnormalities of gait and mobility: Secondary | ICD-10-CM

## 2015-08-11 DIAGNOSIS — M6281 Muscle weakness (generalized): Secondary | ICD-10-CM | POA: Diagnosis not present

## 2015-08-11 DIAGNOSIS — M6289 Other specified disorders of muscle: Secondary | ICD-10-CM | POA: Diagnosis not present

## 2015-08-11 DIAGNOSIS — S24151A Other incomplete lesion at T1 level of thoracic spinal cord, initial encounter: Secondary | ICD-10-CM | POA: Diagnosis not present

## 2015-08-11 DIAGNOSIS — X58XXXA Exposure to other specified factors, initial encounter: Secondary | ICD-10-CM | POA: Insufficient documentation

## 2015-08-11 NOTE — Therapy (Signed)
[facility] [address] [place], [place], [address] Phone: [phone]   Fax:  [phone]  Occupational Therapy Treatment  Patient Details  Name: Heather [name] MRN: [id number] Date of Birth: Jul 20, 1945 Referring Provider: Dr. Naaman Plummer   Encounter Date: 08/11/2015      OT End of Session - 08/11/15 1413    Visit Number 7   Number of Visits 17   Date for OT Re-Evaluation 09/04/15   Authorization Type medicare - g code needed every 10 visits   Authorization - Visit Number 7   Authorization - Number of Visits 10   OT Start Time 1315   OT Stop Time 1400   OT Time Calculation (min) 45 min   Activity Tolerance Patient tolerated treatment well      Past Medical History  Diagnosis Date  . Chronic kidney disease   . Hx: UTI (urinary tract infection)   . Right shoulder pain   . Right knee pain   . Hypertension     recently started Aldactone   . Hyperlipidemia     but not on meds;diet and exercise controlled  . Dizziness     has been going on for 10month;medical MD aware  . H/O hiatal hernia   . GERD (gastroesophageal reflux disease)     Tums prn  . Hemorrhoids   . Urinary frequency     d/t taking Aldactone  . History of UTI   . Anemia     hx of  . Cataract     immature-not sure of which eye  . Anxiety     r/t updated surgery  . Insomnia     takes Melatoniin daily  . Breast cancer ([place]     right  . Skin cancer   . Hx of radiation therapy 10/25/12- 12/11/12    right chest wall/regional lymph nodes 5040 cGy, 28 sessions, right chest wall boost 1000 cGy 1 session  . H. pylori infection   . Metastasis to spinal column (HCC)     T5  . Complication of anesthesia     pt states she is sensitive to meds  . PONV (postoperative nausea and vomiting)     pt experienced hair loss, confusion and combative- 02/15/15    Past Surgical History  Procedure Laterality Date  . Cyst removed from left breast  1970  . Left  wrist surgery   2004    with plate  . Colonoscopy    . Esophagogastroduodenoscopy    . Mastectomy, radical    . Mastectomy modified radical  05/10/2012    Procedure: MASTECTOMY MODIFIED RADICAL;  Surgeon: PMerrie Roof MD;  [place] [facility]  Service: General;  Laterality: Right;  RIGHT MODIFIED RADICAL MASTECTOMY  . Portacath placement  05/10/2012    Procedure: INSERTION PORT-A-CATH;  Surgeon: PMerrie Roof MD;  [place]  Service: General;  Laterality: Left;  . Decompressive lumbar laminectomy level 4 N/A 02/15/2015    Procedure: DECOMPRESSION T5 AND T3-T7 STABALIZATION;  Surgeon: NConsuella Lose MD;  [facility];  Service: Neurosurgery;  Laterality: N/A;  . Laminectomy N/A 03/27/2015    Procedure: Thoracic Four-Thoracic Six Laminectomy for tumor;  Surgeon: NConsuella Lose MD;  [facility];  Service: Neurosurgery;  Laterality: N/A;  T4-T6 Laminectomy    There were no vitals filed for this visit.  Visit Diagnosis:  Unsteadiness on feet  Muscle weakness (generalized)  Abnormal posture      Subjective  Assessment - 08/11/15 1407    Subjective  This was challenging   Pertinent History Diagnosed with Breast CA Oct 2013, mastectomy Jan 2014. T5 resection Oct 2016 with consequent incomplete SCI   Limitations NO heat modalities   Patient Stated Goals I want to be able to walk. I would love to be able to go into my kitchen and stand to clean my counters. I want to get off my blood thinner.    Currently in Pain? No/denies                      OT Treatments/Exercises (OP) - 08/11/15 0001    ADLs   LB Dressing Pt tying shoes sitting EOB for abdominal/core strengthening. Pt then advanced to sitting EOB on compliant surface to simulate donning/doffing pants over feet. Pt then progressed to standing and simulated donning/doffing pants over hips both hands while controlling LE's in squat position for toileting.    Functional Mobility Pt standing  to perform dynamic reaching ipsilaterally and contralaterally with wt shifts bilaterally. Progressed to standing for kitchen tasks to retrieve objects from lower cabinets both sides with 1 hand countertop support. Pt also shown how to retrieve object from higher cabinet with two hands while stabalizing hips against counter for support/balance and fall prevention.                   OT Short Term Goals - 08/11/15 1417    OT SHORT TERM GOAL #1   Title Pt will be independent with initial UE & core HEP   Status Achieved   OT SHORT TERM GOAL #2   Title Pt will be independent with lateral leans to pull pants up/down for ADLs   Status Achieved  while seated   OT SHORT TERM GOAL #3   Title Pt will be supervision in standing position with RW for support for pulling pants up/down during dressing and toileting needs   Status Achieved  with one hand support   OT SHORT TERM GOAL #4   Title Pt will be able to stand for at least 10 minutes during ADL task with no rest break   Status Achieved   OT SHORT TERM GOAL #5   Title Pt will complete simple meal prep or cooking activity in sit to/from stand position with supervision using RW prn   Status Achieved  mostly from w/c level, but standing to retrieve items, etc.           OT Long Term Goals - 07/10/15 1446    OT LONG TERM GOAL #1   Title Pt will be independent with upgraded UE & core HEP   Time 8   Period Weeks   Status New   OT LONG TERM GOAL #2   Title Pt will be mod I with shower stall transfers using shower bench and grab bars prn   Time 8   Period Weeks   Status New   OT LONG TERM GOAL #3   Title Pt will tolerate dynamic standing activity, task, or ADL for at least 20 minutes with only one seated rest break if needed    Time 8   Period Weeks   Status New   OT LONG TERM GOAL #4   Title Pt will be mod I with simple meal prep or cooking activity in sit to/from stand position using RW prn   Time 8   Period Weeks   Status  New   OT LONG TERM GOAL #  5   Title --   Time --   Period --   Status --               Plan - 08/11/15 1414    Clinical Impression Statement Pt met all STG's with compensations. Pt making steady progress towards dynamic standing tasks. Pt is very motivated   Plan w/c negotiation in public restrooms managing getting in/out with heavy door, continue trunk control and dynamic standing   Consulted and Agree with Plan of Care Patient        Problem List Patient Active Problem List   Diagnosis Date Noted  . Anemia of chronic disease 05/21/2015  . Metastatic cancer to spine ([place]) 03/27/2015  . Thrombocytopenia ([place]) 03/12/2015  . Prerenal azotemia 03/12/2015  . Pain   . Recurrent UTI--due to Klebsiella   . Paraplegia at T4 level ([place]) 02/20/2015  . Neurogenic bowel 02/20/2015  . Neurogenic bladder 02/20/2015  . Postoperative anemia due to acute blood loss 02/17/2015  . Spinal cord compression due to malignant neoplasm metastatic to spine ([place]) 02/15/2015  . Epidural mass 02/15/2015  . Thoracic spine tumor   . Pathologic fracture of vertebra 02/14/2015  . Pathologic fracture of thoracic vertebrae 02/14/2015  . Breast cancer metastasized to bone ([place]) 02/14/2015  . Hyperlipidemia 02/14/2015  . H. pylori infection   . HTN (hypertension) 04/17/2012  . Primary cancer of upper outer quadrant of right female breast ([place]) 03/08/2012    Carey Bullocks, OTR/L 08/11/2015, 2:18 PM  [place] [facility] [address] [place], [place], [address] Phone: [phone]   Fax:  [phone]  Name: Heather [name] MRN: [id number] Date of Birth: 21-Jun-1945

## 2015-08-11 NOTE — Therapy (Signed)
[facility] [address] [place], [place], [address] Phone: [phone]   Fax:  [phone]  Physical Therapy Treatment  Patient Details  Name: Heather [name] MRN: [id number] Date of Birth: November 28, 1945 Referring Provider: Alger Simons, MD  Encounter Date: 08/11/2015      PT End of Session - 08/11/15 1440    Visit Number 18   Number of Visits 24   Date for PT Re-Evaluation 08/21/15   PT Start Time 6270   PT Stop Time 1444   PT Time Calculation (min) 41 min   Equipment Utilized During Treatment Gait belt   Activity Tolerance Patient tolerated treatment well   Behavior During Therapy [facility] for tasks assessed/performed      Past Medical History  Diagnosis Date  . Chronic kidney disease   . Hx: UTI (urinary tract infection)   . Right shoulder pain   . Right knee pain   . Hypertension     recently started Aldactone   . Hyperlipidemia     but not on meds;diet and exercise controlled  . Dizziness     has been going on for 37month;medical MD aware  . H/O hiatal hernia   . GERD (gastroesophageal reflux disease)     Tums prn  . Hemorrhoids   . Urinary frequency     d/t taking Aldactone  . History of UTI   . Anemia     hx of  . Cataract     immature-not sure of which eye  . Anxiety     r/t updated surgery  . Insomnia     takes Melatoniin daily  . Breast cancer ([place]     right  . Skin cancer   . Hx of radiation therapy 10/25/12- 12/11/12    right chest wall/regional lymph nodes 5040 cGy, 28 sessions, right chest wall boost 1000 cGy 1 session  . H. pylori infection   . Metastasis to spinal column (HCC)     T5  . Complication of anesthesia     pt states she is sensitive to meds  . PONV (postoperative nausea and vomiting)     pt experienced hair loss, confusion and combative- 02/15/15    Past Surgical History  Procedure Laterality Date  . Cyst removed from left breast  1970  . Left wrist surgery   2004   with plate  . Colonoscopy    . Esophagogastroduodenoscopy    . Mastectomy, radical    . Mastectomy modified radical  05/10/2012    Procedure: MASTECTOMY MODIFIED RADICAL;  Surgeon: PMerrie Roof MD;  [place] [facility] [place]  Service: General;  Laterality: Right;  RIGHT MODIFIED RADICAL MASTECTOMY  . Portacath placement  05/10/2012    Procedure: INSERTION PORT-A-CATH;  Surgeon: PMerrie Roof MD;  [place] [facility]  Service: General;  Laterality: Left;  . Decompressive lumbar laminectomy level 4 N/A 02/15/2015    Procedure: DECOMPRESSION T5 AND T3-T7 STABALIZATION;  Surgeon: NConsuella Lose MD;  [facility];  Service: Neurosurgery;  Laterality: N/A;  . Laminectomy N/A 03/27/2015    Procedure: Thoracic Four-Thoracic Six Laminectomy for tumor;  Surgeon: NConsuella Lose MD;  [place];  Service: Neurosurgery;  Laterality: N/A;  T4-T6 Laminectomy    There were no vitals filed for this visit.  Visit Diagnosis:  Unsteadiness on feet  Muscle weakness (generalized)  Abnormal posture  Decreased strength, endurance, and mobility  Postural fatigue  Abnormality of gait  Weakness of both lower  extremities  Pain  Paraplegia at T4 level [facility])  Incomplete spinal cord lesion at T1-T6 level without bone injury, initial encounter [facility])      Subjective Assessment - 08/11/15 1407    Subjective doing well; having band like pain all day along back (T3/4-T8/9)   Patient Stated Goals improve mobility; walk   Currently in Pain? Yes   Pain Score 5    Pain Location Back   Pain Orientation Upper   Pain Descriptors / Indicators Aching   Pain Type Chronic pain   Pain Onset More than a month ago   Pain Frequency Constant   Aggravating Factors  activity; constant   Pain Relieving Factors medication      Gait: Amb 22' with RW and bil AFO with initially supervision progressing to minguard A as pt fatigued. Improved step length and knee control with gait and during  conversation.  NMR: Standing in // bars with min A: balloon toss to self with 1 UE support and foot on 4" step to promote increased weight bearing; performed bil.  Diona Foley toss to self on level ground without UE support.  Trunk rotation with reaching activities without UE support  Therex: NuStep 4 extremities; L6 x 6 min                             PT Short Term Goals - 07/24/15 1610    PT SHORT TERM GOAL #1   Title independent with initial HEP (07/24/15)   Status Achieved   PT SHORT TERM GOAL #2   Title ambulate > 69' with RW with min A for improved mobility and household ambulation    Status Achieved   PT SHORT TERM GOAL #3   Title perform sit to/from stand transfers with supervision for improved independence    Status Achieved   PT SHORT TERM GOAL #4   Title improve gait velocity to > 0.5 ft/sec for improved function and mobility   Status Achieved           PT Long Term Goals - 06/29/15 1041    PT LONG TERM GOAL #1   Title ambulate > 100' with supervision with RW for improved mobility (08/21/15)   Status New   PT LONG TERM GOAL #2   Title improve gait velocity to > 1.0 ft/sec for improved function and mobility    Status New   PT LONG TERM GOAL #3   Title stand unsupported x 5 min with supervision for improved lower extremity strength and function    Status New   PT LONG TERM GOAL #4   Title perform stand pivot transfers with supervision for improved function and independence   Status New               Plan - 08/11/15 1440    Clinical Impression Statement Pt increased ambulation today and tolerated standing activities without UE support today.  Uses good hip strategy to correct balance sways.   PT Next Visit Plan hamstring strengthening; standing in parallel bars, gait (ramp negotiation) and standing tolerance; LE strengthening possibly using Leg press if appropriate per pt request; core/hip strengthening   Consulted and Agree with Plan of Care  Patient   Family Member Consulted husband        Problem List Patient Active Problem List   Diagnosis Date Noted  . Anemia of chronic disease 05/21/2015  . Metastatic cancer to spine ([place]) 03/27/2015  . Thrombocytopenia ([place])  03/12/2015  . Prerenal azotemia 03/12/2015  . Pain   . Recurrent UTI--due to Klebsiella   . Paraplegia at T4 level ([place]) 02/20/2015  . Neurogenic bowel 02/20/2015  . Neurogenic bladder 02/20/2015  . Postoperative anemia due to acute blood loss 02/17/2015  . Spinal cord compression due to malignant neoplasm metastatic to spine ([place]) 02/15/2015  . Epidural mass 02/15/2015  . Thoracic spine tumor   . Pathologic fracture of vertebra 02/14/2015  . Pathologic fracture of thoracic vertebrae 02/14/2015  . Breast cancer metastasized to bone ([place]) 02/14/2015  . Hyperlipidemia 02/14/2015  . H. pylori infection   . HTN (hypertension) 04/17/2012  . Primary cancer of upper outer quadrant of right female breast ([place]) 03/08/2012   Laureen Abrahams, PT, DPT 08/11/2015 2:54 PM  [facility] [address] [place], [place], [address] Phone: [phone]   Fax:  [phone]  Name: Heather [name] MRN: [id number] Date of Birth: 07/13/1945

## 2015-08-12 ENCOUNTER — Encounter: Payer: Self-pay | Admitting: Occupational Therapy

## 2015-08-12 ENCOUNTER — Ambulatory Visit: Payer: Medicare Other | Admitting: Occupational Therapy

## 2015-08-12 ENCOUNTER — Ambulatory Visit: Payer: Medicare Other | Admitting: Physical Therapy

## 2015-08-12 DIAGNOSIS — R2681 Unsteadiness on feet: Secondary | ICD-10-CM

## 2015-08-12 DIAGNOSIS — M6281 Muscle weakness (generalized): Secondary | ICD-10-CM | POA: Diagnosis not present

## 2015-08-12 DIAGNOSIS — Z7409 Other reduced mobility: Secondary | ICD-10-CM | POA: Diagnosis not present

## 2015-08-12 DIAGNOSIS — R293 Abnormal posture: Secondary | ICD-10-CM

## 2015-08-12 DIAGNOSIS — R269 Unspecified abnormalities of gait and mobility: Secondary | ICD-10-CM | POA: Diagnosis not present

## 2015-08-12 DIAGNOSIS — R2689 Other abnormalities of gait and mobility: Secondary | ICD-10-CM | POA: Diagnosis not present

## 2015-08-12 NOTE — Therapy (Signed)
[facility] [address] [place], [place], [address] Phone: [phone]   Fax:  [phone]  Physical Therapy Treatment  Patient Details  Name: Heather [name] MRN: [id number] Date of Birth: [birth year] Referring Provider: Alger Simons, MD  Encounter Date: 08/12/2015      PT End of Session - 08/12/15 1452    Visit Number 19   Number of Visits 24   Date for PT Re-Evaluation 08/21/15   PT Start Time 0933   PT Stop Time 1015   PT Time Calculation (min) 42 min   Equipment Utilized During Treatment Gait belt   Activity Tolerance Patient tolerated treatment well   Behavior During Therapy [facility] for tasks assessed/performed      Past Medical History  Diagnosis Date  . Chronic kidney disease   . Hx: UTI (urinary tract infection)   . Right shoulder pain   . Right knee pain   . Hypertension     recently started Aldactone   . Hyperlipidemia     but not on meds;diet and exercise controlled  . Dizziness     has been going on for 18month;medical MD aware  . H/O hiatal hernia   . GERD (gastroesophageal reflux disease)     Tums prn  . Hemorrhoids   . Urinary frequency     d/t taking Aldactone  . History of UTI   . Anemia     hx of  . Cataract     immature-not sure of which eye  . Anxiety     r/t updated surgery  . Insomnia     takes Melatoniin daily  . [place]     [place]  . Skin cancer   . Hx of radiation therapy 10/25/12- 12/11/12    right chest wall/regional lymph nodes 5040 cGy, 28 sessions, right chest wall boost 1000 cGy 1 session  . H. pylori infection   . Metastasis to spinal column (HCC)     T5  . Complication of anesthesia     pt states she is sensitive to meds  . PONV (postoperative nausea and vomiting)     pt experienced hair loss, confusion and combative- 02/15/15    Past Surgical History  Procedure Laterality Date  . Cyst removed from left breast  1970  . Left wrist surgery   2004   with plate  . Colonoscopy    . Esophagogastroduodenoscopy    . Mastectomy, radical    . Mastectomy modified radical  05/10/2012    Procedure: MASTECTOMY MODIFIED RADICAL;  Surgeon: PMerrie Roof MD;  [facility]  Service: General;  Laterality: Right;  RIGHT MODIFIED RADICAL MASTECTOMY  . Portacath placement  05/10/2012    Procedure: INSERTION PORT-A-CATH;  Surgeon: PMerrie Roof MD;  [place] [facility]  Service: General;  Laterality: Left;  . Decompressive lumbar laminectomy level 4 N/A 02/15/2015    Procedure: DECOMPRESSION T5 AND T3-T7 STABALIZATION;  Surgeon: NConsuella Lose MD;  [facility];  Service: Neurosurgery;  Laterality: N/A;  . Laminectomy N/A 03/27/2015    Procedure: Thoracic Four-Thoracic Six Laminectomy for tumor;  Surgeon: NConsuella Lose MD;  [facility];  Service: Neurosurgery;  Laterality: N/A;  T4-T6 Laminectomy    There were no vitals filed for this visit.  Visit Diagnosis:  Unsteadiness on feet  Muscle weakness (generalized)      Subjective Assessment - 08/12/15 0952    Subjective Denies pain.  Wants to continue to  increase walking tolerance.   Limitations House hold activities;Standing;Walking   How long can you walk comfortably? first time 2/13: 30-40 ft with RW   Patient Stated Goals improve mobility; walk   Currently in Pain? No/denies                         [facility] Adult PT Treatment/Exercise - 08/12/15 0001    Transfers   Transfers Sit to Stand;Stand to Sit   Sit to Stand 5: Supervision   Stand to Sit 5: Supervision   Ambulation/Gait   Ambulation/Gait Yes   Ambulation/Gait Assistance 4: Min guard   Ambulation/Gait Assistance Details no LOB noted today   Ambulation Distance (Feet) 360 Feet   Assistive device Rolling walker   Gait Pattern Step-through pattern;Decreased hip/knee flexion - right;Decreased hip/knee flexion - left;Decreased dorsiflexion - right;Decreased dorsiflexion - left   Ambulation  Surface Level;Indoor   Stairs Yes   Stairs Assistance 1: +2 Total assist;Patient percentage (comment)  +2 for safety-pt performed >90%   Stairs Assistance Details (indicate cue type and reason) cues for technique and safety   Stair Management Technique Two rails;Step to pattern;Forwards   Number of Stairs 4  x 2   Height of Stairs 6   Knee/Hip Exercises: Standing   Forward Step Up Both;2 sets;15 reps;Hand Hold: 2;Step Height: 4"   Forward Step Up Limitations bil AFO's and tactile assist to control knee from hyperextending    Step Down Both;2 sets;15 reps;20 reps;Step Height: 4"   Step Down Limitations bil AFO's with tactile cues to control knee stability                PT Education - 08/12/15 1443    Education provided Yes   Education Details stair instruction, educating husband on stair instruction before attempting at son's home, walking into clinic with RW and husbands assist to increase endurance   Person(s) Educated Patient   Methods Explanation;Demonstration   Comprehension Verbalized understanding          PT Short Term Goals - 07/24/15 0953    PT SHORT TERM GOAL #1   Title independent with initial HEP (07/24/15)   Status Achieved   PT SHORT TERM GOAL #2   Title ambulate > 20' with RW with min A for improved mobility and household ambulation    Status Achieved   PT SHORT TERM GOAL #3   Title perform sit to/from stand transfers with supervision for improved independence    Status Achieved   PT SHORT TERM GOAL #4   Title improve gait velocity to > 0.5 ft/sec for improved function and mobility   Status Achieved           PT Long Term Goals - 06/29/15 1041    PT LONG TERM GOAL #1   Title ambulate > 100' with supervision with RW for improved mobility (08/21/15)   Status New   PT LONG TERM GOAL #2   Title improve gait velocity to > 1.0 ft/sec for improved function and mobility    Status New   PT LONG TERM GOAL #3   Title stand unsupported x 5 min with  supervision for improved lower extremity strength and function    Status New   PT LONG TERM GOAL #4   Title perform stand pivot transfers with supervision for improved function and independence   Status New               Plan - 08/12/15 1453  Clinical Impression Statement Pt able to go up/down steps with min assist today.  Strength and endurance improving.  Continue PT per POC.   Pt will benefit from skilled therapeutic intervention in order to improve on the following deficits Abnormal gait;Difficulty walking;Pain;Impaired sensation;Decreased strength;Decreased mobility;Decreased balance   Rehab Potential Good   PT Frequency 3x / week   PT Duration 8 weeks   PT Treatment/Interventions ADLs/Self Care Home Management;Electrical Stimulation;Therapeutic exercise;Therapeutic activities;Functional mobility training;Stair training;Gait training;DME Instruction;Balance training;Neuromuscular re-education;Patient/family education;Orthotic Fit/Training   PT Next Visit Plan stair instruction if husband present;LE strengthening and gait with RW   Consulted and Agree with Plan of Care Patient        Problem List Patient Active Problem List   Diagnosis Date Noted  . Anemia of chronic disease 05/21/2015  . Metastatic cancer to spine ([place]) 03/27/2015  . Thrombocytopenia ([place]) 03/12/2015  . Prerenal azotemia 03/12/2015  . Pain   . Recurrent UTI--due to Klebsiella   . Paraplegia at T4 level ([place]) 02/20/2015  . Neurogenic bowel 02/20/2015  . Neurogenic bladder 02/20/2015  . Postoperative anemia due to acute blood loss 02/17/2015  . Spinal cord compression due to malignant neoplasm metastatic to spine ([place]) 02/15/2015  . Epidural mass 02/15/2015  . Thoracic spine tumor   . Pathologic fracture of vertebra 02/14/2015  . Pathologic fracture of thoracic vertebrae 02/14/2015  . Breast cancer metastasized to bone ([place]) 02/14/2015  . Hyperlipidemia 02/14/2015  . H. pylori infection   . HTN  (hypertension) 04/17/2012  . Primary cancer of upper outer quadrant of right female breast ([place]) 03/08/2012    Narda Bonds 08/12/2015, 2:55 PM  [facility] [address] [place], [place], [address] Phone: [phone]   Fax:  [phone]  Name: Heather [name] MRN: [id number] Date of Birth: [birth year]    Narda Bonds, [place] 08/12/2015 2:55 PM Phone: [phone] Fax: [phone]

## 2015-08-12 NOTE — Therapy (Signed)
[facility] [address] [place], [place], [address] Phone: [phone]   Fax:  [phone]  Occupational Therapy Treatment  Patient Details  Name: Heather [name] MRN: [id number] Date of Birth: [birth year] Referring Provider: Dr. Naaman Plummer   Encounter Date: 08/12/2015      OT End of Session - 08/12/15 0952    Visit Number 8   Number of Visits 17   Date for OT Re-Evaluation 09/04/15   Authorization Type medicare - g code needed every 10 visits   Authorization - Visit Number 8   Authorization - Number of Visits 10   OT Start Time 0850   OT Stop Time 0935   OT Time Calculation (min) 45 min   Activity Tolerance Patient tolerated treatment well      Past Medical History  Diagnosis Date  . Chronic kidney disease   . Hx: UTI (urinary tract infection)   . Right shoulder pain   . Right knee pain   . Hypertension     recently started Aldactone   . Hyperlipidemia     but not on meds;diet and exercise controlled  . Dizziness     has been going on for 63month;medical MD aware  . H/O hiatal hernia   . GERD (gastroesophageal reflux disease)     Tums prn  . Hemorrhoids   . Urinary frequency     d/t taking Aldactone  . History of UTI   . Anemia     hx of  . Cataract     immature-not sure of which eye  . Anxiety     r/t updated surgery  . Insomnia     takes Melatoniin daily  . [place]     right  . Skin cancer   . Hx of radiation therapy 10/25/12- 12/11/12    right chest wall/regional lymph nodes 5040 cGy, 28 sessions, right chest wall boost 1000 cGy 1 session  . H. pylori infection   . Metastasis to spinal column (HCC)     T5  . Complication of anesthesia     pt states she is sensitive to meds  . PONV (postoperative nausea and vomiting)     pt experienced hair loss, confusion and combative- 02/15/15    Past Surgical History  Procedure Laterality Date  . Cyst removed from left breast  1970  . Left  wrist surgery   2004    with plate  . Colonoscopy    . Esophagogastroduodenoscopy    . Mastectomy, radical    . Mastectomy modified radical  05/10/2012    Procedure: MASTECTOMY MODIFIED RADICAL;  Surgeon: PMerrie Roof MD;  [facility]  Service: General;  Laterality: Right;  RIGHT MODIFIED RADICAL MASTECTOMY  . Portacath placement  05/10/2012    Procedure: INSERTION PORT-A-CATH;  Surgeon: PMerrie Roof MD;  [facility]  Service: General;  Laterality: Left;  . Decompressive lumbar laminectomy level 4 N/A 02/15/2015    Procedure: DECOMPRESSION T5 AND T3-T7 STABALIZATION;  Surgeon: NConsuella Lose MD;  [facility];  Service: Neurosurgery;  Laterality: N/A;  . Laminectomy N/A 03/27/2015    Procedure: Thoracic Four-Thoracic Six Laminectomy for tumor;  Surgeon: NConsuella Lose MD;  [facility];  Service: Neurosurgery;  Laterality: N/A;  T4-T6 Laminectomy    There were no vitals filed for this visit.  Visit Diagnosis:  Unsteadiness on feet  Muscle weakness (generalized)  Abnormal posture      Subjective  Assessment - 08/12/15 0929    Pertinent History Diagnosed with Breast CA Oct 2013, mastectomy Jan 2014. T5 resection Oct 2016 with consequent incomplete SCI   Limitations NO heat modalities   Patient Stated Goals I want to be able to walk. I would love to be able to go into my kitchen and stand to clean my counters. I want to get off my blood thinner.    Currently in Pain? No/denies                      OT Treatments/Exercises (OP) - 08/12/15 0001    ADLs   Functional Mobility Continued functional standing/dynamic standing with countertop support - for retrieving objects out of high cabinets, side stepping bilaterally along counter,  and placing/retrieving objects to sides reaching outside BOS with one hand support.    ADL Comments Practiced w/c negotiation getting in/out of restroom while opening and closing door. Pt able to perform  I'ly with modifications after repetition   Neurological Re-education Exercises   Other Exercises 1 Sit to stand x 5 reps slowly for quad control without hand support.    Other Exercises 2 Trunk control sitting EOB for righting reactions when pushed various directions - pt able to control without LOB                   OT Short Term Goals - 08/11/15 1417    OT SHORT TERM GOAL #1   Title Pt will be independent with initial UE & core HEP   Status Achieved   OT SHORT TERM GOAL #2   Title Pt will be independent with lateral leans to pull pants up/down for ADLs   Status Achieved  while seated   OT SHORT TERM GOAL #3   Title Pt will be supervision in standing position with RW for support for pulling pants up/down during dressing and toileting needs   Status Achieved  with one hand support   OT SHORT TERM GOAL #4   Title Pt will be able to stand for at least 10 minutes during ADL task with no rest break   Status Achieved   OT SHORT TERM GOAL #5   Title Pt will complete simple meal prep or cooking activity in sit to/from stand position with supervision using RW prn   Status Achieved  mostly from w/c level, but standing to retrieve items, etc.           OT Long Term Goals - 08/12/15 0942    OT LONG TERM GOAL #1   Title Pt will be independent with upgraded UE & core HEP   Time 8   Period Weeks   Status New   OT LONG TERM GOAL #2   Title Pt will be mod I with shower stall transfers using shower bench and grab bars prn   Time 8   Period Weeks   Status Achieved   OT LONG TERM GOAL #3   Title Pt will tolerate dynamic standing activity, task, or ADL for at least 20 minutes with only one seated rest break if needed    Time 8   Period Weeks   Status New   OT LONG TERM GOAL #4   Title Pt will be mod I with simple meal prep or cooking activity in sit to/from stand position using RW prn   Time 8   Period Weeks   Status New  Plan - 08/12/15 0952     Clinical Impression Statement Pt met LTG #2. Pt has progressed to Mod I level with all BADLS and light cooking tasks in w/c level to standing level with countertop support.    Plan Place O.T. on hold until pt is able to functionally ambulate at walker level in prep for IADLS completely from walker level (Pt is completely Independent from w/c to standing level)   Consulted and Agree with Plan of Care Patient        Problem List Patient Active Problem List   Diagnosis Date Noted  . Anemia of chronic disease 05/21/2015  . Metastatic cancer to spine ([place]) 03/27/2015  . Thrombocytopenia ([place]) 03/12/2015  . Prerenal azotemia 03/12/2015  . Pain   . Recurrent UTI--due to Klebsiella   . Paraplegia at T4 level ([place]) 02/20/2015  . Neurogenic bowel 02/20/2015  . Neurogenic bladder 02/20/2015  . Postoperative anemia due to acute blood loss 02/17/2015  . Spinal cord compression due to malignant neoplasm metastatic to spine ([place]) 02/15/2015  . Epidural mass 02/15/2015  . Thoracic spine tumor   . Pathologic fracture of vertebra 02/14/2015  . Pathologic fracture of thoracic vertebrae 02/14/2015  . Breast cancer metastasized to bone ([place]) 02/14/2015  . Hyperlipidemia 02/14/2015  . H. pylori infection   . HTN (hypertension) 04/17/2012  . Primary cancer of upper outer quadrant of right female breast ([place]) 03/08/2012    Carey Bullocks, OTR/L 08/12/2015, 9:58 AM  [facility] [address] [place], [place], [address] Phone: [phone]   Fax:  [phone]  Name: Heather [name] MRN: [id number] Date of Birth: 1946-03-09

## 2015-08-13 ENCOUNTER — Other Ambulatory Visit (HOSPITAL_BASED_OUTPATIENT_CLINIC_OR_DEPARTMENT_OTHER): Payer: Medicare Other

## 2015-08-13 ENCOUNTER — Ambulatory Visit (HOSPITAL_BASED_OUTPATIENT_CLINIC_OR_DEPARTMENT_OTHER): Payer: Medicare Other

## 2015-08-13 ENCOUNTER — Ambulatory Visit: Payer: Medicare Other

## 2015-08-13 VITALS — BP 122/71 | HR 78 | Temp 98.1°F | Resp 16

## 2015-08-13 DIAGNOSIS — C50411 Malignant neoplasm of upper-outer quadrant of right female breast: Secondary | ICD-10-CM | POA: Diagnosis not present

## 2015-08-13 DIAGNOSIS — C7951 Secondary malignant neoplasm of bone: Secondary | ICD-10-CM

## 2015-08-13 DIAGNOSIS — Z5112 Encounter for antineoplastic immunotherapy: Secondary | ICD-10-CM | POA: Diagnosis not present

## 2015-08-13 DIAGNOSIS — Z95828 Presence of other vascular implants and grafts: Secondary | ICD-10-CM

## 2015-08-13 DIAGNOSIS — C50911 Malignant neoplasm of unspecified site of right female breast: Secondary | ICD-10-CM

## 2015-08-13 LAB — COMPREHENSIVE METABOLIC PANEL
ALT: 24 U/L (ref 0–55)
AST: 27 U/L (ref 5–34)
Albumin: 3.4 g/dL — ABNORMAL LOW (ref 3.5–5.0)
Alkaline Phosphatase: 70 U/L (ref 40–150)
Anion Gap: 5 mEq/L (ref 3–11)
BUN: 13.1 mg/dL (ref 7.0–26.0)
CO2: 29 mEq/L (ref 22–29)
Calcium: 9.6 mg/dL (ref 8.4–10.4)
Chloride: 105 mEq/L (ref 98–109)
Creatinine: 0.8 mg/dL (ref 0.6–1.1)
EGFR: 77 mL/min/{1.73_m2} — ABNORMAL LOW (ref >90)
Glucose: 98 mg/dl (ref 70–140)
Potassium: 4 mEq/L (ref 3.5–5.1)
Sodium: 139 mEq/L (ref 136–145)
Total Bilirubin: 0.32 mg/dL (ref 0.20–1.20)
Total Protein: 6.7 g/dL (ref 6.4–8.3)

## 2015-08-13 LAB — CBC WITH DIFFERENTIAL/PLATELET
BASO%: 0.7 % (ref 0.0–2.0)
Basophils Absolute: 0 10*3/uL (ref 0.0–0.1)
EOS%: 1.2 % (ref 0.0–7.0)
Eosinophils Absolute: 0.1 10*3/uL (ref 0.0–0.5)
HCT: 35.2 % (ref 34.8–46.6)
HGB: 11.2 g/dL — ABNORMAL LOW (ref 11.6–15.9)
LYMPH%: 12.6 % — ABNORMAL LOW (ref 14.0–49.7)
MCH: 25.6 pg (ref 25.1–34.0)
MCHC: 31.8 g/dL (ref 31.5–36.0)
MCV: 80.5 fL (ref 79.5–101.0)
MONO#: 0.4 10*3/uL (ref 0.1–0.9)
MONO%: 10.2 % (ref 0.0–14.0)
NEUT#: 3.2 10*3/uL (ref 1.5–6.5)
NEUT%: 75.3 % (ref 38.4–76.8)
Platelets: 169 10*3/uL (ref 145–400)
RBC: 4.37 10*6/uL (ref 3.70–5.45)
RDW: 16.4 % — ABNORMAL HIGH (ref 11.2–14.5)
WBC: 4.2 10*3/uL (ref 3.9–10.3)
lymph#: 0.5 10*3/uL — ABNORMAL LOW (ref 0.9–3.3)

## 2015-08-13 MED ORDER — DIPHENHYDRAMINE HCL 25 MG PO CAPS
ORAL_CAPSULE | ORAL | Status: AC
  Filled 2015-08-13: qty 1

## 2015-08-13 MED ORDER — SODIUM CHLORIDE 0.9% FLUSH
10.0000 mL | INTRAVENOUS | Status: DC | PRN
  Administered 2015-08-13: 10 mL via INTRAVENOUS
  Filled 2015-08-13: qty 10

## 2015-08-13 MED ORDER — DIPHENHYDRAMINE HCL 25 MG PO CAPS
25.0000 mg | ORAL_CAPSULE | Freq: Once | ORAL | Status: AC
  Administered 2015-08-13: 25 mg via ORAL

## 2015-08-13 MED ORDER — SODIUM CHLORIDE 0.9 % IJ SOLN
10.0000 mL | INTRAMUSCULAR | Status: DC | PRN
  Administered 2015-08-13: 10 mL
  Filled 2015-08-13: qty 10

## 2015-08-13 MED ORDER — SODIUM CHLORIDE 0.9 % IV SOLN
6.0000 mg/kg | Freq: Once | INTRAVENOUS | Status: AC
  Administered 2015-08-13: 399 mg via INTRAVENOUS
  Filled 2015-08-13: qty 19

## 2015-08-13 MED ORDER — SODIUM CHLORIDE 0.9 % IV SOLN
Freq: Once | INTRAVENOUS | Status: AC
  Administered 2015-08-13: 10:00:00 via INTRAVENOUS

## 2015-08-13 MED ORDER — ACETAMINOPHEN 325 MG PO TABS
650.0000 mg | ORAL_TABLET | Freq: Once | ORAL | Status: AC
  Administered 2015-08-13: 650 mg via ORAL

## 2015-08-13 MED ORDER — HEPARIN SOD (PORK) LOCK FLUSH 100 UNIT/ML IV SOLN
500.0000 [IU] | Freq: Once | INTRAVENOUS | Status: AC | PRN
  Administered 2015-08-13: 500 [IU]
  Filled 2015-08-13: qty 5

## 2015-08-13 MED ORDER — ACETAMINOPHEN 325 MG PO TABS
ORAL_TABLET | ORAL | Status: AC
  Filled 2015-08-13: qty 2

## 2015-08-13 NOTE — Patient Instructions (Signed)

## 2015-08-13 NOTE — Patient Instructions (Signed)
Trastuzumab injection for infusion What is this medicine? TRASTUZUMAB (tras TOO zoo mab) is a monoclonal antibody. It is used to treat breast cancer and stomach cancer. This medicine may be used for other purposes; ask your health care provider or pharmacist if you have questions. What should I tell my health care provider before I take this medicine? They need to know if you have any of these conditions: -heart disease -heart failure -infection (especially a virus infection such as chickenpox, cold sores, or herpes) -lung or breathing disease, like asthma -recent or ongoing radiation therapy -an unusual or allergic reaction to trastuzumab, benzyl alcohol, or other medications, foods, dyes, or preservatives -pregnant or trying to get pregnant -breast-feeding How should I use this medicine? This drug is given as an infusion into a vein. It is administered in a hospital or clinic by a specially trained health care professional. Talk to your pediatrician regarding the use of this medicine in children. This medicine is not approved for use in children. Overdosage: If you think you have taken too much of this medicine contact a poison control center or emergency room at once. NOTE: This medicine is only for you. Do not share this medicine with others. What if I miss a dose? It is important not to miss a dose. Call your doctor or health care professional if you are unable to keep an appointment. What may interact with this medicine? -doxorubicin -warfarin This list may not describe all possible interactions. Give your health care provider a list of all the medicines, herbs, non-prescription drugs, or dietary supplements you use. Also tell them if you smoke, drink alcohol, or use illegal drugs. Some items may interact with your medicine. What should I watch for while using this medicine? Visit your doctor for checks on your progress. Report any side effects. Continue your course of treatment even  though you feel ill unless your doctor tells you to stop. Call your doctor or health care professional for advice if you get a fever, chills or sore throat, or other symptoms of a cold or flu. Do not treat yourself. Try to avoid being around people who are sick. You may experience fever, chills and shaking during your first infusion. These effects are usually mild and can be treated with other medicines. Report any side effects during the infusion to your health care professional. Fever and chills usually do not happen with later infusions. Do not become pregnant while taking this medicine or for 7 months after stopping it. Women should inform their doctor if they wish to become pregnant or think they might be pregnant. Women of child-bearing potential will need to have a negative pregnancy test before starting this medicine. There is a potential for serious side effects to an unborn child. Talk to your health care professional or pharmacist for more information. Do not breast-feed an infant while taking this medicine or for 7 months after stopping it. Women must use effective birth control with this medicine. What side effects may I notice from receiving this medicine? Side effects that you should report to your doctor or other health care professional as soon as possible: -breathing difficulties -chest pain or palpitations -cough -dizziness or fainting -fever or chills, sore throat -skin rash, itching or hives -swelling of the legs or ankles -unusually weak or tired Side effects that usually do not require medical attention (report to your doctor or other health care professional if they continue or are bothersome): -loss of appetite -headache -muscle aches -nausea This  list may not describe all possible side effects. Call your doctor for medical advice about side effects. You may report side effects to FDA at 1-800-FDA-1088. Where should I keep my medicine? This drug is given in a hospital  or clinic and will not be stored at home. NOTE: This sheet is a summary. It may not cover all possible information. If you have questions about this medicine, talk to your doctor, pharmacist, or health care provider.    2016, Elsevier/Gold Standard. (2014-08-01 11:49:32)

## 2015-08-14 ENCOUNTER — Ambulatory Visit: Payer: Medicare Other | Admitting: Physical Therapy

## 2015-08-14 DIAGNOSIS — M6281 Muscle weakness (generalized): Secondary | ICD-10-CM

## 2015-08-14 DIAGNOSIS — R293 Abnormal posture: Secondary | ICD-10-CM | POA: Diagnosis not present

## 2015-08-14 DIAGNOSIS — R2689 Other abnormalities of gait and mobility: Secondary | ICD-10-CM | POA: Diagnosis not present

## 2015-08-14 DIAGNOSIS — Z7409 Other reduced mobility: Secondary | ICD-10-CM | POA: Diagnosis not present

## 2015-08-14 DIAGNOSIS — R2681 Unsteadiness on feet: Secondary | ICD-10-CM

## 2015-08-14 DIAGNOSIS — R269 Unspecified abnormalities of gait and mobility: Secondary | ICD-10-CM | POA: Diagnosis not present

## 2015-08-14 NOTE — Therapy (Signed)
[facility] [address] [place], [place], [address] Phone: [phone]   Fax:  [phone]  Physical Therapy Treatment  Patient Details  Name: Heather [name] MRN: [id number] Date of Birth: [birth year] Referring Provider: Alger Simons, MD  Encounter Date: 08/14/2015      PT End of Session - 08/14/15 1137    Visit Number 20   Number of Visits 24   Date for PT Re-Evaluation 08/21/15   PT Start Time 0930   PT Stop Time 1015   PT Time Calculation (min) 45 min   Equipment Utilized During Treatment Gait belt   Activity Tolerance Patient tolerated treatment well   Behavior During Therapy [facility] for tasks assessed/performed      Past Medical History  Diagnosis Date  . Chronic kidney disease   . Hx: UTI (urinary tract infection)   . Right shoulder pain   . Right knee pain   . Hypertension     recently started Aldactone   . Hyperlipidemia     but not on meds;diet and exercise controlled  . Dizziness     has been going on for 24month;medical MD aware  . H/O hiatal hernia   . GERD (gastroesophageal reflux disease)     Tums prn  . Hemorrhoids   . Urinary frequency     d/t taking Aldactone  . History of UTI   . Anemia     hx of  . Cataract     immature-not sure of which eye  . Anxiety     r/t updated surgery  . Insomnia     takes Melatoniin daily  . [place] ([place]     right  . Skin cancer   . Hx of radiation therapy 10/25/12- 12/11/12    right chest wall/regional lymph nodes 5040 cGy, 28 sessions, right chest wall boost 1000 cGy 1 session  . H. pylori infection   . Metastasis to spinal column (HCC)     T5  . Complication of anesthesia     pt states she is sensitive to meds  . PONV (postoperative nausea and vomiting)     pt experienced hair loss, confusion and combative- 02/15/15    Past Surgical History  Procedure Laterality Date  . Cyst removed from left breast  1970  . Left wrist surgery   2004   with plate  . Colonoscopy    . Esophagogastroduodenoscopy    . Mastectomy, radical    . Mastectomy modified radical  05/10/2012    Procedure: MASTECTOMY MODIFIED RADICAL;  Surgeon: PMerrie Roof MD;  [facility]  Service: General;  Laterality: Right;  RIGHT MODIFIED RADICAL MASTECTOMY  . Portacath placement  05/10/2012    Procedure: INSERTION PORT-A-CATH;  Surgeon: PMerrie Roof MD;  [facility]  Service: General;  Laterality: Left;  . Decompressive lumbar laminectomy level 4 N/A 02/15/2015    Procedure: DECOMPRESSION T5 AND T3-T7 STABALIZATION;  Surgeon: NConsuella Lose MD;  [facility];  Service: Neurosurgery;  Laterality: N/A;  . Laminectomy N/A 03/27/2015    Procedure: Thoracic Four-Thoracic Six Laminectomy for tumor;  Surgeon: NConsuella Lose MD;  [facility];  Service: Neurosurgery;  Laterality: N/A;  T4-T6 Laminectomy    There were no vitals filed for this visit.      Subjective Assessment - 08/14/15 0929    Subjective (p) walked into clinic today with walker   Patient Stated Goals (p) improve mobility; walk  Currently in Pain? (p) No/denies                         [facility] Adult PT Treatment/Exercise - 08/14/15 1132    Ambulation/Gait   Ambulation/Gait Yes   Ambulation/Gait Assistance 1: +2 Total assist;4: Min assist   Ambulation/Gait Assistance Details pt 80%; progressed to 1 person min A   Ambulation Distance (Feet) 500 Feet   Assistive device Lofstrands   Gait Pattern Step-to pattern  4 point progressing to 2 point pattern   Ambulation Surface Level;Indoor   Stairs Yes   Stairs Assistance 4: Min assist   Stairs Assistance Details (indicate cue type and reason) cues for sequencing and going backwards with RW   Stair Management Technique Two rails;Step to pattern;Forwards;With walker;Backwards   Number of Stairs 4  then 2 with RW x 2   Height of Stairs 6                PT Education - 08/14/15 1137     Education provided Yes   Education Details stairs; progression to forearm crutches   Person(s) Educated Patient;Spouse   Methods Explanation;Demonstration   Comprehension Verbalized understanding;Returned demonstration;Need further instruction          PT Short Term Goals - 07/24/15 7209    PT SHORT TERM GOAL #1   Title independent with initial HEP (07/24/15)   Status Achieved   PT SHORT TERM GOAL #2   Title ambulate > 38' with RW with min A for improved mobility and household ambulation    Status Achieved   PT SHORT TERM GOAL #3   Title perform sit to/from stand transfers with supervision for improved independence    Status Achieved   PT SHORT TERM GOAL #4   Title improve gait velocity to > 0.5 ft/sec for improved function and mobility   Status Achieved           PT Long Term Goals - 06/29/15 1041    PT LONG TERM GOAL #1   Title ambulate > 100' with supervision with RW for improved mobility (08/21/15)   Status New   PT LONG TERM GOAL #2   Title improve gait velocity to > 1.0 ft/sec for improved function and mobility    Status New   PT LONG TERM GOAL #3   Title stand unsupported x 5 min with supervision for improved lower extremity strength and function    Status New   PT LONG TERM GOAL #4   Title perform stand pivot transfers with supervision for improved function and independence   Status New               Plan - 08/14/15 1137    Clinical Impression Statement Session focued on stair education with husband to negotiate at son's house and progression from RW to forearm crutches.  Pt initially +2 assist with crutches progressing to min A and 2 point gait pattern.  Will continue to benefit from PT to maximize function and mobility.   PT Next Visit Plan gait with forearm crutches; stairs; ramp/curb PRN, strengthening and balance   Consulted and Agree with Plan of Care Patient   Family Member Consulted husband      Patient will benefit from skilled therapeutic  intervention in order to improve the following deficits and impairments:     Visit Diagnosis: Unsteadiness on feet  Muscle weakness (generalized)  Abnormal posture  Other abnormalities of gait and mobility  G-Codes - 08/14/15 1139    Functional Assessment Tool Used amb up to 500' with forearm crutches   Functional Limitation Mobility: Walking and moving around   Mobility: Walking and Moving Around Current Status [phone]) At least 40 percent but less than 60 percent impaired, limited or restricted   Mobility: Walking and Moving Around Goal Status [phone]) At least 1 percent but less than 20 percent impaired, limited or restricted      Problem List Patient Active Problem List   Diagnosis Date Noted  . Anemia of chronic disease 05/21/2015  . Metastatic cancer to spine ([place]) 03/27/2015  . Thrombocytopenia ([place]) 03/12/2015  . Prerenal azotemia 03/12/2015  . Pain   . Recurrent UTI--due to Klebsiella   . Paraplegia at T4 level ([place]) 02/20/2015  . Neurogenic bowel 02/20/2015  . Neurogenic bladder 02/20/2015  . Postoperative anemia due to acute blood loss 02/17/2015  . Spinal cord compression due to malignant neoplasm metastatic to spine ([place]) 02/15/2015  . Epidural mass 02/15/2015  . Thoracic spine tumor   . Pathologic fracture of vertebra 02/14/2015  . Pathologic fracture of thoracic vertebrae 02/14/2015  . Breast cancer metastasized to bone ([place]) 02/14/2015  . Hyperlipidemia 02/14/2015  . H. pylori infection   . HTN (hypertension) 04/17/2012  . Primary cancer of upper outer quadrant of right female breast ([place]) 03/08/2012   Laureen Abrahams, PT, DPT 08/14/2015 11:40 AM  [facility] [address] [place], [place], [address] Phone: [phone]   Fax:  [phone]  Name: Heather [name] MRN: [id number] Date of Birth: 1946-03-19

## 2015-08-14 NOTE — Addendum Note (Signed)
Addended by: Laureen Abrahams on: 08/14/2015 08:15 AM   Modules accepted: Orders

## 2015-08-17 ENCOUNTER — Encounter: Payer: Medicare Other | Admitting: Occupational Therapy

## 2015-08-17 ENCOUNTER — Encounter: Payer: Self-pay | Admitting: Physical Therapy

## 2015-08-17 ENCOUNTER — Ambulatory Visit: Payer: Medicare Other | Admitting: Physical Therapy

## 2015-08-17 DIAGNOSIS — M6281 Muscle weakness (generalized): Secondary | ICD-10-CM | POA: Diagnosis not present

## 2015-08-17 DIAGNOSIS — R269 Unspecified abnormalities of gait and mobility: Secondary | ICD-10-CM | POA: Diagnosis not present

## 2015-08-17 DIAGNOSIS — R531 Weakness: Secondary | ICD-10-CM

## 2015-08-17 DIAGNOSIS — R2681 Unsteadiness on feet: Secondary | ICD-10-CM | POA: Diagnosis not present

## 2015-08-17 DIAGNOSIS — Z7409 Other reduced mobility: Secondary | ICD-10-CM

## 2015-08-17 DIAGNOSIS — R2689 Other abnormalities of gait and mobility: Secondary | ICD-10-CM

## 2015-08-17 DIAGNOSIS — R29898 Other symptoms and signs involving the musculoskeletal system: Secondary | ICD-10-CM

## 2015-08-17 DIAGNOSIS — R293 Abnormal posture: Secondary | ICD-10-CM

## 2015-08-17 DIAGNOSIS — R6889 Other general symptoms and signs: Secondary | ICD-10-CM

## 2015-08-17 NOTE — Addendum Note (Signed)
Addended by: Hans Eden on: 08/17/2015 01:38 PM   Modules accepted: Orders

## 2015-08-17 NOTE — Therapy (Signed)
[facility] [address] [place], [place], [address] Phone: [phone]   Fax:  [phone]  Physical Therapy Treatment  Patient Details  Name: Heather [name] MRN: [id number] Date of Birth: [birth year] Referring Provider: Alger Simons, MD  Encounter Date: 08/17/2015      PT End of Session - 08/17/15 1637    Visit Number 21   Number of Visits 24   Date for PT Re-Evaluation 08/21/15   PT Start Time 0935   PT Stop Time 1013   PT Time Calculation (min) 38 min   Equipment Utilized During Treatment Gait belt   Activity Tolerance Patient tolerated treatment well   Behavior During Therapy [facility] for tasks assessed/performed      Past Medical History  Diagnosis Date  . Chronic kidney disease   . Hx: UTI (urinary tract infection)   . Right shoulder pain   . Right knee pain   . Hypertension     recently started Aldactone   . Hyperlipidemia     but not on meds;diet and exercise controlled  . Dizziness     has been going on for 25month;medical MD aware  . H/O hiatal hernia   . GERD (gastroesophageal reflux disease)     Tums prn  . Hemorrhoids   . Urinary frequency     d/t taking Aldactone  . History of UTI   . Anemia     hx of  . Cataract     immature-not sure of which eye  . Anxiety     r/t updated surgery  . Insomnia     takes Melatoniin daily  . Breast cancer ([place]     right  . Skin cancer   . Hx of radiation therapy 10/25/12- 12/11/12    right chest wall/regional lymph nodes 5040 cGy, 28 sessions, right chest wall boost 1000 cGy 1 session  . H. pylori infection   . Metastasis to spinal column (HCC)     T5  . Complication of anesthesia     pt states she is sensitive to meds  . PONV (postoperative nausea and vomiting)     pt experienced hair loss, confusion and combative- 02/15/15    Past Surgical History  Procedure Laterality Date  . Cyst removed from left breast  1970  . Left wrist surgery   2004   with plate  . Colonoscopy    . Esophagogastroduodenoscopy    . Mastectomy, radical    . Mastectomy modified radical  05/10/2012    Procedure: MASTECTOMY MODIFIED RADICAL;  Surgeon: PMerrie Roof MD;  [place] [facility]  Service: General;  Laterality: Right;  RIGHT MODIFIED RADICAL MASTECTOMY  . Portacath placement  05/10/2012    Procedure: INSERTION PORT-A-CATH;  Surgeon: PMerrie Roof MD;  [place] [facility]  Service: General;  Laterality: Left;  . Decompressive lumbar laminectomy level 4 N/A 02/15/2015    Procedure: DECOMPRESSION T5 AND T3-T7 STABALIZATION;  Surgeon: NConsuella Lose MD;  [facility];  Service: Neurosurgery;  Laterality: N/A;  . Laminectomy N/A 03/27/2015    Procedure: Thoracic Four-Thoracic Six Laminectomy for tumor;  Surgeon: NConsuella Lose MD;  [facility];  Service: Neurosurgery;  Laterality: N/A;  T4-T6 Laminectomy    There were no vitals filed for this visit.      Subjective Assessment - 08/17/15 0938    Subjective Took pain meds this morning; been having more discomfort and soreness around chest area. Has MRI Wednesday.  Patient Stated Goals improve mobility; walk   Currently in Pain? Yes   Pain Score 3    Pain Location Back   Pain Orientation Upper   Pain Descriptors / Indicators Aching   Pain Type Chronic pain   Pain Onset More than a month ago   Pain Frequency Constant                         [facility] Adult PT Treatment/Exercise - 08/17/15 0001    Ambulation/Gait   Ambulation/Gait Yes   Ambulation/Gait Assistance 4: Min assist   Ambulation Distance (Feet) 230 Feet   Assistive device Lofstrands   Gait Pattern Step-through pattern   Ambulation Surface Level;Indoor   Ramp 4: Min assist  cues for sequence             Balance Exercises - 08/17/15 1001    Balance Exercises: Standing   Retro Gait Other (comment)  gait working on balance with walker.   Turning Right;Left  with RW           PT  Education - 08/17/15 1636    Education provided Yes   Education Details Safety awareness with dynamic gait and distracting environments.   Person(s) Educated Patient   Methods Explanation   Comprehension Verbalized understanding          PT Short Term Goals - 07/24/15 0953    PT SHORT TERM GOAL #1   Title independent with initial HEP (07/24/15)   Status Achieved   PT SHORT TERM GOAL #2   Title ambulate > 50' with RW with min A for improved mobility and household ambulation    Status Achieved   PT SHORT TERM GOAL #3   Title perform sit to/from stand transfers with supervision for improved independence    Status Achieved   PT SHORT TERM GOAL #4   Title improve gait velocity to > 0.5 ft/sec for improved function and mobility   Status Achieved           PT Long Term Goals - 06/29/15 1041    PT LONG TERM GOAL #1   Title ambulate > 100' with supervision with RW for improved mobility (08/21/15)   Status New   PT LONG TERM GOAL #2   Title improve gait velocity to > 1.0 ft/sec for improved function and mobility    Status New   PT LONG TERM GOAL #3   Title stand unsupported x 5 min with supervision for improved lower extremity strength and function    Status New   PT LONG TERM GOAL #4   Title perform stand pivot transfers with supervision for improved function and independence   Status New               Plan - 08/17/15 1014    Clinical Impression Statement Skilled session focused on practising gait technique with [place] crutches and balance with dynamic gait using walker.  Pt continues to require Min A to Min guard and cues for safe technique with gait.   PT Next Visit Plan gait with forearm crutches; stairs; ramp/curb PRN, strengthening and balance, FLOOR transfer if appropriate.   Consulted and Agree with Plan of Care Patient   Family Member Consulted husband      Patient will benefit from skilled therapeutic intervention in order to improve the following  deficits and impairments:     Visit Diagnosis: Unsteadiness on feet  Muscle weakness (generalized)  Abnormal posture  Other abnormalities  of gait and mobility  Decreased strength, endurance, and mobility  Weakness of both lower extremities     Problem List Patient Active Problem List   Diagnosis Date Noted  . Anemia of chronic disease 05/21/2015  . Metastatic cancer to spine ([place]) 03/27/2015  . Thrombocytopenia ([place]) 03/12/2015  . Prerenal azotemia 03/12/2015  . Pain   . Recurrent UTI--due to Klebsiella   . Paraplegia at T4 level ([place]) 02/20/2015  . Neurogenic bowel 02/20/2015  . Neurogenic bladder 02/20/2015  . Postoperative anemia due to acute blood loss 02/17/2015  . Spinal cord compression due to malignant neoplasm metastatic to spine ([place]) 02/15/2015  . Epidural mass 02/15/2015  . Thoracic spine tumor   . Pathologic fracture of vertebra 02/14/2015  . Pathologic fracture of thoracic vertebrae 02/14/2015  . Breast cancer metastasized to bone ([place]) 02/14/2015  . Hyperlipidemia 02/14/2015  . H. pylori infection   . HTN (hypertension) 04/17/2012  . Primary cancer of upper outer quadrant of right female breast ([place]) 03/08/2012    Bjorn Loser, PTA  08/17/2015, 4:41 PM [facility] [address] [place], [place], [address] Phone: [phone]   Fax:  [phone]  Name: Heather [name] MRN: [id number] Date of Birth: 1945-12-02

## 2015-08-19 ENCOUNTER — Ambulatory Visit (HOSPITAL_COMMUNITY)
Admission: RE | Admit: 2015-08-19 | Discharge: 2015-08-19 | Disposition: A | Discharge status: Home / Self Care | Payer: Medicare Other | Source: Ambulatory Visit | Attending: Radiation Oncology | Admitting: Radiation Oncology

## 2015-08-19 ENCOUNTER — Other Ambulatory Visit: Payer: Self-pay | Admitting: Radiation Oncology

## 2015-08-19 DIAGNOSIS — C419 Malignant neoplasm of bone and articular cartilage, unspecified: Secondary | ICD-10-CM | POA: Diagnosis not present

## 2015-08-19 DIAGNOSIS — C50911 Malignant neoplasm of unspecified site of right female breast: Secondary | ICD-10-CM | POA: Diagnosis not present

## 2015-08-19 DIAGNOSIS — G9589 Other specified diseases of spinal cord: Secondary | ICD-10-CM | POA: Diagnosis not present

## 2015-08-19 DIAGNOSIS — C7951 Secondary malignant neoplasm of bone: Secondary | ICD-10-CM

## 2015-08-19 DIAGNOSIS — R938 Abnormal findings on diagnostic imaging of other specified body structures: Secondary | ICD-10-CM | POA: Insufficient documentation

## 2015-08-19 DIAGNOSIS — M4854XA Collapsed vertebra, not elsewhere classified, thoracic region, initial encounter for fracture: Secondary | ICD-10-CM | POA: Insufficient documentation

## 2015-08-19 MED ORDER — GADOBENATE DIMEGLUMINE 529 MG/ML IV SOLN
15.0000 mL | Freq: Once | INTRAVENOUS | Status: AC | PRN
  Administered 2015-08-19: 12 mL via INTRAVENOUS

## 2015-08-20 ENCOUNTER — Ambulatory Visit: Payer: Medicare Other | Admitting: Occupational Therapy

## 2015-08-20 ENCOUNTER — Ambulatory Visit: Payer: Medicare Other | Admitting: Physical Therapy

## 2015-08-20 ENCOUNTER — Encounter: Payer: Self-pay | Admitting: Radiation Oncology

## 2015-08-20 ENCOUNTER — Ambulatory Visit
Admission: RE | Admit: 2015-08-20 | Discharge: 2015-08-20 | Disposition: A | Discharge status: Home / Self Care | Payer: Medicare Other | Source: Ambulatory Visit | Attending: Radiation Oncology | Admitting: Radiation Oncology

## 2015-08-20 VITALS — BP 140/70 | HR 83 | Temp 98.2°F | Ht 64.0 in | Wt 136.7 lb

## 2015-08-20 DIAGNOSIS — M6281 Muscle weakness (generalized): Secondary | ICD-10-CM | POA: Diagnosis not present

## 2015-08-20 DIAGNOSIS — R2681 Unsteadiness on feet: Secondary | ICD-10-CM | POA: Diagnosis not present

## 2015-08-20 DIAGNOSIS — C7951 Secondary malignant neoplasm of bone: Secondary | ICD-10-CM

## 2015-08-20 DIAGNOSIS — Z7409 Other reduced mobility: Secondary | ICD-10-CM | POA: Diagnosis not present

## 2015-08-20 DIAGNOSIS — R269 Unspecified abnormalities of gait and mobility: Secondary | ICD-10-CM | POA: Diagnosis not present

## 2015-08-20 DIAGNOSIS — C50911 Malignant neoplasm of unspecified site of right female breast: Secondary | ICD-10-CM | POA: Diagnosis not present

## 2015-08-20 DIAGNOSIS — R2689 Other abnormalities of gait and mobility: Secondary | ICD-10-CM

## 2015-08-20 DIAGNOSIS — R293 Abnormal posture: Secondary | ICD-10-CM | POA: Diagnosis not present

## 2015-08-20 NOTE — Progress Notes (Signed)
Ms Dougher here for reassessment. Band of pain across back below scapula to anterior chest. Travel by W/C

## 2015-08-20 NOTE — Therapy (Signed)
[facility] [address] [place], [place], [address] Phone: [phone]   Fax:  [phone]  Physical Therapy Treatment  Patient Details  Name: Heather [name] MRN: [id number] Date of Birth: January 27, 1946 Referring Provider: Alger Simons, MD  Encounter Date: 08/20/2015      PT End of Session - 08/20/15 1500    Visit Number 22   Number of Visits 24   Date for PT Re-Evaluation 08/21/15   PT Start Time 3419   PT Stop Time 1447   PT Time Calculation (min) 39 min   Equipment Utilized During Treatment Gait belt   Activity Tolerance Patient tolerated treatment well   Behavior During Therapy [facility] for tasks assessed/performed      Past Medical History  Diagnosis Date  . Chronic kidney disease   . Hx: UTI (urinary tract infection)   . Right shoulder pain   . Right knee pain   . Hypertension     recently started Aldactone   . Hyperlipidemia     but not on meds;diet and exercise controlled  . Dizziness     has been going on for 32month;medical MD aware  . H/O hiatal hernia   . GERD (gastroesophageal reflux disease)     Tums prn  . Hemorrhoids   . Urinary frequency     d/t taking Aldactone  . History of UTI   . Anemia     hx of  . Cataract     immature-not sure of which eye  . Anxiety     r/t updated surgery  . Insomnia     takes Melatoniin daily  . Breast cancer ([facility]     right  . Skin cancer   . Hx of radiation therapy 10/25/12- 12/11/12    right chest wall/regional lymph nodes 5040 cGy, 28 sessions, right chest wall boost 1000 cGy 1 session  . H. pylori infection   . Metastasis to spinal column (HCC)     T5  . Complication of anesthesia     pt states she is sensitive to meds  . PONV (postoperative nausea and vomiting)     pt experienced hair loss, confusion and combative- 02/15/15    Past Surgical History  Procedure Laterality Date  . Cyst removed from left breast  1970  . Left wrist surgery   2004   with plate  . Colonoscopy    . Esophagogastroduodenoscopy    . Mastectomy, radical    . Mastectomy modified radical  05/10/2012    Procedure: MASTECTOMY MODIFIED RADICAL;  Surgeon: PMerrie Roof MD;  [place] [facility]  Service: General;  Laterality: Right;  RIGHT MODIFIED RADICAL MASTECTOMY  . Portacath placement  05/10/2012    Procedure: INSERTION PORT-A-CATH;  Surgeon: PMerrie Roof MD;  [place] [facility]  Service: General;  Laterality: Left;  . Decompressive lumbar laminectomy level 4 N/A 02/15/2015    Procedure: DECOMPRESSION T5 AND T3-T7 STABALIZATION;  Surgeon: NConsuella Lose MD;  [facility];  Service: Neurosurgery;  Laterality: N/A;  . Laminectomy N/A 03/27/2015    Procedure: Thoracic Four-Thoracic Six Laminectomy for tumor;  Surgeon: NConsuella Lose MD;  [facility];  Service: Neurosurgery;  Laterality: N/A;  T4-T6 Laminectomy    There were no vitals filed for this visit.      Subjective Assessment - 08/20/15 1416    Subjective Had MRI yesterday and meeting with Radiologist today to review results.  Pt reports fall yesterday while transfering  from recliner to w/c that was only locked on one side.  Fell onto bottom.  Was able to get self up with husband bringing different height surfaces to bump up on aerobic step then on hands and knees to foot stool.   Limitations House hold activities;Standing;Walking   How long can you walk comfortably? first time 2/13: 30-40 ft with RW   Patient Stated Goals improve mobility; walk   Currently in Pain? Yes   Pain Score 3    Pain Location Back   Pain Orientation Upper   Pain Descriptors / Indicators Aching   Pain Type Chronic pain   Pain Onset More than a month ago   Pain Frequency Constant   Aggravating Factors  activity   Pain Relieving Factors medication           [facility] Adult PT Treatment/Exercise - 08/20/15 0001    Transfers   Transfers Sit to Stand;Stand to Sit;Stand Pivot Transfers   Sit to Stand  6: Modified independent (Device/Increase time)   Stand to Sit 6: Modified independent (Device/Increase time)   Stand Pivot Transfers 6: Modified independent (Device/Increase time)  with RW   Ambulation/Gait   Ambulation/Gait Yes   Ambulation/Gait Assistance 5: Supervision   Ambulation Distance (Feet) 130 Feet  110 x 2;240'   Assistive device Rolling walker;Other (Comment)  BIL AFO's   Gait Pattern Step-through pattern;Decreased hip/knee flexion - right;Decreased hip/knee flexion - left;Decreased dorsiflexion - right;Decreased dorsiflexion - left;Right genu recurvatum;Left genu recurvatum   Ambulation Surface Level;Unlevel;Indoor;Outdoor;Other (comment)  concrete   Ramp 4: Min assist  min guard assist   Ramp Details (indicate cue type and reason) cues for technique   Curb 4: Min assist   Curb Details (indicate cue type and reason) cues to sequence   Therapeutic Activites    Therapeutic Activities Other Therapeutic Activities   Other Therapeutic Activities standing x 6 minutes 30 seconds for UE task with supervision without UE support                 PT Education - 08/20/15 1452    Education provided Yes   Education Details goals met and renewal   Person(s) Educated Patient   Methods Explanation   Comprehension Verbalized understanding          PT Short Term Goals - 07/24/15 0953    PT SHORT TERM GOAL #1   Title independent with initial HEP (07/24/15)   Status Achieved   PT SHORT TERM GOAL #2   Title ambulate > 18' with RW with min A for improved mobility and household ambulation    Status Achieved   PT SHORT TERM GOAL #3   Title perform sit to/from stand transfers with supervision for improved independence    Status Achieved   PT SHORT TERM GOAL #4   Title improve gait velocity to > 0.5 ft/sec for improved function and mobility   Status Achieved           PT Long Term Goals - 08/20/15 1453    PT LONG TERM GOAL #1   Title ambulate > 100' with supervision  with RW for improved mobility (08/21/15)   Baseline pt able to ambulate with RW x 130' with supervision on 08/20/15   Status Achieved   PT LONG TERM GOAL #2   Title improve gait velocity to > 1.0 ft/sec for improved function and mobility    Baseline 1.71 ft/sec on 08/20/15   Status Achieved   PT LONG TERM GOAL #3  Title stand unsupported x 5 min with supervision for improved lower extremity strength and function    Baseline stood unsupported for UE task x 6 minutes with supervision   Status Achieved   PT LONG TERM GOAL #4   Title perform stand pivot transfers with supervision for improved function and independence   Status Achieved               Plan - 08/20/15 1501    Clinical Impression Statement Pt met all LTG's.  Continues to be motivated to increase mobility and independency.  Endurance improving as no rest breaks required during session.  Renewal per Faustino Congress, PT.   Rehab Potential Good   PT Frequency 3x / week   PT Duration 8 weeks   PT Treatment/Interventions ADLs/Self Care Home Management;Electrical Stimulation;Therapeutic exercise;Therapeutic activities;Functional mobility training;Stair training;Gait training;DME Instruction;Balance training;Neuromuscular re-education;Patient/family education;Orthotic Fit/Training   PT Next Visit Plan Renewal per Faustino Congress, PT.  Continue with POC.  Follow up on MD order for forearm crutches (to be requested by Faustino Congress, PT)   Consulted and Agree with Plan of Care Patient      Patient will benefit from skilled therapeutic intervention in order to improve the following deficits and impairments:  Abnormal gait, Difficulty walking, Pain, Impaired sensation, Decreased strength, Decreased mobility, Decreased balance  Visit Diagnosis: Other abnormalities of gait and mobility     Problem List Patient Active Problem List   Diagnosis Date Noted  . Anemia of chronic disease 05/21/2015  . Metastatic cancer to  spine ([place]) 03/27/2015  . Thrombocytopenia ([place]) 03/12/2015  . Prerenal azotemia 03/12/2015  . Pain   . Recurrent UTI--due to Klebsiella   . Paraplegia at T4 level ([place]) 02/20/2015  . Neurogenic bowel 02/20/2015  . Neurogenic bladder 02/20/2015  . Postoperative anemia due to acute blood loss 02/17/2015  . Spinal cord compression due to malignant neoplasm metastatic to spine ([place]) 02/15/2015  . Epidural mass 02/15/2015  . Thoracic spine tumor   . Pathologic fracture of vertebra 02/14/2015  . Pathologic fracture of thoracic vertebrae 02/14/2015  . Breast cancer metastasized to bone ([place]) 02/14/2015  . Hyperlipidemia 02/14/2015  . H. pylori infection   . HTN (hypertension) 04/17/2012  . Primary cancer of upper outer quadrant of right female breast ([place]) 03/08/2012    Narda Bonds 08/20/2015, 3:04 PM  [facility] [address] [place], [place], [address] Phone: [phone]   Fax:  [phone]  Name: MORINE KOHLMAN MRN: [id number] Date of Birth: 1945/11/09    Narda Bonds, [place] 08/20/2015 3:04 PM Phone: [phone] Fax: [phone]

## 2015-08-21 ENCOUNTER — Encounter: Payer: Self-pay | Admitting: Physical Therapy

## 2015-08-21 DIAGNOSIS — R293 Abnormal posture: Secondary | ICD-10-CM

## 2015-08-21 DIAGNOSIS — R2689 Other abnormalities of gait and mobility: Secondary | ICD-10-CM

## 2015-08-21 DIAGNOSIS — M6281 Muscle weakness (generalized): Secondary | ICD-10-CM

## 2015-08-21 DIAGNOSIS — R2681 Unsteadiness on feet: Secondary | ICD-10-CM

## 2015-08-21 NOTE — Progress Notes (Signed)
Radiation Oncology         [phone] ________________________________  Name: Heather [name] MRN: [id number]  Date: 08/20/2015  DOB: 08-18-1945  Follow-Up Visit Note  CC: Vidal Schwalbe, MD  Consuella Lose, MD  Diagnosis:   Recurrent Metastatic Stage IIB (T2, N1, cM0) ER+/PR-/HER2+ Invasive ductal carcinoma of the right breast with metastatic disease to the thoracic spine.    ICD-9-CM ICD-10-CM   1. Metastatic cancer to spine (HCC) 198.5 C79.51   2. Breast cancer metastasized to bone, right (HCC) 174.9 C50.911    198.5 C79.51     Interval Since Last Radiation:  2  months   04/13/2015-05/19/2015: 55 Gy in 25 fractions of 2.2 Gy to the thoracic spine  10/25/12-12/11/12: 50.4 Gy to the right chest wall and regional lymph nodes in 28 fractions with a 10 Gy boost to the right chest wall in 1 session.  Narrative:  The patient returns today for routine follow-up.  She did undergo an MRI of the thoracic spine which revealed some enhancement along the T5 vertebra, and enhancement along the lateral pedicles of T7. These changes are felt to be consistent with postsurgical and postradiation change rather than active tumor, however this is still a consideration though the tumor if present is significantly diminished, and there is no evidence of compression of her cord.   On review of systems, the patient reports that she is doing extremely well and is quite pleased with her progress with physical therapy. She continues to stripe for being able to ambulate without any assistance, and is currently using crutches with the assistance of the physical therapy.. She denies any difficulty with chest pain or shortness of breath but has experienced some pain in the thoracic spine describes this as soreness mostly noted when she is trying to walk. She denies any headaches, blurred vision, double vision, nausea, vomiting, abdominal pain, fevers or chills. She is slowly increasing her diet as well as her appetite  has been better since the last time we met. She denies any chest pain or shortness of breath. She denies any bowel or bladder dysfunction and is getting to the restroom at home without assistance. A complete review of systems is obtained and is otherwise negative.                              Past Medical History: Past Medical History  Diagnosis Date  . Chronic kidney disease   . Hx: UTI (urinary tract infection)   . Right shoulder pain   . Right knee pain   . Hypertension     recently started Aldactone   . Hyperlipidemia     but not on meds;diet and exercise controlled  . Dizziness     has been going on for 52month;medical MD aware  . H/O hiatal hernia   . GERD (gastroesophageal reflux disease)     Tums prn  . Hemorrhoids   . Urinary frequency     d/t taking Aldactone  . History of UTI   . Anemia     hx of  . Cataract     immature-not sure of which eye  . Anxiety     r/t updated surgery  . Insomnia     takes Melatoniin daily  . Breast cancer ([facility]     right  . Skin cancer   . Hx of radiation therapy 10/25/12- 12/11/12    right chest wall/regional lymph nodes 5040 cGy,  28 sessions, right chest wall boost 1000 cGy 1 session  . H. pylori infection   . Metastasis to spinal column (HCC)     T5  . Complication of anesthesia     pt states she is sensitive to meds  . PONV (postoperative nausea and vomiting)     pt experienced hair loss, confusion and combative- 02/15/15   Surgical History:  Past Surgical History  Procedure Laterality Date  . Cyst removed from left breast  1970  . Left wrist surgery   2004    with plate  . Colonoscopy    . Esophagogastroduodenoscopy    . Mastectomy, radical    . Mastectomy modified radical  05/10/2012    Procedure: MASTECTOMY MODIFIED RADICAL;  Surgeon: Merrie Roof, MD;  Location: [facility];  Service: General;  Laterality: Right;  RIGHT MODIFIED RADICAL MASTECTOMY  . Portacath placement  05/10/2012    Procedure: INSERTION PORT-A-CATH;  Surgeon:  Merrie Roof, MD;  Location: [facility];  Service: General;  Laterality: Left;  . Decompressive lumbar laminectomy level 4 N/A 02/15/2015    Procedure: DECOMPRESSION T5 AND T3-T7 STABALIZATION;  Surgeon: Consuella Lose, MD;  Location: [place] [facility];  Service: Neurosurgery;  Laterality: N/A;  . Laminectomy N/A 03/27/2015    Procedure: Thoracic Four-Thoracic Six Laminectomy for tumor;  Surgeon: Consuella Lose, MD;  Location: [facility];  Service: Neurosurgery;  Laterality: N/A;  T4-T6 Laminectomy   Social History:  Social History   Social History  . Marital Status: Married    Spouse Name: N/A  . Number of Children: 2  . Years of Education: N/A   Occupational History  . Not on file.   Social History Main Topics  . Smoking status: Never Smoker   . Smokeless tobacco: Never Used  . Alcohol Use: No  . Drug Use: No  . Sexual Activity: Yes    Birth Control/ Protection: Post-menopausal   Other Topics Concern  . Not on file   Social History Narrative  She is married and accompanied by her husband today.  Family  History:  Family History  Problem Relation Age of Onset  . Leukemia Mother   . Colon cancer Brother   . Cancer Brother     Prostate  . Colon cancer Maternal Grandmother   . Stroke Father   . Diabetes Mellitus II Father      ALLERGIES:  is allergic to codeine; keflex; naprosyn; oxycodone; and tussin.  Meds: Current Outpatient Prescriptions  Medication Sig Dispense Refill  . aspirin 81 MG tablet Take 81 mg by mouth daily.    Marland Kitchen b complex vitamins tablet Take 1 tablet by mouth daily.    . Calcium Carbonate Antacid (TUMS PO) Take 2 tablets by mouth 2 (two) times daily as needed (acid reflux).    . Calcium Carbonate-Vitamin D (CALCIUM-D PO) Take 1 tablet by mouth daily. Reported on 06/25/2015    . Cholecalciferol (VITAMIN D) 2000 UNITS tablet Take 2,000 Units by mouth daily.    . Coenzyme Q10 (COQ10) 100 MG CAPS Take 100 mg by mouth at bedtime.    Marland Kitchen exemestane  (AROMASIN) 25 MG tablet Take 1 tablet (25 mg total) by mouth daily after breakfast. 90 tablet 3  . gabapentin (NEURONTIN) 600 MG tablet Take 1 tablet (600 mg total) by mouth 3 (three) times daily. 90 tablet 3  . letrozole (FEMARA) 2.5 MG tablet Take 1 tablet (2.5 mg total) by mouth daily. 90 tablet 1  . lidocaine-prilocaine (  EMLA) cream Apply to affected area once (Patient taking differently: Apply 1 application topically every 21 ( twenty-one) days. Apply to port prior to infusions) 30 g 3  . methocarbamol (ROBAXIN) 500 MG tablet Take 1 tablet (500 mg total) by mouth daily as needed for muscle spasms. 75 tablet 0  . OVER THE COUNTER MEDICATION Place 1 drop into both eyes 2 (two) times daily as needed (dry eyes). Over the counter lubricating eye drops    . polyethylene glycol (MIRALAX / GLYCOLAX) packet Take 17 g by mouth 2 (two) times daily. (Patient taking differently: Take 8.5 g by mouth daily as needed (constipation). ) 100 each 0  . pravastatin (PRAVACHOL) 10 MG tablet Take 10 mg by mouth at bedtime.     . Probiotic Product (PROBIOTIC DAILY PO) Take 1 capsule by mouth 2 (two) times daily.     . prochlorperazine (COMPAZINE) 10 MG tablet Take 1 tablet (10 mg total) by mouth every 6 (six) hours as needed for nausea or vomiting. 30 tablet 0  . ranitidine (ZANTAC) 300 MG tablet Take 300 mg by mouth at bedtime. Reported on 06/25/2015    . spironolactone (ALDACTONE) 25 MG tablet Take 0.5 tablets (12.5 mg total) by mouth daily. 45 tablet 12  . traMADol (ULTRAM) 50 MG tablet Take 1 tablet (50 mg total) by mouth every 6 (six) hours as needed for moderate pain. 60 tablet 0  . Wound Cleansers (RADIAPLEX EX) Apply topically. Reported on 06/25/2015    . apixaban (ELIQUIS) 2.5 MG TABS tablet Take 1 tablet (2.5 mg total) by mouth 2 (two) times daily. (Patient not taking: Reported on 08/11/2015) 180 tablet 0   No current facility-administered medications for this encounter.    Physical Findings:  height is 5' 4"  (1.626 m) and weight is 136 lb 11.2 oz (62.007 kg). Her temperature is 98.2 F (36.8 C). Her blood pressure is 140/70 and her pulse is 83.   Pain scale 2/10 In general this is a well appearing Caucasian female in no acute distress. She's alert and oriented x4 and appropriate throughout the examination. Cardiopulmonary assessment is negative for acute distress and she exhibits normal effort. The patient is able to demonstrate her range of motion of her lower extremities, and A. fib with her braces, she has been more easily able to stand and any clinical fashion of this is not tested.   Lab Findings: Lab Results  Component Value Date   WBC 4.2 08/13/2015   WBC 6.4 04/09/2015   HGB 11.2* 08/13/2015   HGB 10.1* 04/09/2015   HCT 35.2 08/13/2015   HCT 33.0* 04/09/2015   PLT 169 08/13/2015   PLT 243 04/09/2015    Lab Results  Component Value Date   NA 139 08/13/2015   NA 135 03/28/2015   K 4.0 08/13/2015   K 3.8 03/28/2015   CHLORIDE 105 08/13/2015   CO2 29 08/13/2015   CO2 28 03/28/2015   GLUCOSE 98 08/13/2015   GLUCOSE 159* 03/28/2015   GLUCOSE 90 10/25/2012   BUN 13.1 08/13/2015   BUN 24* 03/28/2015   CREATININE 0.8 08/13/2015   CREATININE 0.78 03/28/2015   BILITOT 0.32 08/13/2015   BILITOT 0.9 03/27/2015   ALKPHOS 70 08/13/2015   ALKPHOS 124 03/27/2015   AST 27 08/13/2015   AST 42* 03/27/2015   ALT 24 08/13/2015   ALT 136* 03/27/2015   PROT 6.7 08/13/2015   PROT 6.0* 03/27/2015   ALBUMIN 3.4* 08/13/2015   ALBUMIN 3.4* 03/27/2015  CALCIUM 9.6 08/13/2015   CALCIUM 7.8* 03/28/2015   ANIONGAP 5 08/13/2015   ANIONGAP 8 03/28/2015    Radiographic Findings: Mr Thoracic Spine W Wo Contrast  08/19/2015  CLINICAL DATA:  Metastatic breast cancer to bone. Weakness in both legs with pain in the upper back and under the left breast. Known metastasis to the T5 level. EXAM: MRI THORACIC SPINE WITHOUT AND WITH CONTRAST TECHNIQUE: Multiplanar and multiecho pulse sequences of the  thoracic spine were obtained without and with intravenous contrast. CONTRAST:  80m MULTIHANCE GADOBENATE DIMEGLUMINE 529 MG/ML IV SOLN COMPARISON:  Radiographs dated 04/09/2015, CT scan dated 03/30/2015 and MRIs dated 03/05/2015 and 02/14/2015 FINDINGS: The patient has posterior fusion from T3-T7. The tumor in the T5 vertebra has markedly diminished. There is no longer compression of the spinal cord by tumor 0 0 30 is increased anterolisthesis of the upper thoracic spine with respect at T5 in the distal thoracic spine due to the destruction of the T5 vertebra. This is creates an angulation in the spinal canal. There is abnormal signal and atrophy of the spinal cord at T5 consistent with myelopathy and myelomalacia. There is abnormal signal in the transverse processes of T5 bilaterally with abnormal enhancement of those areas as well as abnormal epidural enhancement around the thecal sac at T5. This could represent scarring after treatment or residual tumor. There is new abnormal signal in the right side of the T7 vertebral body which may be secondary to the pedicle screw which appears to protrude slightly through the lateral cortex of T7. However, this could represent tumor. There is progressive collapse of the remnants of the T5 vertebra particularly anteriorly which accounts for the angulation. The remainder the thoracic spinal cord and spinal canal appear normal. Degenerative changes of the inferior endplate of L2. Subtle area of abnormal signal in the inferior endplate of TT46to the left of midline is unchanged and felt unlikely to represent metastasis. IMPRESSION: 1. Further collapse of the T5 vertebra with increased anterolisthesis at that level. Increased angulation at that site. 2. Marked reduction of tumor with no residual impingement of tumor a upon the spinal cord. 3. Interval development of myelopathy and myelomalacia at the T5 level. 4. Persistent abnormal soft tissue in the epidural space at T5  extending into the pedicles and transverse processes of T5. This could represent scar tissue or residual tumor. 5. Abnormal signal in the right posterior lateral aspect of the T7 vertebra which could represent tumor or bone reaction due to the pedicle screw at that site. There is similar but slightly less prominent edema in the left side of the T7 vertebra adjacent to the pedicle screw. Electronically Signed   By: JLorriane ShireM.D.   On: 08/19/2015 12:34    Impression/Plan: 1. Recurrent Metastatic Stage IIB (T2, N1, cM0) ER+/PR-/HER2+ Invasive ductal carcinoma of the right breast with metastatic disease to the thoracic spine. The patient underwent 2 surgeries for decompression and stabilization as well as radiotherapy to the thoracic spine. The patient appears to be doing well clinically noticed improvement in her functionality since her last visit which we're very pleased by. She continues to work aggressively with physical therapy. Radiologically, Dr. MTammi Klippelhas reviewed her films and feels as though the areas of enhancement represent postsurgical or postradiation change rather than concerns for disease at this point. He would recommend repeating an MRI of the thoracic spine about 3 months time. Her case will be discussed attumor board, and I will contact her if I hear  anything different prior from Dr. Kathyrn Sheriff. She states agreement and understanding of this. She will also continue to follow up with [name] and has plans to undergo repeat imaging of the chest abdomen and pelvis in the next 2 weeks. She will contact us if she has any questions or concerns prior to her next visit 3 months 2. Deconditioning secondary to #1. The patient is working aggressively with the physical therapy and is very successful left far. Her sensation is diminished is stable since her last visit, and she is just hoping to regain more motor function and strength. We will continue to follow this closely and are very pleased  with how well she is doing thusfar.  The above documentation reflects my direct findings during this shared patient visit. Please see the separate note by Dr. [name] on this date for the remainder of the patient's plan of care.    Carola Rhine, PAC

## 2015-08-24 ENCOUNTER — Encounter: Payer: Self-pay | Admitting: Physical Therapy

## 2015-08-24 ENCOUNTER — Telehealth: Payer: Self-pay | Admitting: Radiation Oncology

## 2015-08-24 ENCOUNTER — Ambulatory Visit: Payer: Medicare Other | Admitting: Physical Therapy

## 2015-08-24 DIAGNOSIS — R293 Abnormal posture: Secondary | ICD-10-CM | POA: Diagnosis not present

## 2015-08-24 DIAGNOSIS — R269 Unspecified abnormalities of gait and mobility: Secondary | ICD-10-CM | POA: Diagnosis not present

## 2015-08-24 DIAGNOSIS — R2689 Other abnormalities of gait and mobility: Secondary | ICD-10-CM

## 2015-08-24 DIAGNOSIS — R2681 Unsteadiness on feet: Secondary | ICD-10-CM | POA: Diagnosis not present

## 2015-08-24 DIAGNOSIS — Z7409 Other reduced mobility: Secondary | ICD-10-CM | POA: Diagnosis not present

## 2015-08-24 DIAGNOSIS — M6281 Muscle weakness (generalized): Secondary | ICD-10-CM | POA: Diagnosis not present

## 2015-08-24 NOTE — Telephone Encounter (Signed)
LM for pt regarding brain conference's recommendation for repeat MRI in 3 months.

## 2015-08-24 NOTE — Therapy (Signed)
[facility] [address] [place], [place], [address] Phone: [phone]   Fax:  [phone]  Physical Therapy Treatment  Patient Details  Name: Heather [name] MRN: [id number] Date of Birth: May 27, 1945 Referring Provider: Alger Simons, MD  Encounter Date: 08/24/2015      PT End of Session - 08/24/15 0929    Visit Number 23   Number of Visits 24   Date for PT Re-Evaluation 08/21/15   PT Start Time 0845   PT Stop Time 0925   PT Time Calculation (min) 40 min   Equipment Utilized During Treatment Gait belt   Activity Tolerance Patient tolerated treatment well   Behavior During Therapy [facility] for tasks assessed/performed      Past Medical History  Diagnosis Date  . Chronic kidney disease   . Hx: UTI (urinary tract infection)   . Right shoulder pain   . Right knee pain   . Hypertension     recently started Aldactone   . Hyperlipidemia     but not on meds;diet and exercise controlled  . Dizziness     has been going on for 55month;medical MD aware  . H/O hiatal hernia   . GERD (gastroesophageal reflux disease)     Tums prn  . Hemorrhoids   . Urinary frequency     d/t taking Aldactone  . History of UTI   . Anemia     hx of  . Cataract     immature-not sure of which eye  . Anxiety     r/t updated surgery  . Insomnia     takes Melatoniin daily  . Breast cancer ([place]     right  . Skin cancer   . Hx of radiation therapy 10/25/12- 12/11/12    right chest wall/regional lymph nodes 5040 cGy, 28 sessions, right chest wall boost 1000 cGy 1 session  . H. pylori infection   . Metastasis to spinal column (HCC)     T5  . Complication of anesthesia     pt states she is sensitive to meds  . PONV (postoperative nausea and vomiting)     pt experienced hair loss, confusion and combative- 02/15/15    Past Surgical History  Procedure Laterality Date  . Cyst removed from left breast  1970  . Left wrist surgery   2004     with plate  . Colonoscopy    . Esophagogastroduodenoscopy    . Mastectomy, radical    . Mastectomy modified radical  05/10/2012    Procedure: MASTECTOMY MODIFIED RADICAL;  Surgeon: PMerrie Roof MD;  [place] [facility]  Service: General;  Laterality: Right;  RIGHT MODIFIED RADICAL MASTECTOMY  . Portacath placement  05/10/2012    Procedure: INSERTION PORT-A-CATH;  Surgeon: PMerrie Roof MD;  [place] [facility]  Service: General;  Laterality: Left;  . Decompressive lumbar laminectomy level 4 N/A 02/15/2015    Procedure: DECOMPRESSION T5 AND T3-T7 STABALIZATION;  Surgeon: NConsuella Lose MD;  [facility];  Service: Neurosurgery;  Laterality: N/A;  . Laminectomy N/A 03/27/2015    Procedure: Thoracic Four-Thoracic Six Laminectomy for tumor;  Surgeon: NConsuella Lose MD;  [facility];  Service: Neurosurgery;  Laterality: N/A;  T4-T6 Laminectomy    There were no vitals filed for this visit.      Subjective Assessment - 08/24/15 0850    Subjective MRI report was good. No falls since last visit.  Walked outside with supervision this weekend.  Currently in Pain? No/denies                         [facility] Adult PT Treatment/Exercise - 08/24/15 0001    Knee/Hip Exercises: Standing   Marching Strengthening;Both;1 set;10 reps  ALT with 3 sec hold   Hip Flexion (SLR) AROM;Stengthening;Both;10 reps   Hip Abduction AROM;Stengthening;Both;10 reps  ALT with 3 sec. holds.   Hip Extension AROM;Both;10 reps  ALT with 3 sec hold             Balance Exercises - 08/24/15 0852    Balance Exercises: Standing   Other Standing Exercises standing balance with multilevel reaching- 1 UE support Mod I  No UE support- trunk rotation and elevated reaching Min guard           PT Education - 08/24/15 0910    Education provided Yes   Education Details Balance strategies for standing with decreased UE support.   Methods Explanation;Demonstration;Tactile  cues;Verbal cues   Comprehension Verbalized understanding;Need further instruction          PT Short Term Goals - 07/24/15 0953    PT SHORT TERM GOAL #1   Title independent with initial HEP (07/24/15)   Status Achieved   PT SHORT TERM GOAL #2   Title ambulate > 29' with RW with min A for improved mobility and household ambulation    Status Achieved   PT SHORT TERM GOAL #3   Title perform sit to/from stand transfers with supervision for improved independence    Status Achieved   PT SHORT TERM GOAL #4   Title improve gait velocity to > 0.5 ft/sec for improved function and mobility   Status Achieved           PT Long Term Goals - 08/20/15 1453    PT LONG TERM GOAL #1   Title ambulate > 100' with supervision with RW for improved mobility (08/21/15)   Baseline pt able to ambulate with RW x 130' with supervision on 08/20/15   Status Achieved   PT LONG TERM GOAL #2   Title improve gait velocity to > 1.0 ft/sec for improved function and mobility    Baseline 1.71 ft/sec on 08/20/15   Status Achieved   PT LONG TERM GOAL #3   Title stand unsupported x 5 min with supervision for improved lower extremity strength and function    Baseline stood unsupported for UE task x 6 minutes with supervision   Status Achieved   PT LONG TERM GOAL #4   Title perform stand pivot transfers with supervision for improved function and independence   Status Achieved               Plan - 08/24/15 0914    Clinical Impression Statement Skilled session focused  on standing balance- pt requires Min guard wtihout UE support and Mod I with 1 UE support.  Progressing well with standing endurance.   Rehab Potential Good   PT Frequency 3x / week   PT Duration 8 weeks   PT Treatment/Interventions ADLs/Self Care Home Management;Electrical Stimulation;Therapeutic exercise;Therapeutic activities;Functional mobility training;Stair training;Gait training;DME Instruction;Balance training;Neuromuscular  re-education;Patient/family education;Orthotic Fit/Training   PT Next Visit Plan .Follow up on MD order for forearm crutches (to be requested by Faustino Congress, PT), dynamic gait and LE strengthening.   Consulted and Agree with Plan of Care Patient      Patient will benefit from skilled therapeutic intervention in order to improve the following deficits  and impairments:  Abnormal gait, Difficulty walking, Pain, Impaired sensation, Decreased strength, Decreased mobility, Decreased balance  Visit Diagnosis: No diagnosis found.     Problem List Patient Active Problem List   Diagnosis Date Noted  . Anemia of chronic disease 05/21/2015  . Metastatic cancer to spine ([place]) 03/27/2015  . Thrombocytopenia ([place]) 03/12/2015  . Prerenal azotemia 03/12/2015  . Pain   . Recurrent UTI--due to Klebsiella   . Paraplegia at T4 level ([place]) 02/20/2015  . Neurogenic bowel 02/20/2015  . Neurogenic bladder 02/20/2015  . Postoperative anemia due to acute blood loss 02/17/2015  . Spinal cord compression due to malignant neoplasm metastatic to spine ([place]) 02/15/2015  . Epidural mass 02/15/2015  . Thoracic spine tumor   . Pathologic fracture of vertebra 02/14/2015  . Pathologic fracture of thoracic vertebrae 02/14/2015  . Breast cancer metastasized to bone ([place]) 02/14/2015  . Hyperlipidemia 02/14/2015  . H. pylori infection   . HTN (hypertension) 04/17/2012  . Primary cancer of upper outer quadrant of right female breast ([place]) 03/08/2012    Bjorn Loser, PTA  08/24/2015, 9:37 AM [facility] [address] [place], [place], [address] Phone: [phone]   Fax:  [phone]  Name: Heather [name] MRN: [id number] Date of Birth: 02/08/1946

## 2015-08-25 ENCOUNTER — Telehealth: Payer: Self-pay | Admitting: Radiation Oncology

## 2015-08-25 NOTE — Telephone Encounter (Signed)
I called the patient to f/u on the consensus and that still stands for close follow up MRI in 3 months.

## 2015-08-26 ENCOUNTER — Ambulatory Visit: Payer: Medicare Other | Admitting: Physical Therapy

## 2015-08-26 ENCOUNTER — Encounter: Payer: Medicare Other | Admitting: Occupational Therapy

## 2015-08-26 DIAGNOSIS — M6281 Muscle weakness (generalized): Secondary | ICD-10-CM | POA: Diagnosis not present

## 2015-08-26 DIAGNOSIS — R2689 Other abnormalities of gait and mobility: Secondary | ICD-10-CM

## 2015-08-26 DIAGNOSIS — R269 Unspecified abnormalities of gait and mobility: Secondary | ICD-10-CM | POA: Diagnosis not present

## 2015-08-26 DIAGNOSIS — Z7409 Other reduced mobility: Secondary | ICD-10-CM | POA: Diagnosis not present

## 2015-08-26 DIAGNOSIS — R2681 Unsteadiness on feet: Secondary | ICD-10-CM | POA: Diagnosis not present

## 2015-08-26 DIAGNOSIS — R293 Abnormal posture: Secondary | ICD-10-CM | POA: Diagnosis not present

## 2015-08-26 NOTE — Therapy (Signed)
[facility] [address] [place], [place], [address] Phone: [phone]   Fax:  [phone]  Physical Therapy Treatment  Patient Details  Name: Heather [name] MRN: [id number] Date of Birth: [birth year] Referring Provider: Alger Simons, MD  Encounter Date: 08/26/2015      PT End of Session - 08/26/15 2038    Visit Number 24   Number of Visits 24   Date for PT Re-Evaluation 08/21/15   PT Start Time 0936   PT Stop Time 1016   PT Time Calculation (min) 40 min   Equipment Utilized During Treatment Gait belt   Activity Tolerance Patient tolerated treatment well   Behavior During Therapy [facility] for tasks assessed/performed      Past Medical History  Diagnosis Date  . Chronic kidney disease   . Hx: UTI (urinary tract infection)   . Right shoulder pain   . Right knee pain   . Hypertension     recently started Aldactone   . Hyperlipidemia     but not on meds;diet and exercise controlled  . Dizziness     has been going on for 28month;medical MD aware  . H/O hiatal hernia   . GERD (gastroesophageal reflux disease)     Tums prn  . Hemorrhoids   . Urinary frequency     d/t taking Aldactone  . History of UTI   . Anemia     hx of  . Cataract     immature-not sure of which eye  . Anxiety     r/t updated surgery  . Insomnia     takes Melatoniin daily  . Breast cancer ([place]     right  . Skin cancer   . Hx of radiation therapy 10/25/12- 12/11/12    right chest wall/regional lymph nodes 5040 cGy, 28 sessions, right chest wall boost 1000 cGy 1 session  . H. pylori infection   . Metastasis to spinal column (HCC)     T5  . Complication of anesthesia     pt states she is sensitive to meds  . PONV (postoperative nausea and vomiting)     pt experienced hair loss, confusion and combative- 02/15/15    Past Surgical History  Procedure Laterality Date  . Cyst removed from left breast  1970  . Left wrist surgery   2004   with plate  . Colonoscopy    . Esophagogastroduodenoscopy    . Mastectomy, radical    . Mastectomy modified radical  05/10/2012    Procedure: MASTECTOMY MODIFIED RADICAL;  Surgeon: PMerrie Roof MD;  [facility]  Service: General;  Laterality: Right;  RIGHT MODIFIED RADICAL MASTECTOMY  . Portacath placement  05/10/2012    Procedure: INSERTION PORT-A-CATH;  Surgeon: PMerrie Roof MD;  [facility]  Service: General;  Laterality: Left;  . Decompressive lumbar laminectomy level 4 N/A 02/15/2015    Procedure: DECOMPRESSION T5 AND T3-T7 STABALIZATION;  Surgeon: NConsuella Lose MD;  [facility];  Service: Neurosurgery;  Laterality: N/A;  . Laminectomy N/A 03/27/2015    Procedure: Thoracic Four-Thoracic Six Laminectomy for tumor;  Surgeon: NConsuella Lose MD;  [facility];  Service: Neurosurgery;  Laterality: N/A;  T4-T6 Laminectomy    There were no vitals filed for this visit.      Subjective Assessment - 08/26/15 2027    Subjective "I want to work on the crutches.  I didnt get to do that last time and really  want to get better with those."  Denies falls or other changes.   Limitations House hold activities;Standing;Walking   How long can you walk comfortably? first time 2/13: 30-40 ft with RW   Patient Stated Goals improve mobility; walk   Currently in Pain? No/denies            [facility] PT Assessment - 08/26/15 0001    Strength   Overall Strength Comments grossly tested as below   Strength Assessment Site Knee   Right Knee Flexion 3+/5   Right Knee Extension 4/5   Left Knee Flexion 4-/5   Left Knee Extension 5/5   Right Ankle Dorsiflexion 4-/5   Left Ankle Dorsiflexion 4/5         [facility] Adult PT Treatment/Exercise - 08/26/15 0001    Transfers   Transfers Sit to Stand;Stand to Sit;Stand Pivot Transfers   Sit to Stand 6: Modified independent (Device/Increase time)   Stand to Sit 6: Modified independent (Device/Increase time)   Comments Min  guard assist when using forearm crutches to get crutches positioned   Ambulation/Gait   Ambulation/Gait Yes   Ambulation/Gait Assistance 5: Supervision;4: Min assist;4: Min guard   Ambulation Distance (Feet) 340 Feet  x 1 with forearm crutches and 110 x 2, 80 x 1 with RW   Assistive device Rolling walker;R Forearm Crutch;L Forearm Crutch   Gait Pattern Step-through pattern;Decreased hip/knee flexion - right;Decreased hip/knee flexion - left;Decreased dorsiflexion - right;Decreased dorsiflexion - left;Right genu recurvatum;Left genu recurvatum   Ambulation Surface Level;Indoor   Gait Comments Pt needing cues to sequence with bil forearm crutches and to decrease step length at turns for safety.  Pt needed min assist at times with forearm crutches for balance.  Trialed gait without L AFO and RW with min assist.  Pt able to clear L foot but still with some L knee hyperextension.  Pt with improved hip flexion on L without AFO due to being able to place L foot flat during step thru on L.  Discussed having [employer] come back and look at pt for possible other options for L knee genu recurvatum           PT Education - 08/26/15 2036    Education provided Yes   Education Details Possibly contacting [employer] to discuss other options for L AFO now that LLE strenth has improved, holding on forearm crutches until discuss AFO's with [employer] so as to not make too many changes at once   Person(s) Educated Patient   Methods Explanation   Comprehension Verbalized understanding          PT Short Term Goals - 07/24/15 0953    PT SHORT TERM GOAL #1   Title independent with initial HEP (07/24/15)   Status Achieved   PT SHORT TERM GOAL #2   Title ambulate > 76' with RW with min A for improved mobility and household ambulation    Status Achieved   PT SHORT TERM GOAL #3   Title perform sit to/from stand transfers with supervision for improved independence    Status Achieved   PT SHORT TERM GOAL #4   Title  improve gait velocity to > 0.5 ft/sec for improved function and mobility   Status Achieved           PT Long Term Goals - 08/20/15 1453    PT LONG TERM GOAL #1   Title ambulate > 100' with supervision with RW for improved mobility (08/21/15)   Baseline pt able to ambulate  with RW x 130' with supervision on 08/20/15   Status Achieved   PT LONG TERM GOAL #2   Title improve gait velocity to > 1.0 ft/sec for improved function and mobility    Baseline 1.71 ft/sec on 08/20/15   Status Achieved   PT LONG TERM GOAL #3   Title stand unsupported x 5 min with supervision for improved lower extremity strength and function    Baseline stood unsupported for UE task x 6 minutes with supervision   Status Achieved   PT LONG TERM GOAL #4   Title perform stand pivot transfers with supervision for improved function and independence   Status Achieved               Plan - 08/26/15 2039    Clinical Impression Statement Pt with significant improvement in LLE strength and able to ambulate without L AFO with improved clearance on L but continued L knee genu recurvatum.  No knee buckling without AFO on L.  Continue PT per POC.   Rehab Potential Good   PT Frequency 3x / week   PT Duration 8 weeks   PT Treatment/Interventions ADLs/Self Care Home Management;Electrical Stimulation;Therapeutic exercise;Therapeutic activities;Functional mobility training;Stair training;Gait training;DME Instruction;Balance training;Neuromuscular re-education;Patient/family education;Orthotic Fit/Training   PT Next Visit Plan Renewal per Faustino Congress, PT.  Contact [employer] to discuss AFO options/changes.  Continue LE strength and gait.   Consulted and Agree with Plan of Care Patient      Patient will benefit from skilled therapeutic intervention in order to improve the following deficits and impairments:  Abnormal gait, Difficulty walking, Pain, Impaired sensation, Decreased strength, Decreased mobility, Decreased  balance  Visit Diagnosis: Other abnormalities of gait and mobility     Problem List Patient Active Problem List   Diagnosis Date Noted  . Anemia of chronic disease 05/21/2015  . Metastatic cancer to spine ([place]) 03/27/2015  . Thrombocytopenia ([place]) 03/12/2015  . Prerenal azotemia 03/12/2015  . Pain   . Recurrent UTI--due to Klebsiella   . Paraplegia at T4 level ([place]) 02/20/2015  . Neurogenic bowel 02/20/2015  . Neurogenic bladder 02/20/2015  . Postoperative anemia due to acute blood loss 02/17/2015  . Spinal cord compression due to malignant neoplasm metastatic to spine ([place]) 02/15/2015  . Epidural mass 02/15/2015  . Thoracic spine tumor   . Pathologic fracture of vertebra 02/14/2015  . Pathologic fracture of thoracic vertebrae 02/14/2015  . Breast cancer metastasized to bone ([place]) 02/14/2015  . Hyperlipidemia 02/14/2015  . H. pylori infection   . HTN (hypertension) 04/17/2012  . Primary cancer of upper outer quadrant of right female breast ([place]) 03/08/2012    Narda Bonds 08/26/2015, 8:42 PM  [facility] [address] [place], [place], [address] Phone: [phone]   Fax:  [phone]  Name: Heather [name] MRN: [id number] Date of Birth: 1945/05/26    Narda Bonds, [place] 08/26/2015 8:43 PM Phone: [phone] Fax: [phone]

## 2015-08-27 ENCOUNTER — Encounter: Payer: Self-pay | Admitting: Physical Therapy

## 2015-08-27 ENCOUNTER — Encounter: Payer: Medicare Other | Admitting: Occupational Therapy

## 2015-08-27 ENCOUNTER — Ambulatory Visit: Payer: Medicare Other | Admitting: Physical Therapy

## 2015-08-27 DIAGNOSIS — R293 Abnormal posture: Secondary | ICD-10-CM | POA: Diagnosis not present

## 2015-08-27 DIAGNOSIS — R2681 Unsteadiness on feet: Secondary | ICD-10-CM

## 2015-08-27 DIAGNOSIS — R269 Unspecified abnormalities of gait and mobility: Secondary | ICD-10-CM | POA: Diagnosis not present

## 2015-08-27 DIAGNOSIS — Z7409 Other reduced mobility: Secondary | ICD-10-CM | POA: Diagnosis not present

## 2015-08-27 DIAGNOSIS — M6281 Muscle weakness (generalized): Secondary | ICD-10-CM | POA: Diagnosis not present

## 2015-08-27 DIAGNOSIS — R2689 Other abnormalities of gait and mobility: Secondary | ICD-10-CM

## 2015-08-27 NOTE — Therapy (Signed)
[facility] [address] [place], [place], [address] Phone: [phone]   Fax:  [phone]  Physical Therapy Treatment  Patient Details  Name: Heather [name] MRN: [id number] Date of Birth: 07-Nov-1945 Referring Provider: Alger Simons, MD  Encounter Date: 08/27/2015      PT End of Session - 08/27/15 1553    Visit Number 25   Number of Visits 24   Date for PT Re-Evaluation 08/21/15   PT Start Time 1020   PT Stop Time 1100   PT Time Calculation (min) 40 min   Equipment Utilized During Treatment Gait belt   Activity Tolerance Patient tolerated treatment well   Behavior During Therapy [facility] for tasks assessed/performed      Past Medical History  Diagnosis Date  . Chronic kidney disease   . Hx: UTI (urinary tract infection)   . Right shoulder pain   . Right knee pain   . Hypertension     recently started Aldactone   . Hyperlipidemia     but not on meds;diet and exercise controlled  . Dizziness     has been going on for 24month;medical MD aware  . H/O hiatal hernia   . GERD (gastroesophageal reflux disease)     Tums prn  . Hemorrhoids   . Urinary frequency     d/t taking Aldactone  . History of UTI   . Anemia     hx of  . Cataract     immature-not sure of which eye  . Anxiety     r/t updated surgery  . Insomnia     takes Melatoniin daily  . Breast cancer ([place]     right  . Skin cancer   . Hx of radiation therapy 10/25/12- 12/11/12    right chest wall/regional lymph nodes 5040 cGy, 28 sessions, right chest wall boost 1000 cGy 1 session  . H. pylori infection   . Metastasis to spinal column (HCC)     T5  . Complication of anesthesia     pt states she is sensitive to meds  . PONV (postoperative nausea and vomiting)     pt experienced hair loss, confusion and combative- 02/15/15    Past Surgical History  Procedure Laterality Date  . Cyst removed from left breast  1970  . Left wrist surgery   2004   with plate  . Colonoscopy    . Esophagogastroduodenoscopy    . Mastectomy, radical    . Mastectomy modified radical  05/10/2012    Procedure: MASTECTOMY MODIFIED RADICAL;  Surgeon: PMerrie Roof MD;  [place] [facility]  Service: General;  Laterality: Right;  RIGHT MODIFIED RADICAL MASTECTOMY  . Portacath placement  05/10/2012    Procedure: INSERTION PORT-A-CATH;  Surgeon: PMerrie Roof MD;  [place] [facility]  Service: General;  Laterality: Left;  . Decompressive lumbar laminectomy level 4 N/A 02/15/2015    Procedure: DECOMPRESSION T5 AND T3-T7 STABALIZATION;  Surgeon: NConsuella Lose MD;  [place] [facility];  Service: Neurosurgery;  Laterality: N/A;  . Laminectomy N/A 03/27/2015    Procedure: Thoracic Four-Thoracic Six Laminectomy for tumor;  Surgeon: NConsuella Lose MD;  [facility];  Service: Neurosurgery;  Laterality: N/A;  T4-T6 Laminectomy    There were no vitals filed for this visit.      Subjective Assessment - 08/27/15 1024    Subjective No falls since last visit.   Limitations House hold activities;Standing;Walking   How long can you walk  comfortably? first time 2/13: 30-40 ft with RW   Patient Stated Goals improve mobility; walk   Currently in Pain? No/denies            [facility] PT Assessment - 08/27/15 1140    Transfers   Transfers Sit to Stand;Stand to Sit;Stand Pivot Transfers   Sit to Stand 6: Modified independent (Device/Increase time)   Stand to Sit 6: Modified independent (Device/Increase time)   Stand Pivot Transfers 6: Modified independent (Device/Increase time)  with RW   Ambulation/Gait   Ambulation/Gait Yes   Ambulation Distance (Feet) 130 Feet  110 x 2;240'   Gait Pattern Step-through pattern;Decreased hip/knee flexion - right;Decreased hip/knee flexion - left;Decreased dorsiflexion - right;Decreased dorsiflexion - left;Right genu recurvatum;Left genu recurvatum   Curb 4: Min assist                     [facility] Adult PT  Treatment/Exercise - 08/27/15 1140    Ambulation/Gait   Ambulation/Gait Assistance 4: Min guard;2: Max assist  Max A with LOB amb. on level surface, with multiple distrac   Assistive device R Forearm Crutch;L Forearm Crutch   Ramp 4: Min assist   Therapeutic Activites    Therapeutic Activities Other Therapeutic Activities   Other Therapeutic Activities standing x 6 minutes 30 seconds for UE task with supervision without UE support                 PT Education - 08/27/15 1059    Education provided Yes   Education Details Safety concerns with gait and the need for continued close Supervision.   Person(s) Educated Patient   Methods Explanation   Comprehension Verbalized understanding;Need further instruction          PT Short Term Goals - 08/27/15 1142    PT SHORT TERM GOAL #5   Title amb > 300' with LRAD with supervision for improved function and mobility (09/18/15)   Time 4   Period Weeks   Status New   Additional Short Term Goals   Additional Short Term Goals Yes   PT SHORT TERM GOAL #6   Title improve gait velocity to > 2.0 ft/sec for improved function and mobility (09/18/15)   Time 4   Period Weeks   Status New   PT SHORT TERM GOAL #7   Title perform BERG balance test with appropriate STG and LTGs to be written (09/18/15)   Time 4   Period Weeks   Status New   PT SHORT TERM GOAL #8   Title perform sit to/from stand x 5 reps without UE support for improved strength and function (09/18/15)   Time 4   Period Weeks   Status New           PT Long Term Goals - 08/27/15 1144    PT LONG TERM GOAL #5   Title ambulate > 500' on various indoor/outdoor surfaces modified independent for improved function and mobility with LRAD (10/16/15)   Time 8   Period Weeks   Status New   Additional Long Term Goals   Additional Long Term Goals Yes   PT LONG TERM GOAL #6   Title improve gait velocity to > 2.5 ft/sec for improved function and mobility (10/16/15)   Time 8   Period  Weeks   Status New   PT LONG TERM GOAL #7   Title BERG:       (10/16/15)   Time 8   Period Weeks   Status New  Plan - 08/27/15 1553    Clinical Impression Statement Skilled session worked on dynamic gait including outside surfaces and obstacle negotiation. Increase imbalance with visuals scanning tasks and had 1 major LOB on level surface which seemed to be due to poor attention in a distracting environment.   Rehab Potential Good   PT Frequency 3x / week   PT Duration 8 weeks   PT Treatment/Interventions ADLs/Self Care Home Management;Electrical Stimulation;Therapeutic exercise;Therapeutic activities;Functional mobility training;Stair training;Gait training;DME Instruction;Balance training;Neuromuscular re-education;Patient/family education;Orthotic Fit/Training   PT Next Visit Plan continue gait; possible AFO changes, LE strengthening   Consulted and Agree with Plan of Care Patient   Family Member Consulted husband      Patient will benefit from skilled therapeutic intervention in order to improve the following deficits and impairments:  Abnormal gait, Difficulty walking, Pain, Impaired sensation, Decreased strength, Decreased mobility, Decreased balance  Visit Diagnosis: Abnormality of gait  Other abnormalities of gait and mobility  Muscle weakness (generalized)  Unsteadiness on feet     Problem List Patient Active Problem List   Diagnosis Date Noted  . Anemia of chronic disease 05/21/2015  . Metastatic cancer to spine ([place]) 03/27/2015  . Thrombocytopenia ([place]) 03/12/2015  . Prerenal azotemia 03/12/2015  . Pain   . Recurrent UTI--due to Klebsiella   . Paraplegia at T4 level ([place]) 02/20/2015  . Neurogenic bowel 02/20/2015  . Neurogenic bladder 02/20/2015  . Postoperative anemia due to acute blood loss 02/17/2015  . Spinal cord compression due to malignant neoplasm metastatic to spine ([place]) 02/15/2015  . Epidural mass 02/15/2015  . Thoracic spine  tumor   . Pathologic fracture of vertebra 02/14/2015  . Pathologic fracture of thoracic vertebrae 02/14/2015  . Breast cancer metastasized to bone ([place]) 02/14/2015  . Hyperlipidemia 02/14/2015  . H. pylori infection   . HTN (hypertension) 04/17/2012  . Primary cancer of upper outer quadrant of right female breast ([place]) 03/08/2012   Bjorn Loser, PTA  08/27/2015, 4:01 PM [facility] [address] [place], [place], [address] Phone: [phone]   Fax:  [phone]  Name: Heather [name] MRN: [id number] Date of Birth: Nov 18, 1945

## 2015-08-27 NOTE — Therapy (Signed)
[facility] [address] [place], [place], [address] Phone: [phone]   Fax:  [phone]  Physical Therapy Renewal  Patient Details  Name: Heather [name] MRN: [id number] Date of Birth: [birth year] Referring Provider: Alger Simons, MD  Encounter Date: 08/21/2015    Past Medical History  Diagnosis Date  . Chronic kidney disease   . Hx: UTI (urinary tract infection)   . Right shoulder pain   . Right knee pain   . Hypertension     recently started Aldactone   . Hyperlipidemia     but not on meds;diet and exercise controlled  . Dizziness     has been going on for 62month;medical MD aware  . H/O hiatal hernia   . GERD (gastroesophageal reflux disease)     Tums prn  . Hemorrhoids   . Urinary frequency     d/t taking Aldactone  . History of UTI   . Anemia     hx of  . Cataract     immature-not sure of which eye  . Anxiety     r/t updated surgery  . Insomnia     takes Melatoniin daily  . [place]     right  . Skin cancer   . Hx of radiation therapy 10/25/12- 12/11/12    right chest wall/regional lymph nodes 5040 cGy, 28 sessions, right chest wall boost 1000 cGy 1 session  . H. pylori infection   . Metastasis to spinal column (HCC)     T5  . Complication of anesthesia     pt states she is sensitive to meds  . PONV (postoperative nausea and vomiting)     pt experienced hair loss, confusion and combative- 02/15/15    Past Surgical History  Procedure Laterality Date  . Cyst removed from left breast  1970  . Left wrist surgery   2004    with plate  . Colonoscopy    . Esophagogastroduodenoscopy    . Mastectomy, radical    . Mastectomy modified radical  05/10/2012    Procedure: MASTECTOMY MODIFIED RADICAL;  Surgeon: PMerrie Roof MD;  [facility]  Service: General;  Laterality: Right;  RIGHT MODIFIED RADICAL MASTECTOMY  . Portacath placement  05/10/2012    Procedure: INSERTION PORT-A-CATH;   Surgeon: PMerrie Roof MD;  [facility]  Service: General;  Laterality: Left;  . Decompressive lumbar laminectomy level 4 N/A 02/15/2015    Procedure: DECOMPRESSION T5 AND T3-T7 STABALIZATION;  Surgeon: NConsuella Lose MD;  [facility];  Service: Neurosurgery;  Laterality: N/A;  . Laminectomy N/A 03/27/2015    Procedure: Thoracic Four-Thoracic Six Laminectomy for tumor;  Surgeon: NConsuella Lose MD;  [facility];  Service: Neurosurgery;  Laterality: N/A;  T4-T6 Laminectomy    There were no vitals filed for this visit.          [facility] - 08[facility] 1140    Transfers   Transfers Sit to Stand;Stand to Sit;Stand Pivot Transfers   Sit to Stand 6: Modified independent (Device/Increase time)   Stand to Sit 6: Modified independent (Device/Increase time)   Stand Pivot Transfers 6: Modified independent (Device/Increase time)  with RW   Ambulation/Gait   Ambulation/Gait Yes   Ambulation Distance (Feet) 130 Feet  110 x 2;240'   Gait Pattern Step-through pattern;Decreased hip/knee flexion - right;Decreased hip/knee flexion - left;Decreased dorsiflexion - right;Decreased dorsiflexion - left;Right genu recurvatum;Left genu recurvatum   Curb 4:  Min assist                     [facility] Adult PT Treatment/Exercise - 08/27/15 1140    Ambulation/Gait   Ambulation/Gait Assistance 4: Min guard;2: Max assist  Max A with LOB amb. on level surface, with multiple distrac   Assistive device R Forearm Crutch;L Forearm Crutch   Ramp 4: Min assist   Therapeutic Activites    Therapeutic Activities Other Therapeutic Activities   Other Therapeutic Activities standing x 6 minutes 30 seconds for UE task with supervision without UE support                   PT Short Term Goals - 08/27/15 1142    PT SHORT TERM GOAL #5   Title amb > 300' with LRAD with supervision for improved function and mobility (09/18/15)   Time 4   Period Weeks   Status New    Additional Short Term Goals   Additional Short Term Goals Yes   PT SHORT TERM GOAL #6   Title improve gait velocity to > 2.0 ft/sec for improved function and mobility (09/18/15)   Time 4   Period Weeks   Status New   PT SHORT TERM GOAL #7   Title perform BERG balance test with appropriate STG and LTGs to be written (09/18/15)   Time 4   Period Weeks   Status New   PT SHORT TERM GOAL #8   Title perform sit to/from stand x 5 reps without UE support for improved strength and function (09/18/15)   Time 4   Period Weeks   Status New           PT Long Term Goals - 08/27/15 1144    PT LONG TERM GOAL #5   Title ambulate > 500' on various indoor/outdoor surfaces modified independent for improved function and mobility with LRAD (10/16/15)   Time 8   Period Weeks   Status New   Additional Long Term Goals   Additional Long Term Goals Yes   PT LONG TERM GOAL #6   Title improve gait velocity to > 2.5 ft/sec for improved function and mobility (10/16/15)   Time 8   Period Weeks   Status New   PT LONG TERM GOAL #7   Title BERG:       (10/16/15)   Time 8   Period Weeks   Status New               Plan - 08/27/15 1147    Clinical Impression Statement Pt has met all LTGs and is continuing to progress well with PT.  Has been able to begin amb with forearm crutches and decreasing reliance on w/c for mobility.  Pt conitnues to demonstrate decreased strength and balance affecting safe functional mobility.  Recommending continued OPPT 3x/wk x 8 wks.  May decrease to 2x/wk as pt progresses.   Rehab Potential Good   PT Frequency 3x / week   PT Duration 8 weeks   PT Treatment/Interventions ADLs/Self Care Home Management;Electrical Stimulation;Therapeutic exercise;Therapeutic activities;Functional mobility training;Stair training;Gait training;DME Instruction;Balance training;Neuromuscular re-education;Patient/family education;Orthotic Fit/Training   PT Next Visit Plan continue gait; possible  AFO changes, LE strengthening   Consulted and Agree with Plan of Care Patient   Family Member Consulted husband      Patient will benefit from skilled therapeutic intervention in order to improve the following deficits and impairments:  Abnormal gait, Difficulty walking, Pain, Impaired sensation, Decreased strength,  Decreased mobility, Decreased balance  Visit Diagnosis: Other abnormalities of gait and mobility - Plan: PT plan of care cert/re-cert  Muscle weakness (generalized) - Plan: PT plan of care cert/re-cert  Unsteadiness on feet - Plan: PT plan of care cert/re-cert  Abnormal posture - Plan: PT plan of care cert/re-cert     Problem List Patient Active Problem List   Diagnosis Date Noted  . Anemia of chronic disease 05/21/2015  . Metastatic cancer to spine ([place]) 03/27/2015  . Thrombocytopenia ([place]) 03/12/2015  . Prerenal azotemia 03/12/2015  . Pain   . Recurrent UTI--due to Klebsiella   . Paraplegia at T4 level ([place]) 02/20/2015  . Neurogenic bowel 02/20/2015  . Neurogenic bladder 02/20/2015  . Postoperative anemia due to acute blood loss 02/17/2015  . Spinal cord compression due to malignant neoplasm metastatic to spine ([place]) 02/15/2015  . Epidural mass 02/15/2015  . Thoracic spine tumor   . Pathologic fracture of vertebra 02/14/2015  . Pathologic fracture of thoracic vertebrae 02/14/2015  . Breast cancer metastasized to bone ([place]) 02/14/2015  . Hyperlipidemia 02/14/2015  . H. pylori infection   . HTN (hypertension) 04/17/2012  . Primary cancer of upper outer quadrant of right female breast ([place]) 03/08/2012   Laureen Abrahams, PT, DPT 08/27/2015 11:52 AM  [facility] [address] [place], [place], [address] Phone: [phone]   Fax:  [phone]  Name: Heather [name] MRN: [id number] Date of Birth: 08-Jan-1946

## 2015-08-28 ENCOUNTER — Telehealth: Payer: Self-pay | Admitting: Physical Therapy

## 2015-08-28 ENCOUNTER — Encounter: Payer: Medicare Other | Admitting: Occupational Therapy

## 2015-08-28 DIAGNOSIS — G8220 Paraplegia, unspecified: Secondary | ICD-10-CM

## 2015-08-28 NOTE — Telephone Encounter (Signed)
CRUTCHES ORDERED. THANKS!

## 2015-08-28 NOTE — Telephone Encounter (Signed)
Pt progressing well with mobility; began forearm crutch training and she is doing really well with this.  Please order forearm crutches.  Thanks!

## 2015-08-28 NOTE — Addendum Note (Signed)
Addended by: Alger Simons T on: 08/28/2015 12:19 PM   Modules accepted: Orders

## 2015-08-31 ENCOUNTER — Ambulatory Visit: Payer: Medicare Other | Admitting: Physical Therapy

## 2015-08-31 ENCOUNTER — Encounter: Payer: Medicare Other | Admitting: Occupational Therapy

## 2015-08-31 DIAGNOSIS — R2681 Unsteadiness on feet: Secondary | ICD-10-CM

## 2015-08-31 DIAGNOSIS — R293 Abnormal posture: Secondary | ICD-10-CM | POA: Diagnosis not present

## 2015-08-31 DIAGNOSIS — R2689 Other abnormalities of gait and mobility: Secondary | ICD-10-CM

## 2015-08-31 DIAGNOSIS — R269 Unspecified abnormalities of gait and mobility: Secondary | ICD-10-CM

## 2015-08-31 DIAGNOSIS — M6281 Muscle weakness (generalized): Secondary | ICD-10-CM | POA: Diagnosis not present

## 2015-08-31 DIAGNOSIS — Z7409 Other reduced mobility: Secondary | ICD-10-CM | POA: Diagnosis not present

## 2015-08-31 NOTE — Therapy (Signed)
[facility] [address] [place], [place], [address] Phone: [phone]   Fax:  [phone]  Physical Therapy Treatment  Patient Details  Name: Heather [name] MRN: [id number] Date of Birth: 05/14/1945 Referring Provider: Alger Simons, MD  Encounter Date: 08/31/2015      PT End of Session - 08/31/15 1323    Visit Number 26   Number of Visits 40  per renewal   Date for PT Re-Evaluation 08/21/15   PT Start Time 1232   PT Stop Time 1315   PT Time Calculation (min) 43 min   Equipment Utilized During Treatment Gait belt   Activity Tolerance Patient tolerated treatment well   Behavior During Therapy [facility] for tasks assessed/performed      Past Medical History  Diagnosis Date  . Chronic kidney disease   . Hx: UTI (urinary tract infection)   . Right shoulder pain   . Right knee pain   . Hypertension     recently started Aldactone   . Hyperlipidemia     but not on meds;diet and exercise controlled  . Dizziness     has been going on for 71month;medical MD aware  . H/O hiatal hernia   . GERD (gastroesophageal reflux disease)     Tums prn  . Hemorrhoids   . Urinary frequency     d/t taking Aldactone  . History of UTI   . Anemia     hx of  . Cataract     immature-not sure of which eye  . Anxiety     r/t updated surgery  . Insomnia     takes Melatoniin daily  . Breast cancer ([place] [facility]     right  . Skin cancer   . Hx of radiation therapy 10/25/12- 12/11/12    right chest wall/regional lymph nodes 5040 cGy, 28 sessions, right chest wall boost 1000 cGy 1 session  . H. pylori infection   . Metastasis to spinal column (HCC)     T5  . Complication of anesthesia     pt states she is sensitive to meds  . PONV (postoperative nausea and vomiting)     pt experienced hair loss, confusion and combative- 02/15/15    Past Surgical History  Procedure Laterality Date  . Cyst removed from left breast  1970  . Left wrist  surgery   2004    with plate  . Colonoscopy    . Esophagogastroduodenoscopy    . Mastectomy, radical    . Mastectomy modified radical  05/10/2012    Procedure: MASTECTOMY MODIFIED RADICAL;  Surgeon: PMerrie Roof MD;  [facility]  Service: General;  Laterality: Right;  RIGHT MODIFIED RADICAL MASTECTOMY  . Portacath placement  05/10/2012    Procedure: INSERTION PORT-A-CATH;  Surgeon: PMerrie Roof MD;  [facility]  Service: General;  Laterality: Left;  . Decompressive lumbar laminectomy level 4 N/A 02/15/2015    Procedure: DECOMPRESSION T5 AND T3-T7 STABALIZATION;  Surgeon: NConsuella Lose MD;  [facility];  Service: Neurosurgery;  Laterality: N/A;  . Laminectomy N/A 03/27/2015    Procedure: Thoracic Four-Thoracic Six Laminectomy for tumor;  Surgeon: NConsuella Lose MD;  [place] [facility];  Service: Neurosurgery;  Laterality: N/A;  T4-T6 Laminectomy    There were no vitals filed for this visit.      Subjective Assessment - 08/31/15 1250    Subjective No falls since last visit.    Limitations House hold activities;Standing;Walking  How long can you walk comfortably? first time 2/13: 30-40 ft with RW   Patient Stated Goals improve mobility; walk   Currently in Pain? Yes   Pain Score 2    Pain Location Back   Pain Orientation Mid   Pain Descriptors / Indicators Burning  irratated   Pain Type Chronic pain   Pain Onset More than a month ago   Pain Frequency Constant                         [facility] Adult PT Treatment/Exercise - 08/31/15 0001    Ambulation/Gait   Ambulation/Gait Yes   Ambulation/Gait Assistance 4: Min guard;5: Supervision   Ambulation/Gait Assistance Details working on Engineer, civil (consulting) (Feet) 345 Feet   Assistive device Lofstrands   Gait Pattern Step-through pattern   Ambulation Surface Level   Gait Comments multiple stops to correct sequence, pt self corrected  no major LOB   Lumbar Exercises:  Quadruped   Straight Leg Raise 5 reps  Min A for balance; unable to extend LEs when supporting upper trunk over ball.   Tall kneel Tall Kneel- Alt UE raises with intermittent support from grean Physioball   Min A; Progressing without support with head turns and trunk rotations                PT Education - 08/31/15 1322    Education provided Yes   Education Details Reviewed STG for gait   Person(s) Educated Patient   Methods Explanation   Comprehension Verbalized understanding          PT Short Term Goals - 08/27/15 1142    PT SHORT TERM GOAL #5   Title amb > 300' with LRAD with supervision for improved function and mobility (09/18/15)   Time 4   Period Weeks   Status New   Additional Short Term Goals   Additional Short Term Goals Yes   PT SHORT TERM GOAL #6   Title improve gait velocity to > 2.0 ft/sec for improved function and mobility (09/18/15)   Time 4   Period Weeks   Status New   PT SHORT TERM GOAL #7   Title perform BERG balance test with appropriate STG and LTGs to be written (09/18/15)   Time 4   Period Weeks   Status New   PT SHORT TERM GOAL #8   Title perform sit to/from stand x 5 reps without UE support for improved strength and function (09/18/15)   Time 4   Period Weeks   Status New           PT Long Term Goals - 08/27/15 1144    PT LONG TERM GOAL #5   Title ambulate > 500' on various indoor/outdoor surfaces modified independent for improved function and mobility with LRAD (10/16/15)   Time 8   Period Weeks   Status New   Additional Long Term Goals   Additional Long Term Goals Yes   PT LONG TERM GOAL #6   Title improve gait velocity to > 2.5 ft/sec for improved function and mobility (10/16/15)   Time 8   Period Weeks   Status New   PT LONG TERM GOAL #7   Title BERG:       (10/16/15)   Time 8   Period Weeks   Status New               Plan - 08/31/15 1325    Clinical  Impression Statement Pt is progressing with gait increasing  with distance and required less A although less distracted today due to a more quiet environment.   Rehab Potential Good   PT Frequency 3x / week   PT Duration 8 weeks   PT Treatment/Interventions ADLs/Self Care Home Management;Electrical Stimulation;Therapeutic exercise;Therapeutic activities;Functional mobility training;Stair training;Gait training;DME Instruction;Balance training;Neuromuscular re-education;Patient/family education;Orthotic Fit/Training   PT Next Visit Plan continue gait; follow up with possible AFO changes, LE strengthening   Consulted and Agree with Plan of Care Patient   Family Member Consulted husband      Patient will benefit from skilled therapeutic intervention in order to improve the following deficits and impairments:  Abnormal gait, Difficulty walking, Pain, Impaired sensation, Decreased strength, Decreased mobility, Decreased balance  Visit Diagnosis: Abnormality of gait  Other abnormalities of gait and mobility  Muscle weakness (generalized)  Unsteadiness on feet  Abnormal posture     Problem List Patient Active Problem List   Diagnosis Date Noted  . Anemia of chronic disease 05/21/2015  . Metastatic cancer to spine ([place]) 03/27/2015  . Thrombocytopenia ([place]) 03/12/2015  . Prerenal azotemia 03/12/2015  . Pain   . Recurrent UTI--due to Klebsiella   . Paraplegia at T4 level ([place]) 02/20/2015  . Neurogenic bowel 02/20/2015  . Neurogenic bladder 02/20/2015  . Postoperative anemia due to acute blood loss 02/17/2015  . Spinal cord compression due to malignant neoplasm metastatic to spine ([place]) 02/15/2015  . Epidural mass 02/15/2015  . Thoracic spine tumor   . Pathologic fracture of vertebra 02/14/2015  . Pathologic fracture of thoracic vertebrae 02/14/2015  . Breast cancer metastasized to bone ([place]) 02/14/2015  . Hyperlipidemia 02/14/2015  . H. pylori infection   . HTN (hypertension) 04/17/2012  . Primary cancer of upper outer quadrant of right  female breast ([place]) 03/08/2012    Bjorn Loser, PTA  08/31/2015, 1:31 PM [facility] [address] [place], [place], [address] Phone: [phone]   Fax:  [phone]  Name: Heather [name] MRN: [id number] Date of Birth: 1946-01-24

## 2015-09-01 ENCOUNTER — Ambulatory Visit (HOSPITAL_COMMUNITY)
Admission: RE | Admit: 2015-09-01 | Discharge: 2015-09-01 | Disposition: A | Discharge status: Home / Self Care | Payer: Medicare Other | Source: Ambulatory Visit | Attending: Hematology and Oncology | Admitting: Hematology and Oncology

## 2015-09-01 DIAGNOSIS — M8448XA Pathological fracture, other site, initial encounter for fracture: Secondary | ICD-10-CM | POA: Insufficient documentation

## 2015-09-01 DIAGNOSIS — R911 Solitary pulmonary nodule: Secondary | ICD-10-CM | POA: Insufficient documentation

## 2015-09-01 DIAGNOSIS — Z9011 Acquired absence of right breast and nipple: Secondary | ICD-10-CM | POA: Insufficient documentation

## 2015-09-01 DIAGNOSIS — I10 Essential (primary) hypertension: Secondary | ICD-10-CM | POA: Diagnosis not present

## 2015-09-01 DIAGNOSIS — Z981 Arthrodesis status: Secondary | ICD-10-CM | POA: Insufficient documentation

## 2015-09-01 DIAGNOSIS — D638 Anemia in other chronic diseases classified elsewhere: Secondary | ICD-10-CM | POA: Diagnosis not present

## 2015-09-01 DIAGNOSIS — C50919 Malignant neoplasm of unspecified site of unspecified female breast: Secondary | ICD-10-CM | POA: Diagnosis not present

## 2015-09-01 DIAGNOSIS — C7951 Secondary malignant neoplasm of bone: Secondary | ICD-10-CM | POA: Diagnosis not present

## 2015-09-01 DIAGNOSIS — C50911 Malignant neoplasm of unspecified site of right female breast: Secondary | ICD-10-CM

## 2015-09-01 DIAGNOSIS — C50411 Malignant neoplasm of upper-outer quadrant of right female breast: Secondary | ICD-10-CM

## 2015-09-01 MED ORDER — IOPAMIDOL (ISOVUE-300) INJECTION 61%
100.0000 mL | Freq: Once | INTRAVENOUS | Status: AC | PRN
  Administered 2015-09-01: 100 mL via INTRAVENOUS

## 2015-09-02 ENCOUNTER — Ambulatory Visit: Payer: Medicare Other | Admitting: Physical Therapy

## 2015-09-02 ENCOUNTER — Other Ambulatory Visit: Payer: Self-pay

## 2015-09-02 ENCOUNTER — Encounter: Payer: Medicare Other | Admitting: Occupational Therapy

## 2015-09-02 DIAGNOSIS — R2689 Other abnormalities of gait and mobility: Secondary | ICD-10-CM

## 2015-09-02 DIAGNOSIS — R293 Abnormal posture: Secondary | ICD-10-CM

## 2015-09-02 DIAGNOSIS — R2681 Unsteadiness on feet: Secondary | ICD-10-CM

## 2015-09-02 DIAGNOSIS — M6281 Muscle weakness (generalized): Secondary | ICD-10-CM

## 2015-09-02 DIAGNOSIS — R269 Unspecified abnormalities of gait and mobility: Secondary | ICD-10-CM | POA: Diagnosis not present

## 2015-09-02 DIAGNOSIS — Z7409 Other reduced mobility: Secondary | ICD-10-CM | POA: Diagnosis not present

## 2015-09-02 NOTE — Therapy (Signed)
[facility] [address] [place], [place], [address] Phone: [phone]   Fax:  [phone]  Physical Therapy Treatment  Patient Details  Name: Heather [name] MRN: [id number] Date of Birth: [birth year] Referring Provider: Alger Simons, MD  Encounter Date: 09/02/2015      PT End of Session - 09/02/15 1058    Visit Number 27   Number of Visits 40   Date for PT Re-Evaluation 08/21/15   PT Start Time 4315   PT Stop Time 1058   PT Time Calculation (min) 43 min   Equipment Utilized During Treatment Gait belt   Activity Tolerance Patient tolerated treatment well   Behavior During Therapy [facility] for tasks assessed/performed      Past Medical History  Diagnosis Date  . Chronic kidney disease   . Hx: UTI (urinary tract infection)   . Right shoulder pain   . Right knee pain   . Hypertension     recently started Aldactone   . Hyperlipidemia     but not on meds;diet and exercise controlled  . Dizziness     has been going on for 76month;medical MD aware  . H/O hiatal hernia   . GERD (gastroesophageal reflux disease)     Tums prn  . Hemorrhoids   . Urinary frequency     d/t taking Aldactone  . History of UTI   . Anemia     hx of  . Cataract     immature-not sure of which eye  . Anxiety     r/t updated surgery  . Insomnia     takes Melatoniin daily  . [place] ([place]     right  . Skin cancer   . Hx of radiation therapy 10/25/12- 12/11/12    right chest wall/regional lymph nodes 5040 cGy, 28 sessions, right chest wall boost 1000 cGy 1 session  . H. pylori infection   . Metastasis to spinal column (HCC)     T5  . Complication of anesthesia     pt states she is sensitive to meds  . PONV (postoperative nausea and vomiting)     pt experienced hair loss, confusion and combative- 02/15/15    Past Surgical History  Procedure Laterality Date  . Cyst removed from left breast  1970  . Left wrist surgery   2004   with plate  . Colonoscopy    . Esophagogastroduodenoscopy    . Mastectomy, radical    . Mastectomy modified radical  05/10/2012    Procedure: MASTECTOMY MODIFIED RADICAL;  Surgeon: PMerrie Roof MD;  [facility]  Service: General;  Laterality: Right;  RIGHT MODIFIED RADICAL MASTECTOMY  . Portacath placement  05/10/2012    Procedure: INSERTION PORT-A-CATH;  Surgeon: PMerrie Roof MD;  [place]  Service: General;  Laterality: Left;  . Decompressive lumbar laminectomy level 4 N/A 02/15/2015    Procedure: DECOMPRESSION T5 AND T3-T7 STABALIZATION;  Surgeon: NConsuella Lose MD;  [facility];  Service: Neurosurgery;  Laterality: N/A;  . Laminectomy N/A 03/27/2015    Procedure: Thoracic Four-Thoracic Six Laminectomy for tumor;  Surgeon: NConsuella Lose MD;  [facility];  Service: Neurosurgery;  Laterality: N/A;  T4-T6 Laminectomy    There were no vitals filed for this visit.      Subjective Assessment - 09/02/15 1019    Subjective very sore after last session; having increased sensations and increased soreness   Patient Stated Goals improve mobility;  walk   Currently in Pain? No/denies                         [facility] Adult PT Treatment/Exercise - 09/02/15 1036    Ambulation/Gait   Ambulation/Gait Yes   Ambulation/Gait Assistance 4: Min guard   Ambulation Distance (Feet) 345 Feet   Assistive device Lofstrands   Gait Pattern Step-through pattern  2 point pattern   Ambulation Surface Level;Indoor   Gait Comments no LOB with only one episode of needing sequence correction   Lumbar Exercises: Quadruped   Straight Leg Raise 10 reps   Opposite Arm/Leg Raise Right arm/Left leg;Left arm/Right leg;5 reps   Plank hip abdct x 5 bil   Knee/Hip Exercises: Stretches   Passive Hamstring Stretch Both;2 reps;20 seconds   Knee/Hip Exercises: Seated   Other Seated Knee/Hip Exercises tall kneeling shoulder flexion x 10; ant weight shifting 2x10  with PT holding feet and +1 for safety                  PT Short Term Goals - 08/27/15 1142    PT SHORT TERM GOAL #5   Title amb > 300' with LRAD with supervision for improved function and mobility (09/18/15)   Time 4   Period Weeks   Status New   Additional Short Term Goals   Additional Short Term Goals Yes   PT SHORT TERM GOAL #6   Title improve gait velocity to > 2.0 ft/sec for improved function and mobility (09/18/15)   Time 4   Period Weeks   Status New   PT SHORT TERM GOAL #7   Title perform BERG balance test with appropriate STG and LTGs to be written (09/18/15)   Time 4   Period Weeks   Status New   PT SHORT TERM GOAL #8   Title perform sit to/from stand x 5 reps without UE support for improved strength and function (09/18/15)   Time 4   Period Weeks   Status New           PT Long Term Goals - 08/27/15 1144    PT LONG TERM GOAL #5   Title ambulate > 500' on various indoor/outdoor surfaces modified independent for improved function and mobility with LRAD (10/16/15)   Time 8   Period Weeks   Status New   Additional Long Term Goals   Additional Long Term Goals Yes   PT LONG TERM GOAL #6   Title improve gait velocity to > 2.5 ft/sec for improved function and mobility (10/16/15)   Time 8   Period Weeks   Status New   PT LONG TERM GOAL #7   Title BERG:       (10/16/15)   Time 8   Period Weeks   Status New               Plan - 09/02/15 1058    Clinical Impression Statement Pt tolerated exercises well and amb with minguard A today with only 1 episode of needing to correct sequence.  Will plan to give forearm crutches at next session.   PT Next Visit Plan continue gait; follow up with possible AFO changes, LE strengthening   Consulted and Agree with Plan of Care Patient      Patient will benefit from skilled therapeutic intervention in order to improve the following deficits and impairments:     Visit Diagnosis: Other abnormalities of gait and  mobility  Muscle weakness (  generalized)  Unsteadiness on feet  Abnormal posture     Problem List Patient Active Problem List   Diagnosis Date Noted  . Anemia of chronic disease 05/21/2015  . Metastatic cancer to spine ([place]) 03/27/2015  . Thrombocytopenia ([place]) 03/12/2015  . Prerenal azotemia 03/12/2015  . Pain   . Recurrent UTI--due to Klebsiella   . Paraplegia at T4 level ([place]) 02/20/2015  . Neurogenic bowel 02/20/2015  . Neurogenic bladder 02/20/2015  . Postoperative anemia due to acute blood loss 02/17/2015  . Spinal cord compression due to malignant neoplasm metastatic to spine ([place]) 02/15/2015  . Epidural mass 02/15/2015  . Thoracic spine tumor   . Pathologic fracture of vertebra 02/14/2015  . Pathologic fracture of thoracic vertebrae 02/14/2015  . Breast cancer metastasized to bone ([place]) 02/14/2015  . Hyperlipidemia 02/14/2015  . H. pylori infection   . HTN (hypertension) 04/17/2012  . Primary cancer of upper outer quadrant of right female breast ([place]) 03/08/2012   Laureen Abrahams, PT, DPT 09/02/2015 11:00 AM  [facility] [address] [place], [place], [address] Phone: [phone]   Fax:  [phone]  Name: Heather [name] MRN: [id number] Date of Birth: [birth year]

## 2015-09-03 ENCOUNTER — Encounter: Payer: Self-pay | Admitting: Hematology and Oncology

## 2015-09-03 ENCOUNTER — Telehealth: Payer: Self-pay | Admitting: Hematology and Oncology

## 2015-09-03 ENCOUNTER — Ambulatory Visit (HOSPITAL_BASED_OUTPATIENT_CLINIC_OR_DEPARTMENT_OTHER): Payer: Medicare Other | Admitting: Hematology and Oncology

## 2015-09-03 ENCOUNTER — Other Ambulatory Visit (HOSPITAL_BASED_OUTPATIENT_CLINIC_OR_DEPARTMENT_OTHER): Payer: Medicare Other

## 2015-09-03 ENCOUNTER — Ambulatory Visit (HOSPITAL_BASED_OUTPATIENT_CLINIC_OR_DEPARTMENT_OTHER): Payer: Medicare Other

## 2015-09-03 ENCOUNTER — Ambulatory Visit: Payer: Medicare Other

## 2015-09-03 VITALS — BP 150/74 | HR 79 | Temp 98.2°F | Resp 18

## 2015-09-03 DIAGNOSIS — Z171 Estrogen receptor negative status [ER-]: Secondary | ICD-10-CM | POA: Diagnosis not present

## 2015-09-03 DIAGNOSIS — R350 Frequency of micturition: Secondary | ICD-10-CM | POA: Diagnosis not present

## 2015-09-03 DIAGNOSIS — C773 Secondary and unspecified malignant neoplasm of axilla and upper limb lymph nodes: Secondary | ICD-10-CM

## 2015-09-03 DIAGNOSIS — C7951 Secondary malignant neoplasm of bone: Secondary | ICD-10-CM | POA: Diagnosis not present

## 2015-09-03 DIAGNOSIS — C50911 Malignant neoplasm of unspecified site of right female breast: Secondary | ICD-10-CM

## 2015-09-03 DIAGNOSIS — C50411 Malignant neoplasm of upper-outer quadrant of right female breast: Secondary | ICD-10-CM

## 2015-09-03 DIAGNOSIS — Z5112 Encounter for antineoplastic immunotherapy: Secondary | ICD-10-CM

## 2015-09-03 DIAGNOSIS — N319 Neuromuscular dysfunction of bladder, unspecified: Secondary | ICD-10-CM

## 2015-09-03 LAB — CBC WITH DIFFERENTIAL/PLATELET
BASO%: 0.2 % (ref 0.0–2.0)
Basophils Absolute: 0 10*3/uL (ref 0.0–0.1)
EOS%: 0.9 % (ref 0.0–7.0)
Eosinophils Absolute: 0.1 10*3/uL (ref 0.0–0.5)
HCT: 37.2 % (ref 34.8–46.6)
HGB: 12.1 g/dL (ref 11.6–15.9)
LYMPH%: 9.7 % — ABNORMAL LOW (ref 14.0–49.7)
MCH: 26.2 pg (ref 25.1–34.0)
MCHC: 32.5 g/dL (ref 31.5–36.0)
MCV: 80.7 fL (ref 79.5–101.0)
MONO#: 0.5 10*3/uL (ref 0.1–0.9)
MONO%: 9.5 % (ref 0.0–14.0)
NEUT#: 4.3 10*3/uL (ref 1.5–6.5)
NEUT%: 79.7 % — ABNORMAL HIGH (ref 38.4–76.8)
Platelets: 190 10*3/uL (ref 145–400)
RBC: 4.61 10*6/uL (ref 3.70–5.45)
RDW: 16 % — ABNORMAL HIGH (ref 11.2–14.5)
WBC: 5.4 10*3/uL (ref 3.9–10.3)
lymph#: 0.5 10*3/uL — ABNORMAL LOW (ref 0.9–3.3)
nRBC: 0 % (ref 0–0)

## 2015-09-03 LAB — COMPREHENSIVE METABOLIC PANEL
ALT: 21 U/L (ref 0–55)
AST: 26 U/L (ref 5–34)
Albumin: 3.7 g/dL (ref 3.5–5.0)
Alkaline Phosphatase: 71 U/L (ref 40–150)
Anion Gap: 7 mEq/L (ref 3–11)
BUN: 9.3 mg/dL (ref 7.0–26.0)
CO2: 27 mEq/L (ref 22–29)
Calcium: 9.8 mg/dL (ref 8.4–10.4)
Chloride: 102 mEq/L (ref 98–109)
Creatinine: 0.8 mg/dL (ref 0.6–1.1)
EGFR: 78 mL/min/{1.73_m2} — ABNORMAL LOW (ref >90)
Glucose: 98 mg/dl (ref 70–140)
Potassium: 4.2 mEq/L (ref 3.5–5.1)
Sodium: 136 mEq/L (ref 136–145)
Total Bilirubin: 0.44 mg/dL (ref 0.20–1.20)
Total Protein: 6.9 g/dL (ref 6.4–8.3)

## 2015-09-03 LAB — URINALYSIS, MICROSCOPIC - CHCC
Bilirubin (Urine): NEGATIVE
Glucose: NEGATIVE mg/dL
Ketones: NEGATIVE mg/dL
Nitrite: NEGATIVE
Protein: NEGATIVE mg/dL
Specific Gravity, Urine: 1.005 (ref 1.003–1.035)
Urobilinogen, UR: 0.2 mg/dL (ref 0.2–1)
pH: 7.5 (ref 4.6–8.0)

## 2015-09-03 MED ORDER — SODIUM CHLORIDE 0.9 % IJ SOLN
10.0000 mL | INTRAMUSCULAR | Status: DC | PRN
  Administered 2015-09-03: 10 mL
  Filled 2015-09-03: qty 10

## 2015-09-03 MED ORDER — DIPHENHYDRAMINE HCL 25 MG PO CAPS
ORAL_CAPSULE | ORAL | Status: AC
  Filled 2015-09-03: qty 1

## 2015-09-03 MED ORDER — ACETAMINOPHEN 325 MG PO TABS
650.0000 mg | ORAL_TABLET | Freq: Once | ORAL | Status: AC
  Administered 2015-09-03: 650 mg via ORAL

## 2015-09-03 MED ORDER — SODIUM CHLORIDE 0.9 % IV SOLN: Freq: Once | INTRAVENOUS | Status: AC

## 2015-09-03 MED ORDER — TRASTUZUMAB CHEMO INJECTION 440 MG
6.0000 mg/kg | Freq: Once | INTRAVENOUS | Status: AC
  Administered 2015-09-03: 399 mg via INTRAVENOUS
  Filled 2015-09-03: qty 19

## 2015-09-03 MED ORDER — ACETAMINOPHEN 325 MG PO TABS
ORAL_TABLET | ORAL | Status: AC
  Filled 2015-09-03: qty 2

## 2015-09-03 MED ORDER — ALTEPLASE 2 MG IJ SOLR
2.0000 mg | Freq: Once | INTRAMUSCULAR | Status: DC | PRN
  Filled 2015-09-03: qty 2

## 2015-09-03 MED ORDER — SODIUM CHLORIDE 0.9 % IJ SOLN
10.0000 mL | INTRAMUSCULAR | Status: DC | PRN
  Administered 2015-09-03: 10 mL via INTRAVENOUS
  Filled 2015-09-03: qty 10

## 2015-09-03 MED ORDER — ZOLEDRONIC ACID 4 MG/100ML IV SOLN
4.0000 mg | Freq: Once | INTRAVENOUS | Status: AC
  Administered 2015-09-03: 4 mg via INTRAVENOUS
  Filled 2015-09-03: qty 100

## 2015-09-03 MED ORDER — HEPARIN SOD (PORK) LOCK FLUSH 100 UNIT/ML IV SOLN
500.0000 [IU] | Freq: Once | INTRAVENOUS | Status: AC | PRN
  Administered 2015-09-03: 500 [IU] via INTRAVENOUS
  Filled 2015-09-03: qty 5

## 2015-09-03 MED ORDER — SODIUM CHLORIDE 0.9 % IV SOLN
Freq: Once | INTRAVENOUS | Status: AC
  Administered 2015-09-03: 10:00:00 via INTRAVENOUS

## 2015-09-03 MED ORDER — HEPARIN SOD (PORK) LOCK FLUSH 100 UNIT/ML IV SOLN
500.0000 [IU] | Freq: Once | INTRAVENOUS | Status: DC | PRN
  Filled 2015-09-03: qty 5

## 2015-09-03 MED ORDER — DIPHENHYDRAMINE HCL 25 MG PO CAPS
25.0000 mg | ORAL_CAPSULE | Freq: Once | ORAL | Status: AC
  Administered 2015-09-03: 25 mg via ORAL

## 2015-09-03 NOTE — Assessment & Plan Note (Signed)
Right breast invasive ductal carcinoma ER positive PR negative HER-2 positive Ki-67 44% multifocal disease 3/7 lymph nodes positive T2 N1 M0 stage IIB status post adjuvant chemotherapy with TCH followed by Herceptin maintenance, adjuvant radiation therapy, currently on letrozole since 02/07/2013. MRI 02/14/2015: T5 large destructive lesion with pathologic compression fracture and extensive epidural tumor, involvement of T4, excision metastatic carcinoma ER 60%, PR 0%, HER-2 positive ratio 2.71 average copy #7.45 03/27/2015: T4-6 decompression surgery for spinal cord compression Radiation therapy to the spine (Dr. [name]) 04/13/2015 to 05/19/2015  Treatment plan: 1. Resumed anastrozole 05/21/2015, but it has caused significant hair loss so we switched her to exemestane 25 mg once daily 07/23/2015 2. Herceptin every 3 week started 04/10/2015 3. Obtain echocardiograms every 3 months 4. Bone metastases: Zometa every 3 months. (Change made because of diffuse bone pain related to Zometa infusions)  Zometa toxicities: bone pain and fatigue. If it causes persistent symptoms, we might switch her Zometa every 3 months.  Hypoproteinemia: patient trying to increase her dietary protein And her albumin level significant increase of 3.4  Anemia of chronic disease:Hemoglobin is slowly improving. Today's hemoglobin is  Goal of treatment: Palliation Echocardiogram 07/13/2015: EF 55-60%  CT chest abdomen pelvis 09/01/2015: Pathologic fracture with posterior fusion at T5 no other evidence of metastatic disease in the chest abdomen pelvis I reviewed the scan in great detail with the patient.  Return to clinic every 3 weeks for Herceptin infusions and every 9 weeks for clinic follow-up.

## 2015-09-03 NOTE — Patient Instructions (Signed)

## 2015-09-03 NOTE — Telephone Encounter (Signed)
appt made and avs will print in treatment room

## 2015-09-03 NOTE — Patient Instructions (Signed)
[facility] Discharge Instructions for Patients Receiving Chemotherapy  Today you received the following chemotherapy agents Zometa and Herceptin.  To help prevent nausea and vomiting after your treatment, we encourage you to take your nausea medication as directed.   If you develop nausea and vomiting that is not controlled by your nausea medication, call the clinic.   BELOW ARE SYMPTOMS THAT SHOULD BE REPORTED IMMEDIATELY:  *FEVER GREATER THAN 100.5 F  *CHILLS WITH OR WITHOUT FEVER  NAUSEA AND VOMITING THAT IS NOT CONTROLLED WITH YOUR NAUSEA MEDICATION  *UNUSUAL SHORTNESS OF BREATH  *UNUSUAL BRUISING OR BLEEDING  TENDERNESS IN MOUTH AND THROAT WITH OR WITHOUT PRESENCE OF ULCERS  *URINARY PROBLEMS  *BOWEL PROBLEMS  UNUSUAL RASH Items with * indicate a potential emergency and should be followed up as soon as possible.  Feel free to call the clinic you have any questions or concerns. The clinic phone number is [phone].  Please show the Farmer at check-in to the Emergency Department and triage nurse.

## 2015-09-03 NOTE — Progress Notes (Signed)
Unable to get in to exam room prior to MD.  No assessment performed.  

## 2015-09-03 NOTE — Progress Notes (Signed)
Patient Care Team: [name], MD as PCP - General (Family Medicine)  DIAGNOSIS: Primary cancer of upper outer quadrant of right female breast [facility])   Staging form: Breast, AJCC 7th Edition     Clinical: Stage IIB (T2, N1, cM0) - Unsigned       Staging comments: Staged at breast conference 11.6.13      Pathologic: No stage assigned - Unsigned  SUMMARY OF ONCOLOGIC HISTORY:   Primary cancer of upper outer quadrant of right female breast ([place])   03/08/2012 Initial Diagnosis Right breast invasive ductal carcinoma ER positive PR negative HER-2 positive Ki-67 44%; another breast mass biopsied in the anterior part of the breast which was also positive for malignancy that was HER-2 negative   05/10/2012 Surgery Right mastectomy and axillary lymph node dissection: Multifocal disease 5 cm, 1.7 cm, 1.6 cm, ER positive PR negative HER-2 positive Ki-67 44%, 3/7 lymph nodes positive   06/14/2012 - 06/12/2013 Chemotherapy Adjuvant chemotherapy with Wink 6 followed by Herceptin maintenance   10/25/2012 - 12/11/2012 Radiation Therapy Adjuvant radiation therapy   02/07/2013 -  Anti-estrogen oral therapy Letrozole 2.5 mg daily   02/14/2015 Imaging MRI spine: Large destructive T5 lesion with severe pathologic compression fracture and extensive epidural tumor, severe spinal stenosis with moderate cord compression, tumor involvement of T4   03/18/2015 PET scan Residual enhancing soft tissue adjacent to the spinal cord. No evidence of metastatic disease. Nonspecific uptake the left nipple   03/27/2015 - 03/30/2015 Hospital Admission T4-6 decompression for spinal cord compression (lower extremity paralysis)   04/10/2015 -  Chemotherapy Palliative treatment with Herceptin every 3 weeks with letrozole 2.5 mg daily   04/13/2015 - 05/19/2015 Radiation Therapy Palliative radiation treatment to the spine   09/01/2015 Imaging CT chest abdomen pelvis: Pathologic fracture with posterior fusion at T5 no other evidence of metastatic  disease in the chest abdomen pelvis   CHIEF COMPLIANT: Follow-up to review CT scans as well as on Herceptin with letrozole treatment  INTERVAL HISTORY: KARISHA MARLIN is a 70 year old with above-mentioned history of metastatic breast cancer with solitary bone lesion that led to spinal cord compression and pathologic fracture which was decompressed followed by palliative radiation therapy. She is currently on Herceptin with letrozole. She complained of profound hair loss related to letrozole. But she decided not to change to exemestane and she remained on letrozole. She thinks of the hair loss is slowing down. She is now able to walk with the help of crutches. This is a huge improvement from before. She complains of urinary frequency as well as burning sensation.  REVIEW OF SYSTEMS:   Constitutional: Denies fevers, chills or abnormal weight loss Eyes: Denies blurriness of vision Ears, nose, mouth, throat, and face: Denies mucositis or sore throat Respiratory: Denies cough, dyspnea or wheezes Cardiovascular: Denies palpitation, chest discomfort Gastrointestinal:  Denies nausea, heartburn or change in bowel habits Skin: Denies abnormal skin rashes Lymphatics: Denies new lymphadenopathy or easy bruising Neurological: Lower extremity weakness related to paralysis improving with physical therapy. Behavioral/Psych: Mood is stable, no new changes  Extremities: No lower extremity edema Breast:  denies any pain or lumps or nodules in either breasts All other systems were reviewed with the patient and are negative.  I have reviewed the past medical history, past surgical history, social history and family history with the patient and they are unchanged from previous note.  ALLERGIES:  is allergic to codeine; keflex; naprosyn; oxycodone; and tussin.  MEDICATIONS:  Current Outpatient Prescriptions  Medication Sig  Dispense Refill  . apixaban (ELIQUIS) 2.5 MG TABS tablet Take 1 tablet (2.5 mg total) by  mouth 2 (two) times daily. (Patient not taking: Reported on 08/11/2015) 180 tablet 0  . aspirin 81 MG tablet Take 81 mg by mouth daily.    Marland Kitchen b complex vitamins tablet Take 1 tablet by mouth daily.    . Calcium Carbonate Antacid (TUMS PO) Take 2 tablets by mouth 2 (two) times daily as needed (acid reflux).    . Calcium Carbonate-Vitamin D (CALCIUM-D PO) Take 1 tablet by mouth daily. Reported on 06/25/2015    . Cholecalciferol (VITAMIN D) 2000 UNITS tablet Take 2,000 Units by mouth daily.    . Coenzyme Q10 (COQ10) 100 MG CAPS Take 100 mg by mouth at bedtime.    Marland Kitchen exemestane (AROMASIN) 25 MG tablet Take 1 tablet (25 mg total) by mouth daily after breakfast. 90 tablet 3  . gabapentin (NEURONTIN) 600 MG tablet Take 1 tablet (600 mg total) by mouth 3 (three) times daily. 90 tablet 3  . letrozole (FEMARA) 2.5 MG tablet Take 1 tablet (2.5 mg total) by mouth daily. 90 tablet 1  . lidocaine-prilocaine (EMLA) cream Apply to affected area once (Patient taking differently: Apply 1 application topically every 21 ( twenty-one) days. Apply to port prior to infusions) 30 g 3  . methocarbamol (ROBAXIN) 500 MG tablet Take 1 tablet (500 mg total) by mouth daily as needed for muscle spasms. 75 tablet 0  . OVER THE COUNTER MEDICATION Place 1 drop into both eyes 2 (two) times daily as needed (dry eyes). Over the counter lubricating eye drops    . polyethylene glycol (MIRALAX / GLYCOLAX) packet Take 17 g by mouth 2 (two) times daily. (Patient taking differently: Take 8.5 g by mouth daily as needed (constipation). ) 100 each 0  . pravastatin (PRAVACHOL) 10 MG tablet Take 10 mg by mouth at bedtime.     . Probiotic Product (PROBIOTIC DAILY PO) Take 1 capsule by mouth 2 (two) times daily.     . prochlorperazine (COMPAZINE) 10 MG tablet Take 1 tablet (10 mg total) by mouth every 6 (six) hours as needed for nausea or vomiting. 30 tablet 0  . ranitidine (ZANTAC) 300 MG tablet Take 300 mg by mouth at bedtime. Reported on 06/25/2015      . spironolactone (ALDACTONE) 25 MG tablet Take 0.5 tablets (12.5 mg total) by mouth daily. 45 tablet 12  . traMADol (ULTRAM) 50 MG tablet Take 1 tablet (50 mg total) by mouth every 6 (six) hours as needed for moderate pain. 60 tablet 0  . Wound Cleansers (RADIAPLEX EX) Apply topically. Reported on 06/25/2015     No current facility-administered medications for this visit.    PHYSICAL EXAMINATION: ECOG PERFORMANCE STATUS: 1 - Symptomatic but completely ambulatory  Filed Vitals:   09/03/15 0846  BP: 150/74  Pulse: 79  Temp: 98.2 F (36.8 C)  Resp: 18   There were no vitals filed for this visit.  GENERAL:alert, no distress and comfortable SKIN: skin color, texture, turgor are normal, no rashes or significant lesions EYES: normal, Conjunctiva are pink and non-injected, sclera clear OROPHARYNX:no exudate, no erythema and lips, buccal mucosa, and tongue normal  NECK: supple, thyroid normal size, non-tender, without nodularity LYMPH:  no palpable lymphadenopathy in the cervical, axillary or inguinal LUNGS: clear to auscultation and percussion with normal breathing effort HEART: regular rate & rhythm and no murmurs and no lower extremity edema ABDOMEN:abdomen soft, non-tender and normal bowel sounds MUSCULOSKELETAL:no cyanosis  of digits and no clubbing  NEURO: Lower extremity weakness and decreased sensation EXTREMITIES: No lower extremity edema  LABORATORY DATA:  I have reviewed the data as listed   Chemistry      Component Value Date/Time   NA 136 09/03/2015 0820   NA 135 03/28/2015 0446   K 4.2 09/03/2015 0820   K 3.8 03/28/2015 0446   CL 99* 03/28/2015 0446   CL 101 10/25/2012 0946   CO2 27 09/03/2015 0820   CO2 28 03/28/2015 0446   BUN 9.3 09/03/2015 0820   BUN 24* 03/28/2015 0446   CREATININE 0.8 09/03/2015 0820   CREATININE 0.78 03/28/2015 0446      Component Value Date/Time   CALCIUM 9.8 09/03/2015 0820   CALCIUM 7.8* 03/28/2015 0446   ALKPHOS 71 09/03/2015 0820    ALKPHOS 124 03/27/2015 1434   AST 26 09/03/2015 0820   AST 42* 03/27/2015 1434   ALT 21 09/03/2015 0820   ALT 136* 03/27/2015 1434   BILITOT 0.44 09/03/2015 0820   BILITOT 0.9 03/27/2015 1434       Lab Results  Component Value Date   WBC 5.4 09/03/2015   HGB 12.1 09/03/2015   HCT 37.2 09/03/2015   MCV 80.7 09/03/2015   PLT 190 09/03/2015   NEUTROABS 4.3 09/03/2015     ASSESSMENT & PLAN:  Primary cancer of upper outer quadrant of right female breast ([place]) Right breast invasive ductal carcinoma ER positive PR negative HER-2 positive Ki-67 44% multifocal disease 3/7 lymph nodes positive T2 N1 M0 stage IIB status post adjuvant chemotherapy with TCH followed by Herceptin maintenance, adjuvant radiation therapy, currently on letrozole since 02/07/2013. MRI 02/14/2015: T5 large destructive lesion with pathologic compression fracture and extensive epidural tumor, involvement of T4, excision metastatic carcinoma ER 60%, PR 0%, HER-2 positive ratio 2.71 average copy #7.45 03/27/2015: T4-6 decompression surgery for spinal cord compression Radiation therapy to the spine (Dr. [name]) 04/13/2015 to 05/19/2015  Treatment plan: 1. Resumed anastrozole 05/21/2015, but it has caused significant hair loss so we switched her to exemestane 25 mg once daily 07/23/2015 2. Herceptin every 3 week started 04/10/2015 3. Obtain echocardiograms every 3 months 4. Bone metastases: Zometa every 3 months. (Change made because of diffuse bone pain related to Zometa infusions)  Zometa toxicities: bone pain and fatigue. If it causes persistent symptoms, we might switch her Zometa every 3 months. She receives Zometa today.  Hypoproteinemia: patient trying to increase her dietary protein And her albumin level significant increase of 3.4  Anemia of chronic disease:Hemoglobin is slowly improving. Today's hemoglobin is 12.1 Goal of treatment: Palliation Echocardiogram 07/13/2015: EF 55-60% (down from 65-70%) I  will ask Dr. Haroldine Laws to see her back  CT chest abdomen pelvis 09/01/2015: Pathologic fracture with posterior fusion at T5 no other evidence of metastatic disease in the chest abdomen pelvis I reviewed the scan in great detail with the patient.  Return to clinic every 3 weeks for Herceptin infusions and every 9 weeks for clinic follow-up.     Orders Placed This Encounter  Procedures  . UA with Microscopic    Standing Status: Future     Number of Occurrences: 1     Standing Expiration Date: 09/02/2016   The patient has a good understanding of the overall plan. she agrees with it. she will call with any problems that may develop before the next visit here.   Rulon Eisenmenger, MD 09/03/2015

## 2015-09-07 ENCOUNTER — Ambulatory Visit: Payer: Medicare Other | Attending: Physical Medicine & Rehabilitation | Admitting: Physical Therapy

## 2015-09-07 ENCOUNTER — Encounter: Payer: Medicare Other | Admitting: Occupational Therapy

## 2015-09-07 ENCOUNTER — Encounter: Payer: Self-pay | Admitting: Physical Therapy

## 2015-09-07 DIAGNOSIS — M6281 Muscle weakness (generalized): Secondary | ICD-10-CM | POA: Diagnosis not present

## 2015-09-07 DIAGNOSIS — R2681 Unsteadiness on feet: Secondary | ICD-10-CM | POA: Diagnosis not present

## 2015-09-07 DIAGNOSIS — R293 Abnormal posture: Secondary | ICD-10-CM | POA: Diagnosis not present

## 2015-09-07 DIAGNOSIS — H8111 Benign paroxysmal vertigo, right ear: Secondary | ICD-10-CM | POA: Diagnosis not present

## 2015-09-07 DIAGNOSIS — R2689 Other abnormalities of gait and mobility: Secondary | ICD-10-CM | POA: Diagnosis not present

## 2015-09-07 NOTE — Therapy (Signed)
[facility] [address] [place], [place], [address] Phone: [phone]   Fax:  [phone]  Physical Therapy Treatment  Patient Details  Name: Heather [name] MRN: [id number] Date of Birth: 05/13/1945 Referring Provider: Alger Simons, MD  Encounter Date: 09/07/2015      PT End of Session - 09/07/15 1241    Visit Number 28   Number of Visits 40   Date for PT Re-Evaluation 08/21/15   PT Start Time 0935   PT Stop Time 1015   PT Time Calculation (min) 40 min   Equipment Utilized During Treatment Gait belt   Activity Tolerance Patient tolerated treatment well   Behavior During Therapy [facility] for tasks assessed/performed      Past Medical History  Diagnosis Date  . Chronic kidney disease   . Hx: UTI (urinary tract infection)   . Right shoulder pain   . Right knee pain   . Hypertension     recently started Aldactone   . Hyperlipidemia     but not on meds;diet and exercise controlled  . Dizziness     has been going on for 47month;medical MD aware  . H/O hiatal hernia   . GERD (gastroesophageal reflux disease)     Tums prn  . Hemorrhoids   . Urinary frequency     d/t taking Aldactone  . History of UTI   . Anemia     hx of  . Cataract     immature-not sure of which eye  . Anxiety     r/t updated surgery  . Insomnia     takes Melatoniin daily  . [place]     right  . Skin cancer   . Hx of radiation therapy 10/25/12- 12/11/12    right chest wall/regional lymph nodes 5040 cGy, 28 sessions, right chest wall boost 1000 cGy 1 session  . H. pylori infection   . Metastasis to spinal column ([facility])     T5  . Complication of anesthesia     pt states she is sensitive to meds  . PONV (postoperative nausea and vomiting)     pt experienced hair loss, confusion and combative- 02/15/15    Past Surgical History  Procedure Laterality Date  . Cyst removed from left breast  1970  . Left wrist surgery   2004     with plate  . Colonoscopy    . Esophagogastroduodenoscopy    . Mastectomy, radical    . Mastectomy modified radical  05/10/2012    Procedure: MASTECTOMY MODIFIED RADICAL;  Surgeon: PMerrie Roof MD;  [place] [facility]  Service: General;  Laterality: Right;  RIGHT MODIFIED RADICAL MASTECTOMY  . Portacath placement  05/10/2012    Procedure: INSERTION PORT-A-CATH;  Surgeon: PMerrie Roof MD;  [place] [facility]  Service: General;  Laterality: Left;  . Decompressive lumbar laminectomy level 4 N/A 02/15/2015    Procedure: DECOMPRESSION T5 AND T3-T7 STABALIZATION;  Surgeon: NConsuella Lose MD;  [facility];  Service: Neurosurgery;  Laterality: N/A;  . Laminectomy N/A 03/27/2015    Procedure: Thoracic Four-Thoracic Six Laminectomy for tumor;  Surgeon: NConsuella Lose MD;  [facility];  Service: Neurosurgery;  Laterality: N/A;  T4-T6 Laminectomy    There were no vitals filed for this visit.      Subjective Assessment - 09/07/15 0937    Subjective Sore from having a fall on Sunday but does not think she is injured. [address](had  supervsion) on the pavement using 4 wheeled walker, "I know I'm not supposed to use that; walker go away from me." surface was unlevel and walker was to far and Rgith LE gave way.  Pt was picked up by family.    Patient Stated Goals improve mobility; walk   Currently in Pain? Yes   Pain Score 3    Pain Location Generalized   Pain Orientation Posterior   Pain Descriptors / Indicators Sore   Pain Type Acute pain   Pain Radiating Towards Up in neck and down to lower back   Pain Onset In the past 7 days   Pain Frequency Constant   Aggravating Factors  Fall                         [facility] Adult PT Treatment/Exercise - 09/07/15 0001    Ambulation/Gait   Ambulation/Gait Yes   Ambulation/Gait Assistance 4: Min guard   Ambulation/Gait Assistance Details working on balance with visual scanning, changes in direction and functional  reaching activity   Ambulation Distance (Feet) 345 Feet + shorter distance simulating household environment.   Assistive device Lofstrands   Gait Pattern Step-through pattern   Ambulation Surface Level;Indoor   Ramp 4: Min assist  progressing to [employer]                  PT Short Term Goals - 08/27/15 1142    PT SHORT TERM GOAL #5   Title amb > 300' with LRAD with supervision for improved function and mobility (09/18/15)   Time 4   Period Weeks   Status New   Additional Short Term Goals   Additional Short Term Goals Yes   PT SHORT TERM GOAL #6   Title improve gait velocity to > 2.0 ft/sec for improved function and mobility (09/18/15)   Time 4   Period Weeks   Status New   PT SHORT TERM GOAL #7   Title perform BERG balance test with appropriate STG and LTGs to be written (09/18/15)   Time 4   Period Weeks   Status New   PT SHORT TERM GOAL #8   Title perform sit to/from stand x 5 reps without UE support for improved strength and function (09/18/15)   Time 4   Period Weeks   Status New           PT Long Term Goals - 08/27/15 1144    PT LONG TERM GOAL #5   Title ambulate > 500' on various indoor/outdoor surfaces modified independent for improved function and mobility with LRAD (10/16/15)   Time 8   Period Weeks   Status New   Additional Long Term Goals   Additional Long Term Goals Yes   PT LONG TERM GOAL #6   Title improve gait velocity to > 2.5 ft/sec for improved function and mobility (10/16/15)   Time 8   Period Weeks   Status New   PT LONG TERM GOAL #7   Title BERG:       (10/16/15)   Time 8   Period Weeks   Status New               Plan - 09/07/15 1242    Clinical Impression Statement Pt was sore today reporting having a fall over the weekend while using a rollator; see Subjective.  Pt had no LOB with gait training today, using Loft[employer] crutches and progressed gait training working on safety and  balance in home environment with reaching  activites and changes in direction.  Continues to have difficulty with visual scanning during gait.                                                                   PT Frequency 1x / week   PT Next Visit Plan continue gait; follow up with possible AFO changes, LE strengthening   Consulted and Agree with Plan of Care Patient      Patient will benefit from skilled therapeutic intervention in order to improve the following deficits and impairments:     Visit Diagnosis: Other abnormalities of gait and mobility     Problem List Patient Active Problem List   Diagnosis Date Noted  . Anemia of chronic disease 05/21/2015  . Metastatic cancer to spine ([place]) 03/27/2015  . Thrombocytopenia ([place]) 03/12/2015  . Prerenal azotemia 03/12/2015  . Pain   . Recurrent UTI--due to Klebsiella   . Paraplegia at T4 level ([place]) 02/20/2015  . Neurogenic bowel 02/20/2015  . Neurogenic bladder 02/20/2015  . Postoperative anemia due to acute blood loss 02/17/2015  . Spinal cord compression due to malignant neoplasm metastatic to spine ([place]) 02/15/2015  . Epidural mass 02/15/2015  . Thoracic spine tumor   . Pathologic fracture of vertebra 02/14/2015  . Pathologic fracture of thoracic vertebrae 02/14/2015  . Breast cancer metastasized to bone ([place]) 02/14/2015  . Hyperlipidemia 02/14/2015  . H. pylori infection   . HTN (hypertension) 04/17/2012  . Primary cancer of upper outer quadrant of right female breast ([place]) 03/08/2012   Bjorn Loser, PTA  09/07/2015, 4:03 PM   [facility] [address] [place], [place], [address] Phone: [phone]   Fax:  [phone]  Name: ZYASIA HALBLEIB MRN: [id number] Date of Birth: 1945-09-25

## 2015-09-09 ENCOUNTER — Encounter: Payer: Medicare Other | Admitting: Occupational Therapy

## 2015-09-09 ENCOUNTER — Ambulatory Visit: Payer: Medicare Other | Admitting: Physical Therapy

## 2015-09-09 ENCOUNTER — Encounter: Payer: Self-pay | Admitting: Physical Therapy

## 2015-09-09 ENCOUNTER — Encounter (HOSPITAL_COMMUNITY): Payer: Self-pay | Admitting: Internal Medicine

## 2015-09-09 DIAGNOSIS — R293 Abnormal posture: Secondary | ICD-10-CM | POA: Diagnosis not present

## 2015-09-09 DIAGNOSIS — R2689 Other abnormalities of gait and mobility: Secondary | ICD-10-CM

## 2015-09-09 DIAGNOSIS — R2681 Unsteadiness on feet: Secondary | ICD-10-CM

## 2015-09-09 DIAGNOSIS — H8111 Benign paroxysmal vertigo, right ear: Secondary | ICD-10-CM | POA: Diagnosis not present

## 2015-09-09 DIAGNOSIS — M6281 Muscle weakness (generalized): Secondary | ICD-10-CM

## 2015-09-09 NOTE — Therapy (Signed)
[facility] [address] [place], [place], [address] Phone: [phone]   Fax:  [phone]  Physical Therapy Treatment  Patient Details  Name: Heather [name] MRN: [id number] Date of Birth: [birth year] Referring Provider: Alger Simons, MD  Encounter Date: 09/09/2015      PT End of Session - 09/09/15 1258    Visit Number 29   Number of Visits 40   Date for PT Re-Evaluation 08/21/15   PT Start Time 0935   PT Stop Time 1015   PT Time Calculation (min) 40 min   Equipment Utilized During Treatment Gait belt   Activity Tolerance Patient tolerated treatment well   Behavior During Therapy [facility] for tasks assessed/performed      Past Medical History  Diagnosis Date  . Chronic kidney disease   . Hx: UTI (urinary tract infection)   . Right shoulder pain   . Right knee pain   . Hypertension     recently started Aldactone   . Hyperlipidemia     but not on meds;diet and exercise controlled  . Dizziness     has been going on for 67month;medical MD aware  . H/O hiatal hernia   . GERD (gastroesophageal reflux disease)     Tums prn  . Hemorrhoids   . Urinary frequency     d/t taking Aldactone  . History of UTI   . Anemia     hx of  . Cataract     immature-not sure of which eye  . Anxiety     r/t updated surgery  . Insomnia     takes Melatoniin daily  . Breast cancer ([place]     right  . Skin cancer   . Hx of radiation therapy 10/25/12- 12/11/12    right chest wall/regional lymph nodes 5040 cGy, 28 sessions, right chest wall boost 1000 cGy 1 session  . H. pylori infection   . Metastasis to spinal column (HCC)     T5  . Complication of anesthesia     pt states she is sensitive to meds  . PONV (postoperative nausea and vomiting)     pt experienced hair loss, confusion and combative- 02/15/15    Past Surgical History  Procedure Laterality Date  . Cyst removed from left breast  1970  . Left wrist surgery   2004     with plate  . Colonoscopy    . Esophagogastroduodenoscopy    . Mastectomy, radical    . Mastectomy modified radical  05/10/2012    Procedure: MASTECTOMY MODIFIED RADICAL;  Surgeon: PMerrie Roof MD;  [place] [facility]  Service: General;  Laterality: Right;  RIGHT MODIFIED RADICAL MASTECTOMY  . Portacath placement  05/10/2012    Procedure: INSERTION PORT-A-CATH;  Surgeon: PMerrie Roof MD;  [place]  Service: General;  Laterality: Left;  . Decompressive lumbar laminectomy level 4 N/A 02/15/2015    Procedure: DECOMPRESSION T5 AND T3-T7 STABALIZATION;  Surgeon: NConsuella Lose MD;  [facility];  Service: Neurosurgery;  Laterality: N/A;  . Laminectomy N/A 03/27/2015    Procedure: Thoracic Four-Thoracic Six Laminectomy for tumor;  Surgeon: NConsuella Lose MD;  [facility];  Service: Neurosurgery;  Laterality: N/A;  T4-T6 Laminectomy    There were no vitals filed for this visit.      Subjective Assessment - 09/09/15 1006    Subjective Tail bone still sore from last fall.   Patient Stated Goals improve mobility; walk  Currently in Pain? Yes   Pain Score 3    Pain Location Generalized   Pain Descriptors / Indicators Sore   Pain Type Acute pain   Pain Onset In the past 7 days                         [facility] Adult PT Treatment/Exercise - 09/09/15 0001    Ambulation/Gait   Ambulation/Gait Yes   Ambulation/Gait Assistance 5: Supervision   Ambulation/Gait Assistance Details Working on endurance while trialling gait without L AFO   40mn continuous with R AFO donned   Ambulation Distance (Feet) 450 Feet   Assistive device Rolling walker   Gait Pattern Step-through pattern   Ambulation Surface Level   Gait Comments Performed 2nd trial with L Ottobock Reaction AFO  continued L genu recurvatum and ankle instability.                PT Education - 09/09/15 1256    Education provided Yes   Education Details Orthitist present:  reviewed Pros  and Cons on L AFO adjustment or progressing to a least restrictive brace, options and continued POC.   Person(s) Educated Patient   Methods Explanation   Comprehension Verbalized understanding;Need further instruction          PT Short Term Goals - 08/27/15 1142    PT SHORT TERM GOAL #5   Title amb > 300' with LRAD with supervision for improved function and mobility (09/18/15)   Time 4   Period Weeks   Status New   Additional Short Term Goals   Additional Short Term Goals Yes   PT SHORT TERM GOAL #6   Title improve gait velocity to > 2.0 ft/sec for improved function and mobility (09/18/15)   Time 4   Period Weeks   Status New   PT SHORT TERM GOAL #7   Title perform BERG balance test with appropriate STG and LTGs to be written (09/18/15)   Time 4   Period Weeks   Status New   PT SHORT TERM GOAL #8   Title perform sit to/from stand x 5 reps without UE support for improved strength and function (09/18/15)   Time 4   Period Weeks   Status New           PT Long Term Goals - 08/27/15 1144    PT LONG TERM GOAL #5   Title ambulate > 500' on various indoor/outdoor surfaces modified independent for improved function and mobility with LRAD (10/16/15)   Time 8   Period Weeks   Status New   Additional Long Term Goals   Additional Long Term Goals Yes   PT LONG TERM GOAL #6   Title improve gait velocity to > 2.5 ft/sec for improved function and mobility (10/16/15)   Time 8   Period Weeks   Status New   PT LONG TERM GOAL #7   Title BERG:       (10/16/15)   Time 8   Period Weeks   Status New               Plan - 09/09/15 1012    Clinical Impression Statement Orthotist present and recommending that pt continues to need L knee and ankle support due to continued instablilty with gait and that current brace is still appropriate at this time. Trialled L Ottobock Reaction brace with heel lift but pt continued to have L knee genu recurvatum and Left ankle pronation.  PT Frequency 1x / week   PT Next Visit Plan continue gait;  LE strengthening   Consulted and Agree with Plan of Care Patient      Patient will benefit from skilled therapeutic intervention in order to improve the following deficits and impairments:     Visit Diagnosis: Other abnormalities of gait and mobility  Muscle weakness (generalized)  Unsteadiness on feet     Problem List Patient Active Problem List   Diagnosis Date Noted  . Anemia of chronic disease 05/21/2015  . Metastatic cancer to spine ([place]) 03/27/2015  . Thrombocytopenia ([place]) 03/12/2015  . Prerenal azotemia 03/12/2015  . Pain   . Recurrent UTI--due to Klebsiella   . Paraplegia at T4 level ([place]) 02/20/2015  . Neurogenic bowel 02/20/2015  . Neurogenic bladder 02/20/2015  . Postoperative anemia due to acute blood loss 02/17/2015  . Spinal cord compression due to malignant neoplasm metastatic to spine ([place]) 02/15/2015  . Epidural mass 02/15/2015  . Thoracic spine tumor   . Pathologic fracture of vertebra 02/14/2015  . Pathologic fracture of thoracic vertebrae 02/14/2015  . Breast cancer metastasized to bone ([place]) 02/14/2015  . Hyperlipidemia 02/14/2015  . H. pylori infection   . HTN (hypertension) 04/17/2012  . Primary cancer of upper outer quadrant of right female breast ([place]) 03/08/2012    Bjorn Loser, PTA  09/09/2015, 1:01 PM [facility] [address] [place], [place], [address] Phone: [phone]   Fax:  [phone]  Name: Heather [name] MRN: [id number] Date of Birth: 1946/01/25

## 2015-09-11 ENCOUNTER — Ambulatory Visit: Payer: Medicare Other | Admitting: Physical Therapy

## 2015-09-11 DIAGNOSIS — R293 Abnormal posture: Secondary | ICD-10-CM

## 2015-09-11 DIAGNOSIS — R2681 Unsteadiness on feet: Secondary | ICD-10-CM

## 2015-09-11 DIAGNOSIS — M6281 Muscle weakness (generalized): Secondary | ICD-10-CM

## 2015-09-11 DIAGNOSIS — H8111 Benign paroxysmal vertigo, right ear: Secondary | ICD-10-CM | POA: Diagnosis not present

## 2015-09-11 DIAGNOSIS — R2689 Other abnormalities of gait and mobility: Secondary | ICD-10-CM

## 2015-09-11 NOTE — Therapy (Signed)
[facility] [address] [place], [place], [address] Phone: [phone]   Fax:  [phone]  Physical Therapy Treatment  Patient Details  Name: Heather [name] MRN: [id number] Date of Birth: 14-Nov-1945 Referring Provider: Alger Simons, MD  Encounter Date: 09/11/2015      PT End of Session - 09/11/15 1033    Visit Number 30   Number of Visits 40   Date for PT Re-Evaluation 08/21/15   PT Start Time 0930   PT Stop Time 1018   PT Time Calculation (min) 48 min   Equipment Utilized During Treatment Gait belt   Activity Tolerance Patient tolerated treatment well   Behavior During Therapy [facility] for tasks assessed/performed      Past Medical History  Diagnosis Date  . Chronic kidney disease   . Hx: UTI (urinary tract infection)   . Right shoulder pain   . Right knee pain   . Hypertension     recently started Aldactone   . Hyperlipidemia     but not on meds;diet and exercise controlled  . Dizziness     has been going on for 73month;medical MD aware  . H/O hiatal hernia   . GERD (gastroesophageal reflux disease)     Tums prn  . Hemorrhoids   . Urinary frequency     d/t taking Aldactone  . History of UTI   . Anemia     hx of  . Cataract     immature-not sure of which eye  . Anxiety     r/t updated surgery  . Insomnia     takes Melatoniin daily  . Breast cancer ([place]     right  . Skin cancer   . Hx of radiation therapy 10/25/12- 12/11/12    right chest wall/regional lymph nodes 5040 cGy, 28 sessions, right chest wall boost 1000 cGy 1 session  . H. pylori infection   . Metastasis to spinal column (HCC)     T5  . Complication of anesthesia     pt states she is sensitive to meds  . PONV (postoperative nausea and vomiting)     pt experienced hair loss, confusion and combative- 02/15/15    Past Surgical History  Procedure Laterality Date  . Cyst removed from left breast  1970  . Left wrist surgery   2004     with plate  . Colonoscopy    . Esophagogastroduodenoscopy    . Mastectomy, radical    . Mastectomy modified radical  05/10/2012    Procedure: MASTECTOMY MODIFIED RADICAL;  Surgeon: PMerrie Roof MD;  [place] [facility] [place]  Service: General;  Laterality: Right;  RIGHT MODIFIED RADICAL MASTECTOMY  . Portacath placement  05/10/2012    Procedure: INSERTION PORT-A-CATH;  Surgeon: PMerrie Roof MD;  [place] [facility]  Service: General;  Laterality: Left;  . Decompressive lumbar laminectomy level 4 N/A 02/15/2015    Procedure: DECOMPRESSION T5 AND T3-T7 STABALIZATION;  Surgeon: NConsuella Lose MD;  [facility];  Service: Neurosurgery;  Laterality: N/A;  . Laminectomy N/A 03/27/2015    Procedure: Thoracic Four-Thoracic Six Laminectomy for tumor;  Surgeon: NConsuella Lose MD;  [facility];  Service: Neurosurgery;  Laterality: N/A;  T4-T6 Laminectomy    There were no vitals filed for this visit.      Subjective Assessment - 09/11/15 0930    Subjective doing well; practicing with forearm crutches   Patient Stated Goals improve mobility; walk  Currently in Pain? No/denies            [facility] PT Assessment - 09/11/15 0949    Ambulation/Gait   Ambulation/Gait Yes   Ambulation/Gait Assistance 5: Supervision   Ambulation Distance (Feet) 1500 Feet   Assistive device Lofstrands   Gait Pattern Step-through pattern   Ambulation Surface Level   Gait Comments session focused on visual scanning with forearm crutches   Standardized Balance Assessment   Standardized Balance Assessment Berg Balance Test   Berg Balance Test   Sit to Stand Able to stand  independently using hands   Standing Unsupported Able to stand 2 minutes with supervision   Sitting with Back Unsupported but Feet Supported on Floor or Stool Able to sit safely and securely 2 minutes   Stand to Sit Sits safely with minimal use of hands   Transfers Able to transfer safely, definite need of hands   Standing  Unsupported with Eyes Closed Able to stand 3 seconds   Standing Ubsupported with Feet Together Needs help to attain position but able to stand for 30 seconds with feet together   From Standing, Reach Forward with Outstretched Arm Can reach forward >5 cm safely (2")   From Standing Position, Pick up Object from Floor Able to pick up shoe, needs supervision   From Standing Position, Turn to Look Behind Over each Shoulder Needs supervision when turning   Turn 360 Degrees Needs assistance while turning   Standing Unsupported, Alternately Place Feet on Step/Stool Able to complete >2 steps/needs minimal assist   Standing Unsupported, One Foot in Front Able to plae foot ahead of the other independently and hold 30 seconds   Standing on One Leg Tries to lift leg/unable to hold 3 seconds but remains standing independently   Total Score 31                               PT Short Term Goals - 09/11/15 1034    PT SHORT TERM GOAL #5   Title amb > 300' with LRAD with supervision for improved function and mobility (09/18/15)   Status On-going   PT SHORT TERM GOAL #6   Title improve gait velocity to > 2.0 ft/sec for improved function and mobility (09/18/15)   Status On-going   PT SHORT TERM GOAL #7   Title perform BERG balance test with appropriate STG and LTGs to be written (09/18/15)   Status Achieved   PT SHORT TERM GOAL #8   Title perform sit to/from stand x 5 reps without UE support for improved strength and function (09/18/15)   Status On-going           PT Long Term Goals - 09/11/15 1035    PT LONG TERM GOAL #5   Title ambulate > 500' on various indoor/outdoor surfaces modified independent for improved function and mobility with LRAD (10/16/15)   Status On-going   PT LONG TERM GOAL #6   Title improve gait velocity to > 2.5 ft/sec for improved function and mobility (10/16/15)   Status On-going   PT LONG TERM GOAL #7   Title BERG:  >/= 38/56     (10/16/15)   Status Revised                Plan - 09/11/15 1033    Clinical Impression Statement Pt scored 31/56 on BERG balance test indicating high fall risk, but able to complete today as pt unable  to stand unsupported on initial evaluation.  Progressing well and will continue to benefit from PT to maximize funciton.   PT Next Visit Plan continue gait;  LE strengthening   Consulted and Agree with Plan of Care Patient      Patient will benefit from skilled therapeutic intervention in order to improve the following deficits and impairments:     Visit Diagnosis: Other abnormalities of gait and mobility  Muscle weakness (generalized)  Unsteadiness on feet  Abnormal posture       G-Codes - 2015-09-28 1035    Functional Assessment Tool Used amb up to 1500' with forearm crutches, supervision/minguard A   Functional Limitation Mobility: Walking and moving around   Mobility: Walking and Moving Around Current Status ([id number]) At least 40 percent but less than 60 percent impaired, limited or restricted   Mobility: Walking and Moving Around Goal Status [id number]) At least 1 percent but less than 20 percent impaired, limited or restricted      Problem List Patient Active Problem List   Diagnosis Date Noted  . Anemia of chronic disease 05/21/2015  . Metastatic cancer to spine ([place]) 03/27/2015  . Thrombocytopenia ([place]) 03/12/2015  . Prerenal azotemia 03/12/2015  . Pain   . Recurrent UTI--due to Klebsiella   . Paraplegia at T4 level ([place]) 02/20/2015  . Neurogenic bowel 02/20/2015  . Neurogenic bladder 02/20/2015  . Postoperative anemia due to acute blood loss 02/17/2015  . Spinal cord compression due to malignant neoplasm metastatic to spine ([place]) 02/15/2015  . Epidural mass 02/15/2015  . Thoracic spine tumor   . Pathologic fracture of vertebra 02/14/2015  . Pathologic fracture of thoracic vertebrae 02/14/2015  . Breast cancer metastasized to bone ([place]) 02/14/2015  . Hyperlipidemia 02/14/2015  . H. pylori  infection   . HTN (hypertension) 04/17/2012  . Primary cancer of upper outer quadrant of right female breast ([place]) 03/08/2012   Laureen Abrahams, PT, DPT 09/28/15 10:37 AM  [facility] [address] [place], [place], [address] Phone: [phone]   Fax:  [phone]  Name: Heather [name] MRN: [id number] Date of Birth: 01-10-1946

## 2015-09-14 ENCOUNTER — Ambulatory Visit: Payer: Medicare Other | Admitting: Physical Therapy

## 2015-09-14 ENCOUNTER — Encounter: Payer: Medicare Other | Admitting: Occupational Therapy

## 2015-09-14 DIAGNOSIS — R2689 Other abnormalities of gait and mobility: Secondary | ICD-10-CM

## 2015-09-14 DIAGNOSIS — R293 Abnormal posture: Secondary | ICD-10-CM | POA: Diagnosis not present

## 2015-09-14 DIAGNOSIS — R2681 Unsteadiness on feet: Secondary | ICD-10-CM | POA: Diagnosis not present

## 2015-09-14 DIAGNOSIS — M6281 Muscle weakness (generalized): Secondary | ICD-10-CM | POA: Diagnosis not present

## 2015-09-14 DIAGNOSIS — H8111 Benign paroxysmal vertigo, right ear: Secondary | ICD-10-CM | POA: Diagnosis not present

## 2015-09-14 NOTE — Therapy (Signed)
[facility] [address] [place], [place], [address] Phone: [phone]   Fax:  [phone]  Physical Therapy Treatment  Patient Details  Name: Heather [name] MRN: [id number] Date of Birth: 12/15/1945 Referring Provider: Alger Simons, MD  Encounter Date: 09/14/2015      PT End of Session - 09/14/15 1405    Visit Number 31   Number of Visits 40   Date for PT Re-Evaluation 08/21/15   PT Start Time 0093   PT Stop Time 1403   PT Time Calculation (min) 48 min   Equipment Utilized During Treatment Gait belt   Activity Tolerance Patient tolerated treatment well   Behavior During Therapy [facility] for tasks assessed/performed      Past Medical History  Diagnosis Date  . Chronic kidney disease   . Hx: UTI (urinary tract infection)   . Right shoulder pain   . Right knee pain   . Hypertension     recently started Aldactone   . Hyperlipidemia     but not on meds;diet and exercise controlled  . Dizziness     has been going on for 81month;medical MD aware  . H/O hiatal hernia   . GERD (gastroesophageal reflux disease)     Tums prn  . Hemorrhoids   . Urinary frequency     d/t taking Aldactone  . History of UTI   . Anemia     hx of  . Cataract     immature-not sure of which eye  . Anxiety     r/t updated surgery  . Insomnia     takes Melatoniin daily  . Breast cancer ([place]     right  . Skin cancer   . Hx of radiation therapy 10/25/12- 12/11/12    right chest wall/regional lymph nodes 5040 cGy, 28 sessions, right chest wall boost 1000 cGy 1 session  . H. pylori infection   . Metastasis to spinal column (HCC)     T5  . Complication of anesthesia     pt states she is sensitive to meds  . PONV (postoperative nausea and vomiting)     pt experienced hair loss, confusion and combative- 02/15/15    Past Surgical History  Procedure Laterality Date  . Cyst removed from left breast  1970  . Left wrist surgery   2004   with plate  . Colonoscopy    . Esophagogastroduodenoscopy    . Mastectomy, radical    . Mastectomy modified radical  05/10/2012    Procedure: MASTECTOMY MODIFIED RADICAL;  Surgeon: PMerrie Roof MD;  [place]  Service: General;  Laterality: Right;  RIGHT MODIFIED RADICAL MASTECTOMY  . Portacath placement  05/10/2012    Procedure: INSERTION PORT-A-CATH;  Surgeon: PMerrie Roof MD;  [place] [facility]  Service: General;  Laterality: Left;  . Decompressive lumbar laminectomy level 4 N/A 02/15/2015    Procedure: DECOMPRESSION T5 AND T3-T7 STABALIZATION;  Surgeon: NConsuella Lose MD;  [place];  Service: Neurosurgery;  Laterality: N/A;  . Laminectomy N/A 03/27/2015    Procedure: Thoracic Four-Thoracic Six Laminectomy for tumor;  Surgeon: NConsuella Lose MD;  [place];  Service: Neurosurgery;  Laterality: N/A;  T4-T6 Laminectomy    There were no vitals filed for this visit.      Subjective Assessment - 09/14/15 1321    Subjective practicing with forearm crutches in the home. Would like to practise outside with husband.   Patient Stated  Goals improve mobility; walk   Currently in Pain? No/denies                         [facility] Adult PT Treatment/Exercise - 09/14/15 0001    Ambulation/Gait   Ambulation/Gait Yes   Ambulation/Gait Assistance 5: Supervision;4: Min guard;4: Min assist  Min A due to LOB x1 on level surface with head turns.   Ambulation/Gait Assistance Details working on sequence and balance on multisurfaces   Ambulation Distance (Feet) 1500 Feet   Assistive device Lofstrands   Gait Pattern Step-through pattern   Ambulation Surface Level;Unlevel;Indoor;Outdoor;Paved;Gravel;Grass   Knee/Hip Exercises: Supine   Bridges with Ball Squeeze Strengthening;10 reps  with feet on physioball   Knee Flexion 10 reps;Right;Strengthening  hooklying position with red T-band   Knee/Hip Exercises:    Hip ABduction Strengthening;Both;10  reps  hooklying with red T-band                PT Education - 09/14/15 1502    Education provided Yes   Education Details Recommend pt wear gait belt and have husbands assist when practising gait outdoors.   Person(s) Educated Patient   Methods Explanation   Comprehension Verbalized understanding          PT Short Term Goals - 09/11/15 1034    PT SHORT TERM GOAL #5   Title amb > 300' with LRAD with supervision for improved function and mobility (09/18/15)   Status On-going   PT SHORT TERM GOAL #6   Title improve gait velocity to > 2.0 ft/sec for improved function and mobility (09/18/15)   Status On-going   PT SHORT TERM GOAL #7   Title perform BERG balance test with appropriate STG and LTGs to be written (09/18/15)   Status Achieved   PT SHORT TERM GOAL #8   Title perform sit to/from stand x 5 reps without UE support for improved strength and function (09/18/15)   Status On-going           PT Long Term Goals - 09/11/15 1035    PT LONG TERM GOAL #5   Title ambulate > 500' on various indoor/outdoor surfaces modified independent for improved function and mobility with LRAD (10/16/15)   Status On-going   PT LONG TERM GOAL #6   Title improve gait velocity to > 2.5 ft/sec for improved function and mobility (10/16/15)   Status On-going   PT LONG TERM GOAL #7   Title BERG:  >/= 38/56     (10/16/15)   Status Revised               Plan - 09/14/15 1507    Clinical Impression Statement Skkilled session focusedon dynamic gait indoors and outdoors.  Assist level ranged from supervision to Min A; increase Asisst needed for unlevel and compliant surface as well as LOBx1 with head turns on level surface. Pt is making steady progress in obstacle negotiation and sequencing crutches with changes in direction.   PT Next Visit Plan continue gait;  LE strengthening   Consulted and Agree with Plan of Care Patient      Patient will benefit from skilled therapeutic intervention in  order to improve the following deficits and impairments:     Visit Diagnosis: Other abnormalities of gait and mobility     Problem List Patient Active Problem List   Diagnosis Date Noted  . Anemia of chronic disease 05/21/2015  . Metastatic cancer to spine ([place]) 03/27/2015  . Thrombocytopenia (  [place]) 03/12/2015  . Prerenal azotemia 03/12/2015  . Pain   . Recurrent UTI--due to Klebsiella   . Paraplegia at T4 level ([place]) 02/20/2015  . Neurogenic bowel 02/20/2015  . Neurogenic bladder 02/20/2015  . Postoperative anemia due to acute blood loss 02/17/2015  . Spinal cord compression due to malignant neoplasm metastatic to spine ([place]) 02/15/2015  . Epidural mass 02/15/2015  . Thoracic spine tumor   . Pathologic fracture of vertebra 02/14/2015  . Pathologic fracture of thoracic vertebrae 02/14/2015  . Breast cancer metastasized to bone ([place]) 02/14/2015  . Hyperlipidemia 02/14/2015  . H. pylori infection   . HTN (hypertension) 04/17/2012  . Primary cancer of upper outer quadrant of right female breast ([place]) 03/08/2012    Bjorn Loser, PTA  09/14/2015, 3:12 PM [facility] [address] [place], [place], [address] Phone: [phone]   Fax:  [phone]  Name: Heather [name] MRN: [id number] Date of Birth: 1945-07-21

## 2015-09-16 ENCOUNTER — Encounter: Payer: Medicare Other | Admitting: Occupational Therapy

## 2015-09-16 ENCOUNTER — Ambulatory Visit: Payer: Medicare Other | Admitting: Physical Therapy

## 2015-09-18 ENCOUNTER — Ambulatory Visit: Payer: Medicare Other | Admitting: Physical Therapy

## 2015-09-18 DIAGNOSIS — H8111 Benign paroxysmal vertigo, right ear: Secondary | ICD-10-CM | POA: Diagnosis not present

## 2015-09-18 DIAGNOSIS — R2681 Unsteadiness on feet: Secondary | ICD-10-CM | POA: Diagnosis not present

## 2015-09-18 DIAGNOSIS — M6281 Muscle weakness (generalized): Secondary | ICD-10-CM | POA: Diagnosis not present

## 2015-09-18 DIAGNOSIS — R2689 Other abnormalities of gait and mobility: Secondary | ICD-10-CM

## 2015-09-18 DIAGNOSIS — R293 Abnormal posture: Secondary | ICD-10-CM

## 2015-09-18 NOTE — Therapy (Signed)
[facility] [address] [place], [place], [address] Phone: [phone]   Fax:  [phone]  Physical Therapy Treatment  Patient Details  Name: Heather [name] MRN: [id number] Date of Birth: 02-06-1946 Referring Provider: Alger Simons, MD  Encounter Date: 09/18/2015      PT End of Session - 09/18/15 1032    Visit Number 32   Number of Visits 40   Date for PT Re-Evaluation 10/16/15   PT Start Time 0925   PT Stop Time 1010   PT Time Calculation (min) 45 min   Equipment Utilized During Treatment Gait belt   Activity Tolerance Patient tolerated treatment well   Behavior During Therapy [facility] for tasks assessed/performed      Past Medical History  Diagnosis Date  . Chronic kidney disease   . Hx: UTI (urinary tract infection)   . Right shoulder pain   . Right knee pain   . Hypertension     recently started Aldactone   . Hyperlipidemia     but not on meds;diet and exercise controlled  . Dizziness     has been going on for 15month;medical MD aware  . H/O hiatal hernia   . GERD (gastroesophageal reflux disease)     Tums prn  . Hemorrhoids   . Urinary frequency     d/t taking Aldactone  . History of UTI   . Anemia     hx of  . Cataract     immature-not sure of which eye  . Anxiety     r/t updated surgery  . Insomnia     takes Melatoniin daily  . Breast cancer ([facility]     right  . Skin cancer   . Hx of radiation therapy 10/25/12- 12/11/12    right chest wall/regional lymph nodes 5040 cGy, 28 sessions, right chest wall boost 1000 cGy 1 session  . H. pylori infection   . Metastasis to spinal column (HCC)     T5  . Complication of anesthesia     pt states she is sensitive to meds  . PONV (postoperative nausea and vomiting)     pt experienced hair loss, confusion and combative- 02/15/15    Past Surgical History  Procedure Laterality Date  . Cyst removed from left breast  1970  . Left wrist surgery   2004     with plate  . Colonoscopy    . Esophagogastroduodenoscopy    . Mastectomy, radical    . Mastectomy modified radical  05/10/2012    Procedure: MASTECTOMY MODIFIED RADICAL;  Surgeon: PMerrie Roof MD;  [facility]  Service: General;  Laterality: Right;  RIGHT MODIFIED RADICAL MASTECTOMY  . Portacath placement  05/10/2012    Procedure: INSERTION PORT-A-CATH;  Surgeon: PMerrie Roof MD;  [place] [facility]  Service: General;  Laterality: Left;  . Decompressive lumbar laminectomy level 4 N/A 02/15/2015    Procedure: DECOMPRESSION T5 AND T3-T7 STABALIZATION;  Surgeon: NConsuella Lose MD;  [facility];  Service: Neurosurgery;  Laterality: N/A;  . Laminectomy N/A 03/27/2015    Procedure: Thoracic Four-Thoracic Six Laminectomy for tumor;  Surgeon: NConsuella Lose MD;  [facility];  Service: Neurosurgery;  Laterality: N/A;  T4-T6 Laminectomy    There were no vitals filed for this visit.                       [facility] Treatment/Exercise - 09/18/15 05188  Ambulation/Gait   Ambulation/Gait Assistance 5: Supervision   Ambulation/Gait Assistance Details working on endurance and distance as well as gait with head turns; not LOB indoors today   Ambulation Distance (Feet) 1000 Feet   Assistive device Lofstrands   Gait Pattern Step-through pattern   Ambulation Surface Level;Indoor   Gait velocity 1.43 ft/sec; 2.07 ft.sec  22.88 sec with forearm crutches; 15.87 sec with RW   Knee/Hip Exercises: Seated   Sit to Sand 15 reps;without UE support  min cues and minguard A initially progressing to S             Balance Exercises - 09/18/15 1032    Balance Exercises: Standing   Stepping Strategy Anterior;10 reps  without UE support             PT Short Term Goals - 09/18/15 1031    PT SHORT TERM GOAL #5   Title amb > 300' with LRAD with supervision for improved function and mobility (09/18/15)   Status Achieved   PT SHORT TERM GOAL #6    Title improve gait velocity to > 2.0 ft/sec for improved function and mobility (09/18/15)   Baseline met with RW; progressing well towards goal with loftstrands   Status Achieved   PT SHORT TERM GOAL #7   Title perform BERG balance test with appropriate STG and LTGs to be written (09/18/15)   Baseline LTG only   Status Achieved   PT SHORT TERM GOAL #8   Title perform sit to/from stand x 5 reps without UE support for improved strength and function (09/18/15)   Status Achieved           PT Long Term Goals - 09/11/15 1035    PT LONG TERM GOAL #5   Title ambulate > 500' on various indoor/outdoor surfaces modified independent for improved function and mobility with LRAD (10/16/15)   Status On-going   PT LONG TERM GOAL #6   Title improve gait velocity to > 2.5 ft/sec for improved function and mobility (10/16/15)   Status On-going   PT LONG TERM GOAL #7   Title BERG:  >/= 38/56     (10/16/15)   Status Revised               Plan - 09/18/15 1033    Clinical Impression Statement Pt has met all STGs and is progressing well with forearm crutches.  Gait velocity slower with forearm crutches but continues to improve.  Pt able to perform stepping strategies forward without UE support today.  Pt will continue to benefit from PT to progress strength, balance and functional mobility.   PT Next Visit Plan continue gait;  LE strengthening   Consulted and Agree with Plan of Care Patient      Patient will benefit from skilled therapeutic intervention in order to improve the following deficits and impairments:     Visit Diagnosis: Other abnormalities of gait and mobility  Muscle weakness (generalized)  Unsteadiness on feet  Abnormal posture     Problem List Patient Active Problem List   Diagnosis Date Noted  . Anemia of chronic disease 05/21/2015  . Metastatic cancer to spine ([place]) 03/27/2015  . Thrombocytopenia ([place]) 03/12/2015  . Prerenal azotemia 03/12/2015  . Pain   .  Recurrent UTI--due to Klebsiella   . Paraplegia at T4 level ([place]) 02/20/2015  . Neurogenic bowel 02/20/2015  . Neurogenic bladder 02/20/2015  . Postoperative anemia due to acute blood loss 02/17/2015  . Spinal cord compression due to  malignant neoplasm metastatic to spine ([place]) 02/15/2015  . Epidural mass 02/15/2015  . Thoracic spine tumor   . Pathologic fracture of vertebra 02/14/2015  . Pathologic fracture of thoracic vertebrae 02/14/2015  . Breast cancer metastasized to bone ([place]) 02/14/2015  . Hyperlipidemia 02/14/2015  . H. pylori infection   . HTN (hypertension) 04/17/2012  . Primary cancer of upper outer quadrant of right female breast ([place]) 03/08/2012   Laureen Abrahams, PT, DPT 09/18/2015 10:38 AM  [facility] [address] [place], [place], [address] Phone: [phone]   Fax:  [phone]  Name: Heather [name] MRN: [id number] Date of Birth: [birth year]

## 2015-09-24 ENCOUNTER — Ambulatory Visit (HOSPITAL_BASED_OUTPATIENT_CLINIC_OR_DEPARTMENT_OTHER): Payer: Medicare Other

## 2015-09-24 ENCOUNTER — Other Ambulatory Visit (HOSPITAL_BASED_OUTPATIENT_CLINIC_OR_DEPARTMENT_OTHER): Payer: Medicare Other

## 2015-09-24 VITALS — BP 143/73 | HR 71 | Temp 97.4°F | Resp 18

## 2015-09-24 DIAGNOSIS — Z5112 Encounter for antineoplastic immunotherapy: Secondary | ICD-10-CM | POA: Diagnosis not present

## 2015-09-24 DIAGNOSIS — C50911 Malignant neoplasm of unspecified site of right female breast: Secondary | ICD-10-CM

## 2015-09-24 DIAGNOSIS — C50411 Malignant neoplasm of upper-outer quadrant of right female breast: Secondary | ICD-10-CM

## 2015-09-24 DIAGNOSIS — C7951 Secondary malignant neoplasm of bone: Principal | ICD-10-CM

## 2015-09-24 LAB — COMPREHENSIVE METABOLIC PANEL
ALT: 19 U/L (ref 0–55)
AST: 22 U/L (ref 5–34)
Albumin: 3.8 g/dL (ref 3.5–5.0)
Alkaline Phosphatase: 76 U/L (ref 40–150)
Anion Gap: 8 mEq/L (ref 3–11)
BUN: 14.1 mg/dL (ref 7.0–26.0)
CO2: 29 mEq/L (ref 22–29)
Calcium: 10.1 mg/dL (ref 8.4–10.4)
Chloride: 102 mEq/L (ref 98–109)
Creatinine: 0.8 mg/dL (ref 0.6–1.1)
EGFR: 78 mL/min/{1.73_m2} — ABNORMAL LOW (ref >90)
Glucose: 103 mg/dl (ref 70–140)
Potassium: 4 mEq/L (ref 3.5–5.1)
Sodium: 139 mEq/L (ref 136–145)
Total Bilirubin: 0.45 mg/dL (ref 0.20–1.20)
Total Protein: 7.2 g/dL (ref 6.4–8.3)

## 2015-09-24 LAB — CBC WITH DIFFERENTIAL/PLATELET
BASO%: 0.5 % (ref 0.0–2.0)
Basophils Absolute: 0 10*3/uL (ref 0.0–0.1)
EOS%: 1.5 % (ref 0.0–7.0)
Eosinophils Absolute: 0.1 10*3/uL (ref 0.0–0.5)
HCT: 38.1 % (ref 34.8–46.6)
HGB: 12.4 g/dL (ref 11.6–15.9)
LYMPH%: 12.5 % — ABNORMAL LOW (ref 14.0–49.7)
MCH: 26.4 pg (ref 25.1–34.0)
MCHC: 32.6 g/dL (ref 31.5–36.0)
MCV: 80.9 fL (ref 79.5–101.0)
MONO#: 0.4 10*3/uL (ref 0.1–0.9)
MONO%: 9.6 % (ref 0.0–14.0)
NEUT#: 3.5 10*3/uL (ref 1.5–6.5)
NEUT%: 75.9 % (ref 38.4–76.8)
Platelets: 174 10*3/uL (ref 145–400)
RBC: 4.71 10*6/uL (ref 3.70–5.45)
RDW: 16.3 % — ABNORMAL HIGH (ref 11.2–14.5)
WBC: 4.6 10*3/uL (ref 3.9–10.3)
lymph#: 0.6 10*3/uL — ABNORMAL LOW (ref 0.9–3.3)

## 2015-09-24 MED ORDER — TRASTUZUMAB CHEMO INJECTION 440 MG
6.0000 mg/kg | Freq: Once | INTRAVENOUS | Status: AC
  Administered 2015-09-24: 399 mg via INTRAVENOUS
  Filled 2015-09-24: qty 19

## 2015-09-24 MED ORDER — DIPHENHYDRAMINE HCL 25 MG PO CAPS
25.0000 mg | ORAL_CAPSULE | Freq: Once | ORAL | Status: AC
Start: 2015-09-24 — End: 2015-09-24
  Administered 2015-09-24: 25 mg via ORAL

## 2015-09-24 MED ORDER — DIPHENHYDRAMINE HCL 25 MG PO CAPS
ORAL_CAPSULE | ORAL | Status: AC
  Filled 2015-09-24: qty 1

## 2015-09-24 MED ORDER — SODIUM CHLORIDE 0.9 % IJ SOLN
10.0000 mL | INTRAMUSCULAR | Status: DC | PRN
  Administered 2015-09-24: 10 mL
  Filled 2015-09-24: qty 10

## 2015-09-24 MED ORDER — SODIUM CHLORIDE 0.9 % IV SOLN
Freq: Once | INTRAVENOUS | Status: AC
  Administered 2015-09-24: 09:00:00 via INTRAVENOUS

## 2015-09-24 MED ORDER — HEPARIN SOD (PORK) LOCK FLUSH 100 UNIT/ML IV SOLN
500.0000 [IU] | Freq: Once | INTRAVENOUS | Status: AC | PRN
  Administered 2015-09-24: 500 [IU]
  Filled 2015-09-24: qty 5

## 2015-09-24 MED ORDER — ACETAMINOPHEN 325 MG PO TABS
650.0000 mg | ORAL_TABLET | Freq: Once | ORAL | Status: AC
  Administered 2015-09-24: 650 mg via ORAL

## 2015-09-24 MED ORDER — ACETAMINOPHEN 325 MG PO TABS
ORAL_TABLET | ORAL | Status: AC
  Filled 2015-09-24: qty 2

## 2015-09-24 NOTE — Patient Instructions (Signed)
[facility] Discharge Instructions for Patients Receiving Chemotherapy  Today you received the following chemotherapy agents:  Herceptin  To help prevent nausea and vomiting after your treatment, we encourage you to take your nausea medication as prescribed.   If you develop nausea and vomiting that is not controlled by your nausea medication, call the clinic.   BELOW ARE SYMPTOMS THAT SHOULD BE REPORTED IMMEDIATELY:  *FEVER GREATER THAN 100.5 F  *CHILLS WITH OR WITHOUT FEVER  NAUSEA AND VOMITING THAT IS NOT CONTROLLED WITH YOUR NAUSEA MEDICATION  *UNUSUAL SHORTNESS OF BREATH  *UNUSUAL BRUISING OR BLEEDING  TENDERNESS IN MOUTH AND THROAT WITH OR WITHOUT PRESENCE OF ULCERS  *URINARY PROBLEMS  *BOWEL PROBLEMS  UNUSUAL RASH Items with * indicate a potential emergency and should be followed up as soon as possible.  Feel free to call the clinic you have any questions or concerns. The clinic phone number is [phone].  Please show the CHEMO ALERT CARD at check-in to the Emergency Department and triage nurse.   

## 2015-09-29 ENCOUNTER — Ambulatory Visit: Payer: Medicare Other | Admitting: Physical Therapy

## 2015-09-29 DIAGNOSIS — R2689 Other abnormalities of gait and mobility: Secondary | ICD-10-CM | POA: Diagnosis not present

## 2015-09-29 DIAGNOSIS — R293 Abnormal posture: Secondary | ICD-10-CM | POA: Diagnosis not present

## 2015-09-29 DIAGNOSIS — M6281 Muscle weakness (generalized): Secondary | ICD-10-CM

## 2015-09-29 DIAGNOSIS — H8111 Benign paroxysmal vertigo, right ear: Secondary | ICD-10-CM | POA: Diagnosis not present

## 2015-09-29 DIAGNOSIS — R2681 Unsteadiness on feet: Secondary | ICD-10-CM

## 2015-09-29 NOTE — Therapy (Signed)
[facility] [address] [place], [place], [address] Phone: [phone]   Fax:  [phone]  Physical Therapy Treatment  Patient Details  Name: Heather [name] MRN: [id number] Date of Birth: [birth year] Referring Provider: Alger Simons, MD  Encounter Date: 09/29/2015      PT End of Session - 09/29/15 1408    Visit Number 33   Number of Visits 40   Date for PT Re-Evaluation 10/16/15   PT Start Time 4825  pt arrived late   PT Stop Time 1600   PT Time Calculation (min) 36 min   Activity Tolerance Patient tolerated treatment well   Behavior During Therapy [facility] for tasks assessed/performed      Past Medical History  Diagnosis Date  . Chronic kidney disease   . Hx: UTI (urinary tract infection)   . Right shoulder pain   . Right knee pain   . Hypertension     recently started Aldactone   . Hyperlipidemia     but not on meds;diet and exercise controlled  . Dizziness     has been going on for 36month;medical MD aware  . H/O hiatal hernia   . GERD (gastroesophageal reflux disease)     Tums prn  . Hemorrhoids   . Urinary frequency     d/t taking Aldactone  . History of UTI   . Anemia     hx of  . Cataract     immature-not sure of which eye  . Anxiety     r/t updated surgery  . Insomnia     takes Melatoniin daily  . [place]     right  . Skin cancer   . Hx of radiation therapy 10/25/12- 12/11/12    right chest wall/regional lymph nodes 5040 cGy, 28 sessions, right chest wall boost 1000 cGy 1 session  . H. pylori infection   . Metastasis to spinal column (HCC)     T5  . Complication of anesthesia     pt states she is sensitive to meds  . PONV (postoperative nausea and vomiting)     pt experienced hair loss, confusion and combative- 02/15/15    Past Surgical History  Procedure Laterality Date  . Cyst removed from left breast  1970  . Left wrist surgery   2004    with plate  . Colonoscopy     . Esophagogastroduodenoscopy    . Mastectomy, radical    . Mastectomy modified radical  05/10/2012    Procedure: MASTECTOMY MODIFIED RADICAL;  Surgeon: PMerrie Roof MD;  [facility]  Service: General;  Laterality: Right;  RIGHT MODIFIED RADICAL MASTECTOMY  . Portacath placement  05/10/2012    Procedure: INSERTION PORT-A-CATH;  Surgeon: PMerrie Roof MD;  [place]  Service: General;  Laterality: Left;  . Decompressive lumbar laminectomy level 4 N/A 02/15/2015    Procedure: DECOMPRESSION T5 AND T3-T7 STABALIZATION;  Surgeon: NConsuella Lose MD;  [facility];  Service: Neurosurgery;  Laterality: N/A;  . Laminectomy N/A 03/27/2015    Procedure: Thoracic Four-Thoracic Six Laminectomy for tumor;  Surgeon: NConsuella Lose MD;  [facility];  Service: Neurosurgery;  Laterality: N/A;  T4-T6 Laminectomy    There were no vitals filed for this visit.      Subjective Assessment - 09/29/15 1325    Subjective getting dizzy a lot - noticed it when about to get up.  thinks it's sinuses.  came in today  with forearm crutches   Patient Stated Goals improve mobility; walk   Currently in Pain? --  usual discomfort in low back                Vestibular Assessment - 09/29/15 1406    Symptom Behavior   Type of Dizziness Spinning   Duration of Dizziness seconds   Positional Testing   Sidelying Test Sidelying Right;Sidelying Left   Horizontal Canal Testing Horizontal Canal Right;Horizontal Canal Left   Sidelying Right   Sidelying Right Duration 5 sec   Sidelying Right Symptoms Upbeat, right rotatory nystagmus   Sidelying Left   Sidelying Left Duration none   Sidelying Left Symptoms No nystagmus   Horizontal Canal Right   Horizontal Canal Right Duration none   Horizontal Canal Right Symptoms Normal   Horizontal Canal Left   Horizontal Canal Left Duration none   Horizontal Canal Left Symptoms Normal                  Vestibular  Treatment/Exercise - 09/29/15 1407    Vestibular Treatment/Exercise   Vestibular Treatment Provided Canalith Repositioning   Canalith Repositioning Epley Manuever Right    EPLEY MANUEVER RIGHT   Number of Reps  2   Overall Response Improved Symptoms   Response Details  modified epley's starting with sidelying test               PT Education - 09/29/15 1407    Education provided Yes   Education Details pro bono clinics in the area available to pt; BPPV - diagnosis and tx   Person(s) Educated Patient   Methods Explanation;Handout   Comprehension Verbalized understanding          PT Short Term Goals - 09/18/15 1031    PT SHORT TERM GOAL #5   Title amb > 300' with LRAD with supervision for improved function and mobility (09/18/15)   Status Achieved   PT SHORT TERM GOAL #6   Title improve gait velocity to > 2.0 ft/sec for improved function and mobility (09/18/15)   Baseline met with RW; progressing well towards goal with loftstrands   Status Achieved   PT SHORT TERM GOAL #7   Title perform BERG balance test with appropriate STG and LTGs to be written (09/18/15)   Baseline LTG only   Status Achieved   PT SHORT TERM GOAL #8   Title perform sit to/from stand x 5 reps without UE support for improved strength and function (09/18/15)   Status Achieved           PT Long Term Goals - 09/11/15 1035    PT LONG TERM GOAL #5   Title ambulate > 500' on various indoor/outdoor surfaces modified independent for improved function and mobility with LRAD (10/16/15)   Status On-going   PT LONG TERM GOAL #6   Title improve gait velocity to > 2.5 ft/sec for improved function and mobility (10/16/15)   Status On-going   PT LONG TERM GOAL #7   Title BERG:  >/= 38/56     (10/16/15)   Status Revised               Plan - 09/29/15 1408    Clinical Impression Statement Pt positive for R pBPPV treated today as it has been impacting mobility.  Will continue to benefit from PT to maximize  function   PT Frequency 2x / week   PT Duration 4 weeks   PT Treatment/Interventions ADLs/Self Care Home Management;Electrical Stimulation;Therapeutic exercise;Therapeutic  activities;Functional mobility training;Stair training;Gait training;DME Instruction;Balance training;Neuromuscular re-education;Patient/family education;Orthotic Fit/Training;Canalith Repostioning   PT Next Visit Plan continue gait;  LE strengthening; reassess vertigo   Consulted and Agree with Plan of Care Patient      Patient will benefit from skilled therapeutic intervention in order to improve the following deficits and impairments:     Visit Diagnosis: Other abnormalities of gait and mobility - Plan: PT plan of care cert/re-cert  Muscle weakness (generalized) - Plan: PT plan of care cert/re-cert  Unsteadiness on feet - Plan: PT plan of care cert/re-cert  Abnormal posture - Plan: PT plan of care cert/re-cert  BPPV (benign paroxysmal positional vertigo), right - Plan: PT plan of care cert/re-cert     Problem List Patient Active Problem List   Diagnosis Date Noted  . Anemia of chronic disease 05/21/2015  . Metastatic cancer to spine ([place]) 03/27/2015  . Thrombocytopenia ([place]) 03/12/2015  . Prerenal azotemia 03/12/2015  . Pain   . Recurrent UTI--due to Klebsiella   . Paraplegia at T4 level ([place]) 02/20/2015  . Neurogenic bowel 02/20/2015  . Neurogenic bladder 02/20/2015  . Postoperative anemia due to acute blood loss 02/17/2015  . Spinal cord compression due to malignant neoplasm metastatic to spine ([place]) 02/15/2015  . Epidural mass 02/15/2015  . Thoracic spine tumor   . Pathologic fracture of vertebra 02/14/2015  . Pathologic fracture of thoracic vertebrae 02/14/2015  . Breast cancer metastasized to bone ([place]) 02/14/2015  . Hyperlipidemia 02/14/2015  . H. pylori infection   . HTN (hypertension) 04/17/2012  . Primary cancer of upper outer quadrant of right female breast ([place]) 03/08/2012   Laureen Abrahams, PT, DPT 09/29/2015 2:12 PM  [facility] [address] [place], [place], [address] Phone: [phone]   Fax:  [phone]  Name: Heather [name] MRN: [id number] Date of Birth: 02-20-1946

## 2015-10-02 ENCOUNTER — Ambulatory Visit: Payer: Medicare Other | Admitting: Physical Therapy

## 2015-10-02 DIAGNOSIS — M6281 Muscle weakness (generalized): Secondary | ICD-10-CM | POA: Diagnosis not present

## 2015-10-02 DIAGNOSIS — R293 Abnormal posture: Secondary | ICD-10-CM | POA: Diagnosis not present

## 2015-10-02 DIAGNOSIS — R2681 Unsteadiness on feet: Secondary | ICD-10-CM

## 2015-10-02 DIAGNOSIS — H8111 Benign paroxysmal vertigo, right ear: Secondary | ICD-10-CM | POA: Diagnosis not present

## 2015-10-02 DIAGNOSIS — R2689 Other abnormalities of gait and mobility: Secondary | ICD-10-CM

## 2015-10-02 NOTE — Therapy (Signed)
[facility] [address] [place], [place], [address] Phone: [phone]   Fax:  [phone]  Physical Therapy Treatment  Patient Details  Name: Heather [name] MRN: [id number] Date of Birth: 1945-10-05 Referring Provider: Alger Simons, MD  Encounter Date: 10/02/2015      PT End of Session - 10/02/15 1020    Visit Number 34   Number of Visits 40   Date for PT Re-Evaluation 10/16/15   PT Start Time 0930   PT Stop Time 1012   PT Time Calculation (min) 42 min   Equipment Utilized During Treatment Gait belt   Activity Tolerance Patient tolerated treatment well   Behavior During Therapy [facility] for tasks assessed/performed      Past Medical History  Diagnosis Date  . Chronic kidney disease   . Hx: UTI (urinary tract infection)   . Right shoulder pain   . Right knee pain   . Hypertension     recently started Aldactone   . Hyperlipidemia     but not on meds;diet and exercise controlled  . Dizziness     has been going on for 62month;medical MD aware  . H/O hiatal hernia   . GERD (gastroesophageal reflux disease)     Tums prn  . Hemorrhoids   . Urinary frequency     d/t taking Aldactone  . History of UTI   . Anemia     hx of  . Cataract     immature-not sure of which eye  . Anxiety     r/t updated surgery  . Insomnia     takes Melatoniin daily  . Breast cancer ([place]     right  . Skin cancer   . Hx of radiation therapy 10/25/12- 12/11/12    right chest wall/regional lymph nodes 5040 cGy, 28 sessions, right chest wall boost 1000 cGy 1 session  . H. pylori infection   . Metastasis to spinal column (HCC)     T5  . Complication of anesthesia     pt states she is sensitive to meds  . PONV (postoperative nausea and vomiting)     pt experienced hair loss, confusion and combative- 02/15/15    Past Surgical History  Procedure Laterality Date  . Cyst removed from left breast  1970  . Left wrist surgery   2004   with plate  . Colonoscopy    . Esophagogastroduodenoscopy    . Mastectomy, radical    . Mastectomy modified radical  05/10/2012    Procedure: MASTECTOMY MODIFIED RADICAL;  Surgeon: PMerrie Roof MD;  [facility]  Service: General;  Laterality: Right;  RIGHT MODIFIED RADICAL MASTECTOMY  . Portacath placement  05/10/2012    Procedure: INSERTION PORT-A-CATH;  Surgeon: PMerrie Roof MD;  [facility]  Service: General;  Laterality: Left;  . Decompressive lumbar laminectomy level 4 N/A 02/15/2015    Procedure: DECOMPRESSION T5 AND T3-T7 STABALIZATION;  Surgeon: NConsuella Lose MD;  [facility];  Service: Neurosurgery;  Laterality: N/A;  . Laminectomy N/A 03/27/2015    Procedure: Thoracic Four-Thoracic Six Laminectomy for tumor;  Surgeon: NConsuella Lose MD;  [facility];  Service: Neurosurgery;  Laterality: N/A;  T4-T6 Laminectomy    There were no vitals filed for this visit.      Subjective Assessment - 10/02/15 0934    Subjective doing well - dizziness is much better   Patient Stated Goals improve mobility; walk   Currently  in Pain? No/denies                Vestibular Assessment - 10/02/15 1011    Positional Testing   Sidelying Test Sidelying Right   Sidelying Right   Sidelying Right Duration 6 sec; decr from last session   Sidelying Right Symptoms Upbeat, right rotatory nystagmus                 [facility] Adult PT Treatment/Exercise - 10/02/15 1013    Ambulation/Gait   Ambulation/Gait Yes   Ambulation/Gait Assistance 4: Min guard   Ambulation Distance (Feet) 750 Feet   Assistive device Lofstrands   Gait Pattern Step-through pattern   Gait Comments gait with cues for hip hike for hip stability and to decrease trendelenburg gait; transitioned to L loftstrand only and pt holding R loftstrand off ground in prep for gait with single device   Knee/Hip Exercises: Standing   Other Standing Knee Exercises standing hip hiking multiple  reps and sustained holds bil         Vestibular Treatment/Exercise - 10/02/15 1012     EPLEY MANUEVER RIGHT   Number of Reps  1   Overall Response Improved Symptoms   Response Details  modified epley's starting with sidelying test                 PT Short Term Goals - 09/18/15 1031    PT SHORT TERM GOAL #5   Title amb > 300' with LRAD with supervision for improved function and mobility (09/18/15)   Status Achieved   PT SHORT TERM GOAL #6   Title improve gait velocity to > 2.0 ft/sec for improved function and mobility (09/18/15)   Baseline met with RW; progressing well towards goal with loftstrands   Status Achieved   PT SHORT TERM GOAL #7   Title perform BERG balance test with appropriate STG and LTGs to be written (09/18/15)   Baseline LTG only   Status Achieved   PT SHORT TERM GOAL #8   Title perform sit to/from stand x 5 reps without UE support for improved strength and function (09/18/15)   Status Achieved           PT Long Term Goals - 09/11/15 1035    PT LONG TERM GOAL #5   Title ambulate > 500' on various indoor/outdoor surfaces modified independent for improved function and mobility with LRAD (10/16/15)   Status On-going   PT LONG TERM GOAL #6   Title improve gait velocity to > 2.5 ft/sec for improved function and mobility (10/16/15)   Status On-going   PT LONG TERM GOAL #7   Title BERG:  >/= 38/56     (10/16/15)   Status Revised               Plan - 10/02/15 1021    Clinical Impression Statement Pt continues to progress well with PT and began trial of amb with single loftstrand crutch today.  Bil hip weakness resulting in trendelenburg gait with pt concerned about balance with single crutch.  Educated on how to improve hip stability and exercises for home.   PT Next Visit Plan continue gait;  LE strengthening; monitor vertigo PRN   Consulted and Agree with Plan of Care Patient      Patient will benefit from skilled therapeutic intervention in order  to improve the following deficits and impairments:     Visit Diagnosis: Other abnormalities of gait and mobility  Muscle weakness (generalized)  Unsteadiness  on feet  Abnormal posture  BPPV (benign paroxysmal positional vertigo), right     Problem List Patient Active Problem List   Diagnosis Date Noted  . Anemia of chronic disease 05/21/2015  . Metastatic cancer to spine ([place]) 03/27/2015  . Thrombocytopenia ([place]) 03/12/2015  . Prerenal azotemia 03/12/2015  . Pain   . Recurrent UTI--due to Klebsiella   . Paraplegia at T4 level ([place]) 02/20/2015  . Neurogenic bowel 02/20/2015  . Neurogenic bladder 02/20/2015  . Postoperative anemia due to acute blood loss 02/17/2015  . Spinal cord compression due to malignant neoplasm metastatic to spine ([place]) 02/15/2015  . Epidural mass 02/15/2015  . Thoracic spine tumor   . Pathologic fracture of vertebra 02/14/2015  . Pathologic fracture of thoracic vertebrae 02/14/2015  . Breast cancer metastasized to bone ([place]) 02/14/2015  . Hyperlipidemia 02/14/2015  . H. pylori infection   . HTN (hypertension) 04/17/2012  . Primary cancer of upper outer quadrant of right female breast ([place]) 03/08/2012   Laureen Abrahams, PT, DPT 10/02/2015 10:23 AM  [facility] [address] [place], [place], [address] Phone: [phone]   Fax:  [phone]  Name: AARYA ROBINSON MRN: [id number] Date of Birth: [birth year]

## 2015-10-07 ENCOUNTER — Other Ambulatory Visit (HOSPITAL_COMMUNITY): Payer: Self-pay | Admitting: Internal Medicine

## 2015-10-07 ENCOUNTER — Ambulatory Visit (HOSPITAL_COMMUNITY)
Admission: RE | Admit: 2015-10-07 | Discharge: 2015-10-07 | Disposition: A | Discharge status: Home / Self Care | Payer: Medicare Other | Source: Ambulatory Visit | Attending: Internal Medicine | Admitting: Internal Medicine

## 2015-10-07 ENCOUNTER — Ambulatory Visit (HOSPITAL_BASED_OUTPATIENT_CLINIC_OR_DEPARTMENT_OTHER)
Admission: RE | Admit: 2015-10-07 | Discharge: 2015-10-07 | Disposition: A | Discharge status: Home / Self Care | Payer: Medicare Other | Source: Ambulatory Visit | Attending: Internal Medicine | Admitting: Internal Medicine

## 2015-10-07 VITALS — BP 142/82 | HR 71 | Wt 134.2 lb

## 2015-10-07 DIAGNOSIS — C7951 Secondary malignant neoplasm of bone: Secondary | ICD-10-CM

## 2015-10-07 DIAGNOSIS — Z09 Encounter for follow-up examination after completed treatment for conditions other than malignant neoplasm: Secondary | ICD-10-CM | POA: Diagnosis present

## 2015-10-07 DIAGNOSIS — C50411 Malignant neoplasm of upper-outer quadrant of right female breast: Secondary | ICD-10-CM

## 2015-10-07 DIAGNOSIS — I10 Essential (primary) hypertension: Secondary | ICD-10-CM

## 2015-10-07 DIAGNOSIS — C50911 Malignant neoplasm of unspecified site of right female breast: Secondary | ICD-10-CM | POA: Diagnosis not present

## 2015-10-07 DIAGNOSIS — I340 Nonrheumatic mitral (valve) insufficiency: Secondary | ICD-10-CM | POA: Diagnosis not present

## 2015-10-07 DIAGNOSIS — E785 Hyperlipidemia, unspecified: Secondary | ICD-10-CM | POA: Diagnosis not present

## 2015-10-07 NOTE — Addendum Note (Signed)
Encounter addended by: Scarlette Calico, RN on: 10/07/2015 11:29 AM<BR>     Documentation filed: Dx Association, Patient Instructions Section, Orders

## 2015-10-07 NOTE — Progress Notes (Addendum)
CARDIO-ONCOLOGY CLINIC NOTE  Patient ID: Heather [name], female   DOB: Nov 03, 1945, 70 y.o.   MRN: [id number]  Referring Oncologist: Dr Humphrey Rolls Oncologistt: Dr Humphrey Rolls PCP: Dr [name] General Surgeon: Dr Marlou Starks  HPI: Heather [name] is a 70 year old female with invasive ductal carcinoma in R breast that is ER positive and HER-2/neu positive,  S/P R mastectomy and lymph node dissection 05/10/12.    She has been treated for HTN in the past but stopped taking the medications due to elevated renal function. Intolerant Ace-I due to cough.    She received a total of 6 cycles of Taxotere/carboplatin starting on 06/14/2012 and finishing in May. XRT finished in August 2014. Now receiving Herceptin every 3 weeks to end at the end of February 2015.    She finished initial Herceptin 2015 but unfortunately then developed pathological vertebral fracture with cord compression and paralysis. Now back on Herceptin since 12/16.  Has been doing rehab. Now able to walk with walker and braces. No HF symptoms.     Primary cancer of upper outer quadrant of right female breast ([place])   03/08/2012 Initial Diagnosis Right breast invasive ductal carcinoma ER positive PR negative HER-2 positive Ki-67 44%; another breast mass biopsied in the anterior part of the breast which was also positive for malignancy that was HER-2 negative   05/10/2012 Surgery Right mastectomy and axillary lymph node dissection: Multifocal disease 5 cm, 1.7 cm, 1.6 cm, ER positive PR negative HER-2 positive Ki-67 44%, 3/7 lymph nodes positive   06/14/2012 - 06/12/2013 Chemotherapy Adjuvant chemotherapy with [place] 6 followed by Herceptin maintenance   10/25/2012 - 12/11/2012 Radiation Therapy Adjuvant radiation therapy   02/07/2013 -  Anti-estrogen oral therapy Letrozole 2.5 mg daily   02/14/2015 Imaging MRI spine: Large destructive T5 lesion with severe pathologic compression fracture and extensive epidural tumor, severe spinal stenosis with  moderate cord compression, tumor involvement of T4   03/18/2015 PET scan Residual enhancing soft tissue adjacent to the spinal cord. No evidence of metastatic disease. Nonspecific uptake the left nipple   03/27/2015 - 03/30/2015 Hospital Admission T4-6 decompression for spinal cord compression (lower extremity paralysis)   04/10/2015 -  Chemotherapy Palliative treatment with Herceptin every 3 weeks with letrozole 2.5 mg daily   04/13/2015 - 05/19/2015 Radiation Therapy Palliative radiation treatment to the spine   09/01/2015 Imaging CT chest abdomen pelvis: Pathologic fracture with posterior fusion at T5 no other evidence of metastatic disease in the chest abdomen pelvis         03/28/12 ECHO 60-65% Lateral S' 9.3 09/17/12 ECHO 55% lateral s' 9.4 03/05/13 ECHO EF 55% lateral s' 11.1 1/15 ECHO EF 60-65%, lateral s' 11.3, global longitudinal strain -22.9% 8/15 Echo EF 60% lateral s 11.2, GLS -25% 10/07/2015  ECHO EF 55-60% laterals s' 8.7 cm/s GLS -21.1% mild MR. Grade I DD  Labs (1/15): K 3.9, creatinine 1.0 Labs  (7/15) K 4.2 Cr 1.2  Past Medical History  Diagnosis Date  . Chronic kidney disease   . Hx: UTI (urinary tract infection)   . Right shoulder pain   . Right knee pain   . Hypertension     recently started Aldactone   . Hyperlipidemia     but not on meds;diet and exercise controlled  . Dizziness     has been going on for 57month;medical MD aware  . H/O hiatal hernia   . GERD (gastroesophageal reflux disease)     Tums prn  . Hemorrhoids   . Urinary frequency  d/t taking Aldactone  . History of UTI   . Anemia     hx of  . Cataract     immature-not sure of which eye  . Anxiety     r/t updated surgery  . Insomnia     takes Melatoniin daily  . Breast cancer ([place])     right  . Skin cancer   . Hx of radiation therapy 10/25/12- 12/11/12    right chest wall/regional lymph nodes 5040 cGy, 28 sessions, right chest wall boost 1000 cGy 1 session  . H.  pylori infection   . Metastasis to spinal column ([facility])     T5  . Complication of anesthesia     pt states she is sensitive to meds  . PONV (postoperative nausea and vomiting)     pt experienced hair loss, confusion and combative- 02/15/15    Current Outpatient Prescriptions  Medication Sig Dispense Refill  . aspirin 81 MG tablet Take 81 mg by mouth daily.    Marland Kitchen b complex vitamins tablet Take 1 tablet by mouth daily.    . Calcium Carbonate Antacid (TUMS PO) Take 2 tablets by mouth 2 (two) times daily as needed (acid reflux).    . Cholecalciferol (VITAMIN D) 2000 UNITS tablet Take 2,000 Units by mouth daily.    . Coenzyme Q10 (COQ10) 100 MG CAPS Take 100 mg by mouth at bedtime.    . gabapentin (NEURONTIN) 600 MG tablet Take 1 tablet (600 mg total) by mouth 3 (three) times daily. 90 tablet 3  . letrozole (FEMARA) 2.5 MG tablet Take 1 tablet (2.5 mg total) by mouth daily. 90 tablet 1  . lidocaine-prilocaine (EMLA) cream Apply to affected area once (Patient taking differently: Apply 1 application topically every 21 ( twenty-one) days. Apply to port prior to infusions) 30 g 3  . methocarbamol (ROBAXIN) 500 MG tablet Take 1 tablet (500 mg total) by mouth daily as needed for muscle spasms. 75 tablet 0  . OVER THE COUNTER MEDICATION Place 1 drop into both eyes 2 (two) times daily as needed (dry eyes). Over the counter lubricating eye drops    . polyethylene glycol (MIRALAX / GLYCOLAX) packet Take 17 g by mouth 2 (two) times daily. (Patient taking differently: Take 8.5 g by mouth daily as needed (constipation). ) 100 each 0  . pravastatin (PRAVACHOL) 10 MG tablet Take 10 mg by mouth at bedtime.     . Probiotic Product (PROBIOTIC DAILY PO) Take 1 capsule by mouth 2 (two) times daily.     . prochlorperazine (COMPAZINE) 10 MG tablet Take 1 tablet (10 mg total) by mouth every 6 (six) hours as needed for nausea or vomiting. 30 tablet 0  . ranitidine (ZANTAC) 300 MG tablet Take 300 mg by mouth at bedtime.  Reported on 06/25/2015    . spironolactone (ALDACTONE) 25 MG tablet Take 0.5 tablets (12.5 mg total) by mouth daily. 45 tablet 12  . traMADol (ULTRAM) 50 MG tablet Take 1 tablet (50 mg total) by mouth every 6 (six) hours as needed for moderate pain. 60 tablet 0  . Wound Cleansers (RADIAPLEX EX) Apply topically. Reported on 06/25/2015    . exemestane (AROMASIN) 25 MG tablet Take 1 tablet (25 mg total) by mouth daily after breakfast. (Patient not taking: Reported on 10/07/2015) 90 tablet 3   No current facility-administered medications for this encounter.     Allergies  Allergen Reactions  . Codeine     " loopy".   02/26/15: Also patient does  not want to take oxycodone either (makes her feel like a "zombie")  . Keflex [Cephalexin]     GI Upset  . Naprosyn [Naproxen] Other (See Comments)    GI ipset   . Oxycodone Other (See Comments)    Made her feel like a "zombie" 02/25/15. Patient does not want to take oxycodone.  Cecil Cranker [Guaifenesin] Other (See Comments)    Dizzy     Social History   Social History  . Marital Status: Married    Spouse Name: N/A  . Number of Children: 2  . Years of Education: N/A   Occupational History  . Not on file.   Social History Main Topics  . Smoking status: Never Smoker   . Smokeless tobacco: Never Used  . Alcohol Use: No  . Drug Use: No  . Sexual Activity: Yes    Birth Control/ Protection: Post-menopausal   Other Topics Concern  . Not on file   Social History Narrative    Family History  Problem Relation Age of Onset  . Leukemia Mother   . Colon cancer Brother   . Cancer Brother     Prostate  . Colon cancer Maternal Grandmother   . Stroke Father   . Diabetes Mellitus II Father     PHYSICAL EXAM: Filed Vitals:   10/07/15 1026  BP: 142/82  Pulse: 71   General:  Well appearing. No respiratory difficulty. Sitting in WC HEENT: normal Neck: supple. no JVD. Carotids 2+ bilat; no bruits. No lymphadenopathy or thryomegaly  appreciated. Cor: PMI nondisplaced. Regular rate & rhythm. No rubs, gallops or murmurs. L port-a-cath Lungs: clear Abdomen: soft, nontender, nondistended. No hepatosplenomegaly. No bruits or masses. Good bowel sounds. Extremities: no cyanosis, clubbing, rash, no lower extremity edema. Braces on legs.  Neuro: alert & oriented x 3, cranial nerves grossly intact. moves all 4 extremities w/o difficulty. Affect pleasant.   ASSESSMENT & PLAN: 1. R breast CA with bone mets -initial Herceptin completed in 2015. -pathological bone fracture 2016. Palliative Herceptin starter 12/16. - I reviewed the echo today.  EF and doppler parameters stable.  No CHF on exam.  - Will repeat echo q 4month for the first year. If therapy extends beyond that and no signs of cardio-toxicity will extend screening interval to every 6 months, 2. HTN: BP stable, continue current regimen.   Flavia Bruss,MD 11:01 AM

## 2015-10-07 NOTE — Progress Notes (Signed)
  Echocardiogram 2D Echocardiogram has been performed.  Jennette Dubin 10/07/2015, 9:48 AM

## 2015-10-07 NOTE — Addendum Note (Signed)
Encounter addended by: Jolaine Artist, MD on: 10/07/2015 11:26 AM<BR>     Documentation filed: Notes Section

## 2015-10-07 NOTE — Patient Instructions (Signed)
Your physician recommends that you schedule a follow-up appointment in: 3 months with echocardiogram  

## 2015-10-12 ENCOUNTER — Encounter: Payer: Self-pay | Admitting: Physical Therapy

## 2015-10-12 ENCOUNTER — Ambulatory Visit: Payer: Medicare Other | Attending: Physical Medicine & Rehabilitation | Admitting: Physical Therapy

## 2015-10-12 DIAGNOSIS — R2681 Unsteadiness on feet: Secondary | ICD-10-CM | POA: Diagnosis not present

## 2015-10-12 DIAGNOSIS — R2689 Other abnormalities of gait and mobility: Secondary | ICD-10-CM | POA: Diagnosis not present

## 2015-10-12 DIAGNOSIS — R293 Abnormal posture: Secondary | ICD-10-CM | POA: Diagnosis not present

## 2015-10-12 DIAGNOSIS — M6281 Muscle weakness (generalized): Secondary | ICD-10-CM | POA: Insufficient documentation

## 2015-10-12 NOTE — Patient Instructions (Signed)
Hip Abduction: Side-Lying (Single Leg)    Lie on side with knees bent,  Raise top leg, keeping knee bent. Repeat _10_ times per set. Repeat on other side. Do _2_ sets per session.  KEEP Elbow down Stay rolled forward.  http://tub.exer.us/44   Copyright  VHI. All rights reserved.  Bridging    Slowly raise buttocks from floor [place], keeping stomach tight. Repeat _10___ times per set. Do _2___ sets per session. Do ___1_ sessions per day.  http://orth.exer.us/1097   Copyright  VHI. All rights reserved.

## 2015-10-12 NOTE — Therapy (Signed)
[facility] [address] [place], [place], [address] Phone: [phone]   Fax:  [phone]  Physical Therapy Treatment  Patient Details  Name: Heather [name] MRN: [id number] Date of Birth: Aug 09, 1945 Referring Provider: Alger Simons, MD  Encounter Date: 10/12/2015      PT End of Session - 10/12/15 1017    Visit Number 35   Number of Visits 40   Date for PT Re-Evaluation 10/16/15   PT Start Time 0936   PT Stop Time 1015   PT Time Calculation (min) 39 min   Equipment Utilized During Treatment Gait belt   Activity Tolerance Patient tolerated treatment well   Behavior During Therapy [facility] for tasks assessed/performed      Past Medical History  Diagnosis Date  . Chronic kidney disease   . Hx: UTI (urinary tract infection)   . Right shoulder pain   . Right knee pain   . Hypertension     recently started Aldactone   . Hyperlipidemia     but not on meds;diet and exercise controlled  . Dizziness     has been going on for 11month;medical MD aware  . H/O hiatal hernia   . GERD (gastroesophageal reflux disease)     Tums prn  . Hemorrhoids   . Urinary frequency     d/t taking Aldactone  . History of UTI   . Anemia     hx of  . Cataract     immature-not sure of which eye  . Anxiety     r/t updated surgery  . Insomnia     takes Melatoniin daily  . [place]     right  . Skin cancer   . Hx of radiation therapy 10/25/12- 12/11/12    right chest wall/regional lymph nodes 5040 cGy, 28 sessions, right chest wall boost 1000 cGy 1 session  . H. pylori infection   . Metastasis to spinal column ([facility])     T5  . Complication of anesthesia     pt states she is sensitive to meds  . PONV (postoperative nausea and vomiting)     pt experienced hair loss, confusion and combative- 02/15/15    Past Surgical History  Procedure Laterality Date  . Cyst removed from left breast  1970  . Left wrist surgery   2004     with plate  . Colonoscopy    . Esophagogastroduodenoscopy    . Mastectomy, radical    . Mastectomy modified radical  05/10/2012    Procedure: MASTECTOMY MODIFIED RADICAL;  Surgeon: PMerrie Roof MD;  [facility]  Service: General;  Laterality: Right;  RIGHT MODIFIED RADICAL MASTECTOMY  . Portacath placement  05/10/2012    Procedure: INSERTION PORT-A-CATH;  Surgeon: PMerrie Roof MD;  [facility]  Service: General;  Laterality: Left;  . Decompressive lumbar laminectomy level 4 N/A 02/15/2015    Procedure: DECOMPRESSION T5 AND T3-T7 STABALIZATION;  Surgeon: NConsuella Lose MD;  [facility];  Service: Neurosurgery;  Laterality: N/A;  . Laminectomy N/A 03/27/2015    Procedure: Thoracic Four-Thoracic Six Laminectomy for tumor;  Surgeon: NConsuella Lose MD;  [facility];  Service: Neurosurgery;  Laterality: N/A;  T4-T6 Laminectomy    There were no vitals filed for this visit.      Subjective Assessment - 10/12/15 0938    Subjective Reports doing a recumbent bike- " May have over done it." Reports doing 819mand feeling  dizzy afterwards. No falls since last visit.   Patient Stated Goals improve mobility; walk   Currently in Pain? No/denies                         [facility] Adult PT Treatment/Exercise - 10/12/15 0001    Knee/Hip Exercises: Standing   Other Standing Knee Exercises standing hip hiking multiple reps and sustained holds bil  standing with one foot on step, 10x each gave exercise for HEP   Knee/Hip Exercises: Sidelying   Hip ADduction Limitations    Bridging 10x2 each  cues for technique.   Moving knees slowly in/out 10x2 to increase glut medius involvement.                PT Education - 10/12/15 1002    Education provided Yes   Education Details Add exercises to HEP to address Hip glut medius and abductor strengthening. Discussed POC, plan to have PT assess d/c vs continued visits.   Person(s) Educated Patient    Methods Explanation   Comprehension Verbalized understanding          PT Short Term Goals - 09/18/15 1031    PT SHORT TERM GOAL #5   Title amb > 300' with LRAD with supervision for improved function and mobility (09/18/15)   Status Achieved   PT SHORT TERM GOAL #6   Title improve gait velocity to > 2.0 ft/sec for improved function and mobility (09/18/15)   Baseline met with RW; progressing well towards goal with loftstrands   Status Achieved   PT SHORT TERM GOAL #7   Title perform BERG balance test with appropriate STG and LTGs to be written (09/18/15)   Baseline LTG only   Status Achieved   PT SHORT TERM GOAL #8   Title perform sit to/from stand x 5 reps without UE support for improved strength and function (09/18/15)   Status Achieved           PT Long Term Goals - 09/11/15 1035    PT LONG TERM GOAL #5   Title ambulate > 500' on various indoor/outdoor surfaces modified independent for improved function and mobility with LRAD (10/16/15)   Status On-going   PT LONG TERM GOAL #6   Title improve gait velocity to > 2.5 ft/sec for improved function and mobility (10/16/15)   Status On-going   PT LONG TERM GOAL #7   Title BERG:  >/= 38/56     (10/16/15)   Status Revised               Plan - 10/12/15 1012    Clinical Impression Statement Skilled session focused Bil hip abductor and glut medius strengthening, adding to HEP, Pt responding well to instruction.   Rehab Potential Good   PT Frequency 2x / week   PT Duration 4 weeks   PT Treatment/Interventions ADLs/Self Care Home Management;Electrical Stimulation;Therapeutic exercise;Therapeutic activities;Functional mobility training;Stair training;Gait training;DME Instruction;Balance training;Neuromuscular re-education;Patient/family education;Orthotic Fit/Training;Canalith Repostioning   PT Next Visit Plan continue gait; Core and LE strengthening; monitor vertigo PRN   Consulted and Agree with Plan of Care Patient       Patient will benefit from skilled therapeutic intervention in order to improve the following deficits and impairments:  Abnormal gait, Difficulty walking, Pain, Impaired sensation, Decreased strength, Decreased mobility, Decreased balance  Visit Diagnosis: Other abnormalities of gait and mobility     Problem List Patient Active Problem List   Diagnosis Date Noted  . Anemia of  chronic disease 05/21/2015  . Metastatic cancer to spine ([place]) 03/27/2015  . Thrombocytopenia ([place]) 03/12/2015  . Prerenal azotemia 03/12/2015  . Pain   . Recurrent UTI--due to Klebsiella   . Paraplegia at T4 level ([place]) 02/20/2015  . Neurogenic bowel 02/20/2015  . Neurogenic bladder 02/20/2015  . Postoperative anemia due to acute blood loss 02/17/2015  . Spinal cord compression due to malignant neoplasm metastatic to spine ([place]) 02/15/2015  . Epidural mass 02/15/2015  . Thoracic spine tumor   . Pathologic fracture of vertebra 02/14/2015  . Pathologic fracture of thoracic vertebrae 02/14/2015  . Breast cancer metastasized to bone ([place]) 02/14/2015  . Hyperlipidemia 02/14/2015  . H. pylori infection   . HTN (hypertension) 04/17/2012  . Primary cancer of upper outer quadrant of right female breast ([place]) 03/08/2012    Bjorn Loser, PTA  10/12/2015, 4:27 PM [facility] [address] [place], [place], [address] Phone: [phone]   Fax:  [phone]  Name: Heather [name] MRN: [id number] Date of Birth: November 05, 1945

## 2015-10-14 ENCOUNTER — Ambulatory Visit: Payer: Medicare Other | Admitting: Physical Therapy

## 2015-10-14 DIAGNOSIS — R2689 Other abnormalities of gait and mobility: Secondary | ICD-10-CM

## 2015-10-14 DIAGNOSIS — R2681 Unsteadiness on feet: Secondary | ICD-10-CM

## 2015-10-14 DIAGNOSIS — M6281 Muscle weakness (generalized): Secondary | ICD-10-CM | POA: Diagnosis not present

## 2015-10-14 DIAGNOSIS — R293 Abnormal posture: Secondary | ICD-10-CM | POA: Diagnosis not present

## 2015-10-14 NOTE — Therapy (Signed)
[facility] [address] [place], [place], [address] Phone: [phone]   Fax:  [phone]  Physical Therapy Treatment  Patient Details  Name: Heather [name] MRN: [id number] Date of Birth: [birth year] Referring Provider: Alger Simons, MD  Encounter Date: 10/14/2015      PT End of Session - 10/14/15 1001    Visit Number 36   Number of Visits 32  Requesting additional 12 sessions   Date for PT Re-Evaluation 12/13/15   Authorization Type Medicare Traditional primary; BCBS secondary      Authorization Time Period * KX Modified required;    G Codes and progress note required every 10 visits   PT Start Time [id number]   PT Stop Time 0940   PT Time Calculation (min) 54 min   Equipment Utilized During Treatment Gait belt   Activity Tolerance Patient tolerated treatment well   Behavior During Therapy [facility] for tasks assessed/performed      Past Medical History  Diagnosis Date  . Chronic kidney disease   . Hx: UTI (urinary tract infection)   . Right shoulder pain   . Right knee pain   . Hypertension     recently started Aldactone   . Hyperlipidemia     but not on meds;diet and exercise controlled  . Dizziness     has been going on for 62month;medical MD aware  . H/O hiatal hernia   . GERD (gastroesophageal reflux disease)     Tums prn  . Hemorrhoids   . Urinary frequency     d/t taking Aldactone  . History of UTI   . Anemia     hx of  . Cataract     immature-not sure of which eye  . Anxiety     r/t updated surgery  . Insomnia     takes Melatoniin daily  . Breast cancer ([place]     right  . Skin cancer   . Hx of radiation therapy 10/25/12- 12/11/12    right chest wall/regional lymph nodes 5040 cGy, 28 sessions, right chest wall boost 1000 cGy 1 session  . H. pylori infection   . Metastasis to spinal column (HCC)     T5  . Complication of anesthesia     pt states she is sensitive to meds  . PONV (postoperative  nausea and vomiting)     pt experienced hair loss, confusion and combative- 02/15/15    Past Surgical History  Procedure Laterality Date  . Cyst removed from left breast  1970  . Left wrist surgery   2004    with plate  . Colonoscopy    . Esophagogastroduodenoscopy    . Mastectomy, radical    . Mastectomy modified radical  05/10/2012    Procedure: MASTECTOMY MODIFIED RADICAL;  Surgeon: PMerrie Roof MD;  [facility]  Service: General;  Laterality: Right;  RIGHT MODIFIED RADICAL MASTECTOMY  . Portacath placement  05/10/2012    Procedure: INSERTION PORT-A-CATH;  Surgeon: PMerrie Roof MD;  [facility]  Service: General;  Laterality: Left;  . Decompressive lumbar laminectomy level 4 N/A 02/15/2015    Procedure: DECOMPRESSION T5 AND T3-T7 STABALIZATION;  Surgeon: NConsuella Lose MD;  [facility];  Service: Neurosurgery;  Laterality: N/A;  . Laminectomy N/A 03/27/2015    Procedure: Thoracic Four-Thoracic Six Laminectomy for tumor;  Surgeon: NConsuella Lose MD;  [facility];  Service: Neurosurgery;  Laterality: N/A;  T4-T6 Laminectomy  There were no vitals filed for this visit.      Subjective Assessment - 10/14/15 0852    Subjective Pt reports no falls, no significant changes. See below for patient-stated goals for PT.    Pertinent History No spinal restrictions.   Limitations --   How long can you walk comfortably? --   Patient Stated Goals 6/7: "To be able to get down on ground to garden and weed; to walk without my braces - or at least make my braces hinged."   Currently in Pain? No/denies            [facility] PT Assessment - 10/14/15 0001    Assessment   Medical Diagnosis Paraplegia at T4 level   Referring Provider Alger Simons, MD   Onset Date/Surgical Date 02/16/15   Merrilee Jansky Balance Test   Sit to Stand Able to stand without using hands and stabilize independently   Standing Unsupported Able to stand safely 2 minutes   Sitting with  Back Unsupported but Feet Supported on Floor or Stool Able to sit safely and securely 2 minutes   Stand to Sit Sits safely with minimal use of hands   Transfers Able to transfer safely, minor use of hands   Standing Unsupported with Eyes Closed Able to stand 10 seconds with supervision   Standing Ubsupported with Feet Together Able to place feet together independently and stand 1 minute safely   From Standing, Reach Forward with Outstretched Arm Can reach confidently >25 cm (10")   From Standing Position, Pick up Object from Floor Able to pick up shoe, needs supervision   From Standing Position, Turn to Look Behind Over each Shoulder Looks behind from both sides and weight shifts well   Turn 360 Degrees Needs assistance while turning  single LOB following second turn   Standing Unsupported, Alternately Place Feet on Step/Stool Able to complete >2 steps/needs minimal assist   Standing Unsupported, One Foot in Front Able to take small step independently and hold 30 seconds   Standing on One Leg Tries to lift leg/unable to hold 3 seconds but remains standing independently   Total Score 42                     [facility] Adult PT Treatment/Exercise - 10/14/15 0001    Ambulation/Gait   Ambulation/Gait Yes   Ambulation/Gait Assistance 6: Modified independent (Device/Increase time);5: Supervision;4: Min guard   Ambulation/Gait Assistance Details x1000' over unlevel, paved surfaces with mod I using B forearm crutches; required (S) to min guard ofr gait x100' over unlevel, grass surfaces.   Ambulation Distance (Feet) 1100 Feet   Assistive device R Forearm Crutch;L Forearm Crutch   Gait Pattern Step-through pattern;Trendelenburg  minimal B ankle PF during respective terminal stance   Ambulation Surface Level;Unlevel;Indoor;Outdoor;Paved;Grass                PT Education - 10/14/15 1642    Education provided Yes   Education Details PT goals, findings, progress, updated/new goals,  and POC.    Person(s) Educated Patient   Methods Explanation   Comprehension Verbalized understanding          PT Short Term Goals - 10/14/15 1003    Additional Short Term Goals   Additional Short Term Goals Yes   PT SHORT TERM GOAL #9   TITLE Pt will improve Berg score to > 45/56 to indicate decreased risk of falling.   (11/11/15)   Time 4   Period Weeks  Status New   PT SHORT TERM GOAL #10   TITLE Pt will transfer from standing to/from tall kneeling on ground using standard chair requiring min A to progress toward pt ability to garden. (11/11/15)   Time 4   Period Weeks   Status New   PT SHORT TERM GOAL #11   TITLE Pt will negotiate standard ramp and curb step with mod I using LRAD to indicate safety traversing community obstacles. (11/11/15)   Time 4   Period Weeks   Status New   PT SHORT TERM GOAL #12   TITLE Pt will demo B ankle plantar flexion strength of 2+/5 (partial ROM heel raise against gravity) to progress toward pt-stated goal of progressing to hinged AFO's.  (11/11/15)   Status New           PT Long Term Goals - 10/14/15 3893    PT LONG TERM GOAL #5   Title ambulate > 500' on various indoor/outdoor surfaces modified independent for improved function and mobility with LRAD (10/16/15)   Status On-going   Additional Long Term Goals   Additional Long Term Goals Yes   PT LONG TERM GOAL #6   Title improve gait velocity to > 2.5 ft/sec for improved function and mobility (10/16/15)   Baseline 6/7: gait velocity = 2.32 ft/sec  Continue goal through renewed POC.   Status Partially Met   PT LONG TERM GOAL #7   Title BERG:  >/= 38/56     (10/16/15)   Baseline Achieved 6/7 with Berg score of 42/56.   Status Achieved   PT LONG TERM GOAL #8   Title Pt will ambulate >100' over unlevel, grass surfaces with mod I using LRAD to enable pt to safely traverse yard at home. (12/09/15)   Time 8   Period Weeks   Status New   PT LONG TERM GOAL  #9   TITLE Pt will transfer from standing  to/from tall kneeling on ground using standard chair with mod I to enable pt to safely return to gardening.  (12/09/15)   Time 8   Period Weeks   Status New   PT LONG TERM GOAL  #10   TITLE Pt will demo B ankle plantar flexion strength of 3/5 (full ROM heel raise against gravity) to address pt-stated goal of progressing to hinged AFO's.  (12/09/15)   Time 8   Period Weeks   Status New               Plan - 10/14/15 1649    Clinical Impression Statement Since beginning this episode of outpatient PT, pt has demonstrated significant improvement in gait stability, LE strength, endurance/activity tolerance, and standing balance, as indicated by Berg score increased from 31/56 to 42/56. Pt has demonstrated excellent motivation and compliance and continues to demonstrate measurable improvement. Therefore, pt will benefit from skilled outpatient PT 2x/week for 4 weeks followed by 1x/week for subsequent 4 weeks to continue to improve standing balance, decrease fall risk, facilitate pt participation in leisure activities (gardening), maximize stability/independence with functional mobility, and progress toward PLOF.     Rehab Potential Good   Clinical Impairments Affecting Rehab Potential excellent progress with mobility since beginning outpatient PT   PT Frequency Other (comment)  2x/week for 4 weeks followed by 1x/week for subseuqent 4 weeks   PT Treatment/Interventions ADLs/Self Care Home Management;Electrical Stimulation;Therapeutic exercise;Therapeutic activities;Functional mobility training;Stair training;Gait training;DME Instruction;Balance training;Neuromuscular re-education;Patient/family education;Orthotic Fit/Training;Canalith Repostioning;Vestibular   PT Next Visit Plan Standing balance with decreased UE  reliance; dynamic standing/gait over unlevel surfaces. Consider adding the following to HEP: corner balance with EC, attempt heel raises without AFO's on.   Consulted and Agree with Plan of  Care Patient      Patient will benefit from skilled therapeutic intervention in order to improve the following deficits and impairments:  Abnormal gait, Difficulty walking, Pain, Impaired sensation, Decreased strength, Decreased mobility, Decreased balance, Dizziness, Decreased coordination  Visit Diagnosis: Other abnormalities of gait and mobility - Plan: PT plan of care cert/re-cert  Muscle weakness (generalized) - Plan: PT plan of care cert/re-cert  Unsteadiness on feet - Plan: PT plan of care cert/re-cert     Problem List Patient Active Problem List   Diagnosis Date Noted  . Anemia of chronic disease 05/21/2015  . Metastatic cancer to spine ([place]) 03/27/2015  . Thrombocytopenia ([place]) 03/12/2015  . Prerenal azotemia 03/12/2015  . Pain   . Recurrent UTI--due to Klebsiella   . Paraplegia at T4 level ([place]) 02/20/2015  . Neurogenic bowel 02/20/2015  . Neurogenic bladder 02/20/2015  . Postoperative anemia due to acute blood loss 02/17/2015  . Spinal cord compression due to malignant neoplasm metastatic to spine ([place]) 02/15/2015  . Epidural mass 02/15/2015  . Thoracic spine tumor   . Pathologic fracture of vertebra 02/14/2015  . Pathologic fracture of thoracic vertebrae 02/14/2015  . Breast cancer metastasized to bone ([place]) 02/14/2015  . Hyperlipidemia 02/14/2015  . H. pylori infection   . HTN (hypertension) 04/17/2012  . Primary cancer of upper outer quadrant of right female breast ([place]) 03/08/2012   Billie Ruddy, PT, [facility] [address] [place], [place], [address] Phone: [phone]   Fax:  [phone] 10/14/2015, 5:01 PM  Name: Heather [name] MRN: [id number] Date of Birth: 08-16-1945

## 2015-10-15 ENCOUNTER — Other Ambulatory Visit (HOSPITAL_BASED_OUTPATIENT_CLINIC_OR_DEPARTMENT_OTHER): Payer: Medicare Other

## 2015-10-15 ENCOUNTER — Ambulatory Visit (HOSPITAL_BASED_OUTPATIENT_CLINIC_OR_DEPARTMENT_OTHER): Payer: Medicare Other

## 2015-10-15 ENCOUNTER — Other Ambulatory Visit: Payer: Self-pay

## 2015-10-15 ENCOUNTER — Telehealth: Payer: Self-pay | Admitting: Hematology and Oncology

## 2015-10-15 ENCOUNTER — Ambulatory Visit: Payer: Medicare Other

## 2015-10-15 VITALS — BP 151/80 | HR 73 | Temp 97.1°F | Resp 18

## 2015-10-15 DIAGNOSIS — Z5112 Encounter for antineoplastic immunotherapy: Secondary | ICD-10-CM

## 2015-10-15 DIAGNOSIS — C7951 Secondary malignant neoplasm of bone: Secondary | ICD-10-CM

## 2015-10-15 DIAGNOSIS — C50411 Malignant neoplasm of upper-outer quadrant of right female breast: Secondary | ICD-10-CM

## 2015-10-15 DIAGNOSIS — C50911 Malignant neoplasm of unspecified site of right female breast: Secondary | ICD-10-CM

## 2015-10-15 DIAGNOSIS — Z95828 Presence of other vascular implants and grafts: Secondary | ICD-10-CM

## 2015-10-15 LAB — COMPREHENSIVE METABOLIC PANEL
ALT: 19 U/L (ref 0–55)
AST: 23 U/L (ref 5–34)
Albumin: 3.7 g/dL (ref 3.5–5.0)
Alkaline Phosphatase: 68 U/L (ref 40–150)
Anion Gap: 10 mEq/L (ref 3–11)
BUN: 15.2 mg/dL (ref 7.0–26.0)
CO2: 26 mEq/L (ref 22–29)
Calcium: 9.3 mg/dL (ref 8.4–10.4)
Chloride: 96 mEq/L — ABNORMAL LOW (ref 98–109)
Creatinine: 0.8 mg/dL (ref 0.6–1.1)
EGFR: 79 mL/min/{1.73_m2} — ABNORMAL LOW (ref >90)
Glucose: 93 mg/dl (ref 70–140)
Potassium: 4.2 mEq/L (ref 3.5–5.1)
Sodium: 132 mEq/L — ABNORMAL LOW (ref 136–145)
Total Bilirubin: 0.57 mg/dL (ref 0.20–1.20)
Total Protein: 7.1 g/dL (ref 6.4–8.3)

## 2015-10-15 LAB — CBC WITH DIFFERENTIAL/PLATELET
BASO%: 0.6 % (ref 0.0–2.0)
Basophils Absolute: 0 10*3/uL (ref 0.0–0.1)
EOS%: 2.1 % (ref 0.0–7.0)
Eosinophils Absolute: 0.1 10*3/uL (ref 0.0–0.5)
HCT: 36.9 % (ref 34.8–46.6)
HGB: 12.1 g/dL (ref 11.6–15.9)
LYMPH%: 13.3 % — ABNORMAL LOW (ref 14.0–49.7)
MCH: 26.9 pg (ref 25.1–34.0)
MCHC: 32.8 g/dL (ref 31.5–36.0)
MCV: 82 fL (ref 79.5–101.0)
MONO#: 0.5 10*3/uL (ref 0.1–0.9)
MONO%: 10.2 % (ref 0.0–14.0)
NEUT#: 3.5 10*3/uL (ref 1.5–6.5)
NEUT%: 73.8 % (ref 38.4–76.8)
Platelets: 169 10*3/uL (ref 145–400)
RBC: 4.5 10*6/uL (ref 3.70–5.45)
RDW: 14.3 % (ref 11.2–14.5)
WBC: 4.7 10*3/uL (ref 3.9–10.3)
lymph#: 0.6 10*3/uL — ABNORMAL LOW (ref 0.9–3.3)

## 2015-10-15 MED ORDER — SODIUM CHLORIDE 0.9% FLUSH
10.0000 mL | INTRAVENOUS | Status: DC | PRN
  Administered 2015-10-15: 10 mL via INTRAVENOUS
  Filled 2015-10-15: qty 10

## 2015-10-15 MED ORDER — DIPHENHYDRAMINE HCL 25 MG PO CAPS
25.0000 mg | ORAL_CAPSULE | Freq: Once | ORAL | Status: AC
  Administered 2015-10-15: 25 mg via ORAL

## 2015-10-15 MED ORDER — SODIUM CHLORIDE 0.9 % IJ SOLN
10.0000 mL | INTRAMUSCULAR | Status: DC | PRN
  Administered 2015-10-15: 10 mL
  Filled 2015-10-15: qty 10

## 2015-10-15 MED ORDER — DIPHENHYDRAMINE HCL 25 MG PO CAPS
ORAL_CAPSULE | ORAL | Status: AC
  Filled 2015-10-15: qty 1

## 2015-10-15 MED ORDER — HEPARIN SOD (PORK) LOCK FLUSH 100 UNIT/ML IV SOLN
500.0000 [IU] | Freq: Once | INTRAVENOUS | Status: AC | PRN
  Administered 2015-10-15: 500 [IU]
  Filled 2015-10-15: qty 5

## 2015-10-15 MED ORDER — ACETAMINOPHEN 325 MG PO TABS
ORAL_TABLET | ORAL | Status: AC
  Filled 2015-10-15: qty 2

## 2015-10-15 MED ORDER — ACETAMINOPHEN 325 MG PO TABS
650.0000 mg | ORAL_TABLET | Freq: Once | ORAL | Status: AC
  Administered 2015-10-15: 650 mg via ORAL

## 2015-10-15 MED ORDER — TRASTUZUMAB CHEMO INJECTION 440 MG
6.0000 mg/kg | Freq: Once | INTRAVENOUS | Status: AC
  Administered 2015-10-15: 399 mg via INTRAVENOUS
  Filled 2015-10-15: qty 19

## 2015-10-15 MED ORDER — SODIUM CHLORIDE 0.9 % IV SOLN
Freq: Once | INTRAVENOUS | Status: AC
  Administered 2015-10-15: 10:00:00 via INTRAVENOUS

## 2015-10-15 NOTE — Telephone Encounter (Signed)
appt made and avs printed °

## 2015-10-15 NOTE — Patient Instructions (Signed)
[facility] Discharge Instructions for Patients Receiving Chemotherapy  Today you received the following chemotherapy agents:  Herceptin  To help prevent nausea and vomiting after your treatment, we encourage you to take your nausea medication as prescribed.   If you develop nausea and vomiting that is not controlled by your nausea medication, call the clinic.   BELOW ARE SYMPTOMS THAT SHOULD BE REPORTED IMMEDIATELY:  *FEVER GREATER THAN 100.5 F  *CHILLS WITH OR WITHOUT FEVER  NAUSEA AND VOMITING THAT IS NOT CONTROLLED WITH YOUR NAUSEA MEDICATION  *UNUSUAL SHORTNESS OF BREATH  *UNUSUAL BRUISING OR BLEEDING  TENDERNESS IN MOUTH AND THROAT WITH OR WITHOUT PRESENCE OF ULCERS  *URINARY PROBLEMS  *BOWEL PROBLEMS  UNUSUAL RASH Items with * indicate a potential emergency and should be followed up as soon as possible.  Feel free to call the clinic you have any questions or concerns. The clinic phone number is [phone].  Please show the CHEMO ALERT CARD at check-in to the Emergency Department and triage nurse.   

## 2015-10-16 ENCOUNTER — Other Ambulatory Visit: Payer: Self-pay | Admitting: Physical Medicine & Rehabilitation

## 2015-10-19 ENCOUNTER — Ambulatory Visit: Payer: Medicare Other | Admitting: Physical Therapy

## 2015-10-19 DIAGNOSIS — M6281 Muscle weakness (generalized): Secondary | ICD-10-CM

## 2015-10-19 DIAGNOSIS — R2681 Unsteadiness on feet: Secondary | ICD-10-CM

## 2015-10-19 DIAGNOSIS — R293 Abnormal posture: Secondary | ICD-10-CM | POA: Diagnosis not present

## 2015-10-19 DIAGNOSIS — R2689 Other abnormalities of gait and mobility: Secondary | ICD-10-CM | POA: Diagnosis not present

## 2015-10-19 NOTE — Therapy (Signed)
[facility] [address] [place], [place], [address] Phone: [phone]   Fax:  [phone]  Physical Therapy Treatment  Patient Details  Name: Heather [name] MRN: [id number] Date of Birth: 08/01/1945 Referring Provider: Alger Simons, MD  Encounter Date: 10/19/2015      PT End of Session - 10/19/15 1018    Visit Number 37   Number of Visits 61  Requesting additional 12 sessions   Date for PT Re-Evaluation 12/13/15   Authorization Type Medicare Traditional primary; BCBS secondary      Authorization Time Period * KX Modified required;    G Codes and progress note required every 10 visits   PT Start Time 0933   PT Stop Time 1015   PT Time Calculation (min) 42 min   Equipment Utilized During Treatment Gait belt   Activity Tolerance Patient tolerated treatment well   Behavior During Therapy [facility] for tasks assessed/performed      Past Medical History  Diagnosis Date  . Chronic kidney disease   . Hx: UTI (urinary tract infection)   . Right shoulder pain   . Right knee pain   . Hypertension     recently started Aldactone   . Hyperlipidemia     but not on meds;diet and exercise controlled  . Dizziness     has been going on for 69month;medical MD aware  . H/O hiatal hernia   . GERD (gastroesophageal reflux disease)     Tums prn  . Hemorrhoids   . Urinary frequency     d/t taking Aldactone  . History of UTI   . Anemia     hx of  . Cataract     immature-not sure of which eye  . Anxiety     r/t updated surgery  . Insomnia     takes Melatoniin daily  . Breast cancer ([place]     right  . Skin cancer   . Hx of radiation therapy 10/25/12- 12/11/12    right chest wall/regional lymph nodes 5040 cGy, 28 sessions, right chest wall boost 1000 cGy 1 session  . H. pylori infection   . Metastasis to spinal column (HCC)     T5  . Complication of anesthesia     pt states she is sensitive to meds  . PONV (postoperative  nausea and vomiting)     pt experienced hair loss, confusion and combative- 02/15/15    Past Surgical History  Procedure Laterality Date  . Cyst removed from left breast  1970  . Left wrist surgery   2004    with plate  . Colonoscopy    . Esophagogastroduodenoscopy    . Mastectomy, radical    . Mastectomy modified radical  05/10/2012    Procedure: MASTECTOMY MODIFIED RADICAL;  Surgeon: PMerrie Roof MD;  [facility]  Service: General;  Laterality: Right;  RIGHT MODIFIED RADICAL MASTECTOMY  . Portacath placement  05/10/2012    Procedure: INSERTION PORT-A-CATH;  Surgeon: PMerrie Roof MD;  [facility]  Service: General;  Laterality: Left;  . Decompressive lumbar laminectomy level 4 N/A 02/15/2015    Procedure: DECOMPRESSION T5 AND T3-T7 STABALIZATION;  Surgeon: NConsuella Lose MD;  [facility];  Service: Neurosurgery;  Laterality: N/A;  . Laminectomy N/A 03/27/2015    Procedure: Thoracic Four-Thoracic Six Laminectomy for tumor;  Surgeon: NConsuella Lose MD;  [facility];  Service: Neurosurgery;  Laterality: N/A;  T4-T6 Laminectomy  There were no vitals filed for this visit.      Subjective Assessment - 10/19/15 0938    Subjective Pt reports no falls. Has been taking some steps without AD.   Pertinent History No spinal restrictions.   Limitations House hold activities;Standing;Walking   How long can you walk comfortably? first time 2/13: 30-40 ft with RW   Patient Stated Goals 6/7: "To be able to get down on ground to garden and weed; to walk without my braces - or at least make my braces hinged."   Currently in Pain? No/denies                            [facility]) - 10/19/15 1003  QUADRUPED   PWR! Up x10   PWR! Rock x10   PWR! Twist x10   PWR! Step x10   Comments cues for body mechanics and sequencing          Balance Exercises - 10/19/15 0946    Balance Exercises: Standing   Other Standing Exercises floor  transfer: working in sequence using external support  supervision level. Progressed with decreased UE support and trialled without AFOs; pt was able to get up without external support, with close supervision.           PT Education - 10/19/15 1011    Education provided Yes   Education Details PWR! Moves in quadruped to increase core, hip strength and balance, and floor transfer;  fall risk involved with walking without AD.   Person(s) Educated Patient   Methods Explanation;Demonstration;Tactile cues;Verbal cues;Handout   Comprehension Verbalized understanding;Returned demonstration;Verbal cues required;Tactile cues required;Need further instruction          PT Short Term Goals - 10/14/15 1003    Additional Short Term Goals   Additional Short Term Goals Yes   PT SHORT TERM GOAL #9   TITLE Pt will improve Berg score to > 45/56 to indicate decreased risk of falling.   (11/11/15)   Time 4   Period Weeks   Status New   PT SHORT TERM GOAL #10   TITLE Pt will transfer from standing to/from tall kneeling on ground using standard chair requiring min A to progress toward pt ability to garden. (11/11/15)   Time 4   Period Weeks   Status New   PT SHORT TERM GOAL #11   TITLE Pt will negotiate standard ramp and curb step with mod I using LRAD to indicate safety traversing community obstacles. (11/11/15)   Time 4   Period Weeks   Status New   PT SHORT TERM GOAL #12   TITLE Pt will demo B ankle plantar flexion strength of 2+/5 (partial ROM heel raise against gravity) to progress toward pt-stated goal of progressing to hinged AFO's.  (11/11/15)   Status New           PT Long Term Goals - 10/14/15 0973    PT LONG TERM GOAL #5   Title ambulate > 500' on various indoor/outdoor surfaces modified independent for improved function and mobility with LRAD (10/16/15)   Status On-going   Additional Long Term Goals   Additional Long Term Goals Yes   PT LONG TERM GOAL #6   Title improve gait velocity  to > 2.5 ft/sec for improved function and mobility (10/16/15)   Baseline 6/7: gait velocity = 2.32 ft/sec  Continue goal through renewed POC.   Status Partially Met   PT LONG TERM GOAL #  7   Title BERG:  >/= 38/56     (10/16/15)   Baseline Achieved 6/7 with Berg score of 42/56.   Status Achieved   PT LONG TERM GOAL #8   Title Pt will ambulate >100' over unlevel, grass surfaces with mod I using LRAD to enable pt to safely traverse yard at home. (12/09/15)   Time 8   Period Weeks   Status New   PT LONG TERM GOAL  #9   TITLE Pt will transfer from standing to/from tall kneeling on ground using standard chair with mod I to enable pt to safely return to gardening.  (12/09/15)   Time 8   Period Weeks   Status New   PT LONG TERM GOAL  #10   TITLE Pt will demo B ankle plantar flexion strength of 3/5 (full ROM heel raise against gravity) to address pt-stated goal of progressing to hinged AFO's.  (12/09/15)   Time 8   Period Weeks   Status New               Plan - 10/19/15 1012    Clinical Impression Statement Progressed core, hip, strengthening and balance training with quadruped exercises; performs gait with bil AFOs at min guard level without device and supervision level with floor transfers with external support.   Rehab Potential Good   Clinical Impairments Affecting Rehab Potential excellent progress with mobility since beginning outpatient PT   PT Frequency Other (comment)  2x/week for 4 weeks followed by 1x/week for subseuqent 4 weeks   PT Treatment/Interventions ADLs/Self Care Home Management;Electrical Stimulation;Therapeutic exercise;Therapeutic activities;Functional mobility training;Stair training;Gait training;DME Instruction;Balance training;Neuromuscular re-education;Patient/family education;Orthotic Fit/Training;Canalith Repostioning;Vestibular   PT Next Visit Plan Standing balance with decreased UE reliance; dynamic standing/gait over unlevel surfaces. Consider adding the  following to HEP: corner balance with EC, attempt heel raises without AFO's on.   Consulted and Agree with Plan of Care Patient      Patient will benefit from skilled therapeutic intervention in order to improve the following deficits and impairments:  Abnormal gait, Difficulty walking, Pain, Impaired sensation, Decreased strength, Decreased mobility, Decreased balance, Dizziness, Decreased coordination  Visit Diagnosis: Other abnormalities of gait and mobility  Muscle weakness (generalized)  Unsteadiness on feet     Problem List Patient Active Problem List   Diagnosis Date Noted  . Anemia of chronic disease 05/21/2015  . Metastatic cancer to spine ([place]) 03/27/2015  . Thrombocytopenia ([place]) 03/12/2015  . Prerenal azotemia 03/12/2015  . Pain   . Recurrent UTI--due to Klebsiella   . Paraplegia at T4 level ([place]) 02/20/2015  . Neurogenic bowel 02/20/2015  . Neurogenic bladder 02/20/2015  . Postoperative anemia due to acute blood loss 02/17/2015  . Spinal cord compression due to malignant neoplasm metastatic to spine ([place]) 02/15/2015  . Epidural mass 02/15/2015  . Thoracic spine tumor   . Pathologic fracture of vertebra 02/14/2015  . Pathologic fracture of thoracic vertebrae 02/14/2015  . Breast cancer metastasized to bone ([place]) 02/14/2015  . Hyperlipidemia 02/14/2015  . H. pylori infection   . HTN (hypertension) 04/17/2012  . Primary cancer of upper outer quadrant of right female breast ([place]) 03/08/2012    Bjorn Loser, PTA  10/19/2015, 10:57 AM [facility] [address] [place], [place], [address] Phone: [phone]   Fax:  [phone]  Name: CHONTE RICKE MRN: [id number] Date of Birth: [birth year]

## 2015-10-21 ENCOUNTER — Telehealth: Payer: Self-pay | Admitting: Physical Medicine & Rehabilitation

## 2015-10-21 ENCOUNTER — Ambulatory Visit: Payer: Medicare Other | Admitting: Physical Therapy

## 2015-10-21 DIAGNOSIS — R293 Abnormal posture: Secondary | ICD-10-CM | POA: Diagnosis not present

## 2015-10-21 DIAGNOSIS — R2689 Other abnormalities of gait and mobility: Secondary | ICD-10-CM

## 2015-10-21 DIAGNOSIS — R2681 Unsteadiness on feet: Secondary | ICD-10-CM

## 2015-10-21 DIAGNOSIS — M6281 Muscle weakness (generalized): Secondary | ICD-10-CM

## 2015-10-21 MED ORDER — GABAPENTIN 600 MG PO TABS: 600.0000 mg | ORAL_TABLET | Freq: Three times a day (TID) | ORAL | Status: DC

## 2015-10-21 NOTE — Telephone Encounter (Signed)
Can you check on this for me and if appropriate, send RX? Please and thank you

## 2015-10-21 NOTE — Telephone Encounter (Signed)
Patient needs a refill on Gabapentin called into her pharmacy [facility] in [place].  Please call patient when this is done, [phone].

## 2015-10-21 NOTE — Telephone Encounter (Signed)
Gabapentin reordered.

## 2015-10-21 NOTE — Therapy (Deleted)
[facility] [address] [place], [place], [address] Phone: [phone]   Fax:  [phone]  Patient Details  Name: Heather [name] MRN: [id number] Date of Birth: 1945-09-04 Referring Provider:  [name], MD  Encounter Date: 10/21/2015   Bjorn Loser 10/21/2015, 10:17 AM  [facility] [address] [place], [place], [address] Phone: [phone]   Fax:  [phone]

## 2015-10-21 NOTE — Therapy (Signed)
[facility] [address] [place], [place], [address] Phone: [phone]   Fax:  [phone]  Physical Therapy Treatment  Patient Details  Name: Heather [name] MRN: [id number] Date of Birth: 31-Oct-1945 Referring Provider: Alger Simons, MD  Encounter Date: 10/21/2015      PT End of Session - 10/21/15 1013    Visit Number 38   Number of Visits 71  Requesting additional 12 sessions   Date for PT Re-Evaluation 12/13/15   Authorization Type Medicare Traditional primary; BCBS secondary      Authorization Time Period * KX Modified required;    G Codes and progress note required every 10 visits   PT Start Time 0932   PT Stop Time 1012   PT Time Calculation (min) 40 min   Equipment Utilized During Treatment Gait belt   Activity Tolerance Patient tolerated treatment well   Behavior During Therapy [facility] for tasks assessed/performed      Past Medical History  Diagnosis Date  . Chronic kidney disease   . Hx: UTI (urinary tract infection)   . Right shoulder pain   . Right knee pain   . Hypertension     recently started Aldactone   . Hyperlipidemia     but not on meds;diet and exercise controlled  . Dizziness     has been going on for 21month;medical MD aware  . H/O hiatal hernia   . GERD (gastroesophageal reflux disease)     Tums prn  . Hemorrhoids   . Urinary frequency     d/t taking Aldactone  . History of UTI   . Anemia     hx of  . Cataract     immature-not sure of which eye  . Anxiety     r/t updated surgery  . Insomnia     takes Melatoniin daily  . [facility] [place]     right  . Skin cancer   . Hx of radiation therapy 10/25/12- 12/11/12    right chest wall/regional lymph nodes 5040 cGy, 28 sessions, right chest wall boost 1000 cGy 1 session  . H. pylori infection   . Metastasis to spinal column (HCC)     T5  . Complication of anesthesia     pt states she is sensitive to meds  . PONV (postoperative  nausea and vomiting)     pt experienced hair loss, confusion and combative- 02/15/15    Past Surgical History  Procedure Laterality Date  . Cyst removed from left breast  1970  . Left wrist surgery   2004    with plate  . Colonoscopy    . Esophagogastroduodenoscopy    . Mastectomy, radical    . Mastectomy modified radical  05/10/2012    Procedure: MASTECTOMY MODIFIED RADICAL;  Surgeon: PMerrie Roof MD;  [place]  Service: General;  Laterality: Right;  RIGHT MODIFIED RADICAL MASTECTOMY  . Portacath placement  05/10/2012    Procedure: INSERTION PORT-A-CATH;  Surgeon: PMerrie Roof MD;  [place]  Service: General;  Laterality: Left;  . Decompressive lumbar laminectomy level 4 N/A 02/15/2015    Procedure: DECOMPRESSION T5 AND T3-T7 STABALIZATION;  Surgeon: NConsuella Lose MD;  [facility];  Service: Neurosurgery;  Laterality: N/A;  . Laminectomy N/A 03/27/2015    Procedure: Thoracic Four-Thoracic Six Laminectomy for tumor;  Surgeon: NConsuella Lose MD;  [facility];  Service: Neurosurgery;  Laterality: N/A;  T4-T6 Laminectomy  There were no vitals filed for this visit.      Subjective Assessment - 10/21/15 0934    Subjective "I might be slower today due to not sleeping well last couple of nights."  Some muscle soreness from last session; heating pad helped.   Pertinent History No spinal restrictions.   Limitations House hold activities;Standing;Walking   How long can you walk comfortably? first time 2/13: 30-40 ft with RW   Patient Stated Goals 6/7: "To be able to get down on ground to garden and weed; to walk without my braces - or at least make my braces hinged."   Currently in Pain? No/denies                         [facility] Adult PT Treatment/Exercise - 10/21/15 0001    Ambulation/Gait   Ambulation/Gait Yes   Ambulation/Gait Assistance 5: Supervision   Ambulation/Gait Assistance Details Practising balance with 1  crutch, trialling crutch in each arm  demonstrated greater balance with crutch in L hand   Ambulation Distance (Feet) 345 Feet   Assistive device L Axillary Crutch;R Axillary Crutch  Bil AFOs   Gait Pattern Step-through pattern;Trendelenburg   Ambulation Surface Level   Pre-Gait Activities Amb. in parallel bars working on gait mechanics with decreased UE support   1 to no UE support trying to maintaining initial heelstrike and steplength   Gait Comments Trialled gait with SPC, requiring Min guard 230 ft + Step negotiation with SPC and 1 rail, reciprocal pattern ascending and step to pattern descending; 4 steps x1.              Balance Exercises - 10/21/15 1001    Balance Exercises: Standing   Gait with Head Turns Upper extremity support  1 crutch; supervision to min guard           PT Education - 10/21/15 1012    Education provided Yes   Education Details gait with [facility]   Person(s) Educated Patient   Methods Explanation   Comprehension Verbalized understanding          PT Short Term Goals - 10/14/15 1003    Additional Short Term Goals   Additional Short Term Goals Yes   PT SHORT TERM GOAL #9   TITLE Pt will improve Berg score to > 45/56 to indicate decreased risk of falling.   (11/11/15)   Time 4   Period Weeks   Status New   PT SHORT TERM GOAL #10   TITLE Pt will transfer from standing to/from tall kneeling on ground using standard chair requiring min A to progress toward pt ability to garden. (11/11/15)   Time 4   Period Weeks   Status New   PT SHORT TERM GOAL #11   TITLE Pt will negotiate standard ramp and curb step with mod I using LRAD to indicate safety traversing community obstacles. (11/11/15)   Time 4   Period Weeks   Status New   PT SHORT TERM GOAL #12   TITLE Pt will demo B ankle plantar flexion strength of 2+/5 (partial ROM heel raise against gravity) to progress toward pt-stated goal of progressing to hinged AFO's.  (11/11/15)   Status New            PT Long Term Goals - 10/14/15 0867    PT LONG TERM GOAL #5   Title ambulate > 500' on various indoor/outdoor surfaces modified independent for improved function and mobility with LRAD (10/16/15)  Status On-going   Additional Long Term Goals   Additional Long Term Goals Yes   PT LONG TERM GOAL #6   Title improve gait velocity to > 2.5 ft/sec for improved function and mobility (10/16/15)   Baseline 6/7: gait velocity = 2.32 ft/sec  Continue goal through renewed POC.   Status Partially Met   PT LONG TERM GOAL #7   Title BERG:  >/= 38/56     (10/16/15)   Baseline Achieved 6/7 with Berg score of 42/56.   Status Achieved   PT LONG TERM GOAL #8   Title Pt will ambulate >100' over unlevel, grass surfaces with mod I using LRAD to enable pt to safely traverse yard at home. (12/09/15)   Time 8   Period Weeks   Status New   PT LONG TERM GOAL  #9   TITLE Pt will transfer from standing to/from tall kneeling on ground using standard chair with mod I to enable pt to safely return to gardening.  (12/09/15)   Time 8   Period Weeks   Status New   PT LONG TERM GOAL  #10   TITLE Pt will demo B ankle plantar flexion strength of 3/5 (full ROM heel raise against gravity) to address pt-stated goal of progressing to hinged AFO's.  (12/09/15)   Time 8   Period Weeks   Status New               Plan - 10/21/15 1014    Clinical Impression Statement Progressed gait training with 1 crutch requiring min A to min guard with visual scanning tasks and with SPC requiring Min guard. Pt continues to demonstrate slight trendelenburg gait and bil knee hyperextension during gait with Bil  AFOs donned.   Rehab Potential Good   Clinical Impairments Affecting Rehab Potential excellent progress with mobility since beginning outpatient PT   PT Frequency Other (comment)  2x/week for 4 weeks followed by 1x/week for subseuqent 4 weeks   PT Treatment/Interventions ADLs/Self Care Home Management;Electrical  Stimulation;Therapeutic exercise;Therapeutic activities;Functional mobility training;Stair training;Gait training;DME Instruction;Balance training;Neuromuscular re-education;Patient/family education;Orthotic Fit/Training;Canalith Repostioning;Vestibular   PT Next Visit Plan Standing balance with decreased UE reliance; dynamic standing/gait over unlevel surfaces. Consider adding the following to HEP: corner balance with EC, attempt heel raises without AFO's on.   Consulted and Agree with Plan of Care Patient      Patient will benefit from skilled therapeutic intervention in order to improve the following deficits and impairments:  Abnormal gait, Difficulty walking, Pain, Impaired sensation, Decreased strength, Decreased mobility, Decreased balance, Dizziness, Decreased coordination  Visit Diagnosis: Other abnormalities of gait and mobility  Muscle weakness (generalized)  Unsteadiness on feet     Problem List Patient Active Problem List   Diagnosis Date Noted  . Anemia of chronic disease 05/21/2015  . Metastatic cancer to spine ([place]) 03/27/2015  . Thrombocytopenia ([place]) 03/12/2015  . Prerenal azotemia 03/12/2015  . Pain   . Recurrent UTI--due to Klebsiella   . Paraplegia at T4 level ([place]) 02/20/2015  . Neurogenic bowel 02/20/2015  . Neurogenic bladder 02/20/2015  . Postoperative anemia due to acute blood loss 02/17/2015  . Spinal cord compression due to malignant neoplasm metastatic to spine ([place]) 02/15/2015  . Epidural mass 02/15/2015  . Thoracic spine tumor   . Pathologic fracture of vertebra 02/14/2015  . Pathologic fracture of thoracic vertebrae 02/14/2015  . Breast cancer metastasized to bone ([place]) 02/14/2015  . Hyperlipidemia 02/14/2015  . H. pylori infection   . HTN (hypertension) 04/17/2012  .  Primary cancer of upper outer quadrant of right female breast ([place]) 03/08/2012   Bjorn Loser, PTA  10/21/2015, 11:42 AM  [facility] [address] [place], [place], [address] Phone: [phone]   Fax:  [phone]  Name: Heather [name] MRN: [id number] Date of Birth: 1945/11/08

## 2015-10-22 DIAGNOSIS — C50411 Malignant neoplasm of upper-outer quadrant of right female breast: Secondary | ICD-10-CM | POA: Diagnosis not present

## 2015-10-22 DIAGNOSIS — E785 Hyperlipidemia, unspecified: Secondary | ICD-10-CM | POA: Diagnosis not present

## 2015-10-22 DIAGNOSIS — I10 Essential (primary) hypertension: Secondary | ICD-10-CM | POA: Diagnosis not present

## 2015-10-22 DIAGNOSIS — C7951 Secondary malignant neoplasm of bone: Secondary | ICD-10-CM | POA: Diagnosis not present

## 2015-10-22 DIAGNOSIS — K219 Gastro-esophageal reflux disease without esophagitis: Secondary | ICD-10-CM | POA: Diagnosis not present

## 2015-10-22 DIAGNOSIS — G839 Paralytic syndrome, unspecified: Secondary | ICD-10-CM | POA: Diagnosis not present

## 2015-10-22 DIAGNOSIS — Z1211 Encounter for screening for malignant neoplasm of colon: Secondary | ICD-10-CM | POA: Diagnosis not present

## 2015-10-22 DIAGNOSIS — Z0000 Encounter for general adult medical examination without abnormal findings: Secondary | ICD-10-CM | POA: Diagnosis not present

## 2015-10-22 DIAGNOSIS — E559 Vitamin D deficiency, unspecified: Secondary | ICD-10-CM | POA: Diagnosis not present

## 2015-10-22 DIAGNOSIS — G9520 Unspecified cord compression: Secondary | ICD-10-CM | POA: Diagnosis not present

## 2015-10-26 ENCOUNTER — Ambulatory Visit: Payer: Medicare Other | Admitting: Physical Therapy

## 2015-10-26 DIAGNOSIS — R2689 Other abnormalities of gait and mobility: Secondary | ICD-10-CM | POA: Diagnosis not present

## 2015-10-26 DIAGNOSIS — R293 Abnormal posture: Secondary | ICD-10-CM | POA: Diagnosis not present

## 2015-10-26 DIAGNOSIS — M6281 Muscle weakness (generalized): Secondary | ICD-10-CM | POA: Diagnosis not present

## 2015-10-26 DIAGNOSIS — R2681 Unsteadiness on feet: Secondary | ICD-10-CM | POA: Diagnosis not present

## 2015-10-26 NOTE — Therapy (Addendum)
[facility] [address] [place], [place], [address] Phone: [phone]   Fax:  [phone]  Physical Therapy Treatment  Patient Details  Name: Heather [name] MRN: [id number] Date of Birth: January 11, 1946 Referring Provider: Alger Simons, MD  Encounter Date: 10/26/2015      PT End of Session - 10/26/15 1258    Visit Number 39   Number of Visits 60   Date for PT Re-Evaluation 12/13/15   Authorization Type Medicare Traditional primary; BCBS secondary      Authorization Time Period * KX Modified required;    G Codes and progress note required every 10 visits   PT Start Time 1146   PT Stop Time 1239   PT Time Calculation (min) 53 min   Equipment Utilized During Treatment Gait belt   Activity Tolerance Patient tolerated treatment well   Behavior During Therapy [facility] for tasks assessed/performed      Past Medical History  Diagnosis Date  . Chronic kidney disease   . Hx: UTI (urinary tract infection)   . Right shoulder pain   . Right knee pain   . Hypertension     recently started Aldactone   . Hyperlipidemia     but not on meds;diet and exercise controlled  . Dizziness     has been going on for 53month;medical MD aware  . H/O hiatal hernia   . GERD (gastroesophageal reflux disease)     Tums prn  . Hemorrhoids   . Urinary frequency     d/t taking Aldactone  . History of UTI   . Anemia     hx of  . Cataract     immature-not sure of which eye  . Anxiety     r/t updated surgery  . Insomnia     takes Melatoniin daily  . Breast cancer ([facility]     right  . Skin cancer   . Hx of radiation therapy 10/25/12- 12/11/12    right chest wall/regional lymph nodes 5040 cGy, 28 sessions, right chest wall boost 1000 cGy 1 session  . H. pylori infection   . Metastasis to spinal column (HCC)     T5  . Complication of anesthesia     pt states she is sensitive to meds  . PONV (postoperative nausea and vomiting)     pt  experienced hair loss, confusion and combative- 02/15/15    Past Surgical History  Procedure Laterality Date  . Cyst removed from left breast  1970  . Left wrist surgery   2004    with plate  . Colonoscopy    . Esophagogastroduodenoscopy    . Mastectomy, radical    . Mastectomy modified radical  05/10/2012    Procedure: MASTECTOMY MODIFIED RADICAL;  Surgeon: PMerrie Roof MD;  [place] [place]  Service: General;  Laterality: Right;  RIGHT MODIFIED RADICAL MASTECTOMY  . Portacath placement  05/10/2012    Procedure: INSERTION PORT-A-CATH;  Surgeon: PMerrie Roof MD;  [place] [facility] [facility]  Service: General;  Laterality: Left;  . Decompressive lumbar laminectomy level 4 N/A 02/15/2015    Procedure: DECOMPRESSION T5 AND T3-T7 STABALIZATION;  Surgeon: NConsuella Lose MD;  [facility];  Service: Neurosurgery;  Laterality: N/A;  . Laminectomy N/A 03/27/2015    Procedure: Thoracic Four-Thoracic Six Laminectomy for tumor;  Surgeon: NConsuella Lose MD;  [facility];  Service: Neurosurgery;  Laterality: N/A;  T4-T6 Laminectomy    There were no vitals filed  for this visit.      Subjective Assessment - 10/26/15 1153    Subjective Pt reports she went up "a lot of stairs" when visiting son this past weekend. Stair negotiation went well, per pt. Also states, "Those yoga exercises are really, really hard for me right now. I've been doing them at home since last session."   Pertinent History No spinal restrictions.   Limitations House hold activities;Standing;Walking   Patient Stated Goals 6/7: "To be able to get down on ground to garden and weed; to walk without my braces - or at least make my braces hinged."   Currently in Pain? No/denies                         [facility] Adult PT Treatment/Exercise - 10/26/15 0001    Ambulation/Gait   Ambulation/Gait Yes   Ambulation/Gait Assistance 5: Supervision;4: Min guard   Ambulation/Gait Assistance Details Gait x115'  without B GRAFO's using RW with noted dec eccentric control of R ankle DF (R foot slap) as well as R genu recuvratum during R mid stance. Performed additional gait trials 2 x115' each with RW and B carbon fiber PLS AFO's, initial trial without heel wedges, subsequent trial with heel wedges x2 in R shoe to control R genu recurvatum. Heel wedges appeared to increase veclocity of R GR rather than control GR.    Ambulation Distance (Feet) 345 Feet   Assistive device Rolling walker   Gait Pattern Step-through pattern;Trendelenburg;Decreased dorsiflexion - right;Right genu recurvatum  See details above.   Ambulation Surface Level;Indoor   Exercises   Exercises Other Exercises   Other Exercises  Standing with BUE support at RW and mirror to R/anterior to pt (for visual feedback of R knee position), pt performed squats x10 with cueing to control R GR at endrange R knee extension. Added yellow Tband for resistance x15 reps with Tband anchored in front of pt, x10 reps with Tband anchored posterior to pt. With verbal instruction and multimodal cueing from PT, pt performed clamshells x15 reps on R, x20 reps on L (to patient fatigue).                 PT Education - 10/26/15 1255    Education provided Yes   Education Details Recommended pt hold quadruped exercises until able to review at next session. Provided HEP for R knee control and B hip ABD strengthening. See Pt Instructions. Per previous conversation about progressing current B custom AFO's by placing hinge in AFO's, this PT corrected self, it would not be appropriate to hinge GRAFO's.    Person(s) Educated Patient   Methods Explanation;Demonstration;Verbal cues;Tactile cues;Handout   Comprehension Verbalized understanding;Returned demonstration          PT Short Term Goals - 10/26/15 1259    PT SHORT TERM GOAL #9   TITLE Pt will improve Berg score to > 45/56 to indicate decreased risk of falling.   (11/11/15)   Time 4   Period Weeks    Status On-going   PT SHORT TERM GOAL #10   TITLE Pt will transfer from standing to/from tall kneeling on ground using standard chair requiring min A to progress toward pt ability to garden. (11/11/15)   Time 4   Period Weeks   Status On-going   PT SHORT TERM GOAL #11   TITLE Pt will negotiate standard ramp and curb step with mod I using LRAD to indicate safety traversing community obstacles. (11/11/15)  Time 4   Period Weeks   Status On-going   PT SHORT TERM GOAL #12   TITLE Pt will demo B ankle plantar flexion strength of 2+/5 (partial ROM heel raise against gravity) to progress toward pt-stated goal of progressing to hinged AFO's.  (11/11/15)   Baseline 6/19: Deferred, as it would not be appropriate to hinge GRAFO's (and ankle PF strength is 4/5 bilaterally)   Status Deferred           PT Long Term Goals - 10/26/15 1300    PT LONG TERM GOAL #5   Title ambulate > 500' on various indoor/outdoor surfaces modified independent for improved function and mobility with LRAD (10/16/15)   Status On-going   PT LONG TERM GOAL #6   Title improve gait velocity to > 2.5 ft/sec for improved function and mobility (10/16/15)   Baseline 6/7: gait velocity = 2.32 ft/sec   Status On-going   PT LONG TERM GOAL #7   Title BERG:  >/= 38/56     (10/16/15)   Baseline Achieved 6/7 with Berg score of 42/56.   Status Achieved   PT LONG TERM GOAL #8   Title Pt will ambulate >100' over unlevel, grass surfaces with mod I using LRAD to enable pt to safely traverse yard at home. (12/09/15)   Time 8   Period Weeks   Status On-going   PT LONG TERM GOAL  #9   TITLE Pt will transfer from standing to/from tall kneeling on ground using standard chair with mod I to enable pt to safely return to gardening.  (12/09/15)   Time 8   Period Weeks   Status On-going   PT LONG TERM GOAL  #10   TITLE Pt will demo B ankle plantar flexion strength of 3/5 (full ROM heel raise against gravity) to address pt-stated goal of progressing to  hinged AFO's.  (12/09/15)   Baseline 6/19: Deferred, as it would not be appropriate to hinge GRAFO's (and ankle PF strength is 4/5 bilaterally)   Time 8   Period Weeks   Status Deferred               Plan - 10/26/15 1319    Clinical Impression Statement Session focused on gait training without B GRAFO's and with B PLS AFO's (carbon fiber). Using PLS AFO's, pt exhibits consistent R genu recurvatum. Therefore, modified current HEP to focus on R knee control. Deferred both short and long term goals for progressing to hinged AFO's as it wouldn't be appropriate to hinge GRAFO's and B ankle PF strength is currently 4/5 bilat.    Rehab Potential Good   Clinical Impairments Affecting Rehab Potential excellent progress with mobility since beginning outpatient PT   PT Frequency Other (comment)  2x/week for 4 weeks followed by 1x/week for subseuqent 4 weeks   PT Treatment/Interventions ADLs/Self Care Home Management;Electrical Stimulation;Therapeutic exercise;Therapeutic activities;Functional mobility training;Stair training;Gait training;DME Instruction;Balance training;Neuromuscular re-education;Patient/family education;Orthotic Fit/Training;Canalith Repostioning;Vestibular   PT Next Visit Plan GCODES and PN. Standing balance with decreased UE reliance; dynamic standing/gait over unlevel surfaces. Consider adding the following to HEP: corner balance with EC, attempt heel raises without AFO's on.   Consulted and Agree with Plan of Care Patient      Patient will benefit from skilled therapeutic intervention in order to improve the following deficits and impairments:  Abnormal gait, Difficulty walking, Pain, Impaired sensation, Decreased strength, Decreased mobility, Decreased balance, Dizziness, Decreased coordination  Visit Diagnosis: Other abnormalities of gait and mobility  Muscle weakness (  generalized)  Unsteadiness on feet     Problem List Patient Active Problem List   Diagnosis Date  Noted  . Anemia of chronic disease 05/21/2015  . Metastatic cancer to spine ([place]) 03/27/2015  . Thrombocytopenia ([place]) 03/12/2015  . Prerenal azotemia 03/12/2015  . Pain   . Recurrent UTI--due to Klebsiella   . Paraplegia at T4 level ([place]) 02/20/2015  . Neurogenic bowel 02/20/2015  . Neurogenic bladder 02/20/2015  . Postoperative anemia due to acute blood loss 02/17/2015  . Spinal cord compression due to malignant neoplasm metastatic to spine ([place]) 02/15/2015  . Epidural mass 02/15/2015  . Thoracic spine tumor   . Pathologic fracture of vertebra 02/14/2015  . Pathologic fracture of thoracic vertebrae 02/14/2015  . Breast cancer metastasized to bone ([place]) 02/14/2015  . Hyperlipidemia 02/14/2015  . H. pylori infection   . HTN (hypertension) 04/17/2012  . Primary cancer of upper outer quadrant of right female breast ([place]) 03/08/2012    Billie Ruddy, PT, DPT [facility] [address] [place], [place], [address] Phone: [phone]   Fax:  [phone] 10/26/2015, 1:23 PM  Name: ANATASIA TINO MRN: [id number] Date of Birth: [birth year]

## 2015-10-26 NOTE — Patient Instructions (Signed)
Knee Extension: Terminal - Standing (Single Leg)   Do this exercise without your braces on. Place your full-length mirror on your RIGHT side to make sure your right knee doesn't go into hyperextension.  Loop your YELLOW resistance band around your right knee. With both hands on your walker, perform a squat, making sure your knees don't move over your toes. Perform 12 reps with husband holding band directly in front of you. Perform 8-10 reps with your husband holding the band behind you.   Perform this exercise 1-2 times per day.   Abduction: Clam (Eccentric) - Side-Lying    Lie on side with knees bent. Lift top knee, keeping feet together. Hold onto edge of bed with top hand to prevent hips from rolling backwards.You should feel this in the muscle on the back of your hip.  Slowly lower for 3-5 seconds.  Perform __20__ reps on your LEFT leg and __15__ reps on your RIGHT.  Do this exercise __1-2__ times per day.

## 2015-10-28 ENCOUNTER — Encounter: Payer: Medicare Other | Attending: Physical Medicine & Rehabilitation | Admitting: Physical Medicine & Rehabilitation

## 2015-10-28 ENCOUNTER — Encounter: Payer: Self-pay | Admitting: Physical Medicine & Rehabilitation

## 2015-10-28 ENCOUNTER — Ambulatory Visit: Payer: Medicare Other | Admitting: Physical Therapy

## 2015-10-28 VITALS — BP 148/86 | HR 74

## 2015-10-28 DIAGNOSIS — Z853 Personal history of malignant neoplasm of breast: Secondary | ICD-10-CM | POA: Insufficient documentation

## 2015-10-28 DIAGNOSIS — I129 Hypertensive chronic kidney disease with stage 1 through stage 4 chronic kidney disease, or unspecified chronic kidney disease: Secondary | ICD-10-CM | POA: Insufficient documentation

## 2015-10-28 DIAGNOSIS — R200 Anesthesia of skin: Secondary | ICD-10-CM | POA: Diagnosis not present

## 2015-10-28 DIAGNOSIS — E785 Hyperlipidemia, unspecified: Secondary | ICD-10-CM | POA: Insufficient documentation

## 2015-10-28 DIAGNOSIS — C7951 Secondary malignant neoplasm of bone: Secondary | ICD-10-CM | POA: Diagnosis not present

## 2015-10-28 DIAGNOSIS — N319 Neuromuscular dysfunction of bladder, unspecified: Secondary | ICD-10-CM | POA: Diagnosis not present

## 2015-10-28 DIAGNOSIS — Z9889 Other specified postprocedural states: Secondary | ICD-10-CM | POA: Diagnosis not present

## 2015-10-28 DIAGNOSIS — Z87440 Personal history of urinary (tract) infections: Secondary | ICD-10-CM | POA: Insufficient documentation

## 2015-10-28 DIAGNOSIS — N189 Chronic kidney disease, unspecified: Secondary | ICD-10-CM | POA: Diagnosis not present

## 2015-10-28 DIAGNOSIS — G839 Paralytic syndrome, unspecified: Secondary | ICD-10-CM | POA: Diagnosis not present

## 2015-10-28 DIAGNOSIS — G9520 Unspecified cord compression: Secondary | ICD-10-CM | POA: Diagnosis not present

## 2015-10-28 DIAGNOSIS — C50919 Malignant neoplasm of unspecified site of unspecified female breast: Secondary | ICD-10-CM | POA: Insufficient documentation

## 2015-10-28 DIAGNOSIS — K219 Gastro-esophageal reflux disease without esophagitis: Secondary | ICD-10-CM | POA: Insufficient documentation

## 2015-10-28 DIAGNOSIS — F419 Anxiety disorder, unspecified: Secondary | ICD-10-CM | POA: Diagnosis not present

## 2015-10-28 DIAGNOSIS — K449 Diaphragmatic hernia without obstruction or gangrene: Secondary | ICD-10-CM | POA: Insufficient documentation

## 2015-10-28 DIAGNOSIS — Z85828 Personal history of other malignant neoplasm of skin: Secondary | ICD-10-CM | POA: Insufficient documentation

## 2015-10-28 DIAGNOSIS — G9529 Other cord compression: Secondary | ICD-10-CM

## 2015-10-28 DIAGNOSIS — G8220 Paraplegia, unspecified: Secondary | ICD-10-CM

## 2015-10-28 MED ORDER — TRAMADOL HCL 50 MG PO TABS: 50.0000 mg | ORAL_TABLET | Freq: Two times a day (BID) | ORAL | Status: DC | PRN

## 2015-10-28 MED ORDER — GABAPENTIN 600 MG PO TABS: 600.0000 mg | ORAL_TABLET | Freq: Three times a day (TID) | ORAL | Status: DC

## 2015-10-28 NOTE — Patient Instructions (Signed)
PLEASE CALL ME WITH ANY PROBLEMS OR QUESTIONS ([phone])   RETURN TO DRIVING PLAN:  WITH THE SUPERVISION OF A LICENSED DRIVER, PLEASE DRIVE IN AN EMPTY PARKING LOT FOR AT LEAST 2-3 TRIALS TO TEST REACTION TIME, VISION, USE OF EQUIPMENT IN CAR, ETC.  IF SUCCESSFUL WITH THE PARKING LOT DRIVING, PROCEED TO SUPERVISED DRIVING TRIALS IN YOUR NEIGHBORHOOD STREETS AT LOW TRAFFIC TIMES TO TEST OBSERVATION TO TRAFFIC SIGNALS, REACTION TIME, ETC. PLEASE ATTEMPT AT LEAST 2-3 TRIALS IN YOUR NEIGHBORHOOD.  IF NEIGHBORHOOD DRIVING IS SUCCESSFUL, YOU MAY PROCEED TO DRIVING IN BUSIER AREAS IN YOUR COMMUNITY WITH SUPERVISION OF A LICENSED DRIVER. PLEASE ATTEMPT AT LEAST 4-5 TRIALS.  IF COMMUNITY DRIVING IS SUCCESSFUL, YOU MAY PROCEED TO DRIVING ALONE, DURING THE DAY TIME, IN NON-PEAK TRAFFIC TIMES. YOU SHOULD DRIVE NO FURTHER THAN 15 MINUTES IN ONE DIRECTION. PLEASE DO NOT DRIVE IF YOU FEEL FATIGUED OR UNDER THE INFLUENCE OF MEDICATION.

## 2015-10-28 NOTE — Progress Notes (Signed)
Subjective:    Patient ID: Heather [name], female    DOB: 06-Nov-1945, 70 y.o.   MRN: [id number]  HPI   Heather [name] is here in follow up of her myelopathy and gait disorder. She is now walking in loftstrand crutches using her bilateral plastic AFO's. Her knee control is improving but she cannot safely walk without the orthoses.   From a pain standpoint she is feeling much better. She is using tramadol occasionally. Most of her pain is a band like sensation around her trunk. She has some occasional spasms in the morning only.   Her appetite is good. Bowel and bladder function are stable.   Pain Inventory Average Pain 4 Pain Right Now 2 My pain is sharp and burning  In the last 24 hours, has pain interfered with the following? General activity 2 Relation with others 2 Enjoyment of life 3 What TIME of day is your pain at its worst? daytime Sleep (in general) Good  Pain is worse with: some activites Pain improves with: heat/ice and medication Relief from Meds: 5  Mobility use a walker ability to climb steps?  yes do you drive?  no Do you have any goals in this area?  yes  Function retired I need assistance with the following:  household duties and shopping Do you have any goals in this area?  yes  Neuro/Psych numbness trouble walking spasms  Prior Studies Any changes since last visit?  no  Physicians involved in your care Any changes since last visit?  no   Family History  Problem Relation Age of Onset  . Leukemia Mother   . Colon cancer Brother   . Cancer Brother     Prostate  . Colon cancer Maternal Grandmother   . Stroke Father   . Diabetes Mellitus II Father    Social History   Social History  . Marital Status: Married    Spouse Name: N/A  . Number of Children: 2  . Years of Education: N/A   Social History Main Topics  . Smoking status: Never Smoker   . Smokeless tobacco: Never Used  . Alcohol Use: No  . Drug Use: No  . Sexual Activity: Yes   Birth Control/ Protection: Post-menopausal   Other Topics Concern  . None   Social History Narrative   Past Surgical History  Procedure Laterality Date  . Cyst removed from left breast  1970  . Left wrist surgery   2004    with plate  . Colonoscopy    . Esophagogastroduodenoscopy    . Mastectomy, radical    . Mastectomy modified radical  05/10/2012    Procedure: MASTECTOMY MODIFIED RADICAL;  Surgeon: Merrie Roof, MD;  Location: [facility];  Service: General;  Laterality: Right;  RIGHT MODIFIED RADICAL MASTECTOMY  . Portacath placement  05/10/2012    Procedure: INSERTION PORT-A-CATH;  Surgeon: Merrie Roof, MD;  Location: [facility];  Service: General;  Laterality: Left;  . Decompressive lumbar laminectomy level 4 N/A 02/15/2015    Procedure: DECOMPRESSION T5 AND T3-T7 STABALIZATION;  Surgeon: Consuella Lose, MD;  Location: [facility];  Service: Neurosurgery;  Laterality: N/A;  . Laminectomy N/A 03/27/2015    Procedure: Thoracic Four-Thoracic Six Laminectomy for tumor;  Surgeon: Consuella Lose, MD;  Location: [facility];  Service: Neurosurgery;  Laterality: N/A;  T4-T6 Laminectomy   Past Medical History  Diagnosis Date  . Chronic kidney disease   . Hx: UTI (urinary tract infection)   .  Right shoulder pain   . Right knee pain   . Hypertension     recently started Aldactone   . Hyperlipidemia     but not on meds;diet and exercise controlled  . Dizziness     has been going on for 32month;medical MD aware  . H/O hiatal hernia   . GERD (gastroesophageal reflux disease)     Tums prn  . Hemorrhoids   . Urinary frequency     d/t taking Aldactone  . History of UTI   . Anemia     hx of  . Cataract     immature-not sure of which eye  . Anxiety     r/t updated surgery  . Insomnia     takes Melatoniin daily  . Breast cancer ([place]     right  . Skin cancer   . Hx of radiation therapy 10/25/12- 12/11/12    right chest wall/regional lymph nodes 5040 cGy, 28 sessions, right chest  wall boost 1000 cGy 1 session  . H. pylori infection   . Metastasis to spinal column (HCC)     T5  . Complication of anesthesia     pt states she is sensitive to meds  . PONV (postoperative nausea and vomiting)     pt experienced hair loss, confusion and combative- 02/15/15   BP 148/86 mmHg  Pulse 74  SpO2 98%  Opioid Risk Score:   Fall Risk Score:  `1  Depression screen PHQ 2/9  [facility] 08/20/2015 06/01/2015 05/08/2015 04/07/2015  Decreased Interest 0 1 0 1  Down, Depressed, Hopeless 0 1 0 1  PHQ - 2 Score 0 2 0 2  Altered sleeping - - - 0  Tired, decreased energy - - - 1  Change in appetite - - - 0  Feeling bad or failure about yourself  - - - 1  Trouble concentrating - - - 1  Moving slowly or fidgety/restless - - - 0  Suicidal thoughts - - - 0  PHQ-9 Score - - - 5  Difficult doing work/chores - - - Not difficult at all     Review of Systems  HENT: Negative.   Eyes: Negative.   Respiratory: Negative.   Cardiovascular: Negative.   Gastrointestinal: Negative.   Endocrine: Negative.   Genitourinary: Negative.   Musculoskeletal: Negative.   Skin: Negative.   Allergic/Immunologic: Negative.   Neurological: Positive for weakness and numbness.  Hematological: Negative.   Psychiatric/Behavioral: Negative.        Objective:   Physical Exam Constitutional: She appears well-developed and well-nourished. No distress.  HENT: dentition intact.  Head: Normocephalic and atraumatic.  Mouth/Throat: Oropharynx is clear and moist. No obvious lesions  Eyes: Conjunctivae and EOM are normal. Conj WNL  Neck: Normal range of motion. Neck supple.  Cardiovascular: Normal rate and regular rhythm.  No murmur heard.  Respiratory: Effort normal and breath sounds normal.  GI: Soft. Bowel sounds are normal. She exhibits no distension. There is no tenderness.  Musculoskeletal: She exhibits no tenderness. She has her solid AFO's with patellar support with her today.    Neurological: She is alert and oriented. Strength UE grossly 4+/5 with inhibition proximally due to pain. LLE-- hip flexion 4/5, KE and KF 4/5 ankle dorsiflexion 4/5, plantar flexion 4+/5, RLE--- 4-/5HF, 4-KE, 4- ADF/APF  Sensation remains decreased below T5-6 with decreased proprioception. Walks with a steppage pattern when she doesn't use AFO's. With the AFO's she's quite functional using her crutches. , No resting  tone. DTR's 3+ bilateral LE's.  Skin: Skin is warm and dry. No rash noted. No erythema. Surgical incision closed/dry Psych: anxiety    Assessment & Plan:   Medical Problem List and Plan:  1. Functional deficits secondary to T5 cord compression/myelopathy from pathologic compression fracture, metastatic breast cancer. Repeat decompression 03/22/15  -continue with outpatient therapies. She continues to make great progress.   -continue current AFO for now which is designed for support. Progress to blue rocker AFO at some point? -discussed driving today. Could try driving with husband WITHOUT AFO using a regular shoe. Formal instructions were provided.  -can use KHT's prn.  2. DVT Prophylaxis/Anticoagulation: eliquis  3. Pain Management: . Continue tramadol prn, robaxin prn.  -continue gabapentin '600mg'$  TID for dysesthesias   4. Onc:  -ongoing plan per onc/rad-onc teams  5. Neurogenic bowel and bladder: emptying bowels.  -continue a timed schedule with bladder emptying and a qam bowel program   25 minutes of face to face patient care time were spent during this visit. All questions were encouraged and answered. Follow up in 3 months

## 2015-10-29 ENCOUNTER — Ambulatory Visit: Payer: Medicare Other | Admitting: Physical Therapy

## 2015-10-29 DIAGNOSIS — R293 Abnormal posture: Secondary | ICD-10-CM | POA: Diagnosis not present

## 2015-10-29 DIAGNOSIS — R2689 Other abnormalities of gait and mobility: Secondary | ICD-10-CM

## 2015-10-29 DIAGNOSIS — M6281 Muscle weakness (generalized): Secondary | ICD-10-CM

## 2015-10-29 DIAGNOSIS — R2681 Unsteadiness on feet: Secondary | ICD-10-CM | POA: Diagnosis not present

## 2015-10-29 NOTE — Therapy (Addendum)
[facility] [address] [place], [place], [address] Phone: [phone]   Fax:  [phone]  Physical Therapy Treatment  Patient Details  Name: Heather [name] MRN: [id number] Date of Birth: 1946-02-19 Referring Provider: Alger Simons, MD  Encounter Date: 10/29/2015      PT End of Session - 10/29/15 1303    Visit Number 40   Number of Visits 59   Date for PT Re-Evaluation 12/13/15   Authorization Type Medicare Traditional primary; BCBS secondary      Authorization Time Period * KX Modified required;    G Codes and progress note required every 10 visits   PT Start Time 0934   PT Stop Time 1040   PT Time Calculation (min) 66 min   Equipment Utilized During Treatment Gait belt   Activity Tolerance Patient tolerated treatment well   Behavior During Therapy [facility] for tasks assessed/performed      Past Medical History  Diagnosis Date  . Chronic kidney disease   . Hx: UTI (urinary tract infection)   . Right shoulder pain   . Right knee pain   . Hypertension     recently started Aldactone   . Hyperlipidemia     but not on meds;diet and exercise controlled  . Dizziness     has been going on for 11month;medical MD aware  . H/O hiatal hernia   . GERD (gastroesophageal reflux disease)     Tums prn  . Hemorrhoids   . Urinary frequency     d/t taking Aldactone  . History of UTI   . Anemia     hx of  . Cataract     immature-not sure of which eye  . Anxiety     r/t updated surgery  . Insomnia     takes Melatoniin daily  . Breast cancer ([place]     right  . Skin cancer   . Hx of radiation therapy 10/25/12- 12/11/12    right chest wall/regional lymph nodes 5040 cGy, 28 sessions, right chest wall boost 1000 cGy 1 session  . H. pylori infection   . Metastasis to spinal column (HCC)     T5  . Complication of anesthesia     pt states she is sensitive to meds  . PONV (postoperative nausea and vomiting)     pt  experienced hair loss, confusion and combative- 02/15/15    Past Surgical History  Procedure Laterality Date  . Cyst removed from left breast  1970  . Left wrist surgery   2004    with plate  . Colonoscopy    . Esophagogastroduodenoscopy    . Mastectomy, radical    . Mastectomy modified radical  05/10/2012    Procedure: MASTECTOMY MODIFIED RADICAL;  Surgeon: PMerrie Roof MD;  [place] [facility]  Service: General;  Laterality: Right;  RIGHT MODIFIED RADICAL MASTECTOMY  . Portacath placement  05/10/2012    Procedure: INSERTION PORT-A-CATH;  Surgeon: PMerrie Roof MD;  [place] [place]  Service: General;  Laterality: Left;  . Decompressive lumbar laminectomy level 4 N/A 02/15/2015    Procedure: DECOMPRESSION T5 AND T3-T7 STABALIZATION;  Surgeon: NConsuella Lose MD;  [place];  Service: Neurosurgery;  Laterality: N/A;  . Laminectomy N/A 03/27/2015    Procedure: Thoracic Four-Thoracic Six Laminectomy for tumor;  Surgeon: NConsuella Lose MD;  [facility];  Service: Neurosurgery;  Laterality: N/A;  T4-T6 Laminectomy    There were no vitals filed  for this visit.      Subjective Assessment - 10/29/15 0942    Subjective "I saw Dr. Naaman Plummer yesterday. He was very pleased with my progress. He was wondering if we should try a different type of AFO eventually."   Pertinent History No spinal restrictions.   Limitations House hold activities;Standing;Walking   How long can you walk comfortably? first time 2/13: 30-40 ft with RW   Patient Stated Goals 6/7: "To be able to get down on ground to garden and weed; to walk without my braces - or at least make my braces hinged."   Currently in Pain? No/denies                         [facility] Adult PT Treatment/Exercise - 10/29/15 0001    Ambulation/Gait   Ambulation/Gait Yes   Ambulation/Gait Assistance 5: Supervision   Ambulation/Gait Assistance Details Performed gait trials x175' each with B forearm crutches  using the following: B Blue Rocker AFO's; B [place] Reaction AFO's with R supination control strap; B Reaction AFO's without control strap but with R heel wedge; B PLS (WalkOn) AFO's. R genu recurvatum present with all above AFO types/combinations; however, R genu recurvatum least prominent with Blue Rocker AFO.    Ambulation Distance (Feet) 700 Feet  175' x4 trials; see above   Assistive device R Forearm Crutch;L Forearm Crutch  Bil AFO   Gait Pattern Step-through pattern;Trendelenburg;Decreased dorsiflexion - right;Right genu recurvatum  See details above.   Ambulation Surface Level;Indoor   Exercises   Exercises Other Exercises   Other Exercises  Reviewed B clamshells x15 reps per side with min cueing for technique/alignment. With multimodal cueing from PT, pt performed the following exercises effectively: B prone hamstring curl x20 reps per side (to pt fatigue) with cueing for slow, controlled eccentric control; prone hip ext with knee flexion x5 reps per side with manual assist to maintain knee flexion; supine bridging x10 reps. Per pt report of  tightness in B adductors, added seated B hip adductor stretch (in addition to supine bent knee fall-outs) 2 x45-sec holds. See Pt Instructions for details on exercises added to HEP.                PT Education - 10/29/15 1048    Education provided Yes   Education Details Explained plan for PT to contact orthotist to discuss today's findings, potentially schedule orthotist consult to assess pt progress, if pt appropriate for new/progressed B AFO's.  Reviewed and progressed current HEP. See Pt Instructions.   Person(s) Educated Patient   Methods Explanation;Demonstration;Handout;Verbal cues   Comprehension Verbalized understanding;Returned demonstration          PT Short Term Goals - 10/26/15 1259    PT SHORT TERM GOAL #9   TITLE Pt will improve Berg score to > 45/56 to indicate decreased risk of falling.   (11/11/15)   Time 4   Period  Weeks   Status On-going   PT SHORT TERM GOAL #10   TITLE Pt will transfer from standing to/from tall kneeling on ground using standard chair requiring min A to progress toward pt ability to garden. (11/11/15)   Time 4   Period Weeks   Status On-going   PT SHORT TERM GOAL #11   TITLE Pt will negotiate standard ramp and curb step with mod I using LRAD to indicate safety traversing community obstacles. (11/11/15)   Time 4   Period Weeks   Status On-going  PT SHORT TERM GOAL #12   TITLE Pt will demo B ankle plantar flexion strength of 2+/5 (partial ROM heel raise against gravity) to progress toward pt-stated goal of progressing to hinged AFO's.  (11/11/15)   Baseline 6/19: Deferred, as it would not be appropriate to hinge GRAFO's (and ankle PF strength is 4/5 bilaterally)   Status Deferred           PT Long Term Goals - 10/26/15 1300    PT LONG TERM GOAL #5   Title ambulate > 500' on various indoor/outdoor surfaces modified independent for improved function and mobility with LRAD (10/16/15)   Status On-going   PT LONG TERM GOAL #6   Title improve gait velocity to > 2.5 ft/sec for improved function and mobility (10/16/15)   Baseline 6/7: gait velocity = 2.32 ft/sec   Status On-going   PT LONG TERM GOAL #7   Title BERG:  >/= 38/56     (10/16/15)   Baseline Achieved 6/7 with Berg score of 42/56.   Status Achieved   PT LONG TERM GOAL #8   Title Pt will ambulate >100' over unlevel, grass surfaces with mod I using LRAD to enable pt to safely traverse yard at home. (12/09/15)   Time 8   Period Weeks   Status On-going   PT LONG TERM GOAL  #9   TITLE Pt will transfer from standing to/from tall kneeling on ground using standard chair with mod I to enable pt to safely return to gardening.  (12/09/15)   Time 8   Period Weeks   Status On-going   PT LONG TERM GOAL  #10   TITLE Pt will demo B ankle plantar flexion strength of 3/5 (full ROM heel raise against gravity) to address pt-stated goal of  progressing to hinged AFO's.  (12/09/15)   Baseline 6/19: Deferred, as it would not be appropriate to hinge GRAFO's (and ankle PF strength is 4/5 bilaterally)   Time 8   Period Weeks   Status Deferred               Plan - 10/29/15 1305    Clinical Impression Statement Session focused on trialing carbon fiber off-the-shelf AFO's due to pt progress with gait and BLE strength/motor control. Trialed PLS, Reaction AFO (with and without heel wedge, supination control strap), and Blue Rocker AFO's and pt appeared to have the least prominent R genu recurvatum when using Blue Rocker. Contacted orthotist, Marcello Moores, who plans to attend upcoming PT session to assess/address if pt would be appropriate for new B AFO's. Pt in agreement.    Rehab Potential Good   Clinical Impairments Affecting Rehab Potential excellent progress with mobility since beginning outpatient PT   PT Frequency Other (comment)  2x/week for 4 weeks followed by 1x/week for subsequent 4 weeks   PT Duration Other (comment)  See above.   PT Treatment/Interventions ADLs/Self Care Home Management;Electrical Stimulation;Therapeutic exercise;Therapeutic activities;Functional mobility training;Stair training;Gait training;DME Instruction;Balance training;Neuromuscular re-education;Patient/family education;Orthotic Fit/Training;Canalith Repostioning;Vestibular   PT Next Visit Plan If Marcello Moores present, assess gait and if pt appropriate for new AFO's. Continue LE strengthening with emphasis on gluteus max/medius, hamstrings.   Consulted and Agree with Plan of Care Patient      Patient will benefit from skilled therapeutic intervention in order to improve the following deficits and impairments:  Abnormal gait, Difficulty walking, Pain, Impaired sensation, Decreased strength, Decreased mobility, Decreased balance, Dizziness, Decreased coordination  Visit Diagnosis: Other abnormalities of gait and mobility  Muscle weakness (generalized)  G-Codes - 10/29/15 1309    Functional Assessment Tool Used ambulates > 1,500' with B forearm crutches and mod I using B GRAFO's. Ambulates with min guard without AFO's   Functional Limitation Mobility: Walking and moving around   Mobility: Walking and Moving Around Current Status [phone]) At least 20 percent but less than 40 percent impaired, limited or restricted   Mobility: Walking and Moving Around Goal Status [phone]) At least 1 percent but less than 20 percent impaired, limited or restricted      Problem List Patient Active Problem List   Diagnosis Date Noted  . Anemia of chronic disease 05/21/2015  . Metastatic cancer to spine ([place]) 03/27/2015  . Thrombocytopenia ([place]) 03/12/2015  . Prerenal azotemia 03/12/2015  . Pain   . Recurrent UTI--due to Klebsiella   . Paraplegia at T4 level ([place]) 02/20/2015  . Neurogenic bowel 02/20/2015  . Neurogenic bladder 02/20/2015  . Postoperative anemia due to acute blood loss 02/17/2015  . Spinal cord compression due to malignant neoplasm metastatic to spine ([place]) 02/15/2015  . Epidural mass 02/15/2015  . Thoracic spine tumor   . Pathologic fracture of vertebra 02/14/2015  . Pathologic fracture of thoracic vertebrae 02/14/2015  . Breast cancer metastasized to bone ([place]) 02/14/2015  . Hyperlipidemia 02/14/2015  . H. pylori infection   . HTN (hypertension) 04/17/2012  . Primary cancer of upper outer quadrant of right female breast ([place]) 03/08/2012   Physical Therapy Progress Note  Dates of Reporting Period: 09/11/15 to 10/29/15  Objective Reports of Subjective Statement: See above.  Objective Measurements: See above.  Goal Update: Deferred STG/LTG for adding hinge to B AFO's, as this may be less appropriate for patient than carbon fiber AFO's.  Plan: Continue current POC.  Reason Skilled Services are Required: Patient making excellent functional progress. Continue PT to maximize stability/independence with functional mobility, decrease  fall risk, and progress toward PLOF.     Billie Ruddy, PT, DPT [facility] [address] [place], [place], [address] Phone: [phone]   Fax:  [phone] 10/29/2015, 1:11 PM  Name: Heather [name] MRN: [id number] Date of Birth: 12/01/1945

## 2015-10-29 NOTE — Patient Instructions (Signed)
Knee Extension: Terminal - Standing (Single Leg)   Do this exercise without your braces on. Place your full-length mirror on your RIGHT side to make sure your right knee doesn't go into hyperextension.  Loop your YELLOW resistance band around your right knee. With both hands on your walker, perform a squat, making sure your knees don't move over your toes. Perform 12 reps with husband holding band directly in front of you. Perform 8-10 reps with your husband holding the band behind you.   Perform this exercise 1-2 times per day.   Abduction: Clam (Eccentric) - Side-Lying    Lie on side with knees bent. Lift top knee, keeping feet together. Hold onto edge of bed with top hand to prevent hips from rolling backwards.You should feel this in the muscle on the back of your hip. Slowly lower for 3-5 seconds.  Perform __20__ reps on your LEFT leg and __15__ reps on your RIGHT.  Do this exercise __1-2__ times per day.   Hamstrings    Lie on stomach without a weight on (for now). Bend your knee __about 90__ degrees, pointing toes toward knee. Do not bend hips. Hold __2__ seconds, then slowly lower back to resting position. Repeat _15-20___ times. Do __1-2__ sessions per day on each leg.  Let Benjie Karvonen know when you're able to perform 20 consecutive reps without difficulty.  Bracing With Bridging (Hook-Lying)    With neutral spine, tighten pelvic floor and abdominals and hold. Lift bottom. Repeat _10__ times. Do _2__ times a day. Progress by increasing by 1-2 reps at a time until you're able to perform 20 consecutive reps. (Let Benjie Karvonen know when you're able to do 20).  Hip Adductor - V Stretch   Sit with legs open in a wide V, toes pointing up, hands on knees. Keep spine straight supporting trunk with arms. Slide arms down leg as trunk tips forward. Press knees apart. Hold _30-45__ seconds. Repeat __4__ times per day.

## 2015-10-30 DIAGNOSIS — Z1211 Encounter for screening for malignant neoplasm of colon: Secondary | ICD-10-CM | POA: Diagnosis not present

## 2015-11-03 ENCOUNTER — Telehealth: Payer: Self-pay | Admitting: Physical Therapy

## 2015-11-03 ENCOUNTER — Ambulatory Visit: Payer: Medicare Other | Admitting: Physical Therapy

## 2015-11-03 DIAGNOSIS — R2681 Unsteadiness on feet: Secondary | ICD-10-CM

## 2015-11-03 DIAGNOSIS — R2689 Other abnormalities of gait and mobility: Secondary | ICD-10-CM

## 2015-11-03 DIAGNOSIS — R293 Abnormal posture: Secondary | ICD-10-CM | POA: Diagnosis not present

## 2015-11-03 DIAGNOSIS — M6281 Muscle weakness (generalized): Secondary | ICD-10-CM

## 2015-11-03 NOTE — Telephone Encounter (Signed)
Dr. Naaman Plummer,  I read the note from your office visit with Heather [name] on 6/21. I agree that the patient is ready for B carbon fiber AFO's, as her strength and mobility have improved markedly. Thomas from [facility] was present for our PT session today and agrees.  If you still think it would be appropriate, please place an order for bilateral AFO's.  Thanks so much,  Billie Ruddy, PT, DPT [facility] [address] [place], [place], [address] Phone: [phone]   Fax:  [phone] 11/03/2015, 1:03 PM

## 2015-11-03 NOTE — Therapy (Addendum)
[facility] [address] [place], [place], [address] Phone: [phone]   Fax:  [phone]  Physical Therapy Treatment  Patient Details  Name: Heather [name] MRN: [id number] Date of Birth: 05/14/1945 Referring Provider: Alger Simons, MD  Encounter Date: 11/03/2015      PT End of Session - 11/03/15 1239    Visit Number 41   Number of Visits 3   Date for PT Re-Evaluation 12/13/15   Authorization Type Medicare Traditional primary; BCBS secondary      Authorization Time Period * KX Modified required;    G Codes and progress note required every 10 visits   PT Start Time 0930   PT Stop Time 1014   PT Time Calculation (min) 44 min   Activity Tolerance Patient tolerated treatment well   Behavior During Therapy [facility] for tasks assessed/performed      Past Medical History  Diagnosis Date  . Chronic kidney disease   . Hx: UTI (urinary tract infection)   . Right shoulder pain   . Right knee pain   . Hypertension     recently started Aldactone   . Hyperlipidemia     but not on meds;diet and exercise controlled  . Dizziness     has been going on for 27month;medical MD aware  . H/O hiatal hernia   . GERD (gastroesophageal reflux disease)     Tums prn  . Hemorrhoids   . Urinary frequency     d/t taking Aldactone  . History of UTI   . Anemia     hx of  . Cataract     immature-not sure of which eye  . Anxiety     r/t updated surgery  . Insomnia     takes Melatoniin daily  . [place] cancer [place]     right  . Skin cancer   . Hx of radiation therapy 10/25/12- 12/11/12    right chest wall/regional lymph nodes 5040 cGy, 28 sessions, right chest wall boost 1000 cGy 1 session  . H. pylori infection   . Metastasis to spinal column (HCC)     T5  . Complication of anesthesia     pt states she is sensitive to meds  . PONV (postoperative nausea and vomiting)     pt experienced hair loss, confusion and combative- 02/15/15     Past Surgical History  Procedure Laterality Date  . Cyst removed from left breast  1970  . Left wrist surgery   2004    with plate  . Colonoscopy    . Esophagogastroduodenoscopy    . Mastectomy, radical    . Mastectomy modified radical  05/10/2012    Procedure: MASTECTOMY MODIFIED RADICAL;  Surgeon: PMerrie Roof MD;  Location: [facility]  Service: General;  Laterality: Right;  RIGHT MODIFIED RADICAL MASTECTOMY  . Portacath placement  05/10/2012    Procedure: INSERTION PORT-A-CATH;  Surgeon: PMerrie Roof MD;  Location: [facility]  Service: General;  Laterality: Left;  . Decompressive lumbar laminectomy level 4 N/A 02/15/2015    Procedure: DECOMPRESSION T5 AND T3-T7 STABALIZATION;  Surgeon: NConsuella Lose MD;  [facility];  Service: Neurosurgery;  Laterality: N/A;  . Laminectomy N/A 03/27/2015    Procedure: Thoracic Four-Thoracic Six Laminectomy for tumor;  Surgeon: NConsuella Lose MD;  [facility];  Service: Neurosurgery;  Laterality: N/A;  T4-T6 Laminectomy    There were no vitals filed for this visit.  [facility] Adult PT Treatment/Exercise - 11/03/15 0001    Ambulation/Gait   Ambulation/Gait Yes   Ambulation/Gait Assistance 4: Min guard;4: Min assist   Ambulation Distance (Feet) 575 Feet   Assistive device 1 person hand held assist;L Forearm Crutch   Gait Pattern Step-through pattern;Right genu recurvatum;Decreased weight shift to right;Decreased stance time - right  See details above.   Ambulation Surface Level;Indoor   Gait Comments Orthotic fit/train: With orthotist observing gait pattern, pt ambulated x115' with B GRAFO's without AD with single HHA of PT. Donned B [employer] Matrix (carbon fiber GRAFO's) with heel wedges in B shoes, then ambulated x115' with single HHA with cueing for increased awareness of R knee position in TKE. When using B carbon fiber AFO's, pt demonstrated consistent BLE clearance and L knee  control. Noted intermittent R genu recurvatum (however, pt able to control for 85-95% of gait trial). After seated rest break, performed gait x345' with B carbon fiber AFO's with single L forearm crutch and noted improvement in R knee control. Gait trial ended due to onset of fatigue, R genu recurvatum. Patient, orthotist, and PT discussed TruLife Matrix vs. Cardell Peach. Pt reports increased comfort, better quality of straps on Matrix. This PT prefers Matrix due to increased toe extension, which appears to better control R genu recurvatum.                 PT Education - 11/03/15 1039    Education provided Yes   Education Catering manager and PT discussed with pt the benefits/considerations of a carbon fiber AFO's (as opposed to curent GRAFO's).   Person(s) Educated Patient   Methods Explanation   Comprehension Verbalized understanding          PT Short Term Goals - 10/26/15 1259    PT SHORT TERM GOAL #9   TITLE Pt will improve Berg score to > 45/56 to indicate decreased risk of falling.   (11/11/15)   Time 4   Period Weeks   Status On-going   PT SHORT TERM GOAL #10   TITLE Pt will transfer from standing to/from tall kneeling on ground using standard chair requiring min A to progress toward pt ability to garden. (11/11/15)   Time 4   Period Weeks   Status On-going   PT SHORT TERM GOAL #11   TITLE Pt will negotiate standard ramp and curb step with mod I using LRAD to indicate safety traversing community obstacles. (11/11/15)   Time 4   Period Weeks   Status On-going   PT SHORT TERM GOAL #12   TITLE Pt will demo B ankle plantar flexion strength of 2+/5 (partial ROM heel raise against gravity) to progress toward pt-stated goal of progressing to hinged AFO's.  (11/11/15)   Baseline 6/19: Deferred, as it would not be appropriate to hinge GRAFO's (and ankle PF strength is 4/5 bilaterally)   Status Deferred           PT Long Term Goals - 10/26/15 1300    PT LONG TERM GOAL #5    Title ambulate > 500' on various indoor/outdoor surfaces modified independent for improved function and mobility with LRAD (10/16/15)   Status On-going   PT LONG TERM GOAL #6   Title improve gait velocity to > 2.5 ft/sec for improved function and mobility (10/16/15)   Baseline 6/7: gait velocity = 2.32 ft/sec   Status On-going   PT LONG TERM GOAL #7   Title BERG:  >/= 38/56     (10/16/15)  Baseline Achieved 6/7 with Berg score of 42/56.   Status Achieved   PT LONG TERM GOAL #8   Title Pt will ambulate >100' over unlevel, grass surfaces with mod I using LRAD to enable pt to safely traverse yard at home. (12/09/15)   Time 8   Period Weeks   Status On-going   PT LONG TERM GOAL  #9   TITLE Pt will transfer from standing to/from tall kneeling on ground using standard chair with mod I to enable pt to safely return to gardening.  (12/09/15)   Time 8   Period Weeks   Status On-going   PT LONG TERM GOAL  #10   TITLE Pt will demo B ankle plantar flexion strength of 3/5 (full ROM heel raise against gravity) to address pt-stated goal of progressing to hinged AFO's.  (12/09/15)   Baseline 6/19: Deferred, as it would not be appropriate to hinge GRAFO's (and ankle PF strength is 4/5 bilaterally)   Time 8   Period Weeks   Status Deferred               Plan - 11/03/15 1239    Clinical Impression Statement Orthotist present for current session to assess/address patient appropriateness for new B carbon fiber AFO's due to significant functional improvements. As compared with B [place] AFO's trialed at previous PT session, noted improved control of R genu recurvatum using [employer] AFO's bilaterally. Patient, orthotist, and PT in full agreement that TruLife AFO's are best option for bracing at this time.    Clinical Impairments Affecting Rehab Potential excellent progress with mobility since beginning outpatient PT   PT Frequency Other (comment)  2x/week for 4 weeks followed by 1x/week for 4 weeks   PT  Duration Other (comment)  See above.   PT Treatment/Interventions ADLs/Self Care Home Management;Electrical Stimulation;Therapeutic exercise;Therapeutic activities;Functional mobility training;Stair training;Gait training;DME Instruction;Balance training;Neuromuscular re-education;Patient/family education;Orthotic Fit/Training;Canalith Repostioning;Vestibular   PT Next Visit Plan Add core strengthening to HEP.   Consulted and Agree with Plan of Care Patient      Patient will benefit from skilled therapeutic intervention in order to improve the following deficits and impairments:  Abnormal gait, Difficulty walking, Pain, Impaired sensation, Decreased strength, Decreased mobility, Decreased balance, Dizziness, Decreased coordination  Visit Diagnosis: Other abnormalities of gait and mobility  Muscle weakness (generalized)  Unsteadiness on feet     Problem List Patient Active Problem List   Diagnosis Date Noted  . Anemia of chronic disease 05/21/2015  . Metastatic cancer to spine ([place]) 03/27/2015  . Thrombocytopenia ([place]) 03/12/2015  . Prerenal azotemia 03/12/2015  . Pain   . Recurrent UTI--due to Klebsiella   . Paraplegia at T4 level ([place]) 02/20/2015  . Neurogenic bowel 02/20/2015  . Neurogenic bladder 02/20/2015  . Postoperative anemia due to acute blood loss 02/17/2015  . Spinal cord compression due to malignant neoplasm metastatic to spine ([place]) 02/15/2015  . Epidural mass 02/15/2015  . Thoracic spine tumor   . Pathologic fracture of vertebra 02/14/2015  . Pathologic fracture of thoracic vertebrae 02/14/2015  . Breast cancer metastasized to bone ([place]) 02/14/2015  . Hyperlipidemia 02/14/2015  . H. pylori infection   . HTN (hypertension) 04/17/2012  . Primary cancer of upper outer quadrant of right female breast ([place]) 03/08/2012    Billie Ruddy, PT, [facility] [address] [place], [place], [address] Phone: [phone]    Fax:  [phone] 11/03/2015, 12:57 PM  Name: Heather [name] MRN: [id number] Date of Birth:  03/27/1946     

## 2015-11-04 ENCOUNTER — Other Ambulatory Visit: Payer: Self-pay | Admitting: *Deleted

## 2015-11-04 DIAGNOSIS — C50411 Malignant neoplasm of upper-outer quadrant of right female breast: Secondary | ICD-10-CM

## 2015-11-04 NOTE — Telephone Encounter (Signed)
Sounds good. Will do!! We'll fax over.   Thanks!

## 2015-11-05 ENCOUNTER — Ambulatory Visit: Payer: Medicare Other

## 2015-11-05 ENCOUNTER — Ambulatory Visit (HOSPITAL_BASED_OUTPATIENT_CLINIC_OR_DEPARTMENT_OTHER): Payer: Medicare Other | Admitting: Hematology and Oncology

## 2015-11-05 ENCOUNTER — Ambulatory Visit (HOSPITAL_BASED_OUTPATIENT_CLINIC_OR_DEPARTMENT_OTHER): Payer: Medicare Other

## 2015-11-05 ENCOUNTER — Other Ambulatory Visit (HOSPITAL_BASED_OUTPATIENT_CLINIC_OR_DEPARTMENT_OTHER): Payer: Medicare Other

## 2015-11-05 ENCOUNTER — Telehealth: Payer: Self-pay | Admitting: Hematology and Oncology

## 2015-11-05 ENCOUNTER — Encounter: Payer: Self-pay | Admitting: Hematology and Oncology

## 2015-11-05 VITALS — BP 155/80 | HR 72 | Temp 98.1°F | Resp 18

## 2015-11-05 VITALS — BP 153/74 | HR 73 | Temp 98.1°F | Resp 18 | Wt 134.0 lb

## 2015-11-05 DIAGNOSIS — Z5112 Encounter for antineoplastic immunotherapy: Secondary | ICD-10-CM

## 2015-11-05 DIAGNOSIS — D638 Anemia in other chronic diseases classified elsewhere: Secondary | ICD-10-CM

## 2015-11-05 DIAGNOSIS — Z170 Estrogen receptor positive status [ER+]: Secondary | ICD-10-CM

## 2015-11-05 DIAGNOSIS — C7951 Secondary malignant neoplasm of bone: Secondary | ICD-10-CM | POA: Diagnosis not present

## 2015-11-05 DIAGNOSIS — C50411 Malignant neoplasm of upper-outer quadrant of right female breast: Secondary | ICD-10-CM

## 2015-11-05 DIAGNOSIS — C50911 Malignant neoplasm of unspecified site of right female breast: Secondary | ICD-10-CM

## 2015-11-05 LAB — COMPREHENSIVE METABOLIC PANEL
ALT: 19 U/L (ref 0–55)
AST: 22 U/L (ref 5–34)
Albumin: 3.7 g/dL (ref 3.5–5.0)
Alkaline Phosphatase: 77 U/L (ref 40–150)
Anion Gap: 10 mEq/L (ref 3–11)
BUN: 13.2 mg/dL (ref 7.0–26.0)
CO2: 26 mEq/L (ref 22–29)
Calcium: 9.7 mg/dL (ref 8.4–10.4)
Chloride: 101 mEq/L (ref 98–109)
Creatinine: 0.8 mg/dL (ref 0.6–1.1)
EGFR: 77 mL/min/{1.73_m2} — ABNORMAL LOW (ref >90)
Glucose: 100 mg/dl (ref 70–140)
Potassium: 3.9 mEq/L (ref 3.5–5.1)
Sodium: 137 mEq/L (ref 136–145)
Total Bilirubin: 0.41 mg/dL (ref 0.20–1.20)
Total Protein: 7.1 g/dL (ref 6.4–8.3)

## 2015-11-05 LAB — CBC WITH DIFFERENTIAL/PLATELET
BASO%: 0.7 % (ref 0.0–2.0)
Basophils Absolute: 0 10*3/uL (ref 0.0–0.1)
EOS%: 1.6 % (ref 0.0–7.0)
Eosinophils Absolute: 0.1 10*3/uL (ref 0.0–0.5)
HCT: 37.7 % (ref 34.8–46.6)
HGB: 12.3 g/dL (ref 11.6–15.9)
LYMPH%: 13.5 % — ABNORMAL LOW (ref 14.0–49.7)
MCH: 26.8 pg (ref 25.1–34.0)
MCHC: 32.7 g/dL (ref 31.5–36.0)
MCV: 82.1 fL (ref 79.5–101.0)
MONO#: 0.4 10*3/uL (ref 0.1–0.9)
MONO%: 8.1 % (ref 0.0–14.0)
NEUT#: 3.5 10*3/uL (ref 1.5–6.5)
NEUT%: 76.1 % (ref 38.4–76.8)
Platelets: 196 10*3/uL (ref 145–400)
RBC: 4.59 10*6/uL (ref 3.70–5.45)
RDW: 14.6 % — ABNORMAL HIGH (ref 11.2–14.5)
WBC: 4.6 10*3/uL (ref 3.9–10.3)
lymph#: 0.6 10*3/uL — ABNORMAL LOW (ref 0.9–3.3)

## 2015-11-05 MED ORDER — ACETAMINOPHEN 325 MG PO TABS
650.0000 mg | ORAL_TABLET | Freq: Once | ORAL | Status: AC
  Administered 2015-11-05: 650 mg via ORAL

## 2015-11-05 MED ORDER — SODIUM CHLORIDE 0.9 % IJ SOLN
10.0000 mL | INTRAMUSCULAR | Status: DC | PRN
  Administered 2015-11-05: 10 mL
  Filled 2015-11-05: qty 10

## 2015-11-05 MED ORDER — HEPARIN SOD (PORK) LOCK FLUSH 100 UNIT/ML IV SOLN
500.0000 [IU] | Freq: Once | INTRAVENOUS | Status: AC | PRN
  Administered 2015-11-05: 500 [IU]
  Filled 2015-11-05: qty 5

## 2015-11-05 MED ORDER — ACETAMINOPHEN 325 MG PO TABS
ORAL_TABLET | ORAL | Status: AC
  Filled 2015-11-05: qty 2

## 2015-11-05 MED ORDER — DIPHENHYDRAMINE HCL 25 MG PO CAPS
ORAL_CAPSULE | ORAL | Status: AC
  Filled 2015-11-05: qty 1

## 2015-11-05 MED ORDER — SODIUM CHLORIDE 0.9 % IV SOLN
6.0000 mg/kg | Freq: Once | INTRAVENOUS | Status: AC
  Administered 2015-11-05: 399 mg via INTRAVENOUS
  Filled 2015-11-05: qty 19

## 2015-11-05 MED ORDER — SODIUM CHLORIDE 0.9 % IJ SOLN
10.0000 mL | Freq: Once | INTRAMUSCULAR | Status: AC
  Administered 2015-11-05: 10 mL
  Filled 2015-11-05: qty 10

## 2015-11-05 MED ORDER — SODIUM CHLORIDE 0.9 % IV SOLN
Freq: Once | INTRAVENOUS | Status: AC
  Administered 2015-11-05: 11:00:00 via INTRAVENOUS

## 2015-11-05 MED ORDER — DIPHENHYDRAMINE HCL 25 MG PO CAPS
25.0000 mg | ORAL_CAPSULE | Freq: Once | ORAL | Status: AC
Start: 2015-11-05 — End: 2015-11-05
  Administered 2015-11-05: 25 mg via ORAL

## 2015-11-05 NOTE — Patient Instructions (Signed)
[facility] Discharge Instructions for Patients Receiving Chemotherapy  Today you received the following chemotherapy agents:  Herceptin  To help prevent nausea and vomiting after your treatment, we encourage you to take your nausea medication as prescribed.   If you develop nausea and vomiting that is not controlled by your nausea medication, call the clinic.   BELOW ARE SYMPTOMS THAT SHOULD BE REPORTED IMMEDIATELY:  *FEVER GREATER THAN 100.5 F  *CHILLS WITH OR WITHOUT FEVER  NAUSEA AND VOMITING THAT IS NOT CONTROLLED WITH YOUR NAUSEA MEDICATION  *UNUSUAL SHORTNESS OF BREATH  *UNUSUAL BRUISING OR BLEEDING  TENDERNESS IN MOUTH AND THROAT WITH OR WITHOUT PRESENCE OF ULCERS  *URINARY PROBLEMS  *BOWEL PROBLEMS  UNUSUAL RASH Items with * indicate a potential emergency and should be followed up as soon as possible.  Feel free to call the clinic you have any questions or concerns. The clinic phone number is [phone].  Please show the CHEMO ALERT CARD at check-in to the Emergency Department and triage nurse.   

## 2015-11-05 NOTE — Telephone Encounter (Signed)
appt made per 6/29 pof

## 2015-11-05 NOTE — Assessment & Plan Note (Signed)
Right breast invasive ductal carcinoma ER positive PR negative HER-2 positive Ki-67 44% multifocal disease 3/7 lymph nodes positive T2 N1 M0 stage IIB status post adjuvant chemotherapy with TCH followed by Herceptin maintenance, adjuvant radiation therapy, currently on letrozole since 02/07/2013. MRI 02/14/2015: T5 large destructive lesion with pathologic compression fracture and extensive epidural tumor, involvement of T4, excision metastatic carcinoma ER 60%, PR 0%, HER-2 positive ratio 2.71 average copy #7.45 03/27/2015: T4-6 decompression surgery for spinal cord compression Radiation therapy to the spine (Dr. [name]) 04/13/2015 to 05/19/2015  Treatment plan: 1. Resumed anastrozole 05/21/2015, but it has caused significant hair loss so we switched her to exemestane 25 mg once daily 07/23/2015 2. Herceptin every 3 week started 04/10/2015 3. Obtain echocardiograms every 3 months 4. Bone metastases: Zometa every 3 months. (Change made because of diffuse bone pain related to Zometa infusions) -------------------------------------------------------------------------------------------------------------------------- Goal of treatment: Palliation Echocardiogram 10/07/2015: EF 55-60% (stable from 07/2015 but overall down from 65-70%)   Zometa toxicities: bone pain and fatigue. If it causes persistent symptoms, we might switch her Zometa every 3 months. She receives Zometa today.  Hypoproteinemia: patient trying to increase her dietary protein And her albumin level significant increase of 3.4  Anemia of chronic disease:Hemoglobin is slowly improving. Today's hemoglobin is 12.1   CT chest abdomen pelvis 09/01/2015: Pathologic fracture with posterior fusion at T5 no other evidence of metastatic disease in the chest abdomen pelvis  Return to clinic every 3 weeks for Herceptin infusions and every 9 weeks for clinic follow-up. We plan to obtain another CT scan prior to the next visit.

## 2015-11-05 NOTE — Progress Notes (Signed)
Patient Care Team: [name], MD as PCP - General (Family Medicine)  DIAGNOSIS: Primary cancer of upper outer quadrant of right female breast [facility])   Staging form: Breast, AJCC 7th Edition     Clinical: Stage IIB (T2, N1, cM0) - Unsigned       Staging comments: Staged at breast conference 11.6.13      Pathologic: No stage assigned - Unsigned   SUMMARY OF ONCOLOGIC HISTORY:   Primary cancer of upper outer quadrant of right female breast ([place])   03/08/2012 Initial Diagnosis Right breast invasive ductal carcinoma ER positive PR negative HER-2 positive Ki-67 44%; another breast mass biopsied in the anterior part of the breast which was also positive for malignancy that was HER-2 negative   05/10/2012 Surgery Right mastectomy and axillary lymph node dissection: Multifocal disease 5 cm, 1.7 cm, 1.6 cm, ER positive PR negative HER-2 positive Ki-67 44%, 3/7 lymph nodes positive   06/14/2012 - 06/12/2013 Chemotherapy Adjuvant chemotherapy with [place] 6 followed by Herceptin maintenance   10/25/2012 - 12/11/2012 Radiation Therapy Adjuvant radiation therapy   02/07/2013 -  Anti-estrogen oral therapy Letrozole 2.5 mg daily   02/14/2015 Imaging MRI spine: Large destructive T5 lesion with severe pathologic compression fracture and extensive epidural tumor, severe spinal stenosis with moderate cord compression, tumor involvement of T4   03/18/2015 PET scan Residual enhancing soft tissue adjacent to the spinal cord. No evidence of metastatic disease. Nonspecific uptake the left nipple   03/27/2015 - 03/30/2015 Hospital Admission T4-6 decompression for spinal cord compression (lower extremity paralysis)   04/10/2015 -  Chemotherapy Palliative treatment with Herceptin every 3 weeks with letrozole 2.5 mg daily   04/13/2015 - 05/19/2015 Radiation Therapy Palliative radiation treatment to the spine   09/01/2015 Imaging CT chest abdomen pelvis: Pathologic fracture with posterior fusion at T5 no other evidence of metastatic  disease in the chest abdomen pelvis    CHIEF COMPLIANT: Patient is able to walk with crutches  INTERVAL HISTORY: Heather [name] is a 70 year old with above-mentioned history of metastatic breast cancer with a focal lesion in the thoracic spine that cause cord compression. She is now able to walk with the help of crutches. This is a huge improvement from where she could not even move her legs. She is struggling inspiration for many. She reports that she is tolerating the Herceptin extremely well. She does feel somewhat depressed coming to the [facility] when she sees other patients were in a much worse position than her. But otherwise she is in good spirits.  REVIEW OF SYSTEMS:   Constitutional: Denies fevers, chills or abnormal weight loss Eyes: Denies blurriness of vision Ears, nose, mouth, throat, and face: Denies mucositis or sore throat Respiratory: Denies cough, dyspnea or wheezes Cardiovascular: Denies palpitation, chest discomfort Gastrointestinal:  Denies nausea, heartburn or change in bowel habits Skin: Denies abnormal skin rashes Lymphatics: Denies new lymphadenopathy or easy bruising Neurological: Able to walk with crutches Behavioral/Psych: Mood is stable, no new changes  Extremities: No lower extremity edema Breast:  denies any pain or lumps or nodules in either breasts All other systems were reviewed with the patient and are negative.  I have reviewed the past medical history, past surgical history, social history and family history with the patient and they are unchanged from previous note.  ALLERGIES:  is allergic to codeine; keflex; naprosyn; oxycodone; and tussin.  MEDICATIONS:  Current Outpatient Prescriptions  Medication Sig Dispense Refill  . aspirin 81 MG tablet Take 81 mg by mouth  daily.    . b complex vitamins tablet Take 1 tablet by mouth daily.    . Calcium Carbonate Antacid (TUMS PO) Take 2 tablets by mouth 2 (two) times daily as needed (acid reflux).    .  Cholecalciferol (VITAMIN D) 2000 UNITS tablet Take 2,000 Units by mouth daily.    . Coenzyme Q10 (COQ10) 100 MG CAPS Take 100 mg by mouth at bedtime.    Marland Kitchen exemestane (AROMASIN) 25 MG tablet Take 1 tablet (25 mg total) by mouth daily after breakfast. 90 tablet 3  . gabapentin (NEURONTIN) 600 MG tablet Take 1 tablet (600 mg total) by mouth 3 (three) times daily. 270 tablet 2  . letrozole (FEMARA) 2.5 MG tablet Take 1 tablet (2.5 mg total) by mouth daily. 90 tablet 1  . lidocaine-prilocaine (EMLA) cream Apply to affected area once (Patient taking differently: Apply 1 application topically every 21 ( twenty-one) days. Apply to port prior to infusions) 30 g 3  . methocarbamol (ROBAXIN) 500 MG tablet Take 1 tablet (500 mg total) by mouth daily as needed for muscle spasms. 75 tablet 0  . OVER THE COUNTER MEDICATION Place 1 drop into both eyes 2 (two) times daily as needed (dry eyes). Over the counter lubricating eye drops    . polyethylene glycol (MIRALAX / GLYCOLAX) packet Take 17 g by mouth 2 (two) times daily. (Patient taking differently: Take 8.5 g by mouth daily as needed (constipation). ) 100 each 0  . Probiotic Product (PROBIOTIC DAILY PO) Take 1 capsule by mouth 2 (two) times daily.     . prochlorperazine (COMPAZINE) 10 MG tablet Take 1 tablet (10 mg total) by mouth every 6 (six) hours as needed for nausea or vomiting. 30 tablet 0  . ranitidine (ZANTAC) 300 MG tablet Take 300 mg by mouth at bedtime. Reported on 06/25/2015    . spironolactone (ALDACTONE) 25 MG tablet Take 0.5 tablets (12.5 mg total) by mouth daily. 45 tablet 12  . traMADol (ULTRAM) 50 MG tablet Take 1 tablet (50 mg total) by mouth every 12 (twelve) hours as needed for moderate pain. 30 tablet 0  . Wound Cleansers (RADIAPLEX EX) Apply topically. Reported on 06/25/2015     No current facility-administered medications for this visit.    PHYSICAL EXAMINATION: ECOG PERFORMANCE STATUS: 1 - Symptomatic but completely ambulatory  Filed  Vitals:   11/05/15 1005  BP: 153/74  Pulse: 73  Temp: 98.1 F (36.7 C)  Resp: 18   Filed Weights   11/05/15 1005  Weight: 134 lb (60.782 kg)    GENERAL:alert, no distress and comfortable SKIN: skin color, texture, turgor are normal, no rashes or significant lesions EYES: normal, Conjunctiva are pink and non-injected, sclera clear OROPHARYNX:no exudate, no erythema and lips, buccal mucosa, and tongue normal  NECK: supple, thyroid normal size, non-tender, without nodularity LYMPH:  no palpable lymphadenopathy in the cervical, axillary or inguinal LUNGS: clear to auscultation and percussion with normal breathing effort HEART: regular rate & rhythm and no murmurs and no lower extremity edema ABDOMEN:abdomen soft, non-tender and normal bowel sounds MUSCULOSKELETAL:no cyanosis of digits and no clubbing  NEURO: alert & oriented x 3 with fluent speech, no focal motor/sensory deficits EXTREMITIES: No lower extremity edema  LABORATORY DATA:  I have reviewed the data as listed   Chemistry      Component Value Date/Time   NA 137 11/05/2015 0908   NA 135 03/28/2015 0446   K 3.9 11/05/2015 0908   K 3.8 03/28/2015 0446  CL 99* 03/28/2015 0446   CL 101 10/25/2012 0946   CO2 26 11/05/2015 0908   CO2 28 03/28/2015 0446   BUN 13.2 11/05/2015 0908   BUN 24* 03/28/2015 0446   CREATININE 0.8 11/05/2015 0908   CREATININE 0.78 03/28/2015 0446      Component Value Date/Time   CALCIUM 9.7 11/05/2015 0908   CALCIUM 7.8* 03/28/2015 0446   ALKPHOS 77 11/05/2015 0908   ALKPHOS 124 03/27/2015 1434   AST 22 11/05/2015 0908   AST 42* 03/27/2015 1434   ALT 19 11/05/2015 0908   ALT 136* 03/27/2015 1434   BILITOT 0.41 11/05/2015 0908   BILITOT 0.9 03/27/2015 1434       Lab Results  Component Value Date   WBC 4.6 11/05/2015   HGB 12.3 11/05/2015   HCT 37.7 11/05/2015   MCV 82.1 11/05/2015   PLT 196 11/05/2015   NEUTROABS 3.5 11/05/2015     ASSESSMENT & PLAN:  Primary cancer of  upper outer quadrant of right female breast ([place]) Right breast invasive ductal carcinoma ER positive PR negative HER-2 positive Ki-67 44% multifocal disease 3/7 lymph nodes positive T2 N1 M0 stage IIB status post adjuvant chemotherapy with TCH followed by Herceptin maintenance, adjuvant radiation therapy, currently on letrozole since 02/07/2013. MRI 02/14/2015: T5 large destructive lesion with pathologic compression fracture and extensive epidural tumor, involvement of T4, excision metastatic carcinoma ER 60%, PR 0%, HER-2 positive ratio 2.71 average copy #7.45 03/27/2015: T4-6 decompression surgery for spinal cord compression Radiation therapy to the spine (Dr. [name]) 04/13/2015 to 05/19/2015  Treatment plan: 1. Resumed anastrozole 05/21/2015, but it has caused significant hair loss so we switched her to exemestane 25 mg once daily 07/23/2015 2. Herceptin every 3 week started 04/10/2015 3. Obtain echocardiograms every 3 months 4. Bone metastases: Zometa every 3 months. (Change made because of diffuse bone pain related to Zometa infusions) -------------------------------------------------------------------------------------------------------------------------- Goal of treatment: Palliation Echocardiogram 10/07/2015: EF 55-60% (stable from 07/2015 but overall down from 65-70%)   Zometa toxicities: bone pain and fatigue. If it causes persistent symptoms,  Zometa every 3 months.  Hypoproteinemia: patient trying to increase her dietary protein And her albumin level significant increase of 3.4 Patient is now able to walk with the help of crutches. This is a huge improvement from where she started.  Anemia of chronic disease:Hemoglobin is slowly improving. Today's hemoglobin is 12.3  CT chest abdomen pelvis 09/01/2015: Pathologic fracture with posterior fusion at T5 no other evidence of metastatic disease in the chest abdomen pelvis  Return to clinic every 3 weeks for Herceptin infusions and every  9 weeks for clinic follow-up. We plan to obtain another CT scan prior to the next visit.   Orders Placed This Encounter  Procedures  . CT Abdomen Pelvis W Contrast    Standing Status: Future     Number of Occurrences:      Standing Expiration Date: 11/04/2016    Order Specific Question:  If indicated for the ordered procedure, I authorize the administration of contrast media per Radiology protocol    Answer:  Yes    Order Specific Question:  Reason for Exam (SYMPTOM  OR DIAGNOSIS REQUIRED)    Answer:  Metastatic breast cancer restaging    Order Specific Question:  Preferred imaging location?    Answer:  [facility]  . CT Chest W Contrast    Standing Status: Future     Number of Occurrences:      Standing Expiration Date: 11/04/2016  Order Specific Question:  If indicated for the ordered procedure, I authorize the administration of contrast media per Radiology protocol    Answer:  Yes    Order Specific Question:  Reason for Exam (SYMPTOM  OR DIAGNOSIS REQUIRED)    Answer:  Metastatic breast cancer restaging    Order Specific Question:  Preferred imaging location?    Answer:  [facility]   The patient has a good understanding of the overall plan. she agrees with it. she will call with any problems that may develop before the next visit here.   Rulon Eisenmenger, MD 11/05/2015

## 2015-11-06 ENCOUNTER — Ambulatory Visit: Payer: Medicare Other | Admitting: Physical Therapy

## 2015-11-06 DIAGNOSIS — M6281 Muscle weakness (generalized): Secondary | ICD-10-CM | POA: Diagnosis not present

## 2015-11-06 DIAGNOSIS — R2681 Unsteadiness on feet: Secondary | ICD-10-CM | POA: Diagnosis not present

## 2015-11-06 DIAGNOSIS — R2689 Other abnormalities of gait and mobility: Secondary | ICD-10-CM

## 2015-11-06 DIAGNOSIS — R293 Abnormal posture: Secondary | ICD-10-CM | POA: Diagnosis not present

## 2015-11-06 NOTE — Therapy (Addendum)
[facility] [address] [place], [place], [address] Phone: [phone]   Fax:  [phone]  Physical Therapy Treatment  Patient Details  Name: Heather [name] MRN: [id number] Date of Birth: Jan 17, 1946 Referring Provider: Alger Simons, MD  Encounter Date: 11/06/2015      PT End of Session - 11/06/15 1219    Visit Number 42   Number of Visits 74   Date for PT Re-Evaluation 12/13/15   Authorization Type Medicare Traditional primary; BCBS secondary      Authorization Time Period * KX Modified required;    G Codes and progress note required every 10 visits   PT Start Time 1100   PT Stop Time 1146   PT Time Calculation (min) 46 min   Equipment Utilized During Treatment Gait belt   Activity Tolerance Patient tolerated treatment well   Behavior During Therapy [facility] for tasks assessed/performed      Past Medical History  Diagnosis Date  . Chronic kidney disease   . Hx: UTI (urinary tract infection)   . Right shoulder pain   . Right knee pain   . Hypertension     recently started Aldactone   . Hyperlipidemia     but not on meds;diet and exercise controlled  . Dizziness     has been going on for 64month;medical MD aware  . H/O hiatal hernia   . GERD (gastroesophageal reflux disease)     Tums prn  . Hemorrhoids   . Urinary frequency     d/t taking Aldactone  . History of UTI   . Anemia     hx of  . Cataract     immature-not sure of which eye  . Anxiety     r/t updated surgery  . Insomnia     takes Melatoniin daily  . [place]     [place]  . Skin cancer   . Hx of radiation therapy 10/25/12- 12/11/12    right chest wall/regional lymph nodes 5040 cGy, 28 sessions, right chest wall boost 1000 cGy 1 session  . H. pylori infection   . Metastasis to spinal column (HCC)     T5  . Complication of anesthesia     pt states she is sensitive to meds  . PONV (postoperative nausea and vomiting)     pt  experienced hair loss, confusion and combative- 02/15/15    Past Surgical History  Procedure Laterality Date  . Cyst removed from left breast  1970  . Left wrist surgery   2004    with plate  . Colonoscopy    . Esophagogastroduodenoscopy    . Mastectomy, radical    . Mastectomy modified radical  05/10/2012    Procedure: MASTECTOMY MODIFIED RADICAL;  Surgeon: PMerrie Roof MD;  [facility]  Service: General;  Laterality: Right;  RIGHT MODIFIED RADICAL MASTECTOMY  . Portacath placement  05/10/2012    Procedure: INSERTION PORT-A-CATH;  Surgeon: PMerrie Roof MD;  [facility]  Service: General;  Laterality: Left;  . Decompressive lumbar laminectomy level 4 N/A 02/15/2015    Procedure: DECOMPRESSION T5 AND T3-T7 STABALIZATION;  Surgeon: NConsuella Lose MD;  [facility];  Service: Neurosurgery;  Laterality: N/A;  . Laminectomy N/A 03/27/2015    Procedure: Thoracic Four-Thoracic Six Laminectomy for tumor;  Surgeon: NConsuella Lose MD;  [facility];  Service: Neurosurgery;  Laterality: N/A;  T4-T6 Laminectomy    There were no vitals filed  for this visit.      Subjective Assessment - 11/06/15 1106    Subjective Pt inquiring as to whether this PT has requested order for braces yet, how much progress we've made on getting AFO's order.   Pertinent History No spinal restrictions.   Limitations House hold activities;Standing;Walking   How long can you walk comfortably? first time 2/13: 30-40 ft with RW   Patient Stated Goals 6/7: "To be able to get down on ground to garden and weed; to walk without my braces - or at least make my braces hinged."   Currently in Pain? No/denies                         [facility] Adult PT Treatment/Exercise - 11/06/15 0001    Ambulation/Gait   Ambulation/Gait Yes   Ambulation/Gait Assistance 4: Min assist   Ambulation/Gait Assistance Details Gait x230' without AD, with B GRAFO's with min A, verbal/tactile cueing  for decreased posterior preference, increased lateral weight shift to R side; tactile/resistive cueing for increased R pelvic protraction, increased anterior weight shift during LLE advancement.   Ambulation Distance (Feet) 230 Feet   Assistive device None;Other (Comment)  B custom GRAFO's   Gait Pattern Step-through pattern;Decreased weight shift to right;Decreased stance time - right   Ambulation Surface Level;Indoor   Exercises   Exercises Other Exercises   Other Exercises  Performed quadruped alternating LE extension x12 reps (to pt fatigue) with cueing for Transverse Abdominus (TA) and lumbar multifidi activation with effective return demo ; prone alternating reciprocal UE/LE elevation for multifidus strengthening x4 reps prior to stopping due to increased discomfort in thoracic/lumbar spine. Transitioned to supine, hook lying TA activation with concurrent alternating reciprocal LE flexion (8" from mat) x20 reps Seated on blue physioball, pt performed alternating LE elevation x15 reps without UE use, progressing to more narrow BOS x10 reps; finally, progressed to alternating reciprocal UE/LE elevation x10 reps with cueing for core muscular activation.                PT Education - 11/06/15 1210    Education provided Yes   Education Details Added core strengthening to HEP; see Pt Instructions for details.   Person(s) Educated Patient   Methods Explanation;Demonstration;Handout;Verbal cues;Tactile cues   Comprehension Verbalized understanding;Returned demonstration          PT Short Term Goals - 10/26/15 1259    PT SHORT TERM GOAL #9   TITLE Pt will improve Berg score to > 45/56 to indicate decreased risk of falling.   (11/11/15)   Time 4   Period Weeks   Status On-going   PT SHORT TERM GOAL #10   TITLE Pt will transfer from standing to/from tall kneeling on ground using standard chair requiring min A to progress toward pt ability to garden. (11/11/15)   Time 4   Period Weeks    Status On-going   PT SHORT TERM GOAL #11   TITLE Pt will negotiate standard ramp and curb step with mod I using LRAD to indicate safety traversing community obstacles. (11/11/15)   Time 4   Period Weeks   Status On-going   PT SHORT TERM GOAL #12   TITLE Pt will demo B ankle plantar flexion strength of 2+/5 (partial ROM heel raise against gravity) to progress toward pt-stated goal of progressing to hinged AFO's.  (11/11/15)   Baseline 6/19: Deferred, as it would not be appropriate to hinge GRAFO's (and ankle PF strength is  4/5 bilaterally)   Status Deferred           PT Long Term Goals - 10/26/15 1300    PT LONG TERM GOAL #5   Title ambulate > 500' on various indoor/outdoor surfaces modified independent for improved function and mobility with LRAD (10/16/15)   Status On-going   PT LONG TERM GOAL #6   Title improve gait velocity to > 2.5 ft/sec for improved function and mobility (10/16/15)   Baseline 6/7: gait velocity = 2.32 ft/sec   Status On-going   PT LONG TERM GOAL #7   Title BERG:  >/= 38/56     (10/16/15)   Baseline Achieved 6/7 with Berg score of 42/56.   Status Achieved   PT LONG TERM GOAL #8   Title Pt will ambulate >100' over unlevel, grass surfaces with mod I using LRAD to enable pt to safely traverse yard at home. (12/09/15)   Time 8   Period Weeks   Status On-going   PT LONG TERM GOAL  #9   TITLE Pt will transfer from standing to/from tall kneeling on ground using standard chair with mod I to enable pt to safely return to gardening.  (12/09/15)   Time 8   Period Weeks   Status On-going   PT LONG TERM GOAL  #10   TITLE Pt will demo B ankle plantar flexion strength of 3/5 (full ROM heel raise against gravity) to address pt-stated goal of progressing to hinged AFO's.  (12/09/15)   Baseline 6/19: Deferred, as it would not be appropriate to hinge GRAFO's (and ankle PF strength is 4/5 bilaterally)   Time 8   Period Weeks   Status Deferred               Plan - 11/06/15  1223    Clinical Impression Statement Session focused on adding core muscular strengthening to HEP as well as gait training without AD (using B custom GRAFO's). Pt tolerated interventions well. Would benefit from further gait training without AD.   Clinical Impairments Affecting Rehab Potential excellent progress with mobility since beginning outpatient PT   PT Frequency Other (comment)  2x/week for 4 weeks followed by 1x/week for 4 weeks   PT Duration Other (comment)  See above.   PT Treatment/Interventions ADLs/Self Care Home Management;Electrical Stimulation;Therapeutic exercise;Therapeutic activities;Functional mobility training;Stair training;Gait training;DME Instruction;Balance training;Neuromuscular re-education;Patient/family education;Orthotic Fit/Training;Canalith Repostioning;Vestibular   PT Next Visit Plan Gait training without AD. Without AFO's, try standing SLS (step taps, turning over cones, etc)   Consulted and Agree with Plan of Care Patient      Patient will benefit from skilled therapeutic intervention in order to improve the following deficits and impairments:  Abnormal gait, Difficulty walking, Pain, Impaired sensation, Decreased strength, Decreased mobility, Decreased balance, Dizziness, Decreased coordination  Visit Diagnosis: Other abnormalities of gait and mobility  Muscle weakness (generalized)  Unsteadiness on feet     Problem List Patient Active Problem List   Diagnosis Date Noted  . Anemia of chronic disease 05/21/2015  . Metastatic cancer to spine ([place]) 03/27/2015  . Thrombocytopenia ([place]) 03/12/2015  . Prerenal azotemia 03/12/2015  . Pain   . Recurrent UTI--due to Klebsiella   . Paraplegia at T4 level ([place]) 02/20/2015  . Neurogenic bowel 02/20/2015  . Neurogenic bladder 02/20/2015  . Postoperative anemia due to acute blood loss 02/17/2015  . Spinal cord compression due to malignant neoplasm metastatic to spine ([place]) 02/15/2015  . Epidural mass  02/15/2015  . Thoracic spine tumor   .  Pathologic fracture of vertebra 02/14/2015  . Pathologic fracture of thoracic vertebrae 02/14/2015  . Breast cancer metastasized to bone ([place]) 02/14/2015  . Hyperlipidemia 02/14/2015  . H. pylori infection   . HTN (hypertension) 04/17/2012  . Primary cancer of upper outer quadrant of right female breast ([place]) 03/08/2012    Billie Ruddy, PT, DPT [facility] [address] [place], [place], [address] Phone: [phone]   Fax:  [phone] 11/06/2015, 1:37 PM  Name: Heather [name] MRN: [id number] Date of Birth: 1945-10-30

## 2015-11-06 NOTE — Patient Instructions (Signed)
Knee Extension: Terminal - Standing (Single Leg)   Do this exercise without your braces on. Place your full-length mirror on your RIGHT side to make sure your right knee doesn't go into hyperextension.  Loop your YELLOW resistance band around your right knee. With both hands on your walker, perform a squat, making sure your knees don't move over your toes. Perform 12 reps with husband holding band directly in front of you. Perform 8-10 reps with your husband holding the band behind you.   Perform this exercise 1-2 times per day.   Abduction: Clam (Eccentric) - Side-Lying    Lie on side with knees bent. Lift top knee, keeping feet together. Hold onto edge of bed with top hand to prevent hips from rolling backwards.You should feel this in the muscle on the back of your hip. Slowly lower for 3-5 seconds.  Perform __20__ reps on your LEFT leg and __15__ reps on your RIGHT.  Do this exercise __1-2__ times per day.   Hamstrings    Lie on stomach without a weight on (for now). Bend your knee __about 90__ degrees, pointing toes toward knee. Do not bend hips. Hold __2__ seconds, then slowly lower back to resting position. Repeat _15-20___ times. Do __1-2__ sessions per day on each leg.  Let Benjie Karvonen know when you're able to perform 20 consecutive reps without difficulty.  Bracing With Bridging (Hook-Lying)    With neutral spine, tighten pelvic floor and abdominals and hold. Lift bottom. Repeat _10__ times. Do _2__ times a day. Progress by increasing by 1-2 reps at a time until you're able to perform 20 consecutive reps. (Let Benjie Karvonen know when you're able to do 20).  Hip Adductor - V Stretch   Sit with legs open in a wide V, toes pointing up, hands on knees. Keep spine straight supporting trunk with arms. Slide arms down leg as trunk tips forward. Press knees apart. Hold _30-45__ seconds. Repeat __4__ times per day.   Bracing With Leg Raise (Quadruped)    On hands and knees find  neutral spine. Tighten pelvic floor and abdominals and hold. Alternating legs, straighten and lift to hip level. Repeat _12__ times. Do __2-3_ times a day. Increase by 1-2 reps at a time, as tolerated. Let Benjie Karvonen know when you're able to perform 20 consecutive reps easily (so she can make it more difficult).  Bracing With Leg March (Hook-Lying)    With neutral spine, activate TA muscle (as we practiced in therapy). Engage this muscle while performing alternating leg lifts. Lift foot _6-8_ inches and return to floor. Repeat __20_ times. Do _2-3__ times a day.  Copyright  VHI. All rights reserved.

## 2015-11-09 ENCOUNTER — Ambulatory Visit: Payer: Medicare Other | Admitting: Physical Therapy

## 2015-11-11 ENCOUNTER — Telehealth: Payer: Self-pay | Admitting: Hematology and Oncology

## 2015-11-11 NOTE — Telephone Encounter (Signed)
Patient called re wanting to get additional tx appointments on schedule for the AM. Patient currently scheduled for next tx 7/20 and f/u for 8/31. Per last pof 6/29 tx is to be q3w with 9 week follow up. Added additional tx dates for 8/10 and and 8/31 after 9 week follow up. Patient aware and will get new schedule 7/20.

## 2015-11-12 ENCOUNTER — Ambulatory Visit: Payer: Medicare Other | Attending: Physical Medicine & Rehabilitation | Admitting: Physical Therapy

## 2015-11-12 DIAGNOSIS — R2689 Other abnormalities of gait and mobility: Secondary | ICD-10-CM | POA: Diagnosis not present

## 2015-11-12 DIAGNOSIS — R2681 Unsteadiness on feet: Secondary | ICD-10-CM | POA: Diagnosis not present

## 2015-11-12 DIAGNOSIS — R293 Abnormal posture: Secondary | ICD-10-CM | POA: Insufficient documentation

## 2015-11-12 DIAGNOSIS — M6281 Muscle weakness (generalized): Secondary | ICD-10-CM | POA: Insufficient documentation

## 2015-11-12 DIAGNOSIS — H8111 Benign paroxysmal vertigo, right ear: Secondary | ICD-10-CM | POA: Insufficient documentation

## 2015-11-12 NOTE — Patient Instructions (Addendum)
Do these exercises on Monday, Wednesday, and Friday:  Knee Extension: Terminal - Standing (Single Leg)   Do this exercise without your braces on. Place your full-length mirror on your RIGHT side to make sure your right knee doesn't go into hyperextension.  Loop your YELLOW resistance band around your right knee. With both hands on your walker, perform a squat, making sure your knees don't move over your toes. Perform 12 reps with husband holding band directly in front of you. Perform 8-10 reps with your husband holding the band behind you.   Perform this exercise 1-2 times per day.   Abduction: Clam (Eccentric) - Side-Lying    Lie on side with knees bent. Lift top knee, keeping feet together. Hold onto edge of bed with top hand to prevent hips from rolling backwards.You should feel this in the muscle on the back of your hip. Slowly lower for 3-5 seconds.  Perform __20__ reps on your LEFT leg and __15__ reps on your RIGHT.  Do this exercise __1-2__ times per day.   Hamstrings    Lie on stomach without a weight on (for now). Bend your knee __about 90__ degrees, pointing toes toward knee. Do not bend hips. Hold __2__ seconds, then slowly lower back to resting position. Repeat _15-20___ times. Do __1-2__ sessions per day on each leg.  Let Benjie Karvonen know when you're able to perform 20 consecutive reps without difficulty.  Bracing With Bridging (Hook-Lying)    Hip Adductor - V Stretch   Sit with legs open in a wide V, toes pointing up, hands on knees. Keep spine straight supporting trunk with arms. Slide arms down leg as trunk tips forward. Press knees apart. Hold _30-45__ seconds. Repeat __4__ times per day.   Do these exercises on Tuesday, Thursday, and Saturday:  Bracing With Leg Raise (Quadruped)    On hands and knees find neutral spine. Tighten pelvic floor and abdominals and hold. Alternating legs, straighten and lift to hip level. Repeat _12__ times. Do __2-3_ times a  day. Increase by 1-2 reps at a time, as tolerated. Let Benjie Karvonen know when you're able to perform 20 consecutive reps easily (so she can make it more difficult).   Marching    Sit on your physioball. Engage core musculature as practiced in therapy. March in place for __10__ consecutive reps. Do _2-3__ times per day.  Copyright  VHI. All rights reserved.     Balance: Eyes Closed - Bilateral (Varied Surfaces)    Stand with your back to a corner with a stable chair in front of you. Stand with feet shoulder width, close eyes.Don't stand on a pillow (yet). Be sure your knees remain soft ("unlocked").  Maintain balance _20___ seconds. Repeat __4__ times per set. Do __2__ sets per day.   Feet Apart, Head Motion - Eyes Open    Stand with your back to a corner with a stable chair in front of you. With eyes open, feet apart, move head slowly: up and down 10 times; right to left 10 times. Repeat __2__ times per day.

## 2015-11-12 NOTE — Therapy (Addendum)
[facility] [address] [place], [place], [address] Phone: [phone]   Fax:  [phone]  Physical Therapy Treatment  Patient Details  Name: Heather [name] MRN: [id number] Date of Birth: 1946-03-16 Referring Provider: Alger Simons, MD  Encounter Date: 11/12/2015      PT End of Session - 11/12/15 1228    Visit Number 43   Number of Visits 42   Date for PT Re-Evaluation 12/13/15   Authorization Type Medicare Traditional primary; BCBS secondary      Authorization Time Period * KX Modified required;    G Codes and progress note required every 10 visits   PT Start Time 1014   PT Stop Time 1100   PT Time Calculation (min) 46 min   Equipment Utilized During Treatment Gait belt   Activity Tolerance Patient tolerated treatment well   Behavior During Therapy [facility] for tasks assessed/performed      Past Medical History  Diagnosis Date  . Chronic kidney disease   . Hx: UTI (urinary tract infection)   . Right shoulder pain   . Right knee pain   . Hypertension     recently started Aldactone   . Hyperlipidemia     but not on meds;diet and exercise controlled  . Dizziness     has been going on for 19month;medical MD aware  . H/O hiatal hernia   . GERD (gastroesophageal reflux disease)     Tums prn  . Hemorrhoids   . Urinary frequency     d/t taking Aldactone  . History of UTI   . Anemia     hx of  . Cataract     immature-not sure of which eye  . Anxiety     r/t updated surgery  . Insomnia     takes Melatoniin daily  . Breast cancer ([place]     right  . Skin cancer   . Hx of radiation therapy 10/25/12- 12/11/12    right chest wall/regional lymph nodes 5040 cGy, 28 sessions, right chest wall boost 1000 cGy 1 session  . H. pylori infection   . Metastasis to spinal column (HCC)     T5  . Complication of anesthesia     pt states she is sensitive to meds  . PONV (postoperative nausea and vomiting)     pt  experienced hair loss, confusion and combative- 02/15/15    Past Surgical History  Procedure Laterality Date  . Cyst removed from left breast  1970  . Left wrist surgery   2004    with plate  . Colonoscopy    . Esophagogastroduodenoscopy    . Mastectomy, radical    . Mastectomy modified radical  05/10/2012    Procedure: MASTECTOMY MODIFIED RADICAL;  Surgeon: PMerrie Roof MD;  [place] [facility]  Service: General;  Laterality: Right;  RIGHT MODIFIED RADICAL MASTECTOMY  . Portacath placement  05/10/2012    Procedure: INSERTION PORT-A-CATH;  Surgeon: PMerrie Roof MD;  [place] [facility]  Service: General;  Laterality: Left;  . Decompressive lumbar laminectomy level 4 N/A 02/15/2015    Procedure: DECOMPRESSION T5 AND T3-T7 STABALIZATION;  Surgeon: NConsuella Lose MD;  [facility];  Service: Neurosurgery;  Laterality: N/A;  . Laminectomy N/A 03/27/2015    Procedure: Thoracic Four-Thoracic Six Laminectomy for tumor;  Surgeon: NConsuella Lose MD;  [facility];  Service: Neurosurgery;  Laterality: N/A;  T4-T6 Laminectomy    There were no vitals filed  for this visit.      Subjective Assessment - 11/12/15 1017    Subjective Pt reports no significant changes, no falls. Pt reports, "not feeling myself this week; not as energetic."    Pertinent History No spinal restrictions.   Limitations House hold activities;Standing;Walking   How long can you walk comfortably? --   Patient Stated Goals 6/7: "To be able to get down on ground to garden and weed; to walk without my braces - or at least make my braces hinged."   Currently in Pain? No/denies                         [facility] Adult PT Treatment/Exercise - 11/12/15 0001    Ambulation/Gait   Ambulation/Gait Yes   Ambulation/Gait Assistance 4: Min guard;4: Min assist   Ambulation/Gait Assistance Details Gait x220' without AD using B custom GRAFO's, x220' without AD without GRAFO's with min guard-min A.  Cueing emphasized grading lateral weight shift (especially to L side) and on controlling R terminal knee extension.   Ambulation Distance (Feet) 460 Feet   Assistive device None;Other (Comment)  B GRAFO's   Gait Pattern Step-through pattern;Decreased weight shift to right;Decreased stance time - right;Left foot flat   Ambulation Surface Level;Indoor   Neuro Re-ed    Neuro Re-ed Details  Pt performed the following with min guard to min A, manual approximation at R knee for increased proprioceptive input/sensory awareness, no UE support: LLE step tap on 2" target x20 reps, multidirectional taps onto objects of varying size/shape x25 reps; progressed to tapping top of small cones. Cueing focused on grading lateral weight shift, R knee stability/control.              Balance Exercises - 11/12/15 1226    Balance Exercises: Standing   Standing Eyes Opened Wide (BOA);Head turns;Solid surface;Other reps (comment)  horizontal, vertical head turns x10 each   Standing Eyes Closed Wide (BOA);Solid surface;20 secs;2 reps           PT Education - 11/12/15 1217    Education provided Yes   Education Details HEP: added corner balance exercises. Modified HEP to decrease # of exercises per day.   Person(s) Educated Patient   Methods Explanation;Demonstration;Handout   Comprehension Returned demonstration;Verbalized understanding          PT Short Term Goals - 10/26/15 1259    PT SHORT TERM GOAL #9   TITLE Pt will improve Berg score to > 45/56 to indicate decreased risk of falling.   (11/11/15)   Time 4   Period Weeks   Status On-going   PT SHORT TERM GOAL #10   TITLE Pt will transfer from standing to/from tall kneeling on ground using standard chair requiring min A to progress toward pt ability to garden. (11/11/15)   Time 4   Period Weeks   Status On-going   PT SHORT TERM GOAL #11   TITLE Pt will negotiate standard ramp and curb step with mod I using LRAD to indicate safety traversing  community obstacles. (11/11/15)   Time 4   Period Weeks   Status On-going   PT SHORT TERM GOAL #12   TITLE Pt will demo B ankle plantar flexion strength of 2+/5 (partial ROM heel raise against gravity) to progress toward pt-stated goal of progressing to hinged AFO's.  (11/11/15)   Baseline 6/19: Deferred, as it would not be appropriate to hinge GRAFO's (and ankle PF strength is 4/5 bilaterally)   Status  Deferred           PT Long Term Goals - 10/26/15 1300    PT LONG TERM GOAL #5   Title ambulate > 500' on various indoor/outdoor surfaces modified independent for improved function and mobility with LRAD (10/16/15)   Status On-going   PT LONG TERM GOAL #6   Title improve gait velocity to > 2.5 ft/sec for improved function and mobility (10/16/15)   Baseline 6/7: gait velocity = 2.32 ft/sec   Status On-going   PT LONG TERM GOAL #7   Title BERG:  >/= 38/56     (10/16/15)   Baseline Achieved 6/7 with Berg score of 42/56.   Status Achieved   PT LONG TERM GOAL #8   Title Pt will ambulate >100' over unlevel, grass surfaces with mod I using LRAD to enable pt to safely traverse yard at home. (12/09/15)   Time 8   Period Weeks   Status On-going   PT LONG TERM GOAL  #9   TITLE Pt will transfer from standing to/from tall kneeling on ground using standard chair with mod I to enable pt to safely return to gardening.  (12/09/15)   Time 8   Period Weeks   Status On-going   PT LONG TERM GOAL  #10   TITLE Pt will demo B ankle plantar flexion strength of 3/5 (full ROM heel raise against gravity) to address pt-stated goal of progressing to hinged AFO's.  (12/09/15)   Baseline 6/19: Deferred, as it would not be appropriate to hinge GRAFO's (and ankle PF strength is 4/5 bilaterally)   Time 8   Period Weeks   Status Deferred               Plan - 11/12/15 1228    Clinical Impression Statement Session focused on incorporating corner balance exercises (emphasis on vestibular reliance) into HEP. Also  performed gait training without forearm crutches or custom GRAFO's; independence limited by decreased postural control/stability (especially with head turns) and R genu recurvatum.    Rehab Potential Good   Clinical Impairments Affecting Rehab Potential excellent progress with mobility since beginning outpatient PT   PT Frequency Other (comment)  2x/week for 4 weeks followed by 1x/week for 4 weeks   PT Duration Other (comment)  See above.   PT Treatment/Interventions ADLs/Self Care Home Management;Electrical Stimulation;Therapeutic exercise;Therapeutic activities;Functional mobility training;Stair training;Gait training;DME Instruction;Balance training;Neuromuscular re-education;Patient/family education;Orthotic Fit/Training;Canalith Repostioning;Vestibular   PT Next Visit Plan * Check STG's.   Consulted and Agree with Plan of Care Patient      Patient will benefit from skilled therapeutic intervention in order to improve the following deficits and impairments:  Abnormal gait, Difficulty walking, Pain, Impaired sensation, Decreased strength, Decreased mobility, Decreased balance, Dizziness, Decreased coordination  Visit Diagnosis: Other abnormalities of gait and mobility  Unsteadiness on feet  Muscle weakness (generalized)     Problem List Patient Active Problem List   Diagnosis Date Noted  . Anemia of chronic disease 05/21/2015  . Metastatic cancer to spine ([place]) 03/27/2015  . Thrombocytopenia ([place]) 03/12/2015  . Prerenal azotemia 03/12/2015  . Pain   . Recurrent UTI--due to Klebsiella   . Paraplegia at T4 level ([place]) 02/20/2015  . Neurogenic bowel 02/20/2015  . Neurogenic bladder 02/20/2015  . Postoperative anemia due to acute blood loss 02/17/2015  . Spinal cord compression due to malignant neoplasm metastatic to spine ([place]) 02/15/2015  . Epidural mass 02/15/2015  . Thoracic spine tumor   . Pathologic fracture of vertebra 02/14/2015  .  Pathologic fracture of thoracic  vertebrae 02/14/2015  . Breast cancer metastasized to bone ([place]) 02/14/2015  . Hyperlipidemia 02/14/2015  . H. pylori infection   . HTN (hypertension) 04/17/2012  . Primary cancer of upper outer quadrant of right female breast ([place]) 03/08/2012    Billie Ruddy, PT, DPT [facility] [address] [place], [place], [address] Phone: [phone]   Fax:  [phone] 11/12/2015, 12:33 PM  Name: UNNAMED HINO MRN: [id number] Date of Birth: 10/28/1945

## 2015-11-19 ENCOUNTER — Ambulatory Visit: Payer: Medicare Other | Admitting: Physical Therapy

## 2015-11-19 DIAGNOSIS — R2681 Unsteadiness on feet: Secondary | ICD-10-CM

## 2015-11-19 DIAGNOSIS — R2689 Other abnormalities of gait and mobility: Secondary | ICD-10-CM | POA: Diagnosis not present

## 2015-11-19 DIAGNOSIS — M810 Age-related osteoporosis without current pathological fracture: Secondary | ICD-10-CM | POA: Diagnosis not present

## 2015-11-19 DIAGNOSIS — R293 Abnormal posture: Secondary | ICD-10-CM

## 2015-11-19 DIAGNOSIS — H8111 Benign paroxysmal vertigo, right ear: Secondary | ICD-10-CM | POA: Diagnosis not present

## 2015-11-19 DIAGNOSIS — M6281 Muscle weakness (generalized): Secondary | ICD-10-CM

## 2015-11-19 NOTE — Patient Instructions (Signed)
PT Short Term Goals - 10/26/15 1259    PT SHORT TERM GOAL #9   TITLE Pt will improve Berg score to > 45/56 to indicate decreased risk of falling. (11/11/15)   Time 4   Period Weeks   Status On-going   PT SHORT TERM GOAL #10   TITLE Pt will transfer from standing to/from tall kneeling on ground using standard chair requiring min A to progress toward pt ability to garden. (11/11/15)   Time 4   Period Weeks   Status On-going   PT SHORT TERM GOAL #11   TITLE Pt will negotiate standard ramp and curb step with mod I using LRAD to indicate safety traversing community obstacles. (11/11/15)   Time 4   Period Weeks   Status On-going   PT SHORT TERM GOAL #12

## 2015-11-19 NOTE — Therapy (Signed)
[facility] [address] [place], [place], [address] Phone: [phone]   Fax:  [phone]  Physical Therapy Treatment  Patient Details  Name: Heather [name] MRN: [id number] Date of Birth: 03/25/1946 Referring Provider: Alger Simons, MD  Encounter Date: 11/19/2015      PT End of Session - 11/19/15 1306    Visit Number 34   Number of Visits 29   Date for PT Re-Evaluation 12/13/15   Authorization Type Medicare Traditional primary; BCBS secondary      Authorization Time Period * KX Modified required;    G Codes and progress note required every 10 visits   PT Start Time 0934   PT Stop Time 1017   PT Time Calculation (min) 43 min   Equipment Utilized During Treatment Gait belt   Activity Tolerance Patient tolerated treatment well   Behavior During Therapy [facility] for tasks assessed/performed      Past Medical History  Diagnosis Date  . Chronic kidney disease   . Hx: UTI (urinary tract infection)   . Right shoulder pain   . Right knee pain   . Hypertension     recently started Aldactone   . Hyperlipidemia     but not on meds;diet and exercise controlled  . Dizziness     has been going on for 19month;medical MD aware  . H/O hiatal hernia   . GERD (gastroesophageal reflux disease)     Tums prn  . Hemorrhoids   . Urinary frequency     d/t taking Aldactone  . History of UTI   . Anemia     hx of  . Cataract     immature-not sure of which eye  . Anxiety     r/t updated surgery  . Insomnia     takes Melatoniin daily  . Breast cancer ([facility]     right  . Skin cancer   . Hx of radiation therapy 10/25/12- 12/11/12    right chest wall/regional lymph nodes 5040 cGy, 28 sessions, right chest wall boost 1000 cGy 1 session  . H. pylori infection   . Metastasis to spinal column (HCC)     T5  . Complication of anesthesia     pt states she is sensitive to meds  . PONV (postoperative nausea and vomiting)     pt  experienced hair loss, confusion and combative- 02/15/15    Past Surgical History  Procedure Laterality Date  . Cyst removed from left breast  1970  . Left wrist surgery   2004    with plate  . Colonoscopy    . Esophagogastroduodenoscopy    . Mastectomy, radical    . Mastectomy modified radical  05/10/2012    Procedure: MASTECTOMY MODIFIED RADICAL;  Surgeon: PMerrie Roof MD;  [place] [facility]  Service: General;  Laterality: Right;  RIGHT MODIFIED RADICAL MASTECTOMY  . Portacath placement  05/10/2012    Procedure: INSERTION PORT-A-CATH;  Surgeon: PMerrie Roof MD;  [place] [facility]  Service: General;  Laterality: Left;  . Decompressive lumbar laminectomy level 4 N/A 02/15/2015    Procedure: DECOMPRESSION T5 AND T3-T7 STABALIZATION;  Surgeon: NConsuella Lose MD;  [facility];  Service: Neurosurgery;  Laterality: N/A;  . Laminectomy N/A 03/27/2015    Procedure: Thoracic Four-Thoracic Six Laminectomy for tumor;  Surgeon: NConsuella Lose MD;  [facility];  Service: Neurosurgery;  Laterality: N/A;  T4-T6 Laminectomy    There were no vitals filed  for this visit.      Subjective Assessment - 11/19/15 0937    Subjective Reports the Left knee buckles intermittently when walking without the braces at home.  Reports having spells when standing for example during cooking prep and will have to lie down.  Thinks that it may be an inner ear issue.   Pertinent History No spinal restrictions.   Limitations House hold activities;Standing;Walking   Patient Stated Goals 6/7: "To be able to get down on ground to garden and weed; to walk without my braces - or at least make my braces hinged."                         [facility] Adult PT Treatment/Exercise - 11/19/15 0001    Ambulation/Gait   Ambulation/Gait Yes   Ambulation/Gait Assistance 4: Min guard   Ambulation/Gait Assistance Details Practised with AD but with GRAFOs  no buckling on knees noted or LOB  athough pt reached out for   Ambulation Distance (Feet) 400 Feet   Assistive device L Axillary Crutch;Other (Comment)  Bilateral GRAFOs   Gait Pattern Step-through pattern;Decreased weight shift to right;Decreased stance time - right;Left foot flat   Ambulation Surface Level   Ramp 6: Modified independent (Device)   Ramp Details (indicate cue type and reason) step through with GRAFO and forearm crutches   Curb 5: Supervision  Min cues for sequencing with crutches   Curb Details (indicate cue type and reason) no LOB aith forearm crutches   Standardized Balance Assessment   Standardized Balance Assessment Berg Balance Test   Berg Balance Test   Sit to Stand Able to stand without using hands and stabilize independently   Standing Unsupported Able to stand safely 2 minutes   Sitting with Back Unsupported but Feet Supported on Floor or Stool Able to sit safely and securely 2 minutes   Stand to Sit Sits safely with minimal use of hands   Transfers Able to transfer safely, minor use of hands   Standing Unsupported with Eyes Closed Able to stand 10 seconds safely   Standing Ubsupported with Feet Together Able to place feet together independently and stand 1 minute safely   From Standing, Reach Forward with Outstretched Arm Can reach confidently >25 cm (10")   From Standing Position, Pick up Object from Floor Able to pick up shoe safely and easily   From Standing Position, Turn to Look Behind Over each Shoulder Looks behind from both sides and weight shifts well   Turn 360 Degrees Able to turn 360 degrees safely but slowly   Standing Unsupported, Alternately Place Feet on Step/Stool Able to stand independently and complete 8 steps >20 seconds   Standing Unsupported, One Foot in Front Able to plae foot ahead of the other independently and hold 30 seconds   Standing on One Leg Unable to try or needs assist to prevent fall   Total Score 48                PT Education - 11/19/15 1305     Education provided Yes   Education Details goals checked and discussed questions about updated HEP.   Person(s) Educated Patient   Methods Explanation   Comprehension Verbalized understanding          PT Short Term Goals - 11/19/15 1307    PT SHORT TERM GOAL #9   TITLE Pt will improve Berg score to > 45/56 to indicate decreased risk of falling.   (  11/11/15)   Baseline 48/56, 11/19/15.   Time 4   Period Weeks   Status Achieved   PT SHORT TERM GOAL #10   TITLE Pt will transfer from standing to/from tall kneeling on ground using standard chair requiring min A to progress toward pt ability to garden. (11/11/15)   Baseline met 11/19/15   Time 4   Period Weeks   Status Achieved   PT SHORT TERM GOAL #11   TITLE Pt will negotiate standard ramp and curb step with mod I using LRAD to indicate safety traversing community obstacles. (11/11/15)   Baseline ModI ramp with forearm crutches; supervsion curb with forearm crutches; both with GRAFO donned; 11/19/15.   Time 4   Period Weeks   Status Partially Met   PT SHORT TERM GOAL #12   TITLE Pt will demo B ankle plantar flexion strength of 2+/5 (partial ROM heel raise against gravity) to progress toward pt-stated goal of progressing to hinged AFO's.  (11/11/15)   Baseline 6/19: Deferred, as it would not be appropriate to hinge GRAFO's (and ankle PF strength is 4/5 bilaterally)   Status Deferred           PT Long Term Goals - 10/26/15 1300    PT LONG TERM GOAL #5   Title ambulate > 500' on various indoor/outdoor surfaces modified independent for improved function and mobility with LRAD (10/16/15)   Status On-going   PT LONG TERM GOAL #6   Title improve gait velocity to > 2.5 ft/sec for improved function and mobility (10/16/15)   Baseline 6/7: gait velocity = 2.32 ft/sec   Status On-going   PT LONG TERM GOAL #7   Title BERG:  >/= 38/56     (10/16/15)   Baseline Achieved 6/7 with Berg score of 42/56.   Status Achieved   PT LONG TERM GOAL #8   Title Pt  will ambulate >100' over unlevel, grass surfaces with mod I using LRAD to enable pt to safely traverse yard at home. (12/09/15)   Time 8   Period Weeks   Status On-going   PT LONG TERM GOAL  #9   TITLE Pt will transfer from standing to/from tall kneeling on ground using standard chair with mod I to enable pt to safely return to gardening.  (12/09/15)   Time 8   Period Weeks   Status On-going   PT LONG TERM GOAL  #10   TITLE Pt will demo B ankle plantar flexion strength of 3/5 (full ROM heel raise against gravity) to address pt-stated goal of progressing to hinged AFO's.  (12/09/15)   Baseline 6/19: Deferred, as it would not be appropriate to hinge GRAFO's (and ankle PF strength is 4/5 bilaterally)   Time 8   Period Weeks   Status Deferred               Plan - 11/19/15 0940    Clinical Impression Statement Pt is making great progress with balance scoring 48/56 and demonstrated no knee bucling with gait with no AD but with GRAFO donned.   Rehab Potential Good   Clinical Impairments Affecting Rehab Potential excellent progress with mobility since beginning outpatient PT   PT Frequency Other (comment)  2x/week for 4 weeks followed by 1x/week for 4 weeks   PT Duration Other (comment)  See above.   PT Treatment/Interventions ADLs/Self Care Home Management;Electrical Stimulation;Therapeutic exercise;Therapeutic activities;Functional mobility training;Stair training;Gait training;DME Instruction;Balance training;Neuromuscular re-education;Patient/family education;Orthotic Fit/Training;Canalith Repostioning;Vestibular   PT Next Visit Plan assess inner ear  issue.   Consulted and Agree with Plan of Care Patient      Patient will benefit from skilled therapeutic intervention in order to improve the following deficits and impairments:  Abnormal gait, Difficulty walking, Pain, Impaired sensation, Decreased strength, Decreased mobility, Decreased balance, Dizziness, Decreased coordination  Visit  Diagnosis: Other abnormalities of gait and mobility  Unsteadiness on feet  Muscle weakness (generalized)  Abnormal posture     Problem List Patient Active Problem List   Diagnosis Date Noted  . Anemia of chronic disease 05/21/2015  . Metastatic cancer to spine ([place]) 03/27/2015  . Thrombocytopenia ([place]) 03/12/2015  . Prerenal azotemia 03/12/2015  . Pain   . Recurrent UTI--due to Klebsiella   . Paraplegia at T4 level ([place]) 02/20/2015  . Neurogenic bowel 02/20/2015  . Neurogenic bladder 02/20/2015  . Postoperative anemia due to acute blood loss 02/17/2015  . Spinal cord compression due to malignant neoplasm metastatic to spine ([place]) 02/15/2015  . Epidural mass 02/15/2015  . Thoracic spine tumor   . Pathologic fracture of vertebra 02/14/2015  . Pathologic fracture of thoracic vertebrae 02/14/2015  . Breast cancer metastasized to bone ([place]) 02/14/2015  . Hyperlipidemia 02/14/2015  . H. pylori infection   . HTN (hypertension) 04/17/2012  . Primary cancer of upper outer quadrant of right female breast ([place]) 03/08/2012    Bjorn Loser, PTA  11/19/2015, 1:12 PM [facility] [address] [place], [place], [address] Phone: [phone]   Fax:  [phone]  Name: Heather [name] MRN: [id number] Date of Birth: 03-05-1946

## 2015-11-26 ENCOUNTER — Ambulatory Visit: Payer: Medicare Other | Admitting: Physical Therapy

## 2015-11-26 ENCOUNTER — Ambulatory Visit: Payer: Medicare Other

## 2015-11-26 ENCOUNTER — Other Ambulatory Visit (HOSPITAL_BASED_OUTPATIENT_CLINIC_OR_DEPARTMENT_OTHER): Payer: Medicare Other

## 2015-11-26 ENCOUNTER — Ambulatory Visit (HOSPITAL_BASED_OUTPATIENT_CLINIC_OR_DEPARTMENT_OTHER): Payer: Medicare Other

## 2015-11-26 ENCOUNTER — Other Ambulatory Visit: Payer: Self-pay | Admitting: Hematology and Oncology

## 2015-11-26 VITALS — BP 155/87 | HR 69 | Temp 97.9°F | Resp 18

## 2015-11-26 DIAGNOSIS — C50411 Malignant neoplasm of upper-outer quadrant of right female breast: Secondary | ICD-10-CM

## 2015-11-26 DIAGNOSIS — Z5112 Encounter for antineoplastic immunotherapy: Secondary | ICD-10-CM | POA: Diagnosis not present

## 2015-11-26 DIAGNOSIS — C7951 Secondary malignant neoplasm of bone: Secondary | ICD-10-CM

## 2015-11-26 DIAGNOSIS — C50911 Malignant neoplasm of unspecified site of right female breast: Secondary | ICD-10-CM

## 2015-11-26 LAB — CBC WITH DIFFERENTIAL/PLATELET
BASO%: 1 % (ref 0.0–2.0)
Basophils Absolute: 0 10*3/uL (ref 0.0–0.1)
EOS%: 1.6 % (ref 0.0–7.0)
Eosinophils Absolute: 0.1 10*3/uL (ref 0.0–0.5)
HCT: 39.3 % (ref 34.8–46.6)
HGB: 12.8 g/dL (ref 11.6–15.9)
LYMPH%: 16.4 % (ref 14.0–49.7)
MCH: 26.7 pg (ref 25.1–34.0)
MCHC: 32.5 g/dL (ref 31.5–36.0)
MCV: 81.9 fL (ref 79.5–101.0)
MONO#: 0.4 10*3/uL (ref 0.1–0.9)
MONO%: 10.3 % (ref 0.0–14.0)
NEUT#: 3 10*3/uL (ref 1.5–6.5)
NEUT%: 70.7 % (ref 38.4–76.8)
Platelets: 197 10*3/uL (ref 145–400)
RBC: 4.79 10*6/uL (ref 3.70–5.45)
RDW: 14.6 % — ABNORMAL HIGH (ref 11.2–14.5)
WBC: 4.2 10*3/uL (ref 3.9–10.3)
lymph#: 0.7 10*3/uL — ABNORMAL LOW (ref 0.9–3.3)

## 2015-11-26 LAB — COMPREHENSIVE METABOLIC PANEL
ALT: 21 U/L (ref 0–55)
AST: 23 U/L (ref 5–34)
Albumin: 3.9 g/dL (ref 3.5–5.0)
Alkaline Phosphatase: 80 U/L (ref 40–150)
Anion Gap: 12 mEq/L — ABNORMAL HIGH (ref 3–11)
BUN: 12.5 mg/dL (ref 7.0–26.0)
CO2: 25 mEq/L (ref 22–29)
Calcium: 9.9 mg/dL (ref 8.4–10.4)
Chloride: 99 mEq/L (ref 98–109)
Creatinine: 0.8 mg/dL (ref 0.6–1.1)
EGFR: 75 mL/min/{1.73_m2} — ABNORMAL LOW (ref >90)
Glucose: 100 mg/dl (ref 70–140)
Potassium: 4 mEq/L (ref 3.5–5.1)
Sodium: 135 mEq/L — ABNORMAL LOW (ref 136–145)
Total Bilirubin: 0.63 mg/dL (ref 0.20–1.20)
Total Protein: 7.5 g/dL (ref 6.4–8.3)

## 2015-11-26 MED ORDER — DIPHENHYDRAMINE HCL 25 MG PO CAPS
ORAL_CAPSULE | ORAL | Status: AC
  Filled 2015-11-26: qty 1

## 2015-11-26 MED ORDER — ACETAMINOPHEN 325 MG PO TABS
ORAL_TABLET | ORAL | Status: AC
  Filled 2015-11-26: qty 2

## 2015-11-26 MED ORDER — HEPARIN SOD (PORK) LOCK FLUSH 100 UNIT/ML IV SOLN
500.0000 [IU] | Freq: Once | INTRAVENOUS | Status: AC | PRN
  Administered 2015-11-26: 500 [IU]
  Filled 2015-11-26: qty 5

## 2015-11-26 MED ORDER — SODIUM CHLORIDE 0.9 % IV SOLN
Freq: Once | INTRAVENOUS | Status: AC
  Administered 2015-11-26: 11:00:00 via INTRAVENOUS

## 2015-11-26 MED ORDER — ACETAMINOPHEN 325 MG PO TABS
650.0000 mg | ORAL_TABLET | Freq: Once | ORAL | Status: AC
  Administered 2015-11-26: 650 mg via ORAL

## 2015-11-26 MED ORDER — SODIUM CHLORIDE 0.9 % IV SOLN
6.0000 mg/kg | Freq: Once | INTRAVENOUS | Status: AC
  Administered 2015-11-26: 399 mg via INTRAVENOUS
  Filled 2015-11-26: qty 19

## 2015-11-26 MED ORDER — ZOLEDRONIC ACID 4 MG/100ML IV SOLN
4.0000 mg | Freq: Once | INTRAVENOUS | Status: AC
  Administered 2015-11-26: 4 mg via INTRAVENOUS
  Filled 2015-11-26: qty 100

## 2015-11-26 MED ORDER — SODIUM CHLORIDE 0.9 % IV SOLN: Freq: Once | INTRAVENOUS | Status: DC

## 2015-11-26 MED ORDER — SODIUM CHLORIDE 0.9 % IJ SOLN
10.0000 mL | INTRAMUSCULAR | Status: DC | PRN
Start: 2015-11-26 — End: 2015-11-26
  Administered 2015-11-26: 10 mL via INTRAVENOUS
  Filled 2015-11-26: qty 10

## 2015-11-26 MED ORDER — SODIUM CHLORIDE 0.9 % IJ SOLN
10.0000 mL | INTRAMUSCULAR | Status: DC | PRN
  Administered 2015-11-26: 10 mL
  Filled 2015-11-26: qty 10

## 2015-11-26 MED ORDER — DIPHENHYDRAMINE HCL 25 MG PO CAPS
25.0000 mg | ORAL_CAPSULE | Freq: Once | ORAL | Status: AC
  Administered 2015-11-26: 25 mg via ORAL

## 2015-11-26 NOTE — Patient Instructions (Signed)
[facility] Discharge Instructions for Patients Receiving Chemotherapy  Today you received the following chemotherapy agents Herceptin and Zometa. To help prevent nausea and vomiting after your treatment, we encourage you to take your nausea medication as directed.  If you develop nausea and vomiting that is not controlled by your nausea medication, call the clinic.   BELOW ARE SYMPTOMS THAT SHOULD BE REPORTED IMMEDIATELY:  *FEVER GREATER THAN 100.5 F  *CHILLS WITH OR WITHOUT FEVER  NAUSEA AND VOMITING THAT IS NOT CONTROLLED WITH YOUR NAUSEA MEDICATION  *UNUSUAL SHORTNESS OF BREATH  *UNUSUAL BRUISING OR BLEEDING  TENDERNESS IN MOUTH AND THROAT WITH OR WITHOUT PRESENCE OF ULCERS  *URINARY PROBLEMS  *BOWEL PROBLEMS  UNUSUAL RASH Items with * indicate a potential emergency and should be followed up as soon as possible.  Feel free to call the clinic you have any questions or concerns. The clinic phone number is [phone].  Please show the [facility] at check-in to the Emergency Department and triage nurse.

## 2015-11-27 ENCOUNTER — Ambulatory Visit: Payer: Medicare Other | Admitting: Physical Therapy

## 2015-11-27 DIAGNOSIS — R2689 Other abnormalities of gait and mobility: Secondary | ICD-10-CM

## 2015-11-27 DIAGNOSIS — M6281 Muscle weakness (generalized): Secondary | ICD-10-CM

## 2015-11-27 DIAGNOSIS — R293 Abnormal posture: Secondary | ICD-10-CM | POA: Diagnosis not present

## 2015-11-27 DIAGNOSIS — H8111 Benign paroxysmal vertigo, right ear: Secondary | ICD-10-CM

## 2015-11-27 DIAGNOSIS — R2681 Unsteadiness on feet: Secondary | ICD-10-CM

## 2015-11-27 NOTE — Therapy (Addendum)
[facility] [address] [place], [place], [address] Phone: [phone]   Fax:  [phone]  Physical Therapy Treatment  Patient Details  Name: Heather [name] MRN: [id number] Date of Birth: 03/24/1946 Referring Provider: Alger Simons, MD  Encounter Date: 11/27/2015      PT End of Session - 11/27/15 1307    Visit Number 45   Number of Visits 23   Date for PT Re-Evaluation 12/13/15   Authorization Type Medicare Traditional primary; BCBS secondary      Authorization Time Period * KX Modified required;    G Codes and progress note required every 10 visits   PT Start Time 0931   PT Stop Time 1024   PT Time Calculation (min) 53 min   Equipment Utilized During Treatment --   Activity Tolerance Patient tolerated treatment well   Behavior During Therapy [facility] for tasks assessed/performed      Past Medical History  Diagnosis Date  . Chronic kidney disease   . Hx: UTI (urinary tract infection)   . Right shoulder pain   . Right knee pain   . Hypertension     recently started Aldactone   . Hyperlipidemia     but not on meds;diet and exercise controlled  . Dizziness     has been going on for 34month;medical MD aware  . H/O hiatal hernia   . GERD (gastroesophageal reflux disease)     Tums prn  . Hemorrhoids   . Urinary frequency     d/t taking Aldactone  . History of UTI   . Anemia     hx of  . Cataract     immature-not sure of which eye  . Anxiety     r/t updated surgery  . Insomnia     takes Melatoniin daily  . [place]     right  . Skin cancer   . Hx of radiation therapy 10/25/12- 12/11/12    right chest wall/regional lymph nodes 5040 cGy, 28 sessions, right chest wall boost 1000 cGy 1 session  . H. pylori infection   . Metastasis to spinal column (HCC)     T5  . Complication of anesthesia     pt states she is sensitive to meds  . PONV (postoperative nausea and vomiting)     pt experienced hair  loss, confusion and combative- 02/15/15    Past Surgical History  Procedure Laterality Date  . Cyst removed from left breast  1970  . Left wrist surgery   2004    with plate  . Colonoscopy    . Esophagogastroduodenoscopy    . Mastectomy, radical    . Mastectomy modified radical  05/10/2012    Procedure: MASTECTOMY MODIFIED RADICAL;  Surgeon: PMerrie Roof MD;  [facility]  Service: General;  Laterality: Right;  RIGHT MODIFIED RADICAL MASTECTOMY  . Portacath placement  05/10/2012    Procedure: INSERTION PORT-A-CATH;  Surgeon: PMerrie Roof MD;  [facility]  Service: General;  Laterality: Left;  . Decompressive lumbar laminectomy level 4 N/A 02/15/2015    Procedure: DECOMPRESSION T5 AND T3-T7 STABALIZATION;  Surgeon: NConsuella Lose MD;  [facility];  Service: Neurosurgery;  Laterality: N/A;  . Laminectomy N/A 03/27/2015    Procedure: Thoracic Four-Thoracic Six Laminectomy for tumor;  Surgeon: NConsuella Lose MD;  [facility];  Service: Neurosurgery;  Laterality: N/A;  T4-T6 Laminectomy    There were no vitals filed for  this visit.      Subjective Assessment - 11/27/15 1303    Subjective "I'm getting pretty lightheaded at home when I'm in the kitchen, chopping things. Last time it happened, I had just exercised on the recumbent bike." Upon further questioning, pt notes she often turns head/body to L side repeatedly when working in the kitchen.    Pertinent History No spinal restrictions; osteoporosis in spine.   Limitations House hold activities;Standing;Walking   How long can you walk comfortably? first time 2/13: 30-40 ft with RW   Patient Stated Goals 6/7: "To be able to get down on ground to garden and weed; to walk without my braces - or at least make my braces hinged."   Currently in Pain? No/denies                Vestibular Assessment - 11/27/15 0001    Symptom Behavior   Type of Dizziness Lightheadedness   Frequency of  Dizziness daily   Duration of Dizziness minutes   Aggravating Factors Lying supine;Comment  after exertion   Relieving Factors Head stationary;Comments  lying down with elevated HOB   Occulomotor Exam   Occulomotor Alignment Normal   Spontaneous Absent   Gaze-induced Left beating nystagmus with L gaze   Smooth Pursuits Intact   Saccades Intact   Positional Testing   Dix-Hallpike Dix-Hallpike Right   Sidelying Test Sidelying Right;Sidelying Left   Horizontal Canal Testing Horizontal Canal Right;Horizontal Canal Left   Dix-Hallpike Right   Dix-Hallpike Right Duration 5 seconds   Dix-Hallpike Right Symptoms Upbeat, right rotatory nystagmus   Sidelying Right   Sidelying Right Duration Approx. 6-8 sec   Sidelying Right Symptoms Upbeat, right rotatory nystagmus   Sidelying Left   Sidelying Left Duration Initially reports lightheadedness; no true vertigo   Sidelying Left Symptoms No nystagmus   Horizontal Canal Right   Horizontal Canal Right Duration Lightheadedness; no true vertigo   Horizontal Canal Right Symptoms Normal   Horizontal Canal Left   Horizontal Canal Left Duration NA   Horizontal Canal Left Symptoms Normal   Orthostatics   BP supine (x 5 minutes) 138/94 mmHg   HR supine (x 5 minutes) 68   BP standing (after 1 minute) 140/70 mmHg  asymptomatic                  Vestibular Treatment/Exercise - 11/27/15 0001    Vestibular Treatment/Exercise   Vestibular Treatment Provided Canalith Repositioning   Canalith Repositioning Epley Manuever Right   Habituation Exercises Brandt Daroff;Horizontal Roll    EPLEY MANUEVER RIGHT   Number of Reps  1   Overall Response Symptoms Resolved   Response Details  Reassessment of R Dix-Hallpike reveals no symptoms, no nystagmus.   Nestor Lewandowsky   Number of Reps  2   Symptom Description  Cueing for technique   Horizontal Roll   Number of Reps  3   Symptom Description  Fast rolling (as slower rolling did not evoke symptoms)                PT Education - 11/27/15 1305    Education provided Yes   Education Details Discussed vestibular assessment findings, implications. Provided HEP for habituation; see Pt Instructons for details.    Person(s) Educated Patient   Methods Demonstration;Explanation;Handout;Verbal cues   Comprehension Verbalized understanding;Returned demonstration          PT Short Term Goals - 11/19/15 1307    PT SHORT TERM GOAL #9   TITLE Pt will  improve Berg score to > 45/56 to indicate decreased risk of falling.   (11/11/15)   Baseline 48/56, 11/19/15.   Time 4   Period Weeks   Status Achieved   PT SHORT TERM GOAL #10   TITLE Pt will transfer from standing to/from tall kneeling on ground using standard chair requiring min A to progress toward pt ability to garden. (11/11/15)   Baseline met 11/19/15   Time 4   Period Weeks   Status Achieved   PT SHORT TERM GOAL #11   TITLE Pt will negotiate standard ramp and curb step with mod I using LRAD to indicate safety traversing community obstacles. (11/11/15)   Baseline ModI ramp with forearm crutches; supervsion curb with forearm crutches; both with GRAFO donned; 11/19/15.   Time 4   Period Weeks   Status Partially Met   PT SHORT TERM GOAL #12   TITLE Pt will demo B ankle plantar flexion strength of 2+/5 (partial ROM heel raise against gravity) to progress toward pt-stated goal of progressing to hinged AFO's.  (11/11/15)   Baseline 6/19: Deferred, as it would not be appropriate to hinge GRAFO's (and ankle PF strength is 4/5 bilaterally)   Status Deferred           PT Long Term Goals - 10/26/15 1300    PT LONG TERM GOAL #5   Title ambulate > 500' on various indoor/outdoor surfaces modified independent for improved function and mobility with LRAD (10/16/15)   Status On-going   PT LONG TERM GOAL #6   Title improve gait velocity to > 2.5 ft/sec for improved function and mobility (10/16/15)   Baseline 6/7: gait velocity = 2.32 ft/sec   Status  On-going   PT LONG TERM GOAL #7   Title BERG:  >/= 38/56     (10/16/15)   Baseline Achieved 6/7 with Berg score of 42/56.   Status Achieved   PT LONG TERM GOAL #8   Title Pt will ambulate >100' over unlevel, grass surfaces with mod I using LRAD to enable pt to safely traverse yard at home. (12/09/15)   Time 8   Period Weeks   Status On-going   PT LONG TERM GOAL  #9   TITLE Pt will transfer from standing to/from tall kneeling on ground using standard chair with mod I to enable pt to safely return to gardening.  (12/09/15)   Time 8   Period Weeks   Status On-going   PT LONG TERM GOAL  #10   TITLE Pt will demo B ankle plantar flexion strength of 3/5 (full ROM heel raise against gravity) to address pt-stated goal of progressing to hinged AFO's.  (12/09/15)   Baseline 6/19: Deferred, as it would not be appropriate to hinge GRAFO's (and ankle PF strength is 4/5 bilaterally)   Time 8   Period Weeks   Status Deferred               Plan - 11/27/15 1314    Clinical Impression Statement Pt arrived to session with report of dizziness; onset over past week. Vestibular assessment reveals the following: (+) and symptomatic L Head Thrust Test; R Dix-Hallpike with R upbeating torsional nystagmus x5 seconds; motion sensitivity with R rolling. Unable to rule out R posterior canalithiasis and vestibular hypofunction. Orthostatic vital signs negative. Treated with R Epley maneuver x1 trial. Reassessment reveals no nystagmus, asymptomatic. Educated pt on [employer] and fast rolling for habituation.    Rehab Potential Good   Clinical Impairments Affecting  Rehab Potential excellent progress with mobility since beginning outpatient PT   PT Frequency Other (comment)  2x/week for 4 weeks followed by 1x/week for 4 weeks   PT Duration Other (comment)  See above.   PT Treatment/Interventions ADLs/Self Care Home Management;Electrical Stimulation;Therapeutic exercise;Therapeutic activities;Functional mobility  training;Stair training;Gait training;DME Instruction;Balance training;Neuromuscular re-education;Patient/family education;Orthotic Fit/Training;Canalith Repostioning;Vestibular   PT Next Visit Plan Assess for BPPV (R PC) and treat prn. If no motion sensitivity, review HEP, remove habituation, and progress corner balance.   Consulted and Agree with Plan of Care Patient      Patient will benefit from skilled therapeutic intervention in order to improve the following deficits and impairments:  Abnormal gait, Difficulty walking, Pain, Impaired sensation, Decreased strength, Decreased mobility, Decreased balance, Dizziness, Decreased coordination  Visit Diagnosis: BPPV (benign paroxysmal positional vertigo), right  Other abnormalities of gait and mobility  Unsteadiness on feet  Muscle weakness (generalized)     Problem List Patient Active Problem List   Diagnosis Date Noted  . Anemia of chronic disease 05/21/2015  . Metastatic cancer to spine ([place]) 03/27/2015  . Thrombocytopenia ([place]) 03/12/2015  . Prerenal azotemia 03/12/2015  . Pain   . Recurrent UTI--due to Klebsiella   . Paraplegia at T4 level ([facility]) 02/20/2015  . Neurogenic bowel 02/20/2015  . Neurogenic bladder 02/20/2015  . Postoperative anemia due to acute blood loss 02/17/2015  . Spinal cord compression due to malignant neoplasm metastatic to spine ([place]) 02/15/2015  . Epidural mass 02/15/2015  . Thoracic spine tumor   . Pathologic fracture of vertebra 02/14/2015  . Pathologic fracture of thoracic vertebrae 02/14/2015  . Breast cancer metastasized to bone ([place]) 02/14/2015  . Hyperlipidemia 02/14/2015  . H. pylori infection   . HTN (hypertension) 04/17/2012  . Primary cancer of upper outer quadrant of right female breast ([place]) 03/08/2012    Billie Ruddy, PT, [facility] [address] [place], [place], [address] Phone: [phone]   Fax:  [phone] 11/27/2015, 1:17  PM  Name: Heather [name] MRN: [id number] Date of Birth: 1945-10-17

## 2015-11-27 NOTE — Patient Instructions (Addendum)
Tip Card 1.The goal of habituation training is to assist in decreasing symptoms of vertigo, dizziness, or nausea provoked by specific head and body motions. 2.These exercises may initially increase symptoms; however, be persistent and work through symptoms. With repetition and time, the exercises will assist in reducing or eliminating symptoms. 3.Exercises should be stopped and discussed with the therapist if you experience any of the following: - Sudden change or fluctuation in hearing - New onset of ringing in the ears, or increase in current intensity - Any fluid discharge from the ear - Severe pain in neck or back - Extreme nausea  Copyright  VHI. All rights reserved.  Rolling   With pillow under head, start on back. Roll quickly to your right side.  Hold until dizziness stops, plus 20 seconds and then roll quickly to the left side.  Hold until dizziness stops, plus 20 seconds.  Repeat sequence 5 times per session. Do 2 sessions per day.  Copyright  VHI. All rights reserved.  Sit to Side-Lying   Sit on edge of bed. Lie down onto the right side and hold until dizziness stops, plus 20 seconds.  Return to sitting and wait until dizziness stops, plus 20 seconds.  Repeat to the left side. Repeat sequence 5 times per session. Do 2 sessions per day.  Copyright  VHI. All rights reserved.

## 2015-12-03 ENCOUNTER — Ambulatory Visit: Payer: Medicare Other | Admitting: Physical Therapy

## 2015-12-10 ENCOUNTER — Ambulatory Visit: Payer: Medicare Other | Admitting: Physical Therapy

## 2015-12-16 ENCOUNTER — Other Ambulatory Visit: Payer: Self-pay | Admitting: Hematology and Oncology

## 2015-12-16 DIAGNOSIS — C50112 Malignant neoplasm of central portion of left female breast: Secondary | ICD-10-CM

## 2015-12-16 NOTE — Patient Instructions (Signed)
Do these exercises on Monday, Wednesday, and Friday:  Knee Extension: Terminal - Standing (Single Leg)   Do this exercise without your braces on. Place your full-length mirror on your RIGHT side to make sure your right knee doesn't go into hyperextension.  Loop your YELLOW resistance band around your right knee. With both hands on your walker, perform a squat, making sure your knees don't move over your toes. Perform 12 reps with husband holding band directly in front of you. Perform 8-10 reps with your husband holding the band behind you.   Perform this exercise 1-2 times per day.   To Progress: increase by 2-3 reps at a time, until you're able to perform 20 consecutive reps with the Yellow resistance band. Then, progress to GREEN band, starting back at 8-10 reps and increasing by 2-3 reps at a time until you're able to perform 20 reps. Continue to progress in this way (yellow >> green >> blue >> black) until you're able to perform 20 reps using black resistance band.  Abduction: Clam (Eccentric) - Side-Lying    Lie on side with knees bent. Lift top knee, keeping feet together. Hold onto edge of bed with top hand to prevent hips from rolling backwards.You should feel this in the muscle on the back of your hip. Slowly lower for 3-5 seconds.  Perform __20__ reps on your LEFT leg and __15__ reps on your RIGHT.  Do this exercise __1-2__ times per day.  To Progress: Perform exercise below, starting at 5 reps, and increasing incrementally by 2-3 reps at a time until you're able to perform 20 consecutive reps.  Abduction: Side Leg Lift (Eccentric) - Side-Lying    Lie on side. Lift top leg slightly higher than shoulder level. Keep top leg straight with body, toes pointing forward. Slowly lower for 3-5 seconds.   Hamstrings    Lie on stomach without a weight on (for now). Bend your knee __about 90__ degrees, pointing toes toward knee. Do not bend hips. Hold __2__ seconds, then  slowly lower back to resting position. Repeat _15-20___ times. Do __1-2__ sessions per day on each leg.      To Progress: When you're able to perform 20 reps consecutively without difficulty, add 2 lb weight and start back at 8-10 consecutive reps, 2-3 sets per day on each leg. Increase by 2-3 reps at a time until you're able to perform 20 consecutive reps with 2 lb weight, then increase to 3 lb weight. Continue to progress in this way.    Hip Adductor - V Stretch   Sit with legs open in a wide V, toes pointing up, hands on knees. Keep spine straight supporting trunk with arms. Slide arms down leg as trunk tips forward. Press knees apart. Hold _30-45__ seconds. Repeat __4__ times per day.  Do these exercises on Tuesday, Thursday, and Saturday:  Bracing With Leg Raise (Quadruped)    On hands and knees find neutral spine. Tighten pelvic floor and abdominals and hold. Alternating legs, straighten and lift to hip level. Repeat _12__ times. Do __2-3_ times a day. Increase by 1-2 reps at a time, as tolerated.   To progress: When you're able to perform 20 consecutive reps without difficulty, try to raise opposite arm/leg (right leg and left arm) simultaneously. You may want to have your husband guard you at first.    Marching    Sit on your physioball. Engage core musculature as practiced in therapy. March in place for __10__ consecutive reps. Do _2-3__  times per day.  To progress: Increase by 1-2 reps at a time, until you're able to perfrom 30 consecutive reps. Then, progress by elevating opposite arm/opposite leg (right arm/left leg), alternating sides. When 30 reps becomes easy, progress to raising the same arm, same leg (right arm, right leg), starting at 8-10 reps, and progressing to 30 reps.  Copyright  VHI. All rights reserved.    Balance: Eyes Closed - Bilateral (Varied Surfaces)    Stand with your back to a corner with a stable chair in front of you. Stand with feet  shoulder width, close eyes.Don't stand on a pillow (yet). Be sure your knees remain soft ("unlocked").  Maintain balance _20___ seconds. Repeat __4__ times per set. Do __2__ sets per day.   To progress: Stand on 1 pillow with eyes closed; hold 30 seconds. As you progress, bring feet more closely, until able to stand on 1 pillow with feet together, eyes closed for 30 seconds without difficulty. At this point, try standing with feet apart on 1 pillow with eyes closed and perform slow head turns right to left 5 times, up/down 5 times. You may want to have your husband guard you at first.  Feet Apart, Head Motion - Eyes Open    Stand with your back to a corner with a stable chair in front of you. With eyes open, feet apart, move head slowly: up and down 10 times; right to left 10 times. Repeat __2__ times per day.  To progress: Perform head turns while standing on 1 pillow with feet shoulder-width apart. As this becomes less difficult, bring feet more closely, as safe, until you're able to perform 10 head turns with feet touching.

## 2015-12-16 NOTE — Therapy (Signed)
[facility] [address] [place], [place], [address] Phone: [phone]   Fax:  [phone]  Patient Details  Name: Heather [name] MRN: [id number] Date of Birth: 12-02-1945 Referring Provider:  [name], MD  Encounter Date: 12/10/2015   PHYSICAL THERAPY DISCHARGE SUMMARY  Visits from Start of Care: 45  Current functional level related to goals / functional outcomes: Unknown, as patient was unable to return to PT after last visit; therefore, LTG's not formally assessed.      PT Long Term Goals - 10/26/15 1300      PT LONG TERM GOAL #5   Title ambulate > 500' on various indoor/outdoor surfaces modified independent for improved function and mobility with LRAD (10/16/15)   Status On-going     PT LONG TERM GOAL #6   Title improve gait velocity to > 2.5 ft/sec for improved function and mobility (10/16/15)   Baseline 6/7: gait velocity = 2.32 ft/sec   Status On-going     PT LONG TERM GOAL #7   Title BERG:  >/= 38/56     (10/16/15)   Baseline Achieved 6/7 with Berg score of 42/56.   Status Achieved     PT LONG TERM GOAL #8   Title Pt will ambulate >100' over unlevel, grass surfaces with mod I using LRAD to enable pt to safely traverse yard at home. (12/09/15)   Time 8   Period Weeks   Status On-going     PT LONG TERM GOAL  #9   TITLE Pt will transfer from standing to/from tall kneeling on ground using standard chair with mod I to enable pt to safely return to gardening.  (12/09/15)   Time 8   Period Weeks   Status On-going     PT LONG TERM GOAL  #10   TITLE Pt will demo B ankle plantar flexion strength of 3/5 (full ROM heel raise against gravity) to address pt-stated goal of progressing to hinged AFO's.  (12/09/15)   Baseline 6/19: Deferred, as it would not be appropriate to hinge GRAFO's (and ankle PF strength is 4/5 bilaterally)   Time 8   Period Weeks   Status Deferred        Remaining deficits: Unknown, as  patient was unable to return to PT after last visit. However, based on last PT session, pt continues to demo gait and balance impairments associated with paraparesis.   Education / Equipment: HEP and progression; education on BPPV and habituation.   Plan: Patient agrees to discharge.  Patient goals were partially met. Patient is being discharged due to financial reasons.  ?????            Billie Ruddy, PT, [facility] [address] [place], [place], [address] Phone: [phone]   Fax:  [phone] 12/16/15, 6:02 PM

## 2015-12-17 ENCOUNTER — Telehealth: Payer: Self-pay

## 2015-12-17 ENCOUNTER — Ambulatory Visit (HOSPITAL_BASED_OUTPATIENT_CLINIC_OR_DEPARTMENT_OTHER): Payer: Medicare Other

## 2015-12-17 VITALS — BP 163/75 | HR 72 | Temp 98.3°F | Resp 16

## 2015-12-17 DIAGNOSIS — C50411 Malignant neoplasm of upper-outer quadrant of right female breast: Secondary | ICD-10-CM

## 2015-12-17 DIAGNOSIS — Z5112 Encounter for antineoplastic immunotherapy: Secondary | ICD-10-CM

## 2015-12-17 DIAGNOSIS — C50911 Malignant neoplasm of unspecified site of right female breast: Secondary | ICD-10-CM

## 2015-12-17 DIAGNOSIS — C7951 Secondary malignant neoplasm of bone: Secondary | ICD-10-CM | POA: Diagnosis not present

## 2015-12-17 MED ORDER — DIPHENHYDRAMINE HCL 25 MG PO CAPS
ORAL_CAPSULE | ORAL | Status: AC
  Filled 2015-12-17: qty 1

## 2015-12-17 MED ORDER — SODIUM CHLORIDE 0.9 % IJ SOLN
10.0000 mL | INTRAMUSCULAR | Status: DC | PRN
  Administered 2015-12-17: 10 mL
  Filled 2015-12-17: qty 10

## 2015-12-17 MED ORDER — DIPHENHYDRAMINE HCL 25 MG PO CAPS
25.0000 mg | ORAL_CAPSULE | Freq: Once | ORAL | Status: AC
  Administered 2015-12-17: 25 mg via ORAL

## 2015-12-17 MED ORDER — ACETAMINOPHEN 325 MG PO TABS
650.0000 mg | ORAL_TABLET | Freq: Once | ORAL | Status: AC
  Administered 2015-12-17: 650 mg via ORAL

## 2015-12-17 MED ORDER — SODIUM CHLORIDE 0.9 % IV SOLN
Freq: Once | INTRAVENOUS | Status: AC
  Administered 2015-12-17: 10:00:00 via INTRAVENOUS

## 2015-12-17 MED ORDER — TRASTUZUMAB CHEMO 150 MG IV SOLR
6.0000 mg/kg | Freq: Once | INTRAVENOUS | Status: AC
  Administered 2015-12-17: 399 mg via INTRAVENOUS
  Filled 2015-12-17: qty 19

## 2015-12-17 MED ORDER — HEPARIN SOD (PORK) LOCK FLUSH 100 UNIT/ML IV SOLN
500.0000 [IU] | Freq: Once | INTRAVENOUS | Status: AC | PRN
  Administered 2015-12-17: 500 [IU]
  Filled 2015-12-17: qty 5

## 2015-12-17 MED ORDER — ACETAMINOPHEN 325 MG PO TABS
ORAL_TABLET | ORAL | Status: AC
  Filled 2015-12-17: qty 2

## 2015-12-17 NOTE — Telephone Encounter (Signed)
Pt in for infusion and asking about CT CAP which was expected to be done this month.  Pt states she has not been told a date and time for this.  Authorization received from [employer].  Called central scheduling to schedule this for pt.  Scan scheduled for 12/23/15 at 0900.  Called to inform pt of date and time but unable to reach pt. Left VM including date and time of scan.  Included contrast schedule.  Pt to consume first bottle of oral contrast at 0700 and second bottle at 0800.  Pt also to have nothing to eat or drink after midnight.  All information relayed to pt and pt encouraged to contact with any questions or concerns she may have.

## 2015-12-17 NOTE — Patient Instructions (Signed)
[facility] Discharge Instructions for Patients Receiving Chemotherapy  Today you received the following chemotherapy agents herceptin   To help prevent nausea and vomiting after your treatment, we encourage you to take your nausea medication as directed   If you develop nausea and vomiting that is not controlled by your nausea medication, call the clinic.   BELOW ARE SYMPTOMS THAT SHOULD BE REPORTED IMMEDIATELY:  *FEVER GREATER THAN 100.5 F  *CHILLS WITH OR WITHOUT FEVER  NAUSEA AND VOMITING THAT IS NOT CONTROLLED WITH YOUR NAUSEA MEDICATION  *UNUSUAL SHORTNESS OF BREATH  *UNUSUAL BRUISING OR BLEEDING  TENDERNESS IN MOUTH AND THROAT WITH OR WITHOUT PRESENCE OF ULCERS  *URINARY PROBLEMS  *BOWEL PROBLEMS  UNUSUAL RASH Items with * indicate a potential emergency and should be followed up as soon as possible.  Feel free to call the clinic you have any questions or concerns. The clinic phone number is [phone].  

## 2015-12-23 ENCOUNTER — Ambulatory Visit (HOSPITAL_COMMUNITY)
Admission: RE | Admit: 2015-12-23 | Discharge: 2015-12-23 | Disposition: A | Discharge status: Home / Self Care | Payer: Medicare Other | Source: Ambulatory Visit | Attending: Hematology and Oncology | Admitting: Hematology and Oncology

## 2015-12-23 ENCOUNTER — Encounter (HOSPITAL_COMMUNITY): Payer: Self-pay

## 2015-12-23 DIAGNOSIS — R918 Other nonspecific abnormal finding of lung field: Secondary | ICD-10-CM | POA: Diagnosis not present

## 2015-12-23 DIAGNOSIS — R590 Localized enlarged lymph nodes: Secondary | ICD-10-CM | POA: Diagnosis not present

## 2015-12-23 DIAGNOSIS — K449 Diaphragmatic hernia without obstruction or gangrene: Secondary | ICD-10-CM | POA: Diagnosis not present

## 2015-12-23 DIAGNOSIS — Z981 Arthrodesis status: Secondary | ICD-10-CM | POA: Insufficient documentation

## 2015-12-23 DIAGNOSIS — C50411 Malignant neoplasm of upper-outer quadrant of right female breast: Secondary | ICD-10-CM | POA: Insufficient documentation

## 2015-12-23 DIAGNOSIS — I700 Atherosclerosis of aorta: Secondary | ICD-10-CM | POA: Diagnosis not present

## 2015-12-23 DIAGNOSIS — C50911 Malignant neoplasm of unspecified site of right female breast: Secondary | ICD-10-CM | POA: Diagnosis not present

## 2015-12-23 MED ORDER — IOPAMIDOL (ISOVUE-300) INJECTION 61%
100.0000 mL | Freq: Once | INTRAVENOUS | Status: AC | PRN
  Administered 2015-12-23: 100 mL via INTRAVENOUS

## 2015-12-30 ENCOUNTER — Telehealth: Payer: Self-pay | Admitting: Hematology and Oncology

## 2015-12-30 NOTE — Telephone Encounter (Signed)
I called and discussed the results of the CT scan. The CT scan showed a 6 mm new mediastinal lymph node which could be reactive or malignant. It also showed small pulmonary nodules in the lower lobes of the lungs that were very small and could be inflammatory in nature. Groundglass opacities that are posttreatment changes from radiation.  Plan: 1. Repeat CT scan in 2 months to assess stability of the lymph node 2. no change in the treatment plan at this time. 3. I reassured her that these changes could be reactive and could improve on follow-up CT scan. 4. If the lymph node gets bigger, then we have to determine if it could be biopsied. If it could not be biopsied then we may have to contemplate whether treatment changes warranted.  Patient agrees with the plan.

## 2016-01-07 ENCOUNTER — Other Ambulatory Visit (HOSPITAL_BASED_OUTPATIENT_CLINIC_OR_DEPARTMENT_OTHER): Payer: Medicare Other

## 2016-01-07 ENCOUNTER — Ambulatory Visit (HOSPITAL_BASED_OUTPATIENT_CLINIC_OR_DEPARTMENT_OTHER): Payer: Medicare Other | Admitting: Hematology and Oncology

## 2016-01-07 ENCOUNTER — Ambulatory Visit (HOSPITAL_COMMUNITY)
Admission: RE | Admit: 2016-01-07 | Discharge: 2016-01-07 | Disposition: A | Discharge status: Home / Self Care | Payer: Medicare Other | Source: Ambulatory Visit | Attending: Internal Medicine | Admitting: Internal Medicine

## 2016-01-07 ENCOUNTER — Ambulatory Visit (HOSPITAL_BASED_OUTPATIENT_CLINIC_OR_DEPARTMENT_OTHER)
Admission: RE | Admit: 2016-01-07 | Discharge: 2016-01-07 | Disposition: A | Discharge status: Home / Self Care | Payer: Medicare Other | Source: Ambulatory Visit | Attending: Internal Medicine | Admitting: Internal Medicine

## 2016-01-07 ENCOUNTER — Encounter (HOSPITAL_COMMUNITY): Payer: Self-pay | Admitting: Internal Medicine

## 2016-01-07 ENCOUNTER — Ambulatory Visit: Payer: Medicare Other

## 2016-01-07 ENCOUNTER — Ambulatory Visit (HOSPITAL_BASED_OUTPATIENT_CLINIC_OR_DEPARTMENT_OTHER): Payer: Medicare Other

## 2016-01-07 ENCOUNTER — Encounter: Payer: Self-pay | Admitting: Hematology and Oncology

## 2016-01-07 VITALS — BP 145/71 | HR 80 | Temp 98.5°F | Resp 18 | Ht 64.0 in | Wt 136.2 lb

## 2016-01-07 VITALS — BP 146/84 | HR 76 | Resp 99 | Wt 137.0 lb

## 2016-01-07 DIAGNOSIS — C50411 Malignant neoplasm of upper-outer quadrant of right female breast: Secondary | ICD-10-CM

## 2016-01-07 DIAGNOSIS — C50911 Malignant neoplasm of unspecified site of right female breast: Secondary | ICD-10-CM

## 2016-01-07 DIAGNOSIS — F419 Anxiety disorder, unspecified: Secondary | ICD-10-CM | POA: Insufficient documentation

## 2016-01-07 DIAGNOSIS — C7951 Secondary malignant neoplasm of bone: Secondary | ICD-10-CM

## 2016-01-07 DIAGNOSIS — I340 Nonrheumatic mitral (valve) insufficiency: Secondary | ICD-10-CM | POA: Insufficient documentation

## 2016-01-07 DIAGNOSIS — I10 Essential (primary) hypertension: Secondary | ICD-10-CM

## 2016-01-07 DIAGNOSIS — D638 Anemia in other chronic diseases classified elsewhere: Secondary | ICD-10-CM

## 2016-01-07 DIAGNOSIS — Z5112 Encounter for antineoplastic immunotherapy: Secondary | ICD-10-CM | POA: Diagnosis present

## 2016-01-07 DIAGNOSIS — D649 Anemia, unspecified: Secondary | ICD-10-CM | POA: Insufficient documentation

## 2016-01-07 DIAGNOSIS — C773 Secondary and unspecified malignant neoplasm of axilla and upper limb lymph nodes: Secondary | ICD-10-CM | POA: Diagnosis not present

## 2016-01-07 DIAGNOSIS — G63 Polyneuropathy in diseases classified elsewhere: Principal | ICD-10-CM

## 2016-01-07 DIAGNOSIS — I129 Hypertensive chronic kidney disease with stage 1 through stage 4 chronic kidney disease, or unspecified chronic kidney disease: Secondary | ICD-10-CM | POA: Diagnosis not present

## 2016-01-07 DIAGNOSIS — Z170 Estrogen receptor positive status [ER+]: Secondary | ICD-10-CM

## 2016-01-07 DIAGNOSIS — E785 Hyperlipidemia, unspecified: Secondary | ICD-10-CM | POA: Diagnosis not present

## 2016-01-07 DIAGNOSIS — N189 Chronic kidney disease, unspecified: Secondary | ICD-10-CM | POA: Insufficient documentation

## 2016-01-07 DIAGNOSIS — C801 Malignant (primary) neoplasm, unspecified: Secondary | ICD-10-CM

## 2016-01-07 DIAGNOSIS — Z09 Encounter for follow-up examination after completed treatment for conditions other than malignant neoplasm: Secondary | ICD-10-CM | POA: Diagnosis present

## 2016-01-07 LAB — COMPREHENSIVE METABOLIC PANEL WITH GFR
ALT: 17 U/L (ref 0–55)
AST: 23 U/L (ref 5–34)
Albumin: 3.8 g/dL (ref 3.5–5.0)
Alkaline Phosphatase: 82 U/L (ref 40–150)
Anion Gap: 10 meq/L (ref 3–11)
BUN: 14.9 mg/dL (ref 7.0–26.0)
CO2: 27 meq/L (ref 22–29)
Calcium: 9.6 mg/dL (ref 8.4–10.4)
Chloride: 101 meq/L (ref 98–109)
Creatinine: 0.9 mg/dL (ref 0.6–1.1)
EGFR: 67 ml/min/1.73 m2 — ABNORMAL LOW
Glucose: 163 mg/dL — ABNORMAL HIGH (ref 70–140)
Potassium: 3.7 meq/L (ref 3.5–5.1)
Sodium: 137 meq/L (ref 136–145)
Total Bilirubin: 0.5 mg/dL (ref 0.20–1.20)
Total Protein: 7.3 g/dL (ref 6.4–8.3)

## 2016-01-07 LAB — CBC WITH DIFFERENTIAL/PLATELET
BASO%: 0.4 % (ref 0.0–2.0)
Basophils Absolute: 0 10e3/uL (ref 0.0–0.1)
EOS%: 0.8 % (ref 0.0–7.0)
Eosinophils Absolute: 0 10e3/uL (ref 0.0–0.5)
HCT: 38.9 % (ref 34.8–46.6)
HGB: 12.8 g/dL (ref 11.6–15.9)
LYMPH%: 14.8 % (ref 14.0–49.7)
MCH: 27.3 pg (ref 25.1–34.0)
MCHC: 32.9 g/dL (ref 31.5–36.0)
MCV: 82.9 fL (ref 79.5–101.0)
MONO#: 0.4 10e3/uL (ref 0.1–0.9)
MONO%: 7.5 % (ref 0.0–14.0)
NEUT#: 4.1 10e3/uL (ref 1.5–6.5)
NEUT%: 76.5 % (ref 38.4–76.8)
Platelets: 192 10e3/uL (ref 145–400)
RBC: 4.69 10e6/uL (ref 3.70–5.45)
RDW: 14.3 % (ref 11.2–14.5)
WBC: 5.3 10e3/uL (ref 3.9–10.3)
lymph#: 0.8 10e3/uL — ABNORMAL LOW (ref 0.9–3.3)
nRBC: 0 % (ref 0–0)

## 2016-01-07 MED ORDER — SODIUM CHLORIDE 0.9 % IJ SOLN
10.0000 mL | INTRAMUSCULAR | Status: DC | PRN
  Administered 2016-01-07: 10 mL
  Filled 2016-01-07: qty 10

## 2016-01-07 MED ORDER — DIPHENHYDRAMINE HCL 25 MG PO CAPS
25.0000 mg | ORAL_CAPSULE | Freq: Once | ORAL | Status: AC
  Administered 2016-01-07: 25 mg via ORAL

## 2016-01-07 MED ORDER — SODIUM CHLORIDE 0.9 % IV SOLN
6.0000 mg/kg | Freq: Once | INTRAVENOUS | Status: AC
  Administered 2016-01-07: 399 mg via INTRAVENOUS
  Filled 2016-01-07: qty 19

## 2016-01-07 MED ORDER — ACETAMINOPHEN 325 MG PO TABS
650.0000 mg | ORAL_TABLET | Freq: Once | ORAL | Status: AC
  Administered 2016-01-07: 650 mg via ORAL

## 2016-01-07 MED ORDER — HEPARIN SOD (PORK) LOCK FLUSH 100 UNIT/ML IV SOLN
500.0000 [IU] | Freq: Once | INTRAVENOUS | Status: AC | PRN
  Administered 2016-01-07: 500 [IU]
  Filled 2016-01-07: qty 5

## 2016-01-07 MED ORDER — SODIUM CHLORIDE 0.9 % IJ SOLN
10.0000 mL | INTRAMUSCULAR | Status: DC | PRN
  Administered 2016-01-07: 10 mL via INTRAVENOUS
  Filled 2016-01-07: qty 10

## 2016-01-07 MED ORDER — DIPHENHYDRAMINE HCL 25 MG PO CAPS
ORAL_CAPSULE | ORAL | Status: AC
  Filled 2016-01-07: qty 1

## 2016-01-07 MED ORDER — ACETAMINOPHEN 325 MG PO TABS
ORAL_TABLET | ORAL | Status: AC
  Filled 2016-01-07: qty 2

## 2016-01-07 MED ORDER — SODIUM CHLORIDE 0.9 % IV SOLN
Freq: Once | INTRAVENOUS | Status: AC
  Administered 2016-01-07: 15:00:00 via INTRAVENOUS

## 2016-01-07 NOTE — Patient Instructions (Signed)
We will contact you in 4 months to schedule your next appointment.  

## 2016-01-07 NOTE — Assessment & Plan Note (Signed)
Right breast invasive ductal carcinoma ER positive PR negative HER-2 positive Ki-67 44% multifocal disease 3/7 lymph nodes positive T2 N1 M0 stage IIB status post adjuvant chemotherapy with TCH followed by Herceptin maintenance, adjuvant radiation therapy, currently on letrozole since 02/07/2013. MRI 02/14/2015: T5 large destructive lesion with pathologic compression fracture and extensive epidural tumor, involvement of T4, excision metastatic carcinoma ER 60%, PR 0%, HER-2 positive ratio 2.71 average copy #7.45 03/27/2015: T4-6 decompression surgery for spinal cord compression Radiation therapy to the spine (Dr. [name]) 04/13/2015 to 05/19/2015  Treatment plan: 1. Resumed anastrozole 05/21/2015, but it has caused significant hair loss so we switched her to exemestane 25 mg once daily 07/23/2015 2. Herceptin every 3 week started 04/10/2015 3. Obtain echocardiograms every 3 months 4. Bone metastases: Zometa every 3 months. (Change made because of diffuse bone pain related to Zometa infusions) -------------------------------------------------------------------------------------------------------------------------- Goal of treatment: Palliation Echocardiogram 10/07/2015: EF 55-60% (stable from 07/2015 but overall down from 65-70%)   Zometa toxicities: bone pain and fatigue. If it causes persistent symptoms,  Zometa every 3 months.  Hypoproteinemia: patient trying to increase her dietary protein And her albumin level significant increase of 3.4 Patient is now able to walk with the help of crutches. This is a huge improvement from where she started.  Anemia of chronic disease:Hemoglobin is slowly improving. Today's hemoglobin is 12.3  CT chest abdomen pelvis 09/01/2015: Pathologic fracture with posterior fusion at T5 no other evidence of metastatic disease in the chest abdomen pelvis  CT CAP 12/23/2015: new nonspecific 0.6 cm lymph node was stated mediastinum needs follow-up CT, innumerable  tiny groundglass pulmonary nodules throughout both lungs unchanged, patchy consolidation from radiation, T5 fracture, no mets  Return to clinic every 3 weeks for Herceptin infusions and every 9 weeks for clinic follow-up.

## 2016-01-07 NOTE — Patient Instructions (Signed)

## 2016-01-07 NOTE — Addendum Note (Signed)
Encounter addended by: Scarlette Calico, RN on: 01/07/2016 11:44 AM<BR>    Actions taken: Sign clinical note

## 2016-01-07 NOTE — Progress Notes (Signed)
CARDIO-ONCOLOGY CLINIC NOTE  Patient ID: Heather [name], female   DOB: Jul 23, 1945, 70 y.o.   MRN: [id number]  Referring Oncologist: Heather [name] Oncologistt: Heather [name] PCP: Heather [name] General Surgeon: Heather [name]  HPI: Heather [name] is a 70 year old female with invasive ductal carcinoma in R breast that is ER positive and HER-2/neu positive,  S/P R mastectomy and lymph node dissection 05/10/12.    She has been treated for HTN in the past but stopped taking the medications due to elevated renal function. Intolerant Ace-I due to cough.    She received a total of 6 cycles of Taxotere/carboplatin starting on 06/14/2012 and finishing in May. XRT finished in August 2014. Now receiving Herceptin every 3 weeks to end at the end of February 2015.    She finished initial Herceptin 2015 but unfortunately then developed pathological vertebral fracture with cord compression and paralysis. Now back on Herceptin since 12/16.  Has been doing rehab. Now able to walk with walker and braces but still wobbly. Tolerating Herceptin well. No fevers or chills. No SOB or edema.     Primary cancer of upper outer quadrant of right female breast ([name])   03/08/2012 Initial Diagnosis Right breast invasive ductal carcinoma ER positive PR negative HER-2 positive Ki-67 44%; another breast mass biopsied in the anterior part of the breast which was also positive for malignancy that was HER-2 negative   05/10/2012 Surgery Right mastectomy and axillary lymph node dissection: Multifocal disease 5 cm, 1.7 cm, 1.6 cm, ER positive PR negative HER-2 positive Ki-67 44%, 3/7 lymph nodes positive   06/14/2012 - 06/12/2013 Chemotherapy Adjuvant chemotherapy with [place] 6 followed by Herceptin maintenance   10/25/2012 - 12/11/2012 Radiation Therapy Adjuvant radiation therapy   02/07/2013 -  Anti-estrogen oral therapy Letrozole 2.5 mg daily   02/14/2015 Imaging MRI spine: Large destructive T5 lesion with severe pathologic compression  fracture and extensive epidural tumor, severe spinal stenosis with moderate cord compression, tumor involvement of T4   03/18/2015 PET scan Residual enhancing soft tissue adjacent to the spinal cord. No evidence of metastatic disease. Nonspecific uptake the left nipple   03/27/2015 - 03/30/2015 Hospital Admission T4-6 decompression for spinal cord compression (lower extremity paralysis)   04/10/2015 -  Chemotherapy Palliative treatment with Herceptin every 3 weeks with letrozole 2.5 mg daily   04/13/2015 - 05/19/2015 Radiation Therapy Palliative radiation treatment to the spine   09/01/2015 Imaging CT chest abdomen pelvis: Pathologic fracture with posterior fusion at T5 no other evidence of metastatic disease in the chest abdomen pelvis         03/28/12 ECHO 60-65% Lateral S' 9.3 09/17/12 ECHO 55% lateral s' 9.4 03/05/13 ECHO EF 55% lateral s' 11.1 1/15 ECHO EF 60-65%, lateral s' 11.3, global longitudinal strain -22.9% 8/15 Echo EF 60% lateral s 11.2, GLS -25% 10/07/2015  ECHO EF 55-60% laterals s' 8.7 cm/s GLS -21.1% mild MR. Grade I DD 01/07/2016 Echo EF 55-60% GLS -21.2% Lateral s' 10.2 cm/s Mild MR/TR Grade I DD   Labs (1/15): K 3.9, creatinine 1.0 Labs  (7/15) K 4.2 Cr 1.2  Past Medical History:  Diagnosis Date  . Anemia    hx of  . Anxiety    r/t updated surgery  . Breast cancer ([place])    right  . Cataract    immature-not sure of which eye  . Chronic kidney disease   . Complication of anesthesia    pt states she is sensitive to meds  . Dizziness    has  been going on for 67month;medical MD aware  . GERD (gastroesophageal reflux disease)    Tums prn  . H. pylori infection   . H/O hiatal hernia   . Hemorrhoids   . History of UTI   . Hx of radiation therapy 10/25/12- 12/11/12   right chest wall/regional lymph nodes 5040 cGy, 28 sessions, right chest wall boost 1000 cGy 1 session  . Hx: UTI (urinary tract infection)   . Hyperlipidemia    but not on meds;diet  and exercise controlled  . Hypertension    recently started Aldactone   . Insomnia    takes Melatoniin daily  . Metastasis to spinal column ([facility])    T5  . PONV (postoperative nausea and vomiting)    pt experienced hair loss, confusion and combative- 02/15/15  . Right knee pain   . Right shoulder pain   . Skin cancer   . Urinary frequency    d/t taking Aldactone    Current Outpatient Prescriptions  Medication Sig Dispense Refill  . aspirin 81 MG tablet Take 81 mg by mouth daily.    .Marland Kitchenb complex vitamins tablet Take 1 tablet by mouth daily.    . Calcium Carbonate Antacid (TUMS PO) Take 2 tablets by mouth 2 (two) times daily as needed (acid reflux).    . Cholecalciferol (VITAMIN D) 2000 UNITS tablet Take 2,000 Units by mouth daily.    . Coenzyme Q10 (COQ10) 100 MG CAPS Take 100 mg by mouth at bedtime.    . gabapentin (NEURONTIN) 600 MG tablet Take 1 tablet (600 mg total) by mouth 3 (three) times daily. 270 tablet 2  . letrozole (FEMARA) 2.5 MG tablet Take 1 tablet (2.5 mg total) by mouth daily. 90 tablet 1  . lidocaine-prilocaine (EMLA) cream Apply to affected area once (Patient taking differently: Apply 1 application topically every 21 ( twenty-one) days. Apply to port prior to infusions) 30 g 3  . OVER THE COUNTER MEDICATION Place 1 drop into both eyes 2 (two) times daily as needed (dry eyes). Over the counter lubricating eye drops    . polyethylene glycol (MIRALAX / GLYCOLAX) packet Take 17 g by mouth 2 (two) times daily. 100 each 0  . Probiotic Product (PROBIOTIC DAILY PO) Take 1 capsule by mouth 2 (two) times daily.     . prochlorperazine (COMPAZINE) 10 MG tablet Take 1 tablet (10 mg total) by mouth every 6 (six) hours as needed for nausea or vomiting. 30 tablet 0  . ranitidine (ZANTAC) 300 MG tablet Take 300 mg by mouth at bedtime. Reported on 06/25/2015    . spironolactone (ALDACTONE) 25 MG tablet Take 0.5 tablets (12.5 mg total) by mouth daily. 45 tablet 12  . traMADol (ULTRAM) 50  MG tablet Take 1 tablet (50 mg total) by mouth every 12 (twelve) hours as needed for moderate pain. 30 tablet 0  . Wound Cleansers (RADIAPLEX EX) Apply topically. Reported on 06/25/2015    . methocarbamol (ROBAXIN) 500 MG tablet Take 1 tablet (500 mg total) by mouth daily as needed for muscle spasms. (Patient not taking: Reported on 01/07/2016) 75 tablet 0   No current facility-administered medications for this encounter.      Allergies  Allergen Reactions  . Codeine     " loopy".   02/26/15: Also patient does not want to take oxycodone either (makes her feel like a "zombie")  . Keflex [Cephalexin]     GI Upset  . Naprosyn [Naproxen] Other (See Comments)    GI  ipset   . Oxycodone Other (See Comments)    Made her feel like a "zombie" 02/25/15. Patient does not want to take oxycodone.  Cecil Cranker [Guaifenesin] Other (See Comments)    Dizzy     Social History   Social History  . Marital status: Married    Spouse name: N/A  . Number of children: 2  . Years of education: N/A   Occupational History  . Not on file.   Social History Main Topics  . Smoking status: Never Smoker  . Smokeless tobacco: Never Used  . Alcohol use No  . Drug use: No  . Sexual activity: Yes    Birth control/ protection: Post-menopausal   Other Topics Concern  . Not on file   Social History Narrative  . No narrative on file    Family History  Problem Relation Age of Onset  . Leukemia Mother   . Colon cancer Brother   . Cancer Brother     Prostate  . Colon cancer Maternal Grandmother   . Stroke Father   . Diabetes Mellitus II Father     PHYSICAL EXAM: Vitals:   01/07/16 1105  BP: (!) 146/84  Pulse: 76  Resp: (!) 99   General:  Well appearing. No respiratory difficulty. Sitting in WC HEENT: normal Neck: supple. no JVD. Carotids 2+ bilat; no bruits. No lymphadenopathy or thryomegaly appreciated. Cor: PMI nondisplaced. Regular rate & rhythm. No rubs, gallops or murmurs. L  port-a-cath Lungs: clear Abdomen: soft, nontender, nondistended. No hepatosplenomegaly. No bruits or masses. Good bowel sounds. Extremities: no cyanosis, clubbing, rash, no lower extremity edema. Braces on legs.  Neuro: alert & oriented x 3, cranial nerves grossly intact. moves all 4 extremities w/o difficulty. Affect pleasant.   ASSESSMENT & PLAN: 1. R breast CA with bone mets -initial Herceptin completed in 2015. -pathological bone fracture 2016. Palliative Herceptin start 12/16. - I reviewed the echo today.  EF and doppler parameters stable.  No CHF on exam.  - Will repeat echo in 4 months. . If therapy extends beyond that and no signs of cardio-toxicity will extend screening interval to every 6 months, 2. HTN: BP stable, continue current regimen.  --BP here is high. Says it is lower at home. Will have her take her BP cuff to cancer center to calibrate. Goal is to keep SBP under 160.   Bensimhon, Daniel,MD 11:34 AM

## 2016-01-07 NOTE — Progress Notes (Signed)
  Echocardiogram 2D Echocardiogram has been performed.  Diamond Nickel 01/07/2016, 10:56 AM

## 2016-01-07 NOTE — Patient Instructions (Signed)
[facility] Discharge Instructions for Patients Receiving Chemotherapy  Today you received the following chemotherapy agents herceptin   To help prevent nausea and vomiting after your treatment, we encourage you to take your nausea medication as directed   If you develop nausea and vomiting that is not controlled by your nausea medication, call the clinic.   BELOW ARE SYMPTOMS THAT SHOULD BE REPORTED IMMEDIATELY:  *FEVER GREATER THAN 100.5 F  *CHILLS WITH OR WITHOUT FEVER  NAUSEA AND VOMITING THAT IS NOT CONTROLLED WITH YOUR NAUSEA MEDICATION  *UNUSUAL SHORTNESS OF BREATH  *UNUSUAL BRUISING OR BLEEDING  TENDERNESS IN MOUTH AND THROAT WITH OR WITHOUT PRESENCE OF ULCERS  *URINARY PROBLEMS  *BOWEL PROBLEMS  UNUSUAL RASH Items with * indicate a potential emergency and should be followed up as soon as possible.  Feel free to call the clinic you have any questions or concerns. The clinic phone number is [phone].  

## 2016-01-07 NOTE — Addendum Note (Signed)
Encounter addended by: Scarlette Calico, RN on: 01/07/2016 11:51 AM<BR>    Actions taken: Order Entry activity accessed, Diagnosis association updated

## 2016-01-07 NOTE — Progress Notes (Signed)
Patient Care Team: [name], MD as PCP - General (Family Medicine)  DIAGNOSIS: Primary cancer of upper outer quadrant of right female breast [facility])   Staging form: Breast, AJCC 7th Edition   - Clinical: Stage IIB (T2, N1, cM0) - Unsigned         Staging comments: Staged at breast conference 11.6.13    - Pathologic: No stage assigned - Unsigned  SUMMARY OF ONCOLOGIC HISTORY:   Primary cancer of upper outer quadrant of right female breast ([place])   03/08/2012 Initial Diagnosis    Right breast invasive ductal carcinoma ER positive PR negative HER-2 positive Ki-67 44%; another breast mass biopsied in the anterior part of the breast which was also positive for malignancy that was HER-2 negative      05/10/2012 Surgery    Right mastectomy and axillary lymph node dissection: Multifocal disease 5 cm, 1.7 cm, 1.6 cm, ER positive PR negative HER-2 positive Ki-67 44%, 3/7 lymph nodes positive      06/14/2012 - 06/12/2013 Chemotherapy    Adjuvant chemotherapy with [place] 6 followed by Herceptin maintenance      10/25/2012 - 12/11/2012 Radiation Therapy    Adjuvant radiation therapy      02/07/2013 -  Anti-estrogen oral therapy    Letrozole 2.5 mg daily      02/14/2015 Imaging    MRI spine: Large destructive T5 lesion with severe pathologic compression fracture and extensive epidural tumor, severe spinal stenosis with moderate cord compression, tumor involvement of T4      03/18/2015 PET scan    Residual enhancing soft tissue adjacent to the spinal cord. No evidence of metastatic disease. Nonspecific uptake the left nipple      03/27/2015 - 03/30/2015 Hospital Admission    T4-6 decompression for spinal cord compression (lower extremity paralysis)      04/10/2015 -  Chemotherapy    Palliative treatment with Herceptin every 3 weeks with letrozole 2.5 mg daily      04/13/2015 - 05/19/2015 Radiation Therapy    Palliative radiation treatment to the spine      09/01/2015 Imaging    CT chest  abdomen pelvis: Pathologic fracture with posterior fusion at T5 no other evidence of metastatic disease in the chest abdomen pelvis      12/23/2015 Imaging    CT CAP: new nonspecific 0.6 cm lymph node was stated mediastinum needs follow-up CT, innumerable tiny groundglass pulmonary nodules throughout both lungs unchanged, patchy consolidation from radiation, T5 fracture, no mets       CHIEF COMPLIANT: Follow-up of metastatic breast cancer on Herceptin and letrozole  INTERVAL HISTORY: Heather [name] is a 70 year old with above-mentioned history of metastatic breast cancer who is currently receiving Herceptin maintenance along with letrozole. She appears to be tolerated the treatment extremely well. She is now able to walk with the help of a cane. Recent CT scans revealed a para-aortic lymph node that is enlarged and new. She does not have any symptoms related to this. She is concerned slightly about the hair loss.  REVIEW OF SYSTEMS:   Constitutional: Denies fevers, chills or abnormal weight loss Eyes: Denies blurriness of vision Ears, nose, mouth, throat, and face: Denies mucositis or sore throat Respiratory: Denies cough, dyspnea or wheezes Cardiovascular: Denies palpitation, chest discomfort Gastrointestinal:  Denies nausea, heartburn or change in bowel habits Skin: Denies abnormal skin rashes Lymphatics: Denies new lymphadenopathy or easy bruising Neurological:Denies numbness, tingling or new weaknesses Behavioral/Psych: Mood is stable, no new changes  Extremities: No lower  extremity edema Breast:  denies any pain or lumps or nodules in either breasts Musculoskeletal: Complaining of left shoulder pain All other systems were reviewed with the patient and are negative.  I have reviewed the past medical history, past surgical history, social history and family history with the patient and they are unchanged from previous note.  ALLERGIES:  is allergic to codeine; keflex [cephalexin];  naprosyn [naproxen]; oxycodone; and tussin [guaifenesin].  MEDICATIONS:  Current Outpatient Prescriptions  Medication Sig Dispense Refill  . aspirin 81 MG tablet Take 81 mg by mouth daily.    Marland Kitchen b complex vitamins tablet Take 1 tablet by mouth daily.    . Calcium Carbonate Antacid (TUMS PO) Take 2 tablets by mouth 2 (two) times daily as needed (acid reflux).    . Cholecalciferol (VITAMIN D) 2000 UNITS tablet Take 2,000 Units by mouth daily.    . Coenzyme Q10 (COQ10) 100 MG CAPS Take 100 mg by mouth at bedtime.    . gabapentin (NEURONTIN) 600 MG tablet Take 1 tablet (600 mg total) by mouth 3 (three) times daily. 270 tablet 2  . letrozole (FEMARA) 2.5 MG tablet Take 1 tablet (2.5 mg total) by mouth daily. 90 tablet 1  . lidocaine-prilocaine (EMLA) cream Apply to affected area once (Patient taking differently: Apply 1 application topically every 21 ( twenty-one) days. Apply to port prior to infusions) 30 g 3  . methocarbamol (ROBAXIN) 500 MG tablet Take 1 tablet (500 mg total) by mouth daily as needed for muscle spasms. (Patient not taking: Reported on 01/07/2016) 75 tablet 0  . OVER THE COUNTER MEDICATION Place 1 drop into both eyes 2 (two) times daily as needed (dry eyes). Over the counter lubricating eye drops    . polyethylene glycol (MIRALAX / GLYCOLAX) packet Take 17 g by mouth 2 (two) times daily. 100 each 0  . Probiotic Product (PROBIOTIC DAILY PO) Take 1 capsule by mouth 2 (two) times daily.     . prochlorperazine (COMPAZINE) 10 MG tablet Take 1 tablet (10 mg total) by mouth every 6 (six) hours as needed for nausea or vomiting. 30 tablet 0  . ranitidine (ZANTAC) 300 MG tablet Take 300 mg by mouth at bedtime. Reported on 06/25/2015    . spironolactone (ALDACTONE) 25 MG tablet Take 0.5 tablets (12.5 mg total) by mouth daily. 45 tablet 12  . traMADol (ULTRAM) 50 MG tablet Take 1 tablet (50 mg total) by mouth every 12 (twelve) hours as needed for moderate pain. 30 tablet 0  . Wound Cleansers  (RADIAPLEX EX) Apply topically. Reported on 06/25/2015     No current facility-administered medications for this visit.     PHYSICAL EXAMINATION: ECOG PERFORMANCE STATUS: 1 - Symptomatic but completely ambulatory  Vitals:   01/07/16 1328  BP: (!) 145/71  Pulse: 80  Resp: 18  Temp: 98.5 F (36.9 C)   Filed Weights   01/07/16 1328  Weight: 136 lb 3.2 oz (61.8 kg)    GENERAL:alert, no distress and comfortable SKIN: skin color, texture, turgor are normal, no rashes or significant lesions EYES: normal, Conjunctiva are pink and non-injected, sclera clear OROPHARYNX:no exudate, no erythema and lips, buccal mucosa, and tongue normal  NECK: supple, thyroid normal size, non-tender, without nodularity LYMPH:  no palpable lymphadenopathy in the cervical, axillary or inguinal LUNGS: clear to auscultation and percussion with normal breathing effort HEART: regular rate & rhythm and no murmurs and no lower extremity edema ABDOMEN:abdomen soft, non-tender and normal bowel sounds MUSCULOSKELETAL:no cyanosis of digits and  no clubbing  NEURO: Able to walk with the help of a cane. EXTREMITIES: Left shoulder pain from possibly rotator cuff injury   LABORATORY DATA:  I have reviewed the data as listed   Chemistry      Component Value Date/Time   NA 135 (L) 11/26/2015 0925   K 4.0 11/26/2015 0925   CL 99 (L) 03/28/2015 0446   CL 101 10/25/2012 0946   CO2 25 11/26/2015 0925   BUN 12.5 11/26/2015 0925   CREATININE 0.8 11/26/2015 0925      Component Value Date/Time   CALCIUM 9.9 11/26/2015 0925   ALKPHOS 80 11/26/2015 0925   AST 23 11/26/2015 0925   ALT 21 11/26/2015 0925   BILITOT 0.63 11/26/2015 0925       Lab Results  Component Value Date   WBC 5.3 01/07/2016   HGB 12.8 01/07/2016   HCT 38.9 01/07/2016   MCV 82.9 01/07/2016   PLT 192 01/07/2016   NEUTROABS 4.1 01/07/2016     ASSESSMENT & PLAN:  Primary cancer of upper outer quadrant of right female breast ([place]) Right  breast invasive ductal carcinoma ER positive PR negative HER-2 positive Ki-67 44% multifocal disease 3/7 lymph nodes positive T2 N1 M0 stage IIB status post adjuvant chemotherapy with TCH followed by Herceptin maintenance, adjuvant radiation therapy, currently on letrozole since 02/07/2013. MRI 02/14/2015: T5 large destructive lesion with pathologic compression fracture and extensive epidural tumor, involvement of T4, excision metastatic carcinoma ER 60%, PR 0%, HER-2 positive ratio 2.71 average copy #7.45 03/27/2015: T4-6 decompression surgery for spinal cord compression Radiation therapy to the spine (Dr. [name]) 04/13/2015 to 05/19/2015  Treatment plan: 1. Resumed anastrozole 05/21/2015, but it has caused significant hair loss so we switched her to exemestane 25 mg once daily 07/23/2015 2. Herceptin every 3 week started 04/10/2015 3. Obtain echocardiograms every 3 months 4. Bone metastases: Zometa every 3 months. (Change made because of diffuse bone pain related to Zometa infusions) -------------------------------------------------------------------------------------------------------------------------- Goal of treatment: Palliation Echocardiogram 10/07/2015: EF 55-60% (stable from 07/2015 but overall down from 65-70%)   Zometa toxicities: bone pain and fatigue. If it causes persistent symptoms,  Zometa every 3 months.  Hypoproteinemia: patient trying to increase her dietary protein And her albumin level significant increase of 3.4 Patient is now able to walk with the help of crutches. This is a huge improvement from where she started.  Anemia of chronic disease:Hemoglobin is slowly improving. Today's hemoglobin is 12.8  CT chest abdomen pelvis 09/01/2015: Pathologic fracture with posterior fusion at T5 no other evidence of metastatic disease in the chest abdomen pelvis  CT CAP 12/23/2015: new nonspecific 0.6 cm lymph node was stated mediastinum needs follow-up CT, innumerable tiny  groundglass pulmonary nodules throughout both lungs unchanged, patchy consolidation from radiation, T5 fracture, no mets Plan to obtain a CT chest in 3 months and follow-up. Return to clinic every 3 weeks for Herceptin infusions and every 9 weeks for clinic follow-up.   No orders of the defined types were placed in this encounter.  The patient has a good understanding of the overall plan. she agrees with it. she will call with any problems that may develop before the next visit here.   Rulon Eisenmenger, MD 01/07/16

## 2016-01-10 ENCOUNTER — Telehealth: Payer: Self-pay | Admitting: Hematology and Oncology

## 2016-01-10 NOTE — Telephone Encounter (Signed)
S/w pt, gave appt for 9/21 @ 9am and asked her to pick up a calendar then. Also advised pt of ct ordered for Nov. Advised radiology will call her to schedule.

## 2016-01-22 DIAGNOSIS — E785 Hyperlipidemia, unspecified: Secondary | ICD-10-CM | POA: Diagnosis not present

## 2016-01-27 ENCOUNTER — Encounter: Payer: Self-pay | Admitting: Physical Medicine & Rehabilitation

## 2016-01-27 ENCOUNTER — Encounter: Payer: Medicare Other | Attending: Physical Medicine & Rehabilitation | Admitting: Physical Medicine & Rehabilitation

## 2016-01-27 VITALS — BP 137/84 | HR 72 | Resp 14

## 2016-01-27 DIAGNOSIS — D649 Anemia, unspecified: Secondary | ICD-10-CM | POA: Insufficient documentation

## 2016-01-27 DIAGNOSIS — R350 Frequency of micturition: Secondary | ICD-10-CM | POA: Insufficient documentation

## 2016-01-27 DIAGNOSIS — M4854XA Collapsed vertebra, not elsewhere classified, thoracic region, initial encounter for fracture: Secondary | ICD-10-CM | POA: Insufficient documentation

## 2016-01-27 DIAGNOSIS — G9520 Unspecified cord compression: Secondary | ICD-10-CM

## 2016-01-27 DIAGNOSIS — R531 Weakness: Secondary | ICD-10-CM | POA: Diagnosis not present

## 2016-01-27 DIAGNOSIS — K219 Gastro-esophageal reflux disease without esophagitis: Secondary | ICD-10-CM | POA: Insufficient documentation

## 2016-01-27 DIAGNOSIS — G4700 Insomnia, unspecified: Secondary | ICD-10-CM | POA: Insufficient documentation

## 2016-01-27 DIAGNOSIS — G9529 Other cord compression: Secondary | ICD-10-CM

## 2016-01-27 DIAGNOSIS — N189 Chronic kidney disease, unspecified: Secondary | ICD-10-CM | POA: Diagnosis not present

## 2016-01-27 DIAGNOSIS — M25512 Pain in left shoulder: Secondary | ICD-10-CM | POA: Diagnosis not present

## 2016-01-27 DIAGNOSIS — K449 Diaphragmatic hernia without obstruction or gangrene: Secondary | ICD-10-CM | POA: Diagnosis not present

## 2016-01-27 DIAGNOSIS — N319 Neuromuscular dysfunction of bladder, unspecified: Secondary | ICD-10-CM | POA: Insufficient documentation

## 2016-01-27 DIAGNOSIS — Z853 Personal history of malignant neoplasm of breast: Secondary | ICD-10-CM | POA: Insufficient documentation

## 2016-01-27 DIAGNOSIS — K592 Neurogenic bowel, not elsewhere classified: Secondary | ICD-10-CM

## 2016-01-27 DIAGNOSIS — Z87440 Personal history of urinary (tract) infections: Secondary | ICD-10-CM | POA: Insufficient documentation

## 2016-01-27 DIAGNOSIS — I129 Hypertensive chronic kidney disease with stage 1 through stage 4 chronic kidney disease, or unspecified chronic kidney disease: Secondary | ICD-10-CM | POA: Insufficient documentation

## 2016-01-27 DIAGNOSIS — C7951 Secondary malignant neoplasm of bone: Secondary | ICD-10-CM | POA: Diagnosis not present

## 2016-01-27 DIAGNOSIS — C50919 Malignant neoplasm of unspecified site of unspecified female breast: Secondary | ICD-10-CM | POA: Diagnosis not present

## 2016-01-27 DIAGNOSIS — M5104 Intervertebral disc disorders with myelopathy, thoracic region: Secondary | ICD-10-CM | POA: Insufficient documentation

## 2016-01-27 DIAGNOSIS — Z85828 Personal history of other malignant neoplasm of skin: Secondary | ICD-10-CM | POA: Insufficient documentation

## 2016-01-27 DIAGNOSIS — E785 Hyperlipidemia, unspecified: Secondary | ICD-10-CM | POA: Diagnosis not present

## 2016-01-27 DIAGNOSIS — G839 Paralytic syndrome, unspecified: Secondary | ICD-10-CM

## 2016-01-27 DIAGNOSIS — R42 Dizziness and giddiness: Secondary | ICD-10-CM | POA: Insufficient documentation

## 2016-01-27 DIAGNOSIS — G9619 Other disorders of meninges, not elsewhere classified: Secondary | ICD-10-CM

## 2016-01-27 DIAGNOSIS — M792 Neuralgia and neuritis, unspecified: Secondary | ICD-10-CM | POA: Insufficient documentation

## 2016-01-27 DIAGNOSIS — F419 Anxiety disorder, unspecified: Secondary | ICD-10-CM | POA: Diagnosis not present

## 2016-01-27 DIAGNOSIS — G8220 Paraplegia, unspecified: Secondary | ICD-10-CM

## 2016-01-27 DIAGNOSIS — G96198 Other disorders of meninges, not elsewhere classified: Secondary | ICD-10-CM

## 2016-01-27 MED ORDER — GABAPENTIN 600 MG PO TABS: 600.0000 mg | ORAL_TABLET | ORAL | 2 refills | Status: DC

## 2016-01-27 NOTE — Patient Instructions (Addendum)
PLEASE CALL ME WITH ANY PROBLEMS OR QUESTIONS [phone])    Impingement Syndrome, Rotator Cuff, Bursitis With Rehab Impingement syndrome is a condition that involves inflammation of the tendons of the rotator cuff and the subacromial bursa, that causes pain in the shoulder. The rotator cuff consists of four tendons and muscles that control much of the shoulder and upper arm function. The subacromial bursa is a fluid filled sac that helps reduce friction between the rotator cuff and one of the bones of the shoulder (acromion). Impingement syndrome is usually an overuse injury that causes swelling of the bursa (bursitis), swelling of the tendon (tendonitis), and/or a tear of the tendon (strain). Strains are classified into three categories. Grade 1 strains cause pain, but the tendon is not lengthened. Grade 2 strains include a lengthened ligament, due to the ligament being stretched or partially ruptured. With grade 2 strains there is still function, although the function may be decreased. Grade 3 strains include a complete tear of the tendon or muscle, and function is usually impaired. SYMPTOMS   Pain around the shoulder, often at the outer portion of the upper arm.  Pain that gets worse with shoulder function, especially when reaching overhead or lifting.  Sometimes, aching when not using the arm.  Pain that wakes you up at night.  Sometimes, tenderness, swelling, warmth, or redness over the affected area.  Loss of strength.  Limited motion of the shoulder, especially reaching behind the back (to the back pocket or to unhook bra) or across your body.  Crackling sound (crepitation) when moving the arm.  Biceps tendon pain and inflammation (in the front of the shoulder). Worse when bending the elbow or lifting. CAUSES  Impingement syndrome is often an overuse injury, in which chronic (repetitive) motions cause the tendons or bursa to become inflamed. A strain occurs when a force is paced  on the tendon or muscle that is greater than it can withstand. Common mechanisms of injury include: Stress from sudden increase in duration, frequency, or intensity of training.  Direct hit (trauma) to the shoulder.  Aging, erosion of the tendon with normal use.  Bony bump on shoulder (acromial spur). RISK INCREASES WITH:  Contact sports (football, wrestling, boxing).  Throwing sports (baseball, tennis, volleyball).  Weightlifting and bodybuilding.  Heavy labor.  Previous injury to the rotator cuff, including impingement.  Poor shoulder strength and flexibility.  Failure to warm up properly before activity.  Inadequate protective equipment.  Old age.  Bony bump on shoulder (acromial spur). PREVENTION   Warm up and stretch properly before activity.  Allow for adequate recovery between workouts.  Maintain physical fitness:  Strength, flexibility, and endurance.  Cardiovascular fitness.  Learn and use proper exercise technique. PROGNOSIS  If treated properly, impingement syndrome usually goes away within 6 weeks. Sometimes surgery is required.  RELATED COMPLICATIONS   Longer healing time if not properly treated, or if not given enough time to heal.  Recurring symptoms, that result in a chronic condition.  Shoulder stiffness, frozen shoulder, or loss of motion.  Rotator cuff tendon tear.  Recurring symptoms, especially if activity is resumed too soon, with overuse, with a direct blow, or when using poor technique. TREATMENT  Treatment first involves the use of ice and medicine, to reduce pain and inflammation. The use of strengthening and stretching exercises may help reduce pain with activity. These exercises may be performed at home or with a therapist. If non-surgical treatment is unsuccessful after more than 6 months, surgery may be  advised. After surgery and rehabilitation, activity is usually possible in 3 months.  MEDICATION  If pain medicine is needed,  nonsteroidal anti-inflammatory medicines (aspirin and ibuprofen), or other minor pain relievers (acetaminophen), are often advised.  Do not take pain medicine for 7 days before surgery.  Prescription pain relievers may be given, if your caregiver thinks they are needed. Use only as directed and only as much as you need.  Corticosteroid injections may be given by your caregiver. These injections should be reserved for the most serious cases, because they may only be given a certain number of times. HEAT AND COLD  Cold treatment (icing) should be applied for 10 to 15 minutes every 2 to 3 hours for inflammation and pain, and immediately after activity that aggravates your symptoms. Use ice packs or an ice massage.  Heat treatment may be used before performing stretching and strengthening activities prescribed by your caregiver, physical therapist, or athletic trainer. Use a heat pack or a warm water soak. SEEK MEDICAL CARE IF:   Symptoms get worse or do not improve in 4 to 6 weeks, despite treatment.  New, unexplained symptoms develop. (Drugs used in treatment may produce side effects.) EXERCISES  RANGE OF MOTION (ROM) AND STRETCHING EXERCISES - Impingement Syndrome (Rotator Cuff  Tendinitis, Bursitis) These exercises may help you when beginning to rehabilitate your injury. Your symptoms may go away with or without further involvement from your physician, physical therapist or athletic trainer. While completing these exercises, remember:   Restoring tissue flexibility helps normal motion to return to the joints. This allows healthier, less painful movement and activity.  An effective stretch should be held for at least 30 seconds.  A stretch should never be painful. You should only feel a gentle lengthening or release in the stretched tissue. STRETCH - Flexion, Standing  Stand with good posture. With an underhand grip on your right / left hand, and an overhand grip on the opposite hand,  grasp a broomstick or cane so that your hands are a little more than shoulder width apart.  Keeping your right / left elbow straight and shoulder muscles relaxed, push the stick with your opposite hand, to raise your right / left arm in front of your body and then overhead. Raise your arm until you feel a stretch in your right / left shoulder, but before you have increased shoulder pain.  Try to avoid shrugging your right / left shoulder as your arm rises, by keeping your shoulder blade tucked down and toward your mid-back spine. Hold for __________ seconds.  Slowly return to the starting position. Repeat __________ times. Complete this exercise __________ times per day. STRETCH - Abduction, Supine  Lie on your back. With an underhand grip on your right / left hand and an overhand grip on the opposite hand, grasp a broomstick or cane so that your hands are a little more than shoulder width apart.  Keeping your right / left elbow straight and your shoulder muscles relaxed, push the stick with your opposite hand, to raise your right / left arm out to the side of your body and then overhead. Raise your arm until you feel a stretch in your right / left shoulder, but before you have increased shoulder pain.  Try to avoid shrugging your right / left shoulder as your arm rises, by keeping your shoulder blade tucked down and toward your mid-back spine. Hold for __________ seconds.  Slowly return to the starting position. Repeat __________ times. Complete this exercise  __________ times per day. ROM - Flexion, Active-Assisted  Lie on your back. You may bend your knees for comfort.  Grasp a broomstick or cane so your hands are about shoulder width apart. Your right / left hand should grip the end of the stick, so that your hand is positioned "thumbs-up," as if you were about to shake hands.  Using your healthy arm to lead, raise your right / left arm overhead, until you feel a gentle stretch in your  shoulder. Hold for __________ seconds.  Use the stick to assist in returning your right / left arm to its starting position. Repeat __________ times. Complete this exercise __________ times per day.  ROM - Internal Rotation, Supine   Lie on your back on a firm surface. Place your right / left elbow about 60 degrees away from your side. Elevate your elbow with a folded towel, so that the elbow and shoulder are the same height.  Using a broomstick or cane and your strong arm, pull your right / left hand toward your body until you feel a gentle stretch, but no increase in your shoulder pain. Keep your shoulder and elbow in place throughout the exercise.  Hold for __________ seconds. Slowly return to the starting position. Repeat __________ times. Complete this exercise __________ times per day. STRETCH - Internal Rotation  Place your right / left hand behind your back, palm up.  Throw a towel or belt over your opposite shoulder. Grasp the towel with your right / left hand.  While keeping an upright posture, gently pull up on the towel, until you feel a stretch in the front of your right / left shoulder.  Avoid shrugging your right / left shoulder as your arm rises, by keeping your shoulder blade tucked down and toward your mid-back spine.  Hold for __________ seconds. Release the stretch, by lowering your healthy hand. Repeat __________ times. Complete this exercise __________ times per day. ROM - Internal Rotation   Using an underhand grip, grasp a stick behind your back with both hands.  While standing upright with good posture, slide the stick up your back until you feel a mild stretch in the front of your shoulder.  Hold for __________ seconds. Slowly return to your starting position. Repeat __________ times. Complete this exercise __________ times per day.  STRETCH - Posterior Shoulder Capsule   Stand or sit with good posture. Grasp your right / left elbow and draw it across your  chest, keeping it at the same height as your shoulder.  Pull your elbow, so your upper arm comes in closer to your chest. Pull until you feel a gentle stretch in the back of your shoulder.  Hold for __________ seconds. Repeat __________ times. Complete this exercise __________ times per day. STRENGTHENING EXERCISES - Impingement Syndrome (Rotator Cuff Tendinitis, Bursitis) These exercises may help you when beginning to rehabilitate your injury. They may resolve your symptoms with or without further involvement from your physician, physical therapist or athletic trainer. While completing these exercises, remember:  Muscles can gain both the endurance and the strength needed for everyday activities through controlled exercises.  Complete these exercises as instructed by your physician, physical therapist or athletic trainer. Increase the resistance and repetitions only as guided.  You may experience muscle soreness or fatigue, but the pain or discomfort you are trying to eliminate should never worsen during these exercises. If this pain does get worse, stop and make sure you are following the directions exactly. If the  pain is still present after adjustments, discontinue the exercise until you can discuss the trouble with your clinician.  During your recovery, avoid activity or exercises which involve actions that place your injured hand or elbow above your head or behind your back or head. These positions stress the tissues which you are trying to heal. STRENGTH - Scapular Depression and Adduction   With good posture, sit on a firm chair. Support your arms in front of you, with pillows, arm rests, or on a table top. Have your elbows in line with the sides of your body.  Gently draw your shoulder blades down and toward your mid-back spine. Gradually increase the tension, without tensing the muscles along the top of your shoulders and the back of your neck.  Hold for __________ seconds. Slowly  release the tension and relax your muscles completely before starting the next repetition.  After you have practiced this exercise, remove the arm support and complete the exercise in standing as well as sitting position. Repeat __________ times. Complete this exercise __________ times per day.  STRENGTH - Shoulder Abductors, Isometric  With good posture, stand or sit about 4-6 inches from a wall, with your right / left side facing the wall.  Bend your right / left elbow. Gently press your right / left elbow into the wall. Increase the pressure gradually, until you are pressing as hard as you can, without shrugging your shoulder or increasing any shoulder discomfort.  Hold for __________ seconds.  Release the tension slowly. Relax your shoulder muscles completely before you begin the next repetition. Repeat __________ times. Complete this exercise __________ times per day.  STRENGTH - External Rotators, Isometric  Keep your right / left elbow at your side and bend it 90 degrees.  Step into a door frame so that the outside of your right / left wrist can press against the door frame without your upper arm leaving your side.  Gently press your right / left wrist into the door frame, as if you were trying to swing the back of your hand away from your stomach. Gradually increase the tension, until you are pressing as hard as you can, without shrugging your shoulder or increasing any shoulder discomfort.  Hold for __________ seconds.  Release the tension slowly. Relax your shoulder muscles completely before you begin the next repetition. Repeat __________ times. Complete this exercise __________ times per day.  STRENGTH - Supraspinatus   Stand or sit with good posture. Grasp a __________ weight, or an exercise band or tubing, so that your hand is "thumbs-up," like you are shaking hands.  Slowly lift your right / left arm in a "V" away from your thigh, diagonally into the space between your  side and straight ahead. Lift your hand to shoulder height or as far as you can, without increasing any shoulder pain. At first, many people do not lift their hands above shoulder height.  Avoid shrugging your right / left shoulder as your arm rises, by keeping your shoulder blade tucked down and toward your mid-back spine.  Hold for __________ seconds. Control the descent of your hand, as you slowly return to your starting position. Repeat __________ times. Complete this exercise __________ times per day.  STRENGTH - External Rotators  Secure a rubber exercise band or tubing to a fixed object (table, pole) so that it is at the same height as your right / left elbow when you are standing or sitting on a firm surface.  Stand or sit  so that the secured exercise band is at your uninjured side.  Bend your right / left elbow 90 degrees. Place a folded towel or small pillow under your right / left arm, so that your elbow is a few inches away from your side.  Keeping the tension on the exercise band, pull it away from your body, as if pivoting on your elbow. Be sure to keep your body steady, so that the movement is coming only from your rotating shoulder.  Hold for __________ seconds. Release the tension in a controlled manner, as you return to the starting position. Repeat __________ times. Complete this exercise __________ times per day.  STRENGTH - Internal Rotators   Secure a rubber exercise band or tubing to a fixed object (table, pole) so that it is at the same height as your right / left elbow when you are standing or sitting on a firm surface.  Stand or sit so that the secured exercise band is at your right / left side.  Bend your elbow 90 degrees. Place a folded towel or small pillow under your right / left arm so that your elbow is a few inches away from your side.  Keeping the tension on the exercise band, pull it across your body, toward your stomach. Be sure to keep your body steady,  so that the movement is coming only from your rotating shoulder.  Hold for __________ seconds. Release the tension in a controlled manner, as you return to the starting position. Repeat __________ times. Complete this exercise __________ times per day.  STRENGTH - Scapular Protractors, Standing   Stand arms length away from a wall. Place your hands on the wall, keeping your elbows straight.  Begin by dropping your shoulder blades down and toward your mid-back spine.  To strengthen your protractors, keep your shoulder blades down, but slide them forward on your rib cage. It will feel as if you are lifting the back of your rib cage away from the wall. This is a subtle motion and can be challenging to complete. Ask your caregiver for further instruction, if you are not sure you are doing the exercise correctly.  Hold for __________ seconds. Slowly return to the starting position, resting the muscles completely before starting the next repetition. Repeat __________ times. Complete this exercise __________ times per day. STRENGTH - Scapular Protractors, Supine  Lie on your back on a firm surface. Extend your right / left arm straight into the air while holding a __________ weight in your hand.  Keeping your head and back in place, lift your shoulder off the floor.  Hold for __________ seconds. Slowly return to the starting position, and allow your muscles to relax completely before starting the next repetition. Repeat __________ times. Complete this exercise __________ times per day. STRENGTH - Scapular Protractors, Quadruped  Get onto your hands and knees, with your shoulders directly over your hands (or as close as you can be, comfortably).  Keeping your elbows locked, lift the back of your rib cage up into your shoulder blades, so your mid-back rounds out. Keep your neck muscles relaxed.  Hold this position for __________ seconds. Slowly return to the starting position and allow your  muscles to relax completely before starting the next repetition. Repeat __________ times. Complete this exercise __________ times per day.  STRENGTH - Scapular Retractors  Secure a rubber exercise band or tubing to a fixed object (table, pole), so that it is at the height of your shoulders when you  are either standing, or sitting on a firm armless chair.  With a palm down grip, grasp an end of the band in each hand. Straighten your elbows and lift your hands straight in front of you, at shoulder height. Step back, away from the secured end of the band, until it becomes tense.  Squeezing your shoulder blades together, draw your elbows back toward your sides, as you bend them. Keep your upper arms lifted away from your body throughout the exercise.  Hold for __________ seconds. Slowly ease the tension on the band, as you reverse the directions and return to the starting position. Repeat __________ times. Complete this exercise __________ times per day. STRENGTH - Shoulder Extensors   Secure a rubber exercise band or tubing to a fixed object (table, pole) so that it is at the height of your shoulders when you are either standing, or sitting on a firm armless chair.  With a thumbs-up grip, grasp an end of the band in each hand. Straighten your elbows and lift your hands straight in front of you, at shoulder height. Step back, away from the secured end of the band, until it becomes tense.  Squeezing your shoulder blades together, pull your hands down to the sides of your thighs. Do not allow your hands to go behind you.  Hold for __________ seconds. Slowly ease the tension on the band, as you reverse the directions and return to the starting position. Repeat __________ times. Complete this exercise __________ times per day.  STRENGTH - Scapular Retractors and External Rotators   Secure a rubber exercise band or tubing to a fixed object (table, pole) so that it is at the height as your shoulders,  when you are either standing, or sitting on a firm armless chair.  With a palm down grip, grasp an end of the band in each hand. Bend your elbows 90 degrees and lift your elbows to shoulder height, at your sides. Step back, away from the secured end of the band, until it becomes tense.  Squeezing your shoulder blades together, rotate your shoulders so that your upper arms and elbows remain stationary, but your fists travel upward to head height.  Hold for __________ seconds. Slowly ease the tension on the band, as you reverse the directions and return to the starting position. Repeat __________ times. Complete this exercise __________ times per day.  STRENGTH - Scapular Retractors and External Rotators, Rowing   Secure a rubber exercise band or tubing to a fixed object (table, pole) so that it is at the height of your shoulders, when you are either standing, or sitting on a firm armless chair.  With a palm down grip, grasp an end of the band in each hand. Straighten your elbows and lift your hands straight in front of you, at shoulder height. Step back, away from the secured end of the band, until it becomes tense.  Step 1: Squeeze your shoulder blades together. Bending your elbows, draw your hands to your chest, as if you are rowing a boat. At the end of this motion, your hands and elbow should be at shoulder height and your elbows should be out to your sides.  Step 2: Rotate your shoulders, to raise your hands above your head. Your forearms should be vertical and your upper arms should be horizontal.  Hold for __________ seconds. Slowly ease the tension on the band, as you reverse the directions and return to the starting position. Repeat __________ times. Complete this exercise __________  times per day.  STRENGTH - Scapular Depressors  Find a sturdy chair without wheels, such as a dining room chair.  Keeping your feet on the floor, and your hands on the chair arms, lift your bottom up from  the seat, and lock your elbows.  Keeping your elbows straight, allow gravity to pull your body weight down. Your shoulders will rise toward your ears.  Raise your body against gravity by drawing your shoulder blades down your back, shortening the distance between your shoulders and ears. Although your feet should always maintain contact with the floor, your feet should progressively support less body weight, as you get stronger.  Hold for __________ seconds. In a controlled and slow manner, lower your body weight to begin the next repetition. Repeat __________ times. Complete this exercise __________ times per day.    This information is not intended to replace advice given to you by your health care provider. Make sure you discuss any questions you have with your health care provider.   Document Released: 04/25/2005 Document Revised: 05/16/2014 Document Reviewed: 08/07/2008 [employer] Interactive Patient Education [employer].

## 2016-01-27 NOTE — Progress Notes (Signed)
Subjective:    Patient ID: Heather [name], female    DOB: [birth year], 70 y.o.   MRN: [id number]  HPI   Heather [name] is here in follow up of her thoracic myelopathy. She has been trying to do more such as housework, chores. She has driven into [place] with supervision. Her knees bother her when she's standing for longer peroids of time. She likes to use the recumbent bike also. She often walks at home without her braces.   Her left shoulder has been giving her problems especially with overhead activiteis. She is worried it's her rotator cuff. sh'es had problems with the right shoudler in the past.   She has persistent tingling in both feet as well as the chest. Her gabapentin isn't covering it as well as before. The gabapentin also makes her sleepy.    Pain Inventory Average Pain 4 Pain Right Now 4 My pain is sharp and burning  In the last 24 hours, has pain interfered with the following? General activity 5 Relation with others 0 Enjoyment of life 4 What TIME of day is your pain at its worst? daytime Sleep (in general) NA  Pain is worse with: standing Pain improves with: heat/ice and medication Relief from Meds: 5  Mobility use a cane how many minutes can you walk? 10-15 ability to climb steps?  yes  Function retired I need assistance with the following:  shopping  Neuro/Psych weakness trouble walking spasms  Prior Studies Any changes since last visit?  no  Physicians involved in your care Any changes since last visit?  no   Family History  Problem Relation Age of Onset  . Leukemia Mother   . Stroke Father   . Diabetes Mellitus II Father   . Colon cancer Brother   . Cancer Brother     Prostate  . Colon cancer Maternal Grandmother    Social History   Social History  . Marital status: Married    Spouse name: N/A  . Number of children: 2  . Years of education: N/A   Social History Main Topics  . Smoking status: Never Smoker  . Smokeless tobacco:  Never Used  . Alcohol use No  . Drug use: No  . Sexual activity: Yes    Birth control/ protection: Post-menopausal   Other Topics Concern  . None   Social History Narrative  . None   Past Surgical History:  Procedure Laterality Date  . COLONOSCOPY    . cyst removed from left breast  1970  . DECOMPRESSIVE LUMBAR LAMINECTOMY LEVEL 4 N/A 02/15/2015   Procedure: DECOMPRESSION T5 AND T3-T7 STABALIZATION;  Surgeon: Consuella Lose, MD;  Location: [place] [facility];  Service: Neurosurgery;  Laterality: N/A;  . ESOPHAGOGASTRODUODENOSCOPY    . LAMINECTOMY N/A 03/27/2015   Procedure: Thoracic Four-Thoracic Six Laminectomy for tumor;  Surgeon: Consuella Lose, MD;  Location: [place] [facility];  Service: Neurosurgery;  Laterality: N/A;  T4-T6 Laminectomy  . left wrist surgery   2004   with plate  . MASTECTOMY MODIFIED RADICAL  05/10/2012   Procedure: MASTECTOMY MODIFIED RADICAL;  Surgeon: Merrie Roof, MD;  Location: [place];  Service: General;  Laterality: Right;  RIGHT MODIFIED RADICAL MASTECTOMY  . MASTECTOMY, RADICAL    . PORTACATH PLACEMENT  05/10/2012   Procedure: INSERTION PORT-A-CATH;  Surgeon: Merrie Roof, MD;  Location: [place];  Service: General;  Laterality: Left;   Past Medical History:  Diagnosis Date  . Anemia    hx  of  . Anxiety    r/t updated surgery  . Breast cancer ([place])    right  . Cataract    immature-not sure of which eye  . Chronic kidney disease   . Complication of anesthesia    pt states she is sensitive to meds  . Dizziness    has been going on for 63month;medical MD aware  . GERD (gastroesophageal reflux disease)    Tums prn  . H. pylori infection   . H/O hiatal hernia   . Hemorrhoids   . History of UTI   . Hx of radiation therapy 10/25/12- 12/11/12   right chest wall/regional lymph nodes 5040 cGy, 28 sessions, right chest wall boost 1000 cGy 1 session  . Hx: UTI (urinary tract infection)   . Hyperlipidemia    but not on meds;diet and exercise controlled  .  Hypertension    recently started Aldactone   . Insomnia    takes Melatoniin daily  . Metastasis to spinal column (HCC)    T5  . PONV (postoperative nausea and vomiting)    pt experienced hair loss, confusion and combative- 02/15/15  . Right knee pain   . Right shoulder pain   . Skin cancer   . Urinary frequency    d/t taking Aldactone   BP 137/84   Pulse 72   Resp 14   SpO2 97%   Opioid Risk Score:   Fall Risk Score:  `1  Depression screen PHQ 2/9  [facility] 01/27/2016 08/20/2015 06/01/2015 05/08/2015 04/07/2015  Decreased Interest 0 0 1 0 1  Down, Depressed, Hopeless 0 0 1 0 1  PHQ - 2 Score 0 0 2 0 2  Altered sleeping - - - - 0  Tired, decreased energy - - - - 1  Change in appetite - - - - 0  Feeling bad or failure about yourself  - - - - 1  Trouble concentrating - - - - 1  Moving slowly or fidgety/restless - - - - 0  Suicidal thoughts - - - - 0  PHQ-9 Score - - - - 5  Difficult doing work/chores - - - - Not difficult at all  Some recent data might be hidden   Review of Systems  All other systems reviewed and are negative.      Objective:   Physical Exam  Constitutional: She appears well-developed and well-nourished. No distress.  HENT: dentition intact.  Head: Normocephalic and atraumatic.  Mouth/Throat: Oropharynx is clear and moist. No obvious lesions  Eyes: Conjunctivae and EOM are normal. Conj WNL  Neck: Normal range of motion. Neck supple.  Cardiovascular: Normal rate and regular rhythm.  No murmur heard.  Respiratory: Effort normal and breath sounds normal.  GI: Soft. Bowel sounds are normal. She exhibits no distension. There is no tenderness.  Musculoskeletal: She exhibits no tenderness. Left shoulder pain with abduction past 70 degrees. +impingement sign  Neurological: She is alert and oriented. Strength UE grossly 4+/5 with inhibition proximally due to pain. LLE-- hip flexion 4/5, KE and KF 4/5 ankle dorsiflexion 4/5, plantar flexion  4+/5, RLE--- 4-/5HF, 4-KE, 4- ADF/APF  Sensation remains decreased below T5-6 with decreased proprioception. Gait improved. No steppage. Knees still hyperextend, especially the right. No resting tone. DTR's 3+ bilateral LE's.  Skin: Skin is warm and dry. No rash noted. No erythema. Surgical incision closed/dry Psych: anxiety    Assessment & Plan:   Medical Problem List and Plan:  1. Functional deficits secondary  to T5 cord compression/myelopathy from pathologic compression fracture, metastatic breast cancer. Repeat decompression 03/22/15  -continue with outpatient therapies. She continues to make great progress.   -continue AFO's for gait---needs to focus on mechanics. Water walking would be ideal.  2. DVT Prophylaxis/Anticoagulation: eliquis  3. Pain Management: . Continue tramadol prn, robaxin prn.  - gabapentin '600mg'$  TID for dysesthesias---add addnl '300mg'$  at bed time   -consider TCA 4. Onc:  -ongoing plan per onc/rad-onc teams  5. Neurogenic bowel and bladder: emptying bowels.  -continue a timed schedule with bladder emptying and a qam bowel program  6. Left shoulder pain---likely RTC injury---stretches were provided. Consider injection if needed.  25 minutes of face to face patient care time were spent during this visit. All questions were encouraged and answered. Follow up in 3 months

## 2016-01-28 ENCOUNTER — Ambulatory Visit (HOSPITAL_BASED_OUTPATIENT_CLINIC_OR_DEPARTMENT_OTHER): Payer: Medicare Other

## 2016-01-28 VITALS — BP 122/70 | HR 71 | Temp 98.5°F | Resp 16

## 2016-01-28 DIAGNOSIS — C7951 Secondary malignant neoplasm of bone: Secondary | ICD-10-CM

## 2016-01-28 DIAGNOSIS — Z5112 Encounter for antineoplastic immunotherapy: Secondary | ICD-10-CM | POA: Diagnosis not present

## 2016-01-28 DIAGNOSIS — C773 Secondary and unspecified malignant neoplasm of axilla and upper limb lymph nodes: Secondary | ICD-10-CM

## 2016-01-28 DIAGNOSIS — C50411 Malignant neoplasm of upper-outer quadrant of right female breast: Secondary | ICD-10-CM | POA: Diagnosis not present

## 2016-01-28 DIAGNOSIS — C50911 Malignant neoplasm of unspecified site of right female breast: Secondary | ICD-10-CM

## 2016-01-28 MED ORDER — SODIUM CHLORIDE 0.9 % IJ SOLN
10.0000 mL | INTRAMUSCULAR | Status: DC | PRN
  Administered 2016-01-28: 10 mL
  Filled 2016-01-28: qty 10

## 2016-01-28 MED ORDER — SODIUM CHLORIDE 0.9 % IV SOLN
Freq: Once | INTRAVENOUS | Status: AC
  Administered 2016-01-28: 09:00:00 via INTRAVENOUS

## 2016-01-28 MED ORDER — ACETAMINOPHEN 325 MG PO TABS
ORAL_TABLET | ORAL | Status: AC
  Filled 2016-01-28: qty 2

## 2016-01-28 MED ORDER — ACETAMINOPHEN 325 MG PO TABS
ORAL_TABLET | ORAL | Status: AC
Start: 2016-01-28 — End: 2016-01-28
  Filled 2016-01-28: qty 1

## 2016-01-28 MED ORDER — TRASTUZUMAB CHEMO 150 MG IV SOLR
6.0000 mg/kg | Freq: Once | INTRAVENOUS | Status: AC
  Administered 2016-01-28: 399 mg via INTRAVENOUS
  Filled 2016-01-28: qty 19

## 2016-01-28 MED ORDER — ACETAMINOPHEN 325 MG PO TABS
650.0000 mg | ORAL_TABLET | Freq: Once | ORAL | Status: AC
  Administered 2016-01-28: 650 mg via ORAL

## 2016-01-28 MED ORDER — HEPARIN SOD (PORK) LOCK FLUSH 100 UNIT/ML IV SOLN
500.0000 [IU] | Freq: Once | INTRAVENOUS | Status: AC | PRN
  Administered 2016-01-28: 500 [IU]
  Filled 2016-01-28: qty 5

## 2016-01-28 NOTE — Patient Instructions (Signed)
[facility] Discharge Instructions for Patients Receiving Chemotherapy  Today you received the following chemotherapy agents herceptin   To help prevent nausea and vomiting after your treatment, we encourage you to take your nausea medication as directed   If you develop nausea and vomiting that is not controlled by your nausea medication, call the clinic.   BELOW ARE SYMPTOMS THAT SHOULD BE REPORTED IMMEDIATELY:  *FEVER GREATER THAN 100.5 F  *CHILLS WITH OR WITHOUT FEVER  NAUSEA AND VOMITING THAT IS NOT CONTROLLED WITH YOUR NAUSEA MEDICATION  *UNUSUAL SHORTNESS OF BREATH  *UNUSUAL BRUISING OR BLEEDING  TENDERNESS IN MOUTH AND THROAT WITH OR WITHOUT PRESENCE OF ULCERS  *URINARY PROBLEMS  *BOWEL PROBLEMS  UNUSUAL RASH Items with * indicate a potential emergency and should be followed up as soon as possible.  Feel free to call the clinic you have any questions or concerns. The clinic phone number is [phone].  

## 2016-02-18 ENCOUNTER — Ambulatory Visit (HOSPITAL_BASED_OUTPATIENT_CLINIC_OR_DEPARTMENT_OTHER): Payer: Medicare Other

## 2016-02-18 VITALS — BP 140/73 | HR 74 | Temp 98.3°F | Resp 18

## 2016-02-18 DIAGNOSIS — C50411 Malignant neoplasm of upper-outer quadrant of right female breast: Secondary | ICD-10-CM

## 2016-02-18 DIAGNOSIS — C7951 Secondary malignant neoplasm of bone: Secondary | ICD-10-CM | POA: Diagnosis not present

## 2016-02-18 DIAGNOSIS — Z5112 Encounter for antineoplastic immunotherapy: Secondary | ICD-10-CM

## 2016-02-18 DIAGNOSIS — C50911 Malignant neoplasm of unspecified site of right female breast: Secondary | ICD-10-CM

## 2016-02-18 MED ORDER — SODIUM CHLORIDE 0.9 % IV SOLN
Freq: Once | INTRAVENOUS | Status: AC
  Administered 2016-02-18: 09:00:00 via INTRAVENOUS

## 2016-02-18 MED ORDER — DIPHENHYDRAMINE HCL 25 MG PO CAPS: 25.0000 mg | ORAL_CAPSULE | Freq: Once | ORAL | Status: DC

## 2016-02-18 MED ORDER — ACETAMINOPHEN 325 MG PO TABS
650.0000 mg | ORAL_TABLET | Freq: Once | ORAL | Status: AC
  Administered 2016-02-18: 650 mg via ORAL

## 2016-02-18 MED ORDER — SODIUM CHLORIDE 0.9 % IJ SOLN
10.0000 mL | INTRAMUSCULAR | Status: DC | PRN
  Administered 2016-02-18: 10 mL
  Filled 2016-02-18: qty 10

## 2016-02-18 MED ORDER — HEPARIN SOD (PORK) LOCK FLUSH 100 UNIT/ML IV SOLN
500.0000 [IU] | Freq: Once | INTRAVENOUS | Status: DC | PRN
  Filled 2016-02-18: qty 5

## 2016-02-18 MED ORDER — ALTEPLASE 2 MG IJ SOLR
2.0000 mg | Freq: Once | INTRAMUSCULAR | Status: DC | PRN
  Filled 2016-02-18: qty 2

## 2016-02-18 MED ORDER — SODIUM CHLORIDE 0.9 % IJ SOLN
10.0000 mL | INTRAMUSCULAR | Status: DC | PRN
  Filled 2016-02-18: qty 10

## 2016-02-18 MED ORDER — ZOLEDRONIC ACID 4 MG/100ML IV SOLN
4.0000 mg | Freq: Once | INTRAVENOUS | Status: AC
  Administered 2016-02-18: 4 mg via INTRAVENOUS
  Filled 2016-02-18: qty 100

## 2016-02-18 MED ORDER — ACETAMINOPHEN 325 MG PO TABS
ORAL_TABLET | ORAL | Status: AC
  Filled 2016-02-18: qty 2

## 2016-02-18 MED ORDER — HEPARIN SOD (PORK) LOCK FLUSH 100 UNIT/ML IV SOLN
500.0000 [IU] | Freq: Once | INTRAVENOUS | Status: AC | PRN
  Administered 2016-02-18: 500 [IU]
  Filled 2016-02-18: qty 5

## 2016-02-18 MED ORDER — TRASTUZUMAB CHEMO 150 MG IV SOLR
6.0000 mg/kg | Freq: Once | INTRAVENOUS | Status: AC
  Administered 2016-02-18: 399 mg via INTRAVENOUS
  Filled 2016-02-18: qty 19

## 2016-02-18 NOTE — Patient Instructions (Signed)
[facility] Discharge Instructions for Patients Receiving Chemotherapy  Today you received the following chemotherapy agents herceptin   To help prevent nausea and vomiting after your treatment, we encourage you to take your nausea medication as directed   If you develop nausea and vomiting that is not controlled by your nausea medication, call the clinic.   BELOW ARE SYMPTOMS THAT SHOULD BE REPORTED IMMEDIATELY:  *FEVER GREATER THAN 100.5 F  *CHILLS WITH OR WITHOUT FEVER  NAUSEA AND VOMITING THAT IS NOT CONTROLLED WITH YOUR NAUSEA MEDICATION  *UNUSUAL SHORTNESS OF BREATH  *UNUSUAL BRUISING OR BLEEDING  TENDERNESS IN MOUTH AND THROAT WITH OR WITHOUT PRESENCE OF ULCERS  *URINARY PROBLEMS  *BOWEL PROBLEMS  UNUSUAL RASH Items with * indicate a potential emergency and should be followed up as soon as possible.  Feel free to call the clinic you have any questions or concerns. The clinic phone number is [phone].  

## 2016-03-10 ENCOUNTER — Other Ambulatory Visit: Payer: Self-pay

## 2016-03-10 ENCOUNTER — Ambulatory Visit (HOSPITAL_BASED_OUTPATIENT_CLINIC_OR_DEPARTMENT_OTHER): Payer: Medicare Other

## 2016-03-10 VITALS — BP 146/73 | HR 73 | Temp 98.2°F | Resp 18

## 2016-03-10 DIAGNOSIS — C773 Secondary and unspecified malignant neoplasm of axilla and upper limb lymph nodes: Secondary | ICD-10-CM | POA: Diagnosis not present

## 2016-03-10 DIAGNOSIS — C7951 Secondary malignant neoplasm of bone: Secondary | ICD-10-CM

## 2016-03-10 DIAGNOSIS — Z5112 Encounter for antineoplastic immunotherapy: Secondary | ICD-10-CM

## 2016-03-10 DIAGNOSIS — C50411 Malignant neoplasm of upper-outer quadrant of right female breast: Secondary | ICD-10-CM | POA: Diagnosis not present

## 2016-03-10 DIAGNOSIS — C50911 Malignant neoplasm of unspecified site of right female breast: Secondary | ICD-10-CM

## 2016-03-10 MED ORDER — ACETAMINOPHEN 325 MG PO TABS
650.0000 mg | ORAL_TABLET | Freq: Once | ORAL | Status: AC
  Administered 2016-03-10: 650 mg via ORAL

## 2016-03-10 MED ORDER — DIPHENHYDRAMINE HCL 25 MG PO CAPS: 25.0000 mg | ORAL_CAPSULE | Freq: Once | ORAL | Status: DC

## 2016-03-10 MED ORDER — SODIUM CHLORIDE 0.9 % IV SOLN
Freq: Once | INTRAVENOUS | Status: AC
  Administered 2016-03-10: 10:00:00 via INTRAVENOUS

## 2016-03-10 MED ORDER — HEPARIN SOD (PORK) LOCK FLUSH 100 UNIT/ML IV SOLN
500.0000 [IU] | Freq: Once | INTRAVENOUS | Status: AC | PRN
  Administered 2016-03-10: 500 [IU]
  Filled 2016-03-10: qty 5

## 2016-03-10 MED ORDER — TRASTUZUMAB CHEMO 150 MG IV SOLR
6.0000 mg/kg | Freq: Once | INTRAVENOUS | Status: AC
  Administered 2016-03-10: 399 mg via INTRAVENOUS
  Filled 2016-03-10: qty 19

## 2016-03-10 MED ORDER — LETROZOLE 2.5 MG PO TABS: 2.5000 mg | ORAL_TABLET | Freq: Every day | ORAL | 3 refills | Status: DC

## 2016-03-10 MED ORDER — ACETAMINOPHEN 325 MG PO TABS
ORAL_TABLET | ORAL | Status: AC
  Filled 2016-03-10: qty 2

## 2016-03-10 MED ORDER — SODIUM CHLORIDE 0.9 % IJ SOLN
10.0000 mL | INTRAMUSCULAR | Status: DC | PRN
Start: 2016-03-10 — End: 2016-03-10
  Administered 2016-03-10: 10 mL
  Filled 2016-03-10: qty 10

## 2016-03-10 NOTE — Patient Instructions (Signed)
[facility] Discharge Instructions for Patients Receiving Chemotherapy  Today you received the following chemotherapy agents herceptin   To help prevent nausea and vomiting after your treatment, we encourage you to take your nausea medication as directed   If you develop nausea and vomiting that is not controlled by your nausea medication, call the clinic.   BELOW ARE SYMPTOMS THAT SHOULD BE REPORTED IMMEDIATELY:  *FEVER GREATER THAN 100.5 F  *CHILLS WITH OR WITHOUT FEVER  NAUSEA AND VOMITING THAT IS NOT CONTROLLED WITH YOUR NAUSEA MEDICATION  *UNUSUAL SHORTNESS OF BREATH  *UNUSUAL BRUISING OR BLEEDING  TENDERNESS IN MOUTH AND THROAT WITH OR WITHOUT PRESENCE OF ULCERS  *URINARY PROBLEMS  *BOWEL PROBLEMS  UNUSUAL RASH Items with * indicate a potential emergency and should be followed up as soon as possible.  Feel free to call the clinic you have any questions or concerns. The clinic phone number is [phone].  

## 2016-03-11 ENCOUNTER — Telehealth (HOSPITAL_COMMUNITY): Payer: Self-pay | Admitting: Vascular Surgery

## 2016-03-11 NOTE — Telephone Encounter (Signed)
Left pt message to make f/u appt w. Echo in late Dec early Jan

## 2016-03-23 ENCOUNTER — Other Ambulatory Visit: Payer: Self-pay

## 2016-03-23 DIAGNOSIS — C50411 Malignant neoplasm of upper-outer quadrant of right female breast: Secondary | ICD-10-CM

## 2016-03-23 DIAGNOSIS — C7951 Secondary malignant neoplasm of bone: Secondary | ICD-10-CM

## 2016-03-23 DIAGNOSIS — C50911 Malignant neoplasm of unspecified site of right female breast: Secondary | ICD-10-CM

## 2016-03-30 ENCOUNTER — Other Ambulatory Visit (HOSPITAL_BASED_OUTPATIENT_CLINIC_OR_DEPARTMENT_OTHER): Payer: Medicare Other

## 2016-03-30 ENCOUNTER — Ambulatory Visit (HOSPITAL_BASED_OUTPATIENT_CLINIC_OR_DEPARTMENT_OTHER): Payer: Medicare Other

## 2016-03-30 VITALS — BP 116/60 | HR 71 | Temp 98.3°F | Resp 16 | Wt 134.1 lb

## 2016-03-30 DIAGNOSIS — C50411 Malignant neoplasm of upper-outer quadrant of right female breast: Secondary | ICD-10-CM

## 2016-03-30 DIAGNOSIS — Z23 Encounter for immunization: Secondary | ICD-10-CM

## 2016-03-30 DIAGNOSIS — C7951 Secondary malignant neoplasm of bone: Secondary | ICD-10-CM | POA: Diagnosis not present

## 2016-03-30 DIAGNOSIS — Z5112 Encounter for antineoplastic immunotherapy: Secondary | ICD-10-CM

## 2016-03-30 DIAGNOSIS — C50911 Malignant neoplasm of unspecified site of right female breast: Secondary | ICD-10-CM

## 2016-03-30 LAB — COMPREHENSIVE METABOLIC PANEL
ALT: 21 U/L (ref 0–55)
AST: 25 U/L (ref 5–34)
Albumin: 3.7 g/dL (ref 3.5–5.0)
Alkaline Phosphatase: 88 U/L (ref 40–150)
Anion Gap: 10 mEq/L (ref 3–11)
BUN: 16.6 mg/dL (ref 7.0–26.0)
CO2: 26 mEq/L (ref 22–29)
Calcium: 9.6 mg/dL (ref 8.4–10.4)
Chloride: 103 mEq/L (ref 98–109)
Creatinine: 0.8 mg/dL (ref 0.6–1.1)
EGFR: 80 mL/min/{1.73_m2} — ABNORMAL LOW (ref >90)
Glucose: 90 mg/dl (ref 70–140)
Potassium: 4.1 mEq/L (ref 3.5–5.1)
Sodium: 138 mEq/L (ref 136–145)
Total Bilirubin: 0.52 mg/dL (ref 0.20–1.20)
Total Protein: 7.4 g/dL (ref 6.4–8.3)

## 2016-03-30 LAB — CBC WITH DIFFERENTIAL/PLATELET
BASO%: 0.3 % (ref 0.0–2.0)
Basophils Absolute: 0 10*3/uL (ref 0.0–0.1)
EOS%: 1.1 % (ref 0.0–7.0)
Eosinophils Absolute: 0.1 10*3/uL (ref 0.0–0.5)
HCT: 38.8 % (ref 34.8–46.6)
HGB: 12.7 g/dL (ref 11.6–15.9)
LYMPH%: 13.5 % — ABNORMAL LOW (ref 14.0–49.7)
MCH: 27.8 pg (ref 25.1–34.0)
MCHC: 32.7 g/dL (ref 31.5–36.0)
MCV: 84.9 fL (ref 79.5–101.0)
MONO#: 0.5 10*3/uL (ref 0.1–0.9)
MONO%: 8.3 % (ref 0.0–14.0)
NEUT#: 4.9 10*3/uL (ref 1.5–6.5)
NEUT%: 76.8 % (ref 38.4–76.8)
Platelets: 191 10*3/uL (ref 145–400)
RBC: 4.57 10*6/uL (ref 3.70–5.45)
RDW: 13.7 % (ref 11.2–14.5)
WBC: 6.4 10*3/uL (ref 3.9–10.3)
lymph#: 0.9 10*3/uL (ref 0.9–3.3)

## 2016-03-30 MED ORDER — SODIUM CHLORIDE 0.9 % IV SOLN
6.0000 mg/kg | Freq: Once | INTRAVENOUS | Status: AC
  Administered 2016-03-30: 399 mg via INTRAVENOUS
  Filled 2016-03-30: qty 19

## 2016-03-30 MED ORDER — SODIUM CHLORIDE 0.9 % IV SOLN
Freq: Once | INTRAVENOUS | Status: AC
  Administered 2016-03-30: 10:00:00 via INTRAVENOUS

## 2016-03-30 MED ORDER — INFLUENZA VAC SPLIT QUAD 0.5 ML IM SUSY
0.5000 mL | PREFILLED_SYRINGE | Freq: Once | INTRAMUSCULAR | Status: AC
  Administered 2016-03-30: 0.5 mL via INTRAMUSCULAR
  Filled 2016-03-30: qty 0.5

## 2016-03-30 MED ORDER — ACETAMINOPHEN 325 MG PO TABS
650.0000 mg | ORAL_TABLET | Freq: Once | ORAL | Status: AC
  Administered 2016-03-30: 650 mg via ORAL

## 2016-03-30 MED ORDER — HEPARIN SOD (PORK) LOCK FLUSH 100 UNIT/ML IV SOLN
500.0000 [IU] | Freq: Once | INTRAVENOUS | Status: AC | PRN
  Administered 2016-03-30: 500 [IU]
  Filled 2016-03-30: qty 5

## 2016-03-30 MED ORDER — SODIUM CHLORIDE 0.9 % IJ SOLN
10.0000 mL | INTRAMUSCULAR | Status: DC | PRN
  Administered 2016-03-30: 10 mL
  Filled 2016-03-30: qty 10

## 2016-03-30 MED ORDER — ACETAMINOPHEN 325 MG PO TABS
ORAL_TABLET | ORAL | Status: AC
  Filled 2016-03-30: qty 2

## 2016-03-30 NOTE — Patient Instructions (Signed)
[facility] Discharge Instructions for Patients Receiving Chemotherapy  Today you received the following chemotherapy agents herceptin   To help prevent nausea and vomiting after your treatment, we encourage you to take your nausea medication as directed   If you develop nausea and vomiting that is not controlled by your nausea medication, call the clinic.   BELOW ARE SYMPTOMS THAT SHOULD BE REPORTED IMMEDIATELY:  *FEVER GREATER THAN 100.5 F  *CHILLS WITH OR WITHOUT FEVER  NAUSEA AND VOMITING THAT IS NOT CONTROLLED WITH YOUR NAUSEA MEDICATION  *UNUSUAL SHORTNESS OF BREATH  *UNUSUAL BRUISING OR BLEEDING  TENDERNESS IN MOUTH AND THROAT WITH OR WITHOUT PRESENCE OF ULCERS  *URINARY PROBLEMS  *BOWEL PROBLEMS  UNUSUAL RASH Items with * indicate a potential emergency and should be followed up as soon as possible.  Feel free to call the clinic you have any questions or concerns. The clinic phone number is [phone].  

## 2016-03-30 NOTE — Progress Notes (Signed)
Pt refused benadryl today

## 2016-04-07 ENCOUNTER — Ambulatory Visit (HOSPITAL_COMMUNITY)
Admission: RE | Admit: 2016-04-07 | Discharge: 2016-04-07 | Disposition: A | Discharge status: Home / Self Care | Payer: Medicare Other | Source: Ambulatory Visit | Attending: Hematology and Oncology | Admitting: Hematology and Oncology

## 2016-04-07 ENCOUNTER — Other Ambulatory Visit: Payer: Self-pay | Admitting: Hematology and Oncology

## 2016-04-07 ENCOUNTER — Telehealth: Payer: Self-pay | Admitting: Hematology and Oncology

## 2016-04-07 DIAGNOSIS — C50919 Malignant neoplasm of unspecified site of unspecified female breast: Secondary | ICD-10-CM

## 2016-04-07 DIAGNOSIS — C50411 Malignant neoplasm of upper-outer quadrant of right female breast: Secondary | ICD-10-CM | POA: Diagnosis not present

## 2016-04-07 DIAGNOSIS — R918 Other nonspecific abnormal finding of lung field: Secondary | ICD-10-CM | POA: Insufficient documentation

## 2016-04-07 MED ORDER — IOPAMIDOL (ISOVUE-300) INJECTION 61%
75.0000 mL | Freq: Once | INTRAVENOUS | Status: AC | PRN
  Administered 2016-04-07: 75 mL via INTRAVENOUS

## 2016-04-07 NOTE — Telephone Encounter (Signed)
I did call and discuss the CT chest with the patient. I discussed the fact that there was an abnormality between the lower thoracic vertebrae and the pleura. I would like to further evaluated with a PET/CT scan. We would like the PET/CT scan to be done ahead of the visit on 04/21/2016

## 2016-04-15 ENCOUNTER — Encounter (HOSPITAL_COMMUNITY)
Admission: RE | Admit: 2016-04-15 | Discharge: 2016-04-15 | Disposition: A | Discharge status: Home / Self Care | Payer: Medicare Other | Source: Ambulatory Visit | Attending: Hematology and Oncology | Admitting: Hematology and Oncology

## 2016-04-15 DIAGNOSIS — C50919 Malignant neoplasm of unspecified site of unspecified female breast: Secondary | ICD-10-CM | POA: Insufficient documentation

## 2016-04-15 DIAGNOSIS — C3490 Malignant neoplasm of unspecified part of unspecified bronchus or lung: Secondary | ICD-10-CM | POA: Diagnosis not present

## 2016-04-15 LAB — GLUCOSE, CAPILLARY: Glucose-Capillary: 118 mg/dL — ABNORMAL HIGH (ref 65–99)

## 2016-04-15 MED ORDER — FLUDEOXYGLUCOSE F - 18 (FDG) INJECTION
6.6700 | Freq: Once | INTRAVENOUS | Status: AC | PRN
  Administered 2016-04-15: 6.67 via INTRAVENOUS

## 2016-04-20 ENCOUNTER — Encounter: Payer: Self-pay | Admitting: Physical Medicine & Rehabilitation

## 2016-04-20 ENCOUNTER — Encounter: Payer: Medicare Other | Attending: Physical Medicine & Rehabilitation | Admitting: Physical Medicine & Rehabilitation

## 2016-04-20 VITALS — BP 144/84 | HR 73

## 2016-04-20 DIAGNOSIS — R42 Dizziness and giddiness: Secondary | ICD-10-CM | POA: Diagnosis not present

## 2016-04-20 DIAGNOSIS — R531 Weakness: Secondary | ICD-10-CM | POA: Diagnosis not present

## 2016-04-20 DIAGNOSIS — G4700 Insomnia, unspecified: Secondary | ICD-10-CM | POA: Insufficient documentation

## 2016-04-20 DIAGNOSIS — N319 Neuromuscular dysfunction of bladder, unspecified: Secondary | ICD-10-CM | POA: Diagnosis not present

## 2016-04-20 DIAGNOSIS — F419 Anxiety disorder, unspecified: Secondary | ICD-10-CM | POA: Diagnosis not present

## 2016-04-20 DIAGNOSIS — K449 Diaphragmatic hernia without obstruction or gangrene: Secondary | ICD-10-CM | POA: Insufficient documentation

## 2016-04-20 DIAGNOSIS — Z87440 Personal history of urinary (tract) infections: Secondary | ICD-10-CM | POA: Diagnosis not present

## 2016-04-20 DIAGNOSIS — G839 Paralytic syndrome, unspecified: Secondary | ICD-10-CM | POA: Diagnosis not present

## 2016-04-20 DIAGNOSIS — M722 Plantar fascial fibromatosis: Secondary | ICD-10-CM

## 2016-04-20 DIAGNOSIS — M4854XA Collapsed vertebra, not elsewhere classified, thoracic region, initial encounter for fracture: Secondary | ICD-10-CM | POA: Diagnosis not present

## 2016-04-20 DIAGNOSIS — D649 Anemia, unspecified: Secondary | ICD-10-CM | POA: Diagnosis not present

## 2016-04-20 DIAGNOSIS — M5104 Intervertebral disc disorders with myelopathy, thoracic region: Secondary | ICD-10-CM | POA: Diagnosis not present

## 2016-04-20 DIAGNOSIS — Z853 Personal history of malignant neoplasm of breast: Secondary | ICD-10-CM | POA: Diagnosis not present

## 2016-04-20 DIAGNOSIS — E785 Hyperlipidemia, unspecified: Secondary | ICD-10-CM | POA: Insufficient documentation

## 2016-04-20 DIAGNOSIS — K219 Gastro-esophageal reflux disease without esophagitis: Secondary | ICD-10-CM | POA: Diagnosis not present

## 2016-04-20 DIAGNOSIS — I129 Hypertensive chronic kidney disease with stage 1 through stage 4 chronic kidney disease, or unspecified chronic kidney disease: Secondary | ICD-10-CM | POA: Diagnosis not present

## 2016-04-20 DIAGNOSIS — G9520 Unspecified cord compression: Secondary | ICD-10-CM

## 2016-04-20 DIAGNOSIS — G8220 Paraplegia, unspecified: Secondary | ICD-10-CM

## 2016-04-20 DIAGNOSIS — D492 Neoplasm of unspecified behavior of bone, soft tissue, and skin: Secondary | ICD-10-CM

## 2016-04-20 DIAGNOSIS — N189 Chronic kidney disease, unspecified: Secondary | ICD-10-CM | POA: Diagnosis not present

## 2016-04-20 DIAGNOSIS — M25512 Pain in left shoulder: Secondary | ICD-10-CM | POA: Insufficient documentation

## 2016-04-20 DIAGNOSIS — C50919 Malignant neoplasm of unspecified site of unspecified female breast: Secondary | ICD-10-CM | POA: Diagnosis not present

## 2016-04-20 DIAGNOSIS — C7951 Secondary malignant neoplasm of bone: Secondary | ICD-10-CM

## 2016-04-20 DIAGNOSIS — G9529 Other cord compression: Secondary | ICD-10-CM

## 2016-04-20 DIAGNOSIS — Z85828 Personal history of other malignant neoplasm of skin: Secondary | ICD-10-CM | POA: Insufficient documentation

## 2016-04-20 DIAGNOSIS — R350 Frequency of micturition: Secondary | ICD-10-CM | POA: Insufficient documentation

## 2016-04-20 MED ORDER — GABAPENTIN 600 MG PO TABS: 600.0000 mg | ORAL_TABLET | ORAL | 2 refills | Status: DC

## 2016-04-20 NOTE — Progress Notes (Signed)
Subjective:    Patient ID: Heather [name], female    DOB: [birth year], 70 y.o.   MRN: [id number]  HPI   Heather [name] is here in follow up of her thoracic myelopathy. She has been experiencing shooting pain in her heels/feet which tends to wake her up in the morning. It lasts for a few minutes until she massages her feet, and then it tends to go away.   The increase in her gabapentin has helped some of the dysesthetic pain in her legs and has helped her sleep.    Pain Inventory Average Pain 3 Pain Right Now 0 My pain is intermittent, burning and aching  In the last 24 hours, has pain interfered with the following? General activity 1 Relation with others 1 Enjoyment of life 3 What TIME of day is your pain at its worst? daytime Sleep (in general) Fair  Pain is worse with: standing and some activites Pain improves with: rest, heat/ice and medication Relief from Meds: 5  Mobility use a cane ability to climb steps?  yes do you drive?  yes Do you have any goals in this area?  yes  Function retired  Neuro/Psych No problems in this area  Prior Studies Any changes since last visit?  no  Physicians involved in your care Any changes since last visit?  no   Family History  Problem Relation Age of Onset  . Leukemia Mother   . Stroke Father   . Diabetes Mellitus II Father   . Colon cancer Brother   . Cancer Brother     Prostate  . Colon cancer Maternal Grandmother    Social History   Social History  . Marital status: Married    Spouse name: N/A  . Number of children: 2  . Years of education: N/A   Social History Main Topics  . Smoking status: Never Smoker  . Smokeless tobacco: Never Used  . Alcohol use No  . Drug use: No  . Sexual activity: Yes    Birth control/ protection: Post-menopausal   Other Topics Concern  . Not on file   Social History Narrative  . No narrative on file   Past Surgical History:  Procedure Laterality Date  . COLONOSCOPY    . cyst  removed from left breast  1970  . DECOMPRESSIVE LUMBAR LAMINECTOMY LEVEL 4 N/A 02/15/2015   Procedure: DECOMPRESSION T5 AND T3-T7 STABALIZATION;  Surgeon: Consuella Lose, MD;  Location: [place] [facility];  Service: Neurosurgery;  Laterality: N/A;  . ESOPHAGOGASTRODUODENOSCOPY    . LAMINECTOMY N/A 03/27/2015   Procedure: Thoracic Four-Thoracic Six Laminectomy for tumor;  Surgeon: Consuella Lose, MD;  Location: [place] [facility];  Service: Neurosurgery;  Laterality: N/A;  T4-T6 Laminectomy  . left wrist surgery   2004   with plate  . MASTECTOMY MODIFIED RADICAL  05/10/2012   Procedure: MASTECTOMY MODIFIED RADICAL;  Surgeon: Merrie Roof, MD;  Location: [place];  Service: General;  Laterality: Right;  RIGHT MODIFIED RADICAL MASTECTOMY  . MASTECTOMY, RADICAL    . PORTACATH PLACEMENT  05/10/2012   Procedure: INSERTION PORT-A-CATH;  Surgeon: Merrie Roof, MD;  Location: [place];  Service: General;  Laterality: Left;   Past Medical History:  Diagnosis Date  . Anemia    hx of  . Anxiety    r/t updated surgery  . Breast cancer ([place])    right  . Cataract    immature-not sure of which eye  . Chronic kidney disease   .  Complication of anesthesia    pt states she is sensitive to meds  . Dizziness    has been going on for 93month;medical MD aware  . GERD (gastroesophageal reflux disease)    Tums prn  . H. pylori infection   . H/O hiatal hernia   . Hemorrhoids   . History of UTI   . Hx of radiation therapy 10/25/12- 12/11/12   right chest wall/regional lymph nodes 5040 cGy, 28 sessions, right chest wall boost 1000 cGy 1 session  . Hx: UTI (urinary tract infection)   . Hyperlipidemia    but not on meds;diet and exercise controlled  . Hypertension    recently started Aldactone   . Insomnia    takes Melatoniin daily  . Metastasis to spinal column (HCC)    T5  . PONV (postoperative nausea and vomiting)    pt experienced hair loss, confusion and combative- 02/15/15  . Right knee pain   . Right  shoulder pain   . Skin cancer   . Urinary frequency    d/t taking Aldactone   There were no vitals taken for this visit.  Opioid Risk Score:   Fall Risk Score:  `1  Depression screen PHQ 2/9  [facility] 01/27/2016 08/20/2015 06/01/2015 05/08/2015 04/07/2015  Decreased Interest 0 0 1 0 1  Down, Depressed, Hopeless 0 0 1 0 1  PHQ - 2 Score 0 0 2 0 2  Altered sleeping - - - - 0  Tired, decreased energy - - - - 1  Change in appetite - - - - 0  Feeling bad or failure about yourself  - - - - 1  Trouble concentrating - - - - 1  Moving slowly or fidgety/restless - - - - 0  Suicidal thoughts - - - - 0  PHQ-9 Score - - - - 5  Difficult doing work/chores - - - - Not difficult at all  Some recent data might be hidden   Review of Systems  Constitutional: Negative.   HENT: Negative.   Eyes: Negative.   Respiratory: Negative.   Cardiovascular: Negative.   Gastrointestinal: Negative.   Endocrine: Negative.   Genitourinary: Negative.   Musculoskeletal: Positive for gait problem.  Skin: Negative.   Allergic/Immunologic: Negative.   Hematological: Negative.   Psychiatric/Behavioral: Negative.   All other systems reviewed and are negative.      Objective:   Physical Exam  Constitutional: She appears well-developed and well-nourished. No distress.  HENT: dentition normal  Head: Normocephalic and atraumatic.  Mouth/Throat: Oropharynx is clear and moist. No obvious lesions  Eyes: PERRL  Neck: Normal range of motion. Neck supple.  Cardiovascular: Normal rate and regular rhythm.  No murmur heard.  Respiratory: Effort normal and breath sounds normal.  GI: Soft. Bowel sounds are normal. She exhibits no distension. There is no tenderness.  Musculoskeletal: both heels slightly tender with palpation as well as mediali arch, right more than left.  Neurological: She is alert and oriented. Strength UE grossly 4+/5 with inhibition proximally due to pain. LLE-- hip flexion 4/5, KE  and KF 4/5 ankle dorsiflexion 4/5, plantar flexion 4+/5, RLE--- 4-/5HF, 4-KE, 4- ADF/APF  Sensory decreased to LT below T5-6 with decreased proprioception. Makes effort to walk with good ankle dorsiflexion during swing phase of gait. Uses cane for balance. Appears to be steady on feet.. No resting tone. DTR's 3+ bilateral LE's.  Skin: Skin is warm and dry. No rash noted. No erythema. Surgical incision closed/dry Psych: anxiety  Assessment & Plan:  Medical Problem List and Plan: 1. Functional deficits secondary to T5 cord compression/myelopathy from pathologic compression fracture, metastatic breast cancer. Repeat decompression 03/22/15  -needs to work on pacing and some adaptations.  -discussed natural recovery of SCI.  -can consider revisiting gait therapy in 2018 2. DVT Prophylaxis/Anticoagulation: eliquis  3. Pain Management: . Continue tramadol prn, robaxin prn.  - gabapentin '600mg'$  TID for dysesthesias---add addnl '300mg'$  at bed time--3 month rx given  4. Onc:  -ongoing plan per onc/rad-onc teams  5. Neurogenic bowel and bladder: emptying bowels.  - timed schedule with bladder emptying and a qam bowel program fairly effective  6. Left shoulder pain---likely RTC injury---improved with ROM/adaptation techniques 7. Bilateral heel pain---suspect plantar fasciitis, ?mild heel spurs  -ice  -heel cushion/medial arch supports  -exercises were provided  15 minutes of face to face patient care time were spent during this visit. All questions were encouraged and answered. Follow up in 46month

## 2016-04-20 NOTE — Assessment & Plan Note (Signed)
Right breast invasive ductal carcinoma ER positive PR negative HER-2 positive Ki-67 44% multifocal disease 3/7 lymph nodes positive T2 N1 M0 stage IIB status post adjuvant chemotherapy with TCH followed by Herceptin maintenance, adjuvant radiation therapy, currently on letrozole since 02/07/2013. MRI 02/14/2015: T5 large destructive lesion with pathologic compression fracture and extensive epidural tumor, involvement of T4, excision metastatic carcinoma ER 60%, PR 0%, HER-2 positive ratio 2.71 average copy #7.45 03/27/2015: T4-6 decompression surgery for spinal cord compression Radiation therapy to the spine (Dr. [name]) 04/13/2015 to 05/19/2015  Treatment plan: 1. Resumed anastrozole 05/21/2015, but it has caused significant hair loss so we switched her to exemestane 25 mg once daily 07/23/2015 2. Herceptin every 3 week started 04/10/2015 3. Obtain echocardiograms every 3 months 4. Bone metastases: Zometa every 3 months. (Change made because of diffuse bone pain related to Zometa infusions) -------------------------------------------------------------------------------------------------------------------------- Goal of treatment: Palliation Echocardiogram 10/07/2015: EF 55-60% (stable from 07/2015 but overall down from 65-70%)   Zometa toxicities: bone pain and fatigue. If it causes persistent symptoms, Zometa every 3 months.   PET-CT 04/17/16 Rt para-spinal mass mildy hypermetabolic SUV 5.9, lytic cortical lesion 4.8 cm lesion left prox femur suv 8.8 favor osseus met disease  Radiology Review: I discussed with the patient the new finding of the lesion in the left femur. I recommended palliative XRT to the lesion. I also discussed other options including adding lapatinib or Pertuzumab

## 2016-04-20 NOTE — Patient Instructions (Signed)
Plantar Fasciitis Rehab Ask your health care provider which exercises are safe for you. Do exercises exactly as told by your health care provider and adjust them as directed. It is normal to feel mild stretching, pulling, tightness, or discomfort as you do these exercises, but you should stop right away if you feel sudden pain or your pain gets worse. Do not begin these exercises until told by your health care provider. Stretching and range of motion exercises These exercises warm up your muscles and joints and improve the movement and flexibility of your foot. These exercises also help to relieve pain. Exercise A: Plantar fascia stretch   1. Sit with your left / right leg crossed over your opposite knee. 2. Hold your heel with one hand with that thumb near your arch. With your other hand, hold your toes and gently pull them back toward the top of your foot. You should feel a stretch on the bottom of your toes or your foot or both. 3. Hold this stretch for__________ seconds. 4. Slowly release your toes and return to the starting position. Repeat __________ times. Complete this exercise __________ times a day. Exercise B: Gastroc, standing   1. Stand with your hands against a wall. 2. Extend your left / right leg behind you, and bend your front knee slightly. 3. Keeping your heels on the floor and keeping your back knee straight, shift your weight toward the wall without arching your back. You should feel a gentle stretch in your left / right calf. 4. Hold this position for __________ seconds. Repeat __________ times. Complete this exercise __________ times a day. Exercise C: Soleus, standing  1. Stand with your hands against a wall. 2. Extend your left / right leg behind you, and bend your front knee slightly. 3. Keeping your heels on the floor, bend your back knee and slightly shift your weight over the back leg. You should feel a gentle stretch deep in your calf. 4. Hold this position for  __________ seconds. Repeat __________ times. Complete this exercise __________ times a day. Exercise D: Gastrocsoleus, standing  1. Stand with the ball of your left / right foot on a step. The ball of your foot is on the walking surface, right under your toes. 2. Keep your other foot firmly on the same step. 3. Hold onto the wall or a railing for balance. 4. Slowly lift your other foot, allowing your body weight to press your heel down over the edge of the step. You should feel a stretch in your left / right calf. 5. Hold this position for __________ seconds. 6. Return both feet to the step. 7. Repeat this exercise with a slight bend in your left / right knee. Repeat __________ times with your left / right knee straight and __________ times with your left / right knee bent. Complete this exercise __________ times a day. Balance exercise This exercise builds your balance and strength control of your arch to help take pressure off your plantar fascia. Exercise E: Single leg stand  1. Without shoes, stand near a railing or in a doorway. You may hold onto the railing or door frame as needed. 2. Stand on your left / right foot. Keep your big toe down on the floor and try to keep your arch lifted. Do not let your foot roll inward. 3. Hold this position for __________ seconds. 4. If this exercise is too easy, you can try it with your eyes closed or while standing on a   pillow. Repeat __________ times. Complete this exercise __________ times a day. This information is not intended to replace advice given to you by your health care provider. Make sure you discuss any questions you have with your health care provider. Document Released: 04/25/2005 Document Revised: 12/29/2015 Document Reviewed: 03/09/2015 Elsevier Interactive Patient Education  2017 [employer]  

## 2016-04-21 ENCOUNTER — Other Ambulatory Visit (HOSPITAL_BASED_OUTPATIENT_CLINIC_OR_DEPARTMENT_OTHER): Payer: Medicare Other

## 2016-04-21 ENCOUNTER — Ambulatory Visit (HOSPITAL_BASED_OUTPATIENT_CLINIC_OR_DEPARTMENT_OTHER): Payer: Medicare Other

## 2016-04-21 ENCOUNTER — Encounter: Payer: Self-pay | Admitting: Hematology and Oncology

## 2016-04-21 ENCOUNTER — Ambulatory Visit (HOSPITAL_BASED_OUTPATIENT_CLINIC_OR_DEPARTMENT_OTHER): Payer: Medicare Other | Admitting: Hematology and Oncology

## 2016-04-21 ENCOUNTER — Encounter: Payer: Self-pay | Admitting: Radiation Oncology

## 2016-04-21 VITALS — BP 151/70 | HR 77 | Temp 97.7°F | Resp 18 | Ht 64.0 in | Wt 138.6 lb

## 2016-04-21 DIAGNOSIS — G9529 Other cord compression: Secondary | ICD-10-CM

## 2016-04-21 DIAGNOSIS — C7951 Secondary malignant neoplasm of bone: Secondary | ICD-10-CM

## 2016-04-21 DIAGNOSIS — Z5112 Encounter for antineoplastic immunotherapy: Secondary | ICD-10-CM | POA: Diagnosis not present

## 2016-04-21 DIAGNOSIS — Z170 Estrogen receptor positive status [ER+]: Secondary | ICD-10-CM | POA: Diagnosis not present

## 2016-04-21 DIAGNOSIS — C50911 Malignant neoplasm of unspecified site of right female breast: Secondary | ICD-10-CM

## 2016-04-21 DIAGNOSIS — C50112 Malignant neoplasm of central portion of left female breast: Secondary | ICD-10-CM

## 2016-04-21 DIAGNOSIS — C50411 Malignant neoplasm of upper-outer quadrant of right female breast: Secondary | ICD-10-CM

## 2016-04-21 LAB — COMPREHENSIVE METABOLIC PANEL
ALT: 24 U/L (ref 0–55)
AST: 29 U/L (ref 5–34)
Albumin: 4.1 g/dL (ref 3.5–5.0)
Alkaline Phosphatase: 88 U/L (ref 40–150)
Anion Gap: 10 mEq/L (ref 3–11)
BUN: 16.3 mg/dL (ref 7.0–26.0)
CO2: 27 mEq/L (ref 22–29)
Calcium: 10.2 mg/dL (ref 8.4–10.4)
Chloride: 101 mEq/L (ref 98–109)
Creatinine: 0.9 mg/dL (ref 0.6–1.1)
EGFR: 67 mL/min/{1.73_m2} — ABNORMAL LOW (ref >90)
Glucose: 72 mg/dl (ref 70–140)
Potassium: 4.2 mEq/L (ref 3.5–5.1)
Sodium: 138 mEq/L (ref 136–145)
Total Bilirubin: 0.63 mg/dL (ref 0.20–1.20)
Total Protein: 8.1 g/dL (ref 6.4–8.3)

## 2016-04-21 LAB — CBC WITH DIFFERENTIAL/PLATELET
BASO%: 0.9 % (ref 0.0–2.0)
Basophils Absolute: 0 10*3/uL (ref 0.0–0.1)
EOS%: 0.9 % (ref 0.0–7.0)
Eosinophils Absolute: 0 10*3/uL (ref 0.0–0.5)
HCT: 44.3 % (ref 34.8–46.6)
HGB: 14.1 g/dL (ref 11.6–15.9)
LYMPH%: 14.9 % (ref 14.0–49.7)
MCH: 27.5 pg (ref 25.1–34.0)
MCHC: 32 g/dL (ref 31.5–36.0)
MCV: 86.1 fL (ref 79.5–101.0)
MONO#: 0.4 10*3/uL (ref 0.1–0.9)
MONO%: 8.3 % (ref 0.0–14.0)
NEUT#: 3.6 10*3/uL (ref 1.5–6.5)
NEUT%: 75 % (ref 38.4–76.8)
Platelets: 204 10*3/uL (ref 145–400)
RBC: 5.14 10*6/uL (ref 3.70–5.45)
RDW: 13.9 % (ref 11.2–14.5)
WBC: 4.8 10*3/uL (ref 3.9–10.3)
lymph#: 0.7 10*3/uL — ABNORMAL LOW (ref 0.9–3.3)

## 2016-04-21 MED ORDER — SODIUM CHLORIDE 0.9 % IV SOLN
Freq: Once | INTRAVENOUS | Status: AC
  Administered 2016-04-21: 11:00:00 via INTRAVENOUS

## 2016-04-21 MED ORDER — SODIUM CHLORIDE 0.9 % IJ SOLN
10.0000 mL | INTRAMUSCULAR | Status: DC | PRN
  Administered 2016-04-21: 10 mL
  Filled 2016-04-21: qty 10

## 2016-04-21 MED ORDER — DIPHENHYDRAMINE HCL 25 MG PO CAPS: 25.0000 mg | ORAL_CAPSULE | Freq: Once | ORAL | Status: DC

## 2016-04-21 MED ORDER — ACETAMINOPHEN 325 MG PO TABS
650.0000 mg | ORAL_TABLET | Freq: Once | ORAL | Status: AC
  Administered 2016-04-21: 650 mg via ORAL

## 2016-04-21 MED ORDER — HEPARIN SOD (PORK) LOCK FLUSH 100 UNIT/ML IV SOLN
500.0000 [IU] | Freq: Once | INTRAVENOUS | Status: AC | PRN
  Administered 2016-04-21: 500 [IU]
  Filled 2016-04-21: qty 5

## 2016-04-21 MED ORDER — DIPHENHYDRAMINE HCL 25 MG PO CAPS
ORAL_CAPSULE | ORAL | Status: AC
  Filled 2016-04-21: qty 2

## 2016-04-21 MED ORDER — SODIUM CHLORIDE 0.9 % IV SOLN
6.0000 mg/kg | Freq: Once | INTRAVENOUS | Status: AC
  Administered 2016-04-21: 399 mg via INTRAVENOUS
  Filled 2016-04-21: qty 19

## 2016-04-21 MED ORDER — ACETAMINOPHEN 325 MG PO TABS
ORAL_TABLET | ORAL | Status: AC
  Filled 2016-04-21: qty 2

## 2016-04-21 NOTE — Progress Notes (Signed)
Received message from [name] and reviewed PET.  Patient has metastasis in left femur and may benefit from palliative radiotherapy.  We will bring her in for evaluation for treatment.

## 2016-04-21 NOTE — Patient Instructions (Signed)
Cancer Center Discharge Instructions for Patients Receiving Chemotherapy  Today you received the following chemotherapy agents Herceptin.  To help prevent nausea and vomiting after your treatment, we encourage you to take your nausea medication as directed.   If you develop nausea and vomiting that is not controlled by your nausea medication, call the clinic.   BELOW ARE SYMPTOMS THAT SHOULD BE REPORTED IMMEDIATELY:  *FEVER GREATER THAN 100.5 F  *CHILLS WITH OR WITHOUT FEVER  NAUSEA AND VOMITING THAT IS NOT CONTROLLED WITH YOUR NAUSEA MEDICATION  *UNUSUAL SHORTNESS OF BREATH  *UNUSUAL BRUISING OR BLEEDING  TENDERNESS IN MOUTH AND THROAT WITH OR WITHOUT PRESENCE OF ULCERS  *URINARY PROBLEMS  *BOWEL PROBLEMS  UNUSUAL RASH Items with * indicate a potential emergency and should be followed up as soon as possible.  Feel free to call the clinic you have any questions or concerns. The clinic phone number is [phone].  

## 2016-04-21 NOTE — Progress Notes (Signed)
Patient Care Team: [name], MD as PCP - General (Family Medicine)  DIAGNOSIS:  Encounter Diagnoses  Name Primary?  . Spinal cord compression due to malignant neoplasm metastatic to spine ([place]) Yes  . Carcinoma of right breast metastatic to bone ([place])   . Primary cancer of upper outer quadrant of right female breast ([place])     SUMMARY OF ONCOLOGIC HISTORY:   Primary cancer of upper outer quadrant of right female breast ([place])   03/08/2012 Initial Diagnosis    Right breast invasive ductal carcinoma ER positive PR negative HER-2 positive Ki-67 44%; another breast mass biopsied in the anterior part of the breast which was also positive for malignancy that was HER-2 negative      05/10/2012 Surgery    Right mastectomy and axillary lymph node dissection: Multifocal disease 5 cm, 1.7 cm, 1.6 cm, ER positive PR negative HER-2 positive Ki-67 44%, 3/7 lymph nodes positive      06/14/2012 - 06/12/2013 Chemotherapy    Adjuvant chemotherapy with [place] 6 followed by Herceptin maintenance      10/25/2012 - 12/11/2012 Radiation Therapy    Adjuvant radiation therapy      02/07/2013 -  Anti-estrogen oral therapy    Letrozole 2.5 mg daily      02/14/2015 Imaging    MRI spine: Large destructive T5 lesion with severe pathologic compression fracture and extensive epidural tumor, severe spinal stenosis with moderate cord compression, tumor involvement of T4      03/18/2015 PET scan    Residual enhancing soft tissue adjacent to the spinal cord. No evidence of metastatic disease. Nonspecific uptake the left nipple      03/27/2015 - 03/30/2015 Hospital Admission    T4-6 decompression for spinal cord compression (lower extremity paralysis)      04/10/2015 -  Chemotherapy    Palliative treatment with Herceptin every 3 weeks with letrozole 2.5 mg daily      04/13/2015 - 05/19/2015 Radiation Therapy    Palliative radiation treatment to the spine      09/01/2015 Imaging    CT chest abdomen pelvis:  Pathologic fracture with posterior fusion at T5 no other evidence of metastatic disease in the chest abdomen pelvis      12/23/2015 Imaging    CT CAP: new nonspecific 0.6 cm lymph node was stated mediastinum needs follow-up CT, innumerable tiny groundglass pulmonary nodules throughout both lungs unchanged, patchy consolidation from radiation, T5 fracture, no mets      04/15/2016 PET scan    Rt para-spinal mass mildy hypermetabolic SUV 5.9, lytic cortical lesion 4.8 cm lesion left prox femur suv 8.8 favor osseus met disease       CHIEF COMPLIANT: Follow-up to review the PET CT scan, Herceptin with letrozole   INTERVAL HISTORY: Heather [name] is a 70 year old with above-mentioned history of metastatic breast cancer with cord compression who underwent a recent PET CT scan and is here today to discuss the results. She has had a fall at home recently but did not appear to hurt herself. She is able to walk with the help of a cane. PET CT scan suggested that they were new metastatic disease in her femur. There was also a subcutaneous lesion on the back of her neck.  She complains of pain in the back as well as in the left hip.  REVIEW OF SYSTEMS:   Constitutional: Denies fevers, chills or abnormal weight loss Eyes: Denies blurriness of vision Ears, nose, mouth, throat, and face: Denies mucositis or sore  throat Respiratory: Denies cough, dyspnea or wheezes Cardiovascular: Denies palpitation, chest discomfort Gastrointestinal:  Denies nausea, heartburn or change in bowel habits Skin: Denies abnormal skin rashes Lymphatics: Denies new lymphadenopathy or easy bruising Neurological Lower extremity weakness  Behavioral/Psych: Mood is stable, no new changes  Extremities:Lower extremity weakness  All other systems were reviewed with the patient and are negative.  I have reviewed the past medical history, past surgical history, social history and family history with the patient and they are unchanged  from previous note.  ALLERGIES:  is allergic to codeine; keflex [cephalexin]; naprosyn [naproxen]; oxycodone; and tussin [guaifenesin].  MEDICATIONS:  Current Outpatient Prescriptions  Medication Sig Dispense Refill  . b complex vitamins tablet Take 1 tablet by mouth daily.    . Calcium Carbonate Antacid (TUMS PO) Take 2 tablets by mouth 2 (two) times daily as needed (acid reflux).    . Cholecalciferol (VITAMIN D) 2000 UNITS tablet Take 2,000 Units by mouth daily.    . Coenzyme Q10 (COQ10) 100 MG CAPS Take 100 mg by mouth at bedtime.    . gabapentin (NEURONTIN) 600 MG tablet Take 1 tablet (600 mg total) by mouth as directed. Take 1 tablet in the AM and afternoon and 1 1/2 tabs at bedtime 315 tablet 2  . letrozole (FEMARA) 2.5 MG tablet Take 1 tablet (2.5 mg total) by mouth daily. 90 tablet 3  . lidocaine-prilocaine (EMLA) cream Apply to affected area once (Patient taking differently: Apply 1 application topically every 21 ( twenty-one) days. Apply to port prior to infusions) 30 g 3  . OVER THE COUNTER MEDICATION Place 1 drop into both eyes 2 (two) times daily as needed (dry eyes). Over the counter lubricating eye drops    . polyethylene glycol (MIRALAX / GLYCOLAX) packet Take 17 g by mouth 2 (two) times daily. 100 each 0  . Probiotic Product (PROBIOTIC DAILY PO) Take 1 capsule by mouth 2 (two) times daily.     . prochlorperazine (COMPAZINE) 10 MG tablet Take 1 tablet (10 mg total) by mouth every 6 (six) hours as needed for nausea or vomiting. 30 tablet 0  . ranitidine (ZANTAC) 300 MG tablet Take 300 mg by mouth at bedtime. Reported on 06/25/2015    . spironolactone (ALDACTONE) 25 MG tablet Take 0.5 tablets (12.5 mg total) by mouth daily. 45 tablet 12  . traMADol (ULTRAM) 50 MG tablet Take 1 tablet (50 mg total) by mouth every 12 (twelve) hours as needed for moderate pain. 30 tablet 0   No current facility-administered medications for this visit.    Facility-Administered Medications Ordered in  Other Visits  Medication Dose Route Frequency Provider Last Rate Last Dose  . diphenhydrAMINE (BENADRYL) capsule 25 mg  25 mg Oral Once [name], MD      . sodium chloride 0.9 % injection 10 mL  10 mL Intracatheter PRN [name], MD   10 mL at 01/07/16 1620  . sodium chloride 0.9 % injection 10 mL  10 mL Intracatheter PRN [name], MD   10 mL at 04/21/16 1155    PHYSICAL EXAMINATION: ECOG PERFORMANCE STATUS: 1 - Symptomatic but completely ambulatory  Vitals:   04/21/16 0920  BP: (!) 151/70  Pulse: 77  Resp: 18  Temp: 97.7 F (36.5 C)   Filed Weights   04/21/16 0920  Weight: 138 lb 9.6 oz (62.9 kg)    GENERAL:alert, no distress and comfortable SKIN: skin color, texture, turgor are normal, no rashes or significant lesions EYES: normal, Conjunctiva are  pink and non-injected, sclera clear OROPHARYNX:no exudate, no erythema and lips, buccal mucosa, and tongue normal  NECK: supple, thyroid normal size, non-tender, without nodularity LYMPH:  no palpable lymphadenopathy in the cervical, axillary or inguinal LUNGS: clear to auscultation and percussion with normal breathing effort HEART: regular rate & rhythm and no murmurs and no lower extremity edema ABDOMEN:abdomen soft, non-tender and normal bowel sounds MUSCULOSKELETAL:no cyanosis of digits and no clubbing  NEURO: alert & oriented x 3 with fluent speech, no focal motor/sensory deficits EXTREMITIES: No lower extremity edema  LABORATORY DATA:  I have reviewed the data as listed   Chemistry      Component Value Date/Time   NA 138 04/21/2016 0907   K 4.2 04/21/2016 0907   CL 99 (L) 03/28/2015 0446   CL 101 10/25/2012 0946   CO2 27 04/21/2016 0907   BUN 16.3 04/21/2016 0907   CREATININE 0.9 04/21/2016 0907      Component Value Date/Time   CALCIUM 10.2 04/21/2016 0907   ALKPHOS 88 04/21/2016 0907   AST 29 04/21/2016 0907   ALT 24 04/21/2016 0907   BILITOT 0.63 04/21/2016 0907       Lab Results  Component  Value Date   WBC 4.8 04/21/2016   HGB 14.1 04/21/2016   HCT 44.3 04/21/2016   MCV 86.1 04/21/2016   PLT 204 04/21/2016   NEUTROABS 3.6 04/21/2016    ASSESSMENT & PLAN:  Primary cancer of upper outer quadrant of right female breast ([place]) Right breast invasive ductal carcinoma ER positive PR negative HER-2 positive Ki-67 44% multifocal disease 3/7 lymph nodes positive T2 N1 M0 stage IIB status post adjuvant chemotherapy with TCH followed by Herceptin maintenance, adjuvant radiation therapy, currently on letrozole since 02/07/2013. MRI 02/14/2015: T5 large destructive lesion with pathologic compression fracture and extensive epidural tumor, involvement of T4, excision metastatic carcinoma ER 60%, PR 0%, HER-2 positive ratio 2.71 average copy #7.45 03/27/2015: T4-6 decompression surgery for spinal cord compression Radiation therapy to the spine (Dr. [name]) 04/13/2015 to 05/19/2015  Treatment plan: 1. Resumed anastrozole 05/21/2015, but it has caused significant hair loss so we switched her to exemestane 25 mg once daily 07/23/2015 2. Herceptin every 3 week started 04/10/2015 3. Obtain echocardiograms every 3 months 4. Bone metastases: Zometa every 3 months. (Change made because of diffuse bone pain related to Zometa infusions) -------------------------------------------------------------------------------------------------------------------------- Goal of treatment: Palliation Echocardiogram 10/07/2015: EF 55-60% (stable from 07/2015 but overall down from 65-70%)   Zometa toxicities: bone pain and fatigue. If it causes persistent symptoms, Zometa every 3 months.   PET-CT 04/17/16 Rt para-spinal mass mildy hypermetabolic SUV 5.9, lytic cortical lesion 4.8 cm lesion left prox femur suv 8.8 favor osseus met disease  Radiology Review: I discussed with the patient the new finding of the lesion in the left femur. I recommended palliative XRT to the lesion. I also discussed other options  including adding Palbociclib to the letrozole. Plan: 1. Obtain a biopsy of the subcutaneous nodule on the upper back 2 days for ER/PR and HER-2/neu 2. if the mass was estrogen positive then I would add Ibrance. 3. If the mass is estrogen receptor negative, then we may add an anti-HER-2 therapy to her regimen like her Perjeta.   I requested Dr. Marlou Starks to call her and set up for this biopsy.    No orders of the defined types were placed in this encounter.  The patient has a good understanding of the overall plan. she agrees with it. she will call with  any problems that may develop before the next visit here.   Rulon Eisenmenger, MD 04/21/16

## 2016-04-24 NOTE — Progress Notes (Signed)
Histology and Location of Primary Cancer: primary cancer of upper outer quadrant of right female breast with mets to bone and spine  Sites of Visceral and Bony Metastatic Disease: femur and spine  Location(s) of Symptomatic Metastases: left femur  SUMMARY OF ONCOLOGIC HISTORY:       Primary cancer of upper outer quadrant of right female breast ([name])   03/08/2012 Initial Diagnosis    Right breast invasive ductal carcinoma ER positive PR negative HER-2 positive Ki-67 44%; another breast mass biopsied in the anterior part of the breast which was also positive for malignancy that was HER-2 negative      05/10/2012 Surgery    Right mastectomy and axillary lymph node dissection: Multifocal disease 5 cm, 1.7 cm, 1.6 cm, ER positive PR negative HER-2 positive Ki-67 44%, 3/7 lymph nodes positive      06/14/2012 - 06/12/2013 Chemotherapy    Adjuvant chemotherapy with [place] 6 followed by Herceptin maintenance      10/25/2012 - 12/11/2012 Radiation Therapy    Adjuvant radiation therapy      02/07/2013 -  Anti-estrogen oral therapy    Letrozole 2.5 mg daily      02/14/2015 Imaging    MRI spine: Large destructive T5 lesion with severe pathologic compression fracture and extensive epidural tumor, severe spinal stenosis with moderate cord compression, tumor involvement of T4      03/18/2015 PET scan    Residual enhancing soft tissue adjacent to the spinal cord. No evidence of metastatic disease. Nonspecific uptake the left nipple      03/27/2015 - 03/30/2015 Hospital Admission    T4-6 decompression for spinal cord compression (lower extremity paralysis)      04/10/2015 -  Chemotherapy    Palliative treatment with Herceptin every 3 weeks with letrozole 2.5 mg daily      04/13/2015 - 05/19/2015 Radiation Therapy    Palliative radiation treatment to the spine      09/01/2015 Imaging    CT chest abdomen pelvis: Pathologic fracture with posterior fusion at T5  no other evidence of metastatic disease in the chest abdomen pelvis      12/23/2015 Imaging    CT CAP: new nonspecific 0.6 cm lymph node was stated mediastinum needs follow-up CT, innumerable tiny groundglass pulmonary nodules throughout both lungs unchanged, patchy consolidation from radiation, T5 fracture, no mets      04/15/2016 PET scan    Rt para-spinal mass mildy hypermetabolic SUV 5.9, lytic cortical lesion 4.8 cm lesion left prox femur suv 8.8 favor osseus met disease      Pain on a scale of 0-10 is: walks with cane   Ambulatory status? Walker? Wheelchair?: Ambulatory  SAFETY ISSUES:  Prior radiation? Yes   Pacemaker/ICD? no  Possible current pregnancy? no  Is the patient on methotrexate? no  Current Complaints / other details:  70 year old female. CT simulation scheduled for 0900 today.

## 2016-04-24 NOTE — Progress Notes (Signed)
  Radiation Oncology         [phone] ________________________________  Name: Heather [name] MRN: [id number]  Date: 04/25/2016  DOB: 12/14/1945  SIMULATION AND TREATMENT PLANNING NOTE    ICD-9-CM ICD-10-CM   1. Carcinoma of left breast metastatic to bone ([facility]) 174.9 C50.912    198.5 C79.51   2. Primary cancer of upper outer quadrant of right female breast ([place]) 174.4 C50.411     DIAGNOSIS:  70 yo woman with left femur metastasis  NARRATIVE:  The patient was brought to the [facility].  Identity was confirmed.  All relevant records and images related to the planned course of therapy were reviewed.  The patient freely provided informed written consent to proceed with treatment after reviewing the details related to the planned course of therapy. The consent form was witnessed and verified by the simulation staff.  Then, the patient was set-up in a stable reproducible  supine position for radiation therapy.  CT images were obtained.  Surface markings were placed.  The CT images were loaded into the planning software.  Then the target and avoidance structures were contoured.  Treatment planning then occurred.  The radiation prescription was entered and confirmed.  Then, I designed and supervised the construction of a total of 3 medically necessary complex treatment devices with BodyFix and 2 MLCs.  I have requested : Isodose Plan.   PLAN:  The patient will receive 30 Gy in 10 fractions.  ________________________________  Sheral Apley [name], M.D.

## 2016-04-25 ENCOUNTER — Encounter: Payer: Self-pay | Admitting: Radiation Oncology

## 2016-04-25 ENCOUNTER — Ambulatory Visit
Admission: RE | Admit: 2016-04-25 | Discharge: 2016-04-25 | Disposition: A | Discharge status: Home / Self Care | Payer: Medicare Other | Source: Ambulatory Visit | Attending: Radiation Oncology | Admitting: Radiation Oncology

## 2016-04-25 VITALS — BP 154/79 | HR 71 | Temp 98.0°F | Resp 16 | Ht 64.0 in | Wt 138.6 lb

## 2016-04-25 DIAGNOSIS — C50912 Malignant neoplasm of unspecified site of left female breast: Secondary | ICD-10-CM

## 2016-04-25 DIAGNOSIS — M25552 Pain in left hip: Secondary | ICD-10-CM | POA: Diagnosis not present

## 2016-04-25 DIAGNOSIS — Z881 Allergy status to other antibiotic agents status: Secondary | ICD-10-CM | POA: Diagnosis not present

## 2016-04-25 DIAGNOSIS — Z853 Personal history of malignant neoplasm of breast: Secondary | ICD-10-CM | POA: Insufficient documentation

## 2016-04-25 DIAGNOSIS — Z9011 Acquired absence of right breast and nipple: Secondary | ICD-10-CM | POA: Insufficient documentation

## 2016-04-25 DIAGNOSIS — M549 Dorsalgia, unspecified: Secondary | ICD-10-CM | POA: Diagnosis not present

## 2016-04-25 DIAGNOSIS — C7951 Secondary malignant neoplasm of bone: Secondary | ICD-10-CM

## 2016-04-25 DIAGNOSIS — Z885 Allergy status to narcotic agent status: Secondary | ICD-10-CM | POA: Insufficient documentation

## 2016-04-25 DIAGNOSIS — Z79899 Other long term (current) drug therapy: Secondary | ICD-10-CM | POA: Insufficient documentation

## 2016-04-25 DIAGNOSIS — C7952 Secondary malignant neoplasm of bone marrow: Principal | ICD-10-CM

## 2016-04-25 DIAGNOSIS — Z79811 Long term (current) use of aromatase inhibitors: Secondary | ICD-10-CM | POA: Insufficient documentation

## 2016-04-25 DIAGNOSIS — Z923 Personal history of irradiation: Secondary | ICD-10-CM | POA: Insufficient documentation

## 2016-04-25 DIAGNOSIS — Z9221 Personal history of antineoplastic chemotherapy: Secondary | ICD-10-CM | POA: Diagnosis not present

## 2016-04-25 DIAGNOSIS — Z510 Encounter for antineoplastic radiation therapy: Secondary | ICD-10-CM | POA: Diagnosis not present

## 2016-04-25 DIAGNOSIS — Z888 Allergy status to other drugs, medicaments and biological substances status: Secondary | ICD-10-CM | POA: Diagnosis not present

## 2016-04-25 DIAGNOSIS — C50411 Malignant neoplasm of upper-outer quadrant of right female breast: Secondary | ICD-10-CM

## 2016-04-25 DIAGNOSIS — C50919 Malignant neoplasm of unspecified site of unspecified female breast: Secondary | ICD-10-CM | POA: Diagnosis not present

## 2016-04-25 NOTE — Progress Notes (Signed)
See progress note under physician encounter. 

## 2016-04-25 NOTE — Progress Notes (Signed)
Radiation Oncology         [phone]) [phone] ________________________________  Name: Heather [name] MRN: [id number]  Date: 04/25/2016  DOB: Mar 08, 1946  Follow-Up Visit Note  CC: Vidal Schwalbe, MD  [name], MD  Diagnosis:   70 y.o. woman metastatic breast cancer with new left femur metastasis    ICD-9-CM ICD-10-CM   1. Carcinoma of left breast metastatic to bone (HCC) 174.9 C50.912    198.5 C79.51     Interval Since Last Radiation:  11 months   04/13/2015-05/19/2015: 55 Gy in 25 fractions of 2.2 Gy to the thoracic spine  10/25/2012 - 12/11/2012: Adjuvant radiation to the right chest wall, right supraclavicular region, and right axilla.  Narrative:  The patient returns today for a re-consultation.  The patient had a restaging PET scan on 04/15/16 a lytic cortical expansile 1.3 by 1.3 by 4.8 cm lesion in the posterior cortex of the left proximal femur with SUV max 8.8 (favoring osseous metastatic disease).  [name] recommended palliative radiation to the lesion in the left femur.  On review of systems: The patient complains of back pain and pain in the left hip. The patient describes her back pain as a burning sensation.  ALLERGIES:  is allergic to codeine; keflex [cephalexin]; naprosyn [naproxen]; oxycodone; and tussin [guaifenesin].  Meds: Current Outpatient Prescriptions  Medication Sig Dispense Refill  . b complex vitamins tablet Take 1 tablet by mouth daily.    . Calcium Carbonate Antacid (TUMS PO) Take 2 tablets by mouth 2 (two) times daily as needed (acid reflux).    . Cholecalciferol (VITAMIN D) 2000 UNITS tablet Take 2,000 Units by mouth daily.    . Coenzyme Q10 (COQ10) 100 MG CAPS Take 100 mg by mouth at bedtime.    . gabapentin (NEURONTIN) 600 MG tablet Take 1 tablet (600 mg total) by mouth as directed. Take 1 tablet in the AM and afternoon and 1 1/2 tabs at bedtime 315 tablet 2  . letrozole (FEMARA) 2.5 MG tablet Take 1 tablet (2.5 mg total) by mouth daily. 90 tablet  3  . lidocaine-prilocaine (EMLA) cream Apply to affected area once (Patient taking differently: Apply 1 application topically every 21 ( twenty-one) days. Apply to port prior to infusions) 30 g 3  . OVER THE COUNTER MEDICATION Place 1 drop into both eyes 2 (two) times daily as needed (dry eyes). Over the counter lubricating eye drops    . polyethylene glycol (MIRALAX / GLYCOLAX) packet Take 17 g by mouth 2 (two) times daily. 100 each 0  . Probiotic Product (PROBIOTIC DAILY PO) Take 1 capsule by mouth daily.     . prochlorperazine (COMPAZINE) 10 MG tablet Take 1 tablet (10 mg total) by mouth every 6 (six) hours as needed for nausea or vomiting. 30 tablet 0  . ranitidine (ZANTAC) 300 MG tablet Take 300 mg by mouth at bedtime as needed. Reported on 06/25/2015    . spironolactone (ALDACTONE) 25 MG tablet Take 0.5 tablets (12.5 mg total) by mouth daily. 45 tablet 12  . traMADol (ULTRAM) 50 MG tablet Take 1 tablet (50 mg total) by mouth every 12 (twelve) hours as needed for moderate pain. 30 tablet 0   No current facility-administered medications for this encounter.    Facility-Administered Medications Ordered in Other Encounters  Medication Dose Route Frequency Provider Last Rate Last Dose  . sodium chloride 0.9 % injection 10 mL  10 mL Intracatheter PRN [name], MD   10 mL at 01/07/16 1620  Physical Findings:  height is '5\' 4"'$  (1.626 m) and weight is 138 lb 9.6 oz (62.9 kg). Her oral temperature is 98 F (36.7 C). Her blood pressure is 154/79 (abnormal) and her pulse is 71. Her respiration is 16.   The patient is in no acute distress. Patient is alert and oriented. Ambulatory with no assistance.  Lab Findings: Lab Results  Component Value Date   WBC 4.8 04/21/2016   WBC 6.4 04/09/2015   HGB 14.1 04/21/2016   HCT 44.3 04/21/2016   PLT 204 04/21/2016    Lab Results  Component Value Date   NA 138 04/21/2016   K 4.2 04/21/2016   CHLORIDE 101 04/21/2016   CO2 27 04/21/2016   GLUCOSE  72 04/21/2016   GLUCOSE 90 10/25/2012   BUN 16.3 04/21/2016   CREATININE 0.9 04/21/2016   BILITOT 0.63 04/21/2016   ALKPHOS 88 04/21/2016   AST 29 04/21/2016   ALT 24 04/21/2016   PROT 8.1 04/21/2016   ALBUMIN 4.1 04/21/2016   CALCIUM 10.2 04/21/2016   ANIONGAP 10 04/21/2016   ANIONGAP 8 03/28/2015    Radiographic Findings: Ct Chest W Contrast  Result Date: 04/07/2016 CLINICAL DATA:  Breast cancer diagnosed 2013 and again in 2016. Oral chemotherapy. Progress. Radiation complete. EXAM: CT CHEST WITH CONTRAST TECHNIQUE: Multidetector CT imaging of the chest was performed during intravenous contrast administration. CONTRAST:  23m ISOVUE-300 IOPAMIDOL (ISOVUE-300) INJECTION 61% COMPARISON:  CT 12/23/2015 FINDINGS: Cardiovascular: Coronary artery calcification and aortic atherosclerotic calcification. No pericardial fluid. Mediastinum/Nodes: No axillary or supraclavicular adenopathy. Port in the RIGHT chest wall. No mediastinal hilar adenopathy. Within the posterior mediastinum adjacent to the descending thoracic aorta 9 mm lymph node (image 97, series 2 is increased from 6 mm. Along the lateral aspect of the RIGHT lower thoracic vertebral bodies with adjacent pleural space there is increased nodular thickening. For example 9 mm nodular thickening on image 70, series 2 and 12 mm not depicted on image 74, series 2. Lungs/Pleura: New atelectasis the lung bases. No suspicious nodularity. Mild perispinal atelectasis. Parenchymal thickening adjacent to thoracic spine surgical site Upper Abdomen: Limited view of the liver, kidneys, pancreas are unremarkable. Normal adrenal glands. Musculoskeletal: Severe compression deformity with kyphosis in the mid thoracic spine and posterior thoracic fusion. No change from prior. No evidence of new metastatic disease IMPRESSION: 1. Increase nodular thickening positioned between the RIGHT pleural space and the vertebral bodies of the lower thoracic spine as well as the  enlarging round nodule anterior to the thoracic spine at similar level. Differential would include extramedullary hematopoiesis versus breast cancer recurrence. As extramedullary hematopoiesis is typically bilateral and patient's anemia has improved favor CARCINOMA RECURRENCE. An FDG PET scan with differentiate versus biopsy 2. Stable postoperative findings of the thoracic spine. Electronically Signed   By: SSuzy BouchardM.D.   On: 04/07/2016 12:13   Nm Pet Image Restag (ps) Skull Base To Thigh  Result Date: 04/15/2016 CLINICAL DATA:  Subsequent treatment strategy for metastatic breast cancer. EXAM: NUCLEAR MEDICINE PET SKULL BASE TO THIGH TECHNIQUE: 6.7 mCi F-18 FDG was injected intravenously. Full-ring PET imaging was performed from the skull base to thigh after the radiotracer. CT data was obtained and used for attenuation correction and anatomic localization. FASTING BLOOD GLUCOSE:  Value: 118 mg/dl COMPARISON:  Multiple exams, including 03/18/2015 FINDINGS: NECK At approximately the level of the cervicothoracic junction in the posterior subcutaneous tissues, a 1.9 by 1.8 cm structure of soft tissue density has a maximum standard uptake value of 6.8.  There is previously a density in this vicinity before, even larger at 3.8 by 1.8 cm on 03/18/2015, but without high metabolic activity. There is also some accentuated metabolic activity adjacent to the top of the left side of the posterolateral rod and pedicle screw fixator. CHEST At about the T8 level in the right paraspinal/pleural region there is a 1.1 cm new rind of soft tissue density with a maximum standard uptake value of 5.9. If there is also new right upper thoracic paraspinal atelectasis or airspace opacity for example on image 19 of series 8 of the CT data, but without high of associated metabolic activity. Background mediastinal blood pool activity SUV approximately 3.8. Bandlike scarring noted in both lower lobes, left greater than right. Clips  in the right axilla. Right Port-A-Cath tip: Right atrium. Right mastectomy. 8 mm paraesophageal lymph node on image 84/4 is not discernibly hypermetabolic. ABDOMEN/PELVIS No abnormal hypermetabolic activity within the liver, pancreas, adrenal glands, or spleen. No hypermetabolic lymph nodes in the abdomen or pelvis. There is some accentuated but likely physiologic activity in the anterior pelvic bowel with some focal accentuated activity in small bowel without a definite CT correlate. SKELETON Lytic cortical expansile 1.3 by 1.3 by 4.8 cm lesion in the posterior cortex of the left proximal femur is new compared to the prior exam and hypermetabolic with maximum SUV 8.8. Today' s exam is not optimized to assess the thoracic orthopedic hardware. Nevertheless, I am suspicious that the pedicle screws at T6 and T7 are probably loose and may be capture in the lateral edges of the pedicles. Kyphosis at T5 due to vertebra plana and posterior element removal. IMPRESSION: 1. The right paraspinal mass shown on recent CT is mildly hypermetabolic, maximum SUV 5.9, favoring malignancy. 2. There is also some focal hypermetabolic activity along the nodularity in the subcutaneous tissues posterior to the upper portion of the thoracic spine hardware, maximum SUV 6.8, malignancy not excluded. 3. Lytic cortical expansile 1.3 by 1.3 by 4.8 cm lesion in the posterior cortex of the left proximal femur, new compared to prior exam, maximum SUV 8.8, favoring osseous metastatic disease. 4. I suspect that the pedicle screws at the T6 and T7 levels are probably loose and maybe capturing the lateral margins of the pedicles rather than the central pedicles. T5 vertebro plana and posterior element removal. 5. There is some new paraspinal atelectasis or scarring in the right upper lung without hypermetabolic activity. 6. Several areas of focal activity accentuation in the pelvic small bowel, probably physiologic, may warrant surveillance. The area  was normal previously. Electronically Signed   By: Van Clines M.D.   On: 04/15/2016 10:26    Impression:  69 y.o. woman with a new left femur metastasis  The patient appears to be a good candidate for palliative radiation to the left femur to prevent a pathologic fracture and reduce pain  Today, I spoke to the patient and her husband about the findings and work-up thus far.  We discussed the natural history of metastatic breast cancer to the left femur and general treatment, highlighting the role of radiotherapy in the management.  We discussed the available radiation techniques, and focused on the details of logistics and delivery.  We reviewed the anticipated acute and late sequelae associated with radiation in this setting.  The patient was encouraged to ask questions that I answered to the best of my ability. The patient would like to proceed with radiation.  Plan: The patient has been scheduled for CT simulation  today at [place]. I anticipate the patient to receive 30 Gy in 10 fractions. _____________________________________  Sheral Apley [name], M.D.  This document serves as a record of services personally performed by Tyler Pita, MD. It was created on his behalf by Darcus Austin, a trained medical scribe. The creation of this record is based on the scribe's personal observations and the provider's statements to them. This document has been checked and approved by the attending provider.

## 2016-04-26 ENCOUNTER — Ambulatory Visit: Payer: Medicare Other | Admitting: Radiation Oncology

## 2016-04-26 NOTE — Addendum Note (Signed)
Encounter addended by: Heywood Footman, RN on: 04/26/2016 10:44 AM<BR>    Actions taken: Charge Capture section accepted

## 2016-04-27 ENCOUNTER — Ambulatory Visit: Payer: Medicare Other | Admitting: Physical Medicine & Rehabilitation

## 2016-04-27 ENCOUNTER — Ambulatory Visit
Admission: RE | Admit: 2016-04-27 | Discharge: 2016-04-27 | Disposition: A | Discharge status: Home / Self Care | Payer: Medicare Other | Source: Ambulatory Visit | Attending: Radiation Oncology | Admitting: Radiation Oncology

## 2016-04-27 DIAGNOSIS — Z923 Personal history of irradiation: Secondary | ICD-10-CM | POA: Diagnosis not present

## 2016-04-27 DIAGNOSIS — M25552 Pain in left hip: Secondary | ICD-10-CM | POA: Diagnosis not present

## 2016-04-27 DIAGNOSIS — Z853 Personal history of malignant neoplasm of breast: Secondary | ICD-10-CM | POA: Diagnosis not present

## 2016-04-27 DIAGNOSIS — C7951 Secondary malignant neoplasm of bone: Secondary | ICD-10-CM | POA: Diagnosis not present

## 2016-04-27 DIAGNOSIS — M549 Dorsalgia, unspecified: Secondary | ICD-10-CM | POA: Diagnosis not present

## 2016-04-27 DIAGNOSIS — Z510 Encounter for antineoplastic radiation therapy: Secondary | ICD-10-CM | POA: Diagnosis not present

## 2016-04-28 ENCOUNTER — Ambulatory Visit
Admission: RE | Admit: 2016-04-28 | Discharge: 2016-04-28 | Disposition: A | Discharge status: Home / Self Care | Payer: Medicare Other | Source: Ambulatory Visit | Attending: Radiation Oncology | Admitting: Radiation Oncology

## 2016-04-28 VITALS — BP 156/90 | HR 75 | Resp 16 | Wt 138.4 lb

## 2016-04-28 DIAGNOSIS — Z510 Encounter for antineoplastic radiation therapy: Secondary | ICD-10-CM | POA: Diagnosis not present

## 2016-04-28 DIAGNOSIS — C7951 Secondary malignant neoplasm of bone: Secondary | ICD-10-CM | POA: Diagnosis not present

## 2016-04-28 DIAGNOSIS — M25552 Pain in left hip: Secondary | ICD-10-CM | POA: Diagnosis not present

## 2016-04-28 DIAGNOSIS — M549 Dorsalgia, unspecified: Secondary | ICD-10-CM | POA: Diagnosis not present

## 2016-04-28 DIAGNOSIS — Z853 Personal history of malignant neoplasm of breast: Secondary | ICD-10-CM | POA: Diagnosis not present

## 2016-04-28 DIAGNOSIS — C50411 Malignant neoplasm of upper-outer quadrant of right female breast: Secondary | ICD-10-CM

## 2016-04-28 DIAGNOSIS — C50912 Malignant neoplasm of unspecified site of left female breast: Secondary | ICD-10-CM

## 2016-04-28 DIAGNOSIS — Z923 Personal history of irradiation: Secondary | ICD-10-CM | POA: Diagnosis not present

## 2016-04-28 MED ORDER — RADIAPLEXRX EX GEL
Freq: Once | CUTANEOUS | Status: AC
  Administered 2016-04-28: 15:00:00 via TOPICAL

## 2016-04-28 NOTE — Progress Notes (Signed)
  Radiation Oncology         [phone]   Name: Heather [name] MRN: [id number]   Date: 04/28/2016  DOB: 03/29/1946    Weekly Radiation Therapy Management    ICD-9-CM ICD-10-CM   1. Primary cancer of upper outer quadrant of right female breast (HCC) 174.4 C50.411   2. Carcinoma of left breast metastatic to bone (HCC) 174.9 C50.912    198.5 C79.51     70 y.o. woman metastatic breast cancer with new left femur metastasis  Current Dose: 6 Gy  Planned Dose:  30 Gy  Narrative The patient presents for routine under treatment assessment.   Patient reports pain in her back and under her left breast related to effect of rod in her spine as well as discomfort in her left femur. She reports taking Tramadol and Gabapentin to manage this pain. Per nursing, there was slight edema noted bilateral lower extremities. Denies bowel complications so long as she takes Miralax daily. She reports managing her urinary leakage and hesitancy. Compression stockings noted.  The patient is without complaint. Set-up films were reviewed. The chart was checked.  Physical Findings  weight is 138 lb 6.4 oz (62.8 kg). Her blood pressure is 156/90 (abnormal) and her pulse is 75. Her respiration is 16 and oxygen saturation is 100%. . Weight essentially stable. BP is slightly elevated. No significant changes. She is ambulatory with aid of cane.  Impression The patient is tolerating radiation.  Plan Continue treatment as planned.         Sheral Apley [name], M.D.

## 2016-04-28 NOTE — Progress Notes (Signed)
Weight stable. BP slightly elevated. Reports discomfort in her left femur but, pain in her back and under her left breast related to effect of rod in her spine. Reports taking tramadol and gabapentin to manage this pain. Slight edema noted bilateral lower extremities. Compression stockings noted. Ambulatory with aid of cane. Denies bowel complications so long as she takes Miralax daily. Reports she is managing urinary leakage and hesitancy.   BP (!) 156/90 (BP Location: Left Arm, Patient Position: Sitting, Cuff Size: Normal)   Pulse 75   Resp 16   Wt 138 lb 6.4 oz (62.8 kg)   SpO2 100%   BMI 23.76 kg/m  Wt Readings from Last 3 Encounters:  04/28/16 138 lb 6.4 oz (62.8 kg)  04/25/16 138 lb 9.6 oz (62.9 kg)  04/21/16 138 lb 9.6 oz (62.9 kg)

## 2016-04-28 NOTE — Addendum Note (Signed)
Encounter addended by: Jenene Slicker, RN on: 04/28/2016  2:32 PM<BR>    Actions taken: Visit diagnoses modified, Order list changed, Diagnosis association updated

## 2016-04-28 NOTE — Addendum Note (Signed)
Encounter addended by: Jenene Slicker, RN on: 04/28/2016  2:34 PM<BR>    Actions taken: [facility] administration accepted

## 2016-04-29 ENCOUNTER — Ambulatory Visit (HOSPITAL_COMMUNITY)
Admission: RE | Admit: 2016-04-29 | Discharge: 2016-04-29 | Disposition: A | Discharge status: Home / Self Care | Payer: Medicare Other | Source: Ambulatory Visit | Attending: Family Medicine | Admitting: Family Medicine

## 2016-04-29 ENCOUNTER — Ambulatory Visit (HOSPITAL_BASED_OUTPATIENT_CLINIC_OR_DEPARTMENT_OTHER)
Admission: RE | Admit: 2016-04-29 | Discharge: 2016-04-29 | Disposition: A | Discharge status: Home / Self Care | Payer: Medicare Other | Source: Ambulatory Visit | Attending: Internal Medicine | Admitting: Internal Medicine

## 2016-04-29 ENCOUNTER — Ambulatory Visit: Payer: Medicare Other | Admitting: Radiation Oncology

## 2016-04-29 ENCOUNTER — Ambulatory Visit
Admission: RE | Admit: 2016-04-29 | Discharge: 2016-04-29 | Disposition: A | Discharge status: Home / Self Care | Payer: Medicare Other | Source: Ambulatory Visit | Attending: Radiation Oncology | Admitting: Radiation Oncology

## 2016-04-29 VITALS — BP 132/76 | HR 68 | Wt 139.5 lb

## 2016-04-29 DIAGNOSIS — I10 Essential (primary) hypertension: Secondary | ICD-10-CM | POA: Diagnosis not present

## 2016-04-29 DIAGNOSIS — C50911 Malignant neoplasm of unspecified site of right female breast: Secondary | ICD-10-CM

## 2016-04-29 DIAGNOSIS — I340 Nonrheumatic mitral (valve) insufficiency: Secondary | ICD-10-CM | POA: Insufficient documentation

## 2016-04-29 DIAGNOSIS — M25552 Pain in left hip: Secondary | ICD-10-CM | POA: Diagnosis not present

## 2016-04-29 DIAGNOSIS — M549 Dorsalgia, unspecified: Secondary | ICD-10-CM | POA: Diagnosis not present

## 2016-04-29 DIAGNOSIS — H04121 Dry eye syndrome of right lacrimal gland: Secondary | ICD-10-CM | POA: Diagnosis not present

## 2016-04-29 DIAGNOSIS — Z853 Personal history of malignant neoplasm of breast: Secondary | ICD-10-CM | POA: Diagnosis not present

## 2016-04-29 DIAGNOSIS — Z923 Personal history of irradiation: Secondary | ICD-10-CM | POA: Diagnosis not present

## 2016-04-29 DIAGNOSIS — H2511 Age-related nuclear cataract, right eye: Secondary | ICD-10-CM | POA: Diagnosis not present

## 2016-04-29 DIAGNOSIS — E785 Hyperlipidemia, unspecified: Secondary | ICD-10-CM | POA: Insufficient documentation

## 2016-04-29 DIAGNOSIS — Z0189 Encounter for other specified special examinations: Secondary | ICD-10-CM | POA: Diagnosis not present

## 2016-04-29 DIAGNOSIS — H35372 Puckering of macula, left eye: Secondary | ICD-10-CM | POA: Diagnosis not present

## 2016-04-29 DIAGNOSIS — Z510 Encounter for antineoplastic radiation therapy: Secondary | ICD-10-CM | POA: Diagnosis not present

## 2016-04-29 DIAGNOSIS — C7951 Secondary malignant neoplasm of bone: Secondary | ICD-10-CM | POA: Diagnosis not present

## 2016-04-29 DIAGNOSIS — H04122 Dry eye syndrome of left lacrimal gland: Secondary | ICD-10-CM | POA: Diagnosis not present

## 2016-04-29 NOTE — Progress Notes (Signed)
  Echocardiogram 2D Echocardiogram has been performed.  Heather [name] 04/29/2016, 11:57 AM

## 2016-04-29 NOTE — Progress Notes (Signed)
CARDIO-ONCOLOGY CLINIC NOTE  Patient ID: Heather [name], female   DOB: 07/31/1945, 70 y.o.   MRN: [id number]  Referring Oncologist: Dr [name] Oncologistt: Dr [name] PCP: Dr [name] General Surgeon: Dr Marlou Starks  HPI: Heather [name] is a 70 year old female with invasive ductal carcinoma in R breast that is ER positive and HER-2/neu positive,  S/P R mastectomy and lymph node dissection 05/10/12.    She has been treated for HTN in the past but stopped taking the medications due to elevated renal function. Intolerant Ace-I due to cough.    She received a total of 6 cycles of Taxotere/carboplatin starting on 06/14/2012 and finishing in May. XRT finished in August 2014. Now receiving Herceptin every 3 weeks to end at the end of February 2015.    She finished initial Herceptin 2015 but unfortunately then developed pathological vertebral fracture with cord compression and paralysis. Now back on Herceptin since 12/16.  Found to have metastatic lesion to left femur. Undergoing XRT and adding Ibrance. Now able to walk with walker and braces but still wobbly. Tolerating Herceptin well. No fevers or chills. No SOB or edema.     Primary cancer of upper outer quadrant of right female breast ([place])   03/08/2012 Initial Diagnosis Right breast invasive ductal carcinoma ER positive PR negative HER-2 positive Ki-67 44%; another breast mass biopsied in the anterior part of the breast which was also positive for malignancy that was HER-2 negative   05/10/2012 Surgery Right mastectomy and axillary lymph node dissection: Multifocal disease 5 cm, 1.7 cm, 1.6 cm, ER positive PR negative HER-2 positive Ki-67 44%, 3/7 lymph nodes positive   06/14/2012 - 06/12/2013 Chemotherapy Adjuvant chemotherapy with [place] 6 followed by Herceptin maintenance   10/25/2012 - 12/11/2012 Radiation Therapy Adjuvant radiation therapy   02/07/2013 -  Anti-estrogen oral therapy Letrozole 2.5 mg daily   02/14/2015 Imaging MRI spine:  Large destructive T5 lesion with severe pathologic compression fracture and extensive epidural tumor, severe spinal stenosis with moderate cord compression, tumor involvement of T4   03/18/2015 PET scan Residual enhancing soft tissue adjacent to the spinal cord. No evidence of metastatic disease. Nonspecific uptake the left nipple   03/27/2015 - 03/30/2015 Hospital Admission T4-6 decompression for spinal cord compression (lower extremity paralysis)   04/10/2015 -  Chemotherapy Palliative treatment with Herceptin every 3 weeks with letrozole 2.5 mg daily   04/13/2015 - 05/19/2015 Radiation Therapy Palliative radiation treatment to the spine   09/01/2015 Imaging CT chest abdomen pelvis: Pathologic fracture with posterior fusion at T5 no other evidence of metastatic disease in the chest abdomen pelvis         03/28/12 ECHO 60-65% Lateral S' 9.3 09/17/12 ECHO 55% lateral s' 9.4 03/05/13 ECHO EF 55% lateral s' 11.1 1/15 ECHO EF 60-65%, lateral s' 11.3, global longitudinal strain -22.9% 8/15 Echo EF 60% lateral s 11.2, GLS -25% 10/07/2015  ECHO EF 55-60% laterals s' 8.7 cm/s GLS -21.1% mild MR. Grade I DD 01/07/2016 Echo EF 55-60% GLS -21.2% Lateral s' 10.2 cm/s Mild MR/TR Grade I DD  04/29/16 Echo EF 60-65% lateral s' 9.6 cm/s GLS -17.9% (but poor tracking) If use 2 images with good tracking it is (21.4%)   Labs (1/15): K 3.9, creatinine 1.0 Labs  (7/15) K 4.2 Cr 1.2  Past Medical History:  Diagnosis Date  . Anemia    hx of  . Anxiety    r/t updated surgery  . Breast cancer ([place])    right  . Cataract  immature-not sure of which eye  . Chronic kidney disease   . Complication of anesthesia    pt states she is sensitive to meds  . Dizziness    has been going on for 69month;medical MD aware  . GERD (gastroesophageal reflux disease)    Tums prn  . H. pylori infection   . H/O hiatal hernia   . Hemorrhoids   . History of UTI   . Hx of radiation therapy 10/25/12- 12/11/12     right chest wall/regional lymph nodes 5040 cGy, 28 sessions, right chest wall boost 1000 cGy 1 session  . Hx: UTI (urinary tract infection)   . Hyperlipidemia    but not on meds;diet and exercise controlled  . Hypertension    recently started Aldactone   . Insomnia    takes Melatoniin daily  . Metastasis to spinal column (HCC)    T5  . PONV (postoperative nausea and vomiting)    pt experienced hair loss, confusion and combative- 02/15/15  . Right knee pain   . Right shoulder pain   . Skin cancer   . Urinary frequency    d/t taking Aldactone    Current Outpatient Prescriptions  Medication Sig Dispense Refill  . b complex vitamins tablet Take 1 tablet by mouth daily.    . Calcium Carbonate Antacid (TUMS PO) Take 2 tablets by mouth 2 (two) times daily as needed (acid reflux).    . Cholecalciferol (VITAMIN D) 2000 UNITS tablet Take 2,000 Units by mouth daily.    . Coenzyme Q10 (COQ10) 100 MG CAPS Take 100 mg by mouth at bedtime.    . gabapentin (NEURONTIN) 600 MG tablet Take 1 tablet (600 mg total) by mouth as directed. Take 1 tablet in the AM and afternoon and 1 1/2 tabs at bedtime 315 tablet 2  . letrozole (FEMARA) 2.5 MG tablet Take 1 tablet (2.5 mg total) by mouth daily. 90 tablet 3  . lidocaine-prilocaine (EMLA) cream Apply to affected area once (Patient taking differently: Apply 1 application topically every 21 ( twenty-one) days. Apply to port prior to infusions) 30 g 3  . OVER THE COUNTER MEDICATION Place 1 drop into both eyes 2 (two) times daily as needed (dry eyes). Over the counter lubricating eye drops    . polyethylene glycol (MIRALAX / GLYCOLAX) packet Take 17 g by mouth 2 (two) times daily. 100 each 0  . pravastatin (PRAVACHOL) 10 MG tablet Take 10 mg by mouth daily.    . Probiotic Product (PROBIOTIC DAILY PO) Take 1 capsule by mouth daily.     . prochlorperazine (COMPAZINE) 10 MG tablet Take 1 tablet (10 mg total) by mouth every 6 (six) hours as needed for nausea or  vomiting. 30 tablet 0  . ranitidine (ZANTAC) 300 MG tablet Take 300 mg by mouth at bedtime as needed. Reported on 06/25/2015    . spironolactone (ALDACTONE) 25 MG tablet Take 0.5 tablets (12.5 mg total) by mouth daily. 45 tablet 12  . traMADol (ULTRAM) 50 MG tablet Take 1 tablet (50 mg total) by mouth every 12 (twelve) hours as needed for moderate pain. 30 tablet 0   No current facility-administered medications for this encounter.    Facility-Administered Medications Ordered in Other Encounters  Medication Dose Route Frequency Provider Last Rate Last Dose  . sodium chloride 0.9 % injection 10 mL  10 mL Intracatheter PRN VNicholas Lose MD   10 mL at 01/07/16 1620     Allergies  Allergen Reactions  . Codeine     "  loopy".   02/26/15: Also patient does not want to take oxycodone either (makes her feel like a "zombie")  . Keflex [Cephalexin]     GI Upset  . Naprosyn [Naproxen] Other (See Comments)    GI ipset   . Oxycodone Other (See Comments)    Made her feel like a "zombie" 02/25/15. Patient does not want to take oxycodone.  Cecil Cranker [Guaifenesin] Other (See Comments)    Dizzy     Social History   Social History  . Marital status: Married    Spouse name: N/A  . Number of children: 2  . Years of education: N/A   Occupational History  . Not on file.   Social History Main Topics  . Smoking status: Never Smoker  . Smokeless tobacco: Never Used  . Alcohol use No  . Drug use: No  . Sexual activity: Yes    Birth control/ protection: Post-menopausal   Other Topics Concern  . Not on file   Social History Narrative  . No narrative on file    Family History  Problem Relation Age of Onset  . Leukemia Mother   . Stroke Father   . Diabetes Mellitus II Father   . Colon cancer Brother   . Cancer Brother     Prostate  . Colon cancer Maternal Grandmother     PHYSICAL EXAM: Vitals:   04/29/16 1221  BP: 132/76  Pulse: 68   General:  Well appearing. No respiratory  difficulty. Sitting in WC HEENT: normal Neck: supple. no JVD. Carotids 2+ bilat; no bruits. No lymphadenopathy or thryomegaly appreciated. Cor: PMI nondisplaced. Regular rate & rhythm. No rubs, gallops or murmurs. L port-a-cath Lungs: clear Abdomen: soft, nontender, nondistended. No hepatosplenomegaly. No bruits or masses. Good bowel sounds. Extremities: no cyanosis, clubbing, rash, no lower extremity edema. Braces on legs.  Neuro: alert & oriented x 3, cranial nerves grossly intact. moves all 4 extremities w/o difficulty. Affect pleasant.   ASSESSMENT & PLAN: 1. R breast CA with bone mets -initial Herceptin completed in 2015. -Palliative Herceptin start 12/16.Adding Ibrance.  - I reviewed the echo today.  EF and doppler parameters stable.  No CHF on exam.  - No signs of cardio-toxicity will extend screening interval to every 6 months, 2. HTN:  -BP stable, continue current regimen.    Delfino Friesen,MD 12:43 PM

## 2016-04-29 NOTE — Addendum Note (Signed)
Encounter addended by: Scarlette Calico, RN on: 04/29/2016  1:05 PM<BR>    Actions taken: Sign clinical note

## 2016-04-29 NOTE — Addendum Note (Signed)
Encounter addended by: Scarlette Calico, RN on: 04/29/2016  1:12 PM<BR>    Actions taken: Order list changed, Diagnosis association updated

## 2016-04-29 NOTE — Patient Instructions (Signed)
We will contact you in 6 months to schedule your next appointment and echocardiogram  

## 2016-05-03 ENCOUNTER — Ambulatory Visit
Admission: RE | Admit: 2016-05-03 | Discharge: 2016-05-03 | Disposition: A | Discharge status: Home / Self Care | Payer: Medicare Other | Source: Ambulatory Visit | Attending: Radiation Oncology | Admitting: Radiation Oncology

## 2016-05-03 DIAGNOSIS — C7951 Secondary malignant neoplasm of bone: Secondary | ICD-10-CM | POA: Diagnosis not present

## 2016-05-03 DIAGNOSIS — Z853 Personal history of malignant neoplasm of breast: Secondary | ICD-10-CM | POA: Diagnosis not present

## 2016-05-03 DIAGNOSIS — Z510 Encounter for antineoplastic radiation therapy: Secondary | ICD-10-CM | POA: Diagnosis not present

## 2016-05-03 DIAGNOSIS — M549 Dorsalgia, unspecified: Secondary | ICD-10-CM | POA: Diagnosis not present

## 2016-05-03 DIAGNOSIS — M25552 Pain in left hip: Secondary | ICD-10-CM | POA: Diagnosis not present

## 2016-05-03 DIAGNOSIS — Z923 Personal history of irradiation: Secondary | ICD-10-CM | POA: Diagnosis not present

## 2016-05-04 ENCOUNTER — Ambulatory Visit
Admission: RE | Admit: 2016-05-04 | Discharge: 2016-05-04 | Disposition: A | Discharge status: Home / Self Care | Payer: Medicare Other | Source: Ambulatory Visit | Attending: Radiation Oncology | Admitting: Radiation Oncology

## 2016-05-04 DIAGNOSIS — Z923 Personal history of irradiation: Secondary | ICD-10-CM | POA: Diagnosis not present

## 2016-05-04 DIAGNOSIS — C7951 Secondary malignant neoplasm of bone: Secondary | ICD-10-CM | POA: Diagnosis not present

## 2016-05-04 DIAGNOSIS — Z853 Personal history of malignant neoplasm of breast: Secondary | ICD-10-CM | POA: Diagnosis not present

## 2016-05-04 DIAGNOSIS — M549 Dorsalgia, unspecified: Secondary | ICD-10-CM | POA: Diagnosis not present

## 2016-05-04 DIAGNOSIS — M25552 Pain in left hip: Secondary | ICD-10-CM | POA: Diagnosis not present

## 2016-05-04 DIAGNOSIS — Z510 Encounter for antineoplastic radiation therapy: Secondary | ICD-10-CM | POA: Diagnosis not present

## 2016-05-05 ENCOUNTER — Ambulatory Visit
Admission: RE | Admit: 2016-05-05 | Discharge: 2016-05-05 | Disposition: A | Discharge status: Home / Self Care | Payer: Medicare Other | Source: Ambulatory Visit | Attending: Radiation Oncology | Admitting: Radiation Oncology

## 2016-05-05 DIAGNOSIS — Z853 Personal history of malignant neoplasm of breast: Secondary | ICD-10-CM | POA: Diagnosis not present

## 2016-05-05 DIAGNOSIS — Z510 Encounter for antineoplastic radiation therapy: Secondary | ICD-10-CM | POA: Diagnosis not present

## 2016-05-05 DIAGNOSIS — M549 Dorsalgia, unspecified: Secondary | ICD-10-CM | POA: Diagnosis not present

## 2016-05-05 DIAGNOSIS — Z923 Personal history of irradiation: Secondary | ICD-10-CM | POA: Diagnosis not present

## 2016-05-05 DIAGNOSIS — M25552 Pain in left hip: Secondary | ICD-10-CM | POA: Diagnosis not present

## 2016-05-05 DIAGNOSIS — C7951 Secondary malignant neoplasm of bone: Secondary | ICD-10-CM | POA: Diagnosis not present

## 2016-05-06 ENCOUNTER — Ambulatory Visit
Admission: RE | Admit: 2016-05-06 | Discharge: 2016-05-06 | Disposition: A | Discharge status: Home / Self Care | Payer: Medicare Other | Source: Ambulatory Visit | Attending: Radiation Oncology | Admitting: Radiation Oncology

## 2016-05-06 ENCOUNTER — Encounter: Payer: Self-pay | Admitting: Radiation Oncology

## 2016-05-06 VITALS — BP 144/75 | HR 80 | Temp 98.0°F | Resp 18 | Ht 64.0 in | Wt 137.8 lb

## 2016-05-06 DIAGNOSIS — C7951 Secondary malignant neoplasm of bone: Secondary | ICD-10-CM | POA: Diagnosis not present

## 2016-05-06 DIAGNOSIS — Z853 Personal history of malignant neoplasm of breast: Secondary | ICD-10-CM | POA: Diagnosis not present

## 2016-05-06 DIAGNOSIS — C50912 Malignant neoplasm of unspecified site of left female breast: Secondary | ICD-10-CM

## 2016-05-06 DIAGNOSIS — Z510 Encounter for antineoplastic radiation therapy: Secondary | ICD-10-CM | POA: Diagnosis not present

## 2016-05-06 DIAGNOSIS — M25552 Pain in left hip: Secondary | ICD-10-CM | POA: Diagnosis not present

## 2016-05-06 DIAGNOSIS — M549 Dorsalgia, unspecified: Secondary | ICD-10-CM | POA: Diagnosis not present

## 2016-05-06 DIAGNOSIS — Z923 Personal history of irradiation: Secondary | ICD-10-CM | POA: Diagnosis not present

## 2016-05-06 NOTE — Progress Notes (Signed)
  Radiation Oncology         [phone]   Name: Heather [name] MRN: [id number]   Date: 05/06/2016  DOB: 03-22-1946    Weekly Radiation Therapy Management    ICD-9-CM ICD-10-CM   1. Carcinoma of left breast metastatic to bone (HCC) 174.9 C50.912    198.5 C79.51     70 y.o. woman metastatic breast cancer with new left femur metastasis  Current Dose: 21 Gy  Planned Dose:  30 Gy  Narrative The patient presents for routine under treatment assessment.  Weight stable. BP slightly elevated, and the patient states that he BP has been running higher recently. She denies pain, and is taking Tramadol and Gabapentin to manage any pain she does experience. She also reports throbbing in the treatment area that comes and goes; she attributes this to the cancer and not the radiation treatments. Per nursing, slight edema noted in the bilateral lower extremities, and the patient is wearing compression stockings. She denies bowel complications so long as she takes Miralax daily. She reports she is managing urinary leakage and hesitancy when eliminating.  The patient is without complaint. Set-up films were reviewed. The chart was checked.  Physical Findings  height is '5\' 4"'$  (1.626 m) and weight is 137 lb 12.8 oz (62.5 kg). Her oral temperature is 98 F (36.7 C). Her blood pressure is 144/75 (abnormal) and her pulse is 80. Her respiration is 18 and oxygen saturation is 100%.  Weight essentially stable. No significant changes. She is ambulatory with aid of cane. The patient is wearing compression stockings on both legs.  Impression The patient is tolerating radiation.  Plan Continue treatment as planned. The patient will return for follow up in February. She will contact us with any questions or concerns that may arise before then. I encouraged the patient to continue to follow with Medical Oncology at their discretion.         ------------------------------------------------  Jodelle Gross, MD,  PhD  This document serves as a record of services personally performed by Kyung Rudd, MD. It was created on his behalf by Maryla Morrow, a trained medical scribe. The creation of this record is based on the scribe's personal observations and the provider's statements to them. This document has been checked and approved by the attending provider.

## 2016-05-06 NOTE — Progress Notes (Addendum)
Weight stable. BP slightly elevated. States that her blood pressure has been running higher.  Denies pain taking tramadol and gabapentin to manage this pain. Slight edema noted bilateral lower extremities wearing compression stockings. Ambulatory with a cane. Denies bowel complications so long as she takes Miralax daily. Reports she is managing urinary leakage and hesitancy ongoing.  EOT done one month follow up card given to see Shona Simpson, PA-C and skin care instructions given, see education area for documentation. Wt Readings from Last 3 Encounters:  05/06/16 137 lb 12.8 oz (62.5 kg)  04/29/16 139 lb 8 oz (63.3 kg)  04/28/16 138 lb 6.4 oz (62.8 kg)  BP (!) 144/75   Pulse 80   Temp 98 F (36.7 C) (Oral)   Resp 18   Ht '5\' 4"'$  (1.626 m)   Wt 137 lb 12.8 oz (62.5 kg)   SpO2 100%   BMI 23.65 kg/m

## 2016-05-10 ENCOUNTER — Ambulatory Visit
Admission: RE | Admit: 2016-05-10 | Discharge: 2016-05-10 | Disposition: A | Discharge status: Home / Self Care | Payer: Medicare Other | Source: Ambulatory Visit | Attending: Radiation Oncology | Admitting: Radiation Oncology

## 2016-05-10 DIAGNOSIS — Z79899 Other long term (current) drug therapy: Secondary | ICD-10-CM | POA: Diagnosis not present

## 2016-05-10 DIAGNOSIS — Z853 Personal history of malignant neoplasm of breast: Secondary | ICD-10-CM | POA: Diagnosis not present

## 2016-05-10 DIAGNOSIS — Z9011 Acquired absence of right breast and nipple: Secondary | ICD-10-CM | POA: Diagnosis not present

## 2016-05-10 DIAGNOSIS — C7951 Secondary malignant neoplasm of bone: Secondary | ICD-10-CM | POA: Diagnosis not present

## 2016-05-10 DIAGNOSIS — Z881 Allergy status to other antibiotic agents status: Secondary | ICD-10-CM | POA: Diagnosis not present

## 2016-05-10 DIAGNOSIS — Z885 Allergy status to narcotic agent status: Secondary | ICD-10-CM | POA: Diagnosis not present

## 2016-05-10 DIAGNOSIS — Z888 Allergy status to other drugs, medicaments and biological substances status: Secondary | ICD-10-CM | POA: Diagnosis not present

## 2016-05-10 DIAGNOSIS — Z9221 Personal history of antineoplastic chemotherapy: Secondary | ICD-10-CM | POA: Diagnosis not present

## 2016-05-10 DIAGNOSIS — Z923 Personal history of irradiation: Secondary | ICD-10-CM | POA: Diagnosis not present

## 2016-05-10 DIAGNOSIS — M25552 Pain in left hip: Secondary | ICD-10-CM | POA: Diagnosis not present

## 2016-05-10 DIAGNOSIS — Z510 Encounter for antineoplastic radiation therapy: Secondary | ICD-10-CM | POA: Diagnosis not present

## 2016-05-10 DIAGNOSIS — M549 Dorsalgia, unspecified: Secondary | ICD-10-CM | POA: Diagnosis not present

## 2016-05-10 DIAGNOSIS — Z79811 Long term (current) use of aromatase inhibitors: Secondary | ICD-10-CM | POA: Diagnosis not present

## 2016-05-11 ENCOUNTER — Ambulatory Visit: Payer: Medicare Other

## 2016-05-11 ENCOUNTER — Ambulatory Visit
Admission: RE | Admit: 2016-05-11 | Discharge: 2016-05-11 | Disposition: A | Discharge status: Home / Self Care | Payer: Medicare Other | Source: Ambulatory Visit | Attending: Radiation Oncology | Admitting: Radiation Oncology

## 2016-05-11 DIAGNOSIS — M549 Dorsalgia, unspecified: Secondary | ICD-10-CM | POA: Diagnosis not present

## 2016-05-11 DIAGNOSIS — C7951 Secondary malignant neoplasm of bone: Secondary | ICD-10-CM | POA: Diagnosis not present

## 2016-05-11 DIAGNOSIS — Z510 Encounter for antineoplastic radiation therapy: Secondary | ICD-10-CM | POA: Diagnosis not present

## 2016-05-11 DIAGNOSIS — Z853 Personal history of malignant neoplasm of breast: Secondary | ICD-10-CM | POA: Diagnosis not present

## 2016-05-11 DIAGNOSIS — M25552 Pain in left hip: Secondary | ICD-10-CM | POA: Diagnosis not present

## 2016-05-11 DIAGNOSIS — Z923 Personal history of irradiation: Secondary | ICD-10-CM | POA: Diagnosis not present

## 2016-05-11 NOTE — Assessment & Plan Note (Signed)
Right breast invasive ductal carcinoma ER positive PR negative HER-2 positive Ki-67 44% multifocal disease 3/7 lymph nodes positive T2 N1 M0 stage IIB status post adjuvant chemotherapy with TCH followed by Herceptin maintenance, adjuvant radiation therapy, currently on letrozole since 02/07/2013. MRI 02/14/2015: T5 large destructive lesion with pathologic compression fracture and extensive epidural tumor, involvement of T4, excision metastatic carcinoma ER 60%, PR 0%, HER-2 positive ratio 2.71 average copy #7.45 03/27/2015: T4-6 decompression surgery for spinal cord compression Radiation therapy to the spine (Dr. [name]) 04/13/2015 to 05/19/2015  Treatment plan: 1. Resumed anastrozole 05/21/2015, but it has caused significant hair loss so we switched her to exemestane 25 mg once daily 07/23/2015 2. Herceptin every 3 week started 04/10/2015 3. Obtain echocardiograms every 3 months 4. Bone metastases: Zometa every 3 months. (Change made because of diffuse bone pain related to Zometa infusions) -------------------------------------------------------------------------------------------------------------------------- Goal of treatment: Palliation Echocardiogram 10/07/2015: EF 55-60% (stable from 07/2015 but overall down from 65-70%)   Zometa toxicities: bone pain and fatigue. If it causes persistent symptoms, Zometa every 3 months.   PET-CT 04/17/16 Rt para-spinal mass mildy hypermetabolic SUV 5.9, lytic cortical lesion 4.8 cm lesion left prox femur suv 8.8 favor osseus met disease  Radiology Review: I discussed with the patient the new finding of the lesion in the left femur. I recommended palliative XRT to the lesion. I also discussed other options including adding Palbociclib to the letrozole. Plan: 1. Obtain a biopsy of the subcutaneous nodule on the upper back for ER/PR and HER-2/neu 2. if the mass was estrogen positive then I would add Ibrance. 3. If the mass is estrogen receptor  negative, then we may add an anti-HER-2 therapy to her regimen like her Perjeta.

## 2016-05-11 NOTE — Progress Notes (Signed)
Patient Care Team: [name], MD as PCP - General (Family Medicine)  DIAGNOSIS:  Encounter Diagnosis  Name Primary?  . Primary cancer of upper outer quadrant of right female breast ([place])     SUMMARY OF ONCOLOGIC HISTORY:   Primary cancer of upper outer quadrant of right female breast ([place])   03/08/2012 Initial Diagnosis    Right breast invasive ductal carcinoma ER positive PR negative HER-2 positive Ki-67 44%; another breast mass biopsied in the anterior part of the breast which was also positive for malignancy that was HER-2 negative      05/10/2012 Surgery    Right mastectomy and axillary lymph node dissection: Multifocal disease 5 cm, 1.7 cm, 1.6 cm, ER positive PR negative HER-2 positive Ki-67 44%, 3/7 lymph nodes positive      06/14/2012 - 06/12/2013 Chemotherapy    Adjuvant chemotherapy with [place] 6 followed by Herceptin maintenance      10/25/2012 - 12/11/2012 Radiation Therapy    Adjuvant radiation therapy      02/07/2013 -  Anti-estrogen oral therapy    Letrozole 2.5 mg daily      02/14/2015 Imaging    MRI spine: Large destructive T5 lesion with severe pathologic compression fracture and extensive epidural tumor, severe spinal stenosis with moderate cord compression, tumor involvement of T4      03/18/2015 PET scan    Residual enhancing soft tissue adjacent to the spinal cord. No evidence of metastatic disease. Nonspecific uptake the left nipple      03/27/2015 - 03/30/2015 Hospital Admission    T4-6 decompression for spinal cord compression (lower extremity paralysis)      04/10/2015 -  Chemotherapy    Palliative treatment with Herceptin every 3 weeks with letrozole 2.5 mg daily      04/13/2015 - 05/19/2015 Radiation Therapy    Palliative radiation treatment to the spine      09/01/2015 Imaging    CT chest abdomen pelvis: Pathologic fracture with posterior fusion at T5 no other evidence of metastatic disease in the chest abdomen pelvis      12/23/2015 Imaging      CT CAP: new nonspecific 0.6 cm lymph node was stated mediastinum needs follow-up CT, innumerable tiny groundglass pulmonary nodules throughout both lungs unchanged, patchy consolidation from radiation, T5 fracture, no mets      04/15/2016 PET scan    Rt para-spinal mass mildy hypermetabolic SUV 5.9, lytic cortical lesion 4.8 cm lesion left prox femur suv 8.8 favor osseus met disease      04/28/2016 - 05/12/2016 Radiation Therapy    Palliative XRT Left femur       CHIEF COMPLIANT: Currently on Pall XRT to femur  INTERVAL HISTORY: Heather [name] is a 71 year old with above-mentioned history of metastatic breast cancer with cord compression who underwent a recent PET CT scan which suggested that they were new metastatic disease in her femur. Shes currently undergoing pall XRT to left femur. There was also a subcutaneous lesion on the back of her neck. She hasnt seen dr.Toth for a biopsy. Pain in the leg is much improved.  REVIEW OF SYSTEMS:   Constitutional: Denies fevers, chills or abnormal weight loss Eyes: Denies blurriness of vision Ears, nose, mouth, throat, and face: Denies mucositis or sore throat Respiratory: Denies cough, dyspnea or wheezes Cardiovascular: Denies palpitation, chest discomfort Gastrointestinal:  Denies nausea, heartburn or change in bowel habits Skin: Denies abnormal skin rashes Lymphatics: Denies new lymphadenopathy or easy bruising Neurological: Back pain related to prior compression  fracture Behavioral/Psych: Mood is stable, no new changes  Extremities: No lower extremity edema Breast:  denies any pain or lumps or nodules in either breasts All other systems were reviewed with the patient and are negative.  I have reviewed the past medical history, past surgical history, social history and family history with the patient and they are unchanged from previous note.  ALLERGIES:  is allergic to codeine; keflex [cephalexin]; naprosyn [naproxen]; oxycodone; and  tussin [guaifenesin].  MEDICATIONS:  Current Outpatient Prescriptions  Medication Sig Dispense Refill  . b complex vitamins tablet Take 1 tablet by mouth daily.    . Calcium Carbonate Antacid (TUMS PO) Take 2 tablets by mouth 2 (two) times daily as needed (acid reflux).    . Cholecalciferol (VITAMIN D) 2000 UNITS tablet Take 2,000 Units by mouth daily.    . Coenzyme Q10 (COQ10) 100 MG CAPS Take 100 mg by mouth at bedtime.    . gabapentin (NEURONTIN) 600 MG tablet Take 1 tablet (600 mg total) by mouth as directed. Take 1 tablet in the AM and afternoon and 1 1/2 tabs at bedtime 315 tablet 2  . letrozole (FEMARA) 2.5 MG tablet Take 1 tablet (2.5 mg total) by mouth daily. 90 tablet 3  . lidocaine-prilocaine (EMLA) cream Apply to affected area once (Patient taking differently: Apply 1 application topically every 21 ( twenty-one) days. Apply to port prior to infusions) 30 g 3  . OVER THE COUNTER MEDICATION Place 1 drop into both eyes 2 (two) times daily as needed (dry eyes). Over the counter lubricating eye drops    . polyethylene glycol (MIRALAX / GLYCOLAX) packet Take 17 g by mouth 2 (two) times daily. (Patient not taking: Reported on 05/06/2016) 100 each 0  . pravastatin (PRAVACHOL) 10 MG tablet Take 10 mg by mouth daily.    . Probiotic Product (PROBIOTIC DAILY PO) Take 1 capsule by mouth daily.     . prochlorperazine (COMPAZINE) 10 MG tablet Take 1 tablet (10 mg total) by mouth every 6 (six) hours as needed for nausea or vomiting. (Patient not taking: Reported on 05/06/2016) 30 tablet 0  . ranitidine (ZANTAC) 300 MG tablet Take 300 mg by mouth at bedtime as needed. Reported on 06/25/2015    . spironolactone (ALDACTONE) 25 MG tablet Take 0.5 tablets (12.5 mg total) by mouth daily. 45 tablet 12  . traMADol (ULTRAM) 50 MG tablet Take 1 tablet (50 mg total) by mouth every 12 (twelve) hours as needed for moderate pain. (Patient not taking: Reported on 05/06/2016) 30 tablet 0   No current  facility-administered medications for this visit.    Facility-Administered Medications Ordered in Other Visits  Medication Dose Route Frequency Provider Last Rate Last Dose  . heparin lock flush 100 unit/mL  500 Units Intracatheter Once PRN [name], MD      . sodium chloride 0.9 % injection 10 mL  10 mL Intracatheter PRN [name], MD   10 mL at 01/07/16 1620  . sodium chloride 0.9 % injection 10 mL  10 mL Intracatheter PRN [name], MD      . trastuzumab (HERCEPTIN) 399 mg in sodium chloride 0.9 % 250 mL chemo infusion  6 mg/kg (Order-Specific) Intravenous Once [name], MD      . Zoledronic Acid (ZOMETA) 4 mg IVPB  4 mg Intravenous Once [name], MD        PHYSICAL EXAMINATION: ECOG PERFORMANCE STATUS: 1 - Symptomatic but completely ambulatory  Vitals:   05/12/16 0851  BP: Marland Kitchen)  159/75  Pulse: 74  Resp: 18  Temp: 97.7 F (36.5 C)   Filed Weights   05/12/16 0851  Weight: 139 lb 6.4 oz (63.2 kg)    GENERAL:alert, no distress and comfortable SKIN: skin color, texture, turgor are normal, no rashes or significant lesions EYES: normal, Conjunctiva are pink and non-injected, sclera clear OROPHARYNX:no exudate, no erythema and lips, buccal mucosa, and tongue normal  NECK: supple, thyroid normal size, non-tender, without nodularity LYMPH:  no palpable lymphadenopathy in the cervical, axillary or inguinal LUNGS: clear to auscultation and percussion with normal breathing effort HEART: regular rate & rhythm and no murmurs and no lower extremity edema ABDOMEN:abdomen soft, non-tender and normal bowel sounds MUSCULOSKELETAL:no cyanosis of digits and no clubbing  NEURO: alert & oriented x 3 with fluent speech, no focal motor/sensory deficits EXTREMITIES: No lower extremity edema  LABORATORY DATA:  I have reviewed the data as listed   Chemistry      Component Value Date/Time   NA 138 05/12/2016 0815   K 4.0 05/12/2016 0815   CL 99 (L) 03/28/2015 0446   CL 101  10/25/2012 0946   CO2 27 05/12/2016 0815   BUN 17.3 05/12/2016 0815   CREATININE 0.8 05/12/2016 0815      Component Value Date/Time   CALCIUM 10.1 05/12/2016 0815   ALKPHOS 74 05/12/2016 0815   AST 26 05/12/2016 0815   ALT 21 05/12/2016 0815   BILITOT 0.55 05/12/2016 0815       Lab Results  Component Value Date   WBC 4.3 05/12/2016   HGB 13.1 05/12/2016   HCT 40.0 05/12/2016   MCV 85.1 05/12/2016   PLT 188 05/12/2016   NEUTROABS 3.1 05/12/2016    ASSESSMENT & PLAN:  Primary cancer of upper outer quadrant of right female breast ([place]) Right breast invasive ductal carcinoma ER positive PR negative HER-2 positive Ki-67 44% multifocal disease 3/7 lymph nodes positive T2 N1 M0 stage IIB status post adjuvant chemotherapy with TCH followed by Herceptin maintenance, adjuvant radiation therapy, currently on letrozole since 02/07/2013. MRI 02/14/2015: T5 large destructive lesion with pathologic compression fracture and extensive epidural tumor, involvement of T4, excision metastatic carcinoma ER 60%, PR 0%, HER-2 positive ratio 2.71 average copy #7.45 03/27/2015: T4-6 decompression surgery for spinal cord compression Radiation therapy to the spine (Dr. [name]) 04/13/2015 to 05/19/2015  Treatment plan: 1. Resumed anastrozole 05/21/2015, but it has caused significant hair loss so we switched her to exemestane 25 mg once daily 07/23/2015 2. Herceptin every 3 week started 04/10/2015 3. Obtain echocardiograms every 3 months 4. Bone metastases: Zometa every 3 months. (Change made because of diffuse bone pain related to Zometa infusions) -------------------------------------------------------------------------------------------------------------------------- Goal of treatment: Palliation Echocardiogram 10/07/2015: EF 55-60% (stable from 07/2015 but overall down from 65-70%)   Zometa toxicities: bone pain and fatigue. If it causes persistent symptoms, Zometa every 3 months.   PET-CT  04/17/16 Rt para-spinal mass mildy hypermetabolic SUV 5.9, lytic cortical lesion 4.8 cm lesion left prox femur suv 8.8 favor osseus met disease  Radiology Review: I discussed with the patient the new finding of the lesion in the left femur. I recommended palliative XRT to the lesion. I also discussed other options including adding Palbociclib to the letrozole. Plan: 1. Obtain a biopsy of the subcutaneous nodule on the upper back for ER/PR and HER-2/neu 2. if the mass was estrogen positive then I would add Ibrance. 3. If the mass is estrogen receptor negative, then we may add an anti-HER-2 therapy to her regimen  like her Perjeta.   Patient has not had an appointment to see Dr. Marlou Starks or Dr. Kathyrn Sheriff. I will call their offices to request them to see her We discussed with the patient the risks and benefits of Ibrance as well as Perjeta  No orders of the defined types were placed in this encounter.  The patient has a good understanding of the overall plan. she agrees with it. she will call with any problems that may develop before the next visit here.   Rulon Eisenmenger, MD 05/12/16

## 2016-05-12 ENCOUNTER — Ambulatory Visit (HOSPITAL_BASED_OUTPATIENT_CLINIC_OR_DEPARTMENT_OTHER): Payer: Medicare Other | Admitting: *Deleted

## 2016-05-12 ENCOUNTER — Encounter: Payer: Self-pay | Admitting: Hematology and Oncology

## 2016-05-12 ENCOUNTER — Other Ambulatory Visit: Payer: Self-pay | Admitting: Emergency Medicine

## 2016-05-12 ENCOUNTER — Ambulatory Visit (HOSPITAL_BASED_OUTPATIENT_CLINIC_OR_DEPARTMENT_OTHER): Payer: Medicare Other

## 2016-05-12 ENCOUNTER — Ambulatory Visit (HOSPITAL_BASED_OUTPATIENT_CLINIC_OR_DEPARTMENT_OTHER): Payer: Medicare Other | Admitting: Hematology and Oncology

## 2016-05-12 ENCOUNTER — Encounter: Payer: Self-pay | Admitting: Radiation Oncology

## 2016-05-12 ENCOUNTER — Ambulatory Visit
Admission: RE | Admit: 2016-05-12 | Discharge: 2016-05-12 | Disposition: A | Discharge status: Home / Self Care | Payer: Medicare Other | Source: Ambulatory Visit | Attending: Radiation Oncology | Admitting: Radiation Oncology

## 2016-05-12 DIAGNOSIS — Z170 Estrogen receptor positive status [ER+]: Secondary | ICD-10-CM

## 2016-05-12 DIAGNOSIS — Z923 Personal history of irradiation: Secondary | ICD-10-CM | POA: Diagnosis not present

## 2016-05-12 DIAGNOSIS — C50112 Malignant neoplasm of central portion of left female breast: Secondary | ICD-10-CM

## 2016-05-12 DIAGNOSIS — C7951 Secondary malignant neoplasm of bone: Secondary | ICD-10-CM

## 2016-05-12 DIAGNOSIS — M549 Dorsalgia, unspecified: Secondary | ICD-10-CM | POA: Diagnosis not present

## 2016-05-12 DIAGNOSIS — C773 Secondary and unspecified malignant neoplasm of axilla and upper limb lymph nodes: Secondary | ICD-10-CM | POA: Diagnosis not present

## 2016-05-12 DIAGNOSIS — C50411 Malignant neoplasm of upper-outer quadrant of right female breast: Secondary | ICD-10-CM | POA: Diagnosis not present

## 2016-05-12 DIAGNOSIS — C50911 Malignant neoplasm of unspecified site of right female breast: Secondary | ICD-10-CM

## 2016-05-12 DIAGNOSIS — Z5112 Encounter for antineoplastic immunotherapy: Secondary | ICD-10-CM | POA: Diagnosis present

## 2016-05-12 DIAGNOSIS — Z452 Encounter for adjustment and management of vascular access device: Secondary | ICD-10-CM | POA: Diagnosis not present

## 2016-05-12 DIAGNOSIS — M25552 Pain in left hip: Secondary | ICD-10-CM | POA: Diagnosis not present

## 2016-05-12 DIAGNOSIS — Z510 Encounter for antineoplastic radiation therapy: Secondary | ICD-10-CM | POA: Diagnosis not present

## 2016-05-12 DIAGNOSIS — Z853 Personal history of malignant neoplasm of breast: Secondary | ICD-10-CM | POA: Diagnosis not present

## 2016-05-12 LAB — CBC WITH DIFFERENTIAL/PLATELET
BASO%: 1.1 % (ref 0.0–2.0)
Basophils Absolute: 0 10*3/uL (ref 0.0–0.1)
EOS%: 1.1 % (ref 0.0–7.0)
Eosinophils Absolute: 0 10*3/uL (ref 0.0–0.5)
HCT: 40 % (ref 34.8–46.6)
HGB: 13.1 g/dL (ref 11.6–15.9)
LYMPH%: 17.1 % (ref 14.0–49.7)
MCH: 27.9 pg (ref 25.1–34.0)
MCHC: 32.8 g/dL (ref 31.5–36.0)
MCV: 85.1 fL (ref 79.5–101.0)
MONO#: 0.4 10*3/uL (ref 0.1–0.9)
MONO%: 9.4 % (ref 0.0–14.0)
NEUT#: 3.1 10*3/uL (ref 1.5–6.5)
NEUT%: 71.3 % (ref 38.4–76.8)
Platelets: 188 10*3/uL (ref 145–400)
RBC: 4.7 10*6/uL (ref 3.70–5.45)
RDW: 13.7 % (ref 11.2–14.5)
WBC: 4.3 10*3/uL (ref 3.9–10.3)
lymph#: 0.7 10*3/uL — ABNORMAL LOW (ref 0.9–3.3)

## 2016-05-12 LAB — COMPREHENSIVE METABOLIC PANEL
ALT: 21 U/L (ref 0–55)
AST: 26 U/L (ref 5–34)
Albumin: 4.1 g/dL (ref 3.5–5.0)
Alkaline Phosphatase: 74 U/L (ref 40–150)
Anion Gap: 10 mEq/L (ref 3–11)
BUN: 17.3 mg/dL (ref 7.0–26.0)
CO2: 27 mEq/L (ref 22–29)
Calcium: 10.1 mg/dL (ref 8.4–10.4)
Chloride: 101 mEq/L (ref 98–109)
Creatinine: 0.8 mg/dL (ref 0.6–1.1)
EGFR: 74 mL/min/{1.73_m2} — ABNORMAL LOW (ref >90)
Glucose: 77 mg/dl (ref 70–140)
Potassium: 4 mEq/L (ref 3.5–5.1)
Sodium: 138 mEq/L (ref 136–145)
Total Bilirubin: 0.55 mg/dL (ref 0.20–1.20)
Total Protein: 7.3 g/dL (ref 6.4–8.3)

## 2016-05-12 MED ORDER — ACETAMINOPHEN 325 MG PO TABS
650.0000 mg | ORAL_TABLET | Freq: Once | ORAL | Status: AC
  Administered 2016-05-12: 650 mg via ORAL

## 2016-05-12 MED ORDER — SODIUM CHLORIDE 0.9 % IJ SOLN
10.0000 mL | INTRAMUSCULAR | Status: DC | PRN
  Administered 2016-05-12: 10 mL
  Filled 2016-05-12: qty 10

## 2016-05-12 MED ORDER — ZOLEDRONIC ACID 4 MG/100ML IV SOLN
4.0000 mg | Freq: Once | INTRAVENOUS | Status: AC
  Administered 2016-05-12: 4 mg via INTRAVENOUS
  Filled 2016-05-12: qty 100

## 2016-05-12 MED ORDER — SODIUM CHLORIDE 0.9 % IJ SOLN
10.0000 mL | INTRAMUSCULAR | Status: DC | PRN
  Administered 2016-05-12: 10 mL via INTRAVENOUS
  Filled 2016-05-12: qty 10

## 2016-05-12 MED ORDER — HEPARIN SOD (PORK) LOCK FLUSH 100 UNIT/ML IV SOLN
500.0000 [IU] | Freq: Once | INTRAVENOUS | Status: AC | PRN
  Administered 2016-05-12: 500 [IU]
  Filled 2016-05-12: qty 5

## 2016-05-12 MED ORDER — ACETAMINOPHEN 325 MG PO TABS
ORAL_TABLET | ORAL | Status: AC
  Filled 2016-05-12: qty 2

## 2016-05-12 MED ORDER — SODIUM CHLORIDE 0.9 % IV SOLN
Freq: Once | INTRAVENOUS | Status: AC
  Administered 2016-05-12: 10:00:00 via INTRAVENOUS

## 2016-05-12 MED ORDER — SODIUM CHLORIDE 0.9 % IV SOLN
6.0000 mg/kg | Freq: Once | INTRAVENOUS | Status: AC
  Administered 2016-05-12: 399 mg via INTRAVENOUS
  Filled 2016-05-12: qty 19

## 2016-05-12 NOTE — Patient Instructions (Signed)
[facility] Discharge Instructions for Patients Receiving Chemotherapy  Today you received the following chemotherapy agents: Herceptin   To help prevent nausea and vomiting after your treatment, we encourage you to take your nausea medication as directed.    If you develop nausea and vomiting that is not controlled by your nausea medication, call the clinic.   BELOW ARE SYMPTOMS THAT SHOULD BE REPORTED IMMEDIATELY:  *FEVER GREATER THAN 100.5 F  *CHILLS WITH OR WITHOUT FEVER  NAUSEA AND VOMITING THAT IS NOT CONTROLLED WITH YOUR NAUSEA MEDICATION  *UNUSUAL SHORTNESS OF BREATH  *UNUSUAL BRUISING OR BLEEDING  TENDERNESS IN MOUTH AND THROAT WITH OR WITHOUT PRESENCE OF ULCERS  *URINARY PROBLEMS  *BOWEL PROBLEMS  UNUSUAL RASH Items with * indicate a potential emergency and should be followed up as soon as possible.  Feel free to call the clinic you have any questions or concerns. The clinic phone number is [phone].  Please show the CHEMO ALERT CARD at check-in to the Emergency Department and triage nurse.   

## 2016-05-12 NOTE — Progress Notes (Signed)
  Radiation Oncology         [phone] ________________________________  Name: Heather [name] MRN: [id number]  Date: 05/12/2016  DOB: 1945-10-23  End of Treatment Note  Diagnosis:    71 y.o.woman metastatic breastcancerwith new left femur metastasis    Indication for treatment:  Palliative      Radiation treatment dates:   04/27/16-05/12/16  Site/dose:  Left femur/ 30 Gy in 10 fractions  Beams/energy:   Isodose plan/ 10X, 6X  Narrative: The patient tolerated radiation treatment relatively well. During treatment, the patient noted a throbbing pain in the treatment area and slight edema in bilateral lower extremities.  Plan: The patient has completed radiation treatment. The patient will return to radiation oncology clinic for routine followup in one month. I advised her to call or return sooner if she has any questions or concerns related to her recovery or treatment. ________________________________  Sheral Apley. [name], M.D.   This document serves as a record of services personally performed by Tyler Pita, MD. It was created on his behalf by Bethann Humble, a trained medical scribe. The creation of this record is based on the scribe's personal observations and the provider's statements to them. This document has been checked and approved by the attending provider.

## 2016-05-13 ENCOUNTER — Telehealth: Payer: Self-pay | Admitting: *Deleted

## 2016-05-13 ENCOUNTER — Ambulatory Visit: Payer: Self-pay | Admitting: General Surgery

## 2016-05-13 DIAGNOSIS — C50911 Malignant neoplasm of unspecified site of right female breast: Secondary | ICD-10-CM | POA: Diagnosis not present

## 2016-05-13 NOTE — Telephone Encounter (Signed)
Error

## 2016-05-16 ENCOUNTER — Encounter (HOSPITAL_BASED_OUTPATIENT_CLINIC_OR_DEPARTMENT_OTHER)
Admission: RE | Disposition: A | Discharge status: Home / Self Care | Payer: Self-pay | Source: Ambulatory Visit | Attending: General Surgery

## 2016-05-16 ENCOUNTER — Ambulatory Visit (HOSPITAL_BASED_OUTPATIENT_CLINIC_OR_DEPARTMENT_OTHER)
Admission: RE | Admit: 2016-05-16 | Discharge: 2016-05-16 | Disposition: A | Discharge status: Home / Self Care | Payer: Medicare Other | Source: Ambulatory Visit | Attending: General Surgery | Admitting: General Surgery

## 2016-05-16 ENCOUNTER — Encounter (HOSPITAL_BASED_OUTPATIENT_CLINIC_OR_DEPARTMENT_OTHER): Payer: Self-pay | Admitting: Anesthesiology

## 2016-05-16 DIAGNOSIS — C7989 Secondary malignant neoplasm of other specified sites: Secondary | ICD-10-CM | POA: Diagnosis not present

## 2016-05-16 DIAGNOSIS — C792 Secondary malignant neoplasm of skin: Secondary | ICD-10-CM | POA: Diagnosis not present

## 2016-05-16 DIAGNOSIS — C7951 Secondary malignant neoplasm of bone: Secondary | ICD-10-CM | POA: Insufficient documentation

## 2016-05-16 DIAGNOSIS — E7800 Pure hypercholesterolemia, unspecified: Secondary | ICD-10-CM | POA: Insufficient documentation

## 2016-05-16 DIAGNOSIS — Z853 Personal history of malignant neoplasm of breast: Secondary | ICD-10-CM | POA: Insufficient documentation

## 2016-05-16 DIAGNOSIS — K219 Gastro-esophageal reflux disease without esophagitis: Secondary | ICD-10-CM | POA: Diagnosis not present

## 2016-05-16 DIAGNOSIS — Z79899 Other long term (current) drug therapy: Secondary | ICD-10-CM | POA: Insufficient documentation

## 2016-05-16 DIAGNOSIS — R222 Localized swelling, mass and lump, trunk: Secondary | ICD-10-CM | POA: Diagnosis present

## 2016-05-16 HISTORY — PX: MINOR BREAST BIOPSY: SHX5977

## 2016-05-16 SURGERY — MINOR BREAST BIOPSY
Anesthesia: LOCAL | Site: Back | Laterality: Left

## 2016-05-16 MED ORDER — CHLORHEXIDINE GLUCONATE CLOTH 2 % EX PADS: 6.0000 | MEDICATED_PAD | Freq: Once | CUTANEOUS | Status: DC

## 2016-05-16 MED ORDER — BUPIVACAINE HCL (PF) 0.25 % IJ SOLN
INTRAMUSCULAR | Status: DC | PRN
  Administered 2016-05-16: 9.5 mL

## 2016-05-16 MED ORDER — LIDOCAINE HCL (PF) 1 % IJ SOLN
INTRAMUSCULAR | Status: DC | PRN
  Administered 2016-05-16: 9.5 mL

## 2016-05-16 MED ORDER — TRAMADOL HCL 50 MG PO TABS: 50.0000 mg | ORAL_TABLET | Freq: Four times a day (QID) | ORAL | 1 refills | Status: DC | PRN

## 2016-05-16 SURGICAL SUPPLY — 27 items
BLADE SURG 15 STRL LF DISP TIS (BLADE) ×1 IMPLANT
BLADE SURG 15 STRL SS (BLADE) ×1
CHLORAPREP W/TINT 26ML (MISCELLANEOUS) ×2 IMPLANT
DECANTER SPIKE VIAL GLASS SM (MISCELLANEOUS) IMPLANT
DERMABOND ADVANCED (GAUZE/BANDAGES/DRESSINGS) ×1
DERMABOND ADVANCED .7 DNX12 (GAUZE/BANDAGES/DRESSINGS) ×1 IMPLANT
DRAPE UTILITY XL STRL (DRAPES) ×2 IMPLANT
ELECT REM PT RETURN 9FT ADLT (ELECTROSURGICAL) ×2
ELECTRODE REM PT RTRN 9FT ADLT (ELECTROSURGICAL) ×1 IMPLANT
GAUZE SPONGE 4X4 16PLY XRAY LF (GAUZE/BANDAGES/DRESSINGS) ×2 IMPLANT
GLOVE BIO SURGEON STRL SZ7.5 (GLOVE) ×2 IMPLANT
GLOVE BIOGEL PI IND STRL 7.0 (GLOVE) ×2 IMPLANT
GLOVE BIOGEL PI INDICATOR 7.0 (GLOVE) ×2
GOWN STRL REUS W/ TWL LRG LVL3 (GOWN DISPOSABLE) ×2 IMPLANT
GOWN STRL REUS W/TWL LRG LVL3 (GOWN DISPOSABLE) ×2
MARKER SKIN DUAL TIP RULER LAB (MISCELLANEOUS) ×2 IMPLANT
NDL SAFETY ECLIPSE 18X1.5 (NEEDLE) ×1 IMPLANT
NEEDLE HYPO 18GX1.5 SHARP (NEEDLE) ×1
NEEDLE HYPO 25X1 1.5 SAFETY (NEEDLE) ×2 IMPLANT
PENCIL BUTTON HOLSTER BLD 10FT (ELECTRODE) ×2 IMPLANT
SUT MON AB 4-0 PC3 18 (SUTURE) ×2 IMPLANT
SUT VIC AB 3-0 SH 27 (SUTURE) ×2
SUT VIC AB 3-0 SH 27X BRD (SUTURE) ×2 IMPLANT
SYR CONTROL 10ML LL (SYRINGE) ×2 IMPLANT
TOWEL OR NON WOVEN STRL DISP B (DISPOSABLE) ×2 IMPLANT
TUBE CONNECTING 20X1/4 (TUBING) ×2 IMPLANT
YANKAUER SUCT BULB TIP 10FT TU (MISCELLANEOUS) ×2 IMPLANT

## 2016-05-16 NOTE — H&P (Signed)
Heather [name]  Location: [facility] Surgery Patient #: [id number] DOB: 10-22-1945 Married / Language: English / Race: White Female   History of Present Illness  Patient words: back mass.  The patient is a 71 year old female who presents for a follow-up for Breast cancer. The patient is a 71 year old white female who is 4 years status post right modified radical mastectomy for a right sided breast cancer. She was ER positive and HER-2 positive. Since that time she has developed metastatic disease at T4-5. She has also developed a new nodule at the upper portion of her back incision her oncologist would like this biopsied in order to help direct future therapy.   Problem List/Past Medical  PRIMARY CANCER OF RIGHT FEMALE BREAST (C50.911)   Past Surgical History  Breast Biopsy  Right. Breast Mass; Local Excision  Left. Mastectomy  Right.  Diagnostic Studies History  Colonoscopy  5-10 years ago Mammogram  within last year Pap Smear  1-5 years ago  Allergies  Keflex *CEPHALOSPORINS*  Naprosyn *ANALGESICS - ANTI-INFLAMMATORY*  Tussin *COUGH/COLD/ALLERGY*   Medication History  Amoxicillin (500MG Tablet, Oral) Active. Pantoprazole Sodium (40MG Tablet DR, Oral) Active. Clarithromycin (500MG Tablet, Oral) Active. Azithromycin (250MG Tablet, Oral) Active. Letrozole (2.5MG Tablet, Oral) Active. Pravastatin Sodium (10MG Tablet, Oral) Active. Spironolactone (25MG Tablet, Oral) Active. Magonate (1000 (54 Mg)MG/5ML Liquid, Oral) Active.  Social History No alcohol use  No caffeine use  No drug use  Tobacco use  Never smoker.  Family History Cerebrovascular Accident  Father. Diabetes Mellitus  Father. Prostate Cancer  Brother.  Pregnancy / Birth History Age at menarche  10 years. Age of menopause  31-60 Gravida  2 Maternal age  21-30 Para  2  Other Problems  Breast Cancer  Gastroesophageal Reflux Disease  High blood pressure   Hypercholesterolemia  Lump In Breast     Review of Systems  General Not Present- Appetite Loss, Chills, Fatigue, Fever, Night Sweats, Weight Gain and Weight Loss. Skin Not Present- Change in Wart/Mole, Dryness, Hives, Jaundice, New Lesions, Non-Healing Wounds, Rash and Ulcer. HEENT Present- Ringing in the Ears and Wears glasses/contact lenses. Not Present- Earache, Hearing Loss, Hoarseness, Nose Bleed, Oral Ulcers, Seasonal Allergies, Sinus Pain, Sore Throat, Visual Disturbances and Yellow Eyes. Respiratory Not Present- Bloody sputum, Chronic Cough, Difficulty Breathing, Snoring and Wheezing. Gastrointestinal Present- Indigestion. Not Present- Abdominal Pain, Bloating, Bloody Stool, Change in Bowel Habits, Chronic diarrhea, Constipation, Difficulty Swallowing, Excessive gas, Gets full quickly at meals, Hemorrhoids, Nausea, Rectal Pain and Vomiting. Female Genitourinary Present- Pelvic Pain. Not Present- Frequency, Nocturia, Painful Urination and Urgency. Musculoskeletal Present- Back Pain, Leg Weakness, Muscle Pain and Muscle Weakness. Not Present- Joint Pain, Joint Stiffness and Swelling of Extremities. Neurological Present- Trouble walking. Not Present- Decreased Memory, Fainting, Headaches, Numbness, Seizures, Tingling, Tremor and Weakness. Psychiatric Not Present- Anxiety, Bipolar, Change in Sleep Pattern, Depression, Fearful and Frequent crying. Endocrine Not Present- Cold Intolerance, Excessive Hunger, Hair Changes, Heat Intolerance, Hot flashes and New Diabetes. Hematology Not Present- Easy Bruising, Excessive bleeding, Gland problems, HIV and Persistent Infections.  Vitals Weight: 139 lb Height: 64in Body Surface Area: 1.68 m Body Mass Index: 23.86 kg/m  BP: 130/82 (Sitting, Left Arm, Standard)       Physical Exam General Mental Status-Alert. General Appearance-Consistent with stated age. Hydration-Well hydrated. Voice-Normal.  Head and  Neck Head-normocephalic, atraumatic with no lesions or palpable masses. Trachea-midline. Thyroid Gland Characteristics - normal size and consistency.  Eye Eyeball - Bilateral-Extraocular movements intact. Sclera/Conjunctiva - Bilateral-No scleral icterus.  Chest and Lung Exam Chest and lung exam reveals -quiet, even and easy respiratory effort with no use of accessory muscles and on auscultation, normal breath sounds, no adventitious sounds and normal vocal resonance. Inspection Chest Wall - Normal. Back - normal. Note: There is a 1.5-2 cm nodule at the upper mid back near the top of her previous incision. The nodule is mobile. There are no overlying skin changes.   Breast Note: The right mastectomy incision has healed nicely with no sign of infection or seroma. The skin flaps are healthy. There is no palpable mass of the right chest wall. There is no palpable mass of the left breast. There is no palpable axillary, supraclavicular, or cervical lymphadenopathy.   Cardiovascular Cardiovascular examination reveals -normal heart sounds, regular rate and rhythm with no murmurs and normal pedal pulses bilaterally.  Abdomen Inspection Inspection of the abdomen reveals - No Hernias. Skin - Scar - no surgical scars. Palpation/Percussion Palpation and Percussion of the abdomen reveal - Soft, Non Tender, No Rebound tenderness, No Rigidity (guarding) and No hepatosplenomegaly. Auscultation Auscultation of the abdomen reveals - Bowel sounds normal.  Neurologic Neurologic evaluation reveals -alert and oriented x 3 with no impairment of recent or remote memory. Mental Status-Normal.  Musculoskeletal Note: She seems to be wheelchair bound now but is able to stand with physical therapy and take a few steps.   Lymphatic Head & Neck  General Head & Neck Lymphatics: Bilateral - Description - Normal. Axillary  General Axillary Region: Bilateral - Description - Normal.  Tenderness - Non Tender. Femoral & Inguinal  Generalized Femoral & Inguinal Lymphatics: Bilateral - Description - Normal. Tenderness - Non Tender.    Assessment & Plan  PRIMARY CANCER OF RIGHT FEMALE BREAST (C50.911) Impression: The patient is 4 years status post right modified radical mastectomy for a right-sided breast cancer. Since that time she has developed metastatic disease. She has a new nodule on the upper portion of her mid back. Her oncologist would like this biopsied in order to direct future therapy. I have discussed with her in detail the risks and benefits of the operation to do this as well as some of the technical aspects and she understands and wishes to proceed. I will try to get this done this week

## 2016-05-16 NOTE — Op Note (Signed)
05/16/2016  3:25 PM  PATIENT:  Heather [name]  71 y.o. female  PRE-OPERATIVE DIAGNOSIS:  METASTATIC BREAST CANCER  POST-OPERATIVE DIAGNOSIS:  METASTATIC BREAST CANCER  PROCEDURE:  Procedure(s): EXCISION OF BACK NODULE (Left)  SURGEON:  Surgeon(s) and Role:    * Jovita Kussmaul, MD - Primary  PHYSICIAN ASSISTANT:   ASSISTANTS: none   ANESTHESIA:   local  EBL:  No intake/output data recorded.  BLOOD ADMINISTERED:none  DRAINS: none   LOCAL MEDICATIONS USED:  MARCAINE    and LIDOCAINE   SPECIMEN:  Source of Specimen:  nodule of upper back  DISPOSITION OF SPECIMEN:  PATHOLOGY  COUNTS:  YES  TOURNIQUET:  * No tourniquets in log *  DICTATION: .Dragon Dictation   After informed consent was obtained the patient was brought to the operating room and placed in the lateral position with her left side up on the operating room table. The area of the nodule on her upper back was prepped with ChloraPrep, allowed to dry, and draped in usual sterile manner. An appropriate timeout was performed. The area around the nodule was infiltrated with 1% lidocaine and quarter percent Marcaine until a good field block was created. An elliptical incision was made with a 15 blade knife overlying the mass. The incision was carried through the skin and subcutaneous tissue sharply with the electrocautery until the mass was identified. The mass was excised completely with the electrocautery. Once the mass was removed it was sent to pathology for further evaluation. Hemostasis was achieved using the Bovie electrocautery. The deep portion of the wound was then closed with layers of interrupted 3-0 Vicryl stitches. The skin was then closed with interrupted 4-0 Monocryl subcuticular stitches. Dermabond dressings were applied. The patient tolerated the procedure well. At the end of the case all needle sponge and instrument counts were correct. The patient was then taken to recovery in stable condition. The mass was  located deep in the subcutaneous tissue and measured about 2.5 x 2.5 cm.  PLAN OF CARE: Discharge to home after PACU  PATIENT DISPOSITION:  PACU - hemodynamically stable.   Delay start of Pharmacological VTE agent (>24hrs) due to surgical blood loss or risk of bleeding: not applicable

## 2016-05-17 ENCOUNTER — Encounter (HOSPITAL_BASED_OUTPATIENT_CLINIC_OR_DEPARTMENT_OTHER): Payer: Self-pay | Admitting: General Surgery

## 2016-05-30 DIAGNOSIS — T84498A Other mechanical complication of other internal orthopedic devices, implants and grafts, initial encounter: Secondary | ICD-10-CM | POA: Diagnosis not present

## 2016-05-30 DIAGNOSIS — C7951 Secondary malignant neoplasm of bone: Secondary | ICD-10-CM | POA: Diagnosis not present

## 2016-06-02 ENCOUNTER — Ambulatory Visit (HOSPITAL_BASED_OUTPATIENT_CLINIC_OR_DEPARTMENT_OTHER): Payer: Medicare Other | Admitting: Hematology and Oncology

## 2016-06-02 ENCOUNTER — Encounter: Payer: Self-pay | Admitting: Hematology and Oncology

## 2016-06-02 ENCOUNTER — Ambulatory Visit: Payer: Medicare Other

## 2016-06-02 ENCOUNTER — Other Ambulatory Visit (HOSPITAL_BASED_OUTPATIENT_CLINIC_OR_DEPARTMENT_OTHER): Payer: Medicare Other

## 2016-06-02 ENCOUNTER — Ambulatory Visit (HOSPITAL_BASED_OUTPATIENT_CLINIC_OR_DEPARTMENT_OTHER): Payer: Medicare Other

## 2016-06-02 VITALS — BP 166/81 | HR 75 | Temp 97.9°F | Resp 17 | Wt 139.0 lb

## 2016-06-02 DIAGNOSIS — C7951 Secondary malignant neoplasm of bone: Secondary | ICD-10-CM

## 2016-06-02 DIAGNOSIS — C50411 Malignant neoplasm of upper-outer quadrant of right female breast: Secondary | ICD-10-CM

## 2016-06-02 DIAGNOSIS — C792 Secondary malignant neoplasm of skin: Secondary | ICD-10-CM

## 2016-06-02 DIAGNOSIS — Z5112 Encounter for antineoplastic immunotherapy: Secondary | ICD-10-CM | POA: Diagnosis not present

## 2016-06-02 DIAGNOSIS — C50112 Malignant neoplasm of central portion of left female breast: Secondary | ICD-10-CM

## 2016-06-02 DIAGNOSIS — C50911 Malignant neoplasm of unspecified site of right female breast: Secondary | ICD-10-CM

## 2016-06-02 DIAGNOSIS — C50912 Malignant neoplasm of unspecified site of left female breast: Secondary | ICD-10-CM

## 2016-06-02 DIAGNOSIS — Z170 Estrogen receptor positive status [ER+]: Secondary | ICD-10-CM | POA: Diagnosis not present

## 2016-06-02 LAB — CBC WITH DIFFERENTIAL/PLATELET
BASO%: 0.5 % (ref 0.0–2.0)
Basophils Absolute: 0 10*3/uL (ref 0.0–0.1)
EOS%: 1.8 % (ref 0.0–7.0)
Eosinophils Absolute: 0.1 10*3/uL (ref 0.0–0.5)
HCT: 40.1 % (ref 34.8–46.6)
HGB: 13.2 g/dL (ref 11.6–15.9)
LYMPH%: 18.6 % (ref 14.0–49.7)
MCH: 28 pg (ref 25.1–34.0)
MCHC: 32.9 g/dL (ref 31.5–36.0)
MCV: 85.1 fL (ref 79.5–101.0)
MONO#: 0.4 10*3/uL (ref 0.1–0.9)
MONO%: 9.3 % (ref 0.0–14.0)
NEUT#: 3.1 10*3/uL (ref 1.5–6.5)
NEUT%: 69.8 % (ref 38.4–76.8)
Platelets: 173 10*3/uL (ref 145–400)
RBC: 4.71 10*6/uL (ref 3.70–5.45)
RDW: 13.5 % (ref 11.2–14.5)
WBC: 4.4 10*3/uL (ref 3.9–10.3)
lymph#: 0.8 10*3/uL — ABNORMAL LOW (ref 0.9–3.3)

## 2016-06-02 LAB — COMPREHENSIVE METABOLIC PANEL
ALT: 24 U/L (ref 0–55)
AST: 27 U/L (ref 5–34)
Albumin: 4.3 g/dL (ref 3.5–5.0)
Alkaline Phosphatase: 83 U/L (ref 40–150)
Anion Gap: 9 mEq/L (ref 3–11)
BUN: 16.1 mg/dL (ref 7.0–26.0)
CO2: 26 mEq/L (ref 22–29)
Calcium: 10.3 mg/dL (ref 8.4–10.4)
Chloride: 103 mEq/L (ref 98–109)
Creatinine: 0.8 mg/dL (ref 0.6–1.1)
EGFR: 78 mL/min/{1.73_m2} — ABNORMAL LOW (ref >90)
Glucose: 94 mg/dl (ref 70–140)
Potassium: 4.3 mEq/L (ref 3.5–5.1)
Sodium: 138 mEq/L (ref 136–145)
Total Bilirubin: 0.45 mg/dL (ref 0.20–1.20)
Total Protein: 7.8 g/dL (ref 6.4–8.3)

## 2016-06-02 MED ORDER — SODIUM CHLORIDE 0.9 % IJ SOLN
10.0000 mL | INTRAMUSCULAR | Status: DC | PRN
  Administered 2016-06-02: 10 mL
  Filled 2016-06-02: qty 10

## 2016-06-02 MED ORDER — DIPHENHYDRAMINE HCL 25 MG PO CAPS: 25.0000 mg | ORAL_CAPSULE | Freq: Once | ORAL | Status: DC

## 2016-06-02 MED ORDER — DIPHENHYDRAMINE HCL 25 MG PO CAPS
ORAL_CAPSULE | ORAL | Status: AC
  Filled 2016-06-02: qty 1

## 2016-06-02 MED ORDER — ACETAMINOPHEN 325 MG PO TABS
650.0000 mg | ORAL_TABLET | Freq: Once | ORAL | Status: AC
  Administered 2016-06-02: 650 mg via ORAL

## 2016-06-02 MED ORDER — SODIUM CHLORIDE 0.9 % IJ SOLN
10.0000 mL | INTRAMUSCULAR | Status: DC | PRN
  Administered 2016-06-02: 10 mL via INTRAVENOUS
  Filled 2016-06-02: qty 10

## 2016-06-02 MED ORDER — TRASTUZUMAB CHEMO 150 MG IV SOLR
6.0000 mg/kg | Freq: Once | INTRAVENOUS | Status: AC
  Administered 2016-06-02: 399 mg via INTRAVENOUS
  Filled 2016-06-02: qty 19

## 2016-06-02 MED ORDER — HEPARIN SOD (PORK) LOCK FLUSH 100 UNIT/ML IV SOLN
500.0000 [IU] | Freq: Once | INTRAVENOUS | Status: AC | PRN
  Administered 2016-06-02: 500 [IU]
  Filled 2016-06-02: qty 5

## 2016-06-02 MED ORDER — ACETAMINOPHEN 325 MG PO TABS
ORAL_TABLET | ORAL | Status: AC
  Filled 2016-06-02: qty 2

## 2016-06-02 MED ORDER — SODIUM CHLORIDE 0.9 % IV SOLN
Freq: Once | INTRAVENOUS | Status: AC
  Administered 2016-06-02: 10:00:00 via INTRAVENOUS

## 2016-06-02 NOTE — Progress Notes (Signed)
Patient Care Team: [name], MD as PCP - General (Family Medicine)  DIAGNOSIS:  Encounter Diagnoses  Name Primary?  . Carcinoma of left breast metastatic to bone ([place]) Yes  . Metastatic cancer to spine ([place])   . Primary cancer of upper outer quadrant of right female breast ([place])     SUMMARY OF ONCOLOGIC HISTORY:   Primary cancer of upper outer quadrant of right female breast ([place])   03/08/2012 Initial Diagnosis    Right breast invasive ductal carcinoma ER positive PR negative HER-2 positive Ki-67 44%; another breast mass biopsied in the anterior part of the breast which was also positive for malignancy that was HER-2 negative      05/10/2012 Surgery    Right mastectomy and axillary lymph node dissection: Multifocal disease 5 cm, 1.7 cm, 1.6 cm, ER positive PR negative HER-2 positive Ki-67 44%, 3/7 lymph nodes positive      06/14/2012 - 06/12/2013 Chemotherapy    Adjuvant chemotherapy with Anthon 6 followed by Herceptin maintenance      10/25/2012 - 12/11/2012 Radiation Therapy    Adjuvant radiation therapy      02/07/2013 -  Anti-estrogen oral therapy    Letrozole 2.5 mg daily      02/14/2015 Imaging    MRI spine: Large destructive T5 lesion with severe pathologic compression fracture and extensive epidural tumor, severe spinal stenosis with moderate cord compression, tumor involvement of T4      03/18/2015 PET scan    Residual enhancing soft tissue adjacent to the spinal cord. No evidence of metastatic disease. Nonspecific uptake the left nipple      03/27/2015 - 03/30/2015 Hospital Admission    T4-6 decompression for spinal cord compression (lower extremity paralysis)      04/10/2015 -  Chemotherapy    Palliative treatment with Herceptin every 3 weeks with letrozole 2.5 mg daily      04/13/2015 - 05/19/2015 Radiation Therapy    Palliative radiation treatment to the spine      09/01/2015 Imaging    CT chest abdomen pelvis: Pathologic fracture with posterior fusion at T5  no other evidence of metastatic disease in the chest abdomen pelvis      12/23/2015 Imaging    CT CAP: new nonspecific 0.6 cm lymph node was stated mediastinum needs follow-up CT, innumerable tiny groundglass pulmonary nodules throughout both lungs unchanged, patchy consolidation from radiation, T5 fracture, no mets      04/15/2016 PET scan    Rt para-spinal mass mildy hypermetabolic SUV 5.9, lytic cortical lesion 4.8 cm lesion left prox femur suv 8.8 favor osseus met disease      04/28/2016 - 05/12/2016 Radiation Therapy    Palliative XRT Left femur      05/24/2016 Procedure    Soft tissue mass biopsy back: Metastatic breast cancer ER 80%, PR 0%, Ki-67 50%, HER-2 positive ratio 2.25       CHIEF COMPLIANT: Patient is here to review the recent skin biopsy results  INTERVAL HISTORY: Heather [name] is a 71 year old with above-mentioned history of metastatic breast cancer who presented with femur metastases so underwent radiation therapy. She was also noted to have subcutaneous nodule in the back which was excised by Dr. Marlou Starks. She is here today to discuss the results of the biopsy. The biopsy revealed that this was ER/PR positive HER-2 positive breast cancer. She had seen Dr.Nundkumar who recommended that she undergoes repeat surgery to fix the screws in the back. She is anxious to also hear the results  of the biopsy and to discuss the future treatment plan.  REVIEW OF SYSTEMS:   Constitutional: Denies fevers, chills or abnormal weight loss Eyes: Denies blurriness of vision Ears, nose, mouth, throat, and face: Denies mucositis or sore throat Respiratory: Denies cough, dyspnea or wheezes Cardiovascular: Denies palpitation, chest discomfort Gastrointestinal:  Denies nausea, heartburn or change in bowel habits Skin: Denies abnormal skin rashes Lymphatics: Denies new lymphadenopathy or easy bruising Neurological: Neurological weakness has improved significantly Behavioral/Psych: Mood is stable,  no new changes  Extremities: No lower extremity edema Breast:  denies any pain or lumps or nodules in either breasts All other systems were reviewed with the patient and are negative.  I have reviewed the past medical history, past surgical history, social history and family history with the patient and they are unchanged from previous note.  ALLERGIES:  is allergic to codeine; keflex [cephalexin]; naprosyn [naproxen]; oxycodone; and tussin [guaifenesin].  MEDICATIONS:  Current Outpatient Prescriptions  Medication Sig Dispense Refill  . b complex vitamins tablet Take 1 tablet by mouth daily.    . Calcium Carbonate Antacid (TUMS PO) Take 2 tablets by mouth 2 (two) times daily as needed (acid reflux).    . Cholecalciferol (VITAMIN D) 2000 UNITS tablet Take 2,000 Units by mouth daily.    . Coenzyme Q10 (COQ10) 100 MG CAPS Take 100 mg by mouth at bedtime.    . gabapentin (NEURONTIN) 600 MG tablet Take 1 tablet (600 mg total) by mouth as directed. Take 1 tablet in the AM and afternoon and 1 1/2 tabs at bedtime 315 tablet 2  . letrozole (FEMARA) 2.5 MG tablet Take 1 tablet (2.5 mg total) by mouth daily. 90 tablet 3  . lidocaine-prilocaine (EMLA) cream Apply to affected area once (Patient taking differently: Apply 1 application topically every 21 ( twenty-one) days. Apply to port prior to infusions) 30 g 3  . OVER THE COUNTER MEDICATION Place 1 drop into both eyes 2 (two) times daily as needed (dry eyes). Over the counter lubricating eye drops    . polyethylene glycol (MIRALAX / GLYCOLAX) packet Take 17 g by mouth 2 (two) times daily. 100 each 0  . pravastatin (PRAVACHOL) 10 MG tablet Take 10 mg by mouth daily.    . Probiotic Product (PROBIOTIC DAILY PO) Take 1 capsule by mouth daily.     . prochlorperazine (COMPAZINE) 10 MG tablet Take 1 tablet (10 mg total) by mouth every 6 (six) hours as needed for nausea or vomiting. (Patient not taking: Reported on 05/16/2016) 30 tablet 0  . ranitidine (ZANTAC)  300 MG tablet Take 300 mg by mouth at bedtime as needed. Reported on 06/25/2015    . spironolactone (ALDACTONE) 25 MG tablet Take 0.5 tablets (12.5 mg total) by mouth daily. 45 tablet 12  . traMADol (ULTRAM) 50 MG tablet Take 1 tablet (50 mg total) by mouth every 12 (twelve) hours as needed for moderate pain. (Patient not taking: Reported on 05/16/2016) 30 tablet 0  . traMADol (ULTRAM) 50 MG tablet Take 1-2 tablets (50-100 mg total) by mouth every 6 (six) hours as needed. 30 tablet 1   No current facility-administered medications for this visit.    Facility-Administered Medications Ordered in Other Visits  Medication Dose Route Frequency Provider Last Rate Last Dose  . diphenhydrAMINE (BENADRYL) capsule 25 mg  25 mg Oral Once [name], MD      . sodium chloride 0.9 % injection 10 mL  10 mL Intracatheter PRN [name], MD   10 mL  at 01/07/16 1620  . sodium chloride 0.9 % injection 10 mL  10 mL Intracatheter PRN [name], MD   10 mL at 06/02/16 1147    PHYSICAL EXAMINATION: ECOG PERFORMANCE STATUS: 1 - Symptomatic but completely ambulatory  Vitals:   06/02/16 0915  BP: (!) 166/81  Pulse: 75  Resp: 17  Temp: 97.9 F (36.6 C)   Filed Weights   06/02/16 0915  Weight: 139 lb (63 kg)    GENERAL:alert, no distress and comfortable SKIN: skin color, texture, turgor are normal, no rashes or significant lesions EYES: normal, Conjunctiva are pink and non-injected, sclera clear OROPHARYNX:no exudate, no erythema and lips, buccal mucosa, and tongue normal  NECK: supple, thyroid normal size, non-tender, without nodularity LYMPH:  no palpable lymphadenopathy in the cervical, axillary or inguinal LUNGS: clear to auscultation and percussion with normal breathing effort HEART: regular rate & rhythm and no murmurs and no lower extremity edema ABDOMEN:abdomen soft, non-tender and normal bowel sounds MUSCULOSKELETAL:no cyanosis of digits and no clubbing  NEURO: alert & oriented x 3 with  fluent speech, no focal motor/sensory deficits EXTREMITIES: No lower extremity edema  LABORATORY DATA:  I have reviewed the data as listed   Chemistry      Component Value Date/Time   NA 138 06/02/2016 0853   K 4.3 06/02/2016 0853   CL 99 (L) 03/28/2015 0446   CL 101 10/25/2012 0946   CO2 26 06/02/2016 0853   BUN 16.1 06/02/2016 0853   CREATININE 0.8 06/02/2016 0853      Component Value Date/Time   CALCIUM 10.3 06/02/2016 0853   ALKPHOS 83 06/02/2016 0853   AST 27 06/02/2016 0853   ALT 24 06/02/2016 0853   BILITOT 0.45 06/02/2016 0853       Lab Results  Component Value Date   WBC 4.4 06/02/2016   HGB 13.2 06/02/2016   HCT 40.1 06/02/2016   MCV 85.1 06/02/2016   PLT 173 06/02/2016   NEUTROABS 3.1 06/02/2016    ASSESSMENT & PLAN:  Primary cancer of upper outer quadrant of right female breast ([place]) Right breast invasive ductal carcinoma ER positive PR negative HER-2 positive Ki-67 44% multifocal disease 3/7 lymph nodes positive T2 N1 M0 stage IIB status post adjuvant chemotherapy with TCH followed by Herceptin maintenance, adjuvant radiation therapy and Letrozole. MRI 02/14/2015: T5 large destructive lesion with pathologic compression fracture and extensive epidural tumor, involvement of T4, excision metastatic carcinoma ER 60%, PR 0%, HER-2 positive ratio 2.71 average copy #7.45 03/27/2015: T4-6 decompression surgery for spinal cord compression PET-CT 04/17/16 Rt para-spinal mass mildy hypermetabolic SUV 5.9, lytic cortical lesion 4.8 cm lesion left prox femur suv 8.8 favor osseus met disease   Treatment summary: 1. Resumed anastrozole 05/21/2015, but it has caused significant hair loss so we switched her to exemestane 25 mg once daily 07/23/2015 2. Herceptin every 3 week started 04/10/2015 3. Bone metastases: Zometa every 3 months. 4. Radiation therapy to the spine (Dr. [name]) 04/13/2015 to 05/19/2015 5. Radiation therapy to left femur 04/28/2016 to 05/12/2016    -------------------------------------------------------------------------------------------------------------------------- Goal of treatment: Palliation Zometa toxicities: bone pain and fatigue. If it causes persistent symptoms, Zometa every 3 months.  Biopsy of the subcutaneous nodule 05/16/16:Metastatic breast cancer ER 80%, PR 0%, HER-2 positive ratio 2.25, Ki-67 50%  Treatment plan: 1. I discussed the case with Dr.Nundkumar and the patient needs surgery to fix the screws on her back. I recommended that she do that and had of any change in treatment. 2. start new treatment  with Faslodex along with Herceptin and Perjeta We discussed other options including Ibrance and Kadcyla  Start Faslodex in [redacted] week along with Herceptin and Perjeta. I counseled her regarding this and benefits of this treatment and she is agreeable to proceed.   I spent 35 minutes talking to the patient of which more than half was spent in counseling and coordination of care.  No orders of the defined types were placed in this encounter.  The patient has a good understanding of the overall plan. she agrees with it. she will call with any problems that may develop before the next visit here.   Rulon Eisenmenger, MD 06/02/16

## 2016-06-02 NOTE — Progress Notes (Signed)
START OFF PATHWAY REGIMEN - Breast  Off Pathway: Medical: [Trastuzumab, Pertuzumab]  Custom Intervention:Medical: [Trastuzumab, Pertuzumab]:     Trastuzumab (Herceptin(R))   Dose Mod: None     Pertuzumab (Perjeta(R))   Dose Mod: None  **Always confirm dose/schedule in your pharmacy ordering system**    Patient Characteristics: Metastatic Chemotherapy, HER2/neu Positive, ER +, Third Line AJCC Stage Grouping: IV Current Disease Status: Distant Metastases AJCC M Stage: X ER Status: Positive (+) AJCC N Stage: X AJCC T Stage: X HER2/neu: Positive (+) PR Status: Negative (-) Line of therapy: Third Line Would you be surprised if this patient died  in the next year? I would be surprised if this patient died in the next year  Intent of Therapy: Non-Curative / Palliative Intent, Discussed with Patient

## 2016-06-02 NOTE — Patient Instructions (Signed)
[facility] Discharge Instructions for Patients Receiving Chemotherapy  Today you received the following chemotherapy agents Herceptin.  To help prevent nausea and vomiting after your treatment, we encourage you to take your nausea medication as directed.   If you develop nausea and vomiting that is not controlled by your nausea medication, call the clinic.   BELOW ARE SYMPTOMS THAT SHOULD BE REPORTED IMMEDIATELY:  *FEVER GREATER THAN 100.5 F  *CHILLS WITH OR WITHOUT FEVER  NAUSEA AND VOMITING THAT IS NOT CONTROLLED WITH YOUR NAUSEA MEDICATION  *UNUSUAL SHORTNESS OF BREATH  *UNUSUAL BRUISING OR BLEEDING  TENDERNESS IN MOUTH AND THROAT WITH OR WITHOUT PRESENCE OF ULCERS  *URINARY PROBLEMS  *BOWEL PROBLEMS  UNUSUAL RASH Items with * indicate a potential emergency and should be followed up as soon as possible.  Feel free to call the clinic you have any questions or concerns. The clinic phone number is [phone].  

## 2016-06-02 NOTE — Assessment & Plan Note (Signed)
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 MRI 02/14/2015: T5 large destructive lesion with pathologic compression fracture and extensive epidural tumor, involvement of T4, excision metastatic carcinoma ER 60%, PR 0%, HER-2 positive ratio 2.71 average copy #7.45 03/27/2015: T4-6 decompression surgery for spinal cord compression PET-CT 04/17/16 Rt para-spinal mass mildy hypermetabolic SUV 5.9, lytic cortical lesion 4.8 cm lesion left prox femur suv 8.8 favor osseus met disease   Treatment summary: 1. Resumed anastrozole 05/21/2015, but it has caused significant hair loss so we switched her to exemestane 25 mg once daily 07/23/2015 2. Herceptin every 3 week started 04/10/2015 3. Bone metastases: Zometa every 3 months. 4. Radiation therapy to the spine (Dr. [name]) 04/13/2015 to 05/19/2015 5. Radiation therapy to left femur 04/28/2016 to 05/12/2016  -------------------------------------------------------------------------------------------------------------------------- Goal of treatment: Palliation Zometa toxicities: bone pain and fatigue. If it causes persistent symptoms, Zometa every 3 months.  Biopsy of the subcutaneous nodule 05/16/16:Metastatic breast cancer ER 80%, PR 0%, HER-2 positive ratio 2.25, Ki-67 50%  Treatment plan: 1. I discussed the case with Dr.Nundkumar and the patient needs surgery to fix the screws on her back. I recommended that she do that and had of any change in treatment. 2. start new treatment with Faslodex along with Herceptin and Perjeta We discussed other options including Ibrance and Kadcyla

## 2016-06-02 NOTE — Progress Notes (Signed)
Patient on plan of care prior to pathways. 

## 2016-06-03 ENCOUNTER — Telehealth: Payer: Self-pay | Admitting: Hematology and Oncology

## 2016-06-03 ENCOUNTER — Other Ambulatory Visit: Payer: Self-pay | Admitting: Neurosurgery

## 2016-06-03 ENCOUNTER — Telehealth: Payer: Self-pay | Admitting: Genetic Counselor

## 2016-06-03 NOTE — Telephone Encounter (Signed)
Appointment time changed per patient request per having a prior scheduled appointment in the am.

## 2016-06-03 NOTE — Telephone Encounter (Signed)
Pt confirmed appt and requested no letter for appointment

## 2016-06-06 DIAGNOSIS — Z08 Encounter for follow-up examination after completed treatment for malignant neoplasm: Secondary | ICD-10-CM | POA: Diagnosis not present

## 2016-06-06 DIAGNOSIS — M40204 Unspecified kyphosis, thoracic region: Secondary | ICD-10-CM | POA: Diagnosis not present

## 2016-06-06 DIAGNOSIS — Z9011 Acquired absence of right breast and nipple: Secondary | ICD-10-CM | POA: Diagnosis not present

## 2016-06-06 DIAGNOSIS — Z9889 Other specified postprocedural states: Secondary | ICD-10-CM | POA: Diagnosis not present

## 2016-06-06 DIAGNOSIS — D492 Neoplasm of unspecified behavior of bone, soft tissue, and skin: Secondary | ICD-10-CM | POA: Diagnosis not present

## 2016-06-06 DIAGNOSIS — Z9689 Presence of other specified functional implants: Secondary | ICD-10-CM | POA: Diagnosis not present

## 2016-06-08 ENCOUNTER — Telehealth: Payer: Self-pay | Admitting: *Deleted

## 2016-06-08 NOTE — Telephone Encounter (Signed)
"  I'm scheduled for corrective spine surgery Monday 06-13-2016.  I know it's my choice but I would appreciate Dr. Geralyn Flash feelings on my situation.  I have osteoporosis,  I am to begin a new chemotherapy treatment with Perjetta 06-23-2016.  The neurosurgeon wants to put rods in my back, tighten screws, correcting protrusion quickly because I was to take Ibrance.  I received a second opinion which is a longer surgery to straighten my back and do the corrective surgery which involves another MRI.  Can I postpone surgery since I am not going on the Ibrance?  I do not know how long it will take to heal.  I do not know if my spine will keep bending.  Do I have time to postpone surgery while I decide which surgery to proceed with?  Does the surgery and healing time affect my chemotherapy treatment and schedule.  I was unable to walk last year and do not want to go back.  Currently my balance is off but I get around good.  The two choices is a lot to think about.  Please ask [name] to call me at [phone]."    Returned patient call after voice mail retrieved requesting collaborative return call for provider questions.

## 2016-06-09 ENCOUNTER — Ambulatory Visit: Payer: Medicare Other

## 2016-06-09 ENCOUNTER — Ambulatory Visit (HOSPITAL_BASED_OUTPATIENT_CLINIC_OR_DEPARTMENT_OTHER): Payer: Medicare Other

## 2016-06-09 VITALS — BP 150/77 | HR 82 | Temp 98.2°F

## 2016-06-09 DIAGNOSIS — I10 Essential (primary) hypertension: Secondary | ICD-10-CM | POA: Diagnosis not present

## 2016-06-09 DIAGNOSIS — Z5111 Encounter for antineoplastic chemotherapy: Secondary | ICD-10-CM

## 2016-06-09 DIAGNOSIS — C50411 Malignant neoplasm of upper-outer quadrant of right female breast: Secondary | ICD-10-CM | POA: Diagnosis not present

## 2016-06-09 DIAGNOSIS — E785 Hyperlipidemia, unspecified: Secondary | ICD-10-CM | POA: Diagnosis not present

## 2016-06-09 DIAGNOSIS — C7951 Secondary malignant neoplasm of bone: Secondary | ICD-10-CM | POA: Diagnosis not present

## 2016-06-09 DIAGNOSIS — C493 Malignant neoplasm of connective and soft tissue of thorax: Secondary | ICD-10-CM | POA: Diagnosis not present

## 2016-06-09 MED ORDER — FULVESTRANT 250 MG/5ML IM SOLN
500.0000 mg | Freq: Once | INTRAMUSCULAR | Status: AC
  Administered 2016-06-09: 500 mg via INTRAMUSCULAR
  Filled 2016-06-09: qty 10

## 2016-06-09 NOTE — Patient Instructions (Signed)

## 2016-06-10 ENCOUNTER — Encounter (HOSPITAL_COMMUNITY): Payer: Self-pay | Admitting: *Deleted

## 2016-06-13 ENCOUNTER — Inpatient Hospital Stay (HOSPITAL_COMMUNITY): Payer: Medicare Other

## 2016-06-13 ENCOUNTER — Encounter (HOSPITAL_COMMUNITY): Payer: Self-pay | Admitting: *Deleted

## 2016-06-13 ENCOUNTER — Inpatient Hospital Stay (HOSPITAL_COMMUNITY)
Admission: RE | Admit: 2016-06-13 | Discharge: 2016-06-15 | DRG: 458 | Disposition: A | Discharge status: Home / Self Care | Payer: Medicare Other | Source: Ambulatory Visit | Attending: Neurosurgery | Admitting: Neurosurgery

## 2016-06-13 ENCOUNTER — Inpatient Hospital Stay (HOSPITAL_COMMUNITY): Payer: Medicare Other | Admitting: Anesthesiology

## 2016-06-13 ENCOUNTER — Encounter (HOSPITAL_COMMUNITY)
Admission: RE | Disposition: A | Discharge status: Home / Self Care | Payer: Self-pay | Source: Ambulatory Visit | Attending: Neurosurgery

## 2016-06-13 DIAGNOSIS — Z85828 Personal history of other malignant neoplasm of skin: Secondary | ICD-10-CM | POA: Diagnosis not present

## 2016-06-13 DIAGNOSIS — Y838 Other surgical procedures as the cause of abnormal reaction of the patient, or of later complication, without mention of misadventure at the time of the procedure: Secondary | ICD-10-CM | POA: Diagnosis present

## 2016-06-13 DIAGNOSIS — Z853 Personal history of malignant neoplasm of breast: Secondary | ICD-10-CM

## 2016-06-13 DIAGNOSIS — M40204 Unspecified kyphosis, thoracic region: Secondary | ICD-10-CM | POA: Diagnosis present

## 2016-06-13 DIAGNOSIS — K219 Gastro-esophageal reflux disease without esophagitis: Secondary | ICD-10-CM | POA: Diagnosis present

## 2016-06-13 DIAGNOSIS — Z01818 Encounter for other preprocedural examination: Secondary | ICD-10-CM

## 2016-06-13 DIAGNOSIS — D649 Anemia, unspecified: Secondary | ICD-10-CM | POA: Diagnosis present

## 2016-06-13 DIAGNOSIS — Z85830 Personal history of malignant neoplasm of bone: Secondary | ICD-10-CM | POA: Diagnosis not present

## 2016-06-13 DIAGNOSIS — I10 Essential (primary) hypertension: Secondary | ICD-10-CM | POA: Diagnosis present

## 2016-06-13 DIAGNOSIS — D696 Thrombocytopenia, unspecified: Secondary | ICD-10-CM | POA: Diagnosis not present

## 2016-06-13 DIAGNOSIS — E785 Hyperlipidemia, unspecified: Secondary | ICD-10-CM | POA: Diagnosis not present

## 2016-06-13 DIAGNOSIS — T84296A Other mechanical complication of internal fixation device of vertebrae, initial encounter: Secondary | ICD-10-CM | POA: Diagnosis not present

## 2016-06-13 DIAGNOSIS — C7951 Secondary malignant neoplasm of bone: Secondary | ICD-10-CM | POA: Diagnosis not present

## 2016-06-13 DIAGNOSIS — G629 Polyneuropathy, unspecified: Secondary | ICD-10-CM | POA: Diagnosis not present

## 2016-06-13 DIAGNOSIS — F419 Anxiety disorder, unspecified: Secondary | ICD-10-CM | POA: Diagnosis not present

## 2016-06-13 DIAGNOSIS — M960 Pseudarthrosis after fusion or arthrodesis: Principal | ICD-10-CM | POA: Diagnosis present

## 2016-06-13 DIAGNOSIS — Z419 Encounter for procedure for purposes other than remedying health state, unspecified: Secondary | ICD-10-CM

## 2016-06-13 DIAGNOSIS — Z981 Arthrodesis status: Secondary | ICD-10-CM | POA: Diagnosis not present

## 2016-06-13 DIAGNOSIS — Z923 Personal history of irradiation: Secondary | ICD-10-CM

## 2016-06-13 DIAGNOSIS — T84498A Other mechanical complication of other internal orthopedic devices, implants and grafts, initial encounter: Secondary | ICD-10-CM

## 2016-06-13 DIAGNOSIS — Z79899 Other long term (current) drug therapy: Secondary | ICD-10-CM

## 2016-06-13 DIAGNOSIS — G4700 Insomnia, unspecified: Secondary | ICD-10-CM | POA: Diagnosis present

## 2016-06-13 DIAGNOSIS — M546 Pain in thoracic spine: Secondary | ICD-10-CM | POA: Diagnosis not present

## 2016-06-13 HISTORY — DX: Polyneuropathy, unspecified: G62.9

## 2016-06-13 LAB — BASIC METABOLIC PANEL
Anion gap: 11 (ref 5–15)
BUN: 16 mg/dL (ref 6–20)
CO2: 27 mmol/L (ref 22–32)
Calcium: 9.7 mg/dL (ref 8.9–10.3)
Chloride: 103 mmol/L (ref 101–111)
Creatinine, Ser: 0.89 mg/dL (ref 0.44–1.00)
GFR calc Af Amer: >60 mL/min (ref >60)
GFR calc non Af Amer: >60 mL/min (ref >60)
Glucose, Bld: 92 mg/dL (ref 65–99)
Potassium: 3.9 mmol/L (ref 3.5–5.1)
Sodium: 141 mmol/L (ref 135–145)

## 2016-06-13 LAB — CBC
HCT: 40.6 % (ref 36.0–46.0)
Hemoglobin: 13.2 g/dL (ref 12.0–15.0)
MCH: 27.8 pg (ref 26.0–34.0)
MCHC: 32.5 g/dL (ref 30.0–36.0)
MCV: 85.7 fL (ref 78.0–100.0)
Platelets: 178 10*3/uL (ref 150–400)
RBC: 4.74 MIL/uL (ref 3.87–5.11)
RDW: 13.5 % (ref 11.5–15.5)
WBC: 4.5 10*3/uL (ref 4.0–10.5)

## 2016-06-13 LAB — POCT I-STAT 4, (NA,K, GLUC, HGB,HCT)
Glucose, Bld: 128 mg/dL — ABNORMAL HIGH (ref 65–99)
HCT: 28 % — ABNORMAL LOW (ref 36.0–46.0)
Hemoglobin: 9.5 g/dL — ABNORMAL LOW (ref 12.0–15.0)
Potassium: 4.4 mmol/L (ref 3.5–5.1)
Sodium: 138 mmol/L (ref 135–145)

## 2016-06-13 LAB — PREPARE RBC (CROSSMATCH)

## 2016-06-13 LAB — SURGICAL PCR SCREEN
MRSA, PCR: NEGATIVE
Staphylococcus aureus: NEGATIVE

## 2016-06-13 SURGERY — POSTERIOR LUMBAR FUSION 2 WITH HARDWARE REMOVAL
Anesthesia: General | Site: Back

## 2016-06-13 MED ORDER — BUPIVACAINE HCL (PF) 0.5 % IJ SOLN
INTRAMUSCULAR | Status: AC
  Filled 2016-06-13: qty 30

## 2016-06-13 MED ORDER — PROPOFOL 10 MG/ML IV BOLUS
INTRAVENOUS | Status: AC
  Filled 2016-06-13: qty 20

## 2016-06-13 MED ORDER — ALBUMIN HUMAN 5 % IV SOLN
INTRAVENOUS | Status: DC | PRN
  Administered 2016-06-13 (×2): via INTRAVENOUS

## 2016-06-13 MED ORDER — HYDROMORPHONE HCL 1 MG/ML IJ SOLN
INTRAMUSCULAR | Status: AC
  Administered 2016-06-13: 0.5 mg via INTRAVENOUS
  Filled 2016-06-13: qty 0.5

## 2016-06-13 MED ORDER — LIDOCAINE-EPINEPHRINE 1 %-1:100000 IJ SOLN
INTRAMUSCULAR | Status: DC | PRN
  Administered 2016-06-13: 5 mL

## 2016-06-13 MED ORDER — HEPARIN SODIUM (PORCINE) 5000 UNIT/ML IJ SOLN: 5000.0000 [IU] | Freq: Three times a day (TID) | INTRAMUSCULAR | Status: DC

## 2016-06-13 MED ORDER — FENTANYL CITRATE (PF) 100 MCG/2ML IJ SOLN
INTRAMUSCULAR | Status: AC
  Filled 2016-06-13: qty 2

## 2016-06-13 MED ORDER — COQ10 100 MG PO CAPS: 300.0000 mg | ORAL_CAPSULE | Freq: Every day | ORAL | Status: DC

## 2016-06-13 MED ORDER — VANCOMYCIN HCL IN DEXTROSE 1-5 GM/200ML-% IV SOLN
1000.0000 mg | INTRAVENOUS | Status: AC
  Administered 2016-06-13: 1000 mg via INTRAVENOUS
  Filled 2016-06-13: qty 200

## 2016-06-13 MED ORDER — METHOCARBAMOL 500 MG PO TABS: 500.0000 mg | ORAL_TABLET | Freq: Four times a day (QID) | ORAL | Status: DC | PRN

## 2016-06-13 MED ORDER — BACITRACIN ZINC 500 UNIT/GM EX OINT
TOPICAL_OINTMENT | CUTANEOUS | Status: AC
  Filled 2016-06-13: qty 28.35

## 2016-06-13 MED ORDER — ONDANSETRON HCL 4 MG/2ML IJ SOLN
INTRAMUSCULAR | Status: DC | PRN
  Administered 2016-06-13 (×2): 4 mg via INTRAVENOUS

## 2016-06-13 MED ORDER — METHOCARBAMOL 1000 MG/10ML IJ SOLN
500.0000 mg | Freq: Four times a day (QID) | INTRAVENOUS | Status: DC | PRN
  Filled 2016-06-13: qty 5

## 2016-06-13 MED ORDER — CHLORHEXIDINE GLUCONATE CLOTH 2 % EX PADS: 6.0000 | MEDICATED_PAD | Freq: Once | CUTANEOUS | Status: DC

## 2016-06-13 MED ORDER — PHENYLEPHRINE HCL 10 MG/ML IJ SOLN
INTRAVENOUS | Status: DC | PRN
  Administered 2016-06-13: 20 ug/min via INTRAVENOUS

## 2016-06-13 MED ORDER — PANTOPRAZOLE SODIUM 40 MG IV SOLR
40.0000 mg | Freq: Every day | INTRAVENOUS | Status: DC
  Administered 2016-06-13: 40 mg via INTRAVENOUS
  Filled 2016-06-13: qty 40

## 2016-06-13 MED ORDER — DEXAMETHASONE SODIUM PHOSPHATE 10 MG/ML IJ SOLN
INTRAMUSCULAR | Status: AC
  Filled 2016-06-13: qty 1

## 2016-06-13 MED ORDER — SUGAMMADEX SODIUM 200 MG/2ML IV SOLN
INTRAVENOUS | Status: AC
  Filled 2016-06-13: qty 2

## 2016-06-13 MED ORDER — PHENOL 1.4 % MT LIQD
1.0000 | OROMUCOSAL | Status: DC | PRN
Start: 2016-06-13 — End: 2016-06-15

## 2016-06-13 MED ORDER — BUPIVACAINE HCL (PF) 0.5 % IJ SOLN
INTRAMUSCULAR | Status: DC | PRN
  Administered 2016-06-13: 5 mL

## 2016-06-13 MED ORDER — FENTANYL CITRATE (PF) 100 MCG/2ML IJ SOLN
INTRAMUSCULAR | Status: DC | PRN
  Administered 2016-06-13: 100 ug via INTRAVENOUS
  Administered 2016-06-13 (×2): 50 ug via INTRAVENOUS
  Administered 2016-06-13: 100 ug via INTRAVENOUS
  Administered 2016-06-13 (×2): 50 ug via INTRAVENOUS

## 2016-06-13 MED ORDER — MENTHOL 3 MG MT LOZG
1.0000 | LOZENGE | OROMUCOSAL | Status: DC | PRN
Start: 2016-06-13 — End: 2016-06-15

## 2016-06-13 MED ORDER — GABAPENTIN 300 MG PO CAPS
900.0000 mg | ORAL_CAPSULE | Freq: Every day | ORAL | Status: DC
  Administered 2016-06-13: 600 mg via ORAL
  Administered 2016-06-14: 900 mg via ORAL
  Filled 2016-06-13 (×2): qty 3

## 2016-06-13 MED ORDER — POLYETHYLENE GLYCOL 3350 17 G PO PACK: 17.0000 g | PACK | Freq: Every day | ORAL | Status: DC | PRN

## 2016-06-13 MED ORDER — SUGAMMADEX SODIUM 200 MG/2ML IV SOLN
INTRAVENOUS | Status: DC | PRN
  Administered 2016-06-13: 125 mg via INTRAVENOUS

## 2016-06-13 MED ORDER — EPHEDRINE 5 MG/ML INJ
INTRAVENOUS | Status: AC
  Filled 2016-06-13: qty 10

## 2016-06-13 MED ORDER — PROCHLORPERAZINE MALEATE 10 MG PO TABS
10.0000 mg | ORAL_TABLET | Freq: Four times a day (QID) | ORAL | Status: DC | PRN
  Filled 2016-06-13 (×2): qty 1

## 2016-06-13 MED ORDER — THROMBIN 20000 UNITS EX SOLR
CUTANEOUS | Status: DC | PRN
  Administered 2016-06-13: 20 mL via TOPICAL

## 2016-06-13 MED ORDER — PRAVASTATIN SODIUM 10 MG PO TABS
10.0000 mg | ORAL_TABLET | ORAL | Status: DC
  Filled 2016-06-13: qty 1

## 2016-06-13 MED ORDER — RISAQUAD PO CAPS
1.0000 | ORAL_CAPSULE | Freq: Every day | ORAL | Status: DC
  Filled 2016-06-13 (×2): qty 1

## 2016-06-13 MED ORDER — LACTATED RINGERS IV SOLN
INTRAVENOUS | Status: DC | PRN
  Administered 2016-06-13 (×3): via INTRAVENOUS

## 2016-06-13 MED ORDER — THROMBIN 5000 UNITS EX SOLR
CUTANEOUS | Status: AC
  Filled 2016-06-13: qty 5000

## 2016-06-13 MED ORDER — PHENYLEPHRINE 40 MCG/ML (10ML) SYRINGE FOR IV PUSH (FOR BLOOD PRESSURE SUPPORT)
PREFILLED_SYRINGE | INTRAVENOUS | Status: AC
  Filled 2016-06-13: qty 10

## 2016-06-13 MED ORDER — VITAMIN D 1000 UNITS PO TABS
2000.0000 [IU] | ORAL_TABLET | Freq: Every day | ORAL | Status: DC
  Filled 2016-06-13: qty 2

## 2016-06-13 MED ORDER — MIDAZOLAM HCL 2 MG/2ML IJ SOLN
INTRAMUSCULAR | Status: AC
  Filled 2016-06-13: qty 2

## 2016-06-13 MED ORDER — ACETAMINOPHEN 325 MG PO TABS: 650.0000 mg | ORAL_TABLET | ORAL | Status: DC | PRN

## 2016-06-13 MED ORDER — SENNA 8.6 MG PO TABS
1.0000 | ORAL_TABLET | Freq: Two times a day (BID) | ORAL | Status: DC
  Administered 2016-06-14 – 2016-06-15 (×2): 8.6 mg via ORAL
  Filled 2016-06-13 (×2): qty 1

## 2016-06-13 MED ORDER — SODIUM CHLORIDE 0.9 % IV SOLN
INTRAVENOUS | Status: DC
  Administered 2016-06-14: via INTRAVENOUS

## 2016-06-13 MED ORDER — CALCIUM CARBONATE ANTACID 500 MG PO CHEW
1.0000 | CHEWABLE_TABLET | Freq: Two times a day (BID) | ORAL | Status: DC | PRN
  Administered 2016-06-15: 200 mg via ORAL
  Filled 2016-06-13: qty 1

## 2016-06-13 MED ORDER — 0.9 % SODIUM CHLORIDE (POUR BTL) OPTIME
TOPICAL | Status: DC | PRN
  Administered 2016-06-13: 1000 mL

## 2016-06-13 MED ORDER — LIDOCAINE HCL (CARDIAC) 20 MG/ML IV SOLN
INTRAVENOUS | Status: DC | PRN
  Administered 2016-06-13: 40 mg via INTRAVENOUS

## 2016-06-13 MED ORDER — SODIUM CHLORIDE 0.9 % IV SOLN: 10.0000 mL/h | Freq: Once | INTRAVENOUS | Status: DC

## 2016-06-13 MED ORDER — GABAPENTIN 300 MG PO CAPS
600.0000 mg | ORAL_CAPSULE | Freq: Every day | ORAL | Status: DC
  Administered 2016-06-14 – 2016-06-15 (×2): 600 mg via ORAL
  Filled 2016-06-13 (×2): qty 2

## 2016-06-13 MED ORDER — DOCUSATE SODIUM 100 MG PO CAPS
100.0000 mg | ORAL_CAPSULE | Freq: Two times a day (BID) | ORAL | Status: DC
  Administered 2016-06-15: 100 mg via ORAL
  Filled 2016-06-13 (×2): qty 1

## 2016-06-13 MED ORDER — HYDROMORPHONE HCL 1 MG/ML IJ SOLN
0.2500 mg | INTRAMUSCULAR | Status: DC | PRN
Start: 2016-06-13 — End: 2016-06-13
  Administered 2016-06-13 (×3): 0.5 mg via INTRAVENOUS

## 2016-06-13 MED ORDER — ONDANSETRON HCL 4 MG/2ML IJ SOLN
INTRAMUSCULAR | Status: AC
  Filled 2016-06-13: qty 2

## 2016-06-13 MED ORDER — ARTIFICIAL TEARS OP OINT
TOPICAL_OINTMENT | OPHTHALMIC | Status: DC | PRN
  Administered 2016-06-13: 1 via OPHTHALMIC

## 2016-06-13 MED ORDER — FAMOTIDINE 20 MG PO TABS
40.0000 mg | ORAL_TABLET | Freq: Every day | ORAL | Status: DC
  Administered 2016-06-14 (×2): 40 mg via ORAL
  Filled 2016-06-13 (×2): qty 2

## 2016-06-13 MED ORDER — DIPHENHYDRAMINE HCL 50 MG/ML IJ SOLN
INTRAMUSCULAR | Status: AC
  Administered 2016-06-13: 12.5 mg via INTRAVENOUS
  Filled 2016-06-13: qty 1

## 2016-06-13 MED ORDER — SODIUM CHLORIDE 0.9 % IV SOLN: 250.0000 mL | INTRAVENOUS | Status: DC

## 2016-06-13 MED ORDER — HYDROMORPHONE HCL 1 MG/ML IJ SOLN
INTRAMUSCULAR | Status: AC
  Filled 2016-06-13: qty 0.5

## 2016-06-13 MED ORDER — DIPHENHYDRAMINE HCL 50 MG/ML IJ SOLN
12.5000 mg | Freq: Once | INTRAMUSCULAR | Status: AC
  Administered 2016-06-13: 12.5 mg via INTRAVENOUS
  Filled 2016-06-13: qty 0.25

## 2016-06-13 MED ORDER — B COMPLEX-C PO TABS
1.0000 | ORAL_TABLET | Freq: Every day | ORAL | Status: DC
  Filled 2016-06-13 (×2): qty 1

## 2016-06-13 MED ORDER — SODIUM CHLORIDE 0.9% FLUSH
3.0000 mL | Freq: Two times a day (BID) | INTRAVENOUS | Status: DC
  Administered 2016-06-14: 10 mL via INTRAVENOUS
  Administered 2016-06-14 (×2): 3 mL via INTRAVENOUS

## 2016-06-13 MED ORDER — SPIRONOLACTONE 25 MG PO TABS
12.5000 mg | ORAL_TABLET | Freq: Every day | ORAL | Status: DC
  Filled 2016-06-13 (×2): qty 1

## 2016-06-13 MED ORDER — MUPIROCIN 2 % EX OINT
1.0000 "application " | TOPICAL_OINTMENT | Freq: Once | CUTANEOUS | Status: AC
  Administered 2016-06-13: 1 via TOPICAL
  Filled 2016-06-13: qty 22

## 2016-06-13 MED ORDER — ROCURONIUM BROMIDE 100 MG/10ML IV SOLN
INTRAVENOUS | Status: DC | PRN
  Administered 2016-06-13: 25 mg via INTRAVENOUS
  Administered 2016-06-13: 20 mg via INTRAVENOUS
  Administered 2016-06-13: 50 mg via INTRAVENOUS

## 2016-06-13 MED ORDER — HYDROMORPHONE HCL 1 MG/ML IJ SOLN: 0.5000 mg | INTRAMUSCULAR | Status: DC | PRN

## 2016-06-13 MED ORDER — THROMBIN 5000 UNITS EX SOLR
OROMUCOSAL | Status: DC | PRN
  Administered 2016-06-13: 5 mL via TOPICAL

## 2016-06-13 MED ORDER — ONDANSETRON HCL 4 MG/2ML IJ SOLN
4.0000 mg | INTRAMUSCULAR | Status: DC | PRN
  Filled 2016-06-13: qty 2

## 2016-06-13 MED ORDER — SODIUM CHLORIDE 0.9 % IR SOLN
Status: DC | PRN
  Administered 2016-06-13: 500 mL

## 2016-06-13 MED ORDER — FENTANYL CITRATE (PF) 100 MCG/2ML IJ SOLN
INTRAMUSCULAR | Status: AC
  Filled 2016-06-13: qty 4

## 2016-06-13 MED ORDER — MIDAZOLAM HCL 5 MG/5ML IJ SOLN
INTRAMUSCULAR | Status: DC | PRN
  Administered 2016-06-13: 2 mg via INTRAVENOUS

## 2016-06-13 MED ORDER — BISACODYL 10 MG RE SUPP: 10.0000 mg | Freq: Every day | RECTAL | Status: DC | PRN

## 2016-06-13 MED ORDER — PROPOFOL 10 MG/ML IV BOLUS
INTRAVENOUS | Status: DC | PRN
  Administered 2016-06-13: 100 mg via INTRAVENOUS

## 2016-06-13 MED ORDER — THROMBIN 20000 UNITS EX SOLR
CUTANEOUS | Status: AC
  Filled 2016-06-13: qty 20000

## 2016-06-13 MED ORDER — EPHEDRINE SULFATE 50 MG/ML IJ SOLN
INTRAMUSCULAR | Status: DC | PRN
  Administered 2016-06-13 (×2): 10 mg via INTRAVENOUS

## 2016-06-13 MED ORDER — ACETAMINOPHEN 650 MG RE SUPP: 650.0000 mg | RECTAL | Status: DC | PRN

## 2016-06-13 MED ORDER — DEXAMETHASONE SODIUM PHOSPHATE 10 MG/ML IJ SOLN
INTRAMUSCULAR | Status: DC | PRN
  Administered 2016-06-13: 10 mg via INTRAVENOUS

## 2016-06-13 MED ORDER — PHENYLEPHRINE HCL 10 MG/ML IJ SOLN
INTRAMUSCULAR | Status: DC | PRN
  Administered 2016-06-13 (×5): 80 ug via INTRAVENOUS
  Administered 2016-06-13: 120 ug via INTRAVENOUS
  Administered 2016-06-13 (×4): 80 ug via INTRAVENOUS

## 2016-06-13 MED ORDER — HYDROCODONE-ACETAMINOPHEN 5-325 MG PO TABS
1.0000 | ORAL_TABLET | ORAL | Status: DC | PRN
Start: 2016-06-13 — End: 2016-06-15
  Administered 2016-06-14 (×2): 1 via ORAL
  Administered 2016-06-14: 2 via ORAL
  Administered 2016-06-14 – 2016-06-15 (×4): 1 via ORAL
  Filled 2016-06-13: qty 1
  Filled 2016-06-13: qty 2
  Filled 2016-06-13 (×5): qty 1

## 2016-06-13 MED ORDER — SODIUM CHLORIDE 0.9% FLUSH: 3.0000 mL | INTRAVENOUS | Status: DC | PRN

## 2016-06-13 MED ORDER — LIDOCAINE-EPINEPHRINE (PF) 2 %-1:200000 IJ SOLN
INTRAMUSCULAR | Status: AC
  Filled 2016-06-13: qty 20

## 2016-06-13 MED ORDER — LACTATED RINGERS IV SOLN
Freq: Once | INTRAVENOUS | Status: AC
  Administered 2016-06-13: 12:00:00 via INTRAVENOUS

## 2016-06-13 SURGICAL SUPPLY — 89 items
ADAPTER DRIVER T25 (MISCELLANEOUS) ×20 IMPLANT
BAG DECANTER FOR FLEXI CONT (MISCELLANEOUS) ×3 IMPLANT
BIT DRILL LONG 3.0X30 (BIT) IMPLANT
BIT DRILL LONG 3X80 (BIT) IMPLANT
BIT DRILL LONG 4X80 (BIT) IMPLANT
BIT DRILL SHORT 3.0X30 (BIT) IMPLANT
BIT DRILL SHORT 3X80 (BIT) IMPLANT
BLADE BN FN 3.2XSTRL LF (MISCELLANEOUS) ×2 IMPLANT
BLADE BONE MILL FINE (MISCELLANEOUS) ×1
BLADE SURG 11 STRL SS (BLADE) ×6 IMPLANT
BUR MATCHSTICK NEURO 3.0 LAGG (BURR) ×3 IMPLANT
BUR PRECISION FLUTE 5.0 (BURR) ×6 IMPLANT
CANISTER SUCT 3000ML PPV (MISCELLANEOUS) ×3 IMPLANT
CARTRIDGE OIL MAESTRO DRILL (MISCELLANEOUS) ×2 IMPLANT
CEMENT BONE KYPHX HV R (Orthopedic Implant) ×3 IMPLANT
CONT SPEC 4OZ CLIKSEAL STRL BL (MISCELLANEOUS) ×6 IMPLANT
COVER BACK TABLE 60X90IN (DRAPES) ×3 IMPLANT
DECANTER SPIKE VIAL GLASS SM (MISCELLANEOUS) ×3 IMPLANT
DERMABOND ADVANCED (GAUZE/BANDAGES/DRESSINGS) ×1
DERMABOND ADVANCED .7 DNX12 (GAUZE/BANDAGES/DRESSINGS) ×2 IMPLANT
DEVICE BONE FILLER KYPHON SZ3 (MISCELLANEOUS) ×8 IMPLANT
DIFFUSER DRILL AIR PNEUMATIC (MISCELLANEOUS) ×3 IMPLANT
DIGITIZER BENDINI (MISCELLANEOUS) ×3 IMPLANT
DRAPE C-ARM 42X72 X-RAY (DRAPES) ×6 IMPLANT
DRAPE C-ARMOR (DRAPES) ×3 IMPLANT
DRAPE LAPAROTOMY 100X72X124 (DRAPES) ×6 IMPLANT
DRAPE POUCH INSTRU U-SHP 10X18 (DRAPES) ×3 IMPLANT
DRAPE SHEET LG 3/4 BI-LAMINATE (DRAPES) ×6 IMPLANT
DRAPE SURG 17X23 STRL (DRAPES) ×3 IMPLANT
DRIVER ADAPTER T25 (MISCELLANEOUS) ×30
DRSG OPSITE POSTOP 4X10 (GAUZE/BANDAGES/DRESSINGS) ×3 IMPLANT
DRSG OPSITE POSTOP 4X8 (GAUZE/BANDAGES/DRESSINGS) ×3 IMPLANT
DURAPREP 26ML APPLICATOR (WOUND CARE) ×3 IMPLANT
ELECT REM PT RETURN 9FT ADLT (ELECTROSURGICAL) ×3
ELECTRODE REM PT RTRN 9FT ADLT (ELECTROSURGICAL) ×2 IMPLANT
GAUZE SPONGE 4X4 16PLY XRAY LF (GAUZE/BANDAGES/DRESSINGS) IMPLANT
GLOVE BIOGEL PI IND STRL 7.5 (GLOVE) ×8 IMPLANT
GLOVE BIOGEL PI IND STRL 8.5 (GLOVE) ×2 IMPLANT
GLOVE BIOGEL PI INDICATOR 7.5 (GLOVE) ×4
GLOVE BIOGEL PI INDICATOR 8.5 (GLOVE) ×1
GLOVE ECLIPSE 7.0 STRL STRAW (GLOVE) ×9 IMPLANT
GLOVE ECLIPSE 9.0 STRL (GLOVE) ×6 IMPLANT
GLOVE INDICATOR 7.5 STRL GRN (GLOVE) ×6 IMPLANT
GLOVE SURG SS PI 6.5 STRL IVOR (GLOVE) ×6 IMPLANT
GLOVE SURG SS PI 7.0 STRL IVOR (GLOVE) ×6 IMPLANT
GOWN STRL REUS W/ TWL LRG LVL3 (GOWN DISPOSABLE) ×12 IMPLANT
GOWN STRL REUS W/ TWL XL LVL3 (GOWN DISPOSABLE) ×4 IMPLANT
GOWN STRL REUS W/TWL 2XL LVL3 (GOWN DISPOSABLE) ×3 IMPLANT
GOWN STRL REUS W/TWL LRG LVL3 (GOWN DISPOSABLE) ×6
GOWN STRL REUS W/TWL XL LVL3 (GOWN DISPOSABLE) ×2
GRAFT BN 10X1XDBM MAGNIFUSE (Bone Implant) ×4 IMPLANT
GRAFT BN 5X1XSPNE CVD POST DBM (Bone Implant) ×2 IMPLANT
GRAFT BONE MAGNIFUSE 1X10CM (Bone Implant) ×2 IMPLANT
GRAFT BONE MAGNIFUSE 1X5CM (Bone Implant) ×1 IMPLANT
GUIDEWIRE 18IN BLUNT CD HORIZ (WIRE) ×27 IMPLANT
HEMOSTAT POWDER KIT SURGIFOAM (HEMOSTASIS) ×3 IMPLANT
KIT BASIN OR (CUSTOM PROCEDURE TRAY) ×3 IMPLANT
KIT POSITION SURG JACKSON T1 (MISCELLANEOUS) ×3 IMPLANT
KIT ROOM TURNOVER OR (KITS) ×3 IMPLANT
KIT SPINE MAZOR X ROBO DISP (MISCELLANEOUS) ×3 IMPLANT
KYPHON BFD (MISCELLANEOUS) ×12
NEEDLE HYPO 25X1 1.5 SAFETY (NEEDLE) ×6 IMPLANT
NEEDLE SPNL 18GX3.5 QUINCKE PK (NEEDLE) ×3 IMPLANT
NS IRRIG 1000ML POUR BTL (IV SOLUTION) ×3 IMPLANT
OIL CARTRIDGE MAESTRO DRILL (MISCELLANEOUS) ×3
PACK LAMINECTOMY NEURO (CUSTOM PROCEDURE TRAY) ×3 IMPLANT
PAD ARMBOARD 7.5X6 YLW CONV (MISCELLANEOUS) ×9 IMPLANT
PIN HEAD 2.5X60MM (PIN) IMPLANT
ROD 5.5MM SPINAL SOLERA (Rod) ×6 IMPLANT
ROD CROSSLINK SPINAL 30-34MM (Rod) ×6 IMPLANT
SCREW LOCK RELINE 5.5 TULIP (Screw) ×12 IMPLANT
SCREW SCHANZ SA 4.0MM IMPLANT
SCREW SET SOLERA (Screw) ×10 IMPLANT
SCREW SET SOLERA TI5.5 (Screw) ×20 IMPLANT
SCREW SOLERA 4.5X30 (Screw) ×6 IMPLANT
SCREW SOLERA 4.5X35 (Screw) ×18 IMPLANT
SCREW SOLERA 4.5X40 (Screw) ×6 IMPLANT
SPONGE SURGIFOAM ABS GEL 100 (HEMOSTASIS) ×3 IMPLANT
STAPLER VISISTAT 35W (STAPLE) ×3 IMPLANT
STRIP CLOSURE SKIN 1/2X4 (GAUZE/BANDAGES/DRESSINGS) IMPLANT
SUT VIC AB 0 CT1 18XCR BRD8 (SUTURE) ×12 IMPLANT
SUT VIC AB 0 CT1 8-18 (SUTURE) ×6
SUT VIC AB 3-0 FS2 27 (SUTURE) IMPLANT
SUT VICRYL 3-0 RB1 18 ABS (SUTURE) ×3 IMPLANT
TOWEL OR 17X24 6PK STRL BLUE (TOWEL DISPOSABLE) ×3 IMPLANT
TOWEL OR 17X26 10 PK STRL BLUE (TOWEL DISPOSABLE) ×3 IMPLANT
TRAY FOLEY W/METER SILVER 16FR (SET/KITS/TRAYS/PACK) ×3 IMPLANT
TUBE CONNECTING 12X1/4 (SUCTIONS) ×3 IMPLANT
WATER STERILE IRR 1000ML POUR (IV SOLUTION) ×3 IMPLANT

## 2016-06-13 NOTE — Progress Notes (Signed)
[facility] Progress Note Patient Name: RITIKA HELLICKSON DOB: Nov 24, 1945 MRN: [id number]   Date of Service  06/13/2016  HPI/Events of Note  New patient evaluation, T5 surgery with loose screws in hardware admitted by NS.  eICU Interventions  Nothing further to add.     Intervention Category Major Interventions: Other:  YACOUB,WESAM 06/13/2016, 10:49 PM

## 2016-06-13 NOTE — Anesthesia Postprocedure Evaluation (Signed)
Anesthesia Post Note  Patient: Heather [name]  Procedure(s) Performed: Procedure(s) (LRB): REMOVAL OF HARDWARE THORACIC SIX , THORACIC SEVEN , PEDICLE SCREW STABILIZATION THORACIC EIGHT - [id number] TEN (N/A) APPLICATION OF ROBOTIC ASSISTANCE FOR SPINAL PROCEDURE (N/A)  Patient location during evaluation: PACU Anesthesia Type: General Level of consciousness: awake and alert, oriented and patient cooperative Pain management: pain level controlled Vital Signs Assessment: post-procedure vital signs reviewed and stable Respiratory status: spontaneous breathing, nonlabored ventilation, respiratory function stable and patient connected to nasal cannula oxygen Cardiovascular status: blood pressure returned to baseline and stable Postop Assessment: no signs of nausea or vomiting Anesthetic complications: no       Last Vitals:  Vitals:   06/13/16 2145 06/13/16 2200  BP: 133/72 136/78  Pulse: (!) 115 (!) 111  Resp: 10 14  Temp:      Last Pain:  Vitals:   06/13/16 2200  TempSrc:   PainSc: 2                  Vinessa Macconnell,E. Nohely Whitehorn

## 2016-06-13 NOTE — Transfer of Care (Signed)
Immediate Anesthesia Transfer of Care Note  Patient: Heather [name]  Procedure(s) Performed: Procedure(s): REMOVAL OF HARDWARE THORACIC SIX , THORACIC SEVEN , PEDICLE SCREW STABILIZATION THORACIC EIGHT - [id number] TEN (N/A) APPLICATION OF ROBOTIC ASSISTANCE FOR SPINAL PROCEDURE (N/A)  Patient Location: PACU  Anesthesia Type:General  Level of Consciousness: awake following commands  Airway & Oxygen Therapy: Patient Spontanous Breathing and Patient connected to nasal cannula oxygen  Post-op Assessment: Report given to RN, Post -op Vital signs reviewed and stable and Patient moving all extremities  Post vital signs: Reviewed and stable  Last Vitals:  Vitals:   06/13/16 1048  BP: (!) 142/66  Pulse: 74  Resp: 18  Temp: 36.6 C    Last Pain:  Vitals:   06/13/16 1048  TempSrc: Oral         Complications: No apparent anesthesia complications

## 2016-06-13 NOTE — Progress Notes (Signed)
Pt c/o itching all over body after using the 6 cloth CHG wipes.  Mild redness at posterior neck and LLQ abdomen.  Nurse Tech assisted pt to wipe off CHG with water.  Dr. Ola Spurr made aware and ordered to give 12.'5mg'$  benedryl.  Monitoring.

## 2016-06-13 NOTE — H&P (Signed)
CC:  Hardware failure  HPI: Heather [name] is a 71 y.o. female with a history of metastatic breast CA with prior deocmpression and stabilization at T5, instrumentation from T2-T7. Repeat imaging has demonstrated pullout of the lower screws and progressive kyphosis. We reviewed options and she presents for revision and extension of hardware.  PMH: Past Medical History:  Diagnosis Date  . Anemia    hx of  . Anxiety    r/t updated surgery  . Breast cancer ([place])    right  . Cataract    immature-not sure of which eye  . Complication of anesthesia    pt states she is sensitive to meds  . Dizziness    has been going on for 34month;medical MD aware  . GERD (gastroesophageal reflux disease)    Tums prn  . H. pylori infection   . H/O hiatal hernia   . Hemorrhoids   . History of UTI   . Hx of radiation therapy 10/25/12- 12/11/12   right chest wall/regional lymph nodes 5040 cGy, 28 sessions, right chest wall boost 1000 cGy 1 session  . Hx: UTI (urinary tract infection)   . Hyperlipidemia    but not on meds;diet and exercise controlled  . Hypertension    recently started Aldactone   . Insomnia    takes Melatoniin daily  . Metastasis to spinal column ([facility])    T5, Left  Femur cancer- radiation   . [place]    "[place]"  . PONV (postoperative nausea and vomiting)    pt experienced hair loss, confusion and combative- 02/15/15  . Right knee pain   . Right shoulder pain   . Skin cancer    squamous, Nodule to back - "same cancer as breast."  . Urinary frequency    d/t taking Aldactone    PSH: Past Surgical History:  Procedure Laterality Date  . COLONOSCOPY    . cyst removed from left breast  1970  . DECOMPRESSIVE LUMBAR LAMINECTOMY LEVEL 4 N/A 02/15/2015   Procedure: DECOMPRESSION T5 AND T3-T7 STABALIZATION;  Surgeon: NConsuella Lose MD;  [facility];  Service: Neurosurgery;  Laterality: N/A;  . ESOPHAGOGASTRODUODENOSCOPY    . LAMINECTOMY N/A 03/27/2015    Procedure: Thoracic Four-Thoracic Six Laminectomy for tumor;  Surgeon: NConsuella Lose MD;  [facility];  Service: Neurosurgery;  Laterality: N/A;  T4-T6 Laminectomy  . left wrist surgery   2004   with plate  . MASTECTOMY MODIFIED RADICAL  05/10/2012   Procedure: MASTECTOMY MODIFIED RADICAL;  Surgeon: PMerrie Roof MD;  [facility]  Service: General;  Laterality: Right;  RIGHT MODIFIED RADICAL MASTECTOMY  . MASTECTOMY, RADICAL Right   . MINOR BREAST BIOPSY Left 05/16/2016   Procedure: EXCISION OF BACK NODULE;  Surgeon: PAutumn MessingIII, MD;  [facility] [facility]  Service: General;  Laterality: Left;  . Nodule  back  05/17/2015  . PORTACATH PLACEMENT  05/10/2012   Procedure: INSERTION PORT-A-CATH;  Surgeon: PMerrie Roof MD;  [facility];  Service: General;  Laterality: Left;    SH: Social History  Substance Use Topics  . Smoking status: Never Smoker  . Smokeless tobacco: Never Used  . Alcohol use No    MEDS: Prior to Admission medications   Medication Sig Start Date End Date Taking? Authorizing Provider  b complex vitamins tablet Take 1 tablet by mouth daily.   Yes Historical Provider, MD  Calcium Carbonate Antacid (TUMS PO) Take 2  tablets by mouth 2 (two) times daily as needed (acid reflux).   Yes Historical Provider, MD  Cholecalciferol (VITAMIN D) 2000 UNITS tablet Take 2,000 Units by mouth daily.   Yes Historical Provider, MD  Coenzyme Q10 (COQ10) 100 MG CAPS Take 300 mg by mouth at bedtime.    Yes Historical Provider, MD  gabapentin (NEURONTIN) 600 MG tablet Take 1 tablet (600 mg total) by mouth as directed. Take 1 tablet in the AM and afternoon and 1 1/2 tabs at bedtime 04/20/16  Yes Meredith Staggers, MD  lidocaine-prilocaine (EMLA) cream Apply to affected area once Patient taking differently: Apply 1 application topically every 21 ( twenty-one) days. Apply to port prior to infusions 03/23/15  Yes [name], MD  OVER THE COUNTER  MEDICATION Place 1 drop into both eyes 2 (two) times daily as needed (dry eyes). Over the counter lubricating eye drops   Yes Historical Provider, MD  polyethylene glycol (MIRALAX / GLYCOLAX) packet Take 17 g by mouth 2 (two) times daily. Patient taking differently: Take 17 g by mouth daily as needed for moderate constipation.  03/11/15  Yes Ivan Anchors Love, PA-C  pravastatin (PRAVACHOL) 10 MG tablet Take 10 mg by mouth every Monday, Wednesday, and Friday.    Yes Historical Provider, MD  Probiotic Product (PROBIOTIC DAILY PO) Take 1 capsule by mouth daily.    Yes Historical Provider, MD  ranitidine (ZANTAC) 300 MG tablet Take 300 mg by mouth at bedtime as needed. Reported on 06/25/2015   Yes Historical Provider, MD  spironolactone (ALDACTONE) 25 MG tablet Take 0.5 tablets (12.5 mg total) by mouth daily. 04/15/13  Yes Minette Headland, NP  traMADol (ULTRAM) 50 MG tablet Take 1-2 tablets (50-100 mg total) by mouth every 6 (six) hours as needed. 05/16/16  Yes Autumn Messing III, MD  prochlorperazine (COMPAZINE) 10 MG tablet Take 10 mg by mouth every 6 (six) hours as needed for nausea or vomiting.    Historical Provider, MD    ALLERGY: Allergies  Allergen Reactions  . Codeine     " loopy".   02/26/15: Also patient does not want to take oxycodone either (makes her feel like a "zombie")  . Keflex [Cephalexin]     GI Upset  . Naprosyn [Naproxen] Other (See Comments)    GI ipset   . Oxycodone Other (See Comments)    Made her feel like a "zombie" 02/25/15. Patient does not want to take oxycodone.  . Tramadol Itching  . Tussin [Guaifenesin] Other (See Comments)    Dizzy     ROS: ROS  NEUROLOGIC EXAM: Awake, alert, oriented Memory and concentration grossly intact Speech fluent, appropriate CN grossly intact Motor exam: Upper Extremities Deltoid Bicep Tricep Grip  Right 5/5 5/5 5/5 5/5  Left 5/5 5/5 5/5 5/5   Lower Extremity IP Quad PF DF EHL  Right 5/5 5/5 5/5 5/5 5/5  Left 5/5 5/5 5/5 5/5 5/5    Sensation grossly intact to LT  IMGAING: CT demonstrates progressive kyphosis with pullout of the bilateral T6 and T7 screws. Collapse of T5  IMPRESSION: - 71 y.o. female who has doe very well postoperatively from a neurologic standpoint, now walking, but has developed progressive kyphosis and failure at the lower end of the construct.  PLAN: - Removal of T6-7, New screws at T8-11, possible new screw at T2, possible cement augmentation.  I have reviewed in detail with the patient and her son over numerous conversations, the plan above. They understand fully the risks,  benefits, and alternative treatment strategies. All questions were answered and she is willing to proceed as above.

## 2016-06-13 NOTE — Anesthesia Preprocedure Evaluation (Addendum)
Anesthesia Evaluation  Patient identified by MRN, date of birth, ID band Patient awake    Reviewed: Allergy & Precautions, H&P , NPO status , Patient's Chart, lab work & pertinent test results  History of Anesthesia Complications (+) PONV  Airway Mallampati: II  TM Distance: >3 FB Neck ROM: Full    Dental no notable dental hx. (+) Teeth Intact, Dental Advisory Given   Pulmonary neg pulmonary ROS,    Pulmonary exam normal breath sounds clear to auscultation       Cardiovascular hypertension, On Medications  Rhythm:Regular Rate:Normal     Neuro/Psych Anxiety negative neurological ROS  negative psych ROS   GI/Hepatic Neg liver ROS, GERD  Medicated and Controlled,  Endo/Other  negative endocrine ROS  Renal/GU negative Renal ROS  negative genitourinary   Musculoskeletal   Abdominal   Peds  Hematology negative hematology ROS (+)   Anesthesia Other Findings   Reproductive/Obstetrics negative OB ROS                            Anesthesia Physical Anesthesia Plan  ASA: II  Anesthesia Plan: General   Post-op Pain Management:    Induction: Intravenous  Airway Management Planned: Oral ETT  Additional Equipment:   Intra-op Plan:   Post-operative Plan: Extubation in OR  Informed Consent: I have reviewed the patients History and Physical, chart, labs and discussed the procedure including the risks, benefits and alternatives for the proposed anesthesia with the patient or authorized representative who has indicated his/her understanding and acceptance.   Dental advisory given  Plan Discussed with: CRNA  Anesthesia Plan Comments:         Anesthesia Quick Evaluation

## 2016-06-14 LAB — TYPE AND SCREEN
ABO/RH(D): B POS
Antibody Screen: NEGATIVE
Unit division: 0
Unit division: 0

## 2016-06-14 LAB — BASIC METABOLIC PANEL
Anion gap: 8 (ref 5–15)
BUN: 14 mg/dL (ref 6–20)
CO2: 26 mmol/L (ref 22–32)
Calcium: 8.2 mg/dL — ABNORMAL LOW (ref 8.9–10.3)
Chloride: 103 mmol/L (ref 101–111)
Creatinine, Ser: 0.82 mg/dL (ref 0.44–1.00)
GFR calc Af Amer: >60 mL/min (ref >60)
GFR calc non Af Amer: >60 mL/min (ref >60)
Glucose, Bld: 181 mg/dL — ABNORMAL HIGH (ref 65–99)
Potassium: 4.2 mmol/L (ref 3.5–5.1)
Sodium: 137 mmol/L (ref 135–145)

## 2016-06-14 LAB — CBC
HCT: 31.2 % — ABNORMAL LOW (ref 36.0–46.0)
Hemoglobin: 10.4 g/dL — ABNORMAL LOW (ref 12.0–15.0)
MCH: 28.6 pg (ref 26.0–34.0)
MCHC: 33.3 g/dL (ref 30.0–36.0)
MCV: 85.7 fL (ref 78.0–100.0)
Platelets: 117 10*3/uL — ABNORMAL LOW (ref 150–400)
RBC: 3.64 MIL/uL — ABNORMAL LOW (ref 3.87–5.11)
RDW: 13.9 % (ref 11.5–15.5)
WBC: 6.4 10*3/uL (ref 4.0–10.5)

## 2016-06-14 MED ORDER — FAMOTIDINE 20 MG PO TABS
20.0000 mg | ORAL_TABLET | Freq: Once | ORAL | Status: AC
  Administered 2016-06-14: 20 mg via ORAL

## 2016-06-14 MED ORDER — VANCOMYCIN HCL IN DEXTROSE 1-5 GM/200ML-% IV SOLN
1000.0000 mg | Freq: Once | INTRAVENOUS | Status: AC
  Administered 2016-06-14: 1000 mg via INTRAVENOUS
  Filled 2016-06-14: qty 200

## 2016-06-14 MED ORDER — FAMOTIDINE 20 MG PO TABS
ORAL_TABLET | ORAL | Status: AC
  Filled 2016-06-14: qty 1

## 2016-06-14 MED ORDER — CHLORHEXIDINE GLUCONATE CLOTH 2 % EX PADS
6.0000 | MEDICATED_PAD | Freq: Every day | CUTANEOUS | Status: DC
  Administered 2016-06-14: 6 via TOPICAL

## 2016-06-14 MED ORDER — SODIUM CHLORIDE 0.9% FLUSH
10.0000 mL | INTRAVENOUS | Status: DC | PRN
  Administered 2016-06-15: 10 mL
  Filled 2016-06-14: qty 40

## 2016-06-14 MED ORDER — WHITE PETROLATUM GEL
Status: AC
  Administered 2016-06-14
  Filled 2016-06-14: qty 1

## 2016-06-14 NOTE — Progress Notes (Signed)
Physical Therapy Evaluation Patient Details Name: Heather [name] MRN: [id number] DOB: [birth year] Today's Date: 06/14/2016   History of Present Illness  Pt is a 71 y.o. female with history of metastatic breast CA with prior decompression and stabilization at T5, instrumentation from T2-T7, HTN, dizziness, metastasis to spinal column and L femur presents s/p removal and extension of hardware at T6-7 and pedicle screw stabilization at T8-10 on 06/13/16. Imaging demonstrated pullout of the lower screws and progressive kyphosis.  Clinical Impression  Pt presents to acute PT with increased pain, generalized weakness, and impaired mobility s/p above surgery. PTA, pt required assist for household amb with RW and community amb with manual w/c. Pt has accessible home environment and will have 24/7 supervision from husband upon d/c. Today pt was able to amb with min guard and RW, motivated to participate with Pt; main limiting factors were dizziness and low BP. Pt would benefit from continued skilled acute PT services for education on brace don/doffing, precautions, and gait training. Recommend HHPT services post-d/c for continued functional mobility training and to improve independence.     Follow Up Recommendations Supervision/Assistance - 24 hour;Home health PT    Equipment Recommendations  None recommended by PT    Recommendations for Other Services       Precautions / Restrictions Precautions Precautions: Back Precaution Booklet Issued: No Precaution Comments: Reviewed back precautions Required Braces or Orthoses: Spinal Brace Spinal Brace: Thoracolumbosacral orthotic Restrictions Weight Bearing Restrictions: No Other Position/Activity Restrictions: Pt demonstrates understanding of back precautions.      Mobility  Bed Mobility               General bed mobility comments: Sitting up in chair upon arrival  Transfers Overall transfer level: Needs assistance Equipment used: Rolling  walker (2 wheeled) Transfers: Sit to/from Stand Sit to Stand: Min guard         General transfer comment: STS from chair x2, with verbal cues for handplacement with RW   Ambulation/Gait Ambulation/Gait assistance: Min guard Ambulation Distance (Feet): 60 Feet (+90') Assistive device: Rolling walker (2 wheeled) Gait Pattern/deviations: Decreased stride length;Step-through pattern;Decreased step length - right;Decreased step length - left     General Gait Details: Pt c/o dizziness, which subsided with seated rest break. Slowed gait speed overall; no LOB.  Stairs            Wheelchair Mobility    Modified Rankin (Stroke Patients Only)       Balance Overall balance assessment: Needs assistance Sitting-balance support: No upper extremity supported;Feet supported Sitting balance-Leahy Scale: Fair     Standing balance support: Bilateral upper extremity supported Standing balance-Leahy Scale: Fair Standing balance comment: Pt required at least one UE support on RW.                              Pertinent Vitals/Pain Pain Assessment: 0-10 Pain Score: 4  Pain Location: Back and neck  Pain Descriptors / Indicators: Aching Pain Intervention(s): Limited activity within patient's tolerance;Monitored during session;Premedicated before session    Home Living Family/patient expects to be discharged to:: Private residence Living Arrangements: Spouse/significant other Available Help at Discharge: Family;Available 24 hours/day Type of Home: House Home Access: Level entry     Home Layout: One level Home Equipment: Walker - 2 wheels;Wheelchair - [employer];Shower seat;Crutches;Cane - single point      Prior Function Level of Independence: Needs assistance   Gait / Transfers Assistance Needed: Uses w/c  for community ambulation. Pt unable to consistently amb indep, requires crutches, cane, or walker.  ADL's / Homemaking Assistance Needed: Requires  assist from husband for ADLs, including transfers and bathing.         Hand Dominance   Dominant Hand: Right    Extremity/Trunk Assessment   Upper Extremity Assessment Upper Extremity Assessment: Defer to OT evaluation    Lower Extremity Assessment Lower Extremity Assessment: Generalized weakness    Cervical / Trunk Assessment Cervical / Trunk Assessment: Other exceptions Cervical / Trunk Exceptions: s/p spine surgery  Communication   Communication: No difficulties  Cognition Arousal/Alertness: Awake/alert Behavior During Therapy: WFL for tasks assessed/performed Overall Cognitive Status: Within Functional Limits for tasks assessed                      General Comments General comments (skin integrity, edema, etc.): Pt c/o of dizziness with amb and BP decreased to 83/44; recovered with seated rest break.     Exercises     Assessment/Plan    PT Assessment Patient needs continued PT services  PT Problem List Decreased strength;Decreased activity tolerance;Decreased mobility;Decreased knowledge of precautions;Pain;Cardiopulmonary status limiting activity;Decreased balance          PT Treatment Interventions Gait training;Therapeutic exercise;Therapeutic activities;Patient/family education;Functional mobility training    PT Goals (Current goals can be found in the Care Plan section)  Acute Rehab PT Goals Patient Stated Goal: Going home PT Goal Formulation: With patient Time For Goal Achievement: 06/28/16 Potential to Achieve Goals: Good    Frequency Min 5X/week   Barriers to discharge        Co-evaluation      End of Session Equipment Utilized During Treatment: Gait belt;Back brace Activity Tolerance: Patient tolerated treatment well Patient left: in chair;with call bell/phone within reach;with family/visitor present Nurse Communication: Mobility status         Time: 6168-3729 PT Time Calculation (min) (ACUTE ONLY): 25 min   Charges:    PT Evaluation $PT Eval Moderate Complexity: 1 Procedure PT Treatments $Gait Training: 8-22 mins   PT G Codes:        Mabeline Caras June 24, 2016, 3:53 PM Enis Gash, SPT [phone]

## 2016-06-14 NOTE — Progress Notes (Addendum)
No issues overnight.  Feels well overall.  Eating and drinking well.  Still has catheter in. Pain is well controlled on current meds.  Does have some pain at site of incision. No radicular symptoms  Denies new neurological symptoms  EXAM:  BP (!) 106/55   Pulse 83   Temp 97.7 F (36.5 C) (Oral)   Resp 17   Ht 5' 3.5" (1.613 m)   Wt 62.6 kg (138 lb 0.1 oz)   SpO2 100%   BMI 24.06 kg/m   Awake, alert, oriented  Speech fluent, appropriate  CN grossly intact  5/5 BUE/BLE  Incision: trace blood on dressing. No signs of infection. No drainage.   IMPRESSION:  71 y.o. female with recent corrective surgery appears to be doing well.  PLAN: - Transfer to [facility] - PT/OT eval.  - Will consider d/c tomorrow pending evaluation.

## 2016-06-14 NOTE — Progress Notes (Signed)
Orthopedic Tech Progress Note Patient Details:  Heather [name] 1946/01/19 [id number]  Patient ID: Heather [name], female   DOB: 11-Mar-1946, 72 y.o.   MRN: [id number]   Heather [name] 06/14/2016, 9:07 AMCalled [employer] for TLSO.

## 2016-06-14 NOTE — Care Management Note (Signed)
Case Management Note  Patient Details  Name: Heather [name] MRN: [id number] Date of Birth: [birth year]  Subjective/Objective:  Pt admitted on 06/13/16 s/p removal of hardware T6-7, stabilization of T8-T10.  PTA, pt independent, lives with spouse.                  Action/Plan: Met with pt and family, at bedside.  Pt has ambulated without difficulty around unit.  She states she has all needed equipment at home, and denies any home needs at this time.  Family members to provide 24h supervision at dc.    Expected Discharge Date:                  Expected Discharge Plan:  Home/Self Care  In-House Referral:     Discharge planning Services  CM Consult  Post Acute Care Choice:    Choice offered to:     DME Arranged:    DME Agency:     HH Arranged:    [place] Agency:     Status of Service:  Completed, signed off  If discussed at [employer] of Stay Meetings, dates discussed:    Additional Comments:  Reinaldo Raddle, RN, BSN  Trauma/Neuro ICU Case Manager [phone]

## 2016-06-15 MED ORDER — HYDROCODONE-ACETAMINOPHEN 5-325 MG PO TABS: 1.0000 | ORAL_TABLET | ORAL | 0 refills | Status: DC | PRN

## 2016-06-15 MED ORDER — HEPARIN SOD (PORK) LOCK FLUSH 100 UNIT/ML IV SOLN
500.0000 [IU] | INTRAVENOUS | Status: AC | PRN
  Administered 2016-06-15: 500 [IU]

## 2016-06-15 NOTE — Op Note (Signed)
PREOP DIAGNOSIS:  1. Hardware failure, T6, T7 2. Progressive thoracic kyphosis 3. Pseudoarthrosis   POSTOP DIAGNOSIS: Same  PROCEDURE: 1. Removal of instrumentation, bilateral T6, T7 pedicle screws, bilateral rods 2. Placement of Pedicle screws T2, T8, T9, T10, T11 - [employer] Solera 3. Posterior arthrodesis, T2-T11 4. Use of non-structural bone allograft 5. Use of robotic guidance for screw placement  SURGEON: Dr. Consuella Lose, MD  ASSISTANT: Dr. Doreatha Lew. Pool, MD  ANESTHESIA: General Endotracheal  EBL: 1150cc  SPECIMENS: None  DRAINS: None  COMPLICATIONS: None immediate  CONDITION: Stable to PACU  HISTORY: Heather [name] is a 71 y.o. female With a history of metastatic breast cancer to T5.  She has previously undergone decompression and instrumentation from T3-T7.  Follow-up has demonstrated pullout of the T6 and T7 screws with progressive kyphosis due to continued collapse at T5.  Treatment options were discussed, and she has actually also obtained 2nd opinion from [facility].  She has ultimately elected to proceed with revision of the posterior instrumentation without placement of an anterior construct.  Risks and benefits of the surgery were explained in detail to both the patient and her son.  After all questions were answered informed consent was obtained and witnessed.  PROCEDURE IN DETAIL: After informed consent was obtained and witnessed, the patient was brought to the operating room. After induction of general anesthesia, the patient was positioned on the operative table in the prone position. All pressure points were meticulously padded. Previous skin incision was then marked out and prepped and draped in the usual sterile fashion.  After time-out was conducted, the incision was infiltrated with local anesthetic with epinephrine.  Incision was then made sharply and carried down through the thoracodorsal fascia.  Spinous processes were identified.  The  previously placed screws at T3, T4, T6, and T7 were identified as was the rod.  Dissection was carried superiorly to identify the T2 lamina and the facet complex.  Similarly, dissection was carried inferiorly to identify T8, T9, T10, and T11 lamina and pedicle screw entry points.  At this point, the set screws were removed from the previously placed pedicle screws, and the rods were removed.   AP and lateral fluoroscopic images were then taken and code registered with the Mazor robotic system with good accuracy.   Previously planned pedicle screw trajectories were then used with the robot to place new pedicle screw trajectories at T8, T9, T10, and T11 bilaterally.   K-wires were placed.  Trajectories were checked with AP and lateral fluoroscopy.  Screws were then inserted over the K-wires. Using AP and lateral fluoroscopy, the T2 pedicles with identified and pedicle screws placed.    At this point, the [place] was used to contour bilateral rods without application of any change in thoracic kyphosis to minimize strain on the pedicle screws.    Towers were then placed over the new pedicle screws at T2, T8 through T11, and approximately 1.5 cc of PMMA cement was injected at each level.  The towers were then removed.    Previously bent rod was then placed bilaterally, setscrews were placed and final tightened.   Crosslinks were placed at the upper and lower ends of the construct. Final AP and lateral fluoroscopic images confirmed good position of the construct.    Exposed bone surfaces across the spinous process and lamina were decorticated with a high-speed drill from T2 down to T4, over the  Transverse process and rib heads at T5-T7, and over  the spinous process and lamina from T8-T11.  These areas were then covered with Magnifuse.  At this point the wound is irrigated with copious amounts of normal saline irrigation.   The wound was then closed in multiple layers with interrupted 0 Vicryl  stitches.  Skin was closed with staples.  Bacitracin ointment and sterile dressing was applied.  At the end of the case all sponge, needle, instrument, and cottonoid counts were correct.  The patient was then transferred to the stretcher and extubated.  She was taken to the postanesthesia care unit in stable hemodynamic condition.

## 2016-06-15 NOTE — Discharge Summary (Signed)
Physician Discharge Summary  Patient ID: GERALDINE SANDBERG MRN: [id number] DOB/AGE: 1945/07/12 71 y.o.  Admit date: 06/13/2016 Discharge date: 06/15/2016  Admission Diagnoses:  Thoracic kyphosis Hardware failure  Discharge Diagnoses:  Same Active Problems:   Kyphosis of thoracic region   Discharged Condition: Stable  Hospital Course:  ALICIA SEIB is a 71 y.o. female electively admitted after uncomplicated revision of previous thoracic instrumentation. She had a largely uneventful hospital course, monitored for a day in the ICU and a day on the floor. She was evaluated by PT/OT and appeared safe for d/c home. She was ambulating well with rolling walker, tolerating diet, voiding normally, with pain under control.  Treatments: Surgery - T2-T11 instrumented fusion, removal of previous hardware.  Discharge Exam: Blood pressure (!) 121/48, pulse 97, temperature 99.2 F (37.3 C), temperature source Oral, resp. rate 20, height 5' 3.5" (1.613 m), weight 62.6 kg (138 lb 0.1 oz), SpO2 98 %. Awake, alert, oriented Speech fluent, appropriate CN grossly intact 5/5 BUE/BLE Wound c/d/i  Disposition: 01-Home or Self Care  Discharge Instructions    Call MD for:  redness, tenderness, or signs of infection (pain, swelling, redness, odor or green/yellow discharge around incision site)    Complete by:  As directed    Call MD for:  temperature >100.4    Complete by:  As directed    Diet - low sodium heart healthy    Complete by:  As directed    Discharge instructions    Complete by:  As directed    Walk at home as much as possible, at least 4 times / day   Increase activity slowly    Complete by:  As directed    May shower / Bathe    Complete by:  As directed    48 hours after surgery   May walk up steps    Complete by:  As directed    No dressing needed    Complete by:  As directed    Other Restrictions    Complete by:  As directed    No bending/twisting at waist     Allergies as of  06/15/2016      Reactions   Other Itching   Chlorhexadine Cloth wipes   Acyclovir And Related    Codeine    " loopy".   02/26/15: Also patient does not want to take oxycodone either (makes her feel like a "zombie")   Keflex [cephalexin]    GI Upset   Naprosyn [naproxen] Other (See Comments)   GI ipset    Oxycodone Other (See Comments)   Made her feel like a "zombie" 02/25/15. Patient does not want to take oxycodone.   Tramadol Itching   Tussin [guaifenesin] Other (See Comments)   Dizzy      Medication List    STOP taking these medications   traMADol 50 MG tablet Commonly known as:  ULTRAM     TAKE these medications   b complex vitamins tablet Take 1 tablet by mouth daily.   CoQ10 100 MG Caps Take 300 mg by mouth at bedtime.   gabapentin 600 MG tablet Commonly known as:  NEURONTIN Take 1 tablet (600 mg total) by mouth as directed. Take 1 tablet in the AM and afternoon and 1 1/2 tabs at bedtime   HYDROcodone-acetaminophen 5-325 MG tablet Commonly known as:  NORCO/VICODIN Take 1-2 tablets by mouth every 4 (four) hours as needed (mild pain).   lidocaine-prilocaine cream Commonly known as:  EMLA Apply to  affected area once What changed:  how much to take  how to take this  when to take this  additional instructions   OVER THE COUNTER MEDICATION Place 1 drop into both eyes 2 (two) times daily as needed (dry eyes). Over the counter lubricating eye drops   polyethylene glycol packet Commonly known as:  MIRALAX / GLYCOLAX Take 17 g by mouth 2 (two) times daily. What changed:  when to take this  reasons to take this   pravastatin 10 MG tablet Commonly known as:  PRAVACHOL Take 10 mg by mouth every Monday, Wednesday, and Friday.   PROBIOTIC DAILY PO Take 1 capsule by mouth daily.   prochlorperazine 10 MG tablet Commonly known as:  COMPAZINE Take 10 mg by mouth every 6 (six) hours as needed for nausea or vomiting.   ranitidine 300 MG tablet Commonly  known as:  ZANTAC Take 300 mg by mouth at bedtime as needed. Reported on 06/25/2015   spironolactone 25 MG tablet Commonly known as:  ALDACTONE Take 0.5 tablets (12.5 mg total) by mouth daily.   TUMS PO Take 2 tablets by mouth 2 (two) times daily as needed (acid reflux).   Vitamin D 2000 units tablet Take 2,000 Units by mouth daily.      Follow-up Information    Jaquelin Meaney, C, MD Follow up in 3 day(s).   Specialty:  Neurosurgery Contact information: [address] [place] [address] [place] [address] [phone]           Signed: Jairo Ben 06/15/2016, 8:58 AM

## 2016-06-15 NOTE — Evaluation (Signed)
Occupational Therapy Evaluation Patient Details Name: Heather [name] MRN: [id number] DOB: December 12, 1945 Today's Date: 06/15/2016    History of Present Illness Pt is a 70 y.o. female with history of metastatic breast CA with prior deocmpression and stabilization at T5, instrumentation from T2-T7, HTN, dizziness, metastasis to spinal column and L femur, and is s/p removal of hardware at T6-7, pedicle screw stabilization at T8-10. Imaging demonstrated pullout of the lower screws and progressive kyphosis.     Clinical Impression   Patient is s/p s/p removal of hardware at T6-7, pedicle screw stabilization at T8-10 surgery resulting in functional limitations due to the deficits listed below (see OT problem list). Pt currently requires (A) with TLSO brace to tighten. OT calling [employer] to request brace be cut to avoid pressure on port site. Pt with brace on hips reports pressure on port.  Patient will benefit from skilled OT acutely to increase independence and safety with ADLS to allow discharge home .     Follow Up Recommendations  No OT follow up    Equipment Recommendations  None recommended by OT    Recommendations for Other Services       Precautions / Restrictions Precautions Precautions: Back Precaution Comments: Reviewed back precautions with ADLS Required Braces or Orthoses: Spinal Brace Spinal Brace: Thoracolumbosacral orthotic      Mobility Bed Mobility Overal bed mobility: Modified Independent                Transfers Overall transfer level: Modified independent Equipment used: Rolling walker (2 wheeled) Transfers: Sit to/from Stand Sit to Stand: Supervision              Balance Overall balance assessment: Needs assistance Sitting-balance support: No upper extremity supported;Feet supported       Standing balance support: Bilateral upper extremity supported;During functional activity Standing balance-Leahy Scale: Fair                               ADL Overall ADL's : Needs assistance/impaired Eating/Feeding: Independent   Grooming: Wash/dry hands;Wash/dry face;Oral care;Modified independent;Standing   Upper Body Bathing: Supervision/ safety   Lower Body Bathing: Min guard   Upper Body Dressing : Minimal assistance;Sitting Upper Body Dressing Details (indicate cue type and reason): pt requires (A) to tighten brace but able to don brace. OT to call [employer] due to patient c/o brace pressing on R port site     Toilet Transfer: Supervision/safety           Functional mobility during ADLs: Supervision/safety General ADL Comments: Pt demonstrates bed mobility with detail and education on log rolling. pt plans to use step stool at home for transfer. Educated on Fre[employer] forward[employer]      Pertinent Vitals/Pain Pain Assessment: 0-10 Pain Score: 4  Pain Location: Back and neck  Pain Descriptors / Indicators: Aching Pain Intervention(s): Monitored during session;Premedicated before session;Repositioned     Hand Dominance Right   Extremity/Trunk Assessment Upper Extremity Assessment Upper Extremity Assessment: Overall WFL for tasks assessed   Lower Extremity Assessment Lower Extremity Assessment: Defer to PT evaluation   Cervical / Trunk Assessment Cervical / Trunk Assessment: Other exceptions Cervical / Trunk Exceptions: s/p surg   Communication Communication Communication: No difficulties   Cognition Arousal/Alertness: Awake/alert Behavior During Therapy: WFL for tasks assessed/performed Overall Cognitive Status: Within Functional Limits for tasks assessed  General Comments       Exercises       Shoulder Instructions      Home Living Family/patient expects to be discharged to:: Private residence Living Arrangements: Spouse/significant other Available Help at Discharge: Family;Available 24 hours/day Type of Home: House Home Access:  Level entry     Home Layout: One level     Bathroom Shower/Tub: Occupational psychologist: Handicapped height Bathroom Accessibility: Yes How Accessible: Accessible via wheelchair Home Equipment: [employer] [employer] [place] - 2 wheels;Wheelchair - [employer];Shower seat;Crutches;Cane - single point          Prior Functioning/Environment Level of Independence: Needs assistance  Gait / Transfers Assistance Needed: Uses w/c for community ambulation. Pt unable to consistently amb indep, requires crutches, cane, or walker. ADL's / Homemaking Assistance Needed: Requires assist from husband for ADLs, including transfers and bathing.    Comments: previous CIR admission for 21 days        OT Problem List: Decreased strength;Decreased activity tolerance;Impaired balance (sitting and/or standing);Decreased safety awareness;Decreased knowledge of use of DME or AE;Decreased knowledge of precautions;Pain   OT Treatment/Interventions: Self-care/ADL training;Therapeutic exercise;DME and/or AE instruction;Therapeutic activities;Patient/family education;Balance training    OT Goals(Current goals can be found in the care plan section) Acute Rehab OT Goals Patient Stated Goal: Going home OT Goal Formulation: With patient Time For Goal Achievement: 06/29/16 Potential to Achieve Goals: Good  OT Frequency: Min 2X/week   Barriers to D/C:            Co-evaluation              End of Session Equipment Utilized During Treatment: Gait belt;Rolling walker;Back brace Nurse Communication: Mobility status;Precautions  Activity Tolerance: Patient tolerated treatment well Patient left: in bed;with call bell/phone within reach   Time: 0743-0810 OT Time Calculation (min): 27 min Charges:  OT General Charges $OT Visit: 1 Procedure OT Evaluation $OT Eval Moderate Complexity: 1 Procedure OT Treatments $Self Care/Home Management : 8-22 mins G-Codes:    Peri Maris 07-06-16, 8:21  AM  Jeri Modena   OTR/L Pager: [phone] Office: [phone] .

## 2016-06-15 NOTE — Progress Notes (Signed)
Pt discharge home. Discharge instructions were reviewed with pt. PT verbalized understanding

## 2016-06-15 NOTE — Clinical Social Work Note (Signed)
CSW consulted for new SNF. P/T is recommending no f/u. RNCM is following for d/c planning needs. CSW is signing off, as no further needs identified.   Oretha Ellis, MSW, [place] Social Worker  [phone]

## 2016-06-15 NOTE — Care Management Note (Signed)
Case Management Note  Patient Details  Name: Heather [name] MRN: [id number] Date of Birth: Jun 09, 1945  Subjective/Objective:                    Action/Plan: Pt discharging home with [facility]. CM met with the patient and provided her a list of [facility] agencies. She selected [facility]. Santiago Glad with [facility] notified and accepted the referral. No DME needs per PT. Pt states she has transportation home.   Expected Discharge Date:  06/15/16               Expected Discharge Plan:  Home/Self Care  In-House Referral:     Discharge planning Services  CM Consult  Post Acute Care Choice:  Home Health Choice offered to:  Patient  DME Arranged:    DME Agency:     HH Arranged:  PT [place]:  [place]  Status of Service:  Completed, signed off  If discussed at [place] of Stay Meetings, dates discussed:    Additional Comments:  Pollie Friar, RN 06/15/2016, 12:11 PM

## 2016-06-15 NOTE — Consult Note (Signed)
[facility] CM Primary Care Navigator  06/15/2016  Heather [name] 05-09-1946 [id number]  Met with patient and husband Heather [name]) at the bedside to identify possible discharge needs. Patient reports increased back pain which limits her movements had led to this admission/surgery.  Patient endorses Heather [name] with [facility] as the primary care provider.     Patient shared using [facility] in [place] to obtain medications without any problem.   Patient reports that husband is managing her medications at home using "pill box" system.   Husband provides transportation to patient's doctors' appointments.  Patient's husband is her primary caregiver at home.   Discharge plan is home with home health services [facility]) per patient.  Patient voiced understanding to call primary care provider's office when she returns home, for a post discharge follow-up appointment within a week or sooner if needs arise. Patient letter (with PCP's contact number) was provided as a reminder.  Patient communicated no further health management needs or concerns at this time.  For additional questions please contact:  Edwena Felty A. Nevena Rozenberg, BSN, RN-BC [facility] PRIMARY CARE Navigator Cell: [phone]

## 2016-06-15 NOTE — Progress Notes (Signed)
Physical Therapy Treatment Patient Details Name: Heather [name] MRN: [id number] DOB: 08/22/1945 Today's Date: 06/15/2016    History of Present Illness Pt is a 71 y.o. female with history of metastatic breast CA with prior deocmpression and stabilization at T5, instrumentation from T2-T7, HTN, dizziness, metastasis to spinal column and L femur, and is s/p removal of hardware at T6-7, pedicle screw stabilization at T8-10. Imaging demonstrated pullout of the lower screws and progressive kyphosis.      PT Comments    Patient progressing with tolerance to activity this session and demonstrating safe technique with back precautions.  Will have assist from spouse for brace and ADL's.  Feel no follow up PT needs at this time.  Will follow if not d/c home today.   Follow Up Recommendations  No PT follow up;Supervision/Assistance - 24 hour     Equipment Recommendations  None recommended by PT    Recommendations for Other Services       Precautions / Restrictions Precautions Precautions: Back Precaution Comments: mobilizes with precautions safely Required Braces or Orthoses: Spinal Brace Spinal Brace: Thoracolumbosacral orthotic;Applied in sitting position (assist to don brace, able to doff with S)    Mobility  Bed Mobility Overal bed mobility: Modified Independent (use of bed rail to come upright, no rail to supine)                Transfers Overall transfer level: Modified independent Equipment used: Rolling walker (2 wheeled) Transfers: Sit to/from Stand Sit to Stand: Supervision         General transfer comment: assist for safety; practiced simulated car transfer with cues and S  Ambulation/Gait Ambulation/Gait assistance: Supervision;Min guard Ambulation Distance (Feet): 175 Feet Assistive device: Rolling walker (2 wheeled) Gait Pattern/deviations: Step-through pattern;Decreased stride length     General Gait Details: stable with RW and no LOB   Stairs             Wheelchair Mobility    Modified Rankin (Stroke Patients Only)       Balance Overall balance assessment: Needs assistance Sitting-balance support: Feet supported Sitting balance-Leahy Scale: Good     Standing balance support: Bilateral upper extremity supported;During functional activity Standing balance-Leahy Scale: Fair Standing balance comment: relies on RW for ambulation, not as reliant in static positions                    Cognition Arousal/Alertness: Awake/alert Behavior During Therapy: WFL for tasks assessed/performed Overall Cognitive Status: Within Functional Limits for tasks assessed                      Exercises      General Comments General comments (skin integrity, edema, etc.): discussed activity level at d/c (reports wears compression stocking at home, states spouse can assist, has recumbent bike at home and discussed waiting at least until incision healed prior to attempting.)      Pertinent Vitals/Pain Pain Assessment: 0-10 Pain Score: 7  Pain Location: Back and neck and shoulders Pain Descriptors / Indicators: Aching Pain Intervention(s): Limited activity within patient's tolerance;Monitored during session;Repositioned    Home Living Family/patient expects to be discharged to:: Private residence Living Arrangements: Spouse/significant other Available Help at Discharge: Family;Available 24 hours/day Type of Home: House Home Access: Level entry   Home Layout: One level Home Equipment: Walker - 2 wheels;Wheelchair - [employer];Shower seat;Crutches;Cane - single point      Prior Function Level of Independence: Needs assistance  Gait / Transfers Assistance  Needed: Uses w/c for community ambulation. Pt unable to consistently amb indep, requires crutches, cane, or walker. ADL's / Homemaking Assistance Needed: Requires assist from husband for ADLs, including transfers and bathing.  Comments: previous CIR admission for 21  days   PT Goals (current goals can now be found in the care plan section) Acute Rehab PT Goals Patient Stated Goal: Going home Progress towards PT goals: Progressing toward goals    Frequency    Min 5X/week      PT Plan Discharge plan needs to be updated    Co-evaluation             End of Session Equipment Utilized During Treatment: Back brace Activity Tolerance: Patient tolerated treatment well Patient left: in bed;with call bell/phone within reach;with bed alarm set     Time: 6438-3818 PT Time Calculation (min) (ACUTE ONLY): 25 min  Charges:  $Gait Training: 8-22 mins $Therapeutic Activity: 8-22 mins                    G Codes:      Reginia Naas July 15, 2016, 10:18 AM  Magda Kiel, [place] Jul 15, 2016

## 2016-06-15 NOTE — Progress Notes (Signed)
No issues overnight. Pt reports back pain, oral meds seem to be helping. Able to get to bathroom several times last night, ambulating in hallway.  EXAM:  BP (!) 121/48 (BP Location: Left Arm)   Pulse 97   Temp 99.2 F (37.3 C) (Oral)   Resp 20   Ht 5' 3.5" (1.613 m)   Wt 62.6 kg (138 lb 0.1 oz)   SpO2 98%   BMI 24.06 kg/m   Awake, alert, oriented  Speech fluent, appropriate  CN grossly intact  5/5 BUE/BLE   IMPRESSION:  71 y.o. female POD#2 s/p T2-T11 stabilization for hadware failure/kyphosis. Doing well  PLAN: - Will d/c home today - F/U in my office in 2-3 weeks - Brace when up or OOB

## 2016-06-17 ENCOUNTER — Telehealth: Payer: Self-pay | Admitting: *Deleted

## 2016-06-17 NOTE — Telephone Encounter (Signed)
Call from pt's husband requesting her 2/15 treatment be moved to a bed so she may lie down. She had recent back surgery and is requiring a "cumbersome brace" when she is up. They aren't sure she will be able to tolerate sitting in a recliner for her treatment. Request forwarded to infusion room scheduler. Husband requests to be called, he understands we may not be able to accommodate this request each time.

## 2016-06-20 NOTE — Addendum Note (Signed)
Addendum  created 06/20/16 1526 by Annye Asa, MD   Anesthesia Staff edited

## 2016-06-21 ENCOUNTER — Encounter: Payer: Medicare Other | Admitting: Physical Medicine & Rehabilitation

## 2016-06-21 ENCOUNTER — Other Ambulatory Visit: Payer: Medicare Other

## 2016-06-22 NOTE — Assessment & Plan Note (Signed)
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 MRI 02/14/2015: T5 large destructive lesion with pathologic compression fracture and extensive epidural tumor, involvement of T4, excision metastatic carcinoma ER 60%, PR 0%, HER-2 positive ratio 2.71 average copy #7.45 03/27/2015: T4-6 decompression surgery for spinal cord compression PET-CT 04/17/16 Rt para-spinal mass mildy hypermetabolic SUV 5.9, lytic cortical lesion 4.8 cm lesion left prox femur suv 8.8 favor osseus met disease   Treatment summary: 1. Resumed anastrozole 05/21/2015, but it has caused significant hair loss so we switched her to exemestane 25 mg once daily 07/23/2015 2. Herceptin every 3 week started 04/10/2015 3. Bone metastases: Zometa every 3 months. 4. Radiation therapy to the spine (Dr. [name]) 04/13/2015 to 05/19/2015 5. Radiation therapy to left femur 04/28/2016 to 05/12/2016  -------------------------------------------------------------------------------------------------------------------------- Goal of treatment: Palliation Zometa toxicities: bone pain and fatigue. If it causes persistent symptoms, Zometa every 3 months.  Biopsy of the subcutaneous nodule 05/16/16:Metastatic breast cancer ER 80%, PR 0%, HER-2 positive ratio 2.25, Ki-67 50%  S/P revision of back spine instrumentation 06/13/16  Treatment plan: 1.  Faslodex along with Herceptin and Perjeta We discussed other options including Ibrance and Kadcyla

## 2016-06-23 ENCOUNTER — Ambulatory Visit (HOSPITAL_BASED_OUTPATIENT_CLINIC_OR_DEPARTMENT_OTHER): Payer: Medicare Other

## 2016-06-23 ENCOUNTER — Other Ambulatory Visit: Payer: Medicare Other

## 2016-06-23 ENCOUNTER — Ambulatory Visit: Payer: Medicare Other

## 2016-06-23 ENCOUNTER — Ambulatory Visit (HOSPITAL_BASED_OUTPATIENT_CLINIC_OR_DEPARTMENT_OTHER): Payer: Medicare Other | Admitting: Hematology and Oncology

## 2016-06-23 ENCOUNTER — Encounter: Payer: Self-pay | Admitting: Hematology and Oncology

## 2016-06-23 DIAGNOSIS — C7951 Secondary malignant neoplasm of bone: Secondary | ICD-10-CM

## 2016-06-23 DIAGNOSIS — Z5112 Encounter for antineoplastic immunotherapy: Secondary | ICD-10-CM | POA: Diagnosis not present

## 2016-06-23 DIAGNOSIS — Z170 Estrogen receptor positive status [ER+]: Secondary | ICD-10-CM

## 2016-06-23 DIAGNOSIS — C773 Secondary and unspecified malignant neoplasm of axilla and upper limb lymph nodes: Secondary | ICD-10-CM

## 2016-06-23 DIAGNOSIS — Z5111 Encounter for antineoplastic chemotherapy: Secondary | ICD-10-CM | POA: Diagnosis not present

## 2016-06-23 DIAGNOSIS — T7840XA Allergy, unspecified, initial encounter: Secondary | ICD-10-CM | POA: Insufficient documentation

## 2016-06-23 DIAGNOSIS — C50411 Malignant neoplasm of upper-outer quadrant of right female breast: Secondary | ICD-10-CM | POA: Diagnosis not present

## 2016-06-23 DIAGNOSIS — C50911 Malignant neoplasm of unspecified site of right female breast: Secondary | ICD-10-CM

## 2016-06-23 MED ORDER — FULVESTRANT 250 MG/5ML IM SOLN
500.0000 mg | Freq: Once | INTRAMUSCULAR | Status: AC
  Administered 2016-06-23: 500 mg via INTRAMUSCULAR
  Filled 2016-06-23: qty 10

## 2016-06-23 MED ORDER — SODIUM CHLORIDE 0.9 % IJ SOLN
10.0000 mL | INTRAMUSCULAR | Status: DC | PRN
  Filled 2016-06-23: qty 10

## 2016-06-23 MED ORDER — ACETAMINOPHEN 325 MG PO TABS
ORAL_TABLET | ORAL | Status: AC
  Filled 2016-06-23: qty 2

## 2016-06-23 MED ORDER — SODIUM CHLORIDE 0.9 % IV SOLN
Freq: Once | INTRAVENOUS | Status: AC
  Administered 2016-06-23: 11:00:00 via INTRAVENOUS

## 2016-06-23 MED ORDER — SODIUM CHLORIDE 0.9% FLUSH
10.0000 mL | INTRAVENOUS | Status: DC | PRN
  Administered 2016-06-23: 10 mL
  Filled 2016-06-23: qty 10

## 2016-06-23 MED ORDER — HEPARIN SOD (PORK) LOCK FLUSH 100 UNIT/ML IV SOLN
500.0000 [IU] | Freq: Once | INTRAVENOUS | Status: AC | PRN
  Administered 2016-06-23: 500 [IU]
  Filled 2016-06-23: qty 5

## 2016-06-23 MED ORDER — HEPARIN SOD (PORK) LOCK FLUSH 100 UNIT/ML IV SOLN
250.0000 [IU] | Freq: Once | INTRAVENOUS | Status: DC | PRN
  Filled 2016-06-23: qty 5

## 2016-06-23 MED ORDER — ALTEPLASE 2 MG IJ SOLR
2.0000 mg | Freq: Once | INTRAMUSCULAR | Status: DC | PRN
  Filled 2016-06-23: qty 2

## 2016-06-23 MED ORDER — SODIUM CHLORIDE 0.9 % IJ SOLN
3.0000 mL | Freq: Once | INTRAMUSCULAR | Status: DC | PRN
  Filled 2016-06-23: qty 10

## 2016-06-23 MED ORDER — ACETAMINOPHEN 325 MG PO TABS
650.0000 mg | ORAL_TABLET | Freq: Once | ORAL | Status: AC
  Administered 2016-06-23: 650 mg via ORAL

## 2016-06-23 MED ORDER — SODIUM CHLORIDE 0.9 % IV SOLN: Freq: Once | INTRAVENOUS | Status: DC

## 2016-06-23 MED ORDER — TRASTUZUMAB CHEMO 150 MG IV SOLR
6.0000 mg/kg | Freq: Once | INTRAVENOUS | Status: AC
  Administered 2016-06-23: 378 mg via INTRAVENOUS
  Filled 2016-06-23: qty 18

## 2016-06-23 MED ORDER — HEPARIN SOD (PORK) LOCK FLUSH 100 UNIT/ML IV SOLN
500.0000 [IU] | Freq: Once | INTRAVENOUS | Status: DC | PRN
Start: 2016-06-23 — End: 2016-06-23
  Filled 2016-06-23: qty 5

## 2016-06-23 NOTE — Progress Notes (Signed)
Patient Care Team: [name], MD as PCP - General (Family Medicine)  DIAGNOSIS:  Encounter Diagnosis  Name Primary?  . Primary cancer of upper outer quadrant of right female breast ([place])     SUMMARY OF ONCOLOGIC HISTORY:   Primary cancer of upper outer quadrant of right female breast ([place])   03/08/2012 Initial Diagnosis    Right breast invasive ductal carcinoma ER positive PR negative HER-2 positive Ki-67 44%; another breast mass biopsied in the anterior part of the breast which was also positive for malignancy that was HER-2 negative      05/10/2012 Surgery    Right mastectomy and axillary lymph node dissection: Multifocal disease 5 cm, 1.7 cm, 1.6 cm, ER positive PR negative HER-2 positive Ki-67 44%, 3/7 lymph nodes positive      06/14/2012 - 06/12/2013 Chemotherapy    Adjuvant chemotherapy with [place] 6 followed by Herceptin maintenance      10/25/2012 - 12/11/2012 Radiation Therapy    Adjuvant radiation therapy      02/07/2013 -  Anti-estrogen oral therapy    Letrozole 2.5 mg daily      02/14/2015 Imaging    MRI spine: Large destructive T5 lesion with severe pathologic compression fracture and extensive epidural tumor, severe spinal stenosis with moderate cord compression, tumor involvement of T4      03/18/2015 PET scan    Residual enhancing soft tissue adjacent to the spinal cord. No evidence of metastatic disease. Nonspecific uptake the left nipple      03/27/2015 - 03/30/2015 Hospital Admission    T4-6 decompression for spinal cord compression (lower extremity paralysis)      04/10/2015 -  Chemotherapy    Palliative treatment with Herceptin every 3 weeks with letrozole 2.5 mg daily      04/13/2015 - 05/19/2015 Radiation Therapy    Palliative radiation treatment to the spine      09/01/2015 Imaging    CT chest abdomen pelvis: Pathologic fracture with posterior fusion at T5 no other evidence of metastatic disease in the chest abdomen pelvis      12/23/2015 Imaging      CT CAP: new nonspecific 0.6 cm lymph node was stated mediastinum needs follow-up CT, innumerable tiny groundglass pulmonary nodules throughout both lungs unchanged, patchy consolidation from radiation, T5 fracture, no mets      04/15/2016 PET scan    Rt para-spinal mass mildy hypermetabolic SUV 5.9, lytic cortical lesion 4.8 cm lesion left prox femur suv 8.8 favor osseus met disease      04/28/2016 - 05/12/2016 Radiation Therapy    Palliative XRT Left femur      05/24/2016 Procedure    Soft tissue mass biopsy back: Metastatic breast cancer ER 80%, PR 0%, Ki-67 50%, HER-2 positive ratio 2.25      06/09/2016 -  Anti-estrogen oral therapy    Faslodex with Herceptin and Perjeta every 4 weeks      06/13/2016 - 06/15/2016 Hospital Admission    uncomplicated revision of previous thoracic instrumentation.       CHIEF COMPLIANT: Follow-up of recent spine surgery to revise the metal instrumentation the back  INTERVAL HISTORY: Heather [name] is a 71 year old with above-mentioned history metastatic breast cancer who underwent recent surgery to fix the thoracic instrumentation to fix her spine. This was successfully done. She started on Faslodex on 06/09/2016 and today is day 15 of the Faslodex. Our plan is to initiate Herceptin with Perjeta. She has some weakness in the legs and is wearing a vest.  She continues to have staples on the back.  REVIEW OF SYSTEMS:   Constitutional: Denies fevers, chills or abnormal weight loss Eyes: Denies blurriness of vision Ears, nose, mouth, throat, and face: Denies mucositis or sore throat Respiratory: Denies cough, dyspnea or wheezes Cardiovascular: Denies palpitation, chest discomfort Gastrointestinal:  Denies nausea, heartburn or change in bowel habits Skin: Denies abnormal skin rashes Lymphatics: Denies new lymphadenopathy or easy bruising Neurological: Lower extremity weakness Behavioral/Psych: Mood is stable, no new changes  Extremities: Lower extremity  weakness from prior paralysis and after recent surgery she has some difficulty with walking. All other systems were reviewed with the patient and are negative.  I have reviewed the past medical history, past surgical history, social history and family history with the patient and they are unchanged from previous note.  ALLERGIES:  is allergic to other; acyclovir and related; codeine; keflex [cephalexin]; naprosyn [naproxen]; oxycodone; tramadol; and tussin [guaifenesin].  MEDICATIONS:  Current Outpatient Prescriptions  Medication Sig Dispense Refill  . b complex vitamins tablet Take 1 tablet by mouth daily.    . Calcium Carbonate Antacid (TUMS PO) Take 2 tablets by mouth 2 (two) times daily as needed (acid reflux).    . Cholecalciferol (VITAMIN D) 2000 UNITS tablet Take 2,000 Units by mouth daily.    . Coenzyme Q10 (COQ10) 100 MG CAPS Take 300 mg by mouth at bedtime.     . gabapentin (NEURONTIN) 600 MG tablet Take 1 tablet (600 mg total) by mouth as directed. Take 1 tablet in the AM and afternoon and 1 1/2 tabs at bedtime 315 tablet 2  . HYDROcodone-acetaminophen (NORCO/VICODIN) 5-325 MG tablet Take 1-2 tablets by mouth every 4 (four) hours as needed (mild pain). 60 tablet 0  . lidocaine-prilocaine (EMLA) cream Apply to affected area once (Patient taking differently: Apply 1 application topically every 21 ( twenty-one) days. Apply to port prior to infusions) 30 g 3  . OVER THE COUNTER MEDICATION Place 1 drop into both eyes 2 (two) times daily as needed (dry eyes). Over the counter lubricating eye drops    . polyethylene glycol (MIRALAX / GLYCOLAX) packet Take 17 g by mouth 2 (two) times daily. (Patient taking differently: Take 17 g by mouth daily as needed for moderate constipation. ) 100 each 0  . pravastatin (PRAVACHOL) 10 MG tablet Take 10 mg by mouth every Monday, Wednesday, and Friday.     . Probiotic Product (PROBIOTIC DAILY PO) Take 1 capsule by mouth daily.     . prochlorperazine  (COMPAZINE) 10 MG tablet Take 10 mg by mouth every 6 (six) hours as needed for nausea or vomiting.    . ranitidine (ZANTAC) 300 MG tablet Take 300 mg by mouth at bedtime as needed. Reported on 06/25/2015    . spironolactone (ALDACTONE) 25 MG tablet Take 0.5 tablets (12.5 mg total) by mouth daily. 45 tablet 12   No current facility-administered medications for this visit.     PHYSICAL EXAMINATION: ECOG PERFORMANCE STATUS: 2 - Symptomatic, <50% confined to bed  Vitals:   06/23/16 0930  BP: (!) 131/57  Pulse: 78  Resp: 18  Temp: 97.8 F (36.6 C)   Filed Weights   06/23/16 0930  Weight: 140 lb 11.2 oz (63.8 kg)    GENERAL:alert, no distress and comfortable SKIN: skin color, texture, turgor are normal, no rashes or significant lesions EYES: normal, Conjunctiva are pink and non-injected, sclera clear OROPHARYNX:no exudate, no erythema and lips, buccal mucosa, and tongue normal  NECK: supple, thyroid normal size,  non-tender, without nodularity LYMPH:  no palpable lymphadenopathy in the cervical, axillary or inguinal LUNGS: clear to auscultation and percussion with normal breathing effort HEART: regular rate & rhythm and no murmurs and no lower extremity edema ABDOMEN:abdomen soft, non-tender and normal bowel sounds MUSCULOSKELETAL:no cyanosis of digits and no clubbing  NEURO: alert & oriented x 3 with fluent speech, no focal motor/sensory deficits EXTREMITIES: No lower extremity edema  LABORATORY DATA:  I have reviewed the data as listed   Chemistry      Component Value Date/Time   NA 137 06/14/2016 0413   NA 138 06/02/2016 0853   K 4.2 06/14/2016 0413   K 4.3 06/02/2016 0853   CL 103 06/14/2016 0413   CL 101 10/25/2012 0946   CO2 26 06/14/2016 0413   CO2 26 06/02/2016 0853   BUN 14 06/14/2016 0413   BUN 16.1 06/02/2016 0853   CREATININE 0.82 06/14/2016 0413   CREATININE 0.8 06/02/2016 0853      Component Value Date/Time   CALCIUM 8.2 (L) 06/14/2016 0413   CALCIUM 10.3  06/02/2016 0853   ALKPHOS 83 06/02/2016 0853   AST 27 06/02/2016 0853   ALT 24 06/02/2016 0853   BILITOT 0.45 06/02/2016 0853       Lab Results  Component Value Date   WBC 6.4 06/14/2016   HGB 10.4 (L) 06/14/2016   HCT 31.2 (L) 06/14/2016   MCV 85.7 06/14/2016   PLT 117 (L) 06/14/2016   NEUTROABS 3.1 06/02/2016    ASSESSMENT & PLAN:  Primary cancer of upper outer quadrant of right female breast ([place]) Right breast invasive ductal carcinoma ER positive PR negative HER-2 positive Ki-67 44% multifocal disease 3/7 lymph nodes positive T2 N1 M0 stage IIB status post adjuvant chemotherapy with TCH followed by Herceptin maintenance, adjuvant radiation therapy and Letrozole. MRI 02/14/2015: T5 large destructive lesion with pathologic compression fracture and extensive epidural tumor, involvement of T4, excision metastatic carcinoma ER 60%, PR 0%, HER-2 positive ratio 2.71 average copy #7.45 03/27/2015: T4-6 decompression surgery for spinal cord compression PET-CT 04/17/16 Rt para-spinal mass mildy hypermetabolic SUV 5.9, lytic cortical lesion 4.8 cm lesion left prox femur suv 8.8 favor osseus met disease   Treatment summary: 1. Resumed anastrozole 05/21/2015, but it has caused significant hair loss so we switched her to exemestane 25 mg once daily 07/23/2015 2. Herceptin every 3 week started 04/10/2015 3. Bone metastases: Zometa every 3 months. 4. Radiation therapy to the spine (Dr. [name]) 04/13/2015 to 05/19/2015 5. Radiation therapy to left femur 04/28/2016 to 05/12/2016  -------------------------------------------------------------------------------------------------------------------------- Goal of treatment: Palliation Zometa toxicities: bone pain and fatigue. If it causes persistent symptoms, Zometa every 3 months.  Biopsy of the subcutaneous nodule 05/16/16:Metastatic breast cancer ER 80%, PR 0%, HER-2 positive ratio 2.25, Ki-67 50%  S/P revision of back spine instrumentation  06/13/16  Treatment plan: 1.  Faslodex along with Herceptin and Perjeta first dose of Faslodex given 06/09/2016, postponing Perjeta to March because if she has diarrhea, it would be very difficult for her to get to the bathroom because of the recent spine surgery. 2. today she will receive Herceptin alone. Our plan is to obtain a PET/CT scan in 4 months from now.  We discussed other options including Ibrance and Kadcyla  I spent 25 minutes talking to the patient of which more than half was spent in counseling and coordination of care.  No orders of the defined types were placed in this encounter.  The patient has a good understanding of the  overall plan. she agrees with it. she will call with any problems that may develop before the next visit here.   Rulon Eisenmenger, MD 06/23/16

## 2016-06-26 ENCOUNTER — Telehealth: Payer: Self-pay

## 2016-06-26 NOTE — Telephone Encounter (Signed)
SPOKE WITH PATIENT AND SHE IS AWARE OF HER APPTS PER 2/15 LOS,SHE ALSO REQ THAT I R/S HER GENETICS APPTS    [name]

## 2016-06-28 DIAGNOSIS — Z85830 Personal history of malignant neoplasm of bone: Secondary | ICD-10-CM | POA: Diagnosis not present

## 2016-06-28 DIAGNOSIS — Z853 Personal history of malignant neoplasm of breast: Secondary | ICD-10-CM | POA: Diagnosis not present

## 2016-06-28 DIAGNOSIS — F419 Anxiety disorder, unspecified: Secondary | ICD-10-CM | POA: Diagnosis not present

## 2016-06-28 DIAGNOSIS — E785 Hyperlipidemia, unspecified: Secondary | ICD-10-CM | POA: Diagnosis not present

## 2016-06-28 DIAGNOSIS — T84296D Other mechanical complication of internal fixation device of vertebrae, subsequent encounter: Secondary | ICD-10-CM | POA: Diagnosis not present

## 2016-06-28 DIAGNOSIS — D649 Anemia, unspecified: Secondary | ICD-10-CM | POA: Diagnosis not present

## 2016-06-28 NOTE — Progress Notes (Addendum)
71 y.o.woman metastatic breastcancerwith new left femur metastasis radiation completed 05-12-16 FU.  Pain:3/10 Upper back and left femur Skin changes: No signs of infection to the spine wearing a back brace when she is up and about for support. Edema of bilateral lower extremities Bowel /Bladder issues:Has a history of urinary leakage and hesitancy Weight: Wt Readings from Last 3 Encounters:  07/05/16 140 lb (63.5 kg)  06/23/16 140 lb 11.2 oz (63.8 kg)  06/13/16 138 lb 0.1 oz (62.6 kg)  : Appetite:Has returned from havind surgery eating two meals per day. Fatigue:Having fatigue regaining her strength from having surgery 06-10-16. 06-23-16 Saw Dr. Riley Lam for  two week follow up visit post surgery to spine nexy appointment 08-15-16 BP 134/74   Pulse 78   Temp 97.9 F (36.6 C) (Oral)   Resp 16   Ht 5' 3.5" (1.613 m)   Wt 140 lb (63.5 kg)   SpO2 100%   BMI 24.41 kg/m

## 2016-06-30 ENCOUNTER — Ambulatory Visit: Payer: Medicare Other

## 2016-06-30 DIAGNOSIS — I10 Essential (primary) hypertension: Secondary | ICD-10-CM | POA: Diagnosis not present

## 2016-06-30 DIAGNOSIS — T84498A Other mechanical complication of other internal orthopedic devices, implants and grafts, initial encounter: Secondary | ICD-10-CM | POA: Diagnosis not present

## 2016-07-01 DIAGNOSIS — F419 Anxiety disorder, unspecified: Secondary | ICD-10-CM | POA: Diagnosis not present

## 2016-07-01 DIAGNOSIS — E785 Hyperlipidemia, unspecified: Secondary | ICD-10-CM | POA: Diagnosis not present

## 2016-07-01 DIAGNOSIS — Z853 Personal history of malignant neoplasm of breast: Secondary | ICD-10-CM | POA: Diagnosis not present

## 2016-07-01 DIAGNOSIS — D649 Anemia, unspecified: Secondary | ICD-10-CM | POA: Diagnosis not present

## 2016-07-01 DIAGNOSIS — T84296D Other mechanical complication of internal fixation device of vertebrae, subsequent encounter: Secondary | ICD-10-CM | POA: Diagnosis not present

## 2016-07-01 DIAGNOSIS — Z85830 Personal history of malignant neoplasm of bone: Secondary | ICD-10-CM | POA: Diagnosis not present

## 2016-07-04 ENCOUNTER — Other Ambulatory Visit: Payer: Medicare Other

## 2016-07-04 ENCOUNTER — Ambulatory Visit (HOSPITAL_BASED_OUTPATIENT_CLINIC_OR_DEPARTMENT_OTHER): Payer: Medicare Other | Admitting: Genetic Counselor

## 2016-07-04 ENCOUNTER — Encounter: Payer: Self-pay | Admitting: Genetic Counselor

## 2016-07-04 DIAGNOSIS — Z806 Family history of leukemia: Secondary | ICD-10-CM

## 2016-07-04 DIAGNOSIS — C50411 Malignant neoplasm of upper-outer quadrant of right female breast: Secondary | ICD-10-CM | POA: Diagnosis not present

## 2016-07-04 DIAGNOSIS — Z170 Estrogen receptor positive status [ER+]: Secondary | ICD-10-CM

## 2016-07-04 DIAGNOSIS — Z315 Encounter for genetic counseling: Secondary | ICD-10-CM

## 2016-07-04 DIAGNOSIS — Z800 Family history of malignant neoplasm of digestive organs: Secondary | ICD-10-CM

## 2016-07-04 NOTE — Progress Notes (Signed)
Heather [name]   Patient Name: Heather [name] Patient DOB: 04/30/1946 Encounter Date: 07/04/2016  Referring Provider: [name], MD  Primary Care Provider: Vidal Schwalbe, MD  Reason for Visit: Evaluate for hereditary susceptibility to cancer   [name], a 71 y.o. female, is being seen at the [name] due to a personal and family history of cancer. She presents to [name] today to discuss the possibility of a hereditary predisposition to cancer and discuss whether genetic testing is warranted.  History of Present Illness: [name] was diagnosed with breast cancer at the age of 11.  She had a right mastectomy, chemotherapy and radiation as well as AI. Unfortunately, she is now being treated for metastatic disease.    Primary cancer of upper outer quadrant of right female breast ([place])   03/08/2012 Initial Diagnosis    Right breast invasive ductal carcinoma ER positive PR negative HER-2 positive Ki-67 44%; another breast mass biopsied in the anterior part of the breast which was also positive for malignancy that was HER-2 negative      05/10/2012 Surgery    Right mastectomy and axillary lymph node dissection: Multifocal disease 5 cm, 1.7 cm, 1.6 cm, ER positive PR negative HER-2 positive Ki-67 44%, 3/7 lymph nodes positive      06/14/2012 - 06/12/2013 Chemotherapy    Adjuvant chemotherapy with [place] 6 followed by Herceptin maintenance      10/25/2012 - 12/11/2012 Radiation Therapy    Adjuvant radiation therapy      02/07/2013 -  Anti-estrogen oral therapy    Letrozole 2.5 mg daily      02/14/2015 Imaging    MRI spine: Large destructive T5 lesion with severe pathologic compression fracture and extensive epidural tumor, severe spinal stenosis with moderate cord compression, tumor involvement of T4      03/18/2015 PET scan    Residual enhancing soft tissue adjacent to the spinal cord. No evidence of metastatic  disease. Nonspecific uptake the left nipple      03/27/2015 - 03/30/2015 Hospital Admission    T4-6 decompression for spinal cord compression (lower extremity paralysis)      04/10/2015 -  Chemotherapy    Palliative treatment with Herceptin every 3 weeks with letrozole 2.5 mg daily      04/13/2015 - 05/19/2015 Radiation Therapy    Palliative radiation treatment to the spine      09/01/2015 Imaging    CT chest abdomen pelvis: Pathologic fracture with posterior fusion at T5 no other evidence of metastatic disease in the chest abdomen pelvis      12/23/2015 Imaging    CT CAP: new nonspecific 0.6 cm lymph node was stated mediastinum needs follow-up CT, innumerable tiny groundglass pulmonary nodules throughout both lungs unchanged, patchy consolidation from radiation, T5 fracture, no mets      04/15/2016 PET scan    Rt para-spinal mass mildy hypermetabolic SUV 5.9, lytic cortical lesion 4.8 cm lesion left prox femur suv 8.8 favor osseus met disease      04/28/2016 - 05/12/2016 Radiation Therapy    Palliative XRT Left femur      05/24/2016 Procedure    Soft tissue mass biopsy back: Metastatic breast cancer ER 80%, PR 0%, Ki-67 50%, HER-2 positive ratio 2.25      06/09/2016 -  Anti-estrogen oral therapy    Faslodex with Herceptin and Perjeta every 4 weeks      06/13/2016 - 06/15/2016 Hospital  Admission    uncomplicated revision of previous thoracic instrumentation.        Past Medical History:  Diagnosis Date  . Anemia    hx of  . Anxiety    r/t updated surgery  . Breast cancer ([name])    right  . Cataract    immature-not sure of which eye  . Complication of anesthesia    pt states she is sensitive to meds  . Dizziness    has been going on for 15month;medical MD aware  . GERD (gastroesophageal reflux disease)    Tums prn  . H. pylori infection   . H/O hiatal hernia   . Hemorrhoids   . History of UTI   . Hx of radiation therapy 10/25/12- 12/11/12   right chest wall/regional  lymph nodes 5040 cGy, 28 sessions, right chest wall boost 1000 cGy 1 session  . Hx: UTI (urinary tract infection)   . Hyperlipidemia    but not on meds;diet and exercise controlled  . Hypertension    recently started Aldactone   . Insomnia    takes Melatoniin daily  . Metastasis to spinal column ([name])    T5, Left  Femur cancer- radiation   . [place] ([place]    "from back surgery"  . PONV (postoperative nausea and vomiting)    pt experienced hair loss, confusion and combative- 02/15/15  . Right knee pain   . Right shoulder pain   . Skin cancer    squamous, Nodule to back - "same cancer as breast."  . Urinary frequency    d/t taking Aldactone    Past Surgical History:  Procedure Laterality Date  . COLONOSCOPY    . cyst removed from left breast  1970  . DECOMPRESSIVE LUMBAR LAMINECTOMY LEVEL 4 N/A 02/15/2015   Procedure: DECOMPRESSION T5 AND T3-T7 STABALIZATION;  Surgeon: NConsuella Lose MD;  [facility];  Service: Neurosurgery;  Laterality: N/A;  . ESOPHAGOGASTRODUODENOSCOPY    . LAMINECTOMY N/A 03/27/2015   Procedure: Thoracic Four-Thoracic Six Laminectomy for tumor;  Surgeon: NConsuella Lose MD;  [facility];  Service: Neurosurgery;  Laterality: N/A;  T4-T6 Laminectomy  . left wrist surgery   2004   with plate  . MASTECTOMY MODIFIED RADICAL  05/10/2012   Procedure: MASTECTOMY MODIFIED RADICAL;  Surgeon: PMerrie Roof MD;  [facility]  Service: General;  Laterality: Right;  RIGHT MODIFIED RADICAL MASTECTOMY  . MASTECTOMY, RADICAL Right   . MINOR BREAST BIOPSY Left 05/16/2016   Procedure: EXCISION OF BACK NODULE;  Surgeon: PAutumn MessingIII, MD;  [facility]  Service: General;  Laterality: Left;  . Nodule  back  05/17/2015  . PORTACATH PLACEMENT  05/10/2012   Procedure: INSERTION PORT-A-CATH;  Surgeon: PMerrie Roof MD;  [facility];  Service: General;  Laterality: Left;    Social History   Social History  . Marital  status: Married    Spouse name: N/A  . Number of children: 2  . Years of education: N/A   Social History Main Topics  . Smoking status: Never Smoker  . Smokeless tobacco: Never Used  . Alcohol use No  . Drug use: No  . Sexual activity: Yes    Birth control/ protection: Post-menopausal   Other Topics Concern  . Not on file   Social History Narrative  . No narrative on file     Family History:  During the visit, a 4-generation pedigree was obtained. Family tree will  be scanned in the Media tab in Epic  Significant diagnoses include the following:  Family History  Problem Relation Age of Onset  . Leukemia Mother 57    deceased 53  . Stroke Father   . Diabetes Mellitus II Father   . Prostate cancer Brother 39    currently 75  . Stomach cancer Maternal Grandmother 71    deceased 54  . Prostate cancer Brother 63    currently 35    Additionally, [name] has two sons (ages 55 and 40). Her younger son has two daughters. She has no sisters and a total of 5 brothers. Her mother had 2 brothers and a sister. Her father (deceased at 56) had 2 sisters and 2 brothers.  [name] ancestry is [place]. There is no known Jewish ancestry and no consanguinity.  Assessment and Plan: [name] is a 71 y.o. female with a personal history of breast cancer, initially diagnosed at age 51 and a family history of cancer as noted above. This history is not suggestive of a hereditary predisposition to cancer. Genetic testing is available to determine whether she has a pathogenic mutation, but she understood that she does not have a high likelihood of harboring a mutation. We reviewed the characteristics, features and inheritance patterns of hereditary cancer syndromes. We discussed the process of genetic testing, including insurance coverage and implications of results: positive, negative and Variant of Uncertain Significance. A negative result will be reassuring.   [name] wished to pursue genetic  testing and a blood sample will be sent for analysis of the 43 genes on [facility]'s Common Cancers panel (APC, ATM, AXIN2, BARD1, BMPR1A, BRCA1, BRCA2, BRIP1, CDH1, CDKN2A, CHEK2, DICER1, EPCAM, GREM1, HOXB13, KIT, MEN1, MLH1, MSH2, MSH6, MUTYH, NBN, NF1, PALB2, PDGFRA, PMS2, POLD1, POLE, PTEN, RAD50, RAD51C, RAD51D, SDHA, SDHB, SDHC, SDHD, SMAD4, SMARCA4, STK11, TP53, TSC1, TSC2, VHL). Results should be available in approximately 2-4 weeks, at which point we will contact her and address implications for her as well as address genetic testing for at-risk family members, if needed.    [name] is encouraged to remain in contact with Cancer Genetics annually so that we can update the family history and inform her of any changes in cancer genetics and testing that may be of benefit for this family. Ms.  Heather [name] questions were answered to her satisfaction today and she is welcome to call with any additional questions or concerns. Thank you for the referral and allowing [place] to share in the care of your patient.   [name] was available for questions concerning this case. Total time spent by Steele Berg, MS, CGC in face-to-face counseling was approximately 30 minutes.   Steele Berg, MS, [place] Certified Genetic Counselor phone: [phone] [email or web address]   ______________________________________________________________________ For Office Staff:  Number of people involved in session: 1 Was an Intern/ student involved with case: no

## 2016-07-05 ENCOUNTER — Encounter: Payer: Self-pay | Admitting: Radiation Oncology

## 2016-07-05 ENCOUNTER — Ambulatory Visit
Admission: RE | Admit: 2016-07-05 | Discharge: 2016-07-05 | Disposition: A | Discharge status: Home / Self Care | Payer: Medicare Other | Source: Ambulatory Visit | Attending: Radiation Oncology | Admitting: Radiation Oncology

## 2016-07-05 VITALS — BP 134/74 | HR 78 | Temp 97.9°F | Resp 16 | Ht 63.5 in | Wt 140.0 lb

## 2016-07-05 DIAGNOSIS — Z881 Allergy status to other antibiotic agents status: Secondary | ICD-10-CM | POA: Insufficient documentation

## 2016-07-05 DIAGNOSIS — Z888 Allergy status to other drugs, medicaments and biological substances status: Secondary | ICD-10-CM | POA: Diagnosis not present

## 2016-07-05 DIAGNOSIS — M792 Neuralgia and neuritis, unspecified: Secondary | ICD-10-CM | POA: Insufficient documentation

## 2016-07-05 DIAGNOSIS — C7951 Secondary malignant neoplasm of bone: Secondary | ICD-10-CM | POA: Diagnosis not present

## 2016-07-05 DIAGNOSIS — Z9889 Other specified postprocedural states: Secondary | ICD-10-CM | POA: Diagnosis not present

## 2016-07-05 DIAGNOSIS — Z885 Allergy status to narcotic agent status: Secondary | ICD-10-CM | POA: Diagnosis not present

## 2016-07-05 DIAGNOSIS — M546 Pain in thoracic spine: Secondary | ICD-10-CM | POA: Insufficient documentation

## 2016-07-05 DIAGNOSIS — C50919 Malignant neoplasm of unspecified site of unspecified female breast: Secondary | ICD-10-CM | POA: Insufficient documentation

## 2016-07-05 NOTE — Progress Notes (Signed)
Radiation Oncology         [phone] ________________________________  Name: Heather [name] MRN: [id number]  Date: 07/05/2016  DOB: 06-01-1945  Post Treatment Note  CC: Vidal Schwalbe, MD  [name], MD  Diagnosis:  Metastatic breast cancer to bone  Interval Since Last Radiation: 7  weeks   04/27/16-05/12/16:  Left femur/ 30 Gy in 10 fractions  04/13/2015-05/19/2015: 55 Gy in 25 fractions of 2.2 Gy to the thoracic spine  10/25/12-12/11/12: 50.4 Gy to the right chest wall and regional lymph nodes in 28 fractions with a 10 Gy boost to the right chest wall in 1 session.  Narrative:  The patient returns today for routine follow-up. Shortness is a well known patient to our service with a history of metastatic breast cancer originally treated in 2014 to the chest wall and regional lymph nodes. She developed recurrent disease within the thoracic spine and subsequently has undergone 3 surgeries, in addition to radiotherapy, with her most recent surgery being   on 06/13/2016 where she had removal of thoracic hardware and replacement of pedicle screws with stabilization. She has done well in the spine however from a disease perspective, and most recently completed a course of radiotherapy in the palliative approach to the left femur due to pain control. As a result of her more newly noted disease, she is planning on starting particular with [name].                           On review of systems, the patient states she is doing pretty well overall. She continues to have some pain in her left femur but reports that this is improved since her radiotherapy. She states that she does have a sharp shooting pain that comes from her left hip and into her groin on the left. She denies any lower extremity edema, and she has been quite motivated to work with physical therapy and continues to make advances in her ability to ambulate. She is no longer using a walker. She states that she is not experiencing  any shortness of breath or chest pain. She does have questions as to whe we will resume her surveillance scans from her back. No other complaints or verbalized.  ALLERGIES:  is allergic to other; acyclovir and related; codeine; keflex [cephalexin]; naprosyn [naproxen]; oxycodone; tramadol; and tussin [guaifenesin].  Meds: Current Outpatient Prescriptions  Medication Sig Dispense Refill  . gabapentin (NEURONTIN) 600 MG tablet Take 1 tablet (600 mg total) by mouth as directed. Take 1 tablet in the AM and afternoon and 1 1/2 tabs at bedtime 315 tablet 2  . lidocaine-prilocaine (EMLA) cream Apply to affected area once (Patient taking differently: Apply 1 application topically every 21 ( twenty-one) days. Apply to port prior to infusions) 30 g 3  . OVER THE COUNTER MEDICATION Place 1 drop into both eyes 2 (two) times daily as needed (dry eyes). Over the counter lubricating eye drops    . polyethylene glycol (MIRALAX / GLYCOLAX) packet Take 17 g by mouth 2 (two) times daily. (Patient taking differently: Take 17 g by mouth daily as needed for moderate constipation. ) 100 each 0  . pravastatin (PRAVACHOL) 10 MG tablet Take 10 mg by mouth every Monday, Wednesday, and Friday.     . ranitidine (ZANTAC) 300 MG tablet Take 300 mg by mouth at bedtime as needed. Reported on 06/25/2015    . b complex vitamins tablet Take 1 tablet by mouth  daily.    . Calcium Carbonate Antacid (TUMS PO) Take 2 tablets by mouth 2 (two) times daily as needed (acid reflux).    . Cholecalciferol (VITAMIN D) 2000 UNITS tablet Take 2,000 Units by mouth daily.    . Coenzyme Q10 (COQ10) 100 MG CAPS Take 300 mg by mouth at bedtime.     Marland Kitchen HYDROcodone-acetaminophen (NORCO/VICODIN) 5-325 MG tablet Take 1-2 tablets by mouth every 4 (four) hours as needed (mild pain). (Patient not taking: Reported on 07/05/2016) 60 tablet 0  . Probiotic Product (PROBIOTIC DAILY PO) Take 1 capsule by mouth daily.     . prochlorperazine (COMPAZINE) 10 MG tablet  Take 10 mg by mouth every 6 (six) hours as needed for nausea or vomiting.    Marland Kitchen spironolactone (ALDACTONE) 25 MG tablet Take 0.5 tablets (12.5 mg total) by mouth daily. (Patient not taking: Reported on 07/05/2016) 45 tablet 12   No current facility-administered medications for this encounter.     Physical Findings:  height is 5' 3.5" (1.613 m) and weight is 140 lb (63.5 kg). Her oral temperature is 97.9 F (36.6 C). Her blood pressure is 134/74 and her pulse is 78. Her respiration is 16 and oxygen saturation is 100%.  Pain Assessment Pain Score: 3  (upper back,left femur)/10 In general this is a well appearing Caucasian female no acute distres. She alert and oriented x4 and appropriate throughout the examination. Cardiopulmonary assessment is negative for acute distress and she exhibits normal effort.  She is wearing a back brace. She ambulates without difficulty, and no gait disturbances are noted. She does continue to have neuropathic pain per report in her extremities bilaterally and states that this had initially worsened since surgery, and she continues to take gabapentin 600 mg 2-3 times per day.    Lab Findings: Lab Results  Component Value Date   WBC 6.4 06/14/2016   HGB 10.4 (L) 06/14/2016   HCT 31.2 (L) 06/14/2016   MCV 85.7 06/14/2016   PLT 117 (L) 06/14/2016     Radiographic Findings: Dg Thoracic Spine 2 View  Result Date: 06/13/2016 CLINICAL DATA:  T8-T10 stabilization. Previous fusion from T3 through T7 secondary to metastasis at T5. EXAM: THORACIC SPINE 2 VIEWS; DG C-ARM 61-120 MIN COMPARISON:  None. FINDINGS: A total of 1 minutes 20 seconds of fluoroscopic time was utilized with placement of bilateral fixation rods spanning the upper through lower thoracic spine the T 11 level. Bilateral pedicle screws from T8 through T11 and of the visualized upper thoracic spine from T3 through T5. Left-sided port catheter is noted. Endotracheal and gastric tubes are also place. IMPRESSION:  Spinal fixation rods spanning at least T3 through T11 bilaterally with new bilateral pedicle screws from T8 through T11. Interval removal of T6 and T7 screws. Electronically Signed   By: Ashley Royalty M.D.   On: 06/13/2016 20:24   Ct Cervical Spine Wo Contrast  Result Date: 06/13/2016 CLINICAL DATA:  Loosening of hardware. Previous fusion from T3-T7 secondary to metastasis at T5. EXAM: CT CERVICAL SPINE WITHOUT CONTRAST CT THORACIC SPINE WITHOUT CONTRAST TECHNIQUE: Multidetector CT imaging of the cervical and thoracic spine was performed without contrast. Multiplanar CT image reconstructions were also generated. COMPARISON:  CT scans dated 04/07/2016 and 03/30/2015 FINDINGS: CT CERVICAL SPINE FINDINGS Alignment: Normal. Skull base and vertebrae: No acute fracture. No primary bone lesion or focal pathologic process. Degenerative facet arthritis at C7-T1 bilaterally. Soft tissues and spinal canal: No prevertebral fluid or swelling. No visible canal hematoma. Disc levels: Narrowing  of the C5-6 disc space. No disc protrusion or foraminal stenosis. Other: None CT THORACIC SPINE FINDINGS Alignment: 6 mm spondylolisthesis of T4 with respect of T6. The majority of the T5 vertebral body is disintegrated. Vertebrae: There solid posterior fusion at T4-5 on the left. Posterior decompression centrally and to the right at T4-5 and T5-6. There is loosening of the pedicle screws at T6 and T7 bilaterally, essentially unchanged since the prior study. Alignment of the distal thoracic spine is normal. There is Paraspinal and other soft tissues: There is increased soft tissue nodularity and soft tissue thickening posteriorly at the T8 to T11 level on the right. Chronic linear scarring at the left lung base posteriorly. No new bone lesions. IMPRESSION: No significant change in the appearance of the thoracic spine since the prior CT scan of 04/07/2016. Chronic loosening of the pedicle screws at T6 and T7. No significant abnormality of  the cervical spine. Electronically Signed   By: Lorriane Shire M.D.   On: 06/13/2016 14:09   Ct Thoracic Spine Wo Contrast  Result Date: 06/13/2016 CLINICAL DATA:  Loosening of hardware. Previous fusion from T3-T7 secondary to metastasis at T5. EXAM: CT CERVICAL SPINE WITHOUT CONTRAST CT THORACIC SPINE WITHOUT CONTRAST TECHNIQUE: Multidetector CT imaging of the cervical and thoracic spine was performed without contrast. Multiplanar CT image reconstructions were also generated. COMPARISON:  CT scans dated 04/07/2016 and 03/30/2015 FINDINGS: CT CERVICAL SPINE FINDINGS Alignment: Normal. Skull base and vertebrae: No acute fracture. No primary bone lesion or focal pathologic process. Degenerative facet arthritis at C7-T1 bilaterally. Soft tissues and spinal canal: No prevertebral fluid or swelling. No visible canal hematoma. Disc levels: Narrowing of the C5-6 disc space. No disc protrusion or foraminal stenosis. Other: None CT THORACIC SPINE FINDINGS Alignment: 6 mm spondylolisthesis of T4 with respect of T6. The majority of the T5 vertebral body is disintegrated. Vertebrae: There solid posterior fusion at T4-5 on the left. Posterior decompression centrally and to the right at T4-5 and T5-6. There is loosening of the pedicle screws at T6 and T7 bilaterally, essentially unchanged since the prior study. Alignment of the distal thoracic spine is normal. There is Paraspinal and other soft tissues: There is increased soft tissue nodularity and soft tissue thickening posteriorly at the T8 to T11 level on the right. Chronic linear scarring at the left lung base posteriorly. No new bone lesions. IMPRESSION: No significant change in the appearance of the thoracic spine since the prior CT scan of 04/07/2016. Chronic loosening of the pedicle screws at T6 and T7. No significant abnormality of the cervical spine. Electronically Signed   By: Lorriane Shire M.D.   On: 06/13/2016 14:09   Dg C-arm 61-120 Min  Result Date:  06/13/2016 CLINICAL DATA:  T8-T10 stabilization. Previous fusion from T3 through T7 secondary to metastasis at T5. EXAM: THORACIC SPINE 2 VIEWS; DG C-ARM 61-120 MIN COMPARISON:  None. FINDINGS: A total of 1 minutes 20 seconds of fluoroscopic time was utilized with placement of bilateral fixation rods spanning the upper through lower thoracic spine the T 11 level. Bilateral pedicle screws from T8 through T11 and of the visualized upper thoracic spine from T3 through T5. Left-sided port catheter is noted. Endotracheal and gastric tubes are also place. IMPRESSION: Spinal fixation rods spanning at least T3 through T11 bilaterally with new bilateral pedicle screws from T8 through T11. Interval removal of T6 and T7 screws. Electronically Signed   By: Ashley Royalty M.D.   On: 06/13/2016 20:24    Impression/Plan:  1.  Metastatic breast cancer to the bones. The patient appears to be doing well as her radiotherapy in terms of pain, however she's also undergone re-stabilization with exchange of her hardware in the thoracic spine. I will discuss her case tomorrow at multidisciplinary brain and spine conference to review recommendations for resuming her surveillance MRI scans. It has been over a year since her entire spine was surveyed, we would plan to work with a full spine surveyed with MRI that group is in agreement. The patient will be contacted by myself or brain navigator to indicate the recommendations. 2. Neuropathic pain and thoracic pain. The patient has been counseled on the role for meeting with Dr. Maryjean Ka in neurosurgery to see if there are any interventions that could alleviate her pain long-term. She will await evaluation and I will discuss this again tomorrow to see if we can give her any sooner.     Carola Rhine, PAC

## 2016-07-06 DIAGNOSIS — F419 Anxiety disorder, unspecified: Secondary | ICD-10-CM | POA: Diagnosis not present

## 2016-07-06 DIAGNOSIS — D649 Anemia, unspecified: Secondary | ICD-10-CM | POA: Diagnosis not present

## 2016-07-06 DIAGNOSIS — T84296D Other mechanical complication of internal fixation device of vertebrae, subsequent encounter: Secondary | ICD-10-CM | POA: Diagnosis not present

## 2016-07-06 DIAGNOSIS — E785 Hyperlipidemia, unspecified: Secondary | ICD-10-CM | POA: Diagnosis not present

## 2016-07-06 DIAGNOSIS — Z85830 Personal history of malignant neoplasm of bone: Secondary | ICD-10-CM | POA: Diagnosis not present

## 2016-07-06 DIAGNOSIS — Z853 Personal history of malignant neoplasm of breast: Secondary | ICD-10-CM | POA: Diagnosis not present

## 2016-07-07 ENCOUNTER — Telehealth: Payer: Self-pay | Admitting: Radiation Therapy

## 2016-07-07 ENCOUNTER — Other Ambulatory Visit: Payer: Self-pay | Admitting: Radiation Therapy

## 2016-07-07 DIAGNOSIS — C7931 Secondary malignant neoplasm of brain: Secondary | ICD-10-CM

## 2016-07-07 DIAGNOSIS — C7949 Secondary malignant neoplasm of other parts of nervous system: Secondary | ICD-10-CM

## 2016-07-07 NOTE — Telephone Encounter (Signed)
Heather [name] is scheduled to have her total spinal axis scanned on 3/7 and to see Dr. Maryjean Ka for pain management on 3/20. We will discuss her imaging during the 3/12 Brain and Spine Conference.   I spoke with the patient about these appointments and let her know that neurosurgery saw no need to postpone the imaging due to her recent surgery. Heather [name] is happy with this plan.   Mont Dutton R.T.(R)(T) Special Procedures Navigator

## 2016-07-11 ENCOUNTER — Ambulatory Visit: Payer: Self-pay | Admitting: Genetic Counselor

## 2016-07-11 ENCOUNTER — Encounter: Payer: Self-pay | Admitting: Genetic Counselor

## 2016-07-11 DIAGNOSIS — Z1379 Encounter for other screening for genetic and chromosomal anomalies: Secondary | ICD-10-CM

## 2016-07-11 HISTORY — DX: Encounter for other screening for genetic and chromosomal anomalies: Z13.79

## 2016-07-11 NOTE — Progress Notes (Signed)
[facility]    Patient Name: Heather [name] Patient DOB: 1946/01/07 Patient Age: 71 y.o. Encounter Date: 07/11/2016  Referring Provider: [name], MD  Primary Care Provider: Vidal Schwalbe, MD   Ms. Wigle was called today to discuss genetic test results. Please see the Genetics note from her visit on 07/04/16 for a detailed discussion of her personal and family history.  Genetic Testing: At the time of Ms. Hennick's visit, we recommended she pursue genetic testing of multiple associated with a hereditary predisposition to cancer. Testing included sequencing and deletion/duplication analysis. Testing was normal and did not reveal a mutation in these genes. A copy of the genetic test report will be scanned into Epic under the media tab.  The genes tested were the 43 genes on [facility]'s Common Cancers panel (APC, ATM, AXIN2, BARD1, BMPR1A, BRCA1, BRCA2, BRIP1, CDH1, CDKN2A, CHEK2, DICER1, EPCAM, GREM1, HOXB13, KIT, MEN1, MLH1, MSH2, MSH6, MUTYH, NBN, NF1, PALB2, PDGFRA, PMS2, POLD1, POLE, PTEN, RAD50, RAD51C, RAD51D, SDHA, SDHB, SDHC, SDHD, SMAD4, SMARCA4, STK11, TP53, TSC1, TSC2, VHL).  Since the current test is not perfect, it is possible there may be a gene mutation that current testing cannot detect, but that chance is small. We also discussed that it is possible that a different genetic factor, which was not part of this testing or has not yet been discovered, is responsible for the cancer diagnoses in the family. Again, the likelihood of this is low. No additional testing is recommended at this time.   Cancer Screening: This result suggests that Ms. Ishii's cancer was most likely not due to an inherited predisposition. Most cancers happen by chance and this negative test, along with details of her family history, suggests that her cancer falls into this category. We, therefore, recommended she continue to follow the cancer screening guidelines  provided by her physician.   Family Members: Family members are at some increased risk of developing cancer, over the general population risk, simply due to the family history. We recommended women have a yearly mammogram beginning at age 72, a yearly clinical breast exam, and perform monthly breast self-exams. A gynecologic exam is recommended yearly. Colon cancer screening is recommended to begin by age 66 for men and women.  Any relative who had cancer at a young age or had a particularly rare cancer may also wish to pursue genetic testing. Genetic counselors can be located in other cities, by visiting the website of the [employer] ([employer]ist[employer]se) and Field seismologist for a Dietitian by zip code.  Lastly, cancer genetics is a rapidly advancing field and it is possible that new genetic tests will be appropriate for her in the future. We encourage her to remain in contact with [place] on an annual basis so we can update her personal and family histories, and let her know of advances in cancer genetics that may benefit the family. Our contact number was provided. Ms. Clouse is welcome to call anytime with additional questions.    Steele Berg, MS, [place] Certified Genetic Counselor phone: [phone] [email or web address]

## 2016-07-13 ENCOUNTER — Encounter (HOSPITAL_COMMUNITY): Payer: Self-pay

## 2016-07-13 ENCOUNTER — Ambulatory Visit (HOSPITAL_COMMUNITY)
Admission: RE | Admit: 2016-07-13 | Discharge: 2016-07-13 | Disposition: A | Discharge status: Home / Self Care | Payer: Medicare Other | Source: Ambulatory Visit | Attending: Radiation Oncology | Admitting: Radiation Oncology

## 2016-07-13 DIAGNOSIS — C7981 Secondary malignant neoplasm of breast: Secondary | ICD-10-CM | POA: Diagnosis not present

## 2016-07-13 DIAGNOSIS — J910 Malignant pleural effusion: Secondary | ICD-10-CM | POA: Diagnosis not present

## 2016-07-13 DIAGNOSIS — Z853 Personal history of malignant neoplasm of breast: Secondary | ICD-10-CM | POA: Diagnosis not present

## 2016-07-13 DIAGNOSIS — M47896 Other spondylosis, lumbar region: Secondary | ICD-10-CM | POA: Insufficient documentation

## 2016-07-13 DIAGNOSIS — C7949 Secondary malignant neoplasm of other parts of nervous system: Secondary | ICD-10-CM | POA: Diagnosis not present

## 2016-07-13 DIAGNOSIS — M4804 Spinal stenosis, thoracic region: Secondary | ICD-10-CM | POA: Diagnosis not present

## 2016-07-13 DIAGNOSIS — M47892 Other spondylosis, cervical region: Secondary | ICD-10-CM | POA: Diagnosis not present

## 2016-07-13 DIAGNOSIS — C782 Secondary malignant neoplasm of pleura: Secondary | ICD-10-CM | POA: Diagnosis not present

## 2016-07-13 DIAGNOSIS — C7931 Secondary malignant neoplasm of brain: Secondary | ICD-10-CM | POA: Diagnosis not present

## 2016-07-13 DIAGNOSIS — Z981 Arthrodesis status: Secondary | ICD-10-CM | POA: Insufficient documentation

## 2016-07-13 DIAGNOSIS — M5126 Other intervertebral disc displacement, lumbar region: Secondary | ICD-10-CM | POA: Diagnosis not present

## 2016-07-13 MED ORDER — GADOBENATE DIMEGLUMINE 529 MG/ML IV SOLN
15.0000 mL | Freq: Once | INTRAVENOUS | Status: AC | PRN
  Administered 2016-07-13: 13 mL via INTRAVENOUS

## 2016-07-14 ENCOUNTER — Other Ambulatory Visit: Payer: Self-pay | Admitting: Radiation Therapy

## 2016-07-14 DIAGNOSIS — C7801 Secondary malignant neoplasm of right lung: Secondary | ICD-10-CM

## 2016-07-15 ENCOUNTER — Telehealth: Payer: Self-pay | Admitting: Radiation Therapy

## 2016-07-15 DIAGNOSIS — F419 Anxiety disorder, unspecified: Secondary | ICD-10-CM | POA: Diagnosis not present

## 2016-07-15 DIAGNOSIS — E785 Hyperlipidemia, unspecified: Secondary | ICD-10-CM | POA: Diagnosis not present

## 2016-07-15 DIAGNOSIS — Z85830 Personal history of malignant neoplasm of bone: Secondary | ICD-10-CM | POA: Diagnosis not present

## 2016-07-15 DIAGNOSIS — D649 Anemia, unspecified: Secondary | ICD-10-CM | POA: Diagnosis not present

## 2016-07-15 DIAGNOSIS — T84296D Other mechanical complication of internal fixation device of vertebrae, subsequent encounter: Secondary | ICD-10-CM | POA: Diagnosis not present

## 2016-07-15 DIAGNOSIS — Z853 Personal history of malignant neoplasm of breast: Secondary | ICD-10-CM | POA: Diagnosis not present

## 2016-07-15 NOTE — Telephone Encounter (Signed)
Called to let Reve know that her recent spine MRI scans look good, but there is an area on her Rt chest wall that we need to get a better picture of. She has a CT chest with contrast scheduled for Tuesday 3/13 at [facility].   Mont Dutton R.T.(R)(T) Special Procedures Navigator

## 2016-07-18 ENCOUNTER — Telehealth: Payer: Self-pay | Admitting: Radiation Oncology

## 2016-07-18 ENCOUNTER — Telehealth: Payer: Self-pay

## 2016-07-18 NOTE — Telephone Encounter (Signed)
I called and spoke to the patient about our review of her MRI results and the discussion regarding her pleural disease seen previously in December on PET. We will hence proceed with CT chest tomorrow to better outline this area of concern.

## 2016-07-18 NOTE — Telephone Encounter (Signed)
Pt called stating she is wearing a brace still and is requesting a bed for her treatment on 3/15. Pt is already scheduled a bed. Called her back to let her know.

## 2016-07-19 ENCOUNTER — Ambulatory Visit (HOSPITAL_COMMUNITY)
Admission: RE | Admit: 2016-07-19 | Discharge: 2016-07-19 | Disposition: A | Discharge status: Home / Self Care | Payer: Medicare Other | Source: Ambulatory Visit | Attending: Radiation Oncology | Admitting: Radiation Oncology

## 2016-07-19 DIAGNOSIS — J90 Pleural effusion, not elsewhere classified: Secondary | ICD-10-CM | POA: Diagnosis not present

## 2016-07-19 DIAGNOSIS — C50911 Malignant neoplasm of unspecified site of right female breast: Secondary | ICD-10-CM | POA: Diagnosis not present

## 2016-07-19 DIAGNOSIS — C7801 Secondary malignant neoplasm of right lung: Secondary | ICD-10-CM

## 2016-07-19 DIAGNOSIS — R590 Localized enlarged lymph nodes: Secondary | ICD-10-CM | POA: Diagnosis not present

## 2016-07-19 MED ORDER — HEPARIN SOD (PORK) LOCK FLUSH 100 UNIT/ML IV SOLN
INTRAVENOUS | Status: AC
  Administered 2016-07-19: 500 [IU]
  Filled 2016-07-19: qty 5

## 2016-07-19 MED ORDER — IOPAMIDOL (ISOVUE-300) INJECTION 61%
INTRAVENOUS | Status: AC
  Administered 2016-07-19: 75 mL
  Filled 2016-07-19: qty 75

## 2016-07-19 MED ORDER — HEPARIN SOD (PORK) LOCK FLUSH 100 UNIT/ML IV SOLN: 500.0000 [IU] | Freq: Once | INTRAVENOUS | Status: DC

## 2016-07-21 ENCOUNTER — Ambulatory Visit: Payer: Medicare Other

## 2016-07-21 ENCOUNTER — Encounter: Payer: Self-pay | Admitting: Hematology and Oncology

## 2016-07-21 ENCOUNTER — Ambulatory Visit (HOSPITAL_BASED_OUTPATIENT_CLINIC_OR_DEPARTMENT_OTHER): Payer: Medicare Other

## 2016-07-21 ENCOUNTER — Other Ambulatory Visit (HOSPITAL_BASED_OUTPATIENT_CLINIC_OR_DEPARTMENT_OTHER): Payer: Medicare Other

## 2016-07-21 ENCOUNTER — Ambulatory Visit (HOSPITAL_BASED_OUTPATIENT_CLINIC_OR_DEPARTMENT_OTHER): Payer: Medicare Other | Admitting: Hematology and Oncology

## 2016-07-21 ENCOUNTER — Other Ambulatory Visit: Payer: Medicare Other

## 2016-07-21 VITALS — BP 134/67 | HR 73 | Temp 98.6°F | Resp 20

## 2016-07-21 DIAGNOSIS — Z170 Estrogen receptor positive status [ER+]: Secondary | ICD-10-CM

## 2016-07-21 DIAGNOSIS — C7951 Secondary malignant neoplasm of bone: Secondary | ICD-10-CM | POA: Diagnosis not present

## 2016-07-21 DIAGNOSIS — Z5112 Encounter for antineoplastic immunotherapy: Secondary | ICD-10-CM | POA: Diagnosis not present

## 2016-07-21 DIAGNOSIS — C50112 Malignant neoplasm of central portion of left female breast: Secondary | ICD-10-CM

## 2016-07-21 DIAGNOSIS — C792 Secondary malignant neoplasm of skin: Secondary | ICD-10-CM | POA: Diagnosis not present

## 2016-07-21 DIAGNOSIS — C50411 Malignant neoplasm of upper-outer quadrant of right female breast: Secondary | ICD-10-CM | POA: Diagnosis not present

## 2016-07-21 DIAGNOSIS — Z5111 Encounter for antineoplastic chemotherapy: Secondary | ICD-10-CM

## 2016-07-21 DIAGNOSIS — C50911 Malignant neoplasm of unspecified site of right female breast: Secondary | ICD-10-CM

## 2016-07-21 LAB — CBC WITH DIFFERENTIAL/PLATELET
BASO%: 1.2 % (ref 0.0–2.0)
Basophils Absolute: 0.1 10*3/uL (ref 0.0–0.1)
EOS%: 2.8 % (ref 0.0–7.0)
Eosinophils Absolute: 0.1 10*3/uL (ref 0.0–0.5)
HCT: 35.8 % (ref 34.8–46.6)
HGB: 11.7 g/dL (ref 11.6–15.9)
LYMPH%: 14.1 % (ref 14.0–49.7)
MCH: 27 pg (ref 25.1–34.0)
MCHC: 32.8 g/dL (ref 31.5–36.0)
MCV: 82.4 fL (ref 79.5–101.0)
MONO#: 0.4 10*3/uL (ref 0.1–0.9)
MONO%: 7.8 % (ref 0.0–14.0)
NEUT#: 3.4 10*3/uL (ref 1.5–6.5)
NEUT%: 74.1 % (ref 38.4–76.8)
Platelets: 223 10*3/uL (ref 145–400)
RBC: 4.34 10*6/uL (ref 3.70–5.45)
RDW: 14.8 % — ABNORMAL HIGH (ref 11.2–14.5)
WBC: 4.5 10*3/uL (ref 3.9–10.3)
lymph#: 0.6 10*3/uL — ABNORMAL LOW (ref 0.9–3.3)

## 2016-07-21 LAB — COMPREHENSIVE METABOLIC PANEL
ALT: 27 U/L (ref 0–55)
AST: 31 U/L (ref 5–34)
Albumin: 3.8 g/dL (ref 3.5–5.0)
Alkaline Phosphatase: 118 U/L (ref 40–150)
Anion Gap: 10 mEq/L (ref 3–11)
BUN: 13.4 mg/dL (ref 7.0–26.0)
CO2: 26 mEq/L (ref 22–29)
Calcium: 10.5 mg/dL — ABNORMAL HIGH (ref 8.4–10.4)
Chloride: 104 mEq/L (ref 98–109)
Creatinine: 0.8 mg/dL (ref 0.6–1.1)
EGFR: 80 mL/min/{1.73_m2} — ABNORMAL LOW (ref >90)
Glucose: 83 mg/dl (ref 70–140)
Potassium: 4.3 mEq/L (ref 3.5–5.1)
Sodium: 139 mEq/L (ref 136–145)
Total Bilirubin: 0.45 mg/dL (ref 0.20–1.20)
Total Protein: 7.3 g/dL (ref 6.4–8.3)

## 2016-07-21 IMAGING — MR MR THORACIC SPINE WO/W CM
6 of 13 series · 19 of 48 positions shown · IV contrast (multihance)
Comparison: MRI of the thoracic spine 02/14/2015.

CLINICAL DATA: Weakness in the lower extremities. Functional
deficit secondary to T5 cord compression and myelopathy from
pathologic compression fracture and metastatic breast cancer. Recent
laminectomy.

EXAM:
MRI THORACIC SPINE WITHOUT AND WITH CONTRAST
TECHNIQUE: Multiplanar and multiecho pulse sequences of the thoracic spine, to
include the craniocervical junction and cervicothoracic junction,
were obtained without and with intravenous contrast.
CONTRAST:  10mL MULTIHANCE GADOBENATE DIMEGLUMINE 529 MG/ML IV SOLN

[Series 4: T2 · sagittal · 3.0mm · 0.62mm/px · 3 of 14 slices shown (1 of 3)]
[im 1/14]
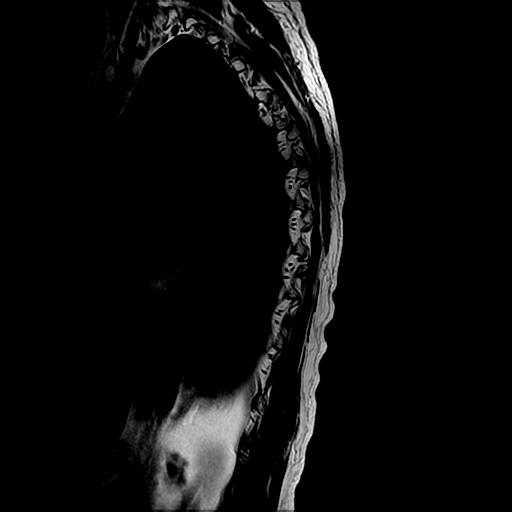
[im 7/14]
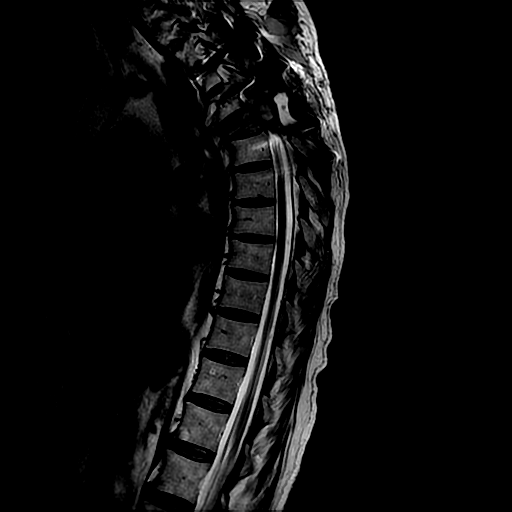
[im 14/14]
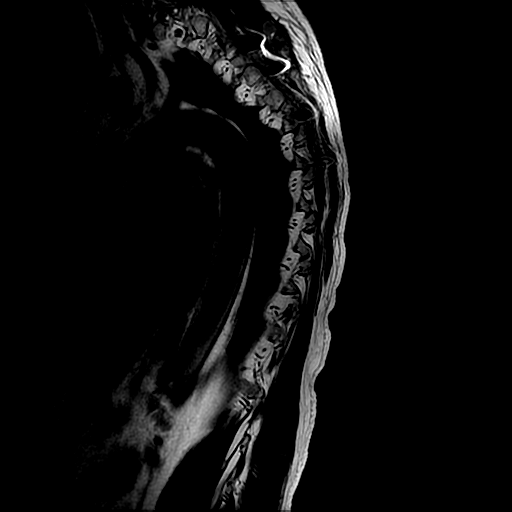

[Series 5: T1 · sagittal · 3.0mm · 0.62mm/px · 2 of 14 slices shown (1 of 3)]
[im 1/14]
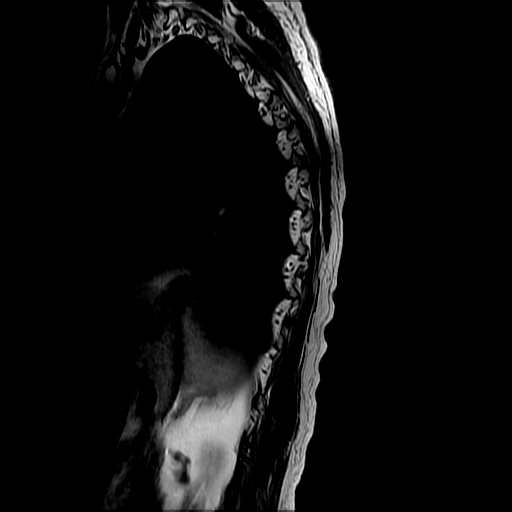
[im 14/14]
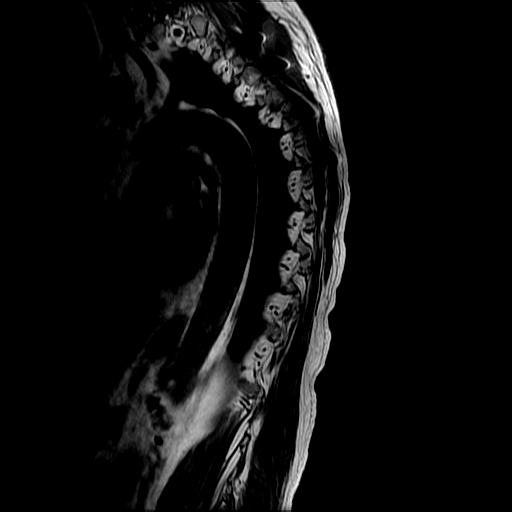

[Series 7: T2 · axial · 4.0mm · 0.43mm/px · z∈[-150,+62]mm · 4 of 24 slices shown (2 of 3)]
[im 1/24]
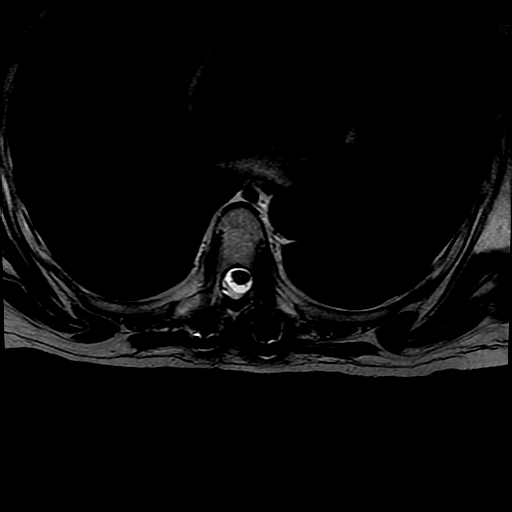
[im 8/24]
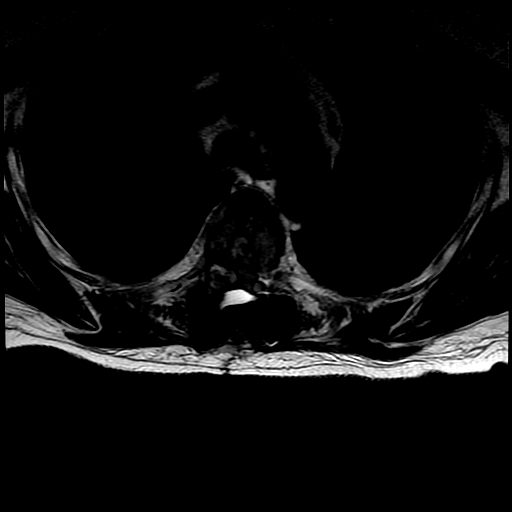
[im 16/24]
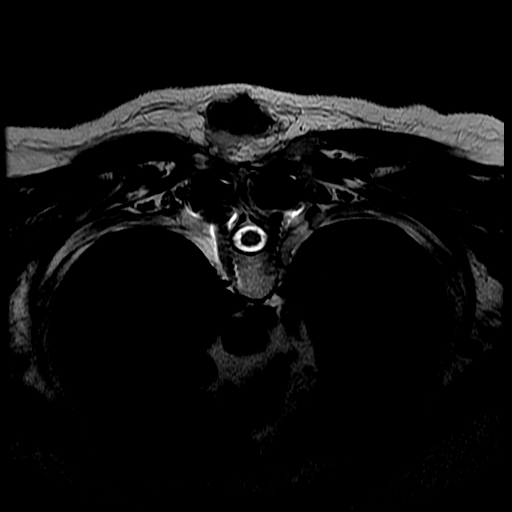
[im 24/24]
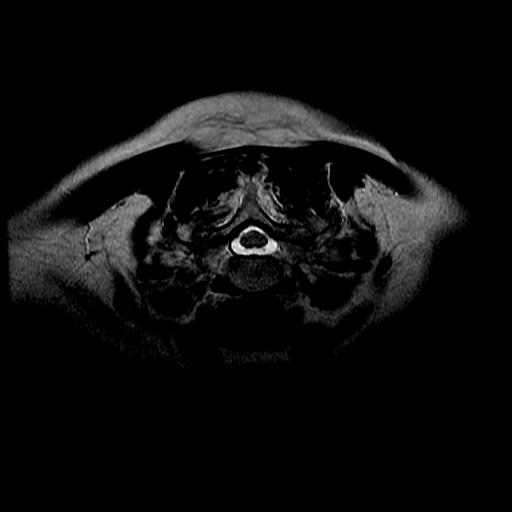

[Series 8: T2 · axial · 4.0mm · 0.43mm/px · z∈[-262,-112]mm · 5 of 31 slices shown (3 of 3)]
[im 1/31]
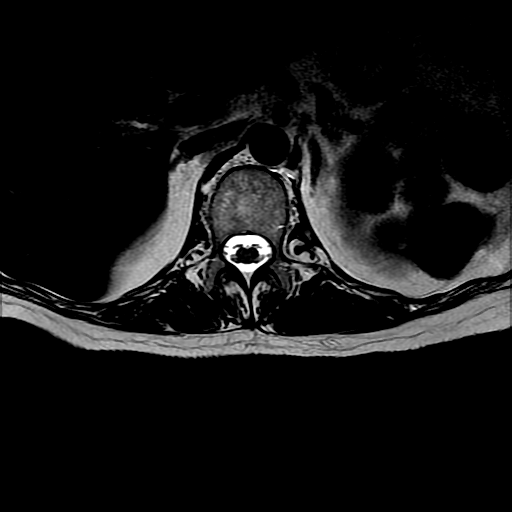
[im 8/31]
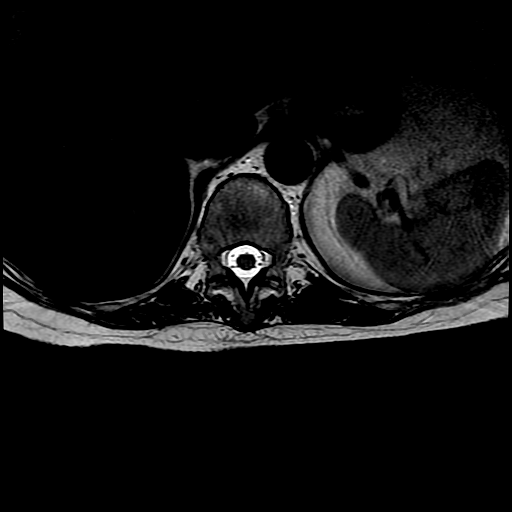
[im 16/31]
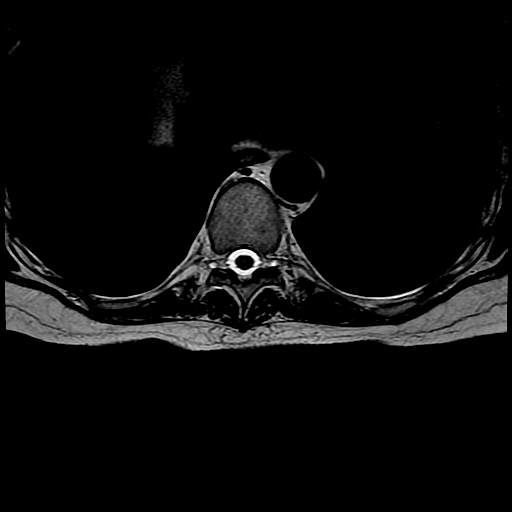
[im 23/31]
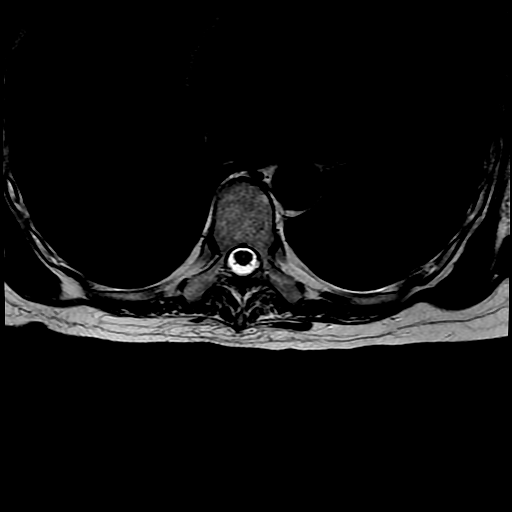
[im 31/31]
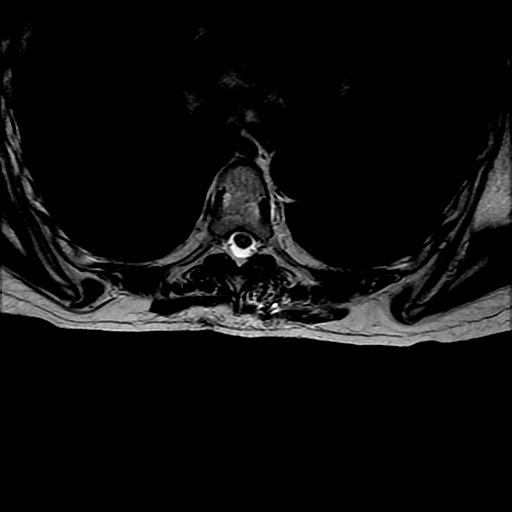

[Series 11: T1 · axial · non-contrast · 4.0mm · 0.43mm/px · z∈[-150,+62]mm · 4 of 24 slices shown (2 of 3)]
[im 1/24]
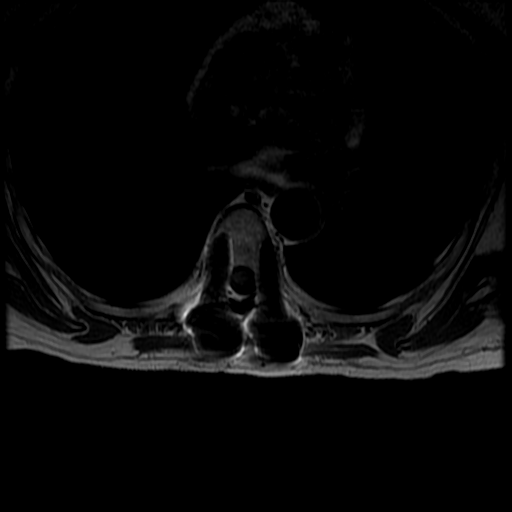
[im 8/24]
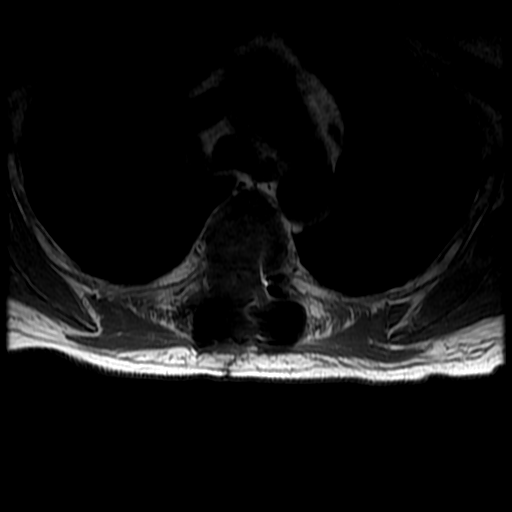
[im 16/24]
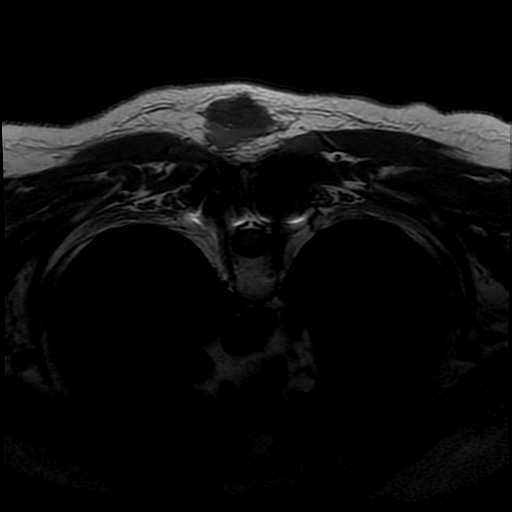
[im 24/24]
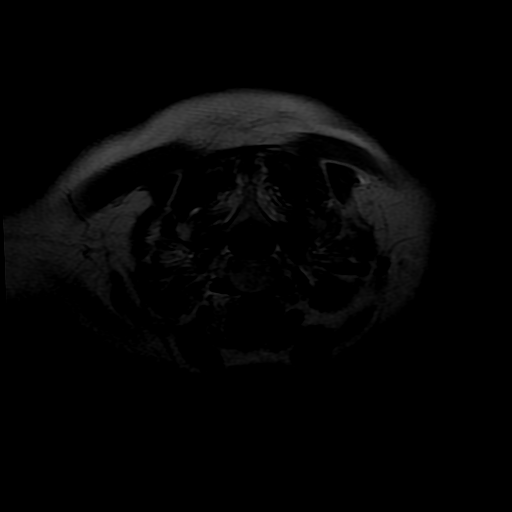

[Series 12: T1 · axial · non-contrast · 4.0mm · 0.43mm/px · 1 of 31 slices shown (3 of 3)]
[im 1/31]
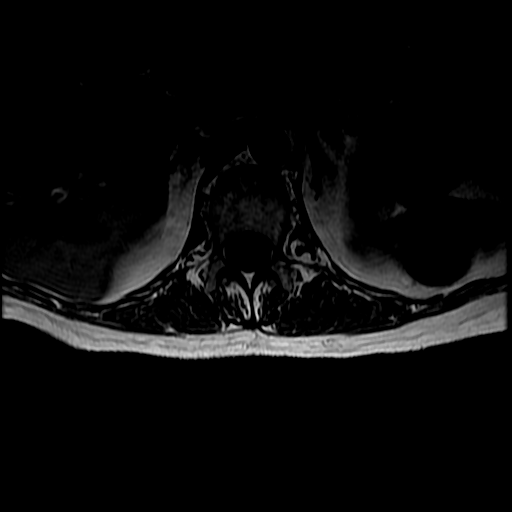

[19 of 48 positions shown; findings below may reference images not displayed]

FINDINGS: Laminectomy is noted at T5. There is posterior spinous stabilization
with pedicle screw and rod fixation at T3, T4, and T6 bilaterally.

There is residual enhancing tumor posterior to the spinal cord at
the T5 level. There some decompression. Cord is still narrowed to
3.5 mm at its minimal diameter. There is extensive tumor extending
into the posterior elements bilaterally, right greater than left. No
other osseous lesions are present. There is tumor within the foramen
on the right at T4-5 and to lesser extent at T5-6.

Exaggerated kyphosis is again noted at T5, similar to the prior
exam. Slight anterolisthesis of T4 on T6 is noted.

The remaining vertebral body heights and alignment maintained. The
foramina are patent throughout the remainder of the thoracic spine.
IMPRESSION: 1. T5 laminectomy with partial decompression of the spinal canal.
2. There is residual enhancing tissue posterior to the spinal cord
which narrows the cord to 3.5 mm at the level plaque sickle mole
compression.
3. Residual enhancing tissue in the right foramen at T4-5 and T5-6
likely representing tumor.
4. Posterior stabilization hardware T3-7.
5. The lower thoracic spine is unremarkable.

## 2016-07-21 MED ORDER — SODIUM CHLORIDE 0.9 % IV SOLN
840.0000 mg | Freq: Once | INTRAVENOUS | Status: AC
  Administered 2016-07-21: 840 mg via INTRAVENOUS
  Filled 2016-07-21: qty 28

## 2016-07-21 MED ORDER — HEPARIN SOD (PORK) LOCK FLUSH 100 UNIT/ML IV SOLN
500.0000 [IU] | Freq: Once | INTRAVENOUS | Status: AC | PRN
  Administered 2016-07-21: 500 [IU]
  Filled 2016-07-21: qty 5

## 2016-07-21 MED ORDER — ALTEPLASE 2 MG IJ SOLR
2.0000 mg | Freq: Once | INTRAMUSCULAR | Status: DC | PRN
  Filled 2016-07-21: qty 2

## 2016-07-21 MED ORDER — HEPARIN SOD (PORK) LOCK FLUSH 100 UNIT/ML IV SOLN
500.0000 [IU] | Freq: Once | INTRAVENOUS | Status: DC | PRN
  Filled 2016-07-21: qty 5

## 2016-07-21 MED ORDER — FULVESTRANT 250 MG/5ML IM SOLN
500.0000 mg | Freq: Once | INTRAMUSCULAR | Status: AC
  Administered 2016-07-21: 500 mg via INTRAMUSCULAR
  Filled 2016-07-21: qty 10

## 2016-07-21 MED ORDER — TRASTUZUMAB CHEMO 150 MG IV SOLR
6.0000 mg/kg | Freq: Once | INTRAVENOUS | Status: AC
  Administered 2016-07-21: 378 mg via INTRAVENOUS
  Filled 2016-07-21: qty 18

## 2016-07-21 MED ORDER — SODIUM CHLORIDE 0.9 % IV SOLN
Freq: Once | INTRAVENOUS | Status: AC
  Administered 2016-07-21: 13:00:00 via INTRAVENOUS

## 2016-07-21 MED ORDER — DIPHENHYDRAMINE HCL 25 MG PO CAPS
ORAL_CAPSULE | ORAL | Status: AC
  Filled 2016-07-21: qty 2

## 2016-07-21 MED ORDER — ACETAMINOPHEN 325 MG PO TABS
650.0000 mg | ORAL_TABLET | Freq: Once | ORAL | Status: AC
  Administered 2016-07-21: 650 mg via ORAL

## 2016-07-21 MED ORDER — ZOLEDRONIC ACID 4 MG/5ML IV CONC: 4.0000 mg | Freq: Once | INTRAVENOUS | Status: DC

## 2016-07-21 MED ORDER — ACETAMINOPHEN 325 MG PO TABS
ORAL_TABLET | ORAL | Status: AC
  Filled 2016-07-21: qty 2

## 2016-07-21 MED ORDER — SODIUM CHLORIDE 0.9% FLUSH
10.0000 mL | INTRAVENOUS | Status: DC | PRN
  Administered 2016-07-21: 10 mL
  Filled 2016-07-21: qty 10

## 2016-07-21 MED ORDER — DIPHENHYDRAMINE HCL 25 MG PO CAPS
50.0000 mg | ORAL_CAPSULE | Freq: Once | ORAL | Status: AC
  Administered 2016-07-21: 50 mg via ORAL

## 2016-07-21 MED ORDER — SODIUM CHLORIDE 0.9 % IJ SOLN
10.0000 mL | INTRAMUSCULAR | Status: DC | PRN
  Administered 2016-07-21: 10 mL via INTRAVENOUS
  Filled 2016-07-21: qty 10

## 2016-07-21 NOTE — Patient Instructions (Signed)
[facility] Discharge Instructions for Patients Receiving Chemotherapy  Today you received the following chemotherapy agents:  Herceptin (trastuzumab), Perjeta (pertuzumab)  To help prevent nausea and vomiting after your treatment, we encourage you to take your nausea medication as prescribed.   If you develop nausea and vomiting that is not controlled by your nausea medication, call the clinic.   BELOW ARE SYMPTOMS THAT SHOULD BE REPORTED IMMEDIATELY:  *FEVER GREATER THAN 100.5 F  *CHILLS WITH OR WITHOUT FEVER  NAUSEA AND VOMITING THAT IS NOT CONTROLLED WITH YOUR NAUSEA MEDICATION  *UNUSUAL SHORTNESS OF BREATH  *UNUSUAL BRUISING OR BLEEDING  TENDERNESS IN MOUTH AND THROAT WITH OR WITHOUT PRESENCE OF ULCERS  *URINARY PROBLEMS  *BOWEL PROBLEMS  UNUSUAL RASH Items with * indicate a potential emergency and should be followed up as soon as possible.  Feel free to call the clinic you have any questions or concerns. The clinic phone number is [phone].  Please show the Branch at check-in to the Emergency Department and triage nurse.      Trastuzumab injection for infusion What is this medicine? TRASTUZUMAB (tras TOO zoo mab) is a monoclonal antibody. It is used to treat breast cancer and stomach cancer. This medicine may be used for other purposes; ask your health care provider or pharmacist if you have questions. COMMON BRAND NAME(S): Herceptin What should I tell my health care provider before I take this medicine? They need to know if you have any of these conditions: -heart disease -heart failure -lung or breathing disease, like asthma -an unusual or allergic reaction to trastuzumab, benzyl alcohol, or other medications, foods, dyes, or preservatives -pregnant or trying to get pregnant -breast-feeding How should I use this medicine? This drug is given as an infusion into a vein. It is administered in a hospital or clinic by a specially  trained health care professional. Talk to your pediatrician regarding the use of this medicine in children. This medicine is not approved for use in children. Overdosage: If you think you have taken too much of this medicine contact a poison control center or emergency room at once. NOTE: This medicine is only for you. Do not share this medicine with others. What if I miss a dose? It is important not to miss a dose. Call your doctor or health care professional if you are unable to keep an appointment. What may interact with this medicine? This medicine may interact with the following medications: -certain types of chemotherapy, such as daunorubicin, doxorubicin, epirubicin, and idarubicin This list may not describe all possible interactions. Give your health care provider a list of all the medicines, herbs, non-prescription drugs, or dietary supplements you use. Also tell them if you smoke, drink alcohol, or use illegal drugs. Some items may interact with your medicine. What should I watch for while using this medicine? Visit your doctor for checks on your progress. Report any side effects. Continue your course of treatment even though you feel ill unless your doctor tells you to stop. Call your doctor or health care professional for advice if you get a fever, chills or sore throat, or other symptoms of a cold or flu. Do not treat yourself. Try to avoid being around people who are sick. You may experience fever, chills and shaking during your first infusion. These effects are usually mild and can be treated with other medicines. Report any side effects during the infusion to your health care professional. Fever and chills usually do not happen with  later infusions. Do not become pregnant while taking this medicine or for 7 months after stopping it. Women should inform their doctor if they wish to become pregnant or think they might be pregnant. Women of child-bearing potential will need to have a  negative pregnancy test before starting this medicine. There is a potential for serious side effects to an unborn child. Talk to your health care professional or pharmacist for more information. Do not breast-feed an infant while taking this medicine or for 7 months after stopping it. Women must use effective birth control with this medicine. What side effects may I notice from receiving this medicine? Side effects that you should report to your doctor or health care professional as soon as possible: -allergic reactions like skin rash, itching or hives, swelling of the face, lips, or tongue -chest pain or palpitations -cough -dizziness -feeling faint or lightheaded, falls -fever -general ill feeling or flu-like symptoms -signs of worsening heart failure like breathing problems; swelling in your legs and feet -unusually weak or tired Side effects that usually do not require medical attention (report to your doctor or health care professional if they continue or are bothersome): -bone pain -changes in taste -diarrhea -joint pain -nausea/vomiting -weight loss This list may not describe all possible side effects. Call your doctor for medical advice about side effects. You may report side effects to FDA at 1-800-FDA-1088. Where should I keep my medicine? This drug is given in a hospital or clinic and will not be stored at home. NOTE: This sheet is a summary. It may not cover all possible information. If you have questions about this medicine, talk to your doctor, pharmacist, or health care provider.  2018 Elsevier/Gold Standard (2016-04-19 14:37:52)    Pertuzumab injection What is this medicine? PERTUZUMAB (per TOOZ ue mab) is a monoclonal antibody. It is used to treat breast cancer. This medicine may be used for other purposes; ask your health care provider or pharmacist if you have questions. COMMON BRAND NAME(S): PERJETA What should I tell my health care provider before I take this  medicine? They need to know if you have any of these conditions: -heart disease -heart failure -high blood pressure -history of irregular heart beat -recent or ongoing radiation therapy -an unusual or allergic reaction to pertuzumab, other medicines, foods, dyes, or preservatives -pregnant or trying to get pregnant -breast-feeding How should I use this medicine? This medicine is for infusion into a vein. It is given by a health care professional in a hospital or clinic setting. Talk to your pediatrician regarding the use of this medicine in children. Special care may be needed. Overdosage: If you think you have taken too much of this medicine contact a poison control center or emergency room at once. NOTE: This medicine is only for you. Do not share this medicine with others. What if I miss a dose? It is important not to miss your dose. Call your doctor or health care professional if you are unable to keep an appointment. What may interact with this medicine? Interactions are not expected. Give your health care provider a list of all the medicines, herbs, non-prescription drugs, or dietary supplements you use. Also tell them if you smoke, drink alcohol, or use illegal drugs. Some items may interact with your medicine. This list may not describe all possible interactions. Give your health care provider a list of all the medicines, herbs, non-prescription drugs, or dietary supplements you use. Also tell them if you smoke, drink alcohol, or use  illegal drugs. Some items may interact with your medicine. What should I watch for while using this medicine? Your condition will be monitored carefully while you are receiving this medicine. Report any side effects. Continue your course of treatment even though you feel ill unless your doctor tells you to stop. Do not become pregnant while taking this medicine or for 7 months after stopping it. Women should inform their doctor if they wish to become  pregnant or think they might be pregnant. Women of child-bearing potential will need to have a negative pregnancy test before starting this medicine. There is a potential for serious side effects to an unborn child. Talk to your health care professional or pharmacist for more information. Do not breast-feed an infant while taking this medicine or for 7 months after stopping it. Women must use effective birth control with this medicine. Call your doctor or health care professional for advice if you get a fever, chills or sore throat, or other symptoms of a cold or flu. Do not treat yourself. Try to avoid being around people who are sick. You may experience fever, chills, and headache during the infusion. Report any side effects during the infusion to your health care professional. What side effects may I notice from receiving this medicine? Side effects that you should report to your doctor or health care professional as soon as possible: -breathing problems -chest pain or palpitations -dizziness -feeling faint or lightheaded -fever or chills -skin rash, itching or hives -sore throat -swelling of the face, lips, or tongue -swelling of the legs or ankles -unusually weak or tired Side effects that usually do not require medical attention (report to your doctor or health care professional if they continue or are bothersome): -diarrhea -hair loss -nausea, vomiting -tiredness This list may not describe all possible side effects. Call your doctor for medical advice about side effects. You may report side effects to FDA at 1-800-FDA-1088. Where should I keep my medicine? This drug is given in a hospital or clinic and will not be stored at home. NOTE: This sheet is a summary. It may not cover all possible information. If you have questions about this medicine, talk to your doctor, pharmacist, or health care provider.  2018 [employer]/Gold Standard (2015-05-28 12:08:50)

## 2016-07-21 NOTE — Patient Instructions (Signed)
Implanted Port Home Guide An implanted port is a type of central line that is placed under the skin. Central lines are used to provide IV access when treatment or nutrition needs to be given through a person's veins. Implanted ports are used for long-term IV access. An implanted port may be placed because:  You need IV medicine that would be irritating to the small veins in your hands or arms.  You need long-term IV medicines, such as antibiotics.  You need IV nutrition for a long period.  You need frequent blood draws for lab tests.  You need dialysis.  Implanted ports are usually placed in the chest area, but they can also be placed in the upper arm, the abdomen, or the leg. An implanted port has two main parts:  Reservoir. The reservoir is round and will appear as a small, raised area under your skin. The reservoir is the part where a needle is inserted to give medicines or draw blood.  Catheter. The catheter is a thin, flexible tube that extends from the reservoir. The catheter is placed into a large vein. Medicine that is inserted into the reservoir goes into the catheter and then into the vein.  How will I care for my incision site? Do not get the incision site wet. Bathe or shower as directed by your health care provider. How is my port accessed? Special steps must be taken to access the port:  Before the port is accessed, a numbing cream can be placed on the skin. This helps numb the skin over the port site.  Your health care provider uses a sterile technique to access the port. ? Your health care provider must put on a mask and sterile gloves. ? The skin over your port is cleaned carefully with an antiseptic and allowed to dry. ? The port is gently pinched between sterile gloves, and a needle is inserted into the port.  Only "non-coring" port needles should be used to access the port. Once the port is accessed, a blood return should be checked. This helps ensure that the port  is in the vein and is not clogged.  If your port needs to remain accessed for a constant infusion, a clear (transparent) bandage will be placed over the needle site. The bandage and needle will need to be changed every week, or as directed by your health care provider.  Keep the bandage covering the needle clean and dry. Do not get it wet. Follow your health care provider's instructions on how to take a shower or bath while the port is accessed.  If your port does not need to stay accessed, no bandage is needed over the port.  What is flushing? Flushing helps keep the port from getting clogged. Follow your health care provider's instructions on how and when to flush the port. Ports are usually flushed with saline solution or a medicine called heparin. The need for flushing will depend on how the port is used.  If the port is used for intermittent medicines or blood draws, the port will need to be flushed: ? After medicines have been given. ? After blood has been drawn. ? As part of routine maintenance.  If a constant infusion is running, the port may not need to be flushed.  How long will my port stay implanted? The port can stay in for as long as your health care provider thinks it is needed. When it is time for the port to come out, surgery will be   done to remove it. The procedure is similar to the one performed when the port was put in. When should I seek immediate medical care? When you have an implanted port, you should seek immediate medical care if:  You notice a bad smell coming from the incision site.  You have swelling, redness, or drainage at the incision site.  You have more swelling or pain at the port site or the surrounding area.  You have a fever that is not controlled with medicine.  This information is not intended to replace advice given to you by your health care provider. Make sure you discuss any questions you have with your health care provider. Document  Released: 04/25/2005 Document Revised: 10/01/2015 Document Reviewed: 12/31/2012 Else[employer] Interactive Patient Education  2017 [employer]  

## 2016-07-21 NOTE — Progress Notes (Signed)
Patient Care Team: [name], MD as PCP - General (Family Medicine)  DIAGNOSIS:  Encounter Diagnosis  Name Primary?  . Primary cancer of upper outer quadrant of right female breast ([place])     SUMMARY OF ONCOLOGIC HISTORY:   Primary cancer of upper outer quadrant of right female breast ([place])   03/08/2012 Initial Diagnosis    Right breast invasive ductal carcinoma ER positive PR negative HER-2 positive Ki-67 44%; another breast mass biopsied in the anterior part of the breast which was also positive for malignancy that was HER-2 negative      05/10/2012 Surgery    Right mastectomy and axillary lymph node dissection: Multifocal disease 5 cm, 1.7 cm, 1.6 cm, ER positive PR negative HER-2 positive Ki-67 44%, 3/7 lymph nodes positive      06/14/2012 - 06/12/2013 Chemotherapy    Adjuvant chemotherapy with [place] 6 followed by Herceptin maintenance      10/25/2012 - 12/11/2012 Radiation Therapy    Adjuvant radiation therapy      02/07/2013 -  Anti-estrogen oral therapy    Letrozole 2.5 mg daily      02/14/2015 Imaging    MRI spine: Large destructive T5 lesion with severe pathologic compression fracture and extensive epidural tumor, severe spinal stenosis with moderate cord compression, tumor involvement of T4      03/18/2015 PET scan    Residual enhancing soft tissue adjacent to the spinal cord. No evidence of metastatic disease. Nonspecific uptake the left nipple      03/27/2015 - 03/30/2015 Hospital Admission    T4-6 decompression for spinal cord compression (lower extremity paralysis)      04/10/2015 -  Chemotherapy    Palliative treatment with Herceptin every 3 weeks with letrozole 2.5 mg daily      04/13/2015 - 05/19/2015 Radiation Therapy    Palliative radiation treatment to the spine      09/01/2015 Imaging    CT chest abdomen pelvis: Pathologic fracture with posterior fusion at T5 no other evidence of metastatic disease in the chest abdomen pelvis      12/23/2015 Imaging      CT CAP: new nonspecific 0.6 cm lymph node was stated mediastinum needs follow-up CT, innumerable tiny groundglass pulmonary nodules throughout both lungs unchanged, patchy consolidation from radiation, T5 fracture, no mets      04/15/2016 PET scan    Rt para-spinal mass mildy hypermetabolic SUV 5.9, lytic cortical lesion 4.8 cm lesion left prox femur suv 8.8 favor osseus met disease      04/28/2016 - 05/12/2016 Radiation Therapy    Palliative XRT Left femur      05/24/2016 Procedure    Soft tissue mass biopsy back: Metastatic breast cancer ER 80%, PR 0%, Ki-67 50%, HER-2 positive ratio 2.25      06/09/2016 -  Anti-estrogen oral therapy    Faslodex with Herceptin and Perjeta every 4 weeks      06/13/2016 - 06/15/2016 Hospital Admission    uncomplicated revision of previous thoracic instrumentation.       CHIEF COMPLIANT: Follow-up to therapy with Herceptin Perjeta with Faslodex  INTERVAL HISTORY: Heather [name] is a 71 year old with above-mentioned history of metastatic breast cancer who had a major surgery on the spine 2 stabilize it and has recovered very well from that surgery. She is here to initiate therapy with Perjeta in addition to Herceptin and Faslodex. She complained of achiness in the bones related to Faslodex. She had a recent spine MRIs which suggested pleural thickening. She  had a CT chest that confirmed that she does have pleural thickening but it is no different than the prior scans. She was just recently started on Faslodex. Perjeta was not given immediately because she is worried about diarrhea risk in the immediate postoperative state. She is here today to receive her first dose of Perjeta.  REVIEW OF SYSTEMS:   Constitutional: Denies fevers, chills or abnormal weight loss Eyes: Denies blurriness of vision Ears, nose, mouth, throat, and face: Denies mucositis or sore throat Respiratory: Denies cough, dyspnea or wheezes Cardiovascular: Denies palpitation, chest  discomfort Gastrointestinal:  Denies nausea, heartburn or change in bowel habits Skin: Denies abnormal skin rashes Lymphatics: Denies new lymphadenopathy or easy bruising Neurological: Weakness related to prior cord compression able to walk with the help of a cane Behavioral/Psych: Mood is stable, no new changes  Extremities: No lower extremity edema Breast:  denies any pain or lumps or nodules in either breasts All other systems were reviewed with the patient and are negative.  I have reviewed the past medical history, past surgical history, social history and family history with the patient and they are unchanged from previous note.  ALLERGIES:  is allergic to other; acyclovir and related; codeine; keflex [cephalexin]; naprosyn [naproxen]; oxycodone; tramadol; and tussin [guaifenesin].  MEDICATIONS:  Current Outpatient Prescriptions  Medication Sig Dispense Refill  . b complex vitamins tablet Take 1 tablet by mouth daily.    . Calcium Carbonate Antacid (TUMS PO) Take 2 tablets by mouth 2 (two) times daily as needed (acid reflux).    . Cholecalciferol (VITAMIN D) 2000 UNITS tablet Take 2,000 Units by mouth daily.    . Coenzyme Q10 (COQ10) 100 MG CAPS Take 300 mg by mouth at bedtime.     . gabapentin (NEURONTIN) 600 MG tablet Take 1 tablet (600 mg total) by mouth as directed. Take 1 tablet in the AM and afternoon and 1 1/2 tabs at bedtime 315 tablet 2  . HYDROcodone-acetaminophen (NORCO/VICODIN) 5-325 MG tablet Take 1-2 tablets by mouth every 4 (four) hours as needed (mild pain). (Patient not taking: Reported on 07/05/2016) 60 tablet 0  . lidocaine-prilocaine (EMLA) cream Apply to affected area once (Patient taking differently: Apply 1 application topically every 21 ( twenty-one) days. Apply to port prior to infusions) 30 g 3  . OVER THE COUNTER MEDICATION Place 1 drop into both eyes 2 (two) times daily as needed (dry eyes). Over the counter lubricating eye drops    . polyethylene glycol  (MIRALAX / GLYCOLAX) packet Take 17 g by mouth 2 (two) times daily. (Patient taking differently: Take 17 g by mouth daily as needed for moderate constipation. ) 100 each 0  . pravastatin (PRAVACHOL) 10 MG tablet Take 10 mg by mouth every Monday, Wednesday, and Friday.     . Probiotic Product (PROBIOTIC DAILY PO) Take 1 capsule by mouth daily.     . prochlorperazine (COMPAZINE) 10 MG tablet Take 10 mg by mouth every 6 (six) hours as needed for nausea or vomiting.    . ranitidine (ZANTAC) 300 MG tablet Take 300 mg by mouth at bedtime as needed. Reported on 06/25/2015    . spironolactone (ALDACTONE) 25 MG tablet Take 0.5 tablets (12.5 mg total) by mouth daily. (Patient not taking: Reported on 07/05/2016) 45 tablet 12   No current facility-administered medications for this visit.     PHYSICAL EXAMINATION: ECOG PERFORMANCE STATUS: 1 - Symptomatic but completely ambulatory  Vitals:   07/21/16 1055  BP: (!) 136/59  Pulse: 74  Resp: 18  Temp: 97.3 F (36.3 C)   Filed Weights   07/21/16 1055  Weight: 141 lb 3 oz (64 kg)    GENERAL:alert, no distress and comfortable SKIN: skin color, texture, turgor are normal, no rashes or significant lesions EYES: normal, Conjunctiva are pink and non-injected, sclera clear OROPHARYNX:no exudate, no erythema and lips, buccal mucosa, and tongue normal  NECK: supple, thyroid normal size, non-tender, without nodularity LYMPH:  no palpable lymphadenopathy in the cervical, axillary or inguinal LUNGS: clear to auscultation and percussion with normal breathing effort HEART: regular rate & rhythm and no murmurs and no lower extremity edema ABDOMEN:abdomen soft, non-tender and normal bowel sounds MUSCULOSKELETAL:no cyanosis of digits and no clubbing  NEURO: alert & oriented x 3 with fluent speech, no focal motor/sensory deficits EXTREMITIES: No lower extremity edema   LABORATORY DATA:  I have reviewed the data as listed   Chemistry      Component Value  Date/Time   NA 139 07/21/2016 1009   K 4.3 07/21/2016 1009   CL 103 06/14/2016 0413   CL 101 10/25/2012 0946   CO2 26 07/21/2016 1009   BUN 13.4 07/21/2016 1009   CREATININE 0.8 07/21/2016 1009      Component Value Date/Time   CALCIUM 10.5 (H) 07/21/2016 1009   ALKPHOS 118 07/21/2016 1009   AST 31 07/21/2016 1009   ALT 27 07/21/2016 1009   BILITOT 0.45 07/21/2016 1009       Lab Results  Component Value Date   WBC 4.5 07/21/2016   HGB 11.7 07/21/2016   HCT 35.8 07/21/2016   MCV 82.4 07/21/2016   PLT 223 07/21/2016   NEUTROABS 3.4 07/21/2016    ASSESSMENT & PLAN:  Primary cancer of upper outer quadrant of right female breast ([facility]) Right breast invasive ductal carcinoma ER positive PR negative HER-2 positive Ki-67 44% multifocal disease 3/7 lymph nodes positive T2 N1 M0 stage IIB status post adjuvant chemotherapy with TCH followed by Herceptin maintenance, adjuvant radiation therapy and Letrozole. MRI 02/14/2015: T5 large destructive lesion with pathologic compression fracture and extensive epidural tumor, involvement of T4, excision metastatic carcinoma ER 60%, PR 0%, HER-2 positive ratio 2.71 average copy #7.45 03/27/2015: T4-6 decompression surgery for spinal cord compression PET-CT 04/17/16 Rt para-spinal mass mildy hypermetabolic SUV 5.9, lytic cortical lesion 4.8 cm lesion left prox femur suv 8.8 favor osseus met disease   Treatment summary: 1. Resumed anastrozole 05/21/2015, but it has caused significant hair loss so we switched her to exemestane 25 mg once daily 07/23/2015 2. Herceptin every 3 week started 04/10/2015 3. Bone metastases: Zometa every 3 months. 4. Radiation therapy to the spine (Dr. [name]) 04/13/2015 to 05/19/2015 5. Radiation therapy to left femur 04/28/2016 to 05/12/2016  -------------------------------------------------------------------------------------------------------------------------- Goal of treatment: Palliation Zometa toxicities: bone  pain and fatigue. If it causes persistent symptoms, Zometa every 3 months.  Biopsy of the subcutaneous nodule 05/16/16:Metastatic breast cancer ER 80%, PR 0%, HER-2 positive ratio 2.25, Ki-67 50%  S/P revision of back spine instrumentation 06/13/16  Treatment plan: 1.  Faslodex along with Herceptin and Perjeta first dose of Faslodex given 06/09/2016, postponing Perjeta to March because if she has diarrhea, it would be very difficult for her to get to the bathroom because of the recent spine surgery. 2. today she will receive both Herceptin and Perjeta.  MRI spine 07/13/2016: Stable (comment was made about a new right pleural metastases with chest wall invasion and small right malignant pleural effusion) CT chest 07/19/2016: Small right pleural effusion,  no significant change in this soft tissue density in the right posterior medial pleural space, highly suspicious for pleural metastatic disease slight increase in mediastinal lymphadenopathy  These scans will be our baseline to compare the future scans. Our plan is to obtain a PET/CT scan in 3 months from now.  Patient discussed with me that she does not wish to undergo another MRI of the spine on this she has any symptoms. We discussed other options including Ibrance and Kadcyla   I spent 25 minutes talking to the patient of which more than half was spent in counseling and coordination of care.  No orders of the defined types were placed in this encounter.  The patient has a good understanding of the overall plan. she agrees with it. she will call with any problems that may develop before the next visit here.   Rulon Eisenmenger, MD 07/21/16

## 2016-07-21 NOTE — Assessment & Plan Note (Signed)
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 MRI 02/14/2015: T5 large destructive lesion with pathologic compression fracture and extensive epidural tumor, involvement of T4, excision metastatic carcinoma ER 60%, PR 0%, HER-2 positive ratio 2.71 average copy #7.45 03/27/2015: T4-6 decompression surgery for spinal cord compression PET-CT 04/17/16 Rt para-spinal mass mildy hypermetabolic SUV 5.9, lytic cortical lesion 4.8 cm lesion left prox femur suv 8.8 favor osseus met disease   Treatment summary: 1. Resumed anastrozole 05/21/2015, but it has caused significant hair loss so we switched her to exemestane 25 mg once daily 07/23/2015 2. Herceptin every 3 week started 04/10/2015 3. Bone metastases: Zometa every 3 months. 4. Radiation therapy to the spine (Dr. [name]) 04/13/2015 to 05/19/2015 5. Radiation therapy to left femur 04/28/2016 to 05/12/2016  -------------------------------------------------------------------------------------------------------------------------- Goal of treatment: Palliation Zometa toxicities: bone pain and fatigue. If it causes persistent symptoms, Zometa every 3 months.  Biopsy of the subcutaneous nodule 05/16/16:Metastatic breast cancer ER 80%, PR 0%, HER-2 positive ratio 2.25, Ki-67 50%  S/P revision of back spine instrumentation 06/13/16  Treatment plan: 1.  Faslodex along with Herceptin and Perjeta first dose of Faslodex given 06/09/2016, postponing Perjeta to March because if she has diarrhea, it would be very difficult for her to get to the bathroom because of the recent spine surgery. 2. today she will receive both Herceptin and Perjeta.  MRI spine 07/13/2016: Stable (comment was made about a new right pleural metastases with chest wall  invasion and small right malignant pleural effusion) CT chest 07/19/2016: Small right pleural effusion, no significant change in this soft tissue density in the right posterior medial pleural space, highly suspicious for pleural metastatic disease slight increase in mediastinal lymphadenopathy  These scans will be our baseline to compare the future scans. Our plan is to obtain a PET/CT scan in 3 months from now.  We discussed other options including Ibrance and Kadcyla

## 2016-07-22 DIAGNOSIS — D649 Anemia, unspecified: Secondary | ICD-10-CM | POA: Diagnosis not present

## 2016-07-22 DIAGNOSIS — E785 Hyperlipidemia, unspecified: Secondary | ICD-10-CM | POA: Diagnosis not present

## 2016-07-22 DIAGNOSIS — T84296D Other mechanical complication of internal fixation device of vertebrae, subsequent encounter: Secondary | ICD-10-CM | POA: Diagnosis not present

## 2016-07-22 DIAGNOSIS — F419 Anxiety disorder, unspecified: Secondary | ICD-10-CM | POA: Diagnosis not present

## 2016-07-22 DIAGNOSIS — Z853 Personal history of malignant neoplasm of breast: Secondary | ICD-10-CM | POA: Diagnosis not present

## 2016-07-22 DIAGNOSIS — Z85830 Personal history of malignant neoplasm of bone: Secondary | ICD-10-CM | POA: Diagnosis not present

## 2016-07-26 DIAGNOSIS — M5414 Radiculopathy, thoracic region: Secondary | ICD-10-CM | POA: Diagnosis not present

## 2016-07-26 DIAGNOSIS — G620 Drug-induced polyneuropathy: Secondary | ICD-10-CM | POA: Diagnosis not present

## 2016-07-26 DIAGNOSIS — C7951 Secondary malignant neoplasm of bone: Secondary | ICD-10-CM | POA: Diagnosis not present

## 2016-07-26 DIAGNOSIS — T451X5A Adverse effect of antineoplastic and immunosuppressive drugs, initial encounter: Secondary | ICD-10-CM | POA: Diagnosis not present

## 2016-08-01 DIAGNOSIS — M5414 Radiculopathy, thoracic region: Secondary | ICD-10-CM | POA: Diagnosis not present

## 2016-08-12 ENCOUNTER — Other Ambulatory Visit: Payer: Self-pay | Admitting: Radiation Therapy

## 2016-08-12 DIAGNOSIS — C7951 Secondary malignant neoplasm of bone: Secondary | ICD-10-CM

## 2016-08-15 DIAGNOSIS — T84498A Other mechanical complication of other internal orthopedic devices, implants and grafts, initial encounter: Secondary | ICD-10-CM | POA: Diagnosis not present

## 2016-08-15 DIAGNOSIS — I10 Essential (primary) hypertension: Secondary | ICD-10-CM | POA: Diagnosis not present

## 2016-08-18 ENCOUNTER — Ambulatory Visit: Payer: Medicare Other

## 2016-08-18 ENCOUNTER — Other Ambulatory Visit: Payer: Self-pay

## 2016-08-18 ENCOUNTER — Other Ambulatory Visit: Payer: Medicare Other

## 2016-08-18 ENCOUNTER — Telehealth (HOSPITAL_COMMUNITY): Payer: Self-pay | Admitting: Vascular Surgery

## 2016-08-18 NOTE — Telephone Encounter (Signed)
Left pt message to make brst f/u w/ Echo

## 2016-08-19 ENCOUNTER — Ambulatory Visit (HOSPITAL_BASED_OUTPATIENT_CLINIC_OR_DEPARTMENT_OTHER): Payer: Medicare Other | Admitting: Hematology and Oncology

## 2016-08-19 ENCOUNTER — Ambulatory Visit: Payer: Medicare Other

## 2016-08-19 ENCOUNTER — Other Ambulatory Visit (HOSPITAL_BASED_OUTPATIENT_CLINIC_OR_DEPARTMENT_OTHER): Payer: Medicare Other

## 2016-08-19 ENCOUNTER — Encounter: Payer: Self-pay | Admitting: Hematology and Oncology

## 2016-08-19 ENCOUNTER — Ambulatory Visit (HOSPITAL_BASED_OUTPATIENT_CLINIC_OR_DEPARTMENT_OTHER): Payer: Medicare Other

## 2016-08-19 VITALS — BP 137/78 | HR 78

## 2016-08-19 DIAGNOSIS — C50411 Malignant neoplasm of upper-outer quadrant of right female breast: Secondary | ICD-10-CM

## 2016-08-19 DIAGNOSIS — Z5112 Encounter for antineoplastic immunotherapy: Secondary | ICD-10-CM | POA: Diagnosis not present

## 2016-08-19 DIAGNOSIS — C50911 Malignant neoplasm of unspecified site of right female breast: Secondary | ICD-10-CM

## 2016-08-19 DIAGNOSIS — J910 Malignant pleural effusion: Secondary | ICD-10-CM | POA: Diagnosis not present

## 2016-08-19 DIAGNOSIS — C782 Secondary malignant neoplasm of pleura: Secondary | ICD-10-CM

## 2016-08-19 DIAGNOSIS — Z170 Estrogen receptor positive status [ER+]: Secondary | ICD-10-CM | POA: Diagnosis not present

## 2016-08-19 DIAGNOSIS — C7951 Secondary malignant neoplasm of bone: Secondary | ICD-10-CM

## 2016-08-19 DIAGNOSIS — C50112 Malignant neoplasm of central portion of left female breast: Secondary | ICD-10-CM

## 2016-08-19 LAB — CBC WITH DIFFERENTIAL/PLATELET
BASO%: 0.9 % (ref 0.0–2.0)
Basophils Absolute: 0.1 10*3/uL (ref 0.0–0.1)
EOS%: 6.3 % (ref 0.0–7.0)
Eosinophils Absolute: 0.4 10*3/uL (ref 0.0–0.5)
HCT: 37.1 % (ref 34.8–46.6)
HGB: 11.9 g/dL (ref 11.6–15.9)
LYMPH%: 13.2 % — ABNORMAL LOW (ref 14.0–49.7)
MCH: 26 pg (ref 25.1–34.0)
MCHC: 32.1 g/dL (ref 31.5–36.0)
MCV: 81.2 fL (ref 79.5–101.0)
MONO#: 0.5 10*3/uL (ref 0.1–0.9)
MONO%: 8.2 % (ref 0.0–14.0)
NEUT#: 4 10*3/uL (ref 1.5–6.5)
NEUT%: 71.4 % (ref 38.4–76.8)
Platelets: 203 10*3/uL (ref 145–400)
RBC: 4.57 10*6/uL (ref 3.70–5.45)
RDW: 14.3 % (ref 11.2–14.5)
WBC: 5.6 10*3/uL (ref 3.9–10.3)
lymph#: 0.7 10*3/uL — ABNORMAL LOW (ref 0.9–3.3)
nRBC: 0 % (ref 0–0)

## 2016-08-19 LAB — COMPREHENSIVE METABOLIC PANEL
ALT: 29 U/L (ref 0–55)
AST: 28 U/L (ref 5–34)
Albumin: 3.7 g/dL (ref 3.5–5.0)
Alkaline Phosphatase: 100 U/L (ref 40–150)
Anion Gap: 7 mEq/L (ref 3–11)
BUN: 12.5 mg/dL (ref 7.0–26.0)
CO2: 27 mEq/L (ref 22–29)
Calcium: 10 mg/dL (ref 8.4–10.4)
Chloride: 104 mEq/L (ref 98–109)
Creatinine: 0.8 mg/dL (ref 0.6–1.1)
EGFR: 79 mL/min/{1.73_m2} — ABNORMAL LOW (ref >90)
Glucose: 98 mg/dl (ref 70–140)
Potassium: 4 mEq/L (ref 3.5–5.1)
Sodium: 137 mEq/L (ref 136–145)
Total Bilirubin: 0.46 mg/dL (ref 0.20–1.20)
Total Protein: 7.2 g/dL (ref 6.4–8.3)

## 2016-08-19 MED ORDER — DIPHENHYDRAMINE HCL 25 MG PO CAPS
ORAL_CAPSULE | ORAL | Status: AC
  Filled 2016-08-19: qty 2

## 2016-08-19 MED ORDER — AMOXICILLIN 500 MG PO TABS: 500.0000 mg | ORAL_TABLET | Freq: Two times a day (BID) | ORAL | 0 refills | Status: DC

## 2016-08-19 MED ORDER — SODIUM CHLORIDE 0.9 % IV SOLN
Freq: Once | INTRAVENOUS | Status: AC
  Administered 2016-08-19: 12:00:00 via INTRAVENOUS

## 2016-08-19 MED ORDER — ZOLEDRONIC ACID 4 MG/100ML IV SOLN
4.0000 mg | Freq: Once | INTRAVENOUS | Status: AC
  Administered 2016-08-19: 4 mg via INTRAVENOUS
  Filled 2016-08-19: qty 100

## 2016-08-19 MED ORDER — SODIUM CHLORIDE 0.9 % IJ SOLN
10.0000 mL | INTRAMUSCULAR | Status: DC | PRN
  Administered 2016-08-19: 10 mL via INTRAVENOUS
  Filled 2016-08-19: qty 10

## 2016-08-19 MED ORDER — DIPHENHYDRAMINE HCL 25 MG PO CAPS
50.0000 mg | ORAL_CAPSULE | Freq: Once | ORAL | Status: AC
  Administered 2016-08-19: 50 mg via ORAL

## 2016-08-19 MED ORDER — SODIUM CHLORIDE 0.9% FLUSH
10.0000 mL | INTRAVENOUS | Status: DC | PRN
  Administered 2016-08-19: 10 mL
  Filled 2016-08-19: qty 10

## 2016-08-19 MED ORDER — FULVESTRANT 250 MG/5ML IM SOLN
500.0000 mg | Freq: Once | INTRAMUSCULAR | Status: AC
  Administered 2016-08-19: 500 mg via INTRAMUSCULAR
  Filled 2016-08-19: qty 10

## 2016-08-19 MED ORDER — HEPARIN SOD (PORK) LOCK FLUSH 100 UNIT/ML IV SOLN
500.0000 [IU] | Freq: Once | INTRAVENOUS | Status: AC | PRN
  Administered 2016-08-19: 500 [IU]
  Filled 2016-08-19: qty 5

## 2016-08-19 MED ORDER — SODIUM CHLORIDE 0.9 % IV SOLN
420.0000 mg | Freq: Once | INTRAVENOUS | Status: AC
  Administered 2016-08-19: 420 mg via INTRAVENOUS
  Filled 2016-08-19: qty 14

## 2016-08-19 MED ORDER — ACETAMINOPHEN 325 MG PO TABS
650.0000 mg | ORAL_TABLET | Freq: Once | ORAL | Status: AC
  Administered 2016-08-19: 650 mg via ORAL

## 2016-08-19 MED ORDER — TRASTUZUMAB CHEMO 150 MG IV SOLR
6.0000 mg/kg | Freq: Once | INTRAVENOUS | Status: AC
  Administered 2016-08-19: 378 mg via INTRAVENOUS
  Filled 2016-08-19: qty 18

## 2016-08-19 MED ORDER — ACETAMINOPHEN 325 MG PO TABS
ORAL_TABLET | ORAL | Status: AC
  Filled 2016-08-19: qty 2

## 2016-08-19 NOTE — Progress Notes (Signed)
Patient Care Team: [name], MD as PCP - General (Family Medicine)  DIAGNOSIS:  Encounter Diagnosis  Name Primary?  . Primary cancer of upper outer quadrant of right female breast ([place])     SUMMARY OF ONCOLOGIC HISTORY:   Primary cancer of upper outer quadrant of right female breast ([place])   03/08/2012 Initial Diagnosis    Right breast invasive ductal carcinoma ER positive PR negative HER-2 positive Ki-67 44%; another breast mass biopsied in the anterior part of the breast which was also positive for malignancy that was HER-2 negative      05/10/2012 Surgery    Right mastectomy and axillary lymph node dissection: Multifocal disease 5 cm, 1.7 cm, 1.6 cm, ER positive PR negative HER-2 positive Ki-67 44%, 3/7 lymph nodes positive      06/14/2012 - 06/12/2013 Chemotherapy    Adjuvant chemotherapy with [place] 6 followed by Herceptin maintenance      10/25/2012 - 12/11/2012 Radiation Therapy    Adjuvant radiation therapy      02/07/2013 -  Anti-estrogen oral therapy    Letrozole 2.5 mg daily      02/14/2015 Imaging    MRI spine: Large destructive T5 lesion with severe pathologic compression fracture and extensive epidural tumor, severe spinal stenosis with moderate cord compression, tumor involvement of T4      03/18/2015 PET scan    Residual enhancing soft tissue adjacent to the spinal cord. No evidence of metastatic disease. Nonspecific uptake the left nipple      03/27/2015 - 03/30/2015 Hospital Admission    T4-6 decompression for spinal cord compression (lower extremity paralysis)      04/10/2015 -  Chemotherapy    Palliative treatment with Herceptin every 3 weeks with letrozole 2.5 mg daily      04/13/2015 - 05/19/2015 Radiation Therapy    Palliative radiation treatment to the spine      09/01/2015 Imaging    CT chest abdomen pelvis: Pathologic fracture with posterior fusion at T5 no other evidence of metastatic disease in the chest abdomen pelvis      12/23/2015 Imaging      CT CAP: new nonspecific 0.6 cm lymph node was stated mediastinum needs follow-up CT, innumerable tiny groundglass pulmonary nodules throughout both lungs unchanged, patchy consolidation from radiation, T5 fracture, no mets      04/15/2016 PET scan    Rt para-spinal mass mildy hypermetabolic SUV 5.9, lytic cortical lesion 4.8 cm lesion left prox femur suv 8.8 favor osseus met disease      04/28/2016 - 05/12/2016 Radiation Therapy    Palliative XRT Left femur      05/24/2016 Procedure    Soft tissue mass biopsy back: Metastatic breast cancer ER 80%, PR 0%, Ki-67 50%, HER-2 positive ratio 2.25      06/09/2016 -  Anti-estrogen oral therapy    Faslodex with Herceptin and Perjeta every 4 weeks      06/13/2016 - 06/15/2016 Hospital Admission    uncomplicated revision of previous thoracic instrumentation.       CHIEF COMPLIANT: Follow-up on Faslodex, Herceptin and Perjeta  INTERVAL HISTORY: Heather [name] is a 71 year old with above-mentioned history of metastatic breast cancer currently on Faslodex with Herceptin and Perjeta. She is tolerating the treatment fairly well. She denies any diarrhea from Perjeta. Her back appears to be stable. She does not want to undergo MRI testing on the back. She has spoken with her neurosurgeon about this.  REVIEW OF SYSTEMS:   Constitutional: Denies fevers, chills or  abnormal weight loss Eyes: Denies blurriness of vision Ears, nose, mouth, throat, and face: Denies mucositis or sore throat Respiratory: Denies cough, dyspnea or wheezes Cardiovascular: Denies palpitation, chest discomfort Gastrointestinal:  Denies nausea, heartburn or change in bowel habits Skin: Denies abnormal skin rashes Lymphatics: Denies new lymphadenopathy or easy bruising Neurological:Denies numbness, tingling or new weaknesses Behavioral/Psych: Mood is stable, no new changes  Extremities: No lower extremity edema, able to walk with the help of a cane. All other systems were reviewed  with the patient and are negative.  I have reviewed the past medical history, past surgical history, social history and family history with the patient and they are unchanged from previous note.  ALLERGIES:  is allergic to other; acyclovir and related; codeine; keflex [cephalexin]; naprosyn [naproxen]; oxycodone; tramadol; and tussin [guaifenesin].  MEDICATIONS:  Current Outpatient Prescriptions  Medication Sig Dispense Refill  . amoxicillin (AMOXIL) 500 MG tablet Take 1 tablet (500 mg total) by mouth 2 (two) times daily. 14 tablet 0  . b complex vitamins tablet Take 1 tablet by mouth daily.    . Calcium Carbonate Antacid (TUMS PO) Take 2 tablets by mouth 2 (two) times daily as needed (acid reflux).    . Cholecalciferol (VITAMIN D) 2000 UNITS tablet Take 2,000 Units by mouth daily.    . Coenzyme Q10 (COQ10) 100 MG CAPS Take 300 mg by mouth at bedtime.     . gabapentin (NEURONTIN) 600 MG tablet Take 1 tablet (600 mg total) by mouth as directed. Take 1 tablet in the AM and afternoon and 1 1/2 tabs at bedtime 315 tablet 2  . HYDROcodone-acetaminophen (NORCO/VICODIN) 5-325 MG tablet Take 1-2 tablets by mouth every 4 (four) hours as needed (mild pain). (Patient not taking: Reported on 07/05/2016) 60 tablet 0  . lidocaine-prilocaine (EMLA) cream Apply to affected area once (Patient taking differently: Apply 1 application topically every 21 ( twenty-one) days. Apply to port prior to infusions) 30 g 3  . OVER THE COUNTER MEDICATION Place 1 drop into both eyes 2 (two) times daily as needed (dry eyes). Over the counter lubricating eye drops    . polyethylene glycol (MIRALAX / GLYCOLAX) packet Take 17 g by mouth 2 (two) times daily. (Patient taking differently: Take 17 g by mouth daily as needed for moderate constipation. ) 100 each 0  . pravastatin (PRAVACHOL) 10 MG tablet Take 10 mg by mouth every Monday, Wednesday, and Friday.     . Probiotic Product (PROBIOTIC DAILY PO) Take 1 capsule by mouth daily.       . prochlorperazine (COMPAZINE) 10 MG tablet Take 10 mg by mouth every 6 (six) hours as needed for nausea or vomiting.    . ranitidine (ZANTAC) 300 MG tablet Take 300 mg by mouth at bedtime as needed. Reported on 06/25/2015    . spironolactone (ALDACTONE) 25 MG tablet Take 0.5 tablets (12.5 mg total) by mouth daily. (Patient not taking: Reported on 07/05/2016) 45 tablet 12   No current facility-administered medications for this visit.     PHYSICAL EXAMINATION: ECOG PERFORMANCE STATUS: 1 - Symptomatic but completely ambulatory  Vitals:   08/19/16 1024  BP: (!) 168/83  Pulse: (!) 103  Resp: 18  Temp: 98.2 F (36.8 C)   Filed Weights   08/19/16 1024  Weight: 139 lb 9.6 oz (63.3 kg)    GENERAL:alert, no distress and comfortable SKIN: skin color, texture, turgor are normal, no rashes or significant lesions EYES: normal, Conjunctiva are pink and non-injected, sclera clear OROPHARYNX:no exudate,  no erythema and lips, buccal mucosa, and tongue normal  NECK: supple, thyroid normal size, non-tender, without nodularity LYMPH:  no palpable lymphadenopathy in the cervical, axillary or inguinal LUNGS: clear to auscultation and percussion with normal breathing effort HEART: regular rate & rhythm and no murmurs and no lower extremity edema ABDOMEN:abdomen soft, non-tender and normal bowel sounds MUSCULOSKELETAL:no cyanosis of digits and no clubbing  NEURO: alert & oriented x 3 with fluent speech, no focal motor/sensory deficits EXTREMITIES: No lower extremity edema  LABORATORY DATA:  I have reviewed the data as listed   Chemistry      Component Value Date/Time   NA 137 08/19/2016 0934   K 4.0 08/19/2016 0934   CL 103 06/14/2016 0413   CL 101 10/25/2012 0946   CO2 27 08/19/2016 0934   BUN 12.5 08/19/2016 0934   CREATININE 0.8 08/19/2016 0934      Component Value Date/Time   CALCIUM 10.0 08/19/2016 0934   ALKPHOS 100 08/19/2016 0934   AST 28 08/19/2016 0934   ALT 29 08/19/2016 0934    BILITOT 0.46 08/19/2016 0934       Lab Results  Component Value Date   WBC 5.6 08/19/2016   HGB 11.9 08/19/2016   HCT 37.1 08/19/2016   MCV 81.2 08/19/2016   PLT 203 08/19/2016   NEUTROABS 4.0 08/19/2016    ASSESSMENT & PLAN:  Primary cancer of upper outer quadrant of right female breast ([place]) Right breast invasive ductal carcinoma ER positive PR negative HER-2 positive Ki-67 44% multifocal disease 3/7 lymph nodes positive T2 N1 M0 stage IIB status post adjuvant chemotherapy with TCH followed by Herceptin maintenance, adjuvant radiation therapy and Letrozole. MRI 02/14/2015: T5 large destructive lesion with pathologic compression fracture and extensive epidural tumor, involvement of T4, excision metastatic carcinoma ER 60%, PR 0%, HER-2 positive ratio 2.71 average copy #7.45 03/27/2015: T4-6 decompression surgery for spinal cord compression PET-CT 12/10/17Rt para-spinal mass mildy hypermetabolic SUV 5.9, lytic cortical lesion 4.8 cm lesion left prox femur suv 8.8 favor osseus met disease   Treatment summary: 1. Resumed anastrozole 05/21/2015, but it has caused significant hair loss so we switched her to exemestane 25 mg once daily 07/23/2015 2. Herceptin every 3 week started 04/10/2015 3. Bone metastases: Zometa every 3 months. 4. Radiation therapy to the spine (Dr. [name]) 04/13/2015 to 05/19/2015 5. Radiation therapy to left femur 04/28/2016 to 05/12/2016  -------------------------------------------------------------------------------------------------------------------------- Goal of treatment: Palliation Zometa toxicities: bone pain and fatigue. If it causes persistent symptoms, Zometa every 3 months.  Biopsy of the subcutaneous nodule 05/16/16:Metastatic breast cancer ER 80%, PR 0%, HER-2 positive ratio 2.25, Ki-67 50%  S/P revision of back spine instrumentation 06/13/16  Treatment plan:Faslodex along with Herceptin and Perjeta first dose of Faslodex given 06/09/2016  every 4 weeks  MRI spine 07/13/2016: Stable (comment was made about a new right pleural metastases with chest wall invasion and small right malignant pleural effusion) CT chest 07/19/2016: Small right pleural effusion, no significant change in this soft tissue density in the right posterior medial pleural space, highly suspicious for pleural metastatic disease slight increase in mediastinal lymphadenopathy  We will plan on obtaining a PET/CT scan in June and follow-up after that. She will see Mendel Ryder next month with her treatment.  I spent 25 minutes talking to the patient of which more than half was spent in counseling and coordination of care.  Orders Placed This Encounter  Procedures  . NM PET Image Restag (PS) Skull Base To Thigh    Standing  Status:   Future    Standing Expiration Date:   08/19/2017    Order Specific Question:   Reason for Exam (SYMPTOM  OR DIAGNOSIS REQUIRED)    Answer:   Metastatic breast cancer restaging    Order Specific Question:   If indicated for the ordered procedure, I authorize the administration of a radiopharmaceutical per Radiology protocol    Answer:   Yes    Order Specific Question:   Preferred imaging location?    Answer:   [facility]    Order Specific Question:   Radiology Contrast Protocol - do NOT remove file path    Answer:   [email or web address]   The patient has a good understanding of the overall plan. she agrees with it. she will call with any problems that may develop before the next visit here.   Rulon Eisenmenger, MD 08/19/16

## 2016-08-19 NOTE — Assessment & Plan Note (Signed)
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 MRI 02/14/2015: T5 large destructive lesion with pathologic compression fracture and extensive epidural tumor, involvement of T4, excision metastatic carcinoma ER 60%, PR 0%, HER-2 positive ratio 2.71 average copy #7.45 03/27/2015: T4-6 decompression surgery for spinal cord compression PET-CT 12/10/17Rt para-spinal mass mildy hypermetabolic SUV 5.9, lytic cortical lesion 4.8 cm lesion left prox femur suv 8.8 favor osseus met disease   Treatment summary: 1. Resumed anastrozole 05/21/2015, but it has caused significant hair loss so we switched her to exemestane 25 mg once daily 07/23/2015 2. Herceptin every 3 week started 04/10/2015 3. Bone metastases: Zometa every 3 months. 4. Radiation therapy to the spine (Dr. [name]) 04/13/2015 to 05/19/2015 5. Radiation therapy to left femur 04/28/2016 to 05/12/2016  -------------------------------------------------------------------------------------------------------------------------- Goal of treatment: Palliation Zometa toxicities: bone pain and fatigue. If it causes persistent symptoms, Zometa every 3 months.  Biopsy of the subcutaneous nodule 05/16/16:Metastatic breast cancer ER 80%, PR 0%, HER-2 positive ratio 2.25, Ki-67 50%  S/P revision of back spine instrumentation 06/13/16  Treatment plan:Faslodex along with Herceptin and Perjeta first dose of Faslodex given 06/09/2016  MRI spine 07/13/2016: Stable (comment was made about a new right pleural metastases with chest wall invasion and small right malignant pleural effusion) CT chest 07/19/2016: Small right pleural effusion, no significant change in this soft tissue density in the right posterior medial pleural space, highly  suspicious for pleural metastatic disease slight increase in mediastinal lymphadenopathy  We will plan on obtaining a PET/CT scan in June and follow-up after that.

## 2016-08-19 NOTE — Patient Instructions (Addendum)
[facility] Discharge Instructions for Patients Receiving Chemotherapy  Today you received the following chemotherapy agents:  Herceptin (trastuzumab), Perjeta (pertuzumab)  To help prevent nausea and vomiting after your treatment, we encourage you to take your nausea medication as prescribed.   If you develop nausea and vomiting that is not controlled by your nausea medication, call the clinic.   BELOW ARE SYMPTOMS THAT SHOULD BE REPORTED IMMEDIATELY:  *FEVER GREATER THAN 100.5 F  *CHILLS WITH OR WITHOUT FEVER  NAUSEA AND VOMITING THAT IS NOT CONTROLLED WITH YOUR NAUSEA MEDICATION  *UNUSUAL SHORTNESS OF BREATH  *UNUSUAL BRUISING OR BLEEDING  TENDERNESS IN MOUTH AND THROAT WITH OR WITHOUT PRESENCE OF ULCERS  *URINARY PROBLEMS  *BOWEL PROBLEMS  UNUSUAL RASH Items with * indicate a potential emergency and should be followed up as soon as possible.  Feel free to call the clinic you have any questions or concerns. The clinic phone number is [phone].  Please show the [facility] at check-in to the Emergency Department and triage nurse.      Trastuzumab injection for infusion What is this medicine? TRASTUZUMAB (tras TOO zoo mab) is a monoclonal antibody. It is used to treat breast cancer and stomach cancer. This medicine may be used for other purposes; ask your health care provider or pharmacist if you have questions. COMMON BRAND NAME(S): Herceptin What should I tell my health care provider before I take this medicine? They need to know if you have any of these conditions: -heart disease -heart failure -lung or breathing disease, like asthma -an unusual or allergic reaction to trastuzumab, benzyl alcohol, or other medications, foods, dyes, or preservatives -pregnant or trying to get pregnant -breast-feeding How should I use this medicine? This drug is given as an infusion into a vein. It is administered in a hospital or clinic by a specially  trained health care professional. Talk to your pediatrician regarding the use of this medicine in children. This medicine is not approved for use in children. Overdosage: If you think you have taken too much of this medicine contact a poison control center or emergency room at once. NOTE: This medicine is only for you. Do not share this medicine with others. What if I miss a dose? It is important not to miss a dose. Call your doctor or health care professional if you are unable to keep an appointment. What may interact with this medicine? This medicine may interact with the following medications: -certain types of chemotherapy, such as daunorubicin, doxorubicin, epirubicin, and idarubicin This list may not describe all possible interactions. Give your health care provider a list of all the medicines, herbs, non-prescription drugs, or dietary supplements you use. Also tell them if you smoke, drink alcohol, or use illegal drugs. Some items may interact with your medicine. What should I watch for while using this medicine? Visit your doctor for checks on your progress. Report any side effects. Continue your course of treatment even though you feel ill unless your doctor tells you to stop. Call your doctor or health care professional for advice if you get a fever, chills or sore throat, or other symptoms of a cold or flu. Do not treat yourself. Try to avoid being around people who are sick. You may experience fever, chills and shaking during your first infusion. These effects are usually mild and can be treated with other medicines. Report any side effects during the infusion to your health care professional. Fever and chills usually do not happen with  later infusions. Do not become pregnant while taking this medicine or for 7 months after stopping it. Women should inform their doctor if they wish to become pregnant or think they might be pregnant. Women of child-bearing potential will need to have a  negative pregnancy test before starting this medicine. There is a potential for serious side effects to an unborn child. Talk to your health care professional or pharmacist for more information. Do not breast-feed an infant while taking this medicine or for 7 months after stopping it. Women must use effective birth control with this medicine. What side effects may I notice from receiving this medicine? Side effects that you should report to your doctor or health care professional as soon as possible: -allergic reactions like skin rash, itching or hives, swelling of the face, lips, or tongue -chest pain or palpitations -cough -dizziness -feeling faint or lightheaded, falls -fever -general ill feeling or flu-like symptoms -signs of worsening heart failure like breathing problems; swelling in your legs and feet -unusually weak or tired Side effects that usually do not require medical attention (report to your doctor or health care professional if they continue or are bothersome): -bone pain -changes in taste -diarrhea -joint pain -nausea/vomiting -weight loss This list may not describe all possible side effects. Call your doctor for medical advice about side effects. You may report side effects to FDA at 1-800-FDA-1088. Where should I keep my medicine? This drug is given in a hospital or clinic and will not be stored at home. NOTE: This sheet is a summary. It may not cover all possible information. If you have questions about this medicine, talk to your doctor, pharmacist, or health care provider.  2018 Elsevier/Gold Standard (2016-04-19 14:37:52)    Pertuzumab injection What is this medicine? PERTUZUMAB (per TOOZ ue mab) is a monoclonal antibody. It is used to treat breast cancer. This medicine may be used for other purposes; ask your health care provider or pharmacist if you have questions. COMMON BRAND NAME(S): PERJETA What should I tell my health care provider before I take this  medicine? They need to know if you have any of these conditions: -heart disease -heart failure -high blood pressure -history of irregular heart beat -recent or ongoing radiation therapy -an unusual or allergic reaction to pertuzumab, other medicines, foods, dyes, or preservatives -pregnant or trying to get pregnant -breast-feeding How should I use this medicine? This medicine is for infusion into a vein. It is given by a health care professional in a hospital or clinic setting. Talk to your pediatrician regarding the use of this medicine in children. Special care may be needed. Overdosage: If you think you have taken too much of this medicine contact a poison control center or emergency room at once. NOTE: This medicine is only for you. Do not share this medicine with others. What if I miss a dose? It is important not to miss your dose. Call your doctor or health care professional if you are unable to keep an appointment. What may interact with this medicine? Interactions are not expected. Give your health care provider a list of all the medicines, herbs, non-prescription drugs, or dietary supplements you use. Also tell them if you smoke, drink alcohol, or use illegal drugs. Some items may interact with your medicine. This list may not describe all possible interactions. Give your health care provider a list of all the medicines, herbs, non-prescription drugs, or dietary supplements you use. Also tell them if you smoke, drink alcohol, or use  illegal drugs. Some items may interact with your medicine. What should I watch for while using this medicine? Your condition will be monitored carefully while you are receiving this medicine. Report any side effects. Continue your course of treatment even though you feel ill unless your doctor tells you to stop. Do not become pregnant while taking this medicine or for 7 months after stopping it. Women should inform their doctor if they wish to become  pregnant or think they might be pregnant. Women of child-bearing potential will need to have a negative pregnancy test before starting this medicine. There is a potential for serious side effects to an unborn child. Talk to your health care professional or pharmacist for more information. Do not breast-feed an infant while taking this medicine or for 7 months after stopping it. Women must use effective birth control with this medicine. Call your doctor or health care professional for advice if you get a fever, chills or sore throat, or other symptoms of a cold or flu. Do not treat yourself. Try to avoid being around people who are sick. You may experience fever, chills, and headache during the infusion. Report any side effects during the infusion to your health care professional. What side effects may I notice from receiving this medicine? Side effects that you should report to your doctor or health care professional as soon as possible: -breathing problems -chest pain or palpitations -dizziness -feeling faint or lightheaded -fever or chills -skin rash, itching or hives -sore throat -swelling of the face, lips, or tongue -swelling of the legs or ankles -unusually weak or tired Side effects that usually do not require medical attention (report to your doctor or health care professional if they continue or are bothersome): -diarrhea -hair loss -nausea, vomiting -tiredness This list may not describe all possible side effects. Call your doctor for medical advice about side effects. You may report side effects to FDA at 1-800-FDA-1088. Where should I keep my medicine? This drug is given in a hospital or clinic and will not be stored at home. NOTE: This sheet is a summary. It may not cover all possible information. If you have questions about this medicine, talk to your doctor, pharmacist, or health care provider.  2018 Elsevier/Gold Standard (2015-05-28 12:08:50)  Fulvestrant injection What  is this medicine? FULVESTRANT (ful VES trant) blocks the effects of estrogen. It is used to treat breast cancer. This medicine may be used for other purposes; ask your health care provider or pharmacist if you have questions. COMMON BRAND NAME(S): FASLODEX What should I tell my health care provider before I take this medicine? They need to know if you have any of these conditions: -bleeding problems -liver disease -low levels of platelets in the blood -an unusual or allergic reaction to fulvestrant, other medicines, foods, dyes, or preservatives -pregnant or trying to get pregnant -breast-feeding How should I use this medicine? This medicine is for injection into a muscle. It is usually given by a health care professional in a hospital or clinic setting. Talk to your pediatrician regarding the use of this medicine in children. Special care may be needed. Overdosage: If you think you have taken too much of this medicine contact a poison control center or emergency room at once. NOTE: This medicine is only for you. Do not share this medicine with others. What if I miss a dose? It is important not to miss your dose. Call your doctor or health care professional if you are unable to keep an appointment.  What may interact with this medicine? -medicines that treat or prevent blood clots like warfarin, enoxaparin, and dalteparin This list may not describe all possible interactions. Give your health care provider a list of all the medicines, herbs, non-prescription drugs, or dietary supplements you use. Also tell them if you smoke, drink alcohol, or use illegal drugs. Some items may interact with your medicine. What should I watch for while using this medicine? Your condition will be monitored carefully while you are receiving this medicine. You will need important blood work done while you are taking this medicine. Do not become pregnant while taking this medicine or for at least 1 year after  stopping it. Women of child-bearing potential will need to have a negative pregnancy test before starting this medicine. Women should inform their doctor if they wish to become pregnant or think they might be pregnant. There is a potential for serious side effects to an unborn child. Men should inform their doctors if they wish to father a child. This medicine may lower sperm counts. Talk to your health care professional or pharmacist for more information. Do not breast-feed an infant while taking this medicine or for 1 year after the last dose. What side effects may I notice from receiving this medicine? Side effects that you should report to your doctor or health care professional as soon as possible: -allergic reactions like skin rash, itching or hives, swelling of the face, lips, or tongue -feeling faint or lightheaded, falls -pain, tingling, numbness, or weakness in the legs -signs and symptoms of infection like fever or chills; cough; flu-like symptoms; sore throat -vaginal bleeding Side effects that usually do not require medical attention (report to your doctor or health care professional if they continue or are bothersome): -aches, pains -constipation -diarrhea -headache -hot flashes -nausea, vomiting -pain at site where injected -stomach pain This list may not describe all possible side effects. Call your doctor for medical advice about side effects. You may report side effects to FDA at 1-800-FDA-1088. Where should I keep my medicine? This drug is given in a hospital or clinic and will not be stored at home. NOTE: This sheet is a summary. It may not cover all possible information. If you have questions about this medicine, talk to your doctor, pharmacist, or health care provider.  2018 Elsevier/Gold Standard (2014-11-21 11:03:55)

## 2016-08-19 NOTE — Patient Instructions (Signed)

## 2016-08-23 ENCOUNTER — Encounter (HOSPITAL_COMMUNITY): Payer: Self-pay

## 2016-08-23 DIAGNOSIS — M940 Chondrocostal junction syndrome [Tietze]: Secondary | ICD-10-CM | POA: Diagnosis not present

## 2016-08-23 DIAGNOSIS — M5414 Radiculopathy, thoracic region: Secondary | ICD-10-CM | POA: Diagnosis not present

## 2016-08-23 DIAGNOSIS — T84498A Other mechanical complication of other internal orthopedic devices, implants and grafts, initial encounter: Secondary | ICD-10-CM | POA: Diagnosis not present

## 2016-08-23 DIAGNOSIS — I10 Essential (primary) hypertension: Secondary | ICD-10-CM | POA: Diagnosis not present

## 2016-09-13 DIAGNOSIS — M5414 Radiculopathy, thoracic region: Secondary | ICD-10-CM | POA: Diagnosis not present

## 2016-09-15 ENCOUNTER — Other Ambulatory Visit (HOSPITAL_BASED_OUTPATIENT_CLINIC_OR_DEPARTMENT_OTHER): Payer: Medicare Other

## 2016-09-15 ENCOUNTER — Ambulatory Visit: Payer: Medicare Other

## 2016-09-15 ENCOUNTER — Encounter: Payer: Self-pay | Admitting: Adult Health

## 2016-09-15 ENCOUNTER — Encounter: Payer: Self-pay | Admitting: *Deleted

## 2016-09-15 ENCOUNTER — Ambulatory Visit (HOSPITAL_BASED_OUTPATIENT_CLINIC_OR_DEPARTMENT_OTHER): Payer: Medicare Other | Admitting: Adult Health

## 2016-09-15 ENCOUNTER — Ambulatory Visit (HOSPITAL_BASED_OUTPATIENT_CLINIC_OR_DEPARTMENT_OTHER): Payer: Medicare Other

## 2016-09-15 VITALS — BP 142/70 | HR 68 | Temp 98.0°F | Resp 18 | Ht 63.5 in | Wt 137.2 lb

## 2016-09-15 DIAGNOSIS — Z170 Estrogen receptor positive status [ER+]: Secondary | ICD-10-CM | POA: Diagnosis not present

## 2016-09-15 DIAGNOSIS — C773 Secondary and unspecified malignant neoplasm of axilla and upper limb lymph nodes: Secondary | ICD-10-CM

## 2016-09-15 DIAGNOSIS — C50411 Malignant neoplasm of upper-outer quadrant of right female breast: Secondary | ICD-10-CM | POA: Diagnosis not present

## 2016-09-15 DIAGNOSIS — C50412 Malignant neoplasm of upper-outer quadrant of left female breast: Secondary | ICD-10-CM

## 2016-09-15 DIAGNOSIS — C7951 Secondary malignant neoplasm of bone: Secondary | ICD-10-CM

## 2016-09-15 DIAGNOSIS — C50112 Malignant neoplasm of central portion of left female breast: Secondary | ICD-10-CM

## 2016-09-15 DIAGNOSIS — Z5111 Encounter for antineoplastic chemotherapy: Secondary | ICD-10-CM | POA: Diagnosis not present

## 2016-09-15 DIAGNOSIS — Z5112 Encounter for antineoplastic immunotherapy: Secondary | ICD-10-CM

## 2016-09-15 DIAGNOSIS — C782 Secondary malignant neoplasm of pleura: Secondary | ICD-10-CM | POA: Diagnosis not present

## 2016-09-15 DIAGNOSIS — C50911 Malignant neoplasm of unspecified site of right female breast: Secondary | ICD-10-CM

## 2016-09-15 LAB — CBC WITH DIFFERENTIAL/PLATELET
BASO%: 0.2 % (ref 0.0–2.0)
Basophils Absolute: 0 10*3/uL (ref 0.0–0.1)
EOS%: 0.1 % (ref 0.0–7.0)
Eosinophils Absolute: 0 10*3/uL (ref 0.0–0.5)
HCT: 35 % (ref 34.8–46.6)
HGB: 11.2 g/dL — ABNORMAL LOW (ref 11.6–15.9)
LYMPH%: 14.7 % (ref 14.0–49.7)
MCH: 25.1 pg (ref 25.1–34.0)
MCHC: 32.1 g/dL (ref 31.5–36.0)
MCV: 78.1 fL — ABNORMAL LOW (ref 79.5–101.0)
MONO#: 0.6 10*3/uL (ref 0.1–0.9)
MONO%: 9.9 % (ref 0.0–14.0)
NEUT#: 4.8 10*3/uL (ref 1.5–6.5)
NEUT%: 75.1 % (ref 38.4–76.8)
Platelets: 231 10*3/uL (ref 145–400)
RBC: 4.48 10*6/uL (ref 3.70–5.45)
RDW: 15.9 % — ABNORMAL HIGH (ref 11.2–14.5)
WBC: 6.4 10*3/uL (ref 3.9–10.3)
lymph#: 0.9 10*3/uL (ref 0.9–3.3)

## 2016-09-15 LAB — COMPREHENSIVE METABOLIC PANEL
ALT: 22 U/L (ref 0–55)
AST: 23 U/L (ref 5–34)
Albumin: 3.9 g/dL (ref 3.5–5.0)
Alkaline Phosphatase: 78 U/L (ref 40–150)
Anion Gap: 8 mEq/L (ref 3–11)
BUN: 24.6 mg/dL (ref 7.0–26.0)
CO2: 27 mEq/L (ref 22–29)
Calcium: 9.3 mg/dL (ref 8.4–10.4)
Chloride: 103 mEq/L (ref 98–109)
Creatinine: 0.8 mg/dL (ref 0.6–1.1)
EGFR: 73 mL/min/{1.73_m2} — ABNORMAL LOW (ref >90)
Glucose: 72 mg/dl (ref 70–140)
Potassium: 3.9 mEq/L (ref 3.5–5.1)
Sodium: 138 mEq/L (ref 136–145)
Total Bilirubin: 0.41 mg/dL (ref 0.20–1.20)
Total Protein: 7.1 g/dL (ref 6.4–8.3)

## 2016-09-15 MED ORDER — TRASTUZUMAB CHEMO 150 MG IV SOLR
6.0000 mg/kg | Freq: Once | INTRAVENOUS | Status: AC
  Administered 2016-09-15: 378 mg via INTRAVENOUS
  Filled 2016-09-15: qty 18

## 2016-09-15 MED ORDER — ACETAMINOPHEN 325 MG PO TABS
650.0000 mg | ORAL_TABLET | Freq: Once | ORAL | Status: AC
  Administered 2016-09-15: 650 mg via ORAL

## 2016-09-15 MED ORDER — SODIUM CHLORIDE 0.9 % IV SOLN
Freq: Once | INTRAVENOUS | Status: AC
  Administered 2016-09-15: 12:00:00 via INTRAVENOUS

## 2016-09-15 MED ORDER — HEPARIN SOD (PORK) LOCK FLUSH 100 UNIT/ML IV SOLN
500.0000 [IU] | Freq: Once | INTRAVENOUS | Status: AC | PRN
  Administered 2016-09-15: 500 [IU] via INTRAVENOUS
  Filled 2016-09-15: qty 5

## 2016-09-15 MED ORDER — FULVESTRANT 250 MG/5ML IM SOLN
500.0000 mg | Freq: Once | INTRAMUSCULAR | Status: AC
  Administered 2016-09-15: 500 mg via INTRAMUSCULAR
  Filled 2016-09-15: qty 10

## 2016-09-15 MED ORDER — HEPARIN SOD (PORK) LOCK FLUSH 100 UNIT/ML IV SOLN
500.0000 [IU] | Freq: Once | INTRAVENOUS | Status: DC | PRN
  Filled 2016-09-15: qty 5

## 2016-09-15 MED ORDER — SODIUM CHLORIDE 0.9 % IV SOLN
420.0000 mg | Freq: Once | INTRAVENOUS | Status: AC
  Administered 2016-09-15: 420 mg via INTRAVENOUS
  Filled 2016-09-15: qty 14

## 2016-09-15 MED ORDER — DIPHENHYDRAMINE HCL 25 MG PO CAPS
ORAL_CAPSULE | ORAL | Status: AC
  Filled 2016-09-15: qty 1

## 2016-09-15 MED ORDER — SODIUM CHLORIDE 0.9 % IJ SOLN
10.0000 mL | INTRAMUSCULAR | Status: DC | PRN
  Administered 2016-09-15: 10 mL via INTRAVENOUS
  Filled 2016-09-15: qty 10

## 2016-09-15 MED ORDER — ALTEPLASE 2 MG IJ SOLR
2.0000 mg | Freq: Once | INTRAMUSCULAR | Status: DC | PRN
  Filled 2016-09-15: qty 2

## 2016-09-15 MED ORDER — SODIUM CHLORIDE 0.9% FLUSH
10.0000 mL | INTRAVENOUS | Status: DC | PRN
  Administered 2016-09-15: 10 mL
  Filled 2016-09-15: qty 10

## 2016-09-15 MED ORDER — ACETAMINOPHEN 325 MG PO TABS
ORAL_TABLET | ORAL | Status: AC
  Filled 2016-09-15: qty 2

## 2016-09-15 MED ORDER — DIPHENHYDRAMINE HCL 25 MG PO CAPS
50.0000 mg | ORAL_CAPSULE | Freq: Once | ORAL | Status: AC
  Administered 2016-09-15: 25 mg via ORAL

## 2016-09-15 NOTE — Patient Instructions (Addendum)
[facility] Discharge Instructions for Patients Receiving Chemotherapy  Today you received the following chemotherapy agents Herceptin/Perjeta To help prevent nausea and vomiting after your treatment, we encourage you to take your nausea medication as prescribed.   If you develop nausea and vomiting that is not controlled by your nausea medication, call the clinic.   BELOW ARE SYMPTOMS THAT SHOULD BE REPORTED IMMEDIATELY:  *FEVER GREATER THAN 100.5 F  *CHILLS WITH OR WITHOUT FEVER  NAUSEA AND VOMITING THAT IS NOT CONTROLLED WITH YOUR NAUSEA MEDICATION  *UNUSUAL SHORTNESS OF BREATH  *UNUSUAL BRUISING OR BLEEDING  TENDERNESS IN MOUTH AND THROAT WITH OR WITHOUT PRESENCE OF ULCERS  *URINARY PROBLEMS  *BOWEL PROBLEMS  UNUSUAL RASH Items with * indicate a potential emergency and should be followed up as soon as possible.  Feel free to call the clinic you have any questions or concerns. The clinic phone number is [phone].  Please show the [facility] at check-in to the Emergency Department and triage nurse.   Fulvestrant injection (Faslodex) What is this medicine? FULVESTRANT (ful VES trant) blocks the effects of estrogen. It is used to treat breast cancer. This medicine may be used for other purposes; ask your health care provider or pharmacist if you have questions. COMMON BRAND NAME(S): FASLODEX What should I tell my health care provider before I take this medicine? They need to know if you have any of these conditions: -bleeding problems -liver disease -low levels of platelets in the blood -an unusual or allergic reaction to fulvestrant, other medicines, foods, dyes, or preservatives -pregnant or trying to get pregnant -breast-feeding How should I use this medicine? This medicine is for injection into a muscle. It is usually given by a health care professional in a hospital or clinic setting. Talk to your pediatrician regarding the use of this  medicine in children. Special care may be needed. Overdosage: If you think you have taken too much of this medicine contact a poison control center or emergency room at once. NOTE: This medicine is only for you. Do not share this medicine with others. What if I miss a dose? It is important not to miss your dose. Call your doctor or health care professional if you are unable to keep an appointment. What may interact with this medicine? -medicines that treat or prevent blood clots like warfarin, enoxaparin, and dalteparin This list may not describe all possible interactions. Give your health care provider a list of all the medicines, herbs, non-prescription drugs, or dietary supplements you use. Also tell them if you smoke, drink alcohol, or use illegal drugs. Some items may interact with your medicine. What should I watch for while using this medicine? Your condition will be monitored carefully while you are receiving this medicine. You will need important blood work done while you are taking this medicine. Do not become pregnant while taking this medicine or for at least 1 year after stopping it. Women of child-bearing potential will need to have a negative pregnancy test before starting this medicine. Women should inform their doctor if they wish to become pregnant or think they might be pregnant. There is a potential for serious side effects to an unborn child. Men should inform their doctors if they wish to father a child. This medicine may lower sperm counts. Talk to your health care professional or pharmacist for more information. Do not breast-feed an infant while taking this medicine or for 1 year after the last dose. What side effects may I  notice from receiving this medicine? Side effects that you should report to your doctor or health care professional as soon as possible: -allergic reactions like skin rash, itching or hives, swelling of the face, lips, or tongue -feeling faint or  lightheaded, falls -pain, tingling, numbness, or weakness in the legs -signs and symptoms of infection like fever or chills; cough; flu-like symptoms; sore throat -vaginal bleeding Side effects that usually do not require medical attention (report to your doctor or health care professional if they continue or are bothersome): -aches, pains -constipation -diarrhea -headache -hot flashes -nausea, vomiting -pain at site where injected -stomach pain This list may not describe all possible side effects. Call your doctor for medical advice about side effects. You may report side effects to FDA at 1-800-FDA-1088. Where should I keep my medicine? This drug is given in a hospital or clinic and will not be stored at home. NOTE: This sheet is a summary. It may not cover all possible information. If you have questions about this medicine, talk to your doctor, pharmacist, or health care provider.  2018 Elsevier/Gold Standard (2014-11-21 11:03:55)

## 2016-09-15 NOTE — Assessment & Plan Note (Addendum)
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 MRI 02/14/2015: T5 large destructive lesion with pathologic compression fracture and extensive epidural tumor, involvement of T4, excision metastatic carcinoma ER 60%, PR 0%, HER-2 positive ratio 2.71 average copy #7.45 03/27/2015: T4-6 decompression surgery for spinal cord compression PET-CT 12/10/17Rt para-spinal mass mildy hypermetabolic SUV 5.9, lytic cortical lesion 4.8 cm lesion left prox femur suv 8.8 favor osseus met disease   Treatment summary: 1. Resumed anastrozole 05/21/2015, but it has caused significant hair loss so we switched her to exemestane 25 mg once daily 07/23/2015 2. Herceptin every 3 week started 04/10/2015 3. Bone metastases: Zometa every 3 months. 4. Radiation therapy to the spine (Dr. [name]) 04/13/2015 to 05/19/2015 5. Radiation therapy to left femur 04/28/2016 to 05/12/2016  -------------------------------------------------------------------------------------------------------------------------- Goal of treatment: Palliation Zometa toxicities: bone pain and fatigue. Zometa every 3 months.  Biopsy of the subcutaneous nodule 05/16/16:Metastatic breast cancer ER 80%, PR 0%, HER-2 positive ratio 2.25, Ki-67 50%  S/P revision of back spine instrumentation 06/13/16  Treatment plan:Faslodex along with Herceptin and Perjeta first dose of Faslodex given 06/09/2016  MRI spine 07/13/2016: Stable (comment was made about a new right pleural metastases with chest wall invasion and small right malignant pleural effusion) CT chest 07/19/2016: Small right pleural effusion, no significant change in this soft tissue density in the right posterior medial pleural space, highly suspicious for pleural metastatic  disease slight increase in mediastinal lymphadenopathy  Anxiety and Depression: We spent a long time today discussing her anxiety and depression and whether or not it could be related to the fulvestrant.  We discussed the prognosis of her disease and the fact that she is dealing with and going through a lot of stress with her illness, side effects, and prognosis.  We discussed different options such as meeting with a counselor, or starting an SSRI or SNRI.  We reviewed risks and benefits of both.  For now, we will refer to counseling and if worsens will consider adding a medication such as Effexor or Lexapro.  I will send a message to Vernie Shanks and Abby Potash to get this scheduled.    She will proceed with Herceptin and Perjeta today.  Next appt for cardio-onc evaluation is scheduled in June, 2018.  She is due for PET scan prior to next appt with [name], and I scheduled this in June as well.    Rebecka Apley will contact rehab for potential return for further rehab for her vertigo symptoms.  Should they worsen she will let us know.

## 2016-09-15 NOTE — Progress Notes (Signed)
Per Thedore Mins, NP schedule PET scan prior to patient visit with [name], MD. Appointment scheduled for 10/10/16 at 1100 am with instruction for patient not to eat/drink 6 hours prior to PET scan. Patient verbalized understanding.

## 2016-09-15 NOTE — Progress Notes (Signed)
Patient has Echo scheduled for June.   Wylene Simmer, BSN, RN 09/15/2016 11:26 AM    Patient only wants 25 mg Benadryl as she says 50 mg makes her too sleepy.

## 2016-09-15 NOTE — Progress Notes (Signed)
[name] Follow up:    [name], MD [place] [address]   DIAGNOSIS: Cancer Staging Primary cancer of upper outer quadrant of right female breast [facility]) Staging form: Breast, AJCC 7th Edition - Clinical: Stage IIB (T2, N1, cM0) - Unsigned Staging comments: Staged at breast conference 11.6.13  - Pathologic: No stage assigned - Unsigned   SUMMARY OF ONCOLOGIC HISTORY:   Primary cancer of upper outer quadrant of right female breast ([place])   03/08/2012 Initial Diagnosis    Right breast invasive ductal carcinoma ER positive PR negative HER-2 positive Ki-67 44%; another breast mass biopsied in the anterior part of the breast which was also positive for malignancy that was HER-2 negative      05/10/2012 Surgery    Right mastectomy and axillary lymph node dissection: Multifocal disease 5 cm, 1.7 cm, 1.6 cm, ER positive PR negative HER-2 positive Ki-67 44%, 3/7 lymph nodes positive      06/14/2012 - 06/12/2013 Chemotherapy    Adjuvant chemotherapy with Celina 6 followed by Herceptin maintenance      10/25/2012 - 12/11/2012 Radiation Therapy    Adjuvant radiation therapy      02/07/2013 -  Anti-estrogen oral therapy    Letrozole 2.5 mg daily      02/14/2015 Imaging    MRI spine: Large destructive T5 lesion with severe pathologic compression fracture and extensive epidural tumor, severe spinal stenosis with moderate cord compression, tumor involvement of T4      03/18/2015 PET scan    Residual enhancing soft tissue adjacent to the spinal cord. No evidence of metastatic disease. Nonspecific uptake the left nipple      03/27/2015 - 03/30/2015 Hospital Admission    T4-6 decompression for spinal cord compression (lower extremity paralysis)      04/10/2015 -  Chemotherapy    Palliative treatment with Herceptin every 3 weeks with letrozole 2.5 mg daily      04/13/2015 - 05/19/2015 Radiation Therapy    Palliative radiation treatment to  the spine      09/01/2015 Imaging    CT chest abdomen pelvis: Pathologic fracture with posterior fusion at T5 no other evidence of metastatic disease in the chest abdomen pelvis      12/23/2015 Imaging    CT CAP: new nonspecific 0.6 cm lymph node was stated mediastinum needs follow-up CT, innumerable tiny groundglass pulmonary nodules throughout both lungs unchanged, patchy consolidation from radiation, T5 fracture, no mets      04/15/2016 PET scan    Rt para-spinal mass mildy hypermetabolic SUV 5.9, lytic cortical lesion 4.8 cm lesion left prox femur suv 8.8 favor osseus met disease      04/28/2016 - 05/12/2016 Radiation Therapy    Palliative XRT Left femur      05/24/2016 Procedure    Soft tissue mass biopsy back: Metastatic breast cancer ER 80%, PR 0%, Ki-67 50%, HER-2 positive ratio 2.25      06/09/2016 -  Anti-estrogen oral therapy    Faslodex with Herceptin and Perjeta every 4 weeks      06/13/2016 - 06/15/2016 Hospital Admission    uncomplicated revision of previous thoracic instrumentation.       CURRENT THERAPY: Faslodex with Herceptin/Perjeta  INTERVAL HISTORY: Heather [name] 71 y.o. female returns for follow up and evaluation prior to Herceptin and Perjeta.  She has become more active recently as far as doing stuff around the house and she is enjoying it.  She is slightly  scared however, due to a sinking feeling of possible depression.  She is having a hard time hearing stories about people with cancer, or illnesses.  She is feeling more anxious than usual about her health and is concerned that she is getting more anxious and depressed.  She has had a couple of episodes recently of vertigo.  She is planning on calling the spine center and inquiring about possibly getting back into rehab for this.     Patient Active Problem List   Diagnosis Date Noted  . Genetic testing 07/11/2016  . ALLERGY TO CHLORHEXADINE, USE BETADINE ONLY 06/23/2016  . Kyphosis of thoracic region  06/13/2016  . Plantar fasciitis, bilateral 04/20/2016  . Neuropathic pain 01/27/2016  . Anemia of chronic disease 05/21/2015  . Metastatic cancer to spine ([place]) 03/27/2015  . Thrombocytopenia ([place]) 03/12/2015  . Prerenal azotemia 03/12/2015  . Pain   . Recurrent UTI--due to Klebsiella   . Paraplegia at T4 level ([place]) 02/20/2015  . Neurogenic bowel 02/20/2015  . Neurogenic bladder 02/20/2015  . Postoperative anemia due to acute blood loss 02/17/2015  . Spinal cord compression due to malignant neoplasm metastatic to spine ([place]) 02/15/2015  . Epidural mass 02/15/2015  . Thoracic spine tumor   . Pathologic fracture of vertebra 02/14/2015  . Pathologic fracture of thoracic vertebrae 02/14/2015  . Breast cancer metastasized to bone ([place]) 02/14/2015  . Hyperlipidemia 02/14/2015  . H. pylori infection   . HTN (hypertension) 04/17/2012  . Primary cancer of upper outer quadrant of right female breast ([place]) 03/08/2012    is allergic to other; acyclovir and related; codeine; keflex [cephalexin]; naprosyn [naproxen]; oxycodone; tramadol; and tussin [guaifenesin].  MEDICAL HISTORY: Past Medical History:  Diagnosis Date  . Anemia    hx of  . Anxiety    r/t updated surgery  . Breast cancer ([place])    right  . Cataract    immature-not sure of which eye  . Complication of anesthesia    pt states she is sensitive to meds  . Dizziness    has been going on for 38month;medical MD aware  . Genetic testing 07/11/2016   Test Results: Normal; No pathogenic mutations detected  Genes Analyzed: 43 genes on [employer]'s Common Cancers panel (APC, ATM, AXIN2, BARD1, BMPR1A, BRCA1, BRCA2, BRIP1, CDH1, CDKN2A, CHEK2, DICER1, EPCAM, GREM1, HOXB13, KIT, MEN1, MLH1, MSH2, MSH6, MUTYH, NBN, NF1, PALB2, PDGFRA, PMS2, POLD1, POLE, PTEN, RAD50, RAD51C, RAD51D, SDHA, SDHB, SDHC, SDHD, SMAD4, SMARCA4, STK11, TP53, TSC1, TSC2, VHL).  .Marland KitchenGERD (gastroesophageal reflux disease)    Tums prn  . H. pylori infection   . H/O  hiatal hernia   . Hemorrhoids   . History of UTI   . Hx of radiation therapy 10/25/12- 12/11/12   right chest wall/regional lymph nodes 5040 cGy, 28 sessions, right chest wall boost 1000 cGy 1 session  . Hx: UTI (urinary tract infection)   . Hyperlipidemia    but not on meds;diet and exercise controlled  . Hypertension    recently started Aldactone   . Insomnia    takes Melatoniin daily  . Metastasis to spinal column ([facility])    T5, Left  Femur cancer- radiation   . Neuropathy    "from back surgery"  . PONV (postoperative nausea and vomiting)    pt experienced hair loss, confusion and combative- 02/15/15  . Right knee pain   . Right shoulder pain   . Skin cancer    squamous, Nodule to back - "same cancer as breast."  .  Urinary frequency    d/t taking Aldactone    SURGICAL HISTORY: Past Surgical History:  Procedure Laterality Date  . COLONOSCOPY    . cyst removed from left breast  1970  . DECOMPRESSIVE LUMBAR LAMINECTOMY LEVEL 4 N/A 02/15/2015   Procedure: DECOMPRESSION T5 AND T3-T7 STABALIZATION;  Surgeon: Consuella Lose, MD;  Location: [facility];  Service: Neurosurgery;  Laterality: N/A;  . ESOPHAGOGASTRODUODENOSCOPY    . LAMINECTOMY N/A 03/27/2015   Procedure: Thoracic Four-Thoracic Six Laminectomy for tumor;  Surgeon: Consuella Lose, MD;  Location: [facility];  Service: Neurosurgery;  Laterality: N/A;  T4-T6 Laminectomy  . left wrist surgery   2004   with plate  . MASTECTOMY MODIFIED RADICAL  05/10/2012   Procedure: MASTECTOMY MODIFIED RADICAL;  Surgeon: Merrie Roof, MD;  Location: [facility];  Service: General;  Laterality: Right;  RIGHT MODIFIED RADICAL MASTECTOMY  . MASTECTOMY, RADICAL Right   . MINOR BREAST BIOPSY Left 05/16/2016   Procedure: EXCISION OF BACK NODULE;  Surgeon: Autumn Messing III, MD;  Location: [facility];  Service: General;  Laterality: Left;  . Nodule  back  05/17/2015  . PORTACATH PLACEMENT  05/10/2012   Procedure: INSERTION PORT-A-CATH;   Surgeon: Merrie Roof, MD;  Location: [facility];  Service: General;  Laterality: Left;    SOCIAL HISTORY: Social History   Social History  . Marital status: Married    Spouse name: N/A  . Number of children: 2  . Years of education: N/A   Occupational History  . Not on file.   Social History Main Topics  . Smoking status: Never Smoker  . Smokeless tobacco: Never Used  . Alcohol use No  . Drug use: No  . Sexual activity: Yes    Birth control/ protection: Post-menopausal   Other Topics Concern  . Not on file   Social History Narrative  . No narrative on file    FAMILY HISTORY: Family History  Problem Relation Age of Onset  . Leukemia Mother 59       deceased 45  . Stroke Father   . Diabetes Mellitus II Father   . Prostate cancer Brother 23       currently 28  . Stomach cancer Maternal Grandmother 32       deceased 80  . Prostate cancer Brother 51       currently 75    Review of Systems  Constitutional: Negative for appetite change, chills, diaphoresis, fatigue, fever and unexpected weight change.  HENT:   Negative for hearing loss, lump/mass and mouth sores.   Eyes: Negative for eye problems and icterus.  Respiratory: Negative for chest tightness, cough, shortness of breath and wheezing.   Cardiovascular: Negative for chest pain, leg swelling and palpitations.  Gastrointestinal: Negative for abdominal distention and abdominal pain.  Endocrine: Negative for hot flashes.  Genitourinary: Negative for difficulty urinating and dyspareunia.   Musculoskeletal: Negative for arthralgias.  Neurological: Negative for extremity weakness, headaches, light-headedness and numbness.  Hematological: Negative for adenopathy. Does not bruise/bleed easily.      PHYSICAL EXAMINATION  ECOG PERFORMANCE STATUS: 2 - Symptomatic, <50% confined to bed  Vitals:   09/15/16 0958  BP: (!) 142/70  Pulse: 68  Resp: 18  Temp: 98 F (36.7 C)    Physical Exam  Constitutional: She  is oriented to person, place, and time and well-developed, well-nourished, and in no distress.  HENT:  Head: Normocephalic and atraumatic.  Mouth/Throat: Oropharynx is clear and moist.  No oropharyngeal exudate.  Eyes: Pupils are equal, round, and reactive to light. No scleral icterus.  Neck: Neck supple.  Cardiovascular: Normal rate, regular rhythm and normal heart sounds.   Pulmonary/Chest: Effort normal and breath sounds normal.  Abdominal: Soft. Bowel sounds are normal. She exhibits no distension and no mass. There is no tenderness. There is no rebound and no guarding.  Musculoskeletal: She exhibits no edema.  Lymphadenopathy:    She has no cervical adenopathy.  Neurological: She is alert and oriented to person, place, and time.  Skin: No rash noted.  Psychiatric: Mood and affect normal.    LABORATORY DATA:  CBC    Component Value Date/Time   WBC 6.4 09/15/2016 0930   WBC 6.4 06/14/2016 0413   RBC 4.48 09/15/2016 0930   RBC 3.64 (L) 06/14/2016 0413   HGB 11.2 (L) 09/15/2016 0930   HCT 35.0 09/15/2016 0930   PLT 231 09/15/2016 0930   MCV 78.1 (L) 09/15/2016 0930   MCH 25.1 09/15/2016 0930   MCH 28.6 06/14/2016 0413   MCHC 32.1 09/15/2016 0930   MCHC 33.3 06/14/2016 0413   RDW 15.9 (H) 09/15/2016 0930   LYMPHSABS 0.9 09/15/2016 0930   MONOABS 0.6 09/15/2016 0930   EOSABS 0.0 09/15/2016 0930   BASOSABS 0.0 09/15/2016 0930    CMP     Component Value Date/Time   NA 138 09/15/2016 0930   K 3.9 09/15/2016 0930   CL 103 06/14/2016 0413   CL 101 10/25/2012 0946   CO2 27 09/15/2016 0930   GLUCOSE 72 09/15/2016 0930   GLUCOSE 90 10/25/2012 0946   BUN 24.6 09/15/2016 0930   CREATININE 0.8 09/15/2016 0930   CALCIUM 9.3 09/15/2016 0930   PROT 7.1 09/15/2016 0930   ALBUMIN 3.9 09/15/2016 0930   AST 23 09/15/2016 0930   ALT 22 09/15/2016 0930   ALKPHOS 78 09/15/2016 0930   BILITOT 0.41 09/15/2016 0930   GFRNONAA >60 06/14/2016 0413   GFRAA >60 06/14/2016 0413       ASSESSMENT and PLAN:   Primary cancer of upper outer quadrant of right female breast ([place]) Right breast invasive ductal carcinoma ER positive PR negative HER-2 positive Ki-67 44% multifocal disease 3/7 lymph nodes positive T2 N1 M0 stage IIB status post adjuvant chemotherapy with TCH followed by Herceptin maintenance, adjuvant radiation therapy and Letrozole. MRI 02/14/2015: T5 large destructive lesion with pathologic compression fracture and extensive epidural tumor, involvement of T4, excision metastatic carcinoma ER 60%, PR 0%, HER-2 positive ratio 2.71 average copy #7.45 03/27/2015: T4-6 decompression surgery for spinal cord compression PET-CT 12/10/17Rt para-spinal mass mildy hypermetabolic SUV 5.9, lytic cortical lesion 4.8 cm lesion left prox femur suv 8.8 favor osseus met disease   Treatment summary: 1. Resumed anastrozole 05/21/2015, but it has caused significant hair loss so we switched her to exemestane 25 mg once daily 07/23/2015 2. Herceptin every 3 week started 04/10/2015 3. Bone metastases: Zometa every 3 months. 4. Radiation therapy to the spine (Dr. [name]) 04/13/2015 to 05/19/2015 5. Radiation therapy to left femur 04/28/2016 to 05/12/2016  -------------------------------------------------------------------------------------------------------------------------- Goal of treatment: Palliation Zometa toxicities: bone pain and fatigue. Zometa every 3 months.  Biopsy of the subcutaneous nodule 05/16/16:Metastatic breast cancer ER 80%, PR 0%, HER-2 positive ratio 2.25, Ki-67 50%  S/P revision of back spine instrumentation 06/13/16  Treatment plan:Faslodex along with Herceptin and Perjeta first dose of Faslodex given 06/09/2016  MRI spine 07/13/2016: Stable (comment was made about a new right pleural metastases with chest wall invasion and  small right malignant pleural effusion) CT chest 07/19/2016: Small right pleural effusion, no significant change in this soft  tissue density in the right posterior medial pleural space, highly suspicious for pleural metastatic disease slight increase in mediastinal lymphadenopathy  Anxiety and Depression: We spent a long time today discussing her anxiety and depression and whether or not it could be related to the fulvestrant.  We discussed the prognosis of her disease and the fact that she is dealing with and going through a lot of stress with her illness, side effects, and prognosis.  We discussed different options such as meeting with a counselor, or starting an SSRI or SNRI.  We reviewed risks and benefits of both.  For now, we will refer to counseling and if worsens will consider adding a medication such as Effexor or Lexapro.  I will send a message to Vernie Shanks and Abby Potash to get this scheduled.    She will proceed with Herceptin and Perjeta today.  Next appt for cardio-onc evaluation is scheduled in June, 2018.  She is due for PET scan prior to next appt with [name], and I scheduled this in June as well.    Rebecka Apley will contact rehab for potential return for further rehab for her vertigo symptoms.  Should they worsen she will let us know.     All questions were answered. The patient knows to call the clinic with any problems, questions or concerns. We can certainly see the patient much sooner if necessary.  A total of (45) minutes of face-to-face time was spent with this patient with greater than 50% of that time in counseling and care-coordination.  Scot Dock, NP 09/15/2016

## 2016-09-16 ENCOUNTER — Other Ambulatory Visit: Payer: Medicare Other

## 2016-09-16 ENCOUNTER — Ambulatory Visit: Payer: Medicare Other | Admitting: Adult Health

## 2016-09-19 ENCOUNTER — Encounter: Payer: Self-pay | Admitting: *Deleted

## 2016-09-19 NOTE — Progress Notes (Signed)
[facility]  Holiday representative received referral for counseling.  CSW contacted patient at home to offer support and assess for needs.  Patient expressed interest in counseling.  CSW and patient discussed the different counseling options at [facility] and in the community.  Patient was agreeable to a referral to the [facility] counseling intern.  CSW will complete referral.  CSW encouraged patient to call with questions or concerns.    Johnnye Lana, MSW, LCSW, OSW-C Clinical Social Worker [facility] [phone]

## 2016-09-20 DIAGNOSIS — R42 Dizziness and giddiness: Secondary | ICD-10-CM | POA: Diagnosis not present

## 2016-09-20 DIAGNOSIS — J301 Allergic rhinitis due to pollen: Secondary | ICD-10-CM | POA: Diagnosis not present

## 2016-09-20 DIAGNOSIS — F419 Anxiety disorder, unspecified: Secondary | ICD-10-CM | POA: Diagnosis not present

## 2016-09-20 DIAGNOSIS — I10 Essential (primary) hypertension: Secondary | ICD-10-CM | POA: Diagnosis not present

## 2016-09-20 NOTE — Progress Notes (Signed)
[place] [facility] [facility] Counseling Note  Counselor called pt per referral from [place] Elmore/LCSW for individual counseling for anxiety, depression, and potential panic attacks. Counselor and client schedule intake session for Monday, 5/21 at [facility].  Westly Pam, [place] LPCA [place]

## 2016-09-26 ENCOUNTER — Encounter: Payer: Self-pay | Admitting: Hematology and Oncology

## 2016-09-26 NOTE — Progress Notes (Signed)
[facility] Counseling Note  First individual session with patient. Counselor and pt completed intake paperwork and counselor conducted a safety assessment with pt reporting no past or current SI or substance abuse.   Pt's presenting concern includes a "sinking, shaking, hopeless feeling" that overtakes her with increasing frequency. Pt describes experiencing increased anxiety surrounding her health and panic attacks. Counselor introduced box-breathing technique as a way for pt to "reset" her body and breathing when experiencing anxiety and panic attacks. Pt and counselor explored pt's support systems, hobbies, and current self-care methods. Counselor used supportive and empathic listening, silence, reflections of feelings and content, and minimal encouragers to help pt tell her unique story.   In next session, counselor will review the box-breathing technique to see its effectiveness, explore the problem in greater depth, and introduce another coping strategy.   Treatment goals include: decreased number of panic attacks experienced per week, increased sense of agency over anxiety/panic, exploration of emotional experience of cancer, and grief work over cancer experience.   Next session scheduled for 6/4 at [place].   Westly Pam, [place] LPCA [place]

## 2016-09-26 NOTE — Progress Notes (Signed)
Patient came in to discuss bill she received which Medicare denied. She did not have the bill with her. I reviewed her billing and saw that she had a zero balance. I reviewed the billing notes and advised her on 09/23/16, some charges were being re-submitted to Medicare. Advised her she may call the billing department back to confirm these were the charges she was speaking of and what happens next. Patient appreciated my time and patience and was given my card for any additional financial questions or concerns. She states she has the same treatment every month and this has never happened and she doesn't want it to happen again. I apologized for the inconvenience and advised her to have someone from billing to contact her when this has been resolved.

## 2016-09-29 ENCOUNTER — Other Ambulatory Visit: Payer: Self-pay | Admitting: Family Medicine

## 2016-09-29 ENCOUNTER — Ambulatory Visit
Admission: RE | Admit: 2016-09-29 | Discharge: 2016-09-29 | Disposition: A | Discharge status: Home / Self Care | Payer: Medicare Other | Source: Ambulatory Visit | Attending: Family Medicine | Admitting: Family Medicine

## 2016-09-29 DIAGNOSIS — R1011 Right upper quadrant pain: Secondary | ICD-10-CM | POA: Diagnosis not present

## 2016-09-29 DIAGNOSIS — C50411 Malignant neoplasm of upper-outer quadrant of right female breast: Secondary | ICD-10-CM

## 2016-09-29 DIAGNOSIS — C7951 Secondary malignant neoplasm of bone: Secondary | ICD-10-CM

## 2016-09-29 DIAGNOSIS — Z171 Estrogen receptor negative status [ER-]: Secondary | ICD-10-CM

## 2016-09-29 DIAGNOSIS — R079 Chest pain, unspecified: Secondary | ICD-10-CM | POA: Diagnosis not present

## 2016-09-29 DIAGNOSIS — S22059A Unspecified fracture of T5-T6 vertebra, initial encounter for closed fracture: Secondary | ICD-10-CM | POA: Diagnosis not present

## 2016-10-10 ENCOUNTER — Encounter (HOSPITAL_COMMUNITY)
Admission: RE | Admit: 2016-10-10 | Discharge: 2016-10-10 | Disposition: A | Discharge status: Home / Self Care | Payer: Medicare Other | Source: Ambulatory Visit | Attending: Hematology and Oncology | Admitting: Hematology and Oncology

## 2016-10-10 DIAGNOSIS — C50411 Malignant neoplasm of upper-outer quadrant of right female breast: Secondary | ICD-10-CM | POA: Insufficient documentation

## 2016-10-10 DIAGNOSIS — C50919 Malignant neoplasm of unspecified site of unspecified female breast: Secondary | ICD-10-CM | POA: Diagnosis not present

## 2016-10-10 LAB — GLUCOSE, CAPILLARY: Glucose-Capillary: 95 mg/dL (ref 65–99)

## 2016-10-10 MED ORDER — FLUDEOXYGLUCOSE F - 18 (FDG) INJECTION
6.8300 | Freq: Once | INTRAVENOUS | Status: AC | PRN
  Administered 2016-10-10: 6.83 via INTRAVENOUS

## 2016-10-10 NOTE — Progress Notes (Signed)
[facility] Counseling Note  Second individual session with patient. Counselor opened session with inquiry into pt's week; counselor and pt discussed son's wedding and other events. Following this, counselor and pt explored pt's anxiety surrounding today's PET scan. Pt seemed to dismiss her fear and anxiety -- stating that "it's silly."   Counselor led pt through a "Cancer Review" intervention, in which pt recounted the "big moments" of her journey with cancer (e.g., diagnosis, BMDC, surgeries, scans, biopsies, losing ability to walk, learning how to walk again, etc). Beside these "big moments," counselor noted the emotional experience of pt (e.g., embarrassed, isolated, ignored, lonely, etc). Afterwards, the counselor and pt processed the Cancer Review together; pt was able to validate her own emotional experience and recognize her experiences as traumatic. Counselor and pt discussed tx plan: to identify pt's anxiety/depressed mood triggers, introduce new coping mechanisms, and explore pt's emotional experience. Counselor closed session with scheduling of next session: 6/11 @ 9a.  Counselor provided empathetic listening, minimal encouragers, open and closed questions, reflections of feeling and content, and silence to help pt tell her unique story.  Westly Pam, [place] LPCA [place]

## 2016-10-11 NOTE — Progress Notes (Signed)
[facility] Counseling Note  Counselor called pt per message from [place] Elmore/LCSW to reschedule our appointment at [facility] 6/11. LVM with patient.  Westly Pam, [place] LPCA [place]

## 2016-10-13 ENCOUNTER — Ambulatory Visit (HOSPITAL_BASED_OUTPATIENT_CLINIC_OR_DEPARTMENT_OTHER): Payer: Medicare Other | Admitting: Hematology and Oncology

## 2016-10-13 ENCOUNTER — Ambulatory Visit: Payer: Medicare Other

## 2016-10-13 ENCOUNTER — Ambulatory Visit (HOSPITAL_BASED_OUTPATIENT_CLINIC_OR_DEPARTMENT_OTHER): Payer: Medicare Other

## 2016-10-13 ENCOUNTER — Encounter: Payer: Self-pay | Admitting: Hematology and Oncology

## 2016-10-13 ENCOUNTER — Other Ambulatory Visit (HOSPITAL_BASED_OUTPATIENT_CLINIC_OR_DEPARTMENT_OTHER): Payer: Medicare Other

## 2016-10-13 DIAGNOSIS — Z5112 Encounter for antineoplastic immunotherapy: Secondary | ICD-10-CM

## 2016-10-13 DIAGNOSIS — C50411 Malignant neoplasm of upper-outer quadrant of right female breast: Secondary | ICD-10-CM

## 2016-10-13 DIAGNOSIS — Z170 Estrogen receptor positive status [ER+]: Secondary | ICD-10-CM | POA: Diagnosis not present

## 2016-10-13 DIAGNOSIS — C7951 Secondary malignant neoplasm of bone: Secondary | ICD-10-CM

## 2016-10-13 DIAGNOSIS — Z5111 Encounter for antineoplastic chemotherapy: Secondary | ICD-10-CM | POA: Diagnosis not present

## 2016-10-13 DIAGNOSIS — C50112 Malignant neoplasm of central portion of left female breast: Secondary | ICD-10-CM

## 2016-10-13 DIAGNOSIS — C773 Secondary and unspecified malignant neoplasm of axilla and upper limb lymph nodes: Secondary | ICD-10-CM | POA: Diagnosis not present

## 2016-10-13 DIAGNOSIS — C50911 Malignant neoplasm of unspecified site of right female breast: Secondary | ICD-10-CM

## 2016-10-13 LAB — COMPREHENSIVE METABOLIC PANEL
ALT: 20 U/L (ref 0–55)
AST: 23 U/L (ref 5–34)
Albumin: 4 g/dL (ref 3.5–5.0)
Alkaline Phosphatase: 85 U/L (ref 40–150)
Anion Gap: 11 mEq/L (ref 3–11)
BUN: 16.5 mg/dL (ref 7.0–26.0)
CO2: 27 mEq/L (ref 22–29)
Calcium: 10.1 mg/dL (ref 8.4–10.4)
Chloride: 101 mEq/L (ref 98–109)
Creatinine: 0.8 mg/dL (ref 0.6–1.1)
EGFR: 75 mL/min/{1.73_m2} — ABNORMAL LOW (ref >90)
Glucose: 83 mg/dl (ref 70–140)
Potassium: 4 mEq/L (ref 3.5–5.1)
Sodium: 138 mEq/L (ref 136–145)
Total Bilirubin: 0.38 mg/dL (ref 0.20–1.20)
Total Protein: 7.4 g/dL (ref 6.4–8.3)

## 2016-10-13 LAB — CBC WITH DIFFERENTIAL/PLATELET
BASO%: 0.5 % (ref 0.0–2.0)
Basophils Absolute: 0 10*3/uL (ref 0.0–0.1)
EOS%: 1.4 % (ref 0.0–7.0)
Eosinophils Absolute: 0.1 10*3/uL (ref 0.0–0.5)
HCT: 38.8 % (ref 34.8–46.6)
HGB: 12.5 g/dL (ref 11.6–15.9)
LYMPH%: 15.1 % (ref 14.0–49.7)
MCH: 25.8 pg (ref 25.1–34.0)
MCHC: 32.2 g/dL (ref 31.5–36.0)
MCV: 80 fL (ref 79.5–101.0)
MONO#: 0.4 10*3/uL (ref 0.1–0.9)
MONO%: 7.2 % (ref 0.0–14.0)
NEUT#: 4.4 10*3/uL (ref 1.5–6.5)
NEUT%: 75.8 % (ref 38.4–76.8)
Platelets: 189 10*3/uL (ref 145–400)
RBC: 4.85 10*6/uL (ref 3.70–5.45)
RDW: 15.6 % — ABNORMAL HIGH (ref 11.2–14.5)
WBC: 5.8 10*3/uL (ref 3.9–10.3)
lymph#: 0.9 10*3/uL (ref 0.9–3.3)

## 2016-10-13 MED ORDER — ACETAMINOPHEN 325 MG PO TABS
ORAL_TABLET | ORAL | Status: AC
  Filled 2016-10-13: qty 2

## 2016-10-13 MED ORDER — FULVESTRANT 250 MG/5ML IM SOLN
500.0000 mg | Freq: Once | INTRAMUSCULAR | Status: AC
  Administered 2016-10-13: 500 mg via INTRAMUSCULAR
  Filled 2016-10-13: qty 10

## 2016-10-13 MED ORDER — DIPHENHYDRAMINE HCL 25 MG PO CAPS
ORAL_CAPSULE | ORAL | Status: AC
  Filled 2016-10-13: qty 2

## 2016-10-13 MED ORDER — SODIUM CHLORIDE 0.9 % IJ SOLN
10.0000 mL | INTRAMUSCULAR | Status: DC | PRN
  Administered 2016-10-13: 10 mL via INTRAVENOUS
  Filled 2016-10-13: qty 10

## 2016-10-13 MED ORDER — HEPARIN SOD (PORK) LOCK FLUSH 100 UNIT/ML IV SOLN
500.0000 [IU] | Freq: Once | INTRAVENOUS | Status: AC | PRN
  Administered 2016-10-13: 500 [IU]
  Filled 2016-10-13: qty 5

## 2016-10-13 MED ORDER — SODIUM CHLORIDE 0.9 % IV SOLN
Freq: Once | INTRAVENOUS | Status: AC
  Administered 2016-10-13: 12:00:00 via INTRAVENOUS

## 2016-10-13 MED ORDER — TRASTUZUMAB CHEMO 150 MG IV SOLR
6.0000 mg/kg | Freq: Once | INTRAVENOUS | Status: AC
  Administered 2016-10-13: 378 mg via INTRAVENOUS
  Filled 2016-10-13: qty 18

## 2016-10-13 MED ORDER — SODIUM CHLORIDE 0.9 % IV SOLN
420.0000 mg | Freq: Once | INTRAVENOUS | Status: AC
  Administered 2016-10-13: 420 mg via INTRAVENOUS
  Filled 2016-10-13: qty 14

## 2016-10-13 MED ORDER — DIPHENHYDRAMINE HCL 25 MG PO CAPS
50.0000 mg | ORAL_CAPSULE | Freq: Once | ORAL | Status: AC
  Administered 2016-10-13: 25 mg via ORAL

## 2016-10-13 MED ORDER — SODIUM CHLORIDE 0.9% FLUSH
10.0000 mL | INTRAVENOUS | Status: DC | PRN
  Administered 2016-10-13: 10 mL
  Filled 2016-10-13: qty 10

## 2016-10-13 MED ORDER — ACETAMINOPHEN 325 MG PO TABS
650.0000 mg | ORAL_TABLET | Freq: Once | ORAL | Status: AC
  Administered 2016-10-13: 650 mg via ORAL

## 2016-10-13 NOTE — Assessment & Plan Note (Signed)
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 MRI 02/14/2015: T5 large destructive lesion with pathologic compression fracture and extensive epidural tumor, involvement of T4, excision metastatic carcinoma ER 60%, PR 0%, HER-2 positive ratio 2.71 average copy #7.45 03/27/2015: T4-6 decompression surgery for spinal cord compression PET-CT 12/10/17Rt para-spinal mass mildy hypermetabolic SUV 5.9, lytic cortical lesion 4.8 cm lesion left prox femur suv 8.8 favor osseus met disease   Treatment summary: 1. Resumed anastrozole 05/21/2015, but it has caused significant hair loss so we switched her to exemestane 25 mg once daily 07/23/2015 2. Herceptin every 3 week started 04/10/2015 3. Bone metastases: Zometa every 3 months. 4. Radiation therapy to the spine (Dr. [name]) 04/13/2015 to 05/19/2015 5. Radiation therapy to left femur 04/28/2016 to 05/12/2016  -------------------------------------------------------------------------------------------------------------------------- Goal of treatment: Palliation Treatment plan:Faslodex along with Herceptin and Perjeta first dose of Faslodex given 06/09/2016 every 4 weeks  PET/CT scan 10/10/2016: Interval development of new hypermetabolic 2.5 cm lesion in the posterior right eighth rib, stable right paraspinal lesion at T8, clustered soft tissue nodules decreased uptake compared to December  Recommendation: Radiation therapy to the eighth rib lesion and continuation of current therapy.

## 2016-10-13 NOTE — Progress Notes (Signed)
Pt due for echo this month. Sent message to [employer] with Dr.Bensimhon to schedule pt for 74mo echo evaluation.

## 2016-10-13 NOTE — Progress Notes (Signed)
Patient Care Team: [name], MD as PCP - General (Family Medicine)  DIAGNOSIS:  Encounter Diagnosis  Name Primary?  . Primary cancer of upper outer quadrant of right female breast ([place])     SUMMARY OF ONCOLOGIC HISTORY:   Primary cancer of upper outer quadrant of right female breast ([place])   03/08/2012 Initial Diagnosis    Right breast invasive ductal carcinoma ER positive PR negative HER-2 positive Ki-67 44%; another breast mass biopsied in the anterior part of the breast which was also positive for malignancy that was HER-2 negative      05/10/2012 Surgery    Right mastectomy and axillary lymph node dissection: Multifocal disease 5 cm, 1.7 cm, 1.6 cm, ER positive PR negative HER-2 positive Ki-67 44%, 3/7 lymph nodes positive      06/14/2012 - 06/12/2013 Chemotherapy    Adjuvant chemotherapy with [place] 6 followed by Herceptin maintenance      10/25/2012 - 12/11/2012 Radiation Therapy    Adjuvant radiation therapy      02/07/2013 -  Anti-estrogen oral therapy    Letrozole 2.5 mg daily      02/14/2015 Imaging    MRI spine: Large destructive T5 lesion with severe pathologic compression fracture and extensive epidural tumor, severe spinal stenosis with moderate cord compression, tumor involvement of T4      03/18/2015 PET scan    Residual enhancing soft tissue adjacent to the spinal cord. No evidence of metastatic disease. Nonspecific uptake the left nipple      03/27/2015 - 03/30/2015 Hospital Admission    T4-6 decompression for spinal cord compression (lower extremity paralysis)      04/10/2015 -  Chemotherapy    Palliative treatment with Herceptin every 3 weeks with letrozole 2.5 mg daily      04/13/2015 - 05/19/2015 Radiation Therapy    Palliative radiation treatment to the spine      09/01/2015 Imaging    CT chest abdomen pelvis: Pathologic fracture with posterior fusion at T5 no other evidence of metastatic disease in the chest abdomen pelvis      12/23/2015 Imaging      CT CAP: new nonspecific 0.6 cm lymph node was stated mediastinum needs follow-up CT, innumerable tiny groundglass pulmonary nodules throughout both lungs unchanged, patchy consolidation from radiation, T5 fracture, no mets      04/15/2016 PET scan    Rt para-spinal mass mildy hypermetabolic SUV 5.9, lytic cortical lesion 4.8 cm lesion left prox femur suv 8.8 favor osseus met disease      04/28/2016 - 05/12/2016 Radiation Therapy    Palliative XRT Left femur      05/24/2016 Procedure    Soft tissue mass biopsy back: Metastatic breast cancer ER 80%, PR 0%, Ki-67 50%, HER-2 positive ratio 2.25      06/09/2016 -  Anti-estrogen oral therapy    Faslodex with Herceptin and Perjeta every 4 weeks      06/13/2016 - 06/15/2016 Hospital Admission    uncomplicated revision of previous thoracic instrumentation.       CHIEF COMPLIANT: Follow-up to review the PET CT scan  INTERVAL HISTORY: Heather [name] is a 71 year old with above-mentioned is metastatic breast cancer currently on Herceptin Perjeta along with Faslodex. She had a recent PET/CT scan is here to discuss the report. Overall she complains of discomfort at multiple places throughout her body including in the ribs as well as the low back.  REVIEW OF SYSTEMS:   Constitutional: Denies fevers, chills or abnormal weight loss Eyes: Denies  blurriness of vision Ears, nose, mouth, throat, and face: Denies mucositis or sore throat Respiratory: Denies cough, dyspnea or wheezes Cardiovascular: Denies palpitation, chest discomfort Gastrointestinal:  Denies nausea, heartburn or change in bowel habits Skin: Denies abnormal skin rashes Lymphatics: Denies new lymphadenopathy or easy bruising Neurological: Previous   paralysis, able to walk with a cane Behavioral/Psych: Mood is stable, no new changes  Extremities: No lower extremity edema Breast:  denies any pain or lumps or nodules in either breasts All other systems were reviewed with the patient and  are negative.  I have reviewed the past medical history, past surgical history, social history and family history with the patient and they are unchanged from previous note.  ALLERGIES:  is allergic to other; acyclovir and related; codeine; keflex [cephalexin]; naprosyn [naproxen]; oxycodone; tramadol; and tussin [guaifenesin].  MEDICATIONS:  Current Outpatient Prescriptions  Medication Sig Dispense Refill  . b complex vitamins tablet Take 1 tablet by mouth daily.    . Calcium Carbonate Antacid (TUMS PO) Take 2 tablets by mouth 2 (two) times daily as needed (acid reflux).    . Cholecalciferol (VITAMIN D) 2000 UNITS tablet Take 2,000 Units by mouth daily.    . Coenzyme Q10 (COQ10) 100 MG CAPS Take 300 mg by mouth at bedtime.     . gabapentin (NEURONTIN) 600 MG tablet Take 1 tablet (600 mg total) by mouth as directed. Take 1 tablet in the AM and afternoon and 1 1/2 tabs at bedtime 315 tablet 2  . HYDROcodone-acetaminophen (NORCO/VICODIN) 5-325 MG tablet     . lidocaine-prilocaine (EMLA) cream Apply to affected area once (Patient taking differently: Apply 1 application topically every 21 ( twenty-one) days. Apply to port prior to infusions) 30 g 3  . OVER THE COUNTER MEDICATION Place 1 drop into both eyes 2 (two) times daily as needed (dry eyes). Over the counter lubricating eye drops    . polyethylene glycol (MIRALAX / GLYCOLAX) packet Take 17 g by mouth 2 (two) times daily. (Patient taking differently: Take 17 g by mouth daily as needed for moderate constipation. ) 100 each 0  . pravastatin (PRAVACHOL) 10 MG tablet Take 10 mg by mouth every Monday, Wednesday, and Friday.     . Probiotic Product (PROBIOTIC DAILY PO) Take 1 capsule by mouth daily.     . prochlorperazine (COMPAZINE) 10 MG tablet Take 10 mg by mouth every 6 (six) hours as needed for nausea or vomiting.    . ranitidine (ZANTAC) 300 MG tablet Take 300 mg by mouth at bedtime as needed. Reported on 06/25/2015    . spironolactone  (ALDACTONE) 25 MG tablet Take 0.5 tablets (12.5 mg total) by mouth daily. (Patient not taking: Reported on 07/05/2016) 45 tablet 12   No current facility-administered medications for this visit.    Facility-Administered Medications Ordered in Other Visits  Medication Dose Route Frequency Provider Last Rate Last Dose  . sodium chloride flush (NS) 0.9 % injection 10 mL  10 mL Intracatheter PRN [name], MD   10 mL at 10/13/16 1423    PHYSICAL EXAMINATION: ECOG PERFORMANCE STATUS: 1 - Symptomatic but completely ambulatory  Vitals:   10/13/16 1040  BP: (!) 154/71  Pulse: 77  Resp: 18  Temp: 98.1 F (36.7 C)   Filed Weights   10/13/16 1040  Weight: 136 lb 9.6 oz (62 kg)    GENERAL:alert, no distress and comfortable SKIN: skin color, texture, turgor are normal, no rashes or significant lesions EYES: normal, Conjunctiva are pink and non-injected,  sclera clear OROPHARYNX:no exudate, no erythema and lips, buccal mucosa, and tongue normal  NECK: supple, thyroid normal size, non-tender, without nodularity LYMPH:  no palpable lymphadenopathy in the cervical, axillary or inguinal LUNGS: clear to auscultation and percussion with normal breathing effort HEART: regular rate & rhythm and no murmurs and no lower extremity edema ABDOMEN:abdomen soft, non-tender and normal bowel sounds MUSCULOSKELETAL:no cyanosis of digits and no clubbing  NEURO: alert & oriented x 3 with fluent speech, no focal motor/sensory deficits EXTREMITIES: No lower extremity edema  LABORATORY DATA:  I have reviewed the data as listed   Chemistry      Component Value Date/Time   NA 138 10/13/2016 0928   K 4.0 10/13/2016 0928   CL 103 06/14/2016 0413   CL 101 10/25/2012 0946   CO2 27 10/13/2016 0928   BUN 16.5 10/13/2016 0928   CREATININE 0.8 10/13/2016 0928      Component Value Date/Time   CALCIUM 10.1 10/13/2016 0928   ALKPHOS 85 10/13/2016 0928   AST 23 10/13/2016 0928   ALT 20 10/13/2016 0928    BILITOT 0.38 10/13/2016 0928       Lab Results  Component Value Date   WBC 5.8 10/13/2016   HGB 12.5 10/13/2016   HCT 38.8 10/13/2016   MCV 80.0 10/13/2016   PLT 189 10/13/2016   NEUTROABS 4.4 10/13/2016    ASSESSMENT & PLAN:  Primary cancer of upper outer quadrant of right female breast ([place]) Right breast invasive ductal carcinoma ER positive PR negative HER-2 positive Ki-67 44% multifocal disease 3/7 lymph nodes positive T2 N1 M0 stage IIB status post adjuvant chemotherapy with TCH followed by Herceptin maintenance, adjuvant radiation therapy and Letrozole. MRI 02/14/2015: T5 large destructive lesion with pathologic compression fracture and extensive epidural tumor, involvement of T4, excision metastatic carcinoma ER 60%, PR 0%, HER-2 positive ratio 2.71 average copy #7.45 03/27/2015: T4-6 decompression surgery for spinal cord compression PET-CT 12/10/17Rt para-spinal mass mildy hypermetabolic SUV 5.9, lytic cortical lesion 4.8 cm lesion left prox femur suv 8.8 favor osseus met disease   Treatment summary: 1. Resumed anastrozole 05/21/2015, but it has caused significant hair loss so we switched her to exemestane 25 mg once daily 07/23/2015 2. Herceptin every 3 week started 04/10/2015 3. Bone metastases: Zometa every 3 months. 4. Radiation therapy to the spine (Dr. [name]) 04/13/2015 to 05/19/2015 5. Radiation therapy to left femur 04/28/2016 to 05/12/2016  -------------------------------------------------------------------------------------------------------------------------- Goal of treatment: Palliation Treatment plan:Faslodex along with Herceptin and Perjeta first dose of Faslodex given 06/09/2016 every 4 weeks  PET/CT scan 10/10/2016: Interval development of new hypermetabolic 2.5 cm lesion in the posterior right eighth rib, stable right paraspinal lesion at T8, clustered soft tissue nodules decreased uptake compared to December  Recommendation: Radiation therapy to the  eighth rib lesion and continuation of current therapy. I sent a message to Dr. [name] to evaluate the patient for palliative radiation.   I spent 25 minutes talking to the patient of which more than half was spent in counseling and coordination of care.  No orders of the defined types were placed in this encounter.  The patient has a good understanding of the overall plan. she agrees with it. she will call with any problems that may develop before the next visit here.   Rulon Eisenmenger, MD 10/13/16

## 2016-10-13 NOTE — Patient Instructions (Signed)
[facility] Discharge Instructions for Patients Receiving Chemotherapy  Today you received the following chemotherapy agents Herceptin/Perjeta To help prevent nausea and vomiting after your treatment, we encourage you to take your nausea medication as prescribed.   If you develop nausea and vomiting that is not controlled by your nausea medication, call the clinic.   BELOW ARE SYMPTOMS THAT SHOULD BE REPORTED IMMEDIATELY:  *FEVER GREATER THAN 100.5 F  *CHILLS WITH OR WITHOUT FEVER  NAUSEA AND VOMITING THAT IS NOT CONTROLLED WITH YOUR NAUSEA MEDICATION  *UNUSUAL SHORTNESS OF BREATH  *UNUSUAL BRUISING OR BLEEDING  TENDERNESS IN MOUTH AND THROAT WITH OR WITHOUT PRESENCE OF ULCERS  *URINARY PROBLEMS  *BOWEL PROBLEMS  UNUSUAL RASH Items with * indicate a potential emergency and should be followed up as soon as possible.  Feel free to call the clinic you have any questions or concerns. The clinic phone number is [phone].  Please show the [place] at check-in to the Emergency Department and triage nurse.

## 2016-10-13 NOTE — Patient Instructions (Signed)

## 2016-10-14 ENCOUNTER — Other Ambulatory Visit: Payer: Medicare Other

## 2016-10-14 ENCOUNTER — Ambulatory Visit: Payer: Medicare Other | Admitting: Hematology and Oncology

## 2016-10-14 DIAGNOSIS — M940 Chondrocostal junction syndrome [Tietze]: Secondary | ICD-10-CM | POA: Diagnosis not present

## 2016-10-14 DIAGNOSIS — M5414 Radiculopathy, thoracic region: Secondary | ICD-10-CM | POA: Diagnosis not present

## 2016-10-14 DIAGNOSIS — I10 Essential (primary) hypertension: Secondary | ICD-10-CM | POA: Diagnosis not present

## 2016-10-18 ENCOUNTER — Encounter: Payer: Self-pay | Admitting: Radiation Oncology

## 2016-10-18 DIAGNOSIS — F419 Anxiety disorder, unspecified: Secondary | ICD-10-CM | POA: Diagnosis not present

## 2016-10-18 DIAGNOSIS — G44209 Tension-type headache, unspecified, not intractable: Secondary | ICD-10-CM | POA: Diagnosis not present

## 2016-10-18 DIAGNOSIS — R42 Dizziness and giddiness: Secondary | ICD-10-CM | POA: Diagnosis not present

## 2016-10-18 DIAGNOSIS — I10 Essential (primary) hypertension: Secondary | ICD-10-CM | POA: Diagnosis not present

## 2016-10-18 NOTE — Progress Notes (Signed)
[facility] Counseling Note  Third individual session with patient.   Counselor opened session with inquiry into pt's week. Client stated that she has reflected at length about our previous session and has continued to find relief from anxiety/panic attacks through the box breathing technique. Pt and counselor focused this session on client's results from her PET scan (another cancerous spot was found on her ribs), and the emotional impact of the result. Patient tends to dismiss her emotional experience in favor of focusing on other people's needs. Counselor and patient processed how this can be helpful and adaptive, but can also hinder pt's grieving process.   Patient reports that she feels "obsessed" with her cancer and "can't stop thinking about it." Counselor and pt processed what it would be like to allow herself to think about cancer and the emotional toll cancer has wrought without judging herself as being "selfish." Patient agreed that allowing herself space to think about and validate her experiences will allow her to find peace and space from her anxious thoughts (IE, if I say, "Don't think about the pink elephant over there," it will be all that I can think about. However, if I allow myself to acknowledge the pink elephant, then my mind will rest and I can think about other things.)   Counselor and patient agreed on a homework assignment: pt will journal for 30 minutes each day about her thoughts, feelings, and/or fears about cancer, and will note how/if it decreases her anxious thoughts throughout the week. Counselor and pt closed with scheduling: 6/25 at [place].   Westly Pam, [place] [facility] [place]

## 2016-10-18 NOTE — Progress Notes (Signed)
Histology and Location of Primary Cancer: Carcinoma of left breast metastatic to bone   Sites of Visceral and Bony Metastatic Disease: Interval development of a new hypermetabolic 2.5 x 1.1 cm lesion in the posterior right eighth rib consistent with metastatic disease.   Location(s) of Symptomatic Metastases:  complains of discomfort at multiple places throughout her body including in the ribs as well as the low back  Past/Anticipated chemotherapy by medical oncology, if any:   06/14/2012 - 06/12/2013 Chemotherapy    Adjuvant chemotherapy with Taxotere Carboplatin Herceptin 6 followed by Herceptin maintenance   04/10/2015 -06/02/2016  Chemotherapy     Palliative treatment with Herceptin every 3 weeks with letrozole 2.5 mg daily    06/09/2016 -  Anti-estrogen oral therapy     Faslodex with Herceptin and Perjeta every 4 weeks   Plus Zometa Q 3 months   Pain on a scale of 0-10 is: 1/10 upper back taking Hydrocodone   If Spine Met(s), symptoms, if any, include:  Bowel/Bladder retention or incontinence (please describe): No  Numbness or weakness in extremities (please describe): Yes numbness to lower extremities  Current Decadron regimen, if applicable: N/A  Ambulatory status? Walker? Wheelchair?: Ambulating with a cane  SAFETY ISSUES: Prior radiation? Yes;   Radiation treatment dates:   04/27/16-05/12/16  Site/dose:  Left femur/ 30 Gy in 10 fractions   Radiation treatment dates:   04/13/2015-05/19/2015  Site/dose:   55 Gy in 25 fractions of 2.2 Gy to the thoracic spine    Radiation treatment dates: 10/25/2012 through 12/11/2012                                                      Site/Dose:   Right chest wall/regional lymph nodes 5040 cGy 28 sessions, right chest wall boost 1000 cGy I sessions.                          Pacemaker/ICD? No  Possible current pregnancy? No  Is the patient on methotrexate? No  Current Complaints / other details: 71 year old married   woman Wt Readings from Last 3 Encounters:  10/13/16 136 lb 9.6 oz (62 kg)  09/15/16 137 lb 3.2 oz (62.2 kg)  08/19/16 139 lb 9.6 oz (63.3 kg)  BP (!) 153/71   Pulse 74   Temp 97.8 F (36.6 C) (Oral)   Resp 16   Ht 5' 3.5" (1.613 m)   Wt 135 lb 12.8 oz (61.6 kg)   SpO2 100%   BMI 23.68 kg/m

## 2016-10-21 DIAGNOSIS — R21 Rash and other nonspecific skin eruption: Secondary | ICD-10-CM | POA: Diagnosis not present

## 2016-10-24 NOTE — Progress Notes (Signed)
  Radiation Oncology         [phone] ________________________________  Name: Heather [name] MRN: [id number]  Date: 10/26/2016  DOB: 1945/06/11  SIMULATION AND TREATMENT PLANNING NOTE    ICD-10-CM   1. Primary cancer of upper outer quadrant of right female breast ([place]) C50.411   2. Breast cancer metastasized to bone, right ([place]) C50.911    C79.51     DIAGNOSIS:  71 yo woman with an isolated painful metastasis to the posterior right 8th rib from the upper outer right breast.      NARRATIVE:  The patient was brought to the [facility].  Identity was confirmed.  All relevant records and images related to the planned course of therapy were reviewed.  The patient freely provided informed written consent to proceed with treatment after reviewing the details related to the planned course of therapy. The consent form was witnessed and verified by the simulation staff.  Then, the patient was set-up in a stable reproducible  supine position for radiation therapy.  CT images were obtained.  Surface markings were placed.  The CT images were loaded into the planning software.  Then the target and avoidance structures were contoured.  Treatment planning then occurred.  The radiation prescription was entered and confirmed.  Then, I designed and supervised the construction of a total of one medically necessary complex treatment devices BodyFix holder.  I have requested : 3D Simulation  I have requested a DVH of the following structures: left lung, right lung, heart and spinal cord plus the tumor  SPECIAL TREATMENT PROCEDURE:  The planned course of therapy using radiation constitutes a special treatment procedure. Special care is required in the management of this patient for the following reasons. This treatment constitutes a Special Treatment Procedure for the following reason: [ Retreatment in a previously radiated area requiring careful monitoring of increased risk of toxicity due to  overlap of previous treatment..  The special nature of the planned course of radiotherapy will require increased physician supervision and oversight to ensure patient's safety with optimal treatment outcomes.   PLAN:  The patient will receive 30 Gy in 10 fraction.  ________________________________  Sheral Apley [name], M.D.

## 2016-10-25 NOTE — Progress Notes (Signed)
Radiation Oncology         [phone] ________________________________  Name: Heather [name] MRN: [id number]  Date: 10/26/2016  DOB: 02/24/1946  Reconsult Note:  CC: [name], MD  [name], MD  Diagnosis:  71 yo woman with ER positive breast cancer and isolated painful metastasis to the posterior right 8th rib  Interval Since Last Radiation: 5 months  04/27/16-05/12/16:  Left femur/ 30 Gy in 10 fractions  04/13/2015-05/19/2015: 55 Gy in 25 fractions of 2.2 Gy to the thoracic spine  10/25/12-12/11/12: 50.4 Gy to the right chest wall and regional lymph nodes in 28 fractions with a 10 Gy boost to the right chest wall in 1 session.  Narrative:  The patient returns today for routine follow-up. Shortness is a well known patient to our service with a history of metastatic breast cancer originally treated in 2014 to the chest wall and regional lymph nodes. She developed recurrent disease within the thoracic spine and subsequently has undergone 3 surgeries, in addition to radiotherapy, with her most recent surgery being on 06/13/2016 where she had removal of thoracic hardware and replacement of pedicle screws with stabilization. She has done well in the spine however from a disease perspective, and most recently completed a course of radiotherapy in the palliative approach to the left femur due to pain control. She has been followed in our spine conference and her most recent complete spine survey in March 2018 that revealed stable findings in the entire spine, with a new right pleural metastasis. She did not have any new disease int he lung but persistence of her previously noted metastatic disease. She has recently been on  Perjeta and Herceptin with continuation of Faslodex and Zometa. She had a repeat PET scan on 10/10/16 which revealed stable to smaller soft tissue nodules in the posterior thorax near the cervicothoracic junction, and stable hypermetabolism in the right paraspinal disease at  T8. There was a new hypermetabolic 2.5 x 1.1 cm lesion in the posterior right 8th rib with an SUV of 8.9. She comes today to discuss palliative radiotherapy to this site.        On review of systems, the patient reports that she has been having discomfort in the right chest since the end of May. She continues to have soreness with minimal effect. She denies any sternal chest pain, shortness of breath, cough, fevers, chills, night sweats, unintended weight changes. Shedenies any bowel or bladder disturbances, and denies abdominal pain, nausea or vomiting. She denies any new musculoskeletal or joint aches or pains, new skin lesions or concerns. She describes anxiety regarding her illness and has been having headaches, dizziness, and one episode of visual change. A complete review of systems is obtained and is otherwise negative.  Past Medical History:  Past Medical History:  Diagnosis Date  . Anemia    hx of  . Anxiety    r/t updated surgery  . Breast cancer ([place])    right  . Cataract    immature-not sure of which eye  . Complication of anesthesia    pt states she is sensitive to meds  . Dizziness    has been going on for 17month;medical MD aware  . Genetic testing 07/11/2016   Test Results: Normal; No pathogenic mutations detected  Genes Analyzed: 43 genes on [employer] Common Cancers panel (APC, ATM, AXIN2, BARD1, BMPR1A, BRCA1, BRCA2, BRIP1, CDH1, CDKN2A, CHEK2, DICER1, EPCAM, GREM1, HOXB13, KIT, MEN1, MLH1, MSH2, MSH6, MUTYH, NBN, NF1, PALB2, PDGFRA, PMS2, POLD1, POLE,  PTEN, RAD50, RAD51C, RAD51D, SDHA, SDHB, SDHC, SDHD, SMAD4, SMARCA4, STK11, TP53, TSC1, TSC2, VHL).  Marland Kitchen GERD (gastroesophageal reflux disease)    Tums prn  . H. pylori infection   . H/O hiatal hernia   . Hemorrhoids   . History of UTI   . Hx of radiation therapy 10/25/12- 12/11/12   right chest wall/regional lymph nodes 5040 cGy, 28 sessions, right chest wall boost 1000 cGy 1 session  . Hx: UTI (urinary tract infection)   .  Hyperlipidemia    but not on meds;diet and exercise controlled  . Hypertension    recently started Aldactone   . Insomnia    takes Melatoniin daily  . Metastasis to spinal column ([facility])    T5, Left  Femur cancer- radiation   . Neuropathy    "from back surgery"  . PONV (postoperative nausea and vomiting)    pt experienced hair loss, confusion and combative- 02/15/15  . Right knee pain   . Right shoulder pain   . Skin cancer    squamous, Nodule to back - "same cancer as breast."  . Urinary frequency    d/t taking Aldactone    Past Surgical History: Past Surgical History:  Procedure Laterality Date  . COLONOSCOPY    . cyst removed from left breast  1970  . DECOMPRESSIVE LUMBAR LAMINECTOMY LEVEL 4 N/A 02/15/2015   Procedure: DECOMPRESSION T5 AND T3-T7 STABALIZATION;  Surgeon: Consuella Lose, MD;  Location: [facility];  Service: Neurosurgery;  Laterality: N/A;  . ESOPHAGOGASTRODUODENOSCOPY    . LAMINECTOMY N/A 03/27/2015   Procedure: Thoracic Four-Thoracic Six Laminectomy for tumor;  Surgeon: Consuella Lose, MD;  Location: [facility];  Service: Neurosurgery;  Laterality: N/A;  T4-T6 Laminectomy  . left wrist surgery   2004   with plate  . MASTECTOMY MODIFIED RADICAL  05/10/2012   Procedure: MASTECTOMY MODIFIED RADICAL;  Surgeon: Merrie Roof, MD;  Location: [facility];  Service: General;  Laterality: Right;  RIGHT MODIFIED RADICAL MASTECTOMY  . MASTECTOMY, RADICAL Right   . MINOR BREAST BIOPSY Left 05/16/2016   Procedure: EXCISION OF BACK NODULE;  Surgeon: Autumn Messing III, MD;  Location: [facility];  Service: General;  Laterality: Left;  . Nodule  back  05/17/2015  . PORTACATH PLACEMENT  05/10/2012   Procedure: INSERTION PORT-A-CATH;  Surgeon: Merrie Roof, MD;  Location: [facility];  Service: General;  Laterality: Left;    Social History:  Social History   Social History  . Marital status: Married    Spouse name: N/A  . Number of children: 2  . Years of  education: N/A   Occupational History  . Not on file.   Social History Main Topics  . Smoking status: Never Smoker  . Smokeless tobacco: Never Used  . Alcohol use No  . Drug use: No  . Sexual activity: Yes    Birth control/ protection: Post-menopausal   Other Topics Concern  . Not on file   Social History Narrative  . No narrative on file  The patient is married and lives in [place].   Family History: Family History  Problem Relation Age of Onset  . Leukemia Mother 72       deceased 76  . Stroke Father   . Diabetes Mellitus II Father   . Prostate cancer Brother 44       currently 25  . Stomach cancer Maternal Grandmother 76       deceased 54  . Prostate  cancer Brother 11       currently 34     ALLERGIES:  is allergic to other; acyclovir and related; codeine; keflex [cephalexin]; naprosyn [naproxen]; oxycodone; tramadol; and tussin [guaifenesin].  Meds: Current Outpatient Prescriptions  Medication Sig Dispense Refill  . b complex vitamins tablet Take 1 tablet by mouth daily.    . Cholecalciferol (VITAMIN D) 2000 UNITS tablet Take 2,000 Units by mouth daily.    . Coenzyme Q10 (COQ10) 100 MG CAPS Take 300 mg by mouth at bedtime.     . gabapentin (NEURONTIN) 600 MG tablet Take 1 tablet (600 mg total) by mouth as directed. Take 1 tablet in the AM and afternoon and 1 1/2 tabs at bedtime 315 tablet 2  . lidocaine-prilocaine (EMLA) cream Apply to affected area once (Patient taking differently: Apply 1 application topically every 21 ( twenty-one) days. Apply to port prior to infusions) 30 g 3  . OVER THE COUNTER MEDICATION Place 1 drop into both eyes 2 (two) times daily as needed (dry eyes). Over the counter lubricating eye drops    . polyethylene glycol (MIRALAX / GLYCOLAX) packet Take 17 g by mouth 2 (two) times daily. (Patient taking differently: Take 17 g by mouth daily as needed for moderate constipation. ) 100 each 0  . pravastatin (PRAVACHOL) 10 MG tablet Take 10 mg by  mouth every Monday, Wednesday, and Friday.     . Probiotic Product (PROBIOTIC DAILY PO) Take 1 capsule by mouth daily.     Marland Kitchen spironolactone (ALDACTONE) 25 MG tablet Take 0.5 tablets (12.5 mg total) by mouth daily. (Patient taking differently: Take 25 mg by mouth daily. Taking 25 mg po daily) 45 tablet 12  . Calcium Carbonate Antacid (TUMS PO) Take 2 tablets by mouth 2 (two) times daily as needed (acid reflux).    Marland Kitchen HYDROcodone-acetaminophen (NORCO/VICODIN) 5-325 MG tablet     . prochlorperazine (COMPAZINE) 10 MG tablet Take 10 mg by mouth every 6 (six) hours as needed for nausea or vomiting.    . ranitidine (ZANTAC) 300 MG tablet Take 300 mg by mouth at bedtime as needed. Reported on 06/25/2015     No current facility-administered medications for this encounter.     Physical Findings:  height is 5' 3.5" (1.613 m) and weight is 135 lb 12.8 oz (61.6 kg). Her oral temperature is 97.8 F (36.6 C). Her blood pressure is 153/71 (abnormal) and her pulse is 74. Her respiration is 16 and oxygen saturation is 100%.  Pain Assessment Pain Score: 1  (upper back)/10 In general this is a well appearing caucasian female in no acute distress. She is alert and oriented x4 and appropriate throughout the examination. HEENT reveals that the patient is normocephalic, atraumatic. EOMs are intact. PERRLA. Skin is intact without any evidence of gross lesions. Cardiovascular exam reveals a regular rate and rhythm, no clicks rubs or murmurs are auscultated. Chest is clear to auscultation bilaterally. Lower extremities are negative for pretibial pitting edema, deep calf tenderness, cyanosis or clubbing.   Lab Findings: Lab Results  Component Value Date   WBC 5.8 10/13/2016   HGB 12.5 10/13/2016   HCT 38.8 10/13/2016   MCV 80.0 10/13/2016   PLT 189 10/13/2016     Radiographic Findings: Dg Chest 2 View  Result Date: 09/29/2016 CLINICAL DATA:  Right-sided chest pain. EXAM: CHEST  2 VIEW COMPARISON:  CT scan of  July 19, 2016. FINDINGS: The heart size and mediastinal contours are within normal limits. Both lungs are clear. No  pneumothorax or pleural effusion is noted. Right internal jugular Port-A-Cath is noted with distal tip in expected position of cavoatrial junction. Status post surgical posterior fusion of thoracic spine. IMPRESSION: No active cardiopulmonary disease. Electronically Signed   By: Marijo Conception, M.D.   On: 09/29/2016 15:49   Dg Thoracic Spine W/swimmers  Result Date: 09/29/2016 CLINICAL DATA:  Acute right-sided chest pain. EXAM: THORACIC SPINE - 3 VIEWS COMPARISON:  Radiographs of August 15, 2016. FINDINGS: Status post surgical posterior fusion extending from T2-T11, with intrapedicular screw placement at T2, T3, T4, T8, T9, T10 and T11. Stable old fracture of T5 vertebral body is noted. No spondylolisthesis is noted. No acute fracture is noted. IMPRESSION: Stable T5 compression fracture. Status post surgical posterior fusion of thoracic spine. No acute abnormality is noted. Electronically Signed   By: Marijo Conception, M.D.   On: 09/29/2016 15:46   Nm Pet Image Restag (ps) Skull Base To Thigh  Result Date: 10/10/2016 CLINICAL DATA:  Subsequent treatment strategy for breast cancer. EXAM: NUCLEAR MEDICINE PET SKULL BASE TO THIGH TECHNIQUE: 6.8 mCi F-18 FDG was injected intravenously. Full-ring PET imaging was performed from the skull base to thigh after the radiotracer. CT data was obtained and used for attenuation correction and anatomic localization. FASTING BLOOD GLUCOSE:  Value: 137 mg/dl COMPARISON:  04/15/2016 FINDINGS: NECK As seen on the previous study, there clustered subcutaneous nodules in the posterior back near the cervicothoracic junction. These are partially obscured by streak artifact from the spinal hardware but appear stable with slightly improved in the interval. SUV max in a right subcutaneous nodule measuring 18 x 9 mm is 4.4 (image 38 series 4) and this is stable is slightly  decreased since prior study. CHEST No evidence for hypermetabolic axillary or mediastinal lymphadenopathy. No hypermetabolic lung lesion. Right Port-A-Cath tip is positioned in the lower right atrium. No pericardial effusion. Post radiation fibrosis again identified anterior right lung. Chronic subsegmental atelectasis or linear scarring in the left lower lobe is unchanged. No new or suspicious pulmonary nodule or mass. No pleural effusion. ABDOMEN/PELVIS No abnormal hypermetabolic activity within the liver, pancreas, adrenal glands, or spleen. No hypermetabolic lymph nodes in the abdomen or pelvis. 8 mm subcutaneous nodule anterior right hip is minimally more confluent on CT images today than on the prior study and shows low level FDG uptake with SUV max = 2.6 SKELETON The hypermetabolic right paraspinal lesion at the T8 level persists and there has been interval development of 2.5 x 1.1 cm lesion in the posterior right eighth rib with SUV max = 8.9. IMPRESSION: 1. Interval development of a new hypermetabolic 2.5 x 1.1 cm lesion in the posterior right eighth rib consistent with metastatic disease. 2. Stable mildly hypermetabolic right paraspinal lesion at the level of T8. 3. Clustered soft tissue nodules in the posterior midline and paramidline back near the cervicothoracic junction, similar to prior but measuring slightly smaller in showing slightly decreased FDG uptake compared to 04/15/2016. Electronically Signed   By: Misty Stanley M.D.   On: 10/10/2016 14:17    Impression/Plan: 23.  71 yo woman with ER positive breast cancer and isolated painful metastasis to the posterior right 8th rib.  She has metastatic breast cancer to the bone with newly noted right 8th posterior rib involvement. We reviewed the findings from her recent PET scan, and discussed that we would offer her a course of radiotherapy to the right posterior 8th rib. We anticipate fusing this scan with her previous treatment fields  and outlines  the risks, benefits, short, and long term effects of radiotherapy, and the patient is interested in proceeding. We discussed the delivery and logistics of radiotherapy and would offer her a course of 30 Gy in 10 fractions. Written consent is obtained and placed in the chart, a copy was provided to the patient. She will simulate today. 2. Anxiety regarding medical illness. She has been meeting with counseling and we support this.  3. Headaches, dizziness, and an episode of visual change. We discussed her symptoms, and would recommend an MRI of the brain to rule out disease.  In a visit lasting 50 minutes, greater than 50% of the time was spent face to face discussing counseling, and coordinating the patient's care.     Carola Rhine, PAC with  Sheral Apley [name], M.D.

## 2016-10-26 ENCOUNTER — Encounter: Payer: Self-pay | Admitting: Urology

## 2016-10-26 ENCOUNTER — Ambulatory Visit
Admission: RE | Admit: 2016-10-26 | Discharge: 2016-10-26 | Disposition: A | Discharge status: Home / Self Care | Payer: Medicare Other | Source: Ambulatory Visit | Attending: Radiation Oncology | Admitting: Radiation Oncology

## 2016-10-26 ENCOUNTER — Ambulatory Visit
Admission: RE | Admit: 2016-10-26 | Discharge: 2016-10-26 | Disposition: A | Discharge status: Home / Self Care | Payer: Medicare Other | Source: Ambulatory Visit | Attending: Urology | Admitting: Urology

## 2016-10-26 VITALS — BP 153/71 | HR 74 | Temp 97.8°F | Resp 16 | Ht 63.5 in | Wt 135.8 lb

## 2016-10-26 DIAGNOSIS — F419 Anxiety disorder, unspecified: Secondary | ICD-10-CM | POA: Insufficient documentation

## 2016-10-26 DIAGNOSIS — Z833 Family history of diabetes mellitus: Secondary | ICD-10-CM | POA: Diagnosis not present

## 2016-10-26 DIAGNOSIS — C7951 Secondary malignant neoplasm of bone: Secondary | ICD-10-CM

## 2016-10-26 DIAGNOSIS — Z888 Allergy status to other drugs, medicaments and biological substances status: Secondary | ICD-10-CM | POA: Insufficient documentation

## 2016-10-26 DIAGNOSIS — Z800 Family history of malignant neoplasm of digestive organs: Secondary | ICD-10-CM | POA: Insufficient documentation

## 2016-10-26 DIAGNOSIS — G893 Neoplasm related pain (acute) (chronic): Secondary | ICD-10-CM | POA: Diagnosis not present

## 2016-10-26 DIAGNOSIS — C50411 Malignant neoplasm of upper-outer quadrant of right female breast: Secondary | ICD-10-CM

## 2016-10-26 DIAGNOSIS — Z8042 Family history of malignant neoplasm of prostate: Secondary | ICD-10-CM | POA: Insufficient documentation

## 2016-10-26 DIAGNOSIS — Z9012 Acquired absence of left breast and nipple: Secondary | ICD-10-CM | POA: Insufficient documentation

## 2016-10-26 DIAGNOSIS — Z510 Encounter for antineoplastic radiation therapy: Secondary | ICD-10-CM | POA: Diagnosis not present

## 2016-10-26 DIAGNOSIS — Z9889 Other specified postprocedural states: Secondary | ICD-10-CM | POA: Diagnosis not present

## 2016-10-26 DIAGNOSIS — R519 Headache, unspecified: Secondary | ICD-10-CM

## 2016-10-26 DIAGNOSIS — Z79899 Other long term (current) drug therapy: Secondary | ICD-10-CM | POA: Insufficient documentation

## 2016-10-26 DIAGNOSIS — C50911 Malignant neoplasm of unspecified site of right female breast: Secondary | ICD-10-CM

## 2016-10-26 DIAGNOSIS — Z823 Family history of stroke: Secondary | ICD-10-CM | POA: Insufficient documentation

## 2016-10-26 DIAGNOSIS — R51 Headache: Secondary | ICD-10-CM | POA: Insufficient documentation

## 2016-10-26 DIAGNOSIS — Z170 Estrogen receptor positive status [ER+]: Secondary | ICD-10-CM | POA: Insufficient documentation

## 2016-10-26 DIAGNOSIS — R42 Dizziness and giddiness: Secondary | ICD-10-CM | POA: Diagnosis not present

## 2016-10-26 DIAGNOSIS — Z853 Personal history of malignant neoplasm of breast: Secondary | ICD-10-CM | POA: Diagnosis not present

## 2016-10-26 DIAGNOSIS — Z885 Allergy status to narcotic agent status: Secondary | ICD-10-CM | POA: Insufficient documentation

## 2016-10-26 DIAGNOSIS — C7931 Secondary malignant neoplasm of brain: Secondary | ICD-10-CM | POA: Insufficient documentation

## 2016-10-26 DIAGNOSIS — C782 Secondary malignant neoplasm of pleura: Secondary | ICD-10-CM | POA: Diagnosis not present

## 2016-10-28 ENCOUNTER — Ambulatory Visit (HOSPITAL_COMMUNITY): Payer: Medicare Other

## 2016-10-31 ENCOUNTER — Ambulatory Visit: Payer: Medicare Other | Admitting: Radiation Oncology

## 2016-10-31 NOTE — Progress Notes (Signed)
[facility] Counseling Note  Fourth individual counseling session with patient.   Counselor opened session with inquiry into pt's week. Pt had many updates for counselor, and counselor and pt were able to sort the events into "victories" and "challenges." Counselor validated the emotional toll of the challenges, while also celebrating the pt's successes. Counselor and pt reviewed client's homework from last session (journaling and progressive muscle relaxation); she states that journaling has been very helpful but was unable to practice progressive muscle relaxation due to muscle spasms.   Pt reports feeling helpless and lonely lately. Pt seems to be frustrated about her physical health, which is "causing emotional setbacks. it seems to be one step forward, a physical problem comes up, and then I'm two steps backwards again." EC is struggling with the feeling of powerlessness that she has over cancer. Counselor and pt discussed her emotional support system, and she reported she usually reaches out for "the big moments, but not really for the day to day stuff." EC fears being a "burden" to her loved ones. Counselor and pt discussed ways that client can ask for support, while also maintaining emotional boundaries and independence.  Counselor and pt agreed that [facility] will journal about her feelings of powerlessness and lack of control at least once this coming week. Counselor and pt will participate in a control or bill of rights intervention next week. Counselor and pt closed with scheduling: 7/2 @1 :[place], [place] [facility] [place]

## 2016-11-01 ENCOUNTER — Ambulatory Visit: Payer: Medicare Other

## 2016-11-01 ENCOUNTER — Telehealth: Payer: Self-pay | Admitting: *Deleted

## 2016-11-01 DIAGNOSIS — C50411 Malignant neoplasm of upper-outer quadrant of right female breast: Secondary | ICD-10-CM | POA: Diagnosis not present

## 2016-11-01 DIAGNOSIS — Z510 Encounter for antineoplastic radiation therapy: Secondary | ICD-10-CM | POA: Diagnosis not present

## 2016-11-01 DIAGNOSIS — C782 Secondary malignant neoplasm of pleura: Secondary | ICD-10-CM | POA: Diagnosis not present

## 2016-11-01 DIAGNOSIS — Z170 Estrogen receptor positive status [ER+]: Secondary | ICD-10-CM | POA: Diagnosis not present

## 2016-11-01 DIAGNOSIS — C7951 Secondary malignant neoplasm of bone: Secondary | ICD-10-CM | POA: Diagnosis not present

## 2016-11-01 DIAGNOSIS — C7931 Secondary malignant neoplasm of brain: Secondary | ICD-10-CM | POA: Diagnosis not present

## 2016-11-01 NOTE — Telephone Encounter (Signed)
CALLED PATIENT TO INFORM OF MRI FOR 11-04-16- ARRIVAL TIME - 3:45 PM, NO RESTRICTIONS TO TEST, TEST TO BE @ [facility], SPOKE WITH PATIENT AND SHE IS AWARE OF THIS TEST.

## 2016-11-02 ENCOUNTER — Ambulatory Visit (HOSPITAL_COMMUNITY)
Admission: RE | Admit: 2016-11-02 | Discharge: 2016-11-02 | Disposition: A | Discharge status: Home / Self Care | Payer: Medicare Other | Source: Ambulatory Visit | Attending: Internal Medicine | Admitting: Internal Medicine

## 2016-11-02 ENCOUNTER — Ambulatory Visit: Payer: Self-pay | Admitting: Urology

## 2016-11-02 ENCOUNTER — Ambulatory Visit: Payer: Medicare Other

## 2016-11-02 ENCOUNTER — Ambulatory Visit (HOSPITAL_BASED_OUTPATIENT_CLINIC_OR_DEPARTMENT_OTHER)
Admission: RE | Admit: 2016-11-02 | Discharge: 2016-11-02 | Disposition: A | Discharge status: Home / Self Care | Payer: Medicare Other | Source: Ambulatory Visit | Attending: Internal Medicine | Admitting: Internal Medicine

## 2016-11-02 ENCOUNTER — Ambulatory Visit
Admission: RE | Admit: 2016-11-02 | Discharge: 2016-11-02 | Disposition: A | Discharge status: Home / Self Care | Payer: Medicare Other | Source: Ambulatory Visit | Attending: Radiation Oncology | Admitting: Radiation Oncology

## 2016-11-02 ENCOUNTER — Encounter (HOSPITAL_COMMUNITY): Payer: Self-pay | Admitting: Internal Medicine

## 2016-11-02 VITALS — BP 130/64 | HR 70 | Wt 135.5 lb

## 2016-11-02 DIAGNOSIS — C7931 Secondary malignant neoplasm of brain: Secondary | ICD-10-CM | POA: Diagnosis not present

## 2016-11-02 DIAGNOSIS — Z09 Encounter for follow-up examination after completed treatment for conditions other than malignant neoplasm: Secondary | ICD-10-CM | POA: Diagnosis not present

## 2016-11-02 DIAGNOSIS — C7951 Secondary malignant neoplasm of bone: Secondary | ICD-10-CM

## 2016-11-02 DIAGNOSIS — I10 Essential (primary) hypertension: Secondary | ICD-10-CM | POA: Diagnosis not present

## 2016-11-02 DIAGNOSIS — I340 Nonrheumatic mitral (valve) insufficiency: Secondary | ICD-10-CM | POA: Diagnosis not present

## 2016-11-02 DIAGNOSIS — Z510 Encounter for antineoplastic radiation therapy: Secondary | ICD-10-CM | POA: Diagnosis not present

## 2016-11-02 DIAGNOSIS — C782 Secondary malignant neoplasm of pleura: Secondary | ICD-10-CM | POA: Diagnosis not present

## 2016-11-02 DIAGNOSIS — C50911 Malignant neoplasm of unspecified site of right female breast: Secondary | ICD-10-CM | POA: Insufficient documentation

## 2016-11-02 DIAGNOSIS — E785 Hyperlipidemia, unspecified: Secondary | ICD-10-CM | POA: Diagnosis not present

## 2016-11-02 DIAGNOSIS — C50411 Malignant neoplasm of upper-outer quadrant of right female breast: Secondary | ICD-10-CM | POA: Diagnosis not present

## 2016-11-02 DIAGNOSIS — Z170 Estrogen receptor positive status [ER+]: Secondary | ICD-10-CM | POA: Diagnosis not present

## 2016-11-02 LAB — ECHOCARDIOGRAM COMPLETE
E decel time: 162 msec
E/e' ratio: 9.95
FS: 27 % — AB (ref 28–44)
IVS/LV PW RATIO, ED: 1.07
LA ID, A-P, ES: 28 mm
LA diam end sys: 28 mm
LA diam index: 1.7 cm/m2
LA vol A4C: 31.7 ml
LA vol index: 19.9 mL/m2
LA vol: 32.8 mL
LV E/e' medial: 9.95
LV E/e'average: 9.95
LV PW d: 7.98 mm — AB (ref 0.6–1.1)
LV e' LATERAL: 7.51 cm/s
LVOT SV: 52 mL
LVOT VTI: 25.7 cm
LVOT area: 2.01 cm2
LVOT diameter: 16 mm
LVOT peak grad rest: 6 mmHg
LVOT peak vel: 118 cm/s
MV Dec: 162
MV Peak grad: 2 mmHg
MV pk A vel: 87.8 m/s
MV pk E vel: 74.7 m/s
Reg peak vel: 213 cm/s
TAPSE: 19.8 mm
TDI e' lateral: 7.51
TDI e' medial: 7.18
TR max vel: 213 cm/s

## 2016-11-02 NOTE — Progress Notes (Signed)
  Echocardiogram 2D Echocardiogram has been performed.  Darlina Sicilian M 11/02/2016, 12:09 PM

## 2016-11-02 NOTE — Addendum Note (Signed)
Encounter addended by: Scarlette Calico, RN on: 11/02/2016 12:29 PM<BR>    Actions taken: Sign clinical note

## 2016-11-02 NOTE — Addendum Note (Signed)
Encounter addended by: Scarlette Calico, RN on: 11/02/2016 12:32 PM<BR>    Actions taken: Visit diagnoses modified, Order list changed, Diagnosis association updated

## 2016-11-02 NOTE — Patient Instructions (Signed)
We will contact you in 6 months to schedule your next appointment and echocardiogram  

## 2016-11-02 NOTE — Progress Notes (Signed)
Advanced Heart Failure Medication Review by a Pharmacist  Does the patient  feel that his/her medications are working for him/her?  yes  Has the patient been experiencing any side effects to the medications prescribed?  no  Does the patient measure his/her own blood pressure or blood glucose at home?  no   Does the patient have any problems obtaining medications due to transportation or finances?   no  Understanding of regimen: good Understanding of indications: good Potential of compliance: good Patient understands to avoid NSAIDs. Patient understands to avoid decongestants.  Issues to address at subsequent visits: none.    Pharmacist comments: Heather [name] is a pleasant 71 y/o female who presents without her medication list or bottles but good recollection of her medication regimen. Patient was wondering if she had to separate her zantac from any of her medication, and I told patient that that was not necessary. She endorses good adherence to her medications and had no other medication-related questions or concerns for me at this time.   [name] PharmD Candidate  Time with patient: 10 minutes Preparation and documentation time: 10 minutes Total time: 20 minutes

## 2016-11-02 NOTE — Progress Notes (Signed)
CARDIO-ONCOLOGY CLINIC NOTE  Patient ID: Heather [name], female   DOB: 1945/12/20, 71 y.o.   MRN: [id number]  Referring Oncologist: Dr [name] Oncologistt: Dr [name] PCP: Dr [name] General Surgeon: Dr Marlou Starks  HPI: Heather [name] is a 71 year old female with invasive ductal carcinoma in R breast that is ER positive and HER-2/neu positive,  S/P R mastectomy and lymph node dissection 05/10/12.    She has been treated for HTN in the past but stopped taking the medications due to elevated renal function. Intolerant Ace-I due to cough.    She received a total of 6 cycles of Taxotere/carboplatin starting on 06/14/2012 and finishing in May. XRT finished in August 2014. Now receiving Herceptin every 3 weeks to end at the end of February 2015.    She finished initial Herceptin 2015 but unfortunately then developed pathological vertebral fracture with cord compression and paralysis. Now back on Herceptin since 12/16.  Found to have metastatic lesion to left femur. Underwent XRT.   Subsequently found to have a met to her 8th rib. Perjeta added to Herceptin starting in March. Also getting Faslodex injections.   Doing ok. Having some neuropathic pain around ribs. Able to walk with cane. No HF symptoms.   SUMMARY OF ONCOLOGIC HISTORY:    Primary cancer of upper outer quadrant of right female breast ([place])   03/08/2012 Initial Diagnosis    Right breast invasive ductal carcinoma ER positive PR negative HER-2 positive Ki-67 44%; another breast mass biopsied in the anterior part of the breast which was also positive for malignancy that was HER-2 negative      05/10/2012 Surgery    Right mastectomy and axillary lymph node dissection: Multifocal disease 5 cm, 1.7 cm, 1.6 cm, ER positive PR negative HER-2 positive Ki-67 44%, 3/7 lymph nodes positive      06/14/2012 - 06/12/2013 Chemotherapy    Adjuvant chemotherapy with [place] 6 followed by Herceptin maintenance      10/25/2012 - 12/11/2012  Radiation Therapy    Adjuvant radiation therapy      02/07/2013 -  Anti-estrogen oral therapy    Letrozole 2.5 mg daily      02/14/2015 Imaging    MRI spine: Large destructive T5 lesion with severe pathologic compression fracture and extensive epidural tumor, severe spinal stenosis with moderate cord compression, tumor involvement of T4      03/18/2015 PET scan    Residual enhancing soft tissue adjacent to the spinal cord. No evidence of metastatic disease. Nonspecific uptake the left nipple      03/27/2015 - 03/30/2015 Hospital Admission    T4-6 decompression for spinal cord compression (lower extremity paralysis)      04/10/2015 -  Chemotherapy    Palliative treatment with Herceptin every 3 weeks with letrozole 2.5 mg daily      04/13/2015 - 05/19/2015 Radiation Therapy    Palliative radiation treatment to the spine      09/01/2015 Imaging    CT chest abdomen pelvis: Pathologic fracture with posterior fusion at T5 no other evidence of metastatic disease in the chest abdomen pelvis      12/23/2015 Imaging    CT CAP: new nonspecific 0.6 cm lymph node was stated mediastinum needs follow-up CT, innumerable tiny groundglass pulmonary nodules throughout both lungs unchanged, patchy consolidation from radiation, T5 fracture, no mets      04/15/2016 PET scan    Rt para-spinal mass mildy hypermetabolic SUV 5.9, lytic cortical lesion 4.8 cm lesion left prox femur suv 8.8 favor osseus  met disease      04/28/2016 - 05/12/2016 Radiation Therapy    Palliative XRT Left femur      05/24/2016 Procedure    Soft tissue mass biopsy back: Metastatic breast cancer ER 80%, PR 0%, Ki-67 50%, HER-2 positive ratio 2.25      06/09/2016 -  Anti-estrogen oral therapy    Faslodex with Herceptin and Perjeta every 4 weeks      06/13/2016 - 06/15/2016 Hospital Admission    uncomplicated revision of previous thoracic instrumentation.         03/28/12 ECHO 60-65% Lateral S' 9.3 09/17/12 ECHO 55% lateral s' 9.4 03/05/13 ECHO EF 55% lateral s' 11.1 1/15 ECHO EF 60-65%, lateral s' 11.3, global longitudinal strain -22.9% 8/15 Echo EF 60% lateral s 11.2, GLS -25% 10/07/2015  ECHO EF 55-60% laterals s' 8.7 cm/s GLS -21.1% mild MR. Grade I DD 01/07/2016 Echo EF 55-60% GLS -21.2% Lateral s' 10.2 cm/s Mild MR/TR Grade I DD  04/29/16 Echo EF 60-65% lateral s' 9.6 cm/s GLS -17.9% (but poor tracking) If use 2 images with good tracking it is (21.4%)  11/02/16 Echo EF 60-65% GLS -22.13 Lat s' 10.7  Labs (1/15): K 3.9, creatinine 1.0 Labs  (7/15) K 4.2 Cr 1.2  Past Medical History:  Diagnosis Date  . Anemia    hx of  . Anxiety    r/t updated surgery  . Breast cancer ([place])    right  . Cataract    immature-not sure of which eye  . Complication of anesthesia    pt states she is sensitive to meds  . Dizziness    has been going on for 19month;medical MD aware  . Genetic testing 07/11/2016   Test Results: Normal; No pathogenic mutations detected  Genes Analyzed: 43 genes on [employer] Common Cancers panel (APC, ATM, AXIN2, BARD1, BMPR1A, BRCA1, BRCA2, BRIP1, CDH1, CDKN2A, CHEK2, DICER1, EPCAM, GREM1, HOXB13, KIT, MEN1, MLH1, MSH2, MSH6, MUTYH, NBN, NF1, PALB2, PDGFRA, PMS2, POLD1, POLE, PTEN, RAD50, RAD51C, RAD51D, SDHA, SDHB, SDHC, SDHD, SMAD4, SMARCA4, STK11, TP53, TSC1, TSC2, VHL).  .Marland KitchenGERD (gastroesophageal reflux disease)    Tums prn  . H. pylori infection   . H/O hiatal hernia   . Hemorrhoids   . History of UTI   . Hx of radiation therapy 10/25/12- 12/11/12   right chest wall/regional lymph nodes 5040 cGy, 28 sessions, right chest wall boost 1000 cGy 1 session  . Hx: UTI (urinary tract infection)   . Hyperlipidemia    but not on meds;diet and exercise controlled  . Hypertension    recently started Aldactone   . Insomnia    takes Melatoniin daily  . Metastasis to spinal column ([facility])    T5, Left  Femur cancer- radiation   .  Neuropathy    "from back surgery"  . PONV (postoperative nausea and vomiting)    pt experienced hair loss, confusion and combative- 02/15/15  . Right knee pain   . Right shoulder pain   . Skin cancer    squamous, Nodule to back - "same cancer as breast."  . Urinary frequency    d/t taking Aldactone    Current Outpatient Prescriptions  Medication Sig Dispense Refill  . ALPRAZolam (XANAX) 0.25 MG tablet Take 1 tablet by mouth as needed.    .Marland Kitchenb complex vitamins tablet Take 1 tablet by mouth daily.    . Calcium Carbonate Antacid (TUMS PO) Take 2 tablets by mouth 2 (two) times daily as needed (acid reflux).    .Marland Kitchen  Cholecalciferol (VITAMIN D) 2000 UNITS tablet Take 2,000 Units by mouth daily.    . Coenzyme Q10 (COQ10) 100 MG CAPS Take 100 mg by mouth as needed. Days that she takes pravastatin MWF.    Marland Kitchen fluticasone (FLONASE) 50 MCG/ACT nasal spray Place 1 spray into both nostrils daily as needed.    . gabapentin (NEURONTIN) 600 MG tablet Take 1 tablet (600 mg total) by mouth as directed. Take 1 tablet in the AM and afternoon and 1 1/2 tabs at bedtime 315 tablet 2  . HYDROcodone-acetaminophen (NORCO/VICODIN) 5-325 MG tablet     . lidocaine-prilocaine (EMLA) cream Apply to affected area once (Patient taking differently: Apply 1 application topically every 30 (thirty) days. Apply to port prior to infusions) 30 g 3  . OVER THE COUNTER MEDICATION Place 1 drop into both eyes 2 (two) times daily as needed (dry eyes). Over the counter lubricating eye drops    . pravastatin (PRAVACHOL) 10 MG tablet Take 10 mg by mouth every Monday, Wednesday, and Friday.     . Probiotic Product (PROBIOTIC DAILY PO) Take 1 capsule by mouth daily.     . ranitidine (ZANTAC) 300 MG tablet Take 300 mg by mouth at bedtime. Reported on 06/25/2015    . spironolactone (ALDACTONE) 25 MG tablet Take 25 mg by mouth daily.    Marland Kitchen tiZANidine (ZANAFLEX) 2 MG tablet Take 1 tablet by mouth daily.     No current facility-administered  medications for this encounter.      Allergies  Allergen Reactions  . Other Itching    Chlorhexadine Cloth wipes  . Acyclovir And Related   . Codeine     " loopy".   02/26/15: Also patient does not want to take oxycodone either (makes her feel like a "zombie")  . Keflex [Cephalexin]     GI Upset  . Naprosyn [Naproxen] Other (See Comments)    GI ipset   . Oxycodone Other (See Comments)    Made her feel like a "zombie" 02/25/15. Patient does not want to take oxycodone.  . Tramadol Itching  . Tussin [Guaifenesin] Other (See Comments)    Dizzy     Social History   Social History  . Marital status: Married    Spouse name: N/A  . Number of children: 2  . Years of education: N/A   Occupational History  . Not on file.   Social History Main Topics  . Smoking status: Never Smoker  . Smokeless tobacco: Never Used  . Alcohol use No  . Drug use: No  . Sexual activity: Yes    Birth control/ protection: Post-menopausal   Other Topics Concern  . Not on file   Social History Narrative  . No narrative on file    Family History  Problem Relation Age of Onset  . Leukemia Mother 67       deceased 25  . Stroke Father   . Diabetes Mellitus II Father   . Prostate cancer Brother 29       currently 81  . Stomach cancer Maternal Grandmother 75       deceased 71  . Prostate cancer Brother 68       currently 19    PHYSICAL EXAM: Vitals:   11/02/16 1150  BP: 130/64  Pulse: 70   General:  Well appearing. No resp difficulty HEENT: normal Neck: supple. no JVD. Carotids 2+ bilat; no bruits. No lymphadenopathy or thryomegaly appreciated. Cor: PMI nondisplaced. Regular rate & rhythm. No rubs, gallops or  murmurs. R port-a-cath Lungs: clear Abdomen: soft, nontender, nondistended. No hepatosplenomegaly. No bruits or masses. Good bowel sounds. Extremities: no cyanosis, clubbing, rash, trace edema Neuro: alert & orientedx3, cranial nerves grossly intact. moves all 4 extremities w/o  difficulty. Affect pleasant   ASSESSMENT & PLAN: 1. R breast CA with bone mets -initial Herceptin completed in 2015. -Palliative Herceptin start 12/16. Recently added Perjeta in 3/18 - I reviewed echos personally. EF and Doppler parameters stable. No HF on exam. Continue Herceptin.  - No signs of cardio-toxicity will extend screening interval to every 6 months, 2. HTN:  -BP mildly elevated lately. PCP increased spiro to 25 daily. Watch K. Can add amlodipine as needed.   Gabrielle Mester,MD 12:13 PM

## 2016-11-03 ENCOUNTER — Ambulatory Visit
Admission: RE | Admit: 2016-11-03 | Discharge: 2016-11-03 | Disposition: A | Discharge status: Home / Self Care | Payer: Medicare Other | Source: Ambulatory Visit | Attending: Radiation Oncology | Admitting: Radiation Oncology

## 2016-11-03 DIAGNOSIS — Z510 Encounter for antineoplastic radiation therapy: Secondary | ICD-10-CM | POA: Diagnosis not present

## 2016-11-03 DIAGNOSIS — C50411 Malignant neoplasm of upper-outer quadrant of right female breast: Secondary | ICD-10-CM | POA: Diagnosis not present

## 2016-11-03 DIAGNOSIS — C782 Secondary malignant neoplasm of pleura: Secondary | ICD-10-CM | POA: Diagnosis not present

## 2016-11-03 DIAGNOSIS — Z170 Estrogen receptor positive status [ER+]: Secondary | ICD-10-CM | POA: Diagnosis not present

## 2016-11-03 DIAGNOSIS — C7931 Secondary malignant neoplasm of brain: Secondary | ICD-10-CM | POA: Diagnosis not present

## 2016-11-03 DIAGNOSIS — C7951 Secondary malignant neoplasm of bone: Secondary | ICD-10-CM | POA: Diagnosis not present

## 2016-11-04 ENCOUNTER — Ambulatory Visit (HOSPITAL_COMMUNITY)
Admission: RE | Admit: 2016-11-04 | Discharge: 2016-11-04 | Disposition: A | Discharge status: Home / Self Care | Payer: Medicare Other | Source: Ambulatory Visit | Attending: Radiation Oncology | Admitting: Radiation Oncology

## 2016-11-04 ENCOUNTER — Ambulatory Visit
Admission: RE | Admit: 2016-11-04 | Discharge: 2016-11-04 | Disposition: A | Discharge status: Home / Self Care | Payer: Medicare Other | Source: Ambulatory Visit | Attending: Radiation Oncology | Admitting: Radiation Oncology

## 2016-11-04 DIAGNOSIS — G9389 Other specified disorders of brain: Secondary | ICD-10-CM | POA: Insufficient documentation

## 2016-11-04 DIAGNOSIS — C782 Secondary malignant neoplasm of pleura: Secondary | ICD-10-CM | POA: Diagnosis not present

## 2016-11-04 DIAGNOSIS — C50411 Malignant neoplasm of upper-outer quadrant of right female breast: Secondary | ICD-10-CM | POA: Diagnosis not present

## 2016-11-04 DIAGNOSIS — R519 Headache, unspecified: Secondary | ICD-10-CM

## 2016-11-04 DIAGNOSIS — Z170 Estrogen receptor positive status [ER+]: Secondary | ICD-10-CM | POA: Diagnosis not present

## 2016-11-04 DIAGNOSIS — R51 Headache: Secondary | ICD-10-CM | POA: Insufficient documentation

## 2016-11-04 DIAGNOSIS — C7951 Secondary malignant neoplasm of bone: Secondary | ICD-10-CM | POA: Insufficient documentation

## 2016-11-04 DIAGNOSIS — Z510 Encounter for antineoplastic radiation therapy: Secondary | ICD-10-CM | POA: Diagnosis not present

## 2016-11-04 DIAGNOSIS — C7931 Secondary malignant neoplasm of brain: Secondary | ICD-10-CM | POA: Diagnosis not present

## 2016-11-04 MED ORDER — GADOBENATE DIMEGLUMINE 529 MG/ML IV SOLN
15.0000 mL | Freq: Once | INTRAVENOUS | Status: AC | PRN
  Administered 2016-11-04: 12 mL via INTRAVENOUS

## 2016-11-07 ENCOUNTER — Ambulatory Visit
Admission: RE | Admit: 2016-11-07 | Discharge: 2016-11-07 | Disposition: A | Discharge status: Home / Self Care | Payer: Medicare Other | Source: Ambulatory Visit | Attending: Radiation Oncology | Admitting: Radiation Oncology

## 2016-11-07 ENCOUNTER — Telehealth: Payer: Self-pay | Admitting: Radiation Oncology

## 2016-11-07 DIAGNOSIS — C50411 Malignant neoplasm of upper-outer quadrant of right female breast: Secondary | ICD-10-CM | POA: Diagnosis not present

## 2016-11-07 DIAGNOSIS — C7951 Secondary malignant neoplasm of bone: Secondary | ICD-10-CM | POA: Diagnosis not present

## 2016-11-07 DIAGNOSIS — Z170 Estrogen receptor positive status [ER+]: Secondary | ICD-10-CM | POA: Diagnosis not present

## 2016-11-07 DIAGNOSIS — C7931 Secondary malignant neoplasm of brain: Secondary | ICD-10-CM | POA: Diagnosis not present

## 2016-11-07 DIAGNOSIS — Z510 Encounter for antineoplastic radiation therapy: Secondary | ICD-10-CM | POA: Diagnosis not present

## 2016-11-07 DIAGNOSIS — C782 Secondary malignant neoplasm of pleura: Secondary | ICD-10-CM | POA: Diagnosis not present

## 2016-11-07 NOTE — Telephone Encounter (Signed)
I spoke with the patient regarding her MRI and need for 3T mri as well offering SRS per our conversation in conference today.

## 2016-11-08 ENCOUNTER — Other Ambulatory Visit: Payer: Self-pay

## 2016-11-08 ENCOUNTER — Ambulatory Visit
Admission: RE | Admit: 2016-11-08 | Discharge: 2016-11-08 | Disposition: A | Discharge status: Home / Self Care | Payer: Medicare Other | Source: Ambulatory Visit | Attending: Radiation Oncology | Admitting: Radiation Oncology

## 2016-11-08 ENCOUNTER — Other Ambulatory Visit: Payer: Self-pay | Admitting: Radiation Therapy

## 2016-11-08 DIAGNOSIS — C782 Secondary malignant neoplasm of pleura: Secondary | ICD-10-CM | POA: Diagnosis not present

## 2016-11-08 DIAGNOSIS — C50411 Malignant neoplasm of upper-outer quadrant of right female breast: Secondary | ICD-10-CM

## 2016-11-08 DIAGNOSIS — C7951 Secondary malignant neoplasm of bone: Secondary | ICD-10-CM | POA: Diagnosis not present

## 2016-11-08 DIAGNOSIS — C7931 Secondary malignant neoplasm of brain: Secondary | ICD-10-CM | POA: Diagnosis not present

## 2016-11-08 DIAGNOSIS — Z170 Estrogen receptor positive status [ER+]: Secondary | ICD-10-CM | POA: Diagnosis not present

## 2016-11-08 DIAGNOSIS — C7949 Secondary malignant neoplasm of other parts of nervous system: Secondary | ICD-10-CM

## 2016-11-08 DIAGNOSIS — Z510 Encounter for antineoplastic radiation therapy: Secondary | ICD-10-CM | POA: Diagnosis not present

## 2016-11-08 NOTE — Progress Notes (Signed)
[facility] Counseling Note  Heather [name], Heather [name] 5 65 minutes 11/08/2016  Fifth individual session with patient. Counselor opened session with inquiry into Heather [name]'s week. Client shared results of MRI; a small spot was found on her brain. Counselor and pt discussed the emotional impact of this result and how pt is coping. Pt shared that she has only discussed the results of the scan with her husband; she wants to wait until the next, more detailed MRI before she informs them. Counselor and pt discussed the emotional toll cancer is taking on her, especially her experience of anger. Counselor normalized and validated pt's experiences. Pt and counselor also discussed pt's feelings of powerlessness over cancer. Counselor shared "[employer] Rights for Cancer Patients" worksheet with Heather [name] and processed it together. Counselor closed with scheduling: 7/9 at 1p. Counselor and pt will begin to discuss termination of counseling sessions and transitioning to a new counseling intern in the fall.

## 2016-11-10 ENCOUNTER — Ambulatory Visit (HOSPITAL_BASED_OUTPATIENT_CLINIC_OR_DEPARTMENT_OTHER): Payer: Medicare Other | Admitting: Hematology and Oncology

## 2016-11-10 ENCOUNTER — Other Ambulatory Visit (HOSPITAL_BASED_OUTPATIENT_CLINIC_OR_DEPARTMENT_OTHER): Payer: Medicare Other

## 2016-11-10 ENCOUNTER — Ambulatory Visit: Payer: Medicare Other

## 2016-11-10 ENCOUNTER — Ambulatory Visit
Admission: RE | Admit: 2016-11-10 | Discharge: 2016-11-10 | Disposition: A | Discharge status: Home / Self Care | Payer: Medicare Other | Source: Ambulatory Visit | Attending: Radiation Oncology | Admitting: Radiation Oncology

## 2016-11-10 ENCOUNTER — Encounter: Payer: Self-pay | Admitting: Hematology and Oncology

## 2016-11-10 ENCOUNTER — Ambulatory Visit (HOSPITAL_BASED_OUTPATIENT_CLINIC_OR_DEPARTMENT_OTHER): Payer: Medicare Other

## 2016-11-10 DIAGNOSIS — C50411 Malignant neoplasm of upper-outer quadrant of right female breast: Secondary | ICD-10-CM

## 2016-11-10 DIAGNOSIS — C773 Secondary and unspecified malignant neoplasm of axilla and upper limb lymph nodes: Secondary | ICD-10-CM | POA: Diagnosis not present

## 2016-11-10 DIAGNOSIS — C7951 Secondary malignant neoplasm of bone: Secondary | ICD-10-CM

## 2016-11-10 DIAGNOSIS — C7931 Secondary malignant neoplasm of brain: Secondary | ICD-10-CM | POA: Diagnosis not present

## 2016-11-10 DIAGNOSIS — C50911 Malignant neoplasm of unspecified site of right female breast: Secondary | ICD-10-CM

## 2016-11-10 DIAGNOSIS — Z170 Estrogen receptor positive status [ER+]: Secondary | ICD-10-CM

## 2016-11-10 DIAGNOSIS — Z5111 Encounter for antineoplastic chemotherapy: Secondary | ICD-10-CM | POA: Diagnosis not present

## 2016-11-10 DIAGNOSIS — C782 Secondary malignant neoplasm of pleura: Secondary | ICD-10-CM | POA: Diagnosis not present

## 2016-11-10 DIAGNOSIS — Z510 Encounter for antineoplastic radiation therapy: Secondary | ICD-10-CM | POA: Diagnosis not present

## 2016-11-10 DIAGNOSIS — Z5112 Encounter for antineoplastic immunotherapy: Secondary | ICD-10-CM | POA: Diagnosis not present

## 2016-11-10 LAB — CBC WITH DIFFERENTIAL/PLATELET
BASO%: 0.4 % (ref 0.0–2.0)
Basophils Absolute: 0 10*3/uL (ref 0.0–0.1)
EOS%: 1.1 % (ref 0.0–7.0)
Eosinophils Absolute: 0.1 10*3/uL (ref 0.0–0.5)
HCT: 38.9 % (ref 34.8–46.6)
HGB: 12.4 g/dL (ref 11.6–15.9)
LYMPH%: 11.1 % — ABNORMAL LOW (ref 14.0–49.7)
MCH: 25.8 pg (ref 25.1–34.0)
MCHC: 31.9 g/dL (ref 31.5–36.0)
MCV: 81 fL (ref 79.5–101.0)
MONO#: 0.3 10*3/uL (ref 0.1–0.9)
MONO%: 7.2 % (ref 0.0–14.0)
NEUT#: 3.8 10*3/uL (ref 1.5–6.5)
NEUT%: 80.2 % — ABNORMAL HIGH (ref 38.4–76.8)
Platelets: 178 10*3/uL (ref 145–400)
RBC: 4.8 10*6/uL (ref 3.70–5.45)
RDW: 15.5 % — ABNORMAL HIGH (ref 11.2–14.5)
WBC: 4.7 10*3/uL (ref 3.9–10.3)
lymph#: 0.5 10*3/uL — ABNORMAL LOW (ref 0.9–3.3)

## 2016-11-10 LAB — COMPREHENSIVE METABOLIC PANEL
ALT: 21 U/L (ref 0–55)
AST: 28 U/L (ref 5–34)
Albumin: 4 g/dL (ref 3.5–5.0)
Alkaline Phosphatase: 74 U/L (ref 40–150)
Anion Gap: 9 mEq/L (ref 3–11)
BUN: 15.2 mg/dL (ref 7.0–26.0)
CO2: 26 mEq/L (ref 22–29)
Calcium: 10 mg/dL (ref 8.4–10.4)
Chloride: 101 mEq/L (ref 98–109)
Creatinine: 0.8 mg/dL (ref 0.6–1.1)
EGFR: 78 mL/min/{1.73_m2} — ABNORMAL LOW (ref >90)
Glucose: 102 mg/dl (ref 70–140)
Potassium: 4 mEq/L (ref 3.5–5.1)
Sodium: 136 mEq/L (ref 136–145)
Total Bilirubin: 0.52 mg/dL (ref 0.20–1.20)
Total Protein: 7.2 g/dL (ref 6.4–8.3)

## 2016-11-10 MED ORDER — HEPARIN SOD (PORK) LOCK FLUSH 100 UNIT/ML IV SOLN
500.0000 [IU] | Freq: Once | INTRAVENOUS | Status: AC | PRN
  Administered 2016-11-10: 500 [IU]
  Filled 2016-11-10: qty 5

## 2016-11-10 MED ORDER — ACETAMINOPHEN 325 MG PO TABS
650.0000 mg | ORAL_TABLET | Freq: Once | ORAL | Status: AC
  Administered 2016-11-10: 650 mg via ORAL

## 2016-11-10 MED ORDER — SODIUM CHLORIDE 0.9 % IJ SOLN
3.0000 mL | Freq: Once | INTRAMUSCULAR | Status: DC | PRN
  Filled 2016-11-10: qty 10

## 2016-11-10 MED ORDER — FULVESTRANT 250 MG/5ML IM SOLN
500.0000 mg | Freq: Once | INTRAMUSCULAR | Status: AC
  Administered 2016-11-10: 500 mg via INTRAMUSCULAR
  Filled 2016-11-10: qty 10

## 2016-11-10 MED ORDER — SODIUM CHLORIDE 0.9 % IJ SOLN
10.0000 mL | INTRAMUSCULAR | Status: DC | PRN
  Administered 2016-11-10: 10 mL
  Filled 2016-11-10: qty 10

## 2016-11-10 MED ORDER — SODIUM CHLORIDE 0.9 % IV SOLN
Freq: Once | INTRAVENOUS | Status: AC
  Administered 2016-11-10: 12:00:00 via INTRAVENOUS

## 2016-11-10 MED ORDER — DIPHENHYDRAMINE HCL 25 MG PO CAPS
ORAL_CAPSULE | ORAL | Status: AC
  Filled 2016-11-10: qty 2

## 2016-11-10 MED ORDER — ALTEPLASE 2 MG IJ SOLR
2.0000 mg | Freq: Once | INTRAMUSCULAR | Status: DC | PRN
  Filled 2016-11-10: qty 2

## 2016-11-10 MED ORDER — SODIUM CHLORIDE 0.9 % IV SOLN
420.0000 mg | Freq: Once | INTRAVENOUS | Status: AC
  Administered 2016-11-10: 420 mg via INTRAVENOUS
  Filled 2016-11-10: qty 14

## 2016-11-10 MED ORDER — ACETAMINOPHEN 325 MG PO TABS
ORAL_TABLET | ORAL | Status: AC
  Filled 2016-11-10: qty 2

## 2016-11-10 MED ORDER — HEPARIN SOD (PORK) LOCK FLUSH 100 UNIT/ML IV SOLN
500.0000 [IU] | Freq: Once | INTRAVENOUS | Status: DC | PRN
  Filled 2016-11-10: qty 5

## 2016-11-10 MED ORDER — ZOLEDRONIC ACID 4 MG/100ML IV SOLN
4.0000 mg | Freq: Once | INTRAVENOUS | Status: AC
  Administered 2016-11-10: 4 mg via INTRAVENOUS
  Filled 2016-11-10: qty 100

## 2016-11-10 MED ORDER — SODIUM CHLORIDE 0.9% FLUSH
10.0000 mL | INTRAVENOUS | Status: DC | PRN
  Administered 2016-11-10: 10 mL
  Filled 2016-11-10: qty 10

## 2016-11-10 MED ORDER — SODIUM CHLORIDE 0.9 % IV SOLN: Freq: Once | INTRAVENOUS | Status: DC

## 2016-11-10 MED ORDER — DIPHENHYDRAMINE HCL 25 MG PO CAPS
50.0000 mg | ORAL_CAPSULE | Freq: Once | ORAL | Status: AC
  Administered 2016-11-10: 50 mg via ORAL

## 2016-11-10 MED ORDER — HEPARIN SOD (PORK) LOCK FLUSH 100 UNIT/ML IV SOLN
250.0000 [IU] | Freq: Once | INTRAVENOUS | Status: DC | PRN
  Filled 2016-11-10: qty 5

## 2016-11-10 MED ORDER — TRASTUZUMAB CHEMO 150 MG IV SOLR
6.0000 mg/kg | Freq: Once | INTRAVENOUS | Status: AC
  Administered 2016-11-10: 378 mg via INTRAVENOUS
  Filled 2016-11-10: qty 18

## 2016-11-10 NOTE — Assessment & Plan Note (Signed)
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 MRI 02/14/2015: T5 large destructive lesion with pathologic compression fracture and extensive epidural tumor, involvement of T4, excision metastatic carcinoma ER 60%, PR 0%, HER-2 positive ratio 2.71 average copy #7.45 03/27/2015: T4-6 decompression surgery for spinal cord compression PET-CT 12/10/17Rt para-spinal mass mildy hypermetabolic SUV 5.9, lytic cortical lesion 4.8 cm lesion left prox femur suv 8.8 favor osseus met disease   Treatment summary: 1. Resumed anastrozole 05/21/2015, but it has caused significant hair loss so we switched her to exemestane 25 mg once daily 07/23/2015 2. Herceptin every 3 week started 04/10/2015 3. Bone metastases: Zometa every 3 months. 4. Radiation therapy to the spine (Dr. [name]) 04/13/2015 to 05/19/2015 5. Radiation therapy to left femur 04/28/2016 to 05/12/2016  -------------------------------------------------------------------------------------------------------------------------- Goal of treatment: Palliation Treatment plan:Faslodex along with Herceptin and Perjeta first dose of Faslodex given 06/09/2016 every 4 weeks  PET/CT scan 10/10/2016: Interval development of new hypermetabolic 2.5 cm lesion in the posterior right eighth rib, stable right paraspinal lesion at T8, clustered soft tissue nodules decreased uptake compared to December  Brain MRI: 11/04/2016:Solitary contrast-enhancing lesion in the right cerebellum 6 x 2 mm concerning for metastatic lesion  Treatment plan: SRS to the brain lesion Continue with current treatment for 3 more months before and other scan

## 2016-11-10 NOTE — Progress Notes (Signed)
Patient Care Team: [name], MD as PCP - General (Family Medicine)  DIAGNOSIS:  Encounter Diagnosis  Name Primary?  . Primary cancer of upper outer quadrant of right female breast ([name])     SUMMARY OF ONCOLOGIC HISTORY:   Primary cancer of upper outer quadrant of right female breast ([name])   03/08/2012 Initial Diagnosis    Right breast invasive ductal carcinoma ER positive PR negative HER-2 positive Ki-67 44%; another breast mass biopsied in the anterior part of the breast which was also positive for malignancy that was HER-2 negative      05/10/2012 Surgery    Right mastectomy and axillary lymph node dissection: Multifocal disease 5 cm, 1.7 cm, 1.6 cm, ER positive PR negative HER-2 positive Ki-67 44%, 3/7 lymph nodes positive      06/14/2012 - 06/12/2013 Chemotherapy    Adjuvant chemotherapy with [place] 6 followed by Herceptin maintenance      10/25/2012 - 12/11/2012 Radiation Therapy    Adjuvant radiation therapy      02/07/2013 -  Anti-estrogen oral therapy    Letrozole 2.5 mg daily      02/14/2015 Imaging    MRI spine: Large destructive T5 lesion with severe pathologic compression fracture and extensive epidural tumor, severe spinal stenosis with moderate cord compression, tumor involvement of T4      03/18/2015 PET scan    Residual enhancing soft tissue adjacent to the spinal cord. No evidence of metastatic disease. Nonspecific uptake the left nipple      03/27/2015 - 03/30/2015 Hospital Admission    T4-6 decompression for spinal cord compression (lower extremity paralysis)      04/10/2015 -  Chemotherapy    Palliative treatment with Herceptin every 3 weeks with letrozole 2.5 mg daily      04/13/2015 - 05/19/2015 Radiation Therapy    Palliative radiation treatment to the spine      09/01/2015 Imaging    CT chest abdomen pelvis: Pathologic fracture with posterior fusion at T5 no other evidence of metastatic disease in the chest abdomen pelvis      12/23/2015 Imaging      CT CAP: new nonspecific 0.6 cm lymph node was stated mediastinum needs follow-up CT, innumerable tiny groundglass pulmonary nodules throughout both lungs unchanged, patchy consolidation from radiation, T5 fracture, no mets      04/15/2016 PET scan    Rt para-spinal mass mildy hypermetabolic SUV 5.9, lytic cortical lesion 4.8 cm lesion left prox femur suv 8.8 favor osseus met disease      04/28/2016 - 05/12/2016 Radiation Therapy    Palliative XRT Left femur      05/24/2016 Procedure    Soft tissue mass biopsy back: Metastatic breast cancer ER 80%, PR 0%, Ki-67 50%, HER-2 positive ratio 2.25      06/09/2016 -  Anti-estrogen oral therapy    Faslodex with Herceptin and Perjeta every 4 weeks      06/13/2016 - 06/15/2016 Hospital Admission    uncomplicated revision of previous thoracic instrumentation.      10/10/2016 PET scan    Interval development of new hypermetabolic 2.5 x 1.1 cm lesion posterior right eighth rib consistent with metastatic disease, stable right paraspinal lesion at T8, clustered soft tissue nodules in the posterior midline back near cervicothoracic junction smaller than before       11/03/2016 - 11/07/2016 Radiation Therapy    Palliative radiation to right eighth rib      11/04/2016 Imaging    Solitary contrast-enhancing lesion in the right  cerebellum 6 x 2 mm concerning for metastatic lesion       CHIEF COMPLIANT: Follow-up after completion of palliative radiation to the rib and after undergoing brain MRI  INTERVAL HISTORY: Heather [name] is a 71 year old with above-mentioned symptoms metastatic breast cancer currently on Faslodex Herceptin and Perjeta. She was noted to have progression of the rib which was radiated. She underwent a brain MRI that showed a new lesion in the cerebellum. She is being set up to undergo stereotactic radiosurgery to that lesion as well. She is here to talk about the recently performed brain MRI scan. From the lower extremity weakness and  paralysis standpoint she is slowly starting to get stronger again.  REVIEW OF SYSTEMS:   Constitutional: Denies fevers, chills or abnormal weight loss Eyes: Denies blurriness of vision Ears, nose, mouth, throat, and face: Denies mucositis or sore throat Respiratory: Denies cough, dyspnea or wheezes Cardiovascular: Denies palpitation, chest discomfort Gastrointestinal:  Denies nausea, heartburn or change in bowel habits Skin: Denies abnormal skin rashes Lymphatics: Denies new lymphadenopathy or easy bruising Neurological: Bilateral lower extremity weakness from previous cord compression Behavioral/Psych: Patient is emotionally drained  Extremities: No lower extremity edema Breast:  denies any pain or lumps or nodules in either breasts All other systems were reviewed with the patient and are negative.  I have reviewed the past medical history, past surgical history, social history and family history with the patient and they are unchanged from previous note.  ALLERGIES:  is allergic to other; acyclovir and related; codeine; keflex [cephalexin]; naprosyn [naproxen]; oxycodone; tramadol; and tussin [guaifenesin].  MEDICATIONS:  Current Outpatient Prescriptions  Medication Sig Dispense Refill  . ALPRAZolam (XANAX) 0.25 MG tablet Take 1 tablet by mouth as needed.    Marland Kitchen b complex vitamins tablet Take 1 tablet by mouth daily.    . Calcium Carbonate Antacid (TUMS PO) Take 2 tablets by mouth 2 (two) times daily as needed (acid reflux).    . Cholecalciferol (VITAMIN D) 2000 UNITS tablet Take 2,000 Units by mouth daily.    . Coenzyme Q10 (COQ10) 100 MG CAPS Take 100 mg by mouth as needed. Days that she takes pravastatin MWF.    Marland Kitchen fluticasone (FLONASE) 50 MCG/ACT nasal spray Place 1 spray into both nostrils daily as needed.    . gabapentin (NEURONTIN) 600 MG tablet Take 1 tablet (600 mg total) by mouth as directed. Take 1 tablet in the AM and afternoon and 1 1/2 tabs at bedtime 315 tablet 2  .  HYDROcodone-acetaminophen (NORCO/VICODIN) 5-325 MG tablet     . lidocaine-prilocaine (EMLA) cream Apply to affected area once (Patient taking differently: Apply 1 application topically every 30 (thirty) days. Apply to port prior to infusions) 30 g 3  . OVER THE COUNTER MEDICATION Place 1 drop into both eyes 2 (two) times daily as needed (dry eyes). Over the counter lubricating eye drops    . pravastatin (PRAVACHOL) 10 MG tablet Take 10 mg by mouth every Monday, Wednesday, and Friday.     . Probiotic Product (PROBIOTIC DAILY PO) Take 1 capsule by mouth daily.     . ranitidine (ZANTAC) 300 MG tablet Take 300 mg by mouth at bedtime. Reported on 06/25/2015    . spironolactone (ALDACTONE) 25 MG tablet Take 25 mg by mouth daily.    Marland Kitchen tiZANidine (ZANAFLEX) 2 MG tablet Take 1 tablet by mouth daily.     No current facility-administered medications for this visit.    Facility-Administered Medications Ordered in Other Visits  Medication Dose Route Frequency Provider Last Rate Last Dose  . 0.9 %  sodium chloride infusion   Intravenous Once [name], MD      . alteplase (CATHFLO ACTIVASE) injection 2 mg  2 mg Intracatheter Once PRN [name], MD      . fulvestrant (FASLODEX) injection 500 mg  500 mg Intramuscular Once [name], MD      . heparin lock flush 100 unit/mL  500 Units Intracatheter Once PRN [name], MD      . heparin lock flush 100 unit/mL  500 Units Intracatheter Once PRN [name], MD      . heparin lock flush 100 unit/mL  250 Units Intracatheter Once PRN [name], MD      . pertuzumab (PERJETA) 420 mg in sodium chloride 0.9 % 250 mL chemo infusion  420 mg Intravenous Once [name], MD      . sodium chloride 0.9 % injection 3 mL  3 mL Intravenous Once PRN [name], MD      . sodium chloride flush (NS) 0.9 % injection 10 mL  10 mL Intracatheter PRN [name], MD      . trastuzumab (HERCEPTIN) 378 mg in sodium chloride 0.9 % 250 mL chemo infusion  6 mg/kg  (Treatment Plan Recorded) Intravenous Once [name], MD      . Zoledronic Acid (ZOMETA) 4 mg IVPB  4 mg Intravenous Once [name], MD 400 mL/hr at 11/10/16 1226 4 mg at 11/10/16 1226    PHYSICAL EXAMINATION: ECOG PERFORMANCE STATUS: 2 - Symptomatic, <50% confined to bed  Vitals:   11/10/16 1103  BP: (!) 158/77  Pulse: 80  Resp: 18  Temp: 97.9 F (36.6 C)   Filed Weights   11/10/16 1103  Weight: 134 lb 4.8 oz (60.9 kg)    GENERAL:alert, no distress and comfortable SKIN: skin color, texture, turgor are normal, no rashes or significant lesions EYES: normal, Conjunctiva are pink and non-injected, sclera clear OROPHARYNX:no exudate, no erythema and lips, buccal mucosa, and tongue normal  NECK: supple, thyroid normal size, non-tender, without nodularity LYMPH:  no palpable lymphadenopathy in the cervical, axillary or inguinal LUNGS: clear to auscultation and percussion with normal breathing effort HEART: regular rate & rhythm and no murmurs and no lower extremity edema ABDOMEN:abdomen soft, non-tender and normal bowel sounds MUSCULOSKELETAL:no cyanosis of digits and no clubbing  NEURO: alert & oriented x 3 with fluent speech, no focal motor/sensory deficits EXTREMITIES: No lower extremity edema   LABORATORY DATA:  I have reviewed the data as listed   Chemistry      Component Value Date/Time   NA 136 11/10/2016 1015   K 4.0 11/10/2016 1015   CL 103 06/14/2016 0413   CL 101 10/25/2012 0946   CO2 26 11/10/2016 1015   BUN 15.2 11/10/2016 1015   CREATININE 0.8 11/10/2016 1015      Component Value Date/Time   CALCIUM 10.0 11/10/2016 1015   ALKPHOS 74 11/10/2016 1015   AST 28 11/10/2016 1015   ALT 21 11/10/2016 1015   BILITOT 0.52 11/10/2016 1015       Lab Results  Component Value Date   WBC 4.7 11/10/2016   HGB 12.4 11/10/2016   HCT 38.9 11/10/2016   MCV 81.0 11/10/2016   PLT 178 11/10/2016   NEUTROABS 3.8 11/10/2016    ASSESSMENT & PLAN:  Primary  cancer of upper outer quadrant of right female breast ([place]) Right breast invasive ductal carcinoma ER positive PR negative HER-2  positive Ki-67 44% multifocal disease 3/7 lymph nodes positive T2 N1 M0 stage IIB status post adjuvant chemotherapy with TCH followed by Herceptin maintenance, adjuvant radiation therapy and Letrozole. MRI 02/14/2015: T5 large destructive lesion with pathologic compression fracture and extensive epidural tumor, involvement of T4, excision metastatic carcinoma ER 60%, PR 0%, HER-2 positive ratio 2.71 average copy #7.45 03/27/2015: T4-6 decompression surgery for spinal cord compression PET-CT 12/10/17Rt para-spinal mass mildy hypermetabolic SUV 5.9, lytic cortical lesion 4.8 cm lesion left prox femur suv 8.8 favor osseus met disease   Treatment summary: 1. Resumed anastrozole 05/21/2015, but it has caused significant hair loss so we switched her to exemestane 25 mg once daily 07/23/2015 2. Herceptin every 3 week started 04/10/2015 3. Bone metastases: Zometa every 3 months. 4. Radiation therapy to the spine (Dr. [name]) 04/13/2015 to 05/19/2015 5. Radiation therapy to left femur 04/28/2016 to 05/12/2016  -------------------------------------------------------------------------------------------------------------------------- Goal of treatment: Palliation Treatment plan:Faslodex along with Herceptin and Perjeta first dose of Faslodex given 06/09/2016 every 4 weeks  PET/CT scan 10/10/2016: Interval development of new hypermetabolic 2.5 cm lesion in the posterior right eighth rib, stable right paraspinal lesion at T8, clustered soft tissue nodules decreased uptake compared to December Brain MRI: 11/04/2016:Solitary contrast-enhancing lesion in the right cerebellum 6 x 2 mm concerning for metastatic lesion  Treatment plan: SRS to the brain lesion Continue with current treatment for 3 more months before and other scan Patient will receive Herceptin Perjeta Faslodex and  Zometa today.  I spent 25 minutes talking to the patient of which more than half was spent in counseling and coordination of care.  No orders of the defined types were placed in this encounter.  The patient has a good understanding of the overall plan. she agrees with it. she will call with any problems that may develop before the next visit here.   Rulon Eisenmenger, MD 11/10/16

## 2016-11-10 NOTE — Patient Instructions (Signed)
[facility] Discharge Instructions for Patients Receiving Chemotherapy  Today you received the following chemotherapy agents Herceptin/Perjeta To help prevent nausea and vomiting after your treatment, we encourage you to take your nausea medication as prescribed.   If you develop nausea and vomiting that is not controlled by your nausea medication, call the clinic.   BELOW ARE SYMPTOMS THAT SHOULD BE REPORTED IMMEDIATELY:  *FEVER GREATER THAN 100.5 F  *CHILLS WITH OR WITHOUT FEVER  NAUSEA AND VOMITING THAT IS NOT CONTROLLED WITH YOUR NAUSEA MEDICATION  *UNUSUAL SHORTNESS OF BREATH  *UNUSUAL BRUISING OR BLEEDING  TENDERNESS IN MOUTH AND THROAT WITH OR WITHOUT PRESENCE OF ULCERS  *URINARY PROBLEMS  *BOWEL PROBLEMS  UNUSUAL RASH Items with * indicate a potential emergency and should be followed up as soon as possible.  Feel free to call the clinic you have any questions or concerns. The clinic phone number is [phone].  Please show the [facility] at check-in to the Emergency Department and triage nurse.

## 2016-11-10 NOTE — Progress Notes (Signed)
Injection of Faslodex was given in Infusion Room.

## 2016-11-10 NOTE — Patient Instructions (Signed)

## 2016-11-11 ENCOUNTER — Ambulatory Visit: Payer: Medicare Other | Admitting: Hematology and Oncology

## 2016-11-11 ENCOUNTER — Ambulatory Visit
Admission: RE | Admit: 2016-11-11 | Discharge: 2016-11-11 | Disposition: A | Discharge status: Home / Self Care | Payer: Medicare Other | Source: Ambulatory Visit | Attending: Radiation Oncology | Admitting: Radiation Oncology

## 2016-11-11 ENCOUNTER — Other Ambulatory Visit: Payer: Medicare Other

## 2016-11-11 DIAGNOSIS — Z170 Estrogen receptor positive status [ER+]: Secondary | ICD-10-CM | POA: Diagnosis not present

## 2016-11-11 DIAGNOSIS — C782 Secondary malignant neoplasm of pleura: Secondary | ICD-10-CM | POA: Diagnosis not present

## 2016-11-11 DIAGNOSIS — C7951 Secondary malignant neoplasm of bone: Secondary | ICD-10-CM | POA: Diagnosis not present

## 2016-11-11 DIAGNOSIS — C50411 Malignant neoplasm of upper-outer quadrant of right female breast: Secondary | ICD-10-CM | POA: Diagnosis not present

## 2016-11-11 DIAGNOSIS — C7931 Secondary malignant neoplasm of brain: Secondary | ICD-10-CM | POA: Diagnosis not present

## 2016-11-11 DIAGNOSIS — Z510 Encounter for antineoplastic radiation therapy: Secondary | ICD-10-CM | POA: Diagnosis not present

## 2016-11-14 ENCOUNTER — Ambulatory Visit
Admission: RE | Admit: 2016-11-14 | Discharge: 2016-11-14 | Disposition: A | Discharge status: Home / Self Care | Payer: Medicare Other | Source: Ambulatory Visit | Attending: Radiation Oncology | Admitting: Radiation Oncology

## 2016-11-14 ENCOUNTER — Ambulatory Visit: Payer: Medicare Other

## 2016-11-14 DIAGNOSIS — Z170 Estrogen receptor positive status [ER+]: Secondary | ICD-10-CM | POA: Diagnosis not present

## 2016-11-14 DIAGNOSIS — C7951 Secondary malignant neoplasm of bone: Secondary | ICD-10-CM

## 2016-11-14 DIAGNOSIS — C50411 Malignant neoplasm of upper-outer quadrant of right female breast: Secondary | ICD-10-CM | POA: Diagnosis not present

## 2016-11-14 DIAGNOSIS — I10 Essential (primary) hypertension: Secondary | ICD-10-CM | POA: Diagnosis not present

## 2016-11-14 DIAGNOSIS — C782 Secondary malignant neoplasm of pleura: Secondary | ICD-10-CM | POA: Diagnosis not present

## 2016-11-14 DIAGNOSIS — Z510 Encounter for antineoplastic radiation therapy: Secondary | ICD-10-CM | POA: Diagnosis not present

## 2016-11-14 DIAGNOSIS — C7931 Secondary malignant neoplasm of brain: Secondary | ICD-10-CM | POA: Diagnosis not present

## 2016-11-14 MED ORDER — SONAFINE EX EMUL
1.0000 "application " | Freq: Two times a day (BID) | CUTANEOUS | Status: DC
  Administered 2016-11-14: 1 via TOPICAL

## 2016-11-14 NOTE — Progress Notes (Signed)
[facility] Counseling Note  Individual session with patient. Pt presented as alert and oriented x3, but seemed distracted and tired throughout appointment. Counselor opened session with inquiry into pt's week; pt described feelings of emotional exhaustion, that she is still processing the newfound information of the spread of cancer to her brain, and dread of the next "round" of tx for this type of cancer (another MRI, radiation of her brain, etc). Pt and counselor discussed the emotional toll that this new information is taking on her, which is compounded by the effort it takes to tell her loved ones about this update. Pt stated that she is "tired of talking about [cancer]," and seemed frustrated in session that she continued to talk about cancer during session. Pt asked to end the session early due to emotional and physical exhaustion. Counselor agreed that pt needed a break. Counselor and pt closed with scheduling for next week: 7/16 at 1p.  Westly Pam, [place] [facility] [place]

## 2016-11-15 ENCOUNTER — Ambulatory Visit: Payer: Medicare Other

## 2016-11-15 ENCOUNTER — Ambulatory Visit
Admission: RE | Admit: 2016-11-15 | Discharge: 2016-11-15 | Disposition: A | Discharge status: Home / Self Care | Payer: Medicare Other | Source: Ambulatory Visit | Attending: Radiation Oncology | Admitting: Radiation Oncology

## 2016-11-15 DIAGNOSIS — Z510 Encounter for antineoplastic radiation therapy: Secondary | ICD-10-CM | POA: Diagnosis not present

## 2016-11-15 DIAGNOSIS — C7951 Secondary malignant neoplasm of bone: Secondary | ICD-10-CM | POA: Diagnosis not present

## 2016-11-15 DIAGNOSIS — C7931 Secondary malignant neoplasm of brain: Secondary | ICD-10-CM | POA: Diagnosis not present

## 2016-11-15 DIAGNOSIS — C782 Secondary malignant neoplasm of pleura: Secondary | ICD-10-CM | POA: Diagnosis not present

## 2016-11-15 DIAGNOSIS — Z170 Estrogen receptor positive status [ER+]: Secondary | ICD-10-CM | POA: Diagnosis not present

## 2016-11-15 DIAGNOSIS — C50411 Malignant neoplasm of upper-outer quadrant of right female breast: Secondary | ICD-10-CM | POA: Diagnosis not present

## 2016-11-16 ENCOUNTER — Ambulatory Visit
Admission: RE | Admit: 2016-11-16 | Discharge: 2016-11-16 | Disposition: A | Discharge status: Home / Self Care | Payer: Medicare Other | Source: Ambulatory Visit | Attending: Radiation Oncology | Admitting: Radiation Oncology

## 2016-11-16 ENCOUNTER — Ambulatory Visit (HOSPITAL_COMMUNITY)
Admission: RE | Admit: 2016-11-16 | Discharge: 2016-11-16 | Disposition: A | Discharge status: Home / Self Care | Payer: Medicare Other | Source: Ambulatory Visit | Attending: Radiation Oncology | Admitting: Radiation Oncology

## 2016-11-16 DIAGNOSIS — C7931 Secondary malignant neoplasm of brain: Secondary | ICD-10-CM | POA: Insufficient documentation

## 2016-11-16 DIAGNOSIS — C782 Secondary malignant neoplasm of pleura: Secondary | ICD-10-CM | POA: Diagnosis not present

## 2016-11-16 DIAGNOSIS — C50411 Malignant neoplasm of upper-outer quadrant of right female breast: Secondary | ICD-10-CM | POA: Diagnosis not present

## 2016-11-16 DIAGNOSIS — R51 Headache: Secondary | ICD-10-CM | POA: Diagnosis not present

## 2016-11-16 DIAGNOSIS — C7949 Secondary malignant neoplasm of other parts of nervous system: Secondary | ICD-10-CM | POA: Insufficient documentation

## 2016-11-16 DIAGNOSIS — Z510 Encounter for antineoplastic radiation therapy: Secondary | ICD-10-CM | POA: Diagnosis not present

## 2016-11-16 DIAGNOSIS — C7951 Secondary malignant neoplasm of bone: Secondary | ICD-10-CM | POA: Diagnosis not present

## 2016-11-16 DIAGNOSIS — Z170 Estrogen receptor positive status [ER+]: Secondary | ICD-10-CM | POA: Diagnosis not present

## 2016-11-16 MED ORDER — GADOBENATE DIMEGLUMINE 529 MG/ML IV SOLN
15.0000 mL | Freq: Once | INTRAVENOUS | Status: AC
  Administered 2016-11-16: 13 mL via INTRAVENOUS

## 2016-11-17 ENCOUNTER — Telehealth: Payer: Self-pay | Admitting: Radiation Therapy

## 2016-11-17 NOTE — Telephone Encounter (Signed)
Called to inform Heather [name] that her brain planning scan did not show any additional lesions that will need treatment at this time. The plan is to move forward with SRS treatment to the solitary Rt cerebellar target as originally discussed.   Ms. Monteith did not answer the phone, so this was left in a detailed message on her cell phone vm.   Sim is scheduled for 7/13 Treatment 7/18   Mont Dutton R.T. (R)(T)  Special Procedures Navigator

## 2016-11-18 ENCOUNTER — Ambulatory Visit
Admission: RE | Admit: 2016-11-18 | Discharge: 2016-11-18 | Disposition: A | Discharge status: Home / Self Care | Payer: Medicare Other | Source: Ambulatory Visit | Attending: Radiation Oncology | Admitting: Radiation Oncology

## 2016-11-18 VITALS — BP 153/77 | HR 72 | Resp 16

## 2016-11-18 DIAGNOSIS — C7931 Secondary malignant neoplasm of brain: Secondary | ICD-10-CM | POA: Diagnosis not present

## 2016-11-18 DIAGNOSIS — C782 Secondary malignant neoplasm of pleura: Secondary | ICD-10-CM | POA: Diagnosis not present

## 2016-11-18 DIAGNOSIS — C7949 Secondary malignant neoplasm of other parts of nervous system: Secondary | ICD-10-CM

## 2016-11-18 DIAGNOSIS — C7951 Secondary malignant neoplasm of bone: Secondary | ICD-10-CM | POA: Diagnosis not present

## 2016-11-18 DIAGNOSIS — Z510 Encounter for antineoplastic radiation therapy: Secondary | ICD-10-CM | POA: Diagnosis not present

## 2016-11-18 DIAGNOSIS — Z170 Estrogen receptor positive status [ER+]: Secondary | ICD-10-CM | POA: Diagnosis not present

## 2016-11-18 DIAGNOSIS — C50411 Malignant neoplasm of upper-outer quadrant of right female breast: Secondary | ICD-10-CM | POA: Diagnosis not present

## 2016-11-18 MED ORDER — SODIUM CHLORIDE 0.9% FLUSH
10.0000 mL | Freq: Once | INTRAVENOUS | Status: AC
Start: 2016-11-18 — End: 2016-11-18
  Administered 2016-11-18: 10 mL via INTRAVENOUS

## 2016-11-18 MED ORDER — HEPARIN SOD (PORK) LOCK FLUSH 100 UNIT/ML IV SOLN
500.0000 [IU] | Freq: Once | INTRAVENOUS | Status: AC
  Administered 2016-11-18: 500 [IU] via INTRAVENOUS

## 2016-11-18 NOTE — Progress Notes (Signed)
Entry from 1058 on 11/18/2016 Has armband been applied?  Yes.    Does patient have an allergy to IV contrast dye?: No.   Has patient ever received premedication for IV contrast dye?: No.   Does patient take metformin?: No.  If patient does take metformin when was the last dose: n/a  Date of lab work: 11/10/2016  BUN: 15.2 CR: 0.8  IV site: subclavian right, condition no redness  Has IV site been added to flowsheet?  Yes.    BP (!) 153/77 (BP Location: Left Arm, Patient Position: Sitting, Cuff Size: Normal)   Pulse 72   Resp 16   SpO2 100%

## 2016-11-18 NOTE — Progress Notes (Signed)
  Radiation Oncology         [phone] ________________________________  Name: Heather [name] MRN: [id number]  Date: 11/18/2016  DOB: [birth year]  SIMULATION AND TREATMENT PLANNING NOTE    ICD-10-CM   1. Brain metastasis ([place]) C79.31     DIAGNOSIS:  Heather [name] is a 71 y.o. woman with 6 mm right cerebellar brain metastasis.  NARRATIVE:  The patient was brought to the [facility].  Identity was confirmed.  All relevant records and images related to the planned course of therapy were reviewed.  The patient freely provided informed written consent to proceed with treatment after reviewing the details related to the planned course of therapy. The consent form was witnessed and verified by the simulation staff. Intravenous access was established for contrast administration. Then, the patient was set-up in a stable reproducible supine position for radiation therapy.  A relocatable thermoplastic stereotactic head frame was fabricated for precise immobilization.  CT images were obtained.  Surface markings were placed.  The CT images were loaded into the planning software and fused with the patient's targeting MRI scan.  Then the target and avoidance structures were contoured.  Treatment planning then occurred.  The radiation prescription was entered and confirmed.  I have requested 3D planning  I have requested a DVH of the following structures: Brain stem, brain, left eye, right eye, lenses, optic chiasm, target volumes, uninvolved brain, and normal tissue.    SPECIAL TREATMENT PROCEDURE:  The planned course of therapy using radiation constitutes a special treatment procedure. Special care is required in the management of this patient for the following reasons. This treatment constitutes a Special Treatment Procedure for the following reason: High dose per fraction requiring special monitoring for increased toxicities of treatment including daily imaging.  The special nature of the  planned course of radiotherapy will require increased physician supervision and oversight to ensure patient's safety with optimal treatment outcomes.  PLAN:  The patient will receive 20 Gy in 1 fraction.  ________________________________  Sheral Apley [name], M.D.  This document serves as a record of services personally performed by Tyler Pita, MD. It was created on his behalf by Rae Lips, a trained medical scribe. The creation of this record is based on the scribe's personal observations and the provider's statements to them. This document has been checked and approved by the attending provider.

## 2016-11-18 NOTE — Progress Notes (Signed)
Received patient in the clinic following [place] simulation. Flushed port per protocol. Removed huber needle. Needle intact upon removal. Applied a bandaid to old access site. Patient tolerate all very well. Patient discharged home with friend, alert & oriented, and in no distress.

## 2016-11-19 DIAGNOSIS — R350 Frequency of micturition: Secondary | ICD-10-CM | POA: Diagnosis not present

## 2016-11-19 DIAGNOSIS — N390 Urinary tract infection, site not specified: Secondary | ICD-10-CM | POA: Diagnosis not present

## 2016-11-21 ENCOUNTER — Telehealth: Payer: Self-pay

## 2016-11-21 DIAGNOSIS — C7951 Secondary malignant neoplasm of bone: Secondary | ICD-10-CM | POA: Diagnosis not present

## 2016-11-21 DIAGNOSIS — C7931 Secondary malignant neoplasm of brain: Secondary | ICD-10-CM | POA: Diagnosis not present

## 2016-11-21 DIAGNOSIS — C50411 Malignant neoplasm of upper-outer quadrant of right female breast: Secondary | ICD-10-CM | POA: Diagnosis not present

## 2016-11-21 DIAGNOSIS — Z510 Encounter for antineoplastic radiation therapy: Secondary | ICD-10-CM | POA: Diagnosis not present

## 2016-11-21 DIAGNOSIS — C782 Secondary malignant neoplasm of pleura: Secondary | ICD-10-CM | POA: Diagnosis not present

## 2016-11-21 DIAGNOSIS — Z170 Estrogen receptor positive status [ER+]: Secondary | ICD-10-CM | POA: Diagnosis not present

## 2016-11-21 NOTE — Telephone Encounter (Signed)
Called pt to follow up on her UTI symptoms, per after hrs line. Pt states that she had gone to an urgent care over the weekend and was given cipro prophylactic for her uti symptoms. Pt would like to request a urine culture when she comes in this week for radiation.Told pt to give the antibiotic a few days to work in her body. If symptoms worsens, we can send for urine culture. Pt verbalized understanding. Advised pt to increase hydration and to take a probiotic and cranberry to help aid in healthy urinary tract. Pt will call to update with her symptoms on Wednesday before coming in for radiation treatment.

## 2016-11-22 NOTE — Progress Notes (Signed)
[facility] Counseling Note  Called pt to f/u on her health after cancelling appointment on 7/17. Pt stated that she is recovering from a UTI and preparing for tomorrow's (7/18) radiation tx of brain. Pt is nervous, but ready to get the appointment over with. Pt was not able to reschedule individual counseling session due to not knowing next week's schedule. Counselor agreed to call pt on Thursday (7/19) to check in following the radiation tx and see if she is able to have last session next week.  Westly Pam, [place] [facility] [place]

## 2016-11-23 ENCOUNTER — Ambulatory Visit: Payer: Medicare Other

## 2016-11-23 ENCOUNTER — Ambulatory Visit
Admission: RE | Admit: 2016-11-23 | Discharge: 2016-11-23 | Disposition: A | Discharge status: Home / Self Care | Payer: Medicare Other | Source: Ambulatory Visit | Attending: Radiation Oncology | Admitting: Radiation Oncology

## 2016-11-23 VITALS — BP 154/70 | HR 72 | Temp 98.1°F | Resp 16

## 2016-11-23 DIAGNOSIS — C782 Secondary malignant neoplasm of pleura: Secondary | ICD-10-CM | POA: Diagnosis not present

## 2016-11-23 DIAGNOSIS — C7931 Secondary malignant neoplasm of brain: Secondary | ICD-10-CM

## 2016-11-23 DIAGNOSIS — Z510 Encounter for antineoplastic radiation therapy: Secondary | ICD-10-CM | POA: Diagnosis not present

## 2016-11-23 DIAGNOSIS — C50411 Malignant neoplasm of upper-outer quadrant of right female breast: Secondary | ICD-10-CM | POA: Diagnosis not present

## 2016-11-23 DIAGNOSIS — C7951 Secondary malignant neoplasm of bone: Secondary | ICD-10-CM | POA: Diagnosis not present

## 2016-11-23 DIAGNOSIS — Z170 Estrogen receptor positive status [ER+]: Secondary | ICD-10-CM | POA: Diagnosis not present

## 2016-11-23 NOTE — Progress Notes (Signed)
Received patient in the clinic following [place] treatment. Patient accompanied by her husband. Patient alert and oriented x 3. No distress noted. Steady gait noted. Denies headache, dizziness, nausea, diplopia or ringing in the ears. Denies taking decadron at this time. Instructed to avoid strenuous activity for the next 24 hours and call [phone] with needs. Patient verbalized understanding. One month follow up appointment card given to patient by Mont Dutton.   BP (!) 154/70   Pulse 72   Temp 98.1 F (36.7 C) (Oral)   Resp 16   SpO2 98%  Wt Readings from Last 3 Encounters:  11/10/16 134 lb 4.8 oz (60.9 kg)  11/02/16 135 lb 8 oz (61.5 kg)  10/26/16 135 lb 12.8 oz (61.6 kg)

## 2016-11-23 NOTE — Op Note (Signed)
Name: SPIRIT WERNLI    MRN: [id number]   Date: 11/23/2016    DOB: 11-Nov-1945   STEREOTACTIC RADIOSURGERY OPERATIVE NOTE  PRE-OPERATIVE DIAGNOSIS:  Metastatic breast CA  POST-OPERATIVE DIAGNOSIS:  Same  PROCEDURE:  Stereotactic Radiosurgery  SURGEON:  Consuella Lose, MD  RADIATION ONCOLOGIST: Dr. Tyler Pita, MD  TECHNIQUE:  The patient underwent a radiation treatment planning session in the radiation oncology simulation suite under the care of the radiation oncology physician and physicist.  I participated closely in the radiation treatment planning afterwards. The patient underwent planning CT which was fused to 3T high resolution MRI with 1 mm axial slices.  These images were fused on the planning system.  We contoured the gross target volumes and subsequently expanded this to yield the Planning Target Volume. I actively participated in the planning process.  I helped to define and review the target contours and also the contours of the optic pathway, eyes, brainstem and selected nearby organs at risk.  All the dose constraints for critical structures were reviewed and compared to AAPM Task Group 101.  The prescription dose conformity was reviewed.  I approved the plan electronically.    Accordingly, [name]  was brought to the TrueBeam stereotactic radiation treatment linac and placed in the custom immobilization mask.  The patient was aligned according to the IR fiducial markers with BrainLab Exactrac, then orthogonal x-rays were used in ExacTrac with the 6DOF robotic table and the shifts were made to align the patient  [name] received stereotactic radiosurgery to a prescription dose of 20Gy to the right cerebellar lesion uneventfully.    The detailed description of the procedure is recorded in the radiation oncology procedure note.  I was present for the duration of the procedure.  DISPOSITION:   Following delivery, the patient was transported to nursing in stable  condition and monitored for possible acute effects to be discharged to home in stable condition with follow-up in one month.  Consuella Lose, MD [facility]

## 2016-11-23 NOTE — Progress Notes (Signed)
  Radiation Oncology         [phone] ________________________________  Stereotactic Treatment Procedure Note  Name: Heather [name] MRN: [id number]  Date: 11/23/2016  DOB: 1945-12-12  SPECIAL TREATMENT PROCEDURE  No diagnosis found.  3D TREATMENT PLANNING AND DOSIMETRY:  The patient's radiation plan was reviewed and approved by neurosurgery and radiation oncology prior to treatment.  It showed 3-dimensional radiation distributions overlaid onto the planning CT/MRI image set.  The [facility] for the target structures as well as the organs at risk were reviewed. The documentation of the 3D plan and dosimetry are filed in the radiation oncology EMR.  NARRATIVE:  Heather [name] was brought to the TrueBeam stereotactic radiation treatment machine and placed supine on the CT couch. The head frame was applied, and the patient was set up for stereotactic radiosurgery.  Neurosurgery was present for the set-up and delivery  SIMULATION VERIFICATION:  In the couch zero-angle position, the patient underwent Exactrac imaging using the Brainlab system with orthogonal KV images.  These were carefully aligned and repeated to confirm treatment position for each of the isocenters.  The Exactrac snap film verification was repeated at each couch angle.  PROCEDURE: Heather [name] received stereotactic radiosurgery to the following targets: Right cerebellar 6 mm target was treated using 3 Dynamic Conformal Arcs to a prescription dose of 20 Gy.  ExacTrac registration was performed for each couch angle.  The 81.6% isodose line was prescribed.  6 MV X-rays were delivered in the flattening filter free beam mode.  STEREOTACTIC TREATMENT MANAGEMENT:  Following delivery, the patient was transported to nursing in stable condition and monitored for possible acute effects.  Vital signs were recorded BP (!) 154/70   Pulse 72   Temp 98.1 F (36.7 C) (Oral)   Resp 16   SpO2 98% . The patient tolerated treatment without significant  acute effects, and was discharged to home in stable condition.    PLAN: Follow-up in one month.  ________________________________  Sheral Apley. [name], M.D.   This document serves as a record of services personally performed by Tyler Pita, MD. It was created on his behalf by Arlyce Harman, a trained medical scribe. The creation of this record is based on the scribe's personal observations and the provider's statements to them. This document has been checked and approved by the attending provider.

## 2016-11-24 ENCOUNTER — Ambulatory Visit: Admission: RE | Admit: 2016-11-24 | Payer: Medicare Other | Source: Ambulatory Visit | Admitting: Radiation Oncology

## 2016-11-25 ENCOUNTER — Encounter: Payer: Self-pay | Admitting: Radiation Oncology

## 2016-11-25 NOTE — Progress Notes (Signed)
  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  The rib was treated to 30 Gy in 10 fractions 2.  Right cerebellar 6 mm target was treated using 3 Dynamic Conformal Arcs to a prescription dose of 20 Gy in one fraction  Beams/energy:   1.  A 3-field 3D plan using 6 MV X-rays was used. 2.  ExacTrac registration was performed for each couch angle.  The 81.6% isodose line was prescribed.  6 MV X-rays were delivered in the flattening filter free beam mode   Narrative: The patient tolerated radiation treatment relatively well.   No complications were noted.  Plan: The patient has completed radiation treatment. The patient will return to radiation oncology clinic for routine followup in one month. I advised her to call or return sooner if she has any questions or concerns related to her recovery or treatment.  ________________________________  Sheral Apley. [name], M.D.   This document serves as a record of services personally performed by Tyler Pita, MD. It was created on his behalf by Valeta Harms, a trained medical scribe. The creation of this record is based on the scribe's personal observations and the provider's statements to them. This document has been checked and approved by the attending provider.

## 2016-11-25 NOTE — Progress Notes (Signed)
[facility] Counseling Note  Counselor called pt to f/u on pt's emotional process following her radiation procedure on 7/18. Pt was quite anxious about the tx, but seemed to be doing well during the phone call. Pt reported that her only side effects so far were headaches, which she was pleased with. Pt and counselor were unable to schedule final individual session as pt plans to go out of town next week; counselor engaged pt in termination activities, including reflection on work together, sadness at this goodbye, and planning for potential future counseling sessions. Pt reported that she feels that she is in a much better place emotionally than when she started counseling.   Heather [name], [facility] [facility] [place]

## 2016-12-08 ENCOUNTER — Ambulatory Visit (HOSPITAL_BASED_OUTPATIENT_CLINIC_OR_DEPARTMENT_OTHER): Payer: Medicare Other

## 2016-12-08 ENCOUNTER — Ambulatory Visit: Payer: Medicare Other

## 2016-12-08 ENCOUNTER — Other Ambulatory Visit (HOSPITAL_BASED_OUTPATIENT_CLINIC_OR_DEPARTMENT_OTHER): Payer: Medicare Other

## 2016-12-08 ENCOUNTER — Ambulatory Visit: Payer: Medicare Other | Admitting: Hematology and Oncology

## 2016-12-08 VITALS — BP 150/77 | HR 72 | Temp 97.6°F | Resp 17

## 2016-12-08 DIAGNOSIS — C50911 Malignant neoplasm of unspecified site of right female breast: Secondary | ICD-10-CM

## 2016-12-08 DIAGNOSIS — C7951 Secondary malignant neoplasm of bone: Secondary | ICD-10-CM | POA: Diagnosis not present

## 2016-12-08 DIAGNOSIS — C50411 Malignant neoplasm of upper-outer quadrant of right female breast: Secondary | ICD-10-CM

## 2016-12-08 DIAGNOSIS — Z5111 Encounter for antineoplastic chemotherapy: Secondary | ICD-10-CM | POA: Diagnosis not present

## 2016-12-08 DIAGNOSIS — Z5112 Encounter for antineoplastic immunotherapy: Secondary | ICD-10-CM

## 2016-12-08 LAB — CBC WITH DIFFERENTIAL/PLATELET
BASO%: 0.5 % (ref 0.0–2.0)
Basophils Absolute: 0 10*3/uL (ref 0.0–0.1)
EOS%: 2.9 % (ref 0.0–7.0)
Eosinophils Absolute: 0.1 10*3/uL (ref 0.0–0.5)
HCT: 38 % (ref 34.8–46.6)
HGB: 12.3 g/dL (ref 11.6–15.9)
LYMPH%: 11.5 % — ABNORMAL LOW (ref 14.0–49.7)
MCH: 26.9 pg (ref 25.1–34.0)
MCHC: 32.4 g/dL (ref 31.5–36.0)
MCV: 83 fL (ref 79.5–101.0)
MONO#: 0.4 10*3/uL (ref 0.1–0.9)
MONO%: 10 % (ref 0.0–14.0)
NEUT#: 3.1 10*3/uL (ref 1.5–6.5)
NEUT%: 75.1 % (ref 38.4–76.8)
Platelets: 173 10*3/uL (ref 145–400)
RBC: 4.58 10*6/uL (ref 3.70–5.45)
RDW: 15.7 % — ABNORMAL HIGH (ref 11.2–14.5)
WBC: 4.1 10*3/uL (ref 3.9–10.3)
lymph#: 0.5 10*3/uL — ABNORMAL LOW (ref 0.9–3.3)

## 2016-12-08 LAB — COMPREHENSIVE METABOLIC PANEL
ALT: 19 U/L (ref 0–55)
AST: 27 U/L (ref 5–34)
Albumin: 3.9 g/dL (ref 3.5–5.0)
Alkaline Phosphatase: 76 U/L (ref 40–150)
Anion Gap: 9 mEq/L (ref 3–11)
BUN: 11.9 mg/dL (ref 7.0–26.0)
CO2: 27 mEq/L (ref 22–29)
Calcium: 9.9 mg/dL (ref 8.4–10.4)
Chloride: 103 mEq/L (ref 98–109)
Creatinine: 0.8 mg/dL (ref 0.6–1.1)
EGFR: 76 mL/min/{1.73_m2} — ABNORMAL LOW (ref >90)
Glucose: 94 mg/dl (ref 70–140)
Potassium: 4 mEq/L (ref 3.5–5.1)
Sodium: 139 mEq/L (ref 136–145)
Total Bilirubin: 0.59 mg/dL (ref 0.20–1.20)
Total Protein: 7.1 g/dL (ref 6.4–8.3)

## 2016-12-08 MED ORDER — SODIUM CHLORIDE 0.9 % IV SOLN
420.0000 mg | Freq: Once | INTRAVENOUS | Status: AC
  Administered 2016-12-08: 420 mg via INTRAVENOUS
  Filled 2016-12-08: qty 14

## 2016-12-08 MED ORDER — DIPHENHYDRAMINE HCL 25 MG PO CAPS
ORAL_CAPSULE | ORAL | Status: AC
  Filled 2016-12-08: qty 1

## 2016-12-08 MED ORDER — SODIUM CHLORIDE 0.9 % IJ SOLN
10.0000 mL | INTRAMUSCULAR | Status: DC | PRN
  Administered 2016-12-08: 10 mL via INTRAVENOUS
  Filled 2016-12-08: qty 10

## 2016-12-08 MED ORDER — SODIUM CHLORIDE 0.9% FLUSH
10.0000 mL | INTRAVENOUS | Status: DC | PRN
  Administered 2016-12-08: 10 mL
  Filled 2016-12-08: qty 10

## 2016-12-08 MED ORDER — ACETAMINOPHEN 325 MG PO TABS
ORAL_TABLET | ORAL | Status: AC
Start: 2016-12-08 — End: 2016-12-08
  Filled 2016-12-08: qty 2

## 2016-12-08 MED ORDER — ACETAMINOPHEN 325 MG PO TABS
650.0000 mg | ORAL_TABLET | Freq: Once | ORAL | Status: AC
  Administered 2016-12-08: 650 mg via ORAL

## 2016-12-08 MED ORDER — TRASTUZUMAB CHEMO 150 MG IV SOLR
6.0000 mg/kg | Freq: Once | INTRAVENOUS | Status: AC
  Administered 2016-12-08: 378 mg via INTRAVENOUS
  Filled 2016-12-08: qty 18

## 2016-12-08 MED ORDER — FULVESTRANT 250 MG/5ML IM SOLN
500.0000 mg | Freq: Once | INTRAMUSCULAR | Status: AC
  Administered 2016-12-08: 500 mg via INTRAMUSCULAR
  Filled 2016-12-08: qty 10

## 2016-12-08 MED ORDER — HEPARIN SOD (PORK) LOCK FLUSH 100 UNIT/ML IV SOLN
500.0000 [IU] | Freq: Once | INTRAVENOUS | Status: AC | PRN
  Administered 2016-12-08: 500 [IU]
  Filled 2016-12-08: qty 5

## 2016-12-08 MED ORDER — DIPHENHYDRAMINE HCL 25 MG PO CAPS
50.0000 mg | ORAL_CAPSULE | Freq: Once | ORAL | Status: AC
  Administered 2016-12-08: 25 mg via ORAL

## 2016-12-08 MED ORDER — SODIUM CHLORIDE 0.9 % IV SOLN
Freq: Once | INTRAVENOUS | Status: AC
  Administered 2016-12-08: 11:00:00 via INTRAVENOUS

## 2016-12-08 NOTE — Patient Instructions (Signed)
[facility] Discharge Instructions for Patients Receiving Chemotherapy  Today you received the following chemotherapy agents: Herceptin and Perjeta   To help prevent nausea and vomiting after your treatment, we encourage you to take your nausea medication as directed.    If you develop nausea and vomiting that is not controlled by your nausea medication, call the clinic.   BELOW ARE SYMPTOMS THAT SHOULD BE REPORTED IMMEDIATELY:  *FEVER GREATER THAN 100.5 F  *CHILLS WITH OR WITHOUT FEVER  NAUSEA AND VOMITING THAT IS NOT CONTROLLED WITH YOUR NAUSEA MEDICATION  *UNUSUAL SHORTNESS OF BREATH  *UNUSUAL BRUISING OR BLEEDING  TENDERNESS IN MOUTH AND THROAT WITH OR WITHOUT PRESENCE OF ULCERS  *URINARY PROBLEMS  *BOWEL PROBLEMS  UNUSUAL RASH Items with * indicate a potential emergency and should be followed up as soon as possible.  Feel free to call the clinic you have any questions or concerns. The clinic phone number is [phone].  Please show the [facility] at check-in to the Emergency Department and triage nurse.

## 2016-12-12 DIAGNOSIS — I10 Essential (primary) hypertension: Secondary | ICD-10-CM | POA: Diagnosis not present

## 2016-12-12 DIAGNOSIS — C7931 Secondary malignant neoplasm of brain: Secondary | ICD-10-CM | POA: Diagnosis not present

## 2016-12-12 DIAGNOSIS — M5414 Radiculopathy, thoracic region: Secondary | ICD-10-CM | POA: Diagnosis not present

## 2016-12-12 DIAGNOSIS — T84498D Other mechanical complication of other internal orthopedic devices, implants and grafts, subsequent encounter: Secondary | ICD-10-CM | POA: Diagnosis not present

## 2016-12-22 ENCOUNTER — Ambulatory Visit: Payer: Medicare Other | Attending: Neurosurgery | Admitting: Physical Therapy

## 2016-12-22 VITALS — HR 85

## 2016-12-22 DIAGNOSIS — R293 Abnormal posture: Secondary | ICD-10-CM | POA: Diagnosis not present

## 2016-12-22 DIAGNOSIS — M6281 Muscle weakness (generalized): Secondary | ICD-10-CM | POA: Diagnosis not present

## 2016-12-22 DIAGNOSIS — R269 Unspecified abnormalities of gait and mobility: Secondary | ICD-10-CM | POA: Insufficient documentation

## 2016-12-22 DIAGNOSIS — R2681 Unsteadiness on feet: Secondary | ICD-10-CM | POA: Insufficient documentation

## 2016-12-22 DIAGNOSIS — M546 Pain in thoracic spine: Secondary | ICD-10-CM | POA: Insufficient documentation

## 2016-12-22 DIAGNOSIS — R29810 Facial weakness: Secondary | ICD-10-CM | POA: Insufficient documentation

## 2016-12-22 NOTE — Therapy (Signed)
[facility] [place], [place], [address] Phone: [phone]   Fax:  [phone]  Physical Therapy Evaluation  Patient Details  Name: Heather [name] MRN: [id number] Date of Birth: 07/12/1945 Referring Provider: Consuella Lose MD  Encounter Date: 12/22/2016      PT End of Session - 12/22/16 1745    Visit Number 1   Number of Visits 16   Date for PT Re-Evaluation 02/20/17   PT Start Time 1122   PT Stop Time 1206   PT Time Calculation (min) 44 min   Activity Tolerance Patient tolerated treatment well   Behavior During Therapy [facility] for tasks assessed/performed      Past Medical History:  Diagnosis Date  . Anemia    hx of  . Anxiety    r/t updated surgery  . Breast cancer ([place])    right  . Cataract    immature-not sure of which eye  . Complication of anesthesia    pt states she is sensitive to meds  . Dizziness    has been going on for 66month;medical MD aware  . Genetic testing 07/11/2016   Test Results: Normal; No pathogenic mutations detected  Genes Analyzed: 43 genes on [employer]'s Common Cancers panel (APC, ATM, AXIN2, BARD1, BMPR1A, BRCA1, BRCA2, BRIP1, CDH1, CDKN2A, CHEK2, DICER1, EPCAM, GREM1, HOXB13, KIT, MEN1, MLH1, MSH2, MSH6, MUTYH, NBN, NF1, PALB2, PDGFRA, PMS2, POLD1, POLE, PTEN, RAD50, RAD51C, RAD51D, SDHA, SDHB, SDHC, SDHD, SMAD4, SMARCA4, STK11, TP53, TSC1, TSC2, VHL).  .Marland KitchenGERD (gastroesophageal reflux disease)    Tums prn  . H. pylori infection   . H/O hiatal hernia   . Hemorrhoids   . History of UTI   . Hx of radiation therapy 10/25/12- 12/11/12   right chest wall/regional lymph nodes 5040 cGy, 28 sessions, right chest wall boost 1000 cGy 1 session  . Hx: UTI (urinary tract infection)   . Hyperlipidemia    but not on meds;diet and exercise controlled  . Hypertension    recently started Aldactone   . Insomnia    takes Melatoniin daily  . Metastasis to spinal column (HCC)    T5, Left  Femur cancer-  radiation   . Neuropathy    "from back surgery"  . PONV (postoperative nausea and vomiting)    pt experienced hair loss, confusion and combative- 02/15/15  . Right knee pain   . Right shoulder pain   . Skin cancer    squamous, Nodule to back - "same cancer as breast."  . Urinary frequency    d/t taking Aldactone    Past Surgical History:  Procedure Laterality Date  . COLONOSCOPY    . cyst removed from left breast  1970  . DECOMPRESSIVE LUMBAR LAMINECTOMY LEVEL 4 N/A 02/15/2015   Procedure: DECOMPRESSION T5 AND T3-T7 STABALIZATION;  Surgeon: NConsuella Lose MD;  [facility];  Service: Neurosurgery;  Laterality: N/A;  . ESOPHAGOGASTRODUODENOSCOPY    . LAMINECTOMY N/A 03/27/2015   Procedure: Thoracic Four-Thoracic Six Laminectomy for tumor;  Surgeon: NConsuella Lose MD;  [facility];  Service: Neurosurgery;  Laterality: N/A;  T4-T6 Laminectomy  . left wrist surgery   2004   with plate  . MASTECTOMY MODIFIED RADICAL  05/10/2012   Procedure: MASTECTOMY MODIFIED RADICAL;  Surgeon: PMerrie Roof MD;  [facility]  Service: General;  Laterality: Right;  RIGHT MODIFIED RADICAL MASTECTOMY  . MASTECTOMY, RADICAL Right   . MINOR BREAST BIOPSY Left 05/16/2016   Procedure: EXCISION  OF BACK NODULE;  Surgeon: Autumn Messing III, MD;  Location: [facility];  Service: General;  Laterality: Left;  . Nodule  back  05/17/2015  . PORTACATH PLACEMENT  05/10/2012   Procedure: INSERTION PORT-A-CATH;  Surgeon: Merrie Roof, MD;  Location: [facility];  Service: General;  Laterality: Left;    Vitals:   12/22/16 1754  Pulse: 85  SpO2: 97%         Subjective Assessment - 12/22/16 1754    Subjective The patient presents to OPPT with c/o thoracic pain and a feeling of weakness and instability when walking.  She reports her problems began in 02/2015 when she was diagnosed with cancer.  She reports this has resulted in three spinal surgeries.  She states she takes monthly  chemotherapy as her cancer is still active.  She reports she had come a long way as before she could nt even get out of bed on her on.  She is now able to walk with a straight cane independently.  She hopes to get stronger from physical therapy but does not expect her pain to decrease a great deal.  her pain today is a 4/10 and reported as "sore."  Increased activity increases her pain and meds help decrease her pain somewhat.   Pertinent History Cancer (still active).  Neuropathy.     Patient Stated Goals Get stronger.   Currently in Pain? Yes   Pain Score 4    Pain Location Back   Pain Orientation Right;Left   Pain Descriptors / Indicators Sore   Pain Type Chronic pain   Pain Onset More than a month ago   Pain Frequency Constant   Aggravating Factors  See above.   Pain Relieving Factors See above.            [facility] PT Assessment - 12/22/16 0001      Assessment   Medical Diagnosis Thoracic radiculopathy.   Referring Provider Consuella Lose MD   Onset Date/Surgical Date --  02/2015.     Precautions   Precautions Fall     Restrictions   Weight Bearing Restrictions No     Balance Screen   Has the patient fallen in the past 6 months No   Has the patient had a decrease in activity level because of a fear of falling?  No   Is the patient reluctant to leave their home because of a fear of falling?  No     Home Ecologist residence     Prior Function   Level of Independence Independent     Cognition   Overall Cognitive Status Within Functional Limits for tasks assessed     Sensation   Additional Comments Neuropathy in LE's.     Coordination   Gross Motor Movements are Fluid and Coordinated --  Impaired LE proprioception.     Posture/Postural Control   Posture/Postural Control Postural limitations   Postural Limitations Rounded Shoulders;Forward head;Decreased lumbar lordosis     ROM / Strength   AROM / PROM / Strength AROM;Strength      AROM   Overall AROM Comments Full bilateral UE AROM.     Strength   Overall Strength Comments Bilateral middle trap and Rhomboid musculature graded at 4/5.     Palpation   Palpation comment Diffuse c/o spinal pain over entire thoracic region and bilateral UT's.     Special Tests    Special Tests --  (+) Romberg test.  Transfers   Five time sit to stand comments  --  Supervision only.     Ambulation/Gait   Gait Comments Mild gait ataxia noted today.  Patient uses a straight cane fro safety.     Standardized Balance Assessment   Standardized Balance Assessment Berg Balance Test     Berg Balance Test   Sit to Stand Able to stand without using hands and stabilize independently   Standing Unsupported Able to stand 2 minutes with supervision   Sitting with Back Unsupported but Feet Supported on Floor or Stool Able to sit safely and securely 2 minutes   Stand to Sit Sits safely with minimal use of hands   Transfers Able to transfer safely, minor use of hands   Standing Unsupported with Eyes Closed Needs help to keep from falling   Standing Ubsupported with Feet Together Needs help to attain position and unable to hold for 15 seconds   From Standing, Reach Forward with Outstretched Arm Can reach confidently >25 cm (10")   From Standing Position, Pick up Object from Floor Able to pick up shoe, needs supervision   From Standing Position, Turn to Look Behind Over each Shoulder Looks behind from both sides and weight shifts well   Turn 360 Degrees Able to turn 360 degrees safely one side only in 4 seconds or less   Standing Unsupported, Alternately Place Feet on Step/Stool Able to complete 4 steps without aid or supervision   Standing Unsupported, One Foot in Front Able to take small step independently and hold 30 seconds   Standing on One Leg Able to lift leg independently and hold equal to or more than 3 seconds   Total Score 39            Objective measurements  completed on examination: See above findings.          [facility] Adult PT Treatment/Exercise - 12/22/16 0001      Exercises   Exercises Knee/Hip     Knee/Hip Exercises: Aerobic   Nustep Level 2 x 10 minutes with a cadence at 70 steps/min.                  PT Short Term Goals - 12/22/16 1808      PT SHORT TERM GOAL #1   Title STG's=LTG's.           PT Long Term Goals - 12/22/16 1810      PT LONG TERM GOAL #1   Title Independent with a HEP.   Time 8   Period Weeks   Status New     PT LONG TERM GOAL #2   Title Increase thoracic strength to a solid 5/5.   Time 8   Period Weeks   Status New     PT LONG TERM GOAL #3   Title Negative Romberg test.   Time 8   Period Weeks   Status New     PT LONG TERM GOAL #4   Title Improve Berg score to 49-50/56.   Time 8   Period Weeks   Status New                Plan - 12/22/16 1802    Clinical Impression Statement The patient presents to OPPT with c/o thoracic pain which is diffusely tender to palpation all along this region of her spine.  She has a balance deficit with a Berg score of 39/56 and a positive Romberg test.  She is weak globally throughout  her thoracic musculature and her LE proprioception is impaired. Her cancer remains active at this time and she is receiving monthly chemotheapy treatments currently.  Deficits impair her functional mobility.  Patient will benefit from skilled physical to address aforementioned deficits.    History and Personal Factors relevant to plan of care: Cancer and spinal surgery.   Clinical Presentation Evolving   Clinical Decision Making Moderate   Rehab Potential Good   PT Frequency 2x / week   PT Duration 8 weeks   PT Treatment/Interventions ADLs/Self Care Home Management;Gait training;Functional mobility training;Therapeutic activities;Therapeutic exercise;Balance training;Neuromuscular re-education   PT Next Visit Plan General thoracic and core strengthening.   Postural stretches.  Gait and balance training.  Neuro-re-edu and proprioceptive training.   Consulted and Agree with Plan of Care Patient      Patient will benefit from skilled therapeutic intervention in order to improve the following deficits and impairments:  Abnormal gait, Decreased activity tolerance, Decreased mobility, Decreased strength, Impaired sensation, Pain, Postural dysfunction  Visit Diagnosis: Pain in thoracic spine - Plan: PT plan of care cert/re-cert  Facial weakness - Plan: PT plan of care cert/re-cert  Abnormal posture - Plan: PT plan of care cert/re-cert      G-Codes - 60/15/61 1746    Functional Assessment Tool Used (Outpatient Only) Clinical judgement...   Functional Limitation Mobility: Walking and moving around   Mobility: Walking and Moving Around Current Status [phone]) At least 40 percent but less than 60 percent impaired, limited or restricted   Mobility: Walking and Moving Around Goal Status [phone]) At least 20 percent but less than 40 percent impaired, limited or restricted       Problem List Patient Active Problem List   Diagnosis Date Noted  . Brain metastasis ([place]) 11/18/2016  . Genetic testing 07/11/2016  . ALLERGY TO CHLORHEXADINE, USE BETADINE ONLY 06/23/2016  . Kyphosis of thoracic region 06/13/2016  . Plantar fasciitis, bilateral 04/20/2016  . Neuropathic pain 01/27/2016  . Anemia of chronic disease 05/21/2015  . Metastatic cancer to spine ([place]) 03/27/2015  . Thrombocytopenia ([place]) 03/12/2015  . Prerenal azotemia 03/12/2015  . Pain   . Recurrent UTI--due to Klebsiella   . Paraplegia at T4 level ([place]) 02/20/2015  . Neurogenic bowel 02/20/2015  . Neurogenic bladder 02/20/2015  . Postoperative anemia due to acute blood loss 02/17/2015  . Spinal cord compression due to malignant neoplasm metastatic to spine ([place]) 02/15/2015  . Epidural mass 02/15/2015  . Thoracic spine tumor   . Pathologic fracture of vertebra 02/14/2015  . Pathologic  fracture of thoracic vertebrae 02/14/2015  . Breast cancer metastasized to bone, right ([place]) 02/14/2015  . Hyperlipidemia 02/14/2015  . H. pylori infection   . HTN (hypertension) 04/17/2012  . Primary cancer of upper outer quadrant of right female breast ([place]) 03/08/2012    Kaj Vasil, [place] MPT 12/22/2016, 6:13 PM  [facility] [place], [place], [address] Phone: [phone]   Fax:  [phone]  Name: Heather [name] MRN: [id number] Date of Birth: May 24, 1945

## 2016-12-26 DIAGNOSIS — C7951 Secondary malignant neoplasm of bone: Secondary | ICD-10-CM | POA: Diagnosis not present

## 2016-12-26 DIAGNOSIS — C493 Malignant neoplasm of connective and soft tissue of thorax: Secondary | ICD-10-CM | POA: Diagnosis not present

## 2016-12-26 DIAGNOSIS — Z0000 Encounter for general adult medical examination without abnormal findings: Secondary | ICD-10-CM | POA: Diagnosis not present

## 2016-12-26 DIAGNOSIS — F419 Anxiety disorder, unspecified: Secondary | ICD-10-CM | POA: Diagnosis not present

## 2016-12-26 DIAGNOSIS — I10 Essential (primary) hypertension: Secondary | ICD-10-CM | POA: Diagnosis not present

## 2016-12-26 DIAGNOSIS — E785 Hyperlipidemia, unspecified: Secondary | ICD-10-CM | POA: Diagnosis not present

## 2016-12-26 DIAGNOSIS — C50411 Malignant neoplasm of upper-outer quadrant of right female breast: Secondary | ICD-10-CM | POA: Diagnosis not present

## 2016-12-28 ENCOUNTER — Ambulatory Visit: Payer: Medicare Other | Admitting: Physical Therapy

## 2016-12-28 DIAGNOSIS — R293 Abnormal posture: Secondary | ICD-10-CM | POA: Diagnosis not present

## 2016-12-28 DIAGNOSIS — M6281 Muscle weakness (generalized): Secondary | ICD-10-CM

## 2016-12-28 DIAGNOSIS — R269 Unspecified abnormalities of gait and mobility: Secondary | ICD-10-CM

## 2016-12-28 DIAGNOSIS — R29810 Facial weakness: Secondary | ICD-10-CM | POA: Diagnosis not present

## 2016-12-28 DIAGNOSIS — M546 Pain in thoracic spine: Secondary | ICD-10-CM

## 2016-12-28 DIAGNOSIS — R2681 Unsteadiness on feet: Secondary | ICD-10-CM | POA: Diagnosis not present

## 2016-12-28 NOTE — Therapy (Signed)
[facility] , [place], [address] Phone: [phone]   Fax:  [phone]  Physical Therapy Treatment  Patient Details  Name: Heather [name] MRN: [id number] Date of Birth: 1945-11-19 Referring Provider: Consuella Lose MD  Encounter Date: 12/28/2016      PT End of Session - 12/28/16 1113    Visit Number 2   Number of Visits 16   Date for PT Re-Evaluation 02/20/17   PT Start Time 9485   PT Stop Time 4627  Limited by fatigue.   PT Time Calculation (min) 40 min   Activity Tolerance Patient tolerated treatment well;Patient limited by fatigue   Behavior During Therapy [facility] for tasks assessed/performed      Past Medical History:  Diagnosis Date  . Anemia    hx of  . Anxiety    r/t updated surgery  . Breast cancer ([place])    right  . Cataract    immature-not sure of which eye  . Complication of anesthesia    pt states she is sensitive to meds  . Dizziness    has been going on for 74month;medical MD aware  . Genetic testing 07/11/2016   Test Results: Normal; No pathogenic mutations detected  Genes Analyzed: 43 genes on [employer] Common Cancers panel (APC, ATM, AXIN2, BARD1, BMPR1A, BRCA1, BRCA2, BRIP1, CDH1, CDKN2A, CHEK2, DICER1, EPCAM, GREM1, HOXB13, KIT, MEN1, MLH1, MSH2, MSH6, MUTYH, NBN, NF1, PALB2, PDGFRA, PMS2, POLD1, POLE, PTEN, RAD50, RAD51C, RAD51D, SDHA, SDHB, SDHC, SDHD, SMAD4, SMARCA4, STK11, TP53, TSC1, TSC2, VHL).  .Marland KitchenGERD (gastroesophageal reflux disease)    Tums prn  . H. pylori infection   . H/O hiatal hernia   . Hemorrhoids   . History of UTI   . Hx of radiation therapy 10/25/12- 12/11/12   right chest wall/regional lymph nodes 5040 cGy, 28 sessions, right chest wall boost 1000 cGy 1 session  . Hx: UTI (urinary tract infection)   . Hyperlipidemia    but not on meds;diet and exercise controlled  . Hypertension    recently started Aldactone   . Insomnia    takes Melatoniin daily  . Metastasis to  spinal column (HCC)    T5, Left  Femur cancer- radiation   . Neuropathy    "from back surgery"  . PONV (postoperative nausea and vomiting)    pt experienced hair loss, confusion and combative- 02/15/15  . Right knee pain   . Right shoulder pain   . Skin cancer    squamous, Nodule to back - "same cancer as breast."  . Urinary frequency    d/t taking Aldactone    Past Surgical History:  Procedure Laterality Date  . COLONOSCOPY    . cyst removed from left breast  1970  . DECOMPRESSIVE LUMBAR LAMINECTOMY LEVEL 4 N/A 02/15/2015   Procedure: DECOMPRESSION T5 AND T3-T7 STABALIZATION;  Surgeon: NConsuella Lose MD;  [facility];  Service: Neurosurgery;  Laterality: N/A;  . ESOPHAGOGASTRODUODENOSCOPY    . LAMINECTOMY N/A 03/27/2015   Procedure: Thoracic Four-Thoracic Six Laminectomy for tumor;  Surgeon: NConsuella Lose MD;  [facility];  Service: Neurosurgery;  Laterality: N/A;  T4-T6 Laminectomy  . left wrist surgery   2004   with plate  . MASTECTOMY MODIFIED RADICAL  05/10/2012   Procedure: MASTECTOMY MODIFIED RADICAL;  Surgeon: PMerrie Roof MD;  [facility] [facility]  Service: General;  Laterality: Right;  RIGHT MODIFIED RADICAL MASTECTOMY  . MASTECTOMY, RADICAL Right   . MINOR BREAST  BIOPSY Left 05/16/2016   Procedure: EXCISION OF BACK NODULE;  Surgeon: Autumn Messing III, MD;  Location: [facility];  Service: General;  Laterality: Left;  . Nodule  back  05/17/2015  . PORTACATH PLACEMENT  05/10/2012   Procedure: INSERTION PORT-A-CATH;  Surgeon: Merrie Roof, MD;  Location: [facility];  Service: General;  Laterality: Left;    There were no vitals filed for this visit.      Subjective Assessment - 12/28/16 1105    Subjective I was a even a little sore just from the assessment.   Pain Score 4    Pain Location Back   Pain Orientation Right;Left   Pain Descriptors / Indicators Sore   Pain Type Chronic pain   Pain Onset More than a month ago                          [facility] Adult PT Treatment/Exercise - 12/28/16 0001      Exercises   Exercises Knee/Hip     Knee/Hip Exercises: Aerobic   Nustep Level 3 LE's only x 15 minutes.     Knee/Hip Exercises: Machines for Strengthening   Cybex Knee Extension 10# x 3 minutes with slow reps focused both on concentric and eccentric contraction.   Cybex Knee Flexion 20# x 3 minutes with focus on concentric and eccentric control.             Balance Exercises - 12/28/16 1124      Balance Exercises: Standing   Other Standing Exercises Rockerboard x 5 minutes f/b resisted walking 2 minutes each way (8 minutes total).             PT Short Term Goals - 12/22/16 1808      PT SHORT TERM GOAL #1   Title STG's=LTG's.           PT Long Term Goals - 12/22/16 1810      PT LONG TERM GOAL #1   Title Independent with a HEP.   Time 8   Period Weeks   Status New     PT LONG TERM GOAL #2   Title Increase thoracic strength to a solid 5/5.   Time 8   Period Weeks   Status New     PT LONG TERM GOAL #3   Title Negative Romberg test.   Time 8   Period Weeks   Status New     PT LONG TERM GOAL #4   Title Improve Berg score to 49-50/56.   Time 8   Period Weeks   Status New               Plan - 12/28/16 1114    Clinical Impression Statement Patient did very well though requires rest for fatigue.   PT Treatment/Interventions ADLs/Self Care Home Management;Gait training;Functional mobility training;Therapeutic activities;Therapeutic exercise;Balance training;Neuromuscular re-education      Patient will benefit from skilled therapeutic intervention in order to improve the following deficits and impairments:  Abnormal gait, Decreased activity tolerance, Decreased mobility, Decreased strength, Impaired sensation, Pain, Postural dysfunction  Visit Diagnosis: Pain in thoracic spine  Unsteadiness on feet  Muscle weakness (generalized)  Abnormality of  gait     Problem List Patient Active Problem List   Diagnosis Date Noted  . Brain metastasis ([place]) 11/18/2016  . Genetic testing 07/11/2016  . ALLERGY TO CHLORHEXADINE, USE BETADINE ONLY 06/23/2016  . Kyphosis of thoracic region 06/13/2016  . Plantar fasciitis,  bilateral 04/20/2016  . Neuropathic pain 01/27/2016  . Anemia of chronic disease 05/21/2015  . Metastatic cancer to spine ([place]) 03/27/2015  . Thrombocytopenia ([place]) 03/12/2015  . Prerenal azotemia 03/12/2015  . Pain   . Recurrent UTI--due to Klebsiella   . Paraplegia at T4 level ([place]) 02/20/2015  . Neurogenic bowel 02/20/2015  . Neurogenic bladder 02/20/2015  . Postoperative anemia due to acute blood loss 02/17/2015  . Spinal cord compression due to malignant neoplasm metastatic to spine ([place]) 02/15/2015  . Epidural mass 02/15/2015  . Thoracic spine tumor   . Pathologic fracture of vertebra 02/14/2015  . Pathologic fracture of thoracic vertebrae 02/14/2015  . Breast cancer metastasized to bone, right ([place]) 02/14/2015  . Hyperlipidemia 02/14/2015  . H. pylori infection   . HTN (hypertension) 04/17/2012  . Primary cancer of upper outer quadrant of right female breast ([place]) 03/08/2012    APPLEGATE, [place] MPT 12/28/2016, 11:44 AM  [facility] [place], [place], [address] Phone: [phone]   Fax:  [phone]  Name: Heather [name] MRN: [id number] Date of Birth: 1945-05-22

## 2016-12-29 NOTE — Progress Notes (Addendum)
Radiation Oncology         ([phone]) [phone] ________________________________  Name: Heather [name] MRN: [id number]  Date: 12/30/2016  DOB: March 04, 1946  Post Treatment Note  CC: [name], MD  [name], MD  Diagnosis:   71 yo woman with ER positive breast cancer with a solitary brain metastasis and painful metastasis to the posterior right 8th rib  Interval Since Last Radiation:  5 weeks  6/27-7/18/18: 1.  The rib was treated to 30 Gy in 10 fractions 2.  Right cerebellar 6 mm target was treated using 3 Dynamic Conformal Arcs to a prescription dose of 20 Gy in one fraction  04/27/16-05/12/16: Left femur/ 30 Gy in 10 fractions  04/13/2015-05/19/2015: 55 Gy in 25 fractions of 2.2 Gy to the thoracic spine  10/25/12-12/11/12: 50.4 Gy to the right chest wall and regional lymph nodes in 28 fractions with a 10 Gy boost to the right chest wall in 1 session.  Narrative:  The patient returns today for routine follow-up.  The patient tolerated radiation treatment relatively well.   No complications were noted.                            On review of systems, the patient states that she is doing well in general. She does report that she had some mild nausea over the first 1-2 weeks following her SRS treatment but that has since resolved. She denies headaches, dizziness, tinnitus, changes in visual or auditory acuity, difficulty with speech or memory loss, or difficulty with hand coordination.  She has noticed significant relief since completion of palliative radiation to the right eighth rib. She reports having occasional twinges of pain in this area that radiates around anteriorly but the pain is not significant enough to warrant taking pain medications and generally resolves spontaneously within an hour or so. She denies cough, fever, chills, nausea, vomiting, abdominal pain, chest pain or increased shortness of breath. She has some issues with imbalance which she reports was present prior to her  [name] treatment and has not worsened.  She is currently working with physical therapy to regain her strength and balance. She is pleased with her progress to date. Her systemic disease is currently managed with Herceptin, Perjeta, Faslodex and Zometa under the care and direction of [name].  She has a scheduled follow-up with [name] on 01/05/2017.  ALLERGIES:  is allergic to other; acyclovir and related; zanaflex [tizanidine hcl]; codeine; keflex [cephalexin]; naprosyn [naproxen]; oxycodone; tramadol; and tussin [guaifenesin].  Meds: Current Outpatient Prescriptions  Medication Sig Dispense Refill  . ALPRAZolam (XANAX) 0.25 MG tablet Take 1 tablet by mouth as needed.    Marland Kitchen b complex vitamins tablet Take 1 tablet by mouth daily.    . Calcium Carbonate Antacid (TUMS PO) Take 2 tablets by mouth 2 (two) times daily as needed (acid reflux).    . Cholecalciferol (VITAMIN D) 2000 UNITS tablet Take 2,000 Units by mouth daily.    . Coenzyme Q10 (COQ10) 100 MG CAPS Take 100 mg by mouth as needed. Days that she takes pravastatin MWF.    Marland Kitchen fluticasone (FLONASE) 50 MCG/ACT nasal spray Place 1 spray into both nostrils daily as needed.    . gabapentin (NEURONTIN) 600 MG tablet Take 1 tablet (600 mg total) by mouth as directed. Take 1 tablet in the AM and afternoon and 1 1/2 tabs at bedtime 315 tablet 2  . HYDROcodone-acetaminophen (NORCO/VICODIN) 5-325 MG tablet     .  lidocaine-prilocaine (EMLA) cream Apply to affected area once (Patient taking differently: Apply 1 application topically every 30 (thirty) days. Apply to port prior to infusions) 30 g 3  . OVER THE COUNTER MEDICATION Place 1 drop into both eyes 2 (two) times daily as needed (dry eyes). Over the counter lubricating eye drops    . pravastatin (PRAVACHOL) 10 MG tablet Take 10 mg by mouth every Monday, Wednesday, and Friday.     . Probiotic Product (PROBIOTIC DAILY PO) Take 1 capsule by mouth daily.     . ranitidine (ZANTAC) 300 MG tablet Take 300  mg by mouth at bedtime. Reported on 06/25/2015    . spironolactone (ALDACTONE) 25 MG tablet Take 25 mg by mouth daily.     No current facility-administered medications for this encounter.     Physical Findings:  weight is 134 lb (60.8 kg). Her oral temperature is 97.8 F (36.6 C). Her blood pressure is 134/75. Her respiration is 18 and oxygen saturation is 100%.  Pain Assessment Pain Score: 2  Pain Loc: Back/10 In general this is a well appearing Caucasian female in no acute distress. She's alert and oriented x4 and appropriate throughout the examination. Cardiopulmonary assessment is negative for acute distress and she exhibits normal effort. She appears grossly neurologically intact.  PERRLA.  EOMs are intact.  Speech is clear and well organized, without evidence of difficulty with word finding.  Lab Findings: Lab Results  Component Value Date   WBC 4.1 12/08/2016   HGB 12.3 12/08/2016   HCT 38.0 12/08/2016   MCV 83.0 12/08/2016   PLT 173 12/08/2016     Radiographic Findings: No results found.  Impression/Plan: 53. 71 yo woman with ER positive breast cancer with a solitary brain metastasis and painful metastasis to the posterior right 8th rib.  She has recovered well from her recent radiotherapy and has had significant relief of pain associated with the right eighth rib metastasis.  We will repeat a brain MRI at 3 months post treatment to assess her response to radiotherapy.  This will be coordinated with an upcoming multidisciplinary brain/spine conference with plans to present and review her case and imaging prior to her follow-up office visit.  She understands the plan will be to repeat serial brain MRIs every 3 months for surveillance in an effort to detect any new lesions that may warrant further radiotherapy. 2. History of T5 metastasis with spinal cord compression.  She continues to recover from surgical decompression/stabilization (surgery x 3 with most recent procedure 06/2016)  followed by palliative radiotherapy. Most recent PET imaging revealed stability of the disease in her spine. She has not had repeat dedicated thoracic spine imaging since March 2018. We will plan to repeat MRI of the thoracic spine at the time of her upcoming brain MRI and will review this at the upcoming multidisciplinary brain and spine conference as well, unless [name] plans to repeat a PET scan in the near future.  We will coordinate imaging as appropriate based on Dr. Geralyn Flash plan at the time of her upcoming visit on 01/05/2017. She is in agreement with his comfortable with this plan. She knows to call with any questions or concerns in the interim.    Nicholos Johns, PA-C

## 2016-12-30 ENCOUNTER — Encounter: Payer: Self-pay | Admitting: Urology

## 2016-12-30 ENCOUNTER — Ambulatory Visit
Admission: RE | Admit: 2016-12-30 | Discharge: 2016-12-30 | Disposition: A | Discharge status: Home / Self Care | Payer: Medicare Other | Source: Ambulatory Visit | Attending: Urology | Admitting: Urology

## 2016-12-30 VITALS — BP 134/75 | Temp 97.8°F | Resp 18 | Wt 134.0 lb

## 2016-12-30 DIAGNOSIS — Z885 Allergy status to narcotic agent status: Secondary | ICD-10-CM | POA: Diagnosis not present

## 2016-12-30 DIAGNOSIS — C50919 Malignant neoplasm of unspecified site of unspecified female breast: Secondary | ICD-10-CM | POA: Diagnosis not present

## 2016-12-30 DIAGNOSIS — Z79899 Other long term (current) drug therapy: Secondary | ICD-10-CM | POA: Diagnosis not present

## 2016-12-30 DIAGNOSIS — G9520 Unspecified cord compression: Secondary | ICD-10-CM | POA: Insufficient documentation

## 2016-12-30 DIAGNOSIS — C7951 Secondary malignant neoplasm of bone: Secondary | ICD-10-CM | POA: Diagnosis not present

## 2016-12-30 DIAGNOSIS — Z170 Estrogen receptor positive status [ER+]: Secondary | ICD-10-CM | POA: Insufficient documentation

## 2016-12-30 DIAGNOSIS — C7931 Secondary malignant neoplasm of brain: Secondary | ICD-10-CM | POA: Diagnosis not present

## 2016-12-30 DIAGNOSIS — C50911 Malignant neoplasm of unspecified site of right female breast: Secondary | ICD-10-CM

## 2016-12-30 NOTE — Addendum Note (Signed)
Encounter addended by: Freeman Caldron, PA-C on: 12/30/2016  3:43 PM<BR>    Actions taken: Sign clinical note

## 2017-01-03 ENCOUNTER — Encounter: Payer: Medicare Other | Admitting: Physical Therapy

## 2017-01-05 ENCOUNTER — Ambulatory Visit: Payer: Medicare Other

## 2017-01-05 ENCOUNTER — Other Ambulatory Visit (HOSPITAL_BASED_OUTPATIENT_CLINIC_OR_DEPARTMENT_OTHER): Payer: Medicare Other

## 2017-01-05 ENCOUNTER — Other Ambulatory Visit: Payer: Self-pay | Admitting: Adult Health

## 2017-01-05 ENCOUNTER — Ambulatory Visit (HOSPITAL_BASED_OUTPATIENT_CLINIC_OR_DEPARTMENT_OTHER): Payer: Medicare Other

## 2017-01-05 ENCOUNTER — Ambulatory Visit (HOSPITAL_BASED_OUTPATIENT_CLINIC_OR_DEPARTMENT_OTHER): Payer: Medicare Other | Admitting: Adult Health

## 2017-01-05 DIAGNOSIS — Z5111 Encounter for antineoplastic chemotherapy: Secondary | ICD-10-CM | POA: Diagnosis not present

## 2017-01-05 DIAGNOSIS — C7951 Secondary malignant neoplasm of bone: Secondary | ICD-10-CM

## 2017-01-05 DIAGNOSIS — Z170 Estrogen receptor positive status [ER+]: Secondary | ICD-10-CM

## 2017-01-05 DIAGNOSIS — Z5112 Encounter for antineoplastic immunotherapy: Secondary | ICD-10-CM | POA: Diagnosis not present

## 2017-01-05 DIAGNOSIS — C50911 Malignant neoplasm of unspecified site of right female breast: Secondary | ICD-10-CM

## 2017-01-05 DIAGNOSIS — C50411 Malignant neoplasm of upper-outer quadrant of right female breast: Secondary | ICD-10-CM

## 2017-01-05 DIAGNOSIS — E7800 Pure hypercholesterolemia, unspecified: Secondary | ICD-10-CM | POA: Diagnosis not present

## 2017-01-05 LAB — COMPREHENSIVE METABOLIC PANEL
ALT: 20 U/L (ref 0–55)
AST: 30 U/L (ref 5–34)
Albumin: 4 g/dL (ref 3.5–5.0)
Alkaline Phosphatase: 80 U/L (ref 40–150)
Anion Gap: 8 mEq/L (ref 3–11)
BUN: 13.4 mg/dL (ref 7.0–26.0)
CO2: 27 mEq/L (ref 22–29)
Calcium: 10 mg/dL (ref 8.4–10.4)
Chloride: 103 mEq/L (ref 98–109)
Creatinine: 0.8 mg/dL (ref 0.6–1.1)
EGFR: 71 mL/min/{1.73_m2} — ABNORMAL LOW (ref >90)
Glucose: 101 mg/dl (ref 70–140)
Potassium: 4 mEq/L (ref 3.5–5.1)
Sodium: 138 mEq/L (ref 136–145)
Total Bilirubin: 0.66 mg/dL (ref 0.20–1.20)
Total Protein: 7.6 g/dL (ref 6.4–8.3)

## 2017-01-05 LAB — CBC WITH DIFFERENTIAL/PLATELET
BASO%: 0.9 % (ref 0.0–2.0)
Basophils Absolute: 0 10*3/uL (ref 0.0–0.1)
EOS%: 1.2 % (ref 0.0–7.0)
Eosinophils Absolute: 0.1 10*3/uL (ref 0.0–0.5)
HCT: 38.5 % (ref 34.8–46.6)
HGB: 12.8 g/dL (ref 11.6–15.9)
LYMPH%: 12.1 % — ABNORMAL LOW (ref 14.0–49.7)
MCH: 27.4 pg (ref 25.1–34.0)
MCHC: 33.2 g/dL (ref 31.5–36.0)
MCV: 82.5 fL (ref 79.5–101.0)
MONO#: 0.3 10*3/uL (ref 0.1–0.9)
MONO%: 7.1 % (ref 0.0–14.0)
NEUT#: 3.3 10*3/uL (ref 1.5–6.5)
NEUT%: 78.7 % — ABNORMAL HIGH (ref 38.4–76.8)
Platelets: 191 10*3/uL (ref 145–400)
RBC: 4.67 10*6/uL (ref 3.70–5.45)
RDW: 15.7 % — ABNORMAL HIGH (ref 11.2–14.5)
WBC: 4.3 10*3/uL (ref 3.9–10.3)
lymph#: 0.5 10*3/uL — ABNORMAL LOW (ref 0.9–3.3)

## 2017-01-05 MED ORDER — ACETAMINOPHEN 325 MG PO TABS
ORAL_TABLET | ORAL | Status: AC
  Filled 2017-01-05: qty 2

## 2017-01-05 MED ORDER — SODIUM CHLORIDE 0.9 % IJ SOLN
10.0000 mL | INTRAMUSCULAR | Status: DC | PRN
  Administered 2017-01-05: 10 mL via INTRAVENOUS
  Filled 2017-01-05: qty 10

## 2017-01-05 MED ORDER — HEPARIN SOD (PORK) LOCK FLUSH 100 UNIT/ML IV SOLN
500.0000 [IU] | Freq: Once | INTRAVENOUS | Status: AC | PRN
  Administered 2017-01-05: 500 [IU]
  Filled 2017-01-05: qty 5

## 2017-01-05 MED ORDER — ACETAMINOPHEN 325 MG PO TABS
650.0000 mg | ORAL_TABLET | Freq: Once | ORAL | Status: AC
  Administered 2017-01-05: 650 mg via ORAL

## 2017-01-05 MED ORDER — DIPHENHYDRAMINE HCL 25 MG PO CAPS
50.0000 mg | ORAL_CAPSULE | Freq: Once | ORAL | Status: AC
  Administered 2017-01-05: 25 mg via ORAL

## 2017-01-05 MED ORDER — SODIUM CHLORIDE 0.9 % IV SOLN
420.0000 mg | Freq: Once | INTRAVENOUS | Status: AC
  Administered 2017-01-05: 420 mg via INTRAVENOUS
  Filled 2017-01-05: qty 14

## 2017-01-05 MED ORDER — DIPHENHYDRAMINE HCL 25 MG PO CAPS
ORAL_CAPSULE | ORAL | Status: AC
  Filled 2017-01-05: qty 1

## 2017-01-05 MED ORDER — FULVESTRANT 250 MG/5ML IM SOLN
500.0000 mg | Freq: Once | INTRAMUSCULAR | Status: AC
  Administered 2017-01-05: 500 mg via INTRAMUSCULAR
  Filled 2017-01-05: qty 10

## 2017-01-05 MED ORDER — SODIUM CHLORIDE 0.9% FLUSH
10.0000 mL | INTRAVENOUS | Status: DC | PRN
  Administered 2017-01-05: 10 mL
  Filled 2017-01-05: qty 10

## 2017-01-05 MED ORDER — SODIUM CHLORIDE 0.9 % IV SOLN
Freq: Once | INTRAVENOUS | Status: AC
  Administered 2017-01-05: 14:00:00 via INTRAVENOUS

## 2017-01-05 MED ORDER — TRASTUZUMAB CHEMO 150 MG IV SOLR
6.0000 mg/kg | Freq: Once | INTRAVENOUS | Status: AC
  Administered 2017-01-05: 378 mg via INTRAVENOUS
  Filled 2017-01-05: qty 18

## 2017-01-05 NOTE — Progress Notes (Addendum)
[facility] Follow up:    Heather Stains, MD [place] [address]   DIAGNOSIS: Cancer Staging Primary cancer of upper outer quadrant of right female breast [facility]) Staging form: Breast, AJCC 7th Edition - Clinical: Stage IIB (T2, N1, cM0) - Unsigned Staging comments: Staged at breast conference 11.6.13  - Pathologic: No stage assigned - Unsigned   SUMMARY OF ONCOLOGIC HISTORY:   Primary cancer of upper outer quadrant of right female breast ([place])   03/08/2012 Initial Diagnosis    Right breast invasive ductal carcinoma ER positive PR negative HER-2 positive Ki-67 44%; another breast mass biopsied in the anterior part of the breast which was also positive for malignancy that was HER-2 negative      05/10/2012 Surgery    Right mastectomy and axillary lymph node dissection: Multifocal disease 5 cm, 1.7 cm, 1.6 cm, ER positive PR negative HER-2 positive Ki-67 44%, 3/7 lymph nodes positive      06/14/2012 - 06/12/2013 Chemotherapy    Adjuvant chemotherapy with [place] 6 followed by Herceptin maintenance      10/25/2012 - 12/11/2012 Radiation Therapy    Adjuvant radiation therapy      02/07/2013 -  Anti-estrogen oral therapy    Letrozole 2.5 mg daily      02/14/2015 Imaging    MRI spine: Large destructive T5 lesion with severe pathologic compression fracture and extensive epidural tumor, severe spinal stenosis with moderate cord compression, tumor involvement of T4      03/18/2015 PET scan    Residual enhancing soft tissue adjacent to the spinal cord. No evidence of metastatic disease. Nonspecific uptake the left nipple      03/27/2015 - 03/30/2015 Hospital Admission    T4-6 decompression for spinal cord compression (lower extremity paralysis)      04/10/2015 -  Chemotherapy    Palliative treatment with Herceptin every 3 weeks with letrozole 2.5 mg daily      04/13/2015 - 05/19/2015 Radiation Therapy    Palliative radiation treatment to  the spine      09/01/2015 Imaging    CT chest abdomen pelvis: Pathologic fracture with posterior fusion at T5 no other evidence of metastatic disease in the chest abdomen pelvis      12/23/2015 Imaging    CT CAP: new nonspecific 0.6 cm lymph node was stated mediastinum needs follow-up CT, innumerable tiny groundglass pulmonary nodules throughout both lungs unchanged, patchy consolidation from radiation, T5 fracture, no mets      04/15/2016 PET scan    Rt para-spinal mass mildy hypermetabolic SUV 5.9, lytic cortical lesion 4.8 cm lesion left prox femur suv 8.8 favor osseus met disease      04/28/2016 - 05/12/2016 Radiation Therapy    Palliative XRT Left femur      05/24/2016 Procedure    Soft tissue mass biopsy back: Metastatic breast cancer ER 80%, PR 0%, Ki-67 50%, HER-2 positive ratio 2.25      06/09/2016 -  Anti-estrogen oral therapy    Faslodex with Herceptin and Perjeta every 4 weeks      06/13/2016 - 06/15/2016 Hospital Admission    uncomplicated revision of previous thoracic instrumentation.      10/10/2016 PET scan    Interval development of new hypermetabolic 2.5 x 1.1 cm lesion posterior right eighth rib consistent with metastatic disease, stable right paraspinal lesion at T8, clustered soft tissue nodules in the posterior midline back near cervicothoracic junction smaller than before  11/03/2016 - 11/07/2016 Radiation Therapy    Palliative radiation to right eighth rib      11/04/2016 Imaging    Solitary contrast-enhancing lesion in the right cerebellum 6 x 2 mm concerning for metastatic lesion      11/24/2016 - 11/24/2016 Radiation Therapy    SRS to the brain lesion       CURRENT THERAPY: Faslodex, Herceptin, Perjeta  INTERVAL HISTORY: Heather [name] 71 y.o. female returns for evaluation prior to receiving her every four week Faslodex, Herceptin, and Perjeta for her metastatic ER/HER-2 positive breast cancer.  She is doing moderately well today.  She has had a rough  month emotionally because her daughter-in-law's mother died from cancer.  She has noted that she is more shaky at times, and one time when this happened and it wouldn't resolve she took a Xanax and it helped.  She wants to know if she can get the Shingix vaccine.  She wants to know if she should schedule a colonoscopy.  She'd like to move her Zometa from 10/5 to 9/27, she also wants to know how we will plan on imaging her during her restaging studies, because Ashlyn and Dr. [name] will need to do an MRI of her spine in addition to her brain if we do not image with PET scan.    Other than her questions above along with her nervousness, Heather [name] is doing quite well.  Her pain is under control, and she underwent her last echo and Dr. Haroldine Laws appt on 11/02/2016.  He has extended her echo and f/u screening appointments to every 6 months instead of every 3 months.  She was delighted with this news.  I saw her a few months ago and she was struggling with depression and dark thoughts, and went to see a counselor.  She is happy that those thoughts have stopped and she is no longer having them.     Patient Active Problem List   Diagnosis Date Noted  . Brain metastasis ([place]) 11/18/2016  . Genetic testing 07/11/2016  . ALLERGY TO CHLORHEXADINE, USE BETADINE ONLY 06/23/2016  . Kyphosis of thoracic region 06/13/2016  . Plantar fasciitis, bilateral 04/20/2016  . Neuropathic pain 01/27/2016  . Anemia of chronic disease 05/21/2015  . Metastatic cancer to spine ([place]) 03/27/2015  . Thrombocytopenia ([place]) 03/12/2015  . Prerenal azotemia 03/12/2015  . Pain   . Recurrent UTI--due to Klebsiella   . Paraplegia at T4 level ([place]) 02/20/2015  . Neurogenic bowel 02/20/2015  . Neurogenic bladder 02/20/2015  . Postoperative anemia due to acute blood loss 02/17/2015  . Spinal cord compression due to malignant neoplasm metastatic to spine ([place]) 02/15/2015  . Epidural mass 02/15/2015  . Thoracic spine tumor   . Pathologic  fracture of vertebra 02/14/2015  . Pathologic fracture of thoracic vertebrae 02/14/2015  . Breast cancer metastasized to bone, right ([place]) 02/14/2015  . Hyperlipidemia 02/14/2015  . H. pylori infection   . HTN (hypertension) 04/17/2012  . Primary cancer of upper outer quadrant of right female breast ([place]) 03/08/2012    is allergic to other; acyclovir and related; zanaflex [tizanidine hcl]; codeine; keflex [cephalexin]; naprosyn [naproxen]; oxycodone; tramadol; and tussin [guaifenesin].  MEDICAL HISTORY: Past Medical History:  Diagnosis Date  . Anemia    hx of  . Anxiety    r/t updated surgery  . Breast cancer ([place])    right  . Cataract    immature-not sure of which eye  . Complication of anesthesia    pt states  she is sensitive to meds  . Dizziness    has been going on for 68month;medical MD aware  . Genetic testing 07/11/2016   Test Results: Normal; No pathogenic mutations detected  Genes Analyzed: 43 genes on [employer]'s Common Cancers panel (APC, ATM, AXIN2, BARD1, BMPR1A, BRCA1, BRCA2, BRIP1, CDH1, CDKN2A, CHEK2, DICER1, EPCAM, GREM1, HOXB13, KIT, MEN1, MLH1, MSH2, MSH6, MUTYH, NBN, NF1, PALB2, PDGFRA, PMS2, POLD1, POLE, PTEN, RAD50, RAD51C, RAD51D, SDHA, SDHB, SDHC, SDHD, SMAD4, SMARCA4, STK11, TP53, TSC1, TSC2, VHL).  .Marland KitchenGERD (gastroesophageal reflux disease)    Tums prn  . H. pylori infection   . H/O hiatal hernia   . Hemorrhoids   . History of UTI   . Hx of radiation therapy 10/25/12- 12/11/12   right chest wall/regional lymph nodes 5040 cGy, 28 sessions, right chest wall boost 1000 cGy 1 session  . Hx: UTI (urinary tract infection)   . Hyperlipidemia    but not on meds;diet and exercise controlled  . Hypertension    recently started Aldactone   . Insomnia    takes Melatoniin daily  . Metastasis to spinal column ([facility])    T5, Left  Femur cancer- radiation   . Neuropathy    "from back surgery"  . PONV (postoperative nausea and vomiting)    pt experienced hair loss,  confusion and combative- 02/15/15  . Right knee pain   . Right shoulder pain   . Skin cancer    squamous, Nodule to back - "same cancer as breast."  . Urinary frequency    d/t taking Aldactone    SURGICAL HISTORY: Past Surgical History:  Procedure Laterality Date  . COLONOSCOPY    . cyst removed from left breast  1970  . DECOMPRESSIVE LUMBAR LAMINECTOMY LEVEL 4 N/A 02/15/2015   Procedure: DECOMPRESSION T5 AND T3-T7 STABALIZATION;  Surgeon: NConsuella Lose MD;  [facility];  Service: Neurosurgery;  Laterality: N/A;  . ESOPHAGOGASTRODUODENOSCOPY    . LAMINECTOMY N/A 03/27/2015   Procedure: Thoracic Four-Thoracic Six Laminectomy for tumor;  Surgeon: NConsuella Lose MD;  [facility];  Service: Neurosurgery;  Laterality: N/A;  T4-T6 Laminectomy  . left wrist surgery   2004   with plate  . MASTECTOMY MODIFIED RADICAL  05/10/2012   Procedure: MASTECTOMY MODIFIED RADICAL;  Surgeon: PMerrie Roof MD;  [facility]  Service: General;  Laterality: Right;  RIGHT MODIFIED RADICAL MASTECTOMY  . MASTECTOMY, RADICAL Right   . MINOR BREAST BIOPSY Left 05/16/2016   Procedure: EXCISION OF BACK NODULE;  Surgeon: PAutumn MessingIII, MD;  [facility]  Service: General;  Laterality: Left;  . Nodule  back  05/17/2015  . PORTACATH PLACEMENT  05/10/2012   Procedure: INSERTION PORT-A-CATH;  Surgeon: PMerrie Roof MD;  [facility]  Service: General;  Laterality: Left;    SOCIAL HISTORY: Social History   Social History  . Marital status: Married    Spouse name: N/A  . Number of children: 2  . Years of education: N/A   Occupational History  . Not on file.   Social History Main Topics  . Smoking status: Never Smoker  . Smokeless tobacco: Never Used  . Alcohol use No  . Drug use: No  . Sexual activity: Yes    Birth control/ protection: Post-menopausal   Other Topics Concern  . Not on file   Social History Narrative  . No narrative on file     FAMILY HISTORY: Family History  Problem  Relation Age of Onset  . Leukemia Mother 35       deceased 64  . Stroke Father   . Diabetes Mellitus II Father   . Prostate cancer Brother 30       currently 73  . Stomach cancer Maternal Grandmother 51       deceased 35  . Prostate cancer Brother 28       currently 75    Review of Systems  Constitutional: Negative for chills, fatigue and unexpected weight change.  HENT:   Negative for hearing loss and lump/mass.   Eyes: Negative for eye problems and icterus.  Respiratory: Negative for chest tightness and cough.   Cardiovascular: Negative for chest pain, leg swelling and palpitations.  Gastrointestinal: Negative for abdominal distention and abdominal pain.  Endocrine: Negative for hot flashes.  Genitourinary: Negative for difficulty urinating.   Musculoskeletal: Negative for arthralgias and neck pain.  Skin: Negative for itching and rash.  Neurological: Negative for dizziness, extremity weakness and headaches.  Hematological: Negative for adenopathy. Does not bruise/bleed easily.  Psychiatric/Behavioral: Negative for depression. The patient is nervous/anxious.       PHYSICAL EXAMINATION  ECOG PERFORMANCE STATUS: 2 - Symptomatic, <50% confined to bed  Vitals:   01/05/17 1121  BP: (!) 147/68  Pulse: 73  Resp: 18  Temp: 97.7 F (36.5 C)  SpO2: 99%    Physical Exam  Constitutional: She is oriented to person, place, and time and well-developed, well-nourished, and in no distress.  HENT:  Head: Normocephalic and atraumatic.  Mouth/Throat: No oropharyngeal exudate.  Eyes: Pupils are equal, round, and reactive to light. No scleral icterus.  Neck: Neck supple.  Cardiovascular: Normal rate, regular rhythm and normal heart sounds.   Pulmonary/Chest: Effort normal and breath sounds normal.  Abdominal: Soft. Bowel sounds are normal.  Musculoskeletal: She exhibits no edema.  Lymphadenopathy:    She has no cervical adenopathy.   Neurological: She is alert and oriented to person, place, and time.  Skin: Skin is warm and dry. No rash noted.  Psychiatric: Mood and affect normal.    LABORATORY DATA:  CBC    Component Value Date/Time   WBC 4.3 01/05/2017 1003   WBC 6.4 06/14/2016 0413   RBC 4.67 01/05/2017 1003   RBC 3.64 (L) 06/14/2016 0413   HGB 12.8 01/05/2017 1003   HCT 38.5 01/05/2017 1003   PLT 191 01/05/2017 1003   MCV 82.5 01/05/2017 1003   MCH 27.4 01/05/2017 1003   MCH 28.6 06/14/2016 0413   MCHC 33.2 01/05/2017 1003   MCHC 33.3 06/14/2016 0413   RDW 15.7 (H) 01/05/2017 1003   LYMPHSABS 0.5 (L) 01/05/2017 1003   MONOABS 0.3 01/05/2017 1003   EOSABS 0.1 01/05/2017 1003   BASOSABS 0.0 01/05/2017 1003    CMP     Component Value Date/Time   NA 138 01/05/2017 1003   K 4.0 01/05/2017 1003   CL 103 06/14/2016 0413   CL 101 10/25/2012 0946   CO2 27 01/05/2017 1003   GLUCOSE 101 01/05/2017 1003   GLUCOSE 90 10/25/2012 0946   BUN 13.4 01/05/2017 1003   CREATININE 0.8 01/05/2017 1003   CALCIUM 10.0 01/05/2017 1003   PROT 7.6 01/05/2017 1003   ALBUMIN 4.0 01/05/2017 1003   AST 30 01/05/2017 1003   ALT 20 01/05/2017 1003   ALKPHOS 80 01/05/2017 1003   BILITOT 0.66 01/05/2017 1003   GFRNONAA >60 06/14/2016 0413   GFRAA >60 06/14/2016 0413     ASSESSMENT and THERAPY  PLAN:   Primary cancer of upper outer quadrant of right female breast ([place]) Right breast invasive ductal carcinoma ER positive PR negative HER-2 positive Ki-67 44% multifocal disease 3/7 lymph nodes positive T2 N1 M0 stage IIB status post adjuvant chemotherapy with TCH followed by Herceptin maintenance, adjuvant radiation therapy and Letrozole. MRI 02/14/2015: T5 large destructive lesion with pathologic compression fracture and extensive epidural tumor, involvement of T4, excision metastatic carcinoma ER 60%, PR 0%, HER-2 positive ratio 2.71 average copy #7.45 03/27/2015: T4-6 decompression surgery for spinal cord  compression PET-CT 12/10/17Rt para-spinal mass mildy hypermetabolic SUV 5.9, lytic cortical lesion 4.8 cm lesion left prox femur suv 8.8 favor osseus met disease   Treatment summary: 1. Resumed anastrozole 05/21/2015, but it has caused significant hair loss so we switched her to exemestane 25 mg once daily 07/23/2015 2. Herceptin every 3 week started 04/10/2015 3. Bone metastases: Zometa every 3 months. 4. Radiation therapy to the spine (Dr. [name]) 04/13/2015 to 05/19/2015 5. Radiation therapy to left femur 04/28/2016 to 05/12/2016  -------------------------------------------------------------------------------------------------------------------------- Goal of treatment: Palliation Treatment plan:Faslodex along with Herceptin and Perjeta first dose of Faslodex given 06/09/2016 every 4 weeks  PET/CT scan 10/10/2016: Interval development of new hypermetabolic 2.5 cm lesion in the posterior right eighth rib, stable right paraspinal lesion at T8, clustered soft tissue nodules decreased uptake compared to December Brain MRI: 11/04/2016:Solitary contrast-enhancing lesion in the right cerebellum 6 x 2 mm concerning for metastatic lesion  Treatment plan: SRS to the brain lesion 11/24/2016  Continue with current treatment.  We will restage with CT chest, abdomen and pelvis.  She can receive the shingles vaccine, however would hold off on colonoscopy, and we will re visit in 1-2 years.  She can certainly receive her Zometa on 9/27 instead of 10/5 and we will cancel that 10/5 appointment.  I spent a great deal of time talking to patient about her concerns and giving her reassurance.    Patient will receive Herceptin Perjeta Faslodex today.   A total of (30) minutes of face-to-face time was spent with this patient with greater than 50% of that time in counseling and care-coordination.  All questions were answered. The patient knows to call the clinic with any problems, questions or concerns. We  can certainly see the patient much sooner if necessary.  This note was electronically signed. Scot Dock, NP 01/05/2017   Attending Note  I personally saw and examined Heather [name]. The plan of care was discussed with her. I agree with the assessment and plan as documented above. Plan is to obtain CT chest, abdomen and pelvis next month Continue with Herceptin and Perjeta along with Faslodex Monitoring closely for toxicities. I spent an additional 30 minutes discussing the treatment plan with the patient.  Signed Rulon Eisenmenger, MD

## 2017-01-05 NOTE — Assessment & Plan Note (Addendum)
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 MRI 02/14/2015: T5 large destructive lesion with pathologic compression fracture and extensive epidural tumor, involvement of T4, excision metastatic carcinoma ER 60%, PR 0%, HER-2 positive ratio 2.71 average copy #7.45 03/27/2015: T4-6 decompression surgery for spinal cord compression PET-CT 12/10/17Rt para-spinal mass mildy hypermetabolic SUV 5.9, lytic cortical lesion 4.8 cm lesion left prox femur suv 8.8 favor osseus met disease   Treatment summary: 1. Resumed anastrozole 05/21/2015, but it has caused significant hair loss so we switched her to exemestane 25 mg once daily 07/23/2015 2. Herceptin every 3 week started 04/10/2015 3. Bone metastases: Zometa every 3 months. 4. Radiation therapy to the spine (Dr. [name]) 04/13/2015 to 05/19/2015 5. Radiation therapy to left femur 04/28/2016 to 05/12/2016  -------------------------------------------------------------------------------------------------------------------------- Goal of treatment: Palliation Treatment plan:Faslodex along with Herceptin and Perjeta first dose of Faslodex given 06/09/2016 every 4 weeks  PET/CT scan 10/10/2016: Interval development of new hypermetabolic 2.5 cm lesion in the posterior right eighth rib, stable right paraspinal lesion at T8, clustered soft tissue nodules decreased uptake compared to December Brain MRI: 11/04/2016:Solitary contrast-enhancing lesion in the right cerebellum 6 x 2 mm concerning for metastatic lesion  Treatment plan: SRS to the brain lesion 11/24/2016  Continue with current treatment.  We will restage with CT chest, abdomen and pelvis.  She can receive the shingles vaccine, however would hold off on colonoscopy, and we  will re visit in 1-2 years.  She can certainly receive her Zometa on 9/27 instead of 10/5 and we will cancel that 10/5 appointment.  I spent a great deal of time talking to patient about her concerns and giving her reassurance.    Patient will receive Herceptin Perjeta Faslodex today.

## 2017-01-05 NOTE — Patient Instructions (Signed)
Pertuzumab injection What is this medicine? PERTUZUMAB (per TOOZ ue mab) is a monoclonal antibody. It is used to treat breast cancer. This medicine may be used for other purposes; ask your health care provider or pharmacist if you have questions. COMMON BRAND NAME(S): PERJETA What should I tell my health care provider before I take this medicine? They need to know if you have any of these conditions: -heart disease -heart failure -high blood pressure -history of irregular heart beat -recent or ongoing radiation therapy -an unusual or allergic reaction to pertuzumab, other medicines, foods, dyes, or preservatives -pregnant or trying to get pregnant -breast-feeding How should I use this medicine? This medicine is for infusion into a vein. It is given by a health care professional in a hospital or clinic setting. Talk to your pediatrician regarding the use of this medicine in children. Special care may be needed. Overdosage: If you think you have taken too much of this medicine contact a poison control center or emergency room at once. NOTE: This medicine is only for you. Do not share this medicine with others. What if I miss a dose? It is important not to miss your dose. Call your doctor or health care professional if you are unable to keep an appointment. What may interact with this medicine? Interactions are not expected. Give your health care provider a list of all the medicines, herbs, non-prescription drugs, or dietary supplements you use. Also tell them if you smoke, drink alcohol, or use illegal drugs. Some items may interact with your medicine. This list may not describe all possible interactions. Give your health care provider a list of all the medicines, herbs, non-prescription drugs, or dietary supplements you use. Also tell them if you smoke, drink alcohol, or use illegal drugs. Some items may interact with your medicine. What should I watch for while using this medicine? Your  condition will be monitored carefully while you are receiving this medicine. Report any side effects. Continue your course of treatment even though you feel ill unless your doctor tells you to stop. Do not become pregnant while taking this medicine or for 7 months after stopping it. Women should inform their doctor if they wish to become pregnant or think they might be pregnant. Women of child-bearing potential will need to have a negative pregnancy test before starting this medicine. There is a potential for serious side effects to an unborn child. Talk to your health care professional or pharmacist for more information. Do not breast-feed an infant while taking this medicine or for 7 months after stopping it. Women must use effective birth control with this medicine. Call your doctor or health care professional for advice if you get a fever, chills or sore throat, or other symptoms of a cold or flu. Do not treat yourself. Try to avoid being around people who are sick. You may experience fever, chills, and headache during the infusion. Report any side effects during the infusion to your health care professional. What side effects may I notice from receiving this medicine? Side effects that you should report to your doctor or health care professional as soon as possible: -breathing problems -chest pain or palpitations -dizziness -feeling faint or lightheaded -fever or chills -skin rash, itching or hives -sore throat -swelling of the face, lips, or tongue -swelling of the legs or ankles -unusually weak or tired Side effects that usually do not require medical attention (report to your doctor or health care professional if they continue or are bothersome): -diarrhea -hair  loss -nausea, vomiting -tiredness This list may not describe all possible side effects. Call your doctor for medical advice about side effects. You may report side effects to FDA at 1-800-FDA-1088. Where should I keep my  medicine? This drug is given in a hospital or clinic and will not be stored at home. NOTE: This sheet is a summary. It may not cover all possible information. If you have questions about this medicine, talk to your doctor, pharmacist, or health care provider.  2018 Elsevier/Gold Standard (2015-05-28 12:08:50) Trastuzumab injection for infusion What is this medicine? TRASTUZUMAB (tras TOO zoo mab) is a monoclonal antibody. It is used to treat breast cancer and stomach cancer. This medicine may be used for other purposes; ask your health care provider or pharmacist if you have questions. COMMON BRAND NAME(S): Herceptin What should I tell my health care provider before I take this medicine? They need to know if you have any of these conditions: -heart disease -heart failure -lung or breathing disease, like asthma -an unusual or allergic reaction to trastuzumab, benzyl alcohol, or other medications, foods, dyes, or preservatives -pregnant or trying to get pregnant -breast-feeding How should I use this medicine? This drug is given as an infusion into a vein. It is administered in a hospital or clinic by a specially trained health care professional. Talk to your pediatrician regarding the use of this medicine in children. This medicine is not approved for use in children. Overdosage: If you think you have taken too much of this medicine contact a poison control center or emergency room at once. NOTE: This medicine is only for you. Do not share this medicine with others. What if I miss a dose? It is important not to miss a dose. Call your doctor or health care professional if you are unable to keep an appointment. What may interact with this medicine? This medicine may interact with the following medications: -certain types of chemotherapy, such as daunorubicin, doxorubicin, epirubicin, and idarubicin This list may not describe all possible interactions. Give your health care provider a list of  all the medicines, herbs, non-prescription drugs, or dietary supplements you use. Also tell them if you smoke, drink alcohol, or use illegal drugs. Some items may interact with your medicine. What should I watch for while using this medicine? Visit your doctor for checks on your progress. Report any side effects. Continue your course of treatment even though you feel ill unless your doctor tells you to stop. Call your doctor or health care professional for advice if you get a fever, chills or sore throat, or other symptoms of a cold or flu. Do not treat yourself. Try to avoid being around people who are sick. You may experience fever, chills and shaking during your first infusion. These effects are usually mild and can be treated with other medicines. Report any side effects during the infusion to your health care professional. Fever and chills usually do not happen with later infusions. Do not become pregnant while taking this medicine or for 7 months after stopping it. Women should inform their doctor if they wish to become pregnant or think they might be pregnant. Women of child-bearing potential will need to have a negative pregnancy test before starting this medicine. There is a potential for serious side effects to an unborn child. Talk to your health care professional or pharmacist for more information. Do not breast-feed an infant while taking this medicine or for 7 months after stopping it. Women must use effective birth control  with this medicine. What side effects may I notice from receiving this medicine? Side effects that you should report to your doctor or health care professional as soon as possible: -allergic reactions like skin rash, itching or hives, swelling of the face, lips, or tongue -chest pain or palpitations -cough -dizziness -feeling faint or lightheaded, falls -fever -general ill feeling or flu-like symptoms -signs of worsening heart failure like breathing problems;  swelling in your legs and feet -unusually weak or tired Side effects that usually do not require medical attention (report to your doctor or health care professional if they continue or are bothersome): -bone pain -changes in taste -diarrhea -joint pain -nausea/vomiting -weight loss This list may not describe all possible side effects. Call your doctor for medical advice about side effects. You may report side effects to FDA at 1-800-FDA-1088. Where should I keep my medicine? This drug is given in a hospital or clinic and will not be stored at home. NOTE: This sheet is a summary. It may not cover all possible information. If you have questions about this medicine, talk to your doctor, pharmacist, or health care provider.  2018 Elsevier/Gold Standard (2016-04-19 14:37:52)

## 2017-01-10 ENCOUNTER — Ambulatory Visit: Payer: Medicare Other | Attending: Neurosurgery | Admitting: Physical Therapy

## 2017-01-10 DIAGNOSIS — R2681 Unsteadiness on feet: Secondary | ICD-10-CM | POA: Insufficient documentation

## 2017-01-10 DIAGNOSIS — S24151A Other incomplete lesion at T1 level of thoracic spinal cord, initial encounter: Secondary | ICD-10-CM | POA: Insufficient documentation

## 2017-01-10 DIAGNOSIS — R29898 Other symptoms and signs involving the musculoskeletal system: Secondary | ICD-10-CM | POA: Insufficient documentation

## 2017-01-10 DIAGNOSIS — R29810 Facial weakness: Secondary | ICD-10-CM | POA: Diagnosis not present

## 2017-01-10 DIAGNOSIS — M6289 Other specified disorders of muscle: Secondary | ICD-10-CM | POA: Diagnosis not present

## 2017-01-10 DIAGNOSIS — Z7409 Other reduced mobility: Secondary | ICD-10-CM | POA: Insufficient documentation

## 2017-01-10 DIAGNOSIS — G8220 Paraplegia, unspecified: Secondary | ICD-10-CM | POA: Diagnosis not present

## 2017-01-10 DIAGNOSIS — R269 Unspecified abnormalities of gait and mobility: Secondary | ICD-10-CM | POA: Insufficient documentation

## 2017-01-10 DIAGNOSIS — H8111 Benign paroxysmal vertigo, right ear: Secondary | ICD-10-CM | POA: Insufficient documentation

## 2017-01-10 DIAGNOSIS — R2689 Other abnormalities of gait and mobility: Secondary | ICD-10-CM | POA: Insufficient documentation

## 2017-01-10 DIAGNOSIS — R293 Abnormal posture: Secondary | ICD-10-CM | POA: Insufficient documentation

## 2017-01-10 DIAGNOSIS — M546 Pain in thoracic spine: Secondary | ICD-10-CM | POA: Diagnosis not present

## 2017-01-10 DIAGNOSIS — R531 Weakness: Secondary | ICD-10-CM | POA: Diagnosis not present

## 2017-01-10 DIAGNOSIS — M5414 Radiculopathy, thoracic region: Secondary | ICD-10-CM | POA: Diagnosis not present

## 2017-01-10 DIAGNOSIS — Z853 Personal history of malignant neoplasm of breast: Secondary | ICD-10-CM | POA: Diagnosis not present

## 2017-01-10 DIAGNOSIS — R52 Pain, unspecified: Secondary | ICD-10-CM | POA: Insufficient documentation

## 2017-01-10 DIAGNOSIS — I10 Essential (primary) hypertension: Secondary | ICD-10-CM | POA: Diagnosis not present

## 2017-01-10 DIAGNOSIS — Z87440 Personal history of urinary (tract) infections: Secondary | ICD-10-CM | POA: Diagnosis not present

## 2017-01-10 NOTE — Therapy (Addendum)
[facility] [place], [place], [address] Phone: [phone]   Fax:  [phone]  Physical Therapy Treatment  Patient Details  Name: Heather [name] MRN: [id number] Date of Birth: [birth year] Referring Provider: Consuella Lose MD  Encounter Date: 01/10/2017      PT End of Session - 01/10/17 1147    Visit Number 3   Number of Visits 16   Date for PT Re-Evaluation 02/20/17   PT Start Time 0945   PT Stop Time 1029   PT Time Calculation (min) 44 min   Activity Tolerance Patient tolerated treatment well;Patient limited by fatigue   Behavior During Therapy [facility] for tasks assessed/performed      Past Medical History:  Diagnosis Date  . Anemia    hx of  . Anxiety    r/t updated surgery  . Breast cancer ([place])    right  . Cataract    immature-not sure of which eye  . Complication of anesthesia    pt states she is sensitive to meds  . Dizziness    has been going on for 60month;medical MD aware  . Genetic testing 07/11/2016   Test Results: Normal; No pathogenic mutations detected  Genes Analyzed: 43 genes on [employer]'s Common Cancers panel (APC, ATM, AXIN2, BARD1, BMPR1A, BRCA1, BRCA2, BRIP1, CDH1, CDKN2A, CHEK2, DICER1, EPCAM, GREM1, HOXB13, KIT, MEN1, MLH1, MSH2, MSH6, MUTYH, NBN, NF1, PALB2, PDGFRA, PMS2, POLD1, POLE, PTEN, RAD50, RAD51C, RAD51D, SDHA, SDHB, SDHC, SDHD, SMAD4, SMARCA4, STK11, TP53, TSC1, TSC2, VHL).  .Marland KitchenGERD (gastroesophageal reflux disease)    Tums prn  . H. pylori infection   . H/O hiatal hernia   . Hemorrhoids   . History of UTI   . Hx of radiation therapy 10/25/12- 12/11/12   right chest wall/regional lymph nodes 5040 cGy, 28 sessions, right chest wall boost 1000 cGy 1 session  . Hx: UTI (urinary tract infection)   . Hyperlipidemia    but not on meds;diet and exercise controlled  . Hypertension    recently started Aldactone   . Insomnia    takes Melatoniin daily  . Metastasis to spinal column (HCC)    T5, Left  Femur cancer- radiation   . Neuropathy    "from back surgery"  . PONV (postoperative nausea and vomiting)    pt experienced hair loss, confusion and combative- 02/15/15  . Right knee pain   . Right shoulder pain   . Skin cancer    squamous, Nodule to back - "same cancer as breast."  . Urinary frequency    d/t taking Aldactone    Past Surgical History:  Procedure Laterality Date  . COLONOSCOPY    . cyst removed from left breast  1970  . DECOMPRESSIVE LUMBAR LAMINECTOMY LEVEL 4 N/A 02/15/2015   Procedure: DECOMPRESSION T5 AND T3-T7 STABALIZATION;  Surgeon: NConsuella Lose MD;  [facility];  Service: Neurosurgery;  Laterality: N/A;  . ESOPHAGOGASTRODUODENOSCOPY    . LAMINECTOMY N/A 03/27/2015   Procedure: Thoracic Four-Thoracic Six Laminectomy for tumor;  Surgeon: NConsuella Lose MD;  [facility];  Service: Neurosurgery;  Laterality: N/A;  T4-T6 Laminectomy  . left wrist surgery   2004   with plate  . MASTECTOMY MODIFIED RADICAL  05/10/2012   Procedure: MASTECTOMY MODIFIED RADICAL;  Surgeon: PMerrie Roof MD;  [place] [facility]  Service: General;  Laterality: Right;  RIGHT MODIFIED RADICAL MASTECTOMY  . MASTECTOMY, RADICAL Right   . MINOR BREAST BIOPSY Left 05/16/2016  Procedure: EXCISION OF BACK NODULE;  Surgeon: Autumn Messing III, MD;  Location: [place];  Service: General;  Laterality: Left;  . Nodule  back  05/17/2015  . PORTACATH PLACEMENT  05/10/2012   Procedure: INSERTION PORT-A-CATH;  Surgeon: Merrie Roof, MD;  Location: [place];  Service: General;  Laterality: Left;    There were no vitals filed for this visit.      Subjective Assessment - 01/10/17 1042    Subjective I was sore after that treatment.   Pain Score 2    Pain Location Back   Pain Orientation Right;Left   Pain Descriptors / Indicators Sore   Pain Type Chronic pain   Pain Onset More than a month ago                         [facility] Adult PT  Treatment/Exercise - 01/10/17 0001      Exercises   Exercises Knee/Hip     Knee/Hip Exercises: Aerobic   Nustep level 4 x 15 minutes.     Knee/Hip Exercises: Machines for Strengthening   Cybex Knee Extension 10# x 3 minutes.   Cybex Knee Flexion 40# x 3 minutes.     Knee/Hip Exercises: Standing   Other Standing Knee Exercises Green XTS scap retraction 2 x 10; shoulder extension 2 x10 and punches.     Knee/Hip Exercises: Supine   Other Supine Knee/Hip Exercises Hip bridges 2 x 10; reverse curls 2 x10.             Balance Exercises - 01/10/17 1044      Balance Exercises: Standing   Other Standing Exercises Rockerb[place]ad in parallel bars x 5 minutes.           PT Education - 01/10/17 1142    Education provided Yes   Education Details HEP.  Patient provided with red Theraband.   Person(s) Educated Patient   Methods Explanation;Demonstration;Tactile cues;Verbal cues;Handout   Comprehension Verbalized understanding;Returned demonstration;Verbal cues required;Tactile cues required;Need further instruction          PT Short Term Goals - 12/22/16 1808      PT SHORT TERM GOAL #1   Title STG's=LTG's.           PT Long Term Goals - 12/22/16 1810      PT LONG TERM GOAL #1   Title Independent with a HEP.   Time 8   Period Weeks   Status New     PT LONG TERM GOAL #2   Title Increase thoracic strength to a solid 5/5.   Time 8   Period Weeks   Status New     PT LONG TERM GOAL #3   Title Negative Romberg test.   Time 8   Period Weeks   Status New     PT LONG TERM GOAL #4   Title Improve Berg score to 49-50/56.   Time 8   Period Weeks   Status New               Plan - 01/10/17 1152    Clinical Impression Statement Excellent job today.  As part od patient's HEP we included SLS next to countertop for safety.      Patient will benefit from skilled therapeutic intervention in order to improve the following deficits and impairments:     Visit  Diagnosis: Pain in thoracic spine  Unsteadiness on feet     Problem List Patient Active Problem  List   Diagnosis Date Noted  . Brain metastasis ([place]) 11/18/2016  . Genetic testing 07/11/2016  . ALLERGY TO CHLORHEXADINE, USE BETADINE ONLY 06/23/2016  . Kyphosis of thoracic region 06/13/2016  . Plantar fasciitis, bilateral 04/20/2016  . Neuropathic pain 01/27/2016  . Anemia of chronic disease 05/21/2015  . Metastatic cancer to spine ([place]) 03/27/2015  . Thrombocytopenia ([place]) 03/12/2015  . Prerenal azotemia 03/12/2015  . Pain   . Recurrent UTI--due to Klebsiella   . Paraplegia at T4 level ([place]) 02/20/2015  . Neurogenic bowel 02/20/2015  . Neurogenic bladder 02/20/2015  . Postoperative anemia due to acute blood loss 02/17/2015  . Spinal cord compression due to malignant neoplasm metastatic to spine ([place]) 02/15/2015  . Epidural mass 02/15/2015  . Thoracic spine tumor   . Pathologic fracture of vertebra 02/14/2015  . Pathologic fracture of thoracic vertebrae 02/14/2015  . Breast cancer metastasized to bone, right ([place]) 02/14/2015  . Hyperlipidemia 02/14/2015  . H. pylori infection   . HTN (hypertension) 04/17/2012  . Primary cancer of upper outer quadrant of right female breast ([place]) 03/08/2012    Ruthel Martine, [place] MPT 01/10/2017, 12:12 PM  [facility] [address] [place], [place], [address] Phone: [phone]   Fax:  [phone]  Name: FRANZISKA PODGURSKI MRN: [id number] Date of Birth: 30-Jun-1945

## 2017-01-17 ENCOUNTER — Ambulatory Visit: Payer: Medicare Other | Admitting: Physical Therapy

## 2017-01-17 ENCOUNTER — Encounter: Payer: Self-pay | Admitting: Physical Therapy

## 2017-01-17 DIAGNOSIS — R2681 Unsteadiness on feet: Secondary | ICD-10-CM | POA: Diagnosis not present

## 2017-01-17 DIAGNOSIS — R269 Unspecified abnormalities of gait and mobility: Secondary | ICD-10-CM | POA: Diagnosis not present

## 2017-01-17 DIAGNOSIS — M546 Pain in thoracic spine: Secondary | ICD-10-CM | POA: Diagnosis not present

## 2017-01-17 DIAGNOSIS — M6281 Muscle weakness (generalized): Secondary | ICD-10-CM

## 2017-01-17 DIAGNOSIS — M5414 Radiculopathy, thoracic region: Secondary | ICD-10-CM | POA: Diagnosis not present

## 2017-01-17 DIAGNOSIS — R29810 Facial weakness: Secondary | ICD-10-CM | POA: Diagnosis not present

## 2017-01-17 DIAGNOSIS — R293 Abnormal posture: Secondary | ICD-10-CM | POA: Diagnosis not present

## 2017-01-17 NOTE — Therapy (Signed)
[facility] [place], [place], [address] Phone: [phone]   Fax:  [phone]  Physical Therapy Treatment  Patient Details  Name: Heather [name] MRN: [id number] Date of Birth: 10-30-1945 Referring Provider: Consuella Lose MD  Encounter Date: 01/17/2017      PT End of Session - 01/17/17 1059    Visit Number 4   Number of Visits 16   Date for PT Re-Evaluation 02/20/17   PT Start Time 0953   PT Stop Time 1037   PT Time Calculation (min) 44 min   Activity Tolerance Patient tolerated treatment well;Patient limited by fatigue   Behavior During Therapy [facility] for tasks assessed/performed      Past Medical History:  Diagnosis Date  . Anemia    hx of  . Anxiety    r/t updated surgery  . Breast cancer ([place])    right  . Cataract    immature-not sure of which eye  . Complication of anesthesia    pt states she is sensitive to meds  . Dizziness    has been going on for 60month;medical MD aware  . Genetic testing 07/11/2016   Test Results: Normal; No pathogenic mutations detected  Genes Analyzed: 43 genes on [employer]'s Common Cancers panel (APC, ATM, AXIN2, BARD1, BMPR1A, BRCA1, BRCA2, BRIP1, CDH1, CDKN2A, CHEK2, DICER1, EPCAM, GREM1, HOXB13, KIT, MEN1, MLH1, MSH2, MSH6, MUTYH, NBN, NF1, PALB2, PDGFRA, PMS2, POLD1, POLE, PTEN, RAD50, RAD51C, RAD51D, SDHA, SDHB, SDHC, SDHD, SMAD4, SMARCA4, STK11, TP53, TSC1, TSC2, VHL).  .Marland KitchenGERD (gastroesophageal reflux disease)    Tums prn  . H. pylori infection   . H/O hiatal hernia   . Hemorrhoids   . History of UTI   . Hx of radiation therapy 10/25/12- 12/11/12   right chest wall/regional lymph nodes 5040 cGy, 28 sessions, right chest wall boost 1000 cGy 1 session  . Hx: UTI (urinary tract infection)   . Hyperlipidemia    but not on meds;diet and exercise controlled  . Hypertension    recently started Aldactone   . Insomnia    takes Melatoniin daily  . Metastasis to spinal column (HCC)     T5, Left  Femur cancer- radiation   . Neuropathy    "from back surgery"  . PONV (postoperative nausea and vomiting)    pt experienced hair loss, confusion and combative- 02/15/15  . Right knee pain   . Right shoulder pain   . Skin cancer    squamous, Nodule to back - "same cancer as breast."  . Urinary frequency    d/t taking Aldactone    Past Surgical History:  Procedure Laterality Date  . COLONOSCOPY    . cyst removed from left breast  1970  . DECOMPRESSIVE LUMBAR LAMINECTOMY LEVEL 4 N/A 02/15/2015   Procedure: DECOMPRESSION T5 AND T3-T7 STABALIZATION;  Surgeon: NConsuella Lose MD;  [facility];  Service: Neurosurgery;  Laterality: N/A;  . ESOPHAGOGASTRODUODENOSCOPY    . LAMINECTOMY N/A 03/27/2015   Procedure: Thoracic Four-Thoracic Six Laminectomy for tumor;  Surgeon: NConsuella Lose MD;  [facility];  Service: Neurosurgery;  Laterality: N/A;  T4-T6 Laminectomy  . left wrist surgery   2004   with plate  . MASTECTOMY MODIFIED RADICAL  05/10/2012   Procedure: MASTECTOMY MODIFIED RADICAL;  Surgeon: PMerrie Roof MD;  [place]  Service: General;  Laterality: Right;  RIGHT MODIFIED RADICAL MASTECTOMY  . MASTECTOMY, RADICAL Right   . MINOR BREAST BIOPSY Left 05/16/2016  Procedure: EXCISION OF BACK NODULE;  Surgeon: Autumn Messing III, MD;  Location: [place];  Service: General;  Laterality: Left;  . Nodule  back  05/17/2015  . PORTACATH PLACEMENT  05/10/2012   Procedure: INSERTION PORT-A-CATH;  Surgeon: Merrie Roof, MD;  Location: [place];  Service: General;  Laterality: Left;    There were no vitals filed for this visit.      Subjective Assessment - 01/17/17 1103    Subjective I'm doing fine.   Pertinent History Cancer (still active).  Neuropathy.     Patient Stated Goals Get stronger.   Pain Score 2    Pain Location Back   Pain Orientation Right;Left   Pain Descriptors / Indicators Sore   Pain Type Chronic pain   Pain  Onset More than a month ago                         [facility] Adult PT Treatment/Exercise - 01/17/17 0001      Exercises   Exercises Knee/Hip     Knee/Hip Exercises: Aerobic   Nustep Level 5 x 15 minutes.     Knee/Hip Exercises: Machines for Strengthening   Cybex Knee Extension 20# x 4 minutes.     Knee/Hip Exercises: Seated   Knee/Hip Flexion Seated knee flexion with red theraband with slow and controlled motion both concentrically and eccentrically to fatigue bilaterally.             Balance Exercises - 01/17/17 1107      Balance Exercises: Standing   Other Standing Exercises Inverted BOSU ball in parallel bars x 5 minutes.  Resisted XTS (red) 4 way walking 2 minutes each way (8 minutes total).             PT Short Term Goals - 12/22/16 1808      PT SHORT TERM GOAL #1   Title STG's=LTG's.           PT Long Term Goals - 12/22/16 1810      PT LONG TERM GOAL #1   Title Independent with a HEP.   Time 8   Period Weeks   Status New     PT LONG TERM GOAL #2   Title Increase thoracic strength to a solid 5/5.   Time 8   Period Weeks   Status New     PT LONG TERM GOAL #3   Title Negative Romberg test.   Time 8   Period Weeks   Status New     PT LONG TERM GOAL #4   Title Improve Berg score to 49-50/56.   Time 8   Period Weeks   Status New               Plan - 01/17/17 1109    Clinical Impression Statement Patient doing well but continues to c/o her knees "snapping" back when walking.  Please add standing TKE's.      Patient will benefit from skilled therapeutic intervention in order to improve the following deficits and impairments:     Visit Diagnosis: Pain in thoracic spine  Unsteadiness on feet  Muscle weakness (generalized)     Problem List Patient Active Problem List   Diagnosis Date Noted  . Brain metastasis ([place]) 11/18/2016  . Genetic testing 07/11/2016  . ALLERGY TO CHLORHEXADINE, USE BETADINE ONLY  06/23/2016  . Kyphosis of thoracic region 06/13/2016  . Plantar fasciitis, bilateral 04/20/2016  . Neuropathic pain 01/27/2016  .  Anemia of chronic disease 05/21/2015  . Metastatic cancer to spine ([place]) 03/27/2015  . Thrombocytopenia ([place]) 03/12/2015  . Prerenal azotemia 03/12/2015  . Pain   . Recurrent UTI--due to Klebsiella   . Paraplegia at T4 level ([place]) 02/20/2015  . Neurogenic bowel 02/20/2015  . Neurogenic bladder 02/20/2015  . Postoperative anemia due to acute blood loss 02/17/2015  . Spinal cord compression due to malignant neoplasm metastatic to spine ([place]) 02/15/2015  . Epidural mass 02/15/2015  . Thoracic spine tumor   . Pathologic fracture of vertebra 02/14/2015  . Pathologic fracture of thoracic vertebrae 02/14/2015  . Breast cancer metastasized to bone, right ([place]) 02/14/2015  . Hyperlipidemia 02/14/2015  . H. pylori infection   . HTN (hypertension) 04/17/2012  . Primary cancer of upper outer quadrant of right female breast ([place]) 03/08/2012    Courtny Bennison, [place] MPT 01/17/2017, 11:12 AM  [facility] [place], [place], [address] Phone: [phone]   Fax:  [phone]  Name: Heather [name] MRN: [id number] Date of Birth: [birth year]

## 2017-01-18 ENCOUNTER — Ambulatory Visit (HOSPITAL_COMMUNITY)
Admission: RE | Admit: 2017-01-18 | Discharge: 2017-01-18 | Disposition: A | Discharge status: Home / Self Care | Payer: Medicare Other | Source: Ambulatory Visit | Attending: Adult Health | Admitting: Adult Health

## 2017-01-18 ENCOUNTER — Encounter (HOSPITAL_COMMUNITY): Payer: Self-pay

## 2017-01-18 DIAGNOSIS — C7951 Secondary malignant neoplasm of bone: Secondary | ICD-10-CM | POA: Insufficient documentation

## 2017-01-18 DIAGNOSIS — C50911 Malignant neoplasm of unspecified site of right female breast: Secondary | ICD-10-CM | POA: Diagnosis not present

## 2017-01-18 DIAGNOSIS — R079 Chest pain, unspecified: Secondary | ICD-10-CM | POA: Diagnosis not present

## 2017-01-18 DIAGNOSIS — R109 Unspecified abdominal pain: Secondary | ICD-10-CM | POA: Diagnosis not present

## 2017-01-18 MED ORDER — IOPAMIDOL (ISOVUE-300) INJECTION 61%
INTRAVENOUS | Status: AC
  Administered 2017-01-18: 100 mL
  Filled 2017-01-18: qty 100

## 2017-01-18 MED ORDER — HEPARIN SOD (PORK) LOCK FLUSH 100 UNIT/ML IV SOLN
INTRAVENOUS | Status: AC
  Filled 2017-01-18: qty 5

## 2017-01-18 MED ORDER — HEPARIN SOD (PORK) LOCK FLUSH 100 UNIT/ML IV SOLN
500.0000 [IU] | Freq: Once | INTRAVENOUS | Status: AC
  Administered 2017-01-18: 500 [IU] via INTRAVENOUS

## 2017-01-24 ENCOUNTER — Ambulatory Visit: Payer: Medicare Other | Admitting: *Deleted

## 2017-01-24 DIAGNOSIS — G8220 Paraplegia, unspecified: Secondary | ICD-10-CM

## 2017-01-24 DIAGNOSIS — R269 Unspecified abnormalities of gait and mobility: Secondary | ICD-10-CM | POA: Diagnosis not present

## 2017-01-24 DIAGNOSIS — Z7409 Other reduced mobility: Secondary | ICD-10-CM

## 2017-01-24 DIAGNOSIS — M6281 Muscle weakness (generalized): Secondary | ICD-10-CM

## 2017-01-24 DIAGNOSIS — M6289 Other specified disorders of muscle: Secondary | ICD-10-CM

## 2017-01-24 DIAGNOSIS — R6889 Other general symptoms and signs: Secondary | ICD-10-CM

## 2017-01-24 DIAGNOSIS — H8111 Benign paroxysmal vertigo, right ear: Secondary | ICD-10-CM

## 2017-01-24 DIAGNOSIS — M546 Pain in thoracic spine: Secondary | ICD-10-CM | POA: Diagnosis not present

## 2017-01-24 DIAGNOSIS — R52 Pain, unspecified: Secondary | ICD-10-CM

## 2017-01-24 DIAGNOSIS — R29810 Facial weakness: Secondary | ICD-10-CM

## 2017-01-24 DIAGNOSIS — R2689 Other abnormalities of gait and mobility: Secondary | ICD-10-CM

## 2017-01-24 DIAGNOSIS — R2681 Unsteadiness on feet: Secondary | ICD-10-CM

## 2017-01-24 DIAGNOSIS — M5414 Radiculopathy, thoracic region: Secondary | ICD-10-CM | POA: Diagnosis not present

## 2017-01-24 DIAGNOSIS — R293 Abnormal posture: Secondary | ICD-10-CM

## 2017-01-24 DIAGNOSIS — R29898 Other symptoms and signs involving the musculoskeletal system: Secondary | ICD-10-CM

## 2017-01-24 DIAGNOSIS — S24151A Other incomplete lesion at T1 level of thoracic spinal cord, initial encounter: Secondary | ICD-10-CM

## 2017-01-24 DIAGNOSIS — R531 Weakness: Secondary | ICD-10-CM

## 2017-01-24 NOTE — Therapy (Signed)
[facility] [place], [place], [address] Phone: [phone]   Fax:  [phone]  Physical Therapy Treatment  Patient Details  Name: Heather [name] MRN: [id number] Date of Birth: Aug 05, 1945 Referring Provider: Consuella Lose MD  Encounter Date: 01/24/2017      PT End of Session - 01/24/17 1414    Visit Number 5   Number of Visits 16   Date for PT Re-Evaluation 02/20/17   PT Start Time 0945   PT Stop Time 1033   PT Time Calculation (min) 48 min      Past Medical History:  Diagnosis Date  . Anemia    hx of  . Anxiety    r/t updated surgery  . Breast cancer ([place])    right  . Cataract    immature-not sure of which eye  . Complication of anesthesia    pt states she is sensitive to meds  . Dizziness    has been going on for 81month;medical MD aware  . Genetic testing 07/11/2016   Test Results: Normal; No pathogenic mutations detected  Genes Analyzed: 43 genes on [employer]'s Common Cancers panel (APC, ATM, AXIN2, BARD1, BMPR1A, BRCA1, BRCA2, BRIP1, CDH1, CDKN2A, CHEK2, DICER1, EPCAM, GREM1, HOXB13, KIT, MEN1, MLH1, MSH2, MSH6, MUTYH, NBN, NF1, PALB2, PDGFRA, PMS2, POLD1, POLE, PTEN, RAD50, RAD51C, RAD51D, SDHA, SDHB, SDHC, SDHD, SMAD4, SMARCA4, STK11, TP53, TSC1, TSC2, VHL).  .Marland KitchenGERD (gastroesophageal reflux disease)    Tums prn  . H. pylori infection   . H/O hiatal hernia   . Hemorrhoids   . History of UTI   . Hx of radiation therapy 10/25/12- 12/11/12   right chest wall/regional lymph nodes 5040 cGy, 28 sessions, right chest wall boost 1000 cGy 1 session  . Hx: UTI (urinary tract infection)   . Hyperlipidemia    but not on meds;diet and exercise controlled  . Hypertension    recently started Aldactone   . Insomnia    takes Melatoniin daily  . Metastasis to spinal column (HCC)    T5, Left  Femur cancer- radiation   . Neuropathy    "from back surgery"  . PONV (postoperative nausea and vomiting)    pt experienced  hair loss, confusion and combative- 02/15/15  . Right knee pain   . Right shoulder pain   . Skin cancer    squamous, Nodule to back - "same cancer as breast."  . Urinary frequency    d/t taking Aldactone    Past Surgical History:  Procedure Laterality Date  . COLONOSCOPY    . cyst removed from left breast  1970  . DECOMPRESSIVE LUMBAR LAMINECTOMY LEVEL 4 N/A 02/15/2015   Procedure: DECOMPRESSION T5 AND T3-T7 STABALIZATION;  Surgeon: NConsuella Lose MD;  [facility];  Service: Neurosurgery;  Laterality: N/A;  . ESOPHAGOGASTRODUODENOSCOPY    . LAMINECTOMY N/A 03/27/2015   Procedure: Thoracic Four-Thoracic Six Laminectomy for tumor;  Surgeon: NConsuella Lose MD;  [facility];  Service: Neurosurgery;  Laterality: N/A;  T4-T6 Laminectomy  . left wrist surgery   2004   with plate  . MASTECTOMY MODIFIED RADICAL  05/10/2012   Procedure: MASTECTOMY MODIFIED RADICAL;  Surgeon: PMerrie Roof MD;  [facility]  Service: General;  Laterality: Right;  RIGHT MODIFIED RADICAL MASTECTOMY  . MASTECTOMY, RADICAL Right   . MINOR BREAST BIOPSY Left 05/16/2016   Procedure: EXCISION OF BACK NODULE;  Surgeon: PAutumn MessingIII, MD;  [facility]  Service:  General;  Laterality: Left;  . Nodule  back  05/17/2015  . PORTACATH PLACEMENT  05/10/2012   Procedure: INSERTION PORT-A-CATH;  Surgeon: Merrie Roof, MD;  Location: [place];  Service: General;  Laterality: Left;    There were no vitals filed for this visit.      Subjective Assessment - 01/24/17 0958    Subjective Feeling ok today.   Pertinent History Cancer (still active).  Neuropathy.     Patient Stated Goals Get stronger.   Currently in Pain? Yes   Pain Score 2    Pain Location Back   Pain Orientation Right;Left   Pain Descriptors / Indicators Sore   Pain Type Chronic pain   Pain Onset More than a month ago   Pain Frequency Constant                         [facility] Adult PT  Treatment/Exercise - 01/24/17 0001      Exercises   Exercises Knee/Hip     Knee/Hip Exercises: Aerobic   Nustep Level 5 x 15 minutes.     Knee/Hip Exercises: Machines for Strengthening   Cybex Knee Extension 20# x 3 minutes.   Cybex Knee Flexion 30# x 3 minutes.     Knee/Hip Exercises: Standing   Forward Step Up Both;3 sets;10 reps;Step Height: 4"  focus on quad control   Other Standing Knee Exercises TKE XTS pink band x 20 each focus on control     Knee/Hip Exercises: Seated   Knee/Hip Flexion --             Balance Exercises - 01/24/17 1421      Balance Exercises: Standing   Rockerboard Head turns;Intermittent UE support  x 59mns             PT Short Term Goals - 12/22/16 1808      PT SHORT TERM GOAL #1   Title STG's=LTG's.           PT Long Term Goals - 12/22/16 1810      PT LONG TERM GOAL #1   Title Independent with a HEP.   Time 8   Period Weeks   Status New     PT LONG TERM GOAL #2   Title Increase thoracic strength to a solid 5/5.   Time 8   Period Weeks   Status New     PT LONG TERM GOAL #3   Title Negative Romberg test.   Time 8   Period Weeks   Status New     PT LONG TERM GOAL #4   Title Improve Berg score to 49-50/56.   Time 8   Period Weeks   Status New               Plan - 01/24/17 1002    Clinical Impression Statement Pt arrived today doing fairly well. .Marland KitchenRx focused on quad control and balance. Pt did well with TKEs with verbal/tactile cues. Mainly fatigued end of Rx.   Clinical Presentation Evolving   Clinical Decision Making Moderate   PT Frequency 2x / week   PT Duration 8 weeks   PT Treatment/Interventions ADLs/Self Care Home Management;Gait training;Functional mobility training;Therapeutic activities;Therapeutic exercise;Balance training;Neuromuscular re-education   PT Next Visit Plan Standing TKE's.   Consulted and Agree with Plan of Care Patient      Patient will benefit from skilled therapeutic  intervention in order to improve the following deficits and impairments:  Abnormal gait, Decreased activity tolerance, Decreased mobility, Decreased strength, Impaired sensation, Pain, Postural dysfunction  Visit Diagnosis: Pain in thoracic spine  Unsteadiness on feet  Muscle weakness (generalized)  Abnormality of gait  Facial weakness  Abnormal posture  Other abnormalities of gait and mobility  BPPV (benign paroxysmal positional vertigo), right  Paraplegia at T4 level (HCC)  Decreased strength, endurance, and mobility  Weakness of both lower extremities  Postural fatigue  Pain  Incomplete spinal cord lesion at T1-T6 level without bone injury, initial encounter [facility])     Problem List Patient Active Problem List   Diagnosis Date Noted  . Brain metastasis ([place]) 11/18/2016  . Genetic testing 07/11/2016  . ALLERGY TO CHLORHEXADINE, USE BETADINE ONLY 06/23/2016  . Kyphosis of thoracic region 06/13/2016  . Plantar fasciitis, bilateral 04/20/2016  . Neuropathic pain 01/27/2016  . Anemia of chronic disease 05/21/2015  . Metastatic cancer to spine ([place]) 03/27/2015  . Thrombocytopenia ([place]) 03/12/2015  . Prerenal azotemia 03/12/2015  . Pain   . Recurrent UTI--due to Klebsiella   . Paraplegia at T4 level ([place]) 02/20/2015  . Neurogenic bowel 02/20/2015  . Neurogenic bladder 02/20/2015  . Postoperative anemia due to acute blood loss 02/17/2015  . Spinal cord compression due to malignant neoplasm metastatic to spine ([place]) 02/15/2015  . Epidural mass 02/15/2015  . Thoracic spine tumor   . Pathologic fracture of vertebra 02/14/2015  . Pathologic fracture of thoracic vertebrae 02/14/2015  . Breast cancer metastasized to bone, right ([place]) 02/14/2015  . Hyperlipidemia 02/14/2015  . H. pylori infection   . HTN (hypertension) 04/17/2012  . Primary cancer of upper outer quadrant of right female breast ([place]) 03/08/2012    Mohab Ashby,CHRIS, PTA 01/24/2017, 2:26 PM  [facility] [place], [place], [address] Phone: [phone]   Fax:  [phone]  Name: Heather [name] MRN: [id number] Date of Birth: 1945-11-04

## 2017-01-31 ENCOUNTER — Encounter: Payer: Self-pay | Admitting: Physical Therapy

## 2017-01-31 ENCOUNTER — Ambulatory Visit: Payer: Medicare Other | Admitting: Physical Therapy

## 2017-01-31 DIAGNOSIS — M6281 Muscle weakness (generalized): Secondary | ICD-10-CM

## 2017-01-31 DIAGNOSIS — R2681 Unsteadiness on feet: Secondary | ICD-10-CM | POA: Diagnosis not present

## 2017-01-31 DIAGNOSIS — R29810 Facial weakness: Secondary | ICD-10-CM | POA: Diagnosis not present

## 2017-01-31 DIAGNOSIS — M546 Pain in thoracic spine: Secondary | ICD-10-CM | POA: Diagnosis not present

## 2017-01-31 DIAGNOSIS — R269 Unspecified abnormalities of gait and mobility: Secondary | ICD-10-CM

## 2017-01-31 DIAGNOSIS — R293 Abnormal posture: Secondary | ICD-10-CM | POA: Diagnosis not present

## 2017-01-31 DIAGNOSIS — M5414 Radiculopathy, thoracic region: Secondary | ICD-10-CM | POA: Diagnosis not present

## 2017-01-31 NOTE — Therapy (Signed)
[facility] [place], [place], [address] Phone: [phone]   Fax:  [phone]  Physical Therapy Treatment  Patient Details  Name: Heather [name] MRN: [id number] Date of Birth: 23-Feb-1946 Referring Provider: Consuella Lose MD  Encounter Date: 01/31/2017      PT End of Session - 01/31/17 1303    Visit Number 6   Number of Visits 16   Date for PT Re-Evaluation 02/20/17   PT Start Time 0945   PT Stop Time 1030   PT Time Calculation (min) 45 min   Activity Tolerance Patient tolerated treatment well;Patient limited by fatigue   Behavior During Therapy [facility] for tasks assessed/performed      Past Medical History:  Diagnosis Date  . Anemia    hx of  . Anxiety    r/t updated surgery  . Breast cancer ([place])    right  . Cataract    immature-not sure of which eye  . Complication of anesthesia    pt states she is sensitive to meds  . Dizziness    has been going on for 71month;medical MD aware  . Genetic testing 07/11/2016   Test Results: Normal; No pathogenic mutations detected  Genes Analyzed: 43 genes on [employer]'s Common Cancers panel (APC, ATM, AXIN2, BARD1, BMPR1A, BRCA1, BRCA2, BRIP1, CDH1, CDKN2A, CHEK2, DICER1, EPCAM, GREM1, HOXB13, KIT, MEN1, MLH1, MSH2, MSH6, MUTYH, NBN, NF1, PALB2, PDGFRA, PMS2, POLD1, POLE, PTEN, RAD50, RAD51C, RAD51D, SDHA, SDHB, SDHC, SDHD, SMAD4, SMARCA4, STK11, TP53, TSC1, TSC2, VHL).  .Marland KitchenGERD (gastroesophageal reflux disease)    Tums prn  . H. pylori infection   . H/O hiatal hernia   . Hemorrhoids   . History of UTI   . Hx of radiation therapy 10/25/12- 12/11/12   right chest wall/regional lymph nodes 5040 cGy, 28 sessions, right chest wall boost 1000 cGy 1 session  . Hx: UTI (urinary tract infection)   . Hyperlipidemia    but not on meds;diet and exercise controlled  . Hypertension    recently started Aldactone   . Insomnia    takes Melatoniin daily  . Metastasis to spinal column (HCC)     T5, Left  Femur cancer- radiation   . Neuropathy    "from back surgery"  . PONV (postoperative nausea and vomiting)    pt experienced hair loss, confusion and combative- 02/15/15  . Right knee pain   . Right shoulder pain   . Skin cancer    squamous, Nodule to back - "same cancer as breast."  . Urinary frequency    d/t taking Aldactone    Past Surgical History:  Procedure Laterality Date  . COLONOSCOPY    . cyst removed from left breast  1970  . DECOMPRESSIVE LUMBAR LAMINECTOMY LEVEL 4 N/A 02/15/2015   Procedure: DECOMPRESSION T5 AND T3-T7 STABALIZATION;  Surgeon: NConsuella Lose MD;  [facility];  Service: Neurosurgery;  Laterality: N/A;  . ESOPHAGOGASTRODUODENOSCOPY    . LAMINECTOMY N/A 03/27/2015   Procedure: Thoracic Four-Thoracic Six Laminectomy for tumor;  Surgeon: NConsuella Lose MD;  [facility];  Service: Neurosurgery;  Laterality: N/A;  T4-T6 Laminectomy  . left wrist surgery   2004   with plate  . MASTECTOMY MODIFIED RADICAL  05/10/2012   Procedure: MASTECTOMY MODIFIED RADICAL;  Surgeon: PMerrie Roof MD;  [facility]  Service: General;  Laterality: Right;  RIGHT MODIFIED RADICAL MASTECTOMY  . MASTECTOMY, RADICAL Right   . MINOR BREAST BIOPSY Left 05/16/2016  Procedure: EXCISION OF BACK NODULE;  Surgeon: Autumn Messing III, MD;  Location: [place];  Service: General;  Laterality: Left;  . Nodule  back  05/17/2015  . PORTACATH PLACEMENT  05/10/2012   Procedure: INSERTION PORT-A-CATH;  Surgeon: Merrie Roof, MD;  Location: [place];  Service: General;  Laterality: Left;    There were no vitals filed for this visit.      Subjective Assessment - 01/31/17 1300    Subjective No new complaints.   Pertinent History Cancer (still active).  Neuropathy.     Patient Stated Goals Get stronger.   Pain Score 2    Pain Location Back   Pain Orientation Right;Left   Pain Descriptors / Indicators Sore   Pain Type Chronic pain   Pain  Onset More than a month ago                         [facility] Adult PT Treatment/Exercise - 01/31/17 0001      Exercises   Exercises Knee/Hip     Knee/Hip Exercises: Aerobic   Nustep level 5 x 18 minutes.     Knee/Hip Exercises: Machines for Strengthening   Cybex Knee Flexion 40# x 6 minutes.     Knee/Hip Exercises: Seated   Stool Scoot - Round Trips --  2 minutes.             Balance Exercises - 01/31/17 1301      Balance Exercises: Standing   Rockerboard --  6 minutes and inverted BOSU ball x 6 minutes (12 mins total)             PT Short Term Goals - 12/22/16 1808      PT SHORT TERM GOAL #1   Title STG's=LTG's.           PT Long Term Goals - 12/22/16 1810      PT LONG TERM GOAL #1   Title Independent with a HEP.   Time 8   Period Weeks   Status New     PT LONG TERM GOAL #2   Title Increase thoracic strength to a solid 5/5.   Time 8   Period Weeks   Status New     PT LONG TERM GOAL #3   Title Negative Romberg test.   Time 8   Period Weeks   Status New     PT LONG TERM GOAL #4   Title Improve Berg score to 49-50/56.   Time 8   Period Weeks   Status New               Plan - 01/31/17 1303    Clinical Impression Statement Excellent job today.  Definite improvement with regards to neuro and hamstring control.   PT Treatment/Interventions ADLs/Self Care Home Management;Gait training;Functional mobility training;Therapeutic activities;Therapeutic exercise;Balance training;Neuromuscular re-education      Patient will benefit from skilled therapeutic intervention in order to improve the following deficits and impairments:  Abnormal gait, Decreased activity tolerance, Decreased mobility, Decreased strength, Impaired sensation, Pain, Postural dysfunction  Visit Diagnosis: Pain in thoracic spine  Unsteadiness on feet  Muscle weakness (generalized)  Abnormality of gait     Problem List Patient Active Problem  List   Diagnosis Date Noted  . Brain metastasis ([place]) 11/18/2016  . Genetic testing 07/11/2016  . ALLERGY TO CHLORHEXADINE, USE BETADINE ONLY 06/23/2016  . Kyphosis of thoracic region 06/13/2016  . Plantar fasciitis, bilateral 04/20/2016  .  Neuropathic pain 01/27/2016  . Anemia of chronic disease 05/21/2015  . Metastatic cancer to spine ([place]) 03/27/2015  . Thrombocytopenia ([place]) 03/12/2015  . Prerenal azotemia 03/12/2015  . Pain   . Recurrent UTI--due to Klebsiella   . Paraplegia at T4 level ([place]) 02/20/2015  . Neurogenic bowel 02/20/2015  . Neurogenic bladder 02/20/2015  . Postoperative anemia due to acute blood loss 02/17/2015  . Spinal cord compression due to malignant neoplasm metastatic to spine ([place]) 02/15/2015  . Epidural mass 02/15/2015  . Thoracic spine tumor   . Pathologic fracture of vertebra 02/14/2015  . Pathologic fracture of thoracic vertebrae 02/14/2015  . Breast cancer metastasized to bone, right ([place]) 02/14/2015  . Hyperlipidemia 02/14/2015  . H. pylori infection   . HTN (hypertension) 04/17/2012  . Primary cancer of upper outer quadrant of right female breast ([place]) 03/08/2012    Jacoby Zanni, [place] MPT 01/31/2017, 1:05 PM  [facility] [place], [place], [address] Phone: [phone]   Fax:  [phone]  Name: Heather [name] MRN: [id number] Date of Birth: [birth year]

## 2017-02-02 ENCOUNTER — Other Ambulatory Visit: Payer: Self-pay

## 2017-02-02 ENCOUNTER — Ambulatory Visit: Payer: Medicare Other

## 2017-02-02 ENCOUNTER — Other Ambulatory Visit: Payer: Self-pay | Admitting: Adult Health

## 2017-02-02 ENCOUNTER — Other Ambulatory Visit (HOSPITAL_BASED_OUTPATIENT_CLINIC_OR_DEPARTMENT_OTHER): Payer: Medicare Other

## 2017-02-02 ENCOUNTER — Ambulatory Visit (HOSPITAL_BASED_OUTPATIENT_CLINIC_OR_DEPARTMENT_OTHER): Payer: Medicare Other

## 2017-02-02 ENCOUNTER — Other Ambulatory Visit: Payer: Self-pay | Admitting: Hematology and Oncology

## 2017-02-02 VITALS — BP 145/67 | HR 69 | Temp 98.4°F | Resp 16

## 2017-02-02 DIAGNOSIS — C7951 Secondary malignant neoplasm of bone: Secondary | ICD-10-CM | POA: Diagnosis not present

## 2017-02-02 DIAGNOSIS — Z5111 Encounter for antineoplastic chemotherapy: Secondary | ICD-10-CM

## 2017-02-02 DIAGNOSIS — C50411 Malignant neoplasm of upper-outer quadrant of right female breast: Secondary | ICD-10-CM

## 2017-02-02 DIAGNOSIS — Z23 Encounter for immunization: Secondary | ICD-10-CM | POA: Diagnosis not present

## 2017-02-02 DIAGNOSIS — Z5112 Encounter for antineoplastic immunotherapy: Secondary | ICD-10-CM | POA: Diagnosis not present

## 2017-02-02 DIAGNOSIS — C50911 Malignant neoplasm of unspecified site of right female breast: Secondary | ICD-10-CM

## 2017-02-02 LAB — COMPREHENSIVE METABOLIC PANEL
ALT: 20 U/L (ref 0–55)
AST: 26 U/L (ref 5–34)
Albumin: 4 g/dL (ref 3.5–5.0)
Alkaline Phosphatase: 76 U/L (ref 40–150)
Anion Gap: 7 mEq/L (ref 3–11)
BUN: 13.4 mg/dL (ref 7.0–26.0)
CO2: 28 mEq/L (ref 22–29)
Calcium: 10 mg/dL (ref 8.4–10.4)
Chloride: 104 mEq/L (ref 98–109)
Creatinine: 0.8 mg/dL (ref 0.6–1.1)
EGFR: 72 mL/min/{1.73_m2} — ABNORMAL LOW (ref >90)
Glucose: 80 mg/dl (ref 70–140)
Potassium: 4 mEq/L (ref 3.5–5.1)
Sodium: 139 mEq/L (ref 136–145)
Total Bilirubin: 0.6 mg/dL (ref 0.20–1.20)
Total Protein: 7.4 g/dL (ref 6.4–8.3)

## 2017-02-02 LAB — CBC WITH DIFFERENTIAL/PLATELET
BASO%: 1 % (ref 0.0–2.0)
Basophils Absolute: 0 10*3/uL (ref 0.0–0.1)
EOS%: 2.7 % (ref 0.0–7.0)
Eosinophils Absolute: 0.1 10*3/uL (ref 0.0–0.5)
HCT: 39.1 % (ref 34.8–46.6)
HGB: 13 g/dL (ref 11.6–15.9)
LYMPH%: 15 % (ref 14.0–49.7)
MCH: 27.7 pg (ref 25.1–34.0)
MCHC: 33.2 g/dL (ref 31.5–36.0)
MCV: 83.3 fL (ref 79.5–101.0)
MONO#: 0.4 10*3/uL (ref 0.1–0.9)
MONO%: 8.9 % (ref 0.0–14.0)
NEUT#: 3.3 10*3/uL (ref 1.5–6.5)
NEUT%: 72.4 % (ref 38.4–76.8)
Platelets: 181 10*3/uL (ref 145–400)
RBC: 4.69 10*6/uL (ref 3.70–5.45)
RDW: 15 % — ABNORMAL HIGH (ref 11.2–14.5)
WBC: 4.5 10*3/uL (ref 3.9–10.3)
lymph#: 0.7 10*3/uL — ABNORMAL LOW (ref 0.9–3.3)

## 2017-02-02 MED ORDER — ACETAMINOPHEN 325 MG PO TABS
650.0000 mg | ORAL_TABLET | Freq: Once | ORAL | Status: AC
  Administered 2017-02-02: 650 mg via ORAL

## 2017-02-02 MED ORDER — DIPHENHYDRAMINE HCL 25 MG PO CAPS
ORAL_CAPSULE | ORAL | Status: AC
  Filled 2017-02-02: qty 1

## 2017-02-02 MED ORDER — SODIUM CHLORIDE 0.9 % IV SOLN
420.0000 mg | Freq: Once | INTRAVENOUS | Status: AC
  Administered 2017-02-02: 420 mg via INTRAVENOUS
  Filled 2017-02-02: qty 14

## 2017-02-02 MED ORDER — ACETAMINOPHEN 325 MG PO TABS
ORAL_TABLET | ORAL | Status: AC
  Filled 2017-02-02: qty 2

## 2017-02-02 MED ORDER — ZOLEDRONIC ACID 4 MG/100ML IV SOLN
4.0000 mg | Freq: Once | INTRAVENOUS | Status: AC
  Administered 2017-02-02: 4 mg via INTRAVENOUS
  Filled 2017-02-02: qty 100

## 2017-02-02 MED ORDER — TRASTUZUMAB CHEMO 150 MG IV SOLR
6.0000 mg/kg | Freq: Once | INTRAVENOUS | Status: AC
  Administered 2017-02-02: 378 mg via INTRAVENOUS
  Filled 2017-02-02: qty 18

## 2017-02-02 MED ORDER — SODIUM CHLORIDE 0.9 % IJ SOLN
10.0000 mL | INTRAMUSCULAR | Status: DC | PRN
  Administered 2017-02-02: 10 mL via INTRAVENOUS
  Filled 2017-02-02: qty 10

## 2017-02-02 MED ORDER — FULVESTRANT 250 MG/5ML IM SOLN
500.0000 mg | Freq: Once | INTRAMUSCULAR | Status: AC
  Administered 2017-02-02: 500 mg via INTRAMUSCULAR
  Filled 2017-02-02: qty 10

## 2017-02-02 MED ORDER — DIPHENHYDRAMINE HCL 25 MG PO CAPS
50.0000 mg | ORAL_CAPSULE | Freq: Once | ORAL | Status: AC
  Administered 2017-02-02: 25 mg via ORAL

## 2017-02-02 MED ORDER — HEPARIN SOD (PORK) LOCK FLUSH 100 UNIT/ML IV SOLN
500.0000 [IU] | Freq: Once | INTRAVENOUS | Status: AC | PRN
  Administered 2017-02-02: 500 [IU]
  Filled 2017-02-02: qty 5

## 2017-02-02 MED ORDER — SODIUM CHLORIDE 0.9 % IV SOLN
Freq: Once | INTRAVENOUS | Status: AC
  Administered 2017-02-02: 11:00:00 via INTRAVENOUS

## 2017-02-02 MED ORDER — INFLUENZA VAC SPLIT QUAD 0.5 ML IM SUSY
0.5000 mL | PREFILLED_SYRINGE | Freq: Once | INTRAMUSCULAR | Status: AC
  Administered 2017-02-02: 0.5 mL via INTRAMUSCULAR
  Filled 2017-02-02: qty 0.5

## 2017-02-02 MED ORDER — SODIUM CHLORIDE 0.9% FLUSH
10.0000 mL | INTRAVENOUS | Status: DC | PRN
  Administered 2017-02-02: 10 mL
  Filled 2017-02-02: qty 10

## 2017-02-02 NOTE — Progress Notes (Signed)
Per Charlestine Massed, NP, ok to proceed with [facility] today.

## 2017-02-02 NOTE — Patient Instructions (Addendum)
[facility] Discharge Instructions for Patients Receiving Chemotherapy  Today you received the following chemotherapy agents:  Herceptin and Perjeta.  To help prevent nausea and vomiting after your treatment, we encourage you to take your nausea medication as directed.   If you develop nausea and vomiting that is not controlled by your nausea medication, call the clinic.   BELOW ARE SYMPTOMS THAT SHOULD BE REPORTED IMMEDIATELY:  *FEVER GREATER THAN 100.5 F  *CHILLS WITH OR WITHOUT FEVER  NAUSEA AND VOMITING THAT IS NOT CONTROLLED WITH YOUR NAUSEA MEDICATION  *UNUSUAL SHORTNESS OF BREATH  *UNUSUAL BRUISING OR BLEEDING  TENDERNESS IN MOUTH AND THROAT WITH OR WITHOUT PRESENCE OF ULCERS  *URINARY PROBLEMS  *BOWEL PROBLEMS  UNUSUAL RASH Items with * indicate a potential emergency and should be followed up as soon as possible.  Feel free to call the clinic should you have any questions or concerns. The clinic phone number is [phone].  Please show the [place] at check-in to the Emergency Department and triage nurse.  Zoledronic Acid injection (Hypercalcemia, Oncology) What is this medicine? ZOLEDRONIC ACID (ZOE le dron ik AS id) lowers the amount of calcium loss from bone. It is used to treat too much calcium in your blood from cancer. It is also used to prevent complications of cancer that has spread to the bone. This medicine may be used for other purposes; ask your health care provider or pharmacist if you have questions. COMMON BRAND NAME(S): Zometa What should I tell my health care provider before I take this medicine? They need to know if you have any of these conditions: -aspirin-sensitive asthma -cancer, especially if you are receiving medicines used to treat cancer -dental disease or wear dentures -infection -kidney disease -receiving corticosteroids like dexamethasone or prednisone -an unusual or allergic reaction to zoledronic acid,  other medicines, foods, dyes, or preservatives -pregnant or trying to get pregnant -breast-feeding How should I use this medicine? This medicine is for infusion into a vein. It is given by a health care professional in a hospital or clinic setting. Talk to your pediatrician regarding the use of this medicine in children. Special care may be needed. Overdosage: If you think you have taken too much of this medicine contact a poison control center or emergency room at once. NOTE: This medicine is only for you. Do not share this medicine with others. What if I miss a dose? It is important not to miss your dose. Call your doctor or health care professional if you are unable to keep an appointment. What may interact with this medicine? -certain antibiotics given by injection -NSAIDs, medicines for pain and inflammation, like ibuprofen or naproxen -some diuretics like bumetanide, furosemide -teriparatide -thalidomide This list may not describe all possible interactions. Give your health care provider a list of all the medicines, herbs, non-prescription drugs, or dietary supplements you use. Also tell them if you smoke, drink alcohol, or use illegal drugs. Some items may interact with your medicine. What should I watch for while using this medicine? Visit your doctor or health care professional for regular checkups. It may be some time before you see the benefit from this medicine. Do not stop taking your medicine unless your doctor tells you to. Your doctor may order blood tests or other tests to see how you are doing. Women should inform their doctor if they wish to become pregnant or think they might be pregnant. There is a potential for serious side effects to an unborn  child. Talk to your health care professional or pharmacist for more information. You should make sure that you get enough calcium and vitamin D while you are taking this medicine. Discuss the foods you eat and the vitamins you take  with your health care professional. Some people who take this medicine have severe bone, joint, and/or muscle pain. This medicine may also increase your risk for jaw problems or a broken thigh bone. Tell your doctor right away if you have severe pain in your jaw, bones, joints, or muscles. Tell your doctor if you have any pain that does not go away or that gets worse. Tell your dentist and dental surgeon that you are taking this medicine. You should not have major dental surgery while on this medicine. See your dentist to have a dental exam and fix any dental problems before starting this medicine. Take good care of your teeth while on this medicine. Make sure you see your dentist for regular follow-up appointments. What side effects may I notice from receiving this medicine? Side effects that you should report to your doctor or health care professional as soon as possible: -allergic reactions like skin rash, itching or hives, swelling of the face, lips, or tongue -anxiety, confusion, or depression -breathing problems -changes in vision -eye pain -feeling faint or lightheaded, falls -jaw pain, especially after dental work -mouth sores -muscle cramps, stiffness, or weakness -redness, blistering, peeling or loosening of the skin, including inside the mouth -trouble passing urine or change in the amount of urine Side effects that usually do not require medical attention (report to your doctor or health care professional if they continue or are bothersome): -bone, joint, or muscle pain -constipation -diarrhea -fever -hair loss -irritation at site where injected -loss of appetite -nausea, vomiting -stomach upset -trouble sleeping -trouble swallowing -weak or tired This list may not describe all possible side effects. Call your doctor for medical advice about side effects. You may report side effects to FDA at 1-800-FDA-1088. Where should I keep my medicine? This drug is given in a hospital  or clinic and will not be stored at home. NOTE: This sheet is a summary. It may not cover all possible information. If you have questions about this medicine, talk to your doctor, pharmacist, or health care provider.  2018 Elsevier/Gold Standard (2013-09-21 14:19:39)  Fulvestrant injection What is this medicine? FULVESTRANT (ful VES trant) blocks the effects of estrogen. It is used to treat breast cancer. This medicine may be used for other purposes; ask your health care provider or pharmacist if you have questions. COMMON BRAND NAME(S): FASLODEX What should I tell my health care provider before I take this medicine? They need to know if you have any of these conditions: -bleeding problems -liver disease -low levels of platelets in the blood -an unusual or allergic reaction to fulvestrant, other medicines, foods, dyes, or preservatives -pregnant or trying to get pregnant -breast-feeding How should I use this medicine? This medicine is for injection into a muscle. It is usually given by a health care professional in a hospital or clinic setting. Talk to your pediatrician regarding the use of this medicine in children. Special care may be needed. Overdosage: If you think you have taken too much of this medicine contact a poison control center or emergency room at once. NOTE: This medicine is only for you. Do not share this medicine with others. What if I miss a dose? It is important not to miss your dose. Call your doctor or health  care professional if you are unable to keep an appointment. What may interact with this medicine? -medicines that treat or prevent blood clots like warfarin, enoxaparin, and dalteparin This list may not describe all possible interactions. Give your health care provider a list of all the medicines, herbs, non-prescription drugs, or dietary supplements you use. Also tell them if you smoke, drink alcohol, or use illegal drugs. Some items may interact with your  medicine. What should I watch for while using this medicine? Your condition will be monitored carefully while you are receiving this medicine. You will need important blood work done while you are taking this medicine. Do not become pregnant while taking this medicine or for at least 1 year after stopping it. Women of child-bearing potential will need to have a negative pregnancy test before starting this medicine. Women should inform their doctor if they wish to become pregnant or think they might be pregnant. There is a potential for serious side effects to an unborn child. Men should inform their doctors if they wish to father a child. This medicine may lower sperm counts. Talk to your health care professional or pharmacist for more information. Do not breast-feed an infant while taking this medicine or for 1 year after the last dose. What side effects may I notice from receiving this medicine? Side effects that you should report to your doctor or health care professional as soon as possible: -allergic reactions like skin rash, itching or hives, swelling of the face, lips, or tongue -feeling faint or lightheaded, falls -pain, tingling, numbness, or weakness in the legs -signs and symptoms of infection like fever or chills; cough; flu-like symptoms; sore throat -vaginal bleeding Side effects that usually do not require medical attention (report to your doctor or health care professional if they continue or are bothersome): -aches, pains -constipation -diarrhea -headache -hot flashes -nausea, vomiting -pain at site where injected -stomach pain This list may not describe all possible side effects. Call your doctor for medical advice about side effects. You may report side effects to FDA at 1-800-FDA-1088. Where should I keep my medicine? This drug is given in a hospital or clinic and will not be stored at home. NOTE: This sheet is a summary. It may not cover all possible information. If you  have questions about this medicine, talk to your doctor, pharmacist, or health care provider.  2018 Elsevier/Gold Standard (2014-11-21 11:03:55)  Influenza Virus Vaccine injection What is this medicine? INFLUENZA VIRUS VACCINE (in floo EN zuh VAHY ruhs vak SEEN) helps to reduce the risk of getting influenza also known as the flu. The vaccine only helps protect you against some strains of the flu. This medicine may be used for other purposes; ask your health care provider or pharmacist if you have questions. COMMON BRAND NAME(S): Afluria, Agriflu, Alfuria, FLUAD, Fluarix, Fluarix Quadrivalent, Flublok, Flublok Quadrivalent, FLUCELVAX, Flulaval, Fluvirin, Fluzone, Fluzone High-Dose, Fluzone Intradermal What should I tell my health care provider before I take this medicine? They need to know if you have any of these conditions: -bleeding disorder like hemophilia -fever or infection -Guillain-Barre syndrome or other neurological problems -immune system problems -infection with the human immunodeficiency virus (HIV) or AIDS -low blood platelet counts -multiple sclerosis -an unusual or allergic reaction to influenza virus vaccine, latex, other medicines, foods, dyes, or preservatives. Different brands of vaccines contain different allergens. Some may contain latex or eggs. Talk to your doctor about your allergies to make sure that you get the right vaccine. -pregnant or trying  to get pregnant -breast-feeding How should I use this medicine? This vaccine is for injection into a muscle or under the skin. It is given by a health care professional. A copy of Vaccine Information Statements will be given before each vaccination. Read this sheet carefully each time. The sheet may change frequently. Talk to your healthcare provider to see which vaccines are right for you. Some vaccines should not be used in all age groups. Overdosage: If you think you have taken too much of this medicine contact a poison  control center or emergency room at once. NOTE: This medicine is only for you. Do not share this medicine with others. What if I miss a dose? This does not apply. What may interact with this medicine? -chemotherapy or radiation therapy -medicines that lower your immune system like etanercept, anakinra, infliximab, and adalimumab -medicines that treat or prevent blood clots like warfarin -phenytoin -steroid medicines like prednisone or cortisone -theophylline -vaccines This list may not describe all possible interactions. Give your health care provider a list of all the medicines, herbs, non-prescription drugs, or dietary supplements you use. Also tell them if you smoke, drink alcohol, or use illegal drugs. Some items may interact with your medicine. What should I watch for while using this medicine? Report any side effects that do not go away within 3 days to your doctor or health care professional. Call your health care provider if any unusual symptoms occur within 6 weeks of receiving this vaccine. You may still catch the flu, but the illness is not usually as bad. You cannot get the flu from the vaccine. The vaccine will not protect against colds or other illnesses that may cause fever. The vaccine is needed every year. What side effects may I notice from receiving this medicine? Side effects that you should report to your doctor or health care professional as soon as possible: -allergic reactions like skin rash, itching or hives, swelling of the face, lips, or tongue Side effects that usually do not require medical attention (report to your doctor or health care professional if they continue or are bothersome): -fever -headache -muscle aches and pains -pain, tenderness, redness, or swelling at the injection site -tiredness This list may not describe all possible side effects. Call your doctor for medical advice about side effects. You may report side effects to FDA at  1-800-FDA-1088. Where should I keep my medicine? The vaccine will be given by a health care professional in a clinic, pharmacy, doctor's office, or other health care setting. You will not be given vaccine doses to store at home. NOTE: This sheet is a summary. It may not cover all possible information. If you have questions about this medicine, talk to your doctor, pharmacist, or health care provider.  2018 Elsevier/Gold Standard (2014-11-14 10:07:28)

## 2017-02-03 ENCOUNTER — Telehealth: Payer: Self-pay | Admitting: Hematology and Oncology

## 2017-02-03 NOTE — Telephone Encounter (Signed)
Spoke with patient regarding the appts that were added per 9/27 sch msg.

## 2017-02-07 ENCOUNTER — Ambulatory Visit: Payer: Medicare Other | Attending: Neurosurgery | Admitting: Physical Therapy

## 2017-02-07 ENCOUNTER — Encounter: Payer: Self-pay | Admitting: Physical Therapy

## 2017-02-07 DIAGNOSIS — M546 Pain in thoracic spine: Secondary | ICD-10-CM | POA: Diagnosis not present

## 2017-02-07 DIAGNOSIS — M6281 Muscle weakness (generalized): Secondary | ICD-10-CM | POA: Insufficient documentation

## 2017-02-07 DIAGNOSIS — R2681 Unsteadiness on feet: Secondary | ICD-10-CM

## 2017-02-07 NOTE — Therapy (Signed)
[facility] [place], [place], [address] Phone: [phone]   Fax:  [phone]  Physical Therapy Treatment  Patient Details  Name: Heather [name] MRN: [id number] Date of Birth: 1946-03-15 Referring Provider: Consuella Lose MD  Encounter Date: 02/07/2017      PT End of Session - 02/07/17 1304    Visit Number 7   Number of Visits 16   Date for PT Re-Evaluation 02/20/17   PT Start Time 5852   PT Stop Time 7782  Treatment limited somewhat due to fatigue.   PT Time Calculation (min) 43 min   Activity Tolerance Patient limited by fatigue   Behavior During Therapy [facility] for tasks assessed/performed      Past Medical History:  Diagnosis Date  . Anemia    hx of  . Anxiety    r/t updated surgery  . Breast cancer ([place])    right  . Cataract    immature-not sure of which eye  . Complication of anesthesia    pt states she is sensitive to meds  . Dizziness    has been going on for 71month;medical MD aware  . Genetic testing 07/11/2016   Test Results: Normal; No pathogenic mutations detected  Genes Analyzed: 43 genes on [employer]'s Common Cancers panel (APC, ATM, AXIN2, BARD1, BMPR1A, BRCA1, BRCA2, BRIP1, CDH1, CDKN2A, CHEK2, DICER1, EPCAM, GREM1, HOXB13, KIT, MEN1, MLH1, MSH2, MSH6, MUTYH, NBN, NF1, PALB2, PDGFRA, PMS2, POLD1, POLE, PTEN, RAD50, RAD51C, RAD51D, SDHA, SDHB, SDHC, SDHD, SMAD4, SMARCA4, STK11, TP53, TSC1, TSC2, VHL).  .Marland KitchenGERD (gastroesophageal reflux disease)    Tums prn  . H. pylori infection   . H/O hiatal hernia   . Hemorrhoids   . History of UTI   . Hx of radiation therapy 10/25/12- 12/11/12   right chest wall/regional lymph nodes 5040 cGy, 28 sessions, right chest wall boost 1000 cGy 1 session  . Hx: UTI (urinary tract infection)   . Hyperlipidemia    but not on meds;diet and exercise controlled  . Hypertension    recently started Aldactone   . Insomnia    takes Melatoniin daily  . Metastasis to spinal  column (H[facility])    T5, Left  Femur cancer- radiation   . Neuropathy    "from back surgery"  . PONV (postoperative nausea and vomiting)    pt experienced hair loss, confusion and combative- 02/15/15  . Right knee pain   . Right shoulder pain   . Skin cancer    squamous, Nodule to back - "same cancer as breast."  . Urinary frequency    d/t taking Aldactone    Past Surgical History:  Procedure Laterality Date  . COLONOSCOPY    . cyst removed from left breast  1970  . DECOMPRESSIVE LUMBAR LAMINECTOMY LEVEL 4 N/A 02/15/2015   Procedure: DECOMPRESSION T5 AND T3-T7 STABALIZATION;  Surgeon: NConsuella Lose MD;  [facility];  Service: Neurosurgery;  Laterality: N/A;  . ESOPHAGOGASTRODUODENOSCOPY    . LAMINECTOMY N/A 03/27/2015   Procedure: Thoracic Four-Thoracic Six Laminectomy for tumor;  Surgeon: NConsuella Lose MD;  [facility];  Service: Neurosurgery;  Laterality: N/A;  T4-T6 Laminectomy  . left wrist surgery   2004   with plate  . MASTECTOMY MODIFIED RADICAL  05/10/2012   Procedure: MASTECTOMY MODIFIED RADICAL;  Surgeon: PMerrie Roof MD;  [facility]  Service: General;  Laterality: Right;  RIGHT MODIFIED RADICAL MASTECTOMY  . MASTECTOMY, RADICAL Right   . MINOR BREAST  BIOPSY Left 05/16/2016   Procedure: EXCISION OF BACK NODULE;  Surgeon: Autumn Messing III, MD;  Location: [facility];  Service: General;  Laterality: Left;  . Nodule  back  05/17/2015  . PORTACATH PLACEMENT  05/10/2012   Procedure: INSERTION PORT-A-CATH;  Surgeon: Merrie Roof, MD;  Location: [facility];  Service: General;  Laterality: Left;    There were no vitals filed for this visit.      Subjective Assessment - 02/07/17 1304    Subjective I'm doing pretty good today.  I want better stamina and my knee to work better.   Patient Stated Goals Get stronger.                         [facility] Adult PT Treatment/Exercise - 02/07/17 0001      Exercises   Exercises  Shoulder;Knee/Hip     Knee/Hip Exercises: Aerobic   Nustep Level 6 x 15 minutes.     Knee/Hip Exercises: Machines for Strengthening   Cybex Knee Extension 10# x 3 minutes.   Cybex Knee Flexion 40# x 5 minutes.     Knee/Hip Exercises: Standing   Other Standing Knee Exercises Green XTS scapular retraction; bilateral shoulder extension; rows with elbows by side and forward punches.Marland Kitchenall exercises performed 2 sets to fatigue.             Balance Exercises - 02/07/17 1311      Balance Exercises: Standing   Other Standing Exercises Ball toss to patient on Airex balance mat in parallel balance for balance training.             PT Short Term Goals - 12/22/16 1808      PT SHORT TERM GOAL #1   Title STG's=LTG's.           PT Long Term Goals - 12/22/16 1810      PT LONG TERM GOAL #1   Title Independent with a HEP.   Time 8   Period Weeks   Status New     PT LONG TERM GOAL #2   Title Increase thoracic strength to a solid 5/5.   Time 8   Period Weeks   Status New     PT LONG TERM GOAL #3   Title Negative Romberg test.   Time 8   Period Weeks   Status New     PT LONG TERM GOAL #4   Title Improve Berg score to 49-50/56.   Time 8   Period Weeks   Status New               Plan - 02/07/17 1314    Clinical Impression Statement Patient did well today though she was a bit fatigued today.   PT Treatment/Interventions ADLs/Self Care Home Management;Gait training;Functional mobility training;Therapeutic activities;Therapeutic exercise;Balance training;Neuromuscular re-education      Patient will benefit from skilled therapeutic intervention in order to improve the following deficits and impairments:     Visit Diagnosis: Pain in thoracic spine  Unsteadiness on feet  Muscle weakness (generalized)     Problem List Patient Active Problem List   Diagnosis Date Noted  . Brain metastasis ([place]) 11/18/2016  . Genetic testing 07/11/2016  . ALLERGY TO  CHLORHEXADINE, USE BETADINE ONLY 06/23/2016  . Kyphosis of thoracic region 06/13/2016  . Plantar fasciitis, bilateral 04/20/2016  . Neuropathic pain 01/27/2016  . Anemia of chronic disease 05/21/2015  . Metastatic cancer to spine ([place]) 03/27/2015  . Thrombocytopenia (  [place]) 03/12/2015  . Prerenal azotemia 03/12/2015  . Pain   . Recurrent UTI--due to Klebsiella   . Paraplegia at T4 level ([place]) 02/20/2015  . Neurogenic bowel 02/20/2015  . Neurogenic bladder 02/20/2015  . Postoperative anemia due to acute blood loss 02/17/2015  . Spinal cord compression due to malignant neoplasm metastatic to spine ([place]) 02/15/2015  . Epidural mass 02/15/2015  . Thoracic spine tumor   . Pathologic fracture of vertebra 02/14/2015  . Pathologic fracture of thoracic vertebrae 02/14/2015  . Breast cancer metastasized to bone, right ([place]) 02/14/2015  . Hyperlipidemia 02/14/2015  . H. pylori infection   . HTN (hypertension) 04/17/2012  . Primary cancer of upper outer quadrant of right female breast ([place]) 03/08/2012    Mika Griffitts, [place] MPT 02/07/2017, 1:18 PM  [facility] [place], [place], [address] Phone: [phone]   Fax:  [phone]  Name: Heather [name] MRN: [id number] Date of Birth: 03-20-1946

## 2017-02-10 ENCOUNTER — Ambulatory Visit: Payer: Medicare Other

## 2017-02-14 ENCOUNTER — Encounter: Payer: Self-pay | Admitting: Physical Therapy

## 2017-02-14 ENCOUNTER — Ambulatory Visit: Payer: Medicare Other | Admitting: Physical Therapy

## 2017-02-14 DIAGNOSIS — R2681 Unsteadiness on feet: Secondary | ICD-10-CM

## 2017-02-14 DIAGNOSIS — M546 Pain in thoracic spine: Secondary | ICD-10-CM

## 2017-02-14 DIAGNOSIS — M6281 Muscle weakness (generalized): Secondary | ICD-10-CM

## 2017-02-14 NOTE — Therapy (Signed)
[facility] [place], [place], [address] Phone: [phone]   Fax:  [phone]  Physical Therapy Treatment  Patient Details  Name: Heather [name] MRN: [id number] Date of Birth: 05-10-1945 Referring Provider: Consuella Lose MD  Encounter Date: 02/14/2017      PT End of Session - 02/14/17 0948    Visit Number 8   Number of Visits 16   Date for PT Re-Evaluation 02/20/17   PT Start Time 0948   PT Stop Time 1034   PT Time Calculation (min) 46 min   Activity Tolerance Patient tolerated treatment well   Behavior During Therapy [facility] for tasks assessed/performed      Past Medical History:  Diagnosis Date  . Anemia    hx of  . Anxiety    r/t updated surgery  . Breast cancer ([place])    right  . Cataract    immature-not sure of which eye  . Complication of anesthesia    pt states she is sensitive to meds  . Dizziness    has been going on for 26month;medical MD aware  . Genetic testing 07/11/2016   Test Results: Normal; No pathogenic mutations detected  Genes Analyzed: 43 genes on [employer]'s Common Cancers panel (APC, ATM, AXIN2, BARD1, BMPR1A, BRCA1, BRCA2, BRIP1, CDH1, CDKN2A, CHEK2, DICER1, EPCAM, GREM1, HOXB13, KIT, MEN1, MLH1, MSH2, MSH6, MUTYH, NBN, NF1, PALB2, PDGFRA, PMS2, POLD1, POLE, PTEN, RAD50, RAD51C, RAD51D, SDHA, SDHB, SDHC, SDHD, SMAD4, SMARCA4, STK11, TP53, TSC1, TSC2, VHL).  .Marland KitchenGERD (gastroesophageal reflux disease)    Tums prn  . H. pylori infection   . H/O hiatal hernia   . Hemorrhoids   . History of UTI   . Hx of radiation therapy 10/25/12- 12/11/12   right chest wall/regional lymph nodes 5040 cGy, 28 sessions, right chest wall boost 1000 cGy 1 session  . Hx: UTI (urinary tract infection)   . Hyperlipidemia    but not on meds;diet and exercise controlled  . Hypertension    recently started Aldactone   . Insomnia    takes Melatoniin daily  . Metastasis to spinal column (HCC)    T5, Left  Femur cancer-  radiation   . Neuropathy    "from back surgery"  . PONV (postoperative nausea and vomiting)    pt experienced hair loss, confusion and combative- 02/15/15  . Right knee pain   . Right shoulder pain   . Skin cancer    squamous, Nodule to back - "same cancer as breast."  . Urinary frequency    d/t taking Aldactone    Past Surgical History:  Procedure Laterality Date  . COLONOSCOPY    . cyst removed from left breast  1970  . DECOMPRESSIVE LUMBAR LAMINECTOMY LEVEL 4 N/A 02/15/2015   Procedure: DECOMPRESSION T5 AND T3-T7 STABALIZATION;  Surgeon: NConsuella Lose MD;  [facility] [facility];  Service: Neurosurgery;  Laterality: N/A;  . ESOPHAGOGASTRODUODENOSCOPY    . LAMINECTOMY N/A 03/27/2015   Procedure: Thoracic Four-Thoracic Six Laminectomy for tumor;  Surgeon: NConsuella Lose MD;  [facility];  Service: Neurosurgery;  Laterality: N/A;  T4-T6 Laminectomy  . left wrist surgery   2004   with plate  . MASTECTOMY MODIFIED RADICAL  05/10/2012   Procedure: MASTECTOMY MODIFIED RADICAL;  Surgeon: PMerrie Roof MD;  [place]  Service: General;  Laterality: Right;  RIGHT MODIFIED RADICAL MASTECTOMY  . MASTECTOMY, RADICAL Right   . MINOR BREAST BIOPSY Left 05/16/2016   Procedure: EXCISION  OF BACK NODULE;  Surgeon: Autumn Messing III, MD;  Location: [place];  Service: General;  Laterality: Left;  . Nodule  back  05/17/2015  . PORTACATH PLACEMENT  05/10/2012   Procedure: INSERTION PORT-A-CATH;  Surgeon: Merrie Roof, MD;  Location: [place];  Service: General;  Laterality: Left;    There were no vitals filed for this visit.      Subjective Assessment - 02/14/17 0948    Subjective Reports her knees bothering her but states that she locks her knees for balance. Reports that eyes closed activities are very difficult. Reports that she had surgery in February to correct posture but reports lately she has noticed she feels like she is leaning over again. Reports that  she thinks she is having low blood sugar levels due to shakiness.   Pertinent History Cancer (still active).  Neuropathy.     Patient Stated Goals Get stronger.   Currently in Pain? No/denies            [facility] PT Assessment - 02/14/17 0001      Assessment   Medical Diagnosis Thoracic radiculopathy.     Precautions   Precautions Fall     Restrictions   Weight Bearing Restrictions No                     [facility] Adult PT Treatment/Exercise - 02/14/17 0001      Knee/Hip Exercises: Aerobic   Nustep Level 6 x 15 minutes.     Knee/Hip Exercises: Machines for Strengthening   Cybex Knee Extension 10# x30 reps   Cybex Knee Flexion 30# x30 reps     Knee/Hip Exercises: Standing   Heel Raises Both;2 sets   Heel Raises Limitations B toe raise x20 reps   Hip Flexion AROM;Both;2 sets;10 reps;Knee bent   Hip Flexion Limitations alternating with slow pause for balance   Hip Abduction AROM;Both;2 sets;10 reps;Knee straight     Shoulder Exercises: Seated   Horizontal ABduction Strengthening;Both;15 reps;Theraband   Theraband Level (Shoulder Horizontal ABduction) Level 1 (Yellow)   Horizontal ABduction Limitations with vertical bolster for posture   External Rotation Strengthening;Both;15 reps;Theraband   Theraband Level (Shoulder External Rotation) Level 1 (Yellow)   External Rotation Limitations with vertical bolster     Shoulder Exercises: Stretch   Corner Stretch 5 reps;10 seconds                  PT Short Term Goals - 12/22/16 1808      PT SHORT TERM GOAL #1   Title STG's=LTG's.           PT Long Term Goals - 12/22/16 1810      PT LONG TERM GOAL #1   Title Independent with a HEP.   Time 8   Period Weeks   Status New     PT LONG TERM GOAL #2   Title Increase thoracic strength to a solid 5/5.   Time 8   Period Weeks   Status New     PT LONG TERM GOAL #3   Title Negative Romberg test.   Time 8   Period Weeks   Status New     PT LONG  TERM GOAL #4   Title Improve Berg score to 49-50/56.   Time 8   Period Weeks   Status New               Plan - 02/14/17 1106    Clinical Impression Statement  Patient continues to do well in PT although fatigue presents with strengthening exercises. Patient educated throughout treatment of each exercises importance in strengthening and balance. B knee locking into extension noted with standing exercises for stability in standing. Patient guided through standing hip strengthening with SLS incorporated. Seated postural strengthening and chest stretch also completed as patient has observed posture worsening per her observation. Good standing posture noted with standing exercises at countertop today. Patient provided new HEP for hip strengthening/ balance actvities as well as chest stretch and ankle strengthening. Patient educated to speak with oncologist in regards to dietary questions to be able to personalize her diet for her. Patient also educated to leave HEP and equipment in sight so as to remember HEP more often.   Rehab Potential Good   PT Frequency 2x / week   PT Duration 8 weeks   PT Treatment/Interventions ADLs/Self Care Home Management;Gait training;Functional mobility training;Therapeutic activities;Therapeutic exercise;Balance training;Neuromuscular re-education   PT Next Visit Plan Continue with standing hip strengthening/balance exercises as well as posture strengthening per MPT POC.   PT Home Exercise Plan HEP- standing hip flexion, abduction, chest stretch, heel/toe raise   Consulted and Agree with Plan of Care Patient      Patient will benefit from skilled therapeutic intervention in order to improve the following deficits and impairments:  Abnormal gait, Decreased activity tolerance, Decreased mobility, Decreased strength, Impaired sensation, Pain, Postural dysfunction  Visit Diagnosis: Pain in thoracic spine  Unsteadiness on feet  Muscle weakness  (generalized)     Problem List Patient Active Problem List   Diagnosis Date Noted  . Brain metastasis ([place]) 11/18/2016  . Genetic testing 07/11/2016  . ALLERGY TO CHLORHEXADINE, USE BETADINE ONLY 06/23/2016  . Kyphosis of thoracic region 06/13/2016  . Plantar fasciitis, bilateral 04/20/2016  . Neuropathic pain 01/27/2016  . Anemia of chronic disease 05/21/2015  . Metastatic cancer to spine ([place]) 03/27/2015  . Thrombocytopenia ([place]) 03/12/2015  . Prerenal azotemia 03/12/2015  . Pain   . Recurrent UTI--due to Klebsiella   . Paraplegia at T4 level ([place]) 02/20/2015  . Neurogenic bowel 02/20/2015  . Neurogenic bladder 02/20/2015  . Postoperative anemia due to acute blood loss 02/17/2015  . Spinal cord compression due to malignant neoplasm metastatic to spine ([place]) 02/15/2015  . Epidural mass 02/15/2015  . Thoracic spine tumor   . Pathologic fracture of vertebra 02/14/2015  . Pathologic fracture of thoracic vertebrae 02/14/2015  . Breast cancer metastasized to bone, right ([place]) 02/14/2015  . Hyperlipidemia 02/14/2015  . H. pylori infection   . HTN (hypertension) 04/17/2012  . Primary cancer of upper outer quadrant of right female breast ([place]) 03/08/2012    Wynelle Fanny, PTA 02/14/2017, 11:14 AM  [facility] [place], [place], [address] Phone: [phone]   Fax:  [phone]  Name: Heather [name] MRN: [id number] Date of Birth: 29-Dec-1945

## 2017-02-14 NOTE — Patient Instructions (Addendum)
Flexibility: Corner Stretch    Standing in corner with hands just above shoulder level and feet ____ inches from corner, lean forward until a comfortable stretch is felt across chest. Hold __15__ seconds. Repeat __4__ times per set. Do ____ sets per session. Do _2___ sessions per day.  http://orth.exer.us/342   Copyright  VHI. All rights reserved.  Strengthening: Hip Abduction - Resisted    Stand at countertop and kick one leg out to the side. Complete for both legs.  Complete with standing at countertop and marching with alternating legs.  Repeat _10___ times per set. Do __2__ sets per session. Do _2___ sessions per day.  http://orth.exer.us/634   Copyright  VHI. All rights reserved.  Heel Raise: Bilateral (Standing)    Rise on balls of feet. Repeat __10__ times per set. Do _2___ sets per session. Do __2__ sessions per day.  http://orth.exer.us/38   Copyright  VHI. All rights reserved.  Toe Raise (Standing)    Rock back on heels. Repeat _10___ times per set. Do __2__ sets per session. Do __2__ sessions per day.  http://orth.exer.us/42   Copyright  VHI. All rights reserved.

## 2017-02-21 ENCOUNTER — Ambulatory Visit: Payer: Medicare Other | Admitting: Physical Therapy

## 2017-02-21 DIAGNOSIS — M546 Pain in thoracic spine: Secondary | ICD-10-CM

## 2017-02-21 DIAGNOSIS — R2681 Unsteadiness on feet: Secondary | ICD-10-CM | POA: Diagnosis not present

## 2017-02-21 DIAGNOSIS — M6281 Muscle weakness (generalized): Secondary | ICD-10-CM

## 2017-02-21 NOTE — Therapy (Signed)
[facility] [place], [place], [address] Phone: [phone]   Fax:  [phone]  Physical Therapy Treatment  Patient Details  Name: Heather [name] MRN: [id number] Date of Birth: 01/30/1946 Referring Provider: Consuella Lose MD  Encounter Date: 02/21/2017      PT End of Session - 02/21/17 0953    Visit Number 9   Number of Visits 16   Date for PT Re-Evaluation 02/20/17   PT Start Time 0946   PT Stop Time 1029   PT Time Calculation (min) 43 min   Activity Tolerance Patient tolerated treatment well   Behavior During Therapy [facility] for tasks assessed/performed      Past Medical History:  Diagnosis Date  . Anemia    hx of  . Anxiety    r/t updated surgery  . Breast cancer ([place])    right  . Cataract    immature-not sure of which eye  . Complication of anesthesia    pt states she is sensitive to meds  . Dizziness    has been going on for 37month;medical MD aware  . Genetic testing 07/11/2016   Test Results: Normal; No pathogenic mutations detected  Genes Analyzed: 43 genes on [employer]'s Common Cancers panel (APC, ATM, AXIN2, BARD1, BMPR1A, BRCA1, BRCA2, BRIP1, CDH1, CDKN2A, CHEK2, DICER1, EPCAM, GREM1, HOXB13, KIT, MEN1, MLH1, MSH2, MSH6, MUTYH, NBN, NF1, PALB2, PDGFRA, PMS2, POLD1, POLE, PTEN, RAD50, RAD51C, RAD51D, SDHA, SDHB, SDHC, SDHD, SMAD4, SMARCA4, STK11, TP53, TSC1, TSC2, VHL).  .Marland KitchenGERD (gastroesophageal reflux disease)    Tums prn  . H. pylori infection   . H/O hiatal hernia   . Hemorrhoids   . History of UTI   . Hx of radiation therapy 10/25/12- 12/11/12   right chest wall/regional lymph nodes 5040 cGy, 28 sessions, right chest wall boost 1000 cGy 1 session  . Hx: UTI (urinary tract infection)   . Hyperlipidemia    but not on meds;diet and exercise controlled  . Hypertension    recently started Aldactone   . Insomnia    takes Melatoniin daily  . Metastasis to spinal column (HCC)    T5, Left  Femur cancer-  radiation   . Neuropathy    "from back surgery"  . PONV (postoperative nausea and vomiting)    pt experienced hair loss, confusion and combative- 02/15/15  . Right knee pain   . Right shoulder pain   . Skin cancer    squamous, Nodule to back - "same cancer as breast."  . Urinary frequency    d/t taking Aldactone    Past Surgical History:  Procedure Laterality Date  . COLONOSCOPY    . cyst removed from left breast  1970  . DECOMPRESSIVE LUMBAR LAMINECTOMY LEVEL 4 N/A 02/15/2015   Procedure: DECOMPRESSION T5 AND T3-T7 STABALIZATION;  Surgeon: NConsuella Lose MD;  [place] [facility] [place];  Service: Neurosurgery;  Laterality: N/A;  . ESOPHAGOGASTRODUODENOSCOPY    . LAMINECTOMY N/A 03/27/2015   Procedure: Thoracic Four-Thoracic Six Laminectomy for tumor;  Surgeon: NConsuella Lose MD;  [place] [facility];  Service: Neurosurgery;  Laterality: N/A;  T4-T6 Laminectomy  . left wrist surgery   2004   with plate  . MASTECTOMY MODIFIED RADICAL  05/10/2012   Procedure: MASTECTOMY MODIFIED RADICAL;  Surgeon: PMerrie Roof MD;  [place]  Service: General;  Laterality: Right;  RIGHT MODIFIED RADICAL MASTECTOMY  . MASTECTOMY, RADICAL Right   . MINOR BREAST BIOPSY Left 05/16/2016   Procedure: EXCISION  OF BACK NODULE;  Surgeon: Autumn Messing III, MD;  Location: [place];  Service: General;  Laterality: Left;  . Nodule  back  05/17/2015  . PORTACATH PLACEMENT  05/10/2012   Procedure: INSERTION PORT-A-CATH;  Surgeon: Merrie Roof, MD;  Location: [place];  Service: General;  Laterality: Left;    There were no vitals filed for this visit.      Subjective Assessment - 02/21/17 0950    Subjective Reports that she feels jittery this morning and reports she had a sucker upon transit to treatment. Reports she is experiencing a lot of nerve pain under L breast today and was late in taking her gabapentin. Reports uncomfortable sensation in hips with hip abduction.   Pertinent  History Cancer (still active).  Neuropathy.     Patient Stated Goals Get stronger.   Currently in Pain? Yes   Pain Score 3    Pain Location Breast   Pain Orientation Left   Pain Descriptors / Indicators Sharp;Jabbing   Pain Type Chronic pain   Pain Onset More than a month ago   Multiple Pain Sites Yes   Pain Score 2   Pain Location Back   Pain Orientation Mid;Right;Left   Pain Descriptors / Indicators Discomfort            [facility] PT Assessment - 02/21/17 0001      Assessment   Medical Diagnosis Thoracic radiculopathy.   Next MD Visit 06/2017     Precautions   Precautions Fall     Restrictions   Weight Bearing Restrictions No                     [facility] Adult PT Treatment/Exercise - 02/21/17 0001      Knee/Hip Exercises: Aerobic   Nustep L5 x15 min     Knee/Hip Exercises: Machines for Strengthening   Cybex Knee Extension 10# x30 reps   Cybex Knee Flexion 30# x30 reps     Knee/Hip Exercises: Standing   Heel Raises Both;2 sets;10 reps   Heel Raises Limitations B toe raise x20 reps   Hip Abduction AROM;Both;2 sets;10 reps;Knee straight     Knee/Hip Exercises: Seated   Marching Limitations on green theraball for trunk/core control x15 reps     Shoulder Exercises: Seated   Horizontal ABduction Strengthening;Both;15 reps;Theraband   Theraband Level (Shoulder Horizontal ABduction) Level 1 (Yellow)   Horizontal ABduction Limitations seated on dynadisc for trunk/core/balance training   External Rotation Strengthening;Both;15 reps;Theraband   Theraband Level (Shoulder External Rotation) Level 1 (Yellow)   External Rotation Limitations seated on dynadisc then green theraball for trunk/core/balance training   Other Seated Exercises X to V on green theraball x15 reps   Other Seated Exercises Seated reachouts with 4# ball on green theraball x15 reps                  PT Short Term Goals - 12/22/16 1808      PT SHORT TERM GOAL #1   Title STG's=LTG's.            PT Long Term Goals - 12/22/16 1810      PT LONG TERM GOAL #1   Title Independent with a HEP.   Time 8   Period Weeks   Status New     PT LONG TERM GOAL #2   Title Increase thoracic strength to a solid 5/5.   Time 8   Period Weeks   Status New  PT LONG TERM GOAL #3   Title Negative Romberg test.   Time 8   Period Weeks   Status New     PT LONG TERM GOAL #4   Title Improve Berg score to 49-50/56.   Time 8   Period Weeks   Status New               Plan - 02/21/17 1123    Clinical Impression Statement Patient presented in clinic with reports of nerve pain under her L breast as well as jittery sensation which she was educated to report any changes in her condition to PTA. Patient fatigued with many exercises today throughout treatment. More seated trunk/core/ balance training exercises completed today with patient being progressed to green theraball for more of a challenge. Patient encouraged to continue strengthening exercises as the hip pain with hip abduction was described as being from hip weakness.    Rehab Potential Good   PT Frequency 2x / week   PT Duration 8 weeks   PT Treatment/Interventions ADLs/Self Care Home Management;Gait training;Functional mobility training;Therapeutic activities;Therapeutic exercise;Balance training;Neuromuscular re-education   PT Next Visit Plan Continue with standing hip strengthening/balance exercises as well as posture strengthening per MPT POC.   PT Home Exercise Plan HEP- standing hip flexion, abduction, chest stretch, heel/toe raise   Consulted and Agree with Plan of Care Patient      Patient will benefit from skilled therapeutic intervention in order to improve the following deficits and impairments:  Abnormal gait, Decreased activity tolerance, Decreased mobility, Decreased strength, Impaired sensation, Pain, Postural dysfunction  Visit Diagnosis: Pain in thoracic spine  Unsteadiness on feet  Muscle  weakness (generalized)     Problem List Patient Active Problem List   Diagnosis Date Noted  . Brain metastasis ([place]) 11/18/2016  . Genetic testing 07/11/2016  . ALLERGY TO CHLORHEXADINE, USE BETADINE ONLY 06/23/2016  . Kyphosis of thoracic region 06/13/2016  . Plantar fasciitis, bilateral 04/20/2016  . Neuropathic pain 01/27/2016  . Anemia of chronic disease 05/21/2015  . Metastatic cancer to spine ([place]) 03/27/2015  . Thrombocytopenia ([place]) 03/12/2015  . Prerenal azotemia 03/12/2015  . Pain   . Recurrent UTI--due to Klebsiella   . Paraplegia at T4 level ([place]) 02/20/2015  . Neurogenic bowel 02/20/2015  . Neurogenic bladder 02/20/2015  . Postoperative anemia due to acute blood loss 02/17/2015  . Spinal cord compression due to malignant neoplasm metastatic to spine ([place]) 02/15/2015  . Epidural mass 02/15/2015  . Thoracic spine tumor   . Pathologic fracture of vertebra 02/14/2015  . Pathologic fracture of thoracic vertebrae 02/14/2015  . Breast cancer metastasized to bone, right ([place]) 02/14/2015  . Hyperlipidemia 02/14/2015  . H. pylori infection   . HTN (hypertension) 04/17/2012  . Primary cancer of upper outer quadrant of right female breast ([place]) 03/08/2012    Wynelle Fanny, PTA 02/21/2017, 11:34 AM  [facility] [place], [place], [address] Phone: [phone]   Fax:  [phone]  Name: Heather [name] MRN: [id number] Date of Birth: [birth year]

## 2017-02-27 ENCOUNTER — Other Ambulatory Visit: Payer: Self-pay | Admitting: *Deleted

## 2017-02-27 DIAGNOSIS — C7949 Secondary malignant neoplasm of other parts of nervous system: Secondary | ICD-10-CM

## 2017-02-27 DIAGNOSIS — D4989 Neoplasm of unspecified behavior of other specified sites: Secondary | ICD-10-CM

## 2017-02-27 DIAGNOSIS — C7931 Secondary malignant neoplasm of brain: Secondary | ICD-10-CM

## 2017-02-28 ENCOUNTER — Encounter: Payer: Self-pay | Admitting: Physical Therapy

## 2017-02-28 ENCOUNTER — Ambulatory Visit: Payer: Medicare Other | Admitting: Physical Therapy

## 2017-02-28 DIAGNOSIS — M546 Pain in thoracic spine: Secondary | ICD-10-CM | POA: Diagnosis not present

## 2017-02-28 DIAGNOSIS — M6281 Muscle weakness (generalized): Secondary | ICD-10-CM | POA: Diagnosis not present

## 2017-02-28 DIAGNOSIS — R2681 Unsteadiness on feet: Secondary | ICD-10-CM | POA: Diagnosis not present

## 2017-02-28 NOTE — Therapy (Signed)
[facility] [place], [place], [address] Phone: [phone]   Fax:  [phone]  Physical Therapy Treatment  Patient Details  Name: Heather [name] MRN: [id number] Date of Birth: 20-Jul-1945 Referring Provider: Consuella Lose MD  Encounter Date: 02/28/2017      PT End of Session - 02/28/17 1000    Visit Number 10   Number of Visits 16   Date for PT Re-Evaluation 02/20/17   PT Start Time 0947   PT Stop Time 1029   PT Time Calculation (min) 42 min   Activity Tolerance Patient tolerated treatment well   Behavior During Therapy [facility] for tasks assessed/performed      Past Medical History:  Diagnosis Date  . Anemia    hx of  . Anxiety    r/t updated surgery  . Breast cancer ([place])    right  . Cataract    immature-not sure of which eye  . Complication of anesthesia    pt states she is sensitive to meds  . Dizziness    has been going on for 76month;medical MD aware  . Genetic testing 07/11/2016   Test Results: Normal; No pathogenic mutations detected  Genes Analyzed: 43 genes on [employer]'s Common Cancers panel (APC, ATM, AXIN2, BARD1, BMPR1A, BRCA1, BRCA2, BRIP1, CDH1, CDKN2A, CHEK2, DICER1, EPCAM, GREM1, HOXB13, KIT, MEN1, MLH1, MSH2, MSH6, MUTYH, NBN, NF1, PALB2, PDGFRA, PMS2, POLD1, POLE, PTEN, RAD50, RAD51C, RAD51D, SDHA, SDHB, SDHC, SDHD, SMAD4, SMARCA4, STK11, TP53, TSC1, TSC2, VHL).  .Marland KitchenGERD (gastroesophageal reflux disease)    Tums prn  . H. pylori infection   . H/O hiatal hernia   . Hemorrhoids   . History of UTI   . Hx of radiation therapy 10/25/12- 12/11/12   right chest wall/regional lymph nodes 5040 cGy, 28 sessions, right chest wall boost 1000 cGy 1 session  . Hx: UTI (urinary tract infection)   . Hyperlipidemia    but not on meds;diet and exercise controlled  . Hypertension    recently started Aldactone   . Insomnia    takes Melatoniin daily  . Metastasis to spinal column (HCC)    T5, Left  Femur cancer-  radiation   . Neuropathy    "from back surgery"  . PONV (postoperative nausea and vomiting)    pt experienced hair loss, confusion and combative- 02/15/15  . Right knee pain   . Right shoulder pain   . Skin cancer    squamous, Nodule to back - "same cancer as breast."  . Urinary frequency    d/t taking Aldactone    Past Surgical History:  Procedure Laterality Date  . COLONOSCOPY    . cyst removed from left breast  1970  . DECOMPRESSIVE LUMBAR LAMINECTOMY LEVEL 4 N/A 02/15/2015   Procedure: DECOMPRESSION T5 AND T3-T7 STABALIZATION;  Surgeon: NConsuella Lose MD;  [facility];  Service: Neurosurgery;  Laterality: N/A;  . ESOPHAGOGASTRODUODENOSCOPY    . LAMINECTOMY N/A 03/27/2015   Procedure: Thoracic Four-Thoracic Six Laminectomy for tumor;  Surgeon: NConsuella Lose MD;  [facility];  Service: Neurosurgery;  Laterality: N/A;  T4-T6 Laminectomy  . left wrist surgery   2004   with plate  . MASTECTOMY MODIFIED RADICAL  05/10/2012   Procedure: MASTECTOMY MODIFIED RADICAL;  Surgeon: PMerrie Roof MD;  [facility]  Service: General;  Laterality: Right;  RIGHT MODIFIED RADICAL MASTECTOMY  . MASTECTOMY, RADICAL Right   . MINOR BREAST BIOPSY Left 05/16/2016   Procedure: EXCISION  OF BACK NODULE;  Surgeon: Autumn Messing III, MD;  Location: [facility];  Service: General;  Laterality: Left;  . Nodule  back  05/17/2015  . PORTACATH PLACEMENT  05/10/2012   Procedure: INSERTION PORT-A-CATH;  Surgeon: Merrie Roof, MD;  Location: [place];  Service: General;  Laterality: Left;    There were no vitals filed for this visit.      Subjective Assessment - 02/28/17 0949    Subjective Reports that she still has the jittery sensation but has noticed improvement with balance. To have another MRI on 03/15/2017 of thoracic spine and brain for recheck on tumors.   Pertinent History Cancer (still active).  Neuropathy.     Patient Stated Goals Get stronger.   Currently  in Pain? Yes   Pain Score 2    Pain Location Breast  and top of her back   Pain Orientation Left   Pain Descriptors / Indicators Dull;Aching   Pain Type Chronic pain   Pain Onset More than a month ago            [facility] PT Assessment - 02/28/17 0001      Assessment   Medical Diagnosis Thoracic radiculopathy.   Next MD Visit 06/2017     Precautions   Precautions Fall     Restrictions   Weight Bearing Restrictions No                     [facility] Adult PT Treatment/Exercise - 02/28/17 0001      Knee/Hip Exercises: Aerobic   Nustep L6 x15 min     Knee/Hip Exercises: Machines for Strengthening   Cybex Knee Extension 10# x20 reps   Cybex Knee Flexion 30# x20 reps     Knee/Hip Exercises: Standing   Heel Raises Both;2 sets;10 reps   Heel Raises Limitations B toe raise x20 reps   Hip Flexion AROM;Both;2 sets;10 reps;Knee bent   Hip Flexion Limitations alternating with slow pause for balance on airex  intermittant UE support   Hip Abduction AROM;Both;15 reps;Knee straight  with contralateral LE on single airex for balance   Abduction Limitations intermittant UE support     Shoulder Exercises: Seated   Horizontal ABduction Strengthening;Both;15 reps;Theraband   Theraband Level (Shoulder Horizontal ABduction) Level 2 (Red)   Horizontal ABduction Limitations Seated on blue theraball   External Rotation Strengthening;Both;15 reps;Theraband   Theraband Level (Shoulder External Rotation) Level 2 (Red)   External Rotation Limitations seated on blue theraball   Other Seated Exercises Seated on blue theraball shoulder diagonals BUE x15 reps red theraband   Other Seated Exercises Seated X to V on blue theraball x15 reps             Balance Exercises - 02/28/17 1049      Balance Exercises: Standing   Standing Eyes Opened Narrow base of support (BOS)  x3 min on BOSU DLS   SLS Eyes open;Foam/compliant surface;Intermittent upper extremity support  Cone stacking to  cabinet x2 reps each on single airex             PT Short Term Goals - 12/22/16 1808      PT SHORT TERM GOAL #1   Title STG's=LTG's.           PT Long Term Goals - 02/28/17 1001      PT LONG TERM GOAL #1   Title Independent with a HEP.   Time 8   Period Weeks   Status Achieved  PT LONG TERM GOAL #2   Title Increase thoracic strength to a solid 5/5.   Time 8   Period Weeks   Status On-going     PT LONG TERM GOAL #3   Title Negative Romberg test.   Time 8   Period Weeks   Status On-going     PT LONG TERM GOAL #4   Title Improve Berg score to 49-50/56.   Time 8   Period Weeks   Status On-going               Plan - 02/28/17 1051    Clinical Impression Statement Patient very encouraged today as she is greatful to have made so much progress. Patient did fatigue quickly with exercises today and did experience discomfort upper thoracic and cervical spine region with shoulder diagonals. LE fatigue very present with cone stacking in SLS on single airex. Greater trunk control noted today with seated theraball exercises. Patient expressed interest in aquatic therapy which she may take with her MD about.   Rehab Potential Good   PT Frequency 2x / week   PT Duration 8 weeks   PT Treatment/Interventions ADLs/Self Care Home Management;Gait training;Functional mobility training;Therapeutic activities;Therapeutic exercise;Balance training;Neuromuscular re-education   PT Next Visit Plan Continue with standing hip strengthening/balance exercises as well as posture strengthening per MPT POC.   PT Home Exercise Plan HEP- standing hip flexion, abduction, chest stretch, heel/toe raise   Consulted and Agree with Plan of Care Patient      Patient will benefit from skilled therapeutic intervention in order to improve the following deficits and impairments:  Abnormal gait, Decreased activity tolerance, Decreased mobility, Decreased strength, Impaired sensation, Pain, Postural  dysfunction  Visit Diagnosis: Pain in thoracic spine  Unsteadiness on feet  Muscle weakness (generalized)     Problem List Patient Active Problem List   Diagnosis Date Noted  . Brain metastasis ([place]) 11/18/2016  . Genetic testing 07/11/2016  . ALLERGY TO CHLORHEXADINE, USE BETADINE ONLY 06/23/2016  . Kyphosis of thoracic region 06/13/2016  . Plantar fasciitis, bilateral 04/20/2016  . Neuropathic pain 01/27/2016  . Anemia of chronic disease 05/21/2015  . Metastatic cancer to spine ([place]) 03/27/2015  . Thrombocytopenia ([place]) 03/12/2015  . Prerenal azotemia 03/12/2015  . Pain   . Recurrent UTI--due to Klebsiella   . Paraplegia at T4 level ([place]) 02/20/2015  . Neurogenic bowel 02/20/2015  . Neurogenic bladder 02/20/2015  . Postoperative anemia due to acute blood loss 02/17/2015  . Spinal cord compression due to malignant neoplasm metastatic to spine ([place]) 02/15/2015  . Epidural mass 02/15/2015  . Thoracic spine tumor   . Pathologic fracture of vertebra 02/14/2015  . Pathologic fracture of thoracic vertebrae 02/14/2015  . Breast cancer metastasized to bone, right ([place]) 02/14/2015  . Hyperlipidemia 02/14/2015  . H. pylori infection   . HTN (hypertension) 04/17/2012  . Primary cancer of upper outer quadrant of right female breast ([place]) 03/08/2012    Wynelle Fanny, PTA 02/28/2017, 11:01 AM  [facility] [place], [place], [address] Phone: [phone]   Fax:  [phone]  Name: Heather [name] MRN: [id number] Date of Birth: 10-Jun-1945

## 2017-03-02 ENCOUNTER — Ambulatory Visit: Payer: Medicare Other

## 2017-03-02 ENCOUNTER — Other Ambulatory Visit (HOSPITAL_BASED_OUTPATIENT_CLINIC_OR_DEPARTMENT_OTHER): Payer: Medicare Other

## 2017-03-02 ENCOUNTER — Telehealth: Payer: Self-pay | Admitting: Hematology and Oncology

## 2017-03-02 ENCOUNTER — Other Ambulatory Visit: Payer: Medicare Other

## 2017-03-02 ENCOUNTER — Ambulatory Visit (HOSPITAL_BASED_OUTPATIENT_CLINIC_OR_DEPARTMENT_OTHER): Payer: Medicare Other | Admitting: Hematology and Oncology

## 2017-03-02 ENCOUNTER — Ambulatory Visit: Payer: Medicare Other | Admitting: Hematology and Oncology

## 2017-03-02 ENCOUNTER — Ambulatory Visit (HOSPITAL_BASED_OUTPATIENT_CLINIC_OR_DEPARTMENT_OTHER): Payer: Medicare Other

## 2017-03-02 VITALS — BP 149/64 | HR 66 | Temp 97.4°F | Resp 16

## 2017-03-02 DIAGNOSIS — Z5111 Encounter for antineoplastic chemotherapy: Secondary | ICD-10-CM

## 2017-03-02 DIAGNOSIS — C7951 Secondary malignant neoplasm of bone: Secondary | ICD-10-CM

## 2017-03-02 DIAGNOSIS — C50411 Malignant neoplasm of upper-outer quadrant of right female breast: Secondary | ICD-10-CM

## 2017-03-02 DIAGNOSIS — C50911 Malignant neoplasm of unspecified site of right female breast: Secondary | ICD-10-CM

## 2017-03-02 DIAGNOSIS — C7989 Secondary malignant neoplasm of other specified sites: Secondary | ICD-10-CM | POA: Diagnosis not present

## 2017-03-02 DIAGNOSIS — Z170 Estrogen receptor positive status [ER+]: Secondary | ICD-10-CM | POA: Diagnosis not present

## 2017-03-02 DIAGNOSIS — Z5112 Encounter for antineoplastic immunotherapy: Secondary | ICD-10-CM

## 2017-03-02 LAB — CBC WITH DIFFERENTIAL/PLATELET
BASO%: 0.9 % (ref 0.0–2.0)
Basophils Absolute: 0 10*3/uL (ref 0.0–0.1)
EOS%: 3 % (ref 0.0–7.0)
Eosinophils Absolute: 0.1 10*3/uL (ref 0.0–0.5)
HCT: 37 % (ref 34.8–46.6)
HGB: 12.1 g/dL (ref 11.6–15.9)
LYMPH%: 12.2 % — ABNORMAL LOW (ref 14.0–49.7)
MCH: 27.5 pg (ref 25.1–34.0)
MCHC: 32.7 g/dL (ref 31.5–36.0)
MCV: 83.9 fL (ref 79.5–101.0)
MONO#: 0.3 10*3/uL (ref 0.1–0.9)
MONO%: 8.7 % (ref 0.0–14.0)
NEUT#: 3 10*3/uL (ref 1.5–6.5)
NEUT%: 75.2 % (ref 38.4–76.8)
Platelets: 187 10*3/uL (ref 145–400)
RBC: 4.41 10*6/uL (ref 3.70–5.45)
RDW: 14.4 % (ref 11.2–14.5)
WBC: 4 10*3/uL (ref 3.9–10.3)
lymph#: 0.5 10*3/uL — ABNORMAL LOW (ref 0.9–3.3)

## 2017-03-02 LAB — COMPREHENSIVE METABOLIC PANEL
ALT: 15 U/L (ref 0–55)
AST: 22 U/L (ref 5–34)
Albumin: 3.7 g/dL (ref 3.5–5.0)
Alkaline Phosphatase: 70 U/L (ref 40–150)
Anion Gap: 10 mEq/L (ref 3–11)
BUN: 14.4 mg/dL (ref 7.0–26.0)
CO2: 24 mEq/L (ref 22–29)
Calcium: 9.5 mg/dL (ref 8.4–10.4)
Chloride: 104 mEq/L (ref 98–109)
Creatinine: 0.8 mg/dL (ref 0.6–1.1)
EGFR: >60 mL/min/{1.73_m2} (ref >60)
Glucose: 115 mg/dl (ref 70–140)
Potassium: 3.9 mEq/L (ref 3.5–5.1)
Sodium: 138 mEq/L (ref 136–145)
Total Bilirubin: 0.44 mg/dL (ref 0.20–1.20)
Total Protein: 7.1 g/dL (ref 6.4–8.3)

## 2017-03-02 MED ORDER — DIPHENHYDRAMINE HCL 25 MG PO CAPS
50.0000 mg | ORAL_CAPSULE | Freq: Once | ORAL | Status: AC
  Administered 2017-03-02: 25 mg via ORAL

## 2017-03-02 MED ORDER — ALTEPLASE 2 MG IJ SOLR
2.0000 mg | Freq: Once | INTRAMUSCULAR | Status: DC | PRN
  Filled 2017-03-02: qty 2

## 2017-03-02 MED ORDER — DIPHENHYDRAMINE HCL 25 MG PO CAPS
ORAL_CAPSULE | ORAL | Status: AC
  Filled 2017-03-02: qty 2

## 2017-03-02 MED ORDER — HEPARIN SOD (PORK) LOCK FLUSH 100 UNIT/ML IV SOLN
500.0000 [IU] | Freq: Once | INTRAVENOUS | Status: AC | PRN
  Administered 2017-03-02: 500 [IU] via INTRAVENOUS
  Filled 2017-03-02: qty 5

## 2017-03-02 MED ORDER — SODIUM CHLORIDE 0.9 % IV SOLN
Freq: Once | INTRAVENOUS | Status: AC
Start: 2017-03-02 — End: 2017-03-02
  Administered 2017-03-02: 10:00:00 via INTRAVENOUS

## 2017-03-02 MED ORDER — FULVESTRANT 250 MG/5ML IM SOLN
500.0000 mg | Freq: Once | INTRAMUSCULAR | Status: AC
  Administered 2017-03-02: 500 mg via INTRAMUSCULAR
  Filled 2017-03-02: qty 10

## 2017-03-02 MED ORDER — SODIUM CHLORIDE 0.9% FLUSH
10.0000 mL | INTRAVENOUS | Status: DC | PRN
  Administered 2017-03-02: 10 mL
  Filled 2017-03-02: qty 10

## 2017-03-02 MED ORDER — SODIUM CHLORIDE 0.9 % IV SOLN
420.0000 mg | Freq: Once | INTRAVENOUS | Status: AC
  Administered 2017-03-02: 420 mg via INTRAVENOUS
  Filled 2017-03-02: qty 14

## 2017-03-02 MED ORDER — ACETAMINOPHEN 325 MG PO TABS
ORAL_TABLET | ORAL | Status: AC
  Filled 2017-03-02: qty 2

## 2017-03-02 MED ORDER — TRASTUZUMAB CHEMO 150 MG IV SOLR
6.0000 mg/kg | Freq: Once | INTRAVENOUS | Status: AC
  Administered 2017-03-02: 378 mg via INTRAVENOUS
  Filled 2017-03-02: qty 18

## 2017-03-02 MED ORDER — SODIUM CHLORIDE 0.9 % IJ SOLN
10.0000 mL | INTRAMUSCULAR | Status: DC | PRN
  Administered 2017-03-02: 10 mL via INTRAVENOUS
  Filled 2017-03-02: qty 10

## 2017-03-02 MED ORDER — ACETAMINOPHEN 325 MG PO TABS
650.0000 mg | ORAL_TABLET | Freq: Once | ORAL | Status: AC
  Administered 2017-03-02: 650 mg via ORAL

## 2017-03-02 NOTE — Progress Notes (Signed)
Echos due every 6 months per Dr. Clayborne Dana office note.

## 2017-03-02 NOTE — Patient Instructions (Signed)
[place] Discharge Instructions for Patients Receiving Chemotherapy  Today you received the following chemotherapy agents Herceptin, Perjeta,   To help prevent nausea and vomiting after your treatment, we encourage you to take your nausea medication as directed If you develop nausea and vomiting that is not controlled by your nausea medication, call the clinic.   BELOW ARE SYMPTOMS THAT SHOULD BE REPORTED IMMEDIATELY:  *FEVER GREATER THAN 100.5 F  *CHILLS WITH OR WITHOUT FEVER  NAUSEA AND VOMITING THAT IS NOT CONTROLLED WITH YOUR NAUSEA MEDICATION  *UNUSUAL SHORTNESS OF BREATH  *UNUSUAL BRUISING OR BLEEDING  TENDERNESS IN MOUTH AND THROAT WITH OR WITHOUT PRESENCE OF ULCERS  *URINARY PROBLEMS  *BOWEL PROBLEMS  UNUSUAL RASH Items with * indicate a potential emergency and should be followed up as soon as possible.  Feel free to call the clinic should you have any questions or concerns. The clinic phone number is [phone].  Please show the [place] at check-in to the Emergency Department and triage nurse.

## 2017-03-02 NOTE — Progress Notes (Signed)
Patient Care Team: [name], MD as PCP - General (Family Medicine)  DIAGNOSIS:  Encounter Diagnosis  Name Primary?  . Primary cancer of upper outer quadrant of right female breast ([name])     SUMMARY OF ONCOLOGIC HISTORY:   Primary cancer of upper outer quadrant of right female breast (Heather [name])   03/08/2012 Initial Diagnosis    Right breast invasive ductal carcinoma ER positive PR negative HER-2 positive Ki-67 44%; another breast mass biopsied in the anterior part of the breast which was also positive for malignancy that was HER-2 negative      05/10/2012 Surgery    Right mastectomy and axillary lymph node dissection: Multifocal disease 5 cm, 1.7 cm, 1.6 cm, ER positive PR negative HER-2 positive Ki-67 44%, 3/7 lymph nodes positive      06/14/2012 - 06/12/2013 Chemotherapy    Adjuvant chemotherapy with [place] 6 followed by Herceptin maintenance      10/25/2012 - 12/11/2012 Radiation Therapy    Adjuvant radiation therapy      02/07/2013 -  Anti-estrogen oral therapy    Letrozole 2.5 mg daily      02/14/2015 Imaging    MRI spine: Large destructive T5 lesion with severe pathologic compression fracture and extensive epidural tumor, severe spinal stenosis with moderate cord compression, tumor involvement of T4      03/18/2015 PET scan    Residual enhancing soft tissue adjacent to the spinal cord. No evidence of metastatic disease. Nonspecific uptake the left nipple      03/27/2015 - 03/30/2015 Hospital Admission    T4-6 decompression for spinal cord compression (lower extremity paralysis)      04/10/2015 -  Chemotherapy    Palliative treatment with Herceptin every 3 weeks with letrozole 2.5 mg daily      04/13/2015 - 05/19/2015 Radiation Therapy    Palliative radiation treatment to the spine      09/01/2015 Imaging    CT chest abdomen pelvis: Pathologic fracture with posterior fusion at T5 no other evidence of metastatic disease in the chest abdomen pelvis      12/23/2015 Imaging      CT CAP: new nonspecific 0.6 cm lymph node was stated mediastinum needs follow-up CT, innumerable tiny groundglass pulmonary nodules throughout both lungs unchanged, patchy consolidation from radiation, T5 fracture, no mets      04/15/2016 PET scan    Rt para-spinal mass mildy hypermetabolic SUV 5.9, lytic cortical lesion 4.8 cm lesion left prox femur suv 8.8 favor osseus met disease      04/28/2016 - 05/12/2016 Radiation Therapy    Palliative XRT Left femur      05/24/2016 Procedure    Soft tissue mass biopsy back: Metastatic breast cancer ER 80%, PR 0%, Ki-67 50%, HER-2 positive ratio 2.25      06/09/2016 -  Anti-estrogen oral therapy    Faslodex with Herceptin and Perjeta every 4 weeks      06/13/2016 - 06/15/2016 Hospital Admission    uncomplicated revision of previous thoracic instrumentation.      10/10/2016 PET scan    Interval development of new hypermetabolic 2.5 x 1.1 cm lesion posterior right eighth rib consistent with metastatic disease, stable right paraspinal lesion at T8, clustered soft tissue nodules in the posterior midline back near cervicothoracic junction smaller than before       11/03/2016 - 11/07/2016 Radiation Therapy    Palliative radiation to right eighth rib      11/04/2016 Imaging    Solitary contrast-enhancing lesion in the right  cerebellum 6 x 2 mm concerning for metastatic lesion      11/24/2016 - 11/24/2016 Radiation Therapy    SRS to the brain lesion       CHIEF COMPLIANT: Follow-up of metastatic breast cancer on Faslodex, Herceptin and Perjeta  INTERVAL HISTORY: Heather [name] is a 71 year old with above-mentioned history metastatic breast cancer who is currently on Faslodex along with Herceptin and progenitor.  She is tolerating the treatment fairly well.  She has had episodes of palpitations as well as fatigue.  She is also continues to lose some hair and this is upsetting her.  She does not have any headache blurred vision lightheadedness or  dizziness.  REVIEW OF SYSTEMS:   Constitutional: Denies fevers, chills or abnormal weight loss Eyes: Denies blurriness of vision Ears, nose, mouth, throat, and face: Denies mucositis or sore throat Respiratory: Denies cough, dyspnea or wheezes Cardiovascular: Denies palpitation, chest discomfort Gastrointestinal:  Denies nausea, heartburn or change in bowel habits Skin: Denies abnormal skin rashes Lymphatics: Denies new lymphadenopathy or easy bruising Neurological:Denies numbness, tingling or new weaknesses Behavioral/Psych: Mood is stable, no new changes  Extremities: No lower extremity edema  All other systems were reviewed with the patient and are negative.  I have reviewed the past medical history, past surgical history, social history and family history with the patient and they are unchanged from previous note.  ALLERGIES:  is allergic to other; acyclovir and related; zanaflex [tizanidine hcl]; codeine; keflex [cephalexin]; naprosyn [naproxen]; oxycodone; tramadol; and tussin [guaifenesin].  MEDICATIONS:  Current Outpatient Prescriptions  Medication Sig Dispense Refill  . ALPRAZolam (XANAX) 0.25 MG tablet Take 1 tablet by mouth as needed.    Marland Kitchen b complex vitamins tablet Take 1 tablet by mouth daily.    . Calcium Carbonate Antacid (TUMS PO) Take 2 tablets by mouth 2 (two) times daily as needed (acid reflux).    . Cholecalciferol (VITAMIN D) 2000 UNITS tablet Take 2,000 Units by mouth daily.    . Coenzyme Q10 (COQ10) 100 MG CAPS Take 100 mg by mouth as needed. Days that she takes pravastatin MWF.    Marland Kitchen fluticasone (FLONASE) 50 MCG/ACT nasal spray Place 1 spray into both nostrils daily as needed.    . gabapentin (NEURONTIN) 600 MG tablet Take 1 tablet (600 mg total) by mouth as directed. Take 1 tablet in the AM and afternoon and 1 1/2 tabs at bedtime 315 tablet 2  . HYDROcodone-acetaminophen (NORCO/VICODIN) 5-325 MG tablet     . lidocaine-prilocaine (EMLA) cream Apply to affected  area once (Patient taking differently: Apply 1 application topically every 30 (thirty) days. Apply to port prior to infusions) 30 g 3  . OVER THE COUNTER MEDICATION Place 1 drop into both eyes 2 (two) times daily as needed (dry eyes). Over the counter lubricating eye drops    . pravastatin (PRAVACHOL) 10 MG tablet Take 10 mg by mouth every Monday, Wednesday, and Friday.     . Probiotic Product (PROBIOTIC DAILY PO) Take 1 capsule by mouth daily.     . ranitidine (ZANTAC) 300 MG tablet Take 300 mg by mouth at bedtime. Reported on 06/25/2015    . spironolactone (ALDACTONE) 25 MG tablet Take 25 mg by mouth daily.     No current facility-administered medications for this visit.     PHYSICAL EXAMINATION: ECOG PERFORMANCE STATUS: 1 - Symptomatic but completely ambulatory  Vitals:   03/02/17 0835  BP: 135/61  Pulse: 70  Resp: 18  Temp: 97.6 F (36.4 C)  SpO2:  100%   Filed Weights   03/02/17 0835  Weight: 134 lb 14.4 oz (61.2 kg)    GENERAL:alert, no distress and comfortable SKIN: skin color, texture, turgor are normal, no rashes or significant lesions EYES: normal, Conjunctiva are pink and non-injected, sclera clear OROPHARYNX:no exudate, no erythema and lips, buccal mucosa, and tongue normal  NECK: supple, thyroid normal size, non-tender, without nodularity LYMPH:  no palpable lymphadenopathy in the cervical, axillary or inguinal LUNGS: clear to auscultation and percussion with normal breathing effort HEART: regular rate & rhythm and no murmurs and no lower extremity edema ABDOMEN:abdomen soft, non-tender and normal bowel sounds MUSCULOSKELETAL:no cyanosis of digits and no clubbing  NEURO: alert & oriented x 3 with fluent speech, no focal motor/sensory deficits EXTREMITIES: No lower extremity edema LABORATORY DATA:  I have reviewed the data as listed   Chemistry      Component Value Date/Time   NA 139 02/02/2017 0955   K 4.0 02/02/2017 0955   CL 103 06/14/2016 0413   CL 101  10/25/2012 0946   CO2 28 02/02/2017 0955   BUN 13.4 02/02/2017 0955   CREATININE 0.8 02/02/2017 0955      Component Value Date/Time   CALCIUM 10.0 02/02/2017 0955   ALKPHOS 76 02/02/2017 0955   AST 26 02/02/2017 0955   ALT 20 02/02/2017 0955   BILITOT 0.60 02/02/2017 0955       Lab Results  Component Value Date   WBC 4.0 03/02/2017   HGB 12.1 03/02/2017   HCT 37.0 03/02/2017   MCV 83.9 03/02/2017   PLT 187 03/02/2017   NEUTROABS 3.0 03/02/2017    ASSESSMENT & PLAN:  Primary cancer of upper outer quadrant of right female breast ([place]) Right breast invasive ductal carcinoma ER positive PR negative HER-2 positive Ki-67 44% multifocal disease 3/7 lymph nodes positive T2 N1 M0 stage IIB status post adjuvant chemotherapy with TCH followed by Herceptin maintenance, adjuvant radiation therapy and Letrozole. MRI 02/14/2015: T5 large destructive lesion with pathologic compression fracture and extensive epidural tumor, involvement of T4, excision metastatic carcinoma ER 60%, PR 0%, HER-2 positive ratio 2.71 average copy #7.45 03/27/2015: T4-6 decompression surgery for spinal cord compression PET-CT 12/10/17Rt para-spinal mass mildy hypermetabolic SUV 5.9, lytic cortical lesion 4.8 cm lesion left prox femur suv 8.8 favor osseus met disease   Treatment summary: 1. Resumed anastrozole 05/21/2015, but it has caused significant hair loss so we switched her to exemestane 25 mg once daily 07/23/2015 2. Herceptin every 3 week started 04/10/2015 3. Bone metastases: Zometa every 3 months. 4. Radiation therapy to the spine (Dr. [name]) 04/13/2015 to 05/19/2015 5. Radiation therapy to left femur 04/28/2016 to 05/12/2016  6. SRS to the brain lesion 11/24/2016 -------------------------------------------------------------------------------------------------------------------------- Goal of treatment: Palliation Treatment plan:Faslodex along with Herceptin and Perjeta first dose of Faslodex given  06/09/2016 every 4 weeks Adverse effects: 1. Hair thinning 2. Fatigue 3.  Left arm pain  MRI spine and brain have been scheduled for 03/15/2017 Our plan is to obtain PET/CT scan in March 2019 Return to clinic every 4 weeks for treatments and in 2 months for follow-up with me along with labs and Zometa.   I spent 25 minutes talking to the patient of which more than half was spent in counseling and coordination of care.  No orders of the defined types were placed in this encounter.  The patient has a good understanding of the overall plan. she agrees with it. she will call with any problems that may develop before the  next visit here.   Rulon Eisenmenger, MD 03/02/17

## 2017-03-02 NOTE — Telephone Encounter (Signed)
Gave patient AVS and calendar of upcoming November and December appointments.

## 2017-03-02 NOTE — Progress Notes (Signed)
Patient remained stable throughout 30 min observation period. Patient denies concerns or complaints on exit. Smith Mince, BSN, RN

## 2017-03-02 NOTE — Assessment & Plan Note (Signed)
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 MRI 02/14/2015: T5 large destructive lesion with pathologic compression fracture and extensive epidural tumor, involvement of T4, excision metastatic carcinoma ER 60%, PR 0%, HER-2 positive ratio 2.71 average copy #7.45 03/27/2015: T4-6 decompression surgery for spinal cord compression PET-CT 12/10/17Rt para-spinal mass mildy hypermetabolic SUV 5.9, lytic cortical lesion 4.8 cm lesion left prox femur suv 8.8 favor osseus met disease   Treatment summary: 1. Resumed anastrozole 05/21/2015, but it has caused significant hair loss so we switched her to exemestane 25 mg once daily 07/23/2015 2. Herceptin every 3 week started 04/10/2015 3. Bone metastases: Zometa every 3 months. 4. Radiation therapy to the spine (Dr. [name]) 04/13/2015 to 05/19/2015 5. Radiation therapy to left femur 04/28/2016 to 05/12/2016  6. SRS to the brain lesion 11/24/2016 -------------------------------------------------------------------------------------------------------------------------- Goal of treatment: Palliation Treatment plan:Faslodex along with Herceptin and Perjeta first dose of Faslodex given 06/09/2016 every 4 weeks  MRI spine and brain have been scheduled for 03/15/2017 CT chest abdomen pelvis will be obtained in December 2018 Return to clinic every 4 weeks for treatments and every 8 weeks for follow-up with me

## 2017-03-14 ENCOUNTER — Ambulatory Visit: Payer: Medicare Other | Attending: Neurosurgery | Admitting: Physical Therapy

## 2017-03-14 ENCOUNTER — Encounter: Payer: Self-pay | Admitting: Physical Therapy

## 2017-03-14 DIAGNOSIS — R2681 Unsteadiness on feet: Secondary | ICD-10-CM | POA: Diagnosis not present

## 2017-03-14 DIAGNOSIS — M6281 Muscle weakness (generalized): Secondary | ICD-10-CM | POA: Diagnosis not present

## 2017-03-14 DIAGNOSIS — M546 Pain in thoracic spine: Secondary | ICD-10-CM | POA: Insufficient documentation

## 2017-03-14 NOTE — Therapy (Signed)
[facility] [place], [place], [address] Phone: [phone]   Fax:  [phone]  Physical Therapy Treatment  Patient Details  Name: Heather [name] MRN: [id number] Date of Birth: 1945/09/01 Referring Provider: Consuella Lose MD   Encounter Date: 03/14/2017  PT End of Session - 03/14/17 0949    Visit Number  11    Number of Visits  16    Date for PT Re-Evaluation  02/20/17    PT Start Time  0947    PT Stop Time  1032    PT Time Calculation (min)  45 min    Activity Tolerance  Patient tolerated treatment well    Behavior During Therapy  [facility] for tasks assessed/performed       Past Medical History:  Diagnosis Date  . Anemia    hx of  . Anxiety    r/t updated surgery  . Breast cancer ([place])    right  . Cataract    immature-not sure of which eye  . Complication of anesthesia    pt states she is sensitive to meds  . Dizziness    has been going on for 42month;medical MD aware  . Genetic testing 07/11/2016   Test Results: Normal; No pathogenic mutations detected  Genes Analyzed: 43 genes on [employer]'s Common Cancers panel (APC, ATM, AXIN2, BARD1, BMPR1A, BRCA1, BRCA2, BRIP1, CDH1, CDKN2A, CHEK2, DICER1, EPCAM, GREM1, HOXB13, KIT, MEN1, MLH1, MSH2, MSH6, MUTYH, NBN, NF1, PALB2, PDGFRA, PMS2, POLD1, POLE, PTEN, RAD50, RAD51C, RAD51D, SDHA, SDHB, SDHC, SDHD, SMAD4, SMARCA4, STK11, TP53, TSC1, TSC2, VHL).  .Marland KitchenGERD (gastroesophageal reflux disease)    Tums prn  . H. pylori infection   . H/O hiatal hernia   . Hemorrhoids   . History of UTI   . Hx of radiation therapy 10/25/12- 12/11/12   right chest wall/regional lymph nodes 5040 cGy, 28 sessions, right chest wall boost 1000 cGy 1 session  . Hx: UTI (urinary tract infection)   . Hyperlipidemia    but not on meds;diet and exercise controlled  . Hypertension    recently started Aldactone   . Insomnia    takes Melatoniin daily  . Metastasis to spinal column (HCC)    T5, Left   Femur cancer- radiation   . Neuropathy    "from back surgery"  . PONV (postoperative nausea and vomiting)    pt experienced hair loss, confusion and combative- 02/15/15  . Right knee pain   . Right shoulder pain   . Skin cancer    squamous, Nodule to back - "same cancer as breast."  . Urinary frequency    d/t taking Aldactone    Past Surgical History:  Procedure Laterality Date  . COLONOSCOPY    . cyst removed from left breast  1970  . ESOPHAGOGASTRODUODENOSCOPY    . left wrist surgery   2004   with plate  . MASTECTOMY, RADICAL Right   . Nodule  back  05/17/2015    There were no vitals filed for this visit.  Subjective Assessment - 03/14/17 0948    Subjective  Reports she feels she is doing well and feels she is doing more. Reports he gets very sore in chest area and LEs but has been having muscle spasms. Reports having cramps intermittantly but present after sneezing or coughing. Reports a fall on 03/06/2017 at home in bedroom as she tripped coming out of closet and her foot got hung on piece of luggage.    Pertinent History  Cancer (still active).  Neuropathy.      Patient Stated Goals  Get stronger.    Currently in Pain?  Yes    Pain Score  2     Pain Location  Back    Pain Orientation  Upper    Pain Descriptors / Indicators  Discomfort    Pain Type  Chronic pain    Pain Onset  More than a month ago         [facility] PT Assessment - 03/14/17 0001      Assessment   Medical Diagnosis  Thoracic radiculopathy.    Next MD Visit  06/2017      Precautions   Precautions  Fall      Restrictions   Weight Bearing Restrictions  No                  [facility] Adult PT Treatment/Exercise - 03/14/17 0001      Knee/Hip Exercises: Aerobic   Nustep  L5 x15 min      Knee/Hip Exercises: Standing   Heel Raises  Both;2 sets;10 reps    Hip Abduction  AROM;Both;15 reps;Knee straight    Abduction Limitations  intermittant UE support    Step Down  Both;15 reps;Hand Hold:  2;Step Height: 6"      Knee/Hip Exercises: Seated   Long Arc Quad  Strengthening;Both;15 reps;Weights on blue theraball   on blue theraball   Long Arc Quad Weight  4 lbs.    Marching  AROM;Both;2 sets;20 reps    Marching Limitations  on blue theraball for trunk/core control x15 reps      Shoulder Exercises: Seated   Horizontal ABduction  Strengthening;Both;Theraband;20 reps    Theraband Level (Shoulder Horizontal ABduction)  Level 1 (Yellow)    Horizontal ABduction Limitations  Seated on blue theraball    External Rotation  Strengthening;Both;Theraband;20 reps    Theraband Level (Shoulder External Rotation)  Level 1 (Yellow)    External Rotation Limitations  seated on blue theraball    Other Seated Exercises  Seated on blue theraball shoulder diagonals BUE x15 reps yellow theraband          Balance Exercises - 03/14/17 1043      Balance Exercises: Standing   SLS  Eyes open;Foam/compliant surface;Solid surface;Intermittent upper extremity support x3 reps of BLE SLS cone stacking; x5 BLE balance pod touches   x3 reps of BLE SLS cone stacking; x5 BLE balance pod touches   Other Standing Exercises  DLS on BOSU with intermittant UE support x5 min          PT Short Term Goals - 12/22/16 1808      PT SHORT TERM GOAL #1   Title  STG's=LTG's.        PT Long Term Goals - 02/28/17 1001      PT LONG TERM GOAL #1   Title  Independent with a HEP.    Time  8    Period  Weeks    Status  Achieved      PT LONG TERM GOAL #2   Title  Increase thoracic strength to a solid 5/5.    Time  8    Period  Weeks    Status  On-going      PT LONG TERM GOAL #3   Title  Negative Romberg test.    Time  8    Period  Weeks    Status  On-going      PT LONG TERM GOAL #4  Title  Improve Berg score to 49-50/56.    Time  8    Period  Weeks    Status  On-going            Plan - 03/14/17 1056    Clinical Impression Statement  Patient very unstable on therball with LAQ today.  Patient's LEs fatigued quite quickly today with SLS activities as well as isolated strengthening exericses. Patient required intermittant UE support with balance activities due to instability and faitgue. BUE support required with step down exercises due to instabilty and weakness.     Rehab Potential  Good    PT Frequency  2x / week    PT Duration  8 weeks    PT Treatment/Interventions  ADLs/Self Care Home Management;Gait training;Functional mobility training;Therapeutic activities;Therapeutic exercise;Balance training;Neuromuscular re-education    PT Next Visit Plan  Continue with standing hip strengthening/balance exercises as well as posture strengthening per MPT POC.    PT Home Exercise Plan  HEP- standing hip flexion, abduction, chest stretch, heel/toe raise    Consulted and Agree with Plan of Care  Patient       Patient will benefit from skilled therapeutic intervention in order to improve the following deficits and impairments:  Abnormal gait, Decreased activity tolerance, Decreased mobility, Decreased strength, Impaired sensation, Pain, Postural dysfunction  Visit Diagnosis: Pain in thoracic spine  Unsteadiness on feet  Muscle weakness (generalized)     Problem List Patient Active Problem List   Diagnosis Date Noted  . Brain metastasis ([place]) 11/18/2016  . Genetic testing 07/11/2016  . ALLERGY TO CHLORHEXADINE, USE BETADINE ONLY 06/23/2016  . Kyphosis of thoracic region 06/13/2016  . Plantar fasciitis, bilateral 04/20/2016  . Neuropathic pain 01/27/2016  . Anemia of chronic disease 05/21/2015  . Metastatic cancer to spine ([place]) 03/27/2015  . Thrombocytopenia ([place]) 03/12/2015  . Prerenal azotemia 03/12/2015  . Pain   . Recurrent UTI--due to Klebsiella   . Paraplegia at T4 level ([place]) 02/20/2015  . Neurogenic bowel 02/20/2015  . Neurogenic bladder 02/20/2015  . Postoperative anemia due to acute blood loss 02/17/2015  . Spinal cord compression due to malignant neoplasm  metastatic to spine ([place]) 02/15/2015  . Epidural mass 02/15/2015  . Thoracic spine tumor   . Pathologic fracture of vertebra 02/14/2015  . Pathologic fracture of thoracic vertebrae 02/14/2015  . Breast cancer metastasized to bone, right ([place]) 02/14/2015  . Hyperlipidemia 02/14/2015  . H. pylori infection   . HTN (hypertension) 04/17/2012  . Primary cancer of upper outer quadrant of right female breast ([place]) 03/08/2012    Wynelle Fanny, PTA 03/14/2017, 10:59 AM  [facility] [place], [place], [address] Phone: [phone]   Fax:  [phone]  Name: STACEE EARP MRN: [id number] Date of Birth: [birth year]

## 2017-03-15 ENCOUNTER — Ambulatory Visit
Admission: RE | Admit: 2017-03-15 | Discharge: 2017-03-15 | Disposition: A | Discharge status: Home / Self Care | Payer: Medicare Other | Source: Ambulatory Visit | Attending: Radiation Oncology | Admitting: Radiation Oncology

## 2017-03-15 DIAGNOSIS — C50919 Malignant neoplasm of unspecified site of unspecified female breast: Secondary | ICD-10-CM | POA: Diagnosis not present

## 2017-03-15 DIAGNOSIS — G3281 Cerebellar ataxia in diseases classified elsewhere: Secondary | ICD-10-CM | POA: Diagnosis not present

## 2017-03-15 DIAGNOSIS — D4989 Neoplasm of unspecified behavior of other specified sites: Secondary | ICD-10-CM

## 2017-03-15 DIAGNOSIS — C7949 Secondary malignant neoplasm of other parts of nervous system: Secondary | ICD-10-CM

## 2017-03-15 DIAGNOSIS — C7931 Secondary malignant neoplasm of brain: Secondary | ICD-10-CM

## 2017-03-15 MED ORDER — GADOBENATE DIMEGLUMINE 529 MG/ML IV SOLN: 12.0000 mL | Freq: Once | INTRAVENOUS | Status: DC | PRN

## 2017-03-16 NOTE — Progress Notes (Signed)
Isolated rib metastasis and solitary brain metastasis,review 03-15-17 MRI brain, FU.  Weight changes, if any: Wt Readings from Last 3 Encounters:  03/21/17 133 lb (60.3 kg)  03/02/17 134 lb 14.4 oz (61.2 kg)  01/05/17 133 lb (60.3 kg)   Respiratory complaints, if any: Denies SOB, coughing green secretion since Friday, wheezing occasional Hemoptysis, if any: No Swallowing Problems/Pain/Difficulty swallowing:Had some over the weekend with cold symptoms better today. Appetite :Good until she started having cold symptoms on Friday. Pain:No When is next chemo scheduled?:No Imaging:03-15-17 MRI brain Lab work from of chart: 03-02-17 Saw [name] Faslodex along with Herceptin and Perjeta first dose of Faslodex given 06/09/2016 every 4 weeks BP 137/87 (BP Location: Right Leg, Patient Position: Sitting)   Pulse 86   Temp 98.1 F (36.7 C) (Oral)   Resp 18   Ht 5' 3.5" (1.613 m)   Wt 133 lb (60.3 kg)   SpO2 100%   BMI 23.19 kg/m

## 2017-03-20 ENCOUNTER — Ambulatory Visit: Payer: Medicare Other | Admitting: Physical Therapy

## 2017-03-21 ENCOUNTER — Ambulatory Visit
Admission: RE | Admit: 2017-03-21 | Discharge: 2017-03-21 | Disposition: A | Discharge status: Home / Self Care | Payer: Medicare Other | Source: Ambulatory Visit | Attending: Urology | Admitting: Urology

## 2017-03-21 ENCOUNTER — Encounter: Payer: Self-pay | Admitting: Urology

## 2017-03-21 ENCOUNTER — Other Ambulatory Visit: Payer: Self-pay

## 2017-03-21 VITALS — BP 137/87 | HR 86 | Temp 98.1°F | Resp 18 | Ht 63.5 in | Wt 133.0 lb

## 2017-03-21 DIAGNOSIS — C7951 Secondary malignant neoplasm of bone: Secondary | ICD-10-CM

## 2017-03-21 DIAGNOSIS — Z170 Estrogen receptor positive status [ER+]: Secondary | ICD-10-CM | POA: Diagnosis not present

## 2017-03-21 DIAGNOSIS — C50911 Malignant neoplasm of unspecified site of right female breast: Secondary | ICD-10-CM | POA: Diagnosis not present

## 2017-03-21 DIAGNOSIS — C7931 Secondary malignant neoplasm of brain: Secondary | ICD-10-CM

## 2017-03-21 DIAGNOSIS — C50919 Malignant neoplasm of unspecified site of unspecified female breast: Secondary | ICD-10-CM | POA: Diagnosis not present

## 2017-03-21 DIAGNOSIS — Z08 Encounter for follow-up examination after completed treatment for malignant neoplasm: Secondary | ICD-10-CM | POA: Diagnosis not present

## 2017-03-21 DIAGNOSIS — Z79899 Other long term (current) drug therapy: Secondary | ICD-10-CM | POA: Diagnosis not present

## 2017-03-21 DIAGNOSIS — Z885 Allergy status to narcotic agent status: Secondary | ICD-10-CM | POA: Diagnosis not present

## 2017-03-21 NOTE — Addendum Note (Signed)
Encounter addended by: Malena Edman, RN on: 03/21/2017 4:06 PM  Actions taken: Charge Capture section accepted

## 2017-03-21 NOTE — Progress Notes (Signed)
Radiation Oncology         [phone] ________________________________  Name: Heather [name] MRN: [id number]  Date: 03/21/2017  DOB: 02/10/1946  Post Treatment Note  CC: [name], MD  [name], MD  Diagnosis:   71 yo woman with ER positive, PR negative, HER-2 positive metastatic breast cancer with a solitary brain metastasis and painful metastasis to the posterior right 8th rib  Interval Since Last Radiation:  4 months  6/27-7/18/18: 1.  The rib was treated to 30 Gy in 10 fractions 2.  Right cerebellar 6 mm target was treated using 3 Dynamic Conformal Arcs to a prescription dose of 20 Gy in one fraction  04/27/16-05/12/16: Left femur/ 30 Gy in 10 fractions  04/13/2015-05/19/2015: 55 Gy in 25 fractions of 2.2 Gy to the thoracic spine  10/25/12-12/11/12: 50.4 Gy to the right chest wall and regional lymph nodes in 28 fractions with a 10 Gy boost to the right chest wall in 1 session.  Narrative:  The patient returns today for routine follow-up.  The patient has recovered well from the effects of radiotherapy.  She had recent follow up imaging with MRI of the brain and thoracic spine on 03/15/17.  Her case and imaging studies were reviewed at our recent multidisciplinary brain and spine conference 03/20/17 and were felt to be overall stable except for some progressive nodular enhancing soft tissue masses in the subcutaneous tissues posteriorly at the level of prior surgery and felt compatible with metastatic disease. The right cerebellar metastatic disease is no longer visualized following radiation treatment for metastatic disease and there were no new lesions in the brain.   Her systemic disease is currently managed with Herceptin, Perjeta, Faslodex and Zometa under the care and direction of [name].  She has a scheduled follow-up with [name] on 04/26/2017 with plans for repeat PET scan in March 2019.                       On review of systems, the patient states that she is  doing well overall.  She denies headaches, dizziness, tinnitus, changes in visual or auditory acuity, difficulty with speech or memory loss, or difficulty with hand coordination.  She does have some pain in the mid-back when lying flat on her back in general but, particularly when she is lying on on a hard table for imaging studies.  She denies numbness or paresthesias in the upper or lower extremities.  She has noticed significant pain relief since completion of palliative radiation to the right eighth rib. She denies cough, fever, chills, nausea, vomiting, abdominal pain, chest pain or increased shortness of breath. She has some issues with imbalance which she reports was present prior to her [facility] treatment and has not worsened.  She did trip and fall onto her left side at home several weeks ago, injuring her left shoulder and left hip.  The pain is gradually improving and she is not requiring the use of pain medications.  She continues working with physical therapy to regain her strength and balance. She is pleased with her progress to date.   ALLERGIES:  is allergic to other; acyclovir and related; zanaflex [tizanidine hcl]; codeine; keflex [cephalexin]; naprosyn [naproxen]; oxycodone; tramadol; and tussin [guaifenesin].  Meds: Current Outpatient Medications  Medication Sig Dispense Refill  . b complex vitamins tablet Take 1 tablet by mouth daily.    . Calcium Carbonate Antacid (TUMS PO) Take 2 tablets by mouth 2 (two) times daily  as needed (acid reflux).    . Cholecalciferol (VITAMIN D) 2000 UNITS tablet Take 2,000 Units by mouth daily.    . Coenzyme Q10 (COQ10) 100 MG CAPS Take 100 mg by mouth as needed. Days that she takes pravastatin MWF.    Marland Kitchen gabapentin (NEURONTIN) 600 MG tablet Take 1 tablet (600 mg total) by mouth as directed. Take 1 tablet in the AM and afternoon and 1 1/2 tabs at bedtime 315 tablet 2  . HYDROcodone-acetaminophen (NORCO/VICODIN) 5-325 MG tablet     . lidocaine-prilocaine  (EMLA) cream Apply to affected area once (Patient taking differently: Apply 1 application topically every 30 (thirty) days. Apply to port prior to infusions) 30 g 3  . OVER THE COUNTER MEDICATION Place 1 drop into both eyes 2 (two) times daily as needed (dry eyes). Over the counter lubricating eye drops    . Probiotic Product (PROBIOTIC DAILY PO) Take 1 capsule by mouth daily.     . ranitidine (ZANTAC) 300 MG tablet Take 300 mg by mouth at bedtime. Reported on 06/25/2015    . spironolactone (ALDACTONE) 25 MG tablet Take 25 mg by mouth daily.    Marland Kitchen ALPRAZolam (XANAX) 0.25 MG tablet Take 1 tablet by mouth as needed.    . fluticasone (FLONASE) 50 MCG/ACT nasal spray Place 1 spray into both nostrils daily as needed.    . pravastatin (PRAVACHOL) 10 MG tablet Take 10 mg by mouth every Monday, Wednesday, and Friday.      No current facility-administered medications for this encounter.     Physical Findings:  height is 5' 3.5" (1.613 m) and weight is 133 lb (60.3 kg). Her oral temperature is 98.1 F (36.7 C). Her blood pressure is 137/87 and her pulse is 86. Her respiration is 18 and oxygen saturation is 100%.  Pain Assessment Pain Score: 0-No pain/10 In general this is a well appearing Caucasian female in no acute distress. She's alert and oriented x4 and appropriate throughout the examination. Cardiopulmonary assessment is negative for acute distress and she exhibits normal effort. She appears grossly neurologically intact.  PERRLA.  EOMs are intact.  Speech is clear and well organized, without evidence of difficulty with word finding.  There is a palpable subcutaneous nodule to the left of her spine at the level of T8-T9. This is not tender to palpation but she reports that it is painful and she lies flat on her back for any period of time.  Lab Findings: Lab Results  Component Value Date   WBC 4.0 03/02/2017   HGB 12.1 03/02/2017   HCT 37.0 03/02/2017   MCV 83.9 03/02/2017   PLT 187 03/02/2017      Radiographic Findings: Mr Jeri Cos BJ Contrast  Result Date: 03/15/2017 CLINICAL DATA:  Metastatic breast cancer. Right cerebellar lesion treated with radiation. EXAM: MRI HEAD WITHOUT AND WITH CONTRAST TECHNIQUE: Multiplanar, multiecho pulse sequences of the brain and surrounding structures were obtained without and with intravenous contrast. CONTRAST:  12 mL MultiHance IV COMPARISON:  MRI 11/16/2016 FINDINGS: Brain: Right cerebellar treated lesion has resolved. No residual enhancement or edema in this area. No other enhancing mass is identified in the brain. Small nonenhancing white matter hyperintensities bilaterally unchanged. Negative for acute infarct. Negative for hemorrhage Vascular: Normal arterial flow voids Skull and upper cervical spine: 4 mm enhancing lesion in the right frontal bone unchanged from 2013, likely a benign lesion. Sinuses/Orbits: Negative Other: None IMPRESSION: Right cerebellar metastatic disease no longer visualize following radiation treatment for metastatic disease. No  edema. No new lesions in the brain Electronically Signed   By: Franchot Gallo M.D.   On: 03/15/2017 15:35   Mr Thoracic Spine W Wo Contrast  Result Date: 03/15/2017 CLINICAL DATA:  Metastatic breast cancer. EXAM: MRI THORACIC WITHOUT AND WITH CONTRAST TECHNIQUE: Multiplanar and multiecho pulse sequences of the thoracic spine were obtained without and with intravenous contrast. CONTRAST:  12 mL MultiHance IV next healed CT chest 01/18/2017, MRI thoracic spine 07/13/2016 COMPARISON:  None. FINDINGS: MRI THORACIC SPINE FINDINGS Chronic pathologic fracture of T5 with vertebral plana is unchanged. Approximately 10 mm anterolisthesis of C4 relative to C5 is unchanged. Posterior laminectomy approximately T4 through T6 with posterior hardware fusion. Pedicle screw and rod fusion T2 through T11 is causing extensive artifact over the canal and vertebral bodies. This does not allow significant evaluation of the cord.  Despite the artifact, there is bone marrow abnormal bone marrow signal T8 vertebral body on the right consistent with metastatic disease. There is enhancing soft tissue anterior to the right of T8 and T9 compatible with metastatic disease. This is similar to the recent CT chest. No new fracture. Previous metastatic disease to the right posterior eighth rib has been treated with radiation. This appears stable. There is nodular pleural tumor right posterior chest as noted previously. No pleural effusion. 4 x 5 cm airspace density right lower lobe posteriorly was not present on the recent CT could be an area of pneumonia. This could also be related to radiation of the area. Progression of nodular enhancing soft tissue masses in the subcutaneous tissues posteriorly at the level prior laminectomy and surgery. This extends from approximately T3 through T5 and is most consistent with metastatic disease in the subcutaneous tissues. Enhancing mass lesion in the right proximal clavicle compatible with metastatic disease. IMPRESSION: Pathologic fracture T5 unchanged. Spinal cord not adequately evaluated due to extensive artifact from fusion hardware. Infiltrating bone marrow lesion on the right in the T8 vertebral body compatible with metastatic disease. There is paravertebral soft tissue nodular thickening on the right consistent with metastatic disease. No pleural effusion on the right. Stable right eighth rib lesion which has been treated with radiation. Metastatic disease right medial clavicle Progressive nodular enhancing soft tissue masses the subcutaneous tissues posteriorly at the level prior surgery compatible with metastatic disease. 4 x 5 cm airspace density right lower lobe could represent pneumonia or radiation pneumonitis. This was not present on CT of 01/18/2017. Electronically Signed   By: Franchot Gallo M.D.   On: 03/15/2017 15:23    Impression/Plan: 63. 71 yo woman with ER positive breast cancer with a  solitary brain metastasis and painful metastasis to the posterior right 8th rib.  She has recovered well from her most recent radiotherapy and recent imaging studies indicate stability of treated lesions with no new brain lesions.  We will plan to continue with serial brain MRI scans at 3 month intervals for disease surveillance in an effort to detect any new lesions that may warrant further radiotherapy. This will be coordinated with upcoming multidisciplinary brain/spine conferences with plans to present and review her case and imaging prior to her follow-up office visits following each scan.   2. History of T5 metastasis with spinal cord compression.  She continues to recover from surgical decompression/stabilization (surgery x 3 with most recent procedure 06/2016) followed by palliative radiotherapy. Most recent MRI imaging reveals stability of treated disease in the thoracic spine except for some progressive nodular enhancing soft tissue masses in the subcutaneous tissues  posteriorly at the level of prior surgery and felt compatible with metastatic disease.  She will continue on systemic therapy under the care and direction of [name] and has a scheduled follow up visit 04/26/17.  She plans to discuss this finding with him at that time.  We will alternate further spine imaging for surveillance with PET scans as ordered by [name] and MRI T-spine in between.   She is planning a repeat PET scan in 07/2017 and we will follow up regarding those results should there be any indication for further radiotherapy.  She is in agreement with and is comfortable with this plan. She knows to call with any questions or concerns in the interim.    Nicholos Johns, PA-C

## 2017-03-29 ENCOUNTER — Other Ambulatory Visit (HOSPITAL_BASED_OUTPATIENT_CLINIC_OR_DEPARTMENT_OTHER): Payer: Medicare Other

## 2017-03-29 ENCOUNTER — Ambulatory Visit (HOSPITAL_BASED_OUTPATIENT_CLINIC_OR_DEPARTMENT_OTHER): Payer: Medicare Other

## 2017-03-29 ENCOUNTER — Ambulatory Visit: Payer: Medicare Other

## 2017-03-29 VITALS — BP 144/77 | HR 74 | Temp 97.9°F | Resp 17 | Ht 63.5 in | Wt 130.8 lb

## 2017-03-29 DIAGNOSIS — Z5111 Encounter for antineoplastic chemotherapy: Secondary | ICD-10-CM

## 2017-03-29 DIAGNOSIS — C50411 Malignant neoplasm of upper-outer quadrant of right female breast: Secondary | ICD-10-CM

## 2017-03-29 DIAGNOSIS — C7951 Secondary malignant neoplasm of bone: Secondary | ICD-10-CM

## 2017-03-29 DIAGNOSIS — Z5112 Encounter for antineoplastic immunotherapy: Secondary | ICD-10-CM | POA: Diagnosis not present

## 2017-03-29 DIAGNOSIS — C50911 Malignant neoplasm of unspecified site of right female breast: Secondary | ICD-10-CM

## 2017-03-29 LAB — BASIC METABOLIC PANEL
Anion Gap: 8 mEq/L (ref 3–11)
BUN: 10 mg/dL (ref 7.0–26.0)
CO2: 25 mEq/L (ref 22–29)
Calcium: 9.6 mg/dL (ref 8.4–10.4)
Chloride: 103 mEq/L (ref 98–109)
Creatinine: 0.8 mg/dL (ref 0.6–1.1)
EGFR: >60 mL/min/{1.73_m2} (ref >60)
Glucose: 82 mg/dl (ref 70–140)
Potassium: 4.2 mEq/L (ref 3.5–5.1)
Sodium: 137 mEq/L (ref 136–145)

## 2017-03-29 MED ORDER — TRASTUZUMAB CHEMO 150 MG IV SOLR
6.0000 mg/kg | Freq: Once | INTRAVENOUS | Status: AC
  Administered 2017-03-29: 378 mg via INTRAVENOUS
  Filled 2017-03-29: qty 18

## 2017-03-29 MED ORDER — SODIUM CHLORIDE 0.9 % IV SOLN
420.0000 mg | Freq: Once | INTRAVENOUS | Status: AC
  Administered 2017-03-29: 420 mg via INTRAVENOUS
  Filled 2017-03-29: qty 14

## 2017-03-29 MED ORDER — HEPARIN SOD (PORK) LOCK FLUSH 100 UNIT/ML IV SOLN
500.0000 [IU] | Freq: Once | INTRAVENOUS | Status: AC | PRN
  Administered 2017-03-29: 500 [IU]
  Filled 2017-03-29: qty 5

## 2017-03-29 MED ORDER — SODIUM CHLORIDE 0.9 % IV SOLN
Freq: Once | INTRAVENOUS | Status: AC
  Administered 2017-03-29: 09:00:00 via INTRAVENOUS

## 2017-03-29 MED ORDER — FULVESTRANT 250 MG/5ML IM SOLN
500.0000 mg | Freq: Once | INTRAMUSCULAR | Status: AC
  Administered 2017-03-29: 500 mg via INTRAMUSCULAR
  Filled 2017-03-29: qty 10

## 2017-03-29 MED ORDER — SODIUM CHLORIDE 0.9% FLUSH
10.0000 mL | INTRAVENOUS | Status: DC | PRN
  Administered 2017-03-29: 10 mL
  Filled 2017-03-29: qty 10

## 2017-03-29 MED ORDER — ACETAMINOPHEN 325 MG PO TABS
ORAL_TABLET | ORAL | Status: AC
  Filled 2017-03-29: qty 2

## 2017-03-29 MED ORDER — ACETAMINOPHEN 325 MG PO TABS
650.0000 mg | ORAL_TABLET | Freq: Once | ORAL | Status: AC
  Administered 2017-03-29: 650 mg via ORAL

## 2017-03-29 MED ORDER — SODIUM CHLORIDE 0.9 % IJ SOLN
10.0000 mL | INTRAMUSCULAR | Status: DC | PRN
  Administered 2017-03-29: 10 mL via INTRAVENOUS
  Filled 2017-03-29: qty 10

## 2017-03-29 MED ORDER — DIPHENHYDRAMINE HCL 25 MG PO CAPS
ORAL_CAPSULE | ORAL | Status: AC
  Filled 2017-03-29: qty 1

## 2017-03-29 MED ORDER — DIPHENHYDRAMINE HCL 25 MG PO CAPS
50.0000 mg | ORAL_CAPSULE | Freq: Once | ORAL | Status: AC
  Administered 2017-03-29: 25 mg via ORAL

## 2017-03-29 NOTE — Patient Instructions (Signed)
[facility] Discharge Instructions for Patients Receiving Chemotherapy  Today you received the following chemotherapy agents: Trastuzumab (Herceptin) and Pertuzumab (Perjeta)  To help prevent nausea and vomiting after your treatment, we encourage you to take your nausea medication  as prescribed.    If you develop nausea and vomiting that is not controlled by your nausea medication, call the clinic.   BELOW ARE SYMPTOMS THAT SHOULD BE REPORTED IMMEDIATELY:  *FEVER GREATER THAN 100.5 F  *CHILLS WITH OR WITHOUT FEVER  NAUSEA AND VOMITING THAT IS NOT CONTROLLED WITH YOUR NAUSEA MEDICATION  *UNUSUAL SHORTNESS OF BREATH  *UNUSUAL BRUISING OR BLEEDING  TENDERNESS IN MOUTH AND THROAT WITH OR WITHOUT PRESENCE OF ULCERS  *URINARY PROBLEMS  *BOWEL PROBLEMS  UNUSUAL RASH Items with * indicate a potential emergency and should be followed up as soon as possible.  Feel free to call the clinic should you have any questions or concerns. The clinic phone number is [phone].  Please show the CHEMO ALERT CARD at check-in to the Emergency Department and triage nurse.   

## 2017-03-29 NOTE — Progress Notes (Signed)
Ok to treat without CBC today. CBC/CMET will need to be drawn next cycle.  Cyndia Bent RN

## 2017-04-11 ENCOUNTER — Encounter: Payer: Self-pay | Admitting: Physical Therapy

## 2017-04-11 ENCOUNTER — Ambulatory Visit: Payer: Medicare Other | Attending: Neurosurgery | Admitting: Physical Therapy

## 2017-04-11 DIAGNOSIS — R2681 Unsteadiness on feet: Secondary | ICD-10-CM | POA: Insufficient documentation

## 2017-04-11 DIAGNOSIS — M6281 Muscle weakness (generalized): Secondary | ICD-10-CM | POA: Insufficient documentation

## 2017-04-11 DIAGNOSIS — R269 Unspecified abnormalities of gait and mobility: Secondary | ICD-10-CM | POA: Insufficient documentation

## 2017-04-11 DIAGNOSIS — M546 Pain in thoracic spine: Secondary | ICD-10-CM

## 2017-04-11 NOTE — Therapy (Signed)
[facility] [address] [place], [place], [address] Phone: [phone]   Fax:  [phone]  Physical Therapy Treatment  Patient Details  Name: Heather [name] MRN: [id number] Date of Birth: 08/19/1945 Referring Provider: neelesh Nundkumar MD   Encounter Date: 04/11/2017  PT End of Session - 04/11/17 1004    Visit Number  12    Number of Visits  16    Date for PT Re-Evaluation  02/20/17    PT Start Time  0949    PT Stop Time  1031    PT Time Calculation (min)  42 min    Activity Tolerance  Patient tolerated treatment well    Behavior During Therapy  WFL for tasks assessed/performed       Past Medical History:  Diagnosis Date  . Anemia    hx of  . Anxiety    r/t updated surgery  . Breast cancer (HCC)    right  . Cataract    immature-not sure of which eye  . Complication of anesthesia    pt states she is sensitive to meds  . Dizziness    has been going on for 3months;medical MD aware  . Genetic testing 07/11/2016   Test Results: Normal; No pathogenic mutations detected  Genes Analyzed: 43 genes on [employer]'s Common Cancers panel (APC, ATM, AXIN2, BARD1, BMPR1A, BRCA1, BRCA2, BRIP1, CDH1, CDKN2A, CHEK2, DICER1, EPCAM, GREM1, HOXB13, KIT, MEN1, MLH1, MSH2, MSH6, MUTYH, NBN, NF1, PALB2, PDGFRA, PMS2, POLD1, POLE, PTEN, RAD50, RAD51C, RAD51D, SDHA, SDHB, SDHC, SDHD, SMAD4, SMARCA4, STK11, TP53, TSC1, TSC2, VHL).  . GERD (gastroesophageal reflux disease)    Tums prn  . H. pylori infection   . H/O hiatal hernia   . Hemorrhoids   . History of UTI   . Hx of radiation therapy 10/25/12- 12/11/12   right chest wall/regional lymph nodes 5040 cGy, 28 sessions, right chest wall boost 1000 cGy 1 session  . Hx: UTI (urinary tract infection)   . Hyperlipidemia    but not on meds;diet and exercise controlled  . Hypertension    recently started Aldactone   . Insomnia    takes Melatoniin daily  . Metastasis to spinal column (HCC)    T5, Left   Femur cancer- radiation   . Neuropathy    "from back surgery"  . PONV (postoperative nausea and vomiting)    pt experienced hair loss, confusion and combative- 02/15/15  . Right knee pain   . Right shoulder pain   . Skin cancer    squamous, Nodule to back - "same cancer as breast."  . Urinary frequency    d/t taking Aldactone    Past Surgical History:  Procedure Laterality Date  . COLONOSCOPY    . cyst removed from left breast  1970  . DECOMPRESSIVE LUMBAR LAMINECTOMY LEVEL 4 N/A 02/15/2015   Procedure: DECOMPRESSION T5 AND T3-T7 STABALIZATION;  Surgeon: Neelesh Nundkumar, MD;  Location: [facility];  Service: Neurosurgery;  Laterality: N/A;  . ESOPHAGOGASTRODUODENOSCOPY    . LAMINECTOMY N/A 03/27/2015   Procedure: Thoracic Four-Thoracic Six Laminectomy for tumor;  Surgeon: Neelesh Nundkumar, MD;  Location: [facility];  Service: Neurosurgery;  Laterality: N/A;  T4-T6 Laminectomy  . left wrist surgery   2004   with plate  . MASTECTOMY MODIFIED RADICAL  05/10/2012   Procedure: MASTECTOMY MODIFIED RADICAL;  Surgeon: Paul S Toth III, MD;  Location: [facility];  Service: General;  Laterality: Right;  RIGHT MODIFIED RADICAL MASTECTOMY  . MASTECTOMY, RADICAL   Right   . MINOR BREAST BIOPSY Left 05/16/2016   Procedure: EXCISION OF BACK NODULE;  Surgeon: Autumn Messing III, MD;  Location: [place];  Service: General;  Laterality: Left;  . Nodule  back  05/17/2015  . PORTACATH PLACEMENT  05/10/2012   Procedure: INSERTION PORT-A-CATH;  Surgeon: Merrie Roof, MD;  Location: [place];  Service: General;  Laterality: Left;    There were no vitals filed for this visit.  Subjective Assessment - 04/11/17 0951    Subjective  Reports that she became sick and was unable to exercise for several weeks. Reports that she has has been going up and down stairs as well as ladder with legs buckling. Reports that she found a knot in area lateral to spine around T7 region.    Pertinent History  Cancer (still  active).  Neuropathy.      Patient Stated Goals  Get stronger.    Currently in Pain?  Yes    Pain Score  -- No description provided by patient     Pain Location  Back         [facility] PT Assessment - 04/11/17 0001      Assessment   Medical Diagnosis  Thoracic radiculopathy.    Next MD Visit  06/2017      Precautions   Precautions  Fall      Restrictions   Weight Bearing Restrictions  No                  [facility] Adult PT Treatment/Exercise - 04/11/17 0001      Knee/Hip Exercises: Aerobic   Nustep  L5 x15 min          Balance Exercises - 04/11/17 1008      Balance Exercises: Standing   Standing Eyes Opened  Narrow base of support (BOS);Foam/compliant surface x4 min DLS;     Tandem Stance  Eyes open;Foam/compliant surface x4 min    SLS  Eyes open;Solid surface;Intermittent upper extremity support BLE balance pods x5 reps each    Rockerboard  Anterior/posterior;EO;Intermittent UE support x5 min    Cone Rotation  Foam/compliant surface;Intermittent upper extremity assist;Right turn;Left turn from cabinet to low bench with cones    Other Standing Exercises  DLS on BOSU with intermittant UE support x5 min          PT Short Term Goals - 12/22/16 1808      PT SHORT TERM GOAL #1   Title  STG's=LTG's.        PT Long Term Goals - 02/28/17 1001      PT LONG TERM GOAL #1   Title  Independent with a HEP.    Time  8    Period  Weeks    Status  Achieved      PT LONG TERM GOAL #2   Title  Increase thoracic strength to a solid 5/5.    Time  8    Period  Weeks    Status  On-going      PT LONG TERM GOAL #3   Title  Negative Romberg test.    Time  8    Period  Weeks    Status  On-going      PT LONG TERM GOAL #4   Title  Improve Berg score to 49-50/56.    Time  8    Period  Weeks    Status  On-going  Plan - 04/11/17 1120    Clinical Impression Statement  Patient tolerated today's treatment fairly well as she returned from PT after being  sick. Patient more interested in working on more balance today. Patient very unstable and weak secondary to weakness and lack of PT in recent weeks secondary to sickness. Patient still only required intermittant UE support with balance exercises.    Rehab Potential  Good    PT Frequency  2x / week    PT Duration  8 weeks    PT Treatment/Interventions  ADLs/Self Care Home Management;Gait training;Functional mobility training;Therapeutic activities;Therapeutic exercise;Balance training;Neuromuscular re-education    PT Next Visit Plan  Continue with standing hip strengthening/balance exercises as well as posture strengthening per MPT POC.    PT Home Exercise Plan  HEP- standing hip flexion, abduction, chest stretch, heel/toe raise    Consulted and Agree with Plan of Care  Patient       Patient will benefit from skilled therapeutic intervention in order to improve the following deficits and impairments:  Abnormal gait, Decreased activity tolerance, Decreased mobility, Decreased strength, Impaired sensation, Pain, Postural dysfunction  Visit Diagnosis: Pain in thoracic spine  Unsteadiness on feet  Muscle weakness (generalized)  Abnormality of gait     Problem List Patient Active Problem List   Diagnosis Date Noted  . Brain metastasis (HCC) 11/18/2016  . Genetic testing 07/11/2016  . ALLERGY TO CHLORHEXADINE, USE BETADINE ONLY 06/23/2016  . Kyphosis of thoracic region 06/13/2016  . Plantar fasciitis, bilateral 04/20/2016  . Neuropathic pain 01/27/2016  . Anemia of chronic disease 05/21/2015  . Metastatic cancer to spine (HCC) 03/27/2015  . Thrombocytopenia (HCC) 03/12/2015  . Prerenal azotemia 03/12/2015  . Pain   . Recurrent UTI--due to Klebsiella   . Paraplegia at T4 level (HCC) 02/20/2015  . Neurogenic bowel 02/20/2015  . Neurogenic bladder 02/20/2015  . Postoperative anemia due to acute blood loss 02/17/2015  . Spinal cord compression due to malignant neoplasm metastatic to  spine (HCC) 02/15/2015  . Epidural mass 02/15/2015  . Thoracic spine tumor   . Pathologic fracture of vertebra 02/14/2015  . Pathologic fracture of thoracic vertebrae 02/14/2015  . Breast cancer metastasized to bone, right (HCC) 02/14/2015  . Hyperlipidemia 02/14/2015  . H. pylori infection   . HTN (hypertension) 04/17/2012  . Primary cancer of upper outer quadrant of right female breast (HCC) 03/08/2012    Kelsey P Kennon, PTA 04/11/2017, 11:26 AM  [facility] [address] [place], [place], [address] Phone: [phone]   Fax:  [phone]  Name: Heather [name] MRN: [id number] Date of Birth: 02/05/1946   

## 2017-04-11 NOTE — Addendum Note (Signed)
Addended by: Adie Vilar, [place] W on: 04/11/2017 01:30 PM   Modules accepted: Orders

## 2017-04-12 ENCOUNTER — Encounter: Payer: Self-pay | Admitting: Adult Health

## 2017-04-12 ENCOUNTER — Ambulatory Visit (HOSPITAL_COMMUNITY)
Admission: RE | Admit: 2017-04-12 | Discharge: 2017-04-12 | Disposition: A | Discharge status: Home / Self Care | Payer: Medicare Other | Source: Ambulatory Visit | Attending: Internal Medicine | Admitting: Internal Medicine

## 2017-04-12 ENCOUNTER — Telehealth: Payer: Self-pay

## 2017-04-12 ENCOUNTER — Ambulatory Visit (HOSPITAL_BASED_OUTPATIENT_CLINIC_OR_DEPARTMENT_OTHER)
Admission: RE | Admit: 2017-04-12 | Discharge: 2017-04-12 | Disposition: A | Discharge status: Home / Self Care | Payer: Medicare Other | Source: Ambulatory Visit | Attending: Internal Medicine | Admitting: Internal Medicine

## 2017-04-12 ENCOUNTER — Encounter (HOSPITAL_COMMUNITY): Payer: Self-pay | Admitting: Internal Medicine

## 2017-04-12 ENCOUNTER — Ambulatory Visit (HOSPITAL_BASED_OUTPATIENT_CLINIC_OR_DEPARTMENT_OTHER): Payer: Medicare Other | Admitting: Adult Health

## 2017-04-12 ENCOUNTER — Other Ambulatory Visit: Payer: Self-pay | Admitting: Adult Health

## 2017-04-12 ENCOUNTER — Other Ambulatory Visit: Payer: Self-pay

## 2017-04-12 VITALS — BP 138/87 | HR 78 | Wt 133.4 lb

## 2017-04-12 DIAGNOSIS — E785 Hyperlipidemia, unspecified: Secondary | ICD-10-CM | POA: Diagnosis not present

## 2017-04-12 DIAGNOSIS — C7951 Secondary malignant neoplasm of bone: Secondary | ICD-10-CM

## 2017-04-12 DIAGNOSIS — Z09 Encounter for follow-up examination after completed treatment for conditions other than malignant neoplasm: Secondary | ICD-10-CM | POA: Insufficient documentation

## 2017-04-12 DIAGNOSIS — I10 Essential (primary) hypertension: Secondary | ICD-10-CM | POA: Insufficient documentation

## 2017-04-12 DIAGNOSIS — F419 Anxiety disorder, unspecified: Secondary | ICD-10-CM | POA: Insufficient documentation

## 2017-04-12 DIAGNOSIS — G4700 Insomnia, unspecified: Secondary | ICD-10-CM | POA: Insufficient documentation

## 2017-04-12 DIAGNOSIS — K219 Gastro-esophageal reflux disease without esophagitis: Secondary | ICD-10-CM | POA: Insufficient documentation

## 2017-04-12 DIAGNOSIS — C50911 Malignant neoplasm of unspecified site of right female breast: Secondary | ICD-10-CM | POA: Insufficient documentation

## 2017-04-12 DIAGNOSIS — C50919 Malignant neoplasm of unspecified site of unspecified female breast: Secondary | ICD-10-CM

## 2017-04-12 DIAGNOSIS — C7989 Secondary malignant neoplasm of other specified sites: Secondary | ICD-10-CM | POA: Diagnosis not present

## 2017-04-12 DIAGNOSIS — C50411 Malignant neoplasm of upper-outer quadrant of right female breast: Secondary | ICD-10-CM | POA: Diagnosis not present

## 2017-04-12 DIAGNOSIS — Z79899 Other long term (current) drug therapy: Secondary | ICD-10-CM | POA: Insufficient documentation

## 2017-04-12 DIAGNOSIS — I340 Nonrheumatic mitral (valve) insufficiency: Secondary | ICD-10-CM | POA: Insufficient documentation

## 2017-04-12 DIAGNOSIS — Z170 Estrogen receptor positive status [ER+]: Secondary | ICD-10-CM

## 2017-04-12 NOTE — Patient Instructions (Signed)
We will contact you in 6 months to schedule your next appointment and echocardiogram  

## 2017-04-12 NOTE — Assessment & Plan Note (Signed)
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 MRI 02/14/2015: T5 large destructive lesion with pathologic compression fracture and extensive epidural tumor, involvement of T4, excision metastatic carcinoma ER 60%, PR 0%, HER-2 positive ratio 2.71 average copy #7.45 03/27/2015: T4-6 decompression surgery for spinal cord compression PET-CT 12/10/17Rt para-spinal mass mildy hypermetabolic SUV 5.9, lytic cortical lesion 4.8 cm lesion left prox femur suv 8.8 favor osseus met disease   Treatment summary: 1. Resumed anastrozole 05/21/2015, but it has caused significant hair loss so we switched her to exemestane 25 mg once daily 07/23/2015 2. Herceptin every 3 week started 04/10/2015 3. Bone metastases: Zometa every 3 months. 4. Radiation therapy to the spine (Dr. [name]) 04/13/2015 to 05/19/2015 5. Radiation therapy to left femur 04/28/2016 to 05/12/2016  6. SRS to the brain lesion 11/24/2016 -------------------------------------------------------------------------------------------------------------------------- Goal of treatment: Palliation Treatment plan:Faslodex along with Herceptin and Perjeta first dose of Faslodex given 06/09/2016 every 4 weeks  Her last scans were in 01/2017.  There is concern for progression.  We will get PET scan within the next 1-2 weeks and depending on results likely switch to Kadcyla.  I gave her info on this today.  We also may need to biopsy this lesion as well.  This was reviewed by myself and [name] with the patient.  She verbalized understanding.  Our goal is to get the PET scan done and review with her on 12/19 or sooner.

## 2017-04-12 NOTE — Telephone Encounter (Signed)
PET scan arranged for pt for 04/28/17 at 7:30 am at [facility] radiology.  Pt is aware.

## 2017-04-12 NOTE — Progress Notes (Signed)
  Echocardiogram 2D Echocardiogram has been performed.  Jennette Dubin 04/12/2017, 9:37 AM

## 2017-04-12 NOTE — Patient Instructions (Signed)
Tykerb (Lapatinib), Xeloda (Capecitabine)  Kadcyla (Ado-Trastuzumab Emtansine)  Ado-Trastuzumab Emtansine for injection What is this medicine? ADO-TRASTUZUMAB EMTANSINE (ADD oh traz TOO zuh mab em TAN zine) is a monoclonal antibody combined with chemotherapy. It is used to treat breast cancer. This medicine may be used for other purposes; ask your health care provider or pharmacist if you have questions. COMMON BRAND NAME(S): Kadcyla What should I tell my health care provider before I take this medicine? They need to know if you have any of these conditions: -heart disease -heart failure -infection (especially a virus infection such as chickenpox, cold sores, or herpes) -liver disease -lung or breathing disease, like asthma -an unusual or allergic reaction to ado-trastuzumab emtansine, other medications, foods, dyes, or preservatives -pregnant or trying to get pregnant -breast-feeding How should I use this medicine? This medicine is for infusion into a vein. It is given by a health care professional in a hospital or clinic setting. Talk to your pediatrician regarding the use of this medicine in children. Special care may be needed. Overdosage: If you think you have taken too much of this medicine contact a poison control center or emergency room at once. NOTE: This medicine is only for you. Do not share this medicine with others. What if I miss a dose? It is important not to miss your dose. Call your doctor or health care professional if you are unable to keep an appointment. What may interact with this medicine? This medicine may also interact with the following medications: -atazanavir -boceprevir -clarithromycin -delavirdine -indinavir -dalfopristin; quinupristin -isoniazid, INH -itraconazole -ketoconazole -nefazodone -nelfinavir -ritonavir -telaprevir -telithromycin -tipranavir -voriconazole This list may not describe all possible interactions. Give your health care  provider a list of all the medicines, herbs, non-prescription drugs, or dietary supplements you use. Also tell them if you smoke, drink alcohol, or use illegal drugs. Some items may interact with your medicine. What should I watch for while using this medicine? Visit your doctor for checks on your progress. This drug may make you feel generally unwell. This is not uncommon, as chemotherapy can affect healthy cells as well as cancer cells. Report any side effects. Continue your course of treatment even though you feel ill unless your doctor tells you to stop. You may need blood work done while you are taking this medicine. Call your doctor or health care professional for advice if you get a fever, chills or sore throat, or other symptoms of a cold or flu. Do not treat yourself. This drug decreases your body's ability to fight infections. Try to avoid being around people who are sick. Be careful brushing and flossing your teeth or using a toothpick because you may get an infection or bleed more easily. If you have any dental work done, tell your dentist you are receiving this medicine. Avoid taking products that contain aspirin, acetaminophen, ibuprofen, naproxen, or ketoprofen unless instructed by your doctor. These medicines may hide a fever. Do not become pregnant while taking this medicine or for 7 months after stopping it, men with female partners should use contraception during treatment and for 4 months after the last dose. Women should inform their doctor if they wish to become pregnant or think they might be pregnant. There is a potential for serious side effects to an unborn child. Do not breast-feed an infant while taking this medicine or for 7 months after the last dose. Men who have a partner who is pregnant or who is capable of becoming pregnant should use a condom  during sexual activity while taking this medicine and for 4 months after stopping it. Men should inform their doctors if they wish to  father a child. This medicine may lower sperm counts. Talk to your health care professional or pharmacist for more information. What side effects may I notice from receiving this medicine? Side effects that you should report to your doctor or health care professional as soon as possible: -allergic reactions like skin rash, itching or hives, swelling of the face, lips, or tongue -breathing problems -chest pain or palpitations -fever or chills, sore throat -general ill feeling or flu-like symptoms -light-colored stools -nausea, vomiting -pain, tingling, numbness in the hands or feet -signs and symptoms of bleeding such as bloody or black, tarry stools; red or dark-brown urine; spitting up blood or brown material that looks like coffee grounds; red spots on the skin; unusual bruising or bleeding from the eye, gums, or nose -swelling of the legs or ankles -yellowing of the eyes or skin Side effects that usually do not require medical attention (report to your doctor or health care professional if they continue or are bothersome): -changes in taste -constipation -dizziness -headache -joint pain -muscle pain -trouble sleeping -unusually weak or tired This list may not describe all possible side effects. Call your doctor for medical advice about side effects. You may report side effects to FDA at 1-800-FDA-1088. Where should I keep my medicine? This drug is given in a hospital or clinic and will not be stored at home. NOTE: This sheet is a summary. It may not cover all possible information. If you have questions about this medicine, talk to your doctor, pharmacist, or health care provider.  2018 Elsevier/Gold Standard (2015-06-15 12:11:06)

## 2017-04-12 NOTE — Progress Notes (Addendum)
[name]:    Heather Stains, [name]   DIAGNOSIS: Cancer Staging Primary cancer of upper outer quadrant of right female breast [facility]) Staging form: Breast, AJCC 7th Edition - Clinical: Stage IIB (T2, N1, cM0) - Unsigned Staging comments: Staged at breast conference 11.6.13  - Pathologic: No stage assigned - Unsigned   SUMMARY OF ONCOLOGIC HISTORY:   Primary cancer of upper outer quadrant of right female breast ([place])   03/08/2012 Initial Diagnosis    Right breast invasive ductal carcinoma ER positive PR negative HER-2 positive Ki-67 44%; another breast mass biopsied in the anterior part of the breast which was also positive for malignancy that was HER-2 negative      05/10/2012 Surgery    Right mastectomy and axillary lymph node dissection: Multifocal disease 5 cm, 1.7 cm, 1.6 cm, ER positive PR negative HER-2 positive Ki-67 44%, 3/7 lymph nodes positive      06/14/2012 - 06/12/2013 Chemotherapy    Adjuvant chemotherapy with [place] 6 followed by Herceptin maintenance      10/25/2012 - 12/11/2012 Radiation Therapy    Adjuvant radiation therapy      02/07/2013 -  Anti-estrogen oral therapy    Letrozole 2.5 mg daily      02/14/2015 Imaging    MRI spine: Large destructive T5 lesion with severe pathologic compression fracture and extensive epidural tumor, severe spinal stenosis with moderate cord compression, tumor involvement of T4      03/18/2015 PET scan    Residual enhancing soft tissue adjacent to the spinal cord. No evidence of metastatic disease. Nonspecific uptake the left nipple      03/27/2015 - 03/30/2015 Hospital Admission    T4-6 decompression for spinal cord compression (lower extremity paralysis)      04/10/2015 -  Chemotherapy    Palliative treatment with Herceptin every 3 weeks with letrozole 2.5 mg daily      04/13/2015 - 05/19/2015 Radiation Therapy    Palliative radiation treatment to  the spine      09/01/2015 Imaging    CT chest abdomen pelvis: Pathologic fracture with posterior fusion at T5 no other evidence of metastatic disease in the chest abdomen pelvis      12/23/2015 Imaging    CT CAP: new nonspecific 0.6 cm lymph node was stated mediastinum needs follow-up CT, innumerable tiny groundglass pulmonary nodules throughout both lungs unchanged, patchy consolidation from radiation, T5 fracture, no mets      04/15/2016 PET scan    Rt para-spinal mass mildy hypermetabolic SUV 5.9, lytic cortical lesion 4.8 cm lesion left prox femur suv 8.8 favor osseus met disease      04/28/2016 - 05/12/2016 Radiation Therapy    Palliative XRT Left femur      05/24/2016 Procedure    Soft tissue mass biopsy back: Metastatic breast cancer ER 80%, PR 0%, Ki-67 50%, HER-2 positive ratio 2.25      06/09/2016 -  Anti-estrogen oral therapy    Faslodex with Herceptin and Perjeta every 4 weeks      06/13/2016 - 06/15/2016 Hospital Admission    uncomplicated revision of previous thoracic instrumentation.      10/10/2016 PET scan    Interval development of new hypermetabolic 2.5 x 1.1 cm lesion posterior right eighth rib consistent with metastatic disease, stable right paraspinal lesion at T8, clustered soft tissue nodules in the posterior midline back near cervicothoracic junction smaller than before  11/03/2016 - 11/07/2016 Radiation Therapy    Palliative radiation to right eighth rib      11/04/2016 Imaging    Solitary contrast-enhancing lesion in the right cerebellum 6 x 2 mm concerning for metastatic lesion      11/24/2016 - 11/24/2016 Radiation Therapy    SRS to the brain lesion       CURRENT THERAPY:Herceptin/Perjeta  INTERVAL HISTORY: Heather [name] 71 y.o. female returns for evaluation of a lesion on her back.  She has noted that this has been there for a few weeks and has been getting worse.  She thought it was the same thing that was being followed near T8, however this is on  her upper back.  She was seeing [name], for her regular follow up, and he looked at it, and urged her to come here for evaluation, since it has been worsening, and not wait for her appt with [name] on 12/19.    Otherwise, she denies any new pain or current issues.  She underwent echo today which looked good.  She tells me that Dr. Haroldine Laws has stretched her out to every 6 month surveillance at this point.     Patient Active Problem List   Diagnosis Date Noted  . Brain metastasis ([place]) 11/18/2016  . Genetic testing 07/11/2016  . ALLERGY TO CHLORHEXADINE, USE BETADINE ONLY 06/23/2016  . Kyphosis of thoracic region 06/13/2016  . Plantar fasciitis, bilateral 04/20/2016  . Neuropathic pain 01/27/2016  . Anemia of chronic disease 05/21/2015  . Metastatic cancer to spine ([place]) 03/27/2015  . Thrombocytopenia ([place]) 03/12/2015  . Prerenal azotemia 03/12/2015  . Pain   . Recurrent UTI--due to Klebsiella   . Paraplegia at T4 level ([place]) 02/20/2015  . Neurogenic bowel 02/20/2015  . Neurogenic bladder 02/20/2015  . Postoperative anemia due to acute blood loss 02/17/2015  . Spinal cord compression due to malignant neoplasm metastatic to spine ([place]) 02/15/2015  . Epidural mass 02/15/2015  . Thoracic spine tumor   . Pathologic fracture of vertebra 02/14/2015  . Pathologic fracture of thoracic vertebrae 02/14/2015  . Breast cancer metastasized to bone, right ([place]) 02/14/2015  . Hyperlipidemia 02/14/2015  . H. pylori infection   . HTN (hypertension) 04/17/2012  . Primary cancer of upper outer quadrant of right female breast ([place]) 03/08/2012    is allergic to other; acyclovir and related; zanaflex [tizanidine hcl]; codeine; keflex [cephalexin]; naprosyn [naproxen]; oxycodone; tramadol; and tussin [guaifenesin].  MEDICAL HISTORY: Past Medical History:  Diagnosis Date  . Anemia    hx of  . Anxiety    r/t updated surgery  . Breast cancer ([place])    right  . Cataract     immature-not sure of which eye  . Complication of anesthesia    pt states she is sensitive to meds  . Dizziness    has been going on for 79month;medical MD aware  . Genetic testing 07/11/2016   Test Results: Normal; No pathogenic mutations detected  Genes Analyzed: 43 genes on [employer]'s Common Cancers panel (APC, ATM, AXIN2, BARD1, BMPR1A, BRCA1, BRCA2, BRIP1, CDH1, CDKN2A, CHEK2, DICER1, EPCAM, GREM1, HOXB13, KIT, MEN1, MLH1, MSH2, MSH6, MUTYH, NBN, NF1, PALB2, PDGFRA, PMS2, POLD1, POLE, PTEN, RAD50, RAD51C, RAD51D, SDHA, SDHB, SDHC, SDHD, SMAD4, SMARCA4, STK11, TP53, TSC1, TSC2, VHL).  .Marland KitchenGERD (gastroesophageal reflux disease)    Tums prn  . H. pylori infection   . H/O hiatal hernia   . Hemorrhoids   . History of UTI   . Hx of  radiation therapy 10/25/12- 12/11/12   right chest wall/regional lymph nodes 5040 cGy, 28 sessions, right chest wall boost 1000 cGy 1 session  . Hx: UTI (urinary tract infection)   . Hyperlipidemia    but not on meds;diet and exercise controlled  . Hypertension    recently started Aldactone   . Insomnia    takes Melatoniin daily  . Metastasis to spinal column ([facility])    T5, Left  Femur cancer- radiation   . Neuropathy    "from back surgery"  . PONV (postoperative nausea and vomiting)    pt experienced hair loss, confusion and combative- 02/15/15  . Right knee pain   . Right shoulder pain   . Skin cancer    squamous, Nodule to back - "same cancer as breast."  . Urinary frequency    d/t taking Aldactone    SURGICAL HISTORY: Past Surgical History:  Procedure Laterality Date  . COLONOSCOPY    . cyst removed from left breast  1970  . DECOMPRESSIVE LUMBAR LAMINECTOMY LEVEL 4 N/A 02/15/2015   Procedure: DECOMPRESSION T5 AND T3-T7 STABALIZATION;  Surgeon: Consuella Lose, MD;  Location: [facility];  Service: Neurosurgery;  Laterality: N/A;  . ESOPHAGOGASTRODUODENOSCOPY    . LAMINECTOMY N/A 03/27/2015   Procedure: Thoracic Four-Thoracic Six Laminectomy for  tumor;  Surgeon: Consuella Lose, MD;  Location: [facility];  Service: Neurosurgery;  Laterality: N/A;  T4-T6 Laminectomy  . left wrist surgery   2004   with plate  . MASTECTOMY MODIFIED RADICAL  05/10/2012   Procedure: MASTECTOMY MODIFIED RADICAL;  Surgeon: Merrie Roof, MD;  Location: [facility];  Service: General;  Laterality: Right;  RIGHT MODIFIED RADICAL MASTECTOMY  . MASTECTOMY, RADICAL Right   . MINOR BREAST BIOPSY Left 05/16/2016   Procedure: EXCISION OF BACK NODULE;  Surgeon: Autumn Messing III, MD;  Location: [facility];  Service: General;  Laterality: Left;  . Nodule  back  05/17/2015  . PORTACATH PLACEMENT  05/10/2012   Procedure: INSERTION PORT-A-CATH;  Surgeon: Merrie Roof, MD;  Location[facility];  Service: General;  Laterality: Left;    SOCIAL HISTORY: Social History   Socioeconomic History  . Marital status: Married    Spouse name: Not on file  . Number of children: 2  . Years of education: Not on file  . Highest education level: Not on file  Social Needs  . Financial resource strain: Not on file  . Food insecurity - worry: Not on file  . Food insecurity - inability: Not on file  . Transportation needs - medical: Not on file  . Transportation needs - non-medical: Not on file  Occupational History  . Not on file  Tobacco Use  . Smoking status: Never Smoker  . Smokeless tobacco: Never Used  Substance and Sexual Activity  . Alcohol use: No  . Drug use: No  . Sexual activity: Yes    Birth control/protection: Post-menopausal  Other Topics Concern  . Not on file  Social History Narrative  . Not on file    FAMILY HISTORY: Family History  Problem Relation Age of Onset  . Leukemia Mother 4       deceased 72  . Stroke Father   . Diabetes Mellitus II Father   . Prostate cancer Brother 40       currently 49  . Stomach cancer Maternal Grandmother 46       deceased 38  . Prostate cancer Brother 36  currently 75    Review of Systems   Constitutional: Negative for appetite change, chills, diaphoresis, fatigue, fever and unexpected weight change.  HENT:   Negative for hearing loss, lump/mass and mouth sores.   Eyes: Negative for eye problems and icterus.  Respiratory: Negative for chest tightness, cough and shortness of breath.   Cardiovascular: Negative for chest pain, leg swelling and palpitations.  Gastrointestinal: Negative for abdominal distention, abdominal pain, constipation, diarrhea, nausea and vomiting.  Endocrine: Negative for hot flashes.  Skin: Negative for itching and rash.  Neurological: Negative for dizziness, extremity weakness, headaches and numbness.  Hematological: Negative for adenopathy.  Psychiatric/Behavioral: Negative for depression. The patient is not nervous/anxious.       PHYSICAL EXAMINATION  ECOG PERFORMANCE STATUS: 1 - Symptomatic but completely ambulatory  Vitals:   04/12/17 1219  BP: (!) 154/79  Pulse: 78  Resp: 20  Temp: 97.8 F (36.6 C)  SpO2: 99%    Physical Exam  Constitutional: She is oriented to person, place, and time and well-developed, well-nourished, and in no distress.  HENT:  Head: Normocephalic and atraumatic.  Mouth/Throat: Oropharynx is clear and moist. No oropharyngeal exudate.  Eyes: Pupils are equal, round, and reactive to light. No scleral icterus.  Neck: Neck supple.  Cardiovascular: Normal rate, regular rhythm and normal heart sounds.  Pulmonary/Chest: Effort normal and breath sounds normal. No respiratory distress. She has no wheezes. She has no rales.  Abdominal: Soft. Bowel sounds are normal. She exhibits no distension. There is no tenderness.  Musculoskeletal: Normal range of motion. She exhibits no edema.  Lymphadenopathy:    She has no cervical adenopathy.  Neurological: She is alert and oriented to person, place, and time.  Skin: Skin is warm and dry. No rash noted.  2cm likely cutaneous met laterally to the right of T2 on the upper back, and  smaller 1cm ill defined ? Cutaneous met to the left  Psychiatric: Mood and affect normal.    LABORATORY DATA:  CBC    Component Value Date/Time   WBC 4.0 03/02/2017 0756   WBC 6.4 06/14/2016 0413   RBC 4.41 03/02/2017 0756   RBC 3.64 (L) 06/14/2016 0413   HGB 12.1 03/02/2017 0756   HCT 37.0 03/02/2017 0756   PLT 187 03/02/2017 0756   MCV 83.9 03/02/2017 0756   MCH 27.5 03/02/2017 0756   MCH 28.6 06/14/2016 0413   MCHC 32.7 03/02/2017 0756   MCHC 33.3 06/14/2016 0413   RDW 14.4 03/02/2017 0756   LYMPHSABS 0.5 (L) 03/02/2017 0756   MONOABS 0.3 03/02/2017 0756   EOSABS 0.1 03/02/2017 0756   BASOSABS 0.0 03/02/2017 0756    CMP     Component Value Date/Time   NA 137 03/29/2017 0745   K 4.2 03/29/2017 0745   CL 103 06/14/2016 0413   CL 101 10/25/2012 0946   CO2 25 03/29/2017 0745   GLUCOSE 82 03/29/2017 0745   GLUCOSE 90 10/25/2012 0946   BUN 10.0 03/29/2017 0745   CREATININE 0.8 03/29/2017 0745   CALCIUM 9.6 03/29/2017 0745   PROT 7.1 03/02/2017 0756   ALBUMIN 3.7 03/02/2017 0756   AST 22 03/02/2017 0756   ALT 15 03/02/2017 0756   ALKPHOS 70 03/02/2017 0756   BILITOT 0.44 03/02/2017 0756   GFRNONAA >60 06/14/2016 0413   GFRAA >60 06/14/2016 0413     ASSESSMENT andPLAN:   Primary cancer of upper outer quadrant of right female breast ([place]) Right breast invasive ductal carcinoma ER positive PR negative HER-2  positive Ki-67 44% multifocal disease 3/7 lymph nodes positive T2 N1 M0 stage IIB status post adjuvant chemotherapy with TCH followed by Herceptin maintenance, adjuvant radiation therapy and Letrozole. MRI 02/14/2015: T5 large destructive lesion with pathologic compression fracture and extensive epidural tumor, involvement of T4, excision metastatic carcinoma ER 60%, PR 0%, HER-2 positive ratio 2.71 average copy #7.45 03/27/2015: T4-6 decompression surgery for spinal cord compression PET-CT 12/10/17Rt para-spinal mass mildy hypermetabolic SUV 5.9, lytic cortical  lesion 4.8 cm lesion left prox femur suv 8.8 favor osseus met disease   Treatment summary: 1. Resumed anastrozole 05/21/2015, but it has caused significant hair loss so we switched her to exemestane 25 mg once daily 07/23/2015 2. Herceptin every 3 week started 04/10/2015 3. Bone metastases: Zometa every 3 months. 4. Radiation therapy to the spine (Dr. [name]) 04/13/2015 to 05/19/2015 5. Radiation therapy to left femur 04/28/2016 to 05/12/2016  6. SRS to the brain lesion 11/24/2016 -------------------------------------------------------------------------------------------------------------------------- Goal of treatment: Palliation Treatment plan:Faslodex along with Herceptin and Perjeta first dose of Faslodex given 06/09/2016 every 4 weeks  Her last scans were in 01/2017.  There is concern for progression.  We will get PET scan within the next 1-2 weeks and depending on results likely switch to [place].  I gave her info on this today.  We also may need to biopsy this lesion as well.  This was reviewed by myself and [name] with the patient.  She verbalized understanding.  Our goal is to get the PET scan done and review with her on 12/19 or sooner.    All questions were answered. The patient knows to call the clinic with any problems, questions or concerns. We can certainly see the patient much sooner if necessary. This note was electronically signed. Scot Dock, NP 04/12/2017   Attending Note  I personally saw and examined Heather [name]. The plan of care was discussed with her. I agree with the assessment and plan as documented above. I palpated the nodules on her upper back which are very concerning for cutaneous metastases. I would like to obtain a PET/CT scan to determine the extent of her disease. We may be looking at changing her treatment to [place].  I also discussed Xeloda with lapatinib. We may also consider biopsying the subcutaneous metastases.  Signed Rulon Eisenmenger, MD

## 2017-04-12 NOTE — Progress Notes (Signed)
CARDIO-ONCOLOGY CLINIC NOTE  Patient ID: Heather [name], female   DOB: Apr 26, 1946, 71 y.o.   MRN: [id number]  Referring Oncologist: Dr [name] Oncologistt: Dr [name] PCP: Dr [name] General Surgeon: Dr Marlou Starks  HPI: Heather [name] is a 71 year old female with invasive ductal carcinoma in R breast that is ER positive and HER-2/neu positive,  S/P R mastectomy and lymph node dissection 05/10/12.    She has been treated for HTN in the past but stopped taking the medications due to elevated renal function. Intolerant Ace-I due to cough.    She received a total of 6 cycles of Taxotere/carboplatin starting on 06/14/2012 and finishing in May. XRT finished in August 2014. Now receiving Herceptin every 3 weeks to end at the end of February 2015.    She finished initial Herceptin 2015 but unfortunately then developed pathological vertebral fracture with cord compression and paralysis. Now back on Herceptin since 12/16.  Found to have metastatic lesion to left femur. Underwent XRT.   Subsequently found to have a met to her 8th rib. Perjeta added to Herceptin starting in March 2018. Also getting Faslodex injections.    Here for f/u. Continues on Herceptin and Perjeta. Doing well. Having eye and vaginal dryness from therapy. No HF symptoms. Gets dizzy if she stands up to quickly after lying down for a while. No syncope or presyncope. Worried that lump on her back is getting worse.   Echo EF 55-60% Grade I DD GLS -21.3% LS 7.4 cm/s (poor window)  SUMMARY OF ONCOLOGIC HISTORY:    Primary cancer of upper outer quadrant of right female breast ([place])   03/08/2012 Initial Diagnosis    Right breast invasive ductal carcinoma ER positive PR negative HER-2 positive Ki-67 44%; another breast mass biopsied in the anterior part of the breast which was also positive for malignancy that was HER-2 negative      05/10/2012 Surgery    Right mastectomy and axillary lymph node dissection: Multifocal disease 5 cm, 1.7  cm, 1.6 cm, ER positive PR negative HER-2 positive Ki-67 44%, 3/7 lymph nodes positive      06/14/2012 - 06/12/2013 Chemotherapy    Adjuvant chemotherapy with [place] 6 followed by Herceptin maintenance      10/25/2012 - 12/11/2012 Radiation Therapy    Adjuvant radiation therapy      02/07/2013 -  Anti-estrogen oral therapy    Letrozole 2.5 mg daily      02/14/2015 Imaging    MRI spine: Large destructive T5 lesion with severe pathologic compression fracture and extensive epidural tumor, severe spinal stenosis with moderate cord compression, tumor involvement of T4      03/18/2015 PET scan    Residual enhancing soft tissue adjacent to the spinal cord. No evidence of metastatic disease. Nonspecific uptake the left nipple      03/27/2015 - 03/30/2015 Hospital Admission    T4-6 decompression for spinal cord compression (lower extremity paralysis)      04/10/2015 -  Chemotherapy    Palliative treatment with Herceptin every 3 weeks with letrozole 2.5 mg daily      04/13/2015 - 05/19/2015 Radiation Therapy    Palliative radiation treatment to the spine      09/01/2015 Imaging    CT chest abdomen pelvis: Pathologic fracture with posterior fusion at T5 no other evidence of metastatic disease in the chest abdomen pelvis      12/23/2015 Imaging    CT CAP: new nonspecific 0.6 cm lymph node was stated mediastinum needs follow-up CT, innumerable  tiny groundglass pulmonary nodules throughout both lungs unchanged, patchy consolidation from radiation, T5 fracture, no mets      04/15/2016 PET scan    Rt para-spinal mass mildy hypermetabolic SUV 5.9, lytic cortical lesion 4.8 cm lesion left prox femur suv 8.8 favor osseus met disease      04/28/2016 - 05/12/2016 Radiation Therapy    Palliative XRT Left femur      05/24/2016 Procedure    Soft tissue mass biopsy back: Metastatic breast cancer ER 80%, PR 0%, Ki-67 50%, HER-2 positive ratio  2.25      06/09/2016 -  Anti-estrogen oral therapy    Faslodex with Herceptin and Perjeta every 4 weeks      06/13/2016 - 06/15/2016 Hospital Admission    uncomplicated revision of previous thoracic instrumentation.        03/28/12 ECHO 60-65% Lateral S' 9.3 09/17/12 ECHO 55% lateral s' 9.4 03/05/13 ECHO EF 55% lateral s' 11.1 1/15 ECHO EF 60-65%, lateral s' 11.3, global longitudinal strain -22.9% 8/15 Echo EF 60% lateral s 11.2, GLS -25% 10/07/2015  ECHO EF 55-60% laterals s' 8.7 cm/s GLS -21.1% mild MR. Grade I DD 01/07/2016 Echo EF 55-60% GLS -21.2% Lateral s' 10.2 cm/s Mild MR/TR Grade I DD  04/29/16 Echo EF 60-65% lateral s' 9.6 cm/s GLS -17.9% (but poor tracking) If use 2 images with good tracking it is (21.4%)  11/02/16 Echo EF 60-65% GLS -22.13 Lat s' 10.7  Labs (1/15): K 3.9, creatinine 1.0 Labs  (7/15) K 4.2 Cr 1.2  Past Medical History:  Diagnosis Date  . Anemia    hx of  . Anxiety    r/t updated surgery  . Breast cancer ([place])    right  . Cataract    immature-not sure of which eye  . Complication of anesthesia    pt states she is sensitive to meds  . Dizziness    has been going on for 56month;medical MD aware  . Genetic testing 07/11/2016   Test Results: Normal; No pathogenic mutations detected  Genes Analyzed: 43 genes on [employer]'s Common Cancers panel (APC, ATM, AXIN2, BARD1, BMPR1A, BRCA1, BRCA2, BRIP1, CDH1, CDKN2A, CHEK2, DICER1, EPCAM, GREM1, HOXB13, KIT, MEN1, MLH1, MSH2, MSH6, MUTYH, NBN, NF1, PALB2, PDGFRA, PMS2, POLD1, POLE, PTEN, RAD50, RAD51C, RAD51D, SDHA, SDHB, SDHC, SDHD, SMAD4, SMARCA4, STK11, TP53, TSC1, TSC2, VHL).  .Marland KitchenGERD (gastroesophageal reflux disease)    Tums prn  . H. pylori infection   . H/O hiatal hernia   . Hemorrhoids   . History of UTI   . Hx of radiation therapy 10/25/12- 12/11/12   right chest wall/regional lymph nodes 5040 cGy, 28 sessions, right chest wall boost 1000 cGy 1 session  . Hx: UTI (urinary tract infection)   .  Hyperlipidemia    but not on meds;diet and exercise controlled  . Hypertension    recently started Aldactone   . Insomnia    takes Melatoniin daily  . Metastasis to spinal column (HCC)    T5, Left  Femur cancer- radiation   . Neuropathy    "from back surgery"  . PONV (postoperative nausea and vomiting)    pt experienced hair loss, confusion and combative- 02/15/15  . Right knee pain   . Right shoulder pain   . Skin cancer    squamous, Nodule to back - "same cancer as breast."  . Urinary frequency    d/t taking Aldactone    Current Outpatient Medications  Medication Sig Dispense Refill  . ALPRAZolam (XANAX) 0.25  MG tablet Take 1 tablet by mouth as needed.    Marland Kitchen b complex vitamins tablet Take 1 tablet by mouth daily.    . Calcium Carbonate Antacid (TUMS PO) Take 2 tablets by mouth 2 (two) times daily as needed (acid reflux).    . Cholecalciferol (VITAMIN D) 2000 UNITS tablet Take 2,000 Units by mouth daily.    . Coenzyme Q10 (COQ10) 100 MG CAPS Take 100 mg by mouth as needed. Days that she takes pravastatin MWF.    Marland Kitchen fluticasone (FLONASE) 50 MCG/ACT nasal spray Place 1 spray into both nostrils daily as needed.    . gabapentin (NEURONTIN) 600 MG tablet Take 1 tablet (600 mg total) by mouth as directed. Take 1 tablet in the AM and afternoon and 1 1/2 tabs at bedtime (Patient taking differently: Take 600 mg by mouth 3 (three) times daily. Take 1 tablet in the AM and afternoon and 1 1/2 tabs at bedtime) 315 tablet 2  . HYDROcodone-acetaminophen (NORCO/VICODIN) 5-325 MG tablet     . lidocaine-prilocaine (EMLA) cream Apply to affected area once (Patient taking differently: Apply 1 application topically every 30 (thirty) days. Apply to port prior to infusions) 30 g 3  . OVER THE COUNTER MEDICATION Place 1 drop into both eyes 2 (two) times daily as needed (dry eyes). Over the counter lubricating eye drops    . pravastatin (PRAVACHOL) 10 MG tablet Take 10 mg by mouth every Monday, Wednesday, and  Friday.     . Probiotic Product (PROBIOTIC DAILY PO) Take 1 capsule by mouth daily.     . ranitidine (ZANTAC) 300 MG tablet Take 300 mg by mouth at bedtime. Reported on 06/25/2015    . spironolactone (ALDACTONE) 25 MG tablet Take 25 mg by mouth daily.     No current facility-administered medications for this encounter.      Allergies  Allergen Reactions  . Other Itching    Chlorhexadine Cloth wipes  . Acyclovir And Related   . Zanaflex [Tizanidine Hcl] Hives    Bumps  . Codeine     " loopy".   02/26/15: Also patient does not want to take oxycodone either (makes her feel like a "zombie")  . Keflex [Cephalexin]     GI Upset  . Naprosyn [Naproxen] Other (See Comments)    GI ipset   . Oxycodone Other (See Comments)    Made her feel like a "zombie" 02/25/15. Patient does not want to take oxycodone.  . Tramadol Itching  . Tussin [Guaifenesin] Other (See Comments)    Dizzy     Social History   Socioeconomic History  . Marital status: Married    Spouse name: Not on file  . Number of children: 2  . Years of education: Not on file  . Highest education level: Not on file  Social Needs  . Financial resource strain: Not on file  . Food insecurity - worry: Not on file  . Food insecurity - inability: Not on file  . Transportation needs - medical: Not on file  . Transportation needs - non-medical: Not on file  Occupational History  . Not on file  Tobacco Use  . Smoking status: Never Smoker  . Smokeless tobacco: Never Used  Substance and Sexual Activity  . Alcohol use: No  . Drug use: No  . Sexual activity: Yes    Birth control/protection: Post-menopausal  Other Topics Concern  . Not on file  Social History Narrative  . Not on file    Family History  Problem Relation Age of Onset  . Leukemia Mother 73       deceased 71  . Stroke Father   . Diabetes Mellitus II Father   . Prostate cancer Brother 74       currently 77  . Stomach cancer Maternal Grandmother 11        deceased 4  . Prostate cancer Brother 53       currently 92    PHYSICAL EXAM: Vitals:   04/12/17 1006  BP: 138/87  Pulse: 78  SpO2: 98%   General:  Sitting on table  No resp difficulty HEENT: normal conjunctiva injected Neck: supple. no JVD. Carotids 2+ bilat; no bruits. No lymphadenopathy or thryomegaly appreciated. Cor: PMI nondisplaced. Regular rate & rhythm. No rubs, gallops or murmurs. Lungs: clear  Prominent lump in R upper back Abdomen: soft, nontender, nondistended. No hepatosplenomegaly. No bruits or masses. Good bowel sounds. Extremities: no cyanosis, clubbing, rash, edema Neuro: alert & orientedx3, cranial nerves grossly intact. moves all 4 extremities w/o difficulty. Affect pleasant   ASSESSMENT & PLAN: 1. R breast CA with bone mets -initial Herceptin completed in 2015. -Palliative Herceptin start 12/16. Recently added Perjeta in 3/18 - I reviewed echos personally. EF and Doppler parameters stable. No HF on exam. Continue Herceptin/PErjeta.  - No signs of cardio-toxicity will extend screening interval to every 6 months - I have referred abck to oncology today to re-evaluate lump in back 2. HTN:  - BP improved. Having orthostatic symptoms. 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 Check orthostatics. Drink fluids  Addendum: Orthostatics ok.   Heather Short,MD 10:53 AM

## 2017-04-12 NOTE — Telephone Encounter (Signed)
PET scan appt moved up to 04/18/17 at 9 am at [facility].  Patient aware.

## 2017-04-18 ENCOUNTER — Encounter
Admission: RE | Admit: 2017-04-18 | Discharge: 2017-04-18 | Disposition: A | Discharge status: Home / Self Care | Payer: Medicare Other | Source: Ambulatory Visit | Attending: Adult Health | Admitting: Adult Health

## 2017-04-18 DIAGNOSIS — C50919 Malignant neoplasm of unspecified site of unspecified female breast: Secondary | ICD-10-CM | POA: Diagnosis not present

## 2017-04-18 DIAGNOSIS — C50911 Malignant neoplasm of unspecified site of right female breast: Secondary | ICD-10-CM | POA: Diagnosis not present

## 2017-04-18 LAB — GLUCOSE, CAPILLARY: Glucose-Capillary: 85 mg/dL (ref 65–99)

## 2017-04-18 MED ORDER — FLUDEOXYGLUCOSE F - 18 (FDG) INJECTION
12.9600 | Freq: Once | INTRAVENOUS | Status: AC | PRN
  Administered 2017-04-18: 12.96 via INTRAVENOUS

## 2017-04-19 ENCOUNTER — Other Ambulatory Visit: Payer: Self-pay | Admitting: Hematology and Oncology

## 2017-04-19 ENCOUNTER — Ambulatory Visit: Payer: Medicare Other | Admitting: Physical Therapy

## 2017-04-19 ENCOUNTER — Encounter: Payer: Self-pay | Admitting: Physical Therapy

## 2017-04-19 DIAGNOSIS — M6281 Muscle weakness (generalized): Secondary | ICD-10-CM

## 2017-04-19 DIAGNOSIS — C50911 Malignant neoplasm of unspecified site of right female breast: Secondary | ICD-10-CM

## 2017-04-19 DIAGNOSIS — M546 Pain in thoracic spine: Secondary | ICD-10-CM | POA: Diagnosis not present

## 2017-04-19 DIAGNOSIS — R269 Unspecified abnormalities of gait and mobility: Secondary | ICD-10-CM | POA: Diagnosis not present

## 2017-04-19 DIAGNOSIS — R2681 Unsteadiness on feet: Secondary | ICD-10-CM | POA: Diagnosis not present

## 2017-04-19 DIAGNOSIS — G9529 Other cord compression: Secondary | ICD-10-CM

## 2017-04-19 DIAGNOSIS — C7951 Secondary malignant neoplasm of bone: Secondary | ICD-10-CM

## 2017-04-19 MED ORDER — LIDOCAINE-PRILOCAINE 2.5-2.5 % EX CREA: TOPICAL_CREAM | CUTANEOUS | 3 refills | Status: DC

## 2017-04-19 NOTE — Therapy (Addendum)
[facility] [place], [place], [address] Phone: [phone]   Fax:  [phone]  Physical Therapy Treatment  Patient Details  Name: Heather [name] MRN: [id number] Date of Birth: [birth year] Referring Provider: Consuella Lose MD   Encounter Date: 04/19/2017  PT End of Session - 04/19/17 0858    Visit Number  13    Number of Visits  16    Date for PT Re-Evaluation  05/08/17    PT Start Time  0901    PT Stop Time  0948    PT Time Calculation (min)  47 min    Activity Tolerance  Patient tolerated treatment well    Behavior During Therapy  [facility] for tasks assessed/performed       Past Medical History:  Diagnosis Date  . Anemia    hx of  . Anxiety    r/t updated surgery  . Breast cancer ([place])    right  . Cataract    immature-not sure of which eye  . Complication of anesthesia    pt states she is sensitive to meds  . Dizziness    has been going on for 101month;medical MD aware  . Genetic testing 07/11/2016   Test Results: Normal; No pathogenic mutations detected  Genes Analyzed: 43 genes on [employer]'s Common Cancers panel (APC, ATM, AXIN2, BARD1, BMPR1A, BRCA1, BRCA2, BRIP1, CDH1, CDKN2A, CHEK2, DICER1, EPCAM, GREM1, HOXB13, KIT, MEN1, MLH1, MSH2, MSH6, MUTYH, NBN, NF1, PALB2, PDGFRA, PMS2, POLD1, POLE, PTEN, RAD50, RAD51C, RAD51D, SDHA, SDHB, SDHC, SDHD, SMAD4, SMARCA4, STK11, TP53, TSC1, TSC2, VHL).  .Marland KitchenGERD (gastroesophageal reflux disease)    Tums prn  . H. pylori infection   . H/O hiatal hernia   . Hemorrhoids   . History of UTI   . Hx of radiation therapy 10/25/12- 12/11/12   right chest wall/regional lymph nodes 5040 cGy, 28 sessions, right chest wall boost 1000 cGy 1 session  . Hx: UTI (urinary tract infection)   . Hyperlipidemia    but not on meds;diet and exercise controlled  . Hypertension    recently started Aldactone   . Insomnia    takes Melatoniin daily  . Metastasis to spinal column (HCC)    T5, Left   Femur cancer- radiation   . Neuropathy    "from back surgery"  . PONV (postoperative nausea and vomiting)    pt experienced hair loss, confusion and combative- 02/15/15  . Right knee pain   . Right shoulder pain   . Skin cancer    squamous, Nodule to back - "same cancer as breast."  . Urinary frequency    d/t taking Aldactone    Past Surgical History:  Procedure Laterality Date  . COLONOSCOPY    . cyst removed from left breast  1970  . DECOMPRESSIVE LUMBAR LAMINECTOMY LEVEL 4 N/A 02/15/2015   Procedure: DECOMPRESSION T5 AND T3-T7 STABALIZATION;  Surgeon: NConsuella Lose MD;  [facility];  Service: Neurosurgery;  Laterality: N/A;  . ESOPHAGOGASTRODUODENOSCOPY    . LAMINECTOMY N/A 03/27/2015   Procedure: Thoracic Four-Thoracic Six Laminectomy for tumor;  Surgeon: NConsuella Lose MD;  [facility];  Service: Neurosurgery;  Laterality: N/A;  T4-T6 Laminectomy  . left wrist surgery   2004   with plate  . MASTECTOMY MODIFIED RADICAL  05/10/2012   Procedure: MASTECTOMY MODIFIED RADICAL;  Surgeon: PMerrie Roof MD;  [facility]  Service: General;  Laterality: Right;  RIGHT MODIFIED RADICAL MASTECTOMY  . MASTECTOMY, RADICAL  Right   . MINOR BREAST BIOPSY Left 05/16/2016   Procedure: EXCISION OF BACK NODULE;  Surgeon: Autumn Messing III, MD;  Location: [place] [facility];  Service: General;  Laterality: Left;  . Nodule  back  05/17/2015  . PORTACATH PLACEMENT  05/10/2012   Procedure: INSERTION PORT-A-CATH;  Surgeon: Merrie Roof, MD;  Location: [facility];  Service: General;  Laterality: Left;    There were no vitals filed for this visit.  Subjective Assessment - 04/19/17 0857    Subjective  Reports that she rode her bike a few days and then was very sore so she hasn't been on bike since. Reports dizziness at times that was reported to MDs. Reports that she may have several appointments related to cancer treatment and may not be able to make PT.    Pertinent  History  Cancer (still active).  Neuropathy.      Patient Stated Goals  Get stronger.    Currently in Pain?  Other (Comment) No pain assessment provided by patient         [facility] PT Assessment - 04/19/17 0001      Assessment   Medical Diagnosis  Thoracic radiculopathy.    Next MD Visit  06/2017      Precautions   Precautions  Fall      Restrictions   Weight Bearing Restrictions  No                  [facility] Adult PT Treatment/Exercise - 04/19/17 0001      Knee/Hip Exercises: Aerobic   Nustep  L5 x15 min          Balance Exercises - 04/19/17 1001      Balance Exercises: Standing   Tandem Stance  Eyes open;Foam/compliant surface x4 min    SLS  Eyes open;Foam/compliant surface;Intermittent upper extremity support;1 rep x max hold; balance pods BLE x multiple reps    Rockerboard  Anterior/posterior;Lateral;EO;Intermittent UE support x3 min each    Heel Raises Limitations  x20 reps    Toe Raise Limitations  x20 reps    Other Standing Exercises  DLS on BOSU with intermittant UE support x5 min          PT Short Term Goals - 12/22/16 1808      PT SHORT TERM GOAL #1   Title  STG's=LTG's.        PT Long Term Goals - 02/28/17 1001      PT LONG TERM GOAL #1   Title  Independent with a HEP.    Time  8    Period  Weeks    Status  Achieved      PT LONG TERM GOAL #2   Title  Increase thoracic strength to a solid 5/5.    Time  8    Period  Weeks    Status  On-going      PT LONG TERM GOAL #3   Title  Negative Romberg test.    Time  8    Period  Weeks    Status  On-going      PT LONG TERM GOAL #4   Title  Improve Berg score to 49-50/56.    Time  8    Period  Weeks    Status  On-going            Plan - 04/19/17 1003    Clinical Impression Statement  Patient tolerated today's treatment fairly well but may be restricted due to  future treatments in the next few weeks. Patient more unstable in SLS and on uneven surfaces today and required  intermittant UE support throughout treatment.    Rehab Potential  Good    PT Frequency  2x / week    PT Duration  8 weeks    PT Treatment/Interventions  ADLs/Self Care Home Management;Gait training;Functional mobility training;Therapeutic activities;Therapeutic exercise;Balance training;Neuromuscular re-education    PT Next Visit Plan  Continue with standing hip strengthening/balance exercises as well as posture strengthening per MPT POC.    PT Home Exercise Plan  HEP- standing hip flexion, abduction, chest stretch, heel/toe raise    Consulted and Agree with Plan of Care  Patient       Patient will benefit from skilled therapeutic intervention in order to improve the following deficits and impairments:  Abnormal gait, Decreased activity tolerance, Decreased mobility, Decreased strength, Impaired sensation, Pain, Postural dysfunction  Visit Diagnosis: Pain in thoracic spine  Unsteadiness on feet  Muscle weakness (generalized)  Abnormality of gait     Problem List Patient Active Problem List   Diagnosis Date Noted  . Brain metastasis ([place]) 11/18/2016  . Genetic testing 07/11/2016  . ALLERGY TO CHLORHEXADINE, USE BETADINE ONLY 06/23/2016  . Kyphosis of thoracic region 06/13/2016  . Plantar fasciitis, bilateral 04/20/2016  . Neuropathic pain 01/27/2016  . Anemia of chronic disease 05/21/2015  . Metastatic cancer to spine ([place]) 03/27/2015  . Thrombocytopenia ([place]) 03/12/2015  . Prerenal azotemia 03/12/2015  . Pain   . Recurrent UTI--due to Klebsiella   . Paraplegia at T4 level ([place]) 02/20/2015  . Neurogenic bowel 02/20/2015  . Neurogenic bladder 02/20/2015  . Postoperative anemia due to acute blood loss 02/17/2015  . Spinal cord compression due to malignant neoplasm metastatic to spine ([place]) 02/15/2015  . Epidural mass 02/15/2015  . Thoracic spine tumor   . Pathologic fracture of vertebra 02/14/2015  . Pathologic fracture of thoracic vertebrae 02/14/2015  . Breast cancer  metastasized to bone, right ([place]) 02/14/2015  . Hyperlipidemia 02/14/2015  . H. pylori infection   . HTN (hypertension) 04/17/2012  . Primary cancer of upper outer quadrant of right female breast ([place]) 03/08/2012    Standley Brooking, PTA 04/19/2017, 10:19 AM  [facility] [place], [place], [address] Phone: [phone]   Fax:  [phone]  Name: PALMIRA STICKLE MRN: [id number] Date of Birth: 1945/12/15  PHYSICAL THERAPY DISCHARGE SUMMARY  Visits from Start of Care: 13.  Current functional level related to goals / functional outcomes: See above.   Remaining deficits: Continue balance problems.  Assistive device required for safety.   Education / Equipment: HEP. Plan: Patient agrees to discharge.  Patient goals were partially met. Patient is being discharged due to not returning since the last visit.  ?????         Mali Applegate MPT

## 2017-04-19 NOTE — Progress Notes (Signed)
DISCONTINUE OFF PATHWAY REGIMEN - Breast   Custom Intervention:Medical: [Trastuzumab, Pertuzumab]:     Trastuzumab        Dose Mod: None     Pertuzumab        Dose Mod: None  **Always confirm dose/schedule in your pharmacy ordering system**    REASON: Disease Progression PRIOR TREATMENT: Off Pathway: Medical: [Trastuzumab, Pertuzumab] TREATMENT RESPONSE: Stable Disease (SD)  START ON PATHWAY REGIMEN - Breast     A cycle is every 21 days:     Ado-trastuzumab emtansine   **Always confirm dose/schedule in your pharmacy ordering system**    Patient Characteristics: Metastatic Chemotherapy, HER2 Positive, ER Positive, Second Line Therapeutic Status: Distant Metastases BRCA Mutation Status: Absent ER Status: Positive (+) HER2 Status: Positive (+) Would you be surprised if this patient died  in the next year<= I would be surprised if this patient died in the next year PR Status: Negative (-) Line of therapy: Second Line Intent of Therapy: Non-Curative / Palliative Intent, Discussed with Patient

## 2017-04-19 NOTE — Progress Notes (Signed)
OFF PATHWAY REGIMEN - Breast  No Change  Continue With Treatment as Ordered.   Custom Intervention:Medical: [Trastuzumab, Pertuzumab]:     Trastuzumab        Dose Mod: None     Pertuzumab        Dose Mod: None  **Always confirm dose/schedule in your pharmacy ordering system**    Patient Characteristics: Metastatic Chemotherapy, HER2/neu Positive, ER +, Third Line AJCC Stage Grouping: IV Current Disease Status: Distant Metastases AJCC M Stage: X ER Status: Positive (+) AJCC N Stage: X AJCC T Stage: X HER2/neu: Positive (+) PR Status: Negative (-) Line of therapy: Third Line Would you be surprised if this patient died  in the next year<= I would be surprised if this patient died in the next year Intent of Therapy: Non-Curative / Palliative Intent, Discussed with Patient

## 2017-04-21 ENCOUNTER — Telehealth: Payer: Self-pay | Admitting: Adult Health

## 2017-04-21 ENCOUNTER — Telehealth: Payer: Self-pay

## 2017-04-21 NOTE — Telephone Encounter (Signed)
Called pt to let her know that we are aware that she is waiting for a call from MD or Mendel Ryder to discuss pet scan results. Told pt that she will be contacted by the end of business day today.   Pt wants to know if she needs a biopsy of her back and if it can be done prior to her next appt with Dr.Gudena. Pt also wants to know regarding her change of treatment if needed based on pet scan results.Told pt will relay all this concerns to MD and address them in their conversation.

## 2017-04-21 NOTE — Telephone Encounter (Signed)
I called and spoke with patient about her PET scan results.  She does note that she did have a URI a couple of weeks ago, and that could questionably be related to this right lower lobe airspace opacity.  She denies continued cough, fevers, or chills.  She had many questions about treatment and a change in her treatment.  I told her the working plan as far as I know is to continue with [place] and change to Kadcyla on 12/19.  She has read the handout given to her about the Kadcyla and she and I discussed it in detail.  She and I spent about 15 minutes on the phone.    Wilber Bihari, NP

## 2017-04-21 NOTE — Telephone Encounter (Signed)
Pt called for results PET scan done 04/18/17

## 2017-04-21 NOTE — Telephone Encounter (Signed)
Will have MD review results and contact pt. Thank you.

## 2017-04-25 NOTE — Assessment & Plan Note (Signed)
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 MRI 02/14/2015: T5 large destructive lesion with pathologic compression fracture and extensive epidural tumor, involvement of T4, excision metastatic carcinoma ER 60%, PR 0%, HER-2 positive ratio 2.71 average copy #7.45 03/27/2015: T4-6 decompression surgery for spinal cord compression PET-CT 12/10/17Rt para-spinal mass mildy hypermetabolic SUV 5.9, lytic cortical lesion 4.8 cm lesion left prox femur suv 8.8 favor osseus met disease   Treatment summary: 1. Resumed anastrozole 05/21/2015, but it has caused significant hair loss so we switched her to exemestane 25 mg once daily 07/23/2015 2. Herceptin every 3 week started 04/10/2015 3. Bone metastases: Zometa every 3 months. 4. Radiation therapy to the spine (Dr. [name]) 04/13/2015 to 05/19/2015 5. Radiation therapy to left femur 04/28/2016 to 05/12/2016  6. SRS to the brain lesion 11/24/2016 -------------------------------------------------------------------------------------------------------------------------- Goal of treatment: Palliation Treatment plan:Faslodex along with Herceptin and Perjeta first dose of Faslodex given 06/09/2016 every 4 weeks  Her last scans were in 01/2017.  There is concern for progression.  We will get PET scan within the next 1-2 weeks and depending on results likely switch to Kadcyla.    PET/CT 04/18/17: ill-defined rounded airspace opacity in posterior right lower lobe. Differential diagnosis includes malignancy and infectious/inflammatory process. Increased hypermetabolic lytic lesion in T8 vertebral body, consistent with bone metastasis.  Increased hypermetabolic posterior upper chest wall subcutaneous nodules, highly suspicious metastatic  disease.  Further improvement in right posterior eighth rib metastasis.

## 2017-04-26 ENCOUNTER — Ambulatory Visit (HOSPITAL_BASED_OUTPATIENT_CLINIC_OR_DEPARTMENT_OTHER): Payer: Medicare Other | Admitting: Hematology and Oncology

## 2017-04-26 ENCOUNTER — Other Ambulatory Visit: Payer: Self-pay | Admitting: Hematology and Oncology

## 2017-04-26 ENCOUNTER — Ambulatory Visit: Payer: Medicare Other

## 2017-04-26 ENCOUNTER — Telehealth: Payer: Self-pay | Admitting: Hematology and Oncology

## 2017-04-26 ENCOUNTER — Other Ambulatory Visit (HOSPITAL_BASED_OUTPATIENT_CLINIC_OR_DEPARTMENT_OTHER): Payer: Medicare Other

## 2017-04-26 ENCOUNTER — Ambulatory Visit (HOSPITAL_BASED_OUTPATIENT_CLINIC_OR_DEPARTMENT_OTHER): Payer: Medicare Other

## 2017-04-26 DIAGNOSIS — C50411 Malignant neoplasm of upper-outer quadrant of right female breast: Secondary | ICD-10-CM

## 2017-04-26 DIAGNOSIS — G9529 Other cord compression: Secondary | ICD-10-CM

## 2017-04-26 DIAGNOSIS — C7951 Secondary malignant neoplasm of bone: Secondary | ICD-10-CM

## 2017-04-26 DIAGNOSIS — Z452 Encounter for adjustment and management of vascular access device: Secondary | ICD-10-CM | POA: Diagnosis not present

## 2017-04-26 DIAGNOSIS — C50911 Malignant neoplasm of unspecified site of right female breast: Secondary | ICD-10-CM

## 2017-04-26 DIAGNOSIS — C773 Secondary and unspecified malignant neoplasm of axilla and upper limb lymph nodes: Secondary | ICD-10-CM

## 2017-04-26 DIAGNOSIS — Z170 Estrogen receptor positive status [ER+]: Secondary | ICD-10-CM | POA: Diagnosis not present

## 2017-04-26 LAB — COMPREHENSIVE METABOLIC PANEL
ALT: 14 U/L (ref 0–55)
AST: 22 U/L (ref 5–34)
Albumin: 3.8 g/dL (ref 3.5–5.0)
Alkaline Phosphatase: 73 U/L (ref 40–150)
Anion Gap: 8 mEq/L (ref 3–11)
BUN: 12.4 mg/dL (ref 7.0–26.0)
CO2: 27 mEq/L (ref 22–29)
Calcium: 9.9 mg/dL (ref 8.4–10.4)
Chloride: 102 mEq/L (ref 98–109)
Creatinine: 0.8 mg/dL (ref 0.6–1.1)
EGFR: >60 mL/min/{1.73_m2} (ref >60)
Glucose: 87 mg/dl (ref 70–140)
Potassium: 3.8 mEq/L (ref 3.5–5.1)
Sodium: 137 mEq/L (ref 136–145)
Total Bilirubin: 0.46 mg/dL (ref 0.20–1.20)
Total Protein: 7.1 g/dL (ref 6.4–8.3)

## 2017-04-26 LAB — CBC WITH DIFFERENTIAL/PLATELET
BASO%: 0.6 % (ref 0.0–2.0)
Basophils Absolute: 0 10*3/uL (ref 0.0–0.1)
EOS%: 1.4 % (ref 0.0–7.0)
Eosinophils Absolute: 0.1 10*3/uL (ref 0.0–0.5)
HCT: 36.6 % (ref 34.8–46.6)
HGB: 12 g/dL (ref 11.6–15.9)
LYMPH%: 10.9 % — ABNORMAL LOW (ref 14.0–49.7)
MCH: 27.2 pg (ref 25.1–34.0)
MCHC: 32.6 g/dL (ref 31.5–36.0)
MCV: 83.2 fL (ref 79.5–101.0)
MONO#: 0.4 10*3/uL (ref 0.1–0.9)
MONO%: 7 % (ref 0.0–14.0)
NEUT#: 4.5 10*3/uL (ref 1.5–6.5)
NEUT%: 80.1 % — ABNORMAL HIGH (ref 38.4–76.8)
Platelets: 201 10*3/uL (ref 145–400)
RBC: 4.4 10*6/uL (ref 3.70–5.45)
RDW: 14.9 % — ABNORMAL HIGH (ref 11.2–14.5)
WBC: 5.6 10*3/uL (ref 3.9–10.3)
lymph#: 0.6 10*3/uL — ABNORMAL LOW (ref 0.9–3.3)

## 2017-04-26 MED ORDER — SODIUM CHLORIDE 0.9 % IJ SOLN
10.0000 mL | INTRAMUSCULAR | Status: DC | PRN
  Administered 2017-04-26: 10 mL via INTRAVENOUS
  Filled 2017-04-26: qty 10

## 2017-04-26 NOTE — Telephone Encounter (Signed)
Gave patient AVs and calendar of upcoming December and January appointments.

## 2017-04-26 NOTE — Progress Notes (Signed)
Patient Care Team: Heather Stains, MD as PCP - General (Family Medicine)  DIAGNOSIS:  Encounter Diagnosis  Name Primary?  . Primary cancer of upper outer quadrant of right female breast (Heather [name])     SUMMARY OF ONCOLOGIC HISTORY:   Primary cancer of upper outer quadrant of right female breast (Heather [name])   03/08/2012 Initial Diagnosis    Right breast invasive ductal carcinoma ER positive PR negative HER-2 positive Ki-67 44%; another breast mass biopsied in the anterior part of the breast which was also positive for malignancy that was HER-2 negative      05/10/2012 Surgery    Right mastectomy and axillary lymph node dissection: Multifocal disease 5 cm, 1.7 cm, 1.6 cm, ER positive PR negative HER-2 positive Ki-67 44%, 3/7 lymph nodes positive      06/14/2012 - 06/12/2013 Chemotherapy    Adjuvant chemotherapy with Heather [name] 6 followed by Herceptin maintenance      10/25/2012 - 12/11/2012 Radiation Therapy    Adjuvant radiation therapy      02/07/2013 -  Anti-estrogen oral therapy    Letrozole 2.5 mg daily      02/14/2015 Imaging    MRI spine: Large destructive T5 lesion with severe pathologic compression fracture and extensive epidural tumor, severe spinal stenosis with moderate cord compression, tumor involvement of T4      03/18/2015 PET scan    Residual enhancing soft tissue adjacent to the spinal cord. No evidence of metastatic disease. Nonspecific uptake the left nipple      03/27/2015 - 03/30/2015 Hospital Admission    T4-6 decompression for spinal cord compression (lower extremity paralysis)      04/10/2015 -  Chemotherapy    Palliative treatment with Herceptin every 3 weeks with letrozole 2.5 mg daily      04/13/2015 - 05/19/2015 Radiation Therapy    Palliative radiation treatment to the spine      09/01/2015 Imaging    CT chest abdomen pelvis: Pathologic fracture with posterior fusion at T5 no other evidence of metastatic disease in the chest abdomen pelvis      12/23/2015 Imaging      CT CAP: new nonspecific 0.6 cm lymph node was stated mediastinum needs follow-up CT, innumerable tiny groundglass pulmonary nodules throughout both lungs unchanged, patchy consolidation from radiation, T5 fracture, no mets      04/15/2016 PET scan    Rt para-spinal mass mildy hypermetabolic SUV 5.9, lytic cortical lesion 4.8 cm lesion left prox femur suv 8.8 favor osseus met disease      04/28/2016 - 05/12/2016 Radiation Therapy    Palliative XRT Left femur      05/24/2016 Procedure    Soft tissue mass biopsy back: Metastatic breast cancer ER 80%, PR 0%, Ki-67 50%, HER-2 positive ratio 2.25      06/09/2016 -  Anti-estrogen oral therapy    Faslodex with Herceptin and Perjeta every 4 weeks      06/13/2016 - 06/15/2016 Hospital Admission    uncomplicated revision of previous thoracic instrumentation.      10/10/2016 PET scan    Interval development of new hypermetabolic 2.5 x 1.1 cm lesion posterior right eighth rib consistent with metastatic disease, stable right paraspinal lesion at T8, clustered soft tissue nodules in the posterior midline back near cervicothoracic junction smaller than before       11/03/2016 - 11/07/2016 Radiation Therapy    Palliative radiation to right eighth rib      11/04/2016 Imaging    Solitary contrast-enhancing lesion in the right  cerebellum 6 x 2 mm concerning for metastatic lesion      11/24/2016 - 11/24/2016 Radiation Therapy    SRS to the brain lesion      04/18/2017 PET scan    New ill-defined rounded airspace opacity in posterior right lower lobe. Differential diagnosis includes malignancy and infectious/inflammatory process.Increased hypermetabolic lytic lesion in T8 vertebral body, consistent with bone metastasis.  Increased hypermetabolic posterior upper chest wall subcutaneous nodules, highly suspicious metastatic disease. Further improvement in right posterior eighth rib metastasis.       CHIEF COMPLIANT: Patient is here to review the PET/CT  scan  INTERVAL HISTORY: Heather [name] is a 71 year old with above-mentioned history metastatic breast cancer HER-2 positive disease who is here to receive Herceptin and Perjeta along with Faslodex.  She underwent a recent PET/CT scan is here to discuss those results.  She is complaining of worsening mid back pain.  PET/CT scan showed a worsening metastatic disease at T8 vertebral body.  There are also additional subcutaneous nodules in the upper back.  These are going on systemic therapy with the Herceptin Perjeta along with Faslodex.  REVIEW OF SYSTEMS:   Constitutional: Denies fevers, chills or abnormal weight loss Eyes: Denies blurriness of vision Ears, nose, mouth, throat, and face: Denies mucositis or sore throat Respiratory: Denies cough, dyspnea or wheezes Cardiovascular: Denies palpitation, chest discomfort Gastrointestinal:  Denies nausea, heartburn or change in bowel habits Skin: Denies abnormal skin rashes Lymphatics: Denies new lymphadenopathy or easy bruising Neurological:Denies numbness, tingling or new weaknesses, mid back pain Behavioral/Psych: Mood is stable, no new changes  Extremities: No lower extremity edema  All other systems were reviewed with the patient and are negative.  I have reviewed the past medical history, past surgical history, social history and family history with the patient and they are unchanged from previous note.  ALLERGIES:  is allergic to other; acyclovir and related; zanaflex [tizanidine hcl]; codeine; keflex [cephalexin]; naprosyn [naproxen]; oxycodone; tramadol; and tussin [guaifenesin].  MEDICATIONS:  Current Outpatient Medications  Medication Sig Dispense Refill  . ALPRAZolam (XANAX) 0.25 MG tablet Take 1 tablet by mouth as needed.    Marland Kitchen b complex vitamins tablet Take 1 tablet by mouth daily.    . Calcium Carbonate Antacid (TUMS PO) Take 2 tablets by mouth 2 (two) times daily as needed (acid reflux).    . Cholecalciferol (VITAMIN D) 2000  UNITS tablet Take 2,000 Units by mouth daily.    . Coenzyme Q10 (COQ10) 100 MG CAPS Take 100 mg by mouth as needed. Days that she takes pravastatin MWF.    Marland Kitchen fluticasone (FLONASE) 50 MCG/ACT nasal spray Place 1 spray into both nostrils daily as needed.    . gabapentin (NEURONTIN) 600 MG tablet Take 1 tablet (600 mg total) by mouth as directed. Take 1 tablet in the AM and afternoon and 1 1/2 tabs at bedtime (Patient taking differently: Take 600 mg by mouth 3 (three) times daily. Take 1 tablet in the AM and afternoon and 1 1/2 tabs at bedtime) 315 tablet 2  . HYDROcodone-acetaminophen (NORCO/VICODIN) 5-325 MG tablet     . lidocaine-prilocaine (EMLA) cream Apply to affected area once (Patient taking differently: Apply 1 application topically every 30 (thirty) days. Apply to port prior to infusions) 30 g 3  . lidocaine-prilocaine (EMLA) cream Apply to affected area once 30 g 3  . OVER THE COUNTER MEDICATION Place 1 drop into both eyes 2 (two) times daily as needed (dry eyes). Over the counter lubricating eye drops    .  pravastatin (PRAVACHOL) 10 MG tablet Take 10 mg by mouth every Monday, Wednesday, and Friday.     . Probiotic Product (PROBIOTIC DAILY PO) Take 1 capsule by mouth daily.     . ranitidine (ZANTAC) 300 MG tablet Take 300 mg by mouth at bedtime. Reported on 06/25/2015    . spironolactone (ALDACTONE) 25 MG tablet Take 25 mg by mouth daily.     No current facility-administered medications for this visit.    Facility-Administered Medications Ordered in Other Visits  Medication Dose Route Frequency Provider Last Rate Last Dose  . sodium chloride 0.9 % injection 10 mL  10 mL Intravenous PRN [name], MD   10 mL at 04/26/17 1245    PHYSICAL EXAMINATION: ECOG PERFORMANCE STATUS: 1 - Symptomatic but completely ambulatory  Vitals:   04/26/17 0900  BP: (!) 152/75  Pulse: 76  Resp: 18  Temp: (!) 97.5 F (36.4 C)  SpO2: 100%   Filed Weights   04/26/17 0900  Weight: 132 lb 9.6 oz  (60.1 kg)    GENERAL:alert, no distress and comfortable SKIN: Subcutaneous nodules on the upper back area EYES: normal, Conjunctiva are pink and non-injected, sclera clear OROPHARYNX:no exudate, no erythema and lips, buccal mucosa, and tongue normal  NECK: supple, thyroid normal size, non-tender, without nodularity LYMPH:  no palpable lymphadenopathy in the cervical, axillary or inguinal LUNGS: clear to auscultation and percussion with normal breathing effort HEART: regular rate & rhythm and no murmurs and no lower extremity edema ABDOMEN:abdomen soft, non-tender and normal bowel sounds MUSCULOSKELETAL:no cyanosis of digits and no clubbing  NEURO: alert & oriented x 3 with fluent speech, no focal motor/sensory deficits EXTREMITIES: No lower extremity edema   LABORATORY DATA:  I have reviewed the data as listed   Chemistry      Component Value Date/Time   NA 137 04/26/2017 0819   K 3.8 04/26/2017 0819   CL 103 06/14/2016 0413   CL 101 10/25/2012 0946   CO2 27 04/26/2017 0819   BUN 12.4 04/26/2017 0819   CREATININE 0.8 04/26/2017 0819      Component Value Date/Time   CALCIUM 9.9 04/26/2017 0819   ALKPHOS 73 04/26/2017 0819   AST 22 04/26/2017 0819   ALT 14 04/26/2017 0819   BILITOT 0.46 04/26/2017 0819       Lab Results  Component Value Date   WBC 5.6 04/26/2017   HGB 12.0 04/26/2017   HCT 36.6 04/26/2017   MCV 83.2 04/26/2017   PLT 201 04/26/2017   NEUTROABS 4.5 04/26/2017    ASSESSMENT & PLAN:  Primary cancer of upper outer quadrant of right female breast ([facility]) Right breast invasive ductal carcinoma ER positive PR negative HER-2 positive Ki-67 44% multifocal disease 3/7 lymph nodes positive T2 N1 M0 stage IIB status post adjuvant chemotherapy with TCH followed by Herceptin maintenance, adjuvant radiation therapy and Letrozole. MRI 02/14/2015: T5 large destructive lesion with pathologic compression fracture and extensive epidural tumor, involvement of T4, excision  metastatic carcinoma ER 60%, PR 0%, HER-2 positive ratio 2.71 average copy #7.45 03/27/2015: T4-6 decompression surgery for spinal cord compression PET-CT 12/10/17Rt para-spinal mass mildy hypermetabolic SUV 5.9, lytic cortical lesion 4.8 cm lesion left prox femur suv 8.8 favor osseus met disease   Treatment summary: 1. Resumed anastrozole 05/21/2015, but it has caused significant hair loss so we switched her to exemestane 25 mg once daily 07/23/2015 2. Herceptin every 3 week started 04/10/2015 3. Bone metastases: Zometa every 3 months. 4. Radiation therapy to the spine (  Dr. [name]) 04/13/2015 to 05/19/2015 5. Radiation therapy to left femur 04/28/2016 to 05/12/2016  6. SRS to the brain lesion 11/24/2016 -------------------------------------------------------------------------------------------------------------------------- Goal of treatment: Palliation Treatment plan:Faslodex along with Herceptin and Perjeta first dose of Faslodex given 06/09/2016 every 4 weeks  PET/CT 04/18/17: ill-defined rounded airspace opacity in posterior right lower lobe. Differential diagnosis includes malignancy and infectious/inflammatory process. Increased hypermetabolic lytic lesion in T8 vertebral body, consistent with bone metastasis. Increased hypermetabolic posterior upper chest wall subcutaneous nodules, highly suspicious metastatic disease. Further improvement in right posterior eighth rib metastasis.  Based on these reports are recommended switching her to [facility]. T8 vertebral body lesion: I will discuss with radiation oncology whether she needs palliative radiation to T8.  Return to clinic next week to initiate systemic therapy with Kadcyla We will discontinue Faslodex along with Herceptin and Perjeta. I spent 25 minutes talking to the patient of which more than half was spent in counseling and coordination of care.  No orders of the defined types were placed in this encounter.  The patient has  a good understanding of the overall plan. she agrees with it. she will call with any problems that may develop before the next visit here.   Rulon Eisenmenger, MD 04/26/17

## 2017-04-26 NOTE — Patient Instructions (Signed)
Implanted Port Home Guide An implanted port is a type of central line that is placed under the skin. Central lines are used to provide IV access when treatment or nutrition needs to be given through a person's veins. Implanted ports are used for long-term IV access. An implanted port may be placed because:  You need IV medicine that would be irritating to the small veins in your hands or arms.  You need long-term IV medicines, such as antibiotics.  You need IV nutrition for a long period.  You need frequent blood draws for lab tests.  You need dialysis.  Implanted ports are usually placed in the chest area, but they can also be placed in the upper arm, the abdomen, or the leg. An implanted port has two main parts:  Reservoir. The reservoir is round and will appear as a small, raised area under your skin. The reservoir is the part where a needle is inserted to give medicines or draw blood.  Catheter. The catheter is a thin, flexible tube that extends from the reservoir. The catheter is placed into a large vein. Medicine that is inserted into the reservoir goes into the catheter and then into the vein.  How will I care for my incision site? Do not get the incision site wet. Bathe or shower as directed by your health care provider. How is my port accessed? Special steps must be taken to access the port:  Before the port is accessed, a numbing cream can be placed on the skin. This helps numb the skin over the port site.  Your health care provider uses a sterile technique to access the port. ? Your health care provider must put on a mask and sterile gloves. ? The skin over your port is cleaned carefully with an antiseptic and allowed to dry. ? The port is gently pinched between sterile gloves, and a needle is inserted into the port.  Only "non-coring" port needles should be used to access the port. Once the port is accessed, a blood return should be checked. This helps ensure that the port  is in the vein and is not clogged.  If your port needs to remain accessed for a constant infusion, a clear (transparent) bandage will be placed over the needle site. The bandage and needle will need to be changed every week, or as directed by your health care provider.  Keep the bandage covering the needle clean and dry. Do not get it wet. Follow your health care provider's instructions on how to take a shower or bath while the port is accessed.  If your port does not need to stay accessed, no bandage is needed over the port.  What is flushing? Flushing helps keep the port from getting clogged. Follow your health care provider's instructions on how and when to flush the port. Ports are usually flushed with saline solution or a medicine called heparin. The need for flushing will depend on how the port is used.  If the port is used for intermittent medicines or blood draws, the port will need to be flushed: ? After medicines have been given. ? After blood has been drawn. ? As part of routine maintenance.  If a constant infusion is running, the port may not need to be flushed.  How long will my port stay implanted? The port can stay in for as long as your health care provider thinks it is needed. When it is time for the port to come out, surgery will be   done to remove it. The procedure is similar to the one performed when the port was put in. When should I seek immediate medical care? When you have an implanted port, you should seek immediate medical care if:  You notice a bad smell coming from the incision site.  You have swelling, redness, or drainage at the incision site.  You have more swelling or pain at the port site or the surrounding area.  You have a fever that is not controlled with medicine.  This information is not intended to replace advice given to you by your health care provider. Make sure you discuss any questions you have with your health care provider. Document  Released: 04/25/2005 Document Revised: 10/01/2015 Document Reviewed: 12/31/2012 Else[employer] Interactive Patient Education  2017 [employer]  

## 2017-04-28 ENCOUNTER — Ambulatory Visit (HOSPITAL_COMMUNITY): Payer: Medicare Other

## 2017-05-04 NOTE — Progress Notes (Addendum)
Histology and Location of Primary Cancer: Right breast invasive ductal carcinoma ER positive PR negative HER-2 positive Ki-67 44%; another breast mass biopsied in the anterior part of the breast which was also positive for malignancy that was HER-2 negative. Reports she presented to [facility] on 04/12/17 for evaluation of new palpable lesions at T3 and a PET was performed.  Sites of Visceral and Bony Metastatic Disease: lymph node, spine, brain, bone, and subcutaneous nodules in upper back.  Location(s) of Symptomatic Metastases: T8  Past/Anticipated chemotherapy by medical oncology, if any:  Treatment summary: 1. Resumed anastrozole 05/21/2015, but it has caused significant hair loss so we switched her to exemestane 25 mg once daily 07/23/2015 2. Herceptin every 3 week started 04/10/2015 3. Bone metastases: Zometa every 3 months. 4. Radiation therapy to the spine (Dr. [name]) 04/13/2015 to 05/19/2015 5. Radiation therapy to left femur 04/28/2016 to 05/12/2016  6. SRS to the brain lesion 11/24/2016 -------------------------------------------------------------------------------------------------------------------------- Goal of treatment: Palliation Treatment plan:Faslodex along with Herceptin and Perjeta first dose of Faslodex given 06/09/2016 every 4 weeks. Due to disease progression patient was switched to Kadcyla. Faslodex will be discontinued along with Herceptin and Perjeta. Patient will received Kadcyla every three weeks. First dose of Kadcyla was received last Friday.     Pain on a scale of 0-10 is: Reports spine pain 4 on a scale of 0-10. Reports spine pain today 2 or 3 on a scale of 0-10. States, "I believe I forgot to take my gabapentin yesterday." Managed with gabapentin 600 mg tid. Reports rib pain has greatly improved s/p radiation therapy. Reports new posterior right side pain and questions if it is related to T8 lesion.   If Spine Met(s), symptoms, if any,  include:  Bowel/Bladder retention or incontinence (please describe): no  Numbness or weakness in extremities (please describe): lower extremity peripheral neuropathy worse on left. Since initial dose of Kadcyla on Friday she has neuropathy in her hands  Current Decadron regimen, if applicable: no  Ambulatory status? Walker? Wheelchair?: Ambulatory  SAFETY ISSUES:  Prior radiation? yes  Pacemaker/ICD? no  Possible current pregnancy? no  Is the patient on methotrexate? no  Current Complaints / other details:  71 year old female. Married. Concerned about "spot found in lung." Denies cough or shortness of breath.

## 2017-05-05 ENCOUNTER — Other Ambulatory Visit (HOSPITAL_BASED_OUTPATIENT_CLINIC_OR_DEPARTMENT_OTHER): Payer: Medicare Other

## 2017-05-05 ENCOUNTER — Ambulatory Visit (HOSPITAL_BASED_OUTPATIENT_CLINIC_OR_DEPARTMENT_OTHER): Payer: Medicare Other

## 2017-05-05 VITALS — BP 140/63 | HR 66 | Temp 97.8°F | Resp 16

## 2017-05-05 DIAGNOSIS — C7951 Secondary malignant neoplasm of bone: Secondary | ICD-10-CM

## 2017-05-05 DIAGNOSIS — C50911 Malignant neoplasm of unspecified site of right female breast: Secondary | ICD-10-CM

## 2017-05-05 DIAGNOSIS — G9529 Other cord compression: Secondary | ICD-10-CM

## 2017-05-05 DIAGNOSIS — Z5112 Encounter for antineoplastic immunotherapy: Secondary | ICD-10-CM

## 2017-05-05 DIAGNOSIS — C50411 Malignant neoplasm of upper-outer quadrant of right female breast: Secondary | ICD-10-CM | POA: Diagnosis not present

## 2017-05-05 LAB — CBC WITH DIFFERENTIAL/PLATELET
BASO%: 1.2 % (ref 0.0–2.0)
Basophils Absolute: 0.1 10*3/uL (ref 0.0–0.1)
EOS%: 1.8 % (ref 0.0–7.0)
Eosinophils Absolute: 0.1 10*3/uL (ref 0.0–0.5)
HCT: 36.9 % (ref 34.8–46.6)
HGB: 12 g/dL (ref 11.6–15.9)
LYMPH%: 13.9 % — ABNORMAL LOW (ref 14.0–49.7)
MCH: 27.1 pg (ref 25.1–34.0)
MCHC: 32.6 g/dL (ref 31.5–36.0)
MCV: 83.1 fL (ref 79.5–101.0)
MONO#: 0.3 10*3/uL (ref 0.1–0.9)
MONO%: 7 % (ref 0.0–14.0)
NEUT#: 3.5 10*3/uL (ref 1.5–6.5)
NEUT%: 76.1 % (ref 38.4–76.8)
Platelets: 210 10*3/uL (ref 145–400)
RBC: 4.44 10*6/uL (ref 3.70–5.45)
RDW: 14.8 % — ABNORMAL HIGH (ref 11.2–14.5)
WBC: 4.5 10*3/uL (ref 3.9–10.3)
lymph#: 0.6 10*3/uL — ABNORMAL LOW (ref 0.9–3.3)

## 2017-05-05 LAB — COMPREHENSIVE METABOLIC PANEL
ALT: 17 U/L (ref 0–55)
AST: 26 U/L (ref 5–34)
Albumin: 3.7 g/dL (ref 3.5–5.0)
Alkaline Phosphatase: 74 U/L (ref 40–150)
Anion Gap: 8 mEq/L (ref 3–11)
BUN: 14.1 mg/dL (ref 7.0–26.0)
CO2: 26 mEq/L (ref 22–29)
Calcium: 10.3 mg/dL (ref 8.4–10.4)
Chloride: 102 mEq/L (ref 98–109)
Creatinine: 0.8 mg/dL (ref 0.6–1.1)
EGFR: >60 mL/min/{1.73_m2} (ref >60)
Glucose: 88 mg/dl (ref 70–140)
Potassium: 3.9 mEq/L (ref 3.5–5.1)
Sodium: 137 mEq/L (ref 136–145)
Total Bilirubin: 0.43 mg/dL (ref 0.20–1.20)
Total Protein: 7.2 g/dL (ref 6.4–8.3)

## 2017-05-05 MED ORDER — ACETAMINOPHEN 325 MG PO TABS
650.0000 mg | ORAL_TABLET | Freq: Once | ORAL | Status: AC
  Administered 2017-05-05: 650 mg via ORAL

## 2017-05-05 MED ORDER — DIPHENHYDRAMINE HCL 25 MG PO CAPS
50.0000 mg | ORAL_CAPSULE | Freq: Once | ORAL | Status: AC
  Administered 2017-05-05: 25 mg via ORAL

## 2017-05-05 MED ORDER — ZOLEDRONIC ACID 4 MG/100ML IV SOLN
4.0000 mg | Freq: Once | INTRAVENOUS | Status: AC
  Administered 2017-05-05: 4 mg via INTRAVENOUS
  Filled 2017-05-05: qty 100

## 2017-05-05 MED ORDER — SODIUM CHLORIDE 0.9% FLUSH
10.0000 mL | INTRAVENOUS | Status: DC | PRN
  Administered 2017-05-05: 10 mL
  Filled 2017-05-05: qty 10

## 2017-05-05 MED ORDER — DIPHENHYDRAMINE HCL 25 MG PO CAPS
ORAL_CAPSULE | ORAL | Status: AC
  Filled 2017-05-05: qty 1

## 2017-05-05 MED ORDER — SODIUM CHLORIDE 0.9 % IV SOLN
Freq: Once | INTRAVENOUS | Status: AC
  Administered 2017-05-05: 12:00:00 via INTRAVENOUS

## 2017-05-05 MED ORDER — HEPARIN SOD (PORK) LOCK FLUSH 100 UNIT/ML IV SOLN
500.0000 [IU] | Freq: Once | INTRAVENOUS | Status: AC | PRN
  Administered 2017-05-05: 500 [IU]
  Filled 2017-05-05: qty 5

## 2017-05-05 MED ORDER — SODIUM CHLORIDE 0.9 % IV SOLN
3.2000 mg/kg | Freq: Once | INTRAVENOUS | Status: AC
  Administered 2017-05-05: 200 mg via INTRAVENOUS
  Filled 2017-05-05: qty 10

## 2017-05-08 ENCOUNTER — Ambulatory Visit
Admission: RE | Admit: 2017-05-08 | Discharge: 2017-05-08 | Disposition: A | Discharge status: Home / Self Care | Payer: Medicare Other | Source: Ambulatory Visit | Attending: Radiation Oncology | Admitting: Radiation Oncology

## 2017-05-08 ENCOUNTER — Ambulatory Visit
Admission: RE | Admit: 2017-05-08 | Discharge: 2017-05-08 | Disposition: A | Discharge status: Home / Self Care | Payer: Medicare Other | Source: Ambulatory Visit | Attending: Urology | Admitting: Urology

## 2017-05-08 ENCOUNTER — Encounter: Payer: Self-pay | Admitting: Radiation Oncology

## 2017-05-08 ENCOUNTER — Other Ambulatory Visit: Payer: Self-pay

## 2017-05-08 VITALS — BP 149/87 | HR 82 | Resp 16 | Ht 64.0 in | Wt 131.6 lb

## 2017-05-08 DIAGNOSIS — C7952 Secondary malignant neoplasm of bone marrow: Secondary | ICD-10-CM

## 2017-05-08 DIAGNOSIS — G9520 Unspecified cord compression: Secondary | ICD-10-CM | POA: Diagnosis not present

## 2017-05-08 DIAGNOSIS — C7951 Secondary malignant neoplasm of bone: Secondary | ICD-10-CM

## 2017-05-08 DIAGNOSIS — Z170 Estrogen receptor positive status [ER+]: Secondary | ICD-10-CM | POA: Insufficient documentation

## 2017-05-08 DIAGNOSIS — Z853 Personal history of malignant neoplasm of breast: Secondary | ICD-10-CM | POA: Diagnosis not present

## 2017-05-08 DIAGNOSIS — Z79899 Other long term (current) drug therapy: Secondary | ICD-10-CM | POA: Diagnosis not present

## 2017-05-08 DIAGNOSIS — C7931 Secondary malignant neoplasm of brain: Secondary | ICD-10-CM | POA: Insufficient documentation

## 2017-05-08 DIAGNOSIS — C50919 Malignant neoplasm of unspecified site of unspecified female breast: Secondary | ICD-10-CM | POA: Insufficient documentation

## 2017-05-08 DIAGNOSIS — Z885 Allergy status to narcotic agent status: Secondary | ICD-10-CM | POA: Insufficient documentation

## 2017-05-08 DIAGNOSIS — Z923 Personal history of irradiation: Secondary | ICD-10-CM | POA: Diagnosis not present

## 2017-05-08 NOTE — Progress Notes (Signed)
See progress note under physician encounter. 

## 2017-05-08 NOTE — Progress Notes (Signed)
  Radiation Oncology         [phone] ________________________________  Name: Heather [name] MRN: [id number]  Date: 05/08/2017  DOB: 02/28/1946  SIMULATION AND TREATMENT PLANNING NOTE    ICD-10-CM   1. Metastatic cancer to spine [facility]) C79.51     DIAGNOSIS:  71 yo woman with ER positive breast cancer with painful T8 spinal metastasis  NARRATIVE:  The patient was brought to the [facility].  Identity was confirmed.  All relevant records and images related to the planned course of therapy were reviewed.  The patient freely provided informed written consent to proceed with treatment after reviewing the details related to the planned course of therapy. The consent form was witnessed and verified by the simulation staff.  Then, the patient was set-up in a stable reproducible  supine position for radiation therapy.  CT images were obtained.  Surface markings were placed.  The CT images were loaded into the planning software.  Then the target and avoidance structures were contoured.  Treatment planning then occurred.  The radiation prescription was entered and confirmed.  Then, I designed and supervised the construction of a total of 3 medically necessary complex treatment devices.  I have requested : 3D Simulation  I have requested a DVH of the following structures: left lung right lung, spinal cord, heart, esophagus and target.  I have ordered:Nutrition Consult  SPECIAL TREATMENT PROCEDURE:  The planned course of therapy using radiation constitutes a special treatment procedure. Special care is required in the management of this patient for the following reasons. This treatment constitutes a Special Treatment Procedure for the following reason: [ Retreatment in a previously radiated area requiring careful monitoring of increased risk of toxicity due to overlap of previous treatment..  The special nature of the planned course of radiotherapy will require increased physician supervision  and oversight to ensure patient's safety with optimal treatment outcomes.  PLAN:  The patient will receive 30 Gy in 10 fractions.  ________________________________  Sheral Apley [name], M.D.  This document serves as a record of services personally performed by Tyler Pita, MD. It was created on his behalf by Arlyce Harman, a trained medical scribe. The creation of this record is based on the scribe's personal observations and the provider's statements to them. This document has been checked and approved by the attending provider.

## 2017-05-08 NOTE — Addendum Note (Signed)
Encounter addended by: Heywood Footman, RN on: 05/08/2017 3:05 PM  Actions taken: Sign clinical note

## 2017-05-08 NOTE — Progress Notes (Signed)
Radiation Oncology         [phone] ________________________________  Name: Heather [name] MRN: [id number]  Date: 05/08/2017  DOB: 04/25/1946  Reconsult Note  CC: [name], MD  [name], MD  Diagnosis:   71 y.o. woman with ER positive, PR negative, HER-2 positive metastatic breast cancer with painful T8 Spine metastasis.  Interval Since Last Radiation: 5 months   6/27-7/18/18 SRS and palliative treatment: 1.  The rib 8th was treated to 30 Gy in 10 fractions 2.  Right cerebellar 6 mm target was treated using 3 Dynamic Conformal Arcs to a prescription dose of 20 Gy in one fraction  04/27/16-05/12/16: Left femur/ 30 Gy in 10 fractions  04/13/2015-05/19/2015 Postop:  55 Gy in 25 fractions of 2.2 Gy to the thoracic spine  10/25/12-12/11/12: 50.4 Gy to the right chest wall and regional lymph nodes in 28 fractions with a 10 Gy boost to the right chest wall in 1 session.  Narrative: Heather [name] is a well known patient to our service with a history of metastatic breast cancer originally treated in 2014 to the chest wall and regional lymph nodes. She developed recurrent disease within the thoracic spine and subsequently has undergone 3 surgeries, in addition to radiotherapy, with her most recent surgery being on 06/13/2016 where she had removal of thoracic hardware and replacement of pedicle screws with stabilization.  Of note previously placed screws between T3, T4 T6 and T7 were identified and dissection was carried out inferiorly to T8, T9-T10 and T11, T8 through T11 pedicle screw trajectories were placed bilaterally. She recovered well since treatment to her spine as she had been paralyzed in 2016, and with rigorous therapy has been able to make an impressive recovery. She continues on systemic therapy under the care of [name] and also follows along in the brain and spine oncology conference. She has been on Faslodex, Perjeta, and Herceptin since March of 2018, and in November,  was noted to have some progressive nodular enhancing soft tissue masses in the subcutaneous tissues posteriorly at the level of prior surgery and felt compatible with metastatic disease. She also had a new cerebellar lesion on MRI in November and underwent SRS to this site as well as palliative treatment to her  right eighth rib. She has noticed increased pain in the T8 region and also has  PET positive findings in this region, and a new right lower lobe mass concerning for metastatic disease. She comes today to discuss options of treatment. She is going to switch systemic therapy to Kadcyla and discontinue Faslodex, Herceptin, and Perjeta.   On review of systems, the patient reports that she is doing well overall, but in the last 2-3 weeks, she's noted increasing discomfort that is described as a burning and sometimes tingling pain that radiates from the left aspect of the spine at the T8-9 site and radiates underneath her breast. She describes this as similar to the original pain she developed prior to her paralysis. She denies any sternal chest pain, shortness of breath, cough, fevers, chills, night sweats, unintended weight changes. She denies any bowel or bladder disturbances, and denies abdominal pain, nausea or vomiting. She  denies any new musculoskeletal or joint aches or pains, new skin lesions or concerns. A complete review of systems is obtained and is otherwise negative.  Past Medical History:  Past Medical History:  Diagnosis Date  . Anemia    hx of  . Anxiety    r/t updated surgery  .  Breast cancer ([place])    right  . Cataract    immature-not sure of which eye  . Complication of anesthesia    pt states she is sensitive to meds  . Dizziness    has been going on for 6month;medical MD aware  . Genetic testing 07/11/2016   Test Results: Normal; No pathogenic mutations detected  Genes Analyzed: 43 genes on [employer] Common Cancers panel (APC, ATM, AXIN2, BARD1, BMPR1A, BRCA1, BRCA2, BRIP1,  CDH1, CDKN2A, CHEK2, DICER1, EPCAM, GREM1, HOXB13, KIT, MEN1, MLH1, MSH2, MSH6, MUTYH, NBN, NF1, PALB2, PDGFRA, PMS2, POLD1, POLE, PTEN, RAD50, RAD51C, RAD51D, SDHA, SDHB, SDHC, SDHD, SMAD4, SMARCA4, STK11, TP53, TSC1, TSC2, VHL).  .Marland KitchenGERD (gastroesophageal reflux disease)    Tums prn  . H. pylori infection   . H/O hiatal hernia   . Hemorrhoids   . History of UTI   . Hx of radiation therapy 10/25/12- 12/11/12   right chest wall/regional lymph nodes 5040 cGy, 28 sessions, right chest wall boost 1000 cGy 1 session  . Hx: UTI (urinary tract infection)   . Hyperlipidemia    but not on meds;diet and exercise controlled  . Hypertension    recently started Aldactone   . Insomnia    takes Melatoniin daily  . Metastasis to spinal column ([facility])    T5, Left  Femur cancer- radiation   . Neuropathy    "from back surgery"  . PONV (postoperative nausea and vomiting)    pt experienced hair loss, confusion and combative- 02/15/15  . Right knee pain   . Right shoulder pain   . Skin cancer    squamous, Nodule to back - "same cancer as breast."  . Urinary frequency    d/t taking Aldactone    Past Surgical History: Past Surgical History:  Procedure Laterality Date  . COLONOSCOPY    . cyst removed from left breast  1970  . DECOMPRESSIVE LUMBAR LAMINECTOMY LEVEL 4 N/A 02/15/2015   Procedure: DECOMPRESSION T5 AND T3-T7 STABALIZATION;  Surgeon: NConsuella Lose MD;  [facility];  Service: Neurosurgery;  Laterality: N/A;  . ESOPHAGOGASTRODUODENOSCOPY    . LAMINECTOMY N/A 03/27/2015   Procedure: Thoracic Four-Thoracic Six Laminectomy for tumor;  Surgeon: NConsuella Lose MD;  [facility];  Service: Neurosurgery;  Laterality: N/A;  T4-T6 Laminectomy  . left wrist surgery   2004   with plate  . MASTECTOMY MODIFIED RADICAL  05/10/2012   Procedure: MASTECTOMY MODIFIED RADICAL;  Surgeon: PMerrie Roof MD;  [facility]  Service: General;  Laterality: Right;  RIGHT MODIFIED RADICAL  MASTECTOMY  . MASTECTOMY, RADICAL Right   . MINOR BREAST BIOPSY Left 05/16/2016   Procedure: EXCISION OF BACK NODULE;  Surgeon: PAutumn MessingIII, MD;  [facility]  Service: General;  Laterality: Left;  . Nodule  back  05/17/2015  . PORTACATH PLACEMENT  05/10/2012   Procedure: INSERTION PORT-A-CATH;  Surgeon: PMerrie Roof MD;  [facility];  Service: General;  Laterality: Left;    Social History:  Social History   Socioeconomic History  . Marital status: Married    Spouse name: Not on file  . Number of children: 2  . Years of education: Not on file  . Highest education level: Not on file  Social Needs  . Financial resource strain: Not on file  . Food insecurity - worry: Not on file  . Food insecurity - inability: Not on file  . Transportation needs - medical: Not on  file  . Transportation needs - non-medical: Not on file  Occupational History  . Not on file  Tobacco Use  . Smoking status: Never Smoker  . Smokeless tobacco: Never Used  Substance and Sexual Activity  . Alcohol use: No  . Drug use: No  . Sexual activity: Yes    Birth control/protection: Post-menopausal  Other Topics Concern  . Not on file  Social History Narrative  . Not on file    Family History: Family History  Problem Relation Age of Onset  . Leukemia Mother 61       deceased 56  . Stroke Father   . Diabetes Mellitus II Father   . Prostate cancer Brother 28       currently 59  . Stomach cancer Maternal Grandmother 58       deceased 55  . Prostate cancer Brother 13       currently 46     ALLERGIES:  is allergic to other; acyclovir and related; zanaflex [tizanidine hcl]; codeine; keflex [cephalexin]; naprosyn [naproxen]; oxycodone; tramadol; and tussin [guaifenesin].  Meds: Current Outpatient Medications  Medication Sig Dispense Refill  . ALPRAZolam (XANAX) 0.25 MG tablet Take 1 tablet by mouth as needed.    Marland Kitchen b complex vitamins tablet Take 1 tablet by mouth daily.     . Calcium Carbonate Antacid (TUMS PO) Take 2 tablets by mouth 2 (two) times daily as needed (acid reflux).    . Cholecalciferol (VITAMIN D) 2000 UNITS tablet Take 2,000 Units by mouth daily.    . Coenzyme Q10 (COQ10) 100 MG CAPS Take 100 mg by mouth as needed. Days that she takes pravastatin MWF.    Marland Kitchen fluticasone (FLONASE) 50 MCG/ACT nasal spray Place 1 spray into both nostrils daily as needed.    . gabapentin (NEURONTIN) 600 MG tablet Take 1 tablet (600 mg total) by mouth as directed. Take 1 tablet in the AM and afternoon and 1 1/2 tabs at bedtime (Patient taking differently: Take 600 mg by mouth 3 (three) times daily. Take 1 tablet in the AM and afternoon and 1 1/2 tabs at bedtime) 315 tablet 2  . lidocaine-prilocaine (EMLA) cream Apply to affected area once (Patient taking differently: Apply 1 application topically every 30 (thirty) days. Apply to port prior to infusions) 30 g 3  . OVER THE COUNTER MEDICATION Place 1 drop into both eyes 2 (two) times daily as needed (dry eyes). Over the counter lubricating eye drops    . Probiotic Product (PROBIOTIC DAILY PO) Take 1 capsule by mouth daily.     . ranitidine (ZANTAC) 300 MG tablet Take 300 mg by mouth at bedtime. Reported on 06/25/2015    . spironolactone (ALDACTONE) 25 MG tablet Take 25 mg by mouth daily.    Marland Kitchen HYDROcodone-acetaminophen (NORCO/VICODIN) 5-325 MG tablet     . pravastatin (PRAVACHOL) 10 MG tablet Take 10 mg by mouth every Monday, Wednesday, and Friday.      No current facility-administered medications for this encounter.    Facility-Administered Medications Ordered in Other Encounters  Medication Dose Route Frequency Provider Last Rate Last Dose  . sodium chloride 0.9 % injection 10 mL  10 mL Intravenous PRN [name], MD   10 mL at 04/26/17 2423    Physical Findings:  height is _0  (1.626 m) and weight is 131 lb 9.6 oz (59.7 kg). Her blood pressure is 182/84 (abnormal) and her pulse is 85. Her respiration is 16 and oxygen  saturation is 100%.  Pain Assessment  Pain Score: 0-No pain/10 In general this is a well appearing Caucasian female in no acute distress. She's alert and oriented x4 and appropriate throughout the examination. Cardiopulmonary assessment is negative for acute distress and she exhibits normal effort. She appears grossly neurologically intact.  PERRLA.  EOMs are intact. Speech is clear and well organized, without evidence of difficulty with word finding.  There are palpable subcutaneous nodules bilaterally along T3-T4 and bilatearlly T8-T9. The T8-9 site on the left is most tender to palpation. Cardiovascular exam reveals a regular rate and rhythm. No C/R/M are noted. Chest is clear to auscultation bilaterally.     Lab Findings: Lab Results  Component Value Date   WBC 4.5 05/05/2017   HGB 12.0 05/05/2017   HCT 36.9 05/05/2017   MCV 83.1 05/05/2017   PLT 210 05/05/2017     Radiographic Findings: Nm Pet Image Restag (ps) Skull Base To Thigh  Result Date: 04/18/2017 CLINICAL DATA:  Subsequent treatment strategy for metastatic right breast carcinoma. EXAM: NUCLEAR MEDICINE PET SKULL BASE TO THIGH TECHNIQUE: 13.0 mCi F-18 FDG was injected intravenously. Full-ring PET imaging was performed from the skull base to thigh after the radiotracer. CT data was obtained and used for attenuation correction and anatomic localization. FASTING BLOOD GLUCOSE:  Value: 85 mg/dl COMPARISON:  CT on 01/18/2017 and PET-CT on 10/10/2016 FINDINGS: NECK:  No hypermetabolic lymph nodes or masses. CHEST: No hypermetabolic lymphadenopathy. Stable postop changes from previous right mastectomy and axillary lymph dissection. Stable right lung post radiation changes. Increased bilateral lower lobe atelectasis. New ill-defined rounded airspace opacity is seen in posterior right lower lobe, which measures 4.1 x 2.2 cm and is hypermetabolic with SUV max of 3.9. ABDOMEN/PELVIS: No abnormal hypermetabolic activity within the liver,  pancreas, adrenal glands, or spleen. No hypermetabolic lymph nodes in the abdomen or pelvis. SKELETON: Lytic lesion in the right posterior eighth rib shows further healing, with decreased hypermetabolic activity. Current SUV max of 2.8 compared to 8.9 previously. PLIF hardware again seen within the thoracic spine. Increased size of lytic lesion is seen in T8 vertebral body at site of right pedicle screw, which has SUV max of 6.9 compared to 4.7 previously. Increased size and hypermetabolic activity of several bilateral posterior upper chest wall subcutaneous soft tissue masses. Largest on the right at level of T3 measures 2.5 cm with SUV max of 8.0. Largest on left measures 2.6 cm, with SUV max of 4.5. IMPRESSION: New ill-defined rounded airspace opacity in posterior right lower lobe. Differential diagnosis includes malignancy and infectious/inflammatory process. Increased hypermetabolic lytic lesion in T8 vertebral body, consistent with bone metastasis. Increased hypermetabolic posterior upper chest wall subcutaneous nodules, highly suspicious metastatic disease. Further improvement in right posterior eighth rib metastasis. Electronically Signed   By: Earle Gell M.D.   On: 04/18/2017 13:46    Impression/Plan: 23. 71 yo woman with ER positive breast cancer with a solitary brain metastasis and bony metastatses. Today we reviewed her imaging and clinical exam findings and discusses the role to proceed with radiotherapy to T8. She does has additional disease in the lung and recommends holding off on radiotherapy to this site so it may remain an index lesion to follow while she starts a new systemic therapy. We discussed the delivery and logistics of radiotherapy and anticipate a course of 2 weeks. She is interested in proceeding and will simulate this morning. Written consent is obtained and placed in the chart, a copy was provided to the patient. She will also continue in our  brain oncology program and will be due  for a brain MRI in February 2019.  Of consideration is previous radiation to the T5 level of the spine, the proximal 8th rib and previous breast irradiation.  All of these previous courses create increased risk for complications and will require meticulous radiation planning including dose accumulations of previous radiotherapy.  I am hopeful that I can deliver a meaningful dose of 30 Gy in 10 fractions to the T8 spinal lesion using 3D conformal radiation without high risk of injury. 2. Hypertension. This episode is likely due to her anxiety. Her BP was repeated and was 149/85. She will follow up with her PCP if this remains elevated at subsequent evaluations, or if she becomes symptomatic.   In a visit lasting 45 minutes, greater than 50% of the time was spent face to face discussing her course, and coordinating the patient's care.     Carola Rhine, PAC    Tyler Pita, MD  [facility] Direct Dial: [phone]  Fax: [phone] [email or web address]  Skype  [email or web address]  This document serves as a record of services personally performed by Tyler Pita, MD and Shona Simpson, PA-C. It was created on their behalf by Arlyce Harman, a trained medical scribe. The creation of this record is based on the scribe's personal observations and the provider's statements to them. This document has been checked and approved by the attending provider.

## 2017-05-15 DIAGNOSIS — Z79899 Other long term (current) drug therapy: Secondary | ICD-10-CM | POA: Diagnosis not present

## 2017-05-15 DIAGNOSIS — Z885 Allergy status to narcotic agent status: Secondary | ICD-10-CM | POA: Diagnosis not present

## 2017-05-15 DIAGNOSIS — G9520 Unspecified cord compression: Secondary | ICD-10-CM | POA: Diagnosis not present

## 2017-05-15 DIAGNOSIS — C7951 Secondary malignant neoplasm of bone: Secondary | ICD-10-CM | POA: Diagnosis not present

## 2017-05-15 DIAGNOSIS — C50919 Malignant neoplasm of unspecified site of unspecified female breast: Secondary | ICD-10-CM | POA: Diagnosis not present

## 2017-05-15 DIAGNOSIS — Z170 Estrogen receptor positive status [ER+]: Secondary | ICD-10-CM | POA: Diagnosis not present

## 2017-05-15 DIAGNOSIS — C7931 Secondary malignant neoplasm of brain: Secondary | ICD-10-CM | POA: Diagnosis not present

## 2017-05-16 DIAGNOSIS — H35372 Puckering of macula, left eye: Secondary | ICD-10-CM | POA: Diagnosis not present

## 2017-05-16 DIAGNOSIS — H2513 Age-related nuclear cataract, bilateral: Secondary | ICD-10-CM | POA: Diagnosis not present

## 2017-05-16 DIAGNOSIS — H25013 Cortical age-related cataract, bilateral: Secondary | ICD-10-CM | POA: Diagnosis not present

## 2017-05-16 DIAGNOSIS — I10 Essential (primary) hypertension: Secondary | ICD-10-CM | POA: Diagnosis not present

## 2017-05-16 DIAGNOSIS — H25043 Posterior subcapsular polar age-related cataract, bilateral: Secondary | ICD-10-CM | POA: Diagnosis not present

## 2017-05-18 ENCOUNTER — Ambulatory Visit: Payer: Medicare Other

## 2017-05-18 DIAGNOSIS — C7951 Secondary malignant neoplasm of bone: Secondary | ICD-10-CM | POA: Diagnosis not present

## 2017-05-18 DIAGNOSIS — Z170 Estrogen receptor positive status [ER+]: Secondary | ICD-10-CM | POA: Diagnosis not present

## 2017-05-18 DIAGNOSIS — C7931 Secondary malignant neoplasm of brain: Secondary | ICD-10-CM | POA: Diagnosis not present

## 2017-05-18 DIAGNOSIS — Z885 Allergy status to narcotic agent status: Secondary | ICD-10-CM | POA: Diagnosis not present

## 2017-05-18 DIAGNOSIS — C50919 Malignant neoplasm of unspecified site of unspecified female breast: Secondary | ICD-10-CM | POA: Diagnosis not present

## 2017-05-18 DIAGNOSIS — Z79899 Other long term (current) drug therapy: Secondary | ICD-10-CM | POA: Diagnosis not present

## 2017-05-19 ENCOUNTER — Ambulatory Visit
Admission: RE | Admit: 2017-05-19 | Discharge: 2017-05-19 | Disposition: A | Discharge status: Home / Self Care | Payer: Medicare Other | Source: Ambulatory Visit | Attending: Radiation Oncology | Admitting: Radiation Oncology

## 2017-05-19 DIAGNOSIS — C50919 Malignant neoplasm of unspecified site of unspecified female breast: Secondary | ICD-10-CM | POA: Diagnosis not present

## 2017-05-19 DIAGNOSIS — Z170 Estrogen receptor positive status [ER+]: Secondary | ICD-10-CM | POA: Diagnosis not present

## 2017-05-19 DIAGNOSIS — Z885 Allergy status to narcotic agent status: Secondary | ICD-10-CM | POA: Diagnosis not present

## 2017-05-19 DIAGNOSIS — Z79899 Other long term (current) drug therapy: Secondary | ICD-10-CM | POA: Diagnosis not present

## 2017-05-19 DIAGNOSIS — C7931 Secondary malignant neoplasm of brain: Secondary | ICD-10-CM | POA: Diagnosis not present

## 2017-05-19 DIAGNOSIS — C7951 Secondary malignant neoplasm of bone: Secondary | ICD-10-CM | POA: Diagnosis not present

## 2017-05-19 NOTE — Progress Notes (Signed)
Pt here for patient teaching.  Pt given Radiation and You booklet and skin care instructions.  Reviewed areas of pertinence such as diarrhea, fatigue, nausea and vomiting, skin changes and throat changes . Pt able to give teach back of to pat skin and use unscented/gentle soap,. Pt demonstrated understanding, needs reinforcement, no evidence of learning, refused teaching and of information given and will contact nursing with any questions or concerns.     [email or web address]

## 2017-05-22 ENCOUNTER — Other Ambulatory Visit: Payer: Self-pay | Admitting: Radiation Therapy

## 2017-05-22 ENCOUNTER — Ambulatory Visit
Admission: RE | Admit: 2017-05-22 | Discharge: 2017-05-22 | Disposition: A | Discharge status: Home / Self Care | Payer: Medicare Other | Source: Ambulatory Visit | Attending: Radiation Oncology | Admitting: Radiation Oncology

## 2017-05-22 DIAGNOSIS — C7949 Secondary malignant neoplasm of other parts of nervous system: Secondary | ICD-10-CM

## 2017-05-22 DIAGNOSIS — C7931 Secondary malignant neoplasm of brain: Secondary | ICD-10-CM

## 2017-05-22 DIAGNOSIS — C7951 Secondary malignant neoplasm of bone: Secondary | ICD-10-CM | POA: Diagnosis not present

## 2017-05-22 DIAGNOSIS — C50919 Malignant neoplasm of unspecified site of unspecified female breast: Secondary | ICD-10-CM | POA: Diagnosis not present

## 2017-05-22 DIAGNOSIS — Z170 Estrogen receptor positive status [ER+]: Secondary | ICD-10-CM | POA: Diagnosis not present

## 2017-05-22 DIAGNOSIS — Z885 Allergy status to narcotic agent status: Secondary | ICD-10-CM | POA: Diagnosis not present

## 2017-05-22 DIAGNOSIS — Z79899 Other long term (current) drug therapy: Secondary | ICD-10-CM | POA: Diagnosis not present

## 2017-05-23 ENCOUNTER — Ambulatory Visit
Admission: RE | Admit: 2017-05-23 | Discharge: 2017-05-23 | Disposition: A | Discharge status: Home / Self Care | Payer: Medicare Other | Source: Ambulatory Visit | Attending: Radiation Oncology | Admitting: Radiation Oncology

## 2017-05-23 DIAGNOSIS — Z170 Estrogen receptor positive status [ER+]: Secondary | ICD-10-CM | POA: Diagnosis not present

## 2017-05-23 DIAGNOSIS — Z885 Allergy status to narcotic agent status: Secondary | ICD-10-CM | POA: Diagnosis not present

## 2017-05-23 DIAGNOSIS — C7931 Secondary malignant neoplasm of brain: Secondary | ICD-10-CM | POA: Diagnosis not present

## 2017-05-23 DIAGNOSIS — C7951 Secondary malignant neoplasm of bone: Secondary | ICD-10-CM | POA: Diagnosis not present

## 2017-05-23 DIAGNOSIS — Z79899 Other long term (current) drug therapy: Secondary | ICD-10-CM | POA: Diagnosis not present

## 2017-05-23 DIAGNOSIS — C50919 Malignant neoplasm of unspecified site of unspecified female breast: Secondary | ICD-10-CM | POA: Diagnosis not present

## 2017-05-24 ENCOUNTER — Ambulatory Visit
Admission: RE | Admit: 2017-05-24 | Discharge: 2017-05-24 | Disposition: A | Discharge status: Home / Self Care | Payer: Medicare Other | Source: Ambulatory Visit | Attending: Radiation Oncology | Admitting: Radiation Oncology

## 2017-05-24 DIAGNOSIS — Z170 Estrogen receptor positive status [ER+]: Secondary | ICD-10-CM | POA: Diagnosis not present

## 2017-05-24 DIAGNOSIS — C7951 Secondary malignant neoplasm of bone: Secondary | ICD-10-CM | POA: Diagnosis not present

## 2017-05-24 DIAGNOSIS — C50919 Malignant neoplasm of unspecified site of unspecified female breast: Secondary | ICD-10-CM | POA: Diagnosis not present

## 2017-05-24 DIAGNOSIS — C7931 Secondary malignant neoplasm of brain: Secondary | ICD-10-CM | POA: Diagnosis not present

## 2017-05-24 DIAGNOSIS — Z79899 Other long term (current) drug therapy: Secondary | ICD-10-CM | POA: Diagnosis not present

## 2017-05-24 DIAGNOSIS — Z885 Allergy status to narcotic agent status: Secondary | ICD-10-CM | POA: Diagnosis not present

## 2017-05-24 NOTE — Progress Notes (Addendum)
Patient Care Team: [name], MD as PCP - General (Family Medicine)  DIAGNOSIS:  Encounter Diagnoses  Name Primary?  . Spinal cord compression due to malignant neoplasm metastatic to spine ([place]) Yes  . Brain metastasis ([place])   . Primary cancer of upper outer quadrant of right female breast ([place])     SUMMARY OF ONCOLOGIC HISTORY:   Primary cancer of upper outer quadrant of right female breast ([place])   03/08/2012 Initial Diagnosis    Right breast invasive ductal carcinoma ER positive PR negative HER-2 positive Ki-67 44%; another breast mass biopsied in the anterior part of the breast which was also positive for malignancy that was HER-2 negative      05/10/2012 Surgery    Right mastectomy and axillary lymph node dissection: Multifocal disease 5 cm, 1.7 cm, 1.6 cm, ER positive PR negative HER-2 positive Ki-67 44%, 3/7 lymph nodes positive      06/14/2012 - 06/12/2013 Chemotherapy    Adjuvant chemotherapy with [place] 6 followed by Herceptin maintenance      10/25/2012 - 12/11/2012 Radiation Therapy    Adjuvant radiation therapy      02/07/2013 -  Anti-estrogen oral therapy    Letrozole 2.5 mg daily      02/14/2015 Imaging    MRI spine: Large destructive T5 lesion with severe pathologic compression fracture and extensive epidural tumor, severe spinal stenosis with moderate cord compression, tumor involvement of T4      03/18/2015 PET scan    Residual enhancing soft tissue adjacent to the spinal cord. No evidence of metastatic disease. Nonspecific uptake the left nipple      03/27/2015 - 03/30/2015 Hospital Admission    T4-6 decompression for spinal cord compression (lower extremity paralysis)      04/10/2015 -  Chemotherapy    Palliative treatment with Herceptin every 3 weeks with letrozole 2.5 mg daily      04/13/2015 - 05/19/2015 Radiation Therapy    Palliative radiation treatment to the spine      09/01/2015 Imaging    CT chest abdomen pelvis: Pathologic fracture with  posterior fusion at T5 no other evidence of metastatic disease in the chest abdomen pelvis      12/23/2015 Imaging    CT CAP: new nonspecific 0.6 cm lymph node was stated mediastinum needs follow-up CT, innumerable tiny groundglass pulmonary nodules throughout both lungs unchanged, patchy consolidation from radiation, T5 fracture, no mets      04/15/2016 PET scan    Rt para-spinal mass mildy hypermetabolic SUV 5.9, lytic cortical lesion 4.8 cm lesion left prox femur suv 8.8 favor osseus met disease      04/28/2016 - 05/12/2016 Radiation Therapy    Palliative XRT Left femur      05/24/2016 Procedure    Soft tissue mass biopsy back: Metastatic breast cancer ER 80%, PR 0%, Ki-67 50%, HER-2 positive ratio 2.25      06/09/2016 -  Anti-estrogen oral therapy    Faslodex with Herceptin and Perjeta every 4 weeks      06/13/2016 - 06/15/2016 Hospital Admission    uncomplicated revision of previous thoracic instrumentation.      10/10/2016 PET scan    Interval development of new hypermetabolic 2.5 x 1.1 cm lesion posterior right eighth rib consistent with metastatic disease, stable right paraspinal lesion at T8, clustered soft tissue nodules in the posterior midline back near cervicothoracic junction smaller than before       11/03/2016 - 11/07/2016 Radiation Therapy    Palliative radiation to  right eighth rib      11/04/2016 Imaging    Solitary contrast-enhancing lesion in the right cerebellum 6 x 2 mm concerning for metastatic lesion      11/24/2016 - 11/24/2016 Radiation Therapy    SRS to the brain lesion      04/18/2017 PET scan    New ill-defined rounded airspace opacity in posterior right lower lobe. Differential diagnosis includes malignancy and infectious/inflammatory process.Increased hypermetabolic lytic lesion in T8 vertebral body, consistent with bone metastasis.  Increased hypermetabolic posterior upper chest wall subcutaneous nodules, highly suspicious metastatic disease. Further  improvement in right posterior eighth rib metastasis.      05/04/2017 -  Chemotherapy    Kadcyla every 3 weeks palliative chemotherapy       CHIEF COMPLIANT: Cycle 2 Kadcyla  INTERVAL HISTORY: Heather [name] is a 72 year old with above-mentioned history of metastatic breast cancer is currently on Kadcyla and received her first cycle of chemotherapy 3 weeks ago.  She is here to receive her second treatment.  She reports to me that she has tolerated first cycle extremely well.  She did not have any nausea or vomiting.  Denies any fevers or chills.  She has chronic back issues.  Pain is well controlled.  She complains of a burning sensation right after the infusion was completed that lasted several days especially worse on the left leg.  She also experienced some discomfort in her hands.  REVIEW OF SYSTEMS:   Constitutional: Denies fevers, chills or abnormal weight loss Eyes: Denies blurriness of vision Ears, nose, mouth, throat, and face: Denies mucositis or sore throat Respiratory: Denies cough, dyspnea or wheezes Cardiovascular: Denies palpitation, chest discomfort Gastrointestinal:  Denies nausea, heartburn or change in bowel habits Skin: Denies abnormal skin rashes Lymphatics: Denies new lymphadenopathy or easy bruising Neurological: Neuropathy in her feet and hands Behavioral/Psych: Mood is stable, no new changes  Extremities: No lower extremity edema Breast:  denies any pain or lumps or nodules in either breasts All other systems were reviewed with the patient and are negative.  I have reviewed the past medical history, past surgical history, social history and family history with the patient and they are unchanged from previous note.  ALLERGIES:  is allergic to other; acyclovir and related; zanaflex [tizanidine hcl]; codeine; keflex [cephalexin]; naprosyn [naproxen]; oxycodone; tramadol; and tussin [guaifenesin].  MEDICATIONS:  Current Outpatient Medications  Medication Sig  Dispense Refill  . ALPRAZolam (XANAX) 0.25 MG tablet Take 1 tablet by mouth as needed.    Marland Kitchen b complex vitamins tablet Take 1 tablet by mouth daily.    . Calcium Carbonate Antacid (TUMS PO) Take 2 tablets by mouth 2 (two) times daily as needed (acid reflux).    . Cholecalciferol (VITAMIN D) 2000 UNITS tablet Take 2,000 Units by mouth daily.    . Coenzyme Q10 (COQ10) 100 MG CAPS Take 100 mg by mouth as needed. Days that she takes pravastatin MWF.    Marland Kitchen fluticasone (FLONASE) 50 MCG/ACT nasal spray Place 1 spray into both nostrils daily as needed.    . gabapentin (NEURONTIN) 600 MG tablet Take 1 tablet (600 mg total) by mouth as directed. Take 1 tablet in the AM and afternoon and 1 1/2 tabs at bedtime (Patient taking differently: Take 600 mg by mouth 3 (three) times daily. Take 1 tablet in the AM and afternoon and 1 1/2 tabs at bedtime) 315 tablet 2  . HYDROcodone-acetaminophen (NORCO/VICODIN) 5-325 MG tablet     . lidocaine-prilocaine (EMLA) cream Apply to  affected area once (Patient taking differently: Apply 1 application topically every 30 (thirty) days. Apply to port prior to infusions) 30 g 3  . OVER THE COUNTER MEDICATION Place 1 drop into both eyes 2 (two) times daily as needed (dry eyes). Over the counter lubricating eye drops    . pravastatin (PRAVACHOL) 10 MG tablet Take 10 mg by mouth every Monday, Wednesday, and Friday.     . Probiotic Product (PROBIOTIC DAILY PO) Take 1 capsule by mouth daily.     . ranitidine (ZANTAC) 300 MG tablet Take 300 mg by mouth at bedtime. Reported on 06/25/2015    . spironolactone (ALDACTONE) 25 MG tablet Take 25 mg by mouth daily.     No current facility-administered medications for this visit.    Facility-Administered Medications Ordered in Other Visits  Medication Dose Route Frequency Provider Last Rate Last Dose  . sodium chloride 0.9 % injection 10 mL  10 mL Intravenous PRN [name], MD   10 mL at 04/26/17 7342    PHYSICAL EXAMINATION: ECOG  PERFORMANCE STATUS: 1 - Symptomatic but completely ambulatory  Vitals:   05/25/17 0855  BP: (!) 152/69  Pulse: 76  Resp: 17  Temp: 97.6 F (36.4 C)  SpO2: 98%   Filed Weights   05/25/17 0855  Weight: 137 lb 9.6 oz (62.4 kg)    GENERAL:alert, no distress and comfortable SKIN: skin color, texture, turgor are normal, no rashes or significant lesions EYES: normal, Conjunctiva are pink and non-injected, sclera clear OROPHARYNX:no exudate, no erythema and lips, buccal mucosa, and tongue normal  NECK: supple, thyroid normal size, non-tender, without nodularity LYMPH:  no palpable lymphadenopathy in the cervical, axillary or inguinal LUNGS: clear to auscultation and percussion with normal breathing effort HEART: regular rate & rhythm and no murmurs and no lower extremity edema ABDOMEN:abdomen soft, non-tender and normal bowel sounds MUSCULOSKELETAL:no cyanosis of digits and no clubbing  the subcutaneous nodule on the back appears to be more flatter and more nodular NEURO: alert & oriented x 3 with fluent speech, burning sensation neuropathy in her feet and hands EXTREMITIES: No lower extremity edema   LABORATORY DATA:  I have reviewed the data as listed CMP Latest Ref Rng & Units 05/05/2017 04/26/2017 03/29/2017  Glucose 70 - 140 mg/dl 88 87 82  BUN 7.0 - 26.0 mg/dL 14.1 12.4 10.0  Creatinine 0.6 - 1.1 mg/dL 0.8 0.8 0.8  Sodium 136 - 145 mEq/L 137 137 137  Potassium 3.5 - 5.1 mEq/L 3.9 3.8 4.2  Chloride 101 - 111 mmol/L - - -  CO2 22 - 29 mEq/L _0 Calcium 8.4 - 10.4 mg/dL 10.3 9.9 9.6  Total Protein 6.4 - 8.3 g/dL 7.2 7.1 -  Total Bilirubin 0.20 - 1.20 mg/dL 0.43 0.46 -  Alkaline Phos 40 - 150 U/L 74 73 -  AST 5 - 34 U/L 26 22 -  ALT 0 - 55 U/L 17 14 -    Lab Results  Component Value Date   WBC 4.1 05/25/2017   HGB 11.9 05/25/2017   HCT 36.2 05/25/2017   MCV 81.0 05/25/2017   PLT 223 05/25/2017   NEUTROABS 3.1 05/25/2017    ASSESSMENT & PLAN:  Primary cancer  of upper outer quadrant of right female breast ([place]) Right breast invasive ductal carcinoma ER positive PR negative HER-2 positive Ki-67 44% multifocal disease 3/7 lymph nodes positive T2 N1 M0 stage IIB status post adjuvant chemotherapy with TCH followed by Herceptin maintenance, adjuvant radiation therapy and  Letrozole. MRI 02/14/2015: T5 large destructive lesion with pathologic compression fracture and extensive epidural tumor, involvement of T4, excision metastatic carcinoma ER 60%, PR 0%, HER-2 positive ratio 2.71 average copy #7.45 03/27/2015: T4-6 decompression surgery for spinal cord compression PET-CT 12/10/17Rt para-spinal mass mildy hypermetabolic SUV 5.9, lytic cortical lesion 4.8 cm lesion left prox femur suv 8.8 favor osseus met disease   Treatment summary: 1. Resumed anastrozole 05/21/2015, but it has caused significant hair loss so we switched her to exemestane 25 mg once daily 07/23/2015 2. Herceptin every 3 week started 04/10/2015 3. Bone metastases: Zometa every 3 months. 4. Radiation therapy to the spine (Dr. [name]) 04/13/2015 to 05/19/2015 5. Radiation therapy to left femur 04/28/2016 to 05/12/2016  6. SRS to the brain lesion 11/24/2016 -------------------------------------------------------------------------------------------------------------------------- Goal of treatment: Palliation Treatment plan:Kadcyla 05/05/2017 every 3 weeks Chemo toxicities: 1.  Peripheral neuropathy chemotherapy-induced: I decreased the dosage of Kadcyla to 3 mg/m square. 2. very mild nausea 3.  Hair thinning  Monitoring the subcutaneous nodule closely. Currently on palliative radiation as well. Bone metastases: Zometa every 3 months with calcium and vitamin D Return to clinic in 3 weeks for cycle 3  Our plans to perform scans after 3 cycles.  I spent 25 minutes talking to the patient of which more than half was spent in counseling and coordination of care.  No orders of the defined  types were placed in this encounter.  The patient has a good understanding of the overall plan. she agrees with it. she will call with any problems that may develop before the next visit here.   Harriette Ohara, MD 05/25/17

## 2017-05-24 NOTE — Assessment & Plan Note (Signed)
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 MRI 02/14/2015: T5 large destructive lesion with pathologic compression fracture and extensive epidural tumor, involvement of T4, excision metastatic carcinoma ER 60%, PR 0%, HER-2 positive ratio 2.71 average copy #7.45 03/27/2015: T4-6 decompression surgery for spinal cord compression PET-CT 12/10/17Rt para-spinal mass mildy hypermetabolic SUV 5.9, lytic cortical lesion 4.8 cm lesion left prox femur suv 8.8 favor osseus met disease   Treatment summary: 1. Resumed anastrozole 05/21/2015, but it has caused significant hair loss so we switched her to exemestane 25 mg once daily 07/23/2015 2. Herceptin every 3 week started 04/10/2015 3. Bone metastases: Zometa every 3 months. 4. Radiation therapy to the spine (Dr. [name]) 04/13/2015 to 05/19/2015 5. Radiation therapy to left femur 04/28/2016 to 05/12/2016  6. SRS to the brain lesion 11/24/2016 -------------------------------------------------------------------------------------------------------------------------- Goal of treatment: Palliation Treatment plan:Kadcyla 05/05/2017 every 3 weeks Chemo toxicities:  Bone metastases: Xgeva every 3 months with calcium and vitamin D Return to clinic in 3 weeks for cycle 3

## 2017-05-25 ENCOUNTER — Other Ambulatory Visit: Payer: Self-pay | Admitting: *Deleted

## 2017-05-25 ENCOUNTER — Telehealth: Payer: Self-pay | Admitting: Hematology and Oncology

## 2017-05-25 ENCOUNTER — Inpatient Hospital Stay: Payer: Medicare Other

## 2017-05-25 ENCOUNTER — Inpatient Hospital Stay (HOSPITAL_BASED_OUTPATIENT_CLINIC_OR_DEPARTMENT_OTHER): Payer: Medicare Other | Admitting: Hematology and Oncology

## 2017-05-25 ENCOUNTER — Inpatient Hospital Stay: Payer: Medicare Other | Attending: Hematology and Oncology

## 2017-05-25 ENCOUNTER — Ambulatory Visit
Admission: RE | Admit: 2017-05-25 | Discharge: 2017-05-25 | Disposition: A | Discharge status: Home / Self Care | Payer: Medicare Other | Source: Ambulatory Visit | Attending: Radiation Oncology | Admitting: Radiation Oncology

## 2017-05-25 VITALS — BP 139/59 | HR 79 | Temp 98.0°F | Resp 18

## 2017-05-25 VITALS — BP 152/69 | HR 76 | Temp 97.6°F | Resp 17 | Ht 64.0 in | Wt 137.6 lb

## 2017-05-25 DIAGNOSIS — Z79899 Other long term (current) drug therapy: Secondary | ICD-10-CM

## 2017-05-25 DIAGNOSIS — C50411 Malignant neoplasm of upper-outer quadrant of right female breast: Secondary | ICD-10-CM

## 2017-05-25 DIAGNOSIS — Z923 Personal history of irradiation: Secondary | ICD-10-CM

## 2017-05-25 DIAGNOSIS — C7951 Secondary malignant neoplasm of bone: Secondary | ICD-10-CM | POA: Insufficient documentation

## 2017-05-25 DIAGNOSIS — Z9011 Acquired absence of right breast and nipple: Secondary | ICD-10-CM

## 2017-05-25 DIAGNOSIS — Z9221 Personal history of antineoplastic chemotherapy: Secondary | ICD-10-CM

## 2017-05-25 DIAGNOSIS — C50911 Malignant neoplasm of unspecified site of right female breast: Secondary | ICD-10-CM

## 2017-05-25 DIAGNOSIS — Z170 Estrogen receptor positive status [ER+]: Secondary | ICD-10-CM

## 2017-05-25 DIAGNOSIS — C7931 Secondary malignant neoplasm of brain: Secondary | ICD-10-CM | POA: Diagnosis not present

## 2017-05-25 DIAGNOSIS — G9529 Other cord compression: Secondary | ICD-10-CM

## 2017-05-25 DIAGNOSIS — C50919 Malignant neoplasm of unspecified site of unspecified female breast: Secondary | ICD-10-CM

## 2017-05-25 DIAGNOSIS — Z885 Allergy status to narcotic agent status: Secondary | ICD-10-CM | POA: Diagnosis not present

## 2017-05-25 DIAGNOSIS — Z5112 Encounter for antineoplastic immunotherapy: Secondary | ICD-10-CM | POA: Insufficient documentation

## 2017-05-25 DIAGNOSIS — G992 Myelopathy in diseases classified elsewhere: Secondary | ICD-10-CM | POA: Insufficient documentation

## 2017-05-25 LAB — COMPREHENSIVE METABOLIC PANEL
ALT: 36 U/L (ref 0–55)
AST: 44 U/L — ABNORMAL HIGH (ref 5–34)
Albumin: 3.7 g/dL (ref 3.5–5.0)
Alkaline Phosphatase: 88 U/L (ref 40–150)
Anion gap: 8 (ref 3–11)
BUN: 11 mg/dL (ref 7–26)
CO2: 27 mmol/L (ref 22–29)
Calcium: 10.1 mg/dL (ref 8.4–10.4)
Chloride: 100 mmol/L (ref 98–109)
Creatinine, Ser: 0.75 mg/dL (ref 0.60–1.10)
GFR calc Af Amer: >60 mL/min (ref >60)
GFR calc non Af Amer: >60 mL/min (ref >60)
Glucose, Bld: 80 mg/dL (ref 70–140)
Potassium: 3.8 mmol/L (ref 3.3–4.7)
Sodium: 135 mmol/L — ABNORMAL LOW (ref 136–145)
Total Bilirubin: 0.5 mg/dL (ref 0.2–1.2)
Total Protein: 7.4 g/dL (ref 6.4–8.3)

## 2017-05-25 LAB — CBC WITH DIFFERENTIAL/PLATELET
Basophils Absolute: 0 10*3/uL (ref 0.0–0.1)
Basophils Relative: 1 %
Eosinophils Absolute: 0.1 10*3/uL (ref 0.0–0.5)
Eosinophils Relative: 2 %
HCT: 36.2 % (ref 34.8–46.6)
Hemoglobin: 11.9 g/dL (ref 11.6–15.9)
Lymphocytes Relative: 11 %
Lymphs Abs: 0.4 10*3/uL — ABNORMAL LOW (ref 0.9–3.3)
MCH: 26.7 pg (ref 25.1–34.0)
MCHC: 33 g/dL (ref 31.5–36.0)
MCV: 81 fL (ref 79.5–101.0)
Monocytes Absolute: 0.4 10*3/uL (ref 0.1–0.9)
Monocytes Relative: 10 %
Neutro Abs: 3.1 10*3/uL (ref 1.5–6.5)
Neutrophils Relative %: 76 %
Platelets: 223 10*3/uL (ref 145–400)
RBC: 4.47 MIL/uL (ref 3.70–5.45)
RDW: 14.7 % (ref 11.2–16.1)
WBC: 4.1 10*3/uL (ref 3.9–10.3)

## 2017-05-25 MED ORDER — DIPHENHYDRAMINE HCL 25 MG PO CAPS
ORAL_CAPSULE | ORAL | Status: AC
  Filled 2017-05-25: qty 1

## 2017-05-25 MED ORDER — SODIUM CHLORIDE 0.9% FLUSH
10.0000 mL | INTRAVENOUS | Status: DC | PRN
  Filled 2017-05-25: qty 10

## 2017-05-25 MED ORDER — DIPHENHYDRAMINE HCL 25 MG PO CAPS
50.0000 mg | ORAL_CAPSULE | Freq: Once | ORAL | Status: AC
  Administered 2017-05-25: 25 mg via ORAL

## 2017-05-25 MED ORDER — SODIUM CHLORIDE 0.9% FLUSH
10.0000 mL | INTRAVENOUS | Status: DC | PRN
  Administered 2017-05-25: 10 mL
  Filled 2017-05-25: qty 10

## 2017-05-25 MED ORDER — SODIUM CHLORIDE 0.9 % IJ SOLN
10.0000 mL | Freq: Once | INTRAMUSCULAR | Status: AC
  Administered 2017-05-25: 10 mL via INTRAVENOUS
  Filled 2017-05-25: qty 10

## 2017-05-25 MED ORDER — ACETAMINOPHEN 325 MG PO TABS
ORAL_TABLET | ORAL | Status: AC
  Filled 2017-05-25: qty 2

## 2017-05-25 MED ORDER — SODIUM CHLORIDE 0.9 % IV SOLN
3.0000 mg/kg | Freq: Once | INTRAVENOUS | Status: AC
  Administered 2017-05-25: 180 mg via INTRAVENOUS
  Filled 2017-05-25: qty 9

## 2017-05-25 MED ORDER — SODIUM CHLORIDE 0.9 % IV SOLN
Freq: Once | INTRAVENOUS | Status: AC
  Administered 2017-05-25: 10:00:00 via INTRAVENOUS

## 2017-05-25 MED ORDER — HEPARIN SOD (PORK) LOCK FLUSH 100 UNIT/ML IV SOLN
500.0000 [IU] | Freq: Once | INTRAVENOUS | Status: AC | PRN
  Administered 2017-05-25: 500 [IU]
  Filled 2017-05-25: qty 5

## 2017-05-25 MED ORDER — SONAFINE EX EMUL
1.0000 "application " | Freq: Two times a day (BID) | CUTANEOUS | Status: DC
  Administered 2017-05-25: 1 via TOPICAL

## 2017-05-25 MED ORDER — ACETAMINOPHEN 325 MG PO TABS
650.0000 mg | ORAL_TABLET | Freq: Once | ORAL | Status: AC
  Administered 2017-05-25: 650 mg via ORAL

## 2017-05-25 NOTE — Patient Instructions (Signed)

## 2017-05-25 NOTE — Patient Instructions (Signed)
[facility] Discharge Instructions for Patients Receiving Chemotherapy  Today you received the following chemotherapy agent: Kadcyla  To help prevent nausea and vomiting after your treatment, we encourage you to take your nausea medication as prescribed.   If you develop nausea and vomiting that is not controlled by your nausea medication, call the clinic.   BELOW ARE SYMPTOMS THAT SHOULD BE REPORTED IMMEDIATELY:  *FEVER GREATER THAN 100.5 F  *CHILLS WITH OR WITHOUT FEVER  NAUSEA AND VOMITING THAT IS NOT CONTROLLED WITH YOUR NAUSEA MEDICATION  *UNUSUAL SHORTNESS OF BREATH  *UNUSUAL BRUISING OR BLEEDING  TENDERNESS IN MOUTH AND THROAT WITH OR WITHOUT PRESENCE OF ULCERS  *URINARY PROBLEMS  *BOWEL PROBLEMS  UNUSUAL RASH Items with * indicate a potential emergency and should be followed up as soon as possible.  Feel free to call the clinic should you have any questions or concerns. The clinic phone number is [phone].  Please show the [facility] at check-in to the Emergency Department and triage nurse.

## 2017-05-25 NOTE — Telephone Encounter (Signed)
Patient will receive a copy of updated schedule in treatment area. Patient scheduled per 1/17 los.

## 2017-05-26 ENCOUNTER — Ambulatory Visit
Admission: RE | Admit: 2017-05-26 | Discharge: 2017-05-26 | Disposition: A | Discharge status: Home / Self Care | Payer: Medicare Other | Source: Ambulatory Visit | Attending: Radiation Oncology | Admitting: Radiation Oncology

## 2017-05-26 DIAGNOSIS — C7931 Secondary malignant neoplasm of brain: Secondary | ICD-10-CM | POA: Diagnosis not present

## 2017-05-26 DIAGNOSIS — C50919 Malignant neoplasm of unspecified site of unspecified female breast: Secondary | ICD-10-CM | POA: Diagnosis not present

## 2017-05-26 DIAGNOSIS — Z170 Estrogen receptor positive status [ER+]: Secondary | ICD-10-CM | POA: Diagnosis not present

## 2017-05-26 DIAGNOSIS — C7951 Secondary malignant neoplasm of bone: Secondary | ICD-10-CM | POA: Diagnosis not present

## 2017-05-26 DIAGNOSIS — Z885 Allergy status to narcotic agent status: Secondary | ICD-10-CM | POA: Diagnosis not present

## 2017-05-26 DIAGNOSIS — Z79899 Other long term (current) drug therapy: Secondary | ICD-10-CM | POA: Diagnosis not present

## 2017-05-29 ENCOUNTER — Ambulatory Visit
Admission: RE | Admit: 2017-05-29 | Discharge: 2017-05-29 | Disposition: A | Discharge status: Home / Self Care | Payer: Medicare Other | Source: Ambulatory Visit | Attending: Radiation Oncology | Admitting: Radiation Oncology

## 2017-05-29 DIAGNOSIS — C50919 Malignant neoplasm of unspecified site of unspecified female breast: Secondary | ICD-10-CM | POA: Diagnosis not present

## 2017-05-29 DIAGNOSIS — C7931 Secondary malignant neoplasm of brain: Secondary | ICD-10-CM | POA: Diagnosis not present

## 2017-05-29 DIAGNOSIS — Z885 Allergy status to narcotic agent status: Secondary | ICD-10-CM | POA: Diagnosis not present

## 2017-05-29 DIAGNOSIS — Z79899 Other long term (current) drug therapy: Secondary | ICD-10-CM | POA: Diagnosis not present

## 2017-05-29 DIAGNOSIS — Z170 Estrogen receptor positive status [ER+]: Secondary | ICD-10-CM | POA: Diagnosis not present

## 2017-05-29 DIAGNOSIS — C7951 Secondary malignant neoplasm of bone: Secondary | ICD-10-CM | POA: Diagnosis not present

## 2017-05-30 ENCOUNTER — Telehealth: Payer: Self-pay | Admitting: Hematology and Oncology

## 2017-05-30 ENCOUNTER — Ambulatory Visit
Admission: RE | Admit: 2017-05-30 | Discharge: 2017-05-30 | Disposition: A | Discharge status: Home / Self Care | Payer: Medicare Other | Source: Ambulatory Visit | Attending: Radiation Oncology | Admitting: Radiation Oncology

## 2017-05-30 DIAGNOSIS — C7951 Secondary malignant neoplasm of bone: Secondary | ICD-10-CM | POA: Diagnosis not present

## 2017-05-30 DIAGNOSIS — C50919 Malignant neoplasm of unspecified site of unspecified female breast: Secondary | ICD-10-CM | POA: Diagnosis not present

## 2017-05-30 DIAGNOSIS — Z79899 Other long term (current) drug therapy: Secondary | ICD-10-CM | POA: Diagnosis not present

## 2017-05-30 DIAGNOSIS — Z885 Allergy status to narcotic agent status: Secondary | ICD-10-CM | POA: Diagnosis not present

## 2017-05-30 DIAGNOSIS — Z170 Estrogen receptor positive status [ER+]: Secondary | ICD-10-CM | POA: Diagnosis not present

## 2017-05-30 DIAGNOSIS — C7931 Secondary malignant neoplasm of brain: Secondary | ICD-10-CM | POA: Diagnosis not present

## 2017-05-30 NOTE — Telephone Encounter (Signed)
Updated patients February schedule per patient request. Patient prefers to have MRI on 2/7 and all other appointments moved to 2/8.

## 2017-05-31 ENCOUNTER — Encounter: Payer: Self-pay | Admitting: Radiation Oncology

## 2017-05-31 ENCOUNTER — Ambulatory Visit
Admission: RE | Admit: 2017-05-31 | Discharge: 2017-05-31 | Disposition: A | Discharge status: Home / Self Care | Payer: Medicare Other | Source: Ambulatory Visit | Attending: Radiation Oncology | Admitting: Radiation Oncology

## 2017-05-31 DIAGNOSIS — Z885 Allergy status to narcotic agent status: Secondary | ICD-10-CM | POA: Diagnosis not present

## 2017-05-31 DIAGNOSIS — Z79899 Other long term (current) drug therapy: Secondary | ICD-10-CM | POA: Diagnosis not present

## 2017-05-31 DIAGNOSIS — Z170 Estrogen receptor positive status [ER+]: Secondary | ICD-10-CM | POA: Diagnosis not present

## 2017-05-31 DIAGNOSIS — C7951 Secondary malignant neoplasm of bone: Secondary | ICD-10-CM | POA: Diagnosis not present

## 2017-05-31 DIAGNOSIS — C50919 Malignant neoplasm of unspecified site of unspecified female breast: Secondary | ICD-10-CM | POA: Diagnosis not present

## 2017-05-31 DIAGNOSIS — C7931 Secondary malignant neoplasm of brain: Secondary | ICD-10-CM | POA: Diagnosis not present

## 2017-06-01 NOTE — Progress Notes (Signed)
  Radiation Oncology         [phone] ________________________________  Name: Heather [name] MRN: [id number]  Date: 05/31/2017  DOB: 09-10-1945  End of Treatment Note  Diagnosis:   72 y.o. female with ER positive breast cancer with painful T8 spinal metastasis    Indication for treatment:  Palliative       Radiation treatment dates:   05/18/2017 - 05/31/2017  Site/dose:   The thoracic spine (T7-T9) was treated to 30 Gy in 10 fractions of 3 Gy.  Beams/energy:   3D / 6X, 10X Photon  Narrative: The patient tolerated radiation treatment relatively well.   She experienced mild fatigue and some esophagitis with a cough. She reported occasional nausea and decreased appetite. She denied diarrhea but did have some constipation, relieved by miralax. She had mild hyperpigmentation in the treatment field but no desquamation or skin breakdown.   Plan: The patient has completed radiation treatment. The patient will return to radiation oncology clinic for routine followup in one month. I advised her to call or return sooner if she has any questions or concerns related to her recovery or treatment. ________________________________  Sheral Apley. [name], M.D.  This document serves as a record of services personally performed by Tyler Pita, MD. It was created on his behalf by Rae Lips, a trained medical scribe. The creation of this record is based on the scribe's personal observations and the provider's statements to them. This document has been checked and approved by the attending provider.

## 2017-06-06 ENCOUNTER — Other Ambulatory Visit: Payer: Self-pay | Admitting: Hematology and Oncology

## 2017-06-06 NOTE — Progress Notes (Signed)
Patient was not able to tolerate Kadcyla 3 mg/m square. I decreased the dosage to 2.5 mg/m square.

## 2017-06-07 ENCOUNTER — Telehealth: Payer: Self-pay

## 2017-06-07 NOTE — Telephone Encounter (Signed)
Returned pt call and Confirmed treatment plan reduction per [name] for next infusion.  Cyndia Bent RN

## 2017-06-14 NOTE — Progress Notes (Signed)
Heather [name] 72 y.o. female with ER positive breast cancer withpainful T8 spinal metastasis radiation completed 05-31-17, review 02-07-19MRI brain w wo contrast, FU.               Pain:No Bowel/Bladder retention or incontinence: Having some  Constipation sine starting chemotherapy taking Miralax and cranberry supplement Numbness or weakness in extremities:No using a cane to walk.  Current Decadron regimen, if applicable: No Fatigue:Having fatigue  Swallowing issues:Swallowing issues have improved Skin status:None Nausea/Vomiting:Nausea at times. Weight: Wt Readings from Last 3 Encounters:  06/20/17 130 lb (59 kg)  06/16/17 131 lb 6.4 oz (59.6 kg)  05/25/17 137 lb 9.6 oz (62.4 kg)             Ambulatory status? Walker? Wheelchair?: Using a cane         BP 140/73 (BP Location: Left Arm, Patient Position: Sitting, Cuff Size: Normal)   Pulse 82   Temp 98 F (36.7 C) (Oral)   Resp 18   Ht 5\' 4"  (1.626 m)   Wt 130 lb (59 kg)   SpO2 100%   BMI 22.31 kg/m

## 2017-06-15 ENCOUNTER — Ambulatory Visit: Payer: Medicare Other

## 2017-06-15 ENCOUNTER — Other Ambulatory Visit: Payer: Medicare Other

## 2017-06-15 ENCOUNTER — Ambulatory Visit
Admission: RE | Admit: 2017-06-15 | Discharge: 2017-06-15 | Disposition: A | Discharge status: Home / Self Care | Payer: Medicare Other | Source: Ambulatory Visit | Attending: Radiation Oncology | Admitting: Radiation Oncology

## 2017-06-15 ENCOUNTER — Ambulatory Visit: Payer: Medicare Other | Admitting: Adult Health

## 2017-06-15 DIAGNOSIS — C7949 Secondary malignant neoplasm of other parts of nervous system: Secondary | ICD-10-CM

## 2017-06-15 DIAGNOSIS — C7931 Secondary malignant neoplasm of brain: Secondary | ICD-10-CM

## 2017-06-15 DIAGNOSIS — C50919 Malignant neoplasm of unspecified site of unspecified female breast: Secondary | ICD-10-CM | POA: Diagnosis not present

## 2017-06-15 MED ORDER — GADOBENATE DIMEGLUMINE 529 MG/ML IV SOLN
12.0000 mL | Freq: Once | INTRAVENOUS | Status: AC | PRN
  Administered 2017-06-15: 12 mL via INTRAVENOUS

## 2017-06-15 NOTE — Assessment & Plan Note (Addendum)
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 MRI 02/14/2015: T5 large destructive lesion with pathologic compression fracture and extensive epidural tumor, involvement of T4, excision metastatic carcinoma ER 60%, PR 0%, HER-2 positive ratio 2.71 average copy #7.45 03/27/2015: T4-6 decompression surgery for spinal cord compression PET-CT 12/10/17Rt para-spinal mass mildy hypermetabolic SUV 5.9, lytic cortical lesion 4.8 cm lesion left prox femur suv 8.8 favor osseus met disease Echo 04/12/2017 shows well preserved ejection fraction of 55-60%   Treatment summary: 1. Resumed anastrozole 05/21/2015, but it has caused significant hair loss so we switched her to exemestane 25 mg once daily 07/23/2015 2. Herceptin every 3 week started 04/10/2015 3. Bone metastases: Zometa every 3 months. 4. Radiation therapy to the spine (Dr. [name]) 04/13/2015 to 05/19/2015 5. Radiation therapy to left femur 04/28/2016 to 05/12/2016  6. SRS to the brain lesion 11/24/2016 -------------------------------------------------------------------------------------------------------------------------- Goal of treatment: Palliation Treatment plan:Kadcyla 05/05/2017 every 3 weeks Chemo toxicities: She had burning of her feet after last cycle, Kadcyla further dose reduced today by [name].   Knee pain: xrays of both knees after kadcyla She will f/u with radiation oncology on Tuesday.    Bone metastases: Zometa every 3 months with calcium and vitamin D (due again in March) Return to clinic in 3 weeks for cycle 4

## 2017-06-15 NOTE — Progress Notes (Signed)
[facility] Follow up:    [name], MD [place] [id number]   DIAGNOSIS: Cancer Staging Primary cancer of upper outer quadrant of right female breast [facility]) Staging form: Breast, AJCC 7th Edition - Clinical: Stage IIB (T2, N1, cM0) - Unsigned Staging comments: Staged at breast conference 11.6.13  - Pathologic: No stage assigned - Unsigned   SUMMARY OF ONCOLOGIC HISTORY:   Primary cancer of upper outer quadrant of right female breast ([place])   03/08/2012 Initial Diagnosis    Right breast invasive ductal carcinoma ER positive PR negative HER-2 positive Ki-67 44%; another breast mass biopsied in the anterior part of the breast which was also positive for malignancy that was HER-2 negative      05/10/2012 Surgery    Right mastectomy and axillary lymph node dissection: Multifocal disease 5 cm, 1.7 cm, 1.6 cm, ER positive PR negative HER-2 positive Ki-67 44%, 3/7 lymph nodes positive      06/14/2012 - 06/12/2013 Chemotherapy    Adjuvant chemotherapy with [place] 6 followed by Herceptin maintenance      10/25/2012 - 12/11/2012 Radiation Therapy    Adjuvant radiation therapy      02/07/2013 -  Anti-estrogen oral therapy    Letrozole 2.5 mg daily      02/14/2015 Imaging    MRI spine: Large destructive T5 lesion with severe pathologic compression fracture and extensive epidural tumor, severe spinal stenosis with moderate cord compression, tumor involvement of T4      03/18/2015 PET scan    Residual enhancing soft tissue adjacent to the spinal cord. No evidence of metastatic disease. Nonspecific uptake the left nipple      03/27/2015 - 03/30/2015 Hospital Admission    T4-6 decompression for spinal cord compression (lower extremity paralysis)      04/10/2015 -  Chemotherapy    Palliative treatment with Herceptin every 3 weeks with letrozole 2.5 mg daily      04/13/2015 - 05/19/2015 Radiation Therapy    Palliative radiation treatment to  the spine      09/01/2015 Imaging    CT chest abdomen pelvis: Pathologic fracture with posterior fusion at T5 no other evidence of metastatic disease in the chest abdomen pelvis      12/23/2015 Imaging    CT CAP: new nonspecific 0.6 cm lymph node was stated mediastinum needs follow-up CT, innumerable tiny groundglass pulmonary nodules throughout both lungs unchanged, patchy consolidation from radiation, T5 fracture, no mets      04/15/2016 PET scan    Rt para-spinal mass mildy hypermetabolic SUV 5.9, lytic cortical lesion 4.8 cm lesion left prox femur suv 8.8 favor osseus met disease      04/28/2016 - 05/12/2016 Radiation Therapy    Palliative XRT Left femur      05/24/2016 Procedure    Soft tissue mass biopsy back: Metastatic breast cancer ER 80%, PR 0%, Ki-67 50%, HER-2 positive ratio 2.25      06/09/2016 -  Anti-estrogen oral therapy    Faslodex with Herceptin and Perjeta every 4 weeks      06/13/2016 - 06/15/2016 Hospital Admission    uncomplicated revision of previous thoracic instrumentation.      10/10/2016 PET scan    Interval development of new hypermetabolic 2.5 x 1.1 cm lesion posterior right eighth rib consistent with metastatic disease, stable right paraspinal lesion at T8, clustered soft tissue nodules in the posterior midline back near cervicothoracic junction smaller than before  11/03/2016 - 11/07/2016 Radiation Therapy    Palliative radiation to right eighth rib      11/04/2016 Imaging    Solitary contrast-enhancing lesion in the right cerebellum 6 x 2 mm concerning for metastatic lesion      11/24/2016 - 11/24/2016 Radiation Therapy    SRS to the brain lesion      04/18/2017 PET scan    New ill-defined rounded airspace opacity in posterior right lower lobe. Differential diagnosis includes malignancy and infectious/inflammatory process.Increased hypermetabolic lytic lesion in T8 vertebral body, consistent with bone metastasis.  Increased hypermetabolic posterior  upper chest wall subcutaneous nodules, highly suspicious metastatic disease. Further improvement in right posterior eighth rib metastasis.      05/04/2017 -  Chemotherapy    Kadcyla every 3 weeks palliative chemotherapy       CURRENT THERAPY: Kadcyla  INTERVAL HISTORY: Heather [name] 72 y.o. female returns for evaluation prior to receiving Kadcyla.  She is doing well today.  She gets an occasional burning in her foot.  She says [name] is going to reduce the dose of her Kadcyla today.  She has some knee pain in her knees bilaterally that has progressively worsened over the past few weeks..  She isn't walking as much as she could be due to the pain.  She says the lesions on her back have perhaps changed shapes.  Her husband tells her they are not as swollen.  She completed radiation to her back and also underwent MRI brain yesterday.  It was clear for residual/new lesions.  She continues to struggle with vaginal dryness.  She tried Replens, and she felt like it was irritating to her.  She uses coconut oil.     Patient Active Problem List   Diagnosis Date Noted  . Brain metastasis ([place]) 11/18/2016  . Genetic testing 07/11/2016  . ALLERGY TO CHLORHEXADINE, USE BETADINE ONLY 06/23/2016  . Kyphosis of thoracic region 06/13/2016  . Plantar fasciitis, bilateral 04/20/2016  . Neuropathic pain 01/27/2016  . Anemia of chronic disease 05/21/2015  . Metastatic cancer to spine ([place]) 03/27/2015  . Thrombocytopenia ([place]) 03/12/2015  . Prerenal azotemia 03/12/2015  . Pain   . Recurrent UTI--due to Klebsiella   . Paraplegia at T4 level ([place]) 02/20/2015  . Neurogenic bowel 02/20/2015  . Neurogenic bladder 02/20/2015  . Postoperative anemia due to acute blood loss 02/17/2015  . Spinal cord compression due to malignant neoplasm metastatic to spine ([place]) 02/15/2015  . Epidural mass 02/15/2015  . Thoracic spine tumor   . Pathologic fracture of vertebra 02/14/2015  . Pathologic fracture of thoracic  vertebrae 02/14/2015  . Breast cancer metastasized to bone, right ([place]) 02/14/2015  . Hyperlipidemia 02/14/2015  . H. pylori infection   . HTN (hypertension) 04/17/2012  . Primary cancer of upper outer quadrant of right female breast ([place]) 03/08/2012    is allergic to other; acyclovir and related; zanaflex [tizanidine hcl]; codeine; keflex [cephalexin]; naprosyn [naproxen]; oxycodone; tramadol; and tussin [guaifenesin].  MEDICAL HISTORY: Past Medical History:  Diagnosis Date  . Anemia    hx of  . Anxiety    r/t updated surgery  . Breast cancer ([place])    right  . Cataract    immature-not sure of which eye  . Complication of anesthesia    pt states she is sensitive to meds  . Dizziness    has been going on for 53month;medical MD aware  . Genetic testing 07/11/2016   Test Results: Normal; No pathogenic mutations detected  Genes Analyzed: 43 genes on [employer]'s Common Cancers panel (APC, ATM, AXIN2, BARD1, BMPR1A, BRCA1, BRCA2, BRIP1, CDH1, CDKN2A, CHEK2, DICER1, EPCAM, GREM1, HOXB13, KIT, MEN1, MLH1, MSH2, MSH6, MUTYH, NBN, NF1, PALB2, PDGFRA, PMS2, POLD1, POLE, PTEN, RAD50, RAD51C, RAD51D, SDHA, SDHB, SDHC, SDHD, SMAD4, SMARCA4, STK11, TP53, TSC1, TSC2, VHL).  Marland Kitchen GERD (gastroesophageal reflux disease)    Tums prn  . H. pylori infection   . H/O hiatal hernia   . Hemorrhoids   . History of UTI   . Hx of radiation therapy 10/25/12- 12/11/12   right chest wall/regional lymph nodes 5040 cGy, 28 sessions, right chest wall boost 1000 cGy 1 session  . Hx: UTI (urinary tract infection)   . Hyperlipidemia    but not on meds;diet and exercise controlled  . Hypertension    recently started Aldactone   . Insomnia    takes Melatoniin daily  . Metastasis to spinal column ([facility])    T5, Left  Femur cancer- radiation   . Neuropathy    "from back surgery"  . PONV (postoperative nausea and vomiting)    pt experienced hair loss, confusion and combative- 02/15/15  . Right knee pain   . Right shoulder  pain   . Skin cancer    squamous, Nodule to back - "same cancer as breast."  . Urinary frequency    d/t taking Aldactone    SURGICAL HISTORY: Past Surgical History:  Procedure Laterality Date  . COLONOSCOPY    . cyst removed from left breast  1970  . DECOMPRESSIVE LUMBAR LAMINECTOMY LEVEL 4 N/A 02/15/2015   Procedure: DECOMPRESSION T5 AND T3-T7 STABALIZATION;  Surgeon: Consuella Lose, MD;  Location: [facility];  Service: Neurosurgery;  Laterality: N/A;  . ESOPHAGOGASTRODUODENOSCOPY    . LAMINECTOMY N/A 03/27/2015   Procedure: Thoracic Four-Thoracic Six Laminectomy for tumor;  Surgeon: Consuella Lose, MD;  Location: [facility];  Service: Neurosurgery;  Laterality: N/A;  T4-T6 Laminectomy  . left wrist surgery   2004   with plate  . MASTECTOMY MODIFIED RADICAL  05/10/2012   Procedure: MASTECTOMY MODIFIED RADICAL;  Surgeon: Merrie Roof, MD;  Location: [facility];  Service: General;  Laterality: Right;  RIGHT MODIFIED RADICAL MASTECTOMY  . MASTECTOMY, RADICAL Right   . MINOR BREAST BIOPSY Left 05/16/2016   Procedure: EXCISION OF BACK NODULE;  Surgeon: Autumn Messing III, MD;  Location: [facility];  Service: General;  Laterality: Left;  . Nodule  back  05/17/2015  . PORTACATH PLACEMENT  05/10/2012   Procedure: INSERTION PORT-A-CATH;  Surgeon: Merrie Roof, MD;  Location[facility];  Service: General;  Laterality: Left;    SOCIAL HISTORY: Social History   Socioeconomic History  . Marital status: Married    Spouse name: Not on file  . Number of children: 2  . Years of education: Not on file  . Highest education level: Not on file  Social Needs  . Financial resource strain: Not on file  . Food insecurity - worry: Not on file  . Food insecurity - inability: Not on file  . Transportation needs - medical: Not on file  . Transportation needs - non-medical: Not on file  Occupational History  . Not on file  Tobacco Use  . Smoking status: Never Smoker  . Smokeless tobacco:  Never Used  Substance and Sexual Activity  . Alcohol use: No  . Drug use: No  . Sexual activity: Yes    Birth control/protection: Post-menopausal  Other Topics Concern  .  Not on file  Social History Narrative  . Not on file    FAMILY HISTORY: Family History  Problem Relation Age of Onset  . Leukemia Mother 39       deceased 36  . Stroke Father   . Diabetes Mellitus II Father   . Prostate cancer Brother 29       currently 80  . Stomach cancer Maternal Grandmother 42       deceased 3  . Prostate cancer Brother 10       currently 75    Review of Systems  Constitutional: Positive for fatigue (unchanged). Negative for appetite change, chills, fever and unexpected weight change.  HENT:   Negative for hearing loss and lump/mass.   Eyes: Negative for eye problems and icterus.  Respiratory: Negative for chest tightness, cough and shortness of breath.   Cardiovascular: Negative for chest pain, leg swelling and palpitations.  Gastrointestinal: Negative for abdominal distention and abdominal pain.  Endocrine: Negative for hot flashes.  Musculoskeletal: Positive for back pain (controlled, unchanged). Negative for arthralgias.  Skin: Negative for itching and rash.  Neurological: Negative for dizziness, extremity weakness, headaches and numbness.  Hematological: Negative for adenopathy. Does not bruise/bleed easily.  Psychiatric/Behavioral: Negative for depression. The patient is not nervous/anxious.        PHYSICAL EXAMINATION  ECOG PERFORMANCE STATUS: 2 - Symptomatic, <50% confined to bed  Vitals:   06/16/17 0823  BP: (!) 153/80  Pulse: 76  Resp: 17  Temp: 97.7 F (36.5 C)  SpO2: 100%    Physical Exam  Constitutional: She is oriented to person, place, and time and well-developed, well-nourished, and in no distress.  HENT:  Head: Normocephalic and atraumatic.  Mouth/Throat: Oropharynx is clear and moist. No oropharyngeal exudate.  Eyes: Pupils are equal, round, and  reactive to light. No scleral icterus.  Neck: Neck supple.  Cardiovascular: Normal rate, regular rhythm and normal heart sounds.  Pulmonary/Chest: Effort normal and breath sounds normal. No respiratory distress. She has no wheezes.  Abdominal: Soft. Bowel sounds are normal. She exhibits no distension and no mass. There is no tenderness. There is no rebound and no guarding.  Musculoskeletal: She exhibits no edema.  Lymphadenopathy:    She has no cervical adenopathy.  Neurological: She is alert and oriented to person, place, and time.  Skin: Skin is warm and dry. No rash noted.  Continued lesions on back.  They feel slightly softer.  Unsure if they are indeed smaller than originally.   Psychiatric: Mood and affect normal.    LABORATORY DATA:  CBC    Component Value Date/Time   WBC 4.7 06/16/2017 0741   RBC 4.49 06/16/2017 0741   HGB 12.0 06/16/2017 0741   HGB 12.0 05/05/2017 1046   HCT 37.1 06/16/2017 0741   HCT 36.9 05/05/2017 1046   PLT 231 06/16/2017 0741   PLT 210 05/05/2017 1046   MCV 82.6 06/16/2017 0741   MCV 83.1 05/05/2017 1046   MCH 26.7 06/16/2017 0741   MCHC 32.3 06/16/2017 0741   RDW 14.3 06/16/2017 0741   RDW 14.8 (H) 05/05/2017 1046   LYMPHSABS 0.3 (L) 06/16/2017 0741   LYMPHSABS 0.6 (L) 05/05/2017 1046   MONOABS 0.6 06/16/2017 0741   MONOABS 0.3 05/05/2017 1046   EOSABS 0.2 06/16/2017 0741   EOSABS 0.1 05/05/2017 1046   BASOSABS 0.0 06/16/2017 0741   BASOSABS 0.1 05/05/2017 1046    CMP     Component Value Date/Time   NA 136 06/16/2017 0741  NA 137 05/05/2017 1046   K 4.0 06/16/2017 0741   K 3.9 05/05/2017 1046   CL 100 06/16/2017 0741   CL 101 10/25/2012 0946   CO2 27 06/16/2017 0741   CO2 26 05/05/2017 1046   GLUCOSE 83 06/16/2017 0741   GLUCOSE 88 05/05/2017 1046   GLUCOSE 90 10/25/2012 0946   BUN 10 06/16/2017 0741   BUN 14.1 05/05/2017 1046   CREATININE 0.76 06/16/2017 0741   CREATININE 0.8 05/05/2017 1046   CALCIUM 10.3 06/16/2017 0741    CALCIUM 10.3 05/05/2017 1046   PROT 7.4 06/16/2017 0741   PROT 7.2 05/05/2017 1046   ALBUMIN 3.4 (L) 06/16/2017 0741   ALBUMIN 3.7 05/05/2017 1046   AST 34 06/16/2017 0741   AST 26 05/05/2017 1046   ALT 25 06/16/2017 0741   ALT 17 05/05/2017 1046   ALKPHOS 91 06/16/2017 0741   ALKPHOS 74 05/05/2017 1046   BILITOT 0.4 06/16/2017 0741   BILITOT 0.43 05/05/2017 1046   GFRNONAA >60 06/16/2017 0741   GFRAA >60 06/16/2017 0741         RADIOGRAPHIC STUDIES:  Mr Jeri Cos OB Contrast  Result Date: 06/15/2017 CLINICAL DATA:  Metastatic breast cancer status post stereotactic radiosurgery. EXAM: MRI HEAD WITHOUT AND WITH CONTRAST TECHNIQUE: Multiplanar, multiecho pulse sequences of the brain and surrounding structures were obtained without and with intravenous contrast. CONTRAST:  19m MULTIHANCE GADOBENATE DIMEGLUMINE 529 MG/ML IV SOLN COMPARISON:  Brain MRI 03/15/2017 and 11/16/2016 FINDINGS: Brain: The midline structures are normal. No abnormal diffusion restriction. Unchanged areas of hyperintense T2-weighted signal within the bilateral subcortical and deep white matter. No acute or chronic hemorrhage. There are no contrast-enhancing lesions. There is no residual abnormality at the site of the treated right cerebellar lesion. Vascular: Major intracranial arterial and venous sinus flow voids are preserved. Skull and upper cervical spine: Unchanged appearance of contrast-enhancing lesion in the right frontal bone. Sinuses/Orbits: No fluid levels or advanced mucosal thickening. No mastoid or middle ear effusion. Normal orbits. IMPRESSION: Unchanged examination without new or residual brain parenchymal metastatic disease. Electronically Signed   By: KUlyses JarredM.D.   On: 06/15/2017 14:38         ASSESSMENT and THERAPY PLAN:   [place] of upper outer quadrant of right [place] breast [place] Right breast invasive ductal carcinoma ER positive PR negative HER-2 positive Ki-67 44%  multifocal disease 3/7 lymph nodes positive T2 N1 M0 stage IIB status post adjuvant chemotherapy with TCH followed by Herceptin maintenance, adjuvant radiation therapy and Letrozole. MRI 02/14/2015: T5 large destructive lesion with pathologic compression fracture and extensive epidural tumor, involvement of T4, excision metastatic carcinoma ER 60%, PR 0%, HER-2 positive ratio 2.71 average copy #7.45 03/27/2015: T4-6 decompression surgery for spinal cord compression PET-CT 12/10/17Rt para-spinal mass mildy hypermetabolic SUV 5.9, lytic cortical lesion 4.8 cm lesion left prox femur suv 8.8 favor osseus met disease Echo 04/12/2017 shows well preserved ejection fraction of 55-60%   Treatment summary: 1. Resumed anastrozole 05/21/2015, but it has caused significant hair loss so we switched her to exemestane 25 mg once daily 07/23/2015 2. Herceptin every 3 week started 04/10/2015 3. Bone metastases: Zometa every 3 months. 4. Radiation therapy to the spine (Dr. MTammi Klippel 04/13/2015 to 05/19/2015 5. Radiation therapy to left femur 04/28/2016 to 05/12/2016  6. SRS to the brain lesion 11/24/2016 -------------------------------------------------------------------------------------------------------------------------- Goal of treatment: Palliation Treatment plan:Kadcyla 05/05/2017 every 3 weeks Chemo toxicities: She had burning of her feet after last cycle, Kadcyla further dose reduced today by  [name].   Knee pain: xrays of both knees after kadcyla She will f/u with radiation oncology on Tuesday.    Bone metastases: Zometa every 3 months with calcium and vitamin D (due again in March) Return to clinic in 3 weeks for cycle 4    Orders Placed This Encounter  Procedures  . DG Knee 3 Views Left    Standing Status:   Future    Standing Expiration Date:   08/15/2018    Order Specific Question:   Reason for Exam (SYMPTOM  OR DIAGNOSIS REQUIRED)    Answer:   knee pain, metastatic breast cancer     Order Specific Question:   Preferred imaging location?    Answer:   [facility]    Order Specific Question:   Radiology Contrast Protocol - do NOT remove file path    Answer:   [email or web address]  . DG Knee 3 Views Right    Standing Status:   Future    Standing Expiration Date:   08/15/2018    Order Specific Question:   Reason for Exam (SYMPTOM  OR DIAGNOSIS REQUIRED)    Answer:   knee pain, metastatic breast cancer    Order Specific Question:   Preferred imaging location?    Answer:   [facility]    All questions were answered. The patient knows to call the clinic with any problems, questions or concerns. We can certainly see the patient much sooner if necessary.  A total of (30) minutes of face-to-face time was spent with this patient with greater than 50% of that time in counseling and care-coordination.  This note was electronically signed. Scot Dock, NP 06/16/2017

## 2017-06-16 ENCOUNTER — Other Ambulatory Visit: Payer: Self-pay | Admitting: Adult Health

## 2017-06-16 ENCOUNTER — Inpatient Hospital Stay: Payer: Medicare Other

## 2017-06-16 ENCOUNTER — Encounter: Payer: Self-pay | Admitting: Adult Health

## 2017-06-16 ENCOUNTER — Inpatient Hospital Stay (HOSPITAL_BASED_OUTPATIENT_CLINIC_OR_DEPARTMENT_OTHER): Payer: Medicare Other | Admitting: Adult Health

## 2017-06-16 ENCOUNTER — Ambulatory Visit (HOSPITAL_COMMUNITY)
Admission: RE | Admit: 2017-06-16 | Discharge: 2017-06-16 | Disposition: A | Discharge status: Home / Self Care | Payer: Medicare Other | Source: Ambulatory Visit | Attending: Adult Health | Admitting: Adult Health

## 2017-06-16 ENCOUNTER — Inpatient Hospital Stay: Payer: Medicare Other | Attending: Hematology and Oncology

## 2017-06-16 VITALS — BP 153/80 | HR 76 | Temp 97.7°F | Resp 17 | Ht 64.0 in | Wt 131.4 lb

## 2017-06-16 VITALS — BP 123/63 | HR 75 | Temp 97.8°F | Resp 17

## 2017-06-16 DIAGNOSIS — C50911 Malignant neoplasm of unspecified site of right female breast: Secondary | ICD-10-CM

## 2017-06-16 DIAGNOSIS — C7951 Secondary malignant neoplasm of bone: Secondary | ICD-10-CM | POA: Diagnosis not present

## 2017-06-16 DIAGNOSIS — Z87440 Personal history of urinary (tract) infections: Secondary | ICD-10-CM | POA: Insufficient documentation

## 2017-06-16 DIAGNOSIS — M4804 Spinal stenosis, thoracic region: Secondary | ICD-10-CM | POA: Insufficient documentation

## 2017-06-16 DIAGNOSIS — M25562 Pain in left knee: Principal | ICD-10-CM

## 2017-06-16 DIAGNOSIS — Z79811 Long term (current) use of aromatase inhibitors: Secondary | ICD-10-CM | POA: Diagnosis not present

## 2017-06-16 DIAGNOSIS — G9529 Other cord compression: Secondary | ICD-10-CM

## 2017-06-16 DIAGNOSIS — G9520 Unspecified cord compression: Secondary | ICD-10-CM | POA: Diagnosis not present

## 2017-06-16 DIAGNOSIS — C7931 Secondary malignant neoplasm of brain: Secondary | ICD-10-CM | POA: Insufficient documentation

## 2017-06-16 DIAGNOSIS — E785 Hyperlipidemia, unspecified: Secondary | ICD-10-CM | POA: Insufficient documentation

## 2017-06-16 DIAGNOSIS — Z5112 Encounter for antineoplastic immunotherapy: Secondary | ICD-10-CM | POA: Diagnosis not present

## 2017-06-16 DIAGNOSIS — G8220 Paraplegia, unspecified: Secondary | ICD-10-CM | POA: Insufficient documentation

## 2017-06-16 DIAGNOSIS — Z9011 Acquired absence of right breast and nipple: Secondary | ICD-10-CM

## 2017-06-16 DIAGNOSIS — I10 Essential (primary) hypertension: Secondary | ICD-10-CM | POA: Diagnosis not present

## 2017-06-16 DIAGNOSIS — Z9221 Personal history of antineoplastic chemotherapy: Secondary | ICD-10-CM | POA: Insufficient documentation

## 2017-06-16 DIAGNOSIS — Z170 Estrogen receptor positive status [ER+]: Secondary | ICD-10-CM

## 2017-06-16 DIAGNOSIS — M25561 Pain in right knee: Secondary | ICD-10-CM | POA: Insufficient documentation

## 2017-06-16 DIAGNOSIS — C50411 Malignant neoplasm of upper-outer quadrant of right female breast: Secondary | ICD-10-CM

## 2017-06-16 DIAGNOSIS — Z923 Personal history of irradiation: Secondary | ICD-10-CM | POA: Diagnosis not present

## 2017-06-16 DIAGNOSIS — F419 Anxiety disorder, unspecified: Secondary | ICD-10-CM | POA: Diagnosis not present

## 2017-06-16 DIAGNOSIS — M722 Plantar fascial fibromatosis: Secondary | ICD-10-CM | POA: Diagnosis not present

## 2017-06-16 LAB — CBC WITH DIFFERENTIAL/PLATELET
Basophils Absolute: 0 10*3/uL (ref 0.0–0.1)
Basophils Relative: 1 %
Eosinophils Absolute: 0.2 10*3/uL (ref 0.0–0.5)
Eosinophils Relative: 4 %
HCT: 37.1 % (ref 34.8–46.6)
Hemoglobin: 12 g/dL (ref 11.6–15.9)
Lymphocytes Relative: 7 %
Lymphs Abs: 0.3 10*3/uL — ABNORMAL LOW (ref 0.9–3.3)
MCH: 26.7 pg (ref 25.1–34.0)
MCHC: 32.3 g/dL (ref 31.5–36.0)
MCV: 82.6 fL (ref 79.5–101.0)
Monocytes Absolute: 0.6 10*3/uL (ref 0.1–0.9)
Monocytes Relative: 13 %
Neutro Abs: 3.5 10*3/uL (ref 1.5–6.5)
Neutrophils Relative %: 75 %
Platelets: 231 10*3/uL (ref 145–400)
RBC: 4.49 MIL/uL (ref 3.70–5.45)
RDW: 14.3 % (ref 11.2–14.5)
WBC: 4.7 10*3/uL (ref 3.9–10.3)

## 2017-06-16 LAB — COMPREHENSIVE METABOLIC PANEL
ALT: 25 U/L (ref 0–55)
AST: 34 U/L (ref 5–34)
Albumin: 3.4 g/dL — ABNORMAL LOW (ref 3.5–5.0)
Alkaline Phosphatase: 91 U/L (ref 40–150)
Anion gap: 9 (ref 3–11)
BUN: 10 mg/dL (ref 7–26)
CO2: 27 mmol/L (ref 22–29)
Calcium: 10.3 mg/dL (ref 8.4–10.4)
Chloride: 100 mmol/L (ref 98–109)
Creatinine, Ser: 0.76 mg/dL (ref 0.60–1.10)
GFR calc Af Amer: >60 mL/min (ref >60)
GFR calc non Af Amer: >60 mL/min (ref >60)
Glucose, Bld: 83 mg/dL (ref 70–140)
Potassium: 4 mmol/L (ref 3.5–5.1)
Sodium: 136 mmol/L (ref 136–145)
Total Bilirubin: 0.4 mg/dL (ref 0.2–1.2)
Total Protein: 7.4 g/dL (ref 6.4–8.3)

## 2017-06-16 MED ORDER — ACETAMINOPHEN 325 MG PO TABS
ORAL_TABLET | ORAL | Status: AC
  Filled 2017-06-16: qty 2

## 2017-06-16 MED ORDER — HEPARIN SOD (PORK) LOCK FLUSH 100 UNIT/ML IV SOLN
500.0000 [IU] | Freq: Once | INTRAVENOUS | Status: AC | PRN
  Administered 2017-06-16: 500 [IU]
  Filled 2017-06-16: qty 5

## 2017-06-16 MED ORDER — SODIUM CHLORIDE 0.9 % IJ SOLN
10.0000 mL | INTRAMUSCULAR | Status: DC | PRN
  Administered 2017-06-16: 10 mL via INTRAVENOUS
  Filled 2017-06-16: qty 10

## 2017-06-16 MED ORDER — ACETAMINOPHEN 325 MG PO TABS
650.0000 mg | ORAL_TABLET | Freq: Once | ORAL | Status: AC
  Administered 2017-06-16: 650 mg via ORAL

## 2017-06-16 MED ORDER — ADO-TRASTUZUMAB EMTANSINE CHEMO INJECTION 160 MG
2.5000 mg/kg | Freq: Once | INTRAVENOUS | Status: AC
  Administered 2017-06-16: 160 mg via INTRAVENOUS
  Filled 2017-06-16: qty 8

## 2017-06-16 MED ORDER — SODIUM CHLORIDE 0.9 % IV SOLN
Freq: Once | INTRAVENOUS | Status: AC
  Administered 2017-06-16: 10:00:00 via INTRAVENOUS

## 2017-06-16 MED ORDER — SODIUM CHLORIDE 0.9% FLUSH
10.0000 mL | INTRAVENOUS | Status: DC | PRN
  Administered 2017-06-16: 10 mL
  Filled 2017-06-16: qty 10

## 2017-06-16 MED ORDER — DIPHENHYDRAMINE HCL 25 MG PO CAPS
ORAL_CAPSULE | ORAL | Status: AC
  Filled 2017-06-16: qty 1

## 2017-06-16 MED ORDER — DIPHENHYDRAMINE HCL 25 MG PO CAPS
50.0000 mg | ORAL_CAPSULE | Freq: Once | ORAL | Status: AC
  Administered 2017-06-16: 25 mg via ORAL

## 2017-06-16 NOTE — Patient Instructions (Signed)
[facility] Discharge Instructions for Patients Receiving Chemotherapy  Today you received the following chemotherapy agents Ado-Trastuzumab  To help prevent nausea and vomiting after your treatment, we encourage you to take your nausea medication as directed   If you develop nausea and vomiting that is not controlled by your nausea medication, call the clinic.   BELOW ARE SYMPTOMS THAT SHOULD BE REPORTED IMMEDIATELY:  *FEVER GREATER THAN 100.5 F  *CHILLS WITH OR WITHOUT FEVER  NAUSEA AND VOMITING THAT IS NOT CONTROLLED WITH YOUR NAUSEA MEDICATION  *UNUSUAL SHORTNESS OF BREATH  *UNUSUAL BRUISING OR BLEEDING  TENDERNESS IN MOUTH AND THROAT WITH OR WITHOUT PRESENCE OF ULCERS  *URINARY PROBLEMS  *BOWEL PROBLEMS  UNUSUAL RASH Items with * indicate a potential emergency and should be followed up as soon as possible.  Feel free to call the clinic should you have any questions or concerns. The clinic phone number is [phone].  Please show the [place] at check-in to the Emergency Department and triage nurse.

## 2017-06-19 ENCOUNTER — Telehealth: Payer: Self-pay

## 2017-06-19 DIAGNOSIS — T84498D Other mechanical complication of other internal orthopedic devices, implants and grafts, subsequent encounter: Secondary | ICD-10-CM | POA: Diagnosis not present

## 2017-06-19 NOTE — Telephone Encounter (Signed)
-----   Message from [name], NP sent at 06/19/2017 10:19 AM EST ----- Will you tell patient xrays of knees are normal?  Not sure if we told her Friday or not.  She should see ortho about her knee pain.   ----- Message ----- From: Interface, Rad Results In Sent: 06/16/2017   2:45 PM To: [name], NP

## 2017-06-19 NOTE — Telephone Encounter (Signed)
Spoke with patient regarding normal X-rays of knees.  NP recommends that patient see ortho for pain.  She voiced understanding and thanked nurse for call.  No questions or concerns at this time.

## 2017-06-20 ENCOUNTER — Encounter: Payer: Self-pay | Admitting: Urology

## 2017-06-20 ENCOUNTER — Other Ambulatory Visit: Payer: Self-pay

## 2017-06-20 ENCOUNTER — Ambulatory Visit
Admission: RE | Admit: 2017-06-20 | Discharge: 2017-06-20 | Disposition: A | Discharge status: Home / Self Care | Payer: Medicare Other | Source: Ambulatory Visit | Attending: Urology | Admitting: Urology

## 2017-06-20 VITALS — BP 140/73 | HR 82 | Temp 98.0°F | Resp 18 | Ht 64.0 in | Wt 130.0 lb

## 2017-06-20 DIAGNOSIS — F419 Anxiety disorder, unspecified: Secondary | ICD-10-CM | POA: Diagnosis not present

## 2017-06-20 DIAGNOSIS — M25561 Pain in right knee: Secondary | ICD-10-CM | POA: Diagnosis not present

## 2017-06-20 DIAGNOSIS — Z79899 Other long term (current) drug therapy: Secondary | ICD-10-CM | POA: Insufficient documentation

## 2017-06-20 DIAGNOSIS — K769 Liver disease, unspecified: Secondary | ICD-10-CM | POA: Insufficient documentation

## 2017-06-20 DIAGNOSIS — Z8719 Personal history of other diseases of the digestive system: Secondary | ICD-10-CM | POA: Diagnosis not present

## 2017-06-20 DIAGNOSIS — E785 Hyperlipidemia, unspecified: Secondary | ICD-10-CM | POA: Insufficient documentation

## 2017-06-20 DIAGNOSIS — G4700 Insomnia, unspecified: Secondary | ICD-10-CM | POA: Insufficient documentation

## 2017-06-20 DIAGNOSIS — Z8042 Family history of malignant neoplasm of prostate: Secondary | ICD-10-CM | POA: Diagnosis not present

## 2017-06-20 DIAGNOSIS — Z87440 Personal history of urinary (tract) infections: Secondary | ICD-10-CM | POA: Insufficient documentation

## 2017-06-20 DIAGNOSIS — C7951 Secondary malignant neoplasm of bone: Secondary | ICD-10-CM

## 2017-06-20 DIAGNOSIS — D649 Anemia, unspecified: Secondary | ICD-10-CM | POA: Diagnosis not present

## 2017-06-20 DIAGNOSIS — Z923 Personal history of irradiation: Secondary | ICD-10-CM | POA: Diagnosis not present

## 2017-06-20 DIAGNOSIS — G629 Polyneuropathy, unspecified: Secondary | ICD-10-CM | POA: Insufficient documentation

## 2017-06-20 DIAGNOSIS — Z85828 Personal history of other malignant neoplasm of skin: Secondary | ICD-10-CM | POA: Insufficient documentation

## 2017-06-20 DIAGNOSIS — Z806 Family history of leukemia: Secondary | ICD-10-CM | POA: Insufficient documentation

## 2017-06-20 DIAGNOSIS — Z170 Estrogen receptor positive status [ER+]: Secondary | ICD-10-CM | POA: Diagnosis not present

## 2017-06-20 DIAGNOSIS — R350 Frequency of micturition: Secondary | ICD-10-CM | POA: Insufficient documentation

## 2017-06-20 DIAGNOSIS — M25511 Pain in right shoulder: Secondary | ICD-10-CM | POA: Diagnosis not present

## 2017-06-20 DIAGNOSIS — R42 Dizziness and giddiness: Secondary | ICD-10-CM | POA: Diagnosis not present

## 2017-06-20 DIAGNOSIS — K219 Gastro-esophageal reflux disease without esophagitis: Secondary | ICD-10-CM | POA: Diagnosis not present

## 2017-06-20 DIAGNOSIS — R5383 Other fatigue: Secondary | ICD-10-CM | POA: Diagnosis not present

## 2017-06-20 DIAGNOSIS — C50411 Malignant neoplasm of upper-outer quadrant of right female breast: Secondary | ICD-10-CM | POA: Insufficient documentation

## 2017-06-20 DIAGNOSIS — Z9012 Acquired absence of left breast and nipple: Secondary | ICD-10-CM | POA: Diagnosis not present

## 2017-06-20 DIAGNOSIS — Z800 Family history of malignant neoplasm of digestive organs: Secondary | ICD-10-CM | POA: Insufficient documentation

## 2017-06-20 DIAGNOSIS — I10 Essential (primary) hypertension: Secondary | ICD-10-CM | POA: Insufficient documentation

## 2017-06-20 NOTE — Addendum Note (Signed)
Encounter addended by: Freeman Caldron, PA-C on: 06/20/2017 5:27 PM  Actions taken: Follow-up modified

## 2017-06-20 NOTE — Progress Notes (Addendum)
Radiation Oncology         [phone] ________________________________  Name: Heather [name] MRN: [id number]  Date: 06/20/2017  DOB: August 11, 1945  Post-treatment Note  CC: [name], MD  [name], MD  Diagnosis:   72 y.o. woman with ER positive, PR negative, HER-2 positive metastatic breast cancer with painful T8 Spine metastasis.  Interval Since Last Radiation: 2.5 weeks   05/18/2017 - 05/31/2017:  The thoracic spine (T7-T9) was treated to 30 Gy in 10 fractions of 3 Gy.  6/27-7/18/18 SRS and palliative treatment: 1.  The rib 8th was treated to 30 Gy in 10 fractions 2.  Right cerebellar 6 mm target was treated using 3 Dynamic Conformal Arcs to a prescription dose of 20 Gy in one fraction  04/27/16-05/12/16: Left femur/ 30 Gy in 10 fractions  04/13/2015-05/19/2015 Postop:  55 Gy in 25 fractions of 2.2 Gy to the thoracic spine  10/25/12-12/11/12: 50.4 Gy to the right chest wall and regional lymph nodes in 28 fractions with a 10 Gy boost to the right chest wall in 1 session.  Narrative: Heather [name] is a well known patient to our service with a history of metastatic breast cancer originally treated in 2014 to the chest wall and regional lymph nodes. She developed recurrent disease within the thoracic spine and subsequently has undergone 3 surgeries, in addition to radiotherapy, with her most recent surgery being on 06/13/2016 where she had removal of thoracic hardware and replacement of pedicle screws with stabilization.  Of note previously placed screws between T3, T4 T6 and T7 were identified and dissection was carried out inferiorly to T8, T9-T10 and T11, T8 through T11 pedicle screw trajectories were placed bilaterally. She recovered well since treatment to her spine as she had been paralyzed in 2016, and with rigorous therapy has been able to make an impressive recovery. She continues on systemic therapy under the care of [name] and also follows along in the brain and spine  oncology conference. She has been on Faslodex, Perjeta, and Herceptin since March of 2018, and in November, was noted to have some progressive nodular enhancing soft tissue masses in the subcutaneous tissues posteriorly at the level of prior surgery and felt compatible with metastatic disease. She also had a new cerebellar lesion on MRI in November and underwent SRS to this site as well as palliative treatment to her  right eighth rib. She was having increased pain in the T8 region and also had  PET positive findings in this region, as well as a new right lower lobe mass concerning for metastatic disease.   Her systemic therapy was recently switched  to [place] on 05/05/17 with discontinuation of Faslodex, Herceptin, and Perjeta. She is tolerating the Kadcyla well- has completed 3 cycles to date. She also remains on Zometa q 3 month for bone metastasis.  She recently completed her course of palliative radiotherapy to the thoracic spine which she tolerated relatively well.   She experienced mild fatigue and some esophagitis with a cough as well as occasional nausea and decreased appetite. She denied diarrhea but did have some constipation, relieved by miralax. She had mild hyperpigmentation in the treatment field but no desquamation or skin breakdown.   She presents today in routine follow up and to review results of her recent brain MRI performed 06/15/17.  This study shows no new or progressive contrast enhancing lesions and no residual abnormality at the site of the previous treated right cerebellar lesion.  On review of systems, the  patient reports that she is doing well overall.  She continues with modest fatigue which is gradually improving.  She has also noticed improvement in the burning/tingling pain that radiates from the left aspect of the spine at the T8-9 site and radiates underneath her breast since completing radiotherapy.  She has not had recent headaches, changes in visual or auditory acuity,  tinnitus, increased imbalance, tremor or seizure activity.  She denies any sternal chest pain, shortness of breath, cough, fevers, chills, night sweats, or unintended weight changes. She denies any bowel or bladder disturbances, and denies abdominal pain, nausea or vomiting. She denies any new musculoskeletal or joint aches or pains, new skin lesions or concerns. A complete review of systems is obtained and is otherwise negative.  Past Medical History:  Past Medical History:  Diagnosis Date  . Anemia    hx of  . Anxiety    r/t updated surgery  . Breast cancer ([place])    right  . Cataract    immature-not sure of which eye  . Complication of anesthesia    pt states she is sensitive to meds  . Dizziness    has been going on for 44month;medical MD aware  . Genetic testing 07/11/2016   Test Results: Normal; No pathogenic mutations detected  Genes Analyzed: 43 genes on [employer] Common Cancers panel (APC, ATM, AXIN2, BARD1, BMPR1A, BRCA1, BRCA2, BRIP1, CDH1, CDKN2A, CHEK2, DICER1, EPCAM, GREM1, HOXB13, KIT, MEN1, MLH1, MSH2, MSH6, MUTYH, NBN, NF1, PALB2, PDGFRA, PMS2, POLD1, POLE, PTEN, RAD50, RAD51C, RAD51D, SDHA, SDHB, SDHC, SDHD, SMAD4, SMARCA4, STK11, TP53, TSC1, TSC2, VHL).  .Marland KitchenGERD (gastroesophageal reflux disease)    Tums prn  . H. pylori infection   . H/O hiatal hernia   . Hemorrhoids   . History of UTI   . Hx of radiation therapy 10/25/12- 12/11/12   right chest wall/regional lymph nodes 5040 cGy, 28 sessions, right chest wall boost 1000 cGy 1 session  . Hx: UTI (urinary tract infection)   . Hyperlipidemia    but not on meds;diet and exercise controlled  . Hypertension    recently started Aldactone   . Insomnia    takes Melatoniin daily  . Metastasis to spinal column ([facility])    T5, Left  Femur cancer- radiation   . Neuropathy    "from back surgery"  . PONV (postoperative nausea and vomiting)    pt experienced hair loss, confusion and combative- 02/15/15  . Right knee pain   . Right  shoulder pain   . Skin cancer    squamous, Nodule to back - "same cancer as breast."  . Urinary frequency    d/t taking Aldactone    Past Surgical History: Past Surgical History:  Procedure Laterality Date  . COLONOSCOPY    . cyst removed from left breast  1970  . DECOMPRESSIVE LUMBAR LAMINECTOMY LEVEL 4 N/A 02/15/2015   Procedure: DECOMPRESSION T5 AND T3-T7 STABALIZATION;  Surgeon: NConsuella Lose MD;  [facility];  Service: Neurosurgery;  Laterality: N/A;  . ESOPHAGOGASTRODUODENOSCOPY    . LAMINECTOMY N/A 03/27/2015   Procedure: Thoracic Four-Thoracic Six Laminectomy for tumor;  Surgeon: NConsuella Lose MD;  [facility];  Service: Neurosurgery;  Laterality: N/A;  T4-T6 Laminectomy  . left wrist surgery   2004   with plate  . MASTECTOMY MODIFIED RADICAL  05/10/2012   Procedure: MASTECTOMY MODIFIED RADICAL;  Surgeon: PMerrie Roof MD;  [facility]  Service: General;  Laterality: Right;  RIGHT MODIFIED RADICAL  MASTECTOMY  . MASTECTOMY, RADICAL Right   . MINOR BREAST BIOPSY Left 05/16/2016   Procedure: EXCISION OF BACK NODULE;  Surgeon: Autumn Messing III, MD;  Location: [facility];  Service: General;  Laterality: Left;  . Nodule  back  05/17/2015  . PORTACATH PLACEMENT  05/10/2012   Procedure: INSERTION PORT-A-CATH;  Surgeon: Merrie Roof, MD;  [facility];  Service: General;  Laterality: Left;    Social History:  Social History   Socioeconomic History  . Marital status: Married    Spouse name: Not on file  . Number of children: 2  . Years of education: Not on file  . Highest education level: Not on file  Social Needs  . Financial resource strain: Not on file  . Food insecurity - worry: Not on file  . Food insecurity - inability: Not on file  . Transportation needs - medical: Not on file  . Transportation needs - non-medical: Not on file  Occupational History  . Not on file  Tobacco Use  . Smoking status: Never Smoker  .  Smokeless tobacco: Never Used  Substance and Sexual Activity  . Alcohol use: No  . Drug use: No  . Sexual activity: Yes    Birth control/protection: Post-menopausal  Other Topics Concern  . Not on file  Social History Narrative  . Not on file    Family History: Family History  Problem Relation Age of Onset  . Leukemia Mother 80       deceased 78  . Stroke Father   . Diabetes Mellitus II Father   . Prostate cancer Brother 30       currently 21  . Stomach cancer Maternal Grandmother 110       deceased 84  . Prostate cancer Brother 40       currently 65     ALLERGIES:  is allergic to other; acyclovir and related; zanaflex [tizanidine hcl]; codeine; keflex [cephalexin]; naprosyn [naproxen]; oxycodone; tramadol; and tussin [guaifenesin].  Meds: Current Outpatient Medications  Medication Sig Dispense Refill  . ALPRAZolam (XANAX) 0.25 MG tablet Take 1 tablet by mouth as needed.    Marland Kitchen b complex vitamins tablet Take 1 tablet by mouth daily.    . Calcium Carbonate Antacid (TUMS PO) Take 2 tablets by mouth 2 (two) times daily as needed (acid reflux).    . Cholecalciferol (VITAMIN D) 2000 UNITS tablet Take 2,000 Units by mouth daily.    . Coenzyme Q10 (COQ10) 100 MG CAPS Take 100 mg by mouth as needed. Days that she takes pravastatin MWF.    Marland Kitchen fluticasone (FLONASE) 50 MCG/ACT nasal spray Place 1 spray into both nostrils daily as needed.    . gabapentin (NEURONTIN) 600 MG tablet Take 1 tablet (600 mg total) by mouth as directed. Take 1 tablet in the AM and afternoon and 1 1/2 tabs at bedtime (Patient taking differently: Take 600 mg by mouth 3 (three) times daily. Take 1 tablet in the AM and afternoon and 1 1/2 tabs at bedtime) 315 tablet 2  . HYDROcodone-acetaminophen (NORCO/VICODIN) 5-325 MG tablet     . lidocaine-prilocaine (EMLA) cream Apply to affected area once (Patient taking differently: Apply 1 application topically every 30 (thirty) days. Apply to port prior to infusions) 30 g 3    . OVER THE COUNTER MEDICATION Place 1 drop into both eyes 2 (two) times daily as needed (dry eyes). Over the counter lubricating eye drops    . ranitidine (ZANTAC) 300 MG tablet  Take 300 mg by mouth at bedtime. Reported on 06/25/2015    . spironolactone (ALDACTONE) 25 MG tablet Take 25 mg by mouth daily.    . pravastatin (PRAVACHOL) 10 MG tablet Take 10 mg by mouth every Monday, Wednesday, and Friday.     . Probiotic Product (PROBIOTIC DAILY PO) Take 1 capsule by mouth daily.      No current facility-administered medications for this encounter.    Facility-Administered Medications Ordered in Other Encounters  Medication Dose Route Frequency Provider Last Rate Last Dose  . sodium chloride 0.9 % injection 10 mL  10 mL Intravenous PRN [name], MD   10 mL at 04/26/17 0177    Physical Findings:  height is '5\' 4"'  (1.626 m) and weight is 130 lb (59 kg). Her oral temperature is 98 F (36.7 C). Her blood pressure is 140/73 and her pulse is 82. Her respiration is 18 and oxygen saturation is 100%.  Pain Assessment Pain Score: 0-No pain/10 In general this is a well appearing Caucasian female in no acute distress. She's alert and oriented x4 and appropriate throughout the examination. Cardiopulmonary assessment is negative for acute distress and she exhibits normal effort. She appears grossly neurologically intact.  PERRLA.  EOMs are intact. Speech is clear and well organized, without evidence of difficulty with word finding.  There are palpable subcutaneous nodules bilaterally along T3-T4 and bilaterally at T8-T9.     Lab Findings: Lab Results  Component Value Date   WBC 4.7 06/16/2017   HGB 12.0 06/16/2017   HCT 37.1 06/16/2017   MCV 82.6 06/16/2017   PLT 231 06/16/2017     Radiographic Findings: Mr Jeri Cos LT Contrast  Result Date: 06/15/2017 CLINICAL DATA:  Metastatic breast cancer status post stereotactic radiosurgery. EXAM: MRI HEAD WITHOUT AND WITH CONTRAST TECHNIQUE: Multiplanar,  multiecho pulse sequences of the brain and surrounding structures were obtained without and with intravenous contrast. CONTRAST:  44m MULTIHANCE GADOBENATE DIMEGLUMINE 529 MG/ML IV SOLN COMPARISON:  Brain MRI 03/15/2017 and 11/16/2016 FINDINGS: Brain: The midline structures are normal. No abnormal diffusion restriction. Unchanged areas of hyperintense T2-weighted signal within the bilateral subcortical and deep white matter. No acute or chronic hemorrhage. There are no contrast-enhancing lesions. There is no residual abnormality at the site of the treated right cerebellar lesion. Vascular: Major intracranial arterial and venous sinus flow voids are preserved. Skull and upper cervical spine: Unchanged appearance of contrast-enhancing lesion in the right frontal bone. Sinuses/Orbits: No fluid levels or advanced mucosal thickening. No mastoid or middle ear effusion. Normal orbits. IMPRESSION: Unchanged examination without new or residual brain parenchymal metastatic disease. Electronically Signed   By: KUlyses JarredM.D.   On: 06/15/2017 14:38   Dg Knee Complete 4 Views Left  Result Date: 06/16/2017 CLINICAL DATA:  Knee pain, no known injury, initial encounter EXAM: LEFT KNEE - COMPLETE 4+ VIEW COMPARISON:  None. FINDINGS: No evidence of fracture, dislocation, or joint effusion. No evidence of arthropathy or other focal bone abnormality. Soft tissues are unremarkable. IMPRESSION: No acute abnormality noted. Electronically Signed   By: MInez CatalinaM.D.   On: 06/16/2017 14:43   Dg Knee Complete 4 Views Right  Result Date: 06/16/2017 CLINICAL DATA:  Knee pain common no known injury, initial encounter EXAM: RIGHT KNEE - COMPLETE 4+ VIEW COMPARISON:  None. FINDINGS: No evidence of fracture, dislocation, or joint effusion. No evidence of arthropathy or other focal bone abnormality. Soft tissues are unremarkable. IMPRESSION: No acute abnormality noted. Electronically Signed   By: MElta Guadeloupe  Lukens M.D.   On: 06/16/2017  14:46    Impression/Plan: 52. 72 yo woman with ER positive breast cancer with a solitary brain metastasis and bony metastatses.  She appears to have recovered well from the effects of radiotherapy to the thoracic spine at the level of T8.  Regarding her metastatic brain disease, she appears to be clinically and radiographically stable with recent brain MRI from 06/15/2017 showing no new or progressive contrast enhancing lesions and no residual abnormality at the site of the previous treated right cerebellar lesion.  We will continue to monitor her closely with serial brain MRI scans every 3 months and office follow-up thereafter to review results.  Regarding her systemic disease, she will continue in routine follow-up for disease management under the care and direction of [name] with whom she has an upcoming appointment on 07/06/2017.   2. Spine metastases.  We will alternate further spine imaging for surveillance with PET scans as ordered by [name] and MRI T-spine in between.  She will be due for thoracic imaging in late April, early May so we will coordinate this with her upcoming brain imaging. 3. Hypermetabolic right lower lobe mass, 4 cm.  This was identified on recent PET scan from April 18, 2017.  The decision at that time was to make a change in her systemic therapy and hold off on radiotherapy until further assessment of her treatment response.  Should this lesion progress despite systemic therapy or the patient become symptomatic, we would be happy to offer radiotherapy.      Nicholos Johns, PA-C

## 2017-06-20 NOTE — Addendum Note (Signed)
Encounter addended by: Freeman Caldron, PA-C on: 06/20/2017 8:17 PM  Actions taken: Sign clinical note

## 2017-06-21 ENCOUNTER — Encounter (HOSPITAL_COMMUNITY): Payer: Self-pay

## 2017-06-28 DIAGNOSIS — I129 Hypertensive chronic kidney disease with stage 1 through stage 4 chronic kidney disease, or unspecified chronic kidney disease: Secondary | ICD-10-CM | POA: Diagnosis not present

## 2017-06-28 DIAGNOSIS — F419 Anxiety disorder, unspecified: Secondary | ICD-10-CM | POA: Diagnosis not present

## 2017-06-28 DIAGNOSIS — K219 Gastro-esophageal reflux disease without esophagitis: Secondary | ICD-10-CM | POA: Diagnosis not present

## 2017-06-28 DIAGNOSIS — N183 Chronic kidney disease, stage 3 (moderate): Secondary | ICD-10-CM | POA: Diagnosis not present

## 2017-06-28 DIAGNOSIS — C50411 Malignant neoplasm of upper-outer quadrant of right female breast: Secondary | ICD-10-CM | POA: Diagnosis not present

## 2017-06-28 DIAGNOSIS — N3941 Urge incontinence: Secondary | ICD-10-CM | POA: Diagnosis not present

## 2017-06-28 DIAGNOSIS — C7951 Secondary malignant neoplasm of bone: Secondary | ICD-10-CM | POA: Diagnosis not present

## 2017-06-28 DIAGNOSIS — R42 Dizziness and giddiness: Secondary | ICD-10-CM | POA: Diagnosis not present

## 2017-06-29 ENCOUNTER — Other Ambulatory Visit: Payer: Self-pay | Admitting: Radiation Therapy

## 2017-06-29 DIAGNOSIS — C7949 Secondary malignant neoplasm of other parts of nervous system: Secondary | ICD-10-CM

## 2017-06-29 DIAGNOSIS — C7931 Secondary malignant neoplasm of brain: Secondary | ICD-10-CM

## 2017-07-06 ENCOUNTER — Inpatient Hospital Stay: Payer: Medicare Other

## 2017-07-06 ENCOUNTER — Encounter: Payer: Self-pay | Admitting: Adult Health

## 2017-07-06 ENCOUNTER — Telehealth: Payer: Self-pay

## 2017-07-06 ENCOUNTER — Inpatient Hospital Stay (HOSPITAL_BASED_OUTPATIENT_CLINIC_OR_DEPARTMENT_OTHER): Payer: Medicare Other | Admitting: Adult Health

## 2017-07-06 VITALS — BP 111/51

## 2017-07-06 VITALS — BP 149/99 | HR 83 | Temp 97.4°F | Resp 17 | Ht 64.0 in | Wt 130.6 lb

## 2017-07-06 DIAGNOSIS — Z9011 Acquired absence of right breast and nipple: Secondary | ICD-10-CM | POA: Diagnosis not present

## 2017-07-06 DIAGNOSIS — Z170 Estrogen receptor positive status [ER+]: Secondary | ICD-10-CM

## 2017-07-06 DIAGNOSIS — Z5112 Encounter for antineoplastic immunotherapy: Secondary | ICD-10-CM | POA: Diagnosis not present

## 2017-07-06 DIAGNOSIS — G9529 Other cord compression: Secondary | ICD-10-CM

## 2017-07-06 DIAGNOSIS — C7951 Secondary malignant neoplasm of bone: Secondary | ICD-10-CM

## 2017-07-06 DIAGNOSIS — Z923 Personal history of irradiation: Secondary | ICD-10-CM

## 2017-07-06 DIAGNOSIS — Z9221 Personal history of antineoplastic chemotherapy: Secondary | ICD-10-CM | POA: Diagnosis not present

## 2017-07-06 DIAGNOSIS — C7931 Secondary malignant neoplasm of brain: Secondary | ICD-10-CM | POA: Diagnosis not present

## 2017-07-06 DIAGNOSIS — Z79811 Long term (current) use of aromatase inhibitors: Secondary | ICD-10-CM | POA: Diagnosis not present

## 2017-07-06 DIAGNOSIS — M25562 Pain in left knee: Secondary | ICD-10-CM

## 2017-07-06 DIAGNOSIS — M25561 Pain in right knee: Secondary | ICD-10-CM

## 2017-07-06 DIAGNOSIS — C50911 Malignant neoplasm of unspecified site of right female breast: Secondary | ICD-10-CM

## 2017-07-06 DIAGNOSIS — C50411 Malignant neoplasm of upper-outer quadrant of right female breast: Secondary | ICD-10-CM | POA: Diagnosis not present

## 2017-07-06 LAB — COMPREHENSIVE METABOLIC PANEL
ALT: 61 U/L — ABNORMAL HIGH (ref 0–55)
AST: 72 U/L — ABNORMAL HIGH (ref 5–34)
Albumin: 3.6 g/dL (ref 3.5–5.0)
Alkaline Phosphatase: 88 U/L (ref 40–150)
Anion gap: 8 (ref 3–11)
BUN: 11 mg/dL (ref 7–26)
CO2: 29 mmol/L (ref 22–29)
Calcium: 12.8 mg/dL — ABNORMAL HIGH (ref 8.4–10.4)
Chloride: 102 mmol/L (ref 98–109)
Creatinine, Ser: 0.82 mg/dL (ref 0.60–1.10)
GFR calc Af Amer: >60 mL/min (ref >60)
GFR calc non Af Amer: >60 mL/min (ref >60)
Glucose, Bld: 71 mg/dL (ref 70–140)
Potassium: 3.8 mmol/L (ref 3.5–5.1)
Sodium: 139 mmol/L (ref 136–145)
Total Bilirubin: 0.5 mg/dL (ref 0.2–1.2)
Total Protein: 7.5 g/dL (ref 6.4–8.3)

## 2017-07-06 LAB — CBC WITH DIFFERENTIAL/PLATELET
Basophils Absolute: 0.1 10*3/uL (ref 0.0–0.1)
Basophils Relative: 1 %
Eosinophils Absolute: 0.2 10*3/uL (ref 0.0–0.5)
Eosinophils Relative: 4 %
HCT: 39.3 % (ref 34.8–46.6)
Hemoglobin: 12.7 g/dL (ref 11.6–15.9)
Lymphocytes Relative: 11 %
Lymphs Abs: 0.5 10*3/uL — ABNORMAL LOW (ref 0.9–3.3)
MCH: 26 pg (ref 25.1–34.0)
MCHC: 32.4 g/dL (ref 31.5–36.0)
MCV: 80.1 fL (ref 79.5–101.0)
Monocytes Absolute: 0.5 10*3/uL (ref 0.1–0.9)
Monocytes Relative: 11 %
Neutro Abs: 3.3 10*3/uL (ref 1.5–6.5)
Neutrophils Relative %: 73 %
Platelets: 186 10*3/uL (ref 145–400)
RBC: 4.9 MIL/uL (ref 3.70–5.45)
RDW: 15.5 % — ABNORMAL HIGH (ref 11.2–14.5)
WBC: 4.6 10*3/uL (ref 3.9–10.3)

## 2017-07-06 MED ORDER — ZOLEDRONIC ACID 4 MG/100ML IV SOLN
4.0000 mg | Freq: Once | INTRAVENOUS | Status: AC
  Administered 2017-07-06: 4 mg via INTRAVENOUS
  Filled 2017-07-06: qty 100

## 2017-07-06 MED ORDER — SODIUM CHLORIDE 0.9 % IV SOLN
Freq: Once | INTRAVENOUS | Status: AC
  Administered 2017-07-06: 10:00:00 via INTRAVENOUS

## 2017-07-06 MED ORDER — DIPHENHYDRAMINE HCL 25 MG PO CAPS: 50.0000 mg | ORAL_CAPSULE | Freq: Once | ORAL | Status: DC

## 2017-07-06 MED ORDER — DIPHENHYDRAMINE HCL 25 MG PO CAPS
ORAL_CAPSULE | ORAL | Status: AC
  Filled 2017-07-06: qty 1

## 2017-07-06 MED ORDER — SODIUM CHLORIDE 0.9% FLUSH
10.0000 mL | INTRAVENOUS | Status: DC | PRN
  Administered 2017-07-06: 10 mL
  Filled 2017-07-06: qty 10

## 2017-07-06 MED ORDER — ADO-TRASTUZUMAB EMTANSINE CHEMO INJECTION 160 MG
2.5000 mg/kg | Freq: Once | INTRAVENOUS | Status: AC
  Administered 2017-07-06: 160 mg via INTRAVENOUS
  Filled 2017-07-06: qty 8

## 2017-07-06 MED ORDER — ACETAMINOPHEN 325 MG PO TABS
650.0000 mg | ORAL_TABLET | Freq: Once | ORAL | Status: AC
  Administered 2017-07-06: 650 mg via ORAL

## 2017-07-06 MED ORDER — HEPARIN SOD (PORK) LOCK FLUSH 100 UNIT/ML IV SOLN
500.0000 [IU] | Freq: Once | INTRAVENOUS | Status: AC | PRN
  Administered 2017-07-06: 500 [IU]
  Filled 2017-07-06: qty 5

## 2017-07-06 MED ORDER — ACETAMINOPHEN 325 MG PO TABS
ORAL_TABLET | ORAL | Status: AC
  Filled 2017-07-06: qty 2

## 2017-07-06 MED ORDER — SODIUM CHLORIDE 0.9 % IJ SOLN
10.0000 mL | INTRAMUSCULAR | Status: DC | PRN
  Administered 2017-07-06: 10 mL via INTRAVENOUS
  Filled 2017-07-06: qty 10

## 2017-07-06 NOTE — Telephone Encounter (Signed)
Call made to central scheduling to schedule CT scan for pt for 3/20. Scheduler stated that it is in their que and they will call pt to schedule appt on specified date.

## 2017-07-06 NOTE — Patient Instructions (Signed)
[facility] Discharge Instructions for Patients Receiving Chemotherapy  Today you received the following chemotherapy agents:  Kadcyla (ado-trastuzumab emtansine)  To help prevent nausea and vomiting after your treatment, we encourage you to take your nausea medication as prescribed.   If you develop nausea and vomiting that is not controlled by your nausea medication, call the clinic.   BELOW ARE SYMPTOMS THAT SHOULD BE REPORTED IMMEDIATELY:  *FEVER GREATER THAN 100.5 F  *CHILLS WITH OR WITHOUT FEVER  NAUSEA AND VOMITING THAT IS NOT CONTROLLED WITH YOUR NAUSEA MEDICATION  *UNUSUAL SHORTNESS OF BREATH  *UNUSUAL BRUISING OR BLEEDING  TENDERNESS IN MOUTH AND THROAT WITH OR WITHOUT PRESENCE OF ULCERS  *URINARY PROBLEMS  *BOWEL PROBLEMS  UNUSUAL RASH Items with * indicate a potential emergency and should be followed up as soon as possible.  Feel free to call the clinic should you have any questions or concerns. The clinic phone number is [phone].  Please show the [facility] at check-in to the Emergency Department and triage nurse.

## 2017-07-06 NOTE — Telephone Encounter (Signed)
Per [name] pt does not have to take pre-medication benadryl today with Kadcyla treatment.

## 2017-07-06 NOTE — Assessment & Plan Note (Addendum)
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 MRI 02/14/2015: T5 large destructive lesion with pathologic compression fracture and extensive epidural tumor, involvement of T4, excision metastatic carcinoma ER 60%, PR 0%, HER-2 positive ratio 2.71 average copy #7.45 03/27/2015: T4-6 decompression surgery for spinal cord compression PET-CT 12/10/17Rt para-spinal mass mildy hypermetabolic SUV 5.9, lytic cortical lesion 4.8 cm lesion left prox femur suv 8.8 favor osseus met disease Echo 04/12/2017 shows well preserved ejection fraction of 55-60%   Treatment summary: 1. Resumed anastrozole 05/21/2015, but it has caused significant hair loss so we switched her to exemestane 25 mg once daily 07/23/2015 2. Herceptin every 3 week started 04/10/2015 3. Bone metastases: Zometa every 3 months-next due in March, 2019 4. Radiation therapy to the spine (Dr. [name]) 04/13/2015 to 05/19/2015 5. Radiation therapy to left femur 04/28/2016 to 05/12/2016  6. SRS to the brain lesion 11/24/2016 -------------------------------------------------------------------------------------------------------------------------- Goal of treatment: Palliation Treatment plan:Kadcyla 05/05/2017 every 3 weeks, today's cycle 4 Chemo toxicities: She had burning of her feet after last cycle, Kadcyla further dose reduced and has improved Knee pain: xrays of both knees after kadcyla-normal    Bone metastases: Zometa every 3 months with calcium and vitamin D (due again in March) Will add CT chest for March 20 and review results on March 21 at her next appointment.  She is due for MRI brain and spine in May, 2019.  Will plan on PET after 6 cycles. Return to clinic in 3 weeks for cycle 5

## 2017-07-06 NOTE — Progress Notes (Addendum)
[name] Follow up:    Heather Stains, MD [name]   DIAGNOSIS: Cancer Staging Primary cancer of upper outer quadrant of right female breast [facility]) Staging form: Breast, AJCC 7th Edition - Clinical: Stage IIB (T2, N1, cM0) - Unsigned Staging comments: Staged at breast conference 11.6.13  - Pathologic: No stage assigned - Unsigned   SUMMARY OF ONCOLOGIC HISTORY:   Primary cancer of upper outer quadrant of right female breast (Heather [name])   03/08/2012 Initial Diagnosis    Right breast invasive ductal carcinoma ER positive PR negative HER-2 positive Ki-67 44%; another breast mass biopsied in the anterior part of the breast which was also positive for malignancy that was HER-2 negative      05/10/2012 Surgery    Right mastectomy and axillary lymph node dissection: Multifocal disease 5 cm, 1.7 cm, 1.6 cm, ER positive PR negative HER-2 positive Ki-67 44%, 3/7 lymph nodes positive      06/14/2012 - 06/12/2013 Chemotherapy    Adjuvant chemotherapy with [place] 6 followed by Herceptin maintenance      10/25/2012 - 12/11/2012 Radiation Therapy    Adjuvant radiation therapy      02/07/2013 -  Anti-estrogen oral therapy    Letrozole 2.5 mg daily      02/14/2015 Imaging    MRI spine: Large destructive T5 lesion with severe pathologic compression fracture and extensive epidural tumor, severe spinal stenosis with moderate cord compression, tumor involvement of T4      03/18/2015 PET scan    Residual enhancing soft tissue adjacent to the spinal cord. No evidence of metastatic disease. Nonspecific uptake the left nipple      03/27/2015 - 03/30/2015 Hospital Admission    T4-6 decompression for spinal cord compression (lower extremity paralysis)      04/10/2015 -  Chemotherapy    Palliative treatment with Herceptin every 3 weeks with letrozole 2.5 mg daily      04/13/2015 - 05/19/2015 Radiation Therapy    Palliative radiation treatment to  the spine      09/01/2015 Imaging    CT chest abdomen pelvis: Pathologic fracture with posterior fusion at T5 no other evidence of metastatic disease in the chest abdomen pelvis      12/23/2015 Imaging    CT CAP: new nonspecific 0.6 cm lymph node was stated mediastinum needs follow-up CT, innumerable tiny groundglass pulmonary nodules throughout both lungs unchanged, patchy consolidation from radiation, T5 fracture, no mets      04/15/2016 PET scan    Rt para-spinal mass mildy hypermetabolic SUV 5.9, lytic cortical lesion 4.8 cm lesion left prox femur suv 8.8 favor osseus met disease      04/28/2016 - 05/12/2016 Radiation Therapy    Palliative XRT Left femur      05/24/2016 Procedure    Soft tissue mass biopsy back: Metastatic breast cancer ER 80%, PR 0%, Ki-67 50%, HER-2 positive ratio 2.25      06/09/2016 -  Anti-estrogen oral therapy    Faslodex with Herceptin and Perjeta every 4 weeks      06/13/2016 - 06/15/2016 Hospital Admission    uncomplicated revision of previous thoracic instrumentation.      10/10/2016 PET scan    Interval development of new hypermetabolic 2.5 x 1.1 cm lesion posterior right eighth rib consistent with metastatic disease, stable right paraspinal lesion at T8, clustered soft tissue nodules in the posterior midline back near cervicothoracic junction smaller than before  11/03/2016 - 11/07/2016 Radiation Therapy    Palliative radiation to right eighth rib      11/04/2016 Imaging    Solitary contrast-enhancing lesion in the right cerebellum 6 x 2 mm concerning for metastatic lesion      11/24/2016 - 11/24/2016 Radiation Therapy    SRS to the brain lesion      04/18/2017 PET scan    New ill-defined rounded airspace opacity in posterior right lower lobe. Differential diagnosis includes malignancy and infectious/inflammatory process.Increased hypermetabolic lytic lesion in T8 vertebral body, consistent with bone metastasis.  Increased hypermetabolic posterior  upper chest wall subcutaneous nodules, highly suspicious metastatic disease. Further improvement in right posterior eighth rib metastasis.      05/04/2017 -  Chemotherapy    Kadcyla every 3 weeks palliative chemotherapy       CURRENT THERAPY: Kadcyla  INTERVAL HISTORY: Heather [name] 72 y.o. female returns for evaluation prior to [name].  Last week we reduced the dose of Kadcyla due to burning in her hands and feet.  Her left foot still has some burning.  She is wearing compression hose to help with it.  This burning is constant, and she is tolerating it and managing it well.  She is feeling better with the dose reduction and didn't have these episodes with the reduction.  Her nodules remain in her back, however her husband notes they aren't as swollen.     Patient Active Problem List   Diagnosis Date Noted  . Brain metastasis ([place]) 11/18/2016  . Genetic testing 07/11/2016  . ALLERGY TO CHLORHEXADINE, USE BETADINE ONLY 06/23/2016  . Kyphosis of thoracic region 06/13/2016  . Plantar fasciitis, bilateral 04/20/2016  . Neuropathic pain 01/27/2016  . Anemia of chronic disease 05/21/2015  . Metastatic cancer to spine ([place]) 03/27/2015  . Thrombocytopenia ([place]) 03/12/2015  . Prerenal azotemia 03/12/2015  . Pain   . Recurrent UTI--due to Klebsiella   . Paraplegia at T4 level ([place]) 02/20/2015  . Neurogenic bowel 02/20/2015  . Neurogenic bladder 02/20/2015  . Postoperative anemia due to acute blood loss 02/17/2015  . Spinal cord compression due to malignant neoplasm metastatic to spine ([place]) 02/15/2015  . Epidural mass 02/15/2015  . Thoracic spine tumor   . Pathologic fracture of vertebra 02/14/2015  . Pathologic fracture of thoracic vertebrae 02/14/2015  . Breast cancer metastasized to bone, right ([place]) 02/14/2015  . Hyperlipidemia 02/14/2015  . H. pylori infection   . HTN (hypertension) 04/17/2012  . Primary cancer of upper outer quadrant of right female breast ([place]) 03/08/2012     is allergic to other; acyclovir and related; zanaflex [tizanidine hcl]; codeine; keflex [cephalexin]; naprosyn [naproxen]; oxycodone; tramadol; and tussin [guaifenesin].  MEDICAL HISTORY: Past Medical History:  Diagnosis Date  . Anemia    hx of  . Anxiety    r/t updated surgery  . Breast cancer ([place])    right  . Cataract    immature-not sure of which eye  . Complication of anesthesia    pt states she is sensitive to meds  . Dizziness    has been going on for 82month;medical MD aware  . Genetic testing 07/11/2016   Test Results: Normal; No pathogenic mutations detected  Genes Analyzed: 43 genes on [employer]'s Common Cancers panel (APC, ATM, AXIN2, BARD1, BMPR1A, BRCA1, BRCA2, BRIP1, CDH1, CDKN2A, CHEK2, DICER1, EPCAM, GREM1, HOXB13, KIT, MEN1, MLH1, MSH2, MSH6, MUTYH, NBN, NF1, PALB2, PDGFRA, PMS2, POLD1, POLE, PTEN, RAD50, RAD51C, RAD51D, SDHA, SDHB, SDHC, SDHD, SMAD4, SMARCA4, STK11, TP53, TSC1, TSC2,  VHL).  . GERD (gastroesophageal reflux disease)    Tums prn  . H. pylori infection   . H/O hiatal hernia   . Hemorrhoids   . History of UTI   . Hx of radiation therapy 10/25/12- 12/11/12   right chest wall/regional lymph nodes 5040 cGy, 28 sessions, right chest wall boost 1000 cGy 1 session  . Hx: UTI (urinary tract infection)   . Hyperlipidemia    but not on meds;diet and exercise controlled  . Hypertension    recently started Aldactone   . Insomnia    takes Melatoniin daily  . Metastasis to spinal column ([facility])    T5, Left  Femur cancer- radiation   . Neuropathy    "from back surgery"  . PONV (postoperative nausea and vomiting)    pt experienced hair loss, confusion and combative- 02/15/15  . Right knee pain   . Right shoulder pain   . Skin cancer    squamous, Nodule to back - "same cancer as breast."  . Urinary frequency    d/t taking Aldactone    SURGICAL HISTORY: Past Surgical History:  Procedure Laterality Date  . COLONOSCOPY    . cyst removed from left breast   1970  . DECOMPRESSIVE LUMBAR LAMINECTOMY LEVEL 4 N/A 02/15/2015   Procedure: DECOMPRESSION T5 AND T3-T7 STABALIZATION;  Surgeon: Consuella Lose, MD;  Location: [place];  Service: Neurosurgery;  Laterality: N/A;  . ESOPHAGOGASTRODUODENOSCOPY    . LAMINECTOMY N/A 03/27/2015   Procedure: Thoracic Four-Thoracic Six Laminectomy for tumor;  Surgeon: Consuella Lose, MD;  Location: [place] [facility];  Service: Neurosurgery;  Laterality: N/A;  T4-T6 Laminectomy  . left wrist surgery   2004   with plate  . MASTECTOMY MODIFIED RADICAL  05/10/2012   Procedure: MASTECTOMY MODIFIED RADICAL;  Surgeon: Merrie Roof, MD;  Location: [facility];  Service: General;  Laterality: Right;  RIGHT MODIFIED RADICAL MASTECTOMY  . MASTECTOMY, RADICAL Right   . MINOR BREAST BIOPSY Left 05/16/2016   Procedure: EXCISION OF BACK NODULE;  Surgeon: Autumn Messing III, MD;  Location: [facility];  Service: General;  Laterality: Left;  . Nodule  back  05/17/2015  . PORTACATH PLACEMENT  05/10/2012   Procedure: INSERTION PORT-A-CATH;  Surgeon: Merrie Roof, MD;  Location: [facility];  Service: General;  Laterality: Left;    SOCIAL HISTORY: Social History   Socioeconomic History  . Marital status: Married    Spouse name: Not on file  . Number of children: 2  . Years of education: Not on file  . Highest education level: Not on file  Social Needs  . Financial resource strain: Not on file  . Food insecurity - worry: Not on file  . Food insecurity - inability: Not on file  . Transportation needs - medical: Not on file  . Transportation needs - non-medical: Not on file  Occupational History  . Not on file  Tobacco Use  . Smoking status: Never Smoker  . Smokeless tobacco: Never Used  Substance and Sexual Activity  . Alcohol use: No  . Drug use: No  . Sexual activity: Yes    Birth control/protection: Post-menopausal  Other Topics Concern  . Not on file  Social History Narrative  . Not on file    FAMILY  HISTORY: Family History  Problem Relation Age of Onset  . Leukemia Mother 66       deceased 47  . Stroke Father   . Diabetes Mellitus II Father   .  Prostate cancer Brother 18       currently 83  . Stomach cancer Maternal Grandmother 58       deceased 27  . Prostate cancer Brother 20       currently 75    Review of Systems  Constitutional: Positive for fatigue. Negative for appetite change, chills, fever and unexpected weight change.  HENT:   Negative for hearing loss, lump/mass and trouble swallowing.   Eyes: Negative for eye problems and icterus.  Respiratory: Negative for chest tightness, cough and shortness of breath.   Cardiovascular: Negative for chest pain, leg swelling and palpitations.  Gastrointestinal: Positive for constipation (managed with miralax). Negative for abdominal distention, abdominal pain, diarrhea, nausea and vomiting.  Endocrine: Negative for hot flashes.  Genitourinary: Negative for difficulty urinating.   Musculoskeletal: Negative for arthralgias.  Skin: Negative for itching and rash.  Neurological: Positive for dizziness (room spinning, seeing vestibular rehab next week). Negative for headaches.  Hematological: Negative for adenopathy. Does not bruise/bleed easily.  Psychiatric/Behavioral: Negative for depression. The patient is not nervous/anxious.       PHYSICAL EXAMINATION  ECOG PERFORMANCE STATUS: 2 - Symptomatic, <50% confined to bed  Vitals:   07/06/17 0853  BP: (!) 149/99  Pulse: 83  Resp: 17  Temp: (!) 97.4 F (36.3 C)  SpO2: 97%    Physical Exam  Constitutional: She is oriented to person, place, and time and well-developed, well-nourished, and in no distress.  HENT:  Head: Normocephalic and atraumatic.  Mouth/Throat: Oropharynx is clear and moist. No oropharyngeal exudate.  Eyes: Pupils are equal, round, and reactive to light. No scleral icterus.  Neck: Neck supple.  Cardiovascular: Normal rate, regular rhythm and normal heart  sounds.  Pulmonary/Chest: Effort normal and breath sounds normal. No respiratory distress. She has no wheezes. She has no rales.  Two subcutaneous masses on back that are unchanged  Abdominal: Soft. Bowel sounds are normal. She exhibits no distension. There is no tenderness.  Musculoskeletal: She exhibits no edema.  Lymphadenopathy:    She has no cervical adenopathy.  Neurological: She is alert and oriented to person, place, and time.  Skin: Skin is warm and dry. No rash noted.  Psychiatric: Mood and affect normal.    LABORATORY DATA:  CBC    Component Value Date/Time   WBC 4.6 07/06/2017 0814   RBC 4.90 07/06/2017 0814   HGB 12.7 07/06/2017 0814   HGB 12.0 05/05/2017 1046   HCT 39.3 07/06/2017 0814   HCT 36.9 05/05/2017 1046   PLT 186 07/06/2017 0814   PLT 210 05/05/2017 1046   MCV 80.1 07/06/2017 0814   MCV 83.1 05/05/2017 1046   MCH 26.0 07/06/2017 0814   MCHC 32.4 07/06/2017 0814   RDW 15.5 (H) 07/06/2017 0814   RDW 14.8 (H) 05/05/2017 1046   LYMPHSABS 0.5 (L) 07/06/2017 0814   LYMPHSABS 0.6 (L) 05/05/2017 1046   MONOABS 0.5 07/06/2017 0814   MONOABS 0.3 05/05/2017 1046   EOSABS 0.2 07/06/2017 0814   EOSABS 0.1 05/05/2017 1046   BASOSABS 0.1 07/06/2017 0814   BASOSABS 0.1 05/05/2017 1046    CMP     Component Value Date/Time   NA 139 07/06/2017 0814   NA 137 05/05/2017 1046   K 3.8 07/06/2017 0814   K 3.9 05/05/2017 1046   CL 102 07/06/2017 0814   CL 101 10/25/2012 0946   CO2 29 07/06/2017 0814   CO2 26 05/05/2017 1046   GLUCOSE 71 07/06/2017 0814   GLUCOSE 88 05/05/2017  1046   GLUCOSE 90 10/25/2012 0946   BUN 11 07/06/2017 0814   BUN 14.1 05/05/2017 1046   CREATININE 0.82 07/06/2017 0814   CREATININE 0.8 05/05/2017 1046   CALCIUM 12.8 (H) 07/06/2017 0814   CALCIUM 10.3 05/05/2017 1046   PROT 7.5 07/06/2017 0814   PROT 7.2 05/05/2017 1046   ALBUMIN 3.6 07/06/2017 0814   ALBUMIN 3.7 05/05/2017 1046   AST 72 (H) 07/06/2017 0814   AST 26 05/05/2017  1046   ALT 61 (H) 07/06/2017 0814   ALT 17 05/05/2017 1046   ALKPHOS 88 07/06/2017 0814   ALKPHOS 74 05/05/2017 1046   BILITOT 0.5 07/06/2017 0814   BILITOT 0.43 05/05/2017 1046   GFRNONAA >60 07/06/2017 0814   GFRAA >60 07/06/2017 0814         ASSESSMENT and THERAPY PLAN:   Primary cancer of upper outer quadrant of right female breast ([place]) Right breast invasive ductal carcinoma ER positive PR negative HER-2 positive Ki-67 44% multifocal disease 3/7 lymph nodes positive T2 N1 M0 stage IIB status post adjuvant chemotherapy with TCH followed by Herceptin maintenance, adjuvant radiation therapy and Letrozole. MRI 02/14/2015: T5 large destructive lesion with pathologic compression fracture and extensive epidural tumor, involvement of T4, excision metastatic carcinoma ER 60%, PR 0%, HER-2 positive ratio 2.71 average copy #7.45 03/27/2015: T4-6 decompression surgery for spinal cord compression PET-CT 12/10/17Rt para-spinal mass mildy hypermetabolic SUV 5.9, lytic cortical lesion 4.8 cm lesion left prox femur suv 8.8 favor osseus met disease Echo 04/12/2017 shows well preserved ejection fraction of 55-60%   Treatment summary: 1. Resumed anastrozole 05/21/2015, but it has caused significant hair loss so we switched her to exemestane 25 mg once daily 07/23/2015 2. Herceptin every 3 week started 04/10/2015 3. Bone metastases: Zometa every 3 months-next due in March, 2019 4. Radiation therapy to the spine (Dr. [name]) 04/13/2015 to 05/19/2015 5. Radiation therapy to left femur 04/28/2016 to 05/12/2016  6. SRS to the brain lesion 11/24/2016 -------------------------------------------------------------------------------------------------------------------------- Goal of treatment: Palliation Treatment plan:Kadcyla 05/05/2017 every 3 weeks, today's cycle 4 Chemo toxicities: She had burning of her feet after last cycle, Kadcyla further dose reduced and has improved Knee pain: xrays of  both knees after kadcyla-normal    Bone metastases: Zometa every 3 months with calcium and vitamin D (due again in March) Will add CT chest for March 20 and review results on March 21 at her next appointment.  She is due for MRI brain and spine in May, 2019.  Will plan on PET after 6 cycles. Return to clinic in 3 weeks for cycle 5     All questions were answered. The patient knows to call the clinic with any problems, questions or concerns. We can certainly see the patient much sooner if necessary.  A total of (30) minutes of face-to-face time was spent with this patient with greater than 50% of that time in counseling and care-coordination.  This note was electronically signed. Scot Dock, NP 07/06/2017   Attending Note  I personally saw and examined Heather [name]. The plan of care was discussed with her. I agree with the assessment and plan as documented above. Metastatic breast cancer: Currently on Kadcyla today's cycle 4. With the dose reduction she appears to be tolerating it well. Plan to perform CT scans prior to the next treatment and follow-up after that. Signed Harriette Ohara, MD

## 2017-07-07 ENCOUNTER — Telehealth: Payer: Self-pay | Admitting: Adult Health

## 2017-07-07 NOTE — Telephone Encounter (Signed)
Per 2/28 no los

## 2017-07-12 ENCOUNTER — Encounter: Payer: Self-pay | Admitting: Physical Therapy

## 2017-07-12 ENCOUNTER — Other Ambulatory Visit: Payer: Self-pay

## 2017-07-12 ENCOUNTER — Ambulatory Visit: Payer: Medicare Other | Attending: Neurosurgery | Admitting: Physical Therapy

## 2017-07-12 DIAGNOSIS — R42 Dizziness and giddiness: Secondary | ICD-10-CM

## 2017-07-12 DIAGNOSIS — R2689 Other abnormalities of gait and mobility: Secondary | ICD-10-CM | POA: Insufficient documentation

## 2017-07-12 DIAGNOSIS — H8111 Benign paroxysmal vertigo, right ear: Secondary | ICD-10-CM | POA: Insufficient documentation

## 2017-07-12 DIAGNOSIS — R2681 Unsteadiness on feet: Secondary | ICD-10-CM | POA: Insufficient documentation

## 2017-07-12 NOTE — Therapy (Signed)
[facility] [address] [place], [place], [address] Phone: [phone]   Fax:  [phone]  Physical Therapy Evaluation  Patient Details  Name: Heather [name] MRN: [id number] Date of Birth: 1946/04/04 Referring Provider: [name], MD   Encounter Date: 07/12/2017  PT End of Session - 07/12/17 2144    Visit Number  1    Number of Visits  17    Date for PT Re-Evaluation  09/10/17    Authorization Type  Medicare, BCBS    PT Start Time  [phone]    PT Stop Time  1015    PT Time Calculation (min)  49 min    Activity Tolerance  Patient tolerated treatment well    Behavior During Therapy  [facility] for tasks assessed/performed       Past Medical History:  Diagnosis Date  . Anemia    hx of  . Anxiety    r/t updated surgery  . Breast cancer ([place])    right  . Cataract    immature-not sure of which eye  . Complication of anesthesia    pt states she is sensitive to meds  . Dizziness    has been going on for 69month;medical MD aware  . Genetic testing 07/11/2016   Test Results: Normal; No pathogenic mutations detected  Genes Analyzed: 43 genes on [employer]'s Common Cancers panel (APC, ATM, AXIN2, BARD1, BMPR1A, BRCA1, BRCA2, BRIP1, CDH1, CDKN2A, CHEK2, DICER1, EPCAM, GREM1, HOXB13, KIT, MEN1, MLH1, MSH2, MSH6, MUTYH, NBN, NF1, PALB2, PDGFRA, PMS2, POLD1, POLE, PTEN, RAD50, RAD51C, RAD51D, SDHA, SDHB, SDHC, SDHD, SMAD4, SMARCA4, STK11, TP53, TSC1, TSC2, VHL).  .Marland KitchenGERD (gastroesophageal reflux disease)    Tums prn  . H. pylori infection   . H/O hiatal hernia   . Hemorrhoids   . History of UTI   . Hx of radiation therapy 10/25/12- 12/11/12   right chest wall/regional lymph nodes 5040 cGy, 28 sessions, right chest wall boost 1000 cGy 1 session  . Hx: UTI (urinary tract infection)   . Hyperlipidemia    but not on meds;diet and exercise controlled  . Hypertension    recently started Aldactone   . Insomnia    takes Melatoniin daily   . Metastasis to spinal column (HCC)    T5, Left  Femur cancer- radiation   . Neuropathy    "from back surgery"  . PONV (postoperative nausea and vomiting)    pt experienced hair loss, confusion and combative- 02/15/15  . Right knee pain   . Right shoulder pain   . Skin cancer    squamous, Nodule to back - "same cancer as breast."  . Urinary frequency    d/t taking Aldactone    Past Surgical History:  Procedure Laterality Date  . COLONOSCOPY    . cyst removed from left breast  1970  . DECOMPRESSIVE LUMBAR LAMINECTOMY LEVEL 4 N/A 02/15/2015   Procedure: DECOMPRESSION T5 AND T3-T7 STABALIZATION;  Surgeon: NConsuella Lose MD;  [facility];  Service: Neurosurgery;  Laterality: N/A;  . ESOPHAGOGASTRODUODENOSCOPY    . LAMINECTOMY N/A 03/27/2015   Procedure: Thoracic Four-Thoracic Six Laminectomy for tumor;  Surgeon: NConsuella Lose MD;  [facility];  Service: Neurosurgery;  Laterality: N/A;  T4-T6 Laminectomy  . left wrist surgery   2004   with plate  . MASTECTOMY MODIFIED RADICAL  05/10/2012   Procedure: MASTECTOMY MODIFIED RADICAL;  Surgeon: PMerrie Roof MD;  [place] [facility]  Service: General;  Laterality:  Right;  RIGHT MODIFIED RADICAL MASTECTOMY  . MASTECTOMY, RADICAL Right   . MINOR BREAST BIOPSY Left 05/16/2016   Procedure: EXCISION OF BACK NODULE;  Surgeon: Autumn Messing III, MD;  Location: [facility];  Service: General;  Laterality: Left;  . Nodule  back  05/17/2015  . PORTACATH PLACEMENT  05/10/2012   Procedure: INSERTION PORT-A-CATH;  Surgeon: Merrie Roof, MD;  Location: [facility];  Service: General;  Laterality: Left;    There were no vitals filed for this visit.   Subjective Assessment - 07/12/17 0930    Subjective  Pt was participating in [place] for balance and was supposed to start back after Christmas but due to radiation and fatigue pt has not started back with balance therapy.  Pt reported to PCP that she is having severe dizziness;  referred to neuro OPPT by physician to treat dizziness and prevent falls.  Pt noticed dizziness while lying flat for radiation.    Pertinent History  metastatic breast CA with mets to cerebellum, ribs, spine; T4 paraplegia due to metastatic lesions, neuropathy, HTN, osteopenia, CKD, OA    Limitations  Walking    Patient Stated Goals  Get rid of the dizziness "if that is realistic"    Currently in Pain?  No/denies         [facility] PT Assessment - 07/12/17 1696      Assessment   Medical Diagnosis  Vertigo    Referring Provider  [name], MD    Onset Date/Surgical Date  07/03/17 date of referral    Prior Therapy  CIR, OPPT for balance and strength training      Precautions   Precautions  Fall;Other (comment)    Precaution Comments  metastatic breast CA with mets to cerebellum, ribs, spine; T4 paraplegia due to metastatic lesions, neuropathy, HTN, osteopenia, CKD, OA      Balance Screen   Has the patient fallen in the past 6 months  Yes    How many times?  1 October; fell because she tripped      Musician residence      Prior Function   Level of Independence  Independent with household mobility with device;Independent with community mobility with device;Independent with gait;Independent with transfers;Requires assistive device for independence      Observation/Other Assessments   Focus on Therapeutic Outcomes (FOTO)   51% (49% impaired; predicted 38% impairment at D/C)      Sensation   Light Touch  Impaired Detail    Light Touch Impaired Details  Impaired LUE;Impaired RLE    Proprioception  Impaired by gross assessment         Vestibular Assessment - 07/12/17 0942      Vestibular Assessment   General Observation  Reports ongoing issues with dizziness.  Denies changes in vision or hearing; does have chronic tinnitus.      Symptom Behavior   Type of Dizziness  Spinning also woozy, lightheaded    Frequency of Dizziness  daily     Duration of Dizziness  seconds to minutes    Aggravating Factors  Lying supine;Supine to sit;Comment head tilted down reading or cooking    Relieving Factors  Head stationary      Occulomotor Exam   Occulomotor Alignment  Normal    Spontaneous  Absent    Gaze-induced  Direction changing nystagmus R beating with R gaze, L beating with L gaze    Head shaking Horizontal  Absent  Smooth Pursuits  Intact    Saccades  Poor trajectory;Comment over shooting to R    Comment  Convergence:      Vestibulo-Occular Reflex   VOR to Slow Head Movement  Normal    VOR Cancellation  Corrective saccades    Comment  HIT: - to R, + to L      Positional Testing   Dix-Hallpike  Dix-Hallpike Right;Dix-Hallpike Left    Horizontal Canal Testing  Horizontal Canal Right;Horizontal Canal Left      Dix-Hallpike Right   Dix-Hallpike Right Duration  >1 minute    Dix-Hallpike Right Symptoms  Upbeat, right rotatory nystagmus      Dix-Hallpike Left   Dix-Hallpike Left Duration  0    Dix-Hallpike Left Symptoms  No nystagmus      Horizontal Canal Right   Horizontal Canal Right Duration  0    Horizontal Canal Right Symptoms  Normal      Horizontal Canal Left   Horizontal Canal Left Duration  0    Horizontal Canal Left Symptoms  Normal         Objective measurements completed on examination: See above findings.              PT Education - 07/12/17 2144    Education provided  Yes    Education Details  clinical findings, PT POC, goals    Person(s) Educated  Patient    Methods  Explanation    Comprehension  Verbalized understanding       PT Short Term Goals - 07/12/17 2155      PT SHORT TERM GOAL #1   Title  Pt will tolerate repositioning maneuvers for R posterior canal cupulolithiasis    Time  4    Period  Weeks    Status  New    Target Date  08/11/17      PT SHORT TERM GOAL #2   Title  Pt will participate in further gait and balance testing: gait velocity and FGA    Time  4     Period  Weeks    Status  New    Target Date  08/11/17      PT SHORT TERM GOAL #3   Title  Pt will initiate gaze adaptation and balance HEP with supervision    Time  4    Period  Weeks    Status  New    Target Date  08/11/17        PT Long Term Goals - 07/12/17 2158      PT LONG TERM GOAL #1   Title  Independent with vestibular and balance HEP    Time  8    Period  Weeks    Status  New    Target Date  09/10/17      PT LONG TERM GOAL #2   Title  Pt will demonstrate negative positional vestibular testing    Baseline  R posterior canal cupulolithiasis    Time  8    Period  Weeks    Status  New    Target Date  09/10/17      PT LONG TERM GOAL #3   Title  Pt will improve use of VOR as indicated by DVA 2-3 line difference    Baseline  TBA    Time  8    Period  Weeks    Status  New    Target Date  09/10/17      PT LONG TERM GOAL #4  Title  Pt will improve gait velocity to >/= 3.6 ft/sec with cane for increased safety during community gait    Baseline  TBA    Time  8    Period  Weeks    Status  New    Target Date  09/10/17      PT LONG TERM GOAL #5   Title  Pt will improve FGA by 8 points    Baseline  TBA    Time  8    Period  Weeks    Status  New    Target Date  09/10/17      Additional Long Term Goals   Additional Long Term Goals  Yes      PT LONG TERM GOAL #6   Title  Pt will improve overall function as indicated by FOTO to >/= 60%    Baseline  51% function    Time  8    Period  Weeks    Status  New    Target Date  09/10/17             Plan - 07/12/17 2145    Clinical Impression Statement  Pt is a 72 year old female referred to Neuro OPPT for evaluation of vertigo.  Pt's PMH is significant for the following: metastatic breast CA with mets to cerebellum, ribs, spine, clavicle; T4 paraplegia due to metastatic lesions, chemotherapy induced neuropathy, HTN, osteopenia, CKD and OA. The following deficits were noted during pt's exam: upbeating R rotary  nystagmus of long duration (>1 minute) during positional testing indicating R posterior canal cupulolithiasis, impaired VOR with L vestibular hypofunction, impaired sensation and balance placing pt at increased falls risk and increased risk for injury due to osteopenia and metastatic bone lesions. Pt would benefit from skilled PT to address these impairments and functional limitations to maximize functional mobility independence and reduce falls risk.    History and Personal Factors relevant to plan of care:  extensive medical history, active metastatic cancer, sensory changes due to chemo induced neuropathy, h/o T4 paraplegia    Clinical Presentation  Evolving    Clinical Presentation due to:  extensive medical history, active metastatic cancer, sensory changes due to chemo induced neuropathy, h/o T4 paraplegia    Clinical Decision Making  Moderate    Rehab Potential  Fair    Clinical Impairments Affecting Rehab Potential  currently undergoing chemotherapy for metastatic cancer, sensory changes affecting balance and vestibular system    PT Frequency  2x / week    PT Duration  8 weeks    PT Treatment/Interventions  ADLs/Self Care Home Management;Canalith Repostioning;DME Instruction;Gait training;Functional mobility training;Therapeutic activities;Therapeutic exercise;Balance training;Neuromuscular re-education;Patient/family education;Vestibular    PT Next Visit Plan  Treat cupulolithiasis R; assess DVA, gait velocity and FGA for balance.  Initiate HEP       Patient will benefit from skilled therapeutic intervention in order to improve the following deficits and impairments:  Decreased balance, Dizziness, Impaired sensation, Difficulty walking  Visit Diagnosis: BPPV (benign paroxysmal positional vertigo), right - Plan: PT plan of care cert/re-cert  Unsteadiness on feet - Plan: PT plan of care cert/re-cert  Dizziness and giddiness - Plan: PT plan of care cert/re-cert  Other abnormalities of  gait and mobility - Plan: PT plan of care cert/re-cert     Problem List Patient Active Problem List   Diagnosis Date Noted  . Brain metastasis ([place]) 11/18/2016  . Genetic testing 07/11/2016  . ALLERGY TO CHLORHEXADINE, USE BETADINE ONLY 06/23/2016  . Kyphosis  of thoracic region 06/13/2016  . Plantar fasciitis, bilateral 04/20/2016  . Neuropathic pain 01/27/2016  . Anemia of chronic disease 05/21/2015  . Metastatic cancer to spine ([place]) 03/27/2015  . Thrombocytopenia ([place]) 03/12/2015  . Prerenal azotemia 03/12/2015  . Pain   . Recurrent UTI--due to Klebsiella   . Paraplegia at T4 level ([place]) 02/20/2015  . Neurogenic bowel 02/20/2015  . Neurogenic bladder 02/20/2015  . Postoperative anemia due to acute blood loss 02/17/2015  . Spinal cord compression due to malignant neoplasm metastatic to spine ([place]) 02/15/2015  . Epidural mass 02/15/2015  . Thoracic spine tumor   . Pathologic fracture of vertebra 02/14/2015  . Pathologic fracture of thoracic vertebrae 02/14/2015  . Breast cancer metastasized to bone, right ([place]) 02/14/2015  . Hyperlipidemia 02/14/2015  . H. pylori infection   . HTN (hypertension) 04/17/2012  . Primary cancer of upper outer quadrant of right female breast ([place]) 03/08/2012    Rico Junker, PT, DPT 07/12/17    10:08 PM    [facility] [address] [place], [place], [address] Phone: [phone]   Fax:  [phone]  Name: Heather [name] MRN: [id number] Date of Birth: [birth year]

## 2017-07-13 ENCOUNTER — Ambulatory Visit: Payer: Medicare Other | Admitting: Physical Therapy

## 2017-07-13 DIAGNOSIS — H8111 Benign paroxysmal vertigo, right ear: Secondary | ICD-10-CM

## 2017-07-13 DIAGNOSIS — R42 Dizziness and giddiness: Secondary | ICD-10-CM | POA: Diagnosis not present

## 2017-07-13 DIAGNOSIS — R2689 Other abnormalities of gait and mobility: Secondary | ICD-10-CM | POA: Diagnosis not present

## 2017-07-13 DIAGNOSIS — R2681 Unsteadiness on feet: Secondary | ICD-10-CM | POA: Diagnosis not present

## 2017-07-13 NOTE — Therapy (Signed)
[facility] [address] [place], [place], [address] Phone: [phone]   Fax:  [phone]  Physical Therapy Treatment  Patient Details  Name: Heather [name] MRN: [id number] Date of Birth: Aug 22, 1945 Referring Provider: [name], MD   Encounter Date: 07/13/2017  PT End of Session - 07/13/17 2015    Visit Number  2    Number of Visits  17    Date for PT Re-Evaluation  09/10/17    Authorization Type  Medicare, BCBS    PT Start Time  1537    PT Stop Time  1615    PT Time Calculation (min)  38 min    Activity Tolerance  Patient tolerated treatment well    Behavior During Therapy  [facility] for tasks assessed/performed       Past Medical History:  Diagnosis Date  . Anemia    hx of  . Anxiety    r/t updated surgery  . Breast cancer ([place])    right  . Cataract    immature-not sure of which eye  . Complication of anesthesia    pt states she is sensitive to meds  . Dizziness    has been going on for 73month;medical MD aware  . Genetic testing 07/11/2016   Test Results: Normal; No pathogenic mutations detected  Genes Analyzed: 43 genes on [employer]'s Common Cancers panel (APC, ATM, AXIN2, BARD1, BMPR1A, BRCA1, BRCA2, BRIP1, CDH1, CDKN2A, CHEK2, DICER1, EPCAM, GREM1, HOXB13, KIT, MEN1, MLH1, MSH2, MSH6, MUTYH, NBN, NF1, PALB2, PDGFRA, PMS2, POLD1, POLE, PTEN, RAD50, RAD51C, RAD51D, SDHA, SDHB, SDHC, SDHD, SMAD4, SMARCA4, STK11, TP53, TSC1, TSC2, VHL).  .Marland KitchenGERD (gastroesophageal reflux disease)    Tums prn  . H. pylori infection   . H/O hiatal hernia   . Hemorrhoids   . History of UTI   . Hx of radiation therapy 10/25/12- 12/11/12   right chest wall/regional lymph nodes 5040 cGy, 28 sessions, right chest wall boost 1000 cGy 1 session  . Hx: UTI (urinary tract infection)   . Hyperlipidemia    but not on meds;diet and exercise controlled  . Hypertension    recently started Aldactone   . Insomnia    takes Melatoniin daily  .  Metastasis to spinal column (HCC)    T5, Left  Femur cancer- radiation   . Neuropathy    "from back surgery"  . PONV (postoperative nausea and vomiting)    pt experienced hair loss, confusion and combative- 02/15/15  . Right knee pain   . Right shoulder pain   . Skin cancer    squamous, Nodule to back - "same cancer as breast."  . Urinary frequency    d/t taking Aldactone    Past Surgical History:  Procedure Laterality Date  . COLONOSCOPY    . cyst removed from left breast  1970  . DECOMPRESSIVE LUMBAR LAMINECTOMY LEVEL 4 N/A 02/15/2015   Procedure: DECOMPRESSION T5 AND T3-T7 STABALIZATION;  Surgeon: NConsuella Lose MD;  [facility];  Service: Neurosurgery;  Laterality: N/A;  . ESOPHAGOGASTRODUODENOSCOPY    . LAMINECTOMY N/A 03/27/2015   Procedure: Thoracic Four-Thoracic Six Laminectomy for tumor;  Surgeon: NConsuella Lose MD;  [facility];  Service: Neurosurgery;  Laterality: N/A;  T4-T6 Laminectomy  . left wrist surgery   2004   with plate  . MASTECTOMY MODIFIED RADICAL  05/10/2012   Procedure: MASTECTOMY MODIFIED RADICAL;  Surgeon: PMerrie Roof MD;  [facility]  Service: General;  Laterality:  Right;  RIGHT MODIFIED RADICAL MASTECTOMY  . MASTECTOMY, RADICAL Right   . MINOR BREAST BIOPSY Left 05/16/2016   Procedure: EXCISION OF BACK NODULE;  Surgeon: Autumn Messing III, MD;  Location: [facility];  Service: General;  Laterality: Left;  . Nodule  back  05/17/2015  . PORTACATH PLACEMENT  05/10/2012   Procedure: INSERTION PORT-A-CATH;  Surgeon: Merrie Roof, MD;  Location: [facility];  Service: General;  Laterality: Left;    There were no vitals filed for this visit.  Subjective Assessment - 07/13/17 1539    Subjective  Pt reports having a mild earache L ear and nose bleed yesterday after evaluation.  Also had another episode of dizziness this morning but less severe than previously.    Pertinent History  metastatic breast CA with mets to  cerebellum, ribs, spine; T4 paraplegia due to metastatic lesions, neuropathy, HTN, osteopenia, CKD, OA    Limitations  Walking    Patient Stated Goals  Get rid of the dizziness "if that is realistic"    Currently in Pain?  No/denies             Vestibular Assessment - 07/13/17 1540      Positional Testing   Dix-Hallpike  Dix-Hallpike Right      Dix-Hallpike Right   Dix-Hallpike Right Duration  >1 minute    Dix-Hallpike Right Symptoms  Upbeat, right rotatory nystagmus               Vestibular Treatment/Exercise - 07/13/17 1547      Vestibular Treatment/Exercise   Vestibular Treatment Provided  Canalith Repositioning    Canalith Repositioning  [place] Procedure Right Posterior;Epley Manuever Right       EPLEY MANUEVER RIGHT   Number of Reps   2    Overall Response  Improved Symptoms      [place] Procedure Right Posterior   Number of Reps   1    Overall Response   Improved Symptoms    Response Details   converted to canalithiasis            PT Education - 07/13/17 2015    Education provided  Yes    Education Details  cupulolithiasis vs. canalithiasis    Person(s) Educated  Patient    Methods  Explanation    Comprehension  Verbalized understanding       PT Short Term Goals - 07/12/17 2155      PT SHORT TERM GOAL #1   Title  Pt will tolerate repositioning maneuvers for R posterior canal cupulolithiasis    Time  4    Period  Weeks    Status  New    Target Date  08/11/17      PT SHORT TERM GOAL #2   Title  Pt will participate in further gait and balance testing: gait velocity and FGA    Time  4    Period  Weeks    Status  New    Target Date  08/11/17      PT SHORT TERM GOAL #3   Title  Pt will initiate gaze adaptation and balance HEP with supervision    Time  4    Period  Weeks    Status  New    Target Date  08/11/17        PT Long Term Goals - 07/12/17 2158      PT LONG TERM GOAL #1   Title  Independent with vestibular and balance  HEP  Time  8    Period  Weeks    Status  New    Target Date  09/10/17      PT LONG TERM GOAL #2   Title  Pt will demonstrate negative positional vestibular testing    Baseline  R posterior canal cupulolithiasis    Time  8    Period  Weeks    Status  New    Target Date  09/10/17      PT LONG TERM GOAL #3   Title  Pt will improve use of VOR as indicated by DVA 2-3 line difference    Baseline  TBA    Time  8    Period  Weeks    Status  New    Target Date  09/10/17      PT LONG TERM GOAL #4   Title  Pt will improve gait velocity to >/= 3.6 ft/sec with cane for increased safety during community gait    Baseline  TBA    Time  8    Period  Weeks    Status  New    Target Date  09/10/17      PT LONG TERM GOAL #5   Title  Pt will improve FGA by 8 points    Baseline  TBA    Time  8    Period  Weeks    Status  New    Target Date  09/10/17      Additional Long Term Goals   Additional Long Term Goals  Yes      PT LONG TERM GOAL #6   Title  Pt will improve overall function as indicated by FOTO to >/= 60%    Baseline  51% function    Time  8    Period  Weeks    Status  New    Target Date  09/10/17            Plan - 07/13/17 2010    Clinical Impression Statement  Performed reassessment of peripheral canals; pt continues to present with upbeating R rotary nystagmus of long duration >1 minute.  After one liberatory maneuver, converted to upbeating, R rotary nystagmus of short duration indicating conversion to canalithiasis.  Performed CRM x 2 with improvement in symptoms.  Pt tolerated well.  Will continue to assess and address as needed to progress towards LTG.    Rehab Potential  Fair    Clinical Impairments Affecting Rehab Potential  currently undergoing chemotherapy for metastatic cancer, sensory changes affecting balance and vestibular system    PT Frequency  2x / week    PT Duration  8 weeks    PT Treatment/Interventions  ADLs/Self Care Home Management;Canalith  Repostioning;DME Instruction;Gait training;Functional mobility training;Therapeutic activities;Therapeutic exercise;Balance training;Neuromuscular re-education;Patient/family education;Vestibular    PT Next Visit Plan  reassess R posterior canal; assess DVA, gait velocity and FGA for balance.  Initiate HEP       Patient will benefit from skilled therapeutic intervention in order to improve the following deficits and impairments:  Decreased balance, Dizziness, Impaired sensation, Difficulty walking  Visit Diagnosis: BPPV (benign paroxysmal positional vertigo), right  Dizziness and giddiness     Problem List Patient Active Problem List   Diagnosis Date Noted  . Brain metastasis ([place]) 11/18/2016  . Genetic testing 07/11/2016  . ALLERGY TO CHLORHEXADINE, USE BETADINE ONLY 06/23/2016  . Kyphosis of thoracic region 06/13/2016  . Plantar fasciitis, bilateral 04/20/2016  . Neuropathic pain 01/27/2016  . Anemia of chronic  disease 05/21/2015  . Metastatic cancer to spine ([place]) 03/27/2015  . Thrombocytopenia ([place]) 03/12/2015  . Prerenal azotemia 03/12/2015  . Pain   . Recurrent UTI--due to Klebsiella   . Paraplegia at T4 level ([place]) 02/20/2015  . Neurogenic bowel 02/20/2015  . Neurogenic bladder 02/20/2015  . Postoperative anemia due to acute blood loss 02/17/2015  . Spinal cord compression due to malignant neoplasm metastatic to spine ([place]) 02/15/2015  . Epidural mass 02/15/2015  . Thoracic spine tumor   . Pathologic fracture of vertebra 02/14/2015  . Pathologic fracture of thoracic vertebrae 02/14/2015  . Breast cancer metastasized to bone, right ([place]) 02/14/2015  . Hyperlipidemia 02/14/2015  . H. pylori infection   . HTN (hypertension) 04/17/2012  . Primary cancer of upper outer quadrant of right female breast ([place]) 03/08/2012    Rico Junker, PT, DPT 07/13/17    8:21 PM    [facility] [address] [place], [place], [address] Phone: [phone]   Fax:  [phone]  Name: Heather [name] MRN: [id number] Date of Birth: March 24, 1946

## 2017-07-19 ENCOUNTER — Encounter: Payer: Self-pay | Admitting: Physical Therapy

## 2017-07-19 ENCOUNTER — Ambulatory Visit: Payer: Medicare Other | Admitting: Physical Therapy

## 2017-07-19 DIAGNOSIS — R2689 Other abnormalities of gait and mobility: Secondary | ICD-10-CM | POA: Diagnosis not present

## 2017-07-19 DIAGNOSIS — R2681 Unsteadiness on feet: Secondary | ICD-10-CM

## 2017-07-19 DIAGNOSIS — H8111 Benign paroxysmal vertigo, right ear: Secondary | ICD-10-CM

## 2017-07-19 DIAGNOSIS — R42 Dizziness and giddiness: Secondary | ICD-10-CM | POA: Diagnosis not present

## 2017-07-19 NOTE — Patient Instructions (Signed)
Sit to Side-Lying    Sit on edge of bed. 1. Turn head 45 to lefy. 2. Maintain head position and lie down as quickly as possible on right side. Hold until symptoms subside plus 30 seconds. 3. Sit up slowly. Hold until symptoms subside plus 30 seconds. 4. Turn head 45 to right. 5. Maintain head position and lie down as quickly as possible on left side. Hold until symptoms subside plus 30 seconds. 6. Sit up slowly.  Hold until symptoms subside plus 30 seconds.      Repeat sequence __3__ times per session. Do _3___ sessions per day.  You may stop this exercise when you go 2 full days symptoms free.  Copyright  VHI. All rights reserved.

## 2017-07-19 NOTE — Therapy (Signed)
[facility] [address] [place], [place], [address] Phone: [phone]   Fax:  [phone]  Physical Therapy Treatment  Patient Details  Name: Heather [name] MRN: [id number] Date of Birth: 1945-08-05 Referring Provider: [name], MD   Encounter Date: 07/19/2017  PT End of Session - 07/19/17 0841    Visit Number  3    Number of Visits  17    Date for PT Re-Evaluation  09/10/17    Authorization Type  Medicare, BCBS    PT Start Time  0800    PT Stop Time  0840    PT Time Calculation (min)  40 min    Activity Tolerance  Patient tolerated treatment well    Behavior During Therapy  [facility] for tasks assessed/performed       Past Medical History:  Diagnosis Date  . Anemia    hx of  . Anxiety    r/t updated surgery  . Breast cancer ([place])    right  . Cataract    immature-not sure of which eye  . Complication of anesthesia    pt states she is sensitive to meds  . Dizziness    has been going on for 78month;medical MD aware  . Genetic testing 07/11/2016   Test Results: Normal; No pathogenic mutations detected  Genes Analyzed: 43 genes on [employer]'s Common Cancers panel (APC, ATM, AXIN2, BARD1, BMPR1A, BRCA1, BRCA2, BRIP1, CDH1, CDKN2A, CHEK2, DICER1, EPCAM, GREM1, HOXB13, KIT, MEN1, MLH1, MSH2, MSH6, MUTYH, NBN, NF1, PALB2, PDGFRA, PMS2, POLD1, POLE, PTEN, RAD50, RAD51C, RAD51D, SDHA, SDHB, SDHC, SDHD, SMAD4, SMARCA4, STK11, TP53, TSC1, TSC2, VHL).  .Marland KitchenGERD (gastroesophageal reflux disease)    Tums prn  . H. pylori infection   . H/O hiatal hernia   . Hemorrhoids   . History of UTI   . Hx of radiation therapy 10/25/12- 12/11/12   right chest wall/regional lymph nodes 5040 cGy, 28 sessions, right chest wall boost 1000 cGy 1 session  . Hx: UTI (urinary tract infection)   . Hyperlipidemia    but not on meds;diet and exercise controlled  . Hypertension    recently started Aldactone   . Insomnia    takes Melatoniin daily   . Metastasis to spinal column (HCC)    T5, Left  Femur cancer- radiation   . Neuropathy    "from back surgery"  . PONV (postoperative nausea and vomiting)    pt experienced hair loss, confusion and combative- 02/15/15  . Right knee pain   . Right shoulder pain   . Skin cancer    squamous, Nodule to back - "same cancer as breast."  . Urinary frequency    d/t taking Aldactone    Past Surgical History:  Procedure Laterality Date  . COLONOSCOPY    . cyst removed from left breast  1970  . DECOMPRESSIVE LUMBAR LAMINECTOMY LEVEL 4 N/A 02/15/2015   Procedure: DECOMPRESSION T5 AND T3-T7 STABALIZATION;  Surgeon: NConsuella Lose MD;  [facility];  Service: Neurosurgery;  Laterality: N/A;  . ESOPHAGOGASTRODUODENOSCOPY    . LAMINECTOMY N/A 03/27/2015   Procedure: Thoracic Four-Thoracic Six Laminectomy for tumor;  Surgeon: NConsuella Lose MD;  [facility];  Service: Neurosurgery;  Laterality: N/A;  T4-T6 Laminectomy  . left wrist surgery   2004   with plate  . MASTECTOMY MODIFIED RADICAL  05/10/2012   Procedure: MASTECTOMY MODIFIED RADICAL;  Surgeon: PMerrie Roof MD;  [facility]  Service: General;  Laterality:  Right;  RIGHT MODIFIED RADICAL MASTECTOMY  . MASTECTOMY, RADICAL Right   . MINOR BREAST BIOPSY Left 05/16/2016   Procedure: EXCISION OF BACK NODULE;  Surgeon: Autumn Messing III, MD;  Location: [facility] [facility];  Service: General;  Laterality: Left;  . Nodule  back  05/17/2015  . PORTACATH PLACEMENT  05/10/2012   Procedure: INSERTION PORT-A-CATH;  Surgeon: Merrie Roof, MD;  Location: [facility];  Service: General;  Laterality: Left;    There were no vitals filed for this visit.  Subjective Assessment - 07/19/17 0758    Subjective  hasn't had any spinning since last session    Pertinent History  metastatic breast CA with mets to cerebellum, ribs, spine; T4 paraplegia due to metastatic lesions, neuropathy, HTN, osteopenia, CKD, OA    Patient Stated  Goals  Get rid of the dizziness "if that is realistic"    Currently in Pain?  No/denies             Vestibular Assessment - 07/19/17 0800      Vestibular Assessment   General Observation  no symptoms since last session      Dix-Hallpike Right   Dix-Hallpike Right Duration  latent onset (>30 sec); lasting ~ 15 sec    Dix-Hallpike Right Symptoms  Upbeat, right rotatory nystagmus              [facility] Adult PT Treatment/Exercise - 07/19/17 0840      Self-Care   Self-Care  Other Self-Care Comments    Other Self-Care Comments   long discussion with pt about motivation and current medical situation.  Pt reports feeling defeated with 3 month scans that continue to show disease progression of cancer.  Advised pt to discussion palliative care consult with oncologist to help navigate goals of care moving forward.  Pt verbalized understanding.      Vestibular Treatment/Exercise - 07/19/17 0001      Vestibular Treatment/Exercise   Vestibular Treatment Provided  Canalith Repositioning    Habituation Exercises  Brandt Daroff       EPLEY MANUEVER RIGHT   Number of Reps   2    Overall Response  Improved Symptoms    Response Details   vibration used 1st rep      Nestor Lewandowsky   Number of Reps   1    Symptom Description   instructed in home exercise            PT Education - 07/19/17 0841    Education provided  Yes    Education Details  brandt daroff    Person(s) Educated  Patient    Methods  Explanation;Demonstration    Comprehension  Verbalized understanding;Returned demonstration       PT Short Term Goals - 07/12/17 2155      PT SHORT TERM GOAL #1   Title  Pt will tolerate repositioning maneuvers for R posterior canal cupulolithiasis    Time  4    Period  Weeks    Status  New    Target Date  08/11/17      PT SHORT TERM GOAL #2   Title  Pt will participate in further gait and balance testing: gait velocity and FGA    Time  4    Period  Weeks    Status  New     Target Date  08/11/17      PT SHORT TERM GOAL #3   Title  Pt will initiate gaze adaptation and balance HEP with  supervision    Time  4    Period  Weeks    Status  New    Target Date  08/11/17        PT Long Term Goals - 07/12/17 2158      PT LONG TERM GOAL #1   Title  Independent with vestibular and balance HEP    Time  8    Period  Weeks    Status  New    Target Date  09/10/17      PT LONG TERM GOAL #2   Title  Pt will demonstrate negative positional vestibular testing    Baseline  R posterior canal cupulolithiasis    Time  8    Period  Weeks    Status  New    Target Date  09/10/17      PT LONG TERM GOAL #3   Title  Pt will improve use of VOR as indicated by DVA 2-3 line difference    Baseline  TBA    Time  8    Period  Weeks    Status  New    Target Date  09/10/17      PT LONG TERM GOAL #4   Title  Pt will improve gait velocity to >/= 3.6 ft/sec with cane for increased safety during community gait    Baseline  TBA    Time  8    Period  Weeks    Status  New    Target Date  09/10/17      PT LONG TERM GOAL #5   Title  Pt will improve FGA by 8 points    Baseline  TBA    Time  8    Period  Weeks    Status  New    Target Date  09/10/17      Additional Long Term Goals   Additional Long Term Goals  Yes      PT LONG TERM GOAL #6   Title  Pt will improve overall function as indicated by FOTO to >/= 60%    Baseline  51% function    Time  8    Period  Weeks    Status  New    Target Date  09/10/17            Plan - 07/19/17 7564    Clinical Impression Statement  Pt continues to demonstrate persistent Rt pBPPV likely still canalithiasis at this time.  Pt provided with Brandt-Daroff today to help reposition otoconia at home.  Will cotninue to benefit from PT to maximize function, and recommended she discuss options to meet with someone for goals of care meeting regarding progression of cancer.    PT Treatment/Interventions  ADLs/Self Care Home  Management;Canalith Repostioning;DME Instruction;Gait training;Functional mobility training;Therapeutic activities;Therapeutic exercise;Balance training;Neuromuscular re-education;Patient/family education;Vestibular    PT Next Visit Plan  reassess R posterior canal; assess DVA, gait velocity and FGA for balance.  Initiate HEP    Consulted and Agree with Plan of Care  Patient       Patient will benefit from skilled therapeutic intervention in order to improve the following deficits and impairments:  Decreased balance, Dizziness, Impaired sensation, Difficulty walking  Visit Diagnosis: BPPV (benign paroxysmal positional vertigo), right  Dizziness and giddiness  Unsteadiness on feet  Other abnormalities of gait and mobility     Problem List Patient Active Problem List   Diagnosis Date Noted  . Brain metastasis ([place]) 11/18/2016  . Genetic testing 07/11/2016  .  ALLERGY TO CHLORHEXADINE, USE BETADINE ONLY 06/23/2016  . Kyphosis of thoracic region 06/13/2016  . Plantar fasciitis, bilateral 04/20/2016  . Neuropathic pain 01/27/2016  . Anemia of chronic disease 05/21/2015  . Metastatic cancer to spine ([place]) 03/27/2015  . Thrombocytopenia ([place]) 03/12/2015  . Prerenal azotemia 03/12/2015  . Pain   . Recurrent UTI--due to Klebsiella   . Paraplegia at T4 level ([place]) 02/20/2015  . Neurogenic bowel 02/20/2015  . Neurogenic bladder 02/20/2015  . Postoperative anemia due to acute blood loss 02/17/2015  . Spinal cord compression due to malignant neoplasm metastatic to spine ([place]) 02/15/2015  . Epidural mass 02/15/2015  . Thoracic spine tumor   . Pathologic fracture of vertebra 02/14/2015  . Pathologic fracture of thoracic vertebrae 02/14/2015  . Breast cancer metastasized to bone, right ([place]) 02/14/2015  . Hyperlipidemia 02/14/2015  . H. pylori infection   . HTN (hypertension) 04/17/2012  . Primary cancer of upper outer quadrant of right female breast ([place]) 03/08/2012       Laureen Abrahams, PT, DPT 07/19/17 8:44 AM     [facility] [address] [place], [place], [address] Phone: [phone]   Fax:  [phone]  Name: Heather [name] MRN: [id number] Date of Birth: Apr 12, 1946

## 2017-07-21 ENCOUNTER — Ambulatory Visit: Payer: Medicare Other | Admitting: Physical Therapy

## 2017-07-21 ENCOUNTER — Encounter: Payer: Self-pay | Admitting: Physical Therapy

## 2017-07-21 DIAGNOSIS — R2681 Unsteadiness on feet: Secondary | ICD-10-CM

## 2017-07-21 DIAGNOSIS — H8111 Benign paroxysmal vertigo, right ear: Secondary | ICD-10-CM | POA: Diagnosis not present

## 2017-07-21 DIAGNOSIS — R2689 Other abnormalities of gait and mobility: Secondary | ICD-10-CM

## 2017-07-21 DIAGNOSIS — R42 Dizziness and giddiness: Secondary | ICD-10-CM

## 2017-07-21 NOTE — Therapy (Addendum)
[facility] [address] [place], [place], [address] Phone: [phone]   Fax:  [phone]  Physical Therapy Treatment  Patient Details  Name: Heather [name] MRN: [id number] Date of Birth: 09/12/1945 Referring Provider: [name], MD   Encounter Date: 07/21/2017  PT End of Session - 07/21/17 0852    Visit Number  4    Number of Visits  17    Date for PT Re-Evaluation  09/10/17    Authorization Type  Medicare, BCBS    PT Start Time  0804    PT Stop Time  0848    PT Time Calculation (min)  44 min    Activity Tolerance  Patient tolerated treatment well    Behavior During Therapy  [facility] for tasks assessed/performed       Past Medical History:  Diagnosis Date  . Anemia    hx of  . Anxiety    r/t updated surgery  . Breast cancer ([place])    right  . Cataract    immature-not sure of which eye  . Complication of anesthesia    pt states she is sensitive to meds  . Dizziness    has been going on for 49month;medical MD aware  . Genetic testing 07/11/2016   Test Results: Normal; No pathogenic mutations detected  Genes Analyzed: 43 genes on [employer]'s Common Cancers panel (APC, ATM, AXIN2, BARD1, BMPR1A, BRCA1, BRCA2, BRIP1, CDH1, CDKN2A, CHEK2, DICER1, EPCAM, GREM1, HOXB13, KIT, MEN1, MLH1, MSH2, MSH6, MUTYH, NBN, NF1, PALB2, PDGFRA, PMS2, POLD1, POLE, PTEN, RAD50, RAD51C, RAD51D, SDHA, SDHB, SDHC, SDHD, SMAD4, SMARCA4, STK11, TP53, TSC1, TSC2, VHL).  .Marland KitchenGERD (gastroesophageal reflux disease)    Tums prn  . H. pylori infection   . H/O hiatal hernia   . Hemorrhoids   . History of UTI   . Hx of radiation therapy 10/25/12- 12/11/12   right chest wall/regional lymph nodes 5040 cGy, 28 sessions, right chest wall boost 1000 cGy 1 session  . Hx: UTI (urinary tract infection)   . Hyperlipidemia    but not on meds;diet and exercise controlled  . Hypertension    recently started Aldactone   . Insomnia    takes Melatoniin daily   . Metastasis to spinal column (HCC)    T5, Left  Femur cancer- radiation   . Neuropathy    "from back surgery"  . PONV (postoperative nausea and vomiting)    pt experienced hair loss, confusion and combative- 02/15/15  . Right knee pain   . Right shoulder pain   . Skin cancer    squamous, Nodule to back - "same cancer as breast."  . Urinary frequency    d/t taking Aldactone    Past Surgical History:  Procedure Laterality Date  . COLONOSCOPY    . cyst removed from left breast  1970  . DECOMPRESSIVE LUMBAR LAMINECTOMY LEVEL 4 N/A 02/15/2015   Procedure: DECOMPRESSION T5 AND T3-T7 STABALIZATION;  Surgeon: NConsuella Lose MD;  [facility];  Service: Neurosurgery;  Laterality: N/A;  . ESOPHAGOGASTRODUODENOSCOPY    . LAMINECTOMY N/A 03/27/2015   Procedure: Thoracic Four-Thoracic Six Laminectomy for tumor;  Surgeon: NConsuella Lose MD;  [facility];  Service: Neurosurgery;  Laterality: N/A;  T4-T6 Laminectomy  . left wrist surgery   2004   with plate  . MASTECTOMY MODIFIED RADICAL  05/10/2012   Procedure: MASTECTOMY MODIFIED RADICAL;  Surgeon: PMerrie Roof MD;  [facility]  Service: General;  Laterality:  Right;  RIGHT MODIFIED RADICAL MASTECTOMY  . MASTECTOMY, RADICAL Right   . MINOR BREAST BIOPSY Left 05/16/2016   Procedure: EXCISION OF BACK NODULE;  Surgeon: Autumn Messing III, MD;  Location: [facility];  Service: General;  Laterality: Left;  . Nodule  back  05/17/2015  . PORTACATH PLACEMENT  05/10/2012   Procedure: INSERTION PORT-A-CATH;  Surgeon: Merrie Roof, MD;  Location: [facility];  Service: General;  Laterality: Left;    There were no vitals filed for this visit.  Subjective Assessment - 07/21/17 0806    Subjective  Pt reports decreased frequency of dizziness since treatment with vibration and addition of Brandt Daroff to HEP.  Having some neck soreness after performing exercises.    Pertinent History  metastatic breast CA with mets to  cerebellum, ribs, spine; T4 paraplegia due to metastatic lesions, neuropathy, HTN, osteopenia, CKD, OA    Patient Stated Goals  Get rid of the dizziness "if that is realistic"    Currently in Pain?  Yes    Pain Location  Back    Pain Orientation  Lower    Pain Descriptors / Indicators  Discomfort    Pain Type  Chronic pain         [facility] PT Assessment - 07/21/17 0810      Standardized Balance Assessment   Standardized Balance Assessment  10 meter walk test    10 Meter Walk  10.66 or 3.07 ft/sec with cane      Functional Gait  Assessment   Gait assessed   Yes    Gait Level Surface  Walks 20 ft in less than 7 sec but greater than 5.5 sec, uses assistive device, slower speed, mild gait deviations, or deviates 6-10 in outside of the 12 in walkway width.    Change in Gait Speed  Able to change speed, demonstrates mild gait deviations, deviates 6-10 in outside of the 12 in walkway width, or no gait deviations, unable to achieve a major change in velocity, or uses a change in velocity, or uses an assistive device.    Gait with Horizontal Head Turns  Performs head turns smoothly with slight change in gait velocity (eg, minor disruption to smooth gait path), deviates 6-10 in outside 12 in walkway width, or uses an assistive device.    Gait with Vertical Head Turns  Performs task with slight change in gait velocity (eg, minor disruption to smooth gait path), deviates 6 - 10 in outside 12 in walkway width or uses assistive device    Gait and Pivot Turn  Pivot turns safely in greater than 3 sec and stops with no loss of balance, or pivot turns safely within 3 sec and stops with mild imbalance, requires small steps to catch balance.    Step Over Obstacle  Is able to step over one shoe box (4.5 in total height) but must slow down and adjust steps to clear box safely. May require verbal cueing.    Gait with Narrow Base of Support  Ambulates less than 4 steps heel to toe or cannot perform without assistance.     Gait with Eyes Closed  Walks 20 ft, slow speed, abnormal gait pattern, evidence for imbalance, deviates 10-15 in outside 12 in walkway width. Requires more than 9 sec to ambulate 20 ft.    Ambulating Backwards  Walks 20 ft, uses assistive device, slower speed, mild gait deviations, deviates 6-10 in outside 12 in walkway width.    Steps  Alternating  feet, must use rail.    Total Score  16    FGA comment:  16/30         Vestibular Assessment - 07/21/17 0833      Visual Acuity   Static  7    Dynamic  7 but symptomatic for dizziness afterwards              [facility] Adult PT Treatment/Exercise - 07/21/17 0834      Therapeutic Activites    Therapeutic Activities  Other Therapeutic Activities    Other Therapeutic Activities  Pt reports worsening peripheral neuropathy and is starting to experience L foot drag when ambulating without cane             PT Education - 07/21/17 0840    Education provided  Yes    Education Details  foot up brace, findings of FGA/DVA/Gait velocity, keep habituation exercises at 2x/day due to neck soreness    Person(s) Educated  Patient    Methods  Explanation;Demonstration    Comprehension  Verbalized understanding       PT Short Term Goals - 07/21/17 0858      PT SHORT TERM GOAL #1   Title  Pt will tolerate repositioning maneuvers for R posterior canal cupulolithiasis    Time  4    Period  Weeks    Status  Achieved      PT SHORT TERM GOAL #2   Title  Pt will participate in further gait and balance testing: gait velocity and FGA    Time  4    Period  Weeks    Status  Achieved      PT SHORT TERM GOAL #3   Title  Pt will initiate gaze adaptation and balance HEP with supervision    Time  4    Period  Weeks    Status  On-going    Target Date  08/11/17        PT Long Term Goals - 07/21/17 0858      PT LONG TERM GOAL #1   Title  Independent with vestibular and balance HEP    Time  8    Period  Weeks    Status  New    Target  Date  09/10/17      PT LONG TERM GOAL #2   Title  Pt will demonstrate negative positional vestibular testing    Baseline  R posterior canal cupulolithiasis    Time  8    Period  Weeks    Status  New    Target Date  09/10/17      PT LONG TERM GOAL #3   Title  Pt will improve use of VOR as indicated by DVA 2-3 line difference    Baseline  deferred; pt demonstrated 0 line difference when assessed.    Status  Deferred      PT LONG TERM GOAL #4   Title  Pt will improve gait velocity to >/= 3.6 ft/sec with cane for increased safety during community gait    Baseline  3.07 ft/sec    Time  8    Period  Weeks    Status  Revised    Target Date  09/10/17      PT LONG TERM GOAL #5   Title  Pt will improve FGA by 4 points; using cane to perform as needed    Baseline  16/30    Time  8    Period  Weeks  Status  Revised    Target Date  09/10/17      PT LONG TERM GOAL #6   Title  Pt will improve overall function as indicated by FOTO to >/= 60%    Baseline  51% function    Time  8    Period  Weeks    Status  New            Plan - 07/21/17 0841    Clinical Impression Statement  Due to improvement in dizziness today focused session on assessment of balance, gait and falls risk.  Pt did not demonstrate impaired functional VOR as indicated by 0 line difference on DVA but demonstrated significant motion sensitivity and dizzines to repetitive head turns.  Pt's FGA indicates pt is at risk for falling during more dynamic community gait challenges, even with use of cane.  Pt also reports increased difficulty maintaining balance when ambulating in more distracting environments and crowded environments.  Pt also reports L foot drag and L foot catching when ambulating without cane.  Discussed and demonstrated to pt use of foot up brace for DF assist and to prevent falls; also discussed with pt having discussion with oncologist her concerns about side effects of chemotherapy, falls risk and quality  of life.  Pt advised to keep performing habituation but at 2x/day due to neck soreness.  At next session will initiate balance HEP if dizziness remains mild.  Will continue to progress towards LTG.    PT Treatment/Interventions  ADLs/Self Care Home Management;Canalith Repostioning;DME Instruction;Gait training;Functional mobility training;Therapeutic activities;Therapeutic exercise;Balance training;Neuromuscular re-education;Patient/family education;Vestibular    PT Next Visit Plan  reassess R posterior canal and treat as indicated.  Foot up brace with lace up shoes.  Initiate balance HEP    Consulted and Agree with Plan of Care  Patient       Patient will benefit from skilled therapeutic intervention in order to improve the following deficits and impairments:  Decreased balance, Dizziness, Impaired sensation, Difficulty walking  Visit Diagnosis: Dizziness and giddiness  Unsteadiness on feet  Other abnormalities of gait and mobility     Problem List Patient Active Problem List   Diagnosis Date Noted  . Brain metastasis ([place]) 11/18/2016  . Genetic testing 07/11/2016  . ALLERGY TO CHLORHEXADINE, USE BETADINE ONLY 06/23/2016  . Kyphosis of thoracic region 06/13/2016  . Plantar fasciitis, bilateral 04/20/2016  . Neuropathic pain 01/27/2016  . Anemia of chronic disease 05/21/2015  . Metastatic cancer to spine ([place]) 03/27/2015  . Thrombocytopenia ([place]) 03/12/2015  . Prerenal azotemia 03/12/2015  . Pain   . Recurrent UTI--due to Klebsiella   . Paraplegia at T4 level ([place]) 02/20/2015  . Neurogenic bowel 02/20/2015  . Neurogenic bladder 02/20/2015  . Postoperative anemia due to acute blood loss 02/17/2015  . Spinal cord compression due to malignant neoplasm metastatic to spine ([place]) 02/15/2015  . Epidural mass 02/15/2015  . Thoracic spine tumor   . Pathologic fracture of vertebra 02/14/2015  . Pathologic fracture of thoracic vertebrae 02/14/2015  . Breast cancer metastasized to  bone, right ([place]) 02/14/2015  . Hyperlipidemia 02/14/2015  . H. pylori infection   . HTN (hypertension) 04/17/2012  . Primary cancer of upper outer quadrant of right female breast ([place]) 03/08/2012    Rico Junker, PT, DPT 07/21/17    9:02 AM    [facility] [address] [place], [place], [address] Phone: [phone]   Fax:  [phone]  Name: Heather [name] MRN:  [id number] Date of Birth: 1945-10-29

## 2017-07-24 ENCOUNTER — Ambulatory Visit: Payer: Medicare Other | Admitting: Physical Therapy

## 2017-07-24 ENCOUNTER — Encounter: Payer: Self-pay | Admitting: Physical Therapy

## 2017-07-24 DIAGNOSIS — R2681 Unsteadiness on feet: Secondary | ICD-10-CM

## 2017-07-24 DIAGNOSIS — H8111 Benign paroxysmal vertigo, right ear: Secondary | ICD-10-CM | POA: Diagnosis not present

## 2017-07-24 DIAGNOSIS — R2689 Other abnormalities of gait and mobility: Secondary | ICD-10-CM | POA: Diagnosis not present

## 2017-07-24 DIAGNOSIS — R42 Dizziness and giddiness: Secondary | ICD-10-CM

## 2017-07-24 NOTE — Patient Instructions (Signed)
Gaze Stabilization - Tip Card  1.Target must remain in focus, not blurry, and appear stationary while head is in motion. 2.Perform exercises with small head movements (45 to either side of midline). 3.Increase speed of head motion so long as target is in focus. 4.If you wear eyeglasses, be sure you can see target through lens (therapist will give specific instructions for bifocal / progressive lenses). 5.These exercises may provoke dizziness or nausea. Work through these symptoms. If too dizzy, slow head movement slightly. Rest between each exercise. 6.Exercises demand concentration; avoid distractions. 7.For safety, perform standing exercises close to a counter, wall, corner, or next to someone.  Copyright  VHI. All rights reserved.   Gaze Stabilization - Standing Feet Apart   Feet shoulder width apart and a chair in front for stability, keeping eyes on target on wall 3 feet away, tilt head down slightly and move head side to side for 30 seconds. Repeat while moving head up and down for 30 seconds. *Work up to tolerating 60 seconds, as able. Do 2-3 sessions per day.   Feet Heel-Toe "Tandem"    One hand touching counter top, walk a straight line bringing one foot directly in front of the other. Repeat for _4 laps per session. Do _2___ sessions per day.  Copyright  VHI. All rights reserved.

## 2017-07-24 NOTE — Therapy (Signed)
[facility] [address] [place], [place], [address] Phone: [phone]   Fax:  [phone]  Physical Therapy Treatment  Patient Details  Name: Heather [name] MRN: [id number] Date of Birth: 06/27/1945 Referring Provider: [name], MD   Encounter Date: 07/24/2017  PT End of Session - 07/24/17 1342    Visit Number  5    Number of Visits  17    Date for PT Re-Evaluation  09/10/17    Authorization Type  Medicare, BCBS    PT Start Time  0802    PT Stop Time  0846    PT Time Calculation (min)  44 min    Activity Tolerance  Patient tolerated treatment well    Behavior During Therapy  [facility] for tasks assessed/performed       Past Medical History:  Diagnosis Date  . Anemia    hx of  . Anxiety    r/t updated surgery  . Breast cancer ([place])    right  . Cataract    immature-not sure of which eye  . Complication of anesthesia    pt states she is sensitive to meds  . Dizziness    has been going on for 5month;medical MD aware  . Genetic testing 07/11/2016   Test Results: Normal; No pathogenic mutations detected  Genes Analyzed: 43 genes on [employer]'s Common Cancers panel (APC, ATM, AXIN2, BARD1, BMPR1A, BRCA1, BRCA2, BRIP1, CDH1, CDKN2A, CHEK2, DICER1, EPCAM, GREM1, HOXB13, KIT, MEN1, MLH1, MSH2, MSH6, MUTYH, NBN, NF1, PALB2, PDGFRA, PMS2, POLD1, POLE, PTEN, RAD50, RAD51C, RAD51D, SDHA, SDHB, SDHC, SDHD, SMAD4, SMARCA4, STK11, TP53, TSC1, TSC2, VHL).  .Marland KitchenGERD (gastroesophageal reflux disease)    Tums prn  . H. pylori infection   . H/O hiatal hernia   . Hemorrhoids   . History of UTI   . Hx of radiation therapy 10/25/12- 12/11/12   right chest wall/regional lymph nodes 5040 cGy, 28 sessions, right chest wall boost 1000 cGy 1 session  . Hx: UTI (urinary tract infection)   . Hyperlipidemia    but not on meds;diet and exercise controlled  . Hypertension    recently started Aldactone   . Insomnia    takes Melatoniin daily    . Metastasis to spinal column (H[facility])    T5, Left  Femur cancer- radiation   . Neuropathy    "from back surgery"  . PONV (postoperative nausea and vomiting)    pt experienced hair loss, confusion and combative- 02/15/15  . Right knee pain   . Right shoulder pain   . Skin cancer    squamous, Nodule to back - "same cancer as breast."  . Urinary frequency    d/t taking Aldactone    Past Surgical History:  Procedure Laterality Date  . COLONOSCOPY    . cyst removed from left breast  1970  . DECOMPRESSIVE LUMBAR LAMINECTOMY LEVEL 4 N/A 02/15/2015   Procedure: DECOMPRESSION T5 AND T3-T7 STABALIZATION;  Surgeon: NConsuella Lose MD;  [facility];  Service: Neurosurgery;  Laterality: N/A;  . ESOPHAGOGASTRODUODENOSCOPY    . LAMINECTOMY N/A 03/27/2015   Procedure: Thoracic Four-Thoracic Six Laminectomy for tumor;  Surgeon: NConsuella Lose MD;  [facility];  Service: Neurosurgery;  Laterality: N/A;  T4-T6 Laminectomy  . left wrist surgery   2004   with plate  . MASTECTOMY MODIFIED RADICAL  05/10/2012   Procedure: MASTECTOMY MODIFIED RADICAL;  Surgeon: PMerrie Roof MD;  [facility]  Service: General;  Laterality: Right;  RIGHT MODIFIED RADICAL MASTECTOMY  . MASTECTOMY, RADICAL Right   . MINOR BREAST BIOPSY Left 05/16/2016   Procedure: EXCISION OF BACK NODULE;  Surgeon: Autumn Messing III, MD;  Location: [facility];  Service: General;  Laterality: Left;  . Nodule  back  05/17/2015  . PORTACATH PLACEMENT  05/10/2012   Procedure: INSERTION PORT-A-CATH;  Surgeon: Merrie Roof, MD;  Location: [facility];  Service: General;  Laterality: Left;    There were no vitals filed for this visit.  Subjective Assessment - 07/24/17 0804    Subjective  Went to a concert yesterday and when leaving she felt insecure walking in a large crowd.  Neck soreness is better with decreased frequency of habituation.  Got up quickly yesterday to go to the bathroom and felt a little  dizzy but little to no dizziness with habituation exercises.    Pertinent History  metastatic breast CA with mets to cerebellum, ribs, spine; T4 paraplegia due to metastatic lesions, neuropathy, HTN, osteopenia, CKD, OA    Patient Stated Goals  Get rid of the dizziness "if that is realistic"    Currently in Pain?  No/denies                      [facility] Adult PT Treatment/Exercise - 07/24/17 0806      Ambulation/Gait   Ambulation/Gait  Yes    Ambulation/Gait Assistance  5: Supervision    Ambulation/Gait Assistance Details  trial with foot up brace to assess ability to prevent foot drag and improve efficiency of gait; also performed R and L turns with use of visual fixation for improved stability when changing directions.  Pt reported decreased foot drag even when fatigued, less dizziness with turns but did report increased sensation of burning on L lower leg due to cuff around ankle    Ambulation Distance (Feet)  500 Feet    Assistive device  Straight cane;None    Ambulation Surface  Level;Indoor    Stairs  Yes    Stairs Assistance  5: Supervision    Stairs Assistance Details (indicate cue type and reason)  with foot up brace    Stair Management Technique  One rail Left;Alternating pattern;Forwards    Number of Stairs  4    Height of Stairs  6    Ramp  5: Supervision    Ramp Details (indicate cue type and reason)  with cane and foot up brace    Curb  5: Supervision    Curb Details (indicate cue type and reason)  with cane and foot up brace    Gait Comments  trial use of Foot up brace on LLE      Self-Care   Self-Care  Other Self-Care Comments    Other Self-Care Comments   Recommendations when in a crowded community environment: waiting for most of the crowd to disperse before exiting.  When transitioning sit > stand and when making turns quickly using gaze fixation for stability and also standing for a few seconds before starting to walk ot allow BP to regulate.       Vestibular Treatment/Exercise - 07/24/17 0835      Vestibular Treatment/Exercise   Vestibular Treatment Provided  Gaze    Gaze Exercises  X1 Viewing Horizontal;X1 Viewing Vertical      X1 Viewing Horizontal   Foot Position  standing, feet apart, hands holding chair    Reps  2    Comments  30 seconds;  mild symptoms      X1 Viewing Vertical   Foot Position  standing, feet apart, hands holding chair    Reps  2    Comments  30 seconds         Balance Exercises - 07/24/17 0846      Balance Exercises: Standing   Tandem Gait  Forward;Intermittent upper extremity support;2 reps with foot up brace donned       Gaze Stabilization - Tip Card  1.Target must remain in focus, not blurry, and appear stationary while head is in motion. 2.Perform exercises with small head movements (45 to either side of midline). 3.Increase speed of head motion so long as target is in focus. 4.If you wear eyeglasses, be sure you can see target through lens (therapist will give specific instructions for bifocal / progressive lenses). 5.These exercises may provoke dizziness or nausea. Work through these symptoms. If too dizzy, slow head movement slightly. Rest between each exercise. 6.Exercises demand concentration; avoid distractions. 7.For safety, perform standing exercises close to a counter, wall, corner, or next to someone.  Copyright  VHI. All rights reserved.   Gaze Stabilization - Standing Feet Apart   Feet shoulder width apart and a chair in front for stability, keeping eyes on target on wall 3 feet away, tilt head down slightly and move head side to side for 30 seconds. Repeat while moving head up and down for 30 seconds. *Work up to tolerating 60 seconds, as able. Do 2-3 sessions per day.   Feet Heel-Toe "Tandem"    One hand touching counter top, walk a straight line bringing one foot directly in front of the other. Repeat for _4 laps per session. Do _2___ sessions per  day.  Copyright  VHI. All rights reserved.   PT Education - 07/24/17 1341    Education provided  Yes    Education Details  foot up brace, visual fixation, HEP    Person(s) Educated  Patient    Methods  Explanation;Demonstration;Handout    Comprehension  Verbalized understanding;Returned demonstration       PT Short Term Goals - 07/21/17 0858      PT SHORT TERM GOAL #1   Title  Pt will tolerate repositioning maneuvers for R posterior canal cupulolithiasis    Time  4    Period  Weeks    Status  Achieved      PT SHORT TERM GOAL #2   Title  Pt will participate in further gait and balance testing: gait velocity and FGA    Time  4    Period  Weeks    Status  Achieved      PT SHORT TERM GOAL #3   Title  Pt will initiate gaze adaptation and balance HEP with supervision    Time  4    Period  Weeks    Status  On-going    Target Date  08/11/17        PT Long Term Goals - 07/21/17 0858      PT LONG TERM GOAL #1   Title  Independent with vestibular and balance HEP    Time  8    Period  Weeks    Status  New    Target Date  09/10/17      PT LONG TERM GOAL #2   Title  Pt will demonstrate negative positional vestibular testing    Baseline  R posterior canal cupulolithiasis    Time  8    Period  Weeks  Status  New    Target Date  09/10/17      PT LONG TERM GOAL #3   Title  Pt will improve use of VOR as indicated by DVA 2-3 line difference    Baseline  deferred; pt demonstrated 0 line difference when assessed.    Status  Deferred      PT LONG TERM GOAL #4   Title  Pt will improve gait velocity to >/= 3.6 ft/sec with cane for increased safety during community gait    Baseline  3.07 ft/sec    Time  8    Period  Weeks    Status  Revised    Target Date  09/10/17      PT LONG TERM GOAL #5   Title  Pt will improve FGA by 4 points; using cane to perform as needed    Baseline  16/30    Time  8    Period  Weeks    Status  Revised    Target Date  09/10/17      PT LONG  TERM GOAL #6   Title  Pt will improve overall function as indicated by FOTO to >/= 60%    Baseline  51% function    Time  8    Period  Weeks    Status  New            Plan - 07/24/17 1342    Clinical Impression Statement  Treatment session today focused on increasing safety and efficiency of gait in patient's home and in community with use of foot up brace to prevent foot drag as pt fatigues and visual fixation during sit <> stand and change in direction to decrease feelings of disequilibrium.  Pt reported significant sensitivity to the foot up brace on her lower leg due to neuropathy.  Will continue to trial to allow pt determine if she would be able to tolerate before purchasing.  Initiated vestibular and balance HEP with x 1 viewing with support and tandem gait.  Will continue to add to HEP next session.    PT Treatment/Interventions  ADLs/Self Care Home Management;Canalith Repostioning;DME Instruction;Gait training;Functional mobility training;Therapeutic activities;Therapeutic exercise;Balance training;Neuromuscular re-education;Patient/family education;Vestibular    PT Next Visit Plan  reassess R posterior canal and treat as indicated.  Foot up brace full session to determine pt tolerance; add to HEP: corner balance, progress x 1 viewing    Consulted and Agree with Plan of Care  Patient       Patient will benefit from skilled therapeutic intervention in order to improve the following deficits and impairments:  Decreased balance, Dizziness, Impaired sensation, Difficulty walking  Visit Diagnosis: Dizziness and giddiness  Unsteadiness on feet  Other abnormalities of gait and mobility     Problem List Patient Active Problem List   Diagnosis Date Noted  . Brain metastasis ([place]) 11/18/2016  . Genetic testing 07/11/2016  . ALLERGY TO CHLORHEXADINE, USE BETADINE ONLY 06/23/2016  . Kyphosis of thoracic region 06/13/2016  . Plantar fasciitis, bilateral 04/20/2016  . Neuropathic  pain 01/27/2016  . Anemia of chronic disease 05/21/2015  . Metastatic cancer to spine ([place]) 03/27/2015  . Thrombocytopenia ([place]) 03/12/2015  . Prerenal azotemia 03/12/2015  . Pain   . Recurrent UTI--due to Klebsiella   . Paraplegia at T4 level ([place]) 02/20/2015  . Neurogenic bowel 02/20/2015  . Neurogenic bladder 02/20/2015  . Postoperative anemia due to acute blood loss 02/17/2015  . Spinal cord compression due to malignant neoplasm metastatic to spine (  [place]) 02/15/2015  . Epidural mass 02/15/2015  . Thoracic spine tumor   . Pathologic fracture of vertebra 02/14/2015  . Pathologic fracture of thoracic vertebrae 02/14/2015  . Breast cancer metastasized to bone, right ([place]) 02/14/2015  . Hyperlipidemia 02/14/2015  . H. pylori infection   . HTN (hypertension) 04/17/2012  . Primary cancer of upper outer quadrant of right female breast ([place]) 03/08/2012    Rico Junker, PT, DPT 07/24/17    1:47 PM    [facility] [address] [place], [place], [address] Phone: [phone]   Fax:  [phone]  Name: Heather [name] MRN: [id number] Date of Birth: Dec 21, 1945

## 2017-07-26 ENCOUNTER — Encounter (HOSPITAL_COMMUNITY): Payer: Self-pay

## 2017-07-26 ENCOUNTER — Ambulatory Visit (HOSPITAL_COMMUNITY)
Admission: RE | Admit: 2017-07-26 | Discharge: 2017-07-26 | Disposition: A | Discharge status: Home / Self Care | Payer: Medicare Other | Source: Ambulatory Visit | Attending: Adult Health | Admitting: Adult Health

## 2017-07-26 DIAGNOSIS — R590 Localized enlarged lymph nodes: Secondary | ICD-10-CM | POA: Insufficient documentation

## 2017-07-26 DIAGNOSIS — C50411 Malignant neoplasm of upper-outer quadrant of right female breast: Secondary | ICD-10-CM | POA: Insufficient documentation

## 2017-07-26 DIAGNOSIS — C7931 Secondary malignant neoplasm of brain: Secondary | ICD-10-CM | POA: Insufficient documentation

## 2017-07-26 DIAGNOSIS — R918 Other nonspecific abnormal finding of lung field: Secondary | ICD-10-CM | POA: Insufficient documentation

## 2017-07-26 DIAGNOSIS — M899 Disorder of bone, unspecified: Secondary | ICD-10-CM | POA: Insufficient documentation

## 2017-07-26 MED ORDER — IOPAMIDOL (ISOVUE-300) INJECTION 61%
75.0000 mL | Freq: Once | INTRAVENOUS | Status: AC | PRN
  Administered 2017-07-26: 75 mL via INTRAVENOUS

## 2017-07-26 MED ORDER — HEPARIN SOD (PORK) LOCK FLUSH 100 UNIT/ML IV SOLN
500.0000 [IU] | Freq: Once | INTRAVENOUS | Status: AC
  Administered 2017-07-26: 500 [IU] via INTRAVENOUS

## 2017-07-26 MED ORDER — HEPARIN SOD (PORK) LOCK FLUSH 100 UNIT/ML IV SOLN
INTRAVENOUS | Status: AC
  Filled 2017-07-26: qty 5

## 2017-07-26 MED ORDER — IOPAMIDOL (ISOVUE-300) INJECTION 61%
INTRAVENOUS | Status: AC
  Filled 2017-07-26: qty 75

## 2017-07-27 ENCOUNTER — Telehealth: Payer: Self-pay | Admitting: Pharmacy Technician

## 2017-07-27 ENCOUNTER — Inpatient Hospital Stay: Payer: Medicare Other

## 2017-07-27 ENCOUNTER — Telehealth: Payer: Self-pay | Admitting: Hematology and Oncology

## 2017-07-27 ENCOUNTER — Inpatient Hospital Stay (HOSPITAL_BASED_OUTPATIENT_CLINIC_OR_DEPARTMENT_OTHER): Payer: Medicare Other | Admitting: Hematology and Oncology

## 2017-07-27 ENCOUNTER — Inpatient Hospital Stay: Payer: Medicare Other | Attending: Hematology and Oncology

## 2017-07-27 ENCOUNTER — Telehealth: Payer: Self-pay | Admitting: Pharmacist

## 2017-07-27 VITALS — BP 145/81 | HR 89 | Temp 98.1°F | Resp 18 | Ht 64.0 in | Wt 131.0 lb

## 2017-07-27 DIAGNOSIS — Z79899 Other long term (current) drug therapy: Secondary | ICD-10-CM | POA: Diagnosis not present

## 2017-07-27 DIAGNOSIS — C7951 Secondary malignant neoplasm of bone: Principal | ICD-10-CM

## 2017-07-27 DIAGNOSIS — Z170 Estrogen receptor positive status [ER+]: Secondary | ICD-10-CM | POA: Diagnosis not present

## 2017-07-27 DIAGNOSIS — Z9221 Personal history of antineoplastic chemotherapy: Secondary | ICD-10-CM

## 2017-07-27 DIAGNOSIS — Z923 Personal history of irradiation: Secondary | ICD-10-CM | POA: Insufficient documentation

## 2017-07-27 DIAGNOSIS — C50911 Malignant neoplasm of unspecified site of right female breast: Secondary | ICD-10-CM

## 2017-07-27 DIAGNOSIS — C50411 Malignant neoplasm of upper-outer quadrant of right female breast: Secondary | ICD-10-CM

## 2017-07-27 DIAGNOSIS — C7931 Secondary malignant neoplasm of brain: Secondary | ICD-10-CM | POA: Diagnosis not present

## 2017-07-27 DIAGNOSIS — G9529 Other cord compression: Secondary | ICD-10-CM

## 2017-07-27 LAB — CBC WITH DIFFERENTIAL/PLATELET
Basophils Absolute: 0 10*3/uL (ref 0.0–0.1)
Basophils Relative: 1 %
Eosinophils Absolute: 0 10*3/uL (ref 0.0–0.5)
Eosinophils Relative: 0 %
HCT: 38.1 % (ref 34.8–46.6)
Hemoglobin: 12.2 g/dL (ref 11.6–15.9)
Lymphocytes Relative: 5 %
Lymphs Abs: 0.3 10*3/uL — ABNORMAL LOW (ref 0.9–3.3)
MCH: 26.4 pg (ref 25.1–34.0)
MCHC: 32 g/dL (ref 31.5–36.0)
MCV: 82.5 fL (ref 79.5–101.0)
Monocytes Absolute: 0.7 10*3/uL (ref 0.1–0.9)
Monocytes Relative: 11 %
Neutro Abs: 5.2 10*3/uL (ref 1.5–6.5)
Neutrophils Relative %: 83 %
Platelets: 131 10*3/uL — ABNORMAL LOW (ref 145–400)
RBC: 4.62 MIL/uL (ref 3.70–5.45)
RDW: 15.4 % — ABNORMAL HIGH (ref 11.2–14.5)
WBC: 6.2 10*3/uL (ref 3.9–10.3)

## 2017-07-27 LAB — COMPREHENSIVE METABOLIC PANEL
ALT: 43 U/L (ref 0–55)
AST: 67 U/L — ABNORMAL HIGH (ref 5–34)
Albumin: 3.4 g/dL — ABNORMAL LOW (ref 3.5–5.0)
Alkaline Phosphatase: 90 U/L (ref 40–150)
Anion gap: 9 (ref 3–11)
BUN: 13 mg/dL (ref 7–26)
CO2: 25 mmol/L (ref 22–29)
Calcium: 11.6 mg/dL — ABNORMAL HIGH (ref 8.4–10.4)
Chloride: 99 mmol/L (ref 98–109)
Creatinine, Ser: 0.81 mg/dL (ref 0.60–1.10)
GFR calc Af Amer: >60 mL/min (ref >60)
GFR calc non Af Amer: >60 mL/min (ref >60)
Glucose, Bld: 107 mg/dL (ref 70–140)
Potassium: 3.8 mmol/L (ref 3.5–5.1)
Sodium: 133 mmol/L — ABNORMAL LOW (ref 136–145)
Total Bilirubin: 0.5 mg/dL (ref 0.2–1.2)
Total Protein: 7.4 g/dL (ref 6.4–8.3)

## 2017-07-27 MED ORDER — SODIUM CHLORIDE 0.9 % IJ SOLN
10.0000 mL | INTRAMUSCULAR | Status: DC | PRN
  Administered 2017-07-27: 10 mL via INTRAVENOUS
  Filled 2017-07-27: qty 10

## 2017-07-27 MED ORDER — PALBOCICLIB 125 MG PO CAPS: 125.0000 mg | ORAL_CAPSULE | Freq: Every day | ORAL | 3 refills | Status: DC

## 2017-07-27 MED ORDER — HEPARIN SOD (PORK) LOCK FLUSH 100 UNIT/ML IV SOLN
500.0000 [IU] | Freq: Once | INTRAVENOUS | Status: AC | PRN
  Administered 2017-07-27: 500 [IU] via INTRAVENOUS
  Filled 2017-07-27: qty 5

## 2017-07-27 MED ORDER — SODIUM CHLORIDE 0.9 % IJ SOLN
10.0000 mL | INTRAMUSCULAR | Status: DC | PRN
  Filled 2017-07-27: qty 10

## 2017-07-27 MED ORDER — FULVESTRANT 250 MG/5ML IM SOLN
500.0000 mg | Freq: Once | INTRAMUSCULAR | Status: AC
  Administered 2017-07-27: 500 mg via INTRAMUSCULAR

## 2017-07-27 NOTE — Telephone Encounter (Signed)
Oral Oncology Patient Advocate Encounter  Received notification from [facility] that prior authorization for [facility] is required.  PA submitted on CoverMyMeds Key [id number] Status is pending  Oral Oncology Clinic will continue to follow.  Fabio Asa. Melynda Keller, [place] Patient [place] [phone] 07/27/2017 11:14 AM

## 2017-07-27 NOTE — Assessment & Plan Note (Signed)
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 MRI 02/14/2015: T5 large destructive lesion with pathologic compression fracture and extensive epidural tumor, involvement of T4, excision metastatic carcinoma ER 60%, PR 0%, HER-2 positive ratio 2.71 average copy #7.45 03/27/2015: T4-6 decompression surgery for spinal cord compression PET-CT 12/10/17Rt para-spinal mass mildy hypermetabolic SUV 5.9, lytic cortical lesion 4.8 cm lesion left prox femur suv 8.8 favor osseus met disease Echo 04/12/2017 shows well preserved ejection fraction of 55-60%   Treatment summary: 1. Resumed anastrozole 05/21/2015, but it has caused significant hair loss so we switched her to exemestane 25 mg once daily 07/23/2015 2. Herceptin every 3 week started 04/10/2015 3. Bone metastases: Zometa every 3 months-next due in March, 2019 4. Radiation therapy to the spine (Dr. [name]) 04/13/2015 to 05/19/2015 5. Radiation therapy to left femur 04/28/2016 to 05/12/2016  6. SRS to the brain lesion 11/24/2016 -------------------------------------------------------------------------------------------------------------------------- Goal of treatment: Palliation Treatment plan:Kadcyla 05/05/2017 every 3 weeks, today's cycle 4 Chemo toxicities: She had burning of her feet after last cycle, Kadcyla further dose reduced and has improved Knee pain: xrays of both knees after kadcyla-normal  Bone metastases:Zometaevery 3 months with calcium and vitamin D (due again in March)  CT chest 07/26/2017: Interval increase in the size and number of multiple enhancing masses of soft tissue posteriorly overlying the spine, interval development of left axillary and mediastinal adenopathy, interval increase in the lytic  lesions T8 and T9 increasing lytic lesion left ninth rib, ill-defined groundglass opacity RLL  Radiology review: I discussed with the patient that there is clear evidence of progression of disease.  Because of this I recommended discontinuation of Kadcyla.  Recommendation: I discussed multiple treatment options including Xeloda with lapatinib versus Palbociclib with  Faslodex +/-trastuzumab

## 2017-07-27 NOTE — Progress Notes (Signed)
Patient Care Team: [name], MD as PCP - General (Family Medicine)  DIAGNOSIS:  Encounter Diagnoses  Name Primary?  . Metastatic cancer to spine ([place]) Yes  . Breast cancer metastasized to bone, right ([place])   . Brain metastasis ([place])   . Primary cancer of upper outer quadrant of right female breast ([place])     SUMMARY OF ONCOLOGIC HISTORY:   Primary cancer of upper outer quadrant of right female breast ([place])   03/08/2012 Initial Diagnosis    Right breast invasive ductal carcinoma ER positive PR negative HER-2 positive Ki-67 44%; another breast mass biopsied in the anterior part of the breast which was also positive for malignancy that was HER-2 negative      05/10/2012 Surgery    Right mastectomy and axillary lymph node dissection: Multifocal disease 5 cm, 1.7 cm, 1.6 cm, ER positive PR negative HER-2 positive Ki-67 44%, 3/7 lymph nodes positive      06/14/2012 - 06/12/2013 Chemotherapy    Adjuvant chemotherapy with [place] 6 followed by Herceptin maintenance      10/25/2012 - 12/11/2012 Radiation Therapy    Adjuvant radiation therapy      02/07/2013 -  Anti-estrogen oral therapy    Letrozole 2.5 mg daily      02/14/2015 Imaging    MRI spine: Large destructive T5 lesion with severe pathologic compression fracture and extensive epidural tumor, severe spinal stenosis with moderate cord compression, tumor involvement of T4      03/18/2015 PET scan    Residual enhancing soft tissue adjacent to the spinal cord. No evidence of metastatic disease. Nonspecific uptake the left nipple      03/27/2015 - 03/30/2015 Hospital Admission    T4-6 decompression for spinal cord compression (lower extremity paralysis)      04/10/2015 -  Chemotherapy    Palliative treatment with Herceptin every 3 weeks with letrozole 2.5 mg daily      04/13/2015 - 05/19/2015 Radiation Therapy    Palliative radiation treatment to the spine      09/01/2015 Imaging    CT chest abdomen pelvis: Pathologic fracture  with posterior fusion at T5 no other evidence of metastatic disease in the chest abdomen pelvis      12/23/2015 Imaging    CT CAP: new nonspecific 0.6 cm lymph node was stated mediastinum needs follow-up CT, innumerable tiny groundglass pulmonary nodules throughout both lungs unchanged, patchy consolidation from radiation, T5 fracture, no mets      04/15/2016 PET scan    Rt para-spinal mass mildy hypermetabolic SUV 5.9, lytic cortical lesion 4.8 cm lesion left prox femur suv 8.8 favor osseus met disease      04/28/2016 - 05/12/2016 Radiation Therapy    Palliative XRT Left femur      05/24/2016 Procedure    Soft tissue mass biopsy back: Metastatic breast cancer ER 80%, PR 0%, Ki-67 50%, HER-2 positive ratio 2.25      06/09/2016 - 03/29/2017 Anti-estrogen oral therapy    Faslodex with Herceptin and Perjeta every 4 weeks      06/13/2016 - 06/15/2016 Hospital Admission    uncomplicated revision of previous thoracic instrumentation.      10/10/2016 PET scan    Interval development of new hypermetabolic 2.5 x 1.1 cm lesion posterior right eighth rib consistent with metastatic disease, stable right paraspinal lesion at T8, clustered soft tissue nodules in the posterior midline back near cervicothoracic junction smaller than before       11/03/2016 - 11/07/2016 Radiation Therapy  Palliative radiation to right eighth rib      11/04/2016 Imaging    Solitary contrast-enhancing lesion in the right cerebellum 6 x 2 mm concerning for metastatic lesion      11/24/2016 - 11/24/2016 Radiation Therapy    SRS to the brain lesion      04/18/2017 PET scan    New ill-defined rounded airspace opacity in posterior right lower lobe. Differential diagnosis includes malignancy and infectious/inflammatory process.Increased hypermetabolic lytic lesion in T8 vertebral body, consistent with bone metastasis.  Increased hypermetabolic posterior upper chest wall subcutaneous nodules, highly suspicious metastatic  disease. Further improvement in right posterior eighth rib metastasis.      05/04/2017 -  Chemotherapy    Kadcyla every 3 weeks palliative chemotherapy      07/26/2017 Imaging    Interval increase in size of the multiple enhancing masses posteriorly overlying the spine, interval development of left axillary and mediastinal adenopathy, increase in the size of lytic lesions in T8-T9 and left ninth rib, increased groundglass opacities right lower lobe lung      08/09/2017 -  Chemotherapy    The patient had trastuzumab (HERCEPTIN) 357 mg in sodium chloride 0.9 % 250 mL chemo infusion, 6 mg/kg, Intravenous,  Once, 0 of 11 cycles pertuzumab (PERJETA) 420 mg in sodium chloride 0.9 % 250 mL chemo infusion, 420 mg, Intravenous, Once, 0 of 11 cycles  for chemotherapy treatment.        CHIEF COMPLIANT: Follow-up to discuss recent CT scans  INTERVAL HISTORY: Heather [name] is a 72 year old with above-mentioned history of metastatic breast cancer who underwent recent CT scan and is here today to discuss results.  She is currently on Kadcyla and appears to have had neuropathy related to it.  Other than that she was tolerating it fairly well.  She has noticed that the subcutaneous nodules in the back appear to have gotten larger.  She is here today to discuss the results of the scan and to discuss the future treatment plan.  Because of previous cord compression she has had some difficulty with gait.  She also had some fatigue as result of Kadcyla  REVIEW OF SYSTEMS:   Constitutional: Denies fevers, chills or abnormal weight loss Eyes: Denies blurriness of vision Ears, nose, mouth, throat, and face: Denies mucositis or sore throat Respiratory: Denies cough, dyspnea or wheezes Cardiovascular: Denies palpitation, chest discomfort Gastrointestinal:  Denies nausea, heartburn or change in bowel habits Skin: Worsening tumors in the back Lymphatics: Denies new lymphadenopathy or easy bruising Neurological:  Previous cord compression with gait abnormality Behavioral/Psych: Mood is stable, no new changes  Extremities: No lower extremity edema  All other systems were reviewed with the patient and are negative.  I have reviewed the past medical history, past surgical history, social history and family history with the patient and they are unchanged from previous note.  ALLERGIES:  is allergic to other; acyclovir and related; zanaflex [tizanidine hcl]; codeine; keflex [cephalexin]; naprosyn [naproxen]; oxycodone; tramadol; and tussin [guaifenesin].  MEDICATIONS:  Current Outpatient Medications  Medication Sig Dispense Refill  . ALPRAZolam (XANAX) 0.25 MG tablet Take 1 tablet by mouth as needed.    Marland Kitchen b complex vitamins tablet Take 1 tablet by mouth daily.    . Calcium Carbonate Antacid (TUMS PO) Take 2 tablets by mouth 2 (two) times daily as needed (acid reflux).    . Cholecalciferol (VITAMIN D) 2000 UNITS tablet Take 2,000 Units by mouth daily.    . Coenzyme Q10 (COQ10) 100 MG CAPS  Take 100 mg by mouth as needed. Days that she takes pravastatin MWF.    Marland Kitchen fluticasone (FLONASE) 50 MCG/ACT nasal spray Place 1 spray into both nostrils daily as needed.    . gabapentin (NEURONTIN) 600 MG tablet Take 1 tablet (600 mg total) by mouth as directed. Take 1 tablet in the AM and afternoon and 1 1/2 tabs at bedtime (Patient taking differently: Take 600 mg by mouth 3 (three) times daily. Take 1 tablet in the AM and afternoon and 1 1/2 tabs at bedtime) 315 tablet 2  . HYDROcodone-acetaminophen (NORCO/VICODIN) 5-325 MG tablet     . lidocaine-prilocaine (EMLA) cream Apply to affected area once (Patient taking differently: Apply 1 application topically every 30 (thirty) days. Apply to port prior to infusions) 30 g 3  . OVER THE COUNTER MEDICATION Place 1 drop into both eyes 2 (two) times daily as needed (dry eyes). Over the counter lubricating eye drops    . palbociclib (IBRANCE) 125 MG capsule Take 1 capsule (125 mg  total) by mouth daily with breakfast. Take for 21 days on, 7 days off, repeat every 28 days. Take whole with food. 21 capsule 3  . Probiotic Product (PROBIOTIC DAILY PO) Take 1 capsule by mouth daily.     . ranitidine (ZANTAC) 300 MG tablet Take 300 mg by mouth at bedtime. Reported on 06/25/2015    . spironolactone (ALDACTONE) 25 MG tablet Take 25 mg by mouth daily.     Current Facility-Administered Medications  Medication Dose Route Frequency Provider Last Rate Last Dose  . sodium chloride 0.9 % injection 10 mL  10 mL Intravenous PRN [name], MD   10 mL at 07/27/17 1031   Facility-Administered Medications Ordered in Other Visits  Medication Dose Route Frequency Provider Last Rate Last Dose  . sodium chloride 0.9 % injection 10 mL  10 mL Intravenous PRN [name], MD   10 mL at 04/26/17 4540    PHYSICAL EXAMINATION: ECOG PERFORMANCE STATUS: 1 - Symptomatic but completely ambulatory  Vitals:   07/27/17 0905  BP: (!) 145/81  Pulse: 89  Resp: 18  Temp: 98.1 F (36.7 C)  SpO2: 98%   Filed Weights   07/27/17 0905  Weight: 131 lb (59.4 kg)    GENERAL:alert, no distress and comfortable SKIN: Large palpable nodules on the back related to metastatic breast cancer EYES: normal, Conjunctiva are pink and non-injected, sclera clear OROPHARYNX:no exudate, no erythema and lips, buccal mucosa, and tongue normal  NECK: supple, thyroid normal size, non-tender, without nodularity LYMPH:  no palpable lymphadenopathy in the cervical, axillary or inguinal LUNGS: clear to auscultation and percussion with normal breathing effort HEART: regular rate & rhythm and no murmurs and no lower extremity edema ABDOMEN:abdomen soft, non-tender and normal bowel sounds MUSCULOSKELETAL:no cyanosis of digits and no clubbing  NEURO: alert & oriented x 3 with fluent speech, no focal motor/sensory deficits EXTREMITIES: No lower extremity edema  LABORATORY DATA:  I have reviewed the data as listed CMP  Latest Ref Rng & Units 07/27/2017 07/06/2017 06/16/2017  Glucose 70 - 140 mg/dL 107 71 83  BUN 7 - 26 mg/dL '13 11 10  ' Creatinine 0.60 - 1.10 mg/dL 0.81 0.82 0.76  Sodium 136 - 145 mmol/L 133(L) 139 136  Potassium 3.5 - 5.1 mmol/L 3.8 3.8 4.0  Chloride 98 - 109 mmol/L 99 102 100  CO2 22 - 29 mmol/L '25 29 27  ' Calcium 8.4 - 10.4 mg/dL 11.6(H) 12.8(H) 10.3  Total Protein 6.4 - 8.3 g/dL  7.4 7.5 7.4  Total Bilirubin 0.2 - 1.2 mg/dL 0.5 0.5 0.4  Alkaline Phos 40 - 150 U/L 90 88 91  AST 5 - 34 U/L 67(H) 72(H) 34  ALT 0 - 55 U/L 43 61(H) 25    Lab Results  Component Value Date   WBC 6.2 07/27/2017   HGB 12.2 07/27/2017   HCT 38.1 07/27/2017   MCV 82.5 07/27/2017   PLT 131 (L) 07/27/2017   NEUTROABS 5.2 07/27/2017    ASSESSMENT & PLAN:  Primary cancer of upper outer quadrant of right female breast ([place]) Right breast invasive ductal carcinoma ER positive PR negative HER-2 positive Ki-67 44% multifocal disease 3/7 lymph nodes positive T2 N1 M0 stage IIB status post adjuvant chemotherapy with TCH followed by Herceptin maintenance, adjuvant radiation therapy and Letrozole. MRI 02/14/2015: T5 large destructive lesion with pathologic compression fracture and extensive epidural tumor, involvement of T4, excision metastatic carcinoma ER 60%, PR 0%, HER-2 positive ratio 2.71 average copy #7.45 03/27/2015: T4-6 decompression surgery for spinal cord compression PET-CT 12/10/17Rt para-spinal mass mildy hypermetabolic SUV 5.9, lytic cortical lesion 4.8 cm lesion left prox femur suv 8.8 favor osseus met disease Echo 04/12/2017 shows well preserved ejection fraction of 55-60%   Treatment summary: 1. Resumed anastrozole 05/21/2015, but it has caused significant hair loss so we switched her to exemestane 25 mg once daily 07/23/2015 2. Herceptin every 3 week started 04/10/2015 3. Bone metastases: Zometa every 3 months-next due in March, 2019 4. Radiation therapy to the spine (Dr. [name]) 04/13/2015 to  05/19/2015 5. Radiation therapy to left femur 04/28/2016 to 05/12/2016  6. SRS to the brain lesion 11/24/2016 -------------------------------------------------------------------------------------------------------------------------- Goal of treatment: Palliation Treatment plan:Kadcyla 05/05/2017 every 3 weeks, today's cycle 4 Chemo toxicities: She had burning of her feet after last cycle, Kadcyla further dose reduced and has improved Knee pain: xrays of both knees after kadcyla-normal  Bone metastases:Zometaevery 3 months with calcium and vitamin D (due again in March)  CT chest 07/26/2017: Interval increase in the size and number of multiple enhancing masses of soft tissue posteriorly overlying the spine, interval development of left axillary and mediastinal adenopathy, interval increase in the lytic lesions T8 and T9 increasing lytic lesion left ninth rib, ill-defined groundglass opacity RLL  Radiology review: I discussed with the patient that there is clear evidence of progression of disease.  Because of this I recommended discontinuation of Kadcyla.  Recommendation: I discussed multiple treatment options and recommended Palbociclib + Faslodex + trastuzumab + Pertuzumab Palbociclib counseling: I discussed at length the risks and benefits of Palbociclib in combination with Faslodex. Adverse effects of Palbociclib include decreasing neutrophil count, pneumonia, blood clots in lungs as well as nausea and GI symptoms and fatigue.   We will discontinue Kadcyla. Return to clinic in 2 weeks for toxicity check and blood count evaluation Just see her pharmacist was able to provide her first month of palbociclib.  I spent 45 minutes talking to the patient of which more than half was spent in counseling and coordination of care.  Orders Placed This Encounter  Procedures  . SCHEDULING COMMUNICATION INJECTION    Schedule 1 hour injection appointment   The patient has a good understanding of  the overall plan. she agrees with it. she will call with any problems that may develop before the next visit here.   Harriette Ohara, MD 07/27/17

## 2017-07-27 NOTE — Progress Notes (Signed)
DISCONTINUE ON PATHWAY REGIMEN - Breast     A cycle is every 21 days:     Ado-trastuzumab emtansine   **Always confirm dose/schedule in your pharmacy ordering system**    REASON: Disease Progression PRIOR TREATMENT: [id number]: Ado-Trastuzumab Emtansine q21 Days Until Progression or Toxicity TREATMENT RESPONSE: Progressive Disease (PD)  START OFF PATHWAY REGIMEN - Breast   [id number]:Pertuzumab 420 mg + Trastuzumab 6 mg/kg q21 Days:   A cycle is every 21 days:     Trastuzumab      Pertuzumab   **Always confirm dose/schedule in your pharmacy ordering system**    Patient Characteristics: Distant Metastases or Locoregional Recurrent Disease - Unresected, HER2 Positive, ER Positive, HER2-Targeted Therapy (Concurrent with Endocrine Therapy) Therapeutic Status: Distant Metastases BRCA Mutation Status: Absent ER Status: Positive (+) HER2 Status: Positive (+) Would you be surprised if this patient died  in the next year<= I would be surprised if this patient died in the next year PR Status: Negative (-) Intent of Therapy: Non-Curative / Palliative Intent, Discussed with Patient

## 2017-07-27 NOTE — Telephone Encounter (Signed)
Gave patient AVs and calendar of upcoming march through June appointments.

## 2017-07-27 NOTE — Telephone Encounter (Signed)
Oral Chemotherapy Pharmacist Encounter   I spoke with patient and husband in exam room for overview of: Ibrance.   Counseled patient on administration, dosing, side effects, monitoring, drug-food interactions, safe handling, storage, and disposal.  Patient will take Ibrance 125 mg capsules, 1 capsule by mouth once daily with breakfast, for 3 weeks on, 1 week off.  Patient knows to avoid grapefruit and grapefruit juice.  Ibrance start date: 07/27/2017  Side effects include but not limited to: fatigue, hair loss, GI upset, nausea, decreased blood counts, and increased upper respiratory infections.  Reviewed with patient importance of keeping a medication schedule and plan for any missed doses.  Mrs. Kaeser voiced understanding and appreciation.   All questions answered. Medication reconciliation performed and medication/allergy list updated.  Will follow up with patient regarding insurance and pharmacy.   Patient provided samples in the clinic today to prevent delay in start of treatment.  Medication: Ibrance 125 mg capsules Instructions: Take 1 capsule by mouth once daily with breakfast.  Take for 21 days on 7 days off, repeat every 28 days. Quantity dispensed: 21 Days supply: 28 Manufacturer: Pfizer Lot: [id number] Exp: 08/2018   Patient knows to call the office with questions or concerns. Oral Oncology Clinic will continue to follow.  Thank you,  Johny Drilling, PharmD, BCPS, BCOP 07/27/2017   11:19 AM Oral Oncology Clinic [phone]

## 2017-07-27 NOTE — Telephone Encounter (Signed)
Oral Oncology Pharmacist Encounter  Received new prescription for Ibrance (palbociclib) for the treatment of metastatic, hormone receptor positive breast cancer in conjunction with Faslodex, planned duration until disease progression or unacceptable toxicity.  Labs from 07/27/2017 assessed, OK for treatment. Noted pltc=131, tPatient his will continue to be monitored  Current medication list in Epic reviewed, no significant DDIs with Ibrance identified.  Prescription has been e-scribed to the [facility] for benefits analysis and approval. Prior authorization is required and has been submitted.  Oral Oncology Clinic will continue to follow for insurance authorization, copayment issues, initial counseling and start date.  Johny Drilling, PharmD, BCPS, BCOP 07/27/2017 9:46 AM Oral Oncology Clinic [phone]

## 2017-07-31 ENCOUNTER — Encounter: Payer: Self-pay | Admitting: Physical Therapy

## 2017-07-31 ENCOUNTER — Ambulatory Visit: Payer: Medicare Other | Admitting: Physical Therapy

## 2017-07-31 DIAGNOSIS — R42 Dizziness and giddiness: Secondary | ICD-10-CM

## 2017-07-31 DIAGNOSIS — R2689 Other abnormalities of gait and mobility: Secondary | ICD-10-CM | POA: Diagnosis not present

## 2017-07-31 DIAGNOSIS — R2681 Unsteadiness on feet: Secondary | ICD-10-CM | POA: Diagnosis not present

## 2017-07-31 DIAGNOSIS — H8111 Benign paroxysmal vertigo, right ear: Secondary | ICD-10-CM

## 2017-07-31 NOTE — Therapy (Signed)
[facility] [address] [place], [place], [address] Phone: [phone]   Fax:  [phone]  Physical Therapy Treatment  Patient Details  Name: Heather [name] MRN: [id number] Date of Birth: 09/26/1945 Referring Provider: [name], MD   Encounter Date: 07/31/2017  PT End of Session - 07/31/17 1033    Visit Number  6    Number of Visits  17    Date for PT Re-Evaluation  09/10/17    Authorization Type  Medicare, BCBS    PT Start Time  [phone]    PT Stop Time  1020    PT Time Calculation (min)  44 min    Activity Tolerance  Patient limited by pain    Behavior During Therapy  [facility] for tasks assessed/performed       Past Medical History:  Diagnosis Date  . Anemia    hx of  . Anxiety    r/t updated surgery  . Breast cancer ([place])    right  . Cataract    immature-not sure of which eye  . Complication of anesthesia    pt states she is sensitive to meds  . Dizziness    has been going on for 55month;medical MD aware  . Genetic testing 07/11/2016   Test Results: Normal; No pathogenic mutations detected  Genes Analyzed: 43 genes on [employer]'s Common Cancers panel (APC, ATM, AXIN2, BARD1, BMPR1A, BRCA1, BRCA2, BRIP1, CDH1, CDKN2A, CHEK2, DICER1, EPCAM, GREM1, HOXB13, KIT, MEN1, MLH1, MSH2, MSH6, MUTYH, NBN, NF1, PALB2, PDGFRA, PMS2, POLD1, POLE, PTEN, RAD50, RAD51C, RAD51D, SDHA, SDHB, SDHC, SDHD, SMAD4, SMARCA4, STK11, TP53, TSC1, TSC2, VHL).  .Marland KitchenGERD (gastroesophageal reflux disease)    Tums prn  . H. pylori infection   . H/O hiatal hernia   . Hemorrhoids   . History of UTI   . Hx of radiation therapy 10/25/12- 12/11/12   right chest wall/regional lymph nodes 5040 cGy, 28 sessions, right chest wall boost 1000 cGy 1 session  . Hx: UTI (urinary tract infection)   . Hyperlipidemia    but not on meds;diet and exercise controlled  . Hypertension    recently started Aldactone   . Insomnia    takes Melatoniin daily  .  Metastasis to spinal column (HCC)    T5, Left  Femur cancer- radiation   . Neuropathy    "from back surgery"  . PONV (postoperative nausea and vomiting)    pt experienced hair loss, confusion and combative- 02/15/15  . Right knee pain   . Right shoulder pain   . Skin cancer    squamous, Nodule to back - "same cancer as breast."  . Urinary frequency    d/t taking Aldactone    Past Surgical History:  Procedure Laterality Date  . COLONOSCOPY    . cyst removed from left breast  1970  . DECOMPRESSIVE LUMBAR LAMINECTOMY LEVEL 4 N/A 02/15/2015   Procedure: DECOMPRESSION T5 AND T3-T7 STABALIZATION;  Surgeon: NConsuella Lose MD;  [facility];  Service: Neurosurgery;  Laterality: N/A;  . ESOPHAGOGASTRODUODENOSCOPY    . LAMINECTOMY N/A 03/27/2015   Procedure: Thoracic Four-Thoracic Six Laminectomy for tumor;  Surgeon: NConsuella Lose MD;  [facility];  Service: Neurosurgery;  Laterality: N/A;  T4-T6 Laminectomy  . left wrist surgery   2004   with plate  . MASTECTOMY MODIFIED RADICAL  05/10/2012   Procedure: MASTECTOMY MODIFIED RADICAL;  Surgeon: PMerrie Roof MD;  [facility]  Service: General;  Laterality:  Right;  RIGHT MODIFIED RADICAL MASTECTOMY  . MASTECTOMY, RADICAL Right   . MINOR BREAST BIOPSY Left 05/16/2016   Procedure: EXCISION OF BACK NODULE;  Surgeon: Autumn Messing III, MD;  Location: [facility];  Service: General;  Laterality: Left;  . Nodule  back  05/17/2015  . PORTACATH PLACEMENT  05/10/2012   Procedure: INSERTION PORT-A-CATH;  Surgeon: Merrie Roof, MD;  Location: [facility];  Service: General;  Laterality: Left;    There were no vitals filed for this visit.  Subjective Assessment - 07/31/17 0940    Subjective  Pt had f/u with oncologist, "It wasn't a good visit".  Oncologist reports that lesions on T-spine have increased in size and there are new thoracic spine and rib lesions.  Oncologist made changes to pt medication. Pt feeling more  dizzy and emotional today.  Spinning started when reclined and this morning while in the bed.    Pertinent History  metastatic breast CA with mets to cerebellum, ribs, spine; T4 paraplegia due to metastatic lesions, neuropathy, HTN, osteopenia, CKD, OA    Patient Stated Goals  Get rid of the dizziness "if that is realistic"    Currently in Pain?  Yes    Pain Score  4     Pain Location  Foot    Pain Orientation  Left    Pain Descriptors / Indicators  Burning             Vestibular Assessment - 07/31/17 0953      Dix-Hallpike Right   Dix-Hallpike Right Duration  latent onset (>30 sec); >1 minute duration for nystagmus and symptoms    Dix-Hallpike Right Symptoms  Upbeat, right rotatory nystagmus      Dix-Hallpike Left   Dix-Hallpike Left Duration  0    Dix-Hallpike Left Symptoms  No nystagmus        No data recorded        Vestibular Treatment/Exercise - 07/31/17 1001      Vestibular Treatment/Exercise   Vestibular Treatment Provided  Canalith Repositioning    Canalith Repositioning  Epley Manuever Right;Comment with vibration and repetitve head turns       EPLEY MANUEVER RIGHT   Number of Reps   2    Overall Response  Improved Symptoms    Response Details   after use of vibration and head turns converted to canalithiasis; second treatment pt presented with no latency and decreased duration            PT Education - 07/31/17 1013    Education provided  Yes    Education Details  dermatomes relating to tingling in fingers; safe way to perform tandem gait, adjustments for x 1 viewing    Person(s) Educated  Patient    Methods  Explanation    Comprehension  Verbalized understanding       PT Short Term Goals - 07/21/17 0858      PT SHORT TERM GOAL #1   Title  Pt will tolerate repositioning maneuvers for R posterior canal cupulolithiasis    Time  4    Period  Weeks    Status  Achieved      PT SHORT TERM GOAL #2   Title  Pt will participate in further  gait and balance testing: gait velocity and FGA    Time  4    Period  Weeks    Status  Achieved      PT SHORT TERM GOAL #3   Title  Pt  will initiate gaze adaptation and balance HEP with supervision    Time  4    Period  Weeks    Status  On-going    Target Date  08/11/17        PT Long Term Goals - 07/21/17 0858      PT LONG TERM GOAL #1   Title  Independent with vestibular and balance HEP    Time  8    Period  Weeks    Status  New    Target Date  09/10/17      PT LONG TERM GOAL #2   Title  Pt will demonstrate negative positional vestibular testing    Baseline  R posterior canal cupulolithiasis    Time  8    Period  Weeks    Status  New    Target Date  09/10/17      PT LONG TERM GOAL #3   Title  Pt will improve use of VOR as indicated by DVA 2-3 line difference    Baseline  deferred; pt demonstrated 0 line difference when assessed.    Status  Deferred      PT LONG TERM GOAL #4   Title  Pt will improve gait velocity to >/= 3.6 ft/sec with cane for increased safety during community gait    Baseline  3.07 ft/sec    Time  8    Period  Weeks    Status  Revised    Target Date  09/10/17      PT LONG TERM GOAL #5   Title  Pt will improve FGA by 4 points; using cane to perform as needed    Baseline  16/30    Time  8    Period  Weeks    Status  Revised    Target Date  09/10/17      PT LONG TERM GOAL #6   Title  Pt will improve overall function as indicated by FOTO to >/= 60%    Baseline  51% function    Time  8    Period  Weeks    Status  New            Plan - 07/31/17 1034    Clinical Impression Statement  Pt deferred use of foot up brace today due to increased burning neuropathic pain today.  Pt reporting new onset of vertigo over the weekend; performed re-assessment of peripheral canals with return of R posterior canal cupulolithiasis treated x 2 with vibration and CRM (liberatory manuever not performed today due to new lesions on spine and ribs) with  conversion to canalithiasis and improvement in symptoms.  Will continue to review HEP, balance training and use of foot up brace at next session if pt is able to tolerate.    PT Treatment/Interventions  ADLs/Self Care Home Management;Canalith Repostioning;DME Instruction;Gait training;Functional mobility training;Therapeutic activities;Therapeutic exercise;Balance training;Neuromuscular re-education;Patient/family education;Vestibular    PT Next Visit Plan  reassess R posterior canal and treat as indicated.  Foot up brace full session to determine pt tolerance; add to HEP: corner balance, progress x 1 viewing    Consulted and Agree with Plan of Care  Patient       Patient will benefit from skilled therapeutic intervention in order to improve the following deficits and impairments:  Decreased balance, Dizziness, Impaired sensation, Difficulty walking  Visit Diagnosis: Dizziness and giddiness  Unsteadiness on feet  BPPV (benign paroxysmal positional vertigo), right     Problem List Patient Active Problem  List   Diagnosis Date Noted  . Brain metastasis ([place]) 11/18/2016  . Genetic testing 07/11/2016  . ALLERGY TO CHLORHEXADINE, USE BETADINE ONLY 06/23/2016  . Kyphosis of thoracic region 06/13/2016  . Plantar fasciitis, bilateral 04/20/2016  . Neuropathic pain 01/27/2016  . Anemia of chronic disease 05/21/2015  . Metastatic cancer to spine ([place]) 03/27/2015  . Thrombocytopenia ([place]) 03/12/2015  . Prerenal azotemia 03/12/2015  . Pain   . Recurrent UTI--due to Klebsiella   . Paraplegia at T4 level ([place]) 02/20/2015  . Neurogenic bowel 02/20/2015  . Neurogenic bladder 02/20/2015  . Postoperative anemia due to acute blood loss 02/17/2015  . Spinal cord compression due to malignant neoplasm metastatic to spine ([place]) 02/15/2015  . Epidural mass 02/15/2015  . Thoracic spine tumor   . Pathologic fracture of vertebra 02/14/2015  . Pathologic fracture of thoracic vertebrae 02/14/2015  .  Breast cancer metastasized to bone, right ([place]) 02/14/2015  . Hyperlipidemia 02/14/2015  . H. pylori infection   . HTN (hypertension) 04/17/2012  . Primary cancer of upper outer quadrant of right female breast ([place]) 03/08/2012    Rico Junker, PT, DPT 07/31/17    10:39 AM    [facility] [address] [place], [place], [address] Phone: [phone]   Fax:  [phone]  Name: KIMM SIDER MRN: [id number] Date of Birth: February 22, 1946

## 2017-08-02 ENCOUNTER — Ambulatory Visit: Payer: Medicare Other | Admitting: Physical Therapy

## 2017-08-03 ENCOUNTER — Ambulatory Visit: Payer: Medicare Other | Admitting: Physical Therapy

## 2017-08-03 DIAGNOSIS — R2681 Unsteadiness on feet: Secondary | ICD-10-CM | POA: Diagnosis not present

## 2017-08-03 DIAGNOSIS — H8111 Benign paroxysmal vertigo, right ear: Secondary | ICD-10-CM | POA: Diagnosis not present

## 2017-08-03 DIAGNOSIS — R2689 Other abnormalities of gait and mobility: Secondary | ICD-10-CM | POA: Diagnosis not present

## 2017-08-03 DIAGNOSIS — R42 Dizziness and giddiness: Secondary | ICD-10-CM | POA: Diagnosis not present

## 2017-08-04 ENCOUNTER — Encounter: Payer: Self-pay | Admitting: Physical Therapy

## 2017-08-04 NOTE — Therapy (Signed)
[facility] [address] [place], [place], [address] Phone: [phone]   Fax:  [phone]  Physical Therapy Treatment  Patient Details  Name: Heather [name] MRN: [id number] Date of Birth: 27-Apr-1946 Referring Provider: [name], MD   Encounter Date: 08/03/2017  PT End of Session - 08/04/17 1031    Visit Number  7    Number of Visits  17    Date for PT Re-Evaluation  09/10/17    Authorization Type  Medicare, BCBS    PT Start Time  0932    PT Stop Time  1015    PT Time Calculation (min)  43 min       Past Medical History:  Diagnosis Date  . Anemia    hx of  . Anxiety    r/t updated surgery  . Breast cancer ([place])    right  . Cataract    immature-not sure of which eye  . Complication of anesthesia    pt states she is sensitive to meds  . Dizziness    has been going on for 23month;medical MD aware  . Genetic testing 07/11/2016   Test Results: Normal; No pathogenic mutations detected  Genes Analyzed: 43 genes on [employer]'s Common Cancers panel (APC, ATM, AXIN2, BARD1, BMPR1A, BRCA1, BRCA2, BRIP1, CDH1, CDKN2A, CHEK2, DICER1, EPCAM, GREM1, HOXB13, KIT, MEN1, MLH1, MSH2, MSH6, MUTYH, NBN, NF1, PALB2, PDGFRA, PMS2, POLD1, POLE, PTEN, RAD50, RAD51C, RAD51D, SDHA, SDHB, SDHC, SDHD, SMAD4, SMARCA4, STK11, TP53, TSC1, TSC2, VHL).  .Marland KitchenGERD (gastroesophageal reflux disease)    Tums prn  . H. pylori infection   . H/O hiatal hernia   . Hemorrhoids   . History of UTI   . Hx of radiation therapy 10/25/12- 12/11/12   right chest wall/regional lymph nodes 5040 cGy, 28 sessions, right chest wall boost 1000 cGy 1 session  . Hx: UTI (urinary tract infection)   . Hyperlipidemia    but not on meds;diet and exercise controlled  . Hypertension    recently started Aldactone   . Insomnia    takes Melatoniin daily  . Metastasis to spinal column (HCC)    T5, Left  Femur cancer- radiation   . Neuropathy    "from back surgery"  .  PONV (postoperative nausea and vomiting)    pt experienced hair loss, confusion and combative- 02/15/15  . Right knee pain   . Right shoulder pain   . Skin cancer    squamous, Nodule to back - "same cancer as breast."  . Urinary frequency    d/t taking Aldactone    Past Surgical History:  Procedure Laterality Date  . COLONOSCOPY    . cyst removed from left breast  1970  . DECOMPRESSIVE LUMBAR LAMINECTOMY LEVEL 4 N/A 02/15/2015   Procedure: DECOMPRESSION T5 AND T3-T7 STABALIZATION;  Surgeon: NConsuella Lose MD;  [facility];  Service: Neurosurgery;  Laterality: N/A;  . ESOPHAGOGASTRODUODENOSCOPY    . LAMINECTOMY N/A 03/27/2015   Procedure: Thoracic Four-Thoracic Six Laminectomy for tumor;  Surgeon: NConsuella Lose MD;  [facility];  Service: Neurosurgery;  Laterality: N/A;  T4-T6 Laminectomy  . left wrist surgery   2004   with plate  . MASTECTOMY MODIFIED RADICAL  05/10/2012   Procedure: MASTECTOMY MODIFIED RADICAL;  Surgeon: PMerrie Roof MD;  [facility]  Service: General;  Laterality: Right;  RIGHT MODIFIED RADICAL MASTECTOMY  . MASTECTOMY, RADICAL Right   . MINOR BREAST BIOPSY Left 05/16/2016  Procedure: EXCISION OF BACK NODULE;  Surgeon: Autumn Messing III, MD;  Location: [facility];  Service: General;  Laterality: Left;  . Nodule  back  05/17/2015  . PORTACATH PLACEMENT  05/10/2012   Procedure: INSERTION PORT-A-CATH;  Surgeon: Merrie Roof, MD;  Location: [facility];  Service: General;  Laterality: Left;    There were no vitals filed for this visit.  Subjective Assessment - 08/04/17 1027    Subjective  Pt states she is doing pretty good today - yesterday was a dizzy day because she she was bending over alot with cleaning house    Pertinent History  metastatic breast CA with mets to cerebellum, ribs, spine; T4 paraplegia due to metastatic lesions, neuropathy, HTN, osteopenia, CKD, OA    Limitations  Walking    Patient Stated Goals  Get rid  of the dizziness "if that is realistic"               NeuroRe-ed:   Rt Dix-Hallpike (-) negative with no nystagmus and no c/o vertigo in test position  Pt performed sit to Rt sidelying test with no nystagmus and no c/o vertigo     Pt performed x1 viewing in standing - 30 secs horizontal and 30 secs vertical;  Pt states she is currently using a dot-  Suggested a more specific target such as a letter for improved acuity and accuracy during gaze stabilization              Balance Exercises - 08/04/17 1028      Balance Exercises: Standing   Standing Eyes Opened  Narrow base of support (BOS);Wide (BOA);Head turns;Foam/compliant surface;5 reps    Standing Eyes Closed  Narrow base of support (BOS);Wide (BOA);Head turns;Foam/compliant surface;5 reps    Tandem Stance  Eyes open;2 reps;10 secs    Other Standing Exercises  Pt performed SLS/coordination exercise of touching balance bubbles (3 in half circle) with each foot with CGA for safety;  performed cone taps iwth each foot for SLS - CGA with min assist needed for recovery of LOB; pt performed stepping over and back of orange hurdle 5 reps on each foot with CGA (performed on floor)      Above tandem stance was performed with pt standing on floor in partial tandem stance - with CGA ; 10 secs with Rt foot in front, 10 secs with Lt foot in front     PT Short Term Goals - 07/21/17 0858      PT SHORT TERM GOAL #1   Title  Pt will tolerate repositioning maneuvers for R posterior canal cupulolithiasis    Time  4    Period  Weeks    Status  Achieved      PT SHORT TERM GOAL #2   Title  Pt will participate in further gait and balance testing: gait velocity and FGA    Time  4    Period  Weeks    Status  Achieved      PT SHORT TERM GOAL #3   Title  Pt will initiate gaze adaptation and balance HEP with supervision    Time  4    Period  Weeks    Status  On-going    Target Date  08/11/17        PT Long Term Goals -  07/21/17 0858      PT LONG TERM GOAL #1   Title  Independent with vestibular and balance HEP    Time  8  Period  Weeks    Status  New    Target Date  09/10/17      PT LONG TERM GOAL #2   Title  Pt will demonstrate negative positional vestibular testing    Baseline  R posterior canal cupulolithiasis    Time  8    Period  Weeks    Status  New    Target Date  09/10/17      PT LONG TERM GOAL #3   Title  Pt will improve use of VOR as indicated by DVA 2-3 line difference    Baseline  deferred; pt demonstrated 0 line difference when assessed.    Status  Deferred      PT LONG TERM GOAL #4   Title  Pt will improve gait velocity to >/= 3.6 ft/sec with cane for increased safety during community gait    Baseline  3.07 ft/sec    Time  8    Period  Weeks    Status  Revised    Target Date  09/10/17      PT LONG TERM GOAL #5   Title  Pt will improve FGA by 4 points; using cane to perform as needed    Baseline  16/30    Time  8    Period  Weeks    Status  Revised    Target Date  09/10/17      PT LONG TERM GOAL #6   Title  Pt will improve overall function as indicated by FOTO to >/= 60%    Baseline  51% function    Time  8    Period  Weeks    Status  New            Plan - 08/04/17 1031    Clinical Impression Statement  Pt had negative Rt Dix-Hallpike test with no c/o vertigo and no nystagmus in test position, indicative of resolution of Rt BPPV.  Pt stated dizziness was much improved compared to that experienced in previous PT session.  Pt declined using foot up brace due to neuropathic pain in feet again today.  Pt did well with balance exercises on compliant surface with min assist needed for recovery of LOB.    Rehab Potential  Fair    Clinical Impairments Affecting Rehab Potential  currently undergoing chemotherapy for metastatic cancer, sensory changes affecting balance and vestibular system    PT Frequency  2x / week    PT Duration  8 weeks    PT  Treatment/Interventions  ADLs/Self Care Home Management;Canalith Repostioning;DME Instruction;Gait training;Functional mobility training;Therapeutic activities;Therapeutic exercise;Balance training;Neuromuscular re-education;Patient/family education;Vestibular    PT Next Visit Plan   Foot up brace trial if pt agrees; please add corner balance to HEP as not done on 08-03-17 due to time constraint    PT Home Exercise Plan  HEP- standing hip flexion, abduction, chest stretch, heel/toe raise    Consulted and Agree with Plan of Care  Patient       Patient will benefit from skilled therapeutic intervention in order to improve the following deficits and impairments:  Decreased balance, Dizziness, Impaired sensation, Difficulty walking  Visit Diagnosis: Unsteadiness on feet  Dizziness and giddiness     Problem List Patient Active Problem List   Diagnosis Date Noted  . Brain metastasis ([place]) 11/18/2016  . Genetic testing 07/11/2016  . ALLERGY TO CHLORHEXADINE, USE BETADINE ONLY 06/23/2016  . Kyphosis of thoracic region 06/13/2016  . Plantar fasciitis, bilateral 04/20/2016  . Neuropathic pain  01/27/2016  . Anemia of chronic disease 05/21/2015  . Metastatic cancer to spine ([place]) 03/27/2015  . Thrombocytopenia ([place]) 03/12/2015  . Prerenal azotemia 03/12/2015  . Pain   . Recurrent UTI--due to Klebsiella   . Paraplegia at T4 level ([place]) 02/20/2015  . Neurogenic bowel 02/20/2015  . Neurogenic bladder 02/20/2015  . Postoperative anemia due to acute blood loss 02/17/2015  . Spinal cord compression due to malignant neoplasm metastatic to spine ([place]) 02/15/2015  . Epidural mass 02/15/2015  . Thoracic spine tumor   . Pathologic fracture of vertebra 02/14/2015  . Pathologic fracture of thoracic vertebrae 02/14/2015  . Breast cancer metastasized to bone, right ([place]) 02/14/2015  . Hyperlipidemia 02/14/2015  . H. pylori infection   . HTN (hypertension) 04/17/2012  . Primary cancer of upper outer  quadrant of right female breast ([place]) 03/08/2012    Gurtha Picker, [place], PT 08/04/2017, 10:37 AM  [facility] [address] [place], [place], [address] Phone: [phone]   Fax:  [phone]  Name: SHALITA NOTTE MRN: [id number] Date of Birth: Dec 19, 1945

## 2017-08-07 ENCOUNTER — Ambulatory Visit: Payer: Medicare Other | Attending: Neurosurgery | Admitting: Physical Therapy

## 2017-08-07 ENCOUNTER — Encounter: Payer: Self-pay | Admitting: Physical Therapy

## 2017-08-07 DIAGNOSIS — M6281 Muscle weakness (generalized): Secondary | ICD-10-CM | POA: Insufficient documentation

## 2017-08-07 DIAGNOSIS — R42 Dizziness and giddiness: Secondary | ICD-10-CM | POA: Diagnosis not present

## 2017-08-07 DIAGNOSIS — R2681 Unsteadiness on feet: Secondary | ICD-10-CM | POA: Diagnosis not present

## 2017-08-07 DIAGNOSIS — H8111 Benign paroxysmal vertigo, right ear: Secondary | ICD-10-CM | POA: Insufficient documentation

## 2017-08-07 DIAGNOSIS — R2689 Other abnormalities of gait and mobility: Secondary | ICD-10-CM | POA: Diagnosis not present

## 2017-08-07 NOTE — Therapy (Signed)
[facility] [address] [place], [place], [address] Phone: [phone]   Fax:  [phone]  Physical Therapy Treatment  Patient Details  Name: Heather [name] MRN: [id number] Date of Birth: [birth year] Referring Provider: [name], MD   Encounter Date: 08/07/2017  PT End of Session - 08/07/17 1336    Visit Number  8    Number of Visits  17    Date for PT Re-Evaluation  09/10/17    Authorization Type  Medicare, BCBS    PT Start Time  1020    PT Stop Time  1103    PT Time Calculation (min)  43 min    Activity Tolerance  Patient tolerated treatment well    Behavior During Therapy  [facility] for tasks assessed/performed       Past Medical History:  Diagnosis Date  . Anemia    hx of  . Anxiety    r/t updated surgery  . Breast cancer ([place])    right  . Cataract    immature-not sure of which eye  . Complication of anesthesia    pt states she is sensitive to meds  . Dizziness    has been going on for 33month;medical MD aware  . Genetic testing 07/11/2016   Test Results: Normal; No pathogenic mutations detected  Genes Analyzed: 43 genes on [employer]'s Common Cancers panel (APC, ATM, AXIN2, BARD1, BMPR1A, BRCA1, BRCA2, BRIP1, CDH1, CDKN2A, CHEK2, DICER1, EPCAM, GREM1, HOXB13, KIT, MEN1, MLH1, MSH2, MSH6, MUTYH, NBN, NF1, PALB2, PDGFRA, PMS2, POLD1, POLE, PTEN, RAD50, RAD51C, RAD51D, SDHA, SDHB, SDHC, SDHD, SMAD4, SMARCA4, STK11, TP53, TSC1, TSC2, VHL).  .Marland KitchenGERD (gastroesophageal reflux disease)    Tums prn  . H. pylori infection   . H/O hiatal hernia   . Hemorrhoids   . History of UTI   . Hx of radiation therapy 10/25/12- 12/11/12   right chest wall/regional lymph nodes 5040 cGy, 28 sessions, right chest wall boost 1000 cGy 1 session  . Hx: UTI (urinary tract infection)   . Hyperlipidemia    but not on meds;diet and exercise controlled  . Hypertension    recently started Aldactone   . Insomnia    takes Melatoniin daily  .  Metastasis to spinal column ([facility])    T5, Left  Femur cancer- radiation   . Neuropathy    "from back surgery"  . PONV (postoperative nausea and vomiting)    pt experienced hair loss, confusion and combative- 02/15/15  . Right knee pain   . Right shoulder pain   . Skin cancer    squamous, Nodule to back - "same cancer as breast."  . Urinary frequency    d/t taking Aldactone    Past Surgical History:  Procedure Laterality Date  . COLONOSCOPY    . cyst removed from left breast  1970  . DECOMPRESSIVE LUMBAR LAMINECTOMY LEVEL 4 N/A 02/15/2015   Procedure: DECOMPRESSION T5 AND T3-T7 STABALIZATION;  Surgeon: NConsuella Lose MD;  [facility];  Service: Neurosurgery;  Laterality: N/A;  . ESOPHAGOGASTRODUODENOSCOPY    . LAMINECTOMY N/A 03/27/2015   Procedure: Thoracic Four-Thoracic Six Laminectomy for tumor;  Surgeon: NConsuella Lose MD;  [facility];  Service: Neurosurgery;  Laterality: N/A;  T4-T6 Laminectomy  . left wrist surgery   2004   with plate  . MASTECTOMY MODIFIED RADICAL  05/10/2012   Procedure: MASTECTOMY MODIFIED RADICAL;  Surgeon: PMerrie Roof MD;  [facility]  Service: General;  Laterality:  Right;  RIGHT MODIFIED RADICAL MASTECTOMY  . MASTECTOMY, RADICAL Right   . MINOR BREAST BIOPSY Left 05/16/2016   Procedure: EXCISION OF BACK NODULE;  Surgeon: Autumn Messing III, MD;  Location: [facility];  Service: General;  Laterality: Left;  . Nodule  back  05/17/2015  . PORTACATH PLACEMENT  05/10/2012   Procedure: INSERTION PORT-A-CATH;  Surgeon: Merrie Roof, MD;  Location: [facility];  Service: General;  Laterality: Left;    There were no vitals filed for this visit.  Subjective Assessment - 08/07/17 1024    Subjective  Did a lot of standing this weekend to help son move.  2 episodes of dizziness this weekend.      Pertinent History  metastatic breast CA with mets to cerebellum, ribs, spine; T4 paraplegia due to metastatic lesions, neuropathy,  HTN, osteopenia, CKD, OA    Limitations  Walking    Patient Stated Goals  Get rid of the dizziness "if that is realistic"    Currently in Pain?  No/denies      Performed and provided the following standing balance exercises for pt HEP:   Feet Together (Compliant Surface) Head Motion - Eyes Open    With eyes open, standing on compliant surface: pillow, feet together, move head slowly: up and down 10 times, side to side 10 times. Repeat 2 times per session. Do 2 sessions per day.   Balance: Eyes Closed - Bilateral (Varied Surfaces)    Stand, feet TOGETHER ON A PILLOW; close eyes. Maintain balance 30 seconds. Repeat 2 times per set. Do 2 sets per session.   Feet Apart (Compliant Surface) Head Motion - Eyes Closed    Stand on compliant surface: PILLOW with feet shoulder width apart. Close eyes and move head slowly, up and down 10 times, side to side 10 times. Repeat 2 times per session. Do 2 sessions per day.    [facility] Adult PT Treatment/Exercise - 08/07/17 1029      Self-Care   Self-Care  Posture    Posture  Discussed safety with bending down to clean bottom of toilet and risk for falling if dizzy and risk for compression fracture due to compromised state of spine. Discussed options for safely cleaning without bending.    Other Self-Care Comments   Will continue to hold on foot up brace due to pt reporting decreased foot drop and drag recently      Therapeutic Activites    Therapeutic Activities  Lifting    Lifting  High squat training reaching to medium height surface picking up cones x 6 and pivoting to place on taller surface (avoiding rotation at lumbar spine) and then performing slightly lower wide squats with one UE support on cane for increased balance             PT Education - 08/07/17 1336    Education provided  Yes    Education Details  corner balance HEP, safety with lifting and avoiding bending due to compromised condition of spine    Person(s) Educated   Patient    Methods  Explanation;Handout    Comprehension  Verbalized understanding;Returned demonstration       PT Short Term Goals - 08/07/17 1335      PT SHORT TERM GOAL #1   Title  Pt will tolerate repositioning maneuvers for R posterior canal cupulolithiasis    Time  4    Period  Weeks    Status  Achieved      PT SHORT  TERM GOAL #2   Title  Pt will participate in further gait and balance testing: gait velocity and FGA    Time  4    Period  Weeks    Status  Achieved      PT SHORT TERM GOAL #3   Title  Pt will initiate gaze adaptation and balance HEP with supervision    Time  4    Period  Weeks    Status  Achieved        PT Long Term Goals - 07/21/17 2355      PT LONG TERM GOAL #1   Title  Independent with vestibular and balance HEP    Time  8    Period  Weeks    Status  New    Target Date  09/10/17      PT LONG TERM GOAL #2   Title  Pt will demonstrate negative positional vestibular testing    Baseline  R posterior canal cupulolithiasis    Time  8    Period  Weeks    Status  New    Target Date  09/10/17      PT LONG TERM GOAL #3   Title  Pt will improve use of VOR as indicated by DVA 2-3 line difference    Baseline  deferred; pt demonstrated 0 line difference when assessed.    Status  Deferred      PT LONG TERM GOAL #4   Title  Pt will improve gait velocity to >/= 3.6 ft/sec with cane for increased safety during community gait    Baseline  3.07 ft/sec    Time  8    Period  Weeks    Status  Revised    Target Date  09/10/17      PT LONG TERM GOAL #5   Title  Pt will improve FGA by 4 points; using cane to perform as needed    Baseline  16/30    Time  8    Period  Weeks    Status  Revised    Target Date  09/10/17      PT LONG TERM GOAL #6   Title  Pt will improve overall function as indicated by FOTO to >/= 60%    Baseline  51% function    Time  8    Period  Weeks    Status  New            Plan - 08/07/17 1105    Clinical Impression  Statement  Reviewed corner balance HEP pt instructed in last session; pt return demonstrated and handout given.  Pt educated on safety with home activities due to compromised condition of spine to reduce risk for falls and fracture.  Pt verbalized understanding and verbalized other ways to perform these activities more safely (husband perform for her, hiring help, long handled mop, etc).  Pt tolerated well, will continue to address and progress towards LTG.    Rehab Potential  Fair    Clinical Impairments Affecting Rehab Potential  currently undergoing chemotherapy for metastatic cancer, sensory changes affecting balance and vestibular system    PT Frequency  2x / week    PT Duration  8 weeks    PT Treatment/Interventions  ADLs/Self Care Home Management;Canalith Repostioning;DME Instruction;Gait training;Functional mobility training;Therapeutic activities;Therapeutic exercise;Balance training;Neuromuscular re-education;Patient/family education;Vestibular    PT Next Visit Plan  gentle UE/LE strengthening HEP - light weights, avoid bending due to spine.  Practice reaching low cabinet > overhead  keeping neutral spine.  Progress x 1 viewing and corner balance    PT Home Exercise Plan  HEP- standing hip flexion, abduction, chest stretch, heel/toe raise    Consulted and Agree with Plan of Care  Patient       Patient will benefit from skilled therapeutic intervention in order to improve the following deficits and impairments:  Decreased balance, Dizziness, Impaired sensation, Difficulty walking  Visit Diagnosis: Unsteadiness on feet  Dizziness and giddiness  Other abnormalities of gait and mobility     Problem List Patient Active Problem List   Diagnosis Date Noted  . Brain metastasis ([place]) 11/18/2016  . Genetic testing 07/11/2016  . ALLERGY TO CHLORHEXADINE, USE BETADINE ONLY 06/23/2016  . Kyphosis of thoracic region 06/13/2016  . Plantar fasciitis, bilateral 04/20/2016  . Neuropathic pain  01/27/2016  . Anemia of chronic disease 05/21/2015  . Metastatic cancer to spine ([place]) 03/27/2015  . Thrombocytopenia ([place]) 03/12/2015  . Prerenal azotemia 03/12/2015  . Pain   . Recurrent UTI--due to Klebsiella   . Paraplegia at T4 level ([place]) 02/20/2015  . Neurogenic bowel 02/20/2015  . Neurogenic bladder 02/20/2015  . Postoperative anemia due to acute blood loss 02/17/2015  . Spinal cord compression due to malignant neoplasm metastatic to spine ([place]) 02/15/2015  . Epidural mass 02/15/2015  . Thoracic spine tumor   . Pathologic fracture of vertebra 02/14/2015  . Pathologic fracture of thoracic vertebrae 02/14/2015  . Breast cancer metastasized to bone, right ([place]) 02/14/2015  . Hyperlipidemia 02/14/2015  . H. pylori infection   . HTN (hypertension) 04/17/2012  . Primary cancer of upper outer quadrant of right female breast ([place]) 03/08/2012    Rico Junker, PT, DPT 08/07/17    1:42 PM    [facility] [address] [place], [place], [address] Phone: [phone]   Fax:  [phone]  Name: BRANDALYN HARTING MRN: [id number] Date of Birth: 30-Nov-1945

## 2017-08-07 NOTE — Patient Instructions (Signed)
HAVE A CHAIR IN FRONT OF YOU FOR SUPPORT  CAN TRY BAREFOOT    Feet Together (Compliant Surface) Head Motion - Eyes Open    With eyes open, standing on compliant surface: pillow, feet together, move head slowly: up and down 10 times, side to side 10 times. Repeat 2 times per session. Do 2 sessions per day.   Balance: Eyes Closed - Bilateral (Varied Surfaces)    Stand, feet TOGETHER ON A PILLOW; close eyes. Maintain balance 30 seconds. Repeat 2 times per set. Do 2 sets per session.   Feet Apart (Compliant Surface) Head Motion - Eyes Closed    Stand on compliant surface: PILLOW with feet shoulder width apart. Close eyes and move head slowly, up and down 10 times, side to side 10 times. Repeat 2 times per session. Do 2 sessions per day.

## 2017-08-10 ENCOUNTER — Inpatient Hospital Stay (HOSPITAL_BASED_OUTPATIENT_CLINIC_OR_DEPARTMENT_OTHER): Payer: Medicare Other | Admitting: Hematology and Oncology

## 2017-08-10 ENCOUNTER — Other Ambulatory Visit: Payer: Self-pay | Admitting: Hematology and Oncology

## 2017-08-10 ENCOUNTER — Inpatient Hospital Stay: Payer: Medicare Other

## 2017-08-10 ENCOUNTER — Other Ambulatory Visit: Payer: Self-pay

## 2017-08-10 ENCOUNTER — Other Ambulatory Visit: Payer: Self-pay | Admitting: *Deleted

## 2017-08-10 ENCOUNTER — Encounter: Payer: Self-pay | Admitting: Hematology and Oncology

## 2017-08-10 ENCOUNTER — Telehealth: Payer: Self-pay | Admitting: Hematology and Oncology

## 2017-08-10 ENCOUNTER — Inpatient Hospital Stay: Payer: Medicare Other | Attending: Hematology and Oncology

## 2017-08-10 ENCOUNTER — Telehealth: Payer: Self-pay | Admitting: Pharmacist

## 2017-08-10 VITALS — BP 152/73 | HR 66 | Temp 97.5°F | Resp 18 | Ht 64.0 in | Wt 129.2 lb

## 2017-08-10 VITALS — BP 102/49 | HR 67 | Temp 97.1°F | Resp 17

## 2017-08-10 DIAGNOSIS — Z923 Personal history of irradiation: Secondary | ICD-10-CM | POA: Insufficient documentation

## 2017-08-10 DIAGNOSIS — C7951 Secondary malignant neoplasm of bone: Secondary | ICD-10-CM

## 2017-08-10 DIAGNOSIS — Z9221 Personal history of antineoplastic chemotherapy: Secondary | ICD-10-CM | POA: Insufficient documentation

## 2017-08-10 DIAGNOSIS — Z5112 Encounter for antineoplastic immunotherapy: Secondary | ICD-10-CM | POA: Insufficient documentation

## 2017-08-10 DIAGNOSIS — C7931 Secondary malignant neoplasm of brain: Secondary | ICD-10-CM | POA: Insufficient documentation

## 2017-08-10 DIAGNOSIS — C50411 Malignant neoplasm of upper-outer quadrant of right female breast: Secondary | ICD-10-CM

## 2017-08-10 DIAGNOSIS — Z7189 Other specified counseling: Secondary | ICD-10-CM | POA: Insufficient documentation

## 2017-08-10 DIAGNOSIS — Z79899 Other long term (current) drug therapy: Secondary | ICD-10-CM | POA: Insufficient documentation

## 2017-08-10 DIAGNOSIS — C50911 Malignant neoplasm of unspecified site of right female breast: Secondary | ICD-10-CM

## 2017-08-10 LAB — CBC WITH DIFFERENTIAL (CANCER CENTER ONLY)
Basophils Absolute: 0 10*3/uL (ref 0.0–0.1)
Basophils Relative: 1 %
Eosinophils Absolute: 0 10*3/uL (ref 0.0–0.5)
Eosinophils Relative: 1 %
HCT: 36.3 % (ref 34.8–46.6)
Hemoglobin: 11.7 g/dL (ref 11.6–15.9)
Lymphocytes Relative: 19 %
Lymphs Abs: 0.3 10*3/uL — ABNORMAL LOW (ref 0.9–3.3)
MCH: 26.3 pg (ref 25.1–34.0)
MCHC: 32.3 g/dL (ref 31.5–36.0)
MCV: 81.3 fL (ref 79.5–101.0)
Monocytes Absolute: 0 10*3/uL — ABNORMAL LOW (ref 0.1–0.9)
Monocytes Relative: 3 %
Neutro Abs: 1.1 10*3/uL — ABNORMAL LOW (ref 1.5–6.5)
Neutrophils Relative %: 76 %
Platelet Count: 81 10*3/uL — ABNORMAL LOW (ref 145–400)
RBC: 4.46 MIL/uL (ref 3.70–5.45)
RDW: 15.9 % — ABNORMAL HIGH (ref 11.2–14.5)
WBC Count: 1.5 10*3/uL — ABNORMAL LOW (ref 3.9–10.3)

## 2017-08-10 LAB — CMP (CANCER CENTER ONLY)
ALT: 16 U/L (ref 0–55)
AST: 35 U/L — ABNORMAL HIGH (ref 5–34)
Albumin: 3.7 g/dL (ref 3.5–5.0)
Alkaline Phosphatase: 82 U/L (ref 40–150)
Anion gap: 8 (ref 3–11)
BUN: 14 mg/dL (ref 7–26)
CO2: 27 mmol/L (ref 22–29)
Calcium: 11.6 mg/dL — ABNORMAL HIGH (ref 8.4–10.4)
Chloride: 102 mmol/L (ref 98–109)
Creatinine: 0.95 mg/dL (ref 0.60–1.10)
GFR, Est AFR Am: >60 mL/min (ref >60)
GFR, Estimated: 59 mL/min — ABNORMAL LOW (ref >60)
Glucose, Bld: 98 mg/dL (ref 70–140)
Potassium: 4 mmol/L (ref 3.5–5.1)
Sodium: 137 mmol/L (ref 136–145)
Total Bilirubin: 0.5 mg/dL (ref 0.2–1.2)
Total Protein: 7.6 g/dL (ref 6.4–8.3)

## 2017-08-10 LAB — BASIC METABOLIC PANEL
Anion gap: 7 (ref 3–11)
BUN: 14 mg/dL (ref 7–26)
CO2: 28 mmol/L (ref 22–29)
Calcium: 11.6 mg/dL — ABNORMAL HIGH (ref 8.4–10.4)
Chloride: 103 mmol/L (ref 98–109)
Creatinine, Ser: 0.95 mg/dL (ref 0.60–1.10)
GFR calc Af Amer: >60 mL/min (ref >60)
GFR calc non Af Amer: 59 mL/min — ABNORMAL LOW (ref >60)
Glucose, Bld: 97 mg/dL (ref 70–140)
Potassium: 4 mmol/L (ref 3.5–5.1)
Sodium: 138 mmol/L (ref 136–145)

## 2017-08-10 MED ORDER — HEPARIN SOD (PORK) LOCK FLUSH 100 UNIT/ML IV SOLN
500.0000 [IU] | Freq: Once | INTRAVENOUS | Status: AC | PRN
  Administered 2017-08-10: 500 [IU]
  Filled 2017-08-10: qty 5

## 2017-08-10 MED ORDER — SODIUM CHLORIDE 0.9 % IV SOLN
Freq: Once | INTRAVENOUS | Status: AC
  Administered 2017-08-10: 11:00:00 via INTRAVENOUS

## 2017-08-10 MED ORDER — PALBOCICLIB 100 MG PO CAPS: 100.0000 mg | ORAL_CAPSULE | Freq: Every day | ORAL | 6 refills | Status: DC

## 2017-08-10 MED ORDER — FULVESTRANT 250 MG/5ML IM SOLN
500.0000 mg | Freq: Once | INTRAMUSCULAR | Status: AC
  Administered 2017-08-10: 500 mg via INTRAMUSCULAR

## 2017-08-10 MED ORDER — ACETAMINOPHEN 325 MG PO TABS
ORAL_TABLET | ORAL | Status: AC
  Filled 2017-08-10: qty 2

## 2017-08-10 MED ORDER — SODIUM CHLORIDE 0.9 % IV SOLN
420.0000 mg | Freq: Once | INTRAVENOUS | Status: AC
  Administered 2017-08-10: 420 mg via INTRAVENOUS
  Filled 2017-08-10: qty 14

## 2017-08-10 MED ORDER — ACETAMINOPHEN 325 MG PO TABS
650.0000 mg | ORAL_TABLET | Freq: Once | ORAL | Status: AC
  Administered 2017-08-10: 650 mg via ORAL

## 2017-08-10 MED ORDER — SODIUM CHLORIDE 0.9% FLUSH
10.0000 mL | INTRAVENOUS | Status: DC | PRN
Start: 2017-08-10 — End: 2017-08-10
  Administered 2017-08-10: 10 mL
  Filled 2017-08-10: qty 10

## 2017-08-10 MED ORDER — FULVESTRANT 250 MG/5ML IM SOLN
INTRAMUSCULAR | Status: AC
  Filled 2017-08-10: qty 10

## 2017-08-10 MED ORDER — TRASTUZUMAB CHEMO 150 MG IV SOLR
6.0000 mg/kg | Freq: Once | INTRAVENOUS | Status: AC
  Administered 2017-08-10: 357 mg via INTRAVENOUS
  Filled 2017-08-10: qty 17

## 2017-08-10 NOTE — Progress Notes (Addendum)
[facility] Follow up:    [name], MD [place] [address]   DIAGNOSIS: Cancer Staging Primary cancer of upper outer quadrant of right female breast [facility]) Staging form: Breast, AJCC 7th Edition - Clinical: Stage IIB (T2, N1, cM0) - Unsigned Staging comments: Staged at breast conference 11.6.13  - Pathologic: No stage assigned - Unsigned   SUMMARY OF ONCOLOGIC HISTORY:   Primary cancer of upper outer quadrant of right female breast ([name])   03/08/2012 Initial Diagnosis    Right breast invasive ductal carcinoma ER positive PR negative HER-2 positive Ki-67 44%; another breast mass biopsied in the anterior part of the breast which was also positive for malignancy that was HER-2 negative      05/10/2012 Surgery    Right mastectomy and axillary lymph node dissection: Multifocal disease 5 cm, 1.7 cm, 1.6 cm, ER positive PR negative HER-2 positive Ki-67 44%, 3/7 lymph nodes positive      06/14/2012 - 06/12/2013 Chemotherapy    Adjuvant chemotherapy with [place] 6 followed by Herceptin maintenance      10/25/2012 - 12/11/2012 Radiation Therapy    Adjuvant radiation therapy      02/07/2013 -  Anti-estrogen oral therapy    Letrozole 2.5 mg daily      02/14/2015 Imaging    MRI spine: Large destructive T5 lesion with severe pathologic compression fracture and extensive epidural tumor, severe spinal stenosis with moderate cord compression, tumor involvement of T4      03/18/2015 PET scan    Residual enhancing soft tissue adjacent to the spinal cord. No evidence of metastatic disease. Nonspecific uptake the left nipple      03/27/2015 - 03/30/2015 Hospital Admission    T4-6 decompression for spinal cord compression (lower extremity paralysis)      04/10/2015 -  Chemotherapy    Palliative treatment with Herceptin every 3 weeks with letrozole 2.5 mg daily      04/13/2015 - 05/19/2015 Radiation Therapy    Palliative radiation treatment to  the spine      09/01/2015 Imaging    CT chest abdomen pelvis: Pathologic fracture with posterior fusion at T5 no other evidence of metastatic disease in the chest abdomen pelvis      12/23/2015 Imaging    CT CAP: new nonspecific 0.6 cm lymph node was stated mediastinum needs follow-up CT, innumerable tiny groundglass pulmonary nodules throughout both lungs unchanged, patchy consolidation from radiation, T5 fracture, no mets      04/15/2016 PET scan    Rt para-spinal mass mildy hypermetabolic SUV 5.9, lytic cortical lesion 4.8 cm lesion left prox femur suv 8.8 favor osseus met disease      04/28/2016 - 05/12/2016 Radiation Therapy    Palliative XRT Left femur      05/24/2016 Procedure    Soft tissue mass biopsy back: Metastatic breast cancer ER 80%, PR 0%, Ki-67 50%, HER-2 positive ratio 2.25      06/09/2016 - 03/29/2017 Anti-estrogen oral therapy    Faslodex with Herceptin and Perjeta every 4 weeks      06/13/2016 - 06/15/2016 Hospital Admission    uncomplicated revision of previous thoracic instrumentation.      10/10/2016 PET scan    Interval development of new hypermetabolic 2.5 x 1.1 cm lesion posterior right eighth rib consistent with metastatic disease, stable right paraspinal lesion at T8, clustered soft tissue nodules in the posterior midline back near cervicothoracic junction smaller than before  11/03/2016 - 11/07/2016 Radiation Therapy    Palliative radiation to right eighth rib      11/04/2016 Imaging    Solitary contrast-enhancing lesion in the right cerebellum 6 x 2 mm concerning for metastatic lesion      11/24/2016 - 11/24/2016 Radiation Therapy    SRS to the brain lesion      04/18/2017 PET scan    New ill-defined rounded airspace opacity in posterior right lower lobe. Differential diagnosis includes malignancy and infectious/inflammatory process.Increased hypermetabolic lytic lesion in T8 vertebral body, consistent with bone metastasis.  Increased hypermetabolic  posterior upper chest wall subcutaneous nodules, highly suspicious metastatic disease. Further improvement in right posterior eighth rib metastasis.      05/04/2017 - 07/27/2017 Chemotherapy    Kadcyla every 3 weeks palliative chemotherapy stopped for progression      07/26/2017 Imaging    Interval increase in size of the multiple enhancing masses posteriorly overlying the spine, interval development of left axillary and mediastinal adenopathy, increase in the size of lytic lesions in T8-T9 and left ninth rib, increased groundglass opacities right lower lobe lung      07/27/2017 -  Chemotherapy    Palbociclib + Faslodex + trastuzumab + Pertuzumab         CURRENT THERAPY: Palbociclib, Faslodex, Trastuzumab, Pertuzumab (Zometa every 3 months)  INTERVAL HISTORY: Heather [name] 72 y.o. female returns for evaluation.  She started Palbociclib and received Faslodex on 07/27/2017.  She is due for another injection today as well.  She is tolerating it well aside from pain at the injection site..  She says that the lesions on her back growing is a concern for her.  She doesn't want them to continue to enlarge.  She is due to start Herceptin/Perjeta today.  She does not want any benadryl prior to receiving her treatment.     Patient Active Problem List   Diagnosis Date Noted  . Goals of care, counseling/discussion 08/10/2017  . Brain metastasis ([place]) 11/18/2016  . Genetic testing 07/11/2016  . ALLERGY TO CHLORHEXADINE, USE BETADINE ONLY 06/23/2016  . Kyphosis of thoracic region 06/13/2016  . Plantar fasciitis, bilateral 04/20/2016  . Neuropathic pain 01/27/2016  . Anemia of chronic disease 05/21/2015  . Metastatic cancer to spine ([place]) 03/27/2015  . Thrombocytopenia ([place]) 03/12/2015  . Prerenal azotemia 03/12/2015  . Pain   . Recurrent UTI--due to Klebsiella   . Paraplegia at T4 level ([place]) 02/20/2015  . Neurogenic bowel 02/20/2015  . Neurogenic bladder 02/20/2015  . Postoperative anemia  due to acute blood loss 02/17/2015  . Spinal cord compression due to malignant neoplasm metastatic to spine ([place]) 02/15/2015  . Epidural mass 02/15/2015  . Thoracic spine tumor   . Pathologic fracture of vertebra 02/14/2015  . Pathologic fracture of thoracic vertebrae 02/14/2015  . Breast cancer metastasized to bone, right ([place]) 02/14/2015  . Hyperlipidemia 02/14/2015  . H. pylori infection   . HTN (hypertension) 04/17/2012  . Primary cancer of upper outer quadrant of right female breast ([place]) 03/08/2012    is allergic to other; acyclovir and related; zanaflex [tizanidine hcl]; codeine; keflex [cephalexin]; naprosyn [naproxen]; oxycodone; tramadol; and tussin [guaifenesin].  MEDICAL HISTORY: Past Medical History:  Diagnosis Date  . Anemia    hx of  . Anxiety    r/t updated surgery  . Breast cancer ([place])    right  . Cataract    immature-not sure of which eye  . Complication of anesthesia    pt states she is sensitive to  meds  . Dizziness    has been going on for 81month;medical MD aware  . Genetic testing 07/11/2016   Test Results: Normal; No pathogenic mutations detected  Genes Analyzed: 43 genes on [employer]'s Common Cancers panel (APC, ATM, AXIN2, BARD1, BMPR1A, BRCA1, BRCA2, BRIP1, CDH1, CDKN2A, CHEK2, DICER1, EPCAM, GREM1, HOXB13, KIT, MEN1, MLH1, MSH2, MSH6, MUTYH, NBN, NF1, PALB2, PDGFRA, PMS2, POLD1, POLE, PTEN, RAD50, RAD51C, RAD51D, SDHA, SDHB, SDHC, SDHD, SMAD4, SMARCA4, STK11, TP53, TSC1, TSC2, VHL).  .Marland KitchenGERD (gastroesophageal reflux disease)    Tums prn  . H. pylori infection   . H/O hiatal hernia   . Hemorrhoids   . History of UTI   . Hx of radiation therapy 10/25/12- 12/11/12   right chest wall/regional lymph nodes 5040 cGy, 28 sessions, right chest wall boost 1000 cGy 1 session  . Hx: UTI (urinary tract infection)   . Hyperlipidemia    but not on meds;diet and exercise controlled  . Hypertension    recently started Aldactone   . Insomnia    takes Melatoniin daily   . Metastasis to spinal column ([facility])    T5, Left  Femur cancer- radiation   . Neuropathy    "from back surgery"  . PONV (postoperative nausea and vomiting)    pt experienced hair loss, confusion and combative- 02/15/15  . Right knee pain   . Right shoulder pain   . Skin cancer    squamous, Nodule to back - "same cancer as breast."  . Urinary frequency    d/t taking Aldactone    SURGICAL HISTORY: Past Surgical History:  Procedure Laterality Date  . COLONOSCOPY    . cyst removed from left breast  1970  . DECOMPRESSIVE LUMBAR LAMINECTOMY LEVEL 4 N/A 02/15/2015   Procedure: DECOMPRESSION T5 AND T3-T7 STABALIZATION;  Surgeon: NConsuella Lose MD;  [facility];  Service: Neurosurgery;  Laterality: N/A;  . ESOPHAGOGASTRODUODENOSCOPY    . LAMINECTOMY N/A 03/27/2015   Procedure: Thoracic Four-Thoracic Six Laminectomy for tumor;  Surgeon: NConsuella Lose MD;  [facility];  Service: Neurosurgery;  Laterality: N/A;  T4-T6 Laminectomy  . left wrist surgery   2004   with plate  . MASTECTOMY MODIFIED RADICAL  05/10/2012   Procedure: MASTECTOMY MODIFIED RADICAL;  Surgeon: PMerrie Roof MD;  [place] [facility]  Service: General;  Laterality: Right;  RIGHT MODIFIED RADICAL MASTECTOMY  . MASTECTOMY, RADICAL Right   . MINOR BREAST BIOPSY Left 05/16/2016   Procedure: EXCISION OF BACK NODULE;  Surgeon: PAutumn MessingIII, MD;  [place]  Service: General;  Laterality: Left;  . Nodule  back  05/17/2015  . PORTACATH PLACEMENT  05/10/2012   Procedure: INSERTION PORT-A-CATH;  Surgeon: PMerrie Roof MD;  [facility];  Service: General;  Laterality: Left;    SOCIAL HISTORY: Social History   Socioeconomic History  . Marital status: Married    Spouse name: Not on file  . Number of children: 2  . Years of education: Not on file  . Highest education level: Not on file  Occupational History  . Not on file  Social Needs  . Financial resource strain: Not  on file  . Food insecurity:    Worry: Not on file    Inability: Not on file  . Transportation needs:    Medical: Not on file    Non-medical: Not on file  Tobacco Use  . Smoking status: Never Smoker  . Smokeless tobacco: Never Used  Substance and Sexual Activity  . Alcohol use: No  . Drug use: No  . Sexual activity: Yes    Birth control/protection: Post-menopausal  Lifestyle  . Physical activity:    Days per week: Not on file    Minutes per session: Not on file  . Stress: Not on file  Relationships  . Social connections:    Talks on phone: Not on file    Gets together: Not on file    Attends religious service: Not on file    Active member of club or organization: Not on file    Attends meetings of clubs or organizations: Not on file    Relationship status: Not on file  . Intimate partner violence:    Fear of current or ex partner: Not on file    Emotionally abused: Not on file    Physically abused: Not on file    Forced sexual activity: Not on file  Other Topics Concern  . Not on file  Social History Narrative  . Not on file    FAMILY HISTORY: Family History  Problem Relation Age of Onset  . Leukemia Mother 73       deceased 22  . Stroke Father   . Diabetes Mellitus II Father   . Prostate cancer Brother 42       currently 19  . Stomach cancer Maternal Grandmother 62       deceased 11  . Prostate cancer Brother 93       currently 75    Review of Systems  Constitutional: Positive for fatigue. Negative for appetite change, chills and unexpected weight change.  HENT:   Negative for hearing loss, lump/mass and trouble swallowing.   Eyes: Negative for eye problems and icterus.  Respiratory: Negative for chest tightness, cough and shortness of breath.   Cardiovascular: Negative for chest pain, leg swelling and palpitations.  Gastrointestinal: Negative for abdominal distention, abdominal pain, constipation, diarrhea, nausea and vomiting.  Endocrine: Negative for  hot flashes.       Hair thinning  Skin: Negative for itching and rash.  Neurological: Negative for dizziness, extremity weakness, headaches and numbness.  Hematological: Negative for adenopathy. Does not bruise/bleed easily.  Psychiatric/Behavioral: Negative for depression. The patient is not nervous/anxious.       PHYSICAL EXAMINATION  ECOG PERFORMANCE STATUS: 2 - Symptomatic, <50% confined to bed  Vitals:   08/10/17 0959  BP: (!) 152/73  Pulse: 66  Resp: 18  Temp: (!) 97.5 F (36.4 C)  SpO2: 100%    Physical Exam  Constitutional: She is oriented to person, place, and time and well-developed, well-nourished, and in no distress.  HENT:  Head: Normocephalic and atraumatic.  Mouth/Throat: Oropharynx is clear and moist. No oropharyngeal exudate.  Eyes: No scleral icterus.  Neck: Neck supple.  Cardiovascular: Normal rate, regular rhythm and normal heart sounds.  Pulmonary/Chest: Effort normal and breath sounds normal. No respiratory distress. She has no wheezes. She has no rales.  Abdominal: Soft. Bowel sounds are normal. She exhibits no distension and no mass. There is no tenderness. There is no rebound and no guarding.  Musculoskeletal: She exhibits no edema.  Lymphadenopathy:    She has no cervical adenopathy.  Neurological: She is alert and oriented to person, place, and time.  Skin: Skin is warm and dry. No rash noted.  Psychiatric: Mood and affect normal.    LABORATORY DATA:  CBC    Component Value Date/Time   WBC 6.2 07/27/2017 0836   RBC  4.62 07/27/2017 0836   HGB 12.2 07/27/2017 0836   HGB 12.0 05/05/2017 1046   HCT 38.1 07/27/2017 0836   HCT 36.9 05/05/2017 1046   PLT 131 (L) 07/27/2017 0836   PLT 210 05/05/2017 1046   MCV 82.5 07/27/2017 0836   MCV 83.1 05/05/2017 1046   MCH 26.4 07/27/2017 0836   MCHC 32.0 07/27/2017 0836   RDW 15.4 (H) 07/27/2017 0836   RDW 14.8 (H) 05/05/2017 1046   LYMPHSABS 0.3 (L) 07/27/2017 0836   LYMPHSABS 0.6 (L) 05/05/2017  1046   MONOABS 0.7 07/27/2017 0836   MONOABS 0.3 05/05/2017 1046   EOSABS 0.0 07/27/2017 0836   EOSABS 0.1 05/05/2017 1046   BASOSABS 0.0 07/27/2017 0836   BASOSABS 0.1 05/05/2017 1046    CMP     Component Value Date/Time   NA 138 08/10/2017 0853   NA 137 05/05/2017 1046   K 4.0 08/10/2017 0853   K 3.9 05/05/2017 1046   CL 103 08/10/2017 0853   CL 101 10/25/2012 0946   CO2 28 08/10/2017 0853   CO2 26 05/05/2017 1046   GLUCOSE 97 08/10/2017 0853   GLUCOSE 88 05/05/2017 1046   GLUCOSE 90 10/25/2012 0946   BUN 14 08/10/2017 0853   BUN 14.1 05/05/2017 1046   CREATININE 0.95 08/10/2017 0853   CREATININE 0.8 05/05/2017 1046   CALCIUM 11.6 (H) 08/10/2017 0853   CALCIUM 10.3 05/05/2017 1046   PROT 7.4 07/27/2017 0836   PROT 7.2 05/05/2017 1046   ALBUMIN 3.4 (L) 07/27/2017 0836   ALBUMIN 3.7 05/05/2017 1046   AST 67 (H) 07/27/2017 0836   AST 26 05/05/2017 1046   ALT 43 07/27/2017 0836   ALT 17 05/05/2017 1046   ALKPHOS 90 07/27/2017 0836   ALKPHOS 74 05/05/2017 1046   BILITOT 0.5 07/27/2017 0836   BILITOT 0.43 05/05/2017 1046   GFRNONAA 59 (L) 08/10/2017 0853   GFRAA >60 08/10/2017 0853          ASSESSMENT and PLAN:   Primary cancer of upper outer quadrant of right female breast ([place]) Right breast invasive ductal carcinoma ER positive PR negative HER-2 positive Ki-67 44% multifocal disease 3/7 lymph nodes positive T2 N1 M0 stage IIB status post adjuvant chemotherapy with TCH followed by Herceptin maintenance, adjuvant radiation therapy and Letrozole. MRI 02/14/2015: T5 large destructive lesion with pathologic compression fracture and extensive epidural tumor, involvement of T4, excision metastatic carcinoma ER 60%, PR 0%, HER-2 positive ratio 2.71 average copy #7.45 03/27/2015: T4-6 decompression surgery for spinal cord compression PET-CT 12/10/17Rt para-spinal mass mildy hypermetabolic SUV 5.9, lytic cortical lesion 4.8 cm lesion left prox femur suv 8.8 favor osseus  met disease Echo 04/12/2017 shows well preserved ejection fraction of 55-60%   Treatment summary: 1. Resumed anastrozole 05/21/2015, but it has caused significant hair loss so we switched her to exemestane 25 mg once daily 07/23/2015 2. Herceptin every 3 week started 04/10/2015 3. Bone metastases: Zometa every 3 months-next due late May/early June 4. Radiation therapy to the spine (Dr. [name]) 04/13/2015 to 05/19/2015 5. Radiation therapy to left femur 04/28/2016 to 05/12/2016  6. SRS to the brain lesion 11/24/2016 7.  Kadcyla started 05/05/2017 discontinued 07/27/2017 -------------------------------------------------------------------------------------------------------------------------- Current treatment: Palbociclib + Faslodex + trastuzumab + Pertuzumab started 08/10/2017  Toxicities of palbociclib:  Awaiting CBC.   She will proceed with Faslodex and Trastuzumab, Pertuzumab today.   F/u in 2 weeks with labs prior.       All questions were answered. The patient knows to call the  clinic with any problems, questions or concerns. We can certainly see the patient much sooner if necessary.  A total of (30) minutes of face-to-face time was spent with this patient with greater than 50% of that time in counseling and care-coordination.  This note was electronically signed. Scot Dock, NP 08/10/2017   Attending Note  I personally saw and examined Heather [name]. The plan of care was discussed with her. I agree with the assessment and plan as documented above. Monitoring visit on Faslodex along with Herceptin Perjeta and palbociclib. Today she receives Faslodex along with trastuzumab and Pertuzumab. Patient has a neutrophil count of 1.1 and a platelet count of 81.  We will reduce the dosage of palbociclib to 100 mg daily. She will come back in 2 weeks for recheck of her blood counts. Signed Harriette Ohara, MD

## 2017-08-10 NOTE — Progress Notes (Signed)
No reload per MD. Kennith Center, Pharm.D., CPP 08/10/2017@11 :14 AM

## 2017-08-10 NOTE — Patient Instructions (Signed)
[facility] Discharge Instructions for Patients Receiving Chemotherapy  Today you received the following chemotherapy agents Herceptin, Perjeta, Faslodex  To help prevent nausea and vomiting after your treatment, we encourage you to take your nausea medication as directed   If you develop nausea and vomiting that is not controlled by your nausea medication, call the clinic.   BELOW ARE SYMPTOMS THAT SHOULD BE REPORTED IMMEDIATELY:  *FEVER GREATER THAN 100.5 F  *CHILLS WITH OR WITHOUT FEVER  NAUSEA AND VOMITING THAT IS NOT CONTROLLED WITH YOUR NAUSEA MEDICATION  *UNUSUAL SHORTNESS OF BREATH  *UNUSUAL BRUISING OR BLEEDING  TENDERNESS IN MOUTH AND THROAT WITH OR WITHOUT PRESENCE OF ULCERS  *URINARY PROBLEMS  *BOWEL PROBLEMS  UNUSUAL RASH Items with * indicate a potential emergency and should be followed up as soon as possible.  Feel free to call the clinic should you have any questions or concerns. The clinic phone number is [phone].  Please show the [facility] at check-in to the Emergency Department and triage nurse.

## 2017-08-10 NOTE — Assessment & Plan Note (Addendum)
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 MRI 02/14/2015: T5 large destructive lesion with pathologic compression fracture and extensive epidural tumor, involvement of T4, excision metastatic carcinoma ER 60%, PR 0%, HER-2 positive ratio 2.71 average copy #7.45 03/27/2015: T4-6 decompression surgery for spinal cord compression PET-CT 12/10/17Rt para-spinal mass mildy hypermetabolic SUV 5.9, lytic cortical lesion 4.8 cm lesion left prox femur suv 8.8 favor osseus met disease Echo 04/12/2017 shows well preserved ejection fraction of 55-60%   Treatment summary: 1. Resumed anastrozole 05/21/2015, but it has caused significant hair loss so we switched her to exemestane 25 mg once daily 07/23/2015 2. Herceptin every 3 week started 04/10/2015 3. Bone metastases: Zometa every 3 months-next due late May/early June 4. Radiation therapy to the spine (Dr. [name]) 04/13/2015 to 05/19/2015 5. Radiation therapy to left femur 04/28/2016 to 05/12/2016  6. SRS to the brain lesion 11/24/2016 7.  Kadcyla started 05/05/2017 discontinued 07/27/2017 -------------------------------------------------------------------------------------------------------------------------- Current treatment: Palbociclib + Faslodex + trastuzumab + Pertuzumab started 08/10/2017  Toxicities of palbociclib:  Awaiting CBC.   She will proceed with Faslodex and Trastuzumab, Pertuzumab today.   F/u in 2 weeks with labs prior.

## 2017-08-10 NOTE — Telephone Encounter (Signed)
Gave patient AVs and calendar of upcoming April appointments.  °

## 2017-08-10 NOTE — Telephone Encounter (Signed)
Oral Chemotherapy Pharmacist Encounter   I saw patient today to follow up regarding patient's oral chemotherapy medication:  Ibrance (palbociclib) for the treatment of metastatic, hormone receptor positive breast cancer in conjunction with Faslodex, planned duration until disease progression or unacceptable toxicity.  Original Start date of oral chemotherapy: 07/27/2017  Pt is doing well today  Pt reports 0 doses of Ibrance 125 mg capsules, 1 capsule by mouth once daily taking with breakfast, for 3 weeks on, 1 week off, repeat every 4 weeks, missed in the last 2 weeks.   Pt reports the following side effects: Manageable increase in tiredness  Pertinent labs reviewed: ANC today is 1.1, platelet count has dropped to 81,000.  Discussed with MD, decision was made to decrease patient's dose to 100 mg capsule daily. No need to hold patient's dose per MD.  Just start patient on decreased dose as of tomorrow to finish out first cycle.  Patient will still take 1 week off.  She will then be seen back in the office for another lab check prior to the start of cycle 2.   Dispensed samples to patient:  Medication: Ibrance 100 mg capsules Instructions: Take 1 capsule by mouth once daily with breakfast.  Take for 3 weeks on, 1 week off, repeat every 4 weeks.  Patient only has 6 days left and current cycle before her next week off. Quantity dispensed: 21 Days supply: 28 Manufacturer: Pfizer Lot: [id number] Exp: 08/2018   Other Issues: Patient understands that we will wait to see what her white blood cells and platelets do on her next lab check prior to sending prescription for new dose to the pharmacy for next cycle.  Patient signed manufacturer assistance application for Pfizer to try to receive Ibrance at $0 out-of-pocket because from the manufacturer. Her co-pay for the medication is prohibitively expensive. Next fill of [facility] will be done on manufacturer free voucher. This will give [place] plenty of time  to get application to manufacturer assistance program and get with a response from the application. Application for manufacturer assistance will be updated in a separate encounter.  Patient knows to call the office with questions or concerns. Oral Oncology Clinic will continue to follow.  Thank you,  Johny Drilling, PharmD, BCPS, BCOP 08/10/2017 3:16 PM Oral Oncology Clinic [phone]

## 2017-08-11 ENCOUNTER — Ambulatory Visit: Payer: Medicare Other | Admitting: Physical Therapy

## 2017-08-14 ENCOUNTER — Ambulatory Visit: Payer: Medicare Other | Admitting: Physical Therapy

## 2017-08-14 ENCOUNTER — Encounter: Payer: Self-pay | Admitting: Physical Therapy

## 2017-08-14 DIAGNOSIS — M6281 Muscle weakness (generalized): Secondary | ICD-10-CM

## 2017-08-14 DIAGNOSIS — R42 Dizziness and giddiness: Secondary | ICD-10-CM

## 2017-08-14 DIAGNOSIS — R2681 Unsteadiness on feet: Secondary | ICD-10-CM | POA: Diagnosis not present

## 2017-08-14 DIAGNOSIS — H8111 Benign paroxysmal vertigo, right ear: Secondary | ICD-10-CM | POA: Diagnosis not present

## 2017-08-14 DIAGNOSIS — R2689 Other abnormalities of gait and mobility: Secondary | ICD-10-CM | POA: Diagnosis not present

## 2017-08-14 NOTE — Therapy (Signed)
[facility] [address] [place], [place], [address] Phone: [phone]   Fax:  [phone]  Physical Therapy Treatment  Patient Details  Name: Heather [name] MRN: [id number] Date of Birth: 31-Oct-1945 Referring Provider: [name], MD   Encounter Date: 08/14/2017  PT End of Session - 08/14/17 1448    Visit Number  9    Number of Visits  17    Date for PT Re-Evaluation  09/10/17    Authorization Type  Medicare, BCBS    PT Start Time  [phone]    PT Stop Time  0928    PT Time Calculation (min)  42 min    Activity Tolerance  Patient tolerated treatment well;No increased pain    Behavior During Therapy  WFL for tasks assessed/performed       Past Medical History:  Diagnosis Date  . Anemia    hx of  . Anxiety    r/t updated surgery  . Breast cancer ([place])    right  . Cataract    immature-not sure of which eye  . Complication of anesthesia    pt states she is sensitive to meds  . Dizziness    has been going on for 34month;medical MD aware  . Genetic testing 07/11/2016   Test Results: Normal; No pathogenic mutations detected  Genes Analyzed: 43 genes on [employer]'s Common Cancers panel (APC, ATM, AXIN2, BARD1, BMPR1A, BRCA1, BRCA2, BRIP1, CDH1, CDKN2A, CHEK2, DICER1, EPCAM, GREM1, HOXB13, KIT, MEN1, MLH1, MSH2, MSH6, MUTYH, NBN, NF1, PALB2, PDGFRA, PMS2, POLD1, POLE, PTEN, RAD50, RAD51C, RAD51D, SDHA, SDHB, SDHC, SDHD, SMAD4, SMARCA4, STK11, TP53, TSC1, TSC2, VHL).  .Marland KitchenGERD (gastroesophageal reflux disease)    Tums prn  . H. pylori infection   . H/O hiatal hernia   . Hemorrhoids   . History of UTI   . Hx of radiation therapy 10/25/12- 12/11/12   right chest wall/regional lymph nodes 5040 cGy, 28 sessions, right chest wall boost 1000 cGy 1 session  . Hx: UTI (urinary tract infection)   . Hyperlipidemia    but not on meds;diet and exercise controlled  . Hypertension    recently started Aldactone   . Insomnia    takes  Melatoniin daily  . Metastasis to spinal column (HCC)    T5, Left  Femur cancer- radiation   . Neuropathy    "from back surgery"  . PONV (postoperative nausea and vomiting)    pt experienced hair loss, confusion and combative- 02/15/15  . Right knee pain   . Right shoulder pain   . Skin cancer    squamous, Nodule to back - "same cancer as breast."  . Urinary frequency    d/t taking Aldactone    Past Surgical History:  Procedure Laterality Date  . COLONOSCOPY    . cyst removed from left breast  1970  . DECOMPRESSIVE LUMBAR LAMINECTOMY LEVEL 4 N/A 02/15/2015   Procedure: DECOMPRESSION T5 AND T3-T7 STABALIZATION;  Surgeon: NConsuella Lose MD;  [facility];  Service: Neurosurgery;  Laterality: N/A;  . ESOPHAGOGASTRODUODENOSCOPY    . LAMINECTOMY N/A 03/27/2015   Procedure: Thoracic Four-Thoracic Six Laminectomy for tumor;  Surgeon: NConsuella Lose MD;  [facility];  Service: Neurosurgery;  Laterality: N/A;  T4-T6 Laminectomy  . left wrist surgery   2004   with plate  . MASTECTOMY MODIFIED RADICAL  05/10/2012   Procedure: MASTECTOMY MODIFIED RADICAL;  Surgeon: PMerrie Roof MD;  [facility]  Service: General;  Laterality: Right;  RIGHT MODIFIED RADICAL MASTECTOMY  . MASTECTOMY, RADICAL Right   . MINOR BREAST BIOPSY Left 05/16/2016   Procedure: EXCISION OF BACK NODULE;  Surgeon: Autumn Messing III, MD;  Location: [facility];  Service: General;  Laterality: Left;  . Nodule  back  05/17/2015  . PORTACATH PLACEMENT  05/10/2012   Procedure: INSERTION PORT-A-CATH;  Surgeon: Merrie Roof, MD;  Location: [facility];  Service: General;  Laterality: Left;    There were no vitals filed for this visit.  Subjective Assessment - 08/14/17 0848    Subjective  Reports lower back was sore after last session when practiced reaching down with good spinal posture. It is still sore now.     Pertinent History  metastatic breast CA with mets to cerebellum, ribs, spine;  T4 paraplegia due to metastatic lesions, neuropathy, HTN, osteopenia, CKD, OA    Limitations  Walking    Patient Stated Goals  Get rid of the dizziness "if that is realistic"    Currently in Pain?  Yes    Pain Score  4     Pain Location  Foot    Pain Orientation  Left    Pain Descriptors / Indicators  Burning    Pain Type  Chronic pain    Pain Frequency  Constant    Pain Score  4    Pain Location  Back    Pain Orientation  Lower    Pain Descriptors / Indicators  Aching    Pain Type  Chronic pain                       [facility] Adult PT Treatment/Exercise - 08/14/17 0852      Exercises   Exercises  Lumbar      Lumbar Exercises: Stretches   Active Hamstring Stretch  Right;Left;1 rep;20 seconds supine; pt demonstrating home stretch; educated incr 30 sec    Single Knee to Chest Stretch  Right;Left;3 reps;30 seconds      Knee/Hip Exercises: Aerobic   Stepper  Sci-Fit L1.5 x  5 minutes     Shoulder Exercises: Supine   Flexion  AROM;Strengthening;Both;5 reps 2 sets; with broom handle; knees flexed for low back      Vestibular Treatment/Exercise - 08/14/17 1439      Vestibular Treatment/Exercise   Vestibular Treatment Provided  Gaze    Gaze Exercises  X1 Viewing Horizontal;X1 Viewing Vertical      X1 Viewing Horizontal   Foot Position  standing feet apart; feet together; no UE support    Time  -- 30-60 seconds    Reps  2 each position    Comments  denied symptoms with feet apart x 30 sec; mild symptoms with feet together and incr speed head turns      X1 Viewing Vertical   Foot Position  standing feet apart; feet together; no UE support    Time  -- 30 seconds    Reps  2 each position    Comments  denied symptoms            PT Education - 08/14/17 1447    Education Details  addition/update to HEP;     Person(s) Educated  Patient    Methods  Explanation;Demonstration;Handout    Comprehension  Verbalized understanding;Returned demonstration;Verbal cues  required;Need further instruction       PT Short Term Goals - 08/07/17 1335      PT SHORT TERM GOAL #1  Title  Pt will tolerate repositioning maneuvers for R posterior canal cupulolithiasis    Time  4    Period  Weeks    Status  Achieved      PT SHORT TERM GOAL #2   Title  Pt will participate in further gait and balance testing: gait velocity and FGA    Time  4    Period  Weeks    Status  Achieved      PT SHORT TERM GOAL #3   Title  Pt will initiate gaze adaptation and balance HEP with supervision    Time  4    Period  Weeks    Status  Achieved        PT Long Term Goals - 07/21/17 4008      PT LONG TERM GOAL #1   Title  Independent with vestibular and balance HEP    Time  8    Period  Weeks    Status  New    Target Date  09/10/17      PT LONG TERM GOAL #2   Title  Pt will demonstrate negative positional vestibular testing    Baseline  R posterior canal cupulolithiasis    Time  8    Period  Weeks    Status  New    Target Date  09/10/17      PT LONG TERM GOAL #3   Title  Pt will improve use of VOR as indicated by DVA 2-3 line difference    Baseline  deferred; pt demonstrated 0 line difference when assessed.    Status  Deferred      PT LONG TERM GOAL #4   Title  Pt will improve gait velocity to >/= 3.6 ft/sec with cane for increased safety during community gait    Baseline  3.07 ft/sec    Time  8    Period  Weeks    Status  Revised    Target Date  09/10/17      PT LONG TERM GOAL #5   Title  Pt will improve FGA by 4 points; using cane to perform as needed    Baseline  16/30    Time  8    Period  Weeks    Status  Revised    Target Date  09/10/17      PT LONG TERM GOAL #6   Title  Pt will improve overall function as indicated by FOTO to >/= 60%    Baseline  51% function    Time  8    Period  Weeks    Status  New            Plan - 08/14/17 1449    Clinical Impression Statement  Skilled session focused on updating HEP as appropriate. Patient  reported significant increased back pain after prior session when working on bending and reaching and requested to defer this activity today. Focused on gentle stretching of back in supine/supported position. Same position for gentle UE strengthening with goal to also engage core muscles. Patient asking appropriate questions and engaged in her program.     Rehab Potential  Fair    Clinical Impairments Affecting Rehab Potential  currently undergoing chemotherapy for metastatic cancer, sensory changes affecting balance and vestibular system    PT Frequency  2x / week    PT Duration  8 weeks    PT Treatment/Interventions  ADLs/Self Care Home Management;Canalith Repostioning;DME Instruction;Gait training;Functional mobility training;Therapeutic activities;Therapeutic exercise;Balance training;Neuromuscular re-education;Patient/family education;Vestibular  PT Next Visit Plan  gentle UE/LE strengthening HEP - light weights, avoid bending due to spine (try in supine with spine supported).  Practice reaching low cabinet > overhead keeping neutral spine (she tolerated poorly when done prior session & requested to defer)  Progress x 1 viewing and corner balance    PT Home Exercise Plan  HEP- standing hip flexion, abduction, chest stretch, heel/toe raise    Consulted and Agree with Plan of Care  Patient       Patient will benefit from skilled therapeutic intervention in order to improve the following deficits and impairments:  Decreased balance, Dizziness, Impaired sensation, Difficulty walking  Visit Diagnosis: Unsteadiness on feet  Dizziness and giddiness  Muscle weakness (generalized)     Problem List Patient Active Problem List   Diagnosis Date Noted  . Goals of care, counseling/discussion 08/10/2017  . Brain metastasis ([facility]) 11/18/2016  . Genetic testing 07/11/2016  . ALLERGY TO CHLORHEXADINE, USE BETADINE ONLY 06/23/2016  . Kyphosis of thoracic region 06/13/2016  . Plantar fasciitis,  bilateral 04/20/2016  . Neuropathic pain 01/27/2016  . Anemia of chronic disease 05/21/2015  . Metastatic cancer to spine ([facility]) 03/27/2015  . Thrombocytopenia ([facility]) 03/12/2015  . Prerenal azotemia 03/12/2015  . Pain   . Recurrent UTI--due to Klebsiella   . Paraplegia at T4 level ([place]) 02/20/2015  . Neurogenic bowel 02/20/2015  . Neurogenic bladder 02/20/2015  . Postoperative anemia due to acute blood loss 02/17/2015  . Spinal cord compression due to malignant neoplasm metastatic to spine ([facility]) 02/15/2015  . Epidural mass 02/15/2015  . Thoracic spine tumor   . Pathologic fracture of vertebra 02/14/2015  . Pathologic fracture of thoracic vertebrae 02/14/2015  . Breast cancer metastasized to bone, right ([facility]) 02/14/2015  . Hyperlipidemia 02/14/2015  . H. pylori infection   . HTN (hypertension) 04/17/2012  . Primary cancer of upper outer quadrant of right female breast ([facility] [place]) 03/08/2012    Rexanne Mano, PT 08/14/2017, 4:00 PM  [facility] [address] [place], [place], [address] Phone: [phone]   Fax:  [phone]  Name: Heather [name] MRN: [id number] Date of Birth: February 19, 1946

## 2017-08-14 NOTE — Patient Instructions (Signed)
Gaze Stabilization: Tip Card  1.Target must remain in focus, not blurry, and appear stationary while head is in motion. 2.Perform exercises with small head movements (45 to either side of midline). 3.Increase speed of head motion so long as target is in focus. 4.If you wear eyeglasses, be sure you can see target through lens (therapist will give specific instructions for bifocal / progressive lenses). 5.These exercises may provoke dizziness or nausea. Work through these symptoms. If too dizzy, slow head movement slightly. Rest between each exercise. 6.Exercises demand concentration; avoid distractions. 7.For safety, perform standing exercises close to a counter, wall, corner, or next to someone.  Copyright  VHI. All rights reserved.    Gaze Stabilization: Standing Feet Together    Feet together, keeping eyes on target on wall __3__ feet away, tilt head down 15-30 and move head side to side for _30___ seconds. Be sure to turn your head quickly (2 turns per second). Repeat a total of 3 times. Repeat while moving head up and down for __30__ seconds. Repeat a total of # times.  Do _2-3___ sessions per day. Repeat using target on pattern background.  Copyright  VHI. All rights reserved.     Special Instructions: Exercises may bring on mild to moderate symptoms of swimmy headedness that resolve within 30 minutes of completing exercises. If symptoms are lasting longer than 30 minutes, modify your exercises by:  >decreasing the # of times you complete each activity >ensuring your symptoms return to baseline before moving onto the next exercise >dividing up exercises so you do not do them all in one session, but multiple short sessions throughout the day >doing them once a day until symptoms improve   Knee to Chest (Flexion)    Pull knee toward chest. Feel stretch in lower back or buttock area. Breathing deeply, Hold __30__ seconds. Repeat with other knee. Repeat __3__ times. Do  __1-2__ sessions per day   [email or web address].exer.[email or web address]   Copyright  VHI. All rights reserved.

## 2017-08-16 ENCOUNTER — Encounter: Payer: Self-pay | Admitting: Physical Therapy

## 2017-08-16 ENCOUNTER — Ambulatory Visit: Payer: Medicare Other | Admitting: Physical Therapy

## 2017-08-16 DIAGNOSIS — H8111 Benign paroxysmal vertigo, right ear: Secondary | ICD-10-CM | POA: Diagnosis not present

## 2017-08-16 DIAGNOSIS — R2681 Unsteadiness on feet: Secondary | ICD-10-CM | POA: Diagnosis not present

## 2017-08-16 DIAGNOSIS — R42 Dizziness and giddiness: Secondary | ICD-10-CM

## 2017-08-16 DIAGNOSIS — M6281 Muscle weakness (generalized): Secondary | ICD-10-CM | POA: Diagnosis not present

## 2017-08-16 DIAGNOSIS — R2689 Other abnormalities of gait and mobility: Secondary | ICD-10-CM | POA: Diagnosis not present

## 2017-08-16 NOTE — Therapy (Signed)
[facility] [address] [place], [place], [address] Phone: [phone]   Fax:  [phone]  Physical Therapy Treatment  Patient Details  Name: Heather [name] MRN: [id number] Date of Birth: 05-12-1945 Referring Provider: [name], MD   Encounter Date: 08/16/2017  PT End of Session - 08/16/17 1349    Visit Number  10    Number of Visits  17    Date for PT Re-Evaluation  09/10/17    Authorization Type  Medicare, BCBS    PT Start Time  [phone]    PT Stop Time  0932    PT Time Calculation (min)  45 min    Activity Tolerance  Patient tolerated treatment well;No increased pain    Behavior During Therapy  WFL for tasks assessed/performed       Past Medical History:  Diagnosis Date  . Anemia    hx of  . Anxiety    r/t updated surgery  . Breast cancer ([facility])    right  . Cataract    immature-not sure of which eye  . Complication of anesthesia    pt states she is sensitive to meds  . Dizziness    has been going on for 65month;medical MD aware  . Genetic testing 07/11/2016   Test Results: Normal; No pathogenic mutations detected  Genes Analyzed: 43 genes on [employer]'s Common Cancers panel (APC, ATM, AXIN2, BARD1, BMPR1A, BRCA1, BRCA2, BRIP1, CDH1, CDKN2A, CHEK2, DICER1, EPCAM, GREM1, HOXB13, KIT, MEN1, MLH1, MSH2, MSH6, MUTYH, NBN, NF1, PALB2, PDGFRA, PMS2, POLD1, POLE, PTEN, RAD50, RAD51C, RAD51D, SDHA, SDHB, SDHC, SDHD, SMAD4, SMARCA4, STK11, TP53, TSC1, TSC2, VHL).  .Marland KitchenGERD (gastroesophageal reflux disease)    Tums prn  . H. pylori infection   . H/O hiatal hernia   . Hemorrhoids   . History of UTI   . Hx of radiation therapy 10/25/12- 12/11/12   right chest wall/regional lymph nodes 5040 cGy, 28 sessions, right chest wall boost 1000 cGy 1 session  . Hx: UTI (urinary tract infection)   . Hyperlipidemia    but not on meds;diet and exercise controlled  . Hypertension    recently started Aldactone   . Insomnia    takes  Melatoniin daily  . Metastasis to spinal column (HCC)    T5, Left  Femur cancer- radiation   . Neuropathy    "from back surgery"  . PONV (postoperative nausea and vomiting)    pt experienced hair loss, confusion and combative- 02/15/15  . Right knee pain   . Right shoulder pain   . Skin cancer    squamous, Nodule to back - "same cancer as breast."  . Urinary frequency    d/t taking Aldactone    Past Surgical History:  Procedure Laterality Date  . COLONOSCOPY    . cyst removed from left breast  1970  . DECOMPRESSIVE LUMBAR LAMINECTOMY LEVEL 4 N/A 02/15/2015   Procedure: DECOMPRESSION T5 AND T3-T7 STABALIZATION;  Surgeon: NConsuella Lose MD;  [facility];  Service: Neurosurgery;  Laterality: N/A;  . ESOPHAGOGASTRODUODENOSCOPY    . LAMINECTOMY N/A 03/27/2015   Procedure: Thoracic Four-Thoracic Six Laminectomy for tumor;  Surgeon: NConsuella Lose MD;  [facility];  Service: Neurosurgery;  Laterality: N/A;  T4-T6 Laminectomy  . left wrist surgery   2004   with plate  . MASTECTOMY MODIFIED RADICAL  05/10/2012   Procedure: MASTECTOMY MODIFIED RADICAL;  Surgeon: PMerrie Roof MD;  [facility]  Service: General;  Laterality: Right;  RIGHT MODIFIED RADICAL MASTECTOMY  . MASTECTOMY, RADICAL Right   . MINOR BREAST BIOPSY Left 05/16/2016   Procedure: EXCISION OF BACK NODULE;  Surgeon: Autumn Messing III, MD;  Location: [facility];  Service: General;  Laterality: Left;  . Nodule  back  05/17/2015  . PORTACATH PLACEMENT  05/10/2012   Procedure: INSERTION PORT-A-CATH;  Surgeon: Merrie Roof, MD;  Location: [facility];  Service: General;  Laterality: Left;    There were no vitals filed for this visit.  Subjective Assessment - 08/16/17 0854    Subjective  Reports low back soreness continues to improve. Developed Rt upper arm soreness from last session--?related to supine shoulder flexion. Over the weekend noticed her bil hand grip has weakened and bil hands  are turning numb. (She inquired re: treatments to correct neuropathy). Had a nose bleed this morning and took "a little longer than normal" to stop the bleeding. Noticed on inner left heel a raised area and purple discoloration of the skin. Concerned it could be a blood clot.     Pertinent History  metastatic breast CA with mets to cerebellum, ribs, spine; T4 paraplegia due to metastatic lesions, neuropathy, HTN, osteopenia, CKD, OA    Limitations  Walking    Patient Stated Goals  Get rid of the dizziness "if that is realistic"    Currently in Pain?  No/denies    Pain Score  4     Pain Location  Foot    Pain Orientation  Left    Pain Descriptors / Indicators  Burning    Pain Type  Chronic pain;Neuropathic pain    Pain Onset  More than a month ago    Pain Frequency  Constant    Pain Relieving Factors  Wears compression hose, medication                       [facility] Adult PT Treatment/Exercise - 08/16/17 0001      Exercises   Exercises  Other Exercises    Other Exercises   Discussed possible contraindication to exercise as platelets may now be below 50K (6 days ago had dropped to 81K and has had 6 more days of the medication that has been dropping her platelets). Educated on risks of exercise including hemarthrosis or other internal bleeding/hematoma. Due to nose bleed and ? small hematoma on inside left heel, recommended patient call her doctor and she if they want to get labs again before Thurs 4/18. Discussed the lack of evidence for any PT modalities or treatments for neuropathic hand pain and encouraged her to share this change in sensation with her MD>       Vestibular Treatment/Exercise - 08/16/17 1325      Vestibular Treatment/Exercise   Vestibular Treatment Provided  Gaze    Gaze Exercises  Eye/Head Exercise Horizontal      Eye/Head Exercise Horizontal   Foot Position  seated; corrective saccades with targets 10-12" apart    Time  -- 30 sec x 2 with slow head turns  due to slight blurring     Reps  2    Comments  completes slowly with target becoming blurry when she shifts her eyes. Instructed to hold eyes on target until it becomes clear and THEN turn her head toward the target            PT Education - 08/16/17 1335    Education Details  Discussed effects and potential risks with  low platelets and exercise. Recommend if she gets labs drawn and her platelets are <50K to hold PT until they increase above 50K. Educated in compensatory saccade exercise in sitting (forgot to give handout)       PT Short Term Goals - 08/07/17 1335      PT SHORT TERM GOAL #1   Title  Pt will tolerate repositioning maneuvers for R posterior canal cupulolithiasis    Time  4    Period  Weeks    Status  Achieved      PT SHORT TERM GOAL #2   Title  Pt will participate in further gait and balance testing: gait velocity and FGA    Time  4    Period  Weeks    Status  Achieved      PT SHORT TERM GOAL #3   Title  Pt will initiate gaze adaptation and balance HEP with supervision    Time  4    Period  Weeks    Status  Achieved        PT Long Term Goals - 07/21/17 5732      PT LONG TERM GOAL #1   Title  Independent with vestibular and balance HEP    Time  8    Period  Weeks    Status  New    Target Date  09/10/17      PT LONG TERM GOAL #2   Title  Pt will demonstrate negative positional vestibular testing    Baseline  R posterior canal cupulolithiasis    Time  8    Period  Weeks    Status  New    Target Date  09/10/17      PT LONG TERM GOAL #3   Title  Pt will improve use of VOR as indicated by DVA 2-3 line difference    Baseline  deferred; pt demonstrated 0 line difference when assessed.    Status  Deferred      PT LONG TERM GOAL #4   Title  Pt will improve gait velocity to >/= 3.6 ft/sec with cane for increased safety during community gait    Baseline  3.07 ft/sec    Time  8    Period  Weeks    Status  Revised    Target Date  09/10/17      PT  LONG TERM GOAL #5   Title  Pt will improve FGA by 4 points; using cane to perform as needed    Baseline  16/30    Time  8    Period  Weeks    Status  Revised    Target Date  09/10/17      PT LONG TERM GOAL #6   Title  Pt will improve overall function as indicated by FOTO to >/= 60%    Baseline  51% function    Time  8    Period  Weeks    Status  New            Plan - 08/16/17 1343    Clinical Impression Statement  Session focused on educating patient in reason for caution with exercise today due to her low platelet count. Encouraged her to contact her MD to see if new lab work can be done today so there will be a record of how low her platelets became during medical treatment. Patient currently has 7 day break from the medicine during which time the doctor said her "numbers should get better."  Rehab Potential  Fair    Clinical Impairments Affecting Rehab Potential  currently undergoing chemotherapy for metastatic cancer, sensory changes affecting balance and vestibular system    PT Treatment/Interventions  ADLs/Self Care Home Management;Canalith Repostioning;DME Instruction;Gait training;Functional mobility training;Therapeutic activities;Therapeutic exercise;Balance training;Neuromuscular re-education;Patient/family education;Vestibular    PT Next Visit Plan  See if lab work available to show platelet level. see if she recalled compensatory saccade exercise (forgot to give her a handout--please do!; ?revert to isometric exercises for decr strain on muscles if platelets remain low    PT Home Exercise Plan  HEP- standing hip flexion, abduction, chest stretch, heel/toe raise    Consulted and Agree with Plan of Care  Patient       Patient will benefit from skilled therapeutic intervention in order to improve the following deficits and impairments:  Decreased balance, Dizziness, Impaired sensation, Difficulty walking  Visit Diagnosis: Unsteadiness on feet  Dizziness and  giddiness     Problem List Patient Active Problem List   Diagnosis Date Noted  . Goals of care, counseling/discussion 08/10/2017  . Brain metastasis ([facility]) 11/18/2016  . Genetic testing 07/11/2016  . ALLERGY TO CHLORHEXADINE, USE BETADINE ONLY 06/23/2016  . Kyphosis of thoracic region 06/13/2016  . Plantar fasciitis, bilateral 04/20/2016  . Neuropathic pain 01/27/2016  . Anemia of chronic disease 05/21/2015  . Metastatic cancer to spine ([facility]) 03/27/2015  . Thrombocytopenia ([facility]) 03/12/2015  . Prerenal azotemia 03/12/2015  . Pain   . Recurrent UTI--due to Klebsiella   . Paraplegia at T4 level ([facility]) 02/20/2015  . Neurogenic bowel 02/20/2015  . Neurogenic bladder 02/20/2015  . Postoperative anemia due to acute blood loss 02/17/2015  . Spinal cord compression due to malignant neoplasm metastatic to spine ([facility]) 02/15/2015  . Epidural mass 02/15/2015  . Thoracic spine tumor   . Pathologic fracture of vertebra 02/14/2015  . Pathologic fracture of thoracic vertebrae 02/14/2015  . Breast cancer metastasized to bone, right ([facility]) 02/14/2015  . Hyperlipidemia 02/14/2015  . H. pylori infection   . HTN (hypertension) 04/17/2012  . Primary cancer of upper outer quadrant of right female breast ([place]) 03/08/2012    Rexanne Mano, PT 08/16/2017, 1:52 PM  [facility] [address] [place], [place], [address] Phone: [phone]   Fax:  [phone]  Name: Heather [name] MRN: [id number] Date of Birth: [birth year]

## 2017-08-18 ENCOUNTER — Telehealth: Payer: Self-pay | Admitting: Pharmacy Technician

## 2017-08-18 NOTE — Telephone Encounter (Signed)
Oral Oncology Patient Advocate Encounter  Was successful in securing patient an $ 5500 grant from Patient [place] [facility]) to provide copayment coverage for her Ibrance.  This will keep the out of pocket expense at $0.    I left a voicemail for the patient.  The billing information is as follows and has been shared with [place].   Member ID: [id number] Group ID: [id number] RxBin: [id number] Dates of Eligibility: 05/20/2017 through 08/18/2018  Fabio Asa. Melynda Keller, [place] Patient [place] [phone] 08/18/2017 2:59 PM

## 2017-08-22 ENCOUNTER — Telehealth: Payer: Self-pay

## 2017-08-22 NOTE — Telephone Encounter (Signed)
Received message from patient regarding experiencing severe fatigue, nose bleeds, and slight blood in urine on current Ibrance dosage. Per [name] I informed pt he wants her to take a break from the [facility]. Do not restart on Thursday, 18th of April. He will see her on May 2nd and discuss lowering the dosage. She is to still come in for labs and faslodex injections. No further questions at this time.  Cyndia Bent RN

## 2017-08-23 ENCOUNTER — Ambulatory Visit: Payer: Medicare Other | Admitting: Physical Therapy

## 2017-08-24 ENCOUNTER — Other Ambulatory Visit: Payer: Self-pay | Admitting: *Deleted

## 2017-08-24 ENCOUNTER — Inpatient Hospital Stay: Payer: Medicare Other

## 2017-08-24 ENCOUNTER — Ambulatory Visit: Payer: Medicare Other | Admitting: Physical Therapy

## 2017-08-24 DIAGNOSIS — C7951 Secondary malignant neoplasm of bone: Secondary | ICD-10-CM

## 2017-08-24 DIAGNOSIS — Z923 Personal history of irradiation: Secondary | ICD-10-CM | POA: Diagnosis not present

## 2017-08-24 DIAGNOSIS — C50411 Malignant neoplasm of upper-outer quadrant of right female breast: Secondary | ICD-10-CM | POA: Diagnosis not present

## 2017-08-24 DIAGNOSIS — Z5112 Encounter for antineoplastic immunotherapy: Secondary | ICD-10-CM | POA: Diagnosis not present

## 2017-08-24 DIAGNOSIS — C7931 Secondary malignant neoplasm of brain: Secondary | ICD-10-CM | POA: Diagnosis not present

## 2017-08-24 DIAGNOSIS — Z9221 Personal history of antineoplastic chemotherapy: Secondary | ICD-10-CM | POA: Diagnosis not present

## 2017-08-24 LAB — CBC WITH DIFFERENTIAL (CANCER CENTER ONLY)
Basophils Absolute: 0.1 10*3/uL (ref 0.0–0.1)
Basophils Relative: 5 %
Eosinophils Absolute: 0 10*3/uL (ref 0.0–0.5)
Eosinophils Relative: 1 %
HCT: 33.7 % — ABNORMAL LOW (ref 34.8–46.6)
Hemoglobin: 11.2 g/dL — ABNORMAL LOW (ref 11.6–15.9)
Lymphocytes Relative: 29 %
Lymphs Abs: 0.5 10*3/uL — ABNORMAL LOW (ref 0.9–3.3)
MCH: 28.1 pg (ref 25.1–34.0)
MCHC: 33.2 g/dL (ref 31.5–36.0)
MCV: 84.7 fL (ref 79.5–101.0)
Monocytes Absolute: 0.3 10*3/uL (ref 0.1–0.9)
Monocytes Relative: 19 %
Neutro Abs: 0.8 10*3/uL — ABNORMAL LOW (ref 1.5–6.5)
Neutrophils Relative %: 46 %
Platelet Count: 106 10*3/uL — ABNORMAL LOW (ref 145–400)
RBC: 3.98 MIL/uL (ref 3.70–5.45)
RDW: 18.8 % — ABNORMAL HIGH (ref 11.2–14.5)
WBC Count: 1.7 10*3/uL — ABNORMAL LOW (ref 3.9–10.3)

## 2017-08-24 LAB — BASIC METABOLIC PANEL
Anion gap: 7 (ref 3–11)
BUN: 13 mg/dL (ref 7–26)
CO2: 27 mmol/L (ref 22–29)
Calcium: 10 mg/dL (ref 8.4–10.4)
Chloride: 104 mmol/L (ref 98–109)
Creatinine, Ser: 0.82 mg/dL (ref 0.60–1.10)
GFR calc Af Amer: >60 mL/min (ref >60)
GFR calc non Af Amer: >60 mL/min (ref >60)
Glucose, Bld: 99 mg/dL (ref 70–140)
Potassium: 3.9 mmol/L (ref 3.5–5.1)
Sodium: 138 mmol/L (ref 136–145)

## 2017-08-24 MED ORDER — SODIUM CHLORIDE 0.9 % IJ SOLN
10.0000 mL | INTRAMUSCULAR | Status: DC | PRN
  Administered 2017-08-24: 10 mL via INTRAVENOUS
  Filled 2017-08-24: qty 10

## 2017-08-24 MED ORDER — FULVESTRANT 250 MG/5ML IM SOLN
INTRAMUSCULAR | Status: AC
  Filled 2017-08-24: qty 10

## 2017-08-24 MED ORDER — FULVESTRANT 250 MG/5ML IM SOLN
500.0000 mg | Freq: Once | INTRAMUSCULAR | Status: AC
  Administered 2017-08-24: 500 mg via INTRAMUSCULAR

## 2017-08-24 MED ORDER — HEPARIN SOD (PORK) LOCK FLUSH 100 UNIT/ML IV SOLN
500.0000 [IU] | Freq: Once | INTRAVENOUS | Status: AC | PRN
  Administered 2017-08-24: 500 [IU] via INTRAVENOUS
  Filled 2017-08-24: qty 5

## 2017-08-24 NOTE — Patient Instructions (Signed)

## 2017-08-28 ENCOUNTER — Ambulatory Visit: Payer: Medicare Other | Admitting: Physical Therapy

## 2017-08-31 ENCOUNTER — Ambulatory Visit: Payer: Medicare Other | Admitting: Physical Therapy

## 2017-08-31 ENCOUNTER — Encounter: Payer: Self-pay | Admitting: Physical Therapy

## 2017-08-31 DIAGNOSIS — H8111 Benign paroxysmal vertigo, right ear: Secondary | ICD-10-CM

## 2017-08-31 DIAGNOSIS — R2689 Other abnormalities of gait and mobility: Secondary | ICD-10-CM | POA: Diagnosis not present

## 2017-08-31 DIAGNOSIS — R42 Dizziness and giddiness: Secondary | ICD-10-CM

## 2017-08-31 DIAGNOSIS — R2681 Unsteadiness on feet: Secondary | ICD-10-CM | POA: Diagnosis not present

## 2017-08-31 DIAGNOSIS — M6281 Muscle weakness (generalized): Secondary | ICD-10-CM | POA: Diagnosis not present

## 2017-08-31 NOTE — Therapy (Signed)
[facility] [address] [place], [place], [address] Phone: [phone]   Fax:  [phone]  Physical Therapy Treatment  Patient Details  Name: Heather [name] MRN: [id number] Date of Birth: March 21, 1946 Referring Provider: [name], MD   Encounter Date: 08/31/2017  PT End of Session - 08/31/17 1235    Visit Number  11    Number of Visits  17    Date for PT Re-Evaluation  09/10/17    Authorization Type  Medicare, BCBS    PT Start Time  0848    PT Stop Time  0934    PT Time Calculation (min)  46 min    Activity Tolerance  Patient tolerated treatment well    Behavior During Therapy  [facility] for tasks assessed/performed       Past Medical History:  Diagnosis Date  . Anemia    hx of  . Anxiety    r/t updated surgery  . Breast cancer ([place])    right  . Cataract    immature-not sure of which eye  . Complication of anesthesia    pt states she is sensitive to meds  . Dizziness    has been going on for 42month;medical MD aware  . Genetic testing 07/11/2016   Test Results: Normal; No pathogenic mutations detected  Genes Analyzed: 43 genes on [employer]'s Common Cancers panel (APC, ATM, AXIN2, BARD1, BMPR1A, BRCA1, BRCA2, BRIP1, CDH1, CDKN2A, CHEK2, DICER1, EPCAM, GREM1, HOXB13, KIT, MEN1, MLH1, MSH2, MSH6, MUTYH, NBN, NF1, PALB2, PDGFRA, PMS2, POLD1, POLE, PTEN, RAD50, RAD51C, RAD51D, SDHA, SDHB, SDHC, SDHD, SMAD4, SMARCA4, STK11, TP53, TSC1, TSC2, VHL).  .Marland KitchenGERD (gastroesophageal reflux disease)    Tums prn  . H. pylori infection   . H/O hiatal hernia   . Hemorrhoids   . History of UTI   . Hx of radiation therapy 10/25/12- 12/11/12   right chest wall/regional lymph nodes 5040 cGy, 28 sessions, right chest wall boost 1000 cGy 1 session  . Hx: UTI (urinary tract infection)   . Hyperlipidemia    but not on meds;diet and exercise controlled  . Hypertension    recently started Aldactone   . Insomnia    takes Melatoniin daily   . Metastasis to spinal column (HCC)    T5, Left  Femur cancer- radiation   . Neuropathy    "from back surgery"  . PONV (postoperative nausea and vomiting)    pt experienced hair loss, confusion and combative- 02/15/15  . Right knee pain   . Right shoulder pain   . Skin cancer    squamous, Nodule to back - "same cancer as breast."  . Urinary frequency    d/t taking Aldactone    Past Surgical History:  Procedure Laterality Date  . COLONOSCOPY    . cyst removed from left breast  1970  . DECOMPRESSIVE LUMBAR LAMINECTOMY LEVEL 4 N/A 02/15/2015   Procedure: DECOMPRESSION T5 AND T3-T7 STABALIZATION;  Surgeon: NConsuella Lose MD;  [facility];  Service: Neurosurgery;  Laterality: N/A;  . ESOPHAGOGASTRODUODENOSCOPY    . LAMINECTOMY N/A 03/27/2015   Procedure: Thoracic Four-Thoracic Six Laminectomy for tumor;  Surgeon: NConsuella Lose MD;  [facility];  Service: Neurosurgery;  Laterality: N/A;  T4-T6 Laminectomy  . left wrist surgery   2004   with plate  . MASTECTOMY MODIFIED RADICAL  05/10/2012   Procedure: MASTECTOMY MODIFIED RADICAL;  Surgeon: PMerrie Roof MD;  [facility]  Service: General;  Laterality:  Right;  RIGHT MODIFIED RADICAL MASTECTOMY  . MASTECTOMY, RADICAL Right   . MINOR BREAST BIOPSY Left 05/16/2016   Procedure: EXCISION OF BACK NODULE;  Surgeon: Autumn Messing III, MD;  Location: [facility];  Service: General;  Laterality: Left;  . Nodule  back  05/17/2015  . PORTACATH PLACEMENT  05/10/2012   Procedure: INSERTION PORT-A-CATH;  Surgeon: Merrie Roof, MD;  Location: [facility];  Service: General;  Laterality: Left;    There were no vitals filed for this visit.  Subjective Assessment - 08/31/17 0901    Subjective  "Today is my last visit isn't it?".  Platelets have increased to >100 and pt reports improvement in energy; no further nose bleeds.  Questions about effectiveness of balance exercises and low back tension when performing.     Pertinent History  metastatic breast CA with mets to cerebellum, ribs, spine; T4 paraplegia due to metastatic lesions, neuropathy, HTN, osteopenia, CKD, OA    Limitations  Walking    Patient Stated Goals  Get rid of the dizziness "if that is realistic"    Currently in Pain?  No/denies    Pain Onset  More than a month ago         [facility] PT Assessment - 08/31/17 0907      Standardized Balance Assessment   Standardized Balance Assessment  10 meter walk test    10 Meter Walk  9.50 seconds 3.4 ft/sec      Functional Gait  Assessment   Gait Level Surface  Walks 20 ft in less than 7 sec but greater than 5.5 sec, uses assistive device, slower speed, mild gait deviations, or deviates 6-10 in outside of the 12 in walkway width.    Change in Gait Speed  Able to change speed, demonstrates mild gait deviations, deviates 6-10 in outside of the 12 in walkway width, or no gait deviations, unable to achieve a major change in velocity, or uses a change in velocity, or uses an assistive device.    Gait with Horizontal Head Turns  Performs head turns smoothly with slight change in gait velocity (eg, minor disruption to smooth gait path), deviates 6-10 in outside 12 in walkway width, or uses an assistive device.    Gait with Vertical Head Turns  Performs task with slight change in gait velocity (eg, minor disruption to smooth gait path), deviates 6 - 10 in outside 12 in walkway width or uses assistive device    Gait and Pivot Turn  Pivot turns safely within 3 sec and stops quickly with no loss of balance.    Step Over Obstacle  Is able to step over one shoe box (4.5 in total height) without changing gait speed. No evidence of imbalance.    Gait with Narrow Base of Support  Is able to ambulate for 10 steps heel to toe with no staggering.    Gait with Eyes Closed  Walks 20 ft, slow speed, abnormal gait pattern, evidence for imbalance, deviates 10-15 in outside 12 in walkway width. Requires more than 9 sec to ambulate  20 ft.    Ambulating Backwards  Walks 20 ft, uses assistive device, slower speed, mild gait deviations, deviates 6-10 in outside 12 in walkway width.    Steps  Alternating feet, must use rail.    Total Score  21    FGA comment:  21/30         Vestibular Assessment - 08/31/17 0918      Dix-Hallpike Right  Dix-Hallpike Right Duration  > 1 minute    Dix-Hallpike Right Symptoms  Upbeat, right rotatory nystagmus      Dix-Hallpike Left   Dix-Hallpike Left Duration  0    Dix-Hallpike Left Symptoms  No nystagmus                Vestibular Treatment/Exercise - 08/31/17 6256      Vestibular Treatment/Exercise   Vestibular Treatment Provided  Canalith Repositioning    Canalith Repositioning  Epley Manuever Right;Comment       EPLEY MANUEVER RIGHT   Number of Reps   1    Overall Response  Improved Symptoms    Response Details   after use of vibration and head turns converted to canalithiasis.  Mild lightheadedness when sitting upright.            PT Education - 08/31/17 1234    Education provided  Yes    Education Details  progress towards goals, plan for next session, hold habituation/Brandt-Daroff x 2 days and resume if symptoms return    Person(s) Educated  Patient    Methods  Explanation    Comprehension  Verbalized understanding       PT Short Term Goals - 08/07/17 1335      PT SHORT TERM GOAL #1   Title  Pt will tolerate repositioning maneuvers for R posterior canal cupulolithiasis    Time  4    Period  Weeks    Status  Achieved      PT SHORT TERM GOAL #2   Title  Pt will participate in further gait and balance testing: gait velocity and FGA    Time  4    Period  Weeks    Status  Achieved      PT SHORT TERM GOAL #3   Title  Pt will initiate gaze adaptation and balance HEP with supervision    Time  4    Period  Weeks    Status  Achieved        PT Long Term Goals - 08/31/17 1236      PT LONG TERM GOAL #1   Title  Independent with vestibular  and balance HEP    Time  8    Period  Weeks    Status  On-going    Target Date  09/10/17      PT LONG TERM GOAL #2   Title  Pt will demonstrate negative positional vestibular testing    Baseline  R posterior canal cupulolithiasis > converted to canalithiasis    Time  8    Period  Weeks    Status  On-going    Target Date  09/10/17      PT LONG TERM GOAL #3   Title  Pt will improve use of VOR as indicated by DVA 2-3 line difference    Baseline  deferred; pt demonstrated 0 line difference when assessed.    Status  Deferred      PT LONG TERM GOAL #4   Title  Pt will improve gait velocity to >/= 3.6 ft/sec with cane for increased safety during community gait    Baseline  3.07 ft/sec > 3.4 ft/sec improved but not to goal    Status  Not Met      PT LONG TERM GOAL #5   Title  Pt will improve FGA by 4 points; using cane to perform as needed    Baseline  16/30 > 21/30    Status  Achieved  PT LONG TERM GOAL #6   Title  Pt will improve overall function as indicated by FOTO to >/= 60%    Baseline  51% function    Time  8    Period  Weeks    Status  On-going    Target Date  09/10/17            Plan - 08/31/17 1238    Clinical Impression Statement  Platelet count had increased to >100 so PT proceeded with treatment to patient tolerance.  Initiated assessment of LTG with pt demonstrating improvement in gait velocity (but not to goal level) and balance during gait as indicated by 5 point increase in FGA score.  When performing positional testing pt was positive again for R posterior canal cupulolithiasis which converted to canalithiasis with repetitive head turns and vibration before performing CRM.  One more visit added to re-assess R posterior canal and treat if needed and to review final HEP adding UE strengthening prior to D/C.Marland Kitchen    Rehab Potential  Fair    Clinical Impairments Affecting Rehab Potential  currently undergoing chemotherapy for metastatic cancer, sensory changes  affecting balance and vestibular system    PT Treatment/Interventions  ADLs/Self Care Home Management;Canalith Repostioning;DME Instruction;Gait training;Functional mobility training;Therapeutic activities;Therapeutic exercise;Balance training;Neuromuscular re-education;Patient/family education;Vestibular    PT Next Visit Plan  platelet levels still up >100??  final session: D/C FOTO, check R posterior canal for cupulolithiasis -if + treat with repetitive head turns and vibration before CRM.  Review balance and gaze adaptation HEP and focus on pt being more aware of where COG is and core activation (vs. low back strain), add light UE strengthening to HEP.  Send to me for D/C.      PT Home Exercise Plan  HEP- standing hip flexion, abduction, chest stretch, heel/toe raise    Consulted and Agree with Plan of Care  Patient       Patient will benefit from skilled therapeutic intervention in order to improve the following deficits and impairments:  Decreased balance, Dizziness, Impaired sensation, Difficulty walking  Visit Diagnosis: BPPV (benign paroxysmal positional vertigo), right  Unsteadiness on feet  Dizziness and giddiness  Other abnormalities of gait and mobility     Problem List Patient Active Problem List   Diagnosis Date Noted  . Goals of care, counseling/discussion 08/10/2017  . Brain metastasis ([place]) 11/18/2016  . Genetic testing 07/11/2016  . ALLERGY TO CHLORHEXADINE, USE BETADINE ONLY 06/23/2016  . Kyphosis of thoracic region 06/13/2016  . Plantar fasciitis, bilateral 04/20/2016  . Neuropathic pain 01/27/2016  . Anemia of chronic disease 05/21/2015  . Metastatic cancer to spine ([place]) 03/27/2015  . Thrombocytopenia ([place]) 03/12/2015  . Prerenal azotemia 03/12/2015  . Pain   . Recurrent UTI--due to Klebsiella   . Paraplegia at T4 level ([place]) 02/20/2015  . Neurogenic bowel 02/20/2015  . Neurogenic bladder 02/20/2015  . Postoperative anemia due to acute blood loss  02/17/2015  . Spinal cord compression due to malignant neoplasm metastatic to spine ([place]) 02/15/2015  . Epidural mass 02/15/2015  . Thoracic spine tumor   . Pathologic fracture of vertebra 02/14/2015  . Pathologic fracture of thoracic vertebrae 02/14/2015  . Breast cancer metastasized to bone, right ([place]) 02/14/2015  . Hyperlipidemia 02/14/2015  . H. pylori infection   . HTN (hypertension) 04/17/2012  . Primary cancer of upper outer quadrant of right female breast ([place] [place] [facility]) 03/08/2012   Rico Junker, PT, DPT 08/31/17    12:47 PM    [place]  [facility] [address] [place], [place], [address] Phone: [phone]   Fax:  [phone]  Name: Heather [name] MRN: [id number] Date of Birth: 04/04/1946

## 2017-09-05 ENCOUNTER — Ambulatory Visit: Payer: Medicare Other | Admitting: Physical Therapy

## 2017-09-05 ENCOUNTER — Encounter: Payer: Self-pay | Admitting: Physical Therapy

## 2017-09-05 DIAGNOSIS — M6281 Muscle weakness (generalized): Secondary | ICD-10-CM | POA: Diagnosis not present

## 2017-09-05 DIAGNOSIS — H8111 Benign paroxysmal vertigo, right ear: Secondary | ICD-10-CM | POA: Diagnosis not present

## 2017-09-05 DIAGNOSIS — R2689 Other abnormalities of gait and mobility: Secondary | ICD-10-CM | POA: Diagnosis not present

## 2017-09-05 DIAGNOSIS — R2681 Unsteadiness on feet: Secondary | ICD-10-CM

## 2017-09-05 DIAGNOSIS — R42 Dizziness and giddiness: Secondary | ICD-10-CM

## 2017-09-05 NOTE — Patient Instructions (Signed)
Shoulder Flexion    While holding an elastic band at your side, draw up your arm up in front of you keeping your elbow straight.  Repeat 8-10 times for 1-2 sets.  Perform 1 times per day.   Shoulder Extension    While holding an elastic band in front of you with your elbows straight, pull the band down and back towards your side.  Repeat 5-10 times for 1-2 sets.  Perform 1 times per day.  Shoulder Internal Rotation     While holding an elastic band at your side with your elbow bent and towel roll under your arm, start with your hand away from your stomach, then pull the band towards your stomach. Keep your elbow near your side the entire time.  Repeat 5-10 times for 1-2 sets.  Perform 1 times per day.   Shoulder External Rotation    While holding an elastic band at your side with your elbow bent and towel roll under your arm, start with your hand near your stomach and then pull the band away. Keep your elbow at your side the entire time.  Repeat 5-10 times for 1-2 sets.  Perform 1 times per day. Shoulder Retraction    Holding elastic band with both hands, draw back the band as you bend your elbows. Keep your elbows near the side of your body. Squeeze shoulder blades together.  Hold 3 seconds.  Repeat 5-10 times for 1-2 sets.  Perform 1 times per day.

## 2017-09-05 NOTE — Therapy (Signed)
[facility] [address] [place], [place], [address] Phone: [phone]   Fax:  [phone]  Physical Therapy Treatment and Discharge Summary  Patient Details  Name: Heather [name] MRN: [id number] Date of Birth: 09/05/1945 Referring Provider: [name], MD   Encounter Date: 09/05/2017  PT End of Session - 09/05/17 1732    Visit Number  12    Number of Visits  17    Date for PT Re-Evaluation  09/10/17    Authorization Type  Medicare, BCBS    PT Start Time  1025 delayed start due to PT caring for ill patient     PT Stop Time  1100    PT Time Calculation (min)  35 min    Activity Tolerance  Patient tolerated treatment well    Behavior During Therapy  [facility] for tasks assessed/performed       Past Medical History:  Diagnosis Date  . Anemia    hx of  . Anxiety    r/t updated surgery  . Breast cancer ([place])    right  . Cataract    immature-not sure of which eye  . Complication of anesthesia    pt states she is sensitive to meds  . Dizziness    has been going on for 38month;medical MD aware  . Genetic testing 07/11/2016   Test Results: Normal; No pathogenic mutations detected  Genes Analyzed: 43 genes on [employer]'s Common Cancers panel (APC, ATM, AXIN2, BARD1, BMPR1A, BRCA1, BRCA2, BRIP1, CDH1, CDKN2A, CHEK2, DICER1, EPCAM, GREM1, HOXB13, KIT, MEN1, MLH1, MSH2, MSH6, MUTYH, NBN, NF1, PALB2, PDGFRA, PMS2, POLD1, POLE, PTEN, RAD50, RAD51C, RAD51D, SDHA, SDHB, SDHC, SDHD, SMAD4, SMARCA4, STK11, TP53, TSC1, TSC2, VHL).  .Marland KitchenGERD (gastroesophageal reflux disease)    Tums prn  . H. pylori infection   . H/O hiatal hernia   . Hemorrhoids   . History of UTI   . Hx of radiation therapy 10/25/12- 12/11/12   right chest wall/regional lymph nodes 5040 cGy, 28 sessions, right chest wall boost 1000 cGy 1 session  . Hx: UTI (urinary tract infection)   . Hyperlipidemia    but not on meds;diet and exercise controlled  . Hypertension     recently started Aldactone   . Insomnia    takes Melatoniin daily  . Metastasis to spinal column (HCC)    T5, Left  Femur cancer- radiation   . Neuropathy    "from back surgery"  . PONV (postoperative nausea and vomiting)    pt experienced hair loss, confusion and combative- 02/15/15  . Right knee pain   . Right shoulder pain   . Skin cancer    squamous, Nodule to back - "same cancer as breast."  . Urinary frequency    d/t taking Aldactone    Past Surgical History:  Procedure Laterality Date  . COLONOSCOPY    . cyst removed from left breast  1970  . DECOMPRESSIVE LUMBAR LAMINECTOMY LEVEL 4 N/A 02/15/2015   Procedure: DECOMPRESSION T5 AND T3-T7 STABALIZATION;  Surgeon: NConsuella Lose MD;  [facility];  Service: Neurosurgery;  Laterality: N/A;  . ESOPHAGOGASTRODUODENOSCOPY    . LAMINECTOMY N/A 03/27/2015   Procedure: Thoracic Four-Thoracic Six Laminectomy for tumor;  Surgeon: NConsuella Lose MD;  [facility];  Service: Neurosurgery;  Laterality: N/A;  T4-T6 Laminectomy  . left wrist surgery   2004   with plate  . MASTECTOMY MODIFIED RADICAL  05/10/2012   Procedure: MASTECTOMY MODIFIED RADICAL;  Surgeon: PEddie Dibbles  Jenna Luo, MD;  Location: [facility];  Service: General;  Laterality: Right;  RIGHT MODIFIED RADICAL MASTECTOMY  . MASTECTOMY, RADICAL Right   . MINOR BREAST BIOPSY Left 05/16/2016   Procedure: EXCISION OF BACK NODULE;  Surgeon: Autumn Messing III, MD;  Location: [facility];  Service: General;  Laterality: Left;  . Nodule  back  05/17/2015  . PORTACATH PLACEMENT  05/10/2012   Procedure: INSERTION PORT-A-CATH;  Surgeon: Merrie Roof, MD;  Location: [facility];  Service: General;  Laterality: Left;    There were no vitals filed for this visit.  Subjective Assessment - 09/05/17 1027    Subjective  So I didn't do the ex's for 2 days and had brief episodes of dizziness, both times was lying on her back, not moving and feeling of wooziness came over her.  Didn't seem like bPPV    Pertinent History  metastatic breast CA with mets to cerebellum, ribs, spine; T4 paraplegia due to metastatic lesions, neuropathy, HTN, osteopenia, CKD, OA    Limitations  Walking    Patient Stated Goals  Get rid of the dizziness "if that is realistic"    Currently in Pain?  No/denies             Vestibular Assessment - 09/05/17 1723      Dix-Hallpike Right   Dix-Hallpike Right Duration  0    Dix-Hallpike Right Symptoms  No nystagmus      Dix-Hallpike Left   Dix-Hallpike Left Duration  0    Dix-Hallpike Left Symptoms  No nystagmus                Vestibular Treatment/Exercise - 09/05/17 1725      Vestibular Treatment/Exercise   Gaze Exercises  X1 Viewing Horizontal;X1 Viewing Vertical      X1 Viewing Horizontal   Foot Position  feet together    Time  -- 30 sec    Reps  2    Comments  mild sway, no dizziness      X1 Viewing Vertical   Foot Position  feet together    Time  -- 30 seconds    Reps  1    Comments  mild sway, no dizziness            PT Education - 09/05/17 1726    Education Details  reviewed current HEP and discussed how to advance balance exercises, when to stop doing Brandt-Daroff, how to reduce # times doing VORx1 to see if balance remains intact and can gradually stop doing VORx1; pt requesting UE exercises and educated to complete exercises in supine (to reduce strain on her back) with no theraband progressing to lightest theraband (yellow) see handout provided--unable to practice all exercises due to lack of time, however PT demonstrated most and how to hook theraband into a doorframe if she is able to progress to standing exercises; offered pt an additional wrap up session however she declined due to starting a new chemotherapy with multiple appointments related to cancer treatment    Person(s) Educated  Patient    Methods  Explanation;Handout    Comprehension  Verbalized understanding      Educated patient in  how to "brace" herself by tightening her abdominals while simultaneously maintaining her neutral spine and to use this technique during balance exercises to reduce strain on her low back (while she experiences imbalance and works through it).   PT Short Term Goals - 08/07/17 1335      PT  SHORT TERM GOAL #1   Title  Pt will tolerate repositioning maneuvers for R posterior canal cupulolithiasis    Time  4    Period  Weeks    Status  Achieved      PT SHORT TERM GOAL #2   Title  Pt will participate in further gait and balance testing: gait velocity and FGA    Time  4    Period  Weeks    Status  Achieved      PT SHORT TERM GOAL #3   Title  Pt will initiate gaze adaptation and balance HEP with supervision    Time  4    Period  Weeks    Status  Achieved        PT Long Term Goals - 09/05/17 1734      PT LONG TERM GOAL #1   Title  Independent with vestibular and balance HEP    Baseline  independent with previously assigned HEP; added to HEP this visit and unable to fully assess return demonstration due to time constraints    Time  8    Period  Weeks    Status  Partially Met      PT LONG TERM GOAL #2   Title  Pt will demonstrate negative positional vestibular testing    Baseline  R posterior canal cupulolithiasis > converted to canalithiasis; 4/30 negative bil Hallpike-Dix    Time  8    Period  Weeks    Status  Achieved      PT LONG TERM GOAL #3   Title  Pt will improve use of VOR as indicated by DVA 2-3 line difference    Baseline  deferred; pt demonstrated 0 line difference when assessed.    Status  Deferred      PT LONG TERM GOAL #4   Title  Pt will improve gait velocity to >/= 3.6 ft/sec with cane for increased safety during community gait    Baseline  3.07 ft/sec > 3.4 ft/sec improved but not to goal    Status  Not Met      PT LONG TERM GOAL #5   Title  Pt will improve FGA by 4 points; using cane to perform as needed    Baseline  16/30 > 21/30    Status  Achieved       PT LONG TERM GOAL #6   Title  Pt will improve overall function as indicated by FOTO to >/= 60%    Baseline  51% function; unable to assess due to insufficient time     Time  8    Period  Weeks    Status  Unable to assess            Plan - 09/05/17 1737    Clinical Impression Statement  Patient seen for final visit to complete checking LTGs and assess patient's independence with her HEP. Unfortunately began session 10 minutes late due to therapist attending to previous patient that was unwell. Of her 6 LTGs, one goal was not assessed due to lack of time (discharge FOTO); one goal was earlier deferred as not indicated; 2 goals were achieved; 1 goal partially met, and 1 goal was not met (improved but not to goal level). Patient offered an additional visit/session due to lack of complete time and all goals not assessed, however pt is beginning a new cancer medication and has multiple appoints related to this (and then is not sure if she will feel well enough  to come). Patient discharged as she is pleased with her progress and plans to continue her HEP.     Rehab Potential  Fair    Clinical Impairments Affecting Rehab Potential  currently undergoing chemotherapy for metastatic cancer, sensory changes affecting balance and vestibular system    PT Treatment/Interventions  ADLs/Self Care Home Management;Canalith Repostioning;DME Instruction;Gait training;Functional mobility training;Therapeutic activities;Therapeutic exercise;Balance training;Neuromuscular re-education;Patient/family education;Vestibular    PT Home Exercise Plan  HEP- standing hip flexion, abduction, chest stretch, heel/toe raise    Consulted and Agree with Plan of Care  Patient       Patient will benefit from skilled therapeutic intervention in order to improve the following deficits and impairments:  Decreased balance, Dizziness, Impaired sensation, Difficulty walking  Visit Diagnosis: Unsteadiness on feet  Dizziness and  giddiness  Muscle weakness (generalized)     Problem List Patient Active Problem List   Diagnosis Date Noted  . Goals of care, counseling/discussion 08/10/2017  . Brain metastasis ([place]) 11/18/2016  . Genetic testing 07/11/2016  . ALLERGY TO CHLORHEXADINE, USE BETADINE ONLY 06/23/2016  . Kyphosis of thoracic region 06/13/2016  . Plantar fasciitis, bilateral 04/20/2016  . Neuropathic pain 01/27/2016  . Anemia of chronic disease 05/21/2015  . Metastatic cancer to spine ([place]) 03/27/2015  . Thrombocytopenia ([place]) 03/12/2015  . Prerenal azotemia 03/12/2015  . Pain   . Recurrent UTI--due to Klebsiella   . Paraplegia at T4 level ([place]) 02/20/2015  . Neurogenic bowel 02/20/2015  . Neurogenic bladder 02/20/2015  . Postoperative anemia due to acute blood loss 02/17/2015  . Spinal cord compression due to malignant neoplasm metastatic to spine ([place]) 02/15/2015  . Epidural mass 02/15/2015  . Thoracic spine tumor   . Pathologic fracture of vertebra 02/14/2015  . Pathologic fracture of thoracic vertebrae 02/14/2015  . Breast cancer metastasized to bone, right ([place]) 02/14/2015  . Hyperlipidemia 02/14/2015  . H. pylori infection   . HTN (hypertension) 04/17/2012  . Primary cancer of upper outer quadrant of right female breast ([place]) 03/08/2012   PHYSICAL THERAPY DISCHARGE SUMMARY  Visits from Start of Care: 12  Current functional level related to goals / functional outcomes: See LTGs above   Remaining deficits: Limited tolerance for some balance and strengthening activities due to low back pain. Patient reports she is learning how to modify movements to lessen her back pain   Education / Equipment: See HEP  Plan: Patient agrees to discharge.  Patient goals were partially met. Patient is being discharged due to being pleased with the current functional level.  ?????       Rexanne Mano, PT 09/05/2017, 5:46 PM  [facility] [address] [place], [place], [address] Phone: [phone]   Fax:  [phone]  Name: Heather [name] MRN: [id number] Date of Birth: 02-13-1946

## 2017-09-07 ENCOUNTER — Telehealth: Payer: Self-pay

## 2017-09-07 ENCOUNTER — Telehealth: Payer: Self-pay | Admitting: Hematology and Oncology

## 2017-09-07 ENCOUNTER — Inpatient Hospital Stay: Payer: Medicare Other

## 2017-09-07 ENCOUNTER — Inpatient Hospital Stay (HOSPITAL_BASED_OUTPATIENT_CLINIC_OR_DEPARTMENT_OTHER): Payer: Medicare Other | Admitting: Hematology and Oncology

## 2017-09-07 ENCOUNTER — Inpatient Hospital Stay: Payer: Medicare Other | Attending: Hematology and Oncology

## 2017-09-07 DIAGNOSIS — Z79899 Other long term (current) drug therapy: Secondary | ICD-10-CM | POA: Insufficient documentation

## 2017-09-07 DIAGNOSIS — C7931 Secondary malignant neoplasm of brain: Secondary | ICD-10-CM | POA: Diagnosis not present

## 2017-09-07 DIAGNOSIS — Z9221 Personal history of antineoplastic chemotherapy: Secondary | ICD-10-CM | POA: Insufficient documentation

## 2017-09-07 DIAGNOSIS — Z923 Personal history of irradiation: Secondary | ICD-10-CM | POA: Insufficient documentation

## 2017-09-07 DIAGNOSIS — Z170 Estrogen receptor positive status [ER+]: Secondary | ICD-10-CM | POA: Diagnosis not present

## 2017-09-07 DIAGNOSIS — C7951 Secondary malignant neoplasm of bone: Principal | ICD-10-CM

## 2017-09-07 DIAGNOSIS — C50411 Malignant neoplasm of upper-outer quadrant of right female breast: Secondary | ICD-10-CM

## 2017-09-07 DIAGNOSIS — C50911 Malignant neoplasm of unspecified site of right female breast: Secondary | ICD-10-CM

## 2017-09-07 DIAGNOSIS — Z5112 Encounter for antineoplastic immunotherapy: Secondary | ICD-10-CM | POA: Insufficient documentation

## 2017-09-07 LAB — BASIC METABOLIC PANEL
Anion gap: 8 (ref 3–11)
BUN: 14 mg/dL (ref 7–26)
CO2: 28 mmol/L (ref 22–29)
Calcium: 10.5 mg/dL — ABNORMAL HIGH (ref 8.4–10.4)
Chloride: 104 mmol/L (ref 98–109)
Creatinine, Ser: 0.89 mg/dL (ref 0.60–1.10)
GFR calc Af Amer: >60 mL/min (ref >60)
GFR calc non Af Amer: >60 mL/min (ref >60)
Glucose, Bld: 83 mg/dL (ref 70–140)
Potassium: 4.1 mmol/L (ref 3.5–5.1)
Sodium: 140 mmol/L (ref 136–145)

## 2017-09-07 LAB — CBC WITH DIFFERENTIAL (CANCER CENTER ONLY)
Basophils Absolute: 0.1 10*3/uL (ref 0.0–0.1)
Basophils Relative: 3 %
Eosinophils Absolute: 0.1 10*3/uL (ref 0.0–0.5)
Eosinophils Relative: 4 %
HCT: 35.9 % (ref 34.8–46.6)
Hemoglobin: 11.7 g/dL (ref 11.6–15.9)
Lymphocytes Relative: 15 %
Lymphs Abs: 0.6 10*3/uL — ABNORMAL LOW (ref 0.9–3.3)
MCH: 27.9 pg (ref 25.1–34.0)
MCHC: 32.7 g/dL (ref 31.5–36.0)
MCV: 85.2 fL (ref 79.5–101.0)
Monocytes Absolute: 0.5 10*3/uL (ref 0.1–0.9)
Monocytes Relative: 13 %
Neutro Abs: 2.5 10*3/uL (ref 1.5–6.5)
Neutrophils Relative %: 65 %
Platelet Count: 200 10*3/uL (ref 145–400)
RBC: 4.22 MIL/uL (ref 3.70–5.45)
RDW: 23.3 % — ABNORMAL HIGH (ref 11.2–14.5)
WBC Count: 3.8 10*3/uL — ABNORMAL LOW (ref 3.9–10.3)

## 2017-09-07 MED ORDER — SODIUM CHLORIDE 0.9 % IV SOLN
420.0000 mg | Freq: Once | INTRAVENOUS | Status: AC
  Administered 2017-09-07: 420 mg via INTRAVENOUS
  Filled 2017-09-07: qty 14

## 2017-09-07 MED ORDER — FULVESTRANT 250 MG/5ML IM SOLN
INTRAMUSCULAR | Status: AC
  Filled 2017-09-07: qty 10

## 2017-09-07 MED ORDER — HEPARIN SOD (PORK) LOCK FLUSH 100 UNIT/ML IV SOLN
500.0000 [IU] | Freq: Once | INTRAVENOUS | Status: AC | PRN
  Administered 2017-09-07: 500 [IU]
  Filled 2017-09-07: qty 5

## 2017-09-07 MED ORDER — SODIUM CHLORIDE 0.9 % IV SOLN
Freq: Once | INTRAVENOUS | Status: AC
  Administered 2017-09-07: 12:00:00 via INTRAVENOUS

## 2017-09-07 MED ORDER — ACETAMINOPHEN 325 MG PO TABS
650.0000 mg | ORAL_TABLET | Freq: Once | ORAL | Status: AC
  Administered 2017-09-07: 650 mg via ORAL

## 2017-09-07 MED ORDER — SODIUM CHLORIDE 0.9% FLUSH
10.0000 mL | INTRAVENOUS | Status: DC | PRN
  Administered 2017-09-07: 10 mL
  Filled 2017-09-07: qty 10

## 2017-09-07 MED ORDER — FULVESTRANT 250 MG/5ML IM SOLN
500.0000 mg | Freq: Once | INTRAMUSCULAR | Status: AC
  Administered 2017-09-07: 500 mg via INTRAMUSCULAR

## 2017-09-07 MED ORDER — TRASTUZUMAB CHEMO 150 MG IV SOLR
6.0000 mg/kg | Freq: Once | INTRAVENOUS | Status: AC
  Administered 2017-09-07: 357 mg via INTRAVENOUS
  Filled 2017-09-07: qty 17

## 2017-09-07 MED ORDER — ACETAMINOPHEN 325 MG PO TABS
ORAL_TABLET | ORAL | Status: AC
  Filled 2017-09-07: qty 2

## 2017-09-07 NOTE — Telephone Encounter (Signed)
Ok to treat with previous ECHO. Patient gets Echo every six months.  Cyndia Bent RN

## 2017-09-07 NOTE — Progress Notes (Signed)
Patient Care Team: [name], MD as PCP - General (Family Medicine)  DIAGNOSIS:  Encounter Diagnosis  Name Primary?  . Primary cancer of upper outer quadrant of right female breast ([place])     SUMMARY OF ONCOLOGIC HISTORY:   Primary cancer of upper outer quadrant of right female breast ([place])   03/08/2012 Initial Diagnosis    Right breast invasive ductal carcinoma ER positive PR negative HER-2 positive Ki-67 44%; another breast mass biopsied in the anterior part of the breast which was also positive for malignancy that was HER-2 negative      05/10/2012 Surgery    Right mastectomy and axillary lymph node dissection: Multifocal disease 5 cm, 1.7 cm, 1.6 cm, ER positive PR negative HER-2 positive Ki-67 44%, 3/7 lymph nodes positive      06/14/2012 - 06/12/2013 Chemotherapy    Adjuvant chemotherapy with Braymer 6 followed by Herceptin maintenance      10/25/2012 - 12/11/2012 Radiation Therapy    Adjuvant radiation therapy      02/07/2013 -  Anti-estrogen oral therapy    Letrozole 2.5 mg daily      02/14/2015 Imaging    MRI spine: Large destructive T5 lesion with severe pathologic compression fracture and extensive epidural tumor, severe spinal stenosis with moderate cord compression, tumor involvement of T4      03/18/2015 PET scan    Residual enhancing soft tissue adjacent to the spinal cord. No evidence of metastatic disease. Nonspecific uptake the left nipple      03/27/2015 - 03/30/2015 Hospital Admission    T4-6 decompression for spinal cord compression (lower extremity paralysis)      04/10/2015 -  Chemotherapy    Palliative treatment with Herceptin every 3 weeks with letrozole 2.5 mg daily      04/13/2015 - 05/19/2015 Radiation Therapy    Palliative radiation treatment to the spine      09/01/2015 Imaging    CT chest abdomen pelvis: Pathologic fracture with posterior fusion at T5 no other evidence of metastatic disease in the chest abdomen pelvis      12/23/2015 Imaging      CT CAP: new nonspecific 0.6 cm lymph node was stated mediastinum needs follow-up CT, innumerable tiny groundglass pulmonary nodules throughout both lungs unchanged, patchy consolidation from radiation, T5 fracture, no mets      04/15/2016 PET scan    Rt para-spinal mass mildy hypermetabolic SUV 5.9, lytic cortical lesion 4.8 cm lesion left prox femur suv 8.8 favor osseus met disease      04/28/2016 - 05/12/2016 Radiation Therapy    Palliative XRT Left femur      05/24/2016 Procedure    Soft tissue mass biopsy back: Metastatic breast cancer ER 80%, PR 0%, Ki-67 50%, HER-2 positive ratio 2.25      06/09/2016 - 03/29/2017 Anti-estrogen oral therapy    Faslodex with Herceptin and Perjeta every 4 weeks      06/13/2016 - 06/15/2016 Hospital Admission    uncomplicated revision of previous thoracic instrumentation.      10/10/2016 PET scan    Interval development of new hypermetabolic 2.5 x 1.1 cm lesion posterior right eighth rib consistent with metastatic disease, stable right paraspinal lesion at T8, clustered soft tissue nodules in the posterior midline back near cervicothoracic junction smaller than before       11/03/2016 - 11/07/2016 Radiation Therapy    Palliative radiation to right eighth rib      11/04/2016 Imaging    Solitary contrast-enhancing lesion in the right  cerebellum 6 x 2 mm concerning for metastatic lesion      11/24/2016 - 11/24/2016 Radiation Therapy    SRS to the brain lesion      04/18/2017 PET scan    New ill-defined rounded airspace opacity in posterior right lower lobe. Differential diagnosis includes malignancy and infectious/inflammatory process.Increased hypermetabolic lytic lesion in T8 vertebral body, consistent with bone metastasis.  Increased hypermetabolic posterior upper chest wall subcutaneous nodules, highly suspicious metastatic disease. Further improvement in right posterior eighth rib metastasis.      05/04/2017 - 07/27/2017 Chemotherapy    Kadcyla  every 3 weeks palliative chemotherapy stopped for progression      07/26/2017 Imaging    Interval increase in size of the multiple enhancing masses posteriorly overlying the spine, interval development of left axillary and mediastinal adenopathy, increase in the size of lytic lesions in T8-T9 and left ninth rib, increased groundglass opacities right lower lobe lung      07/27/2017 -  Chemotherapy    Palbociclib + Faslodex + trastuzumab + Pertuzumab         CHIEF COMPLIANT: Follow-up on reduced dosage of palbociclib  INTERVAL HISTORY: Heather [name] is a 72 year old with above-mentioned history of ER positive HER-2 positive metastatic breast cancer currently on palbociclib along with Herceptin and Perjeta.  She appears to be tolerating palbociclib reasonably well.  Because of leukopenia and thrombocytopenia we reduce the dosage of palbociclib 2 weeks ago 100 mg daily.  She is here today to recheck her blood counts.  Overall she has been tolerating the treatment extremely well.  She continues to have some difficulty with ambulation.  She has several palpable cutaneous nodules.  REVIEW OF SYSTEMS:   Constitutional: Denies fevers, chills or abnormal weight loss Eyes: Denies blurriness of vision Ears, nose, mouth, throat, and face: Denies mucositis or sore throat Respiratory: Denies cough, dyspnea or wheezes Cardiovascular: Denies palpitation, chest discomfort Gastrointestinal:  Denies nausea, heartburn or change in bowel habits Skin: Denies abnormal skin rashes Lymphatics: Denies new lymphadenopathy or easy bruising Neurological:Denies numbness, tingling or new weaknesses Behavioral/Psych: Mood is stable, no new changes  Extremities: No lower extremity edema Breast:  denies any pain or lumps or nodules in either breasts All other systems were reviewed with the patient and are negative.  I have reviewed the past medical history, past surgical history, social history and family history with  the patient and they are unchanged from previous note.  ALLERGIES:  is allergic to other; acyclovir and related; zanaflex [tizanidine hcl]; codeine; keflex [cephalexin]; naprosyn [naproxen]; oxycodone; tramadol; and tussin [guaifenesin].  MEDICATIONS:  Current Outpatient Medications  Medication Sig Dispense Refill  . ALPRAZolam (XANAX) 0.25 MG tablet Take 1 tablet by mouth as needed.    Marland Kitchen b complex vitamins tablet Take 1 tablet by mouth daily.    . Calcium Carbonate Antacid (TUMS PO) Take 2 tablets by mouth 2 (two) times daily as needed (acid reflux).    . Cholecalciferol (VITAMIN D) 2000 UNITS tablet Take 2,000 Units by mouth daily.    . Coenzyme Q10 (COQ10) 100 MG CAPS Take 100 mg by mouth as needed. Days that she takes pravastatin MWF.    Marland Kitchen fluticasone (FLONASE) 50 MCG/ACT nasal spray Place 1 spray into both nostrils daily as needed.    . gabapentin (NEURONTIN) 600 MG tablet Take 1 tablet (600 mg total) by mouth as directed. Take 1 tablet in the AM and afternoon and 1 1/2 tabs at bedtime (Patient taking differently: Take 600 mg by  mouth 3 (three) times daily. Take 1 tablet in the AM and afternoon and 1 1/2 tabs at bedtime) 315 tablet 2  . HYDROcodone-acetaminophen (NORCO/VICODIN) 5-325 MG tablet     . lidocaine-prilocaine (EMLA) cream Apply to affected area once (Patient taking differently: Apply 1 application topically every 30 (thirty) days. Apply to port prior to infusions) 30 g 3  . OVER THE COUNTER MEDICATION Place 1 drop into both eyes 2 (two) times daily as needed (dry eyes). Over the counter lubricating eye drops    . palbociclib (IBRANCE) 100 MG capsule Take 1 capsule (100 mg total) by mouth daily with breakfast. Take for 21 days on, 7 days off, repeat every 28 days. Take whole with food. 21 capsule 6  . Probiotic Product (PROBIOTIC DAILY PO) Take 1 capsule by mouth daily.     . ranitidine (ZANTAC) 300 MG tablet Take 300 mg by mouth at bedtime. Reported on 06/25/2015    .  spironolactone (ALDACTONE) 25 MG tablet Take 25 mg by mouth daily.     No current facility-administered medications for this visit.    Facility-Administered Medications Ordered in Other Visits  Medication Dose Route Frequency Provider Last Rate Last Dose  . sodium chloride 0.9 % injection 10 mL  10 mL Intravenous PRN [name], MD   10 mL at 04/26/17 5366    PHYSICAL EXAMINATION: ECOG PERFORMANCE STATUS: 1 - Symptomatic but completely ambulatory  Vitals:   09/07/17 1036  BP: 137/63  Pulse: 71  Resp: 18  Temp: 97.7 F (36.5 C)  SpO2: 100%   Filed Weights   09/07/17 1036  Weight: 128 lb 14.4 oz (58.5 kg)    GENERAL:alert, no distress and comfortable SKIN: skin color, texture, turgor are normal, no rashes or significant lesions EYES: normal, Conjunctiva are pink and non-injected, sclera clear OROPHARYNX:no exudate, no erythema and lips, buccal mucosa, and tongue normal  NECK: supple, thyroid normal size, non-tender, without nodularity LYMPH:  no palpable lymphadenopathy in the cervical, axillary or inguinal LUNGS: clear to auscultation and percussion with normal breathing effort HEART: regular rate & rhythm and no murmurs and no lower extremity edema ABDOMEN:abdomen soft, non-tender and normal bowel sounds MUSCULOSKELETAL:no cyanosis of digits and no clubbing  NEURO: alert & oriented x 3 with fluent speech, no focal motor/sensory deficits EXTREMITIES: No lower extremity edema  LABORATORY DATA:  I have reviewed the data as listed CMP Latest Ref Rng & Units 09/07/2017 08/24/2017 08/10/2017  Glucose 70 - 140 mg/dL 83 99 98  BUN 7 - 26 mg/dL _0 Creatinine 0.60 - 1.10 mg/dL 0.89 0.82 0.95  Sodium 136 - 145 mmol/L 140 138 137  Potassium 3.5 - 5.1 mmol/L 4.1 3.9 4.0  Chloride 98 - 109 mmol/L 104 104 102  CO2 22 - 29 mmol/L _1 Calcium 8.4 - 10.4 mg/dL 10.5(H) 10.0 11.6(H)  Total Protein 6.4 - 8.3 g/dL - - 7.6  Total Bilirubin 0.2 - 1.2 mg/dL - - 0.5  Alkaline  Phos 40 - 150 U/L - - 82  AST 5 - 34 U/L - - 35(H)  ALT 0 - 55 U/L - - 16    Lab Results  Component Value Date   WBC 3.8 (L) 09/07/2017   HGB 11.7 09/07/2017   HCT 35.9 09/07/2017   MCV 85.2 09/07/2017   PLT 200 09/07/2017   NEUTROABS 2.5 09/07/2017    ASSESSMENT & PLAN:  Primary cancer of upper outer quadrant of right female breast ([place]) Right  breast invasive ductal carcinoma ER positive PR negative HER-2 positive Ki-67 44% multifocal disease 3/7 lymph nodes positive T2 N1 M0 stage IIB status post adjuvant chemotherapy with TCH followed by Herceptin maintenance, adjuvant radiation therapy and Letrozole. MRI 02/14/2015: T5 large destructive lesion with pathologic compression fracture and extensive epidural tumor, involvement of T4, excision metastatic carcinoma ER 60%, PR 0%, HER-2 positive ratio 2.71 average copy #7.45 03/27/2015: T4-6 decompression surgery for spinal cord compression PET-CT 12/10/17Rt para-spinal mass mildy hypermetabolic SUV 5.9, lytic cortical lesion 4.8 cm lesion left prox femur suv 8.8 favor osseus met disease Echo 04/12/2017 shows well preserved ejection fraction of 55-60%   Treatment summary: 1. Resumed anastrozole 05/21/2015, but it has caused significant hair loss so we switched her to exemestane 25 mg once daily 07/23/2015 2. Herceptin every 3 week started 04/10/2015 3. Bone metastases: Zometa every 3 months-next due late May/early June 4. Radiation therapy to the spine (Dr. [name]) 04/13/2015 to 05/19/2015 5. Radiation therapy to left femur 04/28/2016 to 05/12/2016  6. SRS to the brain lesion 11/24/2016 7.  Kadcyla started 05/05/2017 discontinued 07/27/2017 -------------------------------------------------------------------------------------------------------------------------- Current treatment: Palbociclib +Faslodex +trastuzumab+ Pertuzumab started 08/10/2017  Toxicities of palbociclib: 1.  ANC 2500, platelets 200, reduced dosage of palbociclib  to 100 mg daily I instructed the patient to resume Ibrance at 100 mg starting today. She will return to see [place] in 2 weeks  She will proceed with Faslodex and Trastuzumab, Pertuzumab we will change her treatment to every 4 weeks.  F/u in 4weeks with labs prior.  Orders Placed This Encounter  Procedures  . CBC with Differential (Cancer Center Only)    Standing Status:   Standing    Number of Occurrences:   20    Standing Expiration Date:   09/08/2018  . CMP ([facility] only)    Standing Status:   Standing    Number of Occurrences:   20    Standing Expiration Date:   09/08/2018   The patient has a good understanding of the overall plan. she agrees with it. she will call with any problems that may develop before the next visit here.   Harriette Ohara, MD 09/07/17

## 2017-09-07 NOTE — Progress Notes (Signed)
09/07/17 @ 1445  Confirmed with Dr [name] to give Faslodex today then begin q 4 week dosing to coincide with other infusions.  V.O. Dr Jannifer Hick, PharmD

## 2017-09-07 NOTE — Assessment & Plan Note (Signed)
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 MRI 02/14/2015: T5 large destructive lesion with pathologic compression fracture and extensive epidural tumor, involvement of T4, excision metastatic carcinoma ER 60%, PR 0%, HER-2 positive ratio 2.71 average copy #7.45 03/27/2015: T4-6 decompression surgery for spinal cord compression PET-CT 12/10/17Rt para-spinal mass mildy hypermetabolic SUV 5.9, lytic cortical lesion 4.8 cm lesion left prox femur suv 8.8 favor osseus met disease Echo 04/12/2017 shows well preserved ejection fraction of 55-60%   Treatment summary: 1. Resumed anastrozole 05/21/2015, but it has caused significant hair loss so we switched her to exemestane 25 mg once daily 07/23/2015 2. Herceptin every 3 week started 04/10/2015 3. Bone metastases: Zometa every 3 months-next due late May/early June 4. Radiation therapy to the spine (Dr. [name]) 04/13/2015 to 05/19/2015 5. Radiation therapy to left femur 04/28/2016 to 05/12/2016  6. SRS to the brain lesion 11/24/2016 7.  Kadcyla started 05/05/2017 discontinued 07/27/2017 -------------------------------------------------------------------------------------------------------------------------- Current treatment: Palbociclib +Faslodex +trastuzumab+ Pertuzumab started 08/10/2017  Toxicities of palbociclib: 1.  ANC 1100, platelets 81, reduced dosage of palbociclib 200 mg daily  She will proceed with Faslodex and Trastuzumab, Pertuzumab today.   F/u in 4weeks with labs prior.

## 2017-09-07 NOTE — Patient Instructions (Signed)
Implanted Port Home Guide An implanted port is a type of central line that is placed under the skin. Central lines are used to provide IV access when treatment or nutrition needs to be given through a person's veins. Implanted ports are used for long-term IV access. An implanted port may be placed because:  You need IV medicine that would be irritating to the small veins in your hands or arms.  You need long-term IV medicines, such as antibiotics.  You need IV nutrition for a long period.  You need frequent blood draws for lab tests.  You need dialysis.  Implanted ports are usually placed in the chest area, but they can also be placed in the upper arm, the abdomen, or the leg. An implanted port has two main parts:  Reservoir. The reservoir is round and will appear as a small, raised area under your skin. The reservoir is the part where a needle is inserted to give medicines or draw blood.  Catheter. The catheter is a thin, flexible tube that extends from the reservoir. The catheter is placed into a large vein. Medicine that is inserted into the reservoir goes into the catheter and then into the vein.  How will I care for my incision site? Do not get the incision site wet. Bathe or shower as directed by your health care provider. How is my port accessed? Special steps must be taken to access the port:  Before the port is accessed, a numbing cream can be placed on the skin. This helps numb the skin over the port site.  Your health care provider uses a sterile technique to access the port. ? Your health care provider must put on a mask and sterile gloves. ? The skin over your port is cleaned carefully with an antiseptic and allowed to dry. ? The port is gently pinched between sterile gloves, and a needle is inserted into the port.  Only "non-coring" port needles should be used to access the port. Once the port is accessed, a blood return should be checked. This helps ensure that the port  is in the vein and is not clogged.  If your port needs to remain accessed for a constant infusion, a clear (transparent) bandage will be placed over the needle site. The bandage and needle will need to be changed every week, or as directed by your health care provider.  Keep the bandage covering the needle clean and dry. Do not get it wet. Follow your health care provider's instructions on how to take a shower or bath while the port is accessed.  If your port does not need to stay accessed, no bandage is needed over the port.  What is flushing? Flushing helps keep the port from getting clogged. Follow your health care provider's instructions on how and when to flush the port. Ports are usually flushed with saline solution or a medicine called heparin. The need for flushing will depend on how the port is used.  If the port is used for intermittent medicines or blood draws, the port will need to be flushed: ? After medicines have been given. ? After blood has been drawn. ? As part of routine maintenance.  If a constant infusion is running, the port may not need to be flushed.  How long will my port stay implanted? The port can stay in for as long as your health care provider thinks it is needed. When it is time for the port to come out, surgery will be   done to remove it. The procedure is similar to the one performed when the port was put in. When should I seek immediate medical care? When you have an implanted port, you should seek immediate medical care if:  You notice a bad smell coming from the incision site.  You have swelling, redness, or drainage at the incision site.  You have more swelling or pain at the port site or the surrounding area.  You have a fever that is not controlled with medicine.  This information is not intended to replace advice given to you by your health care provider. Make sure you discuss any questions you have with your health care provider. Document  Released: 04/25/2005 Document Revised: 10/01/2015 Document Reviewed: 12/31/2012 Else[employer] Interactive Patient Education  2017 [employer]  

## 2017-09-07 NOTE — Patient Instructions (Signed)
[facility] Discharge Instructions for Patients Receiving Chemotherapy  Today you received the following chemotherapy agents: Herceptin and Perjeta.  To help prevent nausea and vomiting after your treatment, we encourage you to take your nausea medication as prescribed.   If you develop nausea and vomiting that is not controlled by your nausea medication, call the clinic.   BELOW ARE SYMPTOMS THAT SHOULD BE REPORTED IMMEDIATELY:  *FEVER GREATER THAN 100.5 F  *CHILLS WITH OR WITHOUT FEVER  NAUSEA AND VOMITING THAT IS NOT CONTROLLED WITH YOUR NAUSEA MEDICATION  *UNUSUAL SHORTNESS OF BREATH  *UNUSUAL BRUISING OR BLEEDING  TENDERNESS IN MOUTH AND THROAT WITH OR WITHOUT PRESENCE OF ULCERS  *URINARY PROBLEMS  *BOWEL PROBLEMS  UNUSUAL RASH Items with * indicate a potential emergency and should be followed up as soon as possible.  Feel free to call the clinic should you have any questions or concerns. The clinic phone number is [phone].  Please show the [place] at check-in to the Emergency Department and triage nurse.

## 2017-09-07 NOTE — Telephone Encounter (Signed)
Scheduled appt per 5/2 los - Gave patient aVS and calender per los.

## 2017-09-07 NOTE — Progress Notes (Signed)
Per Jonelle Sidle, RN  [name] is ok to treat with echo from 12/18.

## 2017-09-12 ENCOUNTER — Ambulatory Visit: Payer: Self-pay | Admitting: Urology

## 2017-09-21 ENCOUNTER — Ambulatory Visit
Admission: RE | Admit: 2017-09-21 | Discharge: 2017-09-21 | Disposition: A | Discharge status: Home / Self Care | Payer: Medicare Other | Source: Ambulatory Visit | Attending: Radiation Oncology | Admitting: Radiation Oncology

## 2017-09-21 ENCOUNTER — Other Ambulatory Visit: Payer: Self-pay | Admitting: Pharmacist

## 2017-09-21 ENCOUNTER — Inpatient Hospital Stay: Payer: Medicare Other

## 2017-09-21 ENCOUNTER — Other Ambulatory Visit: Payer: Medicare Other

## 2017-09-21 ENCOUNTER — Inpatient Hospital Stay (HOSPITAL_BASED_OUTPATIENT_CLINIC_OR_DEPARTMENT_OTHER): Payer: Medicare Other | Admitting: Hematology and Oncology

## 2017-09-21 DIAGNOSIS — C50411 Malignant neoplasm of upper-outer quadrant of right female breast: Secondary | ICD-10-CM

## 2017-09-21 DIAGNOSIS — C7949 Secondary malignant neoplasm of other parts of nervous system: Secondary | ICD-10-CM

## 2017-09-21 DIAGNOSIS — C7931 Secondary malignant neoplasm of brain: Secondary | ICD-10-CM

## 2017-09-21 DIAGNOSIS — C7951 Secondary malignant neoplasm of bone: Secondary | ICD-10-CM | POA: Diagnosis not present

## 2017-09-21 DIAGNOSIS — Z79899 Other long term (current) drug therapy: Secondary | ICD-10-CM | POA: Diagnosis not present

## 2017-09-21 DIAGNOSIS — Z5112 Encounter for antineoplastic immunotherapy: Secondary | ICD-10-CM | POA: Diagnosis not present

## 2017-09-21 DIAGNOSIS — Z170 Estrogen receptor positive status [ER+]: Secondary | ICD-10-CM | POA: Diagnosis not present

## 2017-09-21 LAB — CBC WITH DIFFERENTIAL (CANCER CENTER ONLY)
Basophils Absolute: 0.1 10*3/uL (ref 0.0–0.1)
Basophils Relative: 3 %
Eosinophils Absolute: 0.1 10*3/uL (ref 0.0–0.5)
Eosinophils Relative: 6 %
HCT: 36.9 % (ref 34.8–46.6)
Hemoglobin: 12 g/dL (ref 11.6–15.9)
Lymphocytes Relative: 28 %
Lymphs Abs: 0.5 10*3/uL — ABNORMAL LOW (ref 0.9–3.3)
MCH: 28.8 pg (ref 25.1–34.0)
MCHC: 32.5 g/dL (ref 31.5–36.0)
MCV: 88.5 fL (ref 79.5–101.0)
Monocytes Absolute: 0.1 10*3/uL (ref 0.1–0.9)
Monocytes Relative: 4 %
Neutro Abs: 1.1 10*3/uL — ABNORMAL LOW (ref 1.5–6.5)
Neutrophils Relative %: 59 %
Platelet Count: 113 10*3/uL — ABNORMAL LOW (ref 145–400)
RBC: 4.17 MIL/uL (ref 3.70–5.45)
RDW: 21.3 % — ABNORMAL HIGH (ref 11.2–14.5)
WBC Count: 1.9 10*3/uL — ABNORMAL LOW (ref 3.9–10.3)

## 2017-09-21 LAB — CMP (CANCER CENTER ONLY)
ALT: 22 U/L (ref 0–55)
AST: 34 U/L (ref 5–34)
Albumin: 3.8 g/dL (ref 3.5–5.0)
Alkaline Phosphatase: 73 U/L (ref 40–150)
Anion gap: 8 (ref 3–11)
BUN: 17 mg/dL (ref 7–26)
CO2: 28 mmol/L (ref 22–29)
Calcium: 9.5 mg/dL (ref 8.4–10.4)
Chloride: 102 mmol/L (ref 98–109)
Creatinine: 1.08 mg/dL (ref 0.60–1.10)
GFR, Est AFR Am: 58 mL/min — ABNORMAL LOW (ref >60)
GFR, Estimated: 50 mL/min — ABNORMAL LOW (ref >60)
Glucose, Bld: 113 mg/dL (ref 70–140)
Potassium: 3.9 mmol/L (ref 3.5–5.1)
Sodium: 138 mmol/L (ref 136–145)
Total Bilirubin: 0.4 mg/dL (ref 0.2–1.2)
Total Protein: 7.3 g/dL (ref 6.4–8.3)

## 2017-09-21 MED ORDER — SODIUM CHLORIDE 0.9 % IJ SOLN
10.0000 mL | INTRAMUSCULAR | Status: DC | PRN
  Filled 2017-09-21: qty 10

## 2017-09-21 MED ORDER — HEPARIN SOD (PORK) LOCK FLUSH 100 UNIT/ML IV SOLN
500.0000 [IU] | Freq: Once | INTRAVENOUS | Status: DC | PRN
  Filled 2017-09-21: qty 5

## 2017-09-21 MED ORDER — GADOBENATE DIMEGLUMINE 529 MG/ML IV SOLN
12.0000 mL | Freq: Once | INTRAVENOUS | Status: AC | PRN
  Administered 2017-09-21: 12 mL via INTRAVENOUS

## 2017-09-21 MED FILL — IBRANCE 100 MG CAPSULE: 100 | 28 days supply | Qty: 21 | Fill #0

## 2017-09-21 NOTE — Assessment & Plan Note (Signed)
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 MRI 02/14/2015: T5 large destructive lesion with pathologic compression fracture and extensive epidural tumor, involvement of T4, excision metastatic carcinoma ER 60%, PR 0%, HER-2 positive ratio 2.71 average copy #7.45 03/27/2015: T4-6 decompression surgery for spinal cord compression PET-CT 12/10/17Rt para-spinal mass mildy hypermetabolic SUV 5.9, lytic cortical lesion 4.8 cm lesion left prox femur suv 8.8 favor osseus met disease Echo 04/12/2017 shows well preserved ejection fraction of 55-60%   Treatment summary: 1. Resumed anastrozole 05/21/2015, but it has caused significant hair loss so we switched her to exemestane 25 mg once daily 07/23/2015 2. Herceptin every 3 week started 04/10/2015 3. Bone metastases: Zometa every 3 months-next duelate May/early June 4. Radiation therapy to the spine (Dr. [name]) 04/13/2015 to 05/19/2015 5. Radiation therapy to left femur 04/28/2016 to 05/12/2016  6. SRS to the brain lesion 11/24/2016 7.Kadcyla started 05/05/2017 discontinued 07/27/2017 -------------------------------------------------------------------------------------------------------------------------- Current treatment:Palbociclib +Faslodex +trastuzumab+ Pertuzumabstarted 08/10/2017  Toxicities of palbociclib: 1.   Neutropenia: Decrease dosage to 100 mg palbociclib, today's ANC is  Faslodex and Trastuzumab, Pertuzumab every 4 weeks. F/u in 4weeks with labs prior.

## 2017-09-21 NOTE — Progress Notes (Signed)
Patient Care Team: Heather Stains, MD as PCP - General (Family Medicine)  DIAGNOSIS:  Encounter Diagnosis  Name Primary?  . Primary cancer of upper outer quadrant of right female breast ([place])     SUMMARY OF ONCOLOGIC HISTORY:   Primary cancer of upper outer quadrant of right female breast ([place])   03/08/2012 Initial Diagnosis    Right breast invasive ductal carcinoma ER positive PR negative HER-2 positive Ki-67 44%; another breast mass biopsied in the anterior part of the breast which was also positive for malignancy that was HER-2 negative      05/10/2012 Surgery    Right mastectomy and axillary lymph node dissection: Multifocal disease 5 cm, 1.7 cm, 1.6 cm, ER positive PR negative HER-2 positive Ki-67 44%, 3/7 lymph nodes positive      06/14/2012 - 06/12/2013 Chemotherapy    Adjuvant chemotherapy with [place] 6 followed by Herceptin maintenance      10/25/2012 - 12/11/2012 Radiation Therapy    Adjuvant radiation therapy      02/07/2013 -  Anti-estrogen oral therapy    Letrozole 2.5 mg daily      02/14/2015 Imaging    MRI spine: Large destructive T5 lesion with severe pathologic compression fracture and extensive epidural tumor, severe spinal stenosis with moderate cord compression, tumor involvement of T4      03/18/2015 PET scan    Residual enhancing soft tissue adjacent to the spinal cord. No evidence of metastatic disease. Nonspecific uptake the left nipple      03/27/2015 - 03/30/2015 Hospital Admission    T4-6 decompression for spinal cord compression (lower extremity paralysis)      04/10/2015 -  Chemotherapy    Palliative treatment with Herceptin every 3 weeks with letrozole 2.5 mg daily      04/13/2015 - 05/19/2015 Radiation Therapy    Palliative radiation treatment to the spine      09/01/2015 Imaging    CT chest abdomen pelvis: Pathologic fracture with posterior fusion at T5 no other evidence of metastatic disease in the chest abdomen pelvis      12/23/2015 Imaging      CT CAP: new nonspecific 0.6 cm lymph node was stated mediastinum needs follow-up CT, innumerable tiny groundglass pulmonary nodules throughout both lungs unchanged, patchy consolidation from radiation, T5 fracture, no mets      04/15/2016 PET scan    Rt para-spinal mass mildy hypermetabolic SUV 5.9, lytic cortical lesion 4.8 cm lesion left prox femur suv 8.8 favor osseus met disease      04/28/2016 - 05/12/2016 Radiation Therapy    Palliative XRT Left femur      05/24/2016 Procedure    Soft tissue mass biopsy back: Metastatic breast cancer ER 80%, PR 0%, Ki-67 50%, HER-2 positive ratio 2.25      06/09/2016 - 03/29/2017 Anti-estrogen oral therapy    Faslodex with Herceptin and Perjeta every 4 weeks      06/13/2016 - 06/15/2016 Hospital Admission    uncomplicated revision of previous thoracic instrumentation.      10/10/2016 PET scan    Interval development of new hypermetabolic 2.5 x 1.1 cm lesion posterior right eighth rib consistent with metastatic disease, stable right paraspinal lesion at T8, clustered soft tissue nodules in the posterior midline back near cervicothoracic junction smaller than before       11/03/2016 - 11/07/2016 Radiation Therapy    Palliative radiation to right eighth rib      11/04/2016 Imaging    Solitary contrast-enhancing lesion in the right  cerebellum 6 x 2 mm concerning for metastatic lesion      11/24/2016 - 11/24/2016 Radiation Therapy    SRS to the brain lesion      04/18/2017 PET scan    New ill-defined rounded airspace opacity in posterior right lower lobe. Differential diagnosis includes malignancy and infectious/inflammatory process.Increased hypermetabolic lytic lesion in T8 vertebral body, consistent with bone metastasis.  Increased hypermetabolic posterior upper chest wall subcutaneous nodules, highly suspicious metastatic disease. Further improvement in right posterior eighth rib metastasis.      05/04/2017 - 07/27/2017 Chemotherapy    Kadcyla  every 3 weeks palliative chemotherapy stopped for progression      07/26/2017 Imaging    Interval increase in size of the multiple enhancing masses posteriorly overlying the spine, interval development of left axillary and mediastinal adenopathy, increase in the size of lytic lesions in T8-T9 and left ninth rib, increased groundglass opacities right lower lobe lung      07/27/2017 -  Chemotherapy    Palbociclib + Faslodex + trastuzumab + Pertuzumab         CHIEF COMPLIANT: Follow-up on palbociclib Faslodex Herceptin and Perjeta  INTERVAL HISTORY: Heather [name] is a 72 year old with above-mentioned history of metastatic breast cancer was currently on the palbociclib along with Faslodex.  She is also getting Herceptin and Perjeta maintenance.  She appears to be tolerating the combination treatment moderately well.  Her biggest complaint is severe fatigue.  Her [place] decreased so we had to reduce the dosage of palbociclib to 100 mg daily.  REVIEW OF SYSTEMS:   Constitutional: Denies fevers, chills or abnormal weight loss Eyes: Denies blurriness of vision Ears, nose, mouth, throat, and face: Denies mucositis or sore throat Respiratory: Denies cough, dyspnea or wheezes Cardiovascular: Denies palpitation, chest discomfort Gastrointestinal:  Denies nausea, heartburn or change in bowel habits Skin: Denies abnormal skin rashes Lymphatics: Denies new lymphadenopathy or easy bruising Neurological:Denies numbness, tingling or new weaknesses Behavioral/Psych: Mood is stable, no new changes  Extremities: No lower extremity edema Breast: Do this just call the patient is interested is called me to come back with is just to settle down with any issues which is what is doing but I think I do not going to grade: [name] works and that he works in stage IV we will try to test to demonstrate is doing 3 is a trial where he can continue the same or half the patient get this is just is in this day 85 to see  she is further the anatomic stage group to there is either 1 but he can have stage II with N0 to be higher T with grade 2 with so generally in stage II and III patient is not can tell them that there cancer okay denies any pain or lumps or nodules in either breasts All other systems were reviewed with the patient and are negative.  I have reviewed the past medical history, past surgical history, social history and family history with the patient and they are unchanged from previous note.  ALLERGIES:  is allergic to other; acyclovir and related; zanaflex [tizanidine hcl]; codeine; keflex [cephalexin]; naprosyn [naproxen]; oxycodone; tramadol; and tussin [guaifenesin].  MEDICATIONS:  Current Outpatient Medications  Medication Sig Dispense Refill  . ALPRAZolam (XANAX) 0.25 MG tablet Take 1 tablet by mouth as needed.    Marland Kitchen b complex vitamins tablet Take 1 tablet by mouth daily.    . Calcium Carbonate Antacid (TUMS PO) Take 2 tablets by mouth 2 (two) times  daily as needed (acid reflux).    . Cholecalciferol (VITAMIN D) 2000 UNITS tablet Take 2,000 Units by mouth daily.    . Coenzyme Q10 (COQ10) 100 MG CAPS Take 100 mg by mouth as needed. Days that she takes pravastatin MWF.    Marland Kitchen fluticasone (FLONASE) 50 MCG/ACT nasal spray Place 1 spray into both nostrils daily as needed.    . gabapentin (NEURONTIN) 600 MG tablet Take 1 tablet (600 mg total) by mouth as directed. Take 1 tablet in the AM and afternoon and 1 1/2 tabs at bedtime (Patient taking differently: Take 600 mg by mouth 3 (three) times daily. Take 1 tablet in the AM and afternoon and 1 1/2 tabs at bedtime) 315 tablet 2  . HYDROcodone-acetaminophen (NORCO/VICODIN) 5-325 MG tablet     . lidocaine-prilocaine (EMLA) cream Apply to affected area once (Patient taking differently: Apply 1 application topically every 30 (thirty) days. Apply to port prior to infusions) 30 g 3  . OVER THE COUNTER MEDICATION Place 1 drop into both eyes 2 (two) times daily as  needed (dry eyes). Over the counter lubricating eye drops    . palbociclib (IBRANCE) 100 MG capsule Take 1 capsule (100 mg total) by mouth daily with breakfast. Take for 21 days on, 7 days off, repeat every 28 days. Take whole with food. 21 capsule 6  . Probiotic Product (PROBIOTIC DAILY PO) Take 1 capsule by mouth daily.     . ranitidine (ZANTAC) 300 MG tablet Take 300 mg by mouth at bedtime. Reported on 06/25/2015    . spironolactone (ALDACTONE) 25 MG tablet Take 25 mg by mouth daily.     No current facility-administered medications for this visit.    Facility-Administered Medications Ordered in Other Visits  Medication Dose Route Frequency Provider Last Rate Last Dose  . heparin lock flush 100 unit/mL  500 Units Intravenous Once PRN [name], MD      . sodium chloride 0.9 % injection 10 mL  10 mL Intravenous PRN [name], MD   10 mL at 04/26/17 0828  . sodium chloride 0.9 % injection 10 mL  10 mL Intravenous PRN [name], MD        PHYSICAL EXAMINATION: ECOG PERFORMANCE STATUS: 2 - Symptomatic, <50% confined to bed  Vitals:   09/21/17 1350  BP: (!) 130/55  Pulse: 68  Resp: 17  Temp: 98.4 F (36.9 C)  SpO2: 99%   Filed Weights   09/21/17 1350  Weight: 130 lb 1.6 oz (59 kg)    GENERAL:alert, no distress and comfortable SKIN: skin color, texture, turgor are normal, no rashes or significant lesions EYES: normal, Conjunctiva are pink and non-injected, sclera clear OROPHARYNX:no exudate, no erythema and lips, buccal mucosa, and tongue normal  NECK: supple, thyroid normal size, non-tender, without nodularity LYMPH:  no palpable lymphadenopathy in the cervical, axillary or inguinal LUNGS: clear to auscultation and percussion with normal breathing effort HEART: regular rate & rhythm and no murmurs and no lower extremity edema ABDOMEN:abdomen soft, non-tender and normal bowel sounds MUSCULOSKELETAL:no cyanosis of digits and no clubbing  NEURO: alert & oriented x 3  with fluent speech, no focal motor/sensory deficits EXTREMITIES: No lower extremity edema   LABORATORY DATA:  I have reviewed the data as listed CMP Latest Ref Rng & Units 09/21/2017 09/07/2017 08/24/2017  Glucose 70 - 140 mg/dL 113 83 99  BUN 7 - 26 mg/dL _0 Creatinine 0.60 - 1.10 mg/dL 1.08 0.89 0.82  Sodium 136 - 145  mmol/L 138 140 138  Potassium 3.5 - 5.1 mmol/L 3.9 4.1 3.9  Chloride 98 - 109 mmol/L 102 104 104  CO2 22 - 29 mmol/L _0 Calcium 8.4 - 10.4 mg/dL 9.5 10.5(H) 10.0  Total Protein 6.4 - 8.3 g/dL 7.3 - -  Total Bilirubin 0.2 - 1.2 mg/dL 0.4 - -  Alkaline Phos 40 - 150 U/L 73 - -  AST 5 - 34 U/L 34 - -  ALT 0 - 55 U/L 22 - -    Lab Results  Component Value Date   WBC 1.9 (L) 09/21/2017   HGB 12.0 09/21/2017   HCT 36.9 09/21/2017   MCV 88.5 09/21/2017   PLT 113 (L) 09/21/2017   NEUTROABS 1.1 (L) 09/21/2017    ASSESSMENT & PLAN:  Primary cancer of upper outer quadrant of right female breast ([facility]) Right breast invasive ductal carcinoma ER positive PR negative HER-2 positive Ki-67 44% multifocal disease 3/7 lymph nodes positive T2 N1 M0 stage IIB status post adjuvant chemotherapy with TCH followed by Herceptin maintenance, adjuvant radiation therapy and Letrozole. MRI 02/14/2015: T5 large destructive lesion with pathologic compression fracture and extensive epidural tumor, involvement of T4, excision metastatic carcinoma ER 60%, PR 0%, HER-2 positive ratio 2.71 average copy #7.45 03/27/2015: T4-6 decompression surgery for spinal cord compression PET-CT 12/10/17Rt para-spinal mass mildy hypermetabolic SUV 5.9, lytic cortical lesion 4.8 cm lesion left prox femur suv 8.8 favor osseus met disease Echo 04/12/2017 shows well preserved ejection fraction of 55-60%   Treatment summary: 1. Resumed anastrozole 05/21/2015, but it has caused significant hair loss so we switched her to exemestane 25 mg once daily 07/23/2015 2. Herceptin every 3 week started  04/10/2015 3. Bone metastases: Zometa every 3 months-next duelate May/early June 4. Radiation therapy to the spine (Dr. [name]) 04/13/2015 to 05/19/2015 5. Radiation therapy to left femur 04/28/2016 to 05/12/2016  6. SRS to the brain lesion 11/24/2016 7.Kadcyla started 05/05/2017 discontinued 07/27/2017 -------------------------------------------------------------------------------------------------------------------------- Current treatment:Palbociclib +Faslodex +trastuzumab+ Pertuzumabstarted 08/10/2017  Toxicities of palbociclib: 1.   Neutropenia: Decrease dosage to 100 mg palbociclib, today's ANC is 1.1 2. fatigue: Much improved since the dose was reduced  Thoracic MRI report: Subcutaneous nodules appear to be getting smaller Brain MRI report pending  Faslodex and Trastuzumab, Pertuzumab every 4 weeks. F/u in 2 weeks with labs prior.   No orders of the defined types were placed in this encounter.  The patient has a good understanding of the overall plan. she agrees with it. she will call with any problems that may develop before the next visit here.   Harriette Ohara, MD 09/21/17

## 2017-09-21 NOTE — Progress Notes (Signed)
Labs drawn by this RN from PIV site in [facility] per patients request.  SIte D/Ced after draw. Site is clean with no swelling or redness noted.

## 2017-09-26 ENCOUNTER — Ambulatory Visit
Admission: RE | Admit: 2017-09-26 | Discharge: 2017-09-26 | Disposition: A | Discharge status: Home / Self Care | Payer: Medicare Other | Source: Ambulatory Visit | Attending: Urology | Admitting: Urology

## 2017-09-26 ENCOUNTER — Other Ambulatory Visit: Payer: Self-pay

## 2017-09-26 ENCOUNTER — Encounter: Payer: Self-pay | Admitting: Urology

## 2017-09-26 VITALS — BP 141/66 | HR 75 | Temp 98.2°F | Resp 18 | Ht 64.0 in | Wt 128.8 lb

## 2017-09-26 DIAGNOSIS — Z79899 Other long term (current) drug therapy: Secondary | ICD-10-CM | POA: Insufficient documentation

## 2017-09-26 DIAGNOSIS — C7931 Secondary malignant neoplasm of brain: Secondary | ICD-10-CM

## 2017-09-26 DIAGNOSIS — C7800 Secondary malignant neoplasm of unspecified lung: Secondary | ICD-10-CM | POA: Diagnosis not present

## 2017-09-26 DIAGNOSIS — F419 Anxiety disorder, unspecified: Secondary | ICD-10-CM | POA: Insufficient documentation

## 2017-09-26 DIAGNOSIS — I10 Essential (primary) hypertension: Secondary | ICD-10-CM | POA: Insufficient documentation

## 2017-09-26 DIAGNOSIS — Z9011 Acquired absence of right breast and nipple: Secondary | ICD-10-CM | POA: Insufficient documentation

## 2017-09-26 DIAGNOSIS — L989 Disorder of the skin and subcutaneous tissue, unspecified: Secondary | ICD-10-CM | POA: Insufficient documentation

## 2017-09-26 DIAGNOSIS — C50411 Malignant neoplasm of upper-outer quadrant of right female breast: Secondary | ICD-10-CM | POA: Insufficient documentation

## 2017-09-26 DIAGNOSIS — Z85828 Personal history of other malignant neoplasm of skin: Secondary | ICD-10-CM | POA: Diagnosis not present

## 2017-09-26 DIAGNOSIS — Z806 Family history of leukemia: Secondary | ICD-10-CM | POA: Insufficient documentation

## 2017-09-26 DIAGNOSIS — Z800 Family history of malignant neoplasm of digestive organs: Secondary | ICD-10-CM | POA: Insufficient documentation

## 2017-09-26 DIAGNOSIS — Z8042 Family history of malignant neoplasm of prostate: Secondary | ICD-10-CM | POA: Insufficient documentation

## 2017-09-26 DIAGNOSIS — Z923 Personal history of irradiation: Secondary | ICD-10-CM | POA: Insufficient documentation

## 2017-09-26 DIAGNOSIS — K449 Diaphragmatic hernia without obstruction or gangrene: Secondary | ICD-10-CM | POA: Insufficient documentation

## 2017-09-26 DIAGNOSIS — R42 Dizziness and giddiness: Secondary | ICD-10-CM | POA: Diagnosis not present

## 2017-09-26 DIAGNOSIS — E785 Hyperlipidemia, unspecified: Secondary | ICD-10-CM | POA: Insufficient documentation

## 2017-09-26 DIAGNOSIS — Z87442 Personal history of urinary calculi: Secondary | ICD-10-CM | POA: Insufficient documentation

## 2017-09-26 DIAGNOSIS — G629 Polyneuropathy, unspecified: Secondary | ICD-10-CM | POA: Diagnosis not present

## 2017-09-26 DIAGNOSIS — C7951 Secondary malignant neoplasm of bone: Secondary | ICD-10-CM | POA: Diagnosis not present

## 2017-09-26 DIAGNOSIS — Z08 Encounter for follow-up examination after completed treatment for malignant neoplasm: Secondary | ICD-10-CM | POA: Diagnosis not present

## 2017-09-26 DIAGNOSIS — R911 Solitary pulmonary nodule: Secondary | ICD-10-CM | POA: Diagnosis not present

## 2017-09-26 NOTE — Progress Notes (Signed)
Radiation Oncology         [phone] ________________________________  Name: Heather [name] MRN: [id number]  Date: 09/26/2017  DOB: [birth year]  Post-treatment Note  CC: [name], MD  [name], MD  Diagnosis:   72 y.o. woman with ER positive, PR negative, HER-2 positive metastatic breast cancer with multifocal metastasis involving the spine, bony skeleton, lung, and brain.   Interval Since Last Radiation: 4 months   05/18/2017 - 05/31/2017:  The thoracic spine (T7-T9) was treated to 30 Gy in 10 fractions of 3 Gy.  6/27-7/18/18 SRS and palliative treatment: 1.  The rib 8th was treated to 30 Gy in 10 fractions 2.  Right cerebellar 6 mm target was treated using 3 Dynamic Conformal Arcs to a prescription dose of 20 Gy in one fraction  04/27/16-05/12/16: Left femur/ 30 Gy in 10 fractions  04/13/2015-05/19/2015 Postop:  55 Gy in 25 fractions of 2.2 Gy to the thoracic spine  10/25/12-12/11/12: 50.4 Gy to the right chest wall and regional lymph nodes in 28 fractions with a 10 Gy boost to the right chest wall in 1 session.  Narrative: Heather [name] is a well known patient to our service with a history of metastatic breast cancer originally treated in 2014 to the chest wall and regional lymph nodes. She developed recurrent disease within the thoracic spine and subsequently has undergone 3 surgeries, in addition to radiotherapy, with her most recent surgery being on 06/13/2016 where she had removal of thoracic hardware and replacement of pedicle screws with stabilization.  Of note previously placed screws between T3, T4 T6 and T7 were identified and dissection was carried out inferiorly to T8, T9-T10 and T11, T8 through T11 pedicle screw trajectories were placed bilaterally. She recovered well since treatment to her spine as she had been paralyzed in 2016, and with rigorous therapy has been able to make an impressive recovery. She continues on systemic therapy under the care of [name] and  also follows along in the brain and spine oncology conference. She was previously on Faslodex, Perjeta, and Herceptin systemic therapy since March of 2018, and had a repeat PET scan on 10/10/16 which revealed stable to smaller soft tissue nodules in the posterior thorax near the cervicothoracic junction and stable hypermetabolism in the previously treated right paraspinal disease at T8 but there was a new hypermetabolic 2.5 x 1.1 cm lesion in the posterior right 8th rib with an SUV of 8.9. She also mentioned having new onset headaches, dizziness, and an episode of visual change,so an MRI brain was performed 11/04/16 demonstrating a solitary contrast-enhancing lesion within the right cerebellum, measuring 6 x 2 mm and lying just beneath the right tentorial leaflet and concerning for a metastatic lesion.  This solitary lesion was confirmed with SRS protocol MRI brain on 11/16/17 with no new lesions noted. This lesion was treated with SRS on 11/23/16 which she tolerated very well. She also completed palliative radiotherapy to the right eight rib from 11/02/17 - 11/23/17 with significant improvement in her pain.   She had follow up imaging with MRI of the brain and thoracic spine on 03/15/17. Her case and imaging studies were reviewed at the multidisciplinary brain and spine conference 03/20/17 and were felt to be overall stable except for some progressive nodular enhancing soft tissue masses in the subcutaneous tissues posteriorly at the level of prior surgery and felt compatible with metastatic disease. The decision at that time was to continue to monitor the RLL mass to assess response to  chemotherapy and consider radiotherapy to that lesion only if no response or poor response to systemic therapy.  The right cerebellar metastatic disease was no longer visualized and there were no new lesions in the brain.   She remained on Herceptin, Perjeta, Faslodex and Zometa for systemic disease under the care and direction of Dr.  [name].  She developed increased pain in the T8 region and also had PET positive findings in this region, as well as a new right lower lobe mass concerning for progressive metastatic disease on follow up imaging 04/18/2017, despite her current systemic treatment. Her systemic therapy was switched to Kadcyla on 05/05/17 with discontinuation of Faslodex, Herceptin, and Perjeta.  She proceeded with palliative radiotherapy to 30 Gy in 10 fractions to the thoracic spine (T7-T9) from 05/18/2017 - 05/31/2017.  This treatment was tolerated very well.  She continued on the Kadcyla, which she tolerated relatively well. She also remained on Zometa q 3 month for bone metastasis. Repeat brain MRI performed 06/15/17 showed no new or progressive contrast enhancing lesions and no residual abnormality at the site of the previous treated right cerebellar lesion.    Follow up Chest CT on 07/26/17 showed clear disease progression with interval increase in size of the multiple enhancing masses posteriorly overlying the spine, interval development of left axillary and mediastinal adenopathy, increase in the size of lytic lesions in T8-T9 and left ninth rib, as well as increased groundglass opacities right lower lobe lung. Therefore, the Kadcyla was discontinued after 3 cycles and she was switched to Palbociclib + Faslodex + trastuzumab + Pertuzumab, started 08/10/17. She has toelrated her first 2 cycles relatively well with the exception of extreme fatigue due to significant neutropenia.  The fatigue has improved since her dosage of palbociclib was reduced.   INTERVAL HISTORY:  She has had recent follow up imaging with MRI bran and MRI T-spine on 09/21/17.  MRI brain shows a stable right frontal calvarial metastasis but enlarging RIGHT parietal calvarial metastasis measuring 51m, was 459m  No intracranial metastasis or acute intracranial process. On thoracic MRI, the known T12 lesion has slightly progressed by a few millimeters from  03/15/17 and there is a new lesion at L2 which was not present on lumbar MRI from 07/13/16. Otherwise, disease at T8-T9 remains stable and there is decreased size of subcutaneous nodules over the upper thoracic spine.  On review of systems, the patient reports that she is doing well overall.  She continues with modest fatigue which is gradually improving.  She has also noticed improvement in the burning/tingling pain that radiates from the left aspect of the spine at the T8-9 site and radiates underneath her breast since completing radiotherapy.  She has not had recent headaches, changes in visual or auditory acuity, tinnitus, increased imbalance, tremor or seizure activity.  She denies any sternal chest pain, shortness of breath, cough, fevers, chills, night sweats, or unintended weight changes. She denies any bowel or bladder disturbances, and denies abdominal pain, nausea or vomiting. She denies any new musculoskeletal or joint aches or pains, new skin lesions or concerns. She has noted a decrease in size in the subcutaneous nodules on her back.  A complete review of systems is obtained and is otherwise negative.  Past Medical History:  Past Medical History:  Diagnosis Date  . Anemia    hx of  . Anxiety    r/t updated surgery  . [place] ([facility]   [place]  . Cataract    immature-not sure of  which eye  . Complication of anesthesia    pt states she is sensitive to meds  . Dizziness    has been going on for 23month;medical MD aware  . Genetic testing 07/11/2016   Test Results: Normal; No pathogenic mutations detected  Genes Analyzed: 43 genes on [employer] Common Cancers panel (APC, ATM, AXIN2, BARD1, BMPR1A, BRCA1, BRCA2, BRIP1, CDH1, CDKN2A, CHEK2, DICER1, EPCAM, GREM1, HOXB13, KIT, MEN1, MLH1, MSH2, MSH6, MUTYH, NBN, NF1, PALB2, PDGFRA, PMS2, POLD1, POLE, PTEN, RAD50, RAD51C, RAD51D, SDHA, SDHB, SDHC, SDHD, SMAD4, SMARCA4, STK11, TP53, TSC1, TSC2, VHL).  .Marland KitchenGERD (gastroesophageal reflux disease)     Tums prn  . H. pylori infection   . H/O hiatal hernia   . Hemorrhoids   . History of UTI   . Hx of radiation therapy 10/25/12- 12/11/12   right chest wall/regional lymph nodes 5040 cGy, 28 sessions, right chest wall boost 1000 cGy 1 session  . Hx: UTI (urinary tract infection)   . Hyperlipidemia    but not on meds;diet and exercise controlled  . Hypertension    recently started Aldactone   . Insomnia    takes Melatoniin daily  . Metastasis to spinal column (HCC)    T5, Left  Femur cancer- radiation   . Neuropathy    "from back surgery"  . PONV (postoperative nausea and vomiting)    pt experienced hair loss, confusion and combative- 02/15/15  . Right knee pain   . Right shoulder pain   . Skin cancer    squamous, Nodule to back - "same cancer as breast."  . Urinary frequency    d/t taking Aldactone    Past Surgical History: Past Surgical History:  Procedure Laterality Date  . COLONOSCOPY    . cyst removed from left breast  1970  . DECOMPRESSIVE LUMBAR LAMINECTOMY LEVEL 4 N/A 02/15/2015   Procedure: DECOMPRESSION T5 AND T3-T7 STABALIZATION;  Surgeon: NConsuella Lose MD;  [facility];  Service: Neurosurgery;  Laterality: N/A;  . ESOPHAGOGASTRODUODENOSCOPY    . LAMINECTOMY N/A 03/27/2015   Procedure: Thoracic Four-Thoracic Six Laminectomy for tumor;  Surgeon: NConsuella Lose MD;  [facility];  Service: Neurosurgery;  Laterality: N/A;  T4-T6 Laminectomy  . left wrist surgery   2004   with plate  . MASTECTOMY MODIFIED RADICAL  05/10/2012   Procedure: MASTECTOMY MODIFIED RADICAL;  Surgeon: PMerrie Roof MD;  [facility]  Service: General;  Laterality: Right;  RIGHT MODIFIED RADICAL MASTECTOMY  . MASTECTOMY, RADICAL Right   . MINOR BREAST BIOPSY Left 05/16/2016   Procedure: EXCISION OF BACK NODULE;  Surgeon: PAutumn MessingIII, MD;  [facility]  Service: General;  Laterality: Left;  . Nodule  back  05/17/2015  . PORTACATH  PLACEMENT  05/10/2012   Procedure: INSERTION PORT-A-CATH;  Surgeon: PMerrie Roof MD;  [facility];  Service: General;  Laterality: Left;    Social History:  Social History   Socioeconomic History  . Marital status: Married    Spouse name: Not on file  . Number of children: 2  . Years of education: Not on file  . Highest education level: Not on file  Occupational History  . Not on file  Social Needs  . Financial resource strain: Not on file  . Food insecurity:    Worry: Not on file    Inability: Not on file  . Transportation needs:    Medical: Not on file    Non-medical: Not  on file  Tobacco Use  . Smoking status: Never Smoker  . Smokeless tobacco: Never Used  Substance and Sexual Activity  . Alcohol use: No  . Drug use: No  . Sexual activity: Yes    Birth control/protection: Post-menopausal  Lifestyle  . Physical activity:    Days per week: Not on file    Minutes per session: Not on file  . Stress: Not on file  Relationships  . Social connections:    Talks on phone: Not on file    Gets together: Not on file    Attends religious service: Not on file    Active member of club or organization: Not on file    Attends meetings of clubs or organizations: Not on file    Relationship status: Not on file  . Intimate partner violence:    Fear of current or ex partner: Not on file    Emotionally abused: Not on file    Physically abused: Not on file    Forced sexual activity: Not on file  Other Topics Concern  . Not on file  Social History Narrative  . Not on file    Family History: Family History  Problem Relation Age of Onset  . Leukemia Mother 41       deceased 89  . Stroke Father   . Diabetes Mellitus II Father   . Prostate cancer Brother 79       currently 55  . Stomach cancer Maternal Grandmother 107       deceased 36  . Prostate cancer Brother 36       currently 75     ALLERGIES:  is allergic to other; acyclovir and related; zanaflex [tizanidine  hcl]; codeine; keflex [cephalexin]; naprosyn [naproxen]; oxycodone; tramadol; and tussin [guaifenesin].  Meds: Current Outpatient Medications  Medication Sig Dispense Refill  . ALPRAZolam (XANAX) 0.25 MG tablet Take 1 tablet by mouth as needed.    Marland Kitchen b complex vitamins tablet Take 1 tablet by mouth daily.    . Calcium Carbonate Antacid (TUMS PO) Take 2 tablets by mouth 2 (two) times daily as needed (acid reflux).    . Cholecalciferol (VITAMIN D) 2000 UNITS tablet Take 2,000 Units by mouth daily.    . Coenzyme Q10 (COQ10) 100 MG CAPS Take 100 mg by mouth as needed. Days that she takes pravastatin MWF.    Marland Kitchen fluticasone (FLONASE) 50 MCG/ACT nasal spray Place 1 spray into both nostrils daily as needed.    . gabapentin (NEURONTIN) 600 MG tablet Take 1 tablet (600 mg total) by mouth as directed. Take 1 tablet in the AM and afternoon and 1 1/2 tabs at bedtime (Patient taking differently: Take 600 mg by mouth 3 (three) times daily. Take 1 tablet in the AM and afternoon and 1 1/2 tabs at bedtime) 315 tablet 2  . HYDROcodone-acetaminophen (NORCO/VICODIN) 5-325 MG tablet     . lidocaine-prilocaine (EMLA) cream Apply to affected area once (Patient taking differently: Apply 1 application topically every 30 (thirty) days. Apply to port prior to infusions) 30 g 3  . OVER THE COUNTER MEDICATION Place 1 drop into both eyes 2 (two) times daily as needed (dry eyes). Over the counter lubricating eye drops    . palbociclib (IBRANCE) 100 MG capsule Take 1 capsule (100 mg total) by mouth daily with breakfast. Take for 21 days on, 7 days off, repeat every 28 days. Take whole with food. 21 capsule 6  . Probiotic Product (PROBIOTIC DAILY PO) Take 1 capsule  by mouth daily.     . ranitidine (ZANTAC) 300 MG tablet Take 300 mg by mouth at bedtime. Reported on 06/25/2015    . spironolactone (ALDACTONE) 25 MG tablet Take 25 mg by mouth daily.     No current facility-administered medications for this encounter.     Facility-Administered Medications Ordered in Other Encounters  Medication Dose Route Frequency Provider Last Rate Last Dose  . sodium chloride 0.9 % injection 10 mL  10 mL Intravenous PRN [name], MD   10 mL at 04/26/17 5329    Physical Findings:  height is '5\' 4"'  (1.626 m) and weight is 128 lb 12.8 oz (58.4 kg). Her oral temperature is 98.2 F (36.8 C). Her blood pressure is 141/66 (abnormal) and her pulse is 75. Her respiration is 18 and oxygen saturation is 99%.  Pain Assessment Pain Score: 0-No pain/10 In general this is a well appearing Caucasian female in no acute distress. She's alert and oriented x4 and appropriate throughout the examination. Cardiopulmonary assessment is negative for acute distress and she exhibits normal effort. She appears grossly neurologically intact.  PERRLA.  EOMs are intact bilaterally. Speech is clear and well organized, without evidence of difficulty with word finding.  There are palpable subcutaneous nodules bilaterally along T3-T4 and bilaterally at T8-T9 which are appreciably decreased in size from last exam.  She is non-tender to palpate midline over T12 - L2.  She has FROM of bilateral upper and lower extremities.  Sensation is intact to light touch and strength is 5/5 and equal in the upper and lower extremities.  There is a dark purple/black slightly raised macular lesion just behind her right ear that she reports having felt several months ago but did not pay any attention to it because it was not painful or enlarging.  She has not had this evaluated.   Lab Findings: Lab Results  Component Value Date   WBC 1.9 (L) 09/21/2017   HGB 12.0 09/21/2017   HCT 36.9 09/21/2017   MCV 88.5 09/21/2017   PLT 113 (L) 09/21/2017     Radiographic Findings: Mr Jeri Cos JM Contrast  Result Date: 09/21/2017 CLINICAL DATA:  Restaging, treated RIGHT cerebellar breast metastasis. EXAM: MRI HEAD WITHOUT AND WITH CONTRAST TECHNIQUE: Multiplanar, multiecho pulse  sequences of the brain and surrounding structures were obtained without and with intravenous contrast. CONTRAST:  61m MULTIHANCE GADOBENATE DIMEGLUMINE 529 MG/ML IV SOLN COMPARISON:  MRI of the head June 15, 2017 FINDINGS: INTRACRANIAL CONTENTS: No reduced diffusion to suggest acute ischemia or hypercellular tumor. No susceptibility artifact to suggest hemorrhage. The ventricles and sulci are normal for patient's age. Scattered supratentorial subcentimeter white matter FLAIR T2 hyperintensities compatible with mild chronic small vessel ischemic disease, less than expected for age. No suspicious parenchymal signal, masses, mass effect. No abnormal intraparenchymal or extra-axial enhancement. No abnormal extra-axial fluid collections. No extra-axial masses. VASCULAR: Normal major intracranial vascular flow voids present at skull base. SKULL AND UPPER CERVICAL SPINE: No abnormal sellar expansion. 7 mm reduced diffusion RIGHT parietal calvarium with enhancement was 4 mm. Mild FLAIR T2 hyperintense adjacent signal, similar. Stable small RIGHT frontal calvarial lesion. Craniocervical junction maintained. SINUSES/ORBITS: The mastoid air-cells and included paranasal sinuses are well-aerated.The included ocular globes and orbital contents are non-suspicious. OTHER: None. IMPRESSION: 1. Enlarging RIGHT parietal calvarial suspected metastasis. Stable RIGHT frontal calvarial metastasis. 2. No intracranial metastasis or acute intracranial process. Electronically Signed   By: CElon AlasM.D.   On: 09/21/2017 15:56   Mr Thoracic Spine  W Wo Contrast  Result Date: 09/21/2017 CLINICAL DATA:  Follow-up treated bone metastases.  Breast cancer. EXAM: MRI THORACIC WITHOUT AND WITH CONTRAST TECHNIQUE: Multiplanar and multiecho pulse sequences of the thoracic spine were obtained without and with intravenous contrast. CONTRAST:  48m MULTIHANCE GADOBENATE DIMEGLUMINE 529 MG/ML IV SOLN COMPARISON:  Chest CT 07/26/2017.   Thoracic MRI 0 03/15/2017 FINDINGS: MRI THORACIC SPINE FINDINGS Alignment: Exaggerated thoracic kyphosis with T5-6 anterolisthesis, chronic Vertebrae: T2-T11 posterior rod and pedicle screw fixation with screws sparing T5, T6, and T7. There is chronic vertebral plana at T5 with posterior decompression at this level. T12 body metastasis measuring 14 mm, previously 10 mm. There is a metastasis within the L2 inferior body measuring 9 mm, level not covered previously, new from lumbar MRI 07/13/2016. Infiltrative mass within the T8 body, known from prior, with mild endplate height loss. There is T9 posterior body erosion by recent chest CT, tumor not visible on this scan due to hardware artifact. Cord: Significantly limited due to artifact from hardware. No visible intrathecal metastasis Paraspinal and other soft tissues: Generalized decreased bulk of multiple nodules within the subcutaneous fat over the upper thoracic spine. I ll-defined shape limiting reproducible size measurement. Known lung opacity in the right lower lobe. Neighboring pleural thickening is also re- demonstrated. Stable soft tissue mass right of the aorta at the hiatus, 11 mm on axial slices. Disc levels: Level no notable degenerative changes. IMPRESSION: 1. T12 body metastasis that has increased in size by few millimeters from 03/15/2017. 2. L2 body metastasis not covered on most recent MRI, new from 07/13/2016 lumbar MRI. 3. T8 and T9 body metastases. The T8 metastasis was noted on prior study. Vertebral body erosion has increased at these levels by CT. 4. Decreased size of subcutaneous nodules over the upper thoracic spine. Electronically Signed   By: JMonte FantasiaM.D.   On: 09/21/2017 13:49    Impression/Plan: [age]yo woman with ER positive breast cancer with a solitary brain metastasis, bony metastatses and concerning pulmonary nodule.  Regarding her metastatic brain disease, she appears to be clinically and radiographically stable with  recent brain MRI from 09/21/2017 showing no new or progressive contrast enhancing intracranial metastasis or acute intracranial process and no residual abnormality at the site of the previous treated right cerebellar lesion. There is an enlarging right parietal calvarial metastasis noted measuring measuring 716m was 15m46mnd a stable right frontal calvarial metastasis.  As she is currently asymptomatic regarding the calvarial lesions, we will continue to monitor her closely with serial brain MRI scans every 3 months and office follow-up thereafter to review results.  Regarding her systemic disease, she will continue in routine follow-up for disease management under the care and direction of Dr. GudLindi Adieth whom she has an upcoming appointment on 10/12/2017.  We are hopeful that these lesions will respond th her current systemic therapy. 2. Spine metastases.  We reviewed findings on her recent thoracic imaging showing minimal enlargement of the known T12 lesion by a few millimeters from 03/15/17 as well as a new lesion at L2 which was not present on lumbar MRI from 07/13/16. Otherwise, previously treated disease at T8-T9 remains stable and there is decreased size of subcutaneous nodules over the upper thoracic spine. She is not currently symptomatic, so, as per consensus recommendation from multidisciplinary conference 09/25/17, we will continue with observation and will alternate further spine imaging for surveillance with PET scans as ordered by Dr. GudLindi AdieShe will be due for repeat thoracic  imaging in late August so we will coordinate this with her upcoming brain imaging. 3. Hypermetabolic right lower lobe mass, 4 cm.  This was identified on recent PET scan from April 18, 2017.  The decision at that time was to make a change in her systemic therapy and hold off on radiotherapy until further assessment of her treatment response.  Follow up chest CT from 07/26/17 did show mild interval increase in the ill-defined,  ground-glass and consolidative opacities but her systemic therapy was recently changed and the decision again was to hold off on further treatment to assess treatment. Should this lesion progress despite systemic therapy or the patient become symptomatic, we would be happy to offer radiotherapy. 4. Skin lesion behind right ear.  This lesion is concerning in appearnace and we are uncertain as to the length of time it has been present.  Therefore, the patient was advised to have this evaluated with her dermatologist for consideration of biopsy.      Nicholos Johns, PA-C

## 2017-09-27 ENCOUNTER — Telehealth: Payer: Self-pay | Admitting: Pharmacy Technician

## 2017-09-27 NOTE — Telephone Encounter (Signed)
Oral Oncology Patient Advocate Encounter  Was successful in securing patient an additional $ 8000 grant from [facility] [place] (PAF) to provide continued copayment coverage for her Ibrance.  This will keep the out of pocket expense at $0.    I have spoken with the patient.    The billing information is as follows and has been shared with [place].   Member ID: [id number] Group ID: [id number] RxBin: [id number] Dates of Eligibility: 03/29/2017 through 09/26/2018  Fabio Asa. Melynda Keller, [place] [facility] [place] [phone] 09/27/2017 10:20 AM

## 2017-09-28 NOTE — Telephone Encounter (Signed)
Oral Oncology Patient Advocate Encounter  Prior Authorization for Heather [name] has been approved.    PA# [id number] Effective dates: 07/28/2017 through 01/28/2018  Oral Oncology Clinic will continue to follow.

## 2017-10-05 ENCOUNTER — Telehealth: Payer: Self-pay | Admitting: *Deleted

## 2017-10-05 ENCOUNTER — Ambulatory Visit: Payer: Medicare Other | Admitting: Hematology and Oncology

## 2017-10-05 ENCOUNTER — Other Ambulatory Visit: Payer: Medicare Other

## 2017-10-05 ENCOUNTER — Ambulatory Visit: Payer: Medicare Other

## 2017-10-05 NOTE — Telephone Encounter (Signed)
This RN spoke with the patient later in the afternoon after MD left for the day- this RN wanted to follow up with pt's concerns as well as to tell her MD will review note in am and call will be returned regarding restarting the Ibrance.  Per further discussion - Heather [name] states " the nose bleeds have gotten better but it is the fatigue that bothers me the most "  Heather [name] states fatigue started approximately 2 days ago " and is just like nothing I have experience before "  She states she is able to perform her ADL's " but as soon as I sit down the fatigue just takes over "  Per inquiry by this RN and possible need to check labs tomorrow - Heather [name] would prefer not to come in due to " my husband has made other plans "  " I could come Monday but then I would just prefer to wait until Thursday "  Plan per call is this note will be reviewed with MD and pt called in AM.

## 2017-10-05 NOTE — Telephone Encounter (Signed)
Received call from pt asking about her Ibrance.  She has been off 7 days today & wants to know if she should start back today of wait this thurs when she comes in for labs/flush/MD/Rx.  She states she normally has labs every 2 wks but her appts were changed.  It will be 5 wks since last infusion & 3 wks since lab work done.  She reports fatigue which was worse yest pm & nose bleeds-mostly when she blows her nose with an occasional drip & small bruises on her legs & some on her arms.  Informed message will be sent to Dr [name] & his nurse should contact her.

## 2017-10-06 NOTE — Telephone Encounter (Signed)
Per review with MD - recommended to not start Ibrance until visit 10/12/2017.  This RN contacted Heather [name] and informed her of above with Chanay verbalizing understanding.

## 2017-10-09 ENCOUNTER — Other Ambulatory Visit: Payer: Self-pay

## 2017-10-09 ENCOUNTER — Encounter (HOSPITAL_COMMUNITY): Payer: Self-pay | Admitting: Internal Medicine

## 2017-10-09 ENCOUNTER — Ambulatory Visit (HOSPITAL_BASED_OUTPATIENT_CLINIC_OR_DEPARTMENT_OTHER)
Admission: RE | Admit: 2017-10-09 | Discharge: 2017-10-09 | Disposition: A | Discharge status: Home / Self Care | Payer: Medicare Other | Source: Ambulatory Visit | Attending: Internal Medicine | Admitting: Internal Medicine

## 2017-10-09 ENCOUNTER — Ambulatory Visit (HOSPITAL_COMMUNITY)
Admission: RE | Admit: 2017-10-09 | Discharge: 2017-10-09 | Disposition: A | Discharge status: Home / Self Care | Payer: Medicare Other | Source: Ambulatory Visit | Attending: Internal Medicine | Admitting: Internal Medicine

## 2017-10-09 VITALS — BP 145/61 | HR 71 | Wt 128.8 lb

## 2017-10-09 DIAGNOSIS — Z9011 Acquired absence of right breast and nipple: Secondary | ICD-10-CM | POA: Diagnosis not present

## 2017-10-09 DIAGNOSIS — C7951 Secondary malignant neoplasm of bone: Secondary | ICD-10-CM | POA: Diagnosis not present

## 2017-10-09 DIAGNOSIS — Z7951 Long term (current) use of inhaled steroids: Secondary | ICD-10-CM | POA: Diagnosis not present

## 2017-10-09 DIAGNOSIS — Z806 Family history of leukemia: Secondary | ICD-10-CM | POA: Diagnosis not present

## 2017-10-09 DIAGNOSIS — G4700 Insomnia, unspecified: Secondary | ICD-10-CM | POA: Insufficient documentation

## 2017-10-09 DIAGNOSIS — Z833 Family history of diabetes mellitus: Secondary | ICD-10-CM | POA: Insufficient documentation

## 2017-10-09 DIAGNOSIS — Z881 Allergy status to other antibiotic agents status: Secondary | ICD-10-CM | POA: Insufficient documentation

## 2017-10-09 DIAGNOSIS — Z923 Personal history of irradiation: Secondary | ICD-10-CM | POA: Insufficient documentation

## 2017-10-09 DIAGNOSIS — I10 Essential (primary) hypertension: Secondary | ICD-10-CM

## 2017-10-09 DIAGNOSIS — Z9221 Personal history of antineoplastic chemotherapy: Secondary | ICD-10-CM | POA: Diagnosis not present

## 2017-10-09 DIAGNOSIS — K219 Gastro-esophageal reflux disease without esophagitis: Secondary | ICD-10-CM | POA: Diagnosis not present

## 2017-10-09 DIAGNOSIS — Z886 Allergy status to analgesic agent status: Secondary | ICD-10-CM | POA: Insufficient documentation

## 2017-10-09 DIAGNOSIS — Z79899 Other long term (current) drug therapy: Secondary | ICD-10-CM | POA: Insufficient documentation

## 2017-10-09 DIAGNOSIS — C50911 Malignant neoplasm of unspecified site of right female breast: Secondary | ICD-10-CM

## 2017-10-09 DIAGNOSIS — Z853 Personal history of malignant neoplasm of breast: Secondary | ICD-10-CM | POA: Diagnosis not present

## 2017-10-09 DIAGNOSIS — E785 Hyperlipidemia, unspecified: Secondary | ICD-10-CM | POA: Diagnosis not present

## 2017-10-09 DIAGNOSIS — Z888 Allergy status to other drugs, medicaments and biological substances status: Secondary | ICD-10-CM | POA: Diagnosis not present

## 2017-10-09 DIAGNOSIS — Z823 Family history of stroke: Secondary | ICD-10-CM | POA: Diagnosis not present

## 2017-10-09 DIAGNOSIS — Z885 Allergy status to narcotic agent status: Secondary | ICD-10-CM | POA: Insufficient documentation

## 2017-10-09 DIAGNOSIS — Z87440 Personal history of urinary (tract) infections: Secondary | ICD-10-CM | POA: Diagnosis not present

## 2017-10-09 NOTE — Patient Instructions (Signed)
We will contact you in 6 months to schedule your next appointment and echocardiogram  

## 2017-10-09 NOTE — Progress Notes (Signed)
CARDIO-ONCOLOGY CLINIC NOTE  Patient ID: Heather [name], female   DOB: [birth year], 72 y.o.   MRN: [id number]  Referring Oncologist: Dr Heather [name] Oncologistt: Dr Heather [name] PCP: Dr Heather [name] General Surgeon: Dr Heather [name]  HPI: Heather [name] is a 72 year old female with invasive ductal carcinoma in R breast that is ER positive and HER-2/neu positive,  S/P R mastectomy and lymph node dissection 05/10/12.    She has been treated for HTN in the past but stopped taking the medications due to elevated renal function. Intolerant Ace-I due to cough.    She received a total of 6 cycles of Taxotere/carboplatin starting on 06/14/2012 and finishing in May. XRT finished in August 2014. Now receiving Herceptin every 3 weeks to end at the end of February 2015.    She finished initial Herceptin 2015 but unfortunately then developed pathological vertebral fracture with cord compression and paralysis. Now back on Herceptin since 12/16.  Found to have metastatic lesion to left femur. Underwent XRT.   Subsequently found to have a met to her 8th rib. Perjeta added to Herceptin starting in March 2018. Also getting Faslodex injections.    Here for f/u. We saw her in December and she had multiple new cancerous nodules. Remains on Herceptin/Perjeta and now on Ibranz & Faslodex. Tolerating chemo well. Recent MRI with slight interval progression of mets. Breathing ok. Able to get around and do ADLs. Dizziness improved with stone repositioning.   Echo today EF 55-60%  GLS -23.5% Personally reviewed  Echo 12/18 EF 55-60% Grade I DD GLS -21.3% LS 7.4 cm/s (poor window)  SUMMARY OF ONCOLOGIC HISTORY:    Primary cancer of upper outer quadrant of right female breast ([place])   03/08/2012 Initial Diagnosis    Right breast invasive ductal carcinoma ER positive PR negative HER-2 positive Ki-67 44%; another breast mass biopsied in the anterior part of the breast which was also positive for malignancy that was HER-2 negative       05/10/2012 Surgery    Right mastectomy and axillary lymph node dissection: Multifocal disease 5 cm, 1.7 cm, 1.6 cm, ER positive PR negative HER-2 positive Ki-67 44%, 3/7 lymph nodes positive      06/14/2012 - 06/12/2013 Chemotherapy    Adjuvant chemotherapy with [place] 6 followed by Herceptin maintenance      10/25/2012 - 12/11/2012 Radiation Therapy    Adjuvant radiation therapy      02/07/2013 -  Anti-estrogen oral therapy    Letrozole 2.5 mg daily      02/14/2015 Imaging    MRI spine: Large destructive T5 lesion with severe pathologic compression fracture and extensive epidural tumor, severe spinal stenosis with moderate cord compression, tumor involvement of T4      03/18/2015 PET scan    Residual enhancing soft tissue adjacent to the spinal cord. No evidence of metastatic disease. Nonspecific uptake the left nipple      03/27/2015 - 03/30/2015 Hospital Admission    T4-6 decompression for spinal cord compression (lower extremity paralysis)      04/10/2015 -  Chemotherapy    Palliative treatment with Herceptin every 3 weeks with letrozole 2.5 mg daily      04/13/2015 - 05/19/2015 Radiation Therapy    Palliative radiation treatment to the spine      09/01/2015 Imaging    CT chest abdomen pelvis: Pathologic fracture with posterior fusion at T5 no other evidence of metastatic disease in the chest abdomen pelvis      12/23/2015 Imaging    CT CAP:  new nonspecific 0.6 cm lymph node was stated mediastinum needs follow-up CT, innumerable tiny groundglass pulmonary nodules throughout both lungs unchanged, patchy consolidation from radiation, T5 fracture, no mets      04/15/2016 PET scan    Rt para-spinal mass mildy hypermetabolic SUV 5.9, lytic cortical lesion 4.8 cm lesion left prox femur suv 8.8 favor osseus met disease      04/28/2016 - 05/12/2016 Radiation Therapy    Palliative XRT Left femur      05/24/2016 Procedure     Soft tissue mass biopsy back: Metastatic breast cancer ER 80%, PR 0%, Ki-67 50%, HER-2 positive ratio 2.25      06/09/2016 -  Anti-estrogen oral therapy    Faslodex with Herceptin and Perjeta every 4 weeks      06/13/2016 - 06/15/2016 Hospital Admission    uncomplicated revision of previous thoracic instrumentation.        03/28/12 ECHO 60-65% Lateral S' 9.3 09/17/12 ECHO 55% lateral s' 9.4 03/05/13 ECHO EF 55% lateral s' 11.1 1/15 ECHO EF 60-65%, lateral s' 11.3, global longitudinal strain -22.9% 8/15 Echo EF 60% lateral s 11.2, GLS -25% 10/07/2015  ECHO EF 55-60% laterals s' 8.7 cm/s GLS -21.1% mild MR. Grade I DD 01/07/2016 Echo EF 55-60% GLS -21.2% Lateral s' 10.2 cm/s Mild MR/TR Grade I DD  04/29/16 Echo EF 60-65% lateral s' 9.6 cm/s GLS -17.9% (but poor tracking) If use 2 images with good tracking it is (21.4%)  11/02/16 Echo EF 60-65% GLS -22.13 Lat s' 10.7  Labs (1/15): K 3.9, creatinine 1.0 Labs  (7/15) K 4.2 Cr 1.2  Past Medical History:  Diagnosis Date  . Anemia    hx of  . Anxiety    r/t updated surgery  . Breast cancer ([place])    right  . Cataract    immature-not sure of which eye  . Complication of anesthesia    pt states she is sensitive to meds  . Dizziness    has been going on for 48month;medical MD aware  . Genetic testing 07/11/2016   Test Results: Normal; No pathogenic mutations detected  Genes Analyzed: 43 genes on [employer] Common Cancers panel (APC, ATM, AXIN2, BARD1, BMPR1A, BRCA1, BRCA2, BRIP1, CDH1, CDKN2A, CHEK2, DICER1, EPCAM, GREM1, HOXB13, KIT, MEN1, MLH1, MSH2, MSH6, MUTYH, NBN, NF1, PALB2, PDGFRA, PMS2, POLD1, POLE, PTEN, RAD50, RAD51C, RAD51D, SDHA, SDHB, SDHC, SDHD, SMAD4, SMARCA4, STK11, TP53, TSC1, TSC2, VHL).  .Marland KitchenGERD (gastroesophageal reflux disease)    Tums prn  . H. pylori infection   . H/O hiatal hernia   . Hemorrhoids   . History of UTI   . Hx of radiation therapy 10/25/12- 12/11/12   right chest wall/regional lymph nodes 5040 cGy,  28 sessions, right chest wall boost 1000 cGy 1 session  . Hx: UTI (urinary tract infection)   . Hyperlipidemia    but not on meds;diet and exercise controlled  . Hypertension    recently started Aldactone   . Insomnia    takes Melatoniin daily  . Metastasis to spinal column ([facility])    T5, Left  Femur cancer- radiation   . Neuropathy    "from back surgery"  . PONV (postoperative nausea and vomiting)    pt experienced hair loss, confusion and combative- 02/15/15  . Right knee pain   . Right shoulder pain   . Skin cancer    squamous, Nodule to back - "same cancer as breast."  . Urinary frequency    d/t taking Aldactone  Current Outpatient Medications  Medication Sig Dispense Refill  . ALPRAZolam (XANAX) 0.25 MG tablet Take 1 tablet by mouth as needed.    Marland Kitchen b complex vitamins tablet Take 1 tablet by mouth daily.    . Calcium Carbonate Antacid (TUMS PO) Take 2 tablets by mouth 2 (two) times daily as needed (acid reflux).    . Cholecalciferol (VITAMIN D) 2000 UNITS tablet Take 2,000 Units by mouth daily.    . Coenzyme Q10 (COQ10) 100 MG CAPS Take 100 mg by mouth as needed. Days that she takes pravastatin MWF.    Marland Kitchen fluticasone (FLONASE) 50 MCG/ACT nasal spray Place 1 spray into both nostrils daily as needed.    . gabapentin (NEURONTIN) 600 MG tablet Take 1 tablet (600 mg total) by mouth as directed. Take 1 tablet in the AM and afternoon and 1 1/2 tabs at bedtime (Patient taking differently: Take 600 mg by mouth 3 (three) times daily. Take 1 tablet in the AM and afternoon and 1 1/2 tabs at bedtime) 315 tablet 2  . HYDROcodone-acetaminophen (NORCO/VICODIN) 5-325 MG tablet     . lidocaine-prilocaine (EMLA) cream Apply to affected area once (Patient taking differently: Apply 1 application topically every 30 (thirty) days. Apply to port prior to infusions) 30 g 3  . OVER THE COUNTER MEDICATION Place 1 drop into both eyes 2 (two) times daily as needed (dry eyes). Over the counter lubricating eye  drops    . palbociclib (IBRANCE) 100 MG capsule Take 1 capsule (100 mg total) by mouth daily with breakfast. Take for 21 days on, 7 days off, repeat every 28 days. Take whole with food. 21 capsule 6  . Probiotic Product (PROBIOTIC DAILY PO) Take 1 capsule by mouth daily.     . ranitidine (ZANTAC) 300 MG tablet Take 300 mg by mouth at bedtime. Reported on 06/25/2015    . spironolactone (ALDACTONE) 25 MG tablet Take 25 mg by mouth daily.     No current facility-administered medications for this encounter.    Facility-Administered Medications Ordered in Other Encounters  Medication Dose Route Frequency Provider Last Rate Last Dose  . sodium chloride 0.9 % injection 10 mL  10 mL Intravenous PRN [name], MD   10 mL at 04/26/17 0828     Allergies  Allergen Reactions  . Other Itching    Chlorhexadine Cloth wipes  . Acyclovir And Related   . Zanaflex [Tizanidine Hcl] Hives    Bumps  . Codeine     " loopy".   02/26/15: Also patient does not want to take oxycodone either (makes her feel like a "zombie")  . Keflex [Cephalexin]     GI Upset  . Naprosyn [Naproxen] Other (See Comments)    GI ipset   . Oxycodone Other (See Comments)    Made her feel like a "zombie" 02/25/15. Patient does not want to take oxycodone.  . Tramadol Itching  . Tussin [Guaifenesin] Other (See Comments)    Dizzy     Social History   Socioeconomic History  . Marital status: Married    Spouse name: Not on file  . Number of children: 2  . Years of education: Not on file  . Highest education level: Not on file  Occupational History  . Not on file  Social Needs  . Financial resource strain: Not on file  . Food insecurity:    Worry: Not on file    Inability: Not on file  . Transportation needs:    Medical: Not on  file    Non-medical: Not on file  Tobacco Use  . Smoking status: Never Smoker  . Smokeless tobacco: Never Used  Substance and Sexual Activity  . Alcohol use: No  . Drug use: No  . Sexual  activity: Yes    Birth control/protection: Post-menopausal  Lifestyle  . Physical activity:    Days per week: Not on file    Minutes per session: Not on file  . Stress: Not on file  Relationships  . Social connections:    Talks on phone: Not on file    Gets together: Not on file    Attends religious service: Not on file    Active member of club or organization: Not on file    Attends meetings of clubs or organizations: Not on file    Relationship status: Not on file  . Intimate partner violence:    Fear of current or ex partner: Not on file    Emotionally abused: Not on file    Physically abused: Not on file    Forced sexual activity: Not on file  Other Topics Concern  . Not on file  Social History Narrative  . Not on file    Family History  Problem Relation Age of Onset  . Leukemia Mother 85       deceased 60  . Stroke Father   . Diabetes Mellitus II Father   . Prostate cancer Brother 71       currently 36  . Stomach cancer Maternal Grandmother 23       deceased 58  . Prostate cancer Brother 25       currently 75    PHYSICAL EXAM: Vitals:   10/09/17 1055  BP: (!) 145/61  Pulse: 71  SpO2: 99%   General:  Well appearing. No resp difficulty HEENT: normal Neck: supple. no JVD. Carotids 2+ bilat; no bruits. No lymphadenopathy or thryomegaly appreciated. Cor: PMI nondisplaced. Regular rate & rhythm. No rubs, gallops or murmurs. RIJ port-a-cath Lungs: clear Abdomen: soft, nontender, nondistended. No hepatosplenomegaly. No bruits or masses. Good bowel sounds. Extremities: no cyanosis, clubbing, rash, edema + compression hose  Neuro: alert & orientedx3, cranial nerves grossly intact. moves all 4 extremities w/o difficulty. Affect pleasant   ASSESSMENT & PLAN: 1. R breast CA with bone mets -initial Herceptin completed in 2015. -Palliative Herceptin start 12/16. Added Perjeta in 3/18 - Started Andria Frames early 2019  - I reviewed echos personally. EF and Doppler  parameters stable. No HF on exam. Continue Herceptin.  - No signs of cardio-toxicity continue screening every 6 months 2. HTN:  - stable. Continue current regimen.     Kishia Shackett,MD 11:19 AM

## 2017-10-09 NOTE — Progress Notes (Signed)
  Echocardiogram 2D Echocardiogram has been performed.  Jennette Dubin 10/09/2017, 10:46 AM

## 2017-10-09 NOTE — Addendum Note (Signed)
Encounter addended by: Scarlette Calico, RN on: 10/09/2017 11:33 AM  Actions taken: Sign clinical note

## 2017-10-09 NOTE — Addendum Note (Signed)
Encounter addended by: Scarlette Calico, RN on: 10/09/2017 11:37 AM  Actions taken: Visit diagnoses modified, Order list changed, Diagnosis association updated

## 2017-10-12 ENCOUNTER — Telehealth: Payer: Self-pay | Admitting: Hematology and Oncology

## 2017-10-12 ENCOUNTER — Inpatient Hospital Stay: Payer: Medicare Other

## 2017-10-12 ENCOUNTER — Inpatient Hospital Stay (HOSPITAL_BASED_OUTPATIENT_CLINIC_OR_DEPARTMENT_OTHER): Payer: Medicare Other | Admitting: Hematology and Oncology

## 2017-10-12 ENCOUNTER — Inpatient Hospital Stay: Payer: Medicare Other | Attending: Hematology and Oncology

## 2017-10-12 DIAGNOSIS — C773 Secondary and unspecified malignant neoplasm of axilla and upper limb lymph nodes: Secondary | ICD-10-CM

## 2017-10-12 DIAGNOSIS — Z923 Personal history of irradiation: Secondary | ICD-10-CM | POA: Diagnosis not present

## 2017-10-12 DIAGNOSIS — C7931 Secondary malignant neoplasm of brain: Secondary | ICD-10-CM

## 2017-10-12 DIAGNOSIS — C7951 Secondary malignant neoplasm of bone: Secondary | ICD-10-CM

## 2017-10-12 DIAGNOSIS — Z79811 Long term (current) use of aromatase inhibitors: Secondary | ICD-10-CM

## 2017-10-12 DIAGNOSIS — D709 Neutropenia, unspecified: Secondary | ICD-10-CM

## 2017-10-12 DIAGNOSIS — Z9221 Personal history of antineoplastic chemotherapy: Secondary | ICD-10-CM | POA: Insufficient documentation

## 2017-10-12 DIAGNOSIS — C50411 Malignant neoplasm of upper-outer quadrant of right female breast: Secondary | ICD-10-CM | POA: Diagnosis not present

## 2017-10-12 DIAGNOSIS — Z170 Estrogen receptor positive status [ER+]: Secondary | ICD-10-CM | POA: Diagnosis not present

## 2017-10-12 DIAGNOSIS — R5383 Other fatigue: Secondary | ICD-10-CM

## 2017-10-12 DIAGNOSIS — C7989 Secondary malignant neoplasm of other specified sites: Secondary | ICD-10-CM

## 2017-10-12 DIAGNOSIS — Z9011 Acquired absence of right breast and nipple: Secondary | ICD-10-CM

## 2017-10-12 DIAGNOSIS — Z5112 Encounter for antineoplastic immunotherapy: Secondary | ICD-10-CM | POA: Diagnosis not present

## 2017-10-12 DIAGNOSIS — Z5111 Encounter for antineoplastic chemotherapy: Secondary | ICD-10-CM | POA: Diagnosis not present

## 2017-10-12 LAB — CMP (CANCER CENTER ONLY)
ALT: 24 U/L (ref 0–55)
AST: 41 U/L — ABNORMAL HIGH (ref 5–34)
Albumin: 4.1 g/dL (ref 3.5–5.0)
Alkaline Phosphatase: 81 U/L (ref 40–150)
Anion gap: 8 (ref 3–11)
BUN: 13 mg/dL (ref 7–26)
CO2: 28 mmol/L (ref 22–29)
Calcium: 9.9 mg/dL (ref 8.4–10.4)
Chloride: 101 mmol/L (ref 98–109)
Creatinine: 0.85 mg/dL (ref 0.60–1.10)
GFR, Est AFR Am: >60 mL/min (ref >60)
GFR, Estimated: >60 mL/min (ref >60)
Glucose, Bld: 86 mg/dL (ref 70–140)
Potassium: 4 mmol/L (ref 3.5–5.1)
Sodium: 137 mmol/L (ref 136–145)
Total Bilirubin: 0.4 mg/dL (ref 0.2–1.2)
Total Protein: 7.5 g/dL (ref 6.4–8.3)

## 2017-10-12 LAB — CBC WITH DIFFERENTIAL (CANCER CENTER ONLY)
Basophils Absolute: 0.1 10*3/uL (ref 0.0–0.1)
Basophils Relative: 4 %
Eosinophils Absolute: 0.1 10*3/uL (ref 0.0–0.5)
Eosinophils Relative: 2 %
HCT: 35.9 % (ref 34.8–46.6)
Hemoglobin: 12.1 g/dL (ref 11.6–15.9)
Lymphocytes Relative: 16 %
Lymphs Abs: 0.5 10*3/uL — ABNORMAL LOW (ref 0.9–3.3)
MCH: 30.5 pg (ref 25.1–34.0)
MCHC: 33.7 g/dL (ref 31.5–36.0)
MCV: 90.6 fL (ref 79.5–101.0)
Monocytes Absolute: 0.4 10*3/uL (ref 0.1–0.9)
Monocytes Relative: 13 %
Neutro Abs: 2.1 10*3/uL (ref 1.5–6.5)
Neutrophils Relative %: 65 %
Platelet Count: 199 10*3/uL (ref 145–400)
RBC: 3.96 MIL/uL (ref 3.70–5.45)
RDW: 26.1 % — ABNORMAL HIGH (ref 11.2–14.5)
WBC Count: 3.2 10*3/uL — ABNORMAL LOW (ref 3.9–10.3)

## 2017-10-12 MED ORDER — SODIUM CHLORIDE 0.9 % IV SOLN
420.0000 mg | Freq: Once | INTRAVENOUS | Status: AC
  Administered 2017-10-12: 420 mg via INTRAVENOUS
  Filled 2017-10-12: qty 14

## 2017-10-12 MED ORDER — FULVESTRANT 250 MG/5ML IM SOLN
500.0000 mg | Freq: Once | INTRAMUSCULAR | Status: AC
  Administered 2017-10-12: 500 mg via INTRAMUSCULAR

## 2017-10-12 MED ORDER — ZOLEDRONIC ACID 4 MG/100ML IV SOLN
4.0000 mg | Freq: Once | INTRAVENOUS | Status: AC
  Administered 2017-10-12: 4 mg via INTRAVENOUS
  Filled 2017-10-12: qty 100

## 2017-10-12 MED ORDER — ACETAMINOPHEN 325 MG PO TABS
650.0000 mg | ORAL_TABLET | Freq: Once | ORAL | Status: AC
  Administered 2017-10-12: 650 mg via ORAL

## 2017-10-12 MED ORDER — ACETAMINOPHEN 325 MG PO TABS
ORAL_TABLET | ORAL | Status: AC
  Filled 2017-10-12: qty 2

## 2017-10-12 MED ORDER — HEPARIN SOD (PORK) LOCK FLUSH 100 UNIT/ML IV SOLN
500.0000 [IU] | Freq: Once | INTRAVENOUS | Status: AC | PRN
  Administered 2017-10-12: 500 [IU]
  Filled 2017-10-12: qty 5

## 2017-10-12 MED ORDER — FULVESTRANT 250 MG/5ML IM SOLN
INTRAMUSCULAR | Status: AC
  Filled 2017-10-12: qty 10

## 2017-10-12 MED ORDER — SODIUM CHLORIDE 0.9 % IV SOLN
Freq: Once | INTRAVENOUS | Status: AC
  Administered 2017-10-12: 12:00:00 via INTRAVENOUS

## 2017-10-12 MED ORDER — SODIUM CHLORIDE 0.9 % IJ SOLN
10.0000 mL | INTRAMUSCULAR | Status: DC | PRN
  Administered 2017-10-12: 10 mL via INTRAVENOUS
  Filled 2017-10-12: qty 10

## 2017-10-12 MED ORDER — SODIUM CHLORIDE 0.9% FLUSH
10.0000 mL | INTRAVENOUS | Status: DC | PRN
  Administered 2017-10-12: 10 mL
  Filled 2017-10-12: qty 10

## 2017-10-12 MED ORDER — TRASTUZUMAB CHEMO 150 MG IV SOLR
6.0000 mg/kg | Freq: Once | INTRAVENOUS | Status: AC
  Administered 2017-10-12: 357 mg via INTRAVENOUS
  Filled 2017-10-12: qty 17

## 2017-10-12 NOTE — Patient Instructions (Signed)
[facility] Discharge Instructions for Patients Receiving Chemotherapy  Today you received the following chemotherapy agents :  Herceptin, Perjeta,  Zometa,  Faslodex.  To help prevent nausea and vomiting after your treatment, we encourage you to take your nausea medication as prescribed.   If you develop nausea and vomiting that is not controlled by your nausea medication, call the clinic.   BELOW ARE SYMPTOMS THAT SHOULD BE REPORTED IMMEDIATELY:  *FEVER GREATER THAN 100.5 F  *CHILLS WITH OR WITHOUT FEVER  NAUSEA AND VOMITING THAT IS NOT CONTROLLED WITH YOUR NAUSEA MEDICATION  *UNUSUAL SHORTNESS OF BREATH  *UNUSUAL BRUISING OR BLEEDING  TENDERNESS IN MOUTH AND THROAT WITH OR WITHOUT PRESENCE OF ULCERS  *URINARY PROBLEMS  *BOWEL PROBLEMS  UNUSUAL RASH Items with * indicate a potential emergency and should be followed up as soon as possible.  Feel free to call the clinic should you have any questions or concerns. The clinic phone number is [phone].  Please show the [facility] at check-in to the Emergency Department and triage nurse.

## 2017-10-12 NOTE — Assessment & Plan Note (Signed)
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 MRI 02/14/2015: T5 large destructive lesion with pathologic compression fracture and extensive epidural tumor, involvement of T4, excision metastatic carcinoma ER 60%, PR 0%, HER-2 positive ratio 2.71 average copy #7.45 03/27/2015: T4-6 decompression surgery for spinal cord compression PET-CT 12/10/17Rt para-spinal mass mildy hypermetabolic SUV 5.9, lytic cortical lesion 4.8 cm lesion left prox femur suv 8.8 favor osseus met disease Echo 04/12/2017 shows well preserved ejection fraction of 55-60%   Treatment summary: 1. Resumed anastrozole 05/21/2015, but it has caused significant hair loss so we switched her to exemestane 25 mg once daily 07/23/2015 2. Herceptin every 3 week started 04/10/2015 3. Bone metastases: Zometa every 3 months-next duelate May/early June 4. Radiation therapy to the spine (Dr. Manning) 04/13/2015 to 05/19/2015 5. Radiation therapy to left femur 04/28/2016 to 05/12/2016  6. SRS to the brain lesion 11/24/2016 7.Kadcyla started 05/05/2017 discontinued 07/27/2017 -------------------------------------------------------------------------------------------------------------------------- Current treatment:Palbociclib +Faslodex +trastuzumab+ Pertuzumabstarted 08/10/2017  Toxicities of palbociclib: 1.  Neutropenia: Decrease dosage to 100 mg palbociclib, today's ANC is 1.1 2. fatigue: Much improved since the dose was reduced  Thoracic MRI report: Subcutaneous nodules appear to be getting smaller Brain MRI report 09/21/2017: Enlarging right parietal calvarial suspected metastases.  Stable right frontal calvarial metastases no intracranial mass.  Faslodex and Trastuzumab, Pertuzumabevery 4 weeks.  

## 2017-10-12 NOTE — Telephone Encounter (Signed)
Gave patient avs and calendar of upcoming July appointments

## 2017-10-12 NOTE — Progress Notes (Signed)
Patient Care Team: [name], MD as PCP - General (Family Medicine)  DIAGNOSIS:  Encounter Diagnosis  Name Primary?  . Primary cancer of upper outer quadrant of right female breast ([place])     SUMMARY OF ONCOLOGIC HISTORY:   Primary cancer of upper outer quadrant of right female breast ([place])   03/08/2012 Initial Diagnosis    Right breast invasive ductal carcinoma ER positive PR negative HER-2 positive Ki-67 44%; another breast mass biopsied in the anterior part of the breast which was also positive for malignancy that was HER-2 negative      05/10/2012 Surgery    Right mastectomy and axillary lymph node dissection: Multifocal disease 5 cm, 1.7 cm, 1.6 cm, ER positive PR negative HER-2 positive Ki-67 44%, 3/7 lymph nodes positive      06/14/2012 - 06/12/2013 Chemotherapy    Adjuvant chemotherapy with Braymer 6 followed by Herceptin maintenance      10/25/2012 - 12/11/2012 Radiation Therapy    Adjuvant radiation therapy      02/07/2013 -  Anti-estrogen oral therapy    Letrozole 2.5 mg daily      02/14/2015 Imaging    MRI spine: Large destructive T5 lesion with severe pathologic compression fracture and extensive epidural tumor, severe spinal stenosis with moderate cord compression, tumor involvement of T4      03/18/2015 PET scan    Residual enhancing soft tissue adjacent to the spinal cord. No evidence of metastatic disease. Nonspecific uptake the left nipple      03/27/2015 - 03/30/2015 Hospital Admission    T4-6 decompression for spinal cord compression (lower extremity paralysis)      04/10/2015 -  Chemotherapy    Palliative treatment with Herceptin every 3 weeks with letrozole 2.5 mg daily      04/13/2015 - 05/19/2015 Radiation Therapy    Palliative radiation treatment to the spine      09/01/2015 Imaging    CT chest abdomen pelvis: Pathologic fracture with posterior fusion at T5 no other evidence of metastatic disease in the chest abdomen pelvis      12/23/2015 Imaging      CT CAP: new nonspecific 0.6 cm lymph node was stated mediastinum needs follow-up CT, innumerable tiny groundglass pulmonary nodules throughout both lungs unchanged, patchy consolidation from radiation, T5 fracture, no mets      04/15/2016 PET scan    Rt para-spinal mass mildy hypermetabolic SUV 5.9, lytic cortical lesion 4.8 cm lesion left prox femur suv 8.8 favor osseus met disease      04/28/2016 - 05/12/2016 Radiation Therapy    Palliative XRT Left femur      05/24/2016 Procedure    Soft tissue mass biopsy back: Metastatic breast cancer ER 80%, PR 0%, Ki-67 50%, HER-2 positive ratio 2.25      06/09/2016 - 03/29/2017 Anti-estrogen oral therapy    Faslodex with Herceptin and Perjeta every 4 weeks      06/13/2016 - 06/15/2016 Hospital Admission    uncomplicated revision of previous thoracic instrumentation.      10/10/2016 PET scan    Interval development of new hypermetabolic 2.5 x 1.1 cm lesion posterior right eighth rib consistent with metastatic disease, stable right paraspinal lesion at T8, clustered soft tissue nodules in the posterior midline back near cervicothoracic junction smaller than before       11/03/2016 - 11/07/2016 Radiation Therapy    Palliative radiation to right eighth rib      11/04/2016 Imaging    Solitary contrast-enhancing lesion in the right  cerebellum 6 x 2 mm concerning for metastatic lesion      11/24/2016 - 11/24/2016 Radiation Therapy    SRS to the brain lesion      04/18/2017 PET scan    New ill-defined rounded airspace opacity in posterior right lower lobe. Differential diagnosis includes malignancy and infectious/inflammatory process.Increased hypermetabolic lytic lesion in T8 vertebral body, consistent with bone metastasis.  Increased hypermetabolic posterior upper chest wall subcutaneous nodules, highly suspicious metastatic disease. Further improvement in right posterior eighth rib metastasis.      05/04/2017 - 07/27/2017 Chemotherapy    Kadcyla  every 3 weeks palliative chemotherapy stopped for progression      07/26/2017 Imaging    Interval increase in size of the multiple enhancing masses posteriorly overlying the spine, interval development of left axillary and mediastinal adenopathy, increase in the size of lytic lesions in T8-T9 and left ninth rib, increased groundglass opacities right lower lobe lung      07/27/2017 -  Chemotherapy    Palbociclib + Faslodex + trastuzumab + Pertuzumab         CHIEF COMPLIANT: Improvement in nosebleeds, improvement in the fatigue.  INTERVAL HISTORY: Heather [name] is a 72 year old with above-mentioned metastatic breast cancer who is on palbociclib along with Faslodex and Herceptin and Perjeta.  She had to get 2 weeks break because of profound fatigue.  We had dose reduced her for palbociclib from 125-100.  She felt markedly better with the extra week off.  She still has profound fatigue but it is not as much as before.  Her pain is under good control.  REVIEW OF SYSTEMS:   Constitutional: Denies fevers, chills or abnormal weight loss Eyes: Denies blurriness of vision Ears, nose, mouth, throat, and face: Denies mucositis or sore throat Respiratory: Denies cough, dyspnea or wheezes Cardiovascular: Denies palpitation, chest discomfort Gastrointestinal:  Denies nausea, heartburn or change in bowel habits Skin: Denies abnormal skin rashes Lymphatics: Denies new lymphadenopathy or easy bruising Neurological:Denies numbness, tingling or new weaknesses Behavioral/Psych: Mood is stable, no new changes  Extremities: No lower extremity edema All other systems were reviewed with the patient and are negative.  I have reviewed the past medical history, past surgical history, social history and family history with the patient and they are unchanged from previous note.  ALLERGIES:  is allergic to other; acyclovir and related; zanaflex [tizanidine hcl]; codeine; keflex [cephalexin]; naprosyn [naproxen];  oxycodone; tramadol; and tussin [guaifenesin].  MEDICATIONS:  Current Outpatient Medications  Medication Sig Dispense Refill  . ALPRAZolam (XANAX) 0.25 MG tablet Take 1 tablet by mouth as needed.    Marland Kitchen b complex vitamins tablet Take 1 tablet by mouth daily.    . Calcium Carbonate Antacid (TUMS PO) Take 2 tablets by mouth 2 (two) times daily as needed (acid reflux).    . Cholecalciferol (VITAMIN D) 2000 UNITS tablet Take 2,000 Units by mouth daily.    . Coenzyme Q10 (COQ10) 100 MG CAPS Take 100 mg by mouth as needed. Days that she takes pravastatin MWF.    Marland Kitchen fluticasone (FLONASE) 50 MCG/ACT nasal spray Place 1 spray into both nostrils daily as needed.    . gabapentin (NEURONTIN) 600 MG tablet Take 1 tablet (600 mg total) by mouth as directed. Take 1 tablet in the AM and afternoon and 1 1/2 tabs at bedtime (Patient taking differently: Take 600 mg by mouth 3 (three) times daily. Take 1 tablet in the AM and afternoon and 1 1/2 tabs at bedtime) 315 tablet 2  .  HYDROcodone-acetaminophen (NORCO/VICODIN) 5-325 MG tablet     . lidocaine-prilocaine (EMLA) cream Apply to affected area once (Patient taking differently: Apply 1 application topically every 30 (thirty) days. Apply to port prior to infusions) 30 g 3  . OVER THE COUNTER MEDICATION Place 1 drop into both eyes 2 (two) times daily as needed (dry eyes). Over the counter lubricating eye drops    . palbociclib (IBRANCE) 100 MG capsule Take 1 capsule (100 mg total) by mouth daily with breakfast. Take for 21 days on, 7 days off, repeat every 28 days. Take whole with food. 21 capsule 6  . Probiotic Product (PROBIOTIC DAILY PO) Take 1 capsule by mouth daily.     . ranitidine (ZANTAC) 300 MG tablet Take 300 mg by mouth at bedtime. Reported on 06/25/2015    . spironolactone (ALDACTONE) 25 MG tablet Take 25 mg by mouth daily.     No current facility-administered medications for this visit.    Facility-Administered Medications Ordered in Other Visits    Medication Dose Route Frequency Provider Last Rate Last Dose  . fulvestrant (FASLODEX) injection 500 mg  500 mg Intramuscular Once [name], MD      . heparin lock flush 100 unit/mL  500 Units Intracatheter Once PRN [name], MD      . pertuzumab (PERJETA) 420 mg in sodium chloride 0.9 % 250 mL chemo infusion  420 mg Intravenous Once [name], MD      . sodium chloride 0.9 % injection 10 mL  10 mL Intravenous PRN [name], MD   10 mL at 04/26/17 0828  . sodium chloride flush (NS) 0.9 % injection 10 mL  10 mL Intracatheter PRN [name], MD      . trastuzumab (HERCEPTIN) 357 mg in sodium chloride 0.9 % 250 mL chemo infusion  6 mg/kg (Treatment Plan Recorded) Intravenous Once [name], MD 534 mL/hr at 10/12/17 1249 357 mg at 10/12/17 1249    PHYSICAL EXAMINATION: ECOG PERFORMANCE STATUS: 2 - Symptomatic, <50% confined to bed  Vitals:   10/12/17 1018  BP: 138/69  Pulse: 65  Resp: 18  Temp: (!) 97.5 F (36.4 C)  SpO2: 99%   Filed Weights   10/12/17 1018  Weight: 129 lb 1.6 oz (58.6 kg)    GENERAL:alert, no distress and comfortable SKIN: skin color, texture, turgor are normal, no rashes or significant lesions EYES: normal, Conjunctiva are pink and non-injected, sclera clear OROPHARYNX:no exudate, no erythema and lips, buccal mucosa, and tongue normal  NECK: supple, thyroid normal size, non-tender, without nodularity LYMPH:  no palpable lymphadenopathy in the cervical, axillary or inguinal LUNGS: clear to auscultation and percussion with normal breathing effort HEART: regular rate & rhythm and no murmurs and no lower extremity edema ABDOMEN:abdomen soft, non-tender and normal bowel sounds MUSCULOSKELETAL:no cyanosis of digits and no clubbing  NEURO: alert & oriented x 3 with fluent speech, no focal motor/sensory deficits EXTREMITIES: No lower extremity edema  LABORATORY DATA:  I have reviewed the data as listed CMP Latest Ref Rng & Units 10/12/2017  09/21/2017 09/07/2017  Glucose 70 - 140 mg/dL 86 113 83  BUN 7 - 26 mg/dL _0 Creatinine 0.60 - 1.10 mg/dL 0.85 1.08 0.89  Sodium 136 - 145 mmol/L 137 138 140  Potassium 3.5 - 5.1 mmol/L 4.0 3.9 4.1  Chloride 98 - 109 mmol/L 101 102 104  CO2 22 - 29 mmol/L _1 Calcium 8.4 - 10.4 mg/dL 9.9 9.5 10.5(H)  Total Protein  6.4 - 8.3 g/dL 7.5 7.3 -  Total Bilirubin 0.2 - 1.2 mg/dL 0.4 0.4 -  Alkaline Phos 40 - 150 U/L 81 73 -  AST 5 - 34 U/L 41(H) 34 -  ALT 0 - 55 U/L 24 22 -    Lab Results  Component Value Date   WBC 3.2 (L) 10/12/2017   HGB 12.1 10/12/2017   HCT 35.9 10/12/2017   MCV 90.6 10/12/2017   PLT 199 10/12/2017   NEUTROABS 2.1 10/12/2017    ASSESSMENT & PLAN:  Primary cancer of upper outer quadrant of right female breast ([place]) Right breast invasive ductal carcinoma ER positive PR negative HER-2 positive Ki-67 44% multifocal disease 3/7 lymph nodes positive T2 N1 M0 stage IIB status post adjuvant chemotherapy with TCH followed by Herceptin maintenance, adjuvant radiation therapy and Letrozole. MRI 02/14/2015: T5 large destructive lesion with pathologic compression fracture and extensive epidural tumor, involvement of T4, excision metastatic carcinoma ER 60%, PR 0%, HER-2 positive ratio 2.71 average copy #7.45 03/27/2015: T4-6 decompression surgery for spinal cord compression PET-CT 12/10/17Rt para-spinal mass mildy hypermetabolic SUV 5.9, lytic cortical lesion 4.8 cm lesion left prox femur suv 8.8 favor osseus met disease Echo 04/12/2017 shows well preserved ejection fraction of 55-60%   Treatment summary: 1. Resumed anastrozole 05/21/2015, but it has caused significant hair loss so we switched her to exemestane 25 mg once daily 07/23/2015 2. Herceptin every 3 week started 04/10/2015 3. Bone metastases: Zometa every 3 months-next duelate May/early June 4. Radiation therapy to the spine (Dr. [name]) 04/13/2015 to 05/19/2015 5. Radiation therapy to left femur  04/28/2016 to 05/12/2016  6. SRS to the brain lesion 11/24/2016 7.Kadcyla started 05/05/2017 discontinued 07/27/2017 -------------------------------------------------------------------------------------------------------------------------- Current treatment:Palbociclib +Faslodex +trastuzumab+ Pertuzumabstarted 08/10/2017  Toxicities of palbociclib: 1.  Neutropenia: Decrease dosage to 100 mg palbociclib, today's ANC is 2.1  We decided to change her treatment plan to 3 weeks on and 2 weeks off.  We will also plan on infusions every 5 weeks. 2. fatigue: Much improved since the dose was reduced  Thoracic MRI report: Subcutaneous nodules appear to be getting smaller Brain MRI report 09/21/2017: Enlarging right parietal calvarial suspected metastases.  Stable right frontal calvarial metastases no intracranial mass.  Faslodex and Trastuzumab, Pertuzumabevery 5 weeks.   Orders Placed This Encounter  Procedures  . CT Abdomen Pelvis W Contrast    Standing Status:   Future    Standing Expiration Date:   10/12/2018    Order Specific Question:   If indicated for the ordered procedure, I authorize the administration of contrast media per Radiology protocol    Answer:   Yes    Order Specific Question:   Preferred imaging location?    Answer:   [facility]    Order Specific Question:   Is Oral Contrast requested for this exam?    Answer:   Yes, Per Radiology protocol    Order Specific Question:   Radiology Contrast Protocol - do NOT remove file path    Answer:   \\[email or web address]    Order Specific Question:   Reason for Exam additional comments    Answer:   Metastatic breast cancer restaging. Please scan upto Knee (femur lesion)  . CT Chest W Contrast    Standing Status:   Future    Standing Expiration Date:   10/12/2018    Order Specific Question:   If indicated for the ordered procedure, I authorize the administration of contrast media per Radiology  protocol    Answer:  Yes    Order Specific Question:   Preferred imaging location?    Answer:   [facility]    Order Specific Question:   Radiology Contrast Protocol - do NOT remove file path    Answer:   [email or web address]    Order Specific Question:   Reason for Exam additional comments    Answer:   Metastatic breast cancer restaging   The patient has a good understanding of the overall plan. she agrees with it. she will call with any problems that may develop before the next visit here.   Harriette Ohara, MD 10/12/17

## 2017-10-18 ENCOUNTER — Other Ambulatory Visit: Payer: Self-pay | Admitting: Hematology and Oncology

## 2017-10-18 ENCOUNTER — Other Ambulatory Visit: Payer: Self-pay

## 2017-10-18 DIAGNOSIS — C50911 Malignant neoplasm of unspecified site of right female breast: Secondary | ICD-10-CM

## 2017-10-18 DIAGNOSIS — C7951 Secondary malignant neoplasm of bone: Principal | ICD-10-CM

## 2017-10-18 MED ORDER — PALBOCICLIB 100 MG PO CAPS: 100.0000 mg | ORAL_CAPSULE | Freq: Every day | ORAL | 6 refills | Status: DC

## 2017-10-18 NOTE — Telephone Encounter (Signed)
Per Dr.Gudena, pt to take reduced dose of Ibrance 100mg  daily for 21 days and off for 14 days. Repeat every 35 days. Sent a new prescription to [facility].

## 2017-10-19 MED FILL — IBRANCE 100 MG CAPSULE: 100 | 28 days supply | Qty: 21 | Fill #0

## 2017-11-02 ENCOUNTER — Ambulatory Visit: Payer: Medicare Other

## 2017-11-02 ENCOUNTER — Other Ambulatory Visit: Payer: Medicare Other

## 2017-11-02 ENCOUNTER — Ambulatory Visit: Payer: Medicare Other | Admitting: Hematology and Oncology

## 2017-11-08 ENCOUNTER — Ambulatory Visit: Payer: Medicare Other | Admitting: Hematology and Oncology

## 2017-11-08 ENCOUNTER — Other Ambulatory Visit: Payer: Medicare Other

## 2017-11-08 ENCOUNTER — Ambulatory Visit: Payer: Medicare Other

## 2017-11-15 NOTE — Assessment & Plan Note (Signed)
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 MRI 02/14/2015: T5 large destructive lesion with pathologic compression fracture and extensive epidural tumor, involvement of T4, excision metastatic carcinoma ER 60%, PR 0%, HER-2 positive ratio 2.71 average copy #7.45 03/27/2015: T4-6 decompression surgery for spinal cord compression PET-CT 12/10/17Rt para-spinal mass mildy hypermetabolic SUV 5.9, lytic cortical lesion 4.8 cm lesion left prox femur suv 8.8 favor osseus met disease Echo 04/12/2017 shows well preserved ejection fraction of 55-60%   Treatment summary: 1. Resumed anastrozole 05/21/2015, but it has caused significant hair loss so we switched her to exemestane 25 mg once daily 07/23/2015 2. Herceptin every 3 week started 04/10/2015 3. Bone metastases: Zometa every 3 months-next duelate May/early June 4. Radiation therapy to the spine (Dr. [name]) 04/13/2015 to 05/19/2015 5. Radiation therapy to left femur 04/28/2016 to 05/12/2016  6. SRS to the brain lesion 11/24/2016 7.Kadcyla started 05/05/2017 discontinued 07/27/2017 -------------------------------------------------------------------------------------------------------------------------- Current treatment:Palbociclib +Faslodex +trastuzumab+ Pertuzumabstarted 08/10/2017  Toxicities of palbociclib: 1.  Neutropenia: Decrease dosage to 100 mg palbociclib, today's ANC is 1.1 2. fatigue: Much improved since the dose was reduced  Thoracic MRI report: Subcutaneous nodules appear to be getting smaller Brain MRI report 09/21/2017: Enlarging right parietal calvarial suspected metastases.  Stable right frontal calvarial metastases no intracranial mass.  Faslodex and Trastuzumab, Pertuzumabevery 4 weeks.

## 2017-11-16 ENCOUNTER — Inpatient Hospital Stay (HOSPITAL_BASED_OUTPATIENT_CLINIC_OR_DEPARTMENT_OTHER): Payer: Medicare Other | Admitting: Hematology and Oncology

## 2017-11-16 ENCOUNTER — Inpatient Hospital Stay: Payer: Medicare Other

## 2017-11-16 ENCOUNTER — Telehealth: Payer: Self-pay | Admitting: Hematology and Oncology

## 2017-11-16 ENCOUNTER — Inpatient Hospital Stay: Payer: Medicare Other | Attending: Hematology and Oncology

## 2017-11-16 DIAGNOSIS — Z9011 Acquired absence of right breast and nipple: Secondary | ICD-10-CM

## 2017-11-16 DIAGNOSIS — Z923 Personal history of irradiation: Secondary | ICD-10-CM

## 2017-11-16 DIAGNOSIS — C7951 Secondary malignant neoplasm of bone: Secondary | ICD-10-CM

## 2017-11-16 DIAGNOSIS — C50411 Malignant neoplasm of upper-outer quadrant of right female breast: Secondary | ICD-10-CM

## 2017-11-16 DIAGNOSIS — Z9221 Personal history of antineoplastic chemotherapy: Secondary | ICD-10-CM | POA: Diagnosis not present

## 2017-11-16 DIAGNOSIS — Z5112 Encounter for antineoplastic immunotherapy: Secondary | ICD-10-CM | POA: Diagnosis not present

## 2017-11-16 DIAGNOSIS — Z170 Estrogen receptor positive status [ER+]: Secondary | ICD-10-CM

## 2017-11-16 DIAGNOSIS — G939 Disorder of brain, unspecified: Secondary | ICD-10-CM | POA: Diagnosis not present

## 2017-11-16 DIAGNOSIS — Z79811 Long term (current) use of aromatase inhibitors: Secondary | ICD-10-CM | POA: Insufficient documentation

## 2017-11-16 LAB — CBC WITH DIFFERENTIAL (CANCER CENTER ONLY)
Basophils Absolute: 0.1 10*3/uL (ref 0.0–0.1)
Basophils Relative: 4 %
Eosinophils Absolute: 0 10*3/uL (ref 0.0–0.5)
Eosinophils Relative: 2 %
HCT: 34.2 % — ABNORMAL LOW (ref 34.8–46.6)
Hemoglobin: 11.7 g/dL (ref 11.6–15.9)
Lymphocytes Relative: 18 %
Lymphs Abs: 0.4 10*3/uL — ABNORMAL LOW (ref 0.9–3.3)
MCH: 32.8 pg (ref 25.1–34.0)
MCHC: 34.2 g/dL (ref 31.5–36.0)
MCV: 95.9 fL (ref 79.5–101.0)
Monocytes Absolute: 0.3 10*3/uL (ref 0.1–0.9)
Monocytes Relative: 14 %
Neutro Abs: 1.4 10*3/uL — ABNORMAL LOW (ref 1.5–6.5)
Neutrophils Relative %: 62 %
Platelet Count: 188 10*3/uL (ref 145–400)
RBC: 3.56 MIL/uL — ABNORMAL LOW (ref 3.70–5.45)
RDW: 22.4 % — ABNORMAL HIGH (ref 11.2–14.5)
WBC Count: 2.3 10*3/uL — ABNORMAL LOW (ref 3.9–10.3)

## 2017-11-16 LAB — CMP (CANCER CENTER ONLY)
ALT: 22 U/L (ref 0–44)
AST: 33 U/L (ref 15–41)
Albumin: 4 g/dL (ref 3.5–5.0)
Alkaline Phosphatase: 73 U/L (ref 38–126)
Anion gap: 8 (ref 5–15)
BUN: 13 mg/dL (ref 8–23)
CO2: 29 mmol/L (ref 22–32)
Calcium: 10.2 mg/dL (ref 8.9–10.3)
Chloride: 100 mmol/L (ref 98–111)
Creatinine: 0.87 mg/dL (ref 0.44–1.00)
GFR, Est AFR Am: >60 mL/min (ref >60)
GFR, Estimated: >60 mL/min (ref >60)
Glucose, Bld: 136 mg/dL — ABNORMAL HIGH (ref 70–99)
Potassium: 3.8 mmol/L (ref 3.5–5.1)
Sodium: 137 mmol/L (ref 135–145)
Total Bilirubin: 0.6 mg/dL (ref 0.3–1.2)
Total Protein: 7.4 g/dL (ref 6.5–8.1)

## 2017-11-16 MED ORDER — SODIUM CHLORIDE 0.9 % IV SOLN
Freq: Once | INTRAVENOUS | Status: AC
  Administered 2017-11-16: 11:00:00 via INTRAVENOUS

## 2017-11-16 MED ORDER — SODIUM CHLORIDE 0.9 % IJ SOLN
10.0000 mL | INTRAMUSCULAR | Status: DC | PRN
  Administered 2017-11-16: 10 mL via INTRAVENOUS
  Filled 2017-11-16: qty 10

## 2017-11-16 MED ORDER — ACETAMINOPHEN 325 MG PO TABS
650.0000 mg | ORAL_TABLET | Freq: Once | ORAL | Status: AC
  Administered 2017-11-16: 650 mg via ORAL

## 2017-11-16 MED ORDER — FULVESTRANT 250 MG/5ML IM SOLN
500.0000 mg | Freq: Once | INTRAMUSCULAR | Status: AC
  Administered 2017-11-16: 500 mg via INTRAMUSCULAR

## 2017-11-16 MED ORDER — ACETAMINOPHEN 325 MG PO TABS
ORAL_TABLET | ORAL | Status: AC
  Filled 2017-11-16: qty 2

## 2017-11-16 MED ORDER — FULVESTRANT 250 MG/5ML IM SOLN
INTRAMUSCULAR | Status: AC
  Filled 2017-11-16: qty 10

## 2017-11-16 MED ORDER — SODIUM CHLORIDE 0.9 % IV SOLN
420.0000 mg | Freq: Once | INTRAVENOUS | Status: AC
  Administered 2017-11-16: 420 mg via INTRAVENOUS
  Filled 2017-11-16: qty 14

## 2017-11-16 MED ORDER — SODIUM CHLORIDE 0.9% FLUSH
10.0000 mL | INTRAVENOUS | Status: DC | PRN
  Administered 2017-11-16: 10 mL
  Filled 2017-11-16: qty 10

## 2017-11-16 MED ORDER — HEPARIN SOD (PORK) LOCK FLUSH 100 UNIT/ML IV SOLN
500.0000 [IU] | Freq: Once | INTRAVENOUS | Status: AC | PRN
  Administered 2017-11-16: 500 [IU]
  Filled 2017-11-16: qty 5

## 2017-11-16 MED ORDER — TRASTUZUMAB CHEMO 150 MG IV SOLR
6.0000 mg/kg | Freq: Once | INTRAVENOUS | Status: AC
  Administered 2017-11-16: 357 mg via INTRAVENOUS
  Filled 2017-11-16: qty 17

## 2017-11-16 NOTE — Patient Instructions (Signed)
[facility] Discharge Instructions for Patients Receiving Chemotherapy  Today you received the following chemotherapy agents: Trastuzumab (Herceptin) and Pertuzumab (Perjeta)  To help prevent nausea and vomiting after your treatment, we encourage you to take your nausea medication as prescribed.    If you develop nausea and vomiting that is not controlled by your nausea medication, call the clinic.   BELOW ARE SYMPTOMS THAT SHOULD BE REPORTED IMMEDIATELY:  *FEVER GREATER THAN 100.5 F  *CHILLS WITH OR WITHOUT FEVER  NAUSEA AND VOMITING THAT IS NOT CONTROLLED WITH YOUR NAUSEA MEDICATION  *UNUSUAL SHORTNESS OF BREATH  *UNUSUAL BRUISING OR BLEEDING  TENDERNESS IN MOUTH AND THROAT WITH OR WITHOUT PRESENCE OF ULCERS  *URINARY PROBLEMS  *BOWEL PROBLEMS  UNUSUAL RASH Items with * indicate a potential emergency and should be followed up as soon as possible.  Feel free to call the clinic should you have any questions or concerns. The clinic phone number is [phone].  Please show the Eugen[facility] Saenz at check-in to the Emergency Department and triage nurse.  Fulvestrant injection What is this medicine? FULVESTRANT (ful VES trant) blocks the effects of estrogen. It is used to treat breast cancer. This medicine may be used for other purposes; ask your health care provider or pharmacist if you have questions. COMMON BRAND NAME(S): FASLODEX What should I tell my health care provider before I take this medicine? They need to know if you have any of these conditions: -bleeding problems -liver disease -low levels of platelets in the blood -an unusual or allergic reaction to fulvestrant, other medicines, foods, dyes, or preservatives -pregnant or trying to get pregnant -breast-feeding How should I use this medicine? This medicine is for injection into a muscle. It is usually given by a health care professional in a hospital or clinic setting. Talk to your  pediatrician regarding the use of this medicine in children. Special care may be needed. Overdosage: If you think you have taken too much of this medicine contact a poison control center or emergency room at once. NOTE: This medicine is only for you. Do not share this medicine with others. What if I miss a dose? It is important not to miss your dose. Call your doctor or health care professional if you are unable to keep an appointment. What may interact with this medicine? -medicines that treat or prevent blood clots like warfarin, enoxaparin, and dalteparin This list may not describe all possible interactions. Give your health care provider a list of all the medicines, herbs, non-prescription drugs, or dietary supplements you use. Also tell them if you smoke, drink alcohol, or use illegal drugs. Some items may interact with your medicine. What should I watch for while using this medicine? Your condition will be monitored carefully while you are receiving this medicine. You will need important blood work done while you are taking this medicine. Do not become pregnant while taking this medicine or for at least 1 year after stopping it. Women of child-bearing potential will need to have a negative pregnancy test before starting this medicine. Women should inform their doctor if they wish to become pregnant or think they might be pregnant. There is a potential for serious side effects to an unborn child. Men should inform their doctors if they wish to father a child. This medicine may lower sperm counts. Talk to your health care professional or pharmacist for more information. Do not breast-feed an infant while taking this medicine or for 1 year after the last dose.  What side effects may I notice from receiving this medicine? Side effects that you should report to your doctor or health care professional as soon as possible: -allergic reactions like skin rash, itching or hives, swelling of the face, lips,  or tongue -feeling faint or lightheaded, falls -pain, tingling, numbness, or weakness in the legs -signs and symptoms of infection like fever or chills; cough; flu-like symptoms; sore throat -vaginal bleeding Side effects that usually do not require medical attention (report to your doctor or health care professional if they continue or are bothersome): -aches, pains -constipation -diarrhea -headache -hot flashes -nausea, vomiting -pain at site where injected -stomach pain This list may not describe all possible side effects. Call your doctor for medical advice about side effects. You may report side effects to FDA at 1-800-FDA-1088. Where should I keep my medicine? This drug is given in a hospital or clinic and will not be stored at home. NOTE: This sheet is a summary. It may not cover all possible information. If you have questions about this medicine, talk to your doctor, pharmacist, or health care provider.  2018 [employer]/Gold Standard (2014-11-21 11:03:55)

## 2017-11-16 NOTE — Telephone Encounter (Signed)
Per 7/11 Md said  Patient did not need Lab he would just see her on 8/14 with treatment. If are labs are needed he would request then.

## 2017-11-16 NOTE — Progress Notes (Signed)
Patient Care Team: [name], MD as PCP - General (Family Medicine)  DIAGNOSIS:  Encounter Diagnosis  Name Primary?  . Primary cancer of upper outer quadrant of right female breast ([place])     SUMMARY OF ONCOLOGIC HISTORY:   Primary cancer of upper outer quadrant of right female breast ([place])   03/08/2012 Initial Diagnosis    Right breast invasive ductal carcinoma ER positive PR negative HER-2 positive Ki-67 44%; another breast mass biopsied in the anterior part of the breast which was also positive for malignancy that was HER-2 negative      05/10/2012 Surgery    Right mastectomy and axillary lymph node dissection: Multifocal disease 5 cm, 1.7 cm, 1.6 cm, ER positive PR negative HER-2 positive Ki-67 44%, 3/7 lymph nodes positive      06/14/2012 - 06/12/2013 Chemotherapy    Adjuvant chemotherapy with Braymer 6 followed by Herceptin maintenance      10/25/2012 - 12/11/2012 Radiation Therapy    Adjuvant radiation therapy      02/07/2013 -  Anti-estrogen oral therapy    Letrozole 2.5 mg daily      02/14/2015 Imaging    MRI spine: Large destructive T5 lesion with severe pathologic compression fracture and extensive epidural tumor, severe spinal stenosis with moderate cord compression, tumor involvement of T4      03/18/2015 PET scan    Residual enhancing soft tissue adjacent to the spinal cord. No evidence of metastatic disease. Nonspecific uptake the left nipple      03/27/2015 - 03/30/2015 Hospital Admission    T4-6 decompression for spinal cord compression (lower extremity paralysis)      04/10/2015 -  Chemotherapy    Palliative treatment with Herceptin every 3 weeks with letrozole 2.5 mg daily      04/13/2015 - 05/19/2015 Radiation Therapy    Palliative radiation treatment to the spine      09/01/2015 Imaging    CT chest abdomen pelvis: Pathologic fracture with posterior fusion at T5 no other evidence of metastatic disease in the chest abdomen pelvis      12/23/2015 Imaging      CT CAP: new nonspecific 0.6 cm lymph node was stated mediastinum needs follow-up CT, innumerable tiny groundglass pulmonary nodules throughout both lungs unchanged, patchy consolidation from radiation, T5 fracture, no mets      04/15/2016 PET scan    Rt para-spinal mass mildy hypermetabolic SUV 5.9, lytic cortical lesion 4.8 cm lesion left prox femur suv 8.8 favor osseus met disease      04/28/2016 - 05/12/2016 Radiation Therapy    Palliative XRT Left femur      05/24/2016 Procedure    Soft tissue mass biopsy back: Metastatic breast cancer ER 80%, PR 0%, Ki-67 50%, HER-2 positive ratio 2.25      06/09/2016 - 03/29/2017 Anti-estrogen oral therapy    Faslodex with Herceptin and Perjeta every 4 weeks      06/13/2016 - 06/15/2016 Hospital Admission    uncomplicated revision of previous thoracic instrumentation.      10/10/2016 PET scan    Interval development of new hypermetabolic 2.5 x 1.1 cm lesion posterior right eighth rib consistent with metastatic disease, stable right paraspinal lesion at T8, clustered soft tissue nodules in the posterior midline back near cervicothoracic junction smaller than before       11/03/2016 - 11/07/2016 Radiation Therapy    Palliative radiation to right eighth rib      11/04/2016 Imaging    Solitary contrast-enhancing lesion in the right  cerebellum 6 x 2 mm concerning for metastatic lesion      11/24/2016 - 11/24/2016 Radiation Therapy    SRS to the brain lesion      04/18/2017 PET scan    New ill-defined rounded airspace opacity in posterior right lower lobe. Differential diagnosis includes malignancy and infectious/inflammatory process.Increased hypermetabolic lytic lesion in T8 vertebral body, consistent with bone metastasis.  Increased hypermetabolic posterior upper chest wall subcutaneous nodules, highly suspicious metastatic disease. Further improvement in right posterior eighth rib metastasis.      05/04/2017 - 07/27/2017 Chemotherapy    Kadcyla  every 3 weeks palliative chemotherapy stopped for progression      07/26/2017 Imaging    Interval increase in size of the multiple enhancing masses posteriorly overlying the spine, interval development of left axillary and mediastinal adenopathy, increase in the size of lytic lesions in T8-T9 and left ninth rib, increased groundglass opacities right lower lobe lung      07/27/2017 -  Chemotherapy    Palbociclib + Faslodex + trastuzumab + Pertuzumab         CHIEF COMPLIANT: Follow-up on palbociclib along with Faslodex combined with trastuzumab and Pertuzumab  INTERVAL HISTORY: Heather [name] is a 72 year old with above-mentioned history metastatic breast cancer who is currently on palbociclib along with Faslodex and trastuzumab and Pertuzumab.  She appears to be having fatigue is her biggest complaint.  The fatigue however has improved significantly.  She is now on a 3-week on 2-week off Ibrance.  Does not have nausea vomiting.  Generalized weakness.  Palpable tumor in the back.  REVIEW OF SYSTEMS:   Constitutional: Denies fevers, chills or abnormal weight loss Eyes: Denies blurriness of vision Ears, nose, mouth, throat, and face: Denies mucositis or sore throat Respiratory: Denies cough, dyspnea or wheezes Cardiovascular: Denies palpitation, chest discomfort Gastrointestinal:  Denies nausea, heartburn or change in bowel habits Skin: Denies abnormal skin rashes Lymphatics: Denies new lymphadenopathy or easy bruising Neurological: Generalized weaknesses Behavioral/Psych: Mood is stable, no new changes  Extremities: No lower extremity edema  All other systems were reviewed with the patient and are negative.  I have reviewed the past medical history, past surgical history, social history and family history with the patient and they are unchanged from previous note.  ALLERGIES:  is allergic to other; acyclovir and related; zanaflex [tizanidine hcl]; codeine; keflex [cephalexin]; naprosyn  [naproxen]; oxycodone; tramadol; and tussin [guaifenesin].  MEDICATIONS:  Current Outpatient Medications  Medication Sig Dispense Refill  . ALPRAZolam (XANAX) 0.25 MG tablet Take 1 tablet by mouth as needed.    Marland Kitchen b complex vitamins tablet Take 1 tablet by mouth daily.    . Calcium Carbonate Antacid (TUMS PO) Take 2 tablets by mouth 2 (two) times daily as needed (acid reflux).    . Cholecalciferol (VITAMIN D) 2000 UNITS tablet Take 2,000 Units by mouth daily.    . Coenzyme Q10 (COQ10) 100 MG CAPS Take 100 mg by mouth as needed. Days that she takes pravastatin MWF.    Marland Kitchen fluticasone (FLONASE) 50 MCG/ACT nasal spray Place 1 spray into both nostrils daily as needed.    . gabapentin (NEURONTIN) 600 MG tablet Take 1 tablet (600 mg total) by mouth as directed. Take 1 tablet in the AM and afternoon and 1 1/2 tabs at bedtime (Patient taking differently: Take 600 mg by mouth 3 (three) times daily. Take 1 tablet in the AM and afternoon and 1 1/2 tabs at bedtime) 315 tablet 2  . HYDROcodone-acetaminophen (NORCO/VICODIN) 5-325 MG tablet     .  lidocaine-prilocaine (EMLA) cream Apply to affected area once (Patient taking differently: Apply 1 application topically every 30 (thirty) days. Apply to port prior to infusions) 30 g 3  . OVER THE COUNTER MEDICATION Place 1 drop into both eyes 2 (two) times daily as needed (dry eyes). Over the counter lubricating eye drops    . palbociclib (IBRANCE) 100 MG capsule Take 1 capsule (100 mg total) by mouth daily with breakfast. Take for 21 days on, 14 days off, repeat every 35 days. Take whole with food. 21 capsule 6  . Probiotic Product (PROBIOTIC DAILY PO) Take 1 capsule by mouth daily.     . ranitidine (ZANTAC) 300 MG tablet Take 300 mg by mouth at bedtime. Reported on 06/25/2015    . spironolactone (ALDACTONE) 25 MG tablet Take 25 mg by mouth daily.     No current facility-administered medications for this visit.    Facility-Administered Medications Ordered in Other  Visits  Medication Dose Route Frequency Provider Last Rate Last Dose  . 0.9 %  sodium chloride infusion   Intravenous Once [name], MD      . acetaminophen (TYLENOL) tablet 650 mg  650 mg Oral Once [name], MD      . fulvestrant (FASLODEX) injection 500 mg  500 mg Intramuscular Once [name], MD      . heparin lock flush 100 unit/mL  500 Units Intracatheter Once PRN [name], MD      . pertuzumab (PERJETA) 420 mg in sodium chloride 0.9 % 250 mL chemo infusion  420 mg Intravenous Once [name], MD      . sodium chloride 0.9 % injection 10 mL  10 mL Intravenous PRN [name], MD   10 mL at 04/26/17 0828  . sodium chloride flush (NS) 0.9 % injection 10 mL  10 mL Intracatheter PRN [name], MD      . trastuzumab (HERCEPTIN) 357 mg in sodium chloride 0.9 % 250 mL chemo infusion  6 mg/kg (Treatment Plan Recorded) Intravenous Once [name], MD        PHYSICAL EXAMINATION: ECOG PERFORMANCE STATUS: 2 - Symptomatic, <50% confined to bed  Vitals:   11/16/17 0939  BP: (!) 146/66  Pulse: 64  Resp: 17  Temp: 97.8 F (36.6 C)  SpO2: 100%   Filed Weights   11/16/17 0939  Weight: 130 lb 8 oz (59.2 kg)    GENERAL:alert, no distress and comfortable SKIN: skin color, texture, turgor are normal, no rashes or significant lesions EYES: normal, Conjunctiva are pink and non-injected, sclera clear OROPHARYNX:no exudate, no erythema and lips, buccal mucosa, and tongue normal  NECK: supple, thyroid normal size, non-tender, without nodularity LYMPH:  no palpable lymphadenopathy in the cervical, axillary or inguinal LUNGS: clear to auscultation and percussion with normal breathing effort HEART: regular rate & rhythm and no murmurs and no lower extremity edema ABDOMEN:abdomen soft, non-tender and normal bowel sounds MUSCULOSKELETAL:no cyanosis of digits and no clubbing  NEURO: alert & oriented x 3 with fluent speech, lower extremity weakness EXTREMITIES: No lower  extremity edema   LABORATORY DATA:  I have reviewed the data as listed CMP Latest Ref Rng & Units 11/16/2017 10/12/2017 09/21/2017  Glucose 70 - 99 mg/dL 136(H) 86 113  BUN 8 - 23 mg/dL '13 13 17  '$ Creatinine 0.44 - 1.00 mg/dL 0.87 0.85 1.08  Sodium 135 - 145 mmol/L 137 137 138  Potassium 3.5 - 5.1 mmol/L 3.8 4.0 3.9  Chloride 98 - 111 mmol/L 100 101 102  CO2 22 - 32 mmol/L '29 28 28  '$ Calcium 8.9 - 10.3 mg/dL 10.2 9.9 9.5  Total Protein 6.5 - 8.1 g/dL 7.4 7.5 7.3  Total Bilirubin 0.3 - 1.2 mg/dL 0.6 0.4 0.4  Alkaline Phos 38 - 126 U/L 73 81 73  AST 15 - 41 U/L 33 41(H) 34  ALT 0 - 44 U/L '22 24 22    '$ Lab Results  Component Value Date   WBC 2.3 (L) 11/16/2017   HGB 11.7 11/16/2017   HCT 34.2 (L) 11/16/2017   MCV 95.9 11/16/2017   PLT 188 11/16/2017   NEUTROABS 1.4 (L) 11/16/2017    ASSESSMENT & PLAN:  Primary cancer of upper outer quadrant of right female breast ([place]) Right breast invasive ductal carcinoma ER positive PR negative HER-2 positive Ki-67 44% multifocal disease 3/7 lymph nodes positive T2 N1 M0 stage IIB status post adjuvant chemotherapy with TCH followed by Herceptin maintenance, adjuvant radiation therapy and Letrozole. MRI 02/14/2015: T5 large destructive lesion with pathologic compression fracture and extensive epidural tumor, involvement of T4, excision metastatic carcinoma ER 60%, PR 0%, HER-2 positive ratio 2.71 average copy #7.45 03/27/2015: T4-6 decompression surgery for spinal cord compression PET-CT 12/10/17Rt para-spinal mass mildy hypermetabolic SUV 5.9, lytic cortical lesion 4.8 cm lesion left prox femur suv 8.8 favor osseus met disease Echo 04/12/2017 shows well preserved ejection fraction of 55-60%   Treatment summary: 1. Resumed anastrozole 05/21/2015, but it has caused significant hair loss so we switched her to exemestane 25 mg once daily 07/23/2015 2. Herceptin every 3 week started 04/10/2015 3. Bone metastases: Zometa every 3 months-next duelate  May/early June 4. Radiation therapy to the spine (Dr. [name]) 04/13/2015 to 05/19/2015 5. Radiation therapy to left femur 04/28/2016 to 05/12/2016  6. SRS to the brain lesion 11/24/2016 7.Kadcyla started 05/05/2017 discontinued 07/27/2017 -------------------------------------------------------------------------------------------------------------------------- Current treatment:Palbociclib +Faslodex +trastuzumab+ Pertuzumabstarted 08/10/2017  Toxicities of palbociclib: 1.  Neutropenia: Decrease dosage to 100 mg palbociclib, 3 weeks on 2 weeks off today's ANC is 1.4 2. fatigue: Much improved since the dose was reduced  Thoracic MRI report: Subcutaneous nodules appear to be getting smaller Brain MRI report 09/21/2017: Enlarging right parietal calvarial suspected metastases.  Stable right frontal calvarial metastases no intracranial mass.  Faslodex and Trastuzumab, Pertuzumabevery 4 weeks. Plan is to obtain CT chest abdomen pelvis in August and follow-up after that. She will also be getting a brain MRI with radiation oncology.   No orders of the defined types were placed in this encounter.  The patient has a good understanding of the overall plan. she agrees with it. she will call with any problems that may develop before the next visit here.   Harriette Ohara, MD 11/16/17

## 2017-11-23 MED FILL — IBRANCE 100 MG CAPSULE: 100 | 28 days supply | Qty: 21 | Fill #1

## 2017-12-13 ENCOUNTER — Other Ambulatory Visit: Payer: Self-pay

## 2017-12-13 ENCOUNTER — Encounter (HOSPITAL_COMMUNITY): Payer: Self-pay

## 2017-12-13 ENCOUNTER — Ambulatory Visit (HOSPITAL_COMMUNITY)
Admission: RE | Admit: 2017-12-13 | Discharge: 2017-12-13 | Disposition: A | Discharge status: Home / Self Care | Payer: Medicare Other | Source: Ambulatory Visit | Attending: Hematology and Oncology | Admitting: Hematology and Oncology

## 2017-12-13 DIAGNOSIS — C50911 Malignant neoplasm of unspecified site of right female breast: Secondary | ICD-10-CM | POA: Diagnosis not present

## 2017-12-13 DIAGNOSIS — C7951 Secondary malignant neoplasm of bone: Principal | ICD-10-CM

## 2017-12-13 DIAGNOSIS — C50919 Malignant neoplasm of unspecified site of unspecified female breast: Secondary | ICD-10-CM | POA: Diagnosis not present

## 2017-12-13 DIAGNOSIS — C7800 Secondary malignant neoplasm of unspecified lung: Secondary | ICD-10-CM | POA: Diagnosis not present

## 2017-12-13 DIAGNOSIS — C50411 Malignant neoplasm of upper-outer quadrant of right female breast: Secondary | ICD-10-CM

## 2017-12-13 DIAGNOSIS — J984 Other disorders of lung: Secondary | ICD-10-CM | POA: Diagnosis not present

## 2017-12-13 DIAGNOSIS — K449 Diaphragmatic hernia without obstruction or gangrene: Secondary | ICD-10-CM | POA: Diagnosis not present

## 2017-12-13 DIAGNOSIS — Z853 Personal history of malignant neoplasm of breast: Secondary | ICD-10-CM | POA: Diagnosis not present

## 2017-12-13 MED ORDER — IOPAMIDOL (ISOVUE-300) INJECTION 61%
100.0000 mL | Freq: Once | INTRAVENOUS | Status: AC | PRN
  Administered 2017-12-13: 100 mL via INTRAVENOUS

## 2017-12-13 MED ORDER — HEPARIN SOD (PORK) LOCK FLUSH 100 UNIT/ML IV SOLN
500.0000 [IU] | Freq: Once | INTRAVENOUS | Status: AC
  Administered 2017-12-13: 500 [IU] via INTRAVENOUS

## 2017-12-13 MED ORDER — HEPARIN SOD (PORK) LOCK FLUSH 100 UNIT/ML IV SOLN
INTRAVENOUS | Status: AC
  Filled 2017-12-13: qty 5

## 2017-12-20 ENCOUNTER — Inpatient Hospital Stay: Payer: Medicare Other | Attending: Hematology and Oncology

## 2017-12-20 ENCOUNTER — Telehealth: Payer: Self-pay | Admitting: Hematology and Oncology

## 2017-12-20 ENCOUNTER — Inpatient Hospital Stay (HOSPITAL_BASED_OUTPATIENT_CLINIC_OR_DEPARTMENT_OTHER): Payer: Medicare Other | Admitting: Hematology and Oncology

## 2017-12-20 ENCOUNTER — Other Ambulatory Visit: Payer: Medicare Other

## 2017-12-20 DIAGNOSIS — Z9221 Personal history of antineoplastic chemotherapy: Secondary | ICD-10-CM | POA: Diagnosis not present

## 2017-12-20 DIAGNOSIS — Z923 Personal history of irradiation: Secondary | ICD-10-CM | POA: Diagnosis not present

## 2017-12-20 DIAGNOSIS — Z79811 Long term (current) use of aromatase inhibitors: Secondary | ICD-10-CM | POA: Diagnosis not present

## 2017-12-20 DIAGNOSIS — Z5112 Encounter for antineoplastic immunotherapy: Secondary | ICD-10-CM | POA: Diagnosis not present

## 2017-12-20 DIAGNOSIS — Z9011 Acquired absence of right breast and nipple: Secondary | ICD-10-CM | POA: Diagnosis not present

## 2017-12-20 DIAGNOSIS — C7951 Secondary malignant neoplasm of bone: Secondary | ICD-10-CM | POA: Diagnosis not present

## 2017-12-20 DIAGNOSIS — D709 Neutropenia, unspecified: Secondary | ICD-10-CM | POA: Diagnosis not present

## 2017-12-20 DIAGNOSIS — Z170 Estrogen receptor positive status [ER+]: Secondary | ICD-10-CM | POA: Insufficient documentation

## 2017-12-20 DIAGNOSIS — C50411 Malignant neoplasm of upper-outer quadrant of right female breast: Secondary | ICD-10-CM | POA: Diagnosis not present

## 2017-12-20 MED ORDER — SODIUM CHLORIDE 0.9% FLUSH
10.0000 mL | INTRAVENOUS | Status: DC | PRN
  Administered 2017-12-20: 10 mL
  Filled 2017-12-20: qty 10

## 2017-12-20 MED ORDER — SODIUM CHLORIDE 0.9 % IV SOLN
420.0000 mg | Freq: Once | INTRAVENOUS | Status: AC
  Administered 2017-12-20: 420 mg via INTRAVENOUS
  Filled 2017-12-20: qty 14

## 2017-12-20 MED ORDER — FULVESTRANT 250 MG/5ML IM SOLN
INTRAMUSCULAR | Status: AC
  Filled 2017-12-20: qty 5

## 2017-12-20 MED ORDER — ACETAMINOPHEN 325 MG PO TABS
650.0000 mg | ORAL_TABLET | Freq: Once | ORAL | Status: AC
  Administered 2017-12-20: 650 mg via ORAL

## 2017-12-20 MED ORDER — FULVESTRANT 250 MG/5ML IM SOLN
500.0000 mg | Freq: Once | INTRAMUSCULAR | Status: AC
  Administered 2017-12-20: 500 mg via INTRAMUSCULAR

## 2017-12-20 MED ORDER — CYANOCOBALAMIN 1000 MCG/ML IJ SOLN
1000.0000 ug | Freq: Once | INTRAMUSCULAR | Status: AC
  Administered 2017-12-20: 1000 ug via INTRAMUSCULAR

## 2017-12-20 MED ORDER — CYANOCOBALAMIN 1000 MCG/ML IJ SOLN
INTRAMUSCULAR | Status: AC
  Filled 2017-12-20: qty 1

## 2017-12-20 MED ORDER — HEPARIN SOD (PORK) LOCK FLUSH 100 UNIT/ML IV SOLN
500.0000 [IU] | Freq: Once | INTRAVENOUS | Status: AC | PRN
  Administered 2017-12-20: 500 [IU]
  Filled 2017-12-20: qty 5

## 2017-12-20 MED ORDER — TRASTUZUMAB CHEMO 150 MG IV SOLR
6.0000 mg/kg | Freq: Once | INTRAVENOUS | Status: AC
  Administered 2017-12-20: 357 mg via INTRAVENOUS
  Filled 2017-12-20: qty 17

## 2017-12-20 MED ORDER — ACETAMINOPHEN 325 MG PO TABS
ORAL_TABLET | ORAL | Status: AC
  Filled 2017-12-20: qty 2

## 2017-12-20 MED ORDER — SODIUM CHLORIDE 0.9 % IV SOLN
Freq: Once | INTRAVENOUS | Status: AC
  Administered 2017-12-20: 09:00:00 via INTRAVENOUS
  Filled 2017-12-20: qty 250

## 2017-12-20 NOTE — Patient Instructions (Signed)
[facility] Discharge Instructions for Patients Receiving Chemotherapy  Today you received the following chemotherapy agents Trastuzumab (Herceptin), Pertuzumab (Perjeta), Xgeva, Vitamin B12.  To help prevent nausea and vomiting after your treatment, we encourage you to take your nausea medication as prescribed.  If you develop nausea and vomiting that is not controlled by your nausea medication, call the clinic.   BELOW ARE SYMPTOMS THAT SHOULD BE REPORTED IMMEDIATELY:  *FEVER GREATER THAN 100.5 F  *CHILLS WITH OR WITHOUT FEVER  NAUSEA AND VOMITING THAT IS NOT CONTROLLED WITH YOUR NAUSEA MEDICATION  *UNUSUAL SHORTNESS OF BREATH  *UNUSUAL BRUISING OR BLEEDING  TENDERNESS IN MOUTH AND THROAT WITH OR WITHOUT PRESENCE OF ULCERS  *URINARY PROBLEMS  *BOWEL PROBLEMS  UNUSUAL RASH Items with * indicate a potential emergency and should be followed up as soon as possible.  Feel free to call the clinic should you have any questions or concerns. The clinic phone number is [phone].  Please show the IXL at check-in to the Emergency Department and triage nurse.

## 2017-12-20 NOTE — Telephone Encounter (Signed)
Gave avs and calendar ° °

## 2017-12-20 NOTE — Progress Notes (Signed)
Patient Care Team: [name], MD as PCP - General (Family Medicine)  DIAGNOSIS:  Encounter Diagnosis  Name Primary?  . Primary cancer of upper outer quadrant of right female breast ([place])     SUMMARY OF ONCOLOGIC HISTORY:   Primary cancer of upper outer quadrant of right female breast ([place])   03/08/2012 Initial Diagnosis    Right breast invasive ductal carcinoma ER positive PR negative HER-2 positive Ki-67 44%; another breast mass biopsied in the anterior part of the breast which was also positive for malignancy that was HER-2 negative    05/10/2012 Surgery    Right mastectomy and axillary lymph node dissection: Multifocal disease 5 cm, 1.7 cm, 1.6 cm, ER positive PR negative HER-2 positive Ki-67 44%, 3/7 lymph nodes positive    06/14/2012 - 06/12/2013 Chemotherapy    Adjuvant chemotherapy with [place] 6 followed by Herceptin maintenance    10/25/2012 - 12/11/2012 Radiation Therapy    Adjuvant radiation therapy    02/07/2013 -  Anti-estrogen oral therapy    Letrozole 2.5 mg daily    02/14/2015 Imaging    MRI spine: Large destructive T5 lesion with severe pathologic compression fracture and extensive epidural tumor, severe spinal stenosis with moderate cord compression, tumor involvement of T4    03/18/2015 PET scan    Residual enhancing soft tissue adjacent to the spinal cord. No evidence of metastatic disease. Nonspecific uptake the left nipple    03/27/2015 - 03/30/2015 Hospital Admission    T4-6 decompression for spinal cord compression (lower extremity paralysis)    04/10/2015 -  Chemotherapy    Palliative treatment with Herceptin every 3 weeks with letrozole 2.5 mg daily    04/13/2015 - 05/19/2015 Radiation Therapy    Palliative radiation treatment to the spine    09/01/2015 Imaging    CT chest abdomen pelvis: Pathologic fracture with posterior fusion at T5 no other evidence of metastatic disease in the chest abdomen pelvis    12/23/2015 Imaging    CT CAP: new nonspecific 0.6  cm lymph node was stated mediastinum needs follow-up CT, innumerable tiny groundglass pulmonary nodules throughout both lungs unchanged, patchy consolidation from radiation, T5 fracture, no mets    04/15/2016 PET scan    Rt para-spinal mass mildy hypermetabolic SUV 5.9, lytic cortical lesion 4.8 cm lesion left prox femur suv 8.8 favor osseus met disease    04/28/2016 - 05/12/2016 Radiation Therapy    Palliative XRT Left femur    05/24/2016 Procedure    Soft tissue mass biopsy back: Metastatic breast cancer ER 80%, PR 0%, Ki-67 50%, HER-2 positive ratio 2.25    06/09/2016 - 03/29/2017 Anti-estrogen oral therapy    Faslodex with Herceptin and Perjeta every 4 weeks    06/13/2016 - 06/15/2016 Hospital Admission    uncomplicated revision of previous thoracic instrumentation.    10/10/2016 PET scan    Interval development of new hypermetabolic 2.5 x 1.1 cm lesion posterior right eighth rib consistent with metastatic disease, stable right paraspinal lesion at T8, clustered soft tissue nodules in the posterior midline back near cervicothoracic junction smaller than before     11/03/2016 - 11/07/2016 Radiation Therapy    Palliative radiation to right eighth rib    11/04/2016 Imaging    Solitary contrast-enhancing lesion in the right cerebellum 6 x 2 mm concerning for metastatic lesion    11/24/2016 - 11/24/2016 Radiation Therapy    SRS to the brain lesion    04/18/2017 PET scan    New ill-defined rounded airspace opacity  in posterior right lower lobe. Differential diagnosis includes malignancy and infectious/inflammatory process.Increased hypermetabolic lytic lesion in T8 vertebral body, consistent with bone metastasis.  Increased hypermetabolic posterior upper chest wall subcutaneous nodules, highly suspicious metastatic disease. Further improvement in right posterior eighth rib metastasis.    05/04/2017 - 07/27/2017 Chemotherapy    Kadcyla every 3 weeks palliative chemotherapy stopped for progression     07/26/2017 Imaging    Interval increase in size of the multiple enhancing masses posteriorly overlying the spine, interval development of left axillary and mediastinal adenopathy, increase in the size of lytic lesions in T8-T9 and left ninth rib, increased groundglass opacities right lower lobe lung    07/27/2017 -  Chemotherapy    Palbociclib + Faslodex + trastuzumab + Pertuzumab       CHIEF COMPLIANT: Follow-up to discuss the scans and continue with treatment  INTERVAL HISTORY: Heather [name] is a 72 year old with above-mentioned history metastatic breast cancer currently on palbociclib with Faslodex trastuzumab and pertuzumab.  She appears to be tolerating the treatment reasonably well.  Her biggest complaint is fatigue.  By afternoon she feels tired and sleepy.  She had scans which showed excellent response to chemotherapy.  REVIEW OF SYSTEMS:   Constitutional: Complains of fatigue Eyes: Denies blurriness of vision Ears, nose, mouth, throat, and face: Denies mucositis or sore throat Respiratory: Denies cough, dyspnea or wheezes Cardiovascular: Denies palpitation, chest discomfort Gastrointestinal:  Denies nausea, heartburn or change in bowel habits Skin: Denies abnormal skin rashes Lymphatics: Denies new lymphadenopathy or easy bruising Neurological:Denies numbness, tingling or new weaknesses Behavioral/Psych: Mood is stable, no new changes  Extremities: No lower extremity edema   All other systems were reviewed with the patient and are negative.  I have reviewed the past medical history, past surgical history, social history and family history with the patient and they are unchanged from previous note.  ALLERGIES:  is allergic to other; acyclovir and related; zanaflex [tizanidine hcl]; codeine; keflex [cephalexin]; naprosyn [naproxen]; oxycodone; tramadol; and tussin [guaifenesin].  MEDICATIONS:  Current Outpatient Medications  Medication Sig Dispense Refill  . ALPRAZolam  (XANAX) 0.25 MG tablet Take 1 tablet by mouth as needed.    Marland Kitchen b complex vitamins tablet Take 1 tablet by mouth daily.    . Calcium Carbonate Antacid (TUMS PO) Take 2 tablets by mouth 2 (two) times daily as needed (acid reflux).    . Cholecalciferol (VITAMIN D) 2000 UNITS tablet Take 2,000 Units by mouth daily.    . Coenzyme Q10 (COQ10) 100 MG CAPS Take 100 mg by mouth as needed. Days that she takes pravastatin MWF.    Marland Kitchen fluticasone (FLONASE) 50 MCG/ACT nasal spray Place 1 spray into both nostrils daily as needed.    . gabapentin (NEURONTIN) 600 MG tablet Take 1 tablet (600 mg total) by mouth as directed. Take 1 tablet in the AM and afternoon and 1 1/2 tabs at bedtime (Patient taking differently: Take 600 mg by mouth 3 (three) times daily. Take 1 tablet in the AM and afternoon and 1 1/2 tabs at bedtime) 315 tablet 2  . HYDROcodone-acetaminophen (NORCO/VICODIN) 5-325 MG tablet     . lidocaine-prilocaine (EMLA) cream Apply to affected area once (Patient taking differently: Apply 1 application topically every 30 (thirty) days. Apply to port prior to infusions) 30 g 3  . OVER THE COUNTER MEDICATION Place 1 drop into both eyes 2 (two) times daily as needed (dry eyes). Over the counter lubricating eye drops    . palbociclib (IBRANCE) 100 MG  capsule Take 1 capsule (100 mg total) by mouth daily with breakfast. Take for 21 days on, 14 days off, repeat every 35 days. Take whole with food. 21 capsule 6  . Probiotic Product (PROBIOTIC DAILY PO) Take 1 capsule by mouth daily.     . ranitidine (ZANTAC) 300 MG tablet Take 300 mg by mouth at bedtime. Reported on 06/25/2015    . spironolactone (ALDACTONE) 25 MG tablet Take 25 mg by mouth daily.     No current facility-administered medications for this visit.    Facility-Administered Medications Ordered in Other Visits  Medication Dose Route Frequency Provider Last Rate Last Dose  . sodium chloride 0.9 % injection 10 mL  10 mL Intravenous PRN [name], MD   10  mL at 04/26/17 0828    PHYSICAL EXAMINATION: ECOG PERFORMANCE STATUS: 2 - Symptomatic, <50% confined to bed  Vitals:   12/20/17 0802  BP: (!) 144/73  Pulse: 71  Resp: 17  Temp: 97.7 F (36.5 C)  SpO2: 100%   Filed Weights   12/20/17 0802  Weight: 130 lb 11.2 oz (59.3 kg)    GENERAL:alert, no distress and comfortable SKIN: skin color, texture, turgor are normal, no rashes or significant lesions EYES: normal, Conjunctiva are pink and non-injected, sclera clear OROPHARYNX:no exudate, no erythema and lips, buccal mucosa, and tongue normal  NECK: supple, thyroid normal size, non-tender, without nodularity LYMPH:  no palpable lymphadenopathy in the cervical, axillary or inguinal LUNGS: clear to auscultation and percussion with normal breathing effort HEART: regular rate & rhythm and no murmurs and no lower extremity edema ABDOMEN:abdomen soft, non-tender and normal bowel sounds MUSCULOSKELETAL:no cyanosis of digits and no clubbing  NEURO: alert & oriented x 3 with fluent speech, no focal motor/sensory deficits EXTREMITIES: No lower extremity edema   LABORATORY DATA:  I have reviewed the data as listed CMP Latest Ref Rng & Units 11/16/2017 10/12/2017 09/21/2017  Glucose 70 - 99 mg/dL 136(H) 86 113  BUN 8 - 23 mg/dL '13 13 17  '$ Creatinine 0.44 - 1.00 mg/dL 0.87 0.85 1.08  Sodium 135 - 145 mmol/L 137 137 138  Potassium 3.5 - 5.1 mmol/L 3.8 4.0 3.9  Chloride 98 - 111 mmol/L 100 101 102  CO2 22 - 32 mmol/L '29 28 28  '$ Calcium 8.9 - 10.3 mg/dL 10.2 9.9 9.5  Total Protein 6.5 - 8.1 g/dL 7.4 7.5 7.3  Total Bilirubin 0.3 - 1.2 mg/dL 0.6 0.4 0.4  Alkaline Phos 38 - 126 U/L 73 81 73  AST 15 - 41 U/L 33 41(H) 34  ALT 0 - 44 U/L '22 24 22    '$ Lab Results  Component Value Date   WBC 2.3 (L) 11/16/2017   HGB 11.7 11/16/2017   HCT 34.2 (L) 11/16/2017   MCV 95.9 11/16/2017   PLT 188 11/16/2017   NEUTROABS 1.4 (L) 11/16/2017    ASSESSMENT & PLAN:  Primary cancer of upper outer quadrant of  right female breast ([facility]) Right breast invasive ductal carcinoma ER positive PR negative HER-2 positive Ki-67 44% multifocal disease 3/7 lymph nodes positive T2 N1 M0 stage IIB status post adjuvant chemotherapy with TCH followed by Herceptin maintenance, adjuvant radiation therapy and Letrozole. MRI 02/14/2015: T5 large destructive lesion with pathologic compression fracture and extensive epidural tumor, involvement of T4, excision metastatic carcinoma ER 60%, PR 0%, HER-2 positive ratio 2.71 average copy #7.45 03/27/2015: T4-6 decompression surgery for spinal cord compression PET-CT 12/10/17Rt para-spinal mass mildy hypermetabolic SUV 5.9, lytic cortical lesion 4.8 cm lesion  left prox femur suv 8.8 favor osseus met disease Echo 04/12/2017 shows well preserved ejection fraction of 55-60%   Treatment summary: 1. Resumed anastrozole 05/21/2015, but it has caused significant hair loss so we switched her to exemestane 25 mg once daily 07/23/2015 2. Herceptin every 3 week started 04/10/2015 3. Bone metastases: Zometa every 3 months-next duelate May/early June 4. Radiation therapy to the spine (Dr. [name]) 04/13/2015 to 05/19/2015 5. Radiation therapy to left femur 04/28/2016 to 05/12/2016  6. SRS to the brain lesion 11/24/2016 7.Kadcyla started 05/05/2017 discontinued 07/27/2017 -------------------------------------------------------------------------------------------------------------------------- Current treatment:Palbociclib +Faslodex +trastuzumab+ Pertuzumabstarted 08/10/2017  Toxicities of palbociclib: 1.Neutropenia: Decrease dosage to 100 mg palbociclib, 3 weeks on 2 weeks off today's ANC is1.4 2.fatigue: Much improved since the dose was reduced  CT CAP 12/13/2017: Mediastinal adenopathy has regressed, left axillary adenopathy resolved, subcutaneous tumor regressed, no new or progressive bone metastases Patient wanted to know if she can do a colonoscopy.  There is no  contraindication for that.  Continue with Faslodex and Trastuzumab, Pertuzumabevery 4 weeks.    No orders of the defined types were placed in this encounter.  The patient has a good understanding of the overall plan. she agrees with it. she will call with any problems that may develop before the next visit here.   Harriette Ohara, MD 12/20/17

## 2017-12-20 NOTE — Assessment & Plan Note (Signed)
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 MRI 02/14/2015: T5 large destructive lesion with pathologic compression fracture and extensive epidural tumor, involvement of T4, excision metastatic carcinoma ER 60%, PR 0%, HER-2 positive ratio 2.71 average copy #7.45 03/27/2015: T4-6 decompression surgery for spinal cord compression PET-CT 12/10/17Rt para-spinal mass mildy hypermetabolic SUV 5.9, lytic cortical lesion 4.8 cm lesion left prox femur suv 8.8 favor osseus met disease Echo 04/12/2017 shows well preserved ejection fraction of 55-60%   Treatment summary: 1. Resumed anastrozole 05/21/2015, but it has caused significant hair loss so we switched her to exemestane 25 mg once daily 07/23/2015 2. Herceptin every 3 week started 04/10/2015 3. Bone metastases: Zometa every 3 months-next duelate May/early June 4. Radiation therapy to the spine (Dr. [name]) 04/13/2015 to 05/19/2015 5. Radiation therapy to left femur 04/28/2016 to 05/12/2016  6. SRS to the brain lesion 11/24/2016 7.Kadcyla started 05/05/2017 discontinued 07/27/2017 -------------------------------------------------------------------------------------------------------------------------- Current treatment:Palbociclib +Faslodex +trastuzumab+ Pertuzumabstarted 08/10/2017  Toxicities of palbociclib: 1.Neutropenia: Decrease dosage to 100 mg palbociclib, 3 weeks on 2 weeks off today's ANC is1.4 2.fatigue: Much improved since the dose was reduced  CT CAP 12/13/2017: Mediastinal adenopathy has regressed, left axillary adenopathy resolved, subcutaneous tumor regressed, no new or progressive bone metastases  Continue with Faslodex and Trastuzumab, Pertuzumabevery 4 weeks.

## 2018-01-01 MED FILL — IBRANCE 100 MG CAPSULE: 100 | 28 days supply | Qty: 21 | Fill #2

## 2018-01-02 ENCOUNTER — Other Ambulatory Visit: Payer: Self-pay | Admitting: Radiation Therapy

## 2018-01-02 DIAGNOSIS — C7931 Secondary malignant neoplasm of brain: Secondary | ICD-10-CM

## 2018-01-02 DIAGNOSIS — C7949 Secondary malignant neoplasm of other parts of nervous system: Secondary | ICD-10-CM

## 2018-01-09 ENCOUNTER — Other Ambulatory Visit: Payer: Self-pay | Admitting: Radiation Therapy

## 2018-01-10 DIAGNOSIS — I10 Essential (primary) hypertension: Secondary | ICD-10-CM | POA: Diagnosis not present

## 2018-01-10 DIAGNOSIS — C493 Malignant neoplasm of connective and soft tissue of thorax: Secondary | ICD-10-CM | POA: Diagnosis not present

## 2018-01-10 DIAGNOSIS — K219 Gastro-esophageal reflux disease without esophagitis: Secondary | ICD-10-CM | POA: Diagnosis not present

## 2018-01-10 DIAGNOSIS — M62838 Other muscle spasm: Secondary | ICD-10-CM | POA: Diagnosis not present

## 2018-01-10 DIAGNOSIS — R1310 Dysphagia, unspecified: Secondary | ICD-10-CM | POA: Diagnosis not present

## 2018-01-10 DIAGNOSIS — Z0000 Encounter for general adult medical examination without abnormal findings: Secondary | ICD-10-CM | POA: Diagnosis not present

## 2018-01-10 DIAGNOSIS — F419 Anxiety disorder, unspecified: Secondary | ICD-10-CM | POA: Diagnosis not present

## 2018-01-10 DIAGNOSIS — E559 Vitamin D deficiency, unspecified: Secondary | ICD-10-CM | POA: Diagnosis not present

## 2018-01-10 DIAGNOSIS — C50411 Malignant neoplasm of upper-outer quadrant of right female breast: Secondary | ICD-10-CM | POA: Diagnosis not present

## 2018-01-11 NOTE — Progress Notes (Signed)
Heather [name] 71 y.o.female withER positive breast cancer withpainful T8 spinal metastasis radiation completed 05-31-17, review 01-13-18 MRI brain w wo contrast, FU.             Pain:No Bowel/Bladder retention or incontinence: Taking Miralax ,reports urinary incontinence wearing a pattie liner. Numbness or weakness in extremities:Both legs are weak with numbness has neuropathy  Current Decadron regimen, if applicable:No  Fatigue:Lots of fatigue thinks it from the Ibrance Swallowing issues:Swallowing issues ongoing to see a specialist on Friday. Nausea/Vomiting:N0 Weight: Wt Readings from Last 3 Encounters:  01/17/18 133 lb 6.4 oz (60.5 kg)  12/20/17 130 lb 11.2 oz (59.3 kg)  11/16/17 130 lb 8 oz (59.2 kg)             Ambulatory status? Walker? Wheelchair?: Using                  a cane           BP 140/65 (BP Location: Left Arm)   Pulse 68   Temp 97.7 F (36.5 C) (Oral)   Resp 20   Ht 5\' 4"  (1.626 m)   Wt 133 lb 6.4 oz (60.5 kg)   SpO2 100%   BMI 22.90 kg/m

## 2018-01-13 ENCOUNTER — Ambulatory Visit
Admission: RE | Admit: 2018-01-13 | Discharge: 2018-01-13 | Disposition: A | Discharge status: Home / Self Care | Payer: Medicare Other | Source: Ambulatory Visit | Attending: Radiation Oncology | Admitting: Radiation Oncology

## 2018-01-13 DIAGNOSIS — C7949 Secondary malignant neoplasm of other parts of nervous system: Secondary | ICD-10-CM

## 2018-01-13 DIAGNOSIS — C7931 Secondary malignant neoplasm of brain: Secondary | ICD-10-CM | POA: Diagnosis not present

## 2018-01-13 DIAGNOSIS — C50919 Malignant neoplasm of unspecified site of unspecified female breast: Secondary | ICD-10-CM | POA: Diagnosis not present

## 2018-01-13 MED ORDER — GADOBENATE DIMEGLUMINE 529 MG/ML IV SOLN
12.0000 mL | Freq: Once | INTRAVENOUS | Status: AC | PRN
  Administered 2018-01-13: 12 mL via INTRAVENOUS

## 2018-01-15 ENCOUNTER — Other Ambulatory Visit: Payer: Medicare Other

## 2018-01-16 ENCOUNTER — Ambulatory Visit: Payer: Self-pay | Admitting: Urology

## 2018-01-17 ENCOUNTER — Ambulatory Visit
Admission: RE | Admit: 2018-01-17 | Discharge: 2018-01-17 | Disposition: A | Discharge status: Home / Self Care | Payer: Medicare Other | Source: Ambulatory Visit | Attending: Urology | Admitting: Urology

## 2018-01-17 ENCOUNTER — Other Ambulatory Visit: Payer: Self-pay

## 2018-01-17 VITALS — BP 140/65 | HR 68 | Temp 97.7°F | Resp 20 | Ht 64.0 in | Wt 133.4 lb

## 2018-01-17 DIAGNOSIS — Z79899 Other long term (current) drug therapy: Secondary | ICD-10-CM | POA: Insufficient documentation

## 2018-01-17 DIAGNOSIS — Z806 Family history of leukemia: Secondary | ICD-10-CM | POA: Insufficient documentation

## 2018-01-17 DIAGNOSIS — G629 Polyneuropathy, unspecified: Secondary | ICD-10-CM | POA: Diagnosis not present

## 2018-01-17 DIAGNOSIS — C7801 Secondary malignant neoplasm of right lung: Secondary | ICD-10-CM | POA: Diagnosis not present

## 2018-01-17 DIAGNOSIS — K219 Gastro-esophageal reflux disease without esophagitis: Secondary | ICD-10-CM | POA: Diagnosis not present

## 2018-01-17 DIAGNOSIS — C50911 Malignant neoplasm of unspecified site of right female breast: Secondary | ICD-10-CM | POA: Diagnosis not present

## 2018-01-17 DIAGNOSIS — C778 Secondary and unspecified malignant neoplasm of lymph nodes of multiple regions: Secondary | ICD-10-CM | POA: Insufficient documentation

## 2018-01-17 DIAGNOSIS — Z170 Estrogen receptor positive status [ER+]: Secondary | ICD-10-CM | POA: Diagnosis not present

## 2018-01-17 DIAGNOSIS — Z800 Family history of malignant neoplasm of digestive organs: Secondary | ICD-10-CM | POA: Diagnosis not present

## 2018-01-17 DIAGNOSIS — C7931 Secondary malignant neoplasm of brain: Secondary | ICD-10-CM | POA: Diagnosis not present

## 2018-01-17 DIAGNOSIS — Z87442 Personal history of urinary calculi: Secondary | ICD-10-CM | POA: Diagnosis not present

## 2018-01-17 DIAGNOSIS — F419 Anxiety disorder, unspecified: Secondary | ICD-10-CM | POA: Diagnosis not present

## 2018-01-17 DIAGNOSIS — Z8042 Family history of malignant neoplasm of prostate: Secondary | ICD-10-CM | POA: Diagnosis not present

## 2018-01-17 DIAGNOSIS — E785 Hyperlipidemia, unspecified: Secondary | ICD-10-CM | POA: Insufficient documentation

## 2018-01-17 DIAGNOSIS — I10 Essential (primary) hypertension: Secondary | ICD-10-CM | POA: Insufficient documentation

## 2018-01-17 DIAGNOSIS — C7951 Secondary malignant neoplasm of bone: Secondary | ICD-10-CM | POA: Insufficient documentation

## 2018-01-17 DIAGNOSIS — Z923 Personal history of irradiation: Secondary | ICD-10-CM | POA: Insufficient documentation

## 2018-01-17 DIAGNOSIS — R918 Other nonspecific abnormal finding of lung field: Secondary | ICD-10-CM | POA: Diagnosis not present

## 2018-01-17 NOTE — Progress Notes (Signed)
Brain and Spine Tumor Board Documentation  Heather [name] was presented by Cecil Cobbs, MD at Brain and Spine Tumor Board on 01/17/2018, which included representatives from neuro oncology, radiation oncology, surgical oncology, radiology, pathology, navigation, genetics.  Heather [name] was presented as a current patient with history of the following treatments:  .  Additionally, we reviewed previous medical and familial history, history of present illness, and recent lab results along with all available histopathologic and imaging studies. The tumor board considered available treatment options and made the following recommendations:  Active surveillance  Tumor board is a meeting of clinicians from various specialty areas who evaluate and discuss patients for whom a multidisciplinary approach is being considered. Final determinations in the plan of care are those of the provider(s). The responsibility for follow up of recommendations given during tumor board is that of the provider.   Today's extended care, comprehensive team conference, Heather [name] was not present for the discussion and was not examined.

## 2018-01-17 NOTE — Progress Notes (Signed)
Radiation Oncology         ([phone]) [phone] ________________________________  Name: GLENDY BARSANTI MRN: [id number]  Date: 01/17/2018  DOB: 1945/08/01  Post-treatment Note  CC: [name], MD  [name], MD  Diagnosis:   72 y.o. woman with ER positive, PR negative, HER-2 positive metastatic breast cancer with multifocal metastasis involving the spine, bony skeleton, lung, and brain.   Interval Since Last Radiation: 8 months   05/18/2017 - 05/31/2017:  The thoracic spine (T7-T9) was treated to 30 Gy in 10 fractions of 3 Gy.  6/27-7/18/18 SRS and palliative treatment: 1.  The rib 8th was treated to 30 Gy in 10 fractions 2.  Right cerebellar 6 mm target was treated using 3 Dynamic Conformal Arcs to a prescription dose of 20 Gy in one fraction  04/27/16-05/12/16: Left femur/ 30 Gy in 10 fractions  04/13/2015-05/19/2015 Postop:  55 Gy in 25 fractions of 2.2 Gy to the thoracic spine  10/25/12-12/11/12: 50.4 Gy to the right chest wall and regional lymph nodes in 28 fractions with a 10 Gy boost to the right chest wall in 1 session.  Narrative: Mrs. Scaglione is a well known patient to our service with a history of metastatic breast cancer originally treated in 2014 to the chest wall and regional lymph nodes. She developed recurrent disease within the thoracic spine and subsequently has undergone 3 surgeries, in addition to radiotherapy, with her most recent surgery being on 06/13/2016 where she had removal of thoracic hardware and replacement of pedicle screws with stabilization.  Of note previously placed screws between T3, T4 T6 and T7 were identified and dissection was carried out inferiorly to T8, T9-T10 and T11, T8 through T11 pedicle screw trajectories were placed bilaterally. She recovered well since treatment to her spine as she had been paralyzed in 2016, and with rigorous therapy has been able to make an impressive recovery. She continues on systemic therapy under the care of [name] and  also follows along in the brain and spine oncology conference. She was previously on Faslodex, Perjeta, and Herceptin systemic therapy since March of 2018, and had a repeat PET scan on 10/10/16 which revealed stable to smaller soft tissue nodules in the posterior thorax near the cervicothoracic junction and stable hypermetabolism in the previously treated right paraspinal disease at T8 but there was a new hypermetabolic 2.5 x 1.1 cm lesion in the posterior right 8th rib with an SUV of 8.9. She also mentioned having new onset headaches, dizziness, and an episode of visual change,so an MRI brain was performed 11/04/16 demonstrating a solitary contrast-enhancing lesion within the right cerebellum, measuring 6 x 2 mm and lying just beneath the right tentorial leaflet and concerning for a metastatic lesion.  This solitary lesion was confirmed with SRS protocol MRI brain on 11/16/16 with no new lesions noted. This lesion was treated with SRS on 11/23/16 which she tolerated very well. She also completed palliative radiotherapy to the right eight rib from 11/02/16 - 11/23/16 with significant improvement in her pain.   She had follow up imaging with MRI of the brain and thoracic spine on 03/15/17. Her case and imaging studies were reviewed at the multidisciplinary brain and spine conference 03/20/17 and were felt to be overall stable except for some progressive nodular enhancing soft tissue masses in the subcutaneous tissues posteriorly at the level of prior surgery and felt compatible with metastatic disease. The decision at that time was to continue to monitor the RLL mass to assess response to  chemotherapy and consider radiotherapy to that lesion only if no response or poor response to systemic therapy.  The right cerebellar metastatic disease was no longer visualized and there were no new lesions in the brain.   She remained on Herceptin, Perjeta, Faslodex and Zometa for systemic disease under the care and direction of Dr.  [name].  She developed increased pain in the T8 region and also had PET positive findings in this region, as well as a new right lower lobe mass concerning for progressive metastatic disease on follow up imaging 04/18/2017, despite her current systemic treatment. Her systemic therapy was switched to Kadcyla on 05/05/17 with discontinuation of Faslodex, Herceptin, and Perjeta.  She proceeded with palliative radiotherapy to 30 Gy in 10 fractions to the thoracic spine (T7-T9) from 05/18/2017 - 05/31/2017.  This treatment was tolerated very well.  She continued on the Kadcyla, which she tolerated relatively well. She also remained on Zometa q 3 month for bone metastasis. Repeat brain MRI performed 06/15/17 showed no new or progressive contrast enhancing lesions and no residual abnormality at the site of the previous treated right cerebellar lesion.    Follow up Chest CT on 07/26/17 showed clear disease progression with interval increase in size of the multiple enhancing masses posteriorly overlying the spine, interval development of left axillary and mediastinal adenopathy, increase in the size of lytic lesions in T8-T9 and left ninth rib, as well as increased groundglass opacities right lower lobe lung. Therefore, the Kadcyla was discontinued after 3 cycles and she was switched to Palbociclib + Faslodex + trastuzumab + Pertuzumab, started 08/10/17. She has toelrated her first 2 cycles relatively well with the exception of extreme fatigue due to significant neutropenia which improved with dose reduction of the palbociclib.   INTERVAL HISTORY:  She has had recent follow up imaging with MRI brain on 01/13/18 which showed continued interval increase in size the right parietal calvarium metastasis, now measuring 10 x 6 x 6 mm.  There is stable appearance in size of a right frontal calvarial metastasis measuring 5 x 3 mm and no parenchymal or dural metastases.  Her last MRI T-spine was performed on 09/21/17 showing the known  T12 lesion had slightly progressed by a few millimeters from 03/15/17 and there was a new lesion at L2 which was not present on lumbar MRI from 07/13/16. Otherwise, the treated disease at T8-T9 remained stable and there was decreased size of subcutaneous nodules over the upper thoracic spine.  On recent systemic imaging with CT C/A/P on 12/13/17, there was stable appearance of the mixed lytic and sclerotic lesions involving L2 and T12 and no evidence of new osseous metastatic disease. Overall appearance on systemic imaging showed a good response to treatment with improved LAN and decreased subcutaneous nodules in the upper thoracic region.  On review of systems, the patient reports that she continues doing well overall.  She continues with modest fatigue which is tolerable and not significantly interfering with her daily activities.  She remains active.  She has also noticed improvement in the burning/tingling pain that radiates from the left aspect of the spine at the level of T8-9 and radiates underneath her breast since completing radiotherapy.  She has not had recent changes in visual or auditory acuity, tinnitus, increased imbalance, tremor or seizure activity.  She has had a few headaches behind her left eye which she attributed to sinus pressure and resolved with OTC medication.  She has not had a headache in the past week.  She  denies any sternal chest pain, shortness of breath, cough, fevers, chills, night sweats, or unintended weight changes. She denies any bowel or bladder disturbances, and denies abdominal pain, nausea or vomiting. She denies any new musculoskeletal or joint aches or pains, new skin lesions or concerns. She has noted a continued decrease in size in the subcutaneous nodules on her back.  A complete review of systems is obtained and is otherwise negative.  Past Medical History:  Past Medical History:  Diagnosis Date  . Anemia    hx of  . Anxiety    r/t updated surgery  . Breast  cancer ([place])    right  . Cataract    immature-not sure of which eye  . Complication of anesthesia    pt states she is sensitive to meds  . Dizziness    has been going on for 39month;medical MD aware  . Genetic testing 07/11/2016   Test Results: Normal; No pathogenic mutations detected  Genes Analyzed: 43 genes on [employer] Common Cancers panel (APC, ATM, AXIN2, BARD1, BMPR1A, BRCA1, BRCA2, BRIP1, CDH1, CDKN2A, CHEK2, DICER1, EPCAM, GREM1, HOXB13, KIT, MEN1, MLH1, MSH2, MSH6, MUTYH, NBN, NF1, PALB2, PDGFRA, PMS2, POLD1, POLE, PTEN, RAD50, RAD51C, RAD51D, SDHA, SDHB, SDHC, SDHD, SMAD4, SMARCA4, STK11, TP53, TSC1, TSC2, VHL).  .Marland KitchenGERD (gastroesophageal reflux disease)    Tums prn  . H. pylori infection   . H/O hiatal hernia   . Hemorrhoids   . History of UTI   . Hx of radiation therapy 10/25/12- 12/11/12   right chest wall/regional lymph nodes 5040 cGy, 28 sessions, right chest wall boost 1000 cGy 1 session  . Hx: UTI (urinary tract infection)   . Hyperlipidemia    but not on meds;diet and exercise controlled  . Hypertension    recently started Aldactone   . Insomnia    takes Melatoniin daily  . Metastasis to spinal column ([facility])    T5, Left  Femur cancer- radiation   . Neuropathy    "from back surgery"  . PONV (postoperative nausea and vomiting)    pt experienced hair loss, confusion and combative- 02/15/15  . Right knee pain   . Right shoulder pain   . Skin cancer    squamous, Nodule to back - "same cancer as breast."  . Urinary frequency    d/t taking Aldactone    Past Surgical History: Past Surgical History:  Procedure Laterality Date  . COLONOSCOPY    . cyst removed from left breast  1970  . DECOMPRESSIVE LUMBAR LAMINECTOMY LEVEL 4 N/A 02/15/2015   Procedure: DECOMPRESSION T5 AND T3-T7 STABALIZATION;  Surgeon: NConsuella Lose MD;  [facility];  Service: Neurosurgery;  Laterality: N/A;  . ESOPHAGOGASTRODUODENOSCOPY    . LAMINECTOMY N/A 03/27/2015   Procedure:  Thoracic Four-Thoracic Six Laminectomy for tumor;  Surgeon: NConsuella Lose MD;  [place] [facility];  Service: Neurosurgery;  Laterality: N/A;  T4-T6 Laminectomy  . left wrist surgery   2004   with plate  . MASTECTOMY MODIFIED RADICAL  05/10/2012   Procedure: MASTECTOMY MODIFIED RADICAL;  Surgeon: PMerrie Roof MD;  [facility]  Service: General;  Laterality: Right;  RIGHT MODIFIED RADICAL MASTECTOMY  . MASTECTOMY, RADICAL Right   . MINOR BREAST BIOPSY Left 05/16/2016   Procedure: EXCISION OF BACK NODULE;  Surgeon: PAutumn MessingIII, MD;  [facility]  Service: General;  Laterality: Left;  . Nodule  back  05/17/2015  . PORTACATH PLACEMENT  05/10/2012   Procedure:  INSERTION PORT-A-CATH;  Surgeon: Merrie Roof, MD;  Location[facility];  Service: General;  Laterality: Left;    Social History:  Social History   Socioeconomic History  . Marital status: Married    Spouse name: Not on file  . Number of children: 2  . Years of education: Not on file  . Highest education level: Not on file  Occupational History  . Not on file  Social Needs  . Financial resource strain: Not on file  . Food insecurity:    Worry: Not on file    Inability: Not on file  . Transportation needs:    Medical: Not on file    Non-medical: Not on file  Tobacco Use  . Smoking status: Never Smoker  . Smokeless tobacco: Never Used  Substance and Sexual Activity  . Alcohol use: No  . Drug use: No  . Sexual activity: Yes    Birth control/protection: Post-menopausal  Lifestyle  . Physical activity:    Days per week: Not on file    Minutes per session: Not on file  . Stress: Not on file  Relationships  . Social connections:    Talks on phone: Not on file    Gets together: Not on file    Attends religious service: Not on file    Active member of club or organization: Not on file    Attends meetings of clubs or organizations: Not on file    Relationship status: Not on file  .  Intimate partner violence:    Fear of current or ex partner: Not on file    Emotionally abused: Not on file    Physically abused: Not on file    Forced sexual activity: Not on file  Other Topics Concern  . Not on file  Social History Narrative  . Not on file    Family History: Family History  Problem Relation Age of Onset  . Leukemia Mother 30       deceased 32  . Stroke Father   . Diabetes Mellitus II Father   . Prostate cancer Brother 25       currently 58  . Stomach cancer Maternal Grandmother 69       deceased 74  . Prostate cancer Brother 15       currently 32     ALLERGIES:  is allergic to other; acyclovir and related; zanaflex [tizanidine hcl]; codeine; keflex [cephalexin]; naprosyn [naproxen]; oxycodone; tramadol; and tussin [guaifenesin].  Meds: Current Outpatient Medications  Medication Sig Dispense Refill  . ALPRAZolam (XANAX) 0.25 MG tablet Take 1 tablet by mouth as needed.    Marland Kitchen b complex vitamins tablet Take 1 tablet by mouth daily.    . Calcium Carbonate Antacid (TUMS PO) Take 2 tablets by mouth 2 (two) times daily as needed (acid reflux).    . Cholecalciferol (VITAMIN D) 2000 UNITS tablet Take 2,000 Units by mouth daily.    . Coenzyme Q10 (COQ10) 100 MG CAPS Take 100 mg by mouth as needed. Days that she takes pravastatin MWF.    Marland Kitchen gabapentin (NEURONTIN) 600 MG tablet Take 1 tablet (600 mg total) by mouth as directed. Take 1 tablet in the AM and afternoon and 1 1/2 tabs at bedtime (Patient taking differently: Take 600 mg by mouth 3 (three) times daily. Take 1 tablet in the AM and afternoon and 1 1/2 tabs at bedtime) 315 tablet 2  . lidocaine-prilocaine (EMLA) cream Apply to affected area once (Patient taking differently: Apply 1  application topically every 30 (thirty) days. Apply to port prior to infusions) 30 g 3  . OVER THE COUNTER MEDICATION Place 1 drop into both eyes 2 (two) times daily as needed (dry eyes). Over the counter lubricating eye drops    .  palbociclib (IBRANCE) 100 MG capsule Take 1 capsule (100 mg total) by mouth daily with breakfast. Take for 21 days on, 14 days off, repeat every 35 days. Take whole with food. 21 capsule 6  . polyethylene glycol (MIRALAX / GLYCOLAX) packet Take 17 g by mouth daily as needed.    . Probiotic Product (PROBIOTIC DAILY PO) Take 1 capsule by mouth daily.     . ranitidine (ZANTAC) 300 MG tablet Take 300 mg by mouth at bedtime. Reported on 06/25/2015    . spironolactone (ALDACTONE) 25 MG tablet Take 25 mg by mouth daily.    . fluticasone (FLONASE) 50 MCG/ACT nasal spray Place 1 spray into both nostrils daily as needed.    Marland Kitchen HYDROcodone-acetaminophen (NORCO/VICODIN) 5-325 MG tablet      No current facility-administered medications for this encounter.    Facility-Administered Medications Ordered in Other Encounters  Medication Dose Route Frequency Provider Last Rate Last Dose  . sodium chloride 0.9 % injection 10 mL  10 mL Intravenous PRN [name], MD   10 mL at 04/26/17 2542    Physical Findings:  height is 5' 4" (1.626 m) and weight is 133 lb 6.4 oz (60.5 kg). Her oral temperature is 97.7 F (36.5 C). Her blood pressure is 140/65 and her pulse is 68. Her respiration is 20 and oxygen saturation is 100%.  Pain Assessment Pain Score: 0-No pain/10 In general this is a well appearing Caucasian female in no acute distress. She's alert and oriented x4 and appropriate throughout the examination. Cardiopulmonary assessment is negative for acute distress and she exhibits normal effort. She appears grossly neurologically intact.  PERRLA.  EOMs are intact bilaterally. Speech is clear and well organized, without evidence of difficulty with word finding.  There are palpable subcutaneous nodules bilaterally along T3-T4 and bilaterally at T8-T9 which are appreciably decreased in size from last exam.  She is non-tender to palpate midline over T12 - L2.  She has FROM of bilateral upper and lower extremities.   Sensation is intact to light touch and strength is 5/5 and equal in the upper and lower extremities.     Lab Findings: Lab Results  Component Value Date   WBC 2.3 (L) 11/16/2017   HGB 11.7 11/16/2017   HCT 34.2 (L) 11/16/2017   MCV 95.9 11/16/2017   PLT 188 11/16/2017     Radiographic Findings: Mr Jeri Cos HC Contrast  Result Date: 01/14/2018 CLINICAL DATA:  Secondary malignant neoplasm the brain spinal cord. Breast cancer. Status post SRS. Creatinine was obtained on site at [place] at [address] Results: Creatinine 0.9 mg/dL. EXAM: MRI HEAD WITHOUT AND WITH CONTRAST TECHNIQUE: Multiplanar, multiecho pulse sequences of the brain and surrounding structures were obtained without and with intravenous contrast. CONTRAST:  66m MULTIHANCE GADOBENATE DIMEGLUMINE 529 MG/ML IV SOLN COMPARISON:  None. FINDINGS: Brain: No acute infarct, hemorrhage, or mass lesion is present. The ventricles are of normal size. Mild subcortical white matter T2 hyperintensities bilaterally are stable. The ventricles are of normal size. No significant extra-axial fluid collection is present. Postcontrast images demonstrate no pathologic enhancement of brain or meninges. No residual or recurrent parenchymal tumor is present. Vascular: Flow is present in the major intracranial arteries. Skull and  upper cervical spine: Right parietal calvarium metastasis demonstrates continued interval growth, now measuring 10 x 6 x 6 mm. 5 x 3 mm right frontal calvarial on image 106 of series 13 is not significantly changed. No other focal calvarium is present. Craniocervical junction is within normal limits upper cervical spine is unremarkable. Sinuses/Orbits: The paranasal sinuses and mastoid air cells are clear. Globes and orbits are within normal limits. IMPRESSION: 1. Continued interval increase in size right parietal calvarium metastasis, now measuring 10 x 6 x 6 mm. 2. Stable size of right frontal calvarial metastasis  measuring 5 x 3 mm. 3. No parenchymal or dural metastases. Electronically Signed   By: San Morelle M.D.   On: 01/14/2018 10:07    Impression/Plan: 73. 72 yo woman with ER positive breast cancer with a solitary brain metastasis, bony metastatses and concerning pulmonary nodule.  Regarding her metastatic brain disease, she appears to be clinically stable despite radiographic progression of right parietal calvarial metastasis on recent follow up brain imaging.  MRI brain on 01/13/18 showed continued interval increase in size the right parietal calvarium metastasis, now measuring 10 x 6 x 6 mm with stable appearance in size of a right frontal calvarial metastasis measuring 5 x 3 mm and no parenchymal or dural metastases.  As she is currently asymptomatic regarding the calvarial lesions and without evidence of new or progressive parenchymal lesions, as per recommendations from recent multidisciplinary brain and spine conference, we will continue to monitor her closely with serial brain MRI scans every 3 months and office follow-up thereafter to review results.  Regarding her systemic disease, she will continue in routine follow-up for disease management under the care and direction of [name] with whom she has an upcoming appointment on 03/01/2018.  We are hopeful that these lesions will respond with continued systemic therapy.  It was discussed with the patient that it would be at Dr. Geralyn Flash discretion as to whether there would be any changes made to her current systemic therapy regimen. 2. Spine metastases.  On recent systemic imaging with CT C/A/P on 12/13/17, there was stable appearance of the mixed lytic and sclerotic lesions involving L2 and T12 and no evidence of new osseous metastatic disease.  She is not currently symptomatic, so, as per consensus recommendation from multidisciplinary conference 01/15/18, we will continue with observation and will alternate further spine imaging for surveillance  with systemic imaging as ordered by [name].  She will be due for repeat thoracic imaging in late Novemeber so we will coordinate this with her upcoming brain imaging unless she has further systemic imaging prior to that time. 3. Hypermetabolic right lower lobe mass, 4 cm.  This was identified on recent PET scan from April 18, 2017.  The decision at that time was to make a change in her systemic therapy and hold off on radiotherapy until further assessment of her treatment response.  Recent follow up systemic imaging with CT Chest on 12/13/17 demonstrated an excellent response to her systemic treatment with marked improved appearance of the mediastinal lymphadenopathy and no worrisome pulmonary lesions or metastatic pulmonary nodules. We will continue to follow this expectantly and should this lesion progress despite systemic therapy or the patient become symptomatic, we would be happy to offer radiotherapy. 4. Skin lesion behind right ear.  This lesion has resolved spontaneously and was felt to be secondary to her systemic therapy.     Nicholos Johns, PA-C

## 2018-01-19 ENCOUNTER — Other Ambulatory Visit: Payer: Self-pay | Admitting: Physician Assistant

## 2018-01-19 DIAGNOSIS — R1314 Dysphagia, pharyngoesophageal phase: Secondary | ICD-10-CM | POA: Diagnosis not present

## 2018-01-22 ENCOUNTER — Ambulatory Visit
Admission: RE | Admit: 2018-01-22 | Discharge: 2018-01-22 | Disposition: A | Discharge status: Home / Self Care | Payer: Medicare Other | Source: Ambulatory Visit | Attending: Physician Assistant | Admitting: Physician Assistant

## 2018-01-22 DIAGNOSIS — R1310 Dysphagia, unspecified: Secondary | ICD-10-CM | POA: Diagnosis not present

## 2018-01-22 DIAGNOSIS — R1314 Dysphagia, pharyngoesophageal phase: Secondary | ICD-10-CM

## 2018-01-24 ENCOUNTER — Inpatient Hospital Stay: Payer: Medicare Other

## 2018-01-24 ENCOUNTER — Inpatient Hospital Stay: Payer: Medicare Other | Attending: Hematology and Oncology

## 2018-01-24 VITALS — BP 156/61 | HR 61 | Temp 97.8°F | Resp 18

## 2018-01-24 DIAGNOSIS — Z170 Estrogen receptor positive status [ER+]: Secondary | ICD-10-CM | POA: Diagnosis not present

## 2018-01-24 DIAGNOSIS — Z923 Personal history of irradiation: Secondary | ICD-10-CM | POA: Diagnosis not present

## 2018-01-24 DIAGNOSIS — M62838 Other muscle spasm: Secondary | ICD-10-CM | POA: Diagnosis not present

## 2018-01-24 DIAGNOSIS — Z79811 Long term (current) use of aromatase inhibitors: Secondary | ICD-10-CM | POA: Diagnosis not present

## 2018-01-24 DIAGNOSIS — Z23 Encounter for immunization: Secondary | ICD-10-CM

## 2018-01-24 DIAGNOSIS — C7951 Secondary malignant neoplasm of bone: Secondary | ICD-10-CM

## 2018-01-24 DIAGNOSIS — Z9011 Acquired absence of right breast and nipple: Secondary | ICD-10-CM | POA: Insufficient documentation

## 2018-01-24 DIAGNOSIS — Z9221 Personal history of antineoplastic chemotherapy: Secondary | ICD-10-CM | POA: Diagnosis not present

## 2018-01-24 DIAGNOSIS — D709 Neutropenia, unspecified: Secondary | ICD-10-CM | POA: Diagnosis not present

## 2018-01-24 DIAGNOSIS — E559 Vitamin D deficiency, unspecified: Secondary | ICD-10-CM | POA: Diagnosis not present

## 2018-01-24 DIAGNOSIS — C50411 Malignant neoplasm of upper-outer quadrant of right female breast: Secondary | ICD-10-CM

## 2018-01-24 DIAGNOSIS — Z5112 Encounter for antineoplastic immunotherapy: Secondary | ICD-10-CM | POA: Diagnosis not present

## 2018-01-24 DIAGNOSIS — I10 Essential (primary) hypertension: Secondary | ICD-10-CM | POA: Diagnosis not present

## 2018-01-24 LAB — CMP (CANCER CENTER ONLY)
ALT: 16 U/L (ref 0–44)
AST: 29 U/L (ref 15–41)
Albumin: 4 g/dL (ref 3.5–5.0)
Alkaline Phosphatase: 73 U/L (ref 38–126)
Anion gap: 10 (ref 5–15)
BUN: 14 mg/dL (ref 8–23)
CO2: 27 mmol/L (ref 22–32)
Calcium: 10.1 mg/dL (ref 8.9–10.3)
Chloride: 102 mmol/L (ref 98–111)
Creatinine: 0.86 mg/dL (ref 0.44–1.00)
GFR, Est AFR Am: >60 mL/min
GFR, Estimated: >60 mL/min
Glucose, Bld: 100 mg/dL — ABNORMAL HIGH (ref 70–99)
Potassium: 3.9 mmol/L (ref 3.5–5.1)
Sodium: 139 mmol/L (ref 135–145)
Total Bilirubin: 0.6 mg/dL (ref 0.3–1.2)
Total Protein: 7.4 g/dL (ref 6.5–8.1)

## 2018-01-24 LAB — CBC WITH DIFFERENTIAL (CANCER CENTER ONLY)
Basophils Absolute: 0.1 10*3/uL (ref 0.0–0.1)
Basophils Relative: 5 %
Eosinophils Absolute: 0 10*3/uL (ref 0.0–0.5)
Eosinophils Relative: 1 %
HCT: 35.6 % (ref 34.8–46.6)
Hemoglobin: 12 g/dL (ref 11.6–15.9)
Lymphocytes Relative: 19 %
Lymphs Abs: 0.5 10*3/uL — ABNORMAL LOW (ref 0.9–3.3)
MCH: 33.7 pg (ref 25.1–34.0)
MCHC: 33.7 g/dL (ref 31.5–36.0)
MCV: 100 fL (ref 79.5–101.0)
Monocytes Absolute: 0.4 10*3/uL (ref 0.1–0.9)
Monocytes Relative: 18 %
Neutro Abs: 1.3 10*3/uL — ABNORMAL LOW (ref 1.5–6.5)
Neutrophils Relative %: 57 %
Platelet Count: 158 10*3/uL (ref 145–400)
RBC: 3.56 MIL/uL — ABNORMAL LOW (ref 3.70–5.45)
RDW: 17 % — ABNORMAL HIGH (ref 11.2–14.5)
WBC Count: 2.3 10*3/uL — ABNORMAL LOW (ref 3.9–10.3)

## 2018-01-24 MED ORDER — SODIUM CHLORIDE 0.9 % IV SOLN
Freq: Once | INTRAVENOUS | Status: AC
  Administered 2018-01-24: 10:00:00 via INTRAVENOUS
  Filled 2018-01-24: qty 250

## 2018-01-24 MED ORDER — TRASTUZUMAB CHEMO 150 MG IV SOLR
6.0000 mg/kg | Freq: Once | INTRAVENOUS | Status: AC
  Administered 2018-01-24: 357 mg via INTRAVENOUS
  Filled 2018-01-24: qty 17

## 2018-01-24 MED ORDER — CYANOCOBALAMIN 1000 MCG/ML IJ SOLN
1000.0000 ug | Freq: Once | INTRAMUSCULAR | Status: AC
  Administered 2018-01-24: 1000 ug via INTRAMUSCULAR

## 2018-01-24 MED ORDER — CYANOCOBALAMIN 1000 MCG/ML IJ SOLN
INTRAMUSCULAR | Status: AC
  Filled 2018-01-24: qty 1

## 2018-01-24 MED ORDER — ZOLEDRONIC ACID 4 MG/100ML IV SOLN
4.0000 mg | Freq: Once | INTRAVENOUS | Status: AC
  Administered 2018-01-24: 4 mg via INTRAVENOUS
  Filled 2018-01-24: qty 100

## 2018-01-24 MED ORDER — FULVESTRANT 250 MG/5ML IM SOLN
INTRAMUSCULAR | Status: AC
  Filled 2018-01-24: qty 5

## 2018-01-24 MED ORDER — INFLUENZA VAC SPLIT QUAD 0.5 ML IM SUSY
PREFILLED_SYRINGE | INTRAMUSCULAR | Status: AC
  Filled 2018-01-24: qty 0.5

## 2018-01-24 MED ORDER — ACETAMINOPHEN 325 MG PO TABS
650.0000 mg | ORAL_TABLET | Freq: Once | ORAL | Status: AC
  Administered 2018-01-24: 650 mg via ORAL

## 2018-01-24 MED ORDER — SODIUM CHLORIDE 0.9 % IJ SOLN
10.0000 mL | INTRAMUSCULAR | Status: DC | PRN
  Administered 2018-01-24: 10 mL via INTRAVENOUS
  Filled 2018-01-24: qty 10

## 2018-01-24 MED ORDER — SODIUM CHLORIDE 0.9% FLUSH
10.0000 mL | INTRAVENOUS | Status: DC | PRN
  Administered 2018-01-24: 10 mL
  Filled 2018-01-24: qty 10

## 2018-01-24 MED ORDER — FULVESTRANT 250 MG/5ML IM SOLN
500.0000 mg | Freq: Once | INTRAMUSCULAR | Status: AC
  Administered 2018-01-24: 500 mg via INTRAMUSCULAR

## 2018-01-24 MED ORDER — SODIUM CHLORIDE 0.9 % IV SOLN
420.0000 mg | Freq: Once | INTRAVENOUS | Status: AC
  Administered 2018-01-24: 420 mg via INTRAVENOUS
  Filled 2018-01-24: qty 14

## 2018-01-24 MED ORDER — HEPARIN SOD (PORK) LOCK FLUSH 100 UNIT/ML IV SOLN
500.0000 [IU] | Freq: Once | INTRAVENOUS | Status: AC | PRN
  Administered 2018-01-24: 500 [IU]
  Filled 2018-01-24: qty 5

## 2018-01-24 MED ORDER — ACETAMINOPHEN 325 MG PO TABS
ORAL_TABLET | ORAL | Status: AC
  Filled 2018-01-24: qty 2

## 2018-01-24 MED ORDER — INFLUENZA VAC SPLIT QUAD 0.5 ML IM SUSY
0.5000 mL | PREFILLED_SYRINGE | Freq: Once | INTRAMUSCULAR | Status: AC
  Administered 2018-01-24: 0.5 mL via INTRAMUSCULAR

## 2018-01-24 NOTE — Patient Instructions (Addendum)
[facility] Discharge Instructions for Patients Receiving Chemotherapy  Today you received the following chemotherapy agents Herceptin and Perjeta  To help prevent nausea and vomiting after your treatment, we encourage you to take your nausea medication as directed.    If you develop nausea and vomiting that is not controlled by your nausea medication, call the clinic.   BELOW ARE SYMPTOMS THAT SHOULD BE REPORTED IMMEDIATELY:  *FEVER GREATER THAN 100.5 F  *CHILLS WITH OR WITHOUT FEVER  NAUSEA AND VOMITING THAT IS NOT CONTROLLED WITH YOUR NAUSEA MEDICATION  *UNUSUAL SHORTNESS OF BREATH  *UNUSUAL BRUISING OR BLEEDING  TENDERNESS IN MOUTH AND THROAT WITH OR WITHOUT PRESENCE OF ULCERS  *URINARY PROBLEMS  *BOWEL PROBLEMS  UNUSUAL RASH Items with * indicate a potential emergency and should be followed up as soon as possible.  Feel free to call the clinic should you have any questions or concerns. The clinic phone number is [phone].  Please show the [facility] at check-in to the Emergency Department and triage nurse.   Zoledronic Acid injection (Hypercalcemia, Oncology) What is this medicine? ZOLEDRONIC ACID (ZOE le dron ik AS id) lowers the amount of calcium loss from bone. It is used to treat too much calcium in your blood from cancer. It is also used to prevent complications of cancer that has spread to the bone. This medicine may be used for other purposes; ask your health care provider or pharmacist if you have questions. COMMON BRAND NAME(S): Zometa What should I tell my health care provider before I take this medicine? They need to know if you have any of these conditions: -aspirin-sensitive asthma -cancer, especially if you are receiving medicines used to treat cancer -dental disease or wear dentures -infection -kidney disease -receiving corticosteroids like dexamethasone or prednisone -an unusual or allergic reaction to zoledronic acid,  other medicines, foods, dyes, or preservatives -pregnant or trying to get pregnant -breast-feeding How should I use this medicine? This medicine is for infusion into a vein. It is given by a health care professional in a hospital or clinic setting. Talk to your pediatrician regarding the use of this medicine in children. Special care may be needed. Overdosage: If you think you have taken too much of this medicine contact a poison control center or emergency room at once. NOTE: This medicine is only for you. Do not share this medicine with others. What if I miss a dose? It is important not to miss your dose. Call your doctor or health care professional if you are unable to keep an appointment. What may interact with this medicine? -certain antibiotics given by injection -NSAIDs, medicines for pain and inflammation, like ibuprofen or naproxen -some diuretics like bumetanide, furosemide -teriparatide -thalidomide This list may not describe all possible interactions. Give your health care provider a list of all the medicines, herbs, non-prescription drugs, or dietary supplements you use. Also tell them if you smoke, drink alcohol, or use illegal drugs. Some items may interact with your medicine. What should I watch for while using this medicine? Visit your doctor or health care professional for regular checkups. It may be some time before you see the benefit from this medicine. Do not stop taking your medicine unless your doctor tells you to. Your doctor may order blood tests or other tests to see how you are doing. Women should inform their doctor if they wish to become pregnant or think they might be pregnant. There is a potential for serious side effects to an  unborn child. Talk to your health care professional or pharmacist for more information. You should make sure that you get enough calcium and vitamin D while you are taking this medicine. Discuss the foods you eat and the vitamins you take  with your health care professional. Some people who take this medicine have severe bone, joint, and/or muscle pain. This medicine may also increase your risk for jaw problems or a broken thigh bone. Tell your doctor right away if you have severe pain in your jaw, bones, joints, or muscles. Tell your doctor if you have any pain that does not go away or that gets worse. Tell your dentist and dental surgeon that you are taking this medicine. You should not have major dental surgery while on this medicine. See your dentist to have a dental exam and fix any dental problems before starting this medicine. Take good care of your teeth while on this medicine. Make sure you see your dentist for regular follow-up appointments. What side effects may I notice from receiving this medicine? Side effects that you should report to your doctor or health care professional as soon as possible: -allergic reactions like skin rash, itching or hives, swelling of the face, lips, or tongue -anxiety, confusion, or depression -breathing problems -changes in vision -eye pain -feeling faint or lightheaded, falls -jaw pain, especially after dental work -mouth sores -muscle cramps, stiffness, or weakness -redness, blistering, peeling or loosening of the skin, including inside the mouth -trouble passing urine or change in the amount of urine Side effects that usually do not require medical attention (report to your doctor or health care professional if they continue or are bothersome): -bone, joint, or muscle pain -constipation -diarrhea -fever -hair loss -irritation at site where injected -loss of appetite -nausea, vomiting -stomach upset -trouble sleeping -trouble swallowing -weak or tired This list may not describe all possible side effects. Call your doctor for medical advice about side effects. You may report side effects to FDA at 1-800-FDA-1088. Where should I keep my medicine? This drug is given in a hospital  or clinic and will not be stored at home. NOTE: This sheet is a summary. It may not cover all possible information. If you have questions about this medicine, talk to your doctor, pharmacist, or health care provider.  2018 Elsevier/Gold Standard (2013-09-21 14:19:39)

## 2018-02-01 MED FILL — IBRANCE 100 MG CAPSULE: 100 | 28 days supply | Qty: 21 | Fill #3

## 2018-02-26 DIAGNOSIS — K219 Gastro-esophageal reflux disease without esophagitis: Secondary | ICD-10-CM | POA: Diagnosis not present

## 2018-02-26 DIAGNOSIS — R1314 Dysphagia, pharyngoesophageal phase: Secondary | ICD-10-CM | POA: Diagnosis not present

## 2018-03-01 ENCOUNTER — Inpatient Hospital Stay: Payer: Medicare Other

## 2018-03-01 ENCOUNTER — Inpatient Hospital Stay: Payer: Medicare Other | Attending: Hematology and Oncology

## 2018-03-01 ENCOUNTER — Inpatient Hospital Stay (HOSPITAL_BASED_OUTPATIENT_CLINIC_OR_DEPARTMENT_OTHER): Payer: Medicare Other | Admitting: Hematology and Oncology

## 2018-03-01 VITALS — BP 162/68 | HR 71 | Temp 97.5°F | Resp 17 | Ht 64.0 in | Wt 137.3 lb

## 2018-03-01 DIAGNOSIS — Z5112 Encounter for antineoplastic immunotherapy: Secondary | ICD-10-CM | POA: Diagnosis not present

## 2018-03-01 DIAGNOSIS — Z79899 Other long term (current) drug therapy: Secondary | ICD-10-CM | POA: Diagnosis not present

## 2018-03-01 DIAGNOSIS — Z170 Estrogen receptor positive status [ER+]: Secondary | ICD-10-CM | POA: Insufficient documentation

## 2018-03-01 DIAGNOSIS — C50411 Malignant neoplasm of upper-outer quadrant of right female breast: Secondary | ICD-10-CM

## 2018-03-01 DIAGNOSIS — C7951 Secondary malignant neoplasm of bone: Secondary | ICD-10-CM

## 2018-03-01 DIAGNOSIS — Z9011 Acquired absence of right breast and nipple: Secondary | ICD-10-CM

## 2018-03-01 DIAGNOSIS — G939 Disorder of brain, unspecified: Secondary | ICD-10-CM | POA: Insufficient documentation

## 2018-03-01 DIAGNOSIS — Z9221 Personal history of antineoplastic chemotherapy: Secondary | ICD-10-CM | POA: Diagnosis not present

## 2018-03-01 DIAGNOSIS — Z79811 Long term (current) use of aromatase inhibitors: Secondary | ICD-10-CM | POA: Diagnosis not present

## 2018-03-01 DIAGNOSIS — Z923 Personal history of irradiation: Secondary | ICD-10-CM | POA: Diagnosis not present

## 2018-03-01 DIAGNOSIS — C50911 Malignant neoplasm of unspecified site of right female breast: Secondary | ICD-10-CM

## 2018-03-01 LAB — CBC WITH DIFFERENTIAL (CANCER CENTER ONLY)
Abs Immature Granulocytes: 0.02 10*3/uL (ref 0.00–0.07)
Basophils Absolute: 0.1 10*3/uL (ref 0.0–0.1)
Basophils Relative: 2 %
Eosinophils Absolute: 0.1 10*3/uL (ref 0.0–0.5)
Eosinophils Relative: 2 %
HCT: 36.1 % (ref 36.0–46.0)
Hemoglobin: 12.1 g/dL (ref 12.0–15.0)
Immature Granulocytes: 1 %
Lymphocytes Relative: 14 %
Lymphs Abs: 0.6 10*3/uL — ABNORMAL LOW (ref 0.7–4.0)
MCH: 33.2 pg (ref 26.0–34.0)
MCHC: 33.5 g/dL (ref 30.0–36.0)
MCV: 98.9 fL (ref 80.0–100.0)
Monocytes Absolute: 0.5 10*3/uL (ref 0.1–1.0)
Monocytes Relative: 13 %
Neutro Abs: 2.8 10*3/uL (ref 1.7–7.7)
Neutrophils Relative %: 68 %
Platelet Count: 193 10*3/uL (ref 150–400)
RBC: 3.65 MIL/uL — ABNORMAL LOW (ref 3.87–5.11)
RDW: 15.8 % — ABNORMAL HIGH (ref 11.5–15.5)
WBC Count: 4.1 10*3/uL (ref 4.0–10.5)
nRBC: 0 % (ref 0.0–0.2)

## 2018-03-01 LAB — CMP (CANCER CENTER ONLY)
ALT: 33 U/L (ref 0–44)
AST: 42 U/L — ABNORMAL HIGH (ref 15–41)
Albumin: 3.9 g/dL (ref 3.5–5.0)
Alkaline Phosphatase: 81 U/L (ref 38–126)
Anion gap: 11 (ref 5–15)
BUN: 13 mg/dL (ref 8–23)
CO2: 28 mmol/L (ref 22–32)
Calcium: 10.7 mg/dL — ABNORMAL HIGH (ref 8.9–10.3)
Chloride: 96 mmol/L — ABNORMAL LOW (ref 98–111)
Creatinine: 0.84 mg/dL (ref 0.44–1.00)
GFR, Est AFR Am: >60 mL/min (ref >60)
GFR, Estimated: >60 mL/min (ref >60)
Glucose, Bld: 103 mg/dL — ABNORMAL HIGH (ref 70–99)
Potassium: 4 mmol/L (ref 3.5–5.1)
Sodium: 135 mmol/L (ref 135–145)
Total Bilirubin: 0.4 mg/dL (ref 0.3–1.2)
Total Protein: 7.6 g/dL (ref 6.5–8.1)

## 2018-03-01 MED ORDER — HEPARIN SOD (PORK) LOCK FLUSH 100 UNIT/ML IV SOLN
500.0000 [IU] | Freq: Once | INTRAVENOUS | Status: AC | PRN
  Administered 2018-03-01: 500 [IU] via INTRAVENOUS
  Filled 2018-03-01: qty 5

## 2018-03-01 MED ORDER — FULVESTRANT 250 MG/5ML IM SOLN
500.0000 mg | Freq: Once | INTRAMUSCULAR | Status: AC
  Administered 2018-03-01: 500 mg via INTRAMUSCULAR

## 2018-03-01 MED ORDER — ACETAMINOPHEN 325 MG PO TABS
ORAL_TABLET | ORAL | Status: AC
  Filled 2018-03-01: qty 2

## 2018-03-01 MED ORDER — SODIUM CHLORIDE 0.9 % IJ SOLN
10.0000 mL | INTRAMUSCULAR | Status: DC | PRN
  Administered 2018-03-01: 10 mL
  Filled 2018-03-01: qty 10

## 2018-03-01 MED ORDER — HEPARIN SOD (PORK) LOCK FLUSH 100 UNIT/ML IV SOLN
500.0000 [IU] | Freq: Once | INTRAVENOUS | Status: DC | PRN
  Filled 2018-03-01: qty 5

## 2018-03-01 MED ORDER — TRASTUZUMAB CHEMO 150 MG IV SOLR
6.0000 mg/kg | Freq: Once | INTRAVENOUS | Status: AC
  Administered 2018-03-01: 357 mg via INTRAVENOUS
  Filled 2018-03-01: qty 17

## 2018-03-01 MED ORDER — ALTEPLASE 2 MG IJ SOLR
2.0000 mg | Freq: Once | INTRAMUSCULAR | Status: DC | PRN
  Filled 2018-03-01: qty 2

## 2018-03-01 MED ORDER — ACETAMINOPHEN 325 MG PO TABS
650.0000 mg | ORAL_TABLET | Freq: Once | ORAL | Status: AC
  Administered 2018-03-01: 650 mg via ORAL

## 2018-03-01 MED ORDER — CYANOCOBALAMIN 1000 MCG/ML IJ SOLN
INTRAMUSCULAR | Status: AC
  Filled 2018-03-01: qty 1

## 2018-03-01 MED ORDER — FULVESTRANT 250 MG/5ML IM SOLN
INTRAMUSCULAR | Status: AC
  Filled 2018-03-01: qty 10

## 2018-03-01 MED ORDER — CYANOCOBALAMIN 1000 MCG/ML IJ SOLN
1000.0000 ug | Freq: Once | INTRAMUSCULAR | Status: AC
  Administered 2018-03-01: 1000 ug via INTRAMUSCULAR

## 2018-03-01 MED ORDER — SODIUM CHLORIDE 0.9% FLUSH
10.0000 mL | INTRAVENOUS | Status: DC | PRN
  Administered 2018-03-01: 10 mL
  Filled 2018-03-01: qty 10

## 2018-03-01 MED ORDER — SODIUM CHLORIDE 0.9 % IV SOLN
420.0000 mg | Freq: Once | INTRAVENOUS | Status: AC
  Administered 2018-03-01: 420 mg via INTRAVENOUS
  Filled 2018-03-01: qty 14

## 2018-03-01 MED ORDER — SODIUM CHLORIDE 0.9 % IV SOLN
Freq: Once | INTRAVENOUS | Status: AC
  Administered 2018-03-01: 11:00:00 via INTRAVENOUS
  Filled 2018-03-01: qty 250

## 2018-03-01 NOTE — Patient Instructions (Signed)
[facility] Discharge Instructions for Patients Receiving Chemotherapy  Today you received the following chemotherapy agents:  Herceptin, & Perjeta  To help prevent nausea and vomiting after your treatment, we encourage you to take your nausea medication as needed.   If you develop nausea and vomiting that is not controlled by your nausea medication, call the clinic.   BELOW ARE SYMPTOMS THAT SHOULD BE REPORTED IMMEDIATELY:  *FEVER GREATER THAN 100.5 F  *CHILLS WITH OR WITHOUT FEVER  NAUSEA AND VOMITING THAT IS NOT CONTROLLED WITH YOUR NAUSEA MEDICATION  *UNUSUAL SHORTNESS OF BREATH  *UNUSUAL BRUISING OR BLEEDING  TENDERNESS IN MOUTH AND THROAT WITH OR WITHOUT PRESENCE OF ULCERS  *URINARY PROBLEMS  *BOWEL PROBLEMS  UNUSUAL RASH Items with * indicate a potential emergency and should be followed up as soon as possible.  Feel free to call the clinic should you have any questions or concerns. The clinic phone number is [phone].  Please show the [facility] at check-in to the Emergency Department and triage nurse.

## 2018-03-01 NOTE — Progress Notes (Signed)
Patient Care Team: [name], MD as PCP - General (Family Medicine)  DIAGNOSIS:  Encounter Diagnosis  Name Primary?  . Primary cancer of upper outer quadrant of right female breast ([facility])     SUMMARY OF ONCOLOGIC HISTORY:   Primary cancer of upper outer quadrant of right female breast ([facility])   03/08/2012 Initial Diagnosis    Right breast invasive ductal carcinoma ER positive PR negative HER-2 positive Ki-67 44%; another breast mass biopsied in the anterior part of the breast which was also positive for malignancy that was HER-2 negative    05/10/2012 Surgery    Right mastectomy and axillary lymph node dissection: Multifocal disease 5 cm, 1.7 cm, 1.6 cm, ER positive PR negative HER-2 positive Ki-67 44%, 3/7 lymph nodes positive    06/14/2012 - 06/12/2013 Chemotherapy    Adjuvant chemotherapy with [place] 6 followed by Herceptin maintenance    10/25/2012 - 12/11/2012 Radiation Therapy    Adjuvant radiation therapy    02/07/2013 -  Anti-estrogen oral therapy    Letrozole 2.5 mg daily    02/14/2015 Imaging    MRI spine: Large destructive T5 lesion with severe pathologic compression fracture and extensive epidural tumor, severe spinal stenosis with moderate cord compression, tumor involvement of T4    03/18/2015 PET scan    Residual enhancing soft tissue adjacent to the spinal cord. No evidence of metastatic disease. Nonspecific uptake the left nipple    03/27/2015 - 03/30/2015 Hospital Admission    T4-6 decompression for spinal cord compression (lower extremity paralysis)    04/10/2015 -  Chemotherapy    Palliative treatment with Herceptin every 3 weeks with letrozole 2.5 mg daily    04/13/2015 - 05/19/2015 Radiation Therapy    Palliative radiation treatment to the spine    09/01/2015 Imaging    CT chest abdomen pelvis: Pathologic fracture with posterior fusion at T5 no other evidence of metastatic disease in the chest abdomen pelvis    12/23/2015 Imaging    CT CAP: new nonspecific 0.6  cm lymph node was stated mediastinum needs follow-up CT, innumerable tiny groundglass pulmonary nodules throughout both lungs unchanged, patchy consolidation from radiation, T5 fracture, no mets    04/15/2016 PET scan    Rt para-spinal mass mildy hypermetabolic SUV 5.9, lytic cortical lesion 4.8 cm lesion left prox femur suv 8.8 favor osseus met disease    04/28/2016 - 05/12/2016 Radiation Therapy    Palliative XRT Left femur    05/24/2016 Procedure    Soft tissue mass biopsy back: Metastatic breast cancer ER 80%, PR 0%, Ki-67 50%, HER-2 positive ratio 2.25    06/09/2016 - 03/29/2017 Anti-estrogen oral therapy    Faslodex with Herceptin and Perjeta every 4 weeks    06/13/2016 - 06/15/2016 Hospital Admission    uncomplicated revision of previous thoracic instrumentation.    10/10/2016 PET scan    Interval development of new hypermetabolic 2.5 x 1.1 cm lesion posterior right eighth rib consistent with metastatic disease, stable right paraspinal lesion at T8, clustered soft tissue nodules in the posterior midline back near cervicothoracic junction smaller than before     11/03/2016 - 11/07/2016 Radiation Therapy    Palliative radiation to right eighth rib    11/04/2016 Imaging    Solitary contrast-enhancing lesion in the right cerebellum 6 x 2 mm concerning for metastatic lesion    11/24/2016 - 11/24/2016 Radiation Therapy    SRS to the brain lesion    04/18/2017 PET scan    New ill-defined rounded airspace opacity  in posterior right lower lobe. Differential diagnosis includes malignancy and infectious/inflammatory process.Increased hypermetabolic lytic lesion in T8 vertebral body, consistent with bone metastasis.  Increased hypermetabolic posterior upper chest wall subcutaneous nodules, highly suspicious metastatic disease. Further improvement in right posterior eighth rib metastasis.    05/04/2017 - 07/27/2017 Chemotherapy    Kadcyla every 3 weeks palliative chemotherapy stopped for progression     07/26/2017 Imaging    Interval increase in size of the multiple enhancing masses posteriorly overlying the spine, interval development of left axillary and mediastinal adenopathy, increase in the size of lytic lesions in T8-T9 and left ninth rib, increased groundglass opacities right lower lobe lung    07/27/2017 -  Chemotherapy    Palbociclib + Faslodex + trastuzumab + Pertuzumab       CHIEF COMPLIANT: Follow-up on palbociclib with Faslodex trastuzumab and Pertuzumab  INTERVAL HISTORY: Heather [name] is a 72 year old with above-mentioned history metastatic breast cancer currently on treatment with palbociclib with Faslodex trastuzumab and Pertuzumab.  She has noticed a new nodule on her mid upper back below the area of her previous subcutaneous metastases.  She is also complaining of cramping pains in the right chest wall.  She does have mild fatigue currently but it has improved markedly because of B12 injections.  Denies any nausea or vomiting.  REVIEW OF SYSTEMS:   Constitutional: Denies fevers, chills or abnormal weight loss Eyes: Denies blurriness of vision Ears, nose, mouth, throat, and face: Denies mucositis or sore throat Respiratory: Denies cough, dyspnea or wheezes Cardiovascular: Denies palpitation, chest discomfort Gastrointestinal:  Denies nausea, heartburn or change in bowel habits Skin: New subcutaneous nodule in the upper back Lymphatics: Denies new lymphadenopathy or easy bruising Neurological:Denies numbness, tingling or new weaknesses Behavioral/Psych: Mood is stable, no new changes  Extremities: No lower extremity edema  All other systems were reviewed with the patient and are negative.  I have reviewed the past medical history, past surgical history, social history and family history with the patient and they are unchanged from previous note.  ALLERGIES:  is allergic to other; acyclovir and related; zanaflex [tizanidine hcl]; codeine; keflex [cephalexin]; naprosyn  [naproxen]; oxycodone; tramadol; and tussin [guaifenesin].  MEDICATIONS:  Current Outpatient Medications  Medication Sig Dispense Refill  . ALPRAZolam (XANAX) 0.25 MG tablet Take 1 tablet by mouth as needed.    Marland Kitchen b complex vitamins tablet Take 1 tablet by mouth daily.    . Calcium Carbonate Antacid (TUMS PO) Take 2 tablets by mouth 2 (two) times daily as needed (acid reflux).    . Cholecalciferol (VITAMIN D) 2000 UNITS tablet Take 2,000 Units by mouth daily.    . Coenzyme Q10 (COQ10) 100 MG CAPS Take 100 mg by mouth as needed. Days that she takes pravastatin MWF.    Marland Kitchen fluticasone (FLONASE) 50 MCG/ACT nasal spray Place 1 spray into both nostrils daily as needed.    . gabapentin (NEURONTIN) 600 MG tablet Take 1 tablet (600 mg total) by mouth as directed. Take 1 tablet in the AM and afternoon and 1 1/2 tabs at bedtime (Patient taking differently: Take 600 mg by mouth 3 (three) times daily. Take 1 tablet in the AM and afternoon and 1 1/2 tabs at bedtime) 315 tablet 2  . HYDROcodone-acetaminophen (NORCO/VICODIN) 5-325 MG tablet     . lidocaine-prilocaine (EMLA) cream Apply to affected area once (Patient taking differently: Apply 1 application topically every 30 (thirty) days. Apply to port prior to infusions) 30 g 3  . OVER THE COUNTER MEDICATION  Place 1 drop into both eyes 2 (two) times daily as needed (dry eyes). Over the counter lubricating eye drops    . palbociclib (IBRANCE) 100 MG capsule Take 1 capsule (100 mg total) by mouth daily with breakfast. Take for 21 days on, 14 days off, repeat every 35 days. Take whole with food. 21 capsule 6  . polyethylene glycol (MIRALAX / GLYCOLAX) packet Take 17 g by mouth daily as needed.    . Probiotic Product (PROBIOTIC DAILY PO) Take 1 capsule by mouth daily.     . ranitidine (ZANTAC) 300 MG tablet Take 300 mg by mouth at bedtime. Reported on 06/25/2015    . spironolactone (ALDACTONE) 25 MG tablet Take 25 mg by mouth daily.     No current  facility-administered medications for this visit.    Facility-Administered Medications Ordered in Other Visits  Medication Dose Route Frequency Provider Last Rate Last Dose  . sodium chloride 0.9 % injection 10 mL  10 mL Intravenous PRN [name], MD   10 mL at 04/26/17 0086    PHYSICAL EXAMINATION: ECOG PERFORMANCE STATUS: 1 - Symptomatic but completely ambulatory  Vitals:   03/01/18 0947  BP: (!) 162/68  Pulse: 71  Resp: 17  Temp: (!) 97.5 F (36.4 C)  SpO2: 100%   Filed Weights   03/01/18 0947  Weight: 137 lb 4.8 oz (62.3 kg)    GENERAL:alert, no distress and comfortable SKIN: New subcutaneous nodule in the upper back measuring 1.5 cm EYES: normal, Conjunctiva are pink and non-injected, sclera clear OROPHARYNX:no exudate, no erythema and lips, buccal mucosa, and tongue normal  NECK: supple, thyroid normal size, non-tender, without nodularity LYMPH:  no palpable lymphadenopathy in the cervical, axillary or inguinal LUNGS: clear to auscultation and percussion with normal breathing effort HEART: regular rate & rhythm and no murmurs and no lower extremity edema ABDOMEN:abdomen soft, non-tender and normal bowel sounds MUSCULOSKELETAL:no cyanosis of digits and no clubbing  NEURO: alert & oriented x 3 with fluent speech, no focal motor/sensory deficits EXTREMITIES: No lower extremity edema   LABORATORY DATA:  I have reviewed the data as listed CMP Latest Ref Rng & Units 01/24/2018 11/16/2017 10/12/2017  Glucose 70 - 99 mg/dL 100(H) 136(H) 86  BUN 8 - 23 mg/dL _0 Creatinine 0.44 - 1.00 mg/dL 0.86 0.87 0.85  Sodium 135 - 145 mmol/L 139 137 137  Potassium 3.5 - 5.1 mmol/L 3.9 3.8 4.0  Chloride 98 - 111 mmol/L 102 100 101  CO2 22 - 32 mmol/L _1 Calcium 8.9 - 10.3 mg/dL 10.1 10.2 9.9  Total Protein 6.5 - 8.1 g/dL 7.4 7.4 7.5  Total Bilirubin 0.3 - 1.2 mg/dL 0.6 0.6 0.4  Alkaline Phos 38 - 126 U/L 73 73 81  AST 15 - 41 U/L 29 33 41(H)  ALT 0 - 44 U/L _2 Lab Results  Component Value Date   WBC 4.1 03/01/2018   HGB 12.1 03/01/2018   HCT 36.1 03/01/2018   MCV 98.9 03/01/2018   PLT 193 03/01/2018   NEUTROABS 2.8 03/01/2018    ASSESSMENT & PLAN:  Primary cancer of upper outer quadrant of right female breast ([facility]) Right breast invasive ductal carcinoma ER positive PR negative HER-2 positive Ki-67 44% multifocal disease 3/7 lymph nodes positive T2 N1 M0 stage IIB status post adjuvant chemotherapy with TCH followed by Herceptin maintenance, adjuvant radiation therapy and Letrozole. MRI 02/14/2015: T5 large destructive lesion with pathologic compression fracture  and extensive epidural tumor, involvement of T4, excision metastatic carcinoma ER 60%, PR 0%, HER-2 positive ratio 2.71 average copy #7.45 03/27/2015: T4-6 decompression surgery for spinal cord compression PET-CT 12/10/17Rt para-spinal mass mildy hypermetabolic SUV 5.9, lytic cortical lesion 4.8 cm lesion left prox femur suv 8.8 favor osseus met disease Echo 04/12/2017 shows well preserved ejection fraction of 55-60%   Treatment summary: 1. Resumed anastrozole 05/21/2015, but it has caused significant hair loss so we switched her to exemestane 25 mg once daily 07/23/2015 2. Herceptin every 3 week started 04/10/2015 3. Bone metastases: Zometa every 3 months-next duelate May/early June 4. Radiation therapy to the spine (Dr. [name]) 04/13/2015 to 05/19/2015 5. Radiation therapy to left femur 04/28/2016 to 05/12/2016  6. SRS to the brain lesion 11/24/2016 7.Kadcyla started 05/05/2017 discontinued 07/27/2017 -------------------------------------------------------------------------------------------------------------------------- Current treatment:Palbociclib +Faslodex +trastuzumab+ Pertuzumabstarted 08/10/2017  Toxicities of palbociclib: 1.Neutropenia: Decrease dosage to 100 mg palbociclib,3 weeks on 2 weeks offtoday's ANC is1.4 2.fatigue: Much improved since the  dose was reduced  CT CAP 12/13/2017: Mediastinal adenopathy has regressed, left axillary adenopathy resolved, subcutaneous tumor regressed, no new or progressive bone metastases New subcutaneous nodule in the upper back is concerning for another subcutaneous metastases. We plan to do a PET CT scan in November and follow-up after that. If there is no other progression of disease then we will plan to continue with the same treatment.  And we will consider removal of the subcutaneous nodule. If there is marked progression of disease then we will have to consider switching her to a different treatment regimen. Continue with Faslodex and Trastuzumab, Pertuzumabevery 4 weeks.    No orders of the defined types were placed in this encounter.  The patient has a good understanding of the overall plan. she agrees with it. she will call with any problems that may develop before the next visit here.   Harriette Ohara, MD 03/01/18

## 2018-03-01 NOTE — Assessment & Plan Note (Signed)
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 MRI 02/14/2015: T5 large destructive lesion with pathologic compression fracture and extensive epidural tumor, involvement of T4, excision metastatic carcinoma ER 60%, PR 0%, HER-2 positive ratio 2.71 average copy #7.45 03/27/2015: T4-6 decompression surgery for spinal cord compression PET-CT 12/10/17Rt para-spinal mass mildy hypermetabolic SUV 5.9, lytic cortical lesion 4.8 cm lesion left prox femur suv 8.8 favor osseus met disease Echo 04/12/2017 shows well preserved ejection fraction of 55-60%   Treatment summary: 1. Resumed anastrozole 05/21/2015, but it has caused significant hair loss so we switched her to exemestane 25 mg once daily 07/23/2015 2. Herceptin every 3 week started 04/10/2015 3. Bone metastases: Zometa every 3 months-next duelate May/early June 4. Radiation therapy to the spine (Dr. Manning) 04/13/2015 to 05/19/2015 5. Radiation therapy to left femur 04/28/2016 to 05/12/2016  6. SRS to the brain lesion 11/24/2016 7.Kadcyla started 05/05/2017 discontinued 07/27/2017 -------------------------------------------------------------------------------------------------------------------------- Current treatment:Palbociclib +Faslodex +trastuzumab+ Pertuzumabstarted 08/10/2017  Toxicities of palbociclib: 1.Neutropenia: Decrease dosage to 100 mg palbociclib, 3 weeks on 2 weeks off today's ANC is1.4 2.fatigue: Much improved since the dose was reduced  CT CAP 12/13/2017: Mediastinal adenopathy has regressed, left axillary adenopathy resolved, subcutaneous tumor regressed, no new or progressive bone metastases  Continue with Faslodex and Trastuzumab, Pertuzumabevery 4 weeks. 

## 2018-03-02 ENCOUNTER — Telehealth: Payer: Self-pay | Admitting: Hematology and Oncology

## 2018-03-02 NOTE — Telephone Encounter (Signed)
Spoke to pt regarding appts per 10/24 los .

## 2018-03-07 MED FILL — IBRANCE 100 MG CAPSULE: 100 | 28 days supply | Qty: 21 | Fill #4

## 2018-03-30 ENCOUNTER — Other Ambulatory Visit: Payer: Self-pay | Admitting: *Deleted

## 2018-04-03 ENCOUNTER — Encounter (HOSPITAL_COMMUNITY)
Admission: RE | Admit: 2018-04-03 | Discharge: 2018-04-03 | Disposition: A | Discharge status: Home / Self Care | Payer: Medicare Other | Source: Ambulatory Visit | Attending: Hematology and Oncology | Admitting: Hematology and Oncology

## 2018-04-03 ENCOUNTER — Other Ambulatory Visit: Payer: Self-pay | Admitting: Radiation Therapy

## 2018-04-03 ENCOUNTER — Telehealth: Payer: Self-pay | Admitting: Hematology and Oncology

## 2018-04-03 DIAGNOSIS — C50411 Malignant neoplasm of upper-outer quadrant of right female breast: Secondary | ICD-10-CM | POA: Diagnosis not present

## 2018-04-03 DIAGNOSIS — C7931 Secondary malignant neoplasm of brain: Secondary | ICD-10-CM

## 2018-04-03 DIAGNOSIS — C7949 Secondary malignant neoplasm of other parts of nervous system: Secondary | ICD-10-CM

## 2018-04-03 DIAGNOSIS — C50919 Malignant neoplasm of unspecified site of unspecified female breast: Secondary | ICD-10-CM | POA: Diagnosis not present

## 2018-04-03 DIAGNOSIS — C50911 Malignant neoplasm of unspecified site of right female breast: Secondary | ICD-10-CM | POA: Diagnosis not present

## 2018-04-03 DIAGNOSIS — C7951 Secondary malignant neoplasm of bone: Secondary | ICD-10-CM | POA: Insufficient documentation

## 2018-04-03 LAB — GLUCOSE, CAPILLARY: Glucose-Capillary: 97 mg/dL (ref 70–99)

## 2018-04-03 MED ORDER — FLUDEOXYGLUCOSE F - 18 (FDG) INJECTION
6.3300 | Freq: Once | INTRAVENOUS | Status: AC | PRN
  Administered 2018-04-03: 6.33 via INTRAVENOUS

## 2018-04-03 NOTE — Telephone Encounter (Signed)
I called and left a message for the patient that the PET CT scan showed progression of disease not only in the bones but also in the subcutaneous nodules. We will try to send for [facility] molecular testing on the prior resected specimen before deciding on the proper treatment plan. Given the fact that she has progressed on Ibrance and Herceptin and Perjeta and Faslodex, the next logical treatment options include exemestane and Everolimus versus PIK 3 CA inhibitors if PIK 3 CA is mutated. We will see the patient back next week to discuss treatment options.

## 2018-04-03 NOTE — Progress Notes (Signed)
Per  Dr.Gudena, Sent request for molecular testing to [facility].

## 2018-04-04 ENCOUNTER — Inpatient Hospital Stay: Payer: Medicare Other

## 2018-04-04 ENCOUNTER — Encounter: Payer: Self-pay | Admitting: Pharmacist

## 2018-04-04 ENCOUNTER — Inpatient Hospital Stay: Payer: Medicare Other | Attending: Hematology and Oncology

## 2018-04-04 ENCOUNTER — Encounter: Payer: Self-pay | Admitting: Adult Health

## 2018-04-04 ENCOUNTER — Telehealth: Payer: Self-pay | Admitting: Adult Health

## 2018-04-04 ENCOUNTER — Other Ambulatory Visit: Payer: Medicare Other

## 2018-04-04 ENCOUNTER — Inpatient Hospital Stay (HOSPITAL_BASED_OUTPATIENT_CLINIC_OR_DEPARTMENT_OTHER): Payer: Medicare Other | Admitting: Adult Health

## 2018-04-04 ENCOUNTER — Telehealth: Payer: Self-pay | Admitting: Hematology and Oncology

## 2018-04-04 VITALS — BP 137/58 | HR 73 | Temp 97.8°F | Resp 18 | Ht 64.0 in | Wt 134.0 lb

## 2018-04-04 DIAGNOSIS — Z170 Estrogen receptor positive status [ER+]: Secondary | ICD-10-CM | POA: Insufficient documentation

## 2018-04-04 DIAGNOSIS — Z9221 Personal history of antineoplastic chemotherapy: Secondary | ICD-10-CM

## 2018-04-04 DIAGNOSIS — C50911 Malignant neoplasm of unspecified site of right female breast: Secondary | ICD-10-CM

## 2018-04-04 DIAGNOSIS — C7951 Secondary malignant neoplasm of bone: Secondary | ICD-10-CM | POA: Insufficient documentation

## 2018-04-04 DIAGNOSIS — Z79899 Other long term (current) drug therapy: Secondary | ICD-10-CM

## 2018-04-04 DIAGNOSIS — C7931 Secondary malignant neoplasm of brain: Secondary | ICD-10-CM | POA: Insufficient documentation

## 2018-04-04 DIAGNOSIS — Z79811 Long term (current) use of aromatase inhibitors: Secondary | ICD-10-CM

## 2018-04-04 DIAGNOSIS — Z923 Personal history of irradiation: Secondary | ICD-10-CM

## 2018-04-04 DIAGNOSIS — C7989 Secondary malignant neoplasm of other specified sites: Secondary | ICD-10-CM | POA: Diagnosis not present

## 2018-04-04 DIAGNOSIS — C50411 Malignant neoplasm of upper-outer quadrant of right female breast: Secondary | ICD-10-CM

## 2018-04-04 DIAGNOSIS — I10 Essential (primary) hypertension: Secondary | ICD-10-CM

## 2018-04-04 DIAGNOSIS — Z9011 Acquired absence of right breast and nipple: Secondary | ICD-10-CM | POA: Insufficient documentation

## 2018-04-04 LAB — CMP (CANCER CENTER ONLY)
ALT: 16 U/L (ref 0–44)
AST: 31 U/L (ref 15–41)
Albumin: 3.9 g/dL (ref 3.5–5.0)
Alkaline Phosphatase: 66 U/L (ref 38–126)
Anion gap: 10 (ref 5–15)
BUN: 14 mg/dL (ref 8–23)
CO2: 26 mmol/L (ref 22–32)
Calcium: 10.2 mg/dL (ref 8.9–10.3)
Chloride: 103 mmol/L (ref 98–111)
Creatinine: 0.83 mg/dL (ref 0.44–1.00)
GFR, Est AFR Am: >60 mL/min (ref >60)
GFR, Estimated: >60 mL/min (ref >60)
Glucose, Bld: 121 mg/dL — ABNORMAL HIGH (ref 70–99)
Potassium: 3.8 mmol/L (ref 3.5–5.1)
Sodium: 139 mmol/L (ref 135–145)
Total Bilirubin: 0.5 mg/dL (ref 0.3–1.2)
Total Protein: 7.2 g/dL (ref 6.5–8.1)

## 2018-04-04 LAB — CBC WITH DIFFERENTIAL (CANCER CENTER ONLY)
Abs Immature Granulocytes: 0.01 10*3/uL (ref 0.00–0.07)
Basophils Absolute: 0.1 10*3/uL (ref 0.0–0.1)
Basophils Relative: 3 %
Eosinophils Absolute: 0 10*3/uL (ref 0.0–0.5)
Eosinophils Relative: 1 %
HCT: 33.7 % — ABNORMAL LOW (ref 36.0–46.0)
Hemoglobin: 11.4 g/dL — ABNORMAL LOW (ref 12.0–15.0)
Immature Granulocytes: 0 %
Lymphocytes Relative: 15 %
Lymphs Abs: 0.4 10*3/uL — ABNORMAL LOW (ref 0.7–4.0)
MCH: 32.9 pg (ref 26.0–34.0)
MCHC: 33.8 g/dL (ref 30.0–36.0)
MCV: 97.1 fL (ref 80.0–100.0)
Monocytes Absolute: 0.4 10*3/uL (ref 0.1–1.0)
Monocytes Relative: 14 %
Neutro Abs: 1.8 10*3/uL (ref 1.7–7.7)
Neutrophils Relative %: 67 %
Platelet Count: 184 10*3/uL (ref 150–400)
RBC: 3.47 MIL/uL — ABNORMAL LOW (ref 3.87–5.11)
RDW: 16 % — ABNORMAL HIGH (ref 11.5–15.5)
WBC Count: 2.7 10*3/uL — ABNORMAL LOW (ref 4.0–10.5)
nRBC: 0 % (ref 0.0–0.2)

## 2018-04-04 MED ORDER — SODIUM CHLORIDE 0.9% FLUSH
10.0000 mL | Freq: Once | INTRAVENOUS | Status: AC
  Administered 2018-04-04: 10 mL
  Filled 2018-04-04: qty 10

## 2018-04-04 MED ORDER — HYDROCODONE-ACETAMINOPHEN 5-325 MG PO TABS: 1.0000 | ORAL_TABLET | Freq: Four times a day (QID) | ORAL | 0 refills | Status: DC | PRN

## 2018-04-04 MED ORDER — TOBRAMYCIN-DEXAMETHASONE 0.3-0.1 % OP SUSP: 1.0000 [drp] | Freq: Four times a day (QID) | OPHTHALMIC | 0 refills | Status: DC

## 2018-04-04 MED ORDER — SODIUM CHLORIDE 0.9 % IJ SOLN: 10.0000 mL | INTRAMUSCULAR | Status: DC | PRN

## 2018-04-04 NOTE — Telephone Encounter (Signed)
Gave patient avs and calendar.  Unable to move 12/6 appt due to patient's schedule.

## 2018-04-04 NOTE — Progress Notes (Addendum)
Oral Oncology Pharmacist Encounter  Received notification that Leslee Home will be discontinued with progression of disease.  [facility] has been notified to stop any prescription processing.  Leslee Home has been removed from patient's medication list.  No further needs from [facility] identified at this time. Oral Oncology Clinic will sign off. Please let us know if we can be of assistance in the future.  Johny Drilling, PharmD, BCPS, BCOP  04/04/2018 12:40 PM Oral Oncology Clinic [phone]

## 2018-04-04 NOTE — Patient Instructions (Signed)
Exemestane tablets What is this medicine? EXEMESTANE (ex e MES tane) blocks the production of the hormone estrogen. Some types of breast cancer depend on estrogen to grow, and this medicine can stop tumor growth by blocking estrogen production. This medicine is for the treatment of breast cancer in postmenopausal women only. This medicine may be used for other purposes; ask your health care provider or pharmacist if you have questions. COMMON BRAND NAME(S): Aromasin What should I tell my health care provider before I take this medicine? They need to know if you have any of these conditions: -an unusual or allergic reaction to exemestane, other medicines, foods, dyes, or preservatives -pregnant or trying to get pregnant -breast-feeding How should I use this medicine? Take this medicine by mouth with a glass of water. Follow the directions on the prescription label. Take your doses at regular intervals after a meal. Do not take your medicine more often than directed. Do not stop taking except on the advice of your doctor or health care professional. Contact your pediatrician regarding the use of this medicine in children. Special care may be needed. Overdosage: If you think you have taken too much of this medicine contact a poison control center or emergency room at once. NOTE: This medicine is only for you. Do not share this medicine with others. What if I miss a dose? If you miss a dose, take the next dose as usual. Do not try to make up the missed dose. Do not take double or extra doses. What may interact with this medicine? Do not take this medicine with any of the following medications: -female hormones, like estrogens and birth control pills This medicine may also interact with the following medications: -androstenedione -phenytoin -rifabutin, rifampin, or rifapentine -St. John's Wort This list may not describe all possible interactions. Give your health care provider a list of all the  medicines, herbs, non-prescription drugs, or dietary supplements you use. Also tell them if you smoke, drink alcohol, or use illegal drugs. Some items may interact with your medicine. What should I watch for while using this medicine? Visit your doctor or health care professional for regular checks on your progress. If you experience hot flashes or sweating while taking this medicine, avoid alcohol, smoking and drinks with caffeine. This may help to decrease these side effects. Do not become pregnant while taking this medicine or for 1 month after stopping it. Women should inform their doctor if they wish to become pregnant or think they might be pregnant. Women of child-bearing potential will need to have a negative pregnancy test before starting this medicine. There is a potential for serious side effects to an unborn child. Do not breast-feed an infant while taking this medicine or for 1 month after stopping it. Talk to your health care professional or pharmacist for more information. What side effects may I notice from receiving this medicine? Side effects that you should report to your doctor or health care professional as soon as possible: -any new or unusual symptoms -changes in vision -fever -leg or arm swelling -pain in bones, joints, or muscles -pain in hips, back, ribs, arms, shoulders, or legs Side effects that usually do not require medical attention (report to your doctor or health care professional if they continue or are bothersome): -difficulty sleeping -headache -hot flashes -sweating -unusually weak or tired This list may not describe all possible side effects. Call your doctor for medical advice about side effects. You may report side effects to FDA at 1-800-FDA-1088.  Where should I keep my medicine? Keep out of the reach of children. Store at room temperature between 15 and 30 degrees C (59 and 86 degrees F). Throw away any unused medicine after the expiration date. NOTE:  This sheet is a summary. It may not cover all possible information. If you have questions about this medicine, talk to your doctor, pharmacist, or health care provider.  2018 [employer]/Gold Standard (2014-12-05 12:11:38) Everolimus tablets What is this medicine? EVEROLIMUS (eve ROE li mus) decreases the activity of your immune system. Afinitor is used to treat certain types of cancer. Zortress is used for kidney and liver transplant rejection prophylaxis. This medicine may be used for other purposes; ask your health care provider or pharmacist if you have questions. COMMON BRAND NAME(S): Afinitor, Zortress What should I tell my health care provider before I take this medicine? They need to know if you have any of these conditions: -diabetes -heart disease -high cholesterol -immune system problems -infection (especially a virus infection such as chickenpox, cold sores, or herpes) -kidney disease -liver disease -low blood counts, like low white cell, platelet, or red cell counts -rare hereditary problems of galactose intolerance, the Lapp lactase deficiency, or glucose-galactose malabsorption -an unusual or allergic reaction to everolimus, other medicines, foods, dyes, or preservatives -pregnant or trying to get pregnant -breast-feeding How should I use this medicine? Take this medicine by mouth with a full glass of water. Follow the directions on the prescription label. You can take this medicine with or without food, but always take Zortress the same way. Do not cut, crush, or chew this medicine. Do not take with grapefruit juice. Take your medicine at regular intervals. Do not take it more often than directed. Do not stop taking except on your doctor's advice. A special MedGuide will be given to you by the pharmacist with each prescription and refill. Be sure to read this information carefully each time. Talk to your pediatrician regarding the use of this medicine in children. Special care  may be needed. Overdosage: If you think you have taken too much of this medicine contact a poison control center or emergency room at once. NOTE: This medicine is only for you. Do not share this medicine with others. What if I miss a dose? If you miss a dose, take it as soon as you can. If it is almost time for your next dose, take only that dose. Do not take double or extra doses. What may interact with this medicine? This medicine may interact with the following medications: -antiviral medicines for HIV or AIDS -aprepitant -carbamazepine -certain medicines for cholesterol like simvastatin -clarithromycin -cyclosporine -dexamethasone -diltiazem -erythromycin -fluconazole -grapefruit juice -itraconazole -ketoconazole -live vaccines -nefazodone -nicardipine -phenobarbital -phenytoin -rifabutin -rifampin -telithromycin -verapamil -voriconazole This list may not describe all possible interactions. Give your health care provider a list of all the medicines, herbs, non-prescription drugs, or dietary supplements you use. Also tell them if you smoke, drink alcohol, or use illegal drugs. Some items may interact with your medicine. What should I watch for while using this medicine? This drug may make you feel generally unwell. This is not uncommon, as chemotherapy can affect healthy cells as well as cancer cells. Report any side effects. Continue your course of treatment even though you feel ill unless your doctor tells you to stop. Visit your doctor or health care professional for regular check-ups. You may need regular tests to monitor possible side effects of the drug. This medicine may affect blood sugar levels. If  you have diabetes, check with your doctor or health care professional before you change your diet or the dose of your diabetic medicine. Do not become pregnant while taking this medicine or for 8 weeks after stopping it. Women should inform their doctor if they wish to become  pregnant or think they might be pregnant. Men should not father a child while taking this medicine and for 4 weeks after stopping it. There is a potential for serious side effects to an unborn child. Talk to your health care professional or pharmacist for more information. Do not breast-feed an infant while taking this medicine or for 2 weeks after stopping it. This medicine has caused ovarian failure in some women and reduced sperm counts in some men. This medicine may interfere with the ability to have a child. You should talk with your doctor or health care professional if you are concerned about your fertility. This medicine has caused reduced sperm counts in some men. This may interfere with the ability to father a child. You should talk to your doctor or health care professional if you are concerned about your fertility. Call your doctor or health care professional for advice if you get a fever, chills or sore throat, or other symptoms of a cold or flu. Do not treat yourself. This drug decreases your body's ability to fight infections. Try to avoid being around people who are sick. This medicine may increase your risk to bruise or bleed. Call your doctor or health care professional if you notice any unusual bleeding. If you have had a kidney transplant, immediately tell your doctor if your incision site is red, warm, or painful. Also, tell your doctor if your incision site opens up or swells or if contains blood, fluid, or pus. Keep out of the sun. If you cannot avoid being in the sun, wear protective clothing and use sunscreen. Do not use sun lamps or tanning beds/booths. What side effects may I notice from receiving this medicine? Side effects that you should report to your doctor or health care professional as soon as possible: -allergic reactions like skin rash, itching or hives, swelling of the face, lips, or tongue -breathing problems -chest pain -cough -dark urine -fever or chills, sore  throat -increased hunger or thirst -increased urination -nausea, vomiting -stomach pain -swelling of ankles, feet, hands -trouble passing urine or change in the amount of urine -unusual bleeding or bruising -unusually weak or tired Side effects that usually do not require medical attention (report to your doctor or health care professional if they continue or are bothersome): -constipation -diarrhea -dizziness -dry mouth or mouth sores -headache -nausea, vomiting This list may not describe all possible side effects. Call your doctor for medical advice about side effects. You may report side effects to FDA at 1-800-FDA-1088. Where should I keep my medicine? Keep out of the reach of children. Store at room temperature between 15 and 30 degrees C (59 and 86 degrees F). Throw away any unused medicine after the expiration date. NOTE: This sheet is a summary. It may not cover all possible information. If you have questions about this medicine, talk to your doctor, pharmacist, or health care provider.  2018 [employer]/Gold Standard (2016-02-11 06:31:11)

## 2018-04-04 NOTE — Progress Notes (Signed)
[facility] Follow up:    Heather Stains, MD [place] [id number]   DIAGNOSIS: Cancer Staging Primary cancer of upper outer quadrant of right female breast [facility]) Staging form: Breast, AJCC 7th Edition - Clinical: Stage IIB (T2, N1, cM0) - Unsigned Staging comments: Staged at breast conference 11.6.13  - Pathologic: No stage assigned - Unsigned   SUMMARY OF ONCOLOGIC HISTORY:   Primary cancer of upper outer quadrant of right female breast ([place])   03/08/2012 Initial Diagnosis    Right breast invasive ductal carcinoma ER positive PR negative HER-2 positive Ki-67 44%; another breast mass biopsied in the anterior part of the breast which was also positive for malignancy that was HER-2 negative    05/10/2012 Surgery    Right mastectomy and axillary lymph node dissection: Multifocal disease 5 cm, 1.7 cm, 1.6 cm, ER positive PR negative HER-2 positive Ki-67 44%, 3/7 lymph nodes positive    06/14/2012 - 06/12/2013 Chemotherapy    Adjuvant chemotherapy with [place] 6 followed by Herceptin maintenance    10/25/2012 - 12/11/2012 Radiation Therapy    Adjuvant radiation therapy    02/07/2013 -  Anti-estrogen oral therapy    Letrozole 2.5 mg daily    02/14/2015 Imaging    MRI spine: Large destructive T5 lesion with severe pathologic compression fracture and extensive epidural tumor, severe spinal stenosis with moderate cord compression, tumor involvement of T4    03/18/2015 PET scan    Residual enhancing soft tissue adjacent to the spinal cord. No evidence of metastatic disease. Nonspecific uptake the left nipple    03/27/2015 - 03/30/2015 Hospital Admission    T4-6 decompression for spinal cord compression (lower extremity paralysis)    04/10/2015 -  Chemotherapy    Palliative treatment with Herceptin every 3 weeks with letrozole 2.5 mg daily    04/13/2015 - 05/19/2015 Radiation Therapy    Palliative radiation treatment to the spine    09/01/2015  Imaging    CT chest abdomen pelvis: Pathologic fracture with posterior fusion at T5 no other evidence of metastatic disease in the chest abdomen pelvis    12/23/2015 Imaging    CT CAP: new nonspecific 0.6 cm lymph node was stated mediastinum needs follow-up CT, innumerable tiny groundglass pulmonary nodules throughout both lungs unchanged, patchy consolidation from radiation, T5 fracture, no mets    04/15/2016 PET scan    Rt para-spinal mass mildy hypermetabolic SUV 5.9, lytic cortical lesion 4.8 cm lesion left prox femur suv 8.8 favor osseus met disease    04/28/2016 - 05/12/2016 Radiation Therapy    Palliative XRT Left femur    05/24/2016 Procedure    Soft tissue mass biopsy back: Metastatic breast cancer ER 80%, PR 0%, Ki-67 50%, HER-2 positive ratio 2.25    06/09/2016 - 03/29/2017 Anti-estrogen oral therapy    Faslodex with Herceptin and Perjeta every 4 weeks    06/13/2016 - 06/15/2016 Hospital Admission    uncomplicated revision of previous thoracic instrumentation.    10/10/2016 PET scan    Interval development of new hypermetabolic 2.5 x 1.1 cm lesion posterior right eighth rib consistent with metastatic disease, stable right paraspinal lesion at T8, clustered soft tissue nodules in the posterior midline back near cervicothoracic junction smaller than before     11/03/2016 - 11/07/2016 Radiation Therapy    Palliative radiation to right eighth rib    11/04/2016 Imaging    Solitary contrast-enhancing lesion in the right cerebellum 6 x 2 mm concerning for  metastatic lesion    11/24/2016 - 11/24/2016 Radiation Therapy    SRS to the brain lesion    04/18/2017 PET scan    New ill-defined rounded airspace opacity in posterior right lower lobe. Differential diagnosis includes malignancy and infectious/inflammatory process.Increased hypermetabolic lytic lesion in T8 vertebral body, consistent with bone metastasis.  Increased hypermetabolic posterior upper chest wall subcutaneous nodules, highly  suspicious metastatic disease. Further improvement in right posterior eighth rib metastasis.    05/04/2017 - 07/27/2017 Chemotherapy    Kadcyla every 3 weeks palliative chemotherapy stopped for progression    07/26/2017 Imaging    Interval increase in size of the multiple enhancing masses posteriorly overlying the spine, interval development of left axillary and mediastinal adenopathy, increase in the size of lytic lesions in T8-T9 and left ninth rib, increased groundglass opacities right lower lobe lung    07/27/2017 -  Chemotherapy    Palbociclib + Faslodex + trastuzumab + Pertuzumab      04/03/2018 PET scan    PET CT scan showed progression of disease not only in the bones but also in the subcutaneous nodules.     CURRENT THERAPY: Palbociclib, Faslodex, Trastuzumab, Pertuzumab  INTERVAL HISTORY: Heather [name] 72 y.o. female returns for evaluation of her metastatic breast cancer.  She underwent PET scan yesterday that was compatible with disease progression.  She wants to know if she should proceed with her treatment today and resume Palbociclib that is due to start tomorrow.  She notes pain in the left and right hip, pain in her left foot, at the top, and increasing headaches in the right frontal region, and parietal region similar to where her skeletal lesions are.  She notes she has right jaw pain, and wonders if it could be tension.  She had dental xrays one year ago that were compatible with bone loss.  She notes a slight increase in anxiety.  She is having worsening muscle spasms.  She takes Gabapentin 686m TID.  This helps with nerve pain that she has.  She uses heat or ice for pain in her hips or any other paint hat may occur.  She does have Norco if she needs it, but this is 72years old.  She has used a muscle relaxer, however this caused a rash.  She cannot recall the name, she thinks it may be Tizanadine.   Patient Active Problem List   Diagnosis Date Noted  . Goals of care,  counseling/discussion 08/10/2017  . Brain metastasis ([facility] 11/18/2016  . Genetic testing 07/11/2016  . ALLERGY TO CHLORHEXADINE, USE BETADINE ONLY 06/23/2016  . Kyphosis of thoracic region 06/13/2016  . Plantar fasciitis, bilateral 04/20/2016  . Neuropathic pain 01/27/2016  . Anemia of chronic disease 05/21/2015  . Metastatic cancer to spine ([facility] 03/27/2015  . Thrombocytopenia ([facility] 03/12/2015  . Prerenal azotemia 03/12/2015  . Pain   . Recurrent UTI--due to Klebsiella   . Paraplegia at T4 level ([facility] 02/20/2015  . Neurogenic bowel 02/20/2015  . Neurogenic bladder 02/20/2015  . Postoperative anemia due to acute blood loss 02/17/2015  . Spinal cord compression due to malignant neoplasm metastatic to spine ([facility] 02/15/2015  . Epidural mass 02/15/2015  . Thoracic spine tumor   . Pathologic fracture of vertebra 02/14/2015  . Pathologic fracture of thoracic vertebrae 02/14/2015  . Breast cancer metastasized to bone, right ([facility] 02/14/2015  . Hyperlipidemia 02/14/2015  . H. pylori infection   . HTN (hypertension) 04/17/2012  . Primary cancer of upper outer quadrant of right  female breast ([place]) 03/08/2012    is allergic to other; acyclovir and related; chlorhexidine; zanaflex [tizanidine hcl]; codeine; keflex [cephalexin]; naprosyn [naproxen]; oxycodone; tramadol; and tussin [guaifenesin].  MEDICAL HISTORY: Past Medical History:  Diagnosis Date  . Anemia    hx of  . Anxiety    r/t updated surgery  . Breast cancer ([place])    right  . Cataract    immature-not sure of which eye  . Complication of anesthesia    pt states she is sensitive to meds  . Dizziness    has been going on for 65month;medical MD aware  . Genetic testing 07/11/2016   Test Results: Normal; No pathogenic mutations detected  Genes Analyzed: 43 genes on [employer]'s Common Cancers panel (APC, ATM, AXIN2, BARD1, BMPR1A, BRCA1, BRCA2, BRIP1, CDH1, CDKN2A, CHEK2, DICER1, EPCAM, GREM1, HOXB13, KIT, MEN1, MLH1, MSH2, MSH6,  MUTYH, NBN, NF1, PALB2, PDGFRA, PMS2, POLD1, POLE, PTEN, RAD50, RAD51C, RAD51D, SDHA, SDHB, SDHC, SDHD, SMAD4, SMARCA4, STK11, TP53, TSC1, TSC2, VHL).  .Marland KitchenGERD (gastroesophageal reflux disease)    Tums prn  . H. pylori infection   . H/O hiatal hernia   . Hemorrhoids   . History of UTI   . Hx of radiation therapy 10/25/12- 12/11/12   right chest wall/regional lymph nodes 5040 cGy, 28 sessions, right chest wall boost 1000 cGy 1 session  . Hx: UTI (urinary tract infection)   . Hyperlipidemia    but not on meds;diet and exercise controlled  . Hypertension    recently started Aldactone   . Insomnia    takes Melatoniin daily  . Metastasis to spinal column ([facility])    T5, Left  Femur cancer- radiation   . Neuropathy    "from back surgery"  . PONV (postoperative nausea and vomiting)    pt experienced hair loss, confusion and combative- 02/15/15  . Right knee pain   . Right shoulder pain   . Skin cancer    squamous, Nodule to back - "same cancer as breast."  . Urinary frequency    d/t taking Aldactone    SURGICAL HISTORY: Past Surgical History:  Procedure Laterality Date  . COLONOSCOPY    . cyst removed from left breast  1970  . DECOMPRESSIVE LUMBAR LAMINECTOMY LEVEL 4 N/A 02/15/2015   Procedure: DECOMPRESSION T5 AND T3-T7 STABALIZATION;  Surgeon: NConsuella Lose MD;  [facility];  Service: Neurosurgery;  Laterality: N/A;  . ESOPHAGOGASTRODUODENOSCOPY    . LAMINECTOMY N/A 03/27/2015   Procedure: Thoracic Four-Thoracic Six Laminectomy for tumor;  Surgeon: NConsuella Lose MD;  [facility];  Service: Neurosurgery;  Laterality: N/A;  T4-T6 Laminectomy  . left wrist surgery   2004   with plate  . MASTECTOMY MODIFIED RADICAL  05/10/2012   Procedure: MASTECTOMY MODIFIED RADICAL;  Surgeon: PMerrie Roof MD;  [place] [facility]  Service: General;  Laterality: Right;  RIGHT MODIFIED RADICAL MASTECTOMY  . MASTECTOMY, RADICAL Right   . MINOR BREAST BIOPSY Left 05/16/2016    Procedure: EXCISION OF BACK NODULE;  Surgeon: PAutumn MessingIII, MD;  [place] [facility]  Service: General;  Laterality: Left;  . Nodule  back  05/17/2015  . PORTACATH PLACEMENT  05/10/2012   Procedure: INSERTION PORT-A-CATH;  Surgeon: PMerrie Roof MD;  [facility];  Service: General;  Laterality: Left;    SOCIAL HISTORY: Social History   Socioeconomic History  . Marital status: Married    Spouse name: Not on file  . Number of children:  2  . Years of education: Not on file  . Highest education level: Not on file  Occupational History  . Not on file  Social Needs  . Financial resource strain: Not on file  . Food insecurity:    Worry: Not on file    Inability: Not on file  . Transportation needs:    Medical: Not on file    Non-medical: Not on file  Tobacco Use  . Smoking status: Never Smoker  . Smokeless tobacco: Never Used  Substance and Sexual Activity  . Alcohol use: No  . Drug use: No  . Sexual activity: Yes    Birth control/protection: Post-menopausal  Lifestyle  . Physical activity:    Days per week: Not on file    Minutes per session: Not on file  . Stress: Not on file  Relationships  . Social connections:    Talks on phone: Not on file    Gets together: Not on file    Attends religious service: Not on file    Active member of club or organization: Not on file    Attends meetings of clubs or organizations: Not on file    Relationship status: Not on file  . Intimate partner violence:    Fear of current or ex partner: Not on file    Emotionally abused: Not on file    Physically abused: Not on file    Forced sexual activity: Not on file  Other Topics Concern  . Not on file  Social History Narrative  . Not on file    FAMILY HISTORY: Family History  Problem Relation Age of Onset  . Leukemia Mother 67       deceased 53  . Stroke Father   . Diabetes Mellitus II Father   . Prostate cancer Brother 44       currently 53  . Stomach  cancer Maternal Grandmother 87       deceased 1  . Prostate cancer Brother 22       currently 75    Review of Systems  Constitutional: Positive for fatigue. Negative for appetite change, chills and fever.  HENT:   Negative for hearing loss, lump/mass and trouble swallowing.   Eyes: Negative for eye problems and icterus.  Respiratory: Negative for chest tightness, cough and shortness of breath.   Cardiovascular: Negative for chest pain, leg swelling and palpitations.  Gastrointestinal: Negative for abdominal distention, abdominal pain and constipation.  Endocrine: Negative for hot flashes.  Musculoskeletal: Positive for arthralgias and back pain. Negative for gait problem.  Skin: Negative for itching and rash.  Neurological: Positive for headaches. Negative for dizziness, extremity weakness, gait problem, numbness, seizures and speech difficulty.  Hematological: Negative for adenopathy. Does not bruise/bleed easily.  Psychiatric/Behavioral: Positive for sleep disturbance. Negative for depression. The patient is nervous/anxious.       PHYSICAL EXAMINATION  ECOG PERFORMANCE STATUS: 2 - Symptomatic, <50% confined to bed  Vitals:   04/04/18 0907  BP: (!) 137/58  Pulse: 73  Resp: 18  Temp: 97.8 F (36.6 C)  SpO2: 100%    Physical Exam  Constitutional: She is oriented to person, place, and time. She appears well-developed and well-nourished.  HENT:  Head: Normocephalic and atraumatic.  Mouth/Throat: Oropharynx is clear and moist. No oropharyngeal exudate.  Eyes: Pupils are equal, round, and reactive to light. No scleral icterus.  Neck: Neck supple.  Cardiovascular: Normal rate, regular rhythm and normal heart sounds.  Pulmonary/Chest: Effort normal and breath sounds normal.  Abdominal: Soft. Bowel sounds are normal. She exhibits no distension and no mass. There is no tenderness. There is no guarding.  Musculoskeletal: She exhibits no edema.  Lymphadenopathy:    She has no  cervical adenopathy.  Neurological: She is alert and oriented to person, place, and time.  Skin: Skin is warm and dry.  Subcutaneous metastases are larger, there is one on upper thoracic midline spine that is associated with erythema, and close to breaking through the surface of the skin.  Psychiatric:  Anxious affect     LABORATORY DATA:  CBC    Component Value Date/Time   WBC 2.7 (L) 04/04/2018 0813   WBC 6.2 07/27/2017 0836   RBC 3.47 (L) 04/04/2018 0813   HGB 11.4 (L) 04/04/2018 0813   HGB 12.0 05/05/2017 1046   HCT 33.7 (L) 04/04/2018 0813   HCT 36.9 05/05/2017 1046   PLT 184 04/04/2018 0813   PLT 210 05/05/2017 1046   MCV 97.1 04/04/2018 0813   MCV 83.1 05/05/2017 1046   MCH 32.9 04/04/2018 0813   MCHC 33.8 04/04/2018 0813   RDW 16.0 (H) 04/04/2018 0813   RDW 14.8 (H) 05/05/2017 1046   LYMPHSABS 0.4 (L) 04/04/2018 0813   LYMPHSABS 0.6 (L) 05/05/2017 1046   MONOABS 0.4 04/04/2018 0813   MONOABS 0.3 05/05/2017 1046   EOSABS 0.0 04/04/2018 0813   EOSABS 0.1 05/05/2017 1046   BASOSABS 0.1 04/04/2018 0813   BASOSABS 0.1 05/05/2017 1046    CMP     Component Value Date/Time   NA 139 04/04/2018 0813   NA 137 05/05/2017 1046   K 3.8 04/04/2018 0813   K 3.9 05/05/2017 1046   CL 103 04/04/2018 0813   CL 101 10/25/2012 0946   CO2 26 04/04/2018 0813   CO2 26 05/05/2017 1046   GLUCOSE 121 (H) 04/04/2018 0813   GLUCOSE 88 05/05/2017 1046   GLUCOSE 90 10/25/2012 0946   BUN 14 04/04/2018 0813   BUN 14.1 05/05/2017 1046   CREATININE 0.83 04/04/2018 0813   CREATININE 0.8 05/05/2017 1046   CALCIUM 10.2 04/04/2018 0813   CALCIUM 10.3 05/05/2017 1046   PROT 7.2 04/04/2018 0813   PROT 7.2 05/05/2017 1046   ALBUMIN 3.9 04/04/2018 0813   ALBUMIN 3.7 05/05/2017 1046   AST 31 04/04/2018 0813   AST 26 05/05/2017 1046   ALT 16 04/04/2018 0813   ALT 17 05/05/2017 1046   ALKPHOS 66 04/04/2018 0813   ALKPHOS 74 05/05/2017 1046   BILITOT 0.5 04/04/2018 0813   BILITOT 0.43  05/05/2017 1046   GFRNONAA >60 04/04/2018 0813   GFRAA >60 04/04/2018 0813      RADIOGRAPHIC STUDIES:  Nm Pet Image Restag (ps) Skull Base To Thigh  Result Date: 04/03/2018 CLINICAL DATA:  Subsequent treatment strategy for metastatic right breast cancer. EXAM: NUCLEAR MEDICINE PET SKULL BASE TO THIGH TECHNIQUE: 6.3 mCi F-18 FDG was injected intravenously. Full-ring PET imaging was performed from the skull vertex to knees after the radiotracer. CT data was obtained and used for attenuation correction and anatomic localization. Fasting blood glucose: 97 mg/dl COMPARISON:  Multiple exams, including 04/18/2017 and CT scan from 12/13/2017. FINDINGS: Mediastinal blood pool activity: SUV max 2.4 NECK: The technologist elected to include the entire head and not just the neck. There is a small right calvarial metastatic lesion, maximum SUV is difficult to measure due to volume averaging of adjacent brain, but there is felt to be some hypermetabolic activity. Incidental CT findings: none CHEST: Nodular lesions along the  upper thoracic posterior chest wall are observed mostly along the subcutaneous tissues, including a 4.4 by 3.7 cm lesion on image 65/4 (formerly 2.5 by 2.0 cm) with maximum SUV 7.9 (formerly 8.0). A separate midline posterior lesion in the subcutaneous tissues measures 1.6 by 1.5 cm on image 68/4 (previously indistinct) and has a maximum SUV of 7.3. Additional more superficial subcutaneous nodularity is new and measures 1.4 by 1.1 cm on image 73/4, with maximum SUV 6.2. Low-grade activity in small left axillary lymph nodes. Radiation therapy related findings in the lungs. Airspace opacity posteriorly in the right lower lobe with associated mild nodularity with low-grade activity, maximum SUV 3.3, formerly 3.9. Incidental CT findings: Right Port-A-Cath tip: Cavoatrial junction. Atherosclerotic calcification of the aortic arch. ABDOMEN/PELVIS: Stable mild nodularity along the right adrenal gland,  maximum SUV 4.0, formerly 2.5. The nodularity in this vicinity measures up to 0.9 cm in diameter. A 9 mm nodule eccentric to the left in the subcutaneous tissues of the pubis previously measured 6 mm, and has a maximum SUV of 3.3, previously not metabolic. Incidental CT findings: Unremarkable SKELETON: Compared to prior PET-CT there is a new 1.5 cm right L2 vertebral body metastatic lesion with maximum SUV 10.2. There is also a 1.6 by 1.4 cm metastatic lesion eccentric to the left at the T12 vertebral level which is essentially new from the prior PET-CT, with maximum SUV 3.8. There is a small sclerotic lesion in this location before but without metabolic activity. The previous lower thoracic spine activity approximately at the T8-9 vertebral level eccentric to the right is no longer appreciable. There is a hypermetabolic lesion in the distal left femoral metaphysis, maximum SUV 3.7 common not readily appreciable on the prior exam. There is mildly accentuated activity in a sclerotic region of the right distal femoral metaphysis, maximum SUV 2.9 today and previously 2.2 in this region. This could be from red marrow or tumor infiltration. No significant residual activity in the right posterior eighth rib lesion. Mixed sclerosis and lucency in the right acromion measuring about 2.2 cm in diameter on image 62/4, maximum SUV 3.6, or as on the contralateral normal side the maximum SUV is 1.5. Incidental CT findings: None IMPRESSION: 1. Some of the bony lesions such as the T8-9 lesion and the right eighth rib lesion have resolved in metabolic activity compared to the prior exam. However, there are multiple new bony lesions including calvarial metastatic lesions, a new right L2 hypermetabolic lesion, a new I95 lesion, a new lesion in the left distal femur and possibly the right distal femur, and a new lesion in the right acromion. Overall appearance is progressive. 2. Posterior upper thoracic wall chest lesions are  progressive in general, increased in size and with new hypermetabolic nodularity in this region. 3. Areas of radiation therapy in the lung without definite residual malignancy. At the right lung base there is some low-grade residual activity, less than prior, likely from radiation fibrosis/pneumonitis. 4. Stable nodularity at 9 mm in diameter of the right adrenal gland, but slightly increased in metabolic activity, maximum SUV 4.0. This merits surveillance. 5. A 9 mm soft tissue nodule eccentric to the left in the subcutaneous tissues of the pubis is mildly hypermetabolic. Technically nonspecific but merit surveillance. Electronically Signed   By: Van Clines M.D.   On: 04/03/2018 13:51      ASSESSMENT and PLAN:   Primary cancer of upper outer quadrant of right female breast ([place]) Right breast invasive ductal carcinoma ER positive PR negative  HER-2 positive Ki-67 44% multifocal disease 3/7 lymph nodes positive T2 N1 M0 stage IIB status post adjuvant chemotherapy with TCH followed by Herceptin maintenance, adjuvant radiation therapy and Letrozole. MRI 02/14/2015: T5 large destructive lesion with pathologic compression fracture and extensive epidural tumor, involvement of T4, excision metastatic carcinoma ER 60%, PR 0%, HER-2 positive ratio 2.71 average copy #7.45 03/27/2015: T4-6 decompression surgery for spinal cord compression PET-CT 12/10/17Rt para-spinal mass mildy hypermetabolic SUV 5.9, lytic cortical lesion 4.8 cm lesion left prox femur suv 8.8 favor osseus met disease Echo 04/12/2017 shows well preserved ejection fraction of 55-60%   Treatment summary: 1. Resumed anastrozole 05/21/2015, but it has caused significant hair loss so we switched her to exemestane 25 mg once daily 07/23/2015 2. Herceptin every 3 week started 04/10/2015 3. Bone metastases: Zometa every 3 months-next duelate May/early June 4. Radiation therapy to the spine (Dr. [name]) 04/13/2015 to 05/19/2015 5.  Radiation therapy to left femur 04/28/2016 to 05/12/2016  6. SRS to the brain lesion 11/24/2016 7.Kadcyla started 05/05/2017 discontinued 07/27/2017 8.Palbociclib +Faslodex +trastuzumab+ Pertuzumabstarted 08/10/2017, stopped 04/04/2018 -------------------------------------------------------------------------------------------------------------------------- Unfortunately, Kyah's most recent scans are consistent with disease progression. I spent a great deal of time reviewing her scans with her in detail and discussing her symptoms with her associated with her disease progression.  I apologized for the fact that [name] is not here today to review these scans, but that he will meet with her next week.  Aime decided, that since her treatments make her feel poorly, and that since the scan indicates they are no longer working, she will forego treatment today along with restarting the Palbociclib so that she can better enjoy Thanksgiving with her family, and return next week to discuss other options.  Malini typically does not take any pain medications, but if she needs one, she will take her two year old Hudson.  I sent her in a refill of Norco today, that she can have if needed for pain.  I recommended she take her Xanax 1/2 tablet tonight to help her sleep and her muscles relax.    I attempted to get Layann scheduled for her treatments next week, however, there were no appointments available in the infusion room.  She noted that she really doesn't want anything scheduled at this point, and did not want me to inquire with administration about finding her a spot.  She will see [name] next week, and I placed a note on his desk to please also call her when he returns on Monday.     All questions were answered. The patient knows to call the clinic with any problems, questions or concerns. We can certainly see the patient much sooner if necessary.  A total of (30) minutes of face-to-face time was  spent with this patient with greater than 50% of that time in counseling and care-coordination.  This note was electronically signed. Scot Dock, NP 04/05/2018

## 2018-04-04 NOTE — Telephone Encounter (Signed)
Called regarding 12/6 I left a message

## 2018-04-05 ENCOUNTER — Encounter: Payer: Self-pay | Admitting: Adult Health

## 2018-04-05 NOTE — Assessment & Plan Note (Signed)
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 MRI 02/14/2015: T5 large destructive lesion with pathologic compression fracture and extensive epidural tumor, involvement of T4, excision metastatic carcinoma ER 60%, PR 0%, HER-2 positive ratio 2.71 average copy #7.45 03/27/2015: T4-6 decompression surgery for spinal cord compression PET-CT 12/10/17Rt para-spinal mass mildy hypermetabolic SUV 5.9, lytic cortical lesion 4.8 cm lesion left prox femur suv 8.8 favor osseus met disease Echo 04/12/2017 shows well preserved ejection fraction of 55-60%   Treatment summary: 1. Resumed anastrozole 05/21/2015, but it has caused significant hair loss so we switched her to exemestane 25 mg once daily 07/23/2015 2. Herceptin every 3 week started 04/10/2015 3. Bone metastases: Zometa every 3 months-next duelate May/early June 4. Radiation therapy to the spine (Dr. [name]) 04/13/2015 to 05/19/2015 5. Radiation therapy to left femur 04/28/2016 to 05/12/2016  6. SRS to the brain lesion 11/24/2016 7.Kadcyla started 05/05/2017 discontinued 07/27/2017 8.Palbociclib +Faslodex +trastuzumab+ Pertuzumabstarted 08/10/2017, stopped 04/04/2018 -------------------------------------------------------------------------------------------------------------------------- Unfortunately, Heather [name]'s most recent scans are consistent with disease progression. I spent a great deal of time reviewing her scans with her in detail and discussing her symptoms with her associated with her disease progression.  I apologized for the fact that [name] is not here today to review these scans, but that he will meet with her next week.  Heather [name] decided, that since her treatments make her feel poorly, and that since the scan  indicates they are no longer working, she will forego treatment today along with restarting the Palbociclib so that she can better enjoy Thanksgiving with her family, and return next week to discuss other options.  Heather [name] typically does not take any pain medications, but if she needs one, she will take her two year old Heather [name].  I sent her in a refill of Norco today, that she can have if needed for pain.  I recommended she take her Xanax 1/2 tablet tonight to help her sleep and her muscles relax.    I attempted to get Heather [name] scheduled for her treatments next week, however, there were no appointments available in the infusion room.  She noted that she really doesn't want anything scheduled at this point, and did not want me to inquire with administration about finding her a spot.  She will see [name] next week, and I placed a note on his desk to please also call her when he returns on Monday.

## 2018-04-09 ENCOUNTER — Other Ambulatory Visit: Payer: Medicare Other

## 2018-04-09 ENCOUNTER — Ambulatory Visit (HOSPITAL_COMMUNITY)
Admission: RE | Admit: 2018-04-09 | Discharge: 2018-04-09 | Disposition: A | Discharge status: Home / Self Care | Payer: Medicare Other | Source: Ambulatory Visit | Attending: Internal Medicine | Admitting: Internal Medicine

## 2018-04-09 ENCOUNTER — Telehealth: Payer: Self-pay | Admitting: Hematology and Oncology

## 2018-04-09 ENCOUNTER — Other Ambulatory Visit: Payer: Self-pay | Admitting: Radiation Therapy

## 2018-04-09 ENCOUNTER — Ambulatory Visit (HOSPITAL_BASED_OUTPATIENT_CLINIC_OR_DEPARTMENT_OTHER)
Admission: RE | Admit: 2018-04-09 | Discharge: 2018-04-09 | Disposition: A | Discharge status: Home / Self Care | Payer: Medicare Other | Source: Ambulatory Visit | Attending: Internal Medicine | Admitting: Internal Medicine

## 2018-04-09 VITALS — BP 130/78 | HR 71 | Wt 134.0 lb

## 2018-04-09 DIAGNOSIS — I340 Nonrheumatic mitral (valve) insufficiency: Secondary | ICD-10-CM | POA: Insufficient documentation

## 2018-04-09 DIAGNOSIS — E785 Hyperlipidemia, unspecified: Secondary | ICD-10-CM | POA: Insufficient documentation

## 2018-04-09 DIAGNOSIS — C7951 Secondary malignant neoplasm of bone: Secondary | ICD-10-CM | POA: Diagnosis not present

## 2018-04-09 DIAGNOSIS — C50911 Malignant neoplasm of unspecified site of right female breast: Secondary | ICD-10-CM

## 2018-04-09 DIAGNOSIS — I10 Essential (primary) hypertension: Secondary | ICD-10-CM | POA: Diagnosis not present

## 2018-04-09 NOTE — Progress Notes (Signed)
CARDIO-ONCOLOGY CLINIC NOTE  Patient ID: Heather [name], female   DOB: [birth year], 72 y.o.   MRN: [id number]  Referring Oncologist: Dr [name] Oncologistt: Dr [name] PCP: Dr [name] General Surgeon: Dr Marlou Starks  HPI: [name] is a 72 year old female with invasive ductal carcinoma in R breast that is ER positive and HER-2/neu positive,  S/P R mastectomy and lymph node dissection 05/10/12.    She has been treated for HTN in the past but stopped taking the medications due to elevated renal function. Intolerant Ace-I due to cough.    She received a total of 6 cycles of Taxotere/carboplatin starting on 06/14/2012 and finishing in May. XRT finished in August 2014. Now receiving Herceptin every 3 weeks to end at the end of February 2015.    She finished initial Herceptin 2015 but unfortunately then developed pathological vertebral fracture with cord compression and paralysis. Now back on Herceptin since 12/16.  Found to have metastatic lesion to left femur. Underwent XRT.   Subsequently found to have a met to her 8th rib. Perjeta added to Herceptin starting in March 2018. Also getting Faslodex injections.    Here for f/u. Was on Andria Frames (stopped 3 weeks ago). Remains on Herceptin/Perjeta & Faslodex. Recent PET scan with progression of mets. Has meeting with [name] to discuss further plans. No SOB, edema, orthopnea or PND.   Echo today EF 55-60% GLS - 16.8%   Echo today EF 55-60%  GLS -23.5% Personally reviewed  Echo 12/18 EF 55-60% Grade I DD GLS -21.3% LS 7.4 cm/s (poor window)  SUMMARY OF ONCOLOGIC HISTORY:    Primary cancer of upper outer quadrant of right female breast ([place])   03/08/2012 Initial Diagnosis    Right breast invasive ductal carcinoma ER positive PR negative HER-2 positive Ki-67 44%; another breast mass biopsied in the anterior part of the breast which was also positive for malignancy that was HER-2 negative      05/10/2012 Surgery    Right mastectomy and  axillary lymph node dissection: Multifocal disease 5 cm, 1.7 cm, 1.6 cm, ER positive PR negative HER-2 positive Ki-67 44%, 3/7 lymph nodes positive      06/14/2012 - 06/12/2013 Chemotherapy    Adjuvant chemotherapy with [place] 6 followed by Herceptin maintenance      10/25/2012 - 12/11/2012 Radiation Therapy    Adjuvant radiation therapy      02/07/2013 -  Anti-estrogen oral therapy    Letrozole 2.5 mg daily      02/14/2015 Imaging    MRI spine: Large destructive T5 lesion with severe pathologic compression fracture and extensive epidural tumor, severe spinal stenosis with moderate cord compression, tumor involvement of T4      03/18/2015 PET scan    Residual enhancing soft tissue adjacent to the spinal cord. No evidence of metastatic disease. Nonspecific uptake the left nipple      03/27/2015 - 03/30/2015 Hospital Admission    T4-6 decompression for spinal cord compression (lower extremity paralysis)      04/10/2015 -  Chemotherapy    Palliative treatment with Herceptin every 3 weeks with letrozole 2.5 mg daily      04/13/2015 - 05/19/2015 Radiation Therapy    Palliative radiation treatment to the spine      09/01/2015 Imaging    CT chest abdomen pelvis: Pathologic fracture with posterior fusion at T5 no other evidence of metastatic disease in the chest abdomen pelvis      12/23/2015 Imaging    CT CAP: new nonspecific 0.6  cm lymph node was stated mediastinum needs follow-up CT, innumerable tiny groundglass pulmonary nodules throughout both lungs unchanged, patchy consolidation from radiation, T5 fracture, no mets      04/15/2016 PET scan    Rt para-spinal mass mildy hypermetabolic SUV 5.9, lytic cortical lesion 4.8 cm lesion left prox femur suv 8.8 favor osseus met disease      04/28/2016 - 05/12/2016 Radiation Therapy    Palliative XRT Left femur      05/24/2016 Procedure    Soft tissue mass biopsy back: Metastatic  breast cancer ER 80%, PR 0%, Ki-67 50%, HER-2 positive ratio 2.25      06/09/2016 -  Anti-estrogen oral therapy    Faslodex with Herceptin and Perjeta every 4 weeks      06/13/2016 - 06/15/2016 Hospital Admission    uncomplicated revision of previous thoracic instrumentation.        03/28/12 ECHO 60-65% Lateral S' 9.3 09/17/12 ECHO 55% lateral s' 9.4 03/05/13 ECHO EF 55% lateral s' 11.1 1/15 ECHO EF 60-65%, lateral s' 11.3, global longitudinal strain -22.9% 8/15 Echo EF 60% lateral s 11.2, GLS -25% 10/07/2015  ECHO EF 55-60% laterals s' 8.7 cm/s GLS -21.1% mild MR. Grade I DD 01/07/2016 Echo EF 55-60% GLS -21.2% Lateral s' 10.2 cm/s Mild MR/TR Grade I DD  04/29/16 Echo EF 60-65% lateral s' 9.6 cm/s GLS -17.9% (but poor tracking) If use 2 images with good tracking it is (21.4%)  11/02/16 Echo EF 60-65% GLS -22.13 Lat s' 10.7  Labs (1/15): K 3.9, creatinine 1.0 Labs  (7/15) K 4.2 Cr 1.2  Past Medical History:  Diagnosis Date  . Anemia    hx of  . Anxiety    r/t updated surgery  . Breast cancer ([place])    right  . Cataract    immature-not sure of which eye  . Complication of anesthesia    pt states she is sensitive to meds  . Dizziness    has been going on for 75month;medical MD aware  . Genetic testing 07/11/2016   Test Results: Normal; No pathogenic mutations detected  Genes Analyzed: 43 genes on [facility] Common Cancers panel (APC, ATM, AXIN2, BARD1, BMPR1A, BRCA1, BRCA2, BRIP1, CDH1, CDKN2A, CHEK2, DICER1, EPCAM, GREM1, HOXB13, KIT, MEN1, MLH1, MSH2, MSH6, MUTYH, NBN, NF1, PALB2, PDGFRA, PMS2, POLD1, POLE, PTEN, RAD50, RAD51C, RAD51D, SDHA, SDHB, SDHC, SDHD, SMAD4, SMARCA4, STK11, TP53, TSC1, TSC2, VHL).  .Marland KitchenGERD (gastroesophageal reflux disease)    Tums prn  . H. pylori infection   . H/O hiatal hernia   . Hemorrhoids   . History of UTI   . Hx of radiation therapy 10/25/12- 12/11/12   right chest wall/regional lymph nodes 5040 cGy, 28 sessions, right chest wall boost 1000  cGy 1 session  . Hx: UTI (urinary tract infection)   . Hyperlipidemia    but not on meds;diet and exercise controlled  . Hypertension    recently started Aldactone   . Insomnia    takes Melatoniin daily  . Metastasis to spinal column ([facility])    T5, Left  Femur cancer- radiation   . Neuropathy    "from back surgery"  . PONV (postoperative nausea and vomiting)    pt experienced hair loss, confusion and combative- 02/15/15  . Right knee pain   . Right shoulder pain   . Skin cancer    squamous, Nodule to back - "same cancer as breast."  . Urinary frequency    d/t taking Aldactone    Current Outpatient Medications  Medication Sig Dispense Refill  . ALPRAZolam (XANAX) 0.25 MG tablet Take 1 tablet by mouth as needed.    Marland Kitchen b complex vitamins tablet Take 1 tablet by mouth daily.    . Calcium Carbonate Antacid (TUMS PO) Take 2 tablets by mouth 2 (two) times daily as needed (acid reflux).    . Cholecalciferol (VITAMIN D) 2000 UNITS tablet Take 2,000 Units by mouth daily.    Marland Kitchen gabapentin (NEURONTIN) 600 MG tablet Take 1 tablet (600 mg total) by mouth as directed. Take 1 tablet in the AM and afternoon and 1 1/2 tabs at bedtime (Patient taking differently: Take 600 mg by mouth 3 (three) times daily. Take 1 tablet in the AM and afternoon and 1 1/2 tabs at bedtime) 315 tablet 2  . lidocaine-prilocaine (EMLA) cream Apply to affected area once (Patient taking differently: Apply 1 application topically every 30 (thirty) days. Apply to port prior to infusions) 30 g 3  . OVER THE COUNTER MEDICATION Place 1 drop into both eyes 2 (two) times daily as needed (dry eyes). Over the counter lubricating eye drops    . polyethylene glycol (MIRALAX / GLYCOLAX) packet Take 17 g by mouth daily as needed.    . Probiotic Product (PROBIOTIC DAILY PO) Take 1 capsule by mouth daily.     Marland Kitchen spironolactone (ALDACTONE) 25 MG tablet Take 25 mg by mouth daily.    . Coenzyme Q10 (COQ10) 100 MG CAPS Take 100 mg by mouth as needed.  Days that she takes pravastatin MWF.    Marland Kitchen fluticasone (FLONASE) 50 MCG/ACT nasal spray Place 1 spray into both nostrils daily as needed.    Marland Kitchen HYDROcodone-acetaminophen (NORCO/VICODIN) 5-325 MG tablet Take 1 tablet by mouth every 6 (six) hours as needed for moderate pain. (Patient not taking: Reported on 04/09/2018) 30 tablet 0  . tobramycin-dexamethasone (TOBRADEX) ophthalmic solution Place 1 drop into both eyes every 6 (six) hours. (Patient not taking: Reported on 04/09/2018) 5 mL 0   No current facility-administered medications for this encounter.    Facility-Administered Medications Ordered in Other Encounters  Medication Dose Route Frequency Provider Last Rate Last Dose  . sodium chloride 0.9 % injection 10 mL  10 mL Intravenous PRN [name], MD   10 mL at 04/26/17 0828     Allergies  Allergen Reactions  . Other Itching    Chlorhexadine Cloth wipes  . Acyclovir And Related   . Chlorhexidine   . Zanaflex [Tizanidine Hcl] Hives    Bumps  . Codeine     " loopy".   02/26/15: Also patient does not want to take oxycodone either (makes her feel like a "zombie")  . Keflex [Cephalexin]     GI Upset  . Naprosyn [Naproxen] Other (See Comments)    GI ipset   . Oxycodone Other (See Comments)    Made her feel like a "zombie" 02/25/15. Patient does not want to take oxycodone.  . Tramadol Itching  . Tussin [Guaifenesin] Other (See Comments)    Dizzy     Social History   Socioeconomic History  . Marital status: Married    Spouse name: Not on file  . Number of children: 2  . Years of education: Not on file  . Highest education level: Not on file  Occupational History  . Not on file  Social Needs  . Financial resource strain: Not on file  . Food insecurity:    Worry: Not on file    Inability: Not on file  . Transportation  needs:    Medical: Not on file    Non-medical: Not on file  Tobacco Use  . Smoking status: Never Smoker  . Smokeless tobacco: Never Used  Substance and  Sexual Activity  . Alcohol use: No  . Drug use: No  . Sexual activity: Yes    Birth control/protection: Post-menopausal  Lifestyle  . Physical activity:    Days per week: Not on file    Minutes per session: Not on file  . Stress: Not on file  Relationships  . Social connections:    Talks on phone: Not on file    Gets together: Not on file    Attends religious service: Not on file    Active member of club or organization: Not on file    Attends meetings of clubs or organizations: Not on file    Relationship status: Not on file  . Intimate partner violence:    Fear of current or ex partner: Not on file    Emotionally abused: Not on file    Physically abused: Not on file    Forced sexual activity: Not on file  Other Topics Concern  . Not on file  Social History Narrative  . Not on file    Family History  Problem Relation Age of Onset  . Leukemia Mother 29       deceased 39  . Stroke Father   . Diabetes Mellitus II Father   . Prostate cancer Brother 34       currently 56  . Stomach cancer Maternal Grandmother 24       deceased 66  . Prostate cancer Brother 87       currently 55    PHYSICAL EXAM: Vitals:   04/09/18 1019 04/09/18 1024  BP: (!) 150/78 130/78  Pulse: 71   SpO2: 96%    General:  Well appearing. No resp difficulty HEENT: normal Neck: supple. no JVD. Carotids 2+ bilat; no bruits. No lymphadenopathy or thryomegaly appreciated. Cor: PMI nondisplaced. Regular rate & rhythm. No rubs, gallops or murmurs. Lungs: clear Abdomen: soft, nontender, nondistended. No hepatosplenomegaly. No bruits or masses. Good bowel sounds. Extremities: no cyanosis, clubbing, rash, edema Neuro: alert & orientedx3, cranial nerves grossly intact. moves all 4 extremities w/o difficulty. Affect pleasant    ASSESSMENT & PLAN: 1. R breast CA with bone mets - initial Herceptin completed in 2015. - Palliative Herceptin start 12/16. Added Perjeta in 3/18 - Started Andria Frames early 2019    - now with progressive metastatic disease - I reviewed echos personally. EF and Doppler parameters stable. No HF on exam. Continue Herceptin/Perjeta for now - Further screening for cardiotoxicity to be determined byt what happens with her chemo regimen  2. HTN:  - mildly elevated but stable. Continue current regimen.     Daniel Bensimhon,MD 10:55 AM

## 2018-04-09 NOTE — Patient Instructions (Signed)
Your physician has requested that you have an echocardiogram. Echocardiography is a painless test that uses sound waves to create images of your heart. It provides your doctor with information about the size and shape of your heart and how well your heart's chambers and valves are working. This procedure takes approximately one hour. There are no restrictions for this procedure.  Your physician recommends that you schedule a follow-up appointment in: 6 months with Dr. Sung Amabile and an ECHO

## 2018-04-09 NOTE — Progress Notes (Signed)
  Echocardiogram 2D Echocardiogram has been performed.  Heather [name] G Dillon Liver[place] 04/09/2018, 9:57 AM

## 2018-04-09 NOTE — Telephone Encounter (Signed)
I informed the patient that we will cancel further appointments until we get the molecular testing results back We will make your appointments based upon the test results

## 2018-04-10 NOTE — Progress Notes (Signed)
Brain and Spine Tumor Board Documentation  Heather [name] was presented by Cecil Cobbs, MD at Brain and Spine Tumor Board on 04/10/2018, which included representatives from neuro oncology, radiation oncology, surgical oncology, navigation, pathology, radiology.  Heather [name] was presented as a current patient with history of the following treatments:  .  Additionally, we reviewed previous medical and familial history, history of present illness, and recent lab results along with all available histopathologic and imaging studies. The tumor board considered available treatment options and made the following recommendations:  Additional observation defer rx unless symptomatic  Tumor board is a meeting of clinicians from various specialty areas who evaluate and discuss patients for whom a multidisciplinary approach is being considered. Final determinations in the plan of care are those of the provider(s). The responsibility for follow up of recommendations given during tumor board is that of the provider.   Today's extended care, comprehensive team conference, Heather [name] was not present for the discussion and was not examined.

## 2018-04-12 ENCOUNTER — Ambulatory Visit
Admission: RE | Admit: 2018-04-12 | Discharge: 2018-04-12 | Disposition: A | Discharge status: Home / Self Care | Payer: Medicare Other | Source: Ambulatory Visit | Attending: Radiation Oncology | Admitting: Radiation Oncology

## 2018-04-12 DIAGNOSIS — C7931 Secondary malignant neoplasm of brain: Secondary | ICD-10-CM

## 2018-04-12 DIAGNOSIS — R930 Abnormal findings on diagnostic imaging of skull and head, not elsewhere classified: Secondary | ICD-10-CM | POA: Diagnosis not present

## 2018-04-12 DIAGNOSIS — C7949 Secondary malignant neoplasm of other parts of nervous system: Secondary | ICD-10-CM

## 2018-04-12 MED ORDER — GADOBENATE DIMEGLUMINE 529 MG/ML IV SOLN
10.0000 mL | Freq: Once | INTRAVENOUS | Status: AC | PRN
  Administered 2018-04-12: 10 mL via INTRAVENOUS

## 2018-04-13 ENCOUNTER — Ambulatory Visit: Payer: Medicare Other | Admitting: Hematology and Oncology

## 2018-04-13 ENCOUNTER — Other Ambulatory Visit: Payer: Medicare Other

## 2018-04-13 ENCOUNTER — Ambulatory Visit: Payer: Medicare Other

## 2018-04-16 ENCOUNTER — Other Ambulatory Visit: Payer: Medicare Other

## 2018-04-18 ENCOUNTER — Telehealth: Payer: Self-pay

## 2018-04-18 ENCOUNTER — Ambulatory Visit
Admission: RE | Admit: 2018-04-18 | Discharge: 2018-04-18 | Disposition: A | Discharge status: Home / Self Care | Payer: Medicare Other | Source: Ambulatory Visit | Attending: Urology | Admitting: Urology

## 2018-04-18 ENCOUNTER — Encounter: Payer: Self-pay | Admitting: Urology

## 2018-04-18 VITALS — BP 154/74 | HR 82 | Temp 97.8°F | Resp 20 | Ht 64.0 in | Wt 136.6 lb

## 2018-04-18 DIAGNOSIS — C7951 Secondary malignant neoplasm of bone: Secondary | ICD-10-CM | POA: Diagnosis not present

## 2018-04-18 DIAGNOSIS — C7931 Secondary malignant neoplasm of brain: Secondary | ICD-10-CM | POA: Diagnosis not present

## 2018-04-18 DIAGNOSIS — Z170 Estrogen receptor positive status [ER+]: Secondary | ICD-10-CM | POA: Diagnosis not present

## 2018-04-18 DIAGNOSIS — Z08 Encounter for follow-up examination after completed treatment for malignant neoplasm: Secondary | ICD-10-CM | POA: Diagnosis not present

## 2018-04-18 DIAGNOSIS — C7949 Secondary malignant neoplasm of other parts of nervous system: Secondary | ICD-10-CM | POA: Diagnosis not present

## 2018-04-18 DIAGNOSIS — C50911 Malignant neoplasm of unspecified site of right female breast: Secondary | ICD-10-CM | POA: Diagnosis not present

## 2018-04-18 DIAGNOSIS — Z853 Personal history of malignant neoplasm of breast: Secondary | ICD-10-CM | POA: Diagnosis not present

## 2018-04-18 DIAGNOSIS — Z79899 Other long term (current) drug therapy: Secondary | ICD-10-CM | POA: Insufficient documentation

## 2018-04-18 NOTE — Progress Notes (Signed)
Per Dr.Gudena, send molecular testing to [facility]. Faxed with confirmation today 04/18/18.

## 2018-04-18 NOTE — Progress Notes (Signed)
Mrs. Magner is a 72 y.o. woman with ER positive, PR negative, HER-2 positive metastatic breast cancer with multifocal metastasis involving the spine, bony skeleton, lung, and brain. She is here to review recent PET and Brain MRI images.  She is status post SRS to her right cerebellar.  Headache: No Dizziness: No Nausea/vomiting: No Diplopia: No Ringing in ears: Has some hearing loss, feels like it associated with the repeated MRI scans. Visual changes: No Fatigue: Fatigue has much improved. Cognitive changes: No   BP (!) 154/74 (BP Location: Left Arm, Patient Position: Sitting)   Pulse 82   Temp 97.8 F (36.6 C) (Oral)   Resp 20   Ht '5\' 4"'  (1.626 m)   Wt 136 lb 9.6 oz (62 kg)   SpO2 100%   BMI 23.45 kg/m    Wt Readings from Last 3 Encounters:  04/18/18 136 lb 9.6 oz (62 kg)  04/09/18 134 lb (60.8 kg)  04/04/18 134 lb (60.8 kg)

## 2018-04-18 NOTE — Telephone Encounter (Signed)
Called pt to let her know that per Dr.Gudena, pt may continue herceptin only if she wants to, until further molecular test results come back. Per pt, she would like to hold off on any further treatment until test molecular test completed, and resulted. Will make MD aware.

## 2018-04-19 NOTE — Progress Notes (Signed)
Radiation Oncology         [phone] ________________________________  Name: Heather [name] MRN: [id number]  Date: 04/18/2018  DOB: April 03, 1946  Post-treatment Note  CC: [name], MD  [name], MD  Diagnosis:   72 y.o. woman with ER positive, PR negative, HER-2 positive metastatic breast cancer with multifocal metastasis involving the spine, bony skeleton, lung, and brain.   Interval Since Last Radiation: 1 year   05/18/2017 - 05/31/2017:  The thoracic spine (T7-T9) was treated to 30 Gy in 10 fractions of 3 Gy.  6/27-7/18/18 SRS and palliative treatment: 1.  The rib 8th was treated to 30 Gy in 10 fractions 2.  Right cerebellar 6 mm target was treated using 3 Dynamic Conformal Arcs to a prescription dose of 20 Gy in one fraction  04/27/16-05/12/16: Left femur/ 30 Gy in 10 fractions  04/13/2015-05/19/2015 Postop:  55 Gy in 25 fractions of 2.2 Gy to the thoracic spine  10/25/12-12/11/12: 50.4 Gy to the right chest wall and regional lymph nodes in 28 fractions with a 10 Gy boost to the right chest wall in 1 session.  Narrative: Mrs. Hazelett is a well known patient to our service with a history of metastatic breast cancer originally treated in 2014 to the chest wall and regional lymph nodes. She developed recurrent disease within the thoracic spine and subsequently has undergone 3 surgeries, in addition to radiotherapy, with her most recent surgery being on 06/13/2016 where she had removal of thoracic hardware and replacement of pedicle screws with stabilization.  Of note previously placed screws between T3, T4 T6 and T7 were identified and dissection was carried out inferiorly to T8, T9-T10 and T11, T8 through T11 pedicle screw trajectories were placed bilaterally. She recovered well since treatment to her spine as she had been paralyzed in 2016, and with rigorous therapy has been able to make an impressive recovery. She continues on systemic therapy under the care of [name] and  also follows along in the brain and spine oncology conference. She was previously on Faslodex, Perjeta, and Herceptin systemic therapy since March of 2018, and had a repeat PET scan on 10/10/16 which revealed stable to smaller soft tissue nodules in the posterior thorax near the cervicothoracic junction and stable hypermetabolism in the previously treated right paraspinal disease at T8 but there was a new hypermetabolic 2.5 x 1.1 cm lesion in the posterior right 8th rib with an SUV of 8.9. She also mentioned having new onset headaches, dizziness, and an episode of visual change,so an MRI brain was performed 11/04/16 demonstrating a solitary contrast-enhancing lesion within the right cerebellum, measuring 6 x 2 mm and lying just beneath the right tentorial leaflet and concerning for a metastatic lesion.  This solitary lesion was confirmed with SRS protocol MRI brain on 11/16/16 with no new lesions noted. This lesion was treated with SRS on 11/23/16 which she tolerated very well. She also completed palliative radiotherapy to the right eight rib from 11/02/16 - 11/23/16 with significant improvement in her pain.   She had follow up imaging with MRI of the brain and thoracic spine on 03/15/17 which appeared to be overall stable except for some progressive nodular enhancing soft tissue masses in the subcutaneous tissues posteriorly at the level of prior surgery and felt compatible with metastatic disease. The decision at that time was to continue to monitor the RLL mass to assess response to chemotherapy and consider radiotherapy to that lesion only if no response or poor response to systemic  therapy.  The right cerebellar metastatic disease was no longer visualized and there were no new lesions in the brain.   She remained on Herceptin, Perjeta, Faslodex and Zometa for systemic disease under the care and direction of [name].  She developed increased pain in the T8 region and also had PET positive findings in this  region, as well as hypermetabolic right lower lobe mass concerning for progressive metastatic disease on follow up imaging 04/18/2017, despite her current systemic treatment. Her systemic therapy was switched to Kadcyla on 05/05/17 with discontinuation of Faslodex, Herceptin, and Perjeta.  She proceeded with palliative radiotherapy to 30 Gy in 10 fractions to the thoracic spine (T7-T9) from 05/18/2017 - 05/31/2017.  This treatment was tolerated very well.  She continued on the Kadcyla and Zometa, which she tolerated relatively well.  Repeat brain MRI performed 06/15/17 showed no new or progressive contrast enhancing lesions and no residual abnormality at the site of the previous treated right cerebellar lesion but Chest CT on 07/26/17 showed clear disease progression with interval increase in size of the multiple enhancing masses posteriorly overlying the spine, interval development of left axillary and mediastinal adenopathy, increase in the size of lytic lesions in T8-T9 and left ninth rib, as well as increased groundglass opacities right lower lobe lung. Therefore, the Kadcyla was discontinued after 3 cycles and she was switched to Palbociclib + Faslodex + trastuzumab + Pertuzumab, started 08/10/17. She has tolerated this treatment relatively well with the exception of extreme fatigue due to significant neutropenia.  MRI T-spine on 09/21/17 showed the known T12 lesion had slightly progressed by a few millimeters from 03/15/17 and there was a new lesion at L2 which was not present on lumbar MRI from 07/13/16. Otherwise, the treated disease at T8-T9 remained stable and there was decreased size of subcutaneous nodules over the upper thoracic spine.  Follow up systemic imaging on 12/13/17, showed stable appearance of the mixed lytic and sclerotic lesions involving L2 and T12 and no evidence of new osseous metastatic disease. Overall appearance on systemic imaging showed a good response to treatment with improved LAN and  decreased subcutaneous nodules in the upper thoracic region.  MRI brain on 01/13/18 showed continued interval increase in size the right parietal calvarium metastasis, measuring 10 x 6 x 6 mm with stable appearance in size of a right frontal calvarial metastasis and no parenchymal or dural metastases.    INTERVAL HISTORY:  She has had recent follow up imaging with MRI brain on 04/12/18 which shows no residual or progressive disease in the brain but one enlarging calvarial met at the base of the skull.  Her most recent PET scan from 04/03/2018 unfortunately showed significant disease progression despite current systemic therapy.  Her systemic chemotherapy was discontinued on 04/04/2018 due to disease progression and the current plan is to further investigate for molecular markers to see if there are other systemic treatment options available.  On review of systems, the patient reports that she continues doing well overall.  She continues with modest fatigue which is tolerable and not significantly interfering with her daily activities- improved since stopping systemic therapy.  She remains active.  She has also noticed improvement in the burning/tingling pain that radiates from the left aspect of the spine at the level of T8-9 and radiates underneath her breast since completing radiotherapy.  She has not had recent changes in visual or auditory acuity, tinnitus, increased imbalance, tremor or seizure activity. She denies recent headaches, sternal chest pain, shortness of  breath, cough, fevers, chills, night sweats, or unintended weight changes. She denies any bowel or bladder disturbances, and denies abdominal pain, nausea or vomiting. She denies any new musculoskeletal or joint aches or pains, new skin lesions or concerns.  A complete review of systems is obtained and is otherwise negative.  Past Medical History:  Past Medical History:  Diagnosis Date  . Anemia    hx of  . Anxiety    r/t updated surgery  .  Breast cancer ([place])    right  . Cataract    immature-not sure of which eye  . Complication of anesthesia    pt states she is sensitive to meds  . Dizziness    has been going on for 22month;medical MD aware  . Genetic testing 07/11/2016   Test Results: Normal; No pathogenic mutations detected  Genes Analyzed: 43 genes on [employer] Common Cancers panel (APC, ATM, AXIN2, BARD1, BMPR1A, BRCA1, BRCA2, BRIP1, CDH1, CDKN2A, CHEK2, DICER1, EPCAM, GREM1, HOXB13, KIT, MEN1, MLH1, MSH2, MSH6, MUTYH, NBN, NF1, PALB2, PDGFRA, PMS2, POLD1, POLE, PTEN, RAD50, RAD51C, RAD51D, SDHA, SDHB, SDHC, SDHD, SMAD4, SMARCA4, STK11, TP53, TSC1, TSC2, VHL).  .Marland KitchenGERD (gastroesophageal reflux disease)    Tums prn  . H. pylori infection   . H/O hiatal hernia   . Hemorrhoids   . History of UTI   . Hx of radiation therapy 10/25/12- 12/11/12   right chest wall/regional lymph nodes 5040 cGy, 28 sessions, right chest wall boost 1000 cGy 1 session  . Hx: UTI (urinary tract infection)   . Hyperlipidemia    but not on meds;diet and exercise controlled  . Hypertension    recently started Aldactone   . Insomnia    takes Melatoniin daily  . Metastasis to spinal column ([facility])    T5, Left  Femur cancer- radiation   . Neuropathy    "from back surgery"  . PONV (postoperative nausea and vomiting)    pt experienced hair loss, confusion and combative- 02/15/15  . Right knee pain   . Right shoulder pain   . Skin cancer    squamous, Nodule to back - "same cancer as breast."  . Urinary frequency    d/t taking Aldactone    Past Surgical History: Past Surgical History:  Procedure Laterality Date  . COLONOSCOPY    . cyst removed from left breast  1970  . DECOMPRESSIVE LUMBAR LAMINECTOMY LEVEL 4 N/A 02/15/2015   Procedure: DECOMPRESSION T5 AND T3-T7 STABALIZATION;  Surgeon: NConsuella Lose MD;  [facility];  Service: Neurosurgery;  Laterality: N/A;  . ESOPHAGOGASTRODUODENOSCOPY    . LAMINECTOMY N/A 03/27/2015    Procedure: Thoracic Four-Thoracic Six Laminectomy for tumor;  Surgeon: NConsuella Lose MD;  [facility];  Service: Neurosurgery;  Laterality: N/A;  T4-T6 Laminectomy  . left wrist surgery   2004   with plate  . MASTECTOMY MODIFIED RADICAL  05/10/2012   Procedure: MASTECTOMY MODIFIED RADICAL;  Surgeon: PMerrie Roof MD;  [facility]  Service: General;  Laterality: Right;  RIGHT MODIFIED RADICAL MASTECTOMY  . MASTECTOMY, RADICAL Right   . MINOR BREAST BIOPSY Left 05/16/2016   Procedure: EXCISION OF BACK NODULE;  Surgeon: PAutumn MessingIII, MD;  [place] [facility]  Service: General;  Laterality: Left;  . Nodule  back  05/17/2015  . PORTACATH PLACEMENT  05/10/2012   Procedure: INSERTION PORT-A-CATH;  Surgeon: PMerrie Roof MD;  [place] [facility]  Service: General;  Laterality: Left;  Social History:  Social History   Socioeconomic History  . Marital status: Married    Spouse name: Not on file  . Number of children: 2  . Years of education: Not on file  . Highest education level: Not on file  Occupational History  . Not on file  Social Needs  . Financial resource strain: Not on file  . Food insecurity:    Worry: Not on file    Inability: Not on file  . Transportation needs:    Medical: Not on file    Non-medical: Not on file  Tobacco Use  . Smoking status: Never Smoker  . Smokeless tobacco: Never Used  Substance and Sexual Activity  . Alcohol use: No  . Drug use: No  . Sexual activity: Yes    Birth control/protection: Post-menopausal  Lifestyle  . Physical activity:    Days per week: Not on file    Minutes per session: Not on file  . Stress: Not on file  Relationships  . Social connections:    Talks on phone: Not on file    Gets together: Not on file    Attends religious service: Not on file    Active member of club or organization: Not on file    Attends meetings of clubs or organizations: Not on file    Relationship status: Not on  file  . Intimate partner violence:    Fear of current or ex partner: Not on file    Emotionally abused: Not on file    Physically abused: Not on file    Forced sexual activity: Not on file  Other Topics Concern  . Not on file  Social History Narrative  . Not on file    Family History: Family History  Problem Relation Age of Onset  . Leukemia Mother 34       deceased 71  . Stroke Father   . Diabetes Mellitus II Father   . Prostate cancer Brother 10       currently 6  . Stomach cancer Maternal Grandmother 68       deceased 60  . Prostate cancer Brother 64       currently 24     ALLERGIES:  is allergic to other; acyclovir and related; chlorhexidine; zanaflex [tizanidine hcl]; codeine; keflex [cephalexin]; naprosyn [naproxen]; oxycodone; tramadol; and tussin [guaifenesin].  Meds: Current Outpatient Medications  Medication Sig Dispense Refill  . ALPRAZolam (XANAX) 0.25 MG tablet Take 1 tablet by mouth as needed.    Marland Kitchen b complex vitamins tablet Take 1 tablet by mouth daily.    . Calcium Carbonate Antacid (TUMS PO) Take 2 tablets by mouth 2 (two) times daily as needed (acid reflux).    . Cholecalciferol (VITAMIN D) 2000 UNITS tablet Take 2,000 Units by mouth daily.    Marland Kitchen gabapentin (NEURONTIN) 600 MG tablet Take 1 tablet (600 mg total) by mouth as directed. Take 1 tablet in the AM and afternoon and 1 1/2 tabs at bedtime (Patient taking differently: Take 600 mg by mouth 3 (three) times daily. Take 1 tablet in the AM and afternoon and 1 1/2 tabs at bedtime) 315 tablet 2  . lidocaine-prilocaine (EMLA) cream Apply to affected area once (Patient taking differently: Apply 1 application topically every 30 (thirty) days. Apply to port prior to infusions) 30 g 3  . OVER THE COUNTER MEDICATION Place 1 drop into both eyes 2 (two) times daily as needed (dry eyes). Over the counter lubricating eye drops    .  polyethylene glycol (MIRALAX / GLYCOLAX) packet Take 17 g by mouth daily as needed.    .  Probiotic Product (PROBIOTIC DAILY PO) Take 1 capsule by mouth daily.     Marland Kitchen spironolactone (ALDACTONE) 25 MG tablet Take 25 mg by mouth daily.    . Coenzyme Q10 (COQ10) 100 MG CAPS Take 100 mg by mouth as needed. Days that she takes pravastatin MWF.    Marland Kitchen fluticasone (FLONASE) 50 MCG/ACT nasal spray Place 1 spray into both nostrils daily as needed.    Marland Kitchen HYDROcodone-acetaminophen (NORCO/VICODIN) 5-325 MG tablet Take 1 tablet by mouth every 6 (six) hours as needed for moderate pain. (Patient not taking: Reported on 04/09/2018) 30 tablet 0  . omeprazole (PRILOSEC) 40 MG capsule TAKE 1 CAPSULE BY MOUTH ONCE DAILY FOR 30 DAYS  5  . tobramycin-dexamethasone (TOBRADEX) ophthalmic solution Place 1 drop into both eyes every 6 (six) hours. (Patient not taking: Reported on 04/09/2018) 5 mL 0   No current facility-administered medications for this encounter.    Facility-Administered Medications Ordered in Other Encounters  Medication Dose Route Frequency Provider Last Rate Last Dose  . sodium chloride 0.9 % injection 10 mL  10 mL Intravenous PRN [name], MD   10 mL at 04/26/17 6789    Physical Findings:  height is _0  (1.626 m) and weight is 136 lb 9.6 oz (62 kg). Her oral temperature is 97.8 F (36.6 C). Her blood pressure is 154/74 (abnormal) and her pulse is 82. Her respiration is 20 and oxygen saturation is 100%.  Pain Assessment Pain Score: 0-No pain/10 In general this is a well appearing Caucasian female in no acute distress. She's alert and oriented x4 and appropriate throughout the examination. Cardiopulmonary assessment is negative for acute distress and she exhibits normal effort. She appears grossly neurologically intact.  PERRLA.  EOMs are intact bilaterally. Speech is clear and well organized, without evidence of difficulty with word finding.  There are palpable subcutaneous nodules bilaterally along T3-T4 and bilaterally at T8-T9.  She is non-tender to palpate midline over T12 - L2.  She  has FROM of bilateral upper and lower extremities.  Sensation is intact to light touch and strength is 5/5 and equal in the upper and lower extremities.     Lab Findings: Lab Results  Component Value Date   WBC 2.7 (L) 04/04/2018   HGB 11.4 (L) 04/04/2018   HCT 33.7 (L) 04/04/2018   MCV 97.1 04/04/2018   PLT 184 04/04/2018     Radiographic Findings: Mr Jeri Cos FY Contrast  Result Date: 04/12/2018 CLINICAL DATA:  Metastatic breast cancer.  Follow-up SRS treatment. EXAM: MRI HEAD WITHOUT AND WITH CONTRAST TECHNIQUE: Multiplanar, multiecho pulse sequences of the brain and surrounding structures were obtained without and with intravenous contrast. CONTRAST:  66m MULTIHANCE GADOBENATE DIMEGLUMINE 529 MG/ML IV SOLN COMPARISON:  01/13/2018.  06/15/2017. FINDINGS: Brain: The brain itself continues to have a negative appearance without evidence of residual or recurrent metastatic disease. Brain diffusion imaging is normal. Few scattered punctate foci of T2 and FLAIR signal within the white matter are unchanged. No cortical abnormality. No intra-axial mass lesion, hemorrhage, hydrocephalus or extra-axial collection. Vascular: Major vessels at the base of the brain show flow. Skull and upper cervical spine: There are 3 calvarial metastatic lesions. Previously measured lesion in the right parietal calvarium measures 9-10 mm in diameter, axial image 118. Right frontal calvarial lesion measures 5 mm, axial image 109. Enlarging lesion in the skull base just anterior to the  right temporal tip measures 1 cm, axial image 48. Sinuses/Orbits: Clear/normal Other: None IMPRESSION: Little if any change of the right frontal and right parietal calvarial lesions. Enlarging skull lesion at the calvarial base just anterior to the right temporal tip, measuring 1 cm in diameter. Electronically Signed   By: Nelson Chimes M.D.   On: 04/12/2018 14:30   Nm Pet Image Restag (ps) Skull Base To Thigh  Result Date:  04/03/2018 CLINICAL DATA:  Subsequent treatment strategy for metastatic right breast cancer. EXAM: NUCLEAR MEDICINE PET SKULL BASE TO THIGH TECHNIQUE: 6.3 mCi F-18 FDG was injected intravenously. Full-ring PET imaging was performed from the skull vertex to knees after the radiotracer. CT data was obtained and used for attenuation correction and anatomic localization. Fasting blood glucose: 97 mg/dl COMPARISON:  Multiple exams, including 04/18/2017 and CT scan from 12/13/2017. FINDINGS: Mediastinal blood pool activity: SUV max 2.4 NECK: The technologist elected to include the entire head and not just the neck. There is a small right calvarial metastatic lesion, maximum SUV is difficult to measure due to volume averaging of adjacent brain, but there is felt to be some hypermetabolic activity. Incidental CT findings: none CHEST: Nodular lesions along the upper thoracic posterior chest wall are observed mostly along the subcutaneous tissues, including a 4.4 by 3.7 cm lesion on image 65/4 (formerly 2.5 by 2.0 cm) with maximum SUV 7.9 (formerly 8.0). A separate midline posterior lesion in the subcutaneous tissues measures 1.6 by 1.5 cm on image 68/4 (previously indistinct) and has a maximum SUV of 7.3. Additional more superficial subcutaneous nodularity is new and measures 1.4 by 1.1 cm on image 73/4, with maximum SUV 6.2. Low-grade activity in small left axillary lymph nodes. Radiation therapy related findings in the lungs. Airspace opacity posteriorly in the right lower lobe with associated mild nodularity with low-grade activity, maximum SUV 3.3, formerly 3.9. Incidental CT findings: Right Port-A-Cath tip: Cavoatrial junction. Atherosclerotic calcification of the aortic arch. ABDOMEN/PELVIS: Stable mild nodularity along the right adrenal gland, maximum SUV 4.0, formerly 2.5. The nodularity in this vicinity measures up to 0.9 cm in diameter. A 9 mm nodule eccentric to the left in the subcutaneous tissues of the pubis  previously measured 6 mm, and has a maximum SUV of 3.3, previously not metabolic. Incidental CT findings: Unremarkable SKELETON: Compared to prior PET-CT there is a new 1.5 cm right L2 vertebral body metastatic lesion with maximum SUV 10.2. There is also a 1.6 by 1.4 cm metastatic lesion eccentric to the left at the T12 vertebral level which is essentially new from the prior PET-CT, with maximum SUV 3.8. There is a small sclerotic lesion in this location before but without metabolic activity. The previous lower thoracic spine activity approximately at the T8-9 vertebral level eccentric to the right is no longer appreciable. There is a hypermetabolic lesion in the distal left femoral metaphysis, maximum SUV 3.7 common not readily appreciable on the prior exam. There is mildly accentuated activity in a sclerotic region of the right distal femoral metaphysis, maximum SUV 2.9 today and previously 2.2 in this region. This could be from red marrow or tumor infiltration. No significant residual activity in the right posterior eighth rib lesion. Mixed sclerosis and lucency in the right acromion measuring about 2.2 cm in diameter on image 62/4, maximum SUV 3.6, or as on the contralateral normal side the maximum SUV is 1.5. Incidental CT findings: None IMPRESSION: 1. Some of the bony lesions such as the T8-9 lesion and the right eighth rib  lesion have resolved in metabolic activity compared to the prior exam. However, there are multiple new bony lesions including calvarial metastatic lesions, a new right L2 hypermetabolic lesion, a new F09 lesion, a new lesion in the left distal femur and possibly the right distal femur, and a new lesion in the right acromion. Overall appearance is progressive. 2. Posterior upper thoracic wall chest lesions are progressive in general, increased in size and with new hypermetabolic nodularity in this region. 3. Areas of radiation therapy in the lung without definite residual malignancy. At the  right lung base there is some low-grade residual activity, less than prior, likely from radiation fibrosis/pneumonitis. 4. Stable nodularity at 9 mm in diameter of the right adrenal gland, but slightly increased in metabolic activity, maximum SUV 4.0. This merits surveillance. 5. A 9 mm soft tissue nodule eccentric to the left in the subcutaneous tissues of the pubis is mildly hypermetabolic. Technically nonspecific but merit surveillance. Electronically Signed   By: Van Clines M.D.   On: 04/03/2018 13:51    Impression/Plan: 60. 72 yo woman with ER positive breast cancer with a solitary brain metastasis, bony metastatses and concerning pulmonary nodule.  Regarding her metastatic brain disease, she appears to be clinically stable despite mild progression with one enlarging calvarial met at the base of the skull.  There was no residual or progressive parenchymal disease noted in the brain.  As she remains asymptomatic regarding the calvarial lesions and without evidence of new or progressive parenchymal lesions, as per recommendations from recent multidisciplinary brain and spine conference, we will continue to monitor her closely with serial brain MRI scans every 3 months and office follow-up thereafter to review results.  Regarding her systemic disease, she will continue in routine follow-up for disease management under the care and direction of [name].  We are hopeful that further molecular testing will reveal further systemic treatment options and that these lesions will respond well to treatment.   2. Spine metastases.  On recent systemic imaging with PET on 04/03/18, the lesions involving L2 and N23 are hypermtabolic but relatively unchanged as compared to appearance on CT in 12/2017 and no evidence of new osseous metastatic disease.  She is not currently symptomatic, so, as per consensus recommendation from multidisciplinary conference, we will continue with observation and will alternate  further spine imaging for surveillance with systemic imaging as ordered by [name].  She will be due for repeat thoracic imaging in late February 2020 so we will coordinate this with her upcoming brain imaging unless she has further systemic imaging prior to that time.    Nicholos Johns, PA-C

## 2018-04-19 NOTE — Progress Notes (Signed)
Brain and Spine Tumor Board Documentation  Heather [name] was presented by Cecil Cobbs, MD at Brain and Spine Tumor Board on 04/19/2018, which included representatives from neuro oncology, radiation oncology, surgical oncology, radiology, pathology, navigation, genetics.  Heather [name] was presented as a current patient with history of the following treatments:  .  Additionally, we reviewed previous medical and familial history, history of present illness, and recent lab results along with all available histopathologic and imaging studies. The tumor board considered available treatment options and made the following recommendations:  Active surveillance    Tumor board is a meeting of clinicians from various specialty areas who evaluate and discuss patients for whom a multidisciplinary approach is being considered. Final determinations in the plan of care are those of the provider(s). The responsibility for follow up of recommendations given during tumor board is that of the provider.   Today's extended care, comprehensive team conference, Heather [name] was not present for the discussion and was not examined.

## 2018-05-01 ENCOUNTER — Other Ambulatory Visit: Payer: Self-pay | Admitting: Radiation Therapy

## 2018-05-01 ENCOUNTER — Encounter (HOSPITAL_COMMUNITY): Payer: Self-pay | Admitting: Hematology and Oncology

## 2018-05-01 DIAGNOSIS — C7949 Secondary malignant neoplasm of other parts of nervous system: Secondary | ICD-10-CM

## 2018-05-01 DIAGNOSIS — C7931 Secondary malignant neoplasm of brain: Secondary | ICD-10-CM

## 2018-05-04 ENCOUNTER — Telehealth: Payer: Self-pay

## 2018-05-04 DIAGNOSIS — H10013 Acute follicular conjunctivitis, bilateral: Secondary | ICD-10-CM | POA: Diagnosis not present

## 2018-05-04 NOTE — Telephone Encounter (Signed)
Heather [name] in [place] left message to follow up with patient regarding upcoming appointments.   Nurse called patient.  Nurse encouraged patient to keep appointments at this time d/t pathology results being complete.  Nurse informed patient we would have [name] review results and call back on Monday with recommendations.  No further needs at this time.

## 2018-05-08 NOTE — Progress Notes (Signed)
Patient Care Team: [name], MD as PCP - General (Family Medicine)  DIAGNOSIS:    ICD-10-CM   1. Breast cancer metastasized to bone, right (HCC) C50.911    C79.51   2. Brain metastasis ([place]) C79.31   3. Primary cancer of upper outer quadrant of right female breast ([place]) C50.411     SUMMARY OF ONCOLOGIC HISTORY:   Primary cancer of upper outer quadrant of right female breast ([place])   03/08/2012 Initial Diagnosis    Right breast invasive ductal carcinoma ER positive PR negative HER-2 positive Ki-67 44%; another breast mass biopsied in the anterior part of the breast which was also positive for malignancy that was HER-2 negative    05/10/2012 Surgery    Right mastectomy and axillary lymph node dissection: Multifocal disease 5 cm, 1.7 cm, 1.6 cm, ER positive PR negative HER-2 positive Ki-67 44%, 3/7 lymph nodes positive    06/14/2012 - 06/12/2013 Chemotherapy    Adjuvant chemotherapy with [place] 6 followed by Herceptin maintenance    10/25/2012 - 12/11/2012 Radiation Therapy    Adjuvant radiation therapy    02/07/2013 -  Anti-estrogen oral therapy    Letrozole 2.5 mg daily    02/14/2015 Imaging    MRI spine: Large destructive T5 lesion with severe pathologic compression fracture and extensive epidural tumor, severe spinal stenosis with moderate cord compression, tumor involvement of T4    03/18/2015 PET scan    Residual enhancing soft tissue adjacent to the spinal cord. No evidence of metastatic disease. Nonspecific uptake the left nipple    03/27/2015 - 03/30/2015 Hospital Admission    T4-6 decompression for spinal cord compression (lower extremity paralysis)    04/10/2015 -  Chemotherapy    Palliative treatment with Herceptin every 3 weeks with letrozole 2.5 mg daily    04/13/2015 - 05/19/2015 Radiation Therapy    Palliative radiation treatment to the spine    09/01/2015 Imaging    CT chest abdomen pelvis: Pathologic fracture with posterior fusion at T5 no other evidence of  metastatic disease in the chest abdomen pelvis    12/23/2015 Imaging    CT CAP: new nonspecific 0.6 cm lymph node was stated mediastinum needs follow-up CT, innumerable tiny groundglass pulmonary nodules throughout both lungs unchanged, patchy consolidation from radiation, T5 fracture, no mets    04/15/2016 PET scan    Rt para-spinal mass mildy hypermetabolic SUV 5.9, lytic cortical lesion 4.8 cm lesion left prox femur suv 8.8 favor osseus met disease    04/28/2016 - 05/12/2016 Radiation Therapy    Palliative XRT Left femur    05/24/2016 Procedure    Soft tissue mass biopsy back: Metastatic breast cancer ER 80%, PR 0%, Ki-67 50%, HER-2 positive ratio 2.25    06/09/2016 - 03/29/2017 Anti-estrogen oral therapy    Faslodex with Herceptin and Perjeta every 4 weeks    06/13/2016 - 06/15/2016 Hospital Admission    uncomplicated revision of previous thoracic instrumentation.    10/10/2016 PET scan    Interval development of new hypermetabolic 2.5 x 1.1 cm lesion posterior right eighth rib consistent with metastatic disease, stable right paraspinal lesion at T8, clustered soft tissue nodules in the posterior midline back near cervicothoracic junction smaller than before     11/03/2016 - 11/07/2016 Radiation Therapy    Palliative radiation to right eighth rib    11/04/2016 Imaging    Solitary contrast-enhancing lesion in the right cerebellum 6 x 2 mm concerning for metastatic lesion    11/24/2016 - 11/24/2016 Radiation Therapy  SRS to the brain lesion    04/18/2017 PET scan    New ill-defined rounded airspace opacity in posterior right lower lobe. Differential diagnosis includes malignancy and infectious/inflammatory process.Increased hypermetabolic lytic lesion in T8 vertebral body, consistent with bone metastasis.  Increased hypermetabolic posterior upper chest wall subcutaneous nodules, highly suspicious metastatic disease. Further improvement in right posterior eighth rib metastasis.    05/04/2017 -  07/27/2017 Chemotherapy    Kadcyla every 3 weeks palliative chemotherapy stopped for progression    07/26/2017 Imaging    Interval increase in size of the multiple enhancing masses posteriorly overlying the spine, interval development of left axillary and mediastinal adenopathy, increase in the size of lytic lesions in T8-T9 and left ninth rib, increased groundglass opacities right lower lobe lung    07/27/2017 -  Chemotherapy    Palbociclib + Faslodex + trastuzumab + Pertuzumab      04/03/2018 PET scan    PET CT scan showed progression of disease not only in the bones but also in the subcutaneous nodules.     CHIEF COMPLIANT: Follow-up to discuss molecular test results and further metastatic breast cancer treatment  INTERVAL HISTORY: Heather [name] is a 72 y.o. with above-mentioned history of metastatic breast cancer. Her last Cadcyla treatment was 04/04/18, which was stopped due to disease progression. She presents to the clinic today with her husband and daughter in law to discuss further treatment and molecular testing results. Her molecular test results show the cancer is androgen receptor positive and PI3 kinase positive. She notes her fatigue has subsided since stopping treatment. She notes she can feel metastases growing in her upper back and see evidence of them under the skin. She noted concern about treatment that could raise blood sugar levels because her dad was diabetic. She notes her brothers have a history of prostate cancer and expressed concern about genetics and the risk for family members.   REVIEW OF SYSTEMS:   Constitutional: Denies fevers, chills or abnormal weight loss Eyes: Denies blurriness of vision Ears, nose, mouth, throat, and face: Denies mucositis or sore throat Respiratory: Denies cough, dyspnea or wheezes Cardiovascular: Denies palpitation, chest discomfort Gastrointestinal:  Denies nausea, heartburn or change in bowel habits Skin: Cutaneous lesions on the  back related to malignancy Lymphatics: Denies new lymphadenopathy or easy bruising Neurological: Denies numbness, tingling or new weaknesses Behavioral/Psych: Mood is stable, no new changes  Extremities: No lower extremity edema Breast: denies any pain or lumps or nodules in either breasts All other systems were reviewed with the patient and are negative.  I have reviewed the past medical history, past surgical history, social history and family history with the patient and they are unchanged from previous note.  ALLERGIES:  is allergic to other; acyclovir and related; chlorhexidine; zanaflex [tizanidine hcl]; codeine; keflex [cephalexin]; naprosyn [naproxen]; oxycodone; tramadol; and tussin [guaifenesin].  MEDICATIONS:  Current Outpatient Medications  Medication Sig Dispense Refill  . ALPRAZolam (XANAX) 0.25 MG tablet Take 1 tablet by mouth as needed.    Marland Kitchen b complex vitamins tablet Take 1 tablet by mouth daily.    . Calcium Carbonate Antacid (TUMS PO) Take 2 tablets by mouth 2 (two) times daily as needed (acid reflux).    . Cholecalciferol (VITAMIN D) 2000 UNITS tablet Take 2,000 Units by mouth daily.    . Coenzyme Q10 (COQ10) 100 MG CAPS Take 100 mg by mouth as needed. Days that she takes pravastatin MWF.    Marland Kitchen fluticasone (FLONASE) 50 MCG/ACT nasal spray Place 1  spray into both nostrils daily as needed.    . gabapentin (NEURONTIN) 600 MG tablet Take 1 tablet (600 mg total) by mouth as directed. Take 1 tablet in the AM and afternoon and 1 1/2 tabs at bedtime (Patient taking differently: Take 600 mg by mouth 3 (three) times daily. Take 1 tablet in the AM and afternoon and 1 1/2 tabs at bedtime) 315 tablet 2  . HYDROcodone-acetaminophen (NORCO/VICODIN) 5-325 MG tablet Take 1 tablet by mouth every 6 (six) hours as needed for moderate pain. (Patient not taking: Reported on 04/09/2018) 30 tablet 0  . lidocaine-prilocaine (EMLA) cream Apply to affected area once (Patient taking differently: Apply 1  application topically every 30 (thirty) days. Apply to port prior to infusions) 30 g 3  . omeprazole (PRILOSEC) 40 MG capsule TAKE 1 CAPSULE BY MOUTH ONCE DAILY FOR 30 DAYS  5  . OVER THE COUNTER MEDICATION Place 1 drop into both eyes 2 (two) times daily as needed (dry eyes). Over the counter lubricating eye drops    . polyethylene glycol (MIRALAX / GLYCOLAX) packet Take 17 g by mouth daily as needed.    . Probiotic Product (PROBIOTIC DAILY PO) Take 1 capsule by mouth daily.     Marland Kitchen spironolactone (ALDACTONE) 25 MG tablet Take 25 mg by mouth daily.    Marland Kitchen tobramycin-dexamethasone (TOBRADEX) ophthalmic solution Place 1 drop into both eyes every 6 (six) hours. (Patient not taking: Reported on 04/09/2018) 5 mL 0   No current facility-administered medications for this visit.    Facility-Administered Medications Ordered in Other Visits  Medication Dose Route Frequency Provider Last Rate Last Dose  . sodium chloride 0.9 % injection 10 mL  10 mL Intravenous PRN [name], MD   10 mL at 04/26/17 0828    PHYSICAL EXAMINATION: ECOG PERFORMANCE STATUS: 1 - Symptomatic but completely ambulatory  Vitals:   05/10/18 0926  BP: (!) 162/76  Pulse: 81  Resp: 16  Temp: (!) 97.4 F (36.3 C)  SpO2: 100%   Filed Weights   05/10/18 0926  Weight: 135 lb 14.4 oz (61.6 kg)    GENERAL:alert, no distress and comfortable SKIN: skin color, texture, turgor are normal, no rashes or significant lesions EYES: normal, Conjunctiva are pink and non-injected, sclera clear OROPHARYNX:no exudate, no erythema and lips, buccal mucosa, and tongue normal  NECK: supple, thyroid normal size, non-tender, without nodularity LYMPH:  no palpable lymphadenopathy in the cervical, axillary or inguinal LUNGS: clear to auscultation and percussion with normal breathing effort HEART: regular rate & rhythm and no murmurs and no lower extremity edema ABDOMEN:abdomen soft, non-tender and normal bowel sounds MUSCULOSKELETAL:no cyanosis  of digits and no clubbing  NEURO: alert & oriented x 3 with fluent speech, no focal motor/sensory deficits EXTREMITIES: No lower extremity edema    LABORATORY DATA:  I have reviewed the data as listed CMP Latest Ref Rng & Units 04/04/2018 03/01/2018 01/24/2018  Glucose 70 - 99 mg/dL 121(H) 103(H) 100(H)  BUN 8 - 23 mg/dL '14 13 14  ' Creatinine 0.44 - 1.00 mg/dL 0.83 0.84 0.86  Sodium 135 - 145 mmol/L 139 135 139  Potassium 3.5 - 5.1 mmol/L 3.8 4.0 3.9  Chloride 98 - 111 mmol/L 103 96(L) 102  CO2 22 - 32 mmol/L '26 28 27  ' Calcium 8.9 - 10.3 mg/dL 10.2 10.7(H) 10.1  Total Protein 6.5 - 8.1 g/dL 7.2 7.6 7.4  Total Bilirubin 0.3 - 1.2 mg/dL 0.5 0.4 0.6  Alkaline Phos 38 - 126 U/L 66 81 73  AST 15 - 41 U/L 31 42(H) 29  ALT 0 - 44 U/L 16 33 16    Lab Results  Component Value Date   WBC 2.7 (L) 04/04/2018   HGB 11.4 (L) 04/04/2018   HCT 33.7 (L) 04/04/2018   MCV 97.1 04/04/2018   PLT 184 04/04/2018   NEUTROABS 1.8 04/04/2018    ASSESSMENT & PLAN:  Primary cancer of upper outer quadrant of right female breast ([place]) Right breast invasive ductal carcinoma ER positive PR negative HER-2 positive Ki-67 44% multifocal disease 3/7 lymph nodes positive T2 N1 M0 stage IIB status post adjuvant chemotherapy with TCH followed by Herceptin maintenance, adjuvant radiation therapy and Letrozole. MRI 02/14/2015: T5 large destructive lesion with pathologic compression fracture and extensive epidural tumor, involvement of T4, excision metastatic carcinoma ER 60%, PR 0%, HER-2 positive ratio 2.71 average copy #7.45 03/27/2015: T4-6 decompression surgery for spinal cord compression PET-CT 12/10/17Rt para-spinal mass mildy hypermetabolic SUV 5.9, lytic cortical lesion 4.8 cm lesion left prox femur suv 8.8 favor osseus met disease Echo 04/12/2017 shows well preserved ejection fraction of 55-60%   Treatment summary: 1. Resumed anastrozole 05/21/2015, but it has caused significant hair loss so we switched  her to exemestane 25 mg once daily 07/23/2015 2. Herceptin every 3 week started 04/10/2015 3. Bone metastases: Zometa every 3 months-next duelate May/early June 4. Radiation therapy to the spine (Dr. [name]) 04/13/2015 to 05/19/2015 5. Radiation therapy to left femur 04/28/2016 to 05/12/2016  6. SRS to the brain lesion 11/24/2016 7.Kadcyla started 05/05/2017 discontinued 07/27/2017 8.Palbociclib +Faslodex +trastuzumab+ Pertuzumabstarted 08/10/2017, stopped 04/04/2018 -------------------------------------------------------------------------------------------------------------------------- PET CT scan 04/03/2018: Multiple new bone lesions including skull, right L2, T12, left distal femur, right distal femur, right acromium.  Posterior upper chest wall lesions increase in size and nodularity.  Worsening cutaneous metastases: I recommend palliative radiation therapy while we get the following treatment approved and started. She will continue with Herceptin Perjeta while she is getting palliative radiation.  We will plan to give the treatment this Saturday.  Because of this evidence of progression, I recommended switching her treatment to [facility] which is a novel antibody drug conjugate between trastuzumab and SN 32.  Most recent clinical trial destiny 01 revealed a 60% response rate and progression free survival of up to 17 months.  Major toxicities to be watchful of include interstitial lung disease, cytopenias, LV dysfunction. The most common adverse reactions (frequency ?20%) were nausea (79%), fatigue (59%), vomiting (47%), alopecia (46%), constipation (35%), decreased appetite (32%), anemia (31%), neutropenia (29%), diarrhea (29%), leukopenia (22%), cough (20%), and thrombocytopenia (20%).  Treatment plan:Fam-trastuzumab-Durextecan q 3 weeks  Return to clinic to start this new treatment as soon as it becomes available     No orders of the defined types were placed in this  encounter.  The patient has a good understanding of the overall plan. she agrees with it. she will call with any problems that may develop before the next visit here.  [name], MD 05/10/2018   I, Cloyde Reams Dorshimer, am acting as scribe for [name], MD.  I have reviewed the above documentation for accuracy and completeness, and I agree with the above.

## 2018-05-10 ENCOUNTER — Encounter: Payer: Self-pay | Admitting: Urology

## 2018-05-10 ENCOUNTER — Ambulatory Visit
Admission: RE | Admit: 2018-05-10 | Discharge: 2018-05-10 | Disposition: A | Discharge status: Home / Self Care | Payer: Medicare Other | Source: Ambulatory Visit | Attending: Radiation Oncology | Admitting: Radiation Oncology

## 2018-05-10 ENCOUNTER — Inpatient Hospital Stay: Payer: Medicare Other | Attending: Hematology and Oncology | Admitting: Hematology and Oncology

## 2018-05-10 ENCOUNTER — Other Ambulatory Visit: Payer: Medicare Other

## 2018-05-10 ENCOUNTER — Ambulatory Visit: Payer: Medicare Other

## 2018-05-10 ENCOUNTER — Telehealth: Payer: Self-pay | Admitting: Hematology and Oncology

## 2018-05-10 VITALS — BP 162/76 | HR 81 | Temp 97.4°F | Resp 16 | Ht 64.0 in | Wt 135.9 lb

## 2018-05-10 DIAGNOSIS — Z170 Estrogen receptor positive status [ER+]: Secondary | ICD-10-CM

## 2018-05-10 DIAGNOSIS — C792 Secondary malignant neoplasm of skin: Secondary | ICD-10-CM

## 2018-05-10 DIAGNOSIS — Z79811 Long term (current) use of aromatase inhibitors: Secondary | ICD-10-CM

## 2018-05-10 DIAGNOSIS — Z9011 Acquired absence of right breast and nipple: Secondary | ICD-10-CM

## 2018-05-10 DIAGNOSIS — Z510 Encounter for antineoplastic radiation therapy: Secondary | ICD-10-CM | POA: Diagnosis not present

## 2018-05-10 DIAGNOSIS — Z9221 Personal history of antineoplastic chemotherapy: Secondary | ICD-10-CM

## 2018-05-10 DIAGNOSIS — C7951 Secondary malignant neoplasm of bone: Secondary | ICD-10-CM

## 2018-05-10 DIAGNOSIS — C50411 Malignant neoplasm of upper-outer quadrant of right female breast: Secondary | ICD-10-CM | POA: Insufficient documentation

## 2018-05-10 DIAGNOSIS — C7931 Secondary malignant neoplasm of brain: Secondary | ICD-10-CM | POA: Diagnosis not present

## 2018-05-10 DIAGNOSIS — Z923 Personal history of irradiation: Secondary | ICD-10-CM | POA: Diagnosis not present

## 2018-05-10 DIAGNOSIS — Z79899 Other long term (current) drug therapy: Secondary | ICD-10-CM

## 2018-05-10 DIAGNOSIS — R110 Nausea: Secondary | ICD-10-CM | POA: Insufficient documentation

## 2018-05-10 DIAGNOSIS — D492 Neoplasm of unspecified behavior of bone, soft tissue, and skin: Secondary | ICD-10-CM

## 2018-05-10 DIAGNOSIS — R5383 Other fatigue: Secondary | ICD-10-CM | POA: Diagnosis not present

## 2018-05-10 DIAGNOSIS — C50911 Malignant neoplasm of unspecified site of right female breast: Secondary | ICD-10-CM

## 2018-05-10 NOTE — Telephone Encounter (Signed)
Called regarding 1/4

## 2018-05-10 NOTE — Progress Notes (Signed)
  Radiation Oncology         [phone] ________________________________  Name: Heather [name] MRN: [id number]  Date: 05/10/2018  DOB: 02-01-1946  SIMULATION AND TREATMENT PLANNING NOTE    ICD-10-CM   1. Thoracic spine tumor D49.2   2. Primary cancer of upper outer quadrant of right female breast ([place]) C50.411     DIAGNOSIS:  73 yo woman with subcutaneous enlarging metastases along the right posterior chest wall   NARRATIVE:  The patient was brought to the [facility].  Identity was confirmed.  All relevant records and images related to the planned course of therapy were reviewed.  The patient freely provided informed written consent to proceed with treatment after reviewing the details related to the planned course of therapy. The consent form was witnessed and verified by the simulation staff.  Then, the patient was set-up in a stable reproducible  supine position for radiation therapy.  CT images were obtained.  Surface markings were placed.  The CT images were loaded into the planning software.  Then the target and avoidance structures were contoured.  Treatment planning then occurred.  The radiation prescription was entered and confirmed.  Then, I designed and supervised the construction of a total of 5 medically necessary complex treatment devices or more in the form of Multi-Leaf Collimators to shield the spinal cord and lungs.  I have requested : 3D Simulation  I have requested a DVH of the following structures: spinal cord, left lung, right lung and target.   SPECIAL TREATMENT PROCEDURE:  The planned course of therapy using radiation constitutes a special treatment procedure. Special care is required in the management of this patient for the following reasons. This treatment constitutes a Special Treatment Procedure for the following reason: [ Retreatment in a previously radiated area requiring careful monitoring of increased risk of toxicity due to overlap of previous  treatment..  The special nature of the planned course of radiotherapy will require increased physician supervision and oversight to ensure patient's safety with optimal treatment outcomes.   PLAN:  The patient will receive 30 Gy in 10 fraction.  ________________________________  Sheral Apley [name], M.D.

## 2018-05-10 NOTE — Assessment & Plan Note (Signed)
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 MRI 02/14/2015: T5 large destructive lesion with pathologic compression fracture and extensive epidural tumor, involvement of T4, excision metastatic carcinoma ER 60%, PR 0%, HER-2 positive ratio 2.71 average copy #7.45 03/27/2015: T4-6 decompression surgery for spinal cord compression PET-CT 12/10/17Rt para-spinal mass mildy hypermetabolic SUV 5.9, lytic cortical lesion 4.8 cm lesion left prox femur suv 8.8 favor osseus met disease Echo 04/12/2017 shows well preserved ejection fraction of 55-60%   Treatment summary: 1. Resumed anastrozole 05/21/2015, but it has caused significant hair loss so we switched her to exemestane 25 mg once daily 07/23/2015 2. Herceptin every 3 week started 04/10/2015 3. Bone metastases: Zometa every 3 months-next duelate May/early June 4. Radiation therapy to the spine (Dr. [name]) 04/13/2015 to 05/19/2015 5. Radiation therapy to left femur 04/28/2016 to 05/12/2016  6. SRS to the brain lesion 11/24/2016 7.Kadcyla started 05/05/2017 discontinued 07/27/2017 8.Palbociclib +Faslodex +trastuzumab+ Pertuzumabstarted 08/10/2017, stopped 04/04/2018 -------------------------------------------------------------------------------------------------------------------------- PET CT scan 04/03/2018: Multiple new bone lesions including skull, right L2, T12, left distal femur, right distal femur, right acromium.  Posterior upper chest wall lesions increase in size and nodularity.  Because of this evidence of progression, I recommended switching her treatment to [facility] which is a novel antibody drug conjugate between trastuzumab and SN 32.  Most recent clinical trial destiny 01 revealed a 60% response rate and  progression free survival of up to 17 months.  Major toxicities to be watchful of include interstitial lung disease, cytopenias, LV dysfunction. The most common adverse reactions (frequency ?20%) were nausea (79%), fatigue (59%), vomiting (47%), alopecia (46%), constipation (35%), decreased appetite (32%), anemia (31%), neutropenia (29%), diarrhea (29%), leukopenia (22%), cough (20%), and thrombocytopenia (20%).  Treatment plan:Fam-trastuzumab-Durextecan q 3 weeks  Return to clinic to start this new treatment as soon as it becomes available

## 2018-05-11 DIAGNOSIS — C7951 Secondary malignant neoplasm of bone: Secondary | ICD-10-CM | POA: Diagnosis not present

## 2018-05-11 DIAGNOSIS — Z510 Encounter for antineoplastic radiation therapy: Secondary | ICD-10-CM | POA: Diagnosis not present

## 2018-05-11 DIAGNOSIS — D492 Neoplasm of unspecified behavior of bone, soft tissue, and skin: Secondary | ICD-10-CM | POA: Diagnosis not present

## 2018-05-11 DIAGNOSIS — C50411 Malignant neoplasm of upper-outer quadrant of right female breast: Secondary | ICD-10-CM | POA: Diagnosis not present

## 2018-05-11 DIAGNOSIS — C792 Secondary malignant neoplasm of skin: Secondary | ICD-10-CM | POA: Diagnosis not present

## 2018-05-11 NOTE — Progress Notes (Addendum)
Diagnosis:  73 y.o. woman with ER positive, PR negative, HER-2 positive metastatic breast cancer with multifocal metastasis involving the spine, bony skeleton, lung, and brain, now with rapid progression of painful subcutaneous lesions in the upper posterior chest wall.   We were contacted by [name] for consideration of additional palliative radiotherapy to the progressive subcutaneous lesions in the right upper posterior chest wall.  The patient has previously received radiation to the thoracic region and Dr. [name] has reviewed her most recent PET imaging as well as prior history and feels that she can safely undergo additional radiotherapy to the site of concern currently.     Assessment/Plan:  73 y.o. woman with ER positive, PR negative, HER-2 positive metastatic breast cancer with multifocal metastasis involving the spine, bony skeleton, lung, and brain, now with rapid progression of painful subcutaneous lesions in the upper posterior chest wall.  [name] is planning to switch her treatment to Fam-trastuzumab-Durextecan q 3 weeks as soon as it is available but this may be several weeks.  In the interim, we will have the patient come down for CT SIM/treatment planning later this afternoon in anticipation of beginning a 10 day course of palliative radiotherapy to the upper posterior chest wall lesions with plan to start treatment Monday 05/14/18.    Nicholos Johns, MMS, PA-C [place] at [place]: [phone]  Fax: [phone]

## 2018-05-12 ENCOUNTER — Inpatient Hospital Stay: Payer: Medicare Other

## 2018-05-12 VITALS — BP 114/61 | HR 72 | Temp 97.8°F | Resp 16

## 2018-05-12 DIAGNOSIS — Z170 Estrogen receptor positive status [ER+]: Secondary | ICD-10-CM | POA: Diagnosis not present

## 2018-05-12 DIAGNOSIS — C792 Secondary malignant neoplasm of skin: Secondary | ICD-10-CM | POA: Diagnosis not present

## 2018-05-12 DIAGNOSIS — C50411 Malignant neoplasm of upper-outer quadrant of right female breast: Secondary | ICD-10-CM | POA: Diagnosis not present

## 2018-05-12 DIAGNOSIS — R110 Nausea: Secondary | ICD-10-CM | POA: Diagnosis not present

## 2018-05-12 DIAGNOSIS — C7951 Secondary malignant neoplasm of bone: Secondary | ICD-10-CM | POA: Diagnosis not present

## 2018-05-12 DIAGNOSIS — C7931 Secondary malignant neoplasm of brain: Secondary | ICD-10-CM | POA: Diagnosis not present

## 2018-05-12 MED ORDER — ACETAMINOPHEN 325 MG PO TABS
ORAL_TABLET | ORAL | Status: AC
  Filled 2018-05-12: qty 2

## 2018-05-12 MED ORDER — SODIUM CHLORIDE 0.9 % IV SOLN
Freq: Once | INTRAVENOUS | Status: AC
  Administered 2018-05-12: 09:00:00 via INTRAVENOUS
  Filled 2018-05-12: qty 250

## 2018-05-12 MED ORDER — SODIUM CHLORIDE 0.9% FLUSH
10.0000 mL | INTRAVENOUS | Status: DC | PRN
  Administered 2018-05-12: 10 mL
  Filled 2018-05-12: qty 10

## 2018-05-12 MED ORDER — SODIUM CHLORIDE 0.9 % IV SOLN
420.0000 mg | Freq: Once | INTRAVENOUS | Status: AC
  Administered 2018-05-12: 420 mg via INTRAVENOUS
  Filled 2018-05-12: qty 14

## 2018-05-12 MED ORDER — HEPARIN SOD (PORK) LOCK FLUSH 100 UNIT/ML IV SOLN
500.0000 [IU] | Freq: Once | INTRAVENOUS | Status: AC | PRN
  Administered 2018-05-12: 500 [IU]
  Filled 2018-05-12: qty 5

## 2018-05-12 MED ORDER — CYANOCOBALAMIN 1000 MCG/ML IJ SOLN
1000.0000 ug | Freq: Once | INTRAMUSCULAR | Status: AC
  Administered 2018-05-12: 1000 ug via INTRAMUSCULAR

## 2018-05-12 MED ORDER — ZOLEDRONIC ACID 4 MG/100ML IV SOLN
4.0000 mg | Freq: Once | INTRAVENOUS | Status: AC
  Administered 2018-05-12: 4 mg via INTRAVENOUS
  Filled 2018-05-12: qty 100

## 2018-05-12 MED ORDER — ACETAMINOPHEN 325 MG PO TABS
650.0000 mg | ORAL_TABLET | Freq: Once | ORAL | Status: AC
  Administered 2018-05-12: 650 mg via ORAL

## 2018-05-12 MED ORDER — CYANOCOBALAMIN 1000 MCG/ML IJ SOLN
INTRAMUSCULAR | Status: AC
  Filled 2018-05-12: qty 1

## 2018-05-12 MED ORDER — TRASTUZUMAB CHEMO 150 MG IV SOLR
6.0000 mg/kg | Freq: Once | INTRAVENOUS | Status: AC
  Administered 2018-05-12: 357 mg via INTRAVENOUS
  Filled 2018-05-12: qty 17

## 2018-05-12 NOTE — Patient Instructions (Signed)
[facility] Discharge Instructions for Patients Receiving Chemotherapy  Today you received the following chemotherapy agents:  Herceptin, & Perjeta  To help prevent nausea and vomiting after your treatment, we encourage you to take your nausea medication as needed.   If you develop nausea and vomiting that is not controlled by your nausea medication, call the clinic.   BELOW ARE SYMPTOMS THAT SHOULD BE REPORTED IMMEDIATELY:  *FEVER GREATER THAN 100.5 F  *CHILLS WITH OR WITHOUT FEVER  NAUSEA AND VOMITING THAT IS NOT CONTROLLED WITH YOUR NAUSEA MEDICATION  *UNUSUAL SHORTNESS OF BREATH  *UNUSUAL BRUISING OR BLEEDING  TENDERNESS IN MOUTH AND THROAT WITH OR WITHOUT PRESENCE OF ULCERS  *URINARY PROBLEMS  *BOWEL PROBLEMS  UNUSUAL RASH Items with * indicate a potential emergency and should be followed up as soon as possible.  Feel free to call the clinic should you have any questions or concerns. The clinic phone number is [phone].  Please show the [facility] at check-in to the Emergency Department and triage nurse.

## 2018-05-14 ENCOUNTER — Ambulatory Visit
Admission: RE | Admit: 2018-05-14 | Discharge: 2018-05-14 | Disposition: A | Discharge status: Home / Self Care | Payer: Medicare Other | Source: Ambulatory Visit | Attending: Radiation Oncology | Admitting: Radiation Oncology

## 2018-05-14 DIAGNOSIS — Z510 Encounter for antineoplastic radiation therapy: Secondary | ICD-10-CM | POA: Diagnosis not present

## 2018-05-14 DIAGNOSIS — D492 Neoplasm of unspecified behavior of bone, soft tissue, and skin: Secondary | ICD-10-CM | POA: Diagnosis not present

## 2018-05-14 DIAGNOSIS — C7951 Secondary malignant neoplasm of bone: Secondary | ICD-10-CM | POA: Diagnosis not present

## 2018-05-14 DIAGNOSIS — C792 Secondary malignant neoplasm of skin: Secondary | ICD-10-CM | POA: Diagnosis not present

## 2018-05-14 DIAGNOSIS — C50411 Malignant neoplasm of upper-outer quadrant of right female breast: Secondary | ICD-10-CM | POA: Diagnosis not present

## 2018-05-15 ENCOUNTER — Ambulatory Visit
Admission: RE | Admit: 2018-05-15 | Discharge: 2018-05-15 | Disposition: A | Discharge status: Home / Self Care | Payer: Medicare Other | Source: Ambulatory Visit | Attending: Radiation Oncology | Admitting: Radiation Oncology

## 2018-05-15 DIAGNOSIS — C792 Secondary malignant neoplasm of skin: Secondary | ICD-10-CM | POA: Diagnosis not present

## 2018-05-15 DIAGNOSIS — C50411 Malignant neoplasm of upper-outer quadrant of right female breast: Secondary | ICD-10-CM | POA: Diagnosis not present

## 2018-05-15 DIAGNOSIS — D492 Neoplasm of unspecified behavior of bone, soft tissue, and skin: Secondary | ICD-10-CM | POA: Diagnosis not present

## 2018-05-15 DIAGNOSIS — C7951 Secondary malignant neoplasm of bone: Secondary | ICD-10-CM | POA: Diagnosis not present

## 2018-05-15 DIAGNOSIS — Z510 Encounter for antineoplastic radiation therapy: Secondary | ICD-10-CM | POA: Diagnosis not present

## 2018-05-16 ENCOUNTER — Ambulatory Visit
Admission: RE | Admit: 2018-05-16 | Discharge: 2018-05-16 | Disposition: A | Discharge status: Home / Self Care | Payer: Medicare Other | Source: Ambulatory Visit | Attending: Radiation Oncology | Admitting: Radiation Oncology

## 2018-05-16 DIAGNOSIS — Z510 Encounter for antineoplastic radiation therapy: Secondary | ICD-10-CM | POA: Diagnosis not present

## 2018-05-16 DIAGNOSIS — C792 Secondary malignant neoplasm of skin: Secondary | ICD-10-CM | POA: Diagnosis not present

## 2018-05-16 DIAGNOSIS — D492 Neoplasm of unspecified behavior of bone, soft tissue, and skin: Secondary | ICD-10-CM | POA: Diagnosis not present

## 2018-05-16 DIAGNOSIS — C50411 Malignant neoplasm of upper-outer quadrant of right female breast: Secondary | ICD-10-CM | POA: Diagnosis not present

## 2018-05-16 DIAGNOSIS — C7951 Secondary malignant neoplasm of bone: Secondary | ICD-10-CM | POA: Diagnosis not present

## 2018-05-17 ENCOUNTER — Ambulatory Visit
Admission: RE | Admit: 2018-05-17 | Discharge: 2018-05-17 | Disposition: A | Discharge status: Home / Self Care | Payer: Medicare Other | Source: Ambulatory Visit | Attending: Radiation Oncology | Admitting: Radiation Oncology

## 2018-05-17 DIAGNOSIS — C7951 Secondary malignant neoplasm of bone: Secondary | ICD-10-CM | POA: Diagnosis not present

## 2018-05-17 DIAGNOSIS — C792 Secondary malignant neoplasm of skin: Secondary | ICD-10-CM | POA: Diagnosis not present

## 2018-05-17 DIAGNOSIS — Z510 Encounter for antineoplastic radiation therapy: Secondary | ICD-10-CM | POA: Diagnosis not present

## 2018-05-17 DIAGNOSIS — C50411 Malignant neoplasm of upper-outer quadrant of right female breast: Secondary | ICD-10-CM | POA: Diagnosis not present

## 2018-05-17 DIAGNOSIS — D492 Neoplasm of unspecified behavior of bone, soft tissue, and skin: Secondary | ICD-10-CM | POA: Diagnosis not present

## 2018-05-18 ENCOUNTER — Ambulatory Visit
Admission: RE | Admit: 2018-05-18 | Discharge: 2018-05-18 | Disposition: A | Discharge status: Home / Self Care | Payer: Medicare Other | Source: Ambulatory Visit | Attending: Radiation Oncology | Admitting: Radiation Oncology

## 2018-05-18 DIAGNOSIS — C7951 Secondary malignant neoplasm of bone: Secondary | ICD-10-CM | POA: Diagnosis not present

## 2018-05-18 DIAGNOSIS — C792 Secondary malignant neoplasm of skin: Secondary | ICD-10-CM | POA: Diagnosis not present

## 2018-05-18 DIAGNOSIS — D492 Neoplasm of unspecified behavior of bone, soft tissue, and skin: Secondary | ICD-10-CM | POA: Diagnosis not present

## 2018-05-18 DIAGNOSIS — Z510 Encounter for antineoplastic radiation therapy: Secondary | ICD-10-CM | POA: Diagnosis not present

## 2018-05-18 DIAGNOSIS — C50411 Malignant neoplasm of upper-outer quadrant of right female breast: Secondary | ICD-10-CM | POA: Diagnosis not present

## 2018-05-21 ENCOUNTER — Ambulatory Visit
Admission: RE | Admit: 2018-05-21 | Discharge: 2018-05-21 | Disposition: A | Discharge status: Home / Self Care | Payer: Medicare Other | Source: Ambulatory Visit | Attending: Radiation Oncology | Admitting: Radiation Oncology

## 2018-05-21 DIAGNOSIS — D492 Neoplasm of unspecified behavior of bone, soft tissue, and skin: Secondary | ICD-10-CM | POA: Diagnosis not present

## 2018-05-21 DIAGNOSIS — C792 Secondary malignant neoplasm of skin: Secondary | ICD-10-CM | POA: Diagnosis not present

## 2018-05-21 DIAGNOSIS — C50411 Malignant neoplasm of upper-outer quadrant of right female breast: Secondary | ICD-10-CM | POA: Diagnosis not present

## 2018-05-21 DIAGNOSIS — Z510 Encounter for antineoplastic radiation therapy: Secondary | ICD-10-CM | POA: Diagnosis not present

## 2018-05-21 DIAGNOSIS — C7951 Secondary malignant neoplasm of bone: Secondary | ICD-10-CM | POA: Diagnosis not present

## 2018-05-22 ENCOUNTER — Ambulatory Visit
Admission: RE | Admit: 2018-05-22 | Discharge: 2018-05-22 | Disposition: A | Discharge status: Home / Self Care | Payer: Medicare Other | Source: Ambulatory Visit | Attending: Radiation Oncology | Admitting: Radiation Oncology

## 2018-05-22 DIAGNOSIS — D492 Neoplasm of unspecified behavior of bone, soft tissue, and skin: Secondary | ICD-10-CM | POA: Diagnosis not present

## 2018-05-22 DIAGNOSIS — C50411 Malignant neoplasm of upper-outer quadrant of right female breast: Secondary | ICD-10-CM | POA: Diagnosis not present

## 2018-05-22 DIAGNOSIS — Z510 Encounter for antineoplastic radiation therapy: Secondary | ICD-10-CM | POA: Diagnosis not present

## 2018-05-22 DIAGNOSIS — C792 Secondary malignant neoplasm of skin: Secondary | ICD-10-CM | POA: Diagnosis not present

## 2018-05-22 DIAGNOSIS — C7951 Secondary malignant neoplasm of bone: Secondary | ICD-10-CM | POA: Diagnosis not present

## 2018-05-23 ENCOUNTER — Ambulatory Visit
Admission: RE | Admit: 2018-05-23 | Discharge: 2018-05-23 | Disposition: A | Discharge status: Home / Self Care | Payer: Medicare Other | Source: Ambulatory Visit | Attending: Radiation Oncology | Admitting: Radiation Oncology

## 2018-05-23 ENCOUNTER — Telehealth: Payer: Self-pay

## 2018-05-23 DIAGNOSIS — D492 Neoplasm of unspecified behavior of bone, soft tissue, and skin: Secondary | ICD-10-CM | POA: Diagnosis not present

## 2018-05-23 DIAGNOSIS — C7951 Secondary malignant neoplasm of bone: Secondary | ICD-10-CM | POA: Diagnosis not present

## 2018-05-23 DIAGNOSIS — C50411 Malignant neoplasm of upper-outer quadrant of right female breast: Secondary | ICD-10-CM | POA: Diagnosis not present

## 2018-05-23 DIAGNOSIS — C792 Secondary malignant neoplasm of skin: Secondary | ICD-10-CM | POA: Diagnosis not present

## 2018-05-23 DIAGNOSIS — Z510 Encounter for antineoplastic radiation therapy: Secondary | ICD-10-CM | POA: Diagnosis not present

## 2018-05-23 NOTE — Telephone Encounter (Signed)
Called pt to notify her of follow up appt with Dr.Gudena, as well as starting treatment with ENHERTU infusion. LVM with call back number.

## 2018-05-24 ENCOUNTER — Ambulatory Visit
Admission: RE | Admit: 2018-05-24 | Discharge: 2018-05-24 | Disposition: A | Discharge status: Home / Self Care | Payer: Medicare Other | Source: Ambulatory Visit | Attending: Radiation Oncology | Admitting: Radiation Oncology

## 2018-05-24 DIAGNOSIS — C792 Secondary malignant neoplasm of skin: Secondary | ICD-10-CM | POA: Diagnosis not present

## 2018-05-24 DIAGNOSIS — D492 Neoplasm of unspecified behavior of bone, soft tissue, and skin: Secondary | ICD-10-CM | POA: Diagnosis not present

## 2018-05-24 DIAGNOSIS — C50411 Malignant neoplasm of upper-outer quadrant of right female breast: Secondary | ICD-10-CM | POA: Diagnosis not present

## 2018-05-24 DIAGNOSIS — C7951 Secondary malignant neoplasm of bone: Secondary | ICD-10-CM | POA: Diagnosis not present

## 2018-05-24 DIAGNOSIS — Z510 Encounter for antineoplastic radiation therapy: Secondary | ICD-10-CM | POA: Diagnosis not present

## 2018-05-24 MED ORDER — RADIAPLEXRX EX GEL
Freq: Once | CUTANEOUS | Status: AC
  Administered 2018-05-24: 16:00:00 via TOPICAL

## 2018-05-25 ENCOUNTER — Ambulatory Visit
Admission: RE | Admit: 2018-05-25 | Discharge: 2018-05-25 | Disposition: A | Discharge status: Home / Self Care | Payer: Medicare Other | Source: Ambulatory Visit | Attending: Radiation Oncology | Admitting: Radiation Oncology

## 2018-05-25 ENCOUNTER — Other Ambulatory Visit: Payer: Self-pay | Admitting: Hematology and Oncology

## 2018-05-25 DIAGNOSIS — D492 Neoplasm of unspecified behavior of bone, soft tissue, and skin: Secondary | ICD-10-CM | POA: Diagnosis not present

## 2018-05-25 DIAGNOSIS — C50411 Malignant neoplasm of upper-outer quadrant of right female breast: Secondary | ICD-10-CM | POA: Diagnosis not present

## 2018-05-25 DIAGNOSIS — Z510 Encounter for antineoplastic radiation therapy: Secondary | ICD-10-CM | POA: Diagnosis not present

## 2018-05-25 DIAGNOSIS — C792 Secondary malignant neoplasm of skin: Secondary | ICD-10-CM | POA: Diagnosis not present

## 2018-05-25 DIAGNOSIS — C7951 Secondary malignant neoplasm of bone: Secondary | ICD-10-CM | POA: Diagnosis not present

## 2018-05-29 ENCOUNTER — Telehealth: Payer: Self-pay | Admitting: Hematology and Oncology

## 2018-05-29 NOTE — Telephone Encounter (Signed)
Scheduled appt per 1/21 sch message - pt is aware of appt date and time   

## 2018-05-31 DIAGNOSIS — H25013 Cortical age-related cataract, bilateral: Secondary | ICD-10-CM | POA: Diagnosis not present

## 2018-05-31 DIAGNOSIS — H2513 Age-related nuclear cataract, bilateral: Secondary | ICD-10-CM | POA: Diagnosis not present

## 2018-05-31 DIAGNOSIS — H25043 Posterior subcapsular polar age-related cataract, bilateral: Secondary | ICD-10-CM | POA: Diagnosis not present

## 2018-05-31 DIAGNOSIS — H04123 Dry eye syndrome of bilateral lacrimal glands: Secondary | ICD-10-CM | POA: Diagnosis not present

## 2018-06-01 ENCOUNTER — Inpatient Hospital Stay (HOSPITAL_BASED_OUTPATIENT_CLINIC_OR_DEPARTMENT_OTHER): Payer: Medicare Other | Admitting: Hematology and Oncology

## 2018-06-01 ENCOUNTER — Encounter: Payer: Self-pay | Admitting: Radiation Oncology

## 2018-06-01 ENCOUNTER — Inpatient Hospital Stay: Payer: Medicare Other

## 2018-06-01 ENCOUNTER — Other Ambulatory Visit: Payer: Self-pay | Admitting: Hematology and Oncology

## 2018-06-01 DIAGNOSIS — Z923 Personal history of irradiation: Secondary | ICD-10-CM

## 2018-06-01 DIAGNOSIS — C50911 Malignant neoplasm of unspecified site of right female breast: Secondary | ICD-10-CM

## 2018-06-01 DIAGNOSIS — Z792 Long term (current) use of antibiotics: Secondary | ICD-10-CM

## 2018-06-01 DIAGNOSIS — Z170 Estrogen receptor positive status [ER+]: Secondary | ICD-10-CM

## 2018-06-01 DIAGNOSIS — C7951 Secondary malignant neoplasm of bone: Secondary | ICD-10-CM | POA: Diagnosis not present

## 2018-06-01 DIAGNOSIS — C50411 Malignant neoplasm of upper-outer quadrant of right female breast: Secondary | ICD-10-CM

## 2018-06-01 DIAGNOSIS — Z9011 Acquired absence of right breast and nipple: Secondary | ICD-10-CM

## 2018-06-01 DIAGNOSIS — R110 Nausea: Secondary | ICD-10-CM | POA: Diagnosis not present

## 2018-06-01 DIAGNOSIS — Z95828 Presence of other vascular implants and grafts: Secondary | ICD-10-CM

## 2018-06-01 DIAGNOSIS — C7931 Secondary malignant neoplasm of brain: Secondary | ICD-10-CM

## 2018-06-01 DIAGNOSIS — C792 Secondary malignant neoplasm of skin: Secondary | ICD-10-CM | POA: Diagnosis not present

## 2018-06-01 DIAGNOSIS — G9529 Other cord compression: Secondary | ICD-10-CM

## 2018-06-01 LAB — CMP (CANCER CENTER ONLY)
ALT: 22 U/L (ref 0–44)
AST: 29 U/L (ref 15–41)
Albumin: 3.7 g/dL (ref 3.5–5.0)
Alkaline Phosphatase: 98 U/L (ref 38–126)
Anion gap: 9 (ref 5–15)
BUN: 16 mg/dL (ref 8–23)
CO2: 28 mmol/L (ref 22–32)
Calcium: 10.7 mg/dL — ABNORMAL HIGH (ref 8.9–10.3)
Chloride: 103 mmol/L (ref 98–111)
Creatinine: 0.8 mg/dL (ref 0.44–1.00)
GFR, Est AFR Am: >60 mL/min (ref >60)
GFR, Estimated: >60 mL/min (ref >60)
Glucose, Bld: 98 mg/dL (ref 70–99)
Potassium: 4 mmol/L (ref 3.5–5.1)
Sodium: 140 mmol/L (ref 135–145)
Total Bilirubin: 0.5 mg/dL (ref 0.3–1.2)
Total Protein: 7.3 g/dL (ref 6.5–8.1)

## 2018-06-01 LAB — CBC WITH DIFFERENTIAL (CANCER CENTER ONLY)
Abs Immature Granulocytes: 0.01 10*3/uL (ref 0.00–0.07)
Basophils Absolute: 0 10*3/uL (ref 0.0–0.1)
Basophils Relative: 1 %
Eosinophils Absolute: 0.1 10*3/uL (ref 0.0–0.5)
Eosinophils Relative: 2 %
HCT: 38 % (ref 36.0–46.0)
Hemoglobin: 12.4 g/dL (ref 12.0–15.0)
Immature Granulocytes: 0 %
Lymphocytes Relative: 9 %
Lymphs Abs: 0.3 10*3/uL — ABNORMAL LOW (ref 0.7–4.0)
MCH: 30 pg (ref 26.0–34.0)
MCHC: 32.6 g/dL (ref 30.0–36.0)
MCV: 91.8 fL (ref 80.0–100.0)
Monocytes Absolute: 0.4 10*3/uL (ref 0.1–1.0)
Monocytes Relative: 10 %
Neutro Abs: 3 10*3/uL (ref 1.7–7.7)
Neutrophils Relative %: 78 %
Platelet Count: 188 10*3/uL (ref 150–400)
RBC: 4.14 MIL/uL (ref 3.87–5.11)
RDW: 14.1 % (ref 11.5–15.5)
WBC Count: 3.8 10*3/uL — ABNORMAL LOW (ref 4.0–10.5)
nRBC: 0 % (ref 0.0–0.2)

## 2018-06-01 MED ORDER — DEXAMETHASONE SODIUM PHOSPHATE 10 MG/ML IJ SOLN
INTRAMUSCULAR | Status: AC
  Filled 2018-06-01: qty 1

## 2018-06-01 MED ORDER — DIPHENHYDRAMINE HCL 25 MG PO CAPS
25.0000 mg | ORAL_CAPSULE | Freq: Once | ORAL | Status: AC
  Administered 2018-06-01: 25 mg via ORAL

## 2018-06-01 MED ORDER — ACETAMINOPHEN 325 MG PO TABS
ORAL_TABLET | ORAL | Status: AC
  Filled 2018-06-01: qty 2

## 2018-06-01 MED ORDER — DIPHENHYDRAMINE HCL 25 MG PO CAPS
ORAL_CAPSULE | ORAL | Status: AC
  Filled 2018-06-01: qty 1

## 2018-06-01 MED ORDER — PALONOSETRON HCL INJECTION 0.25 MG/5ML
0.2500 mg | Freq: Once | INTRAVENOUS | Status: AC
  Administered 2018-06-01: 0.25 mg via INTRAVENOUS

## 2018-06-01 MED ORDER — SODIUM CHLORIDE 0.9% FLUSH
10.0000 mL | INTRAVENOUS | Status: DC | PRN
  Administered 2018-06-01: 10 mL
  Filled 2018-06-01: qty 10

## 2018-06-01 MED ORDER — DEXTROSE 5 % IV SOLN
Freq: Once | INTRAVENOUS | Status: AC
  Administered 2018-06-01: 09:00:00 via INTRAVENOUS
  Filled 2018-06-01: qty 250

## 2018-06-01 MED ORDER — DEXAMETHASONE SODIUM PHOSPHATE 10 MG/ML IJ SOLN
10.0000 mg | Freq: Once | INTRAMUSCULAR | Status: AC
  Administered 2018-06-01: 10 mg via INTRAVENOUS

## 2018-06-01 MED ORDER — SODIUM CHLORIDE 0.9% FLUSH
10.0000 mL | INTRAVENOUS | Status: DC | PRN
  Administered 2018-06-01: 10 mL via INTRAVENOUS
  Filled 2018-06-01: qty 10

## 2018-06-01 MED ORDER — ONDANSETRON HCL 8 MG PO TABS: 8.0000 mg | ORAL_TABLET | Freq: Three times a day (TID) | ORAL | 0 refills | Status: DC | PRN

## 2018-06-01 MED ORDER — HEPARIN SOD (PORK) LOCK FLUSH 100 UNIT/ML IV SOLN
500.0000 [IU] | Freq: Once | INTRAVENOUS | Status: AC | PRN
  Administered 2018-06-01: 500 [IU]
  Filled 2018-06-01: qty 5

## 2018-06-01 MED ORDER — ACETAMINOPHEN 325 MG PO TABS
650.0000 mg | ORAL_TABLET | Freq: Once | ORAL | Status: AC
  Administered 2018-06-01: 650 mg via ORAL

## 2018-06-01 MED ORDER — PALONOSETRON HCL INJECTION 0.25 MG/5ML
INTRAVENOUS | Status: AC
  Filled 2018-06-01: qty 5

## 2018-06-01 MED ORDER — FAM-TRASTUZUMAB DERUXTECAN-NXKI CHEMO 100 MG IV SOLR
5.4000 mg/kg | Freq: Once | INTRAVENOUS | Status: AC
  Administered 2018-06-01: 332 mg via INTRAVENOUS
  Filled 2018-06-01: qty 16.6

## 2018-06-01 NOTE — Assessment & Plan Note (Addendum)
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 MRI 02/14/2015: T5 large destructive lesion with pathologic compression fracture and extensive epidural tumor, involvement of T4, excision metastatic carcinoma ER 60%, PR 0%, HER-2 positive ratio 2.71 average copy #7.45 03/27/2015: T4-6 decompression surgery for spinal cord compression PET-CT 12/10/17Rt para-spinal mass mildy hypermetabolic SUV 5.9, lytic cortical lesion 4.8 cm lesion left prox femur suv 8.8 favor osseus met disease Echo 04/12/2017 shows well preserved ejection fraction of 55-60%  Treatment summary: 1. Resumed anastrozole 05/21/2015, but it has caused significant hair loss so we switched her to exemestane 25 mg once daily 07/23/2015 2. Herceptin every 3 week started 04/10/2015 3. Bone metastases: Zometa every 3 months-next duelate May/early June 4. Radiation therapy to the spine (Dr. [name]) 04/13/2015 to 05/19/2015 5. Radiation therapy to left femur 04/28/2016 to 05/12/2016  6. SRS to the brain lesion 11/24/2016 7.Kadcyla started 05/05/2017 discontinued 07/27/2017 8.Palbociclib +Faslodex +trastuzumab+ Pertuzumabstarted 08/10/2017, stopped 04/04/2018 9. Enhertu started 06/01/18 -------------------------------------------------------------------------------------------------------------------------- PET CT scan 04/03/2018: Multiple new bone lesions including skull, right L2, T12, left distal femur, right distal femur, right acromium.  Posterior upper chest wall lesions increase in size and nodularity.  Because of this evidence of progression, I recommended switching her treatment to [facility] which is a novel antibody drug conjugate between trastuzumab and SN 32.  Most recent clinical trial destiny 01 revealed a  60% response rate and progression free survival of up to 17 months.  Major toxicities to be watchful of include interstitial lung disease, cytopenias, LV dysfunction. The most common adverse reactions (frequency ?20%) were nausea (79%), fatigue (59%), vomiting (47%), alopecia (46%), constipation (35%), decreased appetite (32%), anemia (31%), neutropenia (29%), diarrhea (29%), leukopenia (22%), cough (20%), and thrombocytopenia (20%).  Treatment plan:Fam-trastuzumab-Durextecan q 3 weeks today is cycle 1  Return to clinic in 1 week for toxicity check

## 2018-06-01 NOTE — Patient Instructions (Signed)
[facility] Discharge Instructions for Patients Receiving Chemotherapy  Today you received the following chemotherapy agents:  fam-trastuzumab-deroxtecan nxki [facility])  To help prevent nausea and vomiting after your treatment, we encourage you to take your nausea medication as prescribed.   If you develop nausea and vomiting that is not controlled by your nausea medication, call the clinic.   BELOW ARE SYMPTOMS THAT SHOULD BE REPORTED IMMEDIATELY:  *FEVER GREATER THAN 100.5 F  *CHILLS WITH OR WITHOUT FEVER  NAUSEA AND VOMITING THAT IS NOT CONTROLLED WITH YOUR NAUSEA MEDICATION  *UNUSUAL SHORTNESS OF BREATH  *UNUSUAL BRUISING OR BLEEDING  TENDERNESS IN MOUTH AND THROAT WITH OR WITHOUT PRESENCE OF ULCERS  *URINARY PROBLEMS  *BOWEL PROBLEMS  UNUSUAL RASH Items with * indicate a potential emergency and should be followed up as soon as possible.  Feel free to call the clinic should you have any questions or concerns. The clinic phone number is [phone].  Please show the [place] at check-in to the Emergency Department and triage nurse.

## 2018-06-01 NOTE — Progress Notes (Signed)
Patient Care Team: [name], MD as PCP - General (Family Medicine)  DIAGNOSIS:  Encounter Diagnosis  Name Primary?  . Primary cancer of upper outer quadrant of right female breast ([place])     SUMMARY OF ONCOLOGIC HISTORY:   Primary cancer of upper outer quadrant of right female breast ([place])   03/08/2012 Initial Diagnosis    Right breast invasive ductal carcinoma ER positive PR negative HER-2 positive Ki-67 44%; another breast mass biopsied in the anterior part of the breast which was also positive for malignancy that was HER-2 negative    05/10/2012 Surgery    Right mastectomy and axillary lymph node dissection: Multifocal disease 5 cm, 1.7 cm, 1.6 cm, ER positive PR negative HER-2 positive Ki-67 44%, 3/7 lymph nodes positive    06/14/2012 - 06/12/2013 Chemotherapy    Adjuvant chemotherapy with [place] 6 followed by Herceptin maintenance    10/25/2012 - 12/11/2012 Radiation Therapy    Adjuvant radiation therapy    02/07/2013 -  Anti-estrogen oral therapy    Letrozole 2.5 mg daily    02/14/2015 Imaging    MRI spine: Large destructive T5 lesion with severe pathologic compression fracture and extensive epidural tumor, severe spinal stenosis with moderate cord compression, tumor involvement of T4    03/18/2015 PET scan    Residual enhancing soft tissue adjacent to the spinal cord. No evidence of metastatic disease. Nonspecific uptake the left nipple    03/27/2015 - 03/30/2015 Hospital Admission    T4-6 decompression for spinal cord compression (lower extremity paralysis)    04/10/2015 -  Chemotherapy    Palliative treatment with Herceptin every 3 weeks with letrozole 2.5 mg daily    04/13/2015 - 05/19/2015 Radiation Therapy    Palliative radiation treatment to the spine    09/01/2015 Imaging    CT chest abdomen pelvis: Pathologic fracture with posterior fusion at T5 no other evidence of metastatic disease in the chest abdomen pelvis    12/23/2015 Imaging    CT CAP: new nonspecific 0.6  cm lymph node was stated mediastinum needs follow-up CT, innumerable tiny groundglass pulmonary nodules throughout both lungs unchanged, patchy consolidation from radiation, T5 fracture, no mets    04/15/2016 PET scan    Rt para-spinal mass mildy hypermetabolic SUV 5.9, lytic cortical lesion 4.8 cm lesion left prox femur suv 8.8 favor osseus met disease    04/28/2016 - 05/12/2016 Radiation Therapy    Palliative XRT Left femur    05/24/2016 Procedure    Soft tissue mass biopsy back: Metastatic breast cancer ER 80%, PR 0%, Ki-67 50%, HER-2 positive ratio 2.25    06/09/2016 - 03/29/2017 Anti-estrogen oral therapy    Faslodex with Herceptin and Perjeta every 4 weeks    06/13/2016 - 06/15/2016 Hospital Admission    uncomplicated revision of previous thoracic instrumentation.    10/10/2016 PET scan    Interval development of new hypermetabolic 2.5 x 1.1 cm lesion posterior right eighth rib consistent with metastatic disease, stable right paraspinal lesion at T8, clustered soft tissue nodules in the posterior midline back near cervicothoracic junction smaller than before     11/03/2016 - 11/07/2016 Radiation Therapy    Palliative radiation to right eighth rib    11/04/2016 Imaging    Solitary contrast-enhancing lesion in the right cerebellum 6 x 2 mm concerning for metastatic lesion    11/24/2016 - 11/24/2016 Radiation Therapy    SRS to the brain lesion    04/18/2017 PET scan    New ill-defined rounded airspace opacity  in posterior right lower lobe. Differential diagnosis includes malignancy and infectious/inflammatory process.Increased hypermetabolic lytic lesion in T8 vertebral body, consistent with bone metastasis.  Increased hypermetabolic posterior upper chest wall subcutaneous nodules, highly suspicious metastatic disease. Further improvement in right posterior eighth rib metastasis.    05/04/2017 - 07/27/2017 Chemotherapy    Kadcyla every 3 weeks palliative chemotherapy stopped for progression     07/26/2017 Imaging    Interval increase in size of the multiple enhancing masses posteriorly overlying the spine, interval development of left axillary and mediastinal adenopathy, increase in the size of lytic lesions in T8-T9 and left ninth rib, increased groundglass opacities right lower lobe lung    07/27/2017 - 05/11/2018 Chemotherapy    Palbociclib + Faslodex + trastuzumab + Pertuzumab      04/03/2018 PET scan    PET CT scan showed progression of disease not only in the bones but also in the subcutaneous nodules.    05/15/2018 - 05/25/2018 Radiation Therapy    Palliative radiation to the back    06/01/2018 -  Chemotherapy    Enhertu (Transtuzumab Deruxtecan)      Breast cancer metastasized to bone, right ([place])   02/14/2015 Initial Diagnosis    Breast cancer metastasized to bone, right ([place])    06/01/2018 -  Chemotherapy    The patient had palonosetron (ALOXI) injection 0.25 mg, 0.25 mg, Intravenous,  Once, 0 of 5 cycles fam-trastuzumab deruxtecan-nxki (ENHERTU) 332 mg in dextrose 5 % 100 mL chemo infusion, 5.4 mg/kg = 332 mg, Intravenous,  Once, 0 of 5 cycles  for chemotherapy treatment.      CHIEF COMPLIANT: Cycle 1 Enhertu  INTERVAL HISTORY: JOYCELYNN [name] is a 73 year old with above-mentioned history metastatic breast cancer who is here to begin her first cycle of chemotherapy with trastuzumab-Deruxtecan (Enhertu).  She finished radiation to the back and had significant improvement in the tumors however 1 of the tumors has not shrunk.  The pain across the back is also improved.  She is complaining of bladder discomfort for which she plans to see her primary care physician.  REVIEW OF SYSTEMS:   Constitutional: Denies fevers, chills or abnormal weight loss Eyes: Denies blurriness of vision Ears, nose, mouth, throat, and face: Denies mucositis or sore throat Respiratory: Denies cough, dyspnea or wheezes Cardiovascular: Denies palpitation, chest discomfort Gastrointestinal:  Denies  nausea, heartburn or change in bowel habits Skin: Large tumors in the back and subcutaneous and cutaneous tumors as well. Lymphatics: Denies new lymphadenopathy or easy bruising Neurological:Denies numbness, tingling or new weaknesses Behavioral/Psych: Mood is stable, no new changes  Extremities: No lower extremity edema  All other systems were reviewed with the patient and are negative.  I have reviewed the past medical history, past surgical history, social history and family history with the patient and they are unchanged from previous note.  ALLERGIES:  is allergic to other; acyclovir and related; chlorhexidine; zanaflex [tizanidine hcl]; codeine; keflex [cephalexin]; naprosyn [naproxen]; oxycodone; tramadol; and tussin [guaifenesin].  MEDICATIONS:  Current Outpatient Medications  Medication Sig Dispense Refill  . ALPRAZolam (XANAX) 0.25 MG tablet Take 1 tablet by mouth as needed.    Marland Kitchen b complex vitamins tablet Take 1 tablet by mouth daily.    . Calcium Carbonate Antacid (TUMS PO) Take 2 tablets by mouth 2 (two) times daily as needed (acid reflux).    . Cholecalciferol (VITAMIN D) 2000 UNITS tablet Take 2,000 Units by mouth daily.    . Coenzyme Q10 (COQ10) 100 MG CAPS Take 100 mg  by mouth as needed. Days that she takes pravastatin MWF.    Marland Kitchen fluticasone (FLONASE) 50 MCG/ACT nasal spray Place 1 spray into both nostrils daily as needed.    . gabapentin (NEURONTIN) 600 MG tablet Take 1 tablet (600 mg total) by mouth as directed. Take 1 tablet in the AM and afternoon and 1 1/2 tabs at bedtime (Patient taking differently: Take 600 mg by mouth 3 (three) times daily. Take 1 tablet in the AM and afternoon and 1 1/2 tabs at bedtime) 315 tablet 2  . HYDROcodone-acetaminophen (NORCO/VICODIN) 5-325 MG tablet Take 1 tablet by mouth every 6 (six) hours as needed for moderate pain. (Patient not taking: Reported on 04/09/2018) 30 tablet 0  . lidocaine-prilocaine (EMLA) cream Apply to affected area once  (Patient taking differently: Apply 1 application topically every 30 (thirty) days. Apply to port prior to infusions) 30 g 3  . omeprazole (PRILOSEC) 40 MG capsule TAKE 1 CAPSULE BY MOUTH ONCE DAILY FOR 30 DAYS  5  . ondansetron (ZOFRAN) 8 MG tablet Take 1 tablet (8 mg total) by mouth every 8 (eight) hours as needed for nausea or vomiting. 20 tablet 0  . OVER THE COUNTER MEDICATION Place 1 drop into both eyes 2 (two) times daily as needed (dry eyes). Over the counter lubricating eye drops    . polyethylene glycol (MIRALAX / GLYCOLAX) packet Take 17 g by mouth daily as needed.    . Probiotic Product (PROBIOTIC DAILY PO) Take 1 capsule by mouth daily.     Marland Kitchen spironolactone (ALDACTONE) 25 MG tablet Take 25 mg by mouth daily.    Marland Kitchen tobramycin-dexamethasone (TOBRADEX) ophthalmic solution Place 1 drop into both eyes every 6 (six) hours. (Patient not taking: Reported on 04/09/2018) 5 mL 0   No current facility-administered medications for this visit.    Facility-Administered Medications Ordered in Other Visits  Medication Dose Route Frequency Provider Last Rate Last Dose  . sodium chloride 0.9 % injection 10 mL  10 mL Intravenous PRN [name], MD   10 mL at 04/26/17 0828  . sodium chloride flush (NS) 0.9 % injection 10 mL  10 mL Intravenous PRN [name], MD   10 mL at 06/01/18 2505    PHYSICAL EXAMINATION: ECOG PERFORMANCE STATUS: 1 - Symptomatic but completely ambulatory  Vitals:   06/01/18 0833  BP: 135/76  Pulse: 76  Resp: 16  Temp: (!) 97.5 F (36.4 C)  SpO2: 99%   Filed Weights   06/01/18 0833  Weight: 136 lb 4.8 oz (61.8 kg)    GENERAL:alert, no distress and comfortable SKIN: skin color, texture, turgor are normal, no rashes or significant lesions EYES: normal, Conjunctiva are pink and non-injected, sclera clear OROPHARYNX:no exudate, no erythema and lips, buccal mucosa, and tongue normal  NECK: supple, thyroid normal size, non-tender, without nodularity LYMPH:  no  palpable lymphadenopathy in the cervical, axillary or inguinal LUNGS: clear to auscultation and percussion with normal breathing effort HEART: regular rate & rhythm and no murmurs and no lower extremity edema ABDOMEN:abdomen soft, non-tender and normal bowel sounds MUSCULOSKELETAL:no cyanosis of digits and no clubbing  NEURO: alert & oriented x 3 with fluent speech, no focal motor/sensory deficits EXTREMITIES: No lower extremity edema     LABORATORY DATA:  I have reviewed the data as listed CMP Latest Ref Rng & Units 04/04/2018 03/01/2018 01/24/2018  Glucose 70 - 99 mg/dL 121(H) 103(H) 100(H)  BUN 8 - 23 mg/dL '14 13 14  '$ Creatinine 0.44 - 1.00 mg/dL 0.83 0.84  0.86  Sodium 135 - 145 mmol/L 139 135 139  Potassium 3.5 - 5.1 mmol/L 3.8 4.0 3.9  Chloride 98 - 111 mmol/L 103 96(L) 102  CO2 22 - 32 mmol/L '26 28 27  '$ Calcium 8.9 - 10.3 mg/dL 10.2 10.7(H) 10.1  Total Protein 6.5 - 8.1 g/dL 7.2 7.6 7.4  Total Bilirubin 0.3 - 1.2 mg/dL 0.5 0.4 0.6  Alkaline Phos 38 - 126 U/L 66 81 73  AST 15 - 41 U/L 31 42(H) 29  ALT 0 - 44 U/L 16 33 16    Lab Results  Component Value Date   WBC 3.8 (L) 06/01/2018   HGB 12.4 06/01/2018   HCT 38.0 06/01/2018   MCV 91.8 06/01/2018   PLT 188 06/01/2018   NEUTROABS 3.0 06/01/2018    ASSESSMENT & PLAN:  Primary cancer of upper outer quadrant of right female breast ([place]) Right breast invasive ductal carcinoma ER positive PR negative HER-2 positive Ki-67 44% multifocal disease 3/7 lymph nodes positive T2 N1 M0 stage IIB status post adjuvant chemotherapy with TCH followed by Herceptin maintenance, adjuvant radiation therapy and Letrozole. MRI 02/14/2015: T5 large destructive lesion with pathologic compression fracture and extensive epidural tumor, involvement of T4, excision metastatic carcinoma ER 60%, PR 0%, HER-2 positive ratio 2.71 average copy #7.45 03/27/2015: T4-6 decompression surgery for spinal cord compression PET-CT 12/10/17Rt para-spinal mass mildy  hypermetabolic SUV 5.9, lytic cortical lesion 4.8 cm lesion left prox femur suv 8.8 favor osseus met disease Echo 04/12/2017 shows well preserved ejection fraction of 55-60%  Treatment summary: 1. Resumed anastrozole 05/21/2015, but it has caused significant hair loss so we switched her to exemestane 25 mg once daily 07/23/2015 2. Herceptin every 3 week started 04/10/2015 3. Bone metastases: Zometa every 3 months-next duelate May/early June 4. Radiation therapy to the spine (Dr. [name]) 04/13/2015 to 05/19/2015 5. Radiation therapy to left femur 04/28/2016 to 05/12/2016  6. SRS to the brain lesion 11/24/2016 7.Kadcyla started 05/05/2017 discontinued 07/27/2017 8.Palbociclib +Faslodex +trastuzumab+ Pertuzumabstarted 08/10/2017, stopped 04/04/2018 9. Enhertu started 06/01/18 -------------------------------------------------------------------------------------------------------------------------- PET CT scan 04/03/2018: Multiple new bone lesions including skull, right L2, T12, left distal femur, right distal femur, right acromium.  Posterior upper chest wall lesions increase in size and nodularity.  Because of this evidence of progression, I recommended switching her treatment to [facility] which is a novel antibody drug conjugate between trastuzumab and SN 32.  Most recent clinical trial destiny 01 revealed a 60% response rate and progression free survival of up to 17 months.  Major toxicities to be watchful of include interstitial lung disease, cytopenias, LV dysfunction. The most common adverse reactions (frequency ?20%) were nausea (79%), fatigue (59%), vomiting (47%), alopecia (46%), constipation (35%), decreased appetite (32%), anemia (31%), neutropenia (29%), diarrhea (29%), leukopenia (22%), cough (20%), and thrombocytopenia (20%).  Treatment plan:Fam-trastuzumab-Durextecan q 3 weeks today is cycle 1  Return to clinic in 1 week for toxicity check    No orders of the defined types were  placed in this encounter.  The patient has a good understanding of the overall plan. she agrees with it. she will call with any problems that may develop before the next visit here.   Harriette Ohara, MD 06/01/18

## 2018-06-01 NOTE — Progress Notes (Signed)
  Radiation Oncology         [phone] ________________________________  Name: Heather [name] MRN: [id number]  Date: 06/01/2018  DOB: [birth year]  End of Treatment Note  Diagnosis:   73 y.o. woman with subcutaneous lesions from metastatic breast cancer     Indication for treatment:  Palliative       Radiation treatment dates:   05/14/2018 - 05/25/2018  Site/dose:   Chest Wall, Right Upper Posterior / treated to 30 Gy delivered in 10 fractions of 3 Gy  Beams/energy:   3D/Complex Isodose Plan / 10X, 6X  Narrative: The patient tolerated radiation treatment relatively well. She experienced some burning and itching to the radiation site. She denied any pain or difficulty swallowing.  Plan: The patient has completed radiation treatment. The patient will return to radiation oncology clinic for routine followup in one month. I advised her to call or return sooner if she has any questions or concerns related to her recovery or treatment. ________________________________  Sheral Apley. [name], M.D.   This document serves as a record of services personally performed by Tyler Pita, MD. It was created on his behalf by Wilburn Mylar, a trained medical scribe. The creation of this record is based on the scribe's personal observations and the provider's statements to them. This document has been checked and approved by the attending provider.

## 2018-06-06 NOTE — Progress Notes (Signed)
Patient Care Team: [name], MD as PCP - General (Family Medicine)  DIAGNOSIS:    ICD-10-CM   1. Primary cancer of upper outer quadrant of right female breast ([place]) C50.411     SUMMARY OF ONCOLOGIC HISTORY:   Primary cancer of upper outer quadrant of right female breast ([place])   03/08/2012 Initial Diagnosis    Right breast invasive ductal carcinoma ER positive PR negative HER-2 positive Ki-67 44%; another breast mass biopsied in the anterior part of the breast which was also positive for malignancy that was HER-2 negative    05/10/2012 Surgery    Right mastectomy and axillary lymph node dissection: Multifocal disease 5 cm, 1.7 cm, 1.6 cm, ER positive PR negative HER-2 positive Ki-67 44%, 3/7 lymph nodes positive    06/14/2012 - 06/12/2013 Chemotherapy    Adjuvant chemotherapy with [place] 6 followed by Herceptin maintenance    10/25/2012 - 12/11/2012 Radiation Therapy    Adjuvant radiation therapy    02/07/2013 -  Anti-estrogen oral therapy    Letrozole 2.5 mg daily    02/14/2015 Imaging    MRI spine: Large destructive T5 lesion with severe pathologic compression fracture and extensive epidural tumor, severe spinal stenosis with moderate cord compression, tumor involvement of T4    03/18/2015 PET scan    Residual enhancing soft tissue adjacent to the spinal cord. No evidence of metastatic disease. Nonspecific uptake the left nipple    03/27/2015 - 03/30/2015 Hospital Admission    T4-6 decompression for spinal cord compression (lower extremity paralysis)    04/10/2015 -  Chemotherapy    Palliative treatment with Herceptin every 3 weeks with letrozole 2.5 mg daily    04/13/2015 - 05/19/2015 Radiation Therapy    Palliative radiation treatment to the spine    09/01/2015 Imaging    CT chest abdomen pelvis: Pathologic fracture with posterior fusion at T5 no other evidence of metastatic disease in the chest abdomen pelvis    12/23/2015 Imaging    CT CAP: new nonspecific 0.6 cm lymph node  was stated mediastinum needs follow-up CT, innumerable tiny groundglass pulmonary nodules throughout both lungs unchanged, patchy consolidation from radiation, T5 fracture, no mets    04/15/2016 PET scan    Rt para-spinal mass mildy hypermetabolic SUV 5.9, lytic cortical lesion 4.8 cm lesion left prox femur suv 8.8 favor osseus met disease    04/28/2016 - 05/12/2016 Radiation Therapy    Palliative XRT Left femur    05/24/2016 Procedure    Soft tissue mass biopsy back: Metastatic breast cancer ER 80%, PR 0%, Ki-67 50%, HER-2 positive ratio 2.25    06/09/2016 - 03/29/2017 Anti-estrogen oral therapy    Faslodex with Herceptin and Perjeta every 4 weeks    06/13/2016 - 06/15/2016 Hospital Admission    uncomplicated revision of previous thoracic instrumentation.    10/10/2016 PET scan    Interval development of new hypermetabolic 2.5 x 1.1 cm lesion posterior right eighth rib consistent with metastatic disease, stable right paraspinal lesion at T8, clustered soft tissue nodules in the posterior midline back near cervicothoracic junction smaller than before     11/03/2016 - 11/07/2016 Radiation Therapy    Palliative radiation to right eighth rib    11/04/2016 Imaging    Solitary contrast-enhancing lesion in the right cerebellum 6 x 2 mm concerning for metastatic lesion    11/24/2016 - 11/24/2016 Radiation Therapy    SRS to the brain lesion    04/18/2017 PET scan    New ill-defined rounded airspace opacity  in posterior right lower lobe. Differential diagnosis includes malignancy and infectious/inflammatory process.Increased hypermetabolic lytic lesion in T8 vertebral body, consistent with bone metastasis.  Increased hypermetabolic posterior upper chest wall subcutaneous nodules, highly suspicious metastatic disease. Further improvement in right posterior eighth rib metastasis.    05/04/2017 - 07/27/2017 Chemotherapy    Kadcyla every 3 weeks palliative chemotherapy stopped for progression    07/26/2017  Imaging    Interval increase in size of the multiple enhancing masses posteriorly overlying the spine, interval development of left axillary and mediastinal adenopathy, increase in the size of lytic lesions in T8-T9 and left ninth rib, increased groundglass opacities right lower lobe lung    07/27/2017 - 05/11/2018 Chemotherapy    Palbociclib + Faslodex + trastuzumab + Pertuzumab      04/03/2018 PET scan    PET CT scan showed progression of disease not only in the bones but also in the subcutaneous nodules.    05/15/2018 - 05/25/2018 Radiation Therapy    Palliative radiation to the back    06/01/2018 -  Chemotherapy    Enhertu (Transtuzumab Deruxtecan)      Breast cancer metastasized to bone, right ([place])   02/14/2015 Initial Diagnosis    Breast cancer metastasized to bone, right ([place])    06/01/2018 -  Chemotherapy    The patient had palonosetron (ALOXI) injection 0.25 mg, 0.25 mg, Intravenous,  Once, 1 of 5 cycles Administration: 0.25 mg (06/01/2018) fam-trastuzumab deruxtecan-nxki (ENHERTU) 332 mg in dextrose 5 % 100 mL chemo infusion, 5.4 mg/kg = 332 mg, Intravenous,  Once, 1 of 5 cycles Administration: 332 mg (06/01/2018)  for chemotherapy treatment.      CHIEF COMPLIANT: Cycle 1 Day 8 Enhertu  INTERVAL HISTORY: Heather [name] is a 73 y.o. with above-mentioned history of metastatic breast cancer who is here for cycle 2 of chemotherapy with trastuzumab-Deruxtecan (Enhertu). She presents to the clinic alone today and notes her last treatment went okay. She was nauseous on Monday and did not take any medication. She has acid reflux, has lost her appetite, and is fatigued. She denies any other symptoms. She is worried and a bit anxious about how long she will have to be on treatment. The mass on her upper back is unchanged.   REVIEW OF SYSTEMS:   Constitutional: Denies fevers, chills or abnormal weight loss (+) loss of appetite (+) fatigued Eyes: Denies blurriness of vision Ears, nose,  mouth, throat, and face: Denies mucositis or sore throat  Respiratory: Denies cough, dyspnea or wheezes Cardiovascular: Denies palpitation, chest discomfort Gastrointestinal:  Denies nausea or change in bowel habits (+) heartburn  Skin: Denies abnormal skin rashes (+) mass in upper back Lymphatics: Denies new lymphadenopathy or easy bruising Neurological: Denies numbness, tingling or new weaknesses Behavioral/Psych: (+) anxiety Extremities: No lower extremity edema Breast: denies any pain or lumps or nodules in either breasts All other systems were reviewed with the patient and are negative.  I have reviewed the past medical history, past surgical history, social history and family history with the patient and they are unchanged from previous note.  ALLERGIES:  is allergic to other; acyclovir and related; chlorhexidine; zanaflex [tizanidine hcl]; codeine; keflex [cephalexin]; naprosyn [naproxen]; oxycodone; tramadol; and tussin [guaifenesin].  MEDICATIONS:  Current Outpatient Medications  Medication Sig Dispense Refill  . ALPRAZolam (XANAX) 0.25 MG tablet Take 1 tablet by mouth as needed.    Marland Kitchen b complex vitamins tablet Take 1 tablet by mouth daily.    . Calcium Carbonate Antacid (TUMS PO) Take  2 tablets by mouth 2 (two) times daily as needed (acid reflux).    . Cholecalciferol (VITAMIN D) 2000 UNITS tablet Take 2,000 Units by mouth daily.    . Coenzyme Q10 (COQ10) 100 MG CAPS Take 100 mg by mouth as needed. Days that she takes pravastatin MWF.    Marland Kitchen fluticasone (FLONASE) 50 MCG/ACT nasal spray Place 1 spray into both nostrils daily as needed.    . gabapentin (NEURONTIN) 600 MG tablet Take 1 tablet (600 mg total) by mouth as directed. Take 1 tablet in the AM and afternoon and 1 1/2 tabs at bedtime (Patient taking differently: Take 600 mg by mouth 3 (three) times daily. Take 1 tablet in the AM and afternoon and 1 1/2 tabs at bedtime) 315 tablet 2  . HYDROcodone-acetaminophen (NORCO/VICODIN)  5-325 MG tablet Take 1 tablet by mouth every 6 (six) hours as needed for moderate pain. (Patient not taking: Reported on 04/09/2018) 30 tablet 0  . lidocaine-prilocaine (EMLA) cream Apply to affected area once (Patient taking differently: Apply 1 application topically every 30 (thirty) days. Apply to port prior to infusions) 30 g 3  . omeprazole (PRILOSEC) 40 MG capsule TAKE 1 CAPSULE BY MOUTH ONCE DAILY FOR 30 DAYS  5  . ondansetron (ZOFRAN) 8 MG tablet Take 1 tablet (8 mg total) by mouth every 8 (eight) hours as needed for nausea or vomiting. 20 tablet 0  . OVER THE COUNTER MEDICATION Place 1 drop into both eyes 2 (two) times daily as needed (dry eyes). Over the counter lubricating eye drops    . polyethylene glycol (MIRALAX / GLYCOLAX) packet Take 17 g by mouth daily as needed.    . Probiotic Product (PROBIOTIC DAILY PO) Take 1 capsule by mouth daily.     Marland Kitchen spironolactone (ALDACTONE) 25 MG tablet Take 25 mg by mouth daily.    Marland Kitchen tobramycin-dexamethasone (TOBRADEX) ophthalmic solution Place 1 drop into both eyes every 6 (six) hours. (Patient not taking: Reported on 04/09/2018) 5 mL 0   No current facility-administered medications for this visit.    Facility-Administered Medications Ordered in Other Visits  Medication Dose Route Frequency Provider Last Rate Last Dose  . sodium chloride 0.9 % injection 10 mL  10 mL Intravenous PRN [name], MD   10 mL at 04/26/17 0828  . sodium chloride flush (NS) 0.9 % injection 10 mL  10 mL Intravenous PRN [name], MD   10 mL at 06/07/18 0906    PHYSICAL EXAMINATION: ECOG PERFORMANCE STATUS: 1 - Symptomatic but completely ambulatory  Vitals:   06/07/18 0934  BP: 136/62  Pulse: 83  Resp: 18  Temp: (!) 97.4 F (36.3 C)  SpO2: 99%   Filed Weights   06/07/18 0934  Weight: 60.7 kg    GENERAL: alert, no distress and comfortable SKIN: skin color, texture, turgor are normal, no rashes or significant lesions EYES: normal, Conjunctiva are pink  and non-injected, sclera clear OROPHARYNX: no exudate, no erythema and lips, buccal mucosa, and tongue normal  NECK: supple, thyroid normal size, non-tender, without nodularity  LYMPH: no palpable lymphadenopathy in the cervical, axillary or inguinal LUNGS: clear to auscultation and percussion with normal breathing effort HEART: regular rate & rhythm and no murmurs and no lower extremity edema ABDOMEN: abdomen soft, non-tender and normal bowel sounds MUSCULOSKELETAL: no cyanosis of digits and no clubbing  NEURO: alert & oriented x 3 with fluent speech, no focal motor/sensory deficits EXTREMITIES: No lower extremity edema  LABORATORY DATA:  I have reviewed the  data as listed CMP Latest Ref Rng & Units 06/01/2018 04/04/2018 03/01/2018  Glucose 70 - 99 mg/dL 98 121(H) 103(H)  BUN 8 - 23 mg/dL _0 Creatinine 0.44 - 1.00 mg/dL 0.80 0.83 0.84  Sodium 135 - 145 mmol/L 140 139 135  Potassium 3.5 - 5.1 mmol/L 4.0 3.8 4.0  Chloride 98 - 111 mmol/L 103 103 96(L)  CO2 22 - 32 mmol/L _1 Calcium 8.9 - 10.3 mg/dL 10.7(H) 10.2 10.7(H)  Total Protein 6.5 - 8.1 g/dL 7.3 7.2 7.6  Total Bilirubin 0.3 - 1.2 mg/dL 0.5 0.5 0.4  Alkaline Phos 38 - 126 U/L 98 66 81  AST 15 - 41 U/L 29 31 42(H)  ALT 0 - 44 U/L 22 16 33    Lab Results  Component Value Date   WBC 4.4 06/07/2018   HGB 11.9 (L) 06/07/2018   HCT 37.0 06/07/2018   MCV 91.1 06/07/2018   PLT 137 (L) 06/07/2018   NEUTROABS 4.1 06/07/2018    ASSESSMENT & PLAN:  Primary cancer of upper outer quadrant of right female breast ([place]) Right breast invasive ductal carcinoma ER positive PR negative HER-2 positive Ki-67 44% multifocal disease 3/7 lymph nodes positive T2 N1 M0 stage IIB status post adjuvant chemotherapy with TCH followed by Herceptin maintenance, adjuvant radiation therapy and Letrozole. MRI 02/14/2015: T5 large destructive lesion with pathologic compression fracture and extensive epidural tumor, involvement of T4, excision  metastatic carcinoma ER 60%, PR 0%, HER-2 positive ratio 2.71 average copy #7.45 03/27/2015: T4-6 decompression surgery for spinal cord compression PET-CT 12/10/17Rt para-spinal mass mildy hypermetabolic SUV 5.9, lytic cortical lesion 4.8 cm lesion left prox femur suv 8.8 favor osseus met disease Echo 04/12/2017 shows well preserved ejection fraction of 55-60%  Treatment summary: 1. Resumed anastrozole 05/21/2015, but it has caused significant hair loss so we switched her to exemestane 25 mg once daily 07/23/2015 2. Herceptin every 3 week started 04/10/2015 3. Bone metastases: Zometa every 3 months-next duelate May/early June 4. Radiation therapy to the spine (Dr. [name]) 04/13/2015 to 05/19/2015 5. Radiation therapy to left femur 04/28/2016 to 05/12/2016  6. SRS to the brain lesion 11/24/2016 7.Kadcyla started 05/05/2017 discontinued 07/27/2017 8.Palbociclib +Faslodex +trastuzumab+ Pertuzumabstarted 08/10/2017, stopped 04/04/2018 9. Enhertu started 06/01/18 -------------------------------------------------------------------------------------------------------------------------- PET CT scan 04/03/2018: Multiple new bone lesions including skull, right L2, T12, left distal femur, right distal femur, right acromium.  Posterior upper chest wall lesions increase in size and nodularity.  Current treatment: Cycle 1 day 8 Enhertu (Fam-trastuzumab-Durextecan)  Toxicities: 1.  Nausea mild: Patient did not take antinausea medication and she will go ahead and fill Zofran today 2.  Fatigue  Patient tells me that her biggest challenge is not knowing how many treatments she will need and the fact that there is no endpoint makes her more worried about treatments.  We discussed that if it is responding then we can give her treatment breaks after several rounds.  Return to clinic in 2 weeks for cycle 2    No orders of the defined types were placed in this encounter.  The patient has a good  understanding of the overall plan. she agrees with it. she will call with any problems that may develop before the next visit here.  [name], MD 06/07/2018  Julious Oka Dorshimer am acting as scribe for Dr. [name].  I have reviewed the above documentation for accuracy and completeness, and I agree with the above.

## 2018-06-07 ENCOUNTER — Inpatient Hospital Stay: Payer: Medicare Other

## 2018-06-07 ENCOUNTER — Inpatient Hospital Stay (HOSPITAL_BASED_OUTPATIENT_CLINIC_OR_DEPARTMENT_OTHER): Payer: Medicare Other | Admitting: Hematology and Oncology

## 2018-06-07 DIAGNOSIS — C7951 Secondary malignant neoplasm of bone: Secondary | ICD-10-CM | POA: Diagnosis not present

## 2018-06-07 DIAGNOSIS — I10 Essential (primary) hypertension: Secondary | ICD-10-CM | POA: Diagnosis not present

## 2018-06-07 DIAGNOSIS — C7931 Secondary malignant neoplasm of brain: Secondary | ICD-10-CM

## 2018-06-07 DIAGNOSIS — R3989 Other symptoms and signs involving the genitourinary system: Secondary | ICD-10-CM | POA: Diagnosis not present

## 2018-06-07 DIAGNOSIS — R110 Nausea: Secondary | ICD-10-CM

## 2018-06-07 DIAGNOSIS — Z95828 Presence of other vascular implants and grafts: Secondary | ICD-10-CM

## 2018-06-07 DIAGNOSIS — R5383 Other fatigue: Secondary | ICD-10-CM | POA: Diagnosis not present

## 2018-06-07 DIAGNOSIS — C792 Secondary malignant neoplasm of skin: Secondary | ICD-10-CM

## 2018-06-07 DIAGNOSIS — Z170 Estrogen receptor positive status [ER+]: Secondary | ICD-10-CM | POA: Diagnosis not present

## 2018-06-07 DIAGNOSIS — C50411 Malignant neoplasm of upper-outer quadrant of right female breast: Secondary | ICD-10-CM

## 2018-06-07 DIAGNOSIS — C493 Malignant neoplasm of connective and soft tissue of thorax: Secondary | ICD-10-CM | POA: Diagnosis not present

## 2018-06-07 LAB — CBC WITH DIFFERENTIAL (CANCER CENTER ONLY)
Abs Immature Granulocytes: 0.02 10*3/uL (ref 0.00–0.07)
Basophils Absolute: 0 10*3/uL (ref 0.0–0.1)
Basophils Relative: 0 %
Eosinophils Absolute: 0.1 10*3/uL (ref 0.0–0.5)
Eosinophils Relative: 2 %
HCT: 37 % (ref 36.0–46.0)
Hemoglobin: 11.9 g/dL — ABNORMAL LOW (ref 12.0–15.0)
Immature Granulocytes: 1 %
Lymphocytes Relative: 3 %
Lymphs Abs: 0.1 10*3/uL — ABNORMAL LOW (ref 0.7–4.0)
MCH: 29.3 pg (ref 26.0–34.0)
MCHC: 32.2 g/dL (ref 30.0–36.0)
MCV: 91.1 fL (ref 80.0–100.0)
Monocytes Absolute: 0.1 10*3/uL (ref 0.1–1.0)
Monocytes Relative: 2 %
Neutro Abs: 4.1 10*3/uL (ref 1.7–7.7)
Neutrophils Relative %: 92 %
Platelet Count: 137 10*3/uL — ABNORMAL LOW (ref 150–400)
RBC: 4.06 MIL/uL (ref 3.87–5.11)
RDW: 13.7 % (ref 11.5–15.5)
WBC Count: 4.4 10*3/uL (ref 4.0–10.5)
nRBC: 0 % (ref 0.0–0.2)

## 2018-06-07 LAB — CMP (CANCER CENTER ONLY)
ALT: 17 U/L (ref 0–44)
AST: 26 U/L (ref 15–41)
Albumin: 3.8 g/dL (ref 3.5–5.0)
Alkaline Phosphatase: 98 U/L (ref 38–126)
Anion gap: 10 (ref 5–15)
BUN: 18 mg/dL (ref 8–23)
CO2: 26 mmol/L (ref 22–32)
Calcium: 10.4 mg/dL — ABNORMAL HIGH (ref 8.9–10.3)
Chloride: 99 mmol/L (ref 98–111)
Creatinine: 1 mg/dL (ref 0.44–1.00)
GFR, Est AFR Am: >60 mL/min (ref >60)
GFR, Estimated: 56 mL/min — ABNORMAL LOW (ref >60)
Glucose, Bld: 147 mg/dL — ABNORMAL HIGH (ref 70–99)
Potassium: 3.7 mmol/L (ref 3.5–5.1)
Sodium: 135 mmol/L (ref 135–145)
Total Bilirubin: 0.6 mg/dL (ref 0.3–1.2)
Total Protein: 7.4 g/dL (ref 6.5–8.1)

## 2018-06-07 MED ORDER — HEPARIN SOD (PORK) LOCK FLUSH 100 UNIT/ML IV SOLN
500.0000 [IU] | Freq: Once | INTRAVENOUS | Status: AC
  Administered 2018-06-07: 500 [IU] via INTRAVENOUS
  Filled 2018-06-07: qty 5

## 2018-06-07 MED ORDER — SODIUM CHLORIDE 0.9% FLUSH
10.0000 mL | INTRAVENOUS | Status: DC | PRN
  Administered 2018-06-07: 10 mL via INTRAVENOUS
  Filled 2018-06-07: qty 10

## 2018-06-07 NOTE — Assessment & Plan Note (Signed)
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 MRI 02/14/2015: T5 large destructive lesion with pathologic compression fracture and extensive epidural tumor, involvement of T4, excision metastatic carcinoma ER 60%, PR 0%, HER-2 positive ratio 2.71 average copy #7.45 03/27/2015: T4-6 decompression surgery for spinal cord compression PET-CT 12/10/17Rt para-spinal mass mildy hypermetabolic SUV 5.9, lytic cortical lesion 4.8 cm lesion left prox femur suv 8.8 favor osseus met disease Echo 04/12/2017 shows well preserved ejection fraction of 55-60%  Treatment summary: 1. Resumed anastrozole 05/21/2015, but it has caused significant hair loss so we switched her to exemestane 25 mg once daily 07/23/2015 2. Herceptin every 3 week started 04/10/2015 3. Bone metastases: Zometa every 3 months-next duelate May/early June 4. Radiation therapy to the spine (Dr. [name]) 04/13/2015 to 05/19/2015 5. Radiation therapy to left femur 04/28/2016 to 05/12/2016  6. SRS to the brain lesion 11/24/2016 7.Kadcyla started 05/05/2017 discontinued 07/27/2017 8.Palbociclib +Faslodex +trastuzumab+ Pertuzumabstarted 08/10/2017, stopped 04/04/2018 9. Enhertu started 06/01/18 -------------------------------------------------------------------------------------------------------------------------- PET CT scan 04/03/2018: Multiple new bone lesions including skull, right L2, T12, left distal femur, right distal femur, right acromium.  Posterior upper chest wall lesions increase in size and nodularity.  Current treatment: Cycle 1 day 8 Enhertu (Fam-trastuzumab-Durextecan)  Toxicities:  Return to clinic in 2 weeks for cycle 2

## 2018-06-08 ENCOUNTER — Telehealth: Payer: Self-pay | Admitting: Hematology and Oncology

## 2018-06-08 NOTE — Telephone Encounter (Signed)
No los °

## 2018-06-19 DIAGNOSIS — R3912 Poor urinary stream: Secondary | ICD-10-CM | POA: Diagnosis not present

## 2018-06-19 DIAGNOSIS — R3982 Chronic bladder pain: Secondary | ICD-10-CM | POA: Diagnosis not present

## 2018-06-19 DIAGNOSIS — R8279 Other abnormal findings on microbiological examination of urine: Secondary | ICD-10-CM | POA: Diagnosis not present

## 2018-06-22 ENCOUNTER — Inpatient Hospital Stay: Payer: Medicare Other

## 2018-06-22 ENCOUNTER — Inpatient Hospital Stay (HOSPITAL_BASED_OUTPATIENT_CLINIC_OR_DEPARTMENT_OTHER): Payer: Medicare Other | Admitting: Adult Health

## 2018-06-22 ENCOUNTER — Encounter: Payer: Self-pay | Admitting: Adult Health

## 2018-06-22 ENCOUNTER — Telehealth: Payer: Self-pay | Admitting: Adult Health

## 2018-06-22 ENCOUNTER — Inpatient Hospital Stay: Payer: Medicare Other | Attending: Hematology and Oncology

## 2018-06-22 VITALS — BP 149/70 | HR 73 | Temp 97.8°F | Resp 18 | Ht 64.0 in | Wt 134.4 lb

## 2018-06-22 DIAGNOSIS — Z9011 Acquired absence of right breast and nipple: Secondary | ICD-10-CM

## 2018-06-22 DIAGNOSIS — C50411 Malignant neoplasm of upper-outer quadrant of right female breast: Secondary | ICD-10-CM

## 2018-06-22 DIAGNOSIS — C7931 Secondary malignant neoplasm of brain: Secondary | ICD-10-CM

## 2018-06-22 DIAGNOSIS — Z95828 Presence of other vascular implants and grafts: Secondary | ICD-10-CM

## 2018-06-22 DIAGNOSIS — N390 Urinary tract infection, site not specified: Secondary | ICD-10-CM | POA: Insufficient documentation

## 2018-06-22 DIAGNOSIS — C792 Secondary malignant neoplasm of skin: Secondary | ICD-10-CM | POA: Diagnosis not present

## 2018-06-22 DIAGNOSIS — C7951 Secondary malignant neoplasm of bone: Secondary | ICD-10-CM

## 2018-06-22 DIAGNOSIS — Z9221 Personal history of antineoplastic chemotherapy: Secondary | ICD-10-CM

## 2018-06-22 DIAGNOSIS — Z79811 Long term (current) use of aromatase inhibitors: Secondary | ICD-10-CM | POA: Insufficient documentation

## 2018-06-22 DIAGNOSIS — Z170 Estrogen receptor positive status [ER+]: Secondary | ICD-10-CM | POA: Diagnosis not present

## 2018-06-22 DIAGNOSIS — H04129 Dry eye syndrome of unspecified lacrimal gland: Secondary | ICD-10-CM | POA: Insufficient documentation

## 2018-06-22 DIAGNOSIS — Z923 Personal history of irradiation: Secondary | ICD-10-CM

## 2018-06-22 DIAGNOSIS — G9529 Other cord compression: Secondary | ICD-10-CM

## 2018-06-22 DIAGNOSIS — Z79899 Other long term (current) drug therapy: Secondary | ICD-10-CM | POA: Diagnosis not present

## 2018-06-22 DIAGNOSIS — Z5111 Encounter for antineoplastic chemotherapy: Secondary | ICD-10-CM | POA: Diagnosis not present

## 2018-06-22 DIAGNOSIS — C50911 Malignant neoplasm of unspecified site of right female breast: Secondary | ICD-10-CM

## 2018-06-22 LAB — CMP (CANCER CENTER ONLY)
ALT: 22 U/L (ref 0–44)
AST: 29 U/L (ref 15–41)
Albumin: 3.6 g/dL (ref 3.5–5.0)
Alkaline Phosphatase: 105 U/L (ref 38–126)
Anion gap: 10 (ref 5–15)
BUN: 13 mg/dL (ref 8–23)
CO2: 25 mmol/L (ref 22–32)
Calcium: 9.4 mg/dL (ref 8.9–10.3)
Chloride: 105 mmol/L (ref 98–111)
Creatinine: 0.86 mg/dL (ref 0.44–1.00)
GFR, Est AFR Am: >60 mL/min (ref >60)
GFR, Estimated: >60 mL/min (ref >60)
Glucose, Bld: 105 mg/dL — ABNORMAL HIGH (ref 70–99)
Potassium: 3.8 mmol/L (ref 3.5–5.1)
Sodium: 140 mmol/L (ref 135–145)
Total Bilirubin: 0.4 mg/dL (ref 0.3–1.2)
Total Protein: 7.1 g/dL (ref 6.5–8.1)

## 2018-06-22 LAB — CBC WITH DIFFERENTIAL (CANCER CENTER ONLY)
Abs Immature Granulocytes: 0.01 10*3/uL (ref 0.00–0.07)
Basophils Absolute: 0.1 10*3/uL (ref 0.0–0.1)
Basophils Relative: 2 %
Eosinophils Absolute: 0.1 10*3/uL (ref 0.0–0.5)
Eosinophils Relative: 5 %
HCT: 36.1 % (ref 36.0–46.0)
Hemoglobin: 11.6 g/dL — ABNORMAL LOW (ref 12.0–15.0)
Immature Granulocytes: 0 %
Lymphocytes Relative: 14 %
Lymphs Abs: 0.4 10*3/uL — ABNORMAL LOW (ref 0.7–4.0)
MCH: 29.3 pg (ref 26.0–34.0)
MCHC: 32.1 g/dL (ref 30.0–36.0)
MCV: 91.2 fL (ref 80.0–100.0)
Monocytes Absolute: 0.4 10*3/uL (ref 0.1–1.0)
Monocytes Relative: 15 %
Neutro Abs: 1.7 10*3/uL (ref 1.7–7.7)
Neutrophils Relative %: 64 %
Platelet Count: 210 10*3/uL (ref 150–400)
RBC: 3.96 MIL/uL (ref 3.87–5.11)
RDW: 15.1 % (ref 11.5–15.5)
WBC Count: 2.6 10*3/uL — ABNORMAL LOW (ref 4.0–10.5)
nRBC: 0 % (ref 0.0–0.2)

## 2018-06-22 MED ORDER — HEPARIN SOD (PORK) LOCK FLUSH 100 UNIT/ML IV SOLN
500.0000 [IU] | Freq: Once | INTRAVENOUS | Status: AC | PRN
  Administered 2018-06-22: 500 [IU]
  Filled 2018-06-22: qty 5

## 2018-06-22 MED ORDER — CYANOCOBALAMIN 1000 MCG/ML IJ SOLN
1000.0000 ug | Freq: Once | INTRAMUSCULAR | Status: AC
  Administered 2018-06-22: 1000 ug via INTRAMUSCULAR

## 2018-06-22 MED ORDER — DIPHENHYDRAMINE HCL 25 MG PO CAPS
25.0000 mg | ORAL_CAPSULE | Freq: Once | ORAL | Status: AC
  Administered 2018-06-22: 25 mg via ORAL

## 2018-06-22 MED ORDER — FAM-TRASTUZUMAB DERUXTECAN-NXKI CHEMO 100 MG IV SOLR
5.4000 mg/kg | Freq: Once | INTRAVENOUS | Status: AC
  Administered 2018-06-22: 332 mg via INTRAVENOUS
  Filled 2018-06-22: qty 16.6

## 2018-06-22 MED ORDER — DIPHENHYDRAMINE HCL 25 MG PO CAPS
ORAL_CAPSULE | ORAL | Status: AC
  Filled 2018-06-22: qty 1

## 2018-06-22 MED ORDER — DEXAMETHASONE SODIUM PHOSPHATE 10 MG/ML IJ SOLN
INTRAMUSCULAR | Status: AC
  Filled 2018-06-22: qty 1

## 2018-06-22 MED ORDER — PALONOSETRON HCL INJECTION 0.25 MG/5ML
0.2500 mg | Freq: Once | INTRAVENOUS | Status: AC
  Administered 2018-06-22: 0.25 mg via INTRAVENOUS

## 2018-06-22 MED ORDER — ACETAMINOPHEN 325 MG PO TABS
650.0000 mg | ORAL_TABLET | Freq: Once | ORAL | Status: AC
  Administered 2018-06-22: 650 mg via ORAL

## 2018-06-22 MED ORDER — SODIUM CHLORIDE 0.9% FLUSH
10.0000 mL | INTRAVENOUS | Status: DC | PRN
  Administered 2018-06-22: 10 mL via INTRAVENOUS
  Filled 2018-06-22: qty 10

## 2018-06-22 MED ORDER — PALONOSETRON HCL INJECTION 0.25 MG/5ML
INTRAVENOUS | Status: AC
  Filled 2018-06-22: qty 5

## 2018-06-22 MED ORDER — ACETAMINOPHEN 325 MG PO TABS
ORAL_TABLET | ORAL | Status: AC
  Filled 2018-06-22: qty 2

## 2018-06-22 MED ORDER — DEXTROSE 5 % IV SOLN
Freq: Once | INTRAVENOUS | Status: AC
  Administered 2018-06-22: 10:00:00 via INTRAVENOUS
  Filled 2018-06-22: qty 250

## 2018-06-22 MED ORDER — SODIUM CHLORIDE 0.9% FLUSH
10.0000 mL | INTRAVENOUS | Status: DC | PRN
  Administered 2018-06-22: 10 mL
  Filled 2018-06-22: qty 10

## 2018-06-22 MED ORDER — CYANOCOBALAMIN 1000 MCG/ML IJ SOLN
INTRAMUSCULAR | Status: AC
  Filled 2018-06-22: qty 1

## 2018-06-22 MED ORDER — DEXAMETHASONE SODIUM PHOSPHATE 10 MG/ML IJ SOLN
10.0000 mg | Freq: Once | INTRAMUSCULAR | Status: AC
  Administered 2018-06-22: 10 mg via INTRAVENOUS

## 2018-06-22 NOTE — Patient Instructions (Signed)
[facility] Discharge Instructions for Patients Receiving Chemotherapy  Today you received the following chemotherapy agents Fam-trastuzumab (ENHERTU).  To help prevent nausea and vomiting after your treatment, we encourage you to take your nausea medication as prescribed.   If you develop nausea and vomiting that is not controlled by your nausea medication, call the clinic.   BELOW ARE SYMPTOMS THAT SHOULD BE REPORTED IMMEDIATELY:  *FEVER GREATER THAN 100.5 F  *CHILLS WITH OR WITHOUT FEVER  NAUSEA AND VOMITING THAT IS NOT CONTROLLED WITH YOUR NAUSEA MEDICATION  *UNUSUAL SHORTNESS OF BREATH  *UNUSUAL BRUISING OR BLEEDING  TENDERNESS IN MOUTH AND THROAT WITH OR WITHOUT PRESENCE OF ULCERS  *URINARY PROBLEMS  *BOWEL PROBLEMS  UNUSUAL RASH Items with * indicate a potential emergency and should be followed up as soon as possible.  Feel free to call the clinic should you have any questions or concerns. The clinic phone number is [phone].  Please show the CHEMO ALERT CARD at check-in to the Emergency Department and triage nurse.   

## 2018-06-22 NOTE — Assessment & Plan Note (Addendum)
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 MRI 02/14/2015: T5 large destructive lesion with pathologic compression fracture and extensive epidural tumor, involvement of T4, excision metastatic carcinoma ER 60%, PR 0%, HER-2 positive ratio 2.71 average copy #7.45 03/27/2015: T4-6 decompression surgery for spinal cord compression PET-CT 12/10/17Rt para-spinal mass mildy hypermetabolic SUV 5.9, lytic cortical lesion 4.8 cm lesion left prox femur suv 8.8 favor osseus met disease Echo 04/12/2017 shows well preserved ejection fraction of 55-60%  Treatment summary: 1. Resumed anastrozole 05/21/2015, but it has caused significant hair loss so we switched her to exemestane 25 mg once daily 07/23/2015 2. Herceptin every 3 week started 04/10/2015 3. Bone metastases: Zometa every 3 months-next duelate May/early June 4. Radiation therapy to the spine (Dr. [name]) 04/13/2015 to 05/19/2015 5. Radiation therapy to left femur 04/28/2016 to 05/12/2016  6. SRS to the brain lesion 11/24/2016 7.Kadcyla started 05/05/2017 discontinued 07/27/2017 8.Palbociclib +Faslodex +trastuzumab+ Pertuzumabstarted 08/10/2017, stopped 04/04/2018 9. Enhertu started 06/01/18 -------------------------------------------------------------------------------------------------------------------------- PET CT scan 04/03/2018: Multiple new bone lesions including skull, right L2, T12, left distal femur, right distal femur, right acromium.  Posterior upper chest wall lesions increase in size and nodularity.  Current treatment: Cycle 2 day 1 Enhertu (Fam-trastuzumab-Durextecan)  Echo 04/09/2018 shows EF of 55-60%.  Toxicities: 1. Dry eyes/irritation/infection: she will continue to follow with ophthalmology. 2.  UTI: she was prescribed an antibiotic to take.  I called and spoke with [place] in pharmacy and hopefully she will be able to discuss potential antibiotics that will cover the eyes and urine.  Tameca knows she can only name meds because we don't have a report of the urine sensitivities.   3. Pain: controlled right now with Gabapentin.  MRI brain and thoracic spine on 07/12/2018 as scheduled.    Return to clinic in 3 weeks for cycle 3

## 2018-06-22 NOTE — Progress Notes (Signed)
[place] [facility] Follow up:    Heather Stains, MD [place] [address]   DIAGNOSIS: Cancer Staging Primary cancer of upper outer quadrant of right female breast [facility]) Staging form: Breast, AJCC 7th Edition - Clinical: Stage IIB (T2, N1, cM0) - Unsigned Staging comments: Staged at breast conference 11.6.13  - Pathologic: Stage IV (M1) - Signed by [name], NP on 06/22/2018   SUMMARY OF ONCOLOGIC HISTORY:   Primary cancer of upper outer quadrant of right female breast ([place])   03/08/2012 Initial Diagnosis    Right breast invasive ductal carcinoma ER positive PR negative HER-2 positive Ki-67 44%; another breast mass biopsied in the anterior part of the breast which was also positive for malignancy that was HER-2 negative    03/14/2012 Cancer Staging    Staging form: Breast, AJCC 7th Edition - Pathologic: Stage IV (M1) - Signed by [name], NP on 06/22/2018    05/10/2012 Surgery    Right mastectomy and axillary lymph node dissection: Multifocal disease 5 cm, 1.7 cm, 1.6 cm, ER positive PR negative HER-2 positive Ki-67 44%, 3/7 lymph nodes positive    06/14/2012 - 06/12/2013 Chemotherapy    Adjuvant chemotherapy with [place] 6 followed by Herceptin maintenance    10/25/2012 - 12/11/2012 Radiation Therapy    Adjuvant radiation therapy    02/07/2013 -  Anti-estrogen oral therapy    Letrozole 2.5 mg daily    02/14/2015 Imaging    MRI spine: Large destructive T5 lesion with severe pathologic compression fracture and extensive epidural tumor, severe spinal stenosis with moderate cord compression, tumor involvement of T4    03/18/2015 PET scan    Residual enhancing soft tissue adjacent to the spinal cord. No evidence of metastatic disease. Nonspecific uptake the left nipple    03/27/2015 - 03/30/2015 Hospital Admission    T4-6 decompression for spinal cord compression (lower extremity paralysis)    04/10/2015 -   Chemotherapy    Palliative treatment with Herceptin every 3 weeks with letrozole 2.5 mg daily    04/13/2015 - 05/19/2015 Radiation Therapy    Palliative radiation treatment to the spine    09/01/2015 Imaging    CT chest abdomen pelvis: Pathologic fracture with posterior fusion at T5 no other evidence of metastatic disease in the chest abdomen pelvis    12/23/2015 Imaging    CT CAP: new nonspecific 0.6 cm lymph node was stated mediastinum needs follow-up CT, innumerable tiny groundglass pulmonary nodules throughout both lungs unchanged, patchy consolidation from radiation, T5 fracture, no mets    04/15/2016 PET scan    Rt para-spinal mass mildy hypermetabolic SUV 5.9, lytic cortical lesion 4.8 cm lesion left prox femur suv 8.8 favor osseus met disease    04/28/2016 - 05/12/2016 Radiation Therapy    Palliative XRT Left femur    05/24/2016 Procedure    Soft tissue mass biopsy back: Metastatic breast cancer ER 80%, PR 0%, Ki-67 50%, HER-2 positive ratio 2.25    06/09/2016 - 03/29/2017 Anti-estrogen oral therapy    Faslodex with Herceptin and Perjeta every 4 weeks    06/13/2016 - 06/15/2016 Hospital Admission    uncomplicated revision of previous thoracic instrumentation.    10/10/2016 PET scan    Interval development of new hypermetabolic 2.5 x 1.1 cm lesion posterior right eighth rib consistent with metastatic disease, stable right paraspinal lesion at T8, clustered soft tissue nodules in the posterior midline back near cervicothoracic junction smaller than before  11/03/2016 - 11/07/2016 Radiation Therapy    Palliative radiation to right eighth rib    11/04/2016 Imaging    Solitary contrast-enhancing lesion in the right cerebellum 6 x 2 mm concerning for metastatic lesion    11/24/2016 - 11/24/2016 Radiation Therapy    SRS to the brain lesion    04/18/2017 PET scan    New ill-defined rounded airspace opacity in posterior right lower lobe. Differential diagnosis includes malignancy and  infectious/inflammatory process.Increased hypermetabolic lytic lesion in T8 vertebral body, consistent with bone metastasis.  Increased hypermetabolic posterior upper chest wall subcutaneous nodules, highly suspicious metastatic disease. Further improvement in right posterior eighth rib metastasis.    05/04/2017 - 07/27/2017 Chemotherapy    Kadcyla every 3 weeks palliative chemotherapy stopped for progression    07/26/2017 Imaging    Interval increase in size of the multiple enhancing masses posteriorly overlying the spine, interval development of left axillary and mediastinal adenopathy, increase in the size of lytic lesions in T8-T9 and left ninth rib, increased groundglass opacities right lower lobe lung    07/27/2017 - 05/11/2018 Chemotherapy    Palbociclib + Faslodex + trastuzumab + Pertuzumab      04/03/2018 PET scan    PET CT scan showed progression of disease not only in the bones but also in the subcutaneous nodules.    05/15/2018 - 05/25/2018 Radiation Therapy    Palliative radiation to the back    06/01/2018 -  Chemotherapy    Enhertu (Transtuzumab Deruxtecan)      Breast cancer metastasized to bone, right ([place])   02/14/2015 Initial Diagnosis    Breast cancer metastasized to bone, right ([place])    06/01/2018 -  Chemotherapy    The patient had palonosetron (ALOXI) injection 0.25 mg, 0.25 mg, Intravenous,  Once, 2 of 5 cycles Administration: 0.25 mg (06/01/2018), 0.25 mg (06/22/2018) fam-trastuzumab deruxtecan-nxki (ENHERTU) 332 mg in dextrose 5 % 100 mL chemo infusion, 5.4 mg/kg = 332 mg, Intravenous,  Once, 2 of 5 cycles Administration: 332 mg (06/01/2018), 332 mg (06/22/2018)  for chemotherapy treatment.      CURRENT THERAPY: Enhertu  INTERVAL HISTORY: Heather [name] 73 y.o. female returns for evaluation prior to receiving treatment for her metastatic breast cancer.  She is receiving Enhertu given on day 1 of a 21 day cycle.    Infinity has had a rough month.  For three months she  has been seeing ophthalmology for an eye infection.  She has an upcoming appoingment with a specialist but is trying a round of antibiotics first.  She also has a uti and has been prescribed cipro.  She isn't sure which antibiotic to take, and wants to talk to our pharmacist today to see if there is an antibiotic that will cover both.    The week of her treatment she had nausea and had a decreased appetite.   She notes she is anxious about it because it is new.  She completed radiation in 05/2018 and feels like it helped the subcutaneous masses in her back.  She had increased fatigue after treatment.  She also would like a b12 shot today if possible.  She is sleeping moderately well at night.  She says that she wakes up in the middle of the night to urinate due to her UTI.    She is taking Gabapentin for pain that is located in her back, mostly in the middle.  She also has neuropathy in her left foot and leg that is painful.  She says  that the gabapentin helps.  She has a constant nerve pain in the left breast.    Heather [name] is scheduled for upcoming appointment for MRI of the brain and thoracic spine on 07/12/2018.   Patient Active Problem List   Diagnosis Date Noted  . Goals of care, counseling/discussion 08/10/2017  . Brain metastasis ([place]) 11/18/2016  . Genetic testing 07/11/2016  . ALLERGY TO CHLORHEXADINE, USE BETADINE ONLY 06/23/2016  . Kyphosis of thoracic region 06/13/2016  . Plantar fasciitis, bilateral 04/20/2016  . Neuropathic pain 01/27/2016  . Anemia of chronic disease 05/21/2015  . Metastatic cancer to spine ([place]) 03/27/2015  . Thrombocytopenia ([place]) 03/12/2015  . Prerenal azotemia 03/12/2015  . Pain   . Recurrent UTI--due to Klebsiella   . Paraplegia at T4 level ([place]) 02/20/2015  . Neurogenic bowel 02/20/2015  . Neurogenic bladder 02/20/2015  . Postoperative anemia due to acute blood loss 02/17/2015  . Spinal cord compression due to malignant neoplasm metastatic to spine ([place])  02/15/2015  . Epidural mass 02/15/2015  . Thoracic spine tumor   . Pathologic fracture of vertebra 02/14/2015  . Pathologic fracture of thoracic vertebrae 02/14/2015  . Breast cancer metastasized to bone, right ([place]) 02/14/2015  . Hyperlipidemia 02/14/2015  . H. pylori infection   . HTN (hypertension) 04/17/2012  . Primary cancer of upper outer quadrant of right female breast ([place]) 03/08/2012    is allergic to other; acyclovir and related; chlorhexidine; zanaflex [tizanidine hcl]; codeine; keflex [cephalexin]; naprosyn [naproxen]; oxycodone; tramadol; and tussin [guaifenesin].  MEDICAL HISTORY: Past Medical History:  Diagnosis Date  . Anemia    hx of  . Anxiety    r/t updated surgery  . Breast cancer ([place])    right  . Cataract    immature-not sure of which eye  . Complication of anesthesia    pt states she is sensitive to meds  . Dizziness    has been going on for 45month;medical MD aware  . Genetic testing 07/11/2016   Test Results: Normal; No pathogenic mutations detected  Genes Analyzed: 43 genes on [facility]'s Common Cancers panel (APC, ATM, AXIN2, BARD1, BMPR1A, BRCA1, BRCA2, BRIP1, CDH1, CDKN2A, CHEK2, DICER1, EPCAM, GREM1, HOXB13, KIT, MEN1, MLH1, MSH2, MSH6, MUTYH, NBN, NF1, PALB2, PDGFRA, PMS2, POLD1, POLE, PTEN, RAD50, RAD51C, RAD51D, SDHA, SDHB, SDHC, SDHD, SMAD4, SMARCA4, STK11, TP53, TSC1, TSC2, VHL).  .Marland KitchenGERD (gastroesophageal reflux disease)    Tums prn  . H. pylori infection   . H/O hiatal hernia   . Hemorrhoids   . History of UTI   . Hx of radiation therapy 10/25/12- 12/11/12   right chest wall/regional lymph nodes 5040 cGy, 28 sessions, right chest wall boost 1000 cGy 1 session  . Hx: UTI (urinary tract infection)   . Hyperlipidemia    but not on meds;diet and exercise controlled  . Hypertension    recently started Aldactone   . Insomnia    takes Melatoniin daily  . Metastasis to spinal column ([facility])    T5, Left  Femur cancer- radiation   . Neuropathy     "from back surgery"  . PONV (postoperative nausea and vomiting)    pt experienced hair loss, confusion and combative- 02/15/15  . Right knee pain   . Right shoulder pain   . Skin cancer    squamous, Nodule to back - "same cancer as breast."  . Urinary frequency    d/t taking Aldactone    SURGICAL HISTORY: Past Surgical History:  Procedure Laterality Date  . COLONOSCOPY    .  cyst removed from left breast  1970  . DECOMPRESSIVE LUMBAR LAMINECTOMY LEVEL 4 N/A 02/15/2015   Procedure: DECOMPRESSION T5 AND T3-T7 STABALIZATION;  Surgeon: Consuella Lose, MD;  Location: [place] [facility];  Service: Neurosurgery;  Laterality: N/A;  . ESOPHAGOGASTRODUODENOSCOPY    . LAMINECTOMY N/A 03/27/2015   Procedure: Thoracic Four-Thoracic Six Laminectomy for tumor;  Surgeon: Consuella Lose, MD;  Location: [place] [facility];  Service: Neurosurgery;  Laterality: N/A;  T4-T6 Laminectomy  . left wrist surgery   2004   with plate  . MASTECTOMY MODIFIED RADICAL  05/10/2012   Procedure: MASTECTOMY MODIFIED RADICAL;  Surgeon: Merrie Roof, MD;  Location: [facility];  Service: General;  Laterality: Right;  RIGHT MODIFIED RADICAL MASTECTOMY  . MASTECTOMY, RADICAL Right   . MINOR BREAST BIOPSY Left 05/16/2016   Procedure: EXCISION OF BACK NODULE;  Surgeon: Autumn Messing III, MD;  Location: [facility];  Service: General;  Laterality: Left;  . Nodule  back  05/17/2015  . PORTACATH PLACEMENT  05/10/2012   Procedure: INSERTION PORT-A-CATH;  Surgeon: Merrie Roof, MD;  Location: [facility];  Service: General;  Laterality: Left;    SOCIAL HISTORY: Social History   Socioeconomic History  . Marital status: Married    Spouse name: Not on file  . Number of children: 2  . Years of education: Not on file  . Highest education level: Not on file  Occupational History  . Not on file  Social Needs  . Financial resource strain: Not on file  . Food insecurity:    Worry: Not on file    Inability: Not on file  .  Transportation needs:    Medical: Not on file    Non-medical: Not on file  Tobacco Use  . Smoking status: Never Smoker  . Smokeless tobacco: Never Used  Substance and Sexual Activity  . Alcohol use: No  . Drug use: No  . Sexual activity: Yes    Birth control/protection: Post-menopausal  Lifestyle  . Physical activity:    Days per week: Not on file    Minutes per session: Not on file  . Stress: Not on file  Relationships  . Social connections:    Talks on phone: Not on file    Gets together: Not on file    Attends religious service: Not on file    Active member of club or organization: Not on file    Attends meetings of clubs or organizations: Not on file    Relationship status: Not on file  . Intimate partner violence:    Fear of current or ex partner: Not on file    Emotionally abused: Not on file    Physically abused: Not on file    Forced sexual activity: Not on file  Other Topics Concern  . Not on file  Social History Narrative  . Not on file    FAMILY HISTORY: Family History  Problem Relation Age of Onset  . Leukemia Mother 69       deceased 60  . Stroke Father   . Diabetes Mellitus II Father   . Prostate cancer Brother 55       currently 70  . Stomach cancer Maternal Grandmother 98       deceased 86  . Prostate cancer Brother 27       currently 75    Review of Systems  Constitutional: Positive for fatigue. Negative for appetite change, chills, fever and unexpected weight change.  HENT:  Negative for hearing loss, lump/mass, mouth sores and trouble swallowing.   Eyes: Positive for eye problems. Negative for icterus.  Respiratory: Negative for chest tightness, cough and shortness of breath.   Cardiovascular: Negative for chest pain, leg swelling and palpitations.  Gastrointestinal: Negative for abdominal distention, abdominal pain, constipation, diarrhea, nausea and vomiting.  Endocrine: Negative for hot flashes.  Genitourinary: Positive for frequency.  Negative for difficulty urinating.   Musculoskeletal: Negative for arthralgias.  Skin: Negative for itching and rash.  Neurological: Positive for numbness. Negative for dizziness, extremity weakness and headaches.  Hematological: Negative for adenopathy. Does not bruise/bleed easily.  Psychiatric/Behavioral: Negative for depression. The patient is not nervous/anxious.       PHYSICAL EXAMINATION  ECOG PERFORMANCE STATUS: 2 - Symptomatic, <50% confined to bed  Vitals:   06/22/18 0918  BP: (!) 149/70  Pulse: 73  Resp: 18  Temp: 97.8 F (36.6 C)  SpO2: 100%    Physical Exam Constitutional:      General: She is not in acute distress.    Appearance: Normal appearance.  HENT:     Head: Normocephalic and atraumatic.     Mouth/Throat:     Mouth: Mucous membranes are moist.     Pharynx: Oropharynx is clear. No oropharyngeal exudate or posterior oropharyngeal erythema.  Eyes:     General: No scleral icterus.    Pupils: Pupils are equal, round, and reactive to light.  Neck:     Musculoskeletal: Neck supple.  Cardiovascular:     Rate and Rhythm: Normal rate and regular rhythm.     Pulses: Normal pulses.     Heart sounds: Normal heart sounds.  Pulmonary:     Effort: Pulmonary effort is normal.     Breath sounds: Normal breath sounds.  Abdominal:     General: Abdomen is flat. There is no distension.     Palpations: Abdomen is soft.     Tenderness: There is abdominal tenderness. There is no rebound ( ).  Musculoskeletal:        General: No swelling.  Lymphadenopathy:     Cervical: No cervical adenopathy.  Skin:    General: Skin is warm and dry.     Capillary Refill: Capillary refill takes less than 2 seconds.     Findings: No rash.  Neurological:     General: No focal deficit present.     Mental Status: She is alert.  Psychiatric:        Mood and Affect: Mood normal.        Behavior: Behavior normal.     LABORATORY DATA:  CBC    Component Value Date/Time   WBC  2.6 (L) 06/22/2018 0845   WBC 6.2 07/27/2017 0836   RBC 3.96 06/22/2018 0845   HGB 11.6 (L) 06/22/2018 0845   HGB 12.0 05/05/2017 1046   HCT 36.1 06/22/2018 0845   HCT 36.9 05/05/2017 1046   PLT 210 06/22/2018 0845   PLT 210 05/05/2017 1046   MCV 91.2 06/22/2018 0845   MCV 83.1 05/05/2017 1046   MCH 29.3 06/22/2018 0845   MCHC 32.1 06/22/2018 0845   RDW 15.1 06/22/2018 0845   RDW 14.8 (H) 05/05/2017 1046   LYMPHSABS 0.4 (L) 06/22/2018 0845   LYMPHSABS 0.6 (L) 05/05/2017 1046   MONOABS 0.4 06/22/2018 0845   MONOABS 0.3 05/05/2017 1046   EOSABS 0.1 06/22/2018 0845   EOSABS 0.1 05/05/2017 1046   BASOSABS 0.1 06/22/2018 0845   BASOSABS 0.1 05/05/2017 1046    CMP  Component Value Date/Time   NA 140 06/22/2018 0845   NA 137 05/05/2017 1046   K 3.8 06/22/2018 0845   K 3.9 05/05/2017 1046   CL 105 06/22/2018 0845   CL 101 10/25/2012 0946   CO2 25 06/22/2018 0845   CO2 26 05/05/2017 1046   GLUCOSE 105 (H) 06/22/2018 0845   GLUCOSE 88 05/05/2017 1046   GLUCOSE 90 10/25/2012 0946   BUN 13 06/22/2018 0845   BUN 14.1 05/05/2017 1046   CREATININE 0.86 06/22/2018 0845   CREATININE 0.8 05/05/2017 1046   CALCIUM 9.4 06/22/2018 0845   CALCIUM 10.3 05/05/2017 1046   PROT 7.1 06/22/2018 0845   PROT 7.2 05/05/2017 1046   ALBUMIN 3.6 06/22/2018 0845   ALBUMIN 3.7 05/05/2017 1046   AST 29 06/22/2018 0845   AST 26 05/05/2017 1046   ALT 22 06/22/2018 0845   ALT 17 05/05/2017 1046   ALKPHOS 105 06/22/2018 0845   ALKPHOS 74 05/05/2017 1046   BILITOT 0.4 06/22/2018 0845   BILITOT 0.43 05/05/2017 1046   GFRNONAA >60 06/22/2018 0845   GFRAA >60 06/22/2018 0845         ASSESSMENT and THERAPY PLAN:   Primary cancer of upper outer quadrant of right female breast ([place]) Right breast invasive ductal carcinoma ER positive PR negative HER-2 positive Ki-67 44% multifocal disease 3/7 lymph nodes positive T2 N1 M0 stage IIB status post adjuvant chemotherapy with TCH followed by  Herceptin maintenance, adjuvant radiation therapy and Letrozole. MRI 02/14/2015: T5 large destructive lesion with pathologic compression fracture and extensive epidural tumor, involvement of T4, excision metastatic carcinoma ER 60%, PR 0%, HER-2 positive ratio 2.71 average copy #7.45 03/27/2015: T4-6 decompression surgery for spinal cord compression PET-CT 12/10/17Rt para-spinal mass mildy hypermetabolic SUV 5.9, lytic cortical lesion 4.8 cm lesion left prox femur suv 8.8 favor osseus met disease Echo 04/12/2017 shows well preserved ejection fraction of 55-60%  Treatment summary: 1. Resumed anastrozole 05/21/2015, but it has caused significant hair loss so we switched her to exemestane 25 mg once daily 07/23/2015 2. Herceptin every 3 week started 04/10/2015 3. Bone metastases: Zometa every 3 months-next duelate May/early June 4. Radiation therapy to the spine (Dr. [name]) 04/13/2015 to 05/19/2015 5. Radiation therapy to left femur 04/28/2016 to 05/12/2016  6. SRS to the brain lesion 11/24/2016 7.Kadcyla started 05/05/2017 discontinued 07/27/2017 8.Palbociclib +Faslodex +trastuzumab+ Pertuzumabstarted 08/10/2017, stopped 04/04/2018 9. Enhertu started 06/01/18 -------------------------------------------------------------------------------------------------------------------------- PET CT scan 04/03/2018: Multiple new bone lesions including skull, right L2, T12, left distal femur, right distal femur, right acromium.  Posterior upper chest wall lesions increase in size and nodularity.  Current treatment: Cycle 2 day 1 Enhertu (Fam-trastuzumab-Durextecan)  Echo 04/09/2018 shows EF of 55-60%.  Toxicities: 1. Dry eyes/irritation/infection: she will continue to follow with ophthalmology. 2. UTI: she was prescribed an antibiotic to take.  I called and spoke with [place] in pharmacy and hopefully she will be able to discuss potential antibiotics that will cover the eyes and urine.  Analena knows she  can only name meds because we don't have a report of the urine sensitivities.   3. Pain: controlled right now with Gabapentin.  MRI brain and thoracic spine on 07/12/2018 as scheduled.    Return to clinic in 3 weeks for cycle 3   All questions were answered. The patient knows to call the clinic with any problems, questions or concerns. We can certainly see the patient much sooner if necessary.  A total of (30) minutes of face-to-face time was spent with this  patient with greater than 50% of that time in counseling and care-coordination.  This note was electronically signed. Scot Dock, NP 06/22/2018

## 2018-06-22 NOTE — Telephone Encounter (Signed)
No los °

## 2018-07-04 ENCOUNTER — Inpatient Hospital Stay: Admission: RE | Admit: 2018-07-04 | Payer: Self-pay | Source: Ambulatory Visit | Admitting: Urology

## 2018-07-05 ENCOUNTER — Inpatient Hospital Stay: Admission: RE | Admit: 2018-07-05 | Payer: Medicare Other | Source: Ambulatory Visit | Admitting: Urology

## 2018-07-09 ENCOUNTER — Other Ambulatory Visit: Payer: Self-pay | Admitting: Radiation Therapy

## 2018-07-11 DIAGNOSIS — H04121 Dry eye syndrome of right lacrimal gland: Secondary | ICD-10-CM | POA: Diagnosis not present

## 2018-07-11 DIAGNOSIS — H04122 Dry eye syndrome of left lacrimal gland: Secondary | ICD-10-CM | POA: Diagnosis not present

## 2018-07-11 DIAGNOSIS — H35372 Puckering of macula, left eye: Secondary | ICD-10-CM | POA: Diagnosis not present

## 2018-07-11 DIAGNOSIS — H2513 Age-related nuclear cataract, bilateral: Secondary | ICD-10-CM | POA: Diagnosis not present

## 2018-07-11 DIAGNOSIS — H04123 Dry eye syndrome of bilateral lacrimal glands: Secondary | ICD-10-CM | POA: Diagnosis not present

## 2018-07-11 DIAGNOSIS — H02831 Dermatochalasis of right upper eyelid: Secondary | ICD-10-CM | POA: Diagnosis not present

## 2018-07-11 NOTE — Progress Notes (Signed)
Patient Care Team: [name], MD as PCP - General (Family Medicine)  DIAGNOSIS:    ICD-10-CM   1. Breast cancer metastasized to bone, right ([place]) C50.911    C79.51     SUMMARY OF ONCOLOGIC HISTORY:   Primary cancer of upper outer quadrant of right female breast ([place])   03/08/2012 Initial Diagnosis    Right breast invasive ductal carcinoma ER positive PR negative HER-2 positive Ki-67 44%; another breast mass biopsied in the anterior part of the breast which was also positive for malignancy that was HER-2 negative    03/14/2012 Cancer Staging    Staging form: Breast, AJCC 7th Edition - Pathologic: Stage IV (M1) - Signed by [name], NP on 06/22/2018    05/10/2012 Surgery    Right mastectomy and axillary lymph node dissection: Multifocal disease 5 cm, 1.7 cm, 1.6 cm, ER positive PR negative HER-2 positive Ki-67 44%, 3/7 lymph nodes positive    06/14/2012 - 06/12/2013 Chemotherapy    Adjuvant chemotherapy with [address] 6 followed by Herceptin maintenance    10/25/2012 - 12/11/2012 Radiation Therapy    Adjuvant radiation therapy    02/07/2013 -  Anti-estrogen oral therapy    Letrozole 2.5 mg daily    02/14/2015 Imaging    MRI spine: Large destructive T5 lesion with severe pathologic compression fracture and extensive epidural tumor, severe spinal stenosis with moderate cord compression, tumor involvement of T4    03/18/2015 PET scan    Residual enhancing soft tissue adjacent to the spinal cord. No evidence of metastatic disease. Nonspecific uptake the left nipple    03/27/2015 - 03/30/2015 Hospital Admission    T4-6 decompression for spinal cord compression (lower extremity paralysis)    04/10/2015 -  Chemotherapy    Palliative treatment with Herceptin every 3 weeks with letrozole 2.5 mg daily    04/13/2015 - 05/19/2015 Radiation Therapy    Palliative radiation treatment to the spine    09/01/2015 Imaging    CT chest abdomen pelvis: Pathologic fracture with posterior  fusion at T5 no other evidence of metastatic disease in the chest abdomen pelvis    12/23/2015 Imaging    CT CAP: new nonspecific 0.6 cm lymph node was stated mediastinum needs follow-up CT, innumerable tiny groundglass pulmonary nodules throughout both lungs unchanged, patchy consolidation from radiation, T5 fracture, no mets    04/15/2016 PET scan    Rt para-spinal mass mildy hypermetabolic SUV 5.9, lytic cortical lesion 4.8 cm lesion left prox femur suv 8.8 favor osseus met disease    04/28/2016 - 05/12/2016 Radiation Therapy    Palliative XRT Left femur    05/24/2016 Procedure    Soft tissue mass biopsy back: Metastatic breast cancer ER 80%, PR 0%, Ki-67 50%, HER-2 positive ratio 2.25    06/09/2016 - 03/29/2017 Anti-estrogen oral therapy    Faslodex with Herceptin and Perjeta every 4 weeks    06/13/2016 - 06/15/2016 Hospital Admission    uncomplicated revision of previous thoracic instrumentation.    10/10/2016 PET scan    Interval development of new hypermetabolic 2.5 x 1.1 cm lesion posterior right eighth rib consistent with metastatic disease, stable right paraspinal lesion at T8, clustered soft tissue nodules in the posterior midline back near cervicothoracic junction smaller than before     11/03/2016 - 11/07/2016 Radiation Therapy    Palliative radiation to right eighth rib    11/04/2016 Imaging    Solitary contrast-enhancing lesion in the right cerebellum 6 x 2 mm concerning for metastatic lesion  11/24/2016 - 11/24/2016 Radiation Therapy    SRS to the brain lesion    04/18/2017 PET scan    New ill-defined rounded airspace opacity in posterior right lower lobe. Differential diagnosis includes malignancy and infectious/inflammatory process.Increased hypermetabolic lytic lesion in T8 vertebral body, consistent with bone metastasis.  Increased hypermetabolic posterior upper chest wall subcutaneous nodules, highly suspicious metastatic disease. Further improvement in right posterior eighth  rib metastasis.    05/04/2017 - 07/27/2017 Chemotherapy    Kadcyla every 3 weeks palliative chemotherapy stopped for progression    07/26/2017 Imaging    Interval increase in size of the multiple enhancing masses posteriorly overlying the spine, interval development of left axillary and mediastinal adenopathy, increase in the size of lytic lesions in T8-T9 and left ninth rib, increased groundglass opacities right lower lobe lung    07/27/2017 - 05/11/2018 Chemotherapy    Palbociclib + Faslodex + trastuzumab + Pertuzumab      04/03/2018 PET scan    PET CT scan showed progression of disease not only in the bones but also in the subcutaneous nodules.    05/15/2018 - 05/25/2018 Radiation Therapy    Palliative radiation to the back    06/01/2018 -  Chemotherapy    Enhertu (Transtuzumab Deruxtecan)      Breast cancer metastasized to bone, right ([place])   02/14/2015 Initial Diagnosis    Breast cancer metastasized to bone, right ([place])    06/01/2018 -  Chemotherapy    The patient had palonosetron (ALOXI) injection 0.25 mg, 0.25 mg, Intravenous,  Once, 3 of 5 cycles Administration: 0.25 mg (06/01/2018), 0.25 mg (06/22/2018) fam-trastuzumab deruxtecan-nxki (ENHERTU) 332 mg in dextrose 5 % 100 mL chemo infusion, 5.4 mg/kg = 332 mg, Intravenous,  Once, 3 of 5 cycles Administration: 332 mg (06/01/2018), 332 mg (06/22/2018)  for chemotherapy treatment.      CHIEF COMPLIANT: Cycle 3 Enhertu  INTERVAL HISTORY: Heather [name] is a 73 y.o. with above-mentioned history of metastatic breast cancer who is here for cycle 2 of chemotherapy with trastuzumab-Deruxtecan (Enhertu). An MRI of her thoracic spine and brain from 07/12/18 showed no brain metastases, three skull metastases that range from 5-18m and are stable to slightly increased since her 04/2018 scan, enlarged left T12 vertebral metastasis, and regression of the posterior subcutaneous nodular metastases. She presents to the clinic today with her husband. She  notes severe fatigue, and is interested in a B-12 injection, and 10 days of constipation, for which she has used Miralax. She notes dry eyes and eye infections over the past few months, for which she has used steroid eye drops, and for which her ophthalmologist thinks the cause is radiation. She reports significant hair loss. Her labs from today show: WBC 2.5, Hg 11.9, platelets 175, ANC 1.5. She asked if Benadryl could be removed from her treatment today as she does not like the side effects.   REVIEW OF SYSTEMS:   Constitutional: Denies fevers, chills or abnormal weight loss (+) severe fatigue (+) hair loss Eyes: Denies blurriness of vision (+) dry eyes Ears, nose, mouth, throat, and face: Denies mucositis or sore throat Respiratory: Denies cough, dyspnea or wheezes Cardiovascular: Denies palpitation, chest discomfort Gastrointestinal: Denies nausea, heartburn (+) constipation  Skin: Denies abnormal skin rashes Lymphatics: Denies new lymphadenopathy or easy bruising Neurological: Denies numbness, tingling or new weaknesses Behavioral/Psych: Mood is stable, no new changes  Extremities: No lower extremity edema Breast: denies any pain or lumps or nodules in either breasts All other systems were reviewed with  the patient and are negative.  I have reviewed the past medical history, past surgical history, social history and family history with the patient and they are unchanged from previous note.  ALLERGIES:  is allergic to other; acyclovir and related; chlorhexidine; zanaflex [tizanidine hcl]; codeine; keflex [cephalexin]; naprosyn [naproxen]; oxycodone; tramadol; and tussin [guaifenesin].  MEDICATIONS:  Current Outpatient Medications  Medication Sig Dispense Refill  . ALPRAZolam (XANAX) 0.25 MG tablet Take 1 tablet by mouth as needed.    Marland Kitchen b complex vitamins tablet Take 1 tablet by mouth daily.    . Calcium Carbonate Antacid (TUMS PO) Take 2 tablets by mouth 2 (two) times daily as needed  (acid reflux).    . Cholecalciferol (VITAMIN D) 2000 UNITS tablet Take 2,000 Units by mouth daily.    . ciprofloxacin (CIPRO) 500 MG tablet     . Coenzyme Q10 (COQ10) 100 MG CAPS Take 100 mg by mouth as needed. Days that she takes pravastatin MWF.    Marland Kitchen doxycycline (VIBRAMYCIN) 100 MG capsule     . fluticasone (FLONASE) 50 MCG/ACT nasal spray Place 1 spray into both nostrils daily as needed.    . gabapentin (NEURONTIN) 600 MG tablet Take 1 tablet (600 mg total) by mouth as directed. Take 1 tablet in the AM and afternoon and 1 1/2 tabs at bedtime (Patient taking differently: Take 600 mg by mouth 3 (three) times daily. Take 1 tablet in the AM and afternoon and 1 1/2 tabs at bedtime) 315 tablet 2  . HYDROcodone-acetaminophen (NORCO/VICODIN) 5-325 MG tablet Take 1 tablet by mouth every 6 (six) hours as needed for moderate pain. (Patient not taking: Reported on 04/09/2018) 30 tablet 0  . lidocaine-prilocaine (EMLA) cream Apply to affected area once (Patient taking differently: Apply 1 application topically every 30 (thirty) days. Apply to port prior to infusions) 30 g 3  . omeprazole (PRILOSEC) 40 MG capsule TAKE 1 CAPSULE BY MOUTH ONCE DAILY FOR 30 DAYS  5  . ondansetron (ZOFRAN) 8 MG tablet Take 1 tablet (8 mg total) by mouth every 8 (eight) hours as needed for nausea or vomiting. 20 tablet 0  . OVER THE COUNTER MEDICATION Place 1 drop into both eyes 2 (two) times daily as needed (dry eyes). Over the counter lubricating eye drops    . polyethylene glycol (MIRALAX / GLYCOLAX) packet Take 17 g by mouth daily as needed.    . Probiotic Product (PROBIOTIC DAILY PO) Take 1 capsule by mouth daily.     Marland Kitchen spironolactone (ALDACTONE) 25 MG tablet Take 25 mg by mouth daily.    . tamsulosin (FLOMAX) 0.4 MG CAPS capsule     . tobramycin-dexamethasone (TOBRADEX) ophthalmic solution Place 1 drop into both eyes every 6 (six) hours. (Patient not taking: Reported on 04/09/2018) 5 mL 0   No current facility-administered  medications for this visit.    Facility-Administered Medications Ordered in Other Visits  Medication Dose Route Frequency Provider Last Rate Last Dose  . heparin lock flush 100 unit/mL  500 Units Intracatheter Once PRN [name], MD      . sodium chloride 0.9 % injection 10 mL  10 mL Intravenous PRN [name], MD   10 mL at 04/26/17 0828  . sodium chloride flush (NS) 0.9 % injection 10 mL  10 mL Intracatheter PRN [name], MD        PHYSICAL EXAMINATION: ECOG PERFORMANCE STATUS: 2 - Symptomatic, <50% confined to bed  Vitals:   07/13/18 0817  BP: (!) 155/63  Pulse:  81  Resp: (!) 98  Temp: 97.7 F (36.5 C)  SpO2: 100%   Filed Weights   07/13/18 0817  Weight: 132 lb 3.2 oz (60 kg)    GENERAL: alert, no distress and comfortable SKIN: skin color, texture, turgor are normal, no rashes or significant lesions EYES: normal, Conjunctiva are pink and non-injected, sclera clear OROPHARYNX: no exudate, no erythema and lips, buccal mucosa, and tongue normal  NECK: supple, thyroid normal size, non-tender, without nodularity LYMPH: no palpable lymphadenopathy in the cervical, axillary or inguinal LUNGS: clear to auscultation and percussion with normal breathing effort HEART: regular rate & rhythm and no murmurs and no lower extremity edema ABDOMEN: abdomen soft, non-tender and normal bowel sounds MUSCULOSKELETAL: no cyanosis of digits and no clubbing  NEURO: alert & oriented x 3 with fluent speech, no focal motor/sensory deficits EXTREMITIES: No lower extremity edema  LABORATORY DATA:  I have reviewed the data as listed CMP Latest Ref Rng & Units 06/22/2018 06/07/2018 06/01/2018  Glucose 70 - 99 mg/dL 105(H) 147(H) 98  BUN 8 - 23 mg/dL _0 Creatinine 0.44 - 1.00 mg/dL 0.86 1.00 0.80  Sodium 135 - 145 mmol/L 140 135 140  Potassium 3.5 - 5.1 mmol/L 3.8 3.7 4.0  Chloride 98 - 111 mmol/L 105 99 103  CO2 22 - 32 mmol/L _1 Calcium 8.9 - 10.3 mg/dL 9.4 10.4(H) 10.7(H)   Total Protein 6.5 - 8.1 g/dL 7.1 7.4 7.3  Total Bilirubin 0.3 - 1.2 mg/dL 0.4 0.6 0.5  Alkaline Phos 38 - 126 U/L 105 98 98  AST 15 - 41 U/L _2 ALT 0 - 44 U/L _3 Lab Results  Component Value Date   WBC 2.5 (L) 07/13/2018   HGB 11.9 (L) 07/13/2018   HCT 36.9 07/13/2018   MCV 91.1 07/13/2018   PLT 175 07/13/2018   NEUTROABS 1.5 (L) 07/13/2018    ASSESSMENT & PLAN:  Breast cancer metastasized to bone, right ([facility]) Right breast invasive ductal carcinoma ER positive PR negative HER-2 positive Ki-67 44% multifocal disease 3/7 lymph nodes positive T2 N1 M0 stage IIB status post adjuvant chemotherapy with TCH followed by Herceptin maintenance, adjuvant radiation therapy and Letrozole. MRI 02/14/2015: T5 large destructive lesion with pathologic compression fracture and extensive epidural tumor, involvement of T4, excision metastatic carcinoma ER 60%, PR 0%, HER-2 positive ratio 2.71 average copy #7.45 03/27/2015: T4-6 decompression surgery for spinal cord compression PET-CT 12/10/17Rt para-spinal mass mildy hypermetabolic SUV 5.9, lytic cortical lesion 4.8 cm lesion left prox femur suv 8.8 favor osseus met disease Echo 04/12/2017 shows well preserved ejection fraction of 55-60%  Treatment summary: 1. Resumed anastrozole 05/21/2015, but it has caused significant hair loss so we switched her to exemestane 25 mg once daily 07/23/2015 2. Herceptin every 3 week started 04/10/2015 3. Bone metastases: Zometa every 3 months-next duelate May/early June 4. Radiation therapy to the spine (Dr. [name]) 04/13/2015 to 05/19/2015 5. Radiation therapy to left femur 04/28/2016 to 05/12/2016  6. SRS to the brain lesion 11/24/2016 7.Kadcyla started 05/05/2017 discontinued 07/27/2017 8.Palbociclib +Faslodex +trastuzumab+ Pertuzumabstarted 08/10/2017, stopped 04/04/2018 9. Enhertu started  06/01/18 -------------------------------------------------------------------------------------------------------------------------- PET CT scan 04/03/2018: Multiple new bone lesions including skull, right L2, T12, left distal femur, right distal femur, right acromium. Posterior upper chest wall lesions increase in size and nodularity.  Current treatment: Cycle  3 day 1 Enhertu (Fam-trastuzumab-Durextecan) Echo 04/09/2018 shows EF of 55-60%.  Toxicities: 1. Dry eyes/irritation/infection: she will continue to  follow with ophthalmology. 2.  Severe fatigue 3. Pain: controlled right now with Gabapentin. 4.  Hair loss '  MRI brain and thoracic spine on 07/12/2018:  No parenchymal metastases.  3 skull metastases 5 to 14 mm stable to slightly increased since December MRI thoracic spine: Enlarged left paracentral T12 vertebral body metastases 20 mm, posterior subcutaneous meds appear regressed  Our plan is to do scans after 4 cycles.  We discussed different treatment options including neratinib with capecitabine as an alternative treatment option if she cannot tolerate this treatment or if she progresses on this treatment.  Return to clinic in 3 weeks for cycle 4    No orders of the defined types were placed in this encounter.  The patient has a good understanding of the overall plan. she agrees with it. she will call with any problems that may develop before the next visit here.  [name], MD 07/13/2018  Julious Oka Dorshimer am acting as scribe for Dr. [name].  I have reviewed the above documentation for accuracy and completeness, and I agree with the above.

## 2018-07-12 ENCOUNTER — Ambulatory Visit
Admission: RE | Admit: 2018-07-12 | Discharge: 2018-07-12 | Disposition: A | Discharge status: Home / Self Care | Payer: Medicare Other | Source: Ambulatory Visit | Attending: Radiation Oncology | Admitting: Radiation Oncology

## 2018-07-12 DIAGNOSIS — C7931 Secondary malignant neoplasm of brain: Secondary | ICD-10-CM

## 2018-07-12 DIAGNOSIS — C50919 Malignant neoplasm of unspecified site of unspecified female breast: Secondary | ICD-10-CM | POA: Diagnosis not present

## 2018-07-12 DIAGNOSIS — C7949 Secondary malignant neoplasm of other parts of nervous system: Secondary | ICD-10-CM

## 2018-07-12 DIAGNOSIS — C7951 Secondary malignant neoplasm of bone: Secondary | ICD-10-CM | POA: Diagnosis not present

## 2018-07-12 MED ORDER — GADOBENATE DIMEGLUMINE 529 MG/ML IV SOLN
12.0000 mL | Freq: Once | INTRAVENOUS | Status: AC | PRN
  Administered 2018-07-12: 12 mL via INTRAVENOUS

## 2018-07-13 ENCOUNTER — Inpatient Hospital Stay: Payer: Medicare Other

## 2018-07-13 ENCOUNTER — Inpatient Hospital Stay: Payer: Medicare Other | Attending: Hematology and Oncology | Admitting: Hematology and Oncology

## 2018-07-13 DIAGNOSIS — Z923 Personal history of irradiation: Secondary | ICD-10-CM | POA: Diagnosis not present

## 2018-07-13 DIAGNOSIS — C7951 Secondary malignant neoplasm of bone: Secondary | ICD-10-CM

## 2018-07-13 DIAGNOSIS — R6889 Other general symptoms and signs: Secondary | ICD-10-CM | POA: Diagnosis not present

## 2018-07-13 DIAGNOSIS — Z9221 Personal history of antineoplastic chemotherapy: Secondary | ICD-10-CM | POA: Diagnosis not present

## 2018-07-13 DIAGNOSIS — Z9011 Acquired absence of right breast and nipple: Secondary | ICD-10-CM

## 2018-07-13 DIAGNOSIS — Z5112 Encounter for antineoplastic immunotherapy: Secondary | ICD-10-CM | POA: Insufficient documentation

## 2018-07-13 DIAGNOSIS — Z79811 Long term (current) use of aromatase inhibitors: Secondary | ICD-10-CM | POA: Insufficient documentation

## 2018-07-13 DIAGNOSIS — C50911 Malignant neoplasm of unspecified site of right female breast: Secondary | ICD-10-CM

## 2018-07-13 DIAGNOSIS — R5383 Other fatigue: Secondary | ICD-10-CM | POA: Diagnosis not present

## 2018-07-13 DIAGNOSIS — Z79899 Other long term (current) drug therapy: Secondary | ICD-10-CM | POA: Insufficient documentation

## 2018-07-13 DIAGNOSIS — C7931 Secondary malignant neoplasm of brain: Secondary | ICD-10-CM | POA: Diagnosis not present

## 2018-07-13 DIAGNOSIS — C50411 Malignant neoplasm of upper-outer quadrant of right female breast: Secondary | ICD-10-CM

## 2018-07-13 DIAGNOSIS — Z95828 Presence of other vascular implants and grafts: Secondary | ICD-10-CM

## 2018-07-13 DIAGNOSIS — Z170 Estrogen receptor positive status [ER+]: Secondary | ICD-10-CM | POA: Diagnosis not present

## 2018-07-13 DIAGNOSIS — G9529 Other cord compression: Secondary | ICD-10-CM

## 2018-07-13 LAB — CBC WITH DIFFERENTIAL (CANCER CENTER ONLY)
Abs Immature Granulocytes: 0.01 10*3/uL (ref 0.00–0.07)
Basophils Absolute: 0 10*3/uL (ref 0.0–0.1)
Basophils Relative: 2 %
Eosinophils Absolute: 0.2 10*3/uL (ref 0.0–0.5)
Eosinophils Relative: 6 %
HCT: 36.9 % (ref 36.0–46.0)
Hemoglobin: 11.9 g/dL — ABNORMAL LOW (ref 12.0–15.0)
Immature Granulocytes: 0 %
Lymphocytes Relative: 14 %
Lymphs Abs: 0.4 10*3/uL — ABNORMAL LOW (ref 0.7–4.0)
MCH: 29.4 pg (ref 26.0–34.0)
MCHC: 32.2 g/dL (ref 30.0–36.0)
MCV: 91.1 fL (ref 80.0–100.0)
Monocytes Absolute: 0.4 10*3/uL (ref 0.1–1.0)
Monocytes Relative: 17 %
Neutro Abs: 1.5 10*3/uL — ABNORMAL LOW (ref 1.7–7.7)
Neutrophils Relative %: 61 %
Platelet Count: 175 10*3/uL (ref 150–400)
RBC: 4.05 MIL/uL (ref 3.87–5.11)
RDW: 16.4 % — ABNORMAL HIGH (ref 11.5–15.5)
WBC Count: 2.5 10*3/uL — ABNORMAL LOW (ref 4.0–10.5)
nRBC: 0 % (ref 0.0–0.2)

## 2018-07-13 LAB — COMPREHENSIVE METABOLIC PANEL
ALT: 25 U/L (ref 0–44)
AST: 36 U/L (ref 15–41)
Albumin: 3.7 g/dL (ref 3.5–5.0)
Alkaline Phosphatase: 80 U/L (ref 38–126)
Anion gap: 7 (ref 5–15)
BUN: 10 mg/dL (ref 8–23)
CO2: 28 mmol/L (ref 22–32)
Calcium: 9.7 mg/dL (ref 8.9–10.3)
Chloride: 102 mmol/L (ref 98–111)
Creatinine, Ser: 0.84 mg/dL (ref 0.44–1.00)
GFR calc Af Amer: >60 mL/min (ref >60)
GFR calc non Af Amer: >60 mL/min (ref >60)
Glucose, Bld: 87 mg/dL (ref 70–99)
Potassium: 3.9 mmol/L (ref 3.5–5.1)
Sodium: 137 mmol/L (ref 135–145)
Total Bilirubin: 0.3 mg/dL (ref 0.3–1.2)
Total Protein: 7 g/dL (ref 6.5–8.1)

## 2018-07-13 MED ORDER — SODIUM CHLORIDE 0.9% FLUSH
10.0000 mL | Freq: Once | INTRAVENOUS | Status: AC
  Administered 2018-07-13: 10 mL
  Filled 2018-07-13: qty 10

## 2018-07-13 MED ORDER — ZOLEDRONIC ACID 4 MG/5ML IV CONC: 4.0000 mg | Freq: Once | INTRAVENOUS | Status: DC

## 2018-07-13 MED ORDER — ACETAMINOPHEN 325 MG PO TABS
650.0000 mg | ORAL_TABLET | Freq: Once | ORAL | Status: AC
  Administered 2018-07-13: 650 mg via ORAL

## 2018-07-13 MED ORDER — DEXAMETHASONE SODIUM PHOSPHATE 10 MG/ML IJ SOLN
INTRAMUSCULAR | Status: AC
  Filled 2018-07-13: qty 1

## 2018-07-13 MED ORDER — DEXTROSE 5 % IV SOLN
Freq: Once | INTRAVENOUS | Status: AC
  Administered 2018-07-13: 09:00:00 via INTRAVENOUS
  Filled 2018-07-13: qty 250

## 2018-07-13 MED ORDER — CYANOCOBALAMIN 1000 MCG/ML IJ SOLN
INTRAMUSCULAR | Status: AC
  Filled 2018-07-13: qty 1

## 2018-07-13 MED ORDER — HEPARIN SOD (PORK) LOCK FLUSH 100 UNIT/ML IV SOLN
500.0000 [IU] | Freq: Once | INTRAVENOUS | Status: AC | PRN
  Administered 2018-07-13: 500 [IU]
  Filled 2018-07-13: qty 5

## 2018-07-13 MED ORDER — CYANOCOBALAMIN 1000 MCG/ML IJ SOLN
1000.0000 ug | Freq: Once | INTRAMUSCULAR | Status: AC
  Administered 2018-07-13: 1000 ug via INTRAMUSCULAR

## 2018-07-13 MED ORDER — PALONOSETRON HCL INJECTION 0.25 MG/5ML
INTRAVENOUS | Status: AC
  Filled 2018-07-13: qty 5

## 2018-07-13 MED ORDER — SODIUM CHLORIDE 0.9% FLUSH
10.0000 mL | INTRAVENOUS | Status: DC | PRN
  Administered 2018-07-13: 10 mL
  Filled 2018-07-13: qty 10

## 2018-07-13 MED ORDER — PALONOSETRON HCL INJECTION 0.25 MG/5ML
0.2500 mg | Freq: Once | INTRAVENOUS | Status: AC
  Administered 2018-07-13: 0.25 mg via INTRAVENOUS

## 2018-07-13 MED ORDER — DEXAMETHASONE SODIUM PHOSPHATE 10 MG/ML IJ SOLN
10.0000 mg | Freq: Once | INTRAMUSCULAR | Status: AC
  Administered 2018-07-13: 10 mg via INTRAVENOUS

## 2018-07-13 MED ORDER — FAM-TRASTUZUMAB DERUXTECAN-NXKI CHEMO 100 MG IV SOLR
5.4000 mg/kg | Freq: Once | INTRAVENOUS | Status: AC
  Administered 2018-07-13: 332 mg via INTRAVENOUS
  Filled 2018-07-13: qty 16.6

## 2018-07-13 MED ORDER — ACETAMINOPHEN 325 MG PO TABS
ORAL_TABLET | ORAL | Status: AC
  Filled 2018-07-13: qty 2

## 2018-07-13 NOTE — Patient Instructions (Signed)
[facility] Discharge Instructions for Patients Receiving Chemotherapy  Today you received the following chemotherapy agents Fam-trastuzumab (ENHERTU).  To help prevent nausea and vomiting after your treatment, we encourage you to take your nausea medication as prescribed.   If you develop nausea and vomiting that is not controlled by your nausea medication, call the clinic.   BELOW ARE SYMPTOMS THAT SHOULD BE REPORTED IMMEDIATELY:  *FEVER GREATER THAN 100.5 F  *CHILLS WITH OR WITHOUT FEVER  NAUSEA AND VOMITING THAT IS NOT CONTROLLED WITH YOUR NAUSEA MEDICATION  *UNUSUAL SHORTNESS OF BREATH  *UNUSUAL BRUISING OR BLEEDING  TENDERNESS IN MOUTH AND THROAT WITH OR WITHOUT PRESENCE OF ULCERS  *URINARY PROBLEMS  *BOWEL PROBLEMS  UNUSUAL RASH Items with * indicate a potential emergency and should be followed up as soon as possible.  Feel free to call the clinic should you have any questions or concerns. The clinic phone number is [phone].  Please show the CHEMO ALERT CARD at check-in to the Emergency Department and triage nurse.   

## 2018-07-13 NOTE — Assessment & Plan Note (Signed)
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 MRI 02/14/2015: T5 large destructive lesion with pathologic compression fracture and extensive epidural tumor, involvement of T4, excision metastatic carcinoma ER 60%, PR 0%, HER-2 positive ratio 2.71 average copy #7.45 03/27/2015: T4-6 decompression surgery for spinal cord compression PET-CT 12/10/17Rt para-spinal mass mildy hypermetabolic SUV 5.9, lytic cortical lesion 4.8 cm lesion left prox femur suv 8.8 favor osseus met disease Echo 04/12/2017 shows well preserved ejection fraction of 55-60%  Treatment summary: 1. Resumed anastrozole 05/21/2015, but it has caused significant hair loss so we switched her to exemestane 25 mg once daily 07/23/2015 2. Herceptin every 3 week started 04/10/2015 3. Bone metastases: Zometa every 3 months-next duelate May/early June 4. Radiation therapy to the spine (Dr. [name]) 04/13/2015 to 05/19/2015 5. Radiation therapy to left femur 04/28/2016 to 05/12/2016  6. SRS to the brain lesion 11/24/2016 7.Kadcyla started 05/05/2017 discontinued 07/27/2017 8.Palbociclib +Faslodex +trastuzumab+ Pertuzumabstarted 08/10/2017, stopped 04/04/2018 9. Enhertu started 06/01/18 -------------------------------------------------------------------------------------------------------------------------- PET CT scan 04/03/2018: Multiple new bone lesions including skull, right L2, T12, left distal femur, right distal femur, right acromium. Posterior upper chest wall lesions increase in size and nodularity.  Current treatment: Cycle 3 day 1 Enhertu (Fam-trastuzumab-Durextecan) Echo 04/09/2018 shows EF of 55-60%.  Toxicities: 1. Dry eyes/irritation/infection: she will continue to follow with ophthalmology. 2.  UTI: Resolved 3. Pain: controlled right now with Gabapentin.   MRI brain and thoracic spine on 07/12/2018:  No parenchymal metastases.  3 skull metastases 5 to 14 mm stable to slightly increased since December MRI thoracic spine: Enlarged left paracentral T12 vertebral body metastases 20 mm, posterior subcutaneous meds appear regressed  Return to clinic in 3 weeks for cycle 4

## 2018-07-17 ENCOUNTER — Ambulatory Visit
Admission: RE | Admit: 2018-07-17 | Discharge: 2018-07-17 | Disposition: A | Discharge status: Home / Self Care | Payer: Medicare Other | Source: Ambulatory Visit | Attending: Urology | Admitting: Urology

## 2018-07-17 VITALS — BP 130/69 | HR 88 | Temp 97.7°F | Resp 20 | Ht 64.0 in | Wt 134.2 lb

## 2018-07-17 DIAGNOSIS — C7951 Secondary malignant neoplasm of bone: Secondary | ICD-10-CM

## 2018-07-17 NOTE — Progress Notes (Signed)
Radiation Oncology         [phone] ________________________________  Name: Heather [name] MRN: [id number]  Date: 07/17/2018  DOB: July 13, 1945  Post-treatment Note  CC: Heather Stains, MD  Heather Lose, MD  Diagnosis:   73 y.o. woman with ER positive, PR negative, HER-2 positive metastatic breast cancer with multifocal metastasis involving the spine, bony skeleton, lung, and brain.   Interval Since Last Radiation: 1 year  05/14/2018 - 05/25/2018:  Chest Wall, Right Upper Posterior subcutaneous lesions / treated to 30 Gy delivered in 10 fractions of 3 Gy  05/18/2017 - 05/31/2017:  The thoracic spine (T7-T9) was treated to 30 Gy in 10 fractions of 3 Gy.  6/27-7/18/18 SRS and palliative treatment: 1.  The rib 8th was treated to 30 Gy in 10 fractions 2.  Right cerebellar 6 mm target was treated using 3 Dynamic Conformal Arcs to a prescription dose of 20 Gy in one fraction  04/27/16-05/12/16: Left femur/ 30 Gy in 10 fractions  04/13/2015-05/19/2015 Postop:  55 Gy in 25 fractions of 2.2 Gy to the thoracic spine  10/25/12-12/11/12: 50.4 Gy to the right chest wall and regional lymph nodes in 28 fractions with a 10 Gy boost to the right chest wall in 1 session.  Narrative: Heather [name] is a well known patient to our service with a history of metastatic breast cancer originally treated in 2014 to the chest wall and regional lymph nodes. She developed recurrent disease within the thoracic spine and subsequently has undergone 3 surgeries, in addition to radiotherapy, with her most recent surgery being on 06/13/2016 where she had removal of thoracic hardware and replacement of pedicle screws with stabilization.  Of note previously placed screws between T3, T4 T6 and T7 were identified and dissection was carried out inferiorly to T8, T9-T10 and T11, T8 through T11 pedicle screw trajectories were placed bilaterally. She recovered well since treatment to her spine as she had been paralyzed in 2016, and with  rigorous therapy has been able to make an impressive recovery. She continues on systemic therapy under the care of [name] and also follows along in the brain and spine oncology conference. PET scan on 10/10/16 revealed a new hypermetabolic 2.5 x 1.1 cm lesion in the posterior right 8th rib. She also mentioned having new onset headaches, dizziness, and an episode of visual change,so an MRI brain was performed 11/04/16 demonstrating a solitary contrast-enhancing lesion within the right cerebellum, measuring 6 x 2 mm and lying just beneath the right tentorial leaflet and concerning for a metastatic lesion.  This solitary lesion was confirmed with SRS protocol MRI brain on 11/16/16 with no new lesions noted. This lesion was treated with SRS on 11/23/16 which she tolerated very well. She also completed palliative radiotherapy to the right eighth rib from 11/02/16 - 11/23/16 with significant improvement in her pain. She remained on Herceptin, Perjeta, Faslodex and Zometa for systemic disease under the care and direction of [name].  She developed increased pain in the T8 region and also had PET positive findings in this region, as well as hypermetabolic right lower lobe mass concerning for progressive metastatic disease on follow up imaging 04/18/2017, despite her current systemic treatment. Her systemic therapy was switched to Kadcyla on 05/05/17 with discontinuation of Faslodex, Herceptin, and Perjeta.  She proceeded with palliative radiotherapy to 30 Gy in 10 fractions to the thoracic spine (T7-T9) from 05/18/2017 - 05/31/2017.  This treatment was tolerated very well.  Repeat brain MRI performed 06/15/17 showed no  new or progressive contrast enhancing lesions and no residual abnormality at the site of the previous treated right cerebellar lesion but Chest CT on 07/26/17 showed clear disease progression with interval increase in size of the multiple enhancing masses posteriorly overlying the spine, interval development  of left axillary and mediastinal adenopathy, increase in the size of lytic lesions in T8-T9 and left ninth rib, as well as increased groundglass opacities right lower lobe lung. Therefore, she was switched to Palbociclib + Faslodex + trastuzumab + Pertuzumab, started 08/10/17.   MRI T-spine on 09/21/17 showed the known T12 lesion had slightly progressed by a few millimeters from 03/15/17 and there was a new lesion at L2 which was not present on lumbar MRI from 07/13/16. Follow up systemic imaging on 12/13/17, showed a good response to treatment with improved LAN and decreased subcutaneous nodules in the upper thoracic region.  Follow up brain MRI imaging has continued to show interval increase in size the right parietal calvarium metastasis with stable appearance in size of a right frontal calvarial metastasis and no parenchymal or dural metastases. PET scan from 04/03/2018 unfortunately showed significant disease progression despite current systemic therapy.  Her systemic chemotherapy was discontinued on 04/04/2018 due to disease progression.  She was switched to [facility] (Fam-trastuzumab-Durextecan) q 3 weeks beginning 06/01/18.  INTERVAL HISTORY:   She has continued her systemic therapy with ENHERTU under the care and direction of [name] having completed 3 cycles as of 07/13/2018.  Recent repeat MRI imaging of the brain and thoracic spine was performed on 07/12/2018.  Brain imaging demonstrated 3 skull metastases ranging from 5 to 14 mm in diameter which appear stable to slightly increased since December 2019 but no new metastases or brain parenchymal or dural metastases identified.  On thoracic spine imaging there was enlargement of the left paracentral T12 vertebral body metastasis increased from 14 mm in May 2019 to 20 mm currently.  There is no associated pathologic fracture but otherwise stable appearance of previously treated lesions in the thoracic spine and no new disease noted.  The posterior subcutaneous  nodular metastases appear regressed.  Her recent imaging studies were reviewed and discussed at the multidisciplinary brain and spine conference on 07/16/2018 and the consensus recommendation was to proceed with stereotactic radiotherapy to the progressive disease at T12.  She presents today to review these results and recommendations.  On review of systems, the patient reports that she continues doing well overall.  She continues with modest fatigue which is tolerable and not significantly interfering with her daily activities.  She remains active.  She has also noticed significant improvement in the size of the subcutaneous nodules and in the burning/tingling pain that radiated from the left aspect of the spine underneath her breast since completing radiotherapy.  She has not had recent changes in visual or auditory acuity, tinnitus, increased imbalance, tremor or seizure activity. She denies recent headaches, sternal chest pain, shortness of breath, cough, fevers, chills, night sweats, or unintended weight changes. She denies any bowel or bladder disturbances, and denies abdominal pain, nausea or vomiting. She denies any new musculoskeletal or joint aches or pains, new skin lesions or concerns.  A complete review of systems is obtained and is otherwise negative.  Past Medical History:  Past Medical History:  Diagnosis Date  . Anemia    hx of  . Anxiety    r/t updated surgery  . Breast cancer ([place])    right  . Cataract    immature-not sure of which  eye  . Complication of anesthesia    pt states she is sensitive to meds  . Dizziness    has been going on for 67month;medical MD aware  . Genetic testing 07/11/2016   Test Results: Normal; No pathogenic mutations detected  Genes Analyzed: 43 genes on [employer] Common Cancers panel (APC, ATM, AXIN2, BARD1, BMPR1A, BRCA1, BRCA2, BRIP1, CDH1, CDKN2A, CHEK2, DICER1, EPCAM, GREM1, HOXB13, KIT, MEN1, MLH1, MSH2, MSH6, MUTYH, NBN, NF1, PALB2, PDGFRA, PMS2,  POLD1, POLE, PTEN, RAD50, RAD51C, RAD51D, SDHA, SDHB, SDHC, SDHD, SMAD4, SMARCA4, STK11, TP53, TSC1, TSC2, VHL).  .Marland KitchenGERD (gastroesophageal reflux disease)    Tums prn  . H. pylori infection   . H/O hiatal hernia   . Hemorrhoids   . History of UTI   . Hx of radiation therapy 10/25/12- 12/11/12   right chest wall/regional lymph nodes 5040 cGy, 28 sessions, right chest wall boost 1000 cGy 1 session  . Hx: UTI (urinary tract infection)   . Hyperlipidemia    but not on meds;diet and exercise controlled  . Hypertension    recently started Aldactone   . Insomnia    takes Melatoniin daily  . Metastasis to spinal column ([facility])    T5, Left  Femur cancer- radiation   . Neuropathy    "from back surgery"  . PONV (postoperative nausea and vomiting)    pt experienced hair loss, confusion and combative- 02/15/15  . Right knee pain   . Right shoulder pain   . Skin cancer    squamous, Nodule to back - "same cancer as breast."  . Urinary frequency    d/t taking Aldactone    Past Surgical History: Past Surgical History:  Procedure Laterality Date  . COLONOSCOPY    . cyst removed from left breast  1970  . DECOMPRESSIVE LUMBAR LAMINECTOMY LEVEL 4 N/A 02/15/2015   Procedure: DECOMPRESSION T5 AND T3-T7 STABALIZATION;  Surgeon: NConsuella Lose MD;  [facility];  Service: Neurosurgery;  Laterality: N/A;  . ESOPHAGOGASTRODUODENOSCOPY    . LAMINECTOMY N/A 03/27/2015   Procedure: Thoracic Four-Thoracic Six Laminectomy for tumor;  Surgeon: NConsuella Lose MD;  [facility];  Service: Neurosurgery;  Laterality: N/A;  T4-T6 Laminectomy  . left wrist surgery   2004   with plate  . MASTECTOMY MODIFIED RADICAL  05/10/2012   Procedure: MASTECTOMY MODIFIED RADICAL;  Surgeon: PMerrie Roof MD;  [place] [facility]  Service: General;  Laterality: Right;  RIGHT MODIFIED RADICAL MASTECTOMY  . MASTECTOMY, RADICAL Right   . MINOR BREAST BIOPSY Left 05/16/2016   Procedure: EXCISION OF BACK NODULE;   Surgeon: PAutumn MessingIII, MD;  [facility]  Service: General;  Laterality: Left;  . Nodule  back  05/17/2015  . PORTACATH PLACEMENT  05/10/2012   Procedure: INSERTION PORT-A-CATH;  Surgeon: PMerrie Roof MD;  [facility];  Service: General;  Laterality: Left;    Social History:  Social History   Socioeconomic History  . Marital status: Married    Spouse name: Not on file  . Number of children: 2  . Years of education: Not on file  . Highest education level: Not on file  Occupational History  . Not on file  Social Needs  . Financial resource strain: Not on file  . Food insecurity:    Worry: Not on file    Inability: Not on file  . Transportation needs:    Medical: Not on file    Non-medical: Not on  file  Tobacco Use  . Smoking status: Never Smoker  . Smokeless tobacco: Never Used  Substance and Sexual Activity  . Alcohol use: No  . Drug use: No  . Sexual activity: Yes    Birth control/protection: Post-menopausal  Lifestyle  . Physical activity:    Days per week: Not on file    Minutes per session: Not on file  . Stress: Not on file  Relationships  . Social connections:    Talks on phone: Not on file    Gets together: Not on file    Attends religious service: Not on file    Active member of club or organization: Not on file    Attends meetings of clubs or organizations: Not on file    Relationship status: Not on file  . Intimate partner violence:    Fear of current or ex partner: Not on file    Emotionally abused: Not on file    Physically abused: Not on file    Forced sexual activity: Not on file  Other Topics Concern  . Not on file  Social History Narrative  . Not on file    Family History: Family History  Problem Relation Age of Onset  . Leukemia Mother 76       deceased 14  . Stroke Father   . Diabetes Mellitus II Father   . Prostate cancer Brother 54       currently 37  . Stomach cancer Maternal Grandmother 79        deceased 65  . Prostate cancer Brother 42       currently 66     ALLERGIES:  is allergic to other; acyclovir and related; chlorhexidine; zanaflex [tizanidine hcl]; codeine; keflex [cephalexin]; naprosyn [naproxen]; oxycodone; tramadol; and tussin [guaifenesin].  Meds: Current Outpatient Medications  Medication Sig Dispense Refill  . ALPRAZolam (XANAX) 0.25 MG tablet Take 1 tablet by mouth as needed.    Marland Kitchen b complex vitamins tablet Take 1 tablet by mouth daily.    . Calcium Carbonate Antacid (TUMS PO) Take 2 tablets by mouth 2 (two) times daily as needed (acid reflux).    . Cholecalciferol (VITAMIN D) 2000 UNITS tablet Take 2,000 Units by mouth daily.    . ciprofloxacin (CIPRO) 500 MG tablet     . Coenzyme Q10 (COQ10) 100 MG CAPS Take 100 mg by mouth as needed. Days that she takes pravastatin MWF.    Marland Kitchen doxycycline (VIBRAMYCIN) 100 MG capsule     . fluticasone (FLONASE) 50 MCG/ACT nasal spray Place 1 spray into both nostrils daily as needed.    . gabapentin (NEURONTIN) 600 MG tablet Take 1 tablet (600 mg total) by mouth as directed. Take 1 tablet in the AM and afternoon and 1 1/2 tabs at bedtime (Patient taking differently: Take 600 mg by mouth 3 (three) times daily. Take 1 tablet in the AM and afternoon and 1 1/2 tabs at bedtime) 315 tablet 2  . HYDROcodone-acetaminophen (NORCO/VICODIN) 5-325 MG tablet Take 1 tablet by mouth every 6 (six) hours as needed for moderate pain. (Patient not taking: Reported on 04/09/2018) 30 tablet 0  . lidocaine-prilocaine (EMLA) cream Apply to affected area once (Patient taking differently: Apply 1 application topically every 30 (thirty) days. Apply to port prior to infusions) 30 g 3  . omeprazole (PRILOSEC) 40 MG capsule TAKE 1 CAPSULE BY MOUTH ONCE DAILY FOR 30 DAYS  5  . ondansetron (ZOFRAN) 8 MG tablet Take 1 tablet (8 mg total) by mouth every  8 (eight) hours as needed for nausea or vomiting. 20 tablet 0  . OVER THE COUNTER MEDICATION Place 1 drop into both  eyes 2 (two) times daily as needed (dry eyes). Over the counter lubricating eye drops    . polyethylene glycol (MIRALAX / GLYCOLAX) packet Take 17 g by mouth daily as needed.    . Probiotic Product (PROBIOTIC DAILY PO) Take 1 capsule by mouth daily.     Marland Kitchen spironolactone (ALDACTONE) 25 MG tablet Take 25 mg by mouth daily.    . tamsulosin (FLOMAX) 0.4 MG CAPS capsule     . tobramycin-dexamethasone (TOBRADEX) ophthalmic solution Place 1 drop into both eyes every 6 (six) hours. (Patient not taking: Reported on 04/09/2018) 5 mL 0   No current facility-administered medications for this encounter.    Facility-Administered Medications Ordered in Other Encounters  Medication Dose Route Frequency Provider Last Rate Last Dose  . sodium chloride 0.9 % injection 10 mL  10 mL Intravenous PRN Heather Lose, MD   10 mL at 04/26/17 0828    Physical Findings:  height is '5\' 4"'  (1.626 m) and weight is 134 lb 3.2 oz (60.9 kg). Her oral temperature is 97.7 F (36.5 C). Her blood pressure is 130/69 and her pulse is 88. Her respiration is 20 and oxygen saturation is 100%.   /10 In general this is a well appearing Caucasian female in no acute distress. She's alert and oriented x4 and appropriate throughout the examination. Cardiopulmonary assessment is negative for acute distress and she exhibits normal effort. She appears grossly neurologically intact.  PERRLA.  EOMs are intact bilaterally. Speech is clear and well organized, without evidence of difficulty with word finding. She is non-tender to palpate midline over T12 - L2.  She has FROM of bilateral upper and lower extremities.  Sensation is intact to light touch and strength is 5/5 and equal in the upper and lower extremities.     Lab Findings: Lab Results  Component Value Date   WBC 2.5 (L) 07/13/2018   HGB 11.9 (L) 07/13/2018   HCT 36.9 07/13/2018   MCV 91.1 07/13/2018   PLT 175 07/13/2018     Radiographic Findings: Mr Jeri Cos WU Contrast  Result  Date: 07/12/2018 CLINICAL DATA:  73 year old female with widely metastatic breast cancer. Restaging. Prior thoracic spine surgery and radiation, most recent spine radiation to T7-T9 in January 2019. Right cerebellar metastasis radiation in July 2018. Most recent radiation was to the posterior chest wall 4 subcutaneous metastases in January this year. EXAM: MRI HEAD WITHOUT AND WITH CONTRAST TECHNIQUE: Multiplanar, multiecho pulse sequences of the brain and surrounding structures were obtained without and with intravenous contrast. CONTRAST:  46m MULTIHANCE GADOBENATE DIMEGLUMINE 529 MG/ML IV SOLN COMPARISON:  Brain MRI 04/12/2018 and earlier. FINDINGS: Brain: No abnormal enhancement of the brain. And no dural thickening identified, despite several chronic calvarial metastases. No midline shift, mass effect, or evidence of intracranial mass lesion. No restricted diffusion to suggest acute infarction. No ventriculomegaly, extra-axial collection or acute intracranial hemorrhage. Cervicomedullary junction and pituitary are within normal limits. Mild scattered cerebral white matter T2 and FLAIR hyperintensity is stable. No cortical encephalomalacia or chronic cerebral blood products identified. Vascular: Major intracranial vascular flow voids are stable. The major dural venous sinuses are enhancing and appear patent. Skull and upper cervical spine: 3 small calvarial metastases are re-identified and appears stable to slightly larger since December. The right temporal or sphenoid squamosal lesion anterior to the right temporal tip is now 12-13 millimeters (  previously 10 millimeters). The posterior right parietal lesion is now 14 millimeters (series 16, image 7), stable. The small right anterior frontal convexity lesion is 5 millimeters, stable. No new skull metastasis identified. Central skull base and visible upper cervical spine remain normal. Sinuses/Orbits: Stable and negative. Other: Mastoids remain clear. Visible  internal auditory structures appear normal. Scalp and face soft tissues appear negative. IMPRESSION: 1. No brain parenchymal or dural metastasis identified. 2. Three skull metastases ranging from 5 to 14 mm diameter are stable to slightly increased since December. No new metastasis identified. Electronically Signed   By: Genevie Ann M.D.   On: 07/12/2018 16:47   Mr Thoracic Spine W Wo Contrast  Result Date: 07/12/2018 CLINICAL DATA:  73 year old female with widely metastatic breast cancer. Restaging. Prior thoracic spine surgery and radiation, most recent spine radiation to T7-T9 in January 2019. Right cerebellar metastasis radiation in July 2018. Most recent radiation was to the posterior chest wall 4 subcutaneous metastases in January this year. EXAM: MRI THORACIC WITHOUT AND WITH CONTRAST TECHNIQUE: Multiplanar and multiecho pulse sequences of the thoracic spine were obtained without and with intravenous contrast. CONTRAST:  33m MULTIHANCE GADOBENATE DIMEGLUMINE 529 MG/ML IV SOLN COMPARISON:  Thoracic spine MRI 09/21/2017. PET-CT 04/03/2018 and earlier. FINDINGS: Limited cervical spine imaging:  Within normal limits. Thoracic spine segmentation: Appears normal, same numbering system as on 09/21/2017. Alignment: Stable chronic T5 level anterolisthesis and kyphosis from pathologic vertebra plana. Stable vertebral height and alignment elsewhere. Vertebrae: The T12 vertebral body metastasis seen on 09/21/2017 has increased from 14-20 millimeters (series 22, image 9). Maintained T12 body height. T2 through T11 posterior paraspinal hardware again obscures some of the marrow signal at those levels. No other thoracic vertebral lesion is identified. The visible L1 level remains negative. There is partially visible decreased marrow signal in L2 on series 22, image 6 which was hypermetabolic on the November PET. Cord: Obscured from the T2 to the T10 level. Appears normal from T11 into the upper lobe lumbar spine. The conus  occurs below L1. Paraspinal and other soft tissues: Chronic radiation changes in the right lung. Stable visible chest and upper abdominal viscera. The treated posterior chest wall subcutaneous lesions appear regressed, with mild residual nodularity such as on series 23, image 6. Disc levels: Detail limited from T2-T3 to T11-T12 due to chronic spinal hardware. No new finding is evident. IMPRESSION: 1. Enlarged left paracentral T12 vertebral body metastasis from 14 mm in May 2019 to 20 mm now. No associated pathologic fracture. 2. Satisfactory post treatment appearance of the thoracic spine elsewhere, with limited detail from T2 to T11 due to spinal hardware. 3. Posterior subcutaneous nodular metastases appear regressed. Electronically Signed   By: HGenevie AnnM.D.   On: 07/12/2018 16:35    Impression/Plan: 187 73yo woman with ER positive breast cancer with a solitary brain metastasis, bony metastatses and concerning pulmonary nodule.  Regarding her metastatic brain disease, she appears to be clinically stable despite mild progression with one enlarging calvarial met at the base of the skull.  There was no residual or progressive parenchymal disease noted in the brain.  As she remains asymptomatic regarding the calvarial lesions and without evidence of new or progressive parenchymal lesions, as per recommendations from recent multidisciplinary brain and spine conference, we will continue to monitor her closely with serial brain MRI scans every 3 months and office follow-up thereafter to review results.  Regarding her systemic disease, she will continue in routine follow-up for disease management under  the care and direction of [name].   2. Spine metastases.  On recent follow up MRI there is evidence of disease progression at T12.   Her recent imaging studies were reviewed and discussed at the multidisciplinary brain and spine conference on 07/16/2018 and the consensus recommendation was to proceed with  stereotactic radiotherapy to the progressive disease at T12.  We discussed the available radiation techniques, and focused on the details of logistics and delivery.  The recommendation is to proceed with a single fraction of stereotactic radiosurgery to the progressive lesion at T12.  We reviewed the anticipated acute and late sequelae associated with radiation in this setting. The patient was encouraged to ask questions that were answered to her satisfaction.  At the conclusion of our conversation, the patient elects to proceed with [facility] treatment as recommended.  She has freely signed written consent to proceed today in the office and a copy of this document has been placed in her chart.  She is scheduled for CT simulation on 07/31/2018 in anticipation of proceeding with treatment on 08/07/2018 in coordination with her neurosurgeon, Dr. Kathyrn Sheriff.  She appears to have a good understanding of her overall disease and our recommendations and is in agreement with the stated plan.  She knows to call at anytime in the interim with any questions or concerns.     Nicholos Johns, PA-C

## 2018-07-31 ENCOUNTER — Other Ambulatory Visit: Payer: Self-pay

## 2018-07-31 ENCOUNTER — Ambulatory Visit
Admission: RE | Admit: 2018-07-31 | Discharge: 2018-07-31 | Disposition: A | Discharge status: Home / Self Care | Payer: Medicare Other | Source: Ambulatory Visit | Attending: Radiation Oncology | Admitting: Radiation Oncology

## 2018-07-31 DIAGNOSIS — D492 Neoplasm of unspecified behavior of bone, soft tissue, and skin: Secondary | ICD-10-CM | POA: Insufficient documentation

## 2018-07-31 DIAGNOSIS — Z510 Encounter for antineoplastic radiation therapy: Secondary | ICD-10-CM | POA: Diagnosis not present

## 2018-07-31 DIAGNOSIS — C50411 Malignant neoplasm of upper-outer quadrant of right female breast: Secondary | ICD-10-CM | POA: Diagnosis not present

## 2018-07-31 DIAGNOSIS — C7951 Secondary malignant neoplasm of bone: Secondary | ICD-10-CM | POA: Diagnosis not present

## 2018-07-31 DIAGNOSIS — C792 Secondary malignant neoplasm of skin: Secondary | ICD-10-CM | POA: Diagnosis not present

## 2018-08-01 NOTE — Assessment & Plan Note (Signed)
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 MRI 02/14/2015: T5 large destructive lesion with pathologic compression fracture and extensive epidural tumor, involvement of T4, excision metastatic carcinoma ER 60%, PR 0%, HER-2 positive ratio 2.71 average copy #7.45 03/27/2015: T4-6 decompression surgery for spinal cord compression PET-CT 12/10/17Rt para-spinal mass mildy hypermetabolic SUV 5.9, lytic cortical lesion 4.8 cm lesion left prox femur suv 8.8 favor osseus met disease Echo 04/12/2017 shows well preserved ejection fraction of 55-60%  Treatment summary: 1. Resumed anastrozole 05/21/2015, but it has caused significant hair loss so we switched her to exemestane 25 mg once daily 07/23/2015 2. Herceptin every 3 week started 04/10/2015 3. Bone metastases: Zometa every 3 months-next duelate May/early June 4. Radiation therapy to the spine (Dr. [name]) 04/13/2015 to 05/19/2015 5. Radiation therapy to left femur 04/28/2016 to 05/12/2016  6. SRS to the brain lesion 11/24/2016 7.Kadcyla started 05/05/2017 discontinued 07/27/2017 8.Palbociclib +Faslodex +trastuzumab+ Pertuzumabstarted 08/10/2017, stopped 04/04/2018 9. Enhertu started 06/01/18 -------------------------------------------------------------------------------------------------------------------------- PET CT scan 04/03/2018: Multiple new bone lesions including skull, right L2, T12, left distal femur, right distal femur, right acromium. Posterior upper chest wall lesions increase in size and nodularity.  Current treatment:Cycle 4day 1Enhertu (Fam-trastuzumab-Durextecan) Echo 04/09/2018 shows EF of 55-60%.  Toxicities: 1. Dry eyes/irritation/infection: she will continue to follow with ophthalmology. 2.   Severe fatigue 3. Pain: controlled right now with Gabapentin. 4.  Hair loss  MRI brain and thoracic spine on 07/12/2018: No parenchymal metastases.  3 skull metastases 5 to 14 mm stable to slightly increased since December MRI thoracic spine: Enlarged left paracentral T12 vertebral body metastases 20 mm, posterior subcutaneous meds appear regressed  Our plan is to do scans after 4 cycles.  We discussed different treatment options including neratinib with capecitabine as an alternative treatment option if she cannot tolerate this treatment or if she progresses on this treatment.  Return to clinic in3weeks for cycle 5 after scans are performed.

## 2018-08-02 DIAGNOSIS — C792 Secondary malignant neoplasm of skin: Secondary | ICD-10-CM | POA: Diagnosis not present

## 2018-08-02 DIAGNOSIS — Z510 Encounter for antineoplastic radiation therapy: Secondary | ICD-10-CM | POA: Diagnosis not present

## 2018-08-02 DIAGNOSIS — D492 Neoplasm of unspecified behavior of bone, soft tissue, and skin: Secondary | ICD-10-CM | POA: Diagnosis not present

## 2018-08-02 DIAGNOSIS — C50411 Malignant neoplasm of upper-outer quadrant of right female breast: Secondary | ICD-10-CM | POA: Diagnosis not present

## 2018-08-02 DIAGNOSIS — C7951 Secondary malignant neoplasm of bone: Secondary | ICD-10-CM | POA: Diagnosis not present

## 2018-08-02 NOTE — Progress Notes (Signed)
Patient Care Team: [name], MD as PCP - General (Family Medicine)  DIAGNOSIS:    ICD-10-CM   1. Breast cancer metastasized to bone, right ([place]) C50.911    C79.51     SUMMARY OF ONCOLOGIC HISTORY:   Primary cancer of upper outer quadrant of right female breast ([place])   03/08/2012 Initial Diagnosis    Right breast invasive ductal carcinoma ER positive PR negative HER-2 positive Ki-67 44%; another breast mass biopsied in the anterior part of the breast which was also positive for malignancy that was HER-2 negative    03/14/2012 Cancer Staging    Staging form: Breast, AJCC 7th Edition - Pathologic: Stage IV (M1) - Signed by [name], NP on 06/22/2018    05/10/2012 Surgery    Right mastectomy and axillary lymph node dissection: Multifocal disease 5 cm, 1.7 cm, 1.6 cm, ER positive PR negative HER-2 positive Ki-67 44%, 3/7 lymph nodes positive    06/14/2012 - 06/12/2013 Chemotherapy    Adjuvant chemotherapy with [address] 6 followed by Herceptin maintenance    10/25/2012 - 12/11/2012 Radiation Therapy    Adjuvant radiation therapy    02/07/2013 -  Anti-estrogen oral therapy    Letrozole 2.5 mg daily    02/14/2015 Imaging    MRI spine: Large destructive T5 lesion with severe pathologic compression fracture and extensive epidural tumor, severe spinal stenosis with moderate cord compression, tumor involvement of T4    03/18/2015 PET scan    Residual enhancing soft tissue adjacent to the spinal cord. No evidence of metastatic disease. Nonspecific uptake the left nipple    03/27/2015 - 03/30/2015 Hospital Admission    T4-6 decompression for spinal cord compression (lower extremity paralysis)    04/10/2015 -  Chemotherapy    Palliative treatment with Herceptin every 3 weeks with letrozole 2.5 mg daily    04/13/2015 - 05/19/2015 Radiation Therapy    Palliative radiation treatment to the spine    09/01/2015 Imaging    CT chest abdomen pelvis: Pathologic fracture with posterior  fusion at T5 no other evidence of metastatic disease in the chest abdomen pelvis    12/23/2015 Imaging    CT CAP: new nonspecific 0.6 cm lymph node was stated mediastinum needs follow-up CT, innumerable tiny groundglass pulmonary nodules throughout both lungs unchanged, patchy consolidation from radiation, T5 fracture, no mets    04/15/2016 PET scan    Rt para-spinal mass mildy hypermetabolic SUV 5.9, lytic cortical lesion 4.8 cm lesion left prox femur suv 8.8 favor osseus met disease    04/28/2016 - 05/12/2016 Radiation Therapy    Palliative XRT Left femur    05/24/2016 Procedure    Soft tissue mass biopsy back: Metastatic breast cancer ER 80%, PR 0%, Ki-67 50%, HER-2 positive ratio 2.25    06/09/2016 - 03/29/2017 Anti-estrogen oral therapy    Faslodex with Herceptin and Perjeta every 4 weeks    06/13/2016 - 06/15/2016 Hospital Admission    uncomplicated revision of previous thoracic instrumentation.    10/10/2016 PET scan    Interval development of new hypermetabolic 2.5 x 1.1 cm lesion posterior right eighth rib consistent with metastatic disease, stable right paraspinal lesion at T8, clustered soft tissue nodules in the posterior midline back near cervicothoracic junction smaller than before     11/03/2016 - 11/07/2016 Radiation Therapy    Palliative radiation to right eighth rib    11/04/2016 Imaging    Solitary contrast-enhancing lesion in the right cerebellum 6 x 2 mm concerning for metastatic lesion  11/24/2016 - 11/24/2016 Radiation Therapy    SRS to the brain lesion    04/18/2017 PET scan    New ill-defined rounded airspace opacity in posterior right lower lobe. Differential diagnosis includes malignancy and infectious/inflammatory process.Increased hypermetabolic lytic lesion in T8 vertebral body, consistent with bone metastasis.  Increased hypermetabolic posterior upper chest wall subcutaneous nodules, highly suspicious metastatic disease. Further improvement in right posterior eighth  rib metastasis.    05/04/2017 - 07/27/2017 Chemotherapy    Kadcyla every 3 weeks palliative chemotherapy stopped for progression    07/26/2017 Imaging    Interval increase in size of the multiple enhancing masses posteriorly overlying the spine, interval development of left axillary and mediastinal adenopathy, increase in the size of lytic lesions in T8-T9 and left ninth rib, increased groundglass opacities right lower lobe lung    07/27/2017 - 05/11/2018 Chemotherapy    Palbociclib + Faslodex + trastuzumab + Pertuzumab      04/03/2018 PET scan    PET CT scan showed progression of disease not only in the bones but also in the subcutaneous nodules.    05/15/2018 - 05/25/2018 Radiation Therapy    Palliative radiation to the back    06/01/2018 -  Chemotherapy    Enhertu (Transtuzumab Deruxtecan)      Breast cancer metastasized to bone, right ([place])   02/14/2015 Initial Diagnosis    Breast cancer metastasized to bone, right ([place])    06/01/2018 -  Chemotherapy    The patient had palonosetron (ALOXI) injection 0.25 mg, 0.25 mg, Intravenous,  Once, 3 of 5 cycles Administration: 0.25 mg (06/01/2018), 0.25 mg (06/22/2018), 0.25 mg (07/13/2018) fam-trastuzumab deruxtecan-nxki (ENHERTU) 332 mg in dextrose 5 % 100 mL chemo infusion, 5.4 mg/kg = 332 mg, Intravenous,  Once, 3 of 5 cycles Administration: 332 mg (06/01/2018), 332 mg (06/22/2018), 332 mg (07/13/2018)  for chemotherapy treatment.      CHIEF COMPLIANT: Cycle 4 Enhertu  INTERVAL HISTORY: Heather [name] is a 73 y.o. with above-mentioned history of metastatic breast cancer who is hereforcycle4of chemotherapy with trastuzumab-Deruxtecan (Enhertu).She presents to the clinic alone today. Radiation to T 12 to be done next week for the lesion that's increasing in size. Skin lesions are looking much better.  REVIEW OF SYSTEMS:   Constitutional: Denies fevers, chills or abnormal weight loss Eyes: Denies blurriness of vision Ears, nose, mouth, throat,  and face: Denies mucositis or sore throat Respiratory: Denies cough, dyspnea or wheezes Cardiovascular: Denies palpitation, chest discomfort Gastrointestinal: Denies nausea, heartburn or change in bowel habits Skin: Denies abnormal skin rashes Lymphatics: Denies new lymphadenopathy or easy bruising Neurological: Denies numbness, tingling or new weaknesses Behavioral/Psych: Mood is stable, no new changes  Extremities: No lower extremity edema Breast: denies any pain or lumps or nodules in either breasts All other systems were reviewed with the patient and are negative.  I have reviewed the past medical history, past surgical history, social history and family history with the patient and they are unchanged from previous note.  ALLERGIES:  is allergic to other; acyclovir and related; chlorhexidine; zanaflex [tizanidine hcl]; codeine; keflex [cephalexin]; naprosyn [naproxen]; oxycodone; tramadol; and tussin [guaifenesin].  MEDICATIONS:  Current Outpatient Medications  Medication Sig Dispense Refill  . ALPRAZolam (XANAX) 0.25 MG tablet Take 1 tablet by mouth as needed.    Marland Kitchen b complex vitamins tablet Take 1 tablet by mouth daily.    . Calcium Carbonate Antacid (TUMS PO) Take 2 tablets by mouth 2 (two) times daily as needed (acid reflux).    Marland Kitchen  Cholecalciferol (VITAMIN D) 2000 UNITS tablet Take 2,000 Units by mouth daily.    . ciprofloxacin (CIPRO) 500 MG tablet     . Coenzyme Q10 (COQ10) 100 MG CAPS Take 100 mg by mouth as needed. Days that she takes pravastatin MWF.    Marland Kitchen doxycycline (VIBRAMYCIN) 100 MG capsule     . fluticasone (FLONASE) 50 MCG/ACT nasal spray Place 1 spray into both nostrils daily as needed.    . gabapentin (NEURONTIN) 600 MG tablet Take 1 tablet (600 mg total) by mouth as directed. Take 1 tablet in the AM and afternoon and 1 1/2 tabs at bedtime (Patient taking differently: Take 600 mg by mouth 3 (three) times daily. Take 1 tablet in the AM and afternoon and 1 1/2 tabs at  bedtime) 315 tablet 2  . HYDROcodone-acetaminophen (NORCO/VICODIN) 5-325 MG tablet Take 1 tablet by mouth every 6 (six) hours as needed for moderate pain. (Patient not taking: Reported on 04/09/2018) 30 tablet 0  . lidocaine-prilocaine (EMLA) cream Apply to affected area once (Patient taking differently: Apply 1 application topically every 30 (thirty) days. Apply to port prior to infusions) 30 g 3  . omeprazole (PRILOSEC) 40 MG capsule TAKE 1 CAPSULE BY MOUTH ONCE DAILY FOR 30 DAYS  5  . ondansetron (ZOFRAN) 8 MG tablet Take 1 tablet (8 mg total) by mouth every 8 (eight) hours as needed for nausea or vomiting. 20 tablet 0  . OVER THE COUNTER MEDICATION Place 1 drop into both eyes 2 (two) times daily as needed (dry eyes). Over the counter lubricating eye drops    . polyethylene glycol (MIRALAX / GLYCOLAX) packet Take 17 g by mouth daily as needed.    . Probiotic Product (PROBIOTIC DAILY PO) Take 1 capsule by mouth daily.     Marland Kitchen spironolactone (ALDACTONE) 25 MG tablet Take 25 mg by mouth daily.    . tamsulosin (FLOMAX) 0.4 MG CAPS capsule     . tobramycin-dexamethasone (TOBRADEX) ophthalmic solution Place 1 drop into both eyes every 6 (six) hours. (Patient not taking: Reported on 04/09/2018) 5 mL 0   No current facility-administered medications for this visit.    Facility-Administered Medications Ordered in Other Visits  Medication Dose Route Frequency Provider Last Rate Last Dose  . sodium chloride 0.9 % injection 10 mL  10 mL Intravenous PRN [name], MD   10 mL at 04/26/17 5621    PHYSICAL EXAMINATION: ECOG PERFORMANCE STATUS: 1 - Symptomatic but completely ambulatory  Vitals:   08/03/18 1012  BP: (!) 147/66  Pulse: 77  Resp: 18  Temp: 98.4 F (36.9 C)  SpO2: 100%   Filed Weights   08/03/18 1012  Weight: 135 lb 9.6 oz (61.5 kg)    GENERAL: alert, no distress and comfortable SKIN: skin color, texture, turgor are normal, skin lesions better on neck and back EYES: normal,  Conjunctiva are pink and non-injected, sclera clear OROPHARYNX: no exudate, no erythema and lips, buccal mucosa, and tongue normal  NECK: supple, thyroid normal size, non-tender, without nodularity LYMPH: no palpable lymphadenopathy in the cervical, axillary or inguinal LUNGS: clear to auscultation and percussion with normal breathing effort HEART: regular rate & rhythm and no murmurs and no lower extremity edema ABDOMEN: abdomen soft, non-tender and normal bowel sounds MUSCULOSKELETAL: no cyanosis of digits and no clubbing  NEURO: alert & oriented x 3 with fluent speech, no focal motor/sensory deficits EXTREMITIES: No lower extremity edema  LABORATORY DATA:  I have reviewed the data as listed CMP Latest Ref  Rng & Units 08/03/2018 07/13/2018 06/22/2018  Glucose 70 - 99 mg/dL 81 87 105(H)  BUN 8 - 23 mg/dL '9 10 13  '$ Creatinine 0.44 - 1.00 mg/dL 0.79 0.84 0.86  Sodium 135 - 145 mmol/L 140 137 140  Potassium 3.5 - 5.1 mmol/L 4.3 3.9 3.8  Chloride 98 - 111 mmol/L 105 102 105  CO2 22 - 32 mmol/L '27 28 25  '$ Calcium 8.9 - 10.3 mg/dL 9.5 9.7 9.4  Total Protein 6.5 - 8.1 g/dL 7.2 7.0 7.1  Total Bilirubin 0.3 - 1.2 mg/dL 0.3 0.3 0.4  Alkaline Phos 38 - 126 U/L 82 80 105  AST 15 - 41 U/L 35 36 29  ALT 0 - 44 U/L '25 25 22    '$ Lab Results  Component Value Date   WBC 2.6 (L) 08/03/2018   HGB 12.0 08/03/2018   HCT 37.1 08/03/2018   MCV 92.3 08/03/2018   PLT 168 08/03/2018   NEUTROABS 1.7 08/03/2018    ASSESSMENT & PLAN:  Breast cancer metastasized to bone, right ([facility]) Right breast invasive ductal carcinoma ER positive PR negative HER-2 positive Ki-67 44% multifocal disease 3/7 lymph nodes positive T2 N1 M0 stage IIB status post adjuvant chemotherapy with TCH followed by Herceptin maintenance, adjuvant radiation therapy and Letrozole. MRI 02/14/2015: T5 large destructive lesion with pathologic compression fracture and extensive epidural tumor, involvement of T4, excision metastatic carcinoma ER  60%, PR 0%, HER-2 positive ratio 2.71 average copy #7.45 03/27/2015: T4-6 decompression surgery for spinal cord compression PET-CT 12/10/17Rt para-spinal mass mildy hypermetabolic SUV 5.9, lytic cortical lesion 4.8 cm lesion left prox femur suv 8.8 favor osseus met disease Echo 04/12/2017 shows well preserved ejection fraction of 55-60%  Treatment summary: 1. Resumed anastrozole 05/21/2015, but it has caused significant hair loss so we switched her to exemestane 25 mg once daily 07/23/2015 2. Herceptin every 3 week started 04/10/2015 3. Bone metastases: Zometa every 3 months-next duelate May/early June 4. Radiation therapy to the spine (Dr. [name]) 04/13/2015 to 05/19/2015 5. Radiation therapy to left femur 04/28/2016 to 05/12/2016  6. SRS to the brain lesion 11/24/2016 7.Kadcyla started 05/05/2017 discontinued 07/27/2017 8.Palbociclib +Faslodex +trastuzumab+ Pertuzumabstarted 08/10/2017, stopped 04/04/2018 9. Enhertu started 06/01/18 -------------------------------------------------------------------------------------------------------------------------- PET CT scan 04/03/2018: Multiple new bone lesions including skull, right L2, T12, left distal femur, right distal femur, right acromium. Posterior upper chest wall lesions increase in size and nodularity.  Current treatment:Cycle 4day 1Enhertu (Fam-trastuzumab-Durextecan) Echo 04/09/2018 shows EF of 55-60%.  Toxicities: 1. Dry eyes/irritation/infection: she will continue to follow with ophthalmology. 2.  Severe fatigue 3. Pain: controlled right now with Gabapentin. 4.  Hair loss  MRI brain and thoracic spine on 07/12/2018: No parenchymal metastases.  3 skull metastases 5 to 14 mm stable to slightly increased since December MRI thoracic spine: Enlarged left paracentral T12 vertebral body metastases 20 mm, posterior subcutaneous meds appear regressed. XRT to T12 being planned.  Our plan is to do scans after 5/6 cycles.  We  discussed different treatment options including neratinib with capecitabine as an alternative treatment option if she cannot tolerate this treatment or if she progresses on this treatment.  Return to clinic in3weeks for cycle 5.    No orders of the defined types were placed in this encounter.  The patient has a good understanding of the overall plan. she agrees with it. she will call with any problems that may develop before the next visit here.  I communicated with the patient via a Webex video conference and verified that I  am speaking with the correct person using two identifiers. I discussed the limitations, risks, security and privacy concerns of performing an evaluation and management service by Webex. I also discussed with the patient that there will be a patient responsible charge related to this service. The patient expressed understanding and agreed to proceed.    [name], MD 08/03/2018  Julious Oka Dorshimer am acting as scribe for Dr. [name].  I have reviewed the above documentation for accuracy and completeness, and I agree with the above.

## 2018-08-03 ENCOUNTER — Inpatient Hospital Stay: Payer: Medicare Other

## 2018-08-03 ENCOUNTER — Other Ambulatory Visit: Payer: Self-pay

## 2018-08-03 ENCOUNTER — Telehealth: Payer: Self-pay | Admitting: Hematology and Oncology

## 2018-08-03 ENCOUNTER — Inpatient Hospital Stay (HOSPITAL_BASED_OUTPATIENT_CLINIC_OR_DEPARTMENT_OTHER): Payer: Medicare Other | Admitting: Hematology and Oncology

## 2018-08-03 DIAGNOSIS — C50911 Malignant neoplasm of unspecified site of right female breast: Secondary | ICD-10-CM

## 2018-08-03 DIAGNOSIS — R5383 Other fatigue: Secondary | ICD-10-CM | POA: Diagnosis not present

## 2018-08-03 DIAGNOSIS — Z79899 Other long term (current) drug therapy: Secondary | ICD-10-CM | POA: Diagnosis not present

## 2018-08-03 DIAGNOSIS — C7951 Secondary malignant neoplasm of bone: Secondary | ICD-10-CM

## 2018-08-03 DIAGNOSIS — G9529 Other cord compression: Secondary | ICD-10-CM

## 2018-08-03 DIAGNOSIS — Z5112 Encounter for antineoplastic immunotherapy: Secondary | ICD-10-CM | POA: Diagnosis not present

## 2018-08-03 DIAGNOSIS — R6889 Other general symptoms and signs: Secondary | ICD-10-CM

## 2018-08-03 DIAGNOSIS — Z79811 Long term (current) use of aromatase inhibitors: Secondary | ICD-10-CM | POA: Diagnosis not present

## 2018-08-03 DIAGNOSIS — Z9011 Acquired absence of right breast and nipple: Secondary | ICD-10-CM | POA: Diagnosis not present

## 2018-08-03 DIAGNOSIS — Z9221 Personal history of antineoplastic chemotherapy: Secondary | ICD-10-CM

## 2018-08-03 DIAGNOSIS — C7931 Secondary malignant neoplasm of brain: Secondary | ICD-10-CM | POA: Diagnosis not present

## 2018-08-03 DIAGNOSIS — Z95828 Presence of other vascular implants and grafts: Secondary | ICD-10-CM

## 2018-08-03 DIAGNOSIS — C50411 Malignant neoplasm of upper-outer quadrant of right female breast: Secondary | ICD-10-CM

## 2018-08-03 DIAGNOSIS — Z923 Personal history of irradiation: Secondary | ICD-10-CM | POA: Diagnosis not present

## 2018-08-03 DIAGNOSIS — Z170 Estrogen receptor positive status [ER+]: Secondary | ICD-10-CM

## 2018-08-03 LAB — CMP (CANCER CENTER ONLY)
ALT: 25 U/L (ref 0–44)
AST: 35 U/L (ref 15–41)
Albumin: 3.6 g/dL (ref 3.5–5.0)
Alkaline Phosphatase: 82 U/L (ref 38–126)
Anion gap: 8 (ref 5–15)
BUN: 9 mg/dL (ref 8–23)
CO2: 27 mmol/L (ref 22–32)
Calcium: 9.5 mg/dL (ref 8.9–10.3)
Chloride: 105 mmol/L (ref 98–111)
Creatinine: 0.79 mg/dL (ref 0.44–1.00)
GFR, Est AFR Am: >60 mL/min (ref >60)
GFR, Estimated: >60 mL/min (ref >60)
Glucose, Bld: 81 mg/dL (ref 70–99)
Potassium: 4.3 mmol/L (ref 3.5–5.1)
Sodium: 140 mmol/L (ref 135–145)
Total Bilirubin: 0.3 mg/dL (ref 0.3–1.2)
Total Protein: 7.2 g/dL (ref 6.5–8.1)

## 2018-08-03 LAB — CBC WITH DIFFERENTIAL (CANCER CENTER ONLY)
Abs Immature Granulocytes: 0.01 10*3/uL (ref 0.00–0.07)
Basophils Absolute: 0 10*3/uL (ref 0.0–0.1)
Basophils Relative: 1 %
Eosinophils Absolute: 0.2 10*3/uL (ref 0.0–0.5)
Eosinophils Relative: 6 %
HCT: 37.1 % (ref 36.0–46.0)
Hemoglobin: 12 g/dL (ref 12.0–15.0)
Immature Granulocytes: 0 %
Lymphocytes Relative: 14 %
Lymphs Abs: 0.4 10*3/uL — ABNORMAL LOW (ref 0.7–4.0)
MCH: 29.9 pg (ref 26.0–34.0)
MCHC: 32.3 g/dL (ref 30.0–36.0)
MCV: 92.3 fL (ref 80.0–100.0)
Monocytes Absolute: 0.4 10*3/uL (ref 0.1–1.0)
Monocytes Relative: 15 %
Neutro Abs: 1.7 10*3/uL (ref 1.7–7.7)
Neutrophils Relative %: 64 %
Platelet Count: 168 10*3/uL (ref 150–400)
RBC: 4.02 MIL/uL (ref 3.87–5.11)
RDW: 17.2 % — ABNORMAL HIGH (ref 11.5–15.5)
WBC Count: 2.6 10*3/uL — ABNORMAL LOW (ref 4.0–10.5)
nRBC: 0 % (ref 0.0–0.2)

## 2018-08-03 MED ORDER — PALONOSETRON HCL INJECTION 0.25 MG/5ML
0.2500 mg | Freq: Once | INTRAVENOUS | Status: AC
  Administered 2018-08-03: 0.25 mg via INTRAVENOUS

## 2018-08-03 MED ORDER — DEXAMETHASONE SODIUM PHOSPHATE 10 MG/ML IJ SOLN
10.0000 mg | Freq: Once | INTRAMUSCULAR | Status: AC
  Administered 2018-08-03: 10 mg via INTRAVENOUS

## 2018-08-03 MED ORDER — SODIUM CHLORIDE 0.9% FLUSH
10.0000 mL | INTRAVENOUS | Status: DC | PRN
  Administered 2018-08-03: 10 mL
  Filled 2018-08-03: qty 10

## 2018-08-03 MED ORDER — DEXAMETHASONE SODIUM PHOSPHATE 10 MG/ML IJ SOLN
INTRAMUSCULAR | Status: AC
  Filled 2018-08-03: qty 1

## 2018-08-03 MED ORDER — FAM-TRASTUZUMAB DERUXTECAN-NXKI CHEMO 100 MG IV SOLR
5.4000 mg/kg | Freq: Once | INTRAVENOUS | Status: AC
  Administered 2018-08-03: 332 mg via INTRAVENOUS
  Filled 2018-08-03: qty 16.6

## 2018-08-03 MED ORDER — SODIUM CHLORIDE 0.9% FLUSH
10.0000 mL | Freq: Once | INTRAVENOUS | Status: AC
  Administered 2018-08-03: 10 mL
  Filled 2018-08-03: qty 10

## 2018-08-03 MED ORDER — HEPARIN SOD (PORK) LOCK FLUSH 100 UNIT/ML IV SOLN
500.0000 [IU] | Freq: Once | INTRAVENOUS | Status: AC | PRN
  Administered 2018-08-03: 500 [IU]
  Filled 2018-08-03: qty 5

## 2018-08-03 MED ORDER — ACETAMINOPHEN 325 MG PO TABS
ORAL_TABLET | ORAL | Status: AC
  Filled 2018-08-03: qty 2

## 2018-08-03 MED ORDER — CYANOCOBALAMIN 1000 MCG/ML IJ SOLN
INTRAMUSCULAR | Status: AC
  Filled 2018-08-03: qty 1

## 2018-08-03 MED ORDER — DEXTROSE 5 % IV SOLN
Freq: Once | INTRAVENOUS | Status: AC
  Administered 2018-08-03: 12:00:00 via INTRAVENOUS
  Filled 2018-08-03: qty 250

## 2018-08-03 MED ORDER — CYANOCOBALAMIN 1000 MCG/ML IJ SOLN
1000.0000 ug | Freq: Once | INTRAMUSCULAR | Status: AC
  Administered 2018-08-03: 1000 ug via INTRAMUSCULAR

## 2018-08-03 MED ORDER — ACETAMINOPHEN 325 MG PO TABS
650.0000 mg | ORAL_TABLET | Freq: Once | ORAL | Status: AC
  Administered 2018-08-03: 650 mg via ORAL

## 2018-08-03 MED ORDER — PALONOSETRON HCL INJECTION 0.25 MG/5ML
INTRAVENOUS | Status: AC
  Filled 2018-08-03: qty 5

## 2018-08-03 NOTE — Patient Instructions (Signed)
[facility] Discharge Instructions for Patients Receiving Chemotherapy  Today you received the following chemotherapy agents Fam-trastuzumab (ENHERTU).  To help prevent nausea and vomiting after your treatment, we encourage you to take your nausea medication as prescribed.   If you develop nausea and vomiting that is not controlled by your nausea medication, call the clinic.   BELOW ARE SYMPTOMS THAT SHOULD BE REPORTED IMMEDIATELY:  *FEVER GREATER THAN 100.5 F  *CHILLS WITH OR WITHOUT FEVER  NAUSEA AND VOMITING THAT IS NOT CONTROLLED WITH YOUR NAUSEA MEDICATION  *UNUSUAL SHORTNESS OF BREATH  *UNUSUAL BRUISING OR BLEEDING  TENDERNESS IN MOUTH AND THROAT WITH OR WITHOUT PRESENCE OF ULCERS  *URINARY PROBLEMS  *BOWEL PROBLEMS  UNUSUAL RASH Items with * indicate a potential emergency and should be followed up as soon as possible.  Feel free to call the clinic should you have any questions or concerns. The clinic phone number is [phone].  Please show the CHEMO ALERT CARD at check-in to the Emergency Department and triage nurse.   

## 2018-08-03 NOTE — Telephone Encounter (Signed)
Gave avs and calendar ° °

## 2018-08-05 NOTE — Progress Notes (Signed)
  Radiation Oncology         [phone] ________________________________  Name: Heather [name] MRN: [id number]  Date: 07/31/2018  DOB: 01/11/1946  STEREOTACTIC BODY RADIOTHERAPY SIMULATION AND TREATMENT PLANNING NOTE    ICD-10-CM   1. Metastatic cancer to spine [facility]) C79.51     DIAGNOSIS:  73 yo woman with spinal mets to T12 and L2  NARRATIVE:  The patient was brought to the [place].  Identity was confirmed.  All relevant records and images related to the planned course of therapy were reviewed.  The patient freely provided informed written consent to proceed with treatment after reviewing the details related to the planned course of therapy. The consent form was witnessed and verified by the simulation staff.  Then, the patient was set-up in a stable reproducible  supine position for radiation therapy.  A BodyFix immobilization pillow was fabricated for reproducible positioning.  Surface markings were placed.  The CT images were loaded into the planning software.  The gross target volumes (GTV) and planning target volumes (PTV) were delinieated, and avoidance structures were contoured.  Treatment planning then occurred.  The radiation prescription was entered and confirmed.  A total of two complex treatment devices were fabricated in the form of the BodyFix immobilization pillow and a neck accuform cushion.  I have requested : 3D Simulation  I have requested a DVH of the following structures: targets and all normal structures near the target including both kidneys, bowel and spinal cord as noted on the radiation plan to maintain doses in adherence with established limits  PLAN:  The patient will receive 18 Gy in 1 fraction.  ________________________________  Sheral Apley [name], M.D.

## 2018-08-07 ENCOUNTER — Encounter: Payer: Self-pay | Admitting: Urology

## 2018-08-07 ENCOUNTER — Other Ambulatory Visit: Payer: Self-pay

## 2018-08-07 ENCOUNTER — Ambulatory Visit
Admission: RE | Admit: 2018-08-07 | Discharge: 2018-08-07 | Disposition: A | Discharge status: Home / Self Care | Payer: Medicare Other | Source: Ambulatory Visit | Attending: Radiation Oncology | Admitting: Radiation Oncology

## 2018-08-07 ENCOUNTER — Telehealth: Payer: Self-pay | Admitting: Radiation Oncology

## 2018-08-07 VITALS — BP 154/74 | HR 69 | Temp 98.0°F | Resp 20

## 2018-08-07 DIAGNOSIS — C7951 Secondary malignant neoplasm of bone: Secondary | ICD-10-CM | POA: Diagnosis not present

## 2018-08-07 DIAGNOSIS — Z510 Encounter for antineoplastic radiation therapy: Secondary | ICD-10-CM | POA: Diagnosis not present

## 2018-08-07 DIAGNOSIS — D492 Neoplasm of unspecified behavior of bone, soft tissue, and skin: Secondary | ICD-10-CM | POA: Diagnosis not present

## 2018-08-07 DIAGNOSIS — C50411 Malignant neoplasm of upper-outer quadrant of right female breast: Secondary | ICD-10-CM | POA: Diagnosis not present

## 2018-08-07 DIAGNOSIS — C792 Secondary malignant neoplasm of skin: Secondary | ICD-10-CM | POA: Diagnosis not present

## 2018-08-07 NOTE — Progress Notes (Signed)
At the time of the patient's most recent CT simulation for treatment planning for for the progressive T12 lesion, it was noted that she had an enlarging lesion in the vertebral body of L2 as compared to her PET scan from 03/28/2018.  The recommendation is to proceed with a single fraction of SRS to the progressive L2 lesion in addition to the T12 lesion today.  I have discussed this with the patient in detail and obtained written consent to proceed.  Nicholos Johns, MMS, PA-C [place] [facility] [place]: [phone]  Fax: [phone]

## 2018-08-07 NOTE — Telephone Encounter (Signed)
Called in 4 mg Medrol Dosepak as ordered by Dr. [name] to the [place], [place].

## 2018-08-07 NOTE — Addendum Note (Signed)
Encounter addended by: Heywood Footman, RN on: 08/07/2018 3:21 PM  Actions taken: Medication List reviewed, Order Reconciliation Section accessed, Home Medications modified

## 2018-08-07 NOTE — Addendum Note (Signed)
Encounter addended by: Heywood Footman, RN on: 08/07/2018 4:54 PM  Actions taken: Flowsheet accepted, Clinical Note Signed

## 2018-08-07 NOTE — Progress Notes (Signed)
Nurse monitoring complete following 1 of 1 intended SRS spine treatment. Vitals stable. No distress noted. Patient denies onset of any new neurologic symptoms. Instructed patient to pick up and begin taking today Medrol dose pak called into [facility], [place]. Explained the purpose of the dose pak. Instructed patient to avoid strenuous activity for the next 24 hours. Instructed patient to phone [phone] and request the radiation oncology with needs tonight related to treatment today. Patient verbalized understanding of all reviewed. Patient discharged, ambulatory and in no distress.   BP (!) 154/74   Pulse 69   Temp 98 F (36.7 C) (Oral)   Resp 20  Wt Readings from Last 3 Encounters:  08/03/18 135 lb 9.6 oz (61.5 kg)  07/17/18 134 lb 3.2 oz (60.9 kg)  07/13/18 132 lb 3.2 oz (60 kg)

## 2018-08-07 NOTE — Progress Notes (Signed)
  Radiation Oncology         (336) [phone] ________________________________  Spinal Stereotactic Radiosurgery Procedure Note  Name: Heather [name] MRN: [id number]  Date: 08/07/2018  DOB: 1946/04/15  SPECIAL TREATMENT PROCEDURE  No diagnosis found.  3D TREATMENT PLANNING AND DOSIMETRY:  The patient's radiation plan was reviewed and approved by neurosurgery and radiation oncology prior to treatment.  It showed 3-dimensional radiation distributions overlaid onto the planning CT/MRI image set.  [facility] for the target structures as well as the organs at risk were reviewed. The documentation of the 3D plan and dosimetry are filed in the radiation oncology EMR.  NARRATIVE:  CAILEE BLANKE was brought to the TrueBeam stereotactic radiation treatment machine and placed supine on the CT couch. The patient was precisely re-positioned in their BodyFix immobilization device, and the patient was set up for stereotactic radiosurgery.  Neurosurgery was present for the set-up and delivery  SIMULATION VERIFICATION:  In the couch zero-angle position, the patient underwent Exactrac imaging using the Brainlab system with orthogonal KV images to position the target accounting for translation and rotational factors.  These were carefully aligned and repeated to confirm treatment position.  Then, cone beam CT was performed to help further verify placement and make any final translational shifts.  SPECIAL TREATMENT PROCEDURE: [name] received stereotactic radiosurgery to the following targets:  The targeted metastasis in the T12 and L2 vertebral bodies were treated using 3 Rapid Arc VMAT Beams to a prescription dose of 18 Gy each to the gross disease (GTV) seen on imaging, while a secondary lower prescription dose of 14 Gy was delivered to the entirety of each directly involved marrow compartment of the gross disease (CTV).    The beams were delivered with 6 MV X-rays in the flattening filter free mode.   STEREOTACTIC TREATMENT MANAGEMENT:  Following delivery, the patient was transported to nursing in stable condition and monitored for possible acute effects.  Vital signs were recorded There were no vitals taken for this visit.. The patient tolerated treatment without significant acute effects, and was discharged to home in stable condition.    PLAN: Follow-up in one month.  ________________________________  Sheral Apley. [name], M.D.  This document serves as a record of services personally performed by Tyler Pita, MD. It was created on his behalf by Wilburn Mylar, a trained medical scribe. The creation of this record is based on the scribe's personal observations and the provider's statements to them. This document has been checked and approved by the attending provider.

## 2018-08-07 NOTE — Addendum Note (Signed)
Encounter addended by: Heywood Footman, RN on: 08/07/2018 3:09 PM  Actions taken: Order Reconciliation Section accessed

## 2018-08-17 ENCOUNTER — Encounter: Payer: Self-pay | Admitting: Radiation Oncology

## 2018-08-17 NOTE — Progress Notes (Signed)
  Radiation Oncology         [phone] ________________________________  Name: VANNAH NADAL MRN: [id number]  Date: 08/17/2018  DOB: 1945-07-17  End of Treatment Note  Diagnosis:   73 y.o. woman with spinal metastases from breast cancer primary     Indication for treatment:  Palliative       Radiation treatment dates:   08/07/2018  Site/dose:  The targeted metastasis in the T12 and L2 vertebral bodies were treated using 3 Rapid Arc VMAT Beams to a prescription dose of 18 Gy each to the gross disease (GTV) seen on imaging, while a secondary lower prescription dose of 14 Gy was delivered to the entirety of each directly involved marrow compartment of the gross disease (CTV).      Beams/energy:   The beams were delivered with 6 MV X-rays in the flattening filter free mode.  Narrative: The patient tolerated radiation treatment relatively well without significant acute effects.  Plan: The patient has completed radiation treatment. The patient will return to radiation oncology clinic for routine followup in one month. I advised her to call or return sooner if she has any questions or concerns related to her recovery or treatment. ________________________________  Sheral Apley. [name], M.D.   This document serves as a record of services personally performed by Tyler Pita, MD. It was created on his behalf by Wilburn Mylar, a trained medical scribe. The creation of this record is based on the scribe's personal observations and the provider's statements to them. This document has been checked and approved by the attending provider.

## 2018-08-23 NOTE — Progress Notes (Signed)
Patient Care Team: [name], MD as PCP - General (Family Medicine)  DIAGNOSIS:    ICD-10-CM   1. Primary cancer of upper outer quadrant of right female breast ([place]) C50.411     SUMMARY OF ONCOLOGIC HISTORY:   Primary cancer of upper outer quadrant of right female breast ([place])   03/08/2012 Initial Diagnosis    Right breast invasive ductal carcinoma ER positive PR negative HER-2 positive Ki-67 44%; another breast mass biopsied in the anterior part of the breast which was also positive for malignancy that was HER-2 negative    03/14/2012 Cancer Staging    Staging form: Breast, AJCC 7th Edition - Pathologic: Stage IV (M1) - Signed by [name], NP on 06/22/2018    05/10/2012 Surgery    Right mastectomy and axillary lymph node dissection: Multifocal disease 5 cm, 1.7 cm, 1.6 cm, ER positive PR negative HER-2 positive Ki-67 44%, 3/7 lymph nodes positive    06/14/2012 - 06/12/2013 Chemotherapy    Adjuvant chemotherapy with [place] 6 followed by Herceptin maintenance    10/25/2012 - 12/11/2012 Radiation Therapy    Adjuvant radiation therapy    02/07/2013 -  Anti-estrogen oral therapy    Letrozole 2.5 mg daily    02/14/2015 Imaging    MRI spine: Large destructive T5 lesion with severe pathologic compression fracture and extensive epidural tumor, severe spinal stenosis with moderate cord compression, tumor involvement of T4    03/18/2015 PET scan    Residual enhancing soft tissue adjacent to the spinal cord. No evidence of metastatic disease. Nonspecific uptake the left nipple    03/27/2015 - 03/30/2015 Hospital Admission    T4-6 decompression for spinal cord compression (lower extremity paralysis)    04/10/2015 -  Chemotherapy    Palliative treatment with Herceptin every 3 weeks with letrozole 2.5 mg daily    04/13/2015 - 05/19/2015 Radiation Therapy    Palliative radiation treatment to the spine    09/01/2015 Imaging    CT chest abdomen pelvis: Pathologic fracture with  posterior fusion at T5 no other evidence of metastatic disease in the chest abdomen pelvis    12/23/2015 Imaging    CT CAP: new nonspecific 0.6 cm lymph node was stated mediastinum needs follow-up CT, innumerable tiny groundglass pulmonary nodules throughout both lungs unchanged, patchy consolidation from radiation, T5 fracture, no mets    04/15/2016 PET scan    Rt para-spinal mass mildy hypermetabolic SUV 5.9, lytic cortical lesion 4.8 cm lesion left prox femur suv 8.8 favor osseus met disease    04/28/2016 - 05/12/2016 Radiation Therapy    Palliative XRT Left femur    05/24/2016 Procedure    Soft tissue mass biopsy back: Metastatic breast cancer ER 80%, PR 0%, Ki-67 50%, HER-2 positive ratio 2.25    06/09/2016 - 03/29/2017 Anti-estrogen oral therapy    Faslodex with Herceptin and Perjeta every 4 weeks    06/13/2016 - 06/15/2016 Hospital Admission    uncomplicated revision of previous thoracic instrumentation.    10/10/2016 PET scan    Interval development of new hypermetabolic 2.5 x 1.1 cm lesion posterior right eighth rib consistent with metastatic disease, stable right paraspinal lesion at T8, clustered soft tissue nodules in the posterior midline back near cervicothoracic junction smaller than before     11/03/2016 - 11/07/2016 Radiation Therapy    Palliative radiation to right eighth rib    11/04/2016 Imaging    Solitary contrast-enhancing lesion in the right cerebellum 6 x 2 mm concerning for metastatic lesion  11/24/2016 - 11/24/2016 Radiation Therapy    SRS to the brain lesion    04/18/2017 PET scan    New ill-defined rounded airspace opacity in posterior right lower lobe. Differential diagnosis includes malignancy and infectious/inflammatory process.Increased hypermetabolic lytic lesion in T8 vertebral body, consistent with bone metastasis.  Increased hypermetabolic posterior upper chest wall subcutaneous nodules, highly suspicious metastatic disease. Further improvement in right  posterior eighth rib metastasis.    05/04/2017 - 07/27/2017 Chemotherapy    Kadcyla every 3 weeks palliative chemotherapy stopped for progression    07/26/2017 Imaging    Interval increase in size of the multiple enhancing masses posteriorly overlying the spine, interval development of left axillary and mediastinal adenopathy, increase in the size of lytic lesions in T8-T9 and left ninth rib, increased groundglass opacities right lower lobe lung    07/27/2017 - 05/11/2018 Chemotherapy    Palbociclib + Faslodex + trastuzumab + Pertuzumab      04/03/2018 PET scan    PET CT scan showed progression of disease not only in the bones but also in the subcutaneous nodules.    05/15/2018 - 05/25/2018 Radiation Therapy    Palliative radiation to the back    06/01/2018 -  Chemotherapy    Enhertu (Transtuzumab Deruxtecan)     08/07/2018 - 08/07/2018 Radiation Therapy    T12 vertebral body SRS     Breast cancer metastasized to bone, right ([place])   02/14/2015 Initial Diagnosis    Breast cancer metastasized to bone, right ([place])    06/01/2018 -  Chemotherapy    The patient had palonosetron (ALOXI) injection 0.25 mg, 0.25 mg, Intravenous,  Once, 4 of 5 cycles Administration: 0.25 mg (06/01/2018), 0.25 mg (06/22/2018), 0.25 mg (07/13/2018), 0.25 mg (08/03/2018) fam-trastuzumab deruxtecan-nxki (ENHERTU) 332 mg in dextrose 5 % 100 mL chemo infusion, 5.4 mg/kg = 332 mg, Intravenous,  Once, 4 of 5 cycles Administration: 332 mg (06/01/2018), 332 mg (06/22/2018), 332 mg (07/13/2018), 332 mg (08/03/2018)  for chemotherapy treatment.      CHIEF COMPLIANT: Cycle 5 Enhertu  INTERVAL HISTORY: Heather [name] is a 73 y.o. with above-mentioned history of metastatic breast cancer who is hereforcycle5of chemotherapy with trastuzumab-Deruxtecan (Enhertu). She presents to the clinic alone today for treatment.  She continues to experience hair loss and hair thinning.  She is also feeling aches and pains in the neck of the different  muscles.  Her biggest challenge is also constipation.  REVIEW OF SYSTEMS:   Constitutional: Denies fevers, chills or abnormal weight loss Eyes: Denies blurriness of vision Ears, nose, mouth, throat, and face: Denies mucositis or sore throat Respiratory: Denies cough, dyspnea or wheezes Cardiovascular: Denies palpitation, chest discomfort Gastrointestinal: Constipation Skin: Denies abnormal skin rashes Lymphatics: Denies new lymphadenopathy or easy bruising Neurological: Uses a cane to walk Behavioral/Psych: Mood is stable, no new changes  Extremities: No lower extremity edema Breast: denies any pain or lumps or nodules in either breasts All other systems were reviewed with the patient and are negative.  I have reviewed the past medical history, past surgical history, social history and family history with the patient and they are unchanged from previous note.  ALLERGIES:  is allergic to other; acyclovir and related; chlorhexidine; zanaflex [tizanidine hcl]; codeine; keflex [cephalexin]; naprosyn [naproxen]; oxycodone; tramadol; and tussin [guaifenesin].  MEDICATIONS:  Current Outpatient Medications  Medication Sig Dispense Refill  . ALPRAZolam (XANAX) 0.25 MG tablet Take 1 tablet by mouth as needed.    . Calcium Carbonate Antacid (TUMS PO) Take 2 tablets by mouth  2 (two) times daily as needed (acid reflux).    . Cholecalciferol (VITAMIN D) 2000 UNITS tablet Take 2,000 Units by mouth daily.    Marland Kitchen gabapentin (NEURONTIN) 600 MG tablet Take 1 tablet (600 mg total) by mouth as directed. Take 1 tablet in the AM and afternoon and 1 1/2 tabs at bedtime (Patient taking differently: Take 600 mg by mouth 3 (three) times daily. Take 1 tablet in the AM and afternoon and 1 1/2 tabs at bedtime) 315 tablet 2  . HYDROcodone-acetaminophen (NORCO/VICODIN) 5-325 MG tablet Take 1 tablet by mouth every 6 (six) hours as needed for moderate pain. (Patient not taking: Reported on 04/09/2018) 30 tablet 0  .  lidocaine-prilocaine (EMLA) cream Apply to affected area once (Patient taking differently: Apply 1 application topically every 30 (thirty) days. Apply to port prior to infusions) 30 g 3  . omeprazole (PRILOSEC) 40 MG capsule TAKE 1 CAPSULE BY MOUTH ONCE DAILY FOR 30 DAYS  5  . ondansetron (ZOFRAN) 8 MG tablet Take 1 tablet (8 mg total) by mouth every 8 (eight) hours as needed for nausea or vomiting. 20 tablet 0  . OVER THE COUNTER MEDICATION Place 1 drop into both eyes 2 (two) times daily as needed (dry eyes). Over the counter lubricating eye drops    . polyethylene glycol (MIRALAX / GLYCOLAX) packet Take 17 g by mouth daily as needed.    . Probiotic Product (PROBIOTIC DAILY PO) Take 1 capsule by mouth daily.     Marland Kitchen spironolactone (ALDACTONE) 25 MG tablet Take 25 mg by mouth daily.     No current facility-administered medications for this visit.    Facility-Administered Medications Ordered in Other Visits  Medication Dose Route Frequency Provider Last Rate Last Dose  . sodium chloride 0.9 % injection 10 mL  10 mL Intravenous PRN [name], MD   10 mL at 04/26/17 [id number]    PHYSICAL EXAMINATION: ECOG PERFORMANCE STATUS: 1 - Symptomatic but completely ambulatory  Vitals:   08/24/18 1000  BP: 127/70  Pulse: 75  Resp: 18  Temp: 98.7 F (37.1 C)  SpO2: 100%   Filed Weights   08/24/18 1000  Weight: 131 lb 1.6 oz (59.5 kg)    GENERAL: alert, no distress and comfortable SKIN: skin color, texture, turgor are normal, no rashes or significant lesions EYES: normal, Conjunctiva are pink and non-injected, sclera clear OROPHARYNX: no exudate, no erythema and lips, buccal mucosa, and tongue normal  NECK: supple, thyroid normal size, non-tender, without nodularity LYMPH: no palpable lymphadenopathy in the cervical, axillary or inguinal LUNGS: clear to auscultation and percussion with normal breathing effort HEART: regular rate & rhythm and no murmurs and no lower extremity edema ABDOMEN:  abdomen soft, non-tender and normal bowel sounds MUSCULOSKELETAL: no cyanosis of digits and no clubbing  NEURO: alert & oriented x 3 with fluent speech, no focal motor/sensory deficits EXTREMITIES: No lower extremity edema  LABORATORY DATA:  I have reviewed the data as listed CMP Latest Ref Rng & Units 08/24/2018 08/03/2018 07/13/2018  Glucose 70 - 99 mg/dL 113(H) 81 87  BUN 8 - 23 mg/dL _0 Creatinine 0.44 - 1.00 mg/dL 0.81 0.79 0.84  Sodium 135 - 145 mmol/L 137 140 137  Potassium 3.5 - 5.1 mmol/L 3.8 4.3 3.9  Chloride 98 - 111 mmol/L 101 105 102  CO2 22 - 32 mmol/L _1 Calcium 8.9 - 10.3 mg/dL 9.3 9.5 9.7  Total Protein 6.5 - 8.1 g/dL 6.9 7.2 7.0  Total Bilirubin 0.3 - 1.2 mg/dL 0.3 0.3 0.3  Alkaline Phos 38 - 126 U/L 73 82 80  AST 15 - 41 U/L 27 35 36  ALT 0 - 44 U/L _0 Lab Results  Component Value Date   WBC 2.5 (L) 08/24/2018   HGB 11.3 (L) 08/24/2018   HCT 35.1 (L) 08/24/2018   MCV 94.6 08/24/2018   PLT 159 08/24/2018   NEUTROABS 1.7 08/24/2018    ASSESSMENT & PLAN:  Primary cancer of upper outer quadrant of right female breast ([place]) Right breast invasive ductal carcinoma ER positive PR negative HER-2 positive Ki-67 44% multifocal disease 3/7 lymph nodes positive T2 N1 M0 stage IIB status post adjuvant chemotherapy with TCH followed by Herceptin maintenance, adjuvant radiation therapy and Letrozole. MRI 02/14/2015: T5 large destructive lesion with pathologic compression fracture and extensive epidural tumor, involvement of T4, excision metastatic carcinoma ER 60%, PR 0%, HER-2 positive ratio 2.71 average copy #7.45 03/27/2015: T4-6 decompression surgery for spinal cord compression PET-CT 12/10/17Rt para-spinal mass mildy hypermetabolic SUV 5.9, lytic cortical lesion 4.8 cm lesion left prox femur suv 8.8 favor osseus met disease Echo 04/12/2017 shows well preserved ejection fraction of 55-60%  Treatment summary: 1. Resumed anastrozole 05/21/2015, but  it has caused significant hair loss so we switched her to exemestane 25 mg once daily 07/23/2015 2. Herceptin every 3 week started 04/10/2015 3. Bone metastases: Zometa every 3 months-next duelate May/early June 4. Radiation therapy to the spine (Dr. [name]) 04/13/2015 to 05/19/2015 5. Radiation therapy to left femur 04/28/2016 to 05/12/2016  6. SRS to the brain lesion 11/24/2016 7.Kadcyla started 05/05/2017 discontinued 07/27/2017 8.Palbociclib +Faslodex +trastuzumab+ Pertuzumabstarted 08/10/2017, stopped 04/04/2018 9. Enhertu started 06/01/18 -------------------------------------------------------------------------------------------------------------------------- PET CT scan 04/03/2018: Multiple new bone lesions including skull, right L2, T12, left distal femur, right distal femur, right acromium. Posterior upper chest wall lesions increase in size and nodularity.  Current treatment:Cycle4day 1Enhertu (Fam-trastuzumab-Durextecan) Echo 04/09/2018 shows EF of 55-60%.  Toxicities: 1. Dry eyes/irritation/infection: she will continue to follow with ophthalmology. 2.Severe fatigue 3. Pain: controlled right now with Gabapentin. 4.Hair loss  MRI brain and thoracic spine on 07/12/2018:No parenchymal metastases. 3 skull metastases 5 to 14 mm stable to slightly increased since December MRI thoracic spine: Enlarged left paracentral T12 vertebral body metastases 20 mm, posterior subcutaneous meds appear regressed.  XRT to T12 07/19/2018  The next set of scans will be delayed until June because of the coronavirus pandemic. We discussed different treatment options including neratinib with capecitabine as an alternative treatment option if she cannot tolerate this treatment or if she progresses on this treatment.  Return to clinic in3weeks for cycle5.    No orders of the defined types were placed in this encounter.  The patient has a good understanding of the overall plan.  she agrees with it. she will call with any problems that may develop before the next visit here.  [name], MD 08/24/2018  Julious Oka Dorshimer am acting as scribe for Dr. [name].  I have reviewed the above documentation for accuracy and completeness, and I agree with the above.

## 2018-08-24 ENCOUNTER — Other Ambulatory Visit: Payer: Self-pay

## 2018-08-24 ENCOUNTER — Encounter: Payer: Self-pay | Admitting: Hematology and Oncology

## 2018-08-24 ENCOUNTER — Inpatient Hospital Stay: Payer: Medicare Other

## 2018-08-24 ENCOUNTER — Inpatient Hospital Stay: Payer: Medicare Other | Attending: Hematology and Oncology

## 2018-08-24 ENCOUNTER — Inpatient Hospital Stay (HOSPITAL_BASED_OUTPATIENT_CLINIC_OR_DEPARTMENT_OTHER): Payer: Medicare Other | Admitting: Hematology and Oncology

## 2018-08-24 DIAGNOSIS — Z79899 Other long term (current) drug therapy: Secondary | ICD-10-CM

## 2018-08-24 DIAGNOSIS — C7951 Secondary malignant neoplasm of bone: Secondary | ICD-10-CM

## 2018-08-24 DIAGNOSIS — Z9221 Personal history of antineoplastic chemotherapy: Secondary | ICD-10-CM

## 2018-08-24 DIAGNOSIS — Z923 Personal history of irradiation: Secondary | ICD-10-CM

## 2018-08-24 DIAGNOSIS — Z95828 Presence of other vascular implants and grafts: Secondary | ICD-10-CM

## 2018-08-24 DIAGNOSIS — Z170 Estrogen receptor positive status [ER+]: Secondary | ICD-10-CM | POA: Insufficient documentation

## 2018-08-24 DIAGNOSIS — C50411 Malignant neoplasm of upper-outer quadrant of right female breast: Secondary | ICD-10-CM

## 2018-08-24 DIAGNOSIS — G9529 Other cord compression: Secondary | ICD-10-CM

## 2018-08-24 DIAGNOSIS — Z5112 Encounter for antineoplastic immunotherapy: Secondary | ICD-10-CM | POA: Insufficient documentation

## 2018-08-24 DIAGNOSIS — C50911 Malignant neoplasm of unspecified site of right female breast: Secondary | ICD-10-CM

## 2018-08-24 LAB — CBC WITH DIFFERENTIAL (CANCER CENTER ONLY)
Abs Immature Granulocytes: 0.01 10*3/uL (ref 0.00–0.07)
Basophils Absolute: 0.1 10*3/uL (ref 0.0–0.1)
Basophils Relative: 2 %
Eosinophils Absolute: 0.2 10*3/uL (ref 0.0–0.5)
Eosinophils Relative: 6 %
HCT: 35.1 % — ABNORMAL LOW (ref 36.0–46.0)
Hemoglobin: 11.3 g/dL — ABNORMAL LOW (ref 12.0–15.0)
Immature Granulocytes: 0 %
Lymphocytes Relative: 9 %
Lymphs Abs: 0.2 10*3/uL — ABNORMAL LOW (ref 0.7–4.0)
MCH: 30.5 pg (ref 26.0–34.0)
MCHC: 32.2 g/dL (ref 30.0–36.0)
MCV: 94.6 fL (ref 80.0–100.0)
Monocytes Absolute: 0.4 10*3/uL (ref 0.1–1.0)
Monocytes Relative: 16 %
Neutro Abs: 1.7 10*3/uL (ref 1.7–7.7)
Neutrophils Relative %: 67 %
Platelet Count: 159 10*3/uL (ref 150–400)
RBC: 3.71 MIL/uL — ABNORMAL LOW (ref 3.87–5.11)
RDW: 17.5 % — ABNORMAL HIGH (ref 11.5–15.5)
WBC Count: 2.5 10*3/uL — ABNORMAL LOW (ref 4.0–10.5)
nRBC: 0 % (ref 0.0–0.2)

## 2018-08-24 LAB — CMP (CANCER CENTER ONLY)
ALT: 14 U/L (ref 0–44)
AST: 27 U/L (ref 15–41)
Albumin: 3.3 g/dL — ABNORMAL LOW (ref 3.5–5.0)
Alkaline Phosphatase: 73 U/L (ref 38–126)
Anion gap: 10 (ref 5–15)
BUN: 11 mg/dL (ref 8–23)
CO2: 26 mmol/L (ref 22–32)
Calcium: 9.3 mg/dL (ref 8.9–10.3)
Chloride: 101 mmol/L (ref 98–111)
Creatinine: 0.81 mg/dL (ref 0.44–1.00)
GFR, Est AFR Am: >60 mL/min (ref >60)
GFR, Estimated: >60 mL/min (ref >60)
Glucose, Bld: 113 mg/dL — ABNORMAL HIGH (ref 70–99)
Potassium: 3.8 mmol/L (ref 3.5–5.1)
Sodium: 137 mmol/L (ref 135–145)
Total Bilirubin: 0.3 mg/dL (ref 0.3–1.2)
Total Protein: 6.9 g/dL (ref 6.5–8.1)

## 2018-08-24 MED ORDER — CYANOCOBALAMIN 1000 MCG/ML IJ SOLN
INTRAMUSCULAR | Status: AC
  Filled 2018-08-24: qty 1

## 2018-08-24 MED ORDER — FAM-TRASTUZUMAB DERUXTECAN-NXKI CHEMO 100 MG IV SOLR
5.4000 mg/kg | Freq: Once | INTRAVENOUS | Status: AC
  Administered 2018-08-24: 332 mg via INTRAVENOUS
  Filled 2018-08-24: qty 16.6

## 2018-08-24 MED ORDER — HEPARIN SOD (PORK) LOCK FLUSH 100 UNIT/ML IV SOLN
500.0000 [IU] | Freq: Once | INTRAVENOUS | Status: AC | PRN
  Administered 2018-08-24: 500 [IU]
  Filled 2018-08-24: qty 5

## 2018-08-24 MED ORDER — ZOLEDRONIC ACID 4 MG/100ML IV SOLN
4.0000 mg | Freq: Once | INTRAVENOUS | Status: AC
  Administered 2018-08-24: 4 mg via INTRAVENOUS
  Filled 2018-08-24: qty 100

## 2018-08-24 MED ORDER — CYANOCOBALAMIN 1000 MCG/ML IJ SOLN: 1000.0000 ug | Freq: Once | INTRAMUSCULAR | Status: DC

## 2018-08-24 MED ORDER — ACETAMINOPHEN 325 MG PO TABS
ORAL_TABLET | ORAL | Status: AC
  Filled 2018-08-24: qty 2

## 2018-08-24 MED ORDER — PALONOSETRON HCL INJECTION 0.25 MG/5ML
INTRAVENOUS | Status: AC
  Filled 2018-08-24: qty 5

## 2018-08-24 MED ORDER — SODIUM CHLORIDE 0.9 % IV SOLN
Freq: Once | INTRAVENOUS | Status: AC
  Administered 2018-08-24: 12:00:00 via INTRAVENOUS
  Filled 2018-08-24: qty 250

## 2018-08-24 MED ORDER — SODIUM CHLORIDE 0.9% FLUSH
10.0000 mL | INTRAVENOUS | Status: DC | PRN
  Administered 2018-08-24: 10 mL
  Filled 2018-08-24: qty 10

## 2018-08-24 MED ORDER — DEXAMETHASONE SODIUM PHOSPHATE 10 MG/ML IJ SOLN
10.0000 mg | Freq: Once | INTRAMUSCULAR | Status: AC
  Administered 2018-08-24: 10 mg via INTRAVENOUS

## 2018-08-24 MED ORDER — CYANOCOBALAMIN 1000 MCG/ML IJ SOLN
1000.0000 ug | Freq: Once | INTRAMUSCULAR | Status: AC
  Administered 2018-08-24: 11:00:00 1000 ug via INTRAMUSCULAR

## 2018-08-24 MED ORDER — PALONOSETRON HCL INJECTION 0.25 MG/5ML
0.2500 mg | Freq: Once | INTRAVENOUS | Status: AC
  Administered 2018-08-24: 0.25 mg via INTRAVENOUS

## 2018-08-24 MED ORDER — DEXAMETHASONE SODIUM PHOSPHATE 10 MG/ML IJ SOLN
INTRAMUSCULAR | Status: AC
  Filled 2018-08-24: qty 1

## 2018-08-24 MED ORDER — ACETAMINOPHEN 325 MG PO TABS
650.0000 mg | ORAL_TABLET | Freq: Once | ORAL | Status: AC
  Administered 2018-08-24: 650 mg via ORAL

## 2018-08-24 MED ORDER — DEXTROSE 5 % IV SOLN
Freq: Once | INTRAVENOUS | Status: AC
  Administered 2018-08-24: 11:00:00 via INTRAVENOUS
  Filled 2018-08-24: qty 250

## 2018-08-24 MED ORDER — SODIUM CHLORIDE 0.9% FLUSH
10.0000 mL | Freq: Once | INTRAVENOUS | Status: AC
  Administered 2018-08-24: 10 mL
  Filled 2018-08-24: qty 10

## 2018-08-24 NOTE — Assessment & Plan Note (Signed)
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 MRI 02/14/2015: T5 large destructive lesion with pathologic compression fracture and extensive epidural tumor, involvement of T4, excision metastatic carcinoma ER 60%, PR 0%, HER-2 positive ratio 2.71 average copy #7.45 03/27/2015: T4-6 decompression surgery for spinal cord compression PET-CT 12/10/17Rt para-spinal mass mildy hypermetabolic SUV 5.9, lytic cortical lesion 4.8 cm lesion left prox femur suv 8.8 favor osseus met disease Echo 04/12/2017 shows well preserved ejection fraction of 55-60%  Treatment summary: 1. Resumed anastrozole 05/21/2015, but it has caused significant hair loss so we switched her to exemestane 25 mg once daily 07/23/2015 2. Herceptin every 3 week started 04/10/2015 3. Bone metastases: Zometa every 3 months-next duelate May/early June 4. Radiation therapy to the spine (Dr. [name]) 04/13/2015 to 05/19/2015 5. Radiation therapy to left femur 04/28/2016 to 05/12/2016  6. SRS to the brain lesion 11/24/2016 7.Kadcyla started 05/05/2017 discontinued 07/27/2017 8.Palbociclib +Faslodex +trastuzumab+ Pertuzumabstarted 08/10/2017, stopped 04/04/2018 9. Enhertu started 06/01/18 -------------------------------------------------------------------------------------------------------------------------- PET CT scan 04/03/2018: Multiple new bone lesions including skull, right L2, T12, left distal femur, right distal femur, right acromium. Posterior upper chest wall lesions increase in size and nodularity.  Current treatment:Cycle4day 1Enhertu (Fam-trastuzumab-Durextecan) Echo 04/09/2018 shows EF of 55-60%.  Toxicities: 1. Dry eyes/irritation/infection: she will continue to follow with  ophthalmology. 2.Severe fatigue 3. Pain: controlled right now with Gabapentin. 4.Hair loss  MRI brain and thoracic spine on 07/12/2018:No parenchymal metastases. 3 skull metastases 5 to 14 mm stable to slightly increased since December MRI thoracic spine: Enlarged left paracentral T12 vertebral body metastases 20 mm, posterior subcutaneous meds appear regressed.  XRT to T12 07/19/2018  Our plan is to do scans after 6 cycles. We discussed different treatment options including neratinib with capecitabine as an alternative treatment option if she cannot tolerate this treatment or if she progresses on this treatment.  Return to clinic in3weeks for cycle5.

## 2018-08-24 NOTE — Addendum Note (Signed)
Addended by: [name] on: 08/24/2018 11:23 AM   Modules accepted: Orders

## 2018-09-03 ENCOUNTER — Encounter

## 2018-09-11 NOTE — Assessment & Plan Note (Signed)
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 MRI 02/14/2015: T5 large destructive lesion with pathologic compression fracture and extensive epidural tumor, involvement of T4, excision metastatic carcinoma ER 60%, PR 0%, HER-2 positive ratio 2.71 average copy #7.45 03/27/2015: T4-6 decompression surgery for spinal cord compression PET-CT 12/10/17Rt para-spinal mass mildy hypermetabolic SUV 5.9, lytic cortical lesion 4.8 cm lesion left prox femur suv 8.8 favor osseus met disease Echo 04/12/2017 shows well preserved ejection fraction of 55-60%  Treatment summary: 1. Resumed anastrozole 05/21/2015, but it has caused significant hair loss so we switched her to exemestane 25 mg once daily 07/23/2015 2. Herceptin every 3 week started 04/10/2015 3. Bone metastases: Zometa every 3 months-next duelate May/early June 4. Radiation therapy to the spine (Dr. [name]) 04/13/2015 to 05/19/2015 5. Radiation therapy to left femur 04/28/2016 to 05/12/2016  6. SRS to the brain lesion 11/24/2016 7.Kadcyla started 05/05/2017 discontinued 07/27/2017 8.Palbociclib +Faslodex +trastuzumab+ Pertuzumabstarted 08/10/2017, stopped 04/04/2018 9. Enhertu started 06/01/18 -------------------------------------------------------------------------------------------------------------------------- PET CT scan 04/03/2018: Multiple new bone lesions including skull, right L2, T12, left distal femur, right distal femur, right acromium. Posterior upper chest wall lesions increase in size and nodularity.  Current treatment:Cycle4day 1Enhertu (Fam-trastuzumab-Durextecan) Echo 04/09/2018 shows EF of 55-60%.  Toxicities: 1. Dry eyes/irritation/infection: she will continue to follow with  ophthalmology. 2.Severe fatigue 3. Pain: controlled right now with Gabapentin. 4.Hair loss  MRI brain and thoracic spine on 07/12/2018:No parenchymal metastases. 3 skull metastases 5 to 14 mm stable to slightly increased since December MRI thoracic spine: Enlarged left paracentral T12 vertebral body metastases 20 mm, posterior subcutaneous meds appear regressed.  XRT to T12 07/19/2018  The next set of scans will be delayed until June because of the coronavirus pandemic. Return to clinic in 3 weeks for cycle 6

## 2018-09-12 ENCOUNTER — Ambulatory Visit: Admission: RE | Admit: 2018-09-12 | Payer: Medicare Other | Source: Ambulatory Visit | Admitting: Urology

## 2018-09-12 ENCOUNTER — Telehealth: Payer: Self-pay | Admitting: Urology

## 2018-09-12 ENCOUNTER — Other Ambulatory Visit: Payer: Self-pay | Admitting: *Deleted

## 2018-09-12 ENCOUNTER — Inpatient Hospital Stay
Admission: RE | Admit: 2018-09-12 | Discharge: 2018-09-12 | Disposition: A | Discharge status: Home / Self Care | Payer: Medicare Other | Source: Ambulatory Visit | Attending: Urology | Admitting: Urology

## 2018-09-12 DIAGNOSIS — C50411 Malignant neoplasm of upper-outer quadrant of right female breast: Secondary | ICD-10-CM

## 2018-09-12 NOTE — Telephone Encounter (Signed)
Radiation Oncology         [phone] ________________________________  Name: Heather [name] MRN: [id number]  Date: 09/12/2018  DOB: 10-Jun-1945  Post-treatment Note  CC: Heather Stains, MD  No ref. provider found  Diagnosis:   73 y.o. woman with ER positive, PR negative, HER-2 positive metastatic breast cancer with multifocal metastases involving the spine, bony skeleton, lung, and brain.   Interval Since Last Radiation: 1 month 08/07/18:  SRS Spine/ The targeted metastases in theT12 and L2vertebral bodiesweretreatedto a prescription dose of 18Gy in 1 fraction.   05/14/2018 - 05/25/2018:  Chest Wall, Right Upper Posterior subcutaneous lesions / treated to 30 Gy delivered in 10 fractions of 3 Gy  05/18/2017 - 05/31/2017:  The thoracic spine (T7-T9) was treated to 30 Gy in 10 fractions of 3 Gy.  6/27-7/18/18 SRS and palliative treatment: 1.  The rib 8th was treated to 30 Gy in 10 fractions 2.  Right cerebellar 6 mm target was treated using 3 Dynamic Conformal Arcs to a prescription dose of 20 Gy in one fraction  04/27/16-05/12/16: Left femur/ 30 Gy in 10 fractions  04/13/2015-05/19/2015 Postop:  55 Gy in 25 fractions of 2.2 Gy to the thoracic spine  10/25/12-12/11/12: 50.4 Gy to the right chest wall and regional lymph nodes in 28 fractions with a 10 Gy boost to the right chest wall in 1 session.  Narrative: Heather [name] is a well known patient to our service with a history of metastatic breast cancer originally treated in 2014 to the chest wall and regional lymph nodes. She developed recurrent disease within the thoracic spine and subsequently has undergone 3 surgeries, in addition to radiotherapy, with her most recent surgery being on 06/13/2016 where she had removal of thoracic hardware and replacement of pedicle screws with stabilization.  Of note previously placed screws between T3, T4 T6 and T7 were identified and dissection was carried out inferiorly to T8, T9-T10 and T11, T8 through  T11 pedicle screw trajectories were placed bilaterally. She recovered well since treatment to her spine as she had been paralyzed in 2016, and with rigorous therapy has been able to make an impressive recovery. She continues on systemic therapy under the care of [name] and also follows along in the brain and spine oncology conference. PET scan on 10/10/16 revealed a new hypermetabolic 2.5 x 1.1 cm lesion in the posterior right 8th rib. She also mentioned having new onset headaches, dizziness, and an episode of visual change,so an MRI brain was performed 11/04/16 demonstrating a solitary contrast-enhancing lesion within the right cerebellum, measuring 6 x 2 mm and lying just beneath the right tentorial leaflet and concerning for a metastatic lesion.  This solitary lesion was confirmed with SRS protocol MRI brain on 11/16/16 with no new lesions noted. This lesion was treated with SRS on 11/23/16 which she tolerated very well. She also completed palliative radiotherapy to the right eighth rib from 11/02/16 - 11/23/16 with significant improvement in her pain. She remained on Herceptin, Perjeta, Faslodex and Zometa for systemic disease under the care and direction of [name].  She developed increased pain in the T8 region and also had PET positive findings in this region, as well as hypermetabolic right lower lobe mass concerning for progressive metastatic disease on follow up imaging 04/18/2017, despite her current systemic treatment. Her systemic therapy was switched to Kadcyla on 05/05/17 with discontinuation of Faslodex, Herceptin, and Perjeta.  She proceeded with palliative radiotherapy to 30 Gy in 10 fractions to the  thoracic spine (T7-T9) from 05/18/2017 - 05/31/2017.  This treatment was tolerated very well.  Repeat brain MRI performed 06/15/17 showed no new or progressive contrast enhancing lesions and no residual abnormality at the site of the previous treated right cerebellar lesion but Chest CT on 07/26/17  showed clear disease progression with interval increase in size of the multiple enhancing masses posteriorly overlying the spine, interval development of left axillary and mediastinal adenopathy, increase in the size of lytic lesions in T8-T9 and left ninth rib, as well as increased groundglass opacities right lower lobe lung. Therefore, she was switched to Palbociclib + Faslodex + trastuzumab + Pertuzumab, started 08/10/17.   MRI T-spine on 09/21/17 showed the known T12 lesion had slightly progressed by a few millimeters from 03/15/17 and there was a new lesion at L2 which was not present on lumbar MRI from 07/13/16. Follow up systemic imaging on 12/13/17, showed a good response to treatment with improved LAN and decreased subcutaneous nodules in the upper thoracic region.  Follow up brain MRI imaging has continued to show interval increase in size the right parietal calvarium metastasis with stable appearance in size of a right frontal calvarial metastasis and no parenchymal or dural metastases. PET scan from 04/03/2018 unfortunately showed significant disease progression despite current systemic therapy.  Her systemic chemotherapy was discontinued on 04/04/2018 due to disease progression.  She was switched to [facility] (Fam-trastuzumab-Durextecan) q 3 weeks beginning 06/01/18.  Repeat MRI imaging of the brain and thoracic spine was performed on 07/12/2018.  Brain imaging demonstrated 3 skull metastases ranging from 5 to 14 mm in diameter which appeared stable to slightly increased since December 2019 but no new metastases or brain parenchymal or dural metastases identified.  On thoracic spine imaging there was enlargement of the left paracentral T12 vertebral body metastasis increased from 14 mm in May 2019 to 20 mm currently without associated pathologic fracture and otherwise stable appearance of previously treated lesions in the thoracic spine with no new disease noted.  The posterior subcutaneous nodular metastases  appear regressed.  Her recent imaging studies were reviewed and discussed at the multidisciplinary brain and spine conference on 07/16/2018 and the consensus recommendation was to proceed with stereotactic radiosurgery [facility]) to the progressive disease at T12 which was completed on 08/07/18.  At the time of her CT [facility] for treatment planning, it was noted that she had an enlarging lesion in the vertebral body of L2 as compared to her PET scan from 03/28/2018 so this lesion was included in the planning and was treated with a single fraction SRS at the time of T12 treatment as well. She tolerated the treatment well and did not experience any ill side effects.  INTERVAL HISTORY:   I spoke with the patient to conduct her routine scheduled 1 month follow up visit via telephone to spare the patient unnecessary potential exposure in the healthcare setting during the current COVID-19 pandemic.  The patient was notified in advance and gave permission to proceed with this visit format.  She reports that she is doing well overall and has not experienced any ill side effects associated with her recent SRS spine treatment.  She denies back pain currently.  She has longstanding weakness in bilateral LEs, unchanged recently and she associates this with her inactivity due to pronounced fatigue following systemic treatments.  She reports that it begins to improve approximately 2 weeks after her treatments when she is able to become more mobile but resumes following each treatment every 3 weeks.  She denies any new or increased paraesthesias in the lower extremities but reports neuropathy in bilateral UEs as well as "nerve pain" in the right axilla that travels down her right side/torso.  She also reports right>left jaw pain that is more pronounced over the past 2 weeks.  She reports that her dentist has told her previously that she had some loss of bone in the right jaw based on xray evaluation.  She is taking Zometa q 3 months and  will discuss this with [name] at her upcoming appointment on 09/14/18.  She has continued her systemic therapy with ENHERTU under the care and direction of [name] having completed 4 cycles as of 08/24/2018 and the current plan is to complete an addition 1-2 cycles prior to repeat systemic imaging.   On review of systems, the patient reports that she continues doing well overall.  She continues with moderate fatigue which is tolerable and not significantly interfering with her daily activities aside from the first 2 weeks after systemic treatments.  She remains as active as she can be.  She has also noticed significant improvement in the size of the subcutaneous nodules and in the burning/tingling pain that radiated from the left aspect of the spine underneath her breast since completing radiotherapy.  She has not had recent changes in visual or auditory acuity, tinnitus, increased imbalance, tremor or seizure activity. She denies recent headaches, sternal chest pain, shortness of breath, cough, fevers, chills, night sweats, or unintended weight changes. She denies any bowel or bladder disturbances, and denies abdominal pain, nausea or vomiting. She denies any new musculoskeletal or joint aches or pains, new skin lesions or concerns aside from those mentioned above in the HPI.  A complete review of systems is obtained and is otherwise negative.  Past Medical History:  Past Medical History:  Diagnosis Date   Anemia    hx of   Anxiety    r/t updated surgery   Breast cancer ([place])    right   Cataract    immature-not sure of which eye   Complication of anesthesia    pt states she is sensitive to meds   Dizziness    has been going on for 21month;medical MD aware   Genetic testing 07/11/2016   Test Results: Normal; No pathogenic mutations detected  Genes Analyzed: 43 genes on [employer] Common Cancers panel (APC, ATM, AXIN2, BARD1, BMPR1A, BRCA1, BRCA2, BRIP1, CDH1, CDKN2A, CHEK2, DICER1, EPCAM,  GREM1, HOXB13, KIT, MEN1, MLH1, MSH2, MSH6, MUTYH, NBN, NF1, PALB2, PDGFRA, PMS2, POLD1, POLE, PTEN, RAD50, RAD51C, RAD51D, SDHA, SDHB, SDHC, SDHD, SMAD4, SMARCA4, STK11, TP53, TSC1, TSC2, VHL).   GERD (gastroesophageal reflux disease)    Tums prn   H. pylori infection    H/O hiatal hernia    Hemorrhoids    History of UTI    Hx of radiation therapy 10/25/12- 12/11/12   right chest wall/regional lymph nodes 5040 cGy, 28 sessions, right chest wall boost 1000 cGy 1 session   Hx: UTI (urinary tract infection)    Hyperlipidemia    but not on meds;diet and exercise controlled   Hypertension    recently started Aldactone    Insomnia    takes Melatoniin daily   Metastasis to spinal column ([facility])    T5, Left  Femur cancer- radiation    Neuropathy    "from back surgery"   PONV (postoperative nausea and vomiting)    pt experienced hair loss, confusion and combative- 02/15/15   Right knee pain  Right shoulder pain    Skin cancer    squamous, Nodule to back - "same cancer as breast."   Urinary frequency    d/t taking Aldactone    Past Surgical History: Past Surgical History:  Procedure Laterality Date   COLONOSCOPY     cyst removed from left breast  1970   DECOMPRESSIVE LUMBAR LAMINECTOMY LEVEL 4 N/A 02/15/2015   Procedure: DECOMPRESSION T5 AND T3-T7 STABALIZATION;  Surgeon: Consuella Lose, MD;  Location: [place] [facility];  Service: Neurosurgery;  Laterality: N/A;   ESOPHAGOGASTRODUODENOSCOPY     LAMINECTOMY N/A 03/27/2015   Procedure: Thoracic Four-Thoracic Six Laminectomy for tumor;  Surgeon: Consuella Lose, MD;  Location: [place] [facility];  Service: Neurosurgery;  Laterality: N/A;  T4-T6 Laminectomy   left wrist surgery   2004   with plate   MASTECTOMY MODIFIED RADICAL  05/10/2012   Procedure: MASTECTOMY MODIFIED RADICAL;  Surgeon: Merrie Roof, MD;  Location: [place];  Service: General;  Laterality: Right;  RIGHT MODIFIED RADICAL MASTECTOMY   MASTECTOMY, RADICAL  Right    MINOR BREAST BIOPSY Left 05/16/2016   Procedure: EXCISION OF BACK NODULE;  Surgeon: Autumn Messing III, MD;  Location: [place];  Service: General;  Laterality: Left;   Nodule  back  05/17/2015   PORTACATH PLACEMENT  05/10/2012   Procedure: INSERTION PORT-A-CATH;  Surgeon: Merrie Roof, MD;  Location: [place];  Service: General;  Laterality: Left;    Social History:  Social History   Socioeconomic History   Marital status: Married    Spouse name: Not on file   Number of children: 2   Years of education: Not on file   Highest education level: Not on file  Occupational History   Not on file  Social Needs   Financial resource strain: Not on file   Food insecurity:    Worry: Not on file    Inability: Not on file   Transportation needs:    Medical: Not on file    Non-medical: Not on file  Tobacco Use   Smoking status: Never Smoker   Smokeless tobacco: Never Used  Substance and Sexual Activity   Alcohol use: No   Drug use: No   Sexual activity: Yes    Birth control/protection: Post-menopausal  Lifestyle   Physical activity:    Days per week: Not on file    Minutes per session: Not on file   Stress: Not on file  Relationships   Social connections:    Talks on phone: Not on file    Gets together: Not on file    Attends religious service: Not on file    Active member of club or organization: Not on file    Attends meetings of clubs or organizations: Not on file    Relationship status: Not on file   Intimate partner violence:    Fear of current or ex partner: Not on file    Emotionally abused: Not on file    Physically abused: Not on file    Forced sexual activity: Not on file  Other Topics Concern   Not on file  Social History Narrative   Not on file    Family History: Family History  Problem Relation Age of Onset   Leukemia Mother 92       deceased 66   Stroke Father    Diabetes Mellitus II Father    Prostate cancer  Brother 51       currently 67  Stomach cancer Maternal Grandmother 30       deceased 70   Prostate cancer Brother 78       currently 70     ALLERGIES:  is allergic to other; acyclovir and related; chlorhexidine; zanaflex [tizanidine hcl]; codeine; keflex [cephalexin]; naprosyn [naproxen]; oxycodone; tramadol; and tussin [guaifenesin].  Meds: Current Outpatient Medications  Medication Sig Dispense Refill   ALPRAZolam (XANAX) 0.25 MG tablet Take 1 tablet by mouth as needed.     Calcium Carbonate Antacid (TUMS PO) Take 2 tablets by mouth 2 (two) times daily as needed (acid reflux).     Cholecalciferol (VITAMIN D) 2000 UNITS tablet Take 2,000 Units by mouth daily.     gabapentin (NEURONTIN) 600 MG tablet Take 1 tablet (600 mg total) by mouth as directed. Take 1 tablet in the AM and afternoon and 1 1/2 tabs at bedtime (Patient taking differently: Take 600 mg by mouth 3 (three) times daily. Take 1 tablet in the AM and afternoon and 1 1/2 tabs at bedtime) 315 tablet 2   HYDROcodone-acetaminophen (NORCO/VICODIN) 5-325 MG tablet Take 1 tablet by mouth every 6 (six) hours as needed for moderate pain. (Patient not taking: Reported on 04/09/2018) 30 tablet 0   lidocaine-prilocaine (EMLA) cream Apply to affected area once (Patient taking differently: Apply 1 application topically every 30 (thirty) days. Apply to port prior to infusions) 30 g 3   omeprazole (PRILOSEC) 40 MG capsule TAKE 1 CAPSULE BY MOUTH ONCE DAILY FOR 30 DAYS  5   ondansetron (ZOFRAN) 8 MG tablet Take 1 tablet (8 mg total) by mouth every 8 (eight) hours as needed for nausea or vomiting. 20 tablet 0   OVER THE COUNTER MEDICATION Place 1 drop into both eyes 2 (two) times daily as needed (dry eyes). Over the counter lubricating eye drops     polyethylene glycol (MIRALAX / GLYCOLAX) packet Take 17 g by mouth daily as needed.     Probiotic Product (PROBIOTIC DAILY PO) Take 1 capsule by mouth daily.      spironolactone  (ALDACTONE) 25 MG tablet Take 25 mg by mouth daily.     No current facility-administered medications for this visit.     Physical Findings:  Unable to assess due to telephone visit format.   Lab Findings: Lab Results  Component Value Date   WBC 2.5 (L) 08/24/2018   HGB 11.3 (L) 08/24/2018   HCT 35.1 (L) 08/24/2018   MCV 94.6 08/24/2018   PLT 159 08/24/2018     Radiographic Findings: No results found.  Impression/Plan: 44. 73 yo woman with ER positive breast cancer with a solitary brain metastasis and bony metastatses with diease to the spine.  Regarding her metastatic brain disease, she appears to be clinically stable despite mild progression with one enlarging calvarial met at the base of the skull on her last MRI in 07/2018.  There was no residual or progressive parenchymal disease noted in the brain.  As she remains asymptomatic regarding the calvarial lesions and without evidence of new or progressive parenchymal lesions, as per recommendations from recent multidisciplinary brain and spine conference, we will continue to monitor her closely with serial brain MRI scans every 3 months and office follow-up thereafter to review results.  Her next brain MRI will be due in June 2020.  Regarding her systemic disease, she will continue in routine follow-up for disease management under the care and direction of [name].   2. Spine metastases.  She appears to have recovered well from the  effects of her most recent SRS treatment to T12 and L2   She is currently without complaints.  We discussed the recommendation to follow this closely with serial imaging every 3 months and she prefers to have this followed with systemic imaging under the care and direction of [name] and only repeat spine MRI if she becomes symptomatic.  We discussed that CT imaging is not the ideal imaging modailty for evaluation of the spine but certainly understand her concerns for limiting the number of imaging studies  that she is subjected to on a routine basis.  She is a most reliable patient and will let us know if she becomes symptomatic in the spine so that we can more thoroughly evaluate with MRI if/when necessary.  She appears to have a good understanding of her overall disease and our recommendations and is in agreement with the stated plan.  She knows to call at anytime in the interim with any questions or concerns.     Nicholos Johns, PA-C

## 2018-09-13 NOTE — Progress Notes (Signed)
Patient Care Team: [name], MD as PCP - General (Family Medicine)  DIAGNOSIS:    ICD-10-CM   1. Breast cancer metastasized to bone, right (HCC) C50.911 NM PET Image Restag (PS) Skull Base To Thigh   C79.51   2. Primary cancer of upper outer quadrant of right female breast ([place]) C50.411 NM PET Image Restag (PS) Skull Base To Thigh    SUMMARY OF ONCOLOGIC HISTORY:   Primary cancer of upper outer quadrant of right female breast ([place])   03/08/2012 Initial Diagnosis    Right breast invasive ductal carcinoma ER positive PR negative HER-2 positive Ki-67 44%; another breast mass biopsied in the anterior part of the breast which was also positive for malignancy that was HER-2 negative    03/14/2012 Cancer Staging    Staging form: Breast, AJCC 7th Edition - Pathologic: Stage IV (M1) - Signed by [name], NP on 06/22/2018    05/10/2012 Surgery    Right mastectomy and axillary lymph node dissection: Multifocal disease 5 cm, 1.7 cm, 1.6 cm, ER positive PR negative HER-2 positive Ki-67 44%, 3/7 lymph nodes positive    06/14/2012 - 06/12/2013 Chemotherapy    Adjuvant chemotherapy with [place] 6 followed by Herceptin maintenance    10/25/2012 - 12/11/2012 Radiation Therapy    Adjuvant radiation therapy    02/07/2013 -  Anti-estrogen oral therapy    Letrozole 2.5 mg daily    02/14/2015 Imaging    MRI spine: Large destructive T5 lesion with severe pathologic compression fracture and extensive epidural tumor, severe spinal stenosis with moderate cord compression, tumor involvement of T4    03/18/2015 PET scan    Residual enhancing soft tissue adjacent to the spinal cord. No evidence of metastatic disease. Nonspecific uptake the left nipple    03/27/2015 - 03/30/2015 Hospital Admission    T4-6 decompression for spinal cord compression (lower extremity paralysis)    04/10/2015 -  Chemotherapy    Palliative treatment with Herceptin every 3 weeks with letrozole 2.5 mg daily    04/13/2015  - 05/19/2015 Radiation Therapy    Palliative radiation treatment to the spine    09/01/2015 Imaging    CT chest abdomen pelvis: Pathologic fracture with posterior fusion at T5 no other evidence of metastatic disease in the chest abdomen pelvis    12/23/2015 Imaging    CT CAP: new nonspecific 0.6 cm lymph node was stated mediastinum needs follow-up CT, innumerable tiny groundglass pulmonary nodules throughout both lungs unchanged, patchy consolidation from radiation, T5 fracture, no mets    04/15/2016 PET scan    Rt para-spinal mass mildy hypermetabolic SUV 5.9, lytic cortical lesion 4.8 cm lesion left prox femur suv 8.8 favor osseus met disease    04/28/2016 - 05/12/2016 Radiation Therapy    Palliative XRT Left femur    05/24/2016 Procedure    Soft tissue mass biopsy back: Metastatic breast cancer ER 80%, PR 0%, Ki-67 50%, HER-2 positive ratio 2.25    06/09/2016 - 03/29/2017 Anti-estrogen oral therapy    Faslodex with Herceptin and Perjeta every 4 weeks    06/13/2016 - 06/15/2016 Hospital Admission    uncomplicated revision of previous thoracic instrumentation.    10/10/2016 PET scan    Interval development of new hypermetabolic 2.5 x 1.1 cm lesion posterior right eighth rib consistent with metastatic disease, stable right paraspinal lesion at T8, clustered soft tissue nodules in the posterior midline back near cervicothoracic junction smaller than before     11/03/2016 - 11/07/2016 Radiation Therapy  Palliative radiation to right eighth rib    11/04/2016 Imaging    Solitary contrast-enhancing lesion in the right cerebellum 6 x 2 mm concerning for metastatic lesion    11/24/2016 - 11/24/2016 Radiation Therapy    SRS to the brain lesion    04/18/2017 PET scan    New ill-defined rounded airspace opacity in posterior right lower lobe. Differential diagnosis includes malignancy and infectious/inflammatory process.Increased hypermetabolic lytic lesion in T8 vertebral body, consistent with bone  metastasis.  Increased hypermetabolic posterior upper chest wall subcutaneous nodules, highly suspicious metastatic disease. Further improvement in right posterior eighth rib metastasis.    05/04/2017 - 07/27/2017 Chemotherapy    Kadcyla every 3 weeks palliative chemotherapy stopped for progression    07/26/2017 Imaging    Interval increase in size of the multiple enhancing masses posteriorly overlying the spine, interval development of left axillary and mediastinal adenopathy, increase in the size of lytic lesions in T8-T9 and left ninth rib, increased groundglass opacities right lower lobe lung    07/27/2017 - 05/11/2018 Chemotherapy    Palbociclib + Faslodex + trastuzumab + Pertuzumab      04/03/2018 PET scan    PET CT scan showed progression of disease not only in the bones but also in the subcutaneous nodules.    05/15/2018 - 05/25/2018 Radiation Therapy    Palliative radiation to the back    06/01/2018 -  Chemotherapy    Enhertu (Transtuzumab Deruxtecan)     08/07/2018 - 08/07/2018 Radiation Therapy    T12 vertebral body SRS     Breast cancer metastasized to bone, right ([place])   02/14/2015 Initial Diagnosis    Breast cancer metastasized to bone, right ([place])    06/01/2018 -  Chemotherapy    The patient had palonosetron (ALOXI) injection 0.25 mg, 0.25 mg, Intravenous,  Once, 5 of 10 cycles Administration: 0.25 mg (06/01/2018), 0.25 mg (06/22/2018), 0.25 mg (08/24/2018), 0.25 mg (07/13/2018), 0.25 mg (08/03/2018) fam-trastuzumab deruxtecan-nxki (ENHERTU) 332 mg in dextrose 5 % 100 mL chemo infusion, 5.4 mg/kg = 332 mg, Intravenous,  Once, 5 of 10 cycles Administration: 332 mg (06/01/2018), 332 mg (06/22/2018), 332 mg (07/13/2018), 332 mg (08/24/2018), 332 mg (08/03/2018)  for chemotherapy treatment.      CHIEF COMPLIANT: Cycle 6 Enhertu  INTERVAL HISTORY: Heather [name] is a 73 y.o. with above-mentioned history of metastatic breast cancer who is hereforcycle6of chemotherapy with  trastuzumab-Deruxtecan (Enhertu). She presents to the clinic alone today for treatment.   Her biggest concern is hair loss from treatment.  She is also noticed neuropathy in the hands.  REVIEW OF SYSTEMS:   Constitutional: Denies fevers, chills or abnormal weight loss Eyes: Denies blurriness of vision Ears, nose, mouth, throat, and face: Denies mucositis or sore throat Respiratory: Denies cough, dyspnea or wheezes Cardiovascular: Denies palpitation, chest discomfort Gastrointestinal: Denies nausea, heartburn or change in bowel habits Skin: Denies abnormal skin rashes Lymphatics: Denies new lymphadenopathy or easy bruising Neurological: Peripheral neuropathy Behavioral/Psych: Mood is stable, no new changes  Extremities: No lower extremity edema Breast: denies any pain or lumps or nodules in either breasts All other systems were reviewed with the patient and are negative.  I have reviewed the past medical history, past surgical history, social history and family history with the patient and they are unchanged from previous note.  ALLERGIES:  is allergic to other; acyclovir and related; chlorhexidine; zanaflex [tizanidine hcl]; codeine; keflex [cephalexin]; naprosyn [naproxen]; oxycodone; tramadol; and tussin [guaifenesin].  MEDICATIONS:  Current Outpatient Medications  Medication Sig Dispense  Refill  . ALPRAZolam (XANAX) 0.25 MG tablet Take 1 tablet by mouth as needed.    . Calcium Carbonate Antacid (TUMS PO) Take 2 tablets by mouth 2 (two) times daily as needed (acid reflux).    . Cholecalciferol (VITAMIN D) 2000 UNITS tablet Take 2,000 Units by mouth daily.    Marland Kitchen gabapentin (NEURONTIN) 600 MG tablet Take 1 tablet (600 mg total) by mouth as directed. Take 1 tablet in the AM and afternoon and 1 1/2 tabs at bedtime (Patient taking differently: Take 600 mg by mouth 3 (three) times daily. Take 1 tablet in the AM and afternoon and 1 1/2 tabs at bedtime) 315 tablet 2  . HYDROcodone-acetaminophen  (NORCO/VICODIN) 5-325 MG tablet Take 1 tablet by mouth every 6 (six) hours as needed for moderate pain. (Patient not taking: Reported on 04/09/2018) 30 tablet 0  . lidocaine-prilocaine (EMLA) cream Apply to affected area once (Patient taking differently: Apply 1 application topically every 30 (thirty) days. Apply to port prior to infusions) 30 g 3  . omeprazole (PRILOSEC) 40 MG capsule TAKE 1 CAPSULE BY MOUTH ONCE DAILY FOR 30 DAYS  5  . ondansetron (ZOFRAN) 8 MG tablet Take 1 tablet (8 mg total) by mouth every 8 (eight) hours as needed for nausea or vomiting. 20 tablet 0  . OVER THE COUNTER MEDICATION Place 1 drop into both eyes 2 (two) times daily as needed (dry eyes). Over the counter lubricating eye drops    . polyethylene glycol (MIRALAX / GLYCOLAX) packet Take 17 g by mouth daily as needed.    . Probiotic Product (PROBIOTIC DAILY PO) Take 1 capsule by mouth daily.     Marland Kitchen spironolactone (ALDACTONE) 25 MG tablet Take 25 mg by mouth daily.     No current facility-administered medications for this visit.     PHYSICAL EXAMINATION: ECOG PERFORMANCE STATUS: 1 - Symptomatic but completely ambulatory  Vitals:   09/14/18 1038  BP: (!) 145/70  Pulse: 71  Resp: 18  Temp: 99 F (37.2 C)  SpO2: 99%   Filed Weights   09/14/18 1038  Weight: 129 lb 4.8 oz (58.7 kg)    GENERAL: alert, no distress and comfortable SKIN: skin color, texture, turgor are normal, no rashes or significant lesions EYES: normal, Conjunctiva are pink and non-injected, sclera clear OROPHARYNX: no exudate, no erythema and lips, buccal mucosa, and tongue normal  NECK: supple, thyroid normal size, non-tender, without nodularity LYMPH: no palpable lymphadenopathy in the cervical, axillary or inguinal LUNGS: clear to auscultation and percussion with normal breathing effort HEART: regular rate & rhythm and no murmurs and no lower extremity edema ABDOMEN: abdomen soft, non-tender and normal bowel sounds MUSCULOSKELETAL: no  cyanosis of digits and no clubbing  NEURO: alert & oriented x 3 with fluent speech, peripheral neuropathy in the hands EXTREMITIES: No lower extremity edema  LABORATORY DATA:  I have reviewed the data as listed CMP Latest Ref Rng & Units 09/14/2018 08/24/2018 08/03/2018  Glucose 70 - 99 mg/dL 114(H) 113(H) 81  BUN 8 - 23 mg/dL _0 Creatinine 0.44 - 1.00 mg/dL 0.79 0.81 0.79  Sodium 135 - 145 mmol/L 136 137 140  Potassium 3.5 - 5.1 mmol/L 4.0 3.8 4.3  Chloride 98 - 111 mmol/L 102 101 105  CO2 22 - 32 mmol/L _1 Calcium 8.9 - 10.3 mg/dL 9.1 9.3 9.5  Total Protein 6.5 - 8.1 g/dL 7.2 6.9 7.2  Total Bilirubin 0.3 - 1.2 mg/dL 0.5 0.3 0.3  Alkaline  Phos 38 - 126 U/L 83 73 82  AST 15 - 41 U/L 35 27 35  ALT 0 - 44 U/L _0 Lab Results  Component Value Date   WBC 2.5 (L) 09/14/2018   HGB 11.4 (L) 09/14/2018   HCT 36.7 09/14/2018   MCV 99.7 09/14/2018   PLT 124 (L) 09/14/2018   NEUTROABS 1.6 (L) 09/14/2018    ASSESSMENT & PLAN:  Primary cancer of upper outer quadrant of right female breast ([place]) Right breast invasive ductal carcinoma ER positive PR negative HER-2 positive Ki-67 44% multifocal disease 3/7 lymph nodes positive T2 N1 M0 stage IIB status post adjuvant chemotherapy with TCH followed by Herceptin maintenance, adjuvant radiation therapy and Letrozole. MRI 02/14/2015: T5 large destructive lesion with pathologic compression fracture and extensive epidural tumor, involvement of T4, excision metastatic carcinoma ER 60%, PR 0%, HER-2 positive ratio 2.71 average copy #7.45 03/27/2015: T4-6 decompression surgery for spinal cord compression PET-CT 12/10/17Rt para-spinal mass mildy hypermetabolic SUV 5.9, lytic cortical lesion 4.8 cm lesion left prox femur suv 8.8 favor osseus met disease Echo 04/12/2017 shows well preserved ejection fraction of 55-60%  Treatment summary: 1. Resumed anastrozole 05/21/2015, but it has caused significant hair loss so we switched her to  exemestane 25 mg once daily 07/23/2015 2. Herceptin every 3 week started 04/10/2015 3. Bone metastases: Zometa every 3 months-next duelate May/early June 4. Radiation therapy to the spine (Dr. [name]) 04/13/2015 to 05/19/2015 5. Radiation therapy to left femur 04/28/2016 to 05/12/2016  6. SRS to the brain lesion 11/24/2016 7.Kadcyla started 05/05/2017 discontinued 07/27/2017 8.Palbociclib +Faslodex +trastuzumab+ Pertuzumabstarted 08/10/2017, stopped 04/04/2018 9. Enhertu started 06/01/18 -------------------------------------------------------------------------------------------------------------------------- PET CT scan 04/03/2018: Multiple new bone lesions including skull, right L2, T12, left distal femur, right distal femur, right acromium. Posterior upper chest wall lesions increase in size and nodularity.  Current treatment:Cycle6day 1Enhertu (Fam-trastuzumab-Durextecan) Echo 04/09/2018 shows EF of 55-60%.  Toxicities: 1. Dry eyes/irritation/infection: she will continue to follow with ophthalmology. 2.Severe fatigue 3. Pain: controlled right now with Gabapentin. 4.Hair loss  MRI brain and thoracic spine on 07/12/2018:No parenchymal metastases. 3 skull metastases 5 to 14 mm stable to slightly increased since December MRI thoracic spine: Enlarged left paracentral T12 vertebral body metastases 20 mm, posterior subcutaneous meds appear regressed.  XRT to T12 07/19/2018  We will perform PET CT scan before her next treatment. If she is responding to treatment we will continue with the same treatment. We discussed the Tucatinib is now available and that could be her alternate treatment if she decides to stop it.  The cancer is progressing. Return to clinic in 3 weeks for cycle 7    Orders Placed This Encounter  Procedures  . NM PET Image Restag (PS) Skull Base To Thigh    Standing Status:   Future    Standing Expiration Date:   09/14/2019    Order Specific Question:    ** REASON FOR EXAM (FREE TEXT)    Answer:   Met breast cancer restaging on treatment    Order Specific Question:   If indicated for the ordered procedure, I authorize the administration of a radiopharmaceutical per Radiology protocol    Answer:   Yes    Order Specific Question:   Preferred imaging location?    Answer:   [facility]    Order Specific Question:   Radiology Contrast Protocol - do NOT remove file path    Answer:   [email or web address]   The patient has a good understanding  of the overall plan. she agrees with it. she will call with any problems that may develop before the next visit here.  [name], MD 09/14/2018  Julious Oka Dorshimer am acting as scribe for Dr. [name].  I have reviewed the above documentation for accuracy and completeness, and I agree with the above.

## 2018-09-14 ENCOUNTER — Inpatient Hospital Stay: Payer: Medicare Other

## 2018-09-14 ENCOUNTER — Inpatient Hospital Stay: Payer: Medicare Other | Attending: Hematology and Oncology

## 2018-09-14 ENCOUNTER — Other Ambulatory Visit: Payer: Self-pay

## 2018-09-14 ENCOUNTER — Inpatient Hospital Stay (HOSPITAL_BASED_OUTPATIENT_CLINIC_OR_DEPARTMENT_OTHER): Payer: Medicare Other | Admitting: Hematology and Oncology

## 2018-09-14 VITALS — BP 145/70 | HR 71 | Temp 99.0°F | Resp 18 | Ht 64.0 in | Wt 129.3 lb

## 2018-09-14 DIAGNOSIS — Z9221 Personal history of antineoplastic chemotherapy: Secondary | ICD-10-CM | POA: Diagnosis not present

## 2018-09-14 DIAGNOSIS — Z9011 Acquired absence of right breast and nipple: Secondary | ICD-10-CM | POA: Diagnosis not present

## 2018-09-14 DIAGNOSIS — Z79899 Other long term (current) drug therapy: Secondary | ICD-10-CM

## 2018-09-14 DIAGNOSIS — C50911 Malignant neoplasm of unspecified site of right female breast: Secondary | ICD-10-CM

## 2018-09-14 DIAGNOSIS — Z79811 Long term (current) use of aromatase inhibitors: Secondary | ICD-10-CM | POA: Diagnosis not present

## 2018-09-14 DIAGNOSIS — C7951 Secondary malignant neoplasm of bone: Secondary | ICD-10-CM | POA: Insufficient documentation

## 2018-09-14 DIAGNOSIS — Z5112 Encounter for antineoplastic immunotherapy: Secondary | ICD-10-CM | POA: Diagnosis not present

## 2018-09-14 DIAGNOSIS — Z170 Estrogen receptor positive status [ER+]: Secondary | ICD-10-CM

## 2018-09-14 DIAGNOSIS — Z923 Personal history of irradiation: Secondary | ICD-10-CM | POA: Insufficient documentation

## 2018-09-14 DIAGNOSIS — C50411 Malignant neoplasm of upper-outer quadrant of right female breast: Secondary | ICD-10-CM | POA: Diagnosis not present

## 2018-09-14 DIAGNOSIS — G9529 Other cord compression: Secondary | ICD-10-CM

## 2018-09-14 LAB — CBC WITH DIFFERENTIAL (CANCER CENTER ONLY)
Abs Immature Granulocytes: 0 10*3/uL (ref 0.00–0.07)
Basophils Absolute: 0 10*3/uL (ref 0.0–0.1)
Basophils Relative: 2 %
Eosinophils Absolute: 0.1 10*3/uL (ref 0.0–0.5)
Eosinophils Relative: 3 %
HCT: 36.7 % (ref 36.0–46.0)
Hemoglobin: 11.4 g/dL — ABNORMAL LOW (ref 12.0–15.0)
Immature Granulocytes: 0 %
Lymphocytes Relative: 10 %
Lymphs Abs: 0.3 10*3/uL — ABNORMAL LOW (ref 0.7–4.0)
MCH: 31 pg (ref 26.0–34.0)
MCHC: 31.1 g/dL (ref 30.0–36.0)
MCV: 99.7 fL (ref 80.0–100.0)
Monocytes Absolute: 0.5 10*3/uL (ref 0.1–1.0)
Monocytes Relative: 20 %
Neutro Abs: 1.6 10*3/uL — ABNORMAL LOW (ref 1.7–7.7)
Neutrophils Relative %: 65 %
Platelet Count: 124 10*3/uL — ABNORMAL LOW (ref 150–400)
RBC: 3.68 MIL/uL — ABNORMAL LOW (ref 3.87–5.11)
RDW: 18.2 % — ABNORMAL HIGH (ref 11.5–15.5)
WBC Count: 2.5 10*3/uL — ABNORMAL LOW (ref 4.0–10.5)
nRBC: 0 % (ref 0.0–0.2)

## 2018-09-14 LAB — CMP (CANCER CENTER ONLY)
ALT: 20 U/L (ref 0–44)
AST: 35 U/L (ref 15–41)
Albumin: 3.5 g/dL (ref 3.5–5.0)
Alkaline Phosphatase: 83 U/L (ref 38–126)
Anion gap: 9 (ref 5–15)
BUN: 10 mg/dL (ref 8–23)
CO2: 25 mmol/L (ref 22–32)
Calcium: 9.1 mg/dL (ref 8.9–10.3)
Chloride: 102 mmol/L (ref 98–111)
Creatinine: 0.79 mg/dL (ref 0.44–1.00)
GFR, Est AFR Am: >60 mL/min (ref >60)
GFR, Estimated: >60 mL/min (ref >60)
Glucose, Bld: 114 mg/dL — ABNORMAL HIGH (ref 70–99)
Potassium: 4 mmol/L (ref 3.5–5.1)
Sodium: 136 mmol/L (ref 135–145)
Total Bilirubin: 0.5 mg/dL (ref 0.3–1.2)
Total Protein: 7.2 g/dL (ref 6.5–8.1)

## 2018-09-14 MED ORDER — HEPARIN SOD (PORK) LOCK FLUSH 100 UNIT/ML IV SOLN
500.0000 [IU] | Freq: Once | INTRAVENOUS | Status: AC | PRN
  Administered 2018-09-14: 500 [IU]
  Filled 2018-09-14: qty 5

## 2018-09-14 MED ORDER — SODIUM CHLORIDE 0.9% FLUSH
10.0000 mL | INTRAVENOUS | Status: DC | PRN
  Administered 2018-09-14: 10 mL
  Filled 2018-09-14: qty 10

## 2018-09-14 MED ORDER — ACETAMINOPHEN 325 MG PO TABS
650.0000 mg | ORAL_TABLET | Freq: Once | ORAL | Status: AC
  Administered 2018-09-14: 650 mg via ORAL

## 2018-09-14 MED ORDER — CYANOCOBALAMIN 1000 MCG/ML IJ SOLN
1000.0000 ug | Freq: Once | INTRAMUSCULAR | Status: AC
  Administered 2018-09-14: 1000 ug via INTRAMUSCULAR

## 2018-09-14 MED ORDER — DEXAMETHASONE SODIUM PHOSPHATE 10 MG/ML IJ SOLN: 10.0000 mg | Freq: Once | INTRAMUSCULAR | Status: DC

## 2018-09-14 MED ORDER — SODIUM CHLORIDE 0.9% FLUSH
10.0000 mL | Freq: Once | INTRAVENOUS | Status: AC | PRN
  Administered 2018-09-14: 10 mL
  Filled 2018-09-14: qty 10

## 2018-09-14 MED ORDER — PALONOSETRON HCL INJECTION 0.25 MG/5ML
0.2500 mg | Freq: Once | INTRAVENOUS | Status: AC
  Administered 2018-09-14: 0.25 mg via INTRAVENOUS

## 2018-09-14 MED ORDER — FAM-TRASTUZUMAB DERUXTECAN-NXKI CHEMO 100 MG IV SOLR
5.4000 mg/kg | Freq: Once | INTRAVENOUS | Status: AC
  Administered 2018-09-14: 332 mg via INTRAVENOUS
  Filled 2018-09-14: qty 16.6

## 2018-09-14 MED ORDER — DEXTROSE 5 % IV SOLN
Freq: Once | INTRAVENOUS | Status: AC
  Administered 2018-09-14: 12:00:00 via INTRAVENOUS
  Filled 2018-09-14: qty 250

## 2018-09-14 MED ORDER — PALONOSETRON HCL INJECTION 0.25 MG/5ML
INTRAVENOUS | Status: AC
  Filled 2018-09-14: qty 5

## 2018-09-14 MED ORDER — CYANOCOBALAMIN 1000 MCG/ML IJ SOLN
INTRAMUSCULAR | Status: AC
  Filled 2018-09-14: qty 1

## 2018-09-14 MED ORDER — ACETAMINOPHEN 325 MG PO TABS
ORAL_TABLET | ORAL | Status: AC
  Filled 2018-09-14: qty 2

## 2018-09-14 NOTE — Patient Instructions (Signed)
[facility] Discharge Instructions for Patients Receiving Chemotherapy  Today you received the following chemotherapy agents: ENHERTU  To help prevent nausea and vomiting after your treatment, we encourage you to take your nausea medication as directed.   If you develop nausea and vomiting that is not controlled by your nausea medication, call the clinic.   BELOW ARE SYMPTOMS THAT SHOULD BE REPORTED IMMEDIATELY:  *FEVER GREATER THAN 100.5 F  *CHILLS WITH OR WITHOUT FEVER  NAUSEA AND VOMITING THAT IS NOT CONTROLLED WITH YOUR NAUSEA MEDICATION  *UNUSUAL SHORTNESS OF BREATH  *UNUSUAL BRUISING OR BLEEDING  TENDERNESS IN MOUTH AND THROAT WITH OR WITHOUT PRESENCE OF ULCERS  *URINARY PROBLEMS  *BOWEL PROBLEMS  UNUSUAL RASH Items with * indicate a potential emergency and should be followed up as soon as possible.  Feel free to call the clinic should you have any questions or concerns. The clinic phone number is [phone].  Please show the [facility] at check-in to the Emergency Department and triage nurse.

## 2018-09-14 NOTE — Patient Instructions (Signed)

## 2018-09-19 DIAGNOSIS — R3915 Urgency of urination: Secondary | ICD-10-CM | POA: Diagnosis not present

## 2018-09-19 DIAGNOSIS — C7951 Secondary malignant neoplasm of bone: Secondary | ICD-10-CM | POA: Diagnosis not present

## 2018-09-19 DIAGNOSIS — K5900 Constipation, unspecified: Secondary | ICD-10-CM | POA: Diagnosis not present

## 2018-09-19 DIAGNOSIS — N3000 Acute cystitis without hematuria: Secondary | ICD-10-CM | POA: Diagnosis not present

## 2018-09-19 DIAGNOSIS — C50911 Malignant neoplasm of unspecified site of right female breast: Secondary | ICD-10-CM | POA: Diagnosis not present

## 2018-09-21 ENCOUNTER — Other Ambulatory Visit: Payer: Self-pay | Admitting: Radiation Therapy

## 2018-09-21 DIAGNOSIS — C7949 Secondary malignant neoplasm of other parts of nervous system: Secondary | ICD-10-CM

## 2018-09-21 DIAGNOSIS — C7931 Secondary malignant neoplasm of brain: Secondary | ICD-10-CM

## 2018-09-26 ENCOUNTER — Telehealth: Payer: Self-pay | Admitting: Radiation Therapy

## 2018-09-26 DIAGNOSIS — K5900 Constipation, unspecified: Secondary | ICD-10-CM | POA: Diagnosis not present

## 2018-09-26 DIAGNOSIS — K219 Gastro-esophageal reflux disease without esophagitis: Secondary | ICD-10-CM | POA: Diagnosis not present

## 2018-09-26 NOTE — Telephone Encounter (Signed)
Spoke with Ms. Cool about her June Brain MRI and follow-up with Ashlyn. She was thankful for the call and available to attend.    Mont Dutton R.T.(R)(T) Special Procedures Navigator

## 2018-09-28 NOTE — Assessment & Plan Note (Signed)
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 MRI 02/14/2015: T5 large destructive lesion with pathologic compression fracture and extensive epidural tumor, involvement of T4, excision metastatic carcinoma ER 60%, PR 0%, HER-2 positive ratio 2.71 average copy #7.45 03/27/2015: T4-6 decompression surgery for spinal cord compression PET-CT 12/10/17Rt para-spinal mass mildy hypermetabolic SUV 5.9, lytic cortical lesion 4.8 cm lesion left prox femur suv 8.8 favor osseus met disease Echo 04/12/2017 shows well preserved ejection fraction of 55-60%  Treatment summary: 1. Resumed anastrozole 05/21/2015, but it has caused significant hair loss so we switched her to exemestane 25 mg once daily 07/23/2015 2. Herceptin every 3 week started 04/10/2015 3. Bone metastases: Zometa every 3 months-next duelate May/early June 4. Radiation therapy to the spine (Dr. [name]) 04/13/2015 to 05/19/2015 5. Radiation therapy to left femur 04/28/2016 to 05/12/2016  6. SRS to the brain lesion 11/24/2016 7.Kadcyla started 05/05/2017 discontinued 07/27/2017 8.Palbociclib +Faslodex +trastuzumab+ Pertuzumabstarted 08/10/2017, stopped 04/04/2018 9. Enhertu started 06/01/18 -------------------------------------------------------------------------------------------------------------------------- PET CT scan 04/03/2018: Multiple new bone lesions including skull, right L2, T12, left distal femur, right distal femur, right acromium. Posterior upper chest wall lesions increase in size and nodularity.  Current treatment:Cycle7day 1Enhertu (Fam-trastuzumab-Durextecan) Echo 04/09/2018 shows EF of 55-60%.  Toxicities: 1. Dry eyes/irritation/infection: she will continue to follow with  ophthalmology. 2.Severe fatigue 3. Pain: controlled right now with Gabapentin. 4.Hair loss  MRI brain and thoracic spine on 07/12/2018:No parenchymal metastases. 3 skull metastases 5 to 14 mm stable to slightly increased since December MRI thoracic spine: Enlarged left paracentral T12 vertebral body metastases 20 mm, posterior subcutaneous meds appear regressed.  XRT to T123/04/2019  PET CT scan 10/03/2018:

## 2018-10-03 ENCOUNTER — Other Ambulatory Visit: Payer: Self-pay

## 2018-10-03 ENCOUNTER — Ambulatory Visit (HOSPITAL_COMMUNITY)
Admission: RE | Admit: 2018-10-03 | Discharge: 2018-10-03 | Disposition: A | Discharge status: Home / Self Care | Payer: Medicare Other | Source: Ambulatory Visit | Attending: Hematology and Oncology | Admitting: Hematology and Oncology

## 2018-10-03 DIAGNOSIS — C50911 Malignant neoplasm of unspecified site of right female breast: Secondary | ICD-10-CM | POA: Diagnosis not present

## 2018-10-03 DIAGNOSIS — C50411 Malignant neoplasm of upper-outer quadrant of right female breast: Secondary | ICD-10-CM | POA: Insufficient documentation

## 2018-10-03 DIAGNOSIS — C7951 Secondary malignant neoplasm of bone: Secondary | ICD-10-CM | POA: Insufficient documentation

## 2018-10-03 DIAGNOSIS — C50919 Malignant neoplasm of unspecified site of unspecified female breast: Secondary | ICD-10-CM | POA: Diagnosis not present

## 2018-10-03 DIAGNOSIS — I700 Atherosclerosis of aorta: Secondary | ICD-10-CM | POA: Diagnosis not present

## 2018-10-03 LAB — GLUCOSE, CAPILLARY: Glucose-Capillary: 92 mg/dL (ref 70–99)

## 2018-10-03 MED ORDER — FLUDEOXYGLUCOSE F - 18 (FDG) INJECTION
6.7800 | Freq: Once | INTRAVENOUS | Status: AC | PRN
  Administered 2018-10-03: 6.78 via INTRAVENOUS

## 2018-10-04 NOTE — Progress Notes (Signed)
Patient Care Team: [name], MD as PCP - General (Family Medicine)  DIAGNOSIS:    ICD-10-CM   1. Primary cancer of upper outer quadrant of right female breast ([place]) C50.411     SUMMARY OF ONCOLOGIC HISTORY:   Primary cancer of upper outer quadrant of right female breast ([place])   03/08/2012 Initial Diagnosis    Right breast invasive ductal carcinoma ER positive PR negative HER-2 positive Ki-67 44%; another breast mass biopsied in the anterior part of the breast which was also positive for malignancy that was HER-2 negative    03/14/2012 Cancer Staging    Staging form: Breast, AJCC 7th Edition - Pathologic: Stage IV (M1) - Signed by [name], NP on 06/22/2018    05/10/2012 Surgery    Right mastectomy and axillary lymph node dissection: Multifocal disease 5 cm, 1.7 cm, 1.6 cm, ER positive PR negative HER-2 positive Ki-67 44%, 3/7 lymph nodes positive    06/14/2012 - 06/12/2013 Chemotherapy    Adjuvant chemotherapy with [place] 6 followed by Herceptin maintenance    10/25/2012 - 12/11/2012 Radiation Therapy    Adjuvant radiation therapy    02/07/2013 -  Anti-estrogen oral therapy    Letrozole 2.5 mg daily    02/14/2015 Imaging    MRI spine: Large destructive T5 lesion with severe pathologic compression fracture and extensive epidural tumor, severe spinal stenosis with moderate cord compression, tumor involvement of T4    03/18/2015 PET scan    Residual enhancing soft tissue adjacent to the spinal cord. No evidence of metastatic disease. Nonspecific uptake the left nipple    03/27/2015 - 03/30/2015 Hospital Admission    T4-6 decompression for spinal cord compression (lower extremity paralysis)    04/10/2015 -  Chemotherapy    Palliative treatment with Herceptin every 3 weeks with letrozole 2.5 mg daily    04/13/2015 - 05/19/2015 Radiation Therapy    Palliative radiation treatment to the spine    09/01/2015 Imaging    CT chest abdomen pelvis: Pathologic fracture with  posterior fusion at T5 no other evidence of metastatic disease in the chest abdomen pelvis    12/23/2015 Imaging    CT CAP: new nonspecific 0.6 cm lymph node was stated mediastinum needs follow-up CT, innumerable tiny groundglass pulmonary nodules throughout both lungs unchanged, patchy consolidation from radiation, T5 fracture, no mets    04/15/2016 PET scan    Rt para-spinal mass mildy hypermetabolic SUV 5.9, lytic cortical lesion 4.8 cm lesion left prox femur suv 8.8 favor osseus met disease    04/28/2016 - 05/12/2016 Radiation Therapy    Palliative XRT Left femur    05/24/2016 Procedure    Soft tissue mass biopsy back: Metastatic breast cancer ER 80%, PR 0%, Ki-67 50%, HER-2 positive ratio 2.25    06/09/2016 - 03/29/2017 Anti-estrogen oral therapy    Faslodex with Herceptin and Perjeta every 4 weeks    06/13/2016 - 06/15/2016 Hospital Admission    uncomplicated revision of previous thoracic instrumentation.    10/10/2016 PET scan    Interval development of new hypermetabolic 2.5 x 1.1 cm lesion posterior right eighth rib consistent with metastatic disease, stable right paraspinal lesion at T8, clustered soft tissue nodules in the posterior midline back near cervicothoracic junction smaller than before     11/03/2016 - 11/07/2016 Radiation Therapy    Palliative radiation to right eighth rib    11/04/2016 Imaging    Solitary contrast-enhancing lesion in the right cerebellum 6 x 2 mm concerning for metastatic lesion  11/24/2016 - 11/24/2016 Radiation Therapy    SRS to the brain lesion    04/18/2017 PET scan    New ill-defined rounded airspace opacity in posterior right lower lobe. Differential diagnosis includes malignancy and infectious/inflammatory process.Increased hypermetabolic lytic lesion in T8 vertebral body, consistent with bone metastasis.  Increased hypermetabolic posterior upper chest wall subcutaneous nodules, highly suspicious metastatic disease. Further improvement in right  posterior eighth rib metastasis.    05/04/2017 - 07/27/2017 Chemotherapy    Kadcyla every 3 weeks palliative chemotherapy stopped for progression    07/26/2017 Imaging    Interval increase in size of the multiple enhancing masses posteriorly overlying the spine, interval development of left axillary and mediastinal adenopathy, increase in the size of lytic lesions in T8-T9 and left ninth rib, increased groundglass opacities right lower lobe lung    07/27/2017 - 05/11/2018 Chemotherapy    Palbociclib + Faslodex + trastuzumab + Pertuzumab      04/03/2018 PET scan    PET CT scan showed progression of disease not only in the bones but also in the subcutaneous nodules.    05/15/2018 - 05/25/2018 Radiation Therapy    Palliative radiation to the back    06/01/2018 -  Chemotherapy    Enhertu (Transtuzumab Deruxtecan)     08/07/2018 - 08/07/2018 Radiation Therapy    T12 vertebral body SRS     Breast cancer metastasized to bone, right ([place])   02/14/2015 Initial Diagnosis    Breast cancer metastasized to bone, right ([place])    06/01/2018 -  Chemotherapy    The patient had palonosetron (ALOXI) injection 0.25 mg, 0.25 mg, Intravenous,  Once, 6 of 10 cycles Administration: 0.25 mg (06/01/2018), 0.25 mg (06/22/2018), 0.25 mg (08/24/2018), 0.25 mg (07/13/2018), 0.25 mg (08/03/2018), 0.25 mg (09/14/2018) fam-trastuzumab deruxtecan-nxki (ENHERTU) 332 mg in dextrose 5 % 100 mL chemo infusion, 5.4 mg/kg = 332 mg, Intravenous,  Once, 6 of 10 cycles Administration: 332 mg (06/01/2018), 332 mg (06/22/2018), 332 mg (07/13/2018), 332 mg (08/24/2018), 332 mg (08/03/2018), 332 mg (09/14/2018)  for chemotherapy treatment.      CHIEF COMPLIANT: Cycle 7 Enhertu  INTERVAL HISTORY: Heather [name] is a 73 y.o. with above-mentioned history of metastatic breast cancer currently on chemotherapy with trastuzumab-Deruxtecan (Enhertu). PET scan on 10/03/18 showed no new bone lesions and the 2 previously hypermetabolic bony lesions showed mild  increase in size but are considerably less metabolically active favoring improvement. The subcutaneous lesions on the right upper back and left pubis have resolved. She presents to the clinicalone today for cycle 7 and to review her PET scan.    REVIEW OF SYSTEMS:   Constitutional: Denies fevers, chills or abnormal weight loss Eyes: Denies blurriness of vision Ears, nose, mouth, throat, and face: Denies mucositis or sore throat Respiratory: Denies cough, dyspnea or wheezes Cardiovascular: Denies palpitation, chest discomfort Gastrointestinal: Denies nausea, heartburn or change in bowel habits Skin: Denies abnormal skin rashes Lymphatics: Denies new lymphadenopathy or easy bruising Neurological: Denies numbness, tingling or new weaknesses Behavioral/Psych: Mood is stable, no new changes  Extremities: No lower extremity edema Breast: denies any pain or lumps or nodules in either breasts All other systems were reviewed with the patient and are negative.  I have reviewed the past medical history, past surgical history, social history and family history with the patient and they are unchanged from previous note.  ALLERGIES:  is allergic to other; acyclovir and related; chlorhexidine; zanaflex [tizanidine hcl]; codeine; keflex [cephalexin]; naprosyn [naproxen]; oxycodone; tramadol; and tussin [guaifenesin].  MEDICATIONS:  Current Outpatient Medications  Medication Sig Dispense Refill  . ALPRAZolam (XANAX) 0.25 MG tablet Take 1 tablet by mouth as needed.    . Calcium Carbonate Antacid (TUMS PO) Take 2 tablets by mouth 2 (two) times daily as needed (acid reflux).    . Cholecalciferol (VITAMIN D) 2000 UNITS tablet Take 2,000 Units by mouth daily.    Marland Kitchen gabapentin (NEURONTIN) 600 MG tablet Take 1 tablet (600 mg total) by mouth as directed. Take 1 tablet in the AM and afternoon and 1 1/2 tabs at bedtime (Patient taking differently: Take 600 mg by mouth 3 (three) times daily. Take 1 tablet in the AM  and afternoon and 1 1/2 tabs at bedtime) 315 tablet 2  . HYDROcodone-acetaminophen (NORCO/VICODIN) 5-325 MG tablet Take 1 tablet by mouth every 6 (six) hours as needed for moderate pain. (Patient not taking: Reported on 04/09/2018) 30 tablet 0  . lidocaine-prilocaine (EMLA) cream Apply to affected area once (Patient taking differently: Apply 1 application topically every 30 (thirty) days. Apply to port prior to infusions) 30 g 3  . omeprazole (PRILOSEC) 40 MG capsule TAKE 1 CAPSULE BY MOUTH ONCE DAILY FOR 30 DAYS  5  . ondansetron (ZOFRAN) 8 MG tablet Take 1 tablet (8 mg total) by mouth every 8 (eight) hours as needed for nausea or vomiting. 20 tablet 0  . OVER THE COUNTER MEDICATION Place 1 drop into both eyes 2 (two) times daily as needed (dry eyes). Over the counter lubricating eye drops    . polyethylene glycol (MIRALAX / GLYCOLAX) packet Take 17 g by mouth daily as needed.    . Probiotic Product (PROBIOTIC DAILY PO) Take 1 capsule by mouth daily.     Marland Kitchen spironolactone (ALDACTONE) 25 MG tablet Take 25 mg by mouth daily.     No current facility-administered medications for this visit.     PHYSICAL EXAMINATION: ECOG PERFORMANCE STATUS: 1 - Symptomatic but completely ambulatory  Vitals:   10/05/18 0957  BP: (!) 150/64  Pulse: 81  Resp: 17  Temp: 98.5 F (36.9 C)  SpO2: 100%   Filed Weights   10/05/18 0957  Weight: 126 lb 4.8 oz (57.3 kg)    GENERAL: alert, no distress and comfortable SKIN: skin color, texture, turgor are normal, no rashes or significant lesions EYES: normal, Conjunctiva are pink and non-injected, sclera clear OROPHARYNX: no exudate, no erythema and lips, buccal mucosa, and tongue normal  NECK: supple, thyroid normal size, non-tender, without nodularity LYMPH: no palpable lymphadenopathy in the cervical, axillary or inguinal LUNGS: clear to auscultation and percussion with normal breathing effort HEART: regular rate & rhythm and no murmurs and no lower extremity  edema ABDOMEN: abdomen soft, non-tender and normal bowel sounds MUSCULOSKELETAL: no cyanosis of digits and no clubbing  NEURO: alert & oriented x 3 with fluent speech, no focal motor/sensory deficits EXTREMITIES: No lower extremity edema  LABORATORY DATA:  I have reviewed the data as listed CMP Latest Ref Rng & Units 09/14/2018 08/24/2018 08/03/2018  Glucose 70 - 99 mg/dL 114(H) 113(H) 81  BUN 8 - 23 mg/dL '10 11 9  '$ Creatinine 0.44 - 1.00 mg/dL 0.79 0.81 0.79  Sodium 135 - 145 mmol/L 136 137 140  Potassium 3.5 - 5.1 mmol/L 4.0 3.8 4.3  Chloride 98 - 111 mmol/L 102 101 105  CO2 22 - 32 mmol/L '25 26 27  '$ Calcium 8.9 - 10.3 mg/dL 9.1 9.3 9.5  Total Protein 6.5 - 8.1 g/dL 7.2 6.9 7.2  Total Bilirubin 0.3 - 1.2  mg/dL 0.5 0.3 0.3  Alkaline Phos 38 - 126 U/L 83 73 82  AST 15 - 41 U/L 35 27 35  ALT 0 - 44 U/L '20 14 25    '$ Lab Results  Component Value Date   WBC 2.1 (L) 10/05/2018   HGB 10.8 (L) 10/05/2018   HCT 33.9 (L) 10/05/2018   MCV 100.0 10/05/2018   PLT 102 (L) 10/05/2018   NEUTROABS 1.5 (L) 10/05/2018    ASSESSMENT & PLAN:  Primary cancer of upper outer quadrant of right female breast ([facility]) Right breast invasive ductal carcinoma ER positive PR negative HER-2 positive Ki-67 44% multifocal disease 3/7 lymph nodes positive T2 N1 M0 stage IIB status post adjuvant chemotherapy with TCH followed by Herceptin maintenance, adjuvant radiation therapy and Letrozole. MRI 02/14/2015: T5 large destructive lesion with pathologic compression fracture and extensive epidural tumor, involvement of T4, excision metastatic carcinoma ER 60%, PR 0%, HER-2 positive ratio 2.71 average copy #7.45 03/27/2015: T4-6 decompression surgery for spinal cord compression PET-CT 12/10/17Rt para-spinal mass mildy hypermetabolic SUV 5.9, lytic cortical lesion 4.8 cm lesion left prox femur suv 8.8 favor osseus met disease Echo 04/12/2017 shows well preserved ejection fraction of 55-60%  Treatment summary: 1. Resumed  anastrozole 05/21/2015, but it has caused significant hair loss so we switched her to exemestane 25 mg once daily 07/23/2015 2. Herceptin every 3 week started 04/10/2015 3. Bone metastases: Zometa every 3 months-next duelate May/early June 4. Radiation therapy to the spine (Dr. [name]) 04/13/2015 to 05/19/2015 5. Radiation therapy to left femur 04/28/2016 to 05/12/2016  6. SRS to the brain lesion 11/24/2016 7.Kadcyla started 05/05/2017 discontinued 07/27/2017 8.Palbociclib +Faslodex +trastuzumab+ Pertuzumabstarted 08/10/2017, stopped 04/04/2018 9. Enhertu started 06/01/18 -------------------------------------------------------------------------------------------------------------------------- PET CT scan 04/03/2018: Multiple new bone lesions including skull, right L2, T12, left distal femur, right distal femur, right acromium. Posterior upper chest wall lesions increase in size and nodularity.  Current treatment:Cycle7day 1Enhertu (Fam-trastuzumab-Durextecan) Echo 04/09/2018 shows EF of 55-60%.  Toxicities: 1. Dry eyes/irritation/infection: she will continue to follow with ophthalmology. 2.Severe fatigue 3. Pain: controlled right now with Gabapentin. 4.Hair loss 5.  Nausea vomiting and constipation: I will reduce the dosage of treatment today.  I sent a prescription for Compazine.  If this does not help then we can give sublingual Zofran.  She tells me that oral Zofran is not working because she is throwing it up.  And also she is worried about constipation side effect of Zofran.  MRI brain and thoracic spine on 07/12/2018:No parenchymal metastases. 3 skull metastases 5 to 14 mm stable to slightly increased since December MRI thoracic spine: Enlarged left paracentral T12 vertebral body metastases 20 mm, posterior subcutaneous meds appear regressed.  XRT to T123/04/2019  PET CT scan 10/03/2018: T12 and L1 vertebral body lesions are not hypermetabolic, resolution of  subcutaneous nodules in the upper back as well as the pubic area and resolution of right adrenal hypermetabolic mass.  Based on these findings patient is responding to treatment and I recommended continuation of treatment.  We will reduce the dose because of toxicities.    No orders of the defined types were placed in this encounter.  The patient has a good understanding of the overall plan. she agrees with it. she will call with any problems that may develop before the next visit here.  [name], MD 10/05/2018  Julious Oka Dorshimer am acting as scribe for Dr. [name].  I have reviewed the above documentation for accuracy and completeness, and I agree with the above.

## 2018-10-05 ENCOUNTER — Inpatient Hospital Stay: Payer: Medicare Other

## 2018-10-05 ENCOUNTER — Inpatient Hospital Stay (HOSPITAL_BASED_OUTPATIENT_CLINIC_OR_DEPARTMENT_OTHER): Payer: Medicare Other | Admitting: Hematology and Oncology

## 2018-10-05 ENCOUNTER — Other Ambulatory Visit: Payer: Self-pay

## 2018-10-05 DIAGNOSIS — C7951 Secondary malignant neoplasm of bone: Secondary | ICD-10-CM

## 2018-10-05 DIAGNOSIS — Z9221 Personal history of antineoplastic chemotherapy: Secondary | ICD-10-CM | POA: Diagnosis not present

## 2018-10-05 DIAGNOSIS — C50911 Malignant neoplasm of unspecified site of right female breast: Secondary | ICD-10-CM

## 2018-10-05 DIAGNOSIS — C50411 Malignant neoplasm of upper-outer quadrant of right female breast: Secondary | ICD-10-CM | POA: Diagnosis not present

## 2018-10-05 DIAGNOSIS — Z170 Estrogen receptor positive status [ER+]: Secondary | ICD-10-CM | POA: Diagnosis not present

## 2018-10-05 DIAGNOSIS — Z79899 Other long term (current) drug therapy: Secondary | ICD-10-CM | POA: Diagnosis not present

## 2018-10-05 DIAGNOSIS — Z5112 Encounter for antineoplastic immunotherapy: Secondary | ICD-10-CM | POA: Diagnosis not present

## 2018-10-05 DIAGNOSIS — Z923 Personal history of irradiation: Secondary | ICD-10-CM

## 2018-10-05 DIAGNOSIS — Z79811 Long term (current) use of aromatase inhibitors: Secondary | ICD-10-CM | POA: Diagnosis not present

## 2018-10-05 DIAGNOSIS — G9529 Other cord compression: Secondary | ICD-10-CM

## 2018-10-05 DIAGNOSIS — Z9011 Acquired absence of right breast and nipple: Secondary | ICD-10-CM

## 2018-10-05 LAB — CMP (CANCER CENTER ONLY)
ALT: 18 U/L (ref 0–44)
AST: 29 U/L (ref 15–41)
Albumin: 3.4 g/dL — ABNORMAL LOW (ref 3.5–5.0)
Alkaline Phosphatase: 79 U/L (ref 38–126)
Anion gap: 9 (ref 5–15)
BUN: 10 mg/dL (ref 8–23)
CO2: 25 mmol/L (ref 22–32)
Calcium: 9.6 mg/dL (ref 8.9–10.3)
Chloride: 104 mmol/L (ref 98–111)
Creatinine: 0.8 mg/dL (ref 0.44–1.00)
GFR, Est AFR Am: >60 mL/min (ref >60)
GFR, Estimated: >60 mL/min (ref >60)
Glucose, Bld: 150 mg/dL — ABNORMAL HIGH (ref 70–99)
Potassium: 3.7 mmol/L (ref 3.5–5.1)
Sodium: 138 mmol/L (ref 135–145)
Total Bilirubin: 0.5 mg/dL (ref 0.3–1.2)
Total Protein: 6.6 g/dL (ref 6.5–8.1)

## 2018-10-05 LAB — CBC WITH DIFFERENTIAL (CANCER CENTER ONLY)
Abs Immature Granulocytes: 0 10*3/uL (ref 0.00–0.07)
Basophils Absolute: 0 10*3/uL (ref 0.0–0.1)
Basophils Relative: 1 %
Eosinophils Absolute: 0.1 10*3/uL (ref 0.0–0.5)
Eosinophils Relative: 3 %
HCT: 33.9 % — ABNORMAL LOW (ref 36.0–46.0)
Hemoglobin: 10.8 g/dL — ABNORMAL LOW (ref 12.0–15.0)
Immature Granulocytes: 0 %
Lymphocytes Relative: 11 %
Lymphs Abs: 0.2 10*3/uL — ABNORMAL LOW (ref 0.7–4.0)
MCH: 31.9 pg (ref 26.0–34.0)
MCHC: 31.9 g/dL (ref 30.0–36.0)
MCV: 100 fL (ref 80.0–100.0)
Monocytes Absolute: 0.2 10*3/uL (ref 0.1–1.0)
Monocytes Relative: 12 %
Neutro Abs: 1.5 10*3/uL — ABNORMAL LOW (ref 1.7–7.7)
Neutrophils Relative %: 73 %
Platelet Count: 102 10*3/uL — ABNORMAL LOW (ref 150–400)
RBC: 3.39 MIL/uL — ABNORMAL LOW (ref 3.87–5.11)
RDW: 17.4 % — ABNORMAL HIGH (ref 11.5–15.5)
WBC Count: 2.1 10*3/uL — ABNORMAL LOW (ref 4.0–10.5)
nRBC: 0 % (ref 0.0–0.2)

## 2018-10-05 MED ORDER — CYANOCOBALAMIN 1000 MCG/ML IJ SOLN
INTRAMUSCULAR | Status: AC
  Filled 2018-10-05: qty 1

## 2018-10-05 MED ORDER — PROCHLORPERAZINE MALEATE 10 MG PO TABS: 10.0000 mg | ORAL_TABLET | Freq: Four times a day (QID) | ORAL | 3 refills | Status: DC | PRN

## 2018-10-05 MED ORDER — SODIUM CHLORIDE 0.9% FLUSH
10.0000 mL | INTRAVENOUS | Status: DC | PRN
  Administered 2018-10-05: 10 mL
  Filled 2018-10-05: qty 10

## 2018-10-05 MED ORDER — SODIUM CHLORIDE 0.9% FLUSH
10.0000 mL | Freq: Once | INTRAVENOUS | Status: AC | PRN
  Administered 2018-10-05: 10 mL
  Filled 2018-10-05: qty 10

## 2018-10-05 MED ORDER — DEXTROSE 5 % IV SOLN
Freq: Once | INTRAVENOUS | Status: AC
  Administered 2018-10-05: 11:00:00 via INTRAVENOUS
  Filled 2018-10-05: qty 250

## 2018-10-05 MED ORDER — DEXAMETHASONE SODIUM PHOSPHATE 10 MG/ML IJ SOLN
INTRAMUSCULAR | Status: AC
  Filled 2018-10-05: qty 1

## 2018-10-05 MED ORDER — ACETAMINOPHEN 325 MG PO TABS
ORAL_TABLET | ORAL | Status: AC
  Filled 2018-10-05: qty 2

## 2018-10-05 MED ORDER — HEPARIN SOD (PORK) LOCK FLUSH 100 UNIT/ML IV SOLN
500.0000 [IU] | Freq: Once | INTRAVENOUS | Status: AC | PRN
  Administered 2018-10-05: 500 [IU]
  Filled 2018-10-05: qty 5

## 2018-10-05 MED ORDER — DEXAMETHASONE SODIUM PHOSPHATE 10 MG/ML IJ SOLN
10.0000 mg | Freq: Once | INTRAMUSCULAR | Status: AC
  Administered 2018-10-05: 10 mg via INTRAVENOUS

## 2018-10-05 MED ORDER — CYANOCOBALAMIN 1000 MCG/ML IJ SOLN
1000.0000 ug | Freq: Once | INTRAMUSCULAR | Status: AC
  Administered 2018-10-05: 1000 ug via INTRAMUSCULAR

## 2018-10-05 MED ORDER — FAM-TRASTUZUMAB DERUXTECAN-NXKI CHEMO 100 MG IV SOLR
4.4000 mg/kg | Freq: Once | INTRAVENOUS | Status: AC
  Administered 2018-10-05: 272 mg via INTRAVENOUS
  Filled 2018-10-05: qty 13.6

## 2018-10-05 MED ORDER — ACETAMINOPHEN 325 MG PO TABS
650.0000 mg | ORAL_TABLET | Freq: Once | ORAL | Status: AC
  Administered 2018-10-05: 650 mg via ORAL

## 2018-10-05 MED ORDER — PALONOSETRON HCL INJECTION 0.25 MG/5ML
0.2500 mg | Freq: Once | INTRAVENOUS | Status: AC
  Administered 2018-10-05: 0.25 mg via INTRAVENOUS

## 2018-10-05 MED ORDER — PALONOSETRON HCL INJECTION 0.25 MG/5ML
INTRAVENOUS | Status: AC
  Filled 2018-10-05: qty 5

## 2018-10-05 NOTE — Patient Instructions (Signed)
Coronavirus (COVID-19) Are you at risk?  Are you at risk for the Coronavirus (COVID-19)?  To be considered HIGH RISK for Coronavirus (COVID-19), you have to meet the following criteria:  . Traveled to [place], [place], [place], [place] or [place]; or in the [place] to [place], [place], [place], or [place]; and have fever, cough, and shortness of breath within the last 2 weeks of travel OR . Been in close contact with a person diagnosed with COVID-19 within the last 2 weeks and have fever, cough, and shortness of breath . IF YOU DO NOT MEET THESE CRITERIA, YOU ARE CONSIDERED LOW RISK FOR COVID-19.  What to do if you are HIGH RISK for COVID-19?  Marland Kitchen If you are having a medical emergency, call 911. . Seek medical care right away. Before you go to a doctor's office, urgent care or emergency department, call ahead and tell them about your recent travel, contact with someone diagnosed with COVID-19, and your symptoms. You should receive instructions from your physician's office regarding next steps of care.  . When you arrive at healthcare provider, tell the healthcare staff immediately you have returned from visiting [place], [place], [place], [place] or [place]; or traveled in the [place] to [place], [place], [place], or [place]; in the last two weeks or you have been in close contact with a person diagnosed with COVID-19 in the last 2 weeks.   . Tell the health care staff about your symptoms: fever, cough and shortness of breath. . After you have been seen by a medical provider, you will be either: o Tested for (COVID-19) and discharged home on quarantine except to seek medical care if symptoms worsen, and asked to  - Stay home and avoid contact with others until you get your results (4-5 days)  - Avoid travel on public transportation if possible (such as bus, train, or airplane) or o Sent to the Emergency Department by EMS for evaluation, COVID-19 testing, and possible  admission depending on your condition and test results.  What to do if you are LOW RISK for COVID-19?  Reduce your risk of any infection by using the same precautions used for avoiding the common cold or flu:  Marland Kitchen Wash your hands often with soap and warm water for at least 20 seconds.  If soap and water are not readily available, use an alcohol-based hand sanitizer with at least 60% alcohol.  . If coughing or sneezing, cover your mouth and nose by coughing or sneezing into the elbow areas of your shirt or coat, into a tissue or into your sleeve (not your hands). . Avoid shaking hands with others and consider head nods or verbal greetings only. . Avoid touching your eyes, nose, or mouth with unwashed hands.  . Avoid close contact with people who are sick. . Avoid places or events with large numbers of people in one location, like concerts or sporting events. . Carefully consider travel plans you have or are making. . If you are planning any travel outside or inside the [place], visit the CDC's Travelers' Health webpage for the latest health notices. . If you have some symptoms but not all symptoms, continue to monitor at home and seek medical attention if your symptoms worsen. . If you are having a medical emergency, call 911.   [place] / e-Visit: [email or web address]         [facility]: [place]  [facility]: [facility]: [place] Discharge Instructions for Patients Receiving Chemotherapy  Today you received the following chemotherapy agents Enhertu  To help prevent nausea and vomiting after your treatment, we encourage you to take your nausea medication as directed.    If you develop nausea and vomiting that is not controlled by your nausea medication, call the clinic.   BELOW ARE  SYMPTOMS THAT SHOULD BE REPORTED IMMEDIATELY:  *FEVER GREATER THAN 100.5 F  *CHILLS WITH OR WITHOUT FEVER  NAUSEA AND VOMITING THAT IS NOT CONTROLLED WITH YOUR NAUSEA MEDICATION  *UNUSUAL SHORTNESS OF BREATH  *UNUSUAL BRUISING OR BLEEDING  TENDERNESS IN MOUTH AND THROAT WITH OR WITHOUT PRESENCE OF ULCERS  *URINARY PROBLEMS  *BOWEL PROBLEMS  UNUSUAL RASH Items with * indicate a potential emergency and should be followed up as soon as possible.  Feel free to call the clinic should you have any questions or concerns. The clinic phone number is [phone].  Please show the [facility] at check-in to the Emergency Department and triage nurse.

## 2018-10-08 ENCOUNTER — Telehealth: Payer: Self-pay | Admitting: Hematology and Oncology

## 2018-10-08 NOTE — Telephone Encounter (Signed)
Called regarding schedule °

## 2018-10-08 NOTE — Op Note (Signed)
   Name: GEOVANA GEBEL  MRN: [id number]  Date: 08/07/2018   DOB: Jan 31, 1946  Stereotactic Radiosurgery Operative Note  PRE-OPERATIVE DIAGNOSIS:  Spinal Metastasis  POST-OPERATIVE DIAGNOSIS:  Spinal Metastasis  PROCEDURE:  Stereotactic Radiosurgery  SURGEON:  Jairo Ben, MD  RADIATION ONCOLOGIST: Dr. Tyler Pita, MD  NARRATIVE: The patient underwent a radiation treatment planning session in the radiation oncology simulation suite under the care of the radiation oncology physician and physicist.  I participated closely in the radiation treatment planning afterwards. The patient underwent planning CT myelogram which was fused to the MRI.  These images were fused on the planning system.  Radiation oncology contoured the gross target volume and subsequently expanded this to yield the Planning Target Volume. I actively participated in the planning process.  I helped to define and review the target contours and also the contours of the spinal cord, and selected nearby organs at risk.  All the dose constraints for critical structures were reviewed and compared to AAPM Task Group 101.  The prescription dose conformity was reviewed.  I approved the plan electronically.    Accordingly, [name] was brought to the TrueBeam stereotactic radiation treatment linac and placed in the custom immobilization device.  The patient was aligned according to the IR fiducial markers with BrainLab Exactrac, then orthogonal x-rays were used in ExacTrac with the 6DOF robotic table and the shifts were made to align the patient.  Then conebeam CT was performed to verify precision.  [name] received stereotactic radiosurgery uneventfully to the T12 vertebral lesion.  The detailed description of the procedure is recorded in the radiation oncology procedure note.  I was present for the duration of the procedure.  DISPOSITION:  Following delivery, the patient was transported to nursing in stable condition  and monitored for possible acute effects to be discharged to home in stable condition with follow-up in one month.  Jairo Ben, MD 10/08/2018 12:28 PM

## 2018-10-08 NOTE — Addendum Note (Signed)
Encounter addended by: Consuella Lose, MD on: 10/08/2018 12:29 PM  Actions taken: Clinical Note Signed

## 2018-10-09 ENCOUNTER — Other Ambulatory Visit: Payer: Self-pay | Admitting: Radiation Therapy

## 2018-10-13 ENCOUNTER — Other Ambulatory Visit: Payer: Self-pay

## 2018-10-13 ENCOUNTER — Ambulatory Visit
Admission: RE | Admit: 2018-10-13 | Discharge: 2018-10-13 | Disposition: A | Discharge status: Home / Self Care | Payer: Medicare Other | Source: Ambulatory Visit | Attending: Radiation Oncology | Admitting: Radiation Oncology

## 2018-10-13 DIAGNOSIS — C7931 Secondary malignant neoplasm of brain: Secondary | ICD-10-CM | POA: Diagnosis not present

## 2018-10-13 DIAGNOSIS — C7949 Secondary malignant neoplasm of other parts of nervous system: Secondary | ICD-10-CM

## 2018-10-13 DIAGNOSIS — C7951 Secondary malignant neoplasm of bone: Secondary | ICD-10-CM | POA: Diagnosis not present

## 2018-10-13 DIAGNOSIS — C50919 Malignant neoplasm of unspecified site of unspecified female breast: Secondary | ICD-10-CM | POA: Diagnosis not present

## 2018-10-13 MED ORDER — GADOBENATE DIMEGLUMINE 529 MG/ML IV SOLN
10.0000 mL | Freq: Once | INTRAVENOUS | Status: AC | PRN
  Administered 2018-10-13: 10 mL via INTRAVENOUS

## 2018-10-15 ENCOUNTER — Inpatient Hospital Stay: Payer: Medicare Other | Attending: Hematology and Oncology

## 2018-10-15 DIAGNOSIS — Z79811 Long term (current) use of aromatase inhibitors: Secondary | ICD-10-CM | POA: Insufficient documentation

## 2018-10-15 DIAGNOSIS — R6889 Other general symptoms and signs: Secondary | ICD-10-CM | POA: Insufficient documentation

## 2018-10-15 DIAGNOSIS — Z5112 Encounter for antineoplastic immunotherapy: Secondary | ICD-10-CM | POA: Insufficient documentation

## 2018-10-15 DIAGNOSIS — C7951 Secondary malignant neoplasm of bone: Secondary | ICD-10-CM | POA: Insufficient documentation

## 2018-10-15 DIAGNOSIS — C50411 Malignant neoplasm of upper-outer quadrant of right female breast: Secondary | ICD-10-CM | POA: Insufficient documentation

## 2018-10-15 DIAGNOSIS — D6959 Other secondary thrombocytopenia: Secondary | ICD-10-CM | POA: Insufficient documentation

## 2018-10-15 DIAGNOSIS — C792 Secondary malignant neoplasm of skin: Secondary | ICD-10-CM | POA: Insufficient documentation

## 2018-10-15 DIAGNOSIS — Z9221 Personal history of antineoplastic chemotherapy: Secondary | ICD-10-CM | POA: Insufficient documentation

## 2018-10-15 DIAGNOSIS — C7931 Secondary malignant neoplasm of brain: Secondary | ICD-10-CM | POA: Insufficient documentation

## 2018-10-15 DIAGNOSIS — Z170 Estrogen receptor positive status [ER+]: Secondary | ICD-10-CM | POA: Insufficient documentation

## 2018-10-15 DIAGNOSIS — Z923 Personal history of irradiation: Secondary | ICD-10-CM | POA: Insufficient documentation

## 2018-10-17 ENCOUNTER — Other Ambulatory Visit: Payer: Self-pay

## 2018-10-17 ENCOUNTER — Ambulatory Visit
Admission: RE | Admit: 2018-10-17 | Discharge: 2018-10-17 | Disposition: A | Discharge status: Home / Self Care | Payer: Medicare Other | Source: Ambulatory Visit | Attending: Urology | Admitting: Urology

## 2018-10-17 DIAGNOSIS — C7931 Secondary malignant neoplasm of brain: Secondary | ICD-10-CM | POA: Diagnosis not present

## 2018-10-17 DIAGNOSIS — C7951 Secondary malignant neoplasm of bone: Secondary | ICD-10-CM | POA: Diagnosis not present

## 2018-10-17 DIAGNOSIS — Z08 Encounter for follow-up examination after completed treatment for malignant neoplasm: Secondary | ICD-10-CM | POA: Diagnosis not present

## 2018-10-17 NOTE — Progress Notes (Signed)
Radiation Oncology         [phone] ________________________________  Name: Heather [name] MRN: [id number]  Date: 10/17/2018  DOB: 13-Dec-1945  Follow up Visit Note- Conducted via telephone due to current COVID-19 concerns for limiting patient exposure  CC: [name], MD  [name], MD  Diagnosis:   73 y.o. woman with ER positive, PR negative, HER-2 positive metastatic breast cancer with multifocal metastases involving the spine, bony skeleton, lung, and brain.   Interval Since Last Radiation: 2 months 08/07/18:  SRS Spine/ The targeted metastases in theT12 and L2vertebral bodiesweretreatedto a prescription dose of 18Gy in 1 fraction.   05/14/2018 - 05/25/2018:  Chest Wall, Right Upper Posterior subcutaneous lesions / treated to 30 Gy delivered in 10 fractions of 3 Gy  05/18/2017 - 05/31/2017:  The thoracic spine (T7-T9) was treated to 30 Gy in 10 fractions of 3 Gy.  6/27-7/18/18 SRS and palliative treatment: 1.  The rib 8th was treated to 30 Gy in 10 fractions 2.  Right cerebellar 6 mm target was treated using 3 Dynamic Conformal Arcs to a prescription dose of 20 Gy in one fraction  04/27/16-05/12/16: Left femur/ 30 Gy in 10 fractions  04/13/2015-05/19/2015 Postop:  55 Gy in 25 fractions of 2.2 Gy to the thoracic spine  10/25/12-12/11/12: 50.4 Gy to the right chest wall and regional lymph nodes in 28 fractions with a 10 Gy boost to the right chest wall in 1 session.  Narrative:  In summary, Heather [name] is a well known patient to our service with a history of metastatic breast cancer originally treated in 2014 to the chest wall and regional lymph nodes. She developed recurrent disease within the thoracic spine and subsequently has undergone 3 surgeries, in addition to radiotherapy, with her most recent surgery being on 06/13/2016 where she had removal of thoracic hardware and replacement of pedicle screws with stabilization.  Of note previously placed screws between T3, T4  T6 and T7 were identified and dissection was carried out inferiorly to T8, T9-T10 and T11, T8 through T11 pedicle screw trajectories were placed bilaterally. She recovered well since treatment to her spine as she had been paralyzed in 2016, and with rigorous therapy has been able to make an impressive recovery. She continues on systemic therapy under the care of [name] and is also followed along in the brain and spine oncology conference. PET scan on 10/10/16 revealed a new hypermetabolic 2.5 x 1.1 cm lesion in the posterior right 8th rib. She also mentioned having new onset headaches, dizziness, and an episode of visual change,so an MRI brain was performed 11/04/16 demonstrating a solitary contrast-enhancing lesion within the right cerebellum, measuring 6 x 2 mm and lying just beneath the right tentorial leaflet and concerning for a metastatic lesion.  This solitary lesion was confirmed with SRS protocol MRI brain on 11/16/16 with no new lesions noted. This lesion was treated with SRS on 11/23/16 which she tolerated very well. She also completed palliative radiotherapy to the right eighth rib from 11/02/16 - 11/23/16 with significant improvement in her pain. She remained on Herceptin, Perjeta, Faslodex and Zometa for systemic disease under the care and direction of [name].  She developed increased pain in the T8 region and also had PET positive findings in this region, as well as hypermetabolic right lower lobe mass concerning for progressive metastatic disease on follow up imaging 04/18/2017, despite systemic treatment. Her systemic therapy was switched to Kadcyla on 05/05/17 with discontinuation of Faslodex, Herceptin, and  Perjeta.  She proceeded with palliative radiotherapy to 30 Gy in 10 fractions to the thoracic spine (T7-T9) from 05/18/2017 - 05/31/2017.  This treatment was tolerated very well.  A repeat brain MRI performed 06/15/17 showed no new or progressive contrast enhancing lesions and no residual  abnormality at the site of the previous treated right cerebellar lesion but Chest CT on 07/26/17 showed clear disease progression with interval increase in size of the multiple enhancing masses posteriorly overlying the spine, interval development of left axillary and mediastinal adenopathy, increase in the size of lytic lesions in T8-T9 and left ninth rib, as well as increased groundglass opacities right lower lobe lung. Therefore, she was switched to Palbociclib + Faslodex + trastuzumab + Pertuzumab, started 08/10/17.   MRI T-spine on 09/21/17 showed the known T12 lesion had slightly progressed by a few millimeters from 03/15/17 and there was a new lesion at L2 which was not present on lumbar MRI from 07/13/16. Follow up systemic imaging on 12/13/17, showed a good response to treatment with improved LAN and decreased subcutaneous nodules in the upper thoracic region.  Follow up brain MRI imaging has continued to show interval increase in size the right parietal calvarium metastasis with stable appearance in size of a right frontal calvarial metastasis and no parenchymal or dural metastases. PET scan from 04/03/2018 unfortunately showed significant disease progression despite systemic therapy.  Her systemic chemotherapy was discontinued on 04/04/2018 due to disease progression and she was switched to [facility] (Fam-trastuzumab-Durextecan) q 3 weeks beginning 06/01/18.  Repeat MRI imaging of the brain and thoracic spine was performed on 07/12/2018.  Brain imaging demonstrated 3 skull metastases ranging from 5 to 14 mm in diameter which appeared stable to slightly increased since December 2019 but no new metastases or brain parenchymal or dural metastases identified.  On thoracic spine imaging there was enlargement of the left paracentral T12 vertebral body metastasis increased from 14 mm in May 2019 to 20 mm currently without associated pathologic fracture and otherwise stable appearance of previously treated lesions in the  thoracic spine with no new disease noted.  The posterior subcutaneous nodular metastases appear regressed.  Her imaging studies were reviewed and discussed at the multidisciplinary brain and spine conference on 07/16/2018 and the consensus recommendation was to proceed with stereotactic radiosurgery [facility]) to the progressive disease at T12 which was completed on 08/07/18.  At the time of her CT [facility] for treatment planning, it was also noted that she had an enlarging lesion in the vertebral body of L2 as compared to her PET scan from 03/28/2018 so this lesion was included in the planning and was treated with a single fraction SRS at the time of T12 treatment as well. She tolerated the treatment well and did not experience any ill side effects.  INTERVAL HISTORY:   I spoke with the patient to conduct her routine scheduled 3 month follow up visit to review her recent brain MRI via telephone to spare the patient unnecessary potential exposure in the healthcare setting during the current COVID-19 pandemic.  The patient was notified in advance and gave permission to proceed with this visit format.   She reports that she is doing well overall and has not experienced any ill side effects associated with her recent SRS spine treatment.  She denies back pain currently.  She has longstanding weakness in bilateral LEs, unchanged recently and she associates this with her inactivity due to pronounced fatigue following systemic treatments.  She reports that it begins to improve  approximately 2 weeks after her treatments when she is able to become more mobile but resumes following each treatment every 3 weeks.  She denies any new or increased paraesthesias in the lower extremities but reports neuropathy in bilateral UEs as well as "nerve pain" in the right axilla that travels down her right side/torso.  She also reports chronic right>left jaw pain and reports that her dentist has told her previously that she had some loss of bone in  the right jaw based on xray evaluation.  She is taking Zometa q 3 months and will discuss this with [name].  She has continued her systemic therapy with ENHERTU under the care and direction of [name] having completed 7 cycles as of 10/05/2018 and the current plan is to continue on this regimen given her recent systemic imaging with PET 10/03/18 shows a good response to treatment with less activity in the recently treated lesions at L2 and T12, no new bony lesions, resolution of subcutaneous nodules in the upper back and pubic area and resolution of right adrenal hypermetabolic mass.  Her recent brain MRI from 10/13/18 shows disease stability with no visible intraparenchymal metastases to the brain and an unchanged appearance of 3 calvarial metastases.  On review of systems, the patient reports that she continues doing well overall.  She continues with moderate fatigue which is tolerable and not significantly interfering with her daily activities aside from the first 2 weeks after systemic treatments.  She had a rough time with N/V following her last full dose chemo treatment in early May 2020 but her dose was reduced on 10/05/18 and she has tolerated this much better.  She has persistent low grade nausea but no vomiting and is able to eat.  She remains as active as she can be.  She has also noticed significant improvement in the size of the subcutaneous nodules and in the burning/tingling pain that radiated from the left aspect of the spine underneath her breast since completing radiotherapy.  She has not had recent changes in visual or auditory acuity, tinnitus, increased imbalance, tremor or seizure activity. She denies recent headaches, sternal chest pain, shortness of breath, cough, fevers, chills, night sweats, or recent unintended weight changes. She denies any bowel or bladder disturbances, and denies abdominal pain or vomiting currently. She denies any new musculoskeletal or joint aches or pains, new  skin lesions or concerns aside from those mentioned above in the HPI.  A complete review of systems is obtained and is otherwise negative.  Past Medical History:  Past Medical History:  Diagnosis Date   Anemia    hx of   Anxiety    r/t updated surgery   Breast cancer ([place])    right   Cataract    immature-not sure of which eye   Complication of anesthesia    pt states she is sensitive to meds   Dizziness    has been going on for 54month;medical MD aware   Genetic testing 07/11/2016   Test Results: Normal; No pathogenic mutations detected  Genes Analyzed: 43 genes on [employer]'s Common Cancers panel (APC, ATM, AXIN2, BARD1, BMPR1A, BRCA1, BRCA2, BRIP1, CDH1, CDKN2A, CHEK2, DICER1, EPCAM, GREM1, HOXB13, KIT, MEN1, MLH1, MSH2, MSH6, MUTYH, NBN, NF1, PALB2, PDGFRA, PMS2, POLD1, POLE, PTEN, RAD50, RAD51C, RAD51D, SDHA, SDHB, SDHC, SDHD, SMAD4, SMARCA4, STK11, TP53, TSC1, TSC2, VHL).   GERD (gastroesophageal reflux disease)    Tums prn   H. pylori infection    H/O hiatal hernia  Hemorrhoids    History of UTI    Hx of radiation therapy 10/25/12- 12/11/12   right chest wall/regional lymph nodes 5040 cGy, 28 sessions, right chest wall boost 1000 cGy 1 session   Hx: UTI (urinary tract infection)    Hyperlipidemia    but not on meds;diet and exercise controlled   Hypertension    recently started Aldactone    Insomnia    takes Melatoniin daily   Metastasis to spinal column (HCC)    T5, Left  Femur cancer- radiation    Neuropathy    "from back surgery"   PONV (postoperative nausea and vomiting)    pt experienced hair loss, confusion and combative- 02/15/15   Right knee pain    Right shoulder pain    Skin cancer    squamous, Nodule to back - "same cancer as breast."   Urinary frequency    d/t taking Aldactone    Past Surgical History: Past Surgical History:  Procedure Laterality Date   COLONOSCOPY     cyst removed from left breast  [place] 4 N/A 02/15/2015   Procedure: DECOMPRESSION T5 AND T3-T7 STABALIZATION;  Surgeon: Consuella Lose, MD;  Location: [place] NEUR[facility] ORS;  Service: Neurosurgery;  Laterality: N/A;   ESOPHAGOGASTRODUODENOSCOPY     LAMINECTOMY N/A 03/27/2015   Procedure: Thoracic Four-Thoracic Six Laminectomy for tumor;  Surgeon: Consuella Lose, MD;  Location: [place] NEURO ORS;  Service: Neurosurgery;  Laterality: N/A;  T4-T6 Laminectomy   left wrist surgery   2004   with plate   MASTECTOMY MODIFIED RADICAL  05/10/2012   Procedure: MASTECTOMY MODIFIED RADICAL;  Surgeon: Merrie Roof, MD;  Location: [place];  Service: General;  Laterality: Right;  RIGHT MODIFIED RADICAL MASTECTOMY   MASTECTOMY, RADICAL Right    MINOR BREAST BIOPSY Left 05/16/2016   Procedure: EXCISION OF BACK NODULE;  Surgeon: Autumn Messing III, MD;  Location: [place];  Service: General;  Laterality: Left;   Nodule  back  05/17/2015   PORTACATH PLACEMENT  05/10/2012   Procedure: INSERTION PORT-A-CATH;  Surgeon: Merrie Roof, MD;  Location: [place];  Service: General;  Laterality: Left;    Social History:  Social History   Socioeconomic History   Marital status: Married    Spouse name: Not on file   Number of children: 2   Years of education: Not on file   Highest education level: Not on file  Occupational History   Not on file  Social Needs   Financial resource strain: Not on file   Food insecurity:    Worry: Not on file    Inability: Not on file   Transportation needs:    Medical: Not on file    Non-medical: Not on file  Tobacco Use   Smoking status: Never Smoker   Smokeless tobacco: Never Used  Substance and Sexual Activity   Alcohol use: No   Drug use: No   Sexual activity: Yes    Birth control/protection: Post-menopausal  Lifestyle   Physical activity:    Days per week: Not on file    Minutes per session: Not on file   Stress: Not on file  Relationships   Social  connections:    Talks on phone: Not on file    Gets together: Not on file    Attends religious service: Not on file    Active member of club or organization: Not on file    Attends meetings of  clubs or organizations: Not on file    Relationship status: Not on file   Intimate partner violence:    Fear of current or ex partner: Not on file    Emotionally abused: Not on file    Physically abused: Not on file    Forced sexual activity: Not on file  Other Topics Concern   Not on file  Social History Narrative   Not on file    Family History: Family History  Problem Relation Age of Onset   Leukemia Mother 24       deceased 20   Stroke Father    Diabetes Mellitus II Father    Prostate cancer Brother 48       currently 81   Stomach cancer Maternal Grandmother 81       deceased 14   Prostate cancer Brother 49       currently 59     ALLERGIES:  is allergic to other; acyclovir and related; chlorhexidine; zanaflex [tizanidine hcl]; codeine; keflex [cephalexin]; naprosyn [naproxen]; oxycodone; tramadol; and tussin [guaifenesin].  Meds: Current Outpatient Medications  Medication Sig Dispense Refill   ALPRAZolam (XANAX) 0.25 MG tablet Take 1 tablet by mouth as needed.     Calcium Carbonate Antacid (TUMS PO) Take 2 tablets by mouth 2 (two) times daily as needed (acid reflux).     Cholecalciferol (VITAMIN D) 2000 UNITS tablet Take 2,000 Units by mouth daily.     gabapentin (NEURONTIN) 600 MG tablet Take 1 tablet (600 mg total) by mouth as directed. Take 1 tablet in the AM and afternoon and 1 1/2 tabs at bedtime (Patient taking differently: Take 600 mg by mouth 3 (three) times daily. Take 1 tablet in the AM and afternoon and 1 1/2 tabs at bedtime) 315 tablet 2   HYDROcodone-acetaminophen (NORCO/VICODIN) 5-325 MG tablet Take 1 tablet by mouth every 6 (six) hours as needed for moderate pain. (Patient not taking: Reported on 04/09/2018) 30 tablet 0   lidocaine-prilocaine (EMLA)  cream Apply to affected area once (Patient taking differently: Apply 1 application topically every 30 (thirty) days. Apply to port prior to infusions) 30 g 3   omeprazole (PRILOSEC) 40 MG capsule TAKE 1 CAPSULE BY MOUTH ONCE DAILY FOR 30 DAYS  5   ondansetron (ZOFRAN) 8 MG tablet Take 1 tablet (8 mg total) by mouth every 8 (eight) hours as needed for nausea or vomiting. 20 tablet 0   OVER THE COUNTER MEDICATION Place 1 drop into both eyes 2 (two) times daily as needed (dry eyes). Over the counter lubricating eye drops     polyethylene glycol (MIRALAX / GLYCOLAX) packet Take 17 g by mouth daily as needed.     Probiotic Product (PROBIOTIC DAILY PO) Take 1 capsule by mouth daily.      prochlorperazine (COMPAZINE) 10 MG tablet Take 1 tablet (10 mg total) by mouth every 6 (six) hours as needed for nausea or vomiting. 60 tablet 3   spironolactone (ALDACTONE) 25 MG tablet Take 25 mg by mouth daily.     No current facility-administered medications for this encounter.     Physical Findings:  Unable to assess due to telephone visit format.   Lab Findings: Lab Results  Component Value Date   WBC 2.1 (L) 10/05/2018   HGB 10.8 (L) 10/05/2018   HCT 33.9 (L) 10/05/2018   MCV 100.0 10/05/2018   PLT 102 (L) 10/05/2018     Radiographic Findings: Mr Jeri Cos XY Contrast  Result Date: 10/14/2018 CLINICAL DATA:  Metastatic breast cancer. Brain metastases with radiation treatment. EXAM: MRI HEAD WITHOUT AND WITH CONTRAST TECHNIQUE: Multiplanar, multiecho pulse sequences of the brain and surrounding structures were obtained without and with intravenous contrast. CONTRAST:  15m MULTIHANCE GADOBENATE DIMEGLUMINE 529 MG/ML IV SOLN COMPARISON:  Brain MRI 07/12/2018 FINDINGS: BRAIN: There is no acute infarct, acute hemorrhage or extra-axial collection. The midline structures are normal. No midline shift or other mass effect. Multifocal white matter hyperintensity, most commonly due to chronic ischemic  microangiopathy. The cerebral and cerebellar volume are age-appropriate. No hydrocephalus. Susceptibility-sensitive sequences show no chronic microhemorrhage or superficial siderosis. No mass lesion. VASCULAR: The major intracranial arterial and venous sinus flow voids are normal. SKULL AND UPPER CERVICAL SPINE: 3 contrast enhancing metastases of the calvarium are unchanged. These are located at the right middle cranial fossa-11 mm (series 11, image 45), right parietal bone-11 mm (image 116) and right frontal bone-5 mm (image 106). SINUSES/ORBITS: No fluid levels or advanced mucosal thickening. No mastoid or middle ear effusion. The orbits are normal. IMPRESSION: 1. No intraparenchymal metastases to the brain. 2. Unchanged appearance of 3 calvarial metastases. Electronically Signed   By: KUlyses JarredM.D.   On: 10/14/2018 00:01   Nm Pet Image Restag (ps) Skull Base To Thigh  Result Date: 10/03/2018 CLINICAL DATA:  Subsequent treatment strategy for stage IV breast cancer. EXAM: NUCLEAR MEDICINE PET SKULL BASE TO THIGH TECHNIQUE: 6.8 mCi F-18 FDG was injected intravenously. Full-ring PET imaging was performed from the skull base to thigh after the radiotracer. CT data was obtained and used for attenuation correction and anatomic localization. Fasting blood glucose: 92 mg/dl COMPARISON:  Multiple exams, including 04/03/2018 FINDINGS: Mediastinal blood pool activity: SUV max 2.9 Liver activity: SUV max NA NECK: No significant abnormal hypermetabolic activity in this region. Incidental CT findings: Mildly asymmetric glottis, query right vocal cord paralysis, similar to prior. CHEST: No significant abnormal hypermetabolic activity in this region. The subcutaneous lesions along the right upper back previously are no longer readily apparent. Incidental CT findings: Paramediastinal fibrosis. Right anterior lung fibrosis along radiation port. Bibasilar scarring. ABDOMEN/PELVIS: The previously seen right adrenal  nodularity is no longer evident. The previously seen subcutaneous lesion along the left side of the pubis is no longer present. Incidental CT findings: Aortoiliac atherosclerotic vascular disease. SKELETON: Mixed lucent and sclerotic lesion eccentric to the left in the T12 vertebral body measures 1.9 by 1.7 cm (formerly 1.7 by 1.4 cm) with a maximum SUV of 2.6 (formerly 3.8). The L2 lesion eccentric to the right in the vertebral body measures 2.0 by 2.2 cm (formerly 1.5 by 1.7 cm) with mixed lucent and sclerotic appearance and maximum SUV of 3.4 (formerly 10.2). Incidental CT findings: Stable sclerotic lesion in the right acromion is not hypermetabolic. Postoperative findings in the thoracic spine. IMPRESSION: 1. The 2 previously hypermetabolic bony lesions currently demonstrate mixed lucency and sclerosis and some mild increase in size compared to the prior exam, but are considerably less metabolically active. Overall the appearance favors improvement. No new bony lesions are identified. 2. The subcutaneous lesions along the right upper back and adjacent to the left pubis have resolved. 3. Radiation therapy related findings in the lungs without active lung lesions specific suspicious for malignancy. 4. The prior right adrenal nodularity has resolved. 5.  Aortic Atherosclerosis (ICD10-I70.0). Electronically Signed   By: WVan ClinesM.D.   On: 10/03/2018 14:50    Impression/Plan:  This visit was conducted via telephone to spare the patient unnecessary potential exposure in the  healthcare setting during the current COVID-19 pandemic. 25. 73 yo woman with ER positive breast cancer with a solitary brain metastasis and bony metastatses with diease to the spine.  Regarding her metastatic brain disease, she appears to be both clinically and radiographically stable with her recent brain MRI from 10/13/18 confirming disease stability with no visible intraparenchymal metastases to the brain and an unchanged  appearance of 3 calvarial metastases. As she remains asymptomatic regarding the calvarial lesions and without evidence of new or progressive parenchymal lesions, as per recommendations from recent multidisciplinary brain and spine conference, we will continue to monitor her closely with serial brain MRI scans every 3 months and office follow-up thereafter to review results.  Her next brain MRI will be due in September 2020.  Regarding her systemic disease, she will continue on her current chemotherapy regimen with routine follow-up for disease management under the care and direction of [name].   2. Spine metastases.  She appears to have recovered well from the effects of her most recent [place] treatment to T12 and L2   She is currently without complaints.  We discussed the recommendation to follow this closely with serial imaging every 3 months and she prefers to have this followed with systemic imaging under the care and direction of [name] and only repeat spine MRI if she becomes symptomatic.  We discussed that CT imaging is not the ideal imaging modailty for evaluation of the spine but certainly understand her concerns for limiting the number of imaging studies that she is subjected to on a routine basis.  Her recent PET imaging from 10/03/18 showed a good response to treatment with less activity in the recently treated lesions at L2 and T12, no new bony lesions. She is a most reliable patient and will let us know if she becomes symptomatic in the spine so that we can more thoroughly evaluate with MRI if/when necessary.  She appears to have a good understanding of her overall disease and our recommendations and is in agreement with the stated plan.  She knows to call at anytime in the interim with any questions or concerns.  Given current concerns for patient exposure during the COVID-19 pandemic, this encounter was conducted via telephone. The patient was notified in advance and was offered a [place]  meeting to allow for face to face communication but unfortunately reported that she did not have the appropriate resources/technology to support such a visit and instead preferred to proceed with telephone consult. The patient has given verbal consent for this type of encounter. The time spent during this encounter was 25 minutes with approximately 50% of that time spent in counseling and coordination of care. The attendants for this meeting include Grainger Mccarley PA-C and patient, Treva Huyett. During the encounter, Arieanna Pressey PA-C was located at [facility] Radiation Oncology Department.  Patient, Sireen Halk was located at home.   Nicholos Johns, PA-C

## 2018-10-22 DIAGNOSIS — K5900 Constipation, unspecified: Secondary | ICD-10-CM | POA: Diagnosis not present

## 2018-10-22 DIAGNOSIS — R1314 Dysphagia, pharyngoesophageal phase: Secondary | ICD-10-CM | POA: Diagnosis not present

## 2018-10-26 ENCOUNTER — Other Ambulatory Visit: Payer: Self-pay

## 2018-10-26 ENCOUNTER — Inpatient Hospital Stay: Payer: Medicare Other

## 2018-10-26 ENCOUNTER — Encounter: Payer: Self-pay | Admitting: Adult Health

## 2018-10-26 ENCOUNTER — Inpatient Hospital Stay (HOSPITAL_BASED_OUTPATIENT_CLINIC_OR_DEPARTMENT_OTHER): Payer: Medicare Other | Admitting: Adult Health

## 2018-10-26 VITALS — BP 145/66 | HR 70 | Temp 98.9°F | Resp 16 | Ht 64.0 in | Wt 125.6 lb

## 2018-10-26 DIAGNOSIS — G9529 Other cord compression: Secondary | ICD-10-CM

## 2018-10-26 DIAGNOSIS — Z170 Estrogen receptor positive status [ER+]: Secondary | ICD-10-CM

## 2018-10-26 DIAGNOSIS — C7951 Secondary malignant neoplasm of bone: Secondary | ICD-10-CM | POA: Diagnosis not present

## 2018-10-26 DIAGNOSIS — Z79811 Long term (current) use of aromatase inhibitors: Secondary | ICD-10-CM | POA: Diagnosis not present

## 2018-10-26 DIAGNOSIS — R6889 Other general symptoms and signs: Secondary | ICD-10-CM

## 2018-10-26 DIAGNOSIS — C7931 Secondary malignant neoplasm of brain: Secondary | ICD-10-CM

## 2018-10-26 DIAGNOSIS — D6959 Other secondary thrombocytopenia: Secondary | ICD-10-CM

## 2018-10-26 DIAGNOSIS — C50411 Malignant neoplasm of upper-outer quadrant of right female breast: Secondary | ICD-10-CM

## 2018-10-26 DIAGNOSIS — C50911 Malignant neoplasm of unspecified site of right female breast: Secondary | ICD-10-CM

## 2018-10-26 DIAGNOSIS — E559 Vitamin D deficiency, unspecified: Secondary | ICD-10-CM

## 2018-10-26 DIAGNOSIS — Z9221 Personal history of antineoplastic chemotherapy: Secondary | ICD-10-CM

## 2018-10-26 DIAGNOSIS — C792 Secondary malignant neoplasm of skin: Secondary | ICD-10-CM | POA: Diagnosis not present

## 2018-10-26 DIAGNOSIS — Z923 Personal history of irradiation: Secondary | ICD-10-CM

## 2018-10-26 DIAGNOSIS — Z5112 Encounter for antineoplastic immunotherapy: Secondary | ICD-10-CM | POA: Diagnosis not present

## 2018-10-26 LAB — CBC WITH DIFFERENTIAL (CANCER CENTER ONLY)
Abs Immature Granulocytes: 0 10*3/uL (ref 0.00–0.07)
Basophils Absolute: 0 10*3/uL (ref 0.0–0.1)
Basophils Relative: 1 %
Eosinophils Absolute: 0.1 10*3/uL (ref 0.0–0.5)
Eosinophils Relative: 2 %
HCT: 35.2 % — ABNORMAL LOW (ref 36.0–46.0)
Hemoglobin: 11.4 g/dL — ABNORMAL LOW (ref 12.0–15.0)
Immature Granulocytes: 0 %
Lymphocytes Relative: 10 %
Lymphs Abs: 0.3 10*3/uL — ABNORMAL LOW (ref 0.7–4.0)
MCH: 32.8 pg (ref 26.0–34.0)
MCHC: 32.4 g/dL (ref 30.0–36.0)
MCV: 101.1 fL — ABNORMAL HIGH (ref 80.0–100.0)
Monocytes Absolute: 0.3 10*3/uL (ref 0.1–1.0)
Monocytes Relative: 12 %
Neutro Abs: 1.8 10*3/uL (ref 1.7–7.7)
Neutrophils Relative %: 75 %
Platelet Count: 77 10*3/uL — ABNORMAL LOW (ref 150–400)
RBC: 3.48 MIL/uL — ABNORMAL LOW (ref 3.87–5.11)
RDW: 16.8 % — ABNORMAL HIGH (ref 11.5–15.5)
WBC Count: 2.5 10*3/uL — ABNORMAL LOW (ref 4.0–10.5)
nRBC: 0 % (ref 0.0–0.2)

## 2018-10-26 LAB — CMP (CANCER CENTER ONLY)
ALT: 23 U/L (ref 0–44)
AST: 37 U/L (ref 15–41)
Albumin: 3.7 g/dL (ref 3.5–5.0)
Alkaline Phosphatase: 81 U/L (ref 38–126)
Anion gap: 10 (ref 5–15)
BUN: 8 mg/dL (ref 8–23)
CO2: 24 mmol/L (ref 22–32)
Calcium: 9.7 mg/dL (ref 8.9–10.3)
Chloride: 105 mmol/L (ref 98–111)
Creatinine: 0.81 mg/dL (ref 0.44–1.00)
GFR, Est AFR Am: >60 mL/min (ref >60)
GFR, Estimated: >60 mL/min (ref >60)
Glucose, Bld: 97 mg/dL (ref 70–99)
Potassium: 4.1 mmol/L (ref 3.5–5.1)
Sodium: 139 mmol/L (ref 135–145)
Total Bilirubin: 0.6 mg/dL (ref 0.3–1.2)
Total Protein: 7.1 g/dL (ref 6.5–8.1)

## 2018-10-26 MED ORDER — CYANOCOBALAMIN 1000 MCG/ML IJ SOLN
INTRAMUSCULAR | Status: AC
  Filled 2018-10-26: qty 1

## 2018-10-26 MED ORDER — DEXTROSE 5 % IV SOLN
Freq: Once | INTRAVENOUS | Status: AC
  Administered 2018-10-26: 10:00:00 via INTRAVENOUS
  Filled 2018-10-26: qty 250

## 2018-10-26 MED ORDER — PALONOSETRON HCL INJECTION 0.25 MG/5ML
INTRAVENOUS | Status: AC
  Filled 2018-10-26: qty 5

## 2018-10-26 MED ORDER — DEXAMETHASONE SODIUM PHOSPHATE 10 MG/ML IJ SOLN
INTRAMUSCULAR | Status: AC
  Filled 2018-10-26: qty 1

## 2018-10-26 MED ORDER — PALONOSETRON HCL INJECTION 0.25 MG/5ML
0.2500 mg | Freq: Once | INTRAVENOUS | Status: AC
  Administered 2018-10-26: 0.25 mg via INTRAVENOUS

## 2018-10-26 MED ORDER — FAM-TRASTUZUMAB DERUXTECAN-NXKI CHEMO 100 MG IV SOLR
4.4000 mg/kg | Freq: Once | INTRAVENOUS | Status: AC
  Administered 2018-10-26: 272 mg via INTRAVENOUS
  Filled 2018-10-26: qty 13.6

## 2018-10-26 MED ORDER — HEPARIN SOD (PORK) LOCK FLUSH 100 UNIT/ML IV SOLN
500.0000 [IU] | Freq: Once | INTRAVENOUS | Status: AC | PRN
  Administered 2018-10-26: 500 [IU]
  Filled 2018-10-26: qty 5

## 2018-10-26 MED ORDER — CYANOCOBALAMIN 1000 MCG/ML IJ SOLN
1000.0000 ug | Freq: Once | INTRAMUSCULAR | Status: AC
  Administered 2018-10-26: 1000 ug via INTRAMUSCULAR

## 2018-10-26 MED ORDER — SODIUM CHLORIDE 0.9% FLUSH
10.0000 mL | INTRAVENOUS | Status: DC | PRN
  Administered 2018-10-26: 10 mL
  Filled 2018-10-26: qty 10

## 2018-10-26 MED ORDER — ACETAMINOPHEN 325 MG PO TABS
650.0000 mg | ORAL_TABLET | Freq: Once | ORAL | Status: AC
  Administered 2018-10-26: 650 mg via ORAL

## 2018-10-26 MED ORDER — DEXAMETHASONE SODIUM PHOSPHATE 10 MG/ML IJ SOLN
10.0000 mg | Freq: Once | INTRAMUSCULAR | Status: AC
  Administered 2018-10-26: 10 mg via INTRAVENOUS

## 2018-10-26 MED ORDER — SODIUM CHLORIDE 0.9% FLUSH
10.0000 mL | Freq: Once | INTRAVENOUS | Status: AC | PRN
  Administered 2018-10-26: 10 mL
  Filled 2018-10-26: qty 10

## 2018-10-26 MED ORDER — ACETAMINOPHEN 325 MG PO TABS
ORAL_TABLET | ORAL | Status: AC
  Filled 2018-10-26: qty 2

## 2018-10-26 NOTE — Assessment & Plan Note (Addendum)
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 MRI 02/14/2015: T5 large destructive lesion with pathologic compression fracture and extensive epidural tumor, involvement of T4, excision metastatic carcinoma ER 60%, PR 0%, HER-2 positive ratio 2.71 average copy #7.45 03/27/2015: T4-6 decompression surgery for spinal cord compression PET-CT 12/10/17Rt para-spinal mass mildy hypermetabolic SUV 5.9, lytic cortical lesion 4.8 cm lesion left prox femur suv 8.8 favor osseus met disease Echo 04/12/2017 shows well preserved ejection fraction of 55-60%  Treatment summary: 1. Resumed anastrozole 05/21/2015, but it has caused significant hair loss so we switched her to exemestane 25 mg once daily 07/23/2015 2. Herceptin every 3 week started 04/10/2015 3. Bone metastases: Zometa every 3 months-next duelate May/early June 4. Radiation therapy to the spine (Dr. [name]) 04/13/2015 to 05/19/2015 5. Radiation therapy to left femur 04/28/2016 to 05/12/2016  6. SRS to the brain lesion 11/24/2016 7.Kadcyla started 05/05/2017 discontinued 07/27/2017 8.Palbociclib +Faslodex +trastuzumab+ Pertuzumabstarted 08/10/2017, stopped 04/04/2018 9. Enhertu started 06/01/18 -------------------------------------------------------------------------------------------------------------------------- PET CT scan 04/03/2018: Multiple new bone lesions including skull, right L2, T12, left distal femur, right distal femur, right acromium. Posterior upper chest wall lesions increase in size and nodularity. MRI brain and thoracic spine on 07/12/2018:No parenchymal metastases. 3 skull metastases 5 to 14 mm stable to slightly increased since December MRI thoracic spine: Enlarged left paracentral T12 vertebral body  metastases 20 mm, posterior subcutaneous meds appear regressed.  XRT to T123/04/2019 PET CT scan 10/03/2018: stable, improvement in subcutaneous mets MRI brain 10/12/2018: no intraparenchymal metastases, calvarial metastases stable Current treatment:Cycle8day 1Enhertu (Fam-trastuzumab-Durextecan) Echo 04/09/2018 shows EF of 55-60%.  Toxicities: 1. Dry eyes/irritation/infection: she will continue to follow with ophthalmology. 2.Severe fatigue-improved with dose reduction 3. Pain: controlled right now with Gabapentin. 4.Hair loss 5. Nausea: resolved with dose reduction 6. Thrombocytopenia: plt count 77.  I personally reviewed with [name], he wants her to proceed with Enhertu as ordered today, with no further dose reduction or treatment delay.  Patient to return in 3 weeks for labs, f/u, treatment.

## 2018-10-26 NOTE — Patient Instructions (Signed)
[facility] Discharge Instructions for Patients Receiving Chemotherapy  Today you received the following chemotherapy agents: ENHERTU  To help prevent nausea and vomiting after your treatment, we encourage you to take your nausea medication as directed.   If you develop nausea and vomiting that is not controlled by your nausea medication, call the clinic.   BELOW ARE SYMPTOMS THAT SHOULD BE REPORTED IMMEDIATELY:  *FEVER GREATER THAN 100.5 F  *CHILLS WITH OR WITHOUT FEVER  NAUSEA AND VOMITING THAT IS NOT CONTROLLED WITH YOUR NAUSEA MEDICATION  *UNUSUAL SHORTNESS OF BREATH  *UNUSUAL BRUISING OR BLEEDING  TENDERNESS IN MOUTH AND THROAT WITH OR WITHOUT PRESENCE OF ULCERS  *URINARY PROBLEMS  *BOWEL PROBLEMS  UNUSUAL RASH Items with * indicate a potential emergency and should be followed up as soon as possible.  Feel free to call the clinic should you have any questions or concerns. The clinic phone number is [phone].  Please show the [facility] at check-in to the Emergency Department and triage nurse.

## 2018-10-26 NOTE — Progress Notes (Signed)
[name]    [name], MD [place] [address]   DIAGNOSIS: Cancer Staging Primary cancer of upper outer quadrant of right female breast [facility]) Staging form: Breast, AJCC 7th Edition - Clinical: Stage IIB (T2, N1, cM0) - Unsigned Staging comments: Staged at breast conference 11.6.13  - Pathologic: Stage IV (M1) - Signed by [name], NP on 06/22/2018   SUMMARY OF ONCOLOGIC HISTORY: Oncology History  Primary cancer of upper outer quadrant of right female breast ([place])  03/08/2012 Initial Diagnosis   Right breast invasive ductal carcinoma ER positive PR negative HER-2 positive Ki-67 44%; another breast mass biopsied in the anterior part of the breast which was also positive for malignancy that was HER-2 negative   03/14/2012 Cancer Staging   Staging form: Breast, AJCC 7th Edition - Pathologic: Stage IV (M1) - Signed by [name], NP on 06/22/2018   05/10/2012 Surgery   Right mastectomy and axillary lymph node dissection: Multifocal disease 5 cm, 1.7 cm, 1.6 cm, ER positive PR negative HER-2 positive Ki-67 44%, 3/7 lymph nodes positive   06/14/2012 - 06/12/2013 Chemotherapy   Adjuvant chemotherapy with [place] 6 followed by Herceptin maintenance   10/25/2012 - 12/11/2012 Radiation Therapy   Adjuvant radiation therapy   02/07/2013 -  Anti-estrogen oral therapy   Letrozole 2.5 mg daily   02/14/2015 Imaging   MRI spine: Large destructive T5 lesion with severe pathologic compression fracture and extensive epidural tumor, severe spinal stenosis with moderate cord compression, tumor involvement of T4   03/18/2015 PET scan   Residual enhancing soft tissue adjacent to the spinal cord. No evidence of metastatic disease. Nonspecific uptake the left nipple   03/27/2015 - 03/30/2015 Hospital Admission   T4-6 decompression for spinal cord compression (lower extremity paralysis)   04/10/2015 -  Chemotherapy    Palliative treatment with Herceptin every 3 weeks with letrozole 2.5 mg daily   04/13/2015 - 05/19/2015 Radiation Therapy   Palliative radiation treatment to the spine   09/01/2015 Imaging   CT chest abdomen pelvis: Pathologic fracture with posterior fusion at T5 no other evidence of metastatic disease in the chest abdomen pelvis   12/23/2015 Imaging   CT CAP: new nonspecific 0.6 cm lymph node was stated mediastinum needs follow-up CT, innumerable tiny groundglass pulmonary nodules throughout both lungs unchanged, patchy consolidation from radiation, T5 fracture, no mets   04/15/2016 PET scan   Rt para-spinal mass mildy hypermetabolic SUV 5.9, lytic cortical lesion 4.8 cm lesion left prox femur suv 8.8 favor osseus met disease   04/28/2016 - 05/12/2016 Radiation Therapy   Palliative XRT Left femur   05/24/2016 Procedure   Soft tissue mass biopsy back: Metastatic breast cancer ER 80%, PR 0%, Ki-67 50%, HER-2 positive ratio 2.25   06/09/2016 - 03/29/2017 Anti-estrogen oral therapy   Faslodex with Herceptin and Perjeta every 4 weeks   06/13/2016 - 06/15/2016 Hospital Admission   uncomplicated revision of previous thoracic instrumentation.   10/10/2016 PET scan   Interval development of new hypermetabolic 2.5 x 1.1 cm lesion posterior right eighth rib consistent with metastatic disease, stable right paraspinal lesion at T8, clustered soft tissue nodules in the posterior midline back near cervicothoracic junction smaller than before    11/03/2016 - 11/07/2016 Radiation Therapy   Palliative radiation to right eighth rib   11/04/2016 Imaging   Solitary contrast-enhancing lesion in the right cerebellum 6 x 2 mm concerning for metastatic lesion   11/24/2016 -  11/24/2016 Radiation Therapy   SRS to the brain lesion   04/18/2017 PET scan   New ill-defined rounded airspace opacity in posterior right lower lobe. Differential diagnosis includes malignancy and infectious/inflammatory process.Increased  hypermetabolic lytic lesion in T8 vertebral body, consistent with bone metastasis.  Increased hypermetabolic posterior upper chest wall subcutaneous nodules, highly suspicious metastatic disease. Further improvement in right posterior eighth rib metastasis.   05/04/2017 - 07/27/2017 Chemotherapy   Kadcyla every 3 weeks palliative chemotherapy stopped for progression   07/26/2017 Imaging   Interval increase in size of the multiple enhancing masses posteriorly overlying the spine, interval development of left axillary and mediastinal adenopathy, increase in the size of lytic lesions in T8-T9 and left ninth rib, increased groundglass opacities right lower lobe lung   07/27/2017 - 05/11/2018 Chemotherapy   Palbociclib + Faslodex + trastuzumab + Pertuzumab     04/03/2018 PET scan   PET CT scan showed progression of disease not only in the bones but also in the subcutaneous nodules.   05/15/2018 - 05/25/2018 Radiation Therapy   Palliative radiation to the back   06/01/2018 -  Chemotherapy   Enhertu (Transtuzumab Deruxtecan)    08/07/2018 - 08/07/2018 Radiation Therapy   T12 vertebral body SRS   Breast cancer metastasized to bone, right ([place])  02/14/2015 Initial Diagnosis   Breast cancer metastasized to bone, right ([place])   06/01/2018 -  Chemotherapy   The patient had palonosetron (ALOXI) injection 0.25 mg, 0.25 mg, Intravenous,  Once, 8 of 10 cycles Administration: 0.25 mg (06/01/2018), 0.25 mg (06/22/2018), 0.25 mg (08/24/2018), 0.25 mg (07/13/2018), 0.25 mg (08/03/2018), 0.25 mg (09/14/2018), 0.25 mg (10/05/2018), 0.25 mg (10/26/2018) fam-trastuzumab deruxtecan-nxki (ENHERTU) 332 mg in dextrose 5 % 100 mL chemo infusion, 5.4 mg/kg = 332 mg, Intravenous,  Once, 8 of 10 cycles Dose modification: 4.4 mg/kg (original dose 5.4 mg/kg, Cycle 7, Reason: Provider Judgment) Administration: 332 mg (06/01/2018), 332 mg (06/22/2018), 332 mg (07/13/2018), 332 mg (08/24/2018), 332 mg (08/03/2018), 332 mg (09/14/2018), 272 mg  (10/05/2018), 272 mg (10/26/2018)  for chemotherapy treatment.      CURRENT THERAPY: Enhertu  INTERVAL HISTORY: Heather [name] 73 y.o. female returns for evaluation prior to receiving her next cycle of Enhertu.  She underwent PET scan at end of May that determined response to treatment.  At her last appointment her dose was reduced down to 4.4 mg/kg due to fatigue and nausea that Shelaine was experiencing.  She notes that the fatigue has improved, and the nausea, resolved.  She is doing well today otherwise and has no other questions or concerns.     Patient Active Problem List   Diagnosis Date Noted  . Port-A-Cath in place 07/13/2018  . Goals of care, counseling/discussion 08/10/2017  . Brain metastasis ([place]) 11/18/2016  . Genetic testing 07/11/2016  . ALLERGY TO CHLORHEXADINE, USE BETADINE ONLY 06/23/2016  . Kyphosis of thoracic region 06/13/2016  . Plantar fasciitis, bilateral 04/20/2016  . Neuropathic pain 01/27/2016  . Anemia of chronic disease 05/21/2015  . Metastatic cancer to spine ([place]) 03/27/2015  . Thrombocytopenia ([place]) 03/12/2015  . Prerenal azotemia 03/12/2015  . Pain   . Recurrent UTI--due to Klebsiella   . Paraplegia at T4 level ([place]) 02/20/2015  . Neurogenic bowel 02/20/2015  . Neurogenic bladder 02/20/2015  . Postoperative anemia due to acute blood loss 02/17/2015  . Spinal cord compression due to malignant neoplasm metastatic to spine ([place]) 02/15/2015  . Epidural mass 02/15/2015  . Thoracic spine tumor   . Pathologic fracture of  vertebra 02/14/2015  . Pathologic fracture of thoracic vertebrae 02/14/2015  . Breast cancer metastasized to bone, right ([place]) 02/14/2015  . Hyperlipidemia 02/14/2015  . H. pylori infection   . HTN (hypertension) 04/17/2012  . Primary cancer of upper outer quadrant of right female breast ([place]) 03/08/2012    is allergic to other; acyclovir and related; chlorhexidine; zanaflex [tizanidine hcl]; codeine; keflex [cephalexin]; naprosyn  [naproxen]; oxycodone; tramadol; and tussin [guaifenesin].  MEDICAL HISTORY: Past Medical History:  Diagnosis Date  . Anemia    hx of  . Anxiety    r/t updated surgery  . Breast cancer ([place])    right  . Cataract    immature-not sure of which eye  . Complication of anesthesia    pt states she is sensitive to meds  . Dizziness    has been going on for 67month;medical MD aware  . Genetic testing 07/11/2016   Test Results: Normal; No pathogenic mutations detected  Genes Analyzed: 43 genes on [employer]'s Common Cancers panel (APC, ATM, AXIN2, BARD1, BMPR1A, BRCA1, BRCA2, BRIP1, CDH1, CDKN2A, CHEK2, DICER1, EPCAM, GREM1, HOXB13, KIT, MEN1, MLH1, MSH2, MSH6, MUTYH, NBN, NF1, PALB2, PDGFRA, PMS2, POLD1, POLE, PTEN, RAD50, RAD51C, RAD51D, SDHA, SDHB, SDHC, SDHD, SMAD4, SMARCA4, STK11, TP53, TSC1, TSC2, VHL).  .Marland KitchenGERD (gastroesophageal reflux disease)    Tums prn  . H. pylori infection   . H/O hiatal hernia   . Hemorrhoids   . History of UTI   . Hx of radiation therapy 10/25/12- 12/11/12   right chest wall/regional lymph nodes 5040 cGy, 28 sessions, right chest wall boost 1000 cGy 1 session  . Hx: UTI (urinary tract infection)   . Hyperlipidemia    but not on meds;diet and exercise controlled  . Hypertension    recently started Aldactone   . Insomnia    takes Melatoniin daily  . Metastasis to spinal column ([facility])    T5, Left  Femur cancer- radiation   . Neuropathy    "from back surgery"  . PONV (postoperative nausea and vomiting)    pt experienced hair loss, confusion and combative- 02/15/15  . Right knee pain   . Right shoulder pain   . Skin cancer    squamous, Nodule to back - "same cancer as breast."  . Urinary frequency    d/t taking Aldactone    SURGICAL HISTORY: Past Surgical History:  Procedure Laterality Date  . COLONOSCOPY    . cyst removed from left breast  1970  . DECOMPRESSIVE LUMBAR LAMINECTOMY LEVEL 4 N/A 02/15/2015   Procedure: DECOMPRESSION T5 AND T3-T7 STABALIZATION;   Surgeon: NConsuella Lose MD;  [facility];  Service: Neurosurgery;  Laterality: N/A;  . ESOPHAGOGASTRODUODENOSCOPY    . LAMINECTOMY N/A 03/27/2015   Procedure: Thoracic Four-Thoracic Six Laminectomy for tumor;  Surgeon: NConsuella Lose MD;  [facility];  Service: Neurosurgery;  Laterality: N/A;  T4-T6 Laminectomy  . left wrist surgery   2004   with plate  . MASTECTOMY MODIFIED RADICAL  05/10/2012   Procedure: MASTECTOMY MODIFIED RADICAL;  Surgeon: PMerrie Roof MD;  [facility]  Service: General;  Laterality: Right;  RIGHT MODIFIED RADICAL MASTECTOMY  . MASTECTOMY, RADICAL Right   . MINOR BREAST BIOPSY Left 05/16/2016   Procedure: EXCISION OF BACK NODULE;  Surgeon: PAutumn MessingIII, MD;  [facility]  Service: General;  Laterality: Left;  . Nodule  back  05/17/2015  . PORTACATH PLACEMENT  05/10/2012   Procedure: INSERTION PORT-A-CATH;  Surgeon: Merrie Roof, MD;  [facility];  Service: General;  Laterality: Left;    SOCIAL HISTORY: Social History   Socioeconomic History  . Marital status: Married    Spouse name: Not on file  . Number of children: 2  . Years of education: Not on file  . Highest education level: Not on file  Occupational History  . Not on file  Social Needs  . Financial resource strain: Not on file  . Food insecurity    Worry: Not on file    Inability: Not on file  . Transportation needs    Medical: Not on file    Non-medical: Not on file  Tobacco Use  . Smoking status: Never Smoker  . Smokeless tobacco: Never Used  Substance and Sexual Activity  . Alcohol use: No  . Drug use: No  . Sexual activity: Yes    Birth control/protection: Post-menopausal  Lifestyle  . Physical activity    Days per week: Not on file    Minutes per session: Not on file  . Stress: Not on file  Relationships  . Social Herbalist on phone: Not on file    Gets together: Not on file    Attends religious service:  Not on file    Active member of club or organization: Not on file    Attends meetings of clubs or organizations: Not on file    Relationship status: Not on file  . Intimate partner violence    Fear of current or ex partner: Not on file    Emotionally abused: Not on file    Physically abused: Not on file    Forced sexual activity: Not on file  Other Topics Concern  . Not on file  Social History Narrative  . Not on file    FAMILY HISTORY: Family History  Problem Relation Age of Onset  . Leukemia Mother 43       deceased 65  . Stroke Father   . Diabetes Mellitus II Father   . Prostate cancer Brother 66       currently 25  . Stomach cancer Maternal Grandmother 29       deceased 67  . Prostate cancer Brother 53       currently 75    Review of Systems  Constitutional: Positive for fatigue. Negative for appetite change, chills, fever and unexpected weight change.  HENT:   Negative for hearing loss, lump/mass, sore throat and trouble swallowing.   Eyes: Negative for eye problems and icterus.  Respiratory: Negative for chest tightness, cough and shortness of breath.   Cardiovascular: Negative for chest pain, leg swelling and palpitations.  Gastrointestinal: Negative for abdominal distention, abdominal pain, constipation, diarrhea, nausea and vomiting.  Endocrine: Negative for hot flashes.  Musculoskeletal: Negative for arthralgias.  Skin: Negative for itching and rash.  Neurological: Negative for dizziness, extremity weakness, headaches and numbness.  Hematological: Negative for adenopathy. Does not bruise/bleed easily.  Psychiatric/Behavioral: Negative for depression. The patient is not nervous/anxious.       PHYSICAL EXAMINATION  ECOG PERFORMANCE STATUS: 2 - Symptomatic, <50% confined to bed  Vitals:   10/26/18 0859  BP: (!) 145/66  Pulse: 70  Resp: 16  Temp: 98.9 F (37.2 C)  SpO2: 100%    Physical Exam Constitutional:      General: She is not in acute  distress.    Appearance: Normal appearance. She is not toxic-appearing.  HENT:     Head:  Normocephalic and atraumatic.     Mouth/Throat:     Mouth: Mucous membranes are moist.     Pharynx: Oropharynx is clear. No oropharyngeal exudate or posterior oropharyngeal erythema.  Eyes:     General: No scleral icterus.    Pupils: Pupils are equal, round, and reactive to light.  Cardiovascular:     Pulses: Normal pulses.     Heart sounds: Normal heart sounds.  Pulmonary:     Effort: Pulmonary effort is normal.     Breath sounds: Normal breath sounds.  Abdominal:     General: Abdomen is flat. Bowel sounds are normal. There is no distension.     Palpations: Abdomen is soft. There is no mass.     Tenderness: There is no abdominal tenderness.  Musculoskeletal:        General: No swelling.  Skin:    General: Skin is warm and dry.     Capillary Refill: Capillary refill takes less than 2 seconds.  Neurological:     General: No focal deficit present.     Mental Status: She is alert and oriented to person, place, and time.  Psychiatric:        Mood and Affect: Mood normal.        Behavior: Behavior normal.     LABORATORY DATA:  CBC    Component Value Date/Time   WBC 2.5 (L) 10/26/2018 0821   WBC 6.2 07/27/2017 0836   RBC 3.48 (L) 10/26/2018 0821   HGB 11.4 (L) 10/26/2018 0821   HGB 12.0 05/05/2017 1046   HCT 35.2 (L) 10/26/2018 0821   HCT 36.9 05/05/2017 1046   PLT 77 (L) 10/26/2018 0821   PLT 210 05/05/2017 1046   MCV 101.1 (H) 10/26/2018 0821   MCV 83.1 05/05/2017 1046   MCH 32.8 10/26/2018 0821   MCHC 32.4 10/26/2018 0821   RDW 16.8 (H) 10/26/2018 0821   RDW 14.8 (H) 05/05/2017 1046   LYMPHSABS 0.3 (L) 10/26/2018 0821   LYMPHSABS 0.6 (L) 05/05/2017 1046   MONOABS 0.3 10/26/2018 0821   MONOABS 0.3 05/05/2017 1046   EOSABS 0.1 10/26/2018 0821   EOSABS 0.1 05/05/2017 1046   BASOSABS 0.0 10/26/2018 0821   BASOSABS 0.1 05/05/2017 1046    CMP     Component Value Date/Time    NA 139 10/26/2018 0821   NA 137 05/05/2017 1046   K 4.1 10/26/2018 0821   K 3.9 05/05/2017 1046   CL 105 10/26/2018 0821   CL 101 10/25/2012 0946   CO2 24 10/26/2018 0821   CO2 26 05/05/2017 1046   GLUCOSE 97 10/26/2018 0821   GLUCOSE 88 05/05/2017 1046   GLUCOSE 90 10/25/2012 0946   BUN 8 10/26/2018 0821   BUN 14.1 05/05/2017 1046   CREATININE 0.81 10/26/2018 0821   CREATININE 0.8 05/05/2017 1046   CALCIUM 9.7 10/26/2018 0821   CALCIUM 10.3 05/05/2017 1046   PROT 7.1 10/26/2018 0821   PROT 7.2 05/05/2017 1046   ALBUMIN 3.7 10/26/2018 0821   ALBUMIN 3.7 05/05/2017 1046   AST 37 10/26/2018 0821   AST 26 05/05/2017 1046   ALT 23 10/26/2018 0821   ALT 17 05/05/2017 1046   ALKPHOS 81 10/26/2018 0821   ALKPHOS 74 05/05/2017 1046   BILITOT 0.6 10/26/2018 0821   BILITOT 0.43 05/05/2017 1046   GFRNONAA >60 10/26/2018 0821   GFRAA >60 10/26/2018 1572          ASSESSMENT and PLAN:   Primary cancer of upper outer quadrant of  right female breast ([place]) Right breast invasive ductal carcinoma ER positive PR negative HER-2 positive Ki-67 44% multifocal disease 3/7 lymph nodes positive T2 N1 M0 stage IIB status post adjuvant chemotherapy with TCH followed by Herceptin maintenance, adjuvant radiation therapy and Letrozole. MRI 02/14/2015: T5 large destructive lesion with pathologic compression fracture and extensive epidural tumor, involvement of T4, excision metastatic carcinoma ER 60%, PR 0%, HER-2 positive ratio 2.71 average copy #7.45 03/27/2015: T4-6 decompression surgery for spinal cord compression PET-CT 12/10/17Rt para-spinal mass mildy hypermetabolic SUV 5.9, lytic cortical lesion 4.8 cm lesion left prox femur suv 8.8 favor osseus met disease Echo 04/12/2017 shows well preserved ejection fraction of 55-60%  Treatment summary: 1. Resumed anastrozole 05/21/2015, but it has caused significant hair loss so we switched her to exemestane 25 mg once daily 07/23/2015 2.  Herceptin every 3 week started 04/10/2015 3. Bone metastases: Zometa every 3 months-next duelate May/early June 4. Radiation therapy to the spine (Dr. [name]) 04/13/2015 to 05/19/2015 5. Radiation therapy to left femur 04/28/2016 to 05/12/2016  6. SRS to the brain lesion 11/24/2016 7.Kadcyla started 05/05/2017 discontinued 07/27/2017 8.Palbociclib +Faslodex +trastuzumab+ Pertuzumabstarted 08/10/2017, stopped 04/04/2018 9. Enhertu started 06/01/18 -------------------------------------------------------------------------------------------------------------------------- PET CT scan 04/03/2018: Multiple new bone lesions including skull, right L2, T12, left distal femur, right distal femur, right acromium. Posterior upper chest wall lesions increase in size and nodularity. MRI brain and thoracic spine on 07/12/2018:No parenchymal metastases. 3 skull metastases 5 to 14 mm stable to slightly increased since December MRI thoracic spine: Enlarged left paracentral T12 vertebral body metastases 20 mm, posterior subcutaneous meds appear regressed.  XRT to T123/04/2019 PET CT scan 10/03/2018: stable, improvement in subcutaneous mets MRI brain 10/12/2018: no intraparenchymal metastases, calvarial metastases stable Current treatment:Cycle8day 1Enhertu (Fam-trastuzumab-Durextecan) Echo 04/09/2018 shows EF of 55-60%.  Toxicities: 1. Dry eyes/irritation/infection: she will continue to follow with ophthalmology. 2.Severe fatigue-improved with dose reduction 3. Pain: controlled right now with Gabapentin. 4.Hair loss 5. Nausea: resolved with dose reduction 6. Thrombocytopenia: plt count 77.  I personally reviewed with [name], he wants her to proceed with Enhertu as ordered today, with no further dose reduction or treatment delay.  Patient to return in 3 weeks for labs, f/u, treatment.    All questions were answered. The patient knows to call the clinic with any problems, questions or  concerns. We can certainly see the patient much sooner if necessary.  A total of (30) minutes of face-to-face time was spent with this patient with greater than 50% of that time in counseling and care-coordination.  This note was electronically signed. Scot Dock, NP 10/26/2018

## 2018-10-26 NOTE — Progress Notes (Signed)
OK to treat with plt count of 77 per [name]

## 2018-10-27 LAB — VITAMIN D 25 HYDROXY (VIT D DEFICIENCY, FRACTURES): Vit D, 25-Hydroxy: 33.2 ng/mL (ref 30.0–100.0)

## 2018-11-02 ENCOUNTER — Other Ambulatory Visit (HOSPITAL_COMMUNITY): Payer: Medicare Other

## 2018-11-02 ENCOUNTER — Encounter (HOSPITAL_COMMUNITY): Payer: Medicare Other | Admitting: Internal Medicine

## 2018-11-08 ENCOUNTER — Other Ambulatory Visit: Payer: Self-pay

## 2018-11-08 ENCOUNTER — Ambulatory Visit (HOSPITAL_COMMUNITY)
Admission: RE | Admit: 2018-11-08 | Discharge: 2018-11-08 | Disposition: A | Discharge status: Home / Self Care | Payer: Medicare Other | Source: Ambulatory Visit | Attending: Internal Medicine | Admitting: Internal Medicine

## 2018-11-08 ENCOUNTER — Other Ambulatory Visit: Payer: Self-pay | Admitting: Adult Health

## 2018-11-08 ENCOUNTER — Encounter (HOSPITAL_COMMUNITY): Payer: Self-pay | Admitting: Internal Medicine

## 2018-11-08 ENCOUNTER — Ambulatory Visit (INDEPENDENT_AMBULATORY_CARE_PROVIDER_SITE_OTHER)
Admission: RE | Admit: 2018-11-08 | Discharge: 2018-11-08 | Disposition: A | Discharge status: Home / Self Care | Payer: Medicare Other | Source: Ambulatory Visit | Attending: Internal Medicine | Admitting: Internal Medicine

## 2018-11-08 VITALS — BP 149/67 | HR 71 | Wt 125.2 lb

## 2018-11-08 DIAGNOSIS — Z853 Personal history of malignant neoplasm of breast: Secondary | ICD-10-CM

## 2018-11-08 DIAGNOSIS — Z8042 Family history of malignant neoplasm of prostate: Secondary | ICD-10-CM | POA: Insufficient documentation

## 2018-11-08 DIAGNOSIS — I10 Essential (primary) hypertension: Secondary | ICD-10-CM | POA: Insufficient documentation

## 2018-11-08 DIAGNOSIS — C50911 Malignant neoplasm of unspecified site of right female breast: Secondary | ICD-10-CM

## 2018-11-08 DIAGNOSIS — C50411 Malignant neoplasm of upper-outer quadrant of right female breast: Secondary | ICD-10-CM | POA: Diagnosis not present

## 2018-11-08 DIAGNOSIS — K219 Gastro-esophageal reflux disease without esophagitis: Secondary | ICD-10-CM | POA: Insufficient documentation

## 2018-11-08 DIAGNOSIS — Z881 Allergy status to other antibiotic agents status: Secondary | ICD-10-CM | POA: Insufficient documentation

## 2018-11-08 DIAGNOSIS — Z923 Personal history of irradiation: Secondary | ICD-10-CM | POA: Diagnosis not present

## 2018-11-08 DIAGNOSIS — Z87440 Personal history of urinary (tract) infections: Secondary | ICD-10-CM | POA: Diagnosis not present

## 2018-11-08 DIAGNOSIS — Z886 Allergy status to analgesic agent status: Secondary | ICD-10-CM | POA: Insufficient documentation

## 2018-11-08 DIAGNOSIS — Z170 Estrogen receptor positive status [ER+]: Secondary | ICD-10-CM | POA: Diagnosis not present

## 2018-11-08 DIAGNOSIS — Z833 Family history of diabetes mellitus: Secondary | ICD-10-CM | POA: Diagnosis not present

## 2018-11-08 DIAGNOSIS — Z806 Family history of leukemia: Secondary | ICD-10-CM | POA: Diagnosis not present

## 2018-11-08 DIAGNOSIS — R5381 Other malaise: Secondary | ICD-10-CM | POA: Diagnosis not present

## 2018-11-08 DIAGNOSIS — Z823 Family history of stroke: Secondary | ICD-10-CM | POA: Diagnosis not present

## 2018-11-08 DIAGNOSIS — C7951 Secondary malignant neoplasm of bone: Secondary | ICD-10-CM | POA: Diagnosis not present

## 2018-11-08 DIAGNOSIS — Z79899 Other long term (current) drug therapy: Secondary | ICD-10-CM | POA: Insufficient documentation

## 2018-11-08 DIAGNOSIS — Z888 Allergy status to other drugs, medicaments and biological substances status: Secondary | ICD-10-CM | POA: Diagnosis not present

## 2018-11-08 DIAGNOSIS — Z800 Family history of malignant neoplasm of digestive organs: Secondary | ICD-10-CM | POA: Insufficient documentation

## 2018-11-08 DIAGNOSIS — E785 Hyperlipidemia, unspecified: Secondary | ICD-10-CM | POA: Insufficient documentation

## 2018-11-08 DIAGNOSIS — R531 Weakness: Secondary | ICD-10-CM

## 2018-11-08 DIAGNOSIS — G4700 Insomnia, unspecified: Secondary | ICD-10-CM | POA: Insufficient documentation

## 2018-11-08 DIAGNOSIS — Z885 Allergy status to narcotic agent status: Secondary | ICD-10-CM | POA: Diagnosis not present

## 2018-11-08 LAB — ECHOCARDIOGRAM COMPLETE: Weight: 2003.2 oz

## 2018-11-08 NOTE — Addendum Note (Signed)
Encounter addended by: Jolaine Artist, MD on: 11/08/2018 11:35 AM  Actions taken: Level of Service modified

## 2018-11-08 NOTE — Patient Instructions (Signed)
Your physician recommends that you schedule a follow-up appointment in: 6 months with an echo.  You will get a call at a later date to schedule these appointments.  Your physician has requested that you have an echocardiogram. Echocardiography is a painless test that uses sound waves to create images of your heart. It provides your doctor with information about the size and shape of your heart and how well your heart's chambers and valves are working. This procedure takes approximately one hour. There are no restrictions for this procedure.  At the [facility], you and your health needs are our priority. As part of our continuing mission to provide you with exceptional heart care, we have created designated Provider Care Teams. These Care Teams include your primary Cardiologist (physician) and Advanced Practice Providers (APPs- Physician Assistants and Nurse Practitioners) who all work together to provide you with the care you need, when you need it.   You may see any of the following providers on your designated Care Team at your next follow up: Marland Kitchen Dr Glori Bickers . Dr Loralie Champagne . Darrick Grinder, NP

## 2018-11-08 NOTE — Progress Notes (Signed)
CARDIO-ONCOLOGY CLINIC NOTE  Patient ID: Heather [name], female   DOB: [birth year], 73 y.o.   MRN: [id number]  Referring Oncologist: Dr [name] Oncologistt: Dr [name] PCP: Dr [name] General Surgeon: Dr Marlou Starks  HPI: Heather [name] is a 73 year old female with invasive ductal carcinoma in R breast that is ER positive and HER-2/neu positive,  S/P R mastectomy and lymph node dissection 05/10/12.    She has been treated for HTN in the past but stopped taking the medications due to elevated renal function. Intolerant Ace-I due to cough.    She received a total of 6 cycles of Taxotere/carboplatin starting on 06/14/2012 and finishing in May. XRT finished in August 2014. Now receiving Herceptin every 3 weeks to end at the end of February 2015.    She finished initial Herceptin 2015 but unfortunately then developed pathological vertebral fracture with cord compression and paralysis. Now back on Herceptin since 12/16.  Found to have metastatic lesion to left femur. Underwent XRT.   Subsequently found to have a met to her 8th rib. Perjeta added to Herceptin starting in March 2018. Also getting Faslodex injections.    Here for f/u. PET scan 03/2018 with progression of mets. Palbociclib +Faslodex +trastuzumab+ Pertuzumabstarted 08/10/2017, stopped 04/04/2018. Now on Enhertu every 3 weeks (Fam-trastuzumab-Durextecan) started 06/01/18. Recent scan stable. Dose of Enhertu recently dropped due to severe n/v. Feels very weak. Hard to stand or walk. No HF symptoms.    Echo today EF 55-60% GLS -19.7  Echo 12/19 EF 55-60% GLS - 16.8%   Echo 6/19 EF 55-60%  GLS -23.5% Personally reviewed  Echo 12/18 EF 55-60% Grade I DD GLS -21.3% LS 7.4 cm/s (poor window)   Past Medical History:  Diagnosis Date  . Anemia    hx of  . Anxiety    r/t updated surgery  . Breast cancer ([facility])    right  . Cataract    immature-not sure of which eye  . Complication of anesthesia    pt states she is sensitive to meds  .  Dizziness    has been going on for 76month;medical MD aware  . Genetic testing 07/11/2016   Test Results: Normal; No pathogenic mutations detected  Genes Analyzed: 43 genes on [employer] Common Cancers panel (APC, ATM, AXIN2, BARD1, BMPR1A, BRCA1, BRCA2, BRIP1, CDH1, CDKN2A, CHEK2, DICER1, EPCAM, GREM1, HOXB13, KIT, MEN1, MLH1, MSH2, MSH6, MUTYH, NBN, NF1, PALB2, PDGFRA, PMS2, POLD1, POLE, PTEN, RAD50, RAD51C, RAD51D, SDHA, SDHB, SDHC, SDHD, SMAD4, SMARCA4, STK11, TP53, TSC1, TSC2, VHL).  .Marland KitchenGERD (gastroesophageal reflux disease)    Tums prn  . H. pylori infection   . H/O hiatal hernia   . Hemorrhoids   . History of UTI   . Hx of radiation therapy 10/25/12- 12/11/12   right chest wall/regional lymph nodes 5040 cGy, 28 sessions, right chest wall boost 1000 cGy 1 session  . Hx: UTI (urinary tract infection)   . Hyperlipidemia    but not on meds;diet and exercise controlled  . Hypertension    recently started Aldactone   . Insomnia    takes Melatoniin daily  . Metastasis to spinal column ([facility])    T5, Left  Femur cancer- radiation   . Neuropathy    "from back surgery"  . PONV (postoperative nausea and vomiting)    pt experienced hair loss, confusion and combative- 02/15/15  . Right knee pain   . Right shoulder pain   . Skin cancer    squamous, Nodule to back - "same cancer  as breast."  . Urinary frequency    d/t taking Aldactone    Current Outpatient Medications  Medication Sig Dispense Refill  . ALPRAZolam (XANAX) 0.25 MG tablet Take 1 tablet by mouth as needed.    . Calcium Carbonate Antacid (TUMS PO) Take 2 tablets by mouth 2 (two) times daily as needed (acid reflux).    . Cholecalciferol (VITAMIN D) 2000 UNITS tablet Take 2,000 Units by mouth daily.    . cycloSPORINE (RESTASIS) 0.05 % ophthalmic emulsion 1 drop 2 (two) times daily.    Marland Kitchen gabapentin (NEURONTIN) 600 MG tablet Take 1 tablet (600 mg total) by mouth as directed. Take 1 tablet in the AM and afternoon and 1 1/2 tabs at bedtime  (Patient taking differently: Take 600 mg by mouth 3 (three) times daily. Take 1 tablet in the AM and afternoon and 1 1/2 tabs at bedtime) 315 tablet 2  . HYDROcodone-acetaminophen (NORCO/VICODIN) 5-325 MG tablet Take 1 tablet by mouth every 6 (six) hours as needed for moderate pain. 30 tablet 0  . lidocaine-prilocaine (EMLA) cream Apply to affected area once (Patient taking differently: Apply 1 application topically every 30 (thirty) days. Apply to port prior to infusions) 30 g 3  . omeprazole (PRILOSEC) 40 MG capsule TAKE 1 CAPSULE BY MOUTH ONCE DAILY FOR 30 DAYS  5  . ondansetron (ZOFRAN) 8 MG tablet Take 1 tablet (8 mg total) by mouth every 8 (eight) hours as needed for nausea or vomiting. 20 tablet 0  . polyethylene glycol (MIRALAX / GLYCOLAX) packet Take 17 g by mouth daily as needed.    . Probiotic Product (PROBIOTIC DAILY PO) Take 1 capsule by mouth daily.     . prochlorperazine (COMPAZINE) 10 MG tablet Take 1 tablet (10 mg total) by mouth every 6 (six) hours as needed for nausea or vomiting. 60 tablet 3  . spironolactone (ALDACTONE) 25 MG tablet Take 25 mg by mouth daily.    Marland Kitchen OVER THE COUNTER MEDICATION Place 1 drop into both eyes 2 (two) times daily as needed (dry eyes). Over the counter lubricating eye drops     No current facility-administered medications for this encounter.      Allergies  Allergen Reactions  . Other Itching    Chlorhexadine Cloth wipes  . Acyclovir And Related   . Chlorhexidine   . Zanaflex [Tizanidine Hcl] Hives    Bumps  . Codeine     " loopy".   02/26/15: Also patient does not want to take oxycodone either (makes her feel like a "zombie")  . Keflex [Cephalexin]     GI Upset  . Naprosyn [Naproxen] Other (See Comments)    GI ipset   . Oxycodone Other (See Comments)    Made her feel like a "zombie" 02/25/15. Patient does not want to take oxycodone.  . Tramadol Itching  . Tussin [Guaifenesin] Other (See Comments)    Dizzy     Social History    Socioeconomic History  . Marital status: Married    Spouse name: Not on file  . Number of children: 2  . Years of education: Not on file  . Highest education level: Not on file  Occupational History  . Not on file  Social Needs  . Financial resource strain: Not on file  . Food insecurity    Worry: Not on file    Inability: Not on file  . Transportation needs    Medical: Not on file    Non-medical: Not on file  Tobacco Use  .  Smoking status: Never Smoker  . Smokeless tobacco: Never Used  Substance and Sexual Activity  . Alcohol use: No  . Drug use: No  . Sexual activity: Yes    Birth control/protection: Post-menopausal  Lifestyle  . Physical activity    Days per week: Not on file    Minutes per session: Not on file  . Stress: Not on file  Relationships  . Social Herbalist on phone: Not on file    Gets together: Not on file    Attends religious service: Not on file    Active member of club or organization: Not on file    Attends meetings of clubs or organizations: Not on file    Relationship status: Not on file  . Intimate partner violence    Fear of current or ex partner: Not on file    Emotionally abused: Not on file    Physically abused: Not on file    Forced sexual activity: Not on file  Other Topics Concern  . Not on file  Social History Narrative  . Not on file    Family History  Problem Relation Age of Onset  . Leukemia Mother 43       deceased 40  . Stroke Father   . Diabetes Mellitus II Father   . Prostate cancer Brother 16       currently 89  . Stomach cancer Maternal Grandmother 79       deceased 53  . Prostate cancer Brother 83       currently 75    PHYSICAL EXAM: Vitals:   11/08/18 1022  BP: (!) 149/67  Pulse: 71  SpO2: 100%   General:  Weak appearing. No resp difficulty HEENT: normal x alopecia Neck: supple. no JVD. Carotids 2+ bilat; no bruits. No lymphadenopathy or thryomegaly appreciated. Cor: R mastectomy  PMI  nondisplaced. Regular rate & rhythm. No rubs, gallops or murmurs. Lungs: clear Abdomen: soft, nontender, nondistended. No hepatosplenomegaly. No bruits or masses. Good bowel sounds. Extremities: no cyanosis, clubbing, rash, edema Neuro: alert & orientedx3, cranial nerves grossly intact. moves all 4 extremities w/o difficulty. Affect pleasant   ASSESSMENT & PLAN: 1. R breast CA with metastatic disease  - initial Herceptin completed in 2015. - Palliative Herceptin start 12/16. Added Perjeta in 3/18 - Started Andria Frames early 2019  - PET scan 03/2018 with progression of mets. Now on Enhertu every 3 weeks - most recent scans stable  - I reviewed echos personally. EF and Doppler parameters stable. No HF on exam. Continue Enhertu  2. HTN:  - mildly elevated but stable. Continue current regimen.    3. Debility - d/w Oncology. Will order HHPT/OT    Quillian Quince Ozell Juhasz,MD 11:00 AM

## 2018-11-12 NOTE — Assessment & Plan Note (Signed)
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 MRI 02/14/2015: T5 large destructive lesion with pathologic compression fracture and extensive epidural tumor, involvement of T4, excision metastatic carcinoma ER 60%, PR 0%, HER-2 positive ratio 2.71 average copy #7.45 03/27/2015: T4-6 decompression surgery for spinal cord compression PET-CT 12/10/17Rt para-spinal mass mildy hypermetabolic SUV 5.9, lytic cortical lesion 4.8 cm lesion left prox femur suv 8.8 favor osseus met disease Echo 04/12/2017 shows well preserved ejection fraction of 55-60%  Treatment summary: 1. Resumed anastrozole 05/21/2015, but it has caused significant hair loss so we switched her to exemestane 25 mg once daily 07/23/2015 2. Herceptin every 3 week started 04/10/2015 3. Bone metastases: Zometa every 3 months-next duelate May/early June 4. Radiation therapy to the spine (Dr. [name]) 04/13/2015 to 05/19/2015 5. Radiation therapy to left femur 04/28/2016 to 05/12/2016  6. SRS to the brain lesion 11/24/2016 7.Kadcyla started 05/05/2017 discontinued 07/27/2017 8.Palbociclib +Faslodex +trastuzumab+ Pertuzumabstarted 08/10/2017, stopped 04/04/2018 9. Enhertu started 06/01/18 -------------------------------------------------------------------------------------------------------------------------- PET CT scan 04/03/2018: Multiple new bone lesions including skull, right L2, T12, left distal femur, right distal femur, right acromium. Posterior upper chest wall lesions increase in size and nodularity. MRI brain and thoracic spine on 07/12/2018:No parenchymal metastases. 3 skull metastases 5 to 14 mm stable to slightly increased since December MRI thoracic spine: Enlarged left paracentral T12 vertebral body  metastases 20 mm, posterior subcutaneous meds appear regressed.  XRT to T123/04/2019 PET CT scan 10/03/2018: stable, improvement in subcutaneous mets MRI brain 10/12/2018: no intraparenchymal metastases, calvarial metastases stable  Current treatment:Cycle9day 1Enhertu (Fam-trastuzumab-Durextecan) Echo 04/09/2018 shows EF of 55-60%.  Toxicities: 1. Dry eyes/irritation/infection: she will continue to follow with ophthalmology. 2.Severe fatigue-improved with dose reduction 3. Pain: controlled right now with Gabapentin. 4.Hair loss 5. Nausea: resolved with dose reduction 6. Thrombocytopenia: plt count 77.  Dose reduced with cycle 7.  Patient to return in 3 weeks for labs, f/u, treatment.

## 2018-11-15 NOTE — Progress Notes (Signed)
Patient Care Team: [name], MD as PCP - General (Family Medicine)  DIAGNOSIS:    ICD-10-CM   1. Primary cancer of upper outer quadrant of right female breast ([place])  C50.411     SUMMARY OF ONCOLOGIC HISTORY: Oncology History  Primary cancer of upper outer quadrant of right female breast ([place])  03/08/2012 Initial Diagnosis   Right breast invasive ductal carcinoma ER positive PR negative HER-2 positive Ki-67 44%; another breast mass biopsied in the anterior part of the breast which was also positive for malignancy that was HER-2 negative   03/14/2012 Cancer Staging   Staging form: Breast, AJCC 7th Edition - Pathologic: Stage IV (M1) - Signed by [name], NP on 06/22/2018   05/10/2012 Surgery   Right mastectomy and axillary lymph node dissection: Multifocal disease 5 cm, 1.7 cm, 1.6 cm, ER positive PR negative HER-2 positive Ki-67 44%, 3/7 lymph nodes positive   06/14/2012 - 06/12/2013 Chemotherapy   Adjuvant chemotherapy with Pierce 6 followed by Herceptin maintenance   10/25/2012 - 12/11/2012 Radiation Therapy   Adjuvant radiation therapy   02/07/2013 -  Anti-estrogen oral therapy   Letrozole 2.5 mg daily   02/14/2015 Imaging   MRI spine: Large destructive T5 lesion with severe pathologic compression fracture and extensive epidural tumor, severe spinal stenosis with moderate cord compression, tumor involvement of T4   03/18/2015 PET scan   Residual enhancing soft tissue adjacent to the spinal cord. No evidence of metastatic disease. Nonspecific uptake the left nipple   03/27/2015 - 03/30/2015 Hospital Admission   T4-6 decompression for spinal cord compression (lower extremity paralysis)   04/10/2015 -  Chemotherapy   Palliative treatment with Herceptin every 3 weeks with letrozole 2.5 mg daily   04/13/2015 - 05/19/2015 Radiation Therapy   Palliative radiation treatment to the spine   09/01/2015 Imaging   CT chest abdomen pelvis: Pathologic fracture with posterior  fusion at T5 no other evidence of metastatic disease in the chest abdomen pelvis   12/23/2015 Imaging   CT CAP: new nonspecific 0.6 cm lymph node was stated mediastinum needs follow-up CT, innumerable tiny groundglass pulmonary nodules throughout both lungs unchanged, patchy consolidation from radiation, T5 fracture, no mets   04/15/2016 PET scan   Rt para-spinal mass mildy hypermetabolic SUV 5.9, lytic cortical lesion 4.8 cm lesion left prox femur suv 8.8 favor osseus met disease   04/28/2016 - 05/12/2016 Radiation Therapy   Palliative XRT Left femur   05/24/2016 Procedure   Soft tissue mass biopsy back: Metastatic breast cancer ER 80%, PR 0%, Ki-67 50%, HER-2 positive ratio 2.25   06/09/2016 - 03/29/2017 Anti-estrogen oral therapy   Faslodex with Herceptin and Perjeta every 4 weeks   06/13/2016 - 06/15/2016 Hospital Admission   uncomplicated revision of previous thoracic instrumentation.   10/10/2016 PET scan   Interval development of new hypermetabolic 2.5 x 1.1 cm lesion posterior right eighth rib consistent with metastatic disease, stable right paraspinal lesion at T8, clustered soft tissue nodules in the posterior midline back near cervicothoracic junction smaller than before    11/03/2016 - 11/07/2016 Radiation Therapy   Palliative radiation to right eighth rib   11/04/2016 Imaging   Solitary contrast-enhancing lesion in the right cerebellum 6 x 2 mm concerning for metastatic lesion   11/24/2016 - 11/24/2016 Radiation Therapy   SRS to the brain lesion   04/18/2017 PET scan   New ill-defined rounded airspace opacity in posterior right lower lobe. Differential diagnosis includes malignancy and infectious/inflammatory process.Increased hypermetabolic lytic lesion  in T8 vertebral body, consistent with bone metastasis.  Increased hypermetabolic posterior upper chest wall subcutaneous nodules, highly suspicious metastatic disease. Further improvement in right posterior eighth rib metastasis.    05/04/2017 - 07/27/2017 Chemotherapy   Kadcyla every 3 weeks palliative chemotherapy stopped for progression   07/26/2017 Imaging   Interval increase in size of the multiple enhancing masses posteriorly overlying the spine, interval development of left axillary and mediastinal adenopathy, increase in the size of lytic lesions in T8-T9 and left ninth rib, increased groundglass opacities right lower lobe lung   07/27/2017 - 05/11/2018 Chemotherapy   Palbociclib + Faslodex + trastuzumab + Pertuzumab     04/03/2018 PET scan   PET CT scan showed progression of disease not only in the bones but also in the subcutaneous nodules.   05/15/2018 - 05/25/2018 Radiation Therapy   Palliative radiation to the back   06/01/2018 -  Chemotherapy   Enhertu (Transtuzumab Deruxtecan)    08/07/2018 - 08/07/2018 Radiation Therapy   T12 vertebral body SRS   Breast cancer metastasized to bone, right ([place])  02/14/2015 Initial Diagnosis   Breast cancer metastasized to bone, right ([place])   06/01/2018 -  Chemotherapy   The patient had palonosetron (ALOXI) injection 0.25 mg, 0.25 mg, Intravenous,  Once, 8 of 10 cycles Administration: 0.25 mg (06/01/2018), 0.25 mg (06/22/2018), 0.25 mg (08/24/2018), 0.25 mg (07/13/2018), 0.25 mg (08/03/2018), 0.25 mg (09/14/2018), 0.25 mg (10/05/2018), 0.25 mg (10/26/2018) fam-trastuzumab deruxtecan-nxki (ENHERTU) 332 mg in dextrose 5 % 100 mL chemo infusion, 5.4 mg/kg = 332 mg, Intravenous,  Once, 8 of 10 cycles Dose modification: 4.4 mg/kg (original dose 5.4 mg/kg, Cycle 7, Reason: Provider Judgment) Administration: 332 mg (06/01/2018), 332 mg (06/22/2018), 332 mg (07/13/2018), 332 mg (08/24/2018), 332 mg (08/03/2018), 332 mg (09/14/2018), 272 mg (10/05/2018), 272 mg (10/26/2018)  for chemotherapy treatment.      CHIEF COMPLIANT: Cycle 9 Enhertu  INTERVAL HISTORY: Heather [name] is a 73 y.o. with above-mentioned history of metastatic breast cancer currently on chemotherapy with trastuzumab-Deruxtecan  (Enhertu). She presents to the clinic today for cycle 9.  She is experiencing hair loss as well as fatigue as result of the treatment.  REVIEW OF SYSTEMS:   Constitutional: Denies fevers, chills or abnormal weight loss Eyes: Denies blurriness of vision Ears, nose, mouth, throat, and face: Denies mucositis or sore throat Respiratory: Denies cough, dyspnea or wheezes Cardiovascular: Denies palpitation, chest discomfort Gastrointestinal: Denies nausea, heartburn or change in bowel habits Skin: Denies abnormal skin rashes Lymphatics: Denies new lymphadenopathy or easy bruising Neurological: Denies numbness, tingling or new weaknesses Behavioral/Psych: Mood is stable, no new changes  Extremities: No lower extremity edema Breast: denies any pain or lumps or nodules in either breasts All other systems were reviewed with the patient and are negative.  I have reviewed the past medical history, past surgical history, social history and family history with the patient and they are unchanged from previous note.  ALLERGIES:  is allergic to other; acyclovir and related; chlorhexidine; zanaflex [tizanidine hcl]; codeine; keflex [cephalexin]; naprosyn [naproxen]; oxycodone; tramadol; and tussin [guaifenesin].  MEDICATIONS:  Current Outpatient Medications  Medication Sig Dispense Refill  . ALPRAZolam (XANAX) 0.25 MG tablet Take 1 tablet by mouth as needed.    . Calcium Carbonate Antacid (TUMS PO) Take 2 tablets by mouth 2 (two) times daily as needed (acid reflux).    . Cholecalciferol (VITAMIN D) 2000 UNITS tablet Take 2,000 Units by mouth daily.    . cycloSPORINE (RESTASIS) 0.05 % ophthalmic emulsion 1  drop 2 (two) times daily.    Marland Kitchen gabapentin (NEURONTIN) 600 MG tablet Take 1 tablet (600 mg total) by mouth as directed. Take 1 tablet in the AM and afternoon and 1 1/2 tabs at bedtime (Patient taking differently: Take 600 mg by mouth 3 (three) times daily. Take 1 tablet in the AM and afternoon and 1 1/2  tabs at bedtime) 315 tablet 2  . HYDROcodone-acetaminophen (NORCO/VICODIN) 5-325 MG tablet Take 1 tablet by mouth every 6 (six) hours as needed for moderate pain. 30 tablet 0  . lidocaine-prilocaine (EMLA) cream Apply to affected area once (Patient taking differently: Apply 1 application topically every 30 (thirty) days. Apply to port prior to infusions) 30 g 3  . omeprazole (PRILOSEC) 40 MG capsule TAKE 1 CAPSULE BY MOUTH ONCE DAILY FOR 30 DAYS  5  . ondansetron (ZOFRAN) 8 MG tablet Take 1 tablet (8 mg total) by mouth every 8 (eight) hours as needed for nausea or vomiting. 20 tablet 0  . OVER THE COUNTER MEDICATION Place 1 drop into both eyes 2 (two) times daily as needed (dry eyes). Over the counter lubricating eye drops    . polyethylene glycol (MIRALAX / GLYCOLAX) packet Take 17 g by mouth daily as needed.    . Probiotic Product (PROBIOTIC DAILY PO) Take 1 capsule by mouth daily.     . prochlorperazine (COMPAZINE) 10 MG tablet Take 1 tablet (10 mg total) by mouth every 6 (six) hours as needed for nausea or vomiting. 60 tablet 3  . spironolactone (ALDACTONE) 25 MG tablet Take 25 mg by mouth daily.     No current facility-administered medications for this visit.     PHYSICAL EXAMINATION: ECOG PERFORMANCE STATUS: 1 - Symptomatic but completely ambulatory  There were no vitals filed for this visit. There were no vitals filed for this visit.  GENERAL: alert, no distress and comfortable SKIN: skin color, texture, turgor are normal, no rashes or significant lesions EYES: normal, Conjunctiva are pink and non-injected, sclera clear OROPHARYNX: no exudate, no erythema and lips, buccal mucosa, and tongue normal  NECK: supple, thyroid normal size, non-tender, without nodularity LYMPH: no palpable lymphadenopathy in the cervical, axillary or inguinal LUNGS: clear to auscultation and percussion with normal breathing effort HEART: regular rate & rhythm and no murmurs and no lower extremity edema  ABDOMEN: abdomen soft, non-tender and normal bowel sounds MUSCULOSKELETAL: no cyanosis of digits and no clubbing  NEURO: alert & oriented x 3 with fluent speech, no focal motor/sensory deficits EXTREMITIES: No lower extremity edema  LABORATORY DATA:  I have reviewed the data as listed CMP Latest Ref Rng & Units 10/26/2018 10/05/2018 09/14/2018  Glucose 70 - 99 mg/dL 97 150(H) 114(H)  BUN 8 - 23 mg/dL '8 10 10  '$ Creatinine 0.44 - 1.00 mg/dL 0.81 0.80 0.79  Sodium 135 - 145 mmol/L 139 138 136  Potassium 3.5 - 5.1 mmol/L 4.1 3.7 4.0  Chloride 98 - 111 mmol/L 105 104 102  CO2 22 - 32 mmol/L '24 25 25  '$ Calcium 8.9 - 10.3 mg/dL 9.7 9.6 9.1  Total Protein 6.5 - 8.1 g/dL 7.1 6.6 7.2  Total Bilirubin 0.3 - 1.2 mg/dL 0.6 0.5 0.5  Alkaline Phos 38 - 126 U/L 81 79 83  AST 15 - 41 U/L 37 29 35  ALT 0 - 44 U/L '23 18 20    '$ Lab Results  Component Value Date   WBC 2.6 (L) 11/16/2018   HGB 11.5 (L) 11/16/2018   HCT 35.2 (L) 11/16/2018  MCV 100.9 (H) 11/16/2018   PLT 76 (L) 11/16/2018   NEUTROABS 1.8 11/16/2018    ASSESSMENT & PLAN:  Primary cancer of upper outer quadrant of right female breast ([place]) Right breast invasive ductal carcinoma ER positive PR negative HER-2 positive Ki-67 44% multifocal disease 3/7 lymph nodes positive T2 N1 M0 stage IIB status post adjuvant chemotherapy with TCH followed by Herceptin maintenance, adjuvant radiation therapy and Letrozole. MRI 02/14/2015: T5 large destructive lesion with pathologic compression fracture and extensive epidural tumor, involvement of T4, excision metastatic carcinoma ER 60%, PR 0%, HER-2 positive ratio 2.71 average copy #7.45 03/27/2015: T4-6 decompression surgery for spinal cord compression PET-CT 12/10/17Rt para-spinal mass mildy hypermetabolic SUV 5.9, lytic cortical lesion 4.8 cm lesion left prox femur suv 8.8 favor osseus met disease Echo 04/12/2017 shows well preserved ejection fraction of 55-60%  Treatment summary: 1. Resumed  anastrozole 05/21/2015, but it has caused significant hair loss so we switched her to exemestane 25 mg once daily 07/23/2015 2. Herceptin every 3 week started 04/10/2015 3. Bone metastases: Zometa every 3 months-next duelate May/early June 4. Radiation therapy to the spine (Dr. [name]) 04/13/2015 to 05/19/2015 5. Radiation therapy to left femur 04/28/2016 to 05/12/2016  6. SRS to the brain lesion 11/24/2016 7.Kadcyla started 05/05/2017 discontinued 07/27/2017 8.Palbociclib +Faslodex +trastuzumab+ Pertuzumabstarted 08/10/2017, stopped 04/04/2018 9. Enhertu started 06/01/18 -------------------------------------------------------------------------------------------------------------------------- PET CT scan 04/03/2018: Multiple new bone lesions including skull, right L2, T12, left distal femur, right distal femur, right acromium. Posterior upper chest wall lesions increase in size and nodularity. MRI brain and thoracic spine on 07/12/2018:No parenchymal metastases. 3 skull metastases 5 to 14 mm stable to slightly increased since December MRI thoracic spine: Enlarged left paracentral T12 vertebral body metastases 20 mm, posterior subcutaneous meds appear regressed.  XRT to T123/04/2019 PET CT scan 10/03/2018: stable, improvement in subcutaneous mets MRI brain 10/12/2018: no intraparenchymal metastases, calvarial metastases stable  Current treatment:Cycle9day 1Enhertu (Fam-trastuzumab-Durextecan) Echo 04/09/2018 shows EF of 55-60%.  Toxicities: 1. Dry eyes/irritation/infection: she will continue to follow with ophthalmology. 2.Severe fatigue-improved with dose reduction 3. Pain: controlled right now with Gabapentin. 4.Hair loss 5. Nausea: resolved with dose reduction 6. Thrombocytopenia: plt count 77.  Dose reduced with cycle 7.  Patient to return in 3 weeks for labs, f/u, treatment. Scans will be done 12/27/2018 Home health PT OT has been ordered   No orders of the defined  types were placed in this encounter.  The patient has a good understanding of the overall plan. she agrees with it. she will call with any problems that may develop before the next visit here.  [name], MD 11/16/2018  Julious Oka Dorshimer am acting as scribe for Dr. [name].  I have reviewed the above documentation for accuracy and completeness, and I agree with the above.      I think at some point although physical we have discussed

## 2018-11-16 ENCOUNTER — Other Ambulatory Visit: Payer: Self-pay

## 2018-11-16 ENCOUNTER — Inpatient Hospital Stay: Payer: Medicare Other | Attending: Hematology and Oncology

## 2018-11-16 ENCOUNTER — Inpatient Hospital Stay: Payer: Medicare Other

## 2018-11-16 ENCOUNTER — Inpatient Hospital Stay (HOSPITAL_BASED_OUTPATIENT_CLINIC_OR_DEPARTMENT_OTHER): Payer: Medicare Other | Admitting: Hematology and Oncology

## 2018-11-16 DIAGNOSIS — Z923 Personal history of irradiation: Secondary | ICD-10-CM

## 2018-11-16 DIAGNOSIS — C7951 Secondary malignant neoplasm of bone: Secondary | ICD-10-CM

## 2018-11-16 DIAGNOSIS — Z79811 Long term (current) use of aromatase inhibitors: Secondary | ICD-10-CM | POA: Insufficient documentation

## 2018-11-16 DIAGNOSIS — C50411 Malignant neoplasm of upper-outer quadrant of right female breast: Secondary | ICD-10-CM

## 2018-11-16 DIAGNOSIS — Z170 Estrogen receptor positive status [ER+]: Secondary | ICD-10-CM | POA: Diagnosis not present

## 2018-11-16 DIAGNOSIS — Z5112 Encounter for antineoplastic immunotherapy: Secondary | ICD-10-CM | POA: Diagnosis not present

## 2018-11-16 DIAGNOSIS — Z79899 Other long term (current) drug therapy: Secondary | ICD-10-CM

## 2018-11-16 DIAGNOSIS — D696 Thrombocytopenia, unspecified: Secondary | ICD-10-CM | POA: Insufficient documentation

## 2018-11-16 DIAGNOSIS — C50911 Malignant neoplasm of unspecified site of right female breast: Secondary | ICD-10-CM

## 2018-11-16 DIAGNOSIS — G9529 Other cord compression: Secondary | ICD-10-CM

## 2018-11-16 LAB — CBC WITH DIFFERENTIAL (CANCER CENTER ONLY)
Abs Immature Granulocytes: 0 10*3/uL (ref 0.00–0.07)
Basophils Absolute: 0 10*3/uL (ref 0.0–0.1)
Basophils Relative: 1 %
Eosinophils Absolute: 0.1 10*3/uL (ref 0.0–0.5)
Eosinophils Relative: 3 %
HCT: 35.2 % — ABNORMAL LOW (ref 36.0–46.0)
Hemoglobin: 11.5 g/dL — ABNORMAL LOW (ref 12.0–15.0)
Immature Granulocytes: 0 %
Lymphocytes Relative: 12 %
Lymphs Abs: 0.3 10*3/uL — ABNORMAL LOW (ref 0.7–4.0)
MCH: 33 pg (ref 26.0–34.0)
MCHC: 32.7 g/dL (ref 30.0–36.0)
MCV: 100.9 fL — ABNORMAL HIGH (ref 80.0–100.0)
Monocytes Absolute: 0.4 10*3/uL (ref 0.1–1.0)
Monocytes Relative: 14 %
Neutro Abs: 1.8 10*3/uL (ref 1.7–7.7)
Neutrophils Relative %: 70 %
Platelet Count: 76 10*3/uL — ABNORMAL LOW (ref 150–400)
RBC: 3.49 MIL/uL — ABNORMAL LOW (ref 3.87–5.11)
RDW: 16 % — ABNORMAL HIGH (ref 11.5–15.5)
WBC Count: 2.6 10*3/uL — ABNORMAL LOW (ref 4.0–10.5)
nRBC: 0 % (ref 0.0–0.2)

## 2018-11-16 LAB — CMP (CANCER CENTER ONLY)
ALT: 17 U/L (ref 0–44)
AST: 33 U/L (ref 15–41)
Albumin: 3.6 g/dL (ref 3.5–5.0)
Alkaline Phosphatase: 95 U/L (ref 38–126)
Anion gap: 10 (ref 5–15)
BUN: 10 mg/dL (ref 8–23)
CO2: 24 mmol/L (ref 22–32)
Calcium: 9.2 mg/dL (ref 8.9–10.3)
Chloride: 106 mmol/L (ref 98–111)
Creatinine: 0.78 mg/dL (ref 0.44–1.00)
GFR, Est AFR Am: >60 mL/min (ref >60)
GFR, Estimated: >60 mL/min (ref >60)
Glucose, Bld: 102 mg/dL — ABNORMAL HIGH (ref 70–99)
Potassium: 4 mmol/L (ref 3.5–5.1)
Sodium: 140 mmol/L (ref 135–145)
Total Bilirubin: 0.5 mg/dL (ref 0.3–1.2)
Total Protein: 7 g/dL (ref 6.5–8.1)

## 2018-11-16 MED ORDER — SODIUM CHLORIDE 0.9% FLUSH
10.0000 mL | Freq: Once | INTRAVENOUS | Status: AC | PRN
  Administered 2018-11-16: 10 mL
  Filled 2018-11-16: qty 10

## 2018-11-16 MED ORDER — FAM-TRASTUZUMAB DERUXTECAN-NXKI CHEMO 100 MG IV SOLR
200.0000 mg | Freq: Once | INTRAVENOUS | Status: AC
  Administered 2018-11-16: 200 mg via INTRAVENOUS
  Filled 2018-11-16: qty 10

## 2018-11-16 MED ORDER — DEXAMETHASONE SODIUM PHOSPHATE 10 MG/ML IJ SOLN
INTRAMUSCULAR | Status: AC
  Filled 2018-11-16: qty 1

## 2018-11-16 MED ORDER — ACETAMINOPHEN 325 MG PO TABS
ORAL_TABLET | ORAL | Status: AC
  Filled 2018-11-16: qty 2

## 2018-11-16 MED ORDER — PALONOSETRON HCL INJECTION 0.25 MG/5ML
INTRAVENOUS | Status: AC
  Filled 2018-11-16: qty 5

## 2018-11-16 MED ORDER — DEXAMETHASONE SODIUM PHOSPHATE 10 MG/ML IJ SOLN
10.0000 mg | Freq: Once | INTRAMUSCULAR | Status: AC
  Administered 2018-11-16: 10 mg via INTRAVENOUS

## 2018-11-16 MED ORDER — DEXTROSE 5 % IV SOLN
Freq: Once | INTRAVENOUS | Status: AC
  Administered 2018-11-16: 10:00:00 via INTRAVENOUS
  Filled 2018-11-16: qty 250

## 2018-11-16 MED ORDER — ACETAMINOPHEN 325 MG PO TABS
650.0000 mg | ORAL_TABLET | Freq: Once | ORAL | Status: AC
  Administered 2018-11-16: 650 mg via ORAL

## 2018-11-16 MED ORDER — PALONOSETRON HCL INJECTION 0.25 MG/5ML
0.2500 mg | Freq: Once | INTRAVENOUS | Status: AC
  Administered 2018-11-16: 0.25 mg via INTRAVENOUS

## 2018-11-16 MED ORDER — CYANOCOBALAMIN 1000 MCG/ML IJ SOLN
INTRAMUSCULAR | Status: AC
  Filled 2018-11-16: qty 1

## 2018-11-16 MED ORDER — HEPARIN SOD (PORK) LOCK FLUSH 100 UNIT/ML IV SOLN
500.0000 [IU] | Freq: Once | INTRAVENOUS | Status: AC | PRN
  Administered 2018-11-16: 500 [IU]
  Filled 2018-11-16: qty 5

## 2018-11-16 MED ORDER — SODIUM CHLORIDE 0.9% FLUSH
10.0000 mL | INTRAVENOUS | Status: DC | PRN
  Administered 2018-11-16: 10 mL
  Filled 2018-11-16: qty 10

## 2018-11-16 MED ORDER — ZOLEDRONIC ACID 4 MG/100ML IV SOLN
4.0000 mg | Freq: Once | INTRAVENOUS | Status: AC
  Administered 2018-11-16: 4 mg via INTRAVENOUS
  Filled 2018-11-16: qty 100

## 2018-11-16 MED ORDER — CYANOCOBALAMIN 1000 MCG/ML IJ SOLN
1000.0000 ug | Freq: Once | INTRAMUSCULAR | Status: AC
  Administered 2018-11-16: 1000 ug via INTRAMUSCULAR

## 2018-11-16 MED ORDER — SODIUM CHLORIDE 0.9 % IV SOLN
Freq: Once | INTRAVENOUS | Status: DC
  Filled 2018-11-16: qty 250

## 2018-11-16 NOTE — Patient Instructions (Signed)
Heather [name] Discharge Instructions for Patients Receiving Chemotherapy  Today you received the following chemotherapy agents: ENHERTU.  To help prevent nausea and vomiting after your treatment, we encourage you to take your nausea medication as directed.   If you develop nausea and vomiting that is not controlled by your nausea medication, call the clinic.   BELOW ARE SYMPTOMS THAT SHOULD BE REPORTED IMMEDIATELY:  *FEVER GREATER THAN 100.5 F  *CHILLS WITH OR WITHOUT FEVER  NAUSEA AND VOMITING THAT IS NOT CONTROLLED WITH YOUR NAUSEA MEDICATION  *UNUSUAL SHORTNESS OF BREATH  *UNUSUAL BRUISING OR BLEEDING  TENDERNESS IN MOUTH AND THROAT WITH OR WITHOUT PRESENCE OF ULCERS  *URINARY PROBLEMS  *BOWEL PROBLEMS  UNUSUAL RASH Items with * indicate a potential emergency and should be followed up as soon as possible.  Feel free to call the clinic should you have any questions or concerns. The clinic phone number is [phone].  Please show the [place] at check-in to the Emergency Department and triage nurse.  Cyanocobalamin, Vitamin B12 injection What is this medicine? CYANOCOBALAMIN (sye an oh koe BAL a min) is a man made form of vitamin B12. Vitamin B12 is used in the growth of healthy blood cells, nerve cells, and proteins in the body. It also helps with the metabolism of fats and carbohydrates. This medicine is used to treat people who can not absorb vitamin B12. This medicine may be used for other purposes; ask your health care provider or pharmacist if you have questions. COMMON BRAND NAME(S): B-12 Compliance Kit, B-12 Injection Kit, Cyomin, LA-12, Nutri-Twelve, Physicians EZ Use B-12, Primabalt What should I tell my health care provider before I take this medicine? They need to know if you have any of these conditions:  kidney disease  Leber's disease  megaloblastic anemia  an unusual or allergic reaction to cyanocobalamin, cobalt, other  medicines, foods, dyes, or preservatives  pregnant or trying to get pregnant  breast-feeding How should I use this medicine? This medicine is injected into a muscle or deeply under the skin. It is usually given by a health care professional in a clinic or doctor's office. However, your doctor may teach you how to inject yourself. Follow all instructions. Talk to your pediatrician regarding the use of this medicine in children. Special care may be needed. Overdosage: If you think you have taken too much of this medicine contact a poison control center or emergency room at once. NOTE: This medicine is only for you. Do not share this medicine with others. What if I miss a dose? If you are given your dose at a clinic or doctor's office, call to reschedule your appointment. If you give your own injections and you miss a dose, take it as soon as you can. If it is almost time for your next dose, take only that dose. Do not take double or extra doses. What may interact with this medicine?  colchicine  heavy alcohol intake This list may not describe all possible interactions. Give your health care provider a list of all the medicines, herbs, non-prescription drugs, or dietary supplements you use. Also tell them if you smoke, drink alcohol, or use illegal drugs. Some items may interact with your medicine. What should I watch for while using this medicine? Visit your doctor or health care professional regularly. You may need blood work done while you are taking this medicine. You may need to follow a special diet. Talk to your doctor. Limit your alcohol intake and  avoid smoking to get the best benefit. What side effects may I notice from receiving this medicine? Side effects that you should report to your doctor or health care professional as soon as possible:  allergic reactions like skin rash, itching or hives, swelling of the face, lips, or tongue  blue tint to skin  chest tightness,  pain  difficulty breathing, wheezing  dizziness  red, swollen painful area on the leg Side effects that usually do not require medical attention (report to your doctor or health care professional if they continue or are bothersome):  diarrhea  headache This list may not describe all possible side effects. Call your doctor for medical advice about side effects. You may report side effects to FDA at 1-800-FDA-1088. Where should I keep my medicine? Keep out of the reach of children. Store at room temperature between 15 and 30 degrees C (59 and 85 degrees F). Protect from light. Throw away any unused medicine after the expiration date. NOTE: This sheet is a summary. It may not cover all possible information. If you have questions about this medicine, talk to your doctor, pharmacist, or health care provider.  2020 Elsevier/Gold Standard (2007-08-06 22:10:20)

## 2018-11-16 NOTE — Progress Notes (Signed)
Okay to treat with platelet count of 76 per [name].

## 2018-11-19 DIAGNOSIS — C7951 Secondary malignant neoplasm of bone: Secondary | ICD-10-CM | POA: Diagnosis not present

## 2018-11-19 DIAGNOSIS — R531 Weakness: Secondary | ICD-10-CM | POA: Diagnosis not present

## 2018-11-19 DIAGNOSIS — Z170 Estrogen receptor positive status [ER+]: Secondary | ICD-10-CM | POA: Diagnosis not present

## 2018-11-19 DIAGNOSIS — C50411 Malignant neoplasm of upper-outer quadrant of right female breast: Secondary | ICD-10-CM | POA: Diagnosis not present

## 2018-11-20 ENCOUNTER — Telehealth: Payer: Self-pay | Admitting: Hematology and Oncology

## 2018-11-20 NOTE — Telephone Encounter (Signed)
I talk with patient regarding schedule  

## 2018-11-26 DIAGNOSIS — Z170 Estrogen receptor positive status [ER+]: Secondary | ICD-10-CM | POA: Diagnosis not present

## 2018-11-26 DIAGNOSIS — R531 Weakness: Secondary | ICD-10-CM | POA: Diagnosis not present

## 2018-11-26 DIAGNOSIS — C50411 Malignant neoplasm of upper-outer quadrant of right female breast: Secondary | ICD-10-CM | POA: Diagnosis not present

## 2018-11-26 DIAGNOSIS — C7951 Secondary malignant neoplasm of bone: Secondary | ICD-10-CM | POA: Diagnosis not present

## 2018-12-04 ENCOUNTER — Other Ambulatory Visit: Payer: Self-pay | Admitting: Radiation Therapy

## 2018-12-04 DIAGNOSIS — C7931 Secondary malignant neoplasm of brain: Secondary | ICD-10-CM

## 2018-12-04 DIAGNOSIS — C7949 Secondary malignant neoplasm of other parts of nervous system: Secondary | ICD-10-CM

## 2018-12-06 NOTE — Progress Notes (Signed)
Patient Care Team: [name], MD as PCP - General (Family Medicine)  DIAGNOSIS:    ICD-10-CM   1. Primary cancer of upper outer quadrant of right female breast ([place])  C50.411     SUMMARY OF ONCOLOGIC HISTORY: Oncology History  Primary cancer of upper outer quadrant of right female breast ([place])  03/08/2012 Initial Diagnosis   Right breast invasive ductal carcinoma ER positive PR negative HER-2 positive Ki-67 44%; another breast mass biopsied in the anterior part of the breast which was also positive for malignancy that was HER-2 negative   03/14/2012 Cancer Staging   Staging form: Breast, AJCC 7th Edition - Pathologic: Stage IV (M1) - Signed by [name], NP on 06/22/2018   05/10/2012 Surgery   Right mastectomy and axillary lymph node dissection: Multifocal disease 5 cm, 1.7 cm, 1.6 cm, ER positive PR negative HER-2 positive Ki-67 44%, 3/7 lymph nodes positive   06/14/2012 - 06/12/2013 Chemotherapy   Adjuvant chemotherapy with Pierce 6 followed by Herceptin maintenance   10/25/2012 - 12/11/2012 Radiation Therapy   Adjuvant radiation therapy   02/07/2013 -  Anti-estrogen oral therapy   Letrozole 2.5 mg daily   02/14/2015 Imaging   MRI spine: Large destructive T5 lesion with severe pathologic compression fracture and extensive epidural tumor, severe spinal stenosis with moderate cord compression, tumor involvement of T4   03/18/2015 PET scan   Residual enhancing soft tissue adjacent to the spinal cord. No evidence of metastatic disease. Nonspecific uptake the left nipple   03/27/2015 - 03/30/2015 Hospital Admission   T4-6 decompression for spinal cord compression (lower extremity paralysis)   04/10/2015 -  Chemotherapy   Palliative treatment with Herceptin every 3 weeks with letrozole 2.5 mg daily   04/13/2015 - 05/19/2015 Radiation Therapy   Palliative radiation treatment to the spine   09/01/2015 Imaging   CT chest abdomen pelvis: Pathologic fracture with posterior  fusion at T5 no other evidence of metastatic disease in the chest abdomen pelvis   12/23/2015 Imaging   CT CAP: new nonspecific 0.6 cm lymph node was stated mediastinum needs follow-up CT, innumerable tiny groundglass pulmonary nodules throughout both lungs unchanged, patchy consolidation from radiation, T5 fracture, no mets   04/15/2016 PET scan   Rt para-spinal mass mildy hypermetabolic SUV 5.9, lytic cortical lesion 4.8 cm lesion left prox femur suv 8.8 favor osseus met disease   04/28/2016 - 05/12/2016 Radiation Therapy   Palliative XRT Left femur   05/24/2016 Procedure   Soft tissue mass biopsy back: Metastatic breast cancer ER 80%, PR 0%, Ki-67 50%, HER-2 positive ratio 2.25   06/09/2016 - 03/29/2017 Anti-estrogen oral therapy   Faslodex with Herceptin and Perjeta every 4 weeks   06/13/2016 - 06/15/2016 Hospital Admission   uncomplicated revision of previous thoracic instrumentation.   10/10/2016 PET scan   Interval development of new hypermetabolic 2.5 x 1.1 cm lesion posterior right eighth rib consistent with metastatic disease, stable right paraspinal lesion at T8, clustered soft tissue nodules in the posterior midline back near cervicothoracic junction smaller than before    11/03/2016 - 11/07/2016 Radiation Therapy   Palliative radiation to right eighth rib   11/04/2016 Imaging   Solitary contrast-enhancing lesion in the right cerebellum 6 x 2 mm concerning for metastatic lesion   11/24/2016 - 11/24/2016 Radiation Therapy   SRS to the brain lesion   04/18/2017 PET scan   New ill-defined rounded airspace opacity in posterior right lower lobe. Differential diagnosis includes malignancy and infectious/inflammatory process.Increased hypermetabolic lytic lesion  in T8 vertebral body, consistent with bone metastasis.  Increased hypermetabolic posterior upper chest wall subcutaneous nodules, highly suspicious metastatic disease. Further improvement in right posterior eighth rib metastasis.    05/04/2017 - 07/27/2017 Chemotherapy   Kadcyla every 3 weeks palliative chemotherapy stopped for progression   07/26/2017 Imaging   Interval increase in size of the multiple enhancing masses posteriorly overlying the spine, interval development of left axillary and mediastinal adenopathy, increase in the size of lytic lesions in T8-T9 and left ninth rib, increased groundglass opacities right lower lobe lung   07/27/2017 - 05/11/2018 Chemotherapy   Palbociclib + Faslodex + trastuzumab + Pertuzumab     04/03/2018 PET scan   PET CT scan showed progression of disease not only in the bones but also in the subcutaneous nodules.   05/15/2018 - 05/25/2018 Radiation Therapy   Palliative radiation to the back   06/01/2018 -  Chemotherapy   Enhertu (Transtuzumab Deruxtecan)    08/07/2018 - 08/07/2018 Radiation Therapy   T12 vertebral body SRS   Breast cancer metastasized to bone, right ([place])  02/14/2015 Initial Diagnosis   Breast cancer metastasized to bone, right ([place])   06/01/2018 -  Chemotherapy   The patient had palonosetron (ALOXI) injection 0.25 mg, 0.25 mg, Intravenous,  Once, 9 of 10 cycles Administration: 0.25 mg (06/01/2018), 0.25 mg (06/22/2018), 0.25 mg (08/24/2018), 0.25 mg (07/13/2018), 0.25 mg (08/03/2018), 0.25 mg (09/14/2018), 0.25 mg (10/05/2018), 0.25 mg (11/16/2018), 0.25 mg (10/26/2018) fam-trastuzumab deruxtecan-nxki (ENHERTU) 332 mg in dextrose 5 % 100 mL chemo infusion, 5.4 mg/kg = 332 mg, Intravenous,  Once, 9 of 10 cycles Dose modification: 4.4 mg/kg (original dose 5.4 mg/kg, Cycle 7, Reason: Provider Judgment), 3.2 mg/kg (original dose 5.4 mg/kg, Cycle 9, Reason: Dose not tolerated) Administration: 332 mg (06/01/2018), 332 mg (06/22/2018), 332 mg (07/13/2018), 332 mg (08/24/2018), 332 mg (08/03/2018), 332 mg (09/14/2018), 272 mg (10/05/2018), 200 mg (11/16/2018), 272 mg (10/26/2018)  for chemotherapy treatment.      CHIEF COMPLIANT: Cycle 10 Enhertu  INTERVAL HISTORY: Heather [name] is a 73 y.o.  with above-mentioned history of metastatic breast cancercurrently onchemotherapy with trastuzumab-Deruxtecan (Enhertu). She presents to the clinic today for cycle 10 Nausea is under control.  Constipation is a major problem for which she is taking multiple medications.  Denies any lack of appetite.  REVIEW OF SYSTEMS:   Constitutional: Denies fevers, chills or abnormal weight loss Eyes: Denies blurriness of vision Ears, nose, mouth, throat, and face: Denies mucositis or sore throat Respiratory: Denies cough, dyspnea or wheezes Cardiovascular: Denies palpitation, chest discomfort Gastrointestinal: Denies nausea, heartburn or change in bowel habits Skin: Denies abnormal skin rashes Lymphatics: Denies new lymphadenopathy or easy bruising Neurological: Denies numbness, tingling or new weaknesses Behavioral/Psych: Mood is stable, no new changes  Extremities: No lower extremity edema Breast: denies any pain or lumps or nodules in either breasts All other systems were reviewed with the patient and are negative.  I have reviewed the past medical history, past surgical history, social history and family history with the patient and they are unchanged from previous note.  ALLERGIES:  is allergic to other; acyclovir and related; chlorhexidine; zanaflex [tizanidine hcl]; codeine; keflex [cephalexin]; naprosyn [naproxen]; oxycodone; tramadol; and tussin [guaifenesin].  MEDICATIONS:  Current Outpatient Medications  Medication Sig Dispense Refill  . ALPRAZolam (XANAX) 0.25 MG tablet Take 1 tablet by mouth as needed.    . Calcium Carbonate Antacid (TUMS PO) Take 2 tablets by mouth 2 (two) times daily as needed (acid reflux).    Marland Kitchen  Cholecalciferol (VITAMIN D) 2000 UNITS tablet Take 2,000 Units by mouth daily.    . cycloSPORINE (RESTASIS) 0.05 % ophthalmic emulsion 1 drop 2 (two) times daily.    Marland Kitchen gabapentin (NEURONTIN) 600 MG tablet Take 1 tablet (600 mg total) by mouth as directed. Take 1 tablet in the  AM and afternoon and 1 1/2 tabs at bedtime (Patient taking differently: Take 600 mg by mouth 3 (three) times daily. Take 1 tablet in the AM and afternoon and 1 1/2 tabs at bedtime) 315 tablet 2  . HYDROcodone-acetaminophen (NORCO/VICODIN) 5-325 MG tablet Take 1 tablet by mouth every 6 (six) hours as needed for moderate pain. 30 tablet 0  . lidocaine-prilocaine (EMLA) cream Apply to affected area once (Patient taking differently: Apply 1 application topically every 30 (thirty) days. Apply to port prior to infusions) 30 g 3  . omeprazole (PRILOSEC) 40 MG capsule TAKE 1 CAPSULE BY MOUTH ONCE DAILY FOR 30 DAYS  5  . ondansetron (ZOFRAN) 8 MG tablet Take 1 tablet (8 mg total) by mouth every 8 (eight) hours as needed for nausea or vomiting. 20 tablet 0  . OVER THE COUNTER MEDICATION Place 1 drop into both eyes 2 (two) times daily as needed (dry eyes). Over the counter lubricating eye drops    . polyethylene glycol (MIRALAX / GLYCOLAX) packet Take 17 g by mouth daily as needed.    . Probiotic Product (PROBIOTIC DAILY PO) Take 1 capsule by mouth daily.     . prochlorperazine (COMPAZINE) 10 MG tablet Take 1 tablet (10 mg total) by mouth every 6 (six) hours as needed for nausea or vomiting. 60 tablet 3  . spironolactone (ALDACTONE) 25 MG tablet Take 25 mg by mouth daily.     No current facility-administered medications for this visit.     PHYSICAL EXAMINATION: ECOG PERFORMANCE STATUS: 2 - Symptomatic, <50% confined to bed  Vitals:   12/07/18 0928  BP: (!) 144/57  Pulse: 71  Resp: 18  Temp: 97.6 F (36.4 C)  SpO2: 100%   Filed Weights   12/07/18 0928  Weight: 126 lb 9.6 oz (57.4 kg)    GENERAL: alert, no distress and comfortable SKIN: skin color, texture, turgor are normal, no rashes or significant lesions EYES: normal, Conjunctiva are pink and non-injected, sclera clear OROPHARYNX: no exudate, no erythema and lips, buccal mucosa, and tongue normal  NECK: supple, thyroid normal size,  non-tender, without nodularity LYMPH: no palpable lymphadenopathy in the cervical, axillary or inguinal LUNGS: clear to auscultation and percussion with normal breathing effort HEART: regular rate & rhythm and no murmurs and no lower extremity edema ABDOMEN: abdomen soft, non-tender and normal bowel sounds MUSCULOSKELETAL: no cyanosis of digits and no clubbing  NEURO: alert & oriented x 3 with fluent speech, no focal motor/sensory deficits EXTREMITIES: No lower extremity edema  LABORATORY DATA:  I have reviewed the data as listed CMP Latest Ref Rng & Units 11/16/2018 10/26/2018 10/05/2018  Glucose 70 - 99 mg/dL 102(H) 97 150(H)  BUN 8 - 23 mg/dL '10 8 10  '$ Creatinine 0.44 - 1.00 mg/dL 0.78 0.81 0.80  Sodium 135 - 145 mmol/L 140 139 138  Potassium 3.5 - 5.1 mmol/L 4.0 4.1 3.7  Chloride 98 - 111 mmol/L 106 105 104  CO2 22 - 32 mmol/L '24 24 25  '$ Calcium 8.9 - 10.3 mg/dL 9.2 9.7 9.6  Total Protein 6.5 - 8.1 g/dL 7.0 7.1 6.6  Total Bilirubin 0.3 - 1.2 mg/dL 0.5 0.6 0.5  Alkaline Phos 38 -  126 U/L 95 81 79  AST 15 - 41 U/L 33 37 29  ALT 0 - 44 U/L '17 23 18    '$ Lab Results  Component Value Date   WBC 2.2 (L) 12/07/2018   HGB 11.6 (L) 12/07/2018   HCT 35.0 (L) 12/07/2018   MCV 98.3 12/07/2018   PLT 77 (L) 12/07/2018   NEUTROABS 1.5 (L) 12/07/2018    ASSESSMENT & PLAN:  Primary cancer of upper outer quadrant of right female breast ([place]) Right breast invasive ductal carcinoma ER positive PR negative HER-2 positive Ki-67 44% multifocal disease 3/7 lymph nodes positive T2 N1 M0 stage IIB status post adjuvant chemotherapy with TCH followed by Herceptin maintenance, adjuvant radiation therapy and Letrozole. MRI 02/14/2015: T5 large destructive lesion with pathologic compression fracture and extensive epidural tumor, involvement of T4, excision metastatic carcinoma ER 60%, PR 0%, HER-2 positive ratio 2.71 average copy #7.45 03/27/2015: T4-6 decompression surgery for spinal cord compression PET-CT  12/10/17Rt para-spinal mass mildy hypermetabolic SUV 5.9, lytic cortical lesion 4.8 cm lesion left prox femur suv 8.8 favor osseus met disease Echo 04/12/2017 shows well preserved ejection fraction of 55-60%  Treatment summary: 1. Resumed anastrozole 05/21/2015, but it has caused significant hair loss so we switched her to exemestane 25 mg once daily 07/23/2015 2. Herceptin every 3 week started 04/10/2015 3. Bone metastases: Zometa every 3 months-next duelate May/early June 4. Radiation therapy to the spine (Dr. [name]) 04/13/2015 to 05/19/2015 5. Radiation therapy to left femur 04/28/2016 to 05/12/2016  6. SRS to the brain lesion 11/24/2016 7.Kadcyla started 05/05/2017 discontinued 07/27/2017 8.Palbociclib +Faslodex +trastuzumab+ Pertuzumabstarted 08/10/2017, stopped 04/04/2018 9. Enhertu started 06/01/18 -------------------------------------------------------------------------------------------------------------------------- PET CT scan 04/03/2018: Multiple new bone lesions including skull, right L2, T12, left distal femur, right distal femur, right acromium. Posterior upper chest wall lesions increase in size and nodularity. MRI brain and thoracic spine on 07/12/2018:No parenchymal metastases. 3 skull metastases 5 to 14 mm stable to slightly increased since December MRI thoracic spine: Enlarged left paracentral T12 vertebral body metastases 20 mm, posterior subcutaneous meds appear regressed.  XRT to T123/04/2019 PET CT scan 10/03/2018:stable, improvement in subcutaneous mets MRI brain 10/12/2018: no intraparenchymal metastases, calvarial metastases stable  Current treatment:Cycle10day 1Enhertu (Fam-trastuzumab-Durextecan) Echo 04/09/2018 shows EF of 55-60%.  Toxicities: 1. Dry eyes/irritation/infection: she will continue to follow with ophthalmology. 2.Severe fatigue-improved with dose reduction 3. Pain: controlled right now with Gabapentin. 4.Hair loss 5. Nausea:  resolved with dose reduction 6. Thrombocytopenia: plt count 77.  Dose reduced with cycle 7.  Patient to return in 3 weeks for labs, f/u, treatment. Scans will be done 12/27/2018    No orders of the defined types were placed in this encounter.  The patient has a good understanding of the overall plan. she agrees with it. she will call with any problems that may develop before the next visit here.  [name], MD 12/07/2018  Julious Oka Dorshimer am acting as scribe for Dr. [name].  I have reviewed the above documentation for accuracy and completeness, and I agree with the above.

## 2018-12-07 ENCOUNTER — Inpatient Hospital Stay: Payer: Medicare Other

## 2018-12-07 ENCOUNTER — Other Ambulatory Visit: Payer: Self-pay

## 2018-12-07 ENCOUNTER — Inpatient Hospital Stay (HOSPITAL_BASED_OUTPATIENT_CLINIC_OR_DEPARTMENT_OTHER): Payer: Medicare Other | Admitting: Hematology and Oncology

## 2018-12-07 DIAGNOSIS — Z79899 Other long term (current) drug therapy: Secondary | ICD-10-CM | POA: Diagnosis not present

## 2018-12-07 DIAGNOSIS — C50411 Malignant neoplasm of upper-outer quadrant of right female breast: Secondary | ICD-10-CM | POA: Diagnosis not present

## 2018-12-07 DIAGNOSIS — Z170 Estrogen receptor positive status [ER+]: Secondary | ICD-10-CM | POA: Diagnosis not present

## 2018-12-07 DIAGNOSIS — D696 Thrombocytopenia, unspecified: Secondary | ICD-10-CM

## 2018-12-07 DIAGNOSIS — Z923 Personal history of irradiation: Secondary | ICD-10-CM | POA: Diagnosis not present

## 2018-12-07 DIAGNOSIS — Z95828 Presence of other vascular implants and grafts: Secondary | ICD-10-CM

## 2018-12-07 DIAGNOSIS — C7951 Secondary malignant neoplasm of bone: Secondary | ICD-10-CM | POA: Diagnosis not present

## 2018-12-07 DIAGNOSIS — Z79811 Long term (current) use of aromatase inhibitors: Secondary | ICD-10-CM

## 2018-12-07 DIAGNOSIS — G9529 Other cord compression: Secondary | ICD-10-CM

## 2018-12-07 DIAGNOSIS — Z5112 Encounter for antineoplastic immunotherapy: Secondary | ICD-10-CM | POA: Diagnosis not present

## 2018-12-07 DIAGNOSIS — C50911 Malignant neoplasm of unspecified site of right female breast: Secondary | ICD-10-CM

## 2018-12-07 LAB — CBC WITH DIFFERENTIAL (CANCER CENTER ONLY)
Abs Immature Granulocytes: 0.01 10*3/uL (ref 0.00–0.07)
Basophils Absolute: 0 10*3/uL (ref 0.0–0.1)
Basophils Relative: 1 %
Eosinophils Absolute: 0.1 10*3/uL (ref 0.0–0.5)
Eosinophils Relative: 3 %
HCT: 35 % — ABNORMAL LOW (ref 36.0–46.0)
Hemoglobin: 11.6 g/dL — ABNORMAL LOW (ref 12.0–15.0)
Immature Granulocytes: 0 %
Lymphocytes Relative: 13 %
Lymphs Abs: 0.3 10*3/uL — ABNORMAL LOW (ref 0.7–4.0)
MCH: 32.6 pg (ref 26.0–34.0)
MCHC: 33.1 g/dL (ref 30.0–36.0)
MCV: 98.3 fL (ref 80.0–100.0)
Monocytes Absolute: 0.3 10*3/uL (ref 0.1–1.0)
Monocytes Relative: 15 %
Neutro Abs: 1.5 10*3/uL — ABNORMAL LOW (ref 1.7–7.7)
Neutrophils Relative %: 68 %
Platelet Count: 77 10*3/uL — ABNORMAL LOW (ref 150–400)
RBC: 3.56 MIL/uL — ABNORMAL LOW (ref 3.87–5.11)
RDW: 15.3 % (ref 11.5–15.5)
WBC Count: 2.2 10*3/uL — ABNORMAL LOW (ref 4.0–10.5)
nRBC: 0 % (ref 0.0–0.2)

## 2018-12-07 LAB — CMP (CANCER CENTER ONLY)
ALT: 20 U/L (ref 0–44)
AST: 35 U/L (ref 15–41)
Albumin: 3.6 g/dL (ref 3.5–5.0)
Alkaline Phosphatase: 98 U/L (ref 38–126)
Anion gap: 8 (ref 5–15)
BUN: 12 mg/dL (ref 8–23)
CO2: 27 mmol/L (ref 22–32)
Calcium: 9.7 mg/dL (ref 8.9–10.3)
Chloride: 106 mmol/L (ref 98–111)
Creatinine: 0.76 mg/dL (ref 0.44–1.00)
GFR, Est AFR Am: >60 mL/min (ref >60)
GFR, Estimated: >60 mL/min (ref >60)
Glucose, Bld: 100 mg/dL — ABNORMAL HIGH (ref 70–99)
Potassium: 4 mmol/L (ref 3.5–5.1)
Sodium: 141 mmol/L (ref 135–145)
Total Bilirubin: 0.6 mg/dL (ref 0.3–1.2)
Total Protein: 7 g/dL (ref 6.5–8.1)

## 2018-12-07 MED ORDER — HEPARIN SOD (PORK) LOCK FLUSH 100 UNIT/ML IV SOLN
500.0000 [IU] | Freq: Once | INTRAVENOUS | Status: AC
  Administered 2018-12-07: 500 [IU]
  Filled 2018-12-07: qty 5

## 2018-12-07 MED ORDER — SODIUM CHLORIDE 0.9% FLUSH
10.0000 mL | Freq: Once | INTRAVENOUS | Status: AC
  Administered 2018-12-07: 10 mL
  Filled 2018-12-07: qty 10

## 2018-12-07 MED ORDER — CYANOCOBALAMIN 1000 MCG/ML IJ SOLN
INTRAMUSCULAR | Status: AC
  Filled 2018-12-07: qty 1

## 2018-12-07 MED ORDER — ACETAMINOPHEN 325 MG PO TABS
650.0000 mg | ORAL_TABLET | Freq: Once | ORAL | Status: AC
  Administered 2018-12-07: 650 mg via ORAL

## 2018-12-07 MED ORDER — PALONOSETRON HCL INJECTION 0.25 MG/5ML
INTRAVENOUS | Status: AC
  Filled 2018-12-07: qty 5

## 2018-12-07 MED ORDER — DEXTROSE 5 % IV SOLN
Freq: Once | INTRAVENOUS | Status: AC
  Administered 2018-12-07: 10:00:00 via INTRAVENOUS
  Filled 2018-12-07: qty 250

## 2018-12-07 MED ORDER — CYANOCOBALAMIN 1000 MCG/ML IJ SOLN
1000.0000 ug | Freq: Once | INTRAMUSCULAR | Status: AC
  Administered 2018-12-07: 1000 ug via INTRAMUSCULAR

## 2018-12-07 MED ORDER — ACETAMINOPHEN 325 MG PO TABS
ORAL_TABLET | ORAL | Status: AC
  Filled 2018-12-07: qty 2

## 2018-12-07 MED ORDER — PALONOSETRON HCL INJECTION 0.25 MG/5ML
0.2500 mg | Freq: Once | INTRAVENOUS | Status: AC
  Administered 2018-12-07: 10:00:00 0.25 mg via INTRAVENOUS

## 2018-12-07 MED ORDER — FAM-TRASTUZUMAB DERUXTECAN-NXKI CHEMO 100 MG IV SOLR
200.0000 mg | Freq: Once | INTRAVENOUS | Status: AC
  Administered 2018-12-07: 11:00:00 200 mg via INTRAVENOUS
  Filled 2018-12-07: qty 10

## 2018-12-07 NOTE — Assessment & Plan Note (Signed)
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 MRI 02/14/2015: T5 large destructive lesion with pathologic compression fracture and extensive epidural tumor, involvement of T4, excision metastatic carcinoma ER 60%, PR 0%, HER-2 positive ratio 2.71 average copy #7.45 03/27/2015: T4-6 decompression surgery for spinal cord compression PET-CT 12/10/17Rt para-spinal mass mildy hypermetabolic SUV 5.9, lytic cortical lesion 4.8 cm lesion left prox femur suv 8.8 favor osseus met disease Echo 04/12/2017 shows well preserved ejection fraction of 55-60%  Treatment summary: 1. Resumed anastrozole 05/21/2015, but it has caused significant hair loss so we switched her to exemestane 25 mg once daily 07/23/2015 2. Herceptin every 3 week started 04/10/2015 3. Bone metastases: Zometa every 3 months-next duelate May/early June 4. Radiation therapy to the spine (Dr. [name]) 04/13/2015 to 05/19/2015 5. Radiation therapy to left femur 04/28/2016 to 05/12/2016  6. SRS to the brain lesion 11/24/2016 7.Kadcyla started 05/05/2017 discontinued 07/27/2017 8.Palbociclib +Faslodex +trastuzumab+ Pertuzumabstarted 08/10/2017, stopped 04/04/2018 9. Enhertu started 06/01/18 -------------------------------------------------------------------------------------------------------------------------- PET CT scan 04/03/2018: Multiple new bone lesions including skull, right L2, T12, left distal femur, right distal femur, right acromium. Posterior upper chest wall lesions increase in size and nodularity. MRI brain and thoracic spine on 07/12/2018:No parenchymal metastases. 3 skull metastases 5 to 14 mm stable to slightly increased since December MRI thoracic spine: Enlarged left paracentral T12 vertebral body  metastases 20 mm, posterior subcutaneous meds appear regressed.  XRT to T123/04/2019 PET CT scan 10/03/2018:stable, improvement in subcutaneous mets MRI brain 10/12/2018: no intraparenchymal metastases, calvarial metastases stable  Current treatment:Cycle10day 1Enhertu (Fam-trastuzumab-Durextecan) Echo 04/09/2018 shows EF of 55-60%.  Toxicities: 1. Dry eyes/irritation/infection: she will continue to follow with ophthalmology. 2.Severe fatigue-improved with dose reduction 3. Pain: controlled right now with Gabapentin. 4.Hair loss 5. Nausea: resolved with dose reduction 6. Thrombocytopenia: plt count 77.  Dose reduced with cycle 7.  Patient to return in 3 weeks for labs, f/u, treatment. Scans will be done 12/27/2018

## 2018-12-07 NOTE — Patient Instructions (Signed)
[facility] Discharge Instructions for Patients Receiving Chemotherapy  Today you received the following chemotherapy agents: ENHERTU.  To help prevent nausea and vomiting after your treatment, we encourage you to take your nausea medication as directed.   If you develop nausea and vomiting that is not controlled by your nausea medication, call the clinic.   BELOW ARE SYMPTOMS THAT SHOULD BE REPORTED IMMEDIATELY:  *FEVER GREATER THAN 100.5 F  *CHILLS WITH OR WITHOUT FEVER  NAUSEA AND VOMITING THAT IS NOT CONTROLLED WITH YOUR NAUSEA MEDICATION  *UNUSUAL SHORTNESS OF BREATH  *UNUSUAL BRUISING OR BLEEDING  TENDERNESS IN MOUTH AND THROAT WITH OR WITHOUT PRESENCE OF ULCERS  *URINARY PROBLEMS  *BOWEL PROBLEMS  UNUSUAL RASH Items with * indicate a potential emergency and should be followed up as soon as possible.  Feel free to call the clinic should you have any questions or concerns. The clinic phone number is [phone].  Please show the [facility] at check-in to the Emergency Department and triage nurse.  Cyanocobalamin, Vitamin B12 injection What is this medicine? CYANOCOBALAMIN (sye an oh koe BAL a min) is a man made form of vitamin B12. Vitamin B12 is used in the growth of healthy blood cells, nerve cells, and proteins in the body. It also helps with the metabolism of fats and carbohydrates. This medicine is used to treat people who can not absorb vitamin B12. This medicine may be used for other purposes; ask your health care provider or pharmacist if you have questions. COMMON BRAND NAME(S): B-12 Compliance Kit, B-12 Injection Kit, Cyomin, LA-12, Nutri-Twelve, Physicians EZ Use B-12, Primabalt What should I tell my health care provider before I take this medicine? They need to know if you have any of these conditions:  kidney disease  Leber's disease  megaloblastic anemia  an unusual or allergic reaction to cyanocobalamin, cobalt, other  medicines, foods, dyes, or preservatives  pregnant or trying to get pregnant  breast-feeding How should I use this medicine? This medicine is injected into a muscle or deeply under the skin. It is usually given by a health care professional in a clinic or doctor's office. However, your doctor may teach you how to inject yourself. Follow all instructions. Talk to your pediatrician regarding the use of this medicine in children. Special care may be needed. Overdosage: If you think you have taken too much of this medicine contact a poison control center or emergency room at once. NOTE: This medicine is only for you. Do not share this medicine with others. What if I miss a dose? If you are given your dose at a clinic or doctor's office, call to reschedule your appointment. If you give your own injections and you miss a dose, take it as soon as you can. If it is almost time for your next dose, take only that dose. Do not take double or extra doses. What may interact with this medicine?  colchicine  heavy alcohol intake This list may not describe all possible interactions. Give your health care provider a list of all the medicines, herbs, non-prescription drugs, or dietary supplements you use. Also tell them if you smoke, drink alcohol, or use illegal drugs. Some items may interact with your medicine. What should I watch for while using this medicine? Visit your doctor or health care professional regularly. You may need blood work done while you are taking this medicine. You may need to follow a special diet. Talk to your doctor. Limit your alcohol intake and  avoid smoking to get the best benefit. What side effects may I notice from receiving this medicine? Side effects that you should report to your doctor or health care professional as soon as possible:  allergic reactions like skin rash, itching or hives, swelling of the face, lips, or tongue  blue tint to skin  chest tightness,  pain  difficulty breathing, wheezing  dizziness  red, swollen painful area on the leg Side effects that usually do not require medical attention (report to your doctor or health care professional if they continue or are bothersome):  diarrhea  headache This list may not describe all possible side effects. Call your doctor for medical advice about side effects. You may report side effects to FDA at 1-800-FDA-1088. Where should I keep my medicine? Keep out of the reach of children. Store at room temperature between 15 and 30 degrees C (59 and 85 degrees F). Protect from light. Throw away any unused medicine after the expiration date. NOTE: This sheet is a summary. It may not cover all possible information. If you have questions about this medicine, talk to your doctor, pharmacist, or health care provider.  2020 Elsevier/Gold Standard (2007-08-06 22:10:20)

## 2018-12-12 DIAGNOSIS — Z170 Estrogen receptor positive status [ER+]: Secondary | ICD-10-CM | POA: Diagnosis not present

## 2018-12-12 DIAGNOSIS — C50411 Malignant neoplasm of upper-outer quadrant of right female breast: Secondary | ICD-10-CM | POA: Diagnosis not present

## 2018-12-12 DIAGNOSIS — R531 Weakness: Secondary | ICD-10-CM | POA: Diagnosis not present

## 2018-12-12 DIAGNOSIS — C7951 Secondary malignant neoplasm of bone: Secondary | ICD-10-CM | POA: Diagnosis not present

## 2018-12-19 DIAGNOSIS — C50411 Malignant neoplasm of upper-outer quadrant of right female breast: Secondary | ICD-10-CM | POA: Diagnosis not present

## 2018-12-19 DIAGNOSIS — C7951 Secondary malignant neoplasm of bone: Secondary | ICD-10-CM | POA: Diagnosis not present

## 2018-12-19 DIAGNOSIS — R531 Weakness: Secondary | ICD-10-CM | POA: Diagnosis not present

## 2018-12-19 DIAGNOSIS — Z170 Estrogen receptor positive status [ER+]: Secondary | ICD-10-CM | POA: Diagnosis not present

## 2018-12-21 NOTE — Assessment & Plan Note (Addendum)
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 MRI 02/14/2015: T5 large destructive lesion with pathologic compression fracture and extensive epidural tumor, involvement of T4, excision metastatic carcinoma ER 60%, PR 0%, HER-2 positive ratio 2.71 average copy #7.45 03/27/2015: T4-6 decompression surgery for spinal cord compression PET-CT 12/10/17Rt para-spinal mass mildy hypermetabolic SUV 5.9, lytic cortical lesion 4.8 cm lesion left prox femur suv 8.8 favor osseus met disease Echo 04/12/2017 shows well preserved ejection fraction of 55-60%  Treatment summary: 1. Resumed anastrozole 05/21/2015, but it has caused significant hair loss so we switched her to exemestane 25 mg once daily 07/23/2015 2. Herceptin every 3 week started 04/10/2015 3. Bone metastases: Zometa every 3 months 4. Radiation therapy to the spine (Dr. [name]) 04/13/2015 to 05/19/2015 5. Radiation therapy to left femur 04/28/2016 to 05/12/2016  6. SRS to the brain lesion 11/24/2016 7.Kadcyla started 05/05/2017 discontinued 07/27/2017 8.Palbociclib +Faslodex +trastuzumab+ Pertuzumabstarted 08/10/2017, stopped 04/04/2018 9. Enhertu started 06/01/18 -------------------------------------------------------------------------------------------------------------------------- PET CT scan 04/03/2018: Multiple new bone lesions including skull, right L2, T12, left distal femur, right distal femur, right acromium. Posterior upper chest wall lesions increase in size and nodularity. MRI brain and thoracic spine on 07/12/2018:No parenchymal metastases. 3 skull metastases 5 to 14 mm stable to slightly increased since December MRI thoracic spine: Enlarged left paracentral T12 vertebral body metastases 20 mm, posterior  subcutaneous meds appear regressed.  XRT to T123/04/2019 PET CT scan 10/03/2018:stable, improvement in subcutaneous mets MRI brain 10/12/2018: no intraparenchymal metastases, calvarial metastases stable  Current treatment:Cycle11day 1Enhertu (Fam-trastuzumab-Durextecan) Echo 04/09/2018 shows EF of 55-60%.  Toxicities: 1. Dry eyes/irritation/infection: she will continue to follow with ophthalmology. 2.Severe fatigue-improved with dose reduction 3. Pain: controlled right now with Gabapentin. 4.Hair loss 5. Nausea: resolved with dose reduction 6. Thrombocytopenia: plt count 77.Dose reduced with cycle 7.  Patient to return in 3 weeks for labs, f/u, treatment. Scans will be done 12/27/2018

## 2018-12-27 ENCOUNTER — Ambulatory Visit (HOSPITAL_COMMUNITY)
Admission: RE | Admit: 2018-12-27 | Discharge: 2018-12-27 | Disposition: A | Discharge status: Home / Self Care | Payer: Medicare Other | Source: Ambulatory Visit | Attending: Hematology and Oncology | Admitting: Hematology and Oncology

## 2018-12-27 ENCOUNTER — Other Ambulatory Visit: Payer: Self-pay

## 2018-12-27 ENCOUNTER — Encounter (HOSPITAL_COMMUNITY): Payer: Self-pay

## 2018-12-27 DIAGNOSIS — C50411 Malignant neoplasm of upper-outer quadrant of right female breast: Secondary | ICD-10-CM | POA: Insufficient documentation

## 2018-12-27 DIAGNOSIS — D1801 Hemangioma of skin and subcutaneous tissue: Secondary | ICD-10-CM | POA: Diagnosis not present

## 2018-12-27 DIAGNOSIS — L821 Other seborrheic keratosis: Secondary | ICD-10-CM | POA: Diagnosis not present

## 2018-12-27 DIAGNOSIS — L820 Inflamed seborrheic keratosis: Secondary | ICD-10-CM | POA: Diagnosis not present

## 2018-12-27 DIAGNOSIS — C7800 Secondary malignant neoplasm of unspecified lung: Secondary | ICD-10-CM | POA: Diagnosis not present

## 2018-12-27 DIAGNOSIS — C50919 Malignant neoplasm of unspecified site of unspecified female breast: Secondary | ICD-10-CM | POA: Diagnosis not present

## 2018-12-27 DIAGNOSIS — C7951 Secondary malignant neoplasm of bone: Secondary | ICD-10-CM | POA: Diagnosis not present

## 2018-12-27 DIAGNOSIS — D225 Melanocytic nevi of trunk: Secondary | ICD-10-CM | POA: Diagnosis not present

## 2018-12-27 MED ORDER — IOHEXOL 300 MG/ML  SOLN
100.0000 mL | Freq: Once | INTRAMUSCULAR | Status: AC | PRN
  Administered 2018-12-27: 100 mL via INTRAVENOUS

## 2018-12-27 MED ORDER — SODIUM CHLORIDE (PF) 0.9 % IJ SOLN
INTRAMUSCULAR | Status: AC
  Filled 2018-12-27: qty 50

## 2018-12-27 NOTE — Progress Notes (Signed)
Patient Care Team: [name], MD as PCP - General (Family Medicine)  DIAGNOSIS:    ICD-10-CM   1. Breast cancer metastasized to bone, right ([place])  C50.911    C79.51     SUMMARY OF ONCOLOGIC HISTORY: Oncology History  Primary cancer of upper outer quadrant of right female breast ([place])  03/08/2012 Initial Diagnosis   Right breast invasive ductal carcinoma ER positive PR negative HER-2 positive Ki-67 44%; another breast mass biopsied in the anterior part of the breast which was also positive for malignancy that was HER-2 negative   03/14/2012 Cancer Staging   Staging form: Breast, AJCC 7th Edition - Pathologic: Stage IV (M1) - Signed by [name], NP on 06/22/2018   05/10/2012 Surgery   Right mastectomy and axillary lymph node dissection: Multifocal disease 5 cm, 1.7 cm, 1.6 cm, ER positive PR negative HER-2 positive Ki-67 44%, 3/7 lymph nodes positive   06/14/2012 - 06/12/2013 Chemotherapy   Adjuvant chemotherapy with [place] 6 followed by Herceptin maintenance   10/25/2012 - 12/11/2012 Radiation Therapy   Adjuvant radiation therapy   02/07/2013 -  Anti-estrogen oral therapy   Letrozole 2.5 mg daily   02/14/2015 Imaging   MRI spine: Large destructive T5 lesion with severe pathologic compression fracture and extensive epidural tumor, severe spinal stenosis with moderate cord compression, tumor involvement of T4   03/18/2015 PET scan   Residual enhancing soft tissue adjacent to the spinal cord. No evidence of metastatic disease. Nonspecific uptake the left nipple   03/27/2015 - 03/30/2015 Hospital Admission   T4-6 decompression for spinal cord compression (lower extremity paralysis)   04/10/2015 -  Chemotherapy   Palliative treatment with Herceptin every 3 weeks with letrozole 2.5 mg daily   04/13/2015 - 05/19/2015 Radiation Therapy   Palliative radiation treatment to the spine   09/01/2015 Imaging   CT chest abdomen pelvis: Pathologic fracture with posterior fusion at  T5 no other evidence of metastatic disease in the chest abdomen pelvis   12/23/2015 Imaging   CT CAP: new nonspecific 0.6 cm lymph node was stated mediastinum needs follow-up CT, innumerable tiny groundglass pulmonary nodules throughout both lungs unchanged, patchy consolidation from radiation, T5 fracture, no mets   04/15/2016 PET scan   Rt para-spinal mass mildy hypermetabolic SUV 5.9, lytic cortical lesion 4.8 cm lesion left prox femur suv 8.8 favor osseus met disease   04/28/2016 - 05/12/2016 Radiation Therapy   Palliative XRT Left femur   05/24/2016 Procedure   Soft tissue mass biopsy back: Metastatic breast cancer ER 80%, PR 0%, Ki-67 50%, HER-2 positive ratio 2.25   06/09/2016 - 03/29/2017 Anti-estrogen oral therapy   Faslodex with Herceptin and Perjeta every 4 weeks   06/13/2016 - 06/15/2016 Hospital Admission   uncomplicated revision of previous thoracic instrumentation.   10/10/2016 PET scan   Interval development of new hypermetabolic 2.5 x 1.1 cm lesion posterior right eighth rib consistent with metastatic disease, stable right paraspinal lesion at T8, clustered soft tissue nodules in the posterior midline back near cervicothoracic junction smaller than before    11/03/2016 - 11/07/2016 Radiation Therapy   Palliative radiation to right eighth rib   11/04/2016 Imaging   Solitary contrast-enhancing lesion in the right cerebellum 6 x 2 mm concerning for metastatic lesion   11/24/2016 - 11/24/2016 Radiation Therapy   SRS to the brain lesion   04/18/2017 PET scan   New ill-defined rounded airspace opacity in posterior right lower lobe. Differential diagnosis includes malignancy and infectious/inflammatory process.Increased hypermetabolic lytic lesion  in T8 vertebral body, consistent with bone metastasis.  Increased hypermetabolic posterior upper chest wall subcutaneous nodules, highly suspicious metastatic disease. Further improvement in right posterior eighth rib metastasis.   05/04/2017 -  07/27/2017 Chemotherapy   Kadcyla every 3 weeks palliative chemotherapy stopped for progression   07/26/2017 Imaging   Interval increase in size of the multiple enhancing masses posteriorly overlying the spine, interval development of left axillary and mediastinal adenopathy, increase in the size of lytic lesions in T8-T9 and left ninth rib, increased groundglass opacities right lower lobe lung   07/27/2017 - 05/11/2018 Chemotherapy   Palbociclib + Faslodex + trastuzumab + Pertuzumab     04/03/2018 PET scan   PET CT scan showed progression of disease not only in the bones but also in the subcutaneous nodules.   05/15/2018 - 05/25/2018 Radiation Therapy   Palliative radiation to the back   06/01/2018 -  Chemotherapy   Enhertu (Transtuzumab Deruxtecan)    08/07/2018 - 08/07/2018 Radiation Therapy   T12 vertebral body SRS   Breast cancer metastasized to bone, right ([place])  02/14/2015 Initial Diagnosis   Breast cancer metastasized to bone, right ([place])   06/01/2018 -  Chemotherapy   The patient had palonosetron (ALOXI) injection 0.25 mg, 0.25 mg, Intravenous,  Once, 10 of 10 cycles Administration: 0.25 mg (06/01/2018), 0.25 mg (06/22/2018), 0.25 mg (08/24/2018), 0.25 mg (07/13/2018), 0.25 mg (08/03/2018), 0.25 mg (09/14/2018), 0.25 mg (10/05/2018), 0.25 mg (11/16/2018), 0.25 mg (10/26/2018), 0.25 mg (12/07/2018) fam-trastuzumab deruxtecan-nxki (ENHERTU) 332 mg in dextrose 5 % 100 mL chemo infusion, 5.4 mg/kg = 332 mg, Intravenous,  Once, 10 of 10 cycles Dose modification: 4.4 mg/kg (original dose 5.4 mg/kg, Cycle 7, Reason: Provider Judgment), 3.2 mg/kg (original dose 5.4 mg/kg, Cycle 9, Reason: Dose not tolerated) Administration: 332 mg (06/01/2018), 332 mg (06/22/2018), 332 mg (07/13/2018), 332 mg (08/24/2018), 332 mg (08/03/2018), 332 mg (09/14/2018), 272 mg (10/05/2018), 200 mg (11/16/2018), 272 mg (10/26/2018), 200 mg (12/07/2018)  for chemotherapy treatment.      CHIEF COMPLIANT: Follow-up of metastatic breast  cancer on Enhertu to review recent scans   INTERVAL HISTORY: Heather [name] is a 73 y.o. with above-mentioned history of metastatic breast cancercurrently onchemotherapy with trastuzumab-Deruxtecan (Enhertu).CT CAP on 12/27/18 showed no new or progressive findings and stable osseous metastases with no new lesions. She presents to the clinic todayfor cycle 11 and to review her recent scans.   REVIEW OF SYSTEMS:   Constitutional: Denies fevers, chills or abnormal weight loss Eyes: Denies blurriness of vision Ears, nose, mouth, throat, and face: Denies mucositis or sore throat Respiratory: Denies cough, dyspnea or wheezes Cardiovascular: Denies palpitation, chest discomfort Gastrointestinal: Denies nausea, heartburn or change in bowel habits Skin: Denies abnormal skin rashes Lymphatics: Denies new lymphadenopathy or easy bruising Neurological: Denies numbness, tingling or new weaknesses Behavioral/Psych: Mood is stable, no new changes  Extremities: No lower extremity edema Breast: denies any pain or lumps or nodules in either breasts All other systems were reviewed with the patient and are negative.  I have reviewed the past medical history, past surgical history, social history and family history with the patient and they are unchanged from previous note.  ALLERGIES:  is allergic to other; acyclovir and related; chlorhexidine; zanaflex [tizanidine hcl]; codeine; keflex [cephalexin]; naprosyn [naproxen]; oxycodone; tramadol; and tussin [guaifenesin].  MEDICATIONS:  Current Outpatient Medications  Medication Sig Dispense Refill  . ALPRAZolam (XANAX) 0.25 MG tablet Take 1 tablet by mouth as needed.    . Calcium Carbonate Antacid (TUMS PO) Take  2 tablets by mouth 2 (two) times daily as needed (acid reflux).    . Cholecalciferol (VITAMIN D) 2000 UNITS tablet Take 2,000 Units by mouth daily.    . cycloSPORINE (RESTASIS) 0.05 % ophthalmic emulsion 1 drop 2 (two) times daily.    Marland Kitchen gabapentin  (NEURONTIN) 600 MG tablet Take 1 tablet (600 mg total) by mouth as directed. Take 1 tablet in the AM and afternoon and 1 1/2 tabs at bedtime (Patient taking differently: Take 600 mg by mouth 3 (three) times daily. Take 1 tablet in the AM and afternoon and 1 1/2 tabs at bedtime) 315 tablet 2  . HYDROcodone-acetaminophen (NORCO/VICODIN) 5-325 MG tablet Take 1 tablet by mouth every 6 (six) hours as needed for moderate pain. 30 tablet 0  . lidocaine-prilocaine (EMLA) cream Apply to affected area once (Patient taking differently: Apply 1 application topically every 30 (thirty) days. Apply to port prior to infusions) 30 g 3  . omeprazole (PRILOSEC) 40 MG capsule TAKE 1 CAPSULE BY MOUTH ONCE DAILY FOR 30 DAYS  5  . ondansetron (ZOFRAN) 8 MG tablet Take 1 tablet (8 mg total) by mouth every 8 (eight) hours as needed for nausea or vomiting. 20 tablet 0  . OVER THE COUNTER MEDICATION Place 1 drop into both eyes 2 (two) times daily as needed (dry eyes). Over the counter lubricating eye drops    . polyethylene glycol (MIRALAX / GLYCOLAX) packet Take 17 g by mouth daily as needed.    . Probiotic Product (PROBIOTIC DAILY PO) Take 1 capsule by mouth daily.     . prochlorperazine (COMPAZINE) 10 MG tablet Take 1 tablet (10 mg total) by mouth every 6 (six) hours as needed for nausea or vomiting. 60 tablet 3  . spironolactone (ALDACTONE) 25 MG tablet Take 25 mg by mouth daily.     No current facility-administered medications for this visit.     PHYSICAL EXAMINATION: ECOG PERFORMANCE STATUS: 1 - Symptomatic but completely ambulatory  Vitals:   12/28/18 0902  BP: (!) 142/58  Pulse: 73  Resp: 18  Temp: 98.5 F (36.9 C)  SpO2: 100%   Filed Weights   12/28/18 0902  Weight: 126 lb 3.2 oz (57.2 kg)    GENERAL: alert, no distress and comfortable SKIN: skin color, texture, turgor are normal, no rashes or significant lesions EYES: normal, Conjunctiva are pink and non-injected, sclera clear OROPHARYNX: no  exudate, no erythema and lips, buccal mucosa, and tongue normal  NECK: supple, thyroid normal size, non-tender, without nodularity LYMPH: no palpable lymphadenopathy in the cervical, axillary or inguinal LUNGS: clear to auscultation and percussion with normal breathing effort HEART: regular rate & rhythm and no murmurs and no lower extremity edema ABDOMEN: abdomen soft, non-tender and normal bowel sounds MUSCULOSKELETAL: no cyanosis of digits and no clubbing  NEURO: alert & oriented x 3 with fluent speech, no focal motor/sensory deficits EXTREMITIES: No lower extremity edema  LABORATORY DATA:  I have reviewed the data as listed CMP Latest Ref Rng & Units 12/07/2018 11/16/2018 10/26/2018  Glucose 70 - 99 mg/dL 100(H) 102(H) 97  BUN 8 - 23 mg/dL _0 Creatinine 0.44 - 1.00 mg/dL 0.76 0.78 0.81  Sodium 135 - 145 mmol/L 141 140 139  Potassium 3.5 - 5.1 mmol/L 4.0 4.0 4.1  Chloride 98 - 111 mmol/L 106 106 105  CO2 22 - 32 mmol/L _1 Calcium 8.9 - 10.3 mg/dL 9.7 9.2 9.7  Total Protein 6.5 - 8.1 g/dL 7.0 7.0 7.1  Total Bilirubin 0.3 - 1.2 mg/dL 0.6 0.5 0.6  Alkaline Phos 38 - 126 U/L 98 95 81  AST 15 - 41 U/L 35 33 37  ALT 0 - 44 U/L _0 Lab Results  Component Value Date   WBC 2.5 (L) 12/28/2018   HGB 11.6 (L) 12/28/2018   HCT 35.3 (L) 12/28/2018   MCV 97.2 12/28/2018   PLT 74 (L) 12/28/2018   NEUTROABS 1.8 12/28/2018    ASSESSMENT & PLAN:  Breast cancer metastasized to bone, right ([facility]) Right breast invasive ductal carcinoma ER positive PR negative HER-2 positive Ki-67 44% multifocal disease 3/7 lymph nodes positive T2 N1 M0 stage IIB status post adjuvant chemotherapy with TCH followed by Herceptin maintenance, adjuvant radiation therapy and Letrozole. MRI 02/14/2015: T5 large destructive lesion with pathologic compression fracture and extensive epidural tumor, involvement of T4, excision metastatic carcinoma ER 60%, PR 0%, HER-2 positive ratio 2.71 average copy  #7.45 03/27/2015: T4-6 decompression surgery for spinal cord compression PET-CT 12/10/17Rt para-spinal mass mildy hypermetabolic SUV 5.9, lytic cortical lesion 4.8 cm lesion left prox femur suv 8.8 favor osseus met disease Echo 04/12/2017 shows well preserved ejection fraction of 55-60%  Treatment summary: 1. Resumed anastrozole 05/21/2015, but it has caused significant hair loss so we switched her to exemestane 25 mg once daily 07/23/2015 2. Herceptin every 3 week started 04/10/2015 3. Bone metastases: Zometa every 3 months 4. Radiation therapy to the spine (Dr. [name]) 04/13/2015 to 05/19/2015 5. Radiation therapy to left femur 04/28/2016 to 05/12/2016  6. SRS to the brain lesion 11/24/2016 7.Kadcyla started 05/05/2017 discontinued 07/27/2017 8.Palbociclib +Faslodex +trastuzumab+ Pertuzumabstarted 08/10/2017, stopped 04/04/2018 9. Enhertu started 06/01/18 -------------------------------------------------------------------------------------------------------------------------- PET CT scan 04/03/2018: Multiple new bone lesions including skull, right L2, T12, left distal femur, right distal femur, right acromium. Posterior upper chest wall lesions increase in size and nodularity. MRI brain and thoracic spine on 07/12/2018:No parenchymal metastases. 3 skull metastases 5 to 14 mm stable to slightly increased since December MRI thoracic spine: Enlarged left paracentral T12 vertebral body metastases 20 mm, posterior subcutaneous meds appear regressed.  XRT to T123/04/2019 PET CT scan 10/03/2018:stable, improvement in subcutaneous mets MRI brain 10/12/2018: no intraparenchymal metastases, calvarial metastases stable  Current treatment:Cycle11day 1Enhertu (Fam-trastuzumab-Durextecan) Echo 04/09/2018 shows EF of 55-60%.  Toxicities: 1. Severe fatigue-improved with dose reduction 2. Pain: controlled right now with Gabapentin. 3.Hair loss 4. Thrombocytopenia: plt count 74.Dose  reduced with cycle 7.  Patient to return in 3 weeks for labs, f/u, treatment. After that we will switch her to every 4 weeks  Scans  12/27/2018: Stable bone disease. Brain MRI to be done 01/17/2019.     No orders of the defined types were placed in this encounter.  The patient has a good understanding of the overall plan. she agrees with it. she will call with any problems that may develop before the next visit here.  [name], MD 12/28/2018  Julious Oka Dorshimer am acting as scribe for Dr. [name].  I have reviewed the above documentation for accuracy and completeness, and I agree with the above.

## 2018-12-28 ENCOUNTER — Other Ambulatory Visit: Payer: Self-pay

## 2018-12-28 ENCOUNTER — Inpatient Hospital Stay: Payer: Medicare Other

## 2018-12-28 ENCOUNTER — Inpatient Hospital Stay: Payer: Medicare Other | Attending: Hematology and Oncology

## 2018-12-28 ENCOUNTER — Other Ambulatory Visit: Payer: Self-pay | Admitting: Hematology and Oncology

## 2018-12-28 ENCOUNTER — Inpatient Hospital Stay (HOSPITAL_BASED_OUTPATIENT_CLINIC_OR_DEPARTMENT_OTHER): Payer: Medicare Other | Admitting: Hematology and Oncology

## 2018-12-28 DIAGNOSIS — Z923 Personal history of irradiation: Secondary | ICD-10-CM | POA: Diagnosis not present

## 2018-12-28 DIAGNOSIS — D696 Thrombocytopenia, unspecified: Secondary | ICD-10-CM | POA: Insufficient documentation

## 2018-12-28 DIAGNOSIS — C50911 Malignant neoplasm of unspecified site of right female breast: Secondary | ICD-10-CM

## 2018-12-28 DIAGNOSIS — Z79811 Long term (current) use of aromatase inhibitors: Secondary | ICD-10-CM | POA: Insufficient documentation

## 2018-12-28 DIAGNOSIS — Z9221 Personal history of antineoplastic chemotherapy: Secondary | ICD-10-CM | POA: Insufficient documentation

## 2018-12-28 DIAGNOSIS — C7951 Secondary malignant neoplasm of bone: Secondary | ICD-10-CM | POA: Insufficient documentation

## 2018-12-28 DIAGNOSIS — C50411 Malignant neoplasm of upper-outer quadrant of right female breast: Secondary | ICD-10-CM | POA: Diagnosis not present

## 2018-12-28 DIAGNOSIS — Z5112 Encounter for antineoplastic immunotherapy: Secondary | ICD-10-CM | POA: Insufficient documentation

## 2018-12-28 DIAGNOSIS — R5383 Other fatigue: Secondary | ICD-10-CM | POA: Diagnosis not present

## 2018-12-28 DIAGNOSIS — Z170 Estrogen receptor positive status [ER+]: Secondary | ICD-10-CM | POA: Diagnosis not present

## 2018-12-28 DIAGNOSIS — Z79899 Other long term (current) drug therapy: Secondary | ICD-10-CM | POA: Diagnosis not present

## 2018-12-28 DIAGNOSIS — Z95828 Presence of other vascular implants and grafts: Secondary | ICD-10-CM

## 2018-12-28 DIAGNOSIS — G9529 Other cord compression: Secondary | ICD-10-CM

## 2018-12-28 LAB — CMP (CANCER CENTER ONLY)
ALT: 20 U/L (ref 0–44)
AST: 35 U/L (ref 15–41)
Albumin: 3.6 g/dL (ref 3.5–5.0)
Alkaline Phosphatase: 111 U/L (ref 38–126)
Anion gap: 10 (ref 5–15)
BUN: 10 mg/dL (ref 8–23)
CO2: 23 mmol/L (ref 22–32)
Calcium: 9.3 mg/dL (ref 8.9–10.3)
Chloride: 106 mmol/L (ref 98–111)
Creatinine: 0.97 mg/dL (ref 0.44–1.00)
GFR, Est AFR Am: >60 mL/min (ref >60)
GFR, Estimated: 58 mL/min — ABNORMAL LOW (ref >60)
Glucose, Bld: 119 mg/dL — ABNORMAL HIGH (ref 70–99)
Potassium: 3.6 mmol/L (ref 3.5–5.1)
Sodium: 139 mmol/L (ref 135–145)
Total Bilirubin: 0.6 mg/dL (ref 0.3–1.2)
Total Protein: 7 g/dL (ref 6.5–8.1)

## 2018-12-28 LAB — CBC WITH DIFFERENTIAL (CANCER CENTER ONLY)
Abs Immature Granulocytes: 0 10*3/uL (ref 0.00–0.07)
Basophils Absolute: 0 10*3/uL (ref 0.0–0.1)
Basophils Relative: 1 %
Eosinophils Absolute: 0.1 10*3/uL (ref 0.0–0.5)
Eosinophils Relative: 5 %
HCT: 35.3 % — ABNORMAL LOW (ref 36.0–46.0)
Hemoglobin: 11.6 g/dL — ABNORMAL LOW (ref 12.0–15.0)
Immature Granulocytes: 0 %
Lymphocytes Relative: 11 %
Lymphs Abs: 0.3 10*3/uL — ABNORMAL LOW (ref 0.7–4.0)
MCH: 32 pg (ref 26.0–34.0)
MCHC: 32.9 g/dL (ref 30.0–36.0)
MCV: 97.2 fL (ref 80.0–100.0)
Monocytes Absolute: 0.3 10*3/uL (ref 0.1–1.0)
Monocytes Relative: 13 %
Neutro Abs: 1.8 10*3/uL (ref 1.7–7.7)
Neutrophils Relative %: 70 %
Platelet Count: 74 10*3/uL — ABNORMAL LOW (ref 150–400)
RBC: 3.63 MIL/uL — ABNORMAL LOW (ref 3.87–5.11)
RDW: 14.8 % (ref 11.5–15.5)
WBC Count: 2.5 10*3/uL — ABNORMAL LOW (ref 4.0–10.5)
nRBC: 0 % (ref 0.0–0.2)

## 2018-12-28 MED ORDER — ACETAMINOPHEN 325 MG PO TABS
ORAL_TABLET | ORAL | Status: AC
  Filled 2018-12-28: qty 2

## 2018-12-28 MED ORDER — PALONOSETRON HCL INJECTION 0.25 MG/5ML
INTRAVENOUS | Status: AC
  Filled 2018-12-28: qty 5

## 2018-12-28 MED ORDER — FAM-TRASTUZUMAB DERUXTECAN-NXKI CHEMO 100 MG IV SOLR
200.0000 mg | Freq: Once | INTRAVENOUS | Status: AC
  Administered 2018-12-28: 200 mg via INTRAVENOUS
  Filled 2018-12-28: qty 10

## 2018-12-28 MED ORDER — PALONOSETRON HCL INJECTION 0.25 MG/5ML
0.2500 mg | Freq: Once | INTRAVENOUS | Status: AC
  Administered 2018-12-28: 0.25 mg via INTRAVENOUS

## 2018-12-28 MED ORDER — HEPARIN SOD (PORK) LOCK FLUSH 100 UNIT/ML IV SOLN
500.0000 [IU] | Freq: Once | INTRAVENOUS | Status: AC | PRN
  Administered 2018-12-28: 500 [IU]
  Filled 2018-12-28: qty 5

## 2018-12-28 MED ORDER — ACETAMINOPHEN 325 MG PO TABS
650.0000 mg | ORAL_TABLET | Freq: Once | ORAL | Status: AC
  Administered 2018-12-28: 650 mg via ORAL

## 2018-12-28 MED ORDER — SODIUM CHLORIDE 0.9% FLUSH
10.0000 mL | Freq: Once | INTRAVENOUS | Status: AC
  Administered 2018-12-28: 10 mL
  Filled 2018-12-28: qty 10

## 2018-12-28 MED ORDER — SODIUM CHLORIDE 0.9% FLUSH
10.0000 mL | INTRAVENOUS | Status: DC | PRN
  Administered 2018-12-28: 10 mL
  Filled 2018-12-28: qty 10

## 2018-12-28 MED ORDER — CYANOCOBALAMIN 1000 MCG/ML IJ SOLN
INTRAMUSCULAR | Status: AC
  Filled 2018-12-28: qty 1

## 2018-12-28 MED ORDER — DEXTROSE 5 % IV SOLN
Freq: Once | INTRAVENOUS | Status: AC
  Administered 2018-12-28: 12:00:00 via INTRAVENOUS
  Filled 2018-12-28: qty 250

## 2018-12-28 MED ORDER — CYANOCOBALAMIN 1000 MCG/ML IJ SOLN
1000.0000 ug | Freq: Once | INTRAMUSCULAR | Status: AC
  Administered 2018-12-28: 1000 ug via INTRAMUSCULAR

## 2018-12-28 NOTE — Progress Notes (Signed)
Platelet count 74,000 reported to [name]. Received OK to treat.

## 2018-12-28 NOTE — Patient Instructions (Signed)
[facility] Discharge Instructions for Patients Receiving Chemotherapy  Today you received the following chemotherapy agents: Enhertu  To help prevent nausea and vomiting after your treatment, we encourage you to take your nausea medication as directed.   If you develop nausea and vomiting that is not controlled by your nausea medication, call the clinic.   BELOW ARE SYMPTOMS THAT SHOULD BE REPORTED IMMEDIATELY:  *FEVER GREATER THAN 100.5 F  *CHILLS WITH OR WITHOUT FEVER  NAUSEA AND VOMITING THAT IS NOT CONTROLLED WITH YOUR NAUSEA MEDICATION  *UNUSUAL SHORTNESS OF BREATH  *UNUSUAL BRUISING OR BLEEDING  TENDERNESS IN MOUTH AND THROAT WITH OR WITHOUT PRESENCE OF ULCERS  *URINARY PROBLEMS  *BOWEL PROBLEMS  UNUSUAL RASH Items with * indicate a potential emergency and should be followed up as soon as possible.  Feel free to call the clinic should you have any questions or concerns. The clinic phone number is [phone].  Please show the CHEMO ALERT CARD at check-in to the Emergency Department and triage nurse.   

## 2018-12-31 ENCOUNTER — Telehealth: Payer: Self-pay | Admitting: Hematology and Oncology

## 2018-12-31 NOTE — Telephone Encounter (Signed)
I talk with patient regarding additional appointments

## 2019-01-02 DIAGNOSIS — R531 Weakness: Secondary | ICD-10-CM | POA: Diagnosis not present

## 2019-01-02 DIAGNOSIS — Z170 Estrogen receptor positive status [ER+]: Secondary | ICD-10-CM | POA: Diagnosis not present

## 2019-01-02 DIAGNOSIS — C50411 Malignant neoplasm of upper-outer quadrant of right female breast: Secondary | ICD-10-CM | POA: Diagnosis not present

## 2019-01-02 DIAGNOSIS — C7951 Secondary malignant neoplasm of bone: Secondary | ICD-10-CM | POA: Diagnosis not present

## 2019-01-08 DIAGNOSIS — C7951 Secondary malignant neoplasm of bone: Secondary | ICD-10-CM | POA: Diagnosis not present

## 2019-01-08 DIAGNOSIS — Z170 Estrogen receptor positive status [ER+]: Secondary | ICD-10-CM | POA: Diagnosis not present

## 2019-01-08 DIAGNOSIS — R531 Weakness: Secondary | ICD-10-CM | POA: Diagnosis not present

## 2019-01-08 DIAGNOSIS — C50411 Malignant neoplasm of upper-outer quadrant of right female breast: Secondary | ICD-10-CM | POA: Diagnosis not present

## 2019-01-16 DIAGNOSIS — C50411 Malignant neoplasm of upper-outer quadrant of right female breast: Secondary | ICD-10-CM | POA: Diagnosis not present

## 2019-01-16 DIAGNOSIS — E785 Hyperlipidemia, unspecified: Secondary | ICD-10-CM | POA: Diagnosis not present

## 2019-01-16 DIAGNOSIS — I10 Essential (primary) hypertension: Secondary | ICD-10-CM | POA: Diagnosis not present

## 2019-01-16 DIAGNOSIS — C50911 Malignant neoplasm of unspecified site of right female breast: Secondary | ICD-10-CM | POA: Diagnosis not present

## 2019-01-17 ENCOUNTER — Ambulatory Visit
Admission: RE | Admit: 2019-01-17 | Discharge: 2019-01-17 | Disposition: A | Discharge status: Home / Self Care | Payer: Medicare Other | Source: Ambulatory Visit | Attending: Radiation Oncology | Admitting: Radiation Oncology

## 2019-01-17 DIAGNOSIS — C7949 Secondary malignant neoplasm of other parts of nervous system: Secondary | ICD-10-CM

## 2019-01-17 DIAGNOSIS — C7931 Secondary malignant neoplasm of brain: Secondary | ICD-10-CM | POA: Diagnosis not present

## 2019-01-17 DIAGNOSIS — C50919 Malignant neoplasm of unspecified site of unspecified female breast: Secondary | ICD-10-CM | POA: Diagnosis not present

## 2019-01-17 MED ORDER — GADOBENATE DIMEGLUMINE 529 MG/ML IV SOLN
11.0000 mL | Freq: Once | INTRAVENOUS | Status: AC | PRN
  Administered 2019-01-17: 11 mL via INTRAVENOUS

## 2019-01-17 NOTE — Progress Notes (Signed)
Patient Care Team: [name], MD as PCP - General (Family Medicine)  DIAGNOSIS:    ICD-10-CM   1. Primary cancer of upper outer quadrant of right female breast ([place])  C50.411     SUMMARY OF ONCOLOGIC HISTORY: Oncology History  Primary cancer of upper outer quadrant of right female breast ([place])  03/08/2012 Initial Diagnosis   Right breast invasive ductal carcinoma ER positive PR negative HER-2 positive Ki-67 44%; another breast mass biopsied in the anterior part of the breast which was also positive for malignancy that was HER-2 negative   03/14/2012 Cancer Staging   Staging form: Breast, AJCC 7th Edition - Pathologic: Stage IV (M1) - Signed by [name], NP on 06/22/2018   05/10/2012 Surgery   Right mastectomy and axillary lymph node dissection: Multifocal disease 5 cm, 1.7 cm, 1.6 cm, ER positive PR negative HER-2 positive Ki-67 44%, 3/7 lymph nodes positive   06/14/2012 - 06/12/2013 Chemotherapy   Adjuvant chemotherapy with Pierce 6 followed by Herceptin maintenance   10/25/2012 - 12/11/2012 Radiation Therapy   Adjuvant radiation therapy   02/07/2013 -  Anti-estrogen oral therapy   Letrozole 2.5 mg daily   02/14/2015 Imaging   MRI spine: Large destructive T5 lesion with severe pathologic compression fracture and extensive epidural tumor, severe spinal stenosis with moderate cord compression, tumor involvement of T4   03/18/2015 PET scan   Residual enhancing soft tissue adjacent to the spinal cord. No evidence of metastatic disease. Nonspecific uptake the left nipple   03/27/2015 - 03/30/2015 Hospital Admission   T4-6 decompression for spinal cord compression (lower extremity paralysis)   04/10/2015 -  Chemotherapy   Palliative treatment with Herceptin every 3 weeks with letrozole 2.5 mg daily   04/13/2015 - 05/19/2015 Radiation Therapy   Palliative radiation treatment to the spine   09/01/2015 Imaging   CT chest abdomen pelvis: Pathologic fracture with posterior  fusion at T5 no other evidence of metastatic disease in the chest abdomen pelvis   12/23/2015 Imaging   CT CAP: new nonspecific 0.6 cm lymph node was stated mediastinum needs follow-up CT, innumerable tiny groundglass pulmonary nodules throughout both lungs unchanged, patchy consolidation from radiation, T5 fracture, no mets   04/15/2016 PET scan   Rt para-spinal mass mildy hypermetabolic SUV 5.9, lytic cortical lesion 4.8 cm lesion left prox femur suv 8.8 favor osseus met disease   04/28/2016 - 05/12/2016 Radiation Therapy   Palliative XRT Left femur   05/24/2016 Procedure   Soft tissue mass biopsy back: Metastatic breast cancer ER 80%, PR 0%, Ki-67 50%, HER-2 positive ratio 2.25   06/09/2016 - 03/29/2017 Anti-estrogen oral therapy   Faslodex with Herceptin and Perjeta every 4 weeks   06/13/2016 - 06/15/2016 Hospital Admission   uncomplicated revision of previous thoracic instrumentation.   10/10/2016 PET scan   Interval development of new hypermetabolic 2.5 x 1.1 cm lesion posterior right eighth rib consistent with metastatic disease, stable right paraspinal lesion at T8, clustered soft tissue nodules in the posterior midline back near cervicothoracic junction smaller than before    11/03/2016 - 11/07/2016 Radiation Therapy   Palliative radiation to right eighth rib   11/04/2016 Imaging   Solitary contrast-enhancing lesion in the right cerebellum 6 x 2 mm concerning for metastatic lesion   11/24/2016 - 11/24/2016 Radiation Therapy   SRS to the brain lesion   04/18/2017 PET scan   New ill-defined rounded airspace opacity in posterior right lower lobe. Differential diagnosis includes malignancy and infectious/inflammatory process.Increased hypermetabolic lytic lesion  in T8 vertebral body, consistent with bone metastasis.  Increased hypermetabolic posterior upper chest wall subcutaneous nodules, highly suspicious metastatic disease. Further improvement in right posterior eighth rib metastasis.    05/04/2017 - 07/27/2017 Chemotherapy   Kadcyla every 3 weeks palliative chemotherapy stopped for progression   07/26/2017 Imaging   Interval increase in size of the multiple enhancing masses posteriorly overlying the spine, interval development of left axillary and mediastinal adenopathy, increase in the size of lytic lesions in T8-T9 and left ninth rib, increased groundglass opacities right lower lobe lung   07/27/2017 - 05/11/2018 Chemotherapy   Palbociclib + Faslodex + trastuzumab + Pertuzumab     04/03/2018 PET scan   PET CT scan showed progression of disease not only in the bones but also in the subcutaneous nodules.   05/15/2018 - 05/25/2018 Radiation Therapy   Palliative radiation to the back   06/01/2018 -  Chemotherapy   Enhertu (Transtuzumab Deruxtecan)    08/07/2018 - 08/07/2018 Radiation Therapy   T12 vertebral body SRS   Breast cancer metastasized to bone, right ([place])  02/14/2015 Initial Diagnosis   Breast cancer metastasized to bone, right ([place])   06/01/2018 -  Chemotherapy   The patient had palonosetron (ALOXI) injection 0.25 mg, 0.25 mg, Intravenous,  Once, 11 of 20 cycles Administration: 0.25 mg (06/01/2018), 0.25 mg (06/22/2018), 0.25 mg (08/24/2018), 0.25 mg (07/13/2018), 0.25 mg (08/03/2018), 0.25 mg (09/14/2018), 0.25 mg (10/05/2018), 0.25 mg (11/16/2018), 0.25 mg (10/26/2018), 0.25 mg (12/07/2018), 0.25 mg (12/28/2018) fam-trastuzumab deruxtecan-nxki (ENHERTU) 332 mg in dextrose 5 % 100 mL chemo infusion, 5.4 mg/kg = 332 mg, Intravenous,  Once, 11 of 20 cycles Dose modification: 4.4 mg/kg (original dose 5.4 mg/kg, Cycle 7, Reason: Provider Judgment), 3.2 mg/kg (original dose 5.4 mg/kg, Cycle 9, Reason: Dose not tolerated) Administration: 332 mg (06/01/2018), 332 mg (06/22/2018), 332 mg (07/13/2018), 332 mg (08/24/2018), 332 mg (08/03/2018), 332 mg (09/14/2018), 272 mg (10/05/2018), 200 mg (11/16/2018), 272 mg (10/26/2018), 200 mg (12/07/2018), 200 mg (12/28/2018)  for chemotherapy treatment.       CHIEF COMPLIANT: Follow-up of metastatic breast cancer on Enhertu   INTERVAL HISTORY: Heather [name] is a 73 y.o. with above-mentioned history of metastatic breast cancercurrently onchemotherapy with trastuzumab-Deruxtecan (Enhertu). Brain MRI on 01/17/19 showed no metastatic disease in the brain and stable enhancing lesions in the calvarium. She presents to the clinic todayfor cycle12. Apart from hair thinning she is tolerating the treatment extremely well.  She has a lot of energy and recently she has been making jams and jellies as well.  REVIEW OF SYSTEMS:   Constitutional: Denies fevers, chills or abnormal weight loss Eyes: Denies blurriness of vision Ears, nose, mouth, throat, and face: Denies mucositis or sore throat Respiratory: Denies cough, dyspnea or wheezes Cardiovascular: Denies palpitation, chest discomfort Gastrointestinal: Denies nausea, heartburn or change in bowel habits Skin: Denies abnormal skin rashes Lymphatics: Denies new lymphadenopathy or easy bruising Neurological: Denies numbness, tingling or new weaknesses Behavioral/Psych: Mood is stable, no new changes  Extremities: No lower extremity edema Breast: denies any pain or lumps or nodules in either breasts All other systems were reviewed with the patient and are negative.  I have reviewed the past medical history, past surgical history, social history and family history with the patient and they are unchanged from previous note.  ALLERGIES:  is allergic to other; acyclovir and related; chlorhexidine; zanaflex [tizanidine hcl]; codeine; keflex [cephalexin]; naprosyn [naproxen]; oxycodone; tramadol; and tussin [guaifenesin].  MEDICATIONS:  Current Outpatient Medications  Medication Sig Dispense Refill  .  ALPRAZolam (XANAX) 0.25 MG tablet Take 1 tablet by mouth as needed.    . Calcium Carbonate Antacid (TUMS PO) Take 2 tablets by mouth 2 (two) times daily as needed (acid reflux).    . Cholecalciferol (VITAMIN  D) 2000 UNITS tablet Take 2,000 Units by mouth daily.    . cycloSPORINE (RESTASIS) 0.05 % ophthalmic emulsion 1 drop 2 (two) times daily.    Marland Kitchen gabapentin (NEURONTIN) 600 MG tablet Take 1 tablet (600 mg total) by mouth as directed. Take 1 tablet in the AM and afternoon and 1 1/2 tabs at bedtime (Patient taking differently: Take 600 mg by mouth 3 (three) times daily. Take 1 tablet in the AM and afternoon and 1 1/2 tabs at bedtime) 315 tablet 2  . HYDROcodone-acetaminophen (NORCO/VICODIN) 5-325 MG tablet Take 1 tablet by mouth every 6 (six) hours as needed for moderate pain. 30 tablet 0  . lidocaine-prilocaine (EMLA) cream Apply to affected area once (Patient taking differently: Apply 1 application topically every 30 (thirty) days. Apply to port prior to infusions) 30 g 3  . omeprazole (PRILOSEC) 40 MG capsule TAKE 1 CAPSULE BY MOUTH ONCE DAILY FOR 30 DAYS  5  . ondansetron (ZOFRAN) 8 MG tablet Take 1 tablet (8 mg total) by mouth every 8 (eight) hours as needed for nausea or vomiting. 20 tablet 0  . OVER THE COUNTER MEDICATION Place 1 drop into both eyes 2 (two) times daily as needed (dry eyes). Over the counter lubricating eye drops    . polyethylene glycol (MIRALAX / GLYCOLAX) packet Take 17 g by mouth daily as needed.    . Probiotic Product (PROBIOTIC DAILY PO) Take 1 capsule by mouth daily.     . prochlorperazine (COMPAZINE) 10 MG tablet Take 1 tablet (10 mg total) by mouth every 6 (six) hours as needed for nausea or vomiting. 60 tablet 3  . spironolactone (ALDACTONE) 25 MG tablet Take 25 mg by mouth daily.     No current facility-administered medications for this visit.     PHYSICAL EXAMINATION: ECOG PERFORMANCE STATUS: 1 - Symptomatic but completely ambulatory  Vitals:   01/18/19 0930  BP: (!) 150/54  Pulse: 68  Resp: 18  Temp: 98 F (36.7 C)  SpO2: 98%   Filed Weights   01/18/19 0930  Weight: 123 lb 1.6 oz (55.8 kg)    GENERAL: alert, no distress and comfortable SKIN: skin  color, texture, turgor are normal, no rashes or significant lesions EYES: normal, Conjunctiva are pink and non-injected, sclera clear OROPHARYNX: no exudate, no erythema and lips, buccal mucosa, and tongue normal  NECK: supple, thyroid normal size, non-tender, without nodularity LYMPH: no palpable lymphadenopathy in the cervical, axillary or inguinal LUNGS: clear to auscultation and percussion with normal breathing effort HEART: regular rate & rhythm and no murmurs and no lower extremity edema ABDOMEN: abdomen soft, non-tender and normal bowel sounds MUSCULOSKELETAL: no cyanosis of digits and no clubbing  NEURO: alert & oriented x 3 with fluent speech, no focal motor/sensory deficits EXTREMITIES: No lower extremity edema  LABORATORY DATA:  I have reviewed the data as listed CMP Latest Ref Rng & Units 12/28/2018 12/07/2018 11/16/2018  Glucose 70 - 99 mg/dL 119(H) 100(H) 102(H)  BUN 8 - 23 mg/dL _0 Creatinine 0.44 - 1.00 mg/dL 0.97 0.76 0.78  Sodium 135 - 145 mmol/L 139 141 140  Potassium 3.5 - 5.1 mmol/L 3.6 4.0 4.0  Chloride 98 - 111 mmol/L 106 106 106  CO2 22 - 32 mmol/L  _0 Calcium 8.9 - 10.3 mg/dL 9.3 9.7 9.2  Total Protein 6.5 - 8.1 g/dL 7.0 7.0 7.0  Total Bilirubin 0.3 - 1.2 mg/dL 0.6 0.6 0.5  Alkaline Phos 38 - 126 U/L 111 98 95  AST 15 - 41 U/L 35 35 33  ALT 0 - 44 U/L _1 Lab Results  Component Value Date   WBC 2.6 (L) 01/18/2019   HGB 12.1 01/18/2019   HCT 36.4 01/18/2019   MCV 97.1 01/18/2019   PLT 79 (L) 01/18/2019   NEUTROABS 1.9 01/18/2019    ASSESSMENT & PLAN:  Primary cancer of upper outer quadrant of right female breast ([place]) Right breast invasive ductal carcinoma ER positive PR negative HER-2 positive Ki-67 44% multifocal disease 3/7 lymph nodes positive T2 N1 M0 stage IIB status post adjuvant chemotherapy with TCH followed by Herceptin maintenance, adjuvant radiation therapy and Letrozole. MRI 02/14/2015: T5 large destructive lesion with  pathologic compression fracture and extensive epidural tumor, involvement of T4, excision metastatic carcinoma ER 60%, PR 0%, HER-2 positive ratio 2.71 average copy #7.45 03/27/2015: T4-6 decompression surgery for spinal cord compression PET-CT 12/10/17Rt para-spinal mass mildy hypermetabolic SUV 5.9, lytic cortical lesion 4.8 cm lesion left prox femur suv 8.8 favor osseus met disease Echo 04/12/2017 shows well preserved ejection fraction of 55-60%  Treatment summary: 1. Resumed anastrozole 05/21/2015, but it has caused significant hair loss so we switched her to exemestane 25 mg once daily 07/23/2015 2. Herceptin every 3 week started 04/10/2015 3. Bone metastases: Zometa every 3 months 4. Radiation therapy to the spine (Dr. [name]) 04/13/2015 to 05/19/2015 5. Radiation therapy to left femur 04/28/2016 to 05/12/2016  6. SRS to the brain lesion 11/24/2016 7.Kadcyla started 05/05/2017 discontinued 07/27/2017 8.Palbociclib +Faslodex +trastuzumab+ Pertuzumabstarted 08/10/2017, stopped 04/04/2018 9. Enhertu started 06/01/18 -------------------------------------------------------------------------------------------------------------------------- XRT to T123/04/2019 PET CT scan 10/03/2018:stable, improvement in subcutaneous mets MRI brain 01/17/2019: no intraparenchymal metastases, calvarial metastases stable  Current treatment:Cycle12day 1Enhertu (Fam-trastuzumab-Durextecan) Echo 04/09/2018 shows EF of 55-60%.  Toxicities: 1. Severe fatigue-improved with dose reduction 2. Pain: controlled right now with Gabapentin. 3.Hair loss 4. Thrombocytopenia: plt count 79.Dose reduced with cycle 7.  We are switching her to every 4-week Enhertu We also plan to do scans every 6 months.    No orders of the defined types were placed in this encounter.  The patient has a good understanding of the overall plan. she agrees with it. she will call with any problems that may develop before the  next visit here.  [name], MD 01/18/2019  Julious Oka Dorshimer am acting as scribe for Dr. [name].  I have reviewed the above documentation for accuracy and completeness, and I agree with the above.

## 2019-01-18 ENCOUNTER — Inpatient Hospital Stay: Payer: Medicare Other

## 2019-01-18 ENCOUNTER — Inpatient Hospital Stay (HOSPITAL_BASED_OUTPATIENT_CLINIC_OR_DEPARTMENT_OTHER): Payer: Medicare Other | Admitting: Hematology and Oncology

## 2019-01-18 ENCOUNTER — Other Ambulatory Visit: Payer: Self-pay | Admitting: Radiation Therapy

## 2019-01-18 ENCOUNTER — Inpatient Hospital Stay: Payer: Medicare Other | Attending: Hematology and Oncology

## 2019-01-18 ENCOUNTER — Other Ambulatory Visit: Payer: Self-pay

## 2019-01-18 DIAGNOSIS — Z79899 Other long term (current) drug therapy: Secondary | ICD-10-CM | POA: Diagnosis not present

## 2019-01-18 DIAGNOSIS — Z7189 Other specified counseling: Secondary | ICD-10-CM

## 2019-01-18 DIAGNOSIS — C7951 Secondary malignant neoplasm of bone: Secondary | ICD-10-CM | POA: Insufficient documentation

## 2019-01-18 DIAGNOSIS — Z95828 Presence of other vascular implants and grafts: Secondary | ICD-10-CM

## 2019-01-18 DIAGNOSIS — Z79811 Long term (current) use of aromatase inhibitors: Secondary | ICD-10-CM | POA: Diagnosis not present

## 2019-01-18 DIAGNOSIS — C50411 Malignant neoplasm of upper-outer quadrant of right female breast: Secondary | ICD-10-CM | POA: Diagnosis not present

## 2019-01-18 DIAGNOSIS — Z23 Encounter for immunization: Secondary | ICD-10-CM | POA: Diagnosis not present

## 2019-01-18 DIAGNOSIS — D696 Thrombocytopenia, unspecified: Secondary | ICD-10-CM | POA: Insufficient documentation

## 2019-01-18 DIAGNOSIS — Z170 Estrogen receptor positive status [ER+]: Secondary | ICD-10-CM | POA: Insufficient documentation

## 2019-01-18 DIAGNOSIS — Z5112 Encounter for antineoplastic immunotherapy: Secondary | ICD-10-CM | POA: Insufficient documentation

## 2019-01-18 DIAGNOSIS — C50911 Malignant neoplasm of unspecified site of right female breast: Secondary | ICD-10-CM

## 2019-01-18 DIAGNOSIS — Z9221 Personal history of antineoplastic chemotherapy: Secondary | ICD-10-CM | POA: Diagnosis not present

## 2019-01-18 DIAGNOSIS — R531 Weakness: Secondary | ICD-10-CM

## 2019-01-18 DIAGNOSIS — G9529 Other cord compression: Secondary | ICD-10-CM

## 2019-01-18 DIAGNOSIS — Z923 Personal history of irradiation: Secondary | ICD-10-CM | POA: Diagnosis not present

## 2019-01-18 LAB — CBC WITH DIFFERENTIAL (CANCER CENTER ONLY)
Abs Immature Granulocytes: 0.01 10*3/uL (ref 0.00–0.07)
Basophils Absolute: 0 10*3/uL (ref 0.0–0.1)
Basophils Relative: 1 %
Eosinophils Absolute: 0.1 10*3/uL (ref 0.0–0.5)
Eosinophils Relative: 3 %
HCT: 36.4 % (ref 36.0–46.0)
Hemoglobin: 12.1 g/dL (ref 12.0–15.0)
Immature Granulocytes: 0 %
Lymphocytes Relative: 12 %
Lymphs Abs: 0.3 10*3/uL — ABNORMAL LOW (ref 0.7–4.0)
MCH: 32.3 pg (ref 26.0–34.0)
MCHC: 33.2 g/dL (ref 30.0–36.0)
MCV: 97.1 fL (ref 80.0–100.0)
Monocytes Absolute: 0.3 10*3/uL (ref 0.1–1.0)
Monocytes Relative: 12 %
Neutro Abs: 1.9 10*3/uL (ref 1.7–7.7)
Neutrophils Relative %: 72 %
Platelet Count: 79 10*3/uL — ABNORMAL LOW (ref 150–400)
RBC: 3.75 MIL/uL — ABNORMAL LOW (ref 3.87–5.11)
RDW: 15 % (ref 11.5–15.5)
WBC Count: 2.6 10*3/uL — ABNORMAL LOW (ref 4.0–10.5)
nRBC: 0 % (ref 0.0–0.2)

## 2019-01-18 LAB — CMP (CANCER CENTER ONLY)
ALT: 20 U/L (ref 0–44)
AST: 43 U/L — ABNORMAL HIGH (ref 15–41)
Albumin: 4 g/dL (ref 3.5–5.0)
Alkaline Phosphatase: 96 U/L (ref 38–126)
Anion gap: 10 (ref 5–15)
BUN: 15 mg/dL (ref 8–23)
CO2: 25 mmol/L (ref 22–32)
Calcium: 10.2 mg/dL (ref 8.9–10.3)
Chloride: 105 mmol/L (ref 98–111)
Creatinine: 0.82 mg/dL (ref 0.44–1.00)
GFR, Est AFR Am: >60 mL/min (ref >60)
GFR, Estimated: >60 mL/min (ref >60)
Glucose, Bld: 95 mg/dL (ref 70–99)
Potassium: 3.8 mmol/L (ref 3.5–5.1)
Sodium: 140 mmol/L (ref 135–145)
Total Bilirubin: 0.9 mg/dL (ref 0.3–1.2)
Total Protein: 7.3 g/dL (ref 6.5–8.1)

## 2019-01-18 MED ORDER — ACETAMINOPHEN 325 MG PO TABS
ORAL_TABLET | ORAL | Status: AC
  Filled 2019-01-18: qty 2

## 2019-01-18 MED ORDER — ACETAMINOPHEN 325 MG PO TABS
650.0000 mg | ORAL_TABLET | Freq: Once | ORAL | Status: AC
  Administered 2019-01-18: 650 mg via ORAL

## 2019-01-18 MED ORDER — DEXTROSE 5 % IV SOLN
Freq: Once | INTRAVENOUS | Status: AC
  Administered 2019-01-18: 10:00:00 via INTRAVENOUS
  Filled 2019-01-18: qty 250

## 2019-01-18 MED ORDER — CYANOCOBALAMIN 1000 MCG/ML IJ SOLN
INTRAMUSCULAR | Status: AC
  Filled 2019-01-18: qty 1

## 2019-01-18 MED ORDER — CYANOCOBALAMIN 1000 MCG/ML IJ SOLN
1000.0000 ug | Freq: Once | INTRAMUSCULAR | Status: AC
  Administered 2019-01-18: 11:00:00 1000 ug via INTRAMUSCULAR

## 2019-01-18 MED ORDER — INFLUENZA VAC A&B SA ADJ QUAD 0.5 ML IM PRSY
0.5000 mL | PREFILLED_SYRINGE | Freq: Once | INTRAMUSCULAR | Status: AC
  Administered 2019-01-18: 0.5 mL via INTRAMUSCULAR
  Filled 2019-01-18: qty 0.5

## 2019-01-18 MED ORDER — SODIUM CHLORIDE 0.9% FLUSH
10.0000 mL | INTRAVENOUS | Status: DC | PRN
  Administered 2019-01-18: 10 mL
  Filled 2019-01-18: qty 10

## 2019-01-18 MED ORDER — FAM-TRASTUZUMAB DERUXTECAN-NXKI CHEMO 100 MG IV SOLR
200.0000 mg | Freq: Once | INTRAVENOUS | Status: AC
  Administered 2019-01-18: 200 mg via INTRAVENOUS
  Filled 2019-01-18: qty 10

## 2019-01-18 MED ORDER — HEPARIN SOD (PORK) LOCK FLUSH 100 UNIT/ML IV SOLN
500.0000 [IU] | Freq: Once | INTRAVENOUS | Status: AC | PRN
  Administered 2019-01-18: 500 [IU]
  Filled 2019-01-18: qty 5

## 2019-01-18 MED ORDER — SODIUM CHLORIDE 0.9% FLUSH
10.0000 mL | Freq: Once | INTRAVENOUS | Status: AC
  Administered 2019-01-18: 09:00:00 10 mL
  Filled 2019-01-18: qty 10

## 2019-01-18 MED ORDER — PALONOSETRON HCL INJECTION 0.25 MG/5ML
INTRAVENOUS | Status: AC
  Filled 2019-01-18: qty 5

## 2019-01-18 MED ORDER — PALONOSETRON HCL INJECTION 0.25 MG/5ML
0.2500 mg | Freq: Once | INTRAVENOUS | Status: AC
  Administered 2019-01-18: 0.25 mg via INTRAVENOUS

## 2019-01-18 NOTE — Patient Instructions (Signed)
[facility] Discharge Instructions for Patients Receiving Chemotherapy  Today you received the following chemotherapy agents: Enhertu  To help prevent nausea and vomiting after your treatment, we encourage you to take your nausea medication as directed.   If you develop nausea and vomiting that is not controlled by your nausea medication, call the clinic.   BELOW ARE SYMPTOMS THAT SHOULD BE REPORTED IMMEDIATELY:  *FEVER GREATER THAN 100.5 F  *CHILLS WITH OR WITHOUT FEVER  NAUSEA AND VOMITING THAT IS NOT CONTROLLED WITH YOUR NAUSEA MEDICATION  *UNUSUAL SHORTNESS OF BREATH  *UNUSUAL BRUISING OR BLEEDING  TENDERNESS IN MOUTH AND THROAT WITH OR WITHOUT PRESENCE OF ULCERS  *URINARY PROBLEMS  *BOWEL PROBLEMS  UNUSUAL RASH Items with * indicate a potential emergency and should be followed up as soon as possible.  Feel free to call the clinic should you have any questions or concerns. The clinic phone number is [phone].  Please show the CHEMO ALERT CARD at check-in to the Emergency Department and triage nurse.   

## 2019-01-18 NOTE — Assessment & Plan Note (Signed)
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 MRI 02/14/2015: T5 large destructive lesion with pathologic compression fracture and extensive epidural tumor, involvement of T4, excision metastatic carcinoma ER 60%, PR 0%, HER-2 positive ratio 2.71 average copy #7.45 03/27/2015: T4-6 decompression surgery for spinal cord compression PET-CT 12/10/17Rt para-spinal mass mildy hypermetabolic SUV 5.9, lytic cortical lesion 4.8 cm lesion left prox femur suv 8.8 favor osseus met disease Echo 04/12/2017 shows well preserved ejection fraction of 55-60%  Treatment summary: 1. Resumed anastrozole 05/21/2015, but it has caused significant hair loss so we switched her to exemestane 25 mg once daily 07/23/2015 2. Herceptin every 3 week started 04/10/2015 3. Bone metastases: Zometa every 3 months 4. Radiation therapy to the spine (Dr. [name]) 04/13/2015 to 05/19/2015 5. Radiation therapy to left femur 04/28/2016 to 05/12/2016  6. SRS to the brain lesion 11/24/2016 7.Kadcyla started 05/05/2017 discontinued 07/27/2017 8.Palbociclib +Faslodex +trastuzumab+ Pertuzumabstarted 08/10/2017, stopped 04/04/2018 9. Enhertu started 06/01/18 -------------------------------------------------------------------------------------------------------------------------- XRT to T123/04/2019 PET CT scan 10/03/2018:stable, improvement in subcutaneous mets MRI brain 10/12/2018: no intraparenchymal metastases, calvarial metastases stable  Current treatment:Cycle12day 1Enhertu (Fam-trastuzumab-Durextecan) Echo 04/09/2018 shows EF of 55-60%.  Toxicities: 1. Severe fatigue-improved with dose reduction 2. Pain: controlled right now with Gabapentin. 3.Hair loss 4. Thrombocytopenia: plt count 74.Dose  reduced with cycle 7.  We are switching her to every 4-week Enhertu

## 2019-01-21 ENCOUNTER — Inpatient Hospital Stay: Payer: Medicare Other

## 2019-01-23 ENCOUNTER — Encounter: Payer: Self-pay | Admitting: Urology

## 2019-01-23 ENCOUNTER — Ambulatory Visit
Admission: RE | Admit: 2019-01-23 | Discharge: 2019-01-23 | Disposition: A | Discharge status: Home / Self Care | Payer: Medicare Other | Source: Ambulatory Visit | Attending: Urology | Admitting: Urology

## 2019-01-23 ENCOUNTER — Other Ambulatory Visit: Payer: Self-pay

## 2019-01-23 DIAGNOSIS — C7951 Secondary malignant neoplasm of bone: Secondary | ICD-10-CM | POA: Diagnosis not present

## 2019-01-23 DIAGNOSIS — C7931 Secondary malignant neoplasm of brain: Secondary | ICD-10-CM

## 2019-01-23 DIAGNOSIS — Z08 Encounter for follow-up examination after completed treatment for malignant neoplasm: Secondary | ICD-10-CM | POA: Diagnosis not present

## 2019-01-23 DIAGNOSIS — Z853 Personal history of malignant neoplasm of breast: Secondary | ICD-10-CM | POA: Diagnosis not present

## 2019-01-23 NOTE — Progress Notes (Signed)
Radiation Oncology         [phone] ________________________________  Name: Heather [name] MRN: [id number]  Date: 01/23/2019  DOB: 11-Nov-1945  Follow up Visit Note- Conducted via telephone due to current COVID-19 concerns for limiting patient exposure  CC: [name], MD  [name], MD  Diagnosis:   73 y.o. woman with ER positive, PR negative, HER-2 positive metastatic breast cancer with multifocal metastases involving the spine, bony skeleton, lung, and brain.   Interval Since Last Radiation: 5.5 months 08/07/18:  SRS Spine/ The targeted metastases in theT12 and L2vertebral bodiesweretreatedto a prescription dose of 18Gy in 1 fraction.   05/14/2018 - 05/25/2018:  Chest Wall, Right Upper Posterior subcutaneous lesions / treated to 30 Gy delivered in 10 fractions of 3 Gy  05/18/2017 - 05/31/2017:  The thoracic spine (T7-T9) was treated to 30 Gy in 10 fractions of 3 Gy.  6/27-7/18/18 SRS and palliative treatment: 1.  The rib 8th was treated to 30 Gy in 10 fractions 2.  Right cerebellar 6 mm target was treated using 3 Dynamic Conformal Arcs to a prescription dose of 20 Gy in one fraction  04/27/16-05/12/16: Left femur/ 30 Gy in 10 fractions  04/13/2015-05/19/2015 Postop:  55 Gy in 25 fractions of 2.2 Gy to the thoracic spine  10/25/12-12/11/12: 50.4 Gy to the right chest wall and regional lymph nodes in 28 fractions with a 10 Gy boost to the right chest wall in 1 session.  Narrative:  In summary, Heather [name] is a well known patient to our service with a history of metastatic breast cancer originally treated in 2014 to the chest wall and regional lymph nodes. She developed recurrent disease within the thoracic spine and subsequently has undergone 3 surgeries, in addition to radiotherapy, with her most recent surgery being on 06/13/2016 where she had removal of thoracic hardware and replacement of pedicle screws with stabilization.  Of note previously placed screws between T3, T4  T6 and T7 were identified and dissection was carried out inferiorly to T8, T9-T10 and T11, T8 through T11 pedicle screw trajectories were placed bilaterally. She recovered well since treatment to her spine as she had been paralyzed in 2016, and with rigorous therapy has been able to make an impressive recovery. She continues on systemic therapy under the care of [name] and is also followed along in the brain and spine oncology conference. PET scan on 10/10/16 revealed a new hypermetabolic 2.5 x 1.1 cm lesion in the posterior right 8th rib. She also mentioned having new onset headaches, dizziness, and an episode of visual change,so an MRI brain was performed 11/04/16 demonstrating a solitary contrast-enhancing lesion within the right cerebellum, measuring 6 x 2 mm and lying just beneath the right tentorial leaflet and concerning for a metastatic lesion.  This solitary lesion was confirmed with SRS protocol MRI brain on 11/16/16 with no new lesions noted. This lesion was treated with SRS on 11/23/16 which she tolerated very well. She also completed palliative radiotherapy to the right eighth rib from 11/02/16 - 11/23/16 with significant improvement in her pain. She remained on Herceptin, Perjeta, Faslodex and Zometa for systemic disease under the care and direction of [name].  She developed increased pain in the T8 region and also had PET positive findings in this region, as well as hypermetabolic right lower lobe mass concerning for progressive metastatic disease on follow up imaging 04/18/2017, despite systemic treatment. Her systemic therapy was switched to Kadcyla on 05/05/17 with discontinuation of Faslodex, Herceptin, and  Perjeta.  She proceeded with palliative radiotherapy to 30 Gy in 10 fractions to the thoracic spine (T7-T9) from 05/18/2017 - 05/31/2017.  This treatment was tolerated very well.  A repeat brain MRI performed 06/15/17 showed no new or progressive contrast enhancing lesions and no residual  abnormality at the site of the previous treated right cerebellar lesion but Chest CT on 07/26/17 showed clear disease progression with interval increase in size of the multiple enhancing masses posteriorly overlying the spine, interval development of left axillary and mediastinal adenopathy, increase in the size of lytic lesions in T8-T9 and left ninth rib, as well as increased groundglass opacities right lower lobe lung. Therefore, she was switched to Palbociclib + Faslodex + trastuzumab + Pertuzumab, started 08/10/17.   MRI T-spine on 09/21/17 showed the known T12 lesion had slightly progressed by a few millimeters from 03/15/17 and there was a new lesion at L2 which was not present on lumbar MRI from 07/13/16. Follow up systemic imaging on 12/13/17, showed a good response to treatment with improved LAN and decreased subcutaneous nodules in the upper thoracic region.  Follow up brain MRI imaging has continued to show interval increase in size the right parietal calvarium metastasis with stable appearance in size of a right frontal calvarial metastasis and no parenchymal or dural metastases. PET scan from 04/03/2018 unfortunately showed significant disease progression despite systemic therapy.  Her systemic chemotherapy was discontinued on 04/04/2018 due to disease progression and she was switched to [facility] (Fam-trastuzumab-Durextecan) q 3 weeks beginning 06/01/18.  Repeat MRI imaging of the brain and thoracic spine was performed on 07/12/2018.  Brain imaging demonstrated 3 skull metastases ranging from 5 to 14 mm in diameter which appeared stable to slightly increased since December 2019 but no new metastases or brain parenchymal or dural metastases identified.  On thoracic spine imaging there was enlargement of the left paracentral T12 vertebral body metastasis increased from 14 mm in May 2019 to 20 mm currently without associated pathologic fracture and otherwise stable appearance of previously treated lesions in the  thoracic spine with no new disease noted.  The posterior subcutaneous nodular metastases appear regressed.  Her imaging studies were reviewed and discussed at the multidisciplinary brain and spine conference on 07/16/2018 and the consensus recommendation was to proceed with stereotactic radiosurgery [facility]) to the progressive disease at T12 which was completed on 08/07/18.  At the time of her CT [facility] for treatment planning, it was also noted that she had an enlarging lesion in the vertebral body of L2 as compared to her PET scan from 03/28/2018 so this lesion was included in the planning and was treated with a single fraction SRS at the time of T12 treatment as well. She tolerated the treatment well and did not experience any ill side effects.  Follow up systemic imaging with PET 10/03/18 showed a good response to treatment with less activity in the recently treated lesions at L2 and T12, no new bony lesions, as well as resolution of subcutaneous nodules in the upper back and pubic area and resolution of right adrenal hypermetabolic mass.  Her brain MRI from 10/13/18 was also stable with no visible intraparenchymal metastases to the brain and an unchanged appearance of 3 calvarial metastases.  INTERVAL HISTORY:   I spoke with the patient to conduct her routine scheduled 3 month follow up visit to review her recent brain MRI via telephone to spare the patient unnecessary potential exposure in the healthcare setting during the current COVID-19 pandemic.  The patient  was notified in advance and gave permission to proceed with this visit format.   She reports that she is doing well overall and has not experienced any ill side effects associated with her recent SRS spine treatment.  She denies back pain currently.  She has longstanding weakness in bilateral LEs, unchanged recently and she associates this with her inactivity due to pronounced fatigue following systemic treatments.  She reports that it begins to improve  approximately 2 weeks after her treatments when she is able to become more mobile but resumes following each treatment every 3 weeks.  She denies any new or increased paraesthesias in the lower extremities but reports neuropathy in bilateral UEs as well as "nerve pain" in the right axilla that travels down her right side/torso.  She also reports chronic right>left jaw pain and reports that her dentist has told her previously that she had some loss of bone in the right jaw based on xray evaluation.  She is taking Zometa q 3 months and will discuss this with [name].  She has continued her systemic therapy with ENHERTU under the care and direction of [name] having completed 11 cycles as of 12/28/2018 and the current plan is to continue on this regimen given her stability of systemic disease noted on recent CT C/A/P from 12/27/18. Today, we reviewed her recent MRI brain scan from 01/17/19 which also shows disease stability with no intraparenchymal lesions and stable enhancing lesions in the calvarium- no new lesions.  On review of systems, the patient reports that she continues doing well overall.  She continues with moderate fatigue which is tolerable and not significantly interfering with her daily activities aside from the first 2 weeks after systemic treatments.  She has tolerated her systemic therapy much better since her dose was reduced on 10/05/18.  She has persistent low grade nausea but no vomiting and is able to eat.  She remains as active as she can be.  She has noted continued improvement in the size of the subcutaneous nodules and in the burning/tingling pain that radiated from the left aspect of the spine underneath her breast since completing radiotherapy. She has not had recent changes in visual or auditory acuity, tinnitus, increased imbalance, tremor or seizure activity. She denies recent headaches, sternal chest pain, shortness of breath, cough, fevers, chills, night sweats, or recent  unintended weight changes. She denies any bowel or bladder disturbances, and denies abdominal pain or vomiting currently. She denies any new musculoskeletal or joint aches or pains, new skin lesions or concerns aside from those mentioned above in the HPI.  A complete review of systems is obtained and is otherwise negative.  Past Medical History:  Past Medical History:  Diagnosis Date   Anemia    hx of   Anxiety    r/t updated surgery   Breast cancer ([place])    right   Cataract    immature-not sure of which eye   Complication of anesthesia    pt states she is sensitive to meds   Dizziness    has been going on for 65month;medical MD aware   Genetic testing 07/11/2016   Test Results: Normal; No pathogenic mutations detected  Genes Analyzed: 43 genes on [employer] Common Cancers panel (APC, ATM, AXIN2, BARD1, BMPR1A, BRCA1, BRCA2, BRIP1, CDH1, CDKN2A, CHEK2, DICER1, EPCAM, GREM1, HOXB13, KIT, MEN1, MLH1, MSH2, MSH6, MUTYH, NBN, NF1, PALB2, PDGFRA, PMS2, POLD1, POLE, PTEN, RAD50, RAD51C, RAD51D, SDHA, SDHB, SDHC, SDHD, SMAD4, SMARCA4, STK11, TP53, TSC1, TSC2, VHL).  GERD (gastroesophageal reflux disease)    Tums prn   H. pylori infection    H/O hiatal hernia    Hemorrhoids    History of UTI    Hx of radiation therapy 10/25/12- 12/11/12   right chest wall/regional lymph nodes 5040 cGy, 28 sessions, right chest wall boost 1000 cGy 1 session   Hx: UTI (urinary tract infection)    Hyperlipidemia    but not on meds;diet and exercise controlled   Hypertension    recently started Aldactone    Insomnia    takes Melatoniin daily   Metastasis to spinal column (HCC)    T5, Left  Femur cancer- radiation    Neuropathy    "from back surgery"   PONV (postoperative nausea and vomiting)    pt experienced hair loss, confusion and combative- 02/15/15   Right knee pain    Right shoulder pain    Skin cancer    squamous, Nodule to back - "same cancer as breast."   Urinary frequency      d/t taking Aldactone    Past Surgical History: Past Surgical History:  Procedure Laterality Date   COLONOSCOPY     cyst removed from left breast  [place] 4 N/A 02/15/2015   Procedure: DECOMPRESSION T5 AND T3-T7 STABALIZATION;  Surgeon: Consuella Lose, MD;  Location: [place] NEURO ORS;  Service: Neurosurgery;  Laterality: N/A;   ESOPHAGOGASTRODUODENOSCOPY     LAMINECTOMY N/A 03/27/2015   Procedure: Thoracic Four-Thoracic Six Laminectomy for tumor;  Surgeon: Consuella Lose, MD;  Location: [place] NEURO ORS;  Service: Neurosurgery;  Laterality: N/A;  T4-T6 Laminectomy   left wrist surgery   2004   with plate   MASTECTOMY MODIFIED RADICAL  05/10/2012   Procedure: MASTECTOMY MODIFIED RADICAL;  Surgeon: Merrie Roof, MD;  Location: [place];  Service: General;  Laterality: Right;  RIGHT MODIFIED RADICAL MASTECTOMY   MASTECTOMY, RADICAL Right    MINOR BREAST BIOPSY Left 05/16/2016   Procedure: EXCISION OF BACK NODULE;  Surgeon: Autumn Messing III, MD;  Location: [place];  Service: General;  Laterality: Left;   Nodule  back  05/17/2015   PORTACATH PLACEMENT  05/10/2012   Procedure: INSERTION PORT-A-CATH;  Surgeon: Merrie Roof, MD;  Location: [place];  Service: General;  Laterality: Left;    Social History:  Social History   Socioeconomic History   Marital status: Married    Spouse name: Not on file   Number of children: 2   Years of education: Not on file   Highest education level: Not on file  Occupational History   Not on file  Social Needs   Financial resource strain: Not on file   Food insecurity    Worry: Not on file    Inability: Not on file   Transportation needs    Medical: Not on file    Non-medical: Not on file  Tobacco Use   Smoking status: Never Smoker   Smokeless tobacco: Never Used  Substance and Sexual Activity   Alcohol use: No   Drug use: No   Sexual activity: Yes    Birth  control/protection: Post-menopausal  Lifestyle   Physical activity    Days per week: Not on file    Minutes per session: Not on file   Stress: Not on file  Relationships   Social connections    Talks on phone: Not on file    Gets together: Not on file  Attends religious service: Not on file    Active member of club or organization: Not on file    Attends meetings of clubs or organizations: Not on file    Relationship status: Not on file   Intimate partner violence    Fear of current or ex partner: Not on file    Emotionally abused: Not on file    Physically abused: Not on file    Forced sexual activity: Not on file  Other Topics Concern   Not on file  Social History Narrative   Not on file    Family History: Family History  Problem Relation Age of Onset   Leukemia Mother 51       deceased 16   Stroke Father    Diabetes Mellitus II Father    Prostate cancer Brother 56       currently 48   Stomach cancer Maternal Grandmother 81       deceased 27   Prostate cancer Brother 79       currently 33     ALLERGIES:  is allergic to other; acyclovir and related; chlorhexidine; zanaflex [tizanidine hcl]; codeine; keflex [cephalexin]; naprosyn [naproxen]; oxycodone; tramadol; and tussin [guaifenesin].  Meds: Current Outpatient Medications  Medication Sig Dispense Refill   ALPRAZolam (XANAX) 0.25 MG tablet Take 1 tablet by mouth as needed.     Calcium Carbonate Antacid (TUMS PO) Take 2 tablets by mouth 2 (two) times daily as needed (acid reflux).     Cholecalciferol (VITAMIN D) 2000 UNITS tablet Take 2,000 Units by mouth daily.     cycloSPORINE (RESTASIS) 0.05 % ophthalmic emulsion 1 drop 2 (two) times daily.     gabapentin (NEURONTIN) 600 MG tablet Take 1 tablet (600 mg total) by mouth as directed. Take 1 tablet in the AM and afternoon and 1 1/2 tabs at bedtime (Patient taking differently: Take 600 mg by mouth 3 (three) times daily. Take 1 tablet in the AM and  afternoon and 1 1/2 tabs at bedtime) 315 tablet 2   HYDROcodone-acetaminophen (NORCO/VICODIN) 5-325 MG tablet Take 1 tablet by mouth every 6 (six) hours as needed for moderate pain. 30 tablet 0   lidocaine-prilocaine (EMLA) cream Apply to affected area once (Patient taking differently: Apply 1 application topically every 30 (thirty) days. Apply to port prior to infusions) 30 g 3   omeprazole (PRILOSEC) 40 MG capsule TAKE 1 CAPSULE BY MOUTH ONCE DAILY FOR 30 DAYS  5   ondansetron (ZOFRAN) 8 MG tablet Take 1 tablet (8 mg total) by mouth every 8 (eight) hours as needed for nausea or vomiting. 20 tablet 0   OVER THE COUNTER MEDICATION Place 1 drop into both eyes 2 (two) times daily as needed (dry eyes). Over the counter lubricating eye drops     polyethylene glycol (MIRALAX / GLYCOLAX) packet Take 17 g by mouth daily as needed.     Probiotic Product (PROBIOTIC DAILY PO) Take 1 capsule by mouth daily.      prochlorperazine (COMPAZINE) 10 MG tablet Take 1 tablet (10 mg total) by mouth every 6 (six) hours as needed for nausea or vomiting. 60 tablet 3   spironolactone (ALDACTONE) 25 MG tablet Take 25 mg by mouth daily.     No current facility-administered medications for this encounter.     Physical Findings:  Unable to assess due to telephone visit format.   Lab Findings: Lab Results  Component Value Date   WBC 2.6 (L) 01/18/2019   HGB 12.1 01/18/2019  HCT 36.4 01/18/2019   MCV 97.1 01/18/2019   PLT 79 (L) 01/18/2019     Radiographic Findings: Ct Chest W Contrast  Result Date: 12/27/2018 CLINICAL DATA:  Breast cancer with lung and bone metastases. EXAM: CT CHEST, ABDOMEN, AND PELVIS WITH CONTRAST TECHNIQUE: Multidetector CT imaging of the chest, abdomen and pelvis was performed following the standard protocol during bolus administration of intravenous contrast. CONTRAST:  168m OMNIPAQUE IOHEXOL 300 MG/ML  SOLN COMPARISON:  10/03/2018 PET-CT. FINDINGS: CT CHEST FINDINGS  Cardiovascular: The heart size is normal. No substantial pericardial effusion. Atherosclerotic calcification is noted in the wall of the thoracic aorta. Right Port-A-Cath tip is positioned in the mid to lower right atrium. Mediastinum/Nodes: No mediastinal lymphadenopathy. There is no hilar lymphadenopathy. The esophagus has normal imaging features. There is no axillary lymphadenopathy. Lungs/Pleura: Subpleural reticulation noted anterolateral right upper lobe and paraspinal lungs bilaterally, stable. Similar appearance of atelectasis and posterior airspace opacity in the right lower lobe (93/4). Subsegmental atelectasis with tiny peripheral ground-glass nodules in the left lower lung are similar to prior. No pleural effusion. Musculoskeletal: Extensive thoracic fusion hardware evident. Lucent lesion in the right acromion is similar. Previously measured lesion in T12 is 19 x 16 mm today compared to 19 x 17 mm previously. CT ABDOMEN PELVIS FINDINGS Hepatobiliary: No suspicious focal abnormality within the liver parenchyma. There is no evidence for gallstones, gallbladder wall thickening, or pericholecystic fluid. No intrahepatic or extrahepatic biliary dilation. Pancreas: No focal mass lesion. No dilatation of the main duct. No intraparenchymal cyst. No peripancreatic edema. Spleen: No splenomegaly. No focal mass lesion. Adrenals/Urinary Tract: No adrenal nodule or mass. Kidneys unremarkable. No evidence for hydroureter. The urinary bladder appears normal for the degree of distention. Stomach/Bowel: Tiny hiatal hernia. Duodenum is normally positioned as is the ligament of Treitz. No small bowel wall thickening. No small bowel dilatation. The terminal ileum is normal. The appendix is not visualized, but there is no edema or inflammation in the region of the cecum. No gross colonic mass. No colonic wall thickening. Vascular/Lymphatic: No abdominal aortic aneurysm. No abdominal lymphadenopathy. No pelvic sidewall  lymphadenopathy. Reproductive: The uterus is unremarkable.  There is no adnexal mass. Other: No intraperitoneal free fluid. Musculoskeletal: Similar appearance of the right L2 lesion measuring 2.4 x 1.9 cm today. Subtle 9 mm sclerotic focus in the L1 vertebral body is similar. IMPRESSION: 1. No substantial interval change in exam. No new or progressive interval findings. 2. Previously characterized mixed lytic/sclerotic lesions in the T12 and L2 vertebral bodies are not substantially changed in the interval. No new osseous lesions identified. 3.  Aortic Atherosclerois (ICD10-170.0) Electronically Signed   By: EMisty StanleyM.D.   On: 12/27/2018 09:43   Mr BJeri CosWQRContrast  Result Date: 01/17/2019 CLINICAL DATA:  Follow-up of treated brain metastasis. Breast cancer EXAM: MRI HEAD WITHOUT AND WITH CONTRAST TECHNIQUE: Multiplanar, multiecho pulse sequences of the brain and surrounding structures were obtained without and with intravenous contrast. CONTRAST:  184mMULTIHANCE GADOBENATE DIMEGLUMINE 529 MG/ML IV SOLN COMPARISON:  MRI head 10/13/2018 FINDINGS: Brain: Ventricle size normal. Negative for acute infarct. Scattered small white matter hyperintensities bilaterally in the frontal lobes unchanged. Negative for hemorrhage or mass. No enhancing lesions in the brain. Vascular: Normal arterial flow voids. Skull and upper cervical spine: Enhancing lesion in the right temporal bone unchanged. Enhancing lesion right parietal bone unchanged. Enhancing lesion right frontal bone unchanged. No new bone lesions. Sinuses/Orbits: Mild mucosal edema paranasal sinuses.  Normal orbit Other: None IMPRESSION:  No metastatic disease in the brain.  Stable appearance. Stable enhancing lesions in the calvarium. Three skull lesions are stable compared to the prior MRI. Electronically Signed   By: Franchot Gallo M.D.   On: 01/17/2019 21:33   Ct Abdomen Pelvis W Contrast  Result Date: 12/27/2018 CLINICAL DATA:  Breast cancer with  lung and bone metastases. EXAM: CT CHEST, ABDOMEN, AND PELVIS WITH CONTRAST TECHNIQUE: Multidetector CT imaging of the chest, abdomen and pelvis was performed following the standard protocol during bolus administration of intravenous contrast. CONTRAST:  137m OMNIPAQUE IOHEXOL 300 MG/ML  SOLN COMPARISON:  10/03/2018 PET-CT. FINDINGS: CT CHEST FINDINGS Cardiovascular: The heart size is normal. No substantial pericardial effusion. Atherosclerotic calcification is noted in the wall of the thoracic aorta. Right Port-A-Cath tip is positioned in the mid to lower right atrium. Mediastinum/Nodes: No mediastinal lymphadenopathy. There is no hilar lymphadenopathy. The esophagus has normal imaging features. There is no axillary lymphadenopathy. Lungs/Pleura: Subpleural reticulation noted anterolateral right upper lobe and paraspinal lungs bilaterally, stable. Similar appearance of atelectasis and posterior airspace opacity in the right lower lobe (93/4). Subsegmental atelectasis with tiny peripheral ground-glass nodules in the left lower lung are similar to prior. No pleural effusion. Musculoskeletal: Extensive thoracic fusion hardware evident. Lucent lesion in the right acromion is similar. Previously measured lesion in T12 is 19 x 16 mm today compared to 19 x 17 mm previously. CT ABDOMEN PELVIS FINDINGS Hepatobiliary: No suspicious focal abnormality within the liver parenchyma. There is no evidence for gallstones, gallbladder wall thickening, or pericholecystic fluid. No intrahepatic or extrahepatic biliary dilation. Pancreas: No focal mass lesion. No dilatation of the main duct. No intraparenchymal cyst. No peripancreatic edema. Spleen: No splenomegaly. No focal mass lesion. Adrenals/Urinary Tract: No adrenal nodule or mass. Kidneys unremarkable. No evidence for hydroureter. The urinary bladder appears normal for the degree of distention. Stomach/Bowel: Tiny hiatal hernia. Duodenum is normally positioned as is the ligament  of Treitz. No small bowel wall thickening. No small bowel dilatation. The terminal ileum is normal. The appendix is not visualized, but there is no edema or inflammation in the region of the cecum. No gross colonic mass. No colonic wall thickening. Vascular/Lymphatic: No abdominal aortic aneurysm. No abdominal lymphadenopathy. No pelvic sidewall lymphadenopathy. Reproductive: The uterus is unremarkable.  There is no adnexal mass. Other: No intraperitoneal free fluid. Musculoskeletal: Similar appearance of the right L2 lesion measuring 2.4 x 1.9 cm today. Subtle 9 mm sclerotic focus in the L1 vertebral body is similar. IMPRESSION: 1. No substantial interval change in exam. No new or progressive interval findings. 2. Previously characterized mixed lytic/sclerotic lesions in the T12 and L2 vertebral bodies are not substantially changed in the interval. No new osseous lesions identified. 3.  Aortic Atherosclerois (ICD10-170.0) Electronically Signed   By: EMisty StanleyM.D.   On: 12/27/2018 09:43    Impression/Plan:  This visit was conducted via telephone to spare the patient unnecessary potential exposure in the healthcare setting during the current COVID-19 pandemic. 154 73yo woman with ER positive breast cancer with a solitary brain metastasis and bony metastatses with diease to the spine.  Regarding her metastatic brain disease, she remains both clinically and radiographically stable with her recent brain MRI from 01/17/19 confirming disease stability with no visible intraparenchymal metastases to the brain and an unchanged appearance of 3 calvarial metastases. As she remains asymptomatic regarding the calvarial lesions and without evidence of new or progressive parenchymal lesions, as per recommendations from recent multidisciplinary brain and spine conference, we  will continue to monitor her closely with serial brain MRI scans.  She has requested that we extend the interval between scans since she has recently  moved to systemic imaging every 6 months with [name] so we will plan to obtain her next scan in 4 months and a follow-up office or telephone visit thereafter to review results.  Her next brain MRI will be due in January 2021.  Regarding her systemic disease, she will continue on her current chemotherapy regimen with routine follow-up for disease management under the care and direction of [name].   2. Spine metastases.  She appears to have recovered well from the effects of her most recent Jennings treatment to T12 and L2. She is currently without complaints.  She continues to prefer to have this followed with systemic imaging under the care and direction of [name] and only repeat spine MRI if she becomes symptomatic.  We have previously discussed that CT imaging is not the ideal imaging modailty for evaluation of the spine but certainly understand her concerns for limiting the number of imaging studies that she is subjected to on a routine basis.  She understands and accepts the associated risks of missing a very small spinal lesion on CT.  Her recent CT C/A/P imaging from 12/27/18 showed continued disease stability with no new or progressive interval findings. The previously characterized mixed lytic/sclerotic lesions in the T12 and L2 vertebral bodies are not substantially changed in the interval and there are no new osseous lesions identified. She is a most reliable patient and will let us know if she becomes symptomatic in the spine so that we can more thoroughly evaluate with MRI if/when necessary.  She appears to have a good understanding of her overall disease and our recommendations and is in agreement with the stated plan.  She knows to call at anytime in the interim with any questions or concerns.  Given current concerns for patient exposure during the COVID-19 pandemic, this encounter was conducted via telephone. The patient was notified in advance and was offered a Meta meeting to allow for  face to face communication but unfortunately reported that she did not have the appropriate resources/technology to support such a visit and instead preferred to proceed with telephone consult. The patient has given verbal consent for this type of encounter. The time spent during this encounter was 25 minutes with approximately 50% of that time spent in counseling and coordination of care. The attendants for this meeting include Keymani Mclean PA-C and patient, Heather [name]. During the encounter, Zyan Mirkin PA-C was located at [facility] Department.  Patient, Heather [name] was located at home.   Nicholos Johns, PA-C

## 2019-02-14 NOTE — Progress Notes (Signed)
Patient Care Team: [name], MD as PCP - General (Family Medicine)  DIAGNOSIS:    ICD-10-CM   1. Primary cancer of upper outer quadrant of right female breast ([place])  C50.411     SUMMARY OF ONCOLOGIC HISTORY: Oncology History  Primary cancer of upper outer quadrant of right female breast ([place])  03/08/2012 Initial Diagnosis   Right breast invasive ductal carcinoma ER positive PR negative HER-2 positive Ki-67 44%; another breast mass biopsied in the anterior part of the breast which was also positive for malignancy that was HER-2 negative   03/14/2012 Cancer Staging   Staging form: Breast, AJCC 7th Edition - Pathologic: Stage IV (M1) - Signed by [name], NP on 06/22/2018   05/10/2012 Surgery   Right mastectomy and axillary lymph node dissection: Multifocal disease 5 cm, 1.7 cm, 1.6 cm, ER positive PR negative HER-2 positive Ki-67 44%, 3/7 lymph nodes positive   06/14/2012 - 06/12/2013 Chemotherapy   Adjuvant chemotherapy with Pierce 6 followed by Herceptin maintenance   10/25/2012 - 12/11/2012 Radiation Therapy   Adjuvant radiation therapy   02/07/2013 -  Anti-estrogen oral therapy   Letrozole 2.5 mg daily   02/14/2015 Imaging   MRI spine: Large destructive T5 lesion with severe pathologic compression fracture and extensive epidural tumor, severe spinal stenosis with moderate cord compression, tumor involvement of T4   03/18/2015 PET scan   Residual enhancing soft tissue adjacent to the spinal cord. No evidence of metastatic disease. Nonspecific uptake the left nipple   03/27/2015 - 03/30/2015 Hospital Admission   T4-6 decompression for spinal cord compression (lower extremity paralysis)   04/10/2015 -  Chemotherapy   Palliative treatment with Herceptin every 3 weeks with letrozole 2.5 mg daily   04/13/2015 - 05/19/2015 Radiation Therapy   Palliative radiation treatment to the spine   09/01/2015 Imaging   CT chest abdomen pelvis: Pathologic fracture with posterior  fusion at T5 no other evidence of metastatic disease in the chest abdomen pelvis   12/23/2015 Imaging   CT CAP: new nonspecific 0.6 cm lymph node was stated mediastinum needs follow-up CT, innumerable tiny groundglass pulmonary nodules throughout both lungs unchanged, patchy consolidation from radiation, T5 fracture, no mets   04/15/2016 PET scan   Rt para-spinal mass mildy hypermetabolic SUV 5.9, lytic cortical lesion 4.8 cm lesion left prox femur suv 8.8 favor osseus met disease   04/28/2016 - 05/12/2016 Radiation Therapy   Palliative XRT Left femur   05/24/2016 Procedure   Soft tissue mass biopsy back: Metastatic breast cancer ER 80%, PR 0%, Ki-67 50%, HER-2 positive ratio 2.25   06/09/2016 - 03/29/2017 Anti-estrogen oral therapy   Faslodex with Herceptin and Perjeta every 4 weeks   06/13/2016 - 06/15/2016 Hospital Admission   uncomplicated revision of previous thoracic instrumentation.   10/10/2016 PET scan   Interval development of new hypermetabolic 2.5 x 1.1 cm lesion posterior right eighth rib consistent with metastatic disease, stable right paraspinal lesion at T8, clustered soft tissue nodules in the posterior midline back near cervicothoracic junction smaller than before    11/03/2016 - 11/07/2016 Radiation Therapy   Palliative radiation to right eighth rib   11/04/2016 Imaging   Solitary contrast-enhancing lesion in the right cerebellum 6 x 2 mm concerning for metastatic lesion   11/24/2016 - 11/24/2016 Radiation Therapy   SRS to the brain lesion   04/18/2017 PET scan   New ill-defined rounded airspace opacity in posterior right lower lobe. Differential diagnosis includes malignancy and infectious/inflammatory process.Increased hypermetabolic lytic lesion  in T8 vertebral body, consistent with bone metastasis.  Increased hypermetabolic posterior upper chest wall subcutaneous nodules, highly suspicious metastatic disease. Further improvement in right posterior eighth rib metastasis.    05/04/2017 - 07/27/2017 Chemotherapy   Kadcyla every 3 weeks palliative chemotherapy stopped for progression   07/26/2017 Imaging   Interval increase in size of the multiple enhancing masses posteriorly overlying the spine, interval development of left axillary and mediastinal adenopathy, increase in the size of lytic lesions in T8-T9 and left ninth rib, increased groundglass opacities right lower lobe lung   07/27/2017 - 05/11/2018 Chemotherapy   Palbociclib + Faslodex + trastuzumab + Pertuzumab     04/03/2018 PET scan   PET CT scan showed progression of disease not only in the bones but also in the subcutaneous nodules.   05/15/2018 - 05/25/2018 Radiation Therapy   Palliative radiation to the back   06/01/2018 -  Chemotherapy   Enhertu (Transtuzumab Deruxtecan)    08/07/2018 - 08/07/2018 Radiation Therapy   T12 vertebral body SRS   Carcinoma of right breast, stage 4 (H[facility])  02/14/2015 Initial Diagnosis   Breast cancer metastasized to bone, right ([place])   06/01/2018 -  Chemotherapy   The patient had palonosetron (ALOXI) injection 0.25 mg, 0.25 mg, Intravenous,  Once, 12 of 20 cycles Administration: 0.25 mg (06/01/2018), 0.25 mg (06/22/2018), 0.25 mg (08/24/2018), 0.25 mg (07/13/2018), 0.25 mg (08/03/2018), 0.25 mg (09/14/2018), 0.25 mg (10/05/2018), 0.25 mg (11/16/2018), 0.25 mg (10/26/2018), 0.25 mg (12/07/2018), 0.25 mg (12/28/2018), 0.25 mg (01/18/2019) fam-trastuzumab deruxtecan-nxki (ENHERTU) 332 mg in dextrose 5 % 100 mL chemo infusion, 5.4 mg/kg = 332 mg, Intravenous,  Once, 12 of 20 cycles Dose modification: 4.4 mg/kg (original dose 5.4 mg/kg, Cycle 7, Reason: Provider Judgment), 3.2 mg/kg (original dose 5.4 mg/kg, Cycle 9, Reason: Dose not tolerated) Administration: 332 mg (06/01/2018), 332 mg (06/22/2018), 332 mg (07/13/2018), 332 mg (08/24/2018), 332 mg (08/03/2018), 332 mg (09/14/2018), 272 mg (10/05/2018), 200 mg (11/16/2018), 272 mg (10/26/2018), 200 mg (12/07/2018), 200 mg (12/28/2018), 200 mg (01/18/2019)   for chemotherapy treatment.      CHIEF COMPLIANT: Follow-up of metastatic breast cancer onEnhertu   INTERVAL HISTORY: Heather [name] is a 73 y.o. with above-mentioned history of metastatic breast cancercurrently onchemotherapy with trastuzumab-Deruxtecan (Enhertu) every 4 weeks. She presents to the clinic today for treatment.  Her major complaints of fatigue, hair loss.  REVIEW OF SYSTEMS:   Constitutional: Denies fevers, chills or abnormal weight loss, complains of fatigue Eyes: Denies blurriness of vision Ears, nose, mouth, throat, and face: Denies mucositis or sore throat Respiratory: Denies cough, dyspnea or wheezes Cardiovascular: Denies palpitation, chest discomfort Gastrointestinal: Denies nausea, heartburn or change in bowel habits Skin: Denies abnormal skin rashes Lymphatics: Denies new lymphadenopathy or easy bruising Neurological: Denies numbness, tingling or new weaknesses Behavioral/Psych: Mood is stable, no new changes  Extremities: Mild lower extremity edema Breast: denies any pain or lumps or nodules in either breasts All other systems were reviewed with the patient and are negative.  I have reviewed the past medical history, past surgical history, social history and family history with the patient and they are unchanged from previous note.  ALLERGIES:  is allergic to other; acyclovir and related; chlorhexidine; zanaflex [tizanidine hcl]; codeine; keflex [cephalexin]; naprosyn [naproxen]; oxycodone; tramadol; and tussin [guaifenesin].  MEDICATIONS:  Current Outpatient Medications  Medication Sig Dispense Refill  . ALPRAZolam (XANAX) 0.25 MG tablet Take 1 tablet by mouth as needed.    . Calcium Carbonate Antacid (TUMS PO) Take 2 tablets by  mouth 2 (two) times daily as needed (acid reflux).    . Cholecalciferol (VITAMIN D) 2000 UNITS tablet Take 2,000 Units by mouth daily.    . cycloSPORINE (RESTASIS) 0.05 % ophthalmic emulsion 1 drop 2 (two) times daily.    Marland Kitchen  gabapentin (NEURONTIN) 600 MG tablet Take 1 tablet (600 mg total) by mouth as directed. Take 1 tablet in the AM and afternoon and 1 1/2 tabs at bedtime (Patient taking differently: Take 600 mg by mouth 3 (three) times daily. Take 1 tablet in the AM and afternoon and 1 1/2 tabs at bedtime) 315 tablet 2  . HYDROcodone-acetaminophen (NORCO/VICODIN) 5-325 MG tablet Take 1 tablet by mouth every 6 (six) hours as needed for moderate pain. 30 tablet 0  . lidocaine-prilocaine (EMLA) cream Apply to affected area once (Patient taking differently: Apply 1 application topically every 30 (thirty) days. Apply to port prior to infusions) 30 g 3  . omeprazole (PRILOSEC) 40 MG capsule TAKE 1 CAPSULE BY MOUTH ONCE DAILY FOR 30 DAYS  5  . ondansetron (ZOFRAN) 8 MG tablet Take 1 tablet (8 mg total) by mouth every 8 (eight) hours as needed for nausea or vomiting. 20 tablet 0  . OVER THE COUNTER MEDICATION Place 1 drop into both eyes 2 (two) times daily as needed (dry eyes). Over the counter lubricating eye drops    . polyethylene glycol (MIRALAX / GLYCOLAX) packet Take 17 g by mouth daily as needed.    . Probiotic Product (PROBIOTIC DAILY PO) Take 1 capsule by mouth daily.     . prochlorperazine (COMPAZINE) 10 MG tablet Take 1 tablet (10 mg total) by mouth every 6 (six) hours as needed for nausea or vomiting. 60 tablet 3  . spironolactone (ALDACTONE) 25 MG tablet Take 25 mg by mouth daily.     No current facility-administered medications for this visit.     PHYSICAL EXAMINATION: ECOG PERFORMANCE STATUS: 1 - Symptomatic but completely ambulatory  Vitals:   02/15/19 0912  BP: (!) 141/70  Pulse: 65  Resp: 18  Temp: 97.8 F (36.6 C)  SpO2: 99%   Filed Weights   02/15/19 0912  Weight: 126 lb 4.8 oz (57.3 kg)    GENERAL: alert, no distress and comfortable SKIN: skin color, texture, turgor are normal, no rashes or significant lesions EYES: normal, Conjunctiva are pink and non-injected, sclera clear OROPHARYNX:  no exudate, no erythema and lips, buccal mucosa, and tongue normal  NECK: supple, thyroid normal size, non-tender, without nodularity LYMPH: no palpable lymphadenopathy in the cervical, axillary or inguinal LUNGS: clear to auscultation and percussion with normal breathing effort HEART: regular rate & rhythm and no murmurs and no lower extremity edema ABDOMEN: abdomen soft, non-tender and normal bowel sounds MUSCULOSKELETAL: no cyanosis of digits and no clubbing  NEURO: alert & oriented x 3 with fluent speech, no focal motor/sensory deficits EXTREMITIES: No lower extremity edema  LABORATORY DATA:  I have reviewed the data as listed CMP Latest Ref Rng & Units 01/18/2019 12/28/2018 12/07/2018  Glucose 70 - 99 mg/dL 95 119(H) 100(H)  BUN 8 - 23 mg/dL '15 10 12  '$ Creatinine 0.44 - 1.00 mg/dL 0.82 0.97 0.76  Sodium 135 - 145 mmol/L 140 139 141  Potassium 3.5 - 5.1 mmol/L 3.8 3.6 4.0  Chloride 98 - 111 mmol/L 105 106 106  CO2 22 - 32 mmol/L '25 23 27  '$ Calcium 8.9 - 10.3 mg/dL 10.2 9.3 9.7  Total Protein 6.5 - 8.1 g/dL 7.3 7.0 7.0  Total Bilirubin  0.3 - 1.2 mg/dL 0.9 0.6 0.6  Alkaline Phos 38 - 126 U/L 96 111 98  AST 15 - 41 U/L 43(H) 35 35  ALT 0 - 44 U/L '20 20 20    '$ Lab Results  Component Value Date   WBC 3.2 (L) 02/15/2019   HGB 12.2 02/15/2019   HCT 36.7 02/15/2019   MCV 95.3 02/15/2019   PLT 61 (L) 02/15/2019   NEUTROABS 2.4 02/15/2019    ASSESSMENT & PLAN:  Primary cancer of upper outer quadrant of right female breast ([place]) Right breast invasive ductal carcinoma ER positive PR negative HER-2 positive Ki-67 44% multifocal disease 3/7 lymph nodes positive T2 N1 M0 stage IIB status post adjuvant chemotherapy with TCH followed by Herceptin maintenance, adjuvant radiation therapy and Letrozole. MRI 02/14/2015: T5 large destructive lesion with pathologic compression fracture and extensive epidural tumor, involvement of T4, excision metastatic carcinoma ER 60%, PR 0%, HER-2 positive ratio  2.71 average copy #7.45 03/27/2015: T4-6 decompression surgery for spinal cord compression PET-CT 12/10/17Rt para-spinal mass mildy hypermetabolic SUV 5.9, lytic cortical lesion 4.8 cm lesion left prox femur suv 8.8 favor osseus met disease Echo 04/12/2017 shows well preserved ejection fraction of 55-60%  Treatment summary: 1. Resumed anastrozole 05/21/2015, but it has caused significant hair loss so we switched her to exemestane 25 mg once daily 07/23/2015 2. Herceptin every 3 week started 04/10/2015 3. Bone metastases: Zometa every 3 months 4. Radiation therapy to the spine (Dr. [name]) 04/13/2015 to 05/19/2015 5. Radiation therapy to left femur 04/28/2016 to 05/12/2016  6. SRS to the brain lesion 11/24/2016 7.Kadcyla started 05/05/2017 discontinued 07/27/2017 8.Palbociclib +Faslodex +trastuzumab+ Pertuzumabstarted 08/10/2017, stopped 04/04/2018 9. Enhertu started 06/01/18 -------------------------------------------------------------------------------------------------------------------------- XRT to T123/04/2019 PET CT scan 10/03/2018:stable, improvement in subcutaneous mets MRI brain 01/17/2019: no intraparenchymal metastases, calvarial metastases stable  Current treatment:Cycle13day 1Enhertu (Fam-trastuzumab-Durextecan) Echo 04/09/2018 shows EF of 55-60%.  Toxicities: 1. Severe fatigue-improved with dose reduction 2. Pain: controlled right now with Gabapentin. 3.Hair loss 4. Thrombocytopenia: plt count 79.Dose reduced with cycle 7.  Currently on every 4-week Enhertu We also plan to do scans every 6 months which will be done in November 2020.    No orders of the defined types were placed in this encounter.  The patient has a good understanding of the overall plan. she agrees with it. she will call with any problems that may develop before the next visit here.  [name], MD 02/15/2019  Julious Oka Dorshimer am acting as scribe for Dr. [name].  I  have reviewed the above documentation for accuracy and completeness, and I agree with the above.

## 2019-02-15 ENCOUNTER — Inpatient Hospital Stay: Payer: Medicare Other

## 2019-02-15 ENCOUNTER — Inpatient Hospital Stay (HOSPITAL_BASED_OUTPATIENT_CLINIC_OR_DEPARTMENT_OTHER): Payer: Medicare Other | Admitting: Hematology and Oncology

## 2019-02-15 ENCOUNTER — Other Ambulatory Visit: Payer: Self-pay

## 2019-02-15 ENCOUNTER — Inpatient Hospital Stay: Payer: Medicare Other | Attending: Hematology and Oncology

## 2019-02-15 DIAGNOSIS — Z5112 Encounter for antineoplastic immunotherapy: Secondary | ICD-10-CM | POA: Diagnosis not present

## 2019-02-15 DIAGNOSIS — Z923 Personal history of irradiation: Secondary | ICD-10-CM | POA: Diagnosis not present

## 2019-02-15 DIAGNOSIS — C50411 Malignant neoplasm of upper-outer quadrant of right female breast: Secondary | ICD-10-CM | POA: Diagnosis not present

## 2019-02-15 DIAGNOSIS — D696 Thrombocytopenia, unspecified: Secondary | ICD-10-CM | POA: Insufficient documentation

## 2019-02-15 DIAGNOSIS — C7951 Secondary malignant neoplasm of bone: Secondary | ICD-10-CM

## 2019-02-15 DIAGNOSIS — Z9011 Acquired absence of right breast and nipple: Secondary | ICD-10-CM | POA: Insufficient documentation

## 2019-02-15 DIAGNOSIS — Z9221 Personal history of antineoplastic chemotherapy: Secondary | ICD-10-CM | POA: Diagnosis not present

## 2019-02-15 DIAGNOSIS — Z95828 Presence of other vascular implants and grafts: Secondary | ICD-10-CM

## 2019-02-15 DIAGNOSIS — Z170 Estrogen receptor positive status [ER+]: Secondary | ICD-10-CM | POA: Insufficient documentation

## 2019-02-15 DIAGNOSIS — G9529 Other cord compression: Secondary | ICD-10-CM

## 2019-02-15 DIAGNOSIS — C50911 Malignant neoplasm of unspecified site of right female breast: Secondary | ICD-10-CM

## 2019-02-15 LAB — CMP (CANCER CENTER ONLY)
ALT: 24 U/L (ref 0–44)
AST: 37 U/L (ref 15–41)
Albumin: 3.7 g/dL (ref 3.5–5.0)
Alkaline Phosphatase: 118 U/L (ref 38–126)
Anion gap: 9 (ref 5–15)
BUN: 11 mg/dL (ref 8–23)
CO2: 25 mmol/L (ref 22–32)
Calcium: 9.7 mg/dL (ref 8.9–10.3)
Chloride: 104 mmol/L (ref 98–111)
Creatinine: 0.74 mg/dL (ref 0.44–1.00)
GFR, Est AFR Am: >60 mL/min (ref >60)
GFR, Estimated: >60 mL/min (ref >60)
Glucose, Bld: 103 mg/dL — ABNORMAL HIGH (ref 70–99)
Potassium: 3.9 mmol/L (ref 3.5–5.1)
Sodium: 138 mmol/L (ref 135–145)
Total Bilirubin: 0.7 mg/dL (ref 0.3–1.2)
Total Protein: 7 g/dL (ref 6.5–8.1)

## 2019-02-15 LAB — CBC WITH DIFFERENTIAL (CANCER CENTER ONLY)
Abs Immature Granulocytes: 0 10*3/uL (ref 0.00–0.07)
Basophils Absolute: 0 10*3/uL (ref 0.0–0.1)
Basophils Relative: 1 %
Eosinophils Absolute: 0.1 10*3/uL (ref 0.0–0.5)
Eosinophils Relative: 2 %
HCT: 36.7 % (ref 36.0–46.0)
Hemoglobin: 12.2 g/dL (ref 12.0–15.0)
Immature Granulocytes: 0 %
Lymphocytes Relative: 10 %
Lymphs Abs: 0.3 10*3/uL — ABNORMAL LOW (ref 0.7–4.0)
MCH: 31.7 pg (ref 26.0–34.0)
MCHC: 33.2 g/dL (ref 30.0–36.0)
MCV: 95.3 fL (ref 80.0–100.0)
Monocytes Absolute: 0.3 10*3/uL (ref 0.1–1.0)
Monocytes Relative: 10 %
Neutro Abs: 2.4 10*3/uL (ref 1.7–7.7)
Neutrophils Relative %: 77 %
Platelet Count: 61 10*3/uL — ABNORMAL LOW (ref 150–400)
RBC: 3.85 MIL/uL — ABNORMAL LOW (ref 3.87–5.11)
RDW: 14.6 % (ref 11.5–15.5)
WBC Count: 3.2 10*3/uL — ABNORMAL LOW (ref 4.0–10.5)
nRBC: 0 % (ref 0.0–0.2)

## 2019-02-15 MED ORDER — ACETAMINOPHEN 325 MG PO TABS
ORAL_TABLET | ORAL | Status: AC
  Filled 2019-02-15: qty 2

## 2019-02-15 MED ORDER — CYANOCOBALAMIN 1000 MCG/ML IJ SOLN
INTRAMUSCULAR | Status: AC
  Filled 2019-02-15: qty 1

## 2019-02-15 MED ORDER — HEPARIN SOD (PORK) LOCK FLUSH 100 UNIT/ML IV SOLN
500.0000 [IU] | Freq: Once | INTRAVENOUS | Status: AC | PRN
  Administered 2019-02-15: 500 [IU]
  Filled 2019-02-15: qty 5

## 2019-02-15 MED ORDER — DEXTROSE 5 % IV SOLN
Freq: Once | INTRAVENOUS | Status: AC
  Administered 2019-02-15: 11:00:00 via INTRAVENOUS
  Filled 2019-02-15: qty 250

## 2019-02-15 MED ORDER — PALONOSETRON HCL INJECTION 0.25 MG/5ML
INTRAVENOUS | Status: AC
  Filled 2019-02-15: qty 5

## 2019-02-15 MED ORDER — ZOLEDRONIC ACID 4 MG/100ML IV SOLN
4.0000 mg | Freq: Once | INTRAVENOUS | Status: AC
  Administered 2019-02-15: 4 mg via INTRAVENOUS
  Filled 2019-02-15: qty 100

## 2019-02-15 MED ORDER — CYANOCOBALAMIN 1000 MCG/ML IJ SOLN
1000.0000 ug | Freq: Once | INTRAMUSCULAR | Status: AC
  Administered 2019-02-15: 1000 ug via INTRAMUSCULAR

## 2019-02-15 MED ORDER — ACETAMINOPHEN 325 MG PO TABS
650.0000 mg | ORAL_TABLET | Freq: Once | ORAL | Status: AC
  Administered 2019-02-15: 650 mg via ORAL

## 2019-02-15 MED ORDER — PALONOSETRON HCL INJECTION 0.25 MG/5ML
0.2500 mg | Freq: Once | INTRAVENOUS | Status: AC
  Administered 2019-02-15: 0.25 mg via INTRAVENOUS

## 2019-02-15 MED ORDER — FAM-TRASTUZUMAB DERUXTECAN-NXKI CHEMO 100 MG IV SOLR
200.0000 mg | Freq: Once | INTRAVENOUS | Status: AC
  Administered 2019-02-15: 200 mg via INTRAVENOUS
  Filled 2019-02-15: qty 10

## 2019-02-15 MED ORDER — SODIUM CHLORIDE 0.9% FLUSH
10.0000 mL | INTRAVENOUS | Status: DC | PRN
  Administered 2019-02-15: 10 mL
  Filled 2019-02-15: qty 10

## 2019-02-15 MED ORDER — SODIUM CHLORIDE 0.9% FLUSH
10.0000 mL | Freq: Once | INTRAVENOUS | Status: AC
  Administered 2019-02-15: 09:00:00 10 mL
  Filled 2019-02-15: qty 10

## 2019-02-15 MED ORDER — SODIUM CHLORIDE 0.9 % IV SOLN
Freq: Once | INTRAVENOUS | Status: DC
  Filled 2019-02-15: qty 250

## 2019-02-15 NOTE — Assessment & Plan Note (Signed)
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 MRI 02/14/2015: T5 large destructive lesion with pathologic compression fracture and extensive epidural tumor, involvement of T4, excision metastatic carcinoma ER 60%, PR 0%, HER-2 positive ratio 2.71 average copy #7.45 03/27/2015: T4-6 decompression surgery for spinal cord compression PET-CT 12/10/17Rt para-spinal mass mildy hypermetabolic SUV 5.9, lytic cortical lesion 4.8 cm lesion left prox femur suv 8.8 favor osseus met disease Echo 04/12/2017 shows well preserved ejection fraction of 55-60%  Treatment summary: 1. Resumed anastrozole 05/21/2015, but it has caused significant hair loss so we switched her to exemestane 25 mg once daily 07/23/2015 2. Herceptin every 3 week started 04/10/2015 3. Bone metastases: Zometa every 3 months 4. Radiation therapy to the spine (Dr. [name]) 04/13/2015 to 05/19/2015 5. Radiation therapy to left femur 04/28/2016 to 05/12/2016  6. SRS to the brain lesion 11/24/2016 7.Kadcyla started 05/05/2017 discontinued 07/27/2017 8.Palbociclib +Faslodex +trastuzumab+ Pertuzumabstarted 08/10/2017, stopped 04/04/2018 9. Enhertu started 06/01/18 -------------------------------------------------------------------------------------------------------------------------- XRT to T123/04/2019 PET CT scan 10/03/2018:stable, improvement in subcutaneous mets MRI brain 01/17/2019: no intraparenchymal metastases, calvarial metastases stable  Current treatment:Cycle13day 1Enhertu (Fam-trastuzumab-Durextecan) Echo 04/09/2018 shows EF of 55-60%.  Toxicities: 1. Severe fatigue-improved with dose reduction 2. Pain: controlled right now with Gabapentin. 3.Hair loss 4. Thrombocytopenia: plt count 79.Dose  reduced with cycle 7.  Currently on every 4-week Enhertu We also plan to do scans every 6 months which will be done in November 2020.

## 2019-02-15 NOTE — Patient Instructions (Signed)
[facility] Discharge Instructions for Patients Receiving Chemotherapy  Today you received the following chemotherapy agents: Enhertu  To help prevent nausea and vomiting after your treatment, we encourage you to take your nausea medication as directed.   If you develop nausea and vomiting that is not controlled by your nausea medication, call the clinic.   BELOW ARE SYMPTOMS THAT SHOULD BE REPORTED IMMEDIATELY:  *FEVER GREATER THAN 100.5 F  *CHILLS WITH OR WITHOUT FEVER  NAUSEA AND VOMITING THAT IS NOT CONTROLLED WITH YOUR NAUSEA MEDICATION  *UNUSUAL SHORTNESS OF BREATH  *UNUSUAL BRUISING OR BLEEDING  TENDERNESS IN MOUTH AND THROAT WITH OR WITHOUT PRESENCE OF ULCERS  *URINARY PROBLEMS  *BOWEL PROBLEMS  UNUSUAL RASH Items with * indicate a potential emergency and should be followed up as soon as possible.  Feel free to call the clinic should you have any questions or concerns. The clinic phone number is [phone].  Please show the CHEMO ALERT CARD at check-in to the Emergency Department and triage nurse.   

## 2019-02-15 NOTE — Progress Notes (Signed)
Per [name] okay to treat with plt 61

## 2019-03-14 NOTE — Progress Notes (Signed)
Patient Care Team: [name], MD as PCP - General (Family Medicine)  DIAGNOSIS:    ICD-10-CM   1. Metastatic cancer to spine ([place])  C79.51   2. Primary cancer of upper outer quadrant of right female breast ([place])  C50.411     SUMMARY OF ONCOLOGIC HISTORY: Oncology History  Primary cancer of upper outer quadrant of right female breast ([place])  03/08/2012 Initial Diagnosis   Right breast invasive ductal carcinoma ER positive PR negative HER-2 positive Ki-67 44%; another breast mass biopsied in the anterior part of the breast which was also positive for malignancy that was HER-2 negative   03/14/2012 Cancer Staging   Staging form: Breast, AJCC 7th Edition - Pathologic: Stage IV (M1) - Signed by [name], NP on 06/22/2018   05/10/2012 Surgery   Right mastectomy and axillary lymph node dissection: Multifocal disease 5 cm, 1.7 cm, 1.6 cm, ER positive PR negative HER-2 positive Ki-67 44%, 3/7 lymph nodes positive   06/14/2012 - 06/12/2013 Chemotherapy   Adjuvant chemotherapy with [place] 6 followed by Herceptin maintenance   10/25/2012 - 12/11/2012 Radiation Therapy   Adjuvant radiation therapy   02/07/2013 -  Anti-estrogen oral therapy   Letrozole 2.5 mg daily   02/14/2015 Imaging   MRI spine: Large destructive T5 lesion with severe pathologic compression fracture and extensive epidural tumor, severe spinal stenosis with moderate cord compression, tumor involvement of T4   03/18/2015 PET scan   Residual enhancing soft tissue adjacent to the spinal cord. No evidence of metastatic disease. Nonspecific uptake the left nipple   03/27/2015 - 03/30/2015 Hospital Admission   T4-6 decompression for spinal cord compression (lower extremity paralysis)   04/10/2015 -  Chemotherapy   Palliative treatment with Herceptin every 3 weeks with letrozole 2.5 mg daily   04/13/2015 - 05/19/2015 Radiation Therapy   Palliative radiation treatment to the spine   09/01/2015 Imaging   CT chest  abdomen pelvis: Pathologic fracture with posterior fusion at T5 no other evidence of metastatic disease in the chest abdomen pelvis   12/23/2015 Imaging   CT CAP: new nonspecific 0.6 cm lymph node was stated mediastinum needs follow-up CT, innumerable tiny groundglass pulmonary nodules throughout both lungs unchanged, patchy consolidation from radiation, T5 fracture, no mets   04/15/2016 PET scan   Rt para-spinal mass mildy hypermetabolic SUV 5.9, lytic cortical lesion 4.8 cm lesion left prox femur suv 8.8 favor osseus met disease   04/28/2016 - 05/12/2016 Radiation Therapy   Palliative XRT Left femur   05/24/2016 Procedure   Soft tissue mass biopsy back: Metastatic breast cancer ER 80%, PR 0%, Ki-67 50%, HER-2 positive ratio 2.25   06/09/2016 - 03/29/2017 Anti-estrogen oral therapy   Faslodex with Herceptin and Perjeta every 4 weeks   06/13/2016 - 06/15/2016 Hospital Admission   uncomplicated revision of previous thoracic instrumentation.   10/10/2016 PET scan   Interval development of new hypermetabolic 2.5 x 1.1 cm lesion posterior right eighth rib consistent with metastatic disease, stable right paraspinal lesion at T8, clustered soft tissue nodules in the posterior midline back near cervicothoracic junction smaller than before    11/03/2016 - 11/07/2016 Radiation Therapy   Palliative radiation to right eighth rib   11/04/2016 Imaging   Solitary contrast-enhancing lesion in the right cerebellum 6 x 2 mm concerning for metastatic lesion   11/24/2016 - 11/24/2016 Radiation Therapy   SRS to the brain lesion   04/18/2017 PET scan   New ill-defined rounded airspace opacity in posterior right lower lobe.  Differential diagnosis includes malignancy and infectious/inflammatory process.Increased hypermetabolic lytic lesion in T8 vertebral body, consistent with bone metastasis.  Increased hypermetabolic posterior upper chest wall subcutaneous nodules, highly suspicious metastatic disease. Further  improvement in right posterior eighth rib metastasis.   05/04/2017 - 07/27/2017 Chemotherapy   Kadcyla every 3 weeks palliative chemotherapy stopped for progression   07/26/2017 Imaging   Interval increase in size of the multiple enhancing masses posteriorly overlying the spine, interval development of left axillary and mediastinal adenopathy, increase in the size of lytic lesions in T8-T9 and left ninth rib, increased groundglass opacities right lower lobe lung   07/27/2017 - 05/11/2018 Chemotherapy   Palbociclib + Faslodex + trastuzumab + Pertuzumab     04/03/2018 PET scan   PET CT scan showed progression of disease not only in the bones but also in the subcutaneous nodules.   05/15/2018 - 05/25/2018 Radiation Therapy   Palliative radiation to the back   06/01/2018 -  Chemotherapy   Enhertu (Transtuzumab Deruxtecan)    08/07/2018 - 08/07/2018 Radiation Therapy   T12 vertebral body SRS   Carcinoma of right breast, stage 4 (H[facility])  02/14/2015 Initial Diagnosis   Breast cancer metastasized to bone, right ([place])   06/01/2018 -  Chemotherapy   The patient had palonosetron (ALOXI) injection 0.25 mg, 0.25 mg, Intravenous,  Once, 13 of 20 cycles Administration: 0.25 mg (06/01/2018), 0.25 mg (06/22/2018), 0.25 mg (08/24/2018), 0.25 mg (07/13/2018), 0.25 mg (08/03/2018), 0.25 mg (09/14/2018), 0.25 mg (10/05/2018), 0.25 mg (11/16/2018), 0.25 mg (10/26/2018), 0.25 mg (12/07/2018), 0.25 mg (12/28/2018), 0.25 mg (01/18/2019), 0.25 mg (02/15/2019) fam-trastuzumab deruxtecan-nxki (ENHERTU) 332 mg in dextrose 5 % 100 mL chemo infusion, 5.4 mg/kg = 332 mg, Intravenous,  Once, 13 of 20 cycles Dose modification: 4.4 mg/kg (original dose 5.4 mg/kg, Cycle 7, Reason: Provider Judgment), 3.2 mg/kg (original dose 5.4 mg/kg, Cycle 9, Reason: Dose not tolerated) Administration: 332 mg (06/01/2018), 332 mg (06/22/2018), 332 mg (07/13/2018), 332 mg (08/24/2018), 332 mg (08/03/2018), 332 mg (09/14/2018), 272 mg (10/05/2018), 200 mg (11/16/2018), 272  mg (10/26/2018), 200 mg (12/07/2018), 200 mg (12/28/2018), 200 mg (01/18/2019), 200 mg (02/15/2019)  for chemotherapy treatment.      CHIEF COMPLIANT: Follow-up of metastatic breast cancer onEnhertu  INTERVAL HISTORY: Heather [name] is a 73 y.o. with above-mentioned history of metastatic breast cancercurrently onchemotherapy with trastuzumab-Deruxtecan (Enhertu) every 4 weeks. She presents to the clinic today for treatment.  REVIEW OF SYSTEMS:   Constitutional: Denies fevers, chills or abnormal weight loss Eyes: Denies blurriness of vision Ears, nose, mouth, throat, and face: Denies mucositis or sore throat Respiratory: Denies cough, dyspnea or wheezes Cardiovascular: Denies palpitation, chest discomfort Gastrointestinal: Denies nausea, heartburn or change in bowel habits Skin: Denies abnormal skin rashes Lymphatics: Denies new lymphadenopathy or easy bruising Neurological: Denies numbness, tingling or new weaknesses Behavioral/Psych: Mood is stable, no new changes  Extremities: No lower extremity edema Breast: denies any pain or lumps or nodules in either breasts All other systems were reviewed with the patient and are negative.  I have reviewed the past medical history, past surgical history, social history and family history with the patient and they are unchanged from previous note.  ALLERGIES:  is allergic to other; acyclovir and related; chlorhexidine; zanaflex [tizanidine hcl]; codeine; keflex [cephalexin]; naprosyn [naproxen]; oxycodone; tramadol; and tussin [guaifenesin].  MEDICATIONS:  Current Outpatient Medications  Medication Sig Dispense Refill  . ALPRAZolam (XANAX) 0.25 MG tablet Take 1 tablet by mouth as needed.    . Calcium Carbonate Antacid (TUMS PO)  Take 2 tablets by mouth 2 (two) times daily as needed (acid reflux).    . Cholecalciferol (VITAMIN D) 2000 UNITS tablet Take 2,000 Units by mouth daily.    . cycloSPORINE (RESTASIS) 0.05 % ophthalmic emulsion 1 drop 2  (two) times daily.    Marland Kitchen gabapentin (NEURONTIN) 600 MG tablet Take 1 tablet (600 mg total) by mouth as directed. Take 1 tablet in the AM and afternoon and 1 1/2 tabs at bedtime (Patient taking differently: Take 600 mg by mouth 3 (three) times daily. Take 1 tablet in the AM and afternoon and 1 1/2 tabs at bedtime) 315 tablet 2  . HYDROcodone-acetaminophen (NORCO/VICODIN) 5-325 MG tablet Take 1 tablet by mouth every 6 (six) hours as needed for moderate pain. 30 tablet 0  . lidocaine-prilocaine (EMLA) cream Apply to affected area once (Patient taking differently: Apply 1 application topically every 30 (thirty) days. Apply to port prior to infusions) 30 g 3  . omeprazole (PRILOSEC) 40 MG capsule TAKE 1 CAPSULE BY MOUTH ONCE DAILY FOR 30 DAYS  5  . ondansetron (ZOFRAN) 8 MG tablet Take 1 tablet (8 mg total) by mouth every 8 (eight) hours as needed for nausea or vomiting. 20 tablet 0  . OVER THE COUNTER MEDICATION Place 1 drop into both eyes 2 (two) times daily as needed (dry eyes). Over the counter lubricating eye drops    . polyethylene glycol (MIRALAX / GLYCOLAX) packet Take 17 g by mouth daily as needed.    . Probiotic Product (PROBIOTIC DAILY PO) Take 1 capsule by mouth daily.     . prochlorperazine (COMPAZINE) 10 MG tablet Take 1 tablet (10 mg total) by mouth every 6 (six) hours as needed for nausea or vomiting. 60 tablet 3  . spironolactone (ALDACTONE) 25 MG tablet Take 25 mg by mouth daily.     No current facility-administered medications for this visit.     PHYSICAL EXAMINATION: ECOG PERFORMANCE STATUS: 1 - Symptomatic but completely ambulatory  Vitals:   03/15/19 1012  BP: (!) 144/56  Pulse: 74  Resp: 17  Temp: 98 F (36.7 C)  SpO2: 100%   Filed Weights   03/15/19 1012  Weight: 124 lb 6.4 oz (56.4 kg)    GENERAL: alert, no distress and comfortable SKIN: skin color, texture, turgor are normal, no rashes or significant lesions EYES: normal, Conjunctiva are pink and non-injected,  sclera clear OROPHARYNX: no exudate, no erythema and lips, buccal mucosa, and tongue normal  NECK: supple, thyroid normal size, non-tender, without nodularity LYMPH: no palpable lymphadenopathy in the cervical, axillary or inguinal LUNGS: clear to auscultation and percussion with normal breathing effort HEART: regular rate & rhythm and no murmurs and no lower extremity edema ABDOMEN: abdomen soft, non-tender and normal bowel sounds MUSCULOSKELETAL: no cyanosis of digits and no clubbing  NEURO: alert & oriented x 3 with fluent speech, no focal motor/sensory deficits EXTREMITIES: No lower extremity edema  LABORATORY DATA:  I have reviewed the data as listed CMP Latest Ref Rng & Units 02/15/2019 01/18/2019 12/28/2018  Glucose 70 - 99 mg/dL 103(H) 95 119(H)  BUN 8 - 23 mg/dL '11 15 10  ' Creatinine 0.44 - 1.00 mg/dL 0.74 0.82 0.97  Sodium 135 - 145 mmol/L 138 140 139  Potassium 3.5 - 5.1 mmol/L 3.9 3.8 3.6  Chloride 98 - 111 mmol/L 104 105 106  CO2 22 - 32 mmol/L '25 25 23  ' Calcium 8.9 - 10.3 mg/dL 9.7 10.2 9.3  Total Protein 6.5 - 8.1 g/dL 7.0 7.3  7.0  Total Bilirubin 0.3 - 1.2 mg/dL 0.7 0.9 0.6  Alkaline Phos 38 - 126 U/L 118 96 111  AST 15 - 41 U/L 37 43(H) 35  ALT 0 - 44 U/L '24 20 20    ' Lab Results  Component Value Date   WBC 3.2 (L) 02/15/2019   HGB 12.2 02/15/2019   HCT 36.7 02/15/2019   MCV 95.3 02/15/2019   PLT 61 (L) 02/15/2019   NEUTROABS 2.4 02/15/2019    ASSESSMENT & PLAN:  Primary cancer of upper outer quadrant of right female breast ([place]) Right breast invasive ductal carcinoma ER positive PR negative HER-2 positive Ki-67 44% multifocal disease 3/7 lymph nodes positive T2 N1 M0 stage IIB status post adjuvant chemotherapy with TCH followed by Herceptin maintenance, adjuvant radiation therapy and Letrozole. MRI 02/14/2015: T5 large destructive lesion with pathologic compression fracture and extensive epidural tumor, involvement of T4, excision metastatic carcinoma ER 60%, PR  0%, HER-2 positive ratio 2.71 average copy #7.45 03/27/2015: T4-6 decompression surgery for spinal cord compression PET-CT 12/10/17Rt para-spinal mass mildy hypermetabolic SUV 5.9, lytic cortical lesion 4.8 cm lesion left prox femur suv 8.8 favor osseus met disease Echo 04/12/2017 shows well preserved ejection fraction of 55-60%  Treatment summary: 1. Resumed anastrozole 05/21/2015, but it has caused significant hair loss so we switched her to exemestane 25 mg once daily 07/23/2015 2. Herceptin every 3 week started 04/10/2015 3. Bone metastases: Zometa every 3 months 4. Radiation therapy to the spine (Dr. [name]) 04/13/2015 to 05/19/2015 5. Radiation therapy to left femur 04/28/2016 to 05/12/2016  6. SRS to the brain lesion 11/24/2016 7.Kadcyla started 05/05/2017 discontinued 07/27/2017 8.Palbociclib +Faslodex +trastuzumab+ Pertuzumabstarted 08/10/2017, stopped 04/04/2018 9. Enhertu started 06/01/18 -------------------------------------------------------------------------------------------------------------------------- XRT to T123/04/2019 PET CT scan 10/03/2018:stable, improvement in subcutaneous mets MRI brain9/02/2019: no intraparenchymal metastases, calvarial metastases stable  Current treatment:Cycle14day 1Enhertu (Fam-trastuzumab-Durextecan) Echo 04/09/2018 shows EF of 55-60%.  Toxicities: 1. Severe fatigue-improved with dose reduction 2. Pain: controlled right now with Gabapentin. 3.Hair loss 4. Thrombocytopenia: plt count 79.Dose reduced with cycle 7. 5.  Right upper lung discomfort and sharp jabs of pain which subsided after 2 days.  I told her that if she the symptoms recur then we will do a CT angiogram to look for PE.  Currently on every 4-weekEnhertu We also plan to do scans every 6 months which will be done in February 2021.    No orders of the defined types were placed in this encounter.  The patient has a good understanding of the overall plan.  she agrees with it. she will call with any problems that may develop before the next visit here.  [name], MD 03/15/2019  Julious Oka Dorshimer am acting as scribe for Dr. [name].  I have reviewed the above documentation for accuracy and completeness, and I agree with the above.

## 2019-03-15 ENCOUNTER — Inpatient Hospital Stay (HOSPITAL_BASED_OUTPATIENT_CLINIC_OR_DEPARTMENT_OTHER): Payer: Medicare Other | Admitting: Hematology and Oncology

## 2019-03-15 ENCOUNTER — Other Ambulatory Visit: Payer: Self-pay

## 2019-03-15 ENCOUNTER — Inpatient Hospital Stay: Payer: Medicare Other | Attending: Hematology and Oncology

## 2019-03-15 ENCOUNTER — Inpatient Hospital Stay: Payer: Medicare Other

## 2019-03-15 VITALS — BP 144/56 | HR 74 | Temp 98.0°F | Resp 17 | Ht 64.0 in | Wt 124.4 lb

## 2019-03-15 DIAGNOSIS — C7951 Secondary malignant neoplasm of bone: Secondary | ICD-10-CM | POA: Diagnosis not present

## 2019-03-15 DIAGNOSIS — C50411 Malignant neoplasm of upper-outer quadrant of right female breast: Secondary | ICD-10-CM

## 2019-03-15 DIAGNOSIS — Z170 Estrogen receptor positive status [ER+]: Secondary | ICD-10-CM | POA: Insufficient documentation

## 2019-03-15 DIAGNOSIS — Z9221 Personal history of antineoplastic chemotherapy: Secondary | ICD-10-CM | POA: Insufficient documentation

## 2019-03-15 DIAGNOSIS — G9529 Other cord compression: Secondary | ICD-10-CM

## 2019-03-15 DIAGNOSIS — Z95828 Presence of other vascular implants and grafts: Secondary | ICD-10-CM

## 2019-03-15 DIAGNOSIS — Z79811 Long term (current) use of aromatase inhibitors: Secondary | ICD-10-CM | POA: Diagnosis not present

## 2019-03-15 DIAGNOSIS — D696 Thrombocytopenia, unspecified: Secondary | ICD-10-CM | POA: Diagnosis not present

## 2019-03-15 DIAGNOSIS — Z5112 Encounter for antineoplastic immunotherapy: Secondary | ICD-10-CM | POA: Diagnosis present

## 2019-03-15 DIAGNOSIS — G893 Neoplasm related pain (acute) (chronic): Secondary | ICD-10-CM | POA: Insufficient documentation

## 2019-03-15 DIAGNOSIS — Z9011 Acquired absence of right breast and nipple: Secondary | ICD-10-CM | POA: Diagnosis not present

## 2019-03-15 DIAGNOSIS — C50911 Malignant neoplasm of unspecified site of right female breast: Secondary | ICD-10-CM

## 2019-03-15 LAB — CMP (CANCER CENTER ONLY)
ALT: 22 U/L (ref 0–44)
AST: 33 U/L (ref 15–41)
Albumin: 3.6 g/dL (ref 3.5–5.0)
Alkaline Phosphatase: 130 U/L — ABNORMAL HIGH (ref 38–126)
Anion gap: 11 (ref 5–15)
BUN: 13 mg/dL (ref 8–23)
CO2: 26 mmol/L (ref 22–32)
Calcium: 9.9 mg/dL (ref 8.9–10.3)
Chloride: 102 mmol/L (ref 98–111)
Creatinine: 0.77 mg/dL (ref 0.44–1.00)
GFR, Est AFR Am: >60 mL/min (ref >60)
GFR, Estimated: >60 mL/min (ref >60)
Glucose, Bld: 157 mg/dL — ABNORMAL HIGH (ref 70–99)
Potassium: 3.7 mmol/L (ref 3.5–5.1)
Sodium: 139 mmol/L (ref 135–145)
Total Bilirubin: 0.7 mg/dL (ref 0.3–1.2)
Total Protein: 7 g/dL (ref 6.5–8.1)

## 2019-03-15 LAB — CBC WITH DIFFERENTIAL (CANCER CENTER ONLY)
Abs Immature Granulocytes: 0 10*3/uL (ref 0.00–0.07)
Basophils Absolute: 0 10*3/uL (ref 0.0–0.1)
Basophils Relative: 1 %
Eosinophils Absolute: 0.1 10*3/uL (ref 0.0–0.5)
Eosinophils Relative: 2 %
HCT: 36 % (ref 36.0–46.0)
Hemoglobin: 11.9 g/dL — ABNORMAL LOW (ref 12.0–15.0)
Immature Granulocytes: 0 %
Lymphocytes Relative: 11 %
Lymphs Abs: 0.3 10*3/uL — ABNORMAL LOW (ref 0.7–4.0)
MCH: 31.5 pg (ref 26.0–34.0)
MCHC: 33.1 g/dL (ref 30.0–36.0)
MCV: 95.2 fL (ref 80.0–100.0)
Monocytes Absolute: 0.3 10*3/uL (ref 0.1–1.0)
Monocytes Relative: 11 %
Neutro Abs: 1.8 10*3/uL (ref 1.7–7.7)
Neutrophils Relative %: 75 %
Platelet Count: 77 10*3/uL — ABNORMAL LOW (ref 150–400)
RBC: 3.78 MIL/uL — ABNORMAL LOW (ref 3.87–5.11)
RDW: 14.7 % (ref 11.5–15.5)
WBC Count: 2.5 10*3/uL — ABNORMAL LOW (ref 4.0–10.5)
nRBC: 0 % (ref 0.0–0.2)

## 2019-03-15 MED ORDER — SODIUM CHLORIDE 0.9% FLUSH
3.0000 mL | Freq: Once | INTRAVENOUS | Status: DC | PRN
  Filled 2019-03-15: qty 10

## 2019-03-15 MED ORDER — SODIUM CHLORIDE 0.9% FLUSH
10.0000 mL | INTRAVENOUS | Status: DC | PRN
  Administered 2019-03-15: 12:00:00 10 mL
  Filled 2019-03-15: qty 10

## 2019-03-15 MED ORDER — PALONOSETRON HCL INJECTION 0.25 MG/5ML
INTRAVENOUS | Status: AC
  Filled 2019-03-15: qty 5

## 2019-03-15 MED ORDER — HEPARIN SOD (PORK) LOCK FLUSH 100 UNIT/ML IV SOLN
500.0000 [IU] | Freq: Once | INTRAVENOUS | Status: AC | PRN
  Administered 2019-03-15: 12:00:00 500 [IU]
  Filled 2019-03-15: qty 5

## 2019-03-15 MED ORDER — ACETAMINOPHEN 325 MG PO TABS
ORAL_TABLET | ORAL | Status: AC
  Filled 2019-03-15: qty 2

## 2019-03-15 MED ORDER — CYANOCOBALAMIN 1000 MCG/ML IJ SOLN
INTRAMUSCULAR | Status: AC
  Filled 2019-03-15: qty 1

## 2019-03-15 MED ORDER — FAM-TRASTUZUMAB DERUXTECAN-NXKI CHEMO 100 MG IV SOLR
200.0000 mg | Freq: Once | INTRAVENOUS | Status: AC
  Administered 2019-03-15: 200 mg via INTRAVENOUS
  Filled 2019-03-15: qty 10

## 2019-03-15 MED ORDER — PALONOSETRON HCL INJECTION 0.25 MG/5ML
0.2500 mg | Freq: Once | INTRAVENOUS | Status: AC
  Administered 2019-03-15: 0.25 mg via INTRAVENOUS

## 2019-03-15 MED ORDER — ACETAMINOPHEN 325 MG PO TABS
650.0000 mg | ORAL_TABLET | Freq: Once | ORAL | Status: AC
  Administered 2019-03-15: 650 mg via ORAL

## 2019-03-15 MED ORDER — SODIUM CHLORIDE 0.9% FLUSH
10.0000 mL | Freq: Once | INTRAVENOUS | Status: AC
  Administered 2019-03-15: 10 mL
  Filled 2019-03-15: qty 10

## 2019-03-15 MED ORDER — DEXTROSE 5 % IV SOLN
Freq: Once | INTRAVENOUS | Status: AC
  Administered 2019-03-15: 11:00:00 via INTRAVENOUS
  Filled 2019-03-15: qty 250

## 2019-03-15 MED ORDER — CYANOCOBALAMIN 1000 MCG/ML IJ SOLN
1000.0000 ug | Freq: Once | INTRAMUSCULAR | Status: AC
  Administered 2019-03-15: 1000 ug via INTRAMUSCULAR

## 2019-03-15 NOTE — Patient Instructions (Addendum)

## 2019-03-15 NOTE — Assessment & Plan Note (Signed)
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 MRI 02/14/2015: T5 large destructive lesion with pathologic compression fracture and extensive epidural tumor, involvement of T4, excision metastatic carcinoma ER 60%, PR 0%, HER-2 positive ratio 2.71 average copy #7.45 03/27/2015: T4-6 decompression surgery for spinal cord compression PET-CT 12/10/17Rt para-spinal mass mildy hypermetabolic SUV 5.9, lytic cortical lesion 4.8 cm lesion left prox femur suv 8.8 favor osseus met disease Echo 04/12/2017 shows well preserved ejection fraction of 55-60%  Treatment summary: 1. Resumed anastrozole 05/21/2015, but it has caused significant hair loss so we switched her to exemestane 25 mg once daily 07/23/2015 2. Herceptin every 3 week started 04/10/2015 3. Bone metastases: Zometa every 3 months 4. Radiation therapy to the spine (Dr. [name]) 04/13/2015 to 05/19/2015 5. Radiation therapy to left femur 04/28/2016 to 05/12/2016  6. SRS to the brain lesion 11/24/2016 7.Kadcyla started 05/05/2017 discontinued 07/27/2017 8.Palbociclib +Faslodex +trastuzumab+ Pertuzumabstarted 08/10/2017, stopped 04/04/2018 9. Enhertu started 06/01/18 -------------------------------------------------------------------------------------------------------------------------- XRT to T123/04/2019 PET CT scan 10/03/2018:stable, improvement in subcutaneous mets MRI brain9/02/2019: no intraparenchymal metastases, calvarial metastases stable  Current treatment:Cycle14day 1Enhertu (Fam-trastuzumab-Durextecan) Echo 04/09/2018 shows EF of 55-60%.  Toxicities: 1. Severe fatigue-improved with dose reduction 2. Pain: controlled right now with Gabapentin. 3.Hair loss 4. Thrombocytopenia: plt count 79.Dose  reduced with cycle 7.  Currently on every 4-weekEnhertu We also plan to do scans every 6 months which will be done in February 2021.

## 2019-03-15 NOTE — Patient Instructions (Signed)
[facility] Discharge Instructions for Patients Receiving Chemotherapy  Today you received the following chemotherapy agents Fam-trastuzumab [place]).  To help prevent nausea and vomiting after your treatment, we encourage you to take your nausea medication as prescribed.   If you develop nausea and vomiting that is not controlled by your nausea medication, call the clinic.   BELOW ARE SYMPTOMS THAT SHOULD BE REPORTED IMMEDIATELY:  *FEVER GREATER THAN 100.5 F  *CHILLS WITH OR WITHOUT FEVER  NAUSEA AND VOMITING THAT IS NOT CONTROLLED WITH YOUR NAUSEA MEDICATION  *UNUSUAL SHORTNESS OF BREATH  *UNUSUAL BRUISING OR BLEEDING  TENDERNESS IN MOUTH AND THROAT WITH OR WITHOUT PRESENCE OF ULCERS  *URINARY PROBLEMS  *BOWEL PROBLEMS  UNUSUAL RASH Items with * indicate a potential emergency and should be followed up as soon as possible.  Feel free to call the clinic should you have any questions or concerns. The clinic phone number is [phone].  Please show the [place] at check-in to the Emergency Department and triage nurse.  Cyanocobalamin, Pyridoxine, and Folate What is this medicine? A multivitamin containing folic acid, vitamin B6, and vitamin B12. This medicine may be used for other purposes; ask your health care provider or pharmacist if you have questions. COMMON BRAND NAME(S): AllanFol RX, AllanTex, Av-Vite FB, B Complex with Folic Acid, ComBgen, FaBB, Folamin, Folastin, [place], [place], [place], [place], [place], [employer], [place] RX 2.2, [place], [place] 2.2, Foltabs 800, Foltx, Homocysteine Formula, Niva-Fol, NuFol, TL [employer], Virt-Gard, Virt-Vite, Virt-Vite [place], Vita-Respa What should I tell my health care provider before I take this medicine? They need to know if you have any of these conditions:  bleeding or clotting disorder  history of anemia of any type  other chronic health condition  an unusual or allergic reaction to vitamins,  other medicines, foods, dyes, or preservatives  pregnant or trying to get pregnant  breast-feeding How should I use this medicine? Take by mouth with a glass of water. May take with food. Follow the directions on the prescription label. It is usually given once a day. Do not take your medicine more often than directed. Contact your pediatrician regarding the use of this medicine in children. Special care may be needed. Overdosage: If you think you have taken too much of this medicine contact a poison control center or emergency room at once. NOTE: This medicine is only for you. Do not share this medicine with others. What if I miss a dose? If you miss a dose, take it as soon as you can. If it is almost time for your next dose, take only that dose. Do not take double or extra doses. What may interact with this medicine?  levodopa This list may not describe all possible interactions. Give your health care provider a list of all the medicines, herbs, non-prescription drugs, or dietary supplements you use. Also tell them if you smoke, drink alcohol, or use illegal drugs. Some items may interact with your medicine. What should I watch for while using this medicine? See your health care professional for regular checks on your progress. Remember that vitamin supplements do not replace the need for good nutrition from a balanced diet. What side effects may I notice from receiving this medicine? Side effects that you should report to your doctor or health care professional as soon as possible:  allergic reaction such as skin rash or difficulty breathing  vomiting Side effects that usually do not require medical attention (report to your doctor or health  care professional if they continue or are bothersome):  nausea  stomach upset This list may not describe all possible side effects. Call your doctor for medical advice about side effects. You may report side effects to FDA at  1-800-FDA-1088. Where should I keep my medicine? Keep out of the reach of children. Most vitamins should be stored at controlled room temperature. Check your specific product directions. Protect from heat and moisture. Throw away any unused medicine after the expiration date. NOTE: This sheet is a summary. It may not cover all possible information. If you have questions about this medicine, talk to your doctor, pharmacist, or health care provider.  2020 Elsevier/Gold Standard (2007-06-16 00:59:55)  Coronavirus (COVID-19) Are you at risk?  Are you at risk for the Coronavirus (COVID-19)?  To be considered HIGH RISK for Coronavirus (COVID-19), you have to meet the following criteria:  . Traveled to [place], [place], [place], [place] or [place]; or in the [place] to [place], [place], [place], or [place]; and have fever, cough, and shortness of breath within the last 2 weeks of travel OR . Been in close contact with a person diagnosed with COVID-19 within the last 2 weeks and have fever, cough, and shortness of breath . IF YOU DO NOT MEET THESE CRITERIA, YOU ARE CONSIDERED LOW RISK FOR COVID-19.  What to do if you are HIGH RISK for COVID-19?  Marland Kitchen If you are having a medical emergency, call 911. . Seek medical care right away. Before you go to a doctor's office, urgent care or emergency department, call ahead and tell them about your recent travel, contact with someone diagnosed with COVID-19, and your symptoms. You should receive instructions from your physician's office regarding next steps of care.  . When you arrive at healthcare provider, tell the healthcare staff immediately you have returned from visiting [place], [place], [place], [place] or [place]; or traveled in the [place] to [place], [place], [place], or [place]; in the last two weeks or you have been in close contact with a person diagnosed with COVID-19 in the last 2 weeks.   . Tell the health care staff about  your symptoms: fever, cough and shortness of breath. . After you have been seen by a medical provider, you will be either: o Tested for (COVID-19) and discharged home on quarantine except to seek medical care if symptoms worsen, and asked to  - Stay home and avoid contact with others until you get your results (4-5 days)  - Avoid travel on public transportation if possible (such as bus, train, or airplane) or o Sent to the Emergency Department by EMS for evaluation, COVID-19 testing, and possible admission depending on your condition and test results.  What to do if you are LOW RISK for COVID-19?  Reduce your risk of any infection by using the same precautions used for avoiding the common cold or flu:  Marland Kitchen Wash your hands often with soap and warm water for at least 20 seconds.  If soap and water are not readily available, use an alcohol-based hand sanitizer with at least 60% alcohol.  . If coughing or sneezing, cover your mouth and nose by coughing or sneezing into the elbow areas of your shirt or coat, into a tissue or into your sleeve (not your hands). . Avoid shaking hands with others and consider head nods or verbal greetings only. . Avoid touching your eyes, nose, or mouth with unwashed hands.  . Avoid close contact with  people who are sick. . Avoid places or events with large numbers of people in one location, like concerts or sporting events. . Carefully consider travel plans you have or are making. . If you are planning any travel outside or inside the [place], visit the CDC's Travelers' Health webpage for the latest health notices. . If you have some symptoms but not all symptoms, continue to monitor at home and seek medical attention if your symptoms worsen. . If you are having a medical emergency, call 911.   [place] / e-Visit: [email or web address]         [facility]:  [facility]: [phone]                   [facility]: [phone]

## 2019-03-21 ENCOUNTER — Telehealth: Payer: Self-pay | Admitting: Hematology and Oncology

## 2019-03-21 DIAGNOSIS — C7951 Secondary malignant neoplasm of bone: Secondary | ICD-10-CM | POA: Diagnosis not present

## 2019-03-21 DIAGNOSIS — E559 Vitamin D deficiency, unspecified: Secondary | ICD-10-CM | POA: Diagnosis not present

## 2019-03-21 DIAGNOSIS — G839 Paralytic syndrome, unspecified: Secondary | ICD-10-CM | POA: Diagnosis not present

## 2019-03-21 DIAGNOSIS — G9520 Unspecified cord compression: Secondary | ICD-10-CM | POA: Diagnosis not present

## 2019-03-21 DIAGNOSIS — E785 Hyperlipidemia, unspecified: Secondary | ICD-10-CM | POA: Diagnosis not present

## 2019-03-21 DIAGNOSIS — K219 Gastro-esophageal reflux disease without esophagitis: Secondary | ICD-10-CM | POA: Diagnosis not present

## 2019-03-21 DIAGNOSIS — C493 Malignant neoplasm of connective and soft tissue of thorax: Secondary | ICD-10-CM | POA: Diagnosis not present

## 2019-03-21 DIAGNOSIS — C50411 Malignant neoplasm of upper-outer quadrant of right female breast: Secondary | ICD-10-CM | POA: Diagnosis not present

## 2019-03-21 DIAGNOSIS — Z0000 Encounter for general adult medical examination without abnormal findings: Secondary | ICD-10-CM | POA: Diagnosis not present

## 2019-03-21 DIAGNOSIS — J309 Allergic rhinitis, unspecified: Secondary | ICD-10-CM | POA: Diagnosis not present

## 2019-03-21 DIAGNOSIS — I10 Essential (primary) hypertension: Secondary | ICD-10-CM | POA: Diagnosis not present

## 2019-03-21 DIAGNOSIS — F419 Anxiety disorder, unspecified: Secondary | ICD-10-CM | POA: Diagnosis not present

## 2019-03-21 NOTE — Telephone Encounter (Signed)
VG PAL 12/4 moved f/u to [facility]. Left message for patient. No change in appointment time.

## 2019-04-12 ENCOUNTER — Inpatient Hospital Stay: Payer: Medicare Other

## 2019-04-12 ENCOUNTER — Inpatient Hospital Stay: Payer: Medicare Other | Attending: Hematology and Oncology | Admitting: Adult Health

## 2019-04-12 ENCOUNTER — Encounter: Payer: Self-pay | Admitting: Adult Health

## 2019-04-12 ENCOUNTER — Other Ambulatory Visit: Payer: Self-pay

## 2019-04-12 VITALS — BP 147/68 | HR 70 | Temp 98.3°F | Resp 18 | Ht 64.0 in | Wt 124.0 lb

## 2019-04-12 DIAGNOSIS — C7951 Secondary malignant neoplasm of bone: Secondary | ICD-10-CM | POA: Insufficient documentation

## 2019-04-12 DIAGNOSIS — C50911 Malignant neoplasm of unspecified site of right female breast: Secondary | ICD-10-CM

## 2019-04-12 DIAGNOSIS — Z79811 Long term (current) use of aromatase inhibitors: Secondary | ICD-10-CM | POA: Diagnosis not present

## 2019-04-12 DIAGNOSIS — Z5112 Encounter for antineoplastic immunotherapy: Secondary | ICD-10-CM | POA: Insufficient documentation

## 2019-04-12 DIAGNOSIS — G9520 Unspecified cord compression: Secondary | ICD-10-CM | POA: Diagnosis not present

## 2019-04-12 DIAGNOSIS — C50411 Malignant neoplasm of upper-outer quadrant of right female breast: Secondary | ICD-10-CM

## 2019-04-12 DIAGNOSIS — C7931 Secondary malignant neoplasm of brain: Secondary | ICD-10-CM | POA: Diagnosis not present

## 2019-04-12 DIAGNOSIS — Z923 Personal history of irradiation: Secondary | ICD-10-CM | POA: Diagnosis not present

## 2019-04-12 DIAGNOSIS — G9529 Other cord compression: Secondary | ICD-10-CM

## 2019-04-12 DIAGNOSIS — G8220 Paraplegia, unspecified: Secondary | ICD-10-CM | POA: Diagnosis not present

## 2019-04-12 DIAGNOSIS — D696 Thrombocytopenia, unspecified: Secondary | ICD-10-CM | POA: Diagnosis not present

## 2019-04-12 DIAGNOSIS — Z170 Estrogen receptor positive status [ER+]: Secondary | ICD-10-CM | POA: Insufficient documentation

## 2019-04-12 DIAGNOSIS — Z9221 Personal history of antineoplastic chemotherapy: Secondary | ICD-10-CM | POA: Diagnosis not present

## 2019-04-12 DIAGNOSIS — Z95828 Presence of other vascular implants and grafts: Secondary | ICD-10-CM

## 2019-04-12 LAB — CMP (CANCER CENTER ONLY)
ALT: 24 U/L (ref 0–44)
AST: 37 U/L (ref 15–41)
Albumin: 3.7 g/dL (ref 3.5–5.0)
Alkaline Phosphatase: 125 U/L (ref 38–126)
Anion gap: 10 (ref 5–15)
BUN: 13 mg/dL (ref 8–23)
CO2: 26 mmol/L (ref 22–32)
Calcium: 10.3 mg/dL (ref 8.9–10.3)
Chloride: 105 mmol/L (ref 98–111)
Creatinine: 0.76 mg/dL (ref 0.44–1.00)
GFR, Est AFR Am: >60 mL/min (ref >60)
GFR, Estimated: >60 mL/min (ref >60)
Glucose, Bld: 98 mg/dL (ref 70–99)
Potassium: 4 mmol/L (ref 3.5–5.1)
Sodium: 141 mmol/L (ref 135–145)
Total Bilirubin: 0.8 mg/dL (ref 0.3–1.2)
Total Protein: 7.1 g/dL (ref 6.5–8.1)

## 2019-04-12 LAB — CBC WITH DIFFERENTIAL (CANCER CENTER ONLY)
Abs Immature Granulocytes: 0 10*3/uL (ref 0.00–0.07)
Basophils Absolute: 0 10*3/uL (ref 0.0–0.1)
Basophils Relative: 1 %
Eosinophils Absolute: 0.1 10*3/uL (ref 0.0–0.5)
Eosinophils Relative: 3 %
HCT: 38.1 % (ref 36.0–46.0)
Hemoglobin: 12.4 g/dL (ref 12.0–15.0)
Immature Granulocytes: 0 %
Lymphocytes Relative: 13 %
Lymphs Abs: 0.4 10*3/uL — ABNORMAL LOW (ref 0.7–4.0)
MCH: 30.6 pg (ref 26.0–34.0)
MCHC: 32.5 g/dL (ref 30.0–36.0)
MCV: 94.1 fL (ref 80.0–100.0)
Monocytes Absolute: 0.3 10*3/uL (ref 0.1–1.0)
Monocytes Relative: 11 %
Neutro Abs: 2.3 10*3/uL (ref 1.7–7.7)
Neutrophils Relative %: 72 %
Platelet Count: 75 10*3/uL — ABNORMAL LOW (ref 150–400)
RBC: 4.05 MIL/uL (ref 3.87–5.11)
RDW: 15.1 % (ref 11.5–15.5)
WBC Count: 3.1 10*3/uL — ABNORMAL LOW (ref 4.0–10.5)
nRBC: 0 % (ref 0.0–0.2)

## 2019-04-12 MED ORDER — CYANOCOBALAMIN 1000 MCG/ML IJ SOLN
INTRAMUSCULAR | Status: AC
  Filled 2019-04-12: qty 1

## 2019-04-12 MED ORDER — SODIUM CHLORIDE 0.9% FLUSH
10.0000 mL | Freq: Once | INTRAVENOUS | Status: AC
  Administered 2019-04-12: 10 mL
  Filled 2019-04-12: qty 10

## 2019-04-12 MED ORDER — DEXTROSE 5 % IV SOLN
Freq: Once | INTRAVENOUS | Status: AC
  Administered 2019-04-12: 10:00:00 via INTRAVENOUS
  Filled 2019-04-12: qty 250

## 2019-04-12 MED ORDER — PALONOSETRON HCL INJECTION 0.25 MG/5ML
0.2500 mg | Freq: Once | INTRAVENOUS | Status: AC
  Administered 2019-04-12: 0.25 mg via INTRAVENOUS

## 2019-04-12 MED ORDER — PALONOSETRON HCL INJECTION 0.25 MG/5ML
INTRAVENOUS | Status: AC
  Filled 2019-04-12: qty 5

## 2019-04-12 MED ORDER — HEPARIN SOD (PORK) LOCK FLUSH 100 UNIT/ML IV SOLN
500.0000 [IU] | Freq: Once | INTRAVENOUS | Status: AC | PRN
  Administered 2019-04-12: 500 [IU]
  Filled 2019-04-12: qty 5

## 2019-04-12 MED ORDER — ACETAMINOPHEN 325 MG PO TABS
ORAL_TABLET | ORAL | Status: AC
  Filled 2019-04-12: qty 2

## 2019-04-12 MED ORDER — FAM-TRASTUZUMAB DERUXTECAN-NXKI CHEMO 100 MG IV SOLR
200.0000 mg | Freq: Once | INTRAVENOUS | Status: AC
  Administered 2019-04-12: 200 mg via INTRAVENOUS
  Filled 2019-04-12: qty 10

## 2019-04-12 MED ORDER — SODIUM CHLORIDE 0.9% FLUSH
10.0000 mL | INTRAVENOUS | Status: DC | PRN
  Administered 2019-04-12: 10 mL
  Filled 2019-04-12: qty 10

## 2019-04-12 MED ORDER — CYANOCOBALAMIN 1000 MCG/ML IJ SOLN
1000.0000 ug | Freq: Once | INTRAMUSCULAR | Status: AC
  Administered 2019-04-12: 1000 ug via INTRAMUSCULAR

## 2019-04-12 MED ORDER — ACETAMINOPHEN 325 MG PO TABS
650.0000 mg | ORAL_TABLET | Freq: Once | ORAL | Status: AC
  Administered 2019-04-12: 650 mg via ORAL

## 2019-04-12 NOTE — Patient Instructions (Signed)
[facility] Discharge Instructions for Patients Receiving Chemotherapy  Today you received the following chemotherapy agents: enhertu  To help prevent nausea and vomiting after your treatment, we encourage you to take your nausea medication as directed.   If you develop nausea and vomiting that is not controlled by your nausea medication, call the clinic.   BELOW ARE SYMPTOMS THAT SHOULD BE REPORTED IMMEDIATELY:  *FEVER GREATER THAN 100.5 F  *CHILLS WITH OR WITHOUT FEVER  NAUSEA AND VOMITING THAT IS NOT CONTROLLED WITH YOUR NAUSEA MEDICATION  *UNUSUAL SHORTNESS OF BREATH  *UNUSUAL BRUISING OR BLEEDING  TENDERNESS IN MOUTH AND THROAT WITH OR WITHOUT PRESENCE OF ULCERS  *URINARY PROBLEMS  *BOWEL PROBLEMS  UNUSUAL RASH Items with * indicate a potential emergency and should be followed up as soon as possible.  Feel free to call the clinic should you have any questions or concerns. The clinic phone number is [phone].  Please show the [place] at check-in to the Emergency Department and triage nurse.

## 2019-04-12 NOTE — Progress Notes (Signed)
Ok to treat with platelets of 75 per [facility]/NP.

## 2019-04-12 NOTE — Progress Notes (Signed)
[name]    Heather Stains, MD [place] [address]   DIAGNOSIS: Cancer Staging Primary cancer of upper outer quadrant of right female breast [facility]) Staging form: Breast, AJCC 7th Edition - Clinical: Stage IIB (T2, N1, cM0) - Unsigned Staging comments: Staged at breast conference 11.6.13  - Pathologic: Stage IV (M1) - Signed by [name], NP on 06/22/2018   SUMMARY OF ONCOLOGIC HISTORY: Oncology History  Primary cancer of upper outer quadrant of right female breast ([place])  03/08/2012 Initial Diagnosis   Right breast invasive ductal carcinoma ER positive PR negative HER-2 positive Ki-67 44%; another breast mass biopsied in the anterior part of the breast which was also positive for malignancy that was HER-2 negative   03/14/2012 Cancer Staging   Staging form: Breast, AJCC 7th Edition - Pathologic: Stage IV (M1) - Signed by [name], NP on 06/22/2018   05/10/2012 Surgery   Right mastectomy and axillary lymph node dissection: Multifocal disease 5 cm, 1.7 cm, 1.6 cm, ER positive PR negative HER-2 positive Ki-67 44%, 3/7 lymph nodes positive   06/14/2012 - 06/12/2013 Chemotherapy   Adjuvant chemotherapy with [place] 6 followed by Herceptin maintenance   10/25/2012 - 12/11/2012 Radiation Therapy   Adjuvant radiation therapy   02/07/2013 -  Anti-estrogen oral therapy   Letrozole 2.5 mg daily   02/14/2015 Imaging   MRI spine: Large destructive T5 lesion with severe pathologic compression fracture and extensive epidural tumor, severe spinal stenosis with moderate cord compression, tumor involvement of T4   03/18/2015 PET scan   Residual enhancing soft tissue adjacent to the spinal cord. No evidence of metastatic disease. Nonspecific uptake the left nipple   03/27/2015 - 03/30/2015 Hospital Admission   T4-6 decompression for spinal cord compression (lower extremity paralysis)   04/10/2015 -  Chemotherapy    Palliative treatment with Herceptin every 3 weeks with letrozole 2.5 mg daily   04/13/2015 - 05/19/2015 Radiation Therapy   Palliative radiation treatment to the spine   09/01/2015 Imaging   CT chest abdomen pelvis: Pathologic fracture with posterior fusion at T5 no other evidence of metastatic disease in the chest abdomen pelvis   12/23/2015 Imaging   CT CAP: new nonspecific 0.6 cm lymph node was stated mediastinum needs follow-up CT, innumerable tiny groundglass pulmonary nodules throughout both lungs unchanged, patchy consolidation from radiation, T5 fracture, no mets   04/15/2016 PET scan   Rt para-spinal mass mildy hypermetabolic SUV 5.9, lytic cortical lesion 4.8 cm lesion left prox femur suv 8.8 favor osseus met disease   04/28/2016 - 05/12/2016 Radiation Therapy   Palliative XRT Left femur   05/24/2016 Procedure   Soft tissue mass biopsy back: Metastatic breast cancer ER 80%, PR 0%, Ki-67 50%, HER-2 positive ratio 2.25   06/09/2016 - 03/29/2017 Anti-estrogen oral therapy   Faslodex with Herceptin and Perjeta every 4 weeks   06/13/2016 - 06/15/2016 Hospital Admission   uncomplicated revision of previous thoracic instrumentation.   10/10/2016 PET scan   Interval development of new hypermetabolic 2.5 x 1.1 cm lesion posterior right eighth rib consistent with metastatic disease, stable right paraspinal lesion at T8, clustered soft tissue nodules in the posterior midline back near cervicothoracic junction smaller than before    11/03/2016 - 11/07/2016 Radiation Therapy   Palliative radiation to right eighth rib   11/04/2016 Imaging   Solitary contrast-enhancing lesion in the right cerebellum 6 x 2 mm concerning for metastatic lesion   11/24/2016 -  11/24/2016 Radiation Therapy   SRS to the brain lesion   04/18/2017 PET scan   New ill-defined rounded airspace opacity in posterior right lower lobe. Differential diagnosis includes malignancy and infectious/inflammatory process.Increased  hypermetabolic lytic lesion in T8 vertebral body, consistent with bone metastasis.  Increased hypermetabolic posterior upper chest wall subcutaneous nodules, highly suspicious metastatic disease. Further improvement in right posterior eighth rib metastasis.   05/04/2017 - 07/27/2017 Chemotherapy   Kadcyla every 3 weeks palliative chemotherapy stopped for progression   07/26/2017 Imaging   Interval increase in size of the multiple enhancing masses posteriorly overlying the spine, interval development of left axillary and mediastinal adenopathy, increase in the size of lytic lesions in T8-T9 and left ninth rib, increased groundglass opacities right lower lobe lung   07/27/2017 - 05/11/2018 Chemotherapy   Palbociclib + Faslodex + trastuzumab + Pertuzumab     04/03/2018 PET scan   PET CT scan showed progression of disease not only in the bones but also in the subcutaneous nodules.   05/15/2018 - 05/25/2018 Radiation Therapy   Palliative radiation to the back   06/01/2018 -  Chemotherapy   Enhertu (Transtuzumab Deruxtecan)    08/07/2018 - 08/07/2018 Radiation Therapy   T12 vertebral body SRS   Carcinoma of right breast, stage 4 (HCC)  02/14/2015 Initial Diagnosis   Breast cancer metastasized to bone, right ([place])   06/01/2018 -  Chemotherapy   The patient had palonosetron (ALOXI) injection 0.25 mg, 0.25 mg, Intravenous,  Once, 15 of 20 cycles Administration: 0.25 mg (06/01/2018), 0.25 mg (06/22/2018), 0.25 mg (08/24/2018), 0.25 mg (07/13/2018), 0.25 mg (08/03/2018), 0.25 mg (09/14/2018), 0.25 mg (10/05/2018), 0.25 mg (11/16/2018), 0.25 mg (10/26/2018), 0.25 mg (12/07/2018), 0.25 mg (12/28/2018), 0.25 mg (01/18/2019), 0.25 mg (03/15/2019), 0.25 mg (02/15/2019) fam-trastuzumab deruxtecan-nxki (ENHERTU) 332 mg in dextrose 5 % 100 mL chemo infusion, 5.4 mg/kg = 332 mg, Intravenous,  Once, 15 of 20 cycles Dose modification: 4.4 mg/kg (original dose 5.4 mg/kg, Cycle 7, Reason: Provider Judgment), 3.2 mg/kg (original dose  5.4 mg/kg, Cycle 9, Reason: Dose not tolerated) Administration: 332 mg (06/01/2018), 332 mg (06/22/2018), 332 mg (07/13/2018), 332 mg (08/24/2018), 332 mg (08/03/2018), 332 mg (09/14/2018), 272 mg (10/05/2018), 200 mg (11/16/2018), 272 mg (10/26/2018), 200 mg (12/07/2018), 200 mg (12/28/2018), 200 mg (01/18/2019), 200 mg (03/15/2019), 200 mg (02/15/2019)  for chemotherapy treatment.      CURRENT THERAPY: Enhertu every 4 weeks  INTERVAL HISTORY: Heather [name] 73 y.o. female returns for evaluation prior to her treatment. Her most recent scans were compatible with a response to treatment.  She is due for repeat imaging in the early Spring.  She does have stable pain.  At her last appt she had some right chest wall pains.  These have not returned, however if they do, she was recommended to undergo CTA.  She has some mild weakness in her lower extremities, and this is chronic and unchanged.  She notes she ran into a sharp spot on her leg, yesterday and put some ice immediately.  There is a small area there with mild bruising.     Patient Active Problem List   Diagnosis Date Noted  . Port-A-Cath in place 07/13/2018  . Goals of care, counseling/discussion 08/10/2017  . Brain metastasis ([place]) 11/18/2016  . Genetic testing 07/11/2016  . ALLERGY TO CHLORHEXADINE, USE BETADINE ONLY 06/23/2016  . Kyphosis of thoracic region 06/13/2016  . Plantar fasciitis, bilateral 04/20/2016  . Neuropathic pain 01/27/2016  . Anemia of chronic disease 05/21/2015  . Metastatic  cancer to spine ([place]) 03/27/2015  . Thrombocytopenia ([place]) 03/12/2015  . Prerenal azotemia 03/12/2015  . Pain   . Recurrent UTI--due to Klebsiella   . Paraplegia at T4 level ([place]) 02/20/2015  . Neurogenic bowel 02/20/2015  . Neurogenic bladder 02/20/2015  . Postoperative anemia due to acute blood loss 02/17/2015  . Spinal cord compression due to malignant neoplasm metastatic to spine ([place]) 02/15/2015  . Epidural mass 02/15/2015  . Thoracic spine tumor    . Pathologic fracture of vertebra 02/14/2015  . Pathologic fracture of thoracic vertebrae 02/14/2015  . Carcinoma of right breast, stage 4 ([place]) 02/14/2015  . Hyperlipidemia 02/14/2015  . H. pylori infection   . HTN (hypertension) 04/17/2012  . Primary cancer of upper outer quadrant of right female breast ([place]) 03/08/2012    is allergic to other; acyclovir and related; chlorhexidine; zanaflex [tizanidine hcl]; codeine; keflex [cephalexin]; naprosyn [naproxen]; oxycodone; tramadol; and tussin [guaifenesin].  MEDICAL HISTORY: Past Medical History:  Diagnosis Date  . Anemia    hx of  . Anxiety    r/t updated surgery  . Breast cancer ([place])    right  . Cataract    immature-not sure of which eye  . Complication of anesthesia    pt states she is sensitive to meds  . Dizziness    has been going on for 84month;medical MD aware  . Genetic testing 07/11/2016   Test Results: Normal; No pathogenic mutations detected  Genes Analyzed: 43 genes on [employer]'s Common Cancers panel (APC, ATM, AXIN2, BARD1, BMPR1A, BRCA1, BRCA2, BRIP1, CDH1, CDKN2A, CHEK2, DICER1, EPCAM, GREM1, HOXB13, KIT, MEN1, MLH1, MSH2, MSH6, MUTYH, NBN, NF1, PALB2, PDGFRA, PMS2, POLD1, POLE, PTEN, RAD50, RAD51C, RAD51D, SDHA, SDHB, SDHC, SDHD, SMAD4, SMARCA4, STK11, TP53, TSC1, TSC2, VHL).  .Marland KitchenGERD (gastroesophageal reflux disease)    Tums prn  . H. pylori infection   . H/O hiatal hernia   . Hemorrhoids   . History of UTI   . Hx of radiation therapy 10/25/12- 12/11/12   right chest wall/regional lymph nodes 5040 cGy, 28 sessions, right chest wall boost 1000 cGy 1 session  . Hx: UTI (urinary tract infection)   . Hyperlipidemia    but not on meds;diet and exercise controlled  . Hypertension    recently started Aldactone   . Insomnia    takes Melatoniin daily  . Metastasis to spinal column (HCC)    T5, Left  Femur cancer- radiation   . Neuropathy    "from back surgery"  . PONV (postoperative nausea and vomiting)    pt  experienced hair loss, confusion and combative- 02/15/15  . Right knee pain   . Right shoulder pain   . Skin cancer    squamous, Nodule to back - "same cancer as breast."  . Urinary frequency    d/t taking Aldactone    SURGICAL HISTORY: Past Surgical History:  Procedure Laterality Date  . COLONOSCOPY    . cyst removed from left breast  1970  . DECOMPRESSIVE LUMBAR LAMINECTOMY LEVEL 4 N/A 02/15/2015   Procedure: DECOMPRESSION T5 AND T3-T7 STABALIZATION;  Surgeon: NConsuella Lose MD;  [facility];  Service: Neurosurgery;  Laterality: N/A;  . ESOPHAGOGASTRODUODENOSCOPY    . LAMINECTOMY N/A 03/27/2015   Procedure: Thoracic Four-Thoracic Six Laminectomy for tumor;  Surgeon: NConsuella Lose MD;  [facility];  Service: Neurosurgery;  Laterality: N/A;  T4-T6 Laminectomy  . left wrist surgery   2004   with plate  . MASTECTOMY MODIFIED RADICAL  05/10/2012  Procedure: MASTECTOMY MODIFIED RADICAL;  Surgeon: Merrie Roof, MD;  Location: [facility];  Service: General;  Laterality: Right;  RIGHT MODIFIED RADICAL MASTECTOMY  . MASTECTOMY, RADICAL Right   . MINOR BREAST BIOPSY Left 05/16/2016   Procedure: EXCISION OF BACK NODULE;  Surgeon: Autumn Messing III, MD;  Location: [facility];  Service: General;  Laterality: Left;  . Nodule  back  05/17/2015  . PORTACATH PLACEMENT  05/10/2012   Procedure: INSERTION PORT-A-CATH;  Surgeon: Merrie Roof, MD;  Location: [facility];  Service: General;  Laterality: Left;    SOCIAL HISTORY: Social History   Socioeconomic History  . Marital status: Married    Spouse name: Not on file  . Number of children: 2  . Years of education: Not on file  . Highest education level: Not on file  Occupational History  . Not on file  Social Needs  . Financial resource strain: Not on file  . Food insecurity    Worry: Not on file    Inability: Not on file  . Transportation needs    Medical: Not on file    Non-medical: Not on file  Tobacco  Use  . Smoking status: Never Smoker  . Smokeless tobacco: Never Used  Substance and Sexual Activity  . Alcohol use: No  . Drug use: No  . Sexual activity: Yes    Birth control/protection: Post-menopausal  Lifestyle  . Physical activity    Days per week: Not on file    Minutes per session: Not on file  . Stress: Not on file  Relationships  . Social Herbalist on phone: Not on file    Gets together: Not on file    Attends religious service: Not on file    Active member of club or organization: Not on file    Attends meetings of clubs or organizations: Not on file    Relationship status: Not on file  . Intimate partner violence    Fear of current or ex partner: Not on file    Emotionally abused: Not on file    Physically abused: Not on file    Forced sexual activity: Not on file  Other Topics Concern  . Not on file  Social History Narrative  . Not on file    FAMILY HISTORY: Family History  Problem Relation Age of Onset  . Leukemia Mother 72       deceased 75  . Stroke Father   . Diabetes Mellitus II Father   . Prostate cancer Brother 7       currently 52  . Stomach cancer Maternal Grandmother 79       deceased 25  . Prostate cancer Brother 70       currently 75    Review of Systems  Constitutional: Negative for appetite change, chills, fatigue, fever and unexpected weight change.  HENT:   Negative for hearing loss, lump/mass, mouth sores and trouble swallowing.   Eyes: Negative for eye problems and icterus.  Respiratory: Negative for chest tightness, cough and shortness of breath.   Cardiovascular: Negative for chest pain, leg swelling and palpitations.  Gastrointestinal: Negative for abdominal distention, abdominal pain, constipation, diarrhea, nausea and vomiting.  Endocrine: Negative for hot flashes.  Genitourinary: Negative for difficulty urinating.   Musculoskeletal: Negative for arthralgias.  Skin: Negative for itching and rash.  Neurological:  Negative for dizziness, extremity weakness, headaches and numbness.  Hematological: Negative for adenopathy. Does not bruise/bleed easily.  Psychiatric/Behavioral: Negative for depression. The patient is not nervous/anxious.       PHYSICAL EXAMINATION  ECOG PERFORMANCE STATUS: 2 - Symptomatic, <50% confined to bed  Vitals:   04/12/19 0922  BP: (!) 147/68  Pulse: 70  Resp: 18  Temp: 98.3 F (36.8 C)  SpO2: 100%    Physical Exam Constitutional:      General: She is not in acute distress.    Appearance: Normal appearance. She is not toxic-appearing.  HENT:     Head: Normocephalic and atraumatic.  Eyes:     General: No scleral icterus.    Pupils: Pupils are equal, round, and reactive to light.  Neck:     Musculoskeletal: Neck supple.  Cardiovascular:     Rate and Rhythm: Normal rate and regular rhythm.     Pulses: Normal pulses.     Heart sounds: Normal heart sounds.  Pulmonary:     Effort: Pulmonary effort is normal.     Breath sounds: Normal breath sounds.  Abdominal:     General: Abdomen is flat. Bowel sounds are normal. There is no distension.     Palpations: Abdomen is soft.     Tenderness: There is no abdominal tenderness.  Musculoskeletal:        General: No swelling.  Lymphadenopathy:     Cervical: No cervical adenopathy.  Skin:    General: Skin is warm and dry.     Capillary Refill: Capillary refill takes less than 2 seconds.     Findings: No rash.  Neurological:     General: No focal deficit present.     Mental Status: She is alert.  Psychiatric:        Mood and Affect: Mood normal.        Behavior: Behavior normal.     LABORATORY DATA:  CBC    Component Value Date/Time   WBC 3.1 (L) 04/12/2019 0853   WBC 6.2 07/27/2017 0836   RBC 4.05 04/12/2019 0853   HGB 12.4 04/12/2019 0853   HGB 12.0 05/05/2017 1046   HCT 38.1 04/12/2019 0853   HCT 36.9 05/05/2017 1046   PLT 75 (L) 04/12/2019 0853   PLT 210 05/05/2017 1046   MCV 94.1 04/12/2019 0853    MCV 83.1 05/05/2017 1046   MCH 30.6 04/12/2019 0853   MCHC 32.5 04/12/2019 0853   RDW 15.1 04/12/2019 0853   RDW 14.8 (H) 05/05/2017 1046   LYMPHSABS 0.4 (L) 04/12/2019 0853   LYMPHSABS 0.6 (L) 05/05/2017 1046   MONOABS 0.3 04/12/2019 0853   MONOABS 0.3 05/05/2017 1046   EOSABS 0.1 04/12/2019 0853   EOSABS 0.1 05/05/2017 1046   BASOSABS 0.0 04/12/2019 0853   BASOSABS 0.1 05/05/2017 1046    CMP     Component Value Date/Time   NA 141 04/12/2019 0853   NA 137 05/05/2017 1046   K 4.0 04/12/2019 0853   K 3.9 05/05/2017 1046   CL 105 04/12/2019 0853   CL 101 10/25/2012 0946   CO2 26 04/12/2019 0853   CO2 26 05/05/2017 1046   GLUCOSE 98 04/12/2019 0853   GLUCOSE 88 05/05/2017 1046   GLUCOSE 90 10/25/2012 0946   BUN 13 04/12/2019 0853   BUN 14.1 05/05/2017 1046   CREATININE 0.76 04/12/2019 0853   CREATININE 0.8 05/05/2017 1046   CALCIUM 10.3 04/12/2019 0853   CALCIUM 10.3 05/05/2017 1046   PROT 7.1 04/12/2019 0853   PROT 7.2 05/05/2017 1046   ALBUMIN 3.7 04/12/2019 0853   ALBUMIN 3.7 05/05/2017 1046  AST 37 04/12/2019 0853   AST 26 05/05/2017 1046   ALT 24 04/12/2019 0853   ALT 17 05/05/2017 1046   ALKPHOS 125 04/12/2019 0853   ALKPHOS 74 05/05/2017 1046   BILITOT 0.8 04/12/2019 0853   BILITOT 0.43 05/05/2017 1046   GFRNONAA >60 04/12/2019 0853   GFRAA >60 04/12/2019 0853         ASSESSMENT and PLAN:   Primary cancer of upper outer quadrant of right female breast ([place]) Right breast invasive ductal carcinoma ER positive PR negative HER-2 positive Ki-67 44% multifocal disease 3/7 lymph nodes positive T2 N1 M0 stage IIB status post adjuvant chemotherapy with TCH followed by Herceptin maintenance, adjuvant radiation therapy and Letrozole. MRI 02/14/2015: T5 large destructive lesion with pathologic compression fracture and extensive epidural tumor, involvement of T4, excision metastatic carcinoma ER 60%, PR 0%, HER-2 positive ratio 2.71 average copy  #7.45 03/27/2015: T4-6 decompression surgery for spinal cord compression PET-CT 12/10/17Rt para-spinal mass mildy hypermetabolic SUV 5.9, lytic cortical lesion 4.8 cm lesion left prox femur suv 8.8 favor osseus met disease Echo 04/12/2017 shows well preserved ejection fraction of 55-60%  Treatment summary: 1. Resumed anastrozole 05/21/2015, but it has caused significant hair loss so we switched her to exemestane 25 mg once daily 07/23/2015 2. Herceptin every 3 week started 04/10/2015 3. Bone metastases: Zometa every 3 months 4. Radiation therapy to the spine (Dr. [name]) 04/13/2015 to 05/19/2015 5. Radiation therapy to left femur 04/28/2016 to 05/12/2016  6. SRS to the brain lesion 11/24/2016 7.Kadcyla started 05/05/2017 discontinued 07/27/2017 8.Palbociclib +Faslodex +trastuzumab+ Pertuzumabstarted 08/10/2017, stopped 04/04/2018 9. Enhertu started 06/01/18 -------------------------------------------------------------------------------------------------------------------------- XRT to T123/04/2019 PET CT scan 10/03/2018:stable, improvement in subcutaneous mets MRI brain9/02/2019: no intraparenchymal metastases, calvarial metastases stable  Current treatment:Cycle15day 1Enhertu (Fam-trastuzumab-Durextecan) Echo 11/2018 shows EF of 55-60%.  Completed every 6 months.    Toxicities: 1. Severe fatigue-improved with dose reduction 2. Pain: controlled right now with Gabapentin. 3.Hair loss 4. Thrombocytopenia: plt count remains stable.  Will continue as ordered.    Currently on every 4-weekEnhertu We also plan to do scans every 6 months which will be done in February 2021. She will return in 4 weeks for labs, f/u with [name], and her next treatment.     All questions were answered. The patient knows to call the clinic with any problems, questions or concerns. We can certainly see the patient much sooner if necessary.  A total of (20) minutes of face-to-face time was  spent with this patient with greater than 50% of that time in counseling and care-coordination.  This note was electronically signed. Scot Dock, NP 04/12/2019

## 2019-04-12 NOTE — Assessment & Plan Note (Addendum)
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 MRI 02/14/2015: T5 large destructive lesion with pathologic compression fracture and extensive epidural tumor, involvement of T4, excision metastatic carcinoma ER 60%, PR 0%, HER-2 positive ratio 2.71 average copy #7.45 03/27/2015: T4-6 decompression surgery for spinal cord compression PET-CT 12/10/17Rt para-spinal mass mildy hypermetabolic SUV 5.9, lytic cortical lesion 4.8 cm lesion left prox femur suv 8.8 favor osseus met disease Echo 04/12/2017 shows well preserved ejection fraction of 55-60%  Treatment summary: 1. Resumed anastrozole 05/21/2015, but it has caused significant hair loss so we switched her to exemestane 25 mg once daily 07/23/2015 2. Herceptin every 3 week started 04/10/2015 3. Bone metastases: Zometa every 3 months 4. Radiation therapy to the spine (Dr. [name]) 04/13/2015 to 05/19/2015 5. Radiation therapy to left femur 04/28/2016 to 05/12/2016  6. SRS to the brain lesion 11/24/2016 7.Kadcyla started 05/05/2017 discontinued 07/27/2017 8.Palbociclib +Faslodex +trastuzumab+ Pertuzumabstarted 08/10/2017, stopped 04/04/2018 9. Enhertu started 06/01/18 -------------------------------------------------------------------------------------------------------------------------- XRT to T123/04/2019 PET CT scan 10/03/2018:stable, improvement in subcutaneous mets MRI brain9/02/2019: no intraparenchymal metastases, calvarial metastases stable  Current treatment:Cycle15day 1Enhertu (Fam-trastuzumab-Durextecan) Echo 11/2018 shows EF of 55-60%.  Completed every 6 months.    Toxicities: 1. Severe fatigue-improved with dose reduction 2. Pain: controlled right now with Gabapentin. 3.Hair loss 4.  Thrombocytopenia: plt count remains stable.  Will continue as ordered.    Currently on every 4-weekEnhertu We also plan to do scans every 6 months which will be done in February 2021. She will return in 4 weeks for labs, f/u with [name], and her next treatment.

## 2019-04-24 ENCOUNTER — Other Ambulatory Visit: Payer: Self-pay | Admitting: Radiation Therapy

## 2019-04-24 DIAGNOSIS — C7949 Secondary malignant neoplasm of other parts of nervous system: Secondary | ICD-10-CM

## 2019-04-24 DIAGNOSIS — C7931 Secondary malignant neoplasm of brain: Secondary | ICD-10-CM

## 2019-04-25 ENCOUNTER — Other Ambulatory Visit: Payer: Self-pay | Admitting: Radiation Therapy

## 2019-05-08 ENCOUNTER — Other Ambulatory Visit: Payer: Self-pay

## 2019-05-08 DIAGNOSIS — C7951 Secondary malignant neoplasm of bone: Secondary | ICD-10-CM

## 2019-05-08 NOTE — Progress Notes (Signed)
Patient Care Team: [name], MD as PCP - General (Family Medicine)  DIAGNOSIS:    ICD-10-CM   1. Primary cancer of upper outer quadrant of right female breast ([place])  C50.411     SUMMARY OF ONCOLOGIC HISTORY: Oncology History  Primary cancer of upper outer quadrant of right female breast ([place])  03/08/2012 Initial Diagnosis   Right breast invasive ductal carcinoma ER positive PR negative HER-2 positive Ki-67 44%; another breast mass biopsied in the anterior part of the breast which was also positive for malignancy that was HER-2 negative   03/14/2012 Cancer Staging   Staging form: Breast, AJCC 7th Edition - Pathologic: Stage IV (M1) - Signed by [name], NP on 06/22/2018   05/10/2012 Surgery   Right mastectomy and axillary lymph node dissection: Multifocal disease 5 cm, 1.7 cm, 1.6 cm, ER positive PR negative HER-2 positive Ki-67 44%, 3/7 lymph nodes positive   06/14/2012 - 06/12/2013 Chemotherapy   Adjuvant chemotherapy with Pierce 6 followed by Herceptin maintenance   10/25/2012 - 12/11/2012 Radiation Therapy   Adjuvant radiation therapy   02/07/2013 -  Anti-estrogen oral therapy   Letrozole 2.5 mg daily   02/14/2015 Imaging   MRI spine: Large destructive T5 lesion with severe pathologic compression fracture and extensive epidural tumor, severe spinal stenosis with moderate cord compression, tumor involvement of T4   03/18/2015 PET scan   Residual enhancing soft tissue adjacent to the spinal cord. No evidence of metastatic disease. Nonspecific uptake the left nipple   03/27/2015 - 03/30/2015 Hospital Admission   T4-6 decompression for spinal cord compression (lower extremity paralysis)   04/10/2015 -  Chemotherapy   Palliative treatment with Herceptin every 3 weeks with letrozole 2.5 mg daily   04/13/2015 - 05/19/2015 Radiation Therapy   Palliative radiation treatment to the spine   09/01/2015 Imaging   CT chest abdomen pelvis: Pathologic fracture with posterior  fusion at T5 no other evidence of metastatic disease in the chest abdomen pelvis   12/23/2015 Imaging   CT CAP: new nonspecific 0.6 cm lymph node was stated mediastinum needs follow-up CT, innumerable tiny groundglass pulmonary nodules throughout both lungs unchanged, patchy consolidation from radiation, T5 fracture, no mets   04/15/2016 PET scan   Rt para-spinal mass mildy hypermetabolic SUV 5.9, lytic cortical lesion 4.8 cm lesion left prox femur suv 8.8 favor osseus met disease   04/28/2016 - 05/12/2016 Radiation Therapy   Palliative XRT Left femur   05/24/2016 Procedure   Soft tissue mass biopsy back: Metastatic breast cancer ER 80%, PR 0%, Ki-67 50%, HER-2 positive ratio 2.25   06/09/2016 - 03/29/2017 Anti-estrogen oral therapy   Faslodex with Herceptin and Perjeta every 4 weeks   06/13/2016 - 06/15/2016 Hospital Admission   uncomplicated revision of previous thoracic instrumentation.   10/10/2016 PET scan   Interval development of new hypermetabolic 2.5 x 1.1 cm lesion posterior right eighth rib consistent with metastatic disease, stable right paraspinal lesion at T8, clustered soft tissue nodules in the posterior midline back near cervicothoracic junction smaller than before    11/03/2016 - 11/07/2016 Radiation Therapy   Palliative radiation to right eighth rib   11/04/2016 Imaging   Solitary contrast-enhancing lesion in the right cerebellum 6 x 2 mm concerning for metastatic lesion   11/24/2016 - 11/24/2016 Radiation Therapy   SRS to the brain lesion   04/18/2017 PET scan   New ill-defined rounded airspace opacity in posterior right lower lobe. Differential diagnosis includes malignancy and infectious/inflammatory process.Increased hypermetabolic lytic lesion  in T8 vertebral body, consistent with bone metastasis.  Increased hypermetabolic posterior upper chest wall subcutaneous nodules, highly suspicious metastatic disease. Further improvement in right posterior eighth rib metastasis.     05/04/2017 - 07/27/2017 Chemotherapy   Kadcyla every 3 weeks palliative chemotherapy stopped for progression   07/26/2017 Imaging   Interval increase in size of the multiple enhancing masses posteriorly overlying the spine, interval development of left axillary and mediastinal adenopathy, increase in the size of lytic lesions in T8-T9 and left ninth rib, increased groundglass opacities right lower lobe lung   07/27/2017 - 05/11/2018 Chemotherapy   Palbociclib + Faslodex + trastuzumab + Pertuzumab     04/03/2018 PET scan   PET CT scan showed progression of disease not only in the bones but also in the subcutaneous nodules.   05/15/2018 - 05/25/2018 Radiation Therapy   Palliative radiation to the back   06/01/2018 -  Chemotherapy   Enhertu (Transtuzumab Deruxtecan)    08/07/2018 - 08/07/2018 Radiation Therapy   T12 vertebral body SRS   Carcinoma of right breast, stage 4 ([facility])  02/14/2015 Initial Diagnosis   Breast cancer metastasized to bone, right ([place])   06/01/2018 -  Chemotherapy   The patient had palonosetron (ALOXI) injection 0.25 mg, 0.25 mg, Intravenous,  Once, 15 of 20 cycles Administration: 0.25 mg (06/01/2018), 0.25 mg (06/22/2018), 0.25 mg (08/24/2018), 0.25 mg (07/13/2018), 0.25 mg (08/03/2018), 0.25 mg (09/14/2018), 0.25 mg (10/05/2018), 0.25 mg (11/16/2018), 0.25 mg (10/26/2018), 0.25 mg (12/07/2018), 0.25 mg (12/28/2018), 0.25 mg (01/18/2019), 0.25 mg (03/15/2019), 0.25 mg (02/15/2019), 0.25 mg (04/12/2019) fam-trastuzumab deruxtecan-nxki (ENHERTU) 332 mg in dextrose 5 % 100 mL chemo infusion, 5.4 mg/kg = 332 mg, Intravenous,  Once, 15 of 20 cycles Dose modification: 4.4 mg/kg (original dose 5.4 mg/kg, Cycle 7, Reason: Provider Judgment), 3.2 mg/kg (original dose 5.4 mg/kg, Cycle 9, Reason: Dose not tolerated) Administration: 332 mg (06/01/2018), 332 mg (06/22/2018), 332 mg (07/13/2018), 332 mg (08/24/2018), 332 mg (08/03/2018), 332 mg (09/14/2018), 272 mg (10/05/2018), 200 mg (11/16/2018), 272 mg  (10/26/2018), 200 mg (12/07/2018), 200 mg (12/28/2018), 200 mg (01/18/2019), 200 mg (03/15/2019), 200 mg (02/15/2019), 200 mg (04/12/2019)  for chemotherapy treatment.      CHIEF COMPLIANT: Follow-up of metastatic breast cancer onEnhertu  INTERVAL HISTORY: Heather [name] is a 73 y.o. with above-mentioned history of metastatic breast cancercurrently onchemotherapy with trastuzumab-Deruxtecan (Enhertu)every 4 weeks.She presents to the clinic todayfor treatment.  She has been climbing up stairs and Christmas trees and removing decorations and she feels sore in the back.  She has an MRI of the brain coming up.  REVIEW OF SYSTEMS:   Constitutional: Denies fevers, chills or abnormal weight loss Eyes: Denies blurriness of vision Ears, nose, mouth, throat, and face: Denies mucositis or sore throat Respiratory: Denies cough, dyspnea or wheezes Cardiovascular: Denies palpitation, chest discomfort Gastrointestinal: Denies nausea, heartburn or change in bowel habits Skin: Denies abnormal skin rashes Lymphatics: Denies new lymphadenopathy or easy bruising Neurological: Denies numbness, tingling or new weaknesses Behavioral/Psych: Mood is stable, no new changes  Extremities: No lower extremity edema Breast: denies any pain or lumps or nodules in either breasts All other systems were reviewed with the patient and are negative.  I have reviewed the past medical history, past surgical history, social history and family history with the patient and they are unchanged from previous note.  ALLERGIES:  is allergic to other; acyclovir and related; chlorhexidine; zanaflex [tizanidine hcl]; codeine; keflex [cephalexin]; naprosyn [naproxen]; oxycodone; tramadol; and tussin [guaifenesin].  MEDICATIONS:  Current  Outpatient Medications  Medication Sig Dispense Refill  . ALPRAZolam (XANAX) 0.25 MG tablet Take 1 tablet by mouth as needed.    . Calcium Carbonate Antacid (TUMS PO) Take 2 tablets by mouth 2 (two)  times daily as needed (acid reflux).    . Cholecalciferol (VITAMIN D) 2000 UNITS tablet Take 2,000 Units by mouth daily.    . cycloSPORINE (RESTASIS) 0.05 % ophthalmic emulsion 1 drop 2 (two) times daily.    . fluticasone (FLONASE) 50 MCG/ACT nasal spray     . gabapentin (NEURONTIN) 600 MG tablet Take 1 tablet (600 mg total) by mouth as directed. Take 1 tablet in the AM and afternoon and 1 1/2 tabs at bedtime (Patient taking differently: Take 600 mg by mouth 3 (three) times daily. Take 1 tablet in the AM and afternoon and 1 1/2 tabs at bedtime) 315 tablet 2  . HYDROcodone-acetaminophen (NORCO/VICODIN) 5-325 MG tablet Take 1 tablet by mouth every 6 (six) hours as needed for moderate pain. 30 tablet 0  . lidocaine-prilocaine (EMLA) cream Apply to affected area once (Patient taking differently: Apply 1 application topically every 30 (thirty) days. Apply to port prior to infusions) 30 g 3  . omeprazole (PRILOSEC) 40 MG capsule TAKE 1 CAPSULE BY MOUTH ONCE DAILY FOR 30 DAYS  5  . ondansetron (ZOFRAN) 8 MG tablet Take 1 tablet (8 mg total) by mouth every 8 (eight) hours as needed for nausea or vomiting. 20 tablet 0  . OVER THE COUNTER MEDICATION Place 1 drop into both eyes 2 (two) times daily as needed (dry eyes). Over the counter lubricating eye drops    . polyethylene glycol (MIRALAX / GLYCOLAX) packet Take 17 g by mouth daily as needed.    . Probiotic Product (PROBIOTIC DAILY PO) Take 1 capsule by mouth daily.     . prochlorperazine (COMPAZINE) 10 MG tablet Take 1 tablet (10 mg total) by mouth every 6 (six) hours as needed for nausea or vomiting. 60 tablet 3  . spironolactone (ALDACTONE) 25 MG tablet Take 25 mg by mouth daily.     No current facility-administered medications for this visit.    PHYSICAL EXAMINATION: ECOG PERFORMANCE STATUS: 1 - Symptomatic but completely ambulatory  Vitals:   05/09/19 1139  BP: 138/70  Pulse: 72  Resp: 18  Temp: 98.2 F (36.8 C)  SpO2: 100%   Filed Weights     05/09/19 1139  Weight: 125 lb 8 oz (56.9 kg)    GENERAL: alert, no distress and comfortable SKIN: skin color, texture, turgor are normal, no rashes or significant lesions EYES: normal, Conjunctiva are pink and non-injected, sclera clear OROPHARYNX: no exudate, no erythema and lips, buccal mucosa, and tongue normal  NECK: supple, thyroid normal size, non-tender, without nodularity LYMPH: no palpable lymphadenopathy in the cervical, axillary or inguinal LUNGS: clear to auscultation and percussion with normal breathing effort HEART: regular rate & rhythm and no murmurs and no lower extremity edema ABDOMEN: abdomen soft, non-tender and normal bowel sounds MUSCULOSKELETAL: no cyanosis of digits and no clubbing  NEURO: alert & oriented x 3 with fluent speech, no focal motor/sensory deficits EXTREMITIES: No lower extremity edema  LABORATORY DATA:  I have reviewed the data as listed CMP Latest Ref Rng & Units 05/09/2019 04/12/2019 03/15/2019  Glucose 70 - 99 mg/dL 102(H) 98 157(H)  BUN 8 - 23 mg/dL '13 13 13  '$ Creatinine 0.44 - 1.00 mg/dL 0.73 0.76 0.77  Sodium 135 - 145 mmol/L 139 141 139  Potassium 3.5 - 5.1  mmol/L 3.7 4.0 3.7  Chloride 98 - 111 mmol/L 104 105 102  CO2 22 - 32 mmol/L '26 26 26  '$ Calcium 8.9 - 10.3 mg/dL 9.4 10.3 9.9  Total Protein 6.5 - 8.1 g/dL 7.0 7.1 7.0  Total Bilirubin 0.3 - 1.2 mg/dL 0.6 0.8 0.7  Alkaline Phos 38 - 126 U/L 130(H) 125 130(H)  AST 15 - 41 U/L 38 37 33  ALT 0 - 44 U/L '27 24 22    '$ Lab Results  Component Value Date   WBC 3.0 (L) 05/09/2019   HGB 12.5 05/09/2019   HCT 38.0 05/09/2019   MCV 93.4 05/09/2019   PLT 74 (L) 05/09/2019   NEUTROABS 2.3 05/09/2019    ASSESSMENT & PLAN:  Primary cancer of upper outer quadrant of right female breast ([place]) Right breast invasive ductal carcinoma ER positive PR negative HER-2 positive Ki-67 44% multifocal disease 3/7 lymph nodes positive T2 N1 M0 stage IIB status post adjuvant chemotherapy with TCH followed  by Herceptin maintenance, adjuvant radiation therapy and Letrozole. MRI 02/14/2015: T5 large destructive lesion with pathologic compression fracture and extensive epidural tumor, involvement of T4, excision metastatic carcinoma ER 60%, PR 0%, HER-2 positive ratio 2.71 average copy #7.45 03/27/2015: T4-6 decompression surgery for spinal cord compression PET-CT 12/10/17Rt para-spinal mass mildy hypermetabolic SUV 5.9, lytic cortical lesion 4.8 cm lesion left prox femur suv 8.8 favor osseus met disease Echo 04/12/2017 shows well preserved ejection fraction of 55-60%  Treatment summary: 1. Resumed anastrozole 05/21/2015, but it has caused significant hair loss so we switched her to exemestane 25 mg once daily 07/23/2015 2. Herceptin every 3 week started 04/10/2015 3. Bone metastases: Zometa every 3 months 4. Radiation therapy to the spine (Dr. [name]) 04/13/2015 to 05/19/2015 5. Radiation therapy to left femur 04/28/2016 to 05/12/2016  6. SRS to the brain lesion 11/24/2016 7.Kadcyla started 05/05/2017 discontinued 07/27/2017 8.Palbociclib +Faslodex +trastuzumab+ Pertuzumabstarted 08/10/2017, stopped 04/04/2018 9. Enhertu started 06/01/18 -------------------------------------------------------------------------------------------------------------------------- XRT to T123/04/2019 PET CT scan 10/03/2018:stable, improvement in subcutaneous mets MRI brain9/02/2019: no intraparenchymal metastases, calvarial metastases stable  Current treatment:Cycle16day 1Enhertu (Fam-trastuzumab-Durextecan) Echo 11/2018 shows EF of 55-60%.  Completed every 6 months.    Toxicities: 1. Severe fatigue-improved with dose reduction 2. Pain: controlled right now with Gabapentin. 3.Hair loss 4. Thrombocytopenia: plt count remains stable.  Will continue as ordered.    Currently onevery 4-weekEnhertu We also plan to do scans every 6 monthswhich will be done in February 2021. She will return in 4  weeks for labs, f/u     No orders of the defined types were placed in this encounter.  The patient has a good understanding of the overall plan. she agrees with it. she will call with any problems that may develop before the next visit here.  [name], MD 05/09/2019  Julious Oka Dorshimer, am acting as scribe for Dr. [name].  I have reviewed the above document for accuracy and completeness, and I agree with the above.

## 2019-05-09 ENCOUNTER — Inpatient Hospital Stay: Payer: Medicare Other

## 2019-05-09 ENCOUNTER — Other Ambulatory Visit: Payer: Self-pay | Admitting: Hematology and Oncology

## 2019-05-09 ENCOUNTER — Inpatient Hospital Stay (HOSPITAL_BASED_OUTPATIENT_CLINIC_OR_DEPARTMENT_OTHER): Payer: Medicare Other | Admitting: Hematology and Oncology

## 2019-05-09 ENCOUNTER — Other Ambulatory Visit: Payer: Self-pay

## 2019-05-09 DIAGNOSIS — G9529 Other cord compression: Secondary | ICD-10-CM

## 2019-05-09 DIAGNOSIS — C7951 Secondary malignant neoplasm of bone: Secondary | ICD-10-CM

## 2019-05-09 DIAGNOSIS — Z95828 Presence of other vascular implants and grafts: Secondary | ICD-10-CM

## 2019-05-09 DIAGNOSIS — C50911 Malignant neoplasm of unspecified site of right female breast: Secondary | ICD-10-CM

## 2019-05-09 DIAGNOSIS — C50411 Malignant neoplasm of upper-outer quadrant of right female breast: Secondary | ICD-10-CM | POA: Diagnosis not present

## 2019-05-09 DIAGNOSIS — Z5112 Encounter for antineoplastic immunotherapy: Secondary | ICD-10-CM | POA: Diagnosis not present

## 2019-05-09 LAB — CMP (CANCER CENTER ONLY)
ALT: 27 U/L (ref 0–44)
AST: 38 U/L (ref 15–41)
Albumin: 3.6 g/dL (ref 3.5–5.0)
Alkaline Phosphatase: 130 U/L — ABNORMAL HIGH (ref 38–126)
Anion gap: 9 (ref 5–15)
BUN: 13 mg/dL (ref 8–23)
CO2: 26 mmol/L (ref 22–32)
Calcium: 9.4 mg/dL (ref 8.9–10.3)
Chloride: 104 mmol/L (ref 98–111)
Creatinine: 0.73 mg/dL (ref 0.44–1.00)
GFR, Est AFR Am: >60 mL/min (ref >60)
GFR, Estimated: >60 mL/min (ref >60)
Glucose, Bld: 102 mg/dL — ABNORMAL HIGH (ref 70–99)
Potassium: 3.7 mmol/L (ref 3.5–5.1)
Sodium: 139 mmol/L (ref 135–145)
Total Bilirubin: 0.6 mg/dL (ref 0.3–1.2)
Total Protein: 7 g/dL (ref 6.5–8.1)

## 2019-05-09 LAB — CBC WITH DIFFERENTIAL (CANCER CENTER ONLY)
Abs Immature Granulocytes: 0.01 10*3/uL (ref 0.00–0.07)
Basophils Absolute: 0 10*3/uL (ref 0.0–0.1)
Basophils Relative: 1 %
Eosinophils Absolute: 0.1 10*3/uL (ref 0.0–0.5)
Eosinophils Relative: 2 %
HCT: 38 % (ref 36.0–46.0)
Hemoglobin: 12.5 g/dL (ref 12.0–15.0)
Immature Granulocytes: 0 %
Lymphocytes Relative: 11 %
Lymphs Abs: 0.3 10*3/uL — ABNORMAL LOW (ref 0.7–4.0)
MCH: 30.7 pg (ref 26.0–34.0)
MCHC: 32.9 g/dL (ref 30.0–36.0)
MCV: 93.4 fL (ref 80.0–100.0)
Monocytes Absolute: 0.3 10*3/uL (ref 0.1–1.0)
Monocytes Relative: 9 %
Neutro Abs: 2.3 10*3/uL (ref 1.7–7.7)
Neutrophils Relative %: 77 %
Platelet Count: 74 10*3/uL — ABNORMAL LOW (ref 150–400)
RBC: 4.07 MIL/uL (ref 3.87–5.11)
RDW: 15.6 % — ABNORMAL HIGH (ref 11.5–15.5)
WBC Count: 3 10*3/uL — ABNORMAL LOW (ref 4.0–10.5)
nRBC: 0 % (ref 0.0–0.2)

## 2019-05-09 MED ORDER — HEPARIN SOD (PORK) LOCK FLUSH 100 UNIT/ML IV SOLN
500.0000 [IU] | Freq: Once | INTRAVENOUS | Status: AC | PRN
  Administered 2019-05-09: 14:00:00 500 [IU]
  Filled 2019-05-09: qty 5

## 2019-05-09 MED ORDER — CYANOCOBALAMIN 1000 MCG/ML IJ SOLN
INTRAMUSCULAR | Status: AC
  Filled 2019-05-09: qty 1

## 2019-05-09 MED ORDER — ZOLEDRONIC ACID 4 MG/100ML IV SOLN
INTRAVENOUS | Status: AC
  Filled 2019-05-09: qty 100

## 2019-05-09 MED ORDER — DEXTROSE 5 % IV SOLN
Freq: Once | INTRAVENOUS | Status: AC
  Filled 2019-05-09: qty 250

## 2019-05-09 MED ORDER — PALONOSETRON HCL INJECTION 0.25 MG/5ML
0.2500 mg | Freq: Once | INTRAVENOUS | Status: AC
  Administered 2019-05-09: 0.25 mg via INTRAVENOUS

## 2019-05-09 MED ORDER — FAM-TRASTUZUMAB DERUXTECAN-NXKI CHEMO 100 MG IV SOLR
200.0000 mg | Freq: Once | INTRAVENOUS | Status: AC
  Administered 2019-05-09: 200 mg via INTRAVENOUS
  Filled 2019-05-09: qty 10

## 2019-05-09 MED ORDER — SODIUM CHLORIDE 0.9% FLUSH
10.0000 mL | INTRAVENOUS | Status: DC | PRN
  Administered 2019-05-09: 10 mL
  Filled 2019-05-09: qty 10

## 2019-05-09 MED ORDER — ACETAMINOPHEN 325 MG PO TABS
650.0000 mg | ORAL_TABLET | Freq: Once | ORAL | Status: AC
  Administered 2019-05-09: 650 mg via ORAL

## 2019-05-09 MED ORDER — SODIUM CHLORIDE 0.9% FLUSH
10.0000 mL | Freq: Once | INTRAVENOUS | Status: AC
  Administered 2019-05-09: 10 mL
  Filled 2019-05-09: qty 10

## 2019-05-09 MED ORDER — PALONOSETRON HCL INJECTION 0.25 MG/5ML
INTRAVENOUS | Status: AC
  Filled 2019-05-09: qty 5

## 2019-05-09 MED ORDER — ZOLEDRONIC ACID 4 MG/100ML IV SOLN
4.0000 mg | Freq: Once | INTRAVENOUS | Status: AC
  Administered 2019-05-09: 4 mg via INTRAVENOUS

## 2019-05-09 MED ORDER — CYANOCOBALAMIN 1000 MCG/ML IJ SOLN
1000.0000 ug | Freq: Once | INTRAMUSCULAR | Status: AC
  Administered 2019-05-09: 12:00:00 1000 ug via INTRAMUSCULAR

## 2019-05-09 MED ORDER — ACETAMINOPHEN 325 MG PO TABS
ORAL_TABLET | ORAL | Status: AC
  Filled 2019-05-09: qty 2

## 2019-05-09 NOTE — Assessment & Plan Note (Signed)
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 MRI 02/14/2015: T5 large destructive lesion with pathologic compression fracture and extensive epidural tumor, involvement of T4, excision metastatic carcinoma ER 60%, PR 0%, HER-2 positive ratio 2.71 average copy #7.45 03/27/2015: T4-6 decompression surgery for spinal cord compression PET-CT 12/10/17Rt para-spinal mass mildy hypermetabolic SUV 5.9, lytic cortical lesion 4.8 cm lesion left prox femur suv 8.8 favor osseus met disease Echo 04/12/2017 shows well preserved ejection fraction of 55-60%  Treatment summary: 1. Resumed anastrozole 05/21/2015, but it has caused significant hair loss so we switched her to exemestane 25 mg once daily 07/23/2015 2. Herceptin every 3 week started 04/10/2015 3. Bone metastases: Zometa every 3 months 4. Radiation therapy to the spine (Dr. Manning) 04/13/2015 to 05/19/2015 5. Radiation therapy to left femur 04/28/2016 to 05/12/2016  6. SRS to the brain lesion 11/24/2016 7.Kadcyla started 05/05/2017 discontinued 07/27/2017 8.Palbociclib +Faslodex +trastuzumab+ Pertuzumabstarted 08/10/2017, stopped 04/04/2018 9. Enhertu started 06/01/18 -------------------------------------------------------------------------------------------------------------------------- XRT to T123/04/2019 PET CT scan 10/03/2018:stable, improvement in subcutaneous mets MRI brain9/02/2019: no intraparenchymal metastases, calvarial metastases stable  Current treatment:Cycle16day 1Enhertu (Fam-trastuzumab-Durextecan) Echo 11/2018 shows EF of 55-60%.  Completed every 6 months.    Toxicities: 1. Severe fatigue-improved with dose reduction 2. Pain: controlled right now with Gabapentin. 3.Hair loss 4.  Thrombocytopenia: plt count remains stable.  Will continue as ordered.    Currently onevery 4-weekEnhertu We also plan to do scans every 6 monthswhich will be done in February 2021. She will return in 4 weeks for labs, f/u  

## 2019-05-09 NOTE — Patient Instructions (Signed)
[facility] Discharge Instructions for Patients Receiving Chemotherapy  Today you received the following chemotherapy agents: enhertu  To help prevent nausea and vomiting after your treatment, we encourage you to take your nausea medication as directed.   If you develop nausea and vomiting that is not controlled by your nausea medication, call the clinic.   BELOW ARE SYMPTOMS THAT SHOULD BE REPORTED IMMEDIATELY:  *FEVER GREATER THAN 100.5 F  *CHILLS WITH OR WITHOUT FEVER  NAUSEA AND VOMITING THAT IS NOT CONTROLLED WITH YOUR NAUSEA MEDICATION  *UNUSUAL SHORTNESS OF BREATH  *UNUSUAL BRUISING OR BLEEDING  TENDERNESS IN MOUTH AND THROAT WITH OR WITHOUT PRESENCE OF ULCERS  *URINARY PROBLEMS  *BOWEL PROBLEMS  UNUSUAL RASH Items with * indicate a potential emergency and should be followed up as soon as possible.  Feel free to call the clinic should you have any questions or concerns. The clinic phone number is [phone].  Please show the [place] at check-in to the Emergency Department and triage nurse.

## 2019-05-09 NOTE — Patient Instructions (Signed)

## 2019-05-09 NOTE — Progress Notes (Signed)
Okay to treat today with plts 74, per [name].

## 2019-05-15 ENCOUNTER — Ambulatory Visit (HOSPITAL_COMMUNITY)
Admission: RE | Admit: 2019-05-15 | Discharge: 2019-05-15 | Disposition: A | Discharge status: Home / Self Care | Payer: Medicare Other | Source: Ambulatory Visit | Attending: Internal Medicine | Admitting: Internal Medicine

## 2019-05-15 ENCOUNTER — Ambulatory Visit (HOSPITAL_BASED_OUTPATIENT_CLINIC_OR_DEPARTMENT_OTHER)
Admission: RE | Admit: 2019-05-15 | Discharge: 2019-05-15 | Disposition: A | Discharge status: Home / Self Care | Payer: Medicare Other | Source: Ambulatory Visit | Attending: Internal Medicine | Admitting: Internal Medicine

## 2019-05-15 ENCOUNTER — Other Ambulatory Visit: Payer: Self-pay

## 2019-05-15 ENCOUNTER — Encounter (HOSPITAL_COMMUNITY): Payer: Self-pay | Admitting: Internal Medicine

## 2019-05-15 VITALS — BP 131/58 | HR 77 | Wt 124.6 lb

## 2019-05-15 DIAGNOSIS — C7951 Secondary malignant neoplasm of bone: Secondary | ICD-10-CM

## 2019-05-15 DIAGNOSIS — G4700 Insomnia, unspecified: Secondary | ICD-10-CM | POA: Diagnosis not present

## 2019-05-15 DIAGNOSIS — Z170 Estrogen receptor positive status [ER+]: Secondary | ICD-10-CM | POA: Diagnosis not present

## 2019-05-15 DIAGNOSIS — Z886 Allergy status to analgesic agent status: Secondary | ICD-10-CM | POA: Insufficient documentation

## 2019-05-15 DIAGNOSIS — Z823 Family history of stroke: Secondary | ICD-10-CM | POA: Insufficient documentation

## 2019-05-15 DIAGNOSIS — C50911 Malignant neoplasm of unspecified site of right female breast: Secondary | ICD-10-CM

## 2019-05-15 DIAGNOSIS — Z79899 Other long term (current) drug therapy: Secondary | ICD-10-CM | POA: Diagnosis not present

## 2019-05-15 DIAGNOSIS — I119 Hypertensive heart disease without heart failure: Secondary | ICD-10-CM | POA: Diagnosis not present

## 2019-05-15 DIAGNOSIS — Z923 Personal history of irradiation: Secondary | ICD-10-CM | POA: Insufficient documentation

## 2019-05-15 DIAGNOSIS — Z5181 Encounter for therapeutic drug level monitoring: Secondary | ICD-10-CM | POA: Diagnosis not present

## 2019-05-15 DIAGNOSIS — R5381 Other malaise: Secondary | ICD-10-CM | POA: Diagnosis not present

## 2019-05-15 DIAGNOSIS — I080 Rheumatic disorders of both mitral and aortic valves: Secondary | ICD-10-CM

## 2019-05-15 DIAGNOSIS — Z888 Allergy status to other drugs, medicaments and biological substances status: Secondary | ICD-10-CM | POA: Insufficient documentation

## 2019-05-15 DIAGNOSIS — Z881 Allergy status to other antibiotic agents status: Secondary | ICD-10-CM | POA: Diagnosis not present

## 2019-05-15 DIAGNOSIS — Z885 Allergy status to narcotic agent status: Secondary | ICD-10-CM | POA: Insufficient documentation

## 2019-05-15 DIAGNOSIS — Z853 Personal history of malignant neoplasm of breast: Secondary | ICD-10-CM

## 2019-05-15 DIAGNOSIS — I10 Essential (primary) hypertension: Secondary | ICD-10-CM | POA: Diagnosis not present

## 2019-05-15 DIAGNOSIS — Z8042 Family history of malignant neoplasm of prostate: Secondary | ICD-10-CM | POA: Diagnosis not present

## 2019-05-15 DIAGNOSIS — Z9011 Acquired absence of right breast and nipple: Secondary | ICD-10-CM | POA: Diagnosis not present

## 2019-05-15 DIAGNOSIS — Z806 Family history of leukemia: Secondary | ICD-10-CM | POA: Diagnosis not present

## 2019-05-15 DIAGNOSIS — Z833 Family history of diabetes mellitus: Secondary | ICD-10-CM | POA: Insufficient documentation

## 2019-05-15 DIAGNOSIS — K219 Gastro-esophageal reflux disease without esophagitis: Secondary | ICD-10-CM | POA: Insufficient documentation

## 2019-05-15 NOTE — Patient Instructions (Signed)
Your physician has requested that you have an echocardiogram. Echocardiography is a painless test that uses sound waves to create images of your heart. It provides your doctor with information about the size and shape of your heart and how well your heart's chambers and valves are working. This procedure takes approximately one hour. There are no restrictions for this procedure.  Please follow up with the [facility] in 6 months with an echocardiogram. Please give [place] a call at [phone] option #3 in April 2021 in order to schedule that appointment.  At the [facility], you and your health needs are our priority. As part of our continuing mission to provide you with exceptional heart care, we have created designated Provider Care Teams. These Care Teams include your primary Cardiologist (physician) and Advanced Practice Providers (APPs- Physician Assistants and Nurse Practitioners) who all work together to provide you with the care you need, when you need it.   You may see any of the following providers on your designated Care Team at your next follow up: Marland Kitchen Dr Glori Bickers . Dr Loralie Champagne . Darrick Grinder, NP . Lyda Jester, PA . Audry Riles, PharmD   Please be sure to bring in all your medications bottles to every appointment.

## 2019-05-15 NOTE — Progress Notes (Signed)
CARDIO-ONCOLOGY CLINIC NOTE  Patient ID: Heather [name], female   DOB: Feb 08, 1946, 74 y.o.   MRN: [id number]  Referring Oncologist: Dr [name] Oncologistt: Dr [name] PCP: Dr [name] General Surgeon: Dr Marlou Starks  HPI: Heather [name] is a 74 year old female with invasive ductal carcinoma in R breast that is ER positive and HER-2/neu positive,  S/P R mastectomy and lymph node dissection 05/10/12.    She has been treated for HTN in the past but stopped taking the medications due to elevated renal function. Intolerant Ace-I due to cough.    She received a total of 6 cycles of Taxotere/carboplatin starting on 06/14/2012 and finishing in May. XRT finished in August 2014. Now receiving Herceptin every 3 weeks to end at the end of February 2015.    She finished initial Herceptin 2015 but unfortunately then developed pathological vertebral fracture with cord compression and paralysis. Now back on Herceptin since 12/16.  Found to have metastatic lesion to left femur. Underwent XRT.   Subsequently found to have a met to her 8th rib. Perjeta added to Herceptin starting in March 2018.    PET scan 03/2018 with progression of mets. Palbociclib +Faslodex +trastuzumab+ Pertuzumabstarted 08/10/2017, stopped 04/04/2018. Now on Enhertu (Fam-trastuzumab-Durextecan) started 06/01/18.   Here for f/u. Doing ok. Continues to lose weight. Remains on Enhertu every 4 weeks (she spaced it out from 3 weeks). No CP or SOB. Had CT on 12/27/18 with stable disease.   Echo today EF 60-65% Personally reviewed   Echo 12/19 EF 55-60% GLS - 16.8%   Echo 6/19 EF 55-60%  GLS -23.5% Personally reviewed  Echo 12/18 EF 55-60% Grade I DD GLS -21.3% LS 7.4 cm/s (poor window)   Past Medical History:  Diagnosis Date  . Anemia    hx of  . Anxiety    r/t updated surgery  . Breast cancer ([place])    right  . Cataract    immature-not sure of which eye  . Complication of anesthesia    pt states she is sensitive to meds  . Dizziness     has been going on for 87month;medical MD aware  . Genetic testing 07/11/2016   Test Results: Normal; No pathogenic mutations detected  Genes Analyzed: 43 genes on [employer] Common Cancers panel (APC, ATM, AXIN2, BARD1, BMPR1A, BRCA1, BRCA2, BRIP1, CDH1, CDKN2A, CHEK2, DICER1, EPCAM, GREM1, HOXB13, KIT, MEN1, MLH1, MSH2, MSH6, MUTYH, NBN, NF1, PALB2, PDGFRA, PMS2, POLD1, POLE, PTEN, RAD50, RAD51C, RAD51D, SDHA, SDHB, SDHC, SDHD, SMAD4, SMARCA4, STK11, TP53, TSC1, TSC2, VHL).  .Marland KitchenGERD (gastroesophageal reflux disease)    Tums prn  . H. pylori infection   . H/O hiatal hernia   . Hemorrhoids   . History of UTI   . Hx of radiation therapy 10/25/12- 12/11/12   right chest wall/regional lymph nodes 5040 cGy, 28 sessions, right chest wall boost 1000 cGy 1 session  . Hx: UTI (urinary tract infection)   . Hyperlipidemia    but not on meds;diet and exercise controlled  . Hypertension    recently started Aldactone   . Insomnia    takes Melatoniin daily  . Metastasis to spinal column ([facility])    T5, Left  Femur cancer- radiation   . Neuropathy    "from back surgery"  . PONV (postoperative nausea and vomiting)    pt experienced hair loss, confusion and combative- 02/15/15  . Right knee pain   . Right shoulder pain   . Skin cancer    squamous, Nodule to back - "  same cancer as breast."  . Urinary frequency    d/t taking Aldactone    Current Outpatient Medications  Medication Sig Dispense Refill  . ALPRAZolam (XANAX) 0.25 MG tablet Take 1 tablet by mouth as needed.    . Calcium Carbonate Antacid (TUMS PO) Take 2 tablets by mouth 2 (two) times daily as needed (acid reflux).    . Cholecalciferol (VITAMIN D) 2000 UNITS tablet Take 2,000 Units by mouth daily.    . cycloSPORINE (RESTASIS) 0.05 % ophthalmic emulsion 1 drop 2 (two) times daily.    . fluticasone (FLONASE) 50 MCG/ACT nasal spray     . gabapentin (NEURONTIN) 600 MG tablet Take 1 tablet (600 mg total) by mouth as directed. Take 1 tablet in the  AM and afternoon and 1 1/2 tabs at bedtime 315 tablet 2  . HYDROcodone-acetaminophen (NORCO/VICODIN) 5-325 MG tablet Take 1 tablet by mouth every 6 (six) hours as needed for moderate pain. 30 tablet 0  . lidocaine-prilocaine (EMLA) cream Apply to affected area once 30 g 3  . omeprazole (PRILOSEC) 40 MG capsule TAKE 1 CAPSULE BY MOUTH ONCE DAILY FOR 30 DAYS  5  . ondansetron (ZOFRAN) 8 MG tablet Take 1 tablet (8 mg total) by mouth every 8 (eight) hours as needed for nausea or vomiting. 20 tablet 0  . OVER THE COUNTER MEDICATION Place 1 drop into both eyes 2 (two) times daily as needed (dry eyes). Over the counter lubricating eye drops    . polyethylene glycol (MIRALAX / GLYCOLAX) packet Take 17 g by mouth daily as needed.    . Probiotic Product (PROBIOTIC DAILY PO) Take 1 capsule by mouth daily.     . prochlorperazine (COMPAZINE) 10 MG tablet Take 1 tablet (10 mg total) by mouth every 6 (six) hours as needed for nausea or vomiting. 60 tablet 3  . spironolactone (ALDACTONE) 25 MG tablet Take 25 mg by mouth daily.     No current facility-administered medications for this encounter.     Allergies  Allergen Reactions  . Other Itching    Chlorhexadine Cloth wipes  . Acyclovir And Related   . Chlorhexidine   . Zanaflex [Tizanidine Hcl] Hives    Bumps  . Codeine     " loopy".   02/26/15: Also patient does not want to take oxycodone either (makes her feel like a "zombie")  . Keflex [Cephalexin]     GI Upset  . Naprosyn [Naproxen] Other (See Comments)    GI ipset   . Oxycodone Other (See Comments)    Made her feel like a "zombie" 02/25/15. Patient does not want to take oxycodone.  . Tramadol Itching  . Tussin [Guaifenesin] Other (See Comments)    Dizzy     Social History   Socioeconomic History  . Marital status: Married    Spouse name: Not on file  . Number of children: 2  . Years of education: Not on file  . Highest education level: Not on file  Occupational History  . Not on  file  Tobacco Use  . Smoking status: Never Smoker  . Smokeless tobacco: Never Used  Substance and Sexual Activity  . Alcohol use: No  . Drug use: No  . Sexual activity: Yes    Birth control/protection: Post-menopausal  Other Topics Concern  . Not on file  Social History Narrative  . Not on file   Social Determinants of Health   Financial Resource Strain:   . Difficulty of Paying Living Expenses: Not on file  Food Insecurity:   . Worried About Charity fundraiser in the Last Year: Not on file  . Ran Out of Food in the Last Year: Not on file  Transportation Needs:   . Lack of Transportation (Medical): Not on file  . Lack of Transportation (Non-Medical): Not on file  Physical Activity:   . Days of Exercise per Week: Not on file  . Minutes of Exercise per Session: Not on file  Stress:   . Feeling of Stress : Not on file  Social Connections:   . Frequency of Communication with Friends and Family: Not on file  . Frequency of Social Gatherings with Friends and Family: Not on file  . Attends Religious Services: Not on file  . Active Member of Clubs or Organizations: Not on file  . Attends Archivist Meetings: Not on file  . Marital Status: Not on file  Intimate Partner Violence:   . Fear of Current or Ex-Partner: Not on file  . Emotionally Abused: Not on file  . Physically Abused: Not on file  . Sexually Abused: Not on file    Family History  Problem Relation Age of Onset  . Leukemia Mother 47       deceased 55  . Stroke Father   . Diabetes Mellitus II Father   . Prostate cancer Brother 40       currently 45  . Stomach cancer Maternal Grandmother 44       deceased 63  . Prostate cancer Brother 73       currently 42    PHYSICAL EXAM: Vitals:   05/15/19 1042  BP: (!) 131/58  Pulse: 77  SpO2: 100%   General:  Weak appearing but improved. No resp difficulty HEENT: normal Neck: supple. no JVD. Carotids 2+ bilat; no bruits. No lymphadenopathy or  thryomegaly appreciated. Cor: PMI nondisplaced. Regular rate & rhythm. No rubs, gallops or murmurs. Port on R Lungs: clear Abdomen: soft, nontender, nondistended. No hepatosplenomegaly. No bruits or masses. Good bowel sounds. Extremities: no cyanosis, clubbing, rash, edema Neuro: alert & orientedx3, cranial nerves grossly intact. moves all 4 extremities w/o difficulty. Affect pleasant   ASSESSMENT & PLAN: 1. R breast CA with metastatic disease  - initial Herceptin completed in 2015. - Palliative Herceptin start 12/16. Added Perjeta in 3/18 - Started Andria Frames early 2019  - PET scan 03/2018 with progression of mets. Now on Enhertu every 4 weeks - most recent scan on 8/20 stable  - I reviewed echos personally. EF and Doppler parameters stable. No HF on exam. Continue Herceptin. Repeat echo in 6 months.  2. HTN:  - stable  3. Debility - improved after HHPT/OT    Denham Mose,MD 11:25 AM

## 2019-05-15 NOTE — Addendum Note (Signed)
Encounter addended by: Marlise Eves, RN on: 05/15/2019 11:36 AM  Actions taken: Clinical Note Signed

## 2019-05-15 NOTE — Progress Notes (Signed)
  Echocardiogram 2D Echocardiogram has been performed.  Heather [name] 05/15/2019, 10:57 AM

## 2019-05-17 ENCOUNTER — Ambulatory Visit
Admission: RE | Admit: 2019-05-17 | Discharge: 2019-05-17 | Disposition: A | Discharge status: Home / Self Care | Payer: Medicare Other | Source: Ambulatory Visit | Attending: Radiation Oncology | Admitting: Radiation Oncology

## 2019-05-17 DIAGNOSIS — C7949 Secondary malignant neoplasm of other parts of nervous system: Secondary | ICD-10-CM

## 2019-05-17 DIAGNOSIS — C7931 Secondary malignant neoplasm of brain: Secondary | ICD-10-CM

## 2019-05-17 MED ORDER — GADOBENATE DIMEGLUMINE 529 MG/ML IV SOLN
11.0000 mL | Freq: Once | INTRAVENOUS | Status: AC | PRN
  Administered 2019-05-17: 11 mL via INTRAVENOUS

## 2019-05-21 ENCOUNTER — Inpatient Hospital Stay
Admission: RE | Admit: 2019-05-21 | Discharge: 2019-05-21 | Disposition: A | Discharge status: Home / Self Care | Payer: Medicare Other | Source: Ambulatory Visit | Attending: Urology | Admitting: Urology

## 2019-05-21 DIAGNOSIS — C7931 Secondary malignant neoplasm of brain: Secondary | ICD-10-CM

## 2019-05-21 DIAGNOSIS — C7951 Secondary malignant neoplasm of bone: Secondary | ICD-10-CM | POA: Diagnosis not present

## 2019-05-21 DIAGNOSIS — Z08 Encounter for follow-up examination after completed treatment for malignant neoplasm: Secondary | ICD-10-CM | POA: Diagnosis not present

## 2019-05-21 DIAGNOSIS — Z853 Personal history of malignant neoplasm of breast: Secondary | ICD-10-CM | POA: Diagnosis not present

## 2019-05-22 ENCOUNTER — Ambulatory Visit: Payer: Self-pay | Admitting: Urology

## 2019-05-22 NOTE — Progress Notes (Signed)
Radiation Oncology         [phone] ________________________________  Name: Heather [name] MRN: [id number]  Date: 05/21/2019  DOB: 08-21-1945  Follow up Visit Note- Conducted via telephone due to current COVID-19 concerns for limiting patient exposure  CC: Heather Stains, MD  [name], MD  Diagnosis:   74 y.o. woman with ER positive, PR negative, HER-2 positive metastatic breast cancer with multifocal metastases involving the spine, bony skeleton, lung, and brain.   Interval Since Last Radiation: 5.5 months 08/07/18:  SRS Spine/ The targeted metastases in theT12 and L2vertebral bodiesweretreatedto a prescription dose of 18Gy in 1 fraction.   05/14/2018 - 05/25/2018:  Chest Wall, Right Upper Posterior subcutaneous lesions / treated to 30 Gy delivered in 10 fractions of 3 Gy  05/18/2017 - 05/31/2017:  The thoracic spine (T7-T9) was treated to 30 Gy in 10 fractions of 3 Gy.  6/27-7/18/18 SRS and palliative treatment: 1.  The rib 8th was treated to 30 Gy in 10 fractions 2.  Right cerebellar 6 mm target was treated using 3 Dynamic Conformal Arcs to a prescription dose of 20 Gy in one fraction  04/27/16-05/12/16: Left femur/ 30 Gy in 10 fractions  04/13/2015-05/19/2015 Postop:  55 Gy in 25 fractions of 2.2 Gy to the thoracic spine  10/25/12-12/11/12: 50.4 Gy to the right chest wall and regional lymph nodes in 28 fractions with a 10 Gy boost to the right chest wall in 1 session.  Narrative:  In summary, Heather [name] is a well known patient to our service with a history of metastatic breast cancer originally treated in 2014 to the chest wall and regional lymph nodes. She developed recurrent disease within the thoracic spine and subsequently has undergone 3 surgeries, in addition to radiotherapy, with her most recent surgery being on 06/13/2016 where she had removal of thoracic hardware and replacement of pedicle screws with stabilization.  Of note previously placed screws between T3, T4  T6 and T7 were identified and dissection was carried out inferiorly to T8, T9-T10 and T11, T8 through T11 pedicle screw trajectories were placed bilaterally. She recovered well since treatment to her spine as she had been paralyzed in 2016, and with rigorous therapy has been able to make an impressive recovery. She continues on systemic therapy under the care of [name] and is also followed along in the brain and spine oncology conference. PET scan on 10/10/16 revealed a new hypermetabolic 2.5 x 1.1 cm lesion in the posterior right 8th rib. She also mentioned having new onset headaches, dizziness, and an episode of visual change,so an MRI brain was performed 11/04/16 demonstrating a solitary contrast-enhancing lesion within the right cerebellum, measuring 6 x 2 mm and lying just beneath the right tentorial leaflet and concerning for a metastatic lesion.  This solitary lesion was confirmed with SRS protocol MRI brain on 11/16/16 with no new lesions noted. This lesion was treated with SRS on 11/23/16 which she tolerated very well. She also completed palliative radiotherapy to the right eighth rib from 11/02/16 - 11/23/16 with significant improvement in her pain. She remained on Herceptin, Perjeta, Faslodex and Zometa for systemic disease under the care and direction of [name].  She developed increased pain in the T8 region and also had PET positive findings in this region, as well as hypermetabolic right lower lobe mass concerning for progressive metastatic disease on follow up imaging 04/18/2017, despite systemic treatment. Her systemic therapy was switched to Kadcyla on 05/05/17 with discontinuation of Faslodex, Herceptin, and  Perjeta.  She proceeded with palliative radiotherapy to 30 Gy in 10 fractions to the thoracic spine (T7-T9) from 05/18/2017 - 05/31/2017.  This treatment was tolerated very well.  A repeat brain MRI performed 06/15/17 showed no new or progressive contrast enhancing lesions and no residual  abnormality at the site of the previous treated right cerebellar lesion but Chest CT on 07/26/17 showed clear disease progression with interval increase in size of the multiple enhancing masses posteriorly overlying the spine, interval development of left axillary and mediastinal adenopathy, increase in the size of lytic lesions in T8-T9 and left ninth rib, as well as increased groundglass opacities right lower lobe lung. Therefore, she was switched to Palbociclib + Faslodex + trastuzumab + Pertuzumab, started 08/10/17.   MRI T-spine on 09/21/17 showed the known T12 lesion had slightly progressed by a few millimeters from 03/15/17 and there was a new lesion at L2 which was not present on lumbar MRI from 07/13/16. Follow up systemic imaging on 12/13/17, showed a good response to treatment with improved LAN and decreased subcutaneous nodules in the upper thoracic region.  Follow up brain MRI imaging has continued to show interval increase in size the right parietal calvarium metastasis with stable appearance in size of a right frontal calvarial metastasis and no parenchymal or dural metastases. PET scan from 04/03/2018 unfortunately showed significant disease progression despite systemic therapy.  Her systemic chemotherapy was discontinued on 04/04/2018 due to disease progression and she was switched to [facility] (Fam-trastuzumab-Durextecan) q 3 weeks beginning 06/01/18.  Repeat MRI imaging of the brain and thoracic spine was performed on 07/12/2018.  Brain imaging demonstrated 3 skull metastases ranging from 5 to 14 mm in diameter which appeared stable to slightly increased since December 2019 but no new metastases or brain parenchymal or dural metastases identified.  On thoracic spine imaging there was enlargement of the left paracentral T12 vertebral body metastasis increased from 14 mm in May 2019 to 20 mm currently without associated pathologic fracture and otherwise stable appearance of previously treated lesions in the  thoracic spine with no new disease noted.  The posterior subcutaneous nodular metastases appear regressed.  Her imaging studies were reviewed and discussed at the multidisciplinary brain and spine conference on 07/16/2018 and the consensus recommendation was to proceed with stereotactic radiosurgery [facility]) to the progressive disease at T12 which was completed on 08/07/18.  At the time of her CT [facility] for treatment planning, it was also noted that she had an enlarging lesion in the vertebral body of L2 as compared to her PET scan from 03/28/2018 so this lesion was included in the planning and was treated with a single fraction SRS at the time of T12 treatment as well. She tolerated the treatment well and did not experience any ill side effects.  Follow up systemic imaging with PET 10/03/18 showed a good response to treatment with less activity in the recently treated lesions at L2 and T12, no new bony lesions, as well as resolution of subcutaneous nodules in the upper back and pubic area and resolution of right adrenal hypermetabolic mass.  Her brain MRI from 10/13/18 was also stable with no visible intraparenchymal metastases to the brain and an unchanged appearance of 3 calvarial metastases.  INTERVAL HISTORY:   I spoke with the patient to conduct her routine scheduled 3 month follow up visit to review her recent brain MRI via telephone to spare the patient unnecessary potential exposure in the healthcare setting during the current COVID-19 pandemic.  The patient  was notified in advance and gave permission to proceed with this visit format.   She reports that she is doing well overall and has not experienced any ill side effects associated with her recent SRS spine treatment.  She denies back pain currently.  She has longstanding weakness in bilateral LEs, unchanged recently and she associates this with her inactivity due to pronounced fatigue following systemic treatments.  She reports that it begins to improve  approximately 2 weeks after her treatments when she is able to become more mobile but resumes following each treatment every 3 weeks.  She denies any new or increased paraesthesias in the lower extremities but reports neuropathy in bilateral UEs as well as "nerve pain" in the right axilla that travels down her right side/torso.  She also reports chronic right>left jaw pain and reports that her dentist has told her previously that she had some loss of bone in the right jaw based on xray evaluation.  She is taking Zometa q 3 months and will discuss this with [name].  She has continued her systemic therapy with ENHERTU under the care and direction of [name] having completed 11 cycles as of 12/28/2018 and the current plan is to continue on this regimen given her stability of systemic disease noted on recent CT C/A/P from 12/27/18. Today, we reviewed her recent MRI brain scan from 01/17/19 which also shows disease stability with no intraparenchymal lesions and stable enhancing lesions in the calvarium- no new lesions.  On review of systems, the patient reports that she continues doing well overall.  She continues with moderate fatigue which is tolerable and not significantly interfering with her daily activities aside from the first 2 weeks after systemic treatments.  She has tolerated her systemic therapy much better since her dose was reduced on 10/05/18.  She has persistent low grade nausea but no vomiting and is able to eat.  She remains as active as she can be.  She has noted continued improvement in the size of the subcutaneous nodules and in the burning/tingling pain that radiated from the left aspect of the spine underneath her breast since completing radiotherapy. She has not had recent changes in visual or auditory acuity, tinnitus, increased imbalance, tremor or seizure activity. She denies recent headaches, sternal chest pain, shortness of breath, cough, fevers, chills, night sweats, or recent  unintended weight changes. She denies any bowel or bladder disturbances, and denies abdominal pain or vomiting currently. She denies any new musculoskeletal or joint aches or pains, new skin lesions or concerns aside from those mentioned above in the HPI.  A complete review of systems is obtained and is otherwise negative.  Past Medical History:  Past Medical History:  Diagnosis Date  . Anemia    hx of  . Anxiety    r/t updated surgery  . Breast cancer ([place])    right  . Cataract    immature-not sure of which eye  . Complication of anesthesia    pt states she is sensitive to meds  . Dizziness    has been going on for 83month;medical MD aware  . Genetic testing 07/11/2016   Test Results: Normal; No pathogenic mutations detected  Genes Analyzed: 43 genes on [employer] Common Cancers panel (APC, ATM, AXIN2, BARD1, BMPR1A, BRCA1, BRCA2, BRIP1, CDH1, CDKN2A, CHEK2, DICER1, EPCAM, GREM1, HOXB13, KIT, MEN1, MLH1, MSH2, MSH6, MUTYH, NBN, NF1, PALB2, PDGFRA, PMS2, POLD1, POLE, PTEN, RAD50, RAD51C, RAD51D, SDHA, SDHB, SDHC, SDHD, SMAD4, SMARCA4, STK11, TP53, TSC1, TSC2, VHL).  .Marland Kitchen  GERD (gastroesophageal reflux disease)    Tums prn  . H. pylori infection   . H/O hiatal hernia   . Hemorrhoids   . History of UTI   . Hx of radiation therapy 10/25/12- 12/11/12   right chest wall/regional lymph nodes 5040 cGy, 28 sessions, right chest wall boost 1000 cGy 1 session  . Hx: UTI (urinary tract infection)   . Hyperlipidemia    but not on meds;diet and exercise controlled  . Hypertension    recently started Aldactone   . Insomnia    takes Melatoniin daily  . Metastasis to spinal column ([facility])    T5, Left  Femur cancer- radiation   . Neuropathy    "from back surgery"  . PONV (postoperative nausea and vomiting)    pt experienced hair loss, confusion and combative- 02/15/15  . Right knee pain   . Right shoulder pain   . Skin cancer    squamous, Nodule to back - "same cancer as breast."  . Urinary frequency      d/t taking Aldactone    Past Surgical History: Past Surgical History:  Procedure Laterality Date  . COLONOSCOPY    . cyst removed from left breast  1970  . DECOMPRESSIVE LUMBAR LAMINECTOMY LEVEL 4 N/A 02/15/2015   Procedure: DECOMPRESSION T5 AND T3-T7 STABALIZATION;  Surgeon: Consuella Lose, MD;  Location: [place] [facility];  Service: Neurosurgery;  Laterality: N/A;  . ESOPHAGOGASTRODUODENOSCOPY    . LAMINECTOMY N/A 03/27/2015   Procedure: Thoracic Four-Thoracic Six Laminectomy for tumor;  Surgeon: Consuella Lose, MD;  Location: [place] [facility];  Service: Neurosurgery;  Laterality: N/A;  T4-T6 Laminectomy  . left wrist surgery   2004   with plate  . MASTECTOMY MODIFIED RADICAL  05/10/2012   Procedure: MASTECTOMY MODIFIED RADICAL;  Surgeon: Merrie Roof, MD;  Location: [place];  Service: General;  Laterality: Right;  RIGHT MODIFIED RADICAL MASTECTOMY  . MASTECTOMY, RADICAL Right   . MINOR BREAST BIOPSY Left 05/16/2016   Procedure: EXCISION OF BACK NODULE;  Surgeon: Autumn Messing III, MD;  Location: [place];  Service: General;  Laterality: Left;  . Nodule  back  05/17/2015  . PORTACATH PLACEMENT  05/10/2012   Procedure: INSERTION PORT-A-CATH;  Surgeon: Merrie Roof, MD;  Location: [facility] OR;  Service: General;  Laterality: Left;    Social History:  Social History   Socioeconomic History  . Marital status: Married    Spouse name: Not on file  . Number of children: 2  . Years of education: Not on file  . Highest education level: Not on file  Occupational History  . Not on file  Tobacco Use  . Smoking status: Never Smoker  . Smokeless tobacco: Never Used  Substance and Sexual Activity  . Alcohol use: No  . Drug use: No  . Sexual activity: Yes    Birth control/protection: Post-menopausal  Other Topics Concern  . Not on file  Social History Narrative  . Not on file   Social Determinants of Health   Financial Resource Strain:   . Difficulty of Paying Living  Expenses: Not on file  Food Insecurity:   . Worried About Charity fundraiser in the Last Year: Not on file  . Ran Out of Food in the Last Year: Not on file  Transportation Needs:   . Lack of Transportation (Medical): Not on file  . Lack of Transportation (Non-Medical): Not on file  Physical Activity:   . Days of Exercise  per Week: Not on file  . Minutes of Exercise per Session: Not on file  Stress:   . Feeling of Stress : Not on file  Social Connections:   . Frequency of Communication with Friends and Family: Not on file  . Frequency of Social Gatherings with Friends and Family: Not on file  . Attends Religious Services: Not on file  . Active Member of Clubs or Organizations: Not on file  . Attends Archivist Meetings: Not on file  . Marital Status: Not on file  Intimate Partner Violence:   . Fear of Current or Ex-Partner: Not on file  . Emotionally Abused: Not on file  . Physically Abused: Not on file  . Sexually Abused: Not on file    Family History: Family History  Problem Relation Age of Onset  . Leukemia Mother 73       deceased 47  . Stroke Father   . Diabetes Mellitus II Father   . Prostate cancer Brother 65       currently 66  . Stomach cancer Maternal Grandmother 64       deceased 38  . Prostate cancer Brother 4       currently 91     ALLERGIES:  is allergic to other; acyclovir and related; chlorhexidine; zanaflex [tizanidine hcl]; codeine; keflex [cephalexin]; naprosyn [naproxen]; oxycodone; tramadol; and tussin [guaifenesin].  Meds: Current Outpatient Medications  Medication Sig Dispense Refill  . ALPRAZolam (XANAX) 0.25 MG tablet Take 1 tablet by mouth as needed.    . Calcium Carbonate Antacid (TUMS PO) Take 2 tablets by mouth 2 (two) times daily as needed (acid reflux).    . Cholecalciferol (VITAMIN D) 2000 UNITS tablet Take 2,000 Units by mouth daily.    . cycloSPORINE (RESTASIS) 0.05 % ophthalmic emulsion 1 drop 2 (two) times daily.    .  fluticasone (FLONASE) 50 MCG/ACT nasal spray     . gabapentin (NEURONTIN) 600 MG tablet Take 1 tablet (600 mg total) by mouth as directed. Take 1 tablet in the AM and afternoon and 1 1/2 tabs at bedtime 315 tablet 2  . HYDROcodone-acetaminophen (NORCO/VICODIN) 5-325 MG tablet Take 1 tablet by mouth every 6 (six) hours as needed for moderate pain. 30 tablet 0  . lidocaine-prilocaine (EMLA) cream Apply to affected area once 30 g 3  . omeprazole (PRILOSEC) 40 MG capsule TAKE 1 CAPSULE BY MOUTH ONCE DAILY FOR 30 DAYS  5  . ondansetron (ZOFRAN) 8 MG tablet Take 1 tablet (8 mg total) by mouth every 8 (eight) hours as needed for nausea or vomiting. 20 tablet 0  . OVER THE COUNTER MEDICATION Place 1 drop into both eyes 2 (two) times daily as needed (dry eyes). Over the counter lubricating eye drops    . polyethylene glycol (MIRALAX / GLYCOLAX) packet Take 17 g by mouth daily as needed.    . Probiotic Product (PROBIOTIC DAILY PO) Take 1 capsule by mouth daily.     . prochlorperazine (COMPAZINE) 10 MG tablet Take 1 tablet (10 mg total) by mouth every 6 (six) hours as needed for nausea or vomiting. 60 tablet 3  . spironolactone (ALDACTONE) 25 MG tablet Take 25 mg by mouth daily.     No current facility-administered medications for this encounter.    Physical Findings:  Unable to assess due to telephone visit format.   Lab Findings: Lab Results  Component Value Date   WBC 3.0 (L) 05/09/2019   HGB 12.5 05/09/2019   HCT 38.0 05/09/2019  MCV 93.4 05/09/2019   PLT 74 (L) 05/09/2019     Radiographic Findings: MR Brain W Wo Contrast  Result Date: 05/17/2019 CLINICAL DATA:  Assess for treatment response. Metastatic breast cancer. S RS restaging. EXAM: MRI HEAD WITHOUT AND WITH CONTRAST TECHNIQUE: Multiplanar, multiecho pulse sequences of the brain and surrounding structures were obtained without and with intravenous contrast. CONTRAST:  52m MULTIHANCE GADOBENATE DIMEGLUMINE 529 MG/ML IV SOLN  COMPARISON:  01/17/2019 FINDINGS: Brain: Slowly enlarging metastasis at the superior cerebellar vermis measures 4 mm. This can be seen in retrospect as a 2 mm finding on the previous study, of low conspicuity. No other visible brain metastasis. Elsewhere, T1 hyperintensity in the dentate nuclei relates to gadolinium deposition. Mild chronic small-vessel change within the hemispheric white matter is unchanged. No hemorrhage, hydrocephalus or extra-axial collection. Vascular: Major vessels at the base of the brain show flow. Skull and upper cervical spine: Stable calvarial lesions compared to multiple previous exams. 1 cm lesion of the calvarium at the anterior aspect of the middle cranial fossa. 5 mm right frontal convexity metastasis shows no signs of growth. Right posterior parietal metastasis shows no sign of growth. Sinuses/Orbits: Clear/normal Other: None IMPRESSION: Slowly enlarging metastasis at the superior cerebellar vermis, 4 mm today. No mass effect or edema. Three known calvarial metastases do not show any growth, as described above. Electronically Signed   By: MNelson ChimesM.D.   On: 05/17/2019 14:04   ECHOCARDIOGRAM COMPLETE  Result Date: 05/15/2019   ECHOCARDIOGRAM REPORT   Patient Name:   EALLURE GREASERDate of Exam: 05/15/2019 Medical Rec #:  [id number]    Height:       64.0 in Accession #:    [id number]   Weight:       125.5 lb Date of Birth:  [birth year]    BSA:          1.60 m Patient Age:    89years      BP:           149/76 mmHg Patient Gender: F             HR:           70 bpm. Exam Location:  Outpatient Procedure: 2D Echo and 3D Echo Indications:    chemotherapy evaluation  History:        Patient has prior history of Echocardiogram examinations, most                 recent 11/08/2018.  Sonographer:    LJohny ChessRDCS Referring Phys: 2[place] 1. Left ventricular ejection fraction, by visual estimation, is 60 to 65%. The left ventricle has normal function.  There is no left ventricular hypertrophy.  2. Left ventricular diastolic parameters are consistent with Grade I diastolic dysfunction (impaired relaxation).  3. The left ventricle has no regional wall motion abnormalities.  4. Global right ventricle has normal systolic function.The right ventricular size is normal. No increase in right ventricular wall thickness.  5. Left atrial size was mildly dilated.  6. Right atrial size was normal.  7. The mitral valve is normal in structure. Mild mitral valve regurgitation.  8. The tricuspid valve is normal in structure.  9. The aortic valve is tricuspid. Aortic valve regurgitation is not visualized. Mild aortic valve sclerosis without stenosis. 10. The pulmonic valve was grossly normal. Pulmonic valve regurgitation is not visualized. 11. Mildly elevated pulmonary artery systolic pressure. 12. The inferior vena  cava is normal in size with greater than 50% respiratory variability, suggesting right atrial pressure of 3 mmHg. 13. The interatrial septum was not assessed. FINDINGS  Left Ventricle: Left ventricular ejection fraction, by visual estimation, is 60 to 65%. The left ventricle has normal function. The left ventricle has no regional wall motion abnormalities. There is no left ventricular hypertrophy. Left ventricular diastolic parameters are consistent with Grade I diastolic dysfunction (impaired relaxation). Right Ventricle: The right ventricular size is normal. No increase in right ventricular wall thickness. Global RV systolic function is has normal systolic function. The tricuspid regurgitant velocity is 2.69 m/s, and with an assumed right atrial pressure  of 3 mmHg, the estimated right ventricular systolic pressure is mildly elevated at 31.9 mmHg. Left Atrium: Left atrial size was mildly dilated. Right Atrium: Right atrial size was normal in size Pericardium: There is no evidence of pericardial effusion. Mitral Valve: The mitral valve is normal in structure. Mild  mitral valve regurgitation. Tricuspid Valve: The tricuspid valve is normal in structure. Tricuspid valve regurgitation is trivial. Aortic Valve: The aortic valve is tricuspid. Aortic valve regurgitation is not visualized. Mild aortic valve sclerosis is present, with no evidence of aortic valve stenosis. Pulmonic Valve: The pulmonic valve was grossly normal. Pulmonic valve regurgitation is not visualized. Pulmonic regurgitation is not visualized. Aorta: The aortic root and ascending aorta are structurally normal, with no evidence of dilitation. Venous: The inferior vena cava is normal in size with greater than 50% respiratory variability, suggesting right atrial pressure of 3 mmHg. IAS/Shunts: The interatrial septum was not assessed.  LEFT VENTRICLE PLAX 2D LVIDd:         4.20 cm  Diastology LVIDs:         2.90 cm  LV e' lateral:   10.90 cm/s LV PW:         0.70 cm  LV E/e' lateral: 8.7 LV IVS:        0.70 cm  LV e' medial:    9.36 cm/s LVOT diam:     1.80 cm  LV E/e' medial:  10.2 LV SV:         46 ml LV SV Index:   28.89    2D Longitudinal Strain LVOT Area:     2.54 cm 2D Strain GLS Avg:     -18.6 %  RIGHT VENTRICLE RV S prime:     13.20 cm/s TAPSE (M-mode): 2.1 cm LEFT ATRIUM             Index       RIGHT ATRIUM          Index LA diam:        2.80 cm 1.74 cm/m  RA Area:     9.06 cm LA Vol (A2C):   38.5 ml 23.99 ml/m RA Volume:   16.00 ml 9.97 ml/m LA Vol (A4C):   31.2 ml 19.44 ml/m LA Biplane Vol: 35.2 ml 21.93 ml/m  AORTIC VALVE LVOT Vmax:   102.00 cm/s LVOT Vmean:  72.600 cm/s LVOT VTI:    0.245 m  AORTA Ao Root diam: 2.60 cm MITRAL VALVE                         TRICUSPID VALVE MV Area (PHT): 3.53 cm              TR Peak grad:   28.9 mmHg MV PHT:        62.35 msec  TR Vmax:        269.00 cm/s MV Decel Time: 215 msec MV E velocity: 95.10 cm/s  103 cm/s  SHUNTS MV A velocity: 114.00 cm/s 70.3 cm/s Systemic VTI:  0.24 m MV E/A ratio:  0.83        1.5       Systemic Diam: 1.80 cm  Glori Bickers  MD Electronically signed by Glori Bickers MD Signature Date/Time: 05/15/2019/11:21:07 AM    Final     Impression/Plan:  This visit was conducted via telephone to spare the patient unnecessary potential exposure in the healthcare setting during the current COVID-19 pandemic. 45. 74 yo woman with ER positive breast cancer with metastatic disease to the brain, bones and spine.  Regarding her metastatic brain disease, she remains clinically stable and without complaints.  However, her most recent brain MRI from 05/17/19 does show a slowly enlarging metastasis at the superior cerebellar vermis, measuring 4 mm as compared to 2 mm on prior MRI brain from 01/17/19. There is no mass effect or edema and an unchanged, stable appearance of 3 calvarial metastases. Her imaging was reviewed at the recent multidisciplinary brain and spine conference and consensus recommendation is to proceed with a single fraction of stereotactic radiosurgery [facility]) to this enlarging lesion.  We discussed this today. She is familiar with radiotherapy and has had a previous course of SRS to the brain in 2018 so we reviewed the available radiation techniques, and focused on the details and logistics of delivery as it pertains to [facility]. We reviewed the anticipated acute and late sequelae associated with radiation in this setting. The patient was encouraged to ask questions that were answered to her satisfaction. At the conclusion of our conversation, she elects to proceed with a single fraction of SRS to the 19m lesion in the cerebellar vermis, as recommended.  She will have CT SIM/treatment planning visit at 10am on Thursday, 05/23/19, in anticipation of proceeding with treatment in the near future.  We will coordinate her treatment with her neurosurgeon, Dr. NKathyrn Sheriffwho will also participate in the planning and delivery of treatment. Written consent will be [facility] at the [facility] a [facility] of this document will be placed in her medical  record. She is comfortable and in agreement with this plan and we will share our discussion with Dr. GLindi Adieas well to keep him informed of her recent imaging findings and our treatment plans. Regarding her systemic disease, she will continue on her current chemotherapy regimen with routine follow-up for disease management under the care and direction of Dr. GLindi Adie   2. Spine metastases.  She has recovered well from the effects of her most recent SKanevilletreatment to T12 and L2 and is currently without complaints.  She continues to prefer to have this followed with systemic imaging under the care and direction of Dr. GLindi Adieand only repeat spine MRI if she becomes symptomatic.  We have previously discussed that CT imaging is not the ideal imaging modailty for evaluation of the spine but certainly understand her concerns for limiting the number of imaging studies that she is subjected to on a routine basis.  She understands and accepts the associated risks of missing a very small spinal lesion on CT.  Her recent CT C/A/P imaging from 12/27/18 showed continued disease stability with no new or progressive interval findings. The previously characterized mixed lytic/sclerotic lesions in the T12 and L2 vertebral bodies are not substantially changed in the interval and there  are no new osseous lesions identified. She is a most reliable patient and will let us know if she becomes symptomatic in the spine so that we can more thoroughly evaluate with MRI if/when necessary.  She appears to have a good understanding of her overall disease and our recommendations and is in agreement with the stated plan.  She knows to call at anytime in the interim with any questions or concerns.  Given current concerns for patient exposure during the COVID-19 pandemic, this encounter was conducted via telephone. The patient was notified in advance and was offered a [place] meeting to allow for face to face communication but unfortunately reported  that she did not have the appropriate resources/technology to support such a visit and instead preferred to proceed with telephone consult. The patient has given verbal consent for this type of encounter. The time spent during this encounter was 35 minutes with approximately 50% of that time spent in counseling and coordination of care. The attendants for this meeting include Lindzee Gouge PA-C, Mont Dutton, RT-RT, and patient, Heather [name]. During the encounter, Jacalyn Biggs PA-C and Mont Dutton, RT-RT were located at [facility] Radiation Oncology Department.  Patient, Heather [name] was located at home.   Nicholos Johns, PA-C

## 2019-05-23 ENCOUNTER — Other Ambulatory Visit: Payer: Self-pay

## 2019-05-23 ENCOUNTER — Ambulatory Visit
Admission: RE | Admit: 2019-05-23 | Discharge: 2019-05-23 | Disposition: A | Discharge status: Home / Self Care | Payer: Medicare Other | Source: Ambulatory Visit | Attending: Radiation Oncology | Admitting: Radiation Oncology

## 2019-05-23 VITALS — BP 125/64 | HR 75 | Temp 98.2°F | Resp 18 | Ht 64.0 in | Wt 124.1 lb

## 2019-05-23 VITALS — BP 125/64 | HR 75 | Temp 98.2°F | Resp 18 | Ht 64.0 in | Wt 124.2 lb

## 2019-05-23 DIAGNOSIS — C7931 Secondary malignant neoplasm of brain: Secondary | ICD-10-CM | POA: Diagnosis not present

## 2019-05-23 DIAGNOSIS — Z510 Encounter for antineoplastic radiation therapy: Secondary | ICD-10-CM | POA: Insufficient documentation

## 2019-05-23 DIAGNOSIS — C7949 Secondary malignant neoplasm of other parts of nervous system: Secondary | ICD-10-CM

## 2019-05-23 MED ORDER — SODIUM CHLORIDE 0.9% FLUSH
10.0000 mL | Freq: Once | INTRAVENOUS | Status: AC
  Administered 2019-05-23: 10 mL via INTRAVENOUS

## 2019-05-23 NOTE — Progress Notes (Signed)
Has armband been applied?  Yes.    Does patient have an allergy to IV contrast dye?: No.   Has patient ever received premedication for IV contrast dye?: No.   Does patient take metformin?: No.  If patient does take metformin when was the last dose: n/a    IV site: antecubital left, condition no redness  Has IV site been added to flowsheet?  Yes.    BP 125/64   Pulse 75   Temp 98.2 F (36.8 C)   Resp 18   Ht 5\' 4"  (1.626 m)   Wt 124 lb 3.2 oz (56.3 kg)   SpO2 100%   BMI 21.32 kg/m

## 2019-05-26 NOTE — Progress Notes (Signed)
  Radiation Oncology         [phone] ________________________________  Name: Heather [name] MRN: [id number]  Date: 05/23/2019  DOB: [birth year]  SIMULATION AND TREATMENT PLANNING NOTE    ICD-10-CM   1. Brain metastasis ([place])  C79.31     DIAGNOSIS:  74 yo woman with a cerebellar metastasis  NARRATIVE:  The patient was brought to the [place].  Identity was confirmed.  All relevant records and images related to the planned course of therapy were reviewed.  The patient freely provided informed written consent to proceed with treatment after reviewing the details related to the planned course of therapy. The consent form was witnessed and verified by the simulation staff. Intravenous access was established for contrast administration. Then, the patient was set-up in a stable reproducible supine position for radiation therapy.  A relocatable thermoplastic stereotactic head frame was fabricated for precise immobilization.  CT images were obtained.  Surface markings were placed.  The CT images were loaded into the planning software and fused with the patient's targeting MRI scan.  Then the target and avoidance structures were contoured.  Treatment planning then occurred.  The radiation prescription was entered and confirmed.  I have requested 3D planning  I have requested a DVH of the following structures: Brain stem, brain, left eye, right eye, lenses, optic chiasm, target volumes, uninvolved brain, and normal tissue.    SPECIAL TREATMENT PROCEDURE:  The planned course of therapy using radiation constitutes a special treatment procedure. Special care is required in the management of this patient for the following reasons. This treatment constitutes a Special Treatment Procedure for the following reason: High dose per fraction requiring special monitoring for increased toxicities of treatment including daily imaging.  The special nature of the planned course of radiotherapy will  require increased physician supervision and oversight to ensure patient's safety with optimal treatment outcomes.  PLAN:  The patient will receive 20 Gy in 1 fraction.  ________________________________  Sheral Apley [name], M.D.

## 2019-05-27 DIAGNOSIS — Z510 Encounter for antineoplastic radiation therapy: Secondary | ICD-10-CM | POA: Diagnosis not present

## 2019-05-27 DIAGNOSIS — C7931 Secondary malignant neoplasm of brain: Secondary | ICD-10-CM | POA: Diagnosis not present

## 2019-05-30 DIAGNOSIS — Z23 Encounter for immunization: Secondary | ICD-10-CM | POA: Diagnosis not present

## 2019-05-31 ENCOUNTER — Encounter: Payer: Self-pay | Admitting: Radiation Oncology

## 2019-05-31 ENCOUNTER — Ambulatory Visit
Admission: RE | Admit: 2019-05-31 | Discharge: 2019-05-31 | Disposition: A | Discharge status: Home / Self Care | Payer: Medicare Other | Source: Ambulatory Visit | Attending: Radiation Oncology | Admitting: Radiation Oncology

## 2019-05-31 ENCOUNTER — Other Ambulatory Visit: Payer: Self-pay

## 2019-05-31 VITALS — BP 155/83 | HR 82 | Temp 97.9°F | Resp 20

## 2019-05-31 DIAGNOSIS — C7931 Secondary malignant neoplasm of brain: Secondary | ICD-10-CM | POA: Diagnosis not present

## 2019-05-31 DIAGNOSIS — Z510 Encounter for antineoplastic radiation therapy: Secondary | ICD-10-CM | POA: Diagnosis not present

## 2019-05-31 NOTE — Op Note (Signed)
Name: Heather [name]    MRN: [id number]   Date: 05/31/2019    DOB: 05-30-1945   STEREOTACTIC RADIOSURGERY OPERATIVE NOTE  PRE-OPERATIVE DIAGNOSIS:  Metastatic lung CA  POST-OPERATIVE DIAGNOSIS:  Same  PROCEDURE:  Stereotactic Radiosurgery  SURGEON:  Consuella Lose, MD  RADIATION ONCOLOGIST: Dr. Tyler Pita, MD  TECHNIQUE:  The patient underwent a radiation treatment planning session in the radiation oncology simulation suite under the care of the radiation oncology physician and physicist.  I participated closely in the radiation treatment planning afterwards. The patient underwent planning CT which was fused to 3T high resolution MRI with 1 mm axial slices.  These images were fused on the planning system.  We contoured the gross target volumes and subsequently expanded this to yield the Planning Target Volume. I actively participated in the planning process.  I helped to define and review the target contours and also the contours of the optic pathway, eyes, brainstem and selected nearby organs at risk.  All the dose constraints for critical structures were reviewed and compared to AAPM Task Group 101.  The prescription dose conformity was reviewed.  I approved the plan electronically.    Accordingly, Heather [name]  was brought to the TrueBeam stereotactic radiation treatment linac and placed in the custom immobilization mask.  The patient was aligned according to the IR fiducial markers with BrainLab Exactrac, then orthogonal x-rays were used in ExacTrac with the 6DOF robotic table and the shifts were made to align the patient  Heather [name] received stereotactic radiosurgery to a prescription dose of 20Gy to the cerebellar vermian lesion uneventfully.    The detailed description of the procedure is recorded in the radiation oncology procedure note.  I was present for the duration of the procedure.  DISPOSITION:   Following delivery, the patient was transported to nursing in stable  condition and monitored for possible acute effects to be discharged to home in stable condition with follow-up in one month.  Consuella Lose, MD [facility]

## 2019-06-01 NOTE — Progress Notes (Signed)
  Radiation Oncology         [phone] ________________________________  Stereotactic Treatment Procedure Note  Name: Heather [name] MRN: [id number]  Date: 05/31/2019  DOB: 31-Mar-1946  SPECIAL TREATMENT PROCEDURE    ICD-10-CM   1. Brain metastasis ([place])  C79.31     3D TREATMENT PLANNING AND DOSIMETRY:  The patient's radiation plan was reviewed and approved by neurosurgery and radiation oncology prior to treatment.  It showed 3-dimensional radiation distributions overlaid onto the planning CT/MRI image set.  The [facility] [place] for the target structures as well as the organs at risk were reviewed. The documentation of the 3D plan and dosimetry are filed in the radiation oncology EMR.  NARRATIVE:  Heather [name] was brought to the TrueBeam stereotactic radiation treatment machine and placed supine on the CT couch. The head frame was applied, and the patient was set up for stereotactic radiosurgery.  Neurosurgery was present for the set-up and delivery  SIMULATION VERIFICATION:  In the couch zero-angle position, the patient underwent Exactrac imaging using the Brainlab system with orthogonal KV images.  These were carefully aligned and repeated to confirm treatment position for each of the isocenters.  The Exactrac snap film verification was repeated at each couch angle.  PROCEDURE: Heather [name] received stereotactic radiosurgery to the following targets: The central cerebellar 68mm target was treated using 3 Dynamic Conformal Arcs to a prescription dose of 20 Gy.  ExacTrac registration was performed for each couch angle.  The 100% isodose line was prescribed.  6 MV X-rays were delivered in the flattening filter free beam mode.  STEREOTACTIC TREATMENT MANAGEMENT:  Following delivery, the patient was transported to nursing in stable condition and monitored for possible acute effects.  Vital signs were recorded BP (!) 155/83 (BP Location: Left Arm, Patient Position: Sitting, Cuff Size: Normal)   Pulse 82    Temp 97.9 F (36.6 C)   Resp 20   SpO2 100% . The patient tolerated treatment without significant acute effects, and was discharged to home in stable condition.    PLAN: Follow-up in one month.  ________________________________  Sheral Apley. [name], M.D.

## 2019-06-02 NOTE — Progress Notes (Signed)
  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  ExacTrac registration was performed for each couch angle.  The 100% isodose line was prescribed.  6 MV X-rays were delivered in the flattening filter free beam mode.  Narrative: The patient tolerated radiation treatment relatively well.     Plan: The patient has completed radiation treatment. The patient will return to radiation oncology clinic for routine followup in one month. I advised her to call or return sooner if she has any questions or concerns related to her recovery or treatment. ________________________________  Sheral Apley. [name], M.D.

## 2019-06-04 DIAGNOSIS — H04123 Dry eye syndrome of bilateral lacrimal glands: Secondary | ICD-10-CM | POA: Diagnosis not present

## 2019-06-04 DIAGNOSIS — H2513 Age-related nuclear cataract, bilateral: Secondary | ICD-10-CM | POA: Diagnosis not present

## 2019-06-04 DIAGNOSIS — H35372 Puckering of macula, left eye: Secondary | ICD-10-CM | POA: Diagnosis not present

## 2019-06-04 DIAGNOSIS — H25013 Cortical age-related cataract, bilateral: Secondary | ICD-10-CM | POA: Diagnosis not present

## 2019-06-04 DIAGNOSIS — H25043 Posterior subcapsular polar age-related cataract, bilateral: Secondary | ICD-10-CM | POA: Diagnosis not present

## 2019-06-06 NOTE — Progress Notes (Signed)
Patient Care Team: [name], MD as PCP - General (Family Medicine)  DIAGNOSIS:    ICD-10-CM   1. Primary cancer of upper outer quadrant of right female breast ([place])  C50.411 CT Abdomen Pelvis W Contrast    CT Chest W Contrast    SUMMARY OF ONCOLOGIC HISTORY: Oncology History  Primary cancer of upper outer quadrant of right female breast ([place])  03/08/2012 Initial Diagnosis   Right breast invasive ductal carcinoma ER positive PR negative HER-2 positive Ki-67 44%; another breast mass biopsied in the anterior part of the breast which was also positive for malignancy that was HER-2 negative   03/14/2012 Cancer Staging   Staging form: Breast, AJCC 7th Edition - Pathologic: Stage IV (M1) - Signed by [name], NP on 06/22/2018   05/10/2012 Surgery   Right mastectomy and axillary lymph node dissection: Multifocal disease 5 cm, 1.7 cm, 1.6 cm, ER positive PR negative HER-2 positive Ki-67 44%, 3/7 lymph nodes positive   06/14/2012 - 06/12/2013 Chemotherapy   Adjuvant chemotherapy with [place] 6 followed by Herceptin maintenance   10/25/2012 - 12/11/2012 Radiation Therapy   Adjuvant radiation therapy   02/07/2013 -  Anti-estrogen oral therapy   Letrozole 2.5 mg daily   02/14/2015 Imaging   MRI spine: Large destructive T5 lesion with severe pathologic compression fracture and extensive epidural tumor, severe spinal stenosis with moderate cord compression, tumor involvement of T4   03/18/2015 PET scan   Residual enhancing soft tissue adjacent to the spinal cord. No evidence of metastatic disease. Nonspecific uptake the left nipple   03/27/2015 - 03/30/2015 Hospital Admission   T4-6 decompression for spinal cord compression (lower extremity paralysis)   04/10/2015 -  Chemotherapy   Palliative treatment with Herceptin every 3 weeks with letrozole 2.5 mg daily   04/13/2015 - 05/19/2015 Radiation Therapy   Palliative radiation treatment to the spine   09/01/2015 Imaging   CT chest  abdomen pelvis: Pathologic fracture with posterior fusion at T5 no other evidence of metastatic disease in the chest abdomen pelvis   12/23/2015 Imaging   CT CAP: new nonspecific 0.6 cm lymph node was stated mediastinum needs follow-up CT, innumerable tiny groundglass pulmonary nodules throughout both lungs unchanged, patchy consolidation from radiation, T5 fracture, no mets   04/15/2016 PET scan   Rt para-spinal mass mildy hypermetabolic SUV 5.9, lytic cortical lesion 4.8 cm lesion left prox femur suv 8.8 favor osseus met disease   04/28/2016 - 05/12/2016 Radiation Therapy   Palliative XRT Left femur   05/24/2016 Procedure   Soft tissue mass biopsy back: Metastatic breast cancer ER 80%, PR 0%, Ki-67 50%, HER-2 positive ratio 2.25   06/09/2016 - 03/29/2017 Anti-estrogen oral therapy   Faslodex with Herceptin and Perjeta every 4 weeks   06/13/2016 - 06/15/2016 Hospital Admission   uncomplicated revision of previous thoracic instrumentation.   10/10/2016 PET scan   Interval development of new hypermetabolic 2.5 x 1.1 cm lesion posterior right eighth rib consistent with metastatic disease, stable right paraspinal lesion at T8, clustered soft tissue nodules in the posterior midline back near cervicothoracic junction smaller than before    11/03/2016 - 11/07/2016 Radiation Therapy   Palliative radiation to right eighth rib   11/04/2016 Imaging   Solitary contrast-enhancing lesion in the right cerebellum 6 x 2 mm concerning for metastatic lesion   11/24/2016 - 11/24/2016 Radiation Therapy   SRS to the brain lesion   04/18/2017 PET scan   New ill-defined rounded airspace opacity in posterior right lower  lobe. Differential diagnosis includes malignancy and infectious/inflammatory process.Increased hypermetabolic lytic lesion in T8 vertebral body, consistent with bone metastasis.  Increased hypermetabolic posterior upper chest wall subcutaneous nodules, highly suspicious metastatic disease. Further  improvement in right posterior eighth rib metastasis.   05/04/2017 - 07/27/2017 Chemotherapy   Kadcyla every 3 weeks palliative chemotherapy stopped for progression   07/26/2017 Imaging   Interval increase in size of the multiple enhancing masses posteriorly overlying the spine, interval development of left axillary and mediastinal adenopathy, increase in the size of lytic lesions in T8-T9 and left ninth rib, increased groundglass opacities right lower lobe lung   07/27/2017 - 05/11/2018 Chemotherapy   Palbociclib + Faslodex + trastuzumab + Pertuzumab     04/03/2018 PET scan   PET CT scan showed progression of disease not only in the bones but also in the subcutaneous nodules.   05/15/2018 - 05/25/2018 Radiation Therapy   Palliative radiation to the back   06/01/2018 -  Chemotherapy   Enhertu (Transtuzumab Deruxtecan)    08/07/2018 - 08/07/2018 Radiation Therapy   T12 vertebral body SRS   Carcinoma of right breast, stage 4 ([facility])  02/14/2015 Initial Diagnosis   Breast cancer metastasized to bone, right ([place])   06/01/2018 -  Chemotherapy   The patient had palonosetron (ALOXI) injection 0.25 mg, 0.25 mg, Intravenous,  Once, 17 of 30 cycles Administration: 0.25 mg (06/01/2018), 0.25 mg (06/22/2018), 0.25 mg (08/24/2018), 0.25 mg (07/13/2018), 0.25 mg (08/03/2018), 0.25 mg (09/14/2018), 0.25 mg (10/05/2018), 0.25 mg (11/16/2018), 0.25 mg (10/26/2018), 0.25 mg (12/07/2018), 0.25 mg (12/28/2018), 0.25 mg (01/18/2019), 0.25 mg (03/15/2019), 0.25 mg (02/15/2019), 0.25 mg (04/12/2019), 0.25 mg (05/09/2019), 0.25 mg (06/07/2019) fam-trastuzumab deruxtecan-nxki (ENHERTU) 332 mg in dextrose 5 % 100 mL chemo infusion, 5.4 mg/kg = 332 mg, Intravenous,  Once, 17 of 30 cycles Dose modification: 4.4 mg/kg (original dose 5.4 mg/kg, Cycle 7, Reason: Provider Judgment), 3.2 mg/kg (original dose 5.4 mg/kg, Cycle 9, Reason: Dose not tolerated) Administration: 332 mg (06/01/2018), 332 mg (06/22/2018), 332 mg (07/13/2018), 332 mg  (08/24/2018), 332 mg (08/03/2018), 332 mg (09/14/2018), 272 mg (10/05/2018), 200 mg (11/16/2018), 272 mg (10/26/2018), 200 mg (12/07/2018), 200 mg (12/28/2018), 200 mg (01/18/2019), 200 mg (03/15/2019), 200 mg (02/15/2019), 200 mg (04/12/2019), 200 mg (05/09/2019), 200 mg (06/07/2019)  for chemotherapy treatment.      CHIEF COMPLIANT: Follow-up of metastatic breast cancer onEnhertu  INTERVAL HISTORY: Heather [name] is a 74 y.o. with above-mentioned history of metastatic breast cancercurrently onchemotherapy with trastuzumab-Deruxtecan (Enhertu)every 4 weeks.She presents to the clinic todayfor treatment.  She recently completed stereotactic radiation to the brain for slight progression of brain metastases.  ALLERGIES:  is allergic to other; acyclovir and related; chlorhexidine; zanaflex [tizanidine hcl]; codeine; keflex [cephalexin]; naprosyn [naproxen]; oxycodone; tramadol; and tussin [guaifenesin].  MEDICATIONS:  Current Outpatient Medications  Medication Sig Dispense Refill  . ALPRAZolam (XANAX) 0.25 MG tablet Take 1 tablet by mouth as needed.    . Calcium Carbonate Antacid (TUMS PO) Take 2 tablets by mouth 2 (two) times daily as needed (acid reflux).    . Cholecalciferol (VITAMIN D) 2000 UNITS tablet Take 2,000 Units by mouth daily.    . cycloSPORINE (RESTASIS) 0.05 % ophthalmic emulsion 1 drop 2 (two) times daily.    . fluticasone (FLONASE) 50 MCG/ACT nasal spray     . gabapentin (NEURONTIN) 600 MG tablet Take 1 tablet (600 mg total) by mouth as directed. Take 1 tablet in the AM and afternoon and 1 1/2 tabs at bedtime 315 tablet 2  .  HYDROcodone-acetaminophen (NORCO/VICODIN) 5-325 MG tablet Take 1 tablet by mouth every 6 (six) hours as needed for moderate pain. 30 tablet 0  . lidocaine-prilocaine (EMLA) cream Apply to affected area once 30 g 3  . omeprazole (PRILOSEC) 40 MG capsule TAKE 1 CAPSULE BY MOUTH ONCE DAILY FOR 30 DAYS  5  . ondansetron (ZOFRAN) 8 MG tablet Take 1 tablet (8 mg total) by  mouth every 8 (eight) hours as needed for nausea or vomiting. 20 tablet 0  . OVER THE COUNTER MEDICATION Place 1 drop into both eyes 2 (two) times daily as needed (dry eyes). Over the counter lubricating eye drops    . polyethylene glycol (MIRALAX / GLYCOLAX) packet Take 17 g by mouth daily as needed.    . Probiotic Product (PROBIOTIC DAILY PO) Take 1 capsule by mouth daily.     . prochlorperazine (COMPAZINE) 10 MG tablet Take 1 tablet (10 mg total) by mouth every 6 (six) hours as needed for nausea or vomiting. 60 tablet 3  . spironolactone (ALDACTONE) 25 MG tablet Take 25 mg by mouth daily.     No current facility-administered medications for this visit.   Facility-Administered Medications Ordered in Other Visits  Medication Dose Route Frequency Provider Last Rate Last Admin  . sodium chloride flush (NS) 0.9 % injection 10 mL  10 mL Intracatheter PRN [name], MD   10 mL at 06/07/19 1124    PHYSICAL EXAMINATION: ECOG PERFORMANCE STATUS: 1 - Symptomatic but completely ambulatory  Vitals:   06/07/19 0838  BP: (!) 142/66  Pulse: 73  Resp: 17  Temp: 98 F (36.7 C)  SpO2: 100%   Filed Weights   06/07/19 0838  Weight: 123 lb 14.4 oz (56.2 kg)    LABORATORY DATA:  I have reviewed the data as listed CMP Latest Ref Rng & Units 06/07/2019 05/09/2019 04/12/2019  Glucose 70 - 99 mg/dL 176(H) 102(H) 98  BUN 8 - 23 mg/dL _0 Creatinine 0.44 - 1.00 mg/dL 0.82 0.73 0.76  Sodium 135 - 145 mmol/L 140 139 141  Potassium 3.5 - 5.1 mmol/L 3.8 3.7 4.0  Chloride 98 - 111 mmol/L 105 104 105  CO2 22 - 32 mmol/L _1 Calcium 8.9 - 10.3 mg/dL 9.6 9.4 10.3  Total Protein 6.5 - 8.1 g/dL 6.9 7.0 7.1  Total Bilirubin 0.3 - 1.2 mg/dL 0.7 0.6 0.8  Alkaline Phos 38 - 126 U/L 146(H) 130(H) 125  AST 15 - 41 U/L 31 38 37  ALT 0 - 44 U/L _2 Lab Results  Component Value Date   WBC 2.6 (L) 06/07/2019   HGB 12.2 06/07/2019   HCT 37.0 06/07/2019   MCV 92.5 06/07/2019   PLT 68 (L)  06/07/2019   NEUTROABS 2.0 06/07/2019    ASSESSMENT & PLAN:  Primary cancer of upper outer quadrant of right female breast ([place]) Right breast invasive ductal carcinoma ER positive PR negative HER-2 positive Ki-67 44% multifocal disease 3/7 lymph nodes positive T2 N1 M0 stage IIB status post adjuvant chemotherapy with TCH followed by Herceptin maintenance, adjuvant radiation therapy and Letrozole. MRI 02/14/2015: T5 large destructive lesion with pathologic compression fracture and extensive epidural tumor, involvement of T4, excision metastatic carcinoma ER 60%, PR 0%, HER-2 positive ratio 2.71 average copy #7.45 03/27/2015: T4-6 decompression surgery for spinal cord compression PET-CT 12/10/17Rt para-spinal mass mildy hypermetabolic SUV 5.9, lytic cortical lesion 4.8 cm lesion left prox femur suv 8.8 favor osseus met disease Echo  04/12/2017 shows well preserved ejection fraction of 55-60%  Treatment summary: 1. Resumed anastrozole 05/21/2015, but it has caused significant hair loss so we switched her to exemestane 25 mg once daily 07/23/2015 2. Herceptin every 3 week started 04/10/2015 3. Bone metastases: Zometa every 3 months 4. Radiation therapy to the spine (Dr. [name]) 04/13/2015 to 05/19/2015 5. Radiation therapy to left femur 04/28/2016 to 05/12/2016  6. SRS to the brain lesion 11/24/2016 7.Kadcyla started 05/05/2017 discontinued 07/27/2017 8.Palbociclib +Faslodex +trastuzumab+ Pertuzumabstarted 08/10/2017, stopped 04/04/2018 9. Enhertu started 06/01/18 10 SRS to brain January 2021 -------------------------------------------------------------------------------------------------------------------------- XRT to T123/04/2019 PET CT scan 10/03/2018:stable, improvement in subcutaneous mets    Current treatment:Cycle17day 1Enhertu (Fam-trastuzumab-Durextecan) Echo7/2020shows EF of 55-60%. Completed every 6 months.   Toxicities: 1.  Fatigue: Stable 2.  Neuropathic  pain: On gabapentin. 3.Hair thinning 4.  Thrombocytopenia: Monitoring closely  Currently onevery 4-weekEnhertu Scans will be done February 2021 She will return in 4 weeks for labs, f/u     Orders Placed This Encounter  Procedures  . CT Abdomen Pelvis W Contrast    Standing Status:   Future    Standing Expiration Date:   06/06/2020    Order Specific Question:   ** REASON FOR EXAM (FREE TEXT)    Answer:   Met breast cancer    Order Specific Question:   If indicated for the ordered procedure, I authorize the administration of contrast media per Radiology protocol    Answer:   Yes    Order Specific Question:   Preferred imaging location?    Answer:   [facility]    Order Specific Question:   Is Oral Contrast requested for this exam?    Answer:   Yes, Per Radiology protocol    Order Specific Question:   Radiology Contrast Protocol - do NOT remove file path    Answer:   \\[email or web address]  . CT Chest W Contrast    Standing Status:   Future    Standing Expiration Date:   06/06/2020    Order Specific Question:   ** REASON FOR EXAM (FREE TEXT)    Answer:   Met breast cancer restaging    Order Specific Question:   If indicated for the ordered procedure, I authorize the administration of contrast media per Radiology protocol    Answer:   Yes    Order Specific Question:   Preferred imaging location?    Answer:   [facility]    Order Specific Question:   Radiology Contrast Protocol - do NOT remove file path    Answer:   \\[email or web address]   The patient has a good understanding of the overall plan. she agrees with it. she will call with any problems that may develop before the next visit here.  Total time spent: 30 mins including face to face time and time spent for planning, charting and coordination of care  [name], MD 06/07/2019  I, Cloyde Reams Dorshimer, am acting as scribe for Dr. [name].  I have  reviewed the above documentation for accuracy and completeness, and I agree with the above.

## 2019-06-07 ENCOUNTER — Other Ambulatory Visit: Payer: Self-pay

## 2019-06-07 ENCOUNTER — Inpatient Hospital Stay (HOSPITAL_BASED_OUTPATIENT_CLINIC_OR_DEPARTMENT_OTHER): Payer: Medicare Other | Admitting: Hematology and Oncology

## 2019-06-07 ENCOUNTER — Inpatient Hospital Stay: Payer: Medicare Other

## 2019-06-07 ENCOUNTER — Inpatient Hospital Stay: Payer: Medicare Other | Attending: Hematology and Oncology

## 2019-06-07 DIAGNOSIS — Z923 Personal history of irradiation: Secondary | ICD-10-CM | POA: Diagnosis not present

## 2019-06-07 DIAGNOSIS — Z170 Estrogen receptor positive status [ER+]: Secondary | ICD-10-CM | POA: Diagnosis not present

## 2019-06-07 DIAGNOSIS — Z9221 Personal history of antineoplastic chemotherapy: Secondary | ICD-10-CM | POA: Diagnosis not present

## 2019-06-07 DIAGNOSIS — Z79899 Other long term (current) drug therapy: Secondary | ICD-10-CM | POA: Diagnosis not present

## 2019-06-07 DIAGNOSIS — C7951 Secondary malignant neoplasm of bone: Secondary | ICD-10-CM

## 2019-06-07 DIAGNOSIS — R5383 Other fatigue: Secondary | ICD-10-CM | POA: Diagnosis not present

## 2019-06-07 DIAGNOSIS — C50411 Malignant neoplasm of upper-outer quadrant of right female breast: Secondary | ICD-10-CM | POA: Insufficient documentation

## 2019-06-07 DIAGNOSIS — G629 Polyneuropathy, unspecified: Secondary | ICD-10-CM | POA: Diagnosis not present

## 2019-06-07 DIAGNOSIS — C50911 Malignant neoplasm of unspecified site of right female breast: Secondary | ICD-10-CM

## 2019-06-07 DIAGNOSIS — Z5112 Encounter for antineoplastic immunotherapy: Secondary | ICD-10-CM | POA: Insufficient documentation

## 2019-06-07 DIAGNOSIS — G9529 Other cord compression: Secondary | ICD-10-CM

## 2019-06-07 DIAGNOSIS — Z95828 Presence of other vascular implants and grafts: Secondary | ICD-10-CM

## 2019-06-07 LAB — CMP (CANCER CENTER ONLY)
ALT: 23 U/L (ref 0–44)
AST: 31 U/L (ref 15–41)
Albumin: 3.5 g/dL (ref 3.5–5.0)
Alkaline Phosphatase: 146 U/L — ABNORMAL HIGH (ref 38–126)
Anion gap: 10 (ref 5–15)
BUN: 16 mg/dL (ref 8–23)
CO2: 25 mmol/L (ref 22–32)
Calcium: 9.6 mg/dL (ref 8.9–10.3)
Chloride: 105 mmol/L (ref 98–111)
Creatinine: 0.82 mg/dL (ref 0.44–1.00)
GFR, Est AFR Am: >60 mL/min (ref >60)
GFR, Estimated: >60 mL/min (ref >60)
Glucose, Bld: 176 mg/dL — ABNORMAL HIGH (ref 70–99)
Potassium: 3.8 mmol/L (ref 3.5–5.1)
Sodium: 140 mmol/L (ref 135–145)
Total Bilirubin: 0.7 mg/dL (ref 0.3–1.2)
Total Protein: 6.9 g/dL (ref 6.5–8.1)

## 2019-06-07 LAB — CBC WITH DIFFERENTIAL (CANCER CENTER ONLY)
Abs Immature Granulocytes: 0 10*3/uL (ref 0.00–0.07)
Basophils Absolute: 0 10*3/uL (ref 0.0–0.1)
Basophils Relative: 1 %
Eosinophils Absolute: 0.1 10*3/uL (ref 0.0–0.5)
Eosinophils Relative: 3 %
HCT: 37 % (ref 36.0–46.0)
Hemoglobin: 12.2 g/dL (ref 12.0–15.0)
Immature Granulocytes: 0 %
Lymphocytes Relative: 12 %
Lymphs Abs: 0.3 10*3/uL — ABNORMAL LOW (ref 0.7–4.0)
MCH: 30.5 pg (ref 26.0–34.0)
MCHC: 33 g/dL (ref 30.0–36.0)
MCV: 92.5 fL (ref 80.0–100.0)
Monocytes Absolute: 0.2 10*3/uL (ref 0.1–1.0)
Monocytes Relative: 8 %
Neutro Abs: 2 10*3/uL (ref 1.7–7.7)
Neutrophils Relative %: 76 %
Platelet Count: 68 10*3/uL — ABNORMAL LOW (ref 150–400)
RBC: 4 MIL/uL (ref 3.87–5.11)
RDW: 15.1 % (ref 11.5–15.5)
WBC Count: 2.6 10*3/uL — ABNORMAL LOW (ref 4.0–10.5)
nRBC: 0 % (ref 0.0–0.2)

## 2019-06-07 MED ORDER — ACETAMINOPHEN 325 MG PO TABS
650.0000 mg | ORAL_TABLET | Freq: Once | ORAL | Status: AC
  Administered 2019-06-07: 650 mg via ORAL

## 2019-06-07 MED ORDER — PALONOSETRON HCL INJECTION 0.25 MG/5ML
INTRAVENOUS | Status: AC
  Filled 2019-06-07: qty 5

## 2019-06-07 MED ORDER — CYANOCOBALAMIN 1000 MCG/ML IJ SOLN
1000.0000 ug | Freq: Once | INTRAMUSCULAR | Status: AC
  Administered 2019-06-07: 1000 ug via INTRAMUSCULAR

## 2019-06-07 MED ORDER — HEPARIN SOD (PORK) LOCK FLUSH 100 UNIT/ML IV SOLN
500.0000 [IU] | Freq: Once | INTRAVENOUS | Status: AC | PRN
  Administered 2019-06-07: 500 [IU]
  Filled 2019-06-07: qty 5

## 2019-06-07 MED ORDER — ACETAMINOPHEN 325 MG PO TABS
ORAL_TABLET | ORAL | Status: AC
  Filled 2019-06-07: qty 2

## 2019-06-07 MED ORDER — FAM-TRASTUZUMAB DERUXTECAN-NXKI CHEMO 100 MG IV SOLR
200.0000 mg | Freq: Once | INTRAVENOUS | Status: AC
  Administered 2019-06-07: 200 mg via INTRAVENOUS
  Filled 2019-06-07: qty 10

## 2019-06-07 MED ORDER — CYANOCOBALAMIN 1000 MCG/ML IJ SOLN
INTRAMUSCULAR | Status: AC
  Filled 2019-06-07: qty 1

## 2019-06-07 MED ORDER — SODIUM CHLORIDE 0.9% FLUSH
10.0000 mL | Freq: Once | INTRAVENOUS | Status: AC
  Administered 2019-06-07: 10 mL
  Filled 2019-06-07: qty 10

## 2019-06-07 MED ORDER — DEXTROSE 5 % IV SOLN
Freq: Once | INTRAVENOUS | Status: AC
  Filled 2019-06-07: qty 250

## 2019-06-07 MED ORDER — SODIUM CHLORIDE 0.9% FLUSH
10.0000 mL | INTRAVENOUS | Status: DC | PRN
  Administered 2019-06-07: 10 mL
  Filled 2019-06-07: qty 10

## 2019-06-07 MED ORDER — PALONOSETRON HCL INJECTION 0.25 MG/5ML
0.2500 mg | Freq: Once | INTRAVENOUS | Status: AC
  Administered 2019-06-07: 0.25 mg via INTRAVENOUS

## 2019-06-07 NOTE — Patient Instructions (Signed)
[place] Discharge Instructions for Patients Receiving Chemotherapy  Today you received the following chemotherapy agents: enhertu  To help prevent nausea and vomiting after your treatment, we encourage you to take your nausea medication as directed.   If you develop nausea and vomiting that is not controlled by your nausea medication, call the clinic.   BELOW ARE SYMPTOMS THAT SHOULD BE REPORTED IMMEDIATELY:  *FEVER GREATER THAN 100.5 F  *CHILLS WITH OR WITHOUT FEVER  NAUSEA AND VOMITING THAT IS NOT CONTROLLED WITH YOUR NAUSEA MEDICATION  *UNUSUAL SHORTNESS OF BREATH  *UNUSUAL BRUISING OR BLEEDING  TENDERNESS IN MOUTH AND THROAT WITH OR WITHOUT PRESENCE OF ULCERS  *URINARY PROBLEMS  *BOWEL PROBLEMS  UNUSUAL RASH Items with * indicate a potential emergency and should be followed up as soon as possible.  Feel free to call the clinic should you have any questions or concerns. The clinic phone number is [phone].  Please show the [place] at check-in to the Emergency Department and triage nurse.

## 2019-06-07 NOTE — Progress Notes (Signed)
Pt's platelets today are 7 - ok to treat per [name].

## 2019-06-07 NOTE — Assessment & Plan Note (Signed)
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 MRI 02/14/2015: T5 large destructive lesion with pathologic compression fracture and extensive epidural tumor, involvement of T4, excision metastatic carcinoma ER 60%, PR 0%, HER-2 positive ratio 2.71 average copy #7.45 03/27/2015: T4-6 decompression surgery for spinal cord compression PET-CT 12/10/17Rt para-spinal mass mildy hypermetabolic SUV 5.9, lytic cortical lesion 4.8 cm lesion left prox femur suv 8.8 favor osseus met disease Echo 04/12/2017 shows well preserved ejection fraction of 55-60%  Treatment summary: 1. Resumed anastrozole 05/21/2015, but it has caused significant hair loss so we switched her to exemestane 25 mg once daily 07/23/2015 2. Herceptin every 3 week started 04/10/2015 3. Bone metastases: Zometa every 3 months 4. Radiation therapy to the spine (Dr. [name]) 04/13/2015 to 05/19/2015 5. Radiation therapy to left femur 04/28/2016 to 05/12/2016  6. SRS to the brain lesion 11/24/2016 7.Kadcyla started 05/05/2017 discontinued 07/27/2017 8.Palbociclib +Faslodex +trastuzumab+ Pertuzumabstarted 08/10/2017, stopped 04/04/2018 9. Enhertu started 06/01/18 -------------------------------------------------------------------------------------------------------------------------- XRT to T123/04/2019 PET CT scan 10/03/2018:stable, improvement in subcutaneous mets MRI brain9/02/2019: no intraparenchymal metastases, calvarial metastases stable  Current treatment:Cycle17day 1Enhertu (Fam-trastuzumab-Durextecan) Echo7/2020shows EF of 55-60%. Completed every 6 months.   Toxicities: 1.  Fatigue: Stable 2.  Neuropathic pain: On gabapentin. 3.Hair thinning 4.  Thrombocytopenia: Monitoring  closely  Currently onevery 4-weekEnhertu Scans will be done February 2021 She will return in 4 weeks for labs, f/u

## 2019-06-07 NOTE — Patient Instructions (Signed)

## 2019-06-27 ENCOUNTER — Ambulatory Visit (HOSPITAL_COMMUNITY): Payer: Medicare Other

## 2019-07-02 ENCOUNTER — Other Ambulatory Visit: Payer: Self-pay

## 2019-07-02 ENCOUNTER — Ambulatory Visit (HOSPITAL_COMMUNITY)
Admission: RE | Admit: 2019-07-02 | Discharge: 2019-07-02 | Disposition: A | Discharge status: Home / Self Care | Payer: Medicare Other | Source: Ambulatory Visit | Attending: Hematology and Oncology | Admitting: Hematology and Oncology

## 2019-07-02 DIAGNOSIS — C50411 Malignant neoplasm of upper-outer quadrant of right female breast: Secondary | ICD-10-CM

## 2019-07-02 DIAGNOSIS — C7951 Secondary malignant neoplasm of bone: Secondary | ICD-10-CM | POA: Diagnosis not present

## 2019-07-02 DIAGNOSIS — C50911 Malignant neoplasm of unspecified site of right female breast: Secondary | ICD-10-CM | POA: Diagnosis not present

## 2019-07-02 MED ORDER — IOHEXOL 300 MG/ML  SOLN
100.0000 mL | Freq: Once | INTRAMUSCULAR | Status: AC | PRN
  Administered 2019-07-02: 16:00:00 100 mL via INTRAVENOUS

## 2019-07-02 MED ORDER — SODIUM CHLORIDE (PF) 0.9 % IJ SOLN
INTRAMUSCULAR | Status: AC
  Filled 2019-07-02: qty 50

## 2019-07-04 ENCOUNTER — Ambulatory Visit
Admission: RE | Admit: 2019-07-04 | Discharge: 2019-07-04 | Disposition: A | Discharge status: Home / Self Care | Payer: Medicare Other | Source: Ambulatory Visit | Attending: Urology | Admitting: Urology

## 2019-07-04 ENCOUNTER — Encounter: Payer: Self-pay | Admitting: Urology

## 2019-07-04 ENCOUNTER — Other Ambulatory Visit: Payer: Self-pay

## 2019-07-04 DIAGNOSIS — C7931 Secondary malignant neoplasm of brain: Secondary | ICD-10-CM

## 2019-07-04 NOTE — Progress Notes (Signed)
Radiation Oncology         [phone] ________________________________  Name: Heather [name] MRN: [id number]  Date: 07/04/2019  DOB: 11/12/1945  Follow up Visit Note- Conducted via telephone due to current COVID-19 concerns for limiting patient exposure  CC: Heather Stains, MD  [name], MD  Diagnosis:   74 y.o. woman with ER positive, PR negative, HER-2 positive metastatic breast cancer with multifocal metastases involving the spine, bony skeleton, lung, and brain- most recently with an enlarging central cerebellar metastasis.   Interval Since Last Radiation: 1 month 05/31/19: SRS brain/ The 66m central cerebellar target was treated to a prescription dose of 20 Gy in a single fraction.  08/07/18:  SRS Spine/ The targeted metastases in the T12 and L2 vertebral bodies were treated to a prescription dose of 18 Gy in 1 fraction.   05/14/2018 - 05/25/2018:  Chest Wall, Right Upper Posterior subcutaneous lesions / treated to 30 Gy delivered in 10 fractions of 3 Gy  05/18/2017 - 05/31/2017:  The thoracic spine (T7-T9) was treated to 30 Gy in 10 fractions of 3 Gy.  6/27-7/18/18 SRS and palliative treatment: 1.  The rib 8th was treated to 30 Gy in 10 fractions 2.  Right cerebellar 6 mm target was treated using 3 Dynamic Conformal Arcs to a prescription dose of 20 Gy in one fraction  04/27/16-05/12/16:  Left femur/ 30 Gy in 10 fractions   04/13/2015-05/19/2015 Postop:  55 Gy in 25 fractions of 2.2 Gy to the thoracic spine   10/25/12-12/11/12: 50.4 Gy to the right chest wall and regional lymph nodes in 28 fractions with a 10 Gy boost to the right chest wall in 1 session.  Narrative:  In summary, [name] a well known patient to our service with a history of metastatic breast cancer originally treated in 2014 to the chest wall and regional lymph nodes. She developed recurrent disease within the thoracic spine and subsequently has undergone 3 surgeries, in addition to radiotherapy, with her  most recent surgery being on 06/13/2016 where she had removal of thoracic hardware and replacement of pedicle screws with stabilization.  Of note previously placed screws between T3, T4 T6 and T7 were identified and dissection was carried out inferiorly to T8, T9-T10 and T11, T8 through T11 pedicle screw trajectories were placed bilaterally. She recovered well since treatment to her spine as she had been paralyzed in 2016, and with rigorous therapy has been able to make an impressive recovery. She continues on systemic therapy under the care of Dr. GLindi Adieand is also followed along in the brain and spine oncology conference. PET scan on 10/10/16 revealed a new hypermetabolic 2.5 x 1.1 cm lesion in the posterior right 8th rib. She also mentioned having new onset headaches, dizziness, and an episode of visual change,so an MRI brain was performed 11/04/16 demonstrating a solitary contrast-enhancing lesion within the right cerebellum, measuring 6 x 2 mm and lying just beneath the right tentorial leaflet and concerning for a metastatic lesion.  This solitary lesion was confirmed with SRS protocol MRI brain on 11/16/16 with no new lesions noted. This lesion was treated with SRS on 11/23/16 which she tolerated very well. She also completed palliative radiotherapy to the right eighth rib from 11/02/16 - 11/23/16 with significant improvement in her pain. She remained on Herceptin, Perjeta, Faslodex and Zometa for systemic disease under the care and direction of Dr. GLindi Adie  She developed increased pain in the T8 region and also had PET positive findings  in this region, as well as hypermetabolic right lower lobe mass concerning for progressive metastatic disease on follow up imaging 04/18/2017, despite systemic treatment. Her systemic therapy was switched to Kadcyla on 05/05/17 with discontinuation of Faslodex, Herceptin, and Perjeta.  She proceeded with palliative radiotherapy to 30 Gy in 10 fractions to the thoracic spine  (T7-T9) from 05/18/2017 - 05/31/2017.  This treatment was tolerated very well.  A repeat brain MRI performed 06/15/17 showed no new or progressive contrast enhancing lesions and no residual abnormality at the site of the previous treated right cerebellar lesion but Chest CT on 07/26/17 showed clear disease progression with interval increase in size of the multiple enhancing masses posteriorly overlying the spine, interval development of left axillary and mediastinal adenopathy, increase in the size of lytic lesions in T8-T9 and left ninth rib, as well as increased groundglass opacities right lower lobe lung. Therefore, she was switched to Palbociclib + Faslodex + trastuzumab + Pertuzumab, started 08/10/17.   MRI T-spine on 09/21/17 showed the known T12 lesion had slightly progressed by a few millimeters from 03/15/17 and there was a new lesion at L2 which was not present on lumbar MRI from 07/13/16. Follow up systemic imaging on 12/13/17, showed a good response to treatment with improved LAN and decreased subcutaneous nodules in the upper thoracic region.  Follow up brain MRI imaging has continued to show interval increase in size the right parietal calvarium metastasis with stable appearance in size of a right frontal calvarial metastasis and no parenchymal or dural metastases. PET scan from 04/03/2018 unfortunately showed significant disease progression despite systemic therapy.  Her systemic chemotherapy was discontinued on 04/04/2018 due to disease progression and she was switched to [facility] (Fam-trastuzumab-Durextecan) q 3 weeks beginning 06/01/18.  Repeat MRI imaging of the brain and thoracic spine was performed on 07/12/2018.  Brain imaging demonstrated 3 skull metastases ranging from 5 to 14 mm in diameter which appeared stable to slightly increased since December 2019 but no new metastases or brain parenchymal or dural metastases identified.  On thoracic spine imaging there was enlargement of the left paracentral T12  vertebral body metastasis increased from 14 mm in May 2019 to 20 mm currently without associated pathologic fracture and otherwise stable appearance of previously treated lesions in the thoracic spine with no new disease noted.  The posterior subcutaneous nodular metastases appear regressed.  Her imaging studies were reviewed and discussed at the multidisciplinary brain and spine conference on 07/16/2018 and the consensus recommendation was to proceed with stereotactic radiosurgery [facility]) to the progressive disease at T12 which was completed on 08/07/18.  At the time of her CT [facility] for treatment planning, it was also noted that she had an enlarging lesion in the vertebral body of L2 as compared to her PET scan from 03/28/2018 so this lesion was included in the planning and was treated with a single fraction SRS at the time of T12 treatment as well. She tolerated the treatment well and did not experience any ill side effects.  Follow up systemic imaging with PET 10/03/18 showed a good response to treatment with less activity in the recently treated lesions at L2 and T12, no new bony lesions, as well as resolution of subcutaneous nodules in the upper back and pubic area and resolution of right adrenal hypermetabolic mass.  Her brain MRI from 10/13/18 was also stable with no visible intraparenchymal metastases to the brain and an unchanged appearance of 3 calvarial metastases.  She has longstanding weakness in bilateral LEs,  unchanged recently and she associates this with her inactivity due to pronounced fatigue following systemic treatments with ENHERTU.  She reports that it begins to improve approximately 2 weeks after her treatments when she is able to become more mobile but resumes following each treatment every 3 weeks.  She denies any new or increased paraesthesias in the lower extremities but reports neuropathy in bilateral UEs as well as "nerve pain" in the right axilla that travels down her right side/torso.  She  also reports chronic right>left jaw pain and reports that her dentist has told her previously that she had some loss of bone in the right jaw based on xray evaluation.  She is taking Zometa q 3 months and will discuss this with [name].   A follow up MRI brain scan from 05/17/2019 showed a slowly enlarging metastasis at the superior cerebellar vermis measuring 4 mm but without mass-effect or edema.  3 additional known calvarial metastases did not show any growth or progression.  After discussion of these findings, the patient elected to proceed with Jackpot treatment to the enlarging lesion and this was completed in a single fraction on 05/31/2019.  INTERVAL HISTORY:   I spoke with the patient to conduct her routine scheduled 1 month follow up visit via telephone to spare the patient unnecessary potential exposure in the healthcare setting during the current COVID-19 pandemic.  The patient was notified in advance and gave permission to proceed with this visit format.   She reports that she is doing well overall and has not experienced any ill side effects associated with her recent SRS brain treatment.  She denies headaches, dizziness, N/V or visual changes.   She has continued her systemic therapy with ENHERTU under the care and direction of [name] having completed 17 cycles as of 06/07/19 and the current plan is to continue on this regimen given her stability of systemic disease noted on recent CT C/A/P from 07/02/19.   On review of systems, the patient reports that she continues doing well overall.  She continues with moderate fatigue which is tolerable and not significantly interfering with her daily activities aside from the first 2 weeks after systemic treatments.  She has tolerated her systemic therapy much better since her dose was reduced on 10/05/18.  She has persistent low grade nausea but no vomiting and is able to eat.  She remains as active as she can be.  She has noted continued improvement in the  size of the subcutaneous nodules and in the burning/tingling pain that radiated from the left aspect of the spine underneath her breast since completing radiotherapy. She has not had recent changes in visual or auditory acuity, tinnitus, increased imbalance, tremor or seizure activity. She denies recent headaches, sternal chest pain, shortness of breath, cough, fevers, chills, night sweats, or recent unintended weight changes. She denies any bowel or bladder disturbances, and denies abdominal pain or vomiting currently. She denies any new musculoskeletal or joint aches or pains, new skin lesions or concerns aside from those mentioned above in the HPI.  A complete review of systems is obtained and is otherwise negative.  Past Medical History:  Past Medical History:  Diagnosis Date  . Anemia    hx of  . Anxiety    r/t updated surgery  . Breast cancer ([place])    right  . Cataract    immature-not sure of which eye  . Complication of anesthesia    pt states she is sensitive to meds  .  Dizziness    has been going on for 3month;medical MD aware  . Genetic testing 07/11/2016   Test Results: Normal; No pathogenic mutations detected  Genes Analyzed: 43 genes on [employer] Common Cancers panel (APC, ATM, AXIN2, BARD1, BMPR1A, BRCA1, BRCA2, BRIP1, CDH1, CDKN2A, CHEK2, DICER1, EPCAM, GREM1, HOXB13, KIT, MEN1, MLH1, MSH2, MSH6, MUTYH, NBN, NF1, PALB2, PDGFRA, PMS2, POLD1, POLE, PTEN, RAD50, RAD51C, RAD51D, SDHA, SDHB, SDHC, SDHD, SMAD4, SMARCA4, STK11, TP53, TSC1, TSC2, VHL).  .Marland KitchenGERD (gastroesophageal reflux disease)    Tums prn  . H. pylori infection   . H/O hiatal hernia   . Hemorrhoids   . History of UTI   . Hx of radiation therapy 10/25/12- 12/11/12   right chest wall/regional lymph nodes 5040 cGy, 28 sessions, right chest wall boost 1000 cGy 1 session  . Hx: UTI (urinary tract infection)   . Hyperlipidemia    but not on meds;diet and exercise controlled  . Hypertension    recently started Aldactone     . Insomnia    takes Melatoniin daily  . Metastasis to spinal column (HCC)    T5, Left  Femur cancer- radiation   . Neuropathy    "from back surgery"  . PONV (postoperative nausea and vomiting)    pt experienced hair loss, confusion and combative- 02/15/15  . Right knee pain   . Right shoulder pain   . Skin cancer    squamous, Nodule to back - "same cancer as breast."  . Urinary frequency    d/t taking Aldactone    Past Surgical History: Past Surgical History:  Procedure Laterality Date  . COLONOSCOPY    . cyst removed from left breast  1970  . DECOMPRESSIVE LUMBAR LAMINECTOMY LEVEL 4 N/A 02/15/2015   Procedure: DECOMPRESSION T5 AND T3-T7 STABALIZATION;  Surgeon: NConsuella Lose MD;  [facility];  Service: Neurosurgery;  Laterality: N/A;  . ESOPHAGOGASTRODUODENOSCOPY    . LAMINECTOMY N/A 03/27/2015   Procedure: Thoracic Four-Thoracic Six Laminectomy for tumor;  Surgeon: NConsuella Lose MD;  [facility];  Service: Neurosurgery;  Laterality: N/A;  T4-T6 Laminectomy  . left wrist surgery   2004   with plate  . MASTECTOMY MODIFIED RADICAL  05/10/2012   Procedure: MASTECTOMY MODIFIED RADICAL;  Surgeon: PMerrie Roof MD;  [place] [facility]  Service: General;  Laterality: Right;  RIGHT MODIFIED RADICAL MASTECTOMY  . MASTECTOMY, RADICAL Right   . MINOR BREAST BIOPSY Left 05/16/2016   Procedure: EXCISION OF BACK NODULE;  Surgeon: PAutumn MessingIII, MD;  [facility]  Service: General;  Laterality: Left;  . Nodule  back  05/17/2015  . PORTACATH PLACEMENT  05/10/2012   Procedure: INSERTION PORT-A-CATH;  Surgeon: PMerrie Roof MD;  [facility];  Service: General;  Laterality: Left;    Social History:  Social History   Socioeconomic History  . Marital status: Married    Spouse name: Not on file  . Number of children: 2  . Years of education: Not on file  . Highest education level: Not on file  Occupational History  . Not on file   Tobacco Use  . Smoking status: Never Smoker  . Smokeless tobacco: Never Used  Substance and Sexual Activity  . Alcohol use: No  . Drug use: No  . Sexual activity: Yes    Birth control/protection: Post-menopausal  Other Topics Concern  . Not on file  Social History Narrative  . Not on file   Social Determinants  of Health   Financial Resource Strain:   . Difficulty of Paying Living Expenses: Not on file  Food Insecurity:   . Worried About Charity fundraiser in the Last Year: Not on file  . Ran Out of Food in the Last Year: Not on file  Transportation Needs:   . Lack of Transportation (Medical): Not on file  . Lack of Transportation (Non-Medical): Not on file  Physical Activity:   . Days of Exercise per Week: Not on file  . Minutes of Exercise per Session: Not on file  Stress:   . Feeling of Stress : Not on file  Social Connections:   . Frequency of Communication with Friends and Family: Not on file  . Frequency of Social Gatherings with Friends and Family: Not on file  . Attends Religious Services: Not on file  . Active Member of Clubs or Organizations: Not on file  . Attends Archivist Meetings: Not on file  . Marital Status: Not on file  Intimate Partner Violence:   . Fear of Current or Ex-Partner: Not on file  . Emotionally Abused: Not on file  . Physically Abused: Not on file  . Sexually Abused: Not on file    Family History: Family History  Problem Relation Age of Onset  . Leukemia Mother 104       deceased 20  . Stroke Father   . Diabetes Mellitus II Father   . Prostate cancer Brother 65       currently 53  . Stomach cancer Maternal Grandmother 12       deceased 79  . Prostate cancer Brother 4       currently 47     ALLERGIES:  is allergic to other; acyclovir and related; chlorhexidine; zanaflex [tizanidine hcl]; codeine; keflex [cephalexin]; naprosyn [naproxen]; oxycodone; tramadol; and tussin [guaifenesin].  Meds: Current Outpatient  Medications  Medication Sig Dispense Refill  . ALPRAZolam (XANAX) 0.25 MG tablet Take 1 tablet by mouth as needed.    . Calcium Carbonate Antacid (TUMS PO) Take 2 tablets by mouth 2 (two) times daily as needed (acid reflux).    . Cholecalciferol (VITAMIN D) 2000 UNITS tablet Take 2,000 Units by mouth daily.    . cycloSPORINE (RESTASIS) 0.05 % ophthalmic emulsion 1 drop 2 (two) times daily.    . fluticasone (FLONASE) 50 MCG/ACT nasal spray     . gabapentin (NEURONTIN) 600 MG tablet Take 1 tablet (600 mg total) by mouth as directed. Take 1 tablet in the AM and afternoon and 1 1/2 tabs at bedtime 315 tablet 2  . HYDROcodone-acetaminophen (NORCO/VICODIN) 5-325 MG tablet Take 1 tablet by mouth every 6 (six) hours as needed for moderate pain. 30 tablet 0  . lidocaine-prilocaine (EMLA) cream Apply to affected area once 30 g 3  . omeprazole (PRILOSEC) 40 MG capsule TAKE 1 CAPSULE BY MOUTH ONCE DAILY FOR 30 DAYS  5  . ondansetron (ZOFRAN) 8 MG tablet Take 1 tablet (8 mg total) by mouth every 8 (eight) hours as needed for nausea or vomiting. 20 tablet 0  . OVER THE COUNTER MEDICATION Place 1 drop into both eyes 2 (two) times daily as needed (dry eyes). Over the counter lubricating eye drops    . polyethylene glycol (MIRALAX / GLYCOLAX) packet Take 17 g by mouth daily as needed.    . polyethylene glycol powder (GLYCOLAX/MIRALAX) 17 GM/SCOOP powder Take by mouth.    . Probiotic Product (PROBIOTIC DAILY PO) Take 1 capsule by mouth daily.     Marland Kitchen  prochlorperazine (COMPAZINE) 10 MG tablet Take 1 tablet (10 mg total) by mouth every 6 (six) hours as needed for nausea or vomiting. 60 tablet 3  . spironolactone (ALDACTONE) 25 MG tablet Take 25 mg by mouth daily.     No current facility-administered medications for this encounter.    Physical Findings:  Unable to assess due to telephone follow-up visit format.   Lab Findings: Lab Results  Component Value Date   WBC 2.6 (L) 06/07/2019   HGB 12.2 06/07/2019    HCT 37.0 06/07/2019   MCV 92.5 06/07/2019   PLT 68 (L) 06/07/2019     Radiographic Findings: CT Chest W Contrast  Result Date: 07/03/2019 CLINICAL DATA:  Follow-up right breast cancer with osseous metastases EXAM: CT CHEST, ABDOMEN, AND PELVIS WITH CONTRAST TECHNIQUE: Multidetector CT imaging of the chest, abdomen and pelvis was performed following the standard protocol during bolus administration of intravenous contrast. CONTRAST:  127m OMNIPAQUE IOHEXOL 300 MG/ML  SOLN COMPARISON:  12/27/2018 FINDINGS: CT CHEST FINDINGS Cardiovascular: Heart is normal in size.  No pericardial effusion. No evidence of thoracic aortic aneurysm. Mild atherosclerotic calcifications of the aortic arch. Right chest port terminates in the upper right atrium. Mediastinum/Nodes: No suspicious mediastinal, hilar, or axillary lymphadenopathy. Status post right axillary lymph node dissection. Visualized thyroid is unremarkable. Lungs/Pleura: Radiation changes in the lateral right upper lobe. Additional radiation changes in paramediastinal regions. Stable pleural-parenchymal scarring in the posterior right lower lobe with residual nodular scarring inferiorly (series 6/image 92), unchanged. Linear scarring/atelectasis in the left lower lobe. No focal consolidation. No suspicious pulmonary nodules. No pleural effusion or pneumothorax. Musculoskeletal: Status post right mastectomy. Stable severe compression fracture deformity at T5 with associated kyphosis. T2-11 posterior spinal fusion. Stable sclerotic metastasis involving the T12 vertebral body (sagittal image 74). Stable sclerotic metastasis involving the right acromion (series 2/image 4). CT ABDOMEN PELVIS FINDINGS Hepatobiliary: Liver is within normal limits. Gallbladder is unremarkable. No intrahepatic or extrahepatic ductal dilatation. Pancreas: Within normal limits. Spleen: Within normal limits. Adrenals/Urinary Tract: Adrenal glands are within normal limits. Kidneys are within  normal limits.  No hydronephrosis. Mildly thick-walled bladder, although underdistended. Stomach/Bowel: Stomach is notable for a small hiatal hernia. No evidence of bowel obstruction. Normal appendix (series 2/image 95). Mild to moderate left colonic stool burden. Vascular/Lymphatic: No evidence of abdominal aortic aneurysm. No suspicious abdominopelvic lymphadenopathy. Reproductive: Uterus is within normal limits. Bilateral ovaries are within normal limits. Other: No abdominopelvic ascites. Musculoskeletal: Stable sclerotic metastasis involving the L2 vertebral body (sagittal image 66). Otherwise, there are no focal osseous lesions. IMPRESSION: Status post right mastectomy with right axillary lymph node dissection. Radiation changes in the lungs bilaterally, as above. Stable osseous metastases involving the right acromion, T12, and L2. Stable severe compression fracture deformity at T5 with associated kyphosis. T2-11 posterior spinal fixation hardware. No evidence of new/progressive metastatic disease. Electronically Signed   By: SJulian HyM.D.   On: 07/03/2019 10:10   CT Abdomen Pelvis W Contrast  Result Date: 07/03/2019 CLINICAL DATA:  Follow-up right breast cancer with osseous metastases EXAM: CT CHEST, ABDOMEN, AND PELVIS WITH CONTRAST TECHNIQUE: Multidetector CT imaging of the chest, abdomen and pelvis was performed following the standard protocol during bolus administration of intravenous contrast. CONTRAST:  1050mOMNIPAQUE IOHEXOL 300 MG/ML  SOLN COMPARISON:  12/27/2018 FINDINGS: CT CHEST FINDINGS Cardiovascular: Heart is normal in size.  No pericardial effusion. No evidence of thoracic aortic aneurysm. Mild atherosclerotic calcifications of the aortic arch. Right chest port terminates in the upper right atrium.  Mediastinum/Nodes: No suspicious mediastinal, hilar, or axillary lymphadenopathy. Status post right axillary lymph node dissection. Visualized thyroid is unremarkable. Lungs/Pleura:  Radiation changes in the lateral right upper lobe. Additional radiation changes in paramediastinal regions. Stable pleural-parenchymal scarring in the posterior right lower lobe with residual nodular scarring inferiorly (series 6/image 92), unchanged. Linear scarring/atelectasis in the left lower lobe. No focal consolidation. No suspicious pulmonary nodules. No pleural effusion or pneumothorax. Musculoskeletal: Status post right mastectomy. Stable severe compression fracture deformity at T5 with associated kyphosis. T2-11 posterior spinal fusion. Stable sclerotic metastasis involving the T12 vertebral body (sagittal image 74). Stable sclerotic metastasis involving the right acromion (series 2/image 4). CT ABDOMEN PELVIS FINDINGS Hepatobiliary: Liver is within normal limits. Gallbladder is unremarkable. No intrahepatic or extrahepatic ductal dilatation. Pancreas: Within normal limits. Spleen: Within normal limits. Adrenals/Urinary Tract: Adrenal glands are within normal limits. Kidneys are within normal limits.  No hydronephrosis. Mildly thick-walled bladder, although underdistended. Stomach/Bowel: Stomach is notable for a small hiatal hernia. No evidence of bowel obstruction. Normal appendix (series 2/image 95). Mild to moderate left colonic stool burden. Vascular/Lymphatic: No evidence of abdominal aortic aneurysm. No suspicious abdominopelvic lymphadenopathy. Reproductive: Uterus is within normal limits. Bilateral ovaries are within normal limits. Other: No abdominopelvic ascites. Musculoskeletal: Stable sclerotic metastasis involving the L2 vertebral body (sagittal image 66). Otherwise, there are no focal osseous lesions. IMPRESSION: Status post right mastectomy with right axillary lymph node dissection. Radiation changes in the lungs bilaterally, as above. Stable osseous metastases involving the right acromion, T12, and L2. Stable severe compression fracture deformity at T5 with associated kyphosis. T2-11  posterior spinal fixation hardware. No evidence of new/progressive metastatic disease. Electronically Signed   By: Julian Hy M.D.   On: 07/03/2019 10:10    Impression/Plan:  This visit was conducted via telephone to spare the patient unnecessary potential exposure in the healthcare setting during the current COVID-19 pandemic. 71. 74 yo woman with ER positive breast cancer with metastatic disease to the brain, bones and spine.  She appears to have recovered well from the effects of her recent brain radiation and is currently without complaints.  We discussed the plan to obtain a restaging MRI brain in approximately 2 months to assess treatment response and pending this scan is stable, we will resume serial brain MRI scans every 3 months going forward.  Regarding her systemic disease, she will continue on her current chemotherapy regimen with routine follow-up for disease management under the care and direction of [name].   2. Spine metastases.  She has recovered well from the effects of her most recent [place] treatment to T12 and L2 and is currently without complaints.  She continues to prefer to have this followed with systemic imaging under the care and direction of [name] and only repeat spine MRI if she becomes symptomatic.  We have previously discussed that CT imaging is not the ideal imaging modailty for evaluation of the spine but certainly understand her concerns for limiting the number of imaging studies that she is subjected to on a routine basis.  She understands and accepts the associated risks of missing a very small spinal lesion on CT.  Her recent CT C/A/P imaging from 07/02/19 showed continued disease stability with no new or progressive interval findings. The previously treated metastases at T12 and L2 appear stable and there are no new osseous lesions identified. She is a most reliable patient and will let us know if she becomes symptomatic in the spine so that we can more  thoroughly evaluate with MRI if/when necessary.  She appears to have a good understanding of her overall disease and our recommendations and is in agreement with the stated plan.  She knows to call at anytime in the interim with any questions or concerns.  Given current concerns for patient exposure during the COVID-19 pandemic, this encounter was conducted via telephone. The patient was notified in advance and was offered a [place] meeting to allow for face to face communication but unfortunately reported that she did not have the appropriate resources/technology to support such a visit and instead preferred to proceed with telephone consult. The patient has given verbal consent for this type of encounter. The time spent during this encounter was 20 minutes with approximately 50% of that time spent in counseling and coordination of care. The attendants for this meeting include Sherylann Vangorden PA-C, and patient, Heather [name]. During the encounter, Filippo Puls PA-C was located at [facility] Radiation Oncology Department.  Patient, Heather [name] was located at home.   Nicholos Johns, PA-C

## 2019-07-04 NOTE — Progress Notes (Signed)
Patient Care Team: [name], MD as PCP - General (Family Medicine)  DIAGNOSIS:    ICD-10-CM   1. Brain metastasis ([place])  C79.31   2. Primary cancer of upper outer quadrant of right female breast ([place])  C50.411     SUMMARY OF ONCOLOGIC HISTORY: Oncology History  Primary cancer of upper outer quadrant of right female breast ([place])  03/08/2012 Initial Diagnosis   Right breast invasive ductal carcinoma ER positive PR negative HER-2 positive Ki-67 44%; another breast mass biopsied in the anterior part of the breast which was also positive for malignancy that was HER-2 negative   03/14/2012 Cancer Staging   Staging form: Breast, AJCC 7th Edition - Pathologic: Stage IV (M1) - Signed by [name], NP on 06/22/2018   05/10/2012 Surgery   Right mastectomy and axillary lymph node dissection: Multifocal disease 5 cm, 1.7 cm, 1.6 cm, ER positive PR negative HER-2 positive Ki-67 44%, 3/7 lymph nodes positive   06/14/2012 - 06/12/2013 Chemotherapy   Adjuvant chemotherapy with [place] 6 followed by Herceptin maintenance   10/25/2012 - 12/11/2012 Radiation Therapy   Adjuvant radiation therapy   02/07/2013 -  Anti-estrogen oral therapy   Letrozole 2.5 mg daily   02/14/2015 Imaging   MRI spine: Large destructive T5 lesion with severe pathologic compression fracture and extensive epidural tumor, severe spinal stenosis with moderate cord compression, tumor involvement of T4   03/18/2015 PET scan   Residual enhancing soft tissue adjacent to the spinal cord. No evidence of metastatic disease. Nonspecific uptake the left nipple   03/27/2015 - 03/30/2015 Hospital Admission   T4-6 decompression for spinal cord compression (lower extremity paralysis)   04/10/2015 -  Chemotherapy   Palliative treatment with Herceptin every 3 weeks with letrozole 2.5 mg daily   04/13/2015 - 05/19/2015 Radiation Therapy   Palliative radiation treatment to the spine   09/01/2015 Imaging   CT chest abdomen pelvis:  Pathologic fracture with posterior fusion at T5 no other evidence of metastatic disease in the chest abdomen pelvis   12/23/2015 Imaging   CT CAP: new nonspecific 0.6 cm lymph node was stated mediastinum needs follow-up CT, innumerable tiny groundglass pulmonary nodules throughout both lungs unchanged, patchy consolidation from radiation, T5 fracture, no mets   04/15/2016 PET scan   Rt para-spinal mass mildy hypermetabolic SUV 5.9, lytic cortical lesion 4.8 cm lesion left prox femur suv 8.8 favor osseus met disease   04/28/2016 - 05/12/2016 Radiation Therapy   Palliative XRT Left femur   05/24/2016 Procedure   Soft tissue mass biopsy back: Metastatic breast cancer ER 80%, PR 0%, Ki-67 50%, HER-2 positive ratio 2.25   06/09/2016 - 03/29/2017 Anti-estrogen oral therapy   Faslodex with Herceptin and Perjeta every 4 weeks   06/13/2016 - 06/15/2016 Hospital Admission   uncomplicated revision of previous thoracic instrumentation.   10/10/2016 PET scan   Interval development of new hypermetabolic 2.5 x 1.1 cm lesion posterior right eighth rib consistent with metastatic disease, stable right paraspinal lesion at T8, clustered soft tissue nodules in the posterior midline back near cervicothoracic junction smaller than before    11/03/2016 - 11/07/2016 Radiation Therapy   Palliative radiation to right eighth rib   11/04/2016 Imaging   Solitary contrast-enhancing lesion in the right cerebellum 6 x 2 mm concerning for metastatic lesion   11/24/2016 - 11/24/2016 Radiation Therapy   SRS to the brain lesion   04/18/2017 PET scan   New ill-defined rounded airspace opacity in posterior right lower lobe. Differential diagnosis  includes malignancy and infectious/inflammatory process.Increased hypermetabolic lytic lesion in T8 vertebral body, consistent with bone metastasis.  Increased hypermetabolic posterior upper chest wall subcutaneous nodules, highly suspicious metastatic disease. Further improvement in right  posterior eighth rib metastasis.   05/04/2017 - 07/27/2017 Chemotherapy   Kadcyla every 3 weeks palliative chemotherapy stopped for progression   07/26/2017 Imaging   Interval increase in size of the multiple enhancing masses posteriorly overlying the spine, interval development of left axillary and mediastinal adenopathy, increase in the size of lytic lesions in T8-T9 and left ninth rib, increased groundglass opacities right lower lobe lung   07/27/2017 - 05/11/2018 Chemotherapy   Palbociclib + Faslodex + trastuzumab + Pertuzumab     04/03/2018 PET scan   PET CT scan showed progression of disease not only in the bones but also in the subcutaneous nodules.   05/15/2018 - 05/25/2018 Radiation Therapy   Palliative radiation to the back   06/01/2018 -  Chemotherapy   Enhertu (Transtuzumab Deruxtecan)    08/07/2018 - 08/07/2018 Radiation Therapy   T12 vertebral body SRS   Carcinoma of right breast, stage 4 ([facility])  02/14/2015 Initial Diagnosis   Breast cancer metastasized to bone, right ([place])   06/01/2018 -  Chemotherapy   The patient had palonosetron (ALOXI) injection 0.25 mg, 0.25 mg, Intravenous,  Once, 17 of 30 cycles Administration: 0.25 mg (06/01/2018), 0.25 mg (06/22/2018), 0.25 mg (08/24/2018), 0.25 mg (07/13/2018), 0.25 mg (08/03/2018), 0.25 mg (09/14/2018), 0.25 mg (10/05/2018), 0.25 mg (11/16/2018), 0.25 mg (10/26/2018), 0.25 mg (12/07/2018), 0.25 mg (12/28/2018), 0.25 mg (01/18/2019), 0.25 mg (03/15/2019), 0.25 mg (02/15/2019), 0.25 mg (04/12/2019), 0.25 mg (05/09/2019), 0.25 mg (06/07/2019) fam-trastuzumab deruxtecan-nxki (ENHERTU) 332 mg in dextrose 5 % 100 mL chemo infusion, 5.4 mg/kg = 332 mg, Intravenous,  Once, 17 of 30 cycles Dose modification: 4.4 mg/kg (original dose 5.4 mg/kg, Cycle 7, Reason: Provider Judgment), 3.2 mg/kg (original dose 5.4 mg/kg, Cycle 9, Reason: Dose not tolerated), 150 mg (original dose 5.4 mg/kg, Cycle 18, Reason: Dose not tolerated) Administration: 332 mg (06/01/2018), 332  mg (06/22/2018), 332 mg (07/13/2018), 332 mg (08/24/2018), 332 mg (08/03/2018), 332 mg (09/14/2018), 272 mg (10/05/2018), 200 mg (11/16/2018), 272 mg (10/26/2018), 200 mg (12/07/2018), 200 mg (12/28/2018), 200 mg (01/18/2019), 200 mg (03/15/2019), 200 mg (02/15/2019), 200 mg (04/12/2019), 200 mg (05/09/2019), 200 mg (06/07/2019)  for chemotherapy treatment.      CHIEF COMPLIANT: Follow-up of metastatic breast cancer onEnhertu, review scans  INTERVAL HISTORY: Heather [name] is a 74 y.o. with above-mentioned history of metastatic breast cancercurrently onchemotherapy with trastuzumab-Deruxtecan (Enhertu)every 4 weeks.CT CAP on 07/02/19 showed stable osseous metastases and no evidence of new or progressive metastatic disease. She presents to the clinic todayfor treatment and to review her scans.  ALLERGIES:  is allergic to other; acyclovir and related; chlorhexidine; zanaflex [tizanidine hcl]; codeine; keflex [cephalexin]; naprosyn [naproxen]; oxycodone; tramadol; and tussin [guaifenesin].  MEDICATIONS:  Current Outpatient Medications  Medication Sig Dispense Refill  . ALPRAZolam (XANAX) 0.25 MG tablet Take 1 tablet by mouth as needed.    . Calcium Carbonate Antacid (TUMS PO) Take 2 tablets by mouth 2 (two) times daily as needed (acid reflux).    . Cholecalciferol (VITAMIN D) 2000 UNITS tablet Take 2,000 Units by mouth daily.    . cycloSPORINE (RESTASIS) 0.05 % ophthalmic emulsion 1 drop 2 (two) times daily.    . fluticasone (FLONASE) 50 MCG/ACT nasal spray     . gabapentin (NEURONTIN) 600 MG tablet Take 1 tablet (600 mg total) by mouth as directed.  Take 1 tablet in the AM and afternoon and 1 1/2 tabs at bedtime 315 tablet 2  . HYDROcodone-acetaminophen (NORCO/VICODIN) 5-325 MG tablet Take 1 tablet by mouth every 6 (six) hours as needed for moderate pain. 30 tablet 0  . lidocaine-prilocaine (EMLA) cream Apply to affected area once 30 g 3  . omeprazole (PRILOSEC) 40 MG capsule TAKE 1 CAPSULE BY MOUTH ONCE DAILY  FOR 30 DAYS  5  . ondansetron (ZOFRAN) 8 MG tablet Take 1 tablet (8 mg total) by mouth every 8 (eight) hours as needed for nausea or vomiting. 20 tablet 0  . OVER THE COUNTER MEDICATION Place 1 drop into both eyes 2 (two) times daily as needed (dry eyes). Over the counter lubricating eye drops    . polyethylene glycol (MIRALAX / GLYCOLAX) packet Take 17 g by mouth daily as needed.    . polyethylene glycol powder (GLYCOLAX/MIRALAX) 17 GM/SCOOP powder Take by mouth.    . Probiotic Product (PROBIOTIC DAILY PO) Take 1 capsule by mouth daily.     . prochlorperazine (COMPAZINE) 10 MG tablet Take 1 tablet (10 mg total) by mouth every 6 (six) hours as needed for nausea or vomiting. 60 tablet 3  . spironolactone (ALDACTONE) 25 MG tablet Take 25 mg by mouth daily.     No current facility-administered medications for this visit.    PHYSICAL EXAMINATION: ECOG PERFORMANCE STATUS: 1 - Symptomatic but completely ambulatory  Vitals:   07/05/19 0903  BP: (!) 142/72  Pulse: 76  Resp: 17  Temp: 98.3 F (36.8 C)  SpO2: 100%   Filed Weights   07/05/19 0903  Weight: 126 lb 8 oz (57.4 kg)    LABORATORY DATA:  I have reviewed the data as listed CMP Latest Ref Rng & Units 07/05/2019 06/07/2019 05/09/2019  Glucose 70 - 99 mg/dL 105(H) 176(H) 102(H)  BUN 8 - 23 mg/dL _0 Creatinine 0.44 - 1.00 mg/dL 0.75 0.82 0.73  Sodium 135 - 145 mmol/L 140 140 139  Potassium 3.5 - 5.1 mmol/L 3.9 3.8 3.7  Chloride 98 - 111 mmol/L 104 105 104  CO2 22 - 32 mmol/L _1 Calcium 8.9 - 10.3 mg/dL 9.8 9.6 9.4  Total Protein 6.5 - 8.1 g/dL 7.0 6.9 7.0  Total Bilirubin 0.3 - 1.2 mg/dL 0.9 0.7 0.6  Alkaline Phos 38 - 126 U/L 125 146(H) 130(H)  AST 15 - 41 U/L 36 31 38  ALT 0 - 44 U/L _2 Lab Results  Component Value Date   WBC 3.0 (L) 07/05/2019   HGB 12.8 07/05/2019   HCT 38.8 07/05/2019   MCV 93.9 07/05/2019   PLT 62 (L) 07/05/2019   NEUTROABS 2.1 07/05/2019    ASSESSMENT & PLAN:  Primary  cancer of upper outer quadrant of right female breast ([place]) Right breast invasive ductal carcinoma ER positive PR negative HER-2 positive Ki-67 44% multifocal disease 3/7 lymph nodes positive T2 N1 M0 stage IIB status post adjuvant chemotherapy with TCH followed by Herceptin maintenance, adjuvant radiation therapy and Letrozole. MRI 02/14/2015: T5 large destructive lesion with pathologic compression fracture and extensive epidural tumor, involvement of T4, excision metastatic carcinoma ER 60%, PR 0%, HER-2 positive ratio 2.71 average copy #7.45 03/27/2015: T4-6 decompression surgery for spinal cord compression PET-CT 12/10/17Rt para-spinal mass mildy hypermetabolic SUV 5.9, lytic cortical lesion 4.8 cm lesion left prox femur suv 8.8 favor osseus met disease Echo 04/12/2017 shows well preserved ejection fraction of 55-60%  Treatment summary:  1. Resumed anastrozole 05/21/2015, but it has caused significant hair loss so we switched her to exemestane 25 mg once daily 07/23/2015 2. Herceptin every 3 week started 04/10/2015 3. Bone metastases: Zometa every 3 months 4. Radiation therapy to the spine (Dr. [name]) 04/13/2015 to 05/19/2015 5. Radiation therapy to left femur 04/28/2016 to 05/12/2016  6. SRS to the brain lesion 11/24/2016 7.Kadcyla started 05/05/2017 discontinued 07/27/2017 8.Palbociclib +Faslodex +trastuzumab+ Pertuzumabstarted 08/10/2017, stopped 04/04/2018 9. Enhertu started 06/01/18 10 SRS to brain January 2021 -------------------------------------------------------------------------------------------------------------------------- XRT to T123/04/2019 PET CT scan 10/03/2018:stable, improvement in subcutaneous mets    Current treatment:Cycle18day 1Enhertu (Fam-trastuzumab-Durextecan) Echo7/2020shows EF of 55-60%. Completed every 6 months.   Toxicities: 1.  Fatigue: Stable 2.  Neuropathic pain: On gabapentin. 3.Hair thinning 4.  Thrombocytopenia: Monitoring  closely  Currently onevery 4-weekEnhertu, because of thrombocytopenia I am switching her to every 5-week Enhertu. Zometa will now be every 15 weeks. okay to treat with a platelet count of 62. CT CAP 07/03/2019: Stable bone metastases involving the right acromion, T12 and L2, stable compression fracture T5.  No new metastatic disease.  She will return in 5 weeks for labs, f/u    No orders of the defined types were placed in this encounter.  The patient has a good understanding of the overall plan. she agrees with it. she will call with any problems that may develop before the next visit here.  Total time spent: 30 mins including face to face time and time spent for planning, charting and coordination of care  [name], MD 07/05/2019  I, Cloyde Reams Dorshimer, am acting as scribe for Dr. [name].  I have reviewed the above documentation for accuracy and completeness, and I agree with the above.

## 2019-07-05 ENCOUNTER — Inpatient Hospital Stay: Payer: Medicare Other

## 2019-07-05 ENCOUNTER — Inpatient Hospital Stay (HOSPITAL_BASED_OUTPATIENT_CLINIC_OR_DEPARTMENT_OTHER): Payer: Medicare Other | Admitting: Hematology and Oncology

## 2019-07-05 ENCOUNTER — Inpatient Hospital Stay: Payer: Medicare Other | Attending: Hematology and Oncology

## 2019-07-05 ENCOUNTER — Other Ambulatory Visit: Payer: Self-pay

## 2019-07-05 VITALS — BP 142/72 | HR 76 | Temp 98.3°F | Resp 17 | Ht 64.0 in | Wt 126.5 lb

## 2019-07-05 DIAGNOSIS — D696 Thrombocytopenia, unspecified: Secondary | ICD-10-CM | POA: Diagnosis not present

## 2019-07-05 DIAGNOSIS — Z79811 Long term (current) use of aromatase inhibitors: Secondary | ICD-10-CM | POA: Diagnosis not present

## 2019-07-05 DIAGNOSIS — Z5112 Encounter for antineoplastic immunotherapy: Secondary | ICD-10-CM | POA: Diagnosis not present

## 2019-07-05 DIAGNOSIS — R918 Other nonspecific abnormal finding of lung field: Secondary | ICD-10-CM | POA: Diagnosis not present

## 2019-07-05 DIAGNOSIS — Z9221 Personal history of antineoplastic chemotherapy: Secondary | ICD-10-CM | POA: Insufficient documentation

## 2019-07-05 DIAGNOSIS — C50411 Malignant neoplasm of upper-outer quadrant of right female breast: Secondary | ICD-10-CM | POA: Diagnosis not present

## 2019-07-05 DIAGNOSIS — Z170 Estrogen receptor positive status [ER+]: Secondary | ICD-10-CM | POA: Insufficient documentation

## 2019-07-05 DIAGNOSIS — C7931 Secondary malignant neoplasm of brain: Secondary | ICD-10-CM | POA: Insufficient documentation

## 2019-07-05 DIAGNOSIS — C7951 Secondary malignant neoplasm of bone: Secondary | ICD-10-CM | POA: Insufficient documentation

## 2019-07-05 DIAGNOSIS — Z79899 Other long term (current) drug therapy: Secondary | ICD-10-CM | POA: Insufficient documentation

## 2019-07-05 DIAGNOSIS — G9529 Other cord compression: Secondary | ICD-10-CM

## 2019-07-05 DIAGNOSIS — C50911 Malignant neoplasm of unspecified site of right female breast: Secondary | ICD-10-CM

## 2019-07-05 DIAGNOSIS — Z95828 Presence of other vascular implants and grafts: Secondary | ICD-10-CM

## 2019-07-05 DIAGNOSIS — Z923 Personal history of irradiation: Secondary | ICD-10-CM | POA: Diagnosis not present

## 2019-07-05 DIAGNOSIS — Z23 Encounter for immunization: Secondary | ICD-10-CM | POA: Diagnosis not present

## 2019-07-05 LAB — CMP (CANCER CENTER ONLY)
ALT: 27 U/L (ref 0–44)
AST: 36 U/L (ref 15–41)
Albumin: 3.6 g/dL (ref 3.5–5.0)
Alkaline Phosphatase: 125 U/L (ref 38–126)
Anion gap: 10 (ref 5–15)
BUN: 13 mg/dL (ref 8–23)
CO2: 26 mmol/L (ref 22–32)
Calcium: 9.8 mg/dL (ref 8.9–10.3)
Chloride: 104 mmol/L (ref 98–111)
Creatinine: 0.75 mg/dL (ref 0.44–1.00)
GFR, Est AFR Am: >60 mL/min (ref >60)
GFR, Estimated: >60 mL/min (ref >60)
Glucose, Bld: 105 mg/dL — ABNORMAL HIGH (ref 70–99)
Potassium: 3.9 mmol/L (ref 3.5–5.1)
Sodium: 140 mmol/L (ref 135–145)
Total Bilirubin: 0.9 mg/dL (ref 0.3–1.2)
Total Protein: 7 g/dL (ref 6.5–8.1)

## 2019-07-05 LAB — CBC WITH DIFFERENTIAL (CANCER CENTER ONLY)
Abs Immature Granulocytes: 0.01 10*3/uL (ref 0.00–0.07)
Basophils Absolute: 0 10*3/uL (ref 0.0–0.1)
Basophils Relative: 1 %
Eosinophils Absolute: 0.1 10*3/uL (ref 0.0–0.5)
Eosinophils Relative: 3 %
HCT: 38.8 % (ref 36.0–46.0)
Hemoglobin: 12.8 g/dL (ref 12.0–15.0)
Immature Granulocytes: 0 %
Lymphocytes Relative: 13 %
Lymphs Abs: 0.4 10*3/uL — ABNORMAL LOW (ref 0.7–4.0)
MCH: 31 pg (ref 26.0–34.0)
MCHC: 33 g/dL (ref 30.0–36.0)
MCV: 93.9 fL (ref 80.0–100.0)
Monocytes Absolute: 0.3 10*3/uL (ref 0.1–1.0)
Monocytes Relative: 11 %
Neutro Abs: 2.1 10*3/uL (ref 1.7–7.7)
Neutrophils Relative %: 72 %
Platelet Count: 62 10*3/uL — ABNORMAL LOW (ref 150–400)
RBC: 4.13 MIL/uL (ref 3.87–5.11)
RDW: 15.3 % (ref 11.5–15.5)
WBC Count: 3 10*3/uL — ABNORMAL LOW (ref 4.0–10.5)
nRBC: 0 % (ref 0.0–0.2)

## 2019-07-05 MED ORDER — ACETAMINOPHEN 325 MG PO TABS
ORAL_TABLET | ORAL | Status: AC
  Filled 2019-07-05: qty 2

## 2019-07-05 MED ORDER — SODIUM CHLORIDE 0.9% FLUSH
10.0000 mL | INTRAVENOUS | Status: DC | PRN
  Administered 2019-07-05: 10 mL
  Filled 2019-07-05: qty 10

## 2019-07-05 MED ORDER — PALONOSETRON HCL INJECTION 0.25 MG/5ML
0.2500 mg | Freq: Once | INTRAVENOUS | Status: AC
  Administered 2019-07-05: 10:00:00 0.25 mg via INTRAVENOUS

## 2019-07-05 MED ORDER — HEPARIN SOD (PORK) LOCK FLUSH 100 UNIT/ML IV SOLN
500.0000 [IU] | Freq: Once | INTRAVENOUS | Status: AC | PRN
  Administered 2019-07-05: 500 [IU]
  Filled 2019-07-05: qty 5

## 2019-07-05 MED ORDER — ACETAMINOPHEN 325 MG PO TABS
650.0000 mg | ORAL_TABLET | Freq: Once | ORAL | Status: AC
  Administered 2019-07-05: 10:00:00 650 mg via ORAL

## 2019-07-05 MED ORDER — SODIUM CHLORIDE 0.9% FLUSH
10.0000 mL | Freq: Once | INTRAVENOUS | Status: AC
  Administered 2019-07-05: 09:00:00 10 mL
  Filled 2019-07-05: qty 10

## 2019-07-05 MED ORDER — PALONOSETRON HCL INJECTION 0.25 MG/5ML
INTRAVENOUS | Status: AC
  Filled 2019-07-05: qty 5

## 2019-07-05 MED ORDER — DEXTROSE 5 % IV SOLN
Freq: Once | INTRAVENOUS | Status: AC
  Filled 2019-07-05: qty 250

## 2019-07-05 MED ORDER — FAM-TRASTUZUMAB DERUXTECAN-NXKI CHEMO 100 MG IV SOLR
150.0000 mg | Freq: Once | INTRAVENOUS | Status: AC
  Administered 2019-07-05: 150 mg via INTRAVENOUS
  Filled 2019-07-05: qty 7.5

## 2019-07-05 NOTE — Assessment & Plan Note (Signed)
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 MRI 02/14/2015: T5 large destructive lesion with pathologic compression fracture and extensive epidural tumor, involvement of T4, excision metastatic carcinoma ER 60%, PR 0%, HER-2 positive ratio 2.71 average copy #7.45 03/27/2015: T4-6 decompression surgery for spinal cord compression PET-CT 12/10/17Rt para-spinal mass mildy hypermetabolic SUV 5.9, lytic cortical lesion 4.8 cm lesion left prox femur suv 8.8 favor osseus met disease Echo 04/12/2017 shows well preserved ejection fraction of 55-60%  Treatment summary: 1. Resumed anastrozole 05/21/2015, but it has caused significant hair loss so we switched her to exemestane 25 mg once daily 07/23/2015 2. Herceptin every 3 week started 04/10/2015 3. Bone metastases: Zometa every 3 months 4. Radiation therapy to the spine (Dr. [name]) 04/13/2015 to 05/19/2015 5. Radiation therapy to left femur 04/28/2016 to 05/12/2016  6. SRS to the brain lesion 11/24/2016 7.Kadcyla started 05/05/2017 discontinued 07/27/2017 8.Palbociclib +Faslodex +trastuzumab+ Pertuzumabstarted 08/10/2017, stopped 04/04/2018 9. Enhertu started 06/01/18 10 SRS to brain January 2021 -------------------------------------------------------------------------------------------------------------------------- XRT to T123/04/2019 PET CT scan 10/03/2018:stable, improvement in subcutaneous mets    Current treatment:Cycle18day 1Enhertu (Fam-trastuzumab-Durextecan) Echo7/2020shows EF of 55-60%. Completed every 6 months.   Toxicities: 1.  Fatigue: Stable 2.  Neuropathic pain: On gabapentin. 3.Hair thinning 4.  Thrombocytopenia: Monitoring closely  Currently onevery 4-weekEnhertu CT CAP 07/03/2019:  Stable bone metastases involving the right acromion, T12 and L2, stable compression fracture T5.  No new metastatic disease.  She will return in 4 weeks for labs, f/u

## 2019-07-05 NOTE — Patient Instructions (Signed)
[facility] Discharge Instructions for Patients Receiving Chemotherapy  Today you received the following chemotherapy agents: enhertu  To help prevent nausea and vomiting after your treatment, we encourage you to take your nausea medication as directed.   If you develop nausea and vomiting that is not controlled by your nausea medication, call the clinic.   BELOW ARE SYMPTOMS THAT SHOULD BE REPORTED IMMEDIATELY:  *FEVER GREATER THAN 100.5 F  *CHILLS WITH OR WITHOUT FEVER  NAUSEA AND VOMITING THAT IS NOT CONTROLLED WITH YOUR NAUSEA MEDICATION  *UNUSUAL SHORTNESS OF BREATH  *UNUSUAL BRUISING OR BLEEDING  TENDERNESS IN MOUTH AND THROAT WITH OR WITHOUT PRESENCE OF ULCERS  *URINARY PROBLEMS  *BOWEL PROBLEMS  UNUSUAL RASH Items with * indicate a potential emergency and should be followed up as soon as possible.  Feel free to call the clinic should you have any questions or concerns. The clinic phone number is [phone].  Please show the [place] at check-in to the Emergency Department and triage nurse.

## 2019-07-08 ENCOUNTER — Telehealth: Payer: Self-pay | Admitting: Hematology and Oncology

## 2019-07-08 NOTE — Telephone Encounter (Signed)
I talk with patient regarding schedule  

## 2019-08-05 NOTE — Progress Notes (Signed)
Pharmacist Chemotherapy Monitoring - Follow Up Assessment    I verify that I have reviewed each item in the below checklist:  . Regimen for the patient is scheduled for the appropriate day and plan matches scheduled date. Marland Kitchen Appropriate non-routine labs are ordered dependent on drug ordered. . If applicable, additional medications reviewed and ordered per protocol based on lifetime cumulative doses and/or treatment regimen.   Plan for follow-up and/or issues identified: No . I-vent associated with next due treatment: No . MD and/or nursing notified: No  Britt Boozer 08/05/2019 9:48 AM

## 2019-08-07 ENCOUNTER — Other Ambulatory Visit: Payer: Self-pay | Admitting: Radiation Therapy

## 2019-08-07 DIAGNOSIS — C7931 Secondary malignant neoplasm of brain: Secondary | ICD-10-CM

## 2019-08-09 ENCOUNTER — Other Ambulatory Visit: Payer: Self-pay

## 2019-08-09 ENCOUNTER — Inpatient Hospital Stay: Payer: Medicare Other | Attending: Hematology and Oncology

## 2019-08-09 ENCOUNTER — Inpatient Hospital Stay: Payer: Medicare Other

## 2019-08-09 VITALS — BP 147/67 | HR 68 | Temp 98.0°F | Resp 18 | Wt 126.0 lb

## 2019-08-09 DIAGNOSIS — C7951 Secondary malignant neoplasm of bone: Secondary | ICD-10-CM

## 2019-08-09 DIAGNOSIS — Z923 Personal history of irradiation: Secondary | ICD-10-CM | POA: Insufficient documentation

## 2019-08-09 DIAGNOSIS — C7931 Secondary malignant neoplasm of brain: Secondary | ICD-10-CM | POA: Diagnosis not present

## 2019-08-09 DIAGNOSIS — Z79811 Long term (current) use of aromatase inhibitors: Secondary | ICD-10-CM | POA: Insufficient documentation

## 2019-08-09 DIAGNOSIS — Z170 Estrogen receptor positive status [ER+]: Secondary | ICD-10-CM | POA: Diagnosis not present

## 2019-08-09 DIAGNOSIS — D696 Thrombocytopenia, unspecified: Secondary | ICD-10-CM | POA: Insufficient documentation

## 2019-08-09 DIAGNOSIS — C50411 Malignant neoplasm of upper-outer quadrant of right female breast: Secondary | ICD-10-CM | POA: Insufficient documentation

## 2019-08-09 DIAGNOSIS — Z9221 Personal history of antineoplastic chemotherapy: Secondary | ICD-10-CM | POA: Insufficient documentation

## 2019-08-09 DIAGNOSIS — R918 Other nonspecific abnormal finding of lung field: Secondary | ICD-10-CM | POA: Insufficient documentation

## 2019-08-09 DIAGNOSIS — Z95828 Presence of other vascular implants and grafts: Secondary | ICD-10-CM

## 2019-08-09 DIAGNOSIS — C50911 Malignant neoplasm of unspecified site of right female breast: Secondary | ICD-10-CM

## 2019-08-09 DIAGNOSIS — Z5112 Encounter for antineoplastic immunotherapy: Secondary | ICD-10-CM | POA: Insufficient documentation

## 2019-08-09 DIAGNOSIS — Z79899 Other long term (current) drug therapy: Secondary | ICD-10-CM | POA: Diagnosis not present

## 2019-08-09 DIAGNOSIS — G9529 Other cord compression: Secondary | ICD-10-CM

## 2019-08-09 LAB — CMP (CANCER CENTER ONLY)
ALT: 24 U/L (ref 0–44)
AST: 37 U/L (ref 15–41)
Albumin: 3.6 g/dL (ref 3.5–5.0)
Alkaline Phosphatase: 116 U/L (ref 38–126)
Anion gap: 13 (ref 5–15)
BUN: 10 mg/dL (ref 8–23)
CO2: 23 mmol/L (ref 22–32)
Calcium: 9.6 mg/dL (ref 8.9–10.3)
Chloride: 104 mmol/L (ref 98–111)
Creatinine: 0.74 mg/dL (ref 0.44–1.00)
GFR, Est AFR Am: >60 mL/min (ref >60)
GFR, Estimated: >60 mL/min (ref >60)
Glucose, Bld: 100 mg/dL — ABNORMAL HIGH (ref 70–99)
Potassium: 4 mmol/L (ref 3.5–5.1)
Sodium: 140 mmol/L (ref 135–145)
Total Bilirubin: 0.9 mg/dL (ref 0.3–1.2)
Total Protein: 7.1 g/dL (ref 6.5–8.1)

## 2019-08-09 LAB — CBC WITH DIFFERENTIAL (CANCER CENTER ONLY)
Abs Immature Granulocytes: 0.01 10*3/uL (ref 0.00–0.07)
Basophils Absolute: 0 10*3/uL (ref 0.0–0.1)
Basophils Relative: 1 %
Eosinophils Absolute: 0.1 10*3/uL (ref 0.0–0.5)
Eosinophils Relative: 3 %
HCT: 39.7 % (ref 36.0–46.0)
Hemoglobin: 13.2 g/dL (ref 12.0–15.0)
Immature Granulocytes: 0 %
Lymphocytes Relative: 13 %
Lymphs Abs: 0.4 10*3/uL — ABNORMAL LOW (ref 0.7–4.0)
MCH: 31.1 pg (ref 26.0–34.0)
MCHC: 33.2 g/dL (ref 30.0–36.0)
MCV: 93.6 fL (ref 80.0–100.0)
Monocytes Absolute: 0.3 10*3/uL (ref 0.1–1.0)
Monocytes Relative: 11 %
Neutro Abs: 2.1 10*3/uL (ref 1.7–7.7)
Neutrophils Relative %: 72 %
Platelet Count: 69 10*3/uL — ABNORMAL LOW (ref 150–400)
RBC: 4.24 MIL/uL (ref 3.87–5.11)
RDW: 14.8 % (ref 11.5–15.5)
WBC Count: 2.9 10*3/uL — ABNORMAL LOW (ref 4.0–10.5)
nRBC: 0 % (ref 0.0–0.2)

## 2019-08-09 MED ORDER — SODIUM CHLORIDE 0.9% FLUSH
10.0000 mL | INTRAVENOUS | Status: DC | PRN
  Administered 2019-08-09: 10 mL
  Filled 2019-08-09: qty 10

## 2019-08-09 MED ORDER — PALONOSETRON HCL INJECTION 0.25 MG/5ML
INTRAVENOUS | Status: AC
  Filled 2019-08-09: qty 5

## 2019-08-09 MED ORDER — SODIUM CHLORIDE 0.9% FLUSH
10.0000 mL | Freq: Once | INTRAVENOUS | Status: AC
  Administered 2019-08-09: 10 mL
  Filled 2019-08-09: qty 10

## 2019-08-09 MED ORDER — FAM-TRASTUZUMAB DERUXTECAN-NXKI CHEMO 100 MG IV SOLR
150.0000 mg | Freq: Once | INTRAVENOUS | Status: AC
  Administered 2019-08-09: 150 mg via INTRAVENOUS
  Filled 2019-08-09: qty 7.5

## 2019-08-09 MED ORDER — PALONOSETRON HCL INJECTION 0.25 MG/5ML
0.2500 mg | Freq: Once | INTRAVENOUS | Status: AC
  Administered 2019-08-09: 0.25 mg via INTRAVENOUS

## 2019-08-09 MED ORDER — ACETAMINOPHEN 325 MG PO TABS
650.0000 mg | ORAL_TABLET | Freq: Once | ORAL | Status: AC
  Administered 2019-08-09: 650 mg via ORAL

## 2019-08-09 MED ORDER — HEPARIN SOD (PORK) LOCK FLUSH 100 UNIT/ML IV SOLN
500.0000 [IU] | Freq: Once | INTRAVENOUS | Status: AC | PRN
  Administered 2019-08-09: 500 [IU]
  Filled 2019-08-09: qty 5

## 2019-08-09 MED ORDER — CYANOCOBALAMIN 1000 MCG/ML IJ SOLN
INTRAMUSCULAR | Status: AC
  Filled 2019-08-09: qty 1

## 2019-08-09 MED ORDER — DEXTROSE 5 % IV SOLN
Freq: Once | INTRAVENOUS | Status: AC
  Filled 2019-08-09: qty 250

## 2019-08-09 MED ORDER — CYANOCOBALAMIN 1000 MCG/ML IJ SOLN
1000.0000 ug | Freq: Once | INTRAMUSCULAR | Status: AC
  Administered 2019-08-09: 1000 ug via INTRAMUSCULAR

## 2019-08-09 MED ORDER — ACETAMINOPHEN 325 MG PO TABS
ORAL_TABLET | ORAL | Status: AC
  Filled 2019-08-09: qty 2

## 2019-08-09 NOTE — Patient Instructions (Signed)
[facility] Discharge Instructions for Patients Receiving Chemotherapy  Today you received the following chemotherapy agents Fam-trastuzumab (ENHERTU).  To help prevent nausea and vomiting after your treatment, we encourage you to take your nausea medication as prescribed.   If you develop nausea and vomiting that is not controlled by your nausea medication, call the clinic.   BELOW ARE SYMPTOMS THAT SHOULD BE REPORTED IMMEDIATELY:  *FEVER GREATER THAN 100.5 F  *CHILLS WITH OR WITHOUT FEVER  NAUSEA AND VOMITING THAT IS NOT CONTROLLED WITH YOUR NAUSEA MEDICATION  *UNUSUAL SHORTNESS OF BREATH  *UNUSUAL BRUISING OR BLEEDING  TENDERNESS IN MOUTH AND THROAT WITH OR WITHOUT PRESENCE OF ULCERS  *URINARY PROBLEMS  *BOWEL PROBLEMS  UNUSUAL RASH Items with * indicate a potential emergency and should be followed up as soon as possible.  Feel free to call the clinic should you have any questions or concerns. The clinic phone number is [phone].  Please show the CHEMO ALERT CARD at check-in to the Emergency Department and triage nurse.   

## 2019-08-09 NOTE — Progress Notes (Signed)
Verbal order from Wilber Bihari, NP: Faythe Ghee to treat patient with Plt of 69.

## 2019-09-02 ENCOUNTER — Ambulatory Visit
Admission: RE | Admit: 2019-09-02 | Discharge: 2019-09-02 | Disposition: A | Discharge status: Home / Self Care | Payer: Medicare Other | Source: Ambulatory Visit | Attending: Radiation Oncology | Admitting: Radiation Oncology

## 2019-09-02 ENCOUNTER — Other Ambulatory Visit: Payer: Self-pay

## 2019-09-02 DIAGNOSIS — C7931 Secondary malignant neoplasm of brain: Secondary | ICD-10-CM

## 2019-09-02 MED ORDER — GADOBENATE DIMEGLUMINE 529 MG/ML IV SOLN
10.0000 mL | Freq: Once | INTRAVENOUS | Status: AC | PRN
  Administered 2019-09-02: 10 mL via INTRAVENOUS

## 2019-09-03 ENCOUNTER — Telehealth: Payer: Self-pay

## 2019-09-03 NOTE — Telephone Encounter (Signed)
Appointment reminder for 09/04/19 telephone encounter for imaging  Results patient verbalized she understood

## 2019-09-04 ENCOUNTER — Encounter: Payer: Self-pay | Admitting: Urology

## 2019-09-04 ENCOUNTER — Inpatient Hospital Stay: Payer: Medicare Other

## 2019-09-04 ENCOUNTER — Ambulatory Visit
Admission: RE | Admit: 2019-09-04 | Discharge: 2019-09-04 | Disposition: A | Discharge status: Home / Self Care | Payer: Medicare Other | Source: Ambulatory Visit | Attending: Urology | Admitting: Urology

## 2019-09-04 ENCOUNTER — Other Ambulatory Visit: Payer: Self-pay

## 2019-09-04 DIAGNOSIS — Z853 Personal history of malignant neoplasm of breast: Secondary | ICD-10-CM | POA: Diagnosis not present

## 2019-09-04 DIAGNOSIS — C7931 Secondary malignant neoplasm of brain: Secondary | ICD-10-CM

## 2019-09-04 DIAGNOSIS — C7951 Secondary malignant neoplasm of bone: Secondary | ICD-10-CM | POA: Diagnosis not present

## 2019-09-04 DIAGNOSIS — Z923 Personal history of irradiation: Secondary | ICD-10-CM | POA: Diagnosis not present

## 2019-09-04 NOTE — Progress Notes (Signed)
Radiation Oncology         ([phone]) [phone] ________________________________  Name: Heather [name] MRN: [id number]  Date: 09/04/2019  DOB: 1945-12-09  Follow up Visit Note- Conducted via telephone due to current COVID-19 concerns for limiting patient exposure  CC: Heather Stains, MD  [name], MD  Diagnosis:   74 y.o. woman with ER positive, PR negative, HER-2 positive metastatic breast cancer with multifocal metastases involving the spine, bony skeleton, lung, and brain- most recently with an enlarging central cerebellar metastasis.   Interval Since Last Radiation: 3 months 05/31/19: SRS brain/ The 102m central cerebellar target was treated to a prescription dose of 20 Gy in a single fraction.  08/07/18:  SRS Spine/ The targeted metastases in the T12 and L2 vertebral bodies were treated to a prescription dose of 18 Gy in 1 fraction.   05/14/2018 - 05/25/2018:  Chest Wall, Right Upper Posterior subcutaneous lesions / treated to 30 Gy delivered in 10 fractions of 3 Gy  05/18/2017 - 05/31/2017:  The thoracic spine (T7-T9) was treated to 30 Gy in 10 fractions of 3 Gy.  6/27-7/18/18 SRS and palliative treatment: 1.  The rib 8th was treated to 30 Gy in 10 fractions 2.  Right cerebellar 6 mm target was treated using 3 Dynamic Conformal Arcs to a prescription dose of 20 Gy in one fraction  04/27/16-05/12/16:  Left femur/ 30 Gy in 10 fractions   04/13/2015-05/19/2015 Postop:  55 Gy in 25 fractions of 2.2 Gy to the thoracic spine   10/25/12-12/11/12: 50.4 Gy to the right chest wall and regional lymph nodes in 28 fractions with a 10 Gy boost to the right chest wall in 1 session.  Narrative:  In summary, Heather [name] a well known patient to our service with a history of metastatic breast cancer originally treated in 2014 to the chest wall and regional lymph nodes. She developed recurrent disease within the thoracic spine and subsequently has undergone 3 surgeries, in addition to radiotherapy, with her  most recent surgery being on 06/13/2016 where she had removal of thoracic hardware and replacement of pedicle screws with stabilization.  Of note previously placed screws between T3, T4 T6 and T7 were identified and dissection was carried out inferiorly to T8, T9-T10 and T11, T8 through T11 pedicle screw trajectories were placed bilaterally. She recovered well since treatment to her spine as she had been paralyzed in 2016, and with rigorous therapy has been able to make an impressive recovery. She continues on systemic therapy under the care of Dr. GLindi Adieand is also followed along in the brain and spine oncology conference. PET scan on 10/10/16 revealed a new hypermetabolic 2.5 x 1.1 cm lesion in the posterior right 8th rib. She also mentioned having new onset headaches, dizziness, and an episode of visual change,so an MRI brain was performed 11/04/16 demonstrating a solitary contrast-enhancing lesion within the right cerebellum, measuring 6 x 2 mm and lying just beneath the right tentorial leaflet and concerning for a metastatic lesion.  This solitary lesion was confirmed with SRS protocol MRI brain on 11/16/16 with no new lesions noted. This lesion was treated with SRS on 11/23/16 which she tolerated very well. She also completed palliative radiotherapy to the right eighth rib from 11/02/16 - 11/23/16 with significant improvement in her pain. She remained on Herceptin, Perjeta, Faslodex and Zometa for systemic disease under the care and direction of Dr. GLindi Adie  She developed increased pain in the T8 region and also had PET positive findings  in this region, as well as hypermetabolic right lower lobe mass concerning for progressive metastatic disease on follow up imaging 04/18/2017, despite systemic treatment. Her systemic therapy was switched to Kadcyla on 05/05/17 with discontinuation of Faslodex, Herceptin, and Perjeta.  She proceeded with palliative radiotherapy to 30 Gy in 10 fractions to the thoracic spine  (T7-T9) from 05/18/2017 - 05/31/2017.  This treatment was tolerated very well.  A repeat brain MRI performed 06/15/17 showed no new or progressive contrast enhancing lesions and no residual abnormality at the site of the previous treated right cerebellar lesion but Chest CT on 07/26/17 showed clear disease progression with interval increase in size of the multiple enhancing masses posteriorly overlying the spine, interval development of left axillary and mediastinal adenopathy, increase in the size of lytic lesions in T8-T9 and left ninth rib, as well as increased groundglass opacities right lower lobe lung. Therefore, she was switched to Palbociclib + Faslodex + trastuzumab + Pertuzumab, started 08/10/17.   MRI T-spine on 09/21/17 showed the known T12 lesion had slightly progressed by a few millimeters from 03/15/17 and there was a new lesion at L2 which was not present on lumbar MRI from 07/13/16. Follow up systemic imaging on 12/13/17, showed a good response to treatment with improved LAN and decreased subcutaneous nodules in the upper thoracic region.  Follow up brain MRI imaging has continued to show interval increase in size the right parietal calvarium metastasis with stable appearance in size of a right frontal calvarial metastasis and no parenchymal or dural metastases. PET scan from 04/03/2018 unfortunately showed significant disease progression despite systemic therapy.  Her systemic chemotherapy was discontinued on 04/04/2018 due to disease progression and she was switched to [facility] (Fam-trastuzumab-Durextecan) q 3 weeks beginning 06/01/18.  Repeat MRI imaging of the brain and thoracic spine was performed on 07/12/2018.  Brain imaging demonstrated 3 skull metastases ranging from 5 to 14 mm in diameter which appeared stable to slightly increased since December 2019 but no new metastases or brain parenchymal or dural metastases identified.  On thoracic spine imaging there was enlargement of the left paracentral T12  vertebral body metastasis increased from 14 mm in May 2019 to 20 mm currently without associated pathologic fracture and otherwise stable appearance of previously treated lesions in the thoracic spine with no new disease noted.  The posterior subcutaneous nodular metastases appear regressed.  Her imaging studies were reviewed and discussed at the multidisciplinary brain and spine conference on 07/16/2018 and the consensus recommendation was to proceed with stereotactic radiosurgery [facility]) to the progressive disease at T12 which was completed on 08/07/18.  At the time of her [facility] for treatment planning, it was also noted that she had an enlarging lesion in the vertebral body of L2 as compared to her PET scan from 03/28/2018 so this lesion was included in the planning and was treated with a single fraction SRS at the time of T12 treatment as well. She tolerated the treatment well and did not experience any ill side effects.  Follow up systemic imaging with PET 10/03/18 showed a good response to treatment with less activity in the recently treated lesions at L2 and T12, no new bony lesions, as well as resolution of subcutaneous nodules in the upper back and pubic area and resolution of right adrenal hypermetabolic mass.  Her brain MRI from 10/13/18 was also stable with no visible intraparenchymal metastases to the brain and an unchanged appearance of 3 calvarial metastases.  She has longstanding weakness in bilateral LEs,  unchanged recently and she associates this with her inactivity due to pronounced fatigue following systemic treatments with ENHERTU.  She reports that it begins to improve approximately 2 weeks after her treatments when she is able to become more mobile but resumes following each treatment every 3 weeks.  She denies any new or increased paraesthesias in the lower extremities but reports neuropathy in bilateral UEs as well as "nerve pain" in the right axilla that travels down her right side/torso.  She  also reports chronic right>left jaw pain and reports that her dentist has told her previously that she had some loss of bone in the right jaw based on xray evaluation.  She is taking Zometa q 3 months and will discuss this with [name].   A follow up MRI brain scan from 05/17/2019 showed a slowly enlarging metastasis at the superior cerebellar vermis measuring 4 mm but without mass-effect or edema.  3 additional known calvarial metastases did not show any growth or progression.  After discussion of these findings, the patient elected to proceed with Foster treatment to the enlarging lesion and this was completed in a single fraction on 05/31/2019. She tolerated the treatment very well, without any ill side effects.  INTERVAL HISTORY:   I spoke with the patient to conduct her routine scheduled 3 month follow up visit to review the most recent MRI brain scan via telephone to spare the patient unnecessary potential exposure in the healthcare setting during the current COVID-19 pandemic.  The patient was notified in advance and gave permission to proceed with this visit format.   She reports that she has continued doing well overall and has recovered well from the effects of her most recent SRS.  She denies headaches, dizziness, N/V or visual changes.     The most recent MRI brain scan performed 09/01/19 shows stability of the previously treated lesions and calvarial lesions in the right frontal and parietal regions. There is a slight increase in size (from 1.2 cm to 1.6 cm) of a right temporal calvarial metastasis but no new metastases identified and no parenchymal disease.  This scan was reviewed at conference this morning and consensus recommendation is to continue to monitor this lesion on routine follow up scans.  She has continued her systemic therapy with ENHERTU under the care and direction of [name] having completed 18 cycles as of 08/09/19 and the current plan is to continue on this regimen given her  stability of systemic disease noted on recent CT C/A/P from 07/02/19.   On review of systems, the patient reports that she continues doing well overall.  She continues with moderate fatigue which is tolerable and not significantly interfering with her daily activities aside from the first 2 weeks after systemic treatments.  She has tolerated her systemic therapy much better since her dose was reduced on 10/05/18.  She has persistent low grade nausea but no vomiting and is able to eat.  She remains as active as she can be.  She has noted continued improvement in the size of the subcutaneous nodules and in the burning/tingling pain that radiated from the left aspect of the spine underneath her breast since completing radiotherapy. She has not had recent changes in visual or auditory acuity, tinnitus, increased imbalance, tremor or seizure activity. She denies recent headaches, sternal chest pain, shortness of breath, cough, fevers, chills, night sweats, or recent unintended weight changes. She denies any bowel or bladder disturbances, and denies abdominal pain or vomiting currently. She denies  any new musculoskeletal or joint aches or pains, new skin lesions or concerns aside from those mentioned above in the HPI.  A complete review of systems is obtained and is otherwise negative.  Past Medical History:  Past Medical History:  Diagnosis Date  . Anemia    hx of  . Anxiety    r/t updated surgery  . Breast cancer ([place])    right  . Cataract    immature-not sure of which eye  . Complication of anesthesia    pt states she is sensitive to meds  . Dizziness    has been going on for 7month;medical MD aware  . Genetic testing 07/11/2016   Test Results: Normal; No pathogenic mutations detected  Genes Analyzed: 43 genes on [employer]'s Common Cancers panel (APC, ATM, AXIN2, BARD1, BMPR1A, BRCA1, BRCA2, BRIP1, CDH1, CDKN2A, CHEK2, DICER1, EPCAM, GREM1, HOXB13, KIT, MEN1, MLH1, MSH2, MSH6, MUTYH, NBN, NF1, PALB2,  PDGFRA, PMS2, POLD1, POLE, PTEN, RAD50, RAD51C, RAD51D, SDHA, SDHB, SDHC, SDHD, SMAD4, SMARCA4, STK11, TP53, TSC1, TSC2, VHL).  .Marland KitchenGERD (gastroesophageal reflux disease)    Tums prn  . H. pylori infection   . H/O hiatal hernia   . Hemorrhoids   . History of UTI   . Hx of radiation therapy 10/25/12- 12/11/12   right chest wall/regional lymph nodes 5040 cGy, 28 sessions, right chest wall boost 1000 cGy 1 session  . Hx: UTI (urinary tract infection)   . Hyperlipidemia    but not on meds;diet and exercise controlled  . Hypertension    recently started Aldactone   . Insomnia    takes Melatoniin daily  . Metastasis to spinal column ([facility])    T5, Left  Femur cancer- radiation   . Neuropathy    "from back surgery"  . PONV (postoperative nausea and vomiting)    pt experienced hair loss, confusion and combative- 02/15/15  . Right knee pain   . Right shoulder pain   . Skin cancer    squamous, Nodule to back - "same cancer as breast."  . Urinary frequency    d/t taking Aldactone    Past Surgical History: Past Surgical History:  Procedure Laterality Date  . COLONOSCOPY    . cyst removed from left breast  1970  . DECOMPRESSIVE LUMBAR LAMINECTOMY LEVEL 4 N/A 02/15/2015   Procedure: DECOMPRESSION T5 AND T3-T7 STABALIZATION;  Surgeon: NConsuella Lose MD;  [place];  Service: Neurosurgery;  Laterality: N/A;  . ESOPHAGOGASTRODUODENOSCOPY    . LAMINECTOMY N/A 03/27/2015   Procedure: Thoracic Four-Thoracic Six Laminectomy for tumor;  Surgeon: NConsuella Lose MD;  [place] [facility];  Service: Neurosurgery;  Laterality: N/A;  T4-T6 Laminectomy  . left wrist surgery   2004   with plate  . MASTECTOMY MODIFIED RADICAL  05/10/2012   Procedure: MASTECTOMY MODIFIED RADICAL;  Surgeon: PMerrie Roof MD;  [place]  Service: General;  Laterality: Right;  RIGHT MODIFIED RADICAL MASTECTOMY  . MASTECTOMY, RADICAL Right   . MINOR BREAST BIOPSY Left 05/16/2016   Procedure: EXCISION  OF BACK NODULE;  Surgeon: PAutumn MessingIII, MD;  [place] [facility]  Service: General;  Laterality: Left;  . Nodule  back  05/17/2015  . PORTACATH PLACEMENT  05/10/2012   Procedure: INSERTION PORT-A-CATH;  Surgeon: PMerrie Roof MD;  [facility];  Service: General;  Laterality: Left;    Social History:  Social History   Socioeconomic History  . Marital status: Married    Spouse  name: Not on file  . Number of children: 2  . Years of education: Not on file  . Highest education level: Not on file  Occupational History  . Not on file  Tobacco Use  . Smoking status: Never Smoker  . Smokeless tobacco: Never Used  Substance and Sexual Activity  . Alcohol use: No  . Drug use: No  . Sexual activity: Yes    Birth control/protection: Post-menopausal  Other Topics Concern  . Not on file  Social History Narrative  . Not on file   Social Determinants of Health   Financial Resource Strain:   . Difficulty of Paying Living Expenses:   Food Insecurity:   . Worried About Charity fundraiser in the Last Year:   . Arboriculturist in the Last Year:   Transportation Needs:   . Film/video editor (Medical):   Marland Kitchen Lack of Transportation (Non-Medical):   Physical Activity:   . Days of Exercise per Week:   . Minutes of Exercise per Session:   Stress:   . Feeling of Stress :   Social Connections:   . Frequency of Communication with Friends and Family:   . Frequency of Social Gatherings with Friends and Family:   . Attends Religious Services:   . Active Member of Clubs or Organizations:   . Attends Archivist Meetings:   Marland Kitchen Marital Status:   Intimate Partner Violence:   . Fear of Current or Ex-Partner:   . Emotionally Abused:   Marland Kitchen Physically Abused:   . Sexually Abused:     Family History: Family History  Problem Relation Age of Onset  . Leukemia Mother 48       deceased 34  . Stroke Father   . Diabetes Mellitus II Father   . Prostate cancer Brother  18       currently 66  . Stomach cancer Maternal Grandmother 80       deceased 13  . Prostate cancer Brother 49       currently 61     ALLERGIES:  is allergic to other; acyclovir and related; chlorhexidine; zanaflex [tizanidine hcl]; codeine; keflex [cephalexin]; naprosyn [naproxen]; oxycodone; tramadol; and tussin [guaifenesin].  Meds: Current Outpatient Medications  Medication Sig Dispense Refill  . ALPRAZolam (XANAX) 0.25 MG tablet Take 1 tablet by mouth as needed.    . Calcium Carbonate Antacid (TUMS PO) Take 2 tablets by mouth 2 (two) times daily as needed (acid reflux).    . Cholecalciferol (VITAMIN D) 2000 UNITS tablet Take 2,000 Units by mouth daily.    . cycloSPORINE (RESTASIS) 0.05 % ophthalmic emulsion 1 drop 2 (two) times daily.    Marland Kitchen gabapentin (NEURONTIN) 600 MG tablet Take 1 tablet (600 mg total) by mouth as directed. Take 1 tablet in the AM and afternoon and 1 1/2 tabs at bedtime 315 tablet 2  . HYDROcodone-acetaminophen (NORCO/VICODIN) 5-325 MG tablet Take 1 tablet by mouth every 6 (six) hours as needed for moderate pain. 30 tablet 0  . lidocaine-prilocaine (EMLA) cream Apply to affected area once 30 g 3  . omeprazole (PRILOSEC) 40 MG capsule TAKE 1 CAPSULE BY MOUTH ONCE DAILY FOR 30 DAYS  5  . ondansetron (ZOFRAN) 8 MG tablet Take 1 tablet (8 mg total) by mouth every 8 (eight) hours as needed for nausea or vomiting. 20 tablet 0  . OVER THE COUNTER MEDICATION Place 1 drop into both eyes 2 (two) times daily as needed (dry eyes). Over the counter  lubricating eye drops    . polyethylene glycol (MIRALAX / GLYCOLAX) packet Take 17 g by mouth daily as needed.    . polyethylene glycol powder (GLYCOLAX/MIRALAX) 17 GM/SCOOP powder Take by mouth.    . Probiotic Product (PROBIOTIC DAILY PO) Take 1 capsule by mouth daily.     . prochlorperazine (COMPAZINE) 10 MG tablet Take 1 tablet (10 mg total) by mouth every 6 (six) hours as needed for nausea or vomiting. 60 tablet 3  .  spironolactone (ALDACTONE) 25 MG tablet Take 25 mg by mouth daily.    . fluticasone (FLONASE) 50 MCG/ACT nasal spray      No current facility-administered medications for this encounter.    Physical Findings:  Unable to assess due to telephone follow-up visit format.   Lab Findings: Lab Results  Component Value Date   WBC 2.9 (L) 08/09/2019   HGB 13.2 08/09/2019   HCT 39.7 08/09/2019   MCV 93.6 08/09/2019   PLT 69 (L) 08/09/2019     Radiographic Findings: MR Brain W Wo Contrast  Result Date: 09/02/2019 CLINICAL DATA:  Brain/CNS neoplasm, surveillance. Follow-up treated metastatic disease. EXAM: MRI HEAD WITHOUT AND WITH CONTRAST TECHNIQUE: Multiplanar, multiecho pulse sequences of the brain and surrounding structures were obtained without and with intravenous contrast. CONTRAST:  57m MULTIHANCE GADOBENATE DIMEGLUMINE 529 MG/ML IV SOLN COMPARISON:  05/17/2019 MRI head. FINDINGS: Brain: Decreased conspicuity of 3 mm superior vermis enhancing lesion (12: 57, previously 4 mm). Intrinsically T1 hyperintense signal involving the bilateral dentate nuclei is unchanged. No new enhancing intracranial lesions. Background mild chronic microvascular ischemic changes. No midline shift, ventriculomegaly or extra-axial fluid collection. No diffusion-weighted signal abnormality. No intracranial hemorrhage. Vascular: Normal flow voids. Skull and upper cervical spine: Enhancing calvarial lesions involving the right frontal and right parietal bones (12:110, 112) are unchanged. Increased size of enhancing 1.6 x 0.9 cm anterior right temporal bone lesion (12:52, previously 1.3 x 0.8 cm when remeasured). This lesion demonstrates unchanged questionable involvement of the right temporal is muscle (12:53). No new enhancing osseous lesions. Sinuses/Orbits: Normal orbits. Clear paranasal sinuses. No mastoid effusion. Other: None. IMPRESSION: Increased size of right temporal calvarial metastasis likely involving the  temporalis muscle. Right frontal and parietal calvarial metastases are unchanged. Decreased conspicuity of superior vermian enhancing metastasis. No new metastases identified. Electronically Signed   By: CPrimitivo GauzeM.D.   On: 09/02/2019 15:31    Impression/Plan:  This visit was conducted via telephone to spare the patient unnecessary potential exposure in the healthcare setting during the current COVID-19 pandemic. 140 74yo woman with ER positive breast cancer with metastatic disease to the brain, bones and spine.  She appears to have recovered well from the effects of her recent brain radiation and is currently without complaints.  We discussed the plan to obtain a restaging MRI brain in approximately 4 months (patient preference) to continue to monitor for any disease progression.  Regarding her systemic disease, she will continue on her current chemotherapy regimen with routine follow-up for disease management under the care and direction of Dr. GLindi Adie   2. Spine metastases.  She has recovered well from the effects of [place] and L2 and is currently without complaints.  She continues to prefer to have this followed with systemic imaging under the care and direction of Dr. GLindi Adieand only repeat spine MRI if she becomes symptomatic.  We have previously discussed that CT imaging is not the ideal imaging modailty for evaluation of the  spine but certainly understand her concerns for limiting the number of imaging studies that she is subjected to on a routine basis.  She understands and accepts the associated risks of missing a very small spinal lesion on CT.  Her recent CT C/A/P imaging from 07/02/19 showed continued disease stability with no new or progressive interval findings. The previously treated metastases at T12 and L2 appear stable and there are no new osseous lesions identified. She is a most reliable patient and will let us know if she becomes symptomatic in the  spine so that we can more thoroughly evaluate with MRI if/when necessary.  She appears to have a good understanding of her overall disease and our recommendations and is in agreement with the stated plan.  She knows to call at anytime in the interim with any questions or concerns.  Given current concerns for patient exposure during the COVID-19 pandemic, this encounter was conducted via telephone. The patient was notified in advance and was offered a [place] meeting to allow for face to face communication but unfortunately reported that she did not have the appropriate resources/technology to support such a visit and instead preferred to proceed with telephone consult. The patient has given verbal consent for this type of encounter. The time spent during this encounter was 25 minutes with approximately 50% of that time spent in counseling and coordination of care. The attendants for this meeting include Tsuruko Murtha PA-C, and patient, Heather [name]. During the encounter, Relda Agosto PA-C was located at [facility] Radiation Oncology Department.  Patient, Heather [name] was located at home.   Nicholos Johns, PA-C

## 2019-09-09 NOTE — Progress Notes (Signed)
Pharmacist Chemotherapy Monitoring - Follow Up Assessment    I verify that I have reviewed each item in the below checklist:  . Regimen for the patient is scheduled for the appropriate day and plan matches scheduled date. Marland Kitchen Appropriate non-routine labs are ordered dependent on drug ordered. . If applicable, additional medications reviewed and ordered per protocol based on lifetime cumulative doses and/or treatment regimen.   Plan for follow-up and/or issues identified: Yes . I-vent associated with next due treatment: Yes . MD and/or nursing notified: Yes  Romualdo Bolk [facility] 09/09/2019 8:53 AM

## 2019-09-12 NOTE — Progress Notes (Signed)
Patient Care Team: [name], MD as PCP - General (Family Medicine)  DIAGNOSIS:    ICD-10-CM   1. Primary cancer of upper outer quadrant of right female breast ([place])  C50.411     SUMMARY OF ONCOLOGIC HISTORY: Oncology History  Primary cancer of upper outer quadrant of right female breast ([facility] [facility])  03/08/2012 Initial Diagnosis   Right breast invasive ductal carcinoma ER positive PR negative HER-2 positive Ki-67 44%; another breast mass biopsied in the anterior part of the breast which was also positive for malignancy that was HER-2 negative   03/14/2012 Cancer Staging   Staging form: Breast, AJCC 7th Edition - Pathologic: Stage IV (M1) - Signed by [name], NP on 06/22/2018   05/10/2012 Surgery   Right mastectomy and axillary lymph node dissection: Multifocal disease 5 cm, 1.7 cm, 1.6 cm, ER positive PR negative HER-2 positive Ki-67 44%, 3/7 lymph nodes positive   06/14/2012 - 06/12/2013 Chemotherapy   Adjuvant chemotherapy with Pierce 6 followed by Herceptin maintenance   10/25/2012 - 12/11/2012 Radiation Therapy   Adjuvant radiation therapy   02/07/2013 -  Anti-estrogen oral therapy   Letrozole 2.5 mg daily   02/14/2015 Imaging   MRI spine: Large destructive T5 lesion with severe pathologic compression fracture and extensive epidural tumor, severe spinal stenosis with moderate cord compression, tumor involvement of T4   03/18/2015 PET scan   Residual enhancing soft tissue adjacent to the spinal cord. No evidence of metastatic disease. Nonspecific uptake the left nipple   03/27/2015 - 03/30/2015 Hospital Admission   T4-6 decompression for spinal cord compression (lower extremity paralysis)   04/10/2015 -  Chemotherapy   Palliative treatment with Herceptin every 3 weeks with letrozole 2.5 mg daily   04/13/2015 - 05/19/2015 Radiation Therapy   Palliative radiation treatment to the spine   09/01/2015 Imaging   CT chest abdomen pelvis: Pathologic fracture with posterior  fusion at T5 no other evidence of metastatic disease in the chest abdomen pelvis   12/23/2015 Imaging   CT CAP: new nonspecific 0.6 cm lymph node was stated mediastinum needs follow-up CT, innumerable tiny groundglass pulmonary nodules throughout both lungs unchanged, patchy consolidation from radiation, T5 fracture, no mets   04/15/2016 PET scan   Rt para-spinal mass mildy hypermetabolic SUV 5.9, lytic cortical lesion 4.8 cm lesion left prox femur suv 8.8 favor osseus met disease   04/28/2016 - 05/12/2016 Radiation Therapy   Palliative XRT Left femur   05/24/2016 Procedure   Soft tissue mass biopsy back: Metastatic breast cancer ER 80%, PR 0%, Ki-67 50%, HER-2 positive ratio 2.25   06/09/2016 - 03/29/2017 Anti-estrogen oral therapy   Faslodex with Herceptin and Perjeta every 4 weeks   06/13/2016 - 06/15/2016 Hospital Admission   uncomplicated revision of previous thoracic instrumentation.   10/10/2016 PET scan   Interval development of new hypermetabolic 2.5 x 1.1 cm lesion posterior right eighth rib consistent with metastatic disease, stable right paraspinal lesion at T8, clustered soft tissue nodules in the posterior midline back near cervicothoracic junction smaller than before    11/03/2016 - 11/07/2016 Radiation Therapy   Palliative radiation to right eighth rib   11/04/2016 Imaging   Solitary contrast-enhancing lesion in the right cerebellum 6 x 2 mm concerning for metastatic lesion   11/24/2016 - 11/24/2016 Radiation Therapy   SRS to the brain lesion   04/18/2017 PET scan   New ill-defined rounded airspace opacity in posterior right lower lobe. Differential diagnosis includes malignancy and infectious/inflammatory process.Increased hypermetabolic lytic lesion  in T8 vertebral body, consistent with bone metastasis.  Increased hypermetabolic posterior upper chest wall subcutaneous nodules, highly suspicious metastatic disease. Further improvement in right posterior eighth rib metastasis.     05/04/2017 - 07/27/2017 Chemotherapy   Kadcyla every 3 weeks palliative chemotherapy stopped for progression   07/26/2017 Imaging   Interval increase in size of the multiple enhancing masses posteriorly overlying the spine, interval development of left axillary and mediastinal adenopathy, increase in the size of lytic lesions in T8-T9 and left ninth rib, increased groundglass opacities right lower lobe lung   07/27/2017 - 05/11/2018 Chemotherapy   Palbociclib + Faslodex + trastuzumab + Pertuzumab     04/03/2018 PET scan   PET CT scan showed progression of disease not only in the bones but also in the subcutaneous nodules.   05/15/2018 - 05/25/2018 Radiation Therapy   Palliative radiation to the back   06/01/2018 -  Chemotherapy   Enhertu (Transtuzumab Deruxtecan)    08/07/2018 - 08/07/2018 Radiation Therapy   T12 vertebral body SRS   Carcinoma of right breast, stage 4 ([facility])  02/14/2015 Initial Diagnosis   Breast cancer metastasized to bone, right ([name])   06/01/2018 -  Chemotherapy   The patient had palonosetron (ALOXI) injection 0.25 mg, 0.25 mg, Intravenous,  Once, 19 of 30 cycles Administration: 0.25 mg (06/01/2018), 0.25 mg (06/22/2018), 0.25 mg (08/24/2018), 0.25 mg (07/13/2018), 0.25 mg (08/03/2018), 0.25 mg (09/14/2018), 0.25 mg (10/05/2018), 0.25 mg (11/16/2018), 0.25 mg (10/26/2018), 0.25 mg (12/07/2018), 0.25 mg (12/28/2018), 0.25 mg (01/18/2019), 0.25 mg (03/15/2019), 0.25 mg (02/15/2019), 0.25 mg (04/12/2019), 0.25 mg (05/09/2019), 0.25 mg (06/07/2019), 0.25 mg (07/05/2019), 0.25 mg (08/09/2019) fam-trastuzumab deruxtecan-nxki (ENHERTU) 332 mg in dextrose 5 % 100 mL chemo infusion, 5.4 mg/kg = 332 mg, Intravenous,  Once, 19 of 30 cycles Dose modification: 4.4 mg/kg (original dose 5.4 mg/kg, Cycle 7, Reason: Provider Judgment), 3.2 mg/kg (original dose 5.4 mg/kg, Cycle 9, Reason: Dose not tolerated), 150 mg (original dose 5.4 mg/kg, Cycle 18, Reason: Dose not tolerated) Administration: 332 mg (06/01/2018),  332 mg (06/22/2018), 332 mg (07/13/2018), 332 mg (08/24/2018), 332 mg (08/03/2018), 332 mg (09/14/2018), 272 mg (10/05/2018), 200 mg (11/16/2018), 272 mg (10/26/2018), 200 mg (12/07/2018), 200 mg (12/28/2018), 200 mg (01/18/2019), 200 mg (03/15/2019), 200 mg (02/15/2019), 200 mg (04/12/2019), 200 mg (05/09/2019), 200 mg (06/07/2019), 150 mg (07/05/2019), 150 mg (08/09/2019)  for chemotherapy treatment.      CHIEF COMPLIANT: Follow-up of metastatic breast cancer onEnhertu  INTERVAL HISTORY: Heather [name] is a 74 y.o. with above-mentioned history of metastatic breast cancercurrently onchemotherapy with trastuzumab-Deruxtecan (Enhertu)every 4 weeks.She presents to the clinic todayfor treatment.  She is doing well today other than noting some soreness for the past couple of months in her right jaw.  She underwent MRI of the brain on 09/02/2019 That showed a 24m increase in the right temporal calvarial metastasis, otherwise unchanged.  She spoke with radiation oncology and their plan is to monitor this lesion for now.  Her last interval between Enhertu treatments was 5 weeks and that included a slight dosage decrease.  She notes she is feeling better with this adjustment, most notably not having any nausea following treatment as she had been.    ALLERGIES:  is allergic to other; acyclovir and related; chlorhexidine; zanaflex [tizanidine hcl]; codeine; keflex [cephalexin]; naprosyn [naproxen]; oxycodone; tramadol; and tussin [guaifenesin].  MEDICATIONS:  Current Outpatient Medications  Medication Sig Dispense Refill  . ALPRAZolam (XANAX) 0.25 MG tablet Take 1 tablet by mouth as needed.    .Marland Kitchen  Calcium Carbonate Antacid (TUMS PO) Take 2 tablets by mouth 2 (two) times daily as needed (acid reflux).    . Cholecalciferol (VITAMIN D) 2000 UNITS tablet Take 2,000 Units by mouth daily.    . cycloSPORINE (RESTASIS) 0.05 % ophthalmic emulsion 1 drop 2 (two) times daily.    . fluticasone (FLONASE) 50 MCG/ACT nasal spray     .  gabapentin (NEURONTIN) 600 MG tablet Take 1 tablet (600 mg total) by mouth as directed. Take 1 tablet in the AM and afternoon and 1 1/2 tabs at bedtime 315 tablet 2  . HYDROcodone-acetaminophen (NORCO/VICODIN) 5-325 MG tablet Take 1 tablet by mouth every 6 (six) hours as needed for moderate pain. 30 tablet 0  . lidocaine-prilocaine (EMLA) cream Apply to affected area once 30 g 3  . omeprazole (PRILOSEC) 40 MG capsule TAKE 1 CAPSULE BY MOUTH ONCE DAILY FOR 30 DAYS  5  . ondansetron (ZOFRAN) 8 MG tablet Take 1 tablet (8 mg total) by mouth every 8 (eight) hours as needed for nausea or vomiting. 20 tablet 0  . OVER THE COUNTER MEDICATION Place 1 drop into both eyes 2 (two) times daily as needed (dry eyes). Over the counter lubricating eye drops    . polyethylene glycol (MIRALAX / GLYCOLAX) packet Take 17 g by mouth daily as needed.    . polyethylene glycol powder (GLYCOLAX/MIRALAX) 17 GM/SCOOP powder Take by mouth.    . Probiotic Product (PROBIOTIC DAILY PO) Take 1 capsule by mouth daily.     . prochlorperazine (COMPAZINE) 10 MG tablet Take 1 tablet (10 mg total) by mouth every 6 (six) hours as needed for nausea or vomiting. 60 tablet 3  . spironolactone (ALDACTONE) 25 MG tablet Take 25 mg by mouth daily.     No current facility-administered medications for this visit.    PHYSICAL EXAMINATION: ECOG PERFORMANCE STATUS: 2 - Symptomatic, <50% confined to bed  Vitals:   09/13/19 0853  BP: (!) 164/71  Pulse: 73  Resp: 18  Temp: 98 F (36.7 C)  SpO2: 99%   Filed Weights   09/13/19 0853  Weight: 126 lb 12.8 oz (57.5 kg)    LABORATORY DATA:  I have reviewed the data as listed CMP Latest Ref Rng & Units 09/13/2019 08/09/2019 07/05/2019  Glucose 70 - 99 mg/dL 105(H) 100(H) 105(H)  BUN 8 - 23 mg/dL _0 Creatinine 0.44 - 1.00 mg/dL 0.75 0.74 0.75  Sodium 135 - 145 mmol/L 138 140 140  Potassium 3.5 - 5.1 mmol/L 3.9 4.0 3.9  Chloride 98 - 111 mmol/L 104 104 104  CO2 22 - 32 mmol/L _1 Calcium 8.9 - 10.3 mg/dL 10.3 9.6 9.8  Total Protein 6.5 - 8.1 g/dL 7.3 7.1 7.0  Total Bilirubin 0.3 - 1.2 mg/dL 0.7 0.9 0.9  Alkaline Phos 38 - 126 U/L 145(H) 116 125  AST 15 - 41 U/L 39 37 36  ALT 0 - 44 U/L _2 Lab Results  Component Value Date   WBC 2.3 (L) 09/13/2019   HGB 13.2 09/13/2019   HCT 39.7 09/13/2019   MCV 91.1 09/13/2019   PLT 66 (L) 09/13/2019   NEUTROABS 1.5 (L) 09/13/2019    ASSESSMENT & PLAN:  Primary cancer of upper outer quadrant of right female breast ([place]) Right breast invasive ductal carcinoma ER positive PR negative HER-2 positive Ki-67 44% multifocal disease 3/7 lymph nodes positive T2 N1 M0 stage IIB status post adjuvant chemotherapy with [facility]  followed by Herceptin maintenance, adjuvant radiation therapy and Letrozole. MRI 02/14/2015: T5 large destructive lesion with pathologic compression fracture and extensive epidural tumor, involvement of T4, excision metastatic carcinoma ER 60%, PR 0%, HER-2 positive ratio 2.71 average copy #7.45 03/27/2015: T4-6 decompression surgery for spinal cord compression PET-CT 12/10/17Rt para-spinal mass mildy hypermetabolic SUV 5.9, lytic cortical lesion 4.8 cm lesion left prox femur suv 8.8 favor osseus met disease Echo 04/12/2017 shows well preserved ejection fraction of 55-60%  Treatment summary: 1. Resumed anastrozole 05/21/2015, but it has caused significant hair loss so we switched her to exemestane 25 mg once daily 07/23/2015 2. Herceptin every 3 week started 04/10/2015 3. Bone metastases: Zometa every 3 months 4. Radiation therapy to the spine (Dr. [name]) 04/13/2015 to 05/19/2015 5. Radiation therapy to left femur 04/28/2016 to 05/12/2016  6. SRS to the brain lesion 11/24/2016 7.Kadcyla started 05/05/2017 discontinued 07/27/2017 8.Palbociclib +Faslodex +trastuzumab+ Pertuzumabstarted 08/10/2017, stopped 04/04/2018 9. Enhertu started 06/01/18 10SRS to brain January  2021 -------------------------------------------------------------------------------------------------------------------------- XRT to T123/04/2019 PET CT scan 10/03/2018:stable, improvement in subcutaneous mets   Current treatment:Cycle19day 1Enhertu (Fam-trastuzumab-Durextecan)currently being given every 5 weeks Echo7/2020shows EF of 55-60%. Completed every 6 months.   Toxicities: 1.Fatigue: Stable 2.Neuropathic pain: On gabapentin. 3.Hair thinning 4.Thrombocytopenia: Monitoring closely 5. Jaw pain: holding Zometa until dental clearance is obtained  Currently onevery 5-weekEnhertu, because of thrombocytopenia Zometa will now be every 15 weeks (currently on hold) Jaw pain: We decided to hold off on Zometa until cleared by dental.   CT CAP 07/03/2019: Stable bone metastases involving the right acromion, T12 and L2, stable compression fracture T5.  No new metastatic disease. Brain MRI 08/28/2019: Increase in the size of the right temporal calvarial metastasis likely involving the temporalis muscle  Scans will be done in August 2021. She will return in 5 weeks for labs, f/u, and her next treatment.     The patient has a good understanding of the overall plan. she agrees with it. she will call with any problems that may develop before the next visit here.  Total time spent: 30 mins including face to face time and time spent for planning, charting and coordination of care  [name], MD 09/13/2019  *Total Encounter Time as defined by the Centers for Medicare and Medicaid Services includes, in addition to the face-to-face time of a patient visit (documented in the note above) non-face-to-face time: obtaining and reviewing outside history, ordering and reviewing medications, tests or procedures, care coordination (communications with other health care professionals or caregivers) and documentation in the medical record.  I, Molly Dorshimer, am acting as scribe  for Dr. [name].  I have reviewed the above documentation for accuracy and completeness, and I agree with the above.

## 2019-09-13 ENCOUNTER — Inpatient Hospital Stay: Payer: Medicare Other

## 2019-09-13 ENCOUNTER — Other Ambulatory Visit: Payer: Self-pay

## 2019-09-13 ENCOUNTER — Inpatient Hospital Stay (HOSPITAL_BASED_OUTPATIENT_CLINIC_OR_DEPARTMENT_OTHER): Payer: Medicare Other | Admitting: Hematology and Oncology

## 2019-09-13 ENCOUNTER — Other Ambulatory Visit: Payer: Medicare Other

## 2019-09-13 ENCOUNTER — Inpatient Hospital Stay: Payer: Medicare Other | Attending: Hematology and Oncology

## 2019-09-13 VITALS — BP 115/68

## 2019-09-13 DIAGNOSIS — Z79811 Long term (current) use of aromatase inhibitors: Secondary | ICD-10-CM | POA: Insufficient documentation

## 2019-09-13 DIAGNOSIS — Z170 Estrogen receptor positive status [ER+]: Secondary | ICD-10-CM | POA: Insufficient documentation

## 2019-09-13 DIAGNOSIS — Z9221 Personal history of antineoplastic chemotherapy: Secondary | ICD-10-CM | POA: Diagnosis not present

## 2019-09-13 DIAGNOSIS — G9529 Other cord compression: Secondary | ICD-10-CM

## 2019-09-13 DIAGNOSIS — C50411 Malignant neoplasm of upper-outer quadrant of right female breast: Secondary | ICD-10-CM

## 2019-09-13 DIAGNOSIS — Z9011 Acquired absence of right breast and nipple: Secondary | ICD-10-CM | POA: Insufficient documentation

## 2019-09-13 DIAGNOSIS — Z923 Personal history of irradiation: Secondary | ICD-10-CM | POA: Insufficient documentation

## 2019-09-13 DIAGNOSIS — C7951 Secondary malignant neoplasm of bone: Secondary | ICD-10-CM | POA: Diagnosis not present

## 2019-09-13 DIAGNOSIS — C50911 Malignant neoplasm of unspecified site of right female breast: Secondary | ICD-10-CM

## 2019-09-13 DIAGNOSIS — Z95828 Presence of other vascular implants and grafts: Secondary | ICD-10-CM

## 2019-09-13 DIAGNOSIS — Z5112 Encounter for antineoplastic immunotherapy: Secondary | ICD-10-CM | POA: Insufficient documentation

## 2019-09-13 LAB — CMP (CANCER CENTER ONLY)
ALT: 26 U/L (ref 0–44)
AST: 39 U/L (ref 15–41)
Albumin: 3.6 g/dL (ref 3.5–5.0)
Alkaline Phosphatase: 145 U/L — ABNORMAL HIGH (ref 38–126)
Anion gap: 9 (ref 5–15)
BUN: 10 mg/dL (ref 8–23)
CO2: 25 mmol/L (ref 22–32)
Calcium: 10.3 mg/dL (ref 8.9–10.3)
Chloride: 104 mmol/L (ref 98–111)
Creatinine: 0.75 mg/dL (ref 0.44–1.00)
GFR, Est AFR Am: >60 mL/min (ref >60)
GFR, Estimated: >60 mL/min (ref >60)
Glucose, Bld: 105 mg/dL — ABNORMAL HIGH (ref 70–99)
Potassium: 3.9 mmol/L (ref 3.5–5.1)
Sodium: 138 mmol/L (ref 135–145)
Total Bilirubin: 0.7 mg/dL (ref 0.3–1.2)
Total Protein: 7.3 g/dL (ref 6.5–8.1)

## 2019-09-13 LAB — CBC WITH DIFFERENTIAL (CANCER CENTER ONLY)
Abs Immature Granulocytes: 0 10*3/uL (ref 0.00–0.07)
Basophils Absolute: 0 10*3/uL (ref 0.0–0.1)
Basophils Relative: 1 %
Eosinophils Absolute: 0.1 10*3/uL (ref 0.0–0.5)
Eosinophils Relative: 3 %
HCT: 39.7 % (ref 36.0–46.0)
Hemoglobin: 13.2 g/dL (ref 12.0–15.0)
Immature Granulocytes: 0 %
Lymphocytes Relative: 19 %
Lymphs Abs: 0.4 10*3/uL — ABNORMAL LOW (ref 0.7–4.0)
MCH: 30.3 pg (ref 26.0–34.0)
MCHC: 33.2 g/dL (ref 30.0–36.0)
MCV: 91.1 fL (ref 80.0–100.0)
Monocytes Absolute: 0.3 10*3/uL (ref 0.1–1.0)
Monocytes Relative: 12 %
Neutro Abs: 1.5 10*3/uL — ABNORMAL LOW (ref 1.7–7.7)
Neutrophils Relative %: 65 %
Platelet Count: 66 10*3/uL — ABNORMAL LOW (ref 150–400)
RBC: 4.36 MIL/uL (ref 3.87–5.11)
RDW: 14.3 % (ref 11.5–15.5)
WBC Count: 2.3 10*3/uL — ABNORMAL LOW (ref 4.0–10.5)
nRBC: 0 % (ref 0.0–0.2)

## 2019-09-13 MED ORDER — FAM-TRASTUZUMAB DERUXTECAN-NXKI CHEMO 100 MG IV SOLR
150.0000 mg | Freq: Once | INTRAVENOUS | Status: AC
  Administered 2019-09-13: 150 mg via INTRAVENOUS
  Filled 2019-09-13: qty 7.5

## 2019-09-13 MED ORDER — CYANOCOBALAMIN 1000 MCG/ML IJ SOLN
INTRAMUSCULAR | Status: AC
  Filled 2019-09-13: qty 1

## 2019-09-13 MED ORDER — PALONOSETRON HCL INJECTION 0.25 MG/5ML
0.2500 mg | Freq: Once | INTRAVENOUS | Status: AC
  Administered 2019-09-13: 10:00:00 0.25 mg via INTRAVENOUS

## 2019-09-13 MED ORDER — ACETAMINOPHEN 325 MG PO TABS
ORAL_TABLET | ORAL | Status: AC
  Filled 2019-09-13: qty 2

## 2019-09-13 MED ORDER — SODIUM CHLORIDE 0.9% FLUSH
10.0000 mL | Freq: Once | INTRAVENOUS | Status: AC
  Administered 2019-09-13: 10 mL
  Filled 2019-09-13: qty 10

## 2019-09-13 MED ORDER — PALONOSETRON HCL INJECTION 0.25 MG/5ML
INTRAVENOUS | Status: AC
  Filled 2019-09-13: qty 5

## 2019-09-13 MED ORDER — ACETAMINOPHEN 325 MG PO TABS
650.0000 mg | ORAL_TABLET | Freq: Once | ORAL | Status: AC
  Administered 2019-09-13: 650 mg via ORAL

## 2019-09-13 MED ORDER — CYANOCOBALAMIN 1000 MCG/ML IJ SOLN
1000.0000 ug | Freq: Once | INTRAMUSCULAR | Status: AC
  Administered 2019-09-13: 1000 ug via INTRAMUSCULAR

## 2019-09-13 MED ORDER — HEPARIN SOD (PORK) LOCK FLUSH 100 UNIT/ML IV SOLN
500.0000 [IU] | Freq: Once | INTRAVENOUS | Status: AC | PRN
  Administered 2019-09-13: 500 [IU]
  Filled 2019-09-13: qty 5

## 2019-09-13 MED ORDER — SODIUM CHLORIDE 0.9% FLUSH
10.0000 mL | INTRAVENOUS | Status: DC | PRN
  Administered 2019-09-13: 10 mL
  Filled 2019-09-13: qty 10

## 2019-09-13 MED ORDER — DEXTROSE 5 % IV SOLN
Freq: Once | INTRAVENOUS | Status: AC
  Filled 2019-09-13: qty 250

## 2019-09-13 NOTE — Patient Instructions (Signed)
[facility] Discharge Instructions for Patients Receiving Chemotherapy  Today you received the following chemotherapy agents: trastuzumab dertuxtecan.  To help prevent nausea and vomiting after your treatment, we encourage you to take your nausea medication as directed.   If you develop nausea and vomiting that is not controlled by your nausea medication, call the clinic.   BELOW ARE SYMPTOMS THAT SHOULD BE REPORTED IMMEDIATELY:  *FEVER GREATER THAN 100.5 F  *CHILLS WITH OR WITHOUT FEVER  NAUSEA AND VOMITING THAT IS NOT CONTROLLED WITH YOUR NAUSEA MEDICATION  *UNUSUAL SHORTNESS OF BREATH  *UNUSUAL BRUISING OR BLEEDING  TENDERNESS IN MOUTH AND THROAT WITH OR WITHOUT PRESENCE OF ULCERS  *URINARY PROBLEMS  *BOWEL PROBLEMS  UNUSUAL RASH Items with * indicate a potential emergency and should be followed up as soon as possible.  Feel free to call the clinic should you have any questions or concerns. The clinic phone number is [phone].  Please show the [place] at check-in to the Emergency Department and triage nurse.

## 2019-09-13 NOTE — Assessment & Plan Note (Addendum)
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 MRI 02/14/2015: T5 large destructive lesion with pathologic compression fracture and extensive epidural tumor, involvement of T4, excision metastatic carcinoma ER 60%, PR 0%, HER-2 positive ratio 2.71 average copy #7.45 03/27/2015: T4-6 decompression surgery for spinal cord compression PET-CT 12/10/17Rt para-spinal mass mildy hypermetabolic SUV 5.9, lytic cortical lesion 4.8 cm lesion left prox femur suv 8.8 favor osseus met disease Echo 04/12/2017 shows well preserved ejection fraction of 55-60%  Treatment summary: 1. Resumed anastrozole 05/21/2015, but it has caused significant hair loss so we switched her to exemestane 25 mg once daily 07/23/2015 2. Herceptin every 3 week started 04/10/2015 3. Bone metastases: Zometa every 3 months 4. Radiation therapy to the spine (Dr. [name]) 04/13/2015 to 05/19/2015 5. Radiation therapy to left femur 04/28/2016 to 05/12/2016  6. SRS to the brain lesion 11/24/2016 7.Kadcyla started 05/05/2017 discontinued 07/27/2017 8.Palbociclib +Faslodex +trastuzumab+ Pertuzumabstarted 08/10/2017, stopped 04/04/2018 9. Enhertu started 06/01/18 10SRS to brain January 2021 -------------------------------------------------------------------------------------------------------------------------- XRT to T123/04/2019 PET CT scan 10/03/2018:stable, improvement in subcutaneous mets   Current treatment:Cycle19day 1Enhertu (Fam-trastuzumab-Durextecan)currently being given every 5 weeks Echo7/2020shows EF of 55-60%. Completed every 6 months.   Toxicities: 1.Fatigue: Stable 2.Neuropathic pain: On gabapentin. 3.Hair thinning 4.Thrombocytopenia: Monitoring closely 5. Jaw pain: holding  Zometa until dental clearance is obtained  Currently onevery 5-weekEnhertu, because of thrombocytopenia Zometa will now be every 15 weeks (currently on hold)   CT CAP 07/03/2019: Stable bone metastases involving the right acromion, T12 and L2, stable compression fracture T5.  No new metastatic disease. Brain MRI 08/28/2019: Increase in the size of the right temporal calvarial metastasis likely involving the temporalis muscle  Scans will be done in August 2021. She will return in 5 weeks for labs, f/u, and her next treatment.

## 2019-09-13 NOTE — Progress Notes (Signed)
Per Dr [name], ok to treat with platelets of 66. Holding zometa for now.

## 2019-10-18 ENCOUNTER — Inpatient Hospital Stay: Payer: Medicare Other

## 2019-10-18 ENCOUNTER — Inpatient Hospital Stay: Payer: Medicare Other | Attending: Hematology and Oncology

## 2019-10-18 ENCOUNTER — Other Ambulatory Visit: Payer: Self-pay

## 2019-10-18 VITALS — BP 131/70 | HR 70 | Temp 98.0°F | Resp 18 | Wt 127.0 lb

## 2019-10-18 DIAGNOSIS — C7951 Secondary malignant neoplasm of bone: Secondary | ICD-10-CM

## 2019-10-18 DIAGNOSIS — Z79811 Long term (current) use of aromatase inhibitors: Secondary | ICD-10-CM | POA: Insufficient documentation

## 2019-10-18 DIAGNOSIS — Z9011 Acquired absence of right breast and nipple: Secondary | ICD-10-CM | POA: Diagnosis not present

## 2019-10-18 DIAGNOSIS — Z170 Estrogen receptor positive status [ER+]: Secondary | ICD-10-CM | POA: Diagnosis not present

## 2019-10-18 DIAGNOSIS — Z79899 Other long term (current) drug therapy: Secondary | ICD-10-CM | POA: Diagnosis not present

## 2019-10-18 DIAGNOSIS — G9529 Other cord compression: Secondary | ICD-10-CM

## 2019-10-18 DIAGNOSIS — Z923 Personal history of irradiation: Secondary | ICD-10-CM | POA: Diagnosis not present

## 2019-10-18 DIAGNOSIS — Z9221 Personal history of antineoplastic chemotherapy: Secondary | ICD-10-CM | POA: Insufficient documentation

## 2019-10-18 DIAGNOSIS — Z5112 Encounter for antineoplastic immunotherapy: Secondary | ICD-10-CM | POA: Diagnosis not present

## 2019-10-18 DIAGNOSIS — Z95828 Presence of other vascular implants and grafts: Secondary | ICD-10-CM

## 2019-10-18 DIAGNOSIS — C50411 Malignant neoplasm of upper-outer quadrant of right female breast: Secondary | ICD-10-CM | POA: Diagnosis not present

## 2019-10-18 DIAGNOSIS — C50911 Malignant neoplasm of unspecified site of right female breast: Secondary | ICD-10-CM

## 2019-10-18 LAB — CMP (CANCER CENTER ONLY)
ALT: 23 U/L (ref 0–44)
AST: 34 U/L (ref 15–41)
Albumin: 3.6 g/dL (ref 3.5–5.0)
Alkaline Phosphatase: 132 U/L — ABNORMAL HIGH (ref 38–126)
Anion gap: 11 (ref 5–15)
BUN: 13 mg/dL (ref 8–23)
CO2: 25 mmol/L (ref 22–32)
Calcium: 10.2 mg/dL (ref 8.9–10.3)
Chloride: 105 mmol/L (ref 98–111)
Creatinine: 0.76 mg/dL (ref 0.44–1.00)
GFR, Est AFR Am: >60 mL/min (ref >60)
GFR, Estimated: >60 mL/min (ref >60)
Glucose, Bld: 100 mg/dL — ABNORMAL HIGH (ref 70–99)
Potassium: 3.9 mmol/L (ref 3.5–5.1)
Sodium: 141 mmol/L (ref 135–145)
Total Bilirubin: 0.8 mg/dL (ref 0.3–1.2)
Total Protein: 7.2 g/dL (ref 6.5–8.1)

## 2019-10-18 LAB — CBC WITH DIFFERENTIAL (CANCER CENTER ONLY)
Abs Immature Granulocytes: 0 10*3/uL (ref 0.00–0.07)
Basophils Absolute: 0 10*3/uL (ref 0.0–0.1)
Basophils Relative: 1 %
Eosinophils Absolute: 0.1 10*3/uL (ref 0.0–0.5)
Eosinophils Relative: 3 %
HCT: 40.1 % (ref 36.0–46.0)
Hemoglobin: 13.2 g/dL (ref 12.0–15.0)
Immature Granulocytes: 0 %
Lymphocytes Relative: 15 %
Lymphs Abs: 0.4 10*3/uL — ABNORMAL LOW (ref 0.7–4.0)
MCH: 29.5 pg (ref 26.0–34.0)
MCHC: 32.9 g/dL (ref 30.0–36.0)
MCV: 89.5 fL (ref 80.0–100.0)
Monocytes Absolute: 0.3 10*3/uL (ref 0.1–1.0)
Monocytes Relative: 11 %
Neutro Abs: 1.8 10*3/uL (ref 1.7–7.7)
Neutrophils Relative %: 70 %
Platelet Count: 59 10*3/uL — ABNORMAL LOW (ref 150–400)
RBC: 4.48 MIL/uL (ref 3.87–5.11)
RDW: 14.3 % (ref 11.5–15.5)
WBC Count: 2.6 10*3/uL — ABNORMAL LOW (ref 4.0–10.5)
nRBC: 0 % (ref 0.0–0.2)

## 2019-10-18 MED ORDER — HEPARIN SOD (PORK) LOCK FLUSH 100 UNIT/ML IV SOLN
500.0000 [IU] | Freq: Once | INTRAVENOUS | Status: AC | PRN
  Administered 2019-10-18: 500 [IU]
  Filled 2019-10-18: qty 5

## 2019-10-18 MED ORDER — ACETAMINOPHEN 325 MG PO TABS
650.0000 mg | ORAL_TABLET | Freq: Once | ORAL | Status: AC
  Administered 2019-10-18: 650 mg via ORAL

## 2019-10-18 MED ORDER — PALONOSETRON HCL INJECTION 0.25 MG/5ML
0.2500 mg | Freq: Once | INTRAVENOUS | Status: AC
  Administered 2019-10-18: 0.25 mg via INTRAVENOUS

## 2019-10-18 MED ORDER — ACETAMINOPHEN 325 MG PO TABS
ORAL_TABLET | ORAL | Status: AC
  Filled 2019-10-18: qty 2

## 2019-10-18 MED ORDER — PALONOSETRON HCL INJECTION 0.25 MG/5ML
INTRAVENOUS | Status: AC
  Filled 2019-10-18: qty 5

## 2019-10-18 MED ORDER — SODIUM CHLORIDE 0.9% FLUSH
10.0000 mL | Freq: Once | INTRAVENOUS | Status: AC
  Administered 2019-10-18: 10 mL
  Filled 2019-10-18: qty 10

## 2019-10-18 MED ORDER — SODIUM CHLORIDE 0.9% FLUSH
10.0000 mL | INTRAVENOUS | Status: DC | PRN
  Administered 2019-10-18: 10 mL
  Filled 2019-10-18: qty 10

## 2019-10-18 MED ORDER — FAM-TRASTUZUMAB DERUXTECAN-NXKI CHEMO 100 MG IV SOLR
150.0000 mg | Freq: Once | INTRAVENOUS | Status: AC
  Administered 2019-10-18: 150 mg via INTRAVENOUS
  Filled 2019-10-18: qty 7.5

## 2019-10-18 MED ORDER — CYANOCOBALAMIN 1000 MCG/ML IJ SOLN
INTRAMUSCULAR | Status: AC
  Filled 2019-10-18: qty 1

## 2019-10-18 MED ORDER — CYANOCOBALAMIN 1000 MCG/ML IJ SOLN
1000.0000 ug | Freq: Once | INTRAMUSCULAR | Status: AC
  Administered 2019-10-18: 1000 ug via INTRAMUSCULAR

## 2019-10-18 MED ORDER — DEXTROSE 5 % IV SOLN
Freq: Once | INTRAVENOUS | Status: AC
  Filled 2019-10-18: qty 250

## 2019-10-18 NOTE — Progress Notes (Signed)
Per Dr [name], ok to treat with platelets of 59.

## 2019-10-18 NOTE — Patient Instructions (Signed)
[facility] Discharge Instructions for Patients Receiving Chemotherapy  Today you received the following chemotherapy agents: enhertu.  To help prevent nausea and vomiting after your treatment, we encourage you to take your nausea medication as directed.   If you develop nausea and vomiting that is not controlled by your nausea medication, call the clinic.   BELOW ARE SYMPTOMS THAT SHOULD BE REPORTED IMMEDIATELY:  *FEVER GREATER THAN 100.5 F  *CHILLS WITH OR WITHOUT FEVER  NAUSEA AND VOMITING THAT IS NOT CONTROLLED WITH YOUR NAUSEA MEDICATION  *UNUSUAL SHORTNESS OF BREATH  *UNUSUAL BRUISING OR BLEEDING  TENDERNESS IN MOUTH AND THROAT WITH OR WITHOUT PRESENCE OF ULCERS  *URINARY PROBLEMS  *BOWEL PROBLEMS  UNUSUAL RASH Items with * indicate a potential emergency and should be followed up as soon as possible.  Feel free to call the clinic should you have any questions or concerns. The clinic phone number is [phone].  Please show the [place] at check-in to the Emergency Department and triage nurse.

## 2019-11-08 ENCOUNTER — Other Ambulatory Visit: Payer: Self-pay | Admitting: Radiation Therapy

## 2019-11-08 DIAGNOSIS — C7931 Secondary malignant neoplasm of brain: Secondary | ICD-10-CM

## 2019-11-08 DIAGNOSIS — C7949 Secondary malignant neoplasm of other parts of nervous system: Secondary | ICD-10-CM

## 2019-11-08 NOTE — Progress Notes (Signed)
Port access for Brain MRI at [facility] entered for release by nurses at GI.   [place] R.T.(R)(T) Radiation Special Procedures Navigator

## 2019-11-13 ENCOUNTER — Encounter: Payer: Self-pay | Admitting: Radiation Therapy

## 2019-11-13 NOTE — Progress Notes (Addendum)
Pt had a Panorex done 11/12/2019 at the [facility] to evaluate her mandibular and TMJ pain.    [employer]  Dr. Tressie Ellis [address] [place], [place] [address]  Mailing Address: [address] [place], [place] [address]  Direct: [phone] Fax: [place] R.T.(R)(T) [facility]  Radiation Special Procedures Navigator Office: [phone]

## 2019-11-22 ENCOUNTER — Other Ambulatory Visit: Payer: Self-pay

## 2019-11-22 ENCOUNTER — Inpatient Hospital Stay: Payer: Medicare Other

## 2019-11-22 ENCOUNTER — Inpatient Hospital Stay: Payer: Medicare Other | Attending: Hematology and Oncology

## 2019-11-22 ENCOUNTER — Other Ambulatory Visit: Payer: Self-pay | Admitting: *Deleted

## 2019-11-22 ENCOUNTER — Telehealth: Payer: Self-pay | Admitting: *Deleted

## 2019-11-22 VITALS — BP 141/63 | HR 66 | Temp 98.3°F | Resp 16 | Ht 62.0 in | Wt 125.5 lb

## 2019-11-22 DIAGNOSIS — Z5112 Encounter for antineoplastic immunotherapy: Secondary | ICD-10-CM | POA: Insufficient documentation

## 2019-11-22 DIAGNOSIS — C50411 Malignant neoplasm of upper-outer quadrant of right female breast: Secondary | ICD-10-CM | POA: Insufficient documentation

## 2019-11-22 DIAGNOSIS — C50911 Malignant neoplasm of unspecified site of right female breast: Secondary | ICD-10-CM

## 2019-11-22 DIAGNOSIS — C7951 Secondary malignant neoplasm of bone: Secondary | ICD-10-CM

## 2019-11-22 DIAGNOSIS — Z170 Estrogen receptor positive status [ER+]: Secondary | ICD-10-CM | POA: Diagnosis not present

## 2019-11-22 DIAGNOSIS — Z79899 Other long term (current) drug therapy: Secondary | ICD-10-CM | POA: Insufficient documentation

## 2019-11-22 DIAGNOSIS — Z9221 Personal history of antineoplastic chemotherapy: Secondary | ICD-10-CM | POA: Insufficient documentation

## 2019-11-22 DIAGNOSIS — Z923 Personal history of irradiation: Secondary | ICD-10-CM | POA: Insufficient documentation

## 2019-11-22 DIAGNOSIS — Z79811 Long term (current) use of aromatase inhibitors: Secondary | ICD-10-CM | POA: Insufficient documentation

## 2019-11-22 DIAGNOSIS — Z9011 Acquired absence of right breast and nipple: Secondary | ICD-10-CM | POA: Insufficient documentation

## 2019-11-22 DIAGNOSIS — G9529 Other cord compression: Secondary | ICD-10-CM

## 2019-11-22 DIAGNOSIS — Z95828 Presence of other vascular implants and grafts: Secondary | ICD-10-CM

## 2019-11-22 LAB — CMP (CANCER CENTER ONLY)
ALT: 35 U/L (ref 0–44)
AST: 44 U/L — ABNORMAL HIGH (ref 15–41)
Albumin: 3.7 g/dL (ref 3.5–5.0)
Alkaline Phosphatase: 142 U/L — ABNORMAL HIGH (ref 38–126)
Anion gap: 10 (ref 5–15)
BUN: 9 mg/dL (ref 8–23)
CO2: 25 mmol/L (ref 22–32)
Calcium: 10.5 mg/dL — ABNORMAL HIGH (ref 8.9–10.3)
Chloride: 104 mmol/L (ref 98–111)
Creatinine: 0.75 mg/dL (ref 0.44–1.00)
GFR, Est AFR Am: >60 mL/min (ref >60)
GFR, Estimated: >60 mL/min (ref >60)
Glucose, Bld: 102 mg/dL — ABNORMAL HIGH (ref 70–99)
Potassium: 3.8 mmol/L (ref 3.5–5.1)
Sodium: 139 mmol/L (ref 135–145)
Total Bilirubin: 1.3 mg/dL — ABNORMAL HIGH (ref 0.3–1.2)
Total Protein: 7.3 g/dL (ref 6.5–8.1)

## 2019-11-22 LAB — CBC WITH DIFFERENTIAL (CANCER CENTER ONLY)
Abs Immature Granulocytes: 0.01 10*3/uL (ref 0.00–0.07)
Basophils Absolute: 0 10*3/uL (ref 0.0–0.1)
Basophils Relative: 1 %
Eosinophils Absolute: 0.1 10*3/uL (ref 0.0–0.5)
Eosinophils Relative: 3 %
HCT: 40.3 % (ref 36.0–46.0)
Hemoglobin: 13.5 g/dL (ref 12.0–15.0)
Immature Granulocytes: 0 %
Lymphocytes Relative: 14 %
Lymphs Abs: 0.4 10*3/uL — ABNORMAL LOW (ref 0.7–4.0)
MCH: 30.4 pg (ref 26.0–34.0)
MCHC: 33.5 g/dL (ref 30.0–36.0)
MCV: 90.8 fL (ref 80.0–100.0)
Monocytes Absolute: 0.3 10*3/uL (ref 0.1–1.0)
Monocytes Relative: 10 %
Neutro Abs: 2 10*3/uL (ref 1.7–7.7)
Neutrophils Relative %: 72 %
Platelet Count: 81 10*3/uL — ABNORMAL LOW (ref 150–400)
RBC: 4.44 MIL/uL (ref 3.87–5.11)
RDW: 14.6 % (ref 11.5–15.5)
WBC Count: 2.8 10*3/uL — ABNORMAL LOW (ref 4.0–10.5)
nRBC: 0 % (ref 0.0–0.2)

## 2019-11-22 MED ORDER — SODIUM CHLORIDE 0.9% FLUSH
10.0000 mL | INTRAVENOUS | Status: DC | PRN
  Filled 2019-11-22: qty 10

## 2019-11-22 MED ORDER — HEPARIN SOD (PORK) LOCK FLUSH 100 UNIT/ML IV SOLN
500.0000 [IU] | Freq: Once | INTRAVENOUS | Status: DC | PRN
  Filled 2019-11-22: qty 5

## 2019-11-22 MED ORDER — FAM-TRASTUZUMAB DERUXTECAN-NXKI CHEMO 100 MG IV SOLR
150.0000 mg | Freq: Once | INTRAVENOUS | Status: AC
  Administered 2019-11-22: 150 mg via INTRAVENOUS
  Filled 2019-11-22: qty 7.5

## 2019-11-22 MED ORDER — CYANOCOBALAMIN 1000 MCG/ML IJ SOLN
INTRAMUSCULAR | Status: AC
  Filled 2019-11-22: qty 1

## 2019-11-22 MED ORDER — PALONOSETRON HCL INJECTION 0.25 MG/5ML
INTRAVENOUS | Status: AC
  Filled 2019-11-22: qty 5

## 2019-11-22 MED ORDER — ACETAMINOPHEN 325 MG PO TABS
650.0000 mg | ORAL_TABLET | Freq: Once | ORAL | Status: AC
  Administered 2019-11-22: 650 mg via ORAL

## 2019-11-22 MED ORDER — SODIUM CHLORIDE 0.9% FLUSH
10.0000 mL | Freq: Once | INTRAVENOUS | Status: AC
  Administered 2019-11-22: 10 mL
  Filled 2019-11-22: qty 10

## 2019-11-22 MED ORDER — ACETAMINOPHEN 325 MG PO TABS
ORAL_TABLET | ORAL | Status: AC
  Filled 2019-11-22: qty 2

## 2019-11-22 MED ORDER — DEXTROSE 5 % IV SOLN
Freq: Once | INTRAVENOUS | Status: AC
  Filled 2019-11-22: qty 250

## 2019-11-22 MED ORDER — CYANOCOBALAMIN 1000 MCG/ML IJ SOLN
1000.0000 ug | Freq: Once | INTRAMUSCULAR | Status: AC
  Administered 2019-11-22: 1000 ug via INTRAMUSCULAR

## 2019-11-22 MED ORDER — PALONOSETRON HCL INJECTION 0.25 MG/5ML
0.2500 mg | Freq: Once | INTRAVENOUS | Status: AC
  Administered 2019-11-22: 0.25 mg via INTRAVENOUS

## 2019-11-22 NOTE — Telephone Encounter (Signed)
Per Dr.Gudena, OK to treat with PLT 81

## 2019-12-06 DIAGNOSIS — E785 Hyperlipidemia, unspecified: Secondary | ICD-10-CM | POA: Diagnosis not present

## 2019-12-06 DIAGNOSIS — C50411 Malignant neoplasm of upper-outer quadrant of right female breast: Secondary | ICD-10-CM | POA: Diagnosis not present

## 2019-12-06 DIAGNOSIS — I10 Essential (primary) hypertension: Secondary | ICD-10-CM | POA: Diagnosis not present

## 2019-12-09 DIAGNOSIS — E785 Hyperlipidemia, unspecified: Secondary | ICD-10-CM | POA: Diagnosis not present

## 2019-12-09 DIAGNOSIS — C50411 Malignant neoplasm of upper-outer quadrant of right female breast: Secondary | ICD-10-CM | POA: Diagnosis not present

## 2019-12-09 DIAGNOSIS — I10 Essential (primary) hypertension: Secondary | ICD-10-CM | POA: Diagnosis not present

## 2019-12-12 ENCOUNTER — Telehealth: Payer: Self-pay | Admitting: *Deleted

## 2019-12-12 DIAGNOSIS — G9529 Other cord compression: Secondary | ICD-10-CM

## 2019-12-12 DIAGNOSIS — C7951 Secondary malignant neoplasm of bone: Secondary | ICD-10-CM

## 2019-12-12 DIAGNOSIS — C50911 Malignant neoplasm of unspecified site of right female breast: Secondary | ICD-10-CM

## 2019-12-12 NOTE — Telephone Encounter (Signed)
Please schedule her for CT chest abdomen pelvis and bone scan She needs treatment appointments for her with Enhertu with labs through the port and MD visits every other treatment

## 2019-12-12 NOTE — Telephone Encounter (Signed)
Pt called to follow up on chemo appts- has not been scheduled. Please advise.   Also wants to know if she is due for a CT.   States she is having indigestion type pain, hurts when bends over- is similar to the pain she had when the cancer caused the compression. Is having worse nerve pain and back pain. Left breast around to back.

## 2019-12-13 DIAGNOSIS — K219 Gastro-esophageal reflux disease without esophagitis: Secondary | ICD-10-CM | POA: Diagnosis not present

## 2019-12-13 DIAGNOSIS — R079 Chest pain, unspecified: Secondary | ICD-10-CM | POA: Diagnosis not present

## 2019-12-13 NOTE — Telephone Encounter (Signed)
Notified of message below.  Message to scheduler. Orders placed

## 2019-12-19 ENCOUNTER — Telehealth: Payer: Self-pay | Admitting: Hematology and Oncology

## 2019-12-19 NOTE — Telephone Encounter (Signed)
Scheduled apt per 8/6 sch msg - pt is aware of 8/20 appt.

## 2019-12-23 ENCOUNTER — Other Ambulatory Visit: Payer: Self-pay | Admitting: *Deleted

## 2019-12-23 DIAGNOSIS — Z79899 Other long term (current) drug therapy: Secondary | ICD-10-CM

## 2019-12-23 DIAGNOSIS — Z5181 Encounter for therapeutic drug level monitoring: Secondary | ICD-10-CM

## 2019-12-24 ENCOUNTER — Telehealth: Payer: Self-pay | Admitting: *Deleted

## 2019-12-24 NOTE — Telephone Encounter (Signed)
RN placed call to Dr. Clayborne Dana office and LVM for nurse regarding pt needing to be scheduled for echocardiogram.

## 2019-12-25 ENCOUNTER — Telehealth: Payer: Self-pay | Admitting: *Deleted

## 2019-12-25 NOTE — Telephone Encounter (Signed)
Called pt cardiologist Dr.Benismhon to follow up on scheduling ECHO. Message was left. Awaiting callback

## 2019-12-26 ENCOUNTER — Telehealth (HOSPITAL_COMMUNITY): Payer: Self-pay

## 2019-12-26 NOTE — Telephone Encounter (Signed)
Received a triage vm from [facility] at the [facility] requesting a callback about scheduling an echo appointment for patient. Tried calling kelsey back but no answer,unable to leave vm, due to vm being full. Will try again later.    CB#[phone]

## 2019-12-26 NOTE — Progress Notes (Signed)
Patient Care Team: [name], MD as PCP - General (Family Medicine)  DIAGNOSIS:    ICD-10-CM   1. Spinal cord compression due to malignant neoplasm metastatic to spine (HCC)  G95.29    C79.51   2. Primary cancer of upper outer quadrant of right female breast ([place])  C50.411     SUMMARY OF ONCOLOGIC HISTORY: Oncology History  Primary cancer of upper outer quadrant of right female breast ([place])  03/08/2012 Initial Diagnosis   Right breast invasive ductal carcinoma ER positive PR negative HER-2 positive Ki-67 44%; another breast mass biopsied in the anterior part of the breast which was also positive for malignancy that was HER-2 negative   03/14/2012 Cancer Staging   Staging form: Breast, AJCC 7th Edition - Pathologic: Stage IV (M1) - Signed by [name], NP on 06/22/2018   05/10/2012 Surgery   Right mastectomy and axillary lymph node dissection: Multifocal disease 5 cm, 1.7 cm, 1.6 cm, ER positive PR negative HER-2 positive Ki-67 44%, 3/7 lymph nodes positive   06/14/2012 - 06/12/2013 Chemotherapy   Adjuvant chemotherapy with [place] 6 followed by Herceptin maintenance   10/25/2012 - 12/11/2012 Radiation Therapy   Adjuvant radiation therapy   02/07/2013 -  Anti-estrogen oral therapy   Letrozole 2.5 mg daily   02/14/2015 Imaging   MRI spine: Large destructive T5 lesion with severe pathologic compression fracture and extensive epidural tumor, severe spinal stenosis with moderate cord compression, tumor involvement of T4   03/18/2015 PET scan   Residual enhancing soft tissue adjacent to the spinal cord. No evidence of metastatic disease. Nonspecific uptake the left nipple   03/27/2015 - 03/30/2015 Hospital Admission   T4-6 decompression for spinal cord compression (lower extremity paralysis)   04/10/2015 -  Chemotherapy   Palliative treatment with Herceptin every 3 weeks with letrozole 2.5 mg daily   04/13/2015 - 05/19/2015 Radiation Therapy   Palliative radiation treatment  to the spine   09/01/2015 Imaging   CT chest abdomen pelvis: Pathologic fracture with posterior fusion at T5 no other evidence of metastatic disease in the chest abdomen pelvis   12/23/2015 Imaging   CT CAP: new nonspecific 0.6 cm lymph node was stated mediastinum needs follow-up CT, innumerable tiny groundglass pulmonary nodules throughout both lungs unchanged, patchy consolidation from radiation, T5 fracture, no mets   04/15/2016 PET scan   Rt para-spinal mass mildy hypermetabolic SUV 5.9, lytic cortical lesion 4.8 cm lesion left prox femur suv 8.8 favor osseus met disease   04/28/2016 - 05/12/2016 Radiation Therapy   Palliative XRT Left femur   05/24/2016 Procedure   Soft tissue mass biopsy back: Metastatic breast cancer ER 80%, PR 0%, Ki-67 50%, HER-2 positive ratio 2.25   06/09/2016 - 03/29/2017 Anti-estrogen oral therapy   Faslodex with Herceptin and Perjeta every 4 weeks   06/13/2016 - 06/15/2016 Hospital Admission   uncomplicated revision of previous thoracic instrumentation.   10/10/2016 PET scan   Interval development of new hypermetabolic 2.5 x 1.1 cm lesion posterior right eighth rib consistent with metastatic disease, stable right paraspinal lesion at T8, clustered soft tissue nodules in the posterior midline back near cervicothoracic junction smaller than before    11/03/2016 - 11/07/2016 Radiation Therapy   Palliative radiation to right eighth rib   11/04/2016 Imaging   Solitary contrast-enhancing lesion in the right cerebellum 6 x 2 mm concerning for metastatic lesion   11/24/2016 - 11/24/2016 Radiation Therapy   SRS to the brain lesion   04/18/2017 PET scan  New ill-defined rounded airspace opacity in posterior right lower lobe. Differential diagnosis includes malignancy and infectious/inflammatory process.Increased hypermetabolic lytic lesion in T8 vertebral body, consistent with bone metastasis.  Increased hypermetabolic posterior upper chest wall subcutaneous nodules, highly  suspicious metastatic disease. Further improvement in right posterior eighth rib metastasis.   05/04/2017 - 07/27/2017 Chemotherapy   Kadcyla every 3 weeks palliative chemotherapy stopped for progression   07/26/2017 Imaging   Interval increase in size of the multiple enhancing masses posteriorly overlying the spine, interval development of left axillary and mediastinal adenopathy, increase in the size of lytic lesions in T8-T9 and left ninth rib, increased groundglass opacities right lower lobe lung   07/27/2017 - 05/11/2018 Chemotherapy   Palbociclib + Faslodex + trastuzumab + Pertuzumab     04/03/2018 PET scan   PET CT scan showed progression of disease not only in the bones but also in the subcutaneous nodules.   05/15/2018 - 05/25/2018 Radiation Therapy   Palliative radiation to the back   06/01/2018 -  Chemotherapy   Enhertu (Transtuzumab Deruxtecan)    08/07/2018 - 08/07/2018 Radiation Therapy   T12 vertebral body SRS   Carcinoma of right breast, stage 4 (HCC)  02/14/2015 Initial Diagnosis   Breast cancer metastasized to bone, right ([place])   06/01/2018 -  Chemotherapy   The patient had palonosetron (ALOXI) injection 0.25 mg, 0.25 mg, Intravenous,  Once, 22 of 30 cycles Administration: 0.25 mg (06/01/2018), 0.25 mg (06/22/2018), 0.25 mg (08/24/2018), 0.25 mg (07/13/2018), 0.25 mg (08/03/2018), 0.25 mg (09/14/2018), 0.25 mg (10/05/2018), 0.25 mg (11/16/2018), 0.25 mg (10/26/2018), 0.25 mg (12/07/2018), 0.25 mg (12/28/2018), 0.25 mg (01/18/2019), 0.25 mg (03/15/2019), 0.25 mg (02/15/2019), 0.25 mg (04/12/2019), 0.25 mg (05/09/2019), 0.25 mg (06/07/2019), 0.25 mg (07/05/2019), 0.25 mg (08/09/2019), 0.25 mg (09/13/2019), 0.25 mg (10/18/2019), 0.25 mg (11/22/2019) fam-trastuzumab deruxtecan-nxki (ENHERTU) 332 mg in dextrose 5 % 100 mL chemo infusion, 5.4 mg/kg = 332 mg, Intravenous,  Once, 22 of 30 cycles Dose modification: 4.4 mg/kg (original dose 5.4 mg/kg, Cycle 7, Reason: Provider Judgment), 3.2 mg/kg (original dose  5.4 mg/kg, Cycle 9, Reason: Dose not tolerated), 150 mg (original dose 5.4 mg/kg, Cycle 18, Reason: Dose not tolerated) Administration: 332 mg (06/01/2018), 332 mg (06/22/2018), 332 mg (07/13/2018), 332 mg (08/24/2018), 332 mg (08/03/2018), 332 mg (09/14/2018), 272 mg (10/05/2018), 200 mg (11/16/2018), 272 mg (10/26/2018), 200 mg (12/07/2018), 200 mg (12/28/2018), 200 mg (01/18/2019), 200 mg (03/15/2019), 200 mg (02/15/2019), 200 mg (04/12/2019), 200 mg (05/09/2019), 200 mg (06/07/2019), 150 mg (07/05/2019), 150 mg (08/09/2019), 150 mg (09/13/2019), 150 mg (10/18/2019), 150 mg (11/22/2019)  for chemotherapy treatment.      CHIEF COMPLIANT: Follow-up of metastatic breast cancer onEnhertu  INTERVAL HISTORY: Heather [name] is a 74 y.o. with above-mentioned history of metastatic breast cancercurrently onchemotherapy with trastuzumab-Deruxtecan (Enhertu)every 5 weeks.She presents to the clinic todayfor treatment.   Her major complaints are related to slowly progressively worsening neuropathy.  She is also having lots of aches and pains in multiple bones in her body.  She stopped Zometa in December, 2020 because of jaw pain.  Her jaw pain has improved although it still there to a certain degree.  She is slightly wobbly and she has recurrent headaches.  She has appointments for CT scans bone scans and a brain MRI coming up.  She is scheduled for echocardiogram at Dr. Clayborne Dana office.  ALLERGIES:  is allergic to other, acyclovir and related, chlorhexidine, zanaflex [tizanidine hcl], codeine, keflex [cephalexin], naprosyn [naproxen], oxycodone, tramadol, and tussin [guaifenesin].  MEDICATIONS:  Current Outpatient Medications  Medication Sig Dispense Refill  . ALPRAZolam (XANAX) 0.25 MG tablet Take 1 tablet by mouth as needed.    . Cholecalciferol (VITAMIN D) 2000 UNITS tablet Take 2,000 Units by mouth daily.    . cycloSPORINE (RESTASIS) 0.05 % ophthalmic emulsion 1 drop 2 (two) times daily.    . fluticasone (FLONASE) 50  MCG/ACT nasal spray     . gabapentin (NEURONTIN) 600 MG tablet Take 1 tablet (600 mg total) by mouth as directed. Take 1 tablet in the AM and afternoon and 1 1/2 tabs at bedtime 315 tablet 2  . HYDROcodone-acetaminophen (NORCO/VICODIN) 5-325 MG tablet Take 1 tablet by mouth every 6 (six) hours as needed for moderate pain. 30 tablet 0  . lidocaine-prilocaine (EMLA) cream Apply to affected area once 30 g 3  . omeprazole (PRILOSEC) 40 MG capsule TAKE 1 CAPSULE BY MOUTH ONCE DAILY FOR 30 DAYS  5  . ondansetron (ZOFRAN) 8 MG tablet Take 1 tablet (8 mg total) by mouth every 8 (eight) hours as needed for nausea or vomiting. 20 tablet 0  . OVER THE COUNTER MEDICATION Place 1 drop into both eyes 2 (two) times daily as needed (dry eyes). Over the counter lubricating eye drops    . polyethylene glycol (MIRALAX / GLYCOLAX) packet Take 17 g by mouth daily as needed.    . polyethylene glycol powder (GLYCOLAX/MIRALAX) 17 GM/SCOOP powder Take by mouth.    . Probiotic Product (PROBIOTIC DAILY PO) Take 1 capsule by mouth daily.     . prochlorperazine (COMPAZINE) 10 MG tablet Take 1 tablet (10 mg total) by mouth every 6 (six) hours as needed for nausea or vomiting. 60 tablet 3  . spironolactone (ALDACTONE) 25 MG tablet Take 25 mg by mouth daily.     No current facility-administered medications for this visit.    PHYSICAL EXAMINATION: ECOG PERFORMANCE STATUS: 1 - Symptomatic but completely ambulatory  Vitals:   12/27/19 1202  BP: (!) 149/75  Pulse: 73  Resp: 18  Temp: 98 F (36.7 C)  SpO2: 98%   Filed Weights   12/27/19 1202  Weight: 127 lb 12.8 oz (58 kg)    LABORATORY DATA:  I have reviewed the data as listed CMP Latest Ref Rng & Units 12/27/2019 11/22/2019 10/18/2019  Glucose 70 - 99 mg/dL 97 102(H) 100(H)  BUN 8 - 23 mg/dL '17 9 13  ' Creatinine 0.44 - 1.00 mg/dL 0.83 0.75 0.76  Sodium 135 - 145 mmol/L 136 139 141  Potassium 3.5 - 5.1 mmol/L 4.0 3.8 3.9  Chloride 98 - 111 mmol/L 101 104 105  CO2  22 - 32 mmol/L '25 25 25  ' Calcium 8.9 - 10.3 mg/dL 10.9(H) 10.5(H) 10.2  Total Protein 6.5 - 8.1 g/dL 7.6 7.3 7.2  Total Bilirubin 0.3 - 1.2 mg/dL 1.0 1.3(H) 0.8  Alkaline Phos 38 - 126 U/L 136(H) 142(H) 132(H)  AST 15 - 41 U/L 43(H) 44(H) 34  ALT 0 - 44 U/L 29 35 23    Lab Results  Component Value Date   WBC 4.2 12/27/2019   HGB 13.8 12/27/2019   HCT 40.9 12/27/2019   MCV 89.9 12/27/2019   PLT 84 (L) 12/27/2019   NEUTROABS 3.4 12/27/2019    ASSESSMENT & PLAN:  Primary cancer of upper outer quadrant of right female breast ([place]) Right breast invasive ductal carcinoma ER positive PR negative HER-2 positive Ki-67 44% multifocal disease 3/7 lymph nodes positive T2 N1 M0 stage IIB status post adjuvant chemotherapy with [facility]  followed by Herceptin maintenance, adjuvant radiation therapy and Letrozole. MRI 02/14/2015: T5 large destructive lesion with pathologic compression fracture and extensive epidural tumor, involvement of T4, excision metastatic carcinoma ER 60%, PR 0%, HER-2 positive ratio 2.71 average copy #7.45 03/27/2015: T4-6 decompression surgery for spinal cord compression PET-CT 12/10/17Rt para-spinal mass mildy hypermetabolic SUV 5.9, lytic cortical lesion 4.8 cm lesion left prox femur suv 8.8 favor osseus met disease Echo 04/12/2017 shows well preserved ejection fraction of 55-60%  Treatment summary: 1. Resumed anastrozole 05/21/2015, but it has caused significant hair loss so we switched her to exemestane 25 mg once daily 07/23/2015 2. Herceptin every 3 week started 04/10/2015 3. Bone metastases: Zometa every 3 months 4. Radiation therapy to the spine (Dr. [name]) 04/13/2015 to 05/19/2015 5. Radiation therapy to left femur 04/28/2016 to 05/12/2016  6. SRS to the brain lesion 11/24/2016 7.Kadcyla started 05/05/2017 discontinued 07/27/2017 8.Palbociclib +Faslodex +trastuzumab+ Pertuzumabstarted 08/10/2017, stopped 04/04/2018 9. Enhertu started 06/01/18 10SRS to brain  January 2021 -------------------------------------------------------------------------------------------------------------------------- XRT to T123/04/2019 PET CT scan 10/03/2018:stable, improvement in subcutaneous mets   Current treatment:Cycle19day 1Enhertu (Fam-trastuzumab-Durextecan)currently being given every 5 weeks Echo7/2020shows EF of 55-60%. Completed every 6 months.   Toxicities: 1.Fatigue: Stable 2.Neuropathic pain: On gabapentin. 3.Hair thinning 4.Thrombocytopenia: Monitoring closely 5. Jaw pain: holding Zometa until dental clearance is obtained  Currently onevery 5-weekEnhertu,because of thrombocytopenia Zometa will now be every 15 weeks (currently on hold) plan to resume Zometa in December 2021.  Jaw pain: Zometa on hold because of the jaw pain. Hypercalcemia: I discussed with her about stopping Tums and that we need to consider bringing back Zometa for the hypercalcemia as well.   CT CAP 07/03/2019: Stable bone metastases involving the right acromion, T12 and L2, stable compression fracture T5. No new metastatic disease. Brain MRI 08/28/2019: Increase in the size of the right temporal calvarial metastasis likely involving the temporalis muscle  Scans will be done in 01/01/2020  CT chest abdomen pelvis, bone scan and brain MRI are scheduled for next week. I will do a telephone visit after the CT scans to review the results. If the results are excellent then we will move her Enhertu treatment to every 6 weeks.   No orders of the defined types were placed in this encounter.  The patient has a good understanding of the overall plan. she agrees with it. she will call with any problems that may develop before the next visit here.  Total time spent: 30 mins including face to face time and time spent for planning, charting and coordination of care  [name], MD 12/27/2019  I, Cloyde Reams Dorshimer, am acting as scribe for Dr. [name].  I  have reviewed the above documentation for accuracy and completeness, and I agree with the above.

## 2019-12-27 ENCOUNTER — Other Ambulatory Visit: Payer: Medicare Other

## 2019-12-27 ENCOUNTER — Inpatient Hospital Stay: Payer: Medicare Other

## 2019-12-27 ENCOUNTER — Inpatient Hospital Stay (HOSPITAL_BASED_OUTPATIENT_CLINIC_OR_DEPARTMENT_OTHER): Payer: Medicare Other | Admitting: Hematology and Oncology

## 2019-12-27 ENCOUNTER — Other Ambulatory Visit: Payer: Self-pay | Admitting: Hematology and Oncology

## 2019-12-27 ENCOUNTER — Other Ambulatory Visit: Payer: Self-pay

## 2019-12-27 ENCOUNTER — Ambulatory Visit: Payer: Medicare Other | Admitting: Hematology and Oncology

## 2019-12-27 ENCOUNTER — Inpatient Hospital Stay: Payer: Medicare Other | Attending: Hematology and Oncology

## 2019-12-27 ENCOUNTER — Other Ambulatory Visit (HOSPITAL_COMMUNITY): Payer: Self-pay | Admitting: *Deleted

## 2019-12-27 VITALS — BP 149/75 | HR 73 | Temp 98.0°F | Resp 18 | Ht 62.0 in | Wt 127.8 lb

## 2019-12-27 DIAGNOSIS — Z923 Personal history of irradiation: Secondary | ICD-10-CM | POA: Diagnosis not present

## 2019-12-27 DIAGNOSIS — C7951 Secondary malignant neoplasm of bone: Secondary | ICD-10-CM

## 2019-12-27 DIAGNOSIS — Z5112 Encounter for antineoplastic immunotherapy: Secondary | ICD-10-CM | POA: Diagnosis not present

## 2019-12-27 DIAGNOSIS — C50411 Malignant neoplasm of upper-outer quadrant of right female breast: Secondary | ICD-10-CM | POA: Insufficient documentation

## 2019-12-27 DIAGNOSIS — Z9011 Acquired absence of right breast and nipple: Secondary | ICD-10-CM | POA: Diagnosis not present

## 2019-12-27 DIAGNOSIS — C7931 Secondary malignant neoplasm of brain: Secondary | ICD-10-CM | POA: Diagnosis not present

## 2019-12-27 DIAGNOSIS — Z79811 Long term (current) use of aromatase inhibitors: Secondary | ICD-10-CM | POA: Diagnosis not present

## 2019-12-27 DIAGNOSIS — G9529 Other cord compression: Secondary | ICD-10-CM | POA: Diagnosis not present

## 2019-12-27 DIAGNOSIS — Z170 Estrogen receptor positive status [ER+]: Secondary | ICD-10-CM | POA: Insufficient documentation

## 2019-12-27 DIAGNOSIS — Z9221 Personal history of antineoplastic chemotherapy: Secondary | ICD-10-CM | POA: Insufficient documentation

## 2019-12-27 DIAGNOSIS — Z95828 Presence of other vascular implants and grafts: Secondary | ICD-10-CM

## 2019-12-27 DIAGNOSIS — C50911 Malignant neoplasm of unspecified site of right female breast: Secondary | ICD-10-CM

## 2019-12-27 LAB — CMP (CANCER CENTER ONLY)
ALT: 29 U/L (ref 0–44)
AST: 43 U/L — ABNORMAL HIGH (ref 15–41)
Albumin: 3.9 g/dL (ref 3.5–5.0)
Alkaline Phosphatase: 136 U/L — ABNORMAL HIGH (ref 38–126)
Anion gap: 10 (ref 5–15)
BUN: 17 mg/dL (ref 8–23)
CO2: 25 mmol/L (ref 22–32)
Calcium: 10.9 mg/dL — ABNORMAL HIGH (ref 8.9–10.3)
Chloride: 101 mmol/L (ref 98–111)
Creatinine: 0.83 mg/dL (ref 0.44–1.00)
GFR, Est AFR Am: >60 mL/min (ref >60)
GFR, Estimated: >60 mL/min (ref >60)
Glucose, Bld: 97 mg/dL (ref 70–99)
Potassium: 4 mmol/L (ref 3.5–5.1)
Sodium: 136 mmol/L (ref 135–145)
Total Bilirubin: 1 mg/dL (ref 0.3–1.2)
Total Protein: 7.6 g/dL (ref 6.5–8.1)

## 2019-12-27 LAB — CBC WITH DIFFERENTIAL (CANCER CENTER ONLY)
Abs Immature Granulocytes: 0.01 10*3/uL (ref 0.00–0.07)
Basophils Absolute: 0 10*3/uL (ref 0.0–0.1)
Basophils Relative: 1 %
Eosinophils Absolute: 0.1 10*3/uL (ref 0.0–0.5)
Eosinophils Relative: 2 %
HCT: 40.9 % (ref 36.0–46.0)
Hemoglobin: 13.8 g/dL (ref 12.0–15.0)
Immature Granulocytes: 0 %
Lymphocytes Relative: 10 %
Lymphs Abs: 0.4 10*3/uL — ABNORMAL LOW (ref 0.7–4.0)
MCH: 30.3 pg (ref 26.0–34.0)
MCHC: 33.7 g/dL (ref 30.0–36.0)
MCV: 89.9 fL (ref 80.0–100.0)
Monocytes Absolute: 0.3 10*3/uL (ref 0.1–1.0)
Monocytes Relative: 7 %
Neutro Abs: 3.4 10*3/uL (ref 1.7–7.7)
Neutrophils Relative %: 80 %
Platelet Count: 84 10*3/uL — ABNORMAL LOW (ref 150–400)
RBC: 4.55 MIL/uL (ref 3.87–5.11)
RDW: 14.7 % (ref 11.5–15.5)
WBC Count: 4.2 10*3/uL (ref 4.0–10.5)
nRBC: 0 % (ref 0.0–0.2)

## 2019-12-27 MED ORDER — PALONOSETRON HCL INJECTION 0.25 MG/5ML
0.2500 mg | Freq: Once | INTRAVENOUS | Status: AC
  Administered 2019-12-27: 0.25 mg via INTRAVENOUS

## 2019-12-27 MED ORDER — SODIUM CHLORIDE 0.9% FLUSH
10.0000 mL | Freq: Once | INTRAVENOUS | Status: AC
  Administered 2019-12-27: 10 mL
  Filled 2019-12-27: qty 10

## 2019-12-27 MED ORDER — FAM-TRASTUZUMAB DERUXTECAN-NXKI CHEMO 100 MG IV SOLR
150.0000 mg | Freq: Once | INTRAVENOUS | Status: AC
  Administered 2019-12-27: 150 mg via INTRAVENOUS
  Filled 2019-12-27: qty 7.5

## 2019-12-27 MED ORDER — ACETAMINOPHEN 325 MG PO TABS
650.0000 mg | ORAL_TABLET | Freq: Once | ORAL | Status: AC
  Administered 2019-12-27: 650 mg via ORAL

## 2019-12-27 MED ORDER — HEPARIN SOD (PORK) LOCK FLUSH 100 UNIT/ML IV SOLN
500.0000 [IU] | Freq: Once | INTRAVENOUS | Status: AC | PRN
  Administered 2019-12-27: 500 [IU]
  Filled 2019-12-27: qty 5

## 2019-12-27 MED ORDER — CYANOCOBALAMIN 1000 MCG/ML IJ SOLN
INTRAMUSCULAR | Status: AC
  Filled 2019-12-27: qty 1

## 2019-12-27 MED ORDER — DEXTROSE 5 % IV SOLN
Freq: Once | INTRAVENOUS | Status: AC
  Filled 2019-12-27: qty 250

## 2019-12-27 MED ORDER — ACETAMINOPHEN 325 MG PO TABS
ORAL_TABLET | ORAL | Status: AC
  Filled 2019-12-27: qty 2

## 2019-12-27 MED ORDER — PALONOSETRON HCL INJECTION 0.25 MG/5ML
INTRAVENOUS | Status: AC
  Filled 2019-12-27: qty 5

## 2019-12-27 MED ORDER — CYANOCOBALAMIN 1000 MCG/ML IJ SOLN
1000.0000 ug | Freq: Once | INTRAMUSCULAR | Status: AC
  Administered 2019-12-27: 1000 ug via INTRAMUSCULAR

## 2019-12-27 MED ORDER — SODIUM CHLORIDE 0.9% FLUSH
10.0000 mL | INTRAVENOUS | Status: DC | PRN
  Administered 2019-12-27: 10 mL
  Filled 2019-12-27: qty 10

## 2019-12-27 NOTE — Patient Instructions (Signed)

## 2019-12-27 NOTE — Progress Notes (Signed)
Per [name], Sanders to treat with today's PLT count.

## 2019-12-27 NOTE — Patient Instructions (Signed)
[facility] Discharge Instructions for Patients Receiving Chemotherapy  Today you received the following chemotherapy agents: Enhertu  To help prevent nausea and vomiting after your treatment, we encourage you to take your nausea medication as directed.   If you develop nausea and vomiting that is not controlled by your nausea medication, call the clinic.   BELOW ARE SYMPTOMS THAT SHOULD BE REPORTED IMMEDIATELY:  *FEVER GREATER THAN 100.5 F  *CHILLS WITH OR WITHOUT FEVER  NAUSEA AND VOMITING THAT IS NOT CONTROLLED WITH YOUR NAUSEA MEDICATION  *UNUSUAL SHORTNESS OF BREATH  *UNUSUAL BRUISING OR BLEEDING  TENDERNESS IN MOUTH AND THROAT WITH OR WITHOUT PRESENCE OF ULCERS  *URINARY PROBLEMS  *BOWEL PROBLEMS  UNUSUAL RASH Items with * indicate a potential emergency and should be followed up as soon as possible.  Feel free to call the clinic should you have any questions or concerns. The clinic phone number is [phone].  Please show the CHEMO ALERT CARD at check-in to the Emergency Department and triage nurse.   

## 2019-12-27 NOTE — Assessment & Plan Note (Signed)
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 MRI 02/14/2015: T5 large destructive lesion with pathologic compression fracture and extensive epidural tumor, involvement of T4, excision metastatic carcinoma ER 60%, PR 0%, HER-2 positive ratio 2.71 average copy #7.45 03/27/2015: T4-6 decompression surgery for spinal cord compression PET-CT 12/10/17Rt para-spinal mass mildy hypermetabolic SUV 5.9, lytic cortical lesion 4.8 cm lesion left prox femur suv 8.8 favor osseus met disease Echo 04/12/2017 shows well preserved ejection fraction of 55-60%  Treatment summary: 1. Resumed anastrozole 05/21/2015, but it has caused significant hair loss so we switched her to exemestane 25 mg once daily 07/23/2015 2. Herceptin every 3 week started 04/10/2015 3. Bone metastases: Zometa every 3 months 4. Radiation therapy to the spine (Dr. [name]) 04/13/2015 to 05/19/2015 5. Radiation therapy to left femur 04/28/2016 to 05/12/2016  6. SRS to the brain lesion 11/24/2016 7.Kadcyla started 05/05/2017 discontinued 07/27/2017 8.Palbociclib +Faslodex +trastuzumab+ Pertuzumabstarted 08/10/2017, stopped 04/04/2018 9. Enhertu started 06/01/18 10SRS to brain January 2021 -------------------------------------------------------------------------------------------------------------------------- XRT to T123/04/2019 PET CT scan 10/03/2018:stable, improvement in subcutaneous mets   Current treatment:Cycle19day 1Enhertu (Fam-trastuzumab-Durextecan)currently being given every 5 weeks Echo7/2020shows EF of 55-60%. Completed every 6 months.   Toxicities: 1.Fatigue: Stable 2.Neuropathic pain: On gabapentin. 3.Hair thinning 4.Thrombocytopenia: Monitoring closely 5. Jaw pain: holding  Zometa until dental clearance is obtained  Currently onevery 5-weekEnhertu,because of thrombocytopenia Zometa will now be every 15 weeks (currently on hold) Jaw pain: We decided to hold off on Zometa until cleared by dental.   CT CAP 07/03/2019: Stable bone metastases involving the right acromion, T12 and L2, stable compression fracture T5. No new metastatic disease. Brain MRI 08/28/2019: Increase in the size of the right temporal calvarial metastasis likely involving the temporalis muscle  Scans will be done in 01/01/2020 She will return in5weeks for labs, f/u, and her next treatment.

## 2019-12-30 ENCOUNTER — Ambulatory Visit (HOSPITAL_BASED_OUTPATIENT_CLINIC_OR_DEPARTMENT_OTHER)
Admission: RE | Admit: 2019-12-30 | Discharge: 2019-12-30 | Disposition: A | Discharge status: Home / Self Care | Payer: Medicare Other | Source: Ambulatory Visit | Attending: Internal Medicine | Admitting: Internal Medicine

## 2019-12-30 ENCOUNTER — Ambulatory Visit (HOSPITAL_COMMUNITY)
Admission: RE | Admit: 2019-12-30 | Discharge: 2019-12-30 | Disposition: A | Discharge status: Home / Self Care | Payer: Medicare Other | Source: Ambulatory Visit | Attending: Internal Medicine | Admitting: Internal Medicine

## 2019-12-30 ENCOUNTER — Other Ambulatory Visit: Payer: Self-pay

## 2019-12-30 ENCOUNTER — Encounter (HOSPITAL_COMMUNITY): Payer: Self-pay | Admitting: *Deleted

## 2019-12-30 DIAGNOSIS — C50911 Malignant neoplasm of unspecified site of right female breast: Secondary | ICD-10-CM | POA: Diagnosis not present

## 2019-12-30 DIAGNOSIS — Z923 Personal history of irradiation: Secondary | ICD-10-CM | POA: Diagnosis not present

## 2019-12-30 DIAGNOSIS — G629 Polyneuropathy, unspecified: Secondary | ICD-10-CM | POA: Insufficient documentation

## 2019-12-30 DIAGNOSIS — Z79899 Other long term (current) drug therapy: Secondary | ICD-10-CM | POA: Diagnosis not present

## 2019-12-30 DIAGNOSIS — Z881 Allergy status to other antibiotic agents status: Secondary | ICD-10-CM | POA: Insufficient documentation

## 2019-12-30 DIAGNOSIS — Z9221 Personal history of antineoplastic chemotherapy: Secondary | ICD-10-CM | POA: Diagnosis not present

## 2019-12-30 DIAGNOSIS — I10 Essential (primary) hypertension: Secondary | ICD-10-CM

## 2019-12-30 DIAGNOSIS — F419 Anxiety disorder, unspecified: Secondary | ICD-10-CM | POA: Diagnosis not present

## 2019-12-30 DIAGNOSIS — Z885 Allergy status to narcotic agent status: Secondary | ICD-10-CM | POA: Diagnosis not present

## 2019-12-30 DIAGNOSIS — K219 Gastro-esophageal reflux disease without esophagitis: Secondary | ICD-10-CM | POA: Insufficient documentation

## 2019-12-30 DIAGNOSIS — C7951 Secondary malignant neoplasm of bone: Secondary | ICD-10-CM | POA: Diagnosis not present

## 2019-12-30 DIAGNOSIS — Z85828 Personal history of other malignant neoplasm of skin: Secondary | ICD-10-CM | POA: Insufficient documentation

## 2019-12-30 DIAGNOSIS — Z170 Estrogen receptor positive status [ER+]: Secondary | ICD-10-CM | POA: Insufficient documentation

## 2019-12-30 DIAGNOSIS — E785 Hyperlipidemia, unspecified: Secondary | ICD-10-CM | POA: Insufficient documentation

## 2019-12-30 DIAGNOSIS — D649 Anemia, unspecified: Secondary | ICD-10-CM | POA: Diagnosis not present

## 2019-12-30 DIAGNOSIS — Z87440 Personal history of urinary (tract) infections: Secondary | ICD-10-CM | POA: Insufficient documentation

## 2019-12-30 DIAGNOSIS — Z888 Allergy status to other drugs, medicaments and biological substances status: Secondary | ICD-10-CM | POA: Diagnosis not present

## 2019-12-30 DIAGNOSIS — Z886 Allergy status to analgesic agent status: Secondary | ICD-10-CM | POA: Insufficient documentation

## 2019-12-30 DIAGNOSIS — Z9011 Acquired absence of right breast and nipple: Secondary | ICD-10-CM | POA: Diagnosis not present

## 2019-12-30 DIAGNOSIS — G4700 Insomnia, unspecified: Secondary | ICD-10-CM | POA: Diagnosis not present

## 2019-12-30 LAB — ECHOCARDIOGRAM COMPLETE
Area-P 1/2: 3.39 cm2
S' Lateral: 2.7 cm

## 2019-12-30 NOTE — Addendum Note (Signed)
Encounter addended by: Scarlette Calico, RN on: 12/30/2019 5:02 PM  Actions taken: Order list changed, Diagnosis association updated, Clinical Note Signed

## 2019-12-30 NOTE — Progress Notes (Signed)
  Echocardiogram 2D Echocardiogram has been performed.  Heather [name] M 12/30/2019, 12:13 PM

## 2019-12-30 NOTE — Patient Instructions (Signed)
Please call our office in January to schedule your follow up appointment and echocardiogram.

## 2019-12-30 NOTE — Progress Notes (Signed)
Heart Failure TeleHealth Note  Due to national recommendations of social distancing due to [place] 19, Audio/video telehealth visit is felt to be most appropriate for this patient at this time.  See MyChart message from today for patient consent regarding telehealth for [facility]. The patient was identified personally using two identifiers.   Date:  12/30/2019   ID:  Heather [name], DOB 10/30/1945, MRN [id number]  Location: Home  Provider location: [facility] Type of Visit: Established patient  PCP:  [name], MD  Cardiologist:  No primary care provider on file. Primary HF: Kevyn Wengert  Chief Complaint: Breast Cancer   History of Present Illness:  Heather [name] is a 74 year old female with invasive ductal carcinoma in R breast that is ER positive and HER-2/neu positive,  S/P R mastectomy and lymph node dissection 05/10/12.    She has been treated for HTN in the past but stopped taking the medications due to elevated renal function. Intolerant Ace-I due to cough.    She received a total of 6 cycles of Taxotere/carboplatin starting on 06/14/2012 and finishing in May. XRT finished in August 2014. Now receiving Herceptin every 3 weeks to end at the end of February 2015.    She finished initial Herceptin 2015 but unfortunately then developed pathological vertebral fracture with cord compression and paralysis. Now back on Herceptin since 12/16.  Found to have metastatic lesion to left femur. Underwent XRT.   Subsequently found to have a met to her 8th rib. Perjeta added to Herceptin starting in March 2018.    PET scan 03/2018 with progression of mets. Palbociclib +Faslodex +trastuzumab+ Pertuzumabstarted 08/10/2017, stopped 04/04/2018. Now on Enhertu (Fam-trastuzumab-Durextecan) started 06/01/18.   She presents via [employer], [employer] (consulting) for a telehealth visit today. Remains on Enhertu. Struggling with severe neuropathy and bony pain. Very weak.  Was having rib pain and got an ECG with PCP which was normal. Started PPI which was helpful. Mild edema. No SOB, orthopnea or PND. Checking BP at home typically 120/60s.   Echo 12/30/19 60-65% Personally reviewed  Echo 1/21 EF 60-65% Echo 12/19 EF 55-60% GLS - 16.8%  Echo 6/19 EF 55-60%  GLS -23.5%    [name] denies symptoms worrisome for COVID 19.   Past Medical History:  Diagnosis Date  . Anemia    hx of  . Anxiety    r/t updated surgery  . Breast cancer ([place])    right  . Cataract    immature-not sure of which eye  . Complication of anesthesia    pt states she is sensitive to meds  . Dizziness    has been going on for 60months;medical MD aware  . Genetic testing 07/11/2016   Test Results: Normal; No pathogenic mutations detected  Genes Analyzed: 43 genes on [employer]'s Common Cancers panel (APC, ATM, AXIN2, BARD1, BMPR1A, BRCA1, BRCA2, BRIP1, CDH1, CDKN2A, CHEK2, DICER1, EPCAM, GREM1, HOXB13, KIT, MEN1, MLH1, MSH2, MSH6, MUTYH, NBN, NF1, PALB2, PDGFRA, PMS2, POLD1, POLE, PTEN, RAD50, RAD51C, RAD51D, SDHA, SDHB, SDHC, SDHD, SMAD4, SMARCA4, STK11, TP53, TSC1, TSC2, VHL).  Marland Kitchen GERD (gastroesophageal reflux disease)    Tums prn  . H. pylori infection   . H/O hiatal hernia   . Hemorrhoids   . History of UTI   . Hx of radiation therapy 10/25/12- 12/11/12   right chest wall/regional lymph nodes 5040 cGy, 28 sessions, right chest wall boost 1000 cGy 1 session  . Hx: UTI (urinary tract infection)   . Hyperlipidemia  but not on meds;diet and exercise controlled  . Hypertension    recently started Aldactone   . Insomnia    takes Melatoniin daily  . Metastasis to spinal column (HCC)    T5, Left  Femur cancer- radiation   . Neuropathy    "from back surgery"  . PONV (postoperative nausea and vomiting)    pt experienced hair loss, confusion and combative- 02/15/15  . Right knee pain   . Right shoulder pain   . Skin cancer    squamous, Nodule to back - "same cancer as breast."  .  Urinary frequency    d/t taking Aldactone   Past Surgical History:  Procedure Laterality Date  . COLONOSCOPY    . cyst removed from left breast  1970  . DECOMPRESSIVE LUMBAR LAMINECTOMY LEVEL 4 N/A 02/15/2015   Procedure: DECOMPRESSION T5 AND T3-T7 STABALIZATION;  Surgeon: Consuella Lose, MD;  Location: [facility];  Service: Neurosurgery;  Laterality: N/A;  . ESOPHAGOGASTRODUODENOSCOPY    . LAMINECTOMY N/A 03/27/2015   Procedure: Thoracic Four-Thoracic Six Laminectomy for tumor;  Surgeon: Consuella Lose, MD;  Location: [facility];  Service: Neurosurgery;  Laterality: N/A;  T4-T6 Laminectomy  . left wrist surgery   2004   with plate  . MASTECTOMY MODIFIED RADICAL  05/10/2012   Procedure: MASTECTOMY MODIFIED RADICAL;  Surgeon: Merrie Roof, MD;  Location: [facility];  Service: General;  Laterality: Right;  RIGHT MODIFIED RADICAL MASTECTOMY  . MASTECTOMY, RADICAL Right   . MINOR BREAST BIOPSY Left 05/16/2016   Procedure: EXCISION OF BACK NODULE;  Surgeon: Autumn Messing III, MD;  Location: [facility];  Service: General;  Laterality: Left;  . Nodule  back  05/17/2015  . PORTACATH PLACEMENT  05/10/2012   Procedure: INSERTION PORT-A-CATH;  Surgeon: Merrie Roof, MD;  Location: [facility];  Service: General;  Laterality: Left;     Current Outpatient Medications  Medication Sig Dispense Refill  . ALPRAZolam (XANAX) 0.25 MG tablet Take 1 tablet by mouth as needed.    . Cholecalciferol (VITAMIN D) 2000 UNITS tablet Take 2,000 Units by mouth daily.    . cycloSPORINE (RESTASIS) 0.05 % ophthalmic emulsion 1 drop 2 (two) times daily.    . fluticasone (FLONASE) 50 MCG/ACT nasal spray     . gabapentin (NEURONTIN) 600 MG tablet Take 1 tablet (600 mg total) by mouth as directed. Take 1 tablet in the AM and afternoon and 1 1/2 tabs at bedtime 315 tablet 2  . HYDROcodone-acetaminophen (NORCO/VICODIN) 5-325 MG tablet Take 1 tablet by mouth every 6 (six) hours as needed for moderate pain. 30  tablet 0  . lidocaine-prilocaine (EMLA) cream Apply to affected area once 30 g 3  . omeprazole (PRILOSEC) 40 MG capsule TAKE 1 CAPSULE BY MOUTH ONCE DAILY FOR 30 DAYS  5  . ondansetron (ZOFRAN) 8 MG tablet Take 1 tablet (8 mg total) by mouth every 8 (eight) hours as needed for nausea or vomiting. 20 tablet 0  . OVER THE COUNTER MEDICATION Place 1 drop into both eyes 2 (two) times daily as needed (dry eyes). Over the counter lubricating eye drops    . polyethylene glycol (MIRALAX / GLYCOLAX) packet Take 17 g by mouth daily as needed.    . polyethylene glycol powder (GLYCOLAX/MIRALAX) 17 GM/SCOOP powder Take by mouth.    . Probiotic Product (PROBIOTIC DAILY PO) Take 1 capsule by mouth daily.     . prochlorperazine (COMPAZINE) 10 MG tablet Take 1 tablet (10  mg total) by mouth every 6 (six) hours as needed for nausea or vomiting. 60 tablet 3  . spironolactone (ALDACTONE) 25 MG tablet Take 25 mg by mouth daily.     No current facility-administered medications for this encounter.    Allergies:   Other, Acyclovir and related, Chlorhexidine, Zanaflex [tizanidine hcl], Codeine, Keflex [cephalexin], Naprosyn [naproxen], Oxycodone, Tramadol, and Tussin [guaifenesin]   Social History:  The patient  reports that she has never smoked. She has never used smokeless tobacco. She reports that she does not drink alcohol and does not use drugs.   Family History:  The patient's family history includes Diabetes Mellitus II in her father; Leukemia (age of onset: 70) in her mother; Prostate cancer (age of onset: 2) in her brother; Prostate cancer (age of onset: 53) in her brother; Stomach cancer (age of onset: 6) in her maternal grandmother; Stroke in her father.   ROS:  Please see the history of present illness.   All other systems are personally reviewed and negative.   Exam:  (Video/Tele Health Call; Exam is subjective and or/visual.) General:  Speaks in full sentences. No resp difficulty. Lungs: Normal  respiratory effort with conversation.  Abdomen: Non-distended per patient report Extremities: Pt denies edema. Neuro: Alert & oriented x 3.   Recent Labs: 12/27/2019: ALT 29; BUN 17; Creatinine 0.83; Hemoglobin 13.8; Platelet Count 84; Potassium 4.0; Sodium 136  Personally reviewed   Wt Readings from Last 3 Encounters:  12/27/19 58 kg (127 lb 12.8 oz)  11/22/19 56.9 kg (125 lb 8 oz)  10/18/19 57.6 kg (127 lb)      ASSESSMENT AND PLAN:  1. R breast CA with metastatic disease  - initial Herceptin completed in 2015. - Palliative Herceptin start 12/16. Added Perjeta in 3/18 - Started Andria Frames early 2019  - PET scan 03/2018 with progression of mets. Now on Enhertu every 4 weeks - most recent scan on 8/20 stable - Pending repeat scans soon  - Echo 12/30/19 60-65% Personally reviewed - I reviewed echos personally. EF and Doppler parameters stable. No HF on exam. Continue Enhertu. Repeat echo in 6 months.    2. HTN:  - BP stable   COVID screen The patient does not have any symptoms that suggest any further testing/ screening at this time.  Social distancing reinforced today.  Recommended follow-up:  As above  Relevant cardiac medications were reviewed at length with the patient today.   The patient does not have concerns regarding their medications at this time.   The following changes were made today:  As above  Today, I have spent 14 minutes with the patient with telehealth technology discussing the above issues .    Signed, Glori Bickers, MD  12/30/2019 4:07 PM  [facility] [address] [facility] [place] [id number] [phone] (office) [phone] (fax)

## 2019-12-31 ENCOUNTER — Other Ambulatory Visit: Payer: Self-pay | Admitting: Hematology and Oncology

## 2019-12-31 ENCOUNTER — Telehealth: Payer: Self-pay | Admitting: Hematology and Oncology

## 2019-12-31 NOTE — Telephone Encounter (Signed)
Scheduled appts per 8/20 los. Pt confirmed appt date and time.

## 2020-01-01 ENCOUNTER — Encounter (HOSPITAL_COMMUNITY)
Admission: RE | Admit: 2020-01-01 | Discharge: 2020-01-01 | Disposition: A | Discharge status: Home / Self Care | Payer: Medicare Other | Source: Ambulatory Visit | Attending: Hematology and Oncology | Admitting: Hematology and Oncology

## 2020-01-01 ENCOUNTER — Other Ambulatory Visit (HOSPITAL_COMMUNITY): Payer: Medicare Other

## 2020-01-01 ENCOUNTER — Telehealth: Payer: Self-pay | Admitting: Hematology and Oncology

## 2020-01-01 ENCOUNTER — Encounter (HOSPITAL_COMMUNITY): Payer: Self-pay

## 2020-01-01 ENCOUNTER — Other Ambulatory Visit: Payer: Self-pay

## 2020-01-01 ENCOUNTER — Ambulatory Visit (HOSPITAL_COMMUNITY)
Admission: RE | Admit: 2020-01-01 | Discharge: 2020-01-01 | Disposition: A | Discharge status: Home / Self Care | Payer: Medicare Other | Source: Ambulatory Visit | Attending: Hematology and Oncology | Admitting: Hematology and Oncology

## 2020-01-01 ENCOUNTER — Other Ambulatory Visit: Payer: Self-pay | Admitting: Hematology and Oncology

## 2020-01-01 DIAGNOSIS — Z981 Arthrodesis status: Secondary | ICD-10-CM | POA: Diagnosis not present

## 2020-01-01 DIAGNOSIS — Z9889 Other specified postprocedural states: Secondary | ICD-10-CM | POA: Diagnosis not present

## 2020-01-01 DIAGNOSIS — C50919 Malignant neoplasm of unspecified site of unspecified female breast: Secondary | ICD-10-CM | POA: Diagnosis not present

## 2020-01-01 DIAGNOSIS — K8020 Calculus of gallbladder without cholecystitis without obstruction: Secondary | ICD-10-CM | POA: Diagnosis not present

## 2020-01-01 DIAGNOSIS — C50911 Malignant neoplasm of unspecified site of right female breast: Secondary | ICD-10-CM | POA: Diagnosis not present

## 2020-01-01 DIAGNOSIS — C7951 Secondary malignant neoplasm of bone: Secondary | ICD-10-CM

## 2020-01-01 DIAGNOSIS — I700 Atherosclerosis of aorta: Secondary | ICD-10-CM | POA: Diagnosis not present

## 2020-01-01 DIAGNOSIS — G9529 Other cord compression: Secondary | ICD-10-CM | POA: Diagnosis not present

## 2020-01-01 DIAGNOSIS — K449 Diaphragmatic hernia without obstruction or gangrene: Secondary | ICD-10-CM | POA: Diagnosis not present

## 2020-01-01 MED ORDER — SODIUM CHLORIDE (PF) 0.9 % IJ SOLN
INTRAMUSCULAR | Status: AC
  Filled 2020-01-01: qty 50

## 2020-01-01 MED ORDER — HEPARIN SOD (PORK) LOCK FLUSH 100 UNIT/ML IV SOLN
INTRAVENOUS | Status: AC
  Filled 2020-01-01: qty 5

## 2020-01-01 MED ORDER — TECHNETIUM TC 99M MEDRONATE IV KIT
21.9000 | PACK | Freq: Once | INTRAVENOUS | Status: AC | PRN
  Administered 2020-01-01: 21.9 via INTRAVENOUS

## 2020-01-01 MED ORDER — HEPARIN SOD (PORK) LOCK FLUSH 100 UNIT/ML IV SOLN
500.0000 [IU] | Freq: Once | INTRAVENOUS | Status: AC
  Administered 2020-01-01: 500 [IU] via INTRAVENOUS

## 2020-01-01 MED ORDER — IOHEXOL 300 MG/ML  SOLN
100.0000 mL | Freq: Once | INTRAMUSCULAR | Status: AC | PRN
  Administered 2020-01-01: 100 mL via INTRAVENOUS

## 2020-01-01 NOTE — Progress Notes (Signed)
HEMATOLOGY-ONCOLOGY TELEPHONE VISIT PROGRESS NOTE  I connected with Heather [name] on 01/02/2020 at  3:30 PM EDT by telephone and verified that I am speaking with the correct person using two identifiers.  I discussed the limitations, risks, security and privacy concerns of performing an evaluation and management service by telephone and the availability of in person appointments.  I also discussed with the patient that there may be a patient responsible charge related to this service. The patient expressed understanding and agreed to proceed.   History of Present Illness: Heather [name] is a 74 y.o. female with above-mentioned history of metastatic breast cancercurrently onchemotherapy with trastuzumab-Deruxtecan (Enhertu)every 5 weeks.CT CAP on 01/01/20 showed stable osseous metastases and small prevascular nodes measuring up to 0.8cm, suspicious for early nodal recurrence. She presents over the phone today to review her scans.  Oncology History  Primary cancer of upper outer quadrant of right female breast (Heather [name])  03/08/2012 Initial Diagnosis   Right breast invasive ductal carcinoma ER positive PR negative HER-2 positive Ki-67 44%; another breast mass biopsied in the anterior part of the breast which was also positive for malignancy that was HER-2 negative   03/14/2012 Cancer Staging   Staging form: Breast, AJCC 7th Edition - Pathologic: Stage IV (M1) - Signed by [name], NP on 06/22/2018   05/10/2012 Surgery   Right mastectomy and axillary lymph node dissection: Multifocal disease 5 cm, 1.7 cm, 1.6 cm, ER positive PR negative HER-2 positive Ki-67 44%, 3/7 lymph nodes positive   06/14/2012 - 06/12/2013 Chemotherapy   Adjuvant chemotherapy with [place] 6 followed by Herceptin maintenance   10/25/2012 - 12/11/2012 Radiation Therapy   Adjuvant radiation therapy   02/07/2013 -  Anti-estrogen oral therapy   Letrozole 2.5 mg daily   02/14/2015 Imaging   MRI spine: Large destructive T5  lesion with severe pathologic compression fracture and extensive epidural tumor, severe spinal stenosis with moderate cord compression, tumor involvement of T4   03/18/2015 PET scan   Residual enhancing soft tissue adjacent to the spinal cord. No evidence of metastatic disease. Nonspecific uptake the left nipple   03/27/2015 - 03/30/2015 Hospital Admission   T4-6 decompression for spinal cord compression (lower extremity paralysis)   04/10/2015 -  Chemotherapy   Palliative treatment with Herceptin every 3 weeks with letrozole 2.5 mg daily   04/13/2015 - 05/19/2015 Radiation Therapy   Palliative radiation treatment to the spine   09/01/2015 Imaging   CT chest abdomen pelvis: Pathologic fracture with posterior fusion at T5 no other evidence of metastatic disease in the chest abdomen pelvis   12/23/2015 Imaging   CT CAP: new nonspecific 0.6 cm lymph node was stated mediastinum needs follow-up CT, innumerable tiny groundglass pulmonary nodules throughout both lungs unchanged, patchy consolidation from radiation, T5 fracture, no mets   04/15/2016 PET scan   Rt para-spinal mass mildy hypermetabolic SUV 5.9, lytic cortical lesion 4.8 cm lesion left prox femur suv 8.8 favor osseus met disease   04/28/2016 - 05/12/2016 Radiation Therapy   Palliative XRT Left femur   05/24/2016 Procedure   Soft tissue mass biopsy back: Metastatic breast cancer ER 80%, PR 0%, Ki-67 50%, HER-2 positive ratio 2.25   06/09/2016 - 03/29/2017 Anti-estrogen oral therapy   Faslodex with Herceptin and Perjeta every 4 weeks   06/13/2016 - 06/15/2016 Hospital Admission   uncomplicated revision of previous thoracic instrumentation.   10/10/2016 PET scan   Interval development of new hypermetabolic 2.5 x 1.1 cm lesion posterior right eighth rib consistent  with metastatic disease, stable right paraspinal lesion at T8, clustered soft tissue nodules in the posterior midline back near cervicothoracic junction smaller than before     11/03/2016 - 11/07/2016 Radiation Therapy   Palliative radiation to right eighth rib   11/04/2016 Imaging   Solitary contrast-enhancing lesion in the right cerebellum 6 x 2 mm concerning for metastatic lesion   11/24/2016 - 11/24/2016 Radiation Therapy   SRS to the brain lesion   04/18/2017 PET scan   New ill-defined rounded airspace opacity in posterior right lower lobe. Differential diagnosis includes malignancy and infectious/inflammatory process.Increased hypermetabolic lytic lesion in T8 vertebral body, consistent with bone metastasis.  Increased hypermetabolic posterior upper chest wall subcutaneous nodules, highly suspicious metastatic disease. Further improvement in right posterior eighth rib metastasis.   05/04/2017 - 07/27/2017 Chemotherapy   Kadcyla every 3 weeks palliative chemotherapy stopped for progression   07/26/2017 Imaging   Interval increase in size of the multiple enhancing masses posteriorly overlying the spine, interval development of left axillary and mediastinal adenopathy, increase in the size of lytic lesions in T8-T9 and left ninth rib, increased groundglass opacities right lower lobe lung   07/27/2017 - 05/11/2018 Chemotherapy   Palbociclib + Faslodex + trastuzumab + Pertuzumab     04/03/2018 PET scan   PET CT scan showed progression of disease not only in the bones but also in the subcutaneous nodules.   05/15/2018 - 05/25/2018 Radiation Therapy   Palliative radiation to the back   06/01/2018 -  Chemotherapy   Enhertu (Transtuzumab Deruxtecan)    08/07/2018 - 08/07/2018 Radiation Therapy   T12 vertebral body SRS   Carcinoma of right breast, stage 4 (HCC)  02/14/2015 Initial Diagnosis   Breast cancer metastasized to bone, right (Heather [name])   06/01/2018 -  Chemotherapy   The patient had palonosetron (ALOXI) injection 0.25 mg, 0.25 mg, Intravenous,  Once, 23 of 30 cycles Administration: 0.25 mg (06/01/2018), 0.25 mg (06/22/2018), 0.25 mg (08/24/2018), 0.25 mg (07/13/2018),  0.25 mg (08/03/2018), 0.25 mg (09/14/2018), 0.25 mg (10/05/2018), 0.25 mg (11/16/2018), 0.25 mg (10/26/2018), 0.25 mg (12/07/2018), 0.25 mg (12/28/2018), 0.25 mg (01/18/2019), 0.25 mg (03/15/2019), 0.25 mg (02/15/2019), 0.25 mg (04/12/2019), 0.25 mg (05/09/2019), 0.25 mg (06/07/2019), 0.25 mg (07/05/2019), 0.25 mg (08/09/2019), 0.25 mg (09/13/2019), 0.25 mg (10/18/2019), 0.25 mg (11/22/2019), 0.25 mg (12/27/2019) fam-trastuzumab deruxtecan-nxki (ENHERTU) 332 mg in dextrose 5 % 100 mL chemo infusion, 5.4 mg/kg = 332 mg, Intravenous,  Once, 23 of 30 cycles Dose modification: 4.4 mg/kg (original dose 5.4 mg/kg, Cycle 7, Reason: Provider Judgment), 3.2 mg/kg (original dose 5.4 mg/kg, Cycle 9, Reason: Dose not tolerated), 150 mg (original dose 5.4 mg/kg, Cycle 18, Reason: Dose not tolerated) Administration: 332 mg (06/01/2018), 332 mg (06/22/2018), 332 mg (07/13/2018), 332 mg (08/24/2018), 332 mg (08/03/2018), 332 mg (09/14/2018), 272 mg (10/05/2018), 200 mg (11/16/2018), 272 mg (10/26/2018), 200 mg (12/07/2018), 200 mg (12/28/2018), 200 mg (01/18/2019), 200 mg (03/15/2019), 200 mg (02/15/2019), 200 mg (04/12/2019), 200 mg (05/09/2019), 200 mg (06/07/2019), 150 mg (07/05/2019), 150 mg (08/09/2019), 150 mg (09/13/2019), 150 mg (10/18/2019), 150 mg (11/22/2019), 150 mg (12/27/2019)  for chemotherapy treatment.      Observations/Objective:     Assessment Plan:  Primary cancer of upper outer quadrant of right female breast (Heather [name]) Right breast invasive ductal carcinoma ER positive PR negative HER-2 positive Ki-67 44% multifocal disease 3/7 lymph nodes positive T2 N1 M0 stage IIB status post adjuvant chemotherapy with TCH followed by Herceptin maintenance, adjuvant radiation therapy and Letrozole. MRI 02/14/2015: T5 large destructive lesion  with pathologic compression fracture and extensive epidural tumor, involvement of T4, excision metastatic carcinoma ER 60%, PR 0%, HER-2 positive ratio 2.71 average copy #7.45 03/27/2015: T4-6 decompression surgery for  spinal cord compression PET-CT 12/10/17Rt para-spinal mass mildy hypermetabolic SUV 5.9, lytic cortical lesion 4.8 cm lesion left prox femur suv 8.8 favor osseus met disease Echo 04/12/2017 shows well preserved ejection fraction of 55-60%  Treatment summary: 1. Resumed anastrozole 05/21/2015, but it has caused significant hair loss so we switched her to exemestane 25 mg once daily 07/23/2015 2. Herceptin every 3 week started 04/10/2015 3. Bone metastases: Zometa every 3 months 4. Radiation therapy to the spine (Dr. [name]) 04/13/2015 to 05/19/2015 5. Radiation therapy to left femur 04/28/2016 to 05/12/2016  6. SRS to the brain lesion 11/24/2016 7.Kadcyla started 05/05/2017 discontinued 07/27/2017 8.Palbociclib +Faslodex +trastuzumab+ Pertuzumabstarted 08/10/2017, stopped 04/04/2018 9. Enhertu started 06/01/18 10SRS to brain January 2021 -------------------------------------------------------------------------------------------------------------------------- XRT to T123/04/2019 PET CT scan 10/03/2018:stable, improvement in subcutaneous mets   Current treatment:Cycle19day 1Enhertu (Fam-trastuzumab-Durextecan)currently being given every 5 weeks will be moving to every 6 weeks Echo7/2020shows EF of 55-60%. Completed every 6 months.   CT CAP 01/01/2020: Small prevascular lymph nodes measuring 8 mm suspicious for early nodal recurrence, stable bone metastases Bone scan 01/01/2020: Bone metastases right eighth rib, L2, indeterminate focus left and right femur  I recommend continuation of current treatment based on the scans.  The small 8 mm nodal metastases is not of terrible concern at this time. We will switch her Enhertu treatments to every 6 weeks. Scans will be performed in 6 months (end of February 2022)   I discussed the assessment and treatment plan with the patient. The patient was provided an opportunity to ask questions and all were answered. The patient agreed with  the plan and demonstrated an understanding of the instructions. The patient was advised to call back or seek an in-person evaluation if the symptoms worsen or if the condition fails to improve as anticipated.   I provided 21 minutes of non-face-to-face time during this encounter.   Rulon Eisenmenger, MD 01/02/2020    I, Molly Dorshimer, am acting as scribe for Heather Lose, MD.  I have reviewed the above documentation for accuracy and completeness, and I agree with the above.

## 2020-01-02 ENCOUNTER — Inpatient Hospital Stay (HOSPITAL_BASED_OUTPATIENT_CLINIC_OR_DEPARTMENT_OTHER): Payer: Medicare Other | Admitting: Hematology and Oncology

## 2020-01-02 ENCOUNTER — Ambulatory Visit
Admission: RE | Admit: 2020-01-02 | Discharge: 2020-01-02 | Disposition: A | Discharge status: Home / Self Care | Payer: Medicare Other | Source: Ambulatory Visit | Attending: Radiation Oncology | Admitting: Radiation Oncology

## 2020-01-02 DIAGNOSIS — C50411 Malignant neoplasm of upper-outer quadrant of right female breast: Secondary | ICD-10-CM

## 2020-01-02 DIAGNOSIS — C50919 Malignant neoplasm of unspecified site of unspecified female breast: Secondary | ICD-10-CM | POA: Diagnosis not present

## 2020-01-02 DIAGNOSIS — C7931 Secondary malignant neoplasm of brain: Secondary | ICD-10-CM

## 2020-01-02 DIAGNOSIS — C7949 Secondary malignant neoplasm of other parts of nervous system: Secondary | ICD-10-CM

## 2020-01-02 DIAGNOSIS — C7951 Secondary malignant neoplasm of bone: Secondary | ICD-10-CM | POA: Diagnosis not present

## 2020-01-02 MED ORDER — SODIUM CHLORIDE 0.9% FLUSH: 10.0000 mL | INTRAVENOUS | Status: DC | PRN

## 2020-01-02 MED ORDER — HEPARIN SOD (PORK) LOCK FLUSH 100 UNIT/ML IV SOLN
500.0000 [IU] | Freq: Once | INTRAVENOUS | Status: AC
  Administered 2020-01-02: 500 [IU] via INTRAVENOUS

## 2020-01-02 MED ORDER — GADOBENATE DIMEGLUMINE 529 MG/ML IV SOLN
11.0000 mL | Freq: Once | INTRAVENOUS | Status: AC | PRN
  Administered 2020-01-02: 11 mL via INTRAVENOUS

## 2020-01-02 NOTE — Assessment & Plan Note (Signed)
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 MRI 02/14/2015: T5 large destructive lesion with pathologic compression fracture and extensive epidural tumor, involvement of T4, excision metastatic carcinoma ER 60%, PR 0%, HER-2 positive ratio 2.71 average copy #7.45 03/27/2015: T4-6 decompression surgery for spinal cord compression PET-CT 12/10/17Rt para-spinal mass mildy hypermetabolic SUV 5.9, lytic cortical lesion 4.8 cm lesion left prox femur suv 8.8 favor osseus met disease Echo 04/12/2017 shows well preserved ejection fraction of 55-60%  Treatment summary: 1. Resumed anastrozole 05/21/2015, but it has caused significant hair loss so we switched her to exemestane 25 mg once daily 07/23/2015 2. Herceptin every 3 week started 04/10/2015 3. Bone metastases: Zometa every 3 months 4. Radiation therapy to the spine (Dr. [name]) 04/13/2015 to 05/19/2015 5. Radiation therapy to left femur 04/28/2016 to 05/12/2016  6. SRS to the brain lesion 11/24/2016 7.Kadcyla started 05/05/2017 discontinued 07/27/2017 8.Palbociclib +Faslodex +trastuzumab+ Pertuzumabstarted 08/10/2017, stopped 04/04/2018 9. Enhertu started 06/01/18 10SRS to brain January 2021 -------------------------------------------------------------------------------------------------------------------------- XRT to T123/04/2019 PET CT scan 10/03/2018:stable, improvement in subcutaneous mets   Current treatment:Cycle19day 1Enhertu (Fam-trastuzumab-Durextecan)currently being given every 5 weeks Echo7/2020shows EF of 55-60%. Completed every 6 months.   CT CAP 01/01/2020: Small prevascular lymph nodes measuring 8 mm suspicious for early nodal recurrence, stable bone metastases Bone scan 01/01/2020: Bone  metastases right eighth rib, L2, indeterminate focus left and right femur  I recommend continuation of current treatment based on the scans.  The small 8 mm nodal metastases is not of terrible concern at this time.

## 2020-01-03 DIAGNOSIS — I10 Essential (primary) hypertension: Secondary | ICD-10-CM | POA: Diagnosis not present

## 2020-01-06 ENCOUNTER — Inpatient Hospital Stay: Payer: Medicare Other

## 2020-01-06 ENCOUNTER — Telehealth: Payer: Self-pay | Admitting: Hematology and Oncology

## 2020-01-06 NOTE — Telephone Encounter (Signed)
Schedule per 8/26 los. Called and spoke with pt, confirmed added appts

## 2020-01-07 ENCOUNTER — Other Ambulatory Visit: Payer: Self-pay

## 2020-01-07 ENCOUNTER — Encounter: Payer: Self-pay | Admitting: Urology

## 2020-01-07 DIAGNOSIS — I10 Essential (primary) hypertension: Secondary | ICD-10-CM | POA: Diagnosis not present

## 2020-01-08 ENCOUNTER — Ambulatory Visit
Admission: RE | Admit: 2020-01-08 | Discharge: 2020-01-08 | Disposition: A | Discharge status: Home / Self Care | Payer: Medicare Other | Source: Ambulatory Visit | Attending: Urology | Admitting: Urology

## 2020-01-08 DIAGNOSIS — C7951 Secondary malignant neoplasm of bone: Secondary | ICD-10-CM | POA: Diagnosis not present

## 2020-01-08 DIAGNOSIS — C7931 Secondary malignant neoplasm of brain: Secondary | ICD-10-CM

## 2020-01-08 DIAGNOSIS — Z853 Personal history of malignant neoplasm of breast: Secondary | ICD-10-CM | POA: Diagnosis not present

## 2020-01-08 DIAGNOSIS — C50911 Malignant neoplasm of unspecified site of right female breast: Secondary | ICD-10-CM

## 2020-01-08 DIAGNOSIS — Z08 Encounter for follow-up examination after completed treatment for malignant neoplasm: Secondary | ICD-10-CM | POA: Diagnosis not present

## 2020-01-08 NOTE — Progress Notes (Signed)
Radiation Oncology         [phone] ________________________________  Name: Heather [name] MRN: [id number]  Date: 01/08/2020  DOB: Aug 07, 1945  Follow up Visit Note- Conducted via telephone due to current COVID-19 concerns for limiting patient exposure  CC: Heather Stains, MD  [name], MD  Diagnosis:   74 y.o. woman with ER positive, PR negative, HER-2 positive metastatic breast cancer with multifocal metastases involving the spine, bony skeleton, lung, and brain- most recently with an enlarging central cerebellar metastasis.   Interval Since Last Radiation: 7 months 05/31/19: SRS brain/ The 19mm central cerebellar target was treated to a prescription dose of 20 Gy in a single fraction.  08/07/18:  SRS Spine/ The targeted metastases in the T12 and L2 vertebral bodies were treated to a prescription dose of 18 Gy in 1 fraction.   05/14/2018 - 05/25/2018:  Chest Wall, Right Upper Posterior subcutaneous lesions / treated to 30 Gy delivered in 10 fractions of 3 Gy  05/18/2017 - 05/31/2017:  The thoracic spine (T7-T9) was treated to 30 Gy in 10 fractions of 3 Gy.  6/27-7/18/18 SRS and palliative treatment: 1.  The rib 8th was treated to 30 Gy in 10 fractions 2.  Right cerebellar 6 mm target was treated using 3 Dynamic Conformal Arcs to a prescription dose of 20 Gy in one fraction  04/27/16-05/12/16:  Left femur/ 30 Gy in 10 fractions   04/13/2015-05/19/2015 Postop:  55 Gy in 25 fractions of 2.2 Gy to the thoracic spine   10/25/12-12/11/12: 50.4 Gy to the right chest wall and regional lymph nodes in 28 fractions with a 10 Gy boost to the right chest wall in 1 session.  Narrative:  In summary, Heather [name] is a well known patient to our service with a history of metastatic breast cancer originally treated in 2014 to the chest wall and regional lymph nodes. She developed recurrent disease within the thoracic spine and subsequently has undergone 3 surgeries, in addition to radiotherapy, with her  most recent surgery being on 06/13/2016 where she had removal of thoracic hardware and replacement of pedicle screws with stabilization.  Of note previously placed screws between T3, T4 T6 and T7 were identified and dissection was carried out inferiorly to T8, T9-T10 and T11, T8 through T11 pedicle screw trajectories were placed bilaterally. She recovered well since treatment to her spine as she had been paralyzed in 2016, and with rigorous therapy has been able to make an impressive recovery. She continues on systemic therapy under the care of [name] and is also followed along in the brain and spine oncology conference. PET scan on 10/10/16 revealed a new hypermetabolic 2.5 x 1.1 cm lesion in the posterior right 8th rib. She also mentioned having new onset headaches, dizziness, and an episode of visual change,so an MRI brain was performed 11/04/16 demonstrating a solitary contrast-enhancing lesion within the right cerebellum, measuring 6 x 2 mm and lying just beneath the right tentorial leaflet and concerning for a metastatic lesion.  This solitary lesion was confirmed with SRS protocol MRI brain on 11/16/16 with no new lesions noted. This lesion was treated with SRS on 11/23/16 which she tolerated very well. She also completed palliative radiotherapy to the right eighth rib from 11/02/16 - 11/23/16 with significant improvement in her pain. She remained on Herceptin, Perjeta, Faslodex and Zometa for systemic disease under the care and direction of [name].  She developed increased pain in the T8 region and also had PET positive findings  in this region, as well as hypermetabolic right lower lobe mass concerning for progressive metastatic disease on follow up imaging 04/18/2017, despite systemic treatment. Her systemic therapy was switched to Kadcyla on 05/05/17 with discontinuation of Faslodex, Herceptin, and Perjeta.  She proceeded with palliative radiotherapy to 30 Gy in 10 fractions to the thoracic spine  (T7-T9) from 05/18/2017 - 05/31/2017.  This treatment was tolerated very well.  A repeat brain MRI performed 06/15/17 showed no new or progressive contrast enhancing lesions and no residual abnormality at the site of the previous treated right cerebellar lesion but Chest CT on 07/26/17 showed clear disease progression with interval increase in size of the multiple enhancing masses posteriorly overlying the spine, interval development of left axillary and mediastinal adenopathy, increase in the size of lytic lesions in T8-T9 and left ninth rib, as well as increased groundglass opacities right lower lobe lung. Therefore, she was switched to Palbociclib + Faslodex + trastuzumab + Pertuzumab, started 08/10/17.   MRI T-spine on 09/21/17 showed the known T12 lesion had slightly progressed by a few millimeters from 03/15/17 and there was a new lesion at L2 which was not present on lumbar MRI from 07/13/16. Follow up systemic imaging on 12/13/17, showed a good response to treatment with improved LAN and decreased subcutaneous nodules in the upper thoracic region.  Follow up brain MRI imaging has continued to show interval increase in size the right parietal calvarium metastasis with stable appearance in size of a right frontal calvarial metastasis and no parenchymal or dural metastases. PET scan from 04/03/2018 unfortunately showed significant disease progression despite systemic therapy.  Her systemic chemotherapy was discontinued on 04/04/2018 due to disease progression and she was switched to [facility] (Fam-trastuzumab-Durextecan) q 3 weeks beginning 06/01/18.  Repeat MRI imaging of the brain and thoracic spine was performed on 07/12/2018.  Brain imaging demonstrated 3 skull metastases ranging from 5 to 14 mm in diameter which appeared stable to slightly increased since December 2019 but no new metastases or brain parenchymal or dural metastases identified.  On thoracic spine imaging there was enlargement of the left paracentral T12  vertebral body metastasis increased from 14 mm in May 2019 to 20 mm currently without associated pathologic fracture and otherwise stable appearance of previously treated lesions in the thoracic spine with no new disease noted.  The posterior subcutaneous nodular metastases appear regressed.  Her imaging studies were reviewed and discussed at the multidisciplinary brain and spine conference on 07/16/2018 and the consensus recommendation was to proceed with stereotactic radiosurgery [facility]) to the progressive disease at T12 which was completed on 08/07/18.  At the time of her [facility] for treatment planning, it was also noted that she had an enlarging lesion in the vertebral body of L2 as compared to her PET scan from 03/28/2018 so this lesion was included in the planning and was treated with a single fraction SRS at the time of T12 treatment as well. She tolerated the treatment well and did not experience any ill side effects.  Follow up systemic imaging with PET 10/03/18 showed a good response to treatment with less activity in the recently treated lesions at L2 and T12, no new bony lesions, as well as resolution of subcutaneous nodules in the upper back and pubic area and resolution of right adrenal hypermetabolic mass.  Her brain MRI from 10/13/18 was also stable with no visible intraparenchymal metastases to the brain and an unchanged appearance of 3 calvarial metastases.  She has longstanding weakness in bilateral LEs,  unchanged recently and she associates this with her inactivity due to pronounced fatigue following systemic treatments with ENHERTU.  She reports that it begins to improve approximately 2 weeks after her treatments when she is able to become more mobile but resumes following each treatment every 3 weeks.  She denies any new or increased paraesthesias in the lower extremities but reports neuropathy in bilateral UEs as well as "nerve pain" in the right axilla that travels down her right side/torso.  She  also reports chronic right>left jaw pain and reports that her dentist has told her previously that she had some loss of bone in the right jaw based on xray evaluation.  She is taking Zometa q 3 months and will discuss this with [name].   A follow up MRI brain scan from 05/17/2019 showed a slowly enlarging metastasis at the superior cerebellar vermis measuring 4 mm but without mass-effect or edema.  3 additional known calvarial metastases did not show any growth or progression.  After discussion of these findings, the patient elected to proceed with [place] treatment to the enlarging lesion and this was completed in a single fraction on 05/31/2019. She tolerated the treatment very well, without any ill side effects. Follow up MRI brain scan performed 09/01/19 showed stability of the previously treated lesions and calvarial lesions in the right frontal and parietal regions. There was a slight increase in size (from 1.2 cm to 1.6 cm) of a right temporal calvarial metastasis but no new metastases identified and no parenchymal disease.  She has continued ENHERTU systemic therapy every 5 weeks under the care of [name] and continue to tolerate this well.  INTERVAL HISTORY:   I spoke with the patient to conduct her routine scheduled 3 month follow up visit to review the most recent MRI brain scan via telephone to spare the patient unnecessary potential exposure in the healthcare setting during the current COVID-19 pandemic.  The patient was notified in advance and gave permission to proceed with this visit format.   She reports that she has continued doing well overall and has recovered well from the effects of her most recent SRS.  She denies headaches, dizziness, N/V or visual changes. She has had some progressive bilateral thigh pain, L>R. The pain occasionally wakes her from sleep at night and is worse with long periods of standing or weightbearing activities. She denies any recent trauma or injury. She has had  similar pain in the past and was found to have bony metastasis in the mid left femur at that time, and subsequently treated with palliative radiation in 2018.  The most recent MRI brain scan from 01/02/20 shows 2 new enhancing skull metastases, measuring 9 mm at the superolateral aspect of the left orbit and 5 mm in the left temporal bone but no evidence of residual parenchymal disease and other skull lesions appear relatively stable.  This scan was reviewed at recent brain and spine tumor board and consensus recommendation is to continue to monitor the skull lesions on routine follow up scans.  She has continued her systemic therapy with ENHERTU under the care and direction of [name] having completed 22 cycles as of 12/27/19 and the current plan is to continue on this regimen.  Recent follow up systemic imaging with CT A/P and Bone scan on 01/01/20 showed stable osseous metastases and small prevascular nodes measuring up to 0.8 cm, suspicious for early nodal recurrence. She met with [name] on 01/02/20 and the recommendation is to continue  with ENHERTU, moving out to every 6 weeks and repeat scans in 6 months (around 06/2020).  On review of systems, the patient reports that she continues doing well overall.  She continues with moderate fatigue which is tolerable and not significantly interfering with her daily activities aside from the first 2 weeks after systemic treatments.  She has tolerated her systemic therapy much better since her dose was reduced on 10/05/18.  She has persistent low grade nausea but no vomiting and is able to eat.  She remains as active as she can be.  She has noted continued improvement in the size of the subcutaneous nodules and in the burning/tingling pain that radiated from the left aspect of the spine underneath her breast since completing radiotherapy. She has not had recent changes in visual or auditory acuity, tinnitus, increased imbalance, tremor or seizure activity. She  denies recent headaches, sternal chest pain, shortness of breath, cough, fevers, chills, night sweats, or recent unintended weight changes. She denies any bowel or bladder disturbances, and denies abdominal pain or vomiting currently. She denies any new musculoskeletal or joint aches or pains, new skin lesions or concerns aside from those mentioned above in the HPI.  A complete review of systems is obtained and is otherwise negative.  Past Medical History:  Past Medical History:  Diagnosis Date  . Anemia    hx of  . Anxiety    r/t updated surgery  . Breast cancer ([facility])    right  . Cataract    immature-not sure of which eye  . Complication of anesthesia    pt states she is sensitive to meds  . Dizziness    has been going on for 55months;medical MD aware  . Genetic testing 07/11/2016   Test Results: Normal; No pathogenic mutations detected  Genes Analyzed: 43 genes on [employer] Common Cancers panel (APC, ATM, AXIN2, BARD1, BMPR1A, BRCA1, BRCA2, BRIP1, CDH1, CDKN2A, CHEK2, DICER1, EPCAM, GREM1, HOXB13, KIT, MEN1, MLH1, MSH2, MSH6, MUTYH, NBN, NF1, PALB2, PDGFRA, PMS2, POLD1, POLE, PTEN, RAD50, RAD51C, RAD51D, SDHA, SDHB, SDHC, SDHD, SMAD4, SMARCA4, STK11, TP53, TSC1, TSC2, VHL).  Marland Kitchen GERD (gastroesophageal reflux disease)    Tums prn  . H. pylori infection   . H/O hiatal hernia   . Hemorrhoids   . History of UTI   . Hx of radiation therapy 10/25/12- 12/11/12   right chest wall/regional lymph nodes 5040 cGy, 28 sessions, right chest wall boost 1000 cGy 1 session  . Hx: UTI (urinary tract infection)   . Hyperlipidemia    but not on meds;diet and exercise controlled  . Hypertension    recently started Aldactone   . Insomnia    takes Melatoniin daily  . Metastasis to spinal column ([facility])    T5, Left  Femur cancer- radiation   . Neuropathy    "from back surgery"  . PONV (postoperative nausea and vomiting)    pt experienced hair loss, confusion and combative- 02/15/15  . Right knee pain   .  Right shoulder pain   . Skin cancer    squamous, Nodule to back - "same cancer as breast."  . Urinary frequency    d/t taking Aldactone    Past Surgical History: Past Surgical History:  Procedure Laterality Date  . COLONOSCOPY    . cyst removed from left breast  1970  . DECOMPRESSIVE LUMBAR LAMINECTOMY LEVEL 4 N/A 02/15/2015   Procedure: DECOMPRESSION T5 AND T3-T7 STABALIZATION;  Surgeon: Consuella Lose, MD;  Location: [facility];  Service:  Neurosurgery;  Laterality: N/A;  . ESOPHAGOGASTRODUODENOSCOPY    . LAMINECTOMY N/A 03/27/2015   Procedure: Thoracic Four-Thoracic Six Laminectomy for tumor;  Surgeon: Consuella Lose, MD;  Location: [place] [facility]UR[facility] ORS;  Service: Neurosurgery;  Laterality: N/A;  T4-T6 Laminectomy  . left wrist surgery   2004   with plate  . MASTECTOMY MODIFIED RADICAL  05/10/2012   Procedure: MASTECTOMY MODIFIED RADICAL;  Surgeon: Merrie Roof, MD;  Location: [place];  Service: General;  Laterality: Right;  RIGHT MODIFIED RADICAL MASTECTOMY  . MASTECTOMY, RADICAL Right   . MINOR BREAST BIOPSY Left 05/16/2016   Procedure: EXCISION OF BACK NODULE;  Surgeon: Autumn Messing III, MD;  Location: [place];  Service: General;  Laterality: Left;  . Nodule  back  05/17/2015  . PORTACATH PLACEMENT  05/10/2012   Procedure: INSERTION PORT-A-CATH;  Surgeon: Merrie Roof, MD;  Location: [facility] OR;  Service: General;  Laterality: Left;    Social History:  Social History   Socioeconomic History  . Marital status: Married    Spouse name: Not on file  . Number of children: 2  . Years of education: Not on file  . Highest education level: Not on file  Occupational History  . Not on file  Tobacco Use  . Smoking status: Never Smoker  . Smokeless tobacco: Never Used  Vaping Use  . Vaping Use: Never used  Substance and Sexual Activity  . Alcohol use: No  . Drug use: No  . Sexual activity: Yes    Birth control/protection: Post-menopausal  Other Topics Concern  . Not  on file  Social History Narrative  . Not on file   Social Determinants of Health   Financial Resource Strain:   . Difficulty of Paying Living Expenses: Not on file  Food Insecurity:   . Worried About Charity fundraiser in the Last Year: Not on file  . Ran Out of Food in the Last Year: Not on file  Transportation Needs:   . Lack of Transportation (Medical): Not on file  . Lack of Transportation (Non-Medical): Not on file  Physical Activity:   . Days of Exercise per Week: Not on file  . Minutes of Exercise per Session: Not on file  Stress:   . Feeling of Stress : Not on file  Social Connections:   . Frequency of Communication with Friends and Family: Not on file  . Frequency of Social Gatherings with Friends and Family: Not on file  . Attends Religious Services: Not on file  . Active Member of Clubs or Organizations: Not on file  . Attends Archivist Meetings: Not on file  . Marital Status: Not on file  Intimate Partner Violence:   . Fear of Current or Ex-Partner: Not on file  . Emotionally Abused: Not on file  . Physically Abused: Not on file  . Sexually Abused: Not on file    Family History: Family History  Problem Relation Age of Onset  . Leukemia Mother 31       deceased 39  . Stroke Father   . Diabetes Mellitus II Father   . Prostate cancer Brother 70       currently 16  . Stomach cancer Maternal Grandmother 95       deceased 66  . Prostate cancer Brother 23       currently 75     ALLERGIES:  is allergic to other, acyclovir and related, chlorhexidine, zanaflex [tizanidine hcl], codeine, keflex [cephalexin],  naprosyn [naproxen], oxycodone, tramadol, and tussin [guaifenesin].  Meds: Current Outpatient Medications  Medication Sig Dispense Refill  . ALPRAZolam (XANAX) 0.25 MG tablet Take 1 tablet by mouth as needed.    Marland Kitchen BOOSTRIX 5-2.5-18.5 LF-MCG/0.5 injection     . Cholecalciferol (VITAMIN D) 2000 UNITS tablet Take 2,000 Units by mouth daily.    .  fluticasone (FLONASE) 50 MCG/ACT nasal spray     . gabapentin (NEURONTIN) 600 MG tablet Take 1 tablet (600 mg total) by mouth as directed. Take 1 tablet in the AM and afternoon and 1 1/2 tabs at bedtime 315 tablet 2  . lidocaine-prilocaine (EMLA) cream Apply to affected area once 30 g 3  . omeprazole (PRILOSEC) 40 MG capsule TAKE 1 CAPSULE BY MOUTH ONCE DAILY FOR 30 DAYS  5  . ondansetron (ZOFRAN) 8 MG tablet Take 1 tablet (8 mg total) by mouth every 8 (eight) hours as needed for nausea or vomiting. 20 tablet 0  . OVER THE COUNTER MEDICATION Place 1 drop into both eyes 2 (two) times daily as needed (dry eyes). Over the counter lubricating eye drops    . polyethylene glycol (MIRALAX / GLYCOLAX) packet Take 17 g by mouth daily as needed.    . polyethylene glycol powder (GLYCOLAX/MIRALAX) 17 GM/SCOOP powder Take by mouth.    . prochlorperazine (COMPAZINE) 10 MG tablet Take 1 tablet (10 mg total) by mouth every 6 (six) hours as needed for nausea or vomiting. 60 tablet 3  . spironolactone (ALDACTONE) 25 MG tablet Take 25 mg by mouth daily.    . cycloSPORINE (RESTASIS) 0.05 % ophthalmic emulsion 1 drop 2 (two) times daily. (Patient not taking: Reported on 01/07/2020)    . HYDROcodone-acetaminophen (NORCO/VICODIN) 5-325 MG tablet Take 1 tablet by mouth every 6 (six) hours as needed for moderate pain. (Patient not taking: Reported on 01/07/2020) 30 tablet 0  . Probiotic Product (PROBIOTIC DAILY PO) Take 1 capsule by mouth daily.  (Patient not taking: Reported on 01/07/2020)     No current facility-administered medications for this encounter.    Physical Findings:  Unable to assess due to telephone follow-up visit format.   Lab Findings: Lab Results  Component Value Date   WBC 4.2 12/27/2019   HGB 13.8 12/27/2019   HCT 40.9 12/27/2019   MCV 89.9 12/27/2019   PLT 84 (L) 12/27/2019     Radiographic Findings: MR Brain W Wo Contrast  Addendum Date: 01/06/2020   ADDENDUM REPORT: 01/06/2020 08:17  ADDENDUM: Original report by Dr. Carlis Abbott. Addendum by Dr. Jeralyn Ruths on 01/06/2020 after reviewing the case in multidisciplinary oncology conference: 2 new enhancing skull metastases measure 9 mm at the superolateral aspect of the left orbit (series 11, image 89) and 5 mm in the left temporal bone (series 11, image 76). Electronically Signed   By: Logan Bores M.D.   On: 01/06/2020 08:17   Addendum Date: 01/02/2020   ADDENDUM REPORT: 01/02/2020 13:57 ADDENDUM: The Port-A-Cath was  accessed for contrast injection. Electronically Signed   By: Franchot Gallo M.D.   On: 01/02/2020 13:57   Result Date: 01/06/2020 CLINICAL DATA:  Metastatic breast cancer. Follow-up metastatic disease post treatment. EXAM: MRI HEAD WITHOUT AND WITH CONTRAST TECHNIQUE: Multiplanar, multiecho pulse sequences of the brain and surrounding structures were obtained without and with intravenous contrast. CONTRAST:  22mL MULTIHANCE GADOBENATE DIMEGLUMINE 529 MG/ML IV SOLN COMPARISON:  MRI head 09/02/2019 FINDINGS: Brain: No residual enhancing brain metastasis. Previous lesion in the superior cerebellar vermis show mild enhancement but is not enhancing  today. Metastatic disease to the right anterior temporal bone shows progression. Progressive extra-axial enhancing tumor anterior and lateral to the temporal lobe Ventricle size normal. No midline shift. No brain edema. Small white matter hyperintensities bilaterally are stable consistent with chronic disease. Negative for intracranial hemorrhage. Vascular: Normal arterial flow voids Skull and upper cervical spine: 4 mm enhancing lesion right frontal bone unchanged. Patchy enhancement right parietal lobe unchanged Enhancing tumor in the right anterior temporal bone has progressed. Progressive extra-axial soft tissue tumor extending intracranially also with progression. Sinuses/Orbits: Mild mucosal edema paranasal sinuses. Negative orbit. Other: None IMPRESSION: No residual brain metastasis identified  Progression of metastatic disease to the right anterior temporal bone. Progression of intracranial extension of tumor lateral to the right temporal lobe. Electronically Signed: By: Marlan Palau M.D. On: 01/02/2020 13:08   NM Bone Scan Whole Body  Result Date: 01/01/2020 CLINICAL DATA:  Breast cancer. Stage IV with spinal cord compression to bone metastasis. History T3-T5 decompression. EXAM: NUCLEAR MEDICINE WHOLE BODY BONE SCAN TECHNIQUE: Whole body anterior and posterior images were obtained approximately 3 hours after intravenous injection of radiopharmaceutical. RADIOPHARMACEUTICALS:  21.9 mCi Technetium-35m MDP IV COMPARISON:  CT 01/01/2020 FINDINGS: Focal uptake within the RIGHT aspect of the L2 vertebral body. This corresponds to sclerosis on comparison CT. Mild uptake in the posterior thoracic spine associated with the posterior fusion. Relatively long segment of abnormal uptake within the posterior aspect of the RIGHT eighth rib. This is mixed lytic and sclerotic lesion at this level on comparison CT. Uptake in the distal aspect of the RIGHT femur at the metadiaphysis is indeterminate. Focal uptake in the midshaft of the LEFT femur. IMPRESSION: 1. skeletal metastasis to the posterior RIGHT eighth rib. 2. Skeletal metastasis in the L2 vertebral bodies corresponds to sclerotic lesion on comparison CT. 3. Indeterminate regions of uptake within the LEFT and RIGHT femur. Recommend radiographs of the femurs for evaluation of skeletal metastasis if clinically relevant. Electronically Signed   By: Genevive Bi M.D.   On: 01/01/2020 16:13   CT CHEST ABDOMEN PELVIS W CONTRAST  Result Date: 01/01/2020 CLINICAL DATA:  Metastatic right breast cancer, chemotherapy/Herceptin on going, XRT complete EXAM: CT CHEST, ABDOMEN, AND PELVIS WITH CONTRAST TECHNIQUE: Multidetector CT imaging of the chest, abdomen and pelvis was performed following the standard protocol during bolus administration of intravenous  contrast. CONTRAST:  OMNIPAQUE IOHEXOL 300 MG/ML  SOLN COMPARISON:  07/02/2019 FINDINGS: CT CHEST FINDINGS Cardiovascular: The heart is normal in size. No pericardial effusion. No evidence of thoracic aortic aneurysm. Mild atherosclerotic calcifications of the aortic arch. Right chest port terminates cavoatrial junction. Mediastinum/Nodes: Small prevascular nodes measuring up to 8 mm short axis (series 2/image 24), new. Otherwise, no suspicious mediastinal lymphadenopathy. No suspicious hilar or axillary lymphadenopathy. Status post right axillary lymph node dissection. Visualized thyroid is unremarkable. Lungs/Pleura: Radiation changes in the right upper lobe/paramediastinal regions. Patchy opacity in the posterior right upper lobe favors additional radiation changes, with scarring/atelectasis in the posterior right lower lobe. Additional linear scarring/atelectasis in the left lower lobe. Faint ground-glass nodularity predominantly in the left lower lobe measuring up to 3 mm (series 6/image 93), unchanged, likely benign. No new/suspicious pulmonary nodules. No pleural effusion or pneumothorax. Musculoskeletal: Status post right mastectomy. Stable sclerotic metastases involving the right acromion (series 2/image 6), the left T12 vertebral body (series 2/image 52), and possibly the left lateral 9th rib (series 2/image 50). Severe pathologic compression fracture deformity at T5 with associated kyphosis. T2-11 posterior spinal fusion. CT ABDOMEN PELVIS  FINDINGS Hepatobiliary: Liver is within normal limits. No suspicious/enhancing hepatic lesions. Layering 4 mm gallstone (series 2/image 34), without associated inflammatory changes. No intrahepatic or extrahepatic ductal dilatation. Pancreas: Mild parenchymal atrophy of the pancreatic tail. Spleen: Within normal limits. Adrenals/Urinary Tract: Adrenal glands are within normal limits. Kidneys are within normal limits.  No hydronephrosis. Bladder is within normal  limits. Stomach/Bowel: Stomach is notable for a tiny hiatal hernia. No evidence of bowel obstruction. Normal appendix (series 2/image 100). Vascular/Lymphatic: No evidence of abdominal aortic aneurysm. No suspicious abdominopelvic lymphadenopathy. Reproductive: Uterus is within normal limits. Right ovary is within normal limits.  No left adnexal mass. Other: No abdominopelvic ascites. Musculoskeletal: Stable sclerotic metastasis involving the right L2 vertebral body (series 2/image 64). Otherwise, no focal osseous lesions. IMPRESSION: Stable right mastectomy with right axillary lymph node dissection. Radiation changes in the lungs bilaterally. Small prevascular nodes measuring up to 8 mm short axis, new, suspicious for early nodal recurrence. Consider PET-CT for further evaluation. Stable osseous metastases, as above. Electronically Signed   By: Julian Hy M.D.   On: 01/01/2020 09:19   ECHOCARDIOGRAM COMPLETE  Result Date: 12/30/2019    ECHOCARDIOGRAM REPORT   Patient Name:   Heather [name] Date of Exam: 12/30/2019 Medical Rec #:  [id number]     Height:       62.0 in Accession #:    [id number]    Weight:       127.8 lb Date of Birth:  February 25, 1946     BSA:          1.580 m Patient Age:    19 years      BP:           149/75 mmHg Patient Gender: F             HR:           73 bpm. Exam Location:  Inpatient Procedure: 2D Echo, 3D Echo and Strain Analysis Indications:    Chemo V67.2 / Z09  History:        Patient has prior history of Echocardiogram examinations. Risk                 Factors:Hypertension and Dyslipidemia. Breast Cancer.  Sonographer:    Darlina Sicilian RDCS Referring Phys: [id number] DANIEL R BENSIMHON IMPRESSIONS  1. Left ventricular ejection fraction, by estimation, is 60 to 65%. The left ventricle has normal function. The left ventricle has no regional wall motion abnormalities. Left ventricular diastolic parameters are consistent with Grade I diastolic dysfunction (impaired relaxation).  2. Right  ventricular systolic function is normal. The right ventricular size is normal.  3. The mitral valve is normal in structure. No evidence of mitral valve regurgitation. No evidence of mitral stenosis.  4. The aortic valve is normal in structure. Aortic valve regurgitation is not visualized. No aortic stenosis is present.  5. The inferior vena cava is normal in size with greater than 50% respiratory variability, suggesting right atrial pressure of 3 mmHg. Comparison(s): No significant change from prior study. Prior images reviewed side by side. FINDINGS  Left Ventricle: Left ventricular ejection fraction, by estimation, is 60 to 65%. The left ventricle has normal function. The left ventricle has no regional wall motion abnormalities. The left ventricular internal cavity size was normal in size. There is  no left ventricular hypertrophy. Left ventricular diastolic parameters are consistent with Grade I diastolic dysfunction (impaired relaxation). Right Ventricle: The right ventricular size is normal. No increase in right ventricular wall  thickness. Right ventricular systolic function is normal. Left Atrium: Left atrial size was normal in size. Right Atrium: Right atrial size was normal in size. Pericardium: There is no evidence of pericardial effusion. Mitral Valve: The mitral valve is normal in structure. Normal mobility of the mitral valve leaflets. No evidence of mitral valve regurgitation. No evidence of mitral valve stenosis. Tricuspid Valve: The tricuspid valve is normal in structure. Tricuspid valve regurgitation is not demonstrated. No evidence of tricuspid stenosis. Aortic Valve: The aortic valve is normal in structure. Aortic valve regurgitation is not visualized. No aortic stenosis is present. Pulmonic Valve: The pulmonic valve was normal in structure. Pulmonic valve regurgitation is not visualized. No evidence of pulmonic stenosis. Aorta: The aortic root is normal in size and structure. Venous: The inferior  vena cava is normal in size with greater than 50% respiratory variability, suggesting right atrial pressure of 3 mmHg. IAS/Shunts: No atrial level shunt detected by color flow Doppler.  LEFT VENTRICLE PLAX 2D LVIDd:         3.90 cm  Diastology LVIDs:         2.70 cm  LV e' lateral:   10.40 cm/s LV PW:         0.80 cm  LV E/e' lateral: 8.1 LV IVS:        0.80 cm  LV e' medial:    7.94 cm/s LVOT diam:     1.60 cm  LV E/e' medial:  10.6 LV SV:         49 LV SV Index:   31       2D Longitudinal Strain LVOT Area:     2.01 cm 2D Strain GLS (A2C):   -17.3 %                          3D Volume EF:                         3D EF:        60 %                         LV EDV:       88 ml                         LV ESV:       35 ml                         LV SV:        53 ml RIGHT VENTRICLE RV S prime:     10.10 cm/s TAPSE (M-mode): 2.8 cm LEFT ATRIUM             Index       RIGHT ATRIUM           Index LA diam:        3.20 cm 2.02 cm/m  RA Area:     11.10 cm LA Vol (A2C):   26.1 ml 16.52 ml/m RA Volume:   24.30 ml  15.38 ml/m LA Vol (A4C):   19.0 ml 12.02 ml/m LA Biplane Vol: 22.2 ml 14.05 ml/m  AORTIC VALVE LVOT Vmax:   101.00 cm/s LVOT Vmean:  76.700 cm/s LVOT VTI:    0.242 m  AORTA Ao Root diam: 2.40 cm Ao Asc diam:  2.90 cm MITRAL  VALVE MV Area (PHT): 3.39 cm    SHUNTS MV Decel Time: 224 msec    Systemic VTI:  0.24 m MV E velocity: 84.40 cm/s  Systemic Diam: 1.60 cm MV A velocity: 93.40 cm/s MV E/A ratio:  0.90 Candee Furbish MD Electronically signed by Candee Furbish MD Signature Date/Time: 12/30/2019/1:47:28 PM    Final     Impression/Plan:  This visit was conducted via telephone to spare the patient unnecessary potential exposure in the healthcare setting during the current COVID-19 pandemic. 4. 74 yo woman with ER positive breast cancer with metastatic disease to the brain, bones and spine.  She appears to have recovered well from the effects of her recent brain radiation and is currently without complaints.  We  discussed the plan to obtain a restaging MRI brain in approximately 3 months to continue to monitor the skull metastases for any evidence of disease progression.  If the skull lesions appear stable at next scan, we can then resume brain MRI scans every 4 months.  Regarding her systemic disease, she will continue on her current chemotherapy regimen with routine follow-up for disease management under the care and direction of [name].    2. Bilateral femur pain with abnormal uptake on recent bone scan.  She has previously had palliative radiation to the left mid femur in 05/2016 but due to progressive pain and abnormalities noted on recent bone scan, we will evaluate further with plain film x-rays of the femurs bilaterally. I will plan to call her with these results as soon as they are available.  3. Spine metastases.  She has recovered well from the effects of her prior SRS treatments to T12 and L2 and is currently without complaints.  She continues to prefer to have this followed with systemic imaging under the care and direction of [name] and only repeat spine MRI if she becomes symptomatic.  We have previously discussed that CT imaging is not the ideal imaging modailty for evaluation of the spine but certainly understand her concerns for limiting the number of imaging studies that she is subjected to on a routine basis.  She understands and accepts the associated risks of missing a very small spinal lesion on CT.  Her recent CT C/A/P imaging from 07/02/19 showed continued disease stability with no new or progressive interval findings. The previously treated metastases at T12 and L2 appear stable and there are no new osseous lesions identified. She is a most reliable patient and will let us know if she becomes symptomatic in the spine so that we can more thoroughly evaluate with MRI if/when necessary.  She appears to have a good understanding of her overall disease and our recommendations and is in  agreement with the stated plan.  She knows to call at anytime in the interim with any questions or concerns.  Given current concerns for patient exposure during the COVID-19 pandemic, this encounter was conducted via telephone. The patient was notified in advance and was offered a [place] meeting to allow for face to face communication but unfortunately reported that she did not have the appropriate resources/technology to support such a visit and instead preferred to proceed with telephone consult. The patient has given verbal consent for this type of encounter. The time spent during this encounter was 25 minutes with approximately 50% of that time spent in counseling and coordination of care. The attendants for this meeting include Melenda Bielak PA-C, and patient, Heather [name]. During the encounter, Lenette Rau PA-C was located  at [facility] Radiation Oncology Department.  Patient, Heather [name] was located at home.   Nicholos Johns, PA-C

## 2020-01-14 ENCOUNTER — Telehealth: Payer: Self-pay | Admitting: *Deleted

## 2020-01-14 NOTE — Telephone Encounter (Signed)
CALLED PATIENT TO ASK ABOUT HAVING BILATERAL FEMUR X-RAYS, PATIENT STATED THAT SHE WOULD HAVE THIS DONE(01/15/20) TOMORROW-( 9 AM OR 9:30 AM, NOTIFIED RADIOLOGY @ [facility] , ASHLYN BRUNING TO CALL PATIENT WITH RESULTS, PATIENT VERIFIED UNDERSTANDING THIS

## 2020-01-15 ENCOUNTER — Ambulatory Visit (HOSPITAL_COMMUNITY)
Admission: RE | Admit: 2020-01-15 | Discharge: 2020-01-15 | Disposition: A | Discharge status: Home / Self Care | Payer: Medicare Other | Source: Ambulatory Visit | Attending: Urology | Admitting: Urology

## 2020-01-15 ENCOUNTER — Other Ambulatory Visit: Payer: Self-pay

## 2020-01-15 DIAGNOSIS — R948 Abnormal results of function studies of other organs and systems: Secondary | ICD-10-CM | POA: Diagnosis not present

## 2020-01-15 DIAGNOSIS — M79671 Pain in right foot: Secondary | ICD-10-CM | POA: Diagnosis not present

## 2020-01-15 DIAGNOSIS — C7951 Secondary malignant neoplasm of bone: Secondary | ICD-10-CM | POA: Insufficient documentation

## 2020-01-15 DIAGNOSIS — C50911 Malignant neoplasm of unspecified site of right female breast: Secondary | ICD-10-CM | POA: Insufficient documentation

## 2020-01-16 ENCOUNTER — Telehealth: Payer: Self-pay | Admitting: Urology

## 2020-01-16 NOTE — Telephone Encounter (Signed)
I spoke with the patient by phone this morning to review the results from her recent femur x-rays that were performed for further evaluation of the abnormal uptake on her most recent bone scan.  The x-rays do show metastatic lesions in both the right and left femoral shaft, potentially at risk for fracture if left untreated.  The left-sided lesion appears to be close in proximity to a previously treated metastatic lesion so Dr. [name] has recommended that we fuse her most recent imaging with our prior treatment planning images to confirm any overlap/retreatment of this area which will be taken into account when formulating her upcoming treatment plan.  She is very familiar with radiation but we did discuss the risks and benefits associated with palliative radiation to help reduce the risk of future fracture in these areas as well as help to manage her pain.  Dr. [name] has recommended a 2-week course of palliative radiation to both the right and left femoral lesions and she is in agreement with proceeding.  She has provided verbal consent today and we have scheduled her for CT SIM/treatment planning at 1:30 PM on Thursday, 01/23/2020 in anticipation of beginning her daily radiation treatments on Monday, 01/27/2020.  She appears to have a good understanding of our recommendations which are of palliative intent and is comfortable and in agreement with the stated plan.  She will sign formal written consent at the time of her treatment planning visit and a copy of this document will be placed in her medical record.  I will share our discussion with [name] and we will move forward with treatment planning accordingly.  Nicholos Johns, MMS, PA-C [place] at [place]: [phone]  Fax: [phone]

## 2020-01-20 ENCOUNTER — Telehealth: Payer: Self-pay | Admitting: Hematology and Oncology

## 2020-01-20 NOTE — Telephone Encounter (Signed)
Patient wanted to know if there are any other treatment options for her.  I left a voicemail for her to call us back so that I can discuss some options.

## 2020-01-23 ENCOUNTER — Ambulatory Visit: Admission: RE | Admit: 2020-01-23 | Payer: Medicare Other | Source: Ambulatory Visit | Admitting: Radiation Oncology

## 2020-01-23 ENCOUNTER — Other Ambulatory Visit: Payer: Self-pay

## 2020-01-23 DIAGNOSIS — C7951 Secondary malignant neoplasm of bone: Secondary | ICD-10-CM | POA: Diagnosis not present

## 2020-01-23 DIAGNOSIS — Z510 Encounter for antineoplastic radiation therapy: Secondary | ICD-10-CM | POA: Insufficient documentation

## 2020-01-24 DIAGNOSIS — C7951 Secondary malignant neoplasm of bone: Secondary | ICD-10-CM | POA: Diagnosis not present

## 2020-01-24 DIAGNOSIS — G620 Drug-induced polyneuropathy: Secondary | ICD-10-CM | POA: Diagnosis not present

## 2020-01-24 DIAGNOSIS — K219 Gastro-esophageal reflux disease without esophagitis: Secondary | ICD-10-CM | POA: Diagnosis not present

## 2020-01-24 DIAGNOSIS — R0781 Pleurodynia: Secondary | ICD-10-CM | POA: Diagnosis not present

## 2020-01-24 DIAGNOSIS — G9520 Unspecified cord compression: Secondary | ICD-10-CM | POA: Diagnosis not present

## 2020-01-24 DIAGNOSIS — C50411 Malignant neoplasm of upper-outer quadrant of right female breast: Secondary | ICD-10-CM | POA: Diagnosis not present

## 2020-01-27 ENCOUNTER — Encounter: Payer: Self-pay | Admitting: Radiation Oncology

## 2020-01-27 ENCOUNTER — Other Ambulatory Visit: Payer: Self-pay

## 2020-01-27 ENCOUNTER — Ambulatory Visit
Admission: RE | Admit: 2020-01-27 | Discharge: 2020-01-27 | Disposition: A | Discharge status: Home / Self Care | Payer: Medicare Other | Source: Ambulatory Visit | Attending: Radiation Oncology | Admitting: Radiation Oncology

## 2020-01-27 DIAGNOSIS — C7951 Secondary malignant neoplasm of bone: Secondary | ICD-10-CM | POA: Diagnosis not present

## 2020-01-27 DIAGNOSIS — Z510 Encounter for antineoplastic radiation therapy: Secondary | ICD-10-CM | POA: Diagnosis not present

## 2020-01-27 NOTE — Progress Notes (Signed)
Spent approximately 25 minutes with patient today at her request s/p treatment. Patient expresses great concern for "spread of cancer if Dr. [name] doesn't treat her left femur." Patient states, "I understand you can't retreat an area that has previously been treated." Patient explains that her pain is greater in her left hip and femur than her right. She explains her left hip/femur pain now radiates inward toward her groin. Patient reports the pain in her left femur/hip makes it difficulty to walk. Patient states, "if Dr. [name] doesn't tx the area on the left side, the cancer spreads, then it might keep me from walking."Patient questions if her xray shows worsening disease in previously treated areas.   Patient goes onto report new onset left low back pain after hearing a pop late Friday. She explains that her pain is less today after her husband massaged ointment and she applied a compression garment over the weekend. She questions if this pain is deferred from her hip or a new problem. Assured patient this RN would follow up with Dr. [name] and phone her with his responses. She verbalized understanding and appreciation for time spent.

## 2020-01-28 ENCOUNTER — Other Ambulatory Visit: Payer: Self-pay

## 2020-01-28 ENCOUNTER — Ambulatory Visit
Admission: RE | Admit: 2020-01-28 | Discharge: 2020-01-28 | Disposition: A | Discharge status: Home / Self Care | Payer: Medicare Other | Source: Ambulatory Visit | Attending: Radiation Oncology | Admitting: Radiation Oncology

## 2020-01-28 DIAGNOSIS — Z510 Encounter for antineoplastic radiation therapy: Secondary | ICD-10-CM | POA: Diagnosis not present

## 2020-01-28 DIAGNOSIS — C7951 Secondary malignant neoplasm of bone: Secondary | ICD-10-CM | POA: Diagnosis not present

## 2020-01-29 ENCOUNTER — Ambulatory Visit
Admission: RE | Admit: 2020-01-29 | Discharge: 2020-01-29 | Disposition: A | Discharge status: Home / Self Care | Payer: Medicare Other | Source: Ambulatory Visit | Attending: Radiation Oncology | Admitting: Radiation Oncology

## 2020-01-29 ENCOUNTER — Other Ambulatory Visit: Payer: Self-pay

## 2020-01-29 DIAGNOSIS — C7951 Secondary malignant neoplasm of bone: Secondary | ICD-10-CM | POA: Diagnosis not present

## 2020-01-29 DIAGNOSIS — Z510 Encounter for antineoplastic radiation therapy: Secondary | ICD-10-CM | POA: Diagnosis not present

## 2020-01-30 ENCOUNTER — Telehealth: Payer: Self-pay | Admitting: Radiation Oncology

## 2020-01-30 ENCOUNTER — Ambulatory Visit
Admission: RE | Admit: 2020-01-30 | Discharge: 2020-01-30 | Disposition: A | Discharge status: Home / Self Care | Payer: Medicare Other | Source: Ambulatory Visit | Attending: Radiation Oncology | Admitting: Radiation Oncology

## 2020-01-30 ENCOUNTER — Other Ambulatory Visit: Payer: Self-pay

## 2020-01-30 DIAGNOSIS — Z510 Encounter for antineoplastic radiation therapy: Secondary | ICD-10-CM | POA: Diagnosis not present

## 2020-01-30 DIAGNOSIS — C7951 Secondary malignant neoplasm of bone: Secondary | ICD-10-CM | POA: Diagnosis not present

## 2020-01-30 NOTE — Telephone Encounter (Signed)
Phoned patient to circle back from conversation Monday. Patient agrees that the questions she has can be addressed by Dr. [name] during her PUT encounter on Friday, September 24. Patient denies additional needs at this time.

## 2020-01-31 ENCOUNTER — Ambulatory Visit: Payer: Medicare Other | Admitting: Hematology and Oncology

## 2020-01-31 ENCOUNTER — Ambulatory Visit: Payer: Medicare Other

## 2020-01-31 ENCOUNTER — Ambulatory Visit
Admission: RE | Admit: 2020-01-31 | Discharge: 2020-01-31 | Disposition: A | Discharge status: Home / Self Care | Payer: Medicare Other | Source: Ambulatory Visit | Attending: Radiation Oncology | Admitting: Radiation Oncology

## 2020-01-31 ENCOUNTER — Other Ambulatory Visit: Payer: Medicare Other

## 2020-01-31 DIAGNOSIS — Z510 Encounter for antineoplastic radiation therapy: Secondary | ICD-10-CM | POA: Diagnosis not present

## 2020-01-31 DIAGNOSIS — C7951 Secondary malignant neoplasm of bone: Secondary | ICD-10-CM | POA: Diagnosis not present

## 2020-02-02 DIAGNOSIS — C7951 Secondary malignant neoplasm of bone: Secondary | ICD-10-CM | POA: Insufficient documentation

## 2020-02-02 NOTE — Progress Notes (Signed)
  Radiation Oncology         [phone] ________________________________  Name: Heather [name] MRN: [id number]  Date: 01/23/2020  DOB: December 12, 1945  SIMULATION AND TREATMENT PLANNING NOTE    ICD-10-CM   1. Bone metastasis ([place])  C79.51     DIAGNOSIS:  74 yo woman with stage IV breast cancer with bilateral femur metastases  NARRATIVE:  The patient was brought to the [facility].  Identity was confirmed.  All relevant records and images related to the planned course of therapy were reviewed.  The patient freely provided informed written consent to proceed with treatment after reviewing the details related to the planned course of therapy. The consent form was witnessed and verified by the simulation staff.  Then, the patient was set-up in a stable reproducible  supine position for radiation therapy.  CT images were obtained.  Surface markings were placed.  The CT images were loaded into the planning software.  Then the target and avoidance structures were contoured.  Treatment planning then occurred.  The radiation prescription was entered and confirmed.  Then, I designed and supervised the construction of a total of at least 5 medically necessary complex treatment devices including vacu-loc leg positioner and 4 MLC apertures to shield vital tissue in the set up of at least 2 isocenters..  I have requested : Isodose Plan.   SPECIAL TREATMENT PROCEDURE:  The planned course of therapy using radiation constitutes a special treatment procedure. Special care is required in the management of this patient for the following reasons. This treatment constitutes a Special Treatment Procedure for the following reason: [ Retreatment in a previously radiated area requiring careful monitoring of increased risk of toxicity due to overlap of previous treatment..  The special nature of the planned course of radiotherapy will require increased physician supervision and oversight to ensure patient's safety  with optimal treatment outcomes.  PLAN:  The patient will receive 30 Gy in 10 fractions to two metastatic lesions in the right femur, and possibly the one spot in the left femur pending composite planning of previous and current treatment.  ________________________________  Sheral Apley. [name], M.D.

## 2020-02-03 ENCOUNTER — Ambulatory Visit
Admission: RE | Admit: 2020-02-03 | Discharge: 2020-02-03 | Disposition: A | Discharge status: Home / Self Care | Payer: Medicare Other | Source: Ambulatory Visit | Attending: Radiation Oncology | Admitting: Radiation Oncology

## 2020-02-03 ENCOUNTER — Telehealth: Payer: Self-pay

## 2020-02-03 DIAGNOSIS — Z510 Encounter for antineoplastic radiation therapy: Secondary | ICD-10-CM | POA: Diagnosis not present

## 2020-02-03 DIAGNOSIS — C7951 Secondary malignant neoplasm of bone: Secondary | ICD-10-CM | POA: Diagnosis not present

## 2020-02-03 NOTE — Telephone Encounter (Signed)
Sukaina called to express her interest in [place] counseling. I told her that while I do not have specific experience in [place] counseling, I can work with her and apply her religious beliefs to her treatment.    Gaylyn Rong Counseling Intern

## 2020-02-04 ENCOUNTER — Ambulatory Visit
Admission: RE | Admit: 2020-02-04 | Discharge: 2020-02-04 | Disposition: A | Discharge status: Home / Self Care | Payer: Medicare Other | Source: Ambulatory Visit | Attending: Radiation Oncology | Admitting: Radiation Oncology

## 2020-02-04 ENCOUNTER — Telehealth: Payer: Self-pay

## 2020-02-04 DIAGNOSIS — C7951 Secondary malignant neoplasm of bone: Secondary | ICD-10-CM | POA: Diagnosis not present

## 2020-02-04 DIAGNOSIS — E785 Hyperlipidemia, unspecified: Secondary | ICD-10-CM | POA: Diagnosis not present

## 2020-02-04 DIAGNOSIS — Z510 Encounter for antineoplastic radiation therapy: Secondary | ICD-10-CM | POA: Diagnosis not present

## 2020-02-04 DIAGNOSIS — C50411 Malignant neoplasm of upper-outer quadrant of right female breast: Secondary | ICD-10-CM | POA: Diagnosis not present

## 2020-02-04 DIAGNOSIS — I10 Essential (primary) hypertension: Secondary | ICD-10-CM | POA: Diagnosis not present

## 2020-02-04 NOTE — Telephone Encounter (Signed)
Called to reschedule next session.  Gaylyn Rong Counseling Intern

## 2020-02-04 NOTE — Progress Notes (Signed)
[place] Patient and [facility] Counseling Note    Session began with filling out and explaining the forms. After this, counselor and client spent session discussing client's medical history, her family and support system, and some of the cancer-related stressors she is experiencing. Client presented in a calm mood with an appropriate to context affect. Client seems to have accepted her own mortality, but anxiety around what will happen after she's gone is affecting her. With client's named dislike of confusion, it is also likely that uncertainty is also a source of stress for the client. The next session will focus on identifying client's stressors, frustrations, and sources of anxiety and engaging in problem solving to see how client can reduce these negative experiences.      Gaylyn Rong Counseling Intern

## 2020-02-05 ENCOUNTER — Other Ambulatory Visit: Payer: Self-pay

## 2020-02-05 ENCOUNTER — Ambulatory Visit
Admission: RE | Admit: 2020-02-05 | Discharge: 2020-02-05 | Disposition: A | Discharge status: Home / Self Care | Payer: Medicare Other | Source: Ambulatory Visit | Attending: Radiation Oncology | Admitting: Radiation Oncology

## 2020-02-05 DIAGNOSIS — C7951 Secondary malignant neoplasm of bone: Secondary | ICD-10-CM | POA: Diagnosis not present

## 2020-02-05 DIAGNOSIS — Z510 Encounter for antineoplastic radiation therapy: Secondary | ICD-10-CM | POA: Diagnosis not present

## 2020-02-06 ENCOUNTER — Ambulatory Visit
Admission: RE | Admit: 2020-02-06 | Discharge: 2020-02-06 | Disposition: A | Discharge status: Home / Self Care | Payer: Medicare Other | Source: Ambulatory Visit | Attending: Radiation Oncology | Admitting: Radiation Oncology

## 2020-02-06 DIAGNOSIS — C7951 Secondary malignant neoplasm of bone: Secondary | ICD-10-CM | POA: Diagnosis not present

## 2020-02-06 DIAGNOSIS — Z510 Encounter for antineoplastic radiation therapy: Secondary | ICD-10-CM | POA: Diagnosis not present

## 2020-02-07 ENCOUNTER — Inpatient Hospital Stay: Payer: Medicare Other

## 2020-02-07 ENCOUNTER — Other Ambulatory Visit: Payer: Self-pay

## 2020-02-07 ENCOUNTER — Inpatient Hospital Stay (HOSPITAL_BASED_OUTPATIENT_CLINIC_OR_DEPARTMENT_OTHER): Payer: Medicare Other | Admitting: Hematology and Oncology

## 2020-02-07 ENCOUNTER — Telehealth: Payer: Self-pay

## 2020-02-07 ENCOUNTER — Ambulatory Visit
Admission: RE | Admit: 2020-02-07 | Discharge: 2020-02-07 | Disposition: A | Discharge status: Home / Self Care | Payer: Medicare Other | Source: Ambulatory Visit | Attending: Radiation Oncology | Admitting: Radiation Oncology

## 2020-02-07 ENCOUNTER — Inpatient Hospital Stay: Payer: Medicare Other | Attending: Hematology and Oncology

## 2020-02-07 ENCOUNTER — Telehealth: Payer: Self-pay | Admitting: Pharmacist

## 2020-02-07 ENCOUNTER — Encounter: Payer: Self-pay | Admitting: Radiation Oncology

## 2020-02-07 DIAGNOSIS — G9529 Other cord compression: Secondary | ICD-10-CM

## 2020-02-07 DIAGNOSIS — Z79811 Long term (current) use of aromatase inhibitors: Secondary | ICD-10-CM | POA: Diagnosis not present

## 2020-02-07 DIAGNOSIS — Z23 Encounter for immunization: Secondary | ICD-10-CM | POA: Diagnosis not present

## 2020-02-07 DIAGNOSIS — Z170 Estrogen receptor positive status [ER+]: Secondary | ICD-10-CM | POA: Diagnosis not present

## 2020-02-07 DIAGNOSIS — C7951 Secondary malignant neoplasm of bone: Secondary | ICD-10-CM | POA: Insufficient documentation

## 2020-02-07 DIAGNOSIS — C50411 Malignant neoplasm of upper-outer quadrant of right female breast: Secondary | ICD-10-CM | POA: Diagnosis not present

## 2020-02-07 DIAGNOSIS — Z79899 Other long term (current) drug therapy: Secondary | ICD-10-CM | POA: Diagnosis not present

## 2020-02-07 DIAGNOSIS — C50911 Malignant neoplasm of unspecified site of right female breast: Secondary | ICD-10-CM

## 2020-02-07 DIAGNOSIS — Z923 Personal history of irradiation: Secondary | ICD-10-CM | POA: Diagnosis not present

## 2020-02-07 DIAGNOSIS — Z95828 Presence of other vascular implants and grafts: Secondary | ICD-10-CM

## 2020-02-07 DIAGNOSIS — R530 Neoplastic (malignant) related fatigue: Secondary | ICD-10-CM | POA: Insufficient documentation

## 2020-02-07 DIAGNOSIS — C7931 Secondary malignant neoplasm of brain: Secondary | ICD-10-CM | POA: Diagnosis not present

## 2020-02-07 DIAGNOSIS — Z7189 Other specified counseling: Secondary | ICD-10-CM

## 2020-02-07 DIAGNOSIS — Z9221 Personal history of antineoplastic chemotherapy: Secondary | ICD-10-CM | POA: Insufficient documentation

## 2020-02-07 LAB — CBC WITH DIFFERENTIAL (CANCER CENTER ONLY)
Abs Immature Granulocytes: 0.01 10*3/uL (ref 0.00–0.07)
Basophils Absolute: 0 10*3/uL (ref 0.0–0.1)
Basophils Relative: 1 %
Eosinophils Absolute: 0.1 10*3/uL (ref 0.0–0.5)
Eosinophils Relative: 3 %
HCT: 39.7 % (ref 36.0–46.0)
Hemoglobin: 13.5 g/dL (ref 12.0–15.0)
Immature Granulocytes: 0 %
Lymphocytes Relative: 15 %
Lymphs Abs: 0.4 10*3/uL — ABNORMAL LOW (ref 0.7–4.0)
MCH: 30.5 pg (ref 26.0–34.0)
MCHC: 34 g/dL (ref 30.0–36.0)
MCV: 89.6 fL (ref 80.0–100.0)
Monocytes Absolute: 0.3 10*3/uL (ref 0.1–1.0)
Monocytes Relative: 11 %
Neutro Abs: 1.7 10*3/uL (ref 1.7–7.7)
Neutrophils Relative %: 70 %
Platelet Count: 74 10*3/uL — ABNORMAL LOW (ref 150–400)
RBC: 4.43 MIL/uL (ref 3.87–5.11)
RDW: 14.2 % (ref 11.5–15.5)
WBC Count: 2.4 10*3/uL — ABNORMAL LOW (ref 4.0–10.5)
nRBC: 0 % (ref 0.0–0.2)

## 2020-02-07 LAB — CMP (CANCER CENTER ONLY)
ALT: 22 U/L (ref 0–44)
AST: 35 U/L (ref 15–41)
Albumin: 3.7 g/dL (ref 3.5–5.0)
Alkaline Phosphatase: 125 U/L (ref 38–126)
Anion gap: 9 (ref 5–15)
BUN: 16 mg/dL (ref 8–23)
CO2: 26 mmol/L (ref 22–32)
Calcium: 10.4 mg/dL — ABNORMAL HIGH (ref 8.9–10.3)
Chloride: 104 mmol/L (ref 98–111)
Creatinine: 0.84 mg/dL (ref 0.44–1.00)
GFR, Est AFR Am: >60 mL/min (ref >60)
GFR, Estimated: >60 mL/min (ref >60)
Glucose, Bld: 96 mg/dL (ref 70–99)
Potassium: 3.9 mmol/L (ref 3.5–5.1)
Sodium: 139 mmol/L (ref 135–145)
Total Bilirubin: 1 mg/dL (ref 0.3–1.2)
Total Protein: 7.4 g/dL (ref 6.5–8.1)

## 2020-02-07 MED ORDER — INFLUENZA VAC A&B SA ADJ QUAD 0.5 ML IM PRSY
PREFILLED_SYRINGE | INTRAMUSCULAR | Status: AC
  Filled 2020-02-07: qty 0.5

## 2020-02-07 MED ORDER — ANASTROZOLE 1 MG PO TABS: 1.0000 mg | ORAL_TABLET | Freq: Every day | ORAL | 3 refills | Status: DC

## 2020-02-07 MED ORDER — CYANOCOBALAMIN 1000 MCG/ML IJ SOLN
1000.0000 ug | Freq: Once | INTRAMUSCULAR | Status: AC
  Administered 2020-02-07: 1000 ug via INTRAMUSCULAR

## 2020-02-07 MED ORDER — HEPARIN SOD (PORK) LOCK FLUSH 100 UNIT/ML IV SOLN
500.0000 [IU] | Freq: Once | INTRAVENOUS | Status: AC | PRN
  Administered 2020-02-07: 500 [IU]
  Filled 2020-02-07: qty 5

## 2020-02-07 MED ORDER — CYANOCOBALAMIN 1000 MCG/ML IJ SOLN
INTRAMUSCULAR | Status: AC
  Filled 2020-02-07: qty 1

## 2020-02-07 MED ORDER — INFLUENZA VAC A&B SA ADJ QUAD 0.5 ML IM PRSY
0.5000 mL | PREFILLED_SYRINGE | Freq: Once | INTRAMUSCULAR | Status: AC
  Administered 2020-02-07: 0.5 mL via INTRAMUSCULAR

## 2020-02-07 MED ORDER — SODIUM CHLORIDE 0.9% FLUSH
10.0000 mL | INTRAVENOUS | Status: DC | PRN
  Administered 2020-02-07: 10 mL via INTRAVENOUS
  Filled 2020-02-07: qty 10

## 2020-02-07 MED ORDER — SODIUM CHLORIDE 0.9% FLUSH
10.0000 mL | INTRAVENOUS | Status: DC | PRN
  Administered 2020-02-07: 10 mL
  Filled 2020-02-07: qty 10

## 2020-02-07 MED ORDER — LAPATINIB DITOSYLATE 250 MG PO TABS: 1000.0000 mg | ORAL_TABLET | Freq: Every day | ORAL | 3 refills | Status: DC

## 2020-02-07 NOTE — Telephone Encounter (Signed)
Oral Oncology Patient Advocate Encounter  Received notification from [facility] that prior authorization for [place]ker[place] is required.  PA submitted on CoverMyMeds [place] [facility] Status is pending  Oral Oncology Clinic will continue to follow.  [place] Patient [place] Phone [phone] Fax [phone] 02/07/2020 10:19 AM

## 2020-02-07 NOTE — Patient Instructions (Signed)

## 2020-02-07 NOTE — Assessment & Plan Note (Signed)
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 MRI 02/14/2015: T5 large destructive lesion with pathologic compression fracture and extensive epidural tumor, involvement of T4, excision metastatic carcinoma ER 60%, PR 0%, HER-2 positive ratio 2.71 average copy #7.45 03/27/2015: T4-6 decompression surgery for spinal cord compression PET-CT 12/10/17Rt para-spinal mass mildy hypermetabolic SUV 5.9, lytic cortical lesion 4.8 cm lesion left prox femur suv 8.8 favor osseus met disease Echo 04/12/2017 shows well preserved ejection fraction of 55-60%  Treatment summary: 1. Resumed anastrozole 05/21/2015, but it has caused significant hair loss so we switched her to exemestane 25 mg once daily 07/23/2015 2. Herceptin every 3 week started 04/10/2015 3. Bone metastases: Zometa every 3 months 4. Radiation therapy to the spine (Dr. [name]) 04/13/2015 to 05/19/2015 5. Radiation therapy to left femur 04/28/2016 to 05/12/2016  6. SRS to the brain lesion 11/24/2016 7.Kadcyla started 05/05/2017 discontinued 07/27/2017 8.Palbociclib +Faslodex +trastuzumab+ Pertuzumabstarted 08/10/2017, stopped 04/04/2018 9. Enhertu started 06/01/18 10SRS to brain January 2021 -------------------------------------------------------------------------------------------------------------------------- XRT to T123/04/2019 PET CT scan 10/03/2018:stable, improvement in subcutaneous mets   Current treatment:Cycle19day 1Enhertu (Fam-trastuzumab-Durextecan)currently being given every 5 weeks will be moving to every 6 weeks Echo7/2020shows EF of 55-60%. Completed every 6 months.   CT CAP 01/01/2020: Small prevascular lymph nodes measuring 8 mm suspicious for early nodal recurrence, stable bone  metastases Bone scan 01/01/2020: Bone metastases right eighth rib, L2, indeterminate focus left and right femur Brain MRI 01/02/2020: Progression of metastatic disease to the right anterior temporal bone.  Progression of intracranial extension of the tumor lateral to the right temporal bone  Discussed different treatment options including lapatinib with Xeloda and Herceptin, Margituximab.

## 2020-02-07 NOTE — Progress Notes (Signed)
Patient Care Team: [name], MD as PCP - General (Family Medicine)  DIAGNOSIS:    ICD-10-CM   1. Primary cancer of upper outer quadrant of right female breast ([place])  C50.411     SUMMARY OF ONCOLOGIC HISTORY: Oncology History  Primary cancer of upper outer quadrant of right female breast ([place])  03/08/2012 Initial Diagnosis   Right breast invasive ductal carcinoma ER positive PR negative HER-2 positive Ki-67 44%; another breast mass biopsied in the anterior part of the breast which was also positive for malignancy that was HER-2 negative   03/14/2012 Cancer Staging   Staging form: Breast, AJCC 7th Edition - Pathologic: Stage IV (M1) - Signed by [name], NP on 06/22/2018   05/10/2012 Surgery   Right mastectomy and axillary lymph node dissection: Multifocal disease 5 cm, 1.7 cm, 1.6 cm, ER positive PR negative HER-2 positive Ki-67 44%, 3/7 lymph nodes positive   06/14/2012 - 06/12/2013 Chemotherapy   Adjuvant chemotherapy with Pierce 6 followed by Herceptin maintenance   10/25/2012 - 12/11/2012 Radiation Therapy   Adjuvant radiation therapy   02/07/2013 -  Anti-estrogen oral therapy   Letrozole 2.5 mg daily   02/14/2015 Imaging   MRI spine: Large destructive T5 lesion with severe pathologic compression fracture and extensive epidural tumor, severe spinal stenosis with moderate cord compression, tumor involvement of T4   03/18/2015 PET scan   Residual enhancing soft tissue adjacent to the spinal cord. No evidence of metastatic disease. Nonspecific uptake the left nipple   03/27/2015 - 03/30/2015 Hospital Admission   T4-6 decompression for spinal cord compression (lower extremity paralysis)   04/10/2015 -  Chemotherapy   Palliative treatment with Herceptin every 3 weeks with letrozole 2.5 mg daily   04/13/2015 - 05/19/2015 Radiation Therapy   Palliative radiation treatment to the spine   09/01/2015 Imaging   CT chest abdomen pelvis: Pathologic fracture with posterior  fusion at T5 no other evidence of metastatic disease in the chest abdomen pelvis   12/23/2015 Imaging   CT CAP: new nonspecific 0.6 cm lymph node was stated mediastinum needs follow-up CT, innumerable tiny groundglass pulmonary nodules throughout both lungs unchanged, patchy consolidation from radiation, T5 fracture, no mets   04/15/2016 PET scan   Rt para-spinal mass mildy hypermetabolic SUV 5.9, lytic cortical lesion 4.8 cm lesion left prox femur suv 8.8 favor osseus met disease   04/28/2016 - 05/12/2016 Radiation Therapy   Palliative XRT Left femur   05/24/2016 Procedure   Soft tissue mass biopsy back: Metastatic breast cancer ER 80%, PR 0%, Ki-67 50%, HER-2 positive ratio 2.25   06/09/2016 - 03/29/2017 Anti-estrogen oral therapy   Faslodex with Herceptin and Perjeta every 4 weeks   06/13/2016 - 06/15/2016 Hospital Admission   uncomplicated revision of previous thoracic instrumentation.   10/10/2016 PET scan   Interval development of new hypermetabolic 2.5 x 1.1 cm lesion posterior right eighth rib consistent with metastatic disease, stable right paraspinal lesion at T8, clustered soft tissue nodules in the posterior midline back near cervicothoracic junction smaller than before    11/03/2016 - 11/07/2016 Radiation Therapy   Palliative radiation to right eighth rib   11/04/2016 Imaging   Solitary contrast-enhancing lesion in the right cerebellum 6 x 2 mm concerning for metastatic lesion   11/24/2016 - 11/24/2016 Radiation Therapy   SRS to the brain lesion   04/18/2017 PET scan   New ill-defined rounded airspace opacity in posterior right lower lobe. Differential diagnosis includes malignancy and infectious/inflammatory process.Increased hypermetabolic lytic lesion  in T8 vertebral body, consistent with bone metastasis.  Increased hypermetabolic posterior upper chest wall subcutaneous nodules, highly suspicious metastatic disease. Further improvement in right posterior eighth rib metastasis.    05/04/2017 - 07/27/2017 Chemotherapy   Kadcyla every 3 weeks palliative chemotherapy stopped for progression   07/26/2017 Imaging   Interval increase in size of the multiple enhancing masses posteriorly overlying the spine, interval development of left axillary and mediastinal adenopathy, increase in the size of lytic lesions in T8-T9 and left ninth rib, increased groundglass opacities right lower lobe lung   07/27/2017 - 05/11/2018 Chemotherapy   Palbociclib + Faslodex + trastuzumab + Pertuzumab     04/03/2018 PET scan   PET CT scan showed progression of disease not only in the bones but also in the subcutaneous nodules.   05/15/2018 - 05/25/2018 Radiation Therapy   Palliative radiation to the back   06/01/2018 -  Chemotherapy   Enhertu (Transtuzumab Deruxtecan)    08/07/2018 - 08/07/2018 Radiation Therapy   T12 vertebral body SRS   Carcinoma of right breast, stage 4 (HCC)  02/14/2015 Initial Diagnosis   Breast cancer metastasized to bone, right (Heather [name])   06/01/2018 -  Chemotherapy   The patient had palonosetron (ALOXI) injection 0.25 mg, 0.25 mg, Intravenous,  Once, 24 of 30 cycles Administration: 0.25 mg (06/01/2018), 0.25 mg (06/22/2018), 0.25 mg (08/24/2018), 0.25 mg (07/13/2018), 0.25 mg (08/03/2018), 0.25 mg (09/14/2018), 0.25 mg (10/05/2018), 0.25 mg (11/16/2018), 0.25 mg (10/26/2018), 0.25 mg (12/07/2018), 0.25 mg (12/28/2018), 0.25 mg (01/18/2019), 0.25 mg (03/15/2019), 0.25 mg (02/15/2019), 0.25 mg (04/12/2019), 0.25 mg (05/09/2019), 0.25 mg (06/07/2019), 0.25 mg (07/05/2019), 0.25 mg (08/09/2019), 0.25 mg (09/13/2019), 0.25 mg (10/18/2019), 0.25 mg (11/22/2019), 0.25 mg (12/27/2019) fam-trastuzumab deruxtecan-nxki (ENHERTU) 332 mg in dextrose 5 % 100 mL chemo infusion, 5.4 mg/kg = 332 mg, Intravenous,  Once, 24 of 30 cycles Dose modification: 4.4 mg/kg (original dose 5.4 mg/kg, Cycle 7, Reason: Provider Judgment), 3.2 mg/kg (original dose 5.4 mg/kg, Cycle 9, Reason: Dose not tolerated), 150 mg (original dose 5.4  mg/kg, Cycle 18, Reason: Dose not tolerated) Administration: 332 mg (06/01/2018), 332 mg (06/22/2018), 332 mg (07/13/2018), 332 mg (08/24/2018), 332 mg (08/03/2018), 332 mg (09/14/2018), 272 mg (10/05/2018), 200 mg (11/16/2018), 272 mg (10/26/2018), 200 mg (12/07/2018), 200 mg (12/28/2018), 200 mg (01/18/2019), 200 mg (03/15/2019), 200 mg (02/15/2019), 200 mg (04/12/2019), 200 mg (05/09/2019), 200 mg (06/07/2019), 150 mg (07/05/2019), 150 mg (08/09/2019), 150 mg (09/13/2019), 150 mg (10/18/2019), 150 mg (11/22/2019), 150 mg (12/27/2019)  for chemotherapy treatment.      CHIEF COMPLIANT: Follow-up of metastatic breast cancer  INTERVAL HISTORY: Heather [name] is a 74 y.o. with above-mentioned history of metastatic breast cancercurrently onchemotherapy with trastuzumab-Deruxtecan (Enhertu)every6weeks. She presents to the clinic today for treatment. She has just completed radiation treatment to her hip and thigh.  She is here to discuss change in treatment from Enhertu to a different treatment.  She is due for a B12 injection for fatigue.  ALLERGIES:  is allergic to other, acyclovir and related, chlorhexidine, zanaflex [tizanidine hcl], codeine, keflex [cephalexin], naprosyn [naproxen], oxycodone, tramadol, and tussin [guaifenesin].  MEDICATIONS:  Current Outpatient Medications  Medication Sig Dispense Refill  . ALPRAZolam (XANAX) 0.25 MG tablet Take 1 tablet by mouth as needed.    Marland Kitchen anastrozole (ARIMIDEX) 1 MG tablet Take 1 tablet (1 mg total) by mouth daily. 90 tablet 3  . BOOSTRIX 5-2.5-18.5 LF-MCG/0.5 injection     . Cholecalciferol (VITAMIN D) 2000 UNITS tablet Take 2,000 Units by mouth daily.    Marland Kitchen  cycloSPORINE (RESTASIS) 0.05 % ophthalmic emulsion 1 drop 2 (two) times daily. (Patient not taking: Reported on 01/07/2020)    . fluticasone (FLONASE) 50 MCG/ACT nasal spray     . gabapentin (NEURONTIN) 600 MG tablet Take 1 tablet (600 mg total) by mouth as directed. Take 1 tablet in the AM and afternoon and 1 1/2 tabs  at bedtime 315 tablet 2  . HYDROcodone-acetaminophen (NORCO/VICODIN) 5-325 MG tablet Take 1 tablet by mouth every 6 (six) hours as needed for moderate pain. (Patient not taking: Reported on 01/07/2020) 30 tablet 0  . lapatinib (TYKERB) 250 MG tablet Take 4 tablets (1,000 mg total) by mouth daily. Take on an empty stomach, at least 1 hour before or 1 hour after meals. 120 tablet 3  . lidocaine-prilocaine (EMLA) cream Apply to affected area once 30 g 3  . omeprazole (PRILOSEC) 40 MG capsule TAKE 1 CAPSULE BY MOUTH ONCE DAILY FOR 30 DAYS  5  . ondansetron (ZOFRAN) 8 MG tablet Take 1 tablet (8 mg total) by mouth every 8 (eight) hours as needed for nausea or vomiting. 20 tablet 0  . OVER THE COUNTER MEDICATION Place 1 drop into both eyes 2 (two) times daily as needed (dry eyes). Over the counter lubricating eye drops    . polyethylene glycol (MIRALAX / GLYCOLAX) packet Take 17 g by mouth daily as needed.    . polyethylene glycol powder (GLYCOLAX/MIRALAX) 17 GM/SCOOP powder Take by mouth.    . Probiotic Product (PROBIOTIC DAILY PO) Take 1 capsule by mouth daily.  (Patient not taking: Reported on 01/07/2020)    . prochlorperazine (COMPAZINE) 10 MG tablet Take 1 tablet (10 mg total) by mouth every 6 (six) hours as needed for nausea or vomiting. 60 tablet 3  . spironolactone (ALDACTONE) 25 MG tablet Take 25 mg by mouth daily.     No current facility-administered medications for this visit.   Facility-Administered Medications Ordered in Other Visits  Medication Dose Route Frequency Provider Last Rate Last Admin  . sodium chloride flush (NS) 0.9 % injection 10 mL  10 mL Intracatheter PRN [name], MD   10 mL at 02/07/20 1022    PHYSICAL EXAMINATION: ECOG PERFORMANCE STATUS: 1 - Symptomatic but completely ambulatory  Vitals:   02/07/20 0923  BP: (!) 148/65  Pulse: 71  Resp: 18  Temp: (!) 97.3 F (36.3 C)  SpO2: 98%   Filed Weights   02/07/20 0923  Weight: 127 lb 9.6 oz (57.9 kg)     LABORATORY DATA:  I have reviewed the data as listed CMP Latest Ref Rng & Units 02/07/2020 12/27/2019 11/22/2019  Glucose 70 - 99 mg/dL 96 97 102(H)  BUN 8 - 23 mg/dL _0 Creatinine 0.44 - 1.00 mg/dL 0.84 0.83 0.75  Sodium 135 - 145 mmol/L 139 136 139  Potassium 3.5 - 5.1 mmol/L 3.9 4.0 3.8  Chloride 98 - 111 mmol/L 104 101 104  CO2 22 - 32 mmol/L _1 Calcium 8.9 - 10.3 mg/dL 10.4(H) 10.9(H) 10.5(H)  Total Protein 6.5 - 8.1 g/dL 7.4 7.6 7.3  Total Bilirubin 0.3 - 1.2 mg/dL 1.0 1.0 1.3(H)  Alkaline Phos 38 - 126 U/L 125 136(H) 142(H)  AST 15 - 41 U/L 35 43(H) 44(H)  ALT 0 - 44 U/L 22 29 35    Lab Results  Component Value Date   WBC 2.4 (L) 02/07/2020   HGB 13.5 02/07/2020   HCT 39.7 02/07/2020   MCV 89.6 02/07/2020   PLT  74 (L) 02/07/2020   NEUTROABS 1.7 02/07/2020    ASSESSMENT & PLAN:  Primary cancer of upper outer quadrant of right female breast ([place]) Right breast invasive ductal carcinoma ER positive PR negative HER-2 positive Ki-67 44% multifocal disease 3/7 lymph nodes positive T2 N1 M0 stage IIB status post adjuvant chemotherapy with TCH followed by Herceptin maintenance, adjuvant radiation therapy and Letrozole. MRI 02/14/2015: T5 large destructive lesion with pathologic compression fracture and extensive epidural tumor, involvement of T4, excision metastatic carcinoma ER 60%, PR 0%, HER-2 positive ratio 2.71 average copy #7.45 03/27/2015: T4-6 decompression surgery for spinal cord compression PET-CT 12/10/17Rt para-spinal mass mildy hypermetabolic SUV 5.9, lytic cortical lesion 4.8 cm lesion left prox femur suv 8.8 favor osseus met disease Echo 04/12/2017 shows well preserved ejection fraction of 55-60%  Treatment summary: 1. Resumed anastrozole 05/21/2015, but it has caused significant hair loss so we switched her to exemestane 25 mg once daily 07/23/2015 2. Herceptin every 3 week started 04/10/2015 3. Bone metastases: Zometa every 3 months 4. Radiation  therapy to the spine (Dr. [name]) 04/13/2015 to 05/19/2015 5. Radiation therapy to left femur 04/28/2016 to 05/12/2016  6. SRS to the brain lesion 11/24/2016 7.Kadcyla started 05/05/2017 discontinued 07/27/2017 8.Palbociclib +Faslodex +trastuzumab+ Pertuzumabstarted 08/10/2017, stopped 04/04/2018 9. Enhertu started 06/01/18 10SRS to brain January 2021 -------------------------------------------------------------------------------------------------------------------------- XRT to T123/04/2019 PET CT scan 10/03/2018:stable, improvement in subcutaneous mets   Current treatment:Cycle19day 1Enhertu (Fam-trastuzumab-Durextecan)currently being given every 5 weeks will be moving to every 6 weeks Echo7/2020shows EF of 55-60%. Completed every 6 months.   CT CAP 01/01/2020: Small prevascular lymph nodes measuring 8 mm suspicious for early nodal recurrence, stable bone metastases Bone scan 01/01/2020: Bone metastases right eighth rib, L2, indeterminate focus left and right femur Brain MRI 01/02/2020: Progression of metastatic disease to the right anterior temporal bone.  Progression of intracranial extension of the tumor lateral to the right temporal bone  Discussed different treatment options including lapatinib with Xeloda and Herceptin, Margituximab. Finally we discussed lapatinib with anastrozole.  I suspect that we have not been treating the antiestrogen component of her metastatic breast cancer and therefore she is progressing in the months.  We will plan to initiate lapatinib with anastrozole and rescan her in 3 months.  She has a trip coming up and wants to go there and start lapatinib mid-October.  She will start anastrozole today.   We will cancel all infusions and she will come back in November for follow-up.  No orders of the defined types were placed in this encounter.  The patient has a good understanding of the overall plan. she agrees with it. she will call with any  problems that may develop before the next visit here.  Total time spent: 30 mins including face to face time and time spent for planning, charting and coordination of care  [name], MD 02/07/2020  I, Cloyde Reams Dorshimer, am acting as scribe for Dr. [name].  I have reviewed the above documentation for accuracy and completeness, and I agree with the above.

## 2020-02-07 NOTE — Telephone Encounter (Signed)
Oral Oncology Patient Advocate Encounter  Prior Authorization for Tykerb has been approved.    PA# [id number]  Effective dates: 02/07/20 through 08/07/20  Patients co-pay is $2054  Oral Oncology Clinic will continue to follow.   [place] Patient [place] Phone [phone] Fax [phone] 02/07/2020 12:52 PM

## 2020-02-07 NOTE — Telephone Encounter (Signed)
Oral Oncology Pharmacist Encounter  Received new prescription for Tykerb (lapatinib) for the treatment of ER positive/PR negative, HER2+ metastatic breast cancer in conjunction with anastrozole, planned duration until disease progression or unacceptable drug toxicity.  Prescription dose and frequency assessed for appropriateness. Appropriate for therapy initiation.   CMP and CBC w/ Diff from 02/07/20 assessed, noted WBC 2.4 K/uL and pltc of 74 K/uL - no dose adjustments required at this time. ECHO from 12/30/19 showed LVEF of 60-65%. OK for treatment initiation   Current medication list in Epic reviewed, no relevant/significant DDIs with Tykerb identified.  Evaluated chart and no patient barriers to medication adherence noted.   Prescription has been e-scribed to the [facility] for benefits analysis and approval.  Oral Oncology Clinic will continue to follow for insurance authorization, copayment issues, initial counseling and start date.  Rebecca Fanning, PharmD, BCPS Hematology/Oncology Clinical Pharmacist [place] [facility] [phone] 02/07/2020 10:58 AM      

## 2020-02-07 NOTE — Patient Instructions (Signed)

## 2020-02-11 ENCOUNTER — Telehealth: Payer: Self-pay

## 2020-02-11 NOTE — Telephone Encounter (Signed)
Called to remind her of counseling appointment for Friday.  Gaylyn Rong Counseling Intern

## 2020-02-13 ENCOUNTER — Telehealth: Payer: Self-pay

## 2020-02-13 NOTE — Telephone Encounter (Signed)
Oral Oncology Patient Advocate Encounter  Met patient in [place] to complete an application for [employer] [place] ([employer]) in an effort to reduce the patient's out of pocket expense for Tykerb to $0.    Application completed and faxed to [phone].   [employer] phone number for follow up is [phone].   This encounter will be updated until final determination.   [place] Patient [place] Phone [phone] Fax [phone] 02/13/2020 10:31 AM

## 2020-02-14 DIAGNOSIS — R6884 Jaw pain: Secondary | ICD-10-CM | POA: Diagnosis not present

## 2020-02-14 DIAGNOSIS — I10 Essential (primary) hypertension: Secondary | ICD-10-CM | POA: Diagnosis not present

## 2020-02-14 DIAGNOSIS — C7931 Secondary malignant neoplasm of brain: Secondary | ICD-10-CM | POA: Diagnosis not present

## 2020-02-14 DIAGNOSIS — H9201 Otalgia, right ear: Secondary | ICD-10-CM | POA: Diagnosis not present

## 2020-02-14 DIAGNOSIS — C7951 Secondary malignant neoplasm of bone: Secondary | ICD-10-CM | POA: Diagnosis not present

## 2020-02-14 DIAGNOSIS — E785 Hyperlipidemia, unspecified: Secondary | ICD-10-CM | POA: Diagnosis not present

## 2020-02-14 DIAGNOSIS — R42 Dizziness and giddiness: Secondary | ICD-10-CM | POA: Diagnosis not present

## 2020-02-14 DIAGNOSIS — C50411 Malignant neoplasm of upper-outer quadrant of right female breast: Secondary | ICD-10-CM | POA: Diagnosis not present

## 2020-02-14 NOTE — Telephone Encounter (Signed)
Oral Oncology Pharmacist Encounter  I spoke with patient for overview of: Tykerb (lapatinib) for the treatment of ER positive/PR negative, HER2+ metastatic breast cancer in conjunction with anastrozole, planned duration until disease progression or unacceptable drug toxicity.  Counseled patient on administration, dosing, side effects, monitoring, drug-food interactions, safe handling, storage, and disposal.  Patient will take Tykerb 233m tablets, 4 tablets (10058m by mouth once daily on an empty stomach, 1 hour before or 1 hour after a meal.   Patient knows to avoid grapefruit and grapefruit juice while on therapy with Tykerb.  Tykerb start date: tentatively starting 02/24/20  Adverse effects include but are not limited to: nausea, vomiting, diarrhea, prolonged QTc, rash, fatigue, and hand-foot syndrome.    Patient has anti-emetic on hand and knows to take it if nausea develops.   Patient will obtain anti diarrheal and alert the office of 4 or more loose stools above baseline.  Reviewed with patient importance of keeping a medication schedule and plan for any missed doses. No barriers to medication adherence identified. No psychosocial barriers to medication adherence identified.  Medication reconciliation performed and medication/allergy list updated.  Patient approved for manufacturer assistance through [employer] provided with their phone number ([phone]).   All questions answered.  Ms. CoVenturioiced understanding and appreciation.   Medication education handout placed in mail for patient. Patient knows to call the office with questions or concerns. Oral Chemotherapy Clinic phone number provided to patient.   ReLeron CroakPharmD, BCPS Hematology/Oncology Clinical Pharmacist [phone]/12/2019 1:57 PM

## 2020-02-14 NOTE — Progress Notes (Signed)
[place] Patient and [facility] Counseling Note    Session covered a variety of topics, including patient's emotions around new medication and her jaw pain. Hair loss as a cancer patient was discussed as a loss of identity and control, as hair is the "crown" for a woman. Frustrations toward self and others were discussed, as well as an upcoming family trip. Patient expressed the desire for "comfort" when feeling anxious, which was discussed. Breathing techniques were mentioned, but counselor introduced the [phone] technique for patient to try in times of distress. When discussing hard topics, patient would make less eye contact. Overall, patient presented with a stressed mood and normal affect. The complete lack of control that the patient is experiencing as a result of cancer has lead her to experience distress and anxiety. Next session will check in on the family trip and the use of her new skills.     Gaylyn Rong Counseling Intern

## 2020-02-14 NOTE — Telephone Encounter (Signed)
Patient is approved for Tykerb at no cost from [employer] 02/14/20-05/08/20.  [place] Patient [place] Phone [phone] Fax [phone] 02/14/2020 1:48 PM

## 2020-02-19 ENCOUNTER — Encounter: Payer: Self-pay | Admitting: Urology

## 2020-02-19 ENCOUNTER — Other Ambulatory Visit: Payer: Self-pay | Admitting: Radiation Therapy

## 2020-02-19 ENCOUNTER — Ambulatory Visit (INDEPENDENT_AMBULATORY_CARE_PROVIDER_SITE_OTHER): Payer: Medicare Other | Admitting: Otolaryngology

## 2020-02-19 NOTE — Progress Notes (Signed)
Heather [name] was recently seen with ENT at [facility] for evaluation of right sided jaw pain and tenderness with the bone under her right ear and across her right cheek. She said that this pain will come and go, but last week she woke up with swelling and pain more than usual.  She was not able to get in with an ENT here, so she got an appointment with someone at [facility] on 02/14/20 Heather Maple, PA-C). She is concerned about the worsening symptoms she has been experiencing, as they are affecting her ability to eat. We received a note from ENT questioning whether her symptoms might be associated with the progressive metastatic disease in the right anterior temporal bone with questionable involvement of the right temporal muscle.  In light of these symptoms, Dr. [name] plans to review her most recent MRI scan again at upcoming tumor board and I will reach out to Heather [name] thereafter to discuss recommendations.  Heather [name], MMS, PA-C [place] at [place]: [phone]  Fax: [phone]

## 2020-02-24 ENCOUNTER — Telehealth: Payer: Self-pay | Admitting: Pharmacist

## 2020-02-24 ENCOUNTER — Inpatient Hospital Stay: Payer: Medicare Other

## 2020-02-24 ENCOUNTER — Telehealth: Payer: Self-pay

## 2020-02-24 DIAGNOSIS — N390 Urinary tract infection, site not specified: Secondary | ICD-10-CM

## 2020-02-24 DIAGNOSIS — R52 Pain, unspecified: Secondary | ICD-10-CM

## 2020-02-24 DIAGNOSIS — C7951 Secondary malignant neoplasm of bone: Secondary | ICD-10-CM

## 2020-02-24 NOTE — Telephone Encounter (Signed)
Oral Chemotherapy Pharmacist Encounter   Spoke with patient today to follow up regarding patient's oral chemotherapy medication: Tykerb (lapatinib)  Original Start date of oral chemotherapy: 02/24/20  Returned patient's call regarding questions concerning Tykerb. Reeducated patient on administration, side effects, drug-food interactions, safe handling and storage.   Medication reconciliation performed and medication/allergy list updated. Medication list updated to include OTC medications patient stated she is taking. No significant DDIs noted between medications and Tykerb.   Patient expressed appreciation and understanding.   Patient knows to call the office with questions or concerns.  Leron Croak, PharmD, BCPS Hematology/Oncology Clinical Pharmacist [facility] [phone] 02/24/2020 4:28 PM

## 2020-02-24 NOTE — Telephone Encounter (Signed)
Called to schedule new session after patient took a trip.  Gaylyn Rong Counseling Intern

## 2020-02-25 ENCOUNTER — Telehealth: Payer: Self-pay

## 2020-02-25 NOTE — Telephone Encounter (Signed)
Called to confirm I received her message.  Gaylyn Rong Counseling Intern

## 2020-02-26 ENCOUNTER — Telehealth: Payer: Self-pay | Admitting: Urology

## 2020-02-28 ENCOUNTER — Other Ambulatory Visit: Payer: Self-pay

## 2020-02-28 ENCOUNTER — Ambulatory Visit
Admission: RE | Admit: 2020-02-28 | Discharge: 2020-02-28 | Disposition: A | Discharge status: Home / Self Care | Payer: Medicare Other | Source: Ambulatory Visit | Attending: Radiation Oncology | Admitting: Radiation Oncology

## 2020-02-28 VITALS — BP 114/71 | HR 72 | Temp 98.3°F | Resp 18 | Ht 63.0 in | Wt 129.4 lb

## 2020-02-28 DIAGNOSIS — C7951 Secondary malignant neoplasm of bone: Secondary | ICD-10-CM | POA: Diagnosis not present

## 2020-02-28 DIAGNOSIS — Z853 Personal history of malignant neoplasm of breast: Secondary | ICD-10-CM | POA: Diagnosis not present

## 2020-02-28 DIAGNOSIS — C7931 Secondary malignant neoplasm of brain: Secondary | ICD-10-CM | POA: Diagnosis not present

## 2020-02-28 MED ORDER — SODIUM CHLORIDE 0.9% FLUSH
10.0000 mL | INTRAVENOUS | Status: AC
  Administered 2020-02-28: 10 mL via INTRAVENOUS

## 2020-02-28 NOTE — Progress Notes (Addendum)
Has armband been applied?  Yes.    Does patient have an allergy to IV contrast dye?: No.   Has patient ever received premedication for IV contrast dye?: No.   Does patient take metformin?: No.  If patient does take metformin when was the last dose: n/a  Date of lab work: 02/07/2020 BUN: 16 CR: 0.84  IV site: antecubital left, condition no redness  Has IV site been added to flowsheet?  Yes.    BP 114/71 (BP Location: Left Arm, Patient Position: Sitting, Cuff Size: Normal)   Pulse 72   Temp 98.3 F (36.8 C)   Resp 18   Ht 5\' 3"  (1.6 m)   Wt 129 lb 6.4 oz (58.7 kg)   SpO2 98%   BMI 22.92 kg/m    Patient reports fatigue. Patient reports inability to do strenuous activity due to bilateral leg weakness. Patient ambulating with a cane today. Patient reports having to use a wheelchair [place]. Reports brain fog. Endorses taking Tykerb (4 pills per day) for five days. Reports taking anastrozole as directed for four days now.

## 2020-02-28 NOTE — Progress Notes (Signed)
[place] Patient and [facility] Counseling Note   Patient had been on a family vacation, so session began with medical updates and a description of the trip. Patient said that some days are harder than others and "today, I don't feel so strong." The discussion of the family trip lead to discussion around the importance of family. Patient has a fear of "disappointing" her family if she dies from her cancer. Patient also described memories and relationships with her brothers and husband. Lastly, patient described wanting to make new memories with her granddaughters. Patient was oriented times three and showed no signs of ideation (SI/HI/NSSI). Her mood was stressed with a normal affect. Patient is worried about how her death will affect her family and does not want to be the cause of their pain. Family is extremely important to her and the thought of hurting them brings up stress and sadness, which causes her to over think and ruminate on the subject. Next session will focus on connecting the fear of disappointment with the desire to make new memories.   Gaylyn Rong Counseling Intern

## 2020-03-03 ENCOUNTER — Telehealth: Payer: Self-pay

## 2020-03-03 NOTE — Telephone Encounter (Signed)
Called to remind and confirm about session on Friday,  Heather [name] Counseling Intern

## 2020-03-05 DIAGNOSIS — Z853 Personal history of malignant neoplasm of breast: Secondary | ICD-10-CM | POA: Diagnosis not present

## 2020-03-05 DIAGNOSIS — C7951 Secondary malignant neoplasm of bone: Secondary | ICD-10-CM | POA: Diagnosis not present

## 2020-03-05 DIAGNOSIS — C7931 Secondary malignant neoplasm of brain: Secondary | ICD-10-CM | POA: Diagnosis not present

## 2020-03-05 NOTE — Progress Notes (Signed)
  Radiation Oncology         [phone] ________________________________  Name: Heather [name] MRN: [id number]  Date: 02/28/2020  DOB: 04-15-1946  SIMULATION AND TREATMENT PLANNING NOTE    ICD-10-CM   1. Brain metastasis ([place])  C79.31   2. Bone metastasis ([place])  C79.51     DIAGNOSIS:  74 yo woman with a right temporal skull metastasis with brain and masticator invasion  NARRATIVE:  The patient was brought to the [facility].  Identity was confirmed.  All relevant records and images related to the planned course of therapy were reviewed.  The patient freely provided informed written consent to proceed with treatment after reviewing the details related to the planned course of therapy. The consent form was witnessed and verified by the simulation staff. Intravenous access was established for contrast administration. Then, the patient was set-up in a stable reproducible supine position for radiation therapy.  A relocatable thermoplastic stereotactic head frame was fabricated for precise immobilization.  CT images were obtained.  Surface markings were placed.  The CT images were loaded into the planning software and fused with the patient's targeting MRI scan.  Then the target and avoidance structures were contoured.  Treatment planning then occurred.  The radiation prescription was entered and confirmed.  I have requested 3D planning  I have requested a DVH of the following structures: Brain stem, brain, left eye, right eye, lenses, optic chiasm, target volumes, uninvolved brain, and normal tissue.    SPECIAL TREATMENT PROCEDURE:  The planned course of therapy using radiation constitutes a special treatment procedure. Special care is required in the management of this patient for the following reasons. This treatment constitutes a Special Treatment Procedure for the following reason: High dose per fraction requiring special monitoring for increased toxicities of treatment including  daily imaging.  The special nature of the planned course of radiotherapy will require increased physician supervision and oversight to ensure patient's safety with optimal treatment outcomes.  PLAN:  The patient will receive 25 Gy in 5 fractions.  ________________________________  Sheral Apley [name], M.D.

## 2020-03-06 ENCOUNTER — Ambulatory Visit: Payer: Medicare Other | Admitting: Adult Health

## 2020-03-06 ENCOUNTER — Ambulatory Visit: Payer: Medicare Other

## 2020-03-06 ENCOUNTER — Other Ambulatory Visit: Payer: Medicare Other

## 2020-03-06 DIAGNOSIS — I10 Essential (primary) hypertension: Secondary | ICD-10-CM | POA: Diagnosis not present

## 2020-03-06 NOTE — Progress Notes (Signed)
[place] Patient and [facility] Counseling Note   Session began with a check-in about patient's week, where she described her current experience with her medications and the fun times with friends she had over the past week. Session topics included her feelings, her new hobby of crocheting, and how to connect deeper with her family. Patient said maybe she "should be the one to make the connections" instead of waiting for her family to talk to her. Counselor also introduced to-do lists and writing reminders as a way to reduce distraction. Patient presented to session oriented times three and with a content mood. Her affect was congruent and she showed no signs of SI/HI/NSSI. Patient is experiencing sadness over not spending as much time as she would like to with her children and grandchildren. Additionally, her value of family brings up distress at the thought of hurting or disappointing them with her death. Next session will check in on how patient reached out or initiated contact with her family this week.      Gaylyn Rong Counseling Intern

## 2020-03-09 ENCOUNTER — Ambulatory Visit
Admission: RE | Admit: 2020-03-09 | Discharge: 2020-03-09 | Disposition: A | Discharge status: Home / Self Care | Payer: Medicare Other | Source: Ambulatory Visit | Attending: Radiation Oncology | Admitting: Radiation Oncology

## 2020-03-09 ENCOUNTER — Other Ambulatory Visit: Payer: Self-pay

## 2020-03-09 DIAGNOSIS — C7951 Secondary malignant neoplasm of bone: Secondary | ICD-10-CM | POA: Diagnosis not present

## 2020-03-09 DIAGNOSIS — C7931 Secondary malignant neoplasm of brain: Secondary | ICD-10-CM | POA: Diagnosis not present

## 2020-03-10 ENCOUNTER — Ambulatory Visit: Payer: Medicare Other

## 2020-03-10 ENCOUNTER — Telehealth: Payer: Self-pay

## 2020-03-10 NOTE — Telephone Encounter (Signed)
Called to remind about session on Friday.  Gaylyn Rong Counseling Intern

## 2020-03-11 ENCOUNTER — Ambulatory Visit
Admission: RE | Admit: 2020-03-11 | Discharge: 2020-03-11 | Disposition: A | Discharge status: Home / Self Care | Payer: Medicare Other | Source: Ambulatory Visit | Attending: Radiation Oncology | Admitting: Radiation Oncology

## 2020-03-11 ENCOUNTER — Ambulatory Visit: Payer: Medicare Other | Admitting: Urology

## 2020-03-11 ENCOUNTER — Other Ambulatory Visit: Payer: Self-pay

## 2020-03-11 DIAGNOSIS — C7951 Secondary malignant neoplasm of bone: Secondary | ICD-10-CM | POA: Diagnosis not present

## 2020-03-11 DIAGNOSIS — C7931 Secondary malignant neoplasm of brain: Secondary | ICD-10-CM | POA: Diagnosis not present

## 2020-03-12 ENCOUNTER — Ambulatory Visit: Payer: Medicare Other

## 2020-03-13 ENCOUNTER — Ambulatory Visit
Admission: RE | Admit: 2020-03-13 | Discharge: 2020-03-13 | Disposition: A | Discharge status: Home / Self Care | Payer: Medicare Other | Source: Ambulatory Visit | Attending: Radiation Oncology | Admitting: Radiation Oncology

## 2020-03-13 ENCOUNTER — Ambulatory Visit: Payer: Medicare Other

## 2020-03-13 DIAGNOSIS — C7951 Secondary malignant neoplasm of bone: Secondary | ICD-10-CM | POA: Diagnosis not present

## 2020-03-13 DIAGNOSIS — C7931 Secondary malignant neoplasm of brain: Secondary | ICD-10-CM | POA: Diagnosis not present
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
## 2020-03-17 ENCOUNTER — Ambulatory Visit
Admission: RE | Admit: 2020-03-17 | Discharge: 2020-03-17 | Disposition: A | Discharge status: Home / Self Care | Payer: Medicare Other | Source: Ambulatory Visit | Attending: Radiation Oncology | Admitting: Radiation Oncology

## 2020-03-17 ENCOUNTER — Telehealth: Payer: Self-pay

## 2020-03-17 DIAGNOSIS — C7951 Secondary malignant neoplasm of bone: Secondary | ICD-10-CM | POA: Diagnosis not present

## 2020-03-17 DIAGNOSIS — C7931 Secondary malignant neoplasm of brain: Secondary | ICD-10-CM | POA: Diagnosis not present

## 2020-03-17 NOTE — Telephone Encounter (Signed)
Called to remind patient of session scheduled for Friday 11/12. Moved to phone visit per patient request.

## 2020-03-18 NOTE — Progress Notes (Signed)
Patient Care Team: [name], MD as PCP - General (Family Medicine)  DIAGNOSIS:    ICD-10-CM   1. Primary cancer of upper outer quadrant of right female breast ([place])  C50.411 CBC with Differential ([place])    CMP ([place] only)    SUMMARY OF ONCOLOGIC HISTORY: Oncology History  Primary cancer of upper outer quadrant of right female breast ([place])  03/08/2012 Initial Diagnosis   Right breast invasive ductal carcinoma ER positive PR negative HER-2 positive Ki-67 44%; another breast mass biopsied in the anterior part of the breast which was also positive for malignancy that was HER-2 negative   03/14/2012 Cancer Staging   Staging form: Breast, AJCC 7th Edition - Pathologic: Stage IV (M1) - Signed by [name], NP on 06/22/2018   05/10/2012 Surgery   Right mastectomy and axillary lymph node dissection: Multifocal disease 5 cm, 1.7 cm, 1.6 cm, ER positive PR negative HER-2 positive Ki-67 44%, 3/7 lymph nodes positive   06/14/2012 - 06/12/2013 Chemotherapy   Adjuvant chemotherapy with [place] 6 followed by Herceptin maintenance   10/25/2012 - 12/11/2012 Radiation Therapy   Adjuvant radiation therapy   02/07/2013 -  Anti-estrogen oral therapy   Letrozole 2.5 mg daily   02/14/2015 Imaging   MRI spine: Large destructive T5 lesion with severe pathologic compression fracture and extensive epidural tumor, severe spinal stenosis with moderate cord compression, tumor involvement of T4   03/18/2015 PET scan   Residual enhancing soft tissue adjacent to the spinal cord. No evidence of metastatic disease. Nonspecific uptake the left nipple   03/27/2015 - 03/30/2015 Hospital Admission   T4-6 decompression for spinal cord compression (lower extremity paralysis)   04/10/2015 -  Chemotherapy   Palliative treatment with Herceptin every 3 weeks with letrozole 2.5 mg daily   04/13/2015 - 05/19/2015 Radiation Therapy   Palliative radiation treatment to the spine   09/01/2015  Imaging   CT chest abdomen pelvis: Pathologic fracture with posterior fusion at T5 no other evidence of metastatic disease in the chest abdomen pelvis   12/23/2015 Imaging   CT CAP: new nonspecific 0.6 cm lymph node was stated mediastinum needs follow-up CT, innumerable tiny groundglass pulmonary nodules throughout both lungs unchanged, patchy consolidation from radiation, T5 fracture, no mets   04/15/2016 PET scan   Rt para-spinal mass mildy hypermetabolic SUV 5.9, lytic cortical lesion 4.8 cm lesion left prox femur suv 8.8 favor osseus met disease   04/28/2016 - 05/12/2016 Radiation Therapy   Palliative XRT Left femur   05/24/2016 Procedure   Soft tissue mass biopsy back: Metastatic breast cancer ER 80%, PR 0%, Ki-67 50%, HER-2 positive ratio 2.25   06/09/2016 - 03/29/2017 Anti-estrogen oral therapy   Faslodex with Herceptin and Perjeta every 4 weeks   06/13/2016 - 06/15/2016 Hospital Admission   uncomplicated revision of previous thoracic instrumentation.   10/10/2016 PET scan   Interval development of new hypermetabolic 2.5 x 1.1 cm lesion posterior right eighth rib consistent with metastatic disease, stable right paraspinal lesion at T8, clustered soft tissue nodules in the posterior midline back near cervicothoracic junction smaller than before    11/03/2016 - 11/07/2016 Radiation Therapy   Palliative radiation to right eighth rib   11/04/2016 Imaging   Solitary contrast-enhancing lesion in the right cerebellum 6 x 2 mm concerning for metastatic lesion   11/24/2016 - 11/24/2016 Radiation Therapy   SRS to the brain lesion   04/18/2017 PET scan   New ill-defined rounded airspace opacity in posterior right  lower lobe. Differential diagnosis includes malignancy and infectious/inflammatory process.Increased hypermetabolic lytic lesion in T8 vertebral body, consistent with bone metastasis.  Increased hypermetabolic posterior upper chest wall subcutaneous nodules, highly suspicious metastatic  disease. Further improvement in right posterior eighth rib metastasis.   05/04/2017 - 07/27/2017 Chemotherapy   Kadcyla every 3 weeks palliative chemotherapy stopped for progression   07/26/2017 Imaging   Interval increase in size of the multiple enhancing masses posteriorly overlying the spine, interval development of left axillary and mediastinal adenopathy, increase in the size of lytic lesions in T8-T9 and left ninth rib, increased groundglass opacities right lower lobe lung   07/27/2017 - 05/11/2018 Chemotherapy   Palbociclib + Faslodex + trastuzumab + Pertuzumab     04/03/2018 PET scan   PET CT scan showed progression of disease not only in the bones but also in the subcutaneous nodules.   05/15/2018 - 05/25/2018 Radiation Therapy   Palliative radiation to the back   06/01/2018 -  Chemotherapy   Enhertu (Transtuzumab Deruxtecan)    08/07/2018 - 08/07/2018 Radiation Therapy   T12 vertebral body SRS   Carcinoma of right breast, stage 4 (H[facility])  02/14/2015 Initial Diagnosis   Breast cancer metastasized to bone, right ([place])   06/01/2018 - 02/07/2020 Chemotherapy   The patient had palonosetron (ALOXI) injection 0.25 mg, 0.25 mg, Intravenous,  Once, 24 of 30 cycles Administration: 0.25 mg (06/01/2018), 0.25 mg (06/22/2018), 0.25 mg (08/24/2018), 0.25 mg (07/13/2018), 0.25 mg (08/03/2018), 0.25 mg (09/14/2018), 0.25 mg (10/05/2018), 0.25 mg (11/16/2018), 0.25 mg (10/26/2018), 0.25 mg (12/07/2018), 0.25 mg (12/28/2018), 0.25 mg (01/18/2019), 0.25 mg (03/15/2019), 0.25 mg (02/15/2019), 0.25 mg (04/12/2019), 0.25 mg (05/09/2019), 0.25 mg (06/07/2019), 0.25 mg (07/05/2019), 0.25 mg (08/09/2019), 0.25 mg (09/13/2019), 0.25 mg (10/18/2019), 0.25 mg (11/22/2019), 0.25 mg (12/27/2019) fam-trastuzumab deruxtecan-nxki (ENHERTU) 332 mg in dextrose 5 % 100 mL chemo infusion, 5.4 mg/kg = 332 mg, Intravenous,  Once, 24 of 30 cycles Dose modification: 4.4 mg/kg (original dose 5.4 mg/kg, Cycle 7, Reason: Provider Judgment), 3.2 mg/kg  (original dose 5.4 mg/kg, Cycle 9, Reason: Dose not tolerated), 150 mg (original dose 5.4 mg/kg, Cycle 18, Reason: Dose not tolerated) Administration: 332 mg (06/01/2018), 332 mg (06/22/2018), 332 mg (07/13/2018), 332 mg (08/24/2018), 332 mg (08/03/2018), 332 mg (09/14/2018), 272 mg (10/05/2018), 200 mg (11/16/2018), 272 mg (10/26/2018), 200 mg (12/07/2018), 200 mg (12/28/2018), 200 mg (01/18/2019), 200 mg (03/15/2019), 200 mg (02/15/2019), 200 mg (04/12/2019), 200 mg (05/09/2019), 200 mg (06/07/2019), 150 mg (07/05/2019), 150 mg (08/09/2019), 150 mg (09/13/2019), 150 mg (10/18/2019), 150 mg (11/22/2019), 150 mg (12/27/2019)  for chemotherapy treatment.      CHIEF COMPLIANT: Follow-up of metastatic breast cancer  INTERVAL HISTORY: Heather [name] is a 74 y.o. with above-mentioned history of metastatic breast cancercurrently onchemotherapy with trastuzumab-Deruxtecan (Enhertu)every6weeks.She presentsto the clinic today for treatment.She reports complains of headaches since radiation is complete. She also feels dizzy intermittently. Her dizziness most of the time is when she gets up from sitting position but sometimes the dizziness last for long period of time. She is otherwise tolerating the oral therapy extremely well. Does not have any diarrhea. She does have a bit of a rash which is being controlled with moisturizers and Benadryl.  ALLERGIES:  is allergic to other, acyclovir and related, chlorhexidine, zanaflex [tizanidine hcl], codeine, keflex [cephalexin], naprosyn [naproxen], oxycodone, tramadol, and tussin [guaifenesin].  MEDICATIONS:  Current Outpatient Medications  Medication Sig Dispense Refill  . acetaminophen (TYLENOL) 500 MG tablet Take 500 mg by mouth every 6 (six) hours as needed.    Marland Kitchen  ALPRAZolam (XANAX) 0.25 MG tablet Take 1 tablet by mouth as needed.    Marland Kitchen anastrozole (ARIMIDEX) 1 MG tablet Take 1 tablet (1 mg total) by mouth daily. 90 tablet 3  . BOOSTRIX 5-2.5-18.5 LF-MCG/0.5 injection     .  Cholecalciferol (VITAMIN D) 2000 UNITS tablet Take 2,000 Units by mouth daily.    . COLLAGEN PO Take by mouth.    . Cranberry 250 MG CAPS Take by mouth.    . cycloSPORINE (RESTASIS) 0.05 % ophthalmic emulsion 1 drop 2 (two) times daily. (Patient not taking: Reported on 01/07/2020)    . fluticasone (FLONASE) 50 MCG/ACT nasal spray     . gabapentin (NEURONTIN) 600 MG tablet Take 1 tablet (600 mg total) by mouth as directed. Take 1 tablet in the AM and afternoon and 1 1/2 tabs at bedtime 315 tablet 2  . HYDROcodone-acetaminophen (NORCO/VICODIN) 5-325 MG tablet Take 1 tablet by mouth every 6 (six) hours as needed for moderate pain. (Patient not taking: Reported on 01/07/2020) 30 tablet 0  . lapatinib (TYKERB) 250 MG tablet Take 4 tablets (1,000 mg total) by mouth daily. Take on an empty stomach, at least 1 hour before or 1 hour after meals. 120 tablet 3  . lidocaine-prilocaine (EMLA) cream Apply to affected area once 30 g 3  . omeprazole (PRILOSEC) 40 MG capsule TAKE 1 CAPSULE BY MOUTH ONCE DAILY FOR 30 DAYS  5  . ondansetron (ZOFRAN) 8 MG tablet Take 1 tablet (8 mg total) by mouth every 8 (eight) hours as needed for nausea or vomiting. 20 tablet 0  . OVER THE COUNTER MEDICATION Place 1 drop into both eyes 2 (two) times daily as needed (dry eyes). Over the counter lubricating eye drops    . polyethylene glycol (MIRALAX / GLYCOLAX) packet Take 17 g by mouth daily as needed.    . polyethylene glycol powder (GLYCOLAX/MIRALAX) 17 GM/SCOOP powder Take by mouth.    . Probiotic Product (PROBIOTIC DAILY PO) Take 1 capsule by mouth daily.  (Patient not taking: Reported on 01/07/2020)    . prochlorperazine (COMPAZINE) 10 MG tablet Take 1 tablet (10 mg total) by mouth every 6 (six) hours as needed for nausea or vomiting. 60 tablet 3  . spironolactone (ALDACTONE) 25 MG tablet Take 25 mg by mouth daily.     No current facility-administered medications for this visit.   Facility-Administered Medications Ordered in  Other Visits  Medication Dose Route Frequency Provider Last Rate Last Admin  . cyanocobalamin ((VITAMIN B-12)) injection 1,000 mcg  1,000 mcg Intramuscular Q6 weeks [name], MD   1,000 mcg at 03/19/20 1110  . heparin lock flush 100 unit/mL  500 Units Intracatheter Once [name], MD      . sodium chloride flush (NS) 0.9 % injection 10 mL  10 mL Intracatheter Once [name], MD        PHYSICAL EXAMINATION: ECOG PERFORMANCE STATUS: 2 - Symptomatic, <50% confined to bed  Vitals:   03/19/20 1044  BP: (!) 154/65  Pulse: 75  Resp: 16  Temp: 98 F (36.7 C)  SpO2: 98%   Filed Weights   03/19/20 1044  Weight: 129 lb 14.4 oz (58.9 kg)    LABORATORY DATA:  I have reviewed the data as listed CMP Latest Ref Rng & Units 02/07/2020 12/27/2019 11/22/2019  Glucose 70 - 99 mg/dL 96 97 102(H)  BUN 8 - 23 mg/dL _0 Creatinine 0.44 - 1.00 mg/dL 0.84 0.83 0.75  Sodium 135 - 145 mmol/L  139 136 139  Potassium 3.5 - 5.1 mmol/L 3.9 4.0 3.8  Chloride 98 - 111 mmol/L 104 101 104  CO2 22 - 32 mmol/L _0 Calcium 8.9 - 10.3 mg/dL 10.4(H) 10.9(H) 10.5(H)  Total Protein 6.5 - 8.1 g/dL 7.4 7.6 7.3  Total Bilirubin 0.3 - 1.2 mg/dL 1.0 1.0 1.3(H)  Alkaline Phos 38 - 126 U/L 125 136(H) 142(H)  AST 15 - 41 U/L 35 43(H) 44(H)  ALT 0 - 44 U/L 22 29 35    Lab Results  Component Value Date   WBC 3.7 (L) 03/19/2020   HGB 14.1 03/19/2020   HCT 43.1 03/19/2020   MCV 89.6 03/19/2020   PLT 90 (L) 03/19/2020   NEUTROABS 2.8 03/19/2020    ASSESSMENT & PLAN:  Primary cancer of upper outer quadrant of right female breast ([place]) Right breast invasive ductal carcinoma ER positive PR negative HER-2 positive Ki-67 44% multifocal disease 3/7 lymph nodes positive T2 N1 M0 stage IIB status post adjuvant chemotherapy with TCH followed by Herceptin maintenance, adjuvant radiation therapy and Letrozole. MRI 02/14/2015: T5 large destructive lesion with pathologic compression fracture and extensive  epidural tumor, involvement of T4, excision metastatic carcinoma ER 60%, PR 0%, HER-2 positive ratio 2.71 average copy #7.45 03/27/2015: T4-6 decompression surgery for spinal cord compression PET-CT 12/10/17Rt para-spinal mass mildy hypermetabolic SUV 5.9, lytic cortical lesion 4.8 cm lesion left prox femur suv 8.8 favor osseus met disease Echo 04/12/2017 shows well preserved ejection fraction of 55-60%  Treatment summary: 1. Resumed anastrozole 05/21/2015, but it has caused significant hair loss so we switched her to exemestane 25 mg once daily 07/23/2015 2. Herceptin every 3 week started 04/10/2015 3. Bone metastases: Zometa every 3 months 4. Radiation therapy to the spine (Dr. [name]) 04/13/2015 to 05/19/2015 5. Radiation therapy to left femur 04/28/2016 to 05/12/2016  6. SRS to the brain lesion 11/24/2016 7.Kadcyla started 05/05/2017 discontinued 07/27/2017 8.Palbociclib +Faslodex +trastuzumab+ Pertuzumabstarted 08/10/2017, stopped 04/04/2018 9. XRT to T123/04/2019 10. Enhertu started 06/01/18-12/27/2019 stopped for progression 11SRS to brain January 2021 12.  Lapatinib with anastrozole -------------------------------------------------------------------------------------------------------------------------- Current treatment:Lapatinib with anastrozole.   CT CAP 01/01/2020: Small prevascular lymph nodes measuring 8 mm suspicious for early nodal recurrence, stable bone metastases Bone scan 01/01/2020: Bone metastases right eighth rib, L2, indeterminate focus left and right femur Brain MRI 01/02/2020: Progression of metastatic disease to the right anterior temporal bone.  Progression of intracranial extension of the tumor lateral to the right temporal bone  Toxicities: 1. Rash on her body: Very mild and is well controlled with Benadryl. It is accompanied by itching. Denies any constipation. Energy levels are improving.  Dizziness: Unclear etiology could be  orthostatic. Headaches: Status post radiation to the skull lesion.  Return to clinic in 2 months with scans.    Orders Placed This Encounter  Procedures  . CBC with Differential (Cancer Center Only)    Standing Status:   Future    Standing Expiration Date:   03/19/2021  . CMP ([place] only)    Standing Status:   Future    Standing Expiration Date:   03/19/2021   The patient has a good understanding of the overall plan. she agrees with it. she will call with any problems that may develop before the next visit here.  Total time spent: 30 mins including face to face time and time spent for planning, charting and coordination of care  [name]* 03/19/2020  I, Cloyde Reams Dorshimer, am acting as scribe for Dr. Loleta Dicker  Alfreida Steffenhagen.  I have reviewed the above documentation for accuracy and completeness, and I agree with the above.

## 2020-03-19 ENCOUNTER — Inpatient Hospital Stay: Payer: Medicare Other | Attending: Hematology and Oncology

## 2020-03-19 ENCOUNTER — Encounter: Payer: Self-pay | Admitting: Radiation Oncology

## 2020-03-19 ENCOUNTER — Inpatient Hospital Stay (HOSPITAL_BASED_OUTPATIENT_CLINIC_OR_DEPARTMENT_OTHER): Payer: Medicare Other | Admitting: Hematology and Oncology

## 2020-03-19 ENCOUNTER — Inpatient Hospital Stay: Payer: Medicare Other

## 2020-03-19 ENCOUNTER — Other Ambulatory Visit: Payer: Self-pay

## 2020-03-19 ENCOUNTER — Ambulatory Visit
Admission: RE | Admit: 2020-03-19 | Discharge: 2020-03-19 | Disposition: A | Discharge status: Home / Self Care | Payer: Medicare Other | Source: Ambulatory Visit | Attending: Radiation Oncology | Admitting: Radiation Oncology

## 2020-03-19 DIAGNOSIS — C50411 Malignant neoplasm of upper-outer quadrant of right female breast: Secondary | ICD-10-CM | POA: Insufficient documentation

## 2020-03-19 DIAGNOSIS — Z9221 Personal history of antineoplastic chemotherapy: Secondary | ICD-10-CM | POA: Insufficient documentation

## 2020-03-19 DIAGNOSIS — Z79811 Long term (current) use of aromatase inhibitors: Secondary | ICD-10-CM | POA: Diagnosis not present

## 2020-03-19 DIAGNOSIS — Z923 Personal history of irradiation: Secondary | ICD-10-CM | POA: Diagnosis not present

## 2020-03-19 DIAGNOSIS — Z170 Estrogen receptor positive status [ER+]: Secondary | ICD-10-CM | POA: Insufficient documentation

## 2020-03-19 DIAGNOSIS — C7931 Secondary malignant neoplasm of brain: Secondary | ICD-10-CM | POA: Insufficient documentation

## 2020-03-19 DIAGNOSIS — Z79899 Other long term (current) drug therapy: Secondary | ICD-10-CM | POA: Insufficient documentation

## 2020-03-19 DIAGNOSIS — C7951 Secondary malignant neoplasm of bone: Secondary | ICD-10-CM | POA: Diagnosis not present

## 2020-03-19 DIAGNOSIS — Z853 Personal history of malignant neoplasm of breast: Secondary | ICD-10-CM | POA: Diagnosis not present

## 2020-03-19 DIAGNOSIS — Z95828 Presence of other vascular implants and grafts: Secondary | ICD-10-CM

## 2020-03-19 LAB — CBC WITH DIFFERENTIAL (CANCER CENTER ONLY)
Abs Immature Granulocytes: 0.01 10*3/uL (ref 0.00–0.07)
Basophils Absolute: 0 10*3/uL (ref 0.0–0.1)
Basophils Relative: 1 %
Eosinophils Absolute: 0.1 10*3/uL (ref 0.0–0.5)
Eosinophils Relative: 2 %
HCT: 43.1 % (ref 36.0–46.0)
Hemoglobin: 14.1 g/dL (ref 12.0–15.0)
Immature Granulocytes: 0 %
Lymphocytes Relative: 13 %
Lymphs Abs: 0.5 10*3/uL — ABNORMAL LOW (ref 0.7–4.0)
MCH: 29.3 pg (ref 26.0–34.0)
MCHC: 32.7 g/dL (ref 30.0–36.0)
MCV: 89.6 fL (ref 80.0–100.0)
Monocytes Absolute: 0.3 10*3/uL (ref 0.1–1.0)
Monocytes Relative: 8 %
Neutro Abs: 2.8 10*3/uL (ref 1.7–7.7)
Neutrophils Relative %: 76 %
Platelet Count: 90 10*3/uL — ABNORMAL LOW (ref 150–400)
RBC: 4.81 MIL/uL (ref 3.87–5.11)
RDW: 13.6 % (ref 11.5–15.5)
WBC Count: 3.7 10*3/uL — ABNORMAL LOW (ref 4.0–10.5)
nRBC: 0 % (ref 0.0–0.2)

## 2020-03-19 LAB — CMP (CANCER CENTER ONLY)
ALT: 20 U/L (ref 0–44)
AST: 35 U/L (ref 15–41)
Albumin: 4 g/dL (ref 3.5–5.0)
Alkaline Phosphatase: 168 U/L — ABNORMAL HIGH (ref 38–126)
Anion gap: 11 (ref 5–15)
BUN: 15 mg/dL (ref 8–23)
CO2: 29 mmol/L (ref 22–32)
Calcium: 10.7 mg/dL — ABNORMAL HIGH (ref 8.9–10.3)
Chloride: 99 mmol/L (ref 98–111)
Creatinine: 0.93 mg/dL (ref 0.44–1.00)
GFR, Estimated: >60 mL/min (ref >60)
Glucose, Bld: 75 mg/dL (ref 70–99)
Potassium: 4 mmol/L (ref 3.5–5.1)
Sodium: 139 mmol/L (ref 135–145)
Total Bilirubin: 1.5 mg/dL — ABNORMAL HIGH (ref 0.3–1.2)
Total Protein: 8.2 g/dL — ABNORMAL HIGH (ref 6.5–8.1)

## 2020-03-19 MED ORDER — CYANOCOBALAMIN 1000 MCG/ML IJ SOLN
INTRAMUSCULAR | Status: AC
  Filled 2020-03-19: qty 1

## 2020-03-19 MED ORDER — SODIUM CHLORIDE 0.9% FLUSH
10.0000 mL | Freq: Once | INTRAVENOUS | Status: DC
  Filled 2020-03-19: qty 10

## 2020-03-19 MED ORDER — CYANOCOBALAMIN 1000 MCG/ML IJ SOLN
1000.0000 ug | INTRAMUSCULAR | Status: DC
  Administered 2020-03-19: 1000 ug via INTRAMUSCULAR

## 2020-03-19 MED ORDER — HEPARIN SOD (PORK) LOCK FLUSH 100 UNIT/ML IV SOLN
500.0000 [IU] | Freq: Once | INTRAVENOUS | Status: DC
  Filled 2020-03-19: qty 5

## 2020-03-19 NOTE — Assessment & Plan Note (Signed)
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 MRI 02/14/2015: T5 large destructive lesion with pathologic compression fracture and extensive epidural tumor, involvement of T4, excision metastatic carcinoma ER 60%, PR 0%, HER-2 positive ratio 2.71 average copy #7.45 03/27/2015: T4-6 decompression surgery for spinal cord compression PET-CT 12/10/17Rt para-spinal mass mildy hypermetabolic SUV 5.9, lytic cortical lesion 4.8 cm lesion left prox femur suv 8.8 favor osseus met disease Echo 04/12/2017 shows well preserved ejection fraction of 55-60%  Treatment summary: 1. Resumed anastrozole 05/21/2015, but it has caused significant hair loss so we switched her to exemestane 25 mg once daily 07/23/2015 2. Herceptin every 3 week started 04/10/2015 3. Bone metastases: Zometa every 3 months 4. Radiation therapy to the spine (Dr. [name]) 04/13/2015 to 05/19/2015 5. Radiation therapy to left femur 04/28/2016 to 05/12/2016  6. SRS to the brain lesion 11/24/2016 7.Kadcyla started 05/05/2017 discontinued 07/27/2017 8.Palbociclib +Faslodex +trastuzumab+ Pertuzumabstarted 08/10/2017, stopped 04/04/2018 9. XRT to T123/04/2019 10. Enhertu started 06/01/18-12/27/2019 stopped for progression 11SRS to brain January 2021 12.  Lapatinib with anastrozole -------------------------------------------------------------------------------------------------------------------------- Current treatment:Lapatinib with anastrozole.   CT CAP 01/01/2020: Small prevascular lymph nodes measuring 8 mm suspicious for early nodal recurrence, stable bone metastases Bone scan 01/01/2020: Bone metastases right eighth rib, L2, indeterminate focus left and right femur Brain MRI 01/02/2020: Progression of metastatic  disease to the right anterior temporal bone.  Progression of intracranial extension of the tumor lateral to the right temporal bone  Toxicities:  Return to clinic in 3 months with scans.

## 2020-03-19 NOTE — Progress Notes (Signed)
Received patient in the clinic following final radiation treatment. Vitals stable. Provided patient with one month follow up appointment card. Patient understands that [employer], PA-C will be calling her on the phone and she doesn't have to present to [facility] for the appointment. Patient ambulating with the aid of a cane today. Gait steady. Patient reports fatigue. Patient reports since starting radiation therapy "this last time" she had experienced a daily dull frontal headache 2 on a scale of 0-10. She reports this headache typically passes without intervention but on occasion she has had to take a Tylenol.  Attempted to reassure her this could be related to the radiation and should resolved with rest and time. Assured her this RN would inform Dr. [name] of these headaches. Patient denies any further neurologic symptoms. Patient scheduled to follow up with [place] today.

## 2020-03-20 ENCOUNTER — Telehealth: Payer: Self-pay | Admitting: Radiation Oncology

## 2020-03-20 ENCOUNTER — Ambulatory Visit: Payer: Medicare Other | Admitting: Hematology and Oncology

## 2020-03-20 ENCOUNTER — Ambulatory Visit: Payer: Medicare Other

## 2020-03-20 ENCOUNTER — Other Ambulatory Visit: Payer: Medicare Other

## 2020-03-20 ENCOUNTER — Telehealth: Payer: Self-pay | Admitting: Hematology and Oncology

## 2020-03-20 NOTE — Telephone Encounter (Signed)
-----   Message from [place], [place] sent at 03/19/2020  5:44 PM EST ----- Regarding: RE: Frontal headache I agree completely and if improved with Tylenol alone, I would not do anything different but if persistent or worsening, could consider adding steroids. Ailene Ards ----- Message ----- From: Heywood Footman, RN Sent: 03/19/2020   3:50 PM EST To: Tyler Pita, MD, Freeman Caldron, PA-C Subject: Frontal headache                               Received patient in the clinic following final radiation treatment. Vitals stable. Provided patient with one month follow up appointment card. Patient understands that [employer], PA-C will be calling her on the phone and she doesn't have to present to [facility] for the appointment. Patient ambulating with the aid of a cane today. Gait steady. Patient reports fatigue. Patient reports since starting radiation therapy "this last time" she had experienced a daily dull frontal headache 2 on a scale of 0-10. She reports this headache typically passes without intervention but on occasion she has had to take a Tylenol.  Attempted to reassure her this could be related to the radiation and should resolved with rest and time. Assured her this RN would inform Dr. [name] of these headaches. Patient denies any further neurologic symptoms. Patient scheduled to follow up with G[place] today.  Was I correct in my response? Are there additional things I can say to her to reassure her?  Sam

## 2020-03-20 NOTE — Telephone Encounter (Signed)
Called pt per 11/12 sch msg - no answer . Left message for patient to call back to reschedule.

## 2020-03-20 NOTE — Telephone Encounter (Signed)
Phoned patient. No answer. Left voicemail message explaining that Ashlyn Bruning, PA-C agrees there is no concern for the dull frontal headaches she is having. Attempted to reassure her that they should resolve with time and rest. Encouraged Tylenol if needed. Stressed if the headaches become more persistent or frequent in nature to call this RN back. Provided my directed number. Explained steroids may need to be considered if headaches become more intense or frequent in nature.

## 2020-03-20 NOTE — Progress Notes (Signed)
[place] Patient and [facility] Counseling Note   Session focused on patient's last week, with a lot of time spent on the "discombobulating" experience of yesterday. Patient experienced consuion, distress, and anxiety during treatment. Patient described other events from her week and then went to discussing her family. Counselor asked patient what she could do to heal herself from yesterday. Patient also described thinking of her coping skills during an anxiety attack. Patient was oriented times three and showed no ideation in session. Patient's mood was content with some stress and a congruent affect. Patient's previous day had caused her to be a little disoriented and kept changing topics, but felt better after talking about her day. Patient needs someone she can talk to without upsetting her loved ones and that is what counseling has been for her. Patient has lived longer than she initially thought she would, which has resulted in a gratitude for life. Next session will focus on living life to the fullest and checking in.   Gaylyn Rong Counseling Intern

## 2020-03-24 ENCOUNTER — Telehealth: Payer: Self-pay | Admitting: Hematology and Oncology

## 2020-03-24 ENCOUNTER — Telehealth: Payer: Self-pay

## 2020-03-24 NOTE — Telephone Encounter (Signed)
Scheduled per 11/11 los. Pt will receive an updated appt calendar per next appt notes

## 2020-03-24 NOTE — Telephone Encounter (Signed)
Called to remind about session scheduled for Friday 11/19.  Gaylyn Rong Counseling Intern

## 2020-03-27 ENCOUNTER — Inpatient Hospital Stay: Payer: Medicare Other

## 2020-03-27 ENCOUNTER — Encounter: Payer: Self-pay | Admitting: *Deleted

## 2020-03-27 ENCOUNTER — Telehealth: Payer: Self-pay | Admitting: Medical

## 2020-03-27 ENCOUNTER — Inpatient Hospital Stay (HOSPITAL_BASED_OUTPATIENT_CLINIC_OR_DEPARTMENT_OTHER): Payer: Medicare Other | Admitting: Medical

## 2020-03-27 ENCOUNTER — Ambulatory Visit (HOSPITAL_COMMUNITY)
Admission: RE | Admit: 2020-03-27 | Discharge: 2020-03-27 | Disposition: A | Discharge status: Home / Self Care | Payer: Medicare Other | Source: Ambulatory Visit | Attending: Medical | Admitting: Medical

## 2020-03-27 ENCOUNTER — Other Ambulatory Visit: Payer: Self-pay

## 2020-03-27 ENCOUNTER — Other Ambulatory Visit: Payer: Self-pay | Admitting: Hematology and Oncology

## 2020-03-27 VITALS — BP 152/75 | HR 84 | Temp 98.8°F | Resp 18 | Wt 130.4 lb

## 2020-03-27 DIAGNOSIS — C7951 Secondary malignant neoplasm of bone: Secondary | ICD-10-CM

## 2020-03-27 DIAGNOSIS — C50911 Malignant neoplasm of unspecified site of right female breast: Secondary | ICD-10-CM | POA: Diagnosis not present

## 2020-03-27 DIAGNOSIS — Z170 Estrogen receptor positive status [ER+]: Secondary | ICD-10-CM | POA: Diagnosis not present

## 2020-03-27 DIAGNOSIS — C7931 Secondary malignant neoplasm of brain: Secondary | ICD-10-CM

## 2020-03-27 DIAGNOSIS — R0781 Pleurodynia: Secondary | ICD-10-CM

## 2020-03-27 DIAGNOSIS — C50411 Malignant neoplasm of upper-outer quadrant of right female breast: Secondary | ICD-10-CM | POA: Diagnosis not present

## 2020-03-27 DIAGNOSIS — G9529 Other cord compression: Secondary | ICD-10-CM | POA: Diagnosis not present

## 2020-03-27 DIAGNOSIS — Z79811 Long term (current) use of aromatase inhibitors: Secondary | ICD-10-CM | POA: Diagnosis not present

## 2020-03-27 DIAGNOSIS — Z9221 Personal history of antineoplastic chemotherapy: Secondary | ICD-10-CM | POA: Diagnosis not present

## 2020-03-27 MED ORDER — HEPARIN SOD (PORK) LOCK FLUSH 100 UNIT/ML IV SOLN
500.0000 [IU] | Freq: Once | INTRAVENOUS | Status: AC
  Administered 2020-03-27: 500 [IU]
  Filled 2020-03-27: qty 5

## 2020-03-27 MED ORDER — SODIUM CHLORIDE 0.9% FLUSH
10.0000 mL | Freq: Once | INTRAVENOUS | Status: AC
  Administered 2020-03-27: 10 mL
  Filled 2020-03-27: qty 10

## 2020-03-27 NOTE — Progress Notes (Signed)
Pt stopped by the office with no scheduled apt wishing to see [name] with complaint of right sided rib cage pain x 2 days.  Unable to schedule pt to be seen by [name] but can be seen in [facility].  Scheduling notified to schedule apt.

## 2020-03-27 NOTE — Progress Notes (Signed)
[place] Patient and [facility] Counseling Note   Patient first discussed the fatigue and pain from her current tx, as well as her frustration with not being able to reschedule an appointment. Patient asked counselor about her perception of how patient was doing. This brought up discussion of comparing experience to others. The patient also briefly described some conflict in relationships and how taking chemo pills is a "daily reminder" of her cancer. Patient described not wanting to be a burden on her family or suffer. Patient described several incidents where she felt selfish or like she had said something she shouldn't, so counselor and patient collaborated on exploring that. Patient came to session out of breath, but regulated after a few moments. Patient was oriented times three, showed no ideation, and was more talkative than usual. Her mood was sad, contemplative, and even happy sometimes. Her affect was mostly appropriate to context, and patient cried some, sometimes joking when the topic turned too serious. Patient feels shame and guilt when she says something she perceives as something she 'shouldn't' have said. Patient is grateful that her experience is not worse, and she seems to not allow herself to acknowledge that her situation is still unfortunate. The expectations that the patient has for herself are causing some distress. Next session will focus on the guilt/shame she feels, how she wants to work on herself, and how she was influenced by her mother/grandmother.    Gaylyn Rong Counseling Intern

## 2020-03-27 NOTE — Telephone Encounter (Signed)
Scheduled appts per 11/19 secure chat. Pt went to registration to check in after appts were made.

## 2020-03-31 ENCOUNTER — Telehealth: Payer: Self-pay

## 2020-03-31 NOTE — Progress Notes (Signed)
Symptoms Management Clinic Progress Note   KALANA YUST [id number] 1945/07/09 74 y.o.  [name] is managed by Dr. [name]  Actively treated with chemotherapy/immunotherapy/hormonal therapy: yes  Current therapy: Anastrozole and Tykerb  Next scheduled appointment with provider: 05/21/2020  Assessment: Plan:    Rib pain on right side - Plan: DG Ribs Unilateral W/Chest Right  Spinal cord compression due to malignant neoplasm metastatic to spine ([place])  Brain metastasis (HCC)  Bone metastasis (HCC)  Carcinoma of right breast, stage 4 ([place])   Right rib pain: Patient was referred for a right rib x-ray which returned showing:  FINDINGS: Right Port-A-Cath is in place with the tip in the right atrium. Posterior spinal rods in place.  Lungs clear.  Heart is normal size. No effusions or pneumothorax.  No visible rib fracture or focal rib lesion.  IMPRESSION: No active cardiopulmonary disease.  She was reassured and was told to continue using Vicodin as needed.   Metastatic breast cancer with brain and bone metastasis with a history of a spinal cord compression: Ms. Geerdes continues to be followed by Dr. [name] and is currently treated with anastrozole and Tykerb.  She is scheduled to be seen in follow-up on 05/21/2020.   Please see After Visit Summary for patient specific instructions.  Future Appointments  Date Time Provider [place]  04/23/2020  1:30 PM Bruning, Ashlyn, PA-C [facility] None  05/19/2020  9:45 AM [facility] LAB [facility] None  05/19/2020 10:00 AM [facility] [place] FLUSH [facility] None  05/21/2020  9:45 AM [name], MD [facility] None    Orders Placed This Encounter  Procedures  . DG Ribs Unilateral W/Chest Right       Subjective:   Patient ID:  ALAUNA HAYDEN is a 74 y.o. (DOB 03/26/1946) female.  Chief Complaint: No chief complaint on file.   HPI KHADIJA THIER  is a 74 y.o. female with a diagnosis of a  metastatic breast cancer with brain and bone metastasis with a history of a spinal cord compression.  She is managed by Dr. [name] and is currently treated with anastrozole and Tykerb.  She presents to the clinic today with right rib pain since Monday.  She reports that she had 1 day with increased pain when she took deep breaths.  She has a history of a similar left rib pain which was not evaluated with an x-ray but resolved on its own without any intervention.  She denies trauma or changes in activity.  Medications: I have reviewed the patient's current medications.  Allergies:  Allergies  Allergen Reactions  . Other Itching    Chlorhexadine Cloth wipes  . Acyclovir And Related   . Chlorhexidine   . Zanaflex [Tizanidine Hcl] Hives    Bumps  . Codeine     " loopy".   02/26/15: Also patient does not want to take oxycodone either (makes her feel like a "zombie")  . Keflex [Cephalexin]     GI Upset  . Naprosyn [Naproxen] Other (See Comments)    GI ipset   . Oxycodone Other (See Comments)    Made her feel like a "zombie" 02/25/15. Patient does not want to take oxycodone.  . Tramadol Itching  . Tussin [Guaifenesin] Other (See Comments)    Dizzy     Past Medical History:  Diagnosis Date  . Anemia    hx of  . Anxiety    r/t updated surgery  . Breast cancer ([employer])    right  .  Cataract    immature-not sure of which eye  . Complication of anesthesia    pt states she is sensitive to meds  . Dizziness    has been going on for 26month;medical MD aware  . Genetic testing 07/11/2016   Test Results: Normal; No pathogenic mutations detected  Genes Analyzed: 43 genes on [employer]'s Common Cancers panel (APC, ATM, AXIN2, BARD1, BMPR1A, BRCA1, BRCA2, BRIP1, CDH1, CDKN2A, CHEK2, DICER1, EPCAM, GREM1, HOXB13, KIT, MEN1, MLH1, MSH2, MSH6, MUTYH, NBN, NF1, PALB2, PDGFRA, PMS2, POLD1, POLE, PTEN, RAD50, RAD51C, RAD51D, SDHA, SDHB, SDHC, SDHD, SMAD4, SMARCA4, STK11, TP53, TSC1, TSC2, VHL).  .Marland KitchenGERD  (gastroesophageal reflux disease)    Tums prn  . H. pylori infection   . H/O hiatal hernia   . Hemorrhoids   . History of UTI   . Hx of radiation therapy 10/25/12- 12/11/12   right chest wall/regional lymph nodes 5040 cGy, 28 sessions, right chest wall boost 1000 cGy 1 session  . Hx: UTI (urinary tract infection)   . Hyperlipidemia    but not on meds;diet and exercise controlled  . Hypertension    recently started Aldactone   . Insomnia    takes Melatoniin daily  . Metastasis to spinal column (HCC)    T5, Left  Femur cancer- radiation   . Neuropathy    "from back surgery"  . PONV (postoperative nausea and vomiting)    pt experienced hair loss, confusion and combative- 02/15/15  . Right knee pain   . Right shoulder pain   . Skin cancer    squamous, Nodule to back - "same cancer as breast."  . Urinary frequency    d/t taking Aldactone    Past Surgical History:  Procedure Laterality Date  . COLONOSCOPY    . cyst removed from left breast  1970  . DECOMPRESSIVE LUMBAR LAMINECTOMY LEVEL 4 N/A 02/15/2015   Procedure: DECOMPRESSION T5 AND T3-T7 STABALIZATION;  Surgeon: NConsuella Lose MD;  [facility];  Service: Neurosurgery;  Laterality: N/A;  . ESOPHAGOGASTRODUODENOSCOPY    . LAMINECTOMY N/A 03/27/2015   Procedure: Thoracic Four-Thoracic Six Laminectomy for tumor;  Surgeon: NConsuella Lose MD;  [facility];  Service: Neurosurgery;  Laterality: N/A;  T4-T6 Laminectomy  . left wrist surgery   2004   with plate  . MASTECTOMY MODIFIED RADICAL  05/10/2012   Procedure: MASTECTOMY MODIFIED RADICAL;  Surgeon: PMerrie Roof MD;  [place]  Service: General;  Laterality: Right;  RIGHT MODIFIED RADICAL MASTECTOMY  . MASTECTOMY, RADICAL Right   . MINOR BREAST BIOPSY Left 05/16/2016   Procedure: EXCISION OF BACK NODULE;  Surgeon: PAutumn MessingIII, MD;  [place]  Service: General;  Laterality: Left;  . Nodule  back  05/17/2015  .  PORTACATH PLACEMENT  05/10/2012   Procedure: INSERTION PORT-A-CATH;  Surgeon: PMerrie Roof MD;  [facility];  Service: General;  Laterality: Left;    Family History  Problem Relation Age of Onset  . Leukemia Mother 611      deceased 633 . Stroke Father   . Diabetes Mellitus II Father   . Prostate cancer Brother 778      currently 732 . Stomach cancer Maternal Grandmother 833      deceased 847 . Prostate cancer Brother 676      currently 764   Social History   Socioeconomic History  . Marital status: Married  Spouse name: Not on file  . Number of children: 2  . Years of education: Not on file  . Highest education level: Not on file  Occupational History  . Not on file  Tobacco Use  . Smoking status: Never Smoker  . Smokeless tobacco: Never Used  Vaping Use  . Vaping Use: Never used  Substance and Sexual Activity  . Alcohol use: No  . Drug use: No  . Sexual activity: Yes    Birth control/protection: Post-menopausal  Other Topics Concern  . Not on file  Social History Narrative  . Not on file   Social Determinants of Health   Financial Resource Strain:   . Difficulty of Paying Living Expenses: Not on file  Food Insecurity:   . Worried About Charity fundraiser in the Last Year: Not on file  . Ran Out of Food in the Last Year: Not on file  Transportation Needs:   . Lack of Transportation (Medical): Not on file  . Lack of Transportation (Non-Medical): Not on file  Physical Activity:   . Days of Exercise per Week: Not on file  . Minutes of Exercise per Session: Not on file  Stress:   . Feeling of Stress : Not on file  Social Connections:   . Frequency of Communication with Friends and Family: Not on file  . Frequency of Social Gatherings with Friends and Family: Not on file  . Attends Religious Services: Not on file  . Active Member of Clubs or Organizations: Not on file  . Attends Archivist Meetings: Not on file  . Marital Status: Not on  file  Intimate Partner Violence:   . Fear of Current or Ex-Partner: Not on file  . Emotionally Abused: Not on file  . Physically Abused: Not on file  . Sexually Abused: Not on file    Past Medical History, Surgical history, Social history, and Family history were reviewed and updated as appropriate.   Please see review of systems for further details on the patient's review from today.   Review of Systems:  Review of Systems  Constitutional: Negative for chills, diaphoresis and fever.  HENT: Negative for trouble swallowing and voice change.   Respiratory: Negative for cough, choking, chest tightness, shortness of breath, wheezing and stridor.   Cardiovascular: Positive for chest pain (Right inferior rib pain.). Negative for palpitations.  Gastrointestinal: Negative for abdominal pain, constipation, diarrhea, nausea and vomiting.  Musculoskeletal: Negative for back pain and myalgias.  Neurological: Negative for dizziness, light-headedness and headaches.    Objective:   Physical Exam:  BP (!) 152/75   Pulse 84   Temp 98.8 F (37.1 C) (Tympanic)   Resp 18   Wt 130 lb 6.4 oz (59.1 kg)   SpO2 98%   BMI 23.10 kg/m  ECOG: 1  Physical Exam Constitutional:      General: She is not in acute distress.    Appearance: She is not ill-appearing, toxic-appearing or diaphoretic.  HENT:     Head: Normocephalic and atraumatic.  Eyes:     General: No scleral icterus.       Right eye: No discharge.        Left eye: No discharge.     Conjunctiva/sclera: Conjunctivae normal.  Cardiovascular:     Rate and Rhythm: Normal rate and regular rhythm.     Heart sounds: No murmur heard.  No friction rub. No gallop.   Pulmonary:     Effort: Pulmonary effort is normal. No  respiratory distress.     Breath sounds: Normal breath sounds. No wheezing, rhonchi or rales.  Musculoskeletal:        General: Tenderness present. No deformity or signs of injury.       Arms:  Neurological:     Mental  Status: She is alert.     Lab Review:     Component Value Date/Time   NA 139 03/19/2020 1001   NA 137 05/05/2017 1046   K 4.0 03/19/2020 1001   K 3.9 05/05/2017 1046   CL 99 03/19/2020 1001   CL 101 10/25/2012 0946   CO2 29 03/19/2020 1001   CO2 26 05/05/2017 1046   GLUCOSE 75 03/19/2020 1001   GLUCOSE 88 05/05/2017 1046   GLUCOSE 90 10/25/2012 0946   BUN 15 03/19/2020 1001   BUN 14.1 05/05/2017 1046   CREATININE 0.93 03/19/2020 1001   CREATININE 0.8 05/05/2017 1046   CALCIUM 10.7 (H) 03/19/2020 1001   CALCIUM 10.3 05/05/2017 1046   PROT 8.2 (H) 03/19/2020 1001   PROT 7.2 05/05/2017 1046   ALBUMIN 4.0 03/19/2020 1001   ALBUMIN 3.7 05/05/2017 1046   AST 35 03/19/2020 1001   AST 26 05/05/2017 1046   ALT 20 03/19/2020 1001   ALT 17 05/05/2017 1046   ALKPHOS 168 (H) 03/19/2020 1001   ALKPHOS 74 05/05/2017 1046   BILITOT 1.5 (H) 03/19/2020 1001   BILITOT 0.43 05/05/2017 1046   GFRNONAA >60 03/19/2020 1001   GFRAA >60 02/07/2020 0913       Component Value Date/Time   WBC 3.7 (L) 03/19/2020 1001   WBC 6.2 07/27/2017 0836   RBC 4.81 03/19/2020 1001   HGB 14.1 03/19/2020 1001   HGB 12.0 05/05/2017 1046   HCT 43.1 03/19/2020 1001   HCT 36.9 05/05/2017 1046   PLT 90 (L) 03/19/2020 1001   PLT 210 05/05/2017 1046   MCV 89.6 03/19/2020 1001   MCV 83.1 05/05/2017 1046   MCH 29.3 03/19/2020 1001   MCHC 32.7 03/19/2020 1001   RDW 13.6 03/19/2020 1001   RDW 14.8 (H) 05/05/2017 1046   LYMPHSABS 0.5 (L) 03/19/2020 1001   LYMPHSABS 0.6 (L) 05/05/2017 1046   MONOABS 0.3 03/19/2020 1001   MONOABS 0.3 05/05/2017 1046   EOSABS 0.1 03/19/2020 1001   EOSABS 0.1 05/05/2017 1046   BASOSABS 0.0 03/19/2020 1001   BASOSABS 0.1 05/05/2017 1046   -------------------------------  Imaging from last 24 hours (if applicable):  Radiology interpretation: DG Ribs Unilateral W/Chest Right  Result Date: 03/30/2020 CLINICAL DATA:  Right rib pain EXAM: RIGHT RIBS AND CHEST - 3+ VIEW  COMPARISON:  01/01/2020 FINDINGS: Right Port-A-Cath is in place with the tip in the right atrium. Posterior spinal rods in place. Lungs clear. Heart is normal size. No effusions or pneumothorax. No visible rib fracture or focal rib lesion. IMPRESSION: No active cardiopulmonary disease. Electronically Signed   By: Rolm Baptise M.D.   On: 03/30/2020 08:37

## 2020-04-07 DIAGNOSIS — I10 Essential (primary) hypertension: Secondary | ICD-10-CM | POA: Diagnosis not present

## 2020-04-10 ENCOUNTER — Other Ambulatory Visit: Payer: Self-pay | Admitting: *Deleted

## 2020-04-10 ENCOUNTER — Telehealth: Payer: Self-pay | Admitting: *Deleted

## 2020-04-10 DIAGNOSIS — C7951 Secondary malignant neoplasm of bone: Secondary | ICD-10-CM

## 2020-04-10 NOTE — Telephone Encounter (Signed)
Per MD review of pt's request regarding restaging of known right femur bone met with CT scheduled in January this RN entered order for CT of Right Femur.

## 2020-04-10 NOTE — Progress Notes (Signed)
[place] Patient and [facility] Counseling Note   Session covered a variety of topics and worked to build rapport and build the therapeutic relationship. Patient described both her good Thanksgiving and the stressful medical day she had after last session. Patient reflected on her [place] after the time she spent with them over the holiday, which counselor connected to previous session. Counselor asked what influences she saw from her mother and grandmother, which patient responded with about values and family dynamics. Counselor and patient also discussed education system and how it works. Patient was oriented times three and in a neutral mood. Her affect was congruent and showed no HI/SI/NSSI. Being in the middle of the holiday season has resulted in the patient spending a lot of time in reflection on her families of origin and creation. Thinking about those memories makes her feel wistful and leads her to engage in behaviors to either pass things on or further engage in her memories. Next session will focus on looking toward the future.     Gaylyn Rong Counseling Intern

## 2020-04-14 NOTE — Telephone Encounter (Signed)
Oral Oncology Patient Advocate Encounter  Met patient in [place] to complete a re-enrollment application for [employer] [facility] [place] (NPAF) in an effort to reduce the patient's out of pocket expense for Tykerb to $0.    Application completed and faxed to [phone].   NPAF phone number for follow up is [phone].   This encounter will be updated until final determination.   [place] Patient [place] Phone [phone] Fax [phone] 04/14/2020 2:29 PM

## 2020-04-15 ENCOUNTER — Other Ambulatory Visit: Payer: Self-pay

## 2020-04-15 ENCOUNTER — Telehealth: Payer: Self-pay

## 2020-04-15 ENCOUNTER — Encounter: Payer: Self-pay | Admitting: Urology

## 2020-04-15 NOTE — Telephone Encounter (Signed)
Called to remind patient of session tomorrow.  Gaylyn Rong Counseling Intern

## 2020-04-15 NOTE — Telephone Encounter (Signed)
Spoke with patient in regards to telephone follow-up with Freeman Caldron PA on 04/23/20 @ 1:30pm. Patient verbalized understanding of appointment date and time. Reviewed meaningful use questions TM

## 2020-04-16 NOTE — Progress Notes (Signed)
[place] Patient and [facility] Counseling Note   Session began by catching up a little and then the patient described some of her worries around her cancer, COVID, and being around others. Counselor offered reflections and active listening. Patient disclosed that her grandchild had told her and husband that they are non-binary. Counselor provided psycho-education and active listening. Patient also dicussed being angry at cancer and how that was different from before. Counselor suggested insights and offered reflections. Counselor and patient discussed ending sessions until January and reflected. Patient was oriented times three and showed no SI/HI/NSSI. Her mood was largely stressed and she had an appropriate to context affect. Patient is feeling overwhelmed and confused after different thoughts about her grandchild, her cancer, and other general stressors. Psychoeducation and listening helped patient understand and have fewer questions, which has reduced negative feelings. Next session will be in January and check in on how the patient is doing.     Gaylyn Rong Counseling Intern

## 2020-04-21 ENCOUNTER — Other Ambulatory Visit: Payer: Self-pay

## 2020-04-21 DIAGNOSIS — C7951 Secondary malignant neoplasm of bone: Secondary | ICD-10-CM

## 2020-04-21 DIAGNOSIS — C50411 Malignant neoplasm of upper-outer quadrant of right female breast: Secondary | ICD-10-CM

## 2020-04-21 DIAGNOSIS — C50911 Malignant neoplasm of unspecified site of right female breast: Secondary | ICD-10-CM

## 2020-04-21 NOTE — Progress Notes (Signed)
RN spoke with patient, patient following up to see if scan for legs have been scheduled.    Orders placed for CT of right femur on 12/3 - approved   RN notified patient of orders and patient has concerns regarding both legs.  Pt reports increase pain, limping, and weakness with both lower extremities.    Per MD - Cancel CT of femur, and obtain bone scan for assessment.  Awaiting authorization.

## 2020-04-22 ENCOUNTER — Telehealth: Payer: Self-pay

## 2020-04-22 DIAGNOSIS — I10 Essential (primary) hypertension: Secondary | ICD-10-CM | POA: Diagnosis not present

## 2020-04-22 DIAGNOSIS — C50411 Malignant neoplasm of upper-outer quadrant of right female breast: Secondary | ICD-10-CM | POA: Diagnosis not present

## 2020-04-22 DIAGNOSIS — K219 Gastro-esophageal reflux disease without esophagitis: Secondary | ICD-10-CM | POA: Diagnosis not present

## 2020-04-22 DIAGNOSIS — E785 Hyperlipidemia, unspecified: Secondary | ICD-10-CM | POA: Diagnosis not present

## 2020-04-22 NOTE — Telephone Encounter (Signed)
RN notified patient of MD recommendations for CT CAP and bone scan.   Authorization obtained.   Pt scheduled and notified of instructions.  Verbalized understanding and agreement.

## 2020-04-23 ENCOUNTER — Other Ambulatory Visit: Payer: Self-pay | Admitting: Radiation Therapy

## 2020-04-23 ENCOUNTER — Inpatient Hospital Stay
Admission: RE | Admit: 2020-04-23 | Discharge: 2020-04-23 | Disposition: A | Discharge status: Home / Self Care | Payer: Self-pay | Source: Ambulatory Visit | Attending: Urology | Admitting: Urology

## 2020-04-23 ENCOUNTER — Other Ambulatory Visit: Payer: Self-pay

## 2020-04-23 ENCOUNTER — Ambulatory Visit
Admission: RE | Admit: 2020-04-23 | Discharge: 2020-04-23 | Disposition: A | Discharge status: Home / Self Care | Payer: Medicare Other | Source: Ambulatory Visit | Attending: Radiation Oncology | Admitting: Radiation Oncology

## 2020-04-23 DIAGNOSIS — C7931 Secondary malignant neoplasm of brain: Secondary | ICD-10-CM

## 2020-04-23 DIAGNOSIS — C7949 Secondary malignant neoplasm of other parts of nervous system: Secondary | ICD-10-CM

## 2020-04-23 NOTE — Progress Notes (Signed)
Phoned patient as requested by Freeman Caldron, PA-C to follow up about current status. Patient noted to be 1 month s/p SRS right temporal skull metastasis with brain and masticator invasion. Patient reports the headaches she experienced s/p treatment resolved after a few day "as you told me they would." Patient reports the dizziness has greatly reduced. Reports feeling frustrated and discouraged about new pain that developed yesterday. She describes a burning and intermittently stinging pain that radiates from her arms, into her neck and down her back. Patient reports taking "a muscle relaxer yesterday didn't help." Patient goes onto explain that the gabapentin she took today and the application of ice seem to help a little. Reports her left hip pain has greatly improved but her left leg continues to have nagging pain.She reports both legs are weak and off balance. Patient reports using a rolling walker more than her cane to ambulate these days. Patient explains that going to the grocery store and hobby lobby for yarn wear her out completely. Reports tenderness continues under her ribs on both sides of her body. Patient denies nausea or vomiting. Reports vision is worse. Explains she is scheduled to follow up with her eye doctor in February. Patient states, "I think my vision is worse because of the cataract in my left eye." Denies alopecia or skin irritation at right temple. Confirms she continues anastrozole and tykerb. Scheduled for a bone scan and CT abd/pelvis on January 11th. Finally, patient reports a palpable raised "knot" that is tender at times and aligns with her left eyebrow. Explained this RN would inform her providers of these findings. Encouraged patient to phone with changes or needs. Explained she should expect to hear from Manuela Schwartz in February 2022 to schedule her next brain MRI. Patient verbalized understanding and expressed appreciation for the call.

## 2020-04-30 ENCOUNTER — Other Ambulatory Visit: Payer: Medicare Other

## 2020-04-30 ENCOUNTER — Ambulatory Visit: Payer: Medicare Other

## 2020-04-30 ENCOUNTER — Ambulatory Visit: Payer: Medicare Other | Admitting: Hematology and Oncology

## 2020-05-07 DIAGNOSIS — I10 Essential (primary) hypertension: Secondary | ICD-10-CM | POA: Diagnosis not present

## 2020-05-08 DIAGNOSIS — I10 Essential (primary) hypertension: Secondary | ICD-10-CM | POA: Diagnosis not present

## 2020-05-13 ENCOUNTER — Ambulatory Visit
Admission: RE | Admit: 2020-05-13 | Discharge: 2020-05-13 | Disposition: A | Discharge status: Home / Self Care | Payer: Medicare Other | Source: Ambulatory Visit | Attending: Radiation Oncology | Admitting: Radiation Oncology

## 2020-05-13 ENCOUNTER — Other Ambulatory Visit: Payer: Self-pay

## 2020-05-13 DIAGNOSIS — D492 Neoplasm of unspecified behavior of bone, soft tissue, and skin: Secondary | ICD-10-CM | POA: Diagnosis not present

## 2020-05-13 DIAGNOSIS — C7949 Secondary malignant neoplasm of other parts of nervous system: Secondary | ICD-10-CM

## 2020-05-13 DIAGNOSIS — C7951 Secondary malignant neoplasm of bone: Secondary | ICD-10-CM | POA: Diagnosis not present

## 2020-05-13 DIAGNOSIS — C7931 Secondary malignant neoplasm of brain: Secondary | ICD-10-CM

## 2020-05-13 DIAGNOSIS — R220 Localized swelling, mass and lump, head: Secondary | ICD-10-CM | POA: Diagnosis not present

## 2020-05-13 MED ORDER — GADOBENATE DIMEGLUMINE 529 MG/ML IV SOLN
12.0000 mL | Freq: Once | INTRAVENOUS | Status: AC | PRN
  Administered 2020-05-13: 12 mL via INTRAVENOUS

## 2020-05-18 NOTE — Telephone Encounter (Signed)
Patient is approved for Tykerb at no charge from [employer] 05/18/20-05/08/21.   [place] Patient [place] Phone [phone] Fax [phone] 05/18/2020 2:36 PM

## 2020-05-19 ENCOUNTER — Other Ambulatory Visit: Payer: Self-pay | Admitting: *Deleted

## 2020-05-19 ENCOUNTER — Ambulatory Visit (HOSPITAL_COMMUNITY): Admission: RE | Admit: 2020-05-19 | Payer: Medicare Other | Source: Ambulatory Visit

## 2020-05-19 ENCOUNTER — Ambulatory Visit (HOSPITAL_COMMUNITY)
Admission: RE | Admit: 2020-05-19 | Discharge: 2020-05-19 | Disposition: A | Discharge status: Home / Self Care | Payer: Medicare Other | Source: Ambulatory Visit | Attending: Hematology and Oncology | Admitting: Hematology and Oncology

## 2020-05-19 ENCOUNTER — Other Ambulatory Visit: Payer: Self-pay

## 2020-05-19 ENCOUNTER — Other Ambulatory Visit: Payer: Self-pay | Admitting: Hematology and Oncology

## 2020-05-19 ENCOUNTER — Inpatient Hospital Stay: Payer: Medicare Other

## 2020-05-19 ENCOUNTER — Inpatient Hospital Stay: Payer: Medicare Other | Attending: Hematology and Oncology

## 2020-05-19 ENCOUNTER — Encounter (HOSPITAL_COMMUNITY)
Admission: RE | Admit: 2020-05-19 | Discharge: 2020-05-19 | Disposition: A | Discharge status: Home / Self Care | Payer: Medicare Other | Source: Ambulatory Visit | Attending: Hematology and Oncology | Admitting: Hematology and Oncology

## 2020-05-19 ENCOUNTER — Encounter: Payer: Self-pay | Admitting: *Deleted

## 2020-05-19 ENCOUNTER — Encounter (HOSPITAL_COMMUNITY): Payer: Self-pay

## 2020-05-19 VITALS — BP 138/71 | HR 72 | Temp 97.9°F | Resp 18

## 2020-05-19 DIAGNOSIS — C50411 Malignant neoplasm of upper-outer quadrant of right female breast: Secondary | ICD-10-CM | POA: Insufficient documentation

## 2020-05-19 DIAGNOSIS — Z79811 Long term (current) use of aromatase inhibitors: Secondary | ICD-10-CM | POA: Diagnosis not present

## 2020-05-19 DIAGNOSIS — Z923 Personal history of irradiation: Secondary | ICD-10-CM | POA: Diagnosis not present

## 2020-05-19 DIAGNOSIS — C7951 Secondary malignant neoplasm of bone: Secondary | ICD-10-CM

## 2020-05-19 DIAGNOSIS — M25552 Pain in left hip: Secondary | ICD-10-CM | POA: Diagnosis not present

## 2020-05-19 DIAGNOSIS — Z79899 Other long term (current) drug therapy: Secondary | ICD-10-CM | POA: Insufficient documentation

## 2020-05-19 DIAGNOSIS — C50911 Malignant neoplasm of unspecified site of right female breast: Secondary | ICD-10-CM | POA: Diagnosis not present

## 2020-05-19 DIAGNOSIS — C7931 Secondary malignant neoplasm of brain: Secondary | ICD-10-CM | POA: Diagnosis not present

## 2020-05-19 DIAGNOSIS — M25551 Pain in right hip: Secondary | ICD-10-CM | POA: Diagnosis not present

## 2020-05-19 DIAGNOSIS — Z5112 Encounter for antineoplastic immunotherapy: Secondary | ICD-10-CM | POA: Insufficient documentation

## 2020-05-19 DIAGNOSIS — Z170 Estrogen receptor positive status [ER+]: Secondary | ICD-10-CM | POA: Insufficient documentation

## 2020-05-19 DIAGNOSIS — C50919 Malignant neoplasm of unspecified site of unspecified female breast: Secondary | ICD-10-CM | POA: Diagnosis not present

## 2020-05-19 DIAGNOSIS — Z853 Personal history of malignant neoplasm of breast: Secondary | ICD-10-CM | POA: Diagnosis not present

## 2020-05-19 DIAGNOSIS — M79604 Pain in right leg: Secondary | ICD-10-CM | POA: Diagnosis not present

## 2020-05-19 LAB — CBC WITH DIFFERENTIAL (CANCER CENTER ONLY)
Abs Immature Granulocytes: 0.01 10*3/uL (ref 0.00–0.07)
Basophils Absolute: 0 10*3/uL (ref 0.0–0.1)
Basophils Relative: 1 %
Eosinophils Absolute: 0 10*3/uL (ref 0.0–0.5)
Eosinophils Relative: 1 %
HCT: 40.2 % (ref 36.0–46.0)
Hemoglobin: 13.4 g/dL (ref 12.0–15.0)
Immature Granulocytes: 0 %
Lymphocytes Relative: 9 %
Lymphs Abs: 0.4 10*3/uL — ABNORMAL LOW (ref 0.7–4.0)
MCH: 28.9 pg (ref 26.0–34.0)
MCHC: 33.3 g/dL (ref 30.0–36.0)
MCV: 86.6 fL (ref 80.0–100.0)
Monocytes Absolute: 0.4 10*3/uL (ref 0.1–1.0)
Monocytes Relative: 9 %
Neutro Abs: 3.4 10*3/uL (ref 1.7–7.7)
Neutrophils Relative %: 80 %
Platelet Count: 109 10*3/uL — ABNORMAL LOW (ref 150–400)
RBC: 4.64 MIL/uL (ref 3.87–5.11)
RDW: 13.9 % (ref 11.5–15.5)
WBC Count: 4.3 10*3/uL (ref 4.0–10.5)
nRBC: 0 % (ref 0.0–0.2)

## 2020-05-19 LAB — CMP (CANCER CENTER ONLY)
ALT: 18 U/L (ref 0–44)
AST: 43 U/L — ABNORMAL HIGH (ref 15–41)
Albumin: 3.9 g/dL (ref 3.5–5.0)
Alkaline Phosphatase: 171 U/L — ABNORMAL HIGH (ref 38–126)
Anion gap: 9 (ref 5–15)
BUN: 18 mg/dL (ref 8–23)
CO2: 29 mmol/L (ref 22–32)
Calcium: 13.8 mg/dL (ref 8.9–10.3)
Chloride: 98 mmol/L (ref 98–111)
Creatinine: 1.16 mg/dL — ABNORMAL HIGH (ref 0.44–1.00)
GFR, Estimated: 49 mL/min — ABNORMAL LOW (ref >60)
Glucose, Bld: 101 mg/dL — ABNORMAL HIGH (ref 70–99)
Potassium: 3.5 mmol/L (ref 3.5–5.1)
Sodium: 136 mmol/L (ref 135–145)
Total Bilirubin: 1.4 mg/dL — ABNORMAL HIGH (ref 0.3–1.2)
Total Protein: 8.1 g/dL (ref 6.5–8.1)

## 2020-05-19 MED ORDER — ZOLEDRONIC ACID 4 MG/100ML IV SOLN
INTRAVENOUS | Status: AC
  Filled 2020-05-19: qty 100

## 2020-05-19 MED ORDER — HEPARIN SOD (PORK) LOCK FLUSH 100 UNIT/ML IV SOLN
INTRAVENOUS | Status: AC
  Filled 2020-05-19: qty 5

## 2020-05-19 MED ORDER — TECHNETIUM TC 99M MEDRONATE IV KIT
20.6000 | PACK | Freq: Once | INTRAVENOUS | Status: AC | PRN
  Administered 2020-05-19: 20.6 via INTRAVENOUS

## 2020-05-19 MED ORDER — SODIUM CHLORIDE 0.9 % IV SOLN
Freq: Once | INTRAVENOUS | Status: AC
  Filled 2020-05-19: qty 1000

## 2020-05-19 MED ORDER — IOHEXOL 300 MG/ML  SOLN
100.0000 mL | Freq: Once | INTRAMUSCULAR | Status: AC | PRN
  Administered 2020-05-19: 100 mL via INTRAVENOUS

## 2020-05-19 MED ORDER — IOHEXOL 9 MG/ML PO SOLN
ORAL | Status: AC
  Administered 2020-05-19: 500 mL via ORAL
  Filled 2020-05-19: qty 1000

## 2020-05-19 MED ORDER — SODIUM CHLORIDE 0.9 % IV SOLN
Freq: Once | INTRAVENOUS | Status: AC
  Filled 2020-05-19: qty 250

## 2020-05-19 MED ORDER — SODIUM CHLORIDE 0.9% FLUSH
10.0000 mL | Freq: Once | INTRAVENOUS | Status: AC
  Administered 2020-05-19: 10 mL
  Filled 2020-05-19: qty 10

## 2020-05-19 MED ORDER — ZOLEDRONIC ACID 4 MG/100ML IV SOLN
4.0000 mg | Freq: Once | INTRAVENOUS | Status: AC
  Administered 2020-05-19: 4 mg via INTRAVENOUS

## 2020-05-19 MED ORDER — CYANOCOBALAMIN 1000 MCG/ML IJ SOLN
INTRAMUSCULAR | Status: AC
  Filled 2020-05-19: qty 1

## 2020-05-19 MED ORDER — IOHEXOL 9 MG/ML PO SOLN
500.0000 mL | ORAL | Status: AC
Start: 2020-05-19 — End: 2020-05-19
  Administered 2020-05-19: 500 mL via ORAL

## 2020-05-19 MED ORDER — CYANOCOBALAMIN 1000 MCG/ML IJ SOLN
1000.0000 ug | INTRAMUSCULAR | Status: DC
  Administered 2020-05-19: 1000 ug via INTRAMUSCULAR

## 2020-05-19 NOTE — Progress Notes (Signed)
CRITICAL VALUE STICKER  CRITICAL VALUE:calcium 13.8  RECEIVER (on-site recipient of call):Leylani Duley G  DATE & TIME NOTIFIED: 05/19/20  MESSENGER (representative from lab):marsha  MD NOTIFIED: Merleen Nicely Desota/[name]  TIME OF NOTIFICATION:11:24  RESPONSE: MD aware

## 2020-05-19 NOTE — Patient Instructions (Signed)
Zoledronic Acid Injection (Hypercalcemia, Oncology) What is this medicine? ZOLEDRONIC ACID (ZOE le dron ik AS id) slows calcium loss from bones. It high calcium levels in the blood from some kinds of cancer. It may be used in other people at risk for bone loss. This medicine may be used for other purposes; ask your health care provider or pharmacist if you have questions. COMMON BRAND NAME(S): Zometa What should I tell my health care provider before I take this medicine? They need to know if you have any of these conditions:  cancer  dehydration  dental disease  kidney disease  liver disease  low levels of calcium in the blood  lung or breathing disease (asthma)  receiving steroids like dexamethasone or prednisone  an unusual or allergic reaction to zoledronic acid, other medicines, foods, dyes, or preservatives  pregnant or trying to get pregnant  breast-feeding How should I use this medicine? This drug is injected into a vein. It is given by a health care provider in a hospital or clinic setting. Talk to your health care provider about the use of this drug in children. Special care may be needed. Overdosage: If you think you have taken too much of this medicine contact a poison control center or emergency room at once. NOTE: This medicine is only for you. Do not share this medicine with others. What if I miss a dose? Keep appointments for follow-up doses. It is important not to miss your dose. Call your health care provider if you are unable to keep an appointment. What may interact with this medicine?  certain antibiotics given by injection  NSAIDs, medicines for pain and inflammation, like ibuprofen or naproxen  some diuretics like bumetanide, furosemide  teriparatide  thalidomide This list may not describe all possible interactions. Give your health care provider a list of all the medicines, herbs, non-prescription drugs, or dietary supplements you use. Also tell  them if you smoke, drink alcohol, or use illegal drugs. Some items may interact with your medicine. What should I watch for while using this medicine? Visit your health care provider for regular checks on your progress. It may be some time before you see the benefit from this drug. Some people who take this drug have severe bone, joint, or muscle pain. This drug may also increase your risk for jaw problems or a broken thigh bone. Tell your health care provider right away if you have severe pain in your jaw, bones, joints, or muscles. Tell you health care provider if you have any pain that does not go away or that gets worse. Tell your dentist and dental surgeon that you are taking this drug. You should not have major dental surgery while on this drug. See your dentist to have a dental exam and fix any dental problems before starting this drug. Take good care of your teeth while on this drug. Make sure you see your dentist for regular follow-up appointments. You should make sure you get enough calcium and vitamin D while you are taking this drug. Discuss the foods you eat and the vitamins you take with your health care provider. Check with your health care provider if you have severe diarrhea, nausea, and vomiting, or if you sweat a lot. The loss of too much body fluid may make it dangerous for you to take this drug. You may need blood work done while you are taking this drug. Do not become pregnant while taking this drug. Women should inform their health care provider  if they wish to become pregnant or think they might be pregnant. There is potential for serious harm to an unborn child. Talk to your health care provider for more information. What side effects may I notice from receiving this medicine? Side effects that you should report to your doctor or health care provider as soon as possible:  allergic reactions (skin rash, itching or hives; swelling of the face, lips, or tongue)  bone  pain  infection (fever, chills, cough, sore throat, pain or trouble passing urine)  jaw pain, especially after dental work  joint pain  kidney injury (trouble passing urine or change in the amount of urine)  low blood pressure (dizziness; feeling faint or lightheaded, falls; unusually weak or tired)  low calcium levels (fast heartbeat; muscle cramps or pain; pain, tingling, or numbness in the hands or feet; seizures)  low magnesium levels (fast, irregular heartbeat; muscle cramp or pain; muscle weakness; tremors; seizures)  low red blood cell counts (trouble breathing; feeling faint; lightheaded, falls; unusually weak or tired)  muscle pain  redness, blistering, peeling, or loosening of the skin, including inside the mouth  severe diarrhea  swelling of the ankles, feet, hands  trouble breathing Side effects that usually do not require medical attention (report to your doctor or health care provider if they continue or are bothersome):  anxious  constipation  coughing  depressed mood  eye irritation, itching, or pain  fever  general ill feeling or flu-like symptoms  nausea  pain, redness, or irritation at site where injected  trouble sleeping This list may not describe all possible side effects. Call your doctor for medical advice about side effects. You may report side effects to FDA at 1-800-FDA-1088. Where should I keep my medicine? This drug is given in a hospital or clinic. It will not be stored at home. NOTE: This sheet is a summary. It may not cover all possible information. If you have questions about this medicine, talk to your doctor, pharmacist, or health care provider.  2021 Elsevier/Gold Standard (2019-02-07 09:13:00) Dehydration, Adult Dehydration is condition in which there is not enough water or other fluids in the body. This happens when a person loses more fluids than he or she takes in. Important body parts cannot work right without the right  amount of fluids. Any loss of fluids from the body can cause dehydration. Dehydration can be mild, worse, or very bad. It should be treated right away to keep it from getting very bad. What are the causes? This condition may be caused by:  Conditions that cause loss of water or other fluids, such as: ? Watery poop (diarrhea). ? Vomiting. ? Sweating a lot. ? Peeing (urinating) a lot.  Not drinking enough fluids, especially when you: ? Are ill. ? Are doing things that take a lot of energy to do.  Other illnesses and conditions, such as fever or infection.  Certain medicines, such as medicines that take extra fluid out of the body (diuretics).  Lack of safe drinking water.  Not being able to get enough water and food. What increases the risk? The following factors may make you more likely to develop this condition:  Having a long-term (chronic) illness that has not been treated the right way, such as: ? Diabetes. ? Heart disease. ? Kidney disease.  Being 50 years of age or older.  Having a disability.  Living in a place that is high above the ground or sea (high in altitude). The thinner, dried air  causes more fluid loss.  Doing exercises that put stress on your body for a long time. What are the signs or symptoms? Symptoms of dehydration depend on how bad it is. Mild or worse dehydration  Thirst.  Dry lips or dry mouth.  Feeling dizzy or light-headed, especially when you stand up from sitting.  Muscle cramps.  Your body making: ? Dark pee (urine). Pee may be the color of tea. ? Less pee than normal. ? Less tears than normal.  Headache. Very bad dehydration  Changes in skin. Skin may: ? Be cold to the touch (clammy). ? Be blotchy or pale. ? Not go back to normal right after you lightly pinch it and let it go.  Little or no tears, pee, or sweat.  Changes in vital signs, such as: ? Fast breathing. ? Low blood pressure. ? Weak pulse. ? Pulse that is more  than 100 beats a minute when you are sitting still.  Other changes, such as: ? Feeling very thirsty. ? Eyes that look hollow (sunken). ? Cold hands and feet. ? Being mixed up (confused). ? Being very tired (lethargic) or having trouble waking from sleep. ? Short-term weight loss. ? Loss of consciousness. How is this treated? Treatment for this condition depends on how bad it is. Treatment should start right away. Do not wait until your condition gets very bad. Very bad dehydration is an emergency. You will need to go to a hospital.  Mild or worse dehydration can be treated at home. You may be asked to: ? Drink more fluids. ? Drink an oral rehydration solution (ORS). This drink helps get the right amounts of fluids and salts and minerals in the blood (electrolytes).  Very bad dehydration can be treated: ? With fluids through an IV tube. ? By getting normal levels of salts and minerals in your blood. This is often done by giving salts and minerals through a tube. The tube is passed through your nose and into your stomach. ? By treating the root cause. Follow these instructions at home: Oral rehydration solution If told by your doctor, drink an ORS:  Make an ORS. Use instructions on the package.  Start by drinking small amounts, about  cup (120 mL) every 5-10 minutes.  Slowly drink more until you have had the amount that your doctor said to have. Eating and drinking  Drink enough clear fluid to keep your pee pale yellow. If you were told to drink an ORS, finish the ORS first. Then, start slowly drinking other clear fluids. Drink fluids such as: ? Water. Do not drink only water. Doing that can make the salt (sodium) level in your body get too low. ? Water from ice chips you suck on. ? Fruit juice that you have added water to (diluted). ? Low-calorie sports drinks.  Eat foods that have the right amounts of salts and minerals, such  as: ? Bananas. ? Oranges. ? Potatoes. ? Tomatoes. ? Spinach.  Do not drink alcohol.  Avoid: ? Drinks that have a lot of sugar. These include:  High-calorie sports drinks.  Fruit juice that you did not add water to.  Soda.  Caffeine. ? Foods that are greasy or have a lot of fat or sugar.         General instructions  Take over-the-counter and prescription medicines only as told by your doctor.  Do not take salt tablets. Doing that can make the salt level in your body get too high.  Return to  your normal activities as told by your doctor. Ask your doctor what activities are safe for you.  Keep all follow-up visits as told by your doctor. This is important. Contact a doctor if:  You have pain in your belly (abdomen) and the pain: ? Gets worse. ? Stays in one place.  You have a rash.  You have a stiff neck.  You get angry or annoyed (irritable) more easily than normal.  You are more tired or have a harder time waking than normal.  You feel: ? Weak or dizzy. ? Very thirsty. Get help right away if you have:  Any symptoms of very bad dehydration.  Symptoms of vomiting, such as: ? You cannot eat or drink without vomiting. ? Your vomiting gets worse or does not go away. ? Your vomit has blood or green stuff in it.  Symptoms that get worse with treatment.  A fever.  A very bad headache.  Problems with peeing or pooping (having a bowel movement), such as: ? Watery poop that gets worse or does not go away. ? Blood in your poop (stool). This may cause poop to look black and tarry. ? Not peeing in 6-8 hours. ? Peeing only a small amount of very dark pee in 6-8 hours.  Trouble breathing. These symptoms may be an emergency. Do not wait to see if the symptoms will go away. Get medical help right away. Call your local emergency services (911 in the [place] Do not drive yourself to the hospital. Summary  Dehydration is a condition in which there is not enough  water or other fluids in the body. This happens when a person loses more fluids than he or she takes in.  Treatment for this condition depends on how bad it is. Treatment should be started right away. Do not wait until your condition gets very bad.  Drink enough clear fluid to keep your pee pale yellow. If you were told to drink an oral rehydration solution (ORS), finish the ORS first. Then, start slowly drinking other clear fluids.  Take over-the-counter and prescription medicines only as told by your doctor.  Get help right away if you have any symptoms of very bad dehydration. This information is not intended to replace advice given to you by your health care provider. Make sure you discuss any questions you have with your health care provider. Document Revised: 12/06/2018 Document Reviewed: 12/06/2018 Elsevier Patient Education  [place].

## 2020-05-19 NOTE — Progress Notes (Signed)
0.9% Sodium Chloride 1040mL to run at 55mL/hr for 2 hours - VO [name] for hypercalcemia.  Hardie Pulley, PharmD, BCPS, BCOP

## 2020-05-19 NOTE — Progress Notes (Signed)
Severe hypercalcemia: We will initiate treatment with Zometa.  She will receive 1 dose today. She is set up for scans later today and we will assess to see if she has bone metastases that are progressing.

## 2020-05-19 NOTE — Progress Notes (Signed)
CRITICAL VALUE ALERT  Critical Value:  Calcium 13.2  Date & Time Notied: 05/19/20 at 7  Provider Notified: [name], MD  Orders Received/Actions taken: Per MD pt needing labs re drawn to assess accuracy.  Orders placed.

## 2020-05-20 NOTE — Progress Notes (Addendum)
  Radiation Oncology         [phone]) [phone] ________________________________  Name: Heather [name] MRN: [id number]  Date: 02/07/2020  DOB: Oct 02, 1945  End of Treatment Note  Diagnosis:   75 yo woman with stage IV breast cancer with right femur metastases     Indication for treatment:  Palliation       Radiation treatment dates:   9/20-10/1/21  Site/dose:    The two metastatic lesions in the right femur were treated to 30 Gy in 10 fractions.  Beams/energy:    1. The right femur fields were treated with anterior and posterior 6X fields and custom MLCs  Narrative: The patient tolerated radiation treatment relatively well.     Plan: The patient has completed radiation treatment. The patient will return to radiation oncology clinic for routine followup in one month. I advised her to call or return sooner if she has any questions or concerns related to her recovery or treatment. ________________________________  Sheral Apley. [name], M.D.

## 2020-05-20 NOTE — Progress Notes (Signed)
[place] [facility] and [facility] Counseling Note   Patient and counselor restarted sessions after winter break. Patient was in pain during session and spent the first part of session describing her previous day in the cancer center, which caused her distress. Counselor and patient also discussed her thoughts about potentially ending active tx and focusing on pain management. Pt's family encourages her to "keep fighting" and so she keeps trying different tx, despite her pain. Pt also described her appreciation for her husband and their improving relationship, as well as her debate on returning to physical therapy. Pt was oriented times three during session. Her mood was tired, sad, and a little worn-down with a congruent affect. No signs of SI/HI/NSSI were present in session. With varying treatments losing effectiveness, the pt is coming closer to facing her own mortality. Her family values her being here, so she continues for them, but she is beginning to value quality of life over quantity. Next session will assess where patient is at, medically, and discuss making decisions and reducing anxiety.   Gaylyn Rong Counseling Intern

## 2020-05-20 NOTE — Progress Notes (Signed)
  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  ExacTrac registration was performed for each couch angle.  The 100% isodose line was prescribed.  6 MV X-rays were delivered in the flattening filter free beam mode.  Narrative: The patient tolerated radiation treatment relatively well.     Plan: The patient has completed radiation treatment. The patient will return to radiation oncology clinic for routine followup in one month. I advised her to call or return sooner if she has any questions or concerns related to her recovery or treatment. ________________________________  Sheral Apley. [name], M.D.

## 2020-05-20 NOTE — Progress Notes (Signed)
Patient Care Team: [name], MD as PCP - General (Family Medicine)  DIAGNOSIS:    ICD-10-CM   1. Primary cancer of upper outer quadrant of right female breast ([place])  C50.411   2. Brain metastasis (HCC)  C79.31 ONCBCN PHYSICIAN COMMUNICATION 1    PHYSICIAN COMMUNICATION ORDER    lidocaine-prilocaine (EMLA) cream  3. Spinal cord compression due to malignant neoplasm metastatic to spine (HCC)  G95.29 ONCBCN PHYSICIAN COMMUNICATION 1   C79.51 PHYSICIAN COMMUNICATION ORDER    lidocaine-prilocaine (EMLA) cream  4. Carcinoma of right breast, stage 4 (HCC)  C50.911 ONCBCN PHYSICIAN COMMUNICATION 1    PHYSICIAN COMMUNICATION ORDER    lidocaine-prilocaine (EMLA) cream    SUMMARY OF ONCOLOGIC HISTORY: Oncology History  Primary cancer of upper outer quadrant of right female breast ([place])  03/08/2012 Initial Diagnosis   Right breast invasive ductal carcinoma ER positive PR negative HER-2 positive Ki-67 44%; another breast mass biopsied in the anterior part of the breast which was also positive for malignancy that was HER-2 negative   03/14/2012 Cancer Staging   Staging form: Breast, AJCC 7th Edition - Pathologic: Stage IV (M1) - Signed by [name], NP on 06/22/2018   05/10/2012 Surgery   Right mastectomy and axillary lymph node dissection: Multifocal disease 5 cm, 1.7 cm, 1.6 cm, ER positive PR negative HER-2 positive Ki-67 44%, 3/7 lymph nodes positive   06/14/2012 - 06/12/2013 Chemotherapy   Adjuvant chemotherapy with [place] 6 followed by Herceptin maintenance   10/25/2012 - 12/11/2012 Radiation Therapy   Adjuvant radiation therapy   02/07/2013 -  Anti-estrogen oral therapy   Letrozole 2.5 mg daily   02/14/2015 Imaging   MRI spine: Large destructive T5 lesion with severe pathologic compression fracture and extensive epidural tumor, severe spinal stenosis with moderate cord compression, tumor involvement of T4   03/18/2015 PET scan   Residual enhancing soft tissue adjacent to  the spinal cord. No evidence of metastatic disease. Nonspecific uptake the left nipple   03/27/2015 - 03/30/2015 Hospital Admission   T4-6 decompression for spinal cord compression (lower extremity paralysis)   04/10/2015 -  Chemotherapy   Palliative treatment with Herceptin every 3 weeks with letrozole 2.5 mg daily   04/13/2015 - 05/19/2015 Radiation Therapy   Palliative radiation treatment to the spine   09/01/2015 Imaging   CT chest abdomen pelvis: Pathologic fracture with posterior fusion at T5 no other evidence of metastatic disease in the chest abdomen pelvis   12/23/2015 Imaging   CT CAP: new nonspecific 0.6 cm lymph node was stated mediastinum needs follow-up CT, innumerable tiny groundglass pulmonary nodules throughout both lungs unchanged, patchy consolidation from radiation, T5 fracture, no mets   04/15/2016 PET scan   Rt para-spinal mass mildy hypermetabolic SUV 5.9, lytic cortical lesion 4.8 cm lesion left prox femur suv 8.8 favor osseus met disease   04/28/2016 - 05/12/2016 Radiation Therapy   Palliative XRT Left femur   05/24/2016 Procedure   Soft tissue mass biopsy back: Metastatic breast cancer ER 80%, PR 0%, Ki-67 50%, HER-2 positive ratio 2.25   06/09/2016 - 03/29/2017 Anti-estrogen oral therapy   Faslodex with Herceptin and Perjeta every 4 weeks   06/13/2016 - 06/15/2016 Hospital Admission   uncomplicated revision of previous thoracic instrumentation.   10/10/2016 PET scan   Interval development of new hypermetabolic 2.5 x 1.1 cm lesion posterior right eighth rib consistent with metastatic disease, stable right paraspinal lesion at T8, clustered soft tissue nodules in the posterior midline back near cervicothoracic junction  smaller than before    11/03/2016 - 11/07/2016 Radiation Therapy   Palliative radiation to right eighth rib   11/04/2016 Imaging   Solitary contrast-enhancing lesion in the right cerebellum 6 x 2 mm concerning for metastatic lesion   11/24/2016 - 11/24/2016  Radiation Therapy   SRS to the brain lesion   04/18/2017 PET scan   New ill-defined rounded airspace opacity in posterior right lower lobe. Differential diagnosis includes malignancy and infectious/inflammatory process.Increased hypermetabolic lytic lesion in T8 vertebral body, consistent with bone metastasis.  Increased hypermetabolic posterior upper chest wall subcutaneous nodules, highly suspicious metastatic disease. Further improvement in right posterior eighth rib metastasis.   05/04/2017 - 07/27/2017 Chemotherapy   Kadcyla every 3 weeks palliative chemotherapy stopped for progression   07/26/2017 Imaging   Interval increase in size of the multiple enhancing masses posteriorly overlying the spine, interval development of left axillary and mediastinal adenopathy, increase in the size of lytic lesions in T8-T9 and left ninth rib, increased groundglass opacities right lower lobe lung   07/27/2017 - 05/11/2018 Chemotherapy   Palbociclib + Faslodex + trastuzumab + Pertuzumab     04/03/2018 PET scan   PET CT scan showed progression of disease not only in the bones but also in the subcutaneous nodules.   05/15/2018 - 05/25/2018 Radiation Therapy   Palliative radiation to the back   06/01/2018 -  Chemotherapy   Enhertu (Transtuzumab Deruxtecan)    08/07/2018 - 08/07/2018 Radiation Therapy   T12 vertebral body SRS   Carcinoma of right breast, stage 4 (HCC)  02/14/2015 Initial Diagnosis   Breast cancer metastasized to bone, right ([place])   06/01/2018 - 02/07/2020 Chemotherapy   The patient had palonosetron (ALOXI) injection 0.25 mg, 0.25 mg, Intravenous,  Once, 24 of 30 cycles Administration: 0.25 mg (06/01/2018), 0.25 mg (06/22/2018), 0.25 mg (08/24/2018), 0.25 mg (07/13/2018), 0.25 mg (08/03/2018), 0.25 mg (09/14/2018), 0.25 mg (10/05/2018), 0.25 mg (11/16/2018), 0.25 mg (10/26/2018), 0.25 mg (12/07/2018), 0.25 mg (12/28/2018), 0.25 mg (01/18/2019), 0.25 mg (03/15/2019), 0.25 mg (02/15/2019), 0.25 mg  (04/12/2019), 0.25 mg (05/09/2019), 0.25 mg (06/07/2019), 0.25 mg (07/05/2019), 0.25 mg (08/09/2019), 0.25 mg (09/13/2019), 0.25 mg (10/18/2019), 0.25 mg (11/22/2019), 0.25 mg (12/27/2019) fam-trastuzumab deruxtecan-nxki (ENHERTU) 332 mg in dextrose 5 % 100 mL chemo infusion, 5.4 mg/kg = 332 mg, Intravenous,  Once, 24 of 30 cycles Dose modification: 4.4 mg/kg (original dose 5.4 mg/kg, Cycle 7, Reason: Provider Judgment), 3.2 mg/kg (original dose 5.4 mg/kg, Cycle 9, Reason: Dose not tolerated), 150 mg (original dose 5.4 mg/kg, Cycle 18, Reason: Dose not tolerated) Administration: 332 mg (06/01/2018), 332 mg (06/22/2018), 332 mg (07/13/2018), 332 mg (08/24/2018), 332 mg (08/03/2018), 332 mg (09/14/2018), 272 mg (10/05/2018), 200 mg (11/16/2018), 272 mg (10/26/2018), 200 mg (12/07/2018), 200 mg (12/28/2018), 200 mg (01/18/2019), 200 mg (03/15/2019), 200 mg (02/15/2019), 200 mg (04/12/2019), 200 mg (05/09/2019), 200 mg (06/07/2019), 150 mg (07/05/2019), 150 mg (08/09/2019), 150 mg (09/13/2019), 150 mg (10/18/2019), 150 mg (11/22/2019), 150 mg (12/27/2019)  for chemotherapy treatment.    05/29/2020 -  Chemotherapy      Patient is on Antibody Plan: BREAST TRASTUZUMAB Q21D    Brain metastasis ([place])  11/18/2016 Initial Diagnosis   Brain metastasis ([place])   05/29/2020 -  Chemotherapy      Patient is on Antibody Plan: BREAST TRASTUZUMAB Q21D      CHIEF COMPLIANT: Follow-up of metastatic breast cancer  INTERVAL HISTORY: Heather [name] is a 75 y.o. with above-mentioned history of metastatic breast cancercurrently onchemotherapy with trastuzumab-Deruxtecan (Enhertu)every6weeks.Brain MRI on 05/13/20  showed progression of osseous metastatic disease in the calvarium, skull base, and upper cervical spine. CT CAP on 05/19/20 showed multiple new bone lesions in the axial and proximal appendicular skeleton and mild increase in the size of lymph nodes in the anterior mediastinum. Bone scan on 05/19/20 showed progression of osseous metastatic disease.  She presentsto the clinic todayfor treatment.  She complains of diffuse pain throughout the body especially worse in the right rib cage.  ALLERGIES:  is allergic to other, acyclovir and related, chlorhexidine, monascus purpureus went yeast, pravastatin sodium, zanaflex [tizanidine hcl], codeine, keflex [cephalexin], naprosyn [naproxen], oxycodone, tramadol, and tussin [guaifenesin].  MEDICATIONS:  Current Outpatient Medications  Medication Sig Dispense Refill  . capecitabine (XELODA) 500 MG tablet Take 2 tablets (1,000 mg total) by mouth 2 (two) times daily after a meal. 56 tablet 6  . fentaNYL (DURAGESIC) 25 MCG/HR Place 1 patch onto the skin every 3 (three) days. 10 patch 0  . tucatinib (TUKYSA) 150 MG tablet Take 2 tablets (300 mg total) by mouth in the morning and at bedtime. Take every 12 hrs at the same time each day with or without a meal. 120 tablet 6  . acetaminophen (TYLENOL) 500 MG tablet Take 500 mg by mouth every 6 (six) hours as needed.    . ALPRAZolam (XANAX) 0.25 MG tablet Take 1 tablet by mouth as needed.    Marland Kitchen anastrozole (ARIMIDEX) 1 MG tablet Take 1 tablet (1 mg total) by mouth daily. 90 tablet 3  . BOOSTRIX 5-2.5-18.5 LF-MCG/0.5 injection     . Cholecalciferol (VITAMIN D) 2000 UNITS tablet Take 2,000 Units by mouth daily.    . COLLAGEN PO Take by mouth.    . Cranberry 250 MG CAPS Take by mouth.    . cycloSPORINE (RESTASIS) 0.05 % ophthalmic emulsion 1 drop 2 (two) times daily. (Patient not taking: Reported on 01/07/2020)    . fluticasone (FLONASE) 50 MCG/ACT nasal spray  (Patient not taking: Reported on 04/15/2020)    . gabapentin (NEURONTIN) 600 MG tablet Take 1 tablet (600 mg total) by mouth as directed. Take 1 tablet in the AM and afternoon and 1 1/2 tabs at bedtime 315 tablet 2  . HYDROcodone-acetaminophen (NORCO/VICODIN) 5-325 MG tablet Take 1 tablet by mouth every 6 (six) hours as needed for moderate pain. (Patient not taking: Reported on 01/07/2020) 30 tablet 0  .  lapatinib (TYKERB) 250 MG tablet Take 4 tablets (1,000 mg total) by mouth daily. Take on an empty stomach, at least 1 hour before or 1 hour after meals. 120 tablet 3  . lidocaine-prilocaine (EMLA) cream Apply to affected area once 30 g 3  . lidocaine-prilocaine (EMLA) cream Apply to affected area once 30 g 3  . omeprazole (PRILOSEC) 40 MG capsule TAKE 1 CAPSULE BY MOUTH ONCE DAILY FOR 30 DAYS  5  . ondansetron (ZOFRAN) 8 MG tablet Take 1 tablet (8 mg total) by mouth every 8 (eight) hours as needed for nausea or vomiting. 20 tablet 0  . OVER THE COUNTER MEDICATION Place 1 drop into both eyes 2 (two) times daily as needed (dry eyes). Over the counter lubricating eye drops    . polyethylene glycol (MIRALAX / GLYCOLAX) packet Take 17 g by mouth daily as needed.    . polyethylene glycol powder (GLYCOLAX/MIRALAX) 17 GM/SCOOP powder Take by mouth.    . Probiotic Product (PROBIOTIC DAILY PO) Take 1 capsule by mouth daily.  (Patient not taking: Reported on 01/07/2020)    . prochlorperazine (COMPAZINE) 10 MG  tablet Take 1 tablet (10 mg total) by mouth every 6 (six) hours as needed for nausea or vomiting. 60 tablet 3  . Prochlorperazine Maleate (COMPAZINE PO)     . spironolactone (ALDACTONE) 25 MG tablet Take 25 mg by mouth daily.     No current facility-administered medications for this visit.   Facility-Administered Medications Ordered in Other Visits  Medication Dose Route Frequency Provider Last Rate Last Admin  . cyanocobalamin ((VITAMIN B-12)) injection 1,000 mcg  1,000 mcg Intramuscular Q6 weeks [name], MD   1,000 mcg at 03/19/20 1110  . heparin lock flush 100 unit/mL  500 Units Intracatheter Once [name], MD      . sodium chloride flush (NS) 0.9 % injection 10 mL  10 mL Intracatheter Once [name], MD        PHYSICAL EXAMINATION: ECOG PERFORMANCE STATUS: 1 - Symptomatic but completely ambulatory  Vitals:   05/21/20 1016  BP: (!) 141/66  Pulse: 93  Resp: 16  Temp: 97.7 F  (36.5 C)  SpO2: 98%   Filed Weights   05/21/20 1016  Weight: 125 lb 12.8 oz (57.1 kg)    LABORATORY DATA:  I have reviewed the data as listed CMP Latest Ref Rng & Units 05/19/2020 03/19/2020 02/07/2020  Glucose 70 - 99 mg/dL 101(H) 75 96  BUN 8 - 23 mg/dL _0 Creatinine 0.44 - 1.00 mg/dL 1.16(H) 0.93 0.84  Sodium 135 - 145 mmol/L 136 139 139  Potassium 3.5 - 5.1 mmol/L 3.5 4.0 3.9  Chloride 98 - 111 mmol/L 98 99 104  CO2 22 - 32 mmol/L _1 Calcium 8.9 - 10.3 mg/dL 13.8(HH) 10.7(H) 10.4(H)  Total Protein 6.5 - 8.1 g/dL 8.1 8.2(H) 7.4  Total Bilirubin 0.3 - 1.2 mg/dL 1.4(H) 1.5(H) 1.0  Alkaline Phos 38 - 126 U/L 171(H) 168(H) 125  AST 15 - 41 U/L 43(H) 35 35  ALT 0 - 44 U/L _2 Lab Results  Component Value Date   WBC 4.3 05/19/2020   HGB 13.4 05/19/2020   HCT 40.2 05/19/2020   MCV 86.6 05/19/2020   PLT 109 (L) 05/19/2020   NEUTROABS 3.4 05/19/2020    ASSESSMENT & PLAN:  Primary cancer of upper outer quadrant of right female breast ([place]) Right breast invasive ductal carcinoma ER positive PR negative HER-2 positive Ki-67 44% multifocal disease 3/7 lymph nodes positive T2 N1 M0 stage IIB status post adjuvant chemotherapy with TCH followed by Herceptin maintenance, adjuvant radiation therapy and Letrozole. MRI 02/14/2015: T5 large destructive lesion with pathologic compression fracture and extensive epidural tumor, involvement of T4, excision metastatic carcinoma ER 60%, PR 0%, HER-2 positive ratio 2.71 average copy #7.45 03/27/2015: T4-6 decompression surgery for spinal cord compression PET-CT 12/10/17Rt para-spinal mass mildy hypermetabolic SUV 5.9, lytic cortical lesion 4.8 cm lesion left prox femur suv 8.8 favor osseus met disease Echo 04/12/2017 shows well preserved ejection fraction of 55-60%  Treatment summary: 1. Resumed anastrozole 05/21/2015, but it has caused significant hair loss so we switched her to exemestane 25 mg once daily 07/23/2015 2.  Herceptin every 3 week started 04/10/2015 3. Bone metastases: Zometa every 3 months 4. Radiation therapy to the spine (Dr. [name]) 04/13/2015 to 05/19/2015 5. Radiation therapy to left femur 04/28/2016 to 05/12/2016  6. SRS to the brain lesion 11/24/2016 7.Kadcyla started 05/05/2017 discontinued 07/27/2017 8.Palbociclib +Faslodex +trastuzumab+ Pertuzumabstarted 08/10/2017, stopped 04/04/2018 9. XRT to T123/04/2019 10. Enhertu started 06/01/18-12/27/2019 stopped for progression 11SRS to brain January  2021 12.  Lapatinib with anastrozole -------------------------------------------------------------------------------------------------------------------------- 05/19/2020: Bone scan: Progressive bone metastatic disease, bilateral ribs, thoracic and lumbar spine, pelvis, sternum, right humeral diaphysis, bilateral femoral, new lesions are seen calvaria, ribs, right humerus, L3 vertebral body and distal left femur  05/19/2020: CT CAP: New lytic bone lesion, similar lesions T12 and L5, mild increase in the prevascular lymph nodes  Treatment plan: Xeloda with Tucatinib Xeloda counseling: Discussed risks and benefits of Xeloda including the risk of diarrhea, hand-foot syndrome, cytopenias and liver function changes. We also discussed Tucatinib related side effects.  We discussed the goals of care being palliation and prolongation of life.  We hope that her pain symptoms would ease by taking this treatment.  Hypercalcemia: Patient receives Zometa and IV fluids yesterday. Pain issues: I sent a prescription for fentanyl patch 25 mcg every 72 hours.  Return to clinic in 2 weeks to assess tolerability to Xeloda and Tucatinib.    Orders Placed This Encounter  Procedures  . ONCBCN PHYSICIAN COMMUNICATION 1    Trastuzumab SQ options for each treatment day available via "Add Orders".  . PHYSICIAN COMMUNICATION ORDER    A baseline Echo/Muga should be obtained prior to initiation of Trastuzumab, at 3,  6, 9 months during Trastuzumab treatment.   The patient has a good understanding of the overall plan. she agrees with it. she will call with any problems that may develop before the next visit here.  Total time spent: 45 mins including face to face time and time spent for planning, charting and coordination of care  [name], MD 05/21/2020  I, Cloyde Reams Dorshimer, am acting as scribe for Dr. [name].  I have reviewed the above documentation for accuracy and completeness, and I agree with the above.

## 2020-05-21 ENCOUNTER — Other Ambulatory Visit: Payer: Self-pay

## 2020-05-21 ENCOUNTER — Other Ambulatory Visit: Payer: Self-pay | Admitting: Hematology and Oncology

## 2020-05-21 ENCOUNTER — Telehealth: Payer: Self-pay

## 2020-05-21 ENCOUNTER — Inpatient Hospital Stay (HOSPITAL_BASED_OUTPATIENT_CLINIC_OR_DEPARTMENT_OTHER): Payer: Medicare Other | Admitting: Hematology and Oncology

## 2020-05-21 ENCOUNTER — Ambulatory Visit
Admission: RE | Admit: 2020-05-21 | Discharge: 2020-05-21 | Disposition: A | Discharge status: Home / Self Care | Payer: Medicare Other | Source: Ambulatory Visit | Attending: Urology | Admitting: Urology

## 2020-05-21 ENCOUNTER — Telehealth: Payer: Self-pay | Admitting: Pharmacist

## 2020-05-21 VITALS — BP 141/66 | HR 93 | Temp 97.7°F | Resp 16 | Ht 63.0 in | Wt 125.8 lb

## 2020-05-21 DIAGNOSIS — C7951 Secondary malignant neoplasm of bone: Secondary | ICD-10-CM | POA: Diagnosis not present

## 2020-05-21 DIAGNOSIS — Z5112 Encounter for antineoplastic immunotherapy: Secondary | ICD-10-CM | POA: Diagnosis not present

## 2020-05-21 DIAGNOSIS — Z170 Estrogen receptor positive status [ER+]: Secondary | ICD-10-CM | POA: Diagnosis not present

## 2020-05-21 DIAGNOSIS — C50911 Malignant neoplasm of unspecified site of right female breast: Secondary | ICD-10-CM

## 2020-05-21 DIAGNOSIS — C50411 Malignant neoplasm of upper-outer quadrant of right female breast: Secondary | ICD-10-CM | POA: Diagnosis not present

## 2020-05-21 DIAGNOSIS — Z923 Personal history of irradiation: Secondary | ICD-10-CM | POA: Diagnosis not present

## 2020-05-21 DIAGNOSIS — G9529 Other cord compression: Secondary | ICD-10-CM

## 2020-05-21 DIAGNOSIS — C7931 Secondary malignant neoplasm of brain: Secondary | ICD-10-CM

## 2020-05-21 MED ORDER — CAPECITABINE 500 MG PO TABS: 1000.0000 mg | ORAL_TABLET | Freq: Two times a day (BID) | ORAL | 6 refills | Status: DC

## 2020-05-21 MED ORDER — FENTANYL 25 MCG/HR TD PT72: 1.0000 | MEDICATED_PATCH | TRANSDERMAL | 0 refills | Status: DC

## 2020-05-21 MED ORDER — LIDOCAINE-PRILOCAINE 2.5-2.5 % EX CREA: TOPICAL_CREAM | CUTANEOUS | 3 refills | Status: DC

## 2020-05-21 MED ORDER — TUCATINIB 150 MG PO TABS: 300.0000 mg | ORAL_TABLET | Freq: Two times a day (BID) | ORAL | 6 refills | Status: DC

## 2020-05-21 NOTE — Telephone Encounter (Signed)
Oral Oncology Patient Advocate Encounter  Prior Authorization for Heather [name] has been approved.    PA# [id number] Effective dates: 05/21/20 through 05/08/21  Patients co-pay is $3522  Oral Oncology Clinic will continue to follow.   [facility] [place] [facility] Phone [phone] Fax [phone] 05/21/2020 11:43 AM

## 2020-05-21 NOTE — Assessment & Plan Note (Signed)
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 MRI 02/14/2015: T5 large destructive lesion with pathologic compression fracture and extensive epidural tumor, involvement of T4, excision metastatic carcinoma ER 60%, PR 0%, HER-2 positive ratio 2.71 average copy #7.45 03/27/2015: T4-6 decompression surgery for spinal cord compression PET-CT 12/10/17Rt para-spinal mass mildy hypermetabolic SUV 5.9, lytic cortical lesion 4.8 cm lesion left prox femur suv 8.8 favor osseus met disease Echo 04/12/2017 shows well preserved ejection fraction of 55-60%  Treatment summary: 1. Resumed anastrozole 05/21/2015, but it has caused significant hair loss so we switched her to exemestane 25 mg once daily 07/23/2015 2. Herceptin every 3 week started 04/10/2015 3. Bone metastases: Zometa every 3 months 4. Radiation therapy to the spine (Dr. [name]) 04/13/2015 to 05/19/2015 5. Radiation therapy to left femur 04/28/2016 to 05/12/2016  6. SRS to the brain lesion 11/24/2016 7.Kadcyla started 05/05/2017 discontinued 07/27/2017 8.Palbociclib +Faslodex +trastuzumab+ Pertuzumabstarted 08/10/2017, stopped 04/04/2018 9. XRT to T123/04/2019 10. Enhertu started 06/01/18-12/27/2019 stopped for progression 11SRS to brain January 2021 12.  Lapatinib with anastrozole -------------------------------------------------------------------------------------------------------------------------- 05/19/2020: Bone scan: Progressive bone metastatic disease, bilateral ribs, thoracic and lumbar spine, pelvis, sternum, right humeral diaphysis, bilateral femoral, new lesions are seen calvaria, ribs, right humerus, L3 vertebral body and distal left femur  05/19/2020: CT CAP: New lytic bone lesion, similar lesions T12 and  L5, mild increase in the prevascular lymph nodes  Treatment plan: Xeloda with Tucatinib Xeloda counseling: Discussed risks and benefits of Xeloda including the risk of diarrhea, hand-foot syndrome, cytopenias and liver function changes. We also discussed Tucatinib related side effects.  Return to clinic in 2 weeks to assess tolerability to Xeloda and Tucatinib.

## 2020-05-21 NOTE — Progress Notes (Signed)
Radiation Oncology         [phone] ________________________________  Name: Heather Heather [name] MRN: [id number]  Date: 05/21/2020  DOB: [birth year]  Follow up Visit Note- Conducted via telephone due to current COVID-19 concerns for limiting Heather [name] exposure  CC: Heather Stains, MD  Heather Lose, MD  Diagnosis:   75 y.o. woman with ER positive, PR negative, HER-2 positive metastatic breast cancer with multifocal metastases involving Heather spine, bony skeleton, lung, and brain..   Interval Since Last Radiation: 2 months 11/1-11/11:   SRS// Heather right temporal target was treated to 25 Gy in 5 fractions of SRS  01/27/20 - 02/07/20: Heather two metastatic lesions in Heather right femur were treated to 30 Gy in 10 fractions.  05/31/19: SRS brain/ Heather 74m central cerebellar target was treated to a prescription dose of 20 Gy in a single fraction.  08/07/18:  SRS Spine// Heather targeted metastases in Heather T12 and L2 vertebral bodies were treated to a prescription dose of 18 Gy in 1 fraction.   05/14/2018 - 05/25/2018:  Chest Wall, Right Upper Posterior subcutaneous lesions / treated to 30 Gy delivered in 10 fractions of 3 Gy  05/18/2017 - 05/31/2017:  Heather thoracic spine (T7-T9) was treated to 30 Gy in 10 fractions of 3 Gy.  6/27-7/18/18 SRS and palliative treatment: 1.  Heather rib 8th was treated to 30 Gy in 10 fractions 2.  Right cerebellar 6 mm target was treated using 3 Dynamic Conformal Arcs to a prescription dose of 20 Gy in one fraction  04/27/16-05/12/16:  Left femur/ 30 Gy in 10 fractions   04/13/2015-05/19/2015 Postop:  55 Gy in 25 fractions of 2.2 Gy to Heather thoracic spine   10/25/12-12/11/12: 50.4 Gy to Heather right chest wall and regional lymph nodes in 28 fractions with a 10 Gy boost to Heather right chest wall in 1 session.  Narrative:  In summary, Heather Heather [name] a well known Heather [name] to our service with a history of metastatic breast cancer originally treated in 2014 to Heather chest wall and regional lymph nodes. She  developed recurrent disease within Heather thoracic spine and subsequently has undergone 3 surgeries, in addition to radiotherapy, with her most recent surgery being on 06/13/2016 where she had removal of thoracic hardware and replacement of pedicle screws with stabilization.  Of note previously placed screws between T3, T4 T6 and T7 were identified and dissection was carried out inferiorly to T8, T9-T10 and T11, T8 through T11 pedicle screw trajectories were placed bilaterally. She recovered well since treatment to her spine as she had been paralyzed in 2016, and with rigorous therapy has been able to make an impressive recovery. She continues on systemic therapy under Heather care of Dr. GLindi Adieand is also followed along in Heather brain and spine oncology conference. PET scan on 10/10/16 revealed a new hypermetabolic 2.5 x 1.1 cm lesion in Heather posterior right 8th rib. She also mentioned having new onset headaches, dizziness, and an episode of visual change,so an MRI brain was performed 11/04/16 demonstrating a solitary contrast-enhancing lesion within Heather right cerebellum, measuring 6 x 2 mm and lying just beneath Heather right tentorial leaflet and concerning for a metastatic lesion.  This solitary lesion was confirmed with SRS protocol MRI brain on 11/16/16 with no new lesions noted. This lesion was treated with SRS on 11/23/16 which she tolerated very well. She also completed palliative radiotherapy to Heather right eighth rib from 11/02/16 - 11/23/16 with significant improvement in her pain. She remained on  Herceptin, Perjeta, Faslodex and Zometa for systemic disease under Heather care and direction of Dr. Lindi [name].  She developed increased pain in Heather T8 region and also had PET positive findings in this region, as well as hypermetabolic right lower lobe mass concerning for progressive metastatic disease on follow up imaging 04/18/2017, despite systemic treatment. Her systemic therapy was switched to Kadcyla on 05/05/17 with  discontinuation of Faslodex, Herceptin, and Perjeta.  She proceeded with palliative radiotherapy to 30 Gy in 10 fractions to Heather thoracic spine (T7-T9) from 05/18/2017 - 05/31/2017.  This treatment was tolerated very well.  A repeat brain MRI performed 06/15/17 showed no new or progressive contrast enhancing lesions and no residual abnormality at Heather site of Heather previous treated right cerebellar lesion but Chest CT on 07/26/17 showed clear disease progression with interval increase in size of Heather multiple enhancing masses posteriorly overlying Heather spine, interval development of left axillary and mediastinal adenopathy, increase in Heather size of lytic lesions in T8-T9 and left ninth rib, as well as increased groundglass opacities right lower lobe lung. Therefore, she was switched to Palbociclib + Faslodex + trastuzumab + Pertuzumab, started 08/10/17.   MRI T-spine on 09/21/17 showed Heather known T12 lesion had slightly progressed by a few millimeters from 03/15/17 and there was a new lesion at L2 which was not present on lumbar MRI from 07/13/16. Follow up systemic imaging on 12/13/17, showed a good response to treatment with improved LAN and decreased subcutaneous nodules in Heather upper thoracic region.  Follow up brain MRI imaging has continued to show interval increase in size Heather right parietal calvarium metastasis with stable appearance in size of a right frontal calvarial metastasis and no parenchymal or dural metastases. PET scan from 04/03/2018 unfortunately showed significant disease progression despite systemic therapy.  Her systemic chemotherapy was discontinued on 04/04/2018 due to disease progression and she was switched to [facility] (Fam-trastuzumab-Durextecan) q 3 weeks beginning 06/01/18.  Repeat MRI imaging of Heather brain and thoracic spine was performed on 07/12/2018.  Brain imaging demonstrated 3 skull metastases ranging from 5 to 14 mm in diameter which appeared stable to slightly increased since December 2019 but no  new metastases or brain parenchymal or dural metastases identified.  On thoracic spine imaging there was enlargement of Heather left paracentral T12 vertebral body metastasis increased from 14 mm in May 2019 to 20 mm currently without associated pathologic fracture and otherwise stable appearance of previously treated lesions in Heather thoracic spine with no new disease noted.  Heather posterior subcutaneous nodular metastases appear regressed.  Her imaging studies were reviewed and discussed at Heather multidisciplinary brain and spine conference on 07/16/2018 and Heather consensus recommendation was to proceed with stereotactic radiosurgery [facility]) to Heather progressive disease at T12 which was completed on 08/07/18.  At Heather time of her CT [facility] for treatment planning, it was also noted that she had an enlarging lesion in Heather vertebral body of L2 as compared to her PET scan from 03/28/2018 so this lesion was included in Heather planning and was treated with a single fraction SRS at Heather time of T12 treatment as well. She tolerated Heather treatment well and did not experience any ill side effects.  Follow up systemic imaging with PET 10/03/18 showed a good response to treatment with less activity in Heather recently treated lesions at L2 and T12, no new bony lesions, as well as resolution of subcutaneous nodules in Heather upper back and pubic area and resolution of right adrenal hypermetabolic mass.  Her  brain MRI from 10/13/18 was also stable with no visible intraparenchymal metastases to Heather brain and an unchanged appearance of 3 calvarial metastases.  She has longstanding weakness in bilateral LEs, unchanged recently and she associates this with her inactivity due to pronounced fatigue following systemic treatments with ENHERTU.  She reports that it begins to improve approximately 2 weeks after her treatments when she is able to become more mobile but resumes following each treatment every 3 weeks.  She denies any new or increased paraesthesias in Heather  lower extremities but reports neuropathy in bilateral UEs as well as "nerve pain" in Heather right axilla that travels down her right side/torso.  She also reports chronic right>left jaw pain and reports that her dentist has told her previously that she had some loss of bone in Heather right jaw based on xray evaluation.  She is taking Zometa q 3 months and will discuss this with Dr. Lindi [name].   A follow up MRI brain scan from 05/17/2019 showed a slowly enlarging metastasis at Heather superior cerebellar vermis measuring 4 mm but without mass-effect or edema.  3 additional known calvarial metastases did not show any growth or progression.  After discussion of these findings, Heather Heather [name] elected to proceed with Merlin treatment to Heather enlarging lesion and this was completed in a single fraction on 05/31/2019. She tolerated Heather treatment very well, without any ill side effects. Follow up MRI brain scan performed 09/01/19 showed stability of Heather previously treated lesions and calvarial lesions in Heather right frontal and parietal regions. There was a slight increase in size (from 1.2 cm to 1.6 cm) of a right temporal calvarial metastasis but no new metastases identified and no parenchymal disease.  She continued ENHERTU systemic therapy every 5 weeks under Heather care of Dr. Lindi [name] but this was discontinued in 12/2019 due to disease progression.  She was switched to Lapatinib and anastrazole systemic therapy. When we met for follow up in 01/2020, she mentioned that she had been having some progressive bilateral thigh pain, L>R. Heather pain would occasionally wake her from sleep at night and was worse with long periods of standing or weightbearing activities. She denied any recent trauma or injury and reported that she has had similar pain in Heather past and was found to have bony metastasis in Heather mid left femur at that time, and subsequently treated with palliative radiation in 2018.  Heather follow up MRI brain scan from 01/02/20 showed 2 new enhancing  skull metastases, measuring 9 mm at Heather superolateral aspect of Heather left orbit and 5 mm in Heather left temporal bone but no evidence of residual parenchymal disease and other skull lesions appeared relatively stable. This scan was reviewed at brain and spine tumor board and consensus recommendation was to continue to monitor Heather skull lesions on routine follow up scans.   Follow up systemic imaging with CT A/P and Bone scan on 01/01/20 showed stable osseous metastases and small prevascular nodes measuring up to 0.8 cm, suspicious for early nodal recurrence. She met with Dr. Lindi [name] on 01/02/20 and was switched to Lapatinib and anastrazole systemic therapy.  Xrays of Heather right and left femurs were performed on 01/15/20 for further evaluation of Heather progressive pain she was having and did show metastatic lesions in both Heather right and left femoral shaft, potentially at risk for fracture if left untreated.  Dr. [name] fused her most recent imaging with our prior treatment planning images and did confirm overlap/retreatment of Heather left-sided lesion in previous  treatment fields. Therefore, Heather Heather [name] proceeded with a 2 week course of palliative radiation to Heather two metastatic lesions in Heather right femur, completed 02/07/20 and tolerated well.  She was seen with ENT at [facility] in 02/2020 for evaluation of right sided jaw pain and tenderness with Heather bone under her right ear and across her right cheek. She said that Heather pain was intermittent, but she woke up with swelling and more pain than usual which prompted Heather evaluation with ENT. She was not able to get in with an ENT here, so she got an appointment with someone at [facility] on 02/14/20 Guadalupe Maple, PA-C). She is concerned about Heather worsening symptoms she has been experiencing, as they are affecting her ability to eat. We received a note from ENT questioning whether her symptoms might be associated with Heather progressive metastatic disease in Heather right anterior temporal  bone with questionable involvement of Heather right temporal muscle.  In light of these symptoms, Dr. [name] reviewed her most recent MRI scan at tumor board and Heather recommendation was to proceed with a 5 fraction course of SRS to Heather right temporal skull metastasis with brain and masticator invasion, which was completed on March 19, 2020.   INTERVAL HISTORY:   I spoke with Heather Heather [name] to conduct her routine scheduled 3 month follow up visit to review Heather most recent MRI brain scan via telephone to spare Heather Heather [name] unnecessary potential exposure in Heather healthcare setting during Heather current COVID-19 pandemic.  Heather Heather [name] was notified in advance and gave permission to proceed with this visit format.   She reports that she has continued doing well overall and has recovered well from Heather effects of her most recent SRS.  She denies headaches, dizziness, N/V or visual changes. Her recent MRI brain from 05/13/20 showed marked progression of osseous metastatic disease throughout Heather calvarium, skull base and upper cervical spine. Heather left eyebrow lesion is due to progressive bony metastatic disease in Heather calvarium extending into Heather lateral orbital rim and there is some encroachment upon Heather extra conal orbital soft tissues on Heather left. There is no evidence of parenchymal brain metastatic disease or signs of leptomeningeal spread. She has noted some decreased visual acuity but reports that this seems to be bilateral. She has an appointment with her ophthalmologist in 06/2020. She denies diplopia or nystagmus and is not having frequent headaches.  She also had recent repeat systemic imaging with CT C/A/P 05/19/20 confirming disease progression with interval development of multiple new lytic bone lesions within Heather lumbar spine, bony pelvis and proximal femurs, compatible with progression of disease. This includes, but not limited to: a lesion within Heather right iliac wing measuring 2.2 cm, a lesion within Heather left iliac  wing measuring 1.7 cm and a lesion within Heather proximal left femur measuring 1.3 cm.  There was also a mild increase in size and appearance of enhancing pre-vascular lymph nodes within Heather anterior mediastinum.  A bone scan performed Heather same day confirmed progressive osseous metastatic disease as compared to prior imaging from 01/01/20 with new lesions seen in Heather calvaria, ribs, distal right humerus, L3 vertebral body and distal left femur.  She met with Dr. Lindi [name] earlier today and Heather recommendation was to discontinue her current Lapatinib and anastrazole systemic therapy and switch to Xeloda and Tucatinib which she will start in Heather near future.   On review of systems, Heather Heather [name] reports that she continues doing well overall.  She continues with moderate fatigue which  is tolerable and not significantly interfering with her daily activities aside from Heather first 2 weeks after systemic treatments.  She has tolerated her systemic therapy but will be switching to a new treatment regimen in Heather near future.  She has persistent low grade nausea but no vomiting and is able to eat.  She remains as active as she can be.  She has noted continued improvement in Heather size of Heather subcutaneous nodules and in Heather burning/tingling pain that radiated from Heather left aspect of Heather spine underneath her breast since completing radiotherapy. She has not had recent changes in auditory acuity, tinnitus, increased imbalance, tremor or seizure activity. She has noted some decreased visual acuity but reports that this seems to be bilateral. She has an appointment with her ophthalmologist in 06/2020. She denies diplopia or nystagmus and is not having frequent headaches. She continues with a palpable, raised "knot" over her left eyebrow that is intermittently sore but does not seem to be enlarging rapidly.  She denies sternal chest pain, shortness of breath, cough, fevers, chills, night sweats, or recent unintended weight changes. She denies  any bowel or bladder disturbances, and denies abdominal pain or vomiting currently. She denies any new musculoskeletal or joint aches or pains and reports that Heather right hip/thigh pain that she was having has improved greatly since completing Heather palliative XRT in 02/2020. Heather pain no longer wakes her from sleep at night and she denies any numbness, tingling or increased focal weakness in Heather lower extremities. She has continued with pain in Heather left hip/thigh that is exacerbated with activities but managed with Tylenol prn. She also has chronic, diffuse bony pains that have worsened over Heather past 2-3 months. She also has chronic weakness bilaterally in Heather lower extremities, unchanged recently. A complete review of systems is obtained and is otherwise negative.  Past Medical History:  Past Medical History:  Diagnosis Date  . Anemia    hx of  . Anxiety    r/t updated surgery  . Breast cancer ([place])    right  . Cataract    immature-not sure of which eye  . Complication of anesthesia    pt states she is sensitive to meds  . Dizziness    has been going on for 84month;medical MD aware  . Genetic testing 07/11/2016   Test Results: Normal; No pathogenic mutations detected  Genes Analyzed: 43 genes on [employer] Common Cancers panel (APC, ATM, AXIN2, BARD1, BMPR1A, BRCA1, BRCA2, BRIP1, CDH1, CDKN2A, CHEK2, DICER1, EPCAM, GREM1, HOXB13, KIT, MEN1, MLH1, MSH2, MSH6, MUTYH, NBN, NF1, PALB2, PDGFRA, PMS2, POLD1, POLE, PTEN, RAD50, RAD51C, RAD51D, SDHA, SDHB, SDHC, SDHD, SMAD4, SMARCA4, STK11, TP53, TSC1, TSC2, VHL).  .Marland KitchenGERD (gastroesophageal reflux disease)    Tums prn  . H. pylori infection   . H/O hiatal hernia   . Hemorrhoids   . History of UTI   . Hx of radiation therapy 10/25/12- 12/11/12   right chest wall/regional lymph nodes 5040 cGy, 28 sessions, right chest wall boost 1000 cGy 1 session  . Hx: UTI (urinary tract infection)   . Hyperlipidemia    but not on meds;diet and exercise controlled  .  Hypertension    recently started Aldactone   . Insomnia    takes Melatoniin daily  . Metastasis to spinal column ([facility])    T5, Left  Femur cancer- radiation   . Neuropathy    "from back surgery"  . PONV (postoperative nausea and vomiting)    pt experienced hair  loss, confusion and combative- 02/15/15  . Right knee pain   . Right shoulder pain   . Skin cancer    squamous, Nodule to back - "same cancer as breast."  . Urinary frequency    d/t taking Aldactone    Past Surgical History: Past Surgical History:  Procedure Laterality Date  . COLONOSCOPY    . cyst removed from left breast  1970  . DECOMPRESSIVE LUMBAR LAMINECTOMY LEVEL 4 N/A 02/15/2015   Procedure: DECOMPRESSION T5 AND T3-T7 STABALIZATION;  Surgeon: Consuella Lose, MD;  Location: [place] NEURO ORS;  Service: Neurosurgery;  Laterality: N/A;  . ESOPHAGOGASTRODUODENOSCOPY    . LAMINECTOMY N/A 03/27/2015   Procedure: Thoracic Four-Thoracic Six Laminectomy for tumor;  Surgeon: Consuella Lose, MD;  Location: [place] NEURO ORS;  Service: Neurosurgery;  Laterality: N/A;  T4-T6 Laminectomy  . left wrist surgery   2004   with plate  . MASTECTOMY MODIFIED RADICAL  05/10/2012   Procedure: MASTECTOMY MODIFIED RADICAL;  Surgeon: Merrie Roof, MD;  Location: [place];  Service: General;  Laterality: Right;  RIGHT MODIFIED RADICAL MASTECTOMY  . MASTECTOMY, RADICAL Right   . MINOR BREAST BIOPSY Left 05/16/2016   Procedure: EXCISION OF BACK NODULE;  Surgeon: Autumn Messing III, MD;  Location: [place];  Service: General;  Laterality: Left;  . Nodule  back  05/17/2015  . PORTACATH PLACEMENT  05/10/2012   Procedure: INSERTION PORT-A-CATH;  Surgeon: Merrie Roof, MD;  Location: [facility] OR;  Service: General;  Laterality: Left;    Social History:  Social History   Socioeconomic History  . Marital status: Married    Spouse name: Not on file  . Number of children: 2  . Years of education: Not on file  . Highest education level: Not on  file  Occupational History  . Not on file  Tobacco Use  . Smoking status: Never Smoker  . Smokeless tobacco: Never Used  Vaping Use  . Vaping Use: Never used  Substance and Sexual Activity  . Alcohol use: No  . Drug use: No  . Sexual activity: Yes    Birth control/protection: Post-menopausal  Other Topics Concern  . Not on file  Social History Narrative  . Not on file   Social Determinants of Health   Financial Resource Strain: Not on file  Food Insecurity: Not on file  Transportation Needs: Not on file  Physical Activity: Not on file  Stress: Not on file  Social Connections: Not on file  Intimate Partner Violence: Not on file    Family History: Family History  Problem Relation Age of Onset  . Leukemia Mother 37       deceased 77  . Stroke Father   . Diabetes Mellitus II Father   . Prostate cancer Brother 40       currently 46  . Stomach cancer Maternal Grandmother 59       deceased 75  . Prostate cancer Brother 39       currently 44     ALLERGIES:  is allergic to other, acyclovir and related, chlorhexidine, monascus purpureus went yeast, pravastatin sodium, zanaflex [tizanidine hcl], codeine, keflex [cephalexin], naprosyn [naproxen], oxycodone, tramadol, and tussin [guaifenesin].  Meds: Current Outpatient Medications  Medication Sig Dispense Refill  . acetaminophen (TYLENOL) 500 MG tablet Take 500 mg by mouth every 6 (six) hours as needed.    . ALPRAZolam (XANAX) 0.25 MG tablet Take 1 tablet by mouth as needed.    Marland Kitchen anastrozole (ARIMIDEX) 1  MG tablet Take 1 tablet (1 mg total) by mouth daily. 90 tablet 3  . BOOSTRIX 5-2.5-18.5 LF-MCG/0.5 injection     . capecitabine (XELODA) 500 MG tablet Take 2 tablets (1,000 mg total) by mouth 2 (two) times daily after a meal. 56 tablet 6  . Cholecalciferol (VITAMIN D) 2000 UNITS tablet Take 2,000 Units by mouth daily.    . COLLAGEN PO Take by mouth.    . Cranberry 250 MG CAPS Take by mouth.    . cycloSPORINE (RESTASIS)  0.05 % ophthalmic emulsion 1 drop 2 (two) times daily. (Heather [name] not taking: Reported on 01/07/2020)    . fentaNYL (DURAGESIC) 25 MCG/HR Place 1 patch onto Heather skin every 3 (three) days. 10 patch 0  . fluticasone (FLONASE) 50 MCG/ACT nasal spray  (Heather [name] not taking: Reported on 04/15/2020)    . gabapentin (NEURONTIN) 600 MG tablet Take 1 tablet (600 mg total) by mouth as directed. Take 1 tablet in Heather AM and afternoon and 1 1/2 tabs at bedtime 315 tablet 2  . HYDROcodone-acetaminophen (NORCO/VICODIN) 5-325 MG tablet Take 1 tablet by mouth every 6 (six) hours as needed for moderate pain. (Heather [name] not taking: Reported on 01/07/2020) 30 tablet 0  . lapatinib (TYKERB) 250 MG tablet Take 4 tablets (1,000 mg total) by mouth daily. Take on an empty stomach, at least 1 hour before or 1 hour after meals. 120 tablet 3  . lidocaine-prilocaine (EMLA) cream Apply to affected area once 30 g 3  . lidocaine-prilocaine (EMLA) cream Apply to affected area once 30 g 3  . omeprazole (PRILOSEC) 40 MG capsule TAKE 1 CAPSULE BY MOUTH ONCE DAILY FOR 30 DAYS  5  . ondansetron (ZOFRAN) 8 MG tablet Take 1 tablet (8 mg total) by mouth every 8 (eight) hours as needed for nausea or vomiting. 20 tablet 0  . OVER Heather COUNTER MEDICATION Place 1 drop into both eyes 2 (two) times daily as needed (dry eyes). Over Heather counter lubricating eye drops    . polyethylene glycol (MIRALAX / GLYCOLAX) packet Take 17 g by mouth daily as needed.    . polyethylene glycol powder (GLYCOLAX/MIRALAX) 17 GM/SCOOP powder Take by mouth.    . Probiotic Product (PROBIOTIC DAILY PO) Take 1 capsule by mouth daily.  (Heather [name] not taking: Reported on 01/07/2020)    . prochlorperazine (COMPAZINE) 10 MG tablet Take 1 tablet (10 mg total) by mouth every 6 (six) hours as needed for nausea or vomiting. 60 tablet 3  . Prochlorperazine Maleate (COMPAZINE PO)     . spironolactone (ALDACTONE) 25 MG tablet Take 25 mg by mouth daily.    . tucatinib (TUKYSA) 150 MG tablet Take  2 tablets (300 mg total) by mouth in Heather morning and at bedtime. Take every 12 hrs at Heather same time each day with or without a meal. 120 tablet 6   No current facility-administered medications for this encounter.   Facility-Administered Medications Ordered in Other Encounters  Medication Dose Route Frequency Provider Last Rate Last Admin  . cyanocobalamin ((VITAMIN B-12)) injection 1,000 mcg  1,000 mcg Intramuscular Q6 weeks Heather Lose, MD   1,000 mcg at 03/19/20 1110  . heparin lock flush 100 unit/mL  500 Units Intracatheter Once Heather Lose, MD      . sodium chloride flush (NS) 0.9 % injection 10 mL  10 mL Intracatheter Once Heather Lose, MD        Physical Findings:  Unable to assess due to telephone follow-up visit format.   Lab Findings:  Lab Results  Component Value Date   WBC 4.3 05/19/2020   HGB 13.4 05/19/2020   HCT 40.2 05/19/2020   MCV 86.6 05/19/2020   PLT 109 (L) 05/19/2020     Radiographic Findings: MR Brain W Wo Contrast  Result Date: 05/13/2020 CLINICAL DATA:  Palpable knot Heather left eyebrow. Treated metastatic breast cancer. EXAM: MRI HEAD WITHOUT AND WITH CONTRAST TECHNIQUE: Multiplanar, multiecho pulse sequences of Heather brain and surrounding structures were obtained without and with intravenous contrast. CONTRAST:  83m MULTIHANCE GADOBENATE DIMEGLUMINE 529 MG/ML IV SOLN COMPARISON:  01/02/2020 FINDINGS: Brain: Diffusion imaging does not show any acute or subacute infarction. No abnormality affects Heather brainstem or cerebellum. Cerebral hemispheres do not show any parenchymal metastatic lesions. No sign of recent infarction, hydrocephalus or extra-axial collection. Vascular: Major vessels at Heather base of Heather brain show flow. Skull and upper cervical spine: Marked progression of metastatic lesions throughout Heather calvarium, skull base and upper cervical spine. Left eyebrow lesion is due to progressive bony metastatic disease in Heather calvarium extending into Heather lateral  orbital rim. Sinuses/Orbits: Sinuses are clear. Tumor described above involving Heather left lateral orbital rim does show some encroachment upon Heather extra conal orbit on Heather left. Other: None IMPRESSION: Marked progression of osseous metastatic disease throughout Heather calvarium, skull base and upper cervical spine. Left eyebrow lesion is due to progressive bony metastatic disease in Heather calvarium extending into Heather lateral orbital rim. There is some encroachment upon Heather extra conal orbital soft tissues on Heather left. No evidence of parenchymal brain metastatic disease presently. No sign of leptomeningeal spread. Electronically Signed   By: MNelson ChimesM.D.   On: 05/13/2020 13:09   NM Bone Scan Whole Body  Result Date: 05/19/2020 CLINICAL DATA:  Metastatic breast cancer, staging, new pain in both legs, BILATERAL rib pain RIGHT greater than LEFT, BILATERAL hip pain LEFT greater than RIGHT, coccygeal pain EXAM: NUCLEAR MEDICINE WHOLE BODY BONE SCAN TECHNIQUE: Whole body anterior and posterior images were obtained approximately 3 hours after intravenous injection of radiopharmaceutical. RADIOPHARMACEUTICALS:  20.6 mCi Technetium-939mDP IV COMPARISON:  01/01/2020 Correlation: CT chest abdomen pelvis 05/19/2020 FINDINGS: Multiple sites of abnormal osseous tracer accumulation consistent with widespread osseous metastatic disease. Sites of increased have increased in in number since Heather previous exam. Multiple foci of uptake are seen in Heather calvarium, BILATERAL ribs, thoracic and lumbar spine, pelvis, mid sternum, distal RIGHT humeral diaphysis, and BILATERAL femora. When compared to Heather previous study, new lesions are seen within Heather calvaria, ribs, RIGHT humerus, L3 vertebral body, and distal LEFT femur. Expected urinary tract and soft tissue distribution of tracer. IMPRESSION: Progressive osseous metastatic disease since 01/01/2020. Electronically Signed   By: MaLavonia Dana.D.   On: 05/19/2020 16:45   CT CHEST ABDOMEN  PELVIS W CONTRAST  Result Date: 05/19/2020 CLINICAL DATA:  Restaging metastatic breast cancer. EXAM: CT CHEST, ABDOMEN, AND PELVIS WITH CONTRAST TECHNIQUE: Multidetector CT imaging of Heather chest, abdomen and pelvis was performed following Heather standard protocol during bolus administration of intravenous contrast. CONTRAST:  10035mMNIPAQUE IOHEXOL 300 MG/ML  SOLN COMPARISON:  01/01/2020 FINDINGS: CT CHEST FINDINGS Cardiovascular: Heather heart size appears within normal limits. No pericardial effusion. Mild aortic atherosclerosis. Mediastinum/Nodes: Normal appearance of Heather thyroid gland. Heather trachea appears patent and is midline. Normal appearance of Heather esophagus. No enlarged axillary, supraclavicular lymph nodes. Enhancing pre-vascular lymph nodes are again noted. Index lymph node measures 1.2 cm, image 22/2. Previously 0.7 cm. No hilar adenopathy. Lungs/Pleura: Paramediastinal radiation change  is again identified within Heather posterior right lower lobe and bilateral paramediastinal upper lobes. Small cluster of sub solid left lower lobe lung nodules are unchanged measuring up to 5 mm, image 79/4. Favor benign etiology. Scarring is noted within Heather posterior lower lobes bilaterally Musculoskeletal: Previous right mastectomy and right axillary nodal dissection. There is a new 7 mm lytic lesion within Heather inferior glenoid of Heather left scapula, image 19/4. New asymmetric cortical lucency within Heather body of scapula are identified, image 73/5. Similar appearance of mixed lytic and sclerotic metastasis involve T12. Severe pathologic fracture involving T5 with associated kyphosis deformity is unchanged. Status post T2 through L1 posterior spinal fixation. Multiple new permeative rib lesions are identified bilaterally including (but not limited to) Heather posterior aspect Heather 6th rib, image 38/4 and lateral aspect Heather left 5th rib, image 56/4. CT ABDOMEN PELVIS FINDINGS Hepatobiliary: No suspicious liver lesion. Tiny stones within  Heather dependent portion of Heather gallbladder are again noted. No signs of gallbladder wall inflammation or bile duct dilatation. Pancreas: Unremarkable. No pancreatic ductal dilatation or surrounding inflammatory changes. Spleen: Normal in size without focal abnormality. Adrenals/Urinary Tract: Normal adrenal glands. No kidney mass or hydronephrosis identified. Urinary bladder is unremarkable. Stomach/Bowel: Small hiatal hernia. Heather appendix is visualized and appears normal. No bowel wall thickening, inflammation, or distension. Vascular/Lymphatic: No aneurysm. Aortic atherosclerosis. Small distal esophageal varices identified. No abdominopelvic adenopathy. Reproductive: Uterus and bilateral adnexa are unremarkable. Other: No free fluid or fluid collections Musculoskeletal: Interval development of multiple new lucent bone lesions within Heather lumbar spine, bony pelvis and proximal femurs compatible with progression of disease. This includes: (But not limited to) index lesion within Heather right iliac wing measures 2.2 cm, image 86/2. Index lesion within Heather left iliac wing measures 1.7 cm. Index lesion within Heather proximal left femur measures 1.3 cm, image 106/2. IMPRESSION: 1. Interval development of multiple new lytic bone lesions within Heather axial and proximal appendicular skeleton. 2. Similar appearance of mixed lytic and sclerotic metastasis involving T12 and T5. 3. Mild increase in size of appearance of enhancing pre-vascular lymph nodes within Heather anterior mediastinum. 4. Aortic atherosclerosis. 5. Gallstones. Aortic Atherosclerosis (ICD10-I70.0). Electronically Signed   By: Kerby Moors M.D.   On: 05/19/2020 14:00    Impression/Plan:  This visit was conducted via telephone to spare Heather Heather [name] unnecessary potential exposure in Heather healthcare setting during Heather current COVID-19 pandemic. 16. 75 yo woman with ER positive breast cancer with metastatic disease to Heather brain, bones and spine.  She appears to have  recovered well from Heather effects of her recent palliative radiation to Heather skull and right hip and is currently without complaints.  We reviewed her most recent brain MRI and confirmed that Heather left eyebrow lesion is due to progressive bony metastatic disease in Heather calvarium extending into Heather lateral orbital rim and there is some encroachment upon Heather extra conal orbital soft tissues on Heather left but there is no evidence of parenchymal brain metastatic disease or signs of leptomeningeal spread. After a lengthy discussion regarding options for management of this lesion and Heather associated side effects, she prefers to give Heather new systemic therapy that she will be starting very soon a chance and see if she has a significant response with this treatment.  If Heather lesion continues to progress despite Heather change in systemic therapy or becomes more bothersome in Heather interim, she will call us and we will move forward with a 2 week course of palliative XRT to this  site at Heather left brow.  Otherwise, we discussed Heather plan to obtain a restaging MRI brain in approximately 3 months to continue to monitor Heather skull metastases for any evidence of disease progression.  Regarding her systemic disease, she will continue with routine follow-up for disease management under Heather care and direction of Dr. Lindi [name].    2. Spine metastases.  She has recovered well from Heather effects of her prior SRS treatments to T12 and L2 and is currently without complaints.  She continues to prefer to have this followed with systemic imaging under Heather care and direction of Dr. Lindi [name] and only repeat spine MRI if she becomes symptomatic.  We have previously discussed that CT imaging is not Heather ideal imaging modailty for evaluation of Heather spine but certainly understand her concerns for limiting Heather number of imaging studies that she is subjected to on a routine basis.  She understands and accepts Heather associated risks of missing a very small spinal lesion on  CT.  Her recent CT C/A/P imaging from 07/02/19 showed continued disease stability with no new or progressive interval findings. Heather previously treated metastases at T12 and L2 appear stable and there are no new osseous lesions identified. She is a most reliable Heather [name] and will let us know if she becomes symptomatic in Heather spine so that we can more thoroughly evaluate with MRI if/when necessary.  She appears to have a good understanding of her overall disease and our recommendations and is in agreement with Heather stated plan.  She knows to call at anytime in Heather interim with any questions or concerns.  Given current concerns for Heather [name] exposure during Heather COVID-19 pandemic, this encounter was conducted via telephone. Heather Heather [name] was notified in advance and was offered a [place] meeting to allow for face to face communication but unfortunately reported that she did not have Heather appropriate resources/technology to support such a visit and instead preferred to proceed with telephone consult. Heather Heather [name] has given verbal consent for this type of encounter. Heather time spent during this encounter was 25 minutes with approximately 50% of that time spent in counseling and coordination of care. Heather attendants for this meeting include Karlisa Gaubert PA-C, and Heather [name], Marjie Chea. During Heather encounter, Thierry Dobosz PA-C was located at [facility] Radiation Oncology Department.  Heather [name], Katerine Morua was located at home.   Nicholos Johns, PA-C

## 2020-05-21 NOTE — Telephone Encounter (Signed)
Oral Oncology Patient Advocate Encounter  Prior Authorization for Xeloda has been approved through Medicare B   PA# [id number] Effective dates: 05/21/20 through 05/08/21  Patients co-pay is $71.21  Oral Oncology Clinic will continue to follow.   [place] Patient Heather [name] Phone [phone] Fax [phone] 05/21/2020 11:44 AM

## 2020-05-21 NOTE — Telephone Encounter (Signed)
Oral Oncology Patient Advocate Encounter  Received notification from [facility] that prior authorization for Laury Axon is required.  PA submitted on CoverMyMeds Key [id number] Status is pending  Oral Oncology Clinic will continue to follow.  [place] Patient [place] Phone [phone] Fax [phone] 05/21/2020 11:40 AM

## 2020-05-21 NOTE — Telephone Encounter (Signed)
Oral Oncology Pharmacist Encounter  Received new prescriptions for Tukysa (tucatinib) and Xeloda (capecitabine) for the treatment of ER positive/PR negative, HER2 positive metastatic breast cancer in conjunction with trastuzumab, planned duration until disease progression or unacceptable drug toxicity.  Prescription dose and frequency assessed for appropriateness. Appropriate for therapy initiation.   CMP and CBC w/ Diff from 05/19/20 assessed, noted Scr of 1.16 mg/dL (CrCl ~49 mL/min) - Xeloda dose ~628 mg/m2, no further dose reductions required. T. Bili slightly elevated at 1.4 mg/dL - no hepatic dose adjustments required. Noted patient hypercalcemic, s/p zometa and IV fluids.   Current medication list in Epic reviewed, DDIs with [place] and Xeloda identified:  Concomitant use of Tukysa and Xanax not recommended due to likelihood of increased concentrations of Xanax from Tukysa inhibition of CYP3A4. Will confirm with patient if she still utilizes Xanax as Rx on file is from 09/2016.   Category D DDI between Tukysa and Fentanyl - Tukysa, a strong CYP3A4 inhibitor can increase concentrations of fentanyl, leading to increased risk of ADEs including sedation/lethargy. Patient not opioid naive, but will counsel on monitoring for over sedation.   Category C DDI between [place] and Hydrocodone/APAP - Tukysa can increase serum concentrations of Hydrocodone/APAP through CYP3A4 inhibition similar to that as mentioned above with Fentanyl. No change in therapy warranted at this time, but patient will counsel patient on monitoring for  increased lethary/sedation Category C DDI between Xeloda and Omeprazole - proton-pump inhibitors can decrease efficacy of Xeloda - will discuss with patient alternatives to omeprazole, such as H2RA's like famotidine while on Xeloda.  Evaluated chart and no patient barriers to medication adherence noted.   Prescription has been e-scribed to the [facility] for  benefits analysis and approval.  Oral Oncology Clinic will continue to follow for insurance authorization, copayment issues, initial counseling and start date.  Leron Croak, PharmD, BCPS Hematology/Oncology Clinical Pharmacist [facility] [phone] 05/21/2020 12:38 PM

## 2020-05-21 NOTE — Telephone Encounter (Signed)
Oral Oncology Patient Advocate Encounter  Received notification from [facility] that prior authorization for Xeloda is required.  PA submitted on CoverMyMeds Key [id number] Status is pending  Oral Oncology Clinic will continue to follow.   [place] Patient [place] Phone [phone] Fax [phone] 05/21/2020 11:41 AM

## 2020-05-22 ENCOUNTER — Telehealth: Payer: Self-pay

## 2020-05-22 NOTE — Progress Notes (Signed)
Pharmacist Chemotherapy Monitoring - Initial Assessment    Anticipated start date: 05/29/20       Regimen:  . Are orders appropriate based on the patient's diagnosis, regimen, and cycle? Yes . Does the plan date match the patient's scheduled date? Yes . Is the sequencing of drugs appropriate? Yes . Are the premedications appropriate for the patient's regimen? Yes . Prior Authorization for treatment is: Pending o If applicable, is the correct biosimilar selected based on the patient's insurance? not applicable  Organ Function and Labs: Marland Kitchen Are dose adjustments needed based on the patient's renal function, hepatic function, or hematologic function? Yes . Are appropriate labs ordered prior to the start of patient's treatment? Yes . Other organ system assessment, if indicated: trastuzumab: Echo/ MUGA . The following baseline labs, if indicated, have been ordered: N/A  Dose Assessment: . Are the drug doses appropriate? Yes . Are the following correct: o Drug concentrations Yes o IV fluid compatible with drug Yes o Administration routes Yes o Timing of therapy Yes . If applicable, does the patient have documented access for treatment and/or plans for port-a-cath placement? no . If applicable, have lifetime cumulative doses been properly documented and assessed? no Lifetime Dose Tracking  . Carboplatin: 3,160 mg = 0.01 % of the maximum lifetime dose of 999,999,999 mg  o   Toxicity Monitoring/Prevention: . The patient has the following take home antiemetics prescribed: N/A . The patient has the following take home medications prescribed: N/A . Medication allergies and previous infusion related reactions, if applicable, have been reviewed and addressed. Yes . The patient's current medication list has been assessed for drug-drug interactions with their chemotherapy regimen. no significant drug-drug interactions were identified on review.  Order Review: . Are the treatment plan orders signed?  Yes . Is the patient scheduled to see a provider prior to their treatment? Yes  I verify that I have reviewed each item in the above checklist and answered each question accordingly.  Heather [name] K 05/22/2020 8:50 AM

## 2020-05-22 NOTE — Telephone Encounter (Signed)
Oral Oncology Patient Advocate Encounter  Met patient in lobby to complete application for [employer] in an effort to reduce patient's out of pocket expense for Tukysa to $0.    Application completed and faxed to [phone].   [employer] patient assistance phone number for follow up is [phone].   This encounter will be updated until final determination.   [place] Patient [place] Phone [phone] Fax [phone] 05/22/2020 3:14 PM

## 2020-05-22 NOTE — Progress Notes (Signed)
Pharmacist Chemotherapy Monitoring - Initial Assessment    Anticipated start date: 05/29/20   Regimen:  . Are orders appropriate based on the patient's diagnosis, regimen, and cycle? Yes . Does the plan date match the patient's scheduled date? Yes . Is the sequencing of drugs appropriate? Yes . Are the premedications appropriate for the patient's regimen? Yes . Prior Authorization for treatment is: Approved o If applicable, is the correct biosimilar selected based on the patient's insurance? yes  Organ Function and Labs: Marland Kitchen Are dose adjustments needed based on the patient's renal function, hepatic function, or hematologic function? Yes . Are appropriate labs ordered prior to the start of patient's treatment? Yes . Other organ system assessment, if indicated: N/A and trastuzumab: Echo/ MUGA . The following baseline labs, if indicated, have been ordered:   Dose Assessment: . Are the drug doses appropriate? Yes . Are the following correct: o Drug concentrations Yes o IV fluid compatible with drug Yes o Administration routes Yes o Timing of therapy Yes . If applicable, does the patient have documented access for treatment and/or plans for port-a-cath placement? not applicable . If applicable, have lifetime cumulative doses been properly documented and assessed? not applicable Lifetime Dose Tracking  . Carboplatin: 3,160 mg = 0.01 % of the maximum lifetime dose of 999,999,999 mg  o   Toxicity Monitoring/Prevention: . The patient has the following take home antiemetics prescribed: Ondansetron and Prochlorperazine . The patient has the following take home medications prescribed: N/A . Medication allergies and previous infusion related reactions, if applicable, have been reviewed and addressed. No . The patient's current medication list has been assessed for drug-drug interactions with their chemotherapy regimen. no significant drug-drug interactions were identified on review.  Order  Review: . Are the treatment plan orders signed? Yes . Is the patient scheduled to see a provider prior to their treatment? Yes  I verify that I have reviewed each item in the above checklist and answered each question accordingly.  Ascencion Stegner D 05/22/2020 10:27 AM

## 2020-05-26 ENCOUNTER — Encounter: Payer: Self-pay | Admitting: General Practice

## 2020-05-26 NOTE — Progress Notes (Signed)
[facility] Spiritual Care Note  Phoned Heather [name] on behalf of Counseling Intern Heather [name] to postpone tomorrow's 10:00 appointment. Heather [name] will call to reschedule, hopefully by Thursday. Also provided introduction to Heather [name], as well as pastoral listening as Heather [name] shared about how she is processing the progression of her disease and the new need for pain management, which stirs up worries and anxiety for her. She is grateful to know of ongoing chaplain availability as an extra layer of support and has my direct dial number.   [place], [place], [facility] Pager [phone] Voicemail [phone]

## 2020-05-26 NOTE — Assessment & Plan Note (Signed)
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 MRI 02/14/2015: T5 large destructive lesion with pathologic compression fracture and extensive epidural tumor, involvement of T4, excision metastatic carcinoma ER 60%, PR 0%, HER-2 positive ratio 2.71 average copy #7.45 03/27/2015: T4-6 decompression surgery for spinal cord compression PET-CT 12/10/17Rt para-spinal mass mildy hypermetabolic SUV 5.9, lytic cortical lesion 4.8 cm lesion left prox femur suv 8.8 favor osseus met disease Echo 04/12/2017 shows well preserved ejection fraction of 55-60%  Treatment summary: 1. Resumed anastrozole 05/21/2015, but it has caused significant hair loss so we switched her to exemestane 25 mg once daily 07/23/2015 2. Herceptin every 3 week started 04/10/2015 3. Bone metastases: Zometa every 3 months 4. Radiation therapy to the spine (Dr. [name]) 04/13/2015 to 05/19/2015 5. Radiation therapy to left femur 04/28/2016 to 05/12/2016  6. SRS to the brain lesion 11/24/2016 7.Kadcyla started 05/05/2017 discontinued 07/27/2017 8.Palbociclib +Faslodex +trastuzumab+ Pertuzumabstarted 08/10/2017, stopped 04/04/2018 9.XRT to T123/04/2019 10. Enhertu started 06/01/18-12/27/2019 stopped for progression 11SRS to brain January 2021 12.Lapatinib with anastrozole -------------------------------------------------------------------------------------------------------------------------- 05/19/2020: Bone scan: Progressive bone metastatic disease, bilateral ribs, thoracic and lumbar spine, pelvis, sternum, right humeral diaphysis, bilateral femoral, new lesions are seen calvaria, ribs, right humerus, L3 vertebral body and distal left femur  05/19/2020: CT CAP: New lytic bone lesion, similar lesions T12 and  L5, mild increase in the prevascular lymph nodes  Treatment plan: Xeloda, Herceptin with Tucatinib Toxicities:  Hypercalcemia: Previously received Zometa and IV fluids. Diffuse pains throughout her body: Currently on fentanyl patch 25 mcg  Return to clinic every 3 weeks for Herceptin treatments

## 2020-05-26 NOTE — Telephone Encounter (Signed)
Patient is approved for Tukysa at no charge from [facility] 05/26/20-05/08/21  [place] Patient [place] Phone [phone] Fax [phone] 05/26/2020 11:49 AM

## 2020-05-27 MED FILL — CAPECITABINE 500 MG TABLET: 500 | 21 days supply | Qty: 56 | Fill #0

## 2020-05-27 NOTE — Telephone Encounter (Signed)
Oral Chemotherapy Pharmacist Encounter  I spoke with patient for overview of new medication regimen consisting of Tukysa (tucatinib) and Xeloda (capecitabine) for the treatment of metastatic, HER-2 receptor positive breast cancer, in conjunction with infusional trastuzumab, planned duration until disease progression or unacceptable toxicity.  Counseled patient on administration, dosing, side effects, monitoring, drug-food interactions, safe handling, storage, and disposal.  Patient will take Xeloda 556m tablets, 2 tablets (10081m by mouth in AM and 2 tabs (100055mby mouth in PM, within 30 minutes of finishing meals, on days 1-14 of each 21 day cycle.   Patient will take Tukysa 150m42mblets, 2 tablets (300mg68m mouth twice daily, without regard to food, taken continuously.  Trastuzumab will be administered at standard dosing IV every 3 weeks.   Xeloda and Tukysa start date: 05/29/20  Adverse effects of Xeloda include but are not limited to: fatigue, decreased blood counts, GI upset, diarrhea, mouth sores, and hand-foot syndrome.  Adverse effects of Tukysa include but are not limited to: palmar-plantar erythrodysesthesia, increased liver enzymes, hepatotoxicity, nausea, vomiting, mouth sores, decreased appetite, diarrhea, and electrolyte abnormalities.  Patient has anti-emetic on hand and knows to take it if nausea develops.   Patient will obtain anti diarrheal and alert the office of 4 or more loose stools above baseline.   Reviewed with patient importance of keeping a medication schedule and plan for any missed doses. No barriers to medication adherence identified.  Medication reconciliation performed and medication/allergy list updated.   Discussed drug-drug interactions between TukysBasalteasing possible ADEs including but not limited to increase sedation, lethargy, respiratory depression of Xanax, Fentanyl and hydrocodone-acetaminophen. Patient stated that she is currently not taking  nor has filled Rx for Fentanyl patch. She stated she only takes half a Xanax as needed. She is currently using only the hydrocodone-acetaminophen PRN for pain. She stated she typically will take half a tablet.   Discussed drug-drug interaction between Xeloda and omeprazole, and that omeprazole can decrease efficacy of Xeloda. Patient stated she is not taking Xeloda, but taking Pepcid instead PRN heartburn.   InsurBiomedical engineerXeloda and TukysLaury Axon been obtained. Test claim at the pharmacy revealed copayments $71.21 for 1st fill of Xeloda. Patient[facility].  Patient approved for manufacturer assistance through [facility] will receive medication from their dispensing pharmacy, [facility] call and [facility].   Patient informed the pharmacy will reach out 5-7 days prior to needing next fills of Xeloda to coordinate continued medication acquisition to prevent break in therapy.  All questions answered.  Ms. Mcdonell Maiersed understanding and appreciation.   Medication education handouts and medication calendar placed in mail for patient. Patient knows to call the office with questions or concerns. Oral Chemotherapy Clinic phone number provided to patient.   RebecLeron CroakrmD, BCPS Hematology/Oncology Clinical Pharmacist [id number] 11:32 AM

## 2020-05-28 ENCOUNTER — Telehealth: Payer: Self-pay

## 2020-05-28 DIAGNOSIS — C50411 Malignant neoplasm of upper-outer quadrant of right female breast: Secondary | ICD-10-CM | POA: Diagnosis not present

## 2020-05-28 DIAGNOSIS — K219 Gastro-esophageal reflux disease without esophagitis: Secondary | ICD-10-CM | POA: Diagnosis not present

## 2020-05-28 DIAGNOSIS — C7951 Secondary malignant neoplasm of bone: Secondary | ICD-10-CM | POA: Diagnosis not present

## 2020-05-28 DIAGNOSIS — G894 Chronic pain syndrome: Secondary | ICD-10-CM | POA: Diagnosis not present

## 2020-05-28 NOTE — Progress Notes (Signed)
Patient Care Team: [name], MD as PCP - General (Family Medicine)  DIAGNOSIS:    ICD-10-CM   1. Primary cancer of upper outer quadrant of right female breast ([place])  C50.411     SUMMARY OF ONCOLOGIC HISTORY: Oncology History  Primary cancer of upper outer quadrant of right female breast ([place])  03/08/2012 Initial Diagnosis   Right breast invasive ductal carcinoma ER positive PR negative HER-2 positive Ki-67 44%; another breast mass biopsied in the anterior part of the breast which was also positive for malignancy that was HER-2 negative   03/14/2012 Cancer Staging   Staging form: Breast, AJCC 7th Edition - Pathologic: Stage IV (M1) - Signed by [name], NP on 06/22/2018   05/10/2012 Surgery   Right mastectomy and axillary lymph node dissection: Multifocal disease 5 cm, 1.7 cm, 1.6 cm, ER positive PR negative HER-2 positive Ki-67 44%, 3/7 lymph nodes positive   06/14/2012 - 06/12/2013 Chemotherapy   Adjuvant chemotherapy with Pierce 6 followed by Herceptin maintenance   10/25/2012 - 12/11/2012 Radiation Therapy   Adjuvant radiation therapy   02/07/2013 -  Anti-estrogen oral therapy   Letrozole 2.5 mg daily   02/14/2015 Imaging   MRI spine: Large destructive T5 lesion with severe pathologic compression fracture and extensive epidural tumor, severe spinal stenosis with moderate cord compression, tumor involvement of T4   03/18/2015 PET scan   Residual enhancing soft tissue adjacent to the spinal cord. No evidence of metastatic disease. Nonspecific uptake the left nipple   03/27/2015 - 03/30/2015 Hospital Admission   T4-6 decompression for spinal cord compression (lower extremity paralysis)   04/10/2015 -  Chemotherapy   Palliative treatment with Herceptin every 3 weeks with letrozole 2.5 mg daily   04/13/2015 - 05/19/2015 Radiation Therapy   Palliative radiation treatment to the spine   09/01/2015 Imaging   CT chest abdomen pelvis: Pathologic fracture with posterior  fusion at T5 no other evidence of metastatic disease in the chest abdomen pelvis   12/23/2015 Imaging   CT CAP: new nonspecific 0.6 cm lymph node was stated mediastinum needs follow-up CT, innumerable tiny groundglass pulmonary nodules throughout both lungs unchanged, patchy consolidation from radiation, T5 fracture, no mets   04/15/2016 PET scan   Rt para-spinal mass mildy hypermetabolic SUV 5.9, lytic cortical lesion 4.8 cm lesion left prox femur suv 8.8 favor osseus met disease   04/28/2016 - 05/12/2016 Radiation Therapy   Palliative XRT Left femur   05/24/2016 Procedure   Soft tissue mass biopsy back: Metastatic breast cancer ER 80%, PR 0%, Ki-67 50%, HER-2 positive ratio 2.25   06/09/2016 - 03/29/2017 Anti-estrogen oral therapy   Faslodex with Herceptin and Perjeta every 4 weeks   06/13/2016 - 06/15/2016 Hospital Admission   uncomplicated revision of previous thoracic instrumentation.   10/10/2016 PET scan   Interval development of new hypermetabolic 2.5 x 1.1 cm lesion posterior right eighth rib consistent with metastatic disease, stable right paraspinal lesion at T8, clustered soft tissue nodules in the posterior midline back near cervicothoracic junction smaller than before    11/03/2016 - 11/07/2016 Radiation Therapy   Palliative radiation to right eighth rib   11/04/2016 Imaging   Solitary contrast-enhancing lesion in the right cerebellum 6 x 2 mm concerning for metastatic lesion   11/24/2016 - 11/24/2016 Radiation Therapy   SRS to the brain lesion   04/18/2017 PET scan   New ill-defined rounded airspace opacity in posterior right lower lobe. Differential diagnosis includes malignancy and infectious/inflammatory process.Increased hypermetabolic lytic lesion  in T8 vertebral body, consistent with bone metastasis.  Increased hypermetabolic posterior upper chest wall subcutaneous nodules, highly suspicious metastatic disease. Further improvement in right posterior eighth rib metastasis.    05/04/2017 - 07/27/2017 Chemotherapy   Kadcyla every 3 weeks palliative chemotherapy stopped for progression   07/26/2017 Imaging   Interval increase in size of the multiple enhancing masses posteriorly overlying the spine, interval development of left axillary and mediastinal adenopathy, increase in the size of lytic lesions in T8-T9 and left ninth rib, increased groundglass opacities right lower lobe lung   07/27/2017 - 05/11/2018 Chemotherapy   Palbociclib + Faslodex + trastuzumab + Pertuzumab     04/03/2018 PET scan   PET CT scan showed progression of disease not only in the bones but also in the subcutaneous nodules.   05/15/2018 - 05/25/2018 Radiation Therapy   Palliative radiation to the back   06/01/2018 -  Chemotherapy   Enhertu (Transtuzumab Deruxtecan)    08/07/2018 - 08/07/2018 Radiation Therapy   T12 vertebral body SRS   Carcinoma of right breast, stage 4 (H[facility])  02/14/2015 Initial Diagnosis   Breast cancer metastasized to bone, right ([place])   06/01/2018 - 02/07/2020 Chemotherapy   The patient had palonosetron (ALOXI) injection 0.25 mg, 0.25 mg, Intravenous,  Once, 24 of 30 cycles Administration: 0.25 mg (06/01/2018), 0.25 mg (06/22/2018), 0.25 mg (08/24/2018), 0.25 mg (07/13/2018), 0.25 mg (08/03/2018), 0.25 mg (09/14/2018), 0.25 mg (10/05/2018), 0.25 mg (11/16/2018), 0.25 mg (10/26/2018), 0.25 mg (12/07/2018), 0.25 mg (12/28/2018), 0.25 mg (01/18/2019), 0.25 mg (03/15/2019), 0.25 mg (02/15/2019), 0.25 mg (04/12/2019), 0.25 mg (05/09/2019), 0.25 mg (06/07/2019), 0.25 mg (07/05/2019), 0.25 mg (08/09/2019), 0.25 mg (09/13/2019), 0.25 mg (10/18/2019), 0.25 mg (11/22/2019), 0.25 mg (12/27/2019) fam-trastuzumab deruxtecan-nxki (ENHERTU) 332 mg in dextrose 5 % 100 mL chemo infusion, 5.4 mg/kg = 332 mg, Intravenous,  Once, 24 of 30 cycles Dose modification: 4.4 mg/kg (original dose 5.4 mg/kg, Cycle 7, Reason: Provider Judgment), 3.2 mg/kg (original dose 5.4 mg/kg, Cycle 9, Reason: Dose not tolerated), 150 mg  (original dose 5.4 mg/kg, Cycle 18, Reason: Dose not tolerated) Administration: 332 mg (06/01/2018), 332 mg (06/22/2018), 332 mg (07/13/2018), 332 mg (08/24/2018), 332 mg (08/03/2018), 332 mg (09/14/2018), 272 mg (10/05/2018), 200 mg (11/16/2018), 272 mg (10/26/2018), 200 mg (12/07/2018), 200 mg (12/28/2018), 200 mg (01/18/2019), 200 mg (03/15/2019), 200 mg (02/15/2019), 200 mg (04/12/2019), 200 mg (05/09/2019), 200 mg (06/07/2019), 150 mg (07/05/2019), 150 mg (08/09/2019), 150 mg (09/13/2019), 150 mg (10/18/2019), 150 mg (11/22/2019), 150 mg (12/27/2019)  for chemotherapy treatment.    05/29/2020 -  Chemotherapy      Patient is on Antibody Plan: BREAST TRASTUZUMAB Q21D    Brain metastasis ([place])  11/18/2016 Initial Diagnosis   Brain metastasis ([place])   05/29/2020 -  Chemotherapy      Patient is on Antibody Plan: BREAST TRASTUZUMAB Q21D      CHIEF COMPLIANT: Follow-up of metastatic breast cancer  INTERVAL HISTORY: Heather [name] is a 75 y.o. with above-mentioned history of metastatic breast cancercurrently treatment with Xeloda and Tukysa and Herceptin. She presentsto the clinic todayfor a toxicity check.    ALLERGIES:  is allergic to other, acyclovir and related, chlorhexidine, monascus purpureus went yeast, pravastatin sodium, zanaflex [tizanidine hcl], codeine, keflex [cephalexin], naprosyn [naproxen], oxycodone, tramadol, and tussin [guaifenesin].  MEDICATIONS:  Current Outpatient Medications  Medication Sig Dispense Refill  . acetaminophen (TYLENOL) 500 MG tablet Take 500 mg by mouth every 6 (six) hours as needed.    . ALPRAZolam (XANAX) 0.25 MG tablet Take 1 tablet  by mouth as needed.    Marland Kitchen anastrozole (ARIMIDEX) 1 MG tablet Take 1 tablet (1 mg total) by mouth daily. 90 tablet 3  . BOOSTRIX 5-2.5-18.5 LF-MCG/0.5 injection     . capecitabine (XELODA) 500 MG tablet Take 2 tablets (1,000 mg total) by mouth 2 (two) times daily after a meal. Take for 14 days on, 7 days off, repeat every 21 days. 56 tablet 6  .  Cholecalciferol (VITAMIN D) 2000 UNITS tablet Take 2,000 Units by mouth daily.    . COLLAGEN PO Take by mouth.    . Cranberry 250 MG CAPS Take by mouth.    . cycloSPORINE (RESTASIS) 0.05 % ophthalmic emulsion 1 drop 2 (two) times daily. (Patient not taking: Reported on 01/07/2020)    . fentaNYL (DURAGESIC) 25 MCG/HR Place 1 patch onto the skin every 3 (three) days. 10 patch 0  . fluticasone (FLONASE) 50 MCG/ACT nasal spray  (Patient not taking: Reported on 04/15/2020)    . gabapentin (NEURONTIN) 600 MG tablet Take 1 tablet (600 mg total) by mouth as directed. Take 1 tablet in the AM and afternoon and 1 1/2 tabs at bedtime 315 tablet 2  . HYDROcodone-acetaminophen (NORCO/VICODIN) 5-325 MG tablet Take 1 tablet by mouth every 6 (six) hours as needed for moderate pain. (Patient not taking: Reported on 01/07/2020) 30 tablet 0  . lidocaine-prilocaine (EMLA) cream Apply to affected area once 30 g 3  . omeprazole (PRILOSEC) 40 MG capsule TAKE 1 CAPSULE BY MOUTH ONCE DAILY FOR 30 DAYS  5  . ondansetron (ZOFRAN) 8 MG tablet Take 1 tablet (8 mg total) by mouth every 8 (eight) hours as needed for nausea or vomiting. 20 tablet 0  . OVER THE COUNTER MEDICATION Place 1 drop into both eyes 2 (two) times daily as needed (dry eyes). Over the counter lubricating eye drops    . polyethylene glycol (MIRALAX / GLYCOLAX) packet Take 17 g by mouth daily as needed.    . Probiotic Product (PROBIOTIC DAILY PO) Take 1 capsule by mouth daily.  (Patient not taking: Reported on 01/07/2020)    . prochlorperazine (COMPAZINE) 10 MG tablet Take 1 tablet (10 mg total) by mouth every 6 (six) hours as needed for nausea or vomiting. 60 tablet 3  . spironolactone (ALDACTONE) 25 MG tablet Take 25 mg by mouth daily.    . tucatinib (TUKYSA) 150 MG tablet Take 2 tablets (300 mg total) by mouth in the morning and at bedtime. Take every 12 hrs at the same time each day with or without a meal. 120 tablet 6   No current facility-administered  medications for this visit.   Facility-Administered Medications Ordered in Other Visits  Medication Dose Route Frequency Provider Last Rate Last Admin  . cyanocobalamin ((VITAMIN B-12)) injection 1,000 mcg  1,000 mcg Intramuscular Q6 weeks [name], MD   1,000 mcg at 03/19/20 1110  . heparin lock flush 100 unit/mL  500 Units Intracatheter Once [name], MD      . sodium chloride flush (NS) 0.9 % injection 10 mL  10 mL Intracatheter Once [name], MD        PHYSICAL EXAMINATION: ECOG PERFORMANCE STATUS: 1 - Symptomatic but completely ambulatory  Vitals:   05/29/20 1022  BP: (!) 122/56  Pulse: 88  Resp: 17  Temp: 97.9 F (36.6 C)  SpO2: 97%   Filed Weights   05/29/20 1022  Weight: 122 lb 9.6 oz (55.6 kg)    LABORATORY DATA:  I have reviewed the data  as listed CMP Latest Ref Rng & Units 05/19/2020 03/19/2020 02/07/2020  Glucose 70 - 99 mg/dL 101(H) 75 96  BUN 8 - 23 mg/dL $Remove'18 15 16  'XHyoEzS$ Creatinine 0.44 - 1.00 mg/dL 1.16(H) 0.93 0.84  Sodium 135 - 145 mmol/L 136 139 139  Potassium 3.5 - 5.1 mmol/L 3.5 4.0 3.9  Chloride 98 - 111 mmol/L 98 99 104  CO2 22 - 32 mmol/L $RemoveB'29 29 26  'mButsJeB$ Calcium 8.9 - 10.3 mg/dL 13.8(HH) 10.7(H) 10.4(H)  Total Protein 6.5 - 8.1 g/dL 8.1 8.2(H) 7.4  Total Bilirubin 0.3 - 1.2 mg/dL 1.4(H) 1.5(H) 1.0  Alkaline Phos 38 - 126 U/L 171(H) 168(H) 125  AST 15 - 41 U/L 43(H) 35 35  ALT 0 - 44 U/L $Remo'18 20 22    'KfwBR$ Lab Results  Component Value Date   WBC 4.3 05/19/2020   HGB 13.4 05/19/2020   HCT 40.2 05/19/2020   MCV 86.6 05/19/2020   PLT 109 (L) 05/19/2020   NEUTROABS 3.4 05/19/2020    ASSESSMENT & PLAN:  Primary cancer of upper outer quadrant of right female breast ([place]) Right breast invasive ductal carcinoma ER positive PR negative HER-2 positive Ki-67 44% multifocal disease 3/7 lymph nodes positive T2 N1 M0 stage IIB status post adjuvant chemotherapy with TCH followed by Herceptin maintenance, adjuvant radiation therapy and Letrozole. MRI  02/14/2015: T5 large destructive lesion with pathologic compression fracture and extensive epidural tumor, involvement of T4, excision metastatic carcinoma ER 60%, PR 0%, HER-2 positive ratio 2.71 average copy #7.45 03/27/2015: T4-6 decompression surgery for spinal cord compression PET-CT 12/10/17Rt para-spinal mass mildy hypermetabolic SUV 5.9, lytic cortical lesion 4.8 cm lesion left prox femur suv 8.8 favor osseus met disease Echo 04/12/2017 shows well preserved ejection fraction of 55-60%  Treatment summary: 1. Resumed anastrozole 05/21/2015, but it has caused significant hair loss so we switched her to exemestane 25 mg once daily 07/23/2015 2. Herceptin every 3 week started 04/10/2015 3. Bone metastases: Zometa every 3 months 4. Radiation therapy to the spine (Dr. [name]) 04/13/2015 to 05/19/2015 5. Radiation therapy to left femur 04/28/2016 to 05/12/2016  6. SRS to the brain lesion 11/24/2016 7.Kadcyla started 05/05/2017 discontinued 07/27/2017 8.Palbociclib +Faslodex +trastuzumab+ Pertuzumabstarted 08/10/2017, stopped 04/04/2018 9.XRT to T123/04/2019 10. Enhertu started 06/01/18-12/27/2019 stopped for progression 11SRS to brain January 2021 12.Lapatinib with anastrozole -------------------------------------------------------------------------------------------------------------------------- 05/19/2020: Bone scan: Progressive bone metastatic disease, bilateral ribs, thoracic and lumbar spine, pelvis, sternum, right humeral diaphysis, bilateral femoral, new lesions are seen calvaria, ribs, right humerus, L3 vertebral body and distal left femur  05/19/2020: CT CAP: New lytic bone lesion, similar lesions T12 and L5, mild increase in the prevascular lymph nodes  Treatment plan: Xeloda, Herceptin with Tucatinib Today cycle 1 day 1 We will be monitoring closely for toxicities  Treatment response: We will be monitoring the forehead lesion for response.  Hypercalcemia: Previously  received Zometa and IV fluids.  We will repeat calcium levels to be drawn today. Diffuse pains throughout her body: Currently on hydrocodone.  I prescribed a fentanyl patch but she did not fill it yet because she wants to see if she can avoid the patch.  Return to clinic every 3 weeks for Herceptin treatments    No orders of the defined types were placed in this encounter.  The patient has a good understanding of the overall plan. she agrees with it. she will call with any problems that may develop before the next visit here.  Total time spent: 30 mins including face to face time and  time spent for planning, charting and coordination of care  [name], MD 05/29/2020  I, Cloyde Reams Dorshimer, am acting as scribe for Dr. [name].  I have reviewed the above documentation for accuracy and completeness, and I agree with the above.

## 2020-05-28 NOTE — Telephone Encounter (Signed)
Rescheduled cancelled session.  Gaylyn Rong Counseling Intern

## 2020-05-29 ENCOUNTER — Encounter: Payer: Self-pay | Admitting: *Deleted

## 2020-05-29 ENCOUNTER — Inpatient Hospital Stay: Payer: Medicare Other

## 2020-05-29 ENCOUNTER — Other Ambulatory Visit: Payer: Self-pay | Admitting: *Deleted

## 2020-05-29 ENCOUNTER — Other Ambulatory Visit: Payer: Self-pay

## 2020-05-29 ENCOUNTER — Inpatient Hospital Stay (HOSPITAL_BASED_OUTPATIENT_CLINIC_OR_DEPARTMENT_OTHER): Payer: Medicare Other | Admitting: Hematology and Oncology

## 2020-05-29 DIAGNOSIS — C50411 Malignant neoplasm of upper-outer quadrant of right female breast: Secondary | ICD-10-CM

## 2020-05-29 DIAGNOSIS — G9529 Other cord compression: Secondary | ICD-10-CM

## 2020-05-29 DIAGNOSIS — Z923 Personal history of irradiation: Secondary | ICD-10-CM | POA: Diagnosis not present

## 2020-05-29 DIAGNOSIS — C50911 Malignant neoplasm of unspecified site of right female breast: Secondary | ICD-10-CM

## 2020-05-29 DIAGNOSIS — C7951 Secondary malignant neoplasm of bone: Secondary | ICD-10-CM | POA: Diagnosis not present

## 2020-05-29 DIAGNOSIS — C7931 Secondary malignant neoplasm of brain: Secondary | ICD-10-CM | POA: Diagnosis not present

## 2020-05-29 DIAGNOSIS — Z5112 Encounter for antineoplastic immunotherapy: Secondary | ICD-10-CM | POA: Diagnosis not present

## 2020-05-29 DIAGNOSIS — Z170 Estrogen receptor positive status [ER+]: Secondary | ICD-10-CM | POA: Diagnosis not present

## 2020-05-29 LAB — CBC WITH DIFFERENTIAL (CANCER CENTER ONLY)
Abs Immature Granulocytes: 0.02 10*3/uL (ref 0.00–0.07)
Basophils Absolute: 0 10*3/uL (ref 0.0–0.1)
Basophils Relative: 1 %
Eosinophils Absolute: 0 10*3/uL (ref 0.0–0.5)
Eosinophils Relative: 0 %
HCT: 34.9 % — ABNORMAL LOW (ref 36.0–46.0)
Hemoglobin: 11.7 g/dL — ABNORMAL LOW (ref 12.0–15.0)
Immature Granulocytes: 0 %
Lymphocytes Relative: 7 %
Lymphs Abs: 0.3 10*3/uL — ABNORMAL LOW (ref 0.7–4.0)
MCH: 28.6 pg (ref 26.0–34.0)
MCHC: 33.5 g/dL (ref 30.0–36.0)
MCV: 85.3 fL (ref 80.0–100.0)
Monocytes Absolute: 0.6 10*3/uL (ref 0.1–1.0)
Monocytes Relative: 12 %
Neutro Abs: 4 10*3/uL (ref 1.7–7.7)
Neutrophils Relative %: 80 %
Platelet Count: 133 10*3/uL — ABNORMAL LOW (ref 150–400)
RBC: 4.09 MIL/uL (ref 3.87–5.11)
RDW: 13.9 % (ref 11.5–15.5)
WBC Count: 4.9 10*3/uL (ref 4.0–10.5)
nRBC: 0 % (ref 0.0–0.2)

## 2020-05-29 LAB — CMP (CANCER CENTER ONLY)
ALT: 24 U/L (ref 0–44)
AST: 62 U/L — ABNORMAL HIGH (ref 15–41)
Albumin: 3.1 g/dL — ABNORMAL LOW (ref 3.5–5.0)
Alkaline Phosphatase: 192 U/L — ABNORMAL HIGH (ref 38–126)
Anion gap: 12 (ref 5–15)
BUN: 19 mg/dL (ref 8–23)
CO2: 21 mmol/L — ABNORMAL LOW (ref 22–32)
Calcium: 9.8 mg/dL (ref 8.9–10.3)
Chloride: 100 mmol/L (ref 98–111)
Creatinine: 0.78 mg/dL (ref 0.44–1.00)
GFR, Estimated: >60 mL/min (ref >60)
Glucose, Bld: 112 mg/dL — ABNORMAL HIGH (ref 70–99)
Potassium: 3.7 mmol/L (ref 3.5–5.1)
Sodium: 133 mmol/L — ABNORMAL LOW (ref 135–145)
Total Bilirubin: 1.1 mg/dL (ref 0.3–1.2)
Total Protein: 7.3 g/dL (ref 6.5–8.1)

## 2020-05-29 MED ORDER — SODIUM CHLORIDE 0.9% FLUSH
10.0000 mL | INTRAVENOUS | Status: DC | PRN
  Administered 2020-05-29: 10 mL
  Filled 2020-05-29: qty 10

## 2020-05-29 MED ORDER — SODIUM CHLORIDE 0.9 % IV SOLN
Freq: Once | INTRAVENOUS | Status: AC
  Filled 2020-05-29: qty 250

## 2020-05-29 MED ORDER — TRASTUZUMAB-DKST CHEMO 150 MG IV SOLR
8.0000 mg/kg | Freq: Once | INTRAVENOUS | Status: AC
  Administered 2020-05-29: 462 mg via INTRAVENOUS
  Filled 2020-05-29: qty 22

## 2020-05-29 MED ORDER — DIPHENHYDRAMINE HCL 25 MG PO CAPS: 50.0000 mg | ORAL_CAPSULE | Freq: Once | ORAL | Status: DC

## 2020-05-29 MED ORDER — ACETAMINOPHEN 325 MG PO TABS
ORAL_TABLET | ORAL | Status: AC
  Filled 2020-05-29: qty 2

## 2020-05-29 MED ORDER — ACETAMINOPHEN 325 MG PO TABS
650.0000 mg | ORAL_TABLET | Freq: Once | ORAL | Status: AC
  Administered 2020-05-29: 650 mg via ORAL

## 2020-05-29 MED ORDER — HEPARIN SOD (PORK) LOCK FLUSH 100 UNIT/ML IV SOLN
500.0000 [IU] | Freq: Once | INTRAVENOUS | Status: AC | PRN
  Administered 2020-05-29: 500 [IU]
  Filled 2020-05-29: qty 5

## 2020-05-29 NOTE — Patient Instructions (Signed)
[facility] Discharge Instructions for Patients Receiving Chemotherapy  Today you received the following chemotherapy agents: Trastuzumab (Herceptin)  To help prevent nausea and vomiting after your treatment, we encourage you to take your nausea medication  as prescribed.    If you develop nausea and vomiting that is not controlled by your nausea medication, call the clinic.   BELOW ARE SYMPTOMS THAT SHOULD BE REPORTED IMMEDIATELY:  *FEVER GREATER THAN 100.5 F  *CHILLS WITH OR WITHOUT FEVER  NAUSEA AND VOMITING THAT IS NOT CONTROLLED WITH YOUR NAUSEA MEDICATION  *UNUSUAL SHORTNESS OF BREATH  *UNUSUAL BRUISING OR BLEEDING  TENDERNESS IN MOUTH AND THROAT WITH OR WITHOUT PRESENCE OF ULCERS  *URINARY PROBLEMS  *BOWEL PROBLEMS  UNUSUAL RASH Items with * indicate a potential emergency and should be followed up as soon as possible.  Feel free to call the clinic should you have any questions or concerns. The clinic phone number is [phone].  Please show the [facility] at check-in to the Emergency Department and triage nurse.  Trastuzumab injection for infusion What is this medicine? TRASTUZUMAB (tras TOO zoo mab) is a monoclonal antibody. It is used to treat breast cancer and stomach cancer. This medicine may be used for other purposes; ask your health care provider or pharmacist if you have questions. COMMON BRAND NAME(S): Herceptin, Galvin Proffer, Trazimera What should I tell my health care provider before I take this medicine? They need to know if you have any of these conditions:  heart disease  heart failure  lung or breathing disease, like asthma  an unusual or allergic reaction to trastuzumab, benzyl alcohol, or other medications, foods, dyes, or preservatives  pregnant or trying to get pregnant  breast-feeding How should I use this medicine? This drug is given as an infusion into a vein. It is administered in a  hospital or clinic by a specially trained health care professional. Talk to your pediatrician regarding the use of this medicine in children. This medicine is not approved for use in children. Overdosage: If you think you have taken too much of this medicine contact a poison control center or emergency room at once. NOTE: This medicine is only for you. Do not share this medicine with others. What if I miss a dose? It is important not to miss a dose. Call your doctor or health care professional if you are unable to keep an appointment. What may interact with this medicine? This medicine may interact with the following medications:  certain types of chemotherapy, such as daunorubicin, doxorubicin, epirubicin, and idarubicin This list may not describe all possible interactions. Give your health care provider a list of all the medicines, herbs, non-prescription drugs, or dietary supplements you use. Also tell them if you smoke, drink alcohol, or use illegal drugs. Some items may interact with your medicine. What should I watch for while using this medicine? Visit your doctor for checks on your progress. Report any side effects. Continue your course of treatment even though you feel ill unless your doctor tells you to stop. Call your doctor or health care professional for advice if you get a fever, chills or sore throat, or other symptoms of a cold or flu. Do not treat yourself. Try to avoid being around people who are sick. You may experience fever, chills and shaking during your first infusion. These effects are usually mild and can be treated with other medicines. Report any side effects during the infusion to your health care professional.  Fever and chills usually do not happen with later infusions. Do not become pregnant while taking this medicine or for 7 months after stopping it. Women should inform their doctor if they wish to become pregnant or think they might be pregnant. Women of child-bearing  potential will need to have a negative pregnancy test before starting this medicine. There is a potential for serious side effects to an unborn child. Talk to your health care professional or pharmacist for more information. Do not breast-feed an infant while taking this medicine or for 7 months after stopping it. Women must use effective birth control with this medicine. What side effects may I notice from receiving this medicine? Side effects that you should report to your doctor or health care professional as soon as possible:  allergic reactions like skin rash, itching or hives, swelling of the face, lips, or tongue  chest pain or palpitations  cough  dizziness  feeling faint or lightheaded, falls  fever  general ill feeling or flu-like symptoms  signs of worsening heart failure like breathing problems; swelling in your legs and feet  unusually weak or tired Side effects that usually do not require medical attention (report to your doctor or health care professional if they continue or are bothersome):  bone pain  changes in taste  diarrhea  joint pain  nausea/vomiting  weight loss This list may not describe all possible side effects. Call your doctor for medical advice about side effects. You may report side effects to FDA at 1-800-FDA-1088. Where should I keep my medicine? This drug is given in a hospital or clinic and will not be stored at home. NOTE: This sheet is a summary. It may not cover all possible information. If you have questions about this medicine, talk to your doctor, pharmacist, or health care provider.  2021 Elsevier/Gold Standard (2016-04-19 14:37:52)

## 2020-05-29 NOTE — Progress Notes (Signed)
Per MD okay to treat today with Echo from 12/30/19.

## 2020-06-01 ENCOUNTER — Telehealth: Payer: Self-pay | Admitting: *Deleted

## 2020-06-01 NOTE — Telephone Encounter (Signed)
Called pt to see how she did with her Trastuzumab.  She is also on new oral treatment.  She thinks that she is tolerating OK.  She does have some issues with appetite but really trying to eat.  She feels some better today & has been up & about some.  She denies diarrhea. She reports knowing how to reach [place] if needed. She expressed appreciation for call.

## 2020-06-01 NOTE — Telephone Encounter (Signed)
-----   Message from Jolaine Click, RN sent at 05/29/2020  3:21 PM EST ----- Regarding: First Time Trastuzumab - [name] Patient First Time Trastuzumab - [name] Patient

## 2020-06-03 NOTE — Progress Notes (Signed)
[place] Patient and [facility] Counseling Note  Session covered a lot of topics related to patient's current condition with cancer. Topics including managing pain and increasing independence, which lessened anxiety. Patient and counselor agreed to try more coping methods and patient explained how nobody "really" understands what it's like unless they have had cancer. Patient shared that the "unknown can be terrifying" and counselor helped patient identify ways to keep it from being so. Counselor introduced ACT and patient agreed that it fit well within discussion. Patient and counselor collaborated to identify values and brainstormed on how to live in accordance with them. Counselor used active listening and reflections throughout session. Patient was oriented times three. Patient was hopeful and had a congruent affect. There were no signs of HI/SI/NSSI. The increased independence and decreased pain since last counseling session resulted in the patient feeling more hopeful. Overall, fear of the unknown can still impact her. Thinking about what could happen results in fear and anxiety, which results in physical manifestations of anxiety, such as difficulty breathing. Next session will introduce more coping mechanisms and set more concrete goals.    Gaylyn Rong Counseling Intern

## 2020-06-05 DIAGNOSIS — I10 Essential (primary) hypertension: Secondary | ICD-10-CM | POA: Diagnosis not present

## 2020-06-08 DIAGNOSIS — I10 Essential (primary) hypertension: Secondary | ICD-10-CM | POA: Diagnosis not present

## 2020-06-09 DIAGNOSIS — H25013 Cortical age-related cataract, bilateral: Secondary | ICD-10-CM | POA: Diagnosis not present

## 2020-06-09 DIAGNOSIS — H18413 Arcus senilis, bilateral: Secondary | ICD-10-CM | POA: Diagnosis not present

## 2020-06-09 DIAGNOSIS — H35372 Puckering of macula, left eye: Secondary | ICD-10-CM | POA: Diagnosis not present

## 2020-06-09 DIAGNOSIS — H04123 Dry eye syndrome of bilateral lacrimal glands: Secondary | ICD-10-CM | POA: Diagnosis not present

## 2020-06-09 DIAGNOSIS — H2513 Age-related nuclear cataract, bilateral: Secondary | ICD-10-CM | POA: Diagnosis not present

## 2020-06-10 NOTE — Progress Notes (Signed)
[place] Patient and [facility] Counseling Note  Patient shared with counselor updates from the last week, both positive and negative ones. Counselor and pt identified a mix of emotions were present. Counselor introduced the idea of goal setting and pt struggled to identify clear goals. Counselor and pt explored the thoughts and feelings associated with the patient's frequent health changes and her attachment to being home. Patient described the "dread" of being unable to care for herself and her dislike of lack of control. Counselor offered reflective listening, a safe space to explore feelings,  insight, and validation. Patient was oriented times three, had a mixed mood of fear, worry, pride and confusion. Her affect was congruent and she showed no SI/HI/NSSI signs. Patient is experiencing a lot of uncertainty and thinking a lot about her future. Thinking "Am I dying?," "What is going to happen to me?" and "Can I plan for the future" has resulted in worry, stress, anxiety, uncertainty and fear. These feelings result in the patient ruminating on the future. Next session will focus on mindfulness, identifying what is important to the patient, and potentially use CBT interventions.   Gaylyn Rong Counseling Intern

## 2020-06-13 NOTE — Progress Notes (Signed)
Cardio-Oncology Clinic Note   Date:  06/13/2020   ID:  Heather [name], Heather [name] Mar 03, 1946, MRN [id number]  Location: Home  Provider location: [facility] Type of Visit: Established patient  PCP:  [name], MD  Cardiologist:  No primary care provider on file. Primary HF: Noya Santarelli Oncology: Dr [name]  Chief Complaint: Breast Cancer   History of Present Illness:  Heather [name] is a 75 year old female with metastatic invasive ductal carcinoma in R breast that is ER positive and HER-2/neu positive,  S/P R mastectomy and lymph node dissection 05/10/12.    She has been treated for HTN in the past but stopped taking the medications due to elevated renal function. Intolerant Ace-I due to cough.    She received a total of 6 cycles of Taxotere/carboplatin starting on 06/14/2012. XRT finished in August 2014. She finished initial Herceptin 2015 but unfortunately then developed pathological vertebral fracture with cord compression and paralysis. Now back on Herceptin since 12/16.  Found to have metastatic lesion to left femur. Underwent XRT.   Subsequently found to have a met to her 8th rib. Perjeta added to Herceptin starting in March 2018.    PET scan 03/2018 with progression of mets. Palbociclib +Faslodex +trastuzumab+ Pertuzumabstarted 08/10/2017, stopped 04/04/2018. Enhertu (Fam-trastuzumab-Durextecan) started 06/01/18 stopped due to progression.   Repeat scan 05/19/20:  1. Interval development of multiple new lytic bone lesions within the axial and proximal appendicular skeleton.  05/2020 Enhertu stopped-->Started Xeloda, Herceptin with Tucatinib   She is here for follow-up. Treatment switched in January due to progression-->Xeloda, Herceptin with Tucatinib. Having a hard time eating.Appetite has gone down. Occasionally short of breath. No edema orthopena or PND, Weight has been dropping. Now using a cane outside of her home. Uses a walker at home. No  fever or chills. Pain much better controlled after starting new chemo.   Echo 12/30/19 60-65%  Echo 1/21 EF 60-65% Echo 12/19 EF 55-60% GLS - 16.8%  Echo 6/19 EF 55-60%  GLS -23.5%  Echo 06/15/2020 EF 60-65% GLS 23.5%    Past Medical History:  Diagnosis Date  . Anemia    hx of  . Anxiety    r/t updated surgery  . Breast cancer ([place])    right  . Cataract    immature-not sure of which eye  . Complication of anesthesia    pt states she is sensitive to meds  . Dizziness    has been going on for 34month;medical MD aware  . Genetic testing 07/11/2016   Test Results: Normal; No pathogenic mutations detected  Genes Analyzed: 43 genes on [employer] Common Cancers panel (APC, ATM, AXIN2, BARD1, BMPR1A, BRCA1, BRCA2, BRIP1, CDH1, CDKN2A, CHEK2, DICER1, EPCAM, GREM1, HOXB13, KIT, MEN1, MLH1, MSH2, MSH6, MUTYH, NBN, NF1, PALB2, PDGFRA, PMS2, POLD1, POLE, PTEN, RAD50, RAD51C, RAD51D, SDHA, SDHB, SDHC, SDHD, SMAD4, SMARCA4, STK11, TP53, TSC1, TSC2, VHL).  .Heather KitchenGERD (gastroesophageal reflux disease)    Tums prn  . H. pylori infection   . H/O hiatal hernia   . Hemorrhoids   . History of UTI   . Hx of radiation therapy 10/25/12- 12/11/12   right chest wall/regional lymph nodes 5040 cGy, 28 sessions, right chest wall boost 1000 cGy 1 session  . Hx: UTI (urinary tract infection)   . Hyperlipidemia    but not on meds;diet and exercise controlled  . Hypertension    recently started Aldactone   . Insomnia    takes Melatoniin daily  . Metastasis to  spinal column (HCC)    T5, Left  Femur cancer- radiation   . Neuropathy    "from back surgery"  . PONV (postoperative nausea and vomiting)    pt experienced hair loss, confusion and combative- 02/15/15  . Right knee pain   . Right shoulder pain   . Skin cancer    squamous, Nodule to back - "same cancer as breast."  . Urinary frequency    d/t taking Aldactone   Past Surgical History:  Procedure Laterality Date  . COLONOSCOPY    . cyst removed from left  breast  1970  . DECOMPRESSIVE LUMBAR LAMINECTOMY LEVEL 4 N/A 02/15/2015   Procedure: DECOMPRESSION T5 AND T3-T7 STABALIZATION;  Surgeon: Consuella Lose, MD;  Location: [facility];  Service: Neurosurgery;  Laterality: N/A;  . ESOPHAGOGASTRODUODENOSCOPY    . LAMINECTOMY N/A 03/27/2015   Procedure: Thoracic Four-Thoracic Six Laminectomy for tumor;  Surgeon: Consuella Lose, MD;  Location: [place] [facility];  Service: Neurosurgery;  Laterality: N/A;  T4-T6 Laminectomy  . left wrist surgery   2004   with plate  . MASTECTOMY MODIFIED RADICAL  05/10/2012   Procedure: MASTECTOMY MODIFIED RADICAL;  Surgeon: Merrie Roof, MD;  Location: [facility];  Service: General;  Laterality: Right;  RIGHT MODIFIED RADICAL MASTECTOMY  . MASTECTOMY, RADICAL Right   . MINOR BREAST BIOPSY Left 05/16/2016   Procedure: EXCISION OF BACK NODULE;  Surgeon: Autumn Messing III, MD;  Location: [facility];  Service: General;  Laterality: Left;  . Nodule  back  05/17/2015  . PORTACATH PLACEMENT  05/10/2012   Procedure: INSERTION PORT-A-CATH;  Surgeon: Merrie Roof, MD;  Location[facility];  Service: General;  Laterality: Left;     Current Outpatient Medications  Medication Sig Dispense Refill  . acetaminophen (TYLENOL) 500 MG tablet Take 500 mg by mouth every 6 (six) hours as needed.    . ALPRAZolam (XANAX) 0.25 MG tablet Take 1 tablet by mouth as needed.    Heather [name] anastrozole (ARIMIDEX) 1 MG tablet Take 1 tablet (1 mg total) by mouth daily. 90 tablet 3  . BOOSTRIX 5-2.5-18.5 LF-MCG/0.5 injection     . capecitabine (XELODA) 500 MG tablet Take 2 tablets (1,000 mg total) by mouth 2 (two) times daily after a meal. Take for 14 days on, 7 days off, repeat every 21 days. 56 tablet 6  . Cholecalciferol (VITAMIN D) 2000 UNITS tablet Take 2,000 Units by mouth daily.    . COLLAGEN PO Take by mouth.    . Cranberry 250 MG CAPS Take by mouth.    . cycloSPORINE (RESTASIS) 0.05 % ophthalmic emulsion 1 drop 2 (two) times daily. (Patient not  taking: Reported on 01/07/2020)    . fentaNYL (DURAGESIC) 25 MCG/HR Place 1 patch onto the skin every 3 (three) days. 10 patch 0  . fluticasone (FLONASE) 50 MCG/ACT nasal spray  (Patient not taking: Reported on 04/15/2020)    . gabapentin (NEURONTIN) 600 MG tablet Take 1 tablet (600 mg total) by mouth as directed. Take 1 tablet in the AM and afternoon and 1 1/2 tabs at bedtime 315 tablet 2  . HYDROcodone-acetaminophen (NORCO/VICODIN) 5-325 MG tablet Take 1 tablet by mouth every 6 (six) hours as needed for moderate pain. (Patient not taking: Reported on 01/07/2020) 30 tablet 0  . lidocaine-prilocaine (EMLA) cream Apply to affected area once 30 g 3  . ondansetron (ZOFRAN) 8 MG tablet Take 1 tablet (8 mg total) by mouth every 8 (eight) hours as needed for  nausea or vomiting. 20 tablet 0  . OVER THE COUNTER MEDICATION Place 1 drop into both eyes 2 (two) times daily as needed (dry eyes). Over the counter lubricating eye drops    . polyethylene glycol (MIRALAX / GLYCOLAX) packet Take 17 g by mouth daily as needed.    . Probiotic Product (PROBIOTIC DAILY PO) Take 1 capsule by mouth daily.  (Patient not taking: Reported on 01/07/2020)    . prochlorperazine (COMPAZINE) 10 MG tablet Take 1 tablet (10 mg total) by mouth every 6 (six) hours as needed for nausea or vomiting. 60 tablet 3  . spironolactone (ALDACTONE) 25 MG tablet Take 25 mg by mouth daily.    . tucatinib (TUKYSA) 150 MG tablet Take 2 tablets (300 mg total) by mouth in the morning and at bedtime. Take every 12 hrs at the same time each day with or without a meal. 120 tablet 6   No current facility-administered medications for this encounter.   Facility-Administered Medications Ordered in Other Encounters  Medication Dose Route Frequency Provider Last Rate Last Admin  . cyanocobalamin ((VITAMIN B-12)) injection 1,000 mcg  1,000 mcg Intramuscular Q6 weeks [name], MD   1,000 mcg at 03/19/20 1110  . heparin lock flush 100 unit/mL  500 Units  Intracatheter Once [name], MD      . sodium chloride flush (NS) 0.9 % injection 10 mL  10 mL Intracatheter Once [name], MD        Allergies:   Other, Acyclovir and related, Chlorhexidine, Monascus purpureus went yeast, Pravastatin sodium, Zanaflex [tizanidine hcl], Codeine, Keflex [cephalexin], Naprosyn [naproxen], Oxycodone, Tramadol, and Tussin [guaifenesin]   Social History:  The patient  reports that she has never smoked. She has never used smokeless tobacco. She reports that she does not drink alcohol and does not use drugs.   Family History:  The patient's family history includes Diabetes Mellitus II in her father; Leukemia (age of onset: 29) in her mother; Prostate cancer (age of onset: 17) in her brother; Prostate cancer (age of onset: 16) in her brother; Stomach cancer (age of onset: 44) in her maternal grandmother; Stroke in her father.   ROS:  Please see the history of present illness.   All other systems are personally reviewed and negative.   Exam:  General:  Well appearing. No resp difficulty HEENT: normal Neck: supple. no JVD. Carotids 2+ bilat; no bruits. No lymphadenopathy or thryomegaly appreciated. Cor: PMI nondisplaced. Regular rate & rhythm. No rubs, gallops or murmurs. Lungs: clear Abdomen: soft, nontender, nondistended. No hepatosplenomegaly. No bruits or masses. Good bowel sounds. Extremities: no cyanosis, clubbing, rash, edema Neuro: alert & orientedx3, cranial nerves grossly intact. moves all 4 extremities w/o difficulty. Affect pleasant  EKG: SR 74 bpm   Recent Labs: 05/29/2020: ALT 24; BUN 19; Creatinine 0.78; Hemoglobin 11.7; Platelet Count 133; Potassium 3.7; Sodium 133  Personally reviewed   Wt Readings from Last 3 Encounters:  05/29/20 55.6 kg (122 lb 9.6 oz)  05/21/20 57.1 kg (125 lb 12.8 oz)  03/27/20 59.1 kg (130 lb 6.4 oz)      ASSESSMENT AND PLAN:  1. R breast CA with metastatic disease  - initial Herceptin completed in 2015. -  Palliative Herceptin start 12/16. Added Perjeta in 3/18 - Started Andria Frames early 2019  - PET scan 03/2018 with progression of mets.Enhertu stopped due to progression.  - Repeat scans 1/22 with progression of disease.  Now switched to xeloda+ herceptin with tucatinib.  - Echo 12/30/19 60-65%  -  ECHO today  06/15/2020 EF 60-65% stable. Personally reviewed - I reviewed echos personally. EF and Doppler parameters stable. No HF on exam. Continue Herceptin. She has tolerated Herceptin well in the past. Will repeat echo in 3 months then can switch back to q6 month surveillance.   2. HTN:  - BP stable  Follow up in 3 months.    Signed, Glori Bickers, MD  06/13/2020 11:47 PM  [facility] [address] [facility] and [place] [address] [phone] (office) [phone] (fax)

## 2020-06-15 ENCOUNTER — Other Ambulatory Visit: Payer: Self-pay

## 2020-06-15 ENCOUNTER — Ambulatory Visit (HOSPITAL_COMMUNITY)
Admission: RE | Admit: 2020-06-15 | Discharge: 2020-06-15 | Disposition: A | Discharge status: Home / Self Care | Payer: Medicare Other | Source: Ambulatory Visit | Attending: Internal Medicine | Admitting: Internal Medicine

## 2020-06-15 ENCOUNTER — Encounter (HOSPITAL_COMMUNITY): Payer: Self-pay | Admitting: Internal Medicine

## 2020-06-15 ENCOUNTER — Ambulatory Visit (HOSPITAL_BASED_OUTPATIENT_CLINIC_OR_DEPARTMENT_OTHER)
Admission: RE | Admit: 2020-06-15 | Discharge: 2020-06-15 | Disposition: A | Discharge status: Home / Self Care | Payer: Medicare Other | Source: Ambulatory Visit | Attending: Internal Medicine | Admitting: Internal Medicine

## 2020-06-15 VITALS — BP 122/70 | HR 77 | Wt 122.4 lb

## 2020-06-15 DIAGNOSIS — Z9011 Acquired absence of right breast and nipple: Secondary | ICD-10-CM | POA: Insufficient documentation

## 2020-06-15 DIAGNOSIS — Z170 Estrogen receptor positive status [ER+]: Secondary | ICD-10-CM | POA: Insufficient documentation

## 2020-06-15 DIAGNOSIS — C50911 Malignant neoplasm of unspecified site of right female breast: Secondary | ICD-10-CM | POA: Diagnosis not present

## 2020-06-15 DIAGNOSIS — Z0189 Encounter for other specified special examinations: Secondary | ICD-10-CM | POA: Diagnosis not present

## 2020-06-15 DIAGNOSIS — I700 Atherosclerosis of aorta: Secondary | ICD-10-CM | POA: Diagnosis not present

## 2020-06-15 DIAGNOSIS — C7951 Secondary malignant neoplasm of bone: Secondary | ICD-10-CM | POA: Diagnosis not present

## 2020-06-15 DIAGNOSIS — I10 Essential (primary) hypertension: Secondary | ICD-10-CM | POA: Diagnosis not present

## 2020-06-15 DIAGNOSIS — I509 Heart failure, unspecified: Secondary | ICD-10-CM | POA: Diagnosis not present

## 2020-06-15 LAB — ECHOCARDIOGRAM COMPLETE
Area-P 1/2: 3.17 cm2
S' Lateral: 2.3 cm

## 2020-06-15 MED FILL — CAPECITABINE 500 MG TABLET: 500 | 21 days supply | Qty: 56 | Fill #1

## 2020-06-15 NOTE — Patient Instructions (Signed)
Your physician has requested that you have an echocardiogram. Echocardiography is a painless test that uses sound waves to create images of your heart. It provides your doctor with information about the size and shape of your heart and how well your heart's chambers and valves are working. This procedure takes approximately one hour. There are no restrictions for this procedure.  Your physician recommends that you schedule a follow-up appointment in: 3 months with echocardiogram  If you have any questions or concerns before your next appointment please send [place] a message through [place] or call our office at [phone].    TO LEAVE A MESSAGE FOR THE NURSE SELECT OPTION 2, PLEASE LEAVE A MESSAGE INCLUDING: . YOUR NAME . DATE OF BIRTH . CALL BACK NUMBER . REASON FOR CALL**this is important as we prioritize the call backs  [place] AS LONG AS YOU CALL BEFORE 4:00 PM  At the [facility], you and your health needs are our priority. As part of our continuing mission to provide you with exceptional heart care, we have created designated Provider Care Teams. These Care Teams include your primary Cardiologist (physician) and Advanced Practice Providers (APPs- Physician Assistants and Nurse Practitioners) who all work together to provide you with the care you need, when you need it.   You may see any of the following providers on your designated Care Team at your next follow up: Marland Kitchen Dr Glori Bickers . Dr Loralie Champagne . Darrick Grinder, NP . Lyda Jester, [place] . Audry Riles, PharmD   Please be sure to bring in all your medications bottles to every appointment.

## 2020-06-16 ENCOUNTER — Telehealth: Payer: Self-pay

## 2020-06-16 NOTE — Telephone Encounter (Signed)
Called to remind of session tomorrow.    Gaylyn Rong Counseling Intern

## 2020-06-17 NOTE — Progress Notes (Signed)
[place] Patient and [facility] Counseling Note  Patient and counselor discussed what the plan had been to discuss for the week. Patient identified that what was important to her was to enjoy her life and live more in the moment. Counselor introduced mindfulness and provided psychoeducation. Patient was agreeable to the idea, but less sure of how to do it. Patient said she was "guilty" of letting time slip by and counselor asked her to elaborate. Counselor identified 'should' thoughts and explored cognitive distortions with patient.Counselor provided reflective listening and validation of feelings throughout session. Patient was oriented times three in session. Her mood was mostly happy with an appropriate to context affect. There was no SI/HI/NSSI present. Patient is experiencing a lot of uncertainty and thinking a lot about her future. Thinking "Am I dying?," "What is going to happen to me?" and "Can I plan for the future" has resulted in worry, stress, anxiety, uncertainty and fear. These feelings result in the patient ruminating on the future. Patient experiences a lot of cognitive distortions in the form of "should" language, such as "I should be faster at this" which results in guilt and shame for the patient. These feelings could result in the patient pushing herself too far or compromising her values. Next session will continue mindfulness, discuss cognitive distortions, and potentially introduce cognitive restructuring.   Gaylyn Rong Counseling Intern

## 2020-06-18 NOTE — Progress Notes (Signed)
Patient Care Team: [name], MD as PCP - General (Family Medicine)  DIAGNOSIS:    ICD-10-CM   1. Primary cancer of upper outer quadrant of right female breast ([place])  C50.411 CBC with Differential ([place])    CMP ([place] only)    SUMMARY OF ONCOLOGIC HISTORY: Oncology History  Primary cancer of upper outer quadrant of right female breast ([place])  03/08/2012 Initial Diagnosis   Right breast invasive ductal carcinoma ER positive PR negative HER-2 positive Ki-67 44%; another breast mass biopsied in the anterior part of the breast which was also positive for malignancy that was HER-2 negative   03/14/2012 Cancer Staging   Staging form: Breast, AJCC 7th Edition - Pathologic: Stage IV (M1) - Signed by [name], NP on 06/22/2018   05/10/2012 Surgery   Right mastectomy and axillary lymph node dissection: Multifocal disease 5 cm, 1.7 cm, 1.6 cm, ER positive PR negative HER-2 positive Ki-67 44%, 3/7 lymph nodes positive   06/14/2012 - 06/12/2013 Chemotherapy   Adjuvant chemotherapy with [place] 6 followed by Herceptin maintenance   10/25/2012 - 12/11/2012 Radiation Therapy   Adjuvant radiation therapy   02/07/2013 -  Anti-estrogen oral therapy   Letrozole 2.5 mg daily   02/14/2015 Imaging   MRI spine: Large destructive T5 lesion with severe pathologic compression fracture and extensive epidural tumor, severe spinal stenosis with moderate cord compression, tumor involvement of T4   03/18/2015 PET scan   Residual enhancing soft tissue adjacent to the spinal cord. No evidence of metastatic disease. Nonspecific uptake the left nipple   03/27/2015 - 03/30/2015 Hospital Admission   T4-6 decompression for spinal cord compression (lower extremity paralysis)   04/10/2015 -  Chemotherapy   Palliative treatment with Herceptin every 3 weeks with letrozole 2.5 mg daily   04/13/2015 - 05/19/2015 Radiation Therapy   Palliative radiation treatment to the spine   09/01/2015  Imaging   CT chest abdomen pelvis: Pathologic fracture with posterior fusion at T5 no other evidence of metastatic disease in the chest abdomen pelvis   12/23/2015 Imaging   CT CAP: new nonspecific 0.6 cm lymph node was stated mediastinum needs follow-up CT, innumerable tiny groundglass pulmonary nodules throughout both lungs unchanged, patchy consolidation from radiation, T5 fracture, no mets   04/15/2016 PET scan   Rt para-spinal mass mildy hypermetabolic SUV 5.9, lytic cortical lesion 4.8 cm lesion left prox femur suv 8.8 favor osseus met disease   04/28/2016 - 05/12/2016 Radiation Therapy   Palliative XRT Left femur   05/24/2016 Procedure   Soft tissue mass biopsy back: Metastatic breast cancer ER 80%, PR 0%, Ki-67 50%, HER-2 positive ratio 2.25   06/09/2016 - 03/29/2017 Anti-estrogen oral therapy   Faslodex with Herceptin and Perjeta every 4 weeks   06/13/2016 - 06/15/2016 Hospital Admission   uncomplicated revision of previous thoracic instrumentation.   10/10/2016 PET scan   Interval development of new hypermetabolic 2.5 x 1.1 cm lesion posterior right eighth rib consistent with metastatic disease, stable right paraspinal lesion at T8, clustered soft tissue nodules in the posterior midline back near cervicothoracic junction smaller than before    11/03/2016 - 11/07/2016 Radiation Therapy   Palliative radiation to right eighth rib   11/04/2016 Imaging   Solitary contrast-enhancing lesion in the right cerebellum 6 x 2 mm concerning for metastatic lesion   11/24/2016 - 11/24/2016 Radiation Therapy   SRS to the brain lesion   04/18/2017 PET scan   New ill-defined rounded airspace opacity in posterior right  lower lobe. Differential diagnosis includes malignancy and infectious/inflammatory process.Increased hypermetabolic lytic lesion in T8 vertebral body, consistent with bone metastasis.  Increased hypermetabolic posterior upper chest wall subcutaneous nodules, highly suspicious metastatic  disease. Further improvement in right posterior eighth rib metastasis.   05/04/2017 - 07/27/2017 Chemotherapy   Kadcyla every 3 weeks palliative chemotherapy stopped for progression   07/26/2017 Imaging   Interval increase in size of the multiple enhancing masses posteriorly overlying the spine, interval development of left axillary and mediastinal adenopathy, increase in the size of lytic lesions in T8-T9 and left ninth rib, increased groundglass opacities right lower lobe lung   07/27/2017 - 05/11/2018 Chemotherapy   Palbociclib + Faslodex + trastuzumab + Pertuzumab     04/03/2018 PET scan   PET CT scan showed progression of disease not only in the bones but also in the subcutaneous nodules.   05/15/2018 - 05/25/2018 Radiation Therapy   Palliative radiation to the back   06/01/2018 -  Chemotherapy   Enhertu (Transtuzumab Deruxtecan)    08/07/2018 - 08/07/2018 Radiation Therapy   T12 vertebral body SRS   Carcinoma of right breast, stage 4 (H[facility])  02/14/2015 Initial Diagnosis   Breast cancer metastasized to bone, right ([place])   06/01/2018 - 02/07/2020 Chemotherapy   The patient had palonosetron (ALOXI) injection 0.25 mg, 0.25 mg, Intravenous,  Once, 24 of 30 cycles Administration: 0.25 mg (06/01/2018), 0.25 mg (06/22/2018), 0.25 mg (08/24/2018), 0.25 mg (07/13/2018), 0.25 mg (08/03/2018), 0.25 mg (09/14/2018), 0.25 mg (10/05/2018), 0.25 mg (11/16/2018), 0.25 mg (10/26/2018), 0.25 mg (12/07/2018), 0.25 mg (12/28/2018), 0.25 mg (01/18/2019), 0.25 mg (03/15/2019), 0.25 mg (02/15/2019), 0.25 mg (04/12/2019), 0.25 mg (05/09/2019), 0.25 mg (06/07/2019), 0.25 mg (07/05/2019), 0.25 mg (08/09/2019), 0.25 mg (09/13/2019), 0.25 mg (10/18/2019), 0.25 mg (11/22/2019), 0.25 mg (12/27/2019) fam-trastuzumab deruxtecan-nxki (ENHERTU) 332 mg in dextrose 5 % 100 mL chemo infusion, 5.4 mg/kg = 332 mg, Intravenous,  Once, 24 of 30 cycles Dose modification: 4.4 mg/kg (original dose 5.4 mg/kg, Cycle 7, Reason: Provider Judgment), 3.2 mg/kg  (original dose 5.4 mg/kg, Cycle 9, Reason: Dose not tolerated), 150 mg (original dose 5.4 mg/kg, Cycle 18, Reason: Dose not tolerated) Administration: 332 mg (06/01/2018), 332 mg (06/22/2018), 332 mg (07/13/2018), 332 mg (08/24/2018), 332 mg (08/03/2018), 332 mg (09/14/2018), 272 mg (10/05/2018), 200 mg (11/16/2018), 272 mg (10/26/2018), 200 mg (12/07/2018), 200 mg (12/28/2018), 200 mg (01/18/2019), 200 mg (03/15/2019), 200 mg (02/15/2019), 200 mg (04/12/2019), 200 mg (05/09/2019), 200 mg (06/07/2019), 150 mg (07/05/2019), 150 mg (08/09/2019), 150 mg (09/13/2019), 150 mg (10/18/2019), 150 mg (11/22/2019), 150 mg (12/27/2019)  for chemotherapy treatment.    05/29/2020 -  Chemotherapy      Patient is on Antibody Plan: BREAST TRASTUZUMAB Q21D    Brain metastasis ([place])  11/18/2016 Initial Diagnosis   Brain metastasis ([place])   05/29/2020 -  Chemotherapy      Patient is on Antibody Plan: BREAST TRASTUZUMAB Q21D      CHIEF COMPLIANT: Follow-up of metastatic breast cancer  INTERVAL HISTORY: Heather [name] is a 75 y.o. with above-mentioned history of metastatic breast cancercurrently treatment with Xeloda and Tukysa and Herceptin. Echo on 06/15/20 showed an ejection fraction of 60-65%. She presentsto the clinic todayfor a toxicity check and treatment. Treatment causes dec appetite. Pain much improved. Scalp lesion getting smaller.Shes able to walk up and down stairs without assistance but uses a wheel chair for long distances.  ALLERGIES:  is allergic to other, acyclovir and related, chlorhexidine, monascus purpureus went yeast, pravastatin sodium, zanaflex [tizanidine hcl], codeine, keflex [  cephalexin], naprosyn [naproxen], oxycodone, tramadol, and tussin [guaifenesin].  MEDICATIONS:  Current Outpatient Medications  Medication Sig Dispense Refill  . acetaminophen (TYLENOL) 500 MG tablet Take 500 mg by mouth every 6 (six) hours as needed.    . ALPRAZolam (XANAX) 0.25 MG tablet Take 1 tablet by mouth as needed.    Marland Kitchen  BOOSTRIX 5-2.5-18.5 LF-MCG/0.5 injection     . capecitabine (XELODA) 500 MG tablet Take 2 tablets (1,000 mg total) by mouth 2 (two) times daily after a meal. Take for 14 days on, 7 days off, repeat every 21 days. 56 tablet 6  . Cholecalciferol (VITAMIN D) 2000 UNITS tablet Take 2,000 Units by mouth daily.    . COLLAGEN PO Take by mouth.    . Cranberry 250 MG CAPS Take by mouth.    . famotidine (PEPCID) 40 MG tablet Take 40 mg by mouth daily.    Marland Kitchen gabapentin (NEURONTIN) 600 MG tablet Take 1 tablet (600 mg total) by mouth as directed. Take 1 tablet in the AM and afternoon and 1 1/2 tabs at bedtime 315 tablet 2  . HYDROcodone-acetaminophen (NORCO/VICODIN) 5-325 MG tablet Take 1 tablet by mouth every 6 (six) hours as needed for moderate pain. 30 tablet 0  . lidocaine-prilocaine (EMLA) cream Apply to affected area once 30 g 3  . ondansetron (ZOFRAN) 8 MG tablet Take 1 tablet (8 mg total) by mouth every 8 (eight) hours as needed for nausea or vomiting. 20 tablet 0  . OVER THE COUNTER MEDICATION Place 1 drop into both eyes 2 (two) times daily as needed (dry eyes). Over the counter lubricating eye drops    . polyethylene glycol (MIRALAX / GLYCOLAX) packet Take 17 g by mouth daily as needed.    . prochlorperazine (COMPAZINE) 10 MG tablet Take 1 tablet (10 mg total) by mouth every 6 (six) hours as needed for nausea or vomiting. 60 tablet 3  . spironolactone (ALDACTONE) 25 MG tablet Take 25 mg by mouth daily.    . tucatinib (TUKYSA) 150 MG tablet Take 2 tablets (300 mg total) by mouth in the morning and at bedtime. Take every 12 hrs at the same time each day with or without a meal. 120 tablet 6   No current facility-administered medications for this visit.   Facility-Administered Medications Ordered in Other Visits  Medication Dose Route Frequency Provider Last Rate Last Admin  . cyanocobalamin ((VITAMIN B-12)) injection 1,000 mcg  1,000 mcg Intramuscular Q6 weeks [name], MD   1,000 mcg at 03/19/20  1110  . heparin lock flush 100 unit/mL  500 Units Intracatheter Once [name], MD      . sodium chloride flush (NS) 0.9 % injection 10 mL  10 mL Intracatheter Once [name], MD        PHYSICAL EXAMINATION: ECOG PERFORMANCE STATUS: 2 - Symptomatic, <50% confined to bed  Vitals:   06/19/20 0820  BP: 131/61  Pulse: 75  Resp: 18  Temp: (!) 97.5 F (36.4 C)  SpO2: 99%   There were no vitals filed for this visit.  LABORATORY DATA:  I have reviewed the data as listed CMP Latest Ref Rng & Units 05/29/2020 05/19/2020 03/19/2020  Glucose 70 - 99 mg/dL 112(H) 101(H) 75  BUN 8 - 23 mg/dL $Remove'19 18 15  'htqRTiX$ Creatinine 0.44 - 1.00 mg/dL 0.78 1.16(H) 0.93  Sodium 135 - 145 mmol/L 133(L) 136 139  Potassium 3.5 - 5.1 mmol/L 3.7 3.5 4.0  Chloride 98 - 111 mmol/L 100 98 99  CO2  22 - 32 mmol/L 21(L) 29 29  Calcium 8.9 - 10.3 mg/dL 9.8 13.8(HH) 10.7(H)  Total Protein 6.5 - 8.1 g/dL 7.3 8.1 8.2(H)  Total Bilirubin 0.3 - 1.2 mg/dL 1.1 1.4(H) 1.5(H)  Alkaline Phos 38 - 126 U/L 192(H) 171(H) 168(H)  AST 15 - 41 U/L 62(H) 43(H) 35  ALT 0 - 44 U/L $Remo'24 18 20    'NmPGI$ Lab Results  Component Value Date   WBC 4.9 05/29/2020   HGB 11.7 (L) 05/29/2020   HCT 34.9 (L) 05/29/2020   MCV 85.3 05/29/2020   PLT 133 (L) 05/29/2020   NEUTROABS 4.0 05/29/2020    ASSESSMENT & PLAN:  Primary cancer of upper outer quadrant of right female breast ([place]) Right breast invasive ductal carcinoma ER positive PR negative HER-2 positive Ki-67 44% multifocal disease 3/7 lymph nodes positive T2 N1 M0 stage IIB status post adjuvant chemotherapy with TCH followed by Herceptin maintenance, adjuvant radiation therapy and Letrozole. MRI 02/14/2015: T5 large destructive lesion with pathologic compression fracture and extensive epidural tumor, involvement of T4, excision metastatic carcinoma ER 60%, PR 0%, HER-2 positive ratio 2.71 average copy #7.45 03/27/2015: T4-6 decompression surgery for spinal cord compression PET-CT 12/10/17Rt  para-spinal mass mildy hypermetabolic SUV 5.9, lytic cortical lesion 4.8 cm lesion left prox femur suv 8.8 favor osseus met disease Echo 04/12/2017 shows well preserved ejection fraction of 55-60%  Treatment summary: 1. Resumed anastrozole 05/21/2015, but it has caused significant hair loss so we switched her to exemestane 25 mg once daily 07/23/2015 2. Herceptin every 3 week started 04/10/2015 3. Bone metastases: Zometa every 3 months 4. Radiation therapy to the spine (Dr. [name]) 04/13/2015 to 05/19/2015 5. Radiation therapy to left femur 04/28/2016 to 05/12/2016  6. SRS to the brain lesion 11/24/2016 7.Kadcyla started 05/05/2017 discontinued 07/27/2017 8.Palbociclib +Faslodex +trastuzumab+ Pertuzumabstarted 08/10/2017, stopped 04/04/2018 9.XRT to T123/04/2019 10. Enhertu started 06/01/18-12/27/2019 stopped for progression 11SRS to brain January 2021 12.Lapatinib with anastrozole -------------------------------------------------------------------------------------------------------------------------- 05/19/2020: Bone scan: Progressive bone metastatic disease, bilateral ribs, thoracic and lumbar spine, pelvis, sternum, right humeral diaphysis, bilateral femoral, new lesions are seen calvaria, ribs, right humerus, L3 vertebral body and distal left femur  05/19/2020: CT CAP: New lytic bone lesion, similar lesions T12 and L5, mild increase in the prevascular lymph nodes  Current Treatment: Xeloda with Tucatinib Xeloda Toxicities: Dec appetite, No hand foot syndrome, noticed some papular sores on hands Tucatinib Toxicities:denies diarrhea  B12 def: on q 3 week B 12 inj We discussed the goals of care being palliation and prolongation of life.  We hope that her pain symptoms would ease by taking this treatment.  Hypercalcemia: Patient received Zometa  Pain issues: Better. Never took fentanyl.  Return to clinic in 3 weeks to assess tolerability to Xeloda and Tucatinib.   Orders  Placed This Encounter  Procedures  . CBC with Differential (Cancer Center Only)    Standing Status:   Standing    Number of Occurrences:   10    Standing Expiration Date:   06/19/2021  . CMP ([place] only)    Standing Status:   Standing    Number of Occurrences:   10    Standing Expiration Date:   06/19/2021   The patient has a good understanding of the overall plan. she agrees with it. she will call with any problems that may develop before the next visit here.  Total time spent: 30 mins including face to face time and time spent for planning, charting and coordination of care  Angas Isabell  Loyal Gambler, MD, MPH 06/19/2020  I, Molly Dorshimer, am acting as scribe for Dr. [name].  I have reviewed the above documentation for accuracy and completeness, and I agree with the above.

## 2020-06-18 NOTE — Assessment & Plan Note (Signed)
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 MRI 02/14/2015: T5 large destructive lesion with pathologic compression fracture and extensive epidural tumor, involvement of T4, excision metastatic carcinoma ER 60%, PR 0%, HER-2 positive ratio 2.71 average copy #7.45 03/27/2015: T4-6 decompression surgery for spinal cord compression PET-CT 12/10/17Rt para-spinal mass mildy hypermetabolic SUV 5.9, lytic cortical lesion 4.8 cm lesion left prox femur suv 8.8 favor osseus met disease Echo 04/12/2017 shows well preserved ejection fraction of 55-60%  Treatment summary: 1. Resumed anastrozole 05/21/2015, but it has caused significant hair loss so we switched her to exemestane 25 mg once daily 07/23/2015 2. Herceptin every 3 week started 04/10/2015 3. Bone metastases: Zometa every 3 months 4. Radiation therapy to the spine (Dr. [name]) 04/13/2015 to 05/19/2015 5. Radiation therapy to left femur 04/28/2016 to 05/12/2016  6. SRS to the brain lesion 11/24/2016 7.Kadcyla started 05/05/2017 discontinued 07/27/2017 8.Palbociclib +Faslodex +trastuzumab+ Pertuzumabstarted 08/10/2017, stopped 04/04/2018 9.XRT to T123/04/2019 10. Enhertu started 06/01/18-12/27/2019 stopped for progression 11SRS to brain January 2021 12.Lapatinib with anastrozole -------------------------------------------------------------------------------------------------------------------------- 05/19/2020: Bone scan: Progressive bone metastatic disease, bilateral ribs, thoracic and lumbar spine, pelvis, sternum, right humeral diaphysis, bilateral femoral, new lesions are seen calvaria, ribs, right humerus, L3 vertebral body and distal left femur  05/19/2020: CT CAP: New lytic bone lesion, similar lesions T12 and  L5, mild increase in the prevascular lymph nodes  Current Treatment: Xeloda with Tucatinib Xeloda Toxicities:  Tucatinib Toxicities:  We discussed the goals of care being palliation and prolongation of life.  We hope that her pain symptoms would ease by taking this treatment.  Hypercalcemia: Patient received Zometa  Pain issues: I sent a prescription for fentanyl patch 25 mcg every 72 hours.  Return to clinic in 4 weeks to assess tolerability to Xeloda and Tucatinib.

## 2020-06-19 ENCOUNTER — Other Ambulatory Visit: Payer: Self-pay

## 2020-06-19 ENCOUNTER — Inpatient Hospital Stay: Payer: Medicare Other

## 2020-06-19 ENCOUNTER — Inpatient Hospital Stay (HOSPITAL_BASED_OUTPATIENT_CLINIC_OR_DEPARTMENT_OTHER): Payer: Medicare Other | Admitting: Hematology and Oncology

## 2020-06-19 ENCOUNTER — Inpatient Hospital Stay: Payer: Medicare Other | Attending: Hematology and Oncology

## 2020-06-19 ENCOUNTER — Other Ambulatory Visit: Payer: Self-pay | Admitting: *Deleted

## 2020-06-19 DIAGNOSIS — C50911 Malignant neoplasm of unspecified site of right female breast: Secondary | ICD-10-CM

## 2020-06-19 DIAGNOSIS — Z923 Personal history of irradiation: Secondary | ICD-10-CM | POA: Insufficient documentation

## 2020-06-19 DIAGNOSIS — C50411 Malignant neoplasm of upper-outer quadrant of right female breast: Secondary | ICD-10-CM

## 2020-06-19 DIAGNOSIS — Z170 Estrogen receptor positive status [ER+]: Secondary | ICD-10-CM | POA: Insufficient documentation

## 2020-06-19 DIAGNOSIS — Z5112 Encounter for antineoplastic immunotherapy: Secondary | ICD-10-CM | POA: Insufficient documentation

## 2020-06-19 DIAGNOSIS — Z79811 Long term (current) use of aromatase inhibitors: Secondary | ICD-10-CM | POA: Diagnosis not present

## 2020-06-19 DIAGNOSIS — Z9011 Acquired absence of right breast and nipple: Secondary | ICD-10-CM | POA: Insufficient documentation

## 2020-06-19 DIAGNOSIS — C7951 Secondary malignant neoplasm of bone: Secondary | ICD-10-CM

## 2020-06-19 DIAGNOSIS — Z79899 Other long term (current) drug therapy: Secondary | ICD-10-CM | POA: Diagnosis not present

## 2020-06-19 DIAGNOSIS — G9529 Other cord compression: Secondary | ICD-10-CM

## 2020-06-19 DIAGNOSIS — C7931 Secondary malignant neoplasm of brain: Secondary | ICD-10-CM | POA: Insufficient documentation

## 2020-06-19 DIAGNOSIS — Z9221 Personal history of antineoplastic chemotherapy: Secondary | ICD-10-CM | POA: Diagnosis not present

## 2020-06-19 LAB — CBC WITH DIFFERENTIAL (CANCER CENTER ONLY)
Abs Immature Granulocytes: 0.02 10*3/uL (ref 0.00–0.07)
Basophils Absolute: 0 10*3/uL (ref 0.0–0.1)
Basophils Relative: 1 %
Eosinophils Absolute: 0.1 10*3/uL (ref 0.0–0.5)
Eosinophils Relative: 3 %
HCT: 36.9 % (ref 36.0–46.0)
Hemoglobin: 12 g/dL (ref 12.0–15.0)
Immature Granulocytes: 1 %
Lymphocytes Relative: 14 %
Lymphs Abs: 0.4 10*3/uL — ABNORMAL LOW (ref 0.7–4.0)
MCH: 29.3 pg (ref 26.0–34.0)
MCHC: 32.5 g/dL (ref 30.0–36.0)
MCV: 90 fL (ref 80.0–100.0)
Monocytes Absolute: 0.4 10*3/uL (ref 0.1–1.0)
Monocytes Relative: 13 %
Neutro Abs: 2.3 10*3/uL (ref 1.7–7.7)
Neutrophils Relative %: 68 %
Platelet Count: 87 10*3/uL — ABNORMAL LOW (ref 150–400)
RBC: 4.1 MIL/uL (ref 3.87–5.11)
RDW: 17.8 % — ABNORMAL HIGH (ref 11.5–15.5)
WBC Count: 3.3 10*3/uL — ABNORMAL LOW (ref 4.0–10.5)
nRBC: 0 % (ref 0.0–0.2)

## 2020-06-19 LAB — CMP (CANCER CENTER ONLY)
ALT: 16 U/L (ref 0–44)
AST: 30 U/L (ref 15–41)
Albumin: 3.6 g/dL (ref 3.5–5.0)
Alkaline Phosphatase: 284 U/L — ABNORMAL HIGH (ref 38–126)
Anion gap: 5 (ref 5–15)
BUN: 13 mg/dL (ref 8–23)
CO2: 25 mmol/L (ref 22–32)
Calcium: 9.2 mg/dL (ref 8.9–10.3)
Chloride: 103 mmol/L (ref 98–111)
Creatinine: 1.02 mg/dL — ABNORMAL HIGH (ref 0.44–1.00)
GFR, Estimated: 58 mL/min — ABNORMAL LOW (ref >60)
Glucose, Bld: 122 mg/dL — ABNORMAL HIGH (ref 70–99)
Potassium: 3.9 mmol/L (ref 3.5–5.1)
Sodium: 133 mmol/L — ABNORMAL LOW (ref 135–145)
Total Bilirubin: 1.4 mg/dL — ABNORMAL HIGH (ref 0.3–1.2)
Total Protein: 7 g/dL (ref 6.5–8.1)

## 2020-06-19 LAB — BASIC METABOLIC PANEL
Anion gap: 7 (ref 5–15)
BUN: 13 mg/dL (ref 8–23)
CO2: 25 mmol/L (ref 22–32)
Calcium: 9.3 mg/dL (ref 8.9–10.3)
Chloride: 103 mmol/L (ref 98–111)
Creatinine, Ser: 1 mg/dL (ref 0.44–1.00)
GFR, Estimated: 59 mL/min — ABNORMAL LOW (ref >60)
Glucose, Bld: 120 mg/dL — ABNORMAL HIGH (ref 70–99)
Potassium: 3.9 mmol/L (ref 3.5–5.1)
Sodium: 135 mmol/L (ref 135–145)

## 2020-06-19 MED ORDER — SODIUM CHLORIDE 0.9% FLUSH
10.0000 mL | Freq: Once | INTRAVENOUS | Status: AC
  Administered 2020-06-19: 10 mL
  Filled 2020-06-19: qty 10

## 2020-06-19 MED ORDER — CYANOCOBALAMIN 1000 MCG/ML IJ SOLN
INTRAMUSCULAR | Status: AC
  Filled 2020-06-19: qty 1

## 2020-06-19 MED ORDER — TRASTUZUMAB-DKST CHEMO 150 MG IV SOLR
6.0000 mg/kg | Freq: Once | INTRAVENOUS | Status: AC
  Administered 2020-06-19: 336 mg via INTRAVENOUS
  Filled 2020-06-19: qty 16

## 2020-06-19 MED ORDER — CYANOCOBALAMIN 1000 MCG/ML IJ SOLN
1000.0000 ug | INTRAMUSCULAR | Status: DC
  Administered 2020-06-19: 1000 ug via INTRAMUSCULAR

## 2020-06-19 MED ORDER — HEPARIN SOD (PORK) LOCK FLUSH 100 UNIT/ML IV SOLN
500.0000 [IU] | Freq: Once | INTRAVENOUS | Status: AC | PRN
  Administered 2020-06-19: 500 [IU]
  Filled 2020-06-19: qty 5

## 2020-06-19 MED ORDER — ACETAMINOPHEN 325 MG PO TABS
ORAL_TABLET | ORAL | Status: AC
  Filled 2020-06-19: qty 2

## 2020-06-19 MED ORDER — SODIUM CHLORIDE 0.9 % IV SOLN
Freq: Once | INTRAVENOUS | Status: AC
Start: 2020-06-19 — End: 2020-06-19
  Filled 2020-06-19: qty 250

## 2020-06-19 MED ORDER — SODIUM CHLORIDE 0.9% FLUSH
10.0000 mL | INTRAVENOUS | Status: DC | PRN
  Administered 2020-06-19: 10 mL
  Filled 2020-06-19: qty 10

## 2020-06-19 MED ORDER — ACETAMINOPHEN 325 MG PO TABS
650.0000 mg | ORAL_TABLET | Freq: Once | ORAL | Status: AC
  Administered 2020-06-19: 650 mg via ORAL

## 2020-06-19 NOTE — Patient Instructions (Signed)
[facility] Discharge Instructions for Patients Receiving Chemotherapy  Today you received the following chemotherapy agents: trastuzumab (OGIVRI).  To help prevent nausea and vomiting after your treatment, we encourage you to take your nausea medication as directed.   If you develop nausea and vomiting that is not controlled by your nausea medication, call the clinic.   BELOW ARE SYMPTOMS THAT SHOULD BE REPORTED IMMEDIATELY:  *FEVER GREATER THAN 100.5 F  *CHILLS WITH OR WITHOUT FEVER  NAUSEA AND VOMITING THAT IS NOT CONTROLLED WITH YOUR NAUSEA MEDICATION  *UNUSUAL SHORTNESS OF BREATH  *UNUSUAL BRUISING OR BLEEDING  TENDERNESS IN MOUTH AND THROAT WITH OR WITHOUT PRESENCE OF ULCERS  *URINARY PROBLEMS  *BOWEL PROBLEMS  UNUSUAL RASH Items with * indicate a potential emergency and should be followed up as soon as possible.  Feel free to call the clinic should you have any questions or concerns. The clinic phone number is [phone].  Please show the [place] at check-in to the Emergency Department and triage nurse.

## 2020-06-19 NOTE — Patient Instructions (Signed)

## 2020-06-23 ENCOUNTER — Telehealth: Payer: Self-pay

## 2020-06-23 ENCOUNTER — Telehealth: Payer: Self-pay | Admitting: Hematology and Oncology

## 2020-06-23 NOTE — Telephone Encounter (Signed)
Called patient to remind of session tomorrow.   Gaylyn Rong Counseling Intern

## 2020-06-23 NOTE — Telephone Encounter (Signed)
Scheduled per 2/11 los. Called and spoke with pt,confirmed added appts on 3/4

## 2020-06-24 NOTE — Progress Notes (Signed)
[place] Patient and [facility] Counseling Note  The patient reported that not much was going on and shared how her experience with mindfulness had been. Using these techniques helped to give the patient a sense of "power" over her feelings. Counselor and patient discussed being sensitive to other's feelings, appreciating differences amongst people, and her recent experiences with her treatments. Patient identified wanting to continue to work on mindfulness. Counselor provided validation of feelings, reflective listening, and asked probing questions. Patient was oriented times three, in a relaxed mood, and had an appropriate to context affect. There were no indications of SI/HI/NSSI. Patient is experiencing a lot of uncertainty and thinking a lot about her future. Thinking "Am I dying?," "What is going to happen to me?" and "Can I plan for the future" has resulted in worry, stress, anxiety, uncertainty and fear. These feelings result in the patient ruminating on the future. As her health has improved, these thoughts, feelings and behaviors have decreased. Next session will continue to work on processing experiences and expanding coping skills.  Gaylyn Rong Counseling Intern

## 2020-06-30 ENCOUNTER — Telehealth: Payer: Self-pay

## 2020-06-30 NOTE — Telephone Encounter (Signed)
Called patient to remind of session tomorrow.   Gaylyn Rong Counseling Intern

## 2020-07-01 NOTE — Progress Notes (Signed)
[place] Patient and [facility] Counseling Note  The patient discussed the "yo-yo effect" of having good and bad days, as well as what thoughts and emotions came up for her regarding these experiences. Patient described the thoughts and feelings she has around her cancer and its treatment. Patient and counselor discussed her troubles with eating anything, as well as began to process her feelings around the counselor's departure from the [place] in two months. Counselor provided empathic listening, psychoeducation, and reflections of feeling and content. Patient was oriented times three, in a mostly sad mood with a congruent affect. There were no signs of SI/HI/NSSI. Patient is experiencing a lot of uncertainty and thinking a lot about her future. Thinking "Am I dying?," "What is going to happen to me?" and "Can I plan for the future" has resulted in worry, stress, anxiety, uncertainty and fear. These feelings result in the patient ruminating on the future. The relative health that the patient feels each day correlates to how much or little distress she feels. Next session will discuss going forward and making plans, as well as introduce gratitude practices.    Gaylyn Rong Counseling Intern

## 2020-07-07 ENCOUNTER — Telehealth: Payer: Self-pay

## 2020-07-07 MED FILL — CAPECITABINE 500 MG TABLET: 500 | 21 days supply | Qty: 56 | Fill #2

## 2020-07-07 NOTE — Telephone Encounter (Signed)
Called to remind and confirm of session tomorrow.  Gaylyn Rong Counseling Intern

## 2020-07-08 ENCOUNTER — Encounter (HOSPITAL_COMMUNITY): Payer: Medicare Other | Admitting: Internal Medicine

## 2020-07-08 ENCOUNTER — Other Ambulatory Visit (HOSPITAL_COMMUNITY): Payer: Medicare Other

## 2020-07-08 NOTE — Progress Notes (Signed)
[place] Patient and [facility] Counseling Note  The patient reported having some "really good" interactions with her family, which she had initiated. The chemo symptoms that have been coming back try to "weigh" on her but the patient is able to keep them from overwhelming her. The counselor and patient discussed the patient's plan for weddings, which the counselor connected to gratitude and introduced a gratitude journal. The counselor and patient also discussed communication and the counselor leaving in 2 months. The last part of session reflected on growth/experience. The counselor provided gentle challenging, empathic listening, and reflections. The patient was oriented times three. Her mood was relaxed with a congruent affect. There were no indications of SI/HI/NSSI. Thinking "Am I dying?," "What is going to happen to me?" and "Can I plan for the future" has resulted in worry, stress, anxiety, uncertainty and fear. These feelings result in the patient ruminating on the future.The relative health that the patient feels each day correlates to how much or little distress she feels. Next session will continue exploring growth and gratitude.    Gaylyn Rong Counseling Intern

## 2020-07-09 NOTE — Assessment & Plan Note (Signed)
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 MRI 02/14/2015: T5 large destructive lesion with pathologic compression fracture and extensive epidural tumor, involvement of T4, excision metastatic carcinoma ER 60%, PR 0%, HER-2 positive ratio 2.71 average copy #7.45 03/27/2015: T4-6 decompression surgery for spinal cord compression PET-CT 12/10/17Rt para-spinal mass mildy hypermetabolic SUV 5.9, lytic cortical lesion 4.8 cm lesion left prox femur suv 8.8 favor osseus met disease Echo 04/12/2017 shows well preserved ejection fraction of 55-60%  Treatment summary: 1. Resumed anastrozole 05/21/2015, but it has caused significant hair loss so we switched her to exemestane 25 mg once daily 07/23/2015 2. Herceptin every 3 week started 04/10/2015 3. Bone metastases: Zometa every 3 months 4. Radiation therapy to the spine (Dr. Manning) 04/13/2015 to 05/19/2015 5. Radiation therapy to left femur 04/28/2016 to 05/12/2016  6. SRS to the brain lesion 11/24/2016 7.Kadcyla started 05/05/2017 discontinued 07/27/2017 8.Palbociclib +Faslodex +trastuzumab+ Pertuzumabstarted 08/10/2017, stopped 04/04/2018 9.XRT to T123/04/2019 10. Enhertu started 06/01/18-12/27/2019 stopped for progression 11SRS to brain January 2021 12.Lapatinib with anastrozole -------------------------------------------------------------------------------------------------------------------------- 05/19/2020: Bone scan: Progressive bone metastatic disease, bilateral ribs, thoracic and lumbar spine, pelvis, sternum, right humeral diaphysis, bilateral femoral, new lesions are seen calvaria, ribs, right humerus, L3 vertebral body and distal left femur  05/19/2020: CT CAP: New lytic bone lesion, similar lesions T12 and  L5, mild increase in the prevascular lymph nodes  Current Treatment: Xeloda with Tucatinib Xeloda Toxicities: Dec appetite, No hand foot syndrome, noticed some papular sores on hands Tucatinib Toxicities:denies diarrhea  B12 def: on q 3 week B 12 inj We discussed the goals of care being palliation and prolongation of life. We hope that her pain symptoms would ease by taking this treatment.  Hypercalcemia: Patient received Zometa  Pain issues: Better. Never took fentanyl.  Return to clinic in 4 weeks to assess tolerability to Xeloda and Tucatinib. 

## 2020-07-09 NOTE — Progress Notes (Signed)
Patient Care Team: [name], MD as PCP - General (Family Medicine)  DIAGNOSIS:    ICD-10-CM   1. Primary cancer of upper outer quadrant of right female breast ([place])  C50.411     SUMMARY OF ONCOLOGIC HISTORY: Oncology History  Primary cancer of upper outer quadrant of right female breast ([place])  03/08/2012 Initial Diagnosis   Right breast invasive ductal carcinoma ER positive PR negative HER-2 positive Ki-67 44%; another breast mass biopsied in the anterior part of the breast which was also positive for malignancy that was HER-2 negative   03/14/2012 Cancer Staging   Staging form: Breast, AJCC 7th Edition - Pathologic: Stage IV (M1) - Signed by [name], NP on 06/22/2018   05/10/2012 Surgery   Right mastectomy and axillary lymph node dissection: Multifocal disease 5 cm, 1.7 cm, 1.6 cm, ER positive PR negative HER-2 positive Ki-67 44%, 3/7 lymph nodes positive   06/14/2012 - 06/12/2013 Chemotherapy   Adjuvant chemotherapy with Pierce 6 followed by Herceptin maintenance   10/25/2012 - 12/11/2012 Radiation Therapy   Adjuvant radiation therapy   02/07/2013 -  Anti-estrogen oral therapy   Letrozole 2.5 mg daily   02/14/2015 Imaging   MRI spine: Large destructive T5 lesion with severe pathologic compression fracture and extensive epidural tumor, severe spinal stenosis with moderate cord compression, tumor involvement of T4   03/18/2015 PET scan   Residual enhancing soft tissue adjacent to the spinal cord. No evidence of metastatic disease. Nonspecific uptake the left nipple   03/27/2015 - 03/30/2015 Hospital Admission   T4-6 decompression for spinal cord compression (lower extremity paralysis)   04/10/2015 -  Chemotherapy   Palliative treatment with Herceptin every 3 weeks with letrozole 2.5 mg daily   04/13/2015 - 05/19/2015 Radiation Therapy   Palliative radiation treatment to the spine   09/01/2015 Imaging   CT chest abdomen pelvis: Pathologic fracture with posterior  fusion at T5 no other evidence of metastatic disease in the chest abdomen pelvis   12/23/2015 Imaging   CT CAP: new nonspecific 0.6 cm lymph node was stated mediastinum needs follow-up CT, innumerable tiny groundglass pulmonary nodules throughout both lungs unchanged, patchy consolidation from radiation, T5 fracture, no mets   04/15/2016 PET scan   Rt para-spinal mass mildy hypermetabolic SUV 5.9, lytic cortical lesion 4.8 cm lesion left prox femur suv 8.8 favor osseus met disease   04/28/2016 - 05/12/2016 Radiation Therapy   Palliative XRT Left femur   05/24/2016 Procedure   Soft tissue mass biopsy back: Metastatic breast cancer ER 80%, PR 0%, Ki-67 50%, HER-2 positive ratio 2.25   06/09/2016 - 03/29/2017 Anti-estrogen oral therapy   Faslodex with Herceptin and Perjeta every 4 weeks   06/13/2016 - 06/15/2016 Hospital Admission   uncomplicated revision of previous thoracic instrumentation.   10/10/2016 PET scan   Interval development of new hypermetabolic 2.5 x 1.1 cm lesion posterior right eighth rib consistent with metastatic disease, stable right paraspinal lesion at T8, clustered soft tissue nodules in the posterior midline back near cervicothoracic junction smaller than before    11/03/2016 - 11/07/2016 Radiation Therapy   Palliative radiation to right eighth rib   11/04/2016 Imaging   Solitary contrast-enhancing lesion in the right cerebellum 6 x 2 mm concerning for metastatic lesion   11/24/2016 - 11/24/2016 Radiation Therapy   SRS to the brain lesion   04/18/2017 PET scan   New ill-defined rounded airspace opacity in posterior right lower lobe. Differential diagnosis includes malignancy and infectious/inflammatory process.Increased hypermetabolic lytic lesion  in T8 vertebral body, consistent with bone metastasis.  Increased hypermetabolic posterior upper chest wall subcutaneous nodules, highly suspicious metastatic disease. Further improvement in right posterior eighth rib metastasis.    05/04/2017 - 07/27/2017 Chemotherapy   Kadcyla every 3 weeks palliative chemotherapy stopped for progression   07/26/2017 Imaging   Interval increase in size of the multiple enhancing masses posteriorly overlying the spine, interval development of left axillary and mediastinal adenopathy, increase in the size of lytic lesions in T8-T9 and left ninth rib, increased groundglass opacities right lower lobe lung   07/27/2017 - 05/11/2018 Chemotherapy   Palbociclib + Faslodex + trastuzumab + Pertuzumab     04/03/2018 PET scan   PET CT scan showed progression of disease not only in the bones but also in the subcutaneous nodules.   05/15/2018 - 05/25/2018 Radiation Therapy   Palliative radiation to the back   06/01/2018 -  Chemotherapy   Enhertu (Transtuzumab Deruxtecan)    08/07/2018 - 08/07/2018 Radiation Therapy   T12 vertebral body SRS   Carcinoma of right breast, stage 4 ([facility])  02/14/2015 Initial Diagnosis   Breast cancer metastasized to bone, right ([place])   06/01/2018 - 02/07/2020 Chemotherapy   The patient had palonosetron (ALOXI) injection 0.25 mg, 0.25 mg, Intravenous,  Once, 24 of 30 cycles Administration: 0.25 mg (06/01/2018), 0.25 mg (06/22/2018), 0.25 mg (08/24/2018), 0.25 mg (07/13/2018), 0.25 mg (08/03/2018), 0.25 mg (09/14/2018), 0.25 mg (10/05/2018), 0.25 mg (11/16/2018), 0.25 mg (10/26/2018), 0.25 mg (12/07/2018), 0.25 mg (12/28/2018), 0.25 mg (01/18/2019), 0.25 mg (03/15/2019), 0.25 mg (02/15/2019), 0.25 mg (04/12/2019), 0.25 mg (05/09/2019), 0.25 mg (06/07/2019), 0.25 mg (07/05/2019), 0.25 mg (08/09/2019), 0.25 mg (09/13/2019), 0.25 mg (10/18/2019), 0.25 mg (11/22/2019), 0.25 mg (12/27/2019) fam-trastuzumab deruxtecan-nxki (ENHERTU) 332 mg in dextrose 5 % 100 mL chemo infusion, 5.4 mg/kg = 332 mg, Intravenous,  Once, 24 of 30 cycles Dose modification: 4.4 mg/kg (original dose 5.4 mg/kg, Cycle 7, Reason: Provider Judgment), 3.2 mg/kg (original dose 5.4 mg/kg, Cycle 9, Reason: Dose not tolerated), 150 mg  (original dose 5.4 mg/kg, Cycle 18, Reason: Dose not tolerated) Administration: 332 mg (06/01/2018), 332 mg (06/22/2018), 332 mg (07/13/2018), 332 mg (08/24/2018), 332 mg (08/03/2018), 332 mg (09/14/2018), 272 mg (10/05/2018), 200 mg (11/16/2018), 272 mg (10/26/2018), 200 mg (12/07/2018), 200 mg (12/28/2018), 200 mg (01/18/2019), 200 mg (03/15/2019), 200 mg (02/15/2019), 200 mg (04/12/2019), 200 mg (05/09/2019), 200 mg (06/07/2019), 150 mg (07/05/2019), 150 mg (08/09/2019), 150 mg (09/13/2019), 150 mg (10/18/2019), 150 mg (11/22/2019), 150 mg (12/27/2019)  for chemotherapy treatment.    05/29/2020 -  Chemotherapy      Patient is on Antibody Plan: BREAST TRASTUZUMAB Q21D    Brain metastasis ([place])  11/18/2016 Initial Diagnosis   Brain metastasis ([place])   05/29/2020 -  Chemotherapy      Patient is on Antibody Plan: BREAST TRASTUZUMAB Q21D      CHIEF COMPLIANT: Follow-up of metastatic breast cancer  INTERVAL HISTORY: Heather [name] is a 75 y.o. with above-mentioned history of metastatic breast cancercurrentlytreatment with Xeloda andTukysaand Herceptin.She presentsto the clinic todayfora toxicity check and treatment.  She is having profound nausea and vomiting because of Xeloda.  She also has severe acid reflux.  She has felt pain in redness of her hands and feet.  No active disclamation.  During the week of Xeloda she feels better and she is eating better.  She feels that the food does not taste the same as before and is more salty.  ALLERGIES:  is allergic to other, acyclovir and related, chlorhexidine,  monascus purpureus went yeast, pravastatin sodium, zanaflex [tizanidine hcl], codeine, keflex [cephalexin], naprosyn [naproxen], oxycodone, tramadol, and tussin [guaifenesin].  MEDICATIONS:  Current Outpatient Medications  Medication Sig Dispense Refill  . acetaminophen (TYLENOL) 500 MG tablet Take 500 mg by mouth every 6 (six) hours as needed.    . ALPRAZolam (XANAX) 0.25 MG tablet Take 1 tablet by mouth as  needed.    Marland Kitchen BOOSTRIX 5-2.5-18.5 LF-MCG/0.5 injection     . capecitabine (XELODA) 500 MG tablet Take 2 tablets (1,000 mg total) by mouth 2 (two) times daily after a meal. Take for 14 days on, 7 days off, repeat every 21 days. 56 tablet 6  . Cholecalciferol (VITAMIN D) 2000 UNITS tablet Take 2,000 Units by mouth daily.    . COLLAGEN PO Take by mouth.    . Cranberry 250 MG CAPS Take by mouth.    . famotidine (PEPCID) 40 MG tablet Take 40 mg by mouth daily.    Marland Kitchen gabapentin (NEURONTIN) 600 MG tablet Take 1 tablet (600 mg total) by mouth as directed. Take 1 tablet in the AM and afternoon and 1 1/2 tabs at bedtime 315 tablet 2  . HYDROcodone-acetaminophen (NORCO/VICODIN) 5-325 MG tablet Take 1 tablet by mouth every 6 (six) hours as needed for moderate pain. 30 tablet 0  . lidocaine-prilocaine (EMLA) cream Apply to affected area once 30 g 3  . ondansetron (ZOFRAN) 8 MG tablet Take 1 tablet (8 mg total) by mouth every 8 (eight) hours as needed for nausea or vomiting. 20 tablet 0  . OVER THE COUNTER MEDICATION Place 1 drop into both eyes 2 (two) times daily as needed (dry eyes). Over the counter lubricating eye drops    . polyethylene glycol (MIRALAX / GLYCOLAX) packet Take 17 g by mouth daily as needed.    . prochlorperazine (COMPAZINE) 10 MG tablet Take 1 tablet (10 mg total) by mouth every 6 (six) hours as needed for nausea or vomiting. 60 tablet 3  . spironolactone (ALDACTONE) 25 MG tablet Take 25 mg by mouth daily.    . tucatinib (TUKYSA) 150 MG tablet Take 2 tablets (300 mg total) by mouth in the morning and at bedtime. Take every 12 hrs at the same time each day with or without a meal. 120 tablet 6   No current facility-administered medications for this visit.   Facility-Administered Medications Ordered in Other Visits  Medication Dose Route Frequency Provider Last Rate Last Admin  . cyanocobalamin ((VITAMIN B-12)) injection 1,000 mcg  1,000 mcg Intramuscular Q6 weeks [name], MD   1,000 mcg  at 03/19/20 1110  . heparin lock flush 100 unit/mL  500 Units Intracatheter Once [name], MD      . sodium chloride flush (NS) 0.9 % injection 10 mL  10 mL Intracatheter Once [name], MD        PHYSICAL EXAMINATION: ECOG PERFORMANCE STATUS: 1 - Symptomatic but completely ambulatory  Vitals:   07/10/20 0839  BP: 134/60  Pulse: 73  Resp: 20  Temp: (!) 97.5 F (36.4 C)  SpO2: 100%   Filed Weights   07/10/20 0839  Weight: 121 lb 3.2 oz (55 kg)    LABORATORY DATA:  I have reviewed the data as listed CMP Latest Ref Rng & Units 06/19/2020 06/19/2020 05/29/2020  Glucose 70 - 99 mg/dL 122(H) 120(H) 112(H)  BUN 8 - 23 mg/dL $Remove'13 13 19  'zDwTDnc$ Creatinine 0.44 - 1.00 mg/dL 1.02(H) 1.00 0.78  Sodium 135 - 145 mmol/L 133(L) 135 133(L)  Potassium  3.5 - 5.1 mmol/L 3.9 3.9 3.7  Chloride 98 - 111 mmol/L 103 103 100  CO2 22 - 32 mmol/L 25 25 21(L)  Calcium 8.9 - 10.3 mg/dL 9.2 9.3 9.8  Total Protein 6.5 - 8.1 g/dL 7.0 - 7.3  Total Bilirubin 0.3 - 1.2 mg/dL 1.4(H) - 1.1  Alkaline Phos 38 - 126 U/L 284(H) - 192(H)  AST 15 - 41 U/L 30 - 62(H)  ALT 0 - 44 U/L 16 - 24    Lab Results  Component Value Date   WBC 3.3 (L) 07/10/2020   HGB 12.4 07/10/2020   HCT 36.4 07/10/2020   MCV 91.7 07/10/2020   PLT 103 (L) 07/10/2020   NEUTROABS 2.3 07/10/2020    ASSESSMENT & PLAN:  Primary cancer of upper outer quadrant of right female breast ([place]) Right breast invasive ductal carcinoma ER positive PR negative HER-2 positive Ki-67 44% multifocal disease 3/7 lymph nodes positive T2 N1 M0 stage IIB status post adjuvant chemotherapy with TCH followed by Herceptin maintenance, adjuvant radiation therapy and Letrozole. MRI 02/14/2015: T5 large destructive lesion with pathologic compression fracture and extensive epidural tumor, involvement of T4, excision metastatic carcinoma ER 60%, PR 0%, HER-2 positive ratio 2.71 average copy #7.45 03/27/2015: T4-6 decompression surgery for spinal cord  compression PET-CT 12/10/17Rt para-spinal mass mildy hypermetabolic SUV 5.9, lytic cortical lesion 4.8 cm lesion left prox femur suv 8.8 favor osseus met disease Echo 04/12/2017 shows well preserved ejection fraction of 55-60%  Treatment summary: 1. Resumed anastrozole 05/21/2015, but it has caused significant hair loss so we switched her to exemestane 25 mg once daily 07/23/2015 2. Herceptin every 3 week started 04/10/2015 3. Bone metastases: Zometa every 3 months 4. Radiation therapy to the spine (Dr. [name]) 04/13/2015 to 05/19/2015 5. Radiation therapy to left femur 04/28/2016 to 05/12/2016  6. SRS to the brain lesion 11/24/2016 7.Kadcyla started 05/05/2017 discontinued 07/27/2017 8.Palbociclib +Faslodex +trastuzumab+ Pertuzumabstarted 08/10/2017, stopped 04/04/2018 9.XRT to T123/04/2019 10. Enhertu started 06/01/18-12/27/2019 stopped for progression 11SRS to brain January 2021 12.Lapatinib with anastrozole -------------------------------------------------------------------------------------------------------------------------- 05/19/2020: Bone scan: Progressive bone metastatic disease, bilateral ribs, thoracic and lumbar spine, pelvis, sternum, right humeral diaphysis, bilateral femoral, new lesions are seen calvaria, ribs, right humerus, L3 vertebral body and distal left femur  05/19/2020: CT CAP: New lytic bone lesion, similar lesions T12 and L5, mild increase in the prevascular lymph nodes  Current Treatment: Xeloda with Tucatinib Xeloda Toxicities: Dec appetite, No hand foot syndrome, noticed some papular sores on hands Tucatinib Toxicities:denies diarrhea 1. Nausea and vomiting: I will reduce the dosage of Xeloda to 2 tablets in the morning and 1 tablet at night. 2. Hand-foot syndrome: Mild: Moisturizers and decreased dosage of Xeloda 3. Fatigue 4.  Leukopenia: Stable 5.  Thrombocytopenia: Mild  B12 def: on q 3 week B 12 inj We discussed the goals of care being  palliation and prolongation of life. We hope that her pain symptoms would ease by taking this treatment.  Hypercalcemia: Patient received Zometa  Pain issues: Better. Never took fentanyl.  Return to clinic for Herceptin. We will plan for scans to be done in April.    No orders of the defined types were placed in this encounter.  The patient has a good understanding of the overall plan. she agrees with it. she will call with any problems that may develop before the next visit here.  Total time spent: 30 mins including face to face time and time spent for planning, charting and coordination of care  Kingjames Coury K  [name], MD, MPH 07/10/2020  I, Molly Dorshimer, am acting as scribe for Dr. [name].  I have reviewed the above documentation for accuracy and completeness, and I agree with the above.

## 2020-07-10 ENCOUNTER — Inpatient Hospital Stay: Payer: Medicare Other

## 2020-07-10 ENCOUNTER — Other Ambulatory Visit: Payer: Self-pay

## 2020-07-10 ENCOUNTER — Inpatient Hospital Stay: Payer: Medicare Other | Attending: Hematology and Oncology

## 2020-07-10 ENCOUNTER — Inpatient Hospital Stay (HOSPITAL_BASED_OUTPATIENT_CLINIC_OR_DEPARTMENT_OTHER): Payer: Medicare Other | Admitting: Hematology and Oncology

## 2020-07-10 VITALS — BP 134/60 | HR 73 | Temp 97.5°F | Resp 20 | Ht 63.0 in | Wt 121.2 lb

## 2020-07-10 DIAGNOSIS — Z9221 Personal history of antineoplastic chemotherapy: Secondary | ICD-10-CM | POA: Diagnosis not present

## 2020-07-10 DIAGNOSIS — C7951 Secondary malignant neoplasm of bone: Secondary | ICD-10-CM | POA: Diagnosis not present

## 2020-07-10 DIAGNOSIS — Z923 Personal history of irradiation: Secondary | ICD-10-CM | POA: Diagnosis not present

## 2020-07-10 DIAGNOSIS — Z79899 Other long term (current) drug therapy: Secondary | ICD-10-CM | POA: Insufficient documentation

## 2020-07-10 DIAGNOSIS — Z5112 Encounter for antineoplastic immunotherapy: Secondary | ICD-10-CM | POA: Insufficient documentation

## 2020-07-10 DIAGNOSIS — Z79811 Long term (current) use of aromatase inhibitors: Secondary | ICD-10-CM | POA: Diagnosis not present

## 2020-07-10 DIAGNOSIS — C50411 Malignant neoplasm of upper-outer quadrant of right female breast: Secondary | ICD-10-CM | POA: Insufficient documentation

## 2020-07-10 DIAGNOSIS — C7931 Secondary malignant neoplasm of brain: Secondary | ICD-10-CM

## 2020-07-10 DIAGNOSIS — C50911 Malignant neoplasm of unspecified site of right female breast: Secondary | ICD-10-CM

## 2020-07-10 DIAGNOSIS — Z170 Estrogen receptor positive status [ER+]: Secondary | ICD-10-CM | POA: Diagnosis not present

## 2020-07-10 LAB — CBC WITH DIFFERENTIAL (CANCER CENTER ONLY)
Abs Immature Granulocytes: 0.01 10*3/uL (ref 0.00–0.07)
Basophils Absolute: 0 10*3/uL (ref 0.0–0.1)
Basophils Relative: 1 %
Eosinophils Absolute: 0.1 10*3/uL (ref 0.0–0.5)
Eosinophils Relative: 2 %
HCT: 36.4 % (ref 36.0–46.0)
Hemoglobin: 12.4 g/dL (ref 12.0–15.0)
Immature Granulocytes: 0 %
Lymphocytes Relative: 14 %
Lymphs Abs: 0.5 10*3/uL — ABNORMAL LOW (ref 0.7–4.0)
MCH: 31.2 pg (ref 26.0–34.0)
MCHC: 34.1 g/dL (ref 30.0–36.0)
MCV: 91.7 fL (ref 80.0–100.0)
Monocytes Absolute: 0.5 10*3/uL (ref 0.1–1.0)
Monocytes Relative: 15 %
Neutro Abs: 2.3 10*3/uL (ref 1.7–7.7)
Neutrophils Relative %: 68 %
Platelet Count: 103 10*3/uL — ABNORMAL LOW (ref 150–400)
RBC: 3.97 MIL/uL (ref 3.87–5.11)
RDW: 22.1 % — ABNORMAL HIGH (ref 11.5–15.5)
WBC Count: 3.3 10*3/uL — ABNORMAL LOW (ref 4.0–10.5)
nRBC: 0 % (ref 0.0–0.2)

## 2020-07-10 LAB — CMP (CANCER CENTER ONLY)
ALT: 33 U/L (ref 0–44)
AST: 36 U/L (ref 15–41)
Albumin: 3.9 g/dL (ref 3.5–5.0)
Alkaline Phosphatase: 267 U/L — ABNORMAL HIGH (ref 38–126)
Anion gap: 9 (ref 5–15)
BUN: 16 mg/dL (ref 8–23)
CO2: 25 mmol/L (ref 22–32)
Calcium: 9.6 mg/dL (ref 8.9–10.3)
Chloride: 100 mmol/L (ref 98–111)
Creatinine: 1.07 mg/dL — ABNORMAL HIGH (ref 0.44–1.00)
GFR, Estimated: 55 mL/min — ABNORMAL LOW (ref >60)
Glucose, Bld: 103 mg/dL — ABNORMAL HIGH (ref 70–99)
Potassium: 4.2 mmol/L (ref 3.5–5.1)
Sodium: 134 mmol/L — ABNORMAL LOW (ref 135–145)
Total Bilirubin: 1.2 mg/dL (ref 0.3–1.2)
Total Protein: 7.3 g/dL (ref 6.5–8.1)

## 2020-07-10 MED ORDER — SODIUM CHLORIDE 0.9 % IV SOLN
Freq: Once | INTRAVENOUS | Status: AC
  Filled 2020-07-10: qty 250

## 2020-07-10 MED ORDER — ACETAMINOPHEN 325 MG PO TABS
ORAL_TABLET | ORAL | Status: AC
  Filled 2020-07-10: qty 2

## 2020-07-10 MED ORDER — ACETAMINOPHEN 325 MG PO TABS
650.0000 mg | ORAL_TABLET | Freq: Once | ORAL | Status: AC
  Administered 2020-07-10: 650 mg via ORAL

## 2020-07-10 MED ORDER — SODIUM CHLORIDE 0.9% FLUSH
10.0000 mL | INTRAVENOUS | Status: DC | PRN
  Administered 2020-07-10: 10 mL
  Filled 2020-07-10: qty 10

## 2020-07-10 MED ORDER — TRASTUZUMAB-DKST CHEMO 150 MG IV SOLR
6.0000 mg/kg | Freq: Once | INTRAVENOUS | Status: AC
  Administered 2020-07-10: 336 mg via INTRAVENOUS
  Filled 2020-07-10: qty 16

## 2020-07-10 MED ORDER — HEPARIN SOD (PORK) LOCK FLUSH 100 UNIT/ML IV SOLN
500.0000 [IU] | Freq: Once | INTRAVENOUS | Status: AC | PRN
  Administered 2020-07-10: 500 [IU]
  Filled 2020-07-10: qty 5

## 2020-07-10 MED ORDER — CAPECITABINE 500 MG PO TABS: 1000.0000 mg | ORAL_TABLET | Freq: Two times a day (BID) | ORAL | 6 refills | Status: DC

## 2020-07-10 MED ORDER — SODIUM CHLORIDE 0.9% FLUSH
10.0000 mL | Freq: Once | INTRAVENOUS | Status: AC
  Administered 2020-07-10: 10 mL
  Filled 2020-07-10: qty 10

## 2020-07-10 NOTE — Patient Instructions (Signed)
Implanted Port Insertion, Care After This sheet gives you information about how to care for yourself after your procedure. Your health care provider may also give you more specific instructions. If you have problems or questions, contact your health care provider. What can I expect after the procedure? After the procedure, it is common to have:  Discomfort at the port insertion site.  Bruising on the skin over the port. This should improve over 3-4 days. Follow these instructions at home: Port care  After your port is placed, you will get a manufacturer's information card. The card has information about your port. Keep this card with you at all times.  Take care of the port as told by your health care provider. Ask your health care provider if you or a family member can get training for taking care of the port at home. A home health care nurse may also take care of the port.  Make sure to remember what type of port you have. Incision care  Follow instructions from your health care provider about how to take care of your port insertion site. Make sure you: ? Wash your hands with soap and water before and after you change your bandage (dressing). If soap and water are not available, use hand sanitizer. ? Change your dressing as told by your health care provider. ? Leave stitches (sutures), skin glue, or adhesive strips in place. These skin closures may need to stay in place for 2 weeks or longer. If adhesive strip edges start to loosen and curl up, you may trim the loose edges. Do not remove adhesive strips completely unless your health care provider tells you to do that.  Check your port insertion site every day for signs of infection. Check for: ? Redness, swelling, or pain. ? Fluid or blood. ? Warmth. ? Pus or a bad smell.      Activity  Return to your normal activities as told by your health care provider. Ask your health care provider what activities are safe for you.  Do not  lift anything that is heavier than 10 lb (4.5 kg), or the limit that you are told, until your health care provider says that it is safe. General instructions  Take over-the-counter and prescription medicines only as told by your health care provider.  Do not take baths, swim, or use a hot tub until your health care provider approves. Ask your health care provider if you may take showers. You may only be allowed to take sponge baths.  Do not drive for 24 hours if you were given a sedative during your procedure.  Wear a medical alert bracelet in case of an emergency. This will tell any health care providers that you have a port.  Keep all follow-up visits as told by your health care provider. This is important. Contact a health care provider if:  You cannot flush your port with saline as directed, or you cannot draw blood from the port.  You have a fever or chills.  You have redness, swelling, or pain around your port insertion site.  You have fluid or blood coming from your port insertion site.  Your port insertion site feels warm to the touch.  You have pus or a bad smell coming from the port insertion site. Get help right away if:  You have chest pain or shortness of breath.  You have bleeding from your port that you cannot control. Summary  Take care of the port as told by your   health care provider. Keep the manufacturer's information card with you at all times.  Change your dressing as told by your health care provider.  Contact a health care provider if you have a fever or chills or if you have redness, swelling, or pain around your port insertion site.  Keep all follow-up visits as told by your health care provider. This information is not intended to replace advice given to you by your health care provider. Make sure you discuss any questions you have with your health care provider. Document Revised: 11/21/2017 Document Reviewed: 11/21/2017 Elsevier Patient Education   2021 [employer] Inc.  

## 2020-07-10 NOTE — Patient Instructions (Signed)
[facility] Discharge Instructions for Patients Receiving Chemotherapy  Today you received the following chemotherapy agents trastuzumab.  To help prevent nausea and vomiting after your treatment, we encourage you to take your nausea medication as directed.    If you develop nausea and vomiting that is not controlled by your nausea medication, call the clinic.   BELOW ARE SYMPTOMS THAT SHOULD BE REPORTED IMMEDIATELY:  *FEVER GREATER THAN 100.5 F  *CHILLS WITH OR WITHOUT FEVER  NAUSEA AND VOMITING THAT IS NOT CONTROLLED WITH YOUR NAUSEA MEDICATION  *UNUSUAL SHORTNESS OF BREATH  *UNUSUAL BRUISING OR BLEEDING  TENDERNESS IN MOUTH AND THROAT WITH OR WITHOUT PRESENCE OF ULCERS  *URINARY PROBLEMS  *BOWEL PROBLEMS  UNUSUAL RASH Items with * indicate a potential emergency and should be followed up as soon as possible.  Feel free to call the clinic should you have any questions or concerns. The clinic phone number is [phone].  Please show the CHEMO ALERT CARD at check-in to the Emergency Department and triage nurse.   

## 2020-07-14 ENCOUNTER — Telehealth: Payer: Self-pay

## 2020-07-14 NOTE — Telephone Encounter (Signed)
Moved 3/9 session to 3/10.   Gaylyn Rong Counseling Intern

## 2020-07-15 ENCOUNTER — Telehealth: Payer: Self-pay

## 2020-07-15 NOTE — Telephone Encounter (Signed)
Left VM to confirm time for tomorrow.  Gaylyn Rong Counseling Intern

## 2020-07-16 NOTE — Progress Notes (Signed)
[place] Patient and [facility] Counseling Note  The patient and counselor first discussed the impact of weather on mood. The patient spent time describing the "best time" she had with her sons and granddaughter over the past weekend. The patient and counselor shared other updates, before discussing what the patient has learned about herself while in counseling, as well as areas she wants to continue to grow in. The patient also disclosed being worried about handling future "dark times" without the counselor. The counselor asked how the patient could take care of herself, and she came up with the idea of getting a prayer partner. The counselor provided reflective listening, a safe space to process and validation of feelings. The patient was oriented times three and showed no signs of SI/HI/NSSI. Her mood was somewhat morose with an appropriate to context affect. mood was relaxed with a congruent affect. There were no indications of SI/HI/NSSI. Thinking "Am I dying?," "What is going to happen to me?" and "Can I plan for the future" has resulted in worry, stress, anxiety, uncertainty and fear. These feelings result in the patient ruminating on the future.The relative health that the patient feels each day correlates to how much or little distress she feels. Next session will focus on gratitude and taking care of the self.   Gaylyn Rong Counseling Intern

## 2020-07-21 ENCOUNTER — Telehealth: Payer: Self-pay

## 2020-07-21 NOTE — Telephone Encounter (Signed)
Called patient to remind and confirm about session tomorrow.  Gaylyn Rong Counseling Intern

## 2020-07-22 ENCOUNTER — Telehealth: Payer: Self-pay | Admitting: *Deleted

## 2020-07-22 NOTE — Telephone Encounter (Signed)
Received call from pt with complaint of severe fatigue x2 weeks.  Pt states she sleeps constantly on and off throughout the day.  Pt also states she is experiencing an increase in nervousness x2 weeks. Pt also states she recently cut her finger and it took 2 days for the bleeding to stop and is questioning if her plts are low.  Pt requesting for labs to be drawn and an apt with MD to discuss Xeloda.  Apts scheduled and pt verbalized understanding of date and time.

## 2020-07-22 NOTE — Progress Notes (Signed)
[place] Patient and [facility] Counseling Note  The patient reported bad fatigue and a small cut that would not stop bleeding recently. The patient's health was a big concern for her over the past week. Using a journal was something new the patient tried and she found it really helpful and relieving for her. The patient and counselor spent time discussing what it will be like for her family after she passes. The patient appreciates the chance to reminisce and wants to pass down family knowledge, mementos, and history. The counselor provided active listening, reflections, and validation of feeling. The patient was oriented times three with a reflective mood. Her affect was appropriate to context and no signs of SI/HI/NSSI were present. Thinking "Am I dying?," "What is going to happen to me?" and "Can I plan for the future" has resulted in worry, stress, anxiety, uncertainty and fear. These feelings result in the patient ruminating on the future.The relative health that the patient feels each day correlates to how much or little distress she feels. Next session will focus on family reminiscing and journaling.    Gaylyn Rong Counseling Intern

## 2020-07-26 NOTE — Progress Notes (Signed)
Patient Care Team: [name], MD as PCP - General (Family Medicine)  DIAGNOSIS:    ICD-10-CM   1. Spinal cord compression due to malignant neoplasm metastatic to spine (HCC)  G95.29    C79.51   2. Metastatic cancer to spine ([place])  C79.51   3. Primary cancer of upper outer quadrant of right female breast ([place])  C50.411     SUMMARY OF ONCOLOGIC HISTORY: Oncology History  Primary cancer of upper outer quadrant of right female breast ([place])  03/08/2012 Initial Diagnosis   Right breast invasive ductal carcinoma ER positive PR negative HER-2 positive Ki-67 44%; another breast mass biopsied in the anterior part of the breast which was also positive for malignancy that was HER-2 negative   03/14/2012 Cancer Staging   Staging form: Breast, AJCC 7th Edition - Pathologic: Stage IV (M1) - Signed by [name], NP on 06/22/2018   05/10/2012 Surgery   Right mastectomy and axillary lymph node dissection: Multifocal disease 5 cm, 1.7 cm, 1.6 cm, ER positive PR negative HER-2 positive Ki-67 44%, 3/7 lymph nodes positive   06/14/2012 - 06/12/2013 Chemotherapy   Adjuvant chemotherapy with [place] 6 followed by Herceptin maintenance   10/25/2012 - 12/11/2012 Radiation Therapy   Adjuvant radiation therapy   02/07/2013 -  Anti-estrogen oral therapy   Letrozole 2.5 mg daily   02/14/2015 Imaging   MRI spine: Large destructive T5 lesion with severe pathologic compression fracture and extensive epidural tumor, severe spinal stenosis with moderate cord compression, tumor involvement of T4   03/18/2015 PET scan   Residual enhancing soft tissue adjacent to the spinal cord. No evidence of metastatic disease. Nonspecific uptake the left nipple   03/27/2015 - 03/30/2015 Hospital Admission   T4-6 decompression for spinal cord compression (lower extremity paralysis)   04/10/2015 -  Chemotherapy   Palliative treatment with Herceptin every 3 weeks with letrozole 2.5 mg daily   04/13/2015 - 05/19/2015  Radiation Therapy   Palliative radiation treatment to the spine   09/01/2015 Imaging   CT chest abdomen pelvis: Pathologic fracture with posterior fusion at T5 no other evidence of metastatic disease in the chest abdomen pelvis   12/23/2015 Imaging   CT CAP: new nonspecific 0.6 cm lymph node was stated mediastinum needs follow-up CT, innumerable tiny groundglass pulmonary nodules throughout both lungs unchanged, patchy consolidation from radiation, T5 fracture, no mets   04/15/2016 PET scan   Rt para-spinal mass mildy hypermetabolic SUV 5.9, lytic cortical lesion 4.8 cm lesion left prox femur suv 8.8 favor osseus met disease   04/28/2016 - 05/12/2016 Radiation Therapy   Palliative XRT Left femur   05/24/2016 Procedure   Soft tissue mass biopsy back: Metastatic breast cancer ER 80%, PR 0%, Ki-67 50%, HER-2 positive ratio 2.25   06/09/2016 - 03/29/2017 Anti-estrogen oral therapy   Faslodex with Herceptin and Perjeta every 4 weeks   06/13/2016 - 06/15/2016 Hospital Admission   uncomplicated revision of previous thoracic instrumentation.   10/10/2016 PET scan   Interval development of new hypermetabolic 2.5 x 1.1 cm lesion posterior right eighth rib consistent with metastatic disease, stable right paraspinal lesion at T8, clustered soft tissue nodules in the posterior midline back near cervicothoracic junction smaller than before    11/03/2016 - 11/07/2016 Radiation Therapy   Palliative radiation to right eighth rib   11/04/2016 Imaging   Solitary contrast-enhancing lesion in the right cerebellum 6 x 2 mm concerning for metastatic lesion   11/24/2016 - 11/24/2016 Radiation Therapy   SRS to  the brain lesion   04/18/2017 PET scan   New ill-defined rounded airspace opacity in posterior right lower lobe. Differential diagnosis includes malignancy and infectious/inflammatory process.Increased hypermetabolic lytic lesion in T8 vertebral body, consistent with bone metastasis.  Increased hypermetabolic  posterior upper chest wall subcutaneous nodules, highly suspicious metastatic disease. Further improvement in right posterior eighth rib metastasis.   05/04/2017 - 07/27/2017 Chemotherapy   Kadcyla every 3 weeks palliative chemotherapy stopped for progression   07/26/2017 Imaging   Interval increase in size of the multiple enhancing masses posteriorly overlying the spine, interval development of left axillary and mediastinal adenopathy, increase in the size of lytic lesions in T8-T9 and left ninth rib, increased groundglass opacities right lower lobe lung   07/27/2017 - 05/11/2018 Chemotherapy   Palbociclib + Faslodex + trastuzumab + Pertuzumab     04/03/2018 PET scan   PET CT scan showed progression of disease not only in the bones but also in the subcutaneous nodules.   05/15/2018 - 05/25/2018 Radiation Therapy   Palliative radiation to the back   06/01/2018 -  Chemotherapy   Enhertu (Transtuzumab Deruxtecan)    08/07/2018 - 08/07/2018 Radiation Therapy   T12 vertebral body SRS   Carcinoma of right breast, stage 4 (HCC)  02/14/2015 Initial Diagnosis   Breast cancer metastasized to bone, right ([place])   06/01/2018 - 02/07/2020 Chemotherapy   The patient had palonosetron (ALOXI) injection 0.25 mg, 0.25 mg, Intravenous,  Once, 24 of 30 cycles Administration: 0.25 mg (06/01/2018), 0.25 mg (06/22/2018), 0.25 mg (08/24/2018), 0.25 mg (07/13/2018), 0.25 mg (08/03/2018), 0.25 mg (09/14/2018), 0.25 mg (10/05/2018), 0.25 mg (11/16/2018), 0.25 mg (10/26/2018), 0.25 mg (12/07/2018), 0.25 mg (12/28/2018), 0.25 mg (01/18/2019), 0.25 mg (03/15/2019), 0.25 mg (02/15/2019), 0.25 mg (04/12/2019), 0.25 mg (05/09/2019), 0.25 mg (06/07/2019), 0.25 mg (07/05/2019), 0.25 mg (08/09/2019), 0.25 mg (09/13/2019), 0.25 mg (10/18/2019), 0.25 mg (11/22/2019), 0.25 mg (12/27/2019) fam-trastuzumab deruxtecan-nxki (ENHERTU) 332 mg in dextrose 5 % 100 mL chemo infusion, 5.4 mg/kg = 332 mg, Intravenous,  Once, 24 of 30 cycles Dose modification: 4.4 mg/kg  (original dose 5.4 mg/kg, Cycle 7, Reason: Provider Judgment), 3.2 mg/kg (original dose 5.4 mg/kg, Cycle 9, Reason: Dose not tolerated), 150 mg (original dose 5.4 mg/kg, Cycle 18, Reason: Dose not tolerated) Administration: 332 mg (06/01/2018), 332 mg (06/22/2018), 332 mg (07/13/2018), 332 mg (08/24/2018), 332 mg (08/03/2018), 332 mg (09/14/2018), 272 mg (10/05/2018), 200 mg (11/16/2018), 272 mg (10/26/2018), 200 mg (12/07/2018), 200 mg (12/28/2018), 200 mg (01/18/2019), 200 mg (03/15/2019), 200 mg (02/15/2019), 200 mg (04/12/2019), 200 mg (05/09/2019), 200 mg (06/07/2019), 150 mg (07/05/2019), 150 mg (08/09/2019), 150 mg (09/13/2019), 150 mg (10/18/2019), 150 mg (11/22/2019), 150 mg (12/27/2019)  for chemotherapy treatment.    05/29/2020 -  Chemotherapy      Patient is on Antibody Plan: BREAST TRASTUZUMAB Q21D    Brain metastasis ([place])  11/18/2016 Initial Diagnosis   Brain metastasis ([place])   05/29/2020 -  Chemotherapy      Patient is on Antibody Plan: BREAST TRASTUZUMAB Q21D      CHIEF COMPLIANT: Follow-up of metastatic breast cancer  INTERVAL HISTORY: Heather [name] is a 75 y.o. with above-mentioned history of metastatic breast cancercurrentlytreatment with Xeloda andTukysaand Herceptin.She presentsto the clinic todayfora toxicity checkand treatment.   Her major complaint today is significant fatigue.  She is also having continued hand-foot syndrome symptoms but overall the symptoms appear to be stable and have not gotten any worse.  She denies any pain in her hands.  She has gotten significantly more tired  than before.  It could be because of not receiving her rule out B12 injection on the last treatment but it could also related to Xeloda.  ALLERGIES:  is allergic to other, acyclovir and related, chlorhexidine, monascus purpureus went yeast, pravastatin sodium, zanaflex [tizanidine hcl], codeine, keflex [cephalexin], naprosyn [naproxen], oxycodone, tramadol, and tussin [guaifenesin].  MEDICATIONS:   Current Outpatient Medications  Medication Sig Dispense Refill  . acetaminophen (TYLENOL) 500 MG tablet Take 500 mg by mouth every 6 (six) hours as needed.    . ALPRAZolam (XANAX) 0.25 MG tablet Take 1 tablet by mouth as needed.    Marland Kitchen BOOSTRIX 5-2.5-18.5 LF-MCG/0.5 injection     . capecitabine (XELODA) 500 MG tablet Take 2 tablets (1,000 mg total) by mouth 2 (two) times daily after a meal. Take 2 tabs in Am and 1 tab in PM, Take for 14 days on, 7 days off, repeat every 21 days. 56 tablet 6  . Cholecalciferol (VITAMIN D) 2000 UNITS tablet Take 2,000 Units by mouth daily.    . COLLAGEN PO Take by mouth.    . Cranberry 250 MG CAPS Take by mouth.    . famotidine (PEPCID) 40 MG tablet Take 40 mg by mouth daily.    Marland Kitchen gabapentin (NEURONTIN) 600 MG tablet Take 1 tablet (600 mg total) by mouth as directed. Take 1 tablet in the AM and afternoon and 1 1/2 tabs at bedtime 315 tablet 2  . HYDROcodone-acetaminophen (NORCO/VICODIN) 5-325 MG tablet Take 1 tablet by mouth every 6 (six) hours as needed for moderate pain. 30 tablet 0  . lidocaine-prilocaine (EMLA) cream Apply to affected area once 30 g 3  . ondansetron (ZOFRAN) 8 MG tablet Take 1 tablet (8 mg total) by mouth every 8 (eight) hours as needed for nausea or vomiting. 20 tablet 0  . OVER THE COUNTER MEDICATION Place 1 drop into both eyes 2 (two) times daily as needed (dry eyes). Over the counter lubricating eye drops    . polyethylene glycol (MIRALAX / GLYCOLAX) packet Take 17 g by mouth daily as needed.    . prochlorperazine (COMPAZINE) 10 MG tablet Take 1 tablet (10 mg total) by mouth every 6 (six) hours as needed for nausea or vomiting. 60 tablet 3  . spironolactone (ALDACTONE) 25 MG tablet Take 25 mg by mouth daily.    . tucatinib (TUKYSA) 150 MG tablet Take 2 tablets (300 mg total) by mouth in the morning and at bedtime. Take every 12 hrs at the same time each day with or without a meal. 120 tablet 6   No current facility-administered medications  for this visit.   Facility-Administered Medications Ordered in Other Visits  Medication Dose Route Frequency Provider Last Rate Last Admin  . cyanocobalamin ((VITAMIN B-12)) injection 1,000 mcg  1,000 mcg Intramuscular Q6 weeks [name], MD   1,000 mcg at 03/19/20 1110  . heparin lock flush 100 unit/mL  500 Units Intracatheter Once [name], MD      . sodium chloride flush (NS) 0.9 % injection 10 mL  10 mL Intracatheter Once [name], MD        PHYSICAL EXAMINATION: ECOG PERFORMANCE STATUS: 1 - Symptomatic but completely ambulatory  Vitals:   07/27/20 0850  BP: 138/63  Pulse: 70  Resp: 18  Temp: (!) 97.5 F (36.4 C)  SpO2: 99%   Filed Weights   07/27/20 0850  Weight: 121 lb 14.4 oz (55.3 kg)     LABORATORY DATA:  I have reviewed the data as  listed CMP Latest Ref Rng & Units 07/27/2020 07/10/2020 06/19/2020  Glucose 70 - 99 mg/dL 102(H) 103(H) 122(H)  BUN 8 - 23 mg/dL _0 Creatinine 0.44 - 1.00 mg/dL 1.18(H) 1.07(H) 1.02(H)  Sodium 135 - 145 mmol/L 134(L) 134(L) 133(L)  Potassium 3.5 - 5.1 mmol/L 3.9 4.2 3.9  Chloride 98 - 111 mmol/L 101 100 103  CO2 22 - 32 mmol/L _1 Calcium 8.9 - 10.3 mg/dL 10.0 9.6 9.2  Total Protein 6.5 - 8.1 g/dL 7.5 7.3 7.0  Total Bilirubin 0.3 - 1.2 mg/dL 1.9(H) 1.2 1.4(H)  Alkaline Phos 38 - 126 U/L 176(H) 267(H) 284(H)  AST 15 - 41 U/L 37 36 30  ALT 0 - 44 U/L 21 33 16    Lab Results  Component Value Date   WBC 3.5 (L) 07/27/2020   HGB 13.0 07/27/2020   HCT 38.7 07/27/2020   MCV 96.0 07/27/2020   PLT 98 (L) 07/27/2020   NEUTROABS 2.5 07/27/2020    ASSESSMENT & PLAN:  Primary cancer of upper outer quadrant of right female breast ([place]) Right breast invasive ductal carcinoma ER positive PR negative HER-2 positive Ki-67 44% multifocal disease 3/7 lymph nodes positive T2 N1 M0 stage IIB status post adjuvant chemotherapy with TCH followed by Herceptin maintenance, adjuvant radiation therapy and Letrozole. MRI  02/14/2015: T5 large destructive lesion with pathologic compression fracture and extensive epidural tumor, involvement of T4, excision metastatic carcinoma ER 60%, PR 0%, HER-2 positive ratio 2.71 average copy #7.45 03/27/2015: T4-6 decompression surgery for spinal cord compression PET-CT 12/10/17Rt para-spinal mass mildy hypermetabolic SUV 5.9, lytic cortical lesion 4.8 cm lesion left prox femur suv 8.8 favor osseus met disease Echo 04/12/2017 shows well preserved ejection fraction of 55-60%  Treatment summary: 1. Resumed anastrozole 05/21/2015, but it has caused significant hair loss so we switched her to exemestane 25 mg once daily 07/23/2015 2. Herceptin every 3 week started 04/10/2015 3. Bone metastases: Zometa every 3 months 4. Radiation therapy to the spine (Dr. [name]) 04/13/2015 to 05/19/2015 5. Radiation therapy to left femur 04/28/2016 to 05/12/2016  6. SRS to the brain lesion 11/24/2016 7.Kadcyla started 05/05/2017 discontinued 07/27/2017 8.Palbociclib +Faslodex +trastuzumab+ Pertuzumabstarted 08/10/2017, stopped 04/04/2018 9.XRT to T123/04/2019 10. Enhertu started 06/01/18-12/27/2019 stopped for progression 11SRS to brain January 2021 12.Lapatinib with anastrozole -------------------------------------------------------------------------------------------------------------------------- 05/19/2020: Bone scan: Progressive bone metastatic disease, bilateral ribs, thoracic and lumbar spine, pelvis, sternum, right humeral diaphysis, bilateral femoral, new lesions are seen calvaria, ribs, right humerus, L3 vertebral body and distal left femur  05/19/2020: CT CAP: New lytic bone lesion, similar lesions T12 and L5, mild increase in the prevascular lymph nodes  CurrentTreatment: Xeloda with Tucatinib XelodaToxicities:Dec appetite, No hand foot syndrome, noticed some papular sores on hands Tucatinib Toxicities:denies diarrhea 1. Nausea and vomiting: Xeloda dose reduction. 2.  Hand-foot syndrome: Mild: Moisturizers and decreased dosage of Xeloda 3. Fatigue: Progressively getting worse therefore will reduce Xeloda to 1 tablet in the morning and 1 in the night 4.  Leukopenia: Stable 5.  Thrombocytopenia: Mild  B12 def: on q 3 week B 12 inj    Hypercalcemia: Patient receivedZometa  Pain issues:Better. Never took fentanyl.  Return to clinic for Herceptin. We will plan for scans to be done in April.    No orders of the defined types were placed in this encounter.  The patient has a good understanding of the overall plan. she agrees with it. she will call with any problems that may develop before the next visit here.  Total time spent: 30 mins including face to face time and time spent for planning, charting and coordination of care  Rulon Eisenmenger, MD, MPH 07/27/2020  I, Molly Dorshimer, am acting as scribe for Dr. [name].  I have reviewed the above documentation for accuracy and completeness, and I agree with the above.

## 2020-07-27 ENCOUNTER — Other Ambulatory Visit: Payer: Self-pay

## 2020-07-27 ENCOUNTER — Inpatient Hospital Stay (HOSPITAL_BASED_OUTPATIENT_CLINIC_OR_DEPARTMENT_OTHER): Payer: Medicare Other | Admitting: Hematology and Oncology

## 2020-07-27 ENCOUNTER — Inpatient Hospital Stay: Payer: Medicare Other

## 2020-07-27 VITALS — BP 138/63 | HR 70 | Temp 97.5°F | Resp 18 | Ht 63.0 in | Wt 121.9 lb

## 2020-07-27 DIAGNOSIS — G9529 Other cord compression: Secondary | ICD-10-CM | POA: Diagnosis not present

## 2020-07-27 DIAGNOSIS — C7951 Secondary malignant neoplasm of bone: Secondary | ICD-10-CM | POA: Diagnosis not present

## 2020-07-27 DIAGNOSIS — C7931 Secondary malignant neoplasm of brain: Secondary | ICD-10-CM | POA: Diagnosis not present

## 2020-07-27 DIAGNOSIS — C50411 Malignant neoplasm of upper-outer quadrant of right female breast: Secondary | ICD-10-CM

## 2020-07-27 DIAGNOSIS — Z5112 Encounter for antineoplastic immunotherapy: Secondary | ICD-10-CM | POA: Diagnosis not present

## 2020-07-27 DIAGNOSIS — Z170 Estrogen receptor positive status [ER+]: Secondary | ICD-10-CM | POA: Diagnosis not present

## 2020-07-27 DIAGNOSIS — Z9221 Personal history of antineoplastic chemotherapy: Secondary | ICD-10-CM | POA: Diagnosis not present

## 2020-07-27 LAB — CMP (CANCER CENTER ONLY)
ALT: 21 U/L (ref 0–44)
AST: 37 U/L (ref 15–41)
Albumin: 4.2 g/dL (ref 3.5–5.0)
Alkaline Phosphatase: 176 U/L — ABNORMAL HIGH (ref 38–126)
Anion gap: 6 (ref 5–15)
BUN: 14 mg/dL (ref 8–23)
CO2: 27 mmol/L (ref 22–32)
Calcium: 10 mg/dL (ref 8.9–10.3)
Chloride: 101 mmol/L (ref 98–111)
Creatinine: 1.18 mg/dL — ABNORMAL HIGH (ref 0.44–1.00)
GFR, Estimated: 48 mL/min — ABNORMAL LOW (ref >60)
Glucose, Bld: 102 mg/dL — ABNORMAL HIGH (ref 70–99)
Potassium: 3.9 mmol/L (ref 3.5–5.1)
Sodium: 134 mmol/L — ABNORMAL LOW (ref 135–145)
Total Bilirubin: 1.9 mg/dL — ABNORMAL HIGH (ref 0.3–1.2)
Total Protein: 7.5 g/dL (ref 6.5–8.1)

## 2020-07-27 LAB — CBC WITH DIFFERENTIAL (CANCER CENTER ONLY)
Abs Immature Granulocytes: 0.01 10*3/uL (ref 0.00–0.07)
Basophils Absolute: 0 10*3/uL (ref 0.0–0.1)
Basophils Relative: 1 %
Eosinophils Absolute: 0.1 10*3/uL (ref 0.0–0.5)
Eosinophils Relative: 2 %
HCT: 38.7 % (ref 36.0–46.0)
Hemoglobin: 13 g/dL (ref 12.0–15.0)
Immature Granulocytes: 0 %
Lymphocytes Relative: 15 %
Lymphs Abs: 0.5 10*3/uL — ABNORMAL LOW (ref 0.7–4.0)
MCH: 32.3 pg (ref 26.0–34.0)
MCHC: 33.6 g/dL (ref 30.0–36.0)
MCV: 96 fL (ref 80.0–100.0)
Monocytes Absolute: 0.3 10*3/uL (ref 0.1–1.0)
Monocytes Relative: 10 %
Neutro Abs: 2.5 10*3/uL (ref 1.7–7.7)
Neutrophils Relative %: 72 %
Platelet Count: 98 10*3/uL — ABNORMAL LOW (ref 150–400)
RBC: 4.03 MIL/uL (ref 3.87–5.11)
RDW: 22.9 % — ABNORMAL HIGH (ref 11.5–15.5)
WBC Count: 3.5 10*3/uL — ABNORMAL LOW (ref 4.0–10.5)
nRBC: 0 % (ref 0.0–0.2)

## 2020-07-27 MED ORDER — CAPECITABINE 500 MG PO TABS: 500.0000 mg | ORAL_TABLET | Freq: Two times a day (BID) | ORAL | 6 refills | Status: DC

## 2020-07-27 NOTE — Assessment & Plan Note (Signed)
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 MRI 02/14/2015: T5 large destructive lesion with pathologic compression fracture and extensive epidural tumor, involvement of T4, excision metastatic carcinoma ER 60%, PR 0%, HER-2 positive ratio 2.71 average copy #7.45 03/27/2015: T4-6 decompression surgery for spinal cord compression PET-CT 12/10/17Rt para-spinal mass mildy hypermetabolic SUV 5.9, lytic cortical lesion 4.8 cm lesion left prox femur suv 8.8 favor osseus met disease Echo 04/12/2017 shows well preserved ejection fraction of 55-60%  Treatment summary: 1. Resumed anastrozole 05/21/2015, but it has caused significant hair loss so we switched her to exemestane 25 mg once daily 07/23/2015 2. Herceptin every 3 week started 04/10/2015 3. Bone metastases: Zometa every 3 months 4. Radiation therapy to the spine (Dr. [name]) 04/13/2015 to 05/19/2015 5. Radiation therapy to left femur 04/28/2016 to 05/12/2016  6. SRS to the brain lesion 11/24/2016 7.Kadcyla started 05/05/2017 discontinued 07/27/2017 8.Palbociclib +Faslodex +trastuzumab+ Pertuzumabstarted 08/10/2017, stopped 04/04/2018 9.XRT to T123/04/2019 10. Enhertu started 06/01/18-12/27/2019 stopped for progression 11SRS to brain January 2021 12.Lapatinib with anastrozole -------------------------------------------------------------------------------------------------------------------------- 05/19/2020: Bone scan: Progressive bone metastatic disease, bilateral ribs, thoracic and lumbar spine, pelvis, sternum, right humeral diaphysis, bilateral femoral, new lesions are seen calvaria, ribs, right humerus, L3 vertebral body and distal left femur  05/19/2020: CT CAP: New lytic bone lesion, similar lesions T12 and  L5, mild increase in the prevascular lymph nodes  CurrentTreatment: Xeloda with Tucatinib XelodaToxicities:Dec appetite, No hand foot syndrome, noticed some papular sores on hands Tucatinib Toxicities:denies diarrhea 1. Nausea and vomiting: Xeloda dose reduction. 2. Hand-foot syndrome: Mild: Moisturizers and decreased dosage of Xeloda 3. Fatigue: Progressively getting worse therefore will reduce Xeloda to 1 tablet in the morning and 1 in the night 4.  Leukopenia: Stable 5.  Thrombocytopenia: Mild  B12 def: on q 3 week B 12 inj    Hypercalcemia: Patient receivedZometa  Pain issues:Better. Never took fentanyl.  Return to clinic for Herceptin. We will plan for scans to be done in April.

## 2020-07-28 ENCOUNTER — Telehealth: Payer: Self-pay | Admitting: Adult Health

## 2020-07-28 ENCOUNTER — Telehealth: Payer: Self-pay

## 2020-07-28 NOTE — Telephone Encounter (Signed)
Called patient to remind of session tomorrow.   Gaylyn Rong Counseling Intern

## 2020-07-28 NOTE — Telephone Encounter (Signed)
Cancelled appt with Mendel Ryder due to provider being on PAL. Per MD okay for patient to have infusion without seeing a provider before hand.

## 2020-07-29 ENCOUNTER — Other Ambulatory Visit: Payer: Self-pay | Admitting: Radiation Therapy

## 2020-07-29 DIAGNOSIS — C7949 Secondary malignant neoplasm of other parts of nervous system: Secondary | ICD-10-CM

## 2020-07-29 DIAGNOSIS — C7931 Secondary malignant neoplasm of brain: Secondary | ICD-10-CM

## 2020-07-29 NOTE — Progress Notes (Signed)
[place] Patient and [facility] Counseling Note  Counselor and patient discussed the weather and talking about the day so far. The patient reported on a recent meeting with a doctor, and counselor reflected emotions. The patient and counselor also discussed fatigue and church, with counselor utilizing reflections. The counselor challenged patient on use of "I should" language. The counselor and patient processed journaling and discussed list-making. Counselor provided psychoeducation, process questions, reflections, and a safe space to share. The patient was oriented times three and in a happy mood. Her affect was appropriate to context and she showed no signs of SI/HI/NSSI. Thinking "Am I dying?," "What is going to happen to me?" and "Can I plan for the future" has resulted in worry, stress, anxiety, uncertainty and fear. These feelings result in the patient ruminating on the future.The relative health that the patient feels each day correlates to how much or little distress she feels. By engaging in journaling, the patient has been able to reduce distress related to unhelpful thoughts. Next session will discuss journaling and growth.   Gaylyn Rong Counseling Intern

## 2020-07-30 ENCOUNTER — Other Ambulatory Visit: Payer: Self-pay | Admitting: Radiation Therapy

## 2020-07-30 ENCOUNTER — Other Ambulatory Visit: Payer: Self-pay | Admitting: Hematology and Oncology

## 2020-07-30 ENCOUNTER — Other Ambulatory Visit: Payer: Self-pay | Admitting: *Deleted

## 2020-07-30 DIAGNOSIS — C50411 Malignant neoplasm of upper-outer quadrant of right female breast: Secondary | ICD-10-CM

## 2020-07-31 ENCOUNTER — Other Ambulatory Visit: Payer: Self-pay

## 2020-07-31 ENCOUNTER — Other Ambulatory Visit: Payer: Self-pay | Admitting: *Deleted

## 2020-07-31 ENCOUNTER — Other Ambulatory Visit: Payer: Self-pay | Admitting: Hematology and Oncology

## 2020-07-31 ENCOUNTER — Other Ambulatory Visit (HOSPITAL_COMMUNITY): Payer: Self-pay

## 2020-07-31 ENCOUNTER — Inpatient Hospital Stay: Payer: Medicare Other

## 2020-07-31 VITALS — BP 134/70 | HR 69 | Temp 97.7°F | Resp 16 | Wt 121.1 lb

## 2020-07-31 DIAGNOSIS — Z170 Estrogen receptor positive status [ER+]: Secondary | ICD-10-CM | POA: Diagnosis not present

## 2020-07-31 DIAGNOSIS — Z5112 Encounter for antineoplastic immunotherapy: Secondary | ICD-10-CM | POA: Diagnosis not present

## 2020-07-31 DIAGNOSIS — C50411 Malignant neoplasm of upper-outer quadrant of right female breast: Secondary | ICD-10-CM

## 2020-07-31 DIAGNOSIS — C7951 Secondary malignant neoplasm of bone: Secondary | ICD-10-CM

## 2020-07-31 DIAGNOSIS — C7931 Secondary malignant neoplasm of brain: Secondary | ICD-10-CM

## 2020-07-31 DIAGNOSIS — Z9221 Personal history of antineoplastic chemotherapy: Secondary | ICD-10-CM | POA: Diagnosis not present

## 2020-07-31 DIAGNOSIS — C50911 Malignant neoplasm of unspecified site of right female breast: Secondary | ICD-10-CM

## 2020-07-31 MED ORDER — HEPARIN SOD (PORK) LOCK FLUSH 100 UNIT/ML IV SOLN
500.0000 [IU] | Freq: Once | INTRAVENOUS | Status: AC | PRN
  Administered 2020-07-31: 500 [IU]
  Filled 2020-07-31: qty 5

## 2020-07-31 MED ORDER — SODIUM CHLORIDE 0.9% FLUSH
10.0000 mL | INTRAVENOUS | Status: DC | PRN
  Administered 2020-07-31: 10 mL
  Filled 2020-07-31: qty 10

## 2020-07-31 MED ORDER — CYANOCOBALAMIN 1000 MCG/ML IJ SOLN
INTRAMUSCULAR | Status: AC
  Filled 2020-07-31: qty 1

## 2020-07-31 MED ORDER — ACETAMINOPHEN 325 MG PO TABS
ORAL_TABLET | ORAL | Status: AC
  Filled 2020-07-31: qty 2

## 2020-07-31 MED ORDER — TRASTUZUMAB-DKST CHEMO 150 MG IV SOLR
6.0000 mg/kg | Freq: Once | INTRAVENOUS | Status: AC
  Administered 2020-07-31: 336 mg via INTRAVENOUS
  Filled 2020-07-31: qty 16

## 2020-07-31 MED ORDER — CYANOCOBALAMIN 1000 MCG/ML IJ SOLN
1000.0000 ug | INTRAMUSCULAR | Status: DC
  Administered 2020-07-31: 1000 ug via INTRAMUSCULAR

## 2020-07-31 MED ORDER — ACETAMINOPHEN 325 MG PO TABS
650.0000 mg | ORAL_TABLET | Freq: Once | ORAL | Status: AC
  Administered 2020-07-31: 650 mg via ORAL

## 2020-07-31 MED ORDER — CAPECITABINE 500 MG PO TABS: 500.0000 mg | ORAL_TABLET | Freq: Two times a day (BID) | ORAL | 6 refills | Status: DC

## 2020-07-31 MED ORDER — SODIUM CHLORIDE 0.9 % IV SOLN
Freq: Once | INTRAVENOUS | Status: AC
  Filled 2020-07-31: qty 250

## 2020-07-31 MED FILL — CAPECITABINE 500 MG TABLET: 500 | 21 days supply | Qty: 28 | Fill #0

## 2020-07-31 NOTE — Patient Instructions (Signed)
[facility] Discharge Instructions for Patients Receiving Chemotherapy  Today you received the following chemotherapy agents trastuzumab.  To help prevent nausea and vomiting after your treatment, we encourage you to take your nausea medication as directed.    If you develop nausea and vomiting that is not controlled by your nausea medication, call the clinic.   BELOW ARE SYMPTOMS THAT SHOULD BE REPORTED IMMEDIATELY:  *FEVER GREATER THAN 100.5 F  *CHILLS WITH OR WITHOUT FEVER  NAUSEA AND VOMITING THAT IS NOT CONTROLLED WITH YOUR NAUSEA MEDICATION  *UNUSUAL SHORTNESS OF BREATH  *UNUSUAL BRUISING OR BLEEDING  TENDERNESS IN MOUTH AND THROAT WITH OR WITHOUT PRESENCE OF ULCERS  *URINARY PROBLEMS  *BOWEL PROBLEMS  UNUSUAL RASH Items with * indicate a potential emergency and should be followed up as soon as possible.  Feel free to call the clinic should you have any questions or concerns. The clinic phone number is [phone].  Please show the CHEMO ALERT CARD at check-in to the Emergency Department and triage nurse.   

## 2020-08-04 ENCOUNTER — Telehealth: Payer: Self-pay

## 2020-08-04 NOTE — Telephone Encounter (Signed)
Spoke with patient to remind of session scheduled for tomorrow.   Gaylyn Rong  Counseling Intern

## 2020-08-05 NOTE — Progress Notes (Signed)
[place] [facility] and [facility] Counseling Note  The patient reported not really journaling since previous session, so there was not anything to process with that. Patient and counselor discussed priorities, retirement, meaning-making, and the importance of laughter. Session time was also used to reflect on the COVID-19 pandemic. The counselor provided a safe space to process, active listening, and reflections. The patient was oriented times three, in a mostly content mood with an appropriate to context affect. She showed no indications of SI/HI/NSSI. Thinking "Am I dying?," "What is going to happen to me?" and "Can I plan for the future" has resulted in worry, stress, anxiety, uncertainty and fear. These feelings result in the patient ruminating on the future.The relative health that the patient feels each day correlates to how much or little distress she feels. By engaging in journaling, the patient has been able to reduce distress related to unhelpful thoughts. Next session will discuss journaling and finding laughter.   Gaylyn Rong Counseling Intern

## 2020-08-11 ENCOUNTER — Telehealth: Payer: Self-pay

## 2020-08-11 NOTE — Telephone Encounter (Signed)
Called to remind and confirm patient session tomorrow.  Gaylyn Rong Counseling Intern

## 2020-08-12 NOTE — Progress Notes (Signed)
[place] Patient and [place] Counseling Note  Counselor and patient began session by discussing the highlights of the past week to practice gratitude. The counselor and patient discussed how seeking laughter went in the patient's life, as well as the upcoming hectic week for the patient at the [place]. The patient returned to gratitude and then shared he strong desire to be able to be busy and active. The patient believed having things to do would keep her mind off of anxiety-inducing topics. The counselor and patient discussed ways of being busy that do not involve being physically able. The patient compared herself to another friend, which counselor was able to reframe as another way to practice gratitude. The counselor provided active listening, reflections, and a safe space to share. The patient was oriented times three, and in a mostly apprehensive mood. The patient's affect was congruent and she showed no SI/HI/NSSI.  Thinking "Am I dying?," "What is going to happen to me?" and "Can I plan for the future" has resulted in worry, stress, anxiety, uncertainty and fear. These feelings result in the patient ruminating on the future.The relative health that the patient feels each day correlates to how much or little distress she feels. By engaging in journaling, the patient has been able to reduce distress related to unhelpful thoughts. Next session will review and introduce new anxiety reduction techniques.     Gaylyn Rong Counseling Intern

## 2020-08-13 ENCOUNTER — Other Ambulatory Visit: Payer: Medicare Other

## 2020-08-17 ENCOUNTER — Telehealth: Payer: Self-pay

## 2020-08-17 ENCOUNTER — Telehealth: Payer: Self-pay | Admitting: Radiation Oncology

## 2020-08-17 ENCOUNTER — Encounter: Payer: Self-pay | Admitting: Urology

## 2020-08-17 NOTE — Telephone Encounter (Signed)
Spoke with patient in regards to telephone appointment with Freeman Caldron PA on 08/26/20 @ 11:30am. Patient verbalized understanding of appointment date and time. Meaningful use questions were reviewed. TM

## 2020-08-19 ENCOUNTER — Encounter (HOSPITAL_COMMUNITY): Payer: Self-pay

## 2020-08-19 ENCOUNTER — Encounter (HOSPITAL_COMMUNITY)
Admission: RE | Admit: 2020-08-19 | Discharge: 2020-08-19 | Disposition: A | Discharge status: Home / Self Care | Payer: Medicare Other | Source: Ambulatory Visit | Attending: Hematology and Oncology | Admitting: Hematology and Oncology

## 2020-08-19 ENCOUNTER — Ambulatory Visit (HOSPITAL_COMMUNITY)
Admission: RE | Admit: 2020-08-19 | Discharge: 2020-08-19 | Disposition: A | Discharge status: Home / Self Care | Payer: Medicare Other | Source: Ambulatory Visit | Attending: Hematology and Oncology | Admitting: Hematology and Oncology

## 2020-08-19 ENCOUNTER — Other Ambulatory Visit: Payer: Self-pay

## 2020-08-19 DIAGNOSIS — K449 Diaphragmatic hernia without obstruction or gangrene: Secondary | ICD-10-CM | POA: Diagnosis not present

## 2020-08-19 DIAGNOSIS — K8080 Other cholelithiasis without obstruction: Secondary | ICD-10-CM | POA: Diagnosis not present

## 2020-08-19 DIAGNOSIS — C7931 Secondary malignant neoplasm of brain: Secondary | ICD-10-CM

## 2020-08-19 DIAGNOSIS — Z981 Arthrodesis status: Secondary | ICD-10-CM | POA: Diagnosis not present

## 2020-08-19 DIAGNOSIS — N644 Mastodynia: Secondary | ICD-10-CM | POA: Diagnosis not present

## 2020-08-19 DIAGNOSIS — C50411 Malignant neoplasm of upper-outer quadrant of right female breast: Secondary | ICD-10-CM | POA: Insufficient documentation

## 2020-08-19 DIAGNOSIS — C7951 Secondary malignant neoplasm of bone: Secondary | ICD-10-CM | POA: Diagnosis not present

## 2020-08-19 DIAGNOSIS — K8020 Calculus of gallbladder without cholecystitis without obstruction: Secondary | ICD-10-CM | POA: Diagnosis not present

## 2020-08-19 DIAGNOSIS — C50919 Malignant neoplasm of unspecified site of unspecified female breast: Secondary | ICD-10-CM | POA: Diagnosis not present

## 2020-08-19 MED ORDER — IOHEXOL 9 MG/ML PO SOLN
1000.0000 mL | ORAL | Status: AC
  Administered 2020-08-19: 1000 mL via ORAL

## 2020-08-19 MED ORDER — TECHNETIUM TC 99M MEDRONATE IV KIT
20.8000 | PACK | Freq: Once | INTRAVENOUS | Status: AC
  Administered 2020-08-19: 20.8 via INTRAVENOUS

## 2020-08-19 MED ORDER — IOHEXOL 9 MG/ML PO SOLN
ORAL | Status: AC
  Filled 2020-08-19: qty 1000

## 2020-08-19 MED ORDER — IOHEXOL 300 MG/ML  SOLN
100.0000 mL | Freq: Once | INTRAMUSCULAR | Status: AC | PRN
  Administered 2020-08-19: 100 mL via INTRAVENOUS

## 2020-08-19 MED ORDER — HEPARIN SOD (PORK) LOCK FLUSH 100 UNIT/ML IV SOLN
500.0000 [IU] | Freq: Once | INTRAVENOUS | Status: AC
  Administered 2020-08-19: 500 [IU] via INTRAVENOUS

## 2020-08-19 MED ORDER — HEPARIN SOD (PORK) LOCK FLUSH 100 UNIT/ML IV SOLN
INTRAVENOUS | Status: AC
  Filled 2020-08-19: qty 5

## 2020-08-19 NOTE — Progress Notes (Signed)
[place] Patient and [facility] Counseling Note  The patient reported being tired from mopping her floors but was still excited to revisit and learn new anxiety techniques. Mindfulness and breathing techniques were kinds of strategies the patient preferred in the past. The counselor explained deep breathing and led the patient in practice. The counselor and patient also discussed the five senses grounding technique and practiced. The counselor introduced PMR and discussed that with patient. After each technique was practiced or discussed, the counselor and patient processed each experience. The counselor and patient discussed how to utilize these techniques going forward, as well as how to adapt when she cannot move (I.e MRI). The counselor provided active listening, psychoeducation, coping skills, and reflections. The patient was oriented times three, and in a curious, but stressed mood. Her affect was appropriate to context. There were no signs of SI/HI/NSSI. Thinking "Am I dying?," "What is going to happen to me?" and "Can I plan for the future" has resulted in worry, stress, anxiety, uncertainty and fear. These feelings result in the patient ruminating on the future.The relative health that the patient feels each day correlates to how much or little distress she feels. By engaging in journaling, the patient has been able to reduce distress related to unhelpful thoughts. Next session will process the departure of the counselor and discuss the strategies used during treatment.    Gaylyn Rong Counseling Intern

## 2020-08-20 ENCOUNTER — Ambulatory Visit
Admission: RE | Admit: 2020-08-20 | Discharge: 2020-08-20 | Disposition: A | Discharge status: Home / Self Care | Payer: Medicare Other | Source: Ambulatory Visit | Attending: Radiation Oncology | Admitting: Radiation Oncology

## 2020-08-20 ENCOUNTER — Other Ambulatory Visit (HOSPITAL_COMMUNITY): Payer: Self-pay

## 2020-08-20 DIAGNOSIS — C7931 Secondary malignant neoplasm of brain: Secondary | ICD-10-CM

## 2020-08-20 DIAGNOSIS — C7951 Secondary malignant neoplasm of bone: Secondary | ICD-10-CM | POA: Diagnosis not present

## 2020-08-20 DIAGNOSIS — C50919 Malignant neoplasm of unspecified site of unspecified female breast: Secondary | ICD-10-CM | POA: Diagnosis not present

## 2020-08-20 MED ORDER — GADOBENATE DIMEGLUMINE 529 MG/ML IV SOLN
10.0000 mL | Freq: Once | INTRAVENOUS | Status: AC | PRN
  Administered 2020-08-20: 10 mL via INTRAVENOUS

## 2020-08-20 MED FILL — Capecitabine Tab 500 MG: ORAL | 21 days supply | Qty: 28 | Fill #0 | Status: AC

## 2020-08-21 ENCOUNTER — Ambulatory Visit: Payer: Medicare Other | Admitting: Hematology and Oncology

## 2020-08-21 ENCOUNTER — Inpatient Hospital Stay: Payer: Medicare Other

## 2020-08-21 ENCOUNTER — Other Ambulatory Visit: Payer: Self-pay

## 2020-08-21 ENCOUNTER — Inpatient Hospital Stay: Payer: Medicare Other | Attending: Hematology and Oncology

## 2020-08-21 ENCOUNTER — Ambulatory Visit: Payer: Medicare Other | Admitting: Adult Health

## 2020-08-21 ENCOUNTER — Other Ambulatory Visit (HOSPITAL_COMMUNITY): Payer: Self-pay

## 2020-08-21 VITALS — BP 154/65 | HR 69 | Temp 97.5°F | Resp 18

## 2020-08-21 DIAGNOSIS — Z95828 Presence of other vascular implants and grafts: Secondary | ICD-10-CM

## 2020-08-21 DIAGNOSIS — Z170 Estrogen receptor positive status [ER+]: Secondary | ICD-10-CM | POA: Diagnosis not present

## 2020-08-21 DIAGNOSIS — Z79811 Long term (current) use of aromatase inhibitors: Secondary | ICD-10-CM | POA: Insufficient documentation

## 2020-08-21 DIAGNOSIS — C50411 Malignant neoplasm of upper-outer quadrant of right female breast: Secondary | ICD-10-CM | POA: Insufficient documentation

## 2020-08-21 DIAGNOSIS — G9529 Other cord compression: Secondary | ICD-10-CM

## 2020-08-21 DIAGNOSIS — C7951 Secondary malignant neoplasm of bone: Secondary | ICD-10-CM | POA: Diagnosis not present

## 2020-08-21 DIAGNOSIS — Z79899 Other long term (current) drug therapy: Secondary | ICD-10-CM | POA: Diagnosis not present

## 2020-08-21 DIAGNOSIS — Z5112 Encounter for antineoplastic immunotherapy: Secondary | ICD-10-CM | POA: Insufficient documentation

## 2020-08-21 DIAGNOSIS — C7931 Secondary malignant neoplasm of brain: Secondary | ICD-10-CM | POA: Insufficient documentation

## 2020-08-21 DIAGNOSIS — C50911 Malignant neoplasm of unspecified site of right female breast: Secondary | ICD-10-CM

## 2020-08-21 DIAGNOSIS — Z9221 Personal history of antineoplastic chemotherapy: Secondary | ICD-10-CM | POA: Diagnosis not present

## 2020-08-21 DIAGNOSIS — Z923 Personal history of irradiation: Secondary | ICD-10-CM | POA: Insufficient documentation

## 2020-08-21 LAB — CBC WITH DIFFERENTIAL (CANCER CENTER ONLY)
Abs Immature Granulocytes: 0.01 10*3/uL (ref 0.00–0.07)
Basophils Absolute: 0 10*3/uL (ref 0.0–0.1)
Basophils Relative: 1 %
Eosinophils Absolute: 0.1 10*3/uL (ref 0.0–0.5)
Eosinophils Relative: 3 %
HCT: 38 % (ref 36.0–46.0)
Hemoglobin: 12.9 g/dL (ref 12.0–15.0)
Immature Granulocytes: 0 %
Lymphocytes Relative: 13 %
Lymphs Abs: 0.6 10*3/uL — ABNORMAL LOW (ref 0.7–4.0)
MCH: 33.1 pg (ref 26.0–34.0)
MCHC: 33.9 g/dL (ref 30.0–36.0)
MCV: 97.4 fL (ref 80.0–100.0)
Monocytes Absolute: 0.5 10*3/uL (ref 0.1–1.0)
Monocytes Relative: 12 %
Neutro Abs: 3.1 10*3/uL (ref 1.7–7.7)
Neutrophils Relative %: 71 %
Platelet Count: 115 10*3/uL — ABNORMAL LOW (ref 150–400)
RBC: 3.9 MIL/uL (ref 3.87–5.11)
RDW: 18.7 % — ABNORMAL HIGH (ref 11.5–15.5)
WBC Count: 4.3 10*3/uL (ref 4.0–10.5)
nRBC: 0 % (ref 0.0–0.2)

## 2020-08-21 LAB — CMP (CANCER CENTER ONLY)
ALT: 14 U/L (ref 0–44)
AST: 36 U/L (ref 15–41)
Albumin: 4 g/dL (ref 3.5–5.0)
Alkaline Phosphatase: 177 U/L — ABNORMAL HIGH (ref 38–126)
Anion gap: 11 (ref 5–15)
BUN: 12 mg/dL (ref 8–23)
CO2: 26 mmol/L (ref 22–32)
Calcium: 10.9 mg/dL — ABNORMAL HIGH (ref 8.9–10.3)
Chloride: 102 mmol/L (ref 98–111)
Creatinine: 1.05 mg/dL — ABNORMAL HIGH (ref 0.44–1.00)
GFR, Estimated: 56 mL/min — ABNORMAL LOW (ref >60)
Glucose, Bld: 93 mg/dL (ref 70–99)
Potassium: 3.9 mmol/L (ref 3.5–5.1)
Sodium: 139 mmol/L (ref 135–145)
Total Bilirubin: 1.6 mg/dL — ABNORMAL HIGH (ref 0.3–1.2)
Total Protein: 7.4 g/dL (ref 6.5–8.1)

## 2020-08-21 MED ORDER — ACETAMINOPHEN 325 MG PO TABS
ORAL_TABLET | ORAL | Status: AC
  Filled 2020-08-21: qty 2

## 2020-08-21 MED ORDER — ZOLEDRONIC ACID 4 MG/100ML IV SOLN
4.0000 mg | Freq: Once | INTRAVENOUS | Status: AC
  Administered 2020-08-21: 4 mg via INTRAVENOUS

## 2020-08-21 MED ORDER — ZOLEDRONIC ACID 4 MG/100ML IV SOLN
INTRAVENOUS | Status: AC
  Filled 2020-08-21: qty 100

## 2020-08-21 MED ORDER — SODIUM CHLORIDE 0.9 % IV SOLN
Freq: Once | INTRAVENOUS | Status: AC
  Filled 2020-08-21: qty 250

## 2020-08-21 MED ORDER — CYANOCOBALAMIN 1000 MCG/ML IJ SOLN
1000.0000 ug | INTRAMUSCULAR | Status: DC
  Administered 2020-08-21: 1000 ug via INTRAMUSCULAR

## 2020-08-21 MED ORDER — CYANOCOBALAMIN 1000 MCG/ML IJ SOLN
INTRAMUSCULAR | Status: AC
  Filled 2020-08-21: qty 1

## 2020-08-21 MED ORDER — SODIUM CHLORIDE 0.9% FLUSH
10.0000 mL | Freq: Once | INTRAVENOUS | Status: AC
  Administered 2020-08-21: 10 mL
  Filled 2020-08-21: qty 10

## 2020-08-21 MED ORDER — SODIUM CHLORIDE 0.9% FLUSH
10.0000 mL | INTRAVENOUS | Status: DC | PRN
  Administered 2020-08-21: 10 mL
  Filled 2020-08-21: qty 10

## 2020-08-21 MED ORDER — ACETAMINOPHEN 325 MG PO TABS
650.0000 mg | ORAL_TABLET | Freq: Once | ORAL | Status: AC
  Administered 2020-08-21: 650 mg via ORAL

## 2020-08-21 MED ORDER — HEPARIN SOD (PORK) LOCK FLUSH 100 UNIT/ML IV SOLN
500.0000 [IU] | Freq: Once | INTRAVENOUS | Status: AC | PRN
  Administered 2020-08-21: 500 [IU]
  Filled 2020-08-21: qty 5

## 2020-08-21 MED ORDER — TRASTUZUMAB-DKST CHEMO 150 MG IV SOLR
6.0000 mg/kg | Freq: Once | INTRAVENOUS | Status: AC
  Administered 2020-08-21: 336 mg via INTRAVENOUS
  Filled 2020-08-21: qty 16

## 2020-08-21 NOTE — Patient Instructions (Signed)
[facility] Discharge Instructions for Patients Receiving Chemotherapy  Today you received the following chemotherapy agents: trastuzumab.  To help prevent nausea and vomiting after your treatment, we encourage you to take your nausea medication as directed.   If you develop nausea and vomiting that is not controlled by your nausea medication, call the clinic.   BELOW ARE SYMPTOMS THAT SHOULD BE REPORTED IMMEDIATELY:  *FEVER GREATER THAN 100.5 F  *CHILLS WITH OR WITHOUT FEVER  NAUSEA AND VOMITING THAT IS NOT CONTROLLED WITH YOUR NAUSEA MEDICATION  *UNUSUAL SHORTNESS OF BREATH  *UNUSUAL BRUISING OR BLEEDING  TENDERNESS IN MOUTH AND THROAT WITH OR WITHOUT PRESENCE OF ULCERS  *URINARY PROBLEMS  *BOWEL PROBLEMS  UNUSUAL RASH Items with * indicate a potential emergency and should be followed up as soon as possible.  Feel free to call the clinic should you have any questions or concerns. The clinic phone number is [phone].  Please show the [place] at check-in to the Emergency Department and triage nurse.  Zoledronic Acid Injection (Hypercalcemia, Oncology) What is this medicine? ZOLEDRONIC ACID (ZOE le dron ik AS id) slows calcium loss from bones. It high calcium levels in the blood from some kinds of cancer. It may be used in other people at risk for bone loss. This medicine may be used for other purposes; ask your health care provider or pharmacist if you have questions. COMMON BRAND NAME(S): Zometa What should I tell my health care provider before I take this medicine? They need to know if you have any of these conditions:  cancer  dehydration  dental disease  kidney disease  liver disease  low levels of calcium in the blood  lung or breathing disease (asthma)  receiving steroids like dexamethasone or prednisone  an unusual or allergic reaction to zoledronic acid, other medicines, foods, dyes, or preservatives  pregnant or trying  to get pregnant  breast-feeding How should I use this medicine? This drug is injected into a vein. It is given by a health care provider in a hospital or clinic setting. Talk to your health care provider about the use of this drug in children. Special care may be needed. Overdosage: If you think you have taken too much of this medicine contact a poison control center or emergency room at once. NOTE: This medicine is only for you. Do not share this medicine with others. What if I miss a dose? Keep appointments for follow-up doses. It is important not to miss your dose. Call your health care provider if you are unable to keep an appointment. What may interact with this medicine?  certain antibiotics given by injection  NSAIDs, medicines for pain and inflammation, like ibuprofen or naproxen  some diuretics like bumetanide, furosemide  teriparatide  thalidomide This list may not describe all possible interactions. Give your health care provider a list of all the medicines, herbs, non-prescription drugs, or dietary supplements you use. Also tell them if you smoke, drink alcohol, or use illegal drugs. Some items may interact with your medicine. What should I watch for while using this medicine? Visit your health care provider for regular checks on your progress. It may be some time before you see the benefit from this drug. Some people who take this drug have severe bone, joint, or muscle pain. This drug may also increase your risk for jaw problems or a broken thigh bone. Tell your health care provider right away if you have severe pain in your jaw, bones, joints,  or muscles. Tell you health care provider if you have any pain that does not go away or that gets worse. Tell your dentist and dental surgeon that you are taking this drug. You should not have major dental surgery while on this drug. See your dentist to have a dental exam and fix any dental problems before starting this drug. Take good  care of your teeth while on this drug. Make sure you see your dentist for regular follow-up appointments. You should make sure you get enough calcium and vitamin D while you are taking this drug. Discuss the foods you eat and the vitamins you take with your health care provider. Check with your health care provider if you have severe diarrhea, nausea, and vomiting, or if you sweat a lot. The loss of too much body fluid may make it dangerous for you to take this drug. You may need blood work done while you are taking this drug. Do not become pregnant while taking this drug. Women should inform their health care provider if they wish to become pregnant or think they might be pregnant. There is potential for serious harm to an unborn child. Talk to your health care provider for more information. What side effects may I notice from receiving this medicine? Side effects that you should report to your doctor or health care provider as soon as possible:  allergic reactions (skin rash, itching or hives; swelling of the face, lips, or tongue)  bone pain  infection (fever, chills, cough, sore throat, pain or trouble passing urine)  jaw pain, especially after dental work  joint pain  kidney injury (trouble passing urine or change in the amount of urine)  low blood pressure (dizziness; feeling faint or lightheaded, falls; unusually weak or tired)  low calcium levels (fast heartbeat; muscle cramps or pain; pain, tingling, or numbness in the hands or feet; seizures)  low magnesium levels (fast, irregular heartbeat; muscle cramp or pain; muscle weakness; tremors; seizures)  low red blood cell counts (trouble breathing; feeling faint; lightheaded, falls; unusually weak or tired)  muscle pain  redness, blistering, peeling, or loosening of the skin, including inside the mouth  severe diarrhea  swelling of the ankles, feet, hands  trouble breathing Side effects that usually do not require medical  attention (report to your doctor or health care provider if they continue or are bothersome):  anxious  constipation  coughing  depressed mood  eye irritation, itching, or pain  fever  general ill feeling or flu-like symptoms  nausea  pain, redness, or irritation at site where injected  trouble sleeping This list may not describe all possible side effects. Call your doctor for medical advice about side effects. You may report side effects to FDA at 1-800-FDA-1088. Where should I keep my medicine? This drug is given in a hospital or clinic. It will not be stored at home. NOTE: This sheet is a summary. It may not cover all possible information. If you have questions about this medicine, talk to your doctor, pharmacist, or health care provider.  2021 [employer]/Gold Standard (2019-02-07 09:13:00)

## 2020-08-24 ENCOUNTER — Inpatient Hospital Stay: Payer: Medicare Other

## 2020-08-24 ENCOUNTER — Other Ambulatory Visit (HOSPITAL_COMMUNITY): Payer: Self-pay

## 2020-08-25 ENCOUNTER — Other Ambulatory Visit (HOSPITAL_COMMUNITY): Payer: Self-pay

## 2020-08-26 ENCOUNTER — Other Ambulatory Visit: Payer: Self-pay

## 2020-08-26 ENCOUNTER — Telehealth: Payer: Self-pay

## 2020-08-26 ENCOUNTER — Ambulatory Visit
Admission: RE | Admit: 2020-08-26 | Discharge: 2020-08-26 | Disposition: A | Discharge status: Home / Self Care | Payer: Medicare Other | Source: Ambulatory Visit | Attending: Urology | Admitting: Urology

## 2020-08-26 DIAGNOSIS — Z853 Personal history of malignant neoplasm of breast: Secondary | ICD-10-CM | POA: Diagnosis not present

## 2020-08-26 DIAGNOSIS — C493 Malignant neoplasm of connective and soft tissue of thorax: Secondary | ICD-10-CM | POA: Diagnosis not present

## 2020-08-26 DIAGNOSIS — Z08 Encounter for follow-up examination after completed treatment for malignant neoplasm: Secondary | ICD-10-CM | POA: Diagnosis not present

## 2020-08-26 DIAGNOSIS — I10 Essential (primary) hypertension: Secondary | ICD-10-CM | POA: Diagnosis not present

## 2020-08-26 DIAGNOSIS — C7931 Secondary malignant neoplasm of brain: Secondary | ICD-10-CM

## 2020-08-26 DIAGNOSIS — C7951 Secondary malignant neoplasm of bone: Secondary | ICD-10-CM | POA: Diagnosis not present

## 2020-08-26 DIAGNOSIS — G620 Drug-induced polyneuropathy: Secondary | ICD-10-CM | POA: Diagnosis not present

## 2020-08-26 DIAGNOSIS — C50411 Malignant neoplasm of upper-outer quadrant of right female breast: Secondary | ICD-10-CM | POA: Diagnosis not present

## 2020-08-26 NOTE — Telephone Encounter (Signed)
Called patient to remind of session scheduled for tomorrow.  Gaylyn Rong Counseling Intern

## 2020-08-27 NOTE — Progress Notes (Signed)
[place] Patient and [facility] Counseling Note  The counselor and patient covered a few topics more deeply. The patient's current treatment is not working and she may need a new one. This discussion brought of facing mortality and associated emotions. Discussions also covered the future possibility of ending treatment, as she believes she is passed "the place where I can heal" and what that brought up for her. The counselor provided empathic listening, reflections of emotion, and a safe space to discuss hard topics. The patient was oriented times three and in a thoughtful mood. Her affect was congruent and she showed no signs of SI/HI/NSSI. Thinking "Am I dying?," "What is going to happen to me?" and "Can I plan for the future" has resulted in worry, stress, anxiety, uncertainty and fear. These feelings result in the patient ruminating on the future.The relative health that the patient feels each day correlates to how much or little distress she feels. By engaging in journaling, the patient has been able to reduce distress related to unhelpful thoughts. Next session will summarize treatment and process the end of the counseling relationship.  Gaylyn Rong Counseling Intern

## 2020-09-01 ENCOUNTER — Telehealth: Payer: Self-pay

## 2020-09-01 NOTE — Telephone Encounter (Signed)
Called patient to remind of session tomorrow.  Gaylyn Rong Counseling Intern

## 2020-09-01 NOTE — Assessment & Plan Note (Signed)
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 MRI 02/14/2015: T5 large destructive lesion with pathologic compression fracture and extensive epidural tumor, involvement of T4, excision metastatic carcinoma ER 60%, PR 0%, HER-2 positive ratio 2.71 average copy #7.45 03/27/2015: T4-6 decompression surgery for spinal cord compression PET-CT 12/10/17Rt para-spinal mass mildy hypermetabolic SUV 5.9, lytic cortical lesion 4.8 cm lesion left prox femur suv 8.8 favor osseus met disease Echo 04/12/2017 shows well preserved ejection fraction of 55-60%  Treatment summary: 1. Resumed anastrozole 05/21/2015, but it has caused significant hair loss so we switched her to exemestane 25 mg once daily 07/23/2015 2. Herceptin every 3 week started 04/10/2015 3. Bone metastases: Zometa every 3 months 4. Radiation therapy to the spine (Dr. [name]) 04/13/2015 to 05/19/2015 5. Radiation therapy to left femur 04/28/2016 to 05/12/2016  6. SRS to the brain lesion 11/24/2016 7.Kadcyla started 05/05/2017 discontinued 07/27/2017 8.Palbociclib +Faslodex +trastuzumab+ Pertuzumabstarted 08/10/2017, stopped 04/04/2018 9.XRT to T123/04/2019 10. Enhertu started 06/01/18-12/27/2019 stopped for progression 11SRS to brain January 2021 12.Lapatinib with anastrozole -------------------------------------------------------------------------------------------------------------------------- 05/19/2020: Bone scan: Progressive bone metastatic disease, bilateral ribs, thoracic and lumbar spine, pelvis, sternum, right humeral diaphysis, bilateral femoral, new lesions are seen calvaria, ribs, right humerus, L3 vertebral body and distal left femur  05/19/2020: CT CAP: New lytic bone lesion, similar lesions T12 and  L5, mild increase in the prevascular lymph nodes  CurrentTreatment: Xeloda with Tucatinib XelodaToxicities:Dec appetite, No hand foot syndrome, noticed some papular sores on hands Tucatinib Toxicities:denies diarrhea 1.Nausea and vomiting: Xeloda dose reduction. 2.Hand-foot syndrome: Mild: Moisturizers and decreased dosage of Xeloda 3.Fatigue: Progressively getting worse therefore will reduce Xeloda to 1 tablet in the morning and 1 in the night 4.Leukopenia: Stable 5.Thrombocytopenia: Mild  B12 def: on q 3 week B 12 inj    Hypercalcemia: Patient receivedZometa  Pain issues:Better. Never took fentanyl.  MRI Brain: 08/21/20: DIffuse calvarial and bone mets Bone Scan 08/20/20: Widespread bone mets, increased activity Rt humeral mets, no new bone mets CT CAP 08/20/20: mild prog of lytic bone mets some stable and some inc in size (ribs, T spine and bone pelvis)

## 2020-09-01 NOTE — Progress Notes (Signed)
Patient Care Team: [name], MD as PCP - General (Family Medicine)  DIAGNOSIS:    ICD-10-CM   1. Primary cancer of upper outer quadrant of right female breast ([place])  C50.411     SUMMARY OF ONCOLOGIC HISTORY: Oncology History  Primary cancer of upper outer quadrant of right female breast ([place])  03/08/2012 Initial Diagnosis   Right breast invasive ductal carcinoma ER positive PR negative HER-2 positive Ki-67 44%; another breast mass biopsied in the anterior part of the breast which was also positive for malignancy that was HER-2 negative   03/14/2012 Cancer Staging   Staging form: Breast, AJCC 7th Edition - Pathologic: Stage IV (M1) - Signed by [name], NP on 06/22/2018   05/10/2012 Surgery   Right mastectomy and axillary lymph node dissection: Multifocal disease 5 cm, 1.7 cm, 1.6 cm, ER positive PR negative HER-2 positive Ki-67 44%, 3/7 lymph nodes positive   06/14/2012 - 06/12/2013 Chemotherapy   Adjuvant chemotherapy with Pierce 6 followed by Herceptin maintenance   10/25/2012 - 12/11/2012 Radiation Therapy   Adjuvant radiation therapy   02/07/2013 -  Anti-estrogen oral therapy   Letrozole 2.5 mg daily   02/14/2015 Imaging   MRI spine: Large destructive T5 lesion with severe pathologic compression fracture and extensive epidural tumor, severe spinal stenosis with moderate cord compression, tumor involvement of T4   03/18/2015 PET scan   Residual enhancing soft tissue adjacent to the spinal cord. No evidence of metastatic disease. Nonspecific uptake the left nipple   03/27/2015 - 03/30/2015 Hospital Admission   T4-6 decompression for spinal cord compression (lower extremity paralysis)   04/10/2015 -  Chemotherapy   Palliative treatment with Herceptin every 3 weeks with letrozole 2.5 mg daily   04/13/2015 - 05/19/2015 Radiation Therapy   Palliative radiation treatment to the spine   09/01/2015 Imaging   CT chest abdomen pelvis: Pathologic fracture with posterior  fusion at T5 no other evidence of metastatic disease in the chest abdomen pelvis   12/23/2015 Imaging   CT CAP: new nonspecific 0.6 cm lymph node was stated mediastinum needs follow-up CT, innumerable tiny groundglass pulmonary nodules throughout both lungs unchanged, patchy consolidation from radiation, T5 fracture, no mets   04/15/2016 PET scan   Rt para-spinal mass mildy hypermetabolic SUV 5.9, lytic cortical lesion 4.8 cm lesion left prox femur suv 8.8 favor osseus met disease   04/28/2016 - 05/12/2016 Radiation Therapy   Palliative XRT Left femur   05/24/2016 Procedure   Soft tissue mass biopsy back: Metastatic breast cancer ER 80%, PR 0%, Ki-67 50%, HER-2 positive ratio 2.25   06/09/2016 - 03/29/2017 Anti-estrogen oral therapy   Faslodex with Herceptin and Perjeta every 4 weeks   06/13/2016 - 06/15/2016 Hospital Admission   uncomplicated revision of previous thoracic instrumentation.   10/10/2016 PET scan   Interval development of new hypermetabolic 2.5 x 1.1 cm lesion posterior right eighth rib consistent with metastatic disease, stable right paraspinal lesion at T8, clustered soft tissue nodules in the posterior midline back near cervicothoracic junction smaller than before    11/03/2016 - 11/07/2016 Radiation Therapy   Palliative radiation to right eighth rib   11/04/2016 Imaging   Solitary contrast-enhancing lesion in the right cerebellum 6 x 2 mm concerning for metastatic lesion   11/24/2016 - 11/24/2016 Radiation Therapy   SRS to the brain lesion   04/18/2017 PET scan   New ill-defined rounded airspace opacity in posterior right lower lobe. Differential diagnosis includes malignancy and infectious/inflammatory process.Increased hypermetabolic lytic lesion  in T8 vertebral body, consistent with bone metastasis.  Increased hypermetabolic posterior upper chest wall subcutaneous nodules, highly suspicious metastatic disease. Further improvement in right posterior eighth rib metastasis.    05/04/2017 - 07/27/2017 Chemotherapy   Kadcyla every 3 weeks palliative chemotherapy stopped for progression   07/26/2017 Imaging   Interval increase in size of the multiple enhancing masses posteriorly overlying the spine, interval development of left axillary and mediastinal adenopathy, increase in the size of lytic lesions in T8-T9 and left ninth rib, increased groundglass opacities right lower lobe lung   07/27/2017 - 05/11/2018 Chemotherapy   Palbociclib + Faslodex + trastuzumab + Pertuzumab     04/03/2018 PET scan   PET CT scan showed progression of disease not only in the bones but also in the subcutaneous nodules.   05/15/2018 - 05/25/2018 Radiation Therapy   Palliative radiation to the back   06/01/2018 -  Chemotherapy   Enhertu (Transtuzumab Deruxtecan)    08/07/2018 - 08/07/2018 Radiation Therapy   T12 vertebral body SRS   Carcinoma of right breast, stage 4 ([facility])  02/14/2015 Initial Diagnosis   Breast cancer metastasized to bone, right ([place])   06/01/2018 - 02/07/2020 Chemotherapy   The patient had palonosetron (ALOXI) injection 0.25 mg, 0.25 mg, Intravenous,  Once, 24 of 30 cycles Administration: 0.25 mg (06/01/2018), 0.25 mg (06/22/2018), 0.25 mg (08/24/2018), 0.25 mg (07/13/2018), 0.25 mg (08/03/2018), 0.25 mg (09/14/2018), 0.25 mg (10/05/2018), 0.25 mg (11/16/2018), 0.25 mg (10/26/2018), 0.25 mg (12/07/2018), 0.25 mg (12/28/2018), 0.25 mg (01/18/2019), 0.25 mg (03/15/2019), 0.25 mg (02/15/2019), 0.25 mg (04/12/2019), 0.25 mg (05/09/2019), 0.25 mg (06/07/2019), 0.25 mg (07/05/2019), 0.25 mg (08/09/2019), 0.25 mg (09/13/2019), 0.25 mg (10/18/2019), 0.25 mg (11/22/2019), 0.25 mg (12/27/2019) fam-trastuzumab deruxtecan-nxki (ENHERTU) 332 mg in dextrose 5 % 100 mL chemo infusion, 5.4 mg/kg = 332 mg, Intravenous,  Once, 24 of 30 cycles Dose modification: 4.4 mg/kg (original dose 5.4 mg/kg, Cycle 7, Reason: Provider Judgment), 3.2 mg/kg (original dose 5.4 mg/kg, Cycle 9, Reason: Dose not tolerated), 150 mg  (original dose 5.4 mg/kg, Cycle 18, Reason: Dose not tolerated) Administration: 332 mg (06/01/2018), 332 mg (06/22/2018), 332 mg (07/13/2018), 332 mg (08/24/2018), 332 mg (08/03/2018), 332 mg (09/14/2018), 272 mg (10/05/2018), 200 mg (11/16/2018), 272 mg (10/26/2018), 200 mg (12/07/2018), 200 mg (12/28/2018), 200 mg (01/18/2019), 200 mg (03/15/2019), 200 mg (02/15/2019), 200 mg (04/12/2019), 200 mg (05/09/2019), 200 mg (06/07/2019), 150 mg (07/05/2019), 150 mg (08/09/2019), 150 mg (09/13/2019), 150 mg (10/18/2019), 150 mg (11/22/2019), 150 mg (12/27/2019)  for chemotherapy treatment.    05/29/2020 -  Chemotherapy      Patient is on Antibody Plan: BREAST TRASTUZUMAB Q21D    Metastatic cancer to spine ([place])  Brain metastasis ([place])  11/18/2016 Initial Diagnosis   Brain metastasis ([place])   05/29/2020 -  Chemotherapy      Patient is on Antibody Plan: BREAST TRASTUZUMAB Q21D      CHIEF COMPLIANT: Follow-up of metastatic breast cancer  INTERVAL HISTORY: Heather [name] is a 75 y.o. with above-mentioned history of metastatic breast cancercurrentlytreatment with Xeloda andTukysaand Herceptin. CT CAP on 08/19/20 showed mild progression of osseous metastatic disease, with some stable lesions, but most increased in size and several rib lesions converted to pathologic fractures. Bone scan on 08/19/20 showed widespread osseous metastatic disease, with increased activity in the right humeral and some skull metastases. Brain MRI on 08/20/20 showed calvarial and osseous metastatic disease with no convincing meningeal metastatic involvement. She presentsto the clinic todayfora toxicity checkand to review her scans.  ALLERGIES:  is allergic to other, acyclovir and related, chlorhexidine, monascus purpureus went yeast, pravastatin sodium, tegaderm ag mesh [silver], zanaflex [tizanidine hcl], codeine, keflex [cephalexin], naprosyn [naproxen], oxycodone, tramadol, and tussin [guaifenesin].  MEDICATIONS:  Current Outpatient  Medications  Medication Sig Dispense Refill  . acetaminophen (TYLENOL) 500 MG tablet Take 500 mg by mouth every 6 (six) hours as needed.    . ALPRAZolam (XANAX) 0.25 MG tablet Take 1 tablet by mouth as needed.    Marland Kitchen BOOSTRIX 5-2.5-18.5 LF-MCG/0.5 injection     . capecitabine (XELODA) 500 MG tablet TAKE 1 TABLET BY MOUTH 2 TIMES DAILY AFTER A MEAL. TAKE 1 TAB IN AM AND 1 TAB IN PM, TAKE FOR 14 DAYS ON, 7 DAYS OFF,EVERY 21 DAYS 28 tablet 6  . Cholecalciferol (VITAMIN D) 2000 UNITS tablet Take 2,000 Units by mouth daily.    . COLLAGEN PO Take by mouth.    . Cranberry 250 MG CAPS Take by mouth.    . famotidine (PEPCID) 40 MG tablet Take 40 mg by mouth daily.    Marland Kitchen gabapentin (NEURONTIN) 600 MG tablet Take 1 tablet (600 mg total) by mouth as directed. Take 1 tablet in the AM and afternoon and 1 1/2 tabs at bedtime 315 tablet 2  . HYDROcodone-acetaminophen (NORCO/VICODIN) 5-325 MG tablet Take 1 tablet by mouth every 6 (six) hours as needed for moderate pain. 30 tablet 0  . lidocaine-prilocaine (EMLA) cream Apply to affected area once 30 g 3  . ondansetron (ZOFRAN) 8 MG tablet Take 1 tablet (8 mg total) by mouth every 8 (eight) hours as needed for nausea or vomiting. 20 tablet 0  . OVER THE COUNTER MEDICATION Place 1 drop into both eyes 2 (two) times daily as needed (dry eyes). Over the counter lubricating eye drops    . polyethylene glycol (MIRALAX / GLYCOLAX) packet Take 17 g by mouth daily as needed.    . prochlorperazine (COMPAZINE) 10 MG tablet Take 1 tablet (10 mg total) by mouth every 6 (six) hours as needed for nausea or vomiting. 60 tablet 3  . spironolactone (ALDACTONE) 25 MG tablet Take 25 mg by mouth daily.    . tucatinib (TUKYSA) 150 MG tablet TAKE 2 TABLETS (300 MG TOTAL) BY MOUTH IN THE MORNING AND AT BEDTIME. TAKE EVERY 12 HRS AT THE SAME TIME EACH DAY WITH OR WITHOUT A MEAL. 120 tablet 6   No current facility-administered medications for this visit.   Facility-Administered Medications  Ordered in Other Visits  Medication Dose Route Frequency Provider Last Rate Last Admin  . cyanocobalamin ((VITAMIN B-12)) injection 1,000 mcg  1,000 mcg Intramuscular Q6 weeks [name], MD   1,000 mcg at 03/19/20 1110  . heparin lock flush 100 unit/mL  500 Units Intracatheter Once [name], MD      . sodium chloride flush (NS) 0.9 % injection 10 mL  10 mL Intracatheter Once [name], MD        PHYSICAL EXAMINATION: ECOG PERFORMANCE STATUS: 2 - Symptomatic, <50% confined to bed  Vitals:   09/02/20 0925  BP: (!) 147/60  Pulse: 77  Resp: 17  Temp: 97.6 F (36.4 C)  SpO2: 100%   Filed Weights   09/02/20 0925  Weight: 119 lb 8 oz (54.2 kg)    LABORATORY DATA:  I have reviewed the data as listed CMP Latest Ref Rng & Units 08/21/2020 07/27/2020 07/10/2020  Glucose 70 - 99 mg/dL 93 102(H) 103(H)  BUN 8 - 23 mg/dL 12 14 16  Creatinine 0.44 - 1.00 mg/dL 1.05(H) 1.18(H) 1.07(H)  Sodium 135 - 145 mmol/L 139 134(L) 134(L)  Potassium 3.5 - 5.1 mmol/L 3.9 3.9 4.2  Chloride 98 - 111 mmol/L 102 101 100  CO2 22 - 32 mmol/L $RemoveB'26 27 25  'egYgMnuK$ Calcium 8.9 - 10.3 mg/dL 10.9(H) 10.0 9.6  Total Protein 6.5 - 8.1 g/dL 7.4 7.5 7.3  Total Bilirubin 0.3 - 1.2 mg/dL 1.6(H) 1.9(H) 1.2  Alkaline Phos 38 - 126 U/L 177(H) 176(H) 267(H)  AST 15 - 41 U/L 36 37 36  ALT 0 - 44 U/L 14 21 33    Lab Results  Component Value Date   WBC 4.3 08/21/2020   HGB 12.9 08/21/2020   HCT 38.0 08/21/2020   MCV 97.4 08/21/2020   PLT 115 (L) 08/21/2020   NEUTROABS 3.1 08/21/2020    ASSESSMENT & PLAN:  Primary cancer of upper outer quadrant of right female breast ([place]) Right breast invasive ductal carcinoma ER positive PR negative HER-2 positive Ki-67 44% multifocal disease 3/7 lymph nodes positive T2 N1 M0 stage IIB status post adjuvant chemotherapy with TCH followed by Herceptin maintenance, adjuvant radiation therapy and Letrozole. MRI 02/14/2015: T5 large destructive lesion with pathologic compression  fracture and extensive epidural tumor, involvement of T4, excision metastatic carcinoma ER 60%, PR 0%, HER-2 positive ratio 2.71 average copy #7.45 03/27/2015: T4-6 decompression surgery for spinal cord compression PET-CT 12/10/17Rt para-spinal mass mildy hypermetabolic SUV 5.9, lytic cortical lesion 4.8 cm lesion left prox femur suv 8.8 favor osseus met disease Echo 04/12/2017 shows well preserved ejection fraction of 55-60%  Treatment summary: 1. Resumed anastrozole 05/21/2015, but it has caused significant hair loss so we switched her to exemestane 25 mg once daily 07/23/2015 2. Herceptin every 3 week started 04/10/2015 3. Bone metastases: Zometa every 3 months 4. Radiation therapy to the spine (Dr. [name]) 04/13/2015 to 05/19/2015 5. Radiation therapy to left femur 04/28/2016 to 05/12/2016  6. SRS to the brain lesion 11/24/2016 7.Kadcyla started 05/05/2017 discontinued 07/27/2017 8.Palbociclib +Faslodex +trastuzumab+ Pertuzumabstarted 08/10/2017, stopped 04/04/2018 9.XRT to T123/04/2019 10. Enhertu started 06/01/18-12/27/2019 stopped for progression 11SRS to brain January 2021 12.Lapatinib with anastrozole -------------------------------------------------------------------------------------------------------------------------- 05/19/2020: Bone scan: Progressive bone metastatic disease, bilateral ribs, thoracic and lumbar spine, pelvis, sternum, right humeral diaphysis, bilateral femoral, new lesions are seen calvaria, ribs, right humerus, L3 vertebral body and distal left femur  05/19/2020: CT CAP: New lytic bone lesion, similar lesions T12 and L5, mild increase in the prevascular lymph nodes  CurrentTreatment: Xeloda with Tucatinib XelodaToxicities:Dec appetite, No hand foot syndrome, noticed some papular sores on hands Tucatinib Toxicities:denies diarrhea 1.Nausea and vomiting: Xeloda dose reduction. 2.Hand-foot syndrome: Mild: Moisturizers and decreased dosage of  Xeloda 3.Fatigue: This is her biggest concern with worsening fatigue.     B12 def: on q 3 week B 12 inj, encouraged her to take oral B12 as well.    Hypercalcemia: Patient receivedZometa.  She will receive it every 3 months. Pain issues:Better. Never took fentanyl.  MRI Brain: 08/21/20: DIffuse calvarial and bone mets Bone Scan 08/20/20: Widespread bone mets, increased activity Rt humeral mets, no new bone mets CT CAP 08/20/20: mild prog of lytic bone mets some stable and some inc in size (ribs, T spine and bone pelvis)  After reviewing all the scan results we determined that the evidence of progression is very mild and that I would not switch her to any other treatment. I strongly believe that she will respond to the current treatment if we wait slightly longer. Return to  clinic every 3 weeks for Herceptin treatment and every 6 weeks follow-up with me.  No orders of the defined types were placed in this encounter.  The patient has a good understanding of the overall plan. she agrees with it. she will call with any problems that may develop before the next visit here.  Total time spent: 30 mins including face to face time and time spent for planning, charting and coordination of care  Rulon Eisenmenger, MD, MPH 09/02/2020  I, Molly Dorshimer, am acting as scribe for Dr. [name].  I have reviewed the above documentation for accuracy and completeness, and I agree with the above.

## 2020-09-02 ENCOUNTER — Inpatient Hospital Stay (HOSPITAL_BASED_OUTPATIENT_CLINIC_OR_DEPARTMENT_OTHER): Payer: Medicare Other | Admitting: Hematology and Oncology

## 2020-09-02 ENCOUNTER — Other Ambulatory Visit: Payer: Self-pay

## 2020-09-02 DIAGNOSIS — C7931 Secondary malignant neoplasm of brain: Secondary | ICD-10-CM | POA: Diagnosis not present

## 2020-09-02 DIAGNOSIS — Z5112 Encounter for antineoplastic immunotherapy: Secondary | ICD-10-CM | POA: Diagnosis not present

## 2020-09-02 DIAGNOSIS — Z170 Estrogen receptor positive status [ER+]: Secondary | ICD-10-CM | POA: Diagnosis not present

## 2020-09-02 DIAGNOSIS — C7951 Secondary malignant neoplasm of bone: Secondary | ICD-10-CM | POA: Diagnosis not present

## 2020-09-02 DIAGNOSIS — C50411 Malignant neoplasm of upper-outer quadrant of right female breast: Secondary | ICD-10-CM | POA: Diagnosis not present

## 2020-09-02 DIAGNOSIS — Z9221 Personal history of antineoplastic chemotherapy: Secondary | ICD-10-CM | POA: Diagnosis not present

## 2020-09-02 NOTE — Progress Notes (Signed)
[facility] and [facility] Counseling Note  The counselor began by asking patient for updates on the previous week. The patient reported being nervous for today's appointment and felt better now that it was over. The patient described having the thought "What's going to be affected next?" and her concerns over the counselor departure. The patient and counselor decided using the chaplain for support as needed would be beneficial to the patient. The counselor provided psychoeducation around how the brain functions and the patient described having to learn the hard lesson that it is okay to feel certain ways. The counselor and patient provided reflections on working together and counselor gave feedback on growth and next steps. The counselor provided empathic listening and normalization of pt experience. The patient was oriented times three and in a mostly nervous mood with a slightly flat affect. She showed no SI/HI/NSSI. Thinking "Am I dying?," "What is going to happen to me?" and "Can I plan for the future" has resulted in worry, stress, anxiety, uncertainty and fear. These feelings result in the patient ruminating on the future.The relative health that the patient feels each day correlates to how much or little distress she feels. By engaging in journaling, the patient has been able to reduce distress related to unhelpful thoughts.    Gaylyn Rong Counseling Intern

## 2020-09-02 NOTE — Progress Notes (Signed)
Radiation Oncology         [phone]) [phone] ________________________________  Name: Heather [name] MRN: [id number]  Date: 08/26/2020  DOB: 1945-08-01  Follow up Visit Note- Conducted via telephone due to current COVID-19 concerns for limiting patient exposure  CC: [name], MD  [name], MD  Diagnosis:   75 y.o. woman with ER positive, PR negative, HER-2 positive metastatic breast cancer with multifocal metastases involving the spine, bony skeleton, lung, and brain..   Interval Since Last Radiation: 5 months 11/1-11/11:   SRS// The right temporal target was treated to 25 Gy in 5 fractions of SRS  01/27/20 - 02/07/20: The two metastatic lesions in the right femur were treated to 30 Gy in 10 fractions.  05/31/19: SRS brain/ The 31m central cerebellar target was treated to a prescription dose of 20 Gy in a single fraction.  08/07/18:  SRS Spine// The targeted metastases in the T12 and L2 vertebral bodies were treated to a prescription dose of 18 Gy in 1 fraction.   05/14/2018 - 05/25/2018:  Chest Wall, Right Upper Posterior subcutaneous lesions / treated to 30 Gy delivered in 10 fractions of 3 Gy  05/18/2017 - 05/31/2017:  The thoracic spine (T7-T9) was treated to 30 Gy in 10 fractions of 3 Gy.  6/27-7/18/18 SRS and palliative treatment: 1.  The rib 8th was treated to 30 Gy in 10 fractions 2.  Right cerebellar 6 mm target was treated using 3 Dynamic Conformal Arcs to a prescription dose of 20 Gy in one fraction  04/27/16-05/12/16:  Left femur/ 30 Gy in 10 fractions   04/13/2015-05/19/2015 Postop:  55 Gy in 25 fractions of 2.2 Gy to the thoracic spine   10/25/12-12/11/12: 50.4 Gy to the right chest wall and regional lymph nodes in 28 fractions with a 10 Gy boost to the right chest wall in 1 session.  Narrative:  In summary, Mrs. CLafoyis a well known patient to our service with a history of metastatic breast cancer originally treated in 2014 to the chest wall and regional lymph nodes. She  developed recurrent disease within the thoracic spine and subsequently has undergone 3 surgeries, in addition to radiotherapy, with her most recent surgery being on 06/13/2016 where she had removal of thoracic hardware and replacement of pedicle screws with stabilization.  Of note previously placed screws between T3, T4 T6 and T7 were identified and dissection was carried out inferiorly to T8, T9-T10 and T11, T8 through T11 pedicle screw trajectories were placed bilaterally. She recovered well since treatment to her spine as she had been paralyzed in 2016, and with rigorous therapy has been able to make an impressive recovery. She continues on systemic therapy under the care of Dr. GLindi Adieand is also followed along in the brain and spine oncology conference. PET scan on 10/10/16 revealed a new hypermetabolic 2.5 x 1.1 cm lesion in the posterior right 8th rib. She also mentioned having new onset headaches, dizziness, and an episode of visual change,so an MRI brain was performed 11/04/16 demonstrating a solitary contrast-enhancing lesion within the right cerebellum, measuring 6 x 2 mm and lying just beneath the right tentorial leaflet and concerning for a metastatic lesion.  This solitary lesion was confirmed with SRS protocol MRI brain on 11/16/16 with no new lesions noted. This lesion was treated with SRS on 11/23/16 which she tolerated very well. She also completed palliative radiotherapy to the right eighth rib from 11/02/16 - 11/23/16 with significant improvement in her pain. She remained on  Herceptin, Perjeta, Faslodex and Zometa for systemic disease under the care and direction of [name].  She developed increased pain in the T8 region and also had PET positive findings in this region, as well as hypermetabolic right lower lobe mass concerning for progressive metastatic disease on follow up imaging 04/18/2017, despite systemic treatment. Her systemic therapy was switched to Kadcyla on 05/05/17 with  discontinuation of Faslodex, Herceptin, and Perjeta.  She proceeded with palliative radiotherapy to 30 Gy in 10 fractions to the thoracic spine (T7-T9) from 05/18/2017 - 05/31/2017.  This treatment was tolerated very well.  A repeat brain MRI performed 06/15/17 showed no new or progressive contrast enhancing lesions and no residual abnormality at the site of the previous treated right cerebellar lesion but Chest CT on 07/26/17 showed clear disease progression with interval increase in size of the multiple enhancing masses posteriorly overlying the spine, interval development of left axillary and mediastinal adenopathy, increase in the size of lytic lesions in T8-T9 and left ninth rib, as well as increased groundglass opacities right lower lobe lung. Therefore, she was switched to Palbociclib + Faslodex + trastuzumab + Pertuzumab, started 08/10/17.   MRI T-spine on 09/21/17 showed the known T12 lesion had slightly progressed by a few millimeters from 03/15/17 and there was a new lesion at L2 which was not present on lumbar MRI from 07/13/16. Follow up systemic imaging on 12/13/17, showed a good response to treatment with improved LAN and decreased subcutaneous nodules in the upper thoracic region.  Follow up brain MRI imaging has continued to show interval increase in size the right parietal calvarium metastasis with stable appearance in size of a right frontal calvarial metastasis and no parenchymal or dural metastases. PET scan from 04/03/2018 unfortunately showed significant disease progression despite systemic therapy.  Her systemic chemotherapy was discontinued on 04/04/2018 due to disease progression and she was switched to [facility] (Fam-trastuzumab-Durextecan) q 3 weeks beginning 06/01/18.  Repeat MRI imaging of the brain and thoracic spine was performed on 07/12/2018.  Brain imaging demonstrated 3 skull metastases ranging from 5 to 14 mm in diameter which appeared stable to slightly increased since December 2019 but no  new metastases or brain parenchymal or dural metastases identified.  On thoracic spine imaging there was enlargement of the left paracentral T12 vertebral body metastasis increased from 14 mm in May 2019 to 20 mm currently without associated pathologic fracture and otherwise stable appearance of previously treated lesions in the thoracic spine with no new disease noted.  The posterior subcutaneous nodular metastases appear regressed.  Her imaging studies were reviewed and discussed at the multidisciplinary brain and spine conference on 07/16/2018 and the consensus recommendation was to proceed with stereotactic radiosurgery [facility]) to the progressive disease at T12 which was completed on 08/07/18.  At the time of her CT [facility] for treatment planning, it was also noted that she had an enlarging lesion in the vertebral body of L2 as compared to her PET scan from 03/28/2018 so this lesion was included in the planning and was treated with a single fraction SRS at the time of T12 treatment as well. She tolerated the treatment well and did not experience any ill side effects.  Follow up systemic imaging with PET 10/03/18 showed a good response to treatment with less activity in the recently treated lesions at L2 and T12, no new bony lesions, as well as resolution of subcutaneous nodules in the upper back and pubic area and resolution of right adrenal hypermetabolic mass.  Her  brain MRI from 10/13/18 was also stable with no visible intraparenchymal metastases to the brain and an unchanged appearance of 3 calvarial metastases.  She has longstanding weakness in bilateral LEs, unchanged recently and she associates this with her inactivity due to pronounced fatigue following systemic treatments with ENHERTU.  She reports that it begins to improve approximately 2 weeks after her treatments when she is able to become more mobile but resumes following each treatment every 3 weeks.  She denies any new or increased paraesthesias in the  lower extremities but reports neuropathy in bilateral UEs as well as "nerve pain" in the right axilla that travels down her right side/torso.  She also reports chronic right>left jaw pain and reports that her dentist has told her previously that she had some loss of bone in the right jaw based on xray evaluation.  She is taking Zometa q 3 months and will discuss this with [name].   A follow up MRI brain scan from 05/17/2019 showed a slowly enlarging metastasis at the superior cerebellar vermis measuring 4 mm but without mass-effect or edema.  3 additional known calvarial metastases did not show any growth or progression.  After discussion of these findings, the patient elected to proceed with [place] treatment to the enlarging lesion and this was completed in a single fraction on 05/31/2019. She tolerated the treatment very well, without any ill side effects. Follow up MRI brain scan performed 09/01/19 showed stability of the previously treated lesions and calvarial lesions in the right frontal and parietal regions. There was a slight increase in size (from 1.2 cm to 1.6 cm) of a right temporal calvarial metastasis but no new metastases identified and no parenchymal disease.  She continued ENHERTU systemic therapy every 5 weeks under the care of [name] but this was discontinued in 12/2019 due to disease progression.  She was switched to Lapatinib and anastrazole systemic therapy. When we met for follow up in 01/2020, she mentioned that she had been having some progressive bilateral thigh pain, L>R. The pain would occasionally wake her from sleep at night and was worse with long periods of standing or weightbearing activities. She denied any recent trauma or injury and reported that she has had similar pain in the past and was found to have bony metastasis in the mid left femur at that time, and subsequently treated with palliative radiation in 2018.  The follow up MRI brain scan from 01/02/20 showed 2 new enhancing  skull metastases, measuring 9 mm at the superolateral aspect of the left orbit and 5 mm in the left temporal bone but no evidence of residual parenchymal disease and other skull lesions appeared relatively stable. This scan was reviewed at brain and spine tumor board and consensus recommendation was to continue to monitor the skull lesions on routine follow up scans.   Follow up systemic imaging with CT A/P and Bone scan on 01/01/20 showed stable osseous metastases and small prevascular nodes measuring up to 0.8 cm, suspicious for early nodal recurrence. She met with [name] on 01/02/20 and was switched to Lapatinib and anastrazole systemic therapy.  Xrays of the right and left femurs were performed on 01/15/20 for further evaluation of the progressive pain she was having and did show metastatic lesions in both the right and left femoral shaft, potentially at risk for fracture if left untreated.  Dr. [name] fused her most recent imaging with our prior treatment planning images and did confirm overlap/retreatment of the left-sided lesion in previous  treatment fields. Therefore, the patient proceeded with a 2 week course of palliative radiation to the two metastatic lesions in the right femur, completed 02/07/20 and tolerated well.  She was seen with ENT at [facility] in 02/2020 for evaluation of right sided jaw pain and tenderness with the bone under her right ear and across her right cheek. She said that the pain was intermittent, but she woke up with swelling and more pain than usual which prompted the evaluation with ENT. She was not able to get in with an ENT here, so she got an appointment with someone at [facility] on 02/14/20 Guadalupe Maple, PA-C) to assess the worsening symptoms she was experiencing, as they were affecting her ability to eat. We received a note from ENT questioning whether her symptoms might be associated with the progressive metastatic disease in the right anterior temporal bone with  questionable involvement of the right temporal muscle.  In light of these symptoms, Dr. [name] reviewed the MRI scan from 01/02/20 at tumor board and the recommendation was to proceed with a 5 fraction course of SRS to the right temporal skull metastasis with brain and masticator invasion.  This treatment was completed on March 19, 2020 and tolerated well.  Her follow-up MRI brain from 05/13/20 showed marked progression of osseous metastatic disease throughout the calvarium, skull base and upper cervical spine with progressive bony metastatic disease in the calvarium extending into the lateral orbital rim with encroachment upon the extra conal orbital soft tissues on the left, correlating with the palpable, enlarging eyebrow lesion that she had noted recently. There was no evidence of parenchymal brain metastatic disease or signs of leptomeningeal spread.  She also had repeat systemic imaging with CT C/A/P and bone scan 05/19/20 confirming disease progression with interval development of multiple new lytic bone lesions within the lumbar spine, bony pelvis and proximal femurs, compatible with progression of disease.  This resulted in discontinuation of the Lapatinib and anastrazole systemic therapy and she was switched to Xeloda and Tucatinib which she has been tolerating well.   INTERVAL HISTORY:   I spoke with the patient to conduct her routine scheduled 3 month follow up visit to review the most recent MRI brain scan via telephone to spare the patient unnecessary potential exposure in the healthcare setting during the current COVID-19 pandemic.  The patient was notified in advance and gave permission to proceed with this visit format.   She reports that she has continued doing well overall and has recovered well from the effects of her most recent SRS.  She denies any new headaches, dizziness, N/V or visual changes. Her recent MRI brain scan from 08/20/2020 shows continued progression in size and number of  the extensive calvarial and other osseous enhancing metastatic disease but no definite parenchymal thickening or enhancement and no leptomeningeal enhancement to suggest any meningeal metastatic involvement or parenchymal brain metastasis.  Her recent systemic imaging from 08/19/2020 also supports mild progression of lytic osseous metastatic disease with some lesions stable but some of the widespread lytic lesions increased in size including in the ribs, thoracic spine and bony pelvis.  Several of the previous small lytic rib lesions have now converted to pathologic fractures, likely the culprit for the patient's rib pain.  On her bone scan, there was increased activity within the right humeral metastasis and in some of the skull metastases as compared to her previous study from 05/19/2020.  No obvious new lesions.  On review of systems, the patient reports that she  continues doing well overall.  She continues with moderate fatigue which is tolerable and not significantly interfering with her daily activities aside from the first 2 weeks after systemic treatments.  She has tolerated her systemic therapy fairly well.  She has persistent low grade nausea but no vomiting and is able to eat.  She remains as active as she can be.  She has noted continued improvement in the size of the subcutaneous nodules and in the burning/tingling pain that radiated from the left aspect of the spine underneath her breast since completing radiotherapy. She has not had recent changes in auditory acuity, tinnitus, increased imbalance, tremor or seizure activity. She continues with a palpable, raised "knot" over her left eyebrow that is intermittently sore but does not seem to be enlarging rapidly.  She denies sternal chest pain, shortness of breath, cough, fevers, chills, night sweats, or recent unintended weight changes. She denies any bowel or bladder disturbances, and denies abdominal pain or vomiting currently. She denies any new  musculoskeletal or joint aches or pains and reports that the right hip/thigh pain that she was having has improved greatly since completing the palliative XRT in 02/2020. The pain no longer wakes her from sleep at night and she denies any numbness, tingling or increased focal weakness in the lower extremities. She has continued with pain in the left hip/thigh that is exacerbated with activities but managed with Tylenol prn. She also has chronic, diffuse bony pains that have worsened over the past 2-3 months. She also has chronic weakness bilaterally in the lower extremities, unchanged recently. A complete review of systems is obtained and is otherwise negative.  Past Medical History:  Past Medical History:  Diagnosis Date  . Anemia    hx of  . Anxiety    r/t updated surgery  . Breast cancer ([place])    right  . Cataract    immature-not sure of which eye  . Complication of anesthesia    pt states she is sensitive to meds  . Dizziness    has been going on for 16month;medical MD aware  . Genetic testing 07/11/2016   Test Results: Normal; No pathogenic mutations detected  Genes Analyzed: 43 genes on [employer] Common Cancers panel (APC, ATM, AXIN2, BARD1, BMPR1A, BRCA1, BRCA2, BRIP1, CDH1, CDKN2A, CHEK2, DICER1, EPCAM, GREM1, HOXB13, KIT, MEN1, MLH1, MSH2, MSH6, MUTYH, NBN, NF1, PALB2, PDGFRA, PMS2, POLD1, POLE, PTEN, RAD50, RAD51C, RAD51D, SDHA, SDHB, SDHC, SDHD, SMAD4, SMARCA4, STK11, TP53, TSC1, TSC2, VHL).  .Marland KitchenGERD (gastroesophageal reflux disease)    Tums prn  . H. pylori infection   . H/O hiatal hernia   . Hemorrhoids   . History of UTI   . Hx of radiation therapy 10/25/12- 12/11/12   right chest wall/regional lymph nodes 5040 cGy, 28 sessions, right chest wall boost 1000 cGy 1 session  . Hx: UTI (urinary tract infection)   . Hyperlipidemia    but not on meds;diet and exercise controlled  . Hypertension    recently started Aldactone   . Insomnia    takes Melatoniin daily  . Metastasis to  spinal column (HCC)    T5, Left  Femur cancer- radiation   . Neuropathy    "from back surgery"  . PONV (postoperative nausea and vomiting)    pt experienced hair loss, confusion and combative- 02/15/15  . Right knee pain   . Right shoulder pain   . Skin cancer    squamous, Nodule to back - "same cancer as breast."  .  Urinary frequency    d/t taking Aldactone    Past Surgical History: Past Surgical History:  Procedure Laterality Date  . COLONOSCOPY    . cyst removed from left breast  1970  . DECOMPRESSIVE LUMBAR LAMINECTOMY LEVEL 4 N/A 02/15/2015   Procedure: DECOMPRESSION T5 AND T3-T7 STABALIZATION;  Surgeon: Consuella Lose, MD;  Location: [place];  Service: Neurosurgery;  Laterality: N/A;  . ESOPHAGOGASTRODUODENOSCOPY    . LAMINECTOMY N/A 03/27/2015   Procedure: Thoracic Four-Thoracic Six Laminectomy for tumor;  Surgeon: Consuella Lose, MD;  Location: [place];  Service: Neurosurgery;  Laterality: N/A;  T4-T6 Laminectomy  . left wrist surgery   2004   with plate  . MASTECTOMY MODIFIED RADICAL  05/10/2012   Procedure: MASTECTOMY MODIFIED RADICAL;  Surgeon: Merrie Roof, MD;  Location: [facility];  Service: General;  Laterality: Right;  RIGHT MODIFIED RADICAL MASTECTOMY  . MASTECTOMY, RADICAL Right   . MINOR BREAST BIOPSY Left 05/16/2016   Procedure: EXCISION OF BACK NODULE;  Surgeon: Autumn Messing III, MD;  Location: [facility];  Service: General;  Laterality: Left;  . Nodule  back  05/17/2015  . PORTACATH PLACEMENT  05/10/2012   Procedure: INSERTION PORT-A-CATH;  Surgeon: Merrie Roof, MD;  Location: [facility] OR;  Service: General;  Laterality: Left;    Social History:  Social History   Socioeconomic History  . Marital status: Married    Spouse name: Not on file  . Number of children: 2  . Years of education: Not on file  . Highest education level: Not on file  Occupational History  . Not on file  Tobacco Use  . Smoking status: Never Smoker  . Smokeless  tobacco: Never Used  Vaping Use  . Vaping Use: Never used  Substance and Sexual Activity  . Alcohol use: No  . Drug use: No  . Sexual activity: Yes    Birth control/protection: Post-menopausal  Other Topics Concern  . Not on file  Social History Narrative  . Not on file   Social Determinants of Health   Financial Resource Strain: Not on file  Food Insecurity: Not on file  Transportation Needs: Not on file  Physical Activity: Not on file  Stress: Not on file  Social Connections: Not on file  Intimate Partner Violence: Not on file    Family History: Family History  Problem Relation Age of Onset  . Leukemia Mother 62       deceased 39  . Stroke Father   . Diabetes Mellitus II Father   . Prostate cancer Brother 74       currently 13  . Stomach cancer Maternal Grandmother 36       deceased 80  . Prostate cancer Brother 4       currently 22     ALLERGIES:  is allergic to other, acyclovir and related, chlorhexidine, monascus purpureus went yeast, pravastatin sodium, tegaderm ag mesh [silver], zanaflex [tizanidine hcl], codeine, keflex [cephalexin], naprosyn [naproxen], oxycodone, tramadol, and tussin [guaifenesin].  Meds: Current Outpatient Medications  Medication Sig Dispense Refill  . acetaminophen (TYLENOL) 500 MG tablet Take 500 mg by mouth every 6 (six) hours as needed.    . ALPRAZolam (XANAX) 0.25 MG tablet Take 1 tablet by mouth as needed.    Marland Kitchen BOOSTRIX 5-2.5-18.5 LF-MCG/0.5 injection     . capecitabine (XELODA) 500 MG tablet TAKE 1 TABLET BY MOUTH 2 TIMES DAILY AFTER A MEAL. TAKE 1 TAB IN AM AND 1 TAB IN  PM, TAKE FOR 14 DAYS ON, 7 DAYS OFF,EVERY 21 DAYS 28 tablet 6  . Cholecalciferol (VITAMIN D) 2000 UNITS tablet Take 2,000 Units by mouth daily.    . COLLAGEN PO Take by mouth.    . Cranberry 250 MG CAPS Take by mouth.    . famotidine (PEPCID) 40 MG tablet Take 40 mg by mouth daily.    Marland Kitchen gabapentin (NEURONTIN) 600 MG tablet Take 1 tablet (600 mg total) by mouth  as directed. Take 1 tablet in the AM and afternoon and 1 1/2 tabs at bedtime 315 tablet 2  . HYDROcodone-acetaminophen (NORCO/VICODIN) 5-325 MG tablet Take 1 tablet by mouth every 6 (six) hours as needed for moderate pain. 30 tablet 0  . lidocaine-prilocaine (EMLA) cream Apply to affected area once 30 g 3  . ondansetron (ZOFRAN) 8 MG tablet Take 1 tablet (8 mg total) by mouth every 8 (eight) hours as needed for nausea or vomiting. 20 tablet 0  . OVER THE COUNTER MEDICATION Place 1 drop into both eyes 2 (two) times daily as needed (dry eyes). Over the counter lubricating eye drops    . polyethylene glycol (MIRALAX / GLYCOLAX) packet Take 17 g by mouth daily as needed.    . prochlorperazine (COMPAZINE) 10 MG tablet Take 1 tablet (10 mg total) by mouth every 6 (six) hours as needed for nausea or vomiting. 60 tablet 3  . spironolactone (ALDACTONE) 25 MG tablet Take 25 mg by mouth daily.    . tucatinib (TUKYSA) 150 MG tablet TAKE 2 TABLETS (300 MG TOTAL) BY MOUTH IN THE MORNING AND AT BEDTIME. TAKE EVERY 12 HRS AT THE SAME TIME EACH DAY WITH OR WITHOUT A MEAL. 120 tablet 6   No current facility-administered medications for this encounter.   Facility-Administered Medications Ordered in Other Encounters  Medication Dose Route Frequency Provider Last Rate Last Admin  . cyanocobalamin ((VITAMIN B-12)) injection 1,000 mcg  1,000 mcg Intramuscular Q6 weeks [name], MD   1,000 mcg at 03/19/20 1110  . heparin lock flush 100 unit/mL  500 Units Intracatheter Once [name], MD      . sodium chloride flush (NS) 0.9 % injection 10 mL  10 mL Intracatheter Once [name], MD        Physical Findings:  Unable to assess due to telephone follow-up visit format.   Lab Findings: Lab Results  Component Value Date   WBC 4.3 08/21/2020   HGB 12.9 08/21/2020   HCT 38.0 08/21/2020   MCV 97.4 08/21/2020   PLT 115 (L) 08/21/2020     Radiographic Findings: MR Brain W Wo Contrast  Result Date:  08/21/2020 CLINICAL DATA:  75 year old female with metastatic breast cancer. Progressive bone metastases since August. EXAM: MRI HEAD WITHOUT AND WITH CONTRAST TECHNIQUE: Multiplanar, multiecho pulse sequences of the brain and surrounding structures were obtained without and with intravenous contrast. CONTRAST:  97m MULTIHANCE GADOBENATE DIMEGLUMINE 529 MG/ML IV SOLN COMPARISON:  Brain MRI 05/13/2020 and earlier. FINDINGS: Brain: Cerebral volume is stable and normal for age. No intracranial mass effect or midline shift. No ventriculomegaly. No abnormal brain diffusion identified, no evidence of acute infarct. Despite extensive calvarial and other osseous enhancing metastatic disease (see below), there is no definite pachymeningeal thickening or enhancement (with the most conspicuous area of skull replacement along the anterior inferior left frontal bone and orbit on series 11, image 114. No leptomeningeal enhancement. No abnormal brain parenchymal enhancement or parenchymal metastasis. No cerebral edema identified. Scattered small nonspecific white matter T2  and FLAIR hyperintense foci are stable. No cortical encephalomalacia. No chronic cerebral blood products. Cavernous sinus remains within normal limits. Vascular: Major intracranial vascular flow voids are preserved. The major dural venous sinuses appear to be enhancing and patent on series 12. Skull and upper cervical spine: Diffuse FLAIR hyperintense, enhancing, and hypercellular/DWI positive bone metastases. Innumerable skull lesions. Redemonstrated confluent infiltrative metastasis along the inferolateral left frontal bone involving the left lateral roof and lateral wall of the left orbit. However, mild regression of lateral orbital bone metastasis volume suspected on series 9, image 91 today when compared to January. All visible upper cervical vertebral levels remain affected. Small but increased right mandible bone metastasis on series 2, image 8.  Sinuses/Orbits: No intraorbital mass or mass effect despite the extensive metastasis of the left orbital wall. Globes and intraorbital soft tissues remain normal. Right frontal sinus mucosal thickening or retained secretions again noted. Other: Mastoids remain clear. Visible internal auditory structures appear normal. No significant scalp involvement by skull metastases. IMPRESSION: 1. Despite diffuse calvarial and osseous metastatic disease, there remains no convincing meningeal metastatic involvement. No parenchymal brain metastasis or cerebral edema identified. 2. Diffuse bone metastases. Infiltrative bone metastasis involving the left orbital wall may be mildly regressed since January. Electronically Signed   By: Genevie Ann M.D.   On: 08/21/2020 05:46   NM Bone Scan Whole Body  Result Date: 08/20/2020 CLINICAL DATA:  Follow-up osseous metastatic disease from breast cancer. Patient is currently undergoing treatment with trastuzumab. EXAM: NUCLEAR MEDICINE WHOLE BODY BONE SCAN TECHNIQUE: Whole body anterior and posterior images were obtained approximately 3 hours after intravenous injection of radiopharmaceutical. RADIOPHARMACEUTICALS:  20.8 mCi Technetium-45mMDP IV COMPARISON:  05/19/2020 and earlier. FINDINGS: Numerous foci of increased activity throughout the skeletal indicating metastatic disease, including the skull, BILATERAL ribs, numerous thoracic and lumbar vertebrae, the sternum, the pelvis, the RIGHT humerus and both femurs. There is also residual increased activity in the mid thoracic spine related to the prior fusion. Since the most recent prior examination 05/19/2020, the there is increased activity within the RIGHT humeral lesion and there is increased activity in some of the skull metastases. I do not identify a new osseous metastasis. IMPRESSION: 1. Widespread osseous metastatic disease as present on prior examinations. 2. Increased activity within the RIGHT humeral metastasis and in some of the  skull metastases when compared to the 05/19/2020 examination. No new osseous metastases. Electronically Signed   By: TEvangeline DakinM.D.   On: 08/20/2020 17:04   CT CHEST ABDOMEN PELVIS W CONTRAST  Result Date: 08/20/2020 CLINICAL DATA:  Restaging of metastatic right breast cancer. Herceptin in progress, also prior radiation therapy and chemotherapy. Rib pain under the left breast. EXAM: CT CHEST, ABDOMEN, AND PELVIS WITH CONTRAST TECHNIQUE: Multidetector CT imaging of the chest, abdomen and pelvis was performed following the standard protocol during bolus administration of intravenous contrast. CONTRAST:  1069mOMNIPAQUE IOHEXOL 300 MG/ML  SOLN COMPARISON:  Multiple exams, including 05/19/2020 FINDINGS: CT CHEST FINDINGS Cardiovascular: Right Port-A-Cath tip: Right atrium. Atherosclerotic calcification of the aortic arch and branch vessels. Mediastinum/Nodes: Stable indistinct stranding in the mediastinum. The prevascular adenopathy is substantially improved, a prevascular node formerly measuring 1.2 cm in short axis diameter currently measures 0.4 cm on image 27 series 2. Lungs/Pleura: Stable peripheral bandlike density in the right chest favoring radiation port. Paraspinal reticulation in the right upper lobe and right lower lobe probably also related to radiation therapy. There is some continued indistinct opacity peripherally in the posterior basal  segment and superior segment right lower lobe, no change from previous. A component of this represents scarring and portions of it may be related to radiation therapy as well. Bandlike scarring in the left lower lobe is stable. There is some mild thickening along the left major fissure which is unchanged. No new or progressive lung lesions identified. Musculoskeletal: General progression of lytic metastatic lesions in the ribs and left inferior glenoid, with a variety of lesions substantially larger such as the right posterolateral fourth rib lesion on image 29  series 6 in the left inferior glenoid lesion. Several new pathologic rib fractures are present bilaterally at sites where there was previously smaller lytic lesions. Vertebra plana is again noted at T5 with chronic kyphosis and anterior subluxation of T4 with respect to T6; posterolateral rod and pedicle screw fixation noted with bilateral pedicle screws at T2-T3-T4 and at [id number] without complicating feature, and posterior decompression at T4-T5-T6. Increase conspicuity of some metastatic lesions in the thoracic spine including the T7 vertebral lesion which demonstrates lucency with marginal sclerosis occupying about 2/3 of the anterior vertebral body, and not well seen on the prior exam although there was also some posterior element involvement on the prior exam which is again observed. Other metastatic lesions in the thoracic spine including the T12 level are again noted. Right mastectomy noted. CT ABDOMEN PELVIS FINDINGS Hepatobiliary: Small density along the gallbladder suspicious for small gallstones. No findings of hepatic metastatic disease. No biliary dilatation. Pancreas: Unremarkable Spleen: Upper normal size, otherwise unremarkable. Adrenals/Urinary Tract: Unremarkable Stomach/Bowel: Small type 1 hiatal hernia. Prominent stool throughout the colon favors constipation. Vascular/Lymphatic: Aortoiliac atherosclerotic vascular disease. Reproductive: Unremarkable Other: No supplemental non-categorized findings. Musculoskeletal: Fairly similar pattern of primarily lytic lesions in the lumbar spine and bony pelvis, although some lesions have enlarged. For example, one of several left iliac bone metastatic lesions measures 2.8 by 1.6 cm on image 99 of series 2, previously 2.0 by 1.2 cm. Overall the pattern of bony involvement suggests mild progression. IMPRESSION: 1. Mild progression of lytic osseous metastatic disease with some lesions stable but some of the widespread lytic lesions increased in size  including in the ribs, thoracic spine, and bony pelvis. Several previous small lytic rib lesions have converted to pathologic fractures which may explain the patient's rib pain. 2. Similar appearance of bandlike regions of interstitial accentuation in the lungs, probably from prior radiation therapy. 3. Continued vertebra plana with subluxation and chronic kyphosis at T5, spanned by posterolateral rod and pedicle screw fixation without complicating feature. 4. Other imaging findings of potential clinical significance: Aortic Atherosclerosis (ICD10-I70.0). Cholelithiasis. Prominent stool throughout the colon favors constipation. Small type 1 hiatal hernia. Electronically Signed   By: Van Clines M.D.   On: 08/20/2020 16:53    Impression/Plan:  This visit was conducted via telephone to spare the patient unnecessary potential exposure in the healthcare setting during the current COVID-19 pandemic. 15. 75 yo woman with ER positive breast cancer with metastatic disease to the brain, bones and spine.  She appears to have recovered well from the effects of her recent palliative radiation to the skull and right hip and is currently without complaints.  We reviewed her most recent brain MRI and confirmed that there is no evidence of parenchymal brain metastatic disease or signs of leptomeningeal spread. After a lengthy discussion regarding treatment options, she reports that none of the bony lesions are currently painful enough to warrant palliative radiotherapy.  She prefers to have further discussion with [name]  regarding potential changes in her systemic therapy.  If any of the lesions become painful or symptomatic, she will let us know and we will proceed with treatment planning accordingly at that time.  Otherwise, we discussed the plan to obtain a restaging MRI brain in approximately 3 months to continue to monitor the skull metastases for any evidence of disease progression.  Regarding her systemic  disease, she will continue with routine follow-up for disease management under the care and direction of [name].    2. Spine metastases.  She has recovered well from the effects of her prior SRS treatments to T12 and L2 and is currently without complaints.  She continues to prefer to have this followed with systemic imaging under the care and direction of [name] and only repeat spine MRI if she becomes symptomatic.  We have previously discussed that CT imaging is not the ideal imaging modailty for evaluation of the spine but certainly understand her concerns for limiting the number of imaging studies that she is subjected to on a routine basis.  She understands and accepts the associated risks of missing a very small spinal lesion on CT.  Her recent CT C/A/P imaging from 07/02/19 showed continued disease stability with no new or progressive interval findings. The previously treated metastases at T12 and L2 appear stable and there are no new osseous lesions identified. She is a most reliable patient and will let us know if she becomes symptomatic in the spine so that we can more thoroughly evaluate with MRI if/when necessary.  She appears to have a good understanding of her overall disease and our recommendations and is in agreement with the stated plan.  She knows to call at anytime in the interim with any questions or concerns.  Given current concerns for patient exposure during the COVID-19 pandemic, this encounter was conducted via telephone. The patient was notified in advance and was offered a [place] meeting to allow for face to face communication but unfortunately reported that she did not have the appropriate resources/technology to support such a visit and instead preferred to proceed with telephone consult. The patient has given verbal consent for this type of encounter. The time spent during this encounter was 25 minutes with approximately 50% of that time spent in counseling and coordination  of care. The attendants for this meeting include Natasha Burda PA-C, and patient, Kanae Ignatowski. During the encounter, Quinn Quam PA-C was located at [facility].  Patient, Shantel Helwig was located at home.   Nicholos Johns, PA-C

## 2020-09-03 DIAGNOSIS — I10 Essential (primary) hypertension: Secondary | ICD-10-CM | POA: Diagnosis not present

## 2020-09-07 ENCOUNTER — Telehealth: Payer: Self-pay | Admitting: Hematology and Oncology

## 2020-09-07 NOTE — Telephone Encounter (Signed)
Scheduled per 4/27 los. Called pt and left a msg

## 2020-09-08 ENCOUNTER — Other Ambulatory Visit (HOSPITAL_COMMUNITY): Payer: Self-pay

## 2020-09-08 MED FILL — Capecitabine Tab 500 MG: ORAL | 21 days supply | Qty: 28 | Fill #1 | Status: AC

## 2020-09-10 ENCOUNTER — Other Ambulatory Visit (HOSPITAL_COMMUNITY): Payer: Self-pay

## 2020-09-11 ENCOUNTER — Other Ambulatory Visit: Payer: Self-pay

## 2020-09-11 ENCOUNTER — Inpatient Hospital Stay: Payer: Medicare Other | Attending: Hematology and Oncology

## 2020-09-11 ENCOUNTER — Other Ambulatory Visit: Payer: Self-pay | Admitting: Medical

## 2020-09-11 ENCOUNTER — Ambulatory Visit (HOSPITAL_BASED_OUTPATIENT_CLINIC_OR_DEPARTMENT_OTHER): Payer: Medicare Other | Admitting: Medical

## 2020-09-11 ENCOUNTER — Ambulatory Visit (HOSPITAL_COMMUNITY)
Admission: RE | Admit: 2020-09-11 | Discharge: 2020-09-11 | Disposition: A | Discharge status: Home / Self Care | Payer: Medicare Other | Source: Ambulatory Visit | Attending: Medical | Admitting: Medical

## 2020-09-11 ENCOUNTER — Other Ambulatory Visit (HOSPITAL_COMMUNITY): Payer: Self-pay

## 2020-09-11 VITALS — Wt 120.2 lb

## 2020-09-11 DIAGNOSIS — G9529 Other cord compression: Secondary | ICD-10-CM

## 2020-09-11 DIAGNOSIS — C7931 Secondary malignant neoplasm of brain: Secondary | ICD-10-CM

## 2020-09-11 DIAGNOSIS — C50911 Malignant neoplasm of unspecified site of right female breast: Secondary | ICD-10-CM | POA: Diagnosis not present

## 2020-09-11 DIAGNOSIS — M25511 Pain in right shoulder: Secondary | ICD-10-CM

## 2020-09-11 DIAGNOSIS — Z79899 Other long term (current) drug therapy: Secondary | ICD-10-CM | POA: Insufficient documentation

## 2020-09-11 DIAGNOSIS — Z923 Personal history of irradiation: Secondary | ICD-10-CM | POA: Insufficient documentation

## 2020-09-11 DIAGNOSIS — M79621 Pain in right upper arm: Secondary | ICD-10-CM

## 2020-09-11 DIAGNOSIS — Z170 Estrogen receptor positive status [ER+]: Secondary | ICD-10-CM | POA: Insufficient documentation

## 2020-09-11 DIAGNOSIS — Z79811 Long term (current) use of aromatase inhibitors: Secondary | ICD-10-CM | POA: Insufficient documentation

## 2020-09-11 DIAGNOSIS — C7951 Secondary malignant neoplasm of bone: Secondary | ICD-10-CM

## 2020-09-11 DIAGNOSIS — C50411 Malignant neoplasm of upper-outer quadrant of right female breast: Secondary | ICD-10-CM | POA: Insufficient documentation

## 2020-09-11 DIAGNOSIS — Z9221 Personal history of antineoplastic chemotherapy: Secondary | ICD-10-CM | POA: Insufficient documentation

## 2020-09-11 DIAGNOSIS — Z5112 Encounter for antineoplastic immunotherapy: Secondary | ICD-10-CM | POA: Insufficient documentation

## 2020-09-11 DIAGNOSIS — Z9011 Acquired absence of right breast and nipple: Secondary | ICD-10-CM | POA: Insufficient documentation

## 2020-09-11 DIAGNOSIS — Z981 Arthrodesis status: Secondary | ICD-10-CM | POA: Diagnosis not present

## 2020-09-11 MED ORDER — SODIUM CHLORIDE 0.9% FLUSH
10.0000 mL | INTRAVENOUS | Status: DC | PRN
  Administered 2020-09-11: 10 mL
  Filled 2020-09-11: qty 10

## 2020-09-11 MED ORDER — SODIUM CHLORIDE 0.9 % IV SOLN
Freq: Once | INTRAVENOUS | Status: AC
  Filled 2020-09-11: qty 250

## 2020-09-11 MED ORDER — ACETAMINOPHEN 325 MG PO TABS
650.0000 mg | ORAL_TABLET | Freq: Once | ORAL | Status: AC
  Administered 2020-09-11: 650 mg via ORAL

## 2020-09-11 MED ORDER — CYANOCOBALAMIN 1000 MCG/ML IJ SOLN
INTRAMUSCULAR | Status: AC
  Filled 2020-09-11: qty 1

## 2020-09-11 MED ORDER — TRASTUZUMAB-DKST CHEMO 150 MG IV SOLR
6.0000 mg/kg | Freq: Once | INTRAVENOUS | Status: AC
  Administered 2020-09-11: 336 mg via INTRAVENOUS
  Filled 2020-09-11: qty 16

## 2020-09-11 MED ORDER — ACETAMINOPHEN 325 MG PO TABS
ORAL_TABLET | ORAL | Status: AC
  Filled 2020-09-11: qty 2

## 2020-09-11 MED ORDER — HEPARIN SOD (PORK) LOCK FLUSH 100 UNIT/ML IV SOLN
500.0000 [IU] | Freq: Once | INTRAVENOUS | Status: AC | PRN
  Administered 2020-09-11: 500 [IU]
  Filled 2020-09-11: qty 5

## 2020-09-11 MED ORDER — CYANOCOBALAMIN 1000 MCG/ML IJ SOLN
1000.0000 ug | INTRAMUSCULAR | Status: DC
  Administered 2020-09-11: 1000 ug via INTRAMUSCULAR

## 2020-09-11 NOTE — Progress Notes (Signed)
Pt. complained of right shoulder pain that radiates down the arm. Rates pain 6/10, states she heard some "popping" when she reached to get something. Also complains of right arm pain, and states she feels it is related to her cancer. Pt. states she did not take any pain medication at home. Medicated here with Tylenol. Sandi Mealy, PA notified and in for observation/assessment. Instructed pt. to go to [facility] after this appt. for xray Right shoulder/arm.

## 2020-09-11 NOTE — Patient Instructions (Addendum)
[facility]  Discharge Instructions: Thank you for choosing [facility] to provide your oncology and hematology care.   If you have a lab appointment with the [facility], please go directly to the [facility] and check in at the registration area.   Wear comfortable clothing and clothing appropriate for easy access to any Portacath or PICC line.   We strive to give you quality time with your provider. You may need to reschedule your appointment if you arrive late (15 or more minutes).  Arriving late affects you and other patients whose appointments are after yours.  Also, if you miss three or more appointments without notifying the office, you may be dismissed from the clinic at the provider's discretion.      For prescription refill requests, have your pharmacy contact our office and allow 72 hours for refills to be completed.    Today you received the following chemotherapy and/or immunotherapy agent: Trastuzumab.      To help prevent nausea and vomiting after your treatment, we encourage you to take your nausea medication as directed.  BELOW ARE SYMPTOMS THAT SHOULD BE REPORTED IMMEDIATELY: . *FEVER GREATER THAN 100.4 F (38 C) OR HIGHER . *CHILLS OR SWEATING . *NAUSEA AND VOMITING THAT IS NOT CONTROLLED WITH YOUR NAUSEA MEDICATION . *UNUSUAL SHORTNESS OF BREATH . *UNUSUAL BRUISING OR BLEEDING . *URINARY PROBLEMS (pain or burning when urinating, or frequent urination) . *BOWEL PROBLEMS (unusual diarrhea, constipation, pain near the anus) . TENDERNESS IN MOUTH AND THROAT WITH OR WITHOUT PRESENCE OF ULCERS (sore throat, sores in mouth, or a toothache) . UNUSUAL RASH, SWELLING OR PAIN  . UNUSUAL VAGINAL DISCHARGE OR ITCHING   Items with * indicate a potential emergency and should be followed up as soon as possible or go to the Emergency Department if any problems should occur.  Please show the CHEMOTHERAPY ALERT CARD or IMMUNOTHERAPY  ALERT CARD at check-in to the Emergency Department and triage nurse.  Should you have questions after your visit or need to cancel or reschedule your appointment, please contact [facility]  Dept: [phone]  and follow the prompts.  Office hours are 8:00 a.m. to 4:30 p.m. Monday - Friday. Please note that voicemails left after 4:00 p.m. may not be returned until the following business day.  We are closed weekends and major holidays. You have access to a nurse at all times for urgent questions. Please call the main number to the clinic Dept: [phone] and follow the prompts.   For any non-urgent questions, you may also contact your provider using MyChart. We now offer e-Visits for anyone 72 and older to request care online for non-urgent symptoms. For details visit [email or web address].   Also download the MyChart app! Go to the app store, search "MyChart", open the app, select [facility], and log in with your MyChart username and password.  Due to Covid, a mask is required upon entering the hospital/clinic. If you do not have a mask, one will be given to you upon arrival. For doctor visits, patients may have 1 support person aged 24 or older with them. For treatment visits, patients cannot have anyone with them due to current Covid guidelines and our immunocompromised population.   Cyanocobalamin, Vitamin B12 injection What is this medicine? CYANOCOBALAMIN (sye an oh koe BAL a min) is a man made form of vitamin B12. Vitamin B12 is used in the growth of healthy blood cells, nerve cells, and proteins  in the body. It also helps with the metabolism of fats and carbohydrates. This medicine is used to treat people who can not absorb vitamin B12. This medicine may be used for other purposes; ask your health care provider or pharmacist if you have questions. COMMON BRAND NAME(S): B-12 Compliance Kit, B-12 Injection Kit, Cyomin, LA-12, Nutri-Twelve, Physicians EZ Use  B-12, Primabalt What should I tell my health care provider before I take this medicine? They need to know if you have any of these conditions:  kidney disease  Leber's disease  megaloblastic anemia  an unusual or allergic reaction to cyanocobalamin, cobalt, other medicines, foods, dyes, or preservatives  pregnant or trying to get pregnant  breast-feeding How should I use this medicine? This medicine is injected into a muscle or deeply under the skin. It is usually given by a health care professional in a clinic or doctor's office. However, your doctor may teach you how to inject yourself. Follow all instructions. Talk to your pediatrician regarding the use of this medicine in children. Special care may be needed. Overdosage: If you think you have taken too much of this medicine contact a poison control center or emergency room at once. NOTE: This medicine is only for you. Do not share this medicine with others. What if I miss a dose? If you are given your dose at a clinic or doctor's office, call to reschedule your appointment. If you give your own injections and you miss a dose, take it as soon as you can. If it is almost time for your next dose, take only that dose. Do not take double or extra doses. What may interact with this medicine?  colchicine  heavy alcohol intake This list may not describe all possible interactions. Give your health care provider a list of all the medicines, herbs, non-prescription drugs, or dietary supplements you use. Also tell them if you smoke, drink alcohol, or use illegal drugs. Some items may interact with your medicine. What should I watch for while using this medicine? Visit your doctor or health care professional regularly. You may need blood work done while you are taking this medicine. You may need to follow a special diet. Talk to your doctor. Limit your alcohol intake and avoid smoking to get the best benefit. What side effects may I notice from  receiving this medicine? Side effects that you should report to your doctor or health care professional as soon as possible:  allergic reactions like skin rash, itching or hives, swelling of the face, lips, or tongue  blue tint to skin  chest tightness, pain  difficulty breathing, wheezing  dizziness  red, swollen painful area on the leg Side effects that usually do not require medical attention (report to your doctor or health care professional if they continue or are bothersome):  diarrhea  headache This list may not describe all possible side effects. Call your doctor for medical advice about side effects. You may report side effects to FDA at 1-800-FDA-1088. Where should I keep my medicine? Keep out of the reach of children. Store at room temperature between 15 and 30 degrees C (59 and 85 degrees F). Protect from light. Throw away any unused medicine after the expiration date. NOTE: This sheet is a summary. It may not cover all possible information. If you have questions about this medicine, talk to your doctor, pharmacist, or health care provider.  2021 Elsevier/Gold Standard (2007-08-06 22:10:20)

## 2020-09-11 NOTE — Progress Notes (Signed)
These results were called to the patient. A message was left for her. She was told to use the muscle relaxer that she has at home. No fractures were noted.  Sandi Mealy, MHS, PA-C Physician Assistant

## 2020-09-11 NOTE — Progress Notes (Signed)
Heather [name] was seen in the infusion room this morning.  She reported having pain in her bilateral shoulders but reports that she reached for an INR earlier this week with her right hand when she heard a pop in her right shoulder and has now had tenderness in that area and along her right proximal posterior humerus.  Her most recent bone scan was reviewed.  It was noted that she had multiple areas of metastatic disease within her skeletal system including her left shoulder and a new area at her distal right humerus.  She was referred for an x-ray of her shoulder and an x-ray of her humerus which returned showing:   FINDINGS: No fracture or dislocation of the right shoulder. Joint spaces are well preserved.  No fracture or dislocation of the right humerus. There are multiple lytic osseous lesions throughout the humerus, most conspicuously in the mid to distal diaphysis.  Partially imaged port catheter and thoracic fusion hardware.  IMPRESSION: 1.  No fracture or dislocation of the right shoulder.  2. No fracture or dislocation of the right humerus. There are multiple lytic osseous lesions throughout the humerus, most conspicuously in the mid to distal diaphysis.  She was called and a message was left for her.  She was told to take muscle relaxers which he already has at home.  She expressed understanding and agreement with this plan.  Sandi Mealy, MHS, PA-C Physician Assistant

## 2020-09-17 ENCOUNTER — Other Ambulatory Visit: Payer: Self-pay

## 2020-09-17 ENCOUNTER — Ambulatory Visit (HOSPITAL_BASED_OUTPATIENT_CLINIC_OR_DEPARTMENT_OTHER)
Admission: RE | Admit: 2020-09-17 | Discharge: 2020-09-17 | Disposition: A | Discharge status: Home / Self Care | Payer: Medicare Other | Source: Ambulatory Visit | Attending: Internal Medicine | Admitting: Internal Medicine

## 2020-09-17 ENCOUNTER — Ambulatory Visit (HOSPITAL_COMMUNITY)
Admission: RE | Admit: 2020-09-17 | Discharge: 2020-09-17 | Disposition: A | Discharge status: Home / Self Care | Payer: Medicare Other | Source: Ambulatory Visit | Attending: Internal Medicine | Admitting: Internal Medicine

## 2020-09-17 ENCOUNTER — Encounter (HOSPITAL_COMMUNITY): Payer: Self-pay | Admitting: Internal Medicine

## 2020-09-17 VITALS — BP 140/80 | HR 83 | Wt 119.2 lb

## 2020-09-17 DIAGNOSIS — I5030 Unspecified diastolic (congestive) heart failure: Secondary | ICD-10-CM | POA: Diagnosis not present

## 2020-09-17 DIAGNOSIS — Z79899 Other long term (current) drug therapy: Secondary | ICD-10-CM | POA: Insufficient documentation

## 2020-09-17 DIAGNOSIS — C50911 Malignant neoplasm of unspecified site of right female breast: Secondary | ICD-10-CM

## 2020-09-17 DIAGNOSIS — Z888 Allergy status to other drugs, medicaments and biological substances status: Secondary | ICD-10-CM | POA: Insufficient documentation

## 2020-09-17 DIAGNOSIS — I10 Essential (primary) hypertension: Secondary | ICD-10-CM | POA: Diagnosis not present

## 2020-09-17 DIAGNOSIS — Z9011 Acquired absence of right breast and nipple: Secondary | ICD-10-CM | POA: Diagnosis not present

## 2020-09-17 DIAGNOSIS — C7951 Secondary malignant neoplasm of bone: Secondary | ICD-10-CM | POA: Insufficient documentation

## 2020-09-17 DIAGNOSIS — Z881 Allergy status to other antibiotic agents status: Secondary | ICD-10-CM | POA: Diagnosis not present

## 2020-09-17 DIAGNOSIS — I509 Heart failure, unspecified: Secondary | ICD-10-CM | POA: Diagnosis not present

## 2020-09-17 DIAGNOSIS — Z885 Allergy status to narcotic agent status: Secondary | ICD-10-CM | POA: Diagnosis not present

## 2020-09-17 DIAGNOSIS — Z886 Allergy status to analgesic agent status: Secondary | ICD-10-CM | POA: Diagnosis not present

## 2020-09-17 DIAGNOSIS — I110 Hypertensive heart disease with heart failure: Secondary | ICD-10-CM | POA: Diagnosis not present

## 2020-09-17 LAB — ECHOCARDIOGRAM COMPLETE
Area-P 1/2: 2.37 cm2
S' Lateral: 2.5 cm
Weight: 1907.2 oz

## 2020-09-17 NOTE — Progress Notes (Signed)
  Echocardiogram 2D Echocardiogram has been performed.  Heather [name] 09/17/2020, 1:16 PM

## 2020-09-17 NOTE — Progress Notes (Signed)
Cardio-Oncology Clinic Note   Date:  09/17/2020   ID:  Heather [name], DOB [birth year], MRN [id number]  Location: Home  Provider location:  [facility] Type of Visit: Established patient  PCP:  [name], MD  Cardiologist:  None Primary HF: [place] Oncology: Dr [name]  Chief Complaint: Breast Cancer   History of Present Illness:  Heather [name] is a 75 year old female with metastatic invasive ductal carcinoma in R breast that is ER positive and HER-2/neu positive,  S/P R mastectomy and lymph node dissection 05/10/12.    She has been treated for HTN in the past but stopped taking the medications due to elevated renal function. Intolerant Ace-I due to cough.    She received a total of 6 cycles of Taxotere/carboplatin starting on 06/14/2012. XRT finished in August 2014. She finished initial Herceptin 2015 but unfortunately then developed pathological vertebral fracture with cord compression and paralysis. Now back on Herceptin since 12/16.  Found to have metastatic lesion to left femur. Underwent XRT.   Subsequently found to have a met to her 8th rib. Perjeta added to Herceptin starting in March 2018.    PET scan 03/2018 with progression of mets. Palbociclib +Faslodex +trastuzumab+ Pertuzumabstarted 08/10/2017, stopped 04/04/2018. Enhertu (Fam-trastuzumab-Durextecan) started 06/01/18 stopped due to progression.   Repeat scan 05/19/20:  1. Interval development of multiple new lytic bone lesions within the axial and proximal appendicular skeleton.  05/2020 Enhertu stopped-->Started Xeloda, Herceptin with Tucatinib   She is here for follow-up. Treatment switched in January due to progression-->Xeloda, Herceptin with Tucatinib. Struggling over the past few weeks with increasing skull lesions and lytic lesions in her right arm. Very limited. Denies SOB.    Echo today 09/17/20 EF 60-65% Stable.    Echo 06/15/2020 EF 60-65% stable. Personally  reviewed  Echo 12/30/19 60-65%  Echo 1/21 EF 60-65% Echo 12/19 EF 55-60% GLS - 16.8%  Echo 6/19 EF 55-60%  GLS -23.5%  Echo 06/15/2020 EF 60-65% GLS 23.5%    Past Medical History:  Diagnosis Date  . Anemia    hx of  . Anxiety    r/t updated surgery  . Breast cancer ([place])    right  . Cataract    immature-not sure of which eye  . Complication of anesthesia    pt states she is sensitive to meds  . Dizziness    has been going on for 98month;medical MD aware  . Genetic testing 07/11/2016   Test Results: Normal; No pathogenic mutations detected  Genes Analyzed: 43 genes on [employer] Common Cancers panel (APC, ATM, AXIN2, BARD1, BMPR1A, BRCA1, BRCA2, BRIP1, CDH1, CDKN2A, CHEK2, DICER1, EPCAM, GREM1, HOXB13, KIT, MEN1, MLH1, MSH2, MSH6, MUTYH, NBN, NF1, PALB2, PDGFRA, PMS2, POLD1, POLE, PTEN, RAD50, RAD51C, RAD51D, SDHA, SDHB, SDHC, SDHD, SMAD4, SMARCA4, STK11, TP53, TSC1, TSC2, VHL).  .Marland KitchenGERD (gastroesophageal reflux disease)    Tums prn  . H. pylori infection   . H/O hiatal hernia   . Hemorrhoids   . History of UTI   . Hx of radiation therapy 10/25/12- 12/11/12   right chest wall/regional lymph nodes 5040 cGy, 28 sessions, right chest wall boost 1000 cGy 1 session  . Hx: UTI (urinary tract infection)   . Hyperlipidemia    but not on meds;diet and exercise controlled  . Hypertension    recently started Aldactone   . Insomnia    takes Melatoniin daily  . Metastasis to spinal column (HCC)    T5, Left  Femur cancer- radiation   .  Neuropathy    "from back surgery"  . PONV (postoperative nausea and vomiting)    pt experienced hair loss, confusion and combative- 02/15/15  . Right knee pain   . Right shoulder pain   . Skin cancer    squamous, Nodule to back - "same cancer as breast."  . Urinary frequency    d/t taking Aldactone   Past Surgical History:  Procedure Laterality Date  . COLONOSCOPY    . cyst removed from left breast  1970  . DECOMPRESSIVE LUMBAR LAMINECTOMY LEVEL 4 N/A  02/15/2015   Procedure: DECOMPRESSION T5 AND T3-T7 STABALIZATION;  Surgeon: Consuella Lose, MD;  Location: [facility];  Service: Neurosurgery;  Laterality: N/A;  . ESOPHAGOGASTRODUODENOSCOPY    . LAMINECTOMY N/A 03/27/2015   Procedure: Thoracic Four-Thoracic Six Laminectomy for tumor;  Surgeon: Consuella Lose, MD;  Location: [facility];  Service: Neurosurgery;  Laterality: N/A;  T4-T6 Laminectomy  . left wrist surgery   2004   with plate  . MASTECTOMY MODIFIED RADICAL  05/10/2012   Procedure: MASTECTOMY MODIFIED RADICAL;  Surgeon: Merrie Roof, MD;  Location: [facility];  Service: General;  Laterality: Right;  RIGHT MODIFIED RADICAL MASTECTOMY  . MASTECTOMY, RADICAL Right   . MINOR BREAST BIOPSY Left 05/16/2016   Procedure: EXCISION OF BACK NODULE;  Surgeon: Autumn Messing III, MD;  Location: [facility];  Service: General;  Laterality: Left;  . Nodule  back  05/17/2015  . PORTACATH PLACEMENT  05/10/2012   Procedure: INSERTION PORT-A-CATH;  Surgeon: Merrie Roof, MD;  Location: [facility] OR;  Service: General;  Laterality: Left;     Current Outpatient Medications  Medication Sig Dispense Refill  . acetaminophen (TYLENOL) 500 MG tablet Take 500 mg by mouth every 6 (six) hours as needed.    . ALPRAZolam (XANAX) 0.25 MG tablet Take 1 tablet by mouth as needed.    Marland Kitchen BOOSTRIX 5-2.5-18.5 LF-MCG/0.5 injection     . capecitabine (XELODA) 500 MG tablet TAKE 1 TABLET BY MOUTH 2 TIMES DAILY AFTER A MEAL. TAKE 1 TAB IN AM AND 1 TAB IN PM, TAKE FOR 14 DAYS ON, 7 DAYS OFF,EVERY 21 DAYS 28 tablet 6  . Cholecalciferol (VITAMIN D) 2000 UNITS tablet Take 2,000 Units by mouth daily.    . COLLAGEN PO Take by mouth.    . Cranberry 250 MG CAPS Take by mouth.    . famotidine (PEPCID) 40 MG tablet Take 40 mg by mouth daily.    Marland Kitchen gabapentin (NEURONTIN) 600 MG tablet Take 600 mg by mouth 2 (two) times daily.    Marland Kitchen HYDROcodone-acetaminophen (NORCO/VICODIN) 5-325 MG tablet Take 1 tablet by mouth every 6 (six)  hours as needed for moderate pain. 30 tablet 0  . lidocaine-prilocaine (EMLA) cream Apply to affected area once 30 g 3  . ondansetron (ZOFRAN) 8 MG tablet Take 1 tablet (8 mg total) by mouth every 8 (eight) hours as needed for nausea or vomiting. 20 tablet 0  . OVER THE COUNTER MEDICATION Place 1 drop into both eyes 2 (two) times daily as needed (dry eyes). Over the counter lubricating eye drops    . polyethylene glycol (MIRALAX / GLYCOLAX) packet Take 17 g by mouth daily as needed.    . prochlorperazine (COMPAZINE) 10 MG tablet Take 1 tablet (10 mg total) by mouth every 6 (six) hours as needed for nausea or vomiting. 60 tablet 3  . spironolactone (ALDACTONE) 25 MG tablet Take 25 mg by mouth daily.    Marland Kitchen  tucatinib (TUKYSA) 150 MG tablet TAKE 2 TABLETS (300 MG TOTAL) BY MOUTH IN THE MORNING AND AT BEDTIME. TAKE EVERY 12 HRS AT THE SAME TIME EACH DAY WITH OR WITHOUT A MEAL. 120 tablet 6   No current facility-administered medications for this encounter.   Facility-Administered Medications Ordered in Other Encounters  Medication Dose Route Frequency Provider Last Rate Last Admin  . cyanocobalamin ((VITAMIN B-12)) injection 1,000 mcg  1,000 mcg Intramuscular Q6 weeks [name], MD   1,000 mcg at 03/19/20 1110  . heparin lock flush 100 unit/mL  500 Units Intracatheter Once [name], MD      . sodium chloride flush (NS) 0.9 % injection 10 mL  10 mL Intracatheter Once [name], MD        Allergies:   Other, Acyclovir and related, Chlorhexidine, Monascus purpureus went yeast, Pravastatin sodium, Tegaderm ag mesh [silver], Zanaflex [tizanidine hcl], Codeine, Keflex [cephalexin], Naprosyn [naproxen], Oxycodone, Tramadol, and Tussin [guaifenesin]   Social History:  The patient  reports that she has never smoked. She has never used smokeless tobacco. She reports that she does not drink alcohol and does not use drugs.   Family History:  The patient's family history includes Diabetes Mellitus II  in her father; Leukemia (age of onset: 84) in her mother; Prostate cancer (age of onset: 68) in her brother; Prostate cancer (age of onset: 65) in her brother; Stomach cancer (age of onset: 7) in her maternal grandmother; Stroke in her father.   ROS:  Please see the history of present illness.   All other systems are personally reviewed and negative.   Vitals:   09/17/20 1119  BP: 140/80  Pulse: 83  SpO2: 99%    Exam:  General:  Weak appearing. No resp difficulty HEENT: normal Neck: supple. no JVD. Carotids 2+ bilat; no bruits. No lymphadenopathy or thryomegaly appreciated. Cor: PMI nondisplaced. Regular rate & rhythm. No rubs, gallops or murmurs. Lungs: clear Abdomen: soft, nontender, nondistended. No hepatosplenomegaly. No bruits or masses. Good bowel sounds. Extremities: no cyanosis, clubbing, rash, edema Neuro: alert & orientedx3, cranial nerves grossly intact. moves all 4 extremities w/o difficulty. Affect pleasant  Recent Labs: 08/21/2020: ALT 14; BUN 12; Creatinine 1.05; Hemoglobin 12.9; Platelet Count 115; Potassium 3.9; Sodium 139  Personally reviewed   Wt Readings from Last 3 Encounters:  09/17/20 54.1 kg (119 lb 3.2 oz)  09/11/20 54.5 kg (120 lb 4 oz)  09/02/20 54.2 kg (119 lb 8 oz)      ASSESSMENT AND PLAN:  1. R breast CA with widely metastatic disease  - initial Herceptin completed in 2015. - Palliative Herceptin start 12/16. Added Perjeta in 3/18 - Started Andria Frames early 2019  - PET scan 03/2018 with progression of mets.Enhertu stopped due to progression.  - Repeat scans 1/22 with progression of disease.  Now switched to xeloda+ herceptin with tucatinib.  - Echo 12/30/19 60-65%  - ECHO 06/15/2020 EF 60-65% stable.  - Echo today 09/17/20 EF 60-65% Stable.  - Continue Herceptin for now F/u 6 months.   2. HTN:  - BP stable   Signed, Glori Bickers, MD  09/17/2020 11:38 AM  [facility] [place]  [place] [address] [phone] (office) [phone] (fax)

## 2020-09-17 NOTE — Patient Instructions (Signed)
Please call our office in October to schedule your follow up appointment and echocardiogram  If you have any questions or concerns before your next appointment please send [place] a message through [place] or call our office at [phone].    TO LEAVE A MESSAGE FOR THE NURSE SELECT OPTION 2, PLEASE LEAVE A MESSAGE INCLUDING: . YOUR NAME . DATE OF BIRTH . CALL BACK NUMBER . REASON FOR CALL**this is important as we prioritize the call backs  [place] AS LONG AS YOU CALL BEFORE 4:00 PM  At the [facility], you and your health needs are our priority. As part of our continuing mission to provide you with exceptional heart care, we have created designated Provider Care Teams. These Care Teams include your primary Cardiologist (physician) and Advanced Practice Providers (APPs- Physician Assistants and Nurse Practitioners) who all work together to provide you with the care you need, when you need it.   You may see any of the following providers on your designated Care Team at your next follow up: Marland Kitchen Dr Glori Bickers . Dr Loralie Champagne . Dr Vickki Muff . Darrick Grinder, NP . Lyda Jester, [place] . Audry Riles, PharmD   Please be sure to bring in all your medications bottles to every appointment.

## 2020-09-18 ENCOUNTER — Other Ambulatory Visit (HOSPITAL_COMMUNITY): Payer: Self-pay

## 2020-09-23 ENCOUNTER — Other Ambulatory Visit (HOSPITAL_COMMUNITY): Payer: Self-pay

## 2020-09-29 ENCOUNTER — Other Ambulatory Visit (HOSPITAL_COMMUNITY): Payer: Self-pay

## 2020-09-30 ENCOUNTER — Other Ambulatory Visit (HOSPITAL_COMMUNITY): Payer: Self-pay

## 2020-10-02 ENCOUNTER — Other Ambulatory Visit (HOSPITAL_COMMUNITY): Payer: Self-pay

## 2020-10-02 ENCOUNTER — Inpatient Hospital Stay: Payer: Medicare Other

## 2020-10-02 ENCOUNTER — Other Ambulatory Visit: Payer: Self-pay

## 2020-10-02 ENCOUNTER — Inpatient Hospital Stay (HOSPITAL_BASED_OUTPATIENT_CLINIC_OR_DEPARTMENT_OTHER): Payer: Medicare Other | Admitting: Hematology and Oncology

## 2020-10-02 DIAGNOSIS — Z79811 Long term (current) use of aromatase inhibitors: Secondary | ICD-10-CM | POA: Diagnosis not present

## 2020-10-02 DIAGNOSIS — C7951 Secondary malignant neoplasm of bone: Secondary | ICD-10-CM

## 2020-10-02 DIAGNOSIS — C50411 Malignant neoplasm of upper-outer quadrant of right female breast: Secondary | ICD-10-CM

## 2020-10-02 DIAGNOSIS — Z170 Estrogen receptor positive status [ER+]: Secondary | ICD-10-CM | POA: Diagnosis not present

## 2020-10-02 DIAGNOSIS — C7931 Secondary malignant neoplasm of brain: Secondary | ICD-10-CM

## 2020-10-02 DIAGNOSIS — Z5112 Encounter for antineoplastic immunotherapy: Secondary | ICD-10-CM | POA: Diagnosis not present

## 2020-10-02 DIAGNOSIS — Z95828 Presence of other vascular implants and grafts: Secondary | ICD-10-CM

## 2020-10-02 DIAGNOSIS — C50911 Malignant neoplasm of unspecified site of right female breast: Secondary | ICD-10-CM

## 2020-10-02 DIAGNOSIS — G9529 Other cord compression: Secondary | ICD-10-CM

## 2020-10-02 LAB — CBC WITH DIFFERENTIAL (CANCER CENTER ONLY)
Abs Immature Granulocytes: 0.01 10*3/uL (ref 0.00–0.07)
Basophils Absolute: 0 10*3/uL (ref 0.0–0.1)
Basophils Relative: 1 %
Eosinophils Absolute: 0.1 10*3/uL (ref 0.0–0.5)
Eosinophils Relative: 2 %
HCT: 36.3 % (ref 36.0–46.0)
Hemoglobin: 12.3 g/dL (ref 12.0–15.0)
Immature Granulocytes: 0 %
Lymphocytes Relative: 12 %
Lymphs Abs: 0.5 10*3/uL — ABNORMAL LOW (ref 0.7–4.0)
MCH: 33 pg (ref 26.0–34.0)
MCHC: 33.9 g/dL (ref 30.0–36.0)
MCV: 97.3 fL (ref 80.0–100.0)
Monocytes Absolute: 0.5 10*3/uL (ref 0.1–1.0)
Monocytes Relative: 10 %
Neutro Abs: 3.4 10*3/uL (ref 1.7–7.7)
Neutrophils Relative %: 75 %
Platelet Count: 141 10*3/uL — ABNORMAL LOW (ref 150–400)
RBC: 3.73 MIL/uL — ABNORMAL LOW (ref 3.87–5.11)
RDW: 14.6 % (ref 11.5–15.5)
WBC Count: 4.5 10*3/uL (ref 4.0–10.5)
nRBC: 0 % (ref 0.0–0.2)

## 2020-10-02 LAB — CMP (CANCER CENTER ONLY)
ALT: 19 U/L (ref 0–44)
AST: 51 U/L — ABNORMAL HIGH (ref 15–41)
Albumin: 3.8 g/dL (ref 3.5–5.0)
Alkaline Phosphatase: 193 U/L — ABNORMAL HIGH (ref 38–126)
Anion gap: 9 (ref 5–15)
BUN: 14 mg/dL (ref 8–23)
CO2: 28 mmol/L (ref 22–32)
Calcium: 12.9 mg/dL — ABNORMAL HIGH (ref 8.9–10.3)
Chloride: 102 mmol/L (ref 98–111)
Creatinine: 1.19 mg/dL — ABNORMAL HIGH (ref 0.44–1.00)
GFR, Estimated: 48 mL/min — ABNORMAL LOW (ref >60)
Glucose, Bld: 97 mg/dL (ref 70–99)
Potassium: 3.7 mmol/L (ref 3.5–5.1)
Sodium: 139 mmol/L (ref 135–145)
Total Bilirubin: 1.1 mg/dL (ref 0.3–1.2)
Total Protein: 7.3 g/dL (ref 6.5–8.1)

## 2020-10-02 MED ORDER — ACETAMINOPHEN 325 MG PO TABS
ORAL_TABLET | ORAL | Status: AC
  Filled 2020-10-02: qty 2

## 2020-10-02 MED ORDER — SODIUM CHLORIDE 0.9% FLUSH
10.0000 mL | Freq: Once | INTRAVENOUS | Status: AC
  Administered 2020-10-02: 10 mL
  Filled 2020-10-02: qty 10

## 2020-10-02 MED ORDER — HEPARIN SOD (PORK) LOCK FLUSH 100 UNIT/ML IV SOLN
500.0000 [IU] | Freq: Once | INTRAVENOUS | Status: AC | PRN
  Administered 2020-10-02: 500 [IU]
  Filled 2020-10-02: qty 5

## 2020-10-02 MED ORDER — SODIUM CHLORIDE 0.9% FLUSH
10.0000 mL | INTRAVENOUS | Status: DC | PRN
  Administered 2020-10-02: 10 mL
  Filled 2020-10-02: qty 10

## 2020-10-02 MED ORDER — CYANOCOBALAMIN 1000 MCG/ML IJ SOLN
1000.0000 ug | INTRAMUSCULAR | Status: DC
  Administered 2020-10-02: 1000 ug via INTRAMUSCULAR

## 2020-10-02 MED ORDER — ZOLEDRONIC ACID 4 MG/100ML IV SOLN
INTRAVENOUS | Status: AC
  Filled 2020-10-02: qty 100

## 2020-10-02 MED ORDER — SODIUM CHLORIDE 0.9 % IV SOLN
6.0000 mg/kg | Freq: Once | INTRAVENOUS | Status: AC
  Administered 2020-10-02: 336 mg via INTRAVENOUS
  Filled 2020-10-02: qty 16

## 2020-10-02 MED ORDER — ZOLEDRONIC ACID 4 MG/100ML IV SOLN
4.0000 mg | Freq: Once | INTRAVENOUS | Status: AC
  Administered 2020-10-02: 4 mg via INTRAVENOUS

## 2020-10-02 MED ORDER — ACETAMINOPHEN 325 MG PO TABS: 650.0000 mg | ORAL_TABLET | Freq: Once | ORAL | Status: DC

## 2020-10-02 MED ORDER — SODIUM CHLORIDE 0.9 % IV SOLN
Freq: Once | INTRAVENOUS | Status: AC
  Filled 2020-10-02: qty 250

## 2020-10-02 MED ORDER — PREDNISONE 10 MG PO TABS: 10.0000 mg | ORAL_TABLET | Freq: Every day | ORAL | 1 refills | Status: DC

## 2020-10-02 MED ORDER — CYANOCOBALAMIN 1000 MCG/ML IJ SOLN
INTRAMUSCULAR | Status: AC
  Filled 2020-10-02: qty 1

## 2020-10-02 NOTE — Assessment & Plan Note (Signed)
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 MRI 02/14/2015: T5 large destructive lesion with pathologic compression fracture and extensive epidural tumor, involvement of T4, excision metastatic carcinoma ER 60%, PR 0%, HER-2 positive ratio 2.71 average copy #7.45 03/27/2015: T4-6 decompression surgery for spinal cord compression PET-CT 12/10/17Rt para-spinal mass mildy hypermetabolic SUV 5.9, lytic cortical lesion 4.8 cm lesion left prox femur suv 8.8 favor osseus met disease Echo 04/12/2017 shows well preserved ejection fraction of 55-60%  Treatment summary: 1. Resumed anastrozole 05/21/2015, but it has caused significant hair loss so we switched her to exemestane 25 mg once daily 07/23/2015 2. Herceptin every 3 week started 04/10/2015 3. Bone metastases: Zometa every 3 months 4. Radiation therapy to the spine (Dr. [name]) 04/13/2015 to 05/19/2015 5. Radiation therapy to left femur 04/28/2016 to 05/12/2016  6. SRS to the brain lesion 11/24/2016 7.Kadcyla started 05/05/2017 discontinued 07/27/2017 8.Palbociclib +Faslodex +trastuzumab+ Pertuzumabstarted 08/10/2017, stopped 04/04/2018 9.XRT to T123/04/2019 10. Enhertu started 06/01/18-12/27/2019 stopped for progression 11SRS to brain January 2021 12.Lapatinib with anastrozole -------------------------------------------------------------------------------------------------------------------------- 05/19/2020: Bone scan: Progressive bone metastatic disease, bilateral ribs, thoracic and lumbar spine, pelvis, sternum, right humeral diaphysis, bilateral femoral, new lesions are seen calvaria, ribs, right humerus, L3 vertebral body and distal left femur  05/19/2020: CT CAP: New lytic bone lesion, similar lesions T12 and  L5, mild increase in the prevascular lymph nodes  CurrentTreatment: Xeloda with Tucatinib XelodaToxicities:Dec appetite, No hand foot syndrome, noticed some papular sores on hands Tucatinib Toxicities:denies diarrhea 1.Nausea and vomiting:Xeloda dose reduction. 2.Hand-foot syndrome: Mild: Moisturizers and decreased dosage of Xeloda 3.Fatigue: This is her biggest concern with worsening fatigue.     B12 def: on q 3 week B 12 inj, encouraged her to take oral B12 as well.   Hypercalcemia: Patient receivedZometa.  She will receive it every 3 months. Pain issues:Better. Never took fentanyl.  MRI Brain: 08/21/20: DIffuse calvarial and bone mets Bone Scan 08/20/20: Widespread bone mets, increased activity Rt humeral mets, no new bone mets CT CAP 08/20/20: mild prog of lytic bone mets some stable and some inc in size (ribs, T spine and bone pelvis)  Return to clinic every 3 weeks for Herceptin every 6 weeks to follow-up with me.

## 2020-10-02 NOTE — Progress Notes (Signed)
Patient Care Team: Laurann Montana, MD as PCP - General (Family Medicine)  DIAGNOSIS:  Encounter Diagnosis  Name Primary?  . Primary cancer of upper outer quadrant of right female breast (HCC)     SUMMARY OF ONCOLOGIC HISTORY: Oncology History  Primary cancer of upper outer quadrant of right female breast (HCC)  03/08/2012 Initial Diagnosis   Right breast invasive ductal carcinoma ER positive PR negative HER-2 positive Ki-67 44%; another breast mass biopsied in the anterior part of the breast which was also positive for malignancy that was HER-2 negative   03/14/2012 Cancer Staging   Staging form: Breast, AJCC 7th Edition - Pathologic: Stage IV (M1) - Signed by Loa Socks, NP on 06/22/2018   05/10/2012 Surgery   Right mastectomy and axillary lymph node dissection: Multifocal disease 5 cm, 1.7 cm, 1.6 cm, ER positive PR negative HER-2 positive Ki-67 44%, 3/7 lymph nodes positive   06/14/2012 - 06/12/2013 Chemotherapy   Adjuvant chemotherapy with TCH 6 followed by Herceptin maintenance   10/25/2012 - 12/11/2012 Radiation Therapy   Adjuvant radiation therapy   02/07/2013 -  Anti-estrogen oral therapy   Letrozole 2.5 mg daily   02/14/2015 Imaging   MRI spine: Large destructive T5 lesion with severe pathologic compression fracture and extensive epidural tumor, severe spinal stenosis with moderate cord compression, tumor involvement of T4   03/18/2015 PET scan   Residual enhancing soft tissue adjacent to the spinal cord. No evidence of metastatic disease. Nonspecific uptake the left nipple   03/27/2015 - 03/30/2015 Hospital Admission   T4-6 decompression for spinal cord compression (lower extremity paralysis)   04/10/2015 -  Chemotherapy   Palliative treatment with Herceptin every 3 weeks with letrozole 2.5 mg daily   04/13/2015 - 05/19/2015 Radiation Therapy   Palliative radiation treatment to the spine   09/01/2015 Imaging   CT chest abdomen pelvis: Pathologic fracture with  posterior fusion at T5 no other evidence of metastatic disease in the chest abdomen pelvis   12/23/2015 Imaging   CT CAP: new nonspecific 0.6 cm lymph node was stated mediastinum needs follow-up CT, innumerable tiny groundglass pulmonary nodules throughout both lungs unchanged, patchy consolidation from radiation, T5 fracture, no mets   04/15/2016 PET scan   Rt para-spinal mass mildy hypermetabolic SUV 5.9, lytic cortical lesion 4.8 cm lesion left prox femur suv 8.8 favor osseus met disease   04/28/2016 - 05/12/2016 Radiation Therapy   Palliative XRT Left femur   05/24/2016 Procedure   Soft tissue mass biopsy back: Metastatic breast cancer ER 80%, PR 0%, Ki-67 50%, HER-2 positive ratio 2.25   06/09/2016 - 03/29/2017 Anti-estrogen oral therapy   Faslodex with Herceptin and Perjeta every 4 weeks   06/13/2016 - 06/15/2016 Hospital Admission   uncomplicated revision of previous thoracic instrumentation.   10/10/2016 PET scan   Interval development of new hypermetabolic 2.5 x 1.1 cm lesion posterior right eighth rib consistent with metastatic disease, stable right paraspinal lesion at T8, clustered soft tissue nodules in the posterior midline back near cervicothoracic junction smaller than before    11/03/2016 - 11/07/2016 Radiation Therapy   Palliative radiation to right eighth rib   11/04/2016 Imaging   Solitary contrast-enhancing lesion in the right cerebellum 6 x 2 mm concerning for metastatic lesion   11/24/2016 - 11/24/2016 Radiation Therapy   SRS to the brain lesion   04/18/2017 PET scan   New ill-defined rounded airspace opacity in posterior right lower lobe. Differential diagnosis includes malignancy and infectious/inflammatory process.Increased hypermetabolic lytic lesion in  T8 vertebral body, consistent with bone metastasis.  Increased hypermetabolic posterior upper chest wall subcutaneous nodules, highly suspicious metastatic disease. Further improvement in right posterior eighth rib  metastasis.   05/04/2017 - 07/27/2017 Chemotherapy   Kadcyla every 3 weeks palliative chemotherapy stopped for progression   07/26/2017 Imaging   Interval increase in size of the multiple enhancing masses posteriorly overlying the spine, interval development of left axillary and mediastinal adenopathy, increase in the size of lytic lesions in T8-T9 and left ninth rib, increased groundglass opacities right lower lobe lung   07/27/2017 - 05/11/2018 Chemotherapy   Palbociclib + Faslodex + trastuzumab + Pertuzumab     04/03/2018 PET scan   PET CT scan showed progression of disease not only in the bones but also in the subcutaneous nodules.   05/15/2018 - 05/25/2018 Radiation Therapy   Palliative radiation to the back   06/01/2018 -  Chemotherapy   Enhertu (Transtuzumab Deruxtecan)    08/07/2018 - 08/07/2018 Radiation Therapy   T12 vertebral body SRS   Carcinoma of right breast, stage 4 (HCC)  02/14/2015 Initial Diagnosis   Breast cancer metastasized to bone, right ([place])   06/01/2018 - 02/07/2020 Chemotherapy   The patient had palonosetron (ALOXI) injection 0.25 mg, 0.25 mg, Intravenous,  Once, 24 of 30 cycles Administration: 0.25 mg (06/01/2018), 0.25 mg (06/22/2018), 0.25 mg (08/24/2018), 0.25 mg (07/13/2018), 0.25 mg (08/03/2018), 0.25 mg (09/14/2018), 0.25 mg (10/05/2018), 0.25 mg (11/16/2018), 0.25 mg (10/26/2018), 0.25 mg (12/07/2018), 0.25 mg (12/28/2018), 0.25 mg (01/18/2019), 0.25 mg (03/15/2019), 0.25 mg (02/15/2019), 0.25 mg (04/12/2019), 0.25 mg (05/09/2019), 0.25 mg (06/07/2019), 0.25 mg (07/05/2019), 0.25 mg (08/09/2019), 0.25 mg (09/13/2019), 0.25 mg (10/18/2019), 0.25 mg (11/22/2019), 0.25 mg (12/27/2019) fam-trastuzumab deruxtecan-nxki (ENHERTU) 332 mg in dextrose 5 % 100 mL chemo infusion, 5.4 mg/kg = 332 mg, Intravenous,  Once, 24 of 30 cycles Dose modification: 4.4 mg/kg (original dose 5.4 mg/kg, Cycle 7, Reason: Provider Judgment), 3.2 mg/kg (original dose 5.4 mg/kg, Cycle 9, Reason: Dose not tolerated),  150 mg (original dose 5.4 mg/kg, Cycle 18, Reason: Dose not tolerated) Administration: 332 mg (06/01/2018), 332 mg (06/22/2018), 332 mg (07/13/2018), 332 mg (08/24/2018), 332 mg (08/03/2018), 332 mg (09/14/2018), 272 mg (10/05/2018), 200 mg (11/16/2018), 272 mg (10/26/2018), 200 mg (12/07/2018), 200 mg (12/28/2018), 200 mg (01/18/2019), 200 mg (03/15/2019), 200 mg (02/15/2019), 200 mg (04/12/2019), 200 mg (05/09/2019), 200 mg (06/07/2019), 150 mg (07/05/2019), 150 mg (08/09/2019), 150 mg (09/13/2019), 150 mg (10/18/2019), 150 mg (11/22/2019), 150 mg (12/27/2019)  for chemotherapy treatment.    05/29/2020 -  Chemotherapy      Patient is on Antibody Plan: BREAST TRASTUZUMAB Q21D    Metastatic cancer to spine ([place])  Brain metastasis ([place])  11/18/2016 Initial Diagnosis   Brain metastasis ([place])   05/29/2020 -  Chemotherapy      Patient is on Antibody Plan: BREAST TRASTUZUMAB Q21D      CHIEF COMPLIANT: Herceptin infusion  INTERVAL HISTORY: Heather [name] is a 75 year old with metastatic breast cancer HER2 positive disease who is currently on Xeloda with Tucatinib and Herceptin.  She has no problems tolerating Herceptin.  With Xeloda she developed hand-foot syndrome and we reduce the dosage and she is doing much better with that.  She complains of diffuse bone pain throughout her body especially in her bilateral shoulders and arms as well as her lower back.  She is very concerned that the current treatment is not working and that she may be progressing.  She has a lesion on her left frontal area  of the scalp which she thinks is getting worse.   ALLERGIES:  is allergic to other, acyclovir and related, chlorhexidine, monascus purpureus went yeast, pravastatin sodium, tegaderm ag mesh [silver], zanaflex [tizanidine hcl], codeine, keflex [cephalexin], naprosyn [naproxen], oxycodone, tramadol, and tussin [guaifenesin].  MEDICATIONS:  Current Outpatient Medications  Medication Sig Dispense Refill  . acetaminophen (TYLENOL) 500  MG tablet Take 500 mg by mouth every 6 (six) hours as needed.    . ALPRAZolam (XANAX) 0.25 MG tablet Take 1 tablet by mouth as needed.    Marland Kitchen BOOSTRIX 5-2.5-18.5 LF-MCG/0.5 injection     . capecitabine (XELODA) 500 MG tablet TAKE 1 TABLET BY MOUTH 2 TIMES DAILY AFTER A MEAL. TAKE 1 TAB IN AM AND 1 TAB IN PM, TAKE FOR 14 DAYS ON, 7 DAYS OFF,EVERY 21 DAYS 28 tablet 6  . Cholecalciferol (VITAMIN D) 2000 UNITS tablet Take 2,000 Units by mouth daily.    . COLLAGEN PO Take by mouth.    . Cranberry 250 MG CAPS Take by mouth.    . famotidine (PEPCID) 40 MG tablet Take 40 mg by mouth daily.    Marland Kitchen gabapentin (NEURONTIN) 600 MG tablet Take 600 mg by mouth 2 (two) times daily.    Marland Kitchen HYDROcodone-acetaminophen (NORCO/VICODIN) 5-325 MG tablet Take 1 tablet by mouth every 6 (six) hours as needed for moderate pain. 30 tablet 0  . lidocaine-prilocaine (EMLA) cream Apply to affected area once 30 g 3  . ondansetron (ZOFRAN) 8 MG tablet Take 1 tablet (8 mg total) by mouth every 8 (eight) hours as needed for nausea or vomiting. 20 tablet 0  . OVER THE COUNTER MEDICATION Place 1 drop into both eyes 2 (two) times daily as needed (dry eyes). Over the counter lubricating eye drops    . polyethylene glycol (MIRALAX / GLYCOLAX) packet Take 17 g by mouth daily as needed.    . prochlorperazine (COMPAZINE) 10 MG tablet Take 1 tablet (10 mg total) by mouth every 6 (six) hours as needed for nausea or vomiting. 60 tablet 3  . spironolactone (ALDACTONE) 25 MG tablet Take 25 mg by mouth daily.    . tucatinib (TUKYSA) 150 MG tablet TAKE 2 TABLETS (300 MG TOTAL) BY MOUTH IN THE MORNING AND AT BEDTIME. TAKE EVERY 12 HRS AT THE SAME TIME EACH DAY WITH OR WITHOUT A MEAL. 120 tablet 6   No current facility-administered medications for this visit.   Facility-Administered Medications Ordered in Other Visits  Medication Dose Route Frequency Provider Last Rate Last Admin  . cyanocobalamin ((VITAMIN B-12)) injection 1,000 mcg  1,000 mcg  Intramuscular Q6 weeks [name], MD   1,000 mcg at 03/19/20 1110  . heparin lock flush 100 unit/mL  500 Units Intracatheter Once [name], MD      . sodium chloride flush (NS) 0.9 % injection 10 mL  10 mL Intracatheter Once [name], MD        PHYSICAL EXAMINATION: ECOG PERFORMANCE STATUS: 2 - Symptomatic, <50% confined to bed  Vitals:   10/02/20 1002  BP: (!) 134/55  Pulse: 80  Resp: 17  Temp: 97.7 F (36.5 C)  SpO2: 98%   Filed Weights   10/02/20 1002  Weight: 117 lb 4.8 oz (53.2 kg)      LABORATORY DATA:  I have reviewed the data as listed CMP Latest Ref Rng & Units 10/02/2020 08/21/2020 07/27/2020  Glucose 70 - 99 mg/dL 97 93 102(H)  BUN 8 - 23 mg/dL _0 Creatinine 0.44 -  1.00 mg/dL 1.19(H) 1.05(H) 1.18(H)  Sodium 135 - 145 mmol/L 139 139 134(L)  Potassium 3.5 - 5.1 mmol/L 3.7 3.9 3.9  Chloride 98 - 111 mmol/L 102 102 101  CO2 22 - 32 mmol/L _0 Calcium 8.9 - 10.3 mg/dL 12.9(H) 10.9(H) 10.0  Total Protein 6.5 - 8.1 g/dL 7.3 7.4 7.5  Total Bilirubin 0.3 - 1.2 mg/dL 1.1 1.6(H) 1.9(H)  Alkaline Phos 38 - 126 U/L 193(H) 177(H) 176(H)  AST 15 - 41 U/L 51(H) 36 37  ALT 0 - 44 U/L _1 Lab Results  Component Value Date   WBC 4.5 10/02/2020   HGB 12.3 10/02/2020   HCT 36.3 10/02/2020   MCV 97.3 10/02/2020   PLT 141 (L) 10/02/2020   NEUTROABS 3.4 10/02/2020    ASSESSMENT & PLAN:  Primary cancer of upper outer quadrant of right female breast ([place]) Right breast invasive ductal carcinoma ER positive PR negative HER-2 positive Ki-67 44% multifocal disease 3/7 lymph nodes positive T2 N1 M0 stage IIB status post adjuvant chemotherapy with TCH followed by Herceptin maintenance, adjuvant radiation therapy and Letrozole. MRI 02/14/2015: T5 large destructive lesion with pathologic compression fracture and extensive epidural tumor, involvement of T4, excision metastatic carcinoma ER 60%, PR 0%, HER-2 positive ratio 2.71 average copy  #7.45 03/27/2015: T4-6 decompression surgery for spinal cord compression PET-CT 12/10/17Rt para-spinal mass mildy hypermetabolic SUV 5.9, lytic cortical lesion 4.8 cm lesion left prox femur suv 8.8 favor osseus met disease Echo 04/12/2017 shows well preserved ejection fraction of 55-60%  Treatment summary: 1. Resumed anastrozole 05/21/2015, but it has caused significant hair loss so we switched her to exemestane 25 mg once daily 07/23/2015 2. Herceptin every 3 week started 04/10/2015 3. Bone metastases: Zometa every 3 months 4. Radiation therapy to the spine (Dr. [name]) 04/13/2015 to 05/19/2015 5. Radiation therapy to left femur 04/28/2016 to 05/12/2016  6. SRS to the brain lesion 11/24/2016 7.Kadcyla started 05/05/2017 discontinued 07/27/2017 8.Palbociclib +Faslodex +trastuzumab+ Pertuzumabstarted 08/10/2017, stopped 04/04/2018 9.XRT to T123/04/2019 10. Enhertu started 06/01/18-12/27/2019 stopped for progression 11SRS to brain January 2021 12.Lapatinib with anastrozole stopped for progression 13.  Xeloda, Tucatinib, Herceptin -------------------------------------------------------------------------------------------------------------------------- 05/19/2020: Bone scan: Progressive bone metastatic disease, bilateral ribs, thoracic and lumbar spine, pelvis, sternum, right humeral diaphysis, bilateral femoral, new lesions are seen calvaria, ribs, right humerus, L3 vertebral body and distal left femur  05/19/2020: CT CAP: New lytic bone lesion, similar lesions T12 and L5, mild increase in the prevascular lymph nodes  CurrentTreatment: Xeloda with Tucatinib XelodaToxicities:Dec appetite, No hand foot syndrome, noticed some papular sores on hands Tucatinib Toxicities:denies diarrhea 1.Nausea and vomiting:Xeloda dose reduction. 2.Hand-foot syndrome: Mild: Moisturizers and decreased dosage of Xeloda 3.Fatigue: This is her biggest concern with worsening fatigue. 4.  Worsening  bone pain: I sent a prescription for prednisone 10 mg daily.  This will hopefully also improve her energy and appetite.    B12 def: on q 3 week B 12 inj, encouraged her to take oral B12 as well.   Hypercalcemia: We will give Zometa today because of hypercalcemia with a calcium of 12.9 today.    MRI Brain: 08/21/20: DIffuse calvarial and bone mets Bone Scan 08/20/20: Widespread bone mets, increased activity Rt humeral mets, no new bone mets CT CAP 08/20/20: mild prog of lytic bone mets some stable and some inc in size (ribs, T spine and bone pelvis)  Patient feels that the left frontal skull lesion appears to be getting worse.  She is concerned that  the treatment is not working. She would like me to refer her to see Dr. Erline Levine at [facility].  We will request a consultation.  Return to clinic every 3 weeks for Herceptin every 6 weeks to follow-up with me.    No orders of the defined types were placed in this encounter.  The patient has a good understanding of the overall plan. she agrees with it. she will call with any problems that may develop before the next visit here. Total time spent: 30 mins including face to face time and time spent for planning, charting and co-ordination of care   Harriette Ohara, MD 10/02/20

## 2020-10-02 NOTE — Progress Notes (Signed)
Referral successfully faxed to Dr. Erline Levine at [phone]

## 2020-10-02 NOTE — Patient Instructions (Signed)
[facility]  Discharge Instructions: Thank you for choosing [facility] to provide your oncology and hematology care.   If you have a lab appointment with the [facility], please go directly to the [facility] and check in at the registration area.   Wear comfortable clothing and clothing appropriate for easy access to any Portacath or PICC line.   We strive to give you quality time with your provider. You may need to reschedule your appointment if you arrive late (15 or more minutes).  Arriving late affects you and other patients whose appointments are after yours.  Also, if you miss three or more appointments without notifying the office, you may be dismissed from the clinic at the provider's discretion.      For prescription refill requests, have your pharmacy contact our office and allow 72 hours for refills to be completed.    Today you received the following chemotherapy and/or immunotherapy agent: Trastuzumab.      To help prevent nausea and vomiting after your treatment, we encourage you to take your nausea medication as directed.  BELOW ARE SYMPTOMS THAT SHOULD BE REPORTED IMMEDIATELY: . *FEVER GREATER THAN 100.4 F (38 C) OR HIGHER . *CHILLS OR SWEATING . *NAUSEA AND VOMITING THAT IS NOT CONTROLLED WITH YOUR NAUSEA MEDICATION . *UNUSUAL SHORTNESS OF BREATH . *UNUSUAL BRUISING OR BLEEDING . *URINARY PROBLEMS (pain or burning when urinating, or frequent urination) . *BOWEL PROBLEMS (unusual diarrhea, constipation, pain near the anus) . TENDERNESS IN MOUTH AND THROAT WITH OR WITHOUT PRESENCE OF ULCERS (sore throat, sores in mouth, or a toothache) . UNUSUAL RASH, SWELLING OR PAIN  . UNUSUAL VAGINAL DISCHARGE OR ITCHING   Items with * indicate a potential emergency and should be followed up as soon as possible or go to the Emergency Department if any problems should occur.  Please show the CHEMOTHERAPY ALERT CARD or IMMUNOTHERAPY  ALERT CARD at check-in to the Emergency Department and triage nurse.  Should you have questions after your visit or need to cancel or reschedule your appointment, please contact [facility]  Dept: [phone]  and follow the prompts.  Office hours are 8:00 a.m. to 4:30 p.m. Monday - Friday. Please note that voicemails left after 4:00 p.m. may not be returned until the following business day.  We are closed weekends and major holidays. You have access to a nurse at all times for urgent questions. Please call the main number to the clinic Dept: [phone] and follow the prompts.   For any non-urgent questions, you may also contact your provider using MyChart. We now offer e-Visits for anyone 72 and older to request care online for non-urgent symptoms. For details visit [email or web address].   Also download the MyChart app! Go to the app store, search "MyChart", open the app, select [facility], and log in with your MyChart username and password.  Due to Covid, a mask is required upon entering the hospital/clinic. If you do not have a mask, one will be given to you upon arrival. For doctor visits, patients may have 1 support person aged 24 or older with them. For treatment visits, patients cannot have anyone with them due to current Covid guidelines and our immunocompromised population.   Cyanocobalamin, Vitamin B12 injection What is this medicine? CYANOCOBALAMIN (sye an oh koe BAL a min) is a man made form of vitamin B12. Vitamin B12 is used in the growth of healthy blood cells, nerve cells, and proteins  in the body. It also helps with the metabolism of fats and carbohydrates. This medicine is used to treat people who can not absorb vitamin B12. This medicine may be used for other purposes; ask your health care provider or pharmacist if you have questions. COMMON BRAND NAME(S): B-12 Compliance Kit, B-12 Injection Kit, Cyomin, LA-12, Nutri-Twelve, Physicians EZ Use  B-12, Primabalt What should I tell my health care provider before I take this medicine? They need to know if you have any of these conditions:  kidney disease  Leber's disease  megaloblastic anemia  an unusual or allergic reaction to cyanocobalamin, cobalt, other medicines, foods, dyes, or preservatives  pregnant or trying to get pregnant  breast-feeding How should I use this medicine? This medicine is injected into a muscle or deeply under the skin. It is usually given by a health care professional in a clinic or doctor's office. However, your doctor may teach you how to inject yourself. Follow all instructions. Talk to your pediatrician regarding the use of this medicine in children. Special care may be needed. Overdosage: If you think you have taken too much of this medicine contact a poison control center or emergency room at once. NOTE: This medicine is only for you. Do not share this medicine with others. What if I miss a dose? If you are given your dose at a clinic or doctor's office, call to reschedule your appointment. If you give your own injections and you miss a dose, take it as soon as you can. If it is almost time for your next dose, take only that dose. Do not take double or extra doses. What may interact with this medicine?  colchicine  heavy alcohol intake This list may not describe all possible interactions. Give your health care provider a list of all the medicines, herbs, non-prescription drugs, or dietary supplements you use. Also tell them if you smoke, drink alcohol, or use illegal drugs. Some items may interact with your medicine. What should I watch for while using this medicine? Visit your doctor or health care professional regularly. You may need blood work done while you are taking this medicine. You may need to follow a special diet. Talk to your doctor. Limit your alcohol intake and avoid smoking to get the best benefit. What side effects may I notice from  receiving this medicine? Side effects that you should report to your doctor or health care professional as soon as possible:  allergic reactions like skin rash, itching or hives, swelling of the face, lips, or tongue  blue tint to skin  chest tightness, pain  difficulty breathing, wheezing  dizziness  red, swollen painful area on the leg Side effects that usually do not require medical attention (report to your doctor or health care professional if they continue or are bothersome):  diarrhea  headache This list may not describe all possible side effects. Call your doctor for medical advice about side effects. You may report side effects to FDA at 1-800-FDA-1088. Where should I keep my medicine? Keep out of the reach of children. Store at room temperature between 15 and 30 degrees C (59 and 85 degrees F). Protect from light. Throw away any unused medicine after the expiration date. NOTE: This sheet is a summary. It may not cover all possible information. If you have questions about this medicine, talk to your doctor, pharmacist, or health care provider.  2021 Elsevier/Gold Standard (2007-08-06 22:10:20)

## 2020-10-06 DIAGNOSIS — C50411 Malignant neoplasm of upper-outer quadrant of right female breast: Secondary | ICD-10-CM | POA: Diagnosis not present

## 2020-10-06 DIAGNOSIS — I10 Essential (primary) hypertension: Secondary | ICD-10-CM | POA: Diagnosis not present

## 2020-10-06 DIAGNOSIS — K219 Gastro-esophageal reflux disease without esophagitis: Secondary | ICD-10-CM | POA: Diagnosis not present

## 2020-10-06 DIAGNOSIS — E785 Hyperlipidemia, unspecified: Secondary | ICD-10-CM | POA: Diagnosis not present

## 2020-10-07 ENCOUNTER — Other Ambulatory Visit: Payer: Self-pay | Admitting: Radiation Therapy

## 2020-10-07 ENCOUNTER — Other Ambulatory Visit (HOSPITAL_COMMUNITY): Payer: Self-pay

## 2020-10-07 DIAGNOSIS — C7931 Secondary malignant neoplasm of brain: Secondary | ICD-10-CM

## 2020-10-08 ENCOUNTER — Other Ambulatory Visit (HOSPITAL_COMMUNITY): Payer: Self-pay

## 2020-10-09 ENCOUNTER — Other Ambulatory Visit (HOSPITAL_COMMUNITY): Payer: Self-pay

## 2020-10-12 ENCOUNTER — Other Ambulatory Visit (HOSPITAL_COMMUNITY): Payer: Self-pay

## 2020-10-14 ENCOUNTER — Other Ambulatory Visit (HOSPITAL_COMMUNITY): Payer: Self-pay

## 2020-10-19 ENCOUNTER — Other Ambulatory Visit (HOSPITAL_COMMUNITY): Payer: Self-pay

## 2020-10-19 ENCOUNTER — Encounter: Payer: Self-pay | Admitting: Hematology and Oncology

## 2020-10-20 ENCOUNTER — Telehealth: Payer: Self-pay | Admitting: *Deleted

## 2020-10-20 ENCOUNTER — Other Ambulatory Visit (HOSPITAL_COMMUNITY): Payer: Self-pay

## 2020-10-20 NOTE — Telephone Encounter (Addendum)
Received call from pt stating that she is at dental school/[employer] to get teeth cleaned but they need Dr Geralyn Flash sig.  She will have them fax form to [place] to be signed ASAP.  She also reports that she has found another lesion on her skull & wonders if Dr [name] would want to see her while she is here Friday & before she goes to [facility].  She wonders if she needs an MRI.  Message to Dr [name]. Received form from [facility] & returned via fax to [phone].

## 2020-10-21 NOTE — Progress Notes (Signed)
Genetic testing results successfully faxed to [phone] for [facility].

## 2020-10-23 ENCOUNTER — Other Ambulatory Visit: Payer: Self-pay

## 2020-10-23 ENCOUNTER — Other Ambulatory Visit (HOSPITAL_COMMUNITY): Payer: Self-pay

## 2020-10-23 ENCOUNTER — Inpatient Hospital Stay: Payer: Medicare Other | Attending: Hematology and Oncology

## 2020-10-23 VITALS — BP 132/55 | HR 73 | Temp 98.1°F | Resp 16

## 2020-10-23 DIAGNOSIS — C50911 Malignant neoplasm of unspecified site of right female breast: Secondary | ICD-10-CM

## 2020-10-23 DIAGNOSIS — Z5112 Encounter for antineoplastic immunotherapy: Secondary | ICD-10-CM | POA: Insufficient documentation

## 2020-10-23 DIAGNOSIS — C7951 Secondary malignant neoplasm of bone: Secondary | ICD-10-CM | POA: Insufficient documentation

## 2020-10-23 DIAGNOSIS — C50411 Malignant neoplasm of upper-outer quadrant of right female breast: Secondary | ICD-10-CM | POA: Insufficient documentation

## 2020-10-23 DIAGNOSIS — C7931 Secondary malignant neoplasm of brain: Secondary | ICD-10-CM | POA: Diagnosis not present

## 2020-10-23 LAB — CMP (CANCER CENTER ONLY)
ALT: 33 U/L (ref 0–44)
AST: 53 U/L — ABNORMAL HIGH (ref 15–41)
Albumin: 3.9 g/dL (ref 3.5–5.0)
Alkaline Phosphatase: 212 U/L — ABNORMAL HIGH (ref 38–126)
Anion gap: 11 (ref 5–15)
BUN: 17 mg/dL (ref 8–23)
CO2: 26 mmol/L (ref 22–32)
Calcium: 11.9 mg/dL — ABNORMAL HIGH (ref 8.9–10.3)
Chloride: 98 mmol/L (ref 98–111)
Creatinine: 1.17 mg/dL — ABNORMAL HIGH (ref 0.44–1.00)
GFR, Estimated: 49 mL/min — ABNORMAL LOW (ref >60)
Glucose, Bld: 98 mg/dL (ref 70–99)
Potassium: 3.8 mmol/L (ref 3.5–5.1)
Sodium: 135 mmol/L (ref 135–145)
Total Bilirubin: 0.9 mg/dL (ref 0.3–1.2)
Total Protein: 7.4 g/dL (ref 6.5–8.1)

## 2020-10-23 MED ORDER — ACETAMINOPHEN 325 MG PO TABS
ORAL_TABLET | ORAL | Status: AC
  Filled 2020-10-23: qty 2

## 2020-10-23 MED ORDER — ACETAMINOPHEN 325 MG PO TABS
650.0000 mg | ORAL_TABLET | Freq: Once | ORAL | Status: AC
Start: 2020-10-23 — End: 2020-10-23
  Administered 2020-10-23: 650 mg via ORAL

## 2020-10-23 MED ORDER — HEPARIN SOD (PORK) LOCK FLUSH 100 UNIT/ML IV SOLN
500.0000 [IU] | Freq: Once | INTRAVENOUS | Status: AC | PRN
  Administered 2020-10-23: 500 [IU]
  Filled 2020-10-23: qty 5

## 2020-10-23 MED ORDER — TRASTUZUMAB-DKST CHEMO 150 MG IV SOLR
6.0000 mg/kg | Freq: Once | INTRAVENOUS | Status: AC
  Administered 2020-10-23: 336 mg via INTRAVENOUS
  Filled 2020-10-23: qty 16

## 2020-10-23 MED ORDER — SODIUM CHLORIDE 0.9% FLUSH
10.0000 mL | INTRAVENOUS | Status: DC | PRN
  Administered 2020-10-23: 10 mL
  Filled 2020-10-23: qty 10

## 2020-10-23 MED ORDER — CYANOCOBALAMIN 1000 MCG/ML IJ SOLN
1000.0000 ug | INTRAMUSCULAR | Status: DC
  Administered 2020-10-23: 1000 ug via INTRAMUSCULAR

## 2020-10-23 MED ORDER — CYANOCOBALAMIN 1000 MCG/ML IJ SOLN
INTRAMUSCULAR | Status: AC
  Filled 2020-10-23: qty 1

## 2020-10-23 MED ORDER — SODIUM CHLORIDE 0.9 % IV SOLN
Freq: Once | INTRAVENOUS | Status: AC
Start: 2020-10-23 — End: 2020-10-23
  Filled 2020-10-23: qty 250

## 2020-10-23 MED FILL — Capecitabine Tab 500 MG: ORAL | 21 days supply | Qty: 28 | Fill #2 | Status: AC

## 2020-10-23 NOTE — Patient Instructions (Signed)
[name] ONCOLOGY  Discharge Instructions: Thank you for choosing [facility] to provide your oncology and hematology care.   If you have a lab appointment with the [facility], please go directly to the [facility] and check in at the registration area.   Wear comfortable clothing and clothing appropriate for easy access to any Portacath or PICC line.   We strive to give you quality time with your provider. You may need to reschedule your appointment if you arrive late (15 or more minutes).  Arriving late affects you and other patients whose appointments are after yours.  Also, if you miss three or more appointments without notifying the office, you may be dismissed from the clinic at the provider's discretion.      For prescription refill requests, have your pharmacy contact our office and allow 72 hours for refills to be completed.    Today you received the following chemotherapy and/or immunotherapy agent: Trastuzumab.      To help prevent nausea and vomiting after your treatment, we encourage you to take your nausea medication as directed.  BELOW ARE SYMPTOMS THAT SHOULD BE REPORTED IMMEDIATELY: *FEVER GREATER THAN 100.4 F (38 C) OR HIGHER *CHILLS OR SWEATING *NAUSEA AND VOMITING THAT IS NOT CONTROLLED WITH YOUR NAUSEA MEDICATION *UNUSUAL SHORTNESS OF BREATH *UNUSUAL BRUISING OR BLEEDING *URINARY PROBLEMS (pain or burning when urinating, or frequent urination) *BOWEL PROBLEMS (unusual diarrhea, constipation, pain near the anus) TENDERNESS IN MOUTH AND THROAT WITH OR WITHOUT PRESENCE OF ULCERS (sore throat, sores in mouth, or a toothache) UNUSUAL RASH, SWELLING OR PAIN  UNUSUAL VAGINAL DISCHARGE OR ITCHING   Items with * indicate a potential emergency and should be followed up as soon as possible or go to the Emergency Department if any problems should occur.  Please show the CHEMOTHERAPY ALERT CARD or IMMUNOTHERAPY ALERT CARD at check-in  to the Emergency Department and triage nurse.  Should you have questions after your visit or need to cancel or reschedule your appointment, please contact [facility]  Dept: [phone]  and follow the prompts.  Office hours are 8:00 a.m. to 4:30 p.m. Monday - Friday. Please note that voicemails left after 4:00 p.m. may not be returned until the following business day.  We are closed weekends and major holidays. You have access to a nurse at all times for urgent questions. Please call the main number to the clinic Dept: [phone] and follow the prompts.   For any non-urgent questions, you may also contact your provider using MyChart. We now offer e-Visits for anyone 12 and older to request care online for non-urgent symptoms. For details visit [email or web address].   Also download the MyChart app! Go to the app store, search "MyChart", open the app, select [facility], and log in with your MyChart username and password.  Due to Covid, a mask is required upon entering the hospital/clinic. If you do not have a mask, one will be given to you upon arrival. For doctor visits, patients may have 1 support person aged 51 or older with them. For treatment visits, patients cannot have anyone with them due to current Covid guidelines and our immunocompromised population.   Cyanocobalamin, Vitamin B12 injection What is this medicine? CYANOCOBALAMIN (sye an oh koe BAL a min) is a man made form of vitamin B12. Vitamin B12 is used in the growth of healthy blood cells, nerve cells, and proteins in the body. It also helps with the metabolism of  fats and carbohydrates. This medicine is used to treat people who can not absorb vitamin B12. This medicine may be used for other purposes; ask your health care provider or pharmacist if you have questions. COMMON BRAND NAME(S): B-12 Compliance Kit, B-12 Injection Kit, Cyomin, LA-12, Nutri-Twelve, Physicians EZ Use B-12, Primabalt What  should I tell my health care provider before I take this medicine? They need to know if you have any of these conditions: kidney disease Leber's disease megaloblastic anemia an unusual or allergic reaction to cyanocobalamin, cobalt, other medicines, foods, dyes, or preservatives pregnant or trying to get pregnant breast-feeding How should I use this medicine? This medicine is injected into a muscle or deeply under the skin. It is usually given by a health care professional in a clinic or doctor's office. However, your doctor may teach you how to inject yourself. Follow all instructions. Talk to your pediatrician regarding the use of this medicine in children. Special care may be needed. Overdosage: If you think you have taken too much of this medicine contact a poison control center or emergency room at once. NOTE: This medicine is only for you. Do not share this medicine with others. What if I miss a dose? If you are given your dose at a clinic or doctor's office, call to reschedule your appointment. If you give your own injections and you miss a dose, take it as soon as you can. If it is almost time for your next dose, take only that dose. Do not take double or extra doses. What may interact with this medicine? colchicine heavy alcohol intake This list may not describe all possible interactions. Give your health care provider a list of all the medicines, herbs, non-prescription drugs, or dietary supplements you use. Also tell them if you smoke, drink alcohol, or use illegal drugs. Some items may interact with your medicine. What should I watch for while using this medicine? Visit your doctor or health care professional regularly. You may need blood work done while you are taking this medicine. You may need to follow a special diet. Talk to your doctor. Limit your alcohol intake and avoid smoking to get the best benefit. What side effects may I notice from receiving this medicine? Side  effects that you should report to your doctor or health care professional as soon as possible: allergic reactions like skin rash, itching or hives, swelling of the face, lips, or tongue blue tint to skin chest tightness, pain difficulty breathing, wheezing dizziness red, swollen painful area on the leg Side effects that usually do not require medical attention (report to your doctor or health care professional if they continue or are bothersome): diarrhea headache This list may not describe all possible side effects. Call your doctor for medical advice about side effects. You may report side effects to FDA at 1-800-FDA-1088. Where should I keep my medicine? Keep out of the reach of children. Store at room temperature between 15 and 30 degrees C (59 and 85 degrees F). Protect from light. Throw away any unused medicine after the expiration date. NOTE: This sheet is a summary. It may not cover all possible information. If you have questions about this medicine, talk to your doctor, pharmacist, or health care provider.  2021 Elsevier/Gold Standard (2007-08-06 22:10:20)

## 2020-10-26 ENCOUNTER — Other Ambulatory Visit: Payer: Self-pay | Admitting: *Deleted

## 2020-10-26 ENCOUNTER — Other Ambulatory Visit: Payer: Self-pay

## 2020-10-26 ENCOUNTER — Other Ambulatory Visit (HOSPITAL_COMMUNITY): Payer: Self-pay

## 2020-10-26 MED ORDER — AMOXICILLIN 500 MG PO CAPS: ORAL_CAPSULE | ORAL | 0 refills | Status: DC

## 2020-11-02 DIAGNOSIS — C7931 Secondary malignant neoplasm of brain: Secondary | ICD-10-CM | POA: Diagnosis not present

## 2020-11-02 DIAGNOSIS — C50411 Malignant neoplasm of upper-outer quadrant of right female breast: Secondary | ICD-10-CM | POA: Diagnosis not present

## 2020-11-02 DIAGNOSIS — C7951 Secondary malignant neoplasm of bone: Secondary | ICD-10-CM | POA: Diagnosis not present

## 2020-11-02 DIAGNOSIS — C779 Secondary and unspecified malignant neoplasm of lymph node, unspecified: Secondary | ICD-10-CM | POA: Diagnosis not present

## 2020-11-04 ENCOUNTER — Other Ambulatory Visit (HOSPITAL_COMMUNITY): Payer: Self-pay

## 2020-11-05 ENCOUNTER — Other Ambulatory Visit (HOSPITAL_COMMUNITY): Payer: Self-pay

## 2020-11-05 DIAGNOSIS — I10 Essential (primary) hypertension: Secondary | ICD-10-CM | POA: Diagnosis not present

## 2020-11-12 NOTE — Progress Notes (Signed)
Patient Care Team: [name], MD as PCP - General (Family Medicine) [name], MD as Consulting Physician (Hematology and Oncology) Bensimhon, Shaune Pascal, MD as Consulting Physician (Cardiology) Marletta Lor as Consulting Physician (Internal Medicine) Tyler Pita, MD as Consulting Physician (Radiation Oncology)  DIAGNOSIS:    ICD-10-CM   1. Bone metastasis ([place])  C79.51 CT CHEST ABDOMEN PELVIS W CONTRAST    NM Bone Scan Whole Body    2. Brain metastasis ([place])  C79.31 CT CHEST ABDOMEN PELVIS W CONTRAST    NM Bone Scan Whole Body    3. Carcinoma of right breast, stage 4 (HCC)  C50.911 CT CHEST ABDOMEN PELVIS W CONTRAST    NM Bone Scan Whole Body    4. Primary cancer of upper outer quadrant of right female breast ([place])  C50.411 CT CHEST ABDOMEN PELVIS W CONTRAST    NM Bone Scan Whole Body      SUMMARY OF ONCOLOGIC HISTORY: Oncology History  Primary cancer of upper outer quadrant of right female breast ([place])  03/08/2012 Initial Diagnosis   Right breast invasive ductal carcinoma ER positive PR negative HER-2 positive Ki-67 44%; another breast mass biopsied in the anterior part of the breast which was also positive for malignancy that was HER-2 negative    03/14/2012 Cancer Staging   Staging form: Breast, AJCC 7th Edition - Pathologic: Stage IV (M1) - Signed by [name], NP on 06/22/2018    05/10/2012 Surgery   Right mastectomy and axillary lymph node dissection: Multifocal disease 5 cm, 1.7 cm, 1.6 cm, ER positive PR negative HER-2 positive Ki-67 44%, 3/7 lymph nodes positive    06/14/2012 - 06/12/2013 Chemotherapy   Adjuvant chemotherapy with Bishop 6 followed by Herceptin maintenance    10/25/2012 - 12/11/2012 Radiation Therapy   Adjuvant radiation therapy    02/07/2013 -  Anti-estrogen oral therapy   Letrozole 2.5 mg daily    02/14/2015 Imaging   MRI spine: Large destructive T5 lesion with severe pathologic compression fracture and  extensive epidural tumor, severe spinal stenosis with moderate cord compression, tumor involvement of T4    03/18/2015 PET scan   Residual enhancing soft tissue adjacent to the spinal cord. No evidence of metastatic disease. Nonspecific uptake the left nipple    03/27/2015 - 03/30/2015 Hospital Admission   T4-6 decompression for spinal cord compression (lower extremity paralysis)    04/10/2015 -  Chemotherapy   Palliative treatment with Herceptin every 3 weeks with letrozole 2.5 mg daily    04/13/2015 - 05/19/2015 Radiation Therapy   Palliative radiation treatment to the spine    09/01/2015 Imaging   CT chest abdomen pelvis: Pathologic fracture with posterior fusion at T5 no other evidence of metastatic disease in the chest abdomen pelvis    12/23/2015 Imaging   CT CAP: new nonspecific 0.6 cm lymph node was stated mediastinum needs follow-up CT, innumerable tiny groundglass pulmonary nodules throughout both lungs unchanged, patchy consolidation from radiation, T5 fracture, no mets    04/15/2016 PET scan   Rt para-spinal mass mildy hypermetabolic SUV 5.9, lytic cortical lesion 4.8 cm lesion left prox femur suv 8.8 favor osseus met disease    04/28/2016 - 05/12/2016 Radiation Therapy   Palliative XRT Left femur    05/24/2016 Procedure   Soft tissue mass biopsy back: Metastatic breast cancer ER 80%, PR 0%, Ki-67 50%, HER-2 positive ratio 2.25    06/09/2016 - 03/29/2017 Anti-estrogen oral therapy   Faslodex with Herceptin and Perjeta every 4 weeks  06/13/2016 - 06/15/2016 Hospital Admission   uncomplicated revision of previous thoracic instrumentation.    10/10/2016 PET scan   Interval development of new hypermetabolic 2.5 x 1.1 cm lesion posterior right eighth rib consistent with metastatic disease, stable right paraspinal lesion at T8, clustered soft tissue nodules in the posterior midline back near cervicothoracic junction smaller than before     11/03/2016 - 11/07/2016 Radiation  Therapy   Palliative radiation to right eighth rib    11/04/2016 Imaging   Solitary contrast-enhancing lesion in the right cerebellum 6 x 2 mm concerning for metastatic lesion    11/24/2016 - 11/24/2016 Radiation Therapy   SRS to the brain lesion    04/18/2017 PET scan   New ill-defined rounded airspace opacity in posterior right lower lobe. Differential diagnosis includes malignancy and infectious/inflammatory process.Increased hypermetabolic lytic lesion in T8 vertebral body, consistent with bone metastasis.  Increased hypermetabolic posterior upper chest wall subcutaneous nodules, highly suspicious metastatic disease. Further improvement in right posterior eighth rib metastasis.    05/04/2017 - 07/27/2017 Chemotherapy   Kadcyla every 3 weeks palliative chemotherapy stopped for progression    07/26/2017 Imaging   Interval increase in size of the multiple enhancing masses posteriorly overlying the spine, interval development of left axillary and mediastinal adenopathy, increase in the size of lytic lesions in T8-T9 and left ninth rib, increased groundglass opacities right lower lobe lung    07/27/2017 - 05/11/2018 Chemotherapy   Palbociclib + Faslodex + trastuzumab + Pertuzumab     04/03/2018 PET scan   PET CT scan showed progression of disease not only in the bones but also in the subcutaneous nodules.    05/15/2018 - 05/25/2018 Radiation Therapy   Palliative radiation to the back    06/01/2018 - 12/27/2019 Chemotherapy   Enhertu (Transtuzumab Deruxtecan)     08/07/2018 - 08/07/2018 Radiation Therapy   T12 vertebral body SRS    01/2020 - 10/23/2020 Chemotherapy    Xeloda, Tucatinib, Herceptin    Carcinoma of right breast, stage 4 (HCC)  02/14/2015 Initial Diagnosis   Breast cancer metastasized to bone, right ([place])    06/01/2018 - 02/07/2020 Chemotherapy   The patient had palonosetron (ALOXI) injection 0.25 mg, 0.25 mg, Intravenous,  Once, 24 of 30 cycles Administration: 0.25  mg (06/01/2018), 0.25 mg (06/22/2018), 0.25 mg (08/24/2018), 0.25 mg (07/13/2018), 0.25 mg (08/03/2018), 0.25 mg (09/14/2018), 0.25 mg (10/05/2018), 0.25 mg (11/16/2018), 0.25 mg (10/26/2018), 0.25 mg (12/07/2018), 0.25 mg (12/28/2018), 0.25 mg (01/18/2019), 0.25 mg (03/15/2019), 0.25 mg (02/15/2019), 0.25 mg (04/12/2019), 0.25 mg (05/09/2019), 0.25 mg (06/07/2019), 0.25 mg (07/05/2019), 0.25 mg (08/09/2019), 0.25 mg (09/13/2019), 0.25 mg (10/18/2019), 0.25 mg (11/22/2019), 0.25 mg (12/27/2019) fam-trastuzumab deruxtecan-nxki (ENHERTU) 332 mg in dextrose 5 % 100 mL chemo infusion, 5.4 mg/kg = 332 mg, Intravenous,  Once, 24 of 30 cycles Dose modification: 4.4 mg/kg (original dose 5.4 mg/kg, Cycle 7, Reason: Provider Judgment), 3.2 mg/kg (original dose 5.4 mg/kg, Cycle 9, Reason: Dose not tolerated), 150 mg (original dose 5.4 mg/kg, Cycle 18, Reason: Dose not tolerated) Administration: 332 mg (06/01/2018), 332 mg (06/22/2018), 332 mg (07/13/2018), 332 mg (08/24/2018), 332 mg (08/03/2018), 332 mg (09/14/2018), 272 mg (10/05/2018), 200 mg (11/16/2018), 272 mg (10/26/2018), 200 mg (12/07/2018), 200 mg (12/28/2018), 200 mg (01/18/2019), 200 mg (03/15/2019), 200 mg (02/15/2019), 200 mg (04/12/2019), 200 mg (05/09/2019), 200 mg (06/07/2019), 150 mg (07/05/2019), 150 mg (08/09/2019), 150 mg (09/13/2019), 150 mg (10/18/2019), 150 mg (11/22/2019), 150 mg (12/27/2019)   for chemotherapy treatment.     05/29/2020 -  Chemotherapy      Patient is on Antibody Plan: BREAST TRASTUZUMAB Q21D     Metastatic cancer to spine ([place])  Brain metastasis ([place])  11/18/2016 Initial Diagnosis   Brain metastasis ([place])    05/29/2020 -  Chemotherapy      Patient is on Antibody Plan: BREAST TRASTUZUMAB Q21D       CHIEF COMPLIANT: Herceptin infusion  INTERVAL HISTORY: Heather [name] is a 75 y.o. with above-mentioned history of metastatic breast cancer HER2 positive disease who is currently on Xeloda with Tucatinib and Herceptin. She presents to the clinic today for treatment.   Because of good slow and gradual progression of disease we decided to discontinue Xeloda and Tucatinib.  She went to see Dr. Erline Levine at [facility] who recommended Navelbine with margetuximab.  She is also going to be set up for a brain MRI and for possible stereotactic radiosurgery.  She is debating whether to do it at [facility] or at [facility].  ALLERGIES:  is allergic to other, acyclovir and related, chlorhexidine, monascus purpureus went yeast, pravastatin sodium, tegaderm ag mesh [silver], zanaflex [tizanidine hcl], codeine, keflex [cephalexin], naprosyn [naproxen], oxycodone, tramadol, and tussin [guaifenesin].  MEDICATIONS:  Current Outpatient Medications  Medication Sig Dispense Refill   acetaminophen (TYLENOL) 500 MG tablet Take 500 mg by mouth every 6 (six) hours as needed.     ALPRAZolam (XANAX) 0.25 MG tablet Take 1 tablet by mouth as needed.     amoxicillin (AMOXIL) 500 MG capsule Take 1 tablet ( 500 mg) , 2 times per day for 7 days.  Begin day for dental procedure (Patient not taking: Reported on 11/13/2020) 14 capsule 0   BOOSTRIX 5-2.5-18.5 LF-MCG/0.5 injection      Cholecalciferol (VITAMIN D) 2000 UNITS tablet Take 2,000 Units by mouth daily.     COLLAGEN PO Take by mouth.     Cranberry 250 MG CAPS Take by mouth.     famotidine (PEPCID) 40 MG tablet Take 40 mg by mouth daily.     gabapentin (NEURONTIN) 600 MG tablet Take 600 mg by mouth 2 (two) times daily.     HYDROcodone-acetaminophen (NORCO/VICODIN) 5-325 MG tablet Take 1 tablet by mouth every 6 (six) hours as needed for moderate pain. 30 tablet 0   lidocaine-prilocaine (EMLA) cream Apply to affected area once 30 g 3   ondansetron (ZOFRAN) 8 MG tablet Take 1 tablet (8 mg total) by mouth every 8 (eight) hours as needed for nausea or vomiting. 20 tablet 0   OVER THE COUNTER MEDICATION Place 1 drop into both eyes 2 (two) times daily as needed (dry eyes). Over the counter lubricating eye drops     polyethylene glycol (MIRALAX / GLYCOLAX)  packet Take 17 g by mouth daily as needed.     predniSONE (DELTASONE) 10 MG tablet Take 1 tablet (10 mg total) by mouth daily with breakfast. 30 tablet 1   prochlorperazine (COMPAZINE) 10 MG tablet Take 1 tablet (10 mg total) by mouth every 6 (six) hours as needed for nausea or vomiting. 60 tablet 3   spironolactone (ALDACTONE) 25 MG tablet Take 25 mg by mouth daily.     No current facility-administered medications for this visit.   Facility-Administered Medications Ordered in Other Visits  Medication Dose Route Frequency Provider Last Rate Last Admin   cyanocobalamin ((VITAMIN B-12)) injection 1,000 mcg  1,000 mcg Intramuscular Q6 weeks [name], MD   1,000 mcg at 03/19/20 1110   heparin lock flush 100 unit/mL  500 Units Intracatheter  Once [name], MD       heparin lock flush 100 unit/mL  500 Units Intracatheter Once PRN [name], MD       sodium chloride flush (NS) 0.9 % injection 10 mL  10 mL Intracatheter Once [name], MD       sodium chloride flush (NS) 0.9 % injection 10 mL  10 mL Intracatheter PRN [name], MD       trastuzumab-dkst (OGIVRI) 300 mg in sodium chloride 0.9 % 250 mL chemo infusion  300 mg Intravenous Once [name], MD 560 mL/hr at 11/13/20 1152 Infusion Verify at 11/13/20 1152    PHYSICAL EXAMINATION: ECOG PERFORMANCE STATUS: 2 - Symptomatic, <50% confined to bed  Vitals:   11/13/20 1001  BP: (!) 146/63  Pulse: 87  Resp: 19  Temp: 97.7 F (36.5 C)  SpO2: 98%   Filed Weights   11/13/20 1001  Weight: 112 lb 2 oz (50.9 kg)     LABORATORY DATA:  I have reviewed the data as listed CMP Latest Ref Rng & Units 11/13/2020 10/23/2020 10/02/2020  Glucose 70 - 99 mg/dL 117(H) 98 97  BUN 8 - 23 mg/dL '18 17 14  ' Creatinine 0.44 - 1.00 mg/dL 1.19(H) 1.17(H) 1.19(H)  Sodium 135 - 145 mmol/L 138 135 139  Potassium 3.5 - 5.1 mmol/L 3.4(L) 3.8 3.7  Chloride 98 - 111 mmol/L 100 98 102  CO2 22 - 32 mmol/L '29 26 28  ' Calcium 8.9 - 10.3 mg/dL  14.1(HH) 11.9(H) 12.9(H)  Total Protein 6.5 - 8.1 g/dL 7.4 7.4 7.3  Total Bilirubin 0.3 - 1.2 mg/dL 0.9 0.9 1.1  Alkaline Phos 38 - 126 U/L 231(H) 212(H) 193(H)  AST 15 - 41 U/L 64(H) 53(H) 51(H)  ALT 0 - 44 U/L 26 33 19    Lab Results  Component Value Date   WBC 5.8 11/13/2020   HGB 12.8 11/13/2020   HCT 37.8 11/13/2020   MCV 96.4 11/13/2020   PLT 150 11/13/2020   NEUTROABS 4.8 11/13/2020    ASSESSMENT & PLAN:  Primary cancer of upper outer quadrant of right female breast ([place]) Right breast invasive ductal carcinoma ER positive PR negative HER-2 positive Ki-67 44% multifocal disease 3/7 lymph nodes positive T2 N1 M0 stage IIB status post adjuvant chemotherapy with TCH followed by Herceptin maintenance, adjuvant radiation therapy and Letrozole. MRI 02/14/2015: T5 large destructive lesion with pathologic compression fracture and extensive epidural tumor, involvement of T4, excision metastatic carcinoma ER 60%, PR 0%, HER-2 positive ratio 2.71 average copy #7.45 03/27/2015: T4-6 decompression surgery for spinal cord compression PET-CT 04/17/16 Rt para-spinal mass mildy hypermetabolic SUV 5.9, lytic cortical lesion 4.8 cm lesion left prox femur suv 8.8 favor osseus met disease Echo 04/12/2017 shows well preserved ejection fraction of 55-60%    Treatment summary: 1. Resumed anastrozole 05/21/2015, but it has caused significant hair loss so we switched her to exemestane 25 mg once daily 07/23/2015 2. Herceptin every 3 week started 04/10/2015 3. Bone metastases: Zometa every 3 months 4. Radiation therapy to the spine (Dr. [name]) 04/13/2015 to 05/19/2015 5. Radiation therapy to left femur 04/28/2016 to 05/12/2016 6. SRS to the brain lesion 11/24/2016 7.  Kadcyla started 05/05/2017 discontinued 07/27/2017 8.Palbociclib + Faslodex + trastuzumab + Pertuzumab started 08/10/2017, stopped 04/04/2018 9. XRT to T12 07/19/2018 10. Enhertu started 06/01/18-12/27/2019 stopped for progression 11 SRS to  brain January 2021 12.  Lapatinib with anastrozole stopped for progression 13.  Xeloda, Tucatinib, Herceptin discontinued 10/24/2019 -------------------------------------------------------------------------------------------------------------------------- MRI Brain: 08/21/20: DIffuse  calvarial and bone mets Bone Scan 08/20/20: Widespread bone mets, increased activity Rt humeral mets, no new bone mets CT CAP 08/20/20: mild prog of lytic bone mets some stable and some inc in size (ribs, T spine and bone pelvis)  Treatment plan: Navelbine (20 mg per metered squared 2 weeks on 1 week off), margetuximab (every 3 weeks)  Counseling: I discussed the risks and benefits of Navelbine as well as margetuximab.  Now all been associated hematological toxicities as well as neuropathy and pulmonary toxicities were discussed.  Margetuximab related infusion reactions as well as cardiovascular toxicities were discussed. We will obtain staging scans to have a baseline.  We will perform CT chest abdomen pelvis and bone scan.  Brain MRI is to be done followed by stereotactic radiosurgery. Generalized weakness: I sent a prescription for a walker.  She has gotten progressively weaker. Return to clinic in 1 week to start treatment    Orders Placed This Encounter  Procedures   CT CHEST ABDOMEN PELVIS W CONTRAST    Standing Status:   Future    Standing Expiration Date:   11/13/2021    Order Specific Question:   If indicated for the ordered procedure, I authorize the administration of contrast media per Radiology protocol    Answer:   Yes    Order Specific Question:   Preferred imaging location?    Answer:   [facility]    Order Specific Question:   Is Oral Contrast requested for this exam?    Answer:   Yes, Per Radiology protocol    Order Specific Question:   Reason for Exam (SYMPTOM  OR DIAGNOSIS REQUIRED)    Answer:   breast cancer ? progression   NM Bone Scan Whole Body    Standing Status:   Future     Standing Expiration Date:   11/13/2021    Order Specific Question:   If indicated for the ordered procedure, I authorize the administration of a radiopharmaceutical per Radiology protocol    Answer:   Yes    Order Specific Question:   Preferred imaging location?    Answer:   [facility]   The patient has a good understanding of the overall plan. she agrees with it. she will call with any problems that may develop before the next visit here.  Total time spent: 45 mins including face to face time and time spent for planning, charting and coordination of care  Rulon Eisenmenger, MD, MPH 11/13/2020  I, Thana Ates, am acting as scribe for Dr. [name].  I have reviewed the above documentation for accuracy and completeness, and I agree with the above.

## 2020-11-13 ENCOUNTER — Inpatient Hospital Stay: Payer: Medicare Other

## 2020-11-13 ENCOUNTER — Encounter: Payer: Self-pay | Admitting: Hematology and Oncology

## 2020-11-13 ENCOUNTER — Other Ambulatory Visit: Payer: Self-pay

## 2020-11-13 ENCOUNTER — Inpatient Hospital Stay (HOSPITAL_BASED_OUTPATIENT_CLINIC_OR_DEPARTMENT_OTHER): Payer: Medicare Other | Admitting: Hematology and Oncology

## 2020-11-13 ENCOUNTER — Inpatient Hospital Stay: Payer: Medicare Other | Attending: Hematology and Oncology

## 2020-11-13 VITALS — BP 146/63 | HR 87 | Temp 97.7°F | Resp 19 | Ht 63.0 in | Wt 112.1 lb

## 2020-11-13 DIAGNOSIS — Z79899 Other long term (current) drug therapy: Secondary | ICD-10-CM | POA: Insufficient documentation

## 2020-11-13 DIAGNOSIS — Z170 Estrogen receptor positive status [ER+]: Secondary | ICD-10-CM | POA: Insufficient documentation

## 2020-11-13 DIAGNOSIS — Z9221 Personal history of antineoplastic chemotherapy: Secondary | ICD-10-CM | POA: Diagnosis not present

## 2020-11-13 DIAGNOSIS — C7931 Secondary malignant neoplasm of brain: Secondary | ICD-10-CM

## 2020-11-13 DIAGNOSIS — Z5112 Encounter for antineoplastic immunotherapy: Secondary | ICD-10-CM | POA: Diagnosis not present

## 2020-11-13 DIAGNOSIS — C50411 Malignant neoplasm of upper-outer quadrant of right female breast: Secondary | ICD-10-CM

## 2020-11-13 DIAGNOSIS — Z79811 Long term (current) use of aromatase inhibitors: Secondary | ICD-10-CM | POA: Insufficient documentation

## 2020-11-13 DIAGNOSIS — C7951 Secondary malignant neoplasm of bone: Secondary | ICD-10-CM

## 2020-11-13 DIAGNOSIS — Z9011 Acquired absence of right breast and nipple: Secondary | ICD-10-CM | POA: Diagnosis not present

## 2020-11-13 DIAGNOSIS — C50911 Malignant neoplasm of unspecified site of right female breast: Secondary | ICD-10-CM | POA: Diagnosis not present

## 2020-11-13 LAB — CMP (CANCER CENTER ONLY)
ALT: 26 U/L (ref 0–44)
AST: 64 U/L — ABNORMAL HIGH (ref 15–41)
Albumin: 3.4 g/dL — ABNORMAL LOW (ref 3.5–5.0)
Alkaline Phosphatase: 231 U/L — ABNORMAL HIGH (ref 38–126)
Anion gap: 9 (ref 5–15)
BUN: 18 mg/dL (ref 8–23)
CO2: 29 mmol/L (ref 22–32)
Calcium: 14.1 mg/dL (ref 8.9–10.3)
Chloride: 100 mmol/L (ref 98–111)
Creatinine: 1.19 mg/dL — ABNORMAL HIGH (ref 0.44–1.00)
GFR, Estimated: 48 mL/min — ABNORMAL LOW (ref >60)
Glucose, Bld: 117 mg/dL — ABNORMAL HIGH (ref 70–99)
Potassium: 3.4 mmol/L — ABNORMAL LOW (ref 3.5–5.1)
Sodium: 138 mmol/L (ref 135–145)
Total Bilirubin: 0.9 mg/dL (ref 0.3–1.2)
Total Protein: 7.4 g/dL (ref 6.5–8.1)

## 2020-11-13 LAB — CBC WITH DIFFERENTIAL (CANCER CENTER ONLY)
Abs Immature Granulocytes: 0.02 10*3/uL (ref 0.00–0.07)
Basophils Absolute: 0 10*3/uL (ref 0.0–0.1)
Basophils Relative: 1 %
Eosinophils Absolute: 0 10*3/uL (ref 0.0–0.5)
Eosinophils Relative: 1 %
HCT: 37.8 % (ref 36.0–46.0)
Hemoglobin: 12.8 g/dL (ref 12.0–15.0)
Immature Granulocytes: 0 %
Lymphocytes Relative: 8 %
Lymphs Abs: 0.5 10*3/uL — ABNORMAL LOW (ref 0.7–4.0)
MCH: 32.7 pg (ref 26.0–34.0)
MCHC: 33.9 g/dL (ref 30.0–36.0)
MCV: 96.4 fL (ref 80.0–100.0)
Monocytes Absolute: 0.4 10*3/uL (ref 0.1–1.0)
Monocytes Relative: 8 %
Neutro Abs: 4.8 10*3/uL (ref 1.7–7.7)
Neutrophils Relative %: 82 %
Platelet Count: 150 10*3/uL (ref 150–400)
RBC: 3.92 MIL/uL (ref 3.87–5.11)
RDW: 14.6 % (ref 11.5–15.5)
WBC Count: 5.8 10*3/uL (ref 4.0–10.5)
nRBC: 0 % (ref 0.0–0.2)

## 2020-11-13 MED ORDER — ZOLEDRONIC ACID 4 MG/100ML IV SOLN
4.0000 mg | Freq: Once | INTRAVENOUS | Status: AC
  Administered 2020-11-13: 4 mg via INTRAVENOUS

## 2020-11-13 MED ORDER — ZOLEDRONIC ACID 4 MG/100ML IV SOLN
INTRAVENOUS | Status: AC
  Filled 2020-11-13: qty 100

## 2020-11-13 MED ORDER — PROCHLORPERAZINE MALEATE 10 MG PO TABS: 10.0000 mg | ORAL_TABLET | Freq: Four times a day (QID) | ORAL | 1 refills | Status: DC | PRN

## 2020-11-13 MED ORDER — ACETAMINOPHEN 325 MG PO TABS
ORAL_TABLET | ORAL | Status: AC
  Filled 2020-11-13: qty 2

## 2020-11-13 MED ORDER — TRASTUZUMAB-DKST CHEMO 150 MG IV SOLR
300.0000 mg | Freq: Once | INTRAVENOUS | Status: AC
  Administered 2020-11-13: 300 mg via INTRAVENOUS
  Filled 2020-11-13: qty 14.29

## 2020-11-13 MED ORDER — HEPARIN SOD (PORK) LOCK FLUSH 100 UNIT/ML IV SOLN
500.0000 [IU] | Freq: Once | INTRAVENOUS | Status: AC | PRN
  Administered 2020-11-13: 500 [IU]
  Filled 2020-11-13: qty 5

## 2020-11-13 MED ORDER — ONDANSETRON HCL 8 MG PO TABS: 8.0000 mg | ORAL_TABLET | Freq: Two times a day (BID) | ORAL | 1 refills | Status: DC | PRN

## 2020-11-13 MED ORDER — SODIUM CHLORIDE 0.9% FLUSH
10.0000 mL | Freq: Once | INTRAVENOUS | Status: AC
  Administered 2020-11-13: 10 mL
  Filled 2020-11-13: qty 10

## 2020-11-13 MED ORDER — CYANOCOBALAMIN 1000 MCG/ML IJ SOLN
1000.0000 ug | Freq: Once | INTRAMUSCULAR | Status: AC
  Administered 2020-11-13: 1000 ug via INTRAMUSCULAR

## 2020-11-13 MED ORDER — LIDOCAINE-PRILOCAINE 2.5-2.5 % EX CREA: TOPICAL_CREAM | CUTANEOUS | 3 refills | Status: DC

## 2020-11-13 MED ORDER — SODIUM CHLORIDE 0.9 % IV SOLN
Freq: Once | INTRAVENOUS | Status: AC
  Filled 2020-11-13: qty 250

## 2020-11-13 MED ORDER — SODIUM CHLORIDE 0.9% FLUSH
10.0000 mL | INTRAVENOUS | Status: DC | PRN
  Administered 2020-11-13: 10 mL
  Filled 2020-11-13: qty 10

## 2020-11-13 MED ORDER — DEXAMETHASONE 4 MG PO TABS: 4.0000 mg | ORAL_TABLET | Freq: Every day | ORAL | 1 refills | Status: DC

## 2020-11-13 MED ORDER — CYANOCOBALAMIN 1000 MCG/ML IJ SOLN
INTRAMUSCULAR | Status: AC
  Filled 2020-11-13: qty 1

## 2020-11-13 MED ORDER — ACETAMINOPHEN 325 MG PO TABS
650.0000 mg | ORAL_TABLET | Freq: Once | ORAL | Status: AC
  Administered 2020-11-13: 650 mg via ORAL

## 2020-11-13 NOTE — Progress Notes (Signed)
OFF PATHWAY REGIMEN - Breast  No Change  Continue With Treatment as Ordered.  Original Decision Date/Time: 07/27/2017 09:32   [id number]:Pertuzumab 420 mg + Trastuzumab 6 mg/kg q21 Days:   A cycle is every 21 days:     Trastuzumab      Pertuzumab   **Always confirm dose/schedule in your pharmacy ordering system**  Patient Characteristics: Distant Metastases or Locoregional Recurrent Disease - Unresected, HER2 Positive, ER Positive, HER2-Targeted Therapy (Concurrent with Endocrine Therapy) Therapeutic Status: Distant Metastases BRCA Mutation Status: Absent ER Status: Positive (+) HER2 Status: Positive (+) PR Status: Negative (-) Intent of Therapy: Non-Curative / Palliative Intent, Discussed with Patient

## 2020-11-13 NOTE — Assessment & Plan Note (Addendum)
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 MRI 02/14/2015: T5 large destructive lesion with pathologic compression fracture and extensive epidural tumor, involvement of T4, excision metastatic carcinoma ER 60%, PR 0%, HER-2 positive ratio 2.71 average copy #7.45 03/27/2015: T4-6 decompression surgery for spinal cord compression PET-CT 12/10/17Rt para-spinal mass mildy hypermetabolic SUV 5.9, lytic cortical lesion 4.8 cm lesion left prox femur suv 8.8 favor osseus met disease Echo 04/12/2017 shows well preserved ejection fraction of 55-60%  Treatment summary: 1. Resumed anastrozole 05/21/2015, but it has caused significant hair loss so we switched her to exemestane 25 mg once daily 07/23/2015 2. Herceptin every 3 week started 04/10/2015 3. Bone metastases: Zometa every 3 months 4. Radiation therapy to the spine (Dr. Manning) 04/13/2015 to 05/19/2015 5. Radiation therapy to left femur 04/28/2016 to 05/12/2016 6. SRS to the brain lesion 11/24/2016 7.Kadcyla started 05/05/2017 discontinued 07/27/2017 8.Palbociclib +Faslodex +trastuzumab+ Pertuzumabstarted 08/10/2017, stopped 04/04/2018 9.XRT to T123/04/2019 10. Enhertu started 06/01/18-12/27/2019 stopped for progression 11SRS to brain January 2021 12.Lapatinib with anastrozole stopped for progression 13.  Xeloda, Tucatinib, Herceptin discontinued 10/24/2019 -------------------------------------------------------------------------------------------------------------------------- MRI Brain: 08/21/20: DIffuse calvarial and bone mets Bone Scan 08/20/20: Widespread bone mets, increased activity Rt humeral mets, no new bone mets CT CAP 08/20/20: mild prog of lytic bone mets some stable and some inc in size (ribs, T spine and  bone pelvis)  Treatment plan: Navelbine (25 mg per metered squared weekly), margetuximab (every 3 weeks)  Counseling: I discussed the risks and benefits of Navelbine as well as margetuximab.  Now all been associated hematological toxicities as well as neuropathy and pulmonary toxicities were discussed.  Margetuximab related infusion reactions as well as cardiovascular toxicities were discussed.  Return to clinic in 1 week to start treatment  

## 2020-11-13 NOTE — Progress Notes (Signed)
DISCONTINUE OFF PATHWAY REGIMEN - Breast   [id number]:Pertuzumab 420 mg + Trastuzumab 6 mg/kg q21 Days:   A cycle is every 21 days:     Trastuzumab      Pertuzumab   **Always confirm dose/schedule in your pharmacy ordering system**  REASON: Disease Progression PRIOR TREATMENT: Off Pathway: Pertuzumab 420 mg + Trastuzumab 6 mg/kg q21 Days TREATMENT RESPONSE: Unable to Evaluate  START ON PATHWAY REGIMEN - Breast     A cycle is every 21 days:     Vinorelbine      Margetuximab-cmkb   **Always confirm dose/schedule in your pharmacy ordering system**  Patient Characteristics: Distant Metastases or Locoregional Recurrent Disease - Unresected or Locally Advanced Unresectable Disease Progressing after Neoadjuvant and Local Therapies, HER2 Positive, ER Positive, Chemotherapy + HER2-Targeted Therapy, Fourth Line and Beyond,  Margetuximab + Chemotherapy Indicated Therapeutic Status: Distant Metastases ER Status: Positive (+) HER2 Status: Positive (+) PR Status: Negative (-) Line of Therapy: Fourth Line and Beyond Therapy Indicated: Margetuximab + Chemotherapy Indicated Intent of Therapy: Non-Curative / Palliative Intent, Discussed with Patient

## 2020-11-13 NOTE — Patient Instructions (Signed)
[facility]  Discharge Instructions: Thank you for choosing [facility] to provide your oncology and hematology care.   If you have a lab appointment with the [facility], please go directly to the [facility] and check in at the registration area.   Wear comfortable clothing and clothing appropriate for easy access to any Portacath or PICC line.   We strive to give you quality time with your provider. You may need to reschedule your appointment if you arrive late (15 or more minutes).  Arriving late affects you and other patients whose appointments are after yours.  Also, if you miss three or more appointments without notifying the office, you may be dismissed from the clinic at the provider's discretion.      For prescription refill requests, have your pharmacy contact our office and allow 72 hours for refills to be completed.    Today you received the following chemotherapy and/or immunotherapy agents Zoledronic Acid, Vitamin B-12 Injection, and Trastuzumab-dkst.      To help prevent nausea and vomiting after your treatment, we encourage you to take your nausea medication as directed.  BELOW ARE SYMPTOMS THAT SHOULD BE REPORTED IMMEDIATELY: *FEVER GREATER THAN 100.4 F (38 C) OR HIGHER *CHILLS OR SWEATING *NAUSEA AND VOMITING THAT IS NOT CONTROLLED WITH YOUR NAUSEA MEDICATION *UNUSUAL SHORTNESS OF BREATH *UNUSUAL BRUISING OR BLEEDING *URINARY PROBLEMS (pain or burning when urinating, or frequent urination) *BOWEL PROBLEMS (unusual diarrhea, constipation, pain near the anus) TENDERNESS IN MOUTH AND THROAT WITH OR WITHOUT PRESENCE OF ULCERS (sore throat, sores in mouth, or a toothache) UNUSUAL RASH, SWELLING OR PAIN  UNUSUAL VAGINAL DISCHARGE OR ITCHING   Items with * indicate a potential emergency and should be followed up as soon as possible or go to the Emergency Department if any problems should occur.  Please show the CHEMOTHERAPY  ALERT CARD or IMMUNOTHERAPY ALERT CARD at check-in to the Emergency Department and triage nurse.  Should you have questions after your visit or need to cancel or reschedule your appointment, please contact [facility]  Dept: [phone]  and follow the prompts.  Office hours are 8:00 a.m. to 4:30 p.m. Monday - Friday. Please note that voicemails left after 4:00 p.m. may not be returned until the following business day.  We are closed weekends and major holidays. You have access to a nurse at all times for urgent questions. Please call the main number to the clinic Dept: [phone] and follow the prompts.   For any non-urgent questions, you may also contact your provider using MyChart. We now offer e-Visits for anyone 28 and older to request care online for non-urgent symptoms. For details visit [email or web address].   Also download the MyChart app! Go to the app store, search "MyChart", open the app, select [facility], and log in with your MyChart username and password.  Due to Covid, a mask is required upon entering the hospital/clinic. If you do not have a mask, one will be given to you upon arrival. For doctor visits, patients may have 1 support person aged 51 or older with them. For treatment visits, patients cannot have anyone with them due to current Covid guidelines and our immunocompromised population.

## 2020-11-16 ENCOUNTER — Encounter: Payer: Self-pay | Admitting: *Deleted

## 2020-11-16 ENCOUNTER — Other Ambulatory Visit (HOSPITAL_COMMUNITY): Payer: Self-pay

## 2020-11-16 ENCOUNTER — Telehealth: Payer: Self-pay | Admitting: *Deleted

## 2020-11-16 NOTE — Telephone Encounter (Signed)
Revonda is asking about taking prednisone (daily) and decadron (daily for 2 days after margetuximab) at the same time. Please advise.  Wants to know if Mont Dutton talked with Dr [name] about MRI being done at [facility].

## 2020-11-17 ENCOUNTER — Encounter: Payer: Self-pay | Admitting: Hematology and Oncology

## 2020-11-17 NOTE — Telephone Encounter (Signed)
Called to discuss steroid management. Notified that Dr [name] did discuss MRI with Mont Dutton.  Chemo schedule needs to be corrected, message to scheduler.

## 2020-11-18 NOTE — Progress Notes (Signed)
Patient Care Team: [name], MD as PCP - General (Family Medicine) [name], MD as Consulting Physician (Hematology and Oncology) Bensimhon, Shaune Pascal, MD as Consulting Physician (Cardiology) Marletta Lor as Consulting Physician (Internal Medicine) Tyler Pita, MD as Consulting Physician (Radiation Oncology)  DIAGNOSIS:    ICD-10-CM   1. Primary cancer of upper outer quadrant of right female breast ([place])  C50.411       SUMMARY OF ONCOLOGIC HISTORY: Oncology History  Primary cancer of upper outer quadrant of right female breast ([place])  03/08/2012 Initial Diagnosis   Right breast invasive ductal carcinoma ER positive PR negative HER-2 positive Ki-67 44%; another breast mass biopsied in the anterior part of the breast which was also positive for malignancy that was HER-2 negative    03/14/2012 Cancer Staging   Staging form: Breast, AJCC 7th Edition - Pathologic: Stage IV (M1) - Signed by [name], NP on 06/22/2018    05/10/2012 Surgery   Right mastectomy and axillary lymph node dissection: Multifocal disease 5 cm, 1.7 cm, 1.6 cm, ER positive PR negative HER-2 positive Ki-67 44%, 3/7 lymph nodes positive    06/14/2012 - 06/12/2013 Chemotherapy   Adjuvant chemotherapy with Myton 6 followed by Herceptin maintenance    10/25/2012 - 12/11/2012 Radiation Therapy   Adjuvant radiation therapy    02/07/2013 -  Anti-estrogen oral therapy   Letrozole 2.5 mg daily    02/14/2015 Imaging   MRI spine: Large destructive T5 lesion with severe pathologic compression fracture and extensive epidural tumor, severe spinal stenosis with moderate cord compression, tumor involvement of T4    03/18/2015 PET scan   Residual enhancing soft tissue adjacent to the spinal cord. No evidence of metastatic disease. Nonspecific uptake the left nipple    03/27/2015 - 03/30/2015 Hospital Admission   T4-6 decompression for spinal cord compression (lower extremity paralysis)     04/10/2015 -  Chemotherapy   Palliative treatment with Herceptin every 3 weeks with letrozole 2.5 mg daily    04/13/2015 - 05/19/2015 Radiation Therapy   Palliative radiation treatment to the spine    09/01/2015 Imaging   CT chest abdomen pelvis: Pathologic fracture with posterior fusion at T5 no other evidence of metastatic disease in the chest abdomen pelvis    12/23/2015 Imaging   CT CAP: new nonspecific 0.6 cm lymph node was stated mediastinum needs follow-up CT, innumerable tiny groundglass pulmonary nodules throughout both lungs unchanged, patchy consolidation from radiation, T5 fracture, no mets    04/15/2016 PET scan   Rt para-spinal mass mildy hypermetabolic SUV 5.9, lytic cortical lesion 4.8 cm lesion left prox femur suv 8.8 favor osseus met disease    04/28/2016 - 05/12/2016 Radiation Therapy   Palliative XRT Left femur    05/24/2016 Procedure   Soft tissue mass biopsy back: Metastatic breast cancer ER 80%, PR 0%, Ki-67 50%, HER-2 positive ratio 2.25    06/09/2016 - 03/29/2017 Anti-estrogen oral therapy   Faslodex with Herceptin and Perjeta every 4 weeks    06/13/2016 - 06/15/2016 Hospital Admission   uncomplicated revision of previous thoracic instrumentation.    10/10/2016 PET scan   Interval development of new hypermetabolic 2.5 x 1.1 cm lesion posterior right eighth rib consistent with metastatic disease, stable right paraspinal lesion at T8, clustered soft tissue nodules in the posterior midline back near cervicothoracic junction smaller than before     11/03/2016 - 11/07/2016 Radiation Therapy   Palliative radiation to right eighth rib    11/04/2016 Imaging   Solitary  contrast-enhancing lesion in the right cerebellum 6 x 2 mm concerning for metastatic lesion    11/24/2016 - 11/24/2016 Radiation Therapy   SRS to the brain lesion    04/18/2017 PET scan   New ill-defined rounded airspace opacity in posterior right lower lobe. Differential diagnosis includes malignancy  and infectious/inflammatory process.Increased hypermetabolic lytic lesion in T8 vertebral body, consistent with bone metastasis.  Increased hypermetabolic posterior upper chest wall subcutaneous nodules, highly suspicious metastatic disease. Further improvement in right posterior eighth rib metastasis.    05/04/2017 - 07/27/2017 Chemotherapy   Kadcyla every 3 weeks palliative chemotherapy stopped for progression    07/26/2017 Imaging   Interval increase in size of the multiple enhancing masses posteriorly overlying the spine, interval development of left axillary and mediastinal adenopathy, increase in the size of lytic lesions in T8-T9 and left ninth rib, increased groundglass opacities right lower lobe lung    07/27/2017 - 05/11/2018 Chemotherapy   Palbociclib + Faslodex + trastuzumab + Pertuzumab     04/03/2018 PET scan   PET CT scan showed progression of disease not only in the bones but also in the subcutaneous nodules.    05/15/2018 - 05/25/2018 Radiation Therapy   Palliative radiation to the back    06/01/2018 - 12/27/2019 Chemotherapy   Enhertu (Transtuzumab Deruxtecan)     08/07/2018 - 08/07/2018 Radiation Therapy   T12 vertebral body SRS    01/2020 - 10/23/2020 Chemotherapy    Xeloda, Tucatinib, Herceptin    11/19/2020 -  Chemotherapy    Patient is on Treatment Plan: BREAST MARGETUXIMAB + VINORELBINE Q21D   Patient is on Antibody Plan: BREAST TRASTUZUMAB Q21D     Carcinoma of right breast, stage 4 ([place])  02/14/2015 Initial Diagnosis   Breast cancer metastasized to bone, right ([place])    06/01/2018 - 02/07/2020 Chemotherapy   The patient had palonosetron (ALOXI) injection 0.25 mg, 0.25 mg, Intravenous,  Once, 24 of 30 cycles Administration: 0.25 mg (06/01/2018), 0.25 mg (06/22/2018), 0.25 mg (08/24/2018), 0.25 mg (07/13/2018), 0.25 mg (08/03/2018), 0.25 mg (09/14/2018), 0.25 mg (10/05/2018), 0.25 mg (11/16/2018), 0.25 mg (10/26/2018), 0.25 mg (12/07/2018), 0.25 mg (12/28/2018), 0.25 mg  (01/18/2019), 0.25 mg (03/15/2019), 0.25 mg (02/15/2019), 0.25 mg (04/12/2019), 0.25 mg (05/09/2019), 0.25 mg (06/07/2019), 0.25 mg (07/05/2019), 0.25 mg (08/09/2019), 0.25 mg (09/13/2019), 0.25 mg (10/18/2019), 0.25 mg (11/22/2019), 0.25 mg (12/27/2019) fam-trastuzumab deruxtecan-nxki (ENHERTU) 332 mg in dextrose 5 % 100 mL chemo infusion, 5.4 mg/kg = 332 mg, Intravenous,  Once, 24 of 30 cycles Dose modification: 4.4 mg/kg (original dose 5.4 mg/kg, Cycle 7, Reason: Provider Judgment), 3.2 mg/kg (original dose 5.4 mg/kg, Cycle 9, Reason: Dose not tolerated), 150 mg (original dose 5.4 mg/kg, Cycle 18, Reason: Dose not tolerated) Administration: 332 mg (06/01/2018), 332 mg (06/22/2018), 332 mg (07/13/2018), 332 mg (08/24/2018), 332 mg (08/03/2018), 332 mg (09/14/2018), 272 mg (10/05/2018), 200 mg (11/16/2018), 272 mg (10/26/2018), 200 mg (12/07/2018), 200 mg (12/28/2018), 200 mg (01/18/2019), 200 mg (03/15/2019), 200 mg (02/15/2019), 200 mg (04/12/2019), 200 mg (05/09/2019), 200 mg (06/07/2019), 150 mg (07/05/2019), 150 mg (08/09/2019), 150 mg (09/13/2019), 150 mg (10/18/2019), 150 mg (11/22/2019), 150 mg (12/27/2019)   for chemotherapy treatment.     05/29/2020 - 11/13/2020 Chemotherapy    Patient is on Treatment Plan: BREAST MARGETUXIMAB + VINORELBINE Q21D       Metastatic cancer to spine [facility])  Brain metastasis ([place])  11/18/2016 Initial Diagnosis   Brain metastasis ([place])    05/29/2020 - 11/13/2020 Chemotherapy    Patient is on Treatment Plan: BREAST  MARGETUXIMAB + VINORELBINE Q21D         CHIEF COMPLIANT: Cycle 1 margetuximab and vinorelbine  INTERVAL HISTORY: Heather [name] is a 75 y.o. with above-mentioned history of metastatic breast cancer HER2 positive disease who is here for treatment with margetuximab and vinorelbine.  She presents to the clinic today for treatment.  She is anxious about starting medical treatment.  But she is worried about weight loss.  ALLERGIES:  is allergic to other, acyclovir and related,  brompheniramine, chlorhexidine, dextromethorphan, monascus purpureus went yeast, phenylephrine hcl, pravastatin sodium, tegaderm ag mesh [silver], zanaflex [tizanidine hcl], codeine, keflex [cephalexin], naprosyn [naproxen], oxycodone, tramadol, and tussin [guaifenesin].  MEDICATIONS:  Current Outpatient Medications  Medication Sig Dispense Refill   acetaminophen (TYLENOL) 500 MG tablet Take 500 mg by mouth every 6 (six) hours as needed.     ALPRAZolam (XANAX) 0.25 MG tablet Take 1 tablet by mouth as needed.     amoxicillin (AMOXIL) 500 MG capsule Take 1 tablet ( 500 mg) , 2 times per day for 7 days.  Begin day for dental procedure (Patient not taking: Reported on 11/13/2020) 14 capsule 0   BOOSTRIX 5-2.5-18.5 LF-MCG/0.5 injection      Cholecalciferol (VITAMIN D) 2000 UNITS tablet Take 2,000 Units by mouth daily.     COLLAGEN PO Take by mouth.     Cranberry 250 MG CAPS Take by mouth.     dexamethasone (DECADRON) 4 MG tablet Take 1 tablet (4 mg total) by mouth daily. Take for 2 days after margetuximab chemo. Take with breakfast. 30 tablet 1   famotidine (PEPCID) 40 MG tablet Take 40 mg by mouth daily.     gabapentin (NEURONTIN) 600 MG tablet Take 600 mg by mouth 2 (two) times daily.     HYDROcodone-acetaminophen (NORCO/VICODIN) 5-325 MG tablet Take 1 tablet by mouth every 6 (six) hours as needed for moderate pain. 30 tablet 0   lidocaine-prilocaine (EMLA) cream Apply to affected area once 30 g 3   ondansetron (ZOFRAN) 8 MG tablet Take 1 tablet (8 mg total) by mouth 2 (two) times daily as needed. Start on the third day after margetuximab chemo. 30 tablet 1   OVER THE COUNTER MEDICATION Place 1 drop into both eyes 2 (two) times daily as needed (dry eyes). Over the counter lubricating eye drops     polyethylene glycol (MIRALAX / GLYCOLAX) packet Take 17 g by mouth daily as needed.     predniSONE (DELTASONE) 10 MG tablet Take 1 tablet (10 mg total) by mouth daily with breakfast. 30 tablet 1    prochlorperazine (COMPAZINE) 10 MG tablet Take 1 tablet (10 mg total) by mouth every 6 (six) hours as needed (Nausea or vomiting). 30 tablet 1   spironolactone (ALDACTONE) 25 MG tablet Take 25 mg by mouth daily.     No current facility-administered medications for this visit.   Facility-Administered Medications Ordered in Other Visits  Medication Dose Route Frequency Provider Last Rate Last Admin   cyanocobalamin ((VITAMIN B-12)) injection 1,000 mcg  1,000 mcg Intramuscular Q6 weeks [name], MD   1,000 mcg at 03/19/20 1110   heparin lock flush 100 unit/mL  500 Units Intracatheter Once [name], MD       sodium chloride flush (NS) 0.9 % injection 10 mL  10 mL Intracatheter Once [name], MD        PHYSICAL EXAMINATION: ECOG PERFORMANCE STATUS: 1 - Symptomatic but completely ambulatory  Vitals:   11/19/20 1203  BP: 131/65  Pulse: 80  Resp: 17  Temp: 97.6 F (36.4 C)  SpO2: 99%   Filed Weights   11/19/20 1203  Weight: 114 lb (51.7 kg)    LABORATORY DATA:  I have reviewed the data as listed CMP Latest Ref Rng & Units 11/13/2020 10/23/2020 10/02/2020  Glucose 70 - 99 mg/dL 117(H) 98 97  BUN 8 - 23 mg/dL $Remove'18 17 14  'LYLdtGj$ Creatinine 0.44 - 1.00 mg/dL 1.19(H) 1.17(H) 1.19(H)  Sodium 135 - 145 mmol/L 138 135 139  Potassium 3.5 - 5.1 mmol/L 3.4(L) 3.8 3.7  Chloride 98 - 111 mmol/L 100 98 102  CO2 22 - 32 mmol/L $RemoveB'29 26 28  'jBUtLuxJ$ Calcium 8.9 - 10.3 mg/dL 14.1(HH) 11.9(H) 12.9(H)  Total Protein 6.5 - 8.1 g/dL 7.4 7.4 7.3  Total Bilirubin 0.3 - 1.2 mg/dL 0.9 0.9 1.1  Alkaline Phos 38 - 126 U/L 231(H) 212(H) 193(H)  AST 15 - 41 U/L 64(H) 53(H) 51(H)  ALT 0 - 44 U/L 26 33 19    Lab Results  Component Value Date   WBC 5.8 11/13/2020   HGB 12.8 11/13/2020   HCT 37.8 11/13/2020   MCV 96.4 11/13/2020   PLT 150 11/13/2020   NEUTROABS 4.8 11/13/2020    ASSESSMENT & PLAN:  Primary cancer of upper outer quadrant of right female breast ([place]) Right breast invasive ductal carcinoma ER  positive PR negative HER-2 positive Ki-67 44% multifocal disease 3/7 lymph nodes positive T2 N1 M0 stage IIB status post adjuvant chemotherapy with TCH followed by Herceptin maintenance, adjuvant radiation therapy and Letrozole. MRI 02/14/2015: T5 large destructive lesion with pathologic compression fracture and extensive epidural tumor, involvement of T4, excision metastatic carcinoma ER 60%, PR 0%, HER-2 positive ratio 2.71 average copy #7.45 03/27/2015: T4-6 decompression surgery for spinal cord compression PET-CT 04/17/16 Rt para-spinal mass mildy hypermetabolic SUV 5.9, lytic cortical lesion 4.8 cm lesion left prox femur suv 8.8 favor osseus met disease Echo 04/12/2017 shows well preserved ejection fraction of 55-60%    Treatment summary: 1. Resumed anastrozole 05/21/2015, but it has caused significant hair loss so we switched her to exemestane 25 mg once daily 07/23/2015 2. Herceptin every 3 week started 04/10/2015 3. Bone metastases: Zometa every 3 months 4. Radiation therapy to the spine (Dr. [name]) 04/13/2015 to 05/19/2015 5. Radiation therapy to left femur 04/28/2016 to 05/12/2016 6. SRS to the brain lesion 11/24/2016 7.  Kadcyla started 05/05/2017 discontinued 07/27/2017 8.Palbociclib + Faslodex + trastuzumab + Pertuzumab started 08/10/2017, stopped 04/04/2018 9. XRT to T12 07/19/2018 10. Enhertu started 06/01/18-12/27/2019 stopped for progression 11 SRS to brain January 2021 12.  Lapatinib with anastrozole stopped for progression 13.  Xeloda, Tucatinib, Herceptin -------------------------------------------------------------------------------------------------------------------------- Current treatment: Margetuximab with Navelbine  Patient will receive Navelbine days 1 and 8 every 3 weeks and margetuximab on day 1 Patient has a brain MRI and possibly she will need stereotactic radiosurgery. She is planning to go to [facility] for [facility]mology evaluation.  Return to clinic in 1 week for  toxicity check    No orders of the defined types were placed in this encounter.  The patient has a good understanding of the overall plan. she agrees with it. she will call with any problems that may develop before the next visit here.  Total time spent: 30 mins including face to face time and time spent for planning, charting and coordination of care  Rulon Eisenmenger, MD, MPH 11/19/2020  I, Thana Ates, am acting as scribe for Dr. [name].  I have reviewed the above documentation for  accuracy and completeness, and I agree with the above.       

## 2020-11-18 NOTE — Progress Notes (Signed)
Pharmacist Chemotherapy Monitoring - Initial Assessment    Anticipated start date: 11/19/20  The following has been reviewed per standard work regarding the patient's treatment regimen: The patient's diagnosis, treatment plan and drug doses, and organ/hematologic function Lab orders and baseline tests specific to treatment regimen  The treatment plan start date, drug sequencing, and pre-medications Prior authorization status  Patient's documented medication list, including drug-drug interaction screen and prescriptions for anti-emetics and supportive care specific to the treatment regimen The drug concentrations, fluid compatibility, administration routes, and timing of the medications to be used The patient's access for treatment and lifetime cumulative dose history, if applicable  The patient's medication allergies and previous infusion related reactions, if applicable   Changes made to treatment plan:  treatment plan date  Follow up needed:  N/A  [name] aware pt received Ogivri on 11/13/20.  OK to proceed w/ starting Margenza on 11/19/20.  Kennith Center, Pharm.D., CPP 11/18/2020@4 :15 PM

## 2020-11-19 ENCOUNTER — Inpatient Hospital Stay: Payer: Medicare Other

## 2020-11-19 ENCOUNTER — Inpatient Hospital Stay (HOSPITAL_BASED_OUTPATIENT_CLINIC_OR_DEPARTMENT_OTHER): Payer: Medicare Other | Admitting: Hematology and Oncology

## 2020-11-19 ENCOUNTER — Other Ambulatory Visit: Payer: Self-pay | Admitting: Pharmacist

## 2020-11-19 ENCOUNTER — Other Ambulatory Visit: Payer: Self-pay

## 2020-11-19 DIAGNOSIS — Z170 Estrogen receptor positive status [ER+]: Secondary | ICD-10-CM | POA: Diagnosis not present

## 2020-11-19 DIAGNOSIS — C50411 Malignant neoplasm of upper-outer quadrant of right female breast: Secondary | ICD-10-CM

## 2020-11-19 DIAGNOSIS — C7951 Secondary malignant neoplasm of bone: Secondary | ICD-10-CM

## 2020-11-19 DIAGNOSIS — C7931 Secondary malignant neoplasm of brain: Secondary | ICD-10-CM | POA: Diagnosis not present

## 2020-11-19 DIAGNOSIS — Z9221 Personal history of antineoplastic chemotherapy: Secondary | ICD-10-CM | POA: Diagnosis not present

## 2020-11-19 DIAGNOSIS — Z5112 Encounter for antineoplastic immunotherapy: Secondary | ICD-10-CM | POA: Diagnosis not present

## 2020-11-19 LAB — CBC WITH DIFFERENTIAL (CANCER CENTER ONLY)
Abs Immature Granulocytes: 0.06 10*3/uL (ref 0.00–0.07)
Basophils Absolute: 0 10*3/uL (ref 0.0–0.1)
Basophils Relative: 0 %
Eosinophils Absolute: 0 10*3/uL (ref 0.0–0.5)
Eosinophils Relative: 0 %
HCT: 36.2 % (ref 36.0–46.0)
Hemoglobin: 12.2 g/dL (ref 12.0–15.0)
Immature Granulocytes: 1 %
Lymphocytes Relative: 8 %
Lymphs Abs: 0.5 10*3/uL — ABNORMAL LOW (ref 0.7–4.0)
MCH: 32 pg (ref 26.0–34.0)
MCHC: 33.7 g/dL (ref 30.0–36.0)
MCV: 95 fL (ref 80.0–100.0)
Monocytes Absolute: 0.3 10*3/uL (ref 0.1–1.0)
Monocytes Relative: 5 %
Neutro Abs: 5.1 10*3/uL (ref 1.7–7.7)
Neutrophils Relative %: 86 %
Platelet Count: 197 10*3/uL (ref 150–400)
RBC: 3.81 MIL/uL — ABNORMAL LOW (ref 3.87–5.11)
RDW: 14.5 % (ref 11.5–15.5)
WBC Count: 6 10*3/uL (ref 4.0–10.5)
nRBC: 0 % (ref 0.0–0.2)

## 2020-11-19 LAB — CMP (CANCER CENTER ONLY)
ALT: 34 U/L (ref 0–44)
AST: 97 U/L — ABNORMAL HIGH (ref 15–41)
Albumin: 3.4 g/dL — ABNORMAL LOW (ref 3.5–5.0)
Alkaline Phosphatase: 236 U/L — ABNORMAL HIGH (ref 38–126)
Anion gap: 10 (ref 5–15)
BUN: 20 mg/dL (ref 8–23)
CO2: 26 mmol/L (ref 22–32)
Calcium: 12.4 mg/dL — ABNORMAL HIGH (ref 8.9–10.3)
Chloride: 101 mmol/L (ref 98–111)
Creatinine: 0.83 mg/dL (ref 0.44–1.00)
GFR, Estimated: >60 mL/min (ref >60)
Glucose, Bld: 119 mg/dL — ABNORMAL HIGH (ref 70–99)
Potassium: 4 mmol/L (ref 3.5–5.1)
Sodium: 137 mmol/L (ref 135–145)
Total Bilirubin: 0.7 mg/dL (ref 0.3–1.2)
Total Protein: 7.7 g/dL (ref 6.5–8.1)

## 2020-11-19 MED ORDER — SODIUM CHLORIDE 0.9 % IV SOLN
15.0000 mg/kg | Freq: Once | INTRAVENOUS | Status: AC
  Administered 2020-11-19: 750 mg via INTRAVENOUS
  Filled 2020-11-19: qty 30

## 2020-11-19 MED ORDER — SODIUM CHLORIDE 0.9% FLUSH
10.0000 mL | INTRAVENOUS | Status: DC | PRN
  Administered 2020-11-19: 10 mL
  Filled 2020-11-19: qty 10

## 2020-11-19 MED ORDER — VINORELBINE TARTRATE CHEMO INJECTION 50 MG/5ML
20.0000 mg/m2 | Freq: Once | INTRAVENOUS | Status: AC
  Administered 2020-11-19: 30 mg via INTRAVENOUS
  Filled 2020-11-19: qty 3

## 2020-11-19 MED ORDER — HEPARIN SOD (PORK) LOCK FLUSH 100 UNIT/ML IV SOLN
500.0000 [IU] | Freq: Once | INTRAVENOUS | Status: AC | PRN
  Administered 2020-11-19: 500 [IU]
  Filled 2020-11-19: qty 5

## 2020-11-19 MED ORDER — ACETAMINOPHEN 325 MG PO TABS
650.0000 mg | ORAL_TABLET | Freq: Once | ORAL | Status: AC
  Administered 2020-11-19: 650 mg via ORAL

## 2020-11-19 MED ORDER — SODIUM CHLORIDE 0.9 % IV SOLN
10.0000 mg | Freq: Once | INTRAVENOUS | Status: AC
  Administered 2020-11-19: 10 mg via INTRAVENOUS
  Filled 2020-11-19: qty 10

## 2020-11-19 MED ORDER — PALONOSETRON HCL INJECTION 0.25 MG/5ML
INTRAVENOUS | Status: AC
  Filled 2020-11-19: qty 5

## 2020-11-19 MED ORDER — PALONOSETRON HCL INJECTION 0.25 MG/5ML
0.2500 mg | Freq: Once | INTRAVENOUS | Status: AC
  Administered 2020-11-19: 0.25 mg via INTRAVENOUS

## 2020-11-19 MED ORDER — ACETAMINOPHEN 325 MG PO TABS
ORAL_TABLET | ORAL | Status: AC
  Filled 2020-11-19: qty 2

## 2020-11-19 MED ORDER — SODIUM CHLORIDE 0.9% FLUSH
10.0000 mL | Freq: Once | INTRAVENOUS | Status: AC
  Administered 2020-11-19: 10 mL
  Filled 2020-11-19: qty 10

## 2020-11-19 MED ORDER — SODIUM CHLORIDE 0.9 % IV SOLN
Freq: Once | INTRAVENOUS | Status: AC
  Filled 2020-11-19: qty 250

## 2020-11-19 NOTE — Progress Notes (Signed)
Per Dr Blair Dolphin to proceed with margetuximab today

## 2020-11-19 NOTE — Patient Instructions (Signed)
[facility]   Discharge Instructions: Thank you for choosing [facility] to provide your oncology and hematology care.   If you have a lab appointment with the [facility], please go directly to the [facility] and check in at the registration area.   Wear comfortable clothing and clothing appropriate for easy access to any Portacath or PICC line.   We strive to give you quality time with your provider. You may need to reschedule your appointment if you arrive late (15 or more minutes).  Arriving late affects you and other patients whose appointments are after yours.  Also, if you miss three or more appointments without notifying the office, you may be dismissed from the clinic at the provider's discretion.      For prescription refill requests, have your pharmacy contact our office and allow 72 hours for refills to be completed.    Today you received the following chemotherapy and/or immunotherapy agents: vinorelbine and margetuximab.      To help prevent nausea and vomiting after your treatment, we encourage you to take your nausea medication as directed.  BELOW ARE SYMPTOMS THAT SHOULD BE REPORTED IMMEDIATELY: *FEVER GREATER THAN 100.4 F (38 C) OR HIGHER *CHILLS OR SWEATING *NAUSEA AND VOMITING THAT IS NOT CONTROLLED WITH YOUR NAUSEA MEDICATION *UNUSUAL SHORTNESS OF BREATH *UNUSUAL BRUISING OR BLEEDING *URINARY PROBLEMS (pain or burning when urinating, or frequent urination) *BOWEL PROBLEMS (unusual diarrhea, constipation, pain near the anus) TENDERNESS IN MOUTH AND THROAT WITH OR WITHOUT PRESENCE OF ULCERS (sore throat, sores in mouth, or a toothache) UNUSUAL RASH, SWELLING OR PAIN  UNUSUAL VAGINAL DISCHARGE OR ITCHING   Items with * indicate a potential emergency and should be followed up as soon as possible or go to the Emergency Department if any problems should occur.  Please show the CHEMOTHERAPY ALERT CARD or IMMUNOTHERAPY  ALERT CARD at check-in to the Emergency Department and triage nurse.  Should you have questions after your visit or need to cancel or reschedule your appointment, please contact [facility]  Dept: [phone]  and follow the prompts.  Office hours are 8:00 a.m. to 4:30 p.m. Monday - Friday. Please note that voicemails left after 4:00 p.m. may not be returned until the following business day.  We are closed weekends and major holidays. You have access to a nurse at all times for urgent questions. Please call the main number to the clinic Dept: [phone] and follow the prompts.   For any non-urgent questions, you may also contact your provider using MyChart. We now offer e-Visits for anyone 70 and older to request care online for non-urgent symptoms. For details visit [email or web address].   Also download the MyChart app! Go to the app store, search "MyChart", open the app, select [facility], and log in with your MyChart username and password.  Due to Covid, a mask is required upon entering the hospital/clinic. If you do not have a mask, one will be given to you upon arrival. For doctor visits, patients may have 1 support person aged 58 or older with them. For treatment visits, patients cannot have anyone with them due to current Covid guidelines and our immunocompromised population.   Vinorelbine injection What is this medication? VINORELBINE (vi NOR el been) is a chemotherapy drug. It targets fast dividing cells, like cancer cells, and causes these cells to die. This medicine is usedto treat lung cancer. This medicine may be used for other purposes; ask your  health care provider orpharmacist if you have questions. COMMON BRAND NAME(S): Navelbine What should I tell my care team before I take this medication? They need to know if you have any of these conditions: blockage in your bowel liver disease low blood counts, like low white cell, platelet, or red cell  counts lung or breathing disease, like asthma tingling of the fingers or toes, or other nerve disorder an unusual or allergic reaction to vinorelbine, other chemotherapy agents, other medicines, foods, dyes, or preservatives pregnant or trying to get pregnant breast-feeding How should I use this medication? This medicine is for infusion into a vein. It is given by a health careprovider in a hospital or clinic setting. Talk to your pediatrician regarding the use of this medicine in children.Special care may be needed. Overdosage: If you think you have taken too much of this medicine contact apoison control center or emergency room at once. NOTE: This medicine is only for you. Do not share this medicine with others. What if I miss a dose? It is important not to miss your dose. Call your doctor or health care providerif you are unable to keep an appointment. What may interact with this medication? Do not take this medicine with any of the following medications: live virus vaccines This medicine may also interact with the following medications: certain antibiotics such as erythromycin or clarithromycin certain antivirals for HIV or hepatitis certain medicines for fungal infections such as fluconazole, ketoconazole, itraconazole, posaconazole, or voriconazole cimetidine ciprofloxacin conivaptan crizotinib cyclosporine diltiazem dronedarone fluvoxamine grapefruit juice idelalisib imatinib nefazodone nelfinavir verapamil This list may not describe all possible interactions. Give your health care provider a list of all the medicines, herbs, non-prescription drugs, or dietary supplements you use. Also tell them if you smoke, drink alcohol, or use illegaldrugs. Some items may interact with your medicine. What should I watch for while using this medication? Your condition will be monitored carefully while you are receiving thismedicine. This medicine may make you feel generally unwell. This  is not uncommon as chemotherapy can affect healthy cells as well as cancer cells. Report any side effects. Continue your course of treatment even though you feel ill unless yourhealth care provider tells you to stop. Do not become pregnant while taking this medicine or for 6 months after stopping it. Women should inform their health care provider if they wish to become pregnant or think they might be pregnant. Men should not father a child while taking this medicine and for 3 months after stopping it. There is potential for serious side effects to an unborn child. Talk to your health care provider for more information. Do not breast-feed while taking this medicine orfor 9 days after stopping it. This may make it more difficult to father a child. Talk to your health careprovider if you are concerned about your fertility. This medicine will cause constipation. Try to have a bowel movement at least every 2 to 3 days. If you do not have a bowel movement for 3 days, call yourhealth care provider. This medicine may increase your risk of getting an infection. Call your health care provider for advice if you get a fever, chills, or sore throat, or other symptoms of a cold or flu. Do not treat yourself. Try to avoid being around people who are sick. Call your health care provider if you are around anyone with measles, chickenpox, or if you develop sores or blisters that do not healproperly. You may need blood work done while you are taking  this medicine. Avoid taking medicines that contain aspirin, acetaminophen, ibuprofen, naproxen, or ketoprofen unless instructed by your health care provider. Thesemedicines may hide a fever. Be careful brushing or flossing your teeth or using a toothpick because you may get an infection or bleed more easily. If you have any dental work done, Primary school teacher you are receiving this medicine. What side effects may I notice from receiving this medication? Side effects that you should  report to your doctor or health care professionalas soon as possible: allergic reactions like skin rash, itching or hives; swelling of the face, lips, or tongue breathing problems constipation pain, redness, or irritation at site where injected signs and symptoms of bleeding such as bloody or black, tarry stools; red or dark brown urine; spitting up blood or brown material that looks like coffee grounds; red spots on the skin; unusual bruising or bleeding from the eyes, gums, or nose signs and symptoms of infection like fever; chills; cough; sore throat; pain or trouble passing urine signs and symptoms of liver injury like dark yellow or brown urine; general ill feeling or flu-like symptoms; light-colored stools; loss of appetite; nausea; right upper belly pain; unusually weak or tired; yellowing of the eyes or skin signs and symptoms of low red blood cells or anemia such as unusually weak or tired; feeling faint or lightheaded; falls; breathing problems tingling, numbness in the hands or feet Side effects that usually do not require medical attention (report to yourdoctor or health care professional if they continue or are bothersome): jaw pain loss of appetite nausea, vomiting stomach pain This list may not describe all possible side effects. Call your doctor for medical advice about side effects. You may report side effects to FDA at1-800-FDA-1088. Where should I keep my medication? This drug is given in a hospital or clinic and will not be stored at home. NOTE: This sheet is a summary. It may not cover all possible information. If you have questions about this medicine, talk to your doctor, pharmacist, orhealth care provider.  2022 [employer]/Gold Standard (2019-04-24 17:40:18)  Margetuximab Injection What is this medication? MARGETUXIMAB (MAR je TUX I mab) is a monoclonal antibody. It treats breastcancer. This medicine may be used for other purposes; ask your health care provider  orpharmacist if you have questions. COMMON BRAND NAME(S): MARGENZA What should I tell my care team before I take this medication? They need to know if you have any of these conditions: heart failure an unusual or allergic reaction to margetuximab, other medicines, foods, dyes, or preservatives pregnant or trying to get pregnant breast-feeding How should I use this medication? This medicine is injected into a vein. It is given by a health care provider ina hospital or clinic setting. Talk to your health care provider about the use of this medicine in children.Special care may be needed. Overdosage: If you think you have taken too much of this medicine contact apoison control center or emergency room at once. NOTE: This medicine is only for you. Do not share this medicine with others. What if I miss a dose? Keep appointments for follow-up doses. It is important not to miss your dose.Call your health care provider if you are unable to keep an appointment. What may interact with this medication? certain types of chemotherapy, such as daunorubicin, doxorubicin, epirubicin, and idarubicin This list may not describe all possible interactions. Give your health care provider a list of all the medicines, herbs, non-prescription drugs, or dietary supplements you use. Also tell them if you  smoke, drink alcohol, or use illegaldrugs. Some items may interact with your medicine. What should I watch for while using this medication? Visit your health care provider for regular checks on your progress. Tell your health care provider if your symptoms do not start to get better or if they getworse. Your condition will be monitored carefully while you are receiving thismedicine. Do not become pregnant while taking this medicine or for 4 months after stopping it. Women should inform their health care provider if they wish to become pregnant or think they might be pregnant. There is potential for serious harm to an  unborn child. Talk to your health care provider for more information. Do not breast-feed an infant while taking this medicine or for 11months after stopping it. What side effects may I notice from receiving this medication? Side effects that you should report to your doctor or health care professionalas soon as possible: allergic reactions (skin rash, itching or hives; swelling of the face, lips, or tongue) cough fever heart failure (trouble breathing; fast, irregular heartbeat; sudden weight gain; swelling of the ankles, feet, hands; unusually weak or tired) low blood pressure (dizziness; feeling faint or lightheaded, falls; unusually weak or tired) redness, blistering, peeling, bleeding, or swelling of the skin on the palms of your hands or soles of your feet Side effects that usually do not require medical attention (report these toyour doctor or health care professional if they continue or are bothersome): constipation diarrhea hair loss loss of appetite muscle cramps, pain nausea pain, tingling, numbness in the hands or feet vomiting This list may not describe all possible side effects. Call your doctor for medical advice about side effects. You may report side effects to FDA at1-800-FDA-1088. Where should I keep my medication? This medicine is given in a hospital or clinic. It will not be stored at home. NOTE: This sheet is a summary. It may not cover all possible information. If you have questions about this medicine, talk to your doctor, pharmacist, orhealth care provider.  2022 Elsevier/Gold Standard (2019-07-08 08:49:36)

## 2020-11-19 NOTE — Progress Notes (Signed)
Heather [name]:    He[place], [place] [address]   DIAGNOSIS: Cancer Staging Primary cancer of upper outer quadrant of right female breast [facility]) Staging form: Breast, AJCC 7th Edition - Clinical: Stage IIB (T2, N1, cM0) - Unsigned Specimen type: Core Needle Biopsy Histopathologic type: 9931 Laterality: Right Staging comments: Staged at breast conference 11.6.13  - Pathologic: Stage IV (M1) - Signed by [name], NP on 06/22/2018 Specimen type: Core Needle Biopsy Histopathologic type: 9931 Laterality: Right   SUMMARY OF ONCOLOGIC HISTORY: Oncology History  Primary cancer of upper outer quadrant of right female breast ([place])  03/08/2012 Initial Diagnosis   Right breast invasive ductal carcinoma ER positive PR negative HER-2 positive Ki-67 44%; another breast mass biopsied in the anterior part of the breast which was also positive for malignancy that was HER-2 negative    03/14/2012 Cancer Staging   Staging form: Breast, AJCC 7th Edition - Pathologic: Stage IV (M1) - Signed by [name], NP on 06/22/2018    05/10/2012 Surgery   Right mastectomy and axillary lymph node dissection: Multifocal disease 5 cm, 1.7 cm, 1.6 cm, ER positive PR negative HER-2 positive Ki-67 44%, 3/7 lymph nodes positive    06/14/2012 - 06/12/2013 Chemotherapy   Adjuvant chemotherapy with [place] 6 followed by Herceptin maintenance    10/25/2012 - 12/11/2012 Radiation Therapy   Adjuvant radiation therapy    02/07/2013 -  Anti-estrogen oral therapy   Letrozole 2.5 mg daily    02/14/2015 Imaging   MRI spine: Large destructive T5 lesion with severe pathologic compression fracture and extensive epidural tumor, severe spinal stenosis with moderate cord compression, tumor involvement of T4    03/18/2015 PET scan   Residual enhancing soft tissue adjacent to the spinal cord. No evidence of metastatic disease. Nonspecific  uptake the left nipple    03/27/2015 - 03/30/2015 Hospital Admission   T4-6 decompression for spinal cord compression (lower extremity paralysis)    04/10/2015 -  Chemotherapy   Palliative treatment with Herceptin every 3 weeks with letrozole 2.5 mg daily    04/13/2015 - 05/19/2015 Radiation Therapy   Palliative radiation treatment to the spine    09/01/2015 Imaging   CT chest abdomen pelvis: Pathologic fracture with posterior fusion at T5 no other evidence of metastatic disease in the chest abdomen pelvis    12/23/2015 Imaging   CT CAP: new nonspecific 0.6 cm lymph node was stated mediastinum needs follow-[name] CT, innumerable tiny groundglass pulmonary nodules throughout both lungs unchanged, patchy consolidation from radiation, T5 fracture, no mets    04/15/2016 PET scan   Rt para-spinal mass mildy hypermetabolic SUV 5.9, lytic cortical lesion 4.8 cm lesion left prox femur suv 8.8 favor osseus met disease    04/28/2016 - 05/12/2016 Radiation Therapy   Palliative XRT Left femur    05/24/2016 Procedure   Soft tissue mass biopsy back: Metastatic breast cancer ER 80%, PR 0%, Ki-67 50%, HER-2 positive ratio 2.25    06/09/2016 - 03/29/2017 Anti-estrogen oral therapy   Faslodex with Herceptin and Perjeta every 4 weeks    06/13/2016 - 06/15/2016 Hospital Admission   uncomplicated revision of previous thoracic instrumentation.    10/10/2016 PET scan   Interval development of new hypermetabolic 2.5 x 1.1 cm lesion posterior right eighth rib consistent with metastatic disease, stable right paraspinal lesion at T8, clustered soft tissue nodules in the posterior midline back near cervicothoracic junction smaller than before  11/03/2016 - 11/07/2016 Radiation Therapy   Palliative radiation to right eighth rib    11/04/2016 Imaging   Solitary contrast-enhancing lesion in the right cerebellum 6 x 2 mm concerning for metastatic lesion    11/24/2016 - 11/24/2016 Radiation Therapy   SRS to the  brain lesion    04/18/2017 PET scan   New ill-defined rounded airspace opacity in posterior right lower lobe. Differential diagnosis includes malignancy and infectious/inflammatory process.Increased hypermetabolic lytic lesion in T8 vertebral body, consistent with bone metastasis.  Increased hypermetabolic posterior upper chest wall subcutaneous nodules, highly suspicious metastatic disease. Further improvement in right posterior eighth rib metastasis.    05/04/2017 - 07/27/2017 Chemotherapy   Kadcyla every 3 weeks palliative chemotherapy stopped for progression    07/26/2017 Imaging   Interval increase in size of the multiple enhancing masses posteriorly overlying the spine, interval development of left axillary and mediastinal adenopathy, increase in the size of lytic lesions in T8-T9 and left ninth rib, increased groundglass opacities right lower lobe lung    07/27/2017 - 05/11/2018 Chemotherapy   Palbociclib + Faslodex + trastuzumab + Pertuzumab     04/03/2018 PET scan   PET CT scan showed progression of disease not only in the bones but also in the subcutaneous nodules.    05/15/2018 - 05/25/2018 Radiation Therapy   Palliative radiation to the back    06/01/2018 - 12/27/2019 Chemotherapy   Enhertu (Transtuzumab Deruxtecan)     08/07/2018 - 08/07/2018 Radiation Therapy   T12 vertebral body SRS    01/2020 - 10/23/2020 Chemotherapy    Xeloda, Tucatinib, Herceptin    11/19/2020 -  Chemotherapy    Patient is on Treatment Plan: BREAST MARGETUXIMAB + VINORELBINE Q21D       Carcinoma of right breast, stage 4 (HCC)  02/14/2015 Initial Diagnosis   Breast cancer metastasized to bone, right ([place])    06/01/2018 - 02/07/2020 Chemotherapy   The patient had palonosetron (ALOXI) injection 0.25 mg, 0.25 mg, Intravenous,  Once, 24 of 30 cycles Administration: 0.25 mg (06/01/2018), 0.25 mg (06/22/2018), 0.25 mg (08/24/2018), 0.25 mg (07/13/2018), 0.25 mg (08/03/2018), 0.25 mg (09/14/2018), 0.25 mg  (10/05/2018), 0.25 mg (11/16/2018), 0.25 mg (10/26/2018), 0.25 mg (12/07/2018), 0.25 mg (12/28/2018), 0.25 mg (01/18/2019), 0.25 mg (03/15/2019), 0.25 mg (02/15/2019), 0.25 mg (04/12/2019), 0.25 mg (05/09/2019), 0.25 mg (06/07/2019), 0.25 mg (07/05/2019), 0.25 mg (08/09/2019), 0.25 mg (09/13/2019), 0.25 mg (10/18/2019), 0.25 mg (11/22/2019), 0.25 mg (12/27/2019) fam-trastuzumab deruxtecan-nxki (ENHERTU) 332 mg in dextrose 5 % 100 mL chemo infusion, 5.4 mg/kg = 332 mg, Intravenous,  Once, 24 of 30 cycles Dose modification: 4.4 mg/kg (original dose 5.4 mg/kg, Cycle 7, Reason: Provider Judgment), 3.2 mg/kg (original dose 5.4 mg/kg, Cycle 9, Reason: Dose not tolerated), 150 mg (original dose 5.4 mg/kg, Cycle 18, Reason: Dose not tolerated) Administration: 332 mg (06/01/2018), 332 mg (06/22/2018), 332 mg (07/13/2018), 332 mg (08/24/2018), 332 mg (08/03/2018), 332 mg (09/14/2018), 272 mg (10/05/2018), 200 mg (11/16/2018), 272 mg (10/26/2018), 200 mg (12/07/2018), 200 mg (12/28/2018), 200 mg (01/18/2019), 200 mg (03/15/2019), 200 mg (02/15/2019), 200 mg (04/12/2019), 200 mg (05/09/2019), 200 mg (06/07/2019), 150 mg (07/05/2019), 150 mg (08/09/2019), 150 mg (09/13/2019), 150 mg (10/18/2019), 150 mg (11/22/2019), 150 mg (12/27/2019)   for chemotherapy treatment.     05/29/2020 - 11/13/2020 Chemotherapy    Patient is on Treatment Plan: BREAST MARGETUXIMAB + VINORELBINE Q21D       Metastatic cancer to spine ([place])  Brain metastasis ([place])  11/18/2016 Initial Diagnosis   Brain metastasis ([place])  05/29/2020 - 11/13/2020 Chemotherapy    Patient is on Treatment Plan: BREAST MARGETUXIMAB + VINORELBINE Q21D         CURRENT THERAPY: Margetuximab/Navelbine  INTERVAL HISTORY: Heather [name] 75 y.o. female returns for evaluation prior to receiving cycle 1 day 8 of her Margetuximab and Navelbine.  She says that she tolerated the treatment quite well with minimal side effects.  She underwent restaging yesterday that noted progression of lytic metastases with  L1 and L3 compression fractures, sternal metastases, and pleural metastases and small left pleural effusion.  She is not having any shortness of breath, cough, or pleuritic chest pain.  She does note some anterior chest pain, and that is near the site of lymphadenopathy also noted on her scans.    She met with radiation oncology, underwent CT sim yesterday and will start treatment next week.  She is looking forward to spending the weekend with her granddaughters.  Heather [name] notes she is experiencing lower back pain.  She says that she has difficulty with remaining functional due to pain.  She wants more information on pain medications.     Patient Active Problem List   Diagnosis Date Noted   Bone metastasis ([place]) 02/02/2020   Port-A-Cath in place 07/13/2018   Goals of care, counseling/discussion 08/10/2017   Brain metastasis ([place]) 11/18/2016   Genetic testing 07/11/2016   ALLERGY TO CHLORHEXADINE, USE BETADINE ONLY 06/23/2016   Kyphosis of thoracic region 06/13/2016   Plantar fasciitis, bilateral 04/20/2016   Neuropathic pain 01/27/2016   Anemia of chronic disease 05/21/2015   Metastatic cancer to spine ([place]) 03/27/2015   Thrombocytopenia ([place]) 03/12/2015   Prerenal azotemia 03/12/2015   Pain    Recurrent UTI--due to Klebsiella    Paraplegia at T4 level ([place]) 02/20/2015   Neurogenic bowel 02/20/2015   Neurogenic bladder 02/20/2015   Postoperative anemia due to acute blood loss 02/17/2015   Spinal cord compression due to malignant neoplasm metastatic to spine ([place]) 02/15/2015   Epidural mass 02/15/2015   Thoracic spine tumor    Pathologic fracture of vertebra 02/14/2015   Pathologic fracture of thoracic vertebrae 02/14/2015   Carcinoma of right breast, stage 4 ([place]) 02/14/2015   Hyperlipidemia 02/14/2015   H. pylori infection    HTN (hypertension) 04/17/2012   Primary cancer of upper outer quadrant of right female breast ([place]) 03/08/2012    is allergic to other, acyclovir and  related, brompheniramine, chlorhexidine, dextromethorphan, monascus purpureus went yeast, phenylephrine hcl, pravastatin sodium, tegaderm ag mesh [silver], zanaflex [tizanidine hcl], codeine, keflex [cephalexin], naprosyn [naproxen], oxycodone, tramadol, and tussin [guaifenesin].  MEDICAL HISTORY: Past Medical History:  Diagnosis Date   Anemia    hx of   Anxiety    r/t updated surgery   Breast cancer ([place])    right   Cataract    immature-not sure of which eye   Complication of anesthesia    pt states she is sensitive to meds   Dizziness    has been going on for 22month;medical MD aware   Genetic testing 07/11/2016   Test Results: Normal; No pathogenic mutations detected  Genes Analyzed: 43 genes on [employer]'s Common Cancers panel (APC, ATM, AXIN2, BARD1, BMPR1A, BRCA1, BRCA2, BRIP1, CDH1, CDKN2A, CHEK2, DICER1, EPCAM, GREM1, HOXB13, KIT, MEN1, MLH1, MSH2, MSH6, MUTYH, NBN, NF1, PALB2, PDGFRA, PMS2, POLD1, POLE, PTEN, RAD50, RAD51C, RAD51D, SDHA, SDHB, SDHC, SDHD, SMAD4, SMARCA4, STK11, TP53, TSC1, TSC2, VHL).   GERD (gastroesophageal reflux disease)    Tums prn   H. pylori infection  H/O hiatal hernia    Hemorrhoids    History of UTI    Hx of radiation therapy 10/25/12- 12/11/12   right chest wall/regional lymph nodes 5040 cGy, 28 sessions, right chest wall boost 1000 cGy 1 session   Hx: UTI (urinary tract infection)    Hyperlipidemia    but not on meds;diet and exercise controlled   Hypertension    recently started Aldactone    Insomnia    takes Melatoniin daily   Metastasis to spinal column (HCC)    T5, Left  Femur cancer- radiation    Neuropathy    "from back surgery"   PONV (postoperative nausea and vomiting)    pt experienced hair loss, confusion and combative- 02/15/15   Right knee pain    Right shoulder pain    Skin cancer    squamous, Nodule to back - "same cancer as breast."   Urinary frequency    d/t taking Aldactone    SURGICAL HISTORY: Past Surgical History:   Procedure Laterality Date   COLONOSCOPY     cyst removed from left breast  [place] 4 N/A 02/15/2015   Procedure: DECOMPRESSION T5 AND T3-T7 STABALIZATION;  Surgeon: Consuella Lose, MD;  Location: [facility];  Service: Neurosurgery;  Laterality: N/A;   ESOPHAGOGASTRODUODENOSCOPY     LAMINECTOMY N/A 03/27/2015   Procedure: Thoracic Four-Thoracic Six Laminectomy for tumor;  Surgeon: Consuella Lose, MD;  Location: [place];  Service: Neurosurgery;  Laterality: N/A;  T4-T6 Laminectomy   left wrist surgery   2004   with plate   MASTECTOMY MODIFIED RADICAL  05/10/2012   Procedure: MASTECTOMY MODIFIED RADICAL;  Surgeon: Merrie Roof, MD;  Location: [facility];  Service: General;  Laterality: Right;  RIGHT MODIFIED RADICAL MASTECTOMY   MASTECTOMY, RADICAL Right    MINOR BREAST BIOPSY Left 05/16/2016   Procedure: EXCISION OF BACK NODULE;  Surgeon: Autumn Messing III, MD;  Location: [place];  Service: General;  Laterality: Left;   Nodule  back  05/17/2015   PORTACATH PLACEMENT  05/10/2012   Procedure: INSERTION PORT-A-CATH;  Surgeon: Merrie Roof, MD;  Location: [facility];  Service: General;  Laterality: Left;    SOCIAL HISTORY: Social History   Socioeconomic History   Marital status: Married    Spouse name: Not on file   Number of children: 2   Years of education: Not on file   Highest education level: Not on file  Occupational History   Not on file  Tobacco Use   Smoking status: Never   Smokeless tobacco: Never  Vaping Use   Vaping Use: Never used  Substance and Sexual Activity   Alcohol use: No   Drug use: No   Sexual activity: Yes    Birth control/protection: Post-menopausal  Other Topics Concern   Not on file  Social History Narrative   Not on file   Social Determinants of Health   Financial Resource Strain: Not on file  Food Insecurity: Not on file  Transportation Needs: Not on file  Physical Activity: Not on file   Stress: Not on file  Social Connections: Not on file  Intimate Partner Violence: Not on file    FAMILY HISTORY: Family History  Problem Relation Age of Onset   Leukemia Mother 80       deceased 51   Stroke Father    Diabetes Mellitus II Father    Prostate cancer Brother 53  currently 10   Stomach cancer Maternal Grandmother 81       deceased 27   Prostate cancer Brother 71       currently 29    Review of Systems  Constitutional:  Positive for fatigue. Negative for appetite change, chills, fever and unexpected weight change.  HENT:   Negative for hearing loss, lump/mass and trouble swallowing.   Eyes:  Negative for eye problems and icterus.  Respiratory:  Negative for chest tightness, cough and shortness of breath.   Cardiovascular:  Positive for chest pain. Negative for leg swelling and palpitations.  Gastrointestinal:  Negative for abdominal distention, abdominal pain, constipation, diarrhea, nausea and vomiting.  Endocrine: Negative for hot flashes.  Genitourinary:  Negative for difficulty urinating.   Musculoskeletal:  Positive for back pain. Negative for arthralgias.  Skin:  Negative for itching and rash.  Neurological:  Negative for dizziness, extremity weakness, headaches and numbness.  Hematological:  Positive for adenopathy. Does not bruise/bleed easily.  Psychiatric/Behavioral:  Negative for depression. The patient is not nervous/anxious.      PHYSICAL EXAMINATION  ECOG PERFORMANCE STATUS: 2 - Symptomatic, <50% confined to bed  Vitals:   11/27/20 1000  BP: (!) 137/55  Pulse: 82  Resp: 18  Temp: 98.1 F (36.7 C)  SpO2: 98%    Physical Exam Constitutional:      General: She is not in acute distress.    Appearance: Normal appearance. She is not toxic-appearing.  HENT:     Head: Normocephalic and atraumatic.  Eyes:     General: No scleral icterus. Cardiovascular:     Rate and Rhythm: Normal rate and regular rhythm.     Pulses: Normal pulses.      Heart sounds: Normal heart sounds.  Pulmonary:     Effort: Pulmonary effort is normal.     Breath sounds: Normal breath sounds.  Abdominal:     General: Abdomen is flat. Bowel sounds are normal. There is no distension.     Palpations: Abdomen is soft.     Tenderness: There is no abdominal tenderness.  Musculoskeletal:        General: No swelling.     Cervical back: Neck supple.  Lymphadenopathy:     Cervical: No cervical adenopathy.  Skin:    General: Skin is warm and dry.     Findings: No rash.  Neurological:     General: No focal deficit present.     Mental Status: She is alert.  Psychiatric:        Mood and Affect: Mood normal.        Behavior: Behavior normal.    LABORATORY DATA:  CBC    Component Value Date/Time   WBC 5.9 11/27/2020 0933   WBC 6.2 07/27/2017 0836   RBC 3.85 (L) 11/27/2020 0933   HGB 12.3 11/27/2020 0933   HGB 12.0 05/05/2017 1046   HCT 36.3 11/27/2020 0933   HCT 36.9 05/05/2017 1046   PLT 247 11/27/2020 0933   PLT 210 05/05/2017 1046   MCV 94.3 11/27/2020 0933   MCV 83.1 05/05/2017 1046   MCH 31.9 11/27/2020 0933   MCHC 33.9 11/27/2020 0933   RDW 14.4 11/27/2020 0933   RDW 14.8 (H) 05/05/2017 1046   LYMPHSABS 0.6 (L) 11/27/2020 0933   LYMPHSABS 0.6 (L) 05/05/2017 1046   MONOABS 0.3 11/27/2020 0933   MONOABS 0.3 05/05/2017 1046   EOSABS 0.0 11/27/2020 0933   EOSABS 0.1 05/05/2017 1046   BASOSABS 0.0 11/27/2020 0933   BASOSABS  0.1 05/05/2017 1046    CMP     Component Value Date/Time   NA 138 11/27/2020 0933   NA 137 05/05/2017 1046   K 3.5 11/27/2020 0933   K 3.9 05/05/2017 1046   CL 98 11/27/2020 0933   CL 101 10/25/2012 0946   CO2 30 11/27/2020 0933   CO2 26 05/05/2017 1046   GLUCOSE 124 (H) 11/27/2020 0933   GLUCOSE 88 05/05/2017 1046   GLUCOSE 90 10/25/2012 0946   BUN 15 11/27/2020 0933   BUN 14.1 05/05/2017 1046   CREATININE 0.76 11/27/2020 0933   CREATININE 0.8 05/05/2017 1046   CALCIUM 12.8 (H) 11/27/2020 0933    CALCIUM 10.3 05/05/2017 1046   PROT 7.4 11/27/2020 0933   PROT 7.2 05/05/2017 1046   ALBUMIN 3.9 11/27/2020 0933   ALBUMIN 3.7 05/05/2017 1046   AST 52 (H) 11/27/2020 0933   AST 26 05/05/2017 1046   ALT 32 11/27/2020 0933   ALT 17 05/05/2017 1046   ALKPHOS 185 (H) 11/27/2020 0933   ALKPHOS 74 05/05/2017 1046   BILITOT 0.7 11/27/2020 0933   BILITOT 0.43 05/05/2017 1046   GFRNONAA >60 11/27/2020 0933   GFRAA >60 02/07/2020 0913         ASSESSMENT and THERAPY PLAN:   Primary cancer of upper outer quadrant of right female breast ([place]) Right breast invasive ductal carcinoma ER positive PR negative HER-2 positive Ki-67 44% multifocal disease 3/7 lymph nodes positive T2 N1 M0 stage IIB status post adjuvant chemotherapy with TCH followed by Herceptin maintenance, adjuvant radiation therapy and Letrozole. MRI 02/14/2015: T5 large destructive lesion with pathologic compression fracture and extensive epidural tumor, involvement of T4, excision metastatic carcinoma ER 60%, PR 0%, HER-2 positive ratio 2.71 average copy #7.45 03/27/2015: T4-6 decompression surgery for spinal cord compression PET-CT 04/17/16 Rt para-spinal mass mildy hypermetabolic SUV 5.9, lytic cortical lesion 4.8 cm lesion left prox femur suv 8.8 favor osseus met disease Echo 04/12/2017 shows well preserved ejection fraction of 55-60%    Treatment summary: 1. Resumed anastrozole 05/21/2015, but it has caused significant hair loss so we switched her to exemestane 25 mg once daily 07/23/2015 2. Herceptin every 3 week started 04/10/2015 3. Bone metastases: Zometa every 3 months 4. Radiation therapy to the spine (Dr. [name]) 04/13/2015 to 05/19/2015 5. Radiation therapy to left femur 04/28/2016 to 05/12/2016 6. SRS to the brain lesion 11/24/2016 7.  Kadcyla started 05/05/2017 discontinued 07/27/2017 8.Palbociclib + Faslodex + trastuzumab + Pertuzumab started 08/10/2017, stopped 04/04/2018 9. XRT to T12 07/19/2018 10. Enhertu  started 06/01/18-12/27/2019 stopped for progression 11 SRS to brain January 2021 12.  Lapatinib with anastrozole stopped for progression 13.  Xeloda, Tucatinib, Herceptin 14. Margetuximab with Navelbine 11/19/2020 -------------------------------------------------------------------------------------------------------------------------- Current treatment: Margetuximab with Navelbine cycle 1 day 8  I reviewed with Heather [name] that her labs are stable.  She will proceed with Navelbine alone today.  She is having increased back pain in the areas where her compression fractures are located.  Dr. [name] is looking at if there is anything he can offer, and she will also see radiology to discuss a kyphoplasty to those areas.   We talked about her pain.  I reviewed goals of pain control which are that she maintain her ability to function normally.  She agrees.  She is very sensitive to medications and does not want a lot of side effects, or to be sedated with the medications.  She is allergic to tramadol, and tylenol isn't working well for her pain.  I  recommended that she take the Vicodin her PCP had prescribed to her for her back pain when needed and to keep a pain journal.  I reviewed that there are other options for pain control also, but we need to see how she does with the next step [name] in therapy.    Heather [name] has hypercalcemia, and will receive zometa today.  I reviewed this with pharmacy who verified it is safe to give with her kidney function.  She will also receive B12 today.    Nyoka Cowden will return in 2 weeks for labs, f/u and her next appointment.  She knows to call for any questions that may arise between now and her next appointment.  We are happy to see her sooner if needed.    All questions were answered. The patient knows to call the clinic with any problems, questions or concerns. We can certainly see the patient much sooner if necessary.  Total encounter time: 45 minutes in face to face visit  time, order entry, chart review, lab review, coordination of care, and documentation of the encounter.  Wilber Bihari, NP 11/27/20 1:16 PM Medical Oncology and Hematology [facility] [place], [place] [address] Tel. [phone]    Fax. [phone]  *Total Encounter Time as defined by the Centers for Medicare and Medicaid Services includes, in addition to the face-to-face time of a patient visit (documented in the note above) non-face-to-face time: obtaining and reviewing outside history, ordering and reviewing medications, tests or procedures, care coordination (communications with other health care professionals or caregivers) and documentation in the medical record.

## 2020-11-19 NOTE — Assessment & Plan Note (Signed)
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 MRI 02/14/2015: T5 large destructive lesion with pathologic compression fracture and extensive epidural tumor, involvement of T4, excision metastatic carcinoma ER 60%, PR 0%, HER-2 positive ratio 2.71 average copy #7.45 03/27/2015: T4-6 decompression surgery for spinal cord compression PET-CT 12/10/17Rt para-spinal mass mildy hypermetabolic SUV 5.9, lytic cortical lesion 4.8 cm lesion left prox femur suv 8.8 favor osseus met disease Echo 04/12/2017 shows well preserved ejection fraction of 55-60%  Treatment summary: 1. Resumed anastrozole 05/21/2015, but it has caused significant hair loss so we switched her to exemestane 25 mg once daily 07/23/2015 2. Herceptin every 3 week started 04/10/2015 3. Bone metastases: Zometa every 3 months 4. Radiation therapy to the spine (Dr. [name]) 04/13/2015 to 05/19/2015 5. Radiation therapy to left femur 04/28/2016 to 05/12/2016 6. SRS to the brain lesion 11/24/2016 7.Kadcyla started 05/05/2017 discontinued 07/27/2017 8.Palbociclib +Faslodex +trastuzumab+ Pertuzumabstarted 08/10/2017, stopped 04/04/2018 9.XRT to T123/04/2019 10. Enhertu started 06/01/18-12/27/2019 stopped for progression 11SRS to brain January 2021 12.Lapatinib with anastrozole stopped for progression 13.  Xeloda, Tucatinib, Herceptin -------------------------------------------------------------------------------------------------------------------------- Current treatment: Margetuximab with Navelbine  Patient will receive Navelbine days 1 and 8 every 3 weeks and margetuximab on day 1  Return to clinic in 1 week for toxicity check

## 2020-11-19 NOTE — Assessment & Plan Note (Addendum)
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 MRI 02/14/2015: T5 large destructive lesion with pathologic compression fracture and extensive epidural tumor, involvement of T4, excision metastatic carcinoma ER 60%, PR 0%, HER-2 positive ratio 2.71 average copy #7.45 03/27/2015: T4-6 decompression surgery for spinal cord compression PET-CT 12/10/17Rt para-spinal mass mildy hypermetabolic SUV 5.9, lytic cortical lesion 4.8 cm lesion left prox femur suv 8.8 favor osseus met disease Echo 04/12/2017 shows well preserved ejection fraction of 55-60%  Treatment summary: 1. Resumed anastrozole 05/21/2015, but it has caused significant hair loss so we switched her to exemestane 25 mg once daily 07/23/2015 2. Herceptin every 3 week started 04/10/2015 3. Bone metastases: Zometa every 3 months 4. Radiation therapy to the spine (Dr. [name]) 04/13/2015 to 05/19/2015 5. Radiation therapy to left femur 04/28/2016 to 05/12/2016 6. SRS to the brain lesion 11/24/2016 7.Kadcyla started 05/05/2017 discontinued 07/27/2017 8.Palbociclib +Faslodex +trastuzumab+ Pertuzumabstarted 08/10/2017, stopped 04/04/2018 9.XRT to T123/04/2019 10. Enhertu started 06/01/18-12/27/2019 stopped for progression 11SRS to brain January 2021 12.Lapatinib with anastrozole stopped for progression 13.  Xeloda, Tucatinib, Herceptin 14. Margetuximab with Navelbine 11/19/2020 -------------------------------------------------------------------------------------------------------------------------- Current treatment: Margetuximab with Navelbine cycle 1 day 8  I reviewed with Earleen that her labs are stable.  She will proceed with Navelbine alone today.  She is having increased back pain in the areas where her  compression fractures are located.  Dr. [name] is looking at if there is anything he can offer, and she will also see radiology to discuss a kyphoplasty to those areas.   We talked about her pain.  I reviewed goals of pain control which are that she maintain her ability to function normally.  She agrees.  She is very sensitive to medications and does not want a lot of side effects, or to be sedated with the medications.  She is allergic to tramadol, and tylenol isn't working well for her pain.  I recommended that she take the Vicodin her PCP had prescribed to her for her back pain when needed and to keep a pain journal.  I reviewed that there are other options for pain control also, but we need to see how she does with the next step up in therapy.    Earleen has hypercalcemia, and will receive zometa today.  I reviewed this with pharmacy who verified it is safe to give with her kidney function.  She will also receive B12 today.    Nyoka Cowden will return in 2 weeks for labs, f/u and her next appointment.  She knows to call for any questions that may arise between now and her next appointment.  We are happy to see her sooner if needed.

## 2020-11-19 NOTE — Progress Notes (Signed)
Per [name], ok to treat with elevated AST and elevated calcium. Patient informed to increase oral fluid intake and to stop taking Vitamin D per Dr. Geralyn Flash instruction. Patient verbalizes understanding.

## 2020-11-20 ENCOUNTER — Telehealth: Payer: Self-pay | Admitting: *Deleted

## 2020-11-20 ENCOUNTER — Ambulatory Visit
Admission: RE | Admit: 2020-11-20 | Discharge: 2020-11-20 | Disposition: A | Discharge status: Home / Self Care | Payer: Medicare Other | Source: Ambulatory Visit | Attending: Radiation Oncology | Admitting: Radiation Oncology

## 2020-11-20 ENCOUNTER — Other Ambulatory Visit: Payer: Medicare Other

## 2020-11-20 ENCOUNTER — Other Ambulatory Visit: Payer: Self-pay | Admitting: *Deleted

## 2020-11-20 DIAGNOSIS — G9529 Other cord compression: Secondary | ICD-10-CM

## 2020-11-20 DIAGNOSIS — C7951 Secondary malignant neoplasm of bone: Secondary | ICD-10-CM

## 2020-11-20 DIAGNOSIS — G9389 Other specified disorders of brain: Secondary | ICD-10-CM | POA: Diagnosis not present

## 2020-11-20 DIAGNOSIS — M8458XS Pathological fracture in neoplastic disease, other specified site, sequela: Secondary | ICD-10-CM

## 2020-11-20 DIAGNOSIS — C7931 Secondary malignant neoplasm of brain: Secondary | ICD-10-CM

## 2020-11-20 MED ORDER — GADOBENATE DIMEGLUMINE 529 MG/ML IV SOLN
11.0000 mL | Freq: Once | INTRAVENOUS | Status: AC | PRN
  Administered 2020-11-20: 11 mL via INTRAVENOUS

## 2020-11-20 NOTE — Progress Notes (Unsigned)
lker

## 2020-11-20 NOTE — Telephone Encounter (Signed)
Called pt to see how she did with her treatment yesterday & she reports nothing new & feels she did OK.  Discussed side effects & reasons to call.  She states she knows how to reach [place].

## 2020-11-20 NOTE — Telephone Encounter (Signed)
-----   Message from Sinda Du, RN sent at 11/19/2020  1:28 PM EDT ----- Regarding: [name] - new chemo regimen New chemo regimen (navelbine and margenza)

## 2020-11-23 ENCOUNTER — Ambulatory Visit
Admission: RE | Admit: 2020-11-23 | Discharge: 2020-11-23 | Disposition: A | Discharge status: Home / Self Care | Payer: Medicare Other | Source: Ambulatory Visit | Attending: Radiation Oncology | Admitting: Radiation Oncology

## 2020-11-23 ENCOUNTER — Encounter: Payer: Self-pay | Admitting: Radiation Oncology

## 2020-11-23 ENCOUNTER — Ambulatory Visit: Payer: Self-pay | Admitting: Radiation Oncology

## 2020-11-23 ENCOUNTER — Inpatient Hospital Stay: Payer: Medicare Other

## 2020-11-23 DIAGNOSIS — C7931 Secondary malignant neoplasm of brain: Secondary | ICD-10-CM

## 2020-11-23 DIAGNOSIS — Z853 Personal history of malignant neoplasm of breast: Secondary | ICD-10-CM | POA: Diagnosis not present

## 2020-11-23 DIAGNOSIS — C7951 Secondary malignant neoplasm of bone: Secondary | ICD-10-CM

## 2020-11-23 NOTE — Progress Notes (Signed)
Patient was seen via mychart to discuss recent MRI brain scan.  Patient states she is doing well.  She denies headaches, nausea, or vomiting.  She reports she is having generalized weakness, having difficulty lifting, most objects feel extremely heavy.  She reports pain in her back, right upper arm, and neck.  She started on Prednisone 10 mg about a month ago and has noticed feeling lightheaded.  She has started a new chemo and is due for her second round 11/27/2020.  She has upcoming scans on 11/25/2020.  Gloriajean Dell. Leonie Green, BSN

## 2020-11-23 NOTE — Progress Notes (Signed)
Radiation Oncology         [phone] ________________________________  Name: Heather [name] MRN: [id number]  Date: 11/23/2020  DOB: 1945/11/14  Follow-Up Visit Conducted via Video Visit due to current COVID-19 concerns for limiting patient exposure Note  CC: Heather Stains, MD  Heather Stains, MD  Diagnosis:   75 yo woman with an enlarging occiput metastasis from stage IV breast cancer    ICD-10-CM   1. Brain metastasis ([place])  C79.31     2. Metastatic cancer to spine ([place])  C79.51       Interval Since Last Radiation:  8 months 11/1-11/11:   SRS// The right temporal target was treated to 25 Gy in 5 fractions of [place]  01/27/20 - 02/07/20: The two metastatic lesions in the right femur were treated to 30 Gy in 10 fractions.  05/31/19: SRS brain/ The 33m central cerebellar target was treated to a prescription dose of 20 Gy in a single fraction.  08/07/18:  SRS Spine// The targeted metastases in the T12 and L2 vertebral bodies were treated to a prescription dose of 18 Gy in 1 fraction.   05/14/2018 - 05/25/2018:  Chest Wall, Right Upper Posterior subcutaneous lesions / treated to 30 Gy delivered in 10 fractions of 3 Gy  05/18/2017 - 05/31/2017:  The thoracic spine (T7-T9) was treated to 30 Gy in 10 fractions of 3 Gy.  6/27-7/18/18 SRS and palliative treatment: 1.  The rib 8th was treated to 30 Gy in 10 fractions 2.  Right cerebellar 6 mm target was treated using 3 Dynamic Conformal Arcs to a prescription dose of 20 Gy in one fraction  04/27/16-05/12/16:  Left femur/ 30 Gy in 10 fractions   04/13/2015-05/19/2015 Postop:  55 Gy in 25 fractions of 2.2 Gy to the thoracic spine   10/25/12-12/11/12: 50.4 Gy to the right chest wall and regional lymph nodes in 28 fractions with a 10 Gy boost to the right chest wall in 1 session.  Narrative:  In summary, Heather [name] a well known patient to our service with a history of metastatic breast cancer originally treated in 2014 to the chest wall and  regional lymph nodes. She developed recurrent disease within the thoracic spine and subsequently has undergone 3 surgeries, in addition to radiotherapy, with her most recent surgery being on 06/13/2016 where she had removal of thoracic hardware and replacement of pedicle screws with stabilization.  Of note previously placed screws between T3, T4 T6 and T7 were identified and dissection was carried out inferiorly to T8, T9-T10 and T11, T8 through T11 pedicle screw trajectories were placed bilaterally. She recovered well since treatment to her spine as she had been paralyzed in 2016, and with rigorous therapy has been able to make an impressive recovery. She continues on systemic therapy under the care of Dr. GLindi Adieand is also followed along in the brain and spine oncology conference. PET scan on 10/10/16 revealed a new hypermetabolic 2.5 x 1.1 cm lesion in the posterior right 8th rib. She also mentioned having new onset headaches, dizziness, and an episode of visual change,so an MRI brain was performed 11/04/16 demonstrating a solitary contrast-enhancing lesion within the right cerebellum, measuring 6 x 2 mm and lying just beneath the right tentorial leaflet and concerning for a metastatic lesion.  This solitary lesion was confirmed with SRS protocol MRI brain on 11/16/16 with no new lesions noted. This lesion was treated with SRS on 11/23/16 which she tolerated very well. She also completed palliative radiotherapy  to the right eighth rib from 11/02/16 - 11/23/16 with significant improvement in her pain. She remained on Herceptin, Perjeta, Faslodex and Zometa for systemic disease under the care and direction of [name].  She developed increased pain in the T8 region and also had PET positive findings in this region, as well as hypermetabolic right lower lobe mass concerning for progressive metastatic disease on follow up imaging 04/18/2017, despite systemic treatment. Her systemic therapy was switched to Kadcyla on  05/05/17 with discontinuation of Faslodex, Herceptin, and Perjeta.  She proceeded with palliative radiotherapy to 30 Gy in 10 fractions to the thoracic spine (T7-T9) from 05/18/2017 - 05/31/2017.  This treatment was tolerated very well.  A repeat brain MRI performed 06/15/17 showed no new or progressive contrast enhancing lesions and no residual abnormality at the site of the previous treated right cerebellar lesion but Chest CT on 07/26/17 showed clear disease progression with interval increase in size of the multiple enhancing masses posteriorly overlying the spine, interval development of left axillary and mediastinal adenopathy, increase in the size of lytic lesions in T8-T9 and left ninth rib, as well as increased groundglass opacities right lower lobe lung. Therefore, she was switched to Palbociclib + Faslodex + trastuzumab + Pertuzumab, started 08/10/17.   MRI T-spine on 09/21/17 showed the known T12 lesion had slightly progressed by a few millimeters from 03/15/17 and there was a new lesion at L2 which was not present on lumbar MRI from 07/13/16. Follow up systemic imaging on 12/13/17, showed a good response to treatment with improved LAN and decreased subcutaneous nodules in the upper thoracic region.  Follow up brain MRI imaging has continued to show interval increase in size the right parietal calvarium metastasis with stable appearance in size of a right frontal calvarial metastasis and no parenchymal or dural metastases. PET scan from 04/03/2018 unfortunately showed significant disease progression despite systemic therapy.  Her systemic chemotherapy was discontinued on 04/04/2018 due to disease progression and she was switched to [facility] (Fam-trastuzumab-Durextecan) q 3 weeks beginning 06/01/18.  Repeat MRI imaging of the brain and thoracic spine was performed on 07/12/2018.  Brain imaging demonstrated 3 skull metastases ranging from 5 to 14 mm in diameter which appeared stable to slightly increased since December  2019 but no new metastases or brain parenchymal or dural metastases identified.  On thoracic spine imaging there was enlargement of the left paracentral T12 vertebral body metastasis increased from 14 mm in May 2019 to 20 mm currently without associated pathologic fracture and otherwise stable appearance of previously treated lesions in the thoracic spine with no new disease noted.  The posterior subcutaneous nodular metastases appear regressed.  Her imaging studies were reviewed and discussed at the multidisciplinary brain and spine conference on 07/16/2018 and the consensus recommendation was to proceed with stereotactic radiosurgery [facility]) to the progressive disease at T12 which was completed on 08/07/18.  At the time of her CT [facility] for treatment planning, it was also noted that she had an enlarging lesion in the vertebral body of L2 as compared to her PET scan from 03/28/2018 so this lesion was included in the planning and was treated with a single fraction SRS at the time of T12 treatment as well. She tolerated the treatment well and did not experience any ill side effects.  Follow up systemic imaging with PET 10/03/18 showed a good response to treatment with less activity in the recently treated lesions at L2 and T12, no new bony lesions, as well as resolution of  subcutaneous nodules in the upper back and pubic area and resolution of right adrenal hypermetabolic mass.  Her brain MRI from 10/13/18 was also stable with no visible intraparenchymal metastases to the brain and an unchanged appearance of 3 calvarial metastases.  She has longstanding weakness in bilateral LEs, unchanged recently and she associates this with her inactivity due to pronounced fatigue following systemic treatments with ENHERTU.  She reports that it begins to improve approximately 2 weeks after her treatments when she is able to become more mobile but resumes following each treatment every 3 weeks.  She denies any new or increased paraesthesias  in the lower extremities but reports neuropathy in bilateral UEs as well as "nerve pain" in the right axilla that travels down her right side/torso.  She also reports chronic right>left jaw pain and reports that her dentist has told her previously that she had some loss of bone in the right jaw based on xray evaluation.  She is taking Zometa q 3 months and will discuss this with [name].   A follow up MRI brain scan from 05/17/2019 showed a slowly enlarging metastasis at the superior cerebellar vermis measuring 4 mm but without mass-effect or edema.  3 additional known calvarial metastases did not show any growth or progression.  After discussion of these findings, the patient elected to proceed with [place] treatment to the enlarging lesion and this was completed in a single fraction on 05/31/2019. She tolerated the treatment very well, without any ill side effects. Follow up MRI brain scan performed 09/01/19 showed stability of the previously treated lesions and calvarial lesions in the right frontal and parietal regions. There was a slight increase in size (from 1.2 cm to 1.6 cm) of a right temporal calvarial metastasis but no new metastases identified and no parenchymal disease.  She continued ENHERTU systemic therapy every 5 weeks under the care of [name] but this was discontinued in 12/2019 due to disease progression.  She was switched to Lapatinib and anastrazole systemic therapy. When we met for follow up in 01/2020, she mentioned that she had been having some progressive bilateral thigh pain, L>R. The pain would occasionally wake her from sleep at night and was worse with long periods of standing or weightbearing activities. She denied any recent trauma or injury and reported that she has had similar pain in the past and was found to have bony metastasis in the mid left femur at that time, and subsequently treated with palliative radiation in 2018.  The follow up MRI brain scan from 01/02/20 showed 2 new  enhancing skull metastases, measuring 9 mm at the superolateral aspect of the left orbit and 5 mm in the left temporal bone but no evidence of residual parenchymal disease and other skull lesions appeared relatively stable. This scan was reviewed at brain and spine tumor board and consensus recommendation was to continue to monitor the skull lesions on routine follow up scans.   Follow up systemic imaging with CT A/P and Bone scan on 01/01/20 showed stable osseous metastases and small prevascular nodes measuring up to 0.8 cm, suspicious for early nodal recurrence. She met with [name] on 01/02/20 and was switched to Lapatinib and anastrazole systemic therapy.  Xrays of the right and left femurs were performed on 01/15/20 for further evaluation of the progressive pain she was having and did show metastatic lesions in both the right and left femoral shaft, potentially at risk for fracture if left untreated.  Dr. [name] fused her most  recent imaging with our prior treatment planning images and did confirm overlap/retreatment of the left-sided lesion in previous treatment fields. Therefore, the patient proceeded with a 2 week course of palliative radiation to the two metastatic lesions in the right femur, completed 02/07/20 and tolerated well.  She was seen with ENT at [facility] in 02/2020 for evaluation of right sided jaw pain and tenderness with the bone under her right ear and across her right cheek. She said that the pain was intermittent, but she woke up with swelling and more pain than usual which prompted the evaluation with ENT. She was not able to get in with an ENT here, so she got an appointment with someone at [facility] on 02/14/20 Guadalupe Maple, PA-C) to assess the worsening symptoms she was experiencing, as they were affecting her ability to eat. We received a note from ENT questioning whether her symptoms might be associated with the progressive metastatic disease in the right anterior temporal bone with  questionable involvement of the right temporal muscle.  In light of these symptoms, Dr. [name] reviewed the MRI scan from 01/02/20 at tumor board and the recommendation was to proceed with a 5 fraction course of SRS to the right temporal skull metastasis with brain and masticator invasion.  This treatment was completed on March 19, 2020 and tolerated well.  Her follow-up MRI brain from 05/13/20 showed marked progression of osseous metastatic disease throughout the calvarium, skull base and upper cervical spine with progressive bony metastatic disease in the calvarium extending into the lateral orbital rim with encroachment upon the extra conal orbital soft tissues on the left, correlating with the palpable, enlarging eyebrow lesion that she had noted recently. There was no evidence of parenchymal brain metastatic disease or signs of leptomeningeal spread.  She also had repeat systemic imaging with CT C/A/P and bone scan 05/19/20 confirming disease progression with interval development of multiple new lytic bone lesions within the lumbar spine, bony pelvis and proximal femurs, compatible with progression of disease.  This resulted in discontinuation of the Lapatinib and anastrazole systemic therapy and she was switched to Xeloda and Tucatinib which she has been tolerating well.  Brain MRI on 7/15 was reviewed in our brain tumor conference this morning.  The new study shows a rapidly expanding occipital mass extending to the subcutaneous tissue of the scalp as well as intracranially to exert pressure indentation to the transverse sinus.  The patient denies any headache or nausea.  However, she does note some imbalance and also she has noted the rapidly enlarging occipital protuberance over the past 2 months.  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     Here is her onc synopsis: Oncology History  Primary cancer of upper outer quadrant of right female breast ([place])  03/08/2012 Initial Diagnosis   Right breast  invasive ductal carcinoma ER positive PR negative HER-2 positive Ki-67 44%; another breast mass biopsied in the anterior part of the breast which was also positive for malignancy that was HER-2 negative    03/14/2012 Cancer Staging   Staging form: Breast, AJCC 7th Edition - Pathologic: Stage IV (M1) - Signed by [name], NP on 06/22/2018    05/10/2012 Surgery   Right mastectomy and axillary lymph node dissection: Multifocal disease 5 cm, 1.7 cm, 1.6 cm, ER positive PR negative HER-2 positive Ki-67 44%, 3/7 lymph nodes positive    06/14/2012 - 06/12/2013 Chemotherapy   Adjuvant chemotherapy with [place] 6 followed by Herceptin maintenance    10/25/2012 - 12/11/2012 Radiation  Therapy   Adjuvant radiation therapy    02/07/2013 -  Anti-estrogen oral therapy   Letrozole 2.5 mg daily    02/14/2015 Imaging   MRI spine: Large destructive T5 lesion with severe pathologic compression fracture and extensive epidural tumor, severe spinal stenosis with moderate cord compression, tumor involvement of T4    03/18/2015 PET scan   Residual enhancing soft tissue adjacent to the spinal cord. No evidence of metastatic disease. Nonspecific uptake the left nipple    03/27/2015 - 03/30/2015 Hospital Admission   T4-6 decompression for spinal cord compression (lower extremity paralysis)    04/10/2015 -  Chemotherapy   Palliative treatment with Herceptin every 3 weeks with letrozole 2.5 mg daily    04/13/2015 - 05/19/2015 Radiation Therapy   Palliative radiation treatment to the spine    09/01/2015 Imaging   CT chest abdomen pelvis: Pathologic fracture with posterior fusion at T5 no other evidence of metastatic disease in the chest abdomen pelvis    12/23/2015 Imaging   CT CAP: new nonspecific 0.6 cm lymph node was stated mediastinum needs follow-up CT, innumerable tiny groundglass pulmonary nodules throughout both lungs unchanged, patchy consolidation from radiation, T5 fracture, no mets     04/15/2016 PET scan   Rt para-spinal mass mildy hypermetabolic SUV 5.9, lytic cortical lesion 4.8 cm lesion left prox femur suv 8.8 favor osseus met disease    04/28/2016 - 05/12/2016 Radiation Therapy   Palliative XRT Left femur    05/24/2016 Procedure   Soft tissue mass biopsy back: Metastatic breast cancer ER 80%, PR 0%, Ki-67 50%, HER-2 positive ratio 2.25    06/09/2016 - 03/29/2017 Anti-estrogen oral therapy   Faslodex with Herceptin and Perjeta every 4 weeks    06/13/2016 - 06/15/2016 Hospital Admission   uncomplicated revision of previous thoracic instrumentation.    10/10/2016 PET scan   Interval development of new hypermetabolic 2.5 x 1.1 cm lesion posterior right eighth rib consistent with metastatic disease, stable right paraspinal lesion at T8, clustered soft tissue nodules in the posterior midline back near cervicothoracic junction smaller than before     11/03/2016 - 11/07/2016 Radiation Therapy   Palliative radiation to right eighth rib    11/04/2016 Imaging   Solitary contrast-enhancing lesion in the right cerebellum 6 x 2 mm concerning for metastatic lesion    11/24/2016 - 11/24/2016 Radiation Therapy   SRS to the brain lesion    04/18/2017 PET scan   New ill-defined rounded airspace opacity in posterior right lower lobe. Differential diagnosis includes malignancy and infectious/inflammatory process.Increased hypermetabolic lytic lesion in T8 vertebral body, consistent with bone metastasis.  Increased hypermetabolic posterior upper chest wall subcutaneous nodules, highly suspicious metastatic disease. Further improvement in right posterior eighth rib metastasis.    05/04/2017 - 07/27/2017 Chemotherapy   Kadcyla every 3 weeks palliative chemotherapy stopped for progression    07/26/2017 Imaging   Interval increase in size of the multiple enhancing masses posteriorly overlying the spine, interval development of left axillary and mediastinal adenopathy, increase in the  size of lytic lesions in T8-T9 and left ninth rib, increased groundglass opacities right lower lobe lung    07/27/2017 - 05/11/2018 Chemotherapy   Palbociclib + Faslodex + trastuzumab + Pertuzumab     04/03/2018 PET scan   PET CT scan showed progression of disease not only in the bones but also in the subcutaneous nodules.    05/15/2018 - 05/25/2018 Radiation Therapy   Palliative radiation to the back    06/01/2018 - 12/27/2019 Chemotherapy  Enhertu (Transtuzumab Deruxtecan)     08/07/2018 - 08/07/2018 Radiation Therapy   T12 vertebral body SRS    01/2020 - 10/23/2020 Chemotherapy    Xeloda, Tucatinib, Herceptin    11/19/2020 -  Chemotherapy    Patient is on Treatment Plan: BREAST MARGETUXIMAB + VINORELBINE Q21D       Carcinoma of right breast, stage 4 (HCC)  02/14/2015 Initial Diagnosis   Breast cancer metastasized to bone, right ([place])    06/01/2018 - 02/07/2020 Chemotherapy   The patient had palonosetron (ALOXI) injection 0.25 mg, 0.25 mg, Intravenous,  Once, 24 of 30 cycles Administration: 0.25 mg (06/01/2018), 0.25 mg (06/22/2018), 0.25 mg (08/24/2018), 0.25 mg (07/13/2018), 0.25 mg (08/03/2018), 0.25 mg (09/14/2018), 0.25 mg (10/05/2018), 0.25 mg (11/16/2018), 0.25 mg (10/26/2018), 0.25 mg (12/07/2018), 0.25 mg (12/28/2018), 0.25 mg (01/18/2019), 0.25 mg (03/15/2019), 0.25 mg (02/15/2019), 0.25 mg (04/12/2019), 0.25 mg (05/09/2019), 0.25 mg (06/07/2019), 0.25 mg (07/05/2019), 0.25 mg (08/09/2019), 0.25 mg (09/13/2019), 0.25 mg (10/18/2019), 0.25 mg (11/22/2019), 0.25 mg (12/27/2019) fam-trastuzumab deruxtecan-nxki (ENHERTU) 332 mg in dextrose 5 % 100 mL chemo infusion, 5.4 mg/kg = 332 mg, Intravenous,  Once, 24 of 30 cycles Dose modification: 4.4 mg/kg (original dose 5.4 mg/kg, Cycle 7, Reason: Provider Judgment), 3.2 mg/kg (original dose 5.4 mg/kg, Cycle 9, Reason: Dose not tolerated), 150 mg (original dose 5.4 mg/kg, Cycle 18, Reason: Dose not tolerated) Administration: 332 mg (06/01/2018), 332 mg  (06/22/2018), 332 mg (07/13/2018), 332 mg (08/24/2018), 332 mg (08/03/2018), 332 mg (09/14/2018), 272 mg (10/05/2018), 200 mg (11/16/2018), 272 mg (10/26/2018), 200 mg (12/07/2018), 200 mg (12/28/2018), 200 mg (01/18/2019), 200 mg (03/15/2019), 200 mg (02/15/2019), 200 mg (04/12/2019), 200 mg (05/09/2019), 200 mg (06/07/2019), 150 mg (07/05/2019), 150 mg (08/09/2019), 150 mg (09/13/2019), 150 mg (10/18/2019), 150 mg (11/22/2019), 150 mg (12/27/2019)   for chemotherapy treatment.     05/29/2020 - 11/13/2020 Chemotherapy    Patient is on Treatment Plan: BREAST MARGETUXIMAB + VINORELBINE Q21D       Metastatic cancer to spine ([place])  Brain metastasis ([place])  11/18/2016 Initial Diagnosis   Brain metastasis ([place])    05/29/2020 - 11/13/2020 Chemotherapy    Patient is on Treatment Plan: BREAST MARGETUXIMAB + VINORELBINE Q21D                        ALLERGIES:  is allergic to other, acyclovir and related, brompheniramine, chlorhexidine, dextromethorphan, monascus purpureus went yeast, phenylephrine hcl, pravastatin sodium, tegaderm ag mesh [silver], zanaflex [tizanidine hcl], codeine, keflex [cephalexin], naprosyn [naproxen], oxycodone, tramadol, and tussin [guaifenesin].  Meds: Current Outpatient Medications  Medication Sig Dispense Refill   acetaminophen (TYLENOL) 500 MG tablet Take 500 mg by mouth every 6 (six) hours as needed.     ALPRAZolam (XANAX) 0.25 MG tablet Take 1 tablet by mouth as needed.     BOOSTRIX 5-2.5-18.5 LF-MCG/0.5 injection      COLLAGEN PO Take by mouth.     Cranberry 250 MG CAPS Take by mouth.     dexamethasone (DECADRON) 4 MG tablet Take 1 tablet (4 mg total) by mouth daily. Take for 2 days after margetuximab chemo. Take with breakfast. 30 tablet 1   famotidine (PEPCID) 40 MG tablet Take 40 mg by mouth daily.     gabapentin (NEURONTIN) 600 MG tablet Take 600 mg by mouth 2 (two) times daily.     HYDROcodone-acetaminophen (NORCO/VICODIN) 5-325 MG tablet Take 1 tablet by mouth every 6 (six) hours  as needed for moderate pain. 30 tablet 0  lidocaine-prilocaine (EMLA) cream Apply to affected area once 30 g 3   ondansetron (ZOFRAN) 8 MG tablet Take 1 tablet (8 mg total) by mouth 2 (two) times daily as needed. Start on the third day after margetuximab chemo. 30 tablet 1   OVER THE COUNTER MEDICATION Place 1 drop into both eyes 2 (two) times daily as needed (dry eyes). Over the counter lubricating eye drops     polyethylene glycol (MIRALAX / GLYCOLAX) packet Take 17 g by mouth daily as needed.     predniSONE (DELTASONE) 10 MG tablet Take 1 tablet (10 mg total) by mouth daily with breakfast. 30 tablet 1   prochlorperazine (COMPAZINE) 10 MG tablet Take 1 tablet (10 mg total) by mouth every 6 (six) hours as needed (Nausea or vomiting). 30 tablet 1   spironolactone (ALDACTONE) 25 MG tablet Take 25 mg by mouth daily.     No current facility-administered medications for this encounter.   Facility-Administered Medications Ordered in Other Encounters  Medication Dose Route Frequency Provider Last Rate Last Admin   cyanocobalamin ((VITAMIN B-12)) injection 1,000 mcg  1,000 mcg Intramuscular Q6 weeks [name], MD   1,000 mcg at 03/19/20 1110   heparin lock flush 100 unit/mL  500 Units Intracatheter Once [name], MD       sodium chloride flush (NS) 0.9 % injection 10 mL  10 mL Intracatheter Once [name], MD        Physical Findings: The patient is in no acute distress. Patient is alert and oriented. .  No significant changes.  Lab Findings: Lab Results  Component Value Date   WBC 6.0 11/19/2020   WBC 6.2 07/27/2017   HGB 12.2 11/19/2020   HGB 12.0 05/05/2017   HCT 36.2 11/19/2020   HCT 36.9 05/05/2017   PLT 197 11/19/2020   PLT 210 05/05/2017    Lab Results  Component Value Date   NA 137 11/19/2020   NA 137 05/05/2017   K 4.0 11/19/2020   K 3.9 05/05/2017   CHLORIDE 102 05/05/2017   CO2 26 11/19/2020   CO2 26 05/05/2017   GLUCOSE 119 (H) 11/19/2020   GLUCOSE 88  05/05/2017   GLUCOSE 90 10/25/2012   BUN 20 11/19/2020   BUN 14.1 05/05/2017   CREATININE 0.83 11/19/2020   CREATININE 0.8 05/05/2017   BILITOT 0.7 11/19/2020   BILITOT 0.43 05/05/2017   ALKPHOS 236 (H) 11/19/2020   ALKPHOS 74 05/05/2017   AST 97 (H) 11/19/2020   AST 26 05/05/2017   ALT 34 11/19/2020   ALT 17 05/05/2017   PROT 7.7 11/19/2020   PROT 7.2 05/05/2017   ALBUMIN 3.4 (L) 11/19/2020   ALBUMIN 3.7 05/05/2017   CALCIUM 12.4 (H) 11/19/2020   CALCIUM 10.3 05/05/2017   ANIONGAP 10 11/19/2020    Radiographic Findings: MR Brain W Wo Contrast  Result Date: 11/22/2020 CLINICAL DATA:  Metastatic breast cancer, follow-up EXAM: MRI HEAD WITHOUT AND WITH CONTRAST TECHNIQUE: Multiplanar, multiecho pulse sequences of the brain and surrounding structures were obtained without and with intravenous contrast. CONTRAST:  34m MULTIHANCE GADOBENATE DIMEGLUMINE 529 MG/ML IV SOLN COMPARISON:  08/21/2020 FINDINGS: Brain: There is no acute infarction or intracranial hemorrhage. There is no intracranial mass, mass effect, or edema. There is no hydrocephalus or extra-axial fluid collection. Ventricles and sulci are within normal limits in size and configuration. Patchy foci of T2 hyperintensity in the supratentorial and pontine white matter are nonspecific but may reflect mild chronic microvascular ischemic changes. No abnormal parenchymal enhancement. Vascular:  Major vessel flow voids at the skull base are preserved. See below regarding dural venous sinuses. Skull and upper cervical spine: Extensive osseous metastatic disease is again noted with lesions throughout the calvarium, skull base, and included cervical spine. This has progressed since the prior study with increase in size of lesions and greater extent of involvement. Extraosseous extension is present in the occipital and suboccipital regions with scalp involvement and epidural disease. Adjacent dural venous sinus involvement is present including a  portion of the torcula and right transverse sinus, which remain patent. Sinuses/Orbits: Paranasal sinuses are aerated. Orbits are unremarkable. Other: Sella is unremarkable.  Mastoid air cells are clear. IMPRESSION: Progression of osseous metastatic disease since 08/21/2020. There is extraosseous extension in the occipital and suboccipital regions; epidural disease involves a portion of the torcula and right transverse sinus. No evidence of parenchymal or leptomeningeal metastatic disease. These results will be called to the ordering clinician or representative by the Radiologist Assistant, and communication documented in the PACS or [employer]. Electronically Signed   By: Macy Mis M.D.   On: 11/22/2020 07:25    Impression:  The patient has a history of multiple locally treated metastatic deposits from stage IV breast cancer.  At present, she does appear to have some progression of calvarial metastases diffusely with 1 lesion showing central necrosis and more rapid expansion in the occipital region.  Plan: Today, I reviewed the patient's MRI with her and her daughter-in-law by sharing my screen on the face-to-face video visit.  We discussed potential palliative local radiation to the occipital metastasis.  Patient would like to consider radiation treatment to this area.  She would also like the MRI imaging pushed to her [facility] oncology team for their input as well.  I will work with our [place], [place] to move forward as above.  This visit was conducted via telephone to spare the patient unnecessary potential exposure in the healthcare setting during the current COVID-19 pandemic.   Given current concerns for patient exposure during the COVID-19 pandemic, this encounter was conducted via video visit face-to-face. The patient has given verbal consent for this type of encounter.  The attendants for this meeting include Tyler Pita MD, patient, Nzinga Ferran and family members her  daughter-in-law. During the encounter, Tyler Pita MD, was located at [facility] Radiation Oncology Department.  Patient, and her daughter-in-law were located at home.  The time spent during this encounter was 45 minutes including film review, chart review, documentation, correspondence, and orders.    _____________________________________  Sheral Apley [name], M.D.

## 2020-11-24 NOTE — Progress Notes (Signed)
  Radiation Oncology         [phone] ________________________________  Name: Heather [name] MRN: [id number]  Date: 11/26/2020  DOB: 1946-04-11  SIMULATION AND TREATMENT PLANNING NOTE    ICD-10-CM   1. Brain metastasis ([place])  C79.31       DIAGNOSIS:  75 yo woman with an enlarging occiput and left orbit metastasis from stage IV breast cancer  NARRATIVE:  The patient was brought to the [facility].  Identity was confirmed.  All relevant records and images related to the planned course of therapy were reviewed.  The patient freely provided informed written consent to proceed with treatment after reviewing the details related to the planned course of therapy. The consent form was witnessed and verified by the simulation staff. Intravenous access was established for contrast administration. Then, the patient was set-up in a stable reproducible supine position for radiation therapy.  A relocatable thermoplastic stereotactic head frame was fabricated for precise immobilization.  CT images were obtained.  Surface markings were placed.  The CT images were loaded into the planning software and fused with the patient's targeting MRI scan.  Then the target and avoidance structures were contoured.  Treatment planning then occurred.  The radiation prescription was entered and confirmed.  I have requested 3D planning  I have requested a DVH of the following structures: Brain stem, brain, left eye, right eye, lenses, optic chiasm, target volumes, uninvolved brain, and normal tissue.    SPECIAL TREATMENT PROCEDURE:  The planned course of therapy using radiation constitutes a special treatment procedure. Special care is required in the management of this patient for the following reasons. This treatment constitutes a Special Treatment Procedure for the following reason: High dose per fraction requiring special monitoring for increased toxicities of treatment including daily imaging.  The special  nature of the planned course of radiotherapy will require increased physician supervision and oversight to ensure patient's safety with optimal treatment outcomes.  PLAN:  The patient will receive 25 Gy in 5 fractions to the occiput and left orbit.  ________________________________  Sheral Apley. [name], M.D.

## 2020-11-25 ENCOUNTER — Ambulatory Visit (HOSPITAL_COMMUNITY)
Admission: RE | Admit: 2020-11-25 | Discharge: 2020-11-25 | Disposition: A | Discharge status: Home / Self Care | Payer: Medicare Other | Source: Ambulatory Visit | Attending: Adult Health | Admitting: Adult Health

## 2020-11-25 ENCOUNTER — Other Ambulatory Visit: Payer: Self-pay

## 2020-11-25 ENCOUNTER — Ambulatory Visit: Payer: Medicare Other | Admitting: Urology

## 2020-11-25 ENCOUNTER — Encounter (HOSPITAL_COMMUNITY)
Admission: RE | Admit: 2020-11-25 | Discharge: 2020-11-25 | Disposition: A | Discharge status: Home / Self Care | Payer: Medicare Other | Source: Ambulatory Visit | Attending: Adult Health | Admitting: Adult Health

## 2020-11-25 DIAGNOSIS — C7951 Secondary malignant neoplasm of bone: Secondary | ICD-10-CM | POA: Insufficient documentation

## 2020-11-25 DIAGNOSIS — C7931 Secondary malignant neoplasm of brain: Secondary | ICD-10-CM

## 2020-11-25 DIAGNOSIS — C50411 Malignant neoplasm of upper-outer quadrant of right female breast: Secondary | ICD-10-CM

## 2020-11-25 DIAGNOSIS — C50911 Malignant neoplasm of unspecified site of right female breast: Secondary | ICD-10-CM | POA: Insufficient documentation

## 2020-11-25 DIAGNOSIS — C50919 Malignant neoplasm of unspecified site of unspecified female breast: Secondary | ICD-10-CM | POA: Diagnosis not present

## 2020-11-25 MED ORDER — IOHEXOL 9 MG/ML PO SOLN
ORAL | Status: AC
  Filled 2020-11-25: qty 1000

## 2020-11-25 MED ORDER — IOHEXOL 350 MG/ML SOLN
75.0000 mL | Freq: Once | INTRAVENOUS | Status: AC | PRN
  Administered 2020-11-25: 75 mL via INTRAVENOUS

## 2020-11-25 MED ORDER — HEPARIN SOD (PORK) LOCK FLUSH 100 UNIT/ML IV SOLN
500.0000 [IU] | Freq: Once | INTRAVENOUS | Status: AC
  Administered 2020-11-25: 500 [IU] via INTRAVENOUS

## 2020-11-25 MED ORDER — IOHEXOL 9 MG/ML PO SOLN
1000.0000 mL | ORAL | Status: AC
  Administered 2020-11-25: 1000 mL via ORAL

## 2020-11-25 MED ORDER — TECHNETIUM TC 99M MEDRONATE IV KIT
20.0000 | PACK | Freq: Once | INTRAVENOUS | Status: AC | PRN
  Administered 2020-11-25: 20.9 via INTRAVENOUS

## 2020-11-25 MED ORDER — HEPARIN SOD (PORK) LOCK FLUSH 100 UNIT/ML IV SOLN
INTRAVENOUS | Status: AC
  Filled 2020-11-25: qty 5

## 2020-11-25 NOTE — Progress Notes (Signed)
RN successfully faxed Rx for roller walker to [facility] at [phone].  Patient aware, no further needs.

## 2020-11-26 ENCOUNTER — Ambulatory Visit
Admission: RE | Admit: 2020-11-26 | Discharge: 2020-11-26 | Disposition: A | Discharge status: Home / Self Care | Payer: Medicare Other | Source: Ambulatory Visit | Attending: Radiation Oncology | Admitting: Radiation Oncology

## 2020-11-26 VITALS — BP 129/63 | HR 84 | Temp 97.7°F | Resp 18 | Ht 62.0 in | Wt 112.6 lb

## 2020-11-26 DIAGNOSIS — C7931 Secondary malignant neoplasm of brain: Secondary | ICD-10-CM

## 2020-11-26 DIAGNOSIS — Z510 Encounter for antineoplastic radiation therapy: Secondary | ICD-10-CM | POA: Insufficient documentation

## 2020-11-26 DIAGNOSIS — Z853 Personal history of malignant neoplasm of breast: Secondary | ICD-10-CM | POA: Diagnosis not present

## 2020-11-26 DIAGNOSIS — Z95828 Presence of other vascular implants and grafts: Secondary | ICD-10-CM

## 2020-11-26 MED ORDER — SODIUM CHLORIDE 0.9% FLUSH
10.0000 mL | Freq: Once | INTRAVENOUS | Status: AC
  Administered 2020-11-26: 10 mL via INTRAVENOUS

## 2020-11-26 MED ORDER — HEPARIN SOD (PORK) LOCK FLUSH 100 UNIT/ML IV SOLN
500.0000 [IU] | Freq: Once | INTRAVENOUS | Status: AC
  Administered 2020-11-26: 500 [IU] via INTRAVENOUS

## 2020-11-26 NOTE — Progress Notes (Signed)
Has armband been applied?  Yes.    Does patient have an allergy to IV contrast dye?: No.   Has patient ever received premedication for IV contrast dye?: No.   Does patient take metformin?: No.  Date of lab work: November 19, 2020 BUN: 20 CR: 0.83 eGFR: >60  IV site: subclavian right, condition patent and no redness  Has IV site been added to flowsheet?  Yes.    BP 129/63 (BP Location: Left Arm, Patient Position: Sitting, Cuff Size: Normal)   Pulse 84   Temp 97.7 F (36.5 C)   Resp 18   Ht _0  (1.575 m)   Wt 112 lb 9.6 oz (51.1 kg)   SpO2 98%   BMI 20.59 kg/m

## 2020-11-27 ENCOUNTER — Inpatient Hospital Stay (HOSPITAL_BASED_OUTPATIENT_CLINIC_OR_DEPARTMENT_OTHER): Payer: Medicare Other | Admitting: Adult Health

## 2020-11-27 ENCOUNTER — Inpatient Hospital Stay: Payer: Medicare Other

## 2020-11-27 ENCOUNTER — Encounter: Payer: Self-pay | Admitting: Adult Health

## 2020-11-27 ENCOUNTER — Other Ambulatory Visit: Payer: Self-pay | Admitting: Adult Health

## 2020-11-27 ENCOUNTER — Other Ambulatory Visit: Payer: Self-pay

## 2020-11-27 VITALS — BP 137/55 | HR 82 | Temp 98.1°F | Resp 18 | Ht 62.0 in | Wt 112.1 lb

## 2020-11-27 DIAGNOSIS — C7951 Secondary malignant neoplasm of bone: Secondary | ICD-10-CM

## 2020-11-27 DIAGNOSIS — C50411 Malignant neoplasm of upper-outer quadrant of right female breast: Secondary | ICD-10-CM

## 2020-11-27 DIAGNOSIS — S32000A Wedge compression fracture of unspecified lumbar vertebra, initial encounter for closed fracture: Secondary | ICD-10-CM

## 2020-11-27 DIAGNOSIS — Z9221 Personal history of antineoplastic chemotherapy: Secondary | ICD-10-CM | POA: Diagnosis not present

## 2020-11-27 DIAGNOSIS — C7931 Secondary malignant neoplasm of brain: Secondary | ICD-10-CM

## 2020-11-27 DIAGNOSIS — Z170 Estrogen receptor positive status [ER+]: Secondary | ICD-10-CM | POA: Diagnosis not present

## 2020-11-27 DIAGNOSIS — Z5112 Encounter for antineoplastic immunotherapy: Secondary | ICD-10-CM | POA: Diagnosis not present

## 2020-11-27 LAB — CBC WITH DIFFERENTIAL (CANCER CENTER ONLY)
Abs Immature Granulocytes: 0.04 10*3/uL (ref 0.00–0.07)
Basophils Absolute: 0 10*3/uL (ref 0.0–0.1)
Basophils Relative: 0 %
Eosinophils Absolute: 0 10*3/uL (ref 0.0–0.5)
Eosinophils Relative: 0 %
HCT: 36.3 % (ref 36.0–46.0)
Hemoglobin: 12.3 g/dL (ref 12.0–15.0)
Immature Granulocytes: 1 %
Lymphocytes Relative: 10 %
Lymphs Abs: 0.6 10*3/uL — ABNORMAL LOW (ref 0.7–4.0)
MCH: 31.9 pg (ref 26.0–34.0)
MCHC: 33.9 g/dL (ref 30.0–36.0)
MCV: 94.3 fL (ref 80.0–100.0)
Monocytes Absolute: 0.3 10*3/uL (ref 0.1–1.0)
Monocytes Relative: 5 %
Neutro Abs: 5 10*3/uL (ref 1.7–7.7)
Neutrophils Relative %: 84 %
Platelet Count: 247 10*3/uL (ref 150–400)
RBC: 3.85 MIL/uL — ABNORMAL LOW (ref 3.87–5.11)
RDW: 14.4 % (ref 11.5–15.5)
WBC Count: 5.9 10*3/uL (ref 4.0–10.5)
nRBC: 0 % (ref 0.0–0.2)

## 2020-11-27 LAB — CMP (CANCER CENTER ONLY)
ALT: 32 U/L (ref 0–44)
AST: 52 U/L — ABNORMAL HIGH (ref 15–41)
Albumin: 3.9 g/dL (ref 3.5–5.0)
Alkaline Phosphatase: 185 U/L — ABNORMAL HIGH (ref 38–126)
Anion gap: 10 (ref 5–15)
BUN: 15 mg/dL (ref 8–23)
CO2: 30 mmol/L (ref 22–32)
Calcium: 12.8 mg/dL — ABNORMAL HIGH (ref 8.9–10.3)
Chloride: 98 mmol/L (ref 98–111)
Creatinine: 0.76 mg/dL (ref 0.44–1.00)
GFR, Estimated: >60 mL/min (ref >60)
Glucose, Bld: 124 mg/dL — ABNORMAL HIGH (ref 70–99)
Potassium: 3.5 mmol/L (ref 3.5–5.1)
Sodium: 138 mmol/L (ref 135–145)
Total Bilirubin: 0.7 mg/dL (ref 0.3–1.2)
Total Protein: 7.4 g/dL (ref 6.5–8.1)

## 2020-11-27 MED ORDER — CYANOCOBALAMIN 1000 MCG/ML IJ SOLN
1000.0000 ug | INTRAMUSCULAR | Status: DC
  Administered 2020-11-27: 1000 ug via INTRAMUSCULAR

## 2020-11-27 MED ORDER — VINORELBINE TARTRATE CHEMO INJECTION 50 MG/5ML
20.0000 mg/m2 | Freq: Once | INTRAVENOUS | Status: AC
  Administered 2020-11-27: 30 mg via INTRAVENOUS
  Filled 2020-11-27: qty 3

## 2020-11-27 MED ORDER — ZOLEDRONIC ACID 4 MG/100ML IV SOLN
4.0000 mg | Freq: Once | INTRAVENOUS | Status: AC
  Administered 2020-11-27: 4 mg via INTRAVENOUS

## 2020-11-27 MED ORDER — CYANOCOBALAMIN 1000 MCG/ML IJ SOLN
INTRAMUSCULAR | Status: AC
  Filled 2020-11-27: qty 1

## 2020-11-27 MED ORDER — ZOLEDRONIC ACID 4 MG/100ML IV SOLN
INTRAVENOUS | Status: AC
  Filled 2020-11-27: qty 100

## 2020-11-27 MED ORDER — PROCHLORPERAZINE MALEATE 10 MG PO TABS
ORAL_TABLET | ORAL | Status: AC
  Filled 2020-11-27: qty 1

## 2020-11-27 MED ORDER — HEPARIN SOD (PORK) LOCK FLUSH 100 UNIT/ML IV SOLN
500.0000 [IU] | Freq: Once | INTRAVENOUS | Status: AC | PRN
  Administered 2020-11-27: 500 [IU]
  Filled 2020-11-27: qty 5

## 2020-11-27 MED ORDER — PROCHLORPERAZINE MALEATE 10 MG PO TABS
10.0000 mg | ORAL_TABLET | Freq: Once | ORAL | Status: AC
  Administered 2020-11-27: 10 mg via ORAL

## 2020-11-27 MED ORDER — SODIUM CHLORIDE 0.9 % IV SOLN
Freq: Once | INTRAVENOUS | Status: AC
  Filled 2020-11-27: qty 250

## 2020-11-27 MED ORDER — ZOLEDRONIC ACID 4 MG/5ML IV CONC: 3.3000 mg | Freq: Once | INTRAVENOUS | Status: DC

## 2020-11-27 MED ORDER — SODIUM CHLORIDE 0.9% FLUSH
10.0000 mL | INTRAVENOUS | Status: DC | PRN
  Administered 2020-11-27: 10 mL
  Filled 2020-11-27: qty 10

## 2020-11-27 MED ORDER — SODIUM CHLORIDE 0.9% FLUSH
10.0000 mL | Freq: Once | INTRAVENOUS | Status: AC
  Administered 2020-11-27: 10 mL
  Filled 2020-11-27: qty 10

## 2020-11-27 MED ORDER — SODIUM CHLORIDE 0.9 % IV SOLN
Freq: Once | INTRAVENOUS | Status: DC
  Filled 2020-11-27: qty 250

## 2020-11-27 NOTE — Progress Notes (Signed)
Per Mendel Ryder the patient is to have Zometa and B12 today.

## 2020-11-27 NOTE — Patient Instructions (Signed)
[place] ONCOLOGY  Discharge Instructions: Thank you for choosing [facility] to provide your oncology and hematology care.   If you have a lab appointment with the [facility], please go directly to the [facility] and check in at the registration area.   Wear comfortable clothing and clothing appropriate for easy access to any Portacath or PICC line.   We strive to give you quality time with your provider. You may need to reschedule your appointment if you arrive late (15 or more minutes).  Arriving late affects you and other patients whose appointments are after yours.  Also, if you miss three or more appointments without notifying the office, you may be dismissed from the clinic at the provider's discretion.      For prescription refill requests, have your pharmacy contact our office and allow 72 hours for refills to be completed.    Today you received the following chemotherapy and/or immunotherapy agents: Vinorelbine    To help prevent nausea and vomiting after your treatment, we encourage you to take your nausea medication as directed.  BELOW ARE SYMPTOMS THAT SHOULD BE REPORTED IMMEDIATELY: *FEVER GREATER THAN 100.4 F (38 C) OR HIGHER *CHILLS OR SWEATING *NAUSEA AND VOMITING THAT IS NOT CONTROLLED WITH YOUR NAUSEA MEDICATION *UNUSUAL SHORTNESS OF BREATH *UNUSUAL BRUISING OR BLEEDING *URINARY PROBLEMS (pain or burning when urinating, or frequent urination) *BOWEL PROBLEMS (unusual diarrhea, constipation, pain near the anus) TENDERNESS IN MOUTH AND THROAT WITH OR WITHOUT PRESENCE OF ULCERS (sore throat, sores in mouth, or a toothache) UNUSUAL RASH, SWELLING OR PAIN  UNUSUAL VAGINAL DISCHARGE OR ITCHING   Items with * indicate a potential emergency and should be followed up as soon as possible or go to the Emergency Department if any problems should occur.  Please show the CHEMOTHERAPY ALERT CARD or IMMUNOTHERAPY ALERT CARD at check-in to  the Emergency Department and triage nurse.  Should you have questions after your visit or need to cancel or reschedule your appointment, please contact [facility]  Dept: [phone]  and follow the prompts.  Office hours are 8:00 a.m. to 4:30 p.m. Monday - Friday. Please note that voicemails left after 4:00 p.m. may not be returned until the following business day.  We are closed weekends and major holidays. You have access to a nurse at all times for urgent questions. Please call the main number to the clinic Dept: [phone] and follow the prompts.   For any non-urgent questions, you may also contact your provider using MyChart. We now offer e-Visits for anyone 11 and older to request care online for non-urgent symptoms. For details visit [email or web address].   Also download the MyChart app! Go to the app store, search "MyChart", open the app, select [facility], and log in with your MyChart username and password.  Due to Covid, a mask is required upon entering the hospital/clinic. If you do not have a mask, one will be given to you upon arrival. For doctor visits, patients may have 1 support person aged 103 or older with them. For treatment visits, patients cannot have anyone with them due to current Covid guidelines and our immunocompromised population.   Zoledronic Acid Injection (Hypercalcemia, Oncology) What is this medication? ZOLEDRONIC ACID (ZOE le dron ik AS id) slows calcium loss from bones. It high calcium levels in the blood from some kinds of cancer. It may be used in otherpeople at risk for bone loss. This medicine may be used for  other purposes; ask your health care provider orpharmacist if you have questions. COMMON BRAND NAME(S): Zometa What should I tell my care team before I take this medication? They need to know if you have any of these conditions: cancer dehydration dental disease kidney disease liver disease low levels of calcium in  the blood lung or breathing disease (asthma) receiving steroids like dexamethasone or prednisone an unusual or allergic reaction to zoledronic acid, other medicines, foods, dyes, or preservatives pregnant or trying to get pregnant breast-feeding How should I use this medication? This drug is injected into a vein. It is given by a health care provider in [place] or clinic setting. Talk to your health care provider about the use of this drug in children.Special care may be needed. Overdosage: If you think you have taken too much of this medicine contact apoison control center or emergency room at once. NOTE: This medicine is only for you. Do not share this medicine with others. What if I miss a dose? Keep appointments for follow-up doses. It is important not to miss your dose.Call your health care provider if you are unable to keep an appointment. What may interact with this medication? certain antibiotics given by injection NSAIDs, medicines for pain and inflammation, like ibuprofen or naproxen some diuretics like bumetanide, furosemide teriparatide thalidomide This list may not describe all possible interactions. Give your health care provider a list of all the medicines, herbs, non-prescription drugs, or dietary supplements you use. Also tell them if you smoke, drink alcohol, or use illegaldrugs. Some items may interact with your medicine. What should I watch for while using this medication? Visit your health care provider for regular checks on your progress. It may besome time before you see the benefit from this drug. Some people who take this drug have severe bone, joint, or muscle pain. This drug may also increase your risk for jaw problems or a broken thigh bone. Tell your health care provider right away if you have severe pain in your jaw, bones, joints, or muscles. Tell you health care provider if you have any painthat does not go away or that gets worse. Tell your dentist and dental  surgeon that you are taking this drug. You should not have major dental surgery while on this drug. See your dentist to have a dental exam and fix any dental problems before starting this drug. Take good care of your teeth while on this drug. Make sure you see your dentist forregular follow-up appointments. You should make sure you get enough calcium and vitamin D while you are taking this drug. Discuss the foods you eat and the vitamins you take with your healthcare provider. Check with your health care provider if you have severe diarrhea, nausea, and vomiting, or if you sweat a lot. The loss of too much body fluid may make itdangerous for you to take this drug. You may need blood work done while you are taking this drug. Do not become pregnant while taking this drug. Women should inform their health care provider if they wish to become pregnant or think they might be pregnant. There is potential for serious harm to an unborn child. Talk to your healthcare provider for more information. What side effects may I notice from receiving this medication? Side effects that you should report to your doctor or health care provider assoon as possible: allergic reactions (skin rash, itching or hives; swelling of the face, lips, or tongue) bone pain infection (fever, chills, cough, sore throat, pain  or trouble passing urine) jaw pain, especially after dental work joint pain kidney injury (trouble passing urine or change in the amount of urine) low blood pressure (dizziness; feeling faint or lightheaded, falls; unusually weak or tired) low calcium levels (fast heartbeat; muscle cramps or pain; pain, tingling, or numbness in the hands or feet; seizures) low magnesium levels (fast, irregular heartbeat; muscle cramp or pain; muscle weakness; tremors; seizures) low red blood cell counts (trouble breathing; feeling faint; lightheaded, falls; unusually weak or tired) muscle pain redness, blistering, peeling, or  loosening of the skin, including inside the mouth severe diarrhea swelling of the ankles, feet, hands trouble breathing Side effects that usually do not require medical attention (report to yourdoctor or health care provider if they continue or are bothersome): anxious constipation coughing depressed mood eye irritation, itching, or pain fever general ill feeling or flu-like symptoms nausea pain, redness, or irritation at site where injected trouble sleeping This list may not describe all possible side effects. Call your doctor for medical advice about side effects. You may report side effects to FDA at1-800-FDA-1088. Where should I keep my medication? This drug is given in a hospital or clinic. It will not be stored at home. NOTE: This sheet is a summary. It may not cover all possible information. If you have questions about this medicine, talk to your doctor, pharmacist, orhealth care provider.  2022 Elsevier/Gold Standard (2019-02-07 09:13:00)  Vitamin B12 Deficiency Vitamin B12 deficiency occurs when the body does not have enough vitamin B12, which is an important vitamin. The body needs this vitamin: To make red blood cells. To make DNA. This is the genetic material inside cells. To help the nerves work properly so they can carry messages from the brain to the body. Vitamin B12 deficiency can cause various health problems, such as a low red blood cell count (anemia) or nerve damage. What are the causes? This condition may be caused by: Not eating enough foods that contain vitamin B12. Not having enough stomach acid and digestive fluids to properly absorb vitamin B12 from the food that you eat. Certain digestive system diseases that make it hard to absorb vitamin B12. These diseases include Crohn's disease, chronic pancreatitis, and cystic fibrosis. A condition in which the body does not make enough of a protein (intrinsic factor), resulting in too few red blood cells (pernicious  anemia). Having a surgery in which part of the stomach or small intestine is removed. Taking certain medicines that make it hard for the body to absorb vitamin B12. These medicines include: Heartburn medicines (antacids and proton pump inhibitors). Certain antibiotic medicines. Some medicines that are used to treat diabetes, tuberculosis, gout, or high cholesterol. What increases the risk? The following factors may make you more likely to develop a B12 deficiency: Being older than age 43. Eating a vegetarian or vegan diet, especially while you are pregnant. Eating a poor diet while you are pregnant. Taking certain medicines. Having alcoholism. What are the signs or symptoms? In some cases, there are no symptoms of this condition. If the condition leads to anemia or nerve damage, various symptoms can occur, such as: Weakness. Fatigue. Loss of appetite. Weight loss. Numbness or tingling in your hands and feet. Redness and burning of the tongue. Confusion or memory problems. Depression. Sensory problems, such as color blindness, ringing in the ears, or loss of taste. Diarrhea or constipation. Trouble walking. If anemia is severe, symptoms can include: Shortness of breath. Dizziness. Rapid heart rate (tachycardia). How is this diagnosed?  This condition may be diagnosed with a blood test to measure the level of vitamin B12 in your blood. You may also have other tests, including: A group of tests that measure certain characteristics of blood cells (complete blood count, CBC). A blood test to measure intrinsic factor. A procedure where a thin tube with a camera on the end is used to look into your stomach or intestines (endoscopy). Other tests may be needed to discover the cause of B12 deficiency. How is this treated? Treatment for this condition depends on the cause. This condition may be treated by: Changing your eating and drinking habits, such as: Eating more foods that contain  vitamin B12. Drinking less alcohol or no alcohol. Getting vitamin B12 injections. Taking vitamin B12 supplements. Your health care provider will tell you which dosage is best for you. Follow these instructions at home: Eating and drinking  Eat lots of healthy foods that contain vitamin B12, including: Meats and poultry. This includes beef, pork, chicken, [place], and organ meats, such as liver. Seafood. This includes clams, rainbow trout, salmon, tuna, and haddock. Eggs. Cereal and dairy products that are fortified. This means that vitamin B12 has been added to the food. Check the label on the package to see if the food is fortified. The items listed above may not be a complete list of recommended foods and beverages. Contact a dietitian for more information. General instructions Get any injections that are prescribed by your health care provider. Take supplements only as told by your health care provider. Follow the directions carefully. Do not drink alcohol if your health care provider tells you not to. In some cases, you may only be asked to limit alcohol use. Keep all follow-up visits as told by your health care provider. This is important. Contact a health care provider if: Your symptoms come back. Get help right away if you: Develop shortness of breath. Have a rapid heart rate. Have chest pain. Become dizzy or lose consciousness. Summary Vitamin B12 deficiency occurs when the body does not have enough vitamin B12. The main causes of vitamin B12 deficiency include dietary deficiency, digestive diseases, pernicious anemia, and having a surgery in which part of the stomach or small intestine is removed. In some cases, there are no symptoms of this condition. If the condition leads to anemia or nerve damage, various symptoms can occur, such as weakness, shortness of breath, and numbness. Treatment may include getting vitamin B12 injections or taking vitamin B12 supplements. Eat lots of  healthy foods that contain vitamin B12. This information is not intended to replace advice given to you by your health care provider. Make sure you discuss any questions you have with your healthcare provider. Document Revised: 10/12/2018 Document Reviewed: 01/02/2018 Elsevier Patient Education  2022 [employer].

## 2020-11-28 DIAGNOSIS — Z510 Encounter for antineoplastic radiation therapy: Secondary | ICD-10-CM | POA: Diagnosis not present

## 2020-11-28 DIAGNOSIS — C7931 Secondary malignant neoplasm of brain: Secondary | ICD-10-CM | POA: Diagnosis not present

## 2020-11-28 DIAGNOSIS — Z853 Personal history of malignant neoplasm of breast: Secondary | ICD-10-CM | POA: Diagnosis not present

## 2020-12-01 ENCOUNTER — Telehealth: Payer: Self-pay | Admitting: Adult Health

## 2020-12-01 NOTE — Telephone Encounter (Signed)
Scheduled appointment per 07/22 los. Patient is aware.

## 2020-12-03 ENCOUNTER — Other Ambulatory Visit: Payer: Self-pay

## 2020-12-03 ENCOUNTER — Ambulatory Visit
Admission: RE | Admit: 2020-12-03 | Discharge: 2020-12-03 | Disposition: A | Discharge status: Home / Self Care | Payer: Medicare Other | Source: Ambulatory Visit | Attending: Radiation Oncology | Admitting: Radiation Oncology

## 2020-12-03 ENCOUNTER — Ambulatory Visit: Payer: Medicare Other | Admitting: Urology

## 2020-12-03 ENCOUNTER — Ambulatory Visit: Payer: Medicare Other | Admitting: Radiation Oncology

## 2020-12-03 VITALS — BP 150/60 | HR 76 | Temp 97.5°F | Resp 20 | Ht 62.0 in | Wt 112.6 lb

## 2020-12-03 DIAGNOSIS — Z510 Encounter for antineoplastic radiation therapy: Secondary | ICD-10-CM | POA: Diagnosis not present

## 2020-12-03 DIAGNOSIS — C7931 Secondary malignant neoplasm of brain: Secondary | ICD-10-CM | POA: Diagnosis not present

## 2020-12-03 DIAGNOSIS — I10 Essential (primary) hypertension: Secondary | ICD-10-CM | POA: Diagnosis not present

## 2020-12-03 DIAGNOSIS — C7951 Secondary malignant neoplasm of bone: Secondary | ICD-10-CM

## 2020-12-04 ENCOUNTER — Ambulatory Visit: Payer: Medicare Other

## 2020-12-04 ENCOUNTER — Ambulatory Visit: Payer: Medicare Other | Admitting: Adult Health

## 2020-12-04 ENCOUNTER — Other Ambulatory Visit: Payer: Medicare Other

## 2020-12-04 NOTE — Progress Notes (Signed)
  Radiation Oncology         ([phone]) [phone] ________________________________  Name: Heather [name] MRN: [id number]  Date: 12/03/2020  DOB: 1945/10/27  Chart Note:  Ms. Hinkle is currently receiving palliative stereotactic radiotherapy to 2 calvarial metastases including the occiput and left lateral orbit.  She has been experiencing increasing low back pain and recent CT and bone scan imaging suggest progressive disease at L1, L3 and the sacrum.  I asked to see the patient today to discuss potential palliative radiation to the low back.  She recently met in medical oncology with Wilber Bihari and was advised to try using her hydrocodone somewhat more liberally.  The patient has used hydrocodone earlier today and this helped her tolerate her radiation treatment today.  She reports that she has not really been using any pain medication.  Today, we spent approximately 30 minutes talking about the pros and cons of using her hydrocodone versus spot treating an increasing number of symptomatic bone metastases.  The patient would like to try using her hydrocodone on a more regular basis reserving radiation for breakthrough pain in that setting.  It is worth noting as well that we discussed her upcoming appointment with Dr. Lane Hacker from palliative care.  We talked about the patient's journey through numerous courses of radiation therapy as well as numerous forms of systemic treatment.  At this point, she does appear to have widespread progressive skeletal metastases and other metastases.  Her quality of life has become somewhat limited in terms of mobility although she is cognitively quite sharp.  She understands that we may be reaching a point in her cancer journey where we are approaching a transition from active treatment to supportive care and she accepts that this may be soon.  She did explain some concern about safety at home with regard to risk for fall when getting out of bed or in the shower.  We  talked a little bit about how an event like that could lead to a 911 call and a prolonged hospital stay.  The patient understands that she does not wish to be hospitalized for an increasingly complex terminal disease and she looks forward to talking with Dr. Hilma Favors about this in terms of goals of care, palliative supportive care and potentially pivoting from active treatment.  ________________________________  Sheral Apley. [name], M.D.

## 2020-12-07 ENCOUNTER — Other Ambulatory Visit: Payer: Self-pay

## 2020-12-07 ENCOUNTER — Ambulatory Visit
Admission: RE | Admit: 2020-12-07 | Discharge: 2020-12-07 | Disposition: A | Discharge status: Home / Self Care | Payer: Medicare Other | Source: Ambulatory Visit | Attending: Radiation Oncology | Admitting: Radiation Oncology

## 2020-12-07 DIAGNOSIS — Z510 Encounter for antineoplastic radiation therapy: Secondary | ICD-10-CM | POA: Insufficient documentation

## 2020-12-07 DIAGNOSIS — C7931 Secondary malignant neoplasm of brain: Secondary | ICD-10-CM | POA: Diagnosis not present

## 2020-12-07 DIAGNOSIS — C50411 Malignant neoplasm of upper-outer quadrant of right female breast: Secondary | ICD-10-CM | POA: Diagnosis not present

## 2020-12-09 ENCOUNTER — Ambulatory Visit
Admission: RE | Admit: 2020-12-09 | Discharge: 2020-12-09 | Disposition: A | Discharge status: Home / Self Care | Payer: Medicare Other | Source: Ambulatory Visit | Attending: Radiation Oncology | Admitting: Radiation Oncology

## 2020-12-09 ENCOUNTER — Inpatient Hospital Stay: Payer: Medicare Other

## 2020-12-09 ENCOUNTER — Inpatient Hospital Stay: Payer: Medicare Other | Attending: Hematology and Oncology | Admitting: Internal Medicine

## 2020-12-09 ENCOUNTER — Other Ambulatory Visit: Payer: Medicare Other

## 2020-12-09 ENCOUNTER — Other Ambulatory Visit: Payer: Self-pay

## 2020-12-09 DIAGNOSIS — C50411 Malignant neoplasm of upper-outer quadrant of right female breast: Secondary | ICD-10-CM | POA: Insufficient documentation

## 2020-12-09 DIAGNOSIS — Z9221 Personal history of antineoplastic chemotherapy: Secondary | ICD-10-CM | POA: Insufficient documentation

## 2020-12-09 DIAGNOSIS — C7931 Secondary malignant neoplasm of brain: Secondary | ICD-10-CM

## 2020-12-09 DIAGNOSIS — Z510 Encounter for antineoplastic radiation therapy: Secondary | ICD-10-CM | POA: Diagnosis not present

## 2020-12-09 DIAGNOSIS — Z170 Estrogen receptor positive status [ER+]: Secondary | ICD-10-CM | POA: Insufficient documentation

## 2020-12-09 DIAGNOSIS — Z9011 Acquired absence of right breast and nipple: Secondary | ICD-10-CM | POA: Insufficient documentation

## 2020-12-09 DIAGNOSIS — Z79811 Long term (current) use of aromatase inhibitors: Secondary | ICD-10-CM | POA: Insufficient documentation

## 2020-12-09 DIAGNOSIS — C7951 Secondary malignant neoplasm of bone: Secondary | ICD-10-CM | POA: Diagnosis not present

## 2020-12-09 DIAGNOSIS — Z923 Personal history of irradiation: Secondary | ICD-10-CM | POA: Insufficient documentation

## 2020-12-09 DIAGNOSIS — C50911 Malignant neoplasm of unspecified site of right female breast: Secondary | ICD-10-CM | POA: Diagnosis not present

## 2020-12-09 DIAGNOSIS — Z5112 Encounter for antineoplastic immunotherapy: Secondary | ICD-10-CM | POA: Insufficient documentation

## 2020-12-09 MED ORDER — CALCITONIN (SALMON) 200 UNIT/ACT NA SOLN: 1.0000 | Freq: Every day | NASAL | 12 refills | Status: AC

## 2020-12-09 MED ORDER — TAMSULOSIN HCL 0.4 MG PO CAPS: 0.4000 mg | ORAL_CAPSULE | Freq: Every day | ORAL | 3 refills | Status: AC

## 2020-12-09 MED ORDER — DEXAMETHASONE 4 MG PO TABS: 4.0000 mg | ORAL_TABLET | Freq: Every day | ORAL | 1 refills | Status: DC

## 2020-12-09 NOTE — Progress Notes (Addendum)
[facility] Follow up:    Heather Stains, MD [place] [address]   DIAGNOSIS: Cancer Staging Primary cancer of upper outer quadrant of right female breast [facility]) Staging form: Breast, AJCC 7th Edition - Clinical: Stage IIB (T2, N1, cM0) - Unsigned Specimen type: Core Needle Biopsy Histopathologic type: 9931 Laterality: Right Staging comments: Staged at breast conference 11.6.13  - Pathologic: Stage IV (M1) - Signed by [name], NP on 06/22/2018 Specimen type: Core Needle Biopsy Histopathologic type: 9931 Laterality: Right   SUMMARY OF ONCOLOGIC HISTORY: Oncology History  Primary cancer of upper outer quadrant of right female breast ([place])  03/08/2012 Initial Diagnosis   Right breast invasive ductal carcinoma ER positive PR negative HER-2 positive Ki-67 44%; another breast mass biopsied in the anterior part of the breast which was also positive for malignancy that was HER-2 negative    03/14/2012 Cancer Staging   Staging form: Breast, AJCC 7th Edition - Pathologic: Stage IV (M1) - Signed by [name], NP on 06/22/2018    05/10/2012 Surgery   Right mastectomy and axillary lymph node dissection: Multifocal disease 5 cm, 1.7 cm, 1.6 cm, ER positive PR negative HER-2 positive Ki-67 44%, 3/7 lymph nodes positive    06/14/2012 - 06/12/2013 Chemotherapy   Adjuvant chemotherapy with [place] 6 followed by Herceptin maintenance    10/25/2012 - 12/11/2012 Radiation Therapy   Adjuvant radiation therapy    02/07/2013 -  Anti-estrogen oral therapy   Letrozole 2.5 mg daily    02/14/2015 Imaging   MRI spine: Large destructive T5 lesion with severe pathologic compression fracture and extensive epidural tumor, severe spinal stenosis with moderate cord compression, tumor involvement of T4    03/18/2015 PET scan   Residual enhancing soft tissue adjacent to the spinal cord. No evidence of metastatic disease. Nonspecific  uptake the left nipple    03/27/2015 - 03/30/2015 Hospital Admission   T4-6 decompression for spinal cord compression (lower extremity paralysis)    04/10/2015 -  Chemotherapy   Palliative treatment with Herceptin every 3 weeks with letrozole 2.5 mg daily    04/13/2015 - 05/19/2015 Radiation Therapy   Palliative radiation treatment to the spine    09/01/2015 Imaging   CT chest abdomen pelvis: Pathologic fracture with posterior fusion at T5 no other evidence of metastatic disease in the chest abdomen pelvis    12/23/2015 Imaging   CT CAP: new nonspecific 0.6 cm lymph node was stated mediastinum needs follow-up CT, innumerable tiny groundglass pulmonary nodules throughout both lungs unchanged, patchy consolidation from radiation, T5 fracture, no mets    04/15/2016 PET scan   Rt para-spinal mass mildy hypermetabolic SUV 5.9, lytic cortical lesion 4.8 cm lesion left prox femur suv 8.8 favor osseus met disease    04/28/2016 - 05/12/2016 Radiation Therapy   Palliative XRT Left femur    05/24/2016 Procedure   Soft tissue mass biopsy back: Metastatic breast cancer ER 80%, PR 0%, Ki-67 50%, HER-2 positive ratio 2.25    06/09/2016 - 03/29/2017 Anti-estrogen oral therapy   Faslodex with Herceptin and Perjeta every 4 weeks    06/13/2016 - 06/15/2016 Hospital Admission   uncomplicated revision of previous thoracic instrumentation.    10/10/2016 PET scan   Interval development of new hypermetabolic 2.5 x 1.1 cm lesion posterior right eighth rib consistent with metastatic disease, stable right paraspinal lesion at T8, clustered soft tissue nodules in the posterior midline back near cervicothoracic junction smaller than before  11/03/2016 - 11/07/2016 Radiation Therapy   Palliative radiation to right eighth rib    11/04/2016 Imaging   Solitary contrast-enhancing lesion in the right cerebellum 6 x 2 mm concerning for metastatic lesion    11/24/2016 - 11/24/2016 Radiation Therapy   SRS to the  brain lesion    04/18/2017 PET scan   New ill-defined rounded airspace opacity in posterior right lower lobe. Differential diagnosis includes malignancy and infectious/inflammatory process.Increased hypermetabolic lytic lesion in T8 vertebral body, consistent with bone metastasis.  Increased hypermetabolic posterior upper chest wall subcutaneous nodules, highly suspicious metastatic disease. Further improvement in right posterior eighth rib metastasis.    05/04/2017 - 07/27/2017 Chemotherapy   Kadcyla every 3 weeks palliative chemotherapy stopped for progression    07/26/2017 Imaging   Interval increase in size of the multiple enhancing masses posteriorly overlying the spine, interval development of left axillary and mediastinal adenopathy, increase in the size of lytic lesions in T8-T9 and left ninth rib, increased groundglass opacities right lower lobe lung    07/27/2017 - 05/11/2018 Chemotherapy   Palbociclib + Faslodex + trastuzumab + Pertuzumab     04/03/2018 PET scan   PET CT scan showed progression of disease not only in the bones but also in the subcutaneous nodules.    05/15/2018 - 05/25/2018 Radiation Therapy   Palliative radiation to the back    06/01/2018 - 12/27/2019 Chemotherapy   Enhertu (Transtuzumab Deruxtecan)     08/07/2018 - 08/07/2018 Radiation Therapy   T12 vertebral body SRS    01/2020 - 10/23/2020 Chemotherapy    Xeloda, Tucatinib, Herceptin    11/19/2020 -  Chemotherapy    Patient is on Treatment Plan: BREAST MARGETUXIMAB + VINORELBINE Q21D       Carcinoma of right breast, stage 4 (HCC)  02/14/2015 Initial Diagnosis   Breast cancer metastasized to bone, right ([place])    06/01/2018 - 02/07/2020 Chemotherapy   The patient had palonosetron (ALOXI) injection 0.25 mg, 0.25 mg, Intravenous,  Once, 24 of 30 cycles Administration: 0.25 mg (06/01/2018), 0.25 mg (06/22/2018), 0.25 mg (08/24/2018), 0.25 mg (07/13/2018), 0.25 mg (08/03/2018), 0.25 mg (09/14/2018), 0.25 mg  (10/05/2018), 0.25 mg (11/16/2018), 0.25 mg (10/26/2018), 0.25 mg (12/07/2018), 0.25 mg (12/28/2018), 0.25 mg (01/18/2019), 0.25 mg (03/15/2019), 0.25 mg (02/15/2019), 0.25 mg (04/12/2019), 0.25 mg (05/09/2019), 0.25 mg (06/07/2019), 0.25 mg (07/05/2019), 0.25 mg (08/09/2019), 0.25 mg (09/13/2019), 0.25 mg (10/18/2019), 0.25 mg (11/22/2019), 0.25 mg (12/27/2019) fam-trastuzumab deruxtecan-nxki (ENHERTU) 332 mg in dextrose 5 % 100 mL chemo infusion, 5.4 mg/kg = 332 mg, Intravenous,  Once, 24 of 30 cycles Dose modification: 4.4 mg/kg (original dose 5.4 mg/kg, Cycle 7, Reason: Provider Judgment), 3.2 mg/kg (original dose 5.4 mg/kg, Cycle 9, Reason: Dose not tolerated), 150 mg (original dose 5.4 mg/kg, Cycle 18, Reason: Dose not tolerated) Administration: 332 mg (06/01/2018), 332 mg (06/22/2018), 332 mg (07/13/2018), 332 mg (08/24/2018), 332 mg (08/03/2018), 332 mg (09/14/2018), 272 mg (10/05/2018), 200 mg (11/16/2018), 272 mg (10/26/2018), 200 mg (12/07/2018), 200 mg (12/28/2018), 200 mg (01/18/2019), 200 mg (03/15/2019), 200 mg (02/15/2019), 200 mg (04/12/2019), 200 mg (05/09/2019), 200 mg (06/07/2019), 150 mg (07/05/2019), 150 mg (08/09/2019), 150 mg (09/13/2019), 150 mg (10/18/2019), 150 mg (11/22/2019), 150 mg (12/27/2019)   for chemotherapy treatment.     05/29/2020 - 11/13/2020 Chemotherapy    Patient is on Treatment Plan: BREAST MARGETUXIMAB + VINORELBINE Q21D       Metastatic cancer to spine ([place])  Brain metastasis ([place])  11/18/2016 Initial Diagnosis   Brain metastasis ([place])  05/29/2020 - 11/13/2020 Chemotherapy    Patient is on Treatment Plan: BREAST MARGETUXIMAB + VINORELBINE Q21D         CURRENT THERAPY: Navelbine/Margetuximab  INTERVAL HISTORY: Heather [name] 75 y.o. female returns for evlauation prior to receiving her second cycle of Navelbine and Margetuximab.  She receives this dose reduced and receives the Margetuximab and Navelbine on day 1 of a 21 day cycle and navelbine alone on day 8 of a 21 day cycle.  She tolerates  this moderately well, however she notes she was so fatigued after the 106m of oral compazine that she could not stay awake long enough to visit with her grandchildren.    ETaneilfeels tired and jittery today.  She met with Dr. GHilma Favorsin palliative care yesterday.  Her note is not yet complete, however she did order some nasal spray Calcitonin and flowmax to help with her urination.  Verniece noted that she is tired, and that if this treatment doesn't work, she is not willing to try anything else.    EAshannadoes have advanced directives in place and her husband JJeneen Rinksis her health care power of attorney.    Patient Active Problem List   Diagnosis Date Noted   Bone metastasis ([facility] [place] 02/02/2020   Port-A-Cath in place 07/13/2018   Goals of care, counseling/discussion 08/10/2017   Brain metastasis ([place] 11/18/2016   Genetic testing 07/11/2016   ALLERGY TO CHLORHEXADINE, USE BETADINE ONLY 06/23/2016   Kyphosis of thoracic region 06/13/2016   Plantar fasciitis, bilateral 04/20/2016   Neuropathic pain 01/27/2016   Anemia of chronic disease 05/21/2015   Metastatic cancer to spine ([place] 03/27/2015   Thrombocytopenia ([place] 03/12/2015   Prerenal azotemia 03/12/2015   Pain    Recurrent UTI--due to Klebsiella    Paraplegia at T4 level ([place] 02/20/2015   Neurogenic bowel 02/20/2015   Neurogenic bladder 02/20/2015   Postoperative anemia due to acute blood loss 02/17/2015   Spinal cord compression due to malignant neoplasm metastatic to spine ([facility] 02/15/2015   Epidural mass 02/15/2015   Thoracic spine tumor    Pathologic fracture of vertebra 02/14/2015   Pathologic fracture of thoracic vertebrae 02/14/2015   Carcinoma of right breast, stage 4 ([place] 02/14/2015   Hyperlipidemia 02/14/2015   H. pylori infection    HTN (hypertension) 04/17/2012   Primary cancer of upper outer quadrant of right female breast ([facility] 03/08/2012    is allergic to other, acyclovir and related, brompheniramine,  chlorhexidine, dextromethorphan, monascus purpureus went yeast, phenylephrine hcl, pravastatin sodium, tegaderm ag mesh [silver], zanaflex [tizanidine hcl], codeine, keflex [cephalexin], naprosyn [naproxen], oxycodone, tramadol, and tussin [guaifenesin].  MEDICAL HISTORY: Past Medical History:  Diagnosis Date   Anemia    hx of   Anxiety    r/t updated surgery   Breast cancer ([place]    right   Cataract    immature-not sure of which eye   Complication of anesthesia    pt states she is sensitive to meds   Dizziness    has been going on for 379monthmedical MD aware   Genetic testing 07/11/2016   Test Results: Normal; No pathogenic mutations detected  Genes Analyzed: 43 genes on [employer]'s Common Cancers panel (APC, ATM, AXIN2, BARD1, BMPR1A, BRCA1, BRCA2, BRIP1, CDH1, CDKN2A, CHEK2, DICER1, EPCAM, GREM1, HOXB13, KIT, MEN1, MLH1, MSH2, MSH6, MUTYH, NBN, NF1, PALB2, PDGFRA, PMS2, POLD1, POLE, PTEN, RAD50, RAD51C, RAD51D, SDHA, SDHB, SDHC, SDHD, SMAD4, SMARCA4, STK11, TP53, TSC1, TSC2, VHL).   GERD (gastroesophageal reflux disease)  Tums prn   H. pylori infection    H/O hiatal hernia    Hemorrhoids    History of UTI    Hx of radiation therapy 10/25/12- 12/11/12   right chest wall/regional lymph nodes 5040 cGy, 28 sessions, right chest wall boost 1000 cGy 1 session   Hx: UTI (urinary tract infection)    Hyperlipidemia    but not on meds;diet and exercise controlled   Hypertension    recently started Aldactone    Insomnia    takes Melatoniin daily   Metastasis to spinal column (HCC)    T5, Left  Femur cancer- radiation    Neuropathy    "from back surgery"   PONV (postoperative nausea and vomiting)    pt experienced hair loss, confusion and combative- 02/15/15   Right knee pain    Right shoulder pain    Skin cancer    squamous, Nodule to back - "same cancer as breast."   Urinary frequency    d/t taking Aldactone    SURGICAL HISTORY: Past Surgical History:  Procedure Laterality Date    COLONOSCOPY     cyst removed from left breast  [place] 4 N/A 02/15/2015   Procedure: DECOMPRESSION T5 AND T3-T7 STABALIZATION;  Surgeon: Consuella Lose, MD;  Location: [facility];  Service: Neurosurgery;  Laterality: N/A;   ESOPHAGOGASTRODUODENOSCOPY     LAMINECTOMY N/A 03/27/2015   Procedure: Thoracic Four-Thoracic Six Laminectomy for tumor;  Surgeon: Consuella Lose, MD;  Location: [facility];  Service: Neurosurgery;  Laterality: N/A;  T4-T6 Laminectomy   left wrist surgery   2004   with plate   MASTECTOMY MODIFIED RADICAL  05/10/2012   Procedure: MASTECTOMY MODIFIED RADICAL;  Surgeon: Merrie Roof, MD;  Location: [facility];  Service: General;  Laterality: Right;  RIGHT MODIFIED RADICAL MASTECTOMY   MASTECTOMY, RADICAL Right    MINOR BREAST BIOPSY Left 05/16/2016   Procedure: EXCISION OF BACK NODULE;  Surgeon: Autumn Messing III, MD;  Location: [facility];  Service: General;  Laterality: Left;   Nodule  back  05/17/2015   PORTACATH PLACEMENT  05/10/2012   Procedure: INSERTION PORT-A-CATH;  Surgeon: Merrie Roof, MD;  Location: [facility];  Service: General;  Laterality: Left;    SOCIAL HISTORY: Social History   Socioeconomic History   Marital status: Married    Spouse name: Not on file   Number of children: 2   Years of education: Not on file   Highest education level: Not on file  Occupational History   Not on file  Tobacco Use   Smoking status: Never   Smokeless tobacco: Never  Vaping Use   Vaping Use: Never used  Substance and Sexual Activity   Alcohol use: No   Drug use: No   Sexual activity: Yes    Birth control/protection: Post-menopausal  Other Topics Concern   Not on file  Social History Narrative   Not on file   Social Determinants of Health   Financial Resource Strain: Not on file  Food Insecurity: Not on file  Transportation Needs: Not on file  Physical Activity: Not on file  Stress: Not on file  Social  Connections: Not on file  Intimate Partner Violence: Not on file    FAMILY HISTORY: Family History  Problem Relation Age of Onset   Leukemia Mother 61       deceased 74   Stroke Father    Diabetes Mellitus II Father  Prostate cancer Brother 67       currently 28   Stomach cancer Maternal Grandmother 81       deceased 26   Prostate cancer Brother 75       currently 60    Review of Systems  Constitutional:  Positive for fatigue. Negative for appetite change, chills, fever and unexpected weight change.  HENT:   Positive for mouth sores. Negative for hearing loss, lump/mass and trouble swallowing.   Eyes:  Negative for eye problems and icterus.  Respiratory:  Negative for chest tightness, cough and shortness of breath.   Cardiovascular:  Negative for chest pain, leg swelling and palpitations.  Gastrointestinal:  Negative for abdominal distention, abdominal pain, constipation, diarrhea, nausea and vomiting.  Endocrine: Negative for hot flashes.  Genitourinary:  Positive for difficulty urinating.   Musculoskeletal:  Positive for arthralgias, back pain and neck pain.  Skin:  Negative for itching and rash.  Neurological:  Negative for dizziness, extremity weakness, headaches and numbness.  Hematological:  Negative for adenopathy. Does not bruise/bleed easily.  Psychiatric/Behavioral:  Negative for depression. The patient is not nervous/anxious.      PHYSICAL EXAMINATION  ECOG PERFORMANCE STATUS: 3 - Symptomatic, >50% confined to bed  Vitals:   12/10/20 1230  BP: (!) 141/52  Pulse: 87  Resp: 18  Temp: 98.4 F (36.9 C)  SpO2: 98%    Physical Exam Constitutional:      General: She is not in acute distress.    Appearance: Normal appearance. She is not toxic-appearing.     Comments: Examined in wheelchair, appears tired, tearful  HENT:     Head: Normocephalic and atraumatic.  Eyes:     General: No scleral icterus. Cardiovascular:     Rate and Rhythm: Normal rate and  regular rhythm.     Pulses: Normal pulses.     Heart sounds: Normal heart sounds.  Pulmonary:     Effort: Pulmonary effort is normal.     Breath sounds: Normal breath sounds.  Abdominal:     General: Abdomen is flat. Bowel sounds are normal.     Palpations: Abdomen is soft.  Musculoskeletal:        General: No swelling.     Cervical back: Neck supple.  Lymphadenopathy:     Cervical: No cervical adenopathy.  Skin:    General: Skin is warm and dry.     Findings: No rash.  Neurological:     General: No focal deficit present.     Mental Status: She is alert.  Psychiatric:        Mood and Affect: Mood normal.        Behavior: Behavior normal.    LABORATORY DATA:  CBC    Component Value Date/Time   WBC 2.1 (L) 12/10/2020 1156   WBC 6.2 07/27/2017 0836   RBC 3.81 (L) 12/10/2020 1156   HGB 11.7 (L) 12/10/2020 1156   HGB 12.0 05/05/2017 1046   HCT 35.4 (L) 12/10/2020 1156   HCT 36.9 05/05/2017 1046   PLT 252 12/10/2020 1156   PLT 210 05/05/2017 1046   MCV 92.9 12/10/2020 1156   MCV 83.1 05/05/2017 1046   MCH 30.7 12/10/2020 1156   MCHC 33.1 12/10/2020 1156   RDW 14.7 12/10/2020 1156   RDW 14.8 (H) 05/05/2017 1046   LYMPHSABS 0.4 (L) 12/10/2020 1156   LYMPHSABS 0.6 (L) 05/05/2017 1046   MONOABS 0.2 12/10/2020 1156   MONOABS 0.3 05/05/2017 1046   EOSABS 0.0 12/10/2020 1156  EOSABS 0.1 05/05/2017 1046   BASOSABS 0.0 12/10/2020 1156   BASOSABS 0.1 05/05/2017 1046    CMP     Component Value Date/Time   NA 136 12/10/2020 1156   NA 137 05/05/2017 1046   K 4.1 12/10/2020 1156   K 3.9 05/05/2017 1046   CL 100 12/10/2020 1156   CL 101 10/25/2012 0946   CO2 23 12/10/2020 1156   CO2 26 05/05/2017 1046   GLUCOSE 140 (H) 12/10/2020 1156   GLUCOSE 88 05/05/2017 1046   GLUCOSE 90 10/25/2012 0946   BUN 19 12/10/2020 1156   BUN 14.1 05/05/2017 1046   CREATININE 0.90 12/10/2020 1156   CREATININE 0.8 05/05/2017 1046   CALCIUM 13.5 (HH) 12/10/2020 1156   CALCIUM 10.3  05/05/2017 1046   PROT 7.4 12/10/2020 1156   PROT 7.2 05/05/2017 1046   ALBUMIN 3.5 12/10/2020 1156   ALBUMIN 3.7 05/05/2017 1046   AST 63 (H) 12/10/2020 1156   AST 26 05/05/2017 1046   ALT 23 12/10/2020 1156   ALT 17 05/05/2017 1046   ALKPHOS 209 (H) 12/10/2020 1156   ALKPHOS 74 05/05/2017 1046   BILITOT 0.7 12/10/2020 1156   BILITOT 0.43 05/05/2017 1046   GFRNONAA >60 12/10/2020 1156   GFRAA >60 02/07/2020 0913      ASSESSMENT and THERAPY PLAN:   Primary cancer of upper outer quadrant of right female breast ([place]) Right breast invasive ductal carcinoma ER positive PR negative HER-2 positive Ki-67 44% multifocal disease 3/7 lymph nodes positive T2 N1 M0 stage IIB status post adjuvant chemotherapy with TCH followed by Herceptin maintenance, adjuvant radiation therapy and Letrozole. MRI 02/14/2015: T5 large destructive lesion with pathologic compression fracture and extensive epidural tumor, involvement of T4, excision metastatic carcinoma ER 60%, PR 0%, HER-2 positive ratio 2.71 average copy #7.45 03/27/2015: T4-6 decompression surgery for spinal cord compression PET-CT 04/17/16 Rt para-spinal mass mildy hypermetabolic SUV 5.9, lytic cortical lesion 4.8 cm lesion left prox femur suv 8.8 favor osseus met disease Echo 04/12/2017 shows well preserved ejection fraction of 55-60%    Treatment summary: 1. Resumed anastrozole 05/21/2015, but it has caused significant hair loss so we switched her to exemestane 25 mg once daily 07/23/2015 2. Herceptin every 3 week started 04/10/2015 3. Bone metastases: Zometa every 3 months 4. Radiation therapy to the spine (Dr. [name]) 04/13/2015 to 05/19/2015 5. Radiation therapy to left femur 04/28/2016 to 05/12/2016 6. SRS to the brain lesion 11/24/2016 7.  Kadcyla started 05/05/2017 discontinued 07/27/2017 8.Palbociclib + Faslodex + trastuzumab + Pertuzumab started 08/10/2017, stopped 04/04/2018 9. XRT to T12 07/19/2018 10. Enhertu started  06/01/18-12/27/2019 stopped for progression 11 SRS to brain January 2021 12.  Lapatinib with anastrozole stopped for progression 13.  Xeloda, Tucatinib, Herceptin 14. Margetuximab with Navelbine 11/19/2020 -------------------------------------------------------------------------------------------------------------------------- Current treatment: Margetuximab with Navelbine cycle 2 day 1  Heather [name] is not feeling well today.  Her calcium is elevated.  She will receive Zometa and IV fluids today, she notes her oral intake is decreased. I reviewed with Heather [name] the Zometa dose and placed orders for 33m for her hypercalcemia treatment.  Heather [name] also like to receive b12 today, and it is ok for her to receive this early, so I placed orders for this.    She will also proceed with her second cycle of Margetuximab with Navelbine.  She and I reviewed that she can stop treatment at any point.  She would like to receive this, determine if it is working and then if it is not, she  would opt to pursue hospice at that point.    I ordered CT scans for 01/12/2021 with f/u with [name] on 01/14/2021 to discuss next steps.  She is unsure what she is supposed to do with her pain control and I sent Dr. Hilma Favors a message to ask for guidance on what she reviewed with her.    I did encourage Tiari to go ahead and pick up the calcitonin, flowmax, and other meds that were called in by Dr. Hilma Favors.    Heather [name] will return in 1 week for labs, f/u and her next appointment.  She knows to call for any questions that may arise between now and her next appointment.  We are happy to see her sooner if needed.   The patient met with [name] and discussed the above as well.  See his addendum for additions.  All questions were answered. The patient knows to call the clinic with any problems, questions or concerns. We can certainly see the patient much sooner if necessary.  Total encounter time: 30 minutes*  Wilber Bihari, NP  12/10/20 1:30 PM Medical Oncology and Hematology [facility] [place], [place] [address] Tel. [phone]    Fax. [phone]  *Total Encounter Time as defined by the Centers for Medicare and Medicaid Services includes, in addition to the face-to-face time of a patient visit (documented in the note above) non-face-to-face time: obtaining and reviewing outside history, ordering and reviewing medications, tests or procedures, care coordination (communications with other health care professionals or caregivers) and documentation in the medical record.  Attending Note  I personally saw and examined Heather [name]. The plan of care was discussed with her. I agree with the physical exam findings and assessment and plan as documented above. I performed the majority of the counseling and assessment and plan regarding this encounter Metastatic breast cancer progress to multiple lines of therapy currently on [place]et been with margetuximab.  Today is cycle 2-day 1. Profound fatigue and decreased appetite and losing weight: Patient is very concerned that the treatment is not working and that the cancer may be progressing.  She feels that she is coming to the end of her life and that she will not be pursuing much more of treatments if the scans to be done after 3 cycles of chemo showed progression of disease. Chronic pain issues Prognosis: Extremely poor Hypercalcemia: Zometa being given today.  Signed Harriette Ohara, MD

## 2020-12-09 NOTE — Assessment & Plan Note (Addendum)
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 MRI 02/14/2015: T5 large destructive lesion with pathologic compression fracture and extensive epidural tumor, involvement of T4, excision metastatic carcinoma ER 60%, PR 0%, HER-2 positive ratio 2.71 average copy #7.45 03/27/2015: T4-6 decompression surgery for spinal cord compression PET-CT 12/10/17Rt para-spinal mass mildy hypermetabolic SUV 5.9, lytic cortical lesion 4.8 cm lesion left prox femur suv 8.8 favor osseus met disease Echo 04/12/2017 shows well preserved ejection fraction of 55-60%  Treatment summary: 1. Resumed anastrozole 05/21/2015, but it has caused significant hair loss so we switched her to exemestane 25 mg once daily 07/23/2015 2. Herceptin every 3 week started 04/10/2015 3. Bone metastases: Zometa every 3 months 4. Radiation therapy to the spine (Dr. [name]) 04/13/2015 to 05/19/2015 5. Radiation therapy to left femur 04/28/2016 to 05/12/2016 6. SRS to the brain lesion 11/24/2016 7.Kadcyla started 05/05/2017 discontinued 07/27/2017 8.Palbociclib +Faslodex +trastuzumab+ Pertuzumabstarted 08/10/2017, stopped 04/04/2018 9.XRT to T123/04/2019 10. Enhertu started 06/01/18-12/27/2019 stopped for progression 11SRS to brain January 2021 12.Lapatinib with anastrozole stopped for progression 13.  Xeloda, Tucatinib, Herceptin 14. Margetuximab with Navelbine 11/19/2020 -------------------------------------------------------------------------------------------------------------------------- Current treatment: Margetuximab with Navelbine cycle 2 day 1  Heather [name] is not feeling well today.  Her calcium is elevated.  She will receive Zometa and IV fluids today, she notes her oral intake is decreased. I reviewed with  Heather [name] the Zometa dose and placed orders for 46m for her hypercalcemia treatment.  EMarialywould also like to receive b12 today, and it is ok for her to receive this early, so I placed orders for this.    She will also proceed with her second cycle of Margetuximab with Navelbine.  She and I reviewed that she can stop treatment at any point.  She would like to receive this, determine if it is working and then if it is not, she would opt to pursue hospice at that point.    I ordered CT scans for 01/12/2021 with f/u with Dr. GLindi Adieon 01/14/2021 to discuss next steps.  She is unsure what she is supposed to do with her pain control and I sent Dr. GHilma Favorsa message to ask for guidance on what she reviewed with her.    I did encourage Heather [name] to go ahead and pick up the calcitonin, flowmax, and other meds that were called in by Dr. GHilma Favors    ENyoka Cowdenwill return in 1 week for labs, f/u and her next appointment.  She knows to call for any questions that may arise between now and her next appointment.  We are happy to see her sooner if needed.

## 2020-12-10 ENCOUNTER — Inpatient Hospital Stay: Payer: Medicare Other

## 2020-12-10 ENCOUNTER — Encounter: Payer: Self-pay | Admitting: Adult Health

## 2020-12-10 ENCOUNTER — Inpatient Hospital Stay (HOSPITAL_BASED_OUTPATIENT_CLINIC_OR_DEPARTMENT_OTHER): Payer: Medicare Other | Admitting: Adult Health

## 2020-12-10 ENCOUNTER — Encounter: Payer: Self-pay | Admitting: Internal Medicine

## 2020-12-10 VITALS — BP 141/52 | HR 87 | Temp 98.4°F | Resp 18 | Ht 62.0 in | Wt 109.4 lb

## 2020-12-10 DIAGNOSIS — C7951 Secondary malignant neoplasm of bone: Secondary | ICD-10-CM

## 2020-12-10 DIAGNOSIS — Z170 Estrogen receptor positive status [ER+]: Secondary | ICD-10-CM | POA: Diagnosis not present

## 2020-12-10 DIAGNOSIS — C50411 Malignant neoplasm of upper-outer quadrant of right female breast: Secondary | ICD-10-CM

## 2020-12-10 DIAGNOSIS — Z9221 Personal history of antineoplastic chemotherapy: Secondary | ICD-10-CM | POA: Diagnosis not present

## 2020-12-10 DIAGNOSIS — Z9011 Acquired absence of right breast and nipple: Secondary | ICD-10-CM | POA: Diagnosis not present

## 2020-12-10 DIAGNOSIS — Z5112 Encounter for antineoplastic immunotherapy: Secondary | ICD-10-CM | POA: Diagnosis not present

## 2020-12-10 DIAGNOSIS — Z923 Personal history of irradiation: Secondary | ICD-10-CM | POA: Diagnosis not present

## 2020-12-10 DIAGNOSIS — Z79811 Long term (current) use of aromatase inhibitors: Secondary | ICD-10-CM | POA: Diagnosis not present

## 2020-12-10 DIAGNOSIS — C7931 Secondary malignant neoplasm of brain: Secondary | ICD-10-CM | POA: Diagnosis not present

## 2020-12-10 LAB — CMP (CANCER CENTER ONLY)
ALT: 23 U/L (ref 0–44)
AST: 63 U/L — ABNORMAL HIGH (ref 15–41)
Albumin: 3.5 g/dL (ref 3.5–5.0)
Alkaline Phosphatase: 209 U/L — ABNORMAL HIGH (ref 38–126)
Anion gap: 13 (ref 5–15)
BUN: 19 mg/dL (ref 8–23)
CO2: 23 mmol/L (ref 22–32)
Calcium: 13.5 mg/dL (ref 8.9–10.3)
Chloride: 100 mmol/L (ref 98–111)
Creatinine: 0.9 mg/dL (ref 0.44–1.00)
GFR, Estimated: >60 mL/min (ref >60)
Glucose, Bld: 140 mg/dL — ABNORMAL HIGH (ref 70–99)
Potassium: 4.1 mmol/L (ref 3.5–5.1)
Sodium: 136 mmol/L (ref 135–145)
Total Bilirubin: 0.7 mg/dL (ref 0.3–1.2)
Total Protein: 7.4 g/dL (ref 6.5–8.1)

## 2020-12-10 LAB — CBC WITH DIFFERENTIAL (CANCER CENTER ONLY)
Abs Immature Granulocytes: 0.02 10*3/uL (ref 0.00–0.07)
Basophils Absolute: 0 10*3/uL (ref 0.0–0.1)
Basophils Relative: 1 %
Eosinophils Absolute: 0 10*3/uL (ref 0.0–0.5)
Eosinophils Relative: 0 %
HCT: 35.4 % — ABNORMAL LOW (ref 36.0–46.0)
Hemoglobin: 11.7 g/dL — ABNORMAL LOW (ref 12.0–15.0)
Immature Granulocytes: 1 %
Lymphocytes Relative: 20 %
Lymphs Abs: 0.4 10*3/uL — ABNORMAL LOW (ref 0.7–4.0)
MCH: 30.7 pg (ref 26.0–34.0)
MCHC: 33.1 g/dL (ref 30.0–36.0)
MCV: 92.9 fL (ref 80.0–100.0)
Monocytes Absolute: 0.2 10*3/uL (ref 0.1–1.0)
Monocytes Relative: 8 %
Neutro Abs: 1.5 10*3/uL — ABNORMAL LOW (ref 1.7–7.7)
Neutrophils Relative %: 70 %
Platelet Count: 252 10*3/uL (ref 150–400)
RBC: 3.81 MIL/uL — ABNORMAL LOW (ref 3.87–5.11)
RDW: 14.7 % (ref 11.5–15.5)
WBC Count: 2.1 10*3/uL — ABNORMAL LOW (ref 4.0–10.5)
nRBC: 0 % (ref 0.0–0.2)

## 2020-12-10 MED ORDER — VINORELBINE TARTRATE CHEMO INJECTION 50 MG/5ML
20.0000 mg/m2 | Freq: Once | INTRAVENOUS | Status: AC
  Administered 2020-12-10: 30 mg via INTRAVENOUS
  Filled 2020-12-10: qty 3

## 2020-12-10 MED ORDER — PALONOSETRON HCL INJECTION 0.25 MG/5ML
INTRAVENOUS | Status: AC
  Filled 2020-12-10: qty 5

## 2020-12-10 MED ORDER — CYANOCOBALAMIN 1000 MCG/ML IJ SOLN
INTRAMUSCULAR | Status: AC
  Filled 2020-12-10: qty 1

## 2020-12-10 MED ORDER — ACETAMINOPHEN 325 MG PO TABS
650.0000 mg | ORAL_TABLET | Freq: Once | ORAL | Status: AC
  Administered 2020-12-10: 650 mg via ORAL

## 2020-12-10 MED ORDER — PALONOSETRON HCL INJECTION 0.25 MG/5ML
0.2500 mg | Freq: Once | INTRAVENOUS | Status: AC
  Administered 2020-12-10: 0.25 mg via INTRAVENOUS

## 2020-12-10 MED ORDER — SODIUM CHLORIDE 0.9% FLUSH
10.0000 mL | Freq: Once | INTRAVENOUS | Status: AC
  Administered 2020-12-10: 10 mL
  Filled 2020-12-10: qty 10

## 2020-12-10 MED ORDER — SODIUM CHLORIDE 0.9% FLUSH
10.0000 mL | INTRAVENOUS | Status: DC | PRN
  Administered 2020-12-10: 10 mL
  Filled 2020-12-10: qty 10

## 2020-12-10 MED ORDER — CYANOCOBALAMIN 1000 MCG/ML IJ SOLN
1000.0000 ug | INTRAMUSCULAR | Status: DC
  Administered 2020-12-10: 1000 ug via INTRAMUSCULAR

## 2020-12-10 MED ORDER — ZOLEDRONIC ACID 4 MG/100ML IV SOLN
4.0000 mg | Freq: Once | INTRAVENOUS | Status: AC
  Administered 2020-12-10: 4 mg via INTRAVENOUS

## 2020-12-10 MED ORDER — SODIUM CHLORIDE 0.9 % IV SOLN
INTRAVENOUS | Status: AC
  Filled 2020-12-10 (×2): qty 250

## 2020-12-10 MED ORDER — HEPARIN SOD (PORK) LOCK FLUSH 100 UNIT/ML IV SOLN
500.0000 [IU] | Freq: Once | INTRAVENOUS | Status: AC | PRN
  Administered 2020-12-10: 500 [IU]
  Filled 2020-12-10: qty 5

## 2020-12-10 MED ORDER — SODIUM CHLORIDE 0.9 % IV SOLN
Freq: Once | INTRAVENOUS | Status: AC
  Filled 2020-12-10: qty 250

## 2020-12-10 MED ORDER — ACETAMINOPHEN 325 MG PO TABS
ORAL_TABLET | ORAL | Status: AC
  Filled 2020-12-10: qty 2

## 2020-12-10 MED ORDER — SODIUM CHLORIDE 0.9 % IV SOLN
10.0000 mg | Freq: Once | INTRAVENOUS | Status: AC
  Administered 2020-12-10: 10 mg via INTRAVENOUS
  Filled 2020-12-10: qty 10

## 2020-12-10 MED ORDER — HYDROCODONE-ACETAMINOPHEN 5-325 MG PO TABS: ORAL_TABLET | ORAL | 0 refills | Status: AC

## 2020-12-10 MED ORDER — SODIUM CHLORIDE 0.9 % IV SOLN
15.0000 mg/kg | Freq: Once | INTRAVENOUS | Status: AC
  Administered 2020-12-10: 750 mg via INTRAVENOUS
  Filled 2020-12-10: qty 30

## 2020-12-10 MED ORDER — ZOLEDRONIC ACID 4 MG/100ML IV SOLN
INTRAVENOUS | Status: AC
  Filled 2020-12-10: qty 100

## 2020-12-10 NOTE — Progress Notes (Signed)
Palliative Care Consultation Note [facility]patient [facility]  Mrs. Narine is a 75 yo who has had Stage IV  Breast Cancer since 2013. She was referred for palliative care services with worsening pain related to skeletal metastasis, hypercalcemia of malignancy and progressive disease despite multiple lines of treatment. She has also recently experienced significant functional status decline and weakness related to wide spread skeletal mets and pathologic fracture involving the sternum, scapula and lumbar and thoracic spine.   She lives with her husband in [place], he is her primary caregiver.She has a son who is also supportive and a DIL who is a Marine scientist and also has experience with caring for her mother who died of cancer. Mrs. Ditullio is open to conversation about the reality of her condition and is engaged around planning. She reports her husband and son are having the hardest time with her cancer progression. Her family has also been urging her to go to [facility] for more treatment options. Mrs. [place] endorses being very tired, her QOL is declining and she is thinking about at what point she may want to stop treatment. She trusts her oncology team and would like to have further conversation about her prognosis if she were to stop treatment. I introduced the concept of palliative care and transitioning into hospice care at the time that is right for her and based on her condition. She had Advanced Care Planning documents and would not want aggressive or painful interventions in the event of further decline.  Pain has been a worsening issues for Mrs. Filler- given the extensive nature of her disease-and dramatic path fractures which notoriously cause excruciating pain she has some how been getting by on HALF of a 5mg /325mg  Hydrocodone Tablet only a couple of times a day-if that according to her-she says it helps her pain when she takes a half and she feels like anything much more than that will "knock her out". She reports  high sensitivity to opiod pain medication- she is fearful of taking too much and tells me she isn't a "druggie" and in general doesn't like pills. I provided reassurance that the pills taken as prescribed would not in any circumstances make her addicted nor were we concerned but that we were were most concerned that she get relief. She also expressed concerns about her mobility when taking opioid pain medicine. She is unsteady and requires assistance now -especially with getting to the bathroom and with taking a shower etc. It has been really difficult for her to feel dependent on others and is worried about her QOL.  Recommendations:  Pain: This is a challenging issues for her because she is extremely opioid sensitive-she has only been taking a half of a vicodin which is about the lowest possible dose of opioid we can prescribe-she says it helps with her pain when she takes it but it makes her very tired and sleepy. She reports sleeping more and worsening fatigue. She was hesitant to increase her dose or change opioids-she has had a bad reaction to oxycodone in the past and is mostly opioid naive which is really unusual given the extent of her skeletal disease. I strongly encouraged her to take the full dose and she could take it every 4 hours. I also told her based on her tolerability she could take significantly more medication-but without knowing her total requirement it is difficult to even consider a long acting medication.I have asked her to take the medicine when she is hurting and to not wait-and asked  her to take a whole tablet if she is able. I have also asked her to keep track of how much she is taking and her response to the medicine at least for a few days. Hopefully this will help [place] in figuring out what she is able to tolerate. Continue taking her gabapentin twice daily and she needs daily decadron for inflammatory pain.  Hydrocodone 5/325 q4 prn pain  Gabapentin 600mg  BID Decadron 4mg   daily Zometa or Xgeva q2-3 weeks  2. Ongoing Palliative Care- I have placed a referral for [facility] to see her for Palliative care and ongoing symptom management- her meds will likely need to be adjusted frequently- she would also benefit from in home care services-not sure what would be covered under her medicare plan- consider [facility] referral for in home nursing care.  3. ACP documents are on file.  4. Hypercalcemia- Added Intranasal Calcitonin for hypercalcemia and to also help with pain associated with acute compression fractures. Hypercalcemia is a poor prognostic indicator-consideration for hospice services in the near future would be very reasonable. Will discuss w/ [name].  Lane Hacker, DO Palliative Medicine  Time: 60 min Greater than 50%  of this time was spent counseling and coordinating care related to the above assessment and plan.

## 2020-12-10 NOTE — Progress Notes (Signed)
[facility] called to notify us that pt would need Zometa 4g for HCM (CCa 13.9).

## 2020-12-10 NOTE — Patient Instructions (Signed)
[name]  Discharge Instructions: Thank you for choosing [facility] to provide your oncology and hematology care.   If you have a lab appointment with the [facility], please go directly to the [facility] and check in at the registration area.   Wear comfortable clothing and clothing appropriate for easy access to any Portacath or PICC line.   We strive to give you quality time with your provider. You may need to reschedule your appointment if you arrive late (15 or more minutes).  Arriving late affects you and other patients whose appointments are after yours.  Also, if you miss three or more appointments without notifying the office, you may be dismissed from the clinic at the provider's discretion.      For prescription refill requests, have your pharmacy contact our office and allow 72 hours for refills to be completed.    Today you received the following chemotherapy and/or immunotherapy agents : Margenza, Navelbine     To help prevent nausea and vomiting after your treatment, we encourage you to take your nausea medication as directed.  BELOW ARE SYMPTOMS THAT SHOULD BE REPORTED IMMEDIATELY: *FEVER GREATER THAN 100.4 F (38 C) OR HIGHER *CHILLS OR SWEATING *NAUSEA AND VOMITING THAT IS NOT CONTROLLED WITH YOUR NAUSEA MEDICATION *UNUSUAL SHORTNESS OF BREATH *UNUSUAL BRUISING OR BLEEDING *URINARY PROBLEMS (pain or burning when urinating, or frequent urination) *BOWEL PROBLEMS (unusual diarrhea, constipation, pain near the anus) TENDERNESS IN MOUTH AND THROAT WITH OR WITHOUT PRESENCE OF ULCERS (sore throat, sores in mouth, or a toothache) UNUSUAL RASH, SWELLING OR PAIN  UNUSUAL VAGINAL DISCHARGE OR ITCHING   Items with * indicate a potential emergency and should be followed up as soon as possible or go to the Emergency Department if any problems should occur.  Please show the CHEMOTHERAPY ALERT CARD or IMMUNOTHERAPY ALERT CARD at  check-in to the Emergency Department and triage nurse.  Should you have questions after your visit or need to cancel or reschedule your appointment, please contact [facility]  Dept: [phone]  and follow the prompts.  Office hours are 8:00 a.m. to 4:30 p.m. Monday - Friday. Please note that voicemails left after 4:00 p.m. may not be returned until the following business day.  We are closed weekends and major holidays. You have access to a nurse at all times for urgent questions. Please call the main number to the clinic Dept: [phone] and follow the prompts.   For any non-urgent questions, you may also contact your provider using MyChart. We now offer e-Visits for anyone 64 and older to request care online for non-urgent symptoms. For details visit [email or web address].   Also download the MyChart app! Go to the app store, search "MyChart", open the app, select [facility], and log in with your MyChart username and password.  Due to Covid, a mask is required upon entering the hospital/clinic. If you do not have a mask, one will be given to you upon arrival. For doctor visits, patients may have 1 support person aged 36 or older with them. For treatment visits, patients cannot have anyone with them due to current Covid guidelines and our immunocompromised population.

## 2020-12-11 ENCOUNTER — Ambulatory Visit: Payer: Medicare Other | Admitting: Radiation Oncology

## 2020-12-11 ENCOUNTER — Telehealth: Payer: Self-pay | Admitting: Hematology and Oncology

## 2020-12-11 NOTE — Telephone Encounter (Signed)
Scheduled per 8/4 los. Called and spoke with pt confirmed 8/11 appt

## 2020-12-14 ENCOUNTER — Ambulatory Visit: Payer: Medicare Other | Admitting: Radiation Oncology

## 2020-12-14 ENCOUNTER — Ambulatory Visit
Admission: RE | Admit: 2020-12-14 | Discharge: 2020-12-14 | Disposition: A | Discharge status: Home / Self Care | Payer: Medicare Other | Source: Ambulatory Visit | Attending: Radiation Oncology | Admitting: Radiation Oncology

## 2020-12-14 DIAGNOSIS — C7931 Secondary malignant neoplasm of brain: Secondary | ICD-10-CM | POA: Diagnosis not present

## 2020-12-14 DIAGNOSIS — Z510 Encounter for antineoplastic radiation therapy: Secondary | ICD-10-CM | POA: Diagnosis not present

## 2020-12-14 DIAGNOSIS — C50411 Malignant neoplasm of upper-outer quadrant of right female breast: Secondary | ICD-10-CM | POA: Diagnosis not present

## 2020-12-15 ENCOUNTER — Ambulatory Visit: Payer: Medicare Other | Admitting: Radiation Oncology

## 2020-12-16 ENCOUNTER — Other Ambulatory Visit: Payer: Self-pay

## 2020-12-16 ENCOUNTER — Ambulatory Visit
Admission: RE | Admit: 2020-12-16 | Discharge: 2020-12-16 | Disposition: A | Discharge status: Home / Self Care | Payer: Medicare Other | Source: Ambulatory Visit | Attending: Radiation Oncology | Admitting: Radiation Oncology

## 2020-12-16 ENCOUNTER — Encounter: Payer: Self-pay | Admitting: Urology

## 2020-12-16 DIAGNOSIS — C7931 Secondary malignant neoplasm of brain: Secondary | ICD-10-CM | POA: Diagnosis not present

## 2020-12-16 DIAGNOSIS — C50411 Malignant neoplasm of upper-outer quadrant of right female breast: Secondary | ICD-10-CM | POA: Diagnosis not present

## 2020-12-16 DIAGNOSIS — Z510 Encounter for antineoplastic radiation therapy: Secondary | ICD-10-CM | POA: Diagnosis not present

## 2020-12-16 DIAGNOSIS — Z853 Personal history of malignant neoplasm of breast: Secondary | ICD-10-CM | POA: Diagnosis not present

## 2020-12-16 DIAGNOSIS — C7951 Secondary malignant neoplasm of bone: Secondary | ICD-10-CM

## 2020-12-16 NOTE — Progress Notes (Signed)
Patient Care Team: [name], MD as PCP - General (Family Medicine) [name], MD as Consulting Physician (Hematology and Oncology) Bensimhon, Shaune Pascal, MD as Consulting Physician (Cardiology) Marletta Lor as Consulting Physician (Internal Medicine) Tyler Pita, MD as Consulting Physician (Radiation Oncology)  DIAGNOSIS:    ICD-10-CM   1. Brain metastasis ([place])  C79.31     2. Bone metastasis ([place])  C79.51     3. Primary cancer of upper outer quadrant of right female breast ([place])  C50.411       SUMMARY OF ONCOLOGIC HISTORY: Oncology History  Primary cancer of upper outer quadrant of right female breast ([place])  03/08/2012 Initial Diagnosis   Right breast invasive ductal carcinoma ER positive PR negative HER-2 positive Ki-67 44%; another breast mass biopsied in the anterior part of the breast which was also positive for malignancy that was HER-2 negative   03/14/2012 Cancer Staging   Staging form: Breast, AJCC 7th Edition - Pathologic: Stage IV (M1) - Signed by [name], NP on 06/22/2018   05/10/2012 Surgery   Right mastectomy and axillary lymph node dissection: Multifocal disease 5 cm, 1.7 cm, 1.6 cm, ER positive PR negative HER-2 positive Ki-67 44%, 3/7 lymph nodes positive   06/14/2012 - 06/12/2013 Chemotherapy   Adjuvant chemotherapy with [place] 6 followed by Herceptin maintenance   10/25/2012 - 12/11/2012 Radiation Therapy   Adjuvant radiation therapy   02/07/2013 -  Anti-estrogen oral therapy   Letrozole 2.5 mg daily   02/14/2015 Imaging   MRI spine: Large destructive T5 lesion with severe pathologic compression fracture and extensive epidural tumor, severe spinal stenosis with moderate cord compression, tumor involvement of T4   03/18/2015 PET scan   Residual enhancing soft tissue adjacent to the spinal cord. No evidence of metastatic disease. Nonspecific uptake the left nipple   03/27/2015 - 03/30/2015 Hospital Admission   T4-6  decompression for spinal cord compression (lower extremity paralysis)   04/10/2015 -  Chemotherapy   Palliative treatment with Herceptin every 3 weeks with letrozole 2.5 mg daily   04/13/2015 - 05/19/2015 Radiation Therapy   Palliative radiation treatment to the spine   09/01/2015 Imaging   CT chest abdomen pelvis: Pathologic fracture with posterior fusion at T5 no other evidence of metastatic disease in the chest abdomen pelvis   12/23/2015 Imaging   CT CAP: new nonspecific 0.6 cm lymph node was stated mediastinum needs follow-up CT, innumerable tiny groundglass pulmonary nodules throughout both lungs unchanged, patchy consolidation from radiation, T5 fracture, no mets   04/15/2016 PET scan   Rt para-spinal mass mildy hypermetabolic SUV 5.9, lytic cortical lesion 4.8 cm lesion left prox femur suv 8.8 favor osseus met disease   04/28/2016 - 05/12/2016 Radiation Therapy   Palliative XRT Left femur   05/24/2016 Procedure   Soft tissue mass biopsy back: Metastatic breast cancer ER 80%, PR 0%, Ki-67 50%, HER-2 positive ratio 2.25   06/09/2016 - 03/29/2017 Anti-estrogen oral therapy   Faslodex with Herceptin and Perjeta every 4 weeks   06/13/2016 - 06/15/2016 Hospital Admission   uncomplicated revision of previous thoracic instrumentation.   10/10/2016 PET scan   Interval development of new hypermetabolic 2.5 x 1.1 cm lesion posterior right eighth rib consistent with metastatic disease, stable right paraspinal lesion at T8, clustered soft tissue nodules in the posterior midline back near cervicothoracic junction smaller than before    11/03/2016 - 11/07/2016 Radiation Therapy   Palliative radiation to right eighth rib   11/04/2016 Imaging   Sol[facility]  contrast-enhancing lesion in the right cerebellum 6 x 2 mm concerning for metastatic lesion   11/24/2016 - 11/24/2016 Radiation Therapy   SRS to the brain lesion   04/18/2017 PET scan   New ill-defined rounded airspace opacity in posterior right  lower lobe. Differential diagnosis includes malignancy and infectious/inflammatory process.Increased hypermetabolic lytic lesion in T8 vertebral body, consistent with bone metastasis.  Increased hypermetabolic posterior upper chest wall subcutaneous nodules, highly suspicious metastatic disease. Further improvement in right posterior eighth rib metastasis.   05/04/2017 - 07/27/2017 Chemotherapy   Kadcyla every 3 weeks palliative chemotherapy stopped for progression   07/26/2017 Imaging   Interval increase in size of the multiple enhancing masses posteriorly overlying the spine, interval development of left axillary and mediastinal adenopathy, increase in the size of lytic lesions in T8-T9 and left ninth rib, increased groundglass opacities right lower lobe lung   07/27/2017 - 05/11/2018 Chemotherapy   Palbociclib + Faslodex + trastuzumab + Pertuzumab     04/03/2018 PET scan   PET CT scan showed progression of disease not only in the bones but also in the subcutaneous nodules.   05/15/2018 - 05/25/2018 Radiation Therapy   Palliative radiation to the back   06/01/2018 - 12/27/2019 Chemotherapy   Enhertu (Transtuzumab Deruxtecan)    08/07/2018 - 08/07/2018 Radiation Therapy   T12 vertebral body SRS   01/2020 - 10/23/2020 Chemotherapy    Xeloda, Tucatinib, Herceptin    11/19/2020 -  Chemotherapy    Patient is on Treatment Plan: BREAST MARGETUXIMAB + VINORELBINE Q21D       Carcinoma of right breast, stage 4 (HCC)  02/14/2015 Initial Diagnosis   Breast cancer metastasized to bone, right ([place])   06/01/2018 - 02/07/2020 Chemotherapy   The patient had palonosetron (ALOXI) injection 0.25 mg, 0.25 mg, Intravenous,  Once, 24 of 30 cycles Administration: 0.25 mg (06/01/2018), 0.25 mg (06/22/2018), 0.25 mg (08/24/2018), 0.25 mg (07/13/2018), 0.25 mg (08/03/2018), 0.25 mg (09/14/2018), 0.25 mg (10/05/2018), 0.25 mg (11/16/2018), 0.25 mg (10/26/2018), 0.25 mg (12/07/2018), 0.25 mg (12/28/2018), 0.25 mg (01/18/2019),  0.25 mg (03/15/2019), 0.25 mg (02/15/2019), 0.25 mg (04/12/2019), 0.25 mg (05/09/2019), 0.25 mg (06/07/2019), 0.25 mg (07/05/2019), 0.25 mg (08/09/2019), 0.25 mg (09/13/2019), 0.25 mg (10/18/2019), 0.25 mg (11/22/2019), 0.25 mg (12/27/2019) fam-trastuzumab deruxtecan-nxki (ENHERTU) 332 mg in dextrose 5 % 100 mL chemo infusion, 5.4 mg/kg = 332 mg, Intravenous,  Once, 24 of 30 cycles Dose modification: 4.4 mg/kg (original dose 5.4 mg/kg, Cycle 7, Reason: Provider Judgment), 3.2 mg/kg (original dose 5.4 mg/kg, Cycle 9, Reason: Dose not tolerated), 150 mg (original dose 5.4 mg/kg, Cycle 18, Reason: Dose not tolerated) Administration: 332 mg (06/01/2018), 332 mg (06/22/2018), 332 mg (07/13/2018), 332 mg (08/24/2018), 332 mg (08/03/2018), 332 mg (09/14/2018), 272 mg (10/05/2018), 200 mg (11/16/2018), 272 mg (10/26/2018), 200 mg (12/07/2018), 200 mg (12/28/2018), 200 mg (01/18/2019), 200 mg (03/15/2019), 200 mg (02/15/2019), 200 mg (04/12/2019), 200 mg (05/09/2019), 200 mg (06/07/2019), 150 mg (07/05/2019), 150 mg (08/09/2019), 150 mg (09/13/2019), 150 mg (10/18/2019), 150 mg (11/22/2019), 150 mg (12/27/2019)   for chemotherapy treatment.     05/29/2020 - 11/13/2020 Chemotherapy    Patient is on Treatment Plan: BREAST MARGETUXIMAB + VINORELBINE Q21D       Metastatic cancer to spine ([place])  Brain metastasis ([place])  11/18/2016 Initial Diagnosis   Brain metastasis ([place])   05/29/2020 - 11/13/2020 Chemotherapy    Patient is on Treatment Plan: BREAST MARGETUXIMAB + VINORELBINE Q21D         CHIEF COMPLIANT: Day 8, Cycle 2 margetuximab  and vinorelbine  INTERVAL HISTORY: Heather [name] is a 75 y.o. with above-mentioned history of metastatic breast cancer HER2 positive disease who is here for treatment with margetuximab and vinorelbine. She presents to the clinic today for treatment.  Today she is feeling significantly better.  It appears that when the hypercalcemia gets worse she feels worked worse.  She is still weak and frail.  She complains of her  left chest wall pain that does not appear to get better with pain medications.  She is trying to get set up with palliative care.  ALLERGIES:  is allergic to other, acyclovir and related, brompheniramine, chlorhexidine, dextromethorphan, monascus purpureus went yeast, phenylephrine hcl, pravastatin sodium, tegaderm ag mesh [silver], zanaflex [tizanidine hcl], codeine, keflex [cephalexin], naprosyn [naproxen], oxycodone, tramadol, and tussin [guaifenesin].  MEDICATIONS:  Current Outpatient Medications  Medication Sig Dispense Refill   acetaminophen (TYLENOL) 500 MG tablet Take 500 mg by mouth every 6 (six) hours as needed.     BOOSTRIX 5-2.5-18.5 LF-MCG/0.5 injection      calcitonin, salmon, (MIACALCIN) 200 UNIT/ACT nasal spray Place 1 spray into alternate nostrils daily. 3.7 mL 12   COLLAGEN PO Take by mouth.     Cranberry 250 MG CAPS Take by mouth.     dexamethasone (DECADRON) 4 MG tablet Take 1 tablet (4 mg total) by mouth daily. Take one tablet (4 mg) daily (every day in the morning). On day after chemotherapy day take two tablets in morning (8m), then resume regular daily dose. 30 tablet 1   famotidine (PEPCID) 40 MG tablet Take 40 mg by mouth daily.     gabapentin (NEURONTIN) 600 MG tablet Take 600 mg by mouth 2 (two) times daily.     HYDROcodone-acetaminophen (NORCO/VICODIN) 5-325 MG tablet You may take 1/2 to 1 tablet every four hours as needed for pain. 120 tablet 0   lidocaine-prilocaine (EMLA) cream Apply to affected area once 30 g 3   ondansetron (ZOFRAN) 8 MG tablet Take 1 tablet (8 mg total) by mouth 2 (two) times daily as needed. Start on the third day after margetuximab chemo. 30 tablet 1   OVER THE COUNTER MEDICATION Place 1 drop into both eyes 2 (two) times daily as needed (dry eyes). Over the counter lubricating eye drops     polyethylene glycol (MIRALAX / GLYCOLAX) packet Take 17 g by mouth daily as needed.     prochlorperazine (COMPAZINE) 10 MG tablet Take 1 tablet (10 mg  total) by mouth every 6 (six) hours as needed (Nausea or vomiting). 30 tablet 1   spironolactone (ALDACTONE) 25 MG tablet Take 25 mg by mouth daily.     tamsulosin (FLOMAX) 0.4 MG CAPS capsule Take 1 capsule (0.4 mg total) by mouth daily. 30 capsule 3   No current facility-administered medications for this visit.   Facility-Administered Medications Ordered in Other Visits  Medication Dose Route Frequency Provider Last Rate Last Admin   cyanocobalamin ((VITAMIN B-12)) injection 1,000 mcg  1,000 mcg Intramuscular Q6 weeks GNicholas Lose MD   1,000 mcg at 03/19/20 1110   heparin lock flush 100 unit/mL  500 Units Intracatheter Once GNicholas Lose MD       sodium chloride flush (NS) 0.9 % injection 10 mL  10 mL Intracatheter Once GNicholas Lose MD        PHYSICAL EXAMINATION: ECOG PERFORMANCE STATUS: 3 - Symptomatic, >50% confined to bed  Vitals:   12/17/20 1424  BP: (!) 132/53  Pulse: 68  Resp: 18  Temp: 98.8 F (37.1  C)  SpO2: 100%   Filed Weights   12/17/20 1424  Weight: 112 lb 11.2 oz (51.1 kg)    LABORATORY DATA:  I have reviewed the data as listed CMP Latest Ref Rng & Units 12/10/2020 11/27/2020 11/19/2020  Glucose 70 - 99 mg/dL 140(H) 124(H) 119(H)  BUN 8 - 23 mg/dL _0 Creatinine 0.44 - 1.00 mg/dL 0.90 0.76 0.83  Sodium 135 - 145 mmol/L 136 138 137  Potassium 3.5 - 5.1 mmol/L 4.1 3.5 4.0  Chloride 98 - 111 mmol/L 100 98 101  CO2 22 - 32 mmol/L _1 Calcium 8.9 - 10.3 mg/dL 13.5(HH) 12.8(H) 12.4(H)  Total Protein 6.5 - 8.1 g/dL 7.4 7.4 7.7  Total Bilirubin 0.3 - 1.2 mg/dL 0.7 0.7 0.7  Alkaline Phos 38 - 126 U/L 209(H) 185(H) 236(H)  AST 15 - 41 U/L 63(H) 52(H) 97(H)  ALT 0 - 44 U/L 23 32 34    Lab Results  Component Value Date   WBC 2.1 (L) 12/10/2020   HGB 11.7 (L) 12/10/2020   HCT 35.4 (L) 12/10/2020   MCV 92.9 12/10/2020   PLT 252 12/10/2020   NEUTROABS 1.5 (L) 12/10/2020    ASSESSMENT & PLAN:  Primary cancer of upper outer quadrant of right  female breast ([place]) Right breast invasive ductal carcinoma ER positive PR negative HER-2 positive Ki-67 44% multifocal disease 3/7 lymph nodes positive T2 N1 M0 stage IIB status post adjuvant chemotherapy with TCH followed by Herceptin maintenance, adjuvant radiation therapy and Letrozole. MRI 02/14/2015: T5 large destructive lesion with pathologic compression fracture and extensive epidural tumor, involvement of T4, excision metastatic carcinoma ER 60%, PR 0%, HER-2 positive ratio 2.71 average copy #7.45 03/27/2015: T4-6 decompression surgery for spinal cord compression PET-CT 04/17/16 Rt para-spinal mass mildy hypermetabolic SUV 5.9, lytic cortical lesion 4.8 cm lesion left prox femur suv 8.8 favor osseus met disease Echo 04/12/2017 shows well preserved ejection fraction of 55-60%    Treatment summary: 1. Resumed anastrozole 05/21/2015, but it has caused significant hair loss so we switched her to exemestane 25 mg once daily 07/23/2015 2. Herceptin every 3 week started 04/10/2015 3. Bone metastases: Zometa every 3 months 4. Radiation therapy to the spine (Dr. [name]) 04/13/2015 to 05/19/2015 5. Radiation therapy to left femur 04/28/2016 to 05/12/2016 6. SRS to the brain lesion 11/24/2016 7.  Kadcyla started 05/05/2017 discontinued 07/27/2017 8.Palbociclib + Faslodex + trastuzumab + Pertuzumab started 08/10/2017, stopped 04/04/2018 9. XRT to T12 07/19/2018 10. Enhertu started 06/01/18-12/27/2019 stopped for progression 11 SRS to brain January 2021 12.  Lapatinib with anastrozole stopped for progression 13.  Xeloda, Tucatinib, Herceptin 14. Margetuximab with Navelbine 11/19/2020 -------------------------------------------------------------------------------------------------------------------------- Current treatment: Margetuximab with Navelbine cycle 2 day 8 Chemo toxicities: 1.  Profound fatigue and decreased appetite and weight loss It appears that when the hypercalcemia gets worse she gets  more fatigued. 2. chest wall pain: Palliative care will be following her.  Hypercalcemia: Zometa every 3 weeks   Scans are scheduled for 01/12/2021 Palliative care with Dr. Hilma Favors.  Return to clinic in 2 weeks for cycle 3    No orders of the defined types were placed in this encounter.  The patient has a good understanding of the overall plan. she agrees with it. she will call with any problems that may develop before the next visit here.  Total time spent: 30 mins including face to face time and time spent for planning, charting and coordination of care  Rulon Eisenmenger, MD, MPH  12/17/2020  I, Thana Ates, am acting as scribe for Dr. [name].  I have reviewed the above documentation for accuracy and completeness, and I agree with the above.

## 2020-12-17 ENCOUNTER — Inpatient Hospital Stay (HOSPITAL_BASED_OUTPATIENT_CLINIC_OR_DEPARTMENT_OTHER): Payer: Medicare Other | Admitting: Hematology and Oncology

## 2020-12-17 VITALS — BP 132/53 | HR 68 | Temp 98.8°F | Resp 18 | Ht 62.0 in | Wt 112.7 lb

## 2020-12-17 DIAGNOSIS — Z5112 Encounter for antineoplastic immunotherapy: Secondary | ICD-10-CM | POA: Diagnosis not present

## 2020-12-17 DIAGNOSIS — C7951 Secondary malignant neoplasm of bone: Secondary | ICD-10-CM

## 2020-12-17 DIAGNOSIS — Z170 Estrogen receptor positive status [ER+]: Secondary | ICD-10-CM | POA: Diagnosis not present

## 2020-12-17 DIAGNOSIS — C50411 Malignant neoplasm of upper-outer quadrant of right female breast: Secondary | ICD-10-CM

## 2020-12-17 DIAGNOSIS — C7931 Secondary malignant neoplasm of brain: Secondary | ICD-10-CM

## 2020-12-17 NOTE — Assessment & Plan Note (Signed)
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 MRI 02/14/2015: T5 large destructive lesion with pathologic compression fracture and extensive epidural tumor, involvement of T4, excision metastatic carcinoma ER 60%, PR 0%, HER-2 positive ratio 2.71 average copy #7.45 03/27/2015: T4-6 decompression surgery for spinal cord compression PET-CT 12/10/17Rt para-spinal mass mildy hypermetabolic SUV 5.9, lytic cortical lesion 4.8 cm lesion left prox femur suv 8.8 favor osseus met disease Echo 04/12/2017 shows well preserved ejection fraction of 55-60%  Treatment summary: 1. Resumed anastrozole 05/21/2015, but it has caused significant hair loss so we switched her to exemestane 25 mg once daily 07/23/2015 2. Herceptin every 3 week started 04/10/2015 3. Bone metastases: Zometa every 3 months 4. Radiation therapy to the spine (Dr. [name]) 04/13/2015 to 05/19/2015 5. Radiation therapy to left femur 04/28/2016 to 05/12/2016 6. SRS to the brain lesion 11/24/2016 7.Kadcyla started 05/05/2017 discontinued 07/27/2017 8.Palbociclib +Faslodex +trastuzumab+ Pertuzumabstarted 08/10/2017, stopped 04/04/2018 9.XRT to T123/04/2019 10. Enhertu started 06/01/18-12/27/2019 stopped for progression 11SRS to brain January 2021 12.Lapatinib with anastrozolestopped for progression 13.Xeloda, Tucatinib, Herceptin 14. Margetuximab with Navelbine 11/19/2020 -------------------------------------------------------------------------------------------------------------------------- Current treatment: Margetuximab with Navelbine cycle 2 day 8 Chemo toxicities: 1.  Profound fatigue and decreased appetite and weight loss  Hypercalcemia: Zometa   Scans are scheduled for 01/12/2021 Palliative care with  Dr. Hilma Favors.  Return to clinic in 2 weeks for cycle 3

## 2020-12-18 ENCOUNTER — Other Ambulatory Visit: Payer: Self-pay

## 2020-12-18 ENCOUNTER — Inpatient Hospital Stay: Payer: Medicare Other

## 2020-12-18 VITALS — BP 146/66 | HR 63 | Temp 97.8°F | Resp 20

## 2020-12-18 DIAGNOSIS — C50411 Malignant neoplasm of upper-outer quadrant of right female breast: Secondary | ICD-10-CM

## 2020-12-18 DIAGNOSIS — Z5112 Encounter for antineoplastic immunotherapy: Secondary | ICD-10-CM | POA: Diagnosis not present

## 2020-12-18 DIAGNOSIS — C7951 Secondary malignant neoplasm of bone: Secondary | ICD-10-CM

## 2020-12-18 DIAGNOSIS — C7931 Secondary malignant neoplasm of brain: Secondary | ICD-10-CM | POA: Diagnosis not present

## 2020-12-18 DIAGNOSIS — C50911 Malignant neoplasm of unspecified site of right female breast: Secondary | ICD-10-CM

## 2020-12-18 DIAGNOSIS — Z170 Estrogen receptor positive status [ER+]: Secondary | ICD-10-CM | POA: Diagnosis not present

## 2020-12-18 LAB — CMP (CANCER CENTER ONLY)
ALT: 52 U/L — ABNORMAL HIGH (ref 0–44)
AST: 65 U/L — ABNORMAL HIGH (ref 15–41)
Albumin: 3.5 g/dL (ref 3.5–5.0)
Alkaline Phosphatase: 233 U/L — ABNORMAL HIGH (ref 38–126)
Anion gap: 11 (ref 5–15)
BUN: 35 mg/dL — ABNORMAL HIGH (ref 8–23)
CO2: 25 mmol/L (ref 22–32)
Calcium: 10.1 mg/dL (ref 8.9–10.3)
Chloride: 101 mmol/L (ref 98–111)
Creatinine: 0.9 mg/dL (ref 0.44–1.00)
GFR, Estimated: >60 mL/min (ref >60)
Glucose, Bld: 178 mg/dL — ABNORMAL HIGH (ref 70–99)
Potassium: 3.8 mmol/L (ref 3.5–5.1)
Sodium: 137 mmol/L (ref 135–145)
Total Bilirubin: 0.5 mg/dL (ref 0.3–1.2)
Total Protein: 6.9 g/dL (ref 6.5–8.1)

## 2020-12-18 LAB — CBC WITH DIFFERENTIAL (CANCER CENTER ONLY)
Abs Immature Granulocytes: 0.08 10*3/uL — ABNORMAL HIGH (ref 0.00–0.07)
Basophils Absolute: 0 10*3/uL (ref 0.0–0.1)
Basophils Relative: 0 %
Eosinophils Absolute: 0 10*3/uL (ref 0.0–0.5)
Eosinophils Relative: 0 %
HCT: 33.2 % — ABNORMAL LOW (ref 36.0–46.0)
Hemoglobin: 11.1 g/dL — ABNORMAL LOW (ref 12.0–15.0)
Immature Granulocytes: 1 %
Lymphocytes Relative: 6 %
Lymphs Abs: 0.4 10*3/uL — ABNORMAL LOW (ref 0.7–4.0)
MCH: 30.7 pg (ref 26.0–34.0)
MCHC: 33.4 g/dL (ref 30.0–36.0)
MCV: 92 fL (ref 80.0–100.0)
Monocytes Absolute: 0.2 10*3/uL (ref 0.1–1.0)
Monocytes Relative: 3 %
Neutro Abs: 6.1 10*3/uL (ref 1.7–7.7)
Neutrophils Relative %: 90 %
Platelet Count: 231 10*3/uL (ref 150–400)
RBC: 3.61 MIL/uL — ABNORMAL LOW (ref 3.87–5.11)
RDW: 15 % (ref 11.5–15.5)
WBC Count: 6.8 10*3/uL (ref 4.0–10.5)
nRBC: 0 % (ref 0.0–0.2)

## 2020-12-18 MED ORDER — VINORELBINE TARTRATE CHEMO INJECTION 50 MG/5ML
20.0000 mg/m2 | Freq: Once | INTRAVENOUS | Status: AC
  Administered 2020-12-18: 30 mg via INTRAVENOUS
  Filled 2020-12-18: qty 3

## 2020-12-18 MED ORDER — ACETAMINOPHEN 325 MG PO TABS
650.0000 mg | ORAL_TABLET | Freq: Once | ORAL | Status: AC
  Administered 2020-12-18: 650 mg via ORAL
  Filled 2020-12-18: qty 2

## 2020-12-18 MED ORDER — PROCHLORPERAZINE MALEATE 10 MG PO TABS
5.0000 mg | ORAL_TABLET | Freq: Once | ORAL | Status: AC
  Administered 2020-12-18: 5 mg via ORAL
  Filled 2020-12-18: qty 1

## 2020-12-18 MED ORDER — SODIUM CHLORIDE 0.9% FLUSH
10.0000 mL | Freq: Once | INTRAVENOUS | Status: AC
  Administered 2020-12-18: 10 mL
  Filled 2020-12-18: qty 10

## 2020-12-18 MED ORDER — SODIUM CHLORIDE 0.9% FLUSH
10.0000 mL | INTRAVENOUS | Status: DC | PRN
  Administered 2020-12-18: 10 mL
  Filled 2020-12-18: qty 10

## 2020-12-18 MED ORDER — HEPARIN SOD (PORK) LOCK FLUSH 100 UNIT/ML IV SOLN
500.0000 [IU] | Freq: Once | INTRAVENOUS | Status: AC | PRN
  Administered 2020-12-18: 500 [IU]
  Filled 2020-12-18: qty 5

## 2020-12-18 MED ORDER — SODIUM CHLORIDE 0.9 % IV SOLN
Freq: Once | INTRAVENOUS | Status: AC
  Filled 2020-12-18: qty 250

## 2020-12-18 NOTE — Progress Notes (Signed)
Pt c/o new edema in right upper arm.  Pt c/o 4/10 pain.   Pt thinks swelling is related to lymphedema.  No redness/heat to area.  MD not present.  Katheren Puller, RN aware and will update MD on Monday.  Tylenol given for pain.  Pt ok with plan, pt understands to present to ED if swelling increases/redness/warm occur.

## 2020-12-18 NOTE — Patient Instructions (Signed)
[facility] ONCOL[facility]  Discharge Instructions: Thank you for choosing [facility] to provide your oncology and hematology care.   If you have a lab appointment with the [facility], please go directly to the [facility] and check in at the registration area.   Wear comfortable clothing and clothing appropriate for easy access to any Portacath or PICC line.   We strive to give you quality time with your provider. You may need to reschedule your appointment if you arrive late (15 or more minutes).  Arriving late affects you and other patients whose appointments are after yours.  Also, if you miss three or more appointments without notifying the office, you may be dismissed from the clinic at the provider's discretion.      For prescription refill requests, have your pharmacy contact our office and allow 72 hours for refills to be completed.    Today you received the following chemotherapy and/or immunotherapy agents :Navelbine     To help prevent nausea and vomiting after your treatment, we encourage you to take your nausea medication as directed.  BELOW ARE SYMPTOMS THAT SHOULD BE REPORTED IMMEDIATELY: *FEVER GREATER THAN 100.4 F (38 C) OR HIGHER *CHILLS OR SWEATING *NAUSEA AND VOMITING THAT IS NOT CONTROLLED WITH YOUR NAUSEA MEDICATION *UNUSUAL SHORTNESS OF BREATH *UNUSUAL BRUISING OR BLEEDING *URINARY PROBLEMS (pain or burning when urinating, or frequent urination) *BOWEL PROBLEMS (unusual diarrhea, constipation, pain near the anus) TENDERNESS IN MOUTH AND THROAT WITH OR WITHOUT PRESENCE OF ULCERS (sore throat, sores in mouth, or a toothache) UNUSUAL RASH, SWELLING OR PAIN  UNUSUAL VAGINAL DISCHARGE OR ITCHING   Items with * indicate a potential emergency and should be followed up as soon as possible or go to the Emergency Department if any problems should occur.  Please show the CHEMOTHERAPY ALERT CARD or IMMUNOTHERAPY ALERT CARD at check-in to  the Emergency Department and triage nurse.  Should you have questions after your visit or need to cancel or reschedule your appointment, please contact [facility]  Dept: [phone]  and follow the prompts.  Office hours are 8:00 a.m. to 4:30 p.m. Monday - Friday. Please note that voicemails left after 4:00 p.m. may not be returned until the following business day.  We are closed weekends and major holidays. You have access to a nurse at all times for urgent questions. Please call the main number to the clinic Dept: [phone] and follow the prompts.   For any non-urgent questions, you may also contact your provider using MyChart. We now offer e-Visits for anyone 32 and older to request care online for non-urgent symptoms. For details visit [email or web address].   Also download the MyChart app! Go to the app store, search "MyChart", open the app, select [facility], and log in with your MyChart username and password.  Due to Covid, a mask is required upon entering the hospital/clinic. If you do not have a mask, one will be given to you upon arrival. For doctor visits, patients may have 1 support person aged 79 or older with them. For treatment visits, patients cannot have anyone with them due to current Covid guidelines and our immunocompromised population.

## 2020-12-24 ENCOUNTER — Other Ambulatory Visit: Payer: Self-pay | Admitting: Pharmacist

## 2020-12-30 NOTE — Progress Notes (Signed)
Patient Care Team: [name], MD as PCP - General (Family Medicine) [name], MD as Consulting Physician (Hematology and Oncology) Bensimhon, Shaune Pascal, MD as Consulting Physician (Cardiology) Marletta Lor as Consulting Physician (Internal Medicine) Tyler Pita, MD as Consulting Physician (Radiation Oncology)  DIAGNOSIS:    ICD-10-CM   1. Primary cancer of upper outer quadrant of right female breast ([place])  C50.411       SUMMARY OF ONCOLOGIC HISTORY: Oncology History  Primary cancer of upper outer quadrant of right female breast ([place])  03/08/2012 Initial Diagnosis   Right breast invasive ductal carcinoma ER positive PR negative HER-2 positive Ki-67 44%; another breast mass biopsied in the anterior part of the breast which was also positive for malignancy that was HER-2 negative   03/14/2012 Cancer Staging   Staging form: Breast, AJCC 7th Edition - Pathologic: Stage IV (M1) - Signed by [name], NP on 06/22/2018   05/10/2012 Surgery   Right mastectomy and axillary lymph node dissection: Multifocal disease 5 cm, 1.7 cm, 1.6 cm, ER positive PR negative HER-2 positive Ki-67 44%, 3/7 lymph nodes positive   06/14/2012 - 06/12/2013 Chemotherapy   Adjuvant chemotherapy with [place] 6 followed by Herceptin maintenance   10/25/2012 - 12/11/2012 Radiation Therapy   Adjuvant radiation therapy   02/07/2013 -  Anti-estrogen oral therapy   Letrozole 2.5 mg daily   02/14/2015 Imaging   MRI spine: Large destructive T5 lesion with severe pathologic compression fracture and extensive epidural tumor, severe spinal stenosis with moderate cord compression, tumor involvement of T4   03/18/2015 PET scan   Residual enhancing soft tissue adjacent to the spinal cord. No evidence of metastatic disease. Nonspecific uptake the left nipple   03/27/2015 - 03/30/2015 Hospital Admission   T4-6 decompression for spinal cord compression (lower extremity paralysis)   04/10/2015 -   Chemotherapy   Palliative treatment with Herceptin every 3 weeks with letrozole 2.5 mg daily   04/13/2015 - 05/19/2015 Radiation Therapy   Palliative radiation treatment to the spine   09/01/2015 Imaging   CT chest abdomen pelvis: Pathologic fracture with posterior fusion at T5 no other evidence of metastatic disease in the chest abdomen pelvis   12/23/2015 Imaging   CT CAP: new nonspecific 0.6 cm lymph node was stated mediastinum needs follow-up CT, innumerable tiny groundglass pulmonary nodules throughout both lungs unchanged, patchy consolidation from radiation, T5 fracture, no mets   04/15/2016 PET scan   Rt para-spinal mass mildy hypermetabolic SUV 5.9, lytic cortical lesion 4.8 cm lesion left prox femur suv 8.8 favor osseus met disease   04/28/2016 - 05/12/2016 Radiation Therapy   Palliative XRT Left femur   05/24/2016 Procedure   Soft tissue mass biopsy back: Metastatic breast cancer ER 80%, PR 0%, Ki-67 50%, HER-2 positive ratio 2.25   06/09/2016 - 03/29/2017 Anti-estrogen oral therapy   Faslodex with Herceptin and Perjeta every 4 weeks   06/13/2016 - 06/15/2016 Hospital Admission   uncomplicated revision of previous thoracic instrumentation.   10/10/2016 PET scan   Interval development of new hypermetabolic 2.5 x 1.1 cm lesion posterior right eighth rib consistent with metastatic disease, stable right paraspinal lesion at T8, clustered soft tissue nodules in the posterior midline back near cervicothoracic junction smaller than before    11/03/2016 - 11/07/2016 Radiation Therapy   Palliative radiation to right eighth rib   11/04/2016 Imaging   Solitary contrast-enhancing lesion in the right cerebellum 6 x 2 mm concerning for metastatic lesion   11/24/2016 - 11/24/2016 Radiation  Therapy   SRS to the brain lesion   04/18/2017 PET scan   New ill-defined rounded airspace opacity in posterior right lower lobe. Differential diagnosis includes malignancy and infectious/inflammatory  process.Increased hypermetabolic lytic lesion in T8 vertebral body, consistent with bone metastasis.  Increased hypermetabolic posterior upper chest wall subcutaneous nodules, highly suspicious metastatic disease. Further improvement in right posterior eighth rib metastasis.   05/04/2017 - 07/27/2017 Chemotherapy   Kadcyla every 3 weeks palliative chemotherapy stopped for progression   07/26/2017 Imaging   Interval increase in size of the multiple enhancing masses posteriorly overlying the spine, interval development of left axillary and mediastinal adenopathy, increase in the size of lytic lesions in T8-T9 and left ninth rib, increased groundglass opacities right lower lobe lung   07/27/2017 - 05/11/2018 Chemotherapy   Palbociclib + Faslodex + trastuzumab + Pertuzumab     04/03/2018 PET scan   PET CT scan showed progression of disease not only in the bones but also in the subcutaneous nodules.   05/15/2018 - 05/25/2018 Radiation Therapy   Palliative radiation to the back   06/01/2018 - 12/27/2019 Chemotherapy   Enhertu (Transtuzumab Deruxtecan)    08/07/2018 - 08/07/2018 Radiation Therapy   T12 vertebral body SRS   01/2020 - 10/23/2020 Chemotherapy    Xeloda, Tucatinib, Herceptin    11/19/2020 -  Chemotherapy    Patient is on Treatment Plan: BREAST MARGETUXIMAB + VINORELBINE Q21D       Carcinoma of right breast, stage 4 (H[facility])  02/14/2015 Initial Diagnosis   Breast cancer metastasized to bone, right ([place])   06/01/2018 - 02/07/2020 Chemotherapy   The patient had palonosetron (ALOXI) injection 0.25 mg, 0.25 mg, Intravenous,  Once, 24 of 30 cycles Administration: 0.25 mg (06/01/2018), 0.25 mg (06/22/2018), 0.25 mg (08/24/2018), 0.25 mg (07/13/2018), 0.25 mg (08/03/2018), 0.25 mg (09/14/2018), 0.25 mg (10/05/2018), 0.25 mg (11/16/2018), 0.25 mg (10/26/2018), 0.25 mg (12/07/2018), 0.25 mg (12/28/2018), 0.25 mg (01/18/2019), 0.25 mg (03/15/2019), 0.25 mg (02/15/2019), 0.25 mg (04/12/2019), 0.25 mg (05/09/2019),  0.25 mg (06/07/2019), 0.25 mg (07/05/2019), 0.25 mg (08/09/2019), 0.25 mg (09/13/2019), 0.25 mg (10/18/2019), 0.25 mg (11/22/2019), 0.25 mg (12/27/2019) fam-trastuzumab deruxtecan-nxki (ENHERTU) 332 mg in dextrose 5 % 100 mL chemo infusion, 5.4 mg/kg = 332 mg, Intravenous,  Once, 24 of 30 cycles Dose modification: 4.4 mg/kg (original dose 5.4 mg/kg, Cycle 7, Reason: Provider Judgment), 3.2 mg/kg (original dose 5.4 mg/kg, Cycle 9, Reason: Dose not tolerated), 150 mg (original dose 5.4 mg/kg, Cycle 18, Reason: Dose not tolerated) Administration: 332 mg (06/01/2018), 332 mg (06/22/2018), 332 mg (07/13/2018), 332 mg (08/24/2018), 332 mg (08/03/2018), 332 mg (09/14/2018), 272 mg (10/05/2018), 200 mg (11/16/2018), 272 mg (10/26/2018), 200 mg (12/07/2018), 200 mg (12/28/2018), 200 mg (01/18/2019), 200 mg (03/15/2019), 200 mg (02/15/2019), 200 mg (04/12/2019), 200 mg (05/09/2019), 200 mg (06/07/2019), 150 mg (07/05/2019), 150 mg (08/09/2019), 150 mg (09/13/2019), 150 mg (10/18/2019), 150 mg (11/22/2019), 150 mg (12/27/2019)   for chemotherapy treatment.     05/29/2020 - 11/13/2020 Chemotherapy    Patient is on Treatment Plan: BREAST MARGETUXIMAB + VINORELBINE Q21D       Metastatic cancer to spine ([place])  Brain metastasis ([place])  11/18/2016 Initial Diagnosis   Brain metastasis ([place])   05/29/2020 - 11/13/2020 Chemotherapy    Patient is on Treatment Plan: BREAST MARGETUXIMAB + VINORELBINE Q21D         CHIEF COMPLIANT: Cycle 3 margetuximab and vinorelbine  INTERVAL HISTORY: ADDILYNNE OLHEISER is a 75 y.o. with above-mentioned history of metastatic breast cancer HER2 positive disease  who is here for treatment with margetuximab and vinorelbine. She presents to the clinic today for treatment.   ALLERGIES:  is allergic to other, acyclovir and related, brompheniramine, chlorhexidine, dextromethorphan, monascus purpureus went yeast, phenylephrine hcl, pravastatin sodium, tegaderm ag mesh [silver], zanaflex [tizanidine hcl], codeine, keflex  [cephalexin], naprosyn [naproxen], oxycodone, tramadol, and tussin [guaifenesin].  MEDICATIONS:  Current Outpatient Medications  Medication Sig Dispense Refill   acetaminophen (TYLENOL) 500 MG tablet Take 500 mg by mouth every 6 (six) hours as needed.     BOOSTRIX 5-2.5-18.5 LF-MCG/0.5 injection      calcitonin, salmon, (MIACALCIN) 200 UNIT/ACT nasal spray Place 1 spray into alternate nostrils daily. 3.7 mL 12   COLLAGEN PO Take by mouth.     Cranberry 250 MG CAPS Take by mouth.     dexamethasone (DECADRON) 4 MG tablet Take 1 tablet (4 mg total) by mouth daily. Take one tablet (4 mg) daily (every day in the morning). On day after chemotherapy day take two tablets in morning (32m), then resume regular daily dose. 30 tablet 1   famotidine (PEPCID) 40 MG tablet Take 40 mg by mouth daily.     gabapentin (NEURONTIN) 600 MG tablet Take 600 mg by mouth 2 (two) times daily.     HYDROcodone-acetaminophen (NORCO/VICODIN) 5-325 MG tablet You may take 1/2 to 1 tablet every four hours as needed for pain. 120 tablet 0   lidocaine-prilocaine (EMLA) cream Apply to affected area once 30 g 3   ondansetron (ZOFRAN) 8 MG tablet Take 1 tablet (8 mg total) by mouth 2 (two) times daily as needed. Start on the third day after margetuximab chemo. 30 tablet 1   OVER THE COUNTER MEDICATION Place 1 drop into both eyes 2 (two) times daily as needed (dry eyes). Over the counter lubricating eye drops     polyethylene glycol (MIRALAX / GLYCOLAX) packet Take 17 g by mouth daily as needed.     prochlorperazine (COMPAZINE) 10 MG tablet Take 1 tablet (10 mg total) by mouth every 6 (six) hours as needed (Nausea or vomiting). 30 tablet 1   spironolactone (ALDACTONE) 25 MG tablet Take 25 mg by mouth daily.     tamsulosin (FLOMAX) 0.4 MG CAPS capsule Take 1 capsule (0.4 mg total) by mouth daily. 30 capsule 3   No current facility-administered medications for this visit.   Facility-Administered Medications Ordered in Other Visits   Medication Dose Route Frequency Provider Last Rate Last Admin   cyanocobalamin ((VITAMIN B-12)) injection 1,000 mcg  1,000 mcg Intramuscular Q6 weeks GNicholas Lose MD   1,000 mcg at 03/19/20 1110   heparin lock flush 100 unit/mL  500 Units Intracatheter Once GNicholas Lose MD       sodium chloride flush (NS) 0.9 % injection 10 mL  10 mL Intracatheter Once GNicholas Lose MD        PHYSICAL EXAMINATION: ECOG PERFORMANCE STATUS: 1 - Symptomatic but completely ambulatory  Vitals:   12/31/20 1315  BP: (!) 126/49  Pulse: 69  Resp: 17  Temp: 97.7 F (36.5 C)  SpO2: 99%   Filed Weights   12/31/20 1315  Weight: 108 lb 3.2 oz (49.1 kg)     LABORATORY DATA:  I have reviewed the data as listed CMP Latest Ref Rng & Units 12/31/2020 12/18/2020 12/10/2020  Glucose 70 - 99 mg/dL 146(H) 178(H) 140(H)  BUN 8 - 23 mg/dL 32(H) 35(H) 19  Creatinine 0.44 - 1.00 mg/dL 0.91 0.90 0.90  Sodium 135 - 145 mmol/L 137 137 136  Potassium 3.5 - 5.1 mmol/L 2.8(L) 3.8 4.1  Chloride 98 - 111 mmol/L 103 101 100  CO2 22 - 32 mmol/L _0 Calcium 8.9 - 10.3 mg/dL 9.9 10.1 13.5(HH)  Total Protein 6.5 - 8.1 g/dL 6.2(L) 6.9 7.4  Total Bilirubin 0.3 - 1.2 mg/dL 0.7 0.5 0.7  Alkaline Phos 38 - 126 U/L 213(H) 233(H) 209(H)  AST 15 - 41 U/L 64(H) 65(H) 63(H)  ALT 0 - 44 U/L 77(H) 52(H) 23    Lab Results  Component Value Date   WBC 3.7 (L) 12/31/2020   HGB 11.3 (L) 12/31/2020   HCT 34.0 (L) 12/31/2020   MCV 91.6 12/31/2020   PLT 149 (L) 12/31/2020   NEUTROABS PENDING 12/31/2020    ASSESSMENT & PLAN:  Primary cancer of upper outer quadrant of right female breast ([place]) Right breast invasive ductal carcinoma ER positive PR negative HER-2 positive Ki-67 44% multifocal disease 3/7 lymph nodes positive T2 N1 M0 stage IIB status post adjuvant chemotherapy with TCH followed by Herceptin maintenance, adjuvant radiation therapy and Letrozole. MRI 02/14/2015: T5 large destructive lesion with pathologic compression  fracture and extensive epidural tumor, involvement of T4, excision metastatic carcinoma ER 60%, PR 0%, HER-2 positive ratio 2.71 average copy #7.45 03/27/2015: T4-6 decompression surgery for spinal cord compression PET-CT 04/17/16 Rt para-spinal mass mildy hypermetabolic SUV 5.9, lytic cortical lesion 4.8 cm lesion left prox femur suv 8.8 favor osseus met disease Echo 04/12/2017 shows well preserved ejection fraction of 55-60%    Treatment summary: 1. Resumed anastrozole 05/21/2015, but it has caused significant hair loss so we switched her to exemestane 25 mg once daily 07/23/2015 2. Herceptin every 3 week started 04/10/2015 3. Bone metastases: Zometa every 3 months 4. Radiation therapy to the spine (Dr. [name]) 04/13/2015 to 05/19/2015 5. Radiation therapy to left femur 04/28/2016 to 05/12/2016 6. SRS to the brain lesion 11/24/2016 7.  Kadcyla started 05/05/2017 discontinued 07/27/2017 8.Palbociclib + Faslodex + trastuzumab + Pertuzumab started 08/10/2017, stopped 04/04/2018 9. XRT to T12 07/19/2018 10. Enhertu started 06/01/18-12/27/2019 stopped for progression 11 SRS to brain January 2021 12.  Lapatinib with anastrozole stopped for progression 13.  Xeloda, Tucatinib, Herceptin 14. Margetuximab with Navelbine 11/19/2020 -------------------------------------------------------------------------------------------------------------------------- Current treatment: Margetuximab with Navelbine cycle 3 day 1 Chemo toxicities: 1.  Profound fatigue and decreased appetite and weight loss It appears that when the hypercalcemia gets worse she gets more fatigued. 2. chest wall pain: Palliative care will be following her. 3.  Severe hair loss  Hypercalcemia: Zometa every 3 weeks Deconditioning loss of muscle mass: We will send her to physical therapy for evaluation.   Scans to be done on 01/19/2021 Palliative care with Dr. Hilma Favors.   Return to clinic in 1 week for chemo    No orders of the defined  types were placed in this encounter.  The patient has a good understanding of the overall plan. she agrees with it. she will call with any problems that may develop before the next visit here.  Total time spent: 30 mins including face to face time and time spent for planning, charting and coordination of care  Rulon Eisenmenger, MD, MPH 12/31/2020  I, Thana Ates, am acting as scribe for Dr. [name].  I have reviewed the above documentation for accuracy and completeness, and I agree with the above.

## 2020-12-31 ENCOUNTER — Inpatient Hospital Stay (HOSPITAL_BASED_OUTPATIENT_CLINIC_OR_DEPARTMENT_OTHER): Payer: Medicare Other | Admitting: Hematology and Oncology

## 2020-12-31 ENCOUNTER — Inpatient Hospital Stay: Payer: Medicare Other

## 2020-12-31 ENCOUNTER — Other Ambulatory Visit: Payer: Self-pay

## 2020-12-31 ENCOUNTER — Other Ambulatory Visit: Payer: Self-pay | Admitting: Hematology and Oncology

## 2020-12-31 VITALS — BP 126/49 | HR 69 | Temp 97.7°F | Resp 17 | Ht 62.0 in | Wt 108.2 lb

## 2020-12-31 DIAGNOSIS — C50411 Malignant neoplasm of upper-outer quadrant of right female breast: Secondary | ICD-10-CM | POA: Diagnosis not present

## 2020-12-31 DIAGNOSIS — C7931 Secondary malignant neoplasm of brain: Secondary | ICD-10-CM | POA: Diagnosis not present

## 2020-12-31 DIAGNOSIS — Z170 Estrogen receptor positive status [ER+]: Secondary | ICD-10-CM | POA: Diagnosis not present

## 2020-12-31 DIAGNOSIS — C7951 Secondary malignant neoplasm of bone: Secondary | ICD-10-CM | POA: Diagnosis not present

## 2020-12-31 DIAGNOSIS — Z5112 Encounter for antineoplastic immunotherapy: Secondary | ICD-10-CM | POA: Diagnosis not present

## 2020-12-31 LAB — CBC WITH DIFFERENTIAL (CANCER CENTER ONLY)
Abs Immature Granulocytes: 0.21 10*3/uL — ABNORMAL HIGH (ref 0.00–0.07)
Basophils Absolute: 0 10*3/uL (ref 0.0–0.1)
Basophils Relative: 0 %
Eosinophils Absolute: 0 10*3/uL (ref 0.0–0.5)
Eosinophils Relative: 0 %
HCT: 34 % — ABNORMAL LOW (ref 36.0–46.0)
Hemoglobin: 11.3 g/dL — ABNORMAL LOW (ref 12.0–15.0)
Immature Granulocytes: 6 %
Lymphocytes Relative: 13 %
Lymphs Abs: 0.5 10*3/uL — ABNORMAL LOW (ref 0.7–4.0)
MCH: 30.5 pg (ref 26.0–34.0)
MCHC: 33.2 g/dL (ref 30.0–36.0)
MCV: 91.6 fL (ref 80.0–100.0)
Monocytes Absolute: 0.6 10*3/uL (ref 0.1–1.0)
Monocytes Relative: 15 %
Neutro Abs: 2.5 10*3/uL (ref 1.7–7.7)
Neutrophils Relative %: 66 %
Platelet Count: 149 10*3/uL — ABNORMAL LOW (ref 150–400)
RBC: 3.71 MIL/uL — ABNORMAL LOW (ref 3.87–5.11)
RDW: 16.1 % — ABNORMAL HIGH (ref 11.5–15.5)
WBC Count: 3.7 10*3/uL — ABNORMAL LOW (ref 4.0–10.5)
nRBC: 0.8 % — ABNORMAL HIGH (ref 0.0–0.2)

## 2020-12-31 LAB — CMP (CANCER CENTER ONLY)
ALT: 77 U/L — ABNORMAL HIGH (ref 0–44)
AST: 64 U/L — ABNORMAL HIGH (ref 15–41)
Albumin: 3.3 g/dL — ABNORMAL LOW (ref 3.5–5.0)
Alkaline Phosphatase: 213 U/L — ABNORMAL HIGH (ref 38–126)
Anion gap: 8 (ref 5–15)
BUN: 32 mg/dL — ABNORMAL HIGH (ref 8–23)
CO2: 26 mmol/L (ref 22–32)
Calcium: 9.9 mg/dL (ref 8.9–10.3)
Chloride: 103 mmol/L (ref 98–111)
Creatinine: 0.91 mg/dL (ref 0.44–1.00)
GFR, Estimated: >60 mL/min (ref >60)
Glucose, Bld: 146 mg/dL — ABNORMAL HIGH (ref 70–99)
Potassium: 2.8 mmol/L — ABNORMAL LOW (ref 3.5–5.1)
Sodium: 137 mmol/L (ref 135–145)
Total Bilirubin: 0.7 mg/dL (ref 0.3–1.2)
Total Protein: 6.2 g/dL — ABNORMAL LOW (ref 6.5–8.1)

## 2020-12-31 MED ORDER — SODIUM CHLORIDE 0.9 % IV SOLN
10.0000 mg | Freq: Once | INTRAVENOUS | Status: AC
  Administered 2020-12-31: 10 mg via INTRAVENOUS
  Filled 2020-12-31: qty 10

## 2020-12-31 MED ORDER — DEXAMETHASONE 4 MG PO TABS: 2.0000 mg | ORAL_TABLET | Freq: Every day | ORAL | 1 refills | Status: DC

## 2020-12-31 MED ORDER — SODIUM CHLORIDE 0.9 % IV SOLN
15.0000 mg/kg | Freq: Once | INTRAVENOUS | Status: AC
  Administered 2020-12-31: 750 mg via INTRAVENOUS
  Filled 2020-12-31: qty 30

## 2020-12-31 MED ORDER — PALONOSETRON HCL INJECTION 0.25 MG/5ML
0.2500 mg | Freq: Once | INTRAVENOUS | Status: AC
  Administered 2020-12-31: 0.25 mg via INTRAVENOUS
  Filled 2020-12-31: qty 5

## 2020-12-31 MED ORDER — SODIUM CHLORIDE 0.9% FLUSH
10.0000 mL | Freq: Once | INTRAVENOUS | Status: AC
  Administered 2020-12-31: 10 mL

## 2020-12-31 MED ORDER — CYANOCOBALAMIN 1000 MCG/ML IJ SOLN
1000.0000 ug | INTRAMUSCULAR | Status: DC
  Administered 2020-12-31: 1000 ug via INTRAMUSCULAR
  Filled 2020-12-31: qty 1

## 2020-12-31 MED ORDER — SODIUM CHLORIDE 0.9 % IV SOLN: Freq: Once | INTRAVENOUS | Status: AC

## 2020-12-31 MED ORDER — SODIUM CHLORIDE 0.9% FLUSH
10.0000 mL | INTRAVENOUS | Status: DC | PRN
  Administered 2020-12-31: 10 mL

## 2020-12-31 MED ORDER — POTASSIUM CHLORIDE CRYS ER 20 MEQ PO TBCR: 20.0000 meq | EXTENDED_RELEASE_TABLET | Freq: Every day | ORAL | 3 refills | Status: AC

## 2020-12-31 MED ORDER — HEPARIN SOD (PORK) LOCK FLUSH 100 UNIT/ML IV SOLN
500.0000 [IU] | Freq: Once | INTRAVENOUS | Status: AC | PRN
  Administered 2020-12-31: 500 [IU]

## 2020-12-31 MED ORDER — VINORELBINE TARTRATE CHEMO INJECTION 50 MG/5ML
20.0000 mg/m2 | Freq: Once | INTRAVENOUS | Status: AC
  Administered 2020-12-31: 30 mg via INTRAVENOUS
  Filled 2020-12-31: qty 3

## 2020-12-31 MED ORDER — ACETAMINOPHEN 325 MG PO TABS
650.0000 mg | ORAL_TABLET | Freq: Once | ORAL | Status: AC
  Administered 2020-12-31: 650 mg via ORAL
  Filled 2020-12-31: qty 2

## 2020-12-31 NOTE — Progress Notes (Signed)
Ok to proceed with current echo per [name]

## 2020-12-31 NOTE — Assessment & Plan Note (Signed)
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 MRI 02/14/2015: T5 large destructive lesion with pathologic compression fracture and extensive epidural tumor, involvement of T4, excision metastatic carcinoma ER 60%, PR 0%, HER-2 positive ratio 2.71 average copy #7.45 03/27/2015: T4-6 decompression surgery for spinal cord compression PET-CT 12/10/17Rt para-spinal mass mildy hypermetabolic SUV 5.9, lytic cortical lesion 4.8 cm lesion left prox femur suv 8.8 favor osseus met disease Echo 04/12/2017 shows well preserved ejection fraction of 55-60%  Treatment summary: 1. Resumed anastrozole 05/21/2015, but it has caused significant hair loss so we switched her to exemestane 25 mg once daily 07/23/2015 2. Herceptin every 3 week started 04/10/2015 3. Bone metastases: Zometa every 3 months 4. Radiation therapy to the spine (Dr. [name]) 04/13/2015 to 05/19/2015 5. Radiation therapy to left femur 04/28/2016 to 05/12/2016 6. SRS to the brain lesion 11/24/2016 7.Kadcyla started 05/05/2017 discontinued 07/27/2017 8.Palbociclib +Faslodex +trastuzumab+ Pertuzumabstarted 08/10/2017, stopped 04/04/2018 9.XRT to T123/04/2019 10. Enhertu started 06/01/18-12/27/2019 stopped for progression 11SRS to brain January 2021 12.Lapatinib with anastrozolestopped for progression 13.Xeloda, Tucatinib, Herceptin 14. Margetuximab with Navelbine 11/19/2020 -------------------------------------------------------------------------------------------------------------------------- Current treatment: Margetuximab with Navelbine cycle3 day 1 Chemo toxicities: 1.  Profound fatigue and decreased appetite and weight loss It appears that when the hypercalcemia gets worse she gets more fatigued. 2. chest  wall pain: Palliative care will be following her.  Hypercalcemia: Zometa every 3 weeks   Scans are scheduled for 01/12/2021 Palliative care with Dr. Hilma Favors.  Return to clinic in 1 week for chemo

## 2021-01-04 ENCOUNTER — Telehealth: Payer: Self-pay | Admitting: Hematology and Oncology

## 2021-01-04 NOTE — Telephone Encounter (Signed)
Scheduled per 8/25 los. Pt will receive an updated appt calendar per next visit appt notes

## 2021-01-06 DIAGNOSIS — C7931 Secondary malignant neoplasm of brain: Secondary | ICD-10-CM | POA: Diagnosis not present

## 2021-01-06 DIAGNOSIS — I10 Essential (primary) hypertension: Secondary | ICD-10-CM | POA: Diagnosis not present

## 2021-01-06 DIAGNOSIS — G894 Chronic pain syndrome: Secondary | ICD-10-CM | POA: Diagnosis not present

## 2021-01-06 DIAGNOSIS — C493 Malignant neoplasm of connective and soft tissue of thorax: Secondary | ICD-10-CM | POA: Diagnosis not present

## 2021-01-06 DIAGNOSIS — Z515 Encounter for palliative care: Secondary | ICD-10-CM | POA: Diagnosis not present

## 2021-01-06 DIAGNOSIS — N3281 Overactive bladder: Secondary | ICD-10-CM | POA: Diagnosis not present

## 2021-01-06 DIAGNOSIS — C7951 Secondary malignant neoplasm of bone: Secondary | ICD-10-CM | POA: Diagnosis not present

## 2021-01-06 DIAGNOSIS — C50411 Malignant neoplasm of upper-outer quadrant of right female breast: Secondary | ICD-10-CM | POA: Diagnosis not present

## 2021-01-06 DIAGNOSIS — C771 Secondary and unspecified malignant neoplasm of intrathoracic lymph nodes: Secondary | ICD-10-CM | POA: Diagnosis not present

## 2021-01-06 DIAGNOSIS — E440 Moderate protein-calorie malnutrition: Secondary | ICD-10-CM | POA: Diagnosis not present

## 2021-01-06 DIAGNOSIS — F3342 Major depressive disorder, recurrent, in full remission: Secondary | ICD-10-CM | POA: Diagnosis not present

## 2021-01-06 NOTE — Progress Notes (Signed)
Patient Care Team: [name], MD as PCP - General (Family Medicine) [name], MD as Consulting Physician (Hematology and Oncology) Bensimhon, Shaune Pascal, MD as Consulting Physician (Cardiology) Marletta Lor as Consulting Physician (Internal Medicine) Tyler Pita, MD as Consulting Physician (Radiation Oncology)  DIAGNOSIS:    ICD-10-CM   1. Primary cancer of upper outer quadrant of right female breast ([place])  C50.411       SUMMARY OF ONCOLOGIC HISTORY: Oncology History  Primary cancer of upper outer quadrant of right female breast ([place])  03/08/2012 Initial Diagnosis   Right breast invasive ductal carcinoma ER positive PR negative Heather [name]-2 positive Ki-67 44%; another breast mass biopsied in the anterior part of the breast which was also positive for malignancy that was Heather [name]-2 negative   03/14/2012 Cancer Staging   Staging form: Breast, AJCC 7th Edition - Pathologic: Stage IV (M1) - Signed by [name], NP on 06/22/2018   05/10/2012 Surgery   Right mastectomy and axillary lymph node dissection: Multifocal disease 5 cm, 1.7 cm, 1.6 cm, ER positive PR negative Heather [name]-2 positive Ki-67 44%, 3/7 lymph nodes positive   06/14/2012 - 06/12/2013 Chemotherapy   Adjuvant chemotherapy with [place] 6 followed by Herceptin maintenance   10/25/2012 - 12/11/2012 Radiation Therapy   Adjuvant radiation therapy   02/07/2013 -  Anti-estrogen oral therapy   Letrozole 2.5 mg daily   02/14/2015 Imaging   MRI spine: Large destructive T5 lesion with severe pathologic compression fracture and extensive epidural tumor, severe spinal stenosis with moderate cord compression, tumor involvement of T4   03/18/2015 PET scan   Residual enhancing soft tissue adjacent to the spinal cord. No evidence of metastatic disease. Nonspecific uptake the left nipple   03/27/2015 - 03/30/2015 Hospital Admission   T4-6 decompression for spinal cord compression (lower extremity paralysis)   04/10/2015 -   Chemotherapy   Palliative treatment with Herceptin every 3 weeks with letrozole 2.5 mg daily   04/13/2015 - 05/19/2015 Radiation Therapy   Palliative radiation treatment to the spine   09/01/2015 Imaging   CT chest abdomen pelvis: Pathologic fracture with posterior fusion at T5 no other evidence of metastatic disease in the chest abdomen pelvis   12/23/2015 Imaging   CT CAP: new nonspecific 0.6 cm lymph node was stated mediastinum needs follow-up CT, innumerable tiny groundglass pulmonary nodules throughout both lungs unchanged, patchy consolidation from radiation, T5 fracture, no mets   04/15/2016 PET scan   Rt para-spinal mass mildy hypermetabolic SUV 5.9, lytic cortical lesion 4.8 cm lesion left prox femur suv 8.8 favor osseus met disease   04/28/2016 - 05/12/2016 Radiation Therapy   Palliative XRT Left femur   05/24/2016 Procedure   Soft tissue mass biopsy back: Metastatic breast cancer ER 80%, PR 0%, Ki-67 50%, Heather [name]-2 positive ratio 2.25   06/09/2016 - 03/29/2017 Anti-estrogen oral therapy   Faslodex with Herceptin and Perjeta every 4 weeks   06/13/2016 - 06/15/2016 Hospital Admission   uncomplicated revision of previous thoracic instrumentation.   10/10/2016 PET scan   Interval development of new hypermetabolic 2.5 x 1.1 cm lesion posterior right eighth rib consistent with metastatic disease, stable right paraspinal lesion at T8, clustered soft tissue nodules in the posterior midline back near cervicothoracic junction smaller than before    11/03/2016 - 11/07/2016 Radiation Therapy   Palliative radiation to right eighth rib   11/04/2016 Imaging   Solitary contrast-enhancing lesion in the right cerebellum 6 x 2 mm concerning for metastatic lesion   11/24/2016 - 11/24/2016 Radiation  Therapy   SRS to the brain lesion   04/18/2017 PET scan   New ill-defined rounded airspace opacity in posterior right lower lobe. Differential diagnosis includes malignancy and infectious/inflammatory  process.Increased hypermetabolic lytic lesion in T8 vertebral body, consistent with bone metastasis.  Increased hypermetabolic posterior upper chest wall subcutaneous nodules, highly suspicious metastatic disease. Further improvement in right posterior eighth rib metastasis.   05/04/2017 - 07/27/2017 Chemotherapy   Kadcyla every 3 weeks palliative chemotherapy stopped for progression   07/26/2017 Imaging   Interval increase in size of the multiple enhancing masses posteriorly overlying the spine, interval development of left axillary and mediastinal adenopathy, increase in the size of lytic lesions in T8-T9 and left ninth rib, increased groundglass opacities right lower lobe lung   07/27/2017 - 05/11/2018 Chemotherapy   Palbociclib + Faslodex + trastuzumab + Pertuzumab     04/03/2018 PET scan   PET CT scan showed progression of disease not only in the bones but also in the subcutaneous nodules.   05/15/2018 - 05/25/2018 Radiation Therapy   Palliative radiation to the back   06/01/2018 - 12/27/2019 Chemotherapy   Enhertu (Transtuzumab Deruxtecan)    08/07/2018 - 08/07/2018 Radiation Therapy   T12 vertebral body SRS   01/2020 - 10/23/2020 Chemotherapy    Xeloda, Tucatinib, Herceptin    11/19/2020 -  Chemotherapy    Patient is on Treatment Plan: BREAST MARGETUXIMAB + VINORELBINE Q21D       Carcinoma of right breast, stage 4 (HCC)  02/14/2015 Initial Diagnosis   Breast cancer metastasized to bone, right ([place])   06/01/2018 - 02/07/2020 Chemotherapy   The patient had palonosetron (ALOXI) injection 0.25 mg, 0.25 mg, Intravenous,  Once, 24 of 30 cycles Administration: 0.25 mg (06/01/2018), 0.25 mg (06/22/2018), 0.25 mg (08/24/2018), 0.25 mg (07/13/2018), 0.25 mg (08/03/2018), 0.25 mg (09/14/2018), 0.25 mg (10/05/2018), 0.25 mg (11/16/2018), 0.25 mg (10/26/2018), 0.25 mg (12/07/2018), 0.25 mg (12/28/2018), 0.25 mg (01/18/2019), 0.25 mg (03/15/2019), 0.25 mg (02/15/2019), 0.25 mg (04/12/2019), 0.25 mg (05/09/2019),  0.25 mg (06/07/2019), 0.25 mg (07/05/2019), 0.25 mg (08/09/2019), 0.25 mg (09/13/2019), 0.25 mg (10/18/2019), 0.25 mg (11/22/2019), 0.25 mg (12/27/2019) fam-trastuzumab deruxtecan-nxki (ENHERTU) 332 mg in dextrose 5 % 100 mL chemo infusion, 5.4 mg/kg = 332 mg, Intravenous,  Once, 24 of 30 cycles Dose modification: 4.4 mg/kg (original dose 5.4 mg/kg, Cycle 7, Reason: Provider Judgment), 3.2 mg/kg (original dose 5.4 mg/kg, Cycle 9, Reason: Dose not tolerated), 150 mg (original dose 5.4 mg/kg, Cycle 18, Reason: Dose not tolerated) Administration: 332 mg (06/01/2018), 332 mg (06/22/2018), 332 mg (07/13/2018), 332 mg (08/24/2018), 332 mg (08/03/2018), 332 mg (09/14/2018), 272 mg (10/05/2018), 200 mg (11/16/2018), 272 mg (10/26/2018), 200 mg (12/07/2018), 200 mg (12/28/2018), 200 mg (01/18/2019), 200 mg (03/15/2019), 200 mg (02/15/2019), 200 mg (04/12/2019), 200 mg (05/09/2019), 200 mg (06/07/2019), 150 mg (07/05/2019), 150 mg (08/09/2019), 150 mg (09/13/2019), 150 mg (10/18/2019), 150 mg (11/22/2019), 150 mg (12/27/2019)   for chemotherapy treatment.     05/29/2020 - 11/13/2020 Chemotherapy    Patient is on Treatment Plan: BREAST MARGETUXIMAB + VINORELBINE Q21D       Metastatic cancer to spine ([place])  Brain metastasis ([place])  11/18/2016 Initial Diagnosis   Brain metastasis ([place])   05/29/2020 - 11/13/2020 Chemotherapy    Patient is on Treatment Plan: BREAST MARGETUXIMAB + VINORELBINE Q21D         CHIEF COMPLIANT: Day 8, Cycle 3 margetuximab and vinorelbine  INTERVAL HISTORY: Heather [name] is a 75 y.o. with above-mentioned history of metastatic breast cancer HER2  positive disease who is here for treatment with margetuximab and vinorelbine. Heather [name] presents to the clinic today for treatment.  Heather [name] feels weak and tired.  Heather [name] taste and appetite are poor.  Heather [name] is using a wheelchair for ambulation.  Pain is under reasonable control.  Leg strength is extremely poor.  ALLERGIES:  is allergic to other, acyclovir and related, brompheniramine,  chlorhexidine, dextromethorphan, monascus purpureus went yeast, phenylephrine hcl, pravastatin sodium, tegaderm ag mesh [silver], zanaflex [tizanidine hcl], codeine, keflex [cephalexin], naprosyn [naproxen], oxycodone, tramadol, and tussin [guaifenesin].  MEDICATIONS:  Current Outpatient Medications  Medication Sig Dispense Refill   acetaminophen (TYLENOL) 500 MG tablet Take 500 mg by mouth every 6 (six) hours as needed.     BOOSTRIX 5-2.5-18.5 LF-MCG/0.5 injection      calcitonin, salmon, (MIACALCIN) 200 UNIT/ACT nasal spray Place 1 spray into alternate nostrils daily. 3.7 mL 12   COLLAGEN PO Take by mouth.     Cranberry 250 MG CAPS Take by mouth.     dexamethasone (DECADRON) 4 MG tablet Take 0.5 tablets (2 mg total) by mouth daily. Take one tablet (4 mg) daily (every day in the morning). On day after chemotherapy day take two tablets in morning (51m), then resume regular daily dose. 30 tablet 1   famotidine (PEPCID) 40 MG tablet Take 40 mg by mouth daily.     gabapentin (NEURONTIN) 600 MG tablet Take 600 mg by mouth 2 (two) times daily.     HYDROcodone-acetaminophen (NORCO/VICODIN) 5-325 MG tablet You may take 1/2 to 1 tablet every four hours as needed for pain. 120 tablet 0   lidocaine-prilocaine (EMLA) cream Apply to affected area once 30 g 3   ondansetron (ZOFRAN) 8 MG tablet Take 1 tablet (8 mg total) by mouth 2 (two) times daily as needed. Start on the third day after margetuximab chemo. 30 tablet 1   OVER THE COUNTER MEDICATION Place 1 drop into both eyes 2 (two) times daily as needed (dry eyes). Over the counter lubricating eye drops     polyethylene glycol (MIRALAX / GLYCOLAX) packet Take 17 g by mouth daily as needed.     potassium chloride SA (KLOR-CON) 20 MEQ tablet Take 1 tablet (20 mEq total) by mouth daily. 30 tablet 3   prochlorperazine (COMPAZINE) 10 MG tablet Take 1 tablet (10 mg total) by mouth every 6 (six) hours as needed (Nausea or vomiting). 30 tablet 1   spironolactone  (ALDACTONE) 25 MG tablet Take 25 mg by mouth daily.     tamsulosin (FLOMAX) 0.4 MG CAPS capsule Take 1 capsule (0.4 mg total) by mouth daily. 30 capsule 3   No current facility-administered medications for this visit.   Facility-Administered Medications Ordered in Other Visits  Medication Dose Route Frequency Provider Last Rate Last Admin   0.9 %  sodium chloride infusion   Intravenous Once GNicholas Lose MD       cyanocobalamin ((VITAMIN B-12)) injection 1,000 mcg  1,000 mcg Intramuscular Q6 weeks GNicholas Lose MD   1,000 mcg at 03/19/20 1110   heparin lock flush 100 unit/mL  500 Units Intracatheter Once GNicholas Lose MD       sodium chloride flush (NS) 0.9 % injection 10 mL  10 mL Intracatheter Once GNicholas Lose MD       sodium chloride flush (NS) 0.9 % injection 10 mL  10 mL Intracatheter PRN GNicholas Lose MD   10 mL at 01/07/21 1649    PHYSICAL EXAMINATION: ECOG PERFORMANCE STATUS: 2 - Symptomatic, <50% confined to  bed  Vitals:   01/07/21 1405  BP: (!) 136/51  Pulse: 75  Resp: 16  Temp: 97.9 F (36.6 C)  SpO2: 96%   Filed Weights   01/07/21 1405  Weight: 105 lb 8 oz (47.9 kg)    LABORATORY DATA:  I have reviewed the data as listed CMP Latest Ref Rng & Units 01/07/2021 12/31/2020 12/18/2020  Glucose 70 - 99 mg/dL 131(H) 146(H) 178(H)  BUN 8 - 23 mg/dL 27(H) 32(H) 35(H)  Creatinine 0.44 - 1.00 mg/dL 0.79 0.91 0.90  Sodium 135 - 145 mmol/L 136 137 137  Potassium 3.5 - 5.1 mmol/L 3.7 2.8(L) 3.8  Chloride 98 - 111 mmol/L 100 103 101  CO2 22 - 32 mmol/L _0 Calcium 8.9 - 10.3 mg/dL 11.2(H) 9.9 10.1  Total Protein 6.5 - 8.1 g/dL 6.6 6.2(L) 6.9  Total Bilirubin 0.3 - 1.2 mg/dL 0.9 0.7 0.5  Alkaline Phos 38 - 126 U/L 221(H) 213(H) 233(H)  AST 15 - 41 U/L 65(H) 64(H) 65(H)  ALT 0 - 44 U/L 81(H) 77(H) 52(H)    Lab Results  Component Value Date   WBC 6.5 01/07/2021   HGB 11.2 (L) 01/07/2021   HCT 34.0 (L) 01/07/2021   MCV 91.4 01/07/2021   PLT 137 (L) 01/07/2021    NEUTROABS 5.9 01/07/2021    ASSESSMENT & PLAN:  Primary cancer of upper outer quadrant of right female breast ([place]) Right breast invasive ductal carcinoma ER positive PR negative Heather [name]-2 positive Ki-67 44% multifocal disease 3/7 lymph nodes positive T2 N1 M0 stage IIB status post adjuvant chemotherapy with TCH followed by Herceptin maintenance, adjuvant radiation therapy and Letrozole. MRI 02/14/2015: T5 large destructive lesion with pathologic compression fracture and extensive epidural tumor, involvement of T4, excision metastatic carcinoma ER 60%, PR 0%, Heather [name]-2 positive ratio 2.71 average copy #7.45 03/27/2015: T4-6 decompression surgery for spinal cord compression PET-CT 04/17/16 Rt para-spinal mass mildy hypermetabolic SUV 5.9, lytic cortical lesion 4.8 cm lesion left prox femur suv 8.8 favor osseus met disease Echo 04/12/2017 shows well preserved ejection fraction of 55-60%    Treatment summary: 1. Resumed anastrozole 05/21/2015, but it has caused significant hair loss so we switched Heather [name] to exemestane 25 mg once daily 07/23/2015 2. Herceptin every 3 week started 04/10/2015 3. Bone metastases: Zometa every 3 months 4. Radiation therapy to the spine (Dr. [name]) 04/13/2015 to 05/19/2015 5. Radiation therapy to left femur 04/28/2016 to 05/12/2016 6. SRS to the brain lesion 11/24/2016 7.  Kadcyla started 05/05/2017 discontinued 07/27/2017 8.Palbociclib + Faslodex + trastuzumab + Pertuzumab started 08/10/2017, stopped 04/04/2018 9. XRT to T12 07/19/2018 10. Enhertu started 06/01/18-12/27/2019 stopped for progression 11 SRS to brain January 2021 12.  Lapatinib with anastrozole stopped for progression 13.  Xeloda, Tucatinib, Herceptin 14. Margetuximab with Navelbine 11/19/2020 -------------------------------------------------------------------------------------------------------------------------- Current treatment: Margetuximab with Navelbine cycle 3 day 1 Chemo toxicities: 1.  Profound fatigue  and decreased appetite and weight loss It appears that when the hypercalcemia gets worse Heather [name] gets more fatigued. 2. chest wall pain: Palliative care will be following Heather [name]. 3.  Severe hair loss   Hypercalcemia: Zometa every 3 weeks Deconditioning loss of muscle mass: We will send Heather [name] to physical therapy for evaluation.   Scans to be done on 01/19/2021 Palliative care with Dr. Hilma Favors. Return to clinic in 2 weeks after scans    No orders of the defined types were placed in this encounter.  The patient has a good understanding of the overall plan. Heather [name] agrees with it. Heather [name]  will call with any problems that may develop before the next visit here.  Total time spent: 30 mins including face to face time and time spent for planning, charting and coordination of care  Rulon Eisenmenger, MD, MPH 01/07/2021  I, Thana Ates, am acting as scribe for Dr. [name].  I have reviewed the above documentation for accuracy and completeness, and I agree with the above.

## 2021-01-07 ENCOUNTER — Inpatient Hospital Stay: Payer: Medicare Other

## 2021-01-07 ENCOUNTER — Other Ambulatory Visit: Payer: Self-pay

## 2021-01-07 ENCOUNTER — Inpatient Hospital Stay: Payer: Medicare Other | Attending: Hematology and Oncology

## 2021-01-07 ENCOUNTER — Inpatient Hospital Stay (HOSPITAL_BASED_OUTPATIENT_CLINIC_OR_DEPARTMENT_OTHER): Payer: Medicare Other | Admitting: Hematology and Oncology

## 2021-01-07 DIAGNOSIS — C50411 Malignant neoplasm of upper-outer quadrant of right female breast: Secondary | ICD-10-CM

## 2021-01-07 DIAGNOSIS — Z79899 Other long term (current) drug therapy: Secondary | ICD-10-CM | POA: Insufficient documentation

## 2021-01-07 DIAGNOSIS — R410 Disorientation, unspecified: Secondary | ICD-10-CM | POA: Insufficient documentation

## 2021-01-07 DIAGNOSIS — Z87440 Personal history of urinary (tract) infections: Secondary | ICD-10-CM | POA: Insufficient documentation

## 2021-01-07 DIAGNOSIS — Z79811 Long term (current) use of aromatase inhibitors: Secondary | ICD-10-CM | POA: Diagnosis not present

## 2021-01-07 DIAGNOSIS — Z9221 Personal history of antineoplastic chemotherapy: Secondary | ICD-10-CM | POA: Insufficient documentation

## 2021-01-07 DIAGNOSIS — Z853 Personal history of malignant neoplasm of breast: Secondary | ICD-10-CM | POA: Insufficient documentation

## 2021-01-07 DIAGNOSIS — Z800 Family history of malignant neoplasm of digestive organs: Secondary | ICD-10-CM | POA: Diagnosis not present

## 2021-01-07 DIAGNOSIS — Z8042 Family history of malignant neoplasm of prostate: Secondary | ICD-10-CM | POA: Diagnosis not present

## 2021-01-07 DIAGNOSIS — Z5111 Encounter for antineoplastic chemotherapy: Secondary | ICD-10-CM | POA: Diagnosis not present

## 2021-01-07 DIAGNOSIS — C7951 Secondary malignant neoplasm of bone: Secondary | ICD-10-CM | POA: Diagnosis not present

## 2021-01-07 DIAGNOSIS — Z823 Family history of stroke: Secondary | ICD-10-CM | POA: Insufficient documentation

## 2021-01-07 DIAGNOSIS — Z833 Family history of diabetes mellitus: Secondary | ICD-10-CM | POA: Insufficient documentation

## 2021-01-07 DIAGNOSIS — G8220 Paraplegia, unspecified: Secondary | ICD-10-CM | POA: Insufficient documentation

## 2021-01-07 DIAGNOSIS — Z9011 Acquired absence of right breast and nipple: Secondary | ICD-10-CM | POA: Diagnosis not present

## 2021-01-07 DIAGNOSIS — R0600 Dyspnea, unspecified: Secondary | ICD-10-CM | POA: Diagnosis not present

## 2021-01-07 DIAGNOSIS — R059 Cough, unspecified: Secondary | ICD-10-CM | POA: Insufficient documentation

## 2021-01-07 DIAGNOSIS — R0602 Shortness of breath: Secondary | ICD-10-CM | POA: Insufficient documentation

## 2021-01-07 DIAGNOSIS — Z923 Personal history of irradiation: Secondary | ICD-10-CM | POA: Insufficient documentation

## 2021-01-07 DIAGNOSIS — Z885 Allergy status to narcotic agent status: Secondary | ICD-10-CM | POA: Diagnosis not present

## 2021-01-07 DIAGNOSIS — Z170 Estrogen receptor positive status [ER+]: Secondary | ICD-10-CM | POA: Insufficient documentation

## 2021-01-07 DIAGNOSIS — C7931 Secondary malignant neoplasm of brain: Secondary | ICD-10-CM | POA: Diagnosis not present

## 2021-01-07 DIAGNOSIS — Z806 Family history of leukemia: Secondary | ICD-10-CM | POA: Diagnosis not present

## 2021-01-07 DIAGNOSIS — R5383 Other fatigue: Secondary | ICD-10-CM | POA: Insufficient documentation

## 2021-01-07 LAB — CBC WITH DIFFERENTIAL (CANCER CENTER ONLY)
Abs Immature Granulocytes: 0.08 K/uL — ABNORMAL HIGH (ref 0.00–0.07)
Basophils Absolute: 0 K/uL (ref 0.0–0.1)
Basophils Relative: 0 %
Eosinophils Absolute: 0 K/uL (ref 0.0–0.5)
Eosinophils Relative: 0 %
HCT: 34 % — ABNORMAL LOW (ref 36.0–46.0)
Hemoglobin: 11.2 g/dL — ABNORMAL LOW (ref 12.0–15.0)
Immature Granulocytes: 1 %
Lymphocytes Relative: 5 %
Lymphs Abs: 0.4 K/uL — ABNORMAL LOW (ref 0.7–4.0)
MCH: 30.1 pg (ref 26.0–34.0)
MCHC: 32.9 g/dL (ref 30.0–36.0)
MCV: 91.4 fL (ref 80.0–100.0)
Monocytes Absolute: 0.2 K/uL (ref 0.1–1.0)
Monocytes Relative: 3 %
Neutro Abs: 5.9 K/uL (ref 1.7–7.7)
Neutrophils Relative %: 91 %
Platelet Count: 137 K/uL — ABNORMAL LOW (ref 150–400)
RBC: 3.72 MIL/uL — ABNORMAL LOW (ref 3.87–5.11)
RDW: 16.1 % — ABNORMAL HIGH (ref 11.5–15.5)
WBC Count: 6.5 K/uL (ref 4.0–10.5)
nRBC: 0 % (ref 0.0–0.2)

## 2021-01-07 LAB — CMP (CANCER CENTER ONLY)
ALT: 81 U/L — ABNORMAL HIGH (ref 0–44)
AST: 65 U/L — ABNORMAL HIGH (ref 15–41)
Albumin: 3.4 g/dL — ABNORMAL LOW (ref 3.5–5.0)
Alkaline Phosphatase: 221 U/L — ABNORMAL HIGH (ref 38–126)
Anion gap: 10 (ref 5–15)
BUN: 27 mg/dL — ABNORMAL HIGH (ref 8–23)
CO2: 26 mmol/L (ref 22–32)
Calcium: 11.2 mg/dL — ABNORMAL HIGH (ref 8.9–10.3)
Chloride: 100 mmol/L (ref 98–111)
Creatinine: 0.79 mg/dL (ref 0.44–1.00)
GFR, Estimated: >60 mL/min
Glucose, Bld: 131 mg/dL — ABNORMAL HIGH (ref 70–99)
Potassium: 3.7 mmol/L (ref 3.5–5.1)
Sodium: 136 mmol/L (ref 135–145)
Total Bilirubin: 0.9 mg/dL (ref 0.3–1.2)
Total Protein: 6.6 g/dL (ref 6.5–8.1)

## 2021-01-07 MED ORDER — PROCHLORPERAZINE MALEATE 10 MG PO TABS: 5.0000 mg | ORAL_TABLET | Freq: Once | ORAL | Status: AC

## 2021-01-07 MED ORDER — SODIUM CHLORIDE 0.9% FLUSH
10.0000 mL | Freq: Once | INTRAVENOUS | Status: AC
  Administered 2021-01-07: 10 mL

## 2021-01-07 MED ORDER — PROCHLORPERAZINE MALEATE 10 MG PO TABS
ORAL_TABLET | ORAL | Status: AC
  Administered 2021-01-07: 5 mg via ORAL
  Filled 2021-01-07: qty 5

## 2021-01-07 MED ORDER — SODIUM CHLORIDE 0.9% FLUSH
10.0000 mL | INTRAVENOUS | Status: DC | PRN
  Administered 2021-01-07: 10 mL

## 2021-01-07 MED ORDER — SODIUM CHLORIDE 0.9 % IV SOLN: Freq: Once | INTRAVENOUS | Status: DC

## 2021-01-07 MED ORDER — ZOLEDRONIC ACID 4 MG/100ML IV SOLN: 4.0000 mg | Freq: Once | INTRAVENOUS | Status: DC

## 2021-01-07 MED ORDER — HEPARIN SOD (PORK) LOCK FLUSH 100 UNIT/ML IV SOLN
500.0000 [IU] | Freq: Once | INTRAVENOUS | Status: AC | PRN
  Administered 2021-01-07: 500 [IU]

## 2021-01-07 MED ORDER — VINORELBINE TARTRATE CHEMO INJECTION 50 MG/5ML
20.0000 mg/m2 | Freq: Once | INTRAVENOUS | Status: AC
  Administered 2021-01-07: 30 mg via INTRAVENOUS
  Filled 2021-01-07: qty 3

## 2021-01-07 MED ORDER — SODIUM CHLORIDE 0.9 % IV SOLN: Freq: Once | INTRAVENOUS | Status: AC

## 2021-01-07 NOTE — Progress Notes (Signed)
OK to treat w/ ALT today per [name].  Kennith Center, Pharm.D., CPP 01/07/2021@4 :18 PM

## 2021-01-07 NOTE — Patient Instructions (Signed)
[facility]  Discharge Instructions: Thank you for choosing [facility] to provide your oncology and hematology care.   If you have a lab appointment with the [facility], please go directly to the [facility] and check in at the registration area.   Wear comfortable clothing and clothing appropriate for easy access to any Portacath or PICC line.   We strive to give you quality time with your provider. You may need to reschedule your appointment if you arrive late (15 or more minutes).  Arriving late affects you and other patients whose appointments are after yours.  Also, if you miss three or more appointments without notifying the office, you may be dismissed from the clinic at the provider's discretion.      For prescription refill requests, have your pharmacy contact our office and allow 72 hours for refills to be completed.    Today you received the following chemotherapy and/or immunotherapy agents Vinorelbine      To help prevent nausea and vomiting after your treatment, we encourage you to take your nausea medication as directed.  BELOW ARE SYMPTOMS THAT SHOULD BE REPORTED IMMEDIATELY: *FEVER GREATER THAN 100.4 F (38 C) OR HIGHER *CHILLS OR SWEATING *NAUSEA AND VOMITING THAT IS NOT CONTROLLED WITH YOUR NAUSEA MEDICATION *UNUSUAL SHORTNESS OF BREATH *UNUSUAL BRUISING OR BLEEDING *URINARY PROBLEMS (pain or burning when urinating, or frequent urination) *BOWEL PROBLEMS (unusual diarrhea, constipation, pain near the anus) TENDERNESS IN MOUTH AND THROAT WITH OR WITHOUT PRESENCE OF ULCERS (sore throat, sores in mouth, or a toothache) UNUSUAL RASH, SWELLING OR PAIN  UNUSUAL VAGINAL DISCHARGE OR ITCHING   Items with * indicate a potential emergency and should be followed up as soon as possible or go to the Emergency Department if any problems should occur.  Please show the CHEMOTHERAPY ALERT CARD or IMMUNOTHERAPY ALERT CARD at check-in to  the Emergency Department and triage nurse.  Should you have questions after your visit or need to cancel or reschedule your appointment, please contact [facility]  Dept: [phone]  and follow the prompts.  Office hours are 8:00 a.m. to 4:30 p.m. Monday - Friday. Please note that voicemails left after 4:00 p.m. may not be returned until the following business day.  We are closed weekends and major holidays. You have access to a nurse at all times for urgent questions. Please call the main number to the clinic Dept: [phone] and follow the prompts.   For any non-urgent questions, you may also contact your provider using MyChart. We now offer e-Visits for anyone 83 and older to request care online for non-urgent symptoms. For details visit [email or web address].   Also download the MyChart app! Go to the app store, search "MyChart", open the app, select [facility], and log in with your MyChart username and password.  Due to Covid, a mask is required upon entering the hospital/clinic. If you do not have a mask, one will be given to you upon arrival. For doctor visits, patients may have 1 support person aged 46 or older with them. For treatment visits, patients cannot have anyone with them due to current Covid guidelines and our immunocompromised population.

## 2021-01-07 NOTE — Progress Notes (Signed)
Pt spoke with provider about holding off Zometa treatment today until next treatment so the pt can enjoy the Holiday weekend without being in pain.   Per [name], "OK To Treat w/elevated ALT".

## 2021-01-07 NOTE — Assessment & Plan Note (Signed)
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 MRI 02/14/2015: T5 large destructive lesion with pathologic compression fracture and extensive epidural tumor, involvement of T4, excision metastatic carcinoma ER 60%, PR 0%, HER-2 positive ratio 2.71 average copy #7.45 03/27/2015: T4-6 decompression surgery for spinal cord compression PET-CT 12/10/17Rt para-spinal mass mildy hypermetabolic SUV 5.9, lytic cortical lesion 4.8 cm lesion left prox femur suv 8.8 favor osseus met disease Echo 04/12/2017 shows well preserved ejection fraction of 55-60%  Treatment summary: 1. Resumed anastrozole 05/21/2015, but it has caused significant hair loss so we switched her to exemestane 25 mg once daily 07/23/2015 2. Herceptin every 3 week started 04/10/2015 3. Bone metastases: Zometa every 3 months 4. Radiation therapy to the spine (Dr. [name]) 04/13/2015 to 05/19/2015 5. Radiation therapy to left femur 04/28/2016 to 05/12/2016 6. SRS to the brain lesion 11/24/2016 7.Kadcyla started 05/05/2017 discontinued 07/27/2017 8.Palbociclib +Faslodex +trastuzumab+ Pertuzumabstarted 08/10/2017, stopped 04/04/2018 9.XRT to T123/04/2019 10. Enhertu started 06/01/18-12/27/2019 stopped for progression 11SRS to brain January 2021 12.Lapatinib with anastrozolestopped for progression 13.Xeloda, Tucatinib, Herceptin 14. Margetuximab with Navelbine 11/19/2020 -------------------------------------------------------------------------------------------------------------------------- Current treatment: Margetuximab with Navelbine cycle3 day 1 Chemo toxicities: 1.Profound fatigue and decreased appetite and weight loss It appears that when the hypercalcemia gets worse she gets more fatigued. 2.chest  wall pain: Palliative care will be following her. 3.  Severe hair loss  Hypercalcemia: Zometaevery 3 weeks Deconditioning loss of muscle mass: We will send her to physical therapy for evaluation.  Scans to be done on 01/19/2021 Palliative care with Dr. Hilma Favors. Return to clinic in 2 weeks after scans

## 2021-01-13 ENCOUNTER — Telehealth: Payer: Self-pay | Admitting: Hematology and Oncology

## 2021-01-13 NOTE — Telephone Encounter (Signed)
Scheduled appt per 9/1 los - patient is aware of appt date and time

## 2021-01-14 ENCOUNTER — Ambulatory Visit
Admission: RE | Admit: 2021-01-14 | Discharge: 2021-01-14 | Disposition: A | Discharge status: Home / Self Care | Payer: Medicare Other | Source: Ambulatory Visit | Attending: Urology | Admitting: Urology

## 2021-01-14 ENCOUNTER — Telehealth: Payer: Self-pay

## 2021-01-14 ENCOUNTER — Other Ambulatory Visit: Payer: Self-pay | Admitting: Urology

## 2021-01-14 DIAGNOSIS — C7951 Secondary malignant neoplasm of bone: Secondary | ICD-10-CM | POA: Insufficient documentation

## 2021-01-14 MED ORDER — SULFAMETHOXAZOLE-TRIMETHOPRIM 800-160 MG PO TABS: 1.0000 | ORAL_TABLET | Freq: Two times a day (BID) | ORAL | 1 refills | Status: AC

## 2021-01-14 NOTE — Progress Notes (Signed)
  Radiation Oncology         ([phone]) [phone] ________________________________  Name: Heather [name] MRN: [id number]  Date: 12/16/2020  DOB: [birth year]  End of Treatment Note  Diagnosis:   75 yo woman with an enlarging occiput and left orbit metastasis from stage IV breast cancer     Indication for treatment:  Palliative SBRT       Radiation treatment dates:   12/03/20 - 12/16/20  Site/dose:   The targets in the occiput and left orbit were treated to 25 Gy in 5 fractions of 5 Gy  Beams/energy:   The patient was treated using stereotactic body radiotherapy according to a 3D conformal radiotherapy plan.  Volumetric arc fields were employed to deliver 6 MV X-rays.  Image guidance was performed with per fraction cone beam CT prior to treatment under personal MD supervision.  Immobilization was achieved using BodyFix Pillow.  Narrative: The patient tolerated radiation treatment relatively well. She did report some blurred vision and burning sensation in the left eye during treatment but otherwise was without any ill side effects.  Plan: The patient has completed radiation treatment. The patient will return to radiation oncology clinic for routine followup in one month. I advised them to call or return sooner if they have any questions or concerns related to their recovery or treatment. ________________________________  Sheral Apley. [name], M.D.

## 2021-01-14 NOTE — Telephone Encounter (Signed)
Left message in reference to patient's missed visit today. Visit was a telephone call only, Per Ashlyn PA-C.

## 2021-01-14 NOTE — Progress Notes (Signed)
Radiation Oncology         [phone] ________________________________  Name: Heather [name] MRN: [id number]  Date: 01/14/2021  DOB: 07-14-1945  Post Treatment Note  CC: [name], MD  [name], MD  Diagnosis:   75 yo woman with an enlarging occiput and left orbit metastasis from stage IV breast cancer  Interval Since Last Radiation:  4 weeks   12/03/20 - 12/16/20:  The targets in the occiput and left orbit were treated to 25 Gy in 5 fractions of 5 Gy  11/1-11/11:   SRS// The right temporal target was treated to 25 Gy in 5 fractions of SRS   01/27/20 - 02/07/20: The two metastatic lesions in the right femur were treated to 30 Gy in 10 fractions.   05/31/19: SRS brain/ The 84mm central cerebellar target was treated to a prescription dose of 20 Gy in a single fraction.   08/07/18:  SRS Spine// The targeted metastases in the T12 and L2 vertebral bodies were treated to a prescription dose of 18 Gy in 1 fraction.   05/14/2018 - 05/25/2018:  Chest Wall, Right Upper Posterior subcutaneous lesions / treated to 30 Gy delivered in 10 fractions of 3 Gy   05/18/2017 - 05/31/2017:  The thoracic spine (T7-T9) was treated to 30 Gy in 10 fractions of 3 Gy.   6/27-7/18/18 SRS and palliative treatment: 1.  The rib 8th was treated to 30 Gy in 10 fractions 2.  Right cerebellar 6 mm target was treated using 3 Dynamic Conformal Arcs to a prescription dose of 20 Gy in one fraction   04/27/16-05/12/16:  Left femur/ 30 Gy in 10 fractions   04/13/2015-05/19/2015 Postop:  55 Gy in 25 fractions of 2.2 Gy to the thoracic spine   10/25/12-12/11/12: 50.4 Gy to the right chest wall and regional lymph nodes in 28 fractions with a 10 Gy boost to the right chest wall in 1 session.    Narrative:  I spoke with the patient to conduct her routine scheduled 1 month follow up visit via telephone to spare the patient unnecessary potential exposure in the healthcare setting during the current COVID-19 pandemic.  The  patient was notified in advance and gave permission to proceed with this visit format.  She tolerated her recent course of palliative stereotactic radiation treatment relatively well. She did report some blurred vision and burning sensation in the left eye during treatment but otherwise was without any ill side effects.                              On review of systems, the patient states that she is doing fair in general but continues with severe, generalized weakness with a rapid decline in her performance status. She has had 2 falls in the home this week secondary to the weakness. She denies any imbalance/dizziness, headaches, loss of bowel or bladder function, paraesthesias in the upper or lower extremities or recent changes in her visual or auditory acuity. We had previously discussed some progressive low back pain and recent CT and bone scan imaging that suggest progressive disease at L1, L3 and the sacrum. However, this has remained well controlled with her pain medications to date.  She denies abdominal pain, N/V/D. She is being followed by palliative care,  NP Joelene Millin from [facility]. She has noted cloudy, malodorous urine over the past several days as well as increased frequency/urgency and mild dysuria. She has not been treated  with abx recently. She denies gross hematuria or flank pain. Her main concerns surround the decrease in performance status/safety at home and getting treatment for her suspected UTI. Otherwise, she has not complaints associated with any sequela secondary to her recent radiation treatments.  ALLERGIES:  is allergic to other, acyclovir and related, brompheniramine, chlorhexidine, dextromethorphan, monascus purpureus went yeast, phenylephrine hcl, pravastatin sodium, tegaderm ag mesh [silver], zanaflex [tizanidine hcl], codeine, keflex [cephalexin], naprosyn [naproxen], oxycodone, tramadol, and tussin [guaifenesin].  Meds: Current Outpatient Medications  Medication Sig  Dispense Refill   acetaminophen (TYLENOL) 500 MG tablet Take 500 mg by mouth every 6 (six) hours as needed.     BOOSTRIX 5-2.5-18.5 LF-MCG/0.5 injection      calcitonin, salmon, (MIACALCIN) 200 UNIT/ACT nasal spray Place 1 spray into alternate nostrils daily. 3.7 mL 12   COLLAGEN PO Take by mouth.     Cranberry 250 MG CAPS Take by mouth.     dexamethasone (DECADRON) 4 MG tablet Take 0.5 tablets (2 mg total) by mouth daily. Take one tablet (4 mg) daily (every day in the morning). On day after chemotherapy day take two tablets in morning (8mg ), then resume regular daily dose. 30 tablet 1   famotidine (PEPCID) 40 MG tablet Take 40 mg by mouth daily.     gabapentin (NEURONTIN) 600 MG tablet Take 600 mg by mouth 2 (two) times daily.     HYDROcodone-acetaminophen (NORCO/VICODIN) 5-325 MG tablet You may take 1/2 to 1 tablet every four hours as needed for pain. 120 tablet 0   lidocaine-prilocaine (EMLA) cream Apply to affected area once 30 g 3   ondansetron (ZOFRAN) 8 MG tablet Take 1 tablet (8 mg total) by mouth 2 (two) times daily as needed. Start on the third day after margetuximab chemo. 30 tablet 1   OVER THE COUNTER MEDICATION Place 1 drop into both eyes 2 (two) times daily as needed (dry eyes). Over the counter lubricating eye drops     polyethylene glycol (MIRALAX / GLYCOLAX) packet Take 17 g by mouth daily as needed.     potassium chloride SA (KLOR-CON) 20 MEQ tablet Take 1 tablet (20 mEq total) by mouth daily. 30 tablet 3   prochlorperazine (COMPAZINE) 10 MG tablet Take 1 tablet (10 mg total) by mouth every 6 (six) hours as needed (Nausea or vomiting). 30 tablet 1   spironolactone (ALDACTONE) 25 MG tablet Take 25 mg by mouth daily.     sulfamethoxazole-trimethoprim (BACTRIM DS) 800-160 MG tablet Take 1 tablet by mouth 2 (two) times daily. 10 tablet 1   tamsulosin (FLOMAX) 0.4 MG CAPS capsule Take 1 capsule (0.4 mg total) by mouth daily. 30 capsule 3   No current facility-administered  medications for this encounter.   Facility-Administered Medications Ordered in Other Encounters  Medication Dose Route Frequency Provider Last Rate Last Admin   cyanocobalamin ((VITAMIN B-12)) injection 1,000 mcg  1,000 mcg Intramuscular Q6 weeks [name], MD   1,000 mcg at 03/19/20 1110   heparin lock flush 100 unit/mL  500 Units Intracatheter Once [name], MD       sodium chloride flush (NS) 0.9 % injection 10 mL  10 mL Intracatheter Once [name], MD        Physical Findings:  vitals were not taken for this visit.   /10 Unable to assess due to telephone follow up visit format.  Lab Findings: Lab Results  Component Value Date   WBC 6.5 01/07/2021   HGB 11.2 (L) 01/07/2021   HCT  34.0 (L) 01/07/2021   MCV 91.4 01/07/2021   PLT 137 (L) 01/07/2021     Radiographic Findings: No results found.  Impression/Plan: 41. 75 yo woman with an enlarging occiput and left orbit metastasis from stage IV breast cancer. She appears to have recovered well from the effects of her recent palliative radiation to the occiput and left orbit.  We discussed the plan to obtain a restaging MRI brain in approximately 2 months to assess her response to treatment and continue to monitor for any evidence of disease recurrence or progression.  She is also scheduled for consult with Dr. Wetzel Bjornstad and Dr. Tyrone Nine at [facility] on 03/08/21 but she is not sure that she will keep these. Regarding her systemic disease, she will continue with routine follow-up for disease management under the care and direction of [name] and is scheduled for restaging imaging 01/19/21 which will likely help her make a decision as to whether she keeps the appointments at [facility] or decides to transition to more of a comfort care approach.  I will plan to follow up with the patient to review results of the MRI brain scan and discuss recommendations from brain tumr board.    Nicholos Johns, PA-C

## 2021-01-15 ENCOUNTER — Ambulatory Visit: Payer: Medicare Other | Admitting: Hematology and Oncology

## 2021-01-18 ENCOUNTER — Telehealth: Payer: Self-pay | Admitting: *Deleted

## 2021-01-18 DIAGNOSIS — C7951 Secondary malignant neoplasm of bone: Secondary | ICD-10-CM

## 2021-01-18 NOTE — Telephone Encounter (Signed)
Received call from pt with complaint of severe fatigue and confusion x2 weeks.  Pt requesting to be seen sooner then next available apt for evaluation and treatment of dehydration.  Symptom management appt scheduled and pt verbalized understanding of date and time.

## 2021-01-19 ENCOUNTER — Inpatient Hospital Stay: Payer: Medicare Other

## 2021-01-19 ENCOUNTER — Other Ambulatory Visit: Payer: Self-pay | Admitting: *Deleted

## 2021-01-19 ENCOUNTER — Inpatient Hospital Stay (HOSPITAL_BASED_OUTPATIENT_CLINIC_OR_DEPARTMENT_OTHER): Payer: Medicare Other | Admitting: Adult Health

## 2021-01-19 ENCOUNTER — Encounter: Payer: Self-pay | Admitting: Adult Health

## 2021-01-19 ENCOUNTER — Ambulatory Visit (HOSPITAL_COMMUNITY): Payer: Medicare Other

## 2021-01-19 ENCOUNTER — Ambulatory Visit: Payer: Medicare Other

## 2021-01-19 ENCOUNTER — Other Ambulatory Visit: Payer: Self-pay

## 2021-01-19 DIAGNOSIS — R0602 Shortness of breath: Secondary | ICD-10-CM | POA: Diagnosis not present

## 2021-01-19 DIAGNOSIS — R531 Weakness: Secondary | ICD-10-CM | POA: Diagnosis not present

## 2021-01-19 DIAGNOSIS — C7931 Secondary malignant neoplasm of brain: Secondary | ICD-10-CM | POA: Diagnosis not present

## 2021-01-19 DIAGNOSIS — C7951 Secondary malignant neoplasm of bone: Secondary | ICD-10-CM

## 2021-01-19 DIAGNOSIS — R52 Pain, unspecified: Secondary | ICD-10-CM | POA: Diagnosis not present

## 2021-01-19 DIAGNOSIS — C50411 Malignant neoplasm of upper-outer quadrant of right female breast: Secondary | ICD-10-CM

## 2021-01-19 DIAGNOSIS — E785 Hyperlipidemia, unspecified: Secondary | ICD-10-CM | POA: Diagnosis not present

## 2021-01-19 DIAGNOSIS — I10 Essential (primary) hypertension: Secondary | ICD-10-CM | POA: Diagnosis not present

## 2021-01-19 DIAGNOSIS — R630 Anorexia: Secondary | ICD-10-CM | POA: Diagnosis not present

## 2021-01-19 DIAGNOSIS — Z5111 Encounter for antineoplastic chemotherapy: Secondary | ICD-10-CM | POA: Diagnosis not present

## 2021-01-19 DIAGNOSIS — Z95828 Presence of other vascular implants and grafts: Secondary | ICD-10-CM | POA: Diagnosis not present

## 2021-01-19 DIAGNOSIS — R0600 Dyspnea, unspecified: Secondary | ICD-10-CM | POA: Diagnosis not present

## 2021-01-19 DIAGNOSIS — Z170 Estrogen receptor positive status [ER+]: Secondary | ICD-10-CM | POA: Diagnosis not present

## 2021-01-19 LAB — CMP (CANCER CENTER ONLY)
ALT: 38 U/L (ref 0–44)
AST: 93 U/L — ABNORMAL HIGH (ref 15–41)
Albumin: 2.8 g/dL — ABNORMAL LOW (ref 3.5–5.0)
Alkaline Phosphatase: 156 U/L — ABNORMAL HIGH (ref 38–126)
Anion gap: 11 (ref 5–15)
BUN: 30 mg/dL — ABNORMAL HIGH (ref 8–23)
CO2: 21 mmol/L — ABNORMAL LOW (ref 22–32)
Calcium: 11.5 mg/dL — ABNORMAL HIGH (ref 8.9–10.3)
Chloride: 96 mmol/L — ABNORMAL LOW (ref 98–111)
Creatinine: 0.95 mg/dL (ref 0.44–1.00)
GFR, Estimated: >60 mL/min (ref >60)
Glucose, Bld: 86 mg/dL (ref 70–99)
Potassium: 3.8 mmol/L (ref 3.5–5.1)
Sodium: 128 mmol/L — ABNORMAL LOW (ref 135–145)
Total Bilirubin: 0.6 mg/dL (ref 0.3–1.2)
Total Protein: 6.7 g/dL (ref 6.5–8.1)

## 2021-01-19 LAB — CBC WITH DIFFERENTIAL (CANCER CENTER ONLY)
Abs Immature Granulocytes: 0.42 10*3/uL — ABNORMAL HIGH (ref 0.00–0.07)
Basophils Absolute: 0 10*3/uL (ref 0.0–0.1)
Basophils Relative: 1 %
Eosinophils Absolute: 0 10*3/uL (ref 0.0–0.5)
Eosinophils Relative: 0 %
HCT: 35.2 % — ABNORMAL LOW (ref 36.0–46.0)
Hemoglobin: 11.9 g/dL — ABNORMAL LOW (ref 12.0–15.0)
Immature Granulocytes: 10 %
Lymphocytes Relative: 16 %
Lymphs Abs: 0.7 10*3/uL (ref 0.7–4.0)
MCH: 29.8 pg (ref 26.0–34.0)
MCHC: 33.8 g/dL (ref 30.0–36.0)
MCV: 88.2 fL (ref 80.0–100.0)
Monocytes Absolute: 0.2 10*3/uL (ref 0.1–1.0)
Monocytes Relative: 5 %
Neutro Abs: 2.9 10*3/uL (ref 1.7–7.7)
Neutrophils Relative %: 68 %
Platelet Count: 235 10*3/uL (ref 150–400)
RBC: 3.99 MIL/uL (ref 3.87–5.11)
RDW: 16.5 % — ABNORMAL HIGH (ref 11.5–15.5)
WBC Count: 4.3 10*3/uL (ref 4.0–10.5)
nRBC: 2.1 % — ABNORMAL HIGH (ref 0.0–0.2)

## 2021-01-19 MED ORDER — SODIUM CHLORIDE 0.9% FLUSH
10.0000 mL | Freq: Once | INTRAVENOUS | Status: AC
  Administered 2021-01-19: 10 mL

## 2021-01-19 NOTE — Progress Notes (Signed)
Per MD request, RN placed referral and intake call to [facility].

## 2021-01-19 NOTE — Progress Notes (Addendum)
[facility] Follow up:    Heather Stains, MD [place] [id number]   DIAGNOSIS: Cancer Staging Primary cancer of upper outer quadrant of right female breast [facility]) Staging form: Breast, AJCC 7th Edition - Clinical: Stage IIB (T2, N1, cM0) - Unsigned Specimen type: Core Needle Biopsy Histopathologic type: 9931 Laterality: Right Staging comments: Staged at breast conference 11.6.13  - Pathologic: Stage IV (M1) - Signed by [name], NP on 06/22/2018 Specimen type: Core Needle Biopsy Histopathologic type: 9931 Laterality: Right   SUMMARY OF ONCOLOGIC HISTORY: Oncology History  Primary cancer of upper outer quadrant of right female breast ([place])  03/08/2012 Initial Diagnosis   Right breast invasive ductal carcinoma ER positive PR negative HER-2 positive Ki-67 44%; another breast mass biopsied in the anterior part of the breast which was also positive for malignancy that was HER-2 negative   03/14/2012 Cancer Staging   Staging form: Breast, AJCC 7th Edition - Pathologic: Stage IV (M1) - Signed by [name], NP on 06/22/2018   05/10/2012 Surgery   Right mastectomy and axillary lymph node dissection: Multifocal disease 5 cm, 1.7 cm, 1.6 cm, ER positive PR negative HER-2 positive Ki-67 44%, 3/7 lymph nodes positive   06/14/2012 - 06/12/2013 Chemotherapy   Adjuvant chemotherapy with [place] 6 followed by Herceptin maintenance   10/25/2012 - 12/11/2012 Radiation Therapy   Adjuvant radiation therapy   02/07/2013 -  Anti-estrogen oral therapy   Letrozole 2.5 mg daily   02/14/2015 Imaging   MRI spine: Large destructive T5 lesion with severe pathologic compression fracture and extensive epidural tumor, severe spinal stenosis with moderate cord compression, tumor involvement of T4   03/18/2015 PET scan   Residual enhancing soft tissue adjacent to the spinal cord. No evidence of metastatic disease. Nonspecific uptake the left  nipple   03/27/2015 - 03/30/2015 Hospital Admission   T4-6 decompression for spinal cord compression (lower extremity paralysis)   04/10/2015 -  Chemotherapy   Palliative treatment with Herceptin every 3 weeks with letrozole 2.5 mg daily   04/13/2015 - 05/19/2015 Radiation Therapy   Palliative radiation treatment to the spine   09/01/2015 Imaging   CT chest abdomen pelvis: Pathologic fracture with posterior fusion at T5 no other evidence of metastatic disease in the chest abdomen pelvis   12/23/2015 Imaging   CT CAP: new nonspecific 0.6 cm lymph node was stated mediastinum needs follow-up CT, innumerable tiny groundglass pulmonary nodules throughout both lungs unchanged, patchy consolidation from radiation, T5 fracture, no mets   04/15/2016 PET scan   Rt para-spinal mass mildy hypermetabolic SUV 5.9, lytic cortical lesion 4.8 cm lesion left prox femur suv 8.8 favor osseus met disease   04/28/2016 - 05/12/2016 Radiation Therapy   Palliative XRT Left femur   05/24/2016 Procedure   Soft tissue mass biopsy back: Metastatic breast cancer ER 80%, PR 0%, Ki-67 50%, HER-2 positive ratio 2.25   06/09/2016 - 03/29/2017 Anti-estrogen oral therapy   Faslodex with Herceptin and Perjeta every 4 weeks   06/13/2016 - 06/15/2016 Hospital Admission   uncomplicated revision of previous thoracic instrumentation.   10/10/2016 PET scan   Interval development of new hypermetabolic 2.5 x 1.1 cm lesion posterior right eighth rib consistent with metastatic disease, stable right paraspinal lesion at T8, clustered soft tissue nodules in the posterior midline back near cervicothoracic junction smaller than before    11/03/2016 - 11/07/2016 Radiation Therapy   Palliative radiation to right eighth rib   11/04/2016 Imaging  Solitary contrast-enhancing lesion in the right cerebellum 6 x 2 mm concerning for metastatic lesion   11/24/2016 - 11/24/2016 Radiation Therapy   SRS to the brain lesion   04/18/2017 PET scan   New  ill-defined rounded airspace opacity in posterior right lower lobe. Differential diagnosis includes malignancy and infectious/inflammatory process.Increased hypermetabolic lytic lesion in T8 vertebral body, consistent with bone metastasis.  Increased hypermetabolic posterior upper chest wall subcutaneous nodules, highly suspicious metastatic disease. Further improvement in right posterior eighth rib metastasis.   05/04/2017 - 07/27/2017 Chemotherapy   Kadcyla every 3 weeks palliative chemotherapy stopped for progression   07/26/2017 Imaging   Interval increase in size of the multiple enhancing masses posteriorly overlying the spine, interval development of left axillary and mediastinal adenopathy, increase in the size of lytic lesions in T8-T9 and left ninth rib, increased groundglass opacities right lower lobe lung   07/27/2017 - 05/11/2018 Chemotherapy   Palbociclib + Faslodex + trastuzumab + Pertuzumab     04/03/2018 PET scan   PET CT scan showed progression of disease not only in the bones but also in the subcutaneous nodules.   05/15/2018 - 05/25/2018 Radiation Therapy   Palliative radiation to the back   06/01/2018 - 12/27/2019 Chemotherapy   Enhertu (Transtuzumab Deruxtecan)    08/07/2018 - 08/07/2018 Radiation Therapy   T12 vertebral body SRS   01/2020 - 10/23/2020 Chemotherapy    Xeloda, Tucatinib, Herceptin    11/19/2020 -  Chemotherapy    Patient is on Treatment Plan: BREAST MARGETUXIMAB + VINORELBINE Q21D       Carcinoma of right breast, stage 4 (HCC)  02/14/2015 Initial Diagnosis   Breast cancer metastasized to bone, right ([place])   06/01/2018 - 02/07/2020 Chemotherapy   The patient had palonosetron (ALOXI) injection 0.25 mg, 0.25 mg, Intravenous,  Once, 24 of 30 cycles Administration: 0.25 mg (06/01/2018), 0.25 mg (06/22/2018), 0.25 mg (08/24/2018), 0.25 mg (07/13/2018), 0.25 mg (08/03/2018), 0.25 mg (09/14/2018), 0.25 mg (10/05/2018), 0.25 mg (11/16/2018), 0.25 mg (10/26/2018), 0.25 mg  (12/07/2018), 0.25 mg (12/28/2018), 0.25 mg (01/18/2019), 0.25 mg (03/15/2019), 0.25 mg (02/15/2019), 0.25 mg (04/12/2019), 0.25 mg (05/09/2019), 0.25 mg (06/07/2019), 0.25 mg (07/05/2019), 0.25 mg (08/09/2019), 0.25 mg (09/13/2019), 0.25 mg (10/18/2019), 0.25 mg (11/22/2019), 0.25 mg (12/27/2019) fam-trastuzumab deruxtecan-nxki (ENHERTU) 332 mg in dextrose 5 % 100 mL chemo infusion, 5.4 mg/kg = 332 mg, Intravenous,  Once, 24 of 30 cycles Dose modification: 4.4 mg/kg (original dose 5.4 mg/kg, Cycle 7, Reason: Provider Judgment), 3.2 mg/kg (original dose 5.4 mg/kg, Cycle 9, Reason: Dose not tolerated), 150 mg (original dose 5.4 mg/kg, Cycle 18, Reason: Dose not tolerated) Administration: 332 mg (06/01/2018), 332 mg (06/22/2018), 332 mg (07/13/2018), 332 mg (08/24/2018), 332 mg (08/03/2018), 332 mg (09/14/2018), 272 mg (10/05/2018), 200 mg (11/16/2018), 272 mg (10/26/2018), 200 mg (12/07/2018), 200 mg (12/28/2018), 200 mg (01/18/2019), 200 mg (03/15/2019), 200 mg (02/15/2019), 200 mg (04/12/2019), 200 mg (05/09/2019), 200 mg (06/07/2019), 150 mg (07/05/2019), 150 mg (08/09/2019), 150 mg (09/13/2019), 150 mg (10/18/2019), 150 mg (11/22/2019), 150 mg (12/27/2019)   for chemotherapy treatment.     05/29/2020 - 11/13/2020 Chemotherapy    Patient is on Treatment Plan: BREAST MARGETUXIMAB + VINORELBINE Q21D       Metastatic cancer to spine [facility])  Brain metastasis ([place])  11/18/2016 Initial Diagnosis   Brain metastasis ([place])   05/29/2020 - 11/13/2020 Chemotherapy    Patient is on Treatment Plan: BREAST MARGETUXIMAB + VINORELBINE Q21D         CURRENT THERAPY: Margetuximab/Vinorelbine  INTERVAL HISTORY:  Heather [name] 75 y.o. female returns for evaluation of progressive weakness, difficulty breathing, and fatigue.  She has had a couple of falls over the past couple of weeks.  She says that when she turns around, she will hit the floor, she has lost her balance putting things on the counter. She had a UTI a couple of weeks ago that was treated by  our radiation oncology PA with Septra DS.  She has continued on this BID.  She is scheduled for CT chest/abdomen/pelvis today at 1330.  She is very tired, and says she is sometimes confused.     Patient Active Problem List   Diagnosis Date Noted   Bone metastasis ([place]) 02/02/2020   Port-A-Cath in place 07/13/2018   Goals of care, counseling/discussion 08/10/2017   Brain metastasis ([place]) 11/18/2016   Genetic testing 07/11/2016   ALLERGY TO CHLORHEXADINE, USE BETADINE ONLY 06/23/2016   Kyphosis of thoracic region 06/13/2016   Plantar fasciitis, bilateral 04/20/2016   Neuropathic pain 01/27/2016   Anemia of chronic disease 05/21/2015   Metastatic cancer to spine ([place]) 03/27/2015   Thrombocytopenia ([place]) 03/12/2015   Prerenal azotemia 03/12/2015   Pain    Recurrent UTI--due to Klebsiella    Paraplegia at T4 level ([place]) 02/20/2015   Neurogenic bowel 02/20/2015   Neurogenic bladder 02/20/2015   Postoperative anemia due to acute blood loss 02/17/2015   Spinal cord compression due to malignant neoplasm metastatic to spine ([place]) 02/15/2015   Epidural mass 02/15/2015   Thoracic spine tumor    Pathologic fracture of vertebra 02/14/2015   Pathologic fracture of thoracic vertebrae 02/14/2015   Carcinoma of right breast, stage 4 ([place]) 02/14/2015   Hyperlipidemia 02/14/2015   H. pylori infection    HTN (hypertension) 04/17/2012   Primary cancer of upper outer quadrant of right female breast ([place]) 03/08/2012    is allergic to other, acyclovir and related, brompheniramine, chlorhexidine, dextromethorphan, monascus purpureus went yeast, phenylephrine hcl, pravastatin sodium, tegaderm ag mesh [silver], zanaflex [tizanidine hcl], codeine, keflex [cephalexin], naprosyn [naproxen], oxycodone, tramadol, and tussin [guaifenesin].  MEDICAL HISTORY: Past Medical History:  Diagnosis Date   Anemia    hx of   Anxiety    r/t updated surgery   Breast cancer ([place])    right   Cataract    immature-not  sure of which eye   Complication of anesthesia    pt states she is sensitive to meds   Dizziness    has been going on for 34month;medical MD aware   Genetic testing 07/11/2016   Test Results: Normal; No pathogenic mutations detected  Genes Analyzed: 43 genes on [employer]'s Common Cancers panel (APC, ATM, AXIN2, BARD1, BMPR1A, BRCA1, BRCA2, BRIP1, CDH1, CDKN2A, CHEK2, DICER1, EPCAM, GREM1, HOXB13, KIT, MEN1, MLH1, MSH2, MSH6, MUTYH, NBN, NF1, PALB2, PDGFRA, PMS2, POLD1, POLE, PTEN, RAD50, RAD51C, RAD51D, SDHA, SDHB, SDHC, SDHD, SMAD4, SMARCA4, STK11, TP53, TSC1, TSC2, VHL).   GERD (gastroesophageal reflux disease)    Tums prn   H. pylori infection    H/O hiatal hernia    Hemorrhoids    History of UTI    Hx of radiation therapy 10/25/12- 12/11/12   right chest wall/regional lymph nodes 5040 cGy, 28 sessions, right chest wall boost 1000 cGy 1 session   Hx: UTI (urinary tract infection)    Hyperlipidemia    but not on meds;diet and exercise controlled   Hypertension    recently started Aldactone    Insomnia    takes Melatoniin daily   Metastasis to  spinal column (HCC)    T5, Left  Femur cancer- radiation    Neuropathy    "from back surgery"   PONV (postoperative nausea and vomiting)    pt experienced hair loss, confusion and combative- 02/15/15   Right knee pain    Right shoulder pain    Skin cancer    squamous, Nodule to back - "same cancer as breast."   Urinary frequency    d/t taking Aldactone    SURGICAL HISTORY: Past Surgical History:  Procedure Laterality Date   COLONOSCOPY     cyst removed from left breast  [place] 4 N/A 02/15/2015   Procedure: DECOMPRESSION T5 AND T3-T7 STABALIZATION;  Surgeon: Consuella Lose, MD;  Location: [place] [facility];  Service: Neurosurgery;  Laterality: N/A;   ESOPHAGOGASTRODUODENOSCOPY     LAMINECTOMY N/A 03/27/2015   Procedure: Thoracic Four-Thoracic Six Laminectomy for tumor;  Surgeon: Consuella Lose, MD;   Location: [place] [facility];  Service: Neurosurgery;  Laterality: N/A;  T4-T6 Laminectomy   left wrist surgery   2004   with plate   MASTECTOMY MODIFIED RADICAL  05/10/2012   Procedure: MASTECTOMY MODIFIED RADICAL;  Surgeon: Merrie Roof, MD;  Location: [place];  Service: General;  Laterality: Right;  RIGHT MODIFIED RADICAL MASTECTOMY   MASTECTOMY, RADICAL Right    MINOR BREAST BIOPSY Left 05/16/2016   Procedure: EXCISION OF BACK NODULE;  Surgeon: Autumn Messing III, MD;  Location: [place];  Service: General;  Laterality: Left;   Nodule  back  05/17/2015   PORTACATH PLACEMENT  05/10/2012   Procedure: INSERTION PORT-A-CATH;  Surgeon: Merrie Roof, MD;  Location: [place];  Service: General;  Laterality: Left;    SOCIAL HISTORY: Social History   Socioeconomic History   Marital status: Married    Spouse name: Not on file   Number of children: 2   Years of education: Not on file   Highest education level: Not on file  Occupational History   Not on file  Tobacco Use   Smoking status: Never   Smokeless tobacco: Never  Vaping Use   Vaping Use: Never used  Substance and Sexual Activity   Alcohol use: No   Drug use: No   Sexual activity: Yes    Birth control/protection: Post-menopausal  Other Topics Concern   Not on file  Social History Narrative   Not on file   Social Determinants of Health   Financial Resource Strain: Not on file  Food Insecurity: Not on file  Transportation Needs: Not on file  Physical Activity: Not on file  Stress: Not on file  Social Connections: Not on file  Intimate Partner Violence: Not on file    FAMILY HISTORY: Family History  Problem Relation Age of Onset   Leukemia Mother 85       deceased 18   Stroke Father    Diabetes Mellitus II Father    Prostate cancer Brother 91       currently 35   Stomach cancer Maternal Grandmother 81       deceased 62   Prostate cancer Brother 64       currently 13    Review of Systems  Constitutional:   Positive for appetite change, fatigue and fever. Negative for chills and unexpected weight change.  HENT:   Negative for hearing loss, lump/mass and trouble swallowing.   Eyes:  Negative for eye problems and icterus.  Respiratory:  Positive for cough and  shortness of breath. Negative for chest tightness.   Cardiovascular:  Negative for chest pain, leg swelling and palpitations.  Gastrointestinal:  Negative for abdominal distention, abdominal pain, constipation, diarrhea, nausea and vomiting.  Endocrine: Negative for hot flashes.  Genitourinary:  Negative for difficulty urinating.   Musculoskeletal:  Positive for gait problem. Negative for arthralgias.  Skin:  Negative for itching and rash.  Neurological:  Positive for dizziness, gait problem and light-headedness. Negative for extremity weakness, headaches and numbness.  Hematological:  Negative for adenopathy. Does not bruise/bleed easily.  Psychiatric/Behavioral:  Negative for depression. The patient is not nervous/anxious.      PHYSICAL EXAMINATION  ECOG PERFORMANCE STATUS: 4 - Bedbound  Vitals:   01/19/21 1023  BP: (!) 142/62  Pulse: (!) 111  Resp: 20  Temp: (!) 102.2 F (39 C)  SpO2: (!) 85%    Physical Exam Constitutional:      General: She is in acute distress.     Appearance: She is ill-appearing.  HENT:     Mouth/Throat:     Mouth: Mucous membranes are dry.  Cardiovascular:     Rate and Rhythm: Regular rhythm. Tachycardia present.  Pulmonary:     Effort: Respiratory distress present.     Comments: Placed on oxygen, 2.5L, tachypneic at 26, right lower lung diminished breath sounds Skin:    General: Skin is warm and dry.     Capillary Refill: Capillary refill takes less than 2 seconds.  Neurological:     Motor: Weakness present.     Comments: Confused conversation at times     LABORATORY DATA:  CBC    Component Value Date/Time   WBC 4.3 01/19/2021 0952   WBC 6.2 07/27/2017 0836   RBC 3.99 01/19/2021 0952    HGB 11.9 (L) 01/19/2021 0952   HGB 12.0 05/05/2017 1046   HCT 35.2 (L) 01/19/2021 0952   HCT 36.9 05/05/2017 1046   PLT 235 01/19/2021 0952   PLT 210 05/05/2017 1046   MCV 88.2 01/19/2021 0952   MCV 83.1 05/05/2017 1046   MCH 29.8 01/19/2021 0952   MCHC 33.8 01/19/2021 0952   RDW 16.5 (H) 01/19/2021 0952   RDW 14.8 (H) 05/05/2017 1046   LYMPHSABS 0.7 01/19/2021 0952   LYMPHSABS 0.6 (L) 05/05/2017 1046   MONOABS 0.2 01/19/2021 0952   MONOABS 0.3 05/05/2017 1046   EOSABS 0.0 01/19/2021 0952   EOSABS 0.1 05/05/2017 1046   BASOSABS 0.0 01/19/2021 0952   BASOSABS 0.1 05/05/2017 1046    CMP     Component Value Date/Time   NA 128 (L) 01/19/2021 0952   NA 137 05/05/2017 1046   K 3.8 01/19/2021 0952   K 3.9 05/05/2017 1046   CL 96 (L) 01/19/2021 0952   CL 101 10/25/2012 0946   CO2 21 (L) 01/19/2021 0952   CO2 26 05/05/2017 1046   GLUCOSE 86 01/19/2021 0952   GLUCOSE 88 05/05/2017 1046   GLUCOSE 90 10/25/2012 0946   BUN 30 (H) 01/19/2021 0952   BUN 14.1 05/05/2017 1046   CREATININE 0.95 01/19/2021 0952   CREATININE 0.8 05/05/2017 1046   CALCIUM 11.5 (H) 01/19/2021 0952   CALCIUM 10.3 05/05/2017 1046   PROT 6.7 01/19/2021 0952   PROT 7.2 05/05/2017 1046   ALBUMIN 2.8 (L) 01/19/2021 0952   ALBUMIN 3.7 05/05/2017 1046   AST 93 (H) 01/19/2021 0952   AST 26 05/05/2017 1046   ALT 38 01/19/2021 0952   ALT 17 05/05/2017 1046   ALKPHOS 156 (H) 01/19/2021  4782   NFAOZHY 86 05/05/2017 1046   BILITOT 0.6 01/19/2021 0952   BILITOT 0.43 05/05/2017 1046   GFRNONAA >60 01/19/2021 0952   GFRAA >60 02/07/2020 0913         ASSESSMENT and THERAPY PLAN:   Primary cancer of upper outer quadrant of right female breast ([place]) Right breast invasive ductal carcinoma ER positive PR negative HER-2 positive Ki-67 44% multifocal disease 3/7 lymph nodes positive T2 N1 M0 stage IIB status post adjuvant chemotherapy with TCH followed by Herceptin maintenance, adjuvant radiation therapy and  Letrozole. MRI 02/14/2015: T5 large destructive lesion with pathologic compression fracture and extensive epidural tumor, involvement of T4, excision metastatic carcinoma ER 60%, PR 0%, HER-2 positive ratio 2.71 average copy #7.45 03/27/2015: T4-6 decompression surgery for spinal cord compression PET-CT 04/17/16 Rt para-spinal mass mildy hypermetabolic SUV 5.9, lytic cortical lesion 4.8 cm lesion left prox femur suv 8.8 favor osseus met disease Echo 04/12/2017 shows well preserved ejection fraction of 55-60%    Treatment summary: 1. Resumed anastrozole 05/21/2015, but it has caused significant hair loss so we switched her to exemestane 25 mg once daily 07/23/2015 2. Herceptin every 3 week started 04/10/2015 3. Bone metastases: Zometa every 3 months 4. Radiation therapy to the spine (Dr. [name]) 04/13/2015 to 05/19/2015 5. Radiation therapy to left femur 04/28/2016 to 05/12/2016 6. SRS to the brain lesion 11/24/2016 7.  Kadcyla started 05/05/2017 discontinued 07/27/2017 8.Palbociclib + Faslodex + trastuzumab + Pertuzumab started 08/10/2017, stopped 04/04/2018 9. XRT to T12 07/19/2018 10. Enhertu started 06/01/18-12/27/2019 stopped for progression 11 SRS to brain January 2021 12.  Lapatinib with anastrozole stopped for progression 13.  Xeloda, Tucatinib, Herceptin 14. Margetuximab with Navelbine 11/19/2020 --------------------------------------------------------------------------------------------------------------------------  Unfortunately, Heather [name]'s condition has worsened over the past two weeks substantially.  She met with [name], and he talked with Heather [name]--her daughter in law, Heather [name]--her husband (via phone), and reviewed her poor prognosis.  He recommended hospice at this time.  We have placed a referral to hospice in [facility].  [place] agreed with this.  At first we were considering giving her a little IV fluid or zometa prior to her leaving, however she has decided to forego this.  Dr.  [name] signed the golden Do not Resuscitate order and we have scanned it and given it to the family.    All questions were answered. The patient knows to call the clinic with any problems, questions or concerns. We can certainly see the patient much sooner if necessary.  Total encounter time: 30 minutes  Wilber Bihari, NP 01/19/21 11:24 AM Medical Oncology and Hematology [facility] [place], [place] [address] Tel. [phone]    Fax. [phone]  *Total Encounter Time as defined by the Centers for Medicare and Medicaid Services includes, in addition to the face-to-face time of a patient visit (documented in the note above) non-face-to-face time: obtaining and reviewing outside history, ordering and reviewing medications, tests or procedures, care coordination (communications with other health care professionals or caregivers) and documentation in the medical record.   Attending Note  I personally saw and examined Heather [name]. The plan of care was discussed with her. I agree with the physical exam findings and assessment and plan as documented above. I performed the majority of the counseling and assessment and plan regarding this encounter Metastatic breast cancer: Dramatic and progressively worsening declining performance status. I explained to her at length about the cause of her decline in performance status possibly being related to worsening  of metastatic disease.  I do not recommend any further systemic chemotherapies. We discussed hospice care and she is willing to accept that.  I spoke to her husband by phone and her daughter was present in the room as well. Everyone is in agreement with hospice care.  [facility] signed  Signed Harriette Ohara, MD

## 2021-01-19 NOTE — Assessment & Plan Note (Signed)
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 MRI 02/14/2015: T5 large destructive lesion with pathologic compression fracture and extensive epidural tumor, involvement of T4, excision metastatic carcinoma ER 60%, PR 0%, HER-2 positive ratio 2.71 average copy #7.45 03/27/2015: T4-6 decompression surgery for spinal cord compression PET-CT 12/10/17Rt para-spinal mass mildy hypermetabolic SUV 5.9, lytic cortical lesion 4.8 cm lesion left prox femur suv 8.8 favor osseus met disease Echo 04/12/2017 shows well preserved ejection fraction of 55-60%  Treatment summary: 1. Resumed anastrozole 05/21/2015, but it has caused significant hair loss so we switched her to exemestane 25 mg once daily 07/23/2015 2. Herceptin every 3 week started 04/10/2015 3. Bone metastases: Zometa every 3 months 4. Radiation therapy to the spine (Dr. [name]) 04/13/2015 to 05/19/2015 5. Radiation therapy to left femur 04/28/2016 to 05/12/2016 6. SRS to the brain lesion 11/24/2016 7.Kadcyla started 05/05/2017 discontinued 07/27/2017 8.Palbociclib +Faslodex +trastuzumab+ Pertuzumabstarted 08/10/2017, stopped 04/04/2018 9.XRT to T123/04/2019 10. Enhertu started 06/01/18-12/27/2019 stopped for progression 11SRS to brain January 2021 12.Lapatinib with anastrozolestopped for progression 13.Xeloda, Tucatinib, Herceptin 14. Margetuximab with Navelbine 11/19/2020 --------------------------------------------------------------------------------------------------------------------------  Unfortunately, Heather [name]'s condition has worsened over the past two weeks substantially.  She met with [name], and he talked with Sally--her daughter in law, John--her husband (via phone), and reviewed her poor prognosis.   He recommended hospice at this time.  We have placed a referral to hospice in [facility].  [place] agreed with this.  At first we were considering giving her a little IV fluid or zometa prior to her leaving, however she has decided to forego this.  [name] signed the golden Do not Resuscitate order and we have scanned it and given it to the family.

## 2021-01-20 DIAGNOSIS — C50411 Malignant neoplasm of upper-outer quadrant of right female breast: Secondary | ICD-10-CM | POA: Diagnosis not present

## 2021-01-20 DIAGNOSIS — E785 Hyperlipidemia, unspecified: Secondary | ICD-10-CM | POA: Diagnosis not present

## 2021-01-20 DIAGNOSIS — C7931 Secondary malignant neoplasm of brain: Secondary | ICD-10-CM | POA: Diagnosis not present

## 2021-01-20 DIAGNOSIS — C7951 Secondary malignant neoplasm of bone: Secondary | ICD-10-CM | POA: Diagnosis not present

## 2021-01-20 DIAGNOSIS — Z95828 Presence of other vascular implants and grafts: Secondary | ICD-10-CM | POA: Diagnosis not present

## 2021-01-20 DIAGNOSIS — I10 Essential (primary) hypertension: Secondary | ICD-10-CM | POA: Diagnosis not present

## 2021-01-21 ENCOUNTER — Ambulatory Visit: Payer: Medicare Other | Admitting: Hematology and Oncology

## 2021-01-21 ENCOUNTER — Ambulatory Visit: Payer: Medicare Other

## 2021-01-21 ENCOUNTER — Other Ambulatory Visit: Payer: Medicare Other

## 2021-01-22 ENCOUNTER — Inpatient Hospital Stay: Payer: Medicare Other

## 2021-01-22 ENCOUNTER — Inpatient Hospital Stay: Payer: Medicare Other | Admitting: Hematology and Oncology

## 2021-01-22 DIAGNOSIS — I10 Essential (primary) hypertension: Secondary | ICD-10-CM | POA: Diagnosis not present

## 2021-01-22 DIAGNOSIS — C7951 Secondary malignant neoplasm of bone: Secondary | ICD-10-CM | POA: Diagnosis not present

## 2021-01-22 DIAGNOSIS — E785 Hyperlipidemia, unspecified: Secondary | ICD-10-CM | POA: Diagnosis not present

## 2021-01-22 DIAGNOSIS — C50411 Malignant neoplasm of upper-outer quadrant of right female breast: Secondary | ICD-10-CM | POA: Diagnosis not present

## 2021-01-22 DIAGNOSIS — C7931 Secondary malignant neoplasm of brain: Secondary | ICD-10-CM | POA: Diagnosis not present

## 2021-01-22 DIAGNOSIS — Z95828 Presence of other vascular implants and grafts: Secondary | ICD-10-CM | POA: Diagnosis not present

## 2021-01-25 DIAGNOSIS — C7951 Secondary malignant neoplasm of bone: Secondary | ICD-10-CM | POA: Diagnosis not present

## 2021-01-25 DIAGNOSIS — C7931 Secondary malignant neoplasm of brain: Secondary | ICD-10-CM | POA: Diagnosis not present

## 2021-01-25 DIAGNOSIS — C50411 Malignant neoplasm of upper-outer quadrant of right female breast: Secondary | ICD-10-CM | POA: Diagnosis not present

## 2021-01-25 DIAGNOSIS — E785 Hyperlipidemia, unspecified: Secondary | ICD-10-CM | POA: Diagnosis not present

## 2021-01-25 DIAGNOSIS — I10 Essential (primary) hypertension: Secondary | ICD-10-CM | POA: Diagnosis not present

## 2021-01-25 DIAGNOSIS — Z95828 Presence of other vascular implants and grafts: Secondary | ICD-10-CM | POA: Diagnosis not present

## 2021-01-27 DIAGNOSIS — E785 Hyperlipidemia, unspecified: Secondary | ICD-10-CM | POA: Diagnosis not present

## 2021-01-27 DIAGNOSIS — I10 Essential (primary) hypertension: Secondary | ICD-10-CM | POA: Diagnosis not present

## 2021-01-27 DIAGNOSIS — C50411 Malignant neoplasm of upper-outer quadrant of right female breast: Secondary | ICD-10-CM | POA: Diagnosis not present

## 2021-01-27 DIAGNOSIS — Z95828 Presence of other vascular implants and grafts: Secondary | ICD-10-CM | POA: Diagnosis not present

## 2021-01-27 DIAGNOSIS — C7951 Secondary malignant neoplasm of bone: Secondary | ICD-10-CM | POA: Diagnosis not present

## 2021-01-27 DIAGNOSIS — C7931 Secondary malignant neoplasm of brain: Secondary | ICD-10-CM | POA: Diagnosis not present

## 2021-01-28 ENCOUNTER — Other Ambulatory Visit: Payer: Medicare Other

## 2021-01-28 ENCOUNTER — Ambulatory Visit: Payer: Medicare Other | Admitting: Rehabilitation

## 2021-01-28 ENCOUNTER — Ambulatory Visit: Payer: Medicare Other

## 2021-01-28 DIAGNOSIS — C50411 Malignant neoplasm of upper-outer quadrant of right female breast: Secondary | ICD-10-CM | POA: Diagnosis not present

## 2021-01-28 DIAGNOSIS — I10 Essential (primary) hypertension: Secondary | ICD-10-CM | POA: Diagnosis not present

## 2021-01-28 DIAGNOSIS — C7951 Secondary malignant neoplasm of bone: Secondary | ICD-10-CM | POA: Diagnosis not present

## 2021-01-28 DIAGNOSIS — Z95828 Presence of other vascular implants and grafts: Secondary | ICD-10-CM | POA: Diagnosis not present

## 2021-01-28 DIAGNOSIS — C7931 Secondary malignant neoplasm of brain: Secondary | ICD-10-CM | POA: Diagnosis not present

## 2021-01-28 DIAGNOSIS — E785 Hyperlipidemia, unspecified: Secondary | ICD-10-CM | POA: Diagnosis not present

## 2021-01-29 ENCOUNTER — Other Ambulatory Visit: Payer: Medicare Other

## 2021-01-29 ENCOUNTER — Ambulatory Visit: Payer: Medicare Other

## 2021-01-29 DIAGNOSIS — C7931 Secondary malignant neoplasm of brain: Secondary | ICD-10-CM | POA: Diagnosis not present

## 2021-01-29 DIAGNOSIS — E785 Hyperlipidemia, unspecified: Secondary | ICD-10-CM | POA: Diagnosis not present

## 2021-01-29 DIAGNOSIS — Z95828 Presence of other vascular implants and grafts: Secondary | ICD-10-CM | POA: Diagnosis not present

## 2021-01-29 DIAGNOSIS — I10 Essential (primary) hypertension: Secondary | ICD-10-CM | POA: Diagnosis not present

## 2021-01-29 DIAGNOSIS — C7951 Secondary malignant neoplasm of bone: Secondary | ICD-10-CM | POA: Diagnosis not present

## 2021-01-29 DIAGNOSIS — C50411 Malignant neoplasm of upper-outer quadrant of right female breast: Secondary | ICD-10-CM | POA: Diagnosis not present

## 2021-02-01 DIAGNOSIS — C7951 Secondary malignant neoplasm of bone: Secondary | ICD-10-CM | POA: Diagnosis not present

## 2021-02-01 DIAGNOSIS — I10 Essential (primary) hypertension: Secondary | ICD-10-CM | POA: Diagnosis not present

## 2021-02-01 DIAGNOSIS — E785 Hyperlipidemia, unspecified: Secondary | ICD-10-CM | POA: Diagnosis not present

## 2021-02-01 DIAGNOSIS — Z95828 Presence of other vascular implants and grafts: Secondary | ICD-10-CM | POA: Diagnosis not present

## 2021-02-01 DIAGNOSIS — C50411 Malignant neoplasm of upper-outer quadrant of right female breast: Secondary | ICD-10-CM | POA: Diagnosis not present

## 2021-02-01 DIAGNOSIS — C7931 Secondary malignant neoplasm of brain: Secondary | ICD-10-CM | POA: Diagnosis not present

## 2021-02-03 ENCOUNTER — Ambulatory Visit: Payer: Medicare Other | Admitting: Rehabilitation

## 2021-02-03 DIAGNOSIS — I10 Essential (primary) hypertension: Secondary | ICD-10-CM | POA: Diagnosis not present

## 2021-02-03 DIAGNOSIS — E785 Hyperlipidemia, unspecified: Secondary | ICD-10-CM | POA: Diagnosis not present

## 2021-02-03 DIAGNOSIS — C50411 Malignant neoplasm of upper-outer quadrant of right female breast: Secondary | ICD-10-CM | POA: Diagnosis not present

## 2021-02-03 DIAGNOSIS — C7931 Secondary malignant neoplasm of brain: Secondary | ICD-10-CM | POA: Diagnosis not present

## 2021-02-03 DIAGNOSIS — C7951 Secondary malignant neoplasm of bone: Secondary | ICD-10-CM | POA: Diagnosis not present

## 2021-02-03 DIAGNOSIS — Z95828 Presence of other vascular implants and grafts: Secondary | ICD-10-CM | POA: Diagnosis not present

## 2021-02-04 DIAGNOSIS — C50411 Malignant neoplasm of upper-outer quadrant of right female breast: Secondary | ICD-10-CM | POA: Diagnosis not present

## 2021-02-04 DIAGNOSIS — C7931 Secondary malignant neoplasm of brain: Secondary | ICD-10-CM | POA: Diagnosis not present

## 2021-02-04 DIAGNOSIS — E785 Hyperlipidemia, unspecified: Secondary | ICD-10-CM | POA: Diagnosis not present

## 2021-02-04 DIAGNOSIS — Z95828 Presence of other vascular implants and grafts: Secondary | ICD-10-CM | POA: Diagnosis not present

## 2021-02-04 DIAGNOSIS — I10 Essential (primary) hypertension: Secondary | ICD-10-CM | POA: Diagnosis not present

## 2021-02-04 DIAGNOSIS — C7951 Secondary malignant neoplasm of bone: Secondary | ICD-10-CM | POA: Diagnosis not present

## 2021-02-05 DIAGNOSIS — Z95828 Presence of other vascular implants and grafts: Secondary | ICD-10-CM | POA: Diagnosis not present

## 2021-02-05 DIAGNOSIS — I10 Essential (primary) hypertension: Secondary | ICD-10-CM | POA: Diagnosis not present

## 2021-02-05 DIAGNOSIS — C50411 Malignant neoplasm of upper-outer quadrant of right female breast: Secondary | ICD-10-CM | POA: Diagnosis not present

## 2021-02-05 DIAGNOSIS — C7951 Secondary malignant neoplasm of bone: Secondary | ICD-10-CM | POA: Diagnosis not present

## 2021-02-05 DIAGNOSIS — C7931 Secondary malignant neoplasm of brain: Secondary | ICD-10-CM | POA: Diagnosis not present

## 2021-02-05 DIAGNOSIS — E785 Hyperlipidemia, unspecified: Secondary | ICD-10-CM | POA: Diagnosis not present

## 2021-02-17 ENCOUNTER — Encounter: Payer: Self-pay | Admitting: Hematology and Oncology

## 2021-03-09 DEATH — deceased
